# Patient Record
Sex: Female | Born: 1960 | Race: White | Hispanic: No | Marital: Married | Smoking: Current some day smoker
Health system: Southern US, Community
[De-identification: ages and names within clinical notes are randomized; demographics above are authoritative.]

## PROBLEM LIST (undated history)

## (undated) DIAGNOSIS — I214 Non-ST elevation (NSTEMI) myocardial infarction: Secondary | ICD-10-CM

## (undated) DIAGNOSIS — I480 Paroxysmal atrial fibrillation: Secondary | ICD-10-CM

## (undated) DIAGNOSIS — T7840XA Allergy, unspecified, initial encounter: Secondary | ICD-10-CM

## (undated) DIAGNOSIS — I2510 Atherosclerotic heart disease of native coronary artery without angina pectoris: Secondary | ICD-10-CM

## (undated) DIAGNOSIS — R1013 Epigastric pain: Secondary | ICD-10-CM

## (undated) DIAGNOSIS — T783XXA Angioneurotic edema, initial encounter: Secondary | ICD-10-CM

## (undated) DIAGNOSIS — J301 Allergic rhinitis due to pollen: Secondary | ICD-10-CM

## (undated) DIAGNOSIS — L509 Urticaria, unspecified: Secondary | ICD-10-CM

## (undated) DIAGNOSIS — M898X5 Other specified disorders of bone, thigh: Secondary | ICD-10-CM

## (undated) DIAGNOSIS — E785 Hyperlipidemia, unspecified: Secondary | ICD-10-CM

## (undated) DIAGNOSIS — I255 Ischemic cardiomyopathy: Secondary | ICD-10-CM

## (undated) DIAGNOSIS — J189 Pneumonia, unspecified organism: Secondary | ICD-10-CM

## (undated) DIAGNOSIS — I6529 Occlusion and stenosis of unspecified carotid artery: Secondary | ICD-10-CM

## (undated) DIAGNOSIS — K859 Acute pancreatitis without necrosis or infection, unspecified: Secondary | ICD-10-CM

## (undated) DIAGNOSIS — I714 Abdominal aortic aneurysm, without rupture, unspecified: Secondary | ICD-10-CM

## (undated) DIAGNOSIS — Z87442 Personal history of urinary calculi: Secondary | ICD-10-CM

## (undated) DIAGNOSIS — M1990 Unspecified osteoarthritis, unspecified site: Secondary | ICD-10-CM

## (undated) DIAGNOSIS — M899 Disorder of bone, unspecified: Secondary | ICD-10-CM

## (undated) DIAGNOSIS — I639 Cerebral infarction, unspecified: Secondary | ICD-10-CM

## (undated) HISTORY — DX: Allergic rhinitis due to pollen: J30.1

## (undated) HISTORY — DX: Disorder of bone, unspecified: M89.9

## (undated) HISTORY — PX: TUBAL LIGATION: SHX77

## (undated) HISTORY — DX: Ischemic cardiomyopathy: I25.5

## (undated) HISTORY — DX: Angioneurotic edema, initial encounter: T78.3XXA

## (undated) HISTORY — DX: Allergy, unspecified, initial encounter: T78.40XA

## (undated) HISTORY — DX: Atherosclerotic heart disease of native coronary artery without angina pectoris: I25.10

## (undated) HISTORY — DX: Urticaria, unspecified: L50.9

## (undated) HISTORY — DX: Hyperlipidemia, unspecified: E78.5

## (undated) HISTORY — DX: Cerebral infarction, unspecified: I63.9

## (undated) HISTORY — DX: Other specified disorders of bone, thigh: M89.8X5

## (undated) HISTORY — DX: Non-ST elevation (NSTEMI) myocardial infarction: I21.4

## (undated) HISTORY — DX: Paroxysmal atrial fibrillation: I48.0

## (undated) HISTORY — PX: CRYOABLATION: SHX1415

---
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
## 1999-05-19 ENCOUNTER — Other Ambulatory Visit: Admission: RE | Admit: 1999-05-19 | Discharge: 1999-05-19 | Payer: Self-pay | Admitting: *Deleted

## 1999-06-03 ENCOUNTER — Ambulatory Visit (HOSPITAL_COMMUNITY): Admission: RE | Admit: 1999-06-03 | Discharge: 1999-06-03 | Payer: Self-pay | Admitting: *Deleted

## 1999-06-03 ENCOUNTER — Encounter: Payer: Self-pay | Admitting: *Deleted

## 2012-04-17 ENCOUNTER — Encounter: Payer: Self-pay | Admitting: Oncology

## 2012-04-17 NOTE — Progress Notes (Signed)
[facility] is unable to fill nexvar prescription.  They faxed it to [facility] @ [phone] phone # [phone].

## 2017-08-21 ENCOUNTER — Emergency Department (HOSPITAL_COMMUNITY): Payer: Self-pay | Admitting: Anesthesiology

## 2017-08-21 ENCOUNTER — Encounter (HOSPITAL_COMMUNITY): Payer: Self-pay | Admitting: Anesthesiology

## 2017-08-21 ENCOUNTER — Inpatient Hospital Stay (HOSPITAL_COMMUNITY): Payer: Self-pay

## 2017-08-21 ENCOUNTER — Other Ambulatory Visit: Payer: Self-pay

## 2017-08-21 ENCOUNTER — Emergency Department (HOSPITAL_COMMUNITY): Payer: Self-pay

## 2017-08-21 ENCOUNTER — Encounter (HOSPITAL_COMMUNITY)
Admission: EM | Disposition: A | Discharge status: Rehabilitation Facility | Payer: Self-pay | Source: Home / Self Care | Attending: Neurology

## 2017-08-21 ENCOUNTER — Inpatient Hospital Stay (HOSPITAL_COMMUNITY)
Admission: EM | Admit: 2017-08-21 | Discharge: 2017-08-28 | DRG: 023 | Disposition: A | Discharge status: Rehabilitation Facility | Payer: Self-pay | Attending: Neurology | Admitting: Neurology

## 2017-08-21 DIAGNOSIS — R1310 Dysphagia, unspecified: Secondary | ICD-10-CM | POA: Diagnosis present

## 2017-08-21 DIAGNOSIS — D6859 Other primary thrombophilia: Secondary | ICD-10-CM | POA: Diagnosis present

## 2017-08-21 DIAGNOSIS — R29718 NIHSS score 18: Secondary | ICD-10-CM | POA: Diagnosis present

## 2017-08-21 DIAGNOSIS — G8191 Hemiplegia, unspecified affecting right dominant side: Secondary | ICD-10-CM

## 2017-08-21 DIAGNOSIS — G8101 Flaccid hemiplegia affecting right dominant side: Secondary | ICD-10-CM | POA: Diagnosis present

## 2017-08-21 DIAGNOSIS — J9600 Acute respiratory failure, unspecified whether with hypoxia or hypercapnia: Secondary | ICD-10-CM | POA: Diagnosis not present

## 2017-08-21 DIAGNOSIS — R0689 Other abnormalities of breathing: Secondary | ICD-10-CM

## 2017-08-21 DIAGNOSIS — I161 Hypertensive emergency: Secondary | ICD-10-CM | POA: Diagnosis present

## 2017-08-21 DIAGNOSIS — I10 Essential (primary) hypertension: Secondary | ICD-10-CM | POA: Diagnosis present

## 2017-08-21 DIAGNOSIS — F17210 Nicotine dependence, cigarettes, uncomplicated: Secondary | ICD-10-CM | POA: Diagnosis present

## 2017-08-21 DIAGNOSIS — D72829 Elevated white blood cell count, unspecified: Secondary | ICD-10-CM | POA: Diagnosis not present

## 2017-08-21 DIAGNOSIS — I69391 Dysphagia following cerebral infarction: Secondary | ICD-10-CM

## 2017-08-21 DIAGNOSIS — R29810 Facial weakness: Secondary | ICD-10-CM | POA: Diagnosis present

## 2017-08-21 DIAGNOSIS — I639 Cerebral infarction, unspecified: Secondary | ICD-10-CM

## 2017-08-21 DIAGNOSIS — J189 Pneumonia, unspecified organism: Secondary | ICD-10-CM

## 2017-08-21 DIAGNOSIS — I63412 Cerebral infarction due to embolism of left middle cerebral artery: Principal | ICD-10-CM | POA: Diagnosis present

## 2017-08-21 DIAGNOSIS — E871 Hypo-osmolality and hyponatremia: Secondary | ICD-10-CM | POA: Diagnosis present

## 2017-08-21 DIAGNOSIS — Z978 Presence of other specified devices: Secondary | ICD-10-CM

## 2017-08-21 DIAGNOSIS — I63512 Cerebral infarction due to unspecified occlusion or stenosis of left middle cerebral artery: Secondary | ICD-10-CM

## 2017-08-21 DIAGNOSIS — E785 Hyperlipidemia, unspecified: Secondary | ICD-10-CM | POA: Diagnosis present

## 2017-08-21 DIAGNOSIS — I63232 Cerebral infarction due to unspecified occlusion or stenosis of left carotid arteries: Secondary | ICD-10-CM | POA: Diagnosis present

## 2017-08-21 DIAGNOSIS — R4701 Aphasia: Secondary | ICD-10-CM | POA: Diagnosis present

## 2017-08-21 DIAGNOSIS — Z720 Tobacco use: Secondary | ICD-10-CM

## 2017-08-21 HISTORY — PX: IR ANGIO VERTEBRAL SEL SUBCLAVIAN INNOMINATE UNI R MOD SED: IMG5365

## 2017-08-21 HISTORY — PX: RADIOLOGY WITH ANESTHESIA: SHX6223

## 2017-08-21 HISTORY — PX: IR ANGIO INTRA EXTRACRAN SEL COM CAROTID INNOMINATE UNI R MOD SED: IMG5359

## 2017-08-21 HISTORY — PX: IR PERCUTANEOUS ART THROMBECTOMY/INFUSION INTRACRANIAL INC DIAG ANGIO: IMG6087

## 2017-08-21 LAB — COMPREHENSIVE METABOLIC PANEL
ALK PHOS: 73 U/L (ref 38–126)
ALT: 27 U/L (ref 14–54)
ANION GAP: 6 (ref 5–15)
AST: 32 U/L (ref 15–41)
Albumin: 4.5 g/dL (ref 3.5–5.0)
BILIRUBIN TOTAL: 0.4 mg/dL (ref 0.3–1.2)
BUN: 17 mg/dL (ref 6–20)
CHLORIDE: 101 mmol/L (ref 101–111)
CO2: 26 mmol/L (ref 22–32)
Calcium: 9.6 mg/dL (ref 8.9–10.3)
Creatinine, Ser: 0.69 mg/dL (ref 0.44–1.00)
GFR calc Af Amer: >60 mL/min (ref >60)
Glucose, Bld: 96 mg/dL (ref 65–99)
POTASSIUM: 3.6 mmol/L (ref 3.5–5.1)
Sodium: 133 mmol/L — ABNORMAL LOW (ref 135–145)
Total Protein: 8.3 g/dL — ABNORMAL HIGH (ref 6.5–8.1)

## 2017-08-21 LAB — I-STAT CHEM 8, ED
BUN: 18 mg/dL (ref 6–20)
CALCIUM ION: 1.21 mmol/L (ref 1.15–1.40)
CHLORIDE: 102 mmol/L (ref 101–111)
CREATININE: 0.7 mg/dL (ref 0.44–1.00)
GLUCOSE: 94 mg/dL (ref 65–99)
HCT: 52 % — ABNORMAL HIGH (ref 36.0–46.0)
Hemoglobin: 17.7 g/dL — ABNORMAL HIGH (ref 12.0–15.0)
POTASSIUM: 3.7 mmol/L (ref 3.5–5.1)
Sodium: 140 mmol/L (ref 135–145)
TCO2: 26 mmol/L (ref 22–32)

## 2017-08-21 LAB — DIFFERENTIAL
BASOS ABS: 0.1 10*3/uL (ref 0.0–0.1)
BASOS PCT: 0 %
Eosinophils Absolute: 0.1 10*3/uL (ref 0.0–0.7)
Eosinophils Relative: 1 %
LYMPHS ABS: 2.1 10*3/uL (ref 0.7–4.0)
LYMPHS PCT: 16 %
MONOS PCT: 6 %
Monocytes Absolute: 0.8 10*3/uL (ref 0.1–1.0)
NEUTROS ABS: 10.1 10*3/uL — AB (ref 1.7–7.7)
Neutrophils Relative %: 77 %

## 2017-08-21 LAB — CBC
HEMATOCRIT: 49.9 % — AB (ref 36.0–46.0)
HEMOGLOBIN: 16.2 g/dL — AB (ref 12.0–15.0)
MCH: 32 pg (ref 26.0–34.0)
MCHC: 32.5 g/dL (ref 30.0–36.0)
MCV: 98.4 fL (ref 78.0–100.0)
Platelets: 398 10*3/uL (ref 150–400)
RBC: 5.07 MIL/uL (ref 3.87–5.11)
RDW: 13.5 % (ref 11.5–15.5)
WBC: 13.2 10*3/uL — AB (ref 4.0–10.5)

## 2017-08-21 LAB — I-STAT CG4 LACTIC ACID, ED: LACTIC ACID, VENOUS: 0.65 mmol/L (ref 0.5–1.9)

## 2017-08-21 LAB — I-STAT TROPONIN, ED: TROPONIN I, POC: 0.03 ng/mL (ref 0.00–0.08)

## 2017-08-21 LAB — PROTIME-INR
INR: 0.85
Prothrombin Time: 11.5 seconds (ref 11.4–15.2)

## 2017-08-21 LAB — APTT: APTT: 41 s — AB (ref 24–36)

## 2017-08-21 LAB — ETHANOL: Alcohol, Ethyl (B): <10 mg/dL (ref <10)

## 2017-08-21 LAB — I-STAT BETA HCG BLOOD, ED (MC, WL, AP ONLY)

## 2017-08-21 SURGERY — RADIOLOGY WITH ANESTHESIA
Anesthesia: General

## 2017-08-21 MED ORDER — ROCURONIUM BROMIDE 100 MG/10ML IV SOLN
INTRAVENOUS | Status: DC | PRN
  Administered 2017-08-21 (×2): 50 mg via INTRAVENOUS

## 2017-08-21 MED ORDER — CHLORHEXIDINE GLUCONATE 0.12% ORAL RINSE (MEDLINE KIT)
15.0000 mL | Freq: Two times a day (BID) | OROMUCOSAL | Status: DC
  Administered 2017-08-22 – 2017-08-27 (×10): 15 mL via OROMUCOSAL
  Filled 2017-08-21: qty 15

## 2017-08-21 MED ORDER — ACETAMINOPHEN 325 MG PO TABS: 650.0000 mg | ORAL_TABLET | ORAL | Status: DC | PRN

## 2017-08-21 MED ORDER — ALTEPLASE (STROKE) FULL DOSE INFUSION
0.9000 mg/kg | Freq: Once | INTRAVENOUS | Status: AC
  Administered 2017-08-21: 55 mg via INTRAVENOUS

## 2017-08-21 MED ORDER — SUCCINYLCHOLINE CHLORIDE 20 MG/ML IJ SOLN
INTRAMUSCULAR | Status: DC | PRN
  Administered 2017-08-21: 120 mg via INTRAVENOUS

## 2017-08-21 MED ORDER — NICARDIPINE HCL IN NACL 20-0.86 MG/200ML-% IV SOLN
INTRAVENOUS | Status: AC
  Filled 2017-08-21: qty 200

## 2017-08-21 MED ORDER — SODIUM CHLORIDE 0.9 % IV SOLN
INTRAVENOUS | Status: DC | PRN
  Administered 2017-08-21: 20:00:00 via INTRAVENOUS

## 2017-08-21 MED ORDER — SODIUM CHLORIDE 0.9 % IV SOLN: 50.0000 mL | Freq: Once | INTRAVENOUS | Status: DC

## 2017-08-21 MED ORDER — ACETAMINOPHEN 160 MG/5ML PO SOLN: 650.0000 mg | ORAL | Status: DC | PRN

## 2017-08-21 MED ORDER — CLEVIDIPINE BUTYRATE 0.5 MG/ML IV EMUL: 0.0000 mg/h | INTRAVENOUS | Status: DC | PRN

## 2017-08-21 MED ORDER — IOPAMIDOL (ISOVUE-370) INJECTION 76%
80.0000 mL | Freq: Once | INTRAVENOUS | Status: AC | PRN
  Administered 2017-08-21: 80 mL via INTRAVENOUS

## 2017-08-21 MED ORDER — PROPOFOL 500 MG/50ML IV EMUL
INTRAVENOUS | Status: DC | PRN
  Administered 2017-08-21: 25 ug/kg/min via INTRAVENOUS

## 2017-08-21 MED ORDER — PROPOFOL 1000 MG/100ML IV EMUL
0.0000 ug/kg/min | INTRAVENOUS | Status: DC
  Administered 2017-08-21 – 2017-08-22 (×2): 50 ug/kg/min via INTRAVENOUS
  Administered 2017-08-22: 40 ug/kg/min via INTRAVENOUS
  Administered 2017-08-22: 50 ug/kg/min via INTRAVENOUS
  Administered 2017-08-23: 40 ug/kg/min via INTRAVENOUS
  Filled 2017-08-21 (×3): qty 100
  Filled 2017-08-21: qty 200
  Filled 2017-08-21: qty 100

## 2017-08-21 MED ORDER — PANTOPRAZOLE SODIUM 40 MG IV SOLR
40.0000 mg | Freq: Every day | INTRAVENOUS | Status: DC
  Administered 2017-08-22 – 2017-08-24 (×4): 40 mg via INTRAVENOUS
  Filled 2017-08-21 (×4): qty 40

## 2017-08-21 MED ORDER — STROKE: EARLY STAGES OF RECOVERY BOOK
Freq: Once | Status: AC
  Administered 2017-08-22: 1
  Filled 2017-08-21: qty 1

## 2017-08-21 MED ORDER — DOCUSATE SODIUM 50 MG/5ML PO LIQD: 100.0000 mg | Freq: Two times a day (BID) | ORAL | Status: DC | PRN

## 2017-08-21 MED ORDER — IPRATROPIUM-ALBUTEROL 0.5-2.5 (3) MG/3ML IN SOLN: 3.0000 mL | Freq: Four times a day (QID) | RESPIRATORY_TRACT | Status: DC | PRN

## 2017-08-21 MED ORDER — PROPOFOL 1000 MG/100ML IV EMUL
INTRAVENOUS | Status: AC
  Filled 2017-08-21: qty 100

## 2017-08-21 MED ORDER — ABCIXIMAB 2 MG/ML IV SOLN
10.0000 mg | INTRAVENOUS | Status: DC
  Filled 2017-08-21: qty 5

## 2017-08-21 MED ORDER — TICAGRELOR 90 MG PO TABS
180.0000 mg | ORAL_TABLET | Freq: Once | ORAL | Status: DC
  Filled 2017-08-21: qty 2

## 2017-08-21 MED ORDER — LIDOCAINE HCL (CARDIAC) 20 MG/ML IV SOLN
INTRAVENOUS | Status: DC | PRN
  Administered 2017-08-21: 100 mg via INTRATRACHEAL

## 2017-08-21 MED ORDER — ALTEPLASE 100 MG IV SOLR
INTRAVENOUS | Status: AC
  Filled 2017-08-21: qty 100

## 2017-08-21 MED ORDER — ACETAMINOPHEN 650 MG RE SUPP
650.0000 mg | RECTAL | Status: DC | PRN
  Administered 2017-08-23 (×2): 650 mg via RECTAL
  Filled 2017-08-21 (×3): qty 1

## 2017-08-21 MED ORDER — CEFAZOLIN SODIUM-DEXTROSE 2-3 GM-%(50ML) IV SOLR
INTRAVENOUS | Status: DC | PRN
  Administered 2017-08-21: 2 g via INTRAVENOUS

## 2017-08-21 MED ORDER — ORAL CARE MOUTH RINSE
15.0000 mL | Freq: Four times a day (QID) | OROMUCOSAL | Status: DC
  Administered 2017-08-22 – 2017-08-28 (×18): 15 mL via OROMUCOSAL

## 2017-08-21 MED ORDER — SODIUM CHLORIDE 0.9 % IV SOLN
INTRAVENOUS | Status: DC
  Administered 2017-08-21 – 2017-08-28 (×8): via INTRAVENOUS

## 2017-08-21 MED ORDER — BISACODYL 10 MG RE SUPP: 10.0000 mg | Freq: Every day | RECTAL | Status: DC | PRN

## 2017-08-21 MED ORDER — ONDANSETRON HCL 4 MG/2ML IJ SOLN: 4.0000 mg | Freq: Four times a day (QID) | INTRAMUSCULAR | Status: DC | PRN

## 2017-08-21 MED ORDER — ACETAMINOPHEN 650 MG RE SUPP: 650.0000 mg | RECTAL | Status: DC | PRN

## 2017-08-21 MED ORDER — NICARDIPINE HCL IN NACL 20-0.86 MG/200ML-% IV SOLN
0.0000 mg/h | INTRAVENOUS | Status: DC
  Administered 2017-08-21: 5 mg/h via INTRAVENOUS
  Administered 2017-08-22: 12.5 mg/h via INTRAVENOUS
  Administered 2017-08-22: 8 mg/h via INTRAVENOUS
  Administered 2017-08-22: 7.5 mg/h via INTRAVENOUS
  Administered 2017-08-22 (×2): 9 mg/h via INTRAVENOUS
  Administered 2017-08-22: 6 mg/h via INTRAVENOUS
  Administered 2017-08-23: 13 mg/h via INTRAVENOUS
  Administered 2017-08-23: 12.5 mg/h via INTRAVENOUS
  Administered 2017-08-23: 5 mg/h via INTRAVENOUS
  Administered 2017-08-23: 13 mg/h via INTRAVENOUS
  Filled 2017-08-21 (×12): qty 200

## 2017-08-21 MED ORDER — FENTANYL CITRATE (PF) 100 MCG/2ML IJ SOLN: 100.0000 ug | INTRAMUSCULAR | Status: DC | PRN

## 2017-08-21 MED ORDER — NITROGLYCERIN 1 MG/10 ML FOR IR/CATH LAB
INTRA_ARTERIAL | Status: AC | PRN
  Administered 2017-08-21 (×4): 25 ug via INTRA_ARTERIAL

## 2017-08-21 MED ORDER — PROPOFOL 10 MG/ML IV BOLUS
INTRAVENOUS | Status: DC | PRN
  Administered 2017-08-21: 200 mg via INTRAVENOUS
  Administered 2017-08-21: 40 mg via INTRAVENOUS

## 2017-08-21 MED ORDER — PHENYLEPHRINE HCL 10 MG/ML IJ SOLN
INTRAMUSCULAR | Status: DC | PRN
  Administered 2017-08-21: 25 ug/min via INTRAVENOUS

## 2017-08-21 MED ORDER — SODIUM CHLORIDE 0.9 % IV SOLN: INTRAVENOUS | Status: DC

## 2017-08-21 MED ORDER — FENTANYL CITRATE (PF) 250 MCG/5ML IJ SOLN
INTRAMUSCULAR | Status: DC | PRN
  Administered 2017-08-21 (×2): 100 ug via INTRAVENOUS

## 2017-08-21 NOTE — Sedation Documentation (Signed)
Reopro delivered to the IR suite.

## 2017-08-21 NOTE — Sedation Documentation (Signed)
Dr. Estanislado Pandy requesting Reopro 10mg /10ml. Called Pharmacy and spoke with Clarene Critchley, PhD. She states that there in none available on this campus, but there is one vital on the [place]. She states that she will go to [facility] and get it and that it will take a minimum of 30 mins to get it to the IR suite.

## 2017-08-21 NOTE — ED Notes (Signed)
No initial vitals .  Pt straight to ct before room

## 2017-08-21 NOTE — ED Notes (Signed)
Pt arrival via EMS.  Dr. Alvino Chapel met pt in hallway.  Pt not moving right side.  Unable to gaze to the right side.  Speech garbled.  Pt straight to CT before going to room.  CBG 105.  Per EMS pt has no history and no meds.  LKW was 1530 today.  Pt found in the floor at 1715.  PT did c/o headache this morning.

## 2017-08-21 NOTE — H&P (Signed)
Neurology H&P   CC: Right-sided weakness  History is obtained from: Husband  HPI: [name] is a 56 y.o. female with no significant past medical history (has not been to a physician in 15 years) who presents with right-sided weakness that started sometime between 3:15 and 5:15 PM.   Her husband left her around 3:15 PM and then her brother found her on the floor at 5:15 PM.  She was taken to any pain hospital where she was administered IV TPA.  CTA was performed which demonstrated  LKW: 3:15 PM tpa given?:  Yes Modified Rankin Score: 0  ROS: A 14 point ROS was performed and is negative except as noted in the HPI.   Family history: Unable to obtain due to altered mental status  Social History: Husband states that she smokes, does not drink  Exam: Current vital signs: BP (!) 144/71   Pulse 86   Temp 98.4 F (36.9 C)   Resp 15   Ht 5\' 2"  (1.575 m)   Wt 60.8 kg (134 lb)   LMP  (LMP Unknown)   SpO2 94%   BMI 24.51 kg/m  Vital signs in last 24 hours: Temp:  [98.4 F (36.9 C)] 98.4 F (36.9 C) (11/26 1929) Pulse Rate:  [86-99] 86 (11/26 1929) Resp:  [13-22] 15 (11/26 1929) BP: (129-157)/(71-83) 144/71 (11/26 1929) SpO2:  [94 %-98 %] 94 % (11/26 1929) Weight:  [60.8 kg (134 lb)] 60.8 kg (134 lb) (11/26 1901)  Physical Exam  Constitutional: Appears well-developed and well-nourished.  Psych: Affect appropriate to situation Eyes: No scleral injection HENT: No OP obstrucion Head: Normocephalic.  Cardiovascular: Normal rate and regular rhythm.  Respiratory: Effort normal and breath sounds normal to anterior ascultation GI: Soft.  No distension. There is no tenderness.  Skin: WDI  Neuro: Mental Status: Patient is awake, alert, she is able to tell me her name, but not answer any other questions other than yes/no questions.  This the answers that she gets to yes/no questions are of dubious reliability as well.   Cranial Nerves: II: Does not blink to threat from the  right.  Pupils are equal, round, and reactive to light.   III,IV, VI: Left gaze preference V: Facial sensation is symmetric (dubious reliability) VII: Facial movement is weak on the right Motor: She has a flaccid right hemiparesis, with no movement in the right arm in flexion to noxious stimuli in the right leg.  She moves her left side voluntarily with full strength. Sensory: She does flinch to noxious stimuli, but this is reduced on the right side. 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  She received IV TPA at [facility].  She is within the 6-hour window for IR intervention, and she is being taken directly to the Cath Lab.  Recommendations: 1.  Attempt for mechanical thrombectomy 2. MRI  of the brain without contrast 3. Frequent neuro checks 4. Echocardiogram 5. HgbA1c, fasting lipid panel 6. Prophylactic therapy-none for 24 hours unless stent is used 7. Risk factor modification 8. Telemetry monitoring 9. PT consult, OT consult, Speech consult 10. please page stroke NP  Or  PA  Or MD  from 8am -4 pm as this patient will be followed by the  stroke team at this point.   You can look them up on [email or web address]      This patient is critically ill and at significant risk of neurological worsening, death and care requires constant monitoring of vital signs, hemodynamics,respiratory and cardiac monitoring, neurological assessment, discussion with family, other specialists and medical decision making of high complexity. I spent 50 minutes of neurocritical care time  in the care of  this patient.  Roland Rack, MD [facility] [phone]  If 7pm- 7am, please  page neurology on call as listed in [place]. 08/21/2017  9:25 PM

## 2017-08-21 NOTE — Sedation Documentation (Signed)
Pt taken to Room 4N22 on stretcher with monitor, RN, CRNA and RT. Groin and pulses assessed at bedside with receiving RN, Rich. See flowsheet.

## 2017-08-21 NOTE — Sedation Documentation (Signed)
Ct tech unable to find order for non contrast heat CT. Sherita in patient placement called to help with encounter. She could not find a problem. She could see all of the orders. I spoke with the registation clerk in the ED, Draius, to help with resolving  the problem and he was unable to help. Pt had head CT without and order by the direction of Dr. Estanislado Pandy and he said he would assume all responsibility for doing so.

## 2017-08-21 NOTE — Transfer of Care (Signed)
Immediate Anesthesia Transfer of Care Note  Patient: [name]  Procedure(s) Performed: RADIOLOGY WITH ANESTHESIA (N/A )  Patient Location: PACU  Anesthesia Type:General  Level of Consciousness: sedated  Airway & Oxygen Therapy: Patient remains intubated per anesthesia plan and Patient placed on Ventilator (see vital sign flow sheet for setting)  Post-op Assessment: Report given to RN and Post -op Vital signs reviewed and stable  Post vital signs: Reviewed and stable  Last Vitals:  Vitals:   08/21/17 1945 08/21/17 2000  BP: 131/72 (!) 145/85  Pulse: (!) 104 97  Resp: 17 16  Temp: (!) 36 C (!) 36.1 C  SpO2: 96% 97%    Last Pain:  Vitals:   08/21/17 2000  TempSrc: Oral         Complications: No apparent anesthesia complications

## 2017-08-21 NOTE — Progress Notes (Signed)
Patient transported on vent from CT to 0K-34 without complication.

## 2017-08-21 NOTE — Progress Notes (Signed)
CALL FROM RN Purcell EXAM FINISHED 1829 IMAGES SENT TO Effort 1830 EXAM COMPLETED IN Epic CALLED [place] [phone]

## 2017-08-21 NOTE — Anesthesia Procedure Notes (Signed)
Procedure Name: Intubation Date/Time: 08/21/2017 8:24 PM Performed by: Clovis Cao, CRNA Pre-anesthesia Checklist: Patient identified, Emergency Drugs available, Suction available, Patient being monitored and Timeout performed Patient Re-evaluated:Patient Re-evaluated prior to induction Oxygen Delivery Method: Circle system utilized Preoxygenation: Pre-oxygenation with 100% oxygen Induction Type: IV induction, Rapid sequence and Cricoid Pressure applied Laryngoscope Size: McGraph and 3 (Elective Mc Grath) Grade View: Grade I Tube type: Subglottic suction tube Tube size: 7.5 mm Number of attempts: 1 Airway Equipment and Method: Stylet Placement Confirmation: ETT inserted through vocal cords under direct vision,  positive ETCO2 and breath sounds checked- equal and bilateral Secured at: 22 cm Tube secured with: Tape Dental Injury: Teeth and Oropharynx as per pre-operative assessment

## 2017-08-21 NOTE — Consult Note (Signed)
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 The risks/benefits/alternatives of IV tpa, including a 6 percent risk of brain bleed and 3 percent risk of death, was reviewed with the patient's husband, and he consented to IV tpa. Patient will be transferred to [facility] for potential endovascular treatment - discussed with Dr. Leonel Ramsay.  Differential Diagnosis:  1. Cardioembolic stroke  2. Small vessel disease/lacune  3. Thromboembolic, artery-to-artery mechanism  4. Hypercoagulable state-related infarct  5. Transient ischemic attack  6. Thrombotic mechanism, large artery disease   Comments:   Door time:  1830 [facility] contacted: [phone] [facility] at bedside: 1856 NIHSS assessment time: 1903 Verbal tpa order: 1905 Needle time: 1918  Verbal Consent to tPA:  I have explained to the patient/family/guardian the nature of the patient's condition, the use of tPA fibrinolytic agent, and the benefits to be reasonably expected compared with alternative approaches. I have discussed the likelihood of major risks or complications of this procedure including (if applicable) but not limited to loss of limb function, brain damage, paralysis, hemorrhage, infection, complications from transfusion of blood components, drug reactions, blood clots and loss of life. I have also indicated that with any procedure there is always the possibility of an unexpected complication. I have explained the risks which include:    1. Death, Stroke or permanent neurologic injury (paralysis, coma, etc)   2. Worsening of stroke symptoms from swelling or bleeding in the brain   3. Bleeding in other parts of the body   4. Need for blood transfusions to replace blood or clotting factors   5. Allergic reaction to medications   6. Other unexpected complications    All questions were answered and the patient/family/guardian express understanding  of the treatment plan and consent to the procedure.  Our recommendations are outlined below.   We will be seeing the patient back in follow up as noted.    Recommendations:   IV tPA - dose = 5.5 mg bolus; 49.3 mg infusion Routine post tPA monitoring including neuro checks and blood pressure control during/after treatment Monitor blood pressure Check blood pressure and NIHSS every 15 min for 2 h, then every 30 min for 6 h, and finally every hour for 16 h  Systolic greater than 836 OR diastolic greater than 629: Option 1: Labetalol 10 mg IV for 1 - 2 min May repeat or double labetalol every 10 min to maximum dose of 300 mg, or give initial labetalol dose, then start labetalol drip at 2 - 8 mg/min. Option 2: Nicardipine 5 mg/h IV infusion as initial dose and titrate to desired effect by increasing 2.5 mg/h every 5 min to maximum of 15 mg/h;  If blood pressure is not controlled by labetolol or nicardipine, consider sodium nitroprusside.  Admission to ICU CT brain 24 hours post tPA NPO until swallowing screen performed and passed No antiplatelet agents or anticoagulants (including heparin for DVT prophylaxis) in first 24 hours No Foley catheter, nasogastric tube, arterial catheter or central venous catheter for 24 hr, unless absolutely necessary Telemetry  Inpatient Neurology Consultation Stroke evaluation as per inpatient neurology recommendations Discussed with ED MD  -----------------------------------------------------------------------------------------  CC right weakness and difficulty speaking.  History of Present Illness   Patient is a 56 yo woman with acute onset right weakness and difficulty speaking. She was LSN at approx 1530 and she was found at approx 1730. No LOC/convulsion.   Diagnostic: CT head neg for hemorrhage.  Exam:  RESULT SUMMARY: 18 points NIH Stroke Scale   INPUTS: 1A: Level of consciousness -> 0 = Alert; keenly responsive 1B: Ask month and age -> 2  = 0 questions right  1C: 'Blink eyes' & 'squeeze hands' -> 1 = Performs 1 task 2: Horizontal extraocular movements -> 0 = Normal 3: Visual fields -> 0 = No visual loss 4: Facial palsy -> 2 = Partial paralysis (lower face) 5A: Left arm motor drift -> 0 = No drift for 10 seconds 5B: Right arm motor drift -> 4 = No movement 6A: Left leg motor drift -> 0 = No drift for 5 seconds 6B: Right leg motor drift -> 4 = No movement 7: Limb Ataxia -> 0 = No ataxia 8: Sensation -> 0 = Normal; no sensory loss 9: Language/aphasia -> 3 = Mute/global aphasia: no usable speech/auditory comprehension 10: Dysarthria -> 2 = Mute/anarthric 11: Extinction/inattention -> 0 = No abnormality    Medical Decision Making:  - Extensive number of diagnosis or management options are considered above.   - Extensive amount of complex data reviewed.   - High risk of complication and/or morbidity or mortality are associated with differential diagnostic considerations above.  - There may be Uncertain outcome and increased probability of prolonged functional impairment or high probability of severe prolonged functional impairment associated with some of these differential diagnosis.  Medical Data Reviewed:  1.Data reviewed include clinical labs, radiology,  Medical Tests;   2.Tests results discussed w/performing or interpreting physician;   3.Obtaining/reviewing old medical records;  4.Obtaining case history from another source;  5.Independent review of image, tracing or specimen.    Patient was informed the Neurology Consult would happen via telehealth (remote video) and consented to receiving care in this manner.

## 2017-08-21 NOTE — Sedation Documentation (Signed)
Procedure end

## 2017-08-21 NOTE — ED Notes (Signed)
Pt arrived in IR at [facility]. Care transferred at this time.

## 2017-08-21 NOTE — ED Triage Notes (Signed)
Pt found in the floor with right side flaccid and unable to talk Last seen normal at 1530

## 2017-08-21 NOTE — ED Provider Notes (Signed)
[facility] EMERGENCY DEPARTMENT Provider Note   CSN: [id number] Arrival date & time: 08/21/17  1830     History   Chief Complaint Chief Complaint  Patient presents with  . Code Stroke    HPI [name] is a 56 y.o. female.  HPI Level 5 caveat due to nonverbal status.  This patient was brought in a code stroke.  Last seen normal by her husband at 46 when he went out hunting.  Came back and found her on the floor.  Not moving her right side and not speaking.  He came home around 515.  Around 545 EMS was called.  I met her upon arrival and she is minimally verbal and will questionably follow any commands.  Neglect of the right side and flaccid on the right.  Reportedly no significant medical history.  CBG was read around 100. No past medical history on file.  There are no active problems to display for this patient.   Patient is nonverbal and unable to get history from her.  OB History    No data available       Home Medications    Prior to Admission medications   Not on File    Family History No family history on file.  Social History Social History   Tobacco Use  . Smoking status: Not on file  Substance Use Topics  . Alcohol use: Not on file  . Drug use: Not on file     Allergies   Patient has no known allergies.   Review of Systems Review of Systems  Unable to perform ROS: Patient nonverbal     Physical Exam Updated Vital Signs BP (!) 141/76   Pulse 95   Temp 98.4 F (36.9 C) (Oral)   Resp 18   Ht 5' 2"  (1.575 m)   Wt 60.8 kg (134 lb)   LMP  (LMP Unknown)   SpO2 98%   BMI 24.51 kg/m   Physical Exam  Constitutional: She appears well-developed.  HENT:  Head: Normocephalic.  Neck: Neck supple.  Cardiovascular: Normal rate.  Pulmonary/Chest: Effort normal. She has no wheezes.  Abdominal: There is no tenderness.  Musculoskeletal: She exhibits no edema.  Neurological:  Patient is awake and breathing spontaneously.  Moving  left side spontaneously but flaccid on right.  Neglect of right side and will not look past the midline.  Will say a few words but will only question will follow commands.  Would not touch her face to command but did raise her arm to command.  Did move toes on left side.  Right-sided facial droop.  Skin: Skin is warm. Capillary refill takes less than 2 seconds.     ED Treatments / Results  Labs (all labs ordered are listed, but only abnormal results are displayed) Labs Reviewed  APTT - Abnormal; Notable for the following components:      Result Value   aPTT 41 (*)    All other components within normal limits  CBC - Abnormal; Notable for the following components:   WBC 13.2 (*)    Hemoglobin 16.2 (*)    HCT 49.9 (*)    All other components within normal limits  DIFFERENTIAL - Abnormal; Notable for the following components:   Neutro Abs 10.1 (*)    All other components within normal limits  COMPREHENSIVE METABOLIC PANEL - Abnormal; Notable for the following components:   Sodium 133 (*)    Total Protein 8.3 (*)    All other  components within normal limits  I-STAT CHEM 8, ED - Abnormal; Notable for the following components:   Hemoglobin 17.7 (*)    HCT 52.0 (*)    All other components within normal limits  ETHANOL  PROTIME-INR  RAPID URINE DRUG SCREEN, HOSP PERFORMED  URINALYSIS, ROUTINE W REFLEX MICROSCOPIC  I-STAT TROPONIN, ED  I-STAT BETA HCG BLOOD, ED ([facility], [facility], [facility] ONLY)  I-STAT CG4 LACTIC ACID, ED    EKG  EKG Interpretation  Date/Time:  Monday August 21 2017 18:50:19 EST Ventricular Rate:  99 PR Interval:    QRS Duration: 86 QT Interval:  373 QTC Calculation: 479 R Axis:   84 Text Interpretation:  Sinus rhythm Consider right atrial enlargement Borderline repolarization abnormality Baseline wander in lead(s) V6 Confirmed by Davonna Belling [phone]) on 08/21/2017 7:28:17 PM       Radiology Ct Angio Head W Or Wo Contrast  Result Date: 08/21/2017 CLINICAL DATA:   Left MCA syndrome.  Abnormal head CT. EXAM: CT ANGIOGRAPHY HEAD AND NECK TECHNIQUE: Multidetector CT imaging of the head and neck was performed using the standard protocol during bolus administration of intravenous contrast. Multiplanar CT image reconstructions and MIPs were obtained to evaluate the vascular anatomy. Carotid stenosis measurements (when applicable) are obtained utilizing NASCET criteria, using the distal internal carotid diameter as the denominator. CONTRAST:  71m ISOVUE-370 IOPAMIDOL (ISOVUE-370) INJECTION 76% COMPARISON:  None. FINDINGS: CTA NECK FINDINGS Aortic arch: Extensive atheromatous thickening and calcification of the arch, especially for age. Low-density plaque or luminal thrombus seen at the isthmus. There is degradation by motion artifact. Right carotid system: Atheromatous plaque on the ventral distal common carotid. There is mildly irregular plaque at the ICA bulb with 40% proximal ICA stenosis. Negative for dissection. Left carotid system: Motion affects visualization of the proximal common carotid. There is atheromatous wall thickening of the ventral distal common carotid wall. Plaque at the common carotid bifurcation and ICA bulb with severe stenosis at the ICA bulb, with downstream underfilling. This is a likely source of embolism. No superimposed dissection. Vertebral arteries: Motion affects visualization of the proximal subclavian artery on the left. There is likely mild ostial stenosis. No visible left vertebral flow until the V2 segment. There is strong dominance of the right vertebral artery without superimposed flow limiting stenosis. There is scattered atherosclerotic plaque on the right vertebral artery. Skeleton: No acute or aggressive finding. Other neck: No incidental mass or adenopathy. Upper chest: No acute finding Review of the MIP images confirms the above findings CTA HEAD FINDINGS Anterior circulation: Faint flow seen in the left proximal intracranial ICA, with  none by the level of the anterior genu. Occluded left MCA with distal opacification beginning at the M2/3 level. There is a robust anterior communicating artery and good bilateral A2 and distal flow. No right-sided branch occlusion is seen. Posterior circulation: Diminutive left vertebral artery, especially before the PICA. The distal left V4 segment may be flow in retrograde. Negative for branch occlusion or flow limiting stenosis. Venous sinuses: As permitted by contrast timing, patent. Anatomic variants: None significant Delayed phase: Not performed in the emergent setting. See head CT for record of communication. Review of the MIP images confirms the above findings IMPRESSION: 1. Positive for emergent large vessel occlusion. No flow seen in the left M1 and distal intracranial LICA. There is flow seen in the left M2/3 branches suggesting favorable collaterals. Aspects scored on prior noncontrast head CT. 2. Severe atheromatous stenosis at the left ICA bulb which may be an embolic  source. There is comparatively poor flow between the stenosis and intracranial occlusion. 3. ~ 40% right ICA bulb atheromatous narrowing. 4. 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 There is good flow in the distal left V4 segment and left PICA, which may be retrograde. 5. Advanced atherosclerosis of the aortic arch with irregular plaque/thrombus at the isthmus. Motion affects visualization of the great vessel ostia. Electronically Signed   By: Monte Fantasia M.D.   On: 08/21/2017 19:03   Ct Angio Neck W Or Wo Contrast  Result Date: 08/21/2017 CLINICAL DATA:  Left MCA syndrome.  Abnormal head CT. EXAM: CT ANGIOGRAPHY HEAD AND NECK TECHNIQUE: Multidetector CT imaging of the head and neck was performed using the standard protocol during bolus administration of intravenous contrast. Multiplanar CT image reconstructions and MIPs were obtained to evaluate the vascular anatomy. Carotid stenosis  measurements (when applicable) are obtained utilizing NASCET criteria, using the distal internal carotid diameter as the denominator. CONTRAST:  63m ISOVUE-370 IOPAMIDOL (ISOVUE-370) INJECTION 76% COMPARISON:  None. FINDINGS: CTA NECK FINDINGS Aortic arch: Extensive atheromatous thickening and calcification of the arch, especially for age. Low-density plaque or luminal thrombus seen at the isthmus. There is degradation by motion artifact. Right carotid system: Atheromatous plaque on the ventral distal common carotid. There is mildly irregular plaque at the ICA bulb with 40% proximal ICA stenosis. Negative for dissection. Left carotid system: Motion affects visualization of the proximal common carotid. There is atheromatous wall thickening of the ventral distal common carotid wall. Plaque at the common carotid bifurcation and ICA bulb with severe stenosis at the ICA bulb, with downstream underfilling. This is a likely source of embolism. No superimposed dissection. Vertebral arteries: Motion affects visualization of the proximal subclavian artery on the left. There is likely mild ostial stenosis. No visible left vertebral flow until the V2 segment. There is strong dominance of the right vertebral artery without superimposed flow limiting stenosis. There is scattered atherosclerotic plaque on the right vertebral artery. Skeleton: No acute or aggressive finding. Other neck: No incidental mass or adenopathy. Upper chest: No acute finding Review of the MIP images confirms the above findings CTA HEAD FINDINGS Anterior circulation: Faint flow seen in the left proximal intracranial ICA, with none by the level of the anterior genu. Occluded left MCA with distal opacification beginning at the M2/3 level. There is a robust anterior communicating artery and good bilateral A2 and distal flow. No right-sided branch occlusion is seen. Posterior circulation: Diminutive left vertebral artery, especially before the PICA. The distal  left V4 segment may be flow in retrograde. Negative for branch occlusion or flow limiting stenosis. Venous sinuses: As permitted by contrast timing, patent. Anatomic variants: None significant Delayed phase: Not performed in the emergent setting. See head CT for record of communication. Review of the MIP images confirms the above findings IMPRESSION: 1. Positive for emergent large vessel occlusion. No flow seen in the left M1 and distal intracranial LICA. There is flow seen in the left M2/3 branches suggesting favorable collaterals. Aspects scored on prior noncontrast head CT. 2. Severe atheromatous stenosis at the left ICA bulb which may be an embolic source. There is comparatively poor flow between the stenosis and intracranial occlusion. 3. ~ 40% right ICA bulb atheromatous narrowing. 4. 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 There is good flow in the distal left V4 segment and left PICA, which may be retrograde. 5. Advanced atherosclerosis of the aortic arch with irregular plaque/thrombus at  the isthmus. Motion affects visualization of the great vessel ostia. Electronically Signed   By: Monte Fantasia M.D.   On: 08/21/2017 19:03   Ct Head Code Stroke Wo Contrast  Result Date: 08/21/2017 CLINICAL DATA:  Code stroke.  Code stroke.  Right-sided weakness. EXAM: CT HEAD WITHOUT CONTRAST TECHNIQUE: Contiguous axial images were obtained from the base of the skull through the vertex without intravenous contrast. COMPARISON:  None. FINDINGS: Brain: There is low-density in the posterior left putamen and posterior left insula. Small area of left parietal cortex blurring. Negative for hemorrhage. No masslike finding or hydrocephalus. No prior injury is noted. Vascular: Hyperdense left distal ICA and MCA. Atherosclerotic calcification. Skull: No acute finding. Sinuses/Orbits: Gaze to the left. Other: Critical Value/emergent results were called by telephone at the time of  interpretation on 08/21/2017 at 6:43 pm to Dr. Davonna Belling , who verbally acknowledged these results. ASPECTS [facility] Stroke Program Early CT Score) - Ganglionic level infarction (caudate, lentiform nuclei, internal capsule, insula, M1-M3 cortex): 5 - Supraganglionic infarction (M4-M6 cortex): 2 Total score (0-10 with 10 being normal): 7 IMPRESSION: Hyperdense distal left ICA and M1 segment consistent with thrombus in this setting. Small acute infarcts seen in the left insula, left putamen, and possibly left parietal cortex ASPECTS is 7 to 8. Electronically Signed   By: Monte Fantasia M.D.   On: 08/21/2017 18:46    Procedures Procedures (including critical care time)  Medications Ordered in ED Medications  alteplase (ACTIVASE) 1 mg/mL injection (not administered)  alteplase (ACTIVASE) 1 mg/mL infusion 55 mg (55 mg Intravenous New Bag/Given 08/21/17 1918)    Followed by  0.9 %  sodium chloride infusion (not administered)  iopamidol (ISOVUE-370) 76 % injection 80 mL (80 mLs Intravenous Contrast Given 08/21/17 1839)     Initial Impression / Assessment and Plan / ED Course  I have reviewed the triage vital signs and the nursing notes.  Pertinent labs & imaging results that were available during my care of the patient were reviewed by me and considered in my medical decision making (see chart for details).     Patient presented as a code stroke.  Worrisome for large vessel occlusion with the deficits.  CT and CTA done emergently.  Shows large vessel occlusion.  Seen by specialist on-call neurologist.  IV TPA given.  Complete NIH scoring done by neurology.  Will transfer down to [facility] for possible vascular intervention.  I discussed with Dr. Jeneen Rinks and Dr. Leonel Ramsay.   CRITICAL CARE Performed by: Davonna Belling Total critical care time: 36mnutes Critical care time was exclusive of separately billable procedures and treating other patients. Critical care was necessary  to treat or prevent imminent or life-threatening deterioration. Critical care was time spent personally by me on the following activities: development of treatment plan with patient and/or surrogate as well as nursing, discussions with consultants, evaluation of patient's response to treatment, examination of patient, obtaining history from patient or surrogate, ordering and performing treatments and interventions, ordering and review of laboratory studies, ordering and review of radiographic studies, pulse oximetry and re-evaluation of patient's condition.   Final Clinical Impressions(s) / ED Diagnoses   Final diagnoses:  Cerebrovascular accident (CVA) due to occlusion of left middle cerebral artery ([facility]    ED Discharge Orders    None       PDavonna Belling MD 08/21/17 1929

## 2017-08-21 NOTE — Procedures (Signed)
S/P bilateral common carotid and RT vert arteriograms,followed by complete revascularization of occluded Lt ICA terminus ,Lt MCA M! And M2 segs with x1 pass with solitaire 33mm x 40 mm FR  retrieval device achieving TICI 3 repetfusion.

## 2017-08-21 NOTE — Sedation Documentation (Signed)
Anesthesia case 

## 2017-08-21 NOTE — Anesthesia Procedure Notes (Signed)
Arterial Line Insertion Start/End11/26/2018 8:15 PM, 08/21/2017 8:22 PM Performed by: Clovis Cao, CRNA, CRNA  Preanesthetic checklist: patient identified, IV checked, site marked, risks and benefits discussed, surgical consent, monitors and equipment checked, pre-op evaluation and timeout performed Left, radial was placed Catheter size: 20 G Hand hygiene performed  and maximum sterile barriers used  Allen's test indicative of satisfactory collateral circulation Attempts: 1 Procedure performed without using ultrasound guided technique. Ultrasound Notes:anatomy identified, needle tip was noted to be adjacent to the nerve/plexus identified and no ultrasound evidence of intravascular and/or intraneural injection Following insertion, dressing applied. Patient tolerated the procedure well with no immediate complications.

## 2017-08-21 NOTE — Consult Note (Signed)
PULMONARY / CRITICAL CARE MEDICINE   Name: [name] MRN: [id number] DOB: 1960/11/22    ADMISSION DATE:  08/21/2017 CONSULTATION DATE:  08/21/2017  REFERRING MD:  Dr. Leonel Ramsay   CHIEF COMPLAINT:  CVA  HISTORY OF PRESENT ILLNESS:   56 year old right handed female with no significant PMH/ has not seen physican in 15 years who presented to [facility] with acute onset of aphasia, right sided weakness and left gaze 11/26.  LSW at 1515.  CT head negative for ICH however CTA showed left MCA and left ICA occlusion.  TPA given at [place]. Transferred to [facility] for endovascular revascularization.  Intubated for procedure.  CCM consulted for ventilator management.   PAST MEDICAL HISTORY :  She  has no past medical history on file.  PAST SURGICAL HISTORY: She  has no past surgical history on file.  No Known Allergies  No current facility-administered medications on file prior to encounter.    No current outpatient medications on file prior to encounter.    FAMILY HISTORY:  Her has no family status information on file.    SOCIAL HISTORY: Unknown  REVIEW OF SYSTEMS:   Unable to complete as patient is intubated and sedated on mechanical ventilation.  SUBJECTIVE:  On propofol at 150 mcg/kg/min  VITAL SIGNS: BP (!) 148/91   Pulse 100   Temp (!) 97 F (36.1 C) (Oral)   Resp 18   Ht 5\' 2"  (1.575 m)   Wt 134 lb (60.8 kg)   LMP  (LMP Unknown)   SpO2 100%   BMI 24.51 kg/m   HEMODYNAMICS:    VENTILATOR SETTINGS: Vent Mode: PRVC FiO2 (%):  [100 %] 100 % Set Rate:  [16 bmp] 16 bmp Vt Set:  [400 mL] 400 mL PEEP:  [5 cmH20] 5 cmH20 Plateau Pressure:  [16 cmH20] 16 cmH20  PRVC TV 400, RR 16, peep 5, FiO2 80% PIP 19  INTAKE / OUTPUT: No intake/output data recorded.  PHYSICAL EXAMINATION: General:  Well nourished adult female, sedated on MV HEENT: MM pink/moist, ETT/ OGT, pupils 3/ sluggish Neuro: Sedated, no response CV:  rrr, no m/r/g, faint DP pulses, left groin  site level 0 PULM: even/non-labored on MV, lungs bilaterally clear GI: sofft, bs active  Extremities: cool/dry, no edema  Skin: no rashes, small bruising to right knee  LABS:  BMET Recent Labs  Lab 08/21/17 1829 08/21/17 1843  NA 133* 140  K 3.6 3.7  CL 101 102  CO2 26  --   BUN 17 18  CREATININE 0.69 0.70  GLUCOSE 96 94    Electrolytes Recent Labs  Lab 08/21/17 1829  CALCIUM 9.6    CBC Recent Labs  Lab 08/21/17 1829 08/21/17 1843  WBC 13.2*  --   HGB 16.2* 17.7*  HCT 49.9* 52.0*  PLT 398  --     Coag's Recent Labs  Lab 08/21/17 1829  APTT 41*  INR 0.85    Sepsis Markers Recent Labs  Lab 08/21/17 1839  LATICACIDVEN 0.65    ABG No results for input(s): PHART, PCO2ART, PO2ART in the last 168 hours.  Liver Enzymes Recent Labs  Lab 08/21/17 1829  AST 32  ALT 27  ALKPHOS 73  BILITOT 0.4  ALBUMIN 4.5    Cardiac Enzymes No results for input(s): TROPONINI, PROBNP in the last 168 hours.  Glucose No results for input(s): GLUCAP in the last 168 hours.  Imaging Ct Angio Head W Or Wo Contrast  Result Date: 08/21/2017 CLINICAL DATA:  Left MCA syndrome.  Abnormal head CT. EXAM: CT ANGIOGRAPHY HEAD AND NECK TECHNIQUE: Multidetector CT imaging of the head and neck was performed using the standard protocol during bolus administration of intravenous contrast. Multiplanar CT image reconstructions and MIPs were obtained to evaluate the vascular anatomy. Carotid stenosis measurements (when applicable) are obtained utilizing NASCET criteria, using the distal internal carotid diameter as the denominator. CONTRAST:  70mL ISOVUE-370 IOPAMIDOL (ISOVUE-370) INJECTION 76% COMPARISON:  None. FINDINGS: CTA NECK FINDINGS Aortic arch: Extensive atheromatous thickening and calcification of the arch, especially for age. Low-density plaque or luminal thrombus seen at the isthmus. There is degradation by motion artifact. Right carotid system: Atheromatous plaque on the  ventral distal common carotid. There is mildly irregular plaque at the ICA bulb with 40% proximal ICA stenosis. Negative for dissection. Left carotid system: Motion affects visualization of the proximal common carotid. There is atheromatous wall thickening of the ventral distal common carotid wall. Plaque at the common carotid bifurcation and ICA bulb with severe stenosis at the ICA bulb, with downstream underfilling. This is a likely source of embolism. No superimposed dissection. Vertebral arteries: Motion affects visualization of the proximal subclavian artery on the left. There is likely mild ostial stenosis. No visible left vertebral flow until the V2 segment. There is strong dominance of the right vertebral artery without superimposed flow limiting stenosis. There is scattered atherosclerotic plaque on the right vertebral artery. Skeleton: No acute or aggressive finding. Other neck: No incidental mass or adenopathy. Upper chest: No acute finding Review of the MIP images confirms the above findings CTA HEAD FINDINGS Anterior circulation: Faint flow seen in the left proximal intracranial ICA, with none by the level of the anterior genu. Occluded left MCA with distal opacification beginning at the M2/3 level. There is a robust anterior communicating artery and good bilateral A2 and distal flow. No right-sided branch occlusion is seen. Posterior circulation: Diminutive left vertebral artery, especially before the PICA. The distal left V4 segment may be flow in retrograde. Negative for branch occlusion or flow limiting stenosis. Venous sinuses: As permitted by contrast timing, patent. Anatomic variants: None significant Delayed phase: Not performed in the emergent setting. See head CT for record of communication. Review of the MIP images confirms the above findings IMPRESSION: 1. Positive for emergent large vessel occlusion. No flow seen in the left M1 and distal intracranial LICA. There is flow seen in the left  M2/3 branches suggesting favorable collaterals. Aspects scored on prior noncontrast head CT. 2. Severe atheromatous stenosis at the left ICA bulb which may be an embolic source. There is comparatively poor flow between the stenosis and intracranial occlusion. 3. ~ 40% right ICA bulb atheromatous narrowing. 4. 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 There is good flow in the distal left V4 segment and left PICA, which may be retrograde. 5. Advanced atherosclerosis of the aortic arch with irregular plaque/thrombus at the isthmus. Motion affects visualization of the great vessel ostia. Electronically Signed   By: Monte Fantasia M.D.   On: 08/21/2017 19:03   Ct Angio Neck W Or Wo Contrast  Result Date: 08/21/2017 CLINICAL DATA:  Left MCA syndrome.  Abnormal head CT. EXAM: CT ANGIOGRAPHY HEAD AND NECK TECHNIQUE: Multidetector CT imaging of the head and neck was performed using the standard protocol during bolus administration of intravenous contrast. Multiplanar CT image reconstructions and MIPs were obtained to evaluate the vascular anatomy. Carotid stenosis measurements (when applicable) are obtained utilizing NASCET criteria, using the distal  internal carotid diameter as the denominator. CONTRAST:  58mL ISOVUE-370 IOPAMIDOL (ISOVUE-370) INJECTION 76% COMPARISON:  None. FINDINGS: CTA NECK FINDINGS Aortic arch: Extensive atheromatous thickening and calcification of the arch, especially for age. Low-density plaque or luminal thrombus seen at the isthmus. There is degradation by motion artifact. Right carotid system: Atheromatous plaque on the ventral distal common carotid. There is mildly irregular plaque at the ICA bulb with 40% proximal ICA stenosis. Negative for dissection. Left carotid system: Motion affects visualization of the proximal common carotid. There is atheromatous wall thickening of the ventral distal common carotid wall. Plaque at the common carotid bifurcation  and ICA bulb with severe stenosis at the ICA bulb, with downstream underfilling. This is a likely source of embolism. No superimposed dissection. Vertebral arteries: Motion affects visualization of the proximal subclavian artery on the left. There is likely mild ostial stenosis. No visible left vertebral flow until the V2 segment. There is strong dominance of the right vertebral artery without superimposed flow limiting stenosis. There is scattered atherosclerotic plaque on the right vertebral artery. Skeleton: No acute or aggressive finding. Other neck: No incidental mass or adenopathy. Upper chest: No acute finding Review of the MIP images confirms the above findings CTA HEAD FINDINGS Anterior circulation: Faint flow seen in the left proximal intracranial ICA, with none by the level of the anterior genu. Occluded left MCA with distal opacification beginning at the M2/3 level. There is a robust anterior communicating artery and good bilateral A2 and distal flow. No right-sided branch occlusion is seen. Posterior circulation: Diminutive left vertebral artery, especially before the PICA. The distal left V4 segment may be flow in retrograde. Negative for branch occlusion or flow limiting stenosis. Venous sinuses: As permitted by contrast timing, patent. Anatomic variants: None significant Delayed phase: Not performed in the emergent setting. See head CT for record of communication. Review of the MIP images confirms the above findings IMPRESSION: 1. Positive for emergent large vessel occlusion. No flow seen in the left M1 and distal intracranial LICA. There is flow seen in the left M2/3 branches suggesting favorable collaterals. Aspects scored on prior noncontrast head CT. 2. Severe atheromatous stenosis at the left ICA bulb which may be an embolic source. There is comparatively poor flow between the stenosis and intracranial occlusion. 3. ~ 40% right ICA bulb atheromatous narrowing. 4. No flow seen in the congenitally  diminutive proximal left vertebral artery. There is good flow in the distal left V4 segment and left PICA, which may be retrograde. 5. Advanced atherosclerosis of the aortic arch with irregular plaque/thrombus at the isthmus. Motion affects visualization of the great vessel ostia. Electronically Signed   By: Monte Fantasia M.D.   On: 08/21/2017 19:03   Ct Head Code Stroke Wo Contrast  Result Date: 08/21/2017 CLINICAL DATA:  Code stroke.  Code stroke.  Right-sided weakness. EXAM: CT HEAD WITHOUT CONTRAST TECHNIQUE: Contiguous axial images were obtained from the base of the skull through the vertex without intravenous contrast. COMPARISON:  None. FINDINGS: Brain: There is low-density in the posterior left putamen and posterior left insula. Small area of left parietal cortex blurring. Negative for hemorrhage. No masslike finding or hydrocephalus. No prior injury is noted. Vascular: Hyperdense left distal ICA and MCA. Atherosclerotic calcification. Skull: No acute finding. Sinuses/Orbits: Gaze to the left. Other: Critical Value/emergent results were called by telephone at the time of interpretation on 08/21/2017 at 6:43 pm to Dr. Davonna Belling , who verbally acknowledged these results. ASPECTS [facility] Stroke Program Early CT Score) -  Ganglionic level infarction (caudate, lentiform nuclei, internal capsule, insula, M1-M3 cortex): 5 - Supraganglionic infarction (M4-M6 cortex): 2 Total score (0-10 with 10 being normal): 7 IMPRESSION: Hyperdense distal left ICA and M1 segment consistent with thrombus in this setting. Small acute infarcts seen in the left insula, left putamen, and possibly left parietal cortex ASPECTS is 7 to 8. Electronically Signed   By: Monte Fantasia M.D.   On: 08/21/2017 18:46   STUDIES:  CT head 11/26 >>  Hyperdense distal left ICA and M1 segment consistent with thrombus in this setting. Small acute infarcts seen in the left insula, left putamen, and possibly left parietal cortex  ASPECTS is 7 to 8.  CTA head/neck 11/26 >>  1. Positive for emergent large vessel occlusion. No flow seen in the left M1 and distal intracranial LICA. There is flow seen in the left M2/3 branches suggesting favorable collaterals. Aspects scored on prior noncontrast head CT. 2. Severe atheromatous stenosis at the left ICA bulb which may be an embolic source. There is comparatively poor flow between the stenosis and intracranial occlusion. 3. ~ 40% right ICA bulb atheromatous narrowing. 4. 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 There is good flow in the distal left V4 segment and left PICA, which may be retrograde. 5. Advanced atherosclerosis of the aortic arch with irregular plaque/thrombus at the isthmus. Motion affects visualization of the great vessel ostia  CT head (post EVR) 11/26 >>  CULTURES: MRSA PCR 11/26 >>  ANTIBIOTICS: none  SIGNIFICANT EVENTS: 11/26  Admit  LINES/TUBES: PIV x 2 ETT 11/26 >> OGT 11/26 >> Left fem sheath 11/26 >> Left radial Aline 11/26 >>  DISCUSSION: 53 yoF with no known PMH (not seen by MD > 15 yrs) presents with aphasia, right sided weakness, and left gaze found to have acute L MCA and L ICA occulusion.  Treated with TPA and taken for EVR with mechanical thrombectomy with complete revascularization.  ASSESSMENT / PLAN:  PULMONARY A: Acute respiratory insufficency- in the setting of EVR/ CVA P:   Full MV support, 8 cc/kg CXR and ABG now VAP protocol SBT if meets criteria in am, hold extubation till after sheath removed  SLP after extubation  Duoneb prn   CARDIOVASCULAR A:  CVA - neg troponin, EKG nonacute - lactic acid 0.65 P:  Tele monitoring Cardene for goal SBP 120-140 Continue Aline Sheath per Neuro Trend troponin/ EKG in am Assess TTE Lipid panel per stroke workup   RENAL A:   No acute issues P:   Assess mag/ phos Trend BMP / urinary output/ daily weights Replace  electrolytes as indicated Avoid nephrotoxic agents, ensure adequate renal perfusion  GASTROINTESTINAL A:   NPO - normal LFTs P:   OGT PPI for SUP Bowel regimen for PAD protocol  HEMATOLOGIC A:   Leukocytosis- mild, likely reactive Elevated H/H P:  Trend CBC No anticoagulation x 24 hr s/p TPA HIV ordered   INFECTIOUS A:   Leukocytosis-  P:   Assess CXR and UA Trend WBC / fever control Monitor clinically for now  ENDOCRINE A:   No acute issues P:   CBG q 4hr Assess TSH and HgbA1c  NEUROLOGIC A:   Acute L MCA and L ICA occulusion s/p TPA and EVR with complete revascularization s/p mechanical thrombectomy  - presented with aphasia, right sided weakness and left gaze P:   RASS goal: -1/-2 PAD protocol with propofol and prn fentanyl Assess triglycerides Stroke team primary service  Further  imaging per Stroke team - MRI scheduled for am Pending CT head s/p procedure Strict SBP management as above  UDS pending SLP/ PT/ OT consults when appropriate    FAMILY  - Updates: No family at bedside 11/26.    - Inter-disciplinary family meet or Palliative Care meeting due by: 08/28/2017  CCT 45 mins   Kennieth Rad, AGACNP- [facility] Pulmonary and [place] Pager: [phone]  08/21/2017, 11:34 PM

## 2017-08-21 NOTE — Anesthesia Preprocedure Evaluation (Addendum)
Anesthesia Evaluation  Patient identified by MRN, date of birth, ID band Patient unresponsive    Reviewed: Unable to perform ROS - Chart review onlyPreop documentation limited or incomplete due to emergent nature of procedure.  Airway Mallampati: II  TM Distance: >3 FB Neck ROM: Full    Dental  (+) Teeth Intact   Pulmonary    Pulmonary exam normal        Cardiovascular Normal cardiovascular exam     Neuro/Psych CVA    GI/Hepatic   Endo/Other    Renal/GU      Musculoskeletal   Abdominal   Peds  Hematology   Anesthesia Other Findings   Reproductive/Obstetrics                            Anesthesia Physical Anesthesia Plan  ASA: IV and emergent  Anesthesia Plan: General   Post-op Pain Management:    Induction: Intravenous, Rapid sequence and Cricoid pressure planned  PONV Risk Score and Plan: 3 and Ondansetron and Treatment may vary due to age or medical condition  Airway Management Planned: Oral ETT  Additional Equipment: Arterial line  Intra-op Plan:   Post-operative Plan: Post-operative intubation/ventilation  Informed Consent: I have reviewed the patients History and Physical, chart, labs and discussed the procedure including the risks, benefits and alternatives for the proposed anesthesia with the patient or authorized representative who has indicated his/her understanding and acceptance.   History available from chart only and Only emergency history available  Plan Discussed with: CRNA and Surgeon  Anesthesia Plan Comments:        Anesthesia Quick Evaluation

## 2017-08-21 NOTE — Sedation Documentation (Signed)
Patient placement called for bed assignment. Spoke with [employer]. Pt will be going to room 4N22.  Bed teletracked to come to IR2.

## 2017-08-21 NOTE — Progress Notes (Signed)
Patient ID: [name], female   DOB: 03-07-1961, 56 y.o.   MRN: [id number] INR . 56 yr old rt handed female with acute onset of aphasia ,rt sided weakness and lt gaze deviation .LSN at 330pm. CT brain No ICH.INH 18.Premorbid  Modified rankin 0 ASPECTS 7.  CTA LT MCA and Lt ICA occlusion. IV TPA bolus dose 55 mg initiated. Option of endovascular revascularization to prevent further neurological injury discussed with patients husband. The procedure,risks ,benefits alternatives all reviewed. Risks of ICH of 10 to 15 %,worsening neurologic function ,vent dependency,death and inability to revascularize all discussed in detail. Questions answered. Informed witnessed consent for endovascular revascularization  under [place] obtained.[name]  S.Laylaa Guevarra MD

## 2017-08-22 ENCOUNTER — Inpatient Hospital Stay (HOSPITAL_COMMUNITY): Payer: Self-pay

## 2017-08-22 ENCOUNTER — Encounter (HOSPITAL_COMMUNITY): Payer: Self-pay | Admitting: Interventional Radiology

## 2017-08-22 ENCOUNTER — Other Ambulatory Visit: Payer: Self-pay | Admitting: Radiology

## 2017-08-22 DIAGNOSIS — I63512 Cerebral infarction due to unspecified occlusion or stenosis of left middle cerebral artery: Secondary | ICD-10-CM

## 2017-08-22 DIAGNOSIS — I639 Cerebral infarction, unspecified: Secondary | ICD-10-CM

## 2017-08-22 DIAGNOSIS — I340 Nonrheumatic mitral (valve) insufficiency: Secondary | ICD-10-CM

## 2017-08-22 DIAGNOSIS — Z978 Presence of other specified devices: Secondary | ICD-10-CM

## 2017-08-22 DIAGNOSIS — R0689 Other abnormalities of breathing: Secondary | ICD-10-CM

## 2017-08-22 LAB — TSH: TSH: 1.086 u[IU]/mL (ref 0.350–4.500)

## 2017-08-22 LAB — BLOOD GAS, ARTERIAL
Acid-base deficit: 2.5 mmol/L — ABNORMAL HIGH (ref 0.0–2.0)
Acid-base deficit: 3.2 mmol/L — ABNORMAL HIGH (ref 0.0–2.0)
Bicarbonate: 22.4 mmol/L (ref 20.0–28.0)
Bicarbonate: 22.5 mmol/L (ref 20.0–28.0)
DRAWN BY: 418751
Drawn by: 398991
FIO2: 40
FIO2: 80
MECHVT: 400 mL
MECHVT: 400 mL
O2 Saturation: 95.8 %
O2 Saturation: 96 %
PEEP: 5 cmH2O
PEEP: 5 cmH2O
PH ART: 7.283 — AB (ref 7.350–7.450)
Patient temperature: 97.1
Patient temperature: 98.6
RATE: 16 resp/min
RATE: 16 resp/min
pCO2 arterial: 42 mmHg (ref 32.0–48.0)
pCO2 arterial: 49 mmHg — ABNORMAL HIGH (ref 32.0–48.0)
pH, Arterial: 7.344 — ABNORMAL LOW (ref 7.350–7.450)
pO2, Arterial: 82.6 mmHg — ABNORMAL LOW (ref 83.0–108.0)
pO2, Arterial: 97.9 mmHg (ref 83.0–108.0)

## 2017-08-22 LAB — ECHOCARDIOGRAM COMPLETE
Height: 62 in
Weight: 2144 oz

## 2017-08-22 LAB — GLUCOSE, CAPILLARY
GLUCOSE-CAPILLARY: 107 mg/dL — AB (ref 65–99)
GLUCOSE-CAPILLARY: 141 mg/dL — AB (ref 65–99)
GLUCOSE-CAPILLARY: 148 mg/dL — AB (ref 65–99)
Glucose-Capillary: 140 mg/dL — ABNORMAL HIGH (ref 65–99)
Glucose-Capillary: 124 mg/dL — ABNORMAL HIGH (ref 65–99)
Glucose-Capillary: 132 mg/dL — ABNORMAL HIGH (ref 65–99)
Glucose-Capillary: 113 mg/dL — ABNORMAL HIGH (ref 65–99)

## 2017-08-22 LAB — SEDIMENTATION RATE: SED RATE: 10 mm/h (ref 0–22)

## 2017-08-22 LAB — BASIC METABOLIC PANEL
Anion gap: 7 (ref 5–15)
BUN: 13 mg/dL (ref 6–20)
CHLORIDE: 107 mmol/L (ref 101–111)
CO2: 23 mmol/L (ref 22–32)
CREATININE: 0.58 mg/dL (ref 0.44–1.00)
Calcium: 8.3 mg/dL — ABNORMAL LOW (ref 8.9–10.3)
GFR calc non Af Amer: >60 mL/min (ref >60)
Glucose, Bld: 148 mg/dL — ABNORMAL HIGH (ref 65–99)
POTASSIUM: 3.8 mmol/L (ref 3.5–5.1)
Sodium: 137 mmol/L (ref 135–145)

## 2017-08-22 LAB — CBC WITH DIFFERENTIAL/PLATELET
BASOS PCT: 0 %
Basophils Absolute: 0 10*3/uL (ref 0.0–0.1)
Eosinophils Absolute: 0 10*3/uL (ref 0.0–0.7)
Eosinophils Relative: 0 %
HEMATOCRIT: 42.9 % (ref 36.0–46.0)
HEMOGLOBIN: 14.3 g/dL (ref 12.0–15.0)
LYMPHS ABS: 1.3 10*3/uL (ref 0.7–4.0)
Lymphocytes Relative: 8 %
MCH: 31.8 pg (ref 26.0–34.0)
MCHC: 33.3 g/dL (ref 30.0–36.0)
MCV: 95.5 fL (ref 78.0–100.0)
MONOS PCT: 7 %
Monocytes Absolute: 1.1 10*3/uL — ABNORMAL HIGH (ref 0.1–1.0)
NEUTROS ABS: 14.7 10*3/uL — AB (ref 1.7–7.7)
NEUTROS PCT: 85 %
Platelets: 368 10*3/uL (ref 150–400)
RBC: 4.49 MIL/uL (ref 3.87–5.11)
RDW: 13.8 % (ref 11.5–15.5)
WBC: 17.1 10*3/uL — AB (ref 4.0–10.5)

## 2017-08-22 LAB — URINALYSIS, ROUTINE W REFLEX MICROSCOPIC
BILIRUBIN URINE: NEGATIVE
Glucose, UA: NEGATIVE mg/dL
Hgb urine dipstick: NEGATIVE
KETONES UR: NEGATIVE mg/dL
Leukocytes, UA: NEGATIVE
NITRITE: NEGATIVE
PROTEIN: NEGATIVE mg/dL
Specific Gravity, Urine: 1.014 (ref 1.005–1.030)
pH: 5 (ref 5.0–8.0)

## 2017-08-22 LAB — TROPONIN I: Troponin I: 0.03 ng/mL (ref <0.03)

## 2017-08-22 LAB — HEMOGLOBIN A1C
Hgb A1c MFr Bld: 5.5 % (ref 4.8–5.6)
Mean Plasma Glucose: 111.15 mg/dL

## 2017-08-22 LAB — LIPID PANEL
Cholesterol: 237 mg/dL — ABNORMAL HIGH (ref 0–200)
HDL: 35 mg/dL — AB (ref >40)
LDL CALC: 143 mg/dL — AB (ref 0–99)
TRIGLYCERIDES: 295 mg/dL — AB (ref <150)
Total CHOL/HDL Ratio: 6.8 RATIO
VLDL: 59 mg/dL — ABNORMAL HIGH (ref 0–40)

## 2017-08-22 LAB — MAGNESIUM: Magnesium: 1.9 mg/dL (ref 1.7–2.4)

## 2017-08-22 LAB — RAPID URINE DRUG SCREEN, HOSP PERFORMED
Amphetamines: NOT DETECTED
BARBITURATES: NOT DETECTED
Benzodiazepines: POSITIVE — AB
COCAINE: NOT DETECTED
OPIATES: NOT DETECTED
Tetrahydrocannabinol: NOT DETECTED

## 2017-08-22 LAB — PHOSPHORUS: Phosphorus: 3.7 mg/dL (ref 2.5–4.6)

## 2017-08-22 LAB — HIV ANTIBODY (ROUTINE TESTING W REFLEX): HIV Screen 4th Generation wRfx: NONREACTIVE

## 2017-08-22 LAB — MRSA PCR SCREENING: MRSA by PCR: NEGATIVE

## 2017-08-22 LAB — TRIGLYCERIDES: TRIGLYCERIDES: 301 mg/dL — AB (ref <150)

## 2017-08-22 MED ORDER — MIDAZOLAM HCL 2 MG/2ML IJ SOLN
1.0000 mg | INTRAMUSCULAR | Status: DC | PRN
  Administered 2017-08-22 – 2017-08-23 (×3): 2 mg via INTRAVENOUS
  Filled 2017-08-22 (×3): qty 2

## 2017-08-22 MED ORDER — FENTANYL CITRATE (PF) 100 MCG/2ML IJ SOLN
25.0000 ug | INTRAMUSCULAR | Status: DC | PRN
  Administered 2017-08-22 (×3): 100 ug via INTRAVENOUS
  Administered 2017-08-22 (×2): 50 ug via INTRAVENOUS
  Administered 2017-08-23: 100 ug via INTRAVENOUS
  Filled 2017-08-22 (×6): qty 2

## 2017-08-22 MED ORDER — NICOTINE 14 MG/24HR TD PT24
14.0000 mg | MEDICATED_PATCH | Freq: Every day | TRANSDERMAL | Status: DC | PRN
  Filled 2017-08-22: qty 1

## 2017-08-22 MED ORDER — SODIUM CHLORIDE 0.9 % IV SOLN
0.0000 ug/min | INTRAVENOUS | Status: DC
  Filled 2017-08-22: qty 1

## 2017-08-22 MED ORDER — ATORVASTATIN CALCIUM 80 MG PO TABS
80.0000 mg | ORAL_TABLET | Freq: Every day | ORAL | Status: DC
  Administered 2017-08-22 – 2017-08-27 (×4): 80 mg via ORAL
  Filled 2017-08-22 (×4): qty 1

## 2017-08-22 NOTE — Progress Notes (Signed)
OT Cancellation Note  Patient Details Name: [name] MRN: [id number] DOB: [birth year]   Cancelled Treatment:    Reason Eval/Treat Not Completed: Patient not medically ready. Pt with strict bedrest orders and intubated/sedated. Will await increase in activity orders prior to initiation of evaluation.   Norman Herrlich, MS OTR/L  Pager: [phone]   Norman Herrlich 08/22/2017, 6:51 AM

## 2017-08-22 NOTE — Progress Notes (Signed)
  Echocardiogram 2D Echocardiogram has been performed.  Merrie Roof F 08/22/2017, 4:00 PM

## 2017-08-22 NOTE — Progress Notes (Signed)
36fr sheath pulled from left groin using manual pressure and v-pad at 1240 hrs.  Hemostasis achieved at 1300 hrs. Left groin site reviewed with Margart Sickles.  Distal pulses palpable.  No complications.  [name] Rt-R CV [employer] Rt-R

## 2017-08-22 NOTE — Progress Notes (Signed)
PULMONARY / CRITICAL CARE MEDICINE   Name: [name] MRN: [id number] DOB: 24-Dec-1960    ADMISSION DATE:  08/21/2017   REFERRING MD:  Dr. Leonel Ramsay  CHIEF COMPLAINT:  stroke  HISTORY OF PRESENT ILLNESS:   56 year old right handed female with no significant PMH/ has not seen physican in 15 years who presented to [facility] with acute onset of aphasia, right sided weakness and left gaze 11/26.  LSW at 1515.  CT head negative for ICH however CTA showed left MCA and left ICA occlusion.  TPA given at [place]. Transferred to [facility] for endovascular revascularization.  Intubated for procedure.  CCM consulted for ventilator management.     PAST MEDICAL HISTORY :  She  has no past medical history on file.  PAST SURGICAL HISTORY: She  has no past surgical history on file.  No Known Allergies  No current facility-administered medications on file prior to encounter.    No current outpatient medications on file prior to encounter.    SUBJECTIVE:  Patient intubated, sedated. RASS of -2. On 50ug/kg/min of propofol.  VITAL SIGNS: BP (!) 142/58   Pulse 93   Temp (!) 97.1 F (36.2 C) (Axillary)   Resp 16   Ht 5\' 2"  (1.575 m)   Wt 60.8 kg (134 lb)   LMP  (LMP Unknown)   SpO2 100%   BMI 24.51 kg/m   HEMODYNAMICS:    VENTILATOR SETTINGS: Vent Mode: PRVC FiO2 (%):  [40 %-100 %] 40 % Set Rate:  [16 bmp] 16 bmp Vt Set:  [400 mL] 400 mL PEEP:  [5 cmH20] 5 cmH20 Plateau Pressure:  [13 cmH20-16 cmH20] 13 cmH20  INTAKE / OUTPUT: I/O last 3 completed shifts: In: 2101 [I.V.:1601; Blood:500] Out: 1875 [NWGNF:6213; Blood:200]  PHYSICAL EXAMINATION: General:  Sedated. Neuro:  RASS of -2. Can not adequately assess motor function. HEENT:  PERL. ETT, OGT. No jvd or icterus. Cardiovascular:  Normal s1s2, regular rhythm. No murmur or gallop. Lungs:  Bilateral breath sounds. Clear to auscultation. No wheezes, crackles or rhonchi. Abdomen:  Flat. No muscle guarding. No  organomegaly. Musculoskeletal:  +2 pulses, right radial, bilateral DP's. Left femoral arterial site bandaged. Left radial artery catheter. Skin:  No ecchymoses. No edema.  LABS:  BMET Recent Labs  Lab 08/21/17 1829 08/21/17 1843  NA 133* 140  K 3.6 3.7  CL 101 102  CO2 26  --   BUN 17 18  CREATININE 0.69 0.70  GLUCOSE 96 94    Electrolytes Recent Labs  Lab 08/21/17 1829  CALCIUM 9.6    CBC Recent Labs  Lab 08/21/17 1829 08/21/17 1843 08/22/17 0811  WBC 13.2*  --  17.1*  HGB 16.2* 17.7* 14.3  HCT 49.9* 52.0* 42.9  PLT 398  --  368    Coag's Recent Labs  Lab 08/21/17 1829  APTT 41*  INR 0.85    Sepsis Markers Recent Labs  Lab 08/21/17 1839  LATICACIDVEN 0.65    ABG Recent Labs  Lab 08/21/17 0008  PHART 7.283*  PCO2ART 49.0*  PO2ART 97.9    Liver Enzymes Recent Labs  Lab 08/21/17 1829  AST 32  ALT 27  ALKPHOS 73  BILITOT 0.4  ALBUMIN 4.5    Cardiac Enzymes No results for input(s): TROPONINI, PROBNP in the last 168 hours.  Glucose Recent Labs  Lab 08/22/17 0020 08/22/17 0403 08/22/17 0801  GLUCAP 107* 140* 148*    Imaging Ct Angio Head W Or Wo Contrast  Result Date: 08/21/2017  CLINICAL DATA:  Left MCA syndrome.  Abnormal head CT. EXAM: CT ANGIOGRAPHY HEAD AND NECK TECHNIQUE: Multidetector CT imaging of the head and neck was performed using the standard protocol during bolus administration of intravenous contrast. Multiplanar CT image reconstructions and MIPs were obtained to evaluate the vascular anatomy. Carotid stenosis measurements (when applicable) are obtained utilizing NASCET criteria, using the distal internal carotid diameter as the denominator. CONTRAST:  47mL ISOVUE-370 IOPAMIDOL (ISOVUE-370) INJECTION 76% COMPARISON:  None. FINDINGS: CTA NECK FINDINGS Aortic arch: Extensive atheromatous thickening and calcification of the arch, especially for age. Low-density plaque or luminal thrombus seen at the isthmus. There is  degradation by motion artifact. Right carotid system: Atheromatous plaque on the ventral distal common carotid. There is mildly irregular plaque at the ICA bulb with 40% proximal ICA stenosis. Negative for dissection. Left carotid system: Motion affects visualization of the proximal common carotid. There is atheromatous wall thickening of the ventral distal common carotid wall. Plaque at the common carotid bifurcation and ICA bulb with severe stenosis at the ICA bulb, with downstream underfilling. This is a likely source of embolism. No superimposed dissection. Vertebral arteries: Motion affects visualization of the proximal subclavian artery on the left. There is likely mild ostial stenosis. No visible left vertebral flow until the V2 segment. There is strong dominance of the right vertebral artery without superimposed flow limiting stenosis. There is scattered atherosclerotic plaque on the right vertebral artery. Skeleton: No acute or aggressive finding. Other neck: No incidental mass or adenopathy. Upper chest: No acute finding Review of the MIP images confirms the above findings CTA HEAD FINDINGS Anterior circulation: Faint flow seen in the left proximal intracranial ICA, with none by the level of the anterior genu. Occluded left MCA with distal opacification beginning at the M2/3 level. There is a robust anterior communicating artery and good bilateral A2 and distal flow. No right-sided branch occlusion is seen. Posterior circulation: Diminutive left vertebral artery, especially before the PICA. The distal left V4 segment may be flow in retrograde. Negative for branch occlusion or flow limiting stenosis. Venous sinuses: As permitted by contrast timing, patent. Anatomic variants: None significant Delayed phase: Not performed in the emergent setting. See head CT for record of communication. Review of the MIP images confirms the above findings IMPRESSION: 1. Positive for emergent large vessel occlusion. No flow  seen in the left M1 and distal intracranial LICA. There is flow seen in the left M2/3 branches suggesting favorable collaterals. Aspects scored on prior noncontrast head CT. 2. Severe atheromatous stenosis at the left ICA bulb which may be an embolic source. There is comparatively poor flow between the stenosis and intracranial occlusion. 3. ~ 40% right ICA bulb atheromatous narrowing. 4. 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 There is good flow in the distal left V4 segment and left PICA, which may be retrograde. 5. Advanced atherosclerosis of the aortic arch with irregular plaque/thrombus at the isthmus. Motion affects visualization of the great vessel ostia. Electronically Signed   By: Monte Fantasia M.D.   On: 08/21/2017 19:03   Ct Head Wo Contrast  Result Date: 08/21/2017 CLINICAL DATA:  Status post stroke intervention EXAM: CT HEAD WITHOUT CONTRAST TECHNIQUE: Contiguous axial images were obtained from the base of the skull through the vertex without intravenous contrast. COMPARISON:  Head CT and CTA 08/21/2017 FINDINGS: Brain: No mass lesion, intraparenchymal hemorrhage or extra-axial collection. No evidence of acute cortical infarct. Brain parenchyma and CSF-containing spaces are normal for age. Vascular:  No hyperdense vessel or unexpected calcification. Intravenous contrast persist within the vessels. Skull: Normal visualized skull base, calvarium and extracranial soft tissues. Sinuses/Orbits: No sinus fluid levels or advanced mucosal thickening. No mastoid effusion. Normal orbits. IMPRESSION: Normal head CT. No intraparenchymal hemorrhage, contrast staining or cytotoxic edema. Electronically Signed   By: Ulyses Jarred M.D.   On: 08/21/2017 23:35   Ct Angio Neck W Or Wo Contrast  Result Date: 08/21/2017 CLINICAL DATA:  Left MCA syndrome.  Abnormal head CT. EXAM: CT ANGIOGRAPHY HEAD AND NECK TECHNIQUE: Multidetector CT imaging of the head and neck was performed  using the standard protocol during bolus administration of intravenous contrast. Multiplanar CT image reconstructions and MIPs were obtained to evaluate the vascular anatomy. Carotid stenosis measurements (when applicable) are obtained utilizing NASCET criteria, using the distal internal carotid diameter as the denominator. CONTRAST:  78mL ISOVUE-370 IOPAMIDOL (ISOVUE-370) INJECTION 76% COMPARISON:  None. FINDINGS: CTA NECK FINDINGS Aortic arch: Extensive atheromatous thickening and calcification of the arch, especially for age. Low-density plaque or luminal thrombus seen at the isthmus. There is degradation by motion artifact. Right carotid system: Atheromatous plaque on the ventral distal common carotid. There is mildly irregular plaque at the ICA bulb with 40% proximal ICA stenosis. Negative for dissection. Left carotid system: Motion affects visualization of the proximal common carotid. There is atheromatous wall thickening of the ventral distal common carotid wall. Plaque at the common carotid bifurcation and ICA bulb with severe stenosis at the ICA bulb, with downstream underfilling. This is a likely source of embolism. No superimposed dissection. Vertebral arteries: Motion affects visualization of the proximal subclavian artery on the left. There is likely mild ostial stenosis. No visible left vertebral flow until the V2 segment. There is strong dominance of the right vertebral artery without superimposed flow limiting stenosis. There is scattered atherosclerotic plaque on the right vertebral artery. Skeleton: No acute or aggressive finding. Other neck: No incidental mass or adenopathy. Upper chest: No acute finding Review of the MIP images confirms the above findings CTA HEAD FINDINGS Anterior circulation: Faint flow seen in the left proximal intracranial ICA, with none by the level of the anterior genu. Occluded left MCA with distal opacification beginning at the M2/3 level. There is a robust anterior  communicating artery and good bilateral A2 and distal flow. No right-sided branch occlusion is seen. Posterior circulation: Diminutive left vertebral artery, especially before the PICA. The distal left V4 segment may be flow in retrograde. Negative for branch occlusion or flow limiting stenosis. Venous sinuses: As permitted by contrast timing, patent. Anatomic variants: None significant Delayed phase: Not performed in the emergent setting. See head CT for record of communication. Review of the MIP images confirms the above findings IMPRESSION: 1. Positive for emergent large vessel occlusion. No flow seen in the left M1 and distal intracranial LICA. There is flow seen in the left M2/3 branches suggesting favorable collaterals. Aspects scored on prior noncontrast head CT. 2. Severe atheromatous stenosis at the left ICA bulb which may be an embolic source. There is comparatively poor flow between the stenosis and intracranial occlusion. 3. ~ 40% right ICA bulb atheromatous narrowing. 4. 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 There is good flow in the distal left V4 segment and left PICA, which may be retrograde. 5. Advanced atherosclerosis of the aortic arch with irregular plaque/thrombus at the isthmus. Motion affects visualization of the great vessel ostia. Electronically Signed   By: Monte Fantasia M.D.   On:  08/21/2017 19:03   Portable Chest Xray  Result Date: 08/22/2017 CLINICAL DATA:  56 year old female status post emergent large vessel occlusion - left ICA and left MCA - with endovascular revascularization. EXAM: PORTABLE CHEST 1 VIEW COMPARISON:  08/21/2017 and earlier. FINDINGS: Portable AP semi upright view at 0601 hours. Endotracheal tube tip in good position at the level the clavicles. Enteric tube courses to the abdomen. Stable lung volumes. Stable cardiac size and mediastinal contours. Mild left lung base patchy opacity is stable. Elsewhere the lungs are clear  when allowing for portable technique. Paucity bowel gas in the upper abdomen. IMPRESSION: 1.  Stable lines and tubes. 2. Stable ventilation with mild left lung base atelectasis. Electronically Signed   By: Genevie Ann M.D.   On: 08/22/2017 07:44   Portable Chest Xray  Result Date: 08/21/2017 CLINICAL DATA:  ETT placement EXAM: PORTABLE CHEST 1 VIEW COMPARISON:  None FINDINGS: Endotracheal tube tip is about 2.9 cm superior to the carina. Esophageal tube tip is below the diaphragm but non included. Opacity at the left base may reflect atelectasis versus minimal infiltrate. No large effusion. Borderline heart size. Aortic atherosclerosis. No pneumothorax. IMPRESSION: Endotracheal tube tip about 2.9 cm superior to carina. Patchy atelectasis versus minimal infiltrate at the left base. Electronically Signed   By: Donavan Foil M.D.   On: 08/21/2017 23:50   Ct Head Code Stroke Wo Contrast  Result Date: 08/21/2017 CLINICAL DATA:  Code stroke.  Code stroke.  Right-sided weakness. EXAM: CT HEAD WITHOUT CONTRAST TECHNIQUE: Contiguous axial images were obtained from the base of the skull through the vertex without intravenous contrast. COMPARISON:  None. FINDINGS: Brain: There is low-density in the posterior left putamen and posterior left insula. Small area of left parietal cortex blurring. Negative for hemorrhage. No masslike finding or hydrocephalus. No prior injury is noted. Vascular: Hyperdense left distal ICA and MCA. Atherosclerotic calcification. Skull: No acute finding. Sinuses/Orbits: Gaze to the left. Other: Critical Value/emergent results were called by telephone at the time of interpretation on 08/21/2017 at 6:43 pm to Dr. Davonna Belling , who verbally acknowledged these results. ASPECTS [facility] Stroke Program Early CT Score) - Ganglionic level infarction (caudate, lentiform nuclei, internal capsule, insula, M1-M3 cortex): 5 - Supraganglionic infarction (M4-M6 cortex): 2 Total score (0-10 with 10 being  normal): 7 IMPRESSION: Hyperdense distal left ICA and M1 segment consistent with thrombus in this setting. Small acute infarcts seen in the left insula, left putamen, and possibly left parietal cortex ASPECTS is 7 to 8. Electronically Signed   By: Monte Fantasia M.D.   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 And M2 segs with x1 pass with solitaire 60mm x 40 mm FR  retrieval device achieving TICI 3 repetfusion.             CULTURES: None  ANTIBIOTICS: None  SIGNIFICANT EVENTS: S/p medical embolectomy.  LINES/TUBES: Left femoral sheath 11/26 Left radial artery 11/26  DISCUSSION: 56 y.o. Female with presentation of L MCA stroke, s/p tPA  and medical embolectomy.  ASSESSMENT / PLAN:  PULMONARY A: Acute respiratory failure in the setting of EVR/ CVA P:   Patient to undergo sheath removal and MRI brain. Neurology to then decide on possible extubation. VAP protocol SLP after extubation  Duoneb prn   CARDIOVASCULAR A:  CVA - neg troponin, EKG nonacute - lactic acid 0.65 P:  Tele monitoring Continue cardene for goal SBP 120-140 Continue arterial linev Sheath per Neuro Follow up troponin and ecg today. Assess TTE Lipid panel per stroke workup   RENAL A:   No acute issues P:   Assess mag/ phos; pending. Replace electrolytes as indicated Avoid nephrotoxic agents, ensure adequate renal perfusion  GASTROINTESTINAL A:   NPO - normal LFTs P:   OGT PPI for SUP Bowel regimen for PAD  protocol  HEMATOLOGIC A:   Leukocytosis- mild, likely reactive Elevated H/H P:  Trend CBC No anticoagulation x 24 hr s/p TPA HIV ordered   INFECTIOUS A:   Leukocytosis- likely due to stroke P:   Chest xray with possible LLL atelectasis. U/A pending. Trend WBC / fever control   ENDOCRINE A:   No acute issues P:   CBG q 4hr Assess TSH and HgbA1c  NEUROLOGIC A:   Acute L MCA and L ICA occulusion s/p TPA and EVR with complete revascularization s/p mechanical thrombectomy  - presented with aphasia, right sided weakness and left gaze P:   RASS goal: -1/-2. Await decision regarding extubation. PAD protocol with propofol and prn fentanyl Assess triglycerides MRI scheduled today Strict SBP management as above  UDS pending SLP/ PT/ OT consults when appropriate    FAMILY  - Updates: Husband at the bedside. Informed of plan for MRI, possible extubation.  - Inter-disciplinary family meet or Palliative Care meeting due by: 08/28/2017  Critical care time spent independently equals 40 minutes.     Pulmonary and [place] Pager: [phone]  08/22/2017, 8:59 AM

## 2017-08-22 NOTE — Progress Notes (Signed)
[place] Progress Note Patient Name: [name] DOB: [birth year] MRN: [id number]   Date of Service  08/22/2017  HPI/Events of Note  Notified by bedside nurse. Patient below blood pressure goal parameters currently on high-dose propofol infusion to maintain sedation. Peripheral IV access.   eICU Interventions  1. Ordering Versed IV when necessary for sedation 2. Adjusting bolus fentanyl for wider range to help control pain & prevent hypotension 3. Wean propofol infusion 4. Low-dose peripherally infused Neo-Synephrine to help maintain cerebral perfusion pressure 5. Continuous monitoring of blood pressure with arterial line      Intervention Category Major Interventions: Other:  Tera Partridge 08/22/2017, 1:37 AM

## 2017-08-22 NOTE — Progress Notes (Signed)
PT Cancellation Note  Patient Details Name: [name] MRN: [id number] DOB: 06-May-1961   Cancelled Treatment:    Reason Eval/Treat Not Completed: Patient not medically ready. Pt currently on bedrest. Will await increased activity orders prior to initiating PT eval.   Thelma Comp 08/22/2017, 7:18 AM   Rolinda Roan, PT, DPT Acute Rehabilitation Services Pager: [phone]

## 2017-08-22 NOTE — Progress Notes (Signed)
STROKE TEAM PROGRESS NOTE   SUBJECTIVE (INTERVAL HISTORY)  No family is at the bedside. Patient is found laying in bed in NAD, remains sedated and intubated No new events reported overnight per nursing.  OBJECTIVE Lab Results: CBC:  Recent Labs  Lab 08/21/17 1829 08/21/17 1843 08/22/17 0811  WBC 13.2*  --  17.1*  HGB 16.2* 17.7* 14.3  HCT 49.9* 52.0* 42.9  MCV 98.4  --  95.5  PLT 398  --  368   BMP: Recent Labs  Lab 08/21/17 1829 08/21/17 1843 08/22/17 0811 08/22/17 1016  NA 133* 140 137  --   K 3.6 3.7 3.8  --   CL 101 102 107  --   CO2 26  --  23  --   GLUCOSE 96 94 148*  --   BUN 17 18 13   --   CREATININE 0.69 0.70 0.58  --   CALCIUM 9.6  --  8.3*  --   MG  --   --   --  1.9  PHOS  --   --   --  3.7   Liver Function Tests:  Recent Labs  Lab 08/21/17 1829  AST 32  ALT 27  ALKPHOS 73  BILITOT 0.4  PROT 8.3*  ALBUMIN 4.5   Cardiac Enzymes:  Recent Labs  Lab 08/22/17 1016  TROPONINI 0.03*   Coagulation Studies:  Recent Labs    08/21/17 1829  APTT 41*  INR 0.85   Alcohol Level:  Recent Labs  Lab 08/21/17 [place] <10    PHYSICAL EXAM Temp:  [96.3 F (35.7 C)-98.4 F (36.9 C)] 97.6 F (36.4 C) (11/27 1200) Pulse Rate:  [73-109] 104 (11/27 1400) Resp:  [13-29] 27 (11/27 1400) BP: (76-157)/(50-91) 121/64 (11/27 1400) SpO2:  [94 %-100 %] 99 % (11/27 1400) Arterial Line BP: (97-153)/(43-84) 133/53 (11/27 1230) FiO2 (%):  [40 %-100 %] 40 % (11/27 1114) Weight:  [60.8 kg (134 lb)] 60.8 kg (134 lb) (11/26 1901) General - obese middle-age Caucasian lady who is intubated, in no apparent distress Respiratory - Lungs clear bilaterally. No wheezing. Cardiovascular - Regular rate and rhythm   Neuro: Mental Status: Patient is sedated and intubated.   Cranial Nerves: II: Does not blink to threat from the right.  Pupils are equal, round, and reactive to light.   III,IV, VI: Left gaze preference V: Facial sensation appears symmetric  VII:  Facial movement is weak on the right Motor: [name] has a flaccid right hemiparesis, with no movement in the right arm in flexion to noxious stimuli in the right leg.  [name] moves her left side voluntarily with full strength. Sensory: [name] does flinch to noxious stimuli, but this is reduced on the right side. Cerebellar: [name] does not perform  IMAGING: I have personally reviewed the radiological images below and agree with the radiology interpretations.  Ct Angio Head & Neck W Or Wo Contrast Result Date: 08/21/2017 IMPRESSION: 1. Positive for emergent large vessel occlusion. No flow seen in the left M1 and distal intracranial LICA. There is flow seen in the left M2/3 branches suggesting favorable collaterals. Aspects scored on prior noncontrast head CT. 2. Severe atheromatous stenosis at the left ICA bulb which may be an embolic source. There is comparatively poor flow between the stenosis and intracranial occlusion. 3. ~ 40% right ICA bulb atheromatous narrowing. 4. 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 There is good flow in the distal left V4 segment and left PICA,  which may be retrograde. 5. Advanced atherosclerosis of the aortic arch with irregular plaque/thrombus at the isthmus. Motion affects visualization of the great vessel ostia.   Ct Head Wo Contrast Result Date: 08/21/2017 IMPRESSION: Normal head CT. No intraparenchymal hemorrhage, contrast staining or cytotoxic edema.   Portable Chest Xray Result Date: 08/22/2017 IMPRESSION: 1.  Stable lines and tubes. 2. Stable ventilation with mild left lung base atelectasis.   Portable Chest Xray Result Date: 08/21/2017  IMPRESSION: Endotracheal tube tip about 2.9 cm superior to carina. Patchy atelectasis versus minimal infiltrate at the left base.  Ct Head Code Stroke Wo Contrast Result Date: 08/21/2017 IMPRESSION: Hyperdense distal left ICA and M1 segment consistent with thrombus in this setting. Small acute  infarcts seen in the left insula, left putamen, and possibly left parietal cortex ASPECTS is 7 to 8.   Echocardiogram: not done                                    PENDING B/L LE Duplex:                                                       PENDING MRI Brain:                                                               PENDING CT Head:                                                                PENDING _____________________________________________________________________ ASSESSMENT: [name] is a 56 y.o. female with PMH of no significant past medical history (has not been to a physician in 15 years) who presents with right-sided weakness that started sometime between 3:15 and 5:15 PM. Her husband left her around 3:15 PM and then her brother found her on the floor at 5:15 PM.  [name] was taken to [facility] where [name] was administered IV TPA.  CTA was performed which demonstrated left distal ICA/MCA occlusion.  [name] was within the 6-hour window for IR intervention, and was taken directly to the Cath Lab.S/P bilateral common carotid and RT vert arteriograms,followed by complete revascularization of occluded Lt ICA terminus ,Lt MCA M! 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  Suspected Etiology: Likely cardioembolic source  Resultant Symptoms: aphasia, right sided weakness and left gaze preference Stroke Risk Factors: hyperlipidemia, hypertension and no medical care for greater than 15 years Other Stroke Risk Factors: Advanced age, Cigarette smoker,   Outstanding Stroke Work-up Studies: Echocardiogram: not done  PENDING B/L LE Duplex:                                                       PENDING MRI Brain:                                                                PENDING CT Head:                                                                PENDING  PLAN  08/22/2017: HOLD ASA until 24 hour post tPA neuroimaging is stable & without evidence of bleeding Started Atorvastatin 80 mg UDS pending Will check TSH, ANA, ESR, Antiphospholipid and LE dopplers Monitor imaging IR sheath removed today SBT today per CCM Initiate PT/OT/SLP once extubated Ongoing aggressive stroke risk factor management  Leukocytosis Remains afebrile U/A pending CXR stable with mild left lung base atelectasis.  Repeat labs in AM Management per CCM - Appreciate management  R/O AFIB: May need Outpatient TEE and Loop Recorder Placement prior to discharge Will continue to monitor closely   DYSPHAGIA: NPO until passes SLP swallow evaluation, once extubated  HYPERTENSION EMERGENCY: Stable, remains sedated/intubated on mechanical ventiation S/P Thrombectomy - SBP Parameters per IR Nicardipine drip and Labetolol PRN in progress Long term BP goal normotensive. Home Meds: NONE  HYPERLIPIDEMIA:    Component Value Date/Time   CHOL 237 (H) 08/22/2017 0811   TRIG 295 (H) 08/22/2017 0811   TRIG 301 (H) 08/22/2017 0811   HDL 35 (L) 08/22/2017 0811   CHOLHDL 6.8 08/22/2017 0811   VLDL 59 (H) 08/22/2017 0811   LDLCALC 143 (H) 08/22/2017 0811  Home Meds:  NONE LDL  goal < 70 Started on Lipitor to 80 mg daily Continue statin at discharge  R/O DIABETES: Lab Results  Component Value Date   HGBA1C 5.5 08/22/2017  HgbA1c goal < 7.0   TOBACCO ABUSE Current smoker Smoking cessation counseling will be provided, once extubated Nicotine patch provided PRN  Other Active Problems: Active Problems:   Acute embolic stroke (HCC)   Endotracheally intubated   Acute respiratory insufficiency - requiring mechanical ventilation, Management per [facility] day # 1  VTE prophylaxis: Lovenox / SCD's / Heparin Alveda Reasons Diet : Diet NPO time specified   Prior Home  Stroke Medications: No antithrombotic Prior to Admission  Hospital Current Stroke Medications: NONE, at this time Stroke New Meds Plan: Now on No antithrombotic  Discharge Stroke Meds: Please discharge patient on To be determined   Disposition: Final discharge disposition not confirmed Therapy Recs:  PENDING Follow Recs:  Patient, No Pcp Per - Case Management consulted today Follow up with [facility] in 6 weeks  FAMILY UPDATES: No family at bedside  TEAM UPDATES: [place], [place] Stroke Neurology Team 08/22/2017 2:24 PM I have personally examined this patient, reviewed notes,  independently viewed imaging studies, participated in medical decision making and plan of care.ROS completed by me personally and pertinent positives fully documented  I have made any additions or clarifications directly to the above note. Agree with note above. . [name] presented with terminal left ICA and middle cerebral artery occlusion and underwent emergent mechanical thrombectomy with complete revascularization. Continue strict blood pressure control and close neurological monitoring. Discontinue groin sheath and then extubate later today if tolerated. No family available at the bedside for discussion. Discussed with Dr. Sabra Heck and Dr. Estanislado Pandy. This patient is critically ill and at significant risk of neurological worsening, death and care requires constant monitoring of vital signs, hemodynamics,respiratory and cardiac monitoring, extensive review of multiple databases, frequent neurological assessment, discussion with family, other specialists and medical decision making of high complexity.I have made any additions or clarifications directly to the above note.This critical care time does not reflect procedure time, or teaching time or supervisory time of PA/NP/Med Resident etc but could involve care discussion time.  I spent 40 minutes of neurocritical care time  in the care  of  this patient.     Antony Contras, MD Medical Director [facility] Pager: [phone] 08/22/2017 3:18 PM  To contact Stroke Continuity provider, please refer to [email or web address] After hours, contact General Neurology

## 2017-08-22 NOTE — Care Management Note (Signed)
Case Management Note  Patient Details  Name: [name] MRN: [id number] Date of Birth: Jul 01, 1961  Subjective/Objective:   Pt admitted on 08/21/17 s/p stroke with TPA given and endovascular revascularization.  PTA, pt independent, lives with spouse.                   Action/Plan: Pt remains intubated currently.  Will follow for discharge needs as pt progresses.    Expected Discharge Date:                  Expected Discharge Plan:     In-House Referral:     Discharge planning Services  CM Consult  Post Acute Care Choice:    Choice offered to:     DME Arranged:    DME Agency:     HH Arranged:    HH Agency:     Status of Service:  In process, will continue to follow  If discussed at Long Length of Stay Meetings, dates discussed:    Additional Comments:  Reinaldo Raddle, RN, BSN  Trauma/Neuro ICU Case Manager [phone]

## 2017-08-22 NOTE — Progress Notes (Signed)
RT note- Patient taken to MRI and now back in room, on current charted settings.

## 2017-08-22 NOTE — Progress Notes (Signed)
SLP Cancellation Note  Patient Details Name: HODAYA CURTO MRN: [id number] DOB: 1961-08-04   Cancelled treatment:       Reason Eval/Treat Not Completed: Medical issues which prohibited therapy. Pt intubated, will follow for readiness.    Jeris Easterly, Katherene Ponto 08/22/2017, 7:50 AM

## 2017-08-23 ENCOUNTER — Inpatient Hospital Stay (HOSPITAL_COMMUNITY): Payer: Self-pay

## 2017-08-23 ENCOUNTER — Encounter (HOSPITAL_COMMUNITY): Payer: Self-pay | Admitting: Interventional Radiology

## 2017-08-23 DIAGNOSIS — I639 Cerebral infarction, unspecified: Secondary | ICD-10-CM

## 2017-08-23 HISTORY — DX: Cerebral infarction, unspecified: I63.9

## 2017-08-23 LAB — GLUCOSE, CAPILLARY
GLUCOSE-CAPILLARY: 102 mg/dL — AB (ref 65–99)
GLUCOSE-CAPILLARY: 105 mg/dL — AB (ref 65–99)
GLUCOSE-CAPILLARY: 85 mg/dL (ref 65–99)
Glucose-Capillary: 121 mg/dL — ABNORMAL HIGH (ref 65–99)
Glucose-Capillary: 129 mg/dL — ABNORMAL HIGH (ref 65–99)
Glucose-Capillary: 98 mg/dL (ref 65–99)

## 2017-08-23 LAB — BASIC METABOLIC PANEL
Anion gap: 5 (ref 5–15)
BUN: 12 mg/dL (ref 6–20)
CO2: 23 mmol/L (ref 22–32)
Calcium: 8.2 mg/dL — ABNORMAL LOW (ref 8.9–10.3)
Chloride: 113 mmol/L — ABNORMAL HIGH (ref 101–111)
Creatinine, Ser: 0.48 mg/dL (ref 0.44–1.00)
GFR calc Af Amer: >60 mL/min (ref >60)
GFR calc non Af Amer: >60 mL/min (ref >60)
Glucose, Bld: 119 mg/dL — ABNORMAL HIGH (ref 65–99)
Potassium: 3.5 mmol/L (ref 3.5–5.1)
Sodium: 141 mmol/L (ref 135–145)

## 2017-08-23 LAB — ANTINUCLEAR ANTIBODIES, IFA: ANTINUCLEAR ANTIBODIES, IFA: NEGATIVE

## 2017-08-23 LAB — CBC
HCT: 39.6 % (ref 36.0–46.0)
Hemoglobin: 12.8 g/dL (ref 12.0–15.0)
MCH: 31.3 pg (ref 26.0–34.0)
MCHC: 32.3 g/dL (ref 30.0–36.0)
MCV: 96.8 fL (ref 78.0–100.0)
Platelets: 361 10*3/uL (ref 150–400)
RBC: 4.09 MIL/uL (ref 3.87–5.11)
RDW: 14.2 % (ref 11.5–15.5)
WBC: 15.7 10*3/uL — ABNORMAL HIGH (ref 4.0–10.5)

## 2017-08-23 MED ORDER — ASPIRIN 300 MG RE SUPP
300.0000 mg | Freq: Every day | RECTAL | Status: DC
  Filled 2017-08-23: qty 1

## 2017-08-23 MED ORDER — ASPIRIN 300 MG RE SUPP
300.0000 mg | Freq: Every day | RECTAL | Status: DC
  Administered 2017-08-23 – 2017-08-24 (×2): 300 mg via RECTAL
  Filled 2017-08-23: qty 1

## 2017-08-23 MED ORDER — CHLORHEXIDINE GLUCONATE 0.12 % MT SOLN
OROMUCOSAL | Status: AC
  Filled 2017-08-23: qty 15

## 2017-08-23 NOTE — Progress Notes (Signed)
Bilateral lower extremity venous duplex has been completed. Negative for DVT.  08/23/17 10:46 AM Carlos Levering RVT

## 2017-08-23 NOTE — Progress Notes (Signed)
   08/23/17 0400  Clinical Encounter Type  Visited With Family  Visit Type Spiritual support  Consult/Referral To Chaplain  Spiritual Encounters  Spiritual Needs Emotional;Prayer;Grief support  Stress Factors  Family Stress Factors Major life changes;Family relationships  Chaplain was sought out by husband of PT.  We went into consult room to talk.  He is dealing with stress associated with PT Dx and Dx of his mother, discussed loss of his father, faith life and his ability to press prayers into action.  Husband of PT cries at wife's bedside, she is his rock.

## 2017-08-23 NOTE — Progress Notes (Signed)
STROKE TEAM PROGRESS NOTE   SUBJECTIVE (INTERVAL HISTORY)   Her husband is at the bedside. Patient is found laying in bed in NAD, extubated but remains aphasic with dense right hemiplegia No new events reported overnight per nursing.  OBJECTIVE Lab Results: CBC:  Recent Labs  Lab 08/21/17 1829 08/21/17 1843 08/22/17 0811 08/23/17 0445  WBC 13.2*  --  17.1* 15.7*  HGB 16.2* 17.7* 14.3 12.8  HCT 49.9* 52.0* 42.9 39.6  MCV 98.4  --  95.5 96.8  PLT 398  --  368 361   BMP: Recent Labs  Lab 08/21/17 1829 08/21/17 1843 08/22/17 0811 08/22/17 1016 08/23/17 0445  NA 133* 140 137  --  141  K 3.6 3.7 3.8  --  3.5  CL 101 102 107  --  113*  CO2 26  --  23  --  23  GLUCOSE 96 94 148*  --  119*  BUN 17 18 13   --  12  CREATININE 0.69 0.70 0.58  --  0.48  CALCIUM 9.6  --  8.3*  --  8.2*  MG  --   --   --  1.9  --   PHOS  --   --   --  3.7  --    Liver Function Tests:  Recent Labs  Lab 08/21/17 1829  AST 32  ALT 27  ALKPHOS 73  BILITOT 0.4  PROT 8.3*  ALBUMIN 4.5   Cardiac Enzymes:  Recent Labs  Lab 08/22/17 1016  TROPONINI 0.03*   Coagulation Studies:  Recent Labs    08/21/17 1829  APTT 41*  INR 0.85   Alcohol Level:  Recent Labs  Lab 08/21/17 [place] <10    PHYSICAL EXAM Temp:  [97.3 F (36.3 C)-99.7 F (37.6 C)] 99.4 F (37.4 C) (11/28 1600) Pulse Rate:  [102-128] 126 (11/28 1400) Resp:  [14-25] 16 (11/28 1400) BP: (100-139)/(50-87) 109/62 (11/28 1400) SpO2:  [86 %-100 %] 86 % (11/28 1400) Arterial Line BP: (58-161)/(35-99) 69/58 (11/28 1000) FiO2 (%):  [40 %] 40 % (11/28 0828) General - obese middle-age Caucasian lady who is   in no apparent distress but has resting tachycardia Respiratory - Lungs clear bilaterally. No wheezing. Cardiovascular - Regular rate and rhythm   Neuro: Mental Status: Patient is awake aphasic, mute  and not following commands Cranial Nerves: II: Does not blink to threat from the right.  Pupils are equal, round,  and reactive to light.   III,IV, VI: Left gaze preference V: Facial sensation appears symmetric  VII: Facial movement is weak on the right Motor: She has a flaccid right hemiparesis, with no movement in the right arm in flexion to noxious stimuli in the right leg.  She moves her left side voluntarily with full strength. Sensory: She does flinch to noxious stimuli, but this is reduced on the right side. Cerebellar: She does not perform  IMAGING: I have personally reviewed the radiological images below and agree with the radiology interpretations.  Ct Angio Head & Neck W Or Wo Contrast Result Date: 08/21/2017 IMPRESSION: 1. Positive for emergent large vessel occlusion. No flow seen in the left M1 and distal intracranial LICA. There is flow seen in the left M2/3 branches suggesting favorable collaterals. Aspects scored on prior noncontrast head CT. 2. Severe atheromatous stenosis at the left ICA bulb which may be an embolic source. There is comparatively poor flow between the stenosis and intracranial occlusion. 3. ~ 40% right ICA bulb atheromatous narrowing. 4. No  flow seen in the congenitally diminutive proximal left vertebral artery. There is good flow in the distal left V4 segment and left PICA, which may be retrograde. 5. Advanced atherosclerosis of the aortic arch with irregular plaque/thrombus at the isthmus. Motion affects visualization of the great vessel ostia.   Ct Head Wo Contrast Result Date: 08/21/2017 IMPRESSION: Normal head CT. No intraparenchymal hemorrhage, contrast staining or cytotoxic edema.   Portable Chest Xray Result Date: 08/22/2017 IMPRESSION: 1.  Stable lines and tubes. 2. Stable ventilation with mild left lung base atelectasis.   Portable Chest Xray Result Date: 08/21/2017  IMPRESSION: Endotracheal tube tip about 2.9 cm superior to carina. Patchy atelectasis versus minimal infiltrate at the left base.  Ct Head Code Stroke Wo Contrast Result Date:  08/21/2017 IMPRESSION: Hyperdense distal left ICA and M1 segment consistent with thrombus in this setting. Small acute infarcts seen in the left insula, left putamen, and possibly left parietal cortex ASPECTS is 7 to 8.   Echocardiogram: not done                                    PENDING B/L LE Duplex:                                                       Negative for DVT   MRI Brain:                                                               Acute infarction involving left temporal operculum, left posterior insula, left posterior lentiform nucleus, and extending into corona radiata and caudate body, approximately 13 cc. Additional small foci of acute infarction are present in left posterior temporal, left frontal, left parietal, and left occipital lobes. CT Head:                                                                PENDING _____________________________________________________________________ ASSESSMENT: [name] is a 56 y.o. female with PMH of no significant past medical history (has not been to a physician in 15 years) who presents with right-sided weakness that started sometime between 3:15 and 5:15 PM. Her husband left her around 3:15 PM and then her brother found her on the floor at 5:15 PM.  She was taken to [facility] where she was administered IV TPA.  CTA was performed which demonstrated left distal ICA/MCA occlusion.  She was within the 6-hour window for IR intervention, and was taken directly to the Cath Lab.S/P bilateral common carotid and RT vert arteriograms,followed by complete revascularization of occluded Lt ICA terminus ,Lt MCA M! 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  Suspected Etiology: Likely cardioembolic source  Resultant Symptoms: aphasia,  right sided weakness and left gaze preference Stroke Risk Factors: hyperlipidemia, hypertension and no medical care for greater than 15 years Other Stroke Risk Factors: Advanced age, Cigarette smoker,   Outstanding Stroke Work-up Studies: Echocardiogram:                                    Left ventricle: The cavity size was normal. Wall thickness was   increased in a pattern of mild LVH. Systolic function was normal.   The estimated ejection fraction was in the range of 55% to 60%.   Wall motion was normal; there were no regional wall motion   abnormalities. The study is not technically sufficient to allow   evaluation of LV diastolic function B/L LE Duplex:     No DVT                                                      PLAN  08/23/2017:  Start ASA  Started Atorvastatin 80 mg UDS pending Will check TSH, ANA, ESR, Antiphospholipid and LE dopplers Monitor imaging IR sheath removed today SBT today per CCM Initiate PT/OT/SLP once extubated Ongoing aggressive stroke risk factor management  Leukocytosis Remains afebrile U/A pending CXR stable with mild left lung base atelectasis.  Repeat labs in AM Management per CCM - Appreciate management  R/O AFIB: May need Outpatient TEE and Loop Recorder Placement prior to discharge Will continue to monitor closely   DYSPHAGIA: NPO until passes SLP swallow evaluation, once extubated  HYPERTENSION EMERGENCY: Stable, remains sedated/intubated on mechanical ventiation S/P Thrombectomy - SBP Parameters per IR Nicardipine drip and Labetolol PRN in progress Long term BP goal normotensive. Home Meds: NONE  HYPERLIPIDEMIA:    Component Value Date/Time   CHOL 237 (H) 08/22/2017 0811   TRIG 295 (H) 08/22/2017 0811   TRIG 301 (H) 08/22/2017 0811   HDL 35 (L) 08/22/2017 0811   CHOLHDL 6.8 08/22/2017 0811   VLDL 59 (H) 08/22/2017 0811   LDLCALC 143 (H) 08/22/2017 0811  Home Meds:  NONE LDL  goal < 70 Started on Lipitor to 80 mg  daily Continue statin at discharge  R/O DIABETES: Lab Results  Component Value Date   HGBA1C 5.5 08/22/2017  HgbA1c goal < 7.0   TOBACCO ABUSE Current smoker Smoking cessation counseling will be provided, once extubated Nicotine patch provided PRN  Other Active Problems: Active Problems:   Acute embolic stroke (HCC)   Endotracheally intubated   Acute respiratory insufficiency - requiring mechanical ventilation, Management per [facility] day # 2  VTE prophylaxis: Lovenox / SCD's / Heparin /Xarelto Diet : Diet NPO time specified   Prior Home Stroke Medications: No antithrombotic Prior to Admission  Hospital Current Stroke Medications: NONE, at this time Stroke New Meds Plan: Now on aspirinc  Discharge Stroke Meds: Please discharge patient on Aspirin  Disposition: Final discharge disposition not confirmed Therapy Recs:  PENDING Follow Recs:  Follow-up Information    Garvin Fila, MD. Schedule an appointment as soon as possible for a visit in 6 week(s).   Specialties:  Neurology, Radiology Contact information: [address] [place] [address] [phone]  Patient, No Pcp Per - Case Management consulted today Follow up with [facility] in 6 weeks  FAMILY UPDATES: Husband and son at bedside  TEAM UPDATES: [place] Team    I have personally examined this patient, reviewed notes, independently viewed imaging studies, participated in medical decision making and plan of care.ROS completed by me personally and pertinent positives fully documented  I have made any additions or clarifications directly to the above note. Agree with note above. . She presented with terminal left ICA and middle cerebral artery occlusion and underwent emergent mechanical thrombectomy with complete revascularization. Continue strict blood pressure control and close neurological monitoring. Check swallow eval today. May need panda tube placement  for tube feeding and medications. Discussed with Dr. Sabra Heck and Dr. Estanislado Pandy. and patient's husband. Etiology of resting tachycardia unclear. Plan to check CBC, BMP and chest x-ray tomorrow morning. Have a low threshold for antibiotics if she spikes temperature This patient is critically ill and at significant risk of neurological worsening, death and care requires constant monitoring of vital signs, hemodynamics,respiratory and cardiac monitoring, extensive review of multiple databases, frequent neurological assessment, discussion with family, other specialists and medical decision making of high complexity.I have made any additions or clarifications directly to the above note.This critical care time does not reflect procedure time, or teaching time or supervisory time of PA/NP/Med Resident etc but could involve care discussion time.  I spent 70mnutes of neurocritical care time  in the care of  this patient.     PAntony Contras MD Medical Director [facility] Pager: [phone]/28/2018 5:00 PM  To contact Stroke Continuity provider, please refer to [email or web address] After hours, contact General Neurology

## 2017-08-23 NOTE — Progress Notes (Signed)
PT Cancellation Note  Patient Details Name: [name] MRN: [id number] DOB: 1961-04-15   Cancelled Treatment:    Reason Eval/Treat Not Completed: Patient not medically ready, remains on bedrest   Duncan Dull 08/23/2017, 8:22 AM

## 2017-08-23 NOTE — Anesthesia Postprocedure Evaluation (Signed)
Anesthesia Post Note  Patient: [name]  Procedure(s) Performed: RADIOLOGY WITH ANESTHESIA (N/A )     Patient location during evaluation: SICU Anesthesia Type: General Level of consciousness: sedated Pain management: pain level controlled Vital Signs Assessment: post-procedure vital signs reviewed and stable Respiratory status: patient remains intubated per anesthesia plan Cardiovascular status: stable Postop Assessment: no apparent nausea or vomiting Anesthetic complications: no    Last Vitals:  Vitals:   08/23/17 1015 08/23/17 1030  BP: (!) 115/57 (!) 110/58  Pulse: (!) 123 (!) 123  Resp: (!) 24 19  Temp:    SpO2: 99% 98%    Last Pain:  Vitals:   08/23/17 0800  TempSrc: Axillary  PainSc:                  [name] DAVID

## 2017-08-23 NOTE — Procedures (Signed)
Extubation Procedure Note  Patient Details:   Name: [name] DOB: [birth year] MRN: [id number]   Airway Documentation:     Evaluation  O2 sats: stable throughout Complications: No apparent complications Patient did tolerate procedure well. Bilateral Breath Sounds: (slight coarse)   Unable to assess vocalization due to mental status.   Pt extubated per MD order. Able to breathe around deflated cuff. No stridor noted. Unable to perform IS due to Mental Status. Will attempt at a later time. VS stable at this time. Placed patient on 6L [place] and tolerating well.   Irineo Axon [facility] 08/23/2017, 10:06 AM

## 2017-08-23 NOTE — Progress Notes (Signed)
PULMONARY / CRITICAL CARE MEDICINE   Name: [name] MRN: [id number] DOB: 1960/10/13    ADMISSION DATE:  08/21/2017   REFERRING MD:  Dr. Leonel Ramsay  CHIEF COMPLAINT:  stroke  HISTORY OF PRESENT ILLNESS:   56 year old right handed female with no significant PMH/ has not seen physican in 15 years who presented to [facility] with acute onset of aphasia, right sided weakness and left gaze 11/26.  LSW at 1515.  CT head negative for ICH however CTA showed left MCA and left ICA occlusion.  TPA given at [place]. Transferred to [facility] for endovascular revascularization.  Intubated for procedure.  CCM consulted for ventilator management.     PAST MEDICAL HISTORY :  She  has no past medical history on file.  PAST SURGICAL HISTORY: She  has a past surgical history that includes Radiology with anesthesia (N/A, 08/21/2017).  No Known Allergies  No current facility-administered medications on file prior to encounter.    Current Outpatient Medications on File Prior to Encounter  Medication Sig  . loratadine (CLARITIN) 10 MG tablet Take 10 mg by mouth daily.    SUBJECTIVE:  Patient intubated, sedated. RASS of -2. On 30ug/kg/min of propofol.  VITAL SIGNS: BP (!) 139/55   Pulse (!) 116   Temp 98.6 F (37 C) (Oral)   Resp 16   Ht 5\' 2"  (1.575 m)   Wt 60.8 kg (134 lb)   LMP  (LMP Unknown)   SpO2 99%   BMI 24.51 kg/m   HEMODYNAMICS:    VENTILATOR SETTINGS: Vent Mode: CPAP;PSV FiO2 (%):  [40 %] 40 % Set Rate:  [16 bmp] 16 bmp Vt Set:  [400 mL] 400 mL PEEP:  [5 cmH20] 5 cmH20 Pressure Support:  [5 cmH20] 5 cmH20 Plateau Pressure:  [12 cmH20-14 cmH20] 13 cmH20  INTAKE / OUTPUT: I/O last 3 completed shifts: In: 6185.9 [I.V.:5685.9; Blood:500] Out: 8563 [Urine:3840; Blood:200]  PHYSICAL EXAMINATION: General:  Sedated. Propofol at 20ug/kg/min Neuro:  RASS of -2. Can not adequately assess motor function. HEENT:  PERL. ETT, OGT. No jvd or icterus. Dried blood in the  subglottic catheter. No active bleeding in the ETT. Cardiovascular:  Normal s1s2, regular rhythm. No murmur or gallop. Lungs:  Bilateral breath sounds. Clear to auscultation. No wheezes, crackles or rhonchi. Abdomen:  Flat. No muscle guarding. No organomegaly. Musculoskeletal:  +2 pulses, right radial, bilateral DP's. Left femoral arterial site bandaged. No active bleeding. Left radial artery catheter. Skin: Warm without mottling.  Bruise noted near the left femoral catheter sheath site. LABS:  BMET Recent Labs  Lab 08/21/17 1829 08/21/17 1843 08/22/17 0811 08/23/17 0445  NA 133* 140 137 141  K 3.6 3.7 3.8 3.5  CL 101 102 107 113*  CO2 26  --  23 23  BUN 17 18 13 12   CREATININE 0.69 0.70 0.58 0.48  GLUCOSE 96 94 148* 119*    Electrolytes Recent Labs  Lab 08/21/17 1829 08/22/17 0811 08/22/17 1016 08/23/17 0445  CALCIUM 9.6 8.3*  --  8.2*  MG  --   --  1.9  --   PHOS  --   --  3.7  --     CBC Recent Labs  Lab 08/21/17 1829 08/21/17 1843 08/22/17 0811 08/23/17 0445  WBC 13.2*  --  17.1* 15.7*  HGB 16.2* 17.7* 14.3 12.8  HCT 49.9* 52.0* 42.9 39.6  PLT 398  --  368 361    Coag's Recent Labs  Lab 08/21/17 1829  APTT 41*  INR 0.85    Sepsis Markers Recent Labs  Lab 08/21/17 1839  LATICACIDVEN 0.65    ABG Recent Labs  Lab 08/21/17 0008 08/22/17 0850  PHART 7.283* 7.344*  PCO2ART 49.0* 42.0  PO2ART 97.9 82.6*    Liver Enzymes Recent Labs  Lab 08/21/17 1829  AST 32  ALT 27  ALKPHOS 73  BILITOT 0.4  ALBUMIN 4.5    Cardiac Enzymes Recent Labs  Lab 08/22/17 1016  TROPONINI 0.03*    Glucose Recent Labs  Lab 08/22/17 1154 08/22/17 1526 08/22/17 1956 08/22/17 2325 08/23/17 0330 08/23/17 0806  GLUCAP 124* 141* 132* 113* 121* 129*    Imaging Mr Brain Wo Contrast  Result Date: 08/22/2017 CLINICAL DATA:  56 y/o  F; post tPA stroke follow-up. EXAM: MRI HEAD WITHOUT CONTRAST TECHNIQUE: Multiplanar, multiecho pulse sequences of the  brain and surrounding structures were obtained without intravenous contrast. COMPARISON:  08/21/2017 CT of head and CT angiogram of head. 08/21/2017 cerebral angiogram. FINDINGS: Brain: Primary region of diffusion involving left temporal operculum, left posterior insula, left posterior lentiform nucleus, extending into corona radiata and caudate body compatible with acute infarction. The infarct spans approximately 2.7 x 2.0 x 4.5 cm (volume = 13 cm^3). Small cortical foci of reduced diffusion are present in left parietal, left posterior temporal, and left frontal lobes. Additionally there are subcentimeter foci of reduced diffusion in left frontal centrum semiovale and the left occipital lobe. Background of mild chronic microvascular ischemic changes and parenchymal volume loss of the brain. Punctate foci of susceptibility are present within the bilateral parietal white matter compatible hemosiderin deposition of microhemorrhage. No mass effect, hydrocephalus, or extra-axial collection. Vascular: Normal central flow voids including left proximal MCA. Skull and upper cervical spine: Normal marrow signal. Sinuses/Orbits: Mild diffuse paranasal sinus mucosal thickening. Otherwise negative. Other: None. IMPRESSION: 1. Acute infarction involving left temporal operculum, left posterior insula, left posterior lentiform nucleus, and extending into corona radiata and caudate body, approximately 13 cc. Additional small foci of acute infarction are present in left posterior temporal, left frontal, left parietal, and left occipital lobes. 2. Biparietal white matter microhemorrhage, probably chronic. 3. Mild chronic microvascular ischemic changes and mild parenchymal volume loss of the brain. 4. Mild paranasal sinus disease. Electronically Signed   By: Kristine Garbe M.D.   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 And M2 segs with x1 pass with solitaire 37mm x 40 mm FR  retrieval device achieving TICI 3 repetfusion.             CULTURES: None  ANTIBIOTICS: None  SIGNIFICANT EVENTS: S/p medical embolectomy.  LINES/TUBES: Left femoral sheath 11/26 Left radial artery 11/26  DISCUSSION: 56 y.o. Female with presentation of L MCA stroke, s/p tPA and medical embolectomy.  ASSESSMENT / PLAN:  PULMONARY A: Acute respiratory failure in the setting of EVR/ CVA P:   Patient to undergo sheath removal and MRI brain. Neurology to then decide on possible extubation.  Will discuss today. VAP protocol SLP after  extubation  Duoneb prn   CARDIOVASCULAR A:  CVA - neg troponin, EKG nonacute - lactic acid 0.65 P:  Tele monitoring Continue cardene for goal SBP 120-140.  We will likely switch to p.o. medication after extubation. Discontinue arterial line after extubation. Assess TTE Lipid panel per stroke workup   RENAL A:   No acute issues P:   Assess mag/ phos; pending. Replace electrolytes as indicated Avoid nephrotoxic agents, ensure adequate renal perfusion  GASTROINTESTINAL A:   NPO - normal LFTs P:   OGT PPI for SUP Bowel regimen for PAD protocol  HEMATOLOGIC A:   Leukocytosis- mild, likely reactive Elevated H/H P:  Trend CBC No anticoagulation x 24 hr s/p TPA HIV ordered   INFECTIOUS A:   Leukocytosis- likely due to stroke P:   Chest xray with possible LLL atelectasis. U/A is normal Leukocytosis improving.   ENDOCRINE A:   No acute issues P:   CBG q 4hr TSH and HgbA1c  normal  NEUROLOGIC A:   Acute L MCA and L ICA occulusion s/p TPA and EVR with complete revascularization s/p mechanical thrombectomy  - presented with aphasia, right sided weakness and left gaze P:   RASS goal: -1/-2. Await decision regarding extubation. PAD protocol with propofol and prn fentanyl Assess triglycerides Strict SBP management as above  UDS pending SLP/ PT/ OT consults when appropriate    FAMILY  - Updates: Husband at the bedside. Informed of plan for MRI, possible extubation today.  - Inter-disciplinary family meet or Palliative Care meeting due by: 08/28/2017  CRITICAL CARE Performed by: Jesus Genera   Total critical care time: 40 minutes  Critical care time was exclusive of separately billable procedures and treating other patients.  Critical care was necessary to treat or prevent imminent or life-threatening deterioration.  Critical care was time spent personally by me on the following activities: development of treatment plan with patient and/or surrogate as well as nursing, discussions with consultants, evaluation of patient's response to treatment, examination of patient, obtaining history from patient or surrogate, ordering and performing treatments and interventions, ordering and review of laboratory studies, ordering and review of radiographic studies, pulse oximetry and re-evaluation of patient's condition. .     Pulmonary and [place] Pager: [phone]  08/23/2017, 8:59 AM

## 2017-08-23 NOTE — Progress Notes (Signed)
Chief Complaint: Patient was seen today for follow up CVA s/p intervention   Supervising Physician: Luanne Bras  Patient Status: [facility] - In-pt  Subjective: Significant (L)sided CVA s/p bilateral common carotid and RT vert arteriograms,followed by complete revascularization of occluded Lt ICA terminus ,Lt MCA M! And M2 segs with x1 pass with solitaire 37m x 40 mm FR retrieval device achieving TICI 3 repetfusion Pt now extubated, still sleepy from sedation (L)groin sheath removed yesterday without issue   Objective: Physical Exam: BP (!) 110/58   Pulse (!) 123   Temp (!) 97.3 F (36.3 C) (Axillary)   Resp 19   Ht _0  (1.575 m)   Wt 134 lb (60.8 kg)   LMP  (LMP Unknown)   SpO2 98%   BMI 24.51 kg/m  Extubated but somnolent. Difficult to arouse, not following commands (L)groin soft, NT, no hematoma. Feet warm, palpable pulses   Current Facility-Administered Medications:  .  [COMPLETED] alteplase (ACTIVASE) 1 mg/mL infusion 55 mg, 0.9 mg/kg, Intravenous, Once, Stopped at 08/21/17 2015 **FOLLOWED BY** 0.9 %  sodium chloride infusion, 50 mL, Intravenous, Once, Absalom, Nicholas, MD .  0.9 %  sodium chloride infusion, , Intravenous, Continuous, Deveshwar, Sanjeev, MD, Last Rate: 75 mL/hr at 08/23/17 1000 .  acetaminophen (TYLENOL) tablet 650 mg, 650 mg, Oral, Q4H PRN **OR** acetaminophen (TYLENOL) solution 650 mg, 650 mg, Per Tube, Q4H PRN **OR** acetaminophen (TYLENOL) suppository 650 mg, 650 mg, Rectal, Q4H PRN, KGreta Doom MD .  atorvastatin (LIPITOR) tablet 80 mg, 80 mg, Oral, q1800, Costello, Mary A, NP, 80 mg at 08/22/17 1815 .  bisacodyl (DULCOLAX) suppository 10 mg, 10 mg, Rectal, Daily PRN, SJennelle HumanB, NP .  chlorhexidine gluconate (MEDLINE KIT) (PERIDEX) 0.12 % solution 15 mL, 15 mL, Mouth Rinse, BID, Simpson, Paula B, NP, 15 mL at 08/23/17 0849 .  docusate (COLACE) 50 MG/5ML liquid 100 mg, 100 mg, Per Tube, BID PRN, SJennelle HumanB, NP .   fentaNYL (SUBLIMAZE) injection 25-100 mcg, 25-100 mcg, Intravenous, Q1H PRN, NJavier Glazier MD, 100 mcg at 08/23/17 0130 .  ipratropium-albuterol (DUONEB) 0.5-2.5 (3) MG/3ML nebulizer solution 3 mL, 3 mL, Nebulization, Q6H PRN, SJennelle HumanB, NP .  MEDLINE mouth rinse, 15 mL, Mouth Rinse, QID, Simpson, Paula B, NP, 15 mL at 08/23/17 0330 .  midazolam (VERSED) injection 1-4 mg, 1-4 mg, Intravenous, Q1H PRN, NJavier Glazier MD, 2 mg at 08/23/17 0646 .  nicardipine (CARDENE) 27min 0.86% saline 20015mV infusion (0.1 mg/ml), 0-15 mg/hr, Intravenous, Continuous, Deveshwar, Sanjeev, MD, Stopped at 08/23/17 1025 .  nicotine (NICODERM CQ - dosed in mg/24 hours) patch 14 mg, 14 mg, Transdermal, Daily PRN, Costello, Mary A, NP .  pantoprazole (PROTONIX) injection 40 mg, 40 mg, Intravenous, QHS, KirGreta DoomD, 40 mg at 08/22/17 2108 .  phenylephrine (NEO-SYNEPHRINE) 10 mg in sodium chloride 0.9 % 250 mL (0.04 mg/mL) infusion, 0-100 mcg/min, Intravenous, Titrated, Nestor, JenSonia BallerD, Stopped at 08/22/17 0156 .  propofol (DIPRIVAN) 1000 MG/100ML infusion, 0-50 mcg/kg/min, Intravenous, Continuous, SimJennelle Human NP, Stopped at 08/23/17 0915 .  ticagrelor (BRILINTA) tablet 180 mg, 180 mg, Oral, Once, DevLuanne BrasD  Labs: CBC Recent Labs    08/22/17 0811 08/23/17 0445  WBC 17.1* 15.7*  HGB 14.3 12.8  HCT 42.9 39.6  PLT 368 361   BMET Recent Labs    08/22/17 0811 08/23/17 0445  NA 137 141  K 3.8 3.5  CL 107 113*  CO2 23  23  GLUCOSE 148* 119*  BUN 13 12  CREATININE 0.58 0.48  CALCIUM 8.3* 8.2*   LFT Recent Labs    08/21/17 1829  PROT 8.3*  ALBUMIN 4.5  AST 32  ALT 27  ALKPHOS 73  BILITOT 0.4   PT/INR Recent Labs    08/21/17 1829  LABPROT 11.5  INR 0.85     Studies/Results: Ct Angio Head W Or Wo Contrast  Result Date: 08/21/2017 CLINICAL DATA:  Left MCA syndrome.  Abnormal head CT. EXAM: CT ANGIOGRAPHY HEAD AND NECK TECHNIQUE:  Multidetector CT imaging of the head and neck was performed using the standard protocol during bolus administration of intravenous contrast. Multiplanar CT image reconstructions and MIPs were obtained to evaluate the vascular anatomy. Carotid stenosis measurements (when applicable) are obtained utilizing NASCET criteria, using the distal internal carotid diameter as the denominator. CONTRAST:  36m ISOVUE-370 IOPAMIDOL (ISOVUE-370) INJECTION 76% COMPARISON:  None. FINDINGS: CTA NECK FINDINGS Aortic arch: Extensive atheromatous thickening and calcification of the arch, especially for age. Low-density plaque or luminal thrombus seen at the isthmus. There is degradation by motion artifact. Right carotid system: Atheromatous plaque on the ventral distal common carotid. There is mildly irregular plaque at the ICA bulb with 40% proximal ICA stenosis. Negative for dissection. Left carotid system: Motion affects visualization of the proximal common carotid. There is atheromatous wall thickening of the ventral distal common carotid wall. Plaque at the common carotid bifurcation and ICA bulb with severe stenosis at the ICA bulb, with downstream underfilling. This is a likely source of embolism. No superimposed dissection. Vertebral arteries: Motion affects visualization of the proximal subclavian artery on the left. There is likely mild ostial stenosis. No visible left vertebral flow until the V2 segment. There is strong dominance of the right vertebral artery without superimposed flow limiting stenosis. There is scattered atherosclerotic plaque on the right vertebral artery. Skeleton: No acute or aggressive finding. Other neck: No incidental mass or adenopathy. Upper chest: No acute finding Review of the MIP images confirms the above findings CTA HEAD FINDINGS Anterior circulation: Faint flow seen in the left proximal intracranial ICA, with none by the level of the anterior genu. Occluded left MCA with distal opacification  beginning at the M2/3 level. There is a robust anterior communicating artery and good bilateral A2 and distal flow. No right-sided branch occlusion is seen. Posterior circulation: Diminutive left vertebral artery, especially before the PICA. The distal left V4 segment may be flow in retrograde. Negative for branch occlusion or flow limiting stenosis. Venous sinuses: As permitted by contrast timing, patent. Anatomic variants: None significant Delayed phase: Not performed in the emergent setting. See head CT for record of communication. Review of the MIP images confirms the above findings IMPRESSION: 1. Positive for emergent large vessel occlusion. No flow seen in the left M1 and distal intracranial LICA. There is flow seen in the left M2/3 branches suggesting favorable collaterals. Aspects scored on prior noncontrast head CT. 2. Severe atheromatous stenosis at the left ICA bulb which may be an embolic source. There is comparatively poor flow between the stenosis and intracranial occlusion. 3. ~ 40% right ICA bulb atheromatous narrowing. 4. 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 There is good flow in the distal left V4 segment and left PICA, which may be retrograde. 5. Advanced atherosclerosis of the aortic arch with irregular plaque/thrombus at the isthmus. Motion affects visualization of the great vessel ostia. Electronically Signed   By: JNeva SeatD.  On: 08/21/2017 19:03   Ct Head Wo Contrast  Result Date: 08/21/2017 CLINICAL DATA:  Status post stroke intervention EXAM: CT HEAD WITHOUT CONTRAST TECHNIQUE: Contiguous axial images were obtained from the base of the skull through the vertex without intravenous contrast. COMPARISON:  Head CT and CTA 08/21/2017 FINDINGS: Brain: No mass lesion, intraparenchymal hemorrhage or extra-axial collection. No evidence of acute cortical infarct. Brain parenchyma and CSF-containing spaces are normal for age. Vascular: No  hyperdense vessel or unexpected calcification. Intravenous contrast persist within the vessels. Skull: Normal visualized skull base, calvarium and extracranial soft tissues. Sinuses/Orbits: No sinus fluid levels or advanced mucosal thickening. No mastoid effusion. Normal orbits. IMPRESSION: Normal head CT. No intraparenchymal hemorrhage, contrast staining or cytotoxic edema. Electronically Signed   By: Ulyses Jarred M.D.   On: 08/21/2017 23:35   Ct Angio Neck W Or Wo Contrast  Result Date: 08/21/2017 CLINICAL DATA:  Left MCA syndrome.  Abnormal head CT. EXAM: CT ANGIOGRAPHY HEAD AND NECK TECHNIQUE: Multidetector CT imaging of the head and neck was performed using the standard protocol during bolus administration of intravenous contrast. Multiplanar CT image reconstructions and MIPs were obtained to evaluate the vascular anatomy. Carotid stenosis measurements (when applicable) are obtained utilizing NASCET criteria, using the distal internal carotid diameter as the denominator. CONTRAST:  4m ISOVUE-370 IOPAMIDOL (ISOVUE-370) INJECTION 76% COMPARISON:  None. FINDINGS: CTA NECK FINDINGS Aortic arch: Extensive atheromatous thickening and calcification of the arch, especially for age. Low-density plaque or luminal thrombus seen at the isthmus. There is degradation by motion artifact. Right carotid system: Atheromatous plaque on the ventral distal common carotid. There is mildly irregular plaque at the ICA bulb with 40% proximal ICA stenosis. Negative for dissection. Left carotid system: Motion affects visualization of the proximal common carotid. There is atheromatous wall thickening of the ventral distal common carotid wall. Plaque at the common carotid bifurcation and ICA bulb with severe stenosis at the ICA bulb, with downstream underfilling. This is a likely source of embolism. No superimposed dissection. Vertebral arteries: Motion affects visualization of the proximal subclavian artery on the left. There is  likely mild ostial stenosis. No visible left vertebral flow until the V2 segment. There is strong dominance of the right vertebral artery without superimposed flow limiting stenosis. There is scattered atherosclerotic plaque on the right vertebral artery. Skeleton: No acute or aggressive finding. Other neck: No incidental mass or adenopathy. Upper chest: No acute finding Review of the MIP images confirms the above findings CTA HEAD FINDINGS Anterior circulation: Faint flow seen in the left proximal intracranial ICA, with none by the level of the anterior genu. Occluded left MCA with distal opacification beginning at the M2/3 level. There is a robust anterior communicating artery and good bilateral A2 and distal flow. No right-sided branch occlusion is seen. Posterior circulation: Diminutive left vertebral artery, especially before the PICA. The distal left V4 segment may be flow in retrograde. Negative for branch occlusion or flow limiting stenosis. Venous sinuses: As permitted by contrast timing, patent. Anatomic variants: None significant Delayed phase: Not performed in the emergent setting. See head CT for record of communication. Review of the MIP images confirms the above findings IMPRESSION: 1. Positive for emergent large vessel occlusion. No flow seen in the left M1 and distal intracranial LICA. There is flow seen in the left M2/3 branches suggesting favorable collaterals. Aspects scored on prior noncontrast head CT. 2. Severe atheromatous stenosis at the left ICA bulb which may be an embolic source. There is comparatively poor flow  between the stenosis and intracranial occlusion. 3. ~ 40% right ICA bulb atheromatous narrowing. 4. 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 There is good flow in the distal left V4 segment and left PICA, which may be retrograde. 5. Advanced atherosclerosis of the aortic arch with irregular plaque/thrombus at the isthmus. Motion affects  visualization of the great vessel ostia. Electronically Signed   By: Monte Fantasia M.D.   On: 08/21/2017 19:03   Mr Brain Wo Contrast  Result Date: 08/22/2017 CLINICAL DATA:  56 y/o  F; post tPA stroke follow-up. EXAM: MRI HEAD WITHOUT CONTRAST TECHNIQUE: Multiplanar, multiecho pulse sequences of the brain and surrounding structures were obtained without intravenous contrast. COMPARISON:  08/21/2017 CT of head and CT angiogram of head. 08/21/2017 cerebral angiogram. FINDINGS: Brain: Primary region of diffusion involving left temporal operculum, left posterior insula, left posterior lentiform nucleus, extending into corona radiata and caudate body compatible with acute infarction. The infarct spans approximately 2.7 x 2.0 x 4.5 cm (volume = 13 cm^3). Small cortical foci of reduced diffusion are present in left parietal, left posterior temporal, and left frontal lobes. Additionally there are subcentimeter foci of reduced diffusion in left frontal centrum semiovale and the left occipital lobe. Background of mild chronic microvascular ischemic changes and parenchymal volume loss of the brain. Punctate foci of susceptibility are present within the bilateral parietal white matter compatible hemosiderin deposition of microhemorrhage. No mass effect, hydrocephalus, or extra-axial collection. Vascular: Normal central flow voids including left proximal MCA. Skull and upper cervical spine: Normal marrow signal. Sinuses/Orbits: Mild diffuse paranasal sinus mucosal thickening. Otherwise negative. Other: None. IMPRESSION: 1. Acute infarction involving left temporal operculum, left posterior insula, left posterior lentiform nucleus, and extending into corona radiata and caudate body, approximately 13 cc. Additional small foci of acute infarction are present in left posterior temporal, left frontal, left parietal, and left occipital lobes. 2. Biparietal white matter microhemorrhage, probably chronic. 3. Mild chronic  microvascular ischemic changes and mild parenchymal volume loss of the brain. 4. Mild paranasal sinus disease. Electronically Signed   By: Kristine Garbe M.D.   On: 08/22/2017 17:59   Portable Chest Xray  Result Date: 08/22/2017 CLINICAL DATA:  56 year old female status post emergent large vessel occlusion - left ICA and left MCA - with endovascular revascularization. EXAM: PORTABLE CHEST 1 VIEW COMPARISON:  08/21/2017 and earlier. FINDINGS: Portable AP semi upright view at 0601 hours. Endotracheal tube tip in good position at the level the clavicles. Enteric tube courses to the abdomen. Stable lung volumes. Stable cardiac size and mediastinal contours. Mild left lung base patchy opacity is stable. Elsewhere the lungs are clear when allowing for portable technique. Paucity bowel gas in the upper abdomen. IMPRESSION: 1.  Stable lines and tubes. 2. Stable ventilation with mild left lung base atelectasis. Electronically Signed   By: Genevie Ann M.D.   On: 08/22/2017 07:44   Portable Chest Xray  Result Date: 08/21/2017 CLINICAL DATA:  ETT placement EXAM: PORTABLE CHEST 1 VIEW COMPARISON:  None FINDINGS: Endotracheal tube tip is about 2.9 cm superior to the carina. Esophageal tube tip is below the diaphragm but non included. Opacity at the left base may reflect atelectasis versus minimal infiltrate. No large effusion. Borderline heart size. Aortic atherosclerosis. No pneumothorax. IMPRESSION: Endotracheal tube tip about 2.9 cm superior to carina. Patchy atelectasis versus minimal infiltrate at the left base. Electronically Signed   By: Donavan Foil M.D.   On: 08/21/2017 23:50   Ct Head Code Stroke  Wo Contrast  Result Date: 08/21/2017 CLINICAL DATA:  Code stroke.  Code stroke.  Right-sided weakness. EXAM: CT HEAD WITHOUT CONTRAST TECHNIQUE: Contiguous axial images were obtained from the base of the skull through the vertex without intravenous contrast. COMPARISON:  None. FINDINGS: Brain: There is  low-density in the posterior left putamen and posterior left insula. Small area of left parietal cortex blurring. Negative for hemorrhage. No masslike finding or hydrocephalus. No prior injury is noted. Vascular: Hyperdense left distal ICA and MCA. Atherosclerotic calcification. Skull: No acute finding. Sinuses/Orbits: Gaze to the left. Other: Critical Value/emergent results were called by telephone at the time of interpretation on 08/21/2017 at 6:43 pm to Dr. Davonna Belling , who verbally acknowledged these results. ASPECTS [facility] Stroke Program Early CT Score) - Ganglionic level infarction (caudate, lentiform nuclei, internal capsule, insula, M1-M3 cortex): 5 - Supraganglionic infarction (M4-M6 cortex): 2 Total score (0-10 with 10 being normal): 7 IMPRESSION: Hyperdense distal left ICA and M1 segment consistent with thrombus in this setting. Small acute infarcts seen in the left insula, left putamen, and possibly left parietal cortex ASPECTS is 7 to 8. Electronically Signed   By: Monte Fantasia M.D.   On: 08/21/2017 18:46    Assessment/Plan: (L)sided CVA S/P bilateral common carotid and RT vert arteriograms,followed by complete revascularization of occluded Lt ICA terminus ,Lt MCA M! And M2 segs with x1 pass with solitaire 60m x 40 mm FR  retrieval device achieving TICI 3 repetfusion Extubated Needs to wake up a bit more for better neuro assessment.     LOS: 2 days   I spent a total of 15 minutes in face to face in clinical consultation, greater than 50% of which was counseling/coordinating care for CVA  BAscencion DikePA-C 08/23/2017 10:40 AM

## 2017-08-23 NOTE — Progress Notes (Signed)
OT Cancellation    08/23/17 0900  OT Visit Information  Last OT Received On 08/23/17  Reason Eval/Treat Not Completed Patient not medically ready. Pt on bedrest. Will return with increased in activity orders. Thank you.   [place], OTR/L Acute Rehab Pager: [phone] Office: [phone]

## 2017-08-24 ENCOUNTER — Inpatient Hospital Stay (HOSPITAL_COMMUNITY): Payer: Self-pay

## 2017-08-24 ENCOUNTER — Encounter (HOSPITAL_COMMUNITY): Payer: Self-pay | Admitting: *Deleted

## 2017-08-24 ENCOUNTER — Other Ambulatory Visit: Payer: Self-pay

## 2017-08-24 DIAGNOSIS — G8191 Hemiplegia, unspecified affecting right dominant side: Secondary | ICD-10-CM

## 2017-08-24 DIAGNOSIS — I69391 Dysphagia following cerebral infarction: Secondary | ICD-10-CM

## 2017-08-24 LAB — BASIC METABOLIC PANEL
Anion gap: 5 (ref 5–15)
BUN: 9 mg/dL (ref 6–20)
CHLORIDE: 112 mmol/L — AB (ref 101–111)
CO2: 25 mmol/L (ref 22–32)
CREATININE: 0.44 mg/dL (ref 0.44–1.00)
Calcium: 8.4 mg/dL — ABNORMAL LOW (ref 8.9–10.3)
GFR calc Af Amer: >60 mL/min (ref >60)
GFR calc non Af Amer: >60 mL/min (ref >60)
GLUCOSE: 86 mg/dL (ref 65–99)
POTASSIUM: 3.1 mmol/L — AB (ref 3.5–5.1)
Sodium: 142 mmol/L (ref 135–145)

## 2017-08-24 LAB — GLUCOSE, CAPILLARY
GLUCOSE-CAPILLARY: 86 mg/dL (ref 65–99)
Glucose-Capillary: 71 mg/dL (ref 65–99)
Glucose-Capillary: 73 mg/dL (ref 65–99)

## 2017-08-24 LAB — CBC WITH DIFFERENTIAL/PLATELET
BASOS ABS: 0 10*3/uL (ref 0.0–0.1)
BASOS PCT: 0 %
EOS ABS: 0.2 10*3/uL (ref 0.0–0.7)
EOS PCT: 1 %
HCT: 38.1 % (ref 36.0–46.0)
Hemoglobin: 12.1 g/dL (ref 12.0–15.0)
LYMPHS ABS: 1.9 10*3/uL (ref 0.7–4.0)
Lymphocytes Relative: 13 %
MCH: 31 pg (ref 26.0–34.0)
MCHC: 31.8 g/dL (ref 30.0–36.0)
MCV: 97.7 fL (ref 78.0–100.0)
Monocytes Absolute: 1.5 10*3/uL — ABNORMAL HIGH (ref 0.1–1.0)
Monocytes Relative: 10 %
Neutro Abs: 11.1 10*3/uL — ABNORMAL HIGH (ref 1.7–7.7)
Neutrophils Relative %: 76 %
PLATELETS: 304 10*3/uL (ref 150–400)
RBC: 3.9 MIL/uL (ref 3.87–5.11)
RDW: 14.2 % (ref 11.5–15.5)
WBC: 14.8 10*3/uL — AB (ref 4.0–10.5)

## 2017-08-24 LAB — ANTIPHOSPHOLIPID SYNDROME EVAL, BLD
ANTICARDIOLIPIN IGM: 11 [MPL'U]/mL (ref 0–12)
Anticardiolipin IgA: 10 APL U/mL (ref 0–11)
DRVVT: 43.2 s (ref 0.0–47.0)
PHOSPHATYDALSERINE, IGM: 15 {MPS'U} (ref 0–25)
PTT LA: 38 s (ref 0.0–51.9)
Phosphatydalserine, IgA: 22 APS IgA — ABNORMAL HIGH (ref 0–20)
Phosphatydalserine, IgG: 2 GPS IgG (ref 0–11)

## 2017-08-24 MED ORDER — POTASSIUM CHLORIDE 10 MEQ/100ML IV SOLN: 10.0000 meq | INTRAVENOUS | Status: DC | PRN

## 2017-08-24 MED ORDER — POTASSIUM CHLORIDE 10 MEQ/100ML IV SOLN
10.0000 meq | INTRAVENOUS | Status: AC
  Administered 2017-08-24 (×4): 10 meq via INTRAVENOUS
  Filled 2017-08-24 (×5): qty 100

## 2017-08-24 MED ORDER — POTASSIUM CHLORIDE CRYS ER 20 MEQ PO TBCR: 20.0000 meq | EXTENDED_RELEASE_TABLET | ORAL | Status: DC | PRN

## 2017-08-24 NOTE — Evaluation (Signed)
Speech Language Pathology Evaluation Patient Details Name: [name] MRN: [id number] DOB: [birth year] Today's Date: 08/24/2017 Time: 0932-3557 SLP Time Calculation (min) (ACUTE ONLY): 15 min  Problem List:  Patient Active Problem List   Diagnosis Date Noted  . Endotracheally intubated   . Acute respiratory insufficiency   . Acute embolic stroke (HCC) 08/21/2017   Past Medical History: History reviewed. No pertinent past medical history. Past Surgical History:  Past Surgical History:  Procedure Laterality Date  . IR ANGIO INTRA EXTRACRAN SEL COM CAROTID INNOMINATE UNI R MOD SED  08/21/2017  . IR ANGIO VERTEBRAL SEL SUBCLAVIAN INNOMINATE UNI R MOD SED  08/21/2017  . IR PERCUTANEOUS ART THROMBECTOMY/INFUSION INTRACRANIAL INC DIAG ANGIO  08/21/2017  . RADIOLOGY WITH ANESTHESIA N/A 08/21/2017   Procedure: RADIOLOGY WITH ANESTHESIA;  Surgeon: Julieanne Cotton, MD;  Location: [facility] OR;  Service: Radiology;  Laterality: N/A;   HPI:  56 year old right handed female with no significant PMH/ has not seen physican in 15 years who presented to [facility] with acute onset of aphasia, right sided weakness and left gaze 11/26. LSW at 1515. CT head negative for ICH however CTA showed left MCA and left ICA occlusion. TPA given at 1918. Transferred to [facility] for endovascular revascularization 1/26, Intubated for procedure. Extubated 11/28.    Assessment / Plan / Recommendation Clinical Impression  Patient presents with global aphasia impacting both expressive and receptive language (receptive better than expressive). Both improved with clinician cueing. Patient will benefit from SLP f/u to maximize communication. CIR consult recommended.     SLP Assessment  SLP Recommendation/Assessment: Patient needs continued Speech Lanaguage Pathology Services SLP Visit Diagnosis: Aphasia (R47.01)    Follow Up Recommendations  Inpatient Rehab    Frequency and Duration min 2x/week  2 weeks      SLP  Evaluation Cognition  Overall Cognitive Status: Impaired/Different from baseline Arousal/Alertness: Awake/alert Orientation Level: Oriented to person;Oriented to place(aphasia limiting assessment) Attention: Sustained Sustained Attention: Impaired Sustained Attention Impairment: Verbal basic Problem Solving: Appears intact(for basic self care task) Comments: to be assessed further, aphasia impacting assessment       Comprehension  Auditory Comprehension Overall Auditory Comprehension: Impaired Yes/No Questions: Impaired Basic Biographical Questions: 26-50% accurate Commands: Impaired One Step Basic Commands: 50-74% accurate Conversation: Simple Interfering Components: Attention EffectiveTechniques: Visual/Gestural cues Visual Recognition/Discrimination Discrimination: Exceptions to [facility] Common Objects: (90% accuracy, impacted by visual deficits) Reading Comprehension Reading Status: Impaired Sentence Level: (to be further assessed)    Expression Expression Primary Mode of Expression: Verbal Verbal Expression Overall Verbal Expression: Impaired Initiation: No impairment Automatic Speech: Name;Social Response Level of Generative/Spontaneous Verbalization: Phrase Repetition: Impaired Level of Impairment: Word level Naming: Impairment Responsive: Not tested Confrontation: Impaired Common Objects: (90% accuracy, impacted by visual deficits) Convergent: 25-49% accurate Divergent: Not tested Verbal Errors: Phonemic paraphasias;Neologisms;Perseveration Pragmatics: No impairment Written Expression Dominant Hand: Right   Oral / Motor  Oral Motor/Sensory Function Overall Oral Motor/Sensory Function: Moderate impairment(possible oral apraxia) Facial ROM: Reduced right;Suspected CN VII (facial) dysfunction Facial Symmetry: Abnormal symmetry right;Suspected CN VII (facial) dysfunction Facial Strength: Reduced right;Suspected CN VII (facial) dysfunction Lingual ROM: Reduced  right;Suspected CN XII (hypoglossal) dysfunction Lingual Strength: Reduced;Suspected CN XII (hypoglossal) dysfunction Velum: Within Functional Limits Mandible: Within Functional Limits Motor Speech Overall Motor Speech: Appears within functional limits for tasks assessed(although ongoing assessment will be required)   GO            Ferdinand Lango MA, CCC-SLP [phone]  [name] 08/24/2017, 2:49 PM

## 2017-08-24 NOTE — Progress Notes (Signed)
PULMONARY / CRITICAL CARE MEDICINE   Name: [name] MRN: [id number] DOB: 02-07-1961    ADMISSION DATE:  08/21/2017   REFERRING MD:  Dr. Leonel Ramsay  CHIEF COMPLAINT:  stroke  HISTORY OF PRESENT ILLNESS:   56 year old right handed female with no significant PMH/ has not seen physican in 15 years who presented to [facility] with acute onset of aphasia, right sided weakness and left gaze 11/26.  LSW at 1515.  CT head negative for ICH however CTA showed left MCA and left ICA occlusion.  TPA given at [place]. Transferred to [facility] for endovascular revascularization.  Intubated for procedure.  CCM consulted for ventilator management.     PAST MEDICAL HISTORY :  She  has no past medical history on file.  PAST SURGICAL HISTORY: She  has a past surgical history that includes Radiology with anesthesia (N/A, 08/21/2017); IR PERCUTANEOUS ART THROMBECTOMY/INFUSION INTRACRANIAL INC DIAG ANGIO (08/21/2017); IR ANGIO INTRA EXTRACRAN SEL COM CAROTID INNOMINATE UNI R MOD SED (08/21/2017); and IR ANGIO VERTEBRAL SEL SUBCLAVIAN INNOMINATE UNI R MOD SED (08/21/2017).  No Known Allergies  No current facility-administered medications on file prior to encounter.    Current Outpatient Medications on File Prior to Encounter  Medication Sig  . loratadine (CLARITIN) 10 MG tablet Take 10 mg by mouth daily.    SUBJECTIVE:  Patient intubated, sedated. RASS of -2. On 30ug/kg/min of propofol.  VITAL SIGNS: BP (!) 145/81 (BP Location: Left Arm)   Pulse (!) 110   Temp 98.2 F (36.8 C) (Oral)   Resp 13   Ht 5\' 2"  (1.575 m)   Wt 60.8 kg (134 lb)   LMP  (LMP Unknown)   SpO2 99%   BMI 24.51 kg/m   HEMODYNAMICS:    VENTILATOR SETTINGS:    INTAKE / OUTPUT: I/O last 3 completed shifts: In: 4269.2 [I.V.:4239.2; Other:30] Out: 9233 [AQTMA:2633]  PHYSICAL EXAMINATION: General: Patient is awake.  If he is unable to speak but will follow some commands.   Neuro: Right facial droop with grimace.   Right upper extremity without motor function.  Right lower extremity withdrawal to tactile stimulation.  Left upper and lower extremity motor function approximately 4/5.   HEENT:  PERL.  No stridor. No jvd or icterus. Cardiovascular:  Normal s1s2, regular rhythm. No murmur or gallop.  Heart rate has slowed down from approximately 120-30 bpm yesterday to 100 bpm today. Lungs:  Bilateral breath sounds. Clear to auscultation. No wheezes, crackles or rhonchi. Abdomen:  Flat. No muscle guarding. No organomegaly. Musculoskeletal:  +2 pulses, right radial, bilateral DP's. Left femoral arterial site bandaged. No active bleeding.  Skin: Warm without mottling.  Bruise noted near the left femoral catheter sheath site is unchanged  lABS:  BMET Recent Labs  Lab 08/22/17 0811 08/23/17 0445 08/24/17 0556  NA 137 141 142  K 3.8 3.5 3.1*  CL 107 113* 112*  CO2 23 23 25   BUN 13 12 9   CREATININE 0.58 0.48 0.44  GLUCOSE 148* 119* 86    Electrolytes Recent Labs  Lab 08/22/17 0811 08/22/17 1016 08/23/17 0445 08/24/17 0556  CALCIUM 8.3*  --  8.2* 8.4*  MG  --  1.9  --   --   PHOS  --  3.7  --   --     CBC Recent Labs  Lab 08/22/17 0811 08/23/17 0445 08/24/17 0556  WBC 17.1* 15.7* 14.8*  HGB 14.3 12.8 12.1  HCT 42.9 39.6 38.1  PLT 368 361 304  Coag's Recent Labs  Lab 08/21/17 1829  APTT 41*  INR 0.85    Sepsis Markers Recent Labs  Lab 08/21/17 1839  LATICACIDVEN 0.65    ABG Recent Labs  Lab 08/21/17 0008 08/22/17 0850  PHART 7.283* 7.344*  PCO2ART 49.0* 42.0  PO2ART 97.9 82.6*    Liver Enzymes Recent Labs  Lab 08/21/17 1829  AST 32  ALT 27  ALKPHOS 73  BILITOT 0.4  ALBUMIN 4.5    Cardiac Enzymes Recent Labs  Lab 08/22/17 1016  TROPONINI 0.03*    Glucose Recent Labs  Lab 08/23/17 0806 08/23/17 1146 08/23/17 1623 08/23/17 1945 08/23/17 2335 08/24/17 0322  GLUCAP 129* 102* 105* 98 85 86    Imaging Dg Chest Port 1 View  Result Date:  08/24/2017 CLINICAL DATA:  Pneumonia EXAM: PORTABLE CHEST 1 VIEW COMPARISON:  08/22/2017 FINDINGS: Endotracheal and NG tubes removed. Subsegmental atelectasis at the left base increased. Dense opacity projecting over the right hilum extending into the lower lobes has increased. No pneumothorax. Cardiomegaly. IMPRESSION: Extubated Increased left basilar atelectasis. Right hilar prominence and increasing opacity towards the right lung base have developed. An inflammatory process is suspected. Electronically Signed   By: Marybelle Killings M.D.   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 And M2 segs with x1 pass with solitaire 75mm x 40 mm FR  retrieval device achieving TICI 3 repetfusion.             CULTURES: None  ANTIBIOTICS: None  SIGNIFICANT EVENTS: S/p medical embolectomy.  LINES/TUBES: Left femoral sheath 11/26, removed 11/26 or 27 Left radial artery 11/26, removed 11/28  DISCUSSION: 56 y.o. Female with presentation of L MCA stroke, s/p tPA and medical embolectomy.  ASSESSMENT / PLAN:  PULMONARY A: Acute respiratory failure in the setting of EVR/ CVA; resolved P:   SLP after extubation  Duoneb prn   CARDIOVASCULAR A:  CVA - neg troponin, EKG nonacute - lactic acid 0.65 P:  Tele monitoring Assess TTE Lipid panel per stroke workup  Currently does not require antihypertensive therapy  RENAL A:   No acute issues P:   Magnesium and phosphorus are within normal limits. Replace electrolytes as  indicated Avoid nephrotoxic agents, ensure adequate renal perfusion  GASTROINTESTINAL A:   NPO - normal LFTs P:   Stop PPI after enteral feeding has begun Bowel regimen for PAD protocol  HEMATOLOGIC A:   Leukocytosis- mild, likely reactive Elevated H/H P:  Trend CBC No anticoagulation x 24 hr s/p TPA HIV ordered   INFECTIOUS A:   Leukocytosis- likely due to stroke P:   Chest xray with possible LLL atelectasis. U/A is normal Leukocytosis improving.   ENDOCRINE A:   No acute issues P:    d/cCBG q 4hr   NEUROLOGIC A:   Acute L MCA and L ICA occulusion s/p TPA and EVR with complete revascularization s/p mechanical thrombectomy  - presented with aphasia, right sided weakness and left gaze P:   Speech therapy evaluation for swallowing today  UDS pending SLP/ PT/ OT consults when appropriate  Daily aspirin Statin at discharge.  Patient started on Lipitor 80 mg daily   FAMILY  - Updates: Husband at the bedside.  - Inter-disciplinary family meet or Palliative Care meeting due by: 08/28/2017   Pulmonary and [place] Pager: [phone]  08/24/2017, 9:08 AM

## 2017-08-24 NOTE — Progress Notes (Signed)
Chief Complaint: Patient was seen today for follow up CVA s/p intervention  Supervising Physician: Luanne Bras  Patient Status: [facility] - In-pt  Subjective: Significant (L)sided CVA s/p bilateral common carotid and RT vert arteriograms,followed by complete revascularization of occluded Lt ICA terminus ,Lt MCA M! And M2 segs with x1 pass with solitaire 64m x 40 mm FR retrieval device achieving TICI 3 repetfusion Pt now extubated, more awake and active today No movement of (R)side thus far. (L)groin sheath removed without issue   Objective: Physical Exam: BP 126/73   Pulse (!) 106   Temp 98.2 F (36.8 C) (Oral)   Resp 14   Ht 5' 2"  (1.575 m)   Wt 134 lb (60.8 kg)   LMP  (LMP Unknown)   SpO2 95%   BMI 24.51 kg/m  Awakens to voice, moving left UE and LE None on right (L)groin soft, NT, no hematoma. Feet warm, palpable pulses   Current Facility-Administered Medications:  .  [COMPLETED] alteplase (ACTIVASE) 1 mg/mL infusion 55 mg, 0.9 mg/kg, Intravenous, Once, Stopped at 08/21/17 2015 **FOLLOWED BY** 0.9 %  sodium chloride infusion, 50 mL, Intravenous, Once, Absalom, Nicholas, MD .  0.9 %  sodium chloride infusion, , Intravenous, Continuous, Deveshwar, Sanjeev, MD, Last Rate: 75 mL/hr at 08/24/17 1000 .  acetaminophen (TYLENOL) tablet 650 mg, 650 mg, Oral, Q4H PRN **OR** acetaminophen (TYLENOL) solution 650 mg, 650 mg, Per Tube, Q4H PRN **OR** acetaminophen (TYLENOL) suppository 650 mg, 650 mg, Rectal, Q4H PRN, KGreta Doom MD, 650 mg at 08/23/17 1759 .  aspirin suppository 300 mg, 300 mg, Rectal, Daily, SGarvin Fila MD, 300 mg at 08/24/17 1006 .  atorvastatin (LIPITOR) tablet 80 mg, 80 mg, Oral, q1800, Costello, Mary A, NP, 80 mg at 08/22/17 1815 .  bisacodyl (DULCOLAX) suppository 10 mg, 10 mg, Rectal, Daily PRN, SJennelle HumanB, NP .  chlorhexidine gluconate (MEDLINE KIT) (PERIDEX) 0.12 % solution 15 mL, 15 mL, Mouth Rinse, BID, Simpson, Paula B, NP, 15  mL at 08/24/17 0835 .  docusate (COLACE) 50 MG/5ML liquid 100 mg, 100 mg, Per Tube, BID PRN, SJennelle HumanB, NP .  fentaNYL (SUBLIMAZE) injection 25-100 mcg, 25-100 mcg, Intravenous, Q1H PRN, NJavier Glazier MD, 100 mcg at 08/23/17 0130 .  ipratropium-albuterol (DUONEB) 0.5-2.5 (3) MG/3ML nebulizer solution 3 mL, 3 mL, Nebulization, Q6H PRN, SJennelle HumanB, NP .  MEDLINE mouth rinse, 15 mL, Mouth Rinse, QID, Simpson, Paula B, NP, 15 mL at 08/24/17 0359 .  midazolam (VERSED) injection 1-4 mg, 1-4 mg, Intravenous, Q1H PRN, NJavier Glazier MD, 2 mg at 08/23/17 0646 .  nicardipine (CARDENE) 28min 0.86% saline 20065mV infusion (0.1 mg/ml), 0-15 mg/hr, Intravenous, Continuous, Deveshwar, Sanjeev, MD, Stopped at 08/23/17 1025 .  nicotine (NICODERM CQ - dosed in mg/24 hours) patch 14 mg, 14 mg, Transdermal, Daily PRN, Costello, Mary A, NP .  pantoprazole (PROTONIX) injection 40 mg, 40 mg, Intravenous, QHS, KirGreta DoomD, 40 mg at 08/23/17 2203 .  phenylephrine (NEO-SYNEPHRINE) 10 mg in sodium chloride 0.9 % 250 mL (0.04 mg/mL) infusion, 0-100 mcg/min, Intravenous, Titrated, Nestor, JenSonia BallerD, Stopped at 08/22/17 0156 .  potassium chloride 10 mEq in 100 mL IVPB, 10 mEq, Intravenous, Q1 Hr x 4, MilJesus GeneraD .  ticagrelor (BRVa [facility] Healthcare Systemablet 180 mg, 180 mg, Oral, Once, DevLuanne BrasD  Labs: CBC Recent Labs    08/23/17 0445 08/24/17 0556  WBC 15.7* 14.8*  HGB 12.8 12.1  HCT 39.6 38.1  PLT 361 304   BMET Recent Labs    08/23/17 0445 08/24/17 0556  NA 141 142  K 3.5 3.1*  CL 113* 112*  CO2 23 25  GLUCOSE 119* 86  BUN 12 9  CREATININE 0.48 0.44  CALCIUM 8.2* 8.4*   LFT Recent Labs    08/21/17 1829  PROT 8.3*  ALBUMIN 4.5  AST 32  ALT 27  ALKPHOS 73  BILITOT 0.4   PT/INR Recent Labs    08/21/17 1829  LABPROT 11.5  INR 0.85     Studies/Results: Mr Brain Wo Contrast  Result Date: 08/22/2017 CLINICAL DATA:  56 y/o  F; post tPA  stroke follow-up. EXAM: MRI HEAD WITHOUT CONTRAST TECHNIQUE: Multiplanar, multiecho pulse sequences of the brain and surrounding structures were obtained without intravenous contrast. COMPARISON:  08/21/2017 CT of head and CT angiogram of head. 08/21/2017 cerebral angiogram. FINDINGS: Brain: Primary region of diffusion involving left temporal operculum, left posterior insula, left posterior lentiform nucleus, extending into corona radiata and caudate body compatible with acute infarction. The infarct spans approximately 2.7 x 2.0 x 4.5 cm (volume = 13 cm^3). Small cortical foci of reduced diffusion are present in left parietal, left posterior temporal, and left frontal lobes. Additionally there are subcentimeter foci of reduced diffusion in left frontal centrum semiovale and the left occipital lobe. Background of mild chronic microvascular ischemic changes and parenchymal volume loss of the brain. Punctate foci of susceptibility are present within the bilateral parietal white matter compatible hemosiderin deposition of microhemorrhage. No mass effect, hydrocephalus, or extra-axial collection. Vascular: Normal central flow voids including left proximal MCA. Skull and upper cervical spine: Normal marrow signal. Sinuses/Orbits: Mild diffuse paranasal sinus mucosal thickening. Otherwise negative. Other: None. IMPRESSION: 1. Acute infarction involving left temporal operculum, left posterior insula, left posterior lentiform nucleus, and extending into corona radiata and caudate body, approximately 13 cc. Additional small foci of acute infarction are present in left posterior temporal, left frontal, left parietal, and left occipital lobes. 2. Biparietal white matter microhemorrhage, probably chronic. 3. Mild chronic microvascular ischemic changes and mild parenchymal volume loss of the brain. 4. Mild paranasal sinus disease. Electronically Signed   By: Kristine Garbe M.D.   On: 08/22/2017 17:59   Dg Chest Port  1 View  Result Date: 08/24/2017 CLINICAL DATA:  Pneumonia EXAM: PORTABLE CHEST 1 VIEW COMPARISON:  08/22/2017 FINDINGS: Endotracheal and NG tubes removed. Subsegmental atelectasis at the left base increased. Dense opacity projecting over the right hilum extending into the lower lobes has increased. No pneumothorax. Cardiomegaly. IMPRESSION: Extubated Increased left basilar atelectasis. Right hilar prominence and increasing opacity towards the right lung base have developed. An inflammatory process is suspected. Electronically Signed   By: Marybelle Killings M.D.   On: 08/24/2017 08:18    Assessment/Plan: (L)sided CVA S/P bilateral common carotid and RT vert arteriograms,followed by complete revascularization of occluded Lt ICA terminus ,Lt MCA M! And M2 segs with x1 pass with solitaire 56m x 40 mm FR  retrieval device achieving TICI 3 repetfusion No further recs from NIR     LOS: 3 days   I spent a total of 15 minutes in face to face in clinical consultation, greater than 50% of which was counseling/coordinating care for CVA  BAscencion DikePA-C 08/24/2017 11:00 AM

## 2017-08-24 NOTE — Progress Notes (Signed)
OT Cancellation Note  Patient Details Name: [name] MRN: [id number] DOB: 1961/03/20   Cancelled Treatment:    Reason Eval/Treat Not Completed: Patient not medically ready(strict bedrest orders)  Binnie Kand M.S., OTR/L Pager: [phone]  08/24/2017, 6:56 AM

## 2017-08-24 NOTE — Evaluation (Signed)
Clinical/Bedside Swallow Evaluation Patient Details  Name: DAMAYA KEIM MRN: [id number] Date of Birth: 1961-05-13  Today's Date: 08/24/2017 Time: SLP Start Time (ACUTE ONLY): 1325 SLP Stop Time (ACUTE ONLY): 1340 SLP Time Calculation (min) (ACUTE ONLY): 15 min  Past Medical History: History reviewed. No pertinent past medical history. Past Surgical History:  Past Surgical History:  Procedure Laterality Date  . IR ANGIO INTRA EXTRACRAN SEL COM CAROTID INNOMINATE UNI R MOD SED  08/21/2017  . IR ANGIO VERTEBRAL SEL SUBCLAVIAN INNOMINATE UNI R MOD SED  08/21/2017  . IR PERCUTANEOUS ART THROMBECTOMY/INFUSION INTRACRANIAL INC DIAG ANGIO  08/21/2017  . RADIOLOGY WITH ANESTHESIA N/A 08/21/2017   Procedure: RADIOLOGY WITH ANESTHESIA;  Surgeon: Julieanne Cotton, MD;  Location: [facility] OR;  Service: Radiology;  Laterality: N/A;   HPI:  56 year old right handed female with no significant PMH/ has not seen physican in 15 years who presented to [facility] with acute onset of aphasia, right sided weakness and left gaze 11/26. LSW at 1515. CT head negative for ICH however CTA showed left MCA and left ICA occlusion. TPA given at 1918. Transferred to [facility] for endovascular revascularization 1/26, Intubated for procedure. Extubated 11/28.    Assessment / Plan / Recommendation Clinical Impression  Patient presents with evidence of a neurologically based  dysphagia characterized by CN VII and XII dysfunction resulting in decreased oral control of bolus with intermittent anterior labial spillage. Pharyngeally, there is evidence of decreased airway protection with thin liquids (ice chips), improved with trials of pureed solids. Cannot r/o impact of 2 day intubation of swallowing function however disorders appear primarily neuro in nature. In light of severity of deficits, recommend proceeding with instrumental testing ot determine least restrictive diet. Will plan for MBS 11/30 am.  SLP Visit Diagnosis:  Dysphagia, oropharyngeal phase (R13.12)    Aspiration Risk       Diet Recommendation NPO   Medication Administration: Via alternative means    Other  Recommendations Oral Care Recommendations: Oral care QID   Follow up Recommendations Inpatient Rehab        Swallow Study   General HPI: 56 year old right handed female with no significant PMH/ has not seen physican in 15 years who presented to [facility] with acute onset of aphasia, right sided weakness and left gaze 11/26. LSW at 1515. CT head negative for ICH however CTA showed left MCA and left ICA occlusion. TPA given at 1918. Transferred to [facility] for endovascular revascularization 1/26, Intubated for procedure. Extubated 11/28.  Type of Study: Bedside Swallow Evaluation Previous Swallow Assessment: none Diet Prior to this Study: NPO Temperature Spikes Noted: No Respiratory Status: Nasal cannula History of Recent Intubation: Yes Length of Intubations (days): 2 days Date extubated: 08/23/17 Behavior/Cognition: Alert;Cooperative;Pleasant mood Oral Cavity Assessment: Within Functional Limits Oral Care Completed by SLP: Yes Oral Cavity - Dentition: Adequate natural dentition Vision: Functional for self-feeding Self-Feeding Abilities: Able to feed self Patient Positioning: Upright in chair Baseline Vocal Quality: Hoarse Volitional Cough: Weak Volitional Swallow: Able to elicit    Oral/Motor/Sensory Function Overall Oral Motor/Sensory Function: Moderate impairment Facial ROM: Reduced right;Suspected CN VII (facial) dysfunction Facial Symmetry: Abnormal symmetry right;Suspected CN VII (facial) dysfunction Facial Strength: Reduced right;Suspected CN VII (facial) dysfunction Lingual ROM: Reduced right;Suspected CN XII (hypoglossal) dysfunction Lingual Strength: Reduced;Suspected CN XII (hypoglossal) dysfunction Velum: Within Functional Limits Mandible: Within Functional Limits   Ice Chips Ice chips: Impaired Presentation:  Spoon Oral Phase Impairments: Reduced labial seal Pharyngeal Phase Impairments: Cough - Immediate  Thin Liquid Thin Liquid: Not tested    Nectar Thick Nectar Thick Liquid: Not tested   Honey Thick Honey Thick Liquid: Not tested   Puree Puree: Impaired Presentation: Spoon Oral Phase Functional Implications: Prolonged oral transit   Solid   GO   Mariah Harn MA, CCC-SLP [phone]  Solid: Not tested        Keanthony Poole Meryl 08/24/2017,2:01 PM

## 2017-08-24 NOTE — Consult Note (Signed)
Physical Medicine and Rehabilitation Consult Reason for Consult: Decreased functional mobility Referring Physician: Dr. Leonie Man   HPI: [name] is a 56 y.o. right handed female with unremarkable past medical history except tobacco abuse on no prescription medications. Per chart review patient lives with spouse. Independent prior to admission. Presented 08/21/2017 to [facility] hospital with right-sided weakness and slurred speech. CT of the head showed small acute infarct left insula, left putamen and possibly left parietal cortex. Hyperdense distal left ICA and M1 segment consistent with thrombus. CT angiogram head and neck showed positive emergent large vessel occlusion. Severe atheromatous stenosis of the left ICA bulb felt to be embolic source. Patient did receive TPA. Patient transferred to [facility] underwent revascularization per interventional radiology. Latest MRI shows acute infarct left temporal operculum, left posterior insula, left posterior lentiform nuclear medicine extending into the corona radiata and caudate body. Biparietal white matter microhemorrhage felt to be chronic. Echocardiogram with ejection fraction of 60% no wall motion abnormalities. Lower extremity Dopplers negative for DVT. Neurology follow-up and await plan for possible need for TEE and loop recorder. Presently on aspirin for CVA prophylaxis. Physical therapy evaluation completed 08/24/2017 with recommendations of physical medicine rehabilitation consult.  Patient has hypophonia but is able to speak in short phrases  Review of Systems  Constitutional: Negative for chills and fever.  HENT: Negative for hearing loss.   Eyes: Negative for blurred vision and double vision.  Respiratory: Positive for cough. Negative for shortness of breath.   Cardiovascular: Negative for chest pain, palpitations and leg swelling.  Gastrointestinal: Positive for constipation. Negative for nausea and vomiting.    Genitourinary: Negative for dysuria, flank pain and hematuria.  Musculoskeletal: Positive for myalgias.  Skin: Negative for rash.  Neurological: Positive for speech change and focal weakness.  All other systems reviewed and are negative.  History reviewed. No pertinent past medical history. Past Surgical History:  Procedure Laterality Date  . IR ANGIO INTRA EXTRACRAN SEL COM CAROTID INNOMINATE UNI R MOD SED  08/21/2017  . IR ANGIO VERTEBRAL SEL SUBCLAVIAN INNOMINATE UNI R MOD SED  08/21/2017  . IR PERCUTANEOUS ART THROMBECTOMY/INFUSION INTRACRANIAL INC DIAG ANGIO  08/21/2017  . RADIOLOGY WITH ANESTHESIA N/A 08/21/2017   Procedure: RADIOLOGY WITH ANESTHESIA;  Surgeon: Luanne Bras, MD;  Location: [place];  Service: Radiology;  Laterality: N/A;   History reviewed. No pertinent family history. Social History:  reports that she has been smoking.  she has never used smokeless tobacco. She reports that she does not use drugs. Her alcohol history is not on file. Allergies: No Known Allergies Medications Prior to Admission  Medication Sig Dispense Refill  . loratadine (CLARITIN) 10 MG tablet Take 10 mg by mouth daily.      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; 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 Stood x 2 and pivoted to left bed to chair. Mod assist to scoot to EOB and back in chair with reciprocal cueing Ambulation/Gait General Gait Details: unable    ADL: ADL Overall ADL's : Needs assistance/impaired Eating/Feeding: NPO Grooming: Moderate assistance, Sitting Upper Body Bathing: Moderate assistance, Sitting Lower Body Bathing: Maximal assistance, Sit to/from stand Upper Body Dressing : Moderate assistance, Sitting Lower Body Dressing: Maximal assistance, Sit to/from stand Lower Body Dressing Details (indicate cue type and reason): Pt able to assist with donning L sock and reaching behind to pull up panties Toileting- Clothing Manipulation and Hygiene: Maximal assistance Functional mobility during ADLs: +2 for physical assistance, Maximal assistance(able to step with LLE; total A to move RLE; knee buckling)  Cognition: Cognition Overall Cognitive Status: Impaired/Different from baseline Arousal/Alertness: Awake/alert Orientation Level: Oriented to person, Oriented to place(aphasia limiting assessment) Attention: Sustained Sustained Attention: Impaired Sustained Attention Impairment: Verbal basic Problem Solving: Appears intact(for basic self care task) Comments: to be assessed further, aphasia impacting assessment Cognition Arousal/Alertness: Awake/alert Behavior During Therapy: Flat affect, Impulsive Overall Cognitive Status: Impaired/Different from baseline Area of Impairment: Attention, Memory, Following  commands, Safety/judgement, Awareness, Problem solving, Orientation Orientation Level: Disoriented to, Place, Time, Situation Current Attention Level: Sustained Following Commands: Follows one step commands with increased time Safety/Judgement: Decreased awareness of safety, Decreased awareness of deficits Awareness: Emergent Problem Solving: Slow processing, Difficulty sequencing, Requires verbal cues, Requires tactile cues General Comments: Rt inattention unable to use comb/toothbrush  Blood pressure 138/89, pulse (!) 104, temperature 97.9 F (36.6 C), temperature source Oral, resp. rate 14, height 5\' 2"  (1.575 m), weight 60.8 kg (134 lb), SpO2 (!) 88 %. Physical Exam  Vitals reviewed. Constitutional: She appears well-developed.  HENT:  Right facial droop  Eyes:  Pupils reactive to light  Neck: Normal range of motion. Neck supple. No thyromegaly present.  Cardiovascular: Normal rate and regular rhythm.  Respiratory:  Limited inspiratory effort but clear to auscultation  GI: Soft. Bowel sounds are normal. She exhibits no distension.  Neurological:  Lethargic but arousable. Appears to have a bit of the field cut. Provides her name and age. Followed simple commands  Skin: Skin is warm and dry.  Right upper extremity 0/5 in the deltoid bicep tricep grip  Right lower extremity 0/5 in hip flexor knee extensor ankle dorsiflexor plantar flexor Left upper extremity 5/5 in the deltoid, bicep, tricep, grip Left lower extremity 5/5 in the hip flexor knee extensor ankle dorsiflexor Sensation reports intact to light touch, able to identify fingers and toes touch bilaterally. Visual fields are intact to confrontation testing Results for orders placed or performed during the hospital encounter of 08/21/17 (from the past 24 hour(s))  Glucose, capillary     Status: Abnormal   Collection Time: 08/23/17  4:23 PM  Result Value Ref Range   Glucose-Capillary 105 (H) 65 - 99 mg/dL   Comment 1 Notify  RN    Comment 2 Document in Chart   Glucose, capillary     Status: None   Collection Time: 08/23/17  7:45 PM  Result Value Ref Range   Glucose-Capillary 98 65 - 99 mg/dL  Glucose, capillary     Status: None   Collection Time:  08/23/17 11:35 PM  Result Value Ref Range   Glucose-Capillary 85 65 - 99 mg/dL  Glucose, capillary     Status: None   Collection Time: 08/24/17  3:22 AM  Result Value Ref Range   Glucose-Capillary 86 65 - 99 mg/dL  CBC with Differential/Platelet     Status: Abnormal   Collection Time: 08/24/17  5:56 AM  Result Value Ref Range   WBC 14.8 (H) 4.0 - 10.5 K/uL   RBC 3.90 3.87 - 5.11 MIL/uL   Hemoglobin 12.1 12.0 - 15.0 g/dL   HCT 38.1 36.0 - 46.0 %   MCV 97.7 78.0 - 100.0 fL   MCH 31.0 26.0 - 34.0 pg   MCHC 31.8 30.0 - 36.0 g/dL   RDW 14.2 11.5 - 15.5 %   Platelets 304 150 - 400 K/uL   Neutrophils Relative % 76 %   Neutro Abs 11.1 (H) 1.7 - 7.7 K/uL   Lymphocytes Relative 13 %   Lymphs Abs 1.9 0.7 - 4.0 K/uL   Monocytes Relative 10 %   Monocytes Absolute 1.5 (H) 0.1 - 1.0 K/uL   Eosinophils Relative 1 %   Eosinophils Absolute 0.2 0.0 - 0.7 K/uL   Basophils Relative 0 %   Basophils Absolute 0.0 0.0 - 0.1 K/uL  Basic metabolic panel     Status: Abnormal   Collection Time: 08/24/17  5:56 AM  Result Value Ref Range   Sodium 142 135 - 145 mmol/L   Potassium 3.1 (L) 3.5 - 5.1 mmol/L   Chloride 112 (H) 101 - 111 mmol/L   CO2 25 22 - 32 mmol/L   Glucose, Bld 86 65 - 99 mg/dL   BUN 9 6 - 20 mg/dL   Creatinine, Ser 0.44 0.44 - 1.00 mg/dL   Calcium 8.4 (L) 8.9 - 10.3 mg/dL   GFR calc non Af Amer >60 >60 mL/min   GFR calc Af Amer >60 >60 mL/min   Anion gap 5 5 - 15   Mr Brain Wo Contrast  Result Date: 08/22/2017 CLINICAL DATA:  56 y/o  F; post tPA stroke follow-up. EXAM: MRI HEAD WITHOUT CONTRAST TECHNIQUE: Multiplanar, multiecho pulse sequences of the brain and surrounding structures were obtained without intravenous contrast. COMPARISON:  08/21/2017 CT of  head and CT angiogram of head. 08/21/2017 cerebral angiogram. FINDINGS: Brain: Primary region of diffusion involving left temporal operculum, left posterior insula, left posterior lentiform nucleus, extending into corona radiata and caudate body compatible with acute infarction. The infarct spans approximately 2.7 x 2.0 x 4.5 cm (volume = 13 cm^3). Small cortical foci of reduced diffusion are present in left parietal, left posterior temporal, and left frontal lobes. Additionally there are subcentimeter foci of reduced diffusion in left frontal centrum semiovale and the left occipital lobe. Background of mild chronic microvascular ischemic changes and parenchymal volume loss of the brain. Punctate foci of susceptibility are present within the bilateral parietal white matter compatible hemosiderin deposition of microhemorrhage. No mass effect, hydrocephalus, or extra-axial collection. Vascular: Normal central flow voids including left proximal MCA. Skull and upper cervical spine: Normal marrow signal. Sinuses/Orbits: Mild diffuse paranasal sinus mucosal thickening. Otherwise negative. Other: None. IMPRESSION: 1. Acute infarction involving left temporal operculum, left posterior insula, left posterior lentiform nucleus, and extending into corona radiata and caudate body, approximately 13 cc. Additional small foci of acute infarction are present in left posterior temporal, left frontal, left parietal, and left occipital lobes. 2. Biparietal white matter microhemorrhage, probably chronic. 3. Mild chronic microvascular ischemic changes and  mild parenchymal volume loss of the brain. 4. Mild paranasal sinus disease. Electronically Signed   By: Kristine Garbe M.D.   On: 08/22/2017 17:59   Dg Chest Port 1 View  Result Date: 08/24/2017 CLINICAL DATA:  Pneumonia EXAM: PORTABLE CHEST 1 VIEW COMPARISON:  08/22/2017 FINDINGS: Endotracheal and NG tubes removed. Subsegmental atelectasis at the left base increased.  Dense opacity projecting over the right hilum extending into the lower lobes has increased. No pneumothorax. Cardiomegaly. IMPRESSION: Extubated Increased left basilar atelectasis. Right hilar prominence and increasing opacity towards the right lung base have developed. An inflammatory process is suspected. Electronically Signed   By: Marybelle Killings M.D.   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 Does the need for close, 24 hr/day medical supervision in concert with the patient's rehab needs make it unreasonable for this patient to be served in a less intensive setting? Yes 2. Co-Morbidities requiring supervision/potential complications: Dysphagia, n.p.o., aspiration risk, tachycardia 3. Due to bladder management, bowel management, safety, skin/wound care, disease management, medication administration, pain management and patient education, does the patient require 24 hr/day rehab nursing? Yes 4. Does the patient require coordinated care of a physician, rehab nurse, PT (1-2 hrs/day, 5 days/week), OT (1-2 hrs/day, 5 days/week) and SLP (.5-1 hrs/day, 5 days/week) to address physical and functional deficits in the context of the above medical diagnosis(es)? Yes Addressing deficits in the following areas: balance, endurance, locomotion, strength, transferring, bowel/bladder control, bathing, dressing, feeding, grooming, toileting, cognition, speech, language, swallowing and psychosocial support 5. Can the patient actively participate in an intensive therapy program of at least 3 hrs of therapy per day at least 5 days per week? Yes 6. The potential for patient to make measurable gains while on inpatient rehab is excellent 7. Anticipated functional outcomes upon discharge from inpatient rehab are supervision  with PT, supervision and min assist with OT, supervision and min assist  with SLP. 8. Estimated rehab length of stay to reach the above functional goals is: 17-22d 9. Anticipated D/C setting: Home 10. Anticipated post D/C treatments: [place] therapy 11. Overall Rehab/Functional Prognosis: excellent and good  RECOMMENDATIONS: This patient's condition is appropriate for continued rehabilitative care in the following setting: CIR Patient has agreed to participate in recommended program. Yes Note that insurance prior authorization may be required for reimbursement for recommended care.  Comment: Needs blood pressure and heart rate managed without IV medications prior to rehab  Charlett Blake M.D. [facility] FAAPM&R (Sports Med, Neuromuscular Med) Diplomate Am Board of Electrodiagnostic Med  Elizabeth Sauer 08/24/2017

## 2017-08-24 NOTE — Evaluation (Signed)
Physical Therapy Evaluation Patient Details Name: [name] MRN: [id number] DOB: [birth year] Today's Date: 08/24/2017   History of Present Illness  56 y.o. female with no significant past medical history who presented to [facility] where she was administered IV TPA.  CTA was performed which demonstrated left distal ICA/MCA occlusion. and underwent revascularization of occluded Lt ICA terminus ,Lt MCA. MRI + acute infarct involving L temporal, L posterior insula, L posterior lentiform ncleus and extending into corona radiata. Additional small intarcts in L frontal, parietal and occipital lobes.   Clinical Impression  Pt pleasant and willing to mobilize with decreased attention and awareness of deficits. Presents with right hemiplegia, right inattention, decreased cognition, decreased transfers, balance and function who will benefit from acute therapy to maximize mobility, function strength and safety to decrease burden of care.      Follow Up Recommendations CIR;Supervision/Assistance - 24 hour    Equipment Recommendations  Other (comment)(TBD)    Recommendations for Other Services       Precautions / Restrictions Precautions Precautions: Fall Restrictions Weight Bearing Restrictions: No      Mobility  Bed Mobility Overal bed mobility: Needs Assistance Bed Mobility: Supine to Sit     Supine to sit: Mod assist     General bed mobility comments: assist to move right side of her body but able to initiate transfer  Transfers Overall transfer level: Needs assistance   Transfers: Sit to/from Stand;Stand Pivot Transfers Sit to Stand: Max assist;+2 physical assistance Stand pivot transfers: Max assist;+2 physical assistance       General transfer comment: pt with right knee blocked throughout with physical assist for weight shifting, stepping and preventing RLE buckling. Stood x 2 and pivoted to left bed to chair. Mod assist to scoot to EOB and back in chair with  reciprocal cueing  Ambulation/Gait             General Gait Details: unable  Stairs            Wheelchair Mobility    Modified Rankin (Stroke Patients Only) Modified Rankin (Stroke Patients Only) Pre-Morbid Rankin Score: No symptoms Modified Rankin: Severe disability     Balance Overall balance assessment: Needs assistance   Sitting balance-Leahy Scale: Poor Sitting balance - Comments: pt with right posterior lean with inattention and unawareness of loss of balance even with cues     Standing balance-Leahy Scale: Zero Standing balance comment: (P) R knee buckling                             Pertinent Vitals/Pain Pain Assessment: No/denies pain Faces Pain Scale: No hurt    Home Living Family/patient expects to be discharged to:: Private residence Living Arrangements: Spouse/significant other Available Help at Discharge: Family;Available 24 hours/day Type of Home: House Home Access: Stairs to enter Entrance Stairs-Rails: None Entrance Stairs-Number of Steps: 3 Home Layout: One level Home Equipment: Walker - 2 wheels;Shower seat;Crutches      Prior Function Level of Independence: Independent         Comments: drove; 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; 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     Hand Dominance   Dominant Hand: Right    Extremity/Trunk Assessment   Upper Extremity Assessment Upper Extremity Assessment: Defer to OT evaluation RUE Deficits / Details: Flaccid; no movement or synergy pattern observed RUE Sensation: decreased light touch;decreased proprioception RUE Coordination: decreased fine motor;decreased gross motor(not using functionally at this  time)    Lower Extremity Assessment Lower Extremity Assessment: RLE deficits/detail RLE Deficits / Details: no ArOM, no withdrawal to pain, pt reports unable to sense pressure to extremity    Cervical / Trunk Assessment Cervical / Trunk Assessment: Other exceptions Cervical /  Trunk Exceptions: posterior and R bias; poor midline orientaiton - attention affects postural control  Communication   Communication: Expressive difficulties  Cognition Arousal/Alertness: Awake/alert Behavior During Therapy: Flat affect;Impulsive Overall Cognitive Status: Impaired/Different from baseline Area of Impairment: Attention;Memory;Following commands;Safety/judgement;Awareness;Problem solving;Orientation                 Orientation Level: Disoriented to;Place;Time;Situation Current Attention Level: Sustained   Following Commands: Follows one step commands with increased time Safety/Judgement: Decreased awareness of safety;Decreased awareness of deficits Awareness: Emergent Problem Solving: Slow processing;Difficulty sequencing;Requires verbal cues;Requires tactile cues General Comments: Rt inattention unable to use comb/toothbrush      General Comments      Exercises     Assessment/Plan    PT Assessment Patient needs continued PT services  PT Problem List Decreased strength;Decreased mobility;Decreased safety awareness;Decreased coordination;Decreased activity tolerance;Decreased cognition;Decreased balance;Decreased knowledge of use of DME;Impaired sensation       PT Treatment Interventions Gait training;Therapeutic exercise;Patient/family education;Balance training;Functional mobility training;Neuromuscular re-education;DME instruction;Therapeutic activities;Cognitive remediation    PT Goals (Current goals can be found in the Care Plan section)  Acute Rehab PT Goals Patient Stated Goal: return home PT Goal Formulation: With patient Time For Goal Achievement: 09/07/17 Potential to Achieve Goals: Good    Frequency Min 4X/week   Barriers to discharge Decreased caregiver support      Co-evaluation PT/OT/SLP Co-Evaluation/Treatment: Yes Reason for Co-Treatment: Complexity of the patient's impairments (multi-system involvement);For patient/therapist  safety         AM-PAC PT "6 Clicks" Daily Activity  Outcome Measure Difficulty turning over in bed (including adjusting bedclothes, sheets and blankets)?: Unable Difficulty moving from lying on back to sitting on the side of the bed? : Unable Difficulty sitting down on and standing up from a chair with arms (e.g., wheelchair, bedside commode, etc,.)?: Unable Help needed moving to and from a bed to chair (including a wheelchair)?: A Lot Help needed walking in hospital room?: Total Help needed climbing 3-5 steps with a railing? : Total 6 Click Score: 7    End of Session Equipment Utilized During Treatment: Gait belt Activity Tolerance: Patient tolerated treatment well Patient left: in chair;with call bell/phone within reach;with chair alarm set Nurse Communication: Mobility status;Precautions;Other (comment)(sequence for transfer) PT Visit Diagnosis: Other abnormalities of gait and mobility (R26.89);Hemiplegia and hemiparesis Hemiplegia - Right/Left: Right Hemiplegia - dominant/non-dominant: Dominant Hemiplegia - caused by: Cerebral infarction    Time: 1210-1226 PT Time Calculation (min) (ACUTE ONLY): 16 min   Charges:   PT Evaluation $PT Eval Moderate Complexity: 1 Mod     PT G Codes:        Delaney Meigs, PT [phone]   Lowana Hable B Areatha Kalata 08/24/2017, 2:51 PM
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
## 2017-08-24 NOTE — Progress Notes (Signed)
Inpatient Rehabilitation  Per PT and SLP request, patient was screened by Jari Dipasquale for appropriateness for an Inpatient Acute Rehab consult.  At this time we are recommending an Inpatient Rehab consult.  Please order if you are agreeable.    Machele Deihl, M.A., CCC/SLP Admission Coordinator  [facility]  Cell [phone]  

## 2017-08-24 NOTE — Progress Notes (Signed)
PT Cancellation Note  Patient Details Name: [name] MRN: [id number] DOB: 07/29/1961   Cancelled Treatment:    Reason Eval/Treat Not Completed: Medical issues which prohibited therapy(pt remains on strict bedrest and await increased activity order)   Dav[place] Fanara B Mahin Guardia 08/24/2017, 7:14 AM  Elwyn Reach, [place]
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
## 2017-08-25 ENCOUNTER — Inpatient Hospital Stay (HOSPITAL_COMMUNITY): Payer: Self-pay

## 2017-08-25 ENCOUNTER — Encounter (HOSPITAL_COMMUNITY): Payer: Self-pay | Admitting: Cardiology

## 2017-08-25 LAB — BASIC METABOLIC PANEL
Anion gap: 7 (ref 5–15)
BUN: 11 mg/dL (ref 6–20)
CO2: 26 mmol/L (ref 22–32)
Calcium: 9.1 mg/dL (ref 8.9–10.3)
Chloride: 108 mmol/L (ref 101–111)
Creatinine, Ser: 0.4 mg/dL — ABNORMAL LOW (ref 0.44–1.00)
GFR calc Af Amer: >60 mL/min (ref >60)
GFR calc non Af Amer: >60 mL/min (ref >60)
Glucose, Bld: 103 mg/dL — ABNORMAL HIGH (ref 65–99)
Potassium: 3.7 mmol/L (ref 3.5–5.1)
Sodium: 141 mmol/L (ref 135–145)

## 2017-08-25 LAB — GLUCOSE, CAPILLARY
GLUCOSE-CAPILLARY: 75 mg/dL (ref 65–99)
GLUCOSE-CAPILLARY: 78 mg/dL (ref 65–99)
GLUCOSE-CAPILLARY: 92 mg/dL (ref 65–99)
Glucose-Capillary: 99 mg/dL (ref 65–99)

## 2017-08-25 MED ORDER — ASPIRIN 325 MG PO TABS
325.0000 mg | ORAL_TABLET | Freq: Every day | ORAL | Status: DC
  Administered 2017-08-25 – 2017-08-28 (×4): 325 mg via ORAL
  Filled 2017-08-25 (×4): qty 1

## 2017-08-25 MED ORDER — LORATADINE 10 MG PO TABS
10.0000 mg | ORAL_TABLET | Freq: Every day | ORAL | Status: DC
  Administered 2017-08-25 – 2017-08-28 (×4): 10 mg via ORAL
  Filled 2017-08-25 (×4): qty 1

## 2017-08-25 MED ORDER — SODIUM CHLORIDE 0.9 % IV SOLN: INTRAVENOUS | Status: DC

## 2017-08-25 MED ORDER — METOPROLOL TARTRATE 12.5 MG HALF TABLET
12.5000 mg | ORAL_TABLET | Freq: Two times a day (BID) | ORAL | Status: DC
  Administered 2017-08-25 – 2017-08-28 (×7): 12.5 mg via ORAL
  Filled 2017-08-25 (×8): qty 1

## 2017-08-25 NOTE — Progress Notes (Signed)
PULMONARY / CRITICAL CARE MEDICINE   Name: [name] MRN: [id number] DOB: 01/18/1961    ADMISSION DATE:  08/21/2017   REFERRING MD:  Dr. Leonel Ramsay    HISTORY OF PRESENT ILLNESS:   56 year old right handed female with no significant PMH/ has not seen physican in 15 years who presented to [facility] with acute onset of aphasia, right sided weakness and left gaze 11/26.  LSW at 1515.  CT head negative for ICH however CTA showed left MCA and left ICA occlusion.  TPA given at [place]. Transferred to [facility] for endovascular revascularization.  Intubated for procedure.  CCM consulted for ventilator management.     PAST MEDICAL HISTORY :  She  has no past medical history on file.  PAST SURGICAL HISTORY: She  has a past surgical history that includes Radiology with anesthesia (N/A, 08/21/2017); IR PERCUTANEOUS ART THROMBECTOMY/INFUSION INTRACRANIAL INC DIAG ANGIO (08/21/2017); IR ANGIO INTRA EXTRACRAN SEL COM CAROTID INNOMINATE UNI R MOD SED (08/21/2017); and IR ANGIO VERTEBRAL SEL SUBCLAVIAN INNOMINATE UNI R MOD SED (08/21/2017).  No Known Allergies  No current facility-administered medications on file prior to encounter.    Current Outpatient Medications on File Prior to Encounter  Medication Sig  . loratadine (CLARITIN) 10 MG tablet Take 10 mg by mouth daily.    SUBJECTIVE:  Patient successfully extubated yesterday.  She is sitting upright in the chair.  She is awake alert and following commands and attempting to speak.  VITAL SIGNS: BP (!) 150/76   Pulse (!) 101   Temp 98.7 F (37.1 C) (Oral)   Resp (!) 26   Ht 5\' 2"  (1.575 m)   Wt 60.8 kg (134 lb)   LMP  (LMP Unknown)   SpO2 93%   BMI 24.51 kg/m   HEMODYNAMICS:    VENTILATOR SETTINGS:    INTAKE / OUTPUT: I/O last 3 completed shifts: In: 3150 [I.V.:2700; Other:50; IV Piggyback:400] Out: 5050 [Urine:5050]  PHYSICAL EXAMINATION: General: Patient is awake.  No distress observed. Neuro: Right facial droop with  grimace.  Right upper extremity without motor function.  Right lower extremity withdrawal to tactile stimulation.  Left upper and lower extremity motor function approximately 4/5.   HEENT:  PERL.  No stridor. No jvd or icterus. Cardiovascular:  Normal s1s2, regular rhythm. No murmur or gallop.  Lungs:  Bilateral breath sounds. Clear to auscultation. No wheezes, crackles or rhonchi. Abdomen:  Flat. No muscle guarding. No organomegaly. Musculoskeletal:  +2 pulses, right radial, bilateral DP's. Left femoral arterial site bandaged. No active bleeding.  Skin: Warm without mottling.  Bruise noted near the left femoral catheter sheath site is unchanged  lABS:  BMET Recent Labs  Lab 08/22/17 0811 08/23/17 0445 08/24/17 0556  NA 137 141 142  K 3.8 3.5 3.1*  CL 107 113* 112*  CO2 23 23 25   BUN 13 12 9   CREATININE 0.58 0.48 0.44  GLUCOSE 148* 119* 86    Electrolytes Recent Labs  Lab 08/22/17 0811 08/22/17 1016 08/23/17 0445 08/24/17 0556  CALCIUM 8.3*  --  8.2* 8.4*  MG  --  1.9  --   --   PHOS  --  3.7  --   --     CBC Recent Labs  Lab 08/22/17 0811 08/23/17 0445 08/24/17 0556  WBC 17.1* 15.7* 14.8*  HGB 14.3 12.8 12.1  HCT 42.9 39.6 38.1  PLT 368 361 304    Coag's Recent Labs  Lab 08/21/17 1829  APTT 41*  INR 0.85  Sepsis Markers Recent Labs  Lab 08/21/17 1839  LATICACIDVEN 0.65    ABG Recent Labs  Lab 08/21/17 0008 08/22/17 0850  PHART 7.283* 7.344*  PCO2ART 49.0* 42.0  PO2ART 97.9 82.6*    Liver Enzymes Recent Labs  Lab 08/21/17 1829  AST 32  ALT 27  ALKPHOS 73  BILITOT 0.4  ALBUMIN 4.5    Cardiac Enzymes Recent Labs  Lab 08/22/17 1016  TROPONINI 0.03*    Glucose Recent Labs  Lab 08/23/17 2335 08/24/17 0322 08/24/17 1930 08/24/17 2341 08/25/17 0315 08/25/17 0821  GLUCAP 85 86 71 73 75 78    Imaging Dg Swallowing Func-speech Pathology  Result Date: 08/25/2017 Objective Swallowing Evaluation: Type of Study:  MBS-Modified Barium Swallow Study  Patient Details Name: [name] MRN: [id number] Date of Birth: Sep 24, 1961 Today's Date: 08/25/2017 Time: SLP Start Time (ACUTE ONLY): 0845 -SLP Stop Time (ACUTE ONLY): 0908 SLP Time Calculation (min) (ACUTE ONLY): 23 min Past Medical History: No past medical history on file. Past Surgical History: Past Surgical History: Procedure Laterality Date . IR ANGIO INTRA EXTRACRAN SEL COM CAROTID INNOMINATE UNI R MOD SED  08/21/2017 . IR ANGIO VERTEBRAL SEL SUBCLAVIAN INNOMINATE UNI R MOD SED  08/21/2017 . IR PERCUTANEOUS ART THROMBECTOMY/INFUSION INTRACRANIAL INC DIAG ANGIO  08/21/2017 . RADIOLOGY WITH ANESTHESIA N/A 08/21/2017  Procedure: RADIOLOGY WITH ANESTHESIA;  Surgeon: Luanne Bras, MD;  Location: [facility];  Service: Radiology;  Laterality: N/A; HPI: 56 year old right handed female with no significant PMH/ has not seen physican in 15 years who presented to [facility] with acute onset of aphasia, right sided weakness and left gaze 11/26. LSW at 1515. CT head negative for ICH however CTA showed left MCA and left ICA occlusion. TPA given at [place]. Transferred to [facility] for endovascular revascularization 1/26, Intubated for procedure. Extubated 11/28.  Subjective: pt awake in chair Assessment / Plan / Recommendation CHL IP CLINICAL IMPRESSIONS 08/25/2017 Clinical Impression Pt presents with moderate oral and mild pharyngeal dysphagia mostly characterized by decreased oral transiting coordination resulting premature spillage of boluses into pharynx.  Pharyngeal swallow is strong but timing of laryngeal closure slightly impaired allowing trace aspiration of thin via tsp *post trach* and penetration of thin via straw.  Chin tuck posture prevents penetration.  Pt does take large boluses and will require full supervision to maximize airway protection.  Educated pt live to results and provided written strategies/precautions to mitigate aspiration risk.  Will follow for language and  dysphagia treatment.  SLP Visit Diagnosis Dysphagia, oropharyngeal phase (R13.12) Attention and concentration deficit following -- Frontal lobe and executive function deficit following -- Impact on safety and function Mild aspiration risk   CHL IP TREATMENT RECOMMENDATION 08/25/2017 Treatment Recommendations Therapy as outlined in treatment plan below   Prognosis 08/25/2017 Prognosis for Safe Diet Advancement Good Barriers to Reach Goals -- Barriers/Prognosis Comment -- CHL IP DIET RECOMMENDATION 08/25/2017 SLP Diet Recommendations Dysphagia 3 (Mech soft) solids;Thin liquid Liquid Administration via Straw Medication Administration Whole meds with puree Compensations Minimize environmental distractions;Slow rate;Small sips/bites;Chin tuck;Other (Comment);Lingual sweep for clearance of pocketing Postural Changes Remain semi-upright after after feeds/meals (Comment);Seated upright at 90 degrees   CHL IP OTHER RECOMMENDATIONS 08/25/2017 Recommended Consults -- Oral Care Recommendations Oral care before and after PO Other Recommendations --   CHL IP FOLLOW UP RECOMMENDATIONS 08/24/2017 Follow up Recommendations Inpatient Rehab   CHL IP FREQUENCY AND DURATION 08/25/2017 Speech Therapy Frequency (ACUTE ONLY) min 2x/week Treatment Duration 2 weeks      CHL IP  ORAL PHASE 08/25/2017 Oral Phase Impaired Oral - Pudding Teaspoon -- Oral - Pudding Cup -- Oral - Honey Teaspoon -- Oral - Honey Cup -- Oral - Nectar Teaspoon -- Oral - Nectar Cup Decreased bolus cohesion;Premature spillage;Weak lingual manipulation Oral - Nectar Straw Premature spillage;Decreased bolus cohesion;Weak lingual manipulation Oral - Thin Teaspoon Decreased bolus cohesion;Premature spillage;Weak lingual manipulation Oral - Thin Cup Premature spillage;Decreased bolus cohesion;Weak lingual manipulation Oral - Thin Straw Decreased bolus cohesion;Premature spillage;Weak lingual manipulation Oral - Puree Decreased bolus cohesion;Premature spillage;Weak lingual  manipulation Oral - Mech Soft Decreased bolus cohesion;Premature spillage;Impaired mastication;Weak lingual manipulation Oral - Regular -- Oral - Multi-Consistency -- Oral - Pill Decreased bolus cohesion;Weak lingual manipulation;Premature spillage Oral Phase - Comment --  CHL IP PHARYNGEAL PHASE 08/25/2017 Pharyngeal Phase Impaired Pharyngeal- Pudding Teaspoon -- Pharyngeal -- Pharyngeal- Pudding Cup -- Pharyngeal -- Pharyngeal- Honey Teaspoon -- Pharyngeal -- Pharyngeal- Honey Cup -- Pharyngeal -- Pharyngeal- Nectar Teaspoon WFL Pharyngeal -- Pharyngeal- Nectar Cup WFL Pharyngeal -- Pharyngeal- Nectar Straw WFL Pharyngeal -- Pharyngeal- Thin Teaspoon Penetration/Aspiration during swallow;Other (Comment) Pharyngeal Material enters airway, passes BELOW cords without attempt by patient to eject out (silent aspiration) Pharyngeal- Thin Cup -- Pharyngeal -- Pharyngeal- Thin Straw Penetration/Aspiration during swallow;Other (Comment) Pharyngeal Material enters airway, remains ABOVE vocal cords and not ejected out Pharyngeal- Puree WFL;Delayed swallow initiation-vallecula Pharyngeal -- Pharyngeal- Mechanical Soft WFL;Delayed swallow initiation-vallecula Pharyngeal -- Pharyngeal- Regular -- Pharyngeal -- Pharyngeal- Multi-consistency -- Pharyngeal -- Pharyngeal- Pill WFL;Delayed swallow initiation-vallecula Pharyngeal -- Pharyngeal Comment --  CHL IP CERVICAL ESOPHAGEAL PHASE 08/25/2017 Cervical Esophageal Phase WFL Pudding Teaspoon -- Pudding Cup -- Honey Teaspoon -- Honey Cup -- Nectar Teaspoon -- Nectar Cup -- Nectar Straw -- Thin Teaspoon -- Thin Cup -- Thin Straw -- Puree -- Mechanical Soft -- Regular -- Multi-consistency -- Pill -- Cervical Esophageal Comment -- No flowsheet data found. 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 And M2 segs with x1 pass with solitaire 11mm x 40 mm FR  retrieval device achieving TICI 3 repetfusion.             CULTURES: None  ANTIBIOTICS: None  SIGNIFICANT EVENTS: S/p medical embolectomy.  LINES/TUBES: Left femoral sheath 11/26, removed 11/26 or 27 Left radial artery 11/26, removed 11/28  DISCUSSION: 56 y.o. Female with presentation of L MCA stroke, s/p tPA and medical embolectomy.  ASSESSMENT / PLAN:  PULMONARY A: Acute respiratory failure in the setting of EVR/ CVA; resolved P:   Supportive care.  CARDIOVASCULAR A:  CVA - neg troponin, EKG nonacute - lactic acid 0.65 P:  Tele monitoring Assess TTE Lipid panel per stroke workup  Currently does not require antihypertensive therapy  RENAL A:   No acute issues P:   Magnesium and phosphorus are within normal limits. Replace electrolytes as indicated. F/u bmp today.   GASTROINTESTINAL A:   NPO - normal LFTs P:   Able to  eat po. D/c ppi.  HEMATOLOGIC A:   Leukocytosis- mild, likely reactive Elevated H/H P:  Trend CBC HIV negative.  INFECTIOUS A:   Leukocytosis- likely due to stroke P:   Chest xray with possible LLL atelectasis. U/A is normal Leukocytosis improving. No new findings.   ENDOCRINE A:   No acute issues P:      NEUROLOGIC A:   Acute L MCA and L ICA occulusion s/p TPA and EVR with complete revascularization s/p mechanical thrombectomy  - presented with aphasia, right sided weakness and left gaze P:   Modified diet based on MBS results. UDS pending SLP/ PT/ OT consults when appropriate  Daily aspirin Statin at discharge.   Patient started on Lipitor 80 mg daily   FAMILY  - Updates: Husband at the bedside.  - Inter-disciplinary family meet or Palliative Care meeting due by: 08/28/2017  Will see intermittently if requested. Thank you.   Pulmonary and [place] Pager: [phone]  08/25/2017, 10:51 AM

## 2017-08-25 NOTE — Progress Notes (Signed)
Rehab admissions - I met with patient and her husband.  They would like inpatient rehab admission.  Noted patient needs TEE and Loop recorder placed.  I will try to admit to inpatient rehab after TEE is completed, hopefully on Monday.  Call me for questions.  #[phone]

## 2017-08-25 NOTE — Progress Notes (Signed)
    [facility] has been requested to perform a transesophageal echocardiogram on [employer] for stroke.  After careful review of history and examination, the risks and benefits of transesophageal echocardiogram have been explained including risks of esophageal damage, perforation (1:10,000 risk), bleeding, pharyngeal hematoma as well as other potential complications associated with conscious sedation including aspiration, arrhythmia, respiratory failure and death. Alternatives to treatment were discussed, questions were answered. Patient is willing to proceed. Son and Mother were in room as well.  Cecilie Kicks, NP  08/25/2017 2:53 PM

## 2017-08-25 NOTE — Progress Notes (Signed)
PT Cancellation Note  Patient Details Name: [name] MRN: [id number] DOB: 10/07/1960   Cancelled Treatment:    Reason Eval/Treat Not Completed: Patient at procedure or test/unavailable(MBSS)   Reyce Lubeck B Ollen Rao 08/25/2017, 8:45 AM Elwyn Reach, Page

## 2017-08-25 NOTE — Progress Notes (Signed)
Pt admitted to 3W12.  Alert and oriented x 2-3.  Rt side flaccid with neglect on the right.  Pt able to tell me her name, where she is, and her birth month.  She is able to tell me the names of family in the room.  Telemetry placed on patient and CCMD called and verified. Bed alarm set and call bell within reach.

## 2017-08-25 NOTE — Progress Notes (Signed)
Modified Barium Swallow Progress Note  Patient Details  Name: TANYLA STEGE MRN: [id number] Date of Birth: 1960-11-13  Today's Date: 08/25/2017  Modified Barium Swallow completed.  Full report located under Chart Review in the Imaging Section.  Brief recommendations include the following:  Clinical Impression  Pt presents with moderate oral and mild pharyngeal dysphagia mostly characterized by decreased oral transiting coordination resulting premature spillage of boluses into pharynx.  Pharyngeal swallow is strong but timing of laryngeal closure slightly impaired allowing trace aspiration of thin via tsp *post trach* and penetration of thin via straw.  Chin tuck posture prevents penetration.  Pt does take large boluses and will require full supervision to maximize airway protection.  Educated pt live to results and provided written strategies/precautions to mitigate aspiration risk.  Will follow for language and dysphagia treatment.    Swallow Evaluation Recommendations       SLP Diet Recommendations: Dysphagia 3 (Mech soft) solids;Thin liquid   Liquid Administration via: Straw(must chin tuck)   Medication Administration: Whole meds with puree   Supervision: Full assist for feeding;Patient able to self feed   Compensations: Minimize environmental distractions;Slow rate;Small sips/bites;Chin tuck;Other (Comment);Lingual sweep for clearance of pocketing   Postural Changes: Remain semi-upright after after feeds/meals (Comment);Seated upright at 90 degrees   Oral Care Recommendations: Oral care before and after PO(due to oral deficits)        Claudie Fisherman, [facility] SLP [phone]

## 2017-08-25 NOTE — Progress Notes (Signed)
STROKE TEAM PROGRESS NOTE   SUBJECTIVE (INTERVAL HISTORY)   Her RN and therapist  are at the bedside. Patient is sitting up in bed and  remains aphasic with dense right hemiplegia. She speaks a few words, short sentences and follow midline commands No new events reported overnight per nursing.  OBJECTIVE Lab Results: CBC:  Recent Labs  Lab 08/22/17 0811 08/23/17 0445 08/24/17 0556  WBC 17.1* 15.7* 14.8*  HGB 14.3 12.8 12.1  HCT 42.9 39.6 38.1  MCV 95.5 96.8 97.7  PLT 368 361 304   BMP: Recent Labs  Lab 08/21/17 1829 08/21/17 1843 08/22/17 0811 08/22/17 1016 08/23/17 0445 08/24/17 0556 08/25/17 1058  NA 133* 140 137  --  141 142 141  K 3.6 3.7 3.8  --  3.5 3.1* 3.7  CL 101 102 107  --  113* 112* 108  CO2 26  --  23  --  23 25 26   GLUCOSE 96 94 148*  --  119* 86 103*  BUN 17 18 13   --  12 9 11   CREATININE 0.69 0.70 0.58  --  0.48 0.44 0.40*  CALCIUM 9.6  --  8.3*  --  8.2* 8.4* 9.1  MG  --   --   --  1.9  --   --   --   PHOS  --   --   --  3.7  --   --   --    Liver Function Tests:  Recent Labs  Lab 08/21/17 1829  AST 32  ALT 27  ALKPHOS 73  BILITOT 0.4  PROT 8.3*  ALBUMIN 4.5   Cardiac Enzymes:  Recent Labs  Lab 08/22/17 1016  TROPONINI 0.03*   Coagulation Studies:  No results for input(s): APTT, INR in the last 72 hours. Alcohol Level:  Recent Labs  Lab 08/21/17 [place] <10    PHYSICAL EXAM Temp:  [98 F (36.7 C)-99.3 F (37.4 C)] 98.7 F (37.1 C) (11/30 1200) Pulse Rate:  [86-114] 86 (11/30 1400) Resp:  [12-27] 14 (11/30 1400) BP: (136-154)/(58-111) 143/78 (11/30 1400) SpO2:  [86 %-97 %] 95 % (11/30 1400) General - obese middle-age Caucasian lady who is   in no apparent distress but has resting tachycardia Respiratory - Lungs clear bilaterally. No wheezing. Cardiovascular - Regular rate and rhythm   Neuro: Mental Status: Patient is awake aphasic,speaks a few words, short sentences only and will follow simple midline  commands Cranial Nerves: II: Does not blink to threat from the right.  Pupils are equal, round, and reactive to light.   III,IV, VI: Left gaze preference V: Facial sensation appears symmetric  VII: Facial movement is weak on the right Motor: She has a flaccid right hemiparesis, with no movement in the right arm in flexion to noxious stimuli in the right leg.  She moves her left side voluntarily with full strength. Sensory: She does flinch to noxious stimuli, but this is reduced on the right side. Cerebellar: She does not perform Gait deferred  IMAGING: I have personally reviewed the radiological images below and agree with the radiology interpretations.  Ct Angio Head & Neck W Or Wo Contrast Result Date: 08/21/2017 IMPRESSION: 1. Positive for emergent large vessel occlusion. No flow seen in the left M1 and distal intracranial LICA. There is flow seen in the left M2/3 branches suggesting favorable collaterals. Aspects scored on prior noncontrast head CT. 2. Severe atheromatous stenosis at the left ICA bulb which may be an embolic source. There is  comparatively poor flow between the stenosis and intracranial occlusion. 3. ~ 40% right ICA bulb atheromatous narrowing. 4. 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 There is good flow in the distal left V4 segment and left PICA, which may be retrograde. 5. Advanced atherosclerosis of the aortic arch with irregular plaque/thrombus at the isthmus. Motion affects visualization of the great vessel ostia.   Ct Head Wo Contrast Result Date: 08/21/2017 IMPRESSION: Normal head CT. No intraparenchymal hemorrhage, contrast staining or cytotoxic edema.   Portable Chest Xray Result Date: 08/22/2017 IMPRESSION: 1.  Stable lines and tubes. 2. Stable ventilation with mild left lung base atelectasis.   Portable Chest Xray Result Date: 08/21/2017  IMPRESSION: Endotracheal tube tip about 2.9 cm superior to carina. Patchy  atelectasis versus minimal infiltrate at the left base.  Ct Head Code Stroke Wo Contrast Result Date: 08/21/2017 IMPRESSION: Hyperdense distal left ICA and M1 segment consistent with thrombus in this setting. Small acute infarcts seen in the left insula, left putamen, and possibly left parietal cortex ASPECTS is 7 to 8.   Echocardiogram: not done                                    PENDING B/L LE Duplex:                                                       Negative for DVT   MRI Brain:                                                               Acute infarction involving left temporal operculum, left posterior insula, left posterior lentiform nucleus, and extending into corona radiata and caudate body, approximately 13 cc. Additional small foci of acute infarction are present in left posterior temporal, left frontal, left parietal, and left occipital lobes.   _____________________________________________________________________ ASSESSMENT: Ms. [name] is a 56 y.o. female with PMH of no significant past medical history (has not been to a physician in 15 years) who presents with right-sided weakness that started sometime between 3:15 and 5:15 PM. Her husband left her around 3:15 PM and then her brother found her on the floor at 5:15 PM.  She was taken to [facility] where she was administered IV TPA.  CTA was performed which demonstrated left distal ICA/MCA occlusion.  She was within the 6-hour window for IR intervention, and was taken directly to the Cath Lab.S/P bilateral common carotid and RT vert arteriograms,followed by complete revascularization of occluded Lt ICA terminus ,Lt MCA M! And M2 segs with x1 pass with solitaire 71mm x 40 mm FR retrieval device achieving TICI 3 reperfusion by Dr Estanislado Pandy.  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  Suspected Etiology: Likely cardioembolic source   Resultant Symptoms: aphasia, right sided weakness and left gaze preference Stroke Risk Factors: hyperlipidemia, hypertension and no medical care for greater than 15 years Other Stroke Risk Factors: Advanced age, Cigarette smoker,  Outstanding Stroke Work-up Studies: Echocardiogram:                                    Left ventricle: The cavity size was normal. Wall thickness was   increased in a pattern of mild LVH. Systolic function was normal.   The estimated ejection fraction was in the range of 55% to 60%.   Wall motion was normal; there were no regional wall motion   abnormalities. The study is not technically sufficient to allow   evaluation of LV diastolic function B/L LE Duplex:     No DVT                                                      PLAN  08/25/2017:  Start ASA  Started Atorvastatin 80 mg  Started oral diet and will start metoprolol 12.5 twice daily for resting tachycardia  Continue PT/OT/SLP   Transfer to neurology floor bed today.  TEE and loop recorder on Monday.  Transfer to inpatient rehabilitation after TEE/loop  Leukocytosis Remains afebrile U/A pending CXR stable with mild left lung base atelectasis.  Repeat labs in AM Management per CCM - Appreciate management  R/O AFIB: May need Outpatient TEE and Loop Recorder Placement prior to discharge Will continue to monitor closely   DYSPHAGIA: NPO until passes SLP swallow evaluation, once extubated  HYPERTENSION EMERGENCY: Stable, remains sedated/intubated on mechanical ventiation S/P Thrombectomy - SBP Parameters per IR Nicardipine drip and Labetolol PRN in progress Long term BP goal normotensive. Home Meds: NONE  HYPERLIPIDEMIA:    Component Value Date/Time   CHOL 237 (H) 08/22/2017 0811   TRIG 295 (H) 08/22/2017 0811   TRIG 301 (H) 08/22/2017 0811   HDL 35 (L) 08/22/2017 0811   CHOLHDL 6.8 08/22/2017 0811   VLDL 59 (H) 08/22/2017 0811   LDLCALC 143 (H) 08/22/2017 0811  Home Meds:  NONE LDL   goal < 70 Started on Lipitor to 80 mg daily Continue statin at discharge  R/O DIABETES: Lab Results  Component Value Date   HGBA1C 5.5 08/22/2017  HgbA1c goal < 7.0   TOBACCO ABUSE Current smoker Smoking cessation counseling will be provided, once extubated Nicotine patch provided PRN  Other Active Problems: Active Problems:   Acute embolic stroke (HCC)   Endotracheally intubated   Acute respiratory insufficiency - requiring mechanical ventilation, Management per [facility] day # 4  VTE prophylaxis: Lovenox / SCD's / Heparin Alveda Reasons Diet : DIET DYS 3 Room service appropriate? Yes with Assist; Fluid consistency: Thin   Prior Home Stroke Medications: No antithrombotic Prior to Admission  Hospital Current Stroke Medications: NONE, at this time Stroke New Meds Plan: Now on aspirinc  Discharge Stroke Meds: Please discharge patient on Aspirin  Disposition: Final discharge disposition not confirmed Therapy Recs:  PENDING Follow Recs:  Follow-up Information    Garvin Fila, MD. Schedule an appointment as soon as possible for a visit in 6 week(s).   Specialties:  Neurology,  Contact information: [address] [place] [address] [phone]          Patient, No Pcp Per - Case Management consulted today Follow up with [facility] in 6 weeks  FAMILY UPDATES: Husband and son  at bedside  TEAM UPDATES: [place] Team    I have personally examined this patient, reviewed notes, independently viewed imaging studies, participated in medical decision making and plan of care.ROS completed by me personally and pertinent positives fully documented  I have made any additions or clarifications directly to the above note. Agree with note above. . She presented with terminal left ICA and middle cerebral artery occlusion and underwent emergent mechanical thrombectomy with complete revascularization. Continue strict blood pressure control and  start low-dose beta blockers for resting tachycardia.. Transfer to neurology floor bed today. Plan TEE and loop recorder on Monday. Transfer to inpatient rehabilitation after TEE and loop Discussed with Dr. Sabra Heck and   patient's husband. Etiology of resting tachycardia unclear.   CBC, BMP stable and chest x-ray shows no definite evidence of pneumonia  plan panda tube for feeding if she fails swallow eval today. This patient is critically ill and at significant risk of neurological worsening, death and care requires constant monitoring of vital signs, hemodynamics,respiratory and cardiac monitoring, extensive review of multiple databases, frequent neurological assessment, discussion with family, other specialists and medical decision making of high complexity.I have made any additions or clarifications directly to the above note.This critical care time does not reflect procedure time, or teaching time or supervisory time of PA/NP/Med Resident etc but could involve care discussion time.  I spent 30 minutes of neurocritical care time  in the care of  this patient.     Antony Contras, MD Medical Director [facility] Stroke Center Pager: [phone] 08/25/2017 2:33 PM  To contact Stroke Continuity provider, please refer to [email or web address] After hours, contact General Neurology

## 2017-08-25 NOTE — Progress Notes (Signed)
Physical Therapy Treatment Patient Details Name: [name] MRN: [id number] DOB: 07-22-1961 Today's Date: 08/25/2017    History of Present Illness 56 y.o. female with no significant past medical history who presented to [facility] where she was administered IV TPA.  CTA was performed which demonstrated left distal ICA/MCA occlusion. and underwent revascularization of occluded Lt ICA terminus ,Lt MCA. MRI + acute infarct involving L temporal, L posterior insula, L posterior lentiform ncleus and extending into corona radiata. Additional small intarcts in L frontal, parietal and occipital lobes.     PT Comments    Pt pleasant and able to state that she has 2 kids one at home and one in [place]. Oriented to place, situation and self but stating "May" for time. When handed comb she was able to use with left hand appropriately. Pt demonstrates improved cognition, sitting balance and function today. Pt able to perform sitting reaching tasks and use LUE and LLE to move RUE and RLE respectively. Great CIR potential   Follow Up Recommendations  CIR;Supervision/Assistance - 24 hour     Equipment Recommendations       Recommendations for Other Services Rehab consult     Precautions / Restrictions Precautions Precautions: Fall    Mobility  Bed Mobility Overal bed mobility: Needs Assistance Bed Mobility: Rolling;Sidelying to Sit Rolling: Min assist Sidelying to sit: Mod assist       General bed mobility comments: min assist to roll right with use of hand rail and cues for sequence, mod assist to fully elevate trunk from sidelying with assist to move RLE off of bed  Transfers Overall transfer level: Needs assistance   Transfers: Sit to/from Stand Sit to Stand: Max assist;+2 physical assistance Stand pivot transfers: Max assist;+2 physical assistance       General transfer comment: pt with right knee blocked throughout with physical assist for weight shifting,  stepping and preventing RLE buckling. stepped grossly 3' from bed to chair. mod assist for reciprocal scooting in chair  Ambulation/Gait                 Stairs            Wheelchair Mobility    Modified Rankin (Stroke Patients Only) Modified Rankin (Stroke Patients Only) Pre-Morbid Rankin Score: No symptoms Modified Rankin: Severe disability     Balance Overall balance assessment: Needs assistance   Sitting balance-Leahy Scale: Poor Sitting balance - Comments: pt with midline posture and able to maintain without physical assist. with guarding able to reach in all directions and correct LOB with LUE x 5 min EOB     Standing balance-Leahy Scale: Zero Standing balance comment: R knee buckling with support for weight shift, balance                            Cognition Arousal/Alertness: Awake/alert Behavior During Therapy: Flat affect Overall Cognitive Status: Impaired/Different from baseline Area of Impairment: Attention;Problem solving;Safety/judgement;Awareness                 Orientation Level: Time;Disoriented to Current Attention Level: Sustained   Following Commands: Follows one step commands consistently Safety/Judgement: Decreased awareness of safety;Decreased awareness of deficits Awareness: Emergent Problem Solving: Slow processing;Difficulty sequencing;Requires verbal cues;Requires tactile cues General Comments: pt able to use comb unassisted today, looking to right and able to follow commands to use LUE to move RUE      Exercises      General Comments  Pertinent Vitals/Pain Pain Assessment: No/denies pain    Home Living                      Prior Function            PT Goals (current goals can now be found in the care plan section) Progress towards PT goals: Progressing toward goals    Frequency    Min 4X/week      PT Plan Current plan remains appropriate    Co-evaluation               AM-PAC PT "6 Clicks" Daily Activity  Outcome Measure  Difficulty turning Stephenson in bed (including adjusting bedclothes, sheets and blankets)?: Unable Difficulty moving from lying on back to sitting on the side of the bed? : Unable Difficulty sitting down on and standing up from a chair with arms (e.g., wheelchair, bedside commode, etc,.)?: Unable Help needed moving to and from a bed to chair (including a wheelchair)?: A Lot Help needed walking in hospital room?: Total Help needed climbing 3-5 steps with a railing? : Total 6 Click Score: 7    End of Session Equipment Utilized During Treatment: Gait belt Activity Tolerance: Patient tolerated treatment well Patient left: in chair;with call bell/phone within reach;with chair alarm set;with family/visitor present Nurse Communication: Mobility status;Precautions PT Visit Diagnosis: Other abnormalities of gait and mobility (R26.89);Hemiplegia and hemiparesis Hemiplegia - Right/Left: Right Hemiplegia - dominant/non-dominant: Dominant Hemiplegia - caused by: Cerebral infarction     Time: 0934-1000 PT Time Calculation (min) (ACUTE ONLY): 26 min  Charges:  $Therapeutic Activity: 8-22 mins $Neuromuscular Re-education: 8-22 mins                    G Codes:       [name], PT [phone]    [name] [name] 08/25/2017, 1:12 PM

## 2017-08-26 LAB — BASIC METABOLIC PANEL
ANION GAP: 8 (ref 5–15)
BUN: 16 mg/dL (ref 6–20)
CO2: 25 mmol/L (ref 22–32)
Calcium: 8.8 mg/dL — ABNORMAL LOW (ref 8.9–10.3)
Chloride: 106 mmol/L (ref 101–111)
Creatinine, Ser: 0.53 mg/dL (ref 0.44–1.00)
GFR calc Af Amer: >60 mL/min (ref >60)
Glucose, Bld: 92 mg/dL (ref 65–99)
POTASSIUM: 3.3 mmol/L — AB (ref 3.5–5.1)
Sodium: 139 mmol/L (ref 135–145)

## 2017-08-26 LAB — CBC
HEMATOCRIT: 40 % (ref 36.0–46.0)
HEMOGLOBIN: 13.5 g/dL (ref 12.0–15.0)
MCH: 31.8 pg (ref 26.0–34.0)
MCHC: 33.8 g/dL (ref 30.0–36.0)
MCV: 94.3 fL (ref 78.0–100.0)
Platelets: 345 10*3/uL (ref 150–400)
RBC: 4.24 MIL/uL (ref 3.87–5.11)
RDW: 13.3 % (ref 11.5–15.5)
WBC: 11.1 10*3/uL — ABNORMAL HIGH (ref 4.0–10.5)

## 2017-08-26 LAB — C-REACTIVE PROTEIN: CRP: 7.4 mg/dL — ABNORMAL HIGH (ref <1.0)

## 2017-08-26 MED ORDER — POTASSIUM CHLORIDE 20 MEQ PO PACK
20.0000 meq | PACK | Freq: Two times a day (BID) | ORAL | Status: AC
  Administered 2017-08-26 – 2017-08-27 (×4): 20 meq via ORAL
  Filled 2017-08-26 (×5): qty 1

## 2017-08-26 NOTE — Progress Notes (Addendum)
  Speech Language Pathology Treatment: Dysphagia  Patient Details Name: [name] MRN: [id number] DOB: 1961-01-07 Today's Date: 08/26/2017 Time: 1445-1510 SLP Time Calculation (min) (ACUTE ONLY): 25 min  Assessment / Plan / Recommendation Clinical Impression  Pt seen to assess po tolerance of diet and reinforce effective compensation strategies.  Spouse present and reports pt has a h/o coughing on thin liquids prior to admission.  SLP provided pt with intake of thin water via straw - she required total cues to tuck chin adequately - waning to moderate cues prior to session completion.  Despite this, she continued to have delayed mild coughing- Pt and spouse report this is normal for her even prior to admission.  Informed pt and spouse to increased aspiration pneumonia risk with decreased mobility from CVA. Advised pt and spouse to have pt work on strengthening cough/hocking ability for maximal airway protection.  Showed pt/spouse video of normal MBS and pt's MBS from 08/25/17 reinforcing reasoning for chin tuck.  Of note, pt does also acknowledge issues with reflux symptoms.  Pt with h/o smoking per her and spouse statement.  As pt is afebrile, WBC improving and  lungs are clear recommend to continue diet with strict precautions.  Question if pt has baseline refluxing that may be contributing to cough with intake.   SLP will follow up for language/speech evaluation and further dysphagia treatment.  Thanks!      HPI HPI: 56 year old right handed female with no significant PMH/ has not seen physican in 15 years who presented to [facility] with acute onset of aphasia, right sided weakness and left gaze 11/26. LSW at 1515. CT head negative for ICH however CTA showed left MCA and left ICA occlusion. TPA given at [place]. Transferred to [facility] for endovascular revascularization 1/26, Intubated for procedure. Extubated 11/28.       SLP Plan  Continue with current plan of care        Recommendations  Diet recommendations: Dysphagia 3 (mechanical soft);Thin liquid Liquids provided via: Straw Medication Administration: Crushed with puree Supervision: Full supervision/cueing for compensatory strategies(family supervision is adequate) Compensations: Slow rate;Small sips/bites Postural Changes and/or Swallow Maneuvers: Chin tuck(intermittent cough/hock)                Oral Care Recommendations: Oral care BID(pt has oral suction set up) Follow up Recommendations: Inpatient Rehab SLP Visit Diagnosis: Aphasia (R47.01) Plan: Continue with current plan of care       GO               Luanna Salk, [facility] SLP [phone]  Macario Golds 08/26/2017, 3:33 PM

## 2017-08-26 NOTE — Progress Notes (Signed)
STROKE TEAM PROGRESS NOTE   SUBJECTIVE (INTERVAL HISTORY)   Her mother and husband are   at the bedside. Patient is sitting up in bed and  Is now able to speak some but remains  with dense right hemiplegia. She speaks a few words, short sentences and follow midline commands No new events reported overnight per nursing.  OBJECTIVE Lab Results: CBC:  Recent Labs  Lab 08/23/17 0445 08/24/17 0556 08/26/17 0724  WBC 15.7* 14.8* 11.1*  HGB 12.8 12.1 13.5  HCT 39.6 38.1 40.0  MCV 96.8 97.7 94.3  PLT 361 304 345   BMP: Recent Labs  Lab 08/22/17 0811 08/22/17 1016 08/23/17 0445 08/24/17 0556 08/25/17 1058 08/26/17 0724  NA 137  --  141 142 141 139  K 3.8  --  3.5 3.1* 3.7 3.3*  CL 107  --  113* 112* 108 106  CO2 23  --  23 25 26 25   GLUCOSE 148*  --  119* 86 103* 92  BUN 13  --  12 9 11 16   CREATININE 0.58  --  0.48 0.44 0.40* 0.53  CALCIUM 8.3*  --  8.2* 8.4* 9.1 8.8*  MG  --  1.9  --   --   --   --   PHOS  --  3.7  --   --   --   --    Liver Function Tests:  Recent Labs  Lab 08/21/17 1829  AST 32  ALT 27  ALKPHOS 73  BILITOT 0.4  PROT 8.3*  ALBUMIN 4.5   Cardiac Enzymes:  Recent Labs  Lab 08/22/17 1016  TROPONINI 0.03*   Coagulation Studies:  No results for input(s): APTT, INR in the last 72 hours. Alcohol Level:  Recent Labs  Lab 08/21/17 [place] <10    PHYSICAL EXAM Temp:  [97.5 F (36.4 C)-98.2 F (36.8 C)] 98.2 F (36.8 C) (12/01 0934) Pulse Rate:  [86-99] 92 (12/01 0934) Resp:  [14-19] 16 (12/01 0934) BP: (135-152)/(67-95) 139/67 (12/01 0934) SpO2:  [93 %-99 %] 93 % (12/01 0934) General - obese middle-age Caucasian lady who is   in no apparent distress but has resting tachycardia Respiratory - Lungs clear bilaterally. No wheezing. Cardiovascular - Regular rate and rhythm   Neuro: Mental Status: Patient is awake aphasic,speaks  short sentences only and will follow simple midline commands Cranial Nerves: II: Does not blink to threat  from the right.  Pupils are equal, round, and reactive to light.   III,IV, VI: Left gaze preference V: Facial sensation appears symmetric  VII: Facial movement is weak on the right Motor: She has a flaccid right hemiparesis, with no movement in the right arm in flexion to noxious stimuli in the right leg.  She moves her left side voluntarily with full strength. Sensory: She does flinch to noxious stimuli, but this is reduced on the right side. Cerebellar: She does not perform Gait deferred  IMAGING: I have personally reviewed the radiological images below and agree with the radiology interpretations.  Ct Angio Head & Neck W Or Wo Contrast Result Date: 08/21/2017 IMPRESSION: 1. Positive for emergent large vessel occlusion. No flow seen in the left M1 and distal intracranial LICA. There is flow seen in the left M2/3 branches suggesting favorable collaterals. Aspects scored on prior noncontrast head CT. 2. Severe atheromatous stenosis at the left ICA bulb which may be an embolic source. There is comparatively poor flow between the stenosis and intracranial occlusion. 3. ~ 40% right ICA  bulb atheromatous narrowing. 4. 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 There is good flow in the distal left V4 segment and left PICA, which may be retrograde. 5. Advanced atherosclerosis of the aortic arch with irregular plaque/thrombus at the isthmus. Motion affects visualization of the great vessel ostia.   Ct Head Wo Contrast Result Date: 08/21/2017 IMPRESSION: Normal head CT. No intraparenchymal hemorrhage, contrast staining or cytotoxic edema.   Portable Chest Xray Result Date: 08/22/2017 IMPRESSION: 1.  Stable lines and tubes. 2. Stable ventilation with mild left lung base atelectasis.   Portable Chest Xray Result Date: 08/21/2017  IMPRESSION: Endotracheal tube tip about 2.9 cm superior to carina. Patchy atelectasis versus minimal infiltrate at the left base.  Ct  Head Code Stroke Wo Contrast Result Date: 08/21/2017 IMPRESSION: Hyperdense distal left ICA and M1 segment consistent with thrombus in this setting. Small acute infarcts seen in the left insula, left putamen, and possibly left parietal cortex ASPECTS is 7 to 8.   Echocardiogram: not done                                    PENDING B/L LE Duplex:                                                       Negative for DVT   MRI Brain:                                                               Acute infarction involving left temporal operculum, left posterior insula, left posterior lentiform nucleus, and extending into corona radiata and caudate body, approximately 13 cc. Additional small foci of acute infarction are present in left posterior temporal, left frontal, left parietal, and left occipital lobes.   _____________________________________________________________________ ASSESSMENT: Ms. BARBARANN KELLY is a 56 y.o. female with PMH of no significant past medical history (has not been to a physician in 15 years) who presents with right-sided weakness that started sometime between 3:15 and 5:15 PM. Her husband left her around 3:15 PM and then her brother found her on the floor at 5:15 PM.  She was taken to [facility] where she was administered IV TPA.  CTA was performed which demonstrated left distal ICA/MCA occlusion.  She was within the 6-hour window for IR intervention, and was taken directly to the Cath Lab.S/P bilateral common carotid and RT vert arteriograms,followed by complete revascularization of occluded Lt ICA terminus ,Lt MCA M! And M2 segs with x1 pass with solitaire 53mm x 40 mm FR retrieval device achieving TICI 3 reperfusion by Dr Estanislado Pandy.  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  Suspected Etiology: Likely cardioembolic source  Resultant Symptoms: aphasia, right sided weakness and left gaze  preference Stroke Risk Factors: hyperlipidemia, hypertension and no medical care for greater than 15 years Other Stroke Risk Factors: Advanced age, Cigarette smoker,   Outstanding Stroke Work-up Studies: Echocardiogram:  Left ventricle: The cavity size was normal. Wall thickness was   increased in a pattern of mild LVH. Systolic function was normal.   The estimated ejection fraction was in the range of 55% to 60%.   Wall motion was normal; there were no regional wall motion   abnormalities. The study is not technically sufficient to allow   evaluation of LV diastolic function B/L LE Duplex:     No DVT                                                      PLAN  08/26/2017:  Start ASA  Started Atorvastatin 80 mg  Started oral diet and will start metoprolol 12.5 twice daily for resting tachycardia  Continue PT/OT/SLP   Transfer to neurology floor bed today.  TEE and loop recorder on Monday.  Transfer to inpatient rehabilitation after TEE/loop  Leukocytosis Remains afebrile U/A negative CXR stable with mild left lung base atelectasis.  BMP labs ok Management per CCM - Appreciate management  R/O AFIB: May need Outpatient TEE and Loop Recorder Placement prior to discharge Will continue to monitor closely   DYSPHAGIA: NPO until passes SLP swallow evaluation, once extubated  HYPERTENSION EMERGENCY: Stable, remains sedated/intubated on mechanical ventiation S/P Thrombectomy - SBP Parameters per IR Nicardipine drip and Labetolol PRN in progress Long term BP goal normotensive. Home Meds: NONE  HYPERLIPIDEMIA:    Component Value Date/Time   CHOL 237 (H) 08/22/2017 0811   TRIG 295 (H) 08/22/2017 0811   TRIG 301 (H) 08/22/2017 0811   HDL 35 (L) 08/22/2017 0811   CHOLHDL 6.8 08/22/2017 0811   VLDL 59 (H) 08/22/2017 0811   LDLCALC 143 (H) 08/22/2017 0811  Home Meds:  NONE LDL  goal < 70 Started on Lipitor to 80 mg daily Continue statin at  discharge  R/O DIABETES: Lab Results  Component Value Date   HGBA1C 5.5 08/22/2017  HgbA1c goal < 7.0   TOBACCO ABUSE Current smoker Smoking cessation counseling will be provided, once extubated Nicotine patch provided PRN  Other Active Problems: Active Problems:   Acute embolic stroke (HCC)   Endotracheally intubated   Acute respiratory insufficiency - requiring mechanical ventilation, Management per [facility] day # 5  VTE prophylaxis: Lovenox / SCD's / Heparin Alveda Reasons Diet : DIET DYS 3 Room service appropriate? Yes with Assist; Fluid consistency: Thin Diet NPO time specified   Prior Home Stroke Medications: No antithrombotic Prior to Admission  Hospital Current Stroke Medications: NONE, at this time Stroke New Meds Plan: Now on aspirinc  Discharge Stroke Meds: Please discharge patient on Aspirin  Disposition: Final discharge disposition not confirmed Therapy Recs:  PENDING Follow Recs:  Follow-up Information    Garvin Fila, MD. Schedule an appointment as soon as possible for a visit in 6 week(s).   Specialties:  Neurology,  Contact information: [address] [place] [address] [phone]          Patient, No Pcp Per - Case Management consulted today Follow up with [facility] in 6 weeks  FAMILY UPDATES: Husband and son at bedside  TEAM UPDATES: [place] replace potassium. Continue ongoing therapies. Await TEE and loop recorder on Monday followed by transfer to inpatient rehabilitation after that. Long discussion with husband and mother  and answered questions. Greater than 50% time during this 25 minute visit was spent on counseling and coordination of care about her cryptogenic stroke, aphasia, hemiplegia and answering questions  Antony Contras, MD Medical Director [place] Pager: [phone] 08/26/2017 12:44 PM  To contact Stroke Continuity provider, please refer to  [email or web address] After hours, contact General Neurology

## 2017-08-27 LAB — CBC
HCT: 39.1 % (ref 36.0–46.0)
Hemoglobin: 12.9 g/dL (ref 12.0–15.0)
MCH: 31.2 pg (ref 26.0–34.0)
MCHC: 33 g/dL (ref 30.0–36.0)
MCV: 94.4 fL (ref 78.0–100.0)
PLATELETS: 360 10*3/uL (ref 150–400)
RBC: 4.14 MIL/uL (ref 3.87–5.11)
RDW: 13.1 % (ref 11.5–15.5)
WBC: 9.4 10*3/uL (ref 4.0–10.5)

## 2017-08-27 LAB — BASIC METABOLIC PANEL
Anion gap: 5 (ref 5–15)
BUN: 13 mg/dL (ref 6–20)
CALCIUM: 8.5 mg/dL — AB (ref 8.9–10.3)
CO2: 24 mmol/L (ref 22–32)
Chloride: 109 mmol/L (ref 101–111)
Creatinine, Ser: 0.47 mg/dL (ref 0.44–1.00)
GFR calc Af Amer: >60 mL/min (ref >60)
GLUCOSE: 101 mg/dL — AB (ref 65–99)
Potassium: 3.7 mmol/L (ref 3.5–5.1)
Sodium: 138 mmol/L (ref 135–145)

## 2017-08-27 NOTE — H&P (View-Only) (Signed)
STROKE TEAM PROGRESS NOTE   SUBJECTIVE (INTERVAL HISTORY)   Her family is not   at the bedside. Patient is sitting up in bed and  Is now able to speak some but remains  with dense right hemiplegia. She speaks   short sentences and follow midline commands No new events reported overnight per nursing.  OBJECTIVE Lab Results: CBC:  Recent Labs  Lab 08/24/17 0556 08/26/17 0724 08/27/17 0342  WBC 14.8* 11.1* 9.4  HGB 12.1 13.5 12.9  HCT 38.1 40.0 39.1  MCV 97.7 94.3 94.4  PLT 304 345 360   BMP: Recent Labs  Lab 08/22/17 1016 08/23/17 0445 08/24/17 0556 08/25/17 1058 08/26/17 0724 08/27/17 0342  NA  --  141 142 141 139 138  K  --  3.5 3.1* 3.7 3.3* 3.7  CL  --  113* 112* 108 106 109  CO2  --  23 25 26 25 24   GLUCOSE  --  119* 86 103* 92 101*  BUN  --  12 9 11 16 13   CREATININE  --  0.48 0.44 0.40* 0.53 0.47  CALCIUM  --  8.2* 8.4* 9.1 8.8* 8.5*  MG 1.9  --   --   --   --   --   PHOS 3.7  --   --   --   --   --    Liver Function Tests:  Recent Labs  Lab 08/21/17 1829  AST 32  ALT 27  ALKPHOS 73  BILITOT 0.4  PROT 8.3*  ALBUMIN 4.5   Cardiac Enzymes:  Recent Labs  Lab 08/22/17 1016  TROPONINI 0.03*   Coagulation Studies:  No results for input(s): APTT, INR in the last 72 hours. Alcohol Level:  Recent Labs  Lab 08/21/17 [place] <10    PHYSICAL EXAM Temp:  [97.7 F (36.5 C)-98.6 F (37 C)] 98 F (36.7 C) (12/02 0800) Pulse Rate:  [78-101] 92 (12/02 0549) Resp:  [18-20] 18 (12/02 0800) BP: (117-140)/(66-82) 117/82 (12/02 0800) SpO2:  [92 %-95 %] 94 % (12/02 0800) General - obese middle-age Caucasian lady who is   in no apparent distress but has resting tachycardia Respiratory - Lungs clear bilaterally. No wheezing. Cardiovascular - Regular rate and rhythm   Neuro: Mental Status: Patient is awake aphasic,speaks  short sentences only and will follow simple midline commands Cranial Nerves: II: Does not blink to threat from the right.  Pupils are  equal, round, and reactive to light.   III,IV, VI: Left gaze preference V: Facial sensation appears symmetric  VII: Facial movement is weak on the right Motor: She has a flaccid right hemiparesis, with no movement in the right arm in flexion to noxious stimuli in the right leg.  She moves her left side voluntarily with full strength. Sensory: She does flinch to noxious stimuli, but this is reduced on the right side. Cerebellar: She does not perform Gait deferred  IMAGING: I have personally reviewed the radiological images below and agree with the radiology interpretations.  Ct Angio Head & Neck W Or Wo Contrast Result Date: 08/21/2017 IMPRESSION: 1. Positive for emergent large vessel occlusion. No flow seen in the left M1 and distal intracranial LICA. There is flow seen in the left M2/3 branches suggesting favorable collaterals. Aspects scored on prior noncontrast head CT. 2. Severe atheromatous stenosis at the left ICA bulb which may be an embolic source. There is comparatively poor flow between the stenosis and intracranial occlusion. 3. ~ 40% right ICA bulb atheromatous  narrowing. 4. 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 There is good flow in the distal left V4 segment and left PICA, which may be retrograde. 5. Advanced atherosclerosis of the aortic arch with irregular plaque/thrombus at the isthmus. Motion affects visualization of the great vessel ostia.   Ct Head Wo Contrast Result Date: 08/21/2017 IMPRESSION: Normal head CT. No intraparenchymal hemorrhage, contrast staining or cytotoxic edema.   Portable Chest Xray Result Date: 08/22/2017 IMPRESSION: 1.  Stable lines and tubes. 2. Stable ventilation with mild left lung base atelectasis.   Portable Chest Xray Result Date: 08/21/2017  IMPRESSION: Endotracheal tube tip about 2.9 cm superior to carina. Patchy atelectasis versus minimal infiltrate at the left base.  Ct Head Code Stroke Wo  Contrast Result Date: 08/21/2017 IMPRESSION: Hyperdense distal left ICA and M1 segment consistent with thrombus in this setting. Small acute infarcts seen in the left insula, left putamen, and possibly left parietal cortex ASPECTS is 7 to 8.   Echocardiogram: not done                                    PENDING B/L LE Duplex:                                                       Negative for DVT   MRI Brain:                                                               Acute infarction involving left temporal operculum, left posterior insula, left posterior lentiform nucleus, and extending into corona radiata and caudate body, approximately 13 cc. Additional small foci of acute infarction are present in left posterior temporal, left frontal, left parietal, and left occipital lobes.   _____________________________________________________________________ ASSESSMENT: Ms. GWYNNETH FABIO is a 56 y.o. female with PMH of no significant past medical history (has not been to a physician in 15 years) who presents with right-sided weakness that started sometime between 3:15 and 5:15 PM. Her husband left her around 3:15 PM and then her brother found her on the floor at 5:15 PM.  She was taken to [facility] where she was administered IV TPA.  CTA was performed which demonstrated left distal ICA/MCA occlusion.  She was within the 6-hour window for IR intervention, and was taken directly to the Cath Lab.S/P bilateral common carotid and RT vert arteriograms,followed by complete revascularization of occluded Lt ICA terminus ,Lt MCA M! And M2 segs with x1 pass with solitaire 50mm x 40 mm FR retrieval device achieving TICI 3 reperfusion by Dr Estanislado Pandy.  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  Suspected Etiology: Likely cardioembolic source  Resultant Symptoms: aphasia, right sided weakness and left gaze preference Stroke Risk  Factors: hyperlipidemia, hypertension and no medical care for greater than 15 years Other Stroke Risk Factors: Advanced age, Cigarette smoker,   Outstanding Stroke Work-up Studies: Echocardiogram:  Left ventricle: The cavity size was normal. Wall thickness was   increased in a pattern of mild LVH. Systolic function was normal.   The estimated ejection fraction was in the range of 55% to 60%.   Wall motion was normal; there were no regional wall motion   abnormalities. The study is not technically sufficient to allow   evaluation of LV diastolic function B/L LE Duplex:     No DVT                                                      PLAN  08/27/2017:  Start ASA  Started Atorvastatin 80 mg  Started oral diet and will start metoprolol 12.5 twice daily for resting tachycardia  Continue PT/OT/SLP   Transfer to neurology floor bed today.  TEE and loop recorder on Monday.  Transfer to inpatient rehabilitation after TEE/loop  Leukocytosis Remains afebrile U/A negative CXR stable with mild left lung base atelectasis.  BMP labs ok Management per CCM - Appreciate management  R/O AFIB: plan TEE and Loop Recorder Placement  08/28/17 prior to discharge to rehab Will continue to monitor closely   DYSPHAGIA: Dysphagia 3 diet. ST following closely HYPERTENSION EMERGENCY: Stable, remains sedated/intubated on mechanical ventiation S/P Thrombectomy - SBP Parameters per IR Nicardipine drip and Labetolol PRN in progress6Long term BP goal normotensive. Home Meds: NONE  HYPERLIPIDEMIA:    Component Value Date/Time   CHOL 237 (H) 08/22/2017 0811   TRIG 295 (H) 08/22/2017 0811   TRIG 301 (H) 08/22/2017 0811   HDL 35 (L) 08/22/2017 0811   CHOLHDL 6.8 08/22/2017 0811   VLDL 59 (H) 08/22/2017 0811   LDLCALC 143 (H) 08/22/2017 0811  Home Meds:  NONE LDL  goal < 70 Started on Lipitor to 80 mg daily Continue statin at discharge  R/O DIABETES: Lab Results   Component Value Date   HGBA1C 5.5 08/22/2017  HgbA1c goal < 7.0   TOBACCO ABUSE Current smoker Smoking cessation counseling will be provided, once extubated Nicotine patch provided PRN  Other Active Problems: Active Problems:   Acute embolic stroke (HCC)   Endotracheally intubated   Acute respiratory insufficiency - requiring mechanical ventilation, Management per [facility] day # 6  VTE prophylaxis: Lovenox / SCD's / Heparin Alveda Reasons Diet : DIET DYS 3 Room service appropriate? Yes with Assist; Fluid consistency: Thin Diet NPO time specified   Prior Home Stroke Medications: No antithrombotic Prior to Admission  Hospital Current Stroke Medications: NONE, at this time Stroke New Meds Plan: Now on aspirinc  Discharge Stroke Meds: Please discharge patient on Aspirin  Disposition: Final discharge disposition not confirmed Therapy Recs:  PENDING Follow Recs:  Follow-up Information    Garvin Fila, MD. Schedule an appointment as soon as possible for a visit in 6 week(s).   Specialties:  Neurology,  Contact information: [address] [place] [address] [phone]          Patient, No Pcp Per - Case Management consulted today Follow up with [facility] in 6 weeks  FAMILY UPDATES: Husband and son at bedside  TEAM UPDATES: [place]  . Continue ongoing therapies. Await TEE and loop recorder on Monday followed by transfer to inpatient rehabilitation after that.    Antony Contras, MD Medical Director  [facility] Pager: [phone] 08/27/2017 11:11 AM  To contact Stroke Continuity provider, please refer to [email or web address] After hours, contact General Neurology

## 2017-08-27 NOTE — Progress Notes (Signed)
STROKE TEAM PROGRESS NOTE   SUBJECTIVE (INTERVAL HISTORY)   Her family is not   at the bedside. Patient is sitting up in bed and  Is now able to speak some but remains  with dense right hemiplegia. She speaks   short sentences and follow midline commands No new events reported overnight per nursing.  OBJECTIVE Lab Results: CBC:  Recent Labs  Lab 08/24/17 0556 08/26/17 0724 08/27/17 0342  WBC 14.8* 11.1* 9.4  HGB 12.1 13.5 12.9  HCT 38.1 40.0 39.1  MCV 97.7 94.3 94.4  PLT 304 345 360   BMP: Recent Labs  Lab 08/22/17 1016 08/23/17 0445 08/24/17 0556 08/25/17 1058 08/26/17 0724 08/27/17 0342  NA  --  141 142 141 139 138  K  --  3.5 3.1* 3.7 3.3* 3.7  CL  --  113* 112* 108 106 109  CO2  --  23 25 26 25 24   GLUCOSE  --  119* 86 103* 92 101*  BUN  --  12 9 11 16 13   CREATININE  --  0.48 0.44 0.40* 0.53 0.47  CALCIUM  --  8.2* 8.4* 9.1 8.8* 8.5*  MG 1.9  --   --   --   --   --   PHOS 3.7  --   --   --   --   --    Liver Function Tests:  Recent Labs  Lab 08/21/17 1829  AST 32  ALT 27  ALKPHOS 73  BILITOT 0.4  PROT 8.3*  ALBUMIN 4.5   Cardiac Enzymes:  Recent Labs  Lab 08/22/17 1016  TROPONINI 0.03*   Coagulation Studies:  No results for input(s): APTT, INR in the last 72 hours. Alcohol Level:  Recent Labs  Lab 08/21/17 [place] <10    PHYSICAL EXAM Temp:  [97.7 F (36.5 C)-98.6 F (37 C)] 98 F (36.7 C) (12/02 0800) Pulse Rate:  [78-101] 92 (12/02 0549) Resp:  [18-20] 18 (12/02 0800) BP: (117-140)/(66-82) 117/82 (12/02 0800) SpO2:  [92 %-95 %] 94 % (12/02 0800) General - obese middle-age Caucasian lady who is   in no apparent distress but has resting tachycardia Respiratory - Lungs clear bilaterally. No wheezing. Cardiovascular - Regular rate and rhythm   Neuro: Mental Status: Patient is awake aphasic,speaks  short sentences only and will follow simple midline commands Cranial Nerves: II: Does not blink to threat from the right.  Pupils are  equal, round, and reactive to light.   III,IV, VI: Left gaze preference V: Facial sensation appears symmetric  VII: Facial movement is weak on the right Motor: She has a flaccid right hemiparesis, with no movement in the right arm in flexion to noxious stimuli in the right leg.  She moves her left side voluntarily with full strength. Sensory: She does flinch to noxious stimuli, but this is reduced on the right side. Cerebellar: She does not perform Gait deferred  IMAGING: I have personally reviewed the radiological images below and agree with the radiology interpretations.  Ct Angio Head & Neck W Or Wo Contrast Result Date: 08/21/2017 IMPRESSION: 1. Positive for emergent large vessel occlusion. No flow seen in the left M1 and distal intracranial LICA. There is flow seen in the left M2/3 branches suggesting favorable collaterals. Aspects scored on prior noncontrast head CT. 2. Severe atheromatous stenosis at the left ICA bulb which may be an embolic source. There is comparatively poor flow between the stenosis and intracranial occlusion. 3. ~ 40% right ICA bulb atheromatous  narrowing. 4. 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 There is good flow in the distal left V4 segment and left PICA, which may be retrograde. 5. Advanced atherosclerosis of the aortic arch with irregular plaque/thrombus at the isthmus. Motion affects visualization of the great vessel ostia.   Ct Head Wo Contrast Result Date: 08/21/2017 IMPRESSION: Normal head CT. No intraparenchymal hemorrhage, contrast staining or cytotoxic edema.   Portable Chest Xray Result Date: 08/22/2017 IMPRESSION: 1.  Stable lines and tubes. 2. Stable ventilation with mild left lung base atelectasis.   Portable Chest Xray Result Date: 08/21/2017  IMPRESSION: Endotracheal tube tip about 2.9 cm superior to carina. Patchy atelectasis versus minimal infiltrate at the left base.  Ct Head Code Stroke Wo  Contrast Result Date: 08/21/2017 IMPRESSION: Hyperdense distal left ICA and M1 segment consistent with thrombus in this setting. Small acute infarcts seen in the left insula, left putamen, and possibly left parietal cortex ASPECTS is 7 to 8.   Echocardiogram: not done                                    PENDING B/L LE Duplex:                                                       Negative for DVT   MRI Brain:                                                               Acute infarction involving left temporal operculum, left posterior insula, left posterior lentiform nucleus, and extending into corona radiata and caudate body, approximately 13 cc. Additional small foci of acute infarction are present in left posterior temporal, left frontal, left parietal, and left occipital lobes.   _____________________________________________________________________ ASSESSMENT: Ms. KALEIA LONGHI is a 56 y.o. female with PMH of no significant past medical history (has not been to a physician in 15 years) who presents with right-sided weakness that started sometime between 3:15 and 5:15 PM. Her husband left her around 3:15 PM and then her brother found her on the floor at 5:15 PM.  She was taken to [facility] where she was administered IV TPA.  CTA was performed which demonstrated left distal ICA/MCA occlusion.  She was within the 6-hour window for IR intervention, and was taken directly to the Cath Lab.S/P bilateral common carotid and RT vert arteriograms,followed by complete revascularization of occluded Lt ICA terminus ,Lt MCA M! And M2 segs with x1 pass with solitaire 9mm x 40 mm FR retrieval device achieving TICI 3 reperfusion by Dr Estanislado Pandy.  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  Suspected Etiology: Likely cardioembolic source  Resultant Symptoms: aphasia, right sided weakness and left gaze preference Stroke Risk  Factors: hyperlipidemia, hypertension and no medical care for greater than 15 years Other Stroke Risk Factors: Advanced age, Cigarette smoker,   Outstanding Stroke Work-up Studies: Echocardiogram:  Left ventricle: The cavity size was normal. Wall thickness was   increased in a pattern of mild LVH. Systolic function was normal.   The estimated ejection fraction was in the range of 55% to 60%.   Wall motion was normal; there were no regional wall motion   abnormalities. The study is not technically sufficient to allow   evaluation of LV diastolic function B/L LE Duplex:     No DVT                                                      PLAN  08/27/2017:  Start ASA  Started Atorvastatin 80 mg  Started oral diet and will start metoprolol 12.5 twice daily for resting tachycardia  Continue PT/OT/SLP   Transfer to neurology floor bed today.  TEE and loop recorder on Monday.  Transfer to inpatient rehabilitation after TEE/loop  Leukocytosis Remains afebrile U/A negative CXR stable with mild left lung base atelectasis.  BMP labs ok Management per CCM - Appreciate management  R/O AFIB: plan TEE and Loop Recorder Placement  08/28/17 prior to discharge to rehab Will continue to monitor closely   DYSPHAGIA: Dysphagia 3 diet. ST following closely HYPERTENSION EMERGENCY: Stable, remains sedated/intubated on mechanical ventiation S/P Thrombectomy - SBP Parameters per IR Nicardipine drip and Labetolol PRN in progress6Long term BP goal normotensive. Home Meds: NONE  HYPERLIPIDEMIA:    Component Value Date/Time   CHOL 237 (H) 08/22/2017 0811   TRIG 295 (H) 08/22/2017 0811   TRIG 301 (H) 08/22/2017 0811   HDL 35 (L) 08/22/2017 0811   CHOLHDL 6.8 08/22/2017 0811   VLDL 59 (H) 08/22/2017 0811   LDLCALC 143 (H) 08/22/2017 0811  Home Meds:  NONE LDL  goal < 70 Started on Lipitor to 80 mg daily Continue statin at discharge  R/O DIABETES: Lab Results   Component Value Date   HGBA1C 5.5 08/22/2017  HgbA1c goal < 7.0   TOBACCO ABUSE Current smoker Smoking cessation counseling will be provided, once extubated Nicotine patch provided PRN  Other Active Problems: Active Problems:   Acute embolic stroke (HCC)   Endotracheally intubated   Acute respiratory insufficiency - requiring mechanical ventilation, Management per [facility] day # 6  VTE prophylaxis: Lovenox / SCD's / Heparin Alveda Reasons Diet : DIET DYS 3 Room service appropriate? Yes with Assist; Fluid consistency: Thin Diet NPO time specified   Prior Home Stroke Medications: No antithrombotic Prior to Admission  Hospital Current Stroke Medications: NONE, at this time Stroke New Meds Plan: Now on aspirinc  Discharge Stroke Meds: Please discharge patient on Aspirin  Disposition: Final discharge disposition not confirmed Therapy Recs:  PENDING Follow Recs:  Follow-up Information    Garvin Fila, MD. Schedule an appointment as soon as possible for a visit in 6 week(s).   Specialties:  Neurology,  Contact information: [address] [place] [address] [phone]          Patient, No Pcp Per - Case Management consulted today Follow up with [facility] in 6 weeks  FAMILY UPDATES: Husband and son at bedside  TEAM UPDATES: [employer]  . Continue ongoing therapies. Await TEE and loop recorder on Monday followed by transfer to inpatient rehabilitation after that.    Antony Contras, MD Medical Director  [facility] Pager: [phone] 08/27/2017 11:11 AM  To contact Stroke Continuity provider, please refer to [email or web address] After hours, contact General Neurology

## 2017-08-28 ENCOUNTER — Encounter (HOSPITAL_COMMUNITY)
Admission: EM | Disposition: A | Discharge status: Rehabilitation Facility | Payer: Self-pay | Source: Home / Self Care | Attending: Neurology

## 2017-08-28 ENCOUNTER — Inpatient Hospital Stay (HOSPITAL_COMMUNITY): Payer: Self-pay

## 2017-08-28 ENCOUNTER — Inpatient Hospital Stay (HOSPITAL_COMMUNITY)
Admission: RE | Admit: 2017-08-28 | Discharge: 2017-09-14 | DRG: 057 | Disposition: A | Discharge status: Home Health | Payer: Self-pay | Source: Intra-hospital | Attending: Physical Medicine & Rehabilitation | Admitting: Physical Medicine & Rehabilitation

## 2017-08-28 ENCOUNTER — Inpatient Hospital Stay (HOSPITAL_COMMUNITY): Payer: Self-pay | Admitting: Anesthesiology

## 2017-08-28 ENCOUNTER — Encounter (HOSPITAL_COMMUNITY): Payer: Self-pay

## 2017-08-28 DIAGNOSIS — T50995A Adverse effect of other drugs, medicaments and biological substances, initial encounter: Secondary | ICD-10-CM | POA: Diagnosis not present

## 2017-08-28 DIAGNOSIS — Z818 Family history of other mental and behavioral disorders: Secondary | ICD-10-CM

## 2017-08-28 DIAGNOSIS — I69391 Dysphagia following cerebral infarction: Secondary | ICD-10-CM

## 2017-08-28 DIAGNOSIS — I63512 Cerebral infarction due to unspecified occlusion or stenosis of left middle cerebral artery: Secondary | ICD-10-CM

## 2017-08-28 DIAGNOSIS — E876 Hypokalemia: Secondary | ICD-10-CM | POA: Diagnosis not present

## 2017-08-28 DIAGNOSIS — I69392 Facial weakness following cerebral infarction: Secondary | ICD-10-CM

## 2017-08-28 DIAGNOSIS — E8809 Other disorders of plasma-protein metabolism, not elsewhere classified: Secondary | ICD-10-CM | POA: Diagnosis present

## 2017-08-28 DIAGNOSIS — I6389 Other cerebral infarction: Secondary | ICD-10-CM

## 2017-08-28 DIAGNOSIS — G8191 Hemiplegia, unspecified affecting right dominant side: Secondary | ICD-10-CM

## 2017-08-28 DIAGNOSIS — Z716 Tobacco abuse counseling: Secondary | ICD-10-CM

## 2017-08-28 DIAGNOSIS — E785 Hyperlipidemia, unspecified: Secondary | ICD-10-CM

## 2017-08-28 DIAGNOSIS — E46 Unspecified protein-calorie malnutrition: Secondary | ICD-10-CM

## 2017-08-28 DIAGNOSIS — R1310 Dysphagia, unspecified: Secondary | ICD-10-CM | POA: Diagnosis present

## 2017-08-28 DIAGNOSIS — I10 Essential (primary) hypertension: Secondary | ICD-10-CM | POA: Diagnosis present

## 2017-08-28 DIAGNOSIS — I69351 Hemiplegia and hemiparesis following cerebral infarction affecting right dominant side: Principal | ICD-10-CM

## 2017-08-28 DIAGNOSIS — F17210 Nicotine dependence, cigarettes, uncomplicated: Secondary | ICD-10-CM | POA: Diagnosis present

## 2017-08-28 DIAGNOSIS — Z8673 Personal history of transient ischemic attack (TIA), and cerebral infarction without residual deficits: Secondary | ICD-10-CM | POA: Diagnosis present

## 2017-08-28 DIAGNOSIS — D72829 Elevated white blood cell count, unspecified: Secondary | ICD-10-CM | POA: Diagnosis not present

## 2017-08-28 DIAGNOSIS — L231 Allergic contact dermatitis due to adhesives: Secondary | ICD-10-CM

## 2017-08-28 DIAGNOSIS — T783XXA Angioneurotic edema, initial encounter: Secondary | ICD-10-CM

## 2017-08-28 DIAGNOSIS — L259 Unspecified contact dermatitis, unspecified cause: Secondary | ICD-10-CM | POA: Diagnosis not present

## 2017-08-28 DIAGNOSIS — Z8249 Family history of ischemic heart disease and other diseases of the circulatory system: Secondary | ICD-10-CM

## 2017-08-28 DIAGNOSIS — Z889 Allergy status to unspecified drugs, medicaments and biological substances status: Secondary | ICD-10-CM

## 2017-08-28 DIAGNOSIS — R740 Nonspecific elevation of levels of transaminase and lactic acid dehydrogenase [LDH]: Secondary | ICD-10-CM | POA: Diagnosis present

## 2017-08-28 DIAGNOSIS — F329 Major depressive disorder, single episode, unspecified: Secondary | ICD-10-CM | POA: Diagnosis present

## 2017-08-28 DIAGNOSIS — F419 Anxiety disorder, unspecified: Secondary | ICD-10-CM | POA: Diagnosis not present

## 2017-08-28 DIAGNOSIS — Z720 Tobacco use: Secondary | ICD-10-CM

## 2017-08-28 DIAGNOSIS — R7401 Elevation of levels of liver transaminase levels: Secondary | ICD-10-CM

## 2017-08-28 HISTORY — PX: LOOP RECORDER INSERTION: EP1214

## 2017-08-28 HISTORY — PX: TEE WITHOUT CARDIOVERSION: SHX5443

## 2017-08-28 LAB — CBC
HCT: 40.4 % (ref 36.0–46.0)
HEMOGLOBIN: 13.5 g/dL (ref 12.0–15.0)
MCH: 31.8 pg (ref 26.0–34.0)
MCHC: 33.4 g/dL (ref 30.0–36.0)
MCV: 95.1 fL (ref 78.0–100.0)
Platelets: 397 10*3/uL (ref 150–400)
RBC: 4.25 MIL/uL (ref 3.87–5.11)
RDW: 13.4 % (ref 11.5–15.5)
WBC: 10.8 10*3/uL — ABNORMAL HIGH (ref 4.0–10.5)

## 2017-08-28 LAB — BASIC METABOLIC PANEL
Anion gap: 8 (ref 5–15)
BUN: 10 mg/dL (ref 6–20)
CALCIUM: 8.7 mg/dL — AB (ref 8.9–10.3)
CHLORIDE: 105 mmol/L (ref 101–111)
CO2: 23 mmol/L (ref 22–32)
CREATININE: 0.44 mg/dL (ref 0.44–1.00)
Glucose, Bld: 98 mg/dL (ref 65–99)
Potassium: 3.7 mmol/L (ref 3.5–5.1)
SODIUM: 136 mmol/L (ref 135–145)

## 2017-08-28 LAB — PROTIME-INR
INR: 0.99
Prothrombin Time: 13 seconds (ref 11.4–15.2)

## 2017-08-28 LAB — HOMOCYSTEINE: HOMOCYSTEINE-NORM: 9.4 umol/L (ref 0.0–15.0)

## 2017-08-28 LAB — ANTI-DNA ANTIBODY, DOUBLE-STRANDED: DS DNA AB: 2 [IU]/mL (ref 0–9)

## 2017-08-28 SURGERY — LOOP RECORDER INSERTION

## 2017-08-28 SURGERY — ECHOCARDIOGRAM, TRANSESOPHAGEAL
Anesthesia: Monitor Anesthesia Care

## 2017-08-28 MED ORDER — LACTATED RINGERS IV SOLN
INTRAVENOUS | Status: DC | PRN
  Administered 2017-08-28: 12:00:00 via INTRAVENOUS

## 2017-08-28 MED ORDER — PROPOFOL 10 MG/ML IV BOLUS
INTRAVENOUS | Status: DC | PRN
  Administered 2017-08-28: 20 mg via INTRAVENOUS
  Administered 2017-08-28 (×2): 10 mg via INTRAVENOUS

## 2017-08-28 MED ORDER — ACETAMINOPHEN 650 MG RE SUPP: 650.0000 mg | RECTAL | Status: DC | PRN

## 2017-08-28 MED ORDER — ONDANSETRON HCL 4 MG/2ML IJ SOLN: 4.0000 mg | Freq: Four times a day (QID) | INTRAMUSCULAR | Status: DC | PRN

## 2017-08-28 MED ORDER — LIDOCAINE-EPINEPHRINE 1 %-1:100000 IJ SOLN
INTRAMUSCULAR | Status: DC | PRN
  Administered 2017-08-28: 30 mL

## 2017-08-28 MED ORDER — ACETAMINOPHEN 325 MG PO TABS
650.0000 mg | ORAL_TABLET | ORAL | Status: DC | PRN
  Administered 2017-08-28 – 2017-09-06 (×3): 650 mg via ORAL
  Filled 2017-08-28 (×3): qty 2

## 2017-08-28 MED ORDER — METOPROLOL TARTRATE 12.5 MG HALF TABLET
12.5000 mg | ORAL_TABLET | Freq: Two times a day (BID) | ORAL | Status: DC
  Administered 2017-08-28 – 2017-08-31 (×7): 12.5 mg via ORAL
  Filled 2017-08-28 (×7): qty 1

## 2017-08-28 MED ORDER — ATORVASTATIN CALCIUM 80 MG PO TABS
80.0000 mg | ORAL_TABLET | Freq: Every day | ORAL | Status: DC
  Administered 2017-08-29 – 2017-09-01 (×4): 80 mg via ORAL
  Filled 2017-08-28 (×4): qty 1

## 2017-08-28 MED ORDER — PROPOFOL 500 MG/50ML IV EMUL
INTRAVENOUS | Status: DC | PRN
  Administered 2017-08-28: 100 ug/kg/min via INTRAVENOUS

## 2017-08-28 MED ORDER — LORATADINE 10 MG PO TABS
10.0000 mg | ORAL_TABLET | Freq: Every day | ORAL | Status: DC
  Administered 2017-08-29 – 2017-09-02 (×5): 10 mg via ORAL
  Filled 2017-08-28 (×5): qty 1

## 2017-08-28 MED ORDER — ASPIRIN 325 MG PO TABS
325.0000 mg | ORAL_TABLET | Freq: Every day | ORAL | Status: DC
  Administered 2017-08-29 – 2017-09-01 (×4): 325 mg via ORAL
  Filled 2017-08-28 (×4): qty 1

## 2017-08-28 MED ORDER — ONDANSETRON HCL 4 MG PO TABS: 4.0000 mg | ORAL_TABLET | Freq: Four times a day (QID) | ORAL | Status: DC | PRN

## 2017-08-28 MED ORDER — LIDOCAINE-EPINEPHRINE 1 %-1:100000 IJ SOLN
INTRAMUSCULAR | Status: AC
  Filled 2017-08-28: qty 1

## 2017-08-28 MED ORDER — SORBITOL 70 % SOLN
30.0000 mL | Freq: Every day | Status: DC | PRN
  Administered 2017-09-08: 30 mL via ORAL
  Filled 2017-08-28: qty 30

## 2017-08-28 MED ORDER — BUTAMBEN-TETRACAINE-BENZOCAINE 2-2-14 % EX AERO
INHALATION_SPRAY | CUTANEOUS | Status: DC | PRN
  Administered 2017-08-28: 2 via TOPICAL

## 2017-08-28 MED ORDER — ONDANSETRON HCL 4 MG/2ML IJ SOLN
INTRAMUSCULAR | Status: DC | PRN
  Administered 2017-08-28: 4 mg via INTRAVENOUS

## 2017-08-28 MED ORDER — ACETAMINOPHEN 160 MG/5ML PO SOLN: 650.0000 mg | ORAL | Status: DC | PRN

## 2017-08-28 MED ORDER — NICOTINE 14 MG/24HR TD PT24: 14.0000 mg | MEDICATED_PATCH | Freq: Every day | TRANSDERMAL | Status: DC | PRN

## 2017-08-28 MED ORDER — BISACODYL 10 MG RE SUPP: 10.0000 mg | Freq: Every day | RECTAL | Status: DC | PRN

## 2017-08-28 SURGICAL SUPPLY — 2 items
LOOP REVEAL LINQSYS (Prosthesis & Implant Heart) ×2 IMPLANT
PACK LOOP INSERTION (CUSTOM PROCEDURE TRAY) ×2 IMPLANT

## 2017-08-28 NOTE — Interval H&P Note (Signed)
History and Physical Interval Note:  08/28/2017 12:15 PM  [name]  has presented today for surgery, with the diagnosis of STROKE  The various methods of treatment have been discussed with the patient and family. After consideration of risks, benefits and other options for treatment, the patient has consented to  Procedure(s): TRANSESOPHAGEAL ECHOCARDIOGRAM (TEE) (N/A) as a surgical intervention .  The patient's history has been reviewed, patient examined, no change in status, stable for surgery.  I have reviewed the patient's chart and labs.  Questions were answered to the patient's satisfaction.     [name]

## 2017-08-28 NOTE — Consult Note (Signed)
Physical Medicine and Rehabilitation Consult Reason for Consult: Decreased functional mobility Referring Physician: Dr. Leonie Man   HPI: [name] is a 56 y.o. right handed female with unremarkable past medical history except tobacco abuse on no prescription medications. Per chart review patient lives with spouse. Independent prior to admission. Presented 08/21/2017 to [facility] hospital with right-sided weakness and slurred speech. CT of the head showed small acute infarct left insula, left putamen and possibly left parietal cortex. Hyperdense distal left ICA and M1 segment consistent with thrombus. CT angiogram head and neck showed positive emergent large vessel occlusion. Severe atheromatous stenosis of the left ICA bulb felt to be embolic source. Patient did receive TPA. Patient transferred to [facility] underwent revascularization per interventional radiology. Latest MRI shows acute infarct left temporal operculum, left posterior insula, left posterior lentiform nuclear medicine extending into the corona radiata and caudate body. Biparietal white matter microhemorrhage felt to be chronic. Echocardiogram with ejection fraction of 60% no wall motion abnormalities. Lower extremity Dopplers negative for DVT. Neurology follow-up and await plan for possible need for TEE and loop recorder. Presently on aspirin for CVA prophylaxis. Physical therapy evaluation completed 08/24/2017 with recommendations of physical medicine rehabilitation consult.  Patient has hypophonia but is able to speak in short phrases  Review of Systems  Constitutional: Negative for chills and fever.  HENT: Negative for hearing loss.   Eyes: Negative for blurred vision and double vision.  Respiratory: Positive for cough. Negative for shortness of breath.   Cardiovascular: Negative for chest pain, palpitations and leg swelling.  Gastrointestinal: Positive for constipation. Negative for nausea and vomiting.    Genitourinary: Negative for dysuria, flank pain and hematuria.  Musculoskeletal: Positive for myalgias.  Skin: Negative for rash.  Neurological: Positive for speech change and focal weakness.  All other systems reviewed and are negative.  No past medical history on file. Past Surgical History:  Procedure Laterality Date  . IR ANGIO INTRA EXTRACRAN SEL COM CAROTID INNOMINATE UNI R MOD SED  08/21/2017  . IR ANGIO VERTEBRAL SEL SUBCLAVIAN INNOMINATE UNI R MOD SED  08/21/2017  . IR PERCUTANEOUS ART THROMBECTOMY/INFUSION INTRACRANIAL INC DIAG ANGIO  08/21/2017  . LOOP RECORDER INSERTION N/A 08/28/2017   Procedure: LOOP RECORDER INSERTION;  Surgeon: Constance Haw, MD;  Location: [place] [facility];  Service: Cardiovascular;  Laterality: N/A;  . RADIOLOGY WITH ANESTHESIA N/A 08/21/2017   Procedure: RADIOLOGY WITH ANESTHESIA;  Surgeon: Luanne Bras, MD;  Location: [place];  Service: Radiology;  Laterality: N/A;   Family History  Problem Relation Age of Onset  . Heart attack Father    Social History:  reports that she has been smoking.  she has never used smokeless tobacco. She reports that she does not use drugs. Her alcohol history is not on file. Allergies: No Known Allergies Medications Prior to Admission  Medication Sig Dispense Refill  . loratadine (CLARITIN) 10 MG tablet Take 10 mg by mouth daily.      Home:    Functional History:   Functional Status:  Mobility:          ADL:    Cognition:      There were no vitals taken for this visit. Physical Exam  Vitals reviewed. Constitutional: She appears well-developed.  HENT:  Right facial droop  Eyes:  Pupils reactive to light  Neck: Normal range of motion. Neck supple. No thyromegaly present.  Cardiovascular: Normal rate and regular rhythm.  Respiratory:  Limited inspiratory effort  but clear to auscultation  GI: Soft. Bowel sounds are normal. She exhibits no distension.  Neurological:  Lethargic but  arousable. Appears to have a bit of the field cut. Provides her name and age. Followed simple commands  Skin: Skin is warm and dry.  Right upper extremity 0/5 in the deltoid bicep tricep grip  Right lower extremity 0/5 in hip flexor knee extensor ankle dorsiflexor plantar flexor Left upper extremity 5/5 in the deltoid, bicep, tricep, grip Left lower extremity 5/5 in the hip flexor knee extensor ankle dorsiflexor Sensation reports intact to light touch, able to identify fingers and toes touch bilaterally. Visual fields are intact to confrontation testing Results for orders placed or performed during the hospital encounter of 08/21/17 (from the past 24 hour(s))  CBC     Status: Abnormal   Collection Time: 08/28/17  4:29 AM  Result Value Ref Range   WBC 10.8 (H) 4.0 - 10.5 K/uL   RBC 4.25 3.87 - 5.11 MIL/uL   Hemoglobin 13.5 12.0 - 15.0 g/dL   HCT 40.4 36.0 - 46.0 %   MCV 95.1 78.0 - 100.0 fL   MCH 31.8 26.0 - 34.0 pg   MCHC 33.4 30.0 - 36.0 g/dL   RDW 13.4 11.5 - 15.5 %   Platelets 397 150 - 400 K/uL  Basic metabolic panel     Status: Abnormal   Collection Time: 08/28/17  4:29 AM  Result Value Ref Range   Sodium 136 135 - 145 mmol/L   Potassium 3.7 3.5 - 5.1 mmol/L   Chloride 105 101 - 111 mmol/L   CO2 23 22 - 32 mmol/L   Glucose, Bld 98 65 - 99 mg/dL   BUN 10 6 - 20 mg/dL   Creatinine, Ser 0.44 0.44 - 1.00 mg/dL   Calcium 8.7 (L) 8.9 - 10.3 mg/dL   GFR calc non Af Amer >60 >60 mL/min   GFR calc Af Amer >60 >60 mL/min   Anion gap 8 5 - 15  Protime-INR     Status: None   Collection Time: 08/28/17  2:06 PM  Result Value Ref Range   Prothrombin Time 13.0 11.4 - 15.2 seconds   INR 0.99    No results found.  Assessment/Plan: Diagnosis: Left MCA distribution infarct from artery to artery embolism status post clot retrieval with residual right hemiplegia 1. Does the need for close, 24 hr/day medical supervision in concert with the patient's rehab needs make it unreasonable for  this patient to be served in a less intensive setting? Yes 2. Co-Morbidities requiring supervision/potential complications: Dysphagia, n.p.o., aspiration risk, tachycardia 3. Due to bladder management, bowel management, safety, skin/wound care, disease management, medication administration, pain management and patient education, does the patient require 24 hr/day rehab nursing? Yes 4. Does the patient require coordinated care of a physician, rehab nurse, PT (1-2 hrs/day, 5 days/week), OT (1-2 hrs/day, 5 days/week) and SLP (.5-1 hrs/day, 5 days/week) to address physical and functional deficits in the context of the above medical diagnosis(es)? Yes Addressing deficits in the following areas: balance, endurance, locomotion, strength, transferring, bowel/bladder control, bathing, dressing, feeding, grooming, toileting, cognition, speech, language, swallowing and psychosocial support 5. Can the patient actively participate in an intensive therapy program of at least 3 hrs of therapy per day at least 5 days per week? Yes 6. The potential for patient to make measurable gains while on inpatient rehab is excellent 7. Anticipated functional outcomes upon discharge from inpatient rehab are supervision  with PT, supervision and min  assist with OT, supervision and min assist with SLP. 8. Estimated rehab length of stay to reach the above functional goals is: 17-22d 9. Anticipated D/C setting: Home 10. Anticipated post D/C treatments: [facility] [place] therapy 11. Overall Rehab/Functional Prognosis: excellent and good  RECOMMENDATIONS: This patient's condition is appropriate for continued rehabilitative care in the following setting: CIR Patient has agreed to participate in recommended program. Yes Note that insurance prior authorization may be required for reimbursement for recommended care.  Comment: Needs blood pressure and heart rate managed without IV medications prior to rehab  Charlett Blake M.D. [facility] FAAPM&R (Sports Med, Neuromuscular Med) Diplomate Am Board of Electrodiagnostic Med  Retta Diones, RN 08/28/2017

## 2017-08-28 NOTE — CV Procedure (Signed)
TEE  LA, LAA without masses   No PFO nby color doppler or with injection of agitated sline  TV normal  Tr TR AV mildly thickened  Triv AI PV normal MV normal  At least moderate MR LVEF appears depressed  Mod to large amount of fixed atherosclerotic plaquing in aorta  Procedure without complcation Full report to follow

## 2017-08-28 NOTE — Anesthesia Preprocedure Evaluation (Signed)
Anesthesia Evaluation  Patient identified by MRN, date of birth, ID band Patient unresponsive    Reviewed: Unable to perform ROS - Chart review onlyPreop documentation limited or incomplete due to emergent nature of procedure.  Airway Mallampati: II  TM Distance: >3 FB Neck ROM: Full    Dental  (+) Teeth Intact   Pulmonary Current Smoker,    Pulmonary exam normal        Cardiovascular Normal cardiovascular exam Rhythm:Regular     Neuro/Psych CVA, Residual Symptoms    GI/Hepatic   Endo/Other    Renal/GU      Musculoskeletal   Abdominal   Peds  Hematology   Anesthesia Other Findings   Reproductive/Obstetrics                             Anesthesia Physical  Anesthesia Plan  ASA: III  Anesthesia Plan: MAC   Post-op Pain Management:    Induction:   PONV Risk Score and Plan: 3 and Ondansetron and Treatment may vary due to age or medical condition  Airway Management Planned:   Additional Equipment:   Intra-op Plan:   Post-operative Plan:   Informed Consent: I have reviewed the patients History and Physical, chart, labs and discussed the procedure including the risks, benefits and alternatives for the proposed anesthesia with the patient or authorized representative who has indicated his/her understanding and acceptance.   History available from chart only  Plan Discussed with: CRNA  Anesthesia Plan Comments:         Anesthesia Quick Evaluation

## 2017-08-28 NOTE — Anesthesia Procedure Notes (Signed)
Procedure Name: MAC Date/Time: 08/28/2017 12:17 PM Performed by: Wilburn Cornelia, CRNA Pre-anesthesia Checklist: Patient identified, Emergency Drugs available, Suction available, Patient being monitored and Timeout performed Patient Re-evaluated:Patient Re-evaluated prior to induction Oxygen Delivery Method: Nasal cannula Placement Confirmation: CO2 detector,  positive ETCO2 and breath sounds checked- equal and bilateral Dental Injury: Teeth and Oropharynx as per pre-operative assessment

## 2017-08-28 NOTE — Progress Notes (Signed)
OT Cancellation    08/28/17 1300  OT Visit Information  Last OT Received On 08/28/17  Reason Eval/Treat Not Completed Patient at procedure or test/ unavailable (cath lab)  [facility], OT/L  [phone] 08/28/2017

## 2017-08-28 NOTE — Discharge Summary (Signed)
Stroke Discharge Summary  Patient ID: [name]   MRN: [id number]      DOB: 09-05-1961  Date of Admission: 08/21/2017 Date of Discharge: 08/28/2017  Attending Physician:  Rosalin Hawking, MD, Stroke MD Consultant(s):  Treatment Team:  Stroke, Md, MD Lbcardiology, Rounding, MD pulmonary/intensive care, rehabilitation medicine and Interventional Radiology Patient's PCP:  Patient, No Pcp Per  Discharge Diagnoses:  Acute embolic stroke [facility]) Left ICA thrombosis s/p mechanical thrombectomy achieving TICI 3 reperfusion  Active Problems: Hypertension Hyperlipidemia Tobacco Abuse  History reviewed. No pertinent past medical history. Past Surgical History:  Procedure Laterality Date  . IR ANGIO INTRA EXTRACRAN SEL COM CAROTID INNOMINATE UNI R MOD SED  08/21/2017  . IR ANGIO VERTEBRAL SEL SUBCLAVIAN INNOMINATE UNI R MOD SED  08/21/2017  . IR PERCUTANEOUS ART THROMBECTOMY/INFUSION INTRACRANIAL INC DIAG ANGIO  08/21/2017  . LOOP RECORDER INSERTION N/A 08/28/2017   Procedure: LOOP RECORDER INSERTION;  Surgeon: Constance Haw, MD;  Location: [place] [facility];  Service: Cardiovascular;  Laterality: N/A;  . RADIOLOGY WITH ANESTHESIA N/A 08/21/2017   Procedure: RADIOLOGY WITH ANESTHESIA;  Surgeon: Luanne Bras, MD;  Location: [place];  Service: Radiology;  Laterality: N/A;    Medications to be continued on Rehab . aspirin  325 mg Oral Daily  . atorvastatin  80 mg Oral q1800  . chlorhexidine gluconate (MEDLINE KIT)  15 mL Mouth Rinse BID  . loratadine  10 mg Oral Daily  . mouth rinse  15 mL Mouth Rinse QID  . metoprolol tartrate  12.5 mg Oral BID    LABORATORY STUDIES CBC    Component Value Date/Time   WBC 10.8 (H) 08/28/2017 0429   RBC 4.25 08/28/2017 0429   HGB 13.5 08/28/2017 0429   HCT 40.4 08/28/2017 0429   PLT 397 08/28/2017 0429   MCV 95.1 08/28/2017 0429   MCH 31.8 08/28/2017 0429   MCHC 33.4 08/28/2017 0429   RDW 13.4 08/28/2017 0429   LYMPHSABS 1.9  08/24/2017 0556   MONOABS 1.5 (H) 08/24/2017 0556   EOSABS 0.2 08/24/2017 0556   BASOSABS 0.0 08/24/2017 0556   CMP    Component Value Date/Time   NA 136 08/28/2017 0429   K 3.7 08/28/2017 0429   CL 105 08/28/2017 0429   CO2 23 08/28/2017 0429   GLUCOSE 98 08/28/2017 0429   BUN 10 08/28/2017 0429   CREATININE 0.44 08/28/2017 0429   CALCIUM 8.7 (L) 08/28/2017 0429   PROT 8.3 (H) 08/21/2017 1829   ALBUMIN 4.5 08/21/2017 1829   AST 32 08/21/2017 1829   ALT 27 08/21/2017 1829   ALKPHOS 73 08/21/2017 1829   BILITOT 0.4 08/21/2017 1829   GFRNONAA >60 08/28/2017 0429   GFRAA >60 08/28/2017 0429   COAGS Lab Results  Component Value Date   INR 0.85 08/21/2017   Lipid Panel    Component Value Date/Time   CHOL 237 (H) 08/22/2017 0811   TRIG 295 (H) 08/22/2017 0811   TRIG 301 (H) 08/22/2017 0811   HDL 35 (L) 08/22/2017 0811   CHOLHDL 6.8 08/22/2017 0811   VLDL 59 (H) 08/22/2017 0811   LDLCALC 143 (H) 08/22/2017 0811   HgbA1C  Lab Results  Component Value Date   HGBA1C 5.5 08/22/2017   Urinalysis    Component Value Date/Time   COLORURINE YELLOW 08/22/2017 [place] 08/22/2017 1526   LABSPEC 1.014 08/22/2017 1526   PHURINE 5.0 08/22/2017 1526   GLUCOSEU NEGATIVE 08/22/2017 1526   HGBUR  NEGATIVE 08/22/2017 1526   BILIRUBINUR NEGATIVE 08/22/2017 1526   KETONESUR NEGATIVE 08/22/2017 1526   PROTEINUR NEGATIVE 08/22/2017 1526   NITRITE NEGATIVE 08/22/2017 1526   LEUKOCYTESUR NEGATIVE 08/22/2017 1526   Urine Drug Screen     Component Value Date/Time   LABOPIA NONE DETECTED 08/22/2017 1516   COCAINSCRNUR NONE DETECTED 08/22/2017 1516   LABBENZ POSITIVE (A) 08/22/2017 1516   AMPHETMU NONE DETECTED 08/22/2017 1516   THCU NONE DETECTED 08/22/2017 1516   LABBARB NONE DETECTED 08/22/2017 1516    Alcohol Level    Component Value Date/Time   ETH <10 08/21/2017 1829   SIGNIFICANT DIAGNOSTIC STUDIES I have personally reviewed the radiological images  below and agree with the radiology interpretations.  Ct Angio Head & Neck W Or Wo Contrast Result Date: 08/21/2017 IMPRESSION: 1. Positive for emergent large vessel occlusion. No flow seen in the left M1 and distal intracranial LICA. There is flow seen in the left M2/3 branches suggesting favorable collaterals. Aspects scored on prior noncontrast head CT. 2. Severe atheromatous stenosis at the left ICA bulb which may be an embolic source. There is comparatively poor flow between the stenosis and intracranial occlusion. 3. ~ 40% right ICA bulb atheromatous narrowing. 4. 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 There is good flow in the distal left V4 segment and left PICA, which may be retrograde. 5. Advanced atherosclerosis of the aortic arch with irregular plaque/thrombus at the isthmus. Motion affects visualization of the great vessel ostia.   Ct Head Wo Contrast Result Date: 08/21/2017 IMPRESSION: Normal head CT. No intraparenchymal hemorrhage, contrast staining or cytotoxic edema.   Ct Head Code Stroke Wo Contrast Result Date: 08/21/2017 IMPRESSION: Hyperdense distal left ICA and M1 segment consistent with thrombus in this setting. Small acute infarcts seen in the left insula, left putamen, and possibly left parietal cortex ASPECTS is 7 to 8.   Echocardiogram: 08/22/17 - Left ventricle: The cavity size was normal. Wall thickness was   increased in a pattern of mild LVH. Systolic function was normal.   The estimated ejection fraction was in the range of 55% to 60%.   Wall motion was normal; there were no regional wall motion   abnormalities. The study is not technically sufficient to allow   evaluation of LV diastolic function. - Mitral valve: Mildly thickened leaflets . There was mild   regurgitation. - Left atrium: Moderately dilated. - Right atrium: Moderately dilated. - Inferior vena cava: The vessel was dilated. The respirophasic   diameter  changes were blunted (< 50%), consistent with elevated   central venous pressure. On vent. Impressions: - LVEF 55-60%, mild LVH, normal wall motion, mild MR, moderate   biatrial enlargment, dilated IVC on vent.                                B/L LE Duplex:  Negative for DVT  MRI Brain:                                                                Acute infarction involving left temporal operculum, left posterior insula, left posterior lentiform nucleus, and extending into corona radiata and caudate body, approximately 13 cc. Additional small foci  of acute infarction are present in left posterior temporal, left frontal, left parietal, and left occipital Lobes.  TEE LA, LAA without masses   No PFO nby color doppler or with injection of agitated sline  TV normal  Tr TR AV mildly thickened  Triv AI PV normal MV normal  At least moderate MR LVEF appears depressed  Mod to large amount of fixed atherosclerotic plaquing in aorta     HISTORY [place] COU[facility]SE Ms. JERLYN PAIN is a 57 y.o. female with PMH of no significant past medical history (has not been to a physician in 15 years) who presents with right-sided weakness that started sometime between 3:15 and 5:15 PM.Her husband left her around 3:15 PM and then her brother found her on the floor at 5:15 PM. [facility] was taken to [facility] where she was administered IV TPA. CTA was performed which demonstrated left distal ICA/MCA occlusion. She was within the 6-hour window for IR intervention, and was taken directly to the Cath Lab.S/P bilateral common carotid and RT vert arteriograms,followed by complete revascularization of occluded Lt ICA terminus ,Lt MCA M! 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  Suspected  Etiology: Likely cardioembolic source  Resultant Symptoms: aphasia, right sided weakness and left gaze preference Stroke Risk Factors: hyperlipidemia, hypertension and no medical care for greater than 15 years Other Stroke Risk Factors: Advanced age, Cigarette smoker,   Outstanding Stroke Work-up Studies:     Work-up complete       PLAN  08/28/2017: Continue ASA  325 mg and Atorvastatin 80 mg Continue metoprolol 12.5 twice daily for resting tachycardia  Continue PT/OT/SLP   Transfer to inpatient rehabilitation after TEE/loop   Leukocytosis- Resolved Remains afebrile U/A negative CXR stable with mild left lung base atelectasis.   R/O AFIB: TEE and Loop Recorder Placement  08/28/17  F/U with Cardiology as necessary    DYSPHAGIA: Dysphagia 3 diet. ST following closely  HYPERTENSION EMERGENCY: Stable Metoprolol 12.5 mg added Home Meds: NONE  HYPERLIPIDEMIA: Labs(Brief)          Component Value Date/Time   CHOL 237 (H) 08/22/2017 0811   TRIG 295 (H) 08/22/2017 0811   TRIG 301 (H) 08/22/2017 0811   HDL 35 (L) 08/22/2017 0811   CHOLHDL 6.8 08/22/2017 0811   VLDL 59 (H) 08/22/2017 0811   LDLCALC 143 (H) 08/22/2017 0811    Home Meds:  NONE LDL  goal < 70 Started on Lipitor to 80 mg daily Continue statin at discharge  R/O DIABETES: RecentLabs       Lab Results  Component Value Date   HGBA1C 5.5 08/22/2017    HgbA1c goal < 7.0   TOBACCO ABUSE Current smoker Smoking cessation counseling will be provided, once extubated Nicotine patch provided PRN  Other Active Problems: Active Problems:   Acute embolic stroke (HCC)   Endotracheally intubated   Acute respiratory insufficiency - requiring mechanical ventilation, Management per CCM  DISCHARGE EXAM Blood pressure 134/62, pulse 79, temperature 98.8 F (37.1 C), temperature source Oral, resp. rate 18, height 5' 2" (1.575 m), weight 60.8 kg (134 lb), SpO2 95 %. General - obese middle-age Caucasian  lady who is   in no apparent distress but has resting tachycardia Respiratory - Lungs clear bilaterally. No wheezing. Cardiovascular -  Regular rate and rhythm   Neuro: Mental Status: Patient is awake aphasic,speaks  short sentences only and will follow all commands Cranial Nerves: XI:PJAS not blink to threat from the right.Pupils are equal, round, and reactive to light.  III,IV, NK:NLZJ gaze preference improved V: Facial sensation appears symmetric  VII: Facial movementis weak on the right Motor: She has a flaccid right hemiparesis, with no movement in the right arm and minimal movement of leg.She moves her left side voluntarily with full strength. Sensory: She does flinch to noxious stimuli, but this is reduced on the right side. Cerebellar: not tested Gait deferred  VTE prophylaxis: SCD's  Diet : DIET DYS 3 Room service appropriate? Yes with Assist; Fluid consistency: Thin    DISCHARGE PLAN Prior Home Stroke Medications: No antithrombotic Prior to Admission Hospital Current Stroke Medications: NONE, at this time Stroke New Meds Plan: Now on aspirin Discharge Stroke Meds: Please discharge patient on Aspirin and Lipitor 80 mg  Disposition: CIR Therapy Recs: CIR   Disposition:  Transfer to [place] for ongoing PT, OT and ST  aspirin 325 mg daily and Lipitor 80 mg for secondary stroke prevention.  Recommend ongoing risk factor control by Primary Care Physician at time of discharge from inpatient rehabilitation.  Follow-up Patient, No Pcp Per in 2 weeks following discharge from rehab.- Case Management aware of need for PCP .  Follow-up Information    Garvin Fila, MD. Schedule an appointment as soon as possible for a visit in 6 week(s).   Specialties:  Neurology,  Contact information: [address] [place] [address] [place], [facility] Neurology Stroke Team  Attending note: I  reviewed above note and agree with the assessment and plan. I have made any additions or clarifications directly to the above note. Pt was seen and examined.   56 year old female with history of tobacco abuse admitted for right sided weakness status post TPA.  CT head and neck showed left ICA/MCA occlusion, left ICA bulb thrombosis.  Underwent mechanical thrombectomy achieving TICI3 reperfusion.  MRI brain showed left MCA scattered infarcts.  Negative DVT, EF 55-60%.  LDL 143, A1c 5.5.  Hypercoagulable and autoimmune workup so far negative, some are still pending.  TEE unremarkable, loop recorder placed.  Patient still has right-sided hemiplegia.  On aspirin and Lipitor.  Educated on smoking cessation.  PT OT recommended CIR, transfer to CIR today for rehabilitation.  Patient will follow up with Dr. Leonie Man at [facility] in about 6 weeks.  Greater than 30 minutes were spent preparing discharge.  Rosalin Hawking, MD PhD Stroke Neurology 08/28/2017 4:41 PM

## 2017-08-28 NOTE — H&P (Signed)
Physical Medicine and Rehabilitation Admission H&P    Chief Complaint  Patient presents with  . Code Stroke  : HPI: [name] is a 56 y.o. right handed female with unremarkable past medical history except tobacco abuse on no prescription medications. Per chart review and family, patient lives with spouse. Independent prior to admission. Presented 08/21/2017 to [facility] hospital with right-sided weakness and slurred speech. CT of the head reviewed, showing left CVA.  Per report, small acute infarct left insula, left putamen and possibly left parietal cortex. Hyperdense distal left ICA and M1 segment consistent with thrombus. CT angiogram head and neck showed positive emergent large vessel occlusion. Severe atheromatous stenosis of the left ICA bulb felt to be embolic source. Patient did receive TPA. Patient transferred to [facility] underwent revascularization per interventional radiology. Latest MRI shows acute infarct left temporal operculum, left posterior insula, left posterior lentiform nuclear medicine extending into the corona radiata and caudate body. Biparietal white matter microhemorrhage felt to be chronic. Echocardiogram with ejection fraction of 60% no wall motion abnormalities. Lower extremity Dopplers negative for DVT. Neurology follow-up with TEE that showed LAA without masses no PFO and loop recorder placed 08/28/2017. Presently on aspirin for CVA prophylaxis. Dysphagia #3 thin liquid diet. Physical and occupational therapy evaluations completed 08/24/2017 with recommendations of physical medicine rehabilitation consult. Patient was admitted for a comprehensive rehabilitation program  Review of Systems  Constitutional: Negative for chills and fever.  HENT: Negative for hearing loss.   Eyes: Negative for blurred vision and double vision.  Respiratory: Positive for cough. Negative for shortness of breath.   Cardiovascular: Negative for chest pain and palpitations.    Gastrointestinal: Positive for constipation. Negative for nausea.  Genitourinary: Negative for dysuria, flank pain and hematuria.  Musculoskeletal: Positive for myalgias.  Skin: Negative for rash.  Neurological: Positive for sensory change, speech change and focal weakness. Negative for seizures.  All other systems reviewed and are negative.  History reviewed. No pertinent past medical history. Past Surgical History:  Procedure Laterality Date  . IR ANGIO INTRA EXTRACRAN SEL COM CAROTID INNOMINATE UNI R MOD SED  08/21/2017  . IR ANGIO VERTEBRAL SEL SUBCLAVIAN INNOMINATE UNI R MOD SED  08/21/2017  . IR PERCUTANEOUS ART THROMBECTOMY/INFUSION INTRACRANIAL INC DIAG ANGIO  08/21/2017  . RADIOLOGY WITH ANESTHESIA N/A 08/21/2017   Procedure: RADIOLOGY WITH ANESTHESIA;  Surgeon: Luanne Bras, MD;  Location: [place];  Service: Radiology;  Laterality: N/A;   History reviewed. No pertinent family history. Social History:  reports that she has been smoking.  she has never used smokeless tobacco. She reports that she does not use drugs. Her alcohol history is not on file. Allergies: No Known Allergies Medications Prior to Admission  Medication Sig Dispense Refill  . loratadine (CLARITIN) 10 MG tablet Take 10 mg by mouth daily.      Drug Regimen Review Drug regimen was reviewed and remains appropriate with no significant issues identified  Home: Home Living Family/patient expects to be discharged to:: Private residence Living Arrangements: Spouse/significant other Available Help at Discharge: Family, Available 24 hours/day Type of Home: House Home Access: Stairs to enter [employer] of Steps: 3 Entrance Stairs-Rails: None Home Layout: One level Bathroom Shower/Tub: Tub/shower unit, Multimedia programmer: Standard Bathroom Accessibility: Yes Home Equipment: Environmental consultant - 2 wheels, Shower seat, Crutches   Functional History: Prior Function Level of Independence:  Independent Comments: drove; 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; 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  Functional  Status:  Mobility: Bed Mobility Overal bed mobility: Needs Assistance Bed Mobility: Supine to Sit Supine to sit: Mod assist General bed mobility comments: assist to move right side of her body but able to initiate transfer Transfers Overall transfer level: Needs assistance Transfers: Sit to/from Stand, Stand Pivot Transfers Sit to Stand: Max assist, +2 physical assistance Stand pivot transfers: Max assist, +2 physical assistance General transfer comment: pt with right knee blocked throughout with physical assist for weight shifting, stepping and preventing RLE buckling. Stood x 2 and pivoted to left bed to chair. Mod assist to scoot to EOB and back in chair with reciprocal cueing Ambulation/Gait General Gait Details: unable    ADL: ADL Overall ADL's : Needs assistance/impaired Eating/Feeding: NPO Grooming: Moderate assistance, Sitting Upper Body Bathing: Moderate assistance, Sitting Lower Body Bathing: Maximal assistance, Sit to/from stand Upper Body Dressing : Moderate assistance, Sitting Lower Body Dressing: Maximal assistance, Sit to/from stand Lower Body Dressing Details (indicate cue type and reason): Pt able to assist with donning L sock and reaching behind to pull up panties Toileting- Clothing Manipulation and Hygiene: Maximal assistance Functional mobility during ADLs: +2 for physical assistance, Maximal assistance(able to step with LLE; total A to move RLE; knee buckling)  Cognition: Cognition Overall Cognitive Status: Impaired/Different from baseline Arousal/Alertness: Awake/alert Orientation Level: Oriented to person, Oriented to place Attention: Sustained Sustained Attention: Impaired Sustained Attention Impairment: Verbal basic Problem Solving: Appears intact(for basic self care task) Comments: to be assessed further, aphasia impacting  assessment Cognition Arousal/Alertness: Awake/alert Behavior During Therapy: Flat affect, Impulsive Overall Cognitive Status: Impaired/Different from baseline Area of Impairment: Attention, Memory, Following commands, Safety/judgement, Awareness, Problem solving, Orientation Orientation Level: Disoriented to, Place, Time, Situation Current Attention Level: Sustained Following Commands: Follows one step commands with increased time Safety/Judgement: Decreased awareness of safety, Decreased awareness of deficits Awareness: Emergent Problem Solving: Slow processing, Difficulty sequencing, Requires verbal cues, Requires tactile cues General Comments: Rt inattention unable to use comb/toothbrush  Physical Exam: Blood pressure (!) 151/71, pulse (!) 105, temperature 98.3 F (36.8 C), temperature source Oral, resp. rate (!) 22, height 5' 2"  (1.575 m), weight 60.8 kg (134 lb), SpO2 (!) 87 %. Physical Exam  Vitals reviewed. Constitutional: She appears well-developed and well-nourished.  HENT:  Head: Normocephalic and atraumatic.  Eyes: EOM are normal. Right eye exhibits no discharge. Left eye exhibits no discharge.  Neck: Normal range of motion. Neck supple.  Cardiovascular: Normal rate and regular rhythm.  Respiratory: Effort normal and breath sounds normal.  GI: Soft. Bowel sounds are normal.  Musculoskeletal: She exhibits no edema or tenderness.  Neurological: She is alert.  Right facial droop  A&Ox2 with confusion Word finding difficulty Motor: Right upper extremity 0/5 proximal to distal Right lower extremity 0/5 proximal to distal Left upper extremity 5/5 in the deltoid, bicep, tricep, grip Left lower extremity 5/5 in the hip flexor knee extensor ankle dorsiflexor Sensation diminished to light touch RLE  Skin: Skin is warm and dry.  Psychiatric: Her affect is blunt. Her speech is delayed. She is slowed. Cognition and memory are impaired.    Results for orders placed or performed  during the hospital encounter of 08/21/17 (from the past 48 hour(s))  Glucose, capillary     Status: Abnormal   Collection Time: 08/23/17  8:06 AM  Result Value Ref Range   Glucose-Capillary 129 (H) 65 - 99 mg/dL   Comment 1 Notify RN    Comment 2 Document in Chart   Glucose, capillary  Status: Abnormal   Collection Time: 08/23/17 11:46 AM  Result Value Ref Range   Glucose-Capillary 102 (H) 65 - 99 mg/dL   Comment 1 Notify RN    Comment 2 Document in Chart   Glucose, capillary     Status: Abnormal   Collection Time: 08/23/17  4:23 PM  Result Value Ref Range   Glucose-Capillary 105 (H) 65 - 99 mg/dL   Comment 1 Notify RN    Comment 2 Document in Chart   Glucose, capillary     Status: None   Collection Time: 08/23/17  7:45 PM  Result Value Ref Range   Glucose-Capillary 98 65 - 99 mg/dL  Glucose, capillary     Status: None   Collection Time: 08/23/17 11:35 PM  Result Value Ref Range   Glucose-Capillary 85 65 - 99 mg/dL  Glucose, capillary     Status: None   Collection Time: 08/24/17  3:22 AM  Result Value Ref Range   Glucose-Capillary 86 65 - 99 mg/dL  CBC with Differential/Platelet     Status: Abnormal   Collection Time: 08/24/17  5:56 AM  Result Value Ref Range   WBC 14.8 (H) 4.0 - 10.5 K/uL   RBC 3.90 3.87 - 5.11 MIL/uL   Hemoglobin 12.1 12.0 - 15.0 g/dL   HCT 38.1 36.0 - 46.0 %   MCV 97.7 78.0 - 100.0 fL   MCH 31.0 26.0 - 34.0 pg   MCHC 31.8 30.0 - 36.0 g/dL   RDW 14.2 11.5 - 15.5 %   Platelets 304 150 - 400 K/uL   Neutrophils Relative % 76 %   Neutro Abs 11.1 (H) 1.7 - 7.7 K/uL   Lymphocytes Relative 13 %   Lymphs Abs 1.9 0.7 - 4.0 K/uL   Monocytes Relative 10 %   Monocytes Absolute 1.5 (H) 0.1 - 1.0 K/uL   Eosinophils Relative 1 %   Eosinophils Absolute 0.2 0.0 - 0.7 K/uL   Basophils Relative 0 %   Basophils Absolute 0.0 0.0 - 0.1 K/uL  Basic metabolic panel     Status: Abnormal   Collection Time: 08/24/17  5:56 AM  Result Value Ref Range   Sodium 142 135  - 145 mmol/L   Potassium 3.1 (L) 3.5 - 5.1 mmol/L   Chloride 112 (H) 101 - 111 mmol/L   CO2 25 22 - 32 mmol/L   Glucose, Bld 86 65 - 99 mg/dL   BUN 9 6 - 20 mg/dL   Creatinine, Ser 0.44 0.44 - 1.00 mg/dL   Calcium 8.4 (L) 8.9 - 10.3 mg/dL   GFR calc non Af Amer >60 >60 mL/min   GFR calc Af Amer >60 >60 mL/min    Comment: (NOTE) The eGFR has been calculated using the CKD EPI equation. This calculation has not been validated in all clinical situations. eGFR's persistently <60 mL/min signify possible Chronic Kidney Disease.    Anion gap 5 5 - 15  Glucose, capillary     Status: None   Collection Time: 08/24/17  7:30 PM  Result Value Ref Range   Glucose-Capillary 71 65 - 99 mg/dL  Glucose, capillary     Status: None   Collection Time: 08/24/17 11:41 PM  Result Value Ref Range   Glucose-Capillary 73 65 - 99 mg/dL  Glucose, capillary     Status: None   Collection Time: 08/25/17  3:15 AM  Result Value Ref Range   Glucose-Capillary 75 65 - 99 mg/dL   Dg Chest [facility] 1 View  Result  Date: 08/24/2017 CLINICAL DATA:  Pneumonia EXAM: PORTABLE CHEST 1 VIEW COMPARISON:  08/22/2017 FINDINGS: Endotracheal and NG tubes removed. Subsegmental atelectasis at the left base increased. Dense opacity projecting over the right hilum extending into the lower lobes has increased. No pneumothorax. Cardiomegaly. IMPRESSION: Extubated Increased left basilar atelectasis. Right hilar prominence and increasing opacity towards the right lung base have developed. An inflammatory process is suspected. Electronically Signed   By: Marybelle Killings M.D.   On: 08/24/2017 08:18       Medical Problem List and Plan: 1.  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 Status post loop recorder 2.  DVT Prophylaxis/Anticoagulation: SCDs. Bilateral lower extremity Dopplers negative 3. Pain Management: Tylenol as needed 4. Mood: Provide emotional support 5.  Neuropsych: This patient is capable of making decisions on her own behalf. 6. Skin/Wound Care: Routine skin checks 7. Fluids/Electrolytes/Nutrition: Routine I&O's with follow-up chemistries 8. Tobacco abuse. Counseling/NicoDerm patch 9. Dysphagia. Follow-up speech therapy. 10. Hyperlipidemia. Lipitor 11. Hypertension. Lopressor 12.5 mg twice a day. Monitor with increased mobility   Post Admission Physician Evaluation: 1. Preadmission assessment reviewed and changes made below. 2. Functional deficits secondary  to acute left MCA infarct status post TPA and thrombectomy. 3. Patient is admitted to receive collaborative, interdisciplinary care between the physiatrist, rehab nursing staff, and therapy team. 4. Patient's level of medical complexity and substantial therapy needs in context of that medical necessity cannot be provided at a lesser intensity of care such as a SNF. 5. Patient has experienced substantial functional loss from his/her baseline which was documented above under the "Functional History" and "Functional Status" headings.  Judging by the patient's diagnosis, physical exam, and functional history, the patient has potential for functional progress which will result in measurable gains while on inpatient rehab.  These gains will be of substantial and practical use upon discharge  in facilitating mobility and self-care at the household level. 47. Physiatrist will provide 24 hour management of medical needs as well as oversight of the therapy plan/treatment and provide guidance as appropriate regarding the interaction of the two. 7. 24 hour rehab nursing will assist with bladder management, safety, disease management, medication administration and patient education  and help integrate therapy concepts, techniques,education, etc. 8. PT will assess and treat for/with: Lower extremity strength, range of motion, stamina, balance, functional mobility, safety, adaptive techniques and equipment,  coping skills, pain control, stroke education.   Goals are: Min A. 9. OT will assess and treat for/with: ADL's, functional mobility, safety, upper extremity strength, adaptive techniques and equipment, ego support, and community reintegration.   Goals are: Min A. Therapy may proceed with showering this patient. 10. SLP will assess and treat for/with: speech, swallowing, cognition.  Goals are: Supervision. 11. Case Management and Social Worker will assess and treat for psychological issues and discharge planning. 12. Team conference will be held weekly to assess progress toward goals and to determine barriers to discharge. 13. Patient will receive at least 3 hours of therapy per day at least 5 days per week. 14. ELOS: 18-22 days.       15. Prognosis:  good  Delice Lesch, MD, ABPMR Lavon Paganini Angiulli, PA-C 08/25/2017

## 2017-08-28 NOTE — Progress Notes (Signed)
PT Cancellation Note  Patient Details Name: KEYERRA LAMERE MRN: [id number] DOB: 1960-10-16   Cancelled Treatment:    Reason Eval/Treat Not Completed: Patient at procedure or test/unavailable. At cath lab. PT to return as able.   Brookley Spitler M Allexis Bordenave 08/28/2017, 1:50 PM   Kittie Plater, PT, DPT Pager #: [phone] Office #: [phone]

## 2017-08-28 NOTE — Discharge Instructions (Signed)
LOOP IMPLANT SITE CARE Keep incision clean and dry for 3 days. You can remove outer dressing tomorrow. Leave steri-strips (little pieces of tape) on until seen in the office for wound check appointment. Call the office [phone]) for redness, drainage, swelling, or fever.

## 2017-08-28 NOTE — Care Management Note (Signed)
Case Management Note  Patient Details  Name: [name] MRN: [id number] Date of Birth: 02/09/1961  Subjective/Objective:                    Action/Plan: Pt discharging to CIR. No further needs per CM.  Expected Discharge Date:  08/28/17               Expected Discharge Plan:  [place]  In-House Referral:     Discharge planning Services  CM Consult  Post Acute Care Choice:    Choice offered to:     DME Arranged:    DME Agency:     HH Arranged:    HH Agency:     Status of Service:  Completed, signed off  If discussed at [employer], dates discussed:    Additional Comments:  Pollie Friar, RN 08/28/2017, 3:21 PM

## 2017-08-28 NOTE — Progress Notes (Signed)
SLP Cancellation Note  Patient Details Name: [name] MRN: [id number] DOB: 1961-04-12   Cancelled treatment:       Reason Eval/Treat Not Completed: Patient at procedure or test/unavailable(TEE. ) [name], [place] [phone]   [name] 08/28/2017, 12:17 PM

## 2017-08-28 NOTE — Interval H&P Note (Signed)
History and Physical Interval Note:  08/28/2017 10:41 AM  [name]  has presented today for surgery, with the diagnosis of STROKE  The various methods of treatment have been discussed with the patient and family. After consideration of risks, benefits and other options for treatment, the patient has consented to  Procedure(s): TRANSESOPHAGEAL ECHOCARDIOGRAM (TEE) (N/A) as a surgical intervention .  The patient's history has been reviewed, patient examined, no change in status, stable for surgery.  I have reviewed the patient's chart and labs.  Questions were answered to the patient's satisfaction.     [name]

## 2017-08-28 NOTE — IPOC Note (Signed)
Overall Plan of Care [facility]) Patient Details Name: TORRE PIKUS MRN: [id number] DOB: 05-06-1961  Admitting Diagnosis: Left MCA infact  Hospital Problems: Active Problems:   Left middle cerebral artery stroke ([place])   Transaminitis     Functional Problem List: Nursing Behavior, Bladder, Bowel, Endurance, Medication Management, Nutrition, Pain, Safety, Perception, Skin Integrity, Sensory, Edema, Motor  PT Behavior, Balance, Endurance, Motor, Perception, Safety, Sensory  OT Balance, Cognition, Motor, Safety  SLP Linguistic, Nutrition, Cognition, Safety  TR         Basic ADL's: OT Eating, Grooming, Toileting     Advanced  ADL's: OT Simple Meal Preparation     Transfers: PT Bed Mobility, Bed to Chair, Car, Furniture, Floor  OT Tub/Shower, Agricultural engineer: PT Stairs, Emergency planning/management officer, Ambulation     Additional Impairments: OT Fuctional Use of Upper Extremity  SLP Swallowing, Communication, Social Cognition expression Awareness  TR      Anticipated Outcomes Item Anticipated Outcome  Self Feeding modified independence  Swallowing  mod I with least restrictive diet    Basic self-care  min assist to supervision  Toileting  min assist to supervision   Bathroom Transfers min assist to supervision  Bowel/Bladder  min assist  Transfers  Supervision  Locomotion  Min A gait   Communication  mod I   Cognition  supervision   Pain  less<2  Safety/Judgment  min assist    Therapy Plan: PT Intensity: Minimum of 1-2 x/day ,45 to 90 minutes PT Frequency: 5 out of 7 days PT Duration Estimated Length of Stay: 14-18 days  OT Intensity: Minimum of 1-2 x/day, 45 to 90 minutes OT Frequency: 5 out of 7 days OT Duration/Estimated Length of Stay: 18-21 days SLP Intensity: Minumum of 1-2 x/day, 30 to 90 minutes SLP Frequency: 3 to 5 out of 7 days SLP Duration/Estimated Length of Stay: 21 days     Team Interventions: Nursing Interventions Patient/Family  Education, Pain Management, Dysphagia/Aspiration Precaution Training, Medication Management, Bladder Management, Discharge Planning, Bowel Management, Skin Care/Wound Management, Psychosocial Support, Disease Management/Prevention, Cognitive Remediation/Compensation  PT interventions Ambulation/gait training, Disease management/prevention, Pain management, Stair training, Visual/perceptual remediation/compensation, Wheelchair propulsion/positioning, Therapeutic Activities, Patient/family education, DME/adaptive equipment instruction, Training and development officer, Cognitive remediation/compensation, Psychosocial support, Therapeutic Exercise, UE/LE Strength taining/ROM, Skin care/wound management, Functional electrical stimulation, Functional mobility training, Community reintegration, Discharge planning, Neuromuscular re-education, Splinting/orthotics, UE/LE Coordination activities  OT Interventions Training and development officer, Cognitive remediation/compensation, Academic librarian, Engineer, drilling, Discharge planning, Functional mobility training, Patient/family education, UE/LE Strength taining/ROM, Neuromuscular re-education, Therapeutic Exercise, Therapeutic Activities, Self Care/advanced ADL retraining, Functional electrical stimulation, UE/LE Coordination activities, Visual/perceptual remediation/compensation, Splinting/orthotics  SLP Interventions Cueing hierarchy, Functional tasks, Patient/family education, Environmental controls, Dysphagia/aspiration precaution training, Internal/external aids  TR Interventions    SW/CM Interventions Discharge Planning, Patient/Family Education, Psychosocial Support   Barriers to Discharge MD  Medical stability  Nursing      PT Inaccessible home environment, Home environment access/layout, Behavior family very supportive and able to provide 24/7 assist, 3 steps to enter home w/ no rails, mild impusivity noted  OT      SLP      SW        Team Discharge Planning: Destination: PT-Home ,OT- Home , SLP-Home Projected Follow-up: PT-Outpatient PT, OT-  Home health OT, SLP-24 hour supervision/assistance, Home Health SLP, Outpatient SLP Projected Equipment Needs: PT-To be determined, OT- To be determined, SLP-None recommended by SLP Equipment Details: PT- , OT-  Patient/family involved in discharge planning: PT- Patient, Family member/caregiver,  OT-Patient, SLP-Patient  MD ELOS: 18-21 days. Medical Rehab Prognosis:  Good Assessment: 56 y.o.right handed femalewith unremarkable past medical history except tobacco abuse on no prescription medications. Presented 08/21/2017 to [facility] hospital with right-sided weakness and slurred speech. CT of the head reviewed, showing left CVA.  Per report, small acute infarct left insula, left putamen and possibly left parietal cortex. Hyperdense distal left ICA and M1 segment consistent with thrombus. CT angiogram head and neck showed positive emergent large vessel occlusion. Severe atheromatous stenosis of the left ICA bulb felt to be embolic source. Patient did receive TPA. Patient transferred to [facility] underwent revascularization per interventional radiology. Latest MRI shows acute infarct left temporal operculum, left posterior insula, left posterior lentiform nuclear medicine extending into the corona radiata and caudate body. Biparietal white matter microhemorrhage felt to be chronic. Echocardiogram with ejection fraction of 60% no wall motion abnormalities. Lower extremity Dopplers negative for DVT. Neurology follow-up with TEE that showed LAA without masses no PFO and loop recorder placed 08/28/2017. Presently on aspirin for CVA prophylaxis. Dysphagia #3 thin liquid diet. Patient with resulting functional deficits with mobility, transfers, speech, swallowing, cognition, ability to complete ADLs.  Will set goals for Min A/Supervision with PT/OTSLP.  See Team Conference Notes for  weekly updates to the plan of care

## 2017-08-28 NOTE — Progress Notes (Signed)
  Echocardiogram Echocardiogram Transesophageal has been performed.  Canda Podgorski T Guss Farruggia 08/28/2017, 12:53 PM

## 2017-08-28 NOTE — Progress Notes (Signed)
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 Per chart review patient lives with spouse. Independent prior to admission. Presented 08/21/2017 to [facility] hospital with right-sided weakness and slurred speech. CT of the head showed small acute infarct left insula, left putamen and possibly left parietal cortex. Hyperdense distal left ICA and M1 segment consistent with thrombus. CT angiogram head and neck showed positive emergent large vessel occlusion. Severe atheromatous stenosis of the left ICA bulb felt to be embolic source. Patient did receive TPA. Patient transferred to [facility] underwent revascularization per interventional radiology. Latest MRI shows acute infarct left temporal operculum, left posterior insula, left posterior lentiform nuclear medicine extending into the corona radiata and caudate body. Biparietal white matter microhemorrhage felt to be chronic. Echocardiogram with ejection fraction of 60% no wall motion abnormalities. Lower extremity Dopplers negative for DVT. Neurology follow-up with TEE that showed LAA without masses no PFO and loop recorder placed 08/28/2017.. Presently on aspirin for CVA prophylaxis. Dysphagia #3 thin liquid diet. Physical and  occupational therapy evaluations completed 08/24/2017 with recommendations of physical medicine rehabilitation consult. Patient was admitted for a comprehensive rehabilitation program   =NIH  Past Medical History  No past medical history on file.  Family History  family history includes Heart attack in her father.  Prior Rehab/Hospitalizations: No previous rehab admissions.  Has the patient had major surgery during 100 days prior to admission? No  Current Medications  No current facility-administered medications for this encounter.  No current outpatient medications on file.  Facility-Administered Medications Ordered in Other Encounters:  .  0.9 %  sodium chloride infusion, , Intravenous, Continuous, Deveshwar, Sanjeev, MD, Last Rate: 75 mL/hr at 08/28/17 0103 .  acetaminophen (TYLENOL) tablet 650 mg, 650 mg, Oral, Q4H PRN **OR** acetaminophen (TYLENOL) solution 650 mg, 650 mg, Per Tube, Q4H PRN **OR** acetaminophen (TYLENOL) suppository 650 mg, 650 mg, Rectal, Q4H PRN, Greta Doom, MD, 650 mg at 08/23/17 1759 .  aspirin tablet 325 mg, 325 mg, Oral, Daily, Costello, Mary A, NP, 325 mg at 08/28/17 [phone] .  atorvastatin (LIPITOR) tablet 80 mg, 80 mg, Oral, q1800, Costello, Mary A, NP, 80 mg at 08/27/17 1837 .  bisacodyl (DULCOLAX) suppository 10 mg, 10 mg, Rectal, Daily PRN, Jennelle Human B, NP .  chlorhexidine gluconate (MEDLINE KIT) (PERIDEX) 0.12 % solution 15 mL, 15 mL, Mouth Rinse, BID, Simpson, Paula B, NP, 15 mL at 08/27/17 2019 .  docusate (COLACE) 50 MG/5ML liquid 100 mg, 100 mg, Per Tube, BID PRN, Jennelle Human B, NP .  loratadine (CLARITIN) tablet 10 mg, 10 mg, Oral, Daily, Garvin Fila, MD, 10 mg at 08/28/17 [phone] .  MEDLINE mouth rinse, 15 mL, Mouth Rinse,  QID, Jennelle Human B, NP, 15 mL at 08/28/17 0347 .  metoprolol tartrate (LOPRESSOR) tablet 12.5 mg, 12.5 mg, Oral, BID, Garvin Fila, MD, 12.5 mg at 08/28/17 0839 .  nicotine (NICODERM CQ - dosed in mg/24  hours) patch 14 mg, 14 mg, Transdermal, Daily PRN, Costello, Mary A, NP  Patients Current Diet: No diet orders on file  Precautions / Restrictions     Has the patient had 2 or more falls or a fall with injury in the past year?No  Prior Activity Level    Home Assistive Devices / Equipment    Prior Device Use: Indicate devices/aids used by the patient prior to current illness, exacerbation or injury? None.  Patient reports having a bad right knee.  Prior Functional Level    Self Care: Did the patient need help bathing, dressing, using the toilet or eating?  Independent  Indoor Mobility: Did the patient need assistance with walking from room to room (with or without device)? Independent  Stairs: Did the patient need assistance with internal or external stairs (with or without device)? Independent  Functional Cognition: Did the patient need help planning regular tasks such as shopping or remembering to take medications? Independent  Current Functional Level Cognition       Extremity Assessment (includes Sensation/Coordination)          ADLs       Mobility       Transfers       Ambulation / Gait / Stairs / Wheelchair Mobility       Posture / Balance      Special needs/care consideration BiPAP/CPAP No CPM No Continuous Drip IV 0.9% NS 75 mL/hr Dialysis No        Life Vest No Oxygen No Special Bed No Trach Size No Wound Vac (area) No       Skin Bruising right side of back.                             Bowel mgmt: Last BM 08/27/17 with incontinence Bladder mgmt: [place] external catheter in place Diabetic mgmt No    Previous Home Environment    Discharge Living Setting    Social/Family/Support Systems    Goals/Additional Needs    Decrease burden of Care through IP rehab admission: N/A  Possible need for SNF placement upon discharge: Not anticipated  Patient Condition: This patient's medical and functional status has changed since the consult  dated: 08/24/17 in which the Rehabilitation Physician determined and documented that the patient's condition is appropriate for intensive rehabilitative care in an inpatient rehabilitation facility. See "History of Present Illness" (above) for medical update. Functional changes are: Currently requiring max assist +2 for transfers. Patient's medical and functional status update has been discussed with the Rehabilitation physician and patient remains appropriate for inpatient rehabilitation. Will admit to inpatient rehab today.  Preadmission Screen Completed By:  Retta Diones, 08/28/2017 4:28 PM ______________________________________________________________________   Discussed status with Dr. Posey Pronto on 08/28/17 at 1515 and received telephone approval for admission today.  Admission Coordinator:  Retta Diones, time 1515/Date 08/28/17

## 2017-08-28 NOTE — Transfer of Care (Signed)
Immediate Anesthesia Transfer of Care Note  Patient: [name]  Procedure(s) Performed: TRANSESOPHAGEAL ECHOCARDIOGRAM (TEE) (N/A )  Patient Location: Endoscopy Unit  Anesthesia Type:MAC  Level of Consciousness: awake and alert   Airway & Oxygen Therapy: Patient Spontanous Breathing and Patient connected to nasal cannula oxygen  Post-op Assessment: Report given to RN and Post -op Vital signs reviewed and stable  Post vital signs: Reviewed and stable  Last Vitals:  Vitals:   08/28/17 0550 08/28/17 1129  BP: 134/68 122/71  Pulse: 86 85  Resp: 20 17  Temp: 36.6 C 36.8 C  SpO2: 98% 93%    Last Pain:  Vitals:   08/28/17 1129  TempSrc: Oral  PainSc:          Complications: No apparent anesthesia complications

## 2017-08-28 NOTE — Plan of Care (Signed)
Mobility improving throughout day.

## 2017-08-28 NOTE — PMR Pre-admission (Signed)
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 Per chart review patient lives with spouse. Independent prior to admission. Presented 08/21/2017 to [facility] hospital with right-sided weakness and slurred speech. CT of the head showed small acute infarct left insula, left putamen and possibly left parietal cortex. Hyperdense distal left ICA and M1 segment consistent with thrombus. CT angiogram head and neck showed positive emergent large vessel occlusion. Severe atheromatous stenosis of the left ICA bulb felt to be embolic source. Patient did receive TPA. Patient transferred to [facility] underwent revascularization per interventional radiology. Latest MRI shows acute infarct left temporal operculum, left posterior insula, left posterior lentiform nuclear medicine extending into the corona radiata and caudate body. Biparietal white matter microhemorrhage felt to be chronic. Echocardiogram with ejection fraction of 60% no wall motion abnormalities. Lower extremity Dopplers negative for DVT. Neurology follow-up with TEE that showed LAA without masses no PFO and loop recorder placed 08/28/2017.. Presently on aspirin for CVA prophylaxis. Dysphagia #3  thin liquid diet. Physical and occupational therapy evaluations completed 08/24/2017 with recommendations of physical medicine rehabilitation consult. Patient was admitted for a comprehensive rehabilitation program  Total: 14=NIH  Past Medical History  History reviewed. No pertinent past medical history.  Family History  family history includes Heart attack in her father.  Prior Rehab/Hospitalizations: No previous rehab admissions.  Has the patient had major surgery during 100 days prior to admission? No  Current Medications   Current Facility-Administered Medications:  .  0.9 %  sodium chloride infusion, , Intravenous, Continuous, Deveshwar, Sanjeev, MD, Last Rate: 75 mL/hr at 08/28/17 0103 .  acetaminophen (TYLENOL) tablet 650 mg, 650 mg, Oral, Q4H PRN **OR** acetaminophen (TYLENOL) solution 650 mg, 650 mg, Per Tube, Q4H PRN **OR** acetaminophen (TYLENOL) suppository 650 mg, 650 mg, Rectal, Q4H PRN, Greta Doom, MD, 650 mg at 08/23/17 1759 .  aspirin tablet 325 mg, 325 mg, Oral, Daily, Costello, Mary A, NP, 325 mg at 08/28/17 [phone] .  atorvastatin (LIPITOR) tablet 80 mg, 80 mg, Oral, q1800, Costello, Mary A, NP, 80 mg at 08/27/17 1837 .  bisacodyl (DULCOLAX) suppository 10 mg, 10 mg, Rectal, Daily PRN, Jennelle Human B, NP .  chlorhexidine gluconate (MEDLINE KIT) (PERIDEX) 0.12 % solution 15 mL, 15 mL, Mouth Rinse, BID, Simpson, Paula B, NP, 15 mL at 08/27/17 2019 .  docusate (COLACE) 50 MG/5ML liquid 100 mg, 100 mg, Per Tube, BID PRN, Jennelle Human B, NP .  loratadine (CLARITIN) tablet 10 mg, 10 mg, Oral, Daily, Garvin Fila, MD, 10 mg at 08/28/17 [phone] .  MEDLINE mouth rinse, 15 mL, Mouth Rinse, QID, Simpson, Paula B, NP, 15 mL at 08/28/17 0347 .  metoprolol tartrate (LOPRESSOR) tablet 12.5 mg, 12.5 mg, Oral, BID, Garvin Fila, MD, 12.5 mg at 08/28/17 0839 .  nicotine (NICODERM CQ - dosed in mg/24 hours) patch 14 mg, 14 mg, Transdermal, Daily PRN, Costello, Mary A,  NP  Patients Current Diet: No diet orders on file  Precautions / Restrictions Precautions Precautions: Fall Restrictions Weight Bearing Restrictions: No   Has the patient had 2 or more falls or a fall with injury in the past year?No  Prior Activity Level Limited Community (1-2x/wk): Went out at least 3 X a week, was driving.  Home Assistive Devices / Equipment Home Assistive Devices/Equipment: None Home Equipment: Walker - 2 wheels, Shower seat, Crutches  Prior Device Use: Indicate devices/aids used by the patient prior to current illness, exacerbation or injury? None.  Patient reports having a bad right knee.  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  Independent  Indoor Mobility: Did the patient need assistance with walking from room to room (with or without device)? Independent  Stairs: Did the patient need assistance with internal or external stairs (with or without device)? Independent  Functional Cognition: Did the patient need help planning regular tasks such as shopping or remembering to take medications? Independent  Current Functional Level Cognition  Arousal/Alertness: Awake/alert Overall Cognitive Status: Impaired/Different from baseline Current Attention Level: Sustained Orientation Level: Oriented X4 Following Commands: Follows one step commands consistently Safety/Judgement: Decreased awareness of safety, Decreased awareness of deficits General Comments: pt able to use comb unassisted today, looking to right and able to follow commands to use LUE to move RUE Attention: Sustained Sustained Attention: Impaired Sustained Attention Impairment: Verbal basic Problem Solving: Appears intact(for basic self care task) Comments: to be assessed further, aphasia  impacting assessment    Extremity Assessment (includes Sensation/Coordination)  Upper Extremity Assessment: Defer to OT evaluation RUE Deficits / Details: Flaccid; no movement or synergy pattern observed RUE Sensation: decreased light touch, decreased proprioception RUE Coordination: decreased fine motor, decreased gross motor(not using functionally at this time)  Lower Extremity Assessment: RLE deficits/detail RLE Deficits / Details: no ArOM, no withdrawal to pain, pt reports unable to sense pressure to extremity    ADLs  Overall ADL's : Needs assistance/impaired Eating/Feeding: NPO Grooming: Moderate assistance, Sitting Upper Body Bathing: Moderate assistance, Sitting Lower Body Bathing: Maximal assistance, Sit to/from stand Upper Body Dressing : Moderate assistance, Sitting Lower Body Dressing: Maximal assistance, Sit to/from stand Lower Body Dressing Details (indicate cue type and reason): Pt able to assist with donning L sock and reaching behind to pull up panties Toileting- Clothing Manipulation and Hygiene: Maximal assistance Functional mobility during ADLs: +2 for physical assistance, Maximal assistance(able to step with LLE; total A to move RLE; knee buckling)    Mobility  Overal bed mobility: Needs Assistance Bed Mobility: Rolling, Sidelying to Sit Rolling: Min assist Sidelying to sit: Mod assist Supine to sit: Mod assist General bed mobility comments: min assist to roll right with use of hand rail and cues for sequence, mod assist to fully elevate trunk from sidelying with assist to move RLE off of bed    Transfers  Overall transfer level: Needs assistance Transfers: Sit to/from Stand Sit to Stand: Max assist, +2 physical assistance Stand pivot transfers: Max assist, +2 physical assistance General transfer comment: pt with right knee blocked throughout with physical  assist for weight shifting, stepping and preventing RLE buckling. stepped grossly 3' from bed to chair.  mod assist for reciprocal scooting in chair    Ambulation / Gait / Stairs / Wheelchair Mobility  Ambulation/Gait General Gait Details: unable    Posture / Balance Dynamic Sitting Balance Sitting balance - Comments: pt with midline posture and able to maintain without physical assist. with guarding able to reach in all directions and correct LOB with LUE x 5 min EOB Balance Overall balance assessment: Needs assistance Sitting balance-Leahy Scale: Poor Sitting balance - Comments: pt with midline posture and able to maintain without physical assist. with guarding able to reach in all directions and correct LOB with LUE x 5 min EOB Standing balance-Leahy Scale: Zero Standing balance comment: R knee buckling with support for weight shift, balance    Special needs/care consideration BiPAP/CPAP No CPM No Continuous Drip IV 0.9% NS 75 mL/hr Dialysis No        Life Vest No Oxygen No Special Bed No Trach Size No Wound Vac (area) No       Skin Bruising right side of back.                             Bowel mgmt: Last BM 08/27/17 with incontinence Bladder mgmt: [place] external catheter in place Diabetic mgmt No    Previous Home Environment Living Arrangements: Spouse/significant other Available Help at Discharge: Family, Available 24 hours/day Type of Home: House Home Layout: One level Home Access: Stairs to enter Entrance Stairs-Rails: None Entrance Stairs-Number of Steps: 3 Bathroom Shower/Tub: Public librarian, Multimedia programmer: Programmer, systems: Yes How Accessible: Accessible via wheelchair [place]: No  Discharge Living Setting Plans for Discharge Living Setting: Patient's home, House, Lives with (comment)(Lives with husband.) Type of Home at Discharge: [place]: One level Discharge Home Access: Stairs to enter [place] of Steps: 1 step entry Does the patient have any problems obtaining your medications?:  No  Social/Family/Support Systems Patient Roles: Spouse, Other (Comment)(Has a husband and a brother.) Contact Information: Shelly Spenser - spouse Anticipated Caregiver: Husband and brother Anticipated Caregiver's Contact Information: Herbie [place] - spouse - [phone] and has patient cell [phone] Ability/Limitations of Caregiver: Husband was laid off, using pension now, works side jobs, can be Buyer, retail Availability: 24/7 Discharge Plan Discussed with Primary Caregiver: Yes Is Caregiver In Agreement with Plan?: Yes Does Caregiver/Family have Issues with Lodging/Transportation while Pt is in Rehab?: No  Goals/Additional Needs Patient/Family Goal for Rehab: PT supervision, OT/SLP supervision to min assist goals Expected length of stay: 17-22 days Cultural Considerations: None Dietary Needs: Dys 3, thin liquids Equipment Needs: TBD Pt/Family Agrees to Admission and willing to participate: Yes Program Orientation Provided & Reviewed with Pt/Caregiver Including Roles  & Responsibilities: Yes  Decrease burden of Care through IP rehab admission: N/A  Possible need for SNF placement upon discharge: Not anticipated  Patient Condition: This patient's medical and functional status has changed since the consult dated: 08/24/17 in which the Rehabilitation Physician determined and documented that the patient's condition is appropriate for intensive rehabilitative care in an inpatient rehabilitation facility. See "History of Present Illness" (above) for medical update. Functional changes are: Currently requiring max assist +2 for transfers. Patient's medical and functional status update has been discussed with the Rehabilitation physician and patient remains appropriate for inpatient rehabilitation. Will admit to inpatient rehab today.  Preadmission Screen Completed By:  Jodell Cipro  M, 08/28/2017 3:15 PM ______________________________________________________________________   Discussed  status with Dr. Posey Pronto on 08/28/17 at 1515 and received telephone approval for admission today.  Admission Coordinator:  Retta Diones, time 1515/Date 08/28/17

## 2017-08-28 NOTE — Consult Note (Signed)
ELECTROPHYSIOLOGY CONSULT NOTE  Patient ID: DECEMBER HEDTKE MRN: [id number], DOB/AGE: 03-Jan-1961   Admit date: 08/21/2017 Date of Consult: 08/28/2017  Primary Physician: Patient, No Pcp Per Primary Cardiologist: none Reason for Consultation: Cryptogenic stroke ; recommendations regarding Implantable Loop Recorder requested by Dr. Leonie Man  History of Present Illness Ervin RAELLE CHAMBERS was admitted on 08/21/2017 with R sided weakness/hemiparesis and speech difficulty.  They first developed symptoms while at home, unwitnessed. She was found with Acute Left MCA Stroke, neurology notes,  with Left ICA occlusion and is s/p TPA and thrombectomy: 40% right ICA bulb atheromatous narrowing  she has undergone workup for stroke including echocardiogram and carotid angio.  The patient has been monitored on telemetry which has demonstrated sinus rhythm with no arrhythmias.  Inpatient stroke work-up is to be completed with a TEE.   Echocardiogram this admission demonstrated Study Conclusions - Left ventricle: The cavity size was normal. Wall thickness was   increased in a pattern of mild LVH. Systolic function was normal.   The estimated ejection fraction was in the range of 55% to 60%.   Wall motion was normal; there were no regional wall motion   abnormalities. The study is not technically sufficient to allow   evaluation of LV diastolic function. - Mitral valve: Mildly thickened leaflets . There was mild   regurgitation. - Left atrium: Moderately dilated. - Right atrium: Moderately dilated. - Inferior vena cava: The vessel was dilated. The respirophasic   diameter changes were blunted (< 50%), consistent with elevated   central venous pressure. On vent. Impressions: - LVEF 55-60%, mild LVH, normal wall motion, mild MR, moderate   biatrial enlargment, dilated IVC on vent.    Lab work is reviewed.  Prior to admission, the patient denies chest pain, shortness of breath, dizziness,  palpitations, or syncope.  Her speech is improved, though remains with R hemiparesis.  She is emotional about this but denies any active symptoms otherwise, there are plans to CIR at discharge, looks to be planned for today.    History reviewed. No pertinent past medical history.  The patient denies any known past medical history, she has not been to or seena healthcare provider for many years  + smoker  Surgical History:  Past Surgical History:  Procedure Laterality Date  . IR ANGIO INTRA EXTRACRAN SEL COM CAROTID INNOMINATE UNI R MOD SED  08/21/2017  . IR ANGIO VERTEBRAL SEL SUBCLAVIAN INNOMINATE UNI R MOD SED  08/21/2017  . IR PERCUTANEOUS ART THROMBECTOMY/INFUSION INTRACRANIAL INC DIAG ANGIO  08/21/2017  . RADIOLOGY WITH ANESTHESIA N/A 08/21/2017   Procedure: RADIOLOGY WITH ANESTHESIA;  Surgeon: Luanne Bras, MD;  Location: [facility];  Service: Radiology;  Laterality: N/A;     Medications Prior to Admission  Medication Sig Dispense Refill Last Dose  . loratadine (CLARITIN) 10 MG tablet Take 10 mg by mouth daily.       Inpatient Medications:  . aspirin  325 mg Oral Daily  . atorvastatin  80 mg Oral q1800  . chlorhexidine gluconate (MEDLINE KIT)  15 mL Mouth Rinse BID  . loratadine  10 mg Oral Daily  . mouth rinse  15 mL Mouth Rinse QID  . metoprolol tartrate  12.5 mg Oral BID    Allergies: No Known Allergies  Social History   Socioeconomic History  . Marital status: Single    Spouse name: Not on file  . Number of children: Not on file  . Years of education: Not on file  .  Highest education level: Not on file  Social Needs  . Financial resource strain: Not on file  . Food insecurity - worry: Not on file  . Food insecurity - inability: Not on file  . Transportation needs - medical: Not on file  . Transportation needs - non-medical: Not on file  Occupational History  . Not on file  Tobacco Use  . Smoking status: Current Every Day Smoker  . Smokeless tobacco:  Never Used  Substance and Sexual Activity  . Alcohol use: Not on file  . Drug use: No  . Sexual activity: Not on file  Other Topics Concern  . Not on file  Social History Narrative  . Not on file     Family History  Problem Relation Age of Onset  . Heart attack Father        Review of Systems: All other systems reviewed and are otherwise negative except as noted above.  Physical Exam: Vitals:   08/27/17 1849 08/27/17 2200 08/28/17 0200 08/28/17 0550  BP: (!) 141/73 136/69 140/69 134/68  Pulse: 95 87 87 86  Resp: 20 20 20 20   Temp: 98.2 F (36.8 C) 97.6 F (36.4 C) 97.9 F (36.6 C) 97.8 F (36.6 C)  TempSrc: Oral Oral Oral Oral  SpO2: 96% 95% 95% 98%  Weight:      Height:        GEN- The patient is well appearing, alert and oriented x 3 today.   Head- normocephalic, atraumatic Eyes-  Sclera clear, conjunctiva pink Ears- hearing intact Oropharynx- clear Neck- supple Lungs- CTA b/l, normal work of breathing Heart- RRR, no murmurs, rubs or gallops  GI- soft, NT, ND Extremities- no clubbing, cyanosis, or edema MS- no significant deformity or atrophy Skin- no rash or lesion Psych- euthymic mood, full affect   Labs:   Lab Results  Component Value Date   WBC 10.8 (H) 08/28/2017   HGB 13.5 08/28/2017   HCT 40.4 08/28/2017   MCV 95.1 08/28/2017   PLT 397 08/28/2017    Recent Labs  Lab 08/21/17 1829  08/28/17 0429  NA 133*   < > 136  K 3.6   < > 3.7  CL 101   < > 105  CO2 26   < > 23  BUN 17   < > 10  CREATININE 0.69   < > 0.44  CALCIUM 9.6   < > 8.7*  PROT 8.3*  --   --   BILITOT 0.4  --   --   ALKPHOS 73  --   --   ALT 27  --   --   AST 32  --   --   GLUCOSE 96   < > 98   < > = values in this interval not displayed.   Lab Results  Component Value Date   TROPONINI 0.03 (HH) 08/22/2017   Lab Results  Component Value Date   CHOL 237 (H) 08/22/2017   Lab Results  Component Value Date   HDL 35 (L) 08/22/2017   Lab Results  Component  Value Date   LDLCALC 143 (H) 08/22/2017   Lab Results  Component Value Date   TRIG 295 (H) 08/22/2017   TRIG 301 (H) 08/22/2017   Lab Results  Component Value Date   CHOLHDL 6.8 08/22/2017   No results found for: LDLDIRECT  No results found for: DDIMER   Radiology/Studies:  Ct Angio Head W Or Wo Contrast Result Date: 08/21/2017 CLINICAL DATA:  Left MCA  syndrome.  Abnormal head CT. EXAM: CT ANGIOGRAPHY HEAD AND NECK TECHNIQUE: Multidetector CT imaging of the head and neck was performed using the standard protocol during bolus administration of intravenous contrast. Multiplanar CT image reconstructions and MIPs were obtained to evaluate the vascular anatomy. Carotid stenosis measurements (when applicable) are obtained utilizing NASCET criteria, using the distal internal carotid diameter as the denominator. CONTRAST:  78m ISOVUE-370 IOPAMIDOL (ISOVUE-370) INJECTION 76% COMPARISON:  None. FINDINGS: CTA NECK FINDINGS Aortic arch: Extensive atheromatous thickening and calcification of the arch, especially for age. Low-density plaque or luminal thrombus seen at the isthmus. There is degradation by motion artifact. Right carotid system: Atheromatous plaque on the ventral distal common carotid. There is mildly irregular plaque at the ICA bulb with 40% proximal ICA stenosis. Negative for dissection. Left carotid system: Motion affects visualization of the proximal common carotid. There is atheromatous wall thickening of the ventral distal common carotid wall. Plaque at the common carotid bifurcation and ICA bulb with severe stenosis at the ICA bulb, with downstream underfilling. This is a likely source of embolism. No superimposed dissection. Vertebral arteries: Motion affects visualization of the proximal subclavian artery on the left. There is likely mild ostial stenosis. No visible left vertebral flow until the V2 segment. There is strong dominance of the right vertebral artery without superimposed flow  limiting stenosis. There is scattered atherosclerotic plaque on the right vertebral artery. Skeleton: No acute or aggressive finding. Other neck: No incidental mass or adenopathy. Upper chest: No acute finding Review of the MIP images confirms the above findings CTA HEAD FINDINGS Anterior circulation: Faint flow seen in the left proximal intracranial ICA, with none by the level of the anterior genu. Occluded left MCA with distal opacification beginning at the M2/3 level. There is a robust anterior communicating artery and good bilateral A2 and distal flow. No right-sided branch occlusion is seen. Posterior circulation: Diminutive left vertebral artery, especially before the PICA. The distal left V4 segment may be flow in retrograde. Negative for branch occlusion or flow limiting stenosis. Venous sinuses: As permitted by contrast timing, patent. Anatomic variants: None significant Delayed phase: Not performed in the emergent setting. See head CT for record of communication. Review of the MIP images confirms the above findings IMPRESSION: 1. Positive for emergent large vessel occlusion. No flow seen in the left M1 and distal intracranial LICA. There is flow seen in the left M2/3 branches suggesting favorable collaterals. Aspects scored on prior noncontrast head CT. 2. Severe atheromatous stenosis at the left ICA bulb which may be an embolic source. There is comparatively poor flow between the stenosis and intracranial occlusion. 3. ~ 40% right ICA bulb atheromatous narrowing. 4. 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 There is good flow in the distal left V4 segment and left PICA, which may be retrograde. 5. Advanced atherosclerosis of the aortic arch with irregular plaque/thrombus at the isthmus. Motion affects visualization of the great vessel ostia. Electronically Signed   By: JMonte FantasiaM.D.   On: 08/21/2017 19:03    Ct Head Wo Contrast Result Date: 08/21/2017 CLINICAL  DATA:  Status post stroke intervention EXAM: CT HEAD WITHOUT CONTRAST TECHNIQUE: Contiguous axial images were obtained from the base of the skull through the vertex without intravenous contrast. COMPARISON:  Head CT and CTA 08/21/2017 FINDINGS: Brain: No mass lesion, intraparenchymal hemorrhage or extra-axial collection. No evidence of acute cortical infarct. Brain parenchyma and CSF-containing spaces are normal for age. Vascular: No hyperdense vessel or unexpected  calcification. Intravenous contrast persist within the vessels. Skull: Normal visualized skull base, calvarium and extracranial soft tissues. Sinuses/Orbits: No sinus fluid levels or advanced mucosal thickening. No mastoid effusion. Normal orbits. IMPRESSION: Normal head CT. No intraparenchymal hemorrhage, contrast staining or cytotoxic edema. Electronically Signed   By: Ulyses Jarred M.D.   On: 08/21/2017 23:35    Mr Brain Wo Contrast Result Date: 08/22/2017 CLINICAL DATA:  56 y/o  F; post tPA stroke follow-up. EXAM: MRI HEAD WITHOUT CONTRAST TECHNIQUE: Multiplanar, multiecho pulse sequences of the brain and surrounding structures were obtained without intravenous contrast. COMPARISON:  08/21/2017 CT of head and CT angiogram of head. 08/21/2017 cerebral angiogram. FINDINGS: Brain: Primary region of diffusion involving left temporal operculum, left posterior insula, left posterior lentiform nucleus, extending into corona radiata and caudate body compatible with acute infarction. The infarct spans approximately 2.7 x 2.0 x 4.5 cm (volume = 13 cm^3). Small cortical foci of reduced diffusion are present in left parietal, left posterior temporal, and left frontal lobes. Additionally there are subcentimeter foci of reduced diffusion in left frontal centrum semiovale and the left occipital lobe. Background of mild chronic microvascular ischemic changes and parenchymal volume loss of the brain. Punctate foci of susceptibility are present within the  bilateral parietal white matter compatible hemosiderin deposition of microhemorrhage. No mass effect, hydrocephalus, or extra-axial collection. Vascular: Normal central flow voids including left proximal MCA. Skull and upper cervical spine: Normal marrow signal. Sinuses/Orbits: Mild diffuse paranasal sinus mucosal thickening. Otherwise negative. Other: None. IMPRESSION: 1. Acute infarction involving left temporal operculum, left posterior insula, left posterior lentiform nucleus, and extending into corona radiata and caudate body, approximately 13 cc. Additional small foci of acute infarction are present in left posterior temporal, left frontal, left parietal, and left occipital lobes. 2. Biparietal white matter microhemorrhage, probably chronic. 3. Mild chronic microvascular ischemic changes and mild parenchymal volume loss of the brain. 4. Mild paranasal sinus disease. Electronically Signed   By: Kristine Garbe M.D.   On: 08/22/2017 17:59    Dg Chest Port 1 View Result Date: 08/24/2017 CLINICAL DATA:  Pneumonia EXAM: PORTABLE CHEST 1 VIEW COMPARISON:  08/22/2017 FINDINGS: Endotracheal and NG tubes removed. Subsegmental atelectasis at the left base increased. Dense opacity projecting over the right hilum extending into the lower lobes has increased. No pneumothorax. Cardiomegaly. IMPRESSION: Extubated Increased left basilar atelectasis. Right hilar prominence and increasing opacity towards the right lung base have developed. An inflammatory process is suspected. Electronically Signed   By: Marybelle Killings M.D.   On: 08/24/2017 08:18     Ir Percutaneous Art Thrombectomy/infusion Intracranial Inc Diag Angio Result Date: 08/23/2017 INDICATION: Acute onset of right-sided hemiplegia, global aphasia and left side gaze deviation. Occluded left internal carotid artery terminus, left middle cerebral artery M1 segment, and left anterior cerebral artery A1 segment. EXAM: 1. EMERGENT LARGE VESSEL OCCLUSION  THROMBOLYSIS (anterior CIRCULATION) COMPARISON:  CT angiogram of the head and neck of 08/21/2017. MEDICATIONS: Ancef 2 g IV was administered within 1 hour of the procedure. ANESTHESIA/SEDATION: General anesthesia. CONTRAST:  Isovue 300 approximately 70 mL. FLUOROSCOPY TIME:  Fluoroscopy Time: 26 minutes 0 seconds (1133 mGy). COMPLICATIONS: None immediate. TECHNIQUE: Following a full explanation of the procedure along with the potential associated complications, an informed witnessed consent was obtained. The risks of intracranial hemorrhage of 10%, worsening neurological deficit, ventilator dependency, death and inability to revascularize were all reviewed in detail with the patient's husband. The patient was then put under general anesthesia by the Department of Anesthesiology at  [facility]. The left groin was prepped and draped in the usual sterile fashion. Thereafter using modified Seldinger technique, transfemoral access into the left common femoral artery was obtained without difficulty. Over a 0.035 inch guidewire a 5 French Pinnacle sheath was inserted. Through this, and also over a 0.035 inch guidewire a 5 [place] JB 1 catheter was advanced to the aortic arch region and selectively positioned in the right subclavian artery and the right common carotid artery and the left common carotid artery. FINDINGS: The right subclavian arteriogram demonstrated the origin of the right vertebral artery to be widely patent. The vessel is seen to opacify normally to the cranial skull base. Wide patency is seen of the right vertebrobasilar junction at the uppermost visualized level of the right posterior-inferior cerebellar artery. The right common carotid arteriogram demonstrates the right external carotid artery to be mildly narrowed at its origin. Its branches remain widely patent. The right internal carotid artery at the bulb demonstrates approximately 30% stenosis by the NASCET criteria secondary to a  circumferential smooth atherosclerotic plaque. No evidence of intraluminal filling defects are seen. The vessel is, otherwise, seen to opacify normally to the cranial skull base. Wide patency is seen of the petrous, cavernous and supraclinoid segments. 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 The right middle cerebral artery and the right anterior cerebral artery opacify into the capillary and venous phases. There is mild caliber irregularity of the subtalar division of the right middle cerebral artery. Prompt opacification of the anterior communicating artery of the right anterior cerebral A2 segment and distally is seen. There is faint opacification of the distal right anterior cerebral A1 segment. The left common carotid arteriogram demonstrates mild narrowing of the proximal left external carotid artery. Its branches opacify normally, otherwise. The left internal carotid artery at the bulb demonstrates moderate narrowing secondary to circumferential plaque. Distal to this, the vessel is seen to opacify to the cranial skull base. There slow ascent of contrast to the petrous segment. Distal to this, the petrous and cavernous opacify in a delayed fashion. Filling defects are seen in the left internal carotid artery distal cavernous segment. With complete occlusion of the supraclinoid segment, there is nonvisualization of the left middle cerebral artery or of the left anterior cerebral artery proximally. PROCEDURE: ENDOVASCULAR COMPLETE REVASCULARIZATION OF OCCLUDED LEFT INTERNAL CAROTID ARTERY TERMINUS, THE LEFT MIDDLE CEREBRAL ARTERY AND OF THE LEFT ANTERIOR CEREBRAL ARTERY WITH 1 PASS WITH THE SOLITAIRE 6 MM X 40 MM RETRIEVAL DEVICE, AND 4.5 MG OF SUPER SELECTIVE INTRACRANIAL INTRA-ARTERIAL INTEGRILIN ACHIEVING A TICI 3 REPERFUSION. The 5 [place] JB 1 catheter in the left common carotid artery was exchanged over a 0.035  inch 300 cm Rosen exchange guidewire for an 8 French 55 cm Brite tip neurovascular sheath using biplane roadmap technique and constant fluoroscopic guidance. Good aspiration was obtained from the hub of the neurovascular sheath. This was then connected to continuous heparinized saline infusion. Over the [employer] guidewire, an 8 [place] 85 cm FlowGate balloon guide catheter which been prepped with 50% heparinized saline infusion and 50% contrast was then advanced and positioned just proximal to the left common carotid bifurcation. The guidewire was removed. Good aspiration obtained from the hub of the [facility] guide catheter. A gentle contrast injection demonstrated no evidence of spasms, dissections or of intraluminal filling defects. Over a 0.035 inch Roadrunner guidewire, using biplane roadmap technique and constant fluoroscopic guidance, a 5 [place] 115  cm Catalyst guide catheter was then advanced to the distal left internal carotid artery at the cervical petrous junction. The guidewire was removed. Good aspiration obtained from the hub of the 5 [place] Catalyst guide catheter. A control arteriogram performed through the 5 [place] guide catheter demonstrated no change in the intracranial circulation. Over a 0.014 inch Softip Synchro micro guidewire, a Via 27 microcatheter was then advanced to the distal end of the 5 [place] Catalyst guide catheter and the petrous portion of the left internal carotid artery. Thereafter the combination of the micro guidewire, the Via 027 microcatheter and the 115 cm 5 [place] Catalyst guide catheter combination was then advanced to the cavernous segment of the left internal carotid artery. Using a torque device, the micro guidewire was then gently advanced without difficulty into the M2 M3 region of the inferior division of the left middle cerebral artery followed by the microcatheter. The guidewire was removed. Good aspiration obtained from the hub of the Via microcatheter. A  gentle contrast injection demonstrated slow antegrade flow of contrast. A 6 mm x 40 mm Solitaire FR retrieval device was then advanced in a coaxial manner and with constant heparinized saline infusion using biplane roadmap technique and constant fluoroscopic guidance to the distal end of the microcatheter. The patient was given 4.5 mg of intra-arterial Integrilin through the 5 [place] Catalyst guide catheter in the left internal carotid artery supraclinoid segment. The O ring on the delivery microcatheter was then loosened. The entire system was then straightened. With slight forward gentle traction with the right hand on the delivery micro guidewire, with the left hand the delivery microcatheter was then retrieved unsheathing the retrieval device distally and proximally with optimal coverage. At this time, the proximal portion of the retrieval device was captured into the microcatheter. The 5 [place] Catalyst guide catheter was advanced to the supraclinoid left ICA. With constant aspiration being applied with a 60 mL syringe at the hub of the 8 [place] Flowgate guide catheter, and with a 25 mL syringe at the hub of the Catalyst guide catheter, the combination of the retrieval device, Via microcatheter and the 5 [place] guide catheter was then retrieved and removed as aspiration was continued. Proximal flow arrest in the distal left internal carotid artery was established prior to the retrieval of this combination. Aspiration was continued with a 60 mL syringe as the balloon was deflated in the left internal carotid artery. A significant amount of a clot was noted within the interstices of the retrieval device. Also noted was clot within the hub of the [place], and also within the aspirate. Brisk back bleed at the hub of the 8 [place] FlowGate guide catheter was noted following the retrieval of the combination. A control arteriogram performed through the 8 [place] FlowGate guide catheter demonstrated complete  angiographic revascularization of the occluded left middle cerebral artery with flash filling of the left anterior cerebral artery. Significant spasm was noted in the left M1 segment, and also the trifurcation branches. The spasm responded to 3 aliquots of 25 mics of nitroglycerin via the 8 [place] FlowGate guide catheter in the left internal carotid artery. A final control arteriogram through the 8 [place] FlowGate guide catheter in the left internal carotid artery demonstrated complete angiographic revascularization of the previously occluded left internal carotid artery, the left middle cerebral artery and the left anterior cerebral A1 segment. There was noted an approximately 50% stenosis of the left middle cerebral artery M1 segment probably related to intracranial arteriosclerosis. Also demonstrated  were mild focal areas of caliber irregularity involving the inferior division and probably also evident of intracranial arteriosclerosis. A TICI 3 reperfusion had been established. Throughout the procedure, the patient's blood pressure and neurological status remained stable. No angiographic evidence of extravasation of contrast, or of mass-effect on the major vessel was seen. The 8 [place] FlowGate guide catheter, and the 8 [place] neurovascular sheath were then retrieved into the abdominal aorta and exchanged over a J-tip guidewire for a 9 [place] Pinnacle sheath. However, this was replaced with an 8 [place] Pinnacle sheath given the occlusion of the superficial femoral artery with the 9 French sheath. A control arteriogram performed through the 8 French sheath demonstrated brisk antegrade flow into the SFA and the profunda on the left side. The patient's left groin appeared soft without evidence of a hematoma. The distal pulses remained Dopplerable in both feet in the dorsalis pedis, and the posterior tibial regions. The patient was then transported to the CT scanner for post procedure CT scan of the brain.  IMPRESSION: Status post endovascular complete revascularization of occluded left internal carotid artery terminus extending into the left middle cerebral artery and the left anterior cerebral artery with 1 pass with a 6 mm x 40 mm Solitaire FR retrieval device, and 4.5 mg of super selective intra-arterial intracranial Integrelin achieving a TICI 3 reperfusion. PLAN: Postprocedure CT scan of brain. Electronically Signed   By: Luanne Bras M.D.   On: 08/22/2017 20:34      12-lead ECG SR All prior EKG's in EPIC reviewed with no documented atrial fibrillation  Telemetry SR, occ PAC's, blocked PACs, occ PVCs, rare couplets, one NSVT 6 beats  Assessment and Plan:  1. Cryptogenic stroke The patient presents with cryptogenic stroke.  The patient has a TEE planned for this AM.  I spoke at length with the patient about monitoring for afib with either a 30 day event monitor or an implantable loop recorder.  Risks, benefits, and alteratives to implantable loop recorder were discussed with the patient today.   At this time, the patient is very clear in her decision to proceed with implantable loop recorder.   Wound care was reviewed with the patient (keep incision clean and dry for 3 days).  Wound check has been scheduled for the patient  Please call with questions.   Baldwin Jamaica, PA-C 08/28/2017  I have seen and examined this patient with Tommye Standard.  Agree with above, note added to reflect my findings.  On exam, RRR, no murmurs, lungs clear. Patient with cryptogenic stroke and right sided hemiplegia. Plan for TEE later today. If no obvious source of stroke, will plan for [facility] monitor. Risks and benefits discussed. Risks include but not limited to bleeding, infection. She understands the risks and has agreed to the procedure.    Will M. Camnitz MD 08/28/2017 10:32 AM

## 2017-08-28 NOTE — Progress Notes (Signed)
Patient and family ere informed about rehab process including the patient safety plan ,and rehab booklet.

## 2017-08-28 NOTE — Progress Notes (Signed)
Speech Language Pathology Treatment: Dysphagia  Patient Details Name: [name] MRN: [id number] DOB: 06/05/1961 Today's Date: 08/28/2017 Time: 4098-1191 SLP Time Calculation (min) (ACUTE ONLY): 12 min  Assessment / Plan / Recommendation Clinical Impression  F/u diet tolerance assessment complete with focus on use of compensatory strategies to maximize airway protection. Patient able to independently recall and initiate utilization of chin tuck strategy with thin liquids, required min verbal, visual,and tactile cueing for accurate carryout of strategy. No overt s/s of aspiration with intake of dysphagia 3 solids, thin liquid. Note that verbal output has significantly improved from initial evaluation. Plan to discharge to inpatient rehab today. Recommend full re-evaluation of communication and cognition to establish plan of care.    HPI HPI: 56 year old right handed female with no significant PMH/ has not seen physican in 15 years who presented to [facility] with acute onset of aphasia, right sided weakness and left gaze 11/26. LSW at 1515. CT head negative for ICH however CTA showed left MCA and left ICA occlusion. TPA given at 1918. Transferred to [facility] for endovascular revascularization 1/26, Intubated for procedure. Extubated 11/28.       SLP Plan  Continue with current plan of care       Recommendations  Diet recommendations: Dysphagia 3 (mechanical soft);Thin liquid Liquids provided via: Cup;Straw Medication Administration: Crushed with puree Supervision: Full supervision/cueing for compensatory strategies Compensations: Slow rate;Small sips/bites Postural Changes and/or Swallow Maneuvers: Chin tuck                Oral Care Recommendations: Oral care BID Follow up Recommendations: Inpatient Rehab SLP Visit Diagnosis: Dysphagia, oropharyngeal phase (R13.12) Plan: Continue with current plan of care       GO             Kaitlyn Lango MA,  CCC-SLP [phone]    [name] 08/28/2017, 3:06 PM

## 2017-08-29 ENCOUNTER — Inpatient Hospital Stay (HOSPITAL_COMMUNITY): Payer: Self-pay | Admitting: Speech Pathology

## 2017-08-29 ENCOUNTER — Encounter (HOSPITAL_COMMUNITY): Payer: Self-pay | Admitting: Internal Medicine

## 2017-08-29 ENCOUNTER — Inpatient Hospital Stay (HOSPITAL_COMMUNITY): Payer: Self-pay | Admitting: Physical Therapy

## 2017-08-29 ENCOUNTER — Inpatient Hospital Stay (HOSPITAL_COMMUNITY): Payer: Self-pay | Admitting: Occupational Therapy

## 2017-08-29 DIAGNOSIS — R740 Nonspecific elevation of levels of transaminase and lactic acid dehydrogenase [LDH]: Secondary | ICD-10-CM

## 2017-08-29 DIAGNOSIS — I63512 Cerebral infarction due to unspecified occlusion or stenosis of left middle cerebral artery: Secondary | ICD-10-CM

## 2017-08-29 DIAGNOSIS — R7401 Elevation of levels of liver transaminase levels: Secondary | ICD-10-CM

## 2017-08-29 DIAGNOSIS — I69391 Dysphagia following cerebral infarction: Secondary | ICD-10-CM

## 2017-08-29 DIAGNOSIS — G8191 Hemiplegia, unspecified affecting right dominant side: Secondary | ICD-10-CM

## 2017-08-29 DIAGNOSIS — I10 Essential (primary) hypertension: Secondary | ICD-10-CM

## 2017-08-29 LAB — CBC WITH DIFFERENTIAL/PLATELET
BASOS ABS: 0 10*3/uL (ref 0.0–0.1)
Basophils Relative: 0 %
Eosinophils Absolute: 0.4 10*3/uL (ref 0.0–0.7)
Eosinophils Relative: 4 %
HEMATOCRIT: 41.7 % (ref 36.0–46.0)
Hemoglobin: 14 g/dL (ref 12.0–15.0)
LYMPHS ABS: 2.1 10*3/uL (ref 0.7–4.0)
LYMPHS PCT: 22 %
MCH: 31.6 pg (ref 26.0–34.0)
MCHC: 33.6 g/dL (ref 30.0–36.0)
MCV: 94.1 fL (ref 78.0–100.0)
MONO ABS: 0.9 10*3/uL (ref 0.1–1.0)
MONOS PCT: 10 %
NEUTROS ABS: 6.2 10*3/uL (ref 1.7–7.7)
Neutrophils Relative %: 64 %
Platelets: 403 10*3/uL — ABNORMAL HIGH (ref 150–400)
RBC: 4.43 MIL/uL (ref 3.87–5.11)
RDW: 13.1 % (ref 11.5–15.5)
WBC: 9.7 10*3/uL (ref 4.0–10.5)

## 2017-08-29 LAB — COMPREHENSIVE METABOLIC PANEL
ALT: 28 U/L (ref 14–54)
AST: 45 U/L — AB (ref 15–41)
Albumin: 2.9 g/dL — ABNORMAL LOW (ref 3.5–5.0)
Alkaline Phosphatase: 69 U/L (ref 38–126)
Anion gap: 10 (ref 5–15)
BILIRUBIN TOTAL: 0.9 mg/dL (ref 0.3–1.2)
BUN: 14 mg/dL (ref 6–20)
CO2: 26 mmol/L (ref 22–32)
CREATININE: 0.56 mg/dL (ref 0.44–1.00)
Calcium: 8.9 mg/dL (ref 8.9–10.3)
Chloride: 100 mmol/L — ABNORMAL LOW (ref 101–111)
GFR calc Af Amer: >60 mL/min (ref >60)
Glucose, Bld: 88 mg/dL (ref 65–99)
POTASSIUM: 3.9 mmol/L (ref 3.5–5.1)
Sodium: 136 mmol/L (ref 135–145)
TOTAL PROTEIN: 6.5 g/dL (ref 6.5–8.1)

## 2017-08-29 MED ORDER — HYDROCORTISONE 1 % EX CREA
TOPICAL_CREAM | Freq: Three times a day (TID) | CUTANEOUS | Status: DC | PRN
  Administered 2017-08-29 – 2017-08-30 (×2): via TOPICAL
  Filled 2017-08-29: qty 28

## 2017-08-29 NOTE — Care Management Note (Signed)
[facility] Individual Statement of Services  Patient Name:  [name]  Date:  08/29/2017  Welcome to the [facility].  Our goal is to provide you with an individualized program based on your diagnosis and situation, designed to meet your specific needs.  With this comprehensive rehabilitation program, you will be expected to participate in at least 3 hours of rehabilitation therapies Monday-Friday, with modified therapy programming on the weekends.  Your rehabilitation program will include the following services:  Physical Therapy (PT), Occupational Therapy (OT), Speech Therapy (ST), 24 hour per day rehabilitation nursing, Therapeutic Recreaction (TR), Neuropsychology, Case Management (Social Worker), Rehabilitation Medicine, Nutrition Services and Pharmacy Services  Weekly team conferences will be held on Wednesday to discuss your progress.  Your Social Worker will talk with you frequently to get your input and to update you on team discussions.  Team conferences with you and your family in attendance may also be held.  Expected length of stay: 18-21 days Overall anticipated outcome: supervision-min assist level  Depending on your progress and recovery, your program may change. Your Social Worker will coordinate services and will keep you informed of any changes. Your Social Worker's name and contact numbers are listed  below.  The following services may also be recommended but are not provided by the [facility] will be made to provide these services after discharge if needed.  Arrangements include referral to agencies that provide these services.  Your insurance has been verified to be:  Self Pay-applying for Medicaid Your primary doctor is:  None  Pertinent information will be  shared with your doctor and your insurance company.  Social Worker:  Ovidio Kin, [place] or ([phone]  Information discussed with and copy given to patient by: Elease Hashimoto, 08/29/2017, 1:40 PM

## 2017-08-29 NOTE — Progress Notes (Signed)
   08/29/17 1100  Clinical Encounter Type  Visited With Patient and family together  Visit Type Follow-up  Referral From Chaplain  Spiritual Encounters  Spiritual Needs Prayer  Stress Factors  Patient Stress Factors Health changes  Family Stress Factors Health changes;Major life changes  Chaplain visited with the PT as a follow up during her move to rehab unit.  Met with PT and Husband.  There is a lot of joy in the room at the progress the PT is making.  PT and husband are both very tearful

## 2017-08-29 NOTE — Evaluation (Signed)
Occupational Therapy Assessment and Plan  Patient Details  Name: [name] MRN: [id number] Date of Birth: Apr 18, 1961  OT Diagnosis: cognitive deficits, flaccid hemiplegia and hemiparesis, hemiplegia affecting dominant side and muscle weakness (generalized) Rehab Potential: Rehab Potential (ACUTE ONLY): Excellent ELOS: 18-21 days   Today's Date: 08/29/2017 OT Individual Time: 1004-1101 OT Individual Time Calculation (min): 57 min     Problem List:  Patient Active Problem List   Diagnosis Date Noted  . Transaminitis   . 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 Cerebrovascular accident (CVA) due to occlusion of left middle cerebral artery (HCC)   . Right hemiplegia (HCC)   . Dysphagia, post-stroke   . Tobacco abuse   . Hyperlipidemia   . Benign essential HTN   . Endotracheally intubated   . Acute respiratory insufficiency   . Acute embolic stroke (HCC) 08/21/2017    Past Medical History: No past medical history on file. Past Surgical History:  Past Surgical History:  Procedure Laterality Date  . IR ANGIO INTRA EXTRACRAN SEL COM CAROTID INNOMINATE UNI R MOD SED  08/21/2017  . IR ANGIO VERTEBRAL SEL SUBCLAVIAN INNOMINATE UNI R MOD SED  08/21/2017  . IR PERCUTANEOUS ART THROMBECTOMY/INFUSION INTRACRANIAL INC DIAG ANGIO  08/21/2017  . LOOP RECORDER INSERTION N/A 08/28/2017   Procedure: LOOP RECORDER INSERTION;  Surgeon: Regan Lemming, MD;  Location: [facility] INVASIVE CV LAB;  Service: Cardiovascular;  Laterality: N/A;  . RADIOLOGY WITH ANESTHESIA N/A 08/21/2017   Procedure: RADIOLOGY WITH ANESTHESIA;  Surgeon: Julieanne Cotton, MD;  Location: [facility] OR;  Service: Radiology;  Laterality: N/A;  . TEE WITHOUT CARDIOVERSION N/A 08/28/2017   Procedure: TRANSESOPHAGEAL ECHOCARDIOGRAM (TEE);  Surgeon: Pricilla Riffle, MD;  Location: [facility] ENDOSCOPY;  Service: Cardiovascular;  Laterality: N/A;    Assessment & Plan Clinical Impression: Patient is a 56 y.o. year old female  with recent admission to the hospital on 08/21/2017 to [facility] with right-sided weakness and slurred speech. CT of the head reviewed, showing left CVA.  Per report, small acute infarct left insula, left putamen and possibly left parietal cortex. Hyperdense distal left ICA and M1 segment consistent with thrombus. CT angiogram head and neck showed positive emergent large vessel occlusion. Severe atheromatous stenosis of the left ICA bulb felt to be embolic source. Patient did receive TPA. Patient transferred to [facility] underwent revascularization per interventional radiology. Latest MRI shows acute infarct left temporal operculum, left posterior insula, left posterior lentiform nuclear medicine extending into the corona radiata and caudate body. Biparietal white matter microhemorrhage felt to be chronic. Echocardiogram with ejection fraction of 60% no wall motion abnormalities.   .  Patient transferred to CIR on 08/28/2017 .    Patient currently requires max with basic self-care skills secondary to muscle weakness, impaired timing and sequencing, unbalanced muscle activation, motor apraxia and decreased coordination, decreased motor planning, decreased awareness, decreased problem solving, decreased safety awareness and decreased memory and decreased sitting balance, decreased standing balance, decreased postural control, hemiplegia and decreased balance strategies.  Prior to hospitalization, patient could complete ADLs with independent .  Patient will benefit from skilled intervention to decrease level of assist with basic self-care skills and increase independence with basic self-care skills prior to discharge home with care partner.  Anticipate patient will require minimal physical assistance and follow up home health.  OT - End of Session Activity Tolerance: Tolerates 30+ min activity with multiple rests Endurance Deficit: Yes Endurance Deficit Description: decreased OT  Assessment Rehab Potential (  ACUTE ONLY): Excellent OT Patient demonstrates impairments in the following area(s): Balance;Cognition;Motor;Safety OT Basic ADL's Functional Problem(s): Eating;Grooming;Toileting OT Advanced ADL's Functional Problem(s): Simple Meal Preparation OT Transfers Functional Problem(s): Tub/Shower;Toilet OT Additional Impairment(s): Fuctional Use of Upper Extremity OT Plan OT Intensity: Minimum of 1-2 x/day, 45 to 90 minutes OT Frequency: 5 out of 7 days OT Duration/Estimated Length of Stay: 18-21 days OT Treatment/Interventions: Balance/vestibular training;Cognitive remediation/compensation;Community reintegration;DME/adaptive equipment instruction;Discharge planning;Functional mobility training;Patient/family education;UE/LE Strength taining/ROM;Neuromuscular re-education;Therapeutic Exercise;Therapeutic Activities;Self Care/advanced ADL retraining;Functional electrical stimulation;UE/LE Coordination activities;Visual/perceptual remediation/compensation;Splinting/orthotics OT Self Feeding Anticipated Outcome(s): modified independence OT Basic Self-Care Anticipated Outcome(s): min assist to supervision OT Toileting Anticipated Outcome(s): min assist to supervision OT Bathroom Transfers Anticipated Outcome(s): min assist to supervision OT Recommendation Patient destination: Home Follow Up Recommendations: Home health OT Equipment Recommended: To be determined   Skilled Therapeutic Intervention Pt began working on selfcare retraining sit to stand on the EOB during session.  Mod assist for sit to stand, with max assist for peri care and for pulling items over hips this session secondary to right knee buckling with weightbearing.  Max hand over hand for use of the RUE as a stabilizer when opening deodorant or as an active assist for washing the RUE.  She needed mod to max assist for all dressing tasks secondary to not being able to dress the right side.  Slight impulsivity  noted with sit to stand and pt not waiting until therapist was in position to support the right side.  Max assist to complete stand pivot transfer to the wheelchair from the EOB.  Pt left in wheelchair with call button and phone in reach with safety belt in place.  Educated pt on need for assist with all transfers.  Half lap tray in place as well to support the RUE.   OT Evaluation Precautions/Restrictions  Precautions Precautions: Fall Precaution Comments: right hemiparesis Restrictions Weight Bearing Restrictions: No  Pain Pain Assessment Pain Assessment: No/denies pain Home Living/Prior Functioning Home Living Living Arrangements: Spouse/significant other Available Help at Discharge: Family, Available 24 hours/day Type of Home: House Home Access: Stairs to enter [employer] of Steps: 3 Entrance Stairs-Rails: None Home Layout: One level Bathroom Shower/Tub: Tub/shower unit, Health visitor: Pharmacist, community: Yes  Lives With: Spouse IADL History Homemaking Responsibilities: Yes Meal Prep Responsibility: Primary Laundry Responsibility: Primary Cleaning Responsibility: Primary Current License: Yes Mode of Transportation: Car Occupation: Full time employment 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 Prior Function Level of Independence: Independent with basic ADLs  Able to Take Stairs?: Yes Driving: Yes Vocation: Other (comment)(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 ) 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, takes care of mother  ADL  See Function Section of chart for details  Vision Baseline Vision/History: Wears glasses Wears Glasses: Reading only Patient Visual Report: Blurring of vision Vision Assessment?: Yes Eye Alignment: Impaired (comment) Alignment/Gaze Preference: Within Defined Limits Tracking/Visual Pursuits: (Decreased superior tracking in the right upper quadrant bilaterally) Convergence: Impaired  (comment)(Pt unable to converge eyes) Visual Fields: No apparent deficits Perception  Perception: Impaired Praxis Praxis: Impaired Praxis Impairment Details: Initiation;Motor planning Praxis-Other Comments: Decreased problem solving ability initially  when attmepting to squeeze out washcloth with the intact LUE.   Cognition Overall Cognitive Status: Impaired/Different from baseline Arousal/Alertness: Awake/alert Orientation Level: Person;Place;Situation Person: Oriented Place: Oriented Situation: Oriented Year: 2018 Month: December Day of Week: Correct Memory: Impaired Memory Impairment: Storage deficit;Decreased recall of new information Immediate Memory Recall: Sock;Blue;Bed Memory Recall: [facility] Memory Recall  Blue: With Cue Attention: Sustained;Selective Sustained Attention: Appears intact Selective Attention: Impaired Selective Attention Impairment: Functional basic Awareness: Impaired Awareness Impairment: Anticipatory impairment Problem Solving: Impaired Behaviors: Impulsive Safety/Judgment: Impaired Comments: Mildly impulsive behaviors observed Sensation Sensation Light Touch: Appears Intact Stereognosis: Not tested Hot/Cold: Not tested Proprioception: Appears Intact Additional Comments: Pt able to detect light touch appropriately as well as propriception in the RUE during testing. Coordination Gross Motor Movements are Fluid and Coordinated: No Fine Motor Movements are Fluid and Coordinated: No Coordination and Movement Description: Pt currently Brunnstrum stage II in the right arm and stage I in the hand.  Needs max hand over hand assistance to use as a stabilizer.  Motor  Motor Motor: Hemiplegia Motor - Skilled Clinical Observations: R hemi Mobility  Bed Mobility Bed Mobility: Rolling Right;Rolling Left;Supine to Sit;Sit to Supine Rolling Right: 5: Supervision Rolling Right Details: Verbal cues for technique Rolling Left: 4: Min assist Rolling Left  Details: Manual facilitation for weight shifting Supine to Sit: 4: Min assist Supine to Sit Details: Manual facilitation for weight shifting;Verbal cues for technique;Verbal cues for precautions/safety Sit to Supine: 4: Min assist Sit to Supine - Details: Manual facilitation for weight shifting;Verbal cues for precautions/safety;Verbal cues for technique Transfers Transfers: Sit to Stand Sit to Stand: With upper extremity assist;From bed;3: Mod assist Sit to Stand Details: Manual facilitation for weight shifting;Verbal cues for technique;Manual facilitation for placement;Manual facilitation for weight bearing Stand to Sit: With upper extremity assist;3: Mod assist Stand to Sit Details (indicate cue type and reason): Verbal cues for technique;Manual facilitation for weight shifting;Manual facilitation for placement;Manual facilitation for weight bearing  Trunk/Postural Assessment  Cervical Assessment Cervical Assessment: Within Functional Limits Thoracic Assessment Thoracic Assessment: Exceptions to WFL(rounded shoulders with increased thoracic elongation on the right and increased shortening on the left in sitting. ) Lumbar Assessment Lumbar Assessment: Exceptions to WFL(posterior pelvic titl at rest.) Postural Control Postural Control: Deficits on evaluation(decreased righting reactions R side)  Balance Balance Balance Assessed: Yes Static Sitting Balance Static Sitting - Balance Support: Left upper extremity supported;Feet supported Static Sitting - Level of Assistance: 5: Stand by assistance Dynamic Sitting Balance Dynamic Sitting - Balance Support: During functional activity Dynamic Sitting - Level of Assistance: 4: Min assist Static Standing Balance Static Standing - Balance Support: Left upper extremity supported;During functional activity Static Standing - Level of Assistance: 3: Mod assist Dynamic Standing Balance Dynamic Standing - Balance Support: During functional  activity Dynamic Standing - Level of Assistance: 2: Max assist Dynamic Standing - Comments: Pt with right knee bucklling and falling into flexion at the trunk during standing with LB selfcare tasks.   Extremity/Trunk Assessment RUE Assessment RUE Assessment: Exceptions to [facility] WFLS for all joints,  severe brusing noted in the right anterior and posterior shoulder, with noted swelling on the anterior part.  Brunnstrum stage I in the arm with trace shoulder internal rotation noted.  Stage I in the hand noted. ) LUE Assessment LUE Assessment: Within Functional Limits   See Function Navigator for Current Functional Status.   Refer to Care Plan for Long Term Goals  Recommendations for other services: Neuropsych   Discharge Criteria: Patient will be discharged from OT if patient refuses treatment 3 consecutive times without medical reason, if treatment goals not met, if there is a change in medical status, if patient makes no progress towards goals or if patient is discharged from hospital.  The above assessment, treatment plan, treatment alternatives and goals were discussed and mutually agreed upon: by patient  Arohi Salvatierra OTR/L 08/29/2017, 5:02 PM

## 2017-08-29 NOTE — Progress Notes (Signed)
Orthopedic Tech Progress Note Patient Details:  [name] [birth year] [id number]  Patient ID: [name], female   DOB: [birth year], 56 y.o.   MRN: [id number]   [name] 08/29/2017, 9:21 AM Called in hanger brace order; spoke with Caryl Pina

## 2017-08-29 NOTE — Anesthesia Postprocedure Evaluation (Signed)
Anesthesia Post Note  Patient: [name]  Procedure(s) Performed: TRANSESOPHAGEAL ECHOCARDIOGRAM (TEE) (N/A )     Patient location during evaluation: PACU Anesthesia Type: MAC Level of consciousness: awake and alert Pain management: pain level controlled Vital Signs Assessment: post-procedure vital signs reviewed and stable Respiratory status: spontaneous breathing Cardiovascular status: stable Anesthetic complications: no    Last Vitals:  Vitals:   08/28/17 1255 08/28/17 1413  BP: (!) 110/57 134/62  Pulse: 82 79  Resp: 17 18  Temp:  37.1 C  SpO2: 93% 95%    Last Pain:  Vitals:   08/28/17 1413  TempSrc: Oral  PainSc:                  Nolon Nations

## 2017-08-29 NOTE — Progress Notes (Signed)
[facility] PHYSICAL MEDICINE & REHABILITATION     PROGRESS NOTE  Subjective/Complaints:  Pt seen laying in bed this AM.  She slept well overnight.  She is unsure what she needs to do before therapies begin, educated patient.   ROS: Denies CP, SOB, N/V/D.  Objective: Vital Signs: Blood pressure 112/64, pulse 82, temperature 97.6 F (36.4 C), temperature source Oral, resp. rate 16, height 5\' 2"  (1.575 m), weight 58.9 kg (129 lb 13.6 oz), SpO2 93 %. No results found. Recent Labs    08/28/17 0429 08/29/17 0454  WBC 10.8* 9.7  HGB 13.5 14.0  HCT 40.4 41.7  PLT 397 403*   Recent Labs    08/28/17 0429 08/29/17 0454  NA 136 136  K 3.7 3.9  CL 105 100*  GLUCOSE 98 88  BUN 10 14  CREATININE 0.44 0.56  CALCIUM 8.7* 8.9   CBG (last 3)  No results for input(s): GLUCAP in the last 72 hours.  Wt Readings from Last 3 Encounters:  08/28/17 58.9 kg (129 lb 13.6 oz)  08/21/17 60.8 kg (134 lb)    Physical Exam:  BP 112/64 (BP Location: Left Arm)   Pulse 82   Temp 97.6 F (36.4 C) (Oral)   Resp 16   Ht 5\' 2"  (1.575 m)   Wt 58.9 kg (129 lb 13.6 oz)   LMP  (LMP Unknown)   SpO2 93%   BMI 23.75 kg/m  Constitutional: She appears well-developed and well-nourished.  HENT: Normocephalic and atraumatic.  Eyes: EOM are normal. No discharge.  Cardiovascular: Normal rate and regular rhythm. No JVD. Respiratory: Effort normal and breath sounds normal.  GI: Bowel sounds are normal. Non-distended Musculoskeletal: She exhibits no edema or tenderness.  Neurological: She is alert.  Right facial droop  A&Ox3 with increased time Word finding difficulty Motor: Right upper extremity 0/5 proximal to distal Right lower extremity 0/5 proximal to distal Sensation diminished to light touch RLE  Skin: Skin is warm and dry.  Psychiatric: Her affect is blunt. Her speech is delayed. She is slowed. Cognition and memory are impaired.   Assessment/Plan: 1. Functional deficits secondary to left MCA  infarct status post TPA and thrombectomy which require 3+ hours per day of interdisciplinary therapy in a comprehensive inpatient rehab setting. Physiatrist is providing close team supervision and 24 hour management of active medical problems listed below. Physiatrist and rehab team continue to assess barriers to discharge/monitor patient progress toward functional and medical goals.  Function:  Bathing Bathing position      Bathing parts      Bathing assist        Upper Body Dressing/Undressing Upper body dressing                    Upper body assist        Lower Body Dressing/Undressing Lower body dressing                                  Lower body assist        Toileting Toileting          Toileting assist     Transfers Chair/bed transfer             Locomotion Ambulation           Wheelchair          Cognition Comprehension Comprehension assist level: Understands basic 75 - 89% of the  time/ requires cueing 10 - 24% of the time  Expression Expression assist level: Expresses basic 75 - 89% of the time/requires cueing 10 - 24% of the time. Needs helper to occlude trach/needs to repeat words.  Social Interaction Social Interaction assist level: Interacts appropriately 75 - 89% of the time - Needs redirection for appropriate language or to initiate interaction.  Problem Solving    Memory Memory assist level: Recognizes or recalls 75 - 89% of the time/requires cueing 10 - 24% of the time    Medical Problem List and Plan: 1.  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 Status post loop recorder   Begin CIR   PRAFO/WHO ordered 2.  DVT Prophylaxis/Anticoagulation: SCDs. Bilateral lower extremity Dopplers negative 3. Pain Management: Tylenol as needed 4. Mood: Provide emotional support 5. Neuropsych: This patient is not fully capable of making decisions on her own  behalf. 6. Skin/Wound Care: Routine skin checks 7. Fluids/Electrolytes/Nutrition: Routine I&O's   BMP within acceptable range on 12/4 8. Tobacco abuse. Counseling/NicoDerm patch 9. Dysphagia. Follow-up speech therapy.   D3 thin, advance diet as tolerated 10. Hyperlipidemia. Lipitor 11. Hypertension. Lopressor 12.5 mg twice a day.    Monitor with increased mobility 12. Transaminitis   Mild with AST 45 on 12/4   Cont to monitor  LOS (Days) 1 A FACE TO FACE EVALUATION WAS PERFORMED  Skyrah Krupp Lorie Phenix 08/29/2017 8:18 AM

## 2017-08-29 NOTE — Progress Notes (Signed)
Physical Therapy Assessment and Plan  Patient Details  Name: [name] MRN: [id number] Date of Birth: [birth year]  PT Diagnosis: Abnormality of gait, Difficulty walking, Hemiplegia dominant, Impaired sensation and Muscle weakness Rehab Potential: Good ELOS: 14-18 days    Today's Date: 08/29/2017 PT Individual Time: 1300-1400 PT Individual Time Calculation (min): 60 min    Problem List:  Patient Active Problem List   Diagnosis Date Noted  . Transaminitis   . 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 Cerebrovascular accident (CVA) due to occlusion of left middle cerebral artery ([place])   . Right hemiplegia ([place])   . Dysphagia, post-stroke   . Tobacco abuse   . Hyperlipidemia   . Benign essential HTN   . Endotracheally intubated   . Acute respiratory insufficiency   . Acute embolic stroke ([place]) 38/32/9191    Past Medical History: No past medical history on file. Past Surgical History:  Past Surgical History:  Procedure Laterality Date  . IR ANGIO INTRA EXTRACRAN SEL COM CAROTID INNOMINATE UNI R MOD SED  08/21/2017  . IR ANGIO VERTEBRAL SEL SUBCLAVIAN INNOMINATE UNI R MOD SED  08/21/2017  . IR PERCUTANEOUS ART THROMBECTOMY/INFUSION INTRACRANIAL INC DIAG ANGIO  08/21/2017  . LOOP RECORDER INSERTION N/A 08/28/2017   Procedure: LOOP RECORDER INSERTION;  Surgeon: Constance Haw, MD;  Location: [place] [facility];  Service: Cardiovascular;  Laterality: N/A;  . RADIOLOGY WITH ANESTHESIA N/A 08/21/2017   Procedure: RADIOLOGY WITH ANESTHESIA;  Surgeon: Luanne Bras, MD;  Location: [place];  Service: Radiology;  Laterality: N/A;  . TEE WITHOUT CARDIOVERSION N/A 08/28/2017   Procedure: TRANSESOPHAGEAL ECHOCARDIOGRAM (TEE);  Surgeon: Fay Records, MD;  Location: [facility] ENDOSCOPY;  Service: Cardiovascular;  Laterality: N/A;    Assessment & Plan Clinical Impression: Patient is a 56 y.o.right handed femalewith unremarkable past medical history except tobacco abuse on  no prescription medications. Per chart review and family, patient lives with spouse. Independent prior to admission. Presented 08/21/2017 to [facility] hospital with right-sided weakness and slurred speech. CT of the head reviewed, showing left CVA.  Per report, small acute infarct left insula, left putamen and possibly left parietal cortex. Hyperdense distal left ICA and M1 segment consistent with thrombus. CT angiogram head and neck showed positive emergent large vessel occlusion. Severe atheromatous stenosis of the left ICA bulb felt to be embolic source. Patient did receive TPA. Patient transferred to [facility] underwent revascularization per interventional radiology. Latest MRI shows acute infarct left temporal operculum, left posterior insula, left posterior lentiform nuclear medicine extending into the corona radiata and caudate body. Biparietal white matter microhemorrhage felt to be chronic. Echocardiogram with ejection fraction of 60% no wall motion abnormalities. Lower extremity Dopplers negative for DVT. Neurology follow-up with TEE that showed LAA without masses no PFO and loop recorder placed 08/28/2017. Presently on aspirin for CVA prophylaxis. Dysphagia #3 thin liquid diet. Patient transferred to CIR on 08/28/2017 .   Patient currently requires max with mobility secondary to muscle weakness, impaired timing and sequencing, unbalanced muscle activation and decreased motor planning and decreased sitting balance, decreased standing balance, decreased postural control, hemiplegia and decreased balance strategies.  Prior to hospitalization, patient was independent  with mobility and lived with Spouse in a House home.  Home access is 3Stairs to enter.  Patient will benefit from skilled PT intervention to maximize safe functional mobility, minimize fall risk and decrease caregiver burden for planned discharge home with 24 hour assist.  Anticipate patient will benefit from  Physical Therapy Assessment and Plan  Patient Details  Name: [name] MRN: [id number] Date of Birth: [birth year]  PT Diagnosis: Abnormality of gait, Difficulty walking, Hemiplegia dominant, Impaired sensation and Muscle weakness Rehab Potential: Good ELOS: 14-18 days    Today's Date: 08/29/2017 PT Individual Time: 1300-1400 PT Individual Time Calculation (min): 60 min    Problem List:  Patient Active Problem List   Diagnosis Date Noted  . Transaminitis   . 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 Cerebrovascular accident (CVA) due to occlusion of left middle cerebral artery ([place])   . Right hemiplegia ([place])   . Dysphagia, post-stroke   . Tobacco abuse   . Hyperlipidemia   . Benign essential HTN   . Endotracheally intubated   . Acute respiratory insufficiency   . Acute embolic stroke ([place]) 38/32/9191    Past Medical History: No past medical history on file. Past Surgical History:  Past Surgical History:  Procedure Laterality Date  . IR ANGIO INTRA EXTRACRAN SEL COM CAROTID INNOMINATE UNI R MOD SED  08/21/2017  . IR ANGIO VERTEBRAL SEL SUBCLAVIAN INNOMINATE UNI R MOD SED  08/21/2017  . IR PERCUTANEOUS ART THROMBECTOMY/INFUSION INTRACRANIAL INC DIAG ANGIO  08/21/2017  . LOOP RECORDER INSERTION N/A 08/28/2017   Procedure: LOOP RECORDER INSERTION;  Surgeon: Constance Haw, MD;  Location: [place] [facility];  Service: Cardiovascular;  Laterality: N/A;  . RADIOLOGY WITH ANESTHESIA N/A 08/21/2017   Procedure: RADIOLOGY WITH ANESTHESIA;  Surgeon: Luanne Bras, MD;  Location: [place];  Service: Radiology;  Laterality: N/A;  . TEE WITHOUT CARDIOVERSION N/A 08/28/2017   Procedure: TRANSESOPHAGEAL ECHOCARDIOGRAM (TEE);  Surgeon: Fay Records, MD;  Location: [facility] ENDOSCOPY;  Service: Cardiovascular;  Laterality: N/A;    Assessment & Plan Clinical Impression: Patient is a 56 y.o.right handed femalewith unremarkable past medical history except tobacco abuse on  no prescription medications. Per chart review and family, patient lives with spouse. Independent prior to admission. Presented 08/21/2017 to [facility] hospital with right-sided weakness and slurred speech. CT of the head reviewed, showing left CVA.  Per report, small acute infarct left insula, left putamen and possibly left parietal cortex. Hyperdense distal left ICA and M1 segment consistent with thrombus. CT angiogram head and neck showed positive emergent large vessel occlusion. Severe atheromatous stenosis of the left ICA bulb felt to be embolic source. Patient did receive TPA. Patient transferred to [facility] underwent revascularization per interventional radiology. Latest MRI shows acute infarct left temporal operculum, left posterior insula, left posterior lentiform nuclear medicine extending into the corona radiata and caudate body. Biparietal white matter microhemorrhage felt to be chronic. Echocardiogram with ejection fraction of 60% no wall motion abnormalities. Lower extremity Dopplers negative for DVT. Neurology follow-up with TEE that showed LAA without masses no PFO and loop recorder placed 08/28/2017. Presently on aspirin for CVA prophylaxis. Dysphagia #3 thin liquid diet. Patient transferred to CIR on 08/28/2017 .   Patient currently requires max with mobility secondary to muscle weakness, impaired timing and sequencing, unbalanced muscle activation and decreased motor planning and decreased sitting balance, decreased standing balance, decreased postural control, hemiplegia and decreased balance strategies.  Prior to hospitalization, patient was independent  with mobility and lived with Spouse in a House home.  Home access is 3Stairs to enter.  Patient will benefit from skilled PT intervention to maximize safe functional mobility, minimize fall risk and decrease caregiver burden for planned discharge home with 24 hour assist.  Anticipate patient will benefit from  Physical Therapy Assessment and Plan  Patient Details  Name: [name] MRN: [id number] Date of Birth: [birth year]  PT Diagnosis: Abnormality of gait, Difficulty walking, Hemiplegia dominant, Impaired sensation and Muscle weakness Rehab Potential: Good ELOS: 14-18 days    Today's Date: 08/29/2017 PT Individual Time: 1300-1400 PT Individual Time Calculation (min): 60 min    Problem List:  Patient Active Problem List   Diagnosis Date Noted  . Transaminitis   . 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 Cerebrovascular accident (CVA) due to occlusion of left middle cerebral artery ([place])   . Right hemiplegia ([place])   . Dysphagia, post-stroke   . Tobacco abuse   . Hyperlipidemia   . Benign essential HTN   . Endotracheally intubated   . Acute respiratory insufficiency   . Acute embolic stroke ([place]) 38/32/9191    Past Medical History: No past medical history on file. Past Surgical History:  Past Surgical History:  Procedure Laterality Date  . IR ANGIO INTRA EXTRACRAN SEL COM CAROTID INNOMINATE UNI R MOD SED  08/21/2017  . IR ANGIO VERTEBRAL SEL SUBCLAVIAN INNOMINATE UNI R MOD SED  08/21/2017  . IR PERCUTANEOUS ART THROMBECTOMY/INFUSION INTRACRANIAL INC DIAG ANGIO  08/21/2017  . LOOP RECORDER INSERTION N/A 08/28/2017   Procedure: LOOP RECORDER INSERTION;  Surgeon: Constance Haw, MD;  Location: [place] [facility];  Service: Cardiovascular;  Laterality: N/A;  . RADIOLOGY WITH ANESTHESIA N/A 08/21/2017   Procedure: RADIOLOGY WITH ANESTHESIA;  Surgeon: Luanne Bras, MD;  Location: [place];  Service: Radiology;  Laterality: N/A;  . TEE WITHOUT CARDIOVERSION N/A 08/28/2017   Procedure: TRANSESOPHAGEAL ECHOCARDIOGRAM (TEE);  Surgeon: Fay Records, MD;  Location: [facility] ENDOSCOPY;  Service: Cardiovascular;  Laterality: N/A;    Assessment & Plan Clinical Impression: Patient is a 56 y.o.right handed femalewith unremarkable past medical history except tobacco abuse on  no prescription medications. Per chart review and family, patient lives with spouse. Independent prior to admission. Presented 08/21/2017 to [facility] hospital with right-sided weakness and slurred speech. CT of the head reviewed, showing left CVA.  Per report, small acute infarct left insula, left putamen and possibly left parietal cortex. Hyperdense distal left ICA and M1 segment consistent with thrombus. CT angiogram head and neck showed positive emergent large vessel occlusion. Severe atheromatous stenosis of the left ICA bulb felt to be embolic source. Patient did receive TPA. Patient transferred to [facility] underwent revascularization per interventional radiology. Latest MRI shows acute infarct left temporal operculum, left posterior insula, left posterior lentiform nuclear medicine extending into the corona radiata and caudate body. Biparietal white matter microhemorrhage felt to be chronic. Echocardiogram with ejection fraction of 60% no wall motion abnormalities. Lower extremity Dopplers negative for DVT. Neurology follow-up with TEE that showed LAA without masses no PFO and loop recorder placed 08/28/2017. Presently on aspirin for CVA prophylaxis. Dysphagia #3 thin liquid diet. Patient transferred to CIR on 08/28/2017 .   Patient currently requires max with mobility secondary to muscle weakness, impaired timing and sequencing, unbalanced muscle activation and decreased motor planning and decreased sitting balance, decreased standing balance, decreased postural control, hemiplegia and decreased balance strategies.  Prior to hospitalization, patient was independent  with mobility and lived with Spouse in a House home.  Home access is 3Stairs to enter.  Patient will benefit from skilled PT intervention to maximize safe functional mobility, minimize fall risk and decrease caregiver burden for planned discharge home with 24 hour assist.  Anticipate patient will benefit from

## 2017-08-29 NOTE — Progress Notes (Signed)
Social Work Assessment and Plan Social Work Assessment and Plan  Patient Details  Name: [name] MRN: [id number] Date of Birth: [birth year]  Today's Date: 08/29/2017  Problem List:  Patient Active Problem List   Diagnosis Date Noted  . Transaminitis   . 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 Cerebrovascular accident (CVA) due to occlusion of left middle cerebral artery ([place])   . Right hemiplegia ([place])   . Dysphagia, post-stroke   . Tobacco abuse   . Hyperlipidemia   . Benign essential HTN   . Endotracheally intubated   . Acute respiratory insufficiency   . Acute embolic stroke ([place]) 69/67/8938   Past Medical History: No past medical history on file. Past Surgical History:  Past Surgical History:  Procedure Laterality Date  . IR ANGIO INTRA EXTRACRAN SEL COM CAROTID INNOMINATE UNI R MOD SED  08/21/2017  . IR ANGIO VERTEBRAL SEL SUBCLAVIAN INNOMINATE UNI R MOD SED  08/21/2017  . IR PERCUTANEOUS ART THROMBECTOMY/INFUSION INTRACRANIAL INC DIAG ANGIO  08/21/2017  . LOOP RECORDER INSERTION N/A 08/28/2017   Procedure: LOOP RECORDER INSERTION;  Surgeon: Constance Haw, MD;  Location: [place] [facility];  Service: Cardiovascular;  Laterality: N/A;  . RADIOLOGY WITH ANESTHESIA N/A 08/21/2017   Procedure: RADIOLOGY WITH ANESTHESIA;  Surgeon: Luanne Bras, MD;  Location: [place];  Service: Radiology;  Laterality: N/A;  . TEE WITHOUT CARDIOVERSION N/A 08/28/2017   Procedure: TRANSESOPHAGEAL ECHOCARDIOGRAM (TEE);  Surgeon: Fay Records, MD;  Location: [facility] ENDOSCOPY;  Service: Cardiovascular;  Laterality: N/A;   Social History:  reports that she has been smoking.  she has never used smokeless tobacco. She reports that she does not use drugs. Her alcohol history is not on file.  Family / Support Systems Marital Status: Married Patient Roles: Spouse, Parent, Caregiver Spouse/Significant Other: Herbie [place] [phone] Children: Two son's 75 & 90   Other Supports: Mom and brother-[place] farm with them Anticipated Caregiver: Husband and brother Ability/Limitations of Caregiver: Husband and brother are retired and can provide assist-do help on the farm Caregiver Availability: 24/7 Family Dynamics: Close knit family all live on the same plot of land. 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 Pt has always been the one who has directed everyone and tokk charge. They seem to be floundering now and need her, but will need to figure it out.  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   Abuse/Neglect Abuse/Neglect Assessment Can Be Completed: Yes Physical Abuse: Denies Verbal Abuse: Denies Sexual Abuse: Denies Exploitation of patient/patient's resources: Denies Self-Neglect: Denies  Emotional Status Pt's affect, behavior adn adjustment status: Pt is motivated to do well and recover from this stroke. She has always been independent and cared for others and ran the farm. Her husband and brother are figuring it out and realizing how much she was doing. She has never had health issues and this is all new to her. Recent Psychosocial Issues: healthy prior to admission but also did not go to the MD prior to admission Pyschiatric History: No history deferred depression screening due to new to unit and verbalizing her concerns and plans. This worker does feel she  would benefit from seeing neruo-psych while here for coping. Husband may benefit from seeing with pt since having trouble coping with wife being her also. Substance Abuse History: Tobacco aware  needs to quit and the health risks it can cause. She is aware of the community resources available to her to quit  Patient / Family Perceptions, Expectations & Goals Pt/Family understanding of illness & functional limitations: Pt and husband can explain her stroke and deficits. Both talk with the MDS's and feel they have a good understanding of her treatment plan. Husband plans to be here and see her progress and provide support to her in this journey. Premorbid pt/family roles/activities: Wife, mother, farmer, caregiver, church member, sister, etc Anticipated changes in roles/activities/participation: resume Pt/family expectations/goals: Pt states: " I want to get back to the way I was and take care of things."  Husband states: " I will do whatever she needs me to do for her, she would for me."  [employer]: None Premorbid Home Care/DME Agencies: None Transportation available at discharge: [employer] referrals recommended: Neuropsychology, Support group (specify)  Discharge Planning Living Arrangements: Spouse/significant other Support Systems: Spouse/significant other, Children, Parent, Other relatives, Friends/neighbors, Social worker community Type of Residence: Private residence Insurance Resources: Self-pay(plans to apply for disability and Medicaid) Museum/gallery curator Resources: Employment, Secondary school teacher Screen Referred: Yes Living Expenses: Own Money Management: Patient, Spouse Does the patient have any problems obtaining your medications?: No Home Management: Patient Patient/Family Preliminary Plans: Return home with husband and other family members to assist with her care. All plan to be here daily and provide emotional support and see her progress while here. Pt hopes to do well here and recover, she realizes she has a lot of work to do. Social Work Anticipated Follow Up Needs: HH/OP, Support Group  Clinical Impression Pleasant motivated female  who has always done for others and is not used to this role. Family is very involved and will assist pt with her care if needed. Husband plans to be here daily and support her in her recovery. He is applying for disability and medicaid for her and has started the process. Had made neuro-psych referral for support and coping. Await therapy evaluations.  Elease Hashimoto 08/29/2017, 1:36 PM

## 2017-08-29 NOTE — Progress Notes (Signed)
Patient information reviewed and entered into eRehab system by Fahed Morten, RN, CRRN, PPS Coordinator.  Information including medical coding and functional independence measure will be reviewed and updated through discharge.    

## 2017-08-29 NOTE — Evaluation (Signed)
Speech Language Pathology Assessment and Plan  Patient Details  Name: [name] MRN: [id number] Date of Birth: 1961-06-15  SLP Diagnosis: Aphasia;Dysarthria;Dysphagia  Rehab Potential: Good ELOS: 21 days     Today's Date: 08/29/2017 SLP Individual Time: 0300-9233 SLP Individual Time Calculation (min): 58 min   Problem List:  Patient Active Problem List   Diagnosis Date Noted  . Transaminitis   . 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 Cerebrovascular accident (CVA) due to occlusion of left middle cerebral artery ([place])   . Right hemiplegia ([place])   . Dysphagia, post-stroke   . Tobacco abuse   . Hyperlipidemia   . Benign essential HTN   . Endotracheally intubated   . Acute respiratory insufficiency   . Acute embolic stroke ([place]) 00/76/2263   Past Medical History: No past medical history on file. Past Surgical History:  Past Surgical History:  Procedure Laterality Date  . IR ANGIO INTRA EXTRACRAN SEL COM CAROTID INNOMINATE UNI R MOD SED  08/21/2017  . IR ANGIO VERTEBRAL SEL SUBCLAVIAN INNOMINATE UNI R MOD SED  08/21/2017  . IR PERCUTANEOUS ART THROMBECTOMY/INFUSION INTRACRANIAL INC DIAG ANGIO  08/21/2017  . LOOP RECORDER INSERTION N/A 08/28/2017   Procedure: LOOP RECORDER INSERTION;  Surgeon: Constance Haw, MD;  Location: [place] [facility];  Service: Cardiovascular;  Laterality: N/A;  . RADIOLOGY WITH ANESTHESIA N/A 08/21/2017   Procedure: RADIOLOGY WITH ANESTHESIA;  Surgeon: Luanne Bras, MD;  Location: [place];  Service: Radiology;  Laterality: N/A;  . TEE WITHOUT CARDIOVERSION N/A 08/28/2017   Procedure: TRANSESOPHAGEAL ECHOCARDIOGRAM (TEE);  Surgeon: Fay Records, MD;  Location: [place];  Service: Cardiovascular;  Laterality: N/A;    Assessment / Plan / Recommendation Clinical Impression   Kaitlyn G Griffinis a 56 y.o.right handed femalewith unremarkable past medical history except tobacco abuse on no prescription medications. Per  chart review and family, patient lives with spouse. Independent prior to admission. Presented 08/21/2017 to [facility] hospital with right-sided weakness and slurred speech. CT of the head reviewed, showing left CVA.  Per report, small acute infarct left insula, left putamen and possibly left parietal cortex. Hyperdense distal left ICA and M1 segment consistent with thrombus. CT angiogram head and neck showed positive emergent large vessel occlusion. Severe atheromatous stenosis of the left ICA bulb felt to be embolic source. Patient did receive TPA. Patient transferred to [facility] underwent revascularization per interventional radiology. Latest MRI shows acute infarct left temporal operculum, left posterior insula, left posterior lentiform nuclear medicine extending into the corona radiata and caudate body. Biparietal white matter microhemorrhage felt to be chronic. Echocardiogram with ejection fraction of 60% no wall motion abnormalities. Lower extremity Dopplers negative for DVT. Neurology follow-up with TEE that showed LAA without masses no PFO and loop recorder placed 08/28/2017. Presently on aspirin for CVA prophylaxis. Dysphagia #3 thin liquid diet. Physical and occupational therapy evaluations completed 08/24/2017 with recommendations of physical medicine rehabilitation consult. Patient was admitted for a comprehensive rehabilitation program.  SLP evaluation completed on 08/29/2017 with the following results:   Pt presents with mild right sided oral motor weakness which impacts mastication and transit of boluses.  Pt verbally recalls the need to utilize chin tuck when consuming thin liquids but needs min-supervision cues for consistent use due to decreased selective attention in the presence of environmental distractions.  When pt does not utilize chin tuck she has immediate coughing which she can correctly attribute to forgetting her precautions.  Pt has trace residuals of  solids in the oral  cavity post swallow which clears with supervision cues for use of a liquid wash.  As a result, recommend that pt remain on dys 3, thin liquids with intermittent supervision for use of swallowing precautions.  Recommend that pt be advanced to meds whole in puree.  Pt also has a mild expressive aphasia characterized by semantic and phonemic paraphasic errors and difficulty with higher level word finding.  Pt needs min assist for mildly complex word finding at this time.  Furthermore, pt also has a mild dysarthria due to oral motor deficits mentioned above which impacts articulatory precision and intelligibility at the conversational level.  Pt currently needs supervision for slow rate and overarticulation to achieve intelligibility in conversations.  Cognition was only informally assessed to allow focus on linguistic function; however, pt presents with at least mild impairment in anticipatory awareness and selective attention to tasks.  Visual attention/scanning did not limit evaluation or appear obvious on any evaluation measures.   Given the deficits listed above, pt would benefit from skilled ST while inpatient in order to address cognitive-linguistic as well as swallowing function.  Anticipate that pt will need 24/7 supervision at discharge in addition to [name] follow up at next level of care.       Skilled Therapeutic Interventions          Cognitive-linguistic and bedside swallow evaluation completed with results and recommendations reviewed with patient .     SLP Assessment  Patient will need skilled Speech Lanaguage Pathology Services during CIR admission    Recommendations  SLP Diet Recommendations: Dysphagia 3 (Mech soft);Thin Medication Administration: Whole meds with puree Supervision: Intermittent supervision to cue for compensatory strategies Compensations: Slow rate;Small sips/bites Postural Changes and/or Swallow Maneuvers: Chin tuck Oral Care Recommendations: Oral care  BID Recommendations for Other Services: Neuropsych consult Patient destination: Home Follow up Recommendations: 24 hour supervision/assistance;Home Health SLP;Outpatient SLP Equipment Recommended: None recommended by SLP    SLP Frequency 3 to 5 out of 7 days   SLP Duration  SLP Intensity  SLP Treatment/Interventions 21 days   Minumum of 1-2 x/day, 30 to 90 minutes  Cueing hierarchy;Functional tasks;Patient/family education;Environmental controls;Dysphagia/aspiration precaution training;Internal/external aids    Pain Pain Assessment Pain Assessment: No/denies pain  Prior Functioning Cognitive/Linguistic Baseline: Within functional limits Type of Home: House  Lives With: Spouse Available Help at Discharge: Family;Available 24 hours/day 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 Needs helper to occlude trach/needs to repeat words.  Social Interaction Social Interaction assist level: Interacts appropriately 90% of the time - Needs monitoring or encouragement for participation or interaction.  Problem Solving Problem solving assist level: Solves basic 75 - 89% of the time/requires cueing 10 - 24% of the time  Memory Memory assist level: Recognizes or recalls 75 - 89% of the time/requires cueing 10 - 24% of the time   Short Term Goals: Week 1: SLP Short Term Goal 1 (Week 1): Pt will use compensatory strategies to facilitate mildly complex word finding at the conversational level with supervision verbal cues.  SLP Short  Term Goal 2 (Week 1): Pt will consume  therapeutic trials of regular textures with mod I use of swallowing precautions to clear residue from the oral cavity.   SLP Short Term Goal 3 (Week 1): Pt will utilize chin tuck when consuming thin liquids with mod I in >75% of opportunities  SLP Short Term Goal 4 (Week 1): Pt will slow rate and overarticulate to achieve intelligibility at the conversational level with mod I  SLP Short Term Goal 5 (Week 1): Pt will identify at least 2 potential barriers to discharge and generate solutions with min question cues.    Refer to Care Plan for Long Term Goals  Recommendations for other services: Neuropsych  Discharge Criteria: Patient will be discharged from SLP if patient refuses treatment 3 consecutive times without medical reason, if treatment goals not met, if there is a change in medical status, if patient makes no progress towards goals or if patient is discharged from hospital.  The above assessment, treatment plan, treatment alternatives and goals were discussed and mutually agreed upon: by patient  Emilio Math 08/29/2017, 9:38 AM

## 2017-08-30 ENCOUNTER — Inpatient Hospital Stay (HOSPITAL_COMMUNITY): Payer: Self-pay | Admitting: Occupational Therapy

## 2017-08-30 ENCOUNTER — Inpatient Hospital Stay (HOSPITAL_COMMUNITY): Payer: Self-pay | Admitting: Speech Pathology

## 2017-08-30 ENCOUNTER — Encounter (HOSPITAL_COMMUNITY): Payer: Self-pay

## 2017-08-30 ENCOUNTER — Other Ambulatory Visit: Payer: Self-pay

## 2017-08-30 ENCOUNTER — Inpatient Hospital Stay (HOSPITAL_COMMUNITY): Payer: Self-pay

## 2017-08-30 DIAGNOSIS — L231 Allergic contact dermatitis due to adhesives: Secondary | ICD-10-CM

## 2017-08-30 LAB — BETA-2-GLYCOPROTEIN I ABS, IGG/M/A
Beta-2 Glyco I IgG: <9 GPI IgG units (ref 0–20)
Beta-2-Glycoprotein I IgA: <9 GPI IgA units (ref 0–25)
Beta-2-Glycoprotein I IgM: <9 GPI IgM units (ref 0–32)

## 2017-08-30 MED ORDER — HYDROXYZINE HCL 25 MG PO TABS
25.0000 mg | ORAL_TABLET | Freq: Three times a day (TID) | ORAL | Status: DC | PRN
  Administered 2017-08-30 – 2017-09-02 (×6): 25 mg via ORAL
  Filled 2017-08-30 (×9): qty 1

## 2017-08-30 MED ORDER — DIPHENHYDRAMINE-ZINC ACETATE 2-0.1 % EX CREA
TOPICAL_CREAM | Freq: Three times a day (TID) | CUTANEOUS | Status: DC
  Administered 2017-08-30 (×2): via TOPICAL
  Administered 2017-09-01: 1 via TOPICAL
  Filled 2017-08-30: qty 28

## 2017-08-30 MED ORDER — DIPHENHYDRAMINE HCL 25 MG PO CAPS
25.0000 mg | ORAL_CAPSULE | Freq: Once | ORAL | Status: AC
  Administered 2017-08-30: 25 mg via ORAL
  Filled 2017-08-30: qty 1

## 2017-08-30 NOTE — Plan of Care (Signed)
Pt has raised weal type rash on buttocks, thighs, back, groin, and abdomen Denies any pain at present

## 2017-08-30 NOTE — Progress Notes (Signed)
Occupational Therapy Session Note  Patient Details  Name: [name] MRN: [id number] Date of Birth: 03-Apr-1961  Today's Date: 08/30/2017 OT Individual Time: 1300-1345 OT Individual Time Calculation (min): 45 min    Short Term Goals: Week 1:  OT Short Term Goal 1 (Week 1): Pt will complete UB dressing with min assist to donn pullover shirt.  OT Short Term Goal 2 (Week 1): Pt will complete LB bathing sit to stand with min assist.  OT Short Term Goal 3 (Week 1): Pt will complete LB dressing sit to stand with min assist following hemi technique.  OT Short Term Goal 4 (Week 1): Pt will complete toilet transfers with min assist stand pivot.  OT Short Term Goal 5 (Week 1): Pt will use the RUE as a stabilizer for selfcare tasks with mod assist and no more than min instructional cueing to initiate.    Skilled Therapeutic Interventions/Progress Updates:    1:1 Neuro muscular reeducation with right UE focus on activation proximally. Started in supine for gravity eliminated plane. Pt able to illicit movement with shoulder horizontal movement, elevation and flexion and extension. Trace in bicep and tricep. Also performed in sidelying and sitting with UE Ranger- mod a for support with activation of mm. Left in w/c with quick release and chair alarm.  Therapy Documentation Precautions:  Precautions Precautions: Fall Precaution Comments: right hemiparesis Restrictions Weight Bearing Restrictions: No    Pain: No c/o pain other than itching bilateral hips   Other Treatments:    See Function Navigator for Current Functional Status.   Therapy/Group: Individual Therapy  Willeen Cass [facility] 08/30/2017, 1:58 PM

## 2017-08-30 NOTE — Significant Event (Signed)
Pt awakens with c/o hives/itching, additional hives noted to folds to abdomen/buttock crease and pt c/o ear lobes swelling, denies taking new medication, denies sob or throat swelling or itching.  Cleansed skin with warm water and hypoallergenic linens applied to pt and bed.  Hydrocortisone applied to hives and earlobes.

## 2017-08-30 NOTE — Progress Notes (Signed)
[name] PHYSICAL MEDICINE & REHABILITATION     PROGRESS NOTE  Subjective/Complaints:  Patient seen lying in bed this morning. She slept well overnight. She was noted to have a rash along her diaper area yesterday and started on medication.  ROS: + Rash. Denies CP, SOB, N/V/D.  Objective: Vital Signs: Blood pressure 120/68, pulse 79, temperature 98.2 F (36.8 C), temperature source Oral, resp. rate 16, height 5\' 2"  (1.575 m), weight 58.9 kg (129 lb 13.6 oz), SpO2 95 %. No results found. Recent Labs    08/28/17 0429 08/29/17 0454  WBC 10.8* 9.7  HGB 13.5 14.0  HCT 40.4 41.7  PLT 397 403*   Recent Labs    08/28/17 0429 08/29/17 0454  NA 136 136  K 3.7 3.9  CL 105 100*  GLUCOSE 98 88  BUN 10 14  CREATININE 0.44 0.56  CALCIUM 8.7* 8.9   CBG (last 3)  No results for input(s): GLUCAP in the last 72 hours.  Wt Readings from Last 3 Encounters:  08/28/17 58.9 kg (129 lb 13.6 oz)  08/21/17 60.8 kg (134 lb)    Physical Exam:  BP 120/68 (BP Location: Left Arm)   Pulse 79   Temp 98.2 F (36.8 C) (Oral)   Resp 16   Ht 5\' 2"  (1.575 m)   Wt 58.9 kg (129 lb 13.6 oz)   LMP  (LMP Unknown)   SpO2 95%   BMI 23.75 kg/m  Constitutional: She appears well-developed and well-nourished.  HENT: Normocephalic and atraumatic.  Eyes: EOM are normal. No discharge.  Cardiovascular: RRR. No JVD. Respiratory: Effort normal and breath sounds normal.  GI: Bowel sounds are normal. Non-distended Musculoskeletal: She exhibits no edema or tenderness.  Neurological: She is alert.  Right facial droop  A&Ox3 with increased time Word finding difficulty Motor: Right upper extremity 0/5 proximal to distal (unchanged) Right lower extremity 0/5 proximal to distal Sensation diminished to light touch RLE  Skin: Skin is warm and dry.  Psychiatric: Her affect is blunt. Her speech is delayed. She is slowed. Cognition and memory are impaired.   Assessment/Plan: 1. Functional deficits secondary  to left MCA infarct status post TPA and thrombectomy which require 3+ hours per day of interdisciplinary therapy in a comprehensive inpatient rehab setting. Physiatrist is providing close team supervision and 24 hour management of active medical problems listed below. Physiatrist and rehab team continue to assess barriers to discharge/monitor patient progress toward functional and medical goals.  Function:  Bathing Bathing position   Position: Sitting EOB  Bathing parts Body parts bathed by patient: Left arm, Chest, Abdomen, Right upper leg, Left lower leg Body parts bathed by helper: Right lower leg, Back, Front perineal area, Buttocks  Bathing assist        Upper Body Dressing/Undressing Upper body dressing   What is the patient wearing?: Pull over shirt/dress     Pull over shirt/dress - Perfomed by patient: Thread/unthread left sleeve, Put head through opening Pull over shirt/dress - Perfomed by helper: Pull shirt over trunk, Thread/unthread right sleeve        Upper body assist        Lower Body Dressing/Undressing Lower body dressing   What is the patient wearing?: Pants, Non-skid slipper socks     Pants- Performed by patient: Thread/unthread left pants leg Pants- Performed by helper: Thread/unthread right pants leg, Pull pants up/down Non-skid slipper socks- Performed by patient: Don/doff left sock Non-skid slipper socks- Performed by helper: Don/doff right sock  Lower body assist        Toileting Toileting   Toileting steps completed by patient: Adjust clothing prior to toileting, Performs perineal hygiene, Adjust clothing after toileting Toileting steps completed by helper: Adjust clothing prior to toileting, Performs perineal hygiene, Adjust clothing after toileting Toileting Assistive Devices: Grab bar or rail  Toileting assist Assist level: Touching or steadying assistance (Pt.75%)   Transfers Chair/bed transfer   Chair/bed transfer  method: Stand pivot Chair/bed transfer assist level: Maximal assist (Pt 25 - 49%/lift and lower) Chair/bed transfer assistive device: Armrests     Locomotion Ambulation     Max distance: 30' Assist level: Maximal assist (Pt 25 - 49%)   Wheelchair   Type: Manual Max wheelchair distance: 150' Assist Level: Supervision or verbal cues  Cognition Comprehension Comprehension assist level: Understands basic 90% of the time/cues < 10% of the time  Expression Expression assist level: Expresses basic 75 - 89% of the time/requires cueing 10 - 24% of the time. Needs helper to occlude trach/needs to repeat words.  Social Interaction Social Interaction assist level: Interacts appropriately 75 - 89% of the time - Needs redirection for appropriate language or to initiate interaction.  Problem Solving Problem solving assist level: Solves basic 50 - 74% of the time/requires cueing 25 - 49% of the time  Memory Memory assist level: Recognizes or recalls 25 - 49% of the time/requires cueing 50 - 75% of the time    Medical Problem List and Plan: 1.  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 Status post loop recorder   Cont CIR   PRAFO/WHO  2.  DVT Prophylaxis/Anticoagulation: SCDs. Bilateral lower extremity Dopplers negative 3. Pain Management: Tylenol as needed 4. Mood: Provide emotional support 5. Neuropsych: This patient is not fully capable of making decisions on her own behalf. 6. Skin/Wound Care: Routine skin checks 7. Fluids/Electrolytes/Nutrition: Routine I&O's   BMP within acceptable range on 12/4 8. Tobacco abuse. Counseling/NicoDerm patch 9. Dysphagia. Follow-up speech therapy.   D3 thin, advance diet as tolerated 10. Hyperlipidemia. Lipitor 11. Hypertension. Lopressor 12.5 mg twice a day.    Controlled on 12/5  12. Transaminitis   Mild with AST 45 on 12/4   Cont to monitor 13. Contact dermatitis   Benadryl cream  ordered   LOS (Days) 2 A FACE TO FACE EVALUATION WAS PERFORMED  [name] 08/30/2017 8:24 AM

## 2017-08-30 NOTE — Patient Care Conference (Signed)
Inpatient RehabilitationTeam Conference and Plan of Care Update Date: 08/30/2017   Time: 2:15 PM    Patient Name: [name]      Medical Record Number: [id number]  Date of Birth: [birth year] Sex: Female         Room/Bed: 4M02C/4M02C-01 Payor Info: Payor: MEDICAID POTENTIAL / Plan: MEDICAID POTENTIAL / Product Type: *No Product type* /    Admitting Diagnosis: L CVA  Admit Date/Time:  08/28/2017  5:51 PM Admission Comments: No comment available   Primary Diagnosis:  <principal problem not specified> Principal Problem: <principal problem not specified>  Patient Active Problem List   Diagnosis Date Noted  . Allergic contact dermatitis due to adhesives   . Transaminitis   . 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 Cerebrovascular accident (CVA) due to occlusion of left middle cerebral artery ([facility])   . Right hemiplegia ([facility])   . Dysphagia, post-stroke   . Tobacco abuse   . Hyperlipidemia   . Benign essential HTN   . Endotracheally intubated   . Acute respiratory insufficiency   . Acute embolic stroke ([facility]) 08/21/2017    Expected Discharge Date: Expected Discharge Date: 09/14/17  Team Members Present: Physician leading conference: Dr. Maryla Morrow Social Worker Present: Dossie Der, LCSW Nurse Present: Chrissie Noa, RN PT Present: Woodfin Ganja, PT OT Present: Perrin Maltese, OT SLP Present: Feliberto Gottron, SLP PPS Coordinator present : Tora Duck, RN, CRRN     Current Status/Progress Goal Weekly Team Focus  Medical   Right hemiplegia, dysphagia, hypophonia secondary to acute left MCA infarct status post TPA and thrombectomy.   Improve mobility, safety, dysphagia, dermatitis  See above   Bowel/Bladder   continent of bowel and bladder can be incontinent at times LBM 12-3  Continent of bowel and bladder, maintain regular bowel pattern  Assist with tolieting needs    Swallow/Nutrition/ Hydration   Dys 3, thin liquids; intermittent supervision   mod I    trials of regular textures   ADL's   Pt currently needs mod assist for UB selfcare with mod to max for LB selfcare.  Max hand over hand for RUE movement with only trace movement noted in the shoulder.  Max assist for stand pivot transfers with mod assist for squat pivot.   supervision overall   selfcare retraining, balance retraining, transfer training, neuromuscular re-education, pt/family education   Mobility   Mod A transfers, Max A gait 30' rail in hallway w/ w/c follow, supervision w/c  Min A gait, supervision transfers  transfers, gait, safety awareness   Communication   Min assist, dysarthric and higher level word finding deficits   mod I   education and carryover of compensatory strategies    Safety/Cognition/ Behavioral Observations  min assist  supervision  safety awareness, anticipatory awareness    Pain   no c/o pain   no c/o pain   Assess pain q shift and prn   Skin   rash to groin, bottom, left breast, back hydrocortisone in effective  improvemnent in rash no new skin issues  Assess skin q shift and prn, apply rash treatment as ordered      *See Care Plan and progress notes for long and short-term goals.     Barriers to Discharge  Current Status/Progress Possible Resolutions Date Resolved   Physician    Medical stability     See above  Therapies, meds for dermatitis, follow labs      Nursing  PT  Inaccessible home environment;Home environment access/layout;Behavior  family very supportive and able to provide 24/7 assist, 3 steps to enter home w/ no rails, mild impusivity noted              OT                  SLP                SW                Discharge Planning/Teaching Needs:  Home with husband and brother to assist with her care. Were here yesterday to observe in therapies.       Team Discussion:  Goals supervision-min assist level. Impulsive and safety awareness issues. Dys 3 thin doing well with chin tuck. Some hip flexion coming back.  MD treating contact dermitis. Family very supportive and involved.  Revisions to Treatment Plan:  DC 12/20    Continued Need for Acute Rehabilitation Level of Care: The patient requires daily medical management by a physician with specialized training in physical medicine and rehabilitation for the following conditions: Daily direction of a multidisciplinary physical rehabilitation program to ensure safe treatment while eliciting the highest outcome that is of practical value to the patient.: Yes Daily medical management of patient stability for increased activity during participation in an intensive rehabilitation regime.: Yes Daily analysis of laboratory values and/or radiology reports with any subsequent need for medication adjustment of medical intervention for : Neurological problems;Other  Aina Rossbach, Lemar Livings 08/30/2017, 3:55 PM

## 2017-08-30 NOTE — Progress Notes (Signed)
Occupational Therapy Session Note  Patient Details  Name: [name] MRN: [id number] Date of Birth: 11-08-1960  Today's Date: 08/30/2017 OT Individual Time: 0901-1000 OT Individual Time Calculation (min): 59 min    Short Term Goals: Week 1:  OT Short Term Goal 1 (Week 1): Pt will complete UB dressing with min assist to donn pullover shirt.  OT Short Term Goal 2 (Week 1): Pt will complete LB bathing sit to stand with min assist.  OT Short Term Goal 3 (Week 1): Pt will complete LB dressing sit to stand with min assist following hemi technique.  OT Short Term Goal 4 (Week 1): Pt will complete toilet transfers with min assist stand pivot.  OT Short Term Goal 5 (Week 1): Pt will use the RUE as a stabilizer for selfcare tasks with mod assist and no more than min instructional cueing to initiate.    Skilled Therapeutic Interventions/Progress Updates:    Pt completed shower and dressing during session.  Max hand over hand assistance needed for washing the LUE using the RUE.  Mod assist for sit to stand when washing peri area as well as mod assist for crossing the RLE over the left knee and maintaining during bathing and dressing of the RLE.  Max assist for stand pivot transfers to the tub bench and from the tub bench.  Supervision with increased time for donning pullover shirt following hemi techniques.  Educated pt on self AAROM exercises for shoulder and elbow flexion as well as wrist and digit flexion/extension.  Pt left up in wheelchair with belt and chair alarm in place and half lap tray supporting the RUE.    Therapy Documentation Precautions:  Precautions Precautions: Fall Precaution Comments: right hemiparesis Restrictions Weight Bearing Restrictions: No   Pain: Pain Assessment Pain Assessment: No/denies pain ADL: See Function Navigator for Current Functional Status.   Therapy/Group: Individual Therapy  Gretna Bergin OTR/L 08/30/2017, 12:33 PM

## 2017-08-30 NOTE — Progress Notes (Signed)
Speech Language Pathology Daily Session Note  Patient Details  Name: [name] MRN: [id number] Date of Birth: 1960/12/08  Today's Date: 08/30/2017 SLP Individual Time: 1110-1205 SLP Individual Time Calculation (min): 55 min  Short Term Goals: Week 1: SLP Short Term Goal 1 (Week 1): Pt will use compensatory strategies to facilitate mildly complex word finding at the conversational level with supervision verbal cues.  SLP Short Term Goal 2 (Week 1): Pt will consume therapeutic trials of regular textures with mod I use of swallowing precautions to clear residue from the oral cavity.   SLP Short Term Goal 3 (Week 1): Pt will utilize chin tuck when consuming thin liquids with mod I in >75% of opportunities  SLP Short Term Goal 4 (Week 1): Pt will slow rate and overarticulate to achieve intelligibility at the conversational level with mod I  SLP Short Term Goal 5 (Week 1): Pt will identify at least 2 potential barriers to discharge and generate solutions with min question cues.    Skilled Therapeutic Interventions:  Pt was seen for skilled ST targeting goals for dysphagia and communication.  Pt was transferred to wheelchair from bed with min-mod assist multimodal cues for safety due to impulsivity with mobility.  Pt reports that she "got in trouble" yesterday for getting out of bed unassisted.  Therapist facilitated the session with a functional snack of regular textures to continue working towards diet progression.  Pt consumed advanced textures with supervision for use of swallowing precautions to clear minimal residuals from oral cavity.  During functional conversations with therapist pt needed min assist verbal cues for awareness of verbal errors and word finding.  Therapist provided skilled education regarding compensatory word finding strategies.  Pt was returned to room and left in wheelchair with husband at bedside.  Continue per current plan of care.    Function:  Eating Eating    Modified Consistency Diet: No(trials of regular textures with SLP ) Eating Assist Level: Set up assist for;Supervision or verbal cues   Eating Set Up Assist For: Opening containers       Cognition Comprehension Comprehension assist level: Follows basic conversation/direction with extra time/assistive device  Expression   Expression assist level: Expresses basic 75 - 89% of the time/requires cueing 10 - 24% of the time. Needs helper to occlude trach/needs to repeat words.  Social Interaction Social Interaction assist level: Interacts appropriately 75 - 89% of the time - Needs redirection for appropriate language or to initiate interaction.  Problem Solving Problem solving assist level: Solves basic 75 - 89% of the time/requires cueing 10 - 24% of the time  Memory Memory assist level: Recognizes or recalls 50 - 74% of the time/requires cueing 25 - 49% of the time    Pain Pain Assessment Pain Assessment: No/denies pain  Therapy/Group: Individual Therapy  Jezebelle Ledwell, Selinda Orion 08/30/2017, 2:31 PM

## 2017-08-30 NOTE — Progress Notes (Signed)
Physical Therapy Session Note  Patient Details  Name: [name] MRN: [id number] Date of Birth: 1961/07/21  Today's Date: 08/30/2017 PT Individual Time: 0802-0900 PT Individual Time Calculation (min): 58 min   Short Term Goals: Week 1:  PT Short Term Goal 1 (Week 1): Pt will ambulate 27' w/ LRAD w/ Mod A PT Short Term Goal 2 (Week 1): Pt will perform bed<>chair transfers w/ Min A  PT Short Term Goal 3 (Week 1): Pt will maintain dynamic standing balance w/ Min A  PT Short Term Goal 4 (Week 1): Pt will perform bed mobility w/ supervision   Skilled Therapeutic Interventions/Progress Updates:    Pt supine in bed upon PT arrival, agreeable to therapy tx and denies pina. PT performed upper and lower body dressing while supine in bed, min assist to help loop R LE and R UE through clothing. Pt transferred from supine>sitting EOB with min assist, verbal cues for techniques. Pt transferred from bed>w/c with min assist, squat pivot transfer, verbal cues for safety and techniques. Pt propelled w/c from room<>gym x 150 ft each direction using L LE. Pt practiced squat pivot transfers x 3 from w/c<>mat with emphasis on proper set up, w/c parts management and techniques. Pt performed sit<>stands with mod assist and L UE support for balance, working on symmetric weightbearing and hip extension, poor quad activation requiring R knee block. Pt transferred sitting<>supine with min assist and verbal cues for R UE awareness. In sidelying pt worked on gravity eliminated R  LE strengthening/neuromuscular re-ed, using the powder board pt performed 2 x 10 active assisted hip flexion and hip extension with focus on isolated muscle activation. In supine with R LE bent pt performed 1 x 10 hip abduction against manual resistance for glute medius activation and NMR. Pt performed x 4 sit<>stand from mat without UE support in standing, emphasis on equal weightbearing and static standing balance. Pt propelled back to room and  left seated in w/c with needs in reach and chair alarm/QRB in place.   Therapy Documentation Precautions:  Precautions Precautions: Fall Precaution Comments: right hemiparesis Restrictions Weight Bearing Restrictions: No   See Function Navigator for Current Functional Status.   Therapy/Group: Individual Therapy  Netta Corrigan, PT, DPT 08/30/2017, 9:01 AM

## 2017-08-30 NOTE — Progress Notes (Signed)
Pt calls with c/o worsening of itching, hives have gotten larger and more pronounced, tylenol given, call to [place], [place], left message on vm to call back

## 2017-08-31 ENCOUNTER — Other Ambulatory Visit: Payer: Self-pay

## 2017-08-31 ENCOUNTER — Inpatient Hospital Stay (HOSPITAL_COMMUNITY): Payer: Self-pay | Admitting: Speech Pathology

## 2017-08-31 ENCOUNTER — Inpatient Hospital Stay (HOSPITAL_COMMUNITY): Payer: Self-pay | Admitting: Physical Therapy

## 2017-08-31 ENCOUNTER — Inpatient Hospital Stay (HOSPITAL_COMMUNITY): Payer: Self-pay

## 2017-08-31 ENCOUNTER — Inpatient Hospital Stay (HOSPITAL_COMMUNITY): Payer: Self-pay | Admitting: Occupational Therapy

## 2017-08-31 DIAGNOSIS — R1310 Dysphagia, unspecified: Secondary | ICD-10-CM

## 2017-08-31 MED ORDER — PREDNISONE 20 MG PO TABS
40.0000 mg | ORAL_TABLET | Freq: Every day | ORAL | Status: DC
  Administered 2017-08-31 – 2017-09-01 (×2): 40 mg via ORAL
  Filled 2017-08-31 (×2): qty 2

## 2017-08-31 NOTE — Progress Notes (Signed)
Occupational Therapy Session Note  Patient Details  Name: [name] MRN: [id number] Date of Birth: 23-Mar-1961  Today's Date: 08/31/2017 OT Individual Time: 1502-1530 OT Individual Time Calculation (min): 28 min    Short Term Goals: Week 1:  OT Short Term Goal 1 (Week 1): Pt will complete UB dressing with min assist to donn pullover shirt.  OT Short Term Goal 2 (Week 1): Pt will complete LB bathing sit to stand with min assist.  OT Short Term Goal 3 (Week 1): Pt will complete LB dressing sit to stand with min assist following hemi technique.  OT Short Term Goal 4 (Week 1): Pt will complete toilet transfers with min assist stand pivot.  OT Short Term Goal 5 (Week 1): Pt will use the RUE as a stabilizer for selfcare tasks with mod assist and no more than min instructional cueing to initiate.    Skilled Therapeutic Interventions/Progress Updates:    Pt received supine in bed, reporting nausea. With encouragement Pt agreeable to OT tx session from bed level, providing cool washcloth and made comfortable at start of session. Pt engaged in PROM across all planes to RUE with trace movement noted in biceps. Educated Pt on self-ROM with Pt return demonstrating with MinA/supervision for completion. Pt completed AAROM to RLE of knee flexion/extension. Pt completes bed mobility to change bed pad with supervision for rolling to L and R. Pt left supine in bed, bed alarm set, call bell and needs within reach. Pt reporting nausea symptoms subsiding throughout session completion.   Therapy Documentation Precautions:  Precautions Precautions: Fall Precaution Comments: right hemiparesis Restrictions Weight Bearing Restrictions: No    See Function Navigator for Current Functional Status.   Therapy/Group: Individual Therapy  Raymondo Band 08/31/2017, 5:04 PM

## 2017-08-31 NOTE — Progress Notes (Signed)
Physical Therapy Session Note  Patient Details  Name: [name] MRN: [id number] Date of Birth: [birth year]  Today's Date: 08/31/2017 PT Individual Time: 1300-1355 PT Individual Time Calculation (min): 55 min   Short Term Goals: Week 1:  PT Short Term Goal 1 (Week 1): Pt will ambulate 42' w/ LRAD w/ Mod A PT Short Term Goal 2 (Week 1): Pt will perform bed<>chair transfers w/ Min A  PT Short Term Goal 3 (Week 1): Pt will maintain dynamic standing balance w/ Min A  PT Short Term Goal 4 (Week 1): Pt will perform bed mobility w/ supervision   Skilled Therapeutic Interventions/Progress Updates:   Pt in w/c upon arrival and agreeable to therapy, no c/o pain. Pt self-propelled w/c to/from gym w/ supervision using L hemi technique to work on independence w/ functional mobility, occasional cues for technique. Worked on [employer] in gym in seated and standing. Performed sit<>stands from mat w/ min assist for blocking R knee and facilitation for weight shifting to RLE in stance. Performed terminal knee extension 3x10 w/ RLE, L forward steps 3x10, and lateral weight shifts w/ manual facilitation of R quad activation for all movements. LUE support on hemiwalker during all activity and min guard for static standing balance. Instructed pt on using hemiwalker for stand pivot transfer w/ min assist overall and returned to room. Ended session in w/c and in care of family, all needs met.   Therapy Documentation Precautions:  Precautions Precautions: Fall Precaution Comments: right hemiparesis Restrictions Weight Bearing Restrictions: No  See Function Navigator for Current Functional Status.   Therapy/Group: Individual Therapy  [name] 08/31/2017, 2:17 PM

## 2017-08-31 NOTE — Progress Notes (Signed)
Occupational Therapy Session Note  Patient Details  Name: [name] MRN: [id number] Date of Birth: [birth year]  Today's Date: 08/31/2017 OT Individual Time: 1100-1155 OT Individual Time Calculation (min): 55 min    Short Term Goals: Week 1:  OT Short Term Goal 1 (Week 1): Pt will complete UB dressing with min assist to donn pullover shirt.  OT Short Term Goal 2 (Week 1): Pt will complete LB bathing sit to stand with min assist.  OT Short Term Goal 3 (Week 1): Pt will complete LB dressing sit to stand with min assist following hemi technique.  OT Short Term Goal 4 (Week 1): Pt will complete toilet transfers with min assist stand pivot.  OT Short Term Goal 5 (Week 1): Pt will use the RUE as a stabilizer for selfcare tasks with mod assist and no more than min instructional cueing to initiate.    Skilled Therapeutic Interventions/Progress Updates:    Pt resting in bed upon arrival and agreeable to therapy.  Pt required min A and max verbal cues for sequencing for supine (HOB elevated)>sit EOB in preparation for transfer to w/c (mod A for stand pivot tranfser). Pt transferred to tub transfer bench with mod A.  Pt required max verbal cues for attention to RUE and safety awareness with sitting balance. Pt required min a for sit<>stand in shower and mod A for standing balance to initiate bathing buttocks.  Pt required max verbal cues to recall hemi bathing strategies and HOH A to complete task.  Pt required max verbal cues to recall hemi dressing techniques and assistance with threading RUE and RLE, in addition to donning R shoe.  Pt performed sit<>stand from w/c with min A and mod A for standing balance to pull up pants.  Focus on BADL retraining, attention to RUE/R envirionment, functional transfers, sit<>stand, sitting/standing balance, and safety awareness to increase independence with BADLs.  Pt remained in w/c with chair alarm activated, QRB in place, half lap tray in place, and all needs within  reach.   Therapy Documentation Precautions:  Precautions Precautions: Fall Precaution Comments: right hemiparesis Restrictions Weight Bearing Restrictions: No Pain: Pain Assessment Pain Assessment: No/denies pain Pain Score: 0-No pain  See Function Navigator for Current Functional Status.   Therapy/Group: Individual Therapy  Leroy Libman 08/31/2017, 12:04 PM

## 2017-08-31 NOTE — Progress Notes (Signed)
[facility] [place] PHYSICAL MEDICINE & REHABILITATION     PROGRESS NOTE  Subjective/Complaints:  Patient seen lying in bed this morning. She states she slept well overnight. She complains about itching.  ROS: +Rash. Denies CP, SOB, N/V/D.  Objective: Vital Signs: Blood pressure (!) 94/48, pulse 97, temperature 98.3 F (36.8 C), temperature source Oral, resp. rate 18, height 5\' 2"  (1.575 m), weight 58.9 kg (129 lb 13.6 oz), SpO2 98 %. No results found. Recent Labs    08/29/17 0454  WBC 9.7  HGB 14.0  HCT 41.7  PLT 403*   Recent Labs    08/29/17 0454  NA 136  K 3.9  CL 100*  GLUCOSE 88  BUN 14  CREATININE 0.56  CALCIUM 8.9   CBG (last 3)  No results for input(s): GLUCAP in the last 72 hours.  Wt Readings from Last 3 Encounters:  08/28/17 58.9 kg (129 lb 13.6 oz)  08/21/17 60.8 kg (134 lb)    Physical Exam:  BP (!) 94/48 (BP Location: Left Arm)   Pulse 97   Temp 98.3 F (36.8 C) (Oral)   Resp 18   Ht 5\' 2"  (1.575 m)   Wt 58.9 kg (129 lb 13.6 oz)   LMP  (LMP Unknown)   SpO2 98%   BMI 23.75 kg/m  Constitutional: She appears well-developed and well-nourished.  HENT: Normocephalic and atraumatic.  Eyes: EOM are normal. No discharge.  Cardiovascular: RRR. No JVD. Respiratory: Effort normal and breath sounds normal.  GI: Bowel sounds are normal. Non-distended Musculoskeletal: She exhibits no edema or tenderness.  Neurological: She is alert.  Right facial droop  A&Ox3 with increased time Word finding difficulty Motor: Right upper extremity 0/5 proximal to distal (stable) Right lower extremity 0/5 proximal to distal Sensation diminished to light touch RLE  Skin: Rash spreading  Psychiatric: Her affect is blunt. Her speech is delayed. She is slowed. Cognition and memory are impaired.   Assessment/Plan: 1. 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 Physiatrist is providing close team supervision and 24 hour management of active medical problems listed below. Physiatrist and rehab team continue to assess barriers to discharge/monitor patient progress toward functional and medical goals.  Function:  Bathing Bathing position   Position: Shower  Bathing parts Body parts bathed by patient: Right arm, Chest, Abdomen, Front perineal area, Right upper leg, Left upper leg, Left lower leg, Back Body parts bathed by helper: Left arm, Buttocks, Right lower leg  Bathing assist        Upper Body Dressing/Undressing Upper body dressing   What is the patient wearing?: Pull over shirt/dress     Pull over shirt/dress - Perfomed by patient: Thread/unthread left sleeve, Put head through opening, Thread/unthread right sleeve Pull over shirt/dress - Perfomed by helper: Pull shirt over trunk, Thread/unthread right sleeve        Upper body assist Assist Level: Supervision or verbal cues      Lower Body Dressing/Undressing Lower body dressing   What is the patient wearing?: Pants, Non-skid slipper socks     Pants- Performed by patient: Thread/unthread left pants leg Pants- Performed by helper: Thread/unthread right pants leg, Pull pants up/down Non-skid slipper socks- Performed by patient: Don/doff right sock Non-skid slipper socks- Performed by helper: Don/doff left sock                  Lower body  assist        Toileting Toileting   Toileting steps completed by patient: Adjust clothing prior to toileting, Performs perineal hygiene, Adjust clothing after toileting Toileting steps completed by helper: Adjust clothing prior to toileting, Performs perineal hygiene, Adjust clothing after toileting Toileting Assistive Devices: Grab bar or rail  Toileting assist Assist level: Touching or steadying assistance (Pt.75%)   Transfers Chair/bed transfer   Chair/bed transfer method: Squat pivot Chair/bed transfer  assist level: Touching or steadying assistance (Pt > 75%) Chair/bed transfer assistive device: Armrests     Locomotion Ambulation     Max distance: 30' Assist level: Maximal assist (Pt 25 - 49%)   Wheelchair   Type: Manual Max wheelchair distance: 150' Assist Level: Supervision or verbal cues  Cognition Comprehension Comprehension assist level: Follows basic conversation/direction with extra time/assistive device  Expression Expression assist level: Expresses basic 75 - 89% of the time/requires cueing 10 - 24% of the time. Needs helper to occlude trach/needs to repeat words.  Social Interaction Social Interaction assist level: Interacts appropriately 75 - 89% of the time - Needs redirection for appropriate language or to initiate interaction.  Problem Solving Problem solving assist level: Solves basic 75 - 89% of the time/requires cueing 10 - 24% of the time  Memory Memory assist level: Recognizes or recalls 50 - 74% of the time/requires cueing 25 - 49% of the time    Medical Problem List and Plan: 1.  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 Status post loop recorder   Cont CIR   PRAFO/WHO  2.  DVT Prophylaxis/Anticoagulation: SCDs. Bilateral lower extremity Dopplers negative 3. Pain Management: Tylenol as needed 4. Mood: Provide emotional support 5. Neuropsych: This patient is not fully capable of making decisions on her own behalf. 6. Skin/Wound Care: Routine skin checks 7. Fluids/Electrolytes/Nutrition: Routine I&O's   BMP within acceptable range on 12/4 8. Tobacco abuse. Counseling/NicoDerm patch 9. Dysphagia. Follow-up speech therapy.   D3 thin, advance diet as tolerated 10. Hyperlipidemia. Lipitor 11. Hypertension. Lopressor 12.5 mg twice a day.    Overall controlled on 12/6  12. Transaminitis   Mild with AST 45 on 12/4   Labs ordered for tomorrow   Cont to monitor 13. Contact dermatitis    Spreading   Tried benadryl cream, steroid cream, benadryl PO   Discussed with pharmacy, potential allergy to ASA > Metoprolol   Will order oral prednisone  LOS (Days) 3 A FACE TO FACE EVALUATION WAS PERFORMED  Ivon Oelkers Lorie Phenix 08/31/2017 8:49 AM

## 2017-08-31 NOTE — Progress Notes (Signed)
Speech Language Pathology Daily Session Note  Patient Details  Name: [name] MRN: [id number] Date of Birth: 01-10-1961  Today's Date: 08/31/2017 SLP Individual Time: 4888-9169 SLP Individual Time Calculation (min): 40 min  Short Term Goals: Week 1: SLP Short Term Goal 1 (Week 1): Pt will use compensatory strategies to facilitate mildly complex word finding at the conversational level with supervision verbal cues.  SLP Short Term Goal 2 (Week 1): Pt will consume therapeutic trials of regular textures with mod I use of swallowing precautions to clear residue from the oral cavity.   SLP Short Term Goal 3 (Week 1): Pt will utilize chin tuck when consuming thin liquids with mod I in >75% of opportunities  SLP Short Term Goal 4 (Week 1): Pt will slow rate and overarticulate to achieve intelligibility at the conversational level with mod I  SLP Short Term Goal 5 (Week 1): Pt will identify at least 2 potential barriers to discharge and generate solutions with min question cues.    Skilled Therapeutic Interventions:  Pt was seen for skilled ST targeting communication goals. Pt needed min assist verbal cues to recall word finding compensatory strategies from yesterday's therapy session.  She was able to utilize strategies during a novel verbal description task with min assist verbal cues.  Pt was returned to room and left in wheelchair with husband at bedside.  Continue per current plan of care.    Function:  Eating Eating                 Cognition Comprehension Comprehension assist level: Follows basic conversation/direction with extra time/assistive device  Expression   Expression assist level: Expresses basic 75 - 89% of the time/requires cueing 10 - 24% of the time. Needs helper to occlude trach/needs to repeat words.  Social Interaction Social Interaction assist level: Interacts appropriately 90% of the time - Needs monitoring or encouragement for participation or interaction.   Problem Solving Problem solving assist level: Solves basic 75 - 89% of the time/requires cueing 10 - 24% of the time  Memory      Pain Pain Assessment Pain Assessment: No/denies pain  Therapy/Group: Individual Therapy  Calirose Mccance, Selinda Orion 08/31/2017, 8:53 PM

## 2017-08-31 NOTE — Progress Notes (Signed)
Social Work Patient ID: [name], female   DOB: 08-19-1961, 56 y.o.   MRN: [id number]  Met with pt and husband-Bobby who was here to discuss team conference goals supervision-min assist and target discharge date 12/20. She is hoping once her skin issues are resolved. She is trying but becomes tearful due to all that has happened and feels she needs to be home with her Mom and mother in-law who is ill with cancer. Discussed pt focusing on her recovery and this will better able her to assist them and the rest of the family are assisting them. Will have neuro-psych see for support and provide support also. Work on discharge needs. Husband meeting with [facility] today to start disability application.

## 2017-09-01 ENCOUNTER — Inpatient Hospital Stay (HOSPITAL_COMMUNITY): Payer: Self-pay | Admitting: Occupational Therapy

## 2017-09-01 ENCOUNTER — Inpatient Hospital Stay (HOSPITAL_COMMUNITY): Payer: Self-pay | Admitting: Physical Therapy

## 2017-09-01 ENCOUNTER — Inpatient Hospital Stay (HOSPITAL_COMMUNITY): Payer: Self-pay | Admitting: Speech Pathology

## 2017-09-01 DIAGNOSIS — E46 Unspecified protein-calorie malnutrition: Secondary | ICD-10-CM

## 2017-09-01 DIAGNOSIS — T783XXA Angioneurotic edema, initial encounter: Secondary | ICD-10-CM

## 2017-09-01 DIAGNOSIS — T783XXD Angioneurotic edema, subsequent encounter: Secondary | ICD-10-CM

## 2017-09-01 DIAGNOSIS — I10 Essential (primary) hypertension: Secondary | ICD-10-CM

## 2017-09-01 DIAGNOSIS — E8809 Other disorders of plasma-protein metabolism, not elsewhere classified: Secondary | ICD-10-CM

## 2017-09-01 DIAGNOSIS — D72828 Other elevated white blood cell count: Secondary | ICD-10-CM

## 2017-09-01 DIAGNOSIS — D72829 Elevated white blood cell count, unspecified: Secondary | ICD-10-CM

## 2017-09-01 LAB — CBC WITH DIFFERENTIAL/PLATELET
BASOS ABS: 0 10*3/uL (ref 0.0–0.1)
Basophils Relative: 0 %
EOS ABS: 0.1 10*3/uL (ref 0.0–0.7)
EOS PCT: 1 %
HCT: 41.3 % (ref 36.0–46.0)
Hemoglobin: 14.3 g/dL (ref 12.0–15.0)
Lymphocytes Relative: 26 %
Lymphs Abs: 3.2 10*3/uL (ref 0.7–4.0)
MCH: 32.5 pg (ref 26.0–34.0)
MCHC: 34.6 g/dL (ref 30.0–36.0)
MCV: 93.9 fL (ref 78.0–100.0)
Monocytes Absolute: 1.2 10*3/uL — ABNORMAL HIGH (ref 0.1–1.0)
Monocytes Relative: 9 %
Neutro Abs: 7.8 10*3/uL — ABNORMAL HIGH (ref 1.7–7.7)
Neutrophils Relative %: 64 %
PLATELETS: 463 10*3/uL — AB (ref 150–400)
RBC: 4.4 MIL/uL (ref 3.87–5.11)
RDW: 13.1 % (ref 11.5–15.5)
WBC: 12.3 10*3/uL — AB (ref 4.0–10.5)

## 2017-09-01 LAB — COMPREHENSIVE METABOLIC PANEL
ALK PHOS: 82 U/L (ref 38–126)
ALT: 27 U/L (ref 14–54)
AST: 35 U/L (ref 15–41)
Albumin: 3.1 g/dL — ABNORMAL LOW (ref 3.5–5.0)
Anion gap: 8 (ref 5–15)
BILIRUBIN TOTAL: 0.6 mg/dL (ref 0.3–1.2)
BUN: 14 mg/dL (ref 6–20)
CALCIUM: 9.2 mg/dL (ref 8.9–10.3)
CO2: 28 mmol/L (ref 22–32)
CREATININE: 0.59 mg/dL (ref 0.44–1.00)
Chloride: 101 mmol/L (ref 101–111)
GFR calc Af Amer: >60 mL/min (ref >60)
Glucose, Bld: 92 mg/dL (ref 65–99)
Potassium: 3.7 mmol/L (ref 3.5–5.1)
Sodium: 137 mmol/L (ref 135–145)
TOTAL PROTEIN: 6.2 g/dL — AB (ref 6.5–8.1)

## 2017-09-01 MED ORDER — WHITE PETROLATUM EX OINT
TOPICAL_OINTMENT | CUTANEOUS | Status: AC
  Administered 2017-09-01: 02:00:00
  Filled 2017-09-01: qty 28.35

## 2017-09-01 MED ORDER — PRO-STAT SUGAR FREE PO LIQD
30.0000 mL | Freq: Two times a day (BID) | ORAL | Status: DC
  Administered 2017-09-01: 30 mL via ORAL
  Filled 2017-09-01: qty 30

## 2017-09-01 MED ORDER — DIPHENHYDRAMINE HCL 25 MG PO CAPS
25.0000 mg | ORAL_CAPSULE | ORAL | Status: AC
  Administered 2017-09-01: 25 mg via ORAL
  Filled 2017-09-01: qty 1

## 2017-09-01 MED ORDER — PREDNISONE 20 MG PO TABS
40.0000 mg | ORAL_TABLET | Freq: Two times a day (BID) | ORAL | Status: DC
  Administered 2017-09-01 – 2017-09-05 (×8): 40 mg via ORAL
  Filled 2017-09-01 (×2): qty 2
  Filled 2017-09-01: qty 4
  Filled 2017-09-01 (×5): qty 2

## 2017-09-01 NOTE — Progress Notes (Signed)
Speech Language Pathology Daily Session Note  Patient Details  Name: [name] MRN: [id number] Date of Birth: Sep 23, 1961  Today's Date: 09/01/2017 SLP Individual Time: 1003-1100 SLP Individual Time Calculation (min): 57 min  Short Term Goals: Week 1: SLP Short Term Goal 1 (Week 1): Pt will use compensatory strategies to facilitate mildly complex word finding at the conversational level with supervision verbal cues.  SLP Short Term Goal 2 (Week 1): Pt will consume therapeutic trials of regular textures with mod I use of swallowing precautions to clear residue from the oral cavity.   SLP Short Term Goal 3 (Week 1): Pt will utilize chin tuck when consuming thin liquids with mod I in >75% of opportunities  SLP Short Term Goal 4 (Week 1): Pt will slow rate and overarticulate to achieve intelligibility at the conversational level with mod I  SLP Short Term Goal 5 (Week 1): Pt will identify at least 2 potential barriers to discharge and generate solutions with min question cues.    Skilled Therapeutic Interventions:  Pt was seen for skilled ST targeting cognitive goals.  Pt reports having a difficulty night and morning due to ongoing issues regarding an allergic reaction to medications.  Pt reported that she was willing to participate in speech therapy and that she "felt bad" for missing her AM PT session.  Therapist facilitated the session with medication management tasks to address ongoing diagnostic treatment for recall of new information and problem solving.  Pt was able to recall function of medications when named in 2 out of 3 opportunities with min question cues.  Pt then organized pills into a twice daily pill box for 100% accuracy with supervision assist  To correct errors.  Pt was left in wheelchair with husband at bedside.  Continue per current plan of care.      Function:  Eating Eating                 Cognition Comprehension Comprehension assist level: Follows basic  conversation/direction with no assist  Expression   Expression assist level: Expresses basic 75 - 89% of the time/requires cueing 10 - 24% of the time. Needs helper to occlude trach/needs to repeat words.  Social Interaction Social Interaction assist level: Interacts appropriately 90% of the time - Needs monitoring or encouragement for participation or interaction.  Problem Solving Problem solving assist level: Solves basic 90% of the time/requires cueing < 10% of the time  Memory Memory assist level: Recognizes or recalls 75 - 89% of the time/requires cueing 10 - 24% of the time    Pain Pain Assessment Pain Assessment: No/denies pain   Therapy/Group: Individual Therapy  Gesselle Fitzsimons, Selinda Orion 09/01/2017, 11:24 AM

## 2017-09-01 NOTE — Progress Notes (Signed)
Occupational Therapy Session Note  Patient Details  Name: [name] MRN: [id number] Date of Birth: 12-Sep-1961  Today's Date: 09/01/2017 OT Individual Time: 1100-1200 OT Individual Time Calculation (min): 60 min    Short Term Goals: Week 1:  OT Short Term Goal 1 (Week 1): Pt will complete UB dressing with min assist to donn pullover shirt.  OT Short Term Goal 2 (Week 1): Pt will complete LB bathing sit to stand with min assist.  OT Short Term Goal 3 (Week 1): Pt will complete LB dressing sit to stand with min assist following hemi technique.  OT Short Term Goal 4 (Week 1): Pt will complete toilet transfers with min assist stand pivot.  OT Short Term Goal 5 (Week 1): Pt will use the RUE as a stabilizer for selfcare tasks with mod assist and no more than min instructional cueing to initiate.    Skilled Therapeutic Interventions/Progress Updates:    Pt completed shower transfer by ambulating from the wheelchair at bedside to the shower bench with max assist.  Max facilitation needed to stabilize the right knee and hip for mobility.  Once on the shower seat she was able to remove clothing with min assist sit to stand.  She completed bathing with min assist for UB and and LB sit to stand.  Had her transfer to the wheelchair and position herself in front of the sink for dressing tasks.  Mod demonstrational cueing with supervision for UB dressing.  Mod assist for LB dressing following hemi techniques.  Worked on standing at the sink with RUE in weightbearing to finish session.  Mod facilitation to keep right knee and hip extended while reaching across her body with the LUE or while attempting toe taps or stepping forward/backwards with the LLE.  Pt left in room with visitor and spouse at end of session.  No belt in place as pt has supervision.  Told family if they leave to notify nursing to belt and alarm can be placed.  Pt, visitor, and spouse voiced understanding.    Therapy  Documentation Precautions:  Precautions Precautions: Fall Precaution Comments: right hemiparesis Restrictions Weight Bearing Restrictions: No  Pain: Pain Assessment Pain Assessment: No/denies pain ADL: See Function Navigator for Current Functional Status.   Therapy/Group: Individual Therapy  Peytan Andringa OTR/L 09/01/2017, 12:49 PM

## 2017-09-01 NOTE — Plan of Care (Addendum)
Pt's contact dermatitis is slowly improving Vistaril given for itching Denies any pain Pt sad, beginning to cope with life changes

## 2017-09-01 NOTE — Progress Notes (Signed)
Occupational Therapy Session Note  Patient Details  Name: [name] MRN: [id number] Date of Birth: 03/06/1961  Today's Date: 09/01/2017 OT Individual Time: 2633-3545 OT Individual Time Calculation (min): 50 min    Short Term Goals: Week 1:  OT Short Term Goal 1 (Week 1): Pt will complete UB dressing with min assist to donn pullover shirt.  OT Short Term Goal 2 (Week 1): Pt will complete LB bathing sit to stand with min assist.  OT Short Term Goal 3 (Week 1): Pt will complete LB dressing sit to stand with min assist following hemi technique.  OT Short Term Goal 4 (Week 1): Pt will complete toilet transfers with min assist stand pivot.  OT Short Term Goal 5 (Week 1): Pt will use the RUE as a stabilizer for selfcare tasks with mod assist and no more than min instructional cueing to initiate.    Skilled Therapeutic Interventions/Progress Updates:    Pt worked on Brewing technologist for the RUE and trunk during session.  Mod assist needed for stand pivot transfers from the wheelchair to mat and back.  She was able to work on weightbearing thru the RUE on the mat from sitting position, while reaching across her body with the LUE.  Min assist needed to return to midline after reaching task.  Minimal activity noted in the RUE when attempting to use it to assist with return back to midline.  Used tilted stool for pt to work on active shoulder and elbow AROM.  She was able to push the stool to target with supervision, but needed min assist from therapist to avoid trunk compensations.  Returned to bed at end of session with min assist for transition from sitting to supine.    Therapy Documentation Precautions:  Precautions Precautions: Fall Precaution Comments: right hemiparesis Restrictions Weight Bearing Restrictions: No   Pain: Pain Assessment Pain Assessment: No/denies pain ADL: See Function Navigator for Current Functional Status.   Therapy/Group: Individual  Therapy  Siaosi Alter OTR/L 09/01/2017, 4:09 PM

## 2017-09-01 NOTE — Progress Notes (Signed)
Physical Therapy Session Note  Patient Details  Name: [name] MRN: [id number] Date of Birth: 1961/08/31  Today's Date: 09/01/2017 PT Individual Time: 1330(make up time)-1345 PT Individual Time Calculation (min): 15 min   Short Term Goals: Week 1:  PT Short Term Goal 1 (Week 1): Pt will ambulate 48' w/ LRAD w/ Mod A PT Short Term Goal 2 (Week 1): Pt will perform bed<>chair transfers w/ Min A  PT Short Term Goal 3 (Week 1): Pt will maintain dynamic standing balance w/ Min A  PT Short Term Goal 4 (Week 1): Pt will perform bed mobility w/ supervision   Skilled Therapeutic Interventions/Progress Updates:   Pt supine upon arrival and agreeable to therapy, husband and pt requesting assistance w/ toileting and for husband to be checked off w/ transfer. Pt transferred to EOB w/ supervision and performed bed<>chair transfer w/ min assist from PT via squad pivot. Instructed husband through transfer and husband performed transfer w/c to/from commode in bathroom safely via squat pivot safely. Pt able to remove LE garments independently in seated on commode. Husband additionally practiced another transfer going w/c<>bed w/o cues from therapist for safe transfer. Husband practiced squat pivot transfers in each direction, hemi and non-hemi side. Both appreciative of education. Ended session sitting upright in w/c and in care of family, all needs met.   Therapy Documentation Precautions:  Precautions Precautions: Fall Precaution Comments: right hemiparesis Restrictions Weight Bearing Restrictions: No Pain: Pain Assessment Pain Assessment: No/denies pain  See Function Navigator for Current Functional Status.   Therapy/Group: Individual Therapy  Varun Jourdan K Arnette 09/01/2017, 4:28 PM

## 2017-09-01 NOTE — Progress Notes (Signed)
End of shift summary,  At 0530, VSS, call to PA to see pt as hive to left eye has now swollen eye shut, worsening of hive to right foot and states both feet feel swollen as her lips and tongue feel swollen, too. Elwin Mocha, comes over around [phone] and verbal to give prednisone 40 mg now instead of at 0900.

## 2017-09-01 NOTE — Progress Notes (Signed)
RN at bedside this am with pt. Pt crying and verbalizes intense itching. Pt has hives on abdomen, legs, and feet. Pt's left eye still moderately swollen although she is able to open it when speaking with RN. Pt given prn medications to help ease itching. Will continue to monitor.

## 2017-09-01 NOTE — Progress Notes (Signed)
At start of shift, pt without c/o itching, hives were lessening and pink.  An hour and 20 minutes after metoprolol was given, pt having new hives occur and c/o of severe itching to left thigh area, several hives noted, 1-2 cm in size.  Vistaril was given as ordered.  At 0145 pt c/o no relief from vistaril and lips feeling swollen and hive noted to left eyelid, increased redness to hives to left leg and c/o top of feet being itchy and one large hive noted to right top of foot.  Benadryl cream to hive areas on left leg and right foot. Ice pack to feet.  Call to Linna Hoff A., PA on call, order for benadryl 25 mg po.  Call to pharmacy, spoke with pharmacist,Gregory Abbott, states potential for allergy to metoprolol or lipitor and states that RN can request MD to change these to alternatives, will relay information in report, pt states she doesn't want to take all these meds as she is afraid they are causing her problems, encouraged pt to discuss with Dr Posey Pronto to devise a plan that she would feel comfortable with.

## 2017-09-01 NOTE — Progress Notes (Signed)
[place] PHYSICAL MEDICINE & REHABILITATION     PROGRESS NOTE  Subjective/Complaints:  Pt seen laying in bed this AM. She did not sleep well last night due to pain and itching. Her rash is spreading, discussed with nursing.  ROS: +Rash. Denies CP, SOB, N/V/D.  Objective: Vital Signs: Blood pressure (!) 117/93, pulse 84, temperature 98 F (36.7 C), temperature source Oral, resp. rate 20, height 5\' 2"  (1.575 m), weight 58.9 kg (129 lb 13.6 oz), SpO2 95 %. No results found. Recent Labs    09/01/17 0525  WBC 12.3*  HGB 14.3  HCT 41.3  PLT 463*   Recent Labs    09/01/17 0525  NA 137  K 3.7  CL 101  GLUCOSE 92  BUN 14  CREATININE 0.59  CALCIUM 9.2   CBG (last 3)  No results for input(s): GLUCAP in the last 72 hours.  Wt Readings from Last 3 Encounters:  08/28/17 58.9 kg (129 lb 13.6 oz)  08/21/17 60.8 kg (134 lb)    Physical Exam:  BP (!) 117/93 (BP Location: Left Arm)   Pulse 84   Temp 98 F (36.7 C) (Oral)   Resp 20   Ht 5\' 2"  (1.575 m)   Wt 58.9 kg (129 lb 13.6 oz)   LMP  (LMP Unknown)   SpO2 95%   BMI 23.75 kg/m  Constitutional: She appears well-developed and well-nourished.  HENT: Atraumatic. Facial edema Eyes: EOM are normal. No discharge.  Cardiovascular: RRR. No JVD. Respiratory: Effort normal and breath sounds normal.  GI: Bowel sounds are normal. Non-distended Musculoskeletal: She exhibits no edema or tenderness.  Neurological: She is alert.  Right facial droop  A&Ox3 with increased time Word finding difficulty Motor: Right upper extremity 0/5 proximal to distal (stable) Right lower extremity 0/5 proximal to distal Sensation diminished to light touch RLE  Skin: Rash continues to spread  Psychiatric: Her affect is blunt. Her speech is delayed. She is slowed. Cognition and memory are impaired.   Assessment/Plan: 1. Functional deficits secondary to left MCA infarct status post TPA and thrombectomy which require 3+ hours per day of  interdisciplinary therapy in a comprehensive inpatient rehab setting. Physiatrist is providing close team supervision and 24 hour management of active medical problems listed below. Physiatrist and rehab team continue to assess barriers to discharge/monitor patient progress toward functional and medical goals.  Function:  Bathing Bathing position   Position: Shower  Bathing parts Body parts bathed by patient: Right arm, Chest, Abdomen, Front perineal area, Right upper leg, Left upper leg, Left lower leg, Back Body parts bathed by helper: Left arm, Buttocks, Right lower leg  Bathing assist        Upper Body Dressing/Undressing Upper body dressing   What is the patient wearing?: Pull over shirt/dress     Pull over shirt/dress - Perfomed by patient: Thread/unthread left sleeve, Put head through opening, Pull shirt over trunk Pull over shirt/dress - Perfomed by helper: Pull shirt over trunk, Thread/unthread right sleeve        Upper body assist Assist Level: Touching or steadying assistance(Pt > 75%)      Lower Body Dressing/Undressing Lower body dressing   What is the patient wearing?: Pants, Shoes     Pants- Performed by patient: Thread/unthread left pants leg Pants- Performed by helper: Thread/unthread right pants leg, Pull pants up/down Non-skid slipper socks- Performed by patient: Don/doff right sock Non-skid slipper socks- Performed by helper: Don/doff left sock     Shoes - Performed by  patient: Don/doff left shoe Shoes - Performed by helper: Don/doff right shoe          Lower body assist Assist for lower body dressing: (2/5, 40%, max assist)      Toileting Toileting     Toileting steps completed by helper: Adjust clothing prior to toileting, Performs perineal hygiene, Adjust clothing after toileting(per Candice Applewhite, NT) Toileting Assistive Devices: Grab bar or rail  Toileting assist Assist level: Touching or steadying assistance (Pt.75%)    Transfers Chair/bed transfer   Chair/bed transfer method: Stand pivot Chair/bed transfer assist level: Touching or steadying assistance (Pt > 75%) Chair/bed transfer assistive device: Armrests     Locomotion Ambulation     Max distance: 30' Assist level: Maximal assist (Pt 25 - 49%)   Wheelchair   Type: Manual Max wheelchair distance: 150' Assist Level: Supervision or verbal cues  Cognition Comprehension Comprehension assist level: Follows basic conversation/direction with extra time/assistive device  Expression Expression assist level: Expresses basic 75 - 89% of the time/requires cueing 10 - 24% of the time. Needs helper to occlude trach/needs to repeat words.  Social Interaction Social Interaction assist level: Interacts appropriately 90% of the time - Needs monitoring or encouragement for participation or interaction.  Problem Solving Problem solving assist level: Solves basic 75 - 89% of the time/requires cueing 10 - 24% of the time  Memory Memory assist level: Recognizes or recalls 50 - 74% of the time/requires cueing 25 - 49% of the time    Medical Problem List and Plan: 1.  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 Status post loop recorder   Cont CIR   PRAFO/WHO  2.  DVT Prophylaxis/Anticoagulation: SCDs. Bilateral lower extremity Dopplers negative 3. Pain Management: Tylenol as needed 4. Mood: Provide emotional support 5. Neuropsych: This patient is not fully capable of making decisions on her own behalf. 6. Skin/Wound Care: Routine skin checks 7. Fluids/Electrolytes/Nutrition: Routine I&O's   BMP within acceptable range on 12/7 8. Tobacco abuse. Counseling/NicoDerm patch 9. Dysphagia. Follow-up speech therapy.   D3 thin, advance diet as tolerated 10. Hyperlipidemia. Lipitor 11. Hypertension.    Lopressor 12.5 mg twice a day, on hold due to potential allergy.    Overall controlled on 12/7  12.  Transaminitis: Resolved   Labs WNL on 12/7   Cont to monitor 13. Contact dermatitis   Continues to spread, now involving face   Tried benadryl cream, steroid cream, benadryl PO   Discussed with pharmacy, potential allergy to ASA > Metoprolol   Oral prednisone increased to twice a day.   After discussion with PA and nursing, appears to be a temporal relationship with metoprolol. Due to involvement of face, metoprolol and ASA DC'd, however patient or any received dose ASA this morning. We'll continue to monitor 14. Hypoalbuminemia   Supplement initiated on 12/7 15. Leukocytosis   WBCs 12.3 on 12/7   Afebrile, likely steroid effect  LOS (Days) 4 A FACE TO FACE EVALUATION WAS PERFORMED  Teodor Prater Lorie Phenix 09/01/2017 8:39 AM

## 2017-09-01 NOTE — Progress Notes (Signed)
Physical Therapy Session Note  Patient Details  Name: [name] MRN: [id number] Date of Birth: 05/20/1961  Today's Date: 09/01/2017 PT Missed Time: 18 Minutes Missed Time Reason: Patient unwilling to participate;Other (Comment)(having allergic reaction to medication)  Short Term Goals: Week 1:  PT Short Term Goal 1 (Week 1): Pt will ambulate 49' w/ LRAD w/ Mod A PT Short Term Goal 2 (Week 1): Pt will perform bed<>chair transfers w/ Min A  PT Short Term Goal 3 (Week 1): Pt will maintain dynamic standing balance w/ Min A  PT Short Term Goal 4 (Week 1): Pt will perform bed mobility w/ supervision   Skilled Therapeutic Interventions/Progress Updates:   Pt having suspected allergic reaction to medications per chart review and conversation w/ RN, declining PT this morning due to skin swelling and intense itchiness in face, arms, and legs.   Therapy Documentation Precautions:  Precautions Precautions: Fall Precaution Comments: right hemiparesis Restrictions Weight Bearing Restrictions: No Vital Signs: Therapy Vitals Temp: 98 F (36.7 C) Temp Source: Oral Pulse Rate: 84 Resp: 20 BP: (!) 117/93 Patient Position (if appropriate): Lying Oxygen Therapy SpO2: 95 % O2 Device: Not Delivered Pain: Pain Assessment Pain Assessment: 0-10 Pain Score: 6  Pain Type: Acute pain Pain Location: Generalized Pain Descriptors / Indicators: Discomfort;Nagging Pain Frequency: Constant Pain Onset: On-going Patients Stated Pain Goal: 2 Pain Intervention(s): Medication (See eMAR);Cold applied  See Function Navigator for Current Functional Status.   Therapy/Group: Individual Therapy  [name] Arnette 09/01/2017, 8:34 AM

## 2017-09-02 ENCOUNTER — Inpatient Hospital Stay (HOSPITAL_COMMUNITY): Payer: Self-pay

## 2017-09-02 ENCOUNTER — Inpatient Hospital Stay (HOSPITAL_COMMUNITY): Payer: Self-pay | Admitting: Physical Therapy

## 2017-09-02 ENCOUNTER — Inpatient Hospital Stay (HOSPITAL_COMMUNITY): Payer: Self-pay | Admitting: Speech Pathology

## 2017-09-02 DIAGNOSIS — R1314 Dysphagia, pharyngoesophageal phase: Secondary | ICD-10-CM

## 2017-09-02 DIAGNOSIS — T783XXS Angioneurotic edema, sequela: Secondary | ICD-10-CM

## 2017-09-02 MED ORDER — FAMOTIDINE IN NACL 20-0.9 MG/50ML-% IV SOLN
20.0000 mg | Freq: Two times a day (BID) | INTRAVENOUS | Status: DC
  Administered 2017-09-02 – 2017-09-03 (×2): 20 mg via INTRAVENOUS
  Filled 2017-09-02 (×2): qty 50

## 2017-09-02 MED ORDER — SODIUM CHLORIDE 0.9 % IV SOLN
40.0000 mg | Freq: Once | INTRAVENOUS | Status: AC
  Administered 2017-09-02: 40 mg via INTRAVENOUS
  Filled 2017-09-02: qty 4

## 2017-09-02 MED ORDER — SODIUM CHLORIDE 0.9 % IV SOLN: 40.0000 mg | Freq: Once | INTRAVENOUS | Status: DC

## 2017-09-02 MED ORDER — DIPHENHYDRAMINE HCL 50 MG/ML IJ SOLN
25.0000 mg | Freq: Three times a day (TID) | INTRAMUSCULAR | Status: DC | PRN
Start: 2017-09-02 — End: 2017-09-03
  Administered 2017-09-02: 25 mg via INTRAVENOUS
  Filled 2017-09-02: qty 1

## 2017-09-02 MED ORDER — DIPHENHYDRAMINE HCL 50 MG/ML IJ SOLN: 25.0000 mg | Freq: Once | INTRAMUSCULAR | Status: DC

## 2017-09-02 MED ORDER — DIPHENHYDRAMINE HCL 50 MG/ML IJ SOLN
25.0000 mg | Freq: Once | INTRAMUSCULAR | Status: AC
  Administered 2017-09-02: 25 mg via INTRAVENOUS
  Filled 2017-09-02: qty 1

## 2017-09-02 NOTE — Progress Notes (Signed)
Some redness/swelling noted to LUE abdomen and Lt leg. Per patient redness/swelling is improving. Continue to [employer]

## 2017-09-02 NOTE — Progress Notes (Signed)
[employer] PHYSICAL MEDICINE & REHABILITATION     PROGRESS NOTE  Subjective/Complaints:  Itching on abd, left arm, left leg increased again this am called by Rapid response. Started on IV benadryl, IV pepcid  ROS: +Rash. Denies CP, SOB, N/V/D.  Objective: Vital Signs: Blood pressure 129/61, pulse 92, temperature 97.8 F (36.6 C), temperature source Oral, resp. rate 18, height 5\' 2"  (1.575 m), weight 58.9 kg (129 lb 13.6 oz), SpO2 96 %. No results found. Recent Labs    09/01/17 0525  WBC 12.3*  HGB 14.3  HCT 41.3  PLT 463*   Recent Labs    09/01/17 0525  NA 137  K 3.7  CL 101  GLUCOSE 92  BUN 14  CREATININE 0.59  CALCIUM 9.2   CBG (last 3)  No results for input(s): GLUCAP in the last 72 hours.  Wt Readings from Last 3 Encounters:  08/28/17 58.9 kg (129 lb 13.6 oz)  08/21/17 60.8 kg (134 lb)    Physical Exam:  BP 129/61 (BP Location: Left Arm)   Pulse 92   Temp 97.8 F (36.6 C) (Oral)   Resp 18   Ht 5\' 2"  (1.575 m)   Wt 58.9 kg (129 lb 13.6 oz)   LMP  (LMP Unknown)   SpO2 96%   BMI 23.75 kg/m  Constitutional: She appears well-developed and well-nourished.  HENT: Atraumatic. Facial edema Eyes: EOM are normal. No discharge.  Cardiovascular: RRR. No JVD. Respiratory: Effort normal and breath sounds normal.  GI: Bowel sounds are normal. Non-distended Musculoskeletal: She exhibits no edema or tenderness.  Neurological: She is alert.  Right facial droop  A&Ox3 with increased time Word finding difficulty Motor: Right upper extremity 0/5 proximal to distal (stable) Right lower extremity 0/5 proximal to distal Sensation diminished to light touch RLE  Skin: macular rash mainly circular, Left arm, Left thigh, periumbilical, none on back Psychiatric: Her affect is blunt. Her speech is delayed. She is slowed. Cognition and memory are impaired.   Assessment/Plan: 1. Functional deficits secondary to left MCA infarct status post TPA and thrombectomy which require  3+ hours per day of interdisciplinary therapy in a comprehensive inpatient rehab setting. Physiatrist is providing close team supervision and 24 hour management of active medical problems listed below. Physiatrist and rehab team continue to assess barriers to discharge/monitor patient progress toward functional and medical goals.  Function:  Bathing Bathing position   Position: Shower  Bathing parts Body parts bathed by patient: Right arm, Chest, Abdomen, Front perineal area, Right upper leg, Left upper leg, Left lower leg Body parts bathed by helper: Left arm, Buttocks, Right lower leg, Back  Bathing assist        Upper Body Dressing/Undressing Upper body dressing   What is the patient wearing?: Pull over shirt/dress     Pull over shirt/dress - Perfomed by patient: Thread/unthread left sleeve, Put head through opening, Pull shirt over trunk, Thread/unthread right sleeve Pull over shirt/dress - Perfomed by helper: Pull shirt over trunk, Thread/unthread right sleeve        Upper body assist Assist Level: Supervision or verbal cues      Lower Body Dressing/Undressing Lower body dressing   What is the patient wearing?: Pants, Shoes     Pants- Performed by patient: Thread/unthread left pants leg Pants- Performed by helper: Pull pants up/down, Thread/unthread right pants leg Non-skid slipper socks- Performed by patient: Don/doff right sock Non-skid slipper socks- Performed by helper: Don/doff left sock     Shoes - Performed  by patient: Don/doff left shoe Shoes - Performed by helper: Don/doff right shoe          Lower body assist Assist for lower body dressing: (2/5, 40%, max assist)      Toileting Toileting     Toileting steps completed by helper: Adjust clothing prior to toileting, Performs perineal hygiene, Adjust clothing after toileting Toileting Assistive Devices: Grab bar or rail  Toileting assist Assist level: Touching or steadying assistance (Pt.75%)    Transfers Chair/bed transfer   Chair/bed transfer method: Squat pivot Chair/bed transfer assist level: Touching or steadying assistance (Pt > 75%) Chair/bed transfer assistive device: Armrests     Locomotion Ambulation     Max distance: 30' Assist level: Maximal assist (Pt 25 - 49%)   Wheelchair   Type: Manual Max wheelchair distance: 150' Assist Level: Supervision or verbal cues  Cognition Comprehension Comprehension assist level: Understands basic 90% of the time/cues < 10% of the time  Expression Expression assist level: Expresses basic 90% of the time/requires cueing < 10% of the time.  Social Interaction Social Interaction assist level: Interacts appropriately 90% of the time - Needs monitoring or encouragement for participation or interaction.  Problem Solving Problem solving assist level: Solves basic 90% of the time/requires cueing < 10% of the time  Memory Memory assist level: Recognizes or recalls 90% of the time/requires cueing < 10% of the time    Medical Problem List and Plan: 1.  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 Status post loop recorder   Cont CIR PT, OT   PRAFO/WHO  2.  DVT Prophylaxis/Anticoagulation: SCDs. Bilateral lower extremity Dopplers negative 3. Pain Management: Tylenol as needed 4. Mood: Provide emotional support 5. Neuropsych: This patient is not fully capable of making decisions on her own behalf. 6. Skin/Wound Care: Routine skin checks 7. Fluids/Electrolytes/Nutrition: Routine I&O's   BMP within acceptable range on 12/7 8. Tobacco abuse. Counseling/NicoDerm patch 9. Dysphagia. Follow-up speech therapy.   D3 thin, advance diet as tolerated 10. Hyperlipidemia. Lipitor 11. Hypertension.    Lopressor 12.5 mg twice a day, on hold due to potential allergy.    Overall controlled on 12/7  12. Transaminitis: Resolved   Labs WNL on 12/7   Cont to monitor 13. Probable drug  allergy, most likely ASA but pt also on lipitor, pro stat, will d/c     relationship with metoprolol. Due to involvement of face, metoprolol and ASA DC'd, hlast  dose ASA 12/7. We'll continue to monitor 14. Hypoalbuminemia   Supplement initiated on 12/7 15. Leukocytosis   WBCs 12.3 on 12/7   Afebrile, likely steroid effect  LOS (Days) 5 A FACE TO FACE EVALUATION WAS PERFORMED  Charlett Blake 09/02/2017 6:47 AM

## 2017-09-02 NOTE — Progress Notes (Signed)
Occupational Therapy Session Note  Patient Details  Name: [name] MRN: [id number] Date of Birth: October 31, 1960  Today's Date: 09/02/2017 OT Individual Time: 5638-9373 OT Individual Time Calculation (min): 30 min    Short Term Goals: Week 1:  OT Short Term Goal 1 (Week 1): Pt will complete UB dressing with min assist to donn pullover shirt.  OT Short Term Goal 2 (Week 1): Pt will complete LB bathing sit to stand with min assist.  OT Short Term Goal 3 (Week 1): Pt will complete LB dressing sit to stand with min assist following hemi technique.  OT Short Term Goal 4 (Week 1): Pt will complete toilet transfers with min assist stand pivot.  OT Short Term Goal 5 (Week 1): Pt will use the RUE as a stabilizer for selfcare tasks with mod assist and no more than min instructional cueing to initiate.    Skilled Therapeutic Interventions/Progress Updates:    1;1. Pt reporting pain in R knee however not severe, "just from working hard." Pt propels w/c to/from all tx destinations with hemi technique. Pt transfers throughout session with squat pivot with min A and VC for locking brakes. Pt completes 3 min intervals of shoulder flex/ext, horizontal ab/adduction, elbow flex/ext and circles with LUE on UE ranger with intermittent trace activation in deltoid. Pt required tactile/verbal cues to decrease trunk compensatory movements. Pt completes walk in shower transfer practicing stepping over 4" ledge with MOD A for balance, R knee blocking, and VC for weight shifting. Exited session with pt seated in w/c with call light inreach and QRB donned.   Therapy Documentation Precautions:  Precautions Precautions: Fall Precaution Comments: right hemiparesis Restrictions Weight Bearing Restrictions: No  See Function Navigator for Current Functional Status.   Therapy/Group: Individual Therapy  Tonny Branch 09/02/2017, 4:56 PM

## 2017-09-02 NOTE — Progress Notes (Addendum)
At around 5:35 am, patient broke out with hives, itching and tongue swelling. Repaid paged, MD notified, new order placed and order carried out per protocol. We continue to monitor.

## 2017-09-02 NOTE — Progress Notes (Signed)
Patient continue to complain of itching, PRN given for itching but continue to itch. We continue to monitor.

## 2017-09-02 NOTE — Progress Notes (Signed)
Patient complained of itching and rash noted on abdomen and left arm and upper legs at 3:40 vistaril given 3:41. Patient up in chair brushing teeth, reaccessed patient continues to complain of itching to abdomen, left arm and upper legs  patient assisted to bed hydrocortisone applied to hives.   5:35Patient notified tech that her throat was swelling tech notifed RN's vitals check and stable, facial swollen and tongue swelling noted rapid response called, MD notified orders given for pepcid, benadryl, given ordered dose of prednisone and claritin early,patient noted some relief, MD came to assess patient/ RR stayed with patient until MD assessed patient resting comfortably

## 2017-09-02 NOTE — Progress Notes (Signed)
Speech Language Pathology Daily Session Note  Patient Details  Name: [name] MRN: [id number] Date of Birth: Jul 29, 1961  Today's Date: 09/02/2017 SLP Individual Time: 0730-0810 SLP Individual Time Calculation (min): 40 min  Short Term Goals: Week 1: SLP Short Term Goal 1 (Week 1): Pt will use compensatory strategies to facilitate mildly complex word finding at the conversational level with supervision verbal cues.  SLP Short Term Goal 2 (Week 1): Pt will consume therapeutic trials of regular textures with mod I use of swallowing precautions to clear residue from the oral cavity.   SLP Short Term Goal 3 (Week 1): Pt will utilize chin tuck when consuming thin liquids with mod I in >75% of opportunities  SLP Short Term Goal 4 (Week 1): Pt will slow rate and overarticulate to achieve intelligibility at the conversational level with mod I  SLP Short Term Goal 5 (Week 1): Pt will identify at least 2 potential barriers to discharge and generate solutions with min question cues.    Skilled Therapeutic Interventions: Skilled treatment session focused on dysphagia and speech goals. Upon arrival, patient appeared anxious and reported she had a rough night due to an allergic reaction in which rapid response had to be called. She reported she feels somewhat better but that her tongue and oral cavity are still swollen in places. Patient consumed breakfast meal of Dys. 3 textures with thin liquids with intermittent overt s/s of aspiration due to not utilizing the chin tuck when distracted. Patient also required liquid washes to clear pharyngeal residue per patient report. Patient was also administered trials of regular textures and reported increased difficulty with pharyngeal clearing and need for liquid washes. Recommend continued trials, suspect upgrade possible as swelling continues to improve. Patient was 100% intelligible at the conversation level but required supervision verbal cues to decrease speech  rate. Patient left upright in bed with all needs within reach. Continue with current plan of care.      Function:  Eating Eating   Modified Consistency Diet: Yes Eating Assist Level: Set up assist for;Supervision or verbal cues   Eating Set Up Assist For: Opening containers       Cognition Comprehension Comprehension assist level: Understands basic 90% of the time/cues < 10% of the time  Expression   Expression assist level: Expresses basic 90% of the time/requires cueing < 10% of the time.  Social Interaction Social Interaction assist level: Interacts appropriately 75 - 89% of the time - Needs redirection for appropriate language or to initiate interaction.  Problem Solving Problem solving assist level: Solves basic 90% of the time/requires cueing < 10% of the time  Memory Memory assist level: Recognizes or recalls 90% of the time/requires cueing < 10% of the time    Pain No/Denies Pain   Therapy/Group: Individual Therapy  Ravi Tuccillo 09/02/2017, 8:09 AM

## 2017-09-02 NOTE — Progress Notes (Signed)
Physical Therapy Session Note  Patient Details  Name: [name] MRN: [id number] Date of Birth: May 17, 1961  Today's Date: 09/02/2017 PT Individual Time: 1500-1600 PT Individual Time Calculation (min): 60 min   Short Term Goals: Week 1:  PT Short Term Goal 1 (Week 1): Pt will ambulate 67' w/ LRAD w/ Mod A PT Short Term Goal 2 (Week 1): Pt will perform bed<>chair transfers w/ Min A  PT Short Term Goal 3 (Week 1): Pt will maintain dynamic standing balance w/ Min A  PT Short Term Goal 4 (Week 1): Pt will perform bed mobility w/ supervision   Skilled Therapeutic Interventions/Progress Updates:   Pt supine upon arrival and agreeable to therapy, no c/o pain. Transferred to EOB and to w/c via stand pivot w/ min assist using hemiwalker for support in front of w/c. Pt self-propelled w/c to/from gym w/ supervision using L hemi technique to continue practicing independence w/ mobility. Focused on gait training and pre-gait tasks this session. Ambulated in 10-20' bouts using hemiwalker, 3-4x. Mod assist overall for gait w/ verbal and visual cues for gait pattern using hemiwalker, verbal cues for activating R quad ms in single leg stance. Pt w/ increasing R quad activation this session, requiring no manual assist to maintain knee extension, however increase knee flexion in stance w/ fatigue towards end of session. Multiple LOB to R side w/ hemiwalker, requiring mod assist to correct. Performed pre-gait tasks w/ RLE including 2" step up 3x10 and practicing sit<>stands w/ hemiwalker support. Pt w/ multiple bouts of tearfulness regarding her diagnosis, however encouraged when reminded of her progress over past few days. Returned to room and ended session sitting up in w/c, chair alarm on and quick release belt engaged, call bell within reach and all needs met.   Therapy Documentation Precautions:  Precautions Precautions: Fall Precaution Comments: right hemiparesis Restrictions Weight Bearing  Restrictions: No Vital Signs: Therapy Vitals Temp: 98.4 F (36.9 C) Temp Source: Oral Pulse Rate: 78 Resp: 20 BP: (!) 154/80 Patient Position (if appropriate): Sitting Oxygen Therapy SpO2: 100 % O2 Device: Not Delivered  See Function Navigator for Current Functional Status.   Therapy/Group: Individual Therapy  [name] 09/02/2017, 7:25 PM

## 2017-09-02 NOTE — Significant Event (Signed)
Rapid Response Event Note  Overview: Time Called: 4975 Arrival Time: 0542 Event Type: Other (Comment)  Initial Focused Assessment: Called by RNs for patient having acute allergic reaction.  Per RNs, this is not the first episode but this episode started around 5am this morning, patient broke out into hives/welts and itching over abdomen, left arm, bilateral legs and tongue swelling. Patient is alert, s/p stroke, RUE/RLE weakness from stroke.  + facial swelling and redness around left eyes.  Lung sounds were clear and currently airway is maintained. VS: BP 129/78 (81), HR 66, 97% RA, RR 16.  Patient was emotional which is understandable and was reassured.  Ice and hydrocortisone cream were applied prior to my arrival.   Interventions: -- PIV inserted -- PO Prednisone 40mg  and Claritin (both of which patient is on) were given -- Benadryl 25mg  IV and Pepcid 40mg  IV given as well.   Plan of Care (if not transferred): -- Patient improved and Dr. Ella Bodo assessed patient at bedside as well  Event Summary: Name of Physician Notified: Dr. Ella Bodo  at [phone]    at    Outcome: Stayed in room and stabalized  Event End Time: [place], Delice Lesch

## 2017-09-02 NOTE — Progress Notes (Signed)
Occupational Therapy Session Note  Patient Details  Name: [name] MRN: [id number] Date of Birth: 05/02/1961  Today's Date: 09/02/2017 OT Individual Time: 1100-1200 OT Individual Time Calculation (min): 60 min    Short Term Goals: Week 1:  OT Short Term Goal 1 (Week 1): Pt will complete UB dressing with min assist to donn pullover shirt.  OT Short Term Goal 2 (Week 1): Pt will complete LB bathing sit to stand with min assist.  OT Short Term Goal 3 (Week 1): Pt will complete LB dressing sit to stand with min assist following hemi technique.  OT Short Term Goal 4 (Week 1): Pt will complete toilet transfers with min assist stand pivot.  OT Short Term Goal 5 (Week 1): Pt will use the RUE as a stabilizer for selfcare tasks with mod assist and no more than min instructional cueing to initiate.    Skilled Therapeutic Interventions/Progress Updates:    1:1. No pain reported however c/o inability to sleep d/t itchiness/allergic to medications. Condition is improving per pt report. Pt agreeable to participate in tx. Pt supine>sitting with MIN A for trunk facilitation and VC to tuck LLE under RLE to facilitate to EOB. Pt doffs/dons pull over shirt with VC for hemi techniques and threading RUE into sleeve. Pt stand pivot transfer with min A and VC for safety awareness/sequencing EOB>w/c<>BSC<>EOM. Pt toilets with A to advance pants past hips after toileting and MIN A for standing balance for clothing management. Pt stands at sink with L knee block and supervision to complete oral and hair care. Pt propels w/c to/from all tx destinations with hemi technique and supervision. Pt weight bears through R forearm to cross midline/obtain connect four pieces to play connect four game. Pt sit to stand with touching A using hemi walker swaying laterally to practice weight shifting and "turnin on"" R quad. Exited session with pt seated in w/c with husband and friend present in room.  Therapy  Documentation Precautions:  Precautions Precautions: Fall Precaution Comments: right hemiparesis Restrictions Weight Bearing Restrictions: No  See Function Navigator for Current Functional Status.   Therapy/Group: Individual Therapy  Tonny Branch 09/02/2017, 12:02 PM

## 2017-09-03 MED ORDER — DIPHENHYDRAMINE HCL 25 MG PO CAPS
25.0000 mg | ORAL_CAPSULE | Freq: Four times a day (QID) | ORAL | Status: DC | PRN
  Administered 2017-09-06: 25 mg via ORAL
  Filled 2017-09-03: qty 1

## 2017-09-03 MED ORDER — FAMOTIDINE 20 MG PO TABS
20.0000 mg | ORAL_TABLET | Freq: Two times a day (BID) | ORAL | Status: DC
  Administered 2017-09-03 – 2017-09-14 (×22): 20 mg via ORAL
  Filled 2017-09-03 (×23): qty 1

## 2017-09-03 NOTE — Progress Notes (Signed)
Received pt in chair with family at bedside, no itching noted at the time, at around at around 8:30 pm she started complaining of itching and her face started swelling, on call notified at 8:47 pm, he ordered Benadryl 25 mg IV and Pepcid 40 mg IV. Med given as ordered and was effective. Patient was able to sleep for the rest of the night. We continue to monitor.

## 2017-09-03 NOTE — Progress Notes (Signed)
Patient alert and sitting in chair. So swelling noted in face. Slight red welts still noted to Lt arm and abdomen. Pt reports itching better. Medicated this a.m

## 2017-09-03 NOTE — Progress Notes (Addendum)
[facility]     PROGRESS NOTE  Subjective/Complaints:  Itching but rash is gone, no tongue swelling  ROS: Pruritis scalp L arm. Denies CP, SOB, N/V/D.  Objective: Vital Signs: Blood pressure (!) 115/54, pulse 88, temperature 97.8 F (36.6 C), temperature source Oral, resp. rate 18, height 5\' 2"  (1.575 m), weight 58.9 kg (129 lb 13.6 oz), SpO2 96 %. No results found. Recent Labs    09/01/17 0525  WBC 12.3*  HGB 14.3  HCT 41.3  PLT 463*   Recent Labs    09/01/17 0525  NA 137  K 3.7  CL 101  GLUCOSE 92  BUN 14  CREATININE 0.59  CALCIUM 9.2   CBG (last 3)  No results for input(s): GLUCAP in the last 72 hours.  Wt Readings from Last 3 Encounters:  08/28/17 58.9 kg (129 lb 13.6 oz)  08/21/17 60.8 kg (134 lb)    Physical Exam:  BP (!) 115/54 (BP Location: Left Arm)   Pulse 88   Temp 97.8 F (36.6 C) (Oral)   Resp 18   Ht 5\' 2"  (1.575 m)   Wt 58.9 kg (129 lb 13.6 oz)   LMP  (LMP Unknown)   SpO2 96%   BMI 23.75 kg/m  Constitutional: She appears well-developed and well-nourished.  HENT: Atraumatic. Tongue is normal Eyes: EOM are normal. No discharge.  Cardiovascular: RRR. No JVD. Respiratory: Effort normal and breath sounds normal.  GI: Bowel sounds are normal. Non-distended Musculoskeletal: She exhibits no edema or tenderness.  Neurological: She is alert.  Right facial droop  A&Ox3 with increased time Word finding difficulty Motor: Right upper extremity 0/5 proximal to distal (stable) Right lower extremity 0/5 proximal to distal Sensation diminished to light touch RLE  Skin: no rash Psychiatric: Her affect is blunt. Her speech is delayed. She is slowed. Cognition and memory are impaired.   Assessment/Plan: 1. Functional deficits secondary to left MCA infarct status post TPA and thrombectomy which require 3+ hours per day of interdisciplinary therapy in a comprehensive inpatient rehab setting. Physiatrist is providing  close team supervision and 24 hour management of active medical problems listed below. Physiatrist and rehab team continue to assess barriers to discharge/monitor patient progress toward functional and medical goals.  Function:  Bathing Bathing position   Position: Shower  Bathing parts Body parts bathed by patient: Right arm, Chest, Abdomen, Front perineal area, Right upper leg, Left upper leg, Left lower leg Body parts bathed by helper: Left arm, Buttocks, Right lower leg, Back  Bathing assist        Upper Body Dressing/Undressing Upper body dressing   What is the patient wearing?: Pull over shirt/dress     Pull over shirt/dress - Perfomed by patient: Thread/unthread left sleeve, Put head through opening, Pull shirt over trunk, Thread/unthread right sleeve Pull over shirt/dress - Perfomed by helper: Pull shirt over trunk, Thread/unthread right sleeve        Upper body assist Assist Level: Supervision or verbal cues      Lower Body Dressing/Undressing Lower body dressing   What is the patient wearing?: Pants, Shoes     Pants- Performed by patient: Thread/unthread left pants leg Pants- Performed by helper: Pull pants up/down, Thread/unthread right pants leg Non-skid slipper socks- Performed by patient: Don/doff right sock Non-skid slipper socks- Performed by helper: Don/doff left sock     Shoes - Performed by patient: Don/doff left shoe Shoes - Performed by helper: Don/doff right shoe  Lower body assist Assist for lower body dressing: (2/5, 40%, max assist)      Toileting Toileting     Toileting steps completed by helper: Adjust clothing prior to toileting, Performs perineal hygiene, Adjust clothing after toileting Toileting Assistive Devices: Grab bar or rail  Toileting assist Assist level: Touching or steadying assistance (Pt.75%)   Transfers Chair/bed transfer   Chair/bed transfer method: Stand pivot Chair/bed transfer assist level: Touching or  steadying assistance (Pt > 75%) Chair/bed transfer assistive device: Armrests, Walker(hemiwalker)     Locomotion Ambulation     Max distance: 20' Assist level: Moderate assist (Pt 50 - 74%)   Wheelchair   Type: Manual Max wheelchair distance: 150' Assist Level: Supervision or verbal cues  Cognition Comprehension Comprehension assist level: Understands basic 90% of the time/cues < 10% of the time  Expression Expression assist level: Expresses basic 90% of the time/requires cueing < 10% of the time.  Social Interaction Social Interaction assist level: Interacts appropriately 90% of the time - Needs monitoring or encouragement for participation or interaction.  Problem Solving Problem solving assist level: Solves basic 90% of the time/requires cueing < 10% of the time  Memory Memory assist level: Recognizes or recalls 90% of the time/requires cueing < 10% of the time    Medical Problem List and Plan: 1.  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 Status post loop recorder   Cont CIR PT, OT   PRAFO/WHO  2.  DVT Prophylaxis/Anticoagulation: SCDs. Bilateral lower extremity Dopplers negative 3. Pain Management: Tylenol as needed 4. Mood: Provide emotional support 5. Neuropsych: This patient is not fully capable of making decisions on her own behalf. 6. Skin/Wound Care: Routine skin checks 7. Fluids/Electrolytes/Nutrition: Routine I&O's   BMP within acceptable range on 12/7 8. Tobacco abuse. Counseling/NicoDerm patch 9. Dysphagia. Follow-up speech therapy.   D3 thin, advance diet as tolerated 10. Hyperlipidemia. Lipitor 11. Hypertension.    Lopressor 12.5 mg twice a day, on hold due to potential allergy.    Overall controlled on 12/9 no tachy Vitals:   09/02/17 2124 09/03/17 0501  BP: 129/62 (!) 115/54  Pulse: 72 88  Resp: 18 18  Temp: 98.1 F (36.7 C) 97.8 F (36.6 C)  SpO2: 95% 96%   12. Transaminitis:  Resolved   Labs WNL on 12/7   Cont to monitor 13. Probable drug allergy, most likely ASA but pt also on lipitor, pro stat, will d/c     ?metoprolol, also held . We'll continue to monitor Change Benadryl and pepcid to po, may start weaning prednisone in am 14. Hypoalbuminemia   Supplement initiated on 12/7 15. Leukocytosis   WBCs 12.3 on 12/7   Afebrile, likely steroid effect  LOS (Days) 6 A FACE TO FACE EVALUATION WAS PERFORMED  Charlett Blake 09/03/2017 9:30 AM

## 2017-09-04 ENCOUNTER — Inpatient Hospital Stay (HOSPITAL_COMMUNITY): Payer: Self-pay | Admitting: Occupational Therapy

## 2017-09-04 ENCOUNTER — Inpatient Hospital Stay (HOSPITAL_COMMUNITY): Payer: Self-pay

## 2017-09-04 MED ORDER — ASPIRIN EC 325 MG PO TBEC
325.0000 mg | DELAYED_RELEASE_TABLET | Freq: Every day | ORAL | Status: DC
  Administered 2017-09-04 – 2017-09-14 (×11): 325 mg via ORAL
  Filled 2017-09-04 (×11): qty 1

## 2017-09-04 NOTE — Progress Notes (Signed)
Speech Language Pathology Daily Session Note  Patient Details  Name: [name] MRN: [id number] Date of Birth: 13-Jul-1961  Today's Date: 09/04/2017 SLP Individual Time: 0177-9390 SLP Individual Time Calculation (min): 45 min  Short Term Goals: Week 1: SLP Short Term Goal 1 (Week 1): Pt will use compensatory strategies to facilitate mildly complex word finding at the conversational level with supervision verbal cues.  SLP Short Term Goal 2 (Week 1): Pt will consume therapeutic trials of regular textures with mod I use of swallowing precautions to clear residue from the oral cavity.   SLP Short Term Goal 3 (Week 1): Pt will utilize chin tuck when consuming thin liquids with mod I in >75% of opportunities  SLP Short Term Goal 4 (Week 1): Pt will slow rate and overarticulate to achieve intelligibility at the conversational level with mod I  SLP Short Term Goal 5 (Week 1): Pt will identify at least 2 potential barriers to discharge and generate solutions with min question cues.    Skilled Therapeutic Interventions: Skilled ST services focused on swallow and speech skills. SLP facilitated trial of regular textured foods with no overt s/s aspiration, however required supervision A verbal cues to utilize chin tuck with each liquid swallow. SLP upgraded diet to regular and educated pt on diet upgrade, pt agreed. Pt required min-supervision A verbal cues for word finding in semi-complex conversation about recovery progress and piror level of independence, pt became emotional crying at times stating " my emotions have change and I cry at the drop pf a hat." SLP educated pt about the physical and emotional recovery process, pt agreed to see psychologist. Pt was left in room with call bell within reach. Recommend to cotinue skilled ST services.     Function:  Eating Eating   Modified Consistency Diet: No Eating Assist Level: Set up assist for   Eating Set Up Assist For: Opening containers        Cognition Comprehension Comprehension assist level: Understands basic 90% of the time/cues < 10% of the time  Expression   Expression assist level: Expresses basic 90% of the time/requires cueing < 10% of the time.  Social Interaction Social Interaction assist level: Interacts appropriately 90% of the time - Needs monitoring or encouragement for participation or interaction.  Problem Solving Problem solving assist level: Solves basic 90% of the time/requires cueing < 10% of the time  Memory Memory assist level: Recognizes or recalls 90% of the time/requires cueing < 10% of the time    Pain Pain Assessment Pain Assessment: No/denies pain  Therapy/Group: Individual Therapy  Floy Angert  [facility] 09/04/2017, 12:48 PM

## 2017-09-04 NOTE — Progress Notes (Signed)
Occupational Therapy Session Note  Patient Details  Name: [name] MRN: [id number] Date of Birth: 11-20-1960  Today's Date: 09/04/2017 OT Individual Time: 1115-1200 OT Individual Time Calculation (min): 45 min    Short Term Goals: Week 1:  OT Short Term Goal 1 (Week 1): Pt will complete UB dressing with min assist to donn pullover shirt.  OT Short Term Goal 2 (Week 1): Pt will complete LB bathing sit to stand with min assist.  OT Short Term Goal 3 (Week 1): Pt will complete LB dressing sit to stand with min assist following hemi technique.  OT Short Term Goal 4 (Week 1): Pt will complete toilet transfers with min assist stand pivot.  OT Short Term Goal 5 (Week 1): Pt will use the RUE as a stabilizer for selfcare tasks with mod assist and no more than min instructional cueing to initiate.    Skilled Therapeutic Interventions/Progress Updates:    Pt presents supine in bed, agreeable to OT tx session. Pt completes stand pivot transfer EOB to w/c with MinA; completes grooming ADLs with setup/supervision assist and intermittent HOH assist to incorporate use of RUE into task completion. Pt propels w/c into bathroom, minA for correctly aligning w/c prior to transfer and MinA for stand pivot to [facility] over toilet using grab bars. Pt completes clothing management utilizing lateral leans, completes peri-care in sitting. MinA provided for clothing management in standing after toileting with MinA for standing balance and transfer to w/c. Pt self propels w/c to therapy gym, min verbal cues for aligning w/c to mat table prior to transfer. Pt transfers w/c<>mat table with MinA. While seated on mat table Pt engages in dynamic sitting card sorting activity with therapist facilitating wt bearing through [place]. Pt crossing midline and reaching outside BOS to place cards - intermittently with mild LOB though is able to correct with light minA. Pt returns to room in manner described above, correctly aligns w/c for  transfer to EOB without assist. Pt left supine in bed, bed alarm set, call bell and needs within reach.   Therapy Documentation Precautions:  Precautions Precautions: Fall Precaution Comments: right hemiparesis Restrictions Weight Bearing Restrictions: No   Pain: Pain Assessment Pain Assessment: No/denies pain  See Function Navigator for Current Functional Status.   Therapy/Group: Individual Therapy  Raymondo Band 09/04/2017, 4:19 PM

## 2017-09-04 NOTE — Progress Notes (Signed)
PHYSICAL MEDICINE & REHABILITATION     PROGRESS NOTE  Subjective/Complaints:  Pt seen working with OT this AM.  She states she slept well overnight. She notes improvement in her rash and pruritus.  ROS: Denies CP, SOB, N/V/D.  Objective: Vital Signs: Blood pressure 113/60, pulse 64, temperature 98.2 F (36.8 C), temperature source Oral, resp. rate 16, height 5\' 2"  (1.575 m), weight 58.9 kg (129 lb 13.6 oz), SpO2 96 %. No results found. No results for input(s): WBC, HGB, HCT, PLT in the last 72 hours. No results for input(s): NA, K, CL, GLUCOSE, BUN, CREATININE, CALCIUM in the last 72 hours.  Invalid input(s): CO CBG (last 3)  No results for input(s): GLUCAP in the last 72 hours.  Wt Readings from Last 3 Encounters:  08/28/17 58.9 kg (129 lb 13.6 oz)  08/21/17 60.8 kg (134 lb)    Physical Exam:  BP 113/60 (BP Location: Left Arm)   Pulse 64   Temp 98.2 F (36.8 C) (Oral)   Resp 16   Ht 5\' 2"  (1.575 m)   Wt 58.9 kg (129 lb 13.6 oz)   LMP  (LMP Unknown)   SpO2 96%   BMI 23.75 kg/m  Constitutional: She appears well-developed and well-nourished.  HENT: Atraumatic. Normocephalic. Eyes: EOMI. No discharge.  Cardiovascular: RRR. No JVD. Respiratory: Effort normal and breath sounds normal.  GI: Bowel sounds are normal. Non-distended Musculoskeletal: She exhibits no edema or tenderness.  Neurological: She is alert.  Right facial droop  A&Ox3 with increased time Word finding difficulty Motor: Right upper extremity 0/5 proximal to distal (unchanged) Right lower extremity 0/5 proximal to distal Sensation diminished to light touch RLE  Skin: Improving rash  Psychiatric: Her affect is blunt. Her speech is delayed. She is slowed. Cognition and memory are impaired.   Assessment/Plan: 1. 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 Physiatrist is providing close team supervision and 24 hour management of active medical problems listed below. Physiatrist and rehab team continue to assess barriers to discharge/monitor patient progress toward functional and medical goals.  Function:  Bathing Bathing position   Position: Shower  Bathing parts Body parts bathed by patient: Right arm, Chest, Abdomen, Front perineal area, Right upper leg, Left upper leg, Left lower leg Body parts bathed by helper: Left arm, Buttocks, Right lower leg, Back  Bathing assist        Upper Body Dressing/Undressing Upper body dressing   What is the patient wearing?: Pull over shirt/dress     Pull over shirt/dress - Perfomed by patient: Thread/unthread left sleeve, Put head through opening, Pull shirt over trunk, Thread/unthread right sleeve Pull over shirt/dress - Perfomed by helper: Pull shirt over trunk, Thread/unthread right sleeve        Upper body assist Assist Level: Supervision or verbal cues      Lower Body Dressing/Undressing Lower body dressing   What is the patient wearing?: Pants, Shoes     Pants- Performed by patient: Thread/unthread left pants leg Pants- Performed by helper: Pull pants up/down, Thread/unthread right pants leg Non-skid slipper socks- Performed by patient: Don/doff right sock Non-skid slipper socks- Performed by helper: Don/doff left sock     Shoes - Performed by patient: Don/doff left shoe Shoes - Performed by helper: Don/doff right shoe          Lower body assist Assist for lower body dressing: (  2/5, 40%, max assist)      Toileting Toileting   Toileting steps completed by patient: Performs perineal hygiene Toileting steps completed by helper: Adjust clothing prior to toileting, Performs perineal hygiene, Adjust clothing after toileting Toileting Assistive Devices: Grab bar or rail  Toileting assist Assist level: Touching or steadying assistance (Pt.75%)   Transfers Chair/bed  transfer   Chair/bed transfer method: Stand pivot Chair/bed transfer assist level: Touching or steadying assistance (Pt > 75%) Chair/bed transfer assistive device: Armrests, Walker(hemiwalker)     Locomotion Ambulation     Max distance: 20' Assist level: Moderate assist (Pt 50 - 74%)   Wheelchair   Type: Manual Max wheelchair distance: 150' Assist Level: Supervision or verbal cues  Cognition Comprehension Comprehension assist level: Understands basic 90% of the time/cues < 10% of the time  Expression Expression assist level: Expresses basic 90% of the time/requires cueing < 10% of the time.  Social Interaction Social Interaction assist level: Interacts appropriately 90% of the time - Needs monitoring or encouragement for participation or interaction.  Problem Solving Problem solving assist level: Solves basic 90% of the time/requires cueing < 10% of the time  Memory Memory assist level: Recognizes or recalls 90% of the time/requires cueing < 10% of the time    Medical Problem List and Plan: 1.  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 Status post loop recorder   Cont CIR    PRAFO/WHO  2.  DVT Prophylaxis/Anticoagulation: SCDs. Bilateral lower extremity Dopplers negative 3. Pain Management: Tylenol as needed 4. Mood: Provide emotional support 5. Neuropsych: This patient is not fully capable of making decisions on her own behalf. 6. Skin/Wound Care: Routine skin checks 7. Fluids/Electrolytes/Nutrition: Routine I&O's   BMP within acceptable range on 12/7 8. Tobacco abuse. Counseling/NicoDerm patch 9. Dysphagia. Follow-up speech therapy.   D3 thin, advance diet as tolerated 10. Hyperlipidemia. Lipitor 11. Hypertension.    Lopressor 12.5 mg twice a day, on hold due to potential allergy.    Overall controlled on 12/10 Vitals:   09/03/17 1529 09/04/17 0534  BP: (!) 131/56 113/60  Pulse: 80 64  Resp: 16 16   Temp: 98.2 F (36.8 C) 98.2 F (36.8 C)  SpO2: 93% 96%   12. Transaminitis: Resolved   Labs WNL on 12/7   Cont to monitor 13. Drug allergy   Most likely metoprolol, but also on ASA.   Metoprolol d/ced   ASA restarted on 12/10, monitor closesly   We'll continue to monitor   Change Benadryl and pepcid to po   May wean steroids tomorrow if stable today 14. Hypoalbuminemia   Supplement initiated on 12/7 15. Leukocytosis   WBCs 12.3 on 12/7   Afebrile, likely steroid effect  LOS (Days) 7 A FACE TO FACE EVALUATION WAS PERFORMED  Marli Diego Lorie Phenix 09/04/2017 8:35 AM

## 2017-09-04 NOTE — Progress Notes (Signed)
Occupational Therapy Note  Patient Details  Name: SUNDAI PROBERT MRN: [id number] Date of Birth: 1960/09/29  Today's Date: 09/04/2017 OT Individual Time: 1415-1505 OT Individual Time Calculation (min): 50 min   No c/o pain  Pt seen this session to facilitate RUE active movement. Pt received  In w/c and taken to gym. Pt completed stand pivot to mat with min A.  Pt worked in unsupported sitting for 45 min on RUE NMR activities: -towel slides on tray table with sh flex/ext, circular movement, and elbow flexion. Pt was surprised to see she had active elbow flexion. -practiced pushing cones off table by sliding hand with elbow flexion movements -hand over hand guided movements of grasping and releasing cones -hand over hand guided movements of simulated face and arm washing with washcloth in R hand -B hands on small ball with R hand support moving ball out and in and towards her chin Repeated elb flexion movements in gravity eliminated position. Educated pt on exercises she can do in her room herself.  Pt taken back to room with all needs met.      [place] 09/04/2017, 3:16 PM

## 2017-09-04 NOTE — Progress Notes (Signed)
Physical Therapy Session Note  Patient Details  Name: [name] MRN: [id number] Date of Birth: March 09, 1961  Today's Date: 09/04/2017 PT Individual Time:  0800-0858 PT total time: 58 minutes  Short Term Goals: Week 1:  PT Short Term Goal 1 (Week 1): Pt will ambulate 108' w/ LRAD w/ Mod A PT Short Term Goal 2 (Week 1): Pt will perform bed<>chair transfers w/ Min A  PT Short Term Goal 3 (Week 1): Pt will maintain dynamic standing balance w/ Min A  PT Short Term Goal 4 (Week 1): Pt will perform bed mobility w/ supervision   Skilled Therapeutic Interventions/Progress Updates:    Pt seated in w/c upon PT arrival, agreeable to therapy tx and denies pain. Pt performed upper/lower body dressing from w/c with supervision, sit<>stand with min assist to pull pants over hips. Pt propelled w/c from room<>gym x 150 ft each direction with supervision. Pt ambulated x 30 ft with mod assist using hemi walker, DF wrap for foot clearance, manual facilitation for weightshift over R LE during stance phase and manual facilitation for quad activation. Session focused on R LE neuro re-ed. Pt performed 3 x 10 sit<>stands with dynadisc under L LE for emphasis on R LE weightbearing, no UE support and min assist. Pt performed 2 x 10 R LE lateral step ups on 2inch step with focus on R knee/hip extension. Pt ambulated 30 ft with hemi walker and mod assist. Pt propelled w/c back to room and transferred to bed min assist. Left supine in bed with needs in reach.   Therapy Documentation Precautions:  Precautions Precautions: Fall Precaution Comments: right hemiparesis Restrictions Weight Bearing Restrictions: No   See Function Navigator for Current Functional Status.   Therapy/Group: Individual Therapy  Netta Corrigan, PT, DPT 09/04/2017, 7:48 AM

## 2017-09-05 ENCOUNTER — Inpatient Hospital Stay (HOSPITAL_COMMUNITY): Payer: Self-pay | Admitting: Occupational Therapy

## 2017-09-05 ENCOUNTER — Inpatient Hospital Stay (HOSPITAL_COMMUNITY): Payer: Self-pay | Admitting: Speech Pathology

## 2017-09-05 ENCOUNTER — Encounter (HOSPITAL_COMMUNITY): Payer: Self-pay

## 2017-09-05 ENCOUNTER — Inpatient Hospital Stay (HOSPITAL_COMMUNITY): Payer: Self-pay

## 2017-09-05 DIAGNOSIS — D72829 Elevated white blood cell count, unspecified: Secondary | ICD-10-CM

## 2017-09-05 DIAGNOSIS — Z889 Allergy status to unspecified drugs, medicaments and biological substances status: Secondary | ICD-10-CM

## 2017-09-05 MED ORDER — PREDNISONE 20 MG PO TABS
40.0000 mg | ORAL_TABLET | Freq: Every day | ORAL | Status: DC
  Administered 2017-09-06: 40 mg via ORAL
  Filled 2017-09-05 (×2): qty 2

## 2017-09-05 NOTE — Progress Notes (Signed)
[place] PHYSICAL MEDICINE & REHABILITATION     PROGRESS NOTE  Subjective/Complaints:  Pt seen laying in bed this AM.  She slept well overnight.  She feels her rash is improving.    ROS: Denies CP, SOB, N/V/D.  Objective: Vital Signs: Blood pressure 132/61, pulse 68, temperature (!) 97.4 F (36.3 C), temperature source Oral, resp. rate 16, height 5\' 2"  (1.575 m), weight 58.9 kg (129 lb 13.6 oz), SpO2 95 %. No results found. No results for input(s): WBC, HGB, HCT, PLT in the last 72 hours. No results for input(s): NA, K, CL, GLUCOSE, BUN, CREATININE, CALCIUM in the last 72 hours.  Invalid input(s): CO CBG (last 3)  No results for input(s): GLUCAP in the last 72 hours.  Wt Readings from Last 3 Encounters:  08/28/17 58.9 kg (129 lb 13.6 oz)  08/21/17 60.8 kg (134 lb)    Physical Exam:  BP 132/61 (BP Location: Left Arm)   Pulse 68   Temp (!) 97.4 F (36.3 C) (Oral)   Resp 16   Ht 5\' 2"  (1.575 m)   Wt 58.9 kg (129 lb 13.6 oz)   LMP  (LMP Unknown)   SpO2 95%   BMI 23.75 kg/m  Constitutional: She appears well-developed and well-nourished.  HENT: Atraumatic. Normocephalic. Eyes: EOMI. No discharge.  Cardiovascular: RRR. No JVD. Respiratory: Effort normal and breath sounds normal.  GI: Bowel sounds are normal. Non-distended Musculoskeletal: She exhibits no edema or tenderness.  Neurological: She is alert.  Right facial droop  A&Ox3 with increased time Word finding difficulty Motor: Right upper extremity 0/5 proximal to distal  Right lower extremity: 1/5 HF, 0/5 distally Sensation diminished to light touch RLE  Skin: Improving rash  Psychiatric: Her affect is blunt. Her speech is delayed. She is slowed. Cognition and memory are impaired.   Assessment/Plan: 1. Functional deficits secondary to left MCA infarct status post TPA and thrombectomy which require 3+ hours per day of interdisciplinary therapy in a comprehensive inpatient rehab setting. Physiatrist is providing  close team supervision and 24 hour management of active medical problems listed below. Physiatrist and rehab team continue to assess barriers to discharge/monitor patient progress toward functional and medical goals.  Function:  Bathing Bathing position   Position: Shower  Bathing parts Body parts bathed by patient: Right arm, Chest, Abdomen, Front perineal area, Right upper leg, Left upper leg, Left lower leg Body parts bathed by helper: Left arm, Buttocks, Right lower leg, Back  Bathing assist        Upper Body Dressing/Undressing Upper body dressing   What is the patient wearing?: Pull over shirt/dress     Pull over shirt/dress - Perfomed by patient: Thread/unthread left sleeve, Put head through opening, Pull shirt over trunk, Thread/unthread right sleeve Pull over shirt/dress - Perfomed by helper: Pull shirt over trunk, Thread/unthread right sleeve        Upper body assist Assist Level: Supervision or verbal cues      Lower Body Dressing/Undressing Lower body dressing   What is the patient wearing?: Pants, Shoes     Pants- Performed by patient: Thread/unthread left pants leg Pants- Performed by helper: Pull pants up/down, Thread/unthread right pants leg Non-skid slipper socks- Performed by patient: Don/doff right sock Non-skid slipper socks- Performed by helper: Don/doff left sock     Shoes - Performed by patient: Don/doff left shoe Shoes - Performed by helper: Don/doff right shoe          Lower body assist Assist for lower body  dressing: (2/5, 40%, max assist)      Toileting Toileting   Toileting steps completed by patient: Performs perineal hygiene, Adjust clothing prior to toileting Toileting steps completed by helper: Adjust clothing prior to toileting, Performs perineal hygiene, Adjust clothing after toileting Toileting Assistive Devices: Grab bar or rail  Toileting assist Assist level: Touching or steadying assistance (Pt.75%)   Transfers Chair/bed  transfer   Chair/bed transfer method: Stand pivot Chair/bed transfer assist level: Touching or steadying assistance (Pt > 75%) Chair/bed transfer assistive device: Armrests     Locomotion Ambulation     Max distance: 30 ft Assist level: Moderate assist (Pt 50 - 74%)   Wheelchair   Type: Manual Max wheelchair distance: 150' Assist Level: Supervision or verbal cues  Cognition Comprehension Comprehension assist level: Understands basic 90% of the time/cues < 10% of the time  Expression Expression assist level: Expresses basic 90% of the time/requires cueing < 10% of the time.  Social Interaction Social Interaction assist level: Interacts appropriately 90% of the time - Needs monitoring or encouragement for participation or interaction.  Problem Solving Problem solving assist level: Solves basic 90% of the time/requires cueing < 10% of the time  Memory Memory assist level: Recognizes or recalls 90% of the time/requires cueing < 10% of the time    Medical Problem List and Plan: 1.  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 Status post loop recorder   Cont CIR    PRAFO/WHO  2.  DVT Prophylaxis/Anticoagulation: SCDs. Bilateral lower extremity Dopplers negative 3. Pain Management: Tylenol as needed 4. Mood: Provide emotional support 5. Neuropsych: This patient is not fully capable of making decisions on her own behalf. 6. Skin/Wound Care: Routine skin checks 7. Fluids/Electrolytes/Nutrition: Routine I&O's   BMP within acceptable range on 12/7 8. Tobacco abuse. Counseling/NicoDerm patch 9. Dysphagia. Follow-up speech therapy.   Advanced to regular diet with supervision 10. Hyperlipidemia. Lipitor 11. Hypertension.    Lopressor 12.5 d/ced due to allergy.    Overall controlled on 12/11 Vitals:   09/04/17 1400 09/05/17 0504  BP: (!) 143/76 132/61  Pulse: 91 68  Resp: 14 16  Temp: 98.5 F (36.9 C) (!) 97.4 F (36.3  C)  SpO2: 96% 95%   12. Transaminitis: Resolved   Labs WNL on 12/7   Cont to monitor 13. Drug allergy   Most likely metoprolol, but was also on ASA.   Metoprolol d/ced   ASA restarted on 12/10, monitor closesly   Change Benadryl and pepcid to po   Improving, wean steroids to daily today 14. Hypoalbuminemia   Supplement initiated on 12/7 15. Leukocytosis   WBCs 12.3 on 12/7   Afebrile, likely steroid effect   Labs ordered for tomorrow  LOS (Days) 8 A FACE TO FACE EVALUATION WAS PERFORMED  Ankit Lorie Phenix 09/05/2017 7:58 AM

## 2017-09-05 NOTE — Progress Notes (Signed)
Physical Therapy Weekly Progress Note  Patient Details  Name: [name] MRN: [id number] Date of Birth: 1961-01-06  Beginning of progress report period: August 29, 2017 End of progress report period: September 05, 2017   Patient has met 4 of 4 short term goals.  This patient demonstrates improved R LE muscle activation, and dynamic balance however continues to demonstrates impairments in safety, awareness, postural control and strength, limiting her ability to perform functional transfers and ambulate without assist. This patient has met all of her goals and demonstrates good progress over the past week. She demonstrates emotional changes with crying outbursts frequently during sessions, at times limiting her ability to participate.   Patient continues to demonstrate the following deficits muscle weakness, impaired timing and sequencing and decreased coordination and decreased standing balance, decreased postural control and decreased balance strategies and therefore will continue to benefit from skilled PT intervention to increase functional independence with mobility.  Patient progressing toward long term goals..  Continue plan of care.  PT Short Term Goals Week 1:  PT Short Term Goal 1 (Week 1): Pt will ambulate 22' w/ LRAD w/ Mod A PT Short Term Goal 1 - Progress (Week 1): Met PT Short Term Goal 2 (Week 1): Pt will perform bed<>chair transfers w/ Min A  PT Short Term Goal 2 - Progress (Week 1): Met PT Short Term Goal 3 (Week 1): Pt will maintain dynamic standing balance w/ Min A  PT Short Term Goal 3 - Progress (Week 1): Met PT Short Term Goal 4 (Week 1): Pt will perform bed mobility w/ supervision  PT Short Term Goal 4 - Progress (Week 1): Met Week 2:  PT Short Term Goal 1 (Week 2): STG=LTG  Skilled Therapeutic Interventions/Progress Updates:  Ambulation/gait training;Disease management/prevention;Pain management;Stair training;Visual/perceptual  remediation/compensation;Wheelchair propulsion/positioning;Therapeutic Activities;Patient/family education;DME/adaptive equipment instruction;Balance/vestibular training;Cognitive remediation/compensation;Psychosocial support;Therapeutic Exercise;UE/LE Strength taining/ROM;Skin care/wound management;Functional electrical stimulation;Functional mobility training;Community reintegration;Discharge planning;Neuromuscular re-education;Splinting/orthotics;UE/LE Coordination activities     See Function Navigator for Current Functional Status.  Therapy/Group: Individual Therapy  Netta Corrigan, PT, DPT 09/05/2017, 11:52 AM

## 2017-09-05 NOTE — Progress Notes (Signed)
Occupational Therapy Weekly Progress Note  Patient Details  Name: [name] MRN: [id number] Date of Birth: 07/29/1961  Beginning of progress report period: August 29, 2017 End of progress report period: September 05, 2017  Today's Date: 09/05/2017 OT Individual Time: 1300-1415 OT Individual Time Calculation (min): 75 min    Patient has met 3 of 5 short term goals.  Mrs. Demetrius continues to make steady progress with OT.  Currently, she demonstrates Brunnstrum stage II movement in her right shoulder with stage I in the hand.  She needs max hand over hand assistance for integration of the RUE into function during selfcare tasks, as a stabilizer.  She is able to complete UB and LB selfcare tasks at a min assist level currently.  She continues to demonstrate some problem solving deficits as well as deficits with sequencing and memory, especially with recall from day to day.   Transfers are mod assist stand pivot as well, approaching min assist.  Feel she is on target to meet established goals with continued CIR level therapy.  Will continue with current OT treatment plan.    Patient continues to demonstrate the following deficits: muscle weakness, impaired timing and sequencing, unbalanced muscle activation and decreased coordination, decreased awareness, decreased problem solving and decreased memory and decreased standing balance, decreased postural control, hemiplegia and decreased balance strategies and therefore will continue to benefit from skilled OT intervention to enhance overall performance with BADL.  Patient progressing toward long term goals..  Continue plan of care.  OT Short Term Goals Week 2:  OT Short Term Goal 1 (Week 2): Pt will complete toilet transfers with min assist stand pivot.  OT Short Term Goal 2 (Week 2): Pt will use the RUE as a stabilizer for selfcare tasks with mod assist and no more than min instructional cueing to initiate.   OT Short Term Goal 3 (Week 2): Pt  will complete shower transfer with min assist stand pivot. OT Short Term Goal 4 (Week 2): Pt will completed LB dressing sit to stand with supervision.   Skilled Therapeutic Interventions/Progress Updates:   Pt completed bathing and dressing shower level to start session.  She needed mod facilitation for ambulation to the walk-in shower from wheelchair at bedside.  She was able to remove current dirty clothing with min assist sit to stand before showering.  Min assist for bathing with education on hemi bathing techniques to wash the LUE as well as hand over hand assist with use of the RUE to wash the left arm.  Min assist for sit to stand from tub bench for washing buttocks.  Mod assist to transfer stand pivot back to the wheelchair once she was dried off.  She completed dressing sit to stand as well at min assist.  Mod demonstrational cueing for following hemidressing techniques to donn a pullover shirt.  Grooming tasks completed with supervision at the sink from wheelchair as well before having pt roll herself down to the therapy gym with supervision.  Worked on neuromuscular re-education for the RUE in sitting with use of tilted stool, weightbearing tasks, and use of therapy ball on top of a board.  Pt able to activate internal and external rotation as well as some shoulder flexion with use of stool and with ball.  Transferred back to the wheelchair with mod assist stand pivot and then therapist assisted with rolling back to the room where pt was left with spouse up in wheelchair.  Call button and phone in reach.       Occupational Therapy Weekly Progress Note  Patient Details  Name: [name] MRN: [id number] Date of Birth: 1961/03/05  Beginning of progress report period: August 29, 2017 End of progress report period: September 05, 2017  Today's Date: 09/05/2017 OT Individual Time: 1300-1415 OT Individual Time Calculation (min): 75 min    Patient has met 3 of 5 short term goals.  Mrs. Smart continues to make steady progress with OT.  Currently, she demonstrates Brunnstrum stage II movement in her right shoulder with stage I in the hand.  She needs max hand over hand assistance for integration of the RUE into function during selfcare tasks, as a stabilizer.  She is able to complete UB and LB selfcare tasks at a min assist level currently.  She continues to demonstrate some problem solving deficits as well as deficits with sequencing and memory, especially with recall from day to day.   Transfers are mod assist stand pivot as well, approaching min assist.  Feel she is on target to meet established goals with continued CIR level therapy.  Will continue with current OT treatment plan.    Patient continues to demonstrate the following deficits: muscle weakness, impaired timing and sequencing, unbalanced muscle activation and decreased coordination, decreased awareness, decreased problem solving and decreased memory and decreased standing balance, decreased postural control, hemiplegia and decreased balance strategies and therefore will continue to benefit from skilled OT intervention to enhance overall performance with BADL.  Patient progressing toward long term goals..  Continue plan of care.  OT Short Term Goals Week 2:  OT Short Term Goal 1 (Week 2): Pt will complete toilet transfers with min assist stand pivot.  OT Short Term Goal 2 (Week 2): Pt will use the RUE as a stabilizer for selfcare tasks with mod assist and no more than min instructional cueing to initiate.   OT Short Term Goal 3 (Week 2): Pt  will complete shower transfer with min assist stand pivot. OT Short Term Goal 4 (Week 2): Pt will completed LB dressing sit to stand with supervision.   Skilled Therapeutic Interventions/Progress Updates:   Pt completed bathing and dressing shower level to start session.  She needed mod facilitation for ambulation to the walk-in shower from wheelchair at bedside.  She was able to remove current dirty clothing with min assist sit to stand before showering.  Min assist for bathing with education on hemi bathing techniques to wash the LUE as well as hand over hand assist with use of the RUE to wash the left arm.  Min assist for sit to stand from tub bench for washing buttocks.  Mod assist to transfer stand pivot back to the wheelchair once she was dried off.  She completed dressing sit to stand as well at min assist.  Mod demonstrational cueing for following hemidressing techniques to donn a pullover shirt.  Grooming tasks completed with supervision at the sink from wheelchair as well before having pt roll herself down to the therapy gym with supervision.  Worked on neuromuscular re-education for the RUE in sitting with use of tilted stool, weightbearing tasks, and use of therapy ball on top of a board.  Pt able to activate internal and external rotation as well as some shoulder flexion with use of stool and with ball.  Transferred back to the wheelchair with mod assist stand pivot and then therapist assisted with rolling back to the room where pt was left with spouse up in wheelchair.  Call button and phone in reach.

## 2017-09-05 NOTE — Plan of Care (Signed)
Pt education continues with pt increasing in understanding and treatment.

## 2017-09-05 NOTE — Progress Notes (Signed)
Physical Therapy Session Note  Patient Details  Name: [name] MRN: [id number] Date of Birth: March 28, 1961  Today's Date: 09/05/2017 PT Individual Time: 0800-0857 PT Individual Time Calculation (min): 57 min   Short Term Goals: Week 1:  PT Short Term Goal 1 (Week 1): Pt will ambulate 52' w/ LRAD w/ Mod A PT Short Term Goal 2 (Week 1): Pt will perform bed<>chair transfers w/ Min A  PT Short Term Goal 3 (Week 1): Pt will maintain dynamic standing balance w/ Min A  PT Short Term Goal 4 (Week 1): Pt will perform bed mobility w/ supervision   Skilled Therapeutic Interventions/Progress Updates:    Pt using the bathroom with NT upon PT arrival, agreeable to therapy tx and denies pain. Pt transferred from toilet>w/c min assist, stand pivot. Pt propelled w/c to sink to wash hands with supervision. Pt propelled w/c from room>gym with supervision using L hemi technique. Pt ambulated x 30 ft using hemi walker and mod assist, pt continues to demonstrate R lateral lean during ambulation and narrow BOS, verbal cues for R shortened step length and widened BOS. Pt worked on foot placement of R LE while stepping in place forward/backwards to a target, using hemiwalker for UE support. Pt becoming tearful during this session, therapist provided emotional support. Pt transferred from w/c>mat min assist stand pivot. Pt transferred sitting<>supine with supervision. In supine pt worked on R LE neuro re-ed with emphasis on isolated muscle contraction: 2 x 10 heel slides, 2 x 10 SAQ, 2 x 10 assisted single leg bridges, and x 10 isometric contraction of tibialis anterior. Pt worked on static standing balance without UE support with emphasis on symmetric LE weightbearing, correcting lateral lean and R hip/knee extension. Pt propelled w/c back to room and left seated in w/c with needs in reach, chair alarm set and QRB in place.    Therapy Documentation Precautions:  Precautions Precautions: Fall Precaution Comments:  right hemiparesis Restrictions Weight Bearing Restrictions: No   See Function Navigator for Current Functional Status.   Therapy/Group: Individual Therapy  Netta Corrigan, PT, DPT 09/05/2017, 7:49 AM

## 2017-09-05 NOTE — Progress Notes (Signed)
Speech Language Pathology Weekly Progress and Session Note  Patient Details  Name: [name] MRN: [id number] Date of Birth: 02-14-1961  Beginning of progress report period:  August 29, 2017  End of progress report period:  September 05, 2017   Today's Date: 09/05/2017 SLP Individual Time: 0900-1000 SLP Individual Time Calculation (min): 60 min  Short Term Goals: Week 1: SLP Short Term Goal 1 (Week 1): Pt will use compensatory strategies to facilitate mildly complex word finding at the conversational level with supervision verbal cues.  SLP Short Term Goal 1 - Progress (Week 1): Met SLP Short Term Goal 2 (Week 1): Pt will consume therapeutic trials of regular textures with mod I use of swallowing precautions to clear residue from the oral cavity.   SLP Short Term Goal 2 - Progress (Week 1): Met SLP Short Term Goal 3 (Week 1): Pt will utilize chin tuck when consuming thin liquids with mod I in >75% of opportunities  SLP Short Term Goal 3 - Progress (Week 1): Met SLP Short Term Goal 4 (Week 1): Pt will slow rate and overarticulate to achieve intelligibility at the conversational level with mod I  SLP Short Term Goal 4 - Progress (Week 1): Met SLP Short Term Goal 5 (Week 1): Pt will identify at least 2 potential barriers to discharge and generate solutions with min question cues.   SLP Short Term Goal 5 - Progress (Week 1): Met    New Short Term Goals: Week 2: SLP Short Term Goal 1 (Week 2): Pt will use compensatory strategies to facilitate moderately complex word finding at the conversational level with supervision verbal cues.  SLP Short Term Goal 2 (Week 2): Pt will identify at least 2 potential barriers to discharge and generate solutions with supervision question cues.   SLP Short Term Goal 3 (Week 2): Pt will complete semi-complex tasks with supervision cues for functional problem solving  SLP Short Term Goal 4 (Week 2): Pt will return demonstration of at least 2 safety  precautions with supervision.   SLP Short Term Goal 5 (Week 2): Pt will consume regular textures and thin liquids with mod I use of swallowing precautions over 2 targeted sessions  Weekly Progress Updates:  Pt has made functional gains this reporting period and has met 5 out of 5 short term goals.  Pt's diet has been advanced to regular textures and thin liquids which pt is consuming with supervision-mod I use of swallowing precautions.  Pt has demonstrated improved word finding for mildly complex objects and concepts with supervision but needs up to min assist for more complex information.  She still has decreased safety awareness and is impulsive in her mobility but she is demonstrating improved awareness of her limitations.  As a result, pt would continue to benefit from skilled ST while inpatient in order to maximize functional independence and reduce burden of care prior to discharge.  Anticipate that pt will need 24/7 supervision at discharge in addition to ST follow up at next level of care.  Pt and family education is ongoing.     Intensity: Minumum of 1-2 x/day, 30 to 90 minutes Frequency: 3 to 5 out of 7 days Duration/Length of Stay: 21 days  Treatment/Interventions: Cueing hierarchy;Functional tasks;Patient/family education;Environmental controls;Dysphagia/aspiration precaution training;Internal/external aids   Daily Session  Skilled Therapeutic Interventions: Pt was seen for skilled ST targeting goals for communication.  SLP facilitated the session with a verbal reasoning task to address higher level intelligibility and word finding.  Pt needed  min assist verbal cues for moderately complex word finding and topic maintenance due circumlocution.  Pt was intelligible at the conversational level with mod I.  Pt was returned to room and left in bed with bed alarm set and call bell within reach.  Goals updated on this date to reflect current progress and plan of care.       Function:    Eating Eating            Cognition Comprehension Comprehension assist level: Understands complex 90% of the time/cues 10% of the time  Expression   Expression assist level: Expresses basic 90% of the time/requires cueing < 10% of the time.  Social Interaction Social Interaction assist level: Interacts appropriately 90% of the time - Needs monitoring or encouragement for participation or interaction.  Problem Solving Problem solving assist level: Solves basic 90% of the time/requires cueing < 10% of the time  Memory Memory assist level: Recognizes or recalls 90% of the time/requires cueing < 10% of the time   General    Pain Pain Assessment Pain Assessment: No/denies pain  Therapy/Group: Individual Therapy  Pinkey Mcjunkin, Melanee Spry 09/05/2017, 12:24 PM

## 2017-09-06 ENCOUNTER — Inpatient Hospital Stay (HOSPITAL_COMMUNITY): Payer: Self-pay | Admitting: Occupational Therapy

## 2017-09-06 ENCOUNTER — Encounter (HOSPITAL_COMMUNITY): Payer: Self-pay | Admitting: Psychology

## 2017-09-06 ENCOUNTER — Inpatient Hospital Stay (HOSPITAL_COMMUNITY): Payer: Self-pay | Admitting: Speech Pathology

## 2017-09-06 ENCOUNTER — Inpatient Hospital Stay (HOSPITAL_COMMUNITY): Payer: Self-pay

## 2017-09-06 LAB — CBC WITH DIFFERENTIAL/PLATELET
BASOS PCT: 0 %
Basophils Absolute: 0 10*3/uL (ref 0.0–0.1)
EOS PCT: 1 %
Eosinophils Absolute: 0.2 10*3/uL (ref 0.0–0.7)
HEMATOCRIT: 43.9 % (ref 36.0–46.0)
Hemoglobin: 14.8 g/dL (ref 12.0–15.0)
LYMPHS PCT: 36 %
Lymphs Abs: 6.1 10*3/uL — ABNORMAL HIGH (ref 0.7–4.0)
MCH: 31.9 pg (ref 26.0–34.0)
MCHC: 33.7 g/dL (ref 30.0–36.0)
MCV: 94.6 fL (ref 78.0–100.0)
MONO ABS: 1.4 10*3/uL — AB (ref 0.1–1.0)
MONOS PCT: 8 %
NEUTROS PCT: 55 %
Neutro Abs: 9.3 10*3/uL — ABNORMAL HIGH (ref 1.7–7.7)
Platelets: 557 10*3/uL — ABNORMAL HIGH (ref 150–400)
RBC: 4.64 MIL/uL (ref 3.87–5.11)
RDW: 13.6 % (ref 11.5–15.5)
WBC: 17 10*3/uL — AB (ref 4.0–10.5)

## 2017-09-06 LAB — GLUCOSE, CAPILLARY: Glucose-Capillary: 168 mg/dL — ABNORMAL HIGH (ref 65–99)

## 2017-09-06 MED ORDER — FLUOXETINE HCL 10 MG PO CAPS
10.0000 mg | ORAL_CAPSULE | Freq: Every day | ORAL | Status: DC
  Administered 2017-09-07 – 2017-09-13 (×7): 10 mg via ORAL
  Filled 2017-09-06 (×7): qty 1

## 2017-09-06 NOTE — Progress Notes (Signed)
[facility]     PROGRESS NOTE  Subjective/Complaints:  Patient seen lying in bed this morning. She states she slept well overnight. She denies any recurrence of rash.  ROS: Denies CP, SOB, N/V/D.  Objective: Vital Signs: Blood pressure 138/80, pulse 65, temperature (!) 97.1 F (36.2 C), temperature source Oral, resp. rate 16, height 5\' 2"  (1.575 m), weight 58.9 kg (129 lb 13.6 oz), SpO2 95 %. No results found. Recent Labs    09/06/17 0525  WBC 17.0*  HGB 14.8  HCT 43.9  PLT 557*   No results for input(s): NA, K, CL, GLUCOSE, BUN, CREATININE, CALCIUM in the last 72 hours.  Invalid input(s): CO CBG (last 3)  No results for input(s): GLUCAP in the last 72 hours.  Wt Readings from Last 3 Encounters:  08/28/17 58.9 kg (129 lb 13.6 oz)  08/21/17 60.8 kg (134 lb)    Physical Exam:  BP 138/80 (BP Location: Left Arm)   Pulse 65   Temp (!) 97.1 F (36.2 C) (Oral)   Resp 16   Ht 5\' 2"  (1.575 m)   Wt 58.9 kg (129 lb 13.6 oz)   LMP  (LMP Unknown)   SpO2 95%   BMI 23.75 kg/m  Constitutional: She appears well-developed and well-nourished.  HENT: Atraumatic. Normocephalic. Eyes: EOMI. No discharge.  Cardiovascular: RRR. No JVD. Respiratory: Effort normal and breath sounds normal.  GI: Bowel sounds are normal. Non-distended Musculoskeletal: She exhibits no edema or tenderness.  Neurological: She is alert.  Right facial droop  A&Ox3 with increased time Word finding difficulty Motor: Right upper extremity 0/5 proximal to distal  (unchanged) Right lower extremity: 1/5 HF, 0/5 distally No increase in tone Skin: Improving rash  Psychiatric: Her affect is blunt. Her speech is delayed. She is slowed. Cognition and memory are impaired.   Assessment/Plan: 1. 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 Physiatrist is providing close team supervision and 24 hour management of active medical problems listed below. Physiatrist and rehab team continue to assess barriers to discharge/monitor patient progress toward functional and medical goals.  Function:  Bathing Bathing position Bathing activity did not occur: N/A Position: Shower  Bathing parts Body parts bathed by patient: Right arm, Chest, Abdomen, Front perineal area, Right upper leg, Left upper leg, Left lower leg, Right lower leg, Buttocks Body parts bathed by helper: Left arm  Bathing assist        Upper Body Dressing/Undressing Upper body dressing   What is the patient wearing?: Pull over shirt/dress     Pull over shirt/dress - Perfomed by patient: Thread/unthread left sleeve, Put head through opening, Pull shirt over trunk, Thread/unthread right sleeve Pull over shirt/dress - Perfomed by helper: Pull shirt over trunk, Thread/unthread right sleeve        Upper body assist Assist Level: Supervision or verbal cues      Lower Body Dressing/Undressing Lower body dressing   What is the patient wearing?: Pants, Shoes     Pants- Performed by patient: Thread/unthread left pants leg, Thread/unthread right pants leg, Pull pants up/down Pants- Performed by helper: Pull pants up/down, Thread/unthread right pants leg Non-skid slipper socks- Performed by patient: Don/doff right sock Non-skid slipper socks- Performed by helper: Don/doff left sock     Shoes - Performed by patient: Don/doff right shoe, Don/doff left shoe Shoes - Performed by helper: Don/doff right  shoe          Lower body assist Assist for lower body dressing: (2/5, 40%, max assist)      Toileting Toileting   Toileting steps completed by patient: Performs perineal hygiene Toileting steps completed by helper: Adjust clothing prior to toileting, Adjust clothing after toileting Toileting Assistive Devices: Grab bar or rail  Toileting assist Assist  level: Touching or steadying assistance (Pt.75%)   Transfers Chair/bed transfer   Chair/bed transfer method: Stand pivot Chair/bed transfer assist level: Touching or steadying assistance (Pt > 75%) Chair/bed transfer assistive device: Armrests, Bedrails     Locomotion Ambulation     Max distance: 30 ft Assist level: Moderate assist (Pt 50 - 74%)   Wheelchair   Type: Manual Max wheelchair distance: 150' Assist Level: Supervision or verbal cues  Cognition Comprehension Comprehension assist level: Understands complex 90% of the time/cues 10% of the time  Expression Expression assist level: Expresses basic 90% of the time/requires cueing < 10% of the time.  Social Interaction Social Interaction assist level: Interacts appropriately 90% of the time - Needs monitoring or encouragement for participation or interaction.  Problem Solving Problem solving assist level: Solves basic 90% of the time/requires cueing < 10% of the time  Memory Memory assist level: Recognizes or recalls 90% of the time/requires cueing < 10% of the time    Medical Problem List and Plan: 1.  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 Status post loop recorder   Cont CIR    PRAFO/WHO  2.  DVT Prophylaxis/Anticoagulation: SCDs. Bilateral lower extremity Dopplers negative 3. Pain Management: Tylenol as needed 4. Mood: Provide emotional support 5. Neuropsych: This patient is not fully capable of making decisions on her own behalf. 6. Skin/Wound Care: Routine skin checks 7. Fluids/Electrolytes/Nutrition: Routine I&O's   BMP within acceptable range on 12/7 8. Tobacco abuse. Counseling/NicoDerm patch 9. Dysphagia. Follow-up speech therapy.   Advanced to regular diet with supervision 10. Hyperlipidemia. Lipitor 11. Hypertension.    Lopressor 12.5 d/ced due to allergy.    Overall controlled on 12/12 Vitals:   09/05/17 1435 09/06/17 0138  BP: 134/71  138/80  Pulse: 97 65  Resp: 18 16  Temp: 97.8 F (36.6 C) (!) 97.1 F (36.2 C)  SpO2: 94% 95%   12. Transaminitis: Resolved   Labs WNL on 12/7   Cont to monitor 13. Drug allergy   Most likely metoprolol, but was also on ASA.   Metoprolol d/ced   ASA restarted on 12/10, monitor closesly   Change Benadryl and pepcid to po   Will wean steroid further tomorrow 14. Hypoalbuminemia   Supplement initiated on 12/7 15. Leukocytosis   WBCs 17.0 on 12/12   Afebrile, likely steroid effect    Cont to monitor  LOS (Days) 9 A FACE TO FACE EVALUATION WAS PERFORMED  Virat Prather Lorie Phenix 09/06/2017 8:32 AM

## 2017-09-06 NOTE — Progress Notes (Signed)
Occupational Therapy Session Note  Patient Details  Name: [name] MRN: [id number] Date of Birth: 01/02/1961  Today's Date: 09/06/2017 OT Individual Time: 1300-1400 OT Individual Time Calculation (min): 60 min    Short Term Goals: Week 2:  OT Short Term Goal 1 (Week 2): Pt will complete toilet transfers with min assist stand pivot.  OT Short Term Goal 2 (Week 2): Pt will use the RUE as a stabilizer for selfcare tasks with mod assist and no more than min instructional cueing to initiate.   OT Short Term Goal 3 (Week 2): Pt will complete shower transfer with min assist stand pivot. OT Short Term Goal 4 (Week 2): Pt will completed LB dressing sit to stand with supervision.   Skilled Therapeutic Interventions/Progress Updates:    Pt completed bathing at shower level to start session.  Mod assist for ambulation from the wheelchair at bedside to the tub bench.  She was able to complete bathing sit to stand with min assist.  Hemi techniques utilized for washing the LUE.  She completed dressing sit to stand at the sink with min assist and was able to recall hemi techniques with supervision from previous days session.  Took pt in wheelchair to the therapy gym via wheelchair for next part of session.  Therapist applied NMES to right triceps for facilitation of elbow extension.  She was able to tolerate 10 mins at level 24 intensity with PPS set at 35, pulse width at 300.  No adverse reactions to therapy this session.  Pt returned to room at end of session with spouse present and call button/phone in reach.    Therapy Documentation Precautions:  Precautions Precautions: Fall Precaution Comments: right hemiparesis Restrictions Weight Bearing Restrictions: No  Pain: Pain Assessment Pain Assessment: No/denies pain ADL: See Function Navigator for Current Functional Status.   Therapy/Group: Individual Therapy  Leonna Schlee OTR/L 09/06/2017, 2:11 PM

## 2017-09-06 NOTE — Progress Notes (Signed)
Speech Language Pathology Daily Session Note  Patient Details  Name: [name] MRN: [id number] Date of Birth: 12-29-1960  Today's Date: 09/06/2017 SLP Individual Time: 0905-1000 SLP Individual Time Calculation (min): 55 min  Short Term Goals: Week 2: SLP Short Term Goal 1 (Week 2): Pt will use compensatory strategies to facilitate moderately complex word finding at the conversational level with supervision verbal cues.  SLP Short Term Goal 2 (Week 2): Pt will identify at least 2 potential barriers to discharge and generate solutions with supervision question cues.   SLP Short Term Goal 3 (Week 2): Pt will complete semi-complex tasks with supervision cues for functional problem solving  SLP Short Term Goal 4 (Week 2): Pt will return demonstration of at least 2 safety precautions with supervision.   SLP Short Term Goal 5 (Week 2): Pt will consume regular textures and thin liquids with mod I use of swallowing precautions over 2 targeted sessions  Skilled Therapeutic Interventions:  Pt was seen for skilled ST targeting cognitive goals.  SLP facilitated the session with a semi-complex scheduling task to address problem solving strategy use: specifically making lists, highlighting information, and scanning for information.  Pt needed min assist verbal cues for organization and working memory of task components to complete task for 100% accuracy.  Pt presented with questions regarding prognosis; therefore, SLP provided skilled education regarding realistic prognostic expectations for recovery given progress made thusfar and sequelae of stroke recovery.  SLP also discussed measures for stroke prevention, including maintaining a healthy diet, exercising, smoking cessation, and medication compliance with regular medical follow up.  Pt verbalized understanding.  She is still labile at times which is frustrating to her.  Continue to recommend neuropsych.  Pt was able to return demonstration of 1 safety  precaution when transferring back to bed.  Pt was left in bed with bed alarm set and call bell within reach.  Continue per current plan of care.    Function:  Eating Eating   Modified Consistency Diet: No Eating Assist Level: Set up assist for   Eating Set Up Assist For: Opening containers       Cognition Comprehension Comprehension assist level: Understands complex 90% of the time/cues 10% of the time  Expression   Expression assist level: Expresses basic 90% of the time/requires cueing < 10% of the time.  Social Interaction Social Interaction assist level: Interacts appropriately 90% of the time - Needs monitoring or encouragement for participation or interaction.  Problem Solving Problem solving assist level: Solves basic 75 - 89% of the time/requires cueing 10 - 24% of the time  Memory Memory assist level: Recognizes or recalls 75 - 89% of the time/requires cueing 10 - 24% of the time    Pain Pain Assessment Pain Assessment: No/denies pain Pain Score: 0-No pain  Therapy/Group: Individual Therapy  Taneika Choi, Selinda Orion 09/06/2017, 12:14 PM

## 2017-09-06 NOTE — Progress Notes (Signed)
Occupational Therapy Session Note  Patient Details  Name: [name] MRN: [id number] Date of Birth: 11-Jun-1961  Today's Date: 09/06/2017 OT Individual Time: 1501-1530 OT Individual Time Calculation (min): 29 min    Short Term Goals: Week 2:  OT Short Term Goal 1 (Week 2): Pt will complete toilet transfers with min assist stand pivot.  OT Short Term Goal 2 (Week 2): Pt will use the RUE as a stabilizer for selfcare tasks with mod assist and no more than min instructional cueing to initiate.   OT Short Term Goal 3 (Week 2): Pt will complete shower transfer with min assist stand pivot. OT Short Term Goal 4 (Week 2): Pt will completed LB dressing sit to stand with supervision.   Skilled Therapeutic Interventions/Progress Updates:    Pt completed transfers from bed to wheelchair and from wheelchair to and from mat with min assist stand pivot during this session.  Mod instructional cueing for sequencing and for right hand and LE placement prior to transfer.  In the therapy gym had pt in supine to start with work on [facility] of the right UE.  She was able to activate some elbow flexion and extension, but only trace movements.  No digit movements noted however.  Pt completed transition to sitting for work on RUE weightbearing to finish session.  Therapist had her reaching across her body with the LUE in order to increase weightbearing through the RUE, when reaching for bean bags on the table.  Progressed to maintaining squat position to reach and place bags as well with mod facilitation needed for balance.  Returned to room at end of session with call button and phone in reach.  Bed alarm and call button in reach.    Therapy Documentation Precautions:  Precautions Precautions: Fall Precaution Comments: right hemiparesis Restrictions Weight Bearing Restrictions: No  Pain: Pain Assessment Pain Assessment: No/denies pain ADL: See Function Navigator for Current Functional  Status.   Therapy/Group: Individual Therapy  [name] OTR/L 09/06/2017, 3:39 PM

## 2017-09-06 NOTE — Patient Care Conference (Signed)
Inpatient RehabilitationTeam Conference and Plan of Care Update Date: 09/06/2017   Time: 2:20 PM    Patient Name: [name]      Medical Record Number: [id number]  Date of Birth: 1960/10/03 Sex: Female         Room/Bed: 4M02C/4M02C-01 Payor Info: Payor: MEDICAID POTENTIAL / Plan: MEDICAID POTENTIAL / Product Type: *No Product type* /    Admitting Diagnosis: L CVA  Admit Date/Time:  08/28/2017  5:51 PM Admission Comments: No comment available   Primary Diagnosis:  <principal problem not specified> Principal Problem: <principal problem not specified>  Patient Active Problem List   Diagnosis Date Noted  . Drug allergy   . Hypoalbuminemia due to protein-calorie malnutrition ([place])   . Leucocytosis   . Essential hypertension   . Angio-edema   . Dysphagia   . Allergic contact dermatitis due to adhesives   . Transaminitis   . 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 Cerebrovascular accident (CVA) due to occlusion of left middle cerebral artery ([place])   . Right hemiplegia ([place])   . Dysphagia, post-stroke   . Tobacco abuse   . Hyperlipidemia   . Benign essential HTN   . Endotracheally intubated   . Acute respiratory insufficiency   . Acute embolic stroke ([place]) 23/53/6144    Expected Discharge Date: Expected Discharge Date: 09/14/17  Team Members Present: Physician leading conference: Dr. Delice Lesch Social Worker Present: Ovidio Kin, LCSW Nurse Present: Leonette Nutting, RN PT Present: Michaelene Song, PT OT Present: Clyda Greener, OT SLP Present: Windell Moulding, SLP PPS Coordinator present : Daiva Nakayama, RN, CRRN     Current Status/Progress Goal Weekly Team Focus  Medical   Right hemiplegia, dysphagia, hypophonia secondary to acute left MCA infarct status post TPA and thrombectomy.   Improve mobility, safety, transfers, drug allergy, leukocytosis  See above   Bowel/Bladder   Continent of bowel and bladder LBM 09/03/17  remain continent of bowel and bladder  maintain regular bowel pattern  Assist with toileting needs assess need for laxatives   Swallow/Nutrition/ Hydration   regular textures, thin liquids; intermittent supervision   mod I   monitor toleration of regular textures and d/c dysphagia goals    ADL's   Min assist for bathing and dressing sit to stand.  Mod assist for stand pivot transfers to the toilet and walk-in shower.  Can complete squat pivots with min assist.  Brunnstrum stage II in the right arm with stage I in the hand.  Max assist to use the RUE in functional activities.    supervision overall   selfcare retraining, balance retraining, transfer training, neuromuscular re-education, pt/family education   Mobility   min assist transfers, mod assist gait 30 ft with hemiwalker, supervision bed mobility and w/c propulsion  Min A gait, supervision transfers  transfers, gait, safety awareness, stairs   Communication   min assist-supervision   mod I   continue to address education and carryover of compensatory strategies   Safety/Cognition/ Behavioral Observations  min assist-supervision, improving safety awareness, attention, and awareness of deficits    mod I supervision   higher level cognitive tasks, needs family education prior to discharge    Pain   denies any pain at present  pain free or less than 2  Assess q shift and prn   Skin   rash to back   improvement to rash no new areas of concern back rash is clearing up  Assess skin q shift and prn, treatments as  ordered      *See Care Plan and progress notes for long and short-term goals.     Barriers to Discharge  Current Status/Progress Possible Resolutions Date Resolved   Physician    Medical stability     See above  Therapies, drug rash improving, weaning steroids, follow labs      Nursing                  PT  Inaccessible home environment;Home environment access/layout;Behavior                 OT                  SLP                SW                 Discharge Planning/Teaching Needs:  Husband has been here and attended therapies wiht pt, pt is making progress and pleased with this. Neuro-psych seeing for support      Team Discussion:  Progressing toward her goals of min assist level. Rash is better-MD to wean steroids starting tomorrow. Getting shoulder movement back. Labile from CVA to see neuro-psych for coping. Word finding issues-safety Ques if need AFO. Will need husband to do family education prior to discharge.  Revisions to Treatment Plan:  DC 12/20    Continued Need for Acute Rehabilitation Level of Care: The patient requires daily medical management by a physician with specialized training in physical medicine and rehabilitation for the following conditions: Daily direction of a multidisciplinary physical rehabilitation program to ensure safe treatment while eliciting the highest outcome that is of practical value to the patient.: Yes Daily medical management of patient stability for increased activity during participation in an intensive rehabilitation regime.: Yes Daily analysis of laboratory values and/or radiology reports with any subsequent need for medication adjustment of medical intervention for : Neurological problems;Other  Elease Hashimoto 09/06/2017, 2:31 PM

## 2017-09-06 NOTE — Progress Notes (Signed)
Physical Therapy Session Note  Patient Details  Name: [name] MRN: [id number] Date of Birth: 09-11-1961  Today's Date: 09/06/2017 PT Individual Time: 0800-0858 PT Individual Time Calculation (min): 58 min   Short Term Goals: Week 2:  PT Short Term Goal 1 (Week 2): STG=LTG  Skilled Therapeutic Interventions/Progress Updates:    Pt supine in bed upon PT arrival, agreeable to therapy tx and denies pain. Pt transferred supine>sitting and stand pivot transfer to w/c with min assist. Pt propelled w/c to the gym. Pt transferred from w/c to tall kneeling on the mat with mod assist. Pt in tall kneeling with focus on hip extension and postural control, equal LE weightbearing and lateral weightshifting with minimal UE support. Pt worked on balance in tall kneeling while reaching outside BOS with L UE. Pt in modified quadruped on elbows using bench with emphasis on weightbearing through R elbow, performed x 10 hip extension kicks with L LE and x 3 hip extension kicks with R LE. Pt ambulated 2 x 35 ft with min assist using hemi walker, x 1 trial without ace wrap and x 1 trial with DF ace wrap for toe clearance, focus on lateral weightshifting over R LE during stance, maintaining R hip/knee extension during stance, all with manual facilitation. Pt worked on standing dynamic balance and pre gait without AD stepping forward/backwards in place with each LE, requiring mod assist and facilitation for R knee/hip extension. Pt performed x 5 sit<>stands without UE support and min assist. Pt left seated in w/c at end of session with needs in reach.    Therapy Documentation Precautions:  Precautions Precautions: Fall Precaution Comments: right hemiparesis Restrictions Weight Bearing Restrictions: No   See Function Navigator for Current Functional Status.   Therapy/Group: Individual Therapy  Netta Corrigan, PT, DPT 09/06/2017, 7:48 AM

## 2017-09-07 ENCOUNTER — Inpatient Hospital Stay (HOSPITAL_COMMUNITY): Payer: Self-pay | Admitting: Physical Therapy

## 2017-09-07 ENCOUNTER — Inpatient Hospital Stay (HOSPITAL_COMMUNITY): Payer: Self-pay | Admitting: Occupational Therapy

## 2017-09-07 ENCOUNTER — Inpatient Hospital Stay (HOSPITAL_COMMUNITY): Payer: Self-pay | Admitting: Speech Pathology

## 2017-09-07 ENCOUNTER — Ambulatory Visit: Payer: Self-pay

## 2017-09-07 LAB — PATHOLOGIST SMEAR REVIEW

## 2017-09-07 MED ORDER — PREDNISONE 20 MG PO TABS
20.0000 mg | ORAL_TABLET | Freq: Every day | ORAL | Status: DC
  Administered 2017-09-07 – 2017-09-11 (×5): 20 mg via ORAL
  Filled 2017-09-07 (×4): qty 1

## 2017-09-07 NOTE — Progress Notes (Signed)
Social Work Patient ID: [name], female   DOB: 04/15/1961, 56 y.o.   MRN: [id number] Met with pt to discuss team conference progression toward her goals of supervision level. She can see her progress but it is not fast enough for her. Discussed thinking of where she came from her first day. She wants to be home and be assisting her mom and mother in-law. She realizes she needs to focus on her and her recovery but it is still hard. Neuro-psych to see on Monday to assist with her coping. Continue to work on discharge needs.

## 2017-09-07 NOTE — Progress Notes (Signed)
Physical Therapy Session Note  Patient Details  Name: [name] MRN: [id number] Date of Birth: 1961/05/18  Today's Date: 09/07/2017 PT Individual Time: 0800-0910 PT Individual Time Calculation (min): 70 min   Short Term Goals: Week 2:  PT Short Term Goal 1 (Week 2): STG=LTG  Skilled Therapeutic Interventions/Progress Updates:   Pt in w/c upon arrival and agreeable to therapy, no c/o pain. Pt self-propelled w/c to/from gym w/ supervision using L hemi technique, 150' each way. Focused on RLE NMR this session.   Performed 5x sit<>stand w/o UE stabilization support in standing and 5x sit<>stand using hand-over-hand technique w/ min guard. Emphasis on equal weight distribution during sit>stand transfer and stabilizing independently and safely in stance. Verbal and tactile cues for technique.   Worked on dynamic standing balance while performing unilateral UE cognitive task at high/low table w/ min guard, min verbal cues for completion of cognitive task and tactile cues for R quad activation in stance. Emphasis on weight shifting to RLE while reaching across to R side. Pt tolerated standing 1-2 min at a time before needing seated rest 2/2 fatigue. During rest breaks educated pt on exercises she can be doing in between therapy sessions to work on RLE muscle activation including ankle pumps, LAQs and heel slides. Pt able to perform multiple sets of 5 of each exercise within shortened range of motion. Cautioned on performing w/ someone in room w/ her for safety, she verbalized understanding.   Returned to room in w/c and transferred to EOB w/ min assist via stand pivot and ended session in supine, call bell within reach and all needs met.   Therapy Documentation Precautions:  Precautions Precautions: Fall Precaution Comments: right hemiparesis Restrictions Weight Bearing Restrictions: No Vital Signs: Therapy Vitals Temp: 98.5 F (36.9 C) Temp Source: Oral Pulse Rate: 84 Resp: 18 BP:  135/71 Patient Position (if appropriate): Lying Oxygen Therapy SpO2: 97 % O2 Device: Not Delivered  See Function Navigator for Current Functional Status.   Therapy/Group: Individual Therapy  Alli Jasmer K Arnette 09/07/2017, 9:14 AM

## 2017-09-07 NOTE — Progress Notes (Signed)
Occupational Therapy Session Note  Patient Details  Name: [name] MRN: [id number] Date of Birth: [birth year]  Today's Date: 09/07/2017 OT Individual Time: 1300-1409 OT Individual Time Calculation (min): 69 min    Short Term Goals: Week 2:  OT Short Term Goal 1 (Week 2): Pt will complete toilet transfers with min assist stand pivot.  OT Short Term Goal 2 (Week 2): Pt will use the RUE as a stabilizer for selfcare tasks with mod assist and no more than min instructional cueing to initiate.   OT Short Term Goal 3 (Week 2): Pt will complete shower transfer with min assist stand pivot. OT Short Term Goal 4 (Week 2): Pt will completed LB dressing sit to stand with supervision.   Skilled Therapeutic Interventions/Progress Updates:    Pt worked on sit to stand and standing balance at bedside table while engaged in cognitive task of making words out of given letters.  Mod questioning cueing needed to problem solving words and write them correctly.  Emphasis on maintaining right knee and hip extension in standing, with RUE in weightbearing. Had pt work on wheelchair mobility with isolated use of the LEs to propel wheelchair around the day room.  From wheelchair had pt work on making a Christmas card [place] as a stabilizer.  Had pt gather food and snack foods as well from the wheelchair with max hand over hand use to help hold the paper place with the RUE while adding food items.  Max hand over hand with the RUE as well for self feeding 10% of her plate.  Pt's spouse present for session and supportive.  Pt at times demonstrates tearfulness based on current situation and mother-in-law being terminally ill.  Returned to room at end of session via wheelchair with pt left sitting up in wheelchair with call button and phone in reach.    Therapy Documentation Precautions:  Precautions Precautions: Fall Precaution Comments: right hemiparesis Restrictions Weight Bearing Restrictions:  No  Pain: Pain Assessment Pain Assessment: No/denies pain ADL: See Function Navigator for Current Functional Status.   Therapy/Group: Individual Therapy  [name] OTR/L 09/07/2017, 3:46 PM

## 2017-09-07 NOTE — Progress Notes (Signed)
Speech Language Pathology Daily Session Note  Patient Details  Name: [name] MRN: [id number] Date of Birth: May 01, 1961  Today's Date: 09/07/2017 SLP Individual Time: 1000-1100 SLP Individual Time Calculation (min): 60 min  Short Term Goals: Week 2: SLP Short Term Goal 1 (Week 2): Pt will use compensatory strategies to facilitate moderately complex word finding at the conversational level with supervision verbal cues.  SLP Short Term Goal 2 (Week 2): Pt will identify at least 2 potential barriers to discharge and generate solutions with supervision question cues.   SLP Short Term Goal 3 (Week 2): Pt will complete semi-complex tasks with supervision cues for functional problem solving  SLP Short Term Goal 4 (Week 2): Pt will return demonstration of at least 2 safety precautions with supervision.   SLP Short Term Goal 5 (Week 2): Pt will consume regular textures and thin liquids with mod I use of swallowing precautions over 2 targeted sessions  Skilled Therapeutic Interventions:  Pt was seen for skilled ST targeting cognitive goals.  Pt has had ongoing questions regarding changes to her medications and reports "I just can't remember anything."  SLP reviewed current list of scheduled medications and provided pt with an updated handout to facilitate recall in between therapy sessions.  SLP also facilitated the session with a novel card game targeting use of memory compensatory strategies, specifically word-picture associations.  Pt generated word-picture associations with min assist and could recall associations with min assist question cues.  Pt could name specific category members during generative naming portion of task and could then recall 100% of previously named items with supervision.  Pt was returned to room and left in bed with bed alarm set.  Continue per current plan of care.    Function:  Eating Eating   Modified Consistency Diet: No Eating Assist Level: Set up assist for    Eating Set Up Assist For: Opening containers       Cognition Comprehension Comprehension assist level: Follows complex conversation/direction with extra time/assistive device  Expression   Expression assist level: Expresses basic 90% of the time/requires cueing < 10% of the time.  Social Interaction Social Interaction assist level: Interacts appropriately with others - No medications needed.  Problem Solving Problem solving assist level: Solves basic 90% of the time/requires cueing < 10% of the time  Memory Memory assist level: Recognizes or recalls 75 - 89% of the time/requires cueing 10 - 24% of the time    Pain Pain Assessment Pain Assessment: No/denies pain  Therapy/Group: Individual Therapy  Donavyn Fecher, Selinda Orion 09/07/2017, 3:34 PM

## 2017-09-07 NOTE — Progress Notes (Signed)
[facility]IC[facility] RE[facility]ABILITATION     PROGRESS NOTE  Subjective/Complaints:  Patient seen lying in bed this morning. She states she slept well overnight. She denies rash. She notes improvement in strength.  ROS: Denies CP, SOB, N/V/D.  Objective: Vital Signs: Blood pressure 135/71, pulse 84, temperature 98.5 F (36.9 C), temperature source Oral, resp. rate 18, height 5\' 2"  (1.575 m), weight 58.9 kg (129 lb 13.6 oz), SpO2 97 %. No results found. Recent Labs    09/06/17 0525  WBC 17.0*  HGB 14.8  HCT 43.9  PLT 557*   No results for input(s): NA, K, CL, GLUCOSE, BUN, CREATININE, CALCIUM in the last 72 hours.  Invalid input(s): CO CBG (last 3)  Recent Labs    09/06/17 2003  GLUCAP 168*    Wt Readings from Last 3 Encounters:  08/28/17 58.9 kg (129 lb 13.6 oz)  08/21/17 60.8 kg (134 lb)    Physical Exam:  BP 135/71 (BP Location: Left Arm)   Pulse 84   Temp 98.5 F (36.9 C) (Oral)   Resp 18   Ht 5\' 2"  (1.575 m)   Wt 58.9 kg (129 lb 13.6 oz)   LMP  (LMP Unknown)   SpO2 97%   BMI 23.75 kg/m  Constitutional: She appears well-developed and well-nourished.  HENT: Atraumatic. Normocephalic. Eyes: EOMI. No discharge.  Cardiovascular: RRR. No JVD. Respiratory: Effort normal and breath sounds normal.  GI: Bowel sounds are normal. Non-distended Musculoskeletal: She exhibits no edema or tenderness.  Neurological: She is alert.  Right facial droop  A&Ox3 with increased time Word finding difficulty Motor: Right upper extremity: 1+/5 shoulder abduction, distally 0/5  Right lower extremity: Hip flexion 4/5, knee extension 3/5, ADF/PF 2/5  No increase in tone Skin: Improving rash  Psychiatric: Her affect is blunt. Her speech is delayed. She is slowed. Cognition and memory are impaired.   Assessment/Plan: 1. Functional deficits secondary to left MCA infarct status post TPA and thrombectomy which require 3+ hours per day of interdisciplinary therapy in a  comprehensive inpatient rehab setting. Physiatrist is providing close team supervision and 24 hour management of active medical problems listed below. Physiatrist and rehab team continue to assess barriers to discharge/monitor patient progress toward functional and medical goals.  Function:  Bathing Bathing position Bathing activity did not occur: N/A Position: Shower  Bathing parts Body parts bathed by patient: Right arm, Chest, Abdomen, Front perineal area, Right upper leg, Left upper leg, Left lower leg, Right lower leg, Buttocks, Left arm Body parts bathed by helper: Left arm  Bathing assist        Upper Body Dressing/Undressing Upper body dressing   What is the patient wearing?: Pull over shirt/dress     Pull over shirt/dress - Perfomed by patient: Thread/unthread left sleeve, Put head through opening, Pull shirt over trunk, Thread/unthread right sleeve Pull over shirt/dress - Perfomed by helper: Pull shirt over trunk, Thread/unthread right sleeve        Upper body assist Assist Level: Supervision or verbal cues      Lower Body Dressing/Undressing Lower body dressing   What is the patient wearing?: Pants, Shoes     Pants- Performed by patient: Thread/unthread left pants leg, Thread/unthread right pants leg, Pull pants up/down Pants- Performed by helper: Pull pants up/down, Thread/unthread right pants leg Non-skid slipper socks- Performed by patient: Don/doff right sock Non-skid slipper socks- Performed by helper: Don/doff left sock     Shoes - Performed by patient: Don/doff right shoe, Don/doff  left shoe Shoes - Performed by helper: Don/doff right shoe          Lower body assist Assist for lower body dressing: (2/5, 40%, max assist)      Toileting Toileting   Toileting steps completed by patient: Adjust clothing prior to toileting, Adjust clothing after toileting, Performs perineal hygiene Toileting steps completed by helper: Adjust clothing prior to toileting,  Adjust clothing after toileting Toileting Assistive Devices: Grab bar or rail  Toileting assist Assist level: Touching or steadying assistance (Pt.75%)   Transfers Chair/bed transfer   Chair/bed transfer method: Stand pivot Chair/bed transfer assist level: Moderate assist (Pt 50 - 74%/lift or lower) Chair/bed transfer assistive device: Armrests, Bedrails     Locomotion Ambulation     Max distance: 35 ft Assist level: Touching or steadying assistance (Pt > 75%)   Wheelchair   Type: Manual Max wheelchair distance: 150' Assist Level: Supervision or verbal cues  Cognition Comprehension Comprehension assist level: Understands complex 90% of the time/cues 10% of the time  Expression Expression assist level: Expresses basic 90% of the time/requires cueing < 10% of the time.  Social Interaction Social Interaction assist level: Interacts appropriately 90% of the time - Needs monitoring or encouragement for participation or interaction.  Problem Solving Problem solving assist level: Solves basic 75 - 89% of the time/requires cueing 10 - 24% of the time  Memory Memory assist level: Recognizes or recalls 75 - 89% of the time/requires cueing 10 - 24% of the time    Medical Problem List and Plan: 1.  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 Status post loop recorder   Cont CIR    PRAFO/WHO    Fluoxetine started on 12/12 2.  DVT Prophylaxis/Anticoagulation: SCDs. Bilateral lower extremity Dopplers negative 3. Pain Management: Tylenol as needed 4. Mood: Provide emotional support 5. Neuropsych: This patient is not fully capable of making decisions on her own behalf. 6. Skin/Wound Care: Routine skin checks 7. Fluids/Electrolytes/Nutrition: Routine I&O's   BMP within acceptable range on 12/7 8. Tobacco abuse. Counseling/NicoDerm patch 9. Dysphagia. Follow-up speech therapy.   Advanced to regular diet with supervision 10.  Hyperlipidemia. Lipitor 11. Hypertension.    Lopressor 12.5 d/ced due to allergy.    Overall controlled on 12/13 Vitals:   09/06/17 1447 09/07/17 0536  BP: 119/68 135/71  Pulse: 88 84  Resp: 16 18  Temp: 97.7 F (36.5 C) 98.5 F (36.9 C)  SpO2: 95% 97%   12. Transaminitis: Resolved   Labs WNL on 12/7   Cont to monitor 13. Drug allergy   To metoprolol, but was also on ASA.   Metoprolol d/ced   ASA restarted on 12/10, monitor closesly   Change Benadryl and pepcid to po   Weaning steroids on 12/14 14. Hypoalbuminemia   Supplement initiated on 12/7 15. Leukocytosis   WBCs 17.0 on 12/12   Labs ordered for tomorrow   Afebrile, likely steroid effect    Cont to monitor  LOS (Days) 10 A FACE TO FACE EVALUATION WAS PERFORMED  [name] Lorie Phenix 09/07/2017 8:31 AM

## 2017-09-08 ENCOUNTER — Inpatient Hospital Stay (HOSPITAL_COMMUNITY): Payer: Self-pay

## 2017-09-08 ENCOUNTER — Inpatient Hospital Stay (HOSPITAL_COMMUNITY): Payer: Self-pay | Admitting: Speech Pathology

## 2017-09-08 ENCOUNTER — Inpatient Hospital Stay (HOSPITAL_COMMUNITY): Payer: Self-pay | Admitting: Physical Therapy

## 2017-09-08 DIAGNOSIS — G8191 Hemiplegia, unspecified affecting right dominant side: Secondary | ICD-10-CM

## 2017-09-08 LAB — CBC WITH DIFFERENTIAL/PLATELET
BASOS PCT: 0 %
Basophils Absolute: 0 10*3/uL (ref 0.0–0.1)
EOS ABS: 0.4 10*3/uL (ref 0.0–0.7)
EOS PCT: 3 %
HCT: 44 % (ref 36.0–46.0)
Hemoglobin: 14.7 g/dL (ref 12.0–15.0)
Lymphocytes Relative: 36 %
Lymphs Abs: 4.5 10*3/uL — ABNORMAL HIGH (ref 0.7–4.0)
MCH: 31.8 pg (ref 26.0–34.0)
MCHC: 33.4 g/dL (ref 30.0–36.0)
MCV: 95.2 fL (ref 78.0–100.0)
MONO ABS: 1 10*3/uL (ref 0.1–1.0)
MONOS PCT: 8 %
Neutro Abs: 6.8 10*3/uL (ref 1.7–7.7)
Neutrophils Relative %: 53 %
Platelets: 533 10*3/uL — ABNORMAL HIGH (ref 150–400)
RBC: 4.62 MIL/uL (ref 3.87–5.11)
RDW: 13.6 % (ref 11.5–15.5)
WBC: 12.6 10*3/uL — ABNORMAL HIGH (ref 4.0–10.5)

## 2017-09-08 LAB — BASIC METABOLIC PANEL
Anion gap: 6 (ref 5–15)
BUN: 22 mg/dL — AB (ref 6–20)
CALCIUM: 8.9 mg/dL (ref 8.9–10.3)
CO2: 28 mmol/L (ref 22–32)
CREATININE: 0.65 mg/dL (ref 0.44–1.00)
Chloride: 103 mmol/L (ref 101–111)
GFR calc non Af Amer: >60 mL/min (ref >60)
Glucose, Bld: 87 mg/dL (ref 65–99)
Potassium: 3.6 mmol/L (ref 3.5–5.1)
SODIUM: 137 mmol/L (ref 135–145)

## 2017-09-08 NOTE — Progress Notes (Signed)
Physical Therapy Session Note  Patient Details  Name: [name] MRN: [id number] Date of Birth: 1961-08-22  Today's Date: 09/08/2017 PT Individual Time: 0915-1025 PT Individual Time Calculation (min): 70 min   Short Term Goals: Week 2:  PT Short Term Goal 1 (Week 2): STG=LTG  Skilled Therapeutic Interventions/Progress Updates:   Pt sitting up in w/c upon arrival and agreeable to therapy, no c/o pain. Pt self-propelled w/c to therapy gym w/ supervision using L hemi technique. Worked on gait training in gym, performed 30' x2 w/ min assist for gait pattern using hemiwalker, balance, and verbal cues for RLE progression. Pt w/ no episodes of LOB w/ gait this session and demonstrated increased R DF during swing limb advancement, continues to have decreased knee and hip flexion in swing phase. Worked on RLE NMR while performing step-ups to 3" step w/ LUE support on parallel bars. Min guard for safety and verbal cues for increased knee control during eccentric unloading. Practiced negotiating stairs in step-to pattern w/ LUE support on rail. Min assist overall to facilitate weight shifting and verbal cues for technique. Emphasis on RLE control during movement. Focused on UE and postural NMR remainder of session. Performed NuStep 5 min @ L1 to facilitate reciprocal movement pattern of all extremities, total assist for RUE management w/ harness. Performed lateral and anterior/posterior weight shifts in seated at edge of mat w/ manual facilitation of RUE movement in both planes. Utilized UE ranger to facilitate weight bearing in same planes. Tactile cues for posture and postural control during RUE movements. Returned to room total assist in w/c and transferred to EOB and to supine w/ min guard 2/2 pt fatigue. Ended session in supine, call bell within reach and all needs met.   Therapy Documentation Precautions:  Precautions Precautions: Fall Precaution Comments: right hemiparesis Restrictions Weight  Bearing Restrictions: No Pain: Pain Assessment Pain Assessment: No/denies pain Pain Score: 0-No pain  See Function Navigator for Current Functional Status.   Therapy/Group: Individual Therapy  Kamile Fassler K Arnette 09/08/2017, 12:44 PM

## 2017-09-08 NOTE — Progress Notes (Signed)
Physical Therapy Session Note  Patient Details  Name: [name] MRN: [id number] Date of Birth: December 02, 1960  Today's Date: 09/08/2017 PT Individual Time: 1400-1500 PT Individual Time Calculation (min): 60 min   Short Term Goals: Week 2:  PT Short Term Goal 1 (Week 2): STG=LTG  Skilled Therapeutic Interventions/Progress Updates:    Pt supine in bed upon PT arrival, agreeable to therapy tx and denies pain. Pt's husband present for entire session. Pt propelled w/c from room<>gym x 150 ft each way with supervision. Pt ambulated x 40 ft with hemi walker, x 40 ft with quad cane, min assist and emphasis on swing through pattern with AD, tactile and verbal cues for R knee extension, verbal cues for increased step length on L and decreased on R. Pt ambulated x 20 ft with quad cane and husband providing assist, therapist educating pt's husband on proper cues to use and guarding techniques. Pt ascended/descended 2 steps 3 inch with min assist, verbal cues for technique. Pt worked on pre gait with quad cane, emphasis on stance phase with R LE while stepping through with L LE. Pt performed side stepping in place with R LE to work on hip strengthening to minimize scissoring during gait. Therapist discussed pts progress and expectations with both the pt and husband, also answering all questions/concerns. This PT will be reccomending outpatient therapy after d/c Pt left in w/c at end of session with needs in reach and husband present.   Therapy Documentation Precautions:  Precautions Precautions: Fall Precaution Comments: right hemiparesis Restrictions Weight Bearing Restrictions: No   See Function Navigator for Current Functional Status.   Therapy/Group: Individual Therapy  Netta Corrigan, PT, DPT 09/08/2017, 8:00 AM

## 2017-09-08 NOTE — Progress Notes (Signed)
Speech Language Pathology Daily Session Note  Patient Details  Name: [name] MRN: [id number] Date of Birth: January 12, 1961  Today's Date: 09/08/2017 SLP Individual Time: 0806-0901 SLP Individual Time Calculation (min): 55 min  Short Term Goals: Week 2: SLP Short Term Goal 1 (Week 2): Pt will use compensatory strategies to facilitate moderately complex word finding at the conversational level with supervision verbal cues.  SLP Short Term Goal 2 (Week 2): Pt will identify at least 2 potential barriers to discharge and generate solutions with supervision question cues.   SLP Short Term Goal 3 (Week 2): Pt will complete semi-complex tasks with supervision cues for functional problem solving  SLP Short Term Goal 4 (Week 2): Pt will return demonstration of at least 2 safety precautions with supervision.   SLP Short Term Goal 5 (Week 2): Pt will consume regular textures and thin liquids with mod I use of swallowing precautions over 2 targeted sessions  Skilled Therapeutic Interventions:  Pt was seen for skilled ST targeting cognitive goals.  SLP facilitated the session with structured tabletop tasks to address recognition and correction of errors.  Pt was able to identify and correct at least 75% of errors independently which improved to 100% accuracy with supervision verbal cues for attention to detail.  SLP also facilitated the session with an additional scheduling task to further address organization and functional problem solving.  Pt completed task for ~50% accuracy independently and was tasked with fixing remaining errors for homework over the weekend.  Will follow up at next scheduled appointment.  Pt was left in wheelchair with call bell within reach, quick release belt donned, and call bell within reach.  Continue per current plan of care.    Function:  Eating Eating                 Cognition Comprehension Comprehension assist level: Follows basic conversation/direction with  extra time/assistive device  Expression   Expression assist level: Expresses basic 90% of the time/requires cueing < 10% of the time.  Social Interaction Social Interaction assist level: Interacts appropriately with others with medication or extra time (anti-anxiety, antidepressant).  Problem Solving Problem solving assist level: Solves basic 75 - 89% of the time/requires cueing 10 - 24% of the time  Memory Memory assist level: Recognizes or recalls 75 - 89% of the time/requires cueing 10 - 24% of the time    Pain Pain Assessment Pain Assessment: No/denies pain  Therapy/Group: Individual Therapy  Deyci Gesell, Selinda Orion 09/08/2017, 9:02 AM

## 2017-09-08 NOTE — Progress Notes (Signed)
[place] PHYSICAL MEDICINE & REHABILITATION     PROGRESS NOTE  Subjective/Complaints:  Patient seen lying in bed this morning. She states she slept well overnight. She states the itching is improving.  ROS: Denies CP, SOB, N/V/D.  Objective: Vital Signs: Blood pressure (!) 108/59, pulse 61, temperature 97.9 F (36.6 C), temperature source Oral, resp. rate 16, height 5\' 2"  (1.575 m), weight 58.9 kg (129 lb 13.6 oz), SpO2 99 %. No results found. Recent Labs    09/06/17 0525 09/08/17 0530  WBC 17.0* 12.6*  HGB 14.8 14.7  HCT 43.9 44.0  PLT 557* 533*   Recent Labs    09/08/17 0530  NA 137  K 3.6  CL 103  GLUCOSE 87  BUN 22*  CREATININE 0.65  CALCIUM 8.9   CBG (last 3)  Recent Labs    09/06/17 2003  GLUCAP 168*    Wt Readings from Last 3 Encounters:  08/28/17 58.9 kg (129 lb 13.6 oz)  08/21/17 60.8 kg (134 lb)    Physical Exam:  BP (!) 108/59 (BP Location: Left Arm)   Pulse 61   Temp 97.9 F (36.6 C) (Oral)   Resp 16   Ht 5\' 2"  (1.575 m)   Wt 58.9 kg (129 lb 13.6 oz)   LMP  (LMP Unknown)   SpO2 99%   BMI 23.75 kg/m  Constitutional: She appears well-developed and well-nourished.  HENT: Atraumatic. Normocephalic. Eyes: EOMI. No discharge.  Cardiovascular: RRR. No JVD. Respiratory: Effort normal and breath sounds normal.  GI: Bowel sounds are normal. Non-distended Musculoskeletal: She exhibits no edema or tenderness.  Neurological: She is alert.  Right facial droop  A&Ox3 with increased time Word finding difficulty Motor: Right upper extremity: 1+/5 shoulder abduction, distally 0/5 (stable) Right lower extremity: Hip flexion 4/5, knee extension 3/5, ADF/PF 2/5  No increase in tone Skin: Improving rash  Psychiatric: Her affect is blunt. Her speech is delayed. She is slowed. Cognition and memory are impaired.   Assessment/Plan: 1. Functional deficits secondary to left MCA infarct status post TPA and thrombectomy which require 3+ hours per day of  interdisciplinary therapy in a comprehensive inpatient rehab setting. Physiatrist is providing close team supervision and 24 hour management of active medical problems listed below. Physiatrist and rehab team continue to assess barriers to discharge/monitor patient progress toward functional and medical goals.  Function:  Bathing Bathing position Bathing activity did not occur: N/A Position: Shower  Bathing parts Body parts bathed by patient: Right arm, Chest, Abdomen, Front perineal area, Right upper leg, Left upper leg, Left lower leg, Right lower leg, Buttocks, Left arm Body parts bathed by helper: Left arm  Bathing assist        Upper Body Dressing/Undressing Upper body dressing   What is the patient wearing?: Pull over shirt/dress     Pull over shirt/dress - Perfomed by patient: Thread/unthread left sleeve, Put head through opening, Pull shirt over trunk, Thread/unthread right sleeve Pull over shirt/dress - Perfomed by helper: Pull shirt over trunk, Thread/unthread right sleeve        Upper body assist Assist Level: Supervision or verbal cues      Lower Body Dressing/Undressing Lower body dressing   What is the patient wearing?: Pants, Shoes     Pants- Performed by patient: Thread/unthread left pants leg, Thread/unthread right pants leg, Pull pants up/down Pants- Performed by helper: Pull pants up/down, Thread/unthread right pants leg Non-skid slipper socks- Performed by patient: Don/doff right sock Non-skid slipper socks- Performed by helper:  Don/doff left sock     Shoes - Performed by patient: Don/doff right shoe, Don/doff left shoe Shoes - Performed by helper: Don/doff right shoe          Lower body assist Assist for lower body dressing: (2/5, 40%, max assist)      Toileting Toileting   Toileting steps completed by patient: Adjust clothing prior to toileting Toileting steps completed by helper: Adjust clothing prior to toileting, Performs perineal hygiene,  Adjust clothing after toileting Toileting Assistive Devices: Grab bar or rail  Toileting assist Assist level: Touching or steadying assistance (Pt.75%)   Transfers Chair/bed transfer   Chair/bed transfer method: Stand pivot Chair/bed transfer assist level: Touching or steadying assistance (Pt > 75%) Chair/bed transfer assistive device: Armrests, Bedrails     Locomotion Ambulation     Max distance: 35 ft Assist level: Touching or steadying assistance (Pt > 75%)   Wheelchair   Type: Manual Max wheelchair distance: 150' Assist Level: Supervision or verbal cues  Cognition Comprehension Comprehension assist level: Follows complex conversation/direction with extra time/assistive device  Expression Expression assist level: Expresses basic 90% of the time/requires cueing < 10% of the time.  Social Interaction Social Interaction assist level: Interacts appropriately with others - No medications needed.  Problem Solving Problem solving assist level: Solves basic 90% of the time/requires cueing < 10% of the time  Memory Memory assist level: Recognizes or recalls 75 - 89% of the time/requires cueing 10 - 24% of the time    Medical Problem List and Plan: 1.  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 Status post loop recorder   Cont CIR    PRAFO/WHO    Fluoxetine started on 12/12 2.  DVT Prophylaxis/Anticoagulation: SCDs. Bilateral lower extremity Dopplers negative 3. Pain Management: Tylenol as needed 4. Mood: Provide emotional support 5. Neuropsych: This patient is not fully capable of making decisions on her own behalf. 6. Skin/Wound Care: Routine skin checks 7. Fluids/Electrolytes/Nutrition: Routine I&O's   BMP within acceptable range on 12/7 8. Tobacco abuse. Counseling/NicoDerm patch 9. Dysphagia. Follow-up speech therapy.   Advanced to regular diet with supervision 10. Hyperlipidemia. Lipitor 11. Hypertension.     Lopressor 12.5 d/ced due to allergy.    Overall controlled on 12/14 Vitals:   09/08/17 0540 09/08/17 0620  BP:  (!) 108/59  Pulse: 61   Resp: 16   Temp: 97.9 F (36.6 C)   SpO2: 99%    12. Transaminitis: Resolved   Labs WNL on 12/7   Cont to monitor 13. Drug allergy   To metoprolol, but was also on ASA.   Metoprolol d/ced   ASA restarted on 12/10, monitor closesly   Change Benadryl and pepcid to po   Weaning steroids on 12/14 14. Hypoalbuminemia   Supplement initiated on 12/7 15. Leukocytosis   WBCs 12.6 on 12/14   Afebrile, steroid effect    Cont to monitor  LOS (Days) 11 A FACE TO FACE EVALUATION WAS PERFORMED  Ankit Lorie Phenix 09/08/2017 8:52 AM

## 2017-09-09 ENCOUNTER — Inpatient Hospital Stay (HOSPITAL_COMMUNITY): Payer: Self-pay | Admitting: Occupational Therapy

## 2017-09-09 NOTE — Progress Notes (Signed)
[facility] PHYSICAL MEDICINE & REHABILITATION     PROGRESS NOTE  Subjective/Complaints:  Patient lying in bed without new complaints.  She is frustrated somewhat that her right arm is still not "working".  ROS: pt denies nausea, vomiting, diarrhea, cough, shortness of breath or chest pain   Objective: Vital Signs: Blood pressure 109/67, pulse 81, temperature 97.8 F (36.6 C), temperature source Oral, resp. rate 18, height 5\' 2"  (1.575 m), weight 58.9 kg (129 lb 13.6 oz), SpO2 95 %. No results found. Recent Labs    09/08/17 0530  WBC 12.6*  HGB 14.7  HCT 44.0  PLT 533*   Recent Labs    09/08/17 0530  NA 137  K 3.6  CL 103  GLUCOSE 87  BUN 22*  CREATININE 0.65  CALCIUM 8.9   CBG (last 3)  Recent Labs    09/06/17 2003  GLUCAP 168*    Wt Readings from Last 3 Encounters:  08/28/17 58.9 kg (129 lb 13.6 oz)  08/21/17 60.8 kg (134 lb)    Physical Exam:  BP 109/67 (BP Location: Left Arm)   Pulse 81   Temp 97.8 F (36.6 C) (Oral)   Resp 18   Ht 5\' 2"  (1.575 m)   Wt 58.9 kg (129 lb 13.6 oz)   LMP  (LMP Unknown)   SpO2 95%   BMI 23.75 kg/m  Constitutional: She appears well-developed and well-nourished.  HENT: Atraumatic. Normocephalic. Eyes: EOMI. No discharge.  Cardiovascular:RRR without murmur. No JVD  Respiratory: Effort normal and breath sounds normal.  GI: Bowel sounds are normal. Non-distended Musculoskeletal: She exhibits no edema or tenderness.  Neurological: She is alert.  Right facial droop  A&Ox3 with increased time Word finding difficulty Motor: Right upper extremity: 1- 1+/5 shoulder abduction, distally 0/5 (stable) Right lower extremity: Hip flexion 4/5, knee extension 3/5, ADF/PF 2/5  No increase in tone Skin: Improving rash  Psychiatric: Affect is pleasant and appropriate   Assessment/Plan: 1. 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 Physiatrist is providing close team supervision and 24 hour management of active medical problems listed below. Physiatrist and rehab team continue to assess barriers to discharge/monitor patient progress toward functional and medical goals.  Function:  Bathing Bathing position Bathing activity did not occur: N/A Position: Shower  Bathing parts Body parts bathed by patient: Right arm, Chest, Abdomen, Front perineal area, Right upper leg, Left upper leg, Left lower leg, Right lower leg, Buttocks, Left arm Body parts bathed by helper: Left arm  Bathing assist        Upper Body Dressing/Undressing Upper body dressing   What is the patient wearing?: Pull over shirt/dress     Pull over shirt/dress - Perfomed by patient: Thread/unthread left sleeve, Put head through opening, Pull shirt over trunk, Thread/unthread right sleeve Pull over shirt/dress - Perfomed by helper: Pull shirt over trunk, Thread/unthread right sleeve        Upper body assist Assist Level: Supervision or verbal cues      Lower Body Dressing/Undressing Lower body dressing   What is the patient wearing?: Pants, Shoes     Pants- Performed by patient: Thread/unthread left pants leg, Thread/unthread right pants leg, Pull pants up/down Pants- Performed by helper: Pull pants up/down, Thread/unthread right pants leg Non-skid slipper socks- Performed by patient: Don/doff right sock Non-skid slipper socks- Performed by helper: Don/doff left sock  Shoes - Performed by patient: Don/doff right shoe, Don/doff left shoe Shoes - Performed by helper: Don/doff right shoe          Lower body assist Assist for lower body dressing: (2/5, 40%, max assist)      Toileting Toileting   Toileting steps completed by patient: Adjust clothing prior to toileting Toileting steps completed by helper: Adjust clothing prior to toileting, Performs perineal hygiene, Adjust clothing after  toileting Toileting Assistive Devices: Grab bar or rail  Toileting assist Assist level: Touching or steadying assistance (Pt.75%)   Transfers Chair/bed transfer   Chair/bed transfer method: Stand pivot Chair/bed transfer assist level: Supervision or verbal cues Chair/bed transfer assistive device: Armrests, Other(hemiwalker)     Locomotion Ambulation     Max distance: 40 ft Assist level: Touching or steadying assistance (Pt > 75%)   Wheelchair   Type: Manual Max wheelchair distance: 150' Assist Level: Supervision or verbal cues  Cognition Comprehension Comprehension assist level: Follows complex conversation/direction with extra time/assistive device  Expression Expression assist level: Expresses basic 90% of the time/requires cueing < 10% of the time.  Social Interaction Social Interaction assist level: Interacts appropriately with others - No medications needed.  Problem Solving Problem solving assist level: Solves basic 90% of the time/requires cueing < 10% of the time  Memory Memory assist level: Recognizes or recalls 75 - 89% of the time/requires cueing 10 - 24% of the time    Medical Problem List and Plan: 1.  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 Status post loop recorder   Cont CIR    PRAFO/WHO      2.  DVT Prophylaxis/Anticoagulation: SCDs. Bilateral lower extremity Dopplers negative 3. Pain Management: Tylenol as needed 4. Mood: Provide emotional support   -Fluoxetine started 12/12 5. Neuropsych: This patient is not fully capable of making decisions on her own behalf. 6. Skin/Wound Care: Routine skin checks 7. Fluids/Electrolytes/Nutrition: Routine I&O's   BMP within acceptable range on 12/7 8. Tobacco abuse. Counseling/NicoDerm patch 9. Dysphagia. Follow-up speech therapy.   Advanced to regular diet with supervision 10. Hyperlipidemia. Lipitor 11. Hypertension.    Lopressor 12.5 d/ced due  to allergy.    Overall controlled on 12/15 Vitals:   09/08/17 1353 09/09/17 0459  BP: 135/61 109/67  Pulse: 68 81  Resp: 18 18  Temp: 97.7 F (36.5 C) 97.8 F (36.6 C)  SpO2: 93% 95%   12. Transaminitis: Resolved   Labs WNL on 12/7   Cont to monitor 13. Drug allergy   To metoprolol, but was also on ASA.   Metoprolol d/ced   ASA restarted on 12/10, monitor closesly   Change Benadryl and pepcid to po   Weaning steroids on 12/15 14. Hypoalbuminemia   Supplement initiated on 12/7 15. Leukocytosis   WBCs 12.6 on 12/14   Afebrile, steroid effect    Cont to monitor  LOS (Days) 12 A FACE TO FACE EVALUATION WAS PERFORMED  Debbe Crumble T 09/09/2017 8:19 AM

## 2017-09-09 NOTE — Progress Notes (Addendum)
Occupational Therapy Session Note  Patient Details  Name: [name] MRN: [id number] Date of Birth: 1960/11/07  Today's Date: 09/09/2017 OT Individual Time: 3825-0539 OT Individual Time Calculation (min): 44 min   Short Term Goals: Week 2:  OT Short Term Goal 1 (Week 2): Pt will complete toilet transfers with min assist stand pivot.  OT Short Term Goal 2 (Week 2): Pt will use the RUE as a stabilizer for selfcare tasks with mod assist and no more than min instructional cueing to initiate.   OT Short Term Goal 3 (Week 2): Pt will complete shower transfer with min assist stand pivot. OT Short Term Goal 4 (Week 2): Pt will completed LB dressing sit to stand with supervision.   Skilled Therapeutic Interventions/Progress Updates:    Tx focus on Rt NMR, balance, and sit<stands during meaningful IADL engagement.   Pt greeted in w/c with spouse present, agreeable to tx. She was escorted to family room. Pt filling small watering can while standing at sink, then proceeded to water plants around room while standing with support from windowsill and cane as appropriate. During all sit<stand transitions pt required mod vcs on technique and Mod A to support Rt side. Max cues for eccentric control while descending to prevent collapse on Rt. Throughout session, facilitated R UE weightbearing on sink or windowsill. Educated pt/spouse to continue gardening/leisure tasks at home to maintain meaningful life roles. When she was escorted back to room, worked on [employer] in [employer] assisted positions with lotion application. At end of tx pt was repositioned to protect hemiplegic side and left with spouse.    Therapy Documentation Precautions:  Precautions Precautions: Fall Precaution Comments: right hemiparesis Restrictions Weight Bearing Restrictions: No Pain: Min c/o back pain with pt refusing to notify RN for medication. Also refused use of thermotherapy Pain Assessment Pain Assessment: No/denies  pain ADL:      See Function Navigator for Current Functional Status.   Therapy/Group: Individual Therapy  [name] A [name] 09/09/2017, 3:42 PM

## 2017-09-10 NOTE — Progress Notes (Signed)
Occupational Therapy Session Note  Patient Details  Name: [name] MRN: [id number] Date of Birth: 02/04/1961  Today's Date: 09/10/2017 OT Individual Time: 1500-1530 OT Individual Time Calculation (min): 30 min    Short Term Goals: Week 2:  OT Short Term Goal 1 (Week 2): Pt will complete toilet transfers with min assist stand pivot.  OT Short Term Goal 2 (Week 2): Pt will use the RUE as a stabilizer for selfcare tasks with mod assist and no more than min instructional cueing to initiate.   OT Short Term Goal 3 (Week 2): Pt will complete shower transfer with min assist stand pivot. OT Short Term Goal 4 (Week 2): Pt will completed LB dressing sit to stand with supervision.   Skilled Therapeutic Interventions/Progress Updates:    1:1. Pt received in dayroom with family. Pt wants to work on RUE and reports tapping and towel glides worked with some success to activate bicep. Pt completes 2x15 circles, protraction/retraction, horizontal ab/adduction, elbow flexion/extension with min facilitation at end ranged in arm skate for NMR with VC/tactile cues to decrease trunk compensatory  movements. Pt becomes tearful when mentioning mother visiting and role reversal. OT provided support and encouragement to pt. Pt returned to room seated in w/c with call light in reach and all needs met.   Therapy Documentation Precautions:  Precautions Precautions: Fall Precaution Comments: right hemiparesis Restrictions Weight Bearing Restrictions: No  See Function Navigator for Current Functional Status.   Therapy/Group: Individual Therapy  Tonny Branch 09/10/2017, 3:28 PM

## 2017-09-10 NOTE — Progress Notes (Signed)
[facility] PHYSICAL MEDICINE & REHABILITATION     PROGRESS NOTE  Subjective/Complaints:  Patient lying in bed without complaints.  Just being "lazy"  ROS: pt denies nausea, vomiting, diarrhea, cough, shortness of breath or chest pain   Objective: Vital Signs: Blood pressure 107/62, pulse 83, temperature 98 F (36.7 C), temperature source Oral, resp. rate 18, height 5\' 2"  (1.575 m), weight 58.9 kg (129 lb 13.6 oz), SpO2 97 %. No results found. Recent Labs    09/08/17 0530  WBC 12.6*  HGB 14.7  HCT 44.0  PLT 533*   Recent Labs    09/08/17 0530  NA 137  K 3.6  CL 103  GLUCOSE 87  BUN 22*  CREATININE 0.65  CALCIUM 8.9   CBG (last 3)  No results for input(s): GLUCAP in the last 72 hours.  Wt Readings from Last 3 Encounters:  08/28/17 58.9 kg (129 lb 13.6 oz)  08/21/17 60.8 kg (134 lb)    Physical Exam:  BP 107/62 (BP Location: Left Arm)   Pulse 83   Temp 98 F (36.7 C) (Oral)   Resp 18   Ht 5\' 2"  (1.575 m)   Wt 58.9 kg (129 lb 13.6 oz)   LMP  (LMP Unknown)   SpO2 97%   BMI 23.75 kg/m  Constitutional: She appears well-developed and well-nourished.  HENT: Atraumatic. Normocephalic. Eyes: EOMI. No discharge.  Cardiovascular: RRR without murmur. No JVD   Respiratory: normal effort.  GI: Bowel sounds are normal. Non-distended Musculoskeletal: She exhibits no edema or tenderness.  Neurological: She is alert.  Right facial droop  A&Ox3 with increased time Word finding difficulty Motor: Right upper extremity: 1- 1+/5 shoulder abduction, distally 0/5 (no change) Right lower extremity: Hip flexion 4/5, knee extension 3/5, ADF/PF 2/5  No increase in tone Skin: Improving rash  Psychiatric: Affect is pleasant and appropriate   Assessment/Plan: 1. Functional deficits secondary to left MCA infarct status post TPA and thrombectomy which require 3+ hours per day of interdisciplinary therapy in a comprehensive inpatient rehab setting. Physiatrist is providing close  team supervision and 24 hour management of active medical problems listed below. Physiatrist and rehab team continue to assess barriers to discharge/monitor patient progress toward functional and medical goals.  Function:  Bathing Bathing position Bathing activity did not occur: N/A Position: Shower  Bathing parts Body parts bathed by patient: Right arm, Chest, Abdomen, Front perineal area, Right upper leg, Left upper leg, Left lower leg, Right lower leg, Buttocks, Left arm Body parts bathed by helper: Left arm  Bathing assist        Upper Body Dressing/Undressing Upper body dressing   What is the patient wearing?: Pull over shirt/dress     Pull over shirt/dress - Perfomed by patient: Thread/unthread left sleeve, Put head through opening, Pull shirt over trunk, Thread/unthread right sleeve Pull over shirt/dress - Perfomed by helper: Pull shirt over trunk, Thread/unthread right sleeve        Upper body assist Assist Level: Supervision or verbal cues      Lower Body Dressing/Undressing Lower body dressing   What is the patient wearing?: Pants, Shoes     Pants- Performed by patient: Thread/unthread left pants leg, Thread/unthread right pants leg, Pull pants up/down Pants- Performed by helper: Pull pants up/down, Thread/unthread right pants leg Non-skid slipper socks- Performed by patient: Don/doff right sock Non-skid slipper socks- Performed by helper: Don/doff left sock     Shoes - Performed by patient: Don/doff right shoe, Don/doff left shoe Shoes -  Performed by helper: Don/doff right shoe          Lower body assist Assist for lower body dressing: (2/5, 40%, max assist)      Toileting Toileting   Toileting steps completed by patient: Adjust clothing prior to toileting Toileting steps completed by helper: Adjust clothing prior to toileting, Performs perineal hygiene, Adjust clothing after toileting Toileting Assistive Devices: Grab bar or rail  Toileting assist Assist  level: Touching or steadying assistance (Pt.75%)   Transfers Chair/bed transfer   Chair/bed transfer method: Stand pivot Chair/bed transfer assist level: Supervision or verbal cues Chair/bed transfer assistive device: Armrests, Other(hemiwalker)     Locomotion Ambulation     Max distance: 40 ft Assist level: Touching or steadying assistance (Pt > 75%)   Wheelchair   Type: Manual Max wheelchair distance: 150' Assist Level: Supervision or verbal cues  Cognition Comprehension Comprehension assist level: Follows complex conversation/direction with extra time/assistive device  Expression Expression assist level: Expresses basic 90% of the time/requires cueing < 10% of the time.  Social Interaction Social Interaction assist level: Interacts appropriately with others - No medications needed.  Problem Solving Problem solving assist level: Solves basic 90% of the time/requires cueing < 10% of the time  Memory Memory assist level: Recognizes or recalls 75 - 89% of the time/requires cueing 10 - 24% of the time    Medical Problem List and Plan: 1.  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 Status post loop recorder   Cont CIR    PRAFO/WHO      2.  DVT Prophylaxis/Anticoagulation: SCDs. Bilateral lower extremity Dopplers negative 3. Pain Management: Tylenol as needed 4. Mood: Provide emotional support   -Fluoxetine started 12/12.  Affect has been appropriate during my visits 5. Neuropsych: This patient is not fully capable of making decisions on her own behalf. 6. Skin/Wound Care: Routine skin checks 7. Fluids/Electrolytes/Nutrition: Routine I&O's   BMP within acceptable range on 12/7 8. Tobacco abuse. Counseling/NicoDerm patch 9. Dysphagia. Follow-up speech therapy.   Advanced to regular diet with supervision 10. Hyperlipidemia. Lipitor 11. Hypertension.    Lopressor 12.5 d/ced due to allergy.    Overall controlled  on 12/16 Vitals:   09/09/17 1620 09/10/17 0609  BP: (!) 133/96 107/62  Pulse: 78 83  Resp: 18 18  Temp: 97.7 F (36.5 C) 98 F (36.7 C)  SpO2: 96% 97%   12. Transaminitis: Resolved   Labs WNL on 12/7   Cont to monitor 13. Drug allergy   To metoprolol, but was also on ASA.   Metoprolol d/ced   ASA restarted on 12/10, monitor closesly   Change Benadryl and pepcid to po   Weaning steroids on 12/15 14. Hypoalbuminemia   Supplement initiated on 12/7 15. Leukocytosis   WBCs 12.6 on 12/14   Afebrile, steroid effect    Cont to monitor  LOS (Days) 13 A FACE TO FACE EVALUATION WAS PERFORMED  Jameah Rouser T 09/10/2017 8:05 AM

## 2017-09-11 ENCOUNTER — Encounter (HOSPITAL_COMMUNITY): Payer: Self-pay | Admitting: Psychology

## 2017-09-11 ENCOUNTER — Inpatient Hospital Stay (HOSPITAL_COMMUNITY): Payer: Self-pay | Admitting: Occupational Therapy

## 2017-09-11 ENCOUNTER — Inpatient Hospital Stay (HOSPITAL_COMMUNITY): Payer: Self-pay | Admitting: Speech Pathology

## 2017-09-11 ENCOUNTER — Inpatient Hospital Stay (HOSPITAL_COMMUNITY): Payer: Self-pay

## 2017-09-11 DIAGNOSIS — F329 Major depressive disorder, single episode, unspecified: Secondary | ICD-10-CM

## 2017-09-11 MED ORDER — PREDNISONE 10 MG PO TABS
10.0000 mg | ORAL_TABLET | Freq: Every day | ORAL | Status: DC
  Administered 2017-09-12: 10 mg via ORAL
  Filled 2017-09-11 (×2): qty 1

## 2017-09-11 NOTE — Consult Note (Signed)
Neuropsychological Consultation   Patient:   [name]   DOB:   09/14/1961  MR Number:  [id number]  Location:  [place] [address] [facility] [address] [id number] [place] [address] Dept: [place]: [phone]           Date of Service:   09/11/2017  Start Time:   8 AM End Time:   9 AM  Provider/Observer:  Ilean Skill, Psy.D.       Clinical Neuropsychologist       Billing Code/Service: [phone] 4 Units  Chief Complaint:    [name] is a 56 year old female with generally good health history other than tobacco abuse.  Presented 08/21/2017 with right sided weakness and changes in speech.  CT showed left CVA.  TPA administered due to left ICA and M1 thrombolytic stroke.  Patient has shown return of expressive language and some mild improvement in right leg functioning but little return of right arm motor functioning.  Patent has had reactive depression symptoms with sudden emotional response (crying) when talking about loss of function as well as other family situations she is concerned she will not be able to help with including medical issues with mother in law and aging mother.    Reason for Service:  [name] was referred for a neuropsychological evaluation due to coping issues and reactive depressive symptoms.  Below is the HPI for the current admission.   HPI: [name] a 30 y.o.right handed femalewith unremarkable past medical history except tobacco abuse on no prescription medications. Per chart review and family, patient lives with spouse. Independent prior to admission. Presented 08/21/2017 to [facility] hospital with right-sided weakness and slurred speech. CT of the head reviewed, showing left CVA.  Per report, small acute infarct left insula, left putamen and possibly left parietal cortex. Hyperdense distal left ICA and M1 segment consistent with thrombus. CT angiogram  head and neck showed positive emergent large vessel occlusion. Severe atheromatous stenosis of the left ICA bulb felt to be embolic source. Patient did receive TPA. Patient transferred to [facility] underwent revascularization per interventional radiology. Latest MRI shows acute infarct left temporal operculum, left posterior insula, left posterior lentiform nuclear medicine extending into the corona radiata and caudate body. Biparietal white matter microhemorrhage felt to be chronic. Echocardiogram with ejection fraction of 60% no wall motion abnormalities. Lower extremity Dopplers negative for DVT. Neurology follow-up with TEE that showed LAA without masses no PFO and loop recorder placed 08/28/2017. Presently on aspirin for CVA prophylaxis. Dysphagia #3 thin liquid diet. Physical and occupational therapy evaluations completed 08/24/2017 with recommendations of physical medicine rehabilitation consult. Patient was admitted for a comprehensive rehabilitation program  Current Status:  The patient is having difficulty coping with loss of motor function for right side (hemiparisis) .  She is having sudden crying spells that were not normal for her prior.  May have neurologic component but during session today each response was directly related to current discussion topic.    Behavioral Observation: [name]  presents as a 40 y.o.-year-old Right Caucasian Female who appeared her stated age. her dress was Appropriate and she was Well Groomed and her manners were Appropriate to the situation.  her participation was indicative of Appropriate and Attentive behaviors.  There were physical disabilities noted.  she displayed an appropriate level of cooperation and motivation.     Interactions:    Active Appropriate and  Attentive  Attention:   within normal limits and attention span and concentration were age appropriate  Memory:   abnormal; Patient is having some issues with memory for situation  details, but auditory language is good.  Episodic memory issues (mild).    Visuo-spatial:  not examined  Speech (Volume):  normal  Speech:   normal; normal  Thought Process:  Coherent and Relevant  Though Content:  WNL; not suicidal  Orientation:   person, place, time/date and situation  Judgment:   Good  Planning:   Good  Affect:    Anxious and Depressed  Mood:    Anxious and Depressed  Insight:   Good  Intelligence:   normal  Marital Status/Living: Patient is married with supportive husband and adult children, one of who [place] with patient and husband.  Medical History:  No past medical history on file.          Psychiatric History:  No prior psychiatric history.  Family Med/Psych History:  Family History  Problem Relation Age of Onset  . Heart attack Father     Risk of Suicide/Violence: virtually non-existent   Impression/DX:  [name] is a 56 year old female with generally good health history other than tobacco abuse.  Presented 08/21/2017 with right sided weakness and changes in speech.  CT showed left CVA.  TPA administered due to left ICA and M1 thrombolytic stroke.  Patient has shown return of expressive language and some mild improvement in right leg functioning but little return of right arm motor functioning.  Patent has had reactive depression symptoms with sudden emotional response (crying) when talking about loss of function as well as other family situations she is concerned she will not be able to help with including medical issues with mother in law and aging mother.    The patient is having difficulty coping with loss of motor function for right side (hemiparisis) .  She is having sudden crying spells that were not normal for her prior.  May have neurologic component but during session today each response was directly related to current discussion topic.    Disposition/Plan:  Patient requested that I see her again before discharge is  possible.  Diagnosis:    Left middle cerebral artery stroke [facility]) - Plan: Ambulatory referral to Physical Medicine Rehab         Electronically Signed   _______________________ Ilean Skill, Psy.D.

## 2017-09-11 NOTE — Progress Notes (Signed)
[place] PHYSICAL MEDICINE & REHABILITATION     PROGRESS NOTE  Subjective/Complaints:  Pt seen sitting up in her chair this Am.  She slept well overnight.  She had a good weekend.    ROS: Denies nausea, vomiting, diarrhea, shortness of breath or chest pain   Objective: Vital Signs: Blood pressure (!) 124/55, pulse 74, temperature 98.1 F (36.7 C), temperature source Oral, resp. rate 18, height 5\' 2"  (1.575 m), weight 58.9 kg (129 lb 13.6 oz), SpO2 97 %. No results found. No results for input(s): WBC, HGB, HCT, PLT in the last 72 hours. No results for input(s): NA, K, CL, GLUCOSE, BUN, CREATININE, CALCIUM in the last 72 hours.  Invalid input(s): CO CBG (last 3)  No results for input(s): GLUCAP in the last 72 hours.  Wt Readings from Last 3 Encounters:  08/28/17 58.9 kg (129 lb 13.6 oz)  08/21/17 60.8 kg (134 lb)    Physical Exam:  BP (!) 124/55 (BP Location: Left Arm)   Pulse 74   Temp 98.1 F (36.7 C) (Oral)   Resp 18   Ht 5\' 2"  (1.575 m)   Wt 58.9 kg (129 lb 13.6 oz)   LMP  (LMP Unknown)   SpO2 97%   BMI 23.75 kg/m  Constitutional: She appears well-developed and well-nourished.  HENT: Atraumatic. Normocephalic. Eyes: EOMI. No discharge.  Cardiovascular: RRR. No JVD   Respiratory: Normal effort. Clear. GI: Bowel sounds are normal. Non-distended Musculoskeletal: She exhibits no edema or tenderness.  Neurological: She is alert.  Right facial droop  A&Ox3 with increased time Word finding difficulty Motor: Right upper extremity: 2-/5 shoulder abduction, distally 0/5  Right lower extremity: Hip flexion 4-/5, knee extension 3+/5, ADF/PF 2+/5  No increase in tone Skin: Rash resolved Psychiatric: Affect is pleasant and appropriate   Assessment/Plan: 1. Functional deficits secondary to left MCA infarct status post TPA and thrombectomy which require 3+ hours per day of interdisciplinary therapy in a comprehensive inpatient rehab setting. Physiatrist is providing  close team supervision and 24 hour management of active medical problems listed below. Physiatrist and rehab team continue to assess barriers to discharge/monitor patient progress toward functional and medical goals.  Function:  Bathing Bathing position Bathing activity did not occur: N/A Position: Shower  Bathing parts Body parts bathed by patient: Right arm, Chest, Abdomen, Front perineal area, Right upper leg, Left upper leg, Left lower leg, Right lower leg, Buttocks, Left arm Body parts bathed by helper: Left arm  Bathing assist        Upper Body Dressing/Undressing Upper body dressing   What is the patient wearing?: Pull over shirt/dress     Pull over shirt/dress - Perfomed by patient: Thread/unthread left sleeve, Put head through opening, Pull shirt over trunk, Thread/unthread right sleeve Pull over shirt/dress - Perfomed by helper: Pull shirt over trunk, Thread/unthread right sleeve        Upper body assist Assist Level: Supervision or verbal cues      Lower Body Dressing/Undressing Lower body dressing   What is the patient wearing?: Pants, Shoes     Pants- Performed by patient: Thread/unthread left pants leg, Thread/unthread right pants leg, Pull pants up/down Pants- Performed by helper: Pull pants up/down, Thread/unthread right pants leg Non-skid slipper socks- Performed by patient: Don/doff right sock Non-skid slipper socks- Performed by helper: Don/doff left sock     Shoes - Performed by patient: Don/doff right shoe, Don/doff left shoe Shoes - Performed by helper: Don/doff right shoe  Lower body assist Assist for lower body dressing: (2/5, 40%, max assist)      Toileting Toileting   Toileting steps completed by patient: Adjust clothing prior to toileting Toileting steps completed by helper: Adjust clothing prior to toileting, Performs perineal hygiene, Adjust clothing after toileting Toileting Assistive Devices: Grab bar or rail  Toileting assist  Assist level: Touching or steadying assistance (Pt.75%)   Transfers Chair/bed transfer   Chair/bed transfer method: Stand pivot Chair/bed transfer assist level: Supervision or verbal cues Chair/bed transfer assistive device: Armrests, Bedrails     Locomotion Ambulation     Max distance: 40 ft Assist level: Touching or steadying assistance (Pt > 75%)   Wheelchair   Type: Manual Max wheelchair distance: 150' Assist Level: Supervision or verbal cues  Cognition Comprehension Comprehension assist level: Follows complex conversation/direction with extra time/assistive device  Expression Expression assist level: Expresses basic 90% of the time/requires cueing < 10% of the time.  Social Interaction Social Interaction assist level: Interacts appropriately with others - No medications needed.  Problem Solving Problem solving assist level: Solves basic 90% of the time/requires cueing < 10% of the time  Memory Memory assist level: Recognizes or recalls 75 - 89% of the time/requires cueing 10 - 24% of the time    Medical Problem List and Plan: 1.  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 Status post loop recorder   Cont CIR    PRAFO/WHO     Fluoxetine started 12/12.   2.  DVT Prophylaxis/Anticoagulation: SCDs. Bilateral lower extremity Dopplers negative 3. Pain Management: Tylenol as needed 4. Mood: Provide emotional support 5. Neuropsych: This patient is not fully capable of making decisions on her own behalf. 6. Skin/Wound Care: Routine skin checks 7. Fluids/Electrolytes/Nutrition: Routine I&O's   BMP within acceptable range on 12/14 8. Tobacco abuse. Counseling/NicoDerm patch 9. Dysphagia. Follow-up speech therapy.   Advanced to regular diet with supervision 10. Hyperlipidemia. Lipitor 11. Hypertension.    Lopressor 12.5 d/ced due to allergy.    Overall controlled on 12/17 Vitals:   09/10/17 0609 09/11/17 0546   BP: 107/62 (!) 124/55  Pulse: 83 74  Resp: 18 18  Temp: 98 F (36.7 C) 98.1 F (36.7 C)  SpO2: 97% 97%   12. Transaminitis: Resolved   Labs WNL on 12/7   Cont to monitor 13. Drug allergy   To metoprolol, but was also on ASA.   Metoprolol d/ced   ASA restarted on 12/10, monitor closesly   Change Benadryl and pepcid to po   Weaning steroids on 12/18 again 14. Hypoalbuminemia   Supplement initiated on 12/7 15. Leukocytosis   WBCs 12.6 on 12/14   Afebrile, steroid effect    Cont to monitor  LOS (Days) 14 A FACE TO FACE EVALUATION WAS PERFORMED  Ernesta Trabert Lorie Phenix 09/11/2017 8:45 AM

## 2017-09-11 NOTE — Progress Notes (Signed)
Physical Therapy Session Note  Patient Details  Name: [name] MRN: [id number] Date of Birth: 11/22/1960  Today's Date: 09/11/2017 PT Individual Time: 1415-1525 PT Individual Time Calculation (min): 70 min   Short Term Goals: Week 2:  PT Short Term Goal 1 (Week 2): STG=LTG  Skilled Therapeutic Interventions/Progress Updates:    Pt seated in w/c upon PT arrival, agreeable to therapy tx and denies pain. Pt's husband present for entire session and completed family training. Pt propelled w/c from room<>gym x 150 ft with supervision. Pt ambulated using quad cane: x 60 ft , x 60 ft with DF ace wrap, x 30 ft with R AFO trial, x 65 ft with assist from husband. Pt ascended/descended x 2 steps without handrails with therapist requiring mod assist. Therapist recommended to do stair bumping at home for safety since pt does not have handrails. Therapist demonstrated stair bumping x 2 steps and pt's husband performed stair bumping x 2 steps. Pt practiced getting on/off regular size bed in rehab apartment with her husband providing assist. Pt performed car transfer x 1 with therapist and x 1 with husband. Pt left seated in w/c at end of session with needs in reach.   Therapy Documentation Precautions:  Precautions Precautions: Fall Precaution Comments: right hemiparesis Restrictions Weight Bearing Restrictions: No   See Function Navigator for Current Functional Status.   Therapy/Group: Individual Therapy  Netta Corrigan, PT, DPT 09/11/2017, 12:57 PM

## 2017-09-11 NOTE — Progress Notes (Signed)
Occupational Therapy Session Note  Patient Details  Name: [name] MRN: [id number] Date of Birth: September 26, 1961  Today's Date: 09/11/2017 OT Individual Time: 1100-1215 OT Individual Time Calculation (min): 75 min    Short Term Goals: Week 2:  OT Short Term Goal 1 (Week 2): Pt will complete toilet transfers with min assist stand pivot.  OT Short Term Goal 2 (Week 2): Pt will use the RUE as a stabilizer for selfcare tasks with mod assist and no more than min instructional cueing to initiate.   OT Short Term Goal 3 (Week 2): Pt will complete shower transfer with min assist stand pivot. OT Short Term Goal 4 (Week 2): Pt will completed LB dressing sit to stand with supervision.   Skilled Therapeutic Interventions/Progress Updates: Patient approached for skilled OT.   Husband and patient right away began to share information regarding patient that they feel affect her functional status > such as they stated prednisone causes patient to 'stay up' most of the time; would like to decrease Pepcid as patient stated she wonders if it is keeping her awake at night;  Husband not being able to catch doc during the day as husband states he can not come into [place] at the 'way early hours that the docs are here.'       This clinician suggested husband and wife speak to nurse regarding medication and lack of sleep concerns and inform the nurses they would like to speak to the doc in person or via phone call.  Otherwise, patient participated in sit to stand, standing balance, R UE AAROM,  trunnk stability and practiced donning right shoe (min A this session but may need more help after her right ankle AFO arrives.)  Patient was left in the supportive presence of her husband at the end of the session.     Therapy Documentation Precautions:  Precautions Precautions: Fall Precaution Comments: right hemiparesis Restrictions Weight Bearing Restrictions: No   Pain:denied   See Function Navigator  for Current Functional Status.   Therapy/Group: Individual Therapy  Alfredia Ferguson [facility] 09/11/2017, 2:36 PM

## 2017-09-11 NOTE — Progress Notes (Signed)
Speech Language Pathology Daily Session Note  Patient Details  Name: [name] MRN: [id number] Date of Birth: June 21, 1961  Today's Date: 09/11/2017 SLP Individual Time: 2703-5009 SLP Individual Time Calculation (min): 60 min  Short Term Goals: Week 2: SLP Short Term Goal 1 (Week 2): Pt will use compensatory strategies to facilitate moderately complex word finding at the conversational level with supervision verbal cues.  SLP Short Term Goal 2 (Week 2): Pt will identify at least 2 potential barriers to discharge and generate solutions with supervision question cues.   SLP Short Term Goal 3 (Week 2): Pt will complete semi-complex tasks with supervision cues for functional problem solving  SLP Short Term Goal 4 (Week 2): Pt will return demonstration of at least 2 safety precautions with supervision.   SLP Short Term Goal 5 (Week 2): Pt will consume regular textures and thin liquids with mod I use of swallowing precautions over 2 targeted sessions  Skilled Therapeutic Interventions: Skilled treatment session focused on cognition goals. SLP facilitated session by providing Min A for completion of semi-complex deductive reasoning tasks. SLP provided encouragement and emotion support when discussing potentia barriers d/t frequent crying during conversation. Pt states that she recently saw Neuropsychologist this morning and looks forward to working through issues related to CVA. Of note during general conversation, pt with Min A to Mod A difficulty retrieving words to make descriptions of life sensible. Pt was returned to room,left upright in wheelchair with seat alarm on and all needs within reach. Continue per current plan of care.      Function:    Cognition Comprehension Comprehension assist level: Understands complex 90% of the time/cues 10% of the time  Expression   Expression assist level: Expresses basic 90% of the time/requires cueing < 10% of the time.  Social Interaction Social  Interaction assist level: Interacts appropriately with others - No medications needed.  Problem Solving Problem solving assist level: Solves basic problems with no assist  Memory Memory assist level: Recognizes or recalls 75 - 89% of the time/requires cueing 10 - 24% of the time    Pain    Therapy/Group: Individual Therapy  Basem Yannuzzi 09/11/2017, 12:12 PM

## 2017-09-11 NOTE — Progress Notes (Signed)
Patient restless, "I just can't get comfortable!" Requested and assisted to [place]. After assisted back to bed, Reports having a "sneezing fit" , with complaint of stuffy nose. Requested SCD's, PRAFO and WHO removed. + anxiety. Declined any PRN meds. [name] A

## 2017-09-12 ENCOUNTER — Inpatient Hospital Stay (HOSPITAL_COMMUNITY): Payer: Self-pay | Admitting: Occupational Therapy

## 2017-09-12 ENCOUNTER — Inpatient Hospital Stay (HOSPITAL_COMMUNITY): Payer: Self-pay | Admitting: Speech Pathology

## 2017-09-12 ENCOUNTER — Inpatient Hospital Stay (HOSPITAL_COMMUNITY): Payer: Self-pay

## 2017-09-12 DIAGNOSIS — F329 Major depressive disorder, single episode, unspecified: Secondary | ICD-10-CM

## 2017-09-12 LAB — ALPHA GALACTOSIDASE: ALPHA GALACTOSIDASE, SERUM: 29.1 nmol/h/mg{prot} (ref 28.0–80.0)

## 2017-09-12 NOTE — Progress Notes (Signed)
Occupational Therapy Session Note  Patient Details  Name: [name] MRN: [id number] Date of Birth: August 26, 1961  Today's Date: 09/12/2017 OT Individual Time: 1019-1100 OT Individual Time Calculation (min): 41 min    Short Term Goals: Week 2:  OT Short Term Goal 1 (Week 2): Pt will complete toilet transfers with min assist stand pivot.  OT Short Term Goal 2 (Week 2): Pt will use the RUE as a stabilizer for selfcare tasks with mod assist and no more than min instructional cueing to initiate.   OT Short Term Goal 3 (Week 2): Pt will complete shower transfer with min assist stand pivot. OT Short Term Goal 4 (Week 2): Pt will completed LB dressing sit to stand with supervision.   Skilled Therapeutic Interventions/Progress Updates:    Pt completed toilet and tub/shower transfers during session.  She was able to pivot to both the tub bench and to the elevated toilet with min assist.  She was able to complete sit to stand with min assist as well for simulated toileting.  Pt was then able to maneuver her wheelchair around in the kitchen with supervision for simulated meal prep.  Discussed arranging items so it is easier for her to reach them if her husband is not there.  Also discussed using closeable containers for liquids so she can carry them in her wheelchair.  Educated pt on the need to use the counter top for transferring items from the microwave, or something that is not able to safely fit in her lap.  Returned to room at end of session with pt's spouse in.  Discussed attending ADL session tomorrow for education in preparation for discharge home.    Therapy Documentation Precautions:  Precautions Precautions: Fall Precaution Comments: right hemiparesis Restrictions Weight Bearing Restrictions: No   Pain: Pain Assessment Pain Assessment: No/denies pain Pain Score: 0-No pain Patients Stated Pain Goal: 2 ADL: See Function Navigator for Current Functional Status.   Therapy/Group:  Individual Therapy  Lennon Richins OTR/L 09/12/2017, 12:20 PM

## 2017-09-12 NOTE — Progress Notes (Signed)
Physical Therapy Session Note  Patient Details  Name: [name] MRN: [id number] Date of Birth: 1960/10/15  Today's Date: 09/12/2017 PT Individual Time: 1301-1413, 1515-1600  PT Individual Time Calculation (min): 72 min , 45 min   Short Term Goals: Week 2:  PT Short Term Goal 1 (Week 2): STG=LTG  Skilled Therapeutic Interventions/Progress Updates:     Session 1: Pt seated in w/c upon PT arrival, agreeable to therapy tx and denies pain. Pt propelled w/c with supervision to the gym. Pt ambulated 2 x 60 ft with quad cane and min assist, Chris from [employer] present for orthotics consult. Discussed orthotics benefits and options with the pt, pt hopeful that this will help with her gait and provide some extra support. Pt participated in BWSTT this session at speed of 0.6, 7 minutes total, 3 bouts. Therapist providing manual facilitation for R knee extension during stance and facilitation for hamstring activation during swing, verbal cues for increased step length with L LE. Pt participated in overground training without AD x50 requiring mod assist. Pt transported back to room in w/c and transferred to bed with supervision. Pt left supine in bed with needs in reach.   Session 2:  Pt seated in w/c upon PT arrival, agreeable to therapy tx and denies pain. Pt transported to gym in w/c. Pt transferred w/c<>mat squat pivot with supervision. Pt transferred siting<>supine with supervision. Pt in supine perform LE exercises for R neuro re-ed: x 10 bridges, x 10 single leg bridges with R LE, SAQ qith 1# ankle weight 2 x 10,supine hip abduction 2 x 10, hooklying hip abduction with manual resistance 2 x 10 and sitting hip abduction with TB 2 x 10, hamstring curls with TB x 10. Pt transferred back to w/c with supervision, left in w/c in room with needs in reach.    Therapy Documentation Precautions:  Precautions Precautions: Fall Precaution Comments: right hemiparesis Restrictions Weight Bearing  Restrictions: No   See Function Navigator for Current Functional Status.   Therapy/Group: Individual Therapy  Netta Corrigan, PT, DPT 09/12/2017, 7:51 AM

## 2017-09-12 NOTE — Progress Notes (Signed)
[place] PHYSICAL MEDICINE & REHABILITATION     PROGRESS NOTE  Subjective/Complaints:  Patient seen sitting up at the edge of the bed this morning. She states she slept well overnight. She has questions about discharge.  ROS: Denies nausea, vomiting, diarrhea, shortness of breath or chest pain   Objective: Vital Signs: Blood pressure 129/88, pulse 93, temperature 97.9 F (36.6 C), temperature source Oral, resp. rate 16, height 5\' 2"  (1.575 m), weight 58.9 kg (129 lb 13.6 oz), SpO2 95 %. No results found. No results for input(s): WBC, HGB, HCT, PLT in the last 72 hours. No results for input(s): NA, K, CL, GLUCOSE, BUN, CREATININE, CALCIUM in the last 72 hours.  Invalid input(s): CO CBG (last 3)  No results for input(s): GLUCAP in the last 72 hours.  Wt Readings from Last 3 Encounters:  08/28/17 58.9 kg (129 lb 13.6 oz)  08/21/17 60.8 kg (134 lb)    Physical Exam:  BP 129/88 (BP Location: Left Arm)   Pulse 93   Temp 97.9 F (36.6 C) (Oral)   Resp 16   Ht 5\' 2"  (1.575 m)   Wt 58.9 kg (129 lb 13.6 oz)   LMP  (LMP Unknown)   SpO2 95%   BMI 23.75 kg/m  Constitutional: She appears well-developed and well-nourished.  HENT: Atraumatic. Normocephalic. Eyes: EOMI. No discharge.  Cardiovascular: RRR. No JVD   Respiratory: Normal effort. Clear. GI: Bowel sounds are normal. Non-distended Musculoskeletal: She exhibits no edema or tenderness.  Neurological: She is alert.  Right facial droop  A&Ox3 with increased time Word finding difficulty Motor: Right upper extremity: 2-/5 shoulder abduction, distally 0/5  Right lower extremity: Hip flexion 4-/5, knee extension 3+/5, ADF/PF 2+/5 (stable) No increase in tone Skin: Rash resolved Psychiatric: Affect is pleasant and appropriate   Assessment/Plan: 1. 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 Physiatrist is providing close team supervision and 24 hour management of active medical problems listed below. Physiatrist and rehab team continue to assess barriers to discharge/monitor patient progress toward functional and medical goals.  Function:  Bathing Bathing position Bathing activity did not occur: N/A Position: Shower  Bathing parts Body parts bathed by patient: Right arm, Chest, Abdomen, Front perineal area, Right upper leg, Left upper leg, Left lower leg, Right lower leg, Buttocks, Left arm Body parts bathed by helper: Left arm  Bathing assist        Upper Body Dressing/Undressing Upper body dressing   What is the patient wearing?: Pull over shirt/dress     Pull over shirt/dress - Perfomed by patient: Thread/unthread left sleeve, Put head through opening, Pull shirt over trunk, Thread/unthread right sleeve Pull over shirt/dress - Perfomed by helper: Pull shirt over trunk, Thread/unthread right sleeve        Upper body assist Assist Level: Supervision or verbal cues      Lower Body Dressing/Undressing Lower body dressing   What is the patient wearing?: Pants, Shoes     Pants- Performed by patient: Thread/unthread left pants leg, Thread/unthread right pants leg, Pull pants up/down Pants- Performed by helper: Pull pants up/down, Thread/unthread right pants leg Non-skid slipper socks- Performed by patient: Don/doff right sock Non-skid slipper socks- Performed by helper: Don/doff left sock     Shoes - Performed by patient: Don/doff right shoe, Don/doff left shoe Shoes - Performed by helper: Don/doff right shoe  Lower body assist Assist for lower body dressing: (2/5, 40%, max assist)      Toileting Toileting   Toileting steps completed by patient: Adjust clothing prior to toileting Toileting steps completed by helper: Adjust clothing prior to toileting, Performs perineal hygiene, Adjust clothing after toileting Toileting Assistive Devices:  Grab bar or rail  Toileting assist Assist level: Touching or steadying assistance (Pt.75%)   Transfers Chair/bed transfer   Chair/bed transfer method: Stand pivot Chair/bed transfer assist level: Supervision or verbal cues Chair/bed transfer assistive device: Armrests, Bedrails     Locomotion Ambulation     Max distance: 60 ft Assist level: Touching or steadying assistance (Pt > 75%)   Wheelchair   Type: Manual Max wheelchair distance: 150' Assist Level: Supervision or verbal cues  Cognition Comprehension Comprehension assist level: Understands complex 90% of the time/cues 10% of the time  Expression Expression assist level: Expresses basic 90% of the time/requires cueing < 10% of the time.  Social Interaction Social Interaction assist level: Interacts appropriately with others - No medications needed.  Problem Solving Problem solving assist level: Solves basic problems with no assist  Memory Memory assist level: Recognizes or recalls 75 - 89% of the time/requires cueing 10 - 24% of the time    Medical Problem List and Plan: 1.  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 Status post loop recorder   Cont CIR    PRAFO/WHO     Fluoxetine started 12/12.   2.  DVT Prophylaxis/Anticoagulation: SCDs. Bilateral lower extremity Dopplers negative 3. Pain Management: Tylenol as needed 4. Mood: Provide emotional support 5. Neuropsych: This patient is not fully capable of making decisions on her own behalf. 6. Skin/Wound Care: Routine skin checks 7. Fluids/Electrolytes/Nutrition: Routine I&O's   BMP within acceptable range on 12/14   Labs ordered for tomorrow  8. Tobacco abuse. Counseling/NicoDerm patch 9. Dysphagia. Follow-up speech therapy.   Advanced to regular diet with supervision 10. Hyperlipidemia. Lipitor 11. Hypertension.    Lopressor 12.5 d/ced due to allergy.    Overall controlled on 12/18 Vitals:    09/11/17 1519 09/12/17 0326  BP: 140/77 129/88  Pulse: 92 93  Resp: 18 16  Temp: 98.2 F (36.8 C) 97.9 F (36.6 C)  SpO2: 93% 95%   12. Transaminitis: Resolved   Labs WNL on 12/7   Cont to monitor 13. Drug allergy   To metoprolol, but was also on ASA.   Metoprolol d/ced   ASA restarted on 12/10, monitor closesly   Change Benadryl and pepcid to po   Weaning steroids on 12/18 again 14. Hypoalbuminemia   Supplement initiated on 12/7 15. Leukocytosis   WBCs 12.6 on 12/14   Labs ordered for tomorrow    Afebrile, steroid effect    Cont to monitor  LOS (Days) 15 A FACE TO FACE EVALUATION WAS PERFORMED  [name] Lorie Phenix 09/12/2017 8:22 AM

## 2017-09-12 NOTE — Progress Notes (Signed)
Orthopedic Tech Progress Note Patient Details:  [name] 1960-11-01 [id number]  Patient ID: [name], female   DOB: Aug 29, 1961, 56 y.o.   MRN: [id number]   [name] 09/12/2017, 1:49 PM Called in [employer] brace order; spoke with [facility]

## 2017-09-12 NOTE — Progress Notes (Signed)
Speech Language Pathology Daily Session Note  Patient Details  Name: [name] MRN: [id number] Date of Birth: 28-Jul-1961  Today's Date: 09/12/2017 SLP Individual Time: 0175-1025 SLP Individual Time Calculation (min): 25 min  Short Term Goals: Week 2: SLP Short Term Goal 1 (Week 2): Pt will use compensatory strategies to facilitate moderately complex word finding at the conversational level with supervision verbal cues.  SLP Short Term Goal 2 (Week 2): Pt will identify at least 2 potential barriers to discharge and generate solutions with supervision question cues.   SLP Short Term Goal 3 (Week 2): Pt will complete semi-complex tasks with supervision cues for functional problem solving  SLP Short Term Goal 4 (Week 2): Pt will return demonstration of at least 2 safety precautions with supervision.   SLP Short Term Goal 5 (Week 2): Pt will consume regular textures and thin liquids with mod I use of swallowing precautions over 2 targeted sessions  Skilled Therapeutic Interventions:  Pt was seen for dysphagia and communication goals.  Pt consumed a functional snack of regular textures and thin liquids via straw with mod I use of swallowing precautions and no overt s/s of aspiration.  Pt was also able to complete a semi-complex generative naming task for ~80% accuracy independently which improved to 100% accuracy with supervision verbal cues.  Pt left in wheelchair with call bell within reach.  Continue per current plan of care.    Function:  Eating Eating   Modified Consistency Diet: No Eating Assist Level: Set up assist for;More than reasonable amount of time   Eating Set Up Assist For: Opening containers       Cognition Comprehension Comprehension assist level: Understands complex 90% of the time/cues 10% of the time  Expression   Expression assist level: Expresses basic 90% of the time/requires cueing < 10% of the time.  Social Interaction Social Interaction assist level:  Interacts appropriately with others with medication or extra time (anti-anxiety, antidepressant).  Problem Solving Problem solving assist level: Solves basic 90% of the time/requires cueing < 10% of the time  Memory Memory assist level: Recognizes or recalls 75 - 89% of the time/requires cueing 10 - 24% of the time    Pain Pain Assessment Pain Assessment: No/denies pain Pain Score: 0-No pain Patients Stated Pain Goal: 2  Therapy/Group: Individual Therapy  Gwendolyn Nishi, Selinda Orion 09/12/2017, 12:18 PM

## 2017-09-12 NOTE — Discharge Instructions (Signed)
Inpatient Rehab Discharge Instructions  [facility] Discharge date and time: No discharge date for patient encounter.   Activities/Precautions/ Functional Status: Activity: activity as tolerated Diet: regular diet Wound Care: none needed Functional status:  ___ No restrictions     ___ Walk up steps independently ___ 24/7 supervision/assistance   ___ Walk up steps with assistance ___ Intermittent supervision/assistance  ___ Bathe/dress independently ___ Walk with walker     _x__ Bathe/dress with assistance ___ Walk Independently    ___ Shower independently ___ Walk with assistance    ___ Shower with assistance ___ No alcohol     ___ Return to work/school ________  Special Instructions: No driving or smoking  COMMUNITY REFERRALS UPON DISCHARGE:    Home Health:   PT, OT, [place]   Date of last service:09/14/2017  Medical Equipment/Items Ordered:SBQC, WHEELCHAIR, 3IN1 & TUB BENCH  Agency/Supplier:[facility]  [phone] [facility] PROGRAM FOR MEDICATION ASSISTNACE  GENERAL COMMUNITY RESOURCES FOR PATIENT/FAMILY: Support Groups:[facility] SUPPORT GROUP EVERY SECOND Thursday @ 3:00-4:00 PM ON THE REHAB UNIT QUESTIONS CONTACT [place] [phone]  STROKE/TIA DISCHARGE INSTRUCTIONS SMOKING Cigarette smoking nearly doubles your risk of having a stroke & is the single most alterable risk factor  If you smoke or have smoked in the last 12 months, you are advised to quit smoking for your health.  Most of the excess cardiovascular risk related to smoking disappears within a year of stopping.  Ask you doctor about anti-smoking medications  [place] Quit Line: 1-800-QUIT NOW  Free Smoking Cessation Classes [phone]  CHOLESTEROL Know your levels; limit fat & cholesterol in your diet  Lipid Panel     Component Value Date/Time   CHOL 237 (H) 08/22/2017 0811   TRIG 295 (H) 08/22/2017 0811   TRIG 301 (H) 08/22/2017 0811   HDL 35 (L)  08/22/2017 0811   CHOLHDL 6.8 08/22/2017 0811   VLDL 59 (H) 08/22/2017 0811   LDLCALC 143 (H) 08/22/2017 9371      Many patients benefit from treatment even if their cholesterol is at goal.  Goal: Total Cholesterol (CHOL) less than 160  Goal:  Triglycerides (TRIG) less than 150  Goal:  HDL greater than 40  Goal:  LDL (LDLCALC) less than 100   BLOOD PRESSURE American Stroke Association blood pressure target is less that 120/80 mm/Hg  Your discharge blood pressure is:  BP: 138/80  Monitor your blood pressure  Limit your salt and alcohol intake  Many individuals will require more than one medication for high blood pressure  DIABETES (A1c is a blood sugar average for last 3 months) Goal HGBA1c is under 7% (HBGA1c is blood sugar average for last 3 months)  Diabetes: No known diagnosis of diabetes    Lab Results  Component Value Date   HGBA1C 5.5 08/22/2017     Your HGBA1c can be lowered with medications, healthy diet, and exercise.  Check your blood sugar as directed by your physician  Call your physician if you experience unexplained or low blood sugars.  PHYSICAL ACTIVITY/REHABILITATION Goal is 30 minutes at least 4 days per week  Activity: Increase activity slowly, Therapies: Physical Therapy: Home Health Return to work:   Activity decreases your risk of heart attack and stroke and makes your heart stronger.  It helps control your weight and blood pressure; helps you relax and can improve your mood.  Participate in a regular exercise program.  Talk with your doctor about the best form of exercise for you (dancing,  walking, swimming, cycling).  DIET/WEIGHT Goal is to maintain a healthy weight  Your discharge diet is: Diet regular Room service appropriate? Yes; Fluid consistency: Thin  liquids Your height is:  Height: 5\' 2"  (157.5 cm) Your current weight is: Weight: 58.9 kg (129 lb 13.6 oz) Your Body Mass Index (BMI) is:  BMI (Calculated): 23.74  Following the type of  diet specifically designed for you will help prevent another stroke.  Your goal weight range is:    Your goal Body Mass Index (BMI) is 19-24.  Healthy food habits can help reduce 3 risk factors for stroke:  High cholesterol, hypertension, and excess weight.  RESOURCES Stroke/Support Group:  Call [phone]   STROKE EDUCATION PROVIDED/REVIEWED AND GIVEN TO PATIENT Stroke warning signs and symptoms How to activate emergency medical system (call 911). Medications prescribed at discharge. Need for follow-up after discharge. Personal risk factors for stroke. Pneumonia vaccine given:  Flu vaccine given:  My questions have been answered, the writing is legible, and I understand these instructions.  I will adhere to these goals & educational materials that have been provided to me after my discharge from the hospital.      My questions have been answered and I understand these instructions. I will adhere to these goals and the provided educational materials after my discharge from the hospital.  Patient/Caregiver Signature _______________________________ Date __________  Clinician Signature _______________________________________ Date __________  Please bring this form and your medication list with you to all your follow-up doctor's appointments.

## 2017-09-13 ENCOUNTER — Inpatient Hospital Stay (HOSPITAL_COMMUNITY): Payer: Self-pay | Admitting: Occupational Therapy

## 2017-09-13 ENCOUNTER — Inpatient Hospital Stay (HOSPITAL_COMMUNITY): Payer: Self-pay

## 2017-09-13 ENCOUNTER — Inpatient Hospital Stay (HOSPITAL_COMMUNITY): Payer: Self-pay | Admitting: Speech Pathology

## 2017-09-13 ENCOUNTER — Encounter (HOSPITAL_COMMUNITY): Payer: Self-pay | Admitting: Psychology

## 2017-09-13 DIAGNOSIS — E876 Hypokalemia: Secondary | ICD-10-CM

## 2017-09-13 LAB — CBC WITH DIFFERENTIAL/PLATELET
BASOS ABS: 0 10*3/uL (ref 0.0–0.1)
Basophils Relative: 0 %
EOS ABS: 0.3 10*3/uL (ref 0.0–0.7)
EOS PCT: 3 %
HCT: 42.3 % (ref 36.0–46.0)
Hemoglobin: 13.8 g/dL (ref 12.0–15.0)
LYMPHS PCT: 14 %
Lymphs Abs: 1.5 10*3/uL (ref 0.7–4.0)
MCH: 31.1 pg (ref 26.0–34.0)
MCHC: 32.6 g/dL (ref 30.0–36.0)
MCV: 95.3 fL (ref 78.0–100.0)
Monocytes Absolute: 1.1 10*3/uL — ABNORMAL HIGH (ref 0.1–1.0)
Monocytes Relative: 11 %
NEUTROS PCT: 72 %
Neutro Abs: 7.7 10*3/uL (ref 1.7–7.7)
PLATELETS: 377 10*3/uL (ref 150–400)
RBC: 4.44 MIL/uL (ref 3.87–5.11)
RDW: 13.8 % (ref 11.5–15.5)
WBC: 10.6 10*3/uL — AB (ref 4.0–10.5)

## 2017-09-13 LAB — BASIC METABOLIC PANEL
Anion gap: 9 (ref 5–15)
BUN: 15 mg/dL (ref 6–20)
CO2: 27 mmol/L (ref 22–32)
Calcium: 8.8 mg/dL — ABNORMAL LOW (ref 8.9–10.3)
Chloride: 100 mmol/L — ABNORMAL LOW (ref 101–111)
Creatinine, Ser: 0.54 mg/dL (ref 0.44–1.00)
Glucose, Bld: 82 mg/dL (ref 65–99)
POTASSIUM: 3.3 mmol/L — AB (ref 3.5–5.1)
SODIUM: 136 mmol/L (ref 135–145)

## 2017-09-13 MED ORDER — PREDNISONE 10 MG PO TABS
5.0000 mg | ORAL_TABLET | Freq: Every day | ORAL | Status: DC
  Administered 2017-09-13 – 2017-09-14 (×2): 5 mg via ORAL
  Filled 2017-09-13: qty 1

## 2017-09-13 MED ORDER — FLUOXETINE HCL 20 MG PO CAPS
20.0000 mg | ORAL_CAPSULE | Freq: Every day | ORAL | Status: DC
  Administered 2017-09-13 – 2017-09-14 (×2): 20 mg via ORAL
  Filled 2017-09-13 (×2): qty 1

## 2017-09-13 MED ORDER — POTASSIUM CHLORIDE CRYS ER 20 MEQ PO TBCR
30.0000 meq | EXTENDED_RELEASE_TABLET | Freq: Two times a day (BID) | ORAL | Status: AC
  Administered 2017-09-13 (×2): 30 meq via ORAL
  Filled 2017-09-13 (×2): qty 1

## 2017-09-13 NOTE — Consult Note (Signed)
Neuropsychological Consultation   Patient:   [name]   DOB:   03-May-1961  MR Number:  [id number]  Location:  [place] [address] [facility] [id number] [place] [address] Dept: [place]: [phone]           Date of Service:   09/13/2017  Start Time:   8 AM End Time:   9 AM  Provider/Observer:  Ilean Skill, Psy.D.       Clinical Neuropsychologist       Billing Code/Service: [phone] 4 Units  Chief Complaint:    [name] is a 56 year old female with generally good health history other than tobacco abuse.  Presented 08/21/2017 with right sided weakness and changes in speech.  CT showed left CVA.  TPA administered due to left ICA and M1 thrombolytic stroke.  Patient has shown return of expressive language and some mild improvement in right leg functioning but little return of right arm motor functioning.  Patent has had reactive depression symptoms with sudden emotional response (crying) when talking about loss of function as well as other family situations she is concerned she will not be able to help with including medical issues with mother in law and aging mother.    Reason for Service:  [name] was referred for a neuropsychological evaluation due to coping issues and reactive depressive symptoms.  The patient has also had more issues with worry and anxiety as discharge is approaching.  Below is the HPI for the current admission.   HPI: [name] a 41 y.o.right handed femalewith unremarkable past medical history except tobacco abuse on no prescription medications. Per chart review and family, patient lives with spouse. Independent prior to admission. Presented 08/21/2017 to [facility] hospital with right-sided weakness and slurred speech. CT of the head reviewed, showing left CVA.  Per report, small acute infarct left insula, left putamen and possibly left parietal  cortex. Hyperdense distal left ICA and M1 segment consistent with thrombus. CT angiogram head and neck showed positive emergent large vessel occlusion. Severe atheromatous stenosis of the left ICA bulb felt to be embolic source. Patient did receive TPA. Patient transferred to [facility] underwent revascularization per interventional radiology. Latest MRI shows acute infarct left temporal operculum, left posterior insula, left posterior lentiform nuclear medicine extending into the corona radiata and caudate body. Biparietal white matter microhemorrhage felt to be chronic. Echocardiogram with ejection fraction of 60% no wall motion abnormalities. Lower extremity Dopplers negative for DVT. Neurology follow-up with TEE that showed LAA without masses no PFO and loop recorder placed 08/28/2017. Presently on aspirin for CVA prophylaxis. Dysphagia #3 thin liquid diet. Physical and occupational therapy evaluations completed 08/24/2017 with recommendations of physical medicine rehabilitation consult. Patient was admitted for a comprehensive rehabilitation program  Current Status:  The patient is having difficulty coping with loss of motor function for right side (hemiparisis) .  She is having sudden crying spells (that are reducing in frequency and intensity) that were not normal for her prior.  May have neurologic component but during session today each response was directly related to current discussion topic.  This pattern of connection to content is continuing.  The patient is reporting reducing depressive responses but is having more anxiety associated return to home after discharge.   Behavioral Observation: [name]  presents as a 30 y.o.-year-old Right Caucasian Female who appeared her stated age. her dress was Appropriate  and she was Well Groomed and her manners were Appropriate to the situation.  her participation was indicative of Appropriate and Attentive behaviors.  There were physical  disabilities noted.  she displayed an appropriate level of cooperation and motivation.     Interactions:    Active Appropriate and Attentive  Attention:   within normal limits and attention span and concentration were age appropriate  Memory:   abnormal; Patient is having some issues with memory for situation details, but auditory language is good.  Episodic memory issues (mild).    Visuo-spatial:  not examined  Speech (Volume):  normal  Speech:   normal; normal  Thought Process:  Coherent and Relevant  Though Content:  WNL; not suicidal  Orientation:   person, place, time/date and situation  Judgment:   Good  Planning:   Good  Affect:    Anxious and Depressed  Mood:    Anxious and Depressed  Insight:   Good  Intelligence:   normal  Marital Status/Living: Patient is married with supportive husband and adult children, [place] and husband.  Medical History:  No past medical history on file.          Psychiatric History:  No prior psychiatric history.  Family Med/Psych History:  Family History  Problem Relation Age of Onset  . Heart attack Father     Risk of Suicide/Violence: virtually non-existent   Impression/DX:  [name] is a 56 year old female with generally good health history other than tobacco abuse.  Presented 08/21/2017 with right sided weakness and changes in speech.  CT showed left CVA.  TPA administered due to left ICA and M1 thrombolytic stroke.  Patient has shown return of expressive language and some mild improvement in right leg functioning but little return of right arm motor functioning.  Patent has had reactive depression symptoms with sudden emotional response (crying) when talking about loss of function as well as other family situations she is concerned she will not be able to help with including medical issues with mother in law and aging mother.    The patient is having difficulty coping with loss of  motor function for right side (hemiparisis) .  She is having sudden crying spells (that are reducing in frequency and intensity) that were not normal for her prior.  May have neurologic component but during session today each response was directly related to current discussion topic.  This pattern of connection to content is continuing.  The patient is reporting reducing depressive responses but is having more anxiety associated return to home after discharge.   Disposition/Plan:  Patient working on setting up outpatient follow-up with PT/OT.  Diagnosis:    Left middle cerebral artery stroke [facility]) - Plan: Ambulatory referral to Physical Medicine Rehab         Electronically Signed   _______________________ Ilean Skill, Psy.D.

## 2017-09-13 NOTE — Patient Care Conference (Signed)
Inpatient RehabilitationTeam Conference and Plan of Care Update Date: 09/13/2017   Time: 10:00 AM    Patient Name: [name]      Medical Record Number: [id number]  Date of Birth: 03-14-1961 Sex: Female         Room/Bed: 4M02C/4M02C-01 Payor Info: Payor: MEDICAID POTENTIAL / Plan: MEDICAID POTENTIAL / Product Type: *No Product type* /    Admitting Diagnosis: L CVA  Admit Date/Time:  08/28/2017  5:51 PM Admission Comments: No comment available   Primary Diagnosis:  <principal problem not specified> Principal Problem: <principal problem not specified>  Patient Active Problem List   Diagnosis Date Noted  . Hypokalemia   . Reactive depression   . Right hemiparesis (HCC)   . Drug allergy   . Hypoalbuminemia due to protein-calorie malnutrition (HCC)   . Leucocytosis   . Essential hypertension   . Angio-edema   . Dysphagia   . Allergic contact dermatitis due to adhesives   . Transaminitis   . 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 Cerebrovascular accident (CVA) due to occlusion of left middle cerebral artery (HCC)   . Right hemiplegia (HCC)   . Dysphagia, post-stroke   . Tobacco abuse   . Hyperlipidemia   . Benign essential HTN   . Endotracheally intubated   . Acute respiratory insufficiency   . Acute embolic stroke (HCC) 08/21/2017    Expected Discharge Date: Expected Discharge Date: 09/14/17  Team Members Present: Physician leading conference: Dr. Maryla Morrow Social Worker Present: Dossie Der, LCSW Nurse Present: Allayne Stack, RN OT Present: Perrin Maltese, OT SLP Present: Colin Benton, SLP PPS Coordinator present : Tora Duck, RN, CRRN     Current Status/Progress Goal Weekly Team Focus  Medical   Right hemiplegia, dysphagia, hypophonia secondary to acute left MCA infarct   Improve mobility, transfers, hypokalemia  See above   Bowel/Bladder   continent of bowel and bladder LBM 09-11-17  remain continent of bowel and bladder maintain  regular bowel pattern   Assist  with toileting needs prn    Swallow/Nutrition/ Hydration   mod I, dysphagia goals met          ADL's   supervision for bathing UB with min assist for bathing and dressing LB.  Min assist for stand pivot transfers to the toilet and tub shower.  Still with Brunnstrum stage II in the left arm and hand.    supervision overall  selfcare retraining, balance retraining, transfer retraining, pt/family education, therapeutic exercise.     Mobility   supervision squat pivot transfer, min assist gait with quad cane 60 ft, supervision bed mobility and w/c  Min A gait, supervision transfers  d/c planning, gait, stairs, awareness   Communication   supervision overall but can be mod I for basic   mod I   continue to address education and carryover of compensatory strategies    Safety/Cognition/ Behavioral Observations  supervision   mod I-supervision   completion of family education with pt's husband    Pain   no c/o pain   0  Asssess pain q shift and prn    Skin   rash to back resolving   no new skin issues  Assess skin q shift and prn      *See Care Plan and progress notes for long and short-term goals.     Barriers to Discharge  Current Status/Progress Possible Resolutions Date Resolved   Physician    Medical stability     See above  Therapies, drug rash resolved, weaning steroids, supplement K+      Nursing                  PT                    OT                  SLP                SW                Discharge Planning/Teaching Needs:  Husband has been here and learning pt's care in preparation for discharge tomorrow.      Team Discussion:  Meeting goals of supervision to min assist level. MD adjusting meds and feels allergies resolved. MD feels stable for discharge tomorrow. Husband and son here for family education completion.   Revisions to Treatment Plan:  DC 12/20    Continued Need for Acute Rehabilitation Level of Care: The patient  requires daily medical management by a physician with specialized training in physical medicine and rehabilitation for the following conditions: Daily direction of a multidisciplinary physical rehabilitation program to ensure safe treatment while eliciting the highest outcome that is of practical value to the patient.: Yes Daily medical management of patient stability for increased activity during participation in an intensive rehabilitation regime.: Yes Daily analysis of laboratory values and/or radiology reports with any subsequent need for medication adjustment of medical intervention for : Neurological problems;Other  Tennyson Kallen, Lemar Livings 09/13/2017, 1:22 PM

## 2017-09-13 NOTE — Progress Notes (Signed)
Occupational Therapy Discharge Summary  Patient Details  Name: [name] MRN: [id number] Date of Birth: August 09, 1961  Today's Date: 09/13/2017 OT Individual Time: 6644-0347 OT Individual Time Calculation (min): 61 min    Session Note:  Pt completed bathing and dressing with spouse present and assisting during family education.  She was able to transfer in and out of the walk-in shower with min assist stand pivot.  Min assist needed for bathing sit to stand with supervision for all dressing following hemi techniques.  Therapist provided shoe buttons and educated pt and spouse on use as well as wash mitten.  Next pt completed grooming tasks from the wheelchair at modified independent level.  She then completed tub/shower transfers to the tub shower in the ADL apartment with min assist and spouse assisting.  Finished session with education on AAROM exercises for the RUE with handout provided.  Pt's spouse able to return demonstrate appropriate performance of them.   Patient has met 11 of 14 long term goals due to improved balance, postural control, ability to compensate for deficits, functional use of  RIGHT upper and RIGHT lower extremity and improved awareness.  Patient to discharge at [facility] Assist level.  Patient's care partner is independent to provide the necessary physical and cognitive assistance at discharge.    Reasons goals not met: Pt needs min assist for toilet and shower transfers.  She also needs max assist for RUE use as a gross assist. Recommendation:  Patient will benefit from ongoing skilled OT services in home health setting to continue to advance functional skills in the area of BADL, iADL and Reduce care partner burden.  Pt still needs min assist for completion of transfers stand pivot secondary to decreased dynamic balance, as well as decreased RLE strength.  She continues to also demonstrate limited AROM and functional use of the RUE.  Feel continued OT is necessary to  address these deficits in order to reach modified independent level.    Equipment: tub bench, 3:1  Reasons for discharge: treatment goals met and discharge from hospital  Patient/family agrees with progress made and goals achieved: Yes  OT Discharge Precautions/Restrictions  falls  Pain  No report of pain  ADL  See Function section in chart for details  Vision Baseline Vision/History: Wears glasses Wears Glasses: Reading only Patient Visual Report: Blurring of vision Eye Alignment: Impaired (comment) Ocular Range of Motion: Within Functional Limits Alignment/Gaze Preference: Within Defined Limits Tracking/Visual Pursuits: (still with decreased superior tracking to the right) Visual Fields: No apparent deficits Perception  Perception: Impaired Comments: She still needs cueing for positioning of the RUE in her lap after and before transitional movements. Praxis Praxis: Intact Cognition Overall Cognitive Status: Impaired/Different from baseline Arousal/Alertness: Awake/alert Orientation Level: Oriented X4 Attention: Selective Sustained Attention: Appears intact Selective Attention: Impaired Selective Attention Impairment: Functional complex Memory: Impaired Memory Impairment: Decreased recall of new information Awareness: Impaired Awareness Impairment: Anticipatory impairment Problem Solving: Impaired Problem Solving Impairment: Functional complex Executive Function: Reasoning;Self Monitoring;Self Correcting Reasoning: Impaired Reasoning Impairment: Functional complex Self Monitoring: Impaired Self Monitoring Impairment: Verbal complex Self Correcting: Impaired Self Correcting Impairment: Verbal complex Behaviors: Impulsive Safety/Judgment: Impaired Comments: She continues to need cueing for right foot and hand positioning as well as for locking the brakes prior to transfers.   Sensation Sensation Light Touch: Appears Intact Stereognosis: Not tested Hot/Cold:  Not tested Proprioception: Appears Intact Additional Comments: RUE sensation intact Coordination Gross Motor Movements are Fluid and Coordinated: No Fine Motor Movements are Fluid  and Coordinated: No Coordination and Movement Description: RUE is currently at a Brunnstrum stage II movement in the shoulder and elbow.  Stage I is noted at the hand and digits.  max hand over hand to use as a stabilizer.   Motor  Motor Motor: Hemiplegia Motor - Skilled Clinical Observations: R hemi Mobility  Transfers Transfers: Sit to Stand Sit to Stand: With upper extremity assist;From bed;5: Supervision Stand to Sit: 5: Supervision;With upper extremity assist;To chair/3-in-1;With armrests  Trunk/Postural Assessment  Cervical Assessment Cervical Assessment: Within Functional Limits Thoracic Assessment Thoracic Assessment: Within Functional Limits Lumbar Assessment Lumbar Assessment: Within Functional Limits Postural Control Postural Control: (She maintains posterior pelvic tilt in sitting)  Balance Balance Balance Assessed: Yes Static Sitting Balance Static Sitting - Balance Support: No upper extremity supported Static Sitting - Level of Assistance: 5: Stand by assistance Dynamic Sitting Balance Dynamic Sitting - Balance Support: During functional activity Dynamic Sitting - Level of Assistance: 5: Stand by assistance Static Standing Balance Static Standing - Balance Support: Left upper extremity supported;During functional activity Static Standing - Level of Assistance: 5: Stand by assistance Dynamic Standing Balance Dynamic Standing - Balance Support: During functional activity Dynamic Standing - Level of Assistance: 4: Min assist Extremity/Trunk Assessment RUE Assessment RUE Assessment: Exceptions to [facility] WFLs for all joints.  Pt currently exhibits Brunnstrum stage II movement in the arm and stage I in the hand.  She needs max hand over hand to initiate and complete use during all  selfcare tasks. ) LUE Assessment LUE Assessment: Within Functional Limits   See Function Navigator for Current Functional Status.  Winefred Hillesheim OTR/L 09/13/2017, 5:36 PM

## 2017-09-13 NOTE — Progress Notes (Signed)
Speech Language Pathology Discharge Summary  Patient Details  Name: [name] MRN: [id number] Date of Birth: November 16, 1960  Today's Date: 09/13/2017 SLP Individual Time: 1400-1430 SLP Individual Time Calculation (min): 30 min   Skilled Therapeutic Interventions: Skilled ST services focused on cognitive skills and provided education. SLP reviewed education on word finding strategies, recall strategies, safety awareness and swallow strategies with pt and husband. Pt demonstrated ability to recall swallow strategies at Mod I level, recall strategies utilizing visual aid and recall of today's events with supervision A verbal cues. Pt demonstrated recall of safety precautions with supervision A verbal cues for step by step details and identified two barriers upon returning home with supervision A question cues. SLP educated pt and husband about the continued progression of the recovery process. SLP answered all questions to pt and husband's satisfaction.Pt was left with husband in room.      Patient has met 5 of 7 long term goals.  Patient to discharge at overall Supervision level.  Reasons goals not met: supervision needed for semi-complex word finding and problem solving    Clinical Impression/Discharge Summary:  Pt has made functional gains while inpatient and is discharging having met 5 out of 7 long term goals.  Pt's diet has been advanced to regular textures and thin liquids which she is consuming with mod I use of swallowing precautions.  Pt no longer needs to consistently use chin tuck as evidenced by no overt s/s of aspiration when consuming thin liquids in head neutral posture; however, pt may continue to periodically use chin tuck out of habit.  Pt is currently overall supervision for cognitive-linguistic function due to mild higher level word finding impairment and cognitive deficits characterized by decreased anticipatory awareness and decreased functional problem solving.  Pt and family  education is complete at this time.  Pt is discharging home with 24/7 supervision and recommendations for ST follow up at next level of care.    Care Partner:  Caregiver Able to Provide Assistance: Yes  Type of Caregiver Assistance: Physical;Cognitive  Recommendation:  24 hour supervision/assistance;Home Health SLP;Outpatient SLP  Rationale for SLP Follow Up: Maximize functional communication;Maximize cognitive function and independence;Reduce caregiver burden   Equipment: none recommended by SLP    Reasons for discharge: Discharged from hospital   Patient/Family Agrees with Progress Made and Goals Achieved: Yes   Function:  Eating Eating                 Cognition Comprehension Comprehension assist level: Understands complex 90% of the time/cues 10% of the time  Expression   Expression assist level: Expresses complex 90% of the time/cues < 10% of the time  Social Interaction Social Interaction assist level: Interacts appropriately with others with medication or extra time (anti-anxiety, antidepressant).  Problem Solving Problem solving assist level: Solves basic problems with no assist  Memory Memory assist level: Recognizes or recalls 75 - 89% of the time/requires cueing 10 - 24% of the time   Emilio Math 09/13/2017, 4:14 PM

## 2017-09-13 NOTE — Progress Notes (Signed)
Social Work Patient ID: [name], female   DOB: [birth year], 56 y.o.   MRN: [id number]  Met with pt and husband who is here to learn pt's care and son-larry also here. Aware of the team conference and goals she has achieved while here. Discussed equipment and follow up. Pt glad to be going home and feels ready, husband is very supportive  of pt and ready for discharge.

## 2017-09-13 NOTE — Discharge Summary (Signed)
[name], [name]              ACCOUNT NO.:  [id number]  MEDICAL RECORD NO.:  [id number]  LOCATION:  4M02C                        FACILITY:  [facility]  PHYSICIAN:  Delice Lesch, MD        DATE OF BIRTH:  Feb 15, 1961  DATE OF ADMISSION:  08/28/2017 DATE OF DISCHARGE:  09/14/2017                              DISCHARGE SUMMARY   DISCHARGE DIAGNOSES: 1. Left middle cerebral artery infarction, status post tPA with     thrombectomy and loop recorder. 2. Sequential compression devices for deep vein thrombosis     prophylaxis. 3. Pain management. 4. Tobacco abuse. 5. Dysphagia. 6. Hyperlipidemia. 7. Hypertension. 8. Drug allergy RASH.  This is a 56 year old right-handed female with unremarkable past history except tobacco abuse, on no prescription medication, who lives with her spouse, independent prior to admission.  Presented on August 21, 2017, to the [facility] with right-sided weakness and slurred speech. CT of the head showed left CVA, small acute infarct of left insula, left putamen, and possibly left parietal cortex.  Hyperdense of distal left ICA and M1 segment, consistent with thrombus.  CT angiogram of head and neck showed positive emergent large vessel occlusion.  Severe atheromatous stenosis of the left ICA bulb, felt to be embolic source. The patient did receive tPA.  Transferred to [facility], underwent revascularization per Interventional Radiology.  Latest MRI showed acute infarction of left temporal operculum, left posterior insula, left posterior lentiform nucleus.  Echocardiogram with ejection fraction 60%.  No wall motion abnormalities.  Lower extremity Dopplers negative.  TEE showed LAA without mass, no PFO.  Loop recorder was placed.  Maintained on aspirin for CVA prophylaxis.  Mechanical soft diet.  Physical and occupational therapy ongoing.  The patient was admitted for comprehensive rehab program.  PAST MEDICAL HISTORY:  See discharge  diagnoses.  SOCIAL HISTORY:  Lives with spouse, independent prior to admission.  FUNCTIONAL STATUS UPON ADMISSION TO REHAB SERVICES:  Max assist stand pivot transfers, +2 physical assist sit to stand, mod to max assist activities of daily living.  PHYSICAL EXAMINATION:  VITAL SIGNS:  Blood pressure 151/71, pulse 105, temperature 98, and respirations 22. GENERAL:  Alert female, in no acute distress.  Right facial droop.  She had some word-finding difficulties. HEENT:  EOMs intact. NECK:  Supple, nontender.  No JVD. CARDIAC:  Rate controlled. ABDOMEN:  Soft, nontender.  Good bowel sounds. LUNGS:  Clear to auscultation without wheeze. EXTREMITIES:  Right and left upper extremity 0/5, proximal to distal.  REHABILITATION HOSPITAL COURSE:  The patient was admitted to Inpatient Rehab Services with therapies initiated on a 3-hour daily basis consisting of physical therapy, occupational therapy, speech therapy, and rehabilitation nursing.  The following issues were addressed during the patient's rehabilitation stay.  Pertaining to Ms. Siebert's left MCA infarction, remained stable.  She had undergone thrombectomy and loop recorder placement.  Maintained on aspirin therapy.  She would follow up with Neurology Services.  SCDs for DVT prophylaxis.  Venous Doppler studies negative.  Blood pressures controlled.  Noted history of tobacco abuse.  She received full counseling in regard to cessation of nicotine products.  Her diet was advanced to a regular consistency.  Noted the patient did develop a diffuse rash, felt to be a contact dermatitis.  On review of medications and assistance by pharmacy, felt to be reaction due to her metoprolol which was discontinued.  Rash did subside.  She did receive a prednisone taper.  Lopressor was placed on her allergy list.  She remained afebrile.  The patient received weekly collaborative interdisciplinary team conferences to discuss estimated length of  stay, family teaching, any barriers to discharge.  She was ambulating 60 feet with a quad cane, minimal assistance.  She was fitted with an AFO brace. Transferred wheelchair to mat squat pivot with supervision.  Transfers sitting to supine with supervision, activities of daily living, and homemaking.  She was able to pivot to both the tub bench and to the elevated toilet with minimal assistance.  She was able to complete sit to stand with minimal assist for simulated toileting.  She could communicate her needs.  Her diet was advanced to regular.  Full family teaching was completed and plan discharge to home.  DISCHARGE MEDICATIONS: 1. Aspirin 325 mg p.o. daily. 2. Pepcid 20 mg p.o. b.i.d. 3. Prozac 10 mg p.o. daily. 4. Tylenol as needed. 5. Prednisone taper off 12/20 6. NicoDerm patch 14mg  as needed  DIET:  Her diet was regular.  She would follow up with Dr. Delice Lesch at the Outpatient Rehab Service office as advised, Dr. Erlinda Hong, Neurology Services; Dr. Estanislado Pandy, Interventional Radiology Service, call for appointment; Dr. Curt Bears, Cardiology Services; and [facility].  SPECIAL INSTRUCTIONS:  No driving.  No smoking.     Lauraine Rinne, P.A.   ______________________________ Delice Lesch, MD    DA/MEDQ  D:  09/13/2017  T:  09/13/2017  Job:  [id number]  cc:   [facility] K. Estanislado Pandy, M.D. Delice Lesch, MD Dr. Curt Bears Dr. Erlinda Hong

## 2017-09-13 NOTE — Progress Notes (Signed)
Physical Therapy Discharge Summary  Patient Details  Name: [name] MRN: [id number] Date of Birth: 01-10-1961  Today's Date: 09/13/2017 PT Individual Time: [phone], 1300-1400 PT Individual Time Calculation (min): 53 min , 60 min   Patient has met 8 of 8 long term goals due to improved activity tolerance, improved balance, improved postural control, increased strength, ability to compensate for deficits and improved awareness.  Patient to discharge at an ambulatory level [place].   Patient's care partner is independent to provide the necessary physical assistance at discharge.Pt's husband has been present for multiple therapy sessions and has completed training on transfers, gait and stairs. Pt husband has practiced bumping the patient up/down 3 steps in the w/c.   All Goals Met.   Recommendation:  Patient will benefit from ongoing skilled PT services in home health setting to continue to advance safe functional mobility, address ongoing impairments in balance, strength, gait, and minimize fall risk.  Equipment: quad cane and w/c  Reasons for discharge: treatment goals met  Patient/family agrees with progress made and goals achieved: Yes   PT Treatment Interventions: Session 1: Pt seated in w/c upon PT arrival, agreeable to therapy tx and denies pain. Pt propelled w/c within unit >150 ft this session Mod I. Pt performed car transfer, squat pivot with supervision. Pt performed furniture transfers from w/c<>recliner and w/c<>couch squat pivot with supervision. Pt performed w/c<>bed transfer, stand pivot with supervision and verbal cues for technique. Discharge summary completed this session as detailed below. Pt provided with HEP hand out, reviewed exercises with patient and her husband. Pt propelled w/c back to room and left seated in w/c with needs in reach.   Session 2: Pt propelled w/c from room>gym mod I. Reviewed exercises from HEP including: x 10 sit<>stands, x 10 mini  squats with UE support, x 10 LAQ and x 10 heel slides. Pt received R AFO this session to assist with gait. Pt ambulated x 150 ft with quad cane and min assist using AFO, verbal cues for increased L step length and decreased R step length, verbal cues for gait speed, verbal cues for foot placement. Pt ascended/descended 4 steps using single hand rail and min assist. Pt and family educated that ambulation at home is primarily to be done during continued therapy. Pt can ambulate short distances within the home with husband providing min assist, but only as necessary to get to bedroom or bathroom. Pt left seated in w/c at end of session with needs in reach.    PT Discharge Precautions/Restrictions Precautions Precautions: Fall Precaution Comments: right hemiparesis Restrictions Weight Bearing Restrictions: No Pain Pain Assessment Pain Assessment: No/denies pain Cognition Overall Cognitive Status: Impaired/Different from baseline Arousal/Alertness: Awake/alert Orientation Level: Oriented X4 Sustained Attention: Appears intact Memory: Impaired Awareness: Impaired Problem Solving: Impaired Safety/Judgment: Impaired Sensation Sensation Light Touch: Appears Intact Proprioception: Appears Intact Additional Comments: B LE sensation appears intact Coordination Gross Motor Movements are Fluid and Coordinated: No Fine Motor Movements are Fluid and Coordinated: No Coordination and Movement Description: R hemiparesis UE>LE Heel Shin Test: impaired R LE Motor  Motor Motor: Hemiplegia Motor - Skilled Clinical Observations: R hemi  Trunk/Postural Assessment  Cervical Assessment Cervical Assessment: Within Functional Limits Thoracic Assessment Thoracic Assessment: Exceptions to WFL(rounded shoulders) Lumbar Assessment Lumbar Assessment: Within Functional Limits  Balance Balance Balance Assessed: Yes Static Sitting Balance Static Sitting - Level of Assistance: 5: Stand by  assistance Dynamic Sitting Balance Dynamic Sitting - Level of Assistance: 5: Stand by assistance Static Standing Balance  Static Standing - Level of Assistance: 5: Stand by assistance Dynamic Standing Balance Dynamic Standing - Level of Assistance: 4: Min assist Extremity Assessment  RLE Assessment RLE Assessment: Exceptions to [facility] RLE Strength Right Hip Flexion: 3-/5 Right Hip Extension: 3/5 Right Hip ABduction: 2+/5 Right Knee Flexion: 3-/5 Right Knee Extension: 3/5 Right Ankle Dorsiflexion: 2+/5 Right Ankle Plantar Flexion: 2+/5 LLE Assessment LLE Assessment: Within Functional Limits   See Function Navigator for Current Functional Status.  Netta Corrigan, PT, DPT 09/13/2017, 9:21 AM

## 2017-09-13 NOTE — Progress Notes (Signed)
[place] PHYSICAL MEDICINE & REHABILITATION     PROGRESS NOTE  Subjective/Complaints:  Patient seen sitting up in the bed this morning. She states she slept well overnight. She is looking forward to discharge tomorrow.  ROS: Denies nausea, vomiting, diarrhea, shortness of breath or chest pain   Objective: Vital Signs: Blood pressure 140/88, pulse 75, temperature 97.8 F (36.6 C), temperature source Oral, resp. rate 16, height 5\' 2"  (1.575 m), weight 59 kg (130 lb 1.1 oz), SpO2 94 %. No results found. Recent Labs    09/13/17 0541  WBC 10.6*  HGB 13.8  HCT 42.3  PLT 377   Recent Labs    09/13/17 0541  NA 136  K 3.3*  CL 100*  GLUCOSE 82  BUN 15  CREATININE 0.54  CALCIUM 8.8*   CBG (last 3)  No results for input(s): GLUCAP in the last 72 hours.  Wt Readings from Last 3 Encounters:  09/13/17 59 kg (130 lb 1.1 oz)  08/21/17 60.8 kg (134 lb)    Physical Exam:  BP 140/88 (BP Location: Left Arm)   Pulse 75   Temp 97.8 F (36.6 C) (Oral)   Resp 16   Ht 5\' 2"  (1.575 m)   Wt 59 kg (130 lb 1.1 oz)   LMP  (LMP Unknown)   SpO2 94%   BMI 23.79 kg/m  Constitutional: She appears well-developed and well-nourished.  HENT: Atraumatic. Normocephalic. Eyes: EOMI. No discharge.  Cardiovascular: RRR. No JVD   Respiratory: Normal effort. Clear. GI: Bowel sounds are normal. Non-distended Musculoskeletal: She exhibits no edema or tenderness.  Neurological: She is alert.  Right facial droop  A&Ox3 Word finding difficulty, improving Motor: Right upper extremity: 2-/5 shoulder abduction, distally 0/5  Right lower extremity: Hip flexion 4-/5, knee extension 3+/5, ADF/PF 2+/5 (unchanged) No increase in tone Skin: Rash resolved Psychiatric: Affect is pleasant and appropriate   Assessment/Plan: 1. 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 Physiatrist is providing close team supervision and 24 hour management of active medical problems listed below. Physiatrist and rehab team continue to assess barriers to discharge/monitor patient progress toward functional and medical goals.  Function:  Bathing Bathing position Bathing activity did not occur: N/A Position: Shower  Bathing parts Body parts bathed by patient: Right arm, Chest, Abdomen, Front perineal area, Right upper leg, Left upper leg, Left lower leg, Right lower leg, Buttocks, Left arm Body parts bathed by helper: Left arm  Bathing assist        Upper Body Dressing/Undressing Upper body dressing   What is the patient wearing?: Pull over shirt/dress     Pull over shirt/dress - Perfomed by patient: Thread/unthread left sleeve, Put head through opening, Pull shirt over trunk, Thread/unthread right sleeve Pull over shirt/dress - Perfomed by helper: Pull shirt over trunk, Thread/unthread right sleeve        Upper body assist Assist Level: Supervision or verbal cues      Lower Body Dressing/Undressing Lower body dressing   What is the patient wearing?: Pants, Shoes     Pants- Performed by patient: Thread/unthread left pants leg, Thread/unthread right pants leg, Pull pants up/down Pants- Performed by helper: Pull pants up/down, Thread/unthread right pants leg Non-skid slipper socks- Performed by patient: Don/doff right sock Non-skid slipper socks- Performed by helper: Don/doff left sock     Shoes - Performed by patient: Don/doff right shoe, Don/doff  left shoe Shoes - Performed by helper: Don/doff right shoe          Lower body assist Assist for lower body dressing: (2/5, 40%, max assist)      Toileting Toileting   Toileting steps completed by patient: Adjust clothing prior to toileting Toileting steps completed by helper: Adjust clothing prior to toileting, Performs perineal hygiene, Adjust clothing after toileting Toileting Assistive Devices:  Grab bar or rail  Toileting assist Assist level: Touching or steadying assistance (Pt.75%)   Transfers Chair/bed transfer   Chair/bed transfer method: Squat pivot Chair/bed transfer assist level: Supervision or verbal cues Chair/bed transfer assistive device: Armrests, Bedrails     Locomotion Ambulation     Max distance: 60 ft Assist level: Touching or steadying assistance (Pt > 75%)   Wheelchair   Type: Manual Max wheelchair distance: 150' Assist Level: Supervision or verbal cues  Cognition Comprehension Comprehension assist level: Follows basic conversation/direction with no assist(Simultaneous filing. User may not have seen previous data.)  Expression Expression assist level: Expresses basic 90% of the time/requires cueing < 10% of the time.(Simultaneous filing. User may not have seen previous data.)  Social Interaction Social Interaction assist level: Interacts appropriately 90% of the time - Needs monitoring or encouragement for participation or interaction.(Simultaneous filing. User may not have seen previous data.)  Problem Solving Problem solving assist level: Solves basic 90% of the time/requires cueing < 10% of the time(Simultaneous filing. User may not have seen previous data.)  Memory Memory assist level: Recognizes or recalls 75 - 89% of the time/requires cueing 10 - 24% of the time(Simultaneous filing. User may not have seen previous data.)    Medical Problem List and Plan: 1.  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 Status post loop recorder   Cont CIR    PRAFO/WHO     Fluoxetine started 12/12, increased on 12/19.   2.  DVT Prophylaxis/Anticoagulation: SCDs. Bilateral lower extremity Dopplers negative 3. Pain Management: Tylenol as needed 4. Mood: Provide emotional support 5. Neuropsych: This patient is not fully capable of making decisions on her own behalf. 6. Skin/Wound Care: Routine skin  checks 7. Fluids/Electrolytes/Nutrition: Routine I&O's 8. Tobacco abuse. Counseling/NicoDerm patch 9. Dysphagia. Follow-up speech therapy.   Advanced to regular diet with supervision 10. Hyperlipidemia. Lipitor 11. Hypertension.    Lopressor 12.5 d/ced due to allergy.    Overall controlled on 12/19 Vitals:   09/12/17 1424 09/13/17 0123  BP: 118/62 140/88  Pulse: 87 75  Resp: 16 16  Temp: 98.6 F (37 C) 97.8 F (36.6 C)  SpO2: 93% 94%   12. Transaminitis: Resolved   Labs WNL on 12/7   Cont to monitor 13. Drug allergy   To metoprolol, but was also on ASA.   Metoprolol d/ced   ASA restarted on 12/10, monitor closesly   Change Benadryl and pepcid to po   Cont steroid wean  14. Hypoalbuminemia   Supplement initiated on 12/7 15. Leukocytosis   WBCs 10.6 on 12/19   Afebrile, steroid effect    Cont to monitor 16. Hypokalemia   K+ 3.3 on 12/19   Supplemented 1 day   Continue to monitor  LOS (Days) 16 A FACE TO FACE EVALUATION WAS PERFORMED  Ankit Lorie Phenix 09/13/2017 8:08 AM

## 2017-09-13 NOTE — Discharge Summary (Signed)
Discharge summary job [phone]

## 2017-09-14 MED ORDER — FLUOXETINE HCL 20 MG PO CAPS: 20.0000 mg | ORAL_CAPSULE | Freq: Every day | ORAL | 3 refills | Status: DC

## 2017-09-14 MED ORDER — PREDNISONE 5 MG PO TABS: 5.0000 mg | ORAL_TABLET | Freq: Every day | ORAL | 0 refills | Status: DC

## 2017-09-14 MED ORDER — ASPIRIN 325 MG PO TBEC: 325.0000 mg | DELAYED_RELEASE_TABLET | Freq: Every day | ORAL | 0 refills | Status: DC

## 2017-09-14 MED ORDER — NICOTINE 14 MG/24HR TD PT24: 14.0000 mg | MEDICATED_PATCH | Freq: Every day | TRANSDERMAL | 0 refills | Status: DC | PRN

## 2017-09-14 MED ORDER — FAMOTIDINE 20 MG PO TABS: 20.0000 mg | ORAL_TABLET | Freq: Two times a day (BID) | ORAL | 0 refills | Status: DC

## 2017-09-14 MED ORDER — ATORVASTATIN CALCIUM 80 MG PO TABS: 80.0000 mg | ORAL_TABLET | Freq: Every day | ORAL | 11 refills | Status: DC

## 2017-09-14 NOTE — Progress Notes (Signed)
[place] PHYSICAL MEDICINE & REHABILITATION     PROGRESS NOTE  Subjective/Complaints:  Patient seen lying in bed this morning. She states she slept well overnight. She states she is anxious to go home finally.  ROS: Denies nausea, vomiting, diarrhea, shortness of breath or chest pain   Objective: Vital Signs: Blood pressure 110/75, pulse (!) 109, temperature 98.1 F (36.7 C), temperature source Oral, resp. rate 16, height 5\' 2"  (1.575 m), weight 59 kg (130 lb 1.1 oz), SpO2 94 %. No results found. Recent Labs    09/13/17 0541  WBC 10.6*  HGB 13.8  HCT 42.3  PLT 377   Recent Labs    09/13/17 0541  NA 136  K 3.3*  CL 100*  GLUCOSE 82  BUN 15  CREATININE 0.54  CALCIUM 8.8*   CBG (last 3)  No results for input(s): GLUCAP in the last 72 hours.  Wt Readings from Last 3 Encounters:  09/13/17 59 kg (130 lb 1.1 oz)  08/21/17 60.8 kg (134 lb)    Physical Exam:  BP 110/75 (BP Location: Right Leg)   Pulse (!) 109   Temp 98.1 F (36.7 C) (Oral)   Resp 16   Ht 5\' 2"  (1.575 m)   Wt 59 kg (130 lb 1.1 oz)   LMP  (LMP Unknown)   SpO2 94%   BMI 23.79 kg/m  Constitutional: She appears well-developed and well-nourished.  HENT: Atraumatic. Normocephalic. Eyes: EOMI. No discharge.  Cardiovascular: RRR. No JVD   Respiratory: Normal effort. Clear. GI: Bowel sounds are normal. Non-distended Musculoskeletal: She exhibits no edema or tenderness.  Neurological: She is alert.  Right facial droop  A&Ox3 Word finding difficulty, improving Motor: Right upper extremity: 2-/5 shoulder abduction, distally 0/5  Right lower extremity: Hip flexion 4-/5, knee extension 3+/5, ADF/PF 2+/5 (stable) No increase in tone Skin: Rash resolved Psychiatric: Affect is pleasant and appropriate   Assessment/Plan: 1. 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 Physiatrist is providing close team supervision and 24 hour management of active medical problems listed below. Physiatrist and rehab team continue to assess barriers to discharge/monitor patient progress toward functional and medical goals.  Function:  Bathing Bathing position Bathing activity did not occur: N/A Position: Shower  Bathing parts Body parts bathed by patient: Chest, Abdomen, Front perineal area, Right upper leg, Left upper leg, Left lower leg, Right lower leg, Buttocks, Right arm Body parts bathed by helper: Left arm  Bathing assist        Upper Body Dressing/Undressing Upper body dressing   What is the patient wearing?: Pull over shirt/dress     Pull over shirt/dress - Perfomed by patient: Thread/unthread left sleeve, Put head through opening, Pull shirt over trunk, Thread/unthread right sleeve Pull over shirt/dress - Perfomed by helper: Pull shirt over trunk, Thread/unthread right sleeve        Upper body assist Assist Level: Supervision or verbal cues      Lower Body Dressing/Undressing Lower body dressing   What is the patient wearing?: Pants, Shoes     Pants- Performed by patient: Thread/unthread left pants leg, Thread/unthread right pants leg, Pull pants up/down Pants- Performed by helper: Pull pants up/down, Thread/unthread right pants leg Non-skid slipper socks- Performed by patient: Don/doff right sock Non-skid slipper socks- Performed by helper: Don/doff left sock     Shoes - Performed by patient: Don/doff right shoe, Don/doff  left shoe, Fasten right, Fasten left Shoes - Performed by helper: Don/doff right shoe          Lower body assist Assist for lower body dressing: Supervision or verbal cues      Toileting Toileting   Toileting steps completed by patient: Adjust clothing prior to toileting Toileting steps completed by helper: Adjust clothing prior to toileting, Performs perineal hygiene, Adjust clothing after toileting Toileting  Assistive Devices: Grab bar or rail  Toileting assist Assist level: Supervision or verbal cues   Transfers Chair/bed transfer   Chair/bed transfer method: Squat pivot Chair/bed transfer assist level: Supervision or verbal cues Chair/bed transfer assistive device: Armrests, Bedrails     Locomotion Ambulation     Max distance: 150 ft Assist level: Touching or steadying assistance (Pt > 75%)   Wheelchair   Type: Manual Max wheelchair distance: 150' Assist Level: Supervision or verbal cues  Cognition Comprehension Comprehension assist level: Understands complex 90% of the time/cues 10% of the time  Expression Expression assist level: Expresses basic needs/ideas: With no assist  Social Interaction Social Interaction assist level: Interacts appropriately with others with medication or extra time (anti-anxiety, antidepressant).  Problem Solving Problem solving assist level: Solves basic 90% of the time/requires cueing < 10% of the time  Memory Memory assist level: Recognizes or recalls 75 - 89% of the time/requires cueing 10 - 24% of the time    Medical Problem List and Plan: 1.  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 Status post loop recorder   D/c today   Will see patient for transitional care management in 1-2 weeks   PRAFO/WHO     Fluoxetine started 12/12, increased on 12/19.   2.  DVT Prophylaxis/Anticoagulation: SCDs. Bilateral lower extremity Dopplers negative 3. Pain Management: Tylenol as needed 4. Mood: Provide emotional support 5. Neuropsych: This patient is not fully capable of making decisions on her own behalf. 6. Skin/Wound Care: Routine skin checks 7. Fluids/Electrolytes/Nutrition: Routine I&O's 8. Tobacco abuse. Counseling/NicoDerm patch 9. Dysphagia. Follow-up speech therapy.   Advanced to regular diet with intermittent supervision 10. Hyperlipidemia. Lipitor 11. Hypertension.    Lopressor  12.5 d/ced due to allergy.    Overall controlled on 12/20 Vitals:   09/13/17 1405 09/14/17 0500  BP: 125/71 110/75  Pulse: 100 (!) 109  Resp: 18 16  Temp: 98.2 F (36.8 C) 98.1 F (36.7 C)  SpO2: 95% 94%   12. Transaminitis: Resolved   Labs WNL on 12/7   Cont to monitor 13. Drug allergy   To metoprolol, but was also on ASA.   Metoprolol d/ced   ASA restarted on 12/10, monitor closesly   Changed Benadryl and pepcid to po   Prednisone DC'd on 12/21  14. Hypoalbuminemia   Supplement initiated on 12/7 15. Leukocytosis   WBCs 10.6 on 12/19   Afebrile, steroid effect    Cont to monitor 16. Hypokalemia   K+ 3.3 on 12/19   Supplemented 1 day   Follow-up as outpatient   Continue to monitor  LOS (Days) 17 A FACE TO FACE EVALUATION WAS PERFORMED  Ankit Lorie Phenix 09/14/2017 8:28 AM

## 2017-09-14 NOTE — Progress Notes (Signed)
Social Work  Discharge Note  The overall goal for the admission was met for:   Discharge location: Yes-HOME WITH HUSBAND WHO CAN PROVIDE 24 HR CARE  Length of Stay: Yes-17 DAYS  Discharge activity level: Yes-SUPERVISION-MIN ASSIST LEVEL  Home/community participation: Yes  Services provided included: MD, RD, PT, OT, SLP, RN, CM, TR, Pharmacy, Neuropsych and SW  Financial Services: Other: PENDING MEDICAID  Follow-up services arranged: Home Health: [facility]-PT,OT,SP, DME: [facility]-WHEELCHAIR, [facility], [facility] and Patient/Family has no preference for HH/DME agencies  Comments (or additional information):HUSBAND HAS BEEN HERE DAILY AND LEARNED HER CARE, BOTH FEEL COMFORTABLE WITH IT. HAVE SIGNED UP FOR HEALTH INSURANCE AS OF 1/1/209. SHE WILL GET HER OWN PCP, THE ONE SHE SAW BEFORE HEALTH INSURANCE LAPSED. SSD AND MEDICAID APPLICATIONS PENDING-HUSBAND WILL FOLLOW UP WITH THEM.  Patient/Family verbalized understanding of follow-up arrangements: Yes  Individual responsible for coordination of the follow-up plan: SELF & BOBBY-HUSBAND  Confirmed correct DME delivered: Elease Hashimoto 09/14/2017    Elease Hashimoto

## 2017-09-22 ENCOUNTER — Telehealth: Payer: Self-pay | Admitting: *Deleted

## 2017-09-22 NOTE — Telephone Encounter (Signed)
Magda Paganini, PT, [facility] left a message stating that there is some confusion with the patient, the lack of a PCP, and who is going to sign home health orders for the patient.  She is asking if Dr. Posey Pronto is willing to sign the home health orders for the patient?

## 2017-09-22 NOTE — Telephone Encounter (Signed)
Leslie notified.

## 2017-09-22 NOTE — Telephone Encounter (Signed)
I can sign the [place] orders, but patient needs to establish with a PCP.  Thanks

## 2017-09-26 DIAGNOSIS — I639 Cerebral infarction, unspecified: Secondary | ICD-10-CM | POA: Diagnosis not present

## 2017-09-27 ENCOUNTER — Ambulatory Visit (INDEPENDENT_AMBULATORY_CARE_PROVIDER_SITE_OTHER): Payer: Self-pay | Admitting: *Deleted

## 2017-09-27 DIAGNOSIS — R1310 Dysphagia, unspecified: Secondary | ICD-10-CM | POA: Diagnosis not present

## 2017-09-27 DIAGNOSIS — I10 Essential (primary) hypertension: Secondary | ICD-10-CM | POA: Diagnosis not present

## 2017-09-27 DIAGNOSIS — Z7982 Long term (current) use of aspirin: Secondary | ICD-10-CM | POA: Diagnosis not present

## 2017-09-27 DIAGNOSIS — F17210 Nicotine dependence, cigarettes, uncomplicated: Secondary | ICD-10-CM | POA: Diagnosis not present

## 2017-09-27 DIAGNOSIS — I63512 Cerebral infarction due to unspecified occlusion or stenosis of left middle cerebral artery: Secondary | ICD-10-CM

## 2017-09-27 DIAGNOSIS — I82409 Acute embolism and thrombosis of unspecified deep veins of unspecified lower extremity: Secondary | ICD-10-CM | POA: Diagnosis not present

## 2017-09-27 DIAGNOSIS — E785 Hyperlipidemia, unspecified: Secondary | ICD-10-CM | POA: Diagnosis not present

## 2017-09-27 DIAGNOSIS — I69391 Dysphagia following cerebral infarction: Secondary | ICD-10-CM | POA: Diagnosis not present

## 2017-09-27 DIAGNOSIS — G8929 Other chronic pain: Secondary | ICD-10-CM | POA: Diagnosis not present

## 2017-09-27 DIAGNOSIS — I69351 Hemiplegia and hemiparesis following cerebral infarction affecting right dominant side: Secondary | ICD-10-CM | POA: Diagnosis not present

## 2017-09-28 DIAGNOSIS — I82409 Acute embolism and thrombosis of unspecified deep veins of unspecified lower extremity: Secondary | ICD-10-CM | POA: Diagnosis not present

## 2017-09-28 DIAGNOSIS — I69351 Hemiplegia and hemiparesis following cerebral infarction affecting right dominant side: Secondary | ICD-10-CM | POA: Diagnosis not present

## 2017-09-28 DIAGNOSIS — F17210 Nicotine dependence, cigarettes, uncomplicated: Secondary | ICD-10-CM | POA: Diagnosis not present

## 2017-09-28 DIAGNOSIS — I69391 Dysphagia following cerebral infarction: Secondary | ICD-10-CM | POA: Diagnosis not present

## 2017-09-28 DIAGNOSIS — R1310 Dysphagia, unspecified: Secondary | ICD-10-CM | POA: Diagnosis not present

## 2017-09-28 DIAGNOSIS — Z7982 Long term (current) use of aspirin: Secondary | ICD-10-CM | POA: Diagnosis not present

## 2017-09-28 DIAGNOSIS — I10 Essential (primary) hypertension: Secondary | ICD-10-CM | POA: Diagnosis not present

## 2017-09-28 DIAGNOSIS — G8929 Other chronic pain: Secondary | ICD-10-CM | POA: Diagnosis not present

## 2017-09-28 DIAGNOSIS — E785 Hyperlipidemia, unspecified: Secondary | ICD-10-CM | POA: Diagnosis not present

## 2017-09-28 NOTE — Progress Notes (Signed)
[facility] Summary Report / Loop Recorder 

## 2017-09-29 ENCOUNTER — Telehealth: Payer: Self-pay | Admitting: Neurology

## 2017-09-29 NOTE — Telephone Encounter (Signed)
Bruce/[facility] [phone] called request OT 2 x 3. Please call to advise

## 2017-09-29 NOTE — Telephone Encounter (Signed)
Rn call Bruce from [facility] that per Dr. Erlinda Hong therapy for OT can be two times a week for 3 weeks. The order was given.Bruce verbalized understanding.

## 2017-10-01 NOTE — Telephone Encounter (Addendum)
I agree. Thank you, Katrina.   Rosalin Hawking, MD PhD Stroke Neurology 10/01/2017 10:46 PM

## 2017-10-03 DIAGNOSIS — F17210 Nicotine dependence, cigarettes, uncomplicated: Secondary | ICD-10-CM | POA: Diagnosis not present

## 2017-10-03 DIAGNOSIS — E785 Hyperlipidemia, unspecified: Secondary | ICD-10-CM | POA: Diagnosis not present

## 2017-10-03 DIAGNOSIS — I69391 Dysphagia following cerebral infarction: Secondary | ICD-10-CM | POA: Diagnosis not present

## 2017-10-03 DIAGNOSIS — I10 Essential (primary) hypertension: Secondary | ICD-10-CM | POA: Diagnosis not present

## 2017-10-03 DIAGNOSIS — Z7982 Long term (current) use of aspirin: Secondary | ICD-10-CM | POA: Diagnosis not present

## 2017-10-03 DIAGNOSIS — R1310 Dysphagia, unspecified: Secondary | ICD-10-CM | POA: Diagnosis not present

## 2017-10-03 DIAGNOSIS — I82409 Acute embolism and thrombosis of unspecified deep veins of unspecified lower extremity: Secondary | ICD-10-CM | POA: Diagnosis not present

## 2017-10-03 DIAGNOSIS — G8929 Other chronic pain: Secondary | ICD-10-CM | POA: Diagnosis not present

## 2017-10-03 DIAGNOSIS — I69351 Hemiplegia and hemiparesis following cerebral infarction affecting right dominant side: Secondary | ICD-10-CM | POA: Diagnosis not present

## 2017-10-04 DIAGNOSIS — Z7982 Long term (current) use of aspirin: Secondary | ICD-10-CM | POA: Diagnosis not present

## 2017-10-04 DIAGNOSIS — I10 Essential (primary) hypertension: Secondary | ICD-10-CM | POA: Diagnosis not present

## 2017-10-04 DIAGNOSIS — F17210 Nicotine dependence, cigarettes, uncomplicated: Secondary | ICD-10-CM | POA: Diagnosis not present

## 2017-10-04 DIAGNOSIS — E785 Hyperlipidemia, unspecified: Secondary | ICD-10-CM | POA: Diagnosis not present

## 2017-10-04 DIAGNOSIS — I69351 Hemiplegia and hemiparesis following cerebral infarction affecting right dominant side: Secondary | ICD-10-CM | POA: Diagnosis not present

## 2017-10-04 DIAGNOSIS — G8929 Other chronic pain: Secondary | ICD-10-CM | POA: Diagnosis not present

## 2017-10-04 DIAGNOSIS — I82409 Acute embolism and thrombosis of unspecified deep veins of unspecified lower extremity: Secondary | ICD-10-CM | POA: Diagnosis not present

## 2017-10-04 DIAGNOSIS — I69391 Dysphagia following cerebral infarction: Secondary | ICD-10-CM | POA: Diagnosis not present

## 2017-10-04 DIAGNOSIS — R1310 Dysphagia, unspecified: Secondary | ICD-10-CM | POA: Diagnosis not present

## 2017-10-05 ENCOUNTER — Other Ambulatory Visit: Payer: Self-pay

## 2017-10-05 ENCOUNTER — Other Ambulatory Visit (HOSPITAL_COMMUNITY): Payer: Self-pay | Admitting: Interventional Radiology

## 2017-10-05 ENCOUNTER — Encounter: Payer: Self-pay | Admitting: Physical Medicine & Rehabilitation

## 2017-10-05 ENCOUNTER — Encounter
Payer: BLUE CROSS/BLUE SHIELD | Attending: Physical Medicine & Rehabilitation | Admitting: Physical Medicine & Rehabilitation

## 2017-10-05 VITALS — BP 156/92 | HR 110

## 2017-10-05 DIAGNOSIS — I69391 Dysphagia following cerebral infarction: Secondary | ICD-10-CM

## 2017-10-05 DIAGNOSIS — T50905A Adverse effect of unspecified drugs, medicaments and biological substances, initial encounter: Secondary | ICD-10-CM | POA: Insufficient documentation

## 2017-10-05 DIAGNOSIS — I10 Essential (primary) hypertension: Secondary | ICD-10-CM | POA: Diagnosis not present

## 2017-10-05 DIAGNOSIS — G8191 Hemiplegia, unspecified affecting right dominant side: Secondary | ICD-10-CM

## 2017-10-05 DIAGNOSIS — F17210 Nicotine dependence, cigarettes, uncomplicated: Secondary | ICD-10-CM | POA: Diagnosis not present

## 2017-10-05 DIAGNOSIS — E785 Hyperlipidemia, unspecified: Secondary | ICD-10-CM

## 2017-10-05 DIAGNOSIS — Z889 Allergy status to unspecified drugs, medicaments and biological substances status: Secondary | ICD-10-CM

## 2017-10-05 DIAGNOSIS — R1310 Dysphagia, unspecified: Secondary | ICD-10-CM | POA: Diagnosis not present

## 2017-10-05 DIAGNOSIS — I63512 Cerebral infarction due to unspecified occlusion or stenosis of left middle cerebral artery: Secondary | ICD-10-CM | POA: Diagnosis not present

## 2017-10-05 DIAGNOSIS — I639 Cerebral infarction, unspecified: Secondary | ICD-10-CM | POA: Diagnosis not present

## 2017-10-05 DIAGNOSIS — R21 Rash and other nonspecific skin eruption: Secondary | ICD-10-CM | POA: Insufficient documentation

## 2017-10-05 NOTE — Progress Notes (Signed)
Subjective:    Patient ID: [name], female    DOB: May 26, 1961, 57 y.o.   MRN: [id number]  HPI 57 year old right-handed female with unremarkable past history except tobacco abuse, on no prescription medication, presents for hospital follow up after receiving CIR for acute left MCA infarct status post TPA and thrombectomy.  DATE OF ADMISSION:  08/28/2017 DATE OF DISCHARGE:  09/14/2017  History supplemented by husband. At discharge she was instructed to follow up with Neurology, with whom she has an appointment next month. She has an appointment with VIR this month.  She does not have an appointment with Cards.  She has no scheduled with a PCP. She has not been smoking since discharge. She has not been wearing her PRAFO/WHO. She is not having difficulty swallowing. Her BP remains elevated. She continues to have a rash.  She has stopped taking Prozaac and Lipitor since her rash.   Mobility: Cane and wheelchair in house and wheelchair in community. Therapies: 2/week. DME: Shower chair, bedside commode  Pain Inventory Average Pain 5 Pain Right Now 5 My pain is no pain  In the last 24 hours, has pain interfered with the following? General activity 0 Relation with others 0 Enjoyment of life 0 What TIME of day is your pain at its worst? no pain Sleep (in general) Good  Pain is worse with: no pain Pain improves with: no pain Relief from Meds: 3  Mobility walk with assistance use a cane how many minutes can you walk? 5 ability to climb steps?  no do you drive?  no use a wheelchair needs help with transfers Do you have any goals in this area?  yes  Function not employed: date last employed . I need assistance with the following:  meal prep, household duties and shopping Do you have any goals in this area?  yes  Neuro/Psych No problems in this area  Prior Studies Any changes since last visit?  no  Physicians involved in your care Any changes since last visit?   no   Family History  Problem Relation Age of Onset  . Heart attack Father    Social History   Socioeconomic History  . Marital status: Single    Spouse name: None  . Number of children: None  . Years of education: None  . Highest education level: None  Social Needs  . Financial resource strain: None  . Food insecurity - worry: None  . Food insecurity - inability: None  . Transportation needs - medical: None  . Transportation needs - non-medical: None  Occupational History  . None  Tobacco Use  . Smoking status: Current Every Day Smoker    Types: Cigarettes  . Smokeless tobacco: Never Used  Substance and Sexual Activity  . Alcohol use: No    Frequency: Never  . Drug use: No  . Sexual activity: Yes    Partners: Male  Other Topics Concern  . None  Social History Narrative  . None   Past Surgical History:  Procedure Laterality Date  . IR ANGIO INTRA EXTRACRAN SEL COM CAROTID INNOMINATE UNI R MOD SED  08/21/2017  . IR ANGIO VERTEBRAL SEL SUBCLAVIAN INNOMINATE UNI R MOD SED  08/21/2017  . IR PERCUTANEOUS ART THROMBECTOMY/INFUSION INTRACRANIAL INC DIAG ANGIO  08/21/2017  . LOOP RECORDER INSERTION N/A 08/28/2017   Procedure: LOOP RECORDER INSERTION;  Surgeon: Constance Haw, MD;  Location: [place] [facility];  Service: Cardiovascular;  Laterality: N/A;  . RADIOLOGY WITH ANESTHESIA N/A  08/21/2017   Procedure: RADIOLOGY WITH ANESTHESIA;  Surgeon: Luanne Bras, MD;  Location: [place];  Service: Radiology;  Laterality: N/A;  . TEE WITHOUT CARDIOVERSION N/A 08/28/2017   Procedure: TRANSESOPHAGEAL ECHOCARDIOGRAM (TEE);  Surgeon: Fay Records, MD;  Location: [facility] ENDOS[facility];  Service: Cardiovascular;  Laterality: N/A;   No past medical history on file. BP (!) 156/92   Pulse (!) 110   LMP  (LMP Unknown)   SpO2 96%   Opioid Risk Score:   Fall Risk Score:  `1  Depression screen PHQ 2/9  Depression screen [facility] 2/9 10/05/2017 10/05/2017  Decreased Interest 1 0  Down,  Depressed, Hopeless 0 0  PHQ - 2 Score 1 0  Altered sleeping 0 -  Tired, decreased energy 2 -  Change in appetite 0 -  Feeling bad or failure about yourself  0 -  Trouble concentrating 1 -  Moving slowly or fidgety/restless 1 -  Suicidal thoughts 0 -  PHQ-9 Score 5 -  Difficult doing work/chores Not difficult at all -    Review of Systems  Constitutional: Negative.   HENT: Negative.   Eyes: Negative.   Respiratory: Negative.   Cardiovascular: Negative.   Gastrointestinal: Negative.   Endocrine: Negative.   Genitourinary: Negative.   Musculoskeletal: Negative.   Skin: Positive for rash.  Allergic/Immunologic: Negative.   Neurological: Negative.   Hematological: Negative.   Psychiatric/Behavioral: Negative.        Objective:   Physical Exam Constitutional: She appears well-developed and well-nourished.  HENT: Atraumatic. Normocephalic. Eyes: EOMI. No discharge.  Cardiovascular: RRR. No JVD   Respiratory: Normal effort. Clear. GI: Bowel sounds are normal. Non-distended Musculoskeletal: She exhibits no edema or tenderness.  Neurological: She is alert.  Right facial droop  A&Ox3 Word finding difficulty, improving Motor: Right upper extremity: 1+/5 shoulder abduction, distally 0/5  Right lower extremity: Hip flexion 4-/5, knee extension 4-/5, ADF/PF 3-/5  No increase in tone Skin: Scattered rash Psychiatric: Affect is pleasant and appropriate    Assessment & Plan:  57 year old right-handed female with unremarkable past history except tobacco abuse, on no prescription medication, presents for hospital follow up after receiving CIR for acute left MCA infarct status post TPA and thrombectomy.  1. 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 Status post loop recorder  Cont therapies  Encouraged compliance with PRAFO/WHO   Cont Fluoxetine    Needs appointment with PCP  2. Dysphagia.    Improving   Cont  therapies  3. Hyperlipidemia   Cont Lipitor  4. Hypertension.    Needs follow up with PCP  5. Drug allergy   Thought to be secondary to metoprolol, possibly also ASA.  Trial no ASA for 1 week to evaluate benefit with rash.  Also encouraged hygience  Benadryl/Cortisone cream PRN    Meds reviewed Referrals reviewed, needs appointment with PCP and Cards All questions answered

## 2017-10-06 DIAGNOSIS — I69351 Hemiplegia and hemiparesis following cerebral infarction affecting right dominant side: Secondary | ICD-10-CM | POA: Diagnosis not present

## 2017-10-06 DIAGNOSIS — G8929 Other chronic pain: Secondary | ICD-10-CM | POA: Diagnosis not present

## 2017-10-06 DIAGNOSIS — I10 Essential (primary) hypertension: Secondary | ICD-10-CM | POA: Diagnosis not present

## 2017-10-06 DIAGNOSIS — I82409 Acute embolism and thrombosis of unspecified deep veins of unspecified lower extremity: Secondary | ICD-10-CM | POA: Diagnosis not present

## 2017-10-06 DIAGNOSIS — Z7982 Long term (current) use of aspirin: Secondary | ICD-10-CM | POA: Diagnosis not present

## 2017-10-06 DIAGNOSIS — I69391 Dysphagia following cerebral infarction: Secondary | ICD-10-CM | POA: Diagnosis not present

## 2017-10-06 DIAGNOSIS — E785 Hyperlipidemia, unspecified: Secondary | ICD-10-CM | POA: Diagnosis not present

## 2017-10-06 DIAGNOSIS — F17210 Nicotine dependence, cigarettes, uncomplicated: Secondary | ICD-10-CM | POA: Diagnosis not present

## 2017-10-06 DIAGNOSIS — R1310 Dysphagia, unspecified: Secondary | ICD-10-CM | POA: Diagnosis not present

## 2017-10-11 LAB — CUP PACEART REMOTE DEVICE CHECK
Date Time Interrogation Session: 20190102183847
MDC IDC PG IMPLANT DT: 20181203

## 2017-10-12 ENCOUNTER — Ambulatory Visit: Payer: BLUE CROSS/BLUE SHIELD | Admitting: Family Medicine

## 2017-10-12 DIAGNOSIS — F17210 Nicotine dependence, cigarettes, uncomplicated: Secondary | ICD-10-CM | POA: Diagnosis not present

## 2017-10-12 DIAGNOSIS — I82409 Acute embolism and thrombosis of unspecified deep veins of unspecified lower extremity: Secondary | ICD-10-CM | POA: Diagnosis not present

## 2017-10-12 DIAGNOSIS — I69351 Hemiplegia and hemiparesis following cerebral infarction affecting right dominant side: Secondary | ICD-10-CM | POA: Diagnosis not present

## 2017-10-12 DIAGNOSIS — Z7982 Long term (current) use of aspirin: Secondary | ICD-10-CM | POA: Diagnosis not present

## 2017-10-12 DIAGNOSIS — G8929 Other chronic pain: Secondary | ICD-10-CM | POA: Diagnosis not present

## 2017-10-12 DIAGNOSIS — E785 Hyperlipidemia, unspecified: Secondary | ICD-10-CM | POA: Diagnosis not present

## 2017-10-12 DIAGNOSIS — I69391 Dysphagia following cerebral infarction: Secondary | ICD-10-CM | POA: Diagnosis not present

## 2017-10-12 DIAGNOSIS — R1310 Dysphagia, unspecified: Secondary | ICD-10-CM | POA: Diagnosis not present

## 2017-10-12 DIAGNOSIS — I10 Essential (primary) hypertension: Secondary | ICD-10-CM | POA: Diagnosis not present

## 2017-10-13 ENCOUNTER — Ambulatory Visit: Payer: BLUE CROSS/BLUE SHIELD | Admitting: Family Medicine

## 2017-10-13 ENCOUNTER — Encounter: Payer: Self-pay | Admitting: Family Medicine

## 2017-10-13 VITALS — BP 131/70 | HR 105 | Temp 97.7°F | Ht 61.0 in | Wt 125.1 lb

## 2017-10-13 DIAGNOSIS — L509 Urticaria, unspecified: Secondary | ICD-10-CM

## 2017-10-13 DIAGNOSIS — G8191 Hemiplegia, unspecified affecting right dominant side: Secondary | ICD-10-CM | POA: Diagnosis not present

## 2017-10-13 DIAGNOSIS — E785 Hyperlipidemia, unspecified: Secondary | ICD-10-CM

## 2017-10-13 DIAGNOSIS — Z7689 Persons encountering health services in other specified circumstances: Secondary | ICD-10-CM

## 2017-10-13 DIAGNOSIS — I63512 Cerebral infarction due to unspecified occlusion or stenosis of left middle cerebral artery: Secondary | ICD-10-CM

## 2017-10-13 DIAGNOSIS — Z23 Encounter for immunization: Secondary | ICD-10-CM | POA: Diagnosis not present

## 2017-10-13 DIAGNOSIS — E876 Hypokalemia: Secondary | ICD-10-CM | POA: Diagnosis not present

## 2017-10-13 DIAGNOSIS — Z87891 Personal history of nicotine dependence: Secondary | ICD-10-CM | POA: Insufficient documentation

## 2017-10-13 DIAGNOSIS — F4329 Adjustment disorder with other symptoms: Secondary | ICD-10-CM

## 2017-10-13 DIAGNOSIS — L500 Allergic urticaria: Secondary | ICD-10-CM | POA: Insufficient documentation

## 2017-10-13 LAB — BASIC METABOLIC PANEL
BUN: 11 mg/dL (ref 6–23)
CALCIUM: 9.5 mg/dL (ref 8.4–10.5)
CO2: 32 meq/L (ref 19–32)
Chloride: 101 mEq/L (ref 96–112)
Creatinine, Ser: 0.55 mg/dL (ref 0.40–1.20)
GFR: 120.88 mL/min (ref >60.00)
GLUCOSE: 108 mg/dL — AB (ref 70–99)
Potassium: 3.9 mEq/L (ref 3.5–5.1)
Sodium: 141 mEq/L (ref 135–145)

## 2017-10-13 MED ORDER — HYDROXYZINE HCL 10 MG PO TABS: 10.0000 mg | ORAL_TABLET | Freq: Every day | ORAL | 0 refills | Status: DC

## 2017-10-13 MED ORDER — ESCITALOPRAM OXALATE 10 MG PO TABS: 10.0000 mg | ORAL_TABLET | Freq: Every day | ORAL | 0 refills | Status: DC

## 2017-10-13 MED ORDER — ROSUVASTATIN CALCIUM 20 MG PO TABS: 20.0000 mg | ORAL_TABLET | Freq: Every day | ORAL | 3 refills | Status: DC

## 2017-10-13 MED ORDER — FAMOTIDINE 20 MG PO TABS: 20.0000 mg | ORAL_TABLET | Freq: Two times a day (BID) | ORAL | 3 refills | Status: DC

## 2017-10-13 MED ORDER — ASPIRIN 325 MG PO TBEC: 325.0000 mg | DELAYED_RELEASE_TABLET | Freq: Every day | ORAL | 3 refills | Status: DC

## 2017-10-13 NOTE — Patient Instructions (Addendum)
It was a pleasure to meet you today. Please schedule your physical. Makr sure it is a at least 1 year from prior physical.  Restart aspirin.  Start lexapro instead of prozac.  I have referred you to Physical therapy outpatient.  I will call in a new cholesterol medication and you will start it in 1 week.  Hives:  - start over the counter Allegra (one a day) - Vistaril at night only. Start at 1 pill and if needed and does not cause you to be too tired, you can increase to 2 pills before bed.   We will call you with your lab results once available.  Follow up in 4 weeks.   Please help [place] help you:  We are honored you have chosen [facility] for your Primary Care home. Below you will find basic instructions that you may need to access in the future. Please help [place] help you by reading the instructions, which cover many of the frequent questions we experience.   Prescription refills and request:  -In order to allow more efficient response time, please call your pharmacy for all refills. They will forward the request electronically to [place]. This allows for the quickest possible response. Request left on a nurse line can take longer to refill, since these are checked as time allows between office patients and other phone calls.  - refill request can take up to 3-5 working days to complete.  - If request is sent electronically and request is appropiate, it is usually completed in 1-2 business days.  - all patients will need to be seen routinely for all chronic medical conditions requiring prescription medications (see follow-up below). If you are overdue for follow up on your condition, you will be asked to make an appointment and we will call in enough medication to cover you until your appointment (up to 30 days).  - all controlled substances will require a face to face visit to request/refill.  - if you desire your prescriptions to go through a new pharmacy, and have an active script at  original pharmacy, you will need to call your pharmacy and have scripts transferred to new pharmacy. This is completed between the pharmacy locations and not by your provider.    Results: If any images or labs were ordered, it can take up to 1 week to get results depending on the test ordered and the lab/facility running and resulting the test. - Normal or stable results, which do not need further discussion, may be released to your mychart immediately with attached note to you. A call may not be generated for normal results. Please make certain to sign up for mychart. If you have questions on how to activate your mychart you can call the front office.  - If your results need further discussion, our office will attempt to contact you via phone, and if unable to reach you after 2 attempts, we will release your abnormal result to your mychart with instructions.  - All results will be automatically released in mychart after 1 week.  - Your provider will provide you with explanation and instruction on all relevant material in your results. Please keep in mind, results and labs may appear confusing or abnormal to the untrained eye, but it does not mean they are actually abnormal for you personally. If you have any questions about your results that are not covered, or you desire more detailed explanation than what was provided, you should make an appointment with your  provider to do so.   Our office handles many outgoing and incoming calls daily. If we have not contacted you within 1 week about your results, please check your mychart to see if there is a message first and if not, then contact our office.  In helping with this matter, you help decrease call volume, and therefore allow [place] to be able to respond to patients needs more efficiently.   Acute office visits (sick visit):  An acute visit is intended for a new problem and are scheduled in shorter time slots to allow schedule openings for patients with  new problems. This is the appropriate visit to discuss a new problem. In order to provide you with excellent quality medical care with proper time for you to explain your problem, have an exam and receive treatment with instructions, these appointments should be limited to one new problem per visit. If you experience a new problem, in which you desire to be addressed, please make an acute office visit, we save openings on the schedule to accommodate you. Please do not save your new problem for any other type of visit, let us take care of it properly and quickly for you.   Follow up visits:  Depending on your condition(s) your provider will need to see you routinely in order to provide you with quality care and prescribe medication(s). Most chronic conditions (Example: hypertension, Diabetes, depression/anxiety... etc), require visits a couple times a year. Your provider will instruct you on proper follow up for your personal medical conditions and history. Please make certain to make follow up appointments for your condition as instructed. Failing to do so could result in lapse in your medication treatment/refills. If you request a refill, and are overdue to be seen on a condition, we will always provide you with a 30 day script (once) to allow you time to schedule.    Medicare wellness (well visit): - we have a wonderful Nurse Maudie Mercury), that will meet with you and provide you will yearly medicare wellness visits. These visits should occur yearly (can not be scheduled less than 1 calendar year apart) and cover preventive health, immunizations, advance directives and screenings you are entitled to yearly through your medicare benefits. Do not miss out on your entitled benefits, this is when medicare will pay for these benefits to be ordered for you.  These are strongly encouraged by your provider and is the appropriate type of visit to make certain you are up to date with all preventive health benefits. If you  have not had your medicare wellness exam in the last 12 months, please make certain to schedule one by calling the office and schedule your medicare wellness with Maudie Mercury as soon as possible.   Yearly physical (well visit):  - Adults are recommended to be seen yearly for physicals. Check with your insurance and date of your last physical, most insurances require one calendar year between physicals. Physicals include all preventive health topics, screenings, medical exam and labs that are appropriate for gender/age and history. You may have fasting labs needed at this visit. This is a well visit (not a sick visit), new problems should not be covered during this visit (see acute visit).  - Pediatric patients are seen more frequently when they are younger. Your provider will advise you on well child visit timing that is appropriate for your their age. - This is not a medicare wellness visit. Medicare wellness exams do not have an exam portion to the visit. Some  medicare companies allow for a physical, some do not allow a yearly physical. If your medicare allows a yearly physical you can schedule the medicare wellness with our nurse Maudie Mercury and have your physical with your provider after, on the same day. Please check with insurance for your full benefits.   Late Policy/No Shows:  - all new patients should arrive 15-30 minutes earlier than appointment to allow [place] time  to  obtain all personal demographics,  insurance information and for you to complete office paperwork. - All established patients should arrive 10-15 minutes earlier than appointment time to update all information and be checked in .  - In our best efforts to run on time, if you are late for your appointment you will be asked to either reschedule or if able, we will work you back into the schedule. There will be a wait time to work you back in the schedule,  depending on availability.  - If you are unable to make it to your appointment as scheduled,  please call 24 hours ahead of time to allow [place] to fill the time slot with someone else who needs to be seen. If you do not cancel your appointment ahead of time, you may be charged a no show fee.

## 2017-10-13 NOTE — Progress Notes (Signed)
Patient ID: [name], female  DOB: [birth year], 57 y.o.   MRN: [id number] Patient Care Team    Relationship Specialty Notifications Start End  [name], DO PCP - General Family Medicine  10/13/17   Constance Haw, MD Consulting Physician Cardiology  10/13/17   Rosalin Hawking, MD Consulting Physician Neurology  10/13/17   Jamse Arn, MD Consulting Physician Physical Medicine and Rehabilitation  10/13/17   Luanne Bras, MD Consulting Physician Interventional Radiology  10/13/17     Chief Complaint  Patient presents with  . Establish Care    for routine chronic conditions    Subjective:  [name] is a 57 y.o.  female present for new patient establishment. All past medical history, surgical history, allergies, family history, immunizations, medications and social history were obtained and entered in the electronic medical record today. All recent labs, ED visits and hospitalizations within the last year were reviewed.   Urticaria/Stress: Patient reports she has had chronic urticaria since the hospitalization for her stroke in November. She has tried discontinuing her medications and there seems to be no offending agents. Her urticaria persisted despite her not taking her medications. Patient states she doesn't feel particularly anxious but she understands her body is going through a great deal of stress. She denies any changes in personal hygiene products.  Hypokalemia Mildly low on DC.   Cerebrovascular accident (CVA) due to occlusion of left middle cerebral artery (HCC)/ Right hemiplegia (HCC)/HLD/former smoker Patient suffered from a left middle cerebral artery CVA 08/21/2017. She has completed inpatient and home health physical therapy. Right-sided upper and lower extremity deficits remain. She reports very minimal use of her right arm. She reports weakness with her right lower extremity but is able to walk with assistance for short periods if  needed. She has been prescribed aspirin 325 mg a day. She also was prescribed Lipitor, however this is giving her diarrhea since she has stopped using it. She is established with neurology and cardiology and has a loop recorder. She reports she needs a referral to outpatient neuro rehabilitation. She reports she has quit smoking since having a stroke.  Depression screen [facility] 2/9 10/13/2017 10/05/2017 10/05/2017  Decreased Interest 0 1 0  Down, Depressed, Hopeless 0 0 0  PHQ - 2 Score 0 1 0  Altered sleeping - 0 -  Tired, decreased energy - 2 -  Change in appetite - 0 -  Feeling bad or failure about yourself  - 0 -  Trouble concentrating - 1 -  Moving slowly or fidgety/restless - 1 -  Suicidal thoughts - 0 -  PHQ-9 Score - 5 -  Difficult doing work/chores - Not difficult at all -   No flowsheet data found.        Fall Risk  10/05/2017  Falls in the past year? No   Immunization History  Administered Date(s) Administered  . Influenza,inj,Quad PF,6+ Mos 10/13/2017    No exam data present  Past Medical History:  Diagnosis Date  . Hay fever   . Stroke ([place]) 08/23/2017   right sided deficits.    Allergies  Allergen Reactions  . Lopressor [Metoprolol Tartrate]     Angioedema  . Adhesive [Tape]   . Lipitor [Atorvastatin] Diarrhea   Past Surgical History:  Procedure Laterality Date  . IR ANGIO INTRA EXTRACRAN SEL COM CAROTID INNOMINATE UNI R MOD SED  08/21/2017  . IR ANGIO VERTEBRAL SEL SUBCLAVIAN INNOMINATE UNI R MOD SED  08/21/2017  .  IR PERCUTANEOUS ART THROMBECTOMY/INFUSION INTRACRANIAL INC DIAG ANGIO  08/21/2017  . LOOP RECORDER INSERTION N/A 08/28/2017   Procedure: LOOP RECORDER INSERTION;  Surgeon: Constance Haw, MD;  Location: [facility];  Service: Cardiovascular;  Laterality: N/A;  . RADIOLOGY WITH ANESTHESIA N/A 08/21/2017   Procedure: RADIOLOGY WITH ANESTHESIA;  Surgeon: Luanne Bras, MD;  Location: [facility];  Service: Radiology;  Laterality: N/A;    . TEE WITHOUT CARDIOVERSION N/A 08/28/2017   Procedure: TRANSESOPHAGEAL ECHOCARDIOGRAM (TEE);  Surgeon: Fay Records, MD;  Location: [facility] ENDOSCOPY;  Service: Cardiovascular;  Laterality: N/A;   Family History  Problem Relation Age of Onset  . Heart attack Father   . Diabetes Mother   . Hypertension Mother   . Hyperlipidemia Mother   . Diabetes type II Mother   . Hypertension Brother    Social History   Socioeconomic History  . Marital status: Married    Spouse name: Not on file  . Number of children: 2  . Years of education: Not on file  . Highest education level: Not on file  Social Needs  . Financial resource strain: Not on file  . Food insecurity - worry: Not on file  . Food insecurity - inability: Not on file  . Transportation needs - medical: Not on file  . Transportation needs - non-medical: Not on file  Occupational History  . Not on file  Tobacco Use  . Smoking status: Former Smoker    Types: Cigarettes  . Smokeless tobacco: Never Used  Substance and Sexual Activity  . Alcohol use: No    Frequency: Never  . Drug use: No  . Sexual activity: Yes    Partners: Male  Other Topics Concern  . Not on file  Social History Narrative   Married. 2 children.    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    Walks with cane or wheelchair.    Former smoker.    Drinks caffeine.    Smoke alarm in the home.   Firearms in the home.    Wears her seatbelt.    Feels safe In her relationships.    Allergies as of 10/13/2017      Reactions   Lopressor [metoprolol Tartrate]    Angioedema   Adhesive [tape]    Lipitor [atorvastatin] Diarrhea      Medication List        Accurate as of 10/13/17  3:34 PM. Always use your most recent med list.          aspirin 325 MG EC tablet Take 1 tablet (325 mg total) by mouth daily.   escitalopram 10 MG tablet Commonly known as:  LEXAPRO Take 1 tablet (10 mg total) by mouth daily.   famotidine 20 MG tablet Commonly known as:  PEPCID Take  1 tablet (20 mg total) by mouth 2 (two) times daily.   hydrOXYzine 10 MG tablet Commonly known as:  ATARAX/VISTARIL Take 1-2 tablets (10-20 mg total) by mouth at bedtime.       All past medical history, surgical history, allergies, family history, immunizations andmedications were updated in the EMR today and reviewed under the history and medication portions of their EMR.      No results found.   ROS: 14 pt review of systems performed and negative (unless mentioned in an HPI)  Objective: BP 131/70 (BP Location: Left Arm, Patient Position: Sitting, Cuff Size: Normal)   Pulse (!) 105   Temp 97.7 F (36.5 C) (Oral)   Ht 5\' 1"  (  1.549 m)   Wt 125 lb 1.9 oz (56.8 kg)   LMP  (LMP Unknown)   SpO2 93%   BMI 23.64 kg/m  Gen: Afebrile. No acute distress. Nontoxic in appearance, well-developed, well-nourished,  in a wheelchair, pleasant Caucasian female HENT: AT. [place].  MMM Eyes:Pupils Equal Round Reactive to light, Extraocular movements intact,  Conjunctiva without redness, discharge or icterus. Neck/lymp/endocrine: Supple,no lymphadenopathy CV: RRR 1/6 SM, no edema, +2/4 P posterior tibialis pulses. no carotid bruits. No JVD. Chest: CTAB, no wheeze, rhonchi or crackles.  Abd: Soft.NTND. BS present. Skin: Warm and well-perfused. Skin intact. Neuro/Msk:  Normal gait. PERLA. EOMi. Alert. Oriented x3.  Psych: Normal affect, dress and demeanor. Normal speech. Normal thought content and judgment.  Assessment/plan: [name] is a 57 y.o. female present for establishment of care. Urticaria/Anxiety - Possible etiology secondary to pathological or emotional distress after stroke. She tried to discontinue her medications and nothing caused resolution of her urticaria.  - Continue Pepcid 20 mg twice a day, this was refilled for her today. - Start daily Allegra. Prescribed very low dose of Vistaril to be used daily at bedtime. Sedation precautions given. - Start Lexapro low-dose 10 mg  daily. DC Prozac. - hydrOXYzine (ATARAX/VISTARIL) 10 MG tablet; Take 1-2 tablets (10-20 mg total) by mouth at bedtime.  Dispense: 30 tablet; Refill: 0 - escitalopram (LEXAPRO) 10 MG tablet; Take 1 tablet (10 mg total) by mouth daily.  Dispense: 30 tablet; Refill: 0 - Follow-up 4 weeks  Hypokalemia - mildly low on DC, rpt panel today - Basic Metabolic Panel (BMET)  Influenza vaccine administered - Flu Vaccine QUAD 6+ mos PF IM (Fluarix Quad PF)  Cerebrovascular accident (CVA) due to occlusion of left middle cerebral artery (HCC) Right hemiplegia (HCC)/Hyperlipidemia, unspecified hyperlipidemia type/former smoker - Congratulated her on smoking cessation. She hasn't had a cigarette since her stroke. - Restart aspirin 325 daily. - Lipitor caused diarrhea. Trial of rosuvastatin 20 mg daily (lower GI symptoms side effect profile) . Discussed the need of statin medication and its futility in stroke prevention. Otherwise patient to start this medication in one week, there for it's not started at the same time another new medications in the event side effects occur. - Patient is established with neurology and cardiology. Follow-up routinely as advised with her specialist. - Referral to neurological rehabilitation outpatient completed today--> Ambulatory referral to Physical Therapy/Ambulatory referral to Occupational Therapy  Return in about 4 weeks (around 11/10/2017) for hives/stroke.   Greater than 45 minutes was spent with patient, greater than 50% of that time was spent face-to-face with patient counseling, coordinating care and extensive electronic medical record search for pertinent history.  Note is dictated utilizing voice recognition software. Although note has been proof read prior to signing, occasional typographical errors still can be missed. If any questions arise, please do not hesitate to call for verification.  Electronically signed by: Howard Pouch, DO [facility]

## 2017-10-16 ENCOUNTER — Telehealth: Payer: Self-pay | Admitting: *Deleted

## 2017-10-16 NOTE — Telephone Encounter (Signed)
Spoke with patient's husband he states patient 's muscle cramps have resolved she is only taking the lexapro she will continue that and start crestor on Friday as directed. He will let us know if muscle cramps reoccur.

## 2017-10-16 NOTE — Telephone Encounter (Signed)
Did she start the rosuvastatin? She was asked to wait one week before starting (so multiple new meds not started at same time).  - Are the muscle cramps on stroke side only? Cramps are common in stroke pts and she may need medication to help with this problem.  - Other medications prescribed do not typically cause muscle cramping. - If she started the rosuvastatin, have her stop. - If she did not start the rosuvastatin, have her stop the Lexapro and see if there is any improvement.  - If she stops the Lexapro, and finds that she needs replacement, she can restart the Prozac if she desires. - Make certain maintaining hydration.

## 2017-10-16 NOTE — Telephone Encounter (Signed)
Patients husband states patient is having muscle cramping since starting the medications prescribed on last Friday. Please advise.

## 2017-10-23 ENCOUNTER — Ambulatory Visit (HOSPITAL_COMMUNITY): Admission: RE | Admit: 2017-10-23 | Payer: BLUE CROSS/BLUE SHIELD | Source: Ambulatory Visit

## 2017-10-25 ENCOUNTER — Ambulatory Visit: Payer: BLUE CROSS/BLUE SHIELD | Attending: Family Medicine | Admitting: Physical Therapy

## 2017-10-25 ENCOUNTER — Encounter: Payer: Self-pay | Admitting: Physical Therapy

## 2017-10-25 ENCOUNTER — Other Ambulatory Visit: Payer: Self-pay

## 2017-10-25 ENCOUNTER — Ambulatory Visit: Payer: BLUE CROSS/BLUE SHIELD | Admitting: Occupational Therapy

## 2017-10-25 DIAGNOSIS — R2689 Other abnormalities of gait and mobility: Secondary | ICD-10-CM | POA: Diagnosis not present

## 2017-10-25 DIAGNOSIS — I69351 Hemiplegia and hemiparesis following cerebral infarction affecting right dominant side: Secondary | ICD-10-CM | POA: Diagnosis not present

## 2017-10-25 DIAGNOSIS — M6281 Muscle weakness (generalized): Secondary | ICD-10-CM | POA: Diagnosis not present

## 2017-10-25 DIAGNOSIS — R2681 Unsteadiness on feet: Secondary | ICD-10-CM

## 2017-10-25 DIAGNOSIS — R293 Abnormal posture: Secondary | ICD-10-CM | POA: Diagnosis present

## 2017-10-25 DIAGNOSIS — R278 Other lack of coordination: Secondary | ICD-10-CM

## 2017-10-25 DIAGNOSIS — R600 Localized edema: Secondary | ICD-10-CM | POA: Diagnosis present

## 2017-10-25 NOTE — Therapy (Signed)
[facility] [address] [place], [place], [address] Phone: [phone]   Fax:  [phone]  Occupational Therapy Evaluation  Patient Details  Name: [name] MRN: [id number] Date of Birth: [birth year] Referring Provider: Dr. Howard Pouch   Encounter Date: 10/25/2056  OT End of Session - 10/25/17 1236    Visit Number  1    Number of Visits  25    Date for OT Re-Evaluation  01/17/57    Authorization Type  BC/BS    Authorization - Visit Number  1    Authorization - Number of Visits  24    OT Start Time  1140    OT Stop Time  1230    OT Time Calculation (min)  50 min    Activity Tolerance  Patient tolerated treatment well    Behavior During Therapy  [facility] for tasks assessed/performed       Past Medical History:  Diagnosis Date  . Hay fever   . Stroke ([place]) 08/23/2017   right sided deficits.     Past Surgical History:  Procedure Laterality Date  . IR ANGIO INTRA EXTRACRAN SEL COM CAROTID INNOMINATE UNI R MOD SED  08/21/2017  . IR ANGIO VERTEBRAL SEL SUBCLAVIAN INNOMINATE UNI R MOD SED  08/21/2017  . IR PERCUTANEOUS ART THROMBECTOMY/INFUSION INTRACRANIAL INC DIAG ANGIO  08/21/2017  . LOOP RECORDER INSERTION N/A 08/28/2017   Procedure: LOOP RECORDER INSERTION;  Surgeon: Constance Haw, MD;  Location: [facility];  Service: Cardiovascular;  Laterality: N/A;  . RADIOLOGY WITH ANESTHESIA N/A 08/21/2017   Procedure: RADIOLOGY WITH ANESTHESIA;  Surgeon: Luanne Bras, MD;  Location: [facility];  Service: Radiology;  Laterality: N/A;  . TEE WITHOUT CARDIOVERSION N/A 08/28/2017   Procedure: TRANSESOPHAGEAL ECHOCARDIOGRAM (TEE);  Surgeon: Fay Records, MD;  Location: [facility];  Service: Cardiovascular;  Laterality: N/A;    There were no vitals filed for this visit.  Subjective Assessment - 10/25/17 1143    Limitations  *loop recorder, fall risk, no driving    Patient Stated Goals  Get my arm  working    Currently in Pain?  No/denies        [facility] OT Assessment - 10/25/17 0001      Assessment   Medical Diagnosis  Lt MCA CVA    Referring Provider  Dr. Howard Pouch    Onset Date/Surgical Date  08/21/17    Hand Dominance  Right    Next MD Visit  -- next Monday     Prior Therapy  CIR 08/28/17 - 09/14/17, HHOT/PT      Precautions   Precautions  Fall    Precaution Comments  FALL RISK, no driving    Required Braces or Orthoses  -- AFO      Restrictions   Weight Bearing Restrictions  No      Balance Screen   Has the patient fallen in the past 6 months  -- See P.T. 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 bra)    Lower Body Dressing  Modified independent using shoe buttons and elastic pants    Toilet Transfer  Modified independent    Toileting -  Hygiene  Modified Independent    Tub/Shower Transfer  Supervision/safety    ADL comments  Assist to dry during bathing      IADL   Shopping  Completely unable to shop hasn't attempted yet    Light Housekeeping  -- only picks up as able, unload dishwasher, put away groceries    Meal Prep  Able to complete simple cold meal and snack prep;Able to complete simple warm meal prep but needs help transporting items    Medication Management  Is  responsible for taking medication in correct dosages at correct time    Financial Management  -- needs assist from husband      Mobility   Mobility Status  Needs assist    Mobility Status Comments  walks w/ quad cane for shorter distances, w/c for longer distances/community      Written Expression   Dominant Hand  Right    Handwriting  -- unable w/ Rt hand, writing name w/ Lt hand      Vision - History   Baseline Vision  Wears glasses only for reading    Additional Comments  Denies changes. Pt reports she had triple vision initially from meds but resolved      Cognition   Overall Cognitive Status  Within Functional Limits for tasks assessed    Cognition Comments  Pt denies change - O.T. did not formally assess      Observation/Other Assessments   Observations  Rt dominant side hemiplegia, only 1/2 finger width subluxation RT shoulder, walks w/ quad cane, edema Rt hand.       Sensation   Light Touch  Appears Intact      Coordination   Gross Motor Movements are Fluid and Coordinated  No    Fine Motor Movements are Fluid and Coordinated  No    Coordination  no functional use Rt hand      Edema   Edema  moderate Rt hand      Tone   Assessment Location  Right Upper Extremity      ROM / Strength   AROM / PROM / Strength  AROM;PROM      AROM   Overall AROM Comments  Scapula elevation and retraction approx 50-75%. No active ROM against gravity RUE except approx. 15% finger flexion. Pt does have some activation at shoulder and elbow with closed chain activities and AA/ROM       PROM   Overall PROM Comments  Rt sh. flexion to approx. 120*, abd to approx. 130* before pain. Full P/ROM distally with tightness in wrist extension      RUE Tone   RUE Tone  Mild;Hypertonic;Hypotonic      RUE Tone   Hypertonic Details  mild w/ supination, wrist ext, finger extension    Hypotonic Details  at shoulder  OT Education - 10/25/17 1237    Education  provided  Yes    Education Details  OT POC, rationale behind symptoms    Person(s) Educated  Patient;Spouse    Methods  Explanation    Comprehension  Verbalized understanding       OT Short Term Goals - 10/25/17 1242      OT SHORT TERM GOAL #1   Title  Independent with initial HEP     Time  6    Period  Weeks    Status  New    Target Date  12/06/17      OT SHORT TERM GOAL #2   Title  Pt to demo 25 degrees shoulder flexion in prep for low level reaching    Time  6    Period  Weeks    Status  New      OT SHORT TERM GOAL #3   Title  Pt to demo 50% finger flexion and 25% finger extension in prep for functional grasp/release    Time  6    Period  Weeks    Status  New      OT SHORT TERM GOAL #4   Title  Pt/family to verbalize understanding with edema management strategies Rt hand     Time  6    Period  Weeks    Status  New      OT SHORT TERM GOAL #5   Title  Pt to verbalize understanding with A/E needs to increased independence with ADLS/IADLS    Time  6    Period  Weeks    Status  New        OT Long Term Goals - 10/25/17 1245      OT LONG TERM GOAL #1   Title  Independent with updated HEP    Time  12    Period  Weeks    Status  New    Target Date  01/17/18      OT LONG TERM GOAL #2   Title  Pt to demo functional use of Rt hand as evidenced by performing 8 blocks on Box & Blocks test    Time  12    Period  Weeks    Status  New      OT LONG TERM GOAL #3   Title  Pt to demo 40 degrees shoulder flexion RUE    Time  12    Period  Weeks    Status  New      OT LONG TERM GOAL #4   Title  Pt to demo 75% finger flexion and 50% finger extension Rt hand    Time  12    Period  Weeks    Status  New      OT LONG TERM GOAL #5   Title  Pt to cook meal at mod I level using A/E PRN    Time  12    Period  Weeks    Status  New      Long Term Additional Goals   Additional Long Term Goals  Yes      OT LONG TERM GOAL #6   Title  Pt to perform light housework  (laundry, cleaning) at mod I level    Time  12    Period  Weeks    Status  New            Plan - 10/25/17 1237    Clinical Impression Statement  Pt is  a 57 y.o. female who presents to outpatient rehab s/p Lt MCA CVA with Rt dominant side hemiplegia. Pt with no functional use of RUE at this time, but does demo some RUE activation in closed chain and AA/ROM tasks. Pt also has loop recorder     Occupational Profile and client history currently impacting functional performance  No significant PMH, but current deficits from stroke are preventing patient to perform IADLS, caring for elderly mother, and social participation    Occupational performance deficits (Please refer to evaluation for details):  ADL's;IADL's;Social Participation;Other taking care of mother who is legally blind    Rehab Potential  Good    OT Frequency  2x / week    OT Duration  12 weeks    OT Treatment/Interventions  Self-care/ADL training;Moist Heat;Fluidtherapy;DME and/or AE instruction;Splinting;Aquatic Therapy;Therapeutic activities;Compression bandaging;Other (comment);Therapeutic exercise;Neuromuscular education;Functional Mobility Training;Passive range of motion;Visual/perceptual remediation/compensation;Manual Therapy;Electrical Stimulation;Energy conservation;Patient/family education edema management    Plan  NMR, Initiate HEP     Clinical Decision Making  Several treatment options, min-mod task modification necessary    Consulted and Agree with Plan of Care  Patient;Family member/caregiver    Family Member Consulted  husband       Patient will benefit from skilled therapeutic intervention in order to improve the following deficits and impairments:  Decreased coordination, Decreased range of motion, Difficulty walking, Decreased endurance, Impaired tone, Decreased activity tolerance, Decreased knowledge of precautions, Decreased balance, Decreased knowledge of use of DME, Impaired UE functional use, Pain,  Decreased mobility, Decreased strength  Visit Diagnosis: Hemiplegia and hemiparesis following cerebral infarction affecting right dominant side ([place]) - Plan: Ot plan of care cert/re-cert  Muscle weakness (generalized) - Plan: Ot plan of care cert/re-cert  Other lack of coordination - Plan: Ot plan of care cert/re-cert  Abnormal posture - Plan: Ot plan of care cert/re-cert  Localized edema - Plan: Ot plan of care cert/re-cert  Unsteadiness on feet - Plan: Ot plan of care cert/re-cert    Problem List Patient Active Problem List   Diagnosis Date Noted  . Stress and adjustment reaction 10/13/2017  . Urticaria 10/13/2017  . Former smoker 10/13/2017  . Hypokalemia   . Hypoalbuminemia due to protein-calorie malnutrition ([place])   . Leucocytosis   . Allergic contact dermatitis due to adhesives   . 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 Right hemiplegia ([place])   . Dysphagia, post-stroke   . Hyperlipidemia     Carey Bullocks, OTR/L 10/25/2017, 1:05 PM  [facility] [address] [place], [place], [address] Phone: [phone]   Fax:  [phone]  Name: TOMMI CREPEAU MRN: [id number] Date of Birth: 02-05-1961

## 2017-10-25 NOTE — Therapy (Signed)
[facility] [address] [place], [place], [address] Phone: [phone]   Fax:  [phone]  Physical Therapy Evaluation  Patient Details  Name: [name] MRN: [id number] Date of Birth: 09-09-1961 Referring Provider: Dr. Howard Pouch   Encounter Date: 10/25/2017  PT End of Session - 10/25/17 1314    Visit Number  1    Number of Visits  16    Date for PT Re-Evaluation  12/20/17    Authorization Type  BCBS    PT Start Time  1232    PT Stop Time  1310    PT Time Calculation (min)  38 min    Equipment Utilized During Treatment  Gait belt    Activity Tolerance  Patient tolerated treatment well    Behavior During Therapy  WFL for tasks assessed/performed       Past Medical History:  Diagnosis Date  . Hay fever   . Stroke ([place]) 08/23/2017   right sided deficits.     Past Surgical History:  Procedure Laterality Date  . IR ANGIO INTRA EXTRACRAN SEL COM CAROTID INNOMINATE UNI R MOD SED  08/21/2017  . IR ANGIO VERTEBRAL SEL SUBCLAVIAN INNOMINATE UNI R MOD SED  08/21/2017  . IR PERCUTANEOUS ART THROMBECTOMY/INFUSION INTRACRANIAL INC DIAG ANGIO  08/21/2017  . LOOP RECORDER INSERTION N/A 08/28/2017   Procedure: LOOP RECORDER INSERTION;  Surgeon: Constance Haw, MD;  Location: [facility];  Service: Cardiovascular;  Laterality: N/A;  . RADIOLOGY WITH ANESTHESIA N/A 08/21/2017   Procedure: RADIOLOGY WITH ANESTHESIA;  Surgeon: Luanne Bras, MD;  Location: [facility];  Service: Radiology;  Laterality: N/A;  . TEE WITHOUT CARDIOVERSION N/A 08/28/2017   Procedure: TRANSESOPHAGEAL ECHOCARDIOGRAM (TEE);  Surgeon: Fay Records, MD;  Location: [facility];  Service: Cardiovascular;  Laterality: N/A;    There were no vitals filed for this visit.   Subjective Assessment - 10/25/17 1234    Subjective  Pt is a 57 y/o female who presents to [facility] s/p Lt CVA resulting in Rt hemiparesis on 08/21/17.  Pt hospitalized  including CIR from 08/21/17-09/14/17.  Pt today reports amb with SBQC and AFO.    Limitations  Walking    Patient Stated Goals  "make my legs look more straight," improve mobility and balance, work on stairs    Currently in Pain?  No/denies         [facility] PT Assessment - 10/25/17 1238      Assessment   Medical Diagnosis  Lt MCA CVA    Referring Provider  Dr. Howard Pouch    Onset Date/Surgical Date  08/21/17    Hand Dominance  Right    Next MD Visit  10/30/17    Prior Therapy  CIR 08/28/17 - 09/14/17, HHOT/PT      Precautions   Precautions  Fall    Precaution Comments  FALL RISK, no driving      Restrictions   Weight Bearing Restrictions  No      Balance Screen   Has the patient fallen in the past 6 months  No    Has the patient had a decrease in activity level because of a fear of falling?   Yes    Is the patient reluctant to leave their home because of a fear of falling?   No      Home Environment   Living Environment  Private residence    Living Arrangements  Spouse/significant other    Available Help at  Discharge  Family;Available 24 hours/day    Type of [place] to enter    Entrance Stairs-Number of Steps  2    Entrance Stairs-Rails  None    Home Layout  One level    Additional Comments  lives on a farm      Prior Function   Level of [place]  Works at home    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), and working on the farm (tobacco and cattle)    Leisure  play with the Economist; Psychologist, occupational at [employer]; sell antiques      Cognition   Overall Cognitive Status  Within Functional Limits for tasks assessed      ROM / Strength   AROM / PROM / Strength  Strength      Strength   Strength Assessment Site  Hip;Knee;Ankle    Right/Left Hip  Right;Left    Right Hip Flexion  3/5    Left Hip Flexion  4/5    Right/Left Knee  Right;Left    Right Knee Flexion  3+/5    Right Knee Extension   3/5    Left Knee Flexion  5/5    Left Knee Extension  5/5    Right/Left Ankle  Right;Left    Right Ankle Dorsiflexion  3-/5    Left Ankle Dorsiflexion  5/5      Ambulation/Gait   Ambulation/Gait  Yes    Ambulation/Gait Assistance  5: Supervision    Ambulation Distance (Feet)  150 Feet    Assistive device  Small based quad cane    Gait Pattern  Step-to pattern;Decreased stance time - right;Decreased step length - left;Decreased dorsiflexion - right bil genu varus    Ambulation Surface  Level;Indoor    Gait velocity  0.76 ft/sec 34m: 43 sec      Standardized Balance Assessment   Standardized Balance Assessment  Berg Balance Test;Timed Up and Go Test      Berg Balance Test   Sit to Stand  Able to stand without using hands and stabilize independently    Standing Unsupported  Able to stand safely 2 minutes    Sitting with Back Unsupported but Feet Supported on Floor or Stool  Able to sit safely and securely 2 minutes    Stand to Sit  Sits safely with minimal use of hands    Transfers  Able to transfer safely, minor use of hands    Standing Unsupported with Eyes Closed  Able to stand 10 seconds with supervision    Standing Ubsupported with Feet Together  Needs help to attain position but able to stand for 30 seconds with feet together    From Standing, Reach Forward with Outstretched Arm  Can reach forward >12 cm safely (5")    From Standing Position, Pick up Object from Floor  Able to pick up shoe, needs supervision    From Standing Position, Turn to Look Behind Over each Shoulder  Needs supervision when turning    Turn 360 Degrees  Needs close supervision or verbal cueing    Standing Unsupported, Alternately Place Feet on Step/Stool  Able to complete >2 steps/needs minimal assist    Standing Unsupported, One Foot in Front  Able to take small step independently and hold 30 seconds    Standing on One Leg  Able to lift leg independently and hold 5-10 seconds LLE only; unable RLE  Total  Score  38      Timed Up and Go Test   Normal TUG (seconds)  33.19    TUG Comments  with SBQC without AFO 36.47 sec with SBQC and AFO             Objective measurements completed on examination: See above findings.                PT Short Term Goals - 10/25/17 1417      PT SHORT TERM GOAL #1   Title  verbalize understanding of CVA risk factors/warning signs    Status  New    Target Date  11/22/17      PT SHORT TERM GOAL #2   Title  amb > 350' with LRAD on indoor/paved outdoor surfaces modified independent for improved function    Status  New    Target Date  11/22/17      PT SHORT TERM GOAL #3   Title  improve timed up and go to < 25 sec with LRAD for improved mobility    Status  New    Target Date  11/22/17      PT SHORT TERM GOAL #4   Title  improve gait velocity to at least 1.0 ft/sec for improved mobility    Status  New    Target Date  11/22/17        PT Long Term Goals - 10/25/17 1429      PT LONG TERM GOAL #1   Title  independent with HEP    Status  New    Target Date  12/20/17      PT LONG TERM GOAL #2   Title  improve BERG balance score to >/= 46/56 for improved balance and decreased fall risk    Status  New    Target Date  12/20/17      PT LONG TERM GOAL #3   Title  improve timed up and go to < 20 sec for improved mobility    Status  New    Target Date  12/20/17      PT LONG TERM GOAL #4   Title  improve gait velocity to > 1.8 ft/sec for decreased fall risk    Status  New    Target Date  12/20/17      PT LONG TERM GOAL #5   Title  amb > 500' on various indoor/outdoor surfaces modified independent with LRAD for improved ability to help on family farm    Status  New    Target Date  12/20/17             Plan - 10/25/17 1408    Clinical Impression Statement  Pt is a 57 y/o female who presents to [facility] s/p Lt CVA with Rt hemiparesis.  Pt demonstrates decreased strength, balance and gait abnormalities affecting safe, functional  mobility.  Pt also with bil knee OA affecting mobility and standing tolerance.  Pt will benefit from PT to address deficits listed.    History and Personal Factors relevant to plan of care:  HLD, HTN, OA bil knees    Clinical Presentation  Evolving    Clinical Presentation due to:  fall risk, at risk for CVA    Clinical Decision Making  Moderate    Rehab Potential  Good    PT Frequency  2x / week    PT Duration  8 weeks    PT Treatment/Interventions  ADLs/Self Care Home Management;Cryotherapy;Electrical Stimulation;Moist Heat;Therapeutic  exercise;Therapeutic activities;Functional mobility training;Stair training;Gait training;DME Instruction;Neuromuscular re-education;Balance training;Patient/family education;Orthotic Fit/Training;Manual techniques;Vestibular;Taping    PT Next Visit Plan  establish HEP for RLE strengthening and balance; amb with/without AFO; try gait with SPC    Consulted and Agree with Plan of Care  Patient;Family member/caregiver    Family Member Consulted  husband       Patient will benefit from skilled therapeutic intervention in order to improve the following deficits and impairments:  Abnormal gait, Decreased strength, Decreased balance, Difficulty walking, Decreased mobility, Impaired perceived functional ability  Visit Diagnosis: Hemiplegia and hemiparesis following cerebral infarction affecting right dominant side ([facility]) - Plan: PT plan of care cert/re-cert  Other abnormalities of gait and mobility - Plan: PT plan of care cert/re-cert  Unsteadiness on feet - Plan: PT plan of care cert/re-cert  Muscle weakness (generalized) - Plan: PT plan of care cert/re-cert     Problem List Patient Active Problem List   Diagnosis Date Noted  . Stress and adjustment reaction 10/13/2017  . Urticaria 10/13/2017  . Former smoker 10/13/2017  . Hypokalemia   . Hypoalbuminemia due to protein-calorie malnutrition ([place])   . Leucocytosis   . Allergic contact dermatitis due to  adhesives   . Cerebrovascular accident (CVA) due to occlusion of left middle cerebral artery ([place])   . Right hemiplegia ([place])   . Dysphagia, post-stroke   . Hyperlipidemia       Laureen Abrahams, PT, DPT 10/25/17 2:36 PM    [facility] [address] [place], [place], [address] Phone: [phone]   Fax:  [phone]  Name: [name] MRN: [id number] Date of Birth: Mar 29, 1961

## 2017-10-27 ENCOUNTER — Ambulatory Visit (INDEPENDENT_AMBULATORY_CARE_PROVIDER_SITE_OTHER): Payer: BLUE CROSS/BLUE SHIELD | Admitting: *Deleted

## 2017-10-27 DIAGNOSIS — I63512 Cerebral infarction due to unspecified occlusion or stenosis of left middle cerebral artery: Secondary | ICD-10-CM

## 2017-10-27 DIAGNOSIS — I639 Cerebral infarction, unspecified: Secondary | ICD-10-CM | POA: Diagnosis not present

## 2017-10-27 NOTE — Progress Notes (Signed)
[facility] Summary Report / Loop Recorder 

## 2017-10-30 ENCOUNTER — Ambulatory Visit (HOSPITAL_COMMUNITY)
Admission: RE | Admit: 2017-10-30 | Discharge: 2017-10-30 | Disposition: A | Discharge status: Home / Self Care | Payer: BLUE CROSS/BLUE SHIELD | Source: Ambulatory Visit | Attending: Interventional Radiology | Admitting: Interventional Radiology

## 2017-10-30 ENCOUNTER — Ambulatory Visit (INDEPENDENT_AMBULATORY_CARE_PROVIDER_SITE_OTHER): Payer: Self-pay | Admitting: *Deleted

## 2017-10-30 DIAGNOSIS — I639 Cerebral infarction, unspecified: Secondary | ICD-10-CM

## 2017-10-30 DIAGNOSIS — I63512 Cerebral infarction due to unspecified occlusion or stenosis of left middle cerebral artery: Secondary | ICD-10-CM

## 2017-10-30 DIAGNOSIS — I6522 Occlusion and stenosis of left carotid artery: Secondary | ICD-10-CM | POA: Diagnosis not present

## 2017-10-30 HISTORY — PX: IR RADIOLOGIST EVAL & MGMT: IMG5224

## 2017-10-30 LAB — CUP PACEART INCLINIC DEVICE CHECK
Implantable Pulse Generator Implant Date: 20181203
MDC IDC SESS DTM: 20190204154531

## 2017-10-30 NOTE — Progress Notes (Signed)
Loop check in clinic for cryptogenic stroke. Wound well healed (DOI: 08/28/17). Battery status: good. R-waves 0.23mV. No episodes. Brady and pause detection remain on d/t history of syncope per patient. Monthly summary reports and ROV with WC as needed.

## 2017-10-31 LAB — CUP PACEART REMOTE DEVICE CHECK
Implantable Pulse Generator Implant Date: 20181203
MDC IDC SESS DTM: 20190201190843

## 2017-11-01 ENCOUNTER — Ambulatory Visit: Payer: BLUE CROSS/BLUE SHIELD | Attending: Family Medicine | Admitting: Occupational Therapy

## 2017-11-01 ENCOUNTER — Encounter: Payer: Self-pay | Admitting: Physical Therapy

## 2017-11-01 ENCOUNTER — Encounter: Payer: Self-pay | Admitting: Occupational Therapy

## 2017-11-01 ENCOUNTER — Other Ambulatory Visit (HOSPITAL_COMMUNITY): Payer: Self-pay | Admitting: Interventional Radiology

## 2017-11-01 ENCOUNTER — Ambulatory Visit: Payer: BLUE CROSS/BLUE SHIELD | Admitting: Physical Therapy

## 2017-11-01 DIAGNOSIS — M6281 Muscle weakness (generalized): Secondary | ICD-10-CM

## 2017-11-01 DIAGNOSIS — I69351 Hemiplegia and hemiparesis following cerebral infarction affecting right dominant side: Secondary | ICD-10-CM

## 2017-11-01 DIAGNOSIS — R600 Localized edema: Secondary | ICD-10-CM | POA: Insufficient documentation

## 2017-11-01 DIAGNOSIS — R293 Abnormal posture: Secondary | ICD-10-CM

## 2017-11-01 DIAGNOSIS — R2681 Unsteadiness on feet: Secondary | ICD-10-CM | POA: Insufficient documentation

## 2017-11-01 DIAGNOSIS — R2689 Other abnormalities of gait and mobility: Secondary | ICD-10-CM

## 2017-11-01 DIAGNOSIS — R278 Other lack of coordination: Secondary | ICD-10-CM | POA: Insufficient documentation

## 2017-11-01 NOTE — Patient Instructions (Addendum)
Lateral Weight Shift: Upper Trunk Leading   Sit with feet flat on floor. Place right hand beside you on he sofa, press gently  through right hand.Repeat _5-10___ times per session. Do __2__ sessions per day.  Laying on your back, clasp hands together raise arms to shoulder height, 10 reps 2 x day  Seated clasp hands together and reach towards your right ankle, 10 reps 2 x day  Seated -Place your hand on the crook of your cane push cane forwards and backwards, 10 reps 2 x dayPROM: Finger MP Joints   Use your left hand to help right hand perform the following : turn your right forearm up/ down 10 x                                                                                               Bend wrist back into extension 10 x                                                                                                Open and close hand 10x

## 2017-11-01 NOTE — Therapy (Signed)
[facility] [address] [place], [place], [address] Phone: [phone]   Fax:  [phone]  Occupational Therapy Treatment  Patient Details  Name: [name] MRN: [id number] Date of Birth: 26-Jul-1961 Referring Provider: Dr. Howard Pouch   Encounter Date: 11/01/2017  OT End of Session - 11/01/17 1730    Visit Number  2    Number of Visits  25    Date for OT Re-Evaluation  01/17/18    Authorization Type  BC/BS    Authorization - Visit Number  2    Authorization - Number of Visits  24    OT Start Time  0806    OT Stop Time  0845    OT Time Calculation (min)  39 min    Activity Tolerance  Patient tolerated treatment well    Behavior During Therapy  [facility] for tasks assessed/performed       Past Medical History:  Diagnosis Date  . Hay fever   . Stroke ([place]) 08/23/2017   right sided deficits.     Past Surgical History:  Procedure Laterality Date  . IR ANGIO INTRA EXTRACRAN SEL COM CAROTID INNOMINATE UNI R MOD SED  08/21/2017  . IR ANGIO VERTEBRAL SEL SUBCLAVIAN INNOMINATE UNI R MOD SED  08/21/2017  . IR PERCUTANEOUS ART THROMBECTOMY/INFUSION INTRACRANIAL INC DIAG ANGIO  08/21/2017  . IR RADIOLOGIST EVAL & MGMT  10/30/2017  . LOOP RECORDER INSERTION N/A 08/28/2017   Procedure: LOOP RECORDER INSERTION;  Surgeon: Constance Haw, MD;  Location: [facility];  Service: Cardiovascular;  Laterality: N/A;  . RADIOLOGY WITH ANESTHESIA N/A 08/21/2017   Procedure: RADIOLOGY WITH ANESTHESIA;  Surgeon: Luanne Bras, MD;  Location: [facility];  Service: Radiology;  Laterality: N/A;  . TEE WITHOUT CARDIOVERSION N/A 08/28/2017   Procedure: TRANSESOPHAGEAL ECHOCARDIOGRAM (TEE);  Surgeon: Fay Records, MD;  Location: [facility];  Service: Cardiovascular;  Laterality: N/A;    There were no vitals filed for this visit.  Subjective Assessment - 11/01/17 0807    Subjective   pt reports knee pain    Patient Stated  Goals  Get my arm working    Currently in Pain?  Yes    Pain Score  5     Pain Location  Knee    Pain Orientation  Right    Pain Descriptors / Indicators  Aching    Pain Type  Chronic pain    Pain Onset  More than a month ago    Pain Frequency  Intermittent    Aggravating Factors   walking    Pain Relieving Factors  resting    Multiple Pain Sites  No                           OT Education - 11/01/17 1729    Education provided  Yes    Education Details  HEP    Person(s) Educated  Patient    Methods  Explanation;Demonstration;Handout    Comprehension  Verbalized understanding;Returned demonstration;Verbal cues required       OT Short Term Goals - 10/25/17 1242      OT SHORT TERM GOAL #1   Title  Independent with initial HEP     Time  6    Period  Weeks    Status  New    Target Date  12/06/17      OT SHORT TERM GOAL #2   Title  Pt to  Difficulty walking, Decreased endurance, Impaired tone, Decreased activity tolerance, Decreased knowledge of precautions, Decreased balance, Decreased knowledge of use of DME, Impaired UE functional use, Pain, Decreased mobility, Decreased strength  Visit Diagnosis: Hemiplegia and hemiparesis following cerebral infarction affecting right dominant side (HCC)  Muscle weakness (generalized)  Other lack of  coordination    Problem List Patient Active Problem List   Diagnosis Date Noted  . Stress and adjustment reaction 10/13/2017  . Urticaria 10/13/2017  . Former smoker 10/13/2017  . Hypokalemia   . Hypoalbuminemia due to protein-calorie malnutrition ([place])   . Leucocytosis   . Allergic contact dermatitis due to adhesives   . Cerebrovascular accident (CVA) due to occlusion of left middle cerebral artery ([place])   . Right hemiplegia ([place])   . Dysphagia, post-stroke   . Hyperlipidemia     Sharnetta Gielow 11/01/2017, 5:32 PM  [facility] [address] [place], [place], [address] Phone: [phone]   Fax:  [phone]  Name: ATHALIE NEWHARD MRN: [id number] Date of Birth: Mar 28, 1961  [facility] [address] [place], [place], [address] Phone: [phone]   Fax:  [phone]  Occupational Therapy Treatment  Patient Details  Name: [name] MRN: [id number] Date of Birth: 26-Jul-1961 Referring Provider: Dr. Howard Pouch   Encounter Date: 11/01/2017  OT End of Session - 11/01/17 1730    Visit Number  2    Number of Visits  25    Date for OT Re-Evaluation  01/17/18    Authorization Type  BC/BS    Authorization - Visit Number  2    Authorization - Number of Visits  24    OT Start Time  0806    OT Stop Time  0845    OT Time Calculation (min)  39 min    Activity Tolerance  Patient tolerated treatment well    Behavior During Therapy  [facility] for tasks assessed/performed       Past Medical History:  Diagnosis Date  . Hay fever   . Stroke ([place]) 08/23/2017   right sided deficits.     Past Surgical History:  Procedure Laterality Date  . IR ANGIO INTRA EXTRACRAN SEL COM CAROTID INNOMINATE UNI R MOD SED  08/21/2017  . IR ANGIO VERTEBRAL SEL SUBCLAVIAN INNOMINATE UNI R MOD SED  08/21/2017  . IR PERCUTANEOUS ART THROMBECTOMY/INFUSION INTRACRANIAL INC DIAG ANGIO  08/21/2017  . IR RADIOLOGIST EVAL & MGMT  10/30/2017  . LOOP RECORDER INSERTION N/A 08/28/2017   Procedure: LOOP RECORDER INSERTION;  Surgeon: Constance Haw, MD;  Location: [facility];  Service: Cardiovascular;  Laterality: N/A;  . RADIOLOGY WITH ANESTHESIA N/A 08/21/2017   Procedure: RADIOLOGY WITH ANESTHESIA;  Surgeon: Luanne Bras, MD;  Location: [facility];  Service: Radiology;  Laterality: N/A;  . TEE WITHOUT CARDIOVERSION N/A 08/28/2017   Procedure: TRANSESOPHAGEAL ECHOCARDIOGRAM (TEE);  Surgeon: Fay Records, MD;  Location: [facility];  Service: Cardiovascular;  Laterality: N/A;    There were no vitals filed for this visit.  Subjective Assessment - 11/01/17 0807    Subjective   pt reports knee pain    Patient Stated  Goals  Get my arm working    Currently in Pain?  Yes    Pain Score  5     Pain Location  Knee    Pain Orientation  Right    Pain Descriptors / Indicators  Aching    Pain Type  Chronic pain    Pain Onset  More than a month ago    Pain Frequency  Intermittent    Aggravating Factors   walking    Pain Relieving Factors  resting    Multiple Pain Sites  No                           OT Education - 11/01/17 1729    Education provided  Yes    Education Details  HEP    Person(s) Educated  Patient    Methods  Explanation;Demonstration;Handout    Comprehension  Verbalized understanding;Returned demonstration;Verbal cues required       OT Short Term Goals - 10/25/17 1242      OT SHORT TERM GOAL #1   Title  Independent with initial HEP     Time  6    Period  Weeks    Status  New    Target Date  12/06/17      OT SHORT TERM GOAL #2   Title  Pt to

## 2017-11-01 NOTE — Patient Instructions (Signed)
Bridging    Slowly raise buttocks from floor/bed, and hold for 5 seconds. Repeat _15___ times per set. Do _1___ sets per session. Do __2-3__ sessions per day.   Straight Leg Raise    Slowly raise locked right leg __6-8__ inches from floor. Repeat __15__ times per set. Do __1__ sets per session. Do __2-3__ sessions per day.   Abduction: Clam (Eccentric) - Side-Lying    Lie on left side with knees bent. Lift top knee, keeping feet together. Keep trunk steady. Slowly lower. _15_ reps per set, _2-3__ sets per day, _7__ days per week.   ANKLE: Dorsiflexion (Band)    Sit at edge of surface. Place band around top of foot. Keeping heel on floor, raise toes of banded foot. Hold _1-2__ seconds. Use ___red_____ band. _15__ reps per set, _2-3_ sets per day, _7__ days per week   ANKLE: Eversion, Unilateral (Band)    Place band around left foot. Keeping heel in place, raise toes of banded foot up and away from body. Do not move hip. Hold _1-2__ seconds. Use ___red_____ band. _15__ reps per set, _2-3__ sets per day, _7__ days per week   Functional Quadriceps: Sit to Stand    Sit on edge of chair, feet flat on floor. Stand upright, extending knees fully. Repeat __15__ times per set. Do __1__ sets per session. Do _2-3___ sessions per day.

## 2017-11-01 NOTE — Therapy (Signed)
[facility] [address] [place], [place], [address] Phone: [phone]   Fax:  [phone]  Physical Therapy Treatment  Patient Details  Name: [name] MRN: [id number] Date of Birth: 09-13-1961 Referring Provider: Dr. Howard Pouch   Encounter Date: 11/01/2017  PT End of Session - 11/01/17 0927    Visit Number  2    Number of Visits  16    Date for PT Re-Evaluation  12/20/17    Authorization Type  BCBS    PT Start Time  0847    PT Stop Time  0926    PT Time Calculation (min)  39 min    Equipment Utilized During Treatment  Gait belt    Activity Tolerance  Patient tolerated treatment well    Behavior During Therapy  [facility] for tasks assessed/performed       Past Medical History:  Diagnosis Date  . Hay fever   . Stroke ([place]) 08/23/2017   right sided deficits.     Past Surgical History:  Procedure Laterality Date  . IR ANGIO INTRA EXTRACRAN SEL COM CAROTID INNOMINATE UNI R MOD SED  08/21/2017  . IR ANGIO VERTEBRAL SEL SUBCLAVIAN INNOMINATE UNI R MOD SED  08/21/2017  . IR PERCUTANEOUS ART THROMBECTOMY/INFUSION INTRACRANIAL INC DIAG ANGIO  08/21/2017  . IR RADIOLOGIST EVAL & MGMT  10/30/2017  . LOOP RECORDER INSERTION N/A 08/28/2017   Procedure: LOOP RECORDER INSERTION;  Surgeon: Constance Haw, MD;  Location: [facility];  Service: Cardiovascular;  Laterality: N/A;  . RADIOLOGY WITH ANESTHESIA N/A 08/21/2017   Procedure: RADIOLOGY WITH ANESTHESIA;  Surgeon: Luanne Bras, MD;  Location: [facility];  Service: Radiology;  Laterality: N/A;  . TEE WITHOUT CARDIOVERSION N/A 08/28/2017   Procedure: TRANSESOPHAGEAL ECHOCARDIOGRAM (TEE);  Surgeon: Fay Records, MD;  Location: [facility];  Service: Cardiovascular;  Laterality: N/A;    There were no vitals filed for this visit.  Subjective Assessment - 11/01/17 0849    Subjective  went to IR yesterday; concerned about possible blood clot in RLE.  Foot  has decreased circulation.    Patient is accompained by:  Family member    Patient Stated Goals  "make my legs look more straight," improve mobility and balance, work on stairs    Currently in Pain?  No/denies                      [facility] Adult PT Treatment/Exercise - 11/01/17 0853      Ambulation/Gait   Ambulation/Gait  Yes    Ambulation/Gait Assistance  5: Supervision;4: Min assist    Ambulation Distance (Feet)  200 Feet    Assistive device  Small based quad cane    Gait Pattern  Step-to pattern;Decreased stance time - right;Decreased step length - left;Decreased dorsiflexion - right    Gait Comments  min A for glute med activation on Rt; increased Rt hip ER noted today      Exercises   Exercises  Knee/Hip      Knee/Hip Exercises: Seated   Other Seated Knee/Hip Exercises  Rt ankle dorsiflexion and eversion with red theraband x 15 reps each    Sit to Sand  15 reps;without UE support      Knee/Hip Exercises: Supine   Bridges  Both;15 reps    Straight Leg Raises  Right;15 reps      Knee/Hip Exercises: Sidelying   Clams  Rt; 15 reps  PT Education - 11/01/17 [phone]    Education provided  Yes    Education Details  HEP    Person(s) Educated  Patient;Spouse    Methods  Explanation;Demonstration;Handout    Comprehension  Verbalized understanding;Returned demonstration;Need further instruction       PT Short Term Goals - 10/25/17 1417      PT SHORT TERM GOAL #1   Title  verbalize understanding of CVA risk factors/warning signs    Status  New    Target Date  11/22/17      PT SHORT TERM GOAL #2   Title  amb > 350' with LRAD on indoor/paved outdoor surfaces modified independent for improved function    Status  New    Target Date  11/22/17      PT SHORT TERM GOAL #3   Title  improve timed up and go to < 25 sec with LRAD for improved mobility    Status  New    Target Date  11/22/17      PT SHORT TERM GOAL #4   Title  improve gait velocity to  at least 1.0 ft/sec for improved mobility    Status  New    Target Date  11/22/17        PT Long Term Goals - 10/25/17 1429      PT LONG TERM GOAL #1   Title  independent with HEP    Status  New    Target Date  12/20/17      PT LONG TERM GOAL #2   Title  improve BERG balance score to >/= 46/56 for improved balance and decreased fall risk    Status  New    Target Date  12/20/17      PT LONG TERM GOAL #3   Title  improve timed up and go to < 20 sec for improved mobility    Status  New    Target Date  12/20/17      PT LONG TERM GOAL #4   Title  improve gait velocity to > 1.8 ft/sec for decreased fall risk    Status  New    Target Date  12/20/17      PT LONG TERM GOAL #5   Title  amb > 500' on various indoor/outdoor surfaces modified independent with LRAD for 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  Will plan to address next session.  Initiated HEP for RLE strengthening.    PT Treatment/Interventions  ADLs/Self Care Home Management;Cryotherapy;Electrical Stimulation;Moist Heat;Therapeutic exercise;Therapeutic activities;Functional mobility training;Stair training;Gait training;DME Instruction;Neuromuscular re-education;Balance training;Patient/family education;Orthotic Fit/Training;Manual techniques;Vestibular;Taping    PT Next Visit Plan  review HEP for RLE strengthening and balance; amb with/without AFO; try gait with SPC; look at hip ir/er strength and flexibility (likley tight piriformis and weak internal rotators)    Consulted and Agree with Plan of Care  Patient;Family member/caregiver    Family Member Consulted  husband       Patient will benefit from skilled therapeutic intervention in order to improve the following deficits and impairments:  Abnormal  gait, Decreased strength, Decreased balance, Difficulty walking, Decreased mobility, Impaired perceived functional ability  Visit Diagnosis: Hemiplegia and hemiparesis following cerebral infarction  affecting right dominant side (HCC)  Muscle weakness (generalized)  Abnormal posture  Unsteadiness on feet  Other abnormalities of gait and mobility     Problem List Patient Active Problem List   Diagnosis Date Noted  . Stress and adjustment reaction 10/13/2017  . Urticaria 10/13/2017  . Former smoker 10/13/2017  . Hypokalemia   . Hypoalbuminemia due to protein-calorie malnutrition ([place])   . Leucocytosis   . Allergic contact dermatitis due to adhesives   . Cerebrovascular accident (CVA) due to occlusion of left middle cerebral artery ([place])   . Right hemiplegia ([place])   . Dysphagia, post-stroke   . Hyperlipidemia       Laureen Abrahams, PT, DPT 11/01/17 9:31 AM    [facility] [address] [place], [place], [address] Phone: [phone]   Fax:  [phone]  Name: ALOMA BOCH MRN: [id number] Date of Birth: 09-Mar-1961

## 2017-11-02 ENCOUNTER — Encounter
Payer: BLUE CROSS/BLUE SHIELD | Attending: Physical Medicine & Rehabilitation | Admitting: Physical Medicine & Rehabilitation

## 2017-11-02 ENCOUNTER — Ambulatory Visit: Payer: BLUE CROSS/BLUE SHIELD | Admitting: Occupational Therapy

## 2017-11-02 ENCOUNTER — Encounter: Payer: Self-pay | Admitting: Physical Medicine & Rehabilitation

## 2017-11-02 ENCOUNTER — Ambulatory Visit: Payer: BLUE CROSS/BLUE SHIELD | Admitting: Physical Therapy

## 2017-11-02 ENCOUNTER — Other Ambulatory Visit (HOSPITAL_COMMUNITY): Payer: Self-pay | Admitting: Interventional Radiology

## 2017-11-02 VITALS — BP 136/73 | HR 93

## 2017-11-02 DIAGNOSIS — R278 Other lack of coordination: Secondary | ICD-10-CM | POA: Diagnosis not present

## 2017-11-02 DIAGNOSIS — G8110 Spastic hemiplegia affecting unspecified side: Secondary | ICD-10-CM

## 2017-11-02 DIAGNOSIS — Z888 Allergy status to other drugs, medicaments and biological substances status: Secondary | ICD-10-CM | POA: Insufficient documentation

## 2017-11-02 DIAGNOSIS — Z833 Family history of diabetes mellitus: Secondary | ICD-10-CM | POA: Diagnosis not present

## 2017-11-02 DIAGNOSIS — Z8249 Family history of ischemic heart disease and other diseases of the circulatory system: Secondary | ICD-10-CM | POA: Insufficient documentation

## 2017-11-02 DIAGNOSIS — I69391 Dysphagia following cerebral infarction: Secondary | ICD-10-CM | POA: Diagnosis not present

## 2017-11-02 DIAGNOSIS — G8191 Hemiplegia, unspecified affecting right dominant side: Secondary | ICD-10-CM | POA: Diagnosis not present

## 2017-11-02 DIAGNOSIS — R2689 Other abnormalities of gait and mobility: Secondary | ICD-10-CM | POA: Diagnosis not present

## 2017-11-02 DIAGNOSIS — Z889 Allergy status to unspecified drugs, medicaments and biological substances status: Secondary | ICD-10-CM

## 2017-11-02 DIAGNOSIS — K5900 Constipation, unspecified: Secondary | ICD-10-CM | POA: Insufficient documentation

## 2017-11-02 DIAGNOSIS — R2681 Unsteadiness on feet: Secondary | ICD-10-CM | POA: Diagnosis not present

## 2017-11-02 DIAGNOSIS — R600 Localized edema: Secondary | ICD-10-CM | POA: Diagnosis not present

## 2017-11-02 DIAGNOSIS — I10 Essential (primary) hypertension: Secondary | ICD-10-CM | POA: Diagnosis not present

## 2017-11-02 DIAGNOSIS — I69351 Hemiplegia and hemiparesis following cerebral infarction affecting right dominant side: Secondary | ICD-10-CM

## 2017-11-02 DIAGNOSIS — Z87891 Personal history of nicotine dependence: Secondary | ICD-10-CM | POA: Insufficient documentation

## 2017-11-02 DIAGNOSIS — I63512 Cerebral infarction due to unspecified occlusion or stenosis of left middle cerebral artery: Secondary | ICD-10-CM | POA: Diagnosis not present

## 2017-11-02 DIAGNOSIS — R1310 Dysphagia, unspecified: Secondary | ICD-10-CM | POA: Diagnosis not present

## 2017-11-02 DIAGNOSIS — I639 Cerebral infarction, unspecified: Secondary | ICD-10-CM | POA: Diagnosis not present

## 2017-11-02 DIAGNOSIS — R293 Abnormal posture: Secondary | ICD-10-CM

## 2017-11-02 DIAGNOSIS — M6281 Muscle weakness (generalized): Secondary | ICD-10-CM

## 2017-11-02 MED ORDER — POLYETHYLENE GLYCOL 3350 17 GM/SCOOP PO POWD: 1.0000 | Freq: Once | ORAL | 1 refills | Status: AC

## 2017-11-02 MED ORDER — BACLOFEN 10 MG PO TABS: 5.0000 mg | ORAL_TABLET | Freq: Three times a day (TID) | ORAL | 1 refills | Status: DC

## 2017-11-02 NOTE — Therapy (Signed)
[facility] [address] [place], [place], [address] Phone: [phone]   Fax:  [phone]  Occupational Therapy Treatment  Patient Details  Name: [name] MRN: [id number] Date of Birth: September 22, 1961 Referring Provider: Dr. Howard Pouch   Encounter Date: 11/02/2017  OT End of Session - 11/02/17 1630    Visit Number  3    Number of Visits  25    Date for OT Re-Evaluation  01/17/18    Authorization Type  BC/BS    Authorization - Visit Number  3    Authorization - Number of Visits  24    OT Start Time  6962    OT Stop Time  1615    OT Time Calculation (min)  40 min    Activity Tolerance  Patient tolerated treatment well    Behavior During Therapy  [facility] for tasks assessed/performed       Past Medical History:  Diagnosis Date  . Hay fever   . Stroke ([place]) 08/23/2017   right sided deficits.     Past Surgical History:  Procedure Laterality Date  . IR ANGIO INTRA EXTRACRAN SEL COM CAROTID INNOMINATE UNI R MOD SED  08/21/2017  . IR ANGIO VERTEBRAL SEL SUBCLAVIAN INNOMINATE UNI R MOD SED  08/21/2017  . IR PERCUTANEOUS ART THROMBECTOMY/INFUSION INTRACRANIAL INC DIAG ANGIO  08/21/2017  . IR RADIOLOGIST EVAL & MGMT  10/30/2017  . LOOP RECORDER INSERTION N/A 08/28/2017   Procedure: LOOP RECORDER INSERTION;  Surgeon: Constance Haw, MD;  Location: [facility];  Service: Cardiovascular;  Laterality: N/A;  . RADIOLOGY WITH ANESTHESIA N/A 08/21/2017   Procedure: RADIOLOGY WITH ANESTHESIA;  Surgeon: Luanne Bras, MD;  Location: [facility];  Service: Radiology;  Laterality: N/A;  . TEE WITHOUT CARDIOVERSION N/A 08/28/2017   Procedure: TRANSESOPHAGEAL ECHOCARDIOGRAM (TEE);  Surgeon: Fay Records, MD;  Location: [facility];  Service: Cardiovascular;  Laterality: N/A;    There were no vitals filed for this visit.  Subjective Assessment - 11/02/17 1537    Limitations  *loop recorder, fall risk, no driving    Patient Stated Goals  Get my arm working    Currently in Pain?  Yes    Pain Score  5     Pain Location  Knee    Pain Orientation  Right    Pain Descriptors / Indicators  Aching    Pain Type  Chronic pain    Pain Onset  More than a month ago    Pain Frequency  Intermittent    Aggravating Factors   walking     Pain Relieving Factors  rest    Multiple Pain Sites  No              Treatment: P/rOM to digitis followed by AA/ROM finger flexion and passive extension. Weightbearing through edge of mat with increased control. Supine AA/ROM using UE ranger for closed chain chest press and shoulder flexion mod/ max facilitation for RUE. Unilateral AA/ROM elbow flexion/ extension in supine. Seated AA/ROM using UE ranger and pt's quad cane, min-mod facilitation.               OT Short Term Goals - 10/25/17 1242      OT SHORT TERM GOAL #1   Title  Independent with initial HEP     Time  6    Period  Weeks    Status  New    Target Date  12/06/17      OT  SHORT TERM GOAL #2   Title  Pt to demo 25 degrees shoulder flexion in prep for low level reaching    Time  6    Period  Weeks    Status  New      OT SHORT TERM GOAL #3   Title  Pt to demo 50% finger flexion and 25% finger extension in prep for functional grasp/release    Time  6    Period  Weeks    Status  New      OT SHORT TERM GOAL #4   Title  Pt/family to verbalize understanding with edema management strategies Rt hand     Time  6    Period  Weeks    Status  New      OT SHORT TERM GOAL #5   Title  Pt to verbalize understanding with A/E needs to increased independence with ADLS/IADLS    Time  6    Period  Weeks    Status  New        OT Long Term Goals - 10/25/17 1245      OT LONG TERM GOAL #1   Title  Independent with updated HEP    Time  12    Period  Weeks    Status  New    Target Date  01/17/18      OT LONG TERM GOAL #2   Title  Pt to demo functional use of Rt hand as evidenced by performing 8  blocks on [employer] test    Time  12    Period  Weeks    Status  New      OT LONG TERM GOAL #3   Title  Pt to demo 40 degrees shoulder flexion RUE    Time  12    Period  Weeks    Status  New      OT LONG TERM GOAL #4   Title  Pt to demo 75% finger flexion and 50% finger extension Rt hand    Time  12    Period  Weeks    Status  New      OT LONG TERM GOAL #5   Title  Pt to cook meal at mod I level using A/E PRN    Time  12    Period  Weeks    Status  New      Long Term Additional Goals   Additional Long Term Goals  Yes      OT LONG TERM GOAL #6   Title  Pt to perform light housework (laundry, cleaning) at mod I level    Time  12    Period  Weeks    Status  New            Plan - 11/02/17 1632    Clinical Impression Statement  Pt is progressing towards goals. Pt demonstrates beginning trace movement in her RUE.    Rehab Potential  Good    OT Frequency  2x / week    OT Duration  12 weeks    OT Treatment/Interventions  Self-care/ADL training;Moist Heat;Fluidtherapy;DME and/or AE instruction;Splinting;Aquatic Therapy;Therapeutic activities;Compression bandaging;Other (comment);Therapeutic exercise;Neuromuscular education;Functional Mobility Training;Passive range of motion;Visual/perceptual remediation/compensation;Manual Therapy;Electrical Stimulation;Energy conservation;Patient/family education    Plan  NMR    Consulted and Agree with Plan of Care  Patient;Family member/caregiver    Family Member Consulted  husband       Patient will benefit from skilled therapeutic intervention in order to improve  the following deficits and impairments:  Decreased coordination, Decreased range of motion, Difficulty walking, Decreased endurance, Impaired tone, Decreased activity tolerance, Decreased knowledge of precautions, Decreased balance, Decreased knowledge of use of DME, Impaired UE functional use, Pain, Decreased mobility, Decreased strength  Visit Diagnosis: Hemiplegia and  hemiparesis following cerebral infarction affecting right dominant side (HCC)  Abnormal posture  Muscle weakness (generalized)  Unsteadiness on feet  Other lack of coordination    Problem List Patient Active Problem List   Diagnosis Date Noted  . Stress and adjustment reaction 10/13/2017  . Urticaria 10/13/2017  . Former smoker 10/13/2017  . Hypokalemia   . Hypoalbuminemia due to protein-calorie malnutrition ([place])   . Leucocytosis   . Allergic contact dermatitis due to adhesives   . Cerebrovascular accident (CVA) due to occlusion of left middle cerebral artery ([place])   . Right hemiplegia ([place])   . Dysphagia, post-stroke   . Hyperlipidemia     RINE,KATHRYN 11/02/2017, 4:32 PM  [facility] [address] [place], [place], [address] Phone: [phone]   Fax:  [phone]  Name: [name] MRN: [id number] Date of Birth: 1960-11-09

## 2017-11-02 NOTE — Therapy (Signed)
[facility] [address] [place], [place], [address] Phone: [phone]   Fax:  [phone]  Physical Therapy Treatment  Patient Details  Name: [name] MRN: [id number] Date of Birth: 06/22/1961 Referring Provider: Dr. Howard Pouch   Encounter Date: 11/02/2017  PT End of Session - 11/02/17 1651    Visit Number  3    Number of Visits  16    Date for PT Re-Evaluation  12/20/17    Authorization Type  BCBS    PT Start Time  1450    PT Stop Time  1530    PT Time Calculation (min)  40 min    Equipment Utilized During Treatment  Gait belt    Activity Tolerance  Patient tolerated treatment well    Behavior During Therapy  [facility] for tasks assessed/performed       Past Medical History:  Diagnosis Date  . Hay fever   . Stroke ([place]) 08/23/2017   right sided deficits.     Past Surgical History:  Procedure Laterality Date  . IR ANGIO INTRA EXTRACRAN SEL COM CAROTID INNOMINATE UNI R MOD SED  08/21/2017  . IR ANGIO VERTEBRAL SEL SUBCLAVIAN INNOMINATE UNI R MOD SED  08/21/2017  . IR PERCUTANEOUS ART THROMBECTOMY/INFUSION INTRACRANIAL INC DIAG ANGIO  08/21/2017  . IR RADIOLOGIST EVAL & MGMT  10/30/2017  . LOOP RECORDER INSERTION N/A 08/28/2017   Procedure: LOOP RECORDER INSERTION;  Surgeon: Constance Haw, MD;  Location: [facility];  Service: Cardiovascular;  Laterality: N/A;  . RADIOLOGY WITH ANESTHESIA N/A 08/21/2017   Procedure: RADIOLOGY WITH ANESTHESIA;  Surgeon: Luanne Bras, MD;  Location: [facility];  Service: Radiology;  Laterality: N/A;  . TEE WITHOUT CARDIOVERSION N/A 08/28/2017   Procedure: TRANSESOPHAGEAL ECHOCARDIOGRAM (TEE);  Surgeon: Fay Records, MD;  Location: [facility];  Service: Cardiovascular;  Laterality: N/A;    There were no vitals filed for this visit.  Subjective Assessment - 11/02/17 1501    Subjective  Just visited physiatrist; added baclofen for tone in RUE.  Has not heard  back from dopplers; MD note states pt can continue with therapy and rigorous activity.    Patient is accompained by:  Family member    Patient Stated Goals  "make my legs look more straight," improve mobility and balance, work on stairs    Currently in Pain?  No/denies                      [facility] Adult PT Treatment/Exercise - 11/02/17 1641      Ambulation/Gait   Ambulation/Gait  Yes    Ambulation/Gait Assistance  4: Min guard    Ambulation Distance (Feet)  100 Feet    Assistive device  Small based quad cane    Gait Pattern  Step-through pattern;Decreased step length - right;Decreased stance time - right;Decreased stride length;Decreased hip/knee flexion - right;Decreased dorsiflexion - right;Lateral trunk lean to left;Trunk flexed;Poor foot clearance - right    Gait Comments  one episode of R toe catching on floor without use of AFO      Posture/Postural Control   Posture Comments  In standing assessed R hip position.  Pt noted to have L pelvic elevated, R pelvis depressed with R hip flexion and external rotation.  Pt also presented with increased tension along R TFL and IT BAND.        Self-Care   Self-Care  Other Self-Care Comments    Other Self-Care Comments  Discussed plan to assess other AFO options due to pt not wearing AFO or lace up shoes today.  Pt to wear lace up shoes next visit and will start assessment with Foot up brace to allow pt active ankle DF during gait.  Current AFO increases supination at R ankle.  Pt and husband also asking if pt can stop using the PRAFO at night.  Discussed purpose of PRAFO to prevent contracture of ankle PF and keep hip in neutral rotation.  Discussed discontinuing PRAFO but may need to add ankle gastroc stretch.        Neuro Re-ed    Neuro Re-ed Details   Performed NMR seated on balance disc with focus on trunk and postural control during weight shifting and anterior/posterior, lateral and diagonal PNF pelvic tilts with feet supported.   Therapist provided visual cues, verbal and manual facilitation for core activation, to maintain head in midline and to decreased use of neck extension and shoulder elevation to initiate weight shift.  Focused on controlled weight shifting and then returning to midline and maintaining while lifting LLE in sitting and then in standing with mod A to maintain pelvic/trunk position and weight shift.      Knee/Hip Exercises: Stretches   ITB Stretch  Right;2 reps;30 seconds in L sidelying             PT Education - 11/02/17 1651    Education provided  Yes    Education Details  see self care for discussion regarding AFO, PRAFO; IT band stretch    Person(s) Educated  Patient;Spouse    Methods  Explanation;Demonstration;Handout    Comprehension  Verbalized understanding;Returned demonstration       PT Short Term Goals - 10/25/17 1417      PT SHORT TERM GOAL #1   Title  verbalize understanding of CVA risk factors/warning signs    Status  New    Target Date  11/22/17      PT SHORT TERM GOAL #2   Title  amb > 350' with LRAD on indoor/paved outdoor surfaces modified independent for improved function    Status  New    Target Date  11/22/17      PT SHORT TERM GOAL #3   Title  improve timed up and go to < 25 sec with LRAD for improved mobility    Status  New    Target Date  11/22/17      PT SHORT TERM GOAL #4   Title  improve gait velocity to at least 1.0 ft/sec for improved mobility    Status  New    Target Date  11/22/17        PT Long Term Goals - 10/25/17 1429      PT LONG TERM GOAL #1   Title  independent with HEP    Status  New    Target Date  12/20/17      PT LONG TERM GOAL #2   Title  improve BERG balance score to >/= 46/56 for improved balance and decreased fall risk    Status  New    Target Date  12/20/17      PT LONG TERM GOAL #3   Title  improve timed up and go to < 20 sec for improved mobility    Status  New    Target Date  12/20/17      PT LONG TERM GOAL  #4   Title  improve gait velocity to > 1.8 ft/sec for decreased fall  risk    Status  New    Target Date  12/20/17      PT LONG TERM GOAL #5   Title  amb > 500' on various indoor/outdoor surfaces modified independent with LRAD for 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  Also focused on weight shifting and stance control RLE.  Added sidelying IT band and hip flexor stretch to HEP.  During gait without AFO pt demonstrated active ankle DF with only one mild episode of toe drag; pt may benefit from use of FOOT UP brace.  Current AFO encourages increased ankle/foot supination.  Will continue to assess and progress towards LTG.    PT Treatment/Interventions  ADLs/Self Care Home Management;Cryotherapy;Electrical Stimulation;Moist Heat;Therapeutic exercise;Therapeutic activities;Functional mobility training;Stair training;Gait training;DME Instruction;Neuromuscular re-education;Balance training;Patient/family education;Orthotic Fit/Training;Manual techniques;Vestibular;Taping    PT Next Visit Plan  assess for different brace R foot with lace up shoes - FOOT UP brace?  They have stopped using the PRAFO at night - may need to add gastroc stretch (have IT band stretch last session).  Strengthen R hip IR, glutes, hamstring.  SLS and weight shifting.  RLE NMR    Consulted and Agree with Plan of Care  Patient;Family member/caregiver    Family Member Consulted  husband       Patient will benefit from skilled therapeutic intervention in order to improve the following deficits and impairments:  Abnormal gait, Decreased strength, Decreased balance, Difficulty walking, Decreased mobility, Impaired perceived functional ability  Visit  Diagnosis: Hemiplegia and hemiparesis following cerebral infarction affecting right dominant side (HCC)  Abnormal posture  Muscle weakness (generalized)  Unsteadiness on feet  Other abnormalities of gait and mobility     Problem List Patient Active Problem List   Diagnosis Date Noted  . Stress and adjustment reaction 10/13/2017  . Urticaria 10/13/2017  . Former smoker 10/13/2017  . Hypokalemia   . Hypoalbuminemia due to protein-calorie malnutrition ([place])   . Leucocytosis   . Allergic contact dermatitis due to adhesives   . Cerebrovascular accident (CVA) due to occlusion of left middle cerebral artery ([place])   . Right hemiplegia ([place])   . Dysphagia, post-stroke   . Hyperlipidemia     Rico Junker, PT, DPT 11/02/17    4:57 PM    [facility] [address] [place], [place], [address] Phone: [phone]   Fax:  [phone]  Name: [name] MRN: [id number] Date of Birth: January 20, 1961

## 2017-11-02 NOTE — Patient Instructions (Signed)
IT Band: Leg Hang (Side-Lying)    Lie on left side with right leg on top. Keep hip and knee straight. Move top leg behind and hang over edge. Roll hips forwards.  Hold _30__ seconds. Relax. Repeat _2-3__ times. Do _2__ times a day. Repeat on other side.    Copyright  VHI. All rights reserved.

## 2017-11-02 NOTE — Progress Notes (Signed)
Subjective:    Patient ID: [name], female    DOB: March 12, 1961, 57 y.o.   MRN: [id number]  HPI 57 year old right-handed female with unremarkable past history except tobacco abuse, on no prescription medication, presents for follow up for left MCA infarct status post TPA and thrombectomy.  Last clinic visit 10/05/17.  History supplemented by husband. Since that time, pt states she sees Neurology.  She saw VIR, who is going to schedule pt for procedure. She saw Cards. She is following up with PCP. She continues to refrain from smoking.  She is not wearing because she was told by therapist. Her BP is controlled. Rash is resolved.  She complains of abdominal cramping.    Pain Inventory Average Pain 0 Pain Right Now 0 My pain is no pain  In the last 24 hours, has pain interfered with the following? General activity 0 Relation with others 0 Enjoyment of life 0 What TIME of day is your pain at its worst? no pain Sleep (in general) Poor  Pain is worse with: no pain Pain improves with: no pain Relief from Meds: 3  Mobility walk with assistance use a cane how many minutes can you walk? 5 ability to climb steps?  no do you drive?  no use a wheelchair needs help with transfers Do you have any goals in this area?  yes  Function not employed: date last employed . I need assistance with the following:  meal prep, household duties and shopping Do you have any goals in this area?  yes  Neuro/Psych No problems in this area  Prior Studies Any changes since last visit?  no  Physicians involved in your care Any changes since last visit?  no   Family History  Problem Relation Age of Onset  . Heart attack Father   . Hypertension Mother   . Hyperlipidemia Mother   . Diabetes type II Mother   . Hypertension Brother    Social History   Socioeconomic History  . Marital status: Married    Spouse name: None  . Number of children: 2  . Years of education: None  . Highest  education level: None  Social Needs  . Financial resource strain: None  . Food insecurity - worry: None  . Food insecurity - inability: None  . Transportation needs - medical: None  . Transportation needs - non-medical: None  Occupational History  . None  Tobacco Use  . Smoking status: Former Smoker    Types: Cigarettes  . Smokeless tobacco: Never Used  Substance and Sexual Activity  . Alcohol use: No    Frequency: Never  . Drug use: No  . Sexual activity: Yes    Partners: Male  Other Topics Concern  . None  Social History Narrative   Married. 2 children.    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    Walks with cane or wheelchair.    Former smoker.    Drinks caffeine.    Smoke alarm in the home.   Firearms in the home.    Wears her seatbelt.    Feels safe In her relationships.    Past Surgical History:  Procedure Laterality Date  . IR ANGIO INTRA EXTRACRAN SEL COM CAROTID INNOMINATE UNI R MOD SED  08/21/2017  . IR ANGIO VERTEBRAL SEL SUBCLAVIAN INNOMINATE UNI R MOD SED  08/21/2017  . IR PERCUTANEOUS ART THROMBECTOMY/INFUSION INTRACRANIAL INC DIAG ANGIO  08/21/2017  . IR RADIOLOGIST EVAL & MGMT  10/30/2017  . LOOP RECORDER INSERTION  N/A 08/28/2017   Procedure: LOOP RECORDER INSERTION;  Surgeon: Constance Haw, MD;  Location: [facility];  Service: Cardiovascular;  Laterality: N/A;  . RADIOLOGY WITH ANESTHESIA N/A 08/21/2017   Procedure: RADIOLOGY WITH ANESTHESIA;  Surgeon: Luanne Bras, MD;  Location: [facility];  Service: Radiology;  Laterality: N/A;  . TEE WITHOUT CARDIOVERSION N/A 08/28/2017   Procedure: TRANSESOPHAGEAL ECHOCARDIOGRAM (TEE);  Surgeon: Fay Records, MD;  Location: [facility];  Service: Cardiovascular;  Laterality: N/A;   Past Medical History:  Diagnosis Date  . Hay fever   . Stroke ([place]) 08/23/2017   right sided deficits.    BP 136/73   Pulse 93   LMP  (LMP Unknown)   SpO2 93%   Opioid Risk Score:   Fall Risk Score:  `1  Depression  screen PHQ 2/9  Depression screen [facility] 2/9 10/13/2017 10/05/2017 10/05/2017  Decreased Interest 0 1 0  Down, Depressed, Hopeless 0 0 0  PHQ - 2 Score 0 1 0  Altered sleeping - 0 -  Tired, decreased energy - 2 -  Change in appetite - 0 -  Feeling bad or failure about yourself  - 0 -  Trouble concentrating - 1 -  Moving slowly or fidgety/restless - 1 -  Suicidal thoughts - 0 -  PHQ-9 Score - 5 -  Difficult doing work/chores - Not difficult at all -    Review of Systems  HENT: Negative.   Eyes: Negative.   Respiratory: Negative.   Cardiovascular: Negative.   Gastrointestinal: Negative.   Endocrine: Negative.   Genitourinary: Negative.   Musculoskeletal: Positive for gait problem.  Skin: Positive for rash.  Allergic/Immunologic: Negative.   Neurological: Positive for weakness and numbness.  Hematological: Negative.   Psychiatric/Behavioral: Negative.   All other systems reviewed and are negative.      Objective:   Physical Exam Constitutional: She appears well-developed and well-nourished.  HENT: Atraumatic. Normocephalic. Eyes: EOMI. No discharge.  Cardiovascular: RRR. No JVD   Respiratory: Normal effort. Clear. GI: Bowel sounds are normal. Non-distended Musculoskeletal: She exhibits no edema or tenderness.  Neurological: She is alert.  Right facial droop  A&Ox3 Word finding difficulty, improved Motor: Right upper extremity: 1+/5 shoulder abduction, elbow flex/ext, distally 0/5  Right lower extremity: Hip flexion 4-/5, knee extension 4-/5, ADF/PF 3/5  MAS: elbow flexors 1+/4, wrist ext 1+/4, finger flexors 1+/4, ankle plantor flexors 1+/4 Skin: Scattered rash Psychiatric: Affect is pleasant and appropriate    Assessment & Plan:  57 year old right-handed female with unremarkable past history except tobacco abuse, on no prescription medication, presents for follow up for left MCA infarct status post TPA and thrombectomy.  1. Right hemiplegia, dysphagia, hypophonia  secondary to acute left MCA infarct status post TPA and thrombectomy. Status post loop recorder  Cont therapies  Encouraged compliance with PRAFO/WHO, again   Cont Fluoxetine   Follow up with Vascular - plans for arterial studies, notes reviewed  2. Drug allergy   Secondary to metoprolol  Encouraged hygiene  Cont Benadryl/Cortisone cream PRN  3. Constipation  Miralax ordered  4. Spastic hemiplegia  Will order Baclofen 5 TID

## 2017-11-07 ENCOUNTER — Ambulatory Visit: Payer: BLUE CROSS/BLUE SHIELD | Admitting: Physical Therapy

## 2017-11-07 ENCOUNTER — Ambulatory Visit: Payer: BLUE CROSS/BLUE SHIELD | Admitting: Occupational Therapy

## 2017-11-07 ENCOUNTER — Encounter: Payer: Self-pay | Admitting: Physical Therapy

## 2017-11-07 ENCOUNTER — Other Ambulatory Visit (HOSPITAL_COMMUNITY): Payer: Self-pay | Admitting: Interventional Radiology

## 2017-11-07 DIAGNOSIS — R2681 Unsteadiness on feet: Secondary | ICD-10-CM | POA: Diagnosis not present

## 2017-11-07 DIAGNOSIS — I70209 Unspecified atherosclerosis of native arteries of extremities, unspecified extremity: Secondary | ICD-10-CM

## 2017-11-07 DIAGNOSIS — R2689 Other abnormalities of gait and mobility: Secondary | ICD-10-CM

## 2017-11-07 DIAGNOSIS — R278 Other lack of coordination: Secondary | ICD-10-CM | POA: Diagnosis not present

## 2017-11-07 DIAGNOSIS — I69351 Hemiplegia and hemiparesis following cerebral infarction affecting right dominant side: Secondary | ICD-10-CM

## 2017-11-07 DIAGNOSIS — R209 Unspecified disturbances of skin sensation: Secondary | ICD-10-CM

## 2017-11-07 DIAGNOSIS — R293 Abnormal posture: Secondary | ICD-10-CM

## 2017-11-07 DIAGNOSIS — M6281 Muscle weakness (generalized): Secondary | ICD-10-CM

## 2017-11-07 DIAGNOSIS — R600 Localized edema: Secondary | ICD-10-CM | POA: Diagnosis not present

## 2017-11-07 NOTE — Therapy (Signed)
[facility] [address] [place], [place], [address] Phone: [phone]   Fax:  [phone]  Occupational Therapy Treatment  Patient Details  Name: [name] MRN: [id number] Date of Birth: Jun 05, 1961 Referring Provider: Dr. Howard Pouch   Encounter Date: 11/07/2017  OT End of Session - 11/07/17 1755    Visit Number  4    Number of Visits  25    Date for OT Re-Evaluation  01/17/18    Authorization Type  BC/BS    Authorization - Visit Number  4    Authorization - Number of Visits  24    OT Start Time  8563    OT Stop Time  1615    OT Time Calculation (min)  38 min    Activity Tolerance  Patient tolerated treatment well    Behavior During Therapy  [facility] for tasks assessed/performed       Past Medical History:  Diagnosis Date  . Hay fever   . Stroke ([place]) 08/23/2017   right sided deficits.     Past Surgical History:  Procedure Laterality Date  . IR ANGIO INTRA EXTRACRAN SEL COM CAROTID INNOMINATE UNI R MOD SED  08/21/2017  . IR ANGIO VERTEBRAL SEL SUBCLAVIAN INNOMINATE UNI R MOD SED  08/21/2017  . IR PERCUTANEOUS ART THROMBECTOMY/INFUSION INTRACRANIAL INC DIAG ANGIO  08/21/2017  . IR RADIOLOGIST EVAL & MGMT  10/30/2017  . LOOP RECORDER INSERTION N/A 08/28/2017   Procedure: LOOP RECORDER INSERTION;  Surgeon: Constance Haw, MD;  Location: [facility];  Service: Cardiovascular;  Laterality: N/A;  . RADIOLOGY WITH ANESTHESIA N/A 08/21/2017   Procedure: RADIOLOGY WITH ANESTHESIA;  Surgeon: Luanne Bras, MD;  Location: [facility];  Service: Radiology;  Laterality: N/A;  . TEE WITHOUT CARDIOVERSION N/A 08/28/2017   Procedure: TRANSESOPHAGEAL ECHOCARDIOGRAM (TEE);  Surgeon: Fay Records, MD;  Location: [facility];  Service: Cardiovascular;  Laterality: N/A;    There were no vitals filed for this visit.  Subjective Assessment - 11/07/17 1755    Limitations  *loop recorder, fall risk, no driving    Patient Stated Goals  Get my arm working    Currently in Pain?  No/denies             treatment: NMR with weightbearing through mat and tilted stool, mod facilitation. Therapist nitiated fabrication of a resting hand splint for improved RUE positioning, yet therapist did not complete due to time constraints. Plan to complete next visit.                OT Short Term Goals - 10/25/17 1242      OT SHORT TERM GOAL #1   Title  Independent with initial HEP     Time  6    Period  Weeks    Status  New    Target Date  12/06/17      OT SHORT TERM GOAL #2   Title  Pt to demo 25 degrees shoulder flexion in prep for low level reaching    Time  6    Period  Weeks    Status  New      OT SHORT TERM GOAL #3   Title  Pt to demo 50% finger flexion and 25% finger extension in prep for functional grasp/release    Time  6    Period  Weeks    Status  New      OT SHORT TERM GOAL #4   Title  Pt/family  to verbalize understanding with edema management strategies Rt hand     Time  6    Period  Weeks    Status  New      OT SHORT TERM GOAL #5   Title  Pt to verbalize understanding with A/E needs to increased independence with ADLS/IADLS    Time  6    Period  Weeks    Status  New        OT Long Term Goals - 10/25/17 1245      OT LONG TERM GOAL #1   Title  Independent with updated HEP    Time  12    Period  Weeks    Status  New    Target Date  01/17/18      OT LONG TERM GOAL #2   Title  Pt to demo functional use of Rt hand as evidenced by performing 8 blocks on Box & Blocks test    Time  12    Period  Weeks    Status  New      OT LONG TERM GOAL #3   Title  Pt to demo 40 degrees shoulder flexion RUE    Time  12    Period  Weeks    Status  New      OT LONG TERM GOAL #4   Title  Pt to demo 75% finger flexion and 50% finger extension Rt hand    Time  12    Period  Weeks    Status  New      OT LONG TERM GOAL #5   Title  Pt to cook meal at mod I level using A/E  PRN    Time  12    Period  Weeks    Status  New      Long Term Additional Goals   Additional Long Term Goals  Yes      OT LONG TERM GOAL #6   Title  Pt to perform light housework (laundry, cleaning) at mod I level    Time  12    Period  Weeks    Status  New            Plan - 11/07/17 1756    Clinical Impression Statement  Pt is progressing towards goals. Pt is highly motivated to improve    Rehab Potential  Good    OT Frequency  2x / week    OT Duration  12 weeks    OT Treatment/Interventions  Self-care/ADL training;Moist Heat;Fluidtherapy;DME and/or AE instruction;Splinting;Aquatic Therapy;Therapeutic activities;Compression bandaging;Other (comment);Therapeutic exercise;Neuromuscular education;Functional Mobility Training;Passive range of motion;Visual/perceptual remediation/compensation;Manual Therapy;Electrical Stimulation;Energy conservation;Patient/family education    Plan  NMR    Consulted and Agree with Plan of Care  Patient       Patient will benefit from skilled therapeutic intervention in order to improve the following deficits and impairments:  Decreased coordination, Decreased range of motion, Difficulty walking, Decreased endurance, Impaired tone, Decreased activity tolerance, Decreased knowledge of precautions, Decreased balance, Decreased knowledge of use of DME, Impaired UE functional use, Pain, Decreased mobility, Decreased strength  Visit Diagnosis: Hemiplegia and hemiparesis following cerebral infarction affecting right dominant side (HCC)  Abnormal posture  Muscle weakness (generalized)    Problem List Patient Active Problem List   Diagnosis Date Noted  . Stress and adjustment reaction 10/13/2017  . Urticaria 10/13/2017  . Former smoker 10/13/2017  . Hypokalemia   . Hypoalbuminemia due to protein-calorie malnutrition ([place])   . Leucocytosis   .  Allergic contact dermatitis due to adhesives   . Cerebrovascular accident (CVA) due to occlusion of  left middle cerebral artery ([place])   . Right hemiplegia ([place])   . Dysphagia, post-stroke   . Hyperlipidemia     RINE,KATHRYN 11/07/2017, 5:56 PM  [facility] [address] [place], [place], [address] Phone: [phone]   Fax:  [phone]  Name: SHAWNDA MAUNEY MRN: [id number] Date of Birth: Jan 19, 1961

## 2017-11-08 ENCOUNTER — Ambulatory Visit: Payer: BLUE CROSS/BLUE SHIELD | Admitting: Physical Therapy

## 2017-11-08 ENCOUNTER — Encounter: Payer: Self-pay | Admitting: Physical Therapy

## 2017-11-08 ENCOUNTER — Ambulatory Visit: Payer: BLUE CROSS/BLUE SHIELD | Admitting: Occupational Therapy

## 2017-11-08 DIAGNOSIS — R2689 Other abnormalities of gait and mobility: Secondary | ICD-10-CM | POA: Diagnosis not present

## 2017-11-08 DIAGNOSIS — I69351 Hemiplegia and hemiparesis following cerebral infarction affecting right dominant side: Secondary | ICD-10-CM

## 2017-11-08 DIAGNOSIS — M6281 Muscle weakness (generalized): Secondary | ICD-10-CM

## 2017-11-08 DIAGNOSIS — R2681 Unsteadiness on feet: Secondary | ICD-10-CM

## 2017-11-08 DIAGNOSIS — R600 Localized edema: Secondary | ICD-10-CM | POA: Diagnosis not present

## 2017-11-08 DIAGNOSIS — R278 Other lack of coordination: Secondary | ICD-10-CM | POA: Diagnosis not present

## 2017-11-08 DIAGNOSIS — R293 Abnormal posture: Secondary | ICD-10-CM | POA: Diagnosis not present

## 2017-11-08 NOTE — Patient Instructions (Signed)
Your Splint This splint should initially be fitted by a healthcare practitioner.  The healthcare practitioner is responsible for providing wearing instructions and precautions to the patient, other healthcare practitioners and care provider involved in the patient's care.  This splint was custom made for you. Please read the following instructions to learn about wearing and caring for your splint.  Precautions Should your splint cause any of the following problems, remove the splint immediately and contact your therapist/physician.  Swelling  Severe Pain  Pressure Areas  Stiffness  Numbness  Do not wear your splint while operating machinery unless it has been fabricated for that purpose.  When To Wear Your Splint Where your splint according to your therapist/physician instructions. Daytime for 1-2 hours initially while at rest, if no problems progress to wearing for 3-4 hrs while awake, if after approximately 1 week if no problems wear your splint at night.  Care and Cleaning of Your Splint 1. Keep your splint away from open flames. 2. Your splint will lose its shape in temperatures over 135 degrees Farenheit, ( in car windows, near radiators, ovens or in hot water).  Never make any adjustments to your splint, if the splint needs adjusting remove it and make an appointment to see your therapist. 3. Your splint, including the cushion liner may be cleaned with soap and lukewarm water ro clean while alcohol on a cotton ball.  Do not immerse in hot water over 135 degrees Farenheit. 4. Straps may be washed with soap and water, but do not moisten the self-adhesive portion.

## 2017-11-08 NOTE — Therapy (Signed)
[facility] [address] [place], [place], [address] Phone: [phone]   Fax:  [phone]  Physical Therapy Treatment  Patient Details  Name: [name] MRN: [id number] Date of Birth: [birth year] Referring Provider: Dr. Howard Pouch   Encounter Date: 11/07/2017  PT End of Session - 11/07/17 1458    Visit Number  4    Number of Visits  16    Date for PT Re-Evaluation  12/20/17    Authorization Type  BCBS    PT Start Time  1450    PT Stop Time  1530    PT Time Calculation (min)  40 min    Equipment Utilized During Treatment  Gait belt    Activity Tolerance  Patient tolerated treatment well    Behavior During Therapy  [facility] for tasks assessed/performed       Past Medical History:  Diagnosis Date  . Hay fever   . Stroke ([place]) 08/23/2017   right sided deficits.     Past Surgical History:  Procedure Laterality Date  . IR ANGIO INTRA EXTRACRAN SEL COM CAROTID INNOMINATE UNI R MOD SED  08/21/2017  . IR ANGIO VERTEBRAL SEL SUBCLAVIAN INNOMINATE UNI R MOD SED  08/21/2017  . IR PERCUTANEOUS ART THROMBECTOMY/INFUSION INTRACRANIAL INC DIAG ANGIO  08/21/2017  . IR RADIOLOGIST EVAL & MGMT  10/30/2017  . LOOP RECORDER INSERTION N/A 08/28/2017   Procedure: LOOP RECORDER INSERTION;  Surgeon: Constance Haw, MD;  Location: [facility];  Service: Cardiovascular;  Laterality: N/A;  . RADIOLOGY WITH ANESTHESIA N/A 08/21/2017   Procedure: RADIOLOGY WITH ANESTHESIA;  Surgeon: Luanne Bras, MD;  Location: [facility];  Service: Radiology;  Laterality: N/A;  . TEE WITHOUT CARDIOVERSION N/A 08/28/2017   Procedure: TRANSESOPHAGEAL ECHOCARDIOGRAM (TEE);  Surgeon: Fay Records, MD;  Location: [facility];  Service: Cardiovascular;  Laterality: N/A;    There were no vitals filed for this visit.  Subjective Assessment - 11/07/17 1457    Subjective  No new falls. Has the dopplar set for next Dopplar. No pain.     Patient  is accompained by:  Family member    Limitations  Walking    Patient Stated Goals  "make my legs look more straight," improve mobility and balance, work on stairs    Currently in Pain?  No/denies    Pain Score  0-No pain           [facility] Adult PT Treatment/Exercise - 11/08/17 0836      Ambulation/Gait   Ambulation/Gait  Yes    Ambulation/Gait Assistance  4: Min guard;4: Min assist    Ambulation Distance (Feet)  100 Feet    Assistive device  Small based quad cane    Gait Pattern  Step-through pattern;Decreased step length - right;Decreased stance time - right;Decreased stride length;Decreased hip/knee flexion - right;Decreased dorsiflexion - right;Lateral trunk lean to left;Trunk flexed;Poor foot clearance - right      Neuro Re-ed    Neuro Re-ed Details   in tall kneeling on mat table with bil UE's on red ball: mini squats x 10 reps with cues/facilitation for equal LE weight bearing and return to full  upright posture; moving knee out and back in x 10 reps with each leg, cues/facilitation to weight shift and for posture.       Exercises   Other Exercises   in hooklying/supine- bridge with bil LE's x3 reps, progressing to right single leg bridge with 5 sec hold x 10  reps; in left sidelying: right clamshell with 5 sec hold x 10 reps; prone: right glut kick backs with assist for LE positioning, 3 sec holds x 10 reps.            PT Short Term Goals - 10/25/17 1417      PT SHORT TERM GOAL #1   Title  verbalize understanding of CVA risk factors/warning signs    Status  New    Target Date  11/22/17      PT SHORT TERM GOAL #2   Title  amb > 350' with LRAD on indoor/paved outdoor surfaces modified independent for improved function    Status  New    Target Date  11/22/17      PT SHORT TERM GOAL #3   Title  improve timed up and go to < 25 sec with LRAD for improved mobility    Status  New    Target Date  11/22/17      PT SHORT TERM GOAL #4   Title  improve gait velocity to at  least 1.0 ft/sec for improved mobility    Status  New    Target Date  11/22/17        PT Long Term Goals - 10/25/17 1429      PT LONG TERM GOAL #1   Title  independent with HEP    Status  New    Target Date  12/20/17      PT LONG TERM GOAL #2   Title  improve BERG balance score to >/= 46/56 for improved balance and decreased fall risk    Status  New    Target Date  12/20/17      PT LONG TERM GOAL #3   Title  improve timed up and go to < 20 sec for improved mobility    Status  New    Target Date  12/20/17      PT LONG TERM GOAL #4   Title  improve gait velocity to > 1.8 ft/sec for decreased fall risk    Status  New    Target Date  12/20/17      PT LONG TERM GOAL #5   Title  amb > 500' on various indoor/outdoor surfaces modified independent with LRAD for 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 Will need to continue to assess braces with gait. Pt is progresssing toward goals and should benefit from continued PT to progress toward unmet goals.     Rehab Potential  Good    PT Frequency  2x / week    PT Duration  8 weeks    PT Treatment/Interventions  ADLs/Self Care Home Management;Cryotherapy;Electrical Stimulation;Moist Heat;Therapeutic exercise;Therapeutic activities;Functional mobility training;Stair training;Gait training;DME Instruction;Neuromuscular re-education;Balance training;Patient/family education;Orthotic Fit/Training;Manual techniques;Vestibular;Taping    PT Next Visit Plan  add gastroc stretch to HEP; continue with strengthen R hip IR, glutes, hamstring.  SLS and weight shifting.  RLE NMR, continue with use of foot up brace, ? need for additional knee brace    Consulted and Agree with Plan of Care  Patient;Family member/caregiver    Family Member Consulted   husband       Patient will benefit from skilled therapeutic intervention in order to improve  the following deficits and impairments:  Abnormal gait, Decreased strength, Decreased balance, Difficulty walking, Decreased mobility, Impaired perceived functional ability  Visit Diagnosis: Hemiplegia and hemiparesis following cerebral infarction affecting right dominant side (HCC)  Muscle weakness (generalized)  Other abnormalities of gait and mobility  Unsteadiness on feet     Problem List Patient Active Problem List   Diagnosis Date Noted  . Stress and adjustment reaction 10/13/2017  . Urticaria 10/13/2017  . Former smoker 10/13/2017  . Hypokalemia   . Hypoalbuminemia due to protein-calorie malnutrition ([place])   . Leucocytosis   . Allergic contact dermatitis due to adhesives   . Cerebrovascular accident (CVA) due to occlusion of left middle cerebral artery ([place])   . Right hemiplegia ([place])   . Dysphagia, post-stroke   . Hyperlipidemia     Willow Ora, PTA, [facility] [address] [place], [place] [address] [phone] 11/08/17, 8:40 AM   Name: [name] MRN: [id number] Date of Birth: Feb 11, 1961

## 2017-11-08 NOTE — Therapy (Signed)
[facility] [address] [place], [place], [address] Phone: [phone]   Fax:  [phone]  Occupational Therapy Treatment  Patient Details  Name: [name] MRN: [id number] Date of Birth: May 27, 1961 Referring Provider: Dr. Howard Pouch   Encounter Date: 11/08/2017  OT End of Session - 11/08/17 1820    Visit Number  5    Number of Visits  25    Date for OT Re-Evaluation  01/17/18    Authorization Type  BC/BS    Authorization - Visit Number  5    Authorization - Number of Visits  24 counts as 1 visit if PT/OT seen on same day    OT Start Time  1620    OT Stop Time  1700    OT Time Calculation (min)  40 min    Activity Tolerance  Patient tolerated treatment well    Behavior During Therapy  [facility] for tasks assessed/performed       Past Medical History:  Diagnosis Date  . Hay fever   . Stroke ([place]) 08/23/2017   right sided deficits.     Past Surgical History:  Procedure Laterality Date  . IR ANGIO INTRA EXTRACRAN SEL COM CAROTID INNOMINATE UNI R MOD SED  08/21/2017  . IR ANGIO VERTEBRAL SEL SUBCLAVIAN INNOMINATE UNI R MOD SED  08/21/2017  . IR PERCUTANEOUS ART THROMBECTOMY/INFUSION INTRACRANIAL INC DIAG ANGIO  08/21/2017  . IR RADIOLOGIST EVAL & MGMT  10/30/2017  . LOOP RECORDER INSERTION N/A 08/28/2017   Procedure: LOOP RECORDER INSERTION;  Surgeon: Constance Haw, MD;  Location: [facility];  Service: Cardiovascular;  Laterality: N/A;  . RADIOLOGY WITH ANESTHESIA N/A 08/21/2017   Procedure: RADIOLOGY WITH ANESTHESIA;  Surgeon: Luanne Bras, MD;  Location: [place];  Service: Radiology;  Laterality: N/A;  . TEE WITHOUT CARDIOVERSION N/A 08/28/2017   Procedure: TRANSESOPHAGEAL ECHOCARDIOGRAM (TEE);  Surgeon: Fay Records, MD;  Location: [facility];  Service: Cardiovascular;  Laterality: N/A;    There were no vitals filed for this visit.  Subjective Assessment - 11/08/17 1624    Subjective    Denies pain    Limitations  *loop recorder, fall risk, no driving    Currently in Pain?  Yes    Pain Score  2     Pain Location  Hand    Pain Orientation  Right;Left    Pain Type  Acute pain    Pain Onset  More than a month ago    Pain Frequency  Intermittent    Aggravating Factors   unsure    Pain Relieving Factors  unknown    Multiple Pain Sites  No                  Treatment: therapsit completed custom resting hand splint and issued to pt.          OT Education - 11/08/17 1822    Education provided  Yes    Education Details  splint wear care and precautions. Pt was issued a custom resting hand splint. See pt instuctions.    Person(s) Educated  Patient;Spouse    Methods  Explanation;Demonstration;Verbal cues;Handout    Comprehension  Verbalized understanding;Returned demonstration;Verbal cues required       OT Short Term Goals - 10/25/17 1242      OT SHORT TERM GOAL #1   Title  Independent with initial HEP     Time  6    Period  Weeks  Status  New    Target Date  12/06/17      OT SHORT TERM GOAL #2   Title  Pt to demo 25 degrees shoulder flexion in prep for low level reaching    Time  6    Period  Weeks    Status  New      OT SHORT TERM GOAL #3   Title  Pt to demo 50% finger flexion and 25% finger extension in prep for functional grasp/release    Time  6    Period  Weeks    Status  New      OT SHORT TERM GOAL #4   Title  Pt/family to verbalize understanding with edema management strategies Rt hand     Time  6    Period  Weeks    Status  New      OT SHORT TERM GOAL #5   Title  Pt to verbalize understanding with A/E needs to increased independence with ADLS/IADLS    Time  6    Period  Weeks    Status  New        OT Long Term Goals - 10/25/17 1245      OT LONG TERM GOAL #1   Title  Independent with updated HEP    Time  12    Period  Weeks    Status  New    Target Date  01/17/18      OT LONG TERM GOAL #2   Title  Pt to demo  functional use of Rt hand as evidenced by performing 8 blocks on Box & Blocks test    Time  12    Period  Weeks    Status  New      OT LONG TERM GOAL #3   Title  Pt to demo 40 degrees shoulder flexion RUE    Time  12    Period  Weeks    Status  New      OT LONG TERM GOAL #4   Title  Pt to demo 75% finger flexion and 50% finger extension Rt hand    Time  12    Period  Weeks    Status  New      OT LONG TERM GOAL #5   Title  Pt to cook meal at mod I level using A/E PRN    Time  12    Period  Weeks    Status  New      Long Term Additional Goals   Additional Long Term Goals  Yes      OT LONG TERM GOAL #6   Title  Pt to perform light housework (laundry, cleaning) at mod I level    Time  12    Period  Weeks    Status  New            Plan - 11/08/17 1821    Clinical Impression Statement  Pt is progressing towards goals. Pt is highly motivated to improve. Pt was issued resting hand splint for improved positioning.    Rehab Potential  Good    OT Frequency  2x / week    OT Duration  12 weeks    OT Treatment/Interventions  Self-care/ADL training;Moist Heat;Fluidtherapy;DME and/or AE instruction;Splinting;Aquatic Therapy;Therapeutic activities;Compression bandaging;Other (comment);Therapeutic exercise;Neuromuscular education;Functional Mobility Training;Passive range of motion;Visual/perceptual remediation/compensation;Manual Therapy;Electrical Stimulation;Energy conservation;Patient/family education    Plan  splint check NMR    Consulted and Agree with Plan of Care  Patient  Family Member Consulted  husband       Patient will benefit from skilled therapeutic intervention in order to improve the following deficits and impairments:  Decreased coordination, Decreased range of motion, Difficulty walking, Decreased endurance, Impaired tone, Decreased activity tolerance, Decreased knowledge of precautions, Decreased balance, Decreased knowledge of use of DME, Impaired UE functional  use, Pain, Decreased mobility, Decreased strength  Visit Diagnosis: Hemiplegia and hemiparesis following cerebral infarction affecting right dominant side (HCC)  Muscle weakness (generalized)  Other abnormalities of gait and mobility    Problem List Patient Active Problem List   Diagnosis Date Noted  . Stress and adjustment reaction 10/13/2017  . Urticaria 10/13/2017  . Former smoker 10/13/2017  . Hypokalemia   . Hypoalbuminemia due to protein-calorie malnutrition ([place])   . Leucocytosis   . Allergic contact dermatitis due to adhesives   . Cerebrovascular accident (CVA) due to occlusion of left middle cerebral artery ([place])   . Right hemiplegia ([place])   . Dysphagia, post-stroke   . Hyperlipidemia     Frances Ambrosino 11/08/2017, 6:24 PM Theone Murdoch, OTR/L [phone] Phone: [phone] 6:24 PM 11/08/17 [facility] [address] [place], [place], [address] Phone: [phone]   Fax:  [phone]  Name: [name] MRN: [id number] Date of Birth: November 26, 1960

## 2017-11-09 NOTE — Therapy (Signed)
[facility] [address] [place], [place], [address] Phone: [phone]   Fax:  [phone]  Physical Therapy Treatment  Patient Details  Name: [name] MRN: [id number] Date of Birth: Jun 24, 1961 Referring Provider: Dr. Howard Pouch   Encounter Date: 11/08/2017  PT End of Session - 11/08/17 1539    Visit Number  5    Number of Visits  16    Date for PT Re-Evaluation  12/20/17    Authorization Type  BCBS    Authorization Time Period  24 visits combined:     PT Start Time  1533    PT Stop Time  1615    PT Time Calculation (min)  42 min    Equipment Utilized During Treatment  Gait belt    Activity Tolerance  Patient tolerated treatment well    Behavior During Therapy  [facility] for tasks assessed/performed       Past Medical History:  Diagnosis Date  . Hay fever   . Stroke ([place]) 08/23/2017   right sided deficits.     Past Surgical History:  Procedure Laterality Date  . IR ANGIO INTRA EXTRACRAN SEL COM CAROTID INNOMINATE UNI R MOD SED  08/21/2017  . IR ANGIO VERTEBRAL SEL SUBCLAVIAN INNOMINATE UNI R MOD SED  08/21/2017  . IR PERCUTANEOUS ART THROMBECTOMY/INFUSION INTRACRANIAL INC DIAG ANGIO  08/21/2017  . IR RADIOLOGIST EVAL & MGMT  10/30/2017  . LOOP RECORDER INSERTION N/A 08/28/2017   Procedure: LOOP RECORDER INSERTION;  Surgeon: Constance Haw, MD;  Location: [facility];  Service: Cardiovascular;  Laterality: N/A;  . RADIOLOGY WITH ANESTHESIA N/A 08/21/2017   Procedure: RADIOLOGY WITH ANESTHESIA;  Surgeon: Luanne Bras, MD;  Location: [facility];  Service: Radiology;  Laterality: N/A;  . TEE WITHOUT CARDIOVERSION N/A 08/28/2017   Procedure: TRANSESOPHAGEAL ECHOCARDIOGRAM (TEE);  Surgeon: Fay Records, MD;  Location: [facility];  Service: Cardiovascular;  Laterality: N/A;    There were no vitals filed for this visit.  Subjective Assessment - 11/08/17 1536    Subjective  No new complaints.  Sore on the back of her legs from yesterday and has a bruise on her knee, not sure if it's from being on knees yesterday or if it was already there. No falls.     Patient is accompained by:  Family member    Limitations  Walking    Currently in Pain?  Yes    Pain Score  2     Pain Location  Hand    Pain Orientation  Right;Left    Pain Descriptors / Indicators  Tingling    Pain Type  Acute pain    Pain Onset  More than a month ago    Pain Frequency  Intermittent    Aggravating Factors   unsure    Pain Relieving Factors  nothing so far           [facility] Adult PT Treatment/Exercise - 11/08/17 1545      Neuro Re-ed    Neuro Re-ed Details   for mm strengthening/balance: single leg stance activities: right stance with left foot taps to 6 inch box, right stance with left fwd/bwd stepping over half foam bolster, min to mod assist with cues on weight shifting, posture, assist to stabilize right LE; sit/stands with left foot on foam bubble to reinforce increased use of right LE with min assist for balance.  Exercises   Other Exercises   in hooklying/supine: bil bridges 5 sec hold x 10 reps with arms across chest; right single leg bridge x 10 reps with 3 sec holds; with green band resistance hip fall out on right 2 sets of 10 reps; prone: hamstring curls with yellow band x 7, then no resistance for remaining 3 reps to 10, glut kick backs with manual assist x 10 reps                                 PT Short Term Goals - 10/25/17 1417      PT SHORT TERM GOAL #1   Title  verbalize understanding of CVA risk factors/warning signs    Status  New    Target Date  11/22/17      PT SHORT TERM GOAL #2   Title  amb > 350' with LRAD on indoor/paved outdoor surfaces modified independent for improved function    Status  New    Target Date  11/22/17      PT SHORT TERM GOAL #3   Title  improve timed up and go to < 25 sec with LRAD for improved mobility    Status  New    Target  Date  11/22/17      PT SHORT TERM GOAL #4   Title  improve gait velocity to at least 1.0 ft/sec for improved mobility    Status  New    Target Date  11/22/17        PT Long Term Goals - 10/25/17 1429      PT LONG TERM GOAL #1   Title  independent with HEP    Status  New    Target Date  12/20/17      PT LONG TERM GOAL #2   Title  improve BERG balance score to >/= 46/56 for improved balance and decreased fall risk    Status  New    Target Date  12/20/17      PT LONG TERM GOAL #3   Title  improve timed up and go to < 20 sec for improved mobility    Status  New    Target Date  12/20/17      PT LONG TERM GOAL #4   Title  improve gait velocity to > 1.8 ft/sec for decreased fall risk    Status  New    Target Date  12/20/17      PT LONG TERM GOAL #5   Title  amb > 500' on various indoor/outdoor surfaces modified independent with LRAD for improved 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 Pt continues to have decreased right LE stability and should benefit from continued PT to address strength/balance and progress toward unmet goals.     Rehab Potential  Good    PT Frequency  2x / week    PT Duration  8 weeks    PT Treatment/Interventions  ADLs/Self Care Home Management;Cryotherapy;Electrical Stimulation;Moist Heat;Therapeutic exercise;Therapeutic activities;Functional mobility training;Stair training;Gait training;DME Instruction;Neuromuscular re-education;Balance training;Patient/family education;Orthotic Fit/Training;Manual techniques;Vestibular;Taping    PT Next Visit Plan  add gastroc stretch to HEP if needed;  continue with strengthen R hip IR, glutes, hamstring.  SLS and weight shifting.  RLE NMR, continue with use of foot up brace, ? need for  additional knee brace    Consulted and Agree with Plan of Care  Patient;Family member/caregiver    Family Member Consulted  husband       Patient will benefit from skilled therapeutic intervention in order to improve the following deficits and impairments:  Abnormal gait, Decreased strength, Decreased balance, Difficulty walking, Decreased mobility, Impaired perceived functional ability  Visit Diagnosis: Hemiplegia and hemiparesis following cerebral infarction affecting right dominant side (HCC)  Muscle weakness (generalized)  Other abnormalities of gait and mobility  Unsteadiness on feet     Problem List Patient Active Problem List   Diagnosis Date Noted  . Stress and adjustment reaction 10/13/2017  . Urticaria 10/13/2017  . Former smoker 10/13/2017  . Hypokalemia   . Hypoalbuminemia due to protein-calorie malnutrition ([place])   . Leucocytosis   . Allergic contact dermatitis due to adhesives   . Cerebrovascular accident (CVA) due to occlusion of left middle cerebral artery ([place])   . Right hemiplegia ([place])   . Dysphagia, post-stroke   . Hyperlipidemia     Willow Ora, PTA, [facility] [address], [place], [place] [address] [phone] 11/09/17, 10:05 PM   Name: [name] MRN: [id number] Date of Birth: [birth year]

## 2017-11-10 ENCOUNTER — Encounter: Payer: Self-pay | Admitting: Family Medicine

## 2017-11-10 ENCOUNTER — Ambulatory Visit: Payer: BLUE CROSS/BLUE SHIELD | Admitting: Family Medicine

## 2017-11-10 VITALS — BP 132/77 | HR 98 | Temp 97.6°F | Ht 61.0 in | Wt 129.0 lb

## 2017-11-10 DIAGNOSIS — F4329 Adjustment disorder with other symptoms: Secondary | ICD-10-CM

## 2017-11-10 DIAGNOSIS — L509 Urticaria, unspecified: Secondary | ICD-10-CM | POA: Diagnosis not present

## 2017-11-10 DIAGNOSIS — G8191 Hemiplegia, unspecified affecting right dominant side: Secondary | ICD-10-CM

## 2017-11-10 DIAGNOSIS — K5900 Constipation, unspecified: Secondary | ICD-10-CM

## 2017-11-10 DIAGNOSIS — I63512 Cerebral infarction due to unspecified occlusion or stenosis of left middle cerebral artery: Secondary | ICD-10-CM | POA: Diagnosis not present

## 2017-11-10 DIAGNOSIS — E785 Hyperlipidemia, unspecified: Secondary | ICD-10-CM | POA: Diagnosis not present

## 2017-11-10 HISTORY — DX: Constipation, unspecified: K59.00

## 2017-11-10 MED ORDER — HYDROXYZINE HCL 25 MG PO TABS: 25.0000 mg | ORAL_TABLET | Freq: Every day | ORAL | 1 refills | Status: DC

## 2017-11-10 MED ORDER — ESCITALOPRAM OXALATE 10 MG PO TABS: 10.0000 mg | ORAL_TABLET | Freq: Every day | ORAL | 1 refills | Status: DC

## 2017-11-10 NOTE — Progress Notes (Signed)
Patient ID: [name], female  DOB: [birth year], 57 y.o.   MRN: [id number] Patient Care Team    Relationship Specialty Notifications Start End  [name], DO PCP - General Family Medicine  10/13/17   Constance Haw, MD Consulting Physician Cardiology  10/13/17   Rosalin Hawking, MD Consulting Physician Neurology  10/13/17   Jamse Arn, MD Consulting Physician Physical Medicine and Rehabilitation  10/13/17   Luanne Bras, MD Consulting Physician Interventional Radiology  10/13/17     Chief Complaint  Patient presents with  . Follow-up    Urticaria/Anxiety    Subjective:  [name] is a 57 y.o.  female present for  Urticaria/Stress: She is responding well to lexapro 10 mg QD (in place of prozac), allegra daily and vistaril at night. Urticaria is almost completely resolved on this regimen.  Prior note:  Patient reports she has had chronic urticaria since the hospitalization for her stroke in November. She has tried discontinuing her medications and there seems to be no offending agents. Her urticaria persisted despite her not taking her medications. Patient states she doesn't feel particularly anxious but she understands her body is going through a great deal of stress. She denies any changes in personal hygiene products.   Cerebrovascular accident (CVA) due to occlusion of left middle cerebral artery (HCC)/ Right hemiplegia (HCC)/HLD/former smoker She is doing well with neuro rehab. She I having vascular studies completed this week for LE coldness. She reports compliance and is tolerating Pepcid (only using QD), Crestor and ASA 325 mg QD. She is also taking Baclofen 5 mg TID. She is having constipation. Patient suffered from a left middle cerebral artery CVA 08/21/2017. She has completed inpatient and home health physical therapy. Right-sided upper and lower extremity deficits remain. She reports very minimal use of her right arm. She reports weakness with  her right lower extremity but is able to walk with assistance for short periods if needed. She has been prescribed aspirin 325 mg a day. She also was prescribed Lipitor, but it gave her diarrhea. She reports she has quit smoking since having a stroke.  Depression screen [facility] 2/9 11/10/2017 11/10/2017 10/13/2017 10/05/2017 10/05/2017  Decreased Interest 0 0 0 1 0  Down, Depressed, Hopeless 0 0 0 0 0  PHQ - 2 Score 0 0 0 1 0  Altered sleeping 0 - - 0 -  Tired, decreased energy 0 - - 2 -  Change in appetite 0 - - 0 -  Feeling bad or failure about yourself  0 - - 0 -  Trouble concentrating 0 - - 1 -  Moving slowly or fidgety/restless 0 - - 1 -  Suicidal thoughts 0 - - 0 -  PHQ-9 Score 0 - - 5 -  Difficult doing work/chores Not difficult at all - - Not difficult at all -   GAD 7 : Generalized Anxiety Score 11/10/2017  Nervous, Anxious, on Edge 0  Control/stop worrying 0  Worry too much - different things 0  Trouble relaxing 0  Restless 0  Easily annoyed or irritable 0  Afraid - awful might happen 0  Total GAD 7 Score 0  Anxiety Difficulty Not difficult at all          Fall Risk  11/02/2017 10/05/2017  Falls in the past year? No No   Immunization History  Administered Date(s) Administered  . Influenza,inj,Quad PF,6+ Mos 10/13/2017    No exam data present  Past Medical History:  Diagnosis Date  . Hay fever   . Stroke ([place]) 08/23/2017   right sided deficits.    Allergies  Allergen Reactions  . Lopressor [Metoprolol Tartrate]     Angioedema  . Adhesive [Tape]   . Lipitor [Atorvastatin] Diarrhea   Past Surgical History:  Procedure Laterality Date  . IR ANGIO INTRA EXTRACRAN SEL COM CAROTID INNOMINATE UNI R MOD SED  08/21/2017  . IR ANGIO VERTEBRAL SEL SUBCLAVIAN INNOMINATE UNI R MOD SED  08/21/2017  . IR PERCUTANEOUS ART THROMBECTOMY/INFUSION INTRACRANIAL INC DIAG ANGIO  08/21/2017  . IR RADIOLOGIST EVAL & MGMT  10/30/2017  . LOOP RECORDER INSERTION N/A 08/28/2017   Procedure:  LOOP RECORDER INSERTION;  Surgeon: Constance Haw, MD;  Location: [facility];  Service: Cardiovascular;  Laterality: N/A;  . RADIOLOGY WITH ANESTHESIA N/A 08/21/2017   Procedure: RADIOLOGY WITH ANESTHESIA;  Surgeon: Luanne Bras, MD;  Location: [facility];  Service: Radiology;  Laterality: N/A;  . TEE WITHOUT CARDIOVERSION N/A 08/28/2017   Procedure: TRANSESOPHAGEAL ECHOCARDIOGRAM (TEE);  Surgeon: Fay Records, MD;  Location: [facility] ENDOSCOPY;  Service: Cardiovascular;  Laterality: N/A;   Family History  Problem Relation Age of Onset  . Heart attack Father   . Hypertension Mother   . Hyperlipidemia Mother   . Diabetes type II Mother   . Hypertension Brother    Social History   Socioeconomic History  . Marital status: Married    Spouse name: Not on file  . Number of children: 2  . Years of education: Not on file  . Highest education level: Not on file  Social Needs  . Financial resource strain: Not on file  . Food insecurity - worry: Not on file  . Food insecurity - inability: Not on file  . Transportation needs - medical: Not on file  . Transportation needs - non-medical: Not on file  Occupational History  . Not on file  Tobacco Use  . Smoking status: Former Smoker    Types: Cigarettes  . Smokeless tobacco: Never Used  Substance and Sexual Activity  . Alcohol use: No    Frequency: Never  . Drug use: No  . Sexual activity: Yes    Partners: Male  Other Topics Concern  . Not on file  Social History Narrative   Married. 2 children.    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    Walks with cane or wheelchair.    Former smoker.    Drinks caffeine.    Smoke alarm in the home.   Firearms in the home.    Wears her seatbelt.    Feels safe In her relationships.    Allergies as of 11/10/2017      Reactions   Lopressor [metoprolol Tartrate]    Angioedema   Adhesive [tape]    Lipitor [atorvastatin] Diarrhea      Medication List        Accurate as of 11/10/17 10:43  AM. Always use your most recent med list.          aspirin 325 MG EC tablet Take 1 tablet (325 mg total) by mouth daily.   baclofen 10 MG tablet Commonly known as:  LIORESAL Take 0.5 tablets (5 mg total) by mouth 3 (three) times daily.   escitalopram 10 MG tablet Commonly known as:  LEXAPRO Take 1 tablet (10 mg total) by mouth daily.   famotidine 20 MG tablet Commonly known as:  PEPCID Take 1 tablet (20 mg total) by mouth 2 (two) times daily.  hydrOXYzine 25 MG tablet Commonly known as:  ATARAX/VISTARIL Take 1 tablet (25 mg total) by mouth at bedtime.   rosuvastatin 20 MG tablet Commonly known as:  CRESTOR Take 1 tablet (20 mg total) by mouth daily.       All past medical history, surgical history, allergies, family history, immunizations andmedications were updated in the EMR today and reviewed under the history and medication portions of their EMR.      No results found.   ROS: 14 pt review of systems performed and negative (unless mentioned in an HPI)  Objective: BP 132/77 (BP Location: Left Arm, Patient Position: Sitting, Cuff Size: Normal)   Pulse 98   Temp 97.6 F (36.4 C) (Oral)   Ht 5\' 1"  (1.549 m)   Wt 129 lb (58.5 kg)   LMP  (LMP Unknown)   SpO2 94%   BMI 24.37 kg/m   Gen: Afebrile. No acute distress. Well nourished. Pleasant caucasian female.  HENT: AT. [place].  MMM.  Eyes:Pupils Equal Round Reactive to light, Extraocular movements intact,  Conjunctiva without redness, discharge or icterus. CV: RRR 1/6 SM, no edema Chest: CTAB, no wheeze or crackles Neuro/msk: walking well with 4 prong cane. PERLA. EOMi. Alert. Oriented x3 Right UE hemiplegia.  Psych: Normal affect, dress and demeanor. Normal speech. Normal thought content and judgment..   Assessment/plan: [name] is a 57 y.o. female present for establishment of care. Urticaria/Anxiety - Much improved.   - Continue Pepcid 20 mg - Start daily Allegra (DC allegra-D).  - continue  Lexapro. - continue vistaril at increase dose of 25 mg QHS. - refills provided.  - f/u 6 months as long as doing well.   Cerebrovascular accident (CVA) due to occlusion of left middle cerebral artery (HCC) Right hemiplegia (HCC)/Hyperlipidemia, unspecified hyperlipidemia type/former smoker - doing well.  - Congratulated her on smoking cessation. She hasn't had a cigarette since her stroke. - Restart aspirin 325 daily. - BP borderline--> however she has been taking pseudoephedrine daily. She will stop using and BP will likely respond.  - tolerating rosuvastatin 20 mg daily, continue.  - Patient is established with neurology and cardiology. Follow-up routinely as advised with her specialist. - Continue neurological rehabilitation outpatient   Constipation:  - possibly SE to baclofen use. Which she was encouraged to continue.  - pt was instructed to start Miralax (OTC) 1/2 cap daily. Taper dose by 1/4 cap up/down depending on BM. Goal soft but formed daily BM.   Return in about 6 months (around 05/10/2018).   Note is dictated utilizing voice recognition software. Although note has been proof read prior to signing, occasional typographical errors still can be missed. If any questions arise, please do not hesitate to call for verification.  Electronically signed by: Howard Pouch, DO [place]

## 2017-11-10 NOTE — Patient Instructions (Addendum)
miralax 1/2 cap daily with water. (over the counter)--> In a full glass of water.  Vistaril (hydroxine) ONE pill only--> dose is 25 mg now.  Avoid using and "decongestant" with ephedrine or pseudoephedrine.  Follow up in 6 months as long as doing well.     Please help [place] help you:  We are honored you have chosen [facility] for your Primary Care home. Below you will find basic instructions that you may need to access in the future. Please help [place] help you by reading the instructions, which cover many of the frequent questions we experience.   Prescription refills and request:  -In order to allow more efficient response time, please call your pharmacy for all refills. They will forward the request electronically to [place]. This allows for the quickest possible response. Request left on a nurse line can take longer to refill, since these are checked as time allows between office patients and other phone calls.  - refill request can take up to 3-5 working days to complete.  - If request is sent electronically and request is appropiate, it is usually completed in 1-2 business days.  - all patients will need to be seen routinely for all chronic medical conditions requiring prescription medications (see follow-up below). If you are overdue for follow up on your condition, you will be asked to make an appointment and we will call in enough medication to cover you until your appointment (up to 30 days).  - all controlled substances will require a face to face visit to request/refill.  - if you desire your prescriptions to go through a new pharmacy, and have an active script at original pharmacy, you will need to call your pharmacy and have scripts transferred to new pharmacy. This is completed between the pharmacy locations and not by your provider.    Results: If any images or labs were ordered, it can take up to 1 week to get results depending on the test ordered and the lab/facility running and  resulting the test. - Normal or stable results, which do not need further discussion, may be released to your mychart immediately with attached note to you. A call may not be generated for normal results. Please make certain to sign up for mychart. If you have questions on how to activate your mychart you can call the front office.  - If your results need further discussion, our office will attempt to contact you via phone, and if unable to reach you after 2 attempts, we will release your abnormal result to your mychart with instructions.  - All results will be automatically released in mychart after 1 week.  - Your provider will provide you with explanation and instruction on all relevant material in your results. Please keep in mind, results and labs may appear confusing or abnormal to the untrained eye, but it does not mean they are actually abnormal for you personally. If you have any questions about your results that are not covered, or you desire more detailed explanation than what was provided, you should make an appointment with your provider to do so.   Our office handles many outgoing and incoming calls daily. If we have not contacted you within 1 week about your results, please check your mychart to see if there is a message first and if not, then contact our office.  In helping with this matter, you help decrease call volume, and therefore allow [place] to be able to respond to patients needs more  efficiently.   Acute office visits (sick visit):  An acute visit is intended for a new problem and are scheduled in shorter time slots to allow schedule openings for patients with new problems. This is the appropriate visit to discuss a new problem. In order to provide you with excellent quality medical care with proper time for you to explain your problem, have an exam and receive treatment with instructions, these appointments should be limited to one new problem per visit. If you experience a new  problem, in which you desire to be addressed, please make an acute office visit, we save openings on the schedule to accommodate you. Please do not save your new problem for any other type of visit, let us take care of it properly and quickly for you.   Follow up visits:  Depending on your condition(s) your provider will need to see you routinely in order to provide you with quality care and prescribe medication(s). Most chronic conditions (Example: hypertension, Diabetes, depression/anxiety... etc), require visits a couple times a year. Your provider will instruct you on proper follow up for your personal medical conditions and history. Please make certain to make follow up appointments for your condition as instructed. Failing to do so could result in lapse in your medication treatment/refills. If you request a refill, and are overdue to be seen on a condition, we will always provide you with a 30 day script (once) to allow you time to schedule.    Medicare wellness (well visit): - we have a wonderful Nurse Maudie Mercury), that will meet with you and provide you will yearly medicare wellness visits. These visits should occur yearly (can not be scheduled less than 1 calendar year apart) and cover preventive health, immunizations, advance directives and screenings you are entitled to yearly through your medicare benefits. Do not miss out on your entitled benefits, this is when medicare will pay for these benefits to be ordered for you.  These are strongly encouraged by your provider and is the appropriate type of visit to make certain you are up to date with all preventive health benefits. If you have not had your medicare wellness exam in the last 12 months, please make certain to schedule one by calling the office and schedule your medicare wellness with Maudie Mercury as soon as possible.   Yearly physical (well visit):  - Adults are recommended to be seen yearly for physicals. Check with your insurance and date of your  last physical, most insurances require one calendar year between physicals. Physicals include all preventive health topics, screenings, medical exam and labs that are appropriate for gender/age and history. You may have fasting labs needed at this visit. This is a well visit (not a sick visit), new problems should not be covered during this visit (see acute visit).  - Pediatric patients are seen more frequently when they are younger. Your provider will advise you on well child visit timing that is appropriate for your their age. - This is not a medicare wellness visit. Medicare wellness exams do not have an exam portion to the visit. Some medicare companies allow for a physical, some do not allow a yearly physical. If your medicare allows a yearly physical you can schedule the medicare wellness with our nurse Maudie Mercury and have your physical with your provider after, on the same day. Please check with insurance for your full benefits.   Late Policy/No Shows:  - all new patients should arrive 15-30 minutes earlier than appointment to allow [place]  time  to  obtain all personal demographics,  insurance information and for you to complete office paperwork. - All established patients should arrive 10-15 minutes earlier than appointment time to update all information and be checked in .  - In our best efforts to run on time, if you are late for your appointment you will be asked to either reschedule or if able, we will work you back into the schedule. There will be a wait time to work you back in the schedule,  depending on availability.  - If you are unable to make it to your appointment as scheduled, please call 24 hours ahead of time to allow [place] to fill the time slot with someone else who needs to be seen. If you do not cancel your appointment ahead of time, you may be charged a no show fee.

## 2017-11-13 ENCOUNTER — Ambulatory Visit: Payer: BLUE CROSS/BLUE SHIELD | Admitting: Physical Therapy

## 2017-11-13 ENCOUNTER — Ambulatory Visit: Payer: BLUE CROSS/BLUE SHIELD | Admitting: Occupational Therapy

## 2017-11-13 ENCOUNTER — Encounter: Payer: Self-pay | Admitting: Physical Therapy

## 2017-11-13 DIAGNOSIS — R293 Abnormal posture: Secondary | ICD-10-CM | POA: Diagnosis not present

## 2017-11-13 DIAGNOSIS — R2681 Unsteadiness on feet: Secondary | ICD-10-CM

## 2017-11-13 DIAGNOSIS — I69351 Hemiplegia and hemiparesis following cerebral infarction affecting right dominant side: Secondary | ICD-10-CM

## 2017-11-13 DIAGNOSIS — M6281 Muscle weakness (generalized): Secondary | ICD-10-CM

## 2017-11-13 DIAGNOSIS — R600 Localized edema: Secondary | ICD-10-CM | POA: Diagnosis not present

## 2017-11-13 DIAGNOSIS — R2689 Other abnormalities of gait and mobility: Secondary | ICD-10-CM

## 2017-11-13 DIAGNOSIS — R278 Other lack of coordination: Secondary | ICD-10-CM | POA: Diagnosis not present

## 2017-11-13 NOTE — Therapy (Signed)
[facility] [address] [place], [place], [address] Phone: [phone]   Fax:  [phone]  Physical Therapy Treatment  Patient Details  Name: [name] MRN: [id number] Date of Birth: 29-Jun-1961 Referring Provider: Dr. Howard Pouch   Encounter Date: 11/13/2017  PT End of Session - 11/13/17 1058    Visit Number  6    Number of Visits  16    Date for PT Re-Evaluation  12/20/17    Authorization Type  BCBS    Authorization Time Period  24 visits combined:     PT Start Time  1015    PT Stop Time  1057    PT Time Calculation (min)  42 min    Activity Tolerance  Patient tolerated treatment well    Behavior During Therapy  [facility] for tasks assessed/performed       Past Medical History:  Diagnosis Date  . Hay fever   . Stroke ([place]) 08/23/2017   right sided deficits.     Past Surgical History:  Procedure Laterality Date  . IR ANGIO INTRA EXTRACRAN SEL COM CAROTID INNOMINATE UNI R MOD SED  08/21/2017  . IR ANGIO VERTEBRAL SEL SUBCLAVIAN INNOMINATE UNI R MOD SED  08/21/2017  . IR PERCUTANEOUS ART THROMBECTOMY/INFUSION INTRACRANIAL INC DIAG ANGIO  08/21/2017  . IR RADIOLOGIST EVAL & MGMT  10/30/2017  . LOOP RECORDER INSERTION N/A 08/28/2017   Procedure: LOOP RECORDER INSERTION;  Surgeon: Constance Haw, MD;  Location: [facility];  Service: Cardiovascular;  Laterality: N/A;  . RADIOLOGY WITH ANESTHESIA N/A 08/21/2017   Procedure: RADIOLOGY WITH ANESTHESIA;  Surgeon: Luanne Bras, MD;  Location: [facility];  Service: Radiology;  Laterality: N/A;  . TEE WITHOUT CARDIOVERSION N/A 08/28/2017   Procedure: TRANSESOPHAGEAL ECHOCARDIOGRAM (TEE);  Surgeon: Fay Records, MD;  Location: [facility];  Service: Cardiovascular;  Laterality: N/A;    There were no vitals filed for this visit.  Subjective Assessment - 11/13/17 1019    Subjective  scratched her head this morning, and had a hard time stopping her scalp  from bleeding.    Patient Stated Goals  "make my legs look more straight," improve mobility and balance, work on stairs    Currently in Pain?  No/denies                      [facility] Adult PT Treatment/Exercise - 11/13/17 1022      Knee/Hip Exercises: Stretches   Gastroc Stretch  Both;2 reps;30 seconds min cues for technique      Knee/Hip Exercises: Seated   Other Seated Knee/Hip Exercises  Rt hip IR x 10 with yellow theraband and mod cues for technique      Knee/Hip Exercises: Supine   Bridges with [employer]  Both;2 sets;10 reps    Other Supine Knee/Hip Exercises  hooklying adduction 2x10 yellow theraband      Knee/Hip Exercises: Sidelying   Other Sidelying Knee/Hip Exercises  reverse clam with PT assisted lifting foot 2x10 reps      Knee/Hip Exercises: Prone   Hamstring Curl  2 sets;10 reps cues to decrease substitution             PT Education - 11/13/17 1058    Education provided  Yes    Education Details  seated gastroc stretch    Person(s) Educated  Patient;Spouse    Methods  Explanation;Demonstration;Handout    Comprehension  Verbalized understanding;Returned demonstration;Need further instruction  PT Short Term Goals - 10/25/17 1417      PT SHORT TERM GOAL #1   Title  verbalize understanding of CVA risk factors/warning signs    Status  New    Target Date  11/22/17      PT SHORT TERM GOAL #2   Title  amb > 350' with LRAD on indoor/paved outdoor surfaces modified independent for improved function    Status  New    Target Date  11/22/17      PT SHORT TERM GOAL #3   Title  improve timed up and go to < 25 sec with LRAD for improved mobility    Status  New    Target Date  11/22/17      PT SHORT TERM GOAL #4   Title  improve gait velocity to at least 1.0 ft/sec for improved mobility    Status  New    Target Date  11/22/17        PT Long Term Goals - 10/25/17 1429      PT LONG TERM GOAL #1   Title  independent with HEP    Status   New    Target Date  12/20/17      PT LONG TERM GOAL #2   Title  improve BERG balance score to >/= 46/56 for improved balance and decreased fall risk    Status  New    Target Date  12/20/17      PT LONG TERM GOAL #3   Title  improve timed up and go to < 20 sec for improved mobility    Status  New    Target Date  12/20/17      PT LONG TERM GOAL #4   Title  improve gait velocity to > 1.8 ft/sec for decreased fall risk    Status  New    Target Date  12/20/17      PT LONG TERM GOAL #5   Title  amb > 500' on various indoor/outdoor surfaces modified independent with LRAD for 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  Tolerated session well and will continue to benefit from PT to maximize function.  Has doppler Thursday to assess for RLE blood flow.    PT Treatment/Interventions  ADLs/Self Care Home Management;Cryotherapy;Electrical Stimulation;Moist Heat;Therapeutic exercise;Therapeutic activities;Functional mobility training;Stair training;Gait training;DME Instruction;Neuromuscular re-education;Balance training;Patient/family education;Orthotic Fit/Training;Manual techniques;Vestibular;Taping    PT Next Visit Plan  review gastroc stretch; continue with strengthen R hip IR, glutes, hamstring.  SLS and weight shifting.  RLE NMR, continue with use of foot up brace, ? need for additional knee brace    Consulted and Agree with Plan of Care  Patient;Family member/caregiver    Family Member Consulted  husband       Patient will benefit from skilled therapeutic intervention in order to improve the following deficits and impairments:  Abnormal gait, Decreased strength, Decreased balance, Difficulty walking, Decreased mobility, Impaired perceived functional ability  Visit  Diagnosis: Hemiplegia and hemiparesis following cerebral infarction affecting right dominant side (HCC)  Muscle weakness (generalized)  Other abnormalities of gait and mobility  Unsteadiness on feet  Abnormal posture     Problem List Patient Active Problem List   Diagnosis  Date Noted  . Constipation 11/10/2017  . Stress and adjustment reaction 10/13/2017  . Urticaria 10/13/2017  . Former smoker 10/13/2017  . Cerebrovascular accident (CVA) due to occlusion of left middle cerebral artery ([place])   . Right hemiplegia ([place])   . Dysphagia, post-stroke   . Hyperlipidemia       Laureen Abrahams, PT, DPT 11/13/17 11:29 AM    [facility] [address] [place], [place], [address] Phone: [phone]   Fax:  [phone]  Name: [name] MRN: [id number] Date of Birth: 1961/09/13

## 2017-11-13 NOTE — Therapy (Signed)
[facility] [address] [place], [place], [address] Phone: [phone]   Fax:  [phone]  Occupational Therapy Treatment  Patient Details  Name: [name] MRN: [id number] Date of Birth: Oct 27, 1960 Referring Provider: Dr. Howard Pouch   Encounter Date: 11/13/2017  OT End of Session - 11/13/17 1203    Visit Number  6    Number of Visits  25    Date for OT Re-Evaluation  01/17/18    Authorization Type  BC/BS    Authorization - Visit Number  6    Authorization - Number of Visits  24    OT Start Time  1100    OT Stop Time  1145    OT Time Calculation (min)  45 min    Activity Tolerance  Patient tolerated treatment well       Past Medical History:  Diagnosis Date  . Hay fever   . Stroke ([facility]) 08/23/2017   right sided deficits.     Past Surgical History:  Procedure Laterality Date  . IR ANGIO INTRA EXTRACRAN SEL COM CAROTID INNOMINATE UNI R MOD SED  08/21/2017  . IR ANGIO VERTEBRAL SEL SUBCLAVIAN INNOMINATE UNI R MOD SED  08/21/2017  . IR PERCUTANEOUS ART THROMBECTOMY/INFUSION INTRACRANIAL INC DIAG ANGIO  08/21/2017  . IR RADIOLOGIST EVAL & MGMT  10/30/2017  . LOOP RECORDER INSERTION N/A 08/28/2017   Procedure: LOOP RECORDER INSERTION;  Surgeon: Constance Haw, MD;  Location: [facility];  Service: Cardiovascular;  Laterality: N/A;  . RADIOLOGY WITH ANESTHESIA N/A 08/21/2017   Procedure: RADIOLOGY WITH ANESTHESIA;  Surgeon: Luanne Bras, MD;  Location: [facility];  Service: Radiology;  Laterality: N/A;  . TEE WITHOUT CARDIOVERSION N/A 08/28/2017   Procedure: TRANSESOPHAGEAL ECHOCARDIOGRAM (TEE);  Surgeon: Fay Records, MD;  Location: [facility];  Service: Cardiovascular;  Laterality: N/A;    There were no vitals filed for this visit.  Subjective Assessment - 11/13/17 1102    Subjective   The splint is working well (re: resting hand splint)    Limitations  *loop recorder, fall risk, no  driving    Patient Stated Goals  Get my arm working    Currently in Pain?  No/denies                   OT Treatments/Exercises (OP) - 11/13/17 0001      ADLs   ADL Comments  Pt/husband shown rocker knife and pt return demo. Pt/husband instructed in where to purchase. Also discussed one handed cutting board and told how to purchase       Neurological Re-education Exercises   Other Exercises 1  AA/ROM with tilted stool for low range closed chain activation with min to mod facilitation    Other Weight-Bearing Exercises 1  Seated: wt bearing over Rt elbow with min support provided to give proprioceptive feedback      Resting hand splint appears to be fitting well, with no problems         OT Short Term Goals - 11/13/17 1204      OT SHORT TERM GOAL #1   Title  Independent with initial HEP     Time  6    Period  Weeks    Status  On-going      OT SHORT TERM GOAL #2   Title  Pt to demo 25 degrees shoulder flexion in prep for low level reaching    Time  6    Period  Weeks    Status  On-going      OT SHORT TERM GOAL #3   Title  Pt to demo 50% finger flexion and 25% finger extension in prep for functional grasp/release    Time  6    Period  Weeks    Status  New      OT SHORT TERM GOAL #4   Title  Pt/family to verbalize understanding with edema management strategies Rt hand     Time  6    Period  Weeks    Status  New      OT SHORT TERM GOAL #5   Title  Pt to verbalize understanding with A/E needs to increased independence with ADLS/IADLS    Time  6    Period  Weeks    Status  On-going        OT Long Term Goals - 10/25/17 1245      OT LONG TERM GOAL #1   Title  Independent with updated HEP    Time  12    Period  Weeks    Status  New    Target Date  01/17/18      OT LONG TERM GOAL #2   Title  Pt to demo functional use of Rt hand as evidenced by performing 8 blocks on Box & Blocks test    Time  12    Period  Weeks    Status  New      OT LONG  TERM GOAL #3   Title  Pt to demo 40 degrees shoulder flexion RUE    Time  12    Period  Weeks    Status  New      OT LONG TERM GOAL #4   Title  Pt to demo 75% finger flexion and 50% finger extension Rt hand    Time  12    Period  Weeks    Status  New      OT LONG TERM GOAL #5   Title  Pt to cook meal at mod I level using A/E PRN    Time  12    Period  Weeks    Status  New      Long Term Additional Goals   Additional Long Term Goals  Yes      OT LONG TERM GOAL #6   Title  Pt to perform light housework (laundry, cleaning) at mod I level    Time  12    Period  Weeks    Status  New            Plan - 11/13/17 1205    Clinical Impression Statement  Pt progressing towards goals. Pt w/ decreased proprioception RUE and ? apraxia.     Rehab Potential  Good    OT Frequency  2x / week    OT Duration  12 weeks    OT Treatment/Interventions  Self-care/ADL training;Moist Heat;Fluidtherapy;DME and/or AE instruction;Splinting;Aquatic Therapy;Therapeutic activities;Compression bandaging;Other (comment);Therapeutic exercise;Neuromuscular education;Functional Mobility Training;Passive range of motion;Visual/perceptual remediation/compensation;Manual Therapy;Electrical Stimulation;Energy conservation;Patient/family education    Plan  continue NMR, progress towards remaining STG's.    Consulted and Agree with Plan of Care  Patient    Family Member Consulted  husband       Patient will benefit from skilled therapeutic intervention in order to improve the following deficits and impairments:  Decreased coordination, Decreased range of motion, Difficulty walking, Decreased endurance, Impaired tone, Decreased activity tolerance, Decreased knowledge of precautions, Decreased balance, Decreased  knowledge of use of DME, Impaired UE functional use, Pain, Decreased mobility, Decreased strength  Visit Diagnosis: Hemiplegia and hemiparesis following cerebral infarction affecting right dominant side  (HCC)  Muscle weakness (generalized)    Problem List Patient Active Problem List   Diagnosis Date Noted  . Constipation 11/10/2017  . Stress and adjustment reaction 10/13/2017  . Urticaria 10/13/2017  . Former smoker 10/13/2017  . Cerebrovascular accident (CVA) due to occlusion of left middle cerebral artery ([facility])   . Right hemiplegia ([facility])   . Dysphagia, post-stroke   . Hyperlipidemia     Carey Bullocks, OTR/L 11/13/2017, 12:07 PM  [facility] [address] [place], [place], [address] Phone: [phone]   Fax:  [phone]  Name: [name] MRN: [id number] Date of Birth: Jul 18, 1961

## 2017-11-13 NOTE — Patient Instructions (Signed)
Gastroc / Heel Cord Stretch - Seated With Towel    Sit in chair with heel resting on floor, towel or strap around ball of foot. Gently pull foot in toward body, stretching heel cord and calf. Hold for _30__ seconds. Repeat on other leg. Repeat _2-3_ times. Do _2__ times per day.  Copyright  VHI. All rights reserved.

## 2017-11-15 ENCOUNTER — Ambulatory Visit: Payer: BLUE CROSS/BLUE SHIELD | Admitting: Occupational Therapy

## 2017-11-15 ENCOUNTER — Encounter: Payer: Self-pay | Admitting: Occupational Therapy

## 2017-11-15 ENCOUNTER — Ambulatory Visit: Payer: BLUE CROSS/BLUE SHIELD | Admitting: Physical Therapy

## 2017-11-15 ENCOUNTER — Encounter: Payer: Self-pay | Admitting: Physical Therapy

## 2017-11-15 DIAGNOSIS — R293 Abnormal posture: Secondary | ICD-10-CM

## 2017-11-15 DIAGNOSIS — I69351 Hemiplegia and hemiparesis following cerebral infarction affecting right dominant side: Secondary | ICD-10-CM

## 2017-11-15 DIAGNOSIS — R2681 Unsteadiness on feet: Secondary | ICD-10-CM

## 2017-11-15 DIAGNOSIS — R600 Localized edema: Secondary | ICD-10-CM

## 2017-11-15 DIAGNOSIS — R278 Other lack of coordination: Secondary | ICD-10-CM

## 2017-11-15 DIAGNOSIS — M6281 Muscle weakness (generalized): Secondary | ICD-10-CM

## 2017-11-15 DIAGNOSIS — R2689 Other abnormalities of gait and mobility: Secondary | ICD-10-CM | POA: Diagnosis not present

## 2017-11-15 NOTE — Therapy (Signed)
[facility] [address] [place], [place], [address] Phone: [phone]   Fax:  [phone]  Occupational Therapy Treatment  Patient Details  Name: [name] MRN: [id number] Date of Birth: 11/17/1960 Referring Provider: Dr. Howard Pouch   Encounter Date: 11/15/2017  OT End of Session - 11/15/17 1149    Visit Number  7    Number of Visits  25    Date for OT Re-Evaluation  01/17/18    Authorization Type  BC/BS    Authorization - Visit Number  7    Authorization - Number of Visits  24    OT Start Time  9476    OT Stop Time  1146    OT Time Calculation (min)  43 min    Activity Tolerance  Patient tolerated treatment well    Behavior During Therapy  [facility] for tasks assessed/performed       Past Medical History:  Diagnosis Date  . Hay fever   . Stroke ([place]) 08/23/2017   right sided deficits.     Past Surgical History:  Procedure Laterality Date  . IR ANGIO INTRA EXTRACRAN SEL COM CAROTID INNOMINATE UNI R MOD SED  08/21/2017  . IR ANGIO VERTEBRAL SEL SUBCLAVIAN INNOMINATE UNI R MOD SED  08/21/2017  . IR PERCUTANEOUS ART THROMBECTOMY/INFUSION INTRACRANIAL INC DIAG ANGIO  08/21/2017  . IR RADIOLOGIST EVAL & MGMT  10/30/2017  . LOOP RECORDER INSERTION N/A 08/28/2017   Procedure: LOOP RECORDER INSERTION;  Surgeon: Constance Haw, MD;  Location: [facility];  Service: Cardiovascular;  Laterality: N/A;  . RADIOLOGY WITH ANESTHESIA N/A 08/21/2017   Procedure: RADIOLOGY WITH ANESTHESIA;  Surgeon: Luanne Bras, MD;  Location: [facility];  Service: Radiology;  Laterality: N/A;  . TEE WITHOUT CARDIOVERSION N/A 08/28/2017   Procedure: TRANSESOPHAGEAL ECHOCARDIOGRAM (TEE);  Surgeon: Fay Records, MD;  Location: [facility];  Service: Cardiovascular;  Laterality: N/A;    There were no vitals filed for this visit.  Subjective Assessment - 11/15/17 1110    Subjective   My husband is padding the wheelchair so I  don't keep bruising my arm.      Limitations  *loop recorder, fall risk, no driving    Patient Stated Goals  Get my arm working    Currently in Pain?  No/denies    Pain Score  0-No pain                   OT Treatments/Exercises (OP) - 11/15/17 0001      Neurological Re-education Exercises   Other Exercises 1  Neuromuscular reeducation to address  improved active motion in shoulder , elbow, forearm, and wrist in supine.  Patient needs facilitation to reduce extraneous trunk, head and neck motion.      Other Exercises 2  Patient with nice carryover of edema management techniques - wearing splint, massage for digits and positioning.               OT Education - 11/15/17 1148    Education provided  Yes    Education Details  edema management techniques    Person(s) Educated  Patient    Methods  Explanation;Demonstration    Comprehension  Verbalized understanding;Need further instruction       OT Short Term Goals - 11/13/17 1204      OT SHORT TERM GOAL #1   Title  Independent with initial HEP     Time  6    Period  Weeks    Status  On-going      OT SHORT TERM GOAL #2   Title  Pt to demo 25 degrees shoulder flexion in prep for low level reaching    Time  6    Period  Weeks    Status  On-going      OT SHORT TERM GOAL #3   Title  Pt to demo 50% finger flexion and 25% finger extension in prep for functional grasp/release    Time  6    Period  Weeks    Status  New      OT SHORT TERM GOAL #4   Title  Pt/family to verbalize understanding with edema management strategies Rt hand     Time  6    Period  Weeks    Status  New      OT SHORT TERM GOAL #5   Title  Pt to verbalize understanding with A/E needs to increased independence with ADLS/IADLS    Time  6    Period  Weeks    Status  On-going        OT Long Term Goals - 10/25/17 1245      OT LONG TERM GOAL #1   Title  Independent with updated HEP    Time  12    Period  Weeks    Status  New    Target  Date  01/17/18      OT LONG TERM GOAL #2   Title  Pt to demo functional use of Rt hand as evidenced by performing 8 blocks on Box & Blocks test    Time  12    Period  Weeks    Status  New      OT LONG TERM GOAL #3   Title  Pt to demo 40 degrees shoulder flexion RUE    Time  12    Period  Weeks    Status  New      OT LONG TERM GOAL #4   Title  Pt to demo 75% finger flexion and 50% finger extension Rt hand    Time  12    Period  Weeks    Status  New      OT LONG TERM GOAL #5   Title  Pt to cook meal at mod I level using A/E PRN    Time  12    Period  Weeks    Status  New      Long Term Additional Goals   Additional Long Term Goals  Yes      OT LONG TERM GOAL #6   Title  Pt to perform light housework (laundry, cleaning) at mod I level    Time  12    Period  Weeks    Status  New            Plan - 11/15/17 1149    Clinical Impression Statement  Pt progressing towards goals. Pt w/ decreased proprioception RUE and ? apraxia.     Occupational Profile and client history currently impacting functional performance  No significant PMH, but current deficits from stroke are preventing patient to perform IADLS, caring for elderly mother, and social participation    Occupational performance deficits (Please refer to evaluation for details):  ADL's;IADL's;Social Participation;Other    Rehab Potential  Good    OT Frequency  2x / week    OT Duration  12 weeks    OT Treatment/Interventions  Self-care/ADL training;Moist Heat;Fluidtherapy;DME and/or  AE instruction;Splinting;Aquatic Therapy;Therapeutic activities;Compression bandaging;Other (comment);Therapeutic exercise;Neuromuscular education;Functional Mobility Training;Passive range of motion;Visual/perceptual remediation/compensation;Manual Therapy;Electrical Stimulation;Energy conservation;Patient/family education    Plan  continue NMR, progress towards remaining STG's.    Clinical Decision Making  Several treatment options, min-mod  task modification necessary    Consulted and Agree with Plan of Care  Patient       Patient will benefit from skilled therapeutic intervention in order to improve the following deficits and impairments:  Decreased coordination, Decreased range of motion, Difficulty walking, Decreased endurance, Impaired tone, Decreased activity tolerance, Decreased knowledge of precautions, Decreased balance, Decreased knowledge of use of DME, Impaired UE functional use, Pain, Decreased mobility, Decreased strength  Visit Diagnosis: Hemiplegia and hemiparesis following cerebral infarction affecting right dominant side (HCC)  Abnormal posture  Other lack of coordination  Muscle weakness (generalized)  Unsteadiness on feet  Localized edema    Problem List Patient Active Problem List   Diagnosis Date Noted  . Constipation 11/10/2017  . Stress and adjustment reaction 10/13/2017  . Urticaria 10/13/2017  . Former smoker 10/13/2017  . Cerebrovascular accident (CVA) due to occlusion of left middle cerebral artery ([facility])   . Right hemiplegia ([facility])   . Dysphagia, post-stroke   . Hyperlipidemia     Mariah Milling, OTR/L 11/15/2017, 11:50 AM  [facility] [address] [place], [place], [address] Phone: [phone]   Fax:  [phone]  Name: SHEKELA GOODRIDGE MRN: [id number] Date of Birth: 1961-09-23

## 2017-11-15 NOTE — Therapy (Addendum)
[facility] [address] [place], [place], [address] Phone: [phone]   Fax:  [phone]  Physical Therapy Treatment/Discharge  Patient Details  Name: [name] MRN: [id number] Date of Birth: 1961-04-10 Referring Provider: Dr. Howard Pouch   Encounter Date: 11/15/2017  PT End of Session - 11/15/17 1154    Visit Number  7    Number of Visits  16    Date for PT Re-Evaluation  12/20/17    Authorization Type  BCBS    Authorization Time Period  24 visits combined:     PT Start Time  1148    PT Stop Time  1230    PT Time Calculation (min)  42 min    Activity Tolerance  Patient tolerated treatment well    Behavior During Therapy  [facility] for tasks assessed/performed       Past Medical History:  Diagnosis Date  . Hay fever   . Stroke ([facility]) 08/23/2017   right sided deficits.     Past Surgical History:  Procedure Laterality Date  . IR ANGIO INTRA EXTRACRAN SEL COM CAROTID INNOMINATE UNI R MOD SED  08/21/2017  . IR ANGIO VERTEBRAL SEL SUBCLAVIAN INNOMINATE UNI R MOD SED  08/21/2017  . IR PERCUTANEOUS ART THROMBECTOMY/INFUSION INTRACRANIAL INC DIAG ANGIO  08/21/2017  . IR RADIOLOGIST EVAL & MGMT  10/30/2017  . LOOP RECORDER INSERTION N/A 08/28/2017   Procedure: LOOP RECORDER INSERTION;  Surgeon: Constance Haw, MD;  Location: [facility];  Service: Cardiovascular;  Laterality: N/A;  . RADIOLOGY WITH ANESTHESIA N/A 08/21/2017   Procedure: RADIOLOGY WITH ANESTHESIA;  Surgeon: Luanne Bras, MD;  Location: [facility];  Service: Radiology;  Laterality: N/A;  . TEE WITHOUT CARDIOVERSION N/A 08/28/2017   Procedure: TRANSESOPHAGEAL ECHOCARDIOGRAM (TEE);  Surgeon: Fay Records, MD;  Location: [facility];  Service: Cardiovascular;  Laterality: N/A;    There were no vitals filed for this visit.  Subjective Assessment - 11/15/17 1152    Subjective  No new complaints. No falls. Was sore after last session.      Patient is accompained by:  Family member    Limitations  Walking    Patient Stated Goals  "make my legs look more straight," improve mobility and balance, work on stairs    Currently in Pain?  No/denies    Pain Score  0-No pain          [facility] Adult PT Treatment/Exercise - 11/15/17 1154      Knee/Hip Exercises: Stretches   Gastroc Stretch  Both;2 reps;30 seconds    Gastroc Stretch Limitations  seated at edge of mat with cues on form, hold time and technique      Knee/Hip Exercises: Supine   Bridges  AROM;Strengthening;Both;1 set;10 reps;Limitations    Bridges Limitations  arms across chest for all: bil LE bridge for 5 sec hold x 10 reps, progressing to bridging with ball squeeze for 5 sec holds x 10 reps, then bridging concurrent with holding band taunt x 10 reps. all needed cues on ex form/technique. right single leg bridge with cues for level hip lifting. cues needed with all bridging ex's for correct form and technique.                             Bridges with Diona Foley Squeeze  AROM;Strengthening;Both;10 reps;2 sets    Bridges with Clamshell  AROM;Right;Both;2 sets;10 reps;Limitations holding band taunt    Single  Leg Bridge  AROM;Strengthening;Right;2 sets;10 reps;Limitations    Other Supine Knee/Hip Exercises  hooklying position: right hip fall out with red band resistance 2 sets of 10 reps. manual assistance with cues to decrease compensatory movements       Knee/Hip Exercises: Prone   Hamstring Curl  10 reps;Limitations;2 sets    Hamstring Curl Limitations  with yellow band resistance and assist to control the lowering down toward mat.     Hip Extension  AROM;Strengthening;Right;AAROM;1 set;10 reps;Limitations    Hip Extension Limitations  with knee flexion for glut kick backs, assistance needed for form and to decrease compensatory     Straight Leg Raises  AROM;AAROM;Strengthening;Left;2 sets;10 reps;Limitations    Straight Leg Raises Limitations  with pillow under abodmen for lumbar  support, active with lifitng leg, needed assist to stabilize pelvis/decrease compensatory movements           PT Short Term Goals - 10/25/17 1417      PT SHORT TERM GOAL #1   Title  verbalize understanding of CVA risk factors/warning signs    Status  New    Target Date  11/22/17      PT SHORT TERM GOAL #2   Title  amb > 350' with LRAD on indoor/paved outdoor surfaces modified independent for improved function    Status  New    Target Date  11/22/17      PT SHORT TERM GOAL #3   Title  improve timed up and go to < 25 sec with LRAD for improved mobility    Status  New    Target Date  11/22/17      PT SHORT TERM GOAL #4   Title  improve gait velocity to at least 1.0 ft/sec for improved mobility    Status  New    Target Date  11/22/17        PT Long Term Goals - 10/25/17 1429      PT LONG TERM GOAL #1   Title  independent with HEP    Status  New    Target Date  12/20/17      PT LONG TERM GOAL #2   Title  improve BERG balance score to >/= 46/56 for improved balance and decreased fall risk    Status  New    Target Date  12/20/17      PT LONG TERM GOAL #3   Title  improve timed up and go to < 20 sec for improved mobility    Status  New    Target Date  12/20/17      PT LONG TERM GOAL #4   Title  improve gait velocity to > 1.8 ft/sec for decreased fall risk    Status  New    Target Date  12/20/17      PT LONG TERM GOAL #5   Title  amb > 500' on various indoor/outdoor surfaces modified independent with LRAD for 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 Manual assistance needed for stability and to decrease compensatory mm movements. Pt is progressing toward goals and should benefit from continued PT to progress toward unmet goals.     Rehab Potential  Good     PT Frequency  2x / week    PT Duration  8 weeks    PT Treatment/Interventions  ADLs/Self Care Home Management;Cryotherapy;Electrical Stimulation;Moist Heat;Therapeutic exercise;Therapeutic activities;Functional mobility training;Stair training;Gait training;DME Instruction;Neuromuscular re-education;Balance training;Patient/family education;Orthotic Fit/Training;Manual techniques;Vestibular;Taping    PT Next Visit Plan  continue with strengthen R hip IR, glutes, hamstring.  SLS and weight shifting.  RLE NMR, continue with use of foot up brace, ? need for additional knee brace    Consulted and Agree with Plan of Care  Patient;Family member/caregiver    Family Member Consulted  husband       Patient will benefit from skilled therapeutic intervention in order to improve the following deficits and impairments:  Abnormal gait, Decreased strength, Decreased balance, Difficulty walking, Decreased mobility, Impaired perceived functional ability  Visit Diagnosis: Hemiplegia and hemiparesis following cerebral infarction affecting right dominant side (HCC)  Muscle weakness (generalized)  Unsteadiness on feet     Problem List Patient Active Problem List   Diagnosis Date Noted  . Constipation 11/10/2017  . Stress and adjustment reaction 10/13/2017  . Urticaria 10/13/2017  . Former smoker 10/13/2017  . Cerebrovascular accident (CVA) due to occlusion of left middle cerebral artery ([facility] [facility])   . Right hemiplegia ([facility])   . Dysphagia, post-stroke   . Hyperlipidemia     [place], [place], [place] [address], [place],  [address] [phone] 11/15/17, 10:32 PM   Name: [name] MRN: [id number] Date of Birth: [birth year]      PHYSICAL THERAPY DISCHARGE SUMMARY  Visits from Start of Care: 7  Current functional level related to goals / functional outcomes: See above   Remaining deficits: Unknown; pt admitted with PNA and subsequent CABG;  pending HHPT at d/c   Education / Equipment: HEP  Plan: Patient agrees to discharge.  Patient goals were not met. Patient is being discharged due to a change in medical status.  ?????     Laureen Abrahams, PT, DPT 11/27/17 12:35 PM  [facility] [address] [place],  [address]  [phone] (office) [phone] (fax)

## 2017-11-16 ENCOUNTER — Ambulatory Visit (HOSPITAL_COMMUNITY): Payer: BLUE CROSS/BLUE SHIELD

## 2017-11-16 ENCOUNTER — Inpatient Hospital Stay (HOSPITAL_COMMUNITY)
Admission: EM | Admit: 2017-11-16 | Discharge: 2017-11-29 | DRG: 233 | Disposition: A | Discharge status: Rehabilitation Facility | Payer: BLUE CROSS/BLUE SHIELD | Attending: Surgery | Admitting: Surgery

## 2017-11-16 ENCOUNTER — Other Ambulatory Visit: Payer: Self-pay

## 2017-11-16 ENCOUNTER — Encounter (HOSPITAL_COMMUNITY): Payer: Self-pay

## 2017-11-16 ENCOUNTER — Emergency Department (HOSPITAL_COMMUNITY): Payer: BLUE CROSS/BLUE SHIELD

## 2017-11-16 ENCOUNTER — Inpatient Hospital Stay (HOSPITAL_COMMUNITY): Payer: BLUE CROSS/BLUE SHIELD

## 2017-11-16 DIAGNOSIS — I5023 Acute on chronic systolic (congestive) heart failure: Secondary | ICD-10-CM

## 2017-11-16 DIAGNOSIS — G8929 Other chronic pain: Secondary | ICD-10-CM | POA: Diagnosis present

## 2017-11-16 DIAGNOSIS — M62838 Other muscle spasm: Secondary | ICD-10-CM | POA: Diagnosis not present

## 2017-11-16 DIAGNOSIS — I700 Atherosclerosis of aorta: Secondary | ICD-10-CM | POA: Diagnosis present

## 2017-11-16 DIAGNOSIS — Z9981 Dependence on supplemental oxygen: Secondary | ICD-10-CM

## 2017-11-16 DIAGNOSIS — Z720 Tobacco use: Secondary | ICD-10-CM | POA: Diagnosis not present

## 2017-11-16 DIAGNOSIS — E785 Hyperlipidemia, unspecified: Secondary | ICD-10-CM | POA: Diagnosis not present

## 2017-11-16 DIAGNOSIS — R0902 Hypoxemia: Secondary | ICD-10-CM

## 2017-11-16 DIAGNOSIS — I5043 Acute on chronic combined systolic (congestive) and diastolic (congestive) heart failure: Secondary | ICD-10-CM | POA: Diagnosis not present

## 2017-11-16 DIAGNOSIS — E871 Hypo-osmolality and hyponatremia: Secondary | ICD-10-CM | POA: Diagnosis not present

## 2017-11-16 DIAGNOSIS — I69351 Hemiplegia and hemiparesis following cerebral infarction affecting right dominant side: Secondary | ICD-10-CM

## 2017-11-16 DIAGNOSIS — R0682 Tachypnea, not elsewhere classified: Secondary | ICD-10-CM | POA: Diagnosis not present

## 2017-11-16 DIAGNOSIS — R1310 Dysphagia, unspecified: Secondary | ICD-10-CM | POA: Diagnosis present

## 2017-11-16 DIAGNOSIS — J690 Pneumonitis due to inhalation of food and vomit: Secondary | ICD-10-CM | POA: Diagnosis not present

## 2017-11-16 DIAGNOSIS — E876 Hypokalemia: Secondary | ICD-10-CM | POA: Diagnosis present

## 2017-11-16 DIAGNOSIS — Z8249 Family history of ischemic heart disease and other diseases of the circulatory system: Secondary | ICD-10-CM | POA: Diagnosis not present

## 2017-11-16 DIAGNOSIS — Z951 Presence of aortocoronary bypass graft: Secondary | ICD-10-CM

## 2017-11-16 DIAGNOSIS — Z888 Allergy status to other drugs, medicaments and biological substances status: Secondary | ICD-10-CM

## 2017-11-16 DIAGNOSIS — F17200 Nicotine dependence, unspecified, uncomplicated: Secondary | ICD-10-CM | POA: Diagnosis not present

## 2017-11-16 DIAGNOSIS — Z87891 Personal history of nicotine dependence: Secondary | ICD-10-CM

## 2017-11-16 DIAGNOSIS — E782 Mixed hyperlipidemia: Secondary | ICD-10-CM | POA: Diagnosis not present

## 2017-11-16 DIAGNOSIS — Z8673 Personal history of transient ischemic attack (TIA), and cerebral infarction without residual deficits: Secondary | ICD-10-CM

## 2017-11-16 DIAGNOSIS — I5021 Acute systolic (congestive) heart failure: Secondary | ICD-10-CM | POA: Diagnosis not present

## 2017-11-16 DIAGNOSIS — I69391 Dysphagia following cerebral infarction: Secondary | ICD-10-CM

## 2017-11-16 DIAGNOSIS — I6523 Occlusion and stenosis of bilateral carotid arteries: Secondary | ICD-10-CM | POA: Diagnosis present

## 2017-11-16 DIAGNOSIS — J9811 Atelectasis: Secondary | ICD-10-CM | POA: Diagnosis not present

## 2017-11-16 DIAGNOSIS — I10 Essential (primary) hypertension: Secondary | ICD-10-CM

## 2017-11-16 DIAGNOSIS — I214 Non-ST elevation (NSTEMI) myocardial infarction: Secondary | ICD-10-CM | POA: Diagnosis not present

## 2017-11-16 DIAGNOSIS — R918 Other nonspecific abnormal finding of lung field: Secondary | ICD-10-CM | POA: Diagnosis not present

## 2017-11-16 DIAGNOSIS — I255 Ischemic cardiomyopathy: Secondary | ICD-10-CM | POA: Diagnosis not present

## 2017-11-16 DIAGNOSIS — Z79899 Other long term (current) drug therapy: Secondary | ICD-10-CM | POA: Diagnosis not present

## 2017-11-16 DIAGNOSIS — R0602 Shortness of breath: Secondary | ICD-10-CM | POA: Diagnosis not present

## 2017-11-16 DIAGNOSIS — J189 Pneumonia, unspecified organism: Secondary | ICD-10-CM | POA: Diagnosis not present

## 2017-11-16 DIAGNOSIS — D62 Acute posthemorrhagic anemia: Secondary | ICD-10-CM

## 2017-11-16 DIAGNOSIS — Z8349 Family history of other endocrine, nutritional and metabolic diseases: Secondary | ICD-10-CM

## 2017-11-16 DIAGNOSIS — R739 Hyperglycemia, unspecified: Secondary | ICD-10-CM | POA: Diagnosis not present

## 2017-11-16 DIAGNOSIS — J9601 Acute respiratory failure with hypoxia: Secondary | ICD-10-CM | POA: Diagnosis not present

## 2017-11-16 DIAGNOSIS — I63512 Cerebral infarction due to unspecified occlusion or stenosis of left middle cerebral artery: Secondary | ICD-10-CM | POA: Diagnosis not present

## 2017-11-16 DIAGNOSIS — I340 Nonrheumatic mitral (valve) insufficiency: Secondary | ICD-10-CM | POA: Diagnosis present

## 2017-11-16 DIAGNOSIS — J181 Lobar pneumonia, unspecified organism: Secondary | ICD-10-CM | POA: Diagnosis not present

## 2017-11-16 DIAGNOSIS — R000 Tachycardia, unspecified: Secondary | ICD-10-CM

## 2017-11-16 DIAGNOSIS — I25810 Atherosclerosis of coronary artery bypass graft(s) without angina pectoris: Secondary | ICD-10-CM | POA: Diagnosis present

## 2017-11-16 DIAGNOSIS — I25110 Atherosclerotic heart disease of native coronary artery with unstable angina pectoris: Secondary | ICD-10-CM | POA: Diagnosis present

## 2017-11-16 DIAGNOSIS — I5189 Other ill-defined heart diseases: Secondary | ICD-10-CM

## 2017-11-16 DIAGNOSIS — R778 Other specified abnormalities of plasma proteins: Secondary | ICD-10-CM | POA: Diagnosis present

## 2017-11-16 DIAGNOSIS — F411 Generalized anxiety disorder: Secondary | ICD-10-CM

## 2017-11-16 DIAGNOSIS — I252 Old myocardial infarction: Secondary | ICD-10-CM | POA: Diagnosis present

## 2017-11-16 DIAGNOSIS — Z7982 Long term (current) use of aspirin: Secondary | ICD-10-CM | POA: Diagnosis not present

## 2017-11-16 DIAGNOSIS — I952 Hypotension due to drugs: Secondary | ICD-10-CM | POA: Diagnosis not present

## 2017-11-16 DIAGNOSIS — G8918 Other acute postprocedural pain: Secondary | ICD-10-CM | POA: Diagnosis not present

## 2017-11-16 DIAGNOSIS — J811 Chronic pulmonary edema: Secondary | ICD-10-CM | POA: Diagnosis not present

## 2017-11-16 DIAGNOSIS — M170 Bilateral primary osteoarthritis of knee: Secondary | ICD-10-CM | POA: Diagnosis not present

## 2017-11-16 DIAGNOSIS — R748 Abnormal levels of other serum enzymes: Secondary | ICD-10-CM | POA: Diagnosis not present

## 2017-11-16 DIAGNOSIS — R7989 Other specified abnormal findings of blood chemistry: Secondary | ICD-10-CM | POA: Diagnosis present

## 2017-11-16 DIAGNOSIS — I6930 Unspecified sequelae of cerebral infarction: Secondary | ICD-10-CM | POA: Diagnosis not present

## 2017-11-16 DIAGNOSIS — Z91048 Other nonmedicinal substance allergy status: Secondary | ICD-10-CM | POA: Diagnosis not present

## 2017-11-16 DIAGNOSIS — G8191 Hemiplegia, unspecified affecting right dominant side: Secondary | ICD-10-CM

## 2017-11-16 DIAGNOSIS — I2510 Atherosclerotic heart disease of native coronary artery without angina pectoris: Secondary | ICD-10-CM | POA: Diagnosis not present

## 2017-11-16 DIAGNOSIS — K5900 Constipation, unspecified: Secondary | ICD-10-CM | POA: Diagnosis not present

## 2017-11-16 DIAGNOSIS — R5381 Other malaise: Secondary | ICD-10-CM | POA: Diagnosis not present

## 2017-11-16 DIAGNOSIS — Z01810 Encounter for preprocedural cardiovascular examination: Secondary | ICD-10-CM | POA: Diagnosis not present

## 2017-11-16 DIAGNOSIS — I639 Cerebral infarction, unspecified: Secondary | ICD-10-CM | POA: Diagnosis not present

## 2017-11-16 DIAGNOSIS — I519 Heart disease, unspecified: Secondary | ICD-10-CM | POA: Diagnosis not present

## 2017-11-16 DIAGNOSIS — M21371 Foot drop, right foot: Secondary | ICD-10-CM | POA: Diagnosis not present

## 2017-11-16 DIAGNOSIS — D72829 Elevated white blood cell count, unspecified: Secondary | ICD-10-CM | POA: Diagnosis not present

## 2017-11-16 DIAGNOSIS — R0989 Other specified symptoms and signs involving the circulatory and respiratory systems: Secondary | ICD-10-CM | POA: Diagnosis not present

## 2017-11-16 DIAGNOSIS — Z48812 Encounter for surgical aftercare following surgery on the circulatory system: Secondary | ICD-10-CM | POA: Diagnosis not present

## 2017-11-16 HISTORY — DX: Unspecified osteoarthritis, unspecified site: M19.90

## 2017-11-16 HISTORY — DX: Pneumonia, unspecified organism: J18.9

## 2017-11-16 LAB — ECHOCARDIOGRAM COMPLETE
AVLVOTPG: 4 mmHg
EERAT: 23.08
EWDT: 141 ms
FS: 21 % — AB (ref 28–44)
Height: 62 in
IV/PV OW: 1.03
LA ID, A-P, ES: 39 mm
LA vol A4C: 46.6 ml
LA vol index: 35.7 mL/m2
LA vol: 56.4 mL
LADIAMINDEX: 2.47 cm/m2
LEFT ATRIUM END SYS DIAM: 39 mm
LV E/e' medial: 23.08
LV PW d: 11.4 mm — AB (ref 0.6–1.1)
LV SIMPSON'S DISK: 34
LV TDI E'LATERAL: 5.33
LV TDI E'MEDIAL: 5.77
LV dias vol: 147 mL — AB (ref 46–106)
LV e' LATERAL: 5.33 cm/s
LV sys vol index: 61 mL/m2
LVDIAVOLIN: 93 mL/m2
LVEEAVG: 23.08
LVOT SV: 50 mL
LVOT VTI: 15.9 cm
LVOT area: 3.14 cm2
LVOT diameter: 20 mm
LVOT peak vel: 104 cm/s
LVSYSVOL: 97 mL — AB (ref 14–42)
MV Dec: 141
MV Peak grad: 6 mmHg
MVPKAVEL: 81 m/s
MVPKEVEL: 123 m/s
PISA EROA: 0.07 cm2
RV LATERAL S' VELOCITY: 9.57 cm/s
Stroke v: 50 ml
TAPSE: 18.9 mm
VTI: 145 cm
Weight: 2000 oz

## 2017-11-16 LAB — BASIC METABOLIC PANEL
Anion gap: 10 (ref 5–15)
BUN: 22 mg/dL — AB (ref 6–20)
CO2: 23 mmol/L (ref 22–32)
CREATININE: 0.65 mg/dL (ref 0.44–1.00)
Calcium: 8.9 mg/dL (ref 8.9–10.3)
Chloride: 106 mmol/L (ref 101–111)
GFR calc Af Amer: >60 mL/min (ref >60)
GFR calc non Af Amer: >60 mL/min (ref >60)
GLUCOSE: 142 mg/dL — AB (ref 65–99)
POTASSIUM: 3.2 mmol/L — AB (ref 3.5–5.1)
Sodium: 139 mmol/L (ref 135–145)

## 2017-11-16 LAB — BLOOD GAS, ARTERIAL
ACID-BASE DEFICIT: 1.2 mmol/L (ref 0.0–2.0)
Bicarbonate: 23.1 mmol/L (ref 20.0–28.0)
DELIVERY SYSTEMS: POSITIVE
Drawn by: 234301
Expiratory PAP: 5
FIO2: 0.6
Inspiratory PAP: 15
O2 SAT: 98 %
PATIENT TEMPERATURE: 37
PCO2 ART: 45.3 mmHg (ref 32.0–48.0)
PH ART: 7.34 — AB (ref 7.350–7.450)
PO2 ART: 126 mmHg — AB (ref 83.0–108.0)

## 2017-11-16 LAB — CBC WITH DIFFERENTIAL/PLATELET
Basophils Absolute: 0 10*3/uL (ref 0.0–0.1)
Basophils Relative: 0 %
EOS ABS: 0.2 10*3/uL (ref 0.0–0.7)
Eosinophils Relative: 2 %
HCT: 40.8 % (ref 36.0–46.0)
Hemoglobin: 12.8 g/dL (ref 12.0–15.0)
LYMPHS ABS: 1.7 10*3/uL (ref 0.7–4.0)
LYMPHS PCT: 16 %
MCH: 29.6 pg (ref 26.0–34.0)
MCHC: 31.4 g/dL (ref 30.0–36.0)
MCV: 94.2 fL (ref 78.0–100.0)
MONOS PCT: 5 %
Monocytes Absolute: 0.6 10*3/uL (ref 0.1–1.0)
NEUTROS PCT: 77 %
Neutro Abs: 8.2 10*3/uL — ABNORMAL HIGH (ref 1.7–7.7)
Platelets: 367 10*3/uL (ref 150–400)
RBC: 4.33 MIL/uL (ref 3.87–5.11)
RDW: 13.9 % (ref 11.5–15.5)
WBC: 10.7 10*3/uL — AB (ref 4.0–10.5)

## 2017-11-16 LAB — BRAIN NATRIURETIC PEPTIDE: B Natriuretic Peptide: 675 pg/mL — ABNORMAL HIGH (ref 0.0–100.0)

## 2017-11-16 LAB — TROPONIN I
Troponin I: 0.41 ng/mL (ref <0.03)
Troponin I: 3.02 ng/mL (ref <0.03)
Troponin I: 10.5 ng/mL (ref <0.03)
Troponin I: 6.71 ng/mL (ref <0.03)

## 2017-11-16 LAB — D-DIMER, QUANTITATIVE: D-Dimer, Quant: 1.08 ug/mL-FEU — ABNORMAL HIGH (ref 0.00–0.50)

## 2017-11-16 LAB — PROTIME-INR
INR: 0.87
Prothrombin Time: 11.8 seconds (ref 11.4–15.2)

## 2017-11-16 LAB — I-STAT CG4 LACTIC ACID, ED: Lactic Acid, Venous: 1.1 mmol/L (ref 0.5–1.9)

## 2017-11-16 LAB — APTT: aPTT: 40 seconds — ABNORMAL HIGH (ref 24–36)

## 2017-11-16 LAB — MAGNESIUM: Magnesium: 1.9 mg/dL (ref 1.7–2.4)

## 2017-11-16 MED ORDER — GUAIFENESIN ER 600 MG PO TB12
1200.0000 mg | ORAL_TABLET | Freq: Two times a day (BID) | ORAL | Status: DC
  Administered 2017-11-16 – 2017-11-29 (×23): 1200 mg via ORAL
  Filled 2017-11-16 (×26): qty 2

## 2017-11-16 MED ORDER — DILTIAZEM HCL 60 MG PO TABS
30.0000 mg | ORAL_TABLET | Freq: Three times a day (TID) | ORAL | Status: DC
  Administered 2017-11-16 – 2017-11-17 (×2): 30 mg via ORAL
  Filled 2017-11-16 (×2): qty 1

## 2017-11-16 MED ORDER — HEPARIN BOLUS VIA INFUSION: 3000.0000 [IU] | Freq: Once | INTRAVENOUS | Status: DC

## 2017-11-16 MED ORDER — ESCITALOPRAM OXALATE 10 MG PO TABS
10.0000 mg | ORAL_TABLET | Freq: Every day | ORAL | Status: DC
  Administered 2017-11-16 – 2017-11-29 (×12): 10 mg via ORAL
  Filled 2017-11-16 (×13): qty 1

## 2017-11-16 MED ORDER — HEPARIN (PORCINE) IN NACL 100-0.45 UNIT/ML-% IJ SOLN
1250.0000 [IU]/h | INTRAMUSCULAR | Status: DC
  Administered 2017-11-16 – 2017-11-17 (×2): 700 [IU]/h via INTRAVENOUS
  Administered 2017-11-19: 1100 [IU]/h via INTRAVENOUS
  Administered 2017-11-20: 1250 [IU]/h via INTRAVENOUS
  Filled 2017-11-16 (×4): qty 250

## 2017-11-16 MED ORDER — ASPIRIN 81 MG PO CHEW
324.0000 mg | CHEWABLE_TABLET | Freq: Once | ORAL | Status: AC
Start: 2017-11-16 — End: 2017-11-16
  Administered 2017-11-16: 324 mg via ORAL
  Filled 2017-11-16: qty 4

## 2017-11-16 MED ORDER — ROSUVASTATIN CALCIUM 10 MG PO TABS
20.0000 mg | ORAL_TABLET | Freq: Every day | ORAL | Status: DC
  Administered 2017-11-16 – 2017-11-29 (×12): 20 mg via ORAL
  Filled 2017-11-16: qty 1
  Filled 2017-11-16: qty 2
  Filled 2017-11-16 (×9): qty 1
  Filled 2017-11-16: qty 2
  Filled 2017-11-16: qty 1

## 2017-11-16 MED ORDER — HEPARIN (PORCINE) IN NACL 100-0.45 UNIT/ML-% IJ SOLN
680.0000 [IU]/h | INTRAMUSCULAR | Status: DC
Start: 2017-11-16 — End: 2017-11-16

## 2017-11-16 MED ORDER — IOPAMIDOL (ISOVUE-370) INJECTION 76%
100.0000 mL | Freq: Once | INTRAVENOUS | Status: AC | PRN
  Administered 2017-11-16: 100 mL via INTRAVENOUS

## 2017-11-16 MED ORDER — SODIUM CHLORIDE 0.9 % IV BOLUS (SEPSIS)
1000.0000 mL | Freq: Once | INTRAVENOUS | Status: AC
  Administered 2017-11-16: 1000 mL via INTRAVENOUS

## 2017-11-16 MED ORDER — HEPARIN BOLUS VIA INFUSION
2000.0000 [IU] | Freq: Once | INTRAVENOUS | Status: AC
  Administered 2017-11-16: 2000 [IU] via INTRAVENOUS

## 2017-11-16 MED ORDER — SODIUM CHLORIDE 0.9% FLUSH
3.0000 mL | Freq: Two times a day (BID) | INTRAVENOUS | Status: DC
  Administered 2017-11-16: 3 mL via INTRAVENOUS

## 2017-11-16 MED ORDER — IPRATROPIUM-ALBUTEROL 0.5-2.5 (3) MG/3ML IN SOLN
3.0000 mL | Freq: Once | RESPIRATORY_TRACT | Status: AC
  Administered 2017-11-16: 3 mL via RESPIRATORY_TRACT
  Filled 2017-11-16: qty 3

## 2017-11-16 MED ORDER — HYDROXYZINE HCL 25 MG PO TABS
25.0000 mg | ORAL_TABLET | Freq: Every day | ORAL | Status: DC
  Administered 2017-11-16 – 2017-11-28 (×11): 25 mg via ORAL
  Filled 2017-11-16 (×11): qty 1

## 2017-11-16 MED ORDER — SODIUM CHLORIDE 0.9% FLUSH: 3.0000 mL | INTRAVENOUS | Status: DC | PRN

## 2017-11-16 MED ORDER — ALBUTEROL SULFATE (2.5 MG/3ML) 0.083% IN NEBU: 2.5000 mg | INHALATION_SOLUTION | RESPIRATORY_TRACT | Status: DC | PRN

## 2017-11-16 MED ORDER — SODIUM CHLORIDE 0.9 % IV SOLN: 250.0000 mL | INTRAVENOUS | Status: DC | PRN

## 2017-11-16 MED ORDER — ENOXAPARIN SODIUM 40 MG/0.4ML ~~LOC~~ SOLN
40.0000 mg | SUBCUTANEOUS | Status: DC
  Administered 2017-11-16: 40 mg via SUBCUTANEOUS
  Filled 2017-11-16: qty 0.4

## 2017-11-16 MED ORDER — FUROSEMIDE 10 MG/ML IJ SOLN
60.0000 mg | Freq: Once | INTRAMUSCULAR | Status: AC
  Administered 2017-11-16: 60 mg via INTRAVENOUS
  Filled 2017-11-16: qty 6

## 2017-11-16 MED ORDER — SODIUM CHLORIDE 0.9 % IV SOLN
1.0000 g | Freq: Once | INTRAVENOUS | Status: AC
  Administered 2017-11-16: 1 g via INTRAVENOUS
  Filled 2017-11-16: qty 10

## 2017-11-16 MED ORDER — BACLOFEN 10 MG PO TABS
5.0000 mg | ORAL_TABLET | Freq: Three times a day (TID) | ORAL | Status: DC
  Administered 2017-11-16 – 2017-11-22 (×16): 5 mg via ORAL
  Filled 2017-11-16 (×16): qty 1

## 2017-11-16 MED ORDER — PNEUMOCOCCAL VAC POLYVALENT 25 MCG/0.5ML IJ INJ: 0.5000 mL | INJECTION | INTRAMUSCULAR | Status: DC | PRN

## 2017-11-16 MED ORDER — ASPIRIN EC 325 MG PO TBEC
325.0000 mg | DELAYED_RELEASE_TABLET | Freq: Every day | ORAL | Status: DC
  Administered 2017-11-16: 325 mg via ORAL
  Filled 2017-11-16: qty 1

## 2017-11-16 MED ORDER — FAMOTIDINE 20 MG PO TABS
20.0000 mg | ORAL_TABLET | Freq: Two times a day (BID) | ORAL | Status: DC
  Administered 2017-11-16 – 2017-11-22 (×12): 20 mg via ORAL
  Filled 2017-11-16 (×12): qty 1

## 2017-11-16 MED ORDER — IPRATROPIUM-ALBUTEROL 0.5-2.5 (3) MG/3ML IN SOLN
3.0000 mL | Freq: Four times a day (QID) | RESPIRATORY_TRACT | Status: DC
Start: 2017-11-16 — End: 2017-11-18
  Administered 2017-11-16 – 2017-11-17 (×6): 3 mL via RESPIRATORY_TRACT
  Filled 2017-11-16 (×6): qty 3

## 2017-11-16 MED ORDER — SODIUM CHLORIDE 0.9 % IV SOLN
500.0000 mg | Freq: Once | INTRAVENOUS | Status: AC
  Administered 2017-11-16: 500 mg via INTRAVENOUS
  Filled 2017-11-16: qty 500

## 2017-11-16 MED ORDER — SODIUM CHLORIDE 0.9 % IV SOLN
3.0000 g | Freq: Four times a day (QID) | INTRAVENOUS | Status: DC
  Administered 2017-11-16 – 2017-11-20 (×16): 3 g via INTRAVENOUS
  Filled 2017-11-16 (×22): qty 3

## 2017-11-16 MED ORDER — POTASSIUM CHLORIDE 10 MEQ/100ML IV SOLN
10.0000 meq | INTRAVENOUS | Status: AC
  Administered 2017-11-16 (×4): 10 meq via INTRAVENOUS
  Filled 2017-11-16 (×5): qty 100

## 2017-11-16 NOTE — ED Notes (Signed)
[facility] left with pt at this time.

## 2017-11-16 NOTE — Progress Notes (Signed)
ANTICOAGULATION CONSULT NOTE - Preliminary  Pharmacy Consult for Heparin Indication: ACS/STEMI  Allergies  Allergen Reactions  . Lopressor [Metoprolol Tartrate]     Angioedema  . Adhesive [Tape]   . Lipitor [Atorvastatin] Diarrhea    Patient Measurements: Height: 5\' 2"  (157.5 cm) Weight: 125 lb (56.7 kg) IBW/kg (Calculated) : 50.1 HEPARIN DW (KG): 56.7   Vital Signs: Temp: 99.1 F (37.3 C) (02/21 0458) Temp Source: Oral (02/21 0458) BP: 138/87 (02/21 0631) Pulse Rate: 108 (02/21 0631)  Labs: Recent Labs    11/16/17 0528  HGB 12.8  HCT 40.8  PLT 367  CREATININE 0.65  TROPONINI 0.41*   Estimated Creatinine Clearance: 61.4 mL/min (by C-G formula based on SCr of 0.65 mg/dL).  Medical History: Past Medical History:  Diagnosis Date  . Hay fever   . Stroke ([facility]) 08/23/2017   right sided deficits.     Medications:   Assessment: 57 yo female presented to the ED with SOB. She has no prior respiratory history. Troponin is elevated. Pharmacy has been consulted for IV heparin dosing.  Goal of Therapy:  Heparin level goal 0.3-0.7 unit/ml Monitor platelets by anticoagulation protocol: Yes   Plan:  Heparin  Bolus 3000 units IV Heparin infusion at 680 unit/hr Heparin level in 6-8 hours  Preliminary review of pertinent patient information completed.  Forestine Na clinical pharmacist will complete review during morning rounds to assess the patient and finalize treatment regimen.  Norberto Sorenson, [facility] 11/16/2017,7:09 AM

## 2017-11-16 NOTE — Progress Notes (Signed)
Pt triad to make them aware pt has arrived. Carroll Kinds RN

## 2017-11-16 NOTE — Consult Note (Signed)
Cardiology Consultation:   Patient ID: [name]; [id number]; 1961/01/23   Admit date: 11/16/2017 Date of Consult: 11/16/2017  Primary Care Provider: [name], DO Primary Cardiologist: New Primary Electrophysiologist:  Dr. Allegra Lai   Patient Profile:   [name] is a 57 y.o. 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 Roderic Palau.  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  She continues to follow with neurology and rehabilitation services, has made some progress, able to ambulate with a cane.  She has had improving right leg weakness but still significant right arm weakness.  Speech also improving.  She presents to the ER reporting at least a 2-3-day history of increasing shortness of breath, intermittent cough, no fevers or chills, and no chest pain.  She has an implantable loop recorder in place with no recently documented atrial fibrillation, and is not aware of any palpitations.  Patient presents with hypoxia, chest x-ray suggesting right middle lobe infiltrate, however follow-up chest CT showing dependent infiltrates in the lower lobes suggestive of possible aspiration as well as small pleural effusions.  Also noted to be in sinus tachycardia with ECG showing lateral ischemic changes that are new in comparison to her prior tracing with troponin I increasing from 0.41 up to 3.02.  Past Medical History:  Diagnosis Date  . Hay fever   . History of stroke 08/23/2017   Left MCA infarct status post TPA and thrombectomy    Past Surgical History:  Procedure Laterality Date  . IR ANGIO INTRA EXTRACRAN SEL COM CAROTID INNOMINATE UNI R MOD SED  08/21/2017  . IR ANGIO VERTEBRAL SEL SUBCLAVIAN INNOMINATE UNI R MOD SED   08/21/2017  . IR PERCUTANEOUS ART THROMBECTOMY/INFUSION INTRACRANIAL INC DIAG ANGIO  08/21/2017  . IR RADIOLOGIST EVAL & MGMT  10/30/2017  . LOOP RECORDER INSERTION N/A 08/28/2017   Procedure: LOOP RECORDER INSERTION;  Surgeon: Constance Haw, MD;  Location: [facility];  Service: Cardiovascular;  Laterality: N/A;  . RADIOLOGY WITH ANESTHESIA N/A 08/21/2017   Procedure: RADIOLOGY WITH ANESTHESIA;  Surgeon: Luanne Bras, MD;  Location: [facility];  Service: Radiology;  Laterality: N/A;  . TEE WITHOUT CARDIOVERSION N/A 08/28/2017   Procedure: TRANSESOPHAGEAL ECHOCARDIOGRAM (TEE);  Surgeon: Fay Records, MD;  Location: [facility];  Service: Cardiovascular;  Laterality: N/A;     Inpatient Medications: Scheduled Meds: . aspirin  325 mg Oral Daily  . baclofen  5 mg Oral TID  . diltiazem  30 mg Oral Q8H  . escitalopram  10 mg Oral Daily  . famotidine  20 mg Oral BID  . guaiFENesin  1,200 mg Oral BID  . heparin  2,000 Units Intravenous Once  . hydrOXYzine  25 mg Oral QHS  . ipratropium-albuterol  3 mL Nebulization Q6H  . rosuvastatin  20 mg Oral Daily  . sodium chloride flush  3 mL Intravenous Q12H   Continuous Infusions: . sodium chloride    . ampicillin-sulbactam (UNASYN) IV 3 g (11/16/17 1036)  . heparin    . potassium chloride 10 mEq (11/16/17 1036)   PRN Meds: sodium chloride, albuterol, sodium chloride flush  Allergies:    Allergies  Allergen Reactions  . Lopressor [Metoprolol Tartrate]     Angioedema  .  Lipitor [Atorvastatin] Diarrhea    Social History:   Social History   Socioeconomic History  . Marital status: Married    Spouse name: Not on file  . Number of children: 2  . Years of education: Not on file  . Highest education level: Not on file  Social Needs  . Financial resource strain: Not on file  . Food insecurity - worry: Not on file  . Food insecurity - inability: Not on file  . Transportation needs - medical: Not on file  . Transportation  needs - non-medical: Not on file  Occupational History  . Not on file  Tobacco Use  . Smoking status: Former Smoker    Types: Cigarettes  . Smokeless tobacco: Never Used  Substance and Sexual Activity  . Alcohol use: No    Frequency: Never  . Drug use: No  . Sexual activity: Yes    Partners: Male  Other Topics Concern  . Not on file  Social History Narrative   Married. 2 children.    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    Walks with cane or wheelchair.    Former smoker.    Drinks caffeine.    Smoke alarm in the home.   Firearms in the home.    Wears her seatbelt.    Feels safe In her relationships.     Family History:   The patient's family history includes Diabetes type II in her mother; Heart attack in her father; Hyperlipidemia in her mother; Hypertension in her brother and mother.  ROS:  Please see the history of present illness.  Generally improving right leg weakness, improving dysarthria.  Using a cane to ambulate.  All other ROS reviewed and negative.     Physical Exam/Data:   Vitals:   11/16/17 0930 11/16/17 0945 11/16/17 1000 11/16/17 1007  BP: 119/77  119/77 119/77  Pulse:  (!) 109 100 97  Resp:    18  Temp:      TempSrc:      SpO2:  100% 100% 100%  Weight:      Height:        Intake/Output Summary (Last 24 hours) at 11/16/2017 1129 Last data filed at 11/16/2017 0730 Gross per 24 hour  Intake 1350 ml  Output -  Net 1350 ml   Filed Weights   11/16/17 0449  Weight: 125 lb (56.7 kg)   Body mass index is 22.86 kg/m.   Gen: Patient on BiPAP. HEENT: Conjunctiva and lids normal. Neck: Supple, elevated JVP, no carotid bruits, no thyromegaly. Lungs: Mainly lower lung zone crackles, no wheezing. Cardiac: RRR, no gallop, no pericardial rub. Abdomen: Soft, nontender, bowel sounds present, no guarding or rebound. Extremities: No pitting edema, distal pulses 2+. Skin: Warm and dry. Musculoskeletal: No kyphosis. Neuropsychiatric: Alert and oriented x3,  affect grossly appropriate.  Right leg weakness, right arm paresis, dysarthria.  EKG:  I personally reviewed the tracing from 11/16/2017 which shows sinus tachycardia with left atrial enlargement and diffuse ST segment abnormalities, most prominent in the lateral leads and consistent with ischemia.  Telemetry:  I personally reviewed telemetry which shows sinus rhythm.  Relevant CV Studies:  Echocardiogram 08/22/2017: Study Conclusions  - Left ventricle: The cavity size was normal. Wall thickness was   increased in a pattern of mild LVH. Systolic function was normal.   The estimated ejection fraction was in the range of 55% to 60%.   Wall motion was normal; there were no regional wall motion   abnormalities. The study  is not technically sufficient to allow   evaluation of LV diastolic function. - Mitral valve: Mildly thickened leaflets . There was mild   regurgitation. - Left atrium: Moderately dilated. - Right atrium: Moderately dilated. - Inferior vena cava: The vessel was dilated. The respirophasic   diameter changes were blunted (< 50%), consistent with elevated   central venous pressure. On vent.  Impressions:  - LVEF 55-60%, mild LVH, normal wall motion, mild MR, moderate   biatrial enlargment, dilated IVC on vent.  TEE 08/28/2017: Left ventricle:  LVEF appears depressed.  ------------------------------------------------------------------- Aortic valve:  AV is mildly thickened. Triv AI.  ------------------------------------------------------------------- Aorta:  Moderate to large amount of fixed atheroscleroitc plquing in thoracic aorta.  ------------------------------------------------------------------- Left atrium:   No evidence of thrombus in the atrial cavity or appendage.  ------------------------------------------------------------------- Atrial septum:  No PFO by color doppler or with injection of agitated  saline.  ------------------------------------------------------------------- Right ventricle:  RVEF is normal  ------------------------------------------------------------------- Pulmonic valve:   PV is normal.  ------------------------------------------------------------------- Tricuspid valve:  TV is normal Trivial TR  Laboratory Data:  Chemistry Recent Labs  Lab 11/16/17 0528  NA 139  K 3.2*  CL 106  CO2 23  GLUCOSE 142*  BUN 22*  CREATININE 0.65  CALCIUM 8.9  GFRNONAA >60  GFRAA >60  ANIONGAP 10    No results for input(s): PROT, ALBUMIN, AST, ALT, ALKPHOS, BILITOT in the last 168 hours. Hematology Recent Labs  Lab 11/16/17 0528  WBC 10.7*  RBC 4.33  HGB 12.8  HCT 40.8  MCV 94.2  MCH 29.6  MCHC 31.4  RDW 13.9  PLT 367   Cardiac Enzymes Recent Labs  Lab 11/16/17 0528 11/16/17 0911  TROPONINI 0.41* 3.02*   No results for input(s): TROPIPOC in the last 168 hours.  BNP Recent Labs  Lab 11/16/17 0528  BNP 675.0*    DDimer  Recent Labs  Lab 11/16/17 0528  DDIMER 1.08*    Radiology/Studies:  Ct Angio Chest Pe W And/or Wo Contrast  Result Date: 11/16/2017 CLINICAL DATA:  Suspect pulmonary embolism. Positive D-dimer. Short of breath. EXAM: CT ANGIOGRAPHY CHEST WITH CONTRAST TECHNIQUE: Multidetector CT imaging of the chest was performed using the standard protocol during bolus administration of intravenous contrast. Multiplanar CT image reconstructions and MIPs were obtained to evaluate the vascular anatomy. CONTRAST:  141mL ISOVUE-370 IOPAMIDOL (ISOVUE-370) INJECTION 76% COMPARISON:  Chest x-ray 11/16/2017 FINDINGS: Cardiovascular: Negative for pulmonary embolism. Pulmonary arteries normal in caliber Atherosclerotic thoracic aorta without aneurysm. Aorta not significantly opacified. Atherosclerotic calcification in the coronary arteries. Left ventricular dilatation. No pericardial effusion. Mediastinum/Nodes: Negative for mass or adenopathy  Lungs/Pleura: Dependent infiltrates in the lower lobes bilaterally suggestive of pneumonia. Small pleural effusions bilaterally. Upper Abdomen: Negative Musculoskeletal: Negative Review of the MIP images confirms the above findings. IMPRESSION: Negative for pulmonary embolism Dependent infiltrates in the lower lobes bilaterally, probable aspiration pneumonia. Small pleural effusions bilaterally. Left ventricular dilatation.  Coronary artery calcification. Aortic Atherosclerosis (ICD10-I70.0). Electronically Signed   By: Franchot Gallo M.D.   On: 11/16/2017 07:33   Dg Chest Portable 1 View  Result Date: 11/16/2017 CLINICAL DATA:  Acute onset shortness of breath. EXAM: PORTABLE CHEST 1 VIEW COMPARISON:  Chest radiograph August 24, 2017 FINDINGS: Cardiac silhouette is mildly enlarged and unchanged. Diffusely coarsened pulmonary interstitium with new patchy RIGHT middle lobe opacification. Increased lung volumes. No pleural effusion. Loop monitor projects over the heart. No pneumothorax. Soft tissue planes and included osseous structures are non suspicious. IMPRESSION: RIGHT middle lobe consolidation concerning  for pneumonia. Followup PA and lateral chest X-ray is recommended in 3-4 weeks following trial of antibiotic therapy to ensure resolution and exclude underlying malignancy. Probable COPD. Stable cardiomegaly. Aortic Atherosclerosis (ICD10-I70.0). Electronically Signed   By: Elon Alas M.D.   On: 11/16/2017 05:23    Assessment and Plan:   1. NSTEMI in the setting of hypoxic respiratory failure, question aspiration pneumonia by chest CT but no pulmonary embolus.  ECG does show ischemic changes.  No active chest pain.  2.  Left MCA distribution stroke treated with TPA and thrombectomy in November 2018.  She still has right-sided deficits although has been improving with therapy.  3.  Tobacco abuse in remission.  4.  Mixed hyperlipidemia, LDL 143 in November 2018.  Currently on Crestor.  5.   Loop recorder in place following stroke, no documented atrial arrhythmias as yet.  Reviewed records, discussed with patient and family member as well as Dr. Roderic Palau.  Patient is being admitted on antibiotics and with supportive respiratory measures which now include BiPAP.  Would treat with aspirin, continue statin, and continue heparin infusion.  She has a reported severe allergy to Lopressor and therefore would start short acting Cardizem for general heart rate control in the setting of ischemia.  Echocardiogram to be obtained to follow-up on LVEF.  She will likely need a cardiac catheterization for further evaluation, plans being made to get her a bed at [facility]   Signed, [name], MD  11/16/2017 11:29 AM

## 2017-11-16 NOTE — ED Notes (Signed)
CRITICAL VALUE ALERT  Critical Value:  Trop 3.02   Date & Time Notied:  11/16/17, 1026  Provider Notified: Dr. Roderic Palau  Orders Received/Actions taken: No new orders

## 2017-11-16 NOTE — ED Provider Notes (Signed)
[facility] EMERGENCY DEPARTMENT Provider Note   CSN: [id number] Arrival date & time: 11/16/17  0441     History   Chief Complaint Chief Complaint  Patient presents with  . Shortness of Breath    HPI [name] is a 57 y.o. female.  Level 5 caveat for respiratory distress.  Patient with history of previous stroke with right-sided weakness presenting with acute onset of shortness of breath that woke her from sleep.  States she just could not breathe.  She has chronic pain related to her stroke that she says normally wakes her up.  She denies any chest pain.  She denies any significant cough or fever.  No history of asthma or COPD though she is a former smoker.  Never had any breathing issues in the past.  No history of CHF.  Her husband states she is due to have an ultrasound of her right leg today due to a chronic "cold feeling" that she has.  EMS reports she was wheezing and received multiple nebulizers prior to arrival.   The history is provided by the patient and the EMS personnel. The history is limited by the condition of the patient.  Shortness of Breath  Associated symptoms include cough and leg swelling. Pertinent negatives include no fever, no headaches, no chest pain, no vomiting, no abdominal pain and no rash.    Past Medical History:  Diagnosis Date  . Hay fever   . Stroke ([place]) 08/23/2017   right sided deficits.     Patient Active Problem List   Diagnosis Date Noted  . Constipation 11/10/2017  . Stress and adjustment reaction 10/13/2017  . Urticaria 10/13/2017  . Former smoker 10/13/2017  . Cerebrovascular accident (CVA) due to occlusion of left middle cerebral artery ([place])   . Right hemiplegia ([place])   . Dysphagia, post-stroke   . Hyperlipidemia     Past Surgical History:  Procedure Laterality Date  . IR ANGIO INTRA EXTRACRAN SEL COM CAROTID INNOMINATE UNI R MOD SED  08/21/2017  . IR ANGIO VERTEBRAL SEL SUBCLAVIAN INNOMINATE UNI R MOD SED   08/21/2017  . IR PERCUTANEOUS ART THROMBECTOMY/INFUSION INTRACRANIAL INC DIAG ANGIO  08/21/2017  . IR RADIOLOGIST EVAL & MGMT  10/30/2017  . LOOP RECORDER INSERTION N/A 08/28/2017   Procedure: LOOP RECORDER INSERTION;  Surgeon: Constance Haw, MD;  Location: [place] CV [facility];  Service: Cardiovascular;  Laterality: N/A;  . RADIOLOGY WITH ANESTHESIA N/A 08/21/2017   Procedure: RADIOLOGY WITH ANESTHESIA;  Surgeon: Luanne Bras, MD;  Location: [place];  Service: Radiology;  Laterality: N/A;  . TEE WITHOUT CARDIOVERSION N/A 08/28/2017   Procedure: TRANSESOPHAGEAL ECHOCARDIOGRAM (TEE);  Surgeon: Fay Records, MD;  Location: [facility] ENDOSCOPY;  Service: Cardiovascular;  Laterality: N/A;    OB History    Gravida Para Term Preterm AB Living   2 2       2    SAB TAB Ectopic Multiple Live Births                   Home Medications    Prior to Admission medications   Medication Sig Start Date End Date Taking? Authorizing Provider  aspirin 325 MG EC tablet Take 1 tablet (325 mg total) by mouth daily. 10/13/17   [name], [name], DO  baclofen (LIORESAL) 10 MG tablet Take 0.5 tablets (5 mg total) by mouth 3 (three) times daily. 11/02/17 12/02/17  Jamse Arn, MD  escitalopram (LEXAPRO) 10 MG tablet Take 1 tablet (10 mg  total) by mouth daily. 11/10/17   [name], [name], DO  famotidine (PEPCID) 20 MG tablet Take 1 tablet (20 mg total) by mouth 2 (two) times daily. 10/13/17   [name], [name], DO  hydrOXYzine (ATARAX/VISTARIL) 25 MG tablet Take 1 tablet (25 mg total) by mouth at bedtime. 11/10/17   [name], [name], DO  rosuvastatin (CRESTOR) 20 MG tablet Take 1 tablet (20 mg total) by mouth daily. 10/13/17   [name], DO    Family History Family History  Problem Relation Age of Onset  . Heart attack Father   . Hypertension Mother   . Hyperlipidemia Mother   . Diabetes type II Mother   . Hypertension Brother     Social History Social History   Tobacco Use  . Smoking status: Former  Smoker    Types: Cigarettes  . Smokeless tobacco: Never Used  Substance Use Topics  . Alcohol use: No    Frequency: Never  . Drug use: No     Allergies   Lopressor [metoprolol tartrate]; Adhesive [tape]; and Lipitor [atorvastatin]   Review of Systems Review of Systems  Constitutional: Positive for appetite change. Negative for activity change and fever.  HENT: Negative for congestion.   Respiratory: Positive for cough and shortness of breath. Negative for chest tightness.   Cardiovascular: Positive for leg swelling. Negative for chest pain.  Gastrointestinal: Negative for abdominal pain, nausea and vomiting.  Genitourinary: Negative for dysuria, hematuria, vaginal bleeding and vaginal discharge.  Musculoskeletal: Negative for arthralgias and myalgias.  Skin: Negative for rash.  Neurological: Negative for dizziness, weakness and headaches.    all other systems are negative except as noted in the HPI and PMH.    Physical Exam Updated Vital Signs BP (!) 148/85   Pulse (!) 114   Temp 99.1 F (37.3 C) (Oral)   Resp (!) 24   Ht 5\' 2"  (1.575 m)   Wt 56.7 kg (125 lb)   LMP  (LMP Unknown)   SpO2 91%   BMI 22.86 kg/m   Physical Exam  Constitutional: She is oriented to person, place, and time. She appears well-developed and well-nourished. She appears distressed.  Mildly increased work of breathing  HENT:  Head: Normocephalic and atraumatic.  Mouth/Throat: Oropharynx is clear and moist. No oropharyngeal exudate.  Eyes: Conjunctivae and EOM are normal. Pupils are equal, round, and reactive to light.  Neck: Normal range of motion. Neck supple.  No meningismus.  Cardiovascular: Normal rate, regular rhythm, normal heart sounds and intact distal pulses.  No murmur heard. Pulmonary/Chest: Breath sounds normal. She is in respiratory distress. She has no wheezes.  Lungs sound relatively clear without wheezing.  Patient with increased work of breathing and is requiring 6 L of  oxygen to maintain her saturation greater than 90%  Abdominal: Soft. There is no tenderness. There is no rebound and no guarding.  Musculoskeletal: Normal range of motion. She exhibits no edema or tenderness.  Neurological: She is alert and oriented to person, place, and time. No cranial nerve deficit. She exhibits normal muscle tone. Coordination normal.  R sided weakness at baseline.  Skin: Skin is warm.  Psychiatric: She has a normal mood and affect. Her behavior is normal.  Nursing note and vitals reviewed.    ED Treatments / Results  Labs (all labs ordered are listed, but only abnormal results are displayed) Labs Reviewed  CBC WITH DIFFERENTIAL/PLATELET - Abnormal; Notable for the following components:      Result Value  WBC 10.7 (*)    Neutro Abs 8.2 (*)    All other components within normal limits  BASIC METABOLIC PANEL - Abnormal; Notable for the following components:   Potassium 3.2 (*)    Glucose, Bld 142 (*)    BUN 22 (*)    All other components within normal limits  TROPONIN I - Abnormal; Notable for the following components:   Troponin I 0.41 (*)    All other components within normal limits  BRAIN NATRIURETIC PEPTIDE - Abnormal; Notable for the following components:   B Natriuretic Peptide 675.0 (*)    All other components within normal limits  D-DIMER, QUANTITATIVE (NOT AT [facility]) - Abnormal; Notable for the following components:   D-Dimer, Quant 1.08 (*)    All other components within normal limits  APTT - Abnormal; Notable for the following components:   aPTT 40 (*)    All other components within normal limits  CULTURE, BLOOD (ROUTINE X 2)  CULTURE, BLOOD (ROUTINE X 2)  PROTIME-INR  HEPARIN LEVEL (UNFRACTIONATED)  BLOOD GAS, ARTERIAL  TROPONIN I  I-STAT CG4 LACTIC ACID, ED  I-STAT CG4 LACTIC ACID, ED    EKG  EKG Interpretation  Date/Time:  Thursday November 16 2017 04:48:23 EST Ventricular Rate:  130 PR Interval:    QRS Duration: 90 QT  Interval:  286 QTC Calculation: 421 R Axis:   88 Text Interpretation:  Sinus tachycardia Probable left atrial enlargement Repol abnrm suggests ischemia, diffuse leads Rate faster inferior and lateral ST depressions Confirmed by Ezequiel Essex [phone]) on 11/16/2017 5:09:16 AM       Radiology Dg Chest Portable 1 View  Result Date: 11/16/2017 CLINICAL DATA:  Acute onset shortness of breath. EXAM: PORTABLE CHEST 1 VIEW COMPARISON:  Chest radiograph August 24, 2017 FINDINGS: Cardiac silhouette is mildly enlarged and unchanged. Diffusely coarsened pulmonary interstitium with new patchy RIGHT middle lobe opacification. Increased lung volumes. No pleural effusion. Loop monitor projects over the heart. No pneumothorax. Soft tissue planes and included osseous structures are non suspicious. IMPRESSION: RIGHT middle lobe consolidation concerning for pneumonia. Followup PA and lateral chest X-ray is recommended in 3-4 weeks following trial of antibiotic therapy to ensure resolution and exclude underlying malignancy. Probable COPD. Stable cardiomegaly. Aortic Atherosclerosis (ICD10-I70.0). Electronically Signed   By: Elon Alas M.D.   On: 11/16/2017 05:23    Procedures Procedures (including critical care time)  Medications Ordered in ED Medications  ipratropium-albuterol (DUONEB) 0.5-2.5 (3) MG/3ML nebulizer solution 3 mL (3 mLs Nebulization Given 11/16/17 0513)     Initial Impression / Assessment and Plan / ED Course  I have reviewed the triage vital signs and the nursing notes.  Pertinent labs & imaging results that were available during my care of the patient were reviewed by me and considered in my medical decision making (see chart for details).    Acute onset of shortness of breath with hypoxia.  No chest pain, cough or fever.  EKG with inferior lateral ST depressions.  No wheezing on exam.  Patient will need to be evaluated for pulmonary embolism  Patient treated for pneumonia  based on x-ray findings.  She has persistent tachypnea and hypoxia requiring 6 L of oxygen to maintain her saturations.  She had minimal wheezing on exam after receiving nebulizers and steroids.  Upon returning from CT scan, patient had increased work of breathing with dyspnea and rales on lung exam.  She did receive 1 L of fluid only with a normal ejection fraction in November.  She was given Lasix, fluids were stopped and she was placed on BiPAP  No obvious pulmonary embolism seen on wet read of CT scan. Denies chest pain.  Repeat EKG shows improvement in inferior lateral ST depressions.  Patient continues to deny chest pain.  She is continued on IV heparin for apparent type II MI.  CT is negative for pulmonary embolism but does show bibasilar pneumonia concerning for aspiration.  Patient has improved on BiPAP with decreased tachypnea and work of breathing. \Repeat EKG shows improvement in ST depressions.  Repeat troponin pending.  Admission discussed with Dr. Roderic Palau.  CRITICAL CARE Performed by: Ezequiel Essex Total critical care time: 60 minutes Critical care time was exclusive of separately billable procedures and treating other patients. Critical care was necessary to treat or prevent imminent or life-threatening deterioration. Critical care was time spent personally by me on the following activities: development of treatment plan with patient and/or surrogate as well as nursing, discussions with consultants, evaluation of patient's response to treatment, examination of patient, obtaining history from patient or surrogate, ordering and performing treatments and interventions, ordering and review of laboratory studies, ordering and review of radiographic studies, pulse oximetry and re-evaluation of patient's condition.  Final Clinical Impressions(s) / ED Diagnoses   Final diagnoses:  Acute respiratory failure with hypoxia ([place])  Community acquired pneumonia of right lower lobe of lung  ([place])  Elevated troponin    ED Discharge Orders    None       Kaynen Minner, Annie Main, MD 11/16/17 [id number]

## 2017-11-16 NOTE — Progress Notes (Signed)
Paged triad to see if MD could come see pt and husband to explain plan. Cont to monitor. Carroll Kinds RN

## 2017-11-16 NOTE — ED Notes (Signed)
Pt placed on nonrebreather. O2 at 93% now.  Resp called for BIPAP

## 2017-11-16 NOTE — Progress Notes (Signed)
*  PRELIMINARY RESULTS* Echocardiogram 2D Echocardiogram has been performed.  [name] 11/16/2017, 2:34 PM

## 2017-11-16 NOTE — ED Notes (Signed)
Date and time results received: 11/16/17 6:44 AM Test: troponin Critical Value: 0.41 Name of Provider Notified: rancour Orders Received? Or Actions Taken?: md notified

## 2017-11-16 NOTE — ED Triage Notes (Signed)
Sob onset tonight, states she "just woke up and couldn't breathe"   No resp history.   Albuterol neb given per ems for wheezing.  Pt also received second duoneb and solumedrol.

## 2017-11-16 NOTE — ED Notes (Signed)
CRITICAL VALUE ALERT  Critical Value:  Troponin 10.50  Date & Time Notied:  11/16/17 1600  Provider Notified: Roderic Palau MD   Orders Received/Actions taken: n/a

## 2017-11-16 NOTE — ED Notes (Addendum)
CRITICAL VALUE ALERT  Critical Value:  Troponin 0.41  Date & Time Notied:  11/16/17 at [place]  Provider Notified: Rancour EDP  Orders Received/Actions taken: no orders at this time

## 2017-11-16 NOTE — Progress Notes (Signed)
ANTICOAGULATION CONSULT NOTE - initial  Pharmacy Consult for Heparin Indication: ACS/STEMI  Allergies  Allergen Reactions  . Lopressor [Metoprolol Tartrate]     Angioedema  . Lipitor [Atorvastatin] Diarrhea   Patient Measurements: Height: 5\' 2"  (157.5 cm) Weight: 125 lb (56.7 kg) IBW/kg (Calculated) : 50.1 HEPARIN DW (KG): 56.7  Vital Signs: Temp: 99.1 F (37.3 C) (02/21 0458) Temp Source: Oral (02/21 0458) BP: 106/69 (02/21 1530) Pulse Rate: 94 (02/21 1530)  Labs: Recent Labs    11/16/17 0528 11/16/17 0911 11/16/17 1509  HGB 12.8  --   --   HCT 40.8  --   --   PLT 367  --   --   APTT 40*  --   --   LABPROT 11.8  --   --   INR 0.87  --   --   CREATININE 0.65  --   --   TROPONINI 0.41* 3.02* 10.50*   Estimated Creatinine Clearance: 61.4 mL/min (by C-G formula based on SCr of 0.65 mg/dL).  Medical History: Past Medical History:  Diagnosis Date  . Hay fever   . 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 She has no prior respiratory history. Troponin is elevated. Pharmacy has been consulted for IV heparin dosing.  Pt received a dose of Lovenox 40mg  at 10:35.  Goal of Therapy:  Heparin level goal 0.3-0.7 unit/ml Monitor platelets by anticoagulation protocol: Yes   Plan:  Heparin  Bolus 2000 units IV Heparin infusion at 700 unit/hr Heparin level in 6-8 hours then daily  Ena Dawley, [facility] 11/16/2017,4:22 PM

## 2017-11-16 NOTE — Progress Notes (Signed)
Paged K Schorr NP to see if she could come see pt. Pt and family upset that they have not seen MD since arrival to [facility]. Pt wanting to eat and wants to know what procedure they may be having. Cont to monitor. Carroll Kinds RN

## 2017-11-16 NOTE — H&P (Addendum)
History and Physical    [name] JHE:[id number] DOB: 20-May-1961 DOA: 11/16/2017  PCP: [name], DO  Patient coming from: Home  I have personally briefly reviewed patient's old medical records in [place]  Chief Complaint: Shortness of breath  HPI: [name] is a 57 y.o. female with medical history significant of significant stroke in 07/2017.  She was in inpatient rehab until December 20, after which she returned home.  Patient reports that she is normally sore after therapy session and various muscle/joints.  This is been going on for several weeks after therapy.  Over the past week, she has had a cough, but attributes this to change in her antihistamine.  She has not had any fever.  She has not had any chest pain.  She has not had any vomiting.  She does occasionally have diarrhea.  Last night, she suddenly became short of breath.  This was improved with sitting up.  She has had some cough and wheezing.  She was brought to the ER for evaluation where she was noted to be significantly hypoxic.  She was placed on high flow nasal cannula but subsequently required BiPAP therapy.  Imaging indicates bilateral lower lobe pneumonia, suspicious for aspiration.  She did have a mild elevation of troponin at 0.4.  EKG did show some ST depressions in the anterolateral leads, but this improved after BiPAP was applied and respiratory status improved.  She is feeling better now since arriving to the emergency room.  She received antibiotics, nebulizer treatments, 1 dose of steroids.  She also received a dose of IV Lasix and had a mildly elevated BNP   Review of Systems: As per HPI otherwise 10 point review of systems negative.    Past Medical History:  Diagnosis Date  . Hay fever   . Stroke ([place]) 08/23/2017   right sided deficits.     Past Surgical History:  Procedure Laterality Date  . IR ANGIO INTRA EXTRACRAN SEL COM CAROTID INNOMINATE UNI R MOD SED  08/21/2017  . IR  ANGIO VERTEBRAL SEL SUBCLAVIAN INNOMINATE UNI R MOD SED  08/21/2017  . IR PERCUTANEOUS ART THROMBECTOMY/INFUSION INTRACRANIAL INC DIAG ANGIO  08/21/2017  . IR RADIOLOGIST EVAL & MGMT  10/30/2017  . LOOP RECORDER INSERTION N/A 08/28/2017   Procedure: LOOP RECORDER INSERTION;  Surgeon: Constance Haw, MD;  Location: [facility];  Service: Cardiovascular;  Laterality: N/A;  . RADIOLOGY WITH ANESTHESIA N/A 08/21/2017   Procedure: RADIOLOGY WITH ANESTHESIA;  Surgeon: Luanne Bras, MD;  Location: [facility];  Service: Radiology;  Laterality: N/A;  . TEE WITHOUT CARDIOVERSION N/A 08/28/2017   Procedure: TRANSESOPHAGEAL ECHOCARDIOGRAM (TEE);  Surgeon: Fay Records, MD;  Location: [facility] ENDOS[facility];  Service: Cardiovascular;  Laterality: N/A;     reports that she has quit smoking. Her smoking use included cigarettes. she has never used smokeless tobacco. She reports that she does not drink alcohol or use drugs.  Allergies  Allergen Reactions  . Lopressor [Metoprolol Tartrate]     Angioedema  . Adhesive [Tape]   . Lipitor [Atorvastatin] Diarrhea    Family History  Problem Relation Age of Onset  . Heart attack Father   . Hypertension Mother   . Hyperlipidemia Mother   . Diabetes type II Mother   . Hypertension Brother     Prior to Admission medications   Medication Sig Start Date End Date Taking? Authorizing Provider  aspirin 325 MG EC tablet Take 1 tablet (325 mg total)  by mouth daily. 10/13/17   [name], [name], DO  baclofen (LIORESAL) 10 MG tablet Take 0.5 tablets (5 mg total) by mouth 3 (three) times daily. 11/02/17 12/02/17  Jamse Arn, MD  escitalopram (LEXAPRO) 10 MG tablet Take 1 tablet (10 mg total) by mouth daily. 11/10/17   [name], [name], DO  famotidine (PEPCID) 20 MG tablet Take 1 tablet (20 mg total) by mouth 2 (two) times daily. 10/13/17   [name], [name], DO  hydrOXYzine (ATARAX/VISTARIL) 25 MG tablet Take 1 tablet (25 mg total) by mouth at bedtime. 11/10/17   [name],  [name], DO  rosuvastatin (CRESTOR) 20 MG tablet Take 1 tablet (20 mg total) by mouth daily. 10/13/17   [name], DO    Physical Exam: Vitals:   11/16/17 0729 11/16/17 0729 11/16/17 0742 11/16/17 0901  BP:      Pulse: (!) 133  (!) 111   Resp: (!) 34  18   Temp:      TempSrc:      SpO2: 92% 93% 97% 100%  Weight:      Height:        Constitutional: NAD, calm, comfortable Vitals:   11/16/17 0729 11/16/17 0729 11/16/17 0742 11/16/17 0901  BP:      Pulse: (!) 133  (!) 111   Resp: (!) 34  18   Temp:      TempSrc:      SpO2: 92% 93% 97% 100%  Weight:      Height:       Eyes: PERRL, lids and conjunctivae normal ENMT: Mucous membranes are moist. Posterior pharynx clear of any exudate or lesions.Normal dentition.  Neck: normal, supple, no masses, no thyromegaly Respiratory: Bilateral coarse breath sounds. Normal respiratory effort. No accessory muscle use.  Cardiovascular: Regular rate and rhythm, no murmurs / rubs / gallops. No extremity edema. 2+ pedal pulses. No carotid bruits.  Abdomen: no tenderness, no masses palpated. No hepatosplenomegaly. Bowel sounds positive.  Musculoskeletal: no clubbing / cyanosis. No joint deformity upper and lower extremities. Good ROM, no contractures. Normal muscle tone.  Skin: no rashes, lesions, ulcers. No induration Neurologic: Limited exam since patient is on BiPAP.  0-1 out of 5 strength in right upper extremity.  4-5 out of 5 in remaining extremities Psychiatric: Normal judgment and insight. Alert and oriented x 3. Normal mood.   Labs on Admission: I have personally reviewed following labs and imaging studies  CBC: Recent Labs  Lab 11/16/17 0528  WBC 10.7*  NEUTROABS 8.2*  HGB 12.8  HCT 40.8  MCV 94.2  PLT 657   Basic Metabolic Panel: Recent Labs  Lab 11/16/17 0528  NA 139  K 3.2*  CL 106  CO2 23  GLUCOSE 142*  BUN 22*  CREATININE 0.65  CALCIUM 8.9   GFR: Estimated Creatinine Clearance: 61.4 mL/min (by C-G formula  based on SCr of 0.65 mg/dL). Liver Function Tests: No results for input(s): AST, ALT, ALKPHOS, BILITOT, PROT, ALBUMIN in the last 168 hours. No results for input(s): LIPASE, AMYLASE in the last 168 hours. No results for input(s): AMMONIA in the last 168 hours. Coagulation Profile: Recent Labs  Lab 11/16/17 0528  INR 0.87   Cardiac Enzymes: Recent Labs  Lab 11/16/17 0528  TROPONINI 0.41*   BNP (last 3 results) No results for input(s): PROBNP in the last 8760 hours. HbA1C: No results for input(s): HGBA1C in the last 72 hours. CBG: No results for input(s): GLUCAP in the last 168 hours. Lipid  Profile: No results for input(s): CHOL, HDL, LDLCALC, TRIG, CHOLHDL, LDLDIRECT in the last 72 hours. Thyroid Function Tests: No results for input(s): TSH, T4TOTAL, FREET4, T3FREE, THYROIDAB in the last 72 hours. Anemia Panel: No results for input(s): VITAMINB12, FOLATE, FERRITIN, TIBC, IRON, RETICCTPCT in the last 72 hours. Urine analysis:    Component Value Date/Time   COLORURINE YELLOW 08/22/2017 [place] 08/22/2017 1526   LABSPEC 1.014 08/22/2017 1526   PHURINE 5.0 08/22/2017 1526   GLUCOSEU NEGATIVE 08/22/2017 1526   HGBUR NEGATIVE 08/22/2017 [place] 08/22/2017 [place] 08/22/2017 [place] 08/22/2017 1526   NITRITE NEGATIVE 08/22/2017 [place] 08/22/2017 1526    Radiological Exams on Admission: Ct Angio Chest Pe W And/or Wo Contrast  Result Date: 11/16/2017 CLINICAL DATA:  Suspect pulmonary embolism. Positive D-dimer. Short of breath. EXAM: CT ANGIOGRAPHY CHEST WITH CONTRAST TECHNIQUE: Multidetector CT imaging of the chest was performed using the standard protocol during bolus administration of intravenous contrast. Multiplanar CT image reconstructions and MIPs were obtained to evaluate the vascular anatomy. CONTRAST:  164mL ISOVUE-370 IOPAMIDOL (ISOVUE-370) INJECTION 76% COMPARISON:  Chest  x-ray 11/16/2017 FINDINGS: Cardiovascular: Negative for pulmonary embolism. Pulmonary arteries normal in caliber Atherosclerotic thoracic aorta without aneurysm. Aorta not significantly opacified. Atherosclerotic calcification in the coronary arteries. Left ventricular dilatation. No pericardial effusion. Mediastinum/Nodes: Negative for mass or adenopathy Lungs/Pleura: Dependent infiltrates in the lower lobes bilaterally suggestive of pneumonia. Small pleural effusions bilaterally. Upper Abdomen: Negative Musculoskeletal: Negative Review of the MIP images confirms the above findings. IMPRESSION: Negative for pulmonary embolism Dependent infiltrates in the lower lobes bilaterally, probable aspiration pneumonia. Small pleural effusions bilaterally. Left ventricular dilatation.  Coronary artery calcification. Aortic Atherosclerosis (ICD10-I70.0). Electronically Signed   By: Franchot Gallo M.D.   On: 11/16/2017 07:33   Dg Chest Portable 1 View  Result Date: 11/16/2017 CLINICAL DATA:  Acute onset shortness of breath. EXAM: PORTABLE CHEST 1 VIEW COMPARISON:  Chest radiograph August 24, 2017 FINDINGS: Cardiac silhouette is mildly enlarged and unchanged. Diffusely coarsened pulmonary interstitium with new patchy RIGHT middle lobe opacification. Increased lung volumes. No pleural effusion. Loop monitor projects over the heart. No pneumothorax. Soft tissue planes and included osseous structures are non suspicious. IMPRESSION: RIGHT middle lobe consolidation concerning for pneumonia. Followup PA and lateral chest X-ray is recommended in 3-4 weeks following trial of antibiotic therapy to ensure resolution and exclude underlying malignancy. Probable COPD. Stable cardiomegaly. Aortic Atherosclerosis (ICD10-I70.0). Electronically Signed   By: Elon Alas M.D.   On: 11/16/2017 05:23    EKG: Independently reviewed.  Initial EKG showed ST depressions in the anterolateral leads, and V3 through V6.  Follow-up EKG showed  improvement of ST depressions.  Assessment/Plan Active Problems:   Right hemiplegia (HCC)   Hypokalemia   Former smoker   Acute respiratory failure with hypoxia (HCC)   Aspiration pneumonia (HCC)   Elevated troponin    1. Acute respiratory failure with hypoxia.  Currently on BiPAP therapy.  Precipitated by underlying pneumonia.  CT chest negative for pulmonary embolus.  We will continue to wean off oxygen as tolerated. 2. Aspiration pneumonia.  May have some degree of dysphasia from previous stroke.  Will ask speech therapy to reevaluate.  Continue on antibiotic coverage with Unasyn.  Check sputum culture. 3. Elevated troponin.  Suspect this is demand ischemia in the setting of respiratory distress.  She did have some EKG changes, which improved after respiratory status began to  settle.  Continue to cycle troponins.  Check echocardiogram.  Continue on aspirin for now. 4. History of stroke.  She has residual right upper extremity hemiplegia.  Continue on aspirin and statin. 5. Hypokalemia.  Replace.  Check magnesium.  DVT prophylaxis: lovenox Code Status: full code Family Communication: discussed with brother and mother Disposition Plan: discharge home once improved Consults called:  Admission status: inpatient, stepdown   Kathie Dike MD [facility] Pager [phone]  If 7PM-7AM, please contact night-coverage [email or web address] Password [email or web address]  11/16/2017, 9:10 AM   Addendum 10:30:  Follow-up troponin elevated at 3.  She is also not having any chest pain.  Continues on BiPAP.  Will continue on aspirin, start on heparin infusion.  Consult cardiology.  Echocardiogram is ordered.  Kathie Dike   Addendum 11:30:  Case discussed with Dr. Domenic Polite, cardiology.  Recommendations are to transfer the patient to [facility] since she will likely need cardiac catheterization.  Continue on intravenous heparin, aspirin, statin for now.  She has a reported allergy to  beta-blockers, so she will be started on calcium channel blockers for heart rate control.  Echocardiogram is in process.  Stepdown bed requested at [facility].  Discussed with Dr. Lonny Prude at [facility].  [employer]

## 2017-11-17 ENCOUNTER — Inpatient Hospital Stay (HOSPITAL_COMMUNITY): Payer: BLUE CROSS/BLUE SHIELD

## 2017-11-17 DIAGNOSIS — E876 Hypokalemia: Secondary | ICD-10-CM

## 2017-11-17 DIAGNOSIS — I5021 Acute systolic (congestive) heart failure: Secondary | ICD-10-CM

## 2017-11-17 LAB — HIV ANTIBODY (ROUTINE TESTING W REFLEX): HIV SCREEN 4TH GENERATION: NONREACTIVE

## 2017-11-17 LAB — CBC
HCT: 36.5 % (ref 36.0–46.0)
Hemoglobin: 11.4 g/dL — ABNORMAL LOW (ref 12.0–15.0)
MCH: 29.2 pg (ref 26.0–34.0)
MCHC: 31.2 g/dL (ref 30.0–36.0)
MCV: 93.4 fL (ref 78.0–100.0)
Platelets: 335 10*3/uL (ref 150–400)
RBC: 3.91 MIL/uL (ref 3.87–5.11)
RDW: 14.3 % (ref 11.5–15.5)
WBC: 11.8 10*3/uL — ABNORMAL HIGH (ref 4.0–10.5)

## 2017-11-17 LAB — COMPREHENSIVE METABOLIC PANEL
ALK PHOS: 63 U/L (ref 38–126)
ALT: 26 U/L (ref 14–54)
AST: 64 U/L — AB (ref 15–41)
Albumin: 3.3 g/dL — ABNORMAL LOW (ref 3.5–5.0)
Anion gap: 10 (ref 5–15)
BILIRUBIN TOTAL: 0.5 mg/dL (ref 0.3–1.2)
BUN: 20 mg/dL (ref 6–20)
CALCIUM: 9 mg/dL (ref 8.9–10.3)
CO2: 24 mmol/L (ref 22–32)
Chloride: 108 mmol/L (ref 101–111)
Creatinine, Ser: 0.63 mg/dL (ref 0.44–1.00)
GFR calc Af Amer: >60 mL/min (ref >60)
GFR calc non Af Amer: >60 mL/min (ref >60)
GLUCOSE: 113 mg/dL — AB (ref 65–99)
Potassium: 4 mmol/L (ref 3.5–5.1)
SODIUM: 142 mmol/L (ref 135–145)
TOTAL PROTEIN: 6.2 g/dL — AB (ref 6.5–8.1)

## 2017-11-17 LAB — HEPARIN LEVEL (UNFRACTIONATED): HEPARIN UNFRACTIONATED: 0.51 [IU]/mL (ref 0.30–0.70)

## 2017-11-17 LAB — STREP PNEUMONIAE URINARY ANTIGEN: Strep Pneumo Urinary Antigen: NEGATIVE

## 2017-11-17 MED ORDER — ASPIRIN 300 MG RE SUPP
300.0000 mg | Freq: Every day | RECTAL | Status: DC
  Administered 2017-11-17 – 2017-11-18 (×2): 300 mg via RECTAL
  Filled 2017-11-17 (×2): qty 1

## 2017-11-17 MED ORDER — FUROSEMIDE 10 MG/ML IJ SOLN
40.0000 mg | Freq: Two times a day (BID) | INTRAMUSCULAR | Status: AC
  Administered 2017-11-17 – 2017-11-19 (×5): 40 mg via INTRAVENOUS
  Filled 2017-11-17 (×5): qty 4

## 2017-11-17 MED ORDER — FUROSEMIDE 10 MG/ML IJ SOLN
INTRAMUSCULAR | Status: AC
  Filled 2017-11-17: qty 4

## 2017-11-17 MED ORDER — ACETAMINOPHEN 325 MG PO TABS: 650.0000 mg | ORAL_TABLET | Freq: Four times a day (QID) | ORAL | Status: DC | PRN

## 2017-11-17 MED ORDER — LISINOPRIL 5 MG PO TABS
5.0000 mg | ORAL_TABLET | Freq: Every day | ORAL | Status: DC
  Administered 2017-11-18 – 2017-11-22 (×5): 5 mg via ORAL
  Filled 2017-11-17 (×5): qty 1

## 2017-11-17 MED ORDER — FUROSEMIDE 10 MG/ML IJ SOLN
40.0000 mg | Freq: Once | INTRAMUSCULAR | Status: AC
  Administered 2017-11-17: 40 mg via INTRAVENOUS

## 2017-11-17 NOTE — Progress Notes (Signed)
Respiratory therapy came and transitioned pt to Veni mask. 02 sats at 94%. Will cont to monitor pt.

## 2017-11-17 NOTE — Progress Notes (Signed)
SLP Cancellation Note  Patient Details Name: [name] MRN: [id number] DOB: November 01, 1960   Cancelled treatment:       Reason Eval/Treat Not Completed: Medical issues which prohibited therapy  The patient is on Bipap but per nurse is weaning.  Will try to check back later as schedule allows.  Shelly Flatten, MA, [facility] SLP [phone]  Lamar Sprinkles 11/17/2017, 9:50 AM

## 2017-11-17 NOTE — Progress Notes (Signed)
Pt's O2 sats dropped, work of breathing, hr, and respirations increased. Pt placed on BiPap

## 2017-11-17 NOTE — Progress Notes (Signed)
[place] for Heparin Indication: ACS/STEMI  Allergies  Allergen Reactions  . Lopressor [Metoprolol Tartrate]     Angioedema  . Lipitor [Atorvastatin] Diarrhea   Patient Measurements: Height: 5\' 2"  (157.5 cm) Weight: 136 lb 11 oz (62 kg) IBW/kg (Calculated) : 50.1 HEPARIN DW (KG): 56.7  Vital Signs: Temp: 98.2 F (36.8 C) (02/22 0753) Temp Source: Axillary (02/22 0753) BP: 144/87 (02/22 0753) Pulse Rate: 94 (02/22 0900)  Labs: Recent Labs    11/16/17 0528 11/16/17 0911 11/16/17 1509 11/16/17 2004 11/17/17 0508  HGB 12.8  --   --   --  11.4*  HCT 40.8  --   --   --  36.5  PLT 367  --   --   --  335  APTT 40*  --   --   --   --   LABPROT 11.8  --   --   --   --   INR 0.87  --   --   --   --   HEPARINUNFRC  --   --   --   --  0.51  CREATININE 0.65  --   --   --  0.63  TROPONINI 0.41* 3.02* 10.50* 6.71*  --    Estimated Creatinine Clearance: 67.2 mL/min (by C-G formula based on SCr of 0.63 mg/dL).   Assessment: 57 yo female continues on IV heparin for chest pain Heparin level therapeutic this AM CBC stable  Goal of Therapy:  Heparin level goal 0.3-0.7 unit/ml Monitor platelets by anticoagulation protocol: Yes   Plan:  Continue heparin at 700 units / hr Daily heparin level, CBC Follow up plans for cath  Thank you Anette Guarneri, PharmD [phone] 11/17/2017,10:13 AM

## 2017-11-17 NOTE — Progress Notes (Signed)
Pt stated she felt SOB. O2 sats 88%, Pt placed back on BIPAP. 02 sats at 91% on Bipap. Respiratory made aware. Will cont to monitor pt.

## 2017-11-17 NOTE — Progress Notes (Addendum)
Pt still on bipap. Spoke with Dr. Algis Liming and cardiology NP. Made them aware that pt was still on Bipap and waiting on a speech eval and that she has not received her PO medications today. New order received for an asprin suppository Will cont to monitor pt.

## 2017-11-17 NOTE — Progress Notes (Addendum)
PROGRESS NOTE   [name]  QVZ:[id number]    DOB: [birth year]    DOA: 11/16/2017  PCP: [name], DO   I have briefly reviewed patients previous medical records in [facility].  Brief Narrative:  57 year old female with PMH of stroke with residual right hemiparesis and 07/2017, completed rehab in December and returned home, presented initially to [facility] with few days history of cough without chest pain and then became suddenly short of breath on night prior to admission.  In ED, significantly hypoxic, required BiPAP, CTA chest negative for PE but showed bibasilar pneumonia suspicious for aspiration.  Course complicated by elevated troponin, echo with low EF, Cardiology evaluated for NSTEMI and recommended transfer to [facility] for eventual cardiac catheterization.  Intermittently requiring BiPAP.  Cardiology following.   Assessment & Plan:   Active Problems:   Right hemiplegia ([place])   Hypokalemia   Former smoker   Acute respiratory failure with hypoxia (HCC)   Aspiration pneumonia (HCC)   Elevated troponin   NSTEMI (non-ST elevated myocardial infarction) ([place])   1. Aspiration pneumonia: Likely from dysphagia due to recent stroke.  CTA chest 2/21: No PE but showed dependent infiltrates in both lower lobes.  Continue empirically started IV Unasyn.  Speech therapy for swallow evaluation. 2. Dysphagia: Suspected due to recent acute stroke.  Speech therapy for swallow evaluation.  Keep NPO until able to come off of BiPAP and do swallow eval due to concern for aspiration.  3. NSTEMI: No chest pain reported.  Troponin peaked to 10.5.  Continue IV heparin drip.  Discussed with Dr. Percival Spanish, eventual cath pending medical stabilization.  Continue aspirin, statin.  Allergic to beta blockers.  Cardizem discontinued due to low EF. 4. Cardiomyopathy: Cardiology following.  Starting ACEI. 5. Acute systolic CHF: TTE 12/22/49: LVEF 35-40%, diffuse hypokinesis and grade 2 diastolic  dysfunction.  IV Lasix increased to 40 mg twice daily.  ACEI started. 6. Acute respiratory failure with hypoxia: Due to aspiration pneumonia and acute CHF.  No PE by CT.  PRN BiPAP, oxygen.  Wean as tolerated for oxygen saturations >92%. 7. Normocytic anemia: No bleeding reported.  Follow CBCs. 8. Hypokalemia: Replaced.  Magnesium 1.9. 9. History of stroke with right hemiparesis: Continue aspirin and statins.   DVT prophylaxis: Currently on IV heparin drip. Code Status: Full. Family Communication: I discussed in detail with the spouse, updated care and answered questions. Disposition: To be determined pending clinical improvement and further hospital evaluation.   Consultants:  Cardiology  Procedures:  BiPAP  Antimicrobials:  Unasyn   Subjective: Overnight events noted.  Discussed with night RN, progressively hypoxic overnight and then dyspneic early this morning requiring BiPAP.  Also received a dose of IV Lasix for pulmonary edema noted on chest x-ray.  By the time I saw the patient at approximately 8 AM, feeling better, dyspnea improved.  No chest pain.  Cough with mild creamy sputum.  Right upper extremity weaker than lower extremity from recent stroke.  ROS: As above.  Objective:  Vitals:   11/17/17 1153 11/17/17 1215 11/17/17 1305 11/17/17 1317  BP: (!) 142/82     Pulse: (!) 109 (!) 115  (!) 111  Resp: (!) 25 (!) 28  15  Temp: 97.6 F (36.4 C)     TempSrc: Axillary     SpO2: 90% (!) 88% 94% 94%  Weight:      Height:        Examination:  General exam: Pleasant middle-aged female, moderately  built and nourished lying comfortably propped up in bed.  Had BiPAP on.  Did not appear in distress. Respiratory system: Diminished breath sounds in the bases with few bibasilar crackles.  Rest of lung fields clear to auscultation. Respiratory effort normal. Cardiovascular system: S1 & S2 heard, RRR. No JVD, murmurs, rubs, gallops or clicks. No pedal edema.  Telemetry personally  reviewed: Sinus rhythm. Gastrointestinal system: Abdomen is nondistended, soft and nontender. No organomegaly or masses felt. Normal bowel sounds heard. Central nervous system: Alert and oriented. No focal neurological deficits. Extremities: Left limbs grade 5 x 5 power.  Right upper extremity get 0 x 5 power.  Right lower extremity grade 4 x 5 power. Skin: No rashes, lesions or ulcers Psychiatry: Judgement and insight appear normal. Mood & affect appropriate.     Data Reviewed: I have personally reviewed following labs and imaging studies  CBC: Recent Labs  Lab 11/16/17 0528 11/17/17 0508  WBC 10.7* 11.8*  NEUTROABS 8.2*  --   HGB 12.8 11.4*  HCT 40.8 36.5  MCV 94.2 93.4  PLT 367 638   Basic Metabolic Panel: Recent Labs  Lab 11/16/17 0528 11/16/17 0911 11/17/17 0508  NA 139  --  142  K 3.2*  --  4.0  CL 106  --  108  CO2 23  --  24  GLUCOSE 142*  --  113*  BUN 22*  --  20  CREATININE 0.65  --  0.63  CALCIUM 8.9  --  9.0  MG  --  1.9  --    Liver Function Tests: Recent Labs  Lab 11/17/17 0508  AST 64*  ALT 26  ALKPHOS 63  BILITOT 0.5  PROT 6.2*  ALBUMIN 3.3*   Coagulation Profile: Recent Labs  Lab 11/16/17 0528  INR 0.87   Cardiac Enzymes: Recent Labs  Lab 11/16/17 0528 11/16/17 0911 11/16/17 1509 11/16/17 2004  TROPONINI 0.41* 3.02* 10.50* 6.71*     Recent Results (from the past 240 hour(s))  Blood culture (routine x 2)     Status: None (Preliminary result)   Collection Time: 11/16/17  5:32 AM  Result Value Ref Range Status   Specimen Description BLOOD LEFT ARM  Final   Special Requests   Final    BOTTLES DRAWN AEROBIC AND ANAEROBIC Blood Culture adequate volume   Culture   Final    NO GROWTH 1 DAY Performed at [facility], [address] [place], [place] [address]    Report Status PENDING  Incomplete  Blood culture (routine x 2)     Status: None (Preliminary result)   Collection Time: 11/16/17  5:37 AM  Result Value Ref Range Status     Specimen Description BLOOD LEFT ARM  Final   Special Requests   Final    Blood Culture results may not be optimal due to an inadequate volume of blood received in culture bottles BOTTLES DRAWN AEROBIC ONLY   Culture   Final    NO GROWTH 1 DAY Performed at [facility], [address], [place],  [address]    Report Status PENDING  Incomplete         Radiology Studies: Ct Angio Chest Pe W And/or Wo Contrast  Result Date: 11/16/2017 CLINICAL DATA:  Suspect pulmonary embolism. Positive D-dimer. Short of breath. EXAM: CT ANGIOGRAPHY CHEST WITH CONTRAST TECHNIQUE: Multidetector CT imaging of the chest was performed using the standard protocol during bolus administration of intravenous contrast. Multiplanar CT image reconstructions and MIPs were obtained to evaluate  the vascular anatomy. CONTRAST:  153mL ISOVUE-370 IOPAMIDOL (ISOVUE-370) INJECTION 76% COMPARISON:  Chest x-ray 11/16/2017 FINDINGS: Cardiovascular: Negative for pulmonary embolism. Pulmonary arteries normal in caliber Atherosclerotic thoracic aorta without aneurysm. Aorta not significantly opacified. Atherosclerotic calcification in the coronary arteries. Left ventricular dilatation. No pericardial effusion. Mediastinum/Nodes: Negative for mass or adenopathy Lungs/Pleura: Dependent infiltrates in the lower lobes bilaterally suggestive of pneumonia. Small pleural effusions bilaterally. Upper Abdomen: Negative Musculoskeletal: Negative Review of the MIP images confirms the above findings. IMPRESSION: Negative for pulmonary embolism Dependent infiltrates in the lower lobes bilaterally, probable aspiration pneumonia. Small pleural effusions bilaterally. Left ventricular dilatation.  Coronary artery calcification. Aortic Atherosclerosis (ICD10-I70.0). Electronically Signed   By: Franchot Gallo M.D.   On: 11/16/2017 07:33   Dg Chest Port 1 View  Result Date: 11/17/2017 CLINICAL DATA:  Shortness of Breath EXAM: PORTABLE CHEST 1 VIEW  COMPARISON:  11/16/2017 FINDINGS: Cardiomegaly with vascular congestion and diffuse interstitial prominence, likely interstitial edema. Continued right basilar focal airspace opacity which could reflect pneumonia. No visible effusions or acute bony abnormality. IMPRESSION: Cardiomegaly.  Suspect mild interstitial edema. Stable focal right basilar infiltrate concerning for pneumonia. Electronically Signed   By: Rolm Baptise M.D.   On: 11/17/2017 07:17   Dg Chest Portable 1 View  Result Date: 11/16/2017 CLINICAL DATA:  Acute onset shortness of breath. EXAM: PORTABLE CHEST 1 VIEW COMPARISON:  Chest radiograph August 24, 2017 FINDINGS: Cardiac silhouette is mildly enlarged and unchanged. Diffusely coarsened pulmonary interstitium with new patchy RIGHT middle lobe opacification. Increased lung volumes. No pleural effusion. Loop monitor projects over the heart. No pneumothorax. Soft tissue planes and included osseous structures are non suspicious. IMPRESSION: RIGHT middle lobe consolidation concerning for pneumonia. Followup PA and lateral chest X-ray is recommended in 3-4 weeks following trial of antibiotic therapy to ensure resolution and exclude underlying malignancy. Probable COPD. Stable cardiomegaly. Aortic Atherosclerosis (ICD10-I70.0). Electronically Signed   By: Elon Alas M.D.   On: 11/16/2017 05:23        Scheduled Meds: . aspirin  325 mg Oral Daily  . baclofen  5 mg Oral TID  . escitalopram  10 mg Oral Daily  . famotidine  20 mg Oral BID  . furosemide  40 mg Intravenous BID  . guaiFENesin  1,200 mg Oral BID  . hydrOXYzine  25 mg Oral QHS  . ipratropium-albuterol  3 mL Nebulization Q6H  . lisinopril  5 mg Oral Daily  . rosuvastatin  20 mg Oral Daily  . sodium chloride flush  3 mL Intravenous Q12H   Continuous Infusions: . sodium chloride    . ampicillin-sulbactam (UNASYN) IV Stopped (11/17/17 1200)  . heparin 700 Units/hr (11/16/17 1225)     LOS: 1 day     Vernell Leep, MD, FACP, [facility]. [facility] Pager [phone]  If 7PM-7AM, please contact night-coverage [email or web address] Password [facility] 11/17/2017, 1:42 PM

## 2017-11-17 NOTE — Progress Notes (Signed)
RT has been weaning patient's FIO2 from 100%. The patient was weaned to 60% but did not tolerate weaning to 50% as the patient's o2 saturation dropped to 88%. Will continue to monitor for opportunities to wean patient's settings.

## 2017-11-17 NOTE — Progress Notes (Signed)
Placed patient on BIPAP due to increased work of breathing and a decreased in SP02 level. Rapid response at bedside and x-ray pending.

## 2017-11-17 NOTE — Progress Notes (Addendum)
Progress Note  Patient Name: [name] Date of Encounter: 11/17/2017  Primary Cardiologist: No primary care provider on file.   Subjective   Pt resting in bed on BiPap. She had shortness of breath over night and was placed back on BiPap. She denies chest discomfort throughout this whole illness. On Wednesday she had had a vigorous session with PT and was sore. She rested for the rest of the day. She awoke in the early morning hours the next day with difficulty breathing.   Inpatient Medications    Scheduled Meds: . aspirin  325 mg Oral Daily  . baclofen  5 mg Oral TID  . diltiazem  30 mg Oral Q8H  . escitalopram  10 mg Oral Daily  . famotidine  20 mg Oral BID  . guaiFENesin  1,200 mg Oral BID  . hydrOXYzine  25 mg Oral QHS  . ipratropium-albuterol  3 mL Nebulization Q6H  . rosuvastatin  20 mg Oral Daily  . sodium chloride flush  3 mL Intravenous Q12H   Continuous Infusions: . sodium chloride    . ampicillin-sulbactam (UNASYN) IV Stopped (11/17/17 0644)  . heparin 700 Units/hr (11/16/17 1225)   PRN Meds: sodium chloride, albuterol, pneumococcal 23 valent vaccine, sodium chloride flush   Vital Signs    Vitals:   11/17/17 0753 11/17/17 0835 11/17/17 0853 11/17/17 0900  BP: (!) 144/87     Pulse: (!) 111 (!) 127 (!) 103 94  Resp: (!) 24 20 (!) 23 (!) 25  Temp: 98.2 F (36.8 C)     TempSrc: Axillary     SpO2: 99% 100% 99% 98%  Weight:      Height:        Intake/Output Summary (Last 24 hours) at 11/17/2017 0907 Last data filed at 11/17/2017 0524 Gross per 24 hour  Intake 709.08 ml  Output 300 ml  Net 409.08 ml   Filed Weights   11/16/17 0449 11/17/17 0531  Weight: 125 lb (56.7 kg) 136 lb 11 oz (62 kg)    Telemetry    SR/ST in the 90's-120's.  - Personally Reviewed  ECG    Poor tracing, ST at 127 bpm, possibly inferolateral non-specific ST changes - Personally Reviewed  Physical Exam    GEN: On BiPap without acute distress.   Neck: No  JVD Cardiac: tachycardic, no murmurs, rubs, or gallops.  Respiratory: Lung sounds decreased with few scattered crackles and end expiratory wheezes in RLL GI: Soft, nontender, non-distended  MS: No edema; No deformity. Neuro:  Right sided weakness Psych: Normal affect   Labs    Chemistry Recent Labs  Lab 11/16/17 0528 11/17/17 0508  NA 139 142  K 3.2* 4.0  CL 106 108  CO2 23 24  GLUCOSE 142* 113*  BUN 22* 20  CREATININE 0.65 0.63  CALCIUM 8.9 9.0  PROT  --  6.2*  ALBUMIN  --  3.3*  AST  --  64*  ALT  --  26  ALKPHOS  --  63  BILITOT  --  0.5  GFRNONAA >60 >60  GFRAA >60 >60  ANIONGAP 10 10     Hematology Recent Labs  Lab 11/16/17 0528 11/17/17 0508  WBC 10.7* 11.8*  RBC 4.33 3.91  HGB 12.8 11.4*  HCT 40.8 36.5  MCV 94.2 93.4  MCH 29.6 29.2  MCHC 31.4 31.2  RDW 13.9 14.3  PLT 367 335    Cardiac Enzymes Recent Labs  Lab 11/16/17 0528 11/16/17 0911 11/16/17 1509 11/16/17 2004  TROPONINI 0.41* 3.02* 10.50* 6.71*   No results for input(s): TROPIPOC in the last 168 hours.   BNP Recent Labs  Lab 11/16/17 0528  BNP 675.0*     DDimer  Recent Labs  Lab 11/16/17 0528  DDIMER 1.08*     Radiology    Ct Angio Chest Pe W And/or Wo Contrast  Result Date: 11/16/2017 CLINICAL DATA:  Suspect pulmonary embolism. Positive D-dimer. Short of breath. EXAM: CT ANGIOGRAPHY CHEST WITH CONTRAST TECHNIQUE: Multidetector CT imaging of the chest was performed using the standard protocol during bolus administration of intravenous contrast. Multiplanar CT image reconstructions and MIPs were obtained to evaluate the vascular anatomy. CONTRAST:  123mL ISOVUE-370 IOPAMIDOL (ISOVUE-370) INJECTION 76% COMPARISON:  Chest x-ray 11/16/2017 FINDINGS: Cardiovascular: Negative for pulmonary embolism. Pulmonary arteries normal in caliber Atherosclerotic thoracic aorta without aneurysm. Aorta not significantly opacified. Atherosclerotic calcification in the coronary arteries. Left  ventricular dilatation. No pericardial effusion. Mediastinum/Nodes: Negative for mass or adenopathy Lungs/Pleura: Dependent infiltrates in the lower lobes bilaterally suggestive of pneumonia. Small pleural effusions bilaterally. Upper Abdomen: Negative Musculoskeletal: Negative Review of the MIP images confirms the above findings. IMPRESSION: Negative for pulmonary embolism Dependent infiltrates in the lower lobes bilaterally, probable aspiration pneumonia. Small pleural effusions bilaterally. Left ventricular dilatation.  Coronary artery calcification. Aortic Atherosclerosis (ICD10-I70.0). Electronically Signed   By: Franchot Gallo M.D.   On: 11/16/2017 07:33   Dg Chest Port 1 View  Result Date: 11/17/2017 CLINICAL DATA:  Shortness of Breath EXAM: PORTABLE CHEST 1 VIEW COMPARISON:  11/16/2017 FINDINGS: Cardiomegaly with vascular congestion and diffuse interstitial prominence, likely interstitial edema. Continued right basilar focal airspace opacity which could reflect pneumonia. No visible effusions or acute bony abnormality. IMPRESSION: Cardiomegaly.  Suspect mild interstitial edema. Stable focal right basilar infiltrate concerning for pneumonia. Electronically Signed   By: Rolm Baptise M.D.   On: 11/17/2017 07:17   Dg Chest Portable 1 View  Result Date: 11/16/2017 CLINICAL DATA:  Acute onset shortness of breath. EXAM: PORTABLE CHEST 1 VIEW COMPARISON:  Chest radiograph August 24, 2017 FINDINGS: Cardiac silhouette is mildly enlarged and unchanged. Diffusely coarsened pulmonary interstitium with new patchy RIGHT middle lobe opacification. Increased lung volumes. No pleural effusion. Loop monitor projects over the heart. No pneumothorax. Soft tissue planes and included osseous structures are non suspicious. IMPRESSION: RIGHT middle lobe consolidation concerning for pneumonia. Followup PA and lateral chest X-ray is recommended in 3-4 weeks following trial of antibiotic therapy to ensure resolution and  exclude underlying malignancy. Probable COPD. Stable cardiomegaly. Aortic Atherosclerosis (ICD10-I70.0). Electronically Signed   By: Elon Alas M.D.   On: 11/16/2017 05:23    Cardiac Studies   Echocardiogram 11/16/17 Study Conclusions - Left ventricle: The cavity size was mildly dilated. Wall   thickness was increased in a pattern of mild LVH. Systolic   function was moderately reduced. The estimated ejection fraction   was in the range of 35% to 40%. Diffuse hypokinesis. There is   akinesis of the basal-midinferolateral, inferior, and   inferoseptal myocardium. Features are consistent with a   pseudonormal left ventricular filling pattern, with concomitant   abnormal relaxation and increased filling pressure (grade 2   diastolic dysfunction). - Mitral valve: Mildly thickened leaflets . There was mild to   moderate regurgitation. - Left atrium: The atrium was mildly dilated. - Right atrium: Central venous pressure (est): 8 mm Hg. - Atrial septum: No defect or patent foramen ovale was identified. - Tricuspid valve: There was trivial regurgitation. - Pulmonary arteries: Systolic pressure  could not be accurately   estimated. - Pericardium, extracardiac: There was no pericardial effusion.  Echo 08/22/17:  EF 55-60%, normal wall motion, mild MR, mod biatrial enlargement, dilated IVC  TEE 08/28/2017: Left ventricle: LVEF appears depressed. Aortic valve: AV is mildly thickened. Triv AI. Aorta: Moderate to large amount of fixed atheroscleroitc plquing in thoracic aorta. Left atrium: No evidence of thrombus in the atrial cavity or appendage. Atrial septum: No PFO by color doppler or with injection of agitated saline. Right ventricle: RVEF is normal Pulmonic valve: PV is normal. Tricuspid valve: TV is normal Trivial TR  Patient Profile     57 y.o. female with a history of left MCA stroke in November 2018 and tobacco use who is being seen today for the evaluation of  shortness of breath and abnormal troponin I levels presented to [facility] on 2/21 and found to have NSTEMI and possible aspiration pneumonia. She was transferred to [facility] as will likely need cardiac catheterization.   Assessment & Plan    NSTEMI:  -No previous cardiac hx. Has loop recorder in place after stroke in 07/2017. -In setting of pneumonia -EKG with new inferolateral ST depression -Troponin initially 0.41 up to 10.5 and trending down, 6.71. BNP was 675.0 -Pt denies any chest pain. Awoke with sudden shortness of breath early yesterday morning. Felt well the day before except body aches after vigorous physical therapy session.  -Still having shortness of breath and hypoxia, continues on Bipap.  -Echo shows decrease in EF from 55-60% in 07/2017 to 35% to 40% with Diffuse hypokinesis and akinesis of the basal-midinferolateral, inferior, and inferoseptal myocardium. -The patient will need cardiac cath to evaluate coronary arteries as soon as breathing improves and able to tolerate.  -Normal renal function.  -She is anticoagulated with IV heparin. On aspirin 325 mg after her stroke. On statin. Hx of severe allergy to lopressor per notes. She was started on short acting cardizem for general heart rate control in setting of ischemia. Will discuss discontinuation of this with reduced EF with Dr. Percival Spanish.   Cardiomyopathy -Possible ischemic related to current NSTEMI -Echo shows decrease in EF from 55-60% in 07/2017 to 35% to 40% with Diffuse hypokinesis and akinesis of the basal-midinferolateral, inferior, and inferoseptal myocardium. -BNP 675 on admission. -Does not appear clinically significantly volume overloaded except for her breathing difficulties.   Acute respiratory failure/pneumonia -presented with shortness of breath and hypoxia. CXR suggested RML infiltrate. CT chest showed dependent infiltrates in the lower lobes suggestive of possible aspiration as well as small pleural effusions.    -Negative for PE on CT chest -Management per IM on IV antibiotics.  -Pt weaned from BiPap yesterday but was short of breath overnight with mild hypoxia, BiPap resumed.  -Repeat CXR this am showed mild interstitial edema and stable focal basilar infiltrate concerning for pneumonia.  Hx stroke -Left MCA stroke treated with TPA and thrombectomy in 07/2017.  -Has residual right sided weakness. Doing outpatient physical therapy.  -On Aspirin 325 mg and statin.  For questions or updates, please contact [place] Please consult [email or web address] for contact info under Cardiology/STEMI.      Signed, Daune Perch, NP  11/17/2017, 9:07 AM     History and all data above reviewed.  Patient examined.  I agree with the findings as above.  She has respiratory distress improved with BiPAP and Lasix.  No chest pain.   The patient exam reveals COR:RRR  ,  Lungs: Decreased breath sounds with bilateral basilar crackles  ,  Abd: Positive bowel sounds, no rebound no guarding, Ext No edema  .  All available labs, radiology testing, previous records reviewed. Agree with documented assessment and plan. NSTEMI:  Reduced EF.  Eventual cath but for this weekend treat probable aspiration pneumonia per the primary team.  IV Lasix.  Start ACE inhibitor.  I will stop the Cardizem which was started for rate control in the face of beta blocker allergy before her EF was known.  Continue ASA and heparin.    Jeneen Rinks Meda Dudzinski  1:24 PM  11/17/2017

## 2017-11-17 NOTE — Progress Notes (Signed)
Called to bedside for a pt with increased WOB and worsening hypoxia this AM.  On my arrival, RT was placing pt on Bipap.  Pt was in respiratory distress with tachypnea, tachycardia, and accessory muscle use.  Mental status intact, remains alert and oriented during encounter, following commands.  Schorr, NP contacted by primary RN, order received for PCXR, EKG and, Lasix 40 mg.    Pt was placed on our radar, we will continue to monitor.

## 2017-11-18 DIAGNOSIS — J690 Pneumonitis due to inhalation of food and vomit: Secondary | ICD-10-CM

## 2017-11-18 LAB — BASIC METABOLIC PANEL
Anion gap: 12 (ref 5–15)
BUN: 20 mg/dL (ref 6–20)
CALCIUM: 9 mg/dL (ref 8.9–10.3)
CO2: 28 mmol/L (ref 22–32)
Chloride: 101 mmol/L (ref 101–111)
Creatinine, Ser: 0.64 mg/dL (ref 0.44–1.00)
GFR calc non Af Amer: >60 mL/min (ref >60)
Glucose, Bld: 81 mg/dL (ref 65–99)
Potassium: 3.2 mmol/L — ABNORMAL LOW (ref 3.5–5.1)
Sodium: 141 mmol/L (ref 135–145)

## 2017-11-18 LAB — HEPARIN LEVEL (UNFRACTIONATED)
HEPARIN UNFRACTIONATED: 0.15 [IU]/mL — AB (ref 0.30–0.70)
Heparin Unfractionated: 0.4 IU/mL (ref 0.30–0.70)

## 2017-11-18 LAB — BRAIN NATRIURETIC PEPTIDE: B Natriuretic Peptide: 1276.7 pg/mL — ABNORMAL HIGH (ref 0.0–100.0)

## 2017-11-18 LAB — LEGIONELLA PNEUMOPHILA SEROGP 1 UR AG: L. pneumophila Serogp 1 Ur Ag: NEGATIVE

## 2017-11-18 MED ORDER — IPRATROPIUM-ALBUTEROL 0.5-2.5 (3) MG/3ML IN SOLN
3.0000 mL | Freq: Three times a day (TID) | RESPIRATORY_TRACT | Status: DC
  Administered 2017-11-18: 3 mL via RESPIRATORY_TRACT
  Filled 2017-11-18: qty 3

## 2017-11-18 MED ORDER — RESOURCE THICKENUP CLEAR PO POWD
ORAL | Status: DC | PRN
  Filled 2017-11-18 (×2): qty 125

## 2017-11-18 MED ORDER — IPRATROPIUM-ALBUTEROL 0.5-2.5 (3) MG/3ML IN SOLN
3.0000 mL | Freq: Two times a day (BID) | RESPIRATORY_TRACT | Status: DC
  Administered 2017-11-18 – 2017-11-24 (×11): 3 mL via RESPIRATORY_TRACT
  Filled 2017-11-18 (×12): qty 3

## 2017-11-18 MED ORDER — ASPIRIN EC 325 MG PO TBEC
325.0000 mg | DELAYED_RELEASE_TABLET | Freq: Every day | ORAL | Status: DC
  Administered 2017-11-19 – 2017-11-22 (×4): 325 mg via ORAL
  Filled 2017-11-18 (×4): qty 1

## 2017-11-18 MED ORDER — HEPARIN BOLUS VIA INFUSION
2000.0000 [IU] | Freq: Once | INTRAVENOUS | Status: AC
  Administered 2017-11-18: 2000 [IU] via INTRAVENOUS
  Filled 2017-11-18: qty 2000

## 2017-11-18 MED ORDER — STARCH (THICKENING) PO POWD
ORAL | Status: DC | PRN
  Filled 2017-11-18: qty 227

## 2017-11-18 MED ORDER — POTASSIUM CHLORIDE CRYS ER 20 MEQ PO TBCR
40.0000 meq | EXTENDED_RELEASE_TABLET | Freq: Two times a day (BID) | ORAL | Status: AC
  Administered 2017-11-18 – 2017-11-19 (×4): 40 meq via ORAL
  Filled 2017-11-18 (×4): qty 2

## 2017-11-18 NOTE — Progress Notes (Signed)
ANTICOAGULATION CONSULT NOTE - Follow Up Consult  Pharmacy Consult for heparin Indication: NSTEMI  Labs: Recent Labs    11/16/17 0528 11/16/17 0911 11/16/17 1509 11/16/17 2004 11/17/17 0508 11/18/17 0350  HGB 12.8  --   --   --  11.4*  --   HCT 40.8  --   --   --  36.5  --   PLT 367  --   --   --  335  --   APTT 40*  --   --   --   --   --   LABPROT 11.8  --   --   --   --   --   INR 0.87  --   --   --   --   --   HEPARINUNFRC  --   --   --   --  0.51 <0.10*  CREATININE 0.65  --   --   --  0.63 0.64  TROPONINI 0.41* 3.02* 10.50* 6.71*  --   --     Assessment: 56yo female undetectable on heparin after one level at goal; no gtt issues per RN.  Goal of Therapy:  Heparin level 0.3-0.7 units/ml   Plan:  Will increase heparin gtt by 3 units/kg/hr to 900 units/hr and check level in 6 hours.    Wynona Neat, PharmD, BCPS  11/18/2017,6:13 AM

## 2017-11-18 NOTE — Progress Notes (Signed)
Progress Note  Patient Name: [name] Date of Encounter: 11/18/2017  Consulting Cardiologist: Dr. Satira Sark  Subjective   Breathing more easily, not on BiPAP since yesterday.  No chest pain at present.  No palpitations.  Inpatient Medications    Scheduled Meds: . aspirin  300 mg Rectal Daily  . baclofen  5 mg Oral TID  . escitalopram  10 mg Oral Daily  . famotidine  20 mg Oral BID  . furosemide  40 mg Intravenous BID  . guaiFENesin  1,200 mg Oral BID  . hydrOXYzine  25 mg Oral QHS  . ipratropium-albuterol  3 mL Nebulization BID  . lisinopril  5 mg Oral Daily  . potassium chloride  40 mEq Oral BID  . rosuvastatin  20 mg Oral Daily  . sodium chloride flush  3 mL Intravenous Q12H   Continuous Infusions: . sodium chloride    . ampicillin-sulbactam (UNASYN) IV Stopped (11/18/17 0940)  . heparin 900 Units/hr (11/18/17 0626)   PRN Meds: sodium chloride, albuterol, pneumococcal 23 valent vaccine, RESOURCE THICKENUP CLEAR, sodium chloride flush   Vital Signs    Vitals:   11/18/17 0427 11/18/17 0820 11/18/17 0916 11/18/17 1156  BP: (!) 142/61 (!) 144/76  103/72  Pulse: 97 94  94  Resp: 18 (!) 24    Temp: 97.7 F (36.5 C) 97.6 F (36.4 C)  98.8 F (37.1 C)  TempSrc: Axillary Axillary  Oral  SpO2: 99% 100% 98% 96%  Weight: 143 lb 4.8 oz (65 kg)     Height:        Intake/Output Summary (Last 24 hours) at 11/18/2017 1344 Last data filed at 11/18/2017 1100 Gross per 24 hour  Intake 0 ml  Output 2850 ml  Net -2850 ml   Filed Weights   11/16/17 0449 11/17/17 0531 11/18/17 0427  Weight: 125 lb (56.7 kg) 136 lb 11 oz (62 kg) 143 lb 4.8 oz (65 kg)    Telemetry    Sinus rhythm.  Personally reviewed.  ECG    Tracing from 11/17/2017 showed sinus tachycardia with diffuse ST-T wave abnormalities.  Personally reviewed.  Physical Exam   GEN:  Chronically ill-appearing woman.  No acute distress.   Neck: No JVD. Cardiac: RRR, no gallop.  Respiratory:   Scattered rhonchi, no wheezing. GI: Soft, nontender, bowel sounds present. MS: No edema; No deformity. Neuro:  Right leg weakness and right arm paresis. Psych: Alert and oriented x 3. Normal affect.  Labs    Chemistry Recent Labs  Lab 11/16/17 0528 11/17/17 0508 11/18/17 0350  NA 139 142 141  K 3.2* 4.0 3.2*  CL 106 108 101  CO2 23 24 28   GLUCOSE 142* 113* 81  BUN 22* 20 20  CREATININE 0.65 0.63 0.64  CALCIUM 8.9 9.0 9.0  PROT  --  6.2*  --   ALBUMIN  --  3.3*  --   AST  --  64*  --   ALT  --  26  --   ALKPHOS  --  63  --   BILITOT  --  0.5  --   GFRNONAA >60 >60 >60  GFRAA >60 >60 >60  ANIONGAP 10 10 12      Hematology Recent Labs  Lab 11/16/17 0528 11/17/17 0508  WBC 10.7* 11.8*  RBC 4.33 3.91  HGB 12.8 11.4*  HCT 40.8 36.5  MCV 94.2 93.4  MCH 29.6 29.2  MCHC 31.4 31.2  RDW 13.9 14.3  PLT 367 335  Cardiac Enzymes Recent Labs  Lab 11/16/17 0528 11/16/17 0911 11/16/17 1509 11/16/17 2004  TROPONINI 0.41* 3.02* 10.50* 6.71*   No results for input(s): TROPIPOC in the last 168 hours.   BNP Recent Labs  Lab 11/16/17 0528 11/18/17 0350  BNP 675.0* 1,276.7*     DDimer  Recent Labs  Lab 11/16/17 0528  DDIMER 1.08*     Radiology    Dg Chest Port 1 View  Result Date: 11/17/2017 CLINICAL DATA:  Shortness of Breath EXAM: PORTABLE CHEST 1 VIEW COMPARISON:  11/16/2017 FINDINGS: Cardiomegaly with vascular congestion and diffuse interstitial prominence, likely interstitial edema. Continued right basilar focal airspace opacity which could reflect pneumonia. No visible effusions or acute bony abnormality. IMPRESSION: Cardiomegaly.  Suspect mild interstitial edema. Stable focal right basilar infiltrate concerning for pneumonia. Electronically Signed   By: Rolm Baptise M.D.   On: 11/17/2017 07:17    Cardiac Studies   Echocardiogram 11/16/2017: Study Conclusions  - Left ventricle: The cavity size was mildly dilated. Wall   thickness was increased in  a pattern of mild LVH. Systolic   function was moderately reduced. The estimated ejection fraction   was in the range of 35% to 40%. Diffuse hypokinesis. There is   akinesis of the basal-midinferolateral, inferior, and   inferoseptal myocardium. Features are consistent with a   pseudonormal left ventricular filling pattern, with concomitant   abnormal relaxation and increased filling pressure (grade 2   diastolic dysfunction). - Mitral valve: Mildly thickened leaflets . There was mild to   moderate regurgitation. - Left atrium: The atrium was mildly dilated. - Right atrium: Central venous pressure (est): 8 mm Hg. - Atrial septum: No defect or patent foramen ovale was identified. - Tricuspid valve: There was trivial regurgitation. - Pulmonary arteries: Systolic pressure could not be accurately   estimated. - Pericardium, extracardiac: There was no pericardial effusion.  Patient Profile     57 y.o. 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  Assessment & Plan    1.  NSTEMI, peak troponin I 10.5 and trending down.  No active chest pain.  2.  Probable ischemic cardiomyopathy, LVEF 35-40% range with wall motion abnormalities as outlined above.  3.  History of left MCA stroke in November 2018 status post treatment with TPA and thrombectomy.  She has residual right-sided weakness, although has made some improvements with rehabilitation.  4.  Possible aspiration pneumonia, being managed by primary team and on broad-spectrum antibiotics.  Not requiring BiPAP at this point.  Plan diagnostic cardiac catheterization early next week presuming respiratory status continues to improve.  Continue aspirin, lisinopril, and Crestor.  She is not on beta-blocker due to allergy and was taken off calcium channel blocker in light of  cardiomyopathy.  Continue IV Lasix with potassium supplements.  Signed, [name], MD  11/18/2017, 1:44 PM

## 2017-11-18 NOTE — Evaluation (Addendum)
Clinical/Bedside Swallow Evaluation Patient Details  Name: [name] MRN: [id number] Date of Birth: 03-17-1961  Today's Date: 11/18/2017 Time: SLP Start Time (ACUTE ONLY): 1015 SLP Stop Time (ACUTE ONLY): 1050 SLP Time Calculation (min) (ACUTE ONLY): 35 min  Past Medical History:  Past Medical History:  Diagnosis Date  . Arthritis    "knees" (11/16/2017)  . Hay fever   . Pneumonia 11/16/2017  . Stroke [facility]) 08/23/2017   Left MCA infarct status post TPA and thrombectomy   Past Surgical History:  Past Surgical History:  Procedure Laterality Date  . CRYOABLATION     cervical  . IR ANGIO INTRA EXTRACRAN SEL COM CAROTID INNOMINATE UNI R MOD SED  08/21/2017  . IR ANGIO VERTEBRAL SEL SUBCLAVIAN INNOMINATE UNI R MOD SED  08/21/2017  . IR PERCUTANEOUS ART THROMBECTOMY/INFUSION INTRACRANIAL INC DIAG ANGIO  08/21/2017  . IR RADIOLOGIST EVAL & MGMT  10/30/2017  . LOOP RECORDER INSERTION N/A 08/28/2017   Procedure: LOOP RECORDER INSERTION;  Surgeon: Constance Haw, MD;  Location: [place] [facility];  Service: Cardiovascular;  Laterality: N/A;  . RADIOLOGY WITH ANESTHESIA N/A 08/21/2017   Procedure: RADIOLOGY WITH ANESTHESIA;  Surgeon: Luanne Bras, MD;  Location: [place];  Service: Radiology;  Laterality: N/A;  . TEE WITHOUT CARDIOVERSION N/A 08/28/2017   Procedure: TRANSESOPHAGEAL ECHOCARDIOGRAM (TEE);  Surgeon: Fay Records, MD;  Location: [facility] ENDOSCOPY;  Service: Cardiovascular;  Laterality: N/A;  . TUBAL LIGATION     HPI:  [name] a 57 y.o.femalewith medical history significant ofsignificant stroke in 07/2017. She was in inpatient rehab until December 20, after which she returned home. She presented to ED with SOB where she was noted to be significantly hypoxic. She was placed on high flow nasal cannula but subsequently required BiPAP therapy. Imaging indicates bilateral lower lobe pneumonia, suspicious for aspiration.Course complicated by elevated troponin,  echo with low EF, Cardiology evaluated for NSTEMI and recommended transfer to [facility] for eventual cardiac catheterization.  Intermittently requiring BiPAP. Pt had MBS 08/25/18 showing moderate oral and mild pharyngeal dysphagia mostly characterized by decreased oral transiting coordination resulting premature spillage of boluses into pharynx; dys 3 with thin liquids and chin tuck was recommended. Progressed to regular textures, thin liquids in CIR and was drinking with head neutral position with no overt s/sx of aspiration.    Assessment / Plan / Recommendation Clinical Impression   Patient presents with clinical signs of aspiration with larger sips of thin liquids, even with use of chin tuck. Subtle immediate throat clearing, multiple swallows are suggestive of reduced airway protection. Pt denies coughing with PO intake at home, however review of pt's MBS following CVA revealed aspiration of thin liquids was silent. Per MBS, pharyngeal phase with nectar thick liquids was [facility], and there are no overt signs of aspiration with nectar trials at bedside. Pt remains impulsive with liquids, though she does verbalize recognition that "I probably took too big a sip." Extensive education provided to pt and husband re: risks for aspiration increased given her current condition, and encouraged husband to remove environmental distractions and assist with cuing pt to take small bites and sips. Recommend dys 3, nectar thick liquids, meds whole in puree pending MBS to further evaluate swallowing function, given pt's respiratory status and RLL infiltrate noted on CXR.    SLP Visit Diagnosis: Dysphagia, oropharyngeal phase (R13.12)    Aspiration Risk  Moderate aspiration risk    Diet Recommendation Dysphagia 3 (Mech soft);Nectar-thick liquid   Liquid Administration via: Cup  Medication Administration: Whole meds with puree Supervision: Patient able to self feed;Intermittent supervision to cue for compensatory  strategies Compensations: Slow rate;Small sips/bites;Minimize environmental distractions    Other  Recommendations Oral Care Recommendations: Oral care BID Other Recommendations: Order thickener from pharmacy;Prohibited food (jello, ice cream, thin soups);Remove water pitcher   Follow up Recommendations Other (comment)(tbd)      Frequency and Duration min 2x/week  2 weeks       Prognosis Prognosis for Safe Diet Advancement: Good Barriers to Reach Goals: Cognitive deficits      Swallow Study   General Date of Onset: 11/16/17 HPI: [name] a 57 y.o.femalewith medical history significant ofsignificant stroke in 07/2017. She was in inpatient rehab until December 20, after which she returned home. She presented to ED with SOB where she was noted to be significantly hypoxic. She was placed on high flow nasal cannula but subsequently required BiPAP therapy. Imaging indicates bilateral lower lobe pneumonia, suspicious for aspiration. Pt had MBS 08/25/18 showing moderate oral and mild pharyngeal dysphagia mostly characterized by decreased oral transiting coordination resulting premature spillage of boluses into pharynx; dys 3 with thin liquids and chin tuck was recommended. Progressed to regular textures, thin liquids in CIR and was drinking with head neutral position with no overt s/sx of aspiration.  Type of Study: Bedside Swallow Evaluation Previous Swallow Assessment: see HPI Diet Prior to this Study: NPO Temperature Spikes Noted: No Respiratory Status: Nasal cannula History of Recent Intubation: No Behavior/Cognition: Alert;Cooperative Oral Cavity Assessment: Within Functional Limits Oral Care Completed by SLP: Yes Oral Cavity - Dentition: Adequate natural dentition Vision: Functional for self-feeding Self-Feeding Abilities: Able to feed self Patient Positioning: Upright in bed Baseline Vocal Quality: Normal Volitional Cough: Strong Volitional Swallow: Able to elicit     Oral/Motor/Sensory Function Overall Oral Motor/Sensory Function: Mild impairment Facial ROM: Reduced right;Suspected CN VII (facial) dysfunction Facial Symmetry: Abnormal symmetry right;Suspected CN VII (facial) dysfunction Facial Strength: Reduced right;Suspected CN VII (facial) dysfunction Facial Sensation: Within Functional Limits Lingual ROM: Reduced right;Suspected CN XII (hypoglossal) dysfunction Lingual Symmetry: Abnormal symmetry right;Suspected CN XII (hypoglossal) dysfunction   Ice Chips Ice chips: Within functional limits   Thin Liquid Thin Liquid: Impaired Presentation: Cup;Straw Pharyngeal  Phase Impairments: Suspected delayed Swallow;Multiple swallows    Nectar Thick Nectar Thick Liquid: Within functional limits Presentation: Cup   Honey Thick Honey Thick Liquid: Not tested   Puree Puree: Within functional limits Presentation: Self Fed;Spoon   Solid   GO   Deneise Lever, [place], CCC-SLP Speech-Language Pathologist [phone] Solid: Impaired(mildly prolonged but functional mastication) Presentation: Self Ethelle Lyon 11/18/2017,11:01 AM

## 2017-11-18 NOTE — Progress Notes (Signed)
[place] for Heparin Indication: ACS/STEMI  Allergies  Allergen Reactions  . Lopressor [Metoprolol Tartrate]     Angioedema  . Lipitor [Atorvastatin] Diarrhea   Patient Measurements: Height: 5\' 2"  (157.5 cm) Weight: 143 lb 4.8 oz (65 kg) IBW/kg (Calculated) : 50.1 HEPARIN DW (KG): 56.7  Vital Signs: Temp: 98.8 F (37.1 C) (02/23 1156) Temp Source: Oral (02/23 1156) BP: 103/72 (02/23 1156) Pulse Rate: 94 (02/23 1156)  Labs: Recent Labs    11/16/17 0528 11/16/17 0911 11/16/17 1509 11/16/17 2004 11/17/17 0508 11/18/17 0350 11/18/17 1105  HGB 12.8  --   --   --  11.4*  --   --   HCT 40.8  --   --   --  36.5  --   --   PLT 367  --   --   --  335  --   --   APTT 40*  --   --   --   --   --   --   LABPROT 11.8  --   --   --   --   --   --   INR 0.87  --   --   --   --   --   --   HEPARINUNFRC  --   --   --   --  0.51 <0.10* 0.15*  CREATININE 0.65  --   --   --  0.63 0.64  --   TROPONINI 0.41* 3.02* 10.50* 6.71*  --   --   --    Estimated Creatinine Clearance: 68.7 mL/min (by C-G formula based on SCr of 0.64 mg/dL).  Assessment: 57 yo female continues on IV heparin for NSTEMI. No chest pain currently reported. Troponin peak to 10.5. Eventual catheterization pending medical stabilization.  Heparin level subtherapeutic this AM: 0.15 CBC stable, no bleeding noted  Goal of Therapy:  Heparin level goal 0.3-0.7 unit/ml Monitor platelets by anticoagulation protocol: Yes   Plan:  Heparin bolus 2,000 units x1 Heparin increase to 1100 units / hr Heparin level in 6 hours Planning cath when medically stable - follow-up plans   Diana L. Kyung Rudd, PharmD, [place] PGY1 Pharmacy Resident Pager: [phone]

## 2017-11-18 NOTE — Progress Notes (Signed)
PROGRESS NOTE   JADAH BOBAK  YQM:[id number]    DOB: 1961-02-04    DOA: 11/16/2017  PCP: [name], DO   I have briefly reviewed patients previous medical records in [facility].  Brief Narrative:  57 year old female with PMH of stroke with residual right hemiparesis and 07/2017, completed rehab in December and returned home, presented initially to [facility] with few days history of cough without chest pain and then became suddenly short of breath on night prior to admission.  In ED, significantly hypoxic, required BiPAP, CTA chest negative for PE but showed bibasilar pneumonia suspicious for aspiration.  Course complicated by elevated troponin, echo with low EF, Cardiology evaluated for NSTEMI and recommended transfer to [facility] for eventual cardiac catheterization.  Intermittently requiring BiPAP.  Cardiology following.   Assessment & Plan:   Active Problems:   Right hemiplegia ([place])   Hypokalemia   Former smoker   Acute respiratory failure with hypoxia (HCC)   Aspiration pneumonia (HCC)   Elevated troponin   NSTEMI (non-ST elevated myocardial infarction) ([place])   1. Aspiration pneumonia: Likely from dysphagia due to recent stroke.  CTA chest 2/21: No PE but showed dependent infiltrates in both lower lobes.  Continue empirically started IV Unasyn.  Blood cultures x2: Negative to date.  Improving. 2. Dysphagia: Suspected due to recent acute stroke.  Off of BiPAP/Ventimask this morning.  Speech therapy evaluated and recommend dysphagia 3 diet and nectar thickened liquids. 3. NSTEMI: No chest pain reported.  Troponin peaked to 10.5.  Continue IV heparin drip.  Continue aspirin, statin.  Allergic to beta blockers.  Cardizem discontinued due to low EF.  Cardiology following and plan diagnostic cardiac catheterization early next week pending improvement in clinical status.  No chest pain. 4. Cardiomyopathy: Cardiology following.  Started lisinopril. 5. Acute systolic CHF:  TTE 02/21/40: LVEF 35-40%, diffuse hypokinesis and grade 2 diastolic dysfunction.  IV Lasix increased to 40 mg twice daily.  ACEI started.  Improving.  -1.9 L since admission.  Continue current diuretics.  BNP 1277 on 2/23. 6. Acute respiratory failure with hypoxia: Due to aspiration pneumonia and acute CHF.  No PE by CT.  PRN BiPAP, oxygen.  Was able to wean off BiPAP to Ventimask overnight and then to nasal cannula oxygen this morning.  Monitor closely. 7. Normocytic anemia: No bleeding reported.  Follow CBCs. 8. Hypokalemia: Replace and follow as needed.  Magnesium 1.9. 9. History of stroke with right hemiparesis: Continue aspirin and statins.   DVT prophylaxis: Currently on IV heparin drip. Code Status: Full. Family Communication: None at bedside today. Disposition: To be determined pending clinical improvement and further hospital evaluation.   Consultants:  Cardiology  Procedures:  BiPAP  Antimicrobials:  Unasyn   Subjective: Feels much better.  Dyspnea improved.  Minimal dry cough.  No chest pain.  As per nursing, able to transition to Ventimask overnight.  ROS: As above.  Objective:  Vitals:   11/18/17 0427 11/18/17 0820 11/18/17 0916 11/18/17 1156  BP: (!) 142/61 (!) 144/76  103/72  Pulse: 97 94  94  Resp: 18 (!) 24    Temp: 97.7 F (36.5 C) 97.6 F (36.4 C)  98.8 F (37.1 C)  TempSrc: Axillary Axillary  Oral  SpO2: 99% 100% 98% 96%  Weight: 65 kg (143 lb 4.8 oz)     Height:        Examination:  General exam: Pleasant middle-aged female, moderately built and nourished lying comfortably propped up in bed.  Still had Ventimask on this morning.  Being assessed by RT.  Did not appear in any distress. Respiratory system: Improved breath sounds.  Clear to auscultation anteriorly.  Occasional basal crackles. Respiratory effort normal. Cardiovascular system: S1 & S2 heard, RRR. No JVD, murmurs, rubs, gallops or clicks. No pedal edema.  Telemetry personally reviewed:  Sinus rhythm. Gastrointestinal system: Abdomen is nondistended, soft and nontender. No organomegaly or masses felt. Normal bowel sounds heard.  Stable without change. Central nervous system: Alert and oriented. No focal neurological deficits.  Stable without change. Extremities: Left limbs grade 5 x 5 power.  Right upper extremity get 0 x 5 power.  Right lower extremity grade 4 x 5 power. Skin: No rashes, lesions or ulcers Psychiatry: Judgement and insight appear normal. Mood & affect appropriate.     Data Reviewed: I have personally reviewed following labs and imaging studies  CBC: Recent Labs  Lab 11/16/17 0528 11/17/17 0508  WBC 10.7* 11.8*  NEUTROABS 8.2*  --   HGB 12.8 11.4*  HCT 40.8 36.5  MCV 94.2 93.4  PLT 367 160   Basic Metabolic Panel: Recent Labs  Lab 11/16/17 0528 11/16/17 0911 11/17/17 0508 11/18/17 0350  NA 139  --  142 141  K 3.2*  --  4.0 3.2*  CL 106  --  108 101  CO2 23  --  24 28  GLUCOSE 142*  --  113* 81  BUN 22*  --  20 20  CREATININE 0.65  --  0.63 0.64  CALCIUM 8.9  --  9.0 9.0  MG  --  1.9  --   --    Liver Function Tests: Recent Labs  Lab 11/17/17 0508  AST 64*  ALT 26  ALKPHOS 63  BILITOT 0.5  PROT 6.2*  ALBUMIN 3.3*   Coagulation Profile: Recent Labs  Lab 11/16/17 0528  INR 0.87   Cardiac Enzymes: Recent Labs  Lab 11/16/17 0528 11/16/17 0911 11/16/17 1509 11/16/17 2004  TROPONINI 0.41* 3.02* 10.50* 6.71*     Recent Results (from the past 240 hour(s))  Blood culture (routine x 2)     Status: None (Preliminary result)   Collection Time: 11/16/17  5:32 AM  Result Value Ref Range Status   Specimen Description BLOOD LEFT ARM  Final   Special Requests   Final    BOTTLES DRAWN AEROBIC AND ANAEROBIC Blood Culture adequate volume   Culture   Final    NO GROWTH 2 DAYS Performed at [facility], [address], [place], [place] [address]    Report Status PENDING  Incomplete  Blood culture (routine x 2)     Status: None  (Preliminary result)   Collection Time: 11/16/17  5:37 AM  Result Value Ref Range Status   Specimen Description BLOOD LEFT ARM  Final   Special Requests   Final    Blood Culture results may not be optimal due to an inadequate volume of blood received in culture bottles BOTTLES DRAWN AEROBIC ONLY   Culture   Final    NO GROWTH 2 DAYS Performed at [facility], [address], [place], [place] [address]    Report Status PENDING  Incomplete         Radiology Studies: Dg Chest Port 1 View  Result Date: 11/17/2017 CLINICAL DATA:  Shortness of Breath EXAM: PORTABLE CHEST 1 VIEW COMPARISON:  11/16/2017 FINDINGS: Cardiomegaly with vascular congestion and diffuse interstitial prominence, likely interstitial edema. Continued right basilar focal airspace opacity which could reflect pneumonia.  No visible effusions or acute bony abnormality. IMPRESSION: Cardiomegaly.  Suspect mild interstitial edema. Stable focal right basilar infiltrate concerning for pneumonia. Electronically Signed   By: Rolm Baptise M.D.   On: 11/17/2017 07:17        Scheduled Meds: . aspirin  300 mg Rectal Daily  . baclofen  5 mg Oral TID  . escitalopram  10 mg Oral Daily  . famotidine  20 mg Oral BID  . furosemide  40 mg Intravenous BID  . guaiFENesin  1,200 mg Oral BID  . hydrOXYzine  25 mg Oral QHS  . ipratropium-albuterol  3 mL Nebulization BID  . lisinopril  5 mg Oral Daily  . potassium chloride  40 mEq Oral BID  . rosuvastatin  20 mg Oral Daily  . sodium chloride flush  3 mL Intravenous Q12H   Continuous Infusions: . sodium chloride    . ampicillin-sulbactam (UNASYN) IV Stopped (11/18/17 0940)  . heparin 1,100 Units/hr (11/18/17 1412)     LOS: 2 days     Vernell Leep, MD, FACP, [facility]. [facility] Pager [phone]  If 7PM-7AM, please contact night-coverage [email or web address] Password [facility] 11/18/2017, 2:34 PM

## 2017-11-18 NOTE — Progress Notes (Signed)
[name] for Heparin Indication: ACS/STEMI  Allergies  Allergen Reactions  . Lopressor [Metoprolol Tartrate]     Angioedema  . Lipitor [Atorvastatin] Diarrhea   Patient Measurements: Height: 5\' 2"  (157.5 cm) Weight: 143 lb 4.8 oz (65 kg) IBW/kg (Calculated) : 50.1 HEPARIN DW (KG): 56.7  Vital Signs: Temp: 97.9 F (36.6 C) (02/23 1937) Temp Source: Oral (02/23 1937) BP: 125/70 (02/23 1937) Pulse Rate: 87 (02/23 2000)  Labs: Recent Labs    11/16/17 0528 11/16/17 0911 11/16/17 1509 11/16/17 2004  11/17/17 0508 11/18/17 0350 11/18/17 1105 11/18/17 1958  HGB 12.8  --   --   --   --  11.4*  --   --   --   HCT 40.8  --   --   --   --  36.5  --   --   --   PLT 367  --   --   --   --  335  --   --   --   APTT 40*  --   --   --   --   --   --   --   --   LABPROT 11.8  --   --   --   --   --   --   --   --   INR 0.87  --   --   --   --   --   --   --   --   HEPARINUNFRC  --   --   --   --    < > 0.51 <0.10* 0.15* 0.40  CREATININE 0.65  --   --   --   --  0.63 0.64  --   --   TROPONINI 0.41* 3.02* 10.50* 6.71*  --   --   --   --   --    < > = values in this interval not displayed.   Estimated Creatinine Clearance: 68.7 mL/min (by C-G formula based on SCr of 0.64 mg/dL).  Assessment: 57 yo female continues on IV heparin for NSTEMI. No chest pain currently reported. Troponin peak to 10.5. Catheterization pending medical stabilization. -heparin level at goal after increase to 1100units/hr   Goal of Therapy:  Heparin level goal 0.3-0.7 unit/ml Monitor platelets by anticoagulation protocol: Yes   Plan:  4 -No heparin changes needed -Recheck labs in am  Hildred Laser, Pharm D 11/18/2017 9:01 PM   e

## 2017-11-19 ENCOUNTER — Inpatient Hospital Stay (HOSPITAL_COMMUNITY): Payer: BLUE CROSS/BLUE SHIELD

## 2017-11-19 DIAGNOSIS — I255 Ischemic cardiomyopathy: Secondary | ICD-10-CM

## 2017-11-19 LAB — COMPREHENSIVE METABOLIC PANEL
ALBUMIN: 3.4 g/dL — AB (ref 3.5–5.0)
ALT: 19 U/L (ref 14–54)
ANION GAP: 11 (ref 5–15)
AST: 31 U/L (ref 15–41)
Alkaline Phosphatase: 65 U/L (ref 38–126)
BILIRUBIN TOTAL: 0.8 mg/dL (ref 0.3–1.2)
BUN: 24 mg/dL — ABNORMAL HIGH (ref 6–20)
CO2: 27 mmol/L (ref 22–32)
Calcium: 9.3 mg/dL (ref 8.9–10.3)
Chloride: 101 mmol/L (ref 101–111)
Creatinine, Ser: 0.73 mg/dL (ref 0.44–1.00)
GLUCOSE: 80 mg/dL (ref 65–99)
POTASSIUM: 4.1 mmol/L (ref 3.5–5.1)
Sodium: 139 mmol/L (ref 135–145)
TOTAL PROTEIN: 7.2 g/dL (ref 6.5–8.1)

## 2017-11-19 LAB — HEPARIN LEVEL (UNFRACTIONATED)
HEPARIN UNFRACTIONATED: 0.29 [IU]/mL — AB (ref 0.30–0.70)
HEPARIN UNFRACTIONATED: 0.54 [IU]/mL (ref 0.30–0.70)

## 2017-11-19 LAB — CBC
HEMATOCRIT: 43.6 % (ref 36.0–46.0)
Hemoglobin: 13.6 g/dL (ref 12.0–15.0)
MCH: 29.4 pg (ref 26.0–34.0)
MCHC: 31.2 g/dL (ref 30.0–36.0)
MCV: 94.2 fL (ref 78.0–100.0)
Platelets: 373 10*3/uL (ref 150–400)
RBC: 4.63 MIL/uL (ref 3.87–5.11)
RDW: 14.1 % (ref 11.5–15.5)
WBC: 9.2 10*3/uL (ref 4.0–10.5)

## 2017-11-19 MED ORDER — SODIUM CHLORIDE 0.9 % IV SOLN
INTRAVENOUS | Status: DC
  Administered 2017-11-20: 06:00:00 via INTRAVENOUS

## 2017-11-19 MED ORDER — ASPIRIN 81 MG PO CHEW
81.0000 mg | CHEWABLE_TABLET | ORAL | Status: AC
  Administered 2017-11-20: 81 mg via ORAL
  Filled 2017-11-19: qty 1

## 2017-11-19 MED ORDER — SODIUM CHLORIDE 0.9 % IV SOLN: 250.0000 mL | INTRAVENOUS | Status: DC | PRN

## 2017-11-19 MED ORDER — SODIUM CHLORIDE 0.9% FLUSH: 3.0000 mL | Freq: Two times a day (BID) | INTRAVENOUS | Status: DC

## 2017-11-19 MED ORDER — LOPERAMIDE HCL 2 MG PO CAPS
2.0000 mg | ORAL_CAPSULE | Freq: Two times a day (BID) | ORAL | Status: DC | PRN
  Administered 2017-11-19: 2 mg via ORAL
  Filled 2017-11-19: qty 1

## 2017-11-19 MED ORDER — SODIUM CHLORIDE 0.9% FLUSH: 3.0000 mL | INTRAVENOUS | Status: DC | PRN

## 2017-11-19 NOTE — Progress Notes (Signed)
Modified Barium Swallow Progress Note  Patient Details  Name: [name] MRN: [id number] Date of Birth: [birth year]  Today's Date: 11/19/2017  Modified Barium Swallow completed.  Full report located under Chart Review in the Imaging Section.  Brief recommendations include the following:  Clinical Impression  Patient presents with mild oropharyngeal dysphagia; no aspiration observed on this examination. Oral stage notable for premature spillage of larger boluses of thin liquids, as well as delayed oral transit with mixed consistency (barium tablet and thin liquid). Pt took a large sip of liquid with barium tablet and tilted into neck extension in an apparent effort to aid transit. Swallow initiation was delayed with thin liquids to the pyriform sinuses. Trace, reversible penetration resulted due to delayed closure of the laryngeal vestibule. Pharyngeal stage characterized by adequate tongue base retraction, hyolargyneal excursion and pharyngeal constriction. An esophageal sweep revealed slow clearance of contrast and barium tablet through the distal esophagus, tablet was not observed to pass through GE junction despite subsequent swallows of puree, thin liquids. Recommend regular diet with thin liquids, chin tuck is not needed for airway protection. Single, smaller sips do improve oral control and minimize risk for airway compromise. Pt told SLP she typically takes "4 or 5" pills at a time and "throws" her head back. SLP educated re: taking pills one a time with small sips of liquid or whole in puree to aid transit, follow with sips of water to aid clearance; consider breaking larger pills into smaller pieces. Will follow for tolerance and education, briefly.   Swallow Evaluation Recommendations       SLP Diet Recommendations: Regular solids;Thin liquid   Liquid Administration via: Cup;Straw   Medication Administration: Whole meds with liquid(one at at time, drink plenty of water  afterwards)   Supervision: Patient able to self feed   Compensations: Slow rate;Small sips/bites;Minimize environmental distractions             Deneise Lever, [place], [place] Speech-Language Pathologist [phone]   Aliene Altes 11/19/2017,11:41 AM

## 2017-11-19 NOTE — Progress Notes (Signed)
ANTICOAGULATION CONSULT NOTE - [facility] for Heparin Indication: ACS/STEMI  Allergies  Allergen Reactions  . Lopressor [Metoprolol Tartrate]     Angioedema  . Lipitor [Atorvastatin] Diarrhea   Patient Measurements: Height: 5\' 2"  (157.5 cm) Weight: 136 lb 11 oz (62 kg) IBW/kg (Calculated) : 50.1 HEPARIN DW (KG): 56.7  Vital Signs: Temp: 97.5 F (36.4 C) (02/24 1223) Temp Source: Oral (02/24 1223) BP: 132/87 (02/24 1223) Pulse Rate: 102 (02/24 1223)  Labs: Recent Labs    11/16/17 2004  11/17/17 4650 11/18/17 0350  11/18/17 1958 11/19/17 0427 11/19/17 1638  HGB  --   --  11.4*  --   --   --  13.6  --   HCT  --   --  36.5  --   --   --  43.6  --   PLT  --   --  335  --   --   --  373  --   HEPARINUNFRC  --    < > 0.51 <0.10*   < > 0.40 0.29* 0.54  CREATININE  --   --  0.63 0.64  --   --  0.73  --   TROPONINI 6.71*  --   --   --   --   --   --   --    < > = values in this interval not displayed.   Estimated Creatinine Clearance: 67.2 mL/min (by C-G formula based on SCr of 0.73 mg/dL).  Assessment: 57 yo female continues on IV heparin for NSTEMI. No chest pain currently reported. Troponin peak to 10.5. Eventual catheterization pending medical stabilization.  Heparin level now therapeutic at 0.54 on 1250 units/hr   Goal of Therapy:  Heparin level goal 0.3-0.7 unit/ml Monitor platelets by anticoagulation protocol: Yes   Plan:  Continue heparin at 1250 units / hr Confirmatory heparin level in 6 hours Planning cath when medically stable - follow-up plans  Albertina Parr, PharmD., BCPS Clinical Pharmacist Pager [phone]

## 2017-11-19 NOTE — Progress Notes (Signed)
Progress Note  Patient Name: [name] Date of Encounter: 11/19/2017  Consulting Cardiologist: Dr. Satira Sark  Subjective   No active chest pain, breathing comfortably on nasal cannula O2.  No palpitations.  Inpatient Medications    Scheduled Meds: . aspirin EC  325 mg Oral Daily  . baclofen  5 mg Oral TID  . escitalopram  10 mg Oral Daily  . famotidine  20 mg Oral BID  . furosemide  40 mg Intravenous BID  . guaiFENesin  1,200 mg Oral BID  . hydrOXYzine  25 mg Oral QHS  . ipratropium-albuterol  3 mL Nebulization BID  . lisinopril  5 mg Oral Daily  . potassium chloride  40 mEq Oral BID  . rosuvastatin  20 mg Oral Daily  . sodium chloride flush  3 mL Intravenous Q12H   Continuous Infusions: . sodium chloride    . ampicillin-sulbactam (UNASYN) IV Stopped (11/19/17 0950)  . heparin 1,250 Units/hr (11/19/17 1120)   PRN Meds: sodium chloride, albuterol, food thickener, pneumococcal 23 valent vaccine, RESOURCE THICKENUP CLEAR, sodium chloride flush   Vital Signs    Vitals:   11/19/17 0845 11/19/17 0920 11/19/17 0934 11/19/17 1223  BP:  134/66  132/87  Pulse: 90   (!) 102  Resp: 12   17  Temp:    (!) 97.5 F (36.4 C)  TempSrc:    Oral  SpO2: 98%  94% 94%  Weight:      Height:        Intake/Output Summary (Last 24 hours) at 11/19/2017 1236 Last data filed at 11/19/2017 1120 Gross per 24 hour  Intake 952.63 ml  Output 2650 ml  Net -1697.37 ml   Filed Weights   11/17/17 0531 11/18/17 0427 11/19/17 0415  Weight: 136 lb 11 oz (62 kg) 143 lb 4.8 oz (65 kg) 136 lb 11 oz (62 kg)    Telemetry    Sinus rhythm.  Personally reviewed.  Physical Exam   GEN:  Chronically ill-appearing woman.  No acute distress.   Neck: No JVD. Cardiac: RRR, no gallop.  Respiratory: Nonlabored.  Scattered rhonchi. GI: Soft, nontender, bowel sounds present. MS: No edema; No deformity. Neuro:  Right leg weakness and right arm paresis.[name] Psych: Alert and oriented x 3.  Normal affect.  Labs    Chemistry Recent Labs  Lab 11/17/17 0508 11/18/17 0350 11/19/17 0427  NA 142 141 139  K 4.0 3.2* 4.1  CL 108 101 101  CO2 24 28 27   GLUCOSE 113* 81 80  BUN 20 20 24*  CREATININE 0.63 0.64 0.73  CALCIUM 9.0 9.0 9.3  PROT 6.2*  --  7.2  ALBUMIN 3.3*  --  3.4*  AST 64*  --  31  ALT 26  --  19  ALKPHOS 63  --  65  BILITOT 0.5  --  0.8  GFRNONAA >60 >60 >60  GFRAA >60 >60 >60  ANIONGAP 10 12 11      Hematology Recent Labs  Lab 11/16/17 0528 11/17/17 0508 11/19/17 0427  WBC 10.7* 11.8* 9.2  RBC 4.33 3.91 4.63  HGB 12.8 11.4* 13.6  HCT 40.8 36.5 43.6  MCV 94.2 93.4 94.2  MCH 29.6 29.2 29.4  MCHC 31.4 31.2 31.2  RDW 13.9 14.3 14.1  PLT 367 335 373    Cardiac Enzymes Recent Labs  Lab 11/16/17 0528 11/16/17 0911 11/16/17 1509 11/16/17 2004  TROPONINI 0.41* 3.02* 10.50* 6.71*   No results for input(s): TROPIPOC in the last 168  hours.   BNP Recent Labs  Lab 11/16/17 0528 11/18/17 0350  BNP 675.0* 1,276.7*     DDimer  Recent Labs  Lab 11/16/17 0528  DDIMER 1.08*     Radiology    Dg Swallowing Func-speech Pathology  Result Date: 11/19/2017 Objective Swallowing Evaluation: Type of Study: MBS-Modified Barium Swallow Study  Patient Details Name: [name] MRN: [id number] Date of Birth: 1961/04/18 Today's Date: 11/19/2017 Time: SLP Start Time (ACUTE ONLY): 1050 -SLP Stop Time (ACUTE ONLY): 1117 SLP Time Calculation (min) (ACUTE ONLY): 27 min Past Medical History: Past Medical History: Diagnosis Date . Arthritis   "knees" (11/16/2017) . Hay fever  . Pneumonia 11/16/2017 . Stroke [facility]) 08/23/2017  Left MCA infarct status post TPA and thrombectomy Past Surgical History: Past Surgical History: Procedure Laterality Date . CRYOABLATION    cervical . IR ANGIO INTRA EXTRACRAN SEL COM CAROTID INNOMINATE UNI R MOD SED  08/21/2017 . IR ANGIO VERTEBRAL SEL SUBCLAVIAN INNOMINATE UNI R MOD SED  08/21/2017 . IR PERCUTANEOUS ART THROMBECTOMY/INFUSION  INTRACRANIAL INC DIAG ANGIO  08/21/2017 . IR RADIOLOGIST EVAL & MGMT  10/30/2017 . LOOP RECORDER INSERTION N/A 08/28/2017  Procedure: LOOP RECORDER INSERTION;  Surgeon: Constance Haw, MD;  Location: [facility];  Service: Cardiovascular;  Laterality: N/A; . RADIOLOGY WITH ANESTHESIA N/A 08/21/2017  Procedure: RADIOLOGY WITH ANESTHESIA;  Surgeon: Luanne Bras, MD;  Location: [facility];  Service: Radiology;  Laterality: N/A; . TEE WITHOUT CARDIOVERSION N/A 08/28/2017  Procedure: TRANSESOPHAGEAL ECHOCARDIOGRAM (TEE);  Surgeon: Fay Records, MD;  Location: [facility] ENDOSCOPY;  Service: Cardiovascular;  Laterality: N/A; . TUBAL LIGATION   HPI: [name] a 87 y.o.femalewith medical history significant ofsignificant stroke in 07/2017. She was in inpatient rehab until December 20, after which she returned home. She presented to ED with SOB where she was noted to be significantly hypoxic. She was placed on high flow nasal cannula but subsequently required BiPAP therapy. Imaging indicates bilateral lower lobe pneumonia, suspicious for aspiration. Pt had MBS 08/25/18 showing moderate oral and mild pharyngeal dysphagia mostly characterized by decreased oral transiting coordination resulting premature spillage of boluses into pharynx; dys 3 with thin liquids and chin tuck was recommended. Progressed to regular textures, thin liquids in CIR and was drinking with head neutral position with no overt s/sx of aspiration.  Subjective: Pt reports she is thirsty Assessment / Plan / Recommendation CHL IP CLINICAL IMPRESSIONS 11/19/2017 Clinical Impression Patient presents with mild oropharyngeal dysphagia; no aspiration observed on this examination. Oral stage notable for premature spillage of larger boluses of thin liquids, as well as delayed oral transit with mixed consistency (barium tablet and thin liquid). Pt took a large sip of liquid with barium tablet and tilted into neck extension in an apparent effort to  aid transit. Swallow initiation was delayed with thin liquids to the pyriform sinuses. Trace, reversible penetration resulted due to delayed closure of the laryngeal vestibule. Pharyngeal stage characterized by adequate tongue base retraction, hyolargyneal excursion and pharyngeal constriction. An esophageal sweep revealed slow clearance of contrast and barium tablet through the distal esophagus, tablet was not observed to pass through GE junction despite subsequent swallows of puree, thin liquids. Recommend regular diet with thin liquids, chin tuck is not needed for airway protection. Single, smaller sips do improve oral control and minimize risk for airway compromise. Pt told SLP she typically takes "4 or 5" pills at a time and "throws" her head back. SLP educated re: taking pills one a time with small sips  of liquid or whole in puree to aid transit, follow with sips of water to aid clearance; consider breaking larger pills into smaller pieces. Will follow for tolerance and education, briefly. SLP Visit Diagnosis Dysphagia, oropharyngeal phase (R13.12) Attention and concentration deficit following -- Frontal lobe and executive function deficit following -- Impact on safety and function Mild aspiration risk   CHL IP TREATMENT RECOMMENDATION 11/19/2017 Treatment Recommendations Therapy as outlined in treatment plan below   Prognosis 11/19/2017 Prognosis for Safe Diet Advancement Good Barriers to Reach Goals Cognitive deficits Barriers/Prognosis Comment -- CHL IP DIET RECOMMENDATION 11/19/2017 SLP Diet Recommendations Regular solids;Thin liquid Liquid Administration via Cup;Straw Medication Administration Whole meds with liquid Compensations Slow rate;Small sips/bites;Minimize environmental distractions Postural Changes --   CHL IP OTHER RECOMMENDATIONS 11/18/2017 Recommended Consults -- Oral Care Recommendations -- Other Recommendations Order thickener from pharmacy;Prohibited food (jello, ice cream, thin soups);Remove  water pitcher   CHL IP FOLLOW UP RECOMMENDATIONS 11/19/2017 Follow up Recommendations Other (comment)   CHL IP FREQUENCY AND DURATION 11/19/2017 Speech Therapy Frequency (ACUTE ONLY) min 1 x/week Treatment Duration 1 week      CHL IP ORAL PHASE 11/19/2017 Oral Phase Impaired Oral - Pudding Teaspoon -- Oral - Pudding Cup -- Oral - Honey Teaspoon -- Oral - Honey Cup -- Oral - Nectar Teaspoon -- Oral - Nectar Cup -- Oral - Nectar Straw -- Oral - Thin Teaspoon -- Oral - Thin Cup Premature spillage Oral - Thin Straw Premature spillage Oral - Puree -- Oral - Mech Soft -- Oral - Regular -- Oral - Multi-Consistency Premature spillage;Delayed oral transit Oral - Pill -- Oral Phase - Comment --  CHL IP PHARYNGEAL PHASE 11/19/2017 Pharyngeal Phase Impaired Pharyngeal- Pudding Teaspoon -- Pharyngeal -- Pharyngeal- Pudding Cup -- Pharyngeal -- Pharyngeal- Honey Teaspoon -- Pharyngeal -- Pharyngeal- Honey Cup -- Pharyngeal -- Pharyngeal- Nectar Teaspoon -- Pharyngeal -- Pharyngeal- Nectar Cup -- Pharyngeal -- Pharyngeal- Nectar Straw -- Pharyngeal -- Pharyngeal- Thin Teaspoon -- Pharyngeal -- Pharyngeal- Thin Cup Delayed swallow initiation-pyriform sinuses;Penetration/Aspiration before swallow Pharyngeal Material enters airway, remains ABOVE vocal cords then ejected out;Material does not enter airway Pharyngeal- Thin Straw Delayed swallow initiation-pyriform sinuses;Penetration/Aspiration before swallow Pharyngeal Material does not enter airway;Material enters airway, remains ABOVE vocal cords then ejected out Pharyngeal- Puree WFL Pharyngeal -- Pharyngeal- Mechanical Soft -- Pharyngeal -- Pharyngeal- Regular WFL Pharyngeal -- Pharyngeal- Multi-consistency -- Pharyngeal -- Pharyngeal- Pill -- Pharyngeal -- Pharyngeal Comment --  CHL IP CERVICAL ESOPHAGEAL PHASE 11/19/2017 Cervical Esophageal Phase WFL Pudding Teaspoon -- Pudding Cup -- Honey Teaspoon -- Honey Cup -- Nectar Teaspoon -- Nectar Cup -- Nectar Straw -- Thin Teaspoon --  Thin Cup -- Thin Straw -- Puree -- Mechanical Soft -- Regular -- Multi-consistency -- Pill -- Cervical Esophageal Comment -- No flowsheet data found. Aliene Altes 11/19/2017, 11:42 AM Deneise Lever, MS, CCC-SLP Speech-Language Pathologist [phone]              Cardiac Studies   Echocardiogram 11/16/2017: Study Conclusions  - Left ventricle: The cavity size was mildly dilated. Wall thickness was increased in a pattern of mild LVH. Systolic function was moderately reduced. The estimated ejection fraction was in the range of 35% to 40%. Diffuse hypokinesis. There is akinesis of the basal-midinferolateral, inferior, and inferoseptal myocardium. Features are consistent with a pseudonormal left ventricular filling pattern, with concomitant abnormal relaxation and increased filling pressure (grade 2 diastolic dysfunction). - Mitral valve: Mildly thickened leaflets . There was mild to moderate regurgitation. - Left atrium: The atrium  was mildly dilated. - Right atrium: Central venous pressure (est): 8 mm Hg. - Atrial septum: No defect or patent foramen ovale was identified. - Tricuspid valve: There was trivial regurgitation. - Pulmonary arteries: Systolic pressure could not be accurately estimated. - Pericardium, extracardiac: There was no pericardial effusion.  Patient Profile     57 y.o. 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  Assessment & Plan    1.  NSTEMI with peak troponin I of 10.5 and trending down.  No active chest pain on medical therapy at this time.  2.  Suspected ischemic cardiomyopathy with LVEF 35-40% and wall motion abnormalities.  3.  History of left MCA stroke in November 2018 treated with TPA and thrombectomy.  She has residual right-sided weakness that has improved since  that time to some degree.  She has a loop recorder in place that has not documented any atrial fibrillation.  4.  Possible aspiration pneumonia, management per primary team on broad-spectrum antibiotics.  No longer requiring BiPAP.  She is afebrile.  Plan to schedule right and left heart catheterization for tomorrow for further assessment.  Continue aspirin, lisinopril, and Crestor.  She has a beta-blocker allergy.  Plan to stop Lasix after second dose today.  Signed, [name], MD  11/19/2017, 12:36 PM

## 2017-11-19 NOTE — Progress Notes (Signed)
PROGRESS NOTE   [name]  XNA:[id number]    DOB: 11-29-1960    DOA: 11/16/2017  PCP: [name], DO   I have briefly reviewed patients previous medical records in [facility].  Brief Narrative:  57 year old female with PMH of stroke with residual right hemiparesis and 07/2017, completed rehab in December and returned home, presented initially to [facility] with few days history of cough without chest pain and then became suddenly short of breath on night prior to admission.  In ED, significantly hypoxic, required BiPAP, CTA chest negative for PE but showed bibasilar pneumonia suspicious for aspiration.  Course complicated by elevated troponin, echo with low EF, Cardiology evaluated for NSTEMI and recommended transfer to [facility] for eventual cardiac catheterization.  Intermittently requiring BiPAP.  Cardiology following.  Improving.  Diet advanced to regular and thin liquids.  Cardiology plans right and left heart cath on 2/25.   Assessment & Plan:   Active Problems:   Right hemiplegia ([place])   Hypokalemia   Former smoker   Acute respiratory failure with hypoxia (HCC)   Aspiration pneumonia (HCC)   Elevated troponin   NSTEMI (non-ST elevated myocardial infarction) ([place])   1. Aspiration pneumonia: Likely from dysphagia due to recent stroke.  CTA chest 2/21: No PE but showed dependent infiltrates in both lower lobes.  Continue empirically started IV Unasyn.  Blood cultures x2: Negative to date.  Improving.  Consider transitioning to oral Augmentin 2/25. 2. Dysphagia: Suspected due to recent acute stroke.  Off of BiPAP/Ventimask this morning.  Speech therapy has advance diet from dysphagia 3 diet and nectar thickened liquids on 2/23 to regular diet and thin liquids on 2/24. 3. NSTEMI: No chest pain reported.  Troponin peaked to 10.5.  Continue IV heparin drip.  Continue aspirin, statin.  Allergic to beta blockers.  Cardizem discontinued due to low EF.  Cardiology follow-up  appreciated and plan for left and right heart cath 2/25. 4. Cardiomyopathy: Cardiology following.  Started lisinopril. 5. Acute systolic CHF: TTE 5/42/70: LVEF 35-40%, diffuse hypokinesis and grade 2 diastolic dysfunction.  IV Lasix increased to 40 mg twice daily.  ACEI started.  Improving.  -3.4 L since admission.  Continue current diuretics and stop after today's doses.  BNP 1277 on 2/23.  Improved. 6. Acute respiratory failure with hypoxia: Due to aspiration pneumonia and acute CHF.  No PE by CT.  PRN BiPAP, oxygen.  Able to wean down to 3 L [place] oxygen.  Continue to wean to room air as tolerated for saturation >92%. 7. Normocytic anemia: Likely lab error.  Hemoglobin 13.6 on 2/24. 8. Hypokalemia: Replaced.  Magnesium 1.9.  Discontinue potassium supplements once diuretics are stopped. 9. History of stroke with right hemiparesis: Continue aspirin and statins.   DVT prophylaxis: Currently on IV heparin drip. Code Status: Full. Family Communication: None at bedside today. Disposition: To be determined pending clinical improvement and further hospital evaluation.   Consultants:  Cardiology  Procedures:  BiPAP  Antimicrobials:  Unasyn   Subjective: Continues to feel better.  Dyspnea resolved.  Mild nonproductive cough/chest feels slightly congested.  No chest pain.  ROS: As above.  Objective:  Vitals:   11/19/17 0845 11/19/17 0920 11/19/17 0934 11/19/17 1223  BP:  134/66  132/87  Pulse: 90   (!) 102  Resp: 12   17  Temp:    (!) 97.5 F (36.4 C)  TempSrc:    Oral  SpO2: 98%  94% 94%  Weight:  Height:        Examination:  General exam: Pleasant middle-aged female, moderately built and nourished lying comfortably propped up in bed.  Does not appear in any distress Respiratory system: Occasional basal crackles but otherwise clear to auscultation.  No increased work of breathing. Cardiovascular system: S1 & S2 heard, RRR. No JVD, murmurs, rubs, gallops or clicks. No pedal  edema.  Telemetry personally reviewed: Sinus rhythm Gastrointestinal system: Abdomen is nondistended, soft and nontender. No organomegaly or masses felt. Normal bowel sounds heard.  Stable without change. Central nervous system: Alert and oriented. No focal neurological deficits.  Stable without change. Extremities: Left limbs grade 5 x 5 power.  Right upper extremity get 0 x 5 power.  Right lower extremity grade 4 x 5 power. Skin: No rashes, lesions or ulcers Psychiatry: Judgement and insight appear normal. Mood & affect appropriate.     Data Reviewed: I have personally reviewed following labs and imaging studies  CBC: Recent Labs  Lab 11/16/17 0528 11/17/17 0508 11/19/17 0427  WBC 10.7* 11.8* 9.2  NEUTROABS 8.2*  --   --   HGB 12.8 11.4* 13.6  HCT 40.8 36.5 43.6  MCV 94.2 93.4 94.2  PLT 367 335 017   Basic Metabolic Panel: Recent Labs  Lab 11/16/17 0528 11/16/17 0911 11/17/17 0508 11/18/17 0350 11/19/17 0427  NA 139  --  142 141 139  K 3.2*  --  4.0 3.2* 4.1  CL 106  --  108 101 101  CO2 23  --  24 28 27   GLUCOSE 142*  --  113* 81 80  BUN 22*  --  20 20 24*  CREATININE 0.65  --  0.63 0.64 0.73  CALCIUM 8.9  --  9.0 9.0 9.3  MG  --  1.9  --   --   --    Liver Function Tests: Recent Labs  Lab 11/17/17 0508 11/19/17 0427  AST 64* 31  ALT 26 19  ALKPHOS 63 65  BILITOT 0.5 0.8  PROT 6.2* 7.2  ALBUMIN 3.3* 3.4*   Coagulation Profile: Recent Labs  Lab 11/16/17 0528  INR 0.87   Cardiac Enzymes: Recent Labs  Lab 11/16/17 0528 11/16/17 0911 11/16/17 1509 11/16/17 2004  TROPONINI 0.41* 3.02* 10.50* 6.71*     Recent Results (from the past 240 hour(s))  Blood culture (routine x 2)     Status: None (Preliminary result)   Collection Time: 11/16/17  5:32 AM  Result Value Ref Range Status   Specimen Description BLOOD LEFT ARM  Final   Special Requests   Final    BOTTLES DRAWN AEROBIC AND ANAEROBIC Blood Culture adequate volume   Culture   Final    NO  GROWTH 3 DAYS Performed at [facility], [address] [place], [place] [address]    Report Status PENDING  Incomplete  Blood culture (routine x 2)     Status: None (Preliminary result)   Collection Time: 11/16/17  5:37 AM  Result Value Ref Range Status   Specimen Description BLOOD LEFT ARM  Final   Special Requests   Final    Blood Culture results may not be optimal due to an inadequate volume of blood received in culture bottles BOTTLES DRAWN AEROBIC ONLY   Culture   Final    NO GROWTH 3 DAYS Performed at [facility], [address], [place], [place] [address]    Report Status PENDING  Incomplete         Radiology Studies: Dg  Swallowing Func-speech Pathology  Result Date: 11/19/2017 Objective Swallowing Evaluation: Type of Study: MBS-Modified Barium Swallow Study  Patient Details Name: [name] MRN: [id number] Date of Birth: [birth year] Today's Date: 11/19/2017 Time: SLP Start Time (ACUTE ONLY): 1050 -SLP Stop Time (ACUTE ONLY): 1117 SLP Time Calculation (min) (ACUTE ONLY): 27 min Past Medical History: Past Medical History: Diagnosis Date . Arthritis   "knees" (11/16/2017) . Hay fever  . Pneumonia 11/16/2017 . Stroke [facility]) 08/23/2017  Left MCA infarct status post TPA and thrombectomy Past Surgical History: Past Surgical History: Procedure Laterality Date . CRYOABLATION    cervical . IR ANGIO INTRA EXTRACRAN SEL COM CAROTID INNOMINATE UNI R MOD SED  08/21/2017 . IR ANGIO VERTEBRAL SEL SUBCLAVIAN INNOMINATE UNI R MOD SED  08/21/2017 . IR PERCUTANEOUS ART THROMBECTOMY/INFUSION INTRACRANIAL INC DIAG ANGIO  08/21/2017 . IR RADIOLOGIST EVAL & MGMT  10/30/2017 . LOOP RECORDER INSERTION N/A 08/28/2017  Procedure: LOOP RECORDER INSERTION;  Surgeon: Constance Haw, MD;  Location: [place] CV LAB;  Service: Cardiovascular;  Laterality: N/A; . RADIOLOGY WITH ANESTHESIA N/A 08/21/2017  Procedure: RADIOLOGY WITH ANESTHESIA;  Surgeon: Luanne Bras, MD;  Location: [place];  Service:  Radiology;  Laterality: N/A; . TEE WITHOUT CARDIOVERSION N/A 08/28/2017  Procedure: TRANSESOPHAGEAL ECHOCARDIOGRAM (TEE);  Surgeon: Fay Records, MD;  Location: [facility] ENDOSCOPY;  Service: Cardiovascular;  Laterality: N/A; . TUBAL LIGATION   HPI: [name] a 74 y.o.femalewith medical history significant ofsignificant stroke in 07/2017. She was in inpatient rehab until December 20, after which she returned home. She presented to ED with SOB where she was noted to be significantly hypoxic. She was placed on high flow nasal cannula but subsequently required BiPAP therapy. Imaging indicates bilateral lower lobe pneumonia, suspicious for aspiration. Pt had MBS 08/25/18 showing moderate oral and mild pharyngeal dysphagia mostly characterized by decreased oral transiting coordination resulting premature spillage of boluses into pharynx; dys 3 with thin liquids and chin tuck was recommended. Progressed to regular textures, thin liquids in CIR and was drinking with head neutral position with no overt s/sx of aspiration.  Subjective: Pt reports she is thirsty Assessment / Plan / Recommendation CHL IP CLINICAL IMPRESSIONS 11/19/2017 Clinical Impression Patient presents with mild oropharyngeal dysphagia; no aspiration observed on this examination. Oral stage notable for premature spillage of larger boluses of thin liquids, as well as delayed oral transit with mixed consistency (barium tablet and thin liquid). Pt took a large sip of liquid with barium tablet and tilted into neck extension in an apparent effort to aid transit. Swallow initiation was delayed with thin liquids to the pyriform sinuses. Trace, reversible penetration resulted due to delayed closure of the laryngeal vestibule. Pharyngeal stage characterized by adequate tongue base retraction, hyolargyneal excursion and pharyngeal constriction. An esophageal sweep revealed slow clearance of contrast and barium tablet through the distal esophagus, tablet was  not observed to pass through GE junction despite subsequent swallows of puree, thin liquids. Recommend regular diet with thin liquids, chin tuck is not needed for airway protection. Single, smaller sips do improve oral control and minimize risk for airway compromise. Pt told SLP she typically takes "4 or 5" pills at a time and "throws" her head back. SLP educated re: taking pills one a time with small sips of liquid or whole in puree to aid transit, follow with sips of water to aid clearance; consider breaking larger pills into smaller pieces. Will follow for tolerance and education, briefly. SLP Visit Diagnosis Dysphagia, oropharyngeal phase (R13.12) Attention and  concentration deficit following -- Frontal lobe and executive function deficit following -- Impact on safety and function Mild aspiration risk   CHL IP TREATMENT RECOMMENDATION 11/19/2017 Treatment Recommendations Therapy as outlined in treatment plan below   Prognosis 11/19/2017 Prognosis for Safe Diet Advancement Good Barriers to Reach Goals Cognitive deficits Barriers/Prognosis Comment -- CHL IP DIET RECOMMENDATION 11/19/2017 SLP Diet Recommendations Regular solids;Thin liquid Liquid Administration via Cup;Straw Medication Administration Whole meds with liquid Compensations Slow rate;Small sips/bites;Minimize environmental distractions Postural Changes --   CHL IP OTHER RECOMMENDATIONS 11/18/2017 Recommended Consults -- Oral Care Recommendations -- Other Recommendations Order thickener from pharmacy;Prohibited food (jello, ice cream, thin soups);Remove water pitcher   CHL IP FOLLOW UP RECOMMENDATIONS 11/19/2017 Follow up Recommendations Other (comment)   CHL IP FREQUENCY AND DURATION 11/19/2017 Speech Therapy Frequency (ACUTE ONLY) min 1 x/week Treatment Duration 1 week      CHL IP ORAL PHASE 11/19/2017 Oral Phase Impaired Oral - Pudding Teaspoon -- Oral - Pudding Cup -- Oral - Honey Teaspoon -- Oral - Honey Cup -- Oral - Nectar Teaspoon -- Oral - Nectar Cup  -- Oral - Nectar Straw -- Oral - Thin Teaspoon -- Oral - Thin Cup Premature spillage Oral - Thin Straw Premature spillage Oral - Puree -- Oral - Mech Soft -- Oral - Regular -- Oral - Multi-Consistency Premature spillage;Delayed oral transit Oral - Pill -- Oral Phase - Comment --  CHL IP PHARYNGEAL PHASE 11/19/2017 Pharyngeal Phase Impaired Pharyngeal- Pudding Teaspoon -- Pharyngeal -- Pharyngeal- Pudding Cup -- Pharyngeal -- Pharyngeal- Honey Teaspoon -- Pharyngeal -- Pharyngeal- Honey Cup -- Pharyngeal -- Pharyngeal- Nectar Teaspoon -- Pharyngeal -- Pharyngeal- Nectar Cup -- Pharyngeal -- Pharyngeal- Nectar Straw -- Pharyngeal -- Pharyngeal- Thin Teaspoon -- Pharyngeal -- Pharyngeal- Thin Cup Delayed swallow initiation-pyriform sinuses;Penetration/Aspiration before swallow Pharyngeal Material enters airway, remains ABOVE vocal cords then ejected out;Material does not enter airway Pharyngeal- Thin Straw Delayed swallow initiation-pyriform sinuses;Penetration/Aspiration before swallow Pharyngeal Material does not enter airway;Material enters airway, remains ABOVE vocal cords then ejected out Pharyngeal- Puree WFL Pharyngeal -- Pharyngeal- Mechanical Soft -- Pharyngeal -- Pharyngeal- Regular WFL Pharyngeal -- Pharyngeal- Multi-consistency -- Pharyngeal -- Pharyngeal- Pill -- Pharyngeal -- Pharyngeal Comment --  CHL IP CERVICAL ESOPHAGEAL PHASE 11/19/2017 Cervical Esophageal Phase WFL Pudding Teaspoon -- Pudding Cup -- Honey Teaspoon -- Honey Cup -- Nectar Teaspoon -- Nectar Cup -- Nectar Straw -- Thin Teaspoon -- Thin Cup -- Thin Straw -- Puree -- Mechanical Soft -- Regular -- Multi-consistency -- Pill -- Cervical Esophageal Comment -- No flowsheet data found. Aliene Altes 11/19/2017, 11:42 AM Deneise Lever, MS, CCC-SLP Speech-Language Pathologist [phone]                  Scheduled Meds: . aspirin EC  325 mg Oral Daily  . baclofen  5 mg Oral TID  . escitalopram  10 mg Oral Daily  . famotidine  20 mg Oral  BID  . furosemide  40 mg Intravenous BID  . guaiFENesin  1,200 mg Oral BID  . hydrOXYzine  25 mg Oral QHS  . ipratropium-albuterol  3 mL Nebulization BID  . lisinopril  5 mg Oral Daily  . potassium chloride  40 mEq Oral BID  . rosuvastatin  20 mg Oral Daily  . sodium chloride flush  3 mL Intravenous Q12H   Continuous Infusions: . sodium chloride    . ampicillin-sulbactam (UNASYN) IV Stopped (11/19/17 0950)  . heparin 1,250 Units/hr (11/19/17 1120)     LOS: 3 days  Vernell Leep, MD, FACP, [facility]. [facility] Pager [phone]  If 7PM-7AM, please contact night-coverage [email or web address] Password [email or web address] 11/19/2017, 1:20 PM

## 2017-11-19 NOTE — Progress Notes (Signed)
ANTICOAGULATION CONSULT NOTE - [place] for Heparin Indication: ACS/STEMI  Allergies  Allergen Reactions  . Lopressor [Metoprolol Tartrate]     Angioedema  . Lipitor [Atorvastatin] Diarrhea   Patient Measurements: Height: 5\' 2"  (157.5 cm) Weight: 136 lb 11 oz (62 kg) IBW/kg (Calculated) : 50.1 HEPARIN DW (KG): 56.7  Vital Signs: Temp: (P) 98.2 F (36.8 C) (02/24 0741) Temp Source: (P) Oral (02/24 0741) BP: 134/66 (02/24 0920) Pulse Rate: (P) 90 (02/24 0741)  Labs: Recent Labs    11/16/17 1509 11/16/17 2004  11/17/17 0508 11/18/17 0350 11/18/17 1105 11/18/17 1958 11/19/17 0427  HGB  --   --   --  11.4*  --   --   --  13.6  HCT  --   --   --  36.5  --   --   --  43.6  PLT  --   --   --  335  --   --   --  373  HEPARINUNFRC  --   --    < > 0.51 <0.10* 0.15* 0.40 0.29*  CREATININE  --   --   --  0.63 0.64  --   --  0.73  TROPONINI 10.50* 6.71*  --   --   --   --   --   --    < > = values in this interval not displayed.   Estimated Creatinine Clearance: 67.2 mL/min (by C-G formula based on SCr of 0.73 mg/dL).  Assessment: 57 yo female continues on IV heparin for NSTEMI. No chest pain currently reported. Troponin peak to 10.5. Eventual catheterization pending medical stabilization.  Heparin level subtherapeutic this AM: 0.29 CBC stable, no bleeding noted  Goal of Therapy:  Heparin level goal 0.3-0.7 unit/ml Monitor platelets by anticoagulation protocol: Yes   Plan:  Heparin increase to 1250 units / hr Heparin level in 6 hours Planning cath when medically stable - follow-up plans   Bruna Dills L. Kyung Rudd, PharmD, [place] PGY1 Pharmacy Resident Pager: [phone]

## 2017-11-20 ENCOUNTER — Ambulatory Visit: Payer: BLUE CROSS/BLUE SHIELD | Admitting: Physical Therapy

## 2017-11-20 ENCOUNTER — Encounter: Payer: BLUE CROSS/BLUE SHIELD | Admitting: Occupational Therapy

## 2017-11-20 ENCOUNTER — Inpatient Hospital Stay (HOSPITAL_COMMUNITY)
Admission: EM | Disposition: A | Discharge status: Rehabilitation Facility | Payer: Self-pay | Source: Home / Self Care | Attending: Internal Medicine

## 2017-11-20 ENCOUNTER — Other Ambulatory Visit: Payer: Self-pay | Admitting: *Deleted

## 2017-11-20 DIAGNOSIS — R748 Abnormal levels of other serum enzymes: Secondary | ICD-10-CM

## 2017-11-20 DIAGNOSIS — I2510 Atherosclerotic heart disease of native coronary artery without angina pectoris: Secondary | ICD-10-CM

## 2017-11-20 HISTORY — PX: RIGHT/LEFT HEART CATH AND CORONARY ANGIOGRAPHY: CATH118266

## 2017-11-20 LAB — CBC
HEMATOCRIT: 43.3 % (ref 36.0–46.0)
HEMOGLOBIN: 13.8 g/dL (ref 12.0–15.0)
MCH: 29.7 pg (ref 26.0–34.0)
MCHC: 31.9 g/dL (ref 30.0–36.0)
MCV: 93.1 fL (ref 78.0–100.0)
Platelets: 364 10*3/uL (ref 150–400)
RBC: 4.65 MIL/uL (ref 3.87–5.11)
RDW: 14 % (ref 11.5–15.5)
WBC: 9.8 10*3/uL (ref 4.0–10.5)

## 2017-11-20 LAB — POCT I-STAT 3, ART BLOOD GAS (G3+)
Acid-base deficit: 1 mmol/L (ref 0.0–2.0)
BICARBONATE: 25.6 mmol/L (ref 20.0–28.0)
O2 Saturation: 93 %
PCO2 ART: 46.8 mmHg (ref 32.0–48.0)
PH ART: 7.346 — AB (ref 7.350–7.450)
PO2 ART: 72 mmHg — AB (ref 83.0–108.0)
TCO2: 27 mmol/L (ref 22–32)

## 2017-11-20 LAB — BASIC METABOLIC PANEL
Anion gap: 12 (ref 5–15)
BUN: 23 mg/dL — ABNORMAL HIGH (ref 6–20)
CALCIUM: 9.3 mg/dL (ref 8.9–10.3)
CHLORIDE: 101 mmol/L (ref 101–111)
CO2: 25 mmol/L (ref 22–32)
Creatinine, Ser: 0.8 mg/dL (ref 0.44–1.00)
GFR calc Af Amer: >60 mL/min (ref >60)
GFR calc non Af Amer: >60 mL/min (ref >60)
GLUCOSE: 95 mg/dL (ref 65–99)
Potassium: 4.3 mmol/L (ref 3.5–5.1)
Sodium: 138 mmol/L (ref 135–145)

## 2017-11-20 LAB — POCT I-STAT 3, VENOUS BLOOD GAS (G3P V)
BICARBONATE: 26.9 mmol/L (ref 20.0–28.0)
O2 SAT: 62 %
PCO2 VEN: 49.4 mmHg (ref 44.0–60.0)
TCO2: 28 mmol/L (ref 22–32)
pH, Ven: 7.344 (ref 7.250–7.430)
pO2, Ven: 34 mmHg (ref 32.0–45.0)

## 2017-11-20 LAB — HEPARIN LEVEL (UNFRACTIONATED): Heparin Unfractionated: 0.52 IU/mL (ref 0.30–0.70)

## 2017-11-20 LAB — POCT ACTIVATED CLOTTING TIME: ACTIVATED CLOTTING TIME: 131 s

## 2017-11-20 SURGERY — RIGHT/LEFT HEART CATH AND CORONARY ANGIOGRAPHY
Anesthesia: LOCAL

## 2017-11-20 MED ORDER — AMOXICILLIN-POT CLAVULANATE 875-125 MG PO TABS
1.0000 | ORAL_TABLET | Freq: Two times a day (BID) | ORAL | Status: DC
  Administered 2017-11-20 – 2017-11-22 (×5): 1 via ORAL
  Filled 2017-11-20 (×6): qty 1

## 2017-11-20 MED ORDER — ACETAMINOPHEN 325 MG PO TABS
650.0000 mg | ORAL_TABLET | ORAL | Status: DC | PRN
  Administered 2017-11-20: 650 mg via ORAL
  Filled 2017-11-20: qty 2

## 2017-11-20 MED ORDER — HEPARIN (PORCINE) IN NACL 100-0.45 UNIT/ML-% IJ SOLN
1100.0000 [IU]/h | INTRAMUSCULAR | Status: DC
  Administered 2017-11-20: 1250 [IU]/h via INTRAVENOUS
  Administered 2017-11-22: 1100 [IU]/h via INTRAVENOUS
  Filled 2017-11-20 (×3): qty 250

## 2017-11-20 MED ORDER — MIDAZOLAM HCL 2 MG/2ML IJ SOLN
INTRAMUSCULAR | Status: DC | PRN
  Administered 2017-11-20: 1 mg via INTRAVENOUS

## 2017-11-20 MED ORDER — MIDAZOLAM HCL 2 MG/2ML IJ SOLN
INTRAMUSCULAR | Status: AC
  Filled 2017-11-20: qty 2

## 2017-11-20 MED ORDER — DIPHENHYDRAMINE HCL 25 MG PO CAPS
25.0000 mg | ORAL_CAPSULE | ORAL | Status: DC | PRN
  Administered 2017-11-20 (×2): 25 mg via ORAL
  Filled 2017-11-20 (×2): qty 1

## 2017-11-20 MED ORDER — HEPARIN (PORCINE) IN NACL 2-0.9 UNIT/ML-% IJ SOLN
INTRAMUSCULAR | Status: AC | PRN
  Administered 2017-11-20 (×2): 500 mL via INTRA_ARTERIAL

## 2017-11-20 MED ORDER — HEPARIN (PORCINE) IN NACL 2-0.9 UNIT/ML-% IJ SOLN
INTRAMUSCULAR | Status: AC
  Filled 2017-11-20: qty 500

## 2017-11-20 MED ORDER — LIDOCAINE HCL (PF) 1 % IJ SOLN
INTRAMUSCULAR | Status: DC | PRN
  Administered 2017-11-20: 30 mL

## 2017-11-20 MED ORDER — IOPAMIDOL (ISOVUE-370) INJECTION 76%
INTRAVENOUS | Status: AC
  Filled 2017-11-20: qty 100

## 2017-11-20 MED ORDER — FENTANYL CITRATE (PF) 100 MCG/2ML IJ SOLN
INTRAMUSCULAR | Status: AC
  Filled 2017-11-20: qty 2

## 2017-11-20 MED ORDER — SODIUM CHLORIDE 0.9 % IV SOLN: 250.0000 mL | INTRAVENOUS | Status: DC | PRN

## 2017-11-20 MED ORDER — IOPAMIDOL (ISOVUE-370) INJECTION 76%
INTRAVENOUS | Status: DC | PRN
  Administered 2017-11-20: 40 mL via INTRA_ARTERIAL

## 2017-11-20 MED ORDER — SODIUM CHLORIDE 0.9% FLUSH: 3.0000 mL | INTRAVENOUS | Status: DC | PRN

## 2017-11-20 MED ORDER — SODIUM CHLORIDE 0.9% FLUSH
3.0000 mL | Freq: Two times a day (BID) | INTRAVENOUS | Status: DC
  Administered 2017-11-20: 3 mL via INTRAVENOUS

## 2017-11-20 MED ORDER — HYDROCORTISONE 1 % EX CREA
TOPICAL_CREAM | Freq: Three times a day (TID) | CUTANEOUS | Status: DC | PRN
  Administered 2017-11-20 – 2017-11-21 (×3): via TOPICAL
  Filled 2017-11-20 (×2): qty 28

## 2017-11-20 MED ORDER — ONDANSETRON HCL 4 MG/2ML IJ SOLN: 4.0000 mg | Freq: Four times a day (QID) | INTRAMUSCULAR | Status: DC | PRN

## 2017-11-20 MED ORDER — SODIUM CHLORIDE 0.9 % IV SOLN: INTRAVENOUS | Status: AC

## 2017-11-20 MED ORDER — LIDOCAINE HCL (PF) 1 % IJ SOLN
INTRAMUSCULAR | Status: AC
  Filled 2017-11-20: qty 30

## 2017-11-20 MED ORDER — FENTANYL CITRATE (PF) 100 MCG/2ML IJ SOLN
INTRAMUSCULAR | Status: DC | PRN
  Administered 2017-11-20: 25 ug via INTRAVENOUS

## 2017-11-20 SURGICAL SUPPLY — 11 items
CATH INFINITI 5FR MULTPACK ANG (CATHETERS) ×2 IMPLANT
CATH SWAN GANZ 7F STRAIGHT (CATHETERS) ×2 IMPLANT
COVER PRB 48X5XTLSCP FOLD TPE (BAG) ×1 IMPLANT
COVER PROBE 5X48 (BAG) ×1
KIT HEART LEFT (KITS) ×2 IMPLANT
PACK CARDIAC CATHETERIZATION (CUSTOM PROCEDURE TRAY) ×2 IMPLANT
SHEATH PINNACLE 5F 10CM (SHEATH) ×2 IMPLANT
SHEATH PINNACLE 7F 10CM (SHEATH) ×2 IMPLANT
TRANSDUCER W/STOPCOCK (MISCELLANEOUS) ×2 IMPLANT
WIRE EMERALD 3MM-J .035X150CM (WIRE) ×2 IMPLANT
WIRE HI TORQ VERSACORE-J 145CM (WIRE) ×2 IMPLANT

## 2017-11-20 NOTE — Progress Notes (Addendum)
ANTICOAGULATION CONSULT NOTE - [place] for Heparin Indication: ACS/STEMI  Allergies  Allergen Reactions  . Lopressor [Metoprolol Tartrate]     Angioedema  . Lipitor [Atorvastatin] Diarrhea   Patient Measurements: Height: 5\' 2"  (157.5 cm) Weight: 120 lb 12.8 oz (54.8 kg) IBW/kg (Calculated) : 50.1 HEPARIN DW (KG): 56.7  Vital Signs: Temp: 98.9 F (37.2 C) (02/25 1154) Temp Source: Axillary (02/25 1154) BP: 118/69 (02/25 1500) Pulse Rate: 87 (02/25 1500)  Labs: Recent Labs    11/18/17 0350  11/19/17 0427 11/19/17 1638 11/20/17 0421  HGB  --   --  13.6  --  13.8  HCT  --   --  43.6  --  43.3  PLT  --   --  373  --  364  HEPARINUNFRC <0.10*   < > 0.29* 0.54 0.52  CREATININE 0.64  --  0.73  --  0.80   < > = values in this interval not displayed.   Estimated Creatinine Clearance: 61.4 mL/min (by C-G formula based on SCr of 0.8 mg/dL).  Assessment: 57 yo female with NSTEMI. S/p cath, pharmacy is consulted to start heparin 7 hrs after sheath removal (~1415), pending CVTS consult.    Heparin level was therapeutic at 0.52 this morning on 1250 units/hr   Goal of Therapy:  Heparin level goal 0.3-0.7 unit/ml Monitor platelets by anticoagulation protocol: Yes   Plan:  Restart heparin at 1250 units / hr with no bolus at 2100 F/u heparin level at 0300 F/u CVTS plans  Maryanna Shape, PharmD, BCPS  Clinical Pharmacist  Pager: [phone]  Addendum: Pharmacy is also consulted to dose augmentin to complete a 7 day total treatment for aspiration pneumonia. Pt was on unasyn 2/21 >> 2/25. crcl ~ 60 ml/min. Last dose of unasyn was at 0930 this morning.  Plan: Augmentin 875-125mg  PO BID x 6 more doses.  Maryanna Shape, PharmD, BCPS  Clinical Pharmacist  Pager: [phone]

## 2017-11-20 NOTE — Progress Notes (Signed)
PROGRESS NOTE   [name]  QQV:[id number]    DOB: June 22, 1961    DOA: 11/16/2017  PCP: [name], DO   I have briefly reviewed patients previous medical records in [facility].  Brief Narrative:  57 year old female with PMH of stroke with residual right hemiparesis and 07/2017, completed rehab in December and returned home, presented initially to [facility] with few days history of cough without chest pain and then became suddenly short of breath on night prior to admission.  In ED, significantly hypoxic, required BiPAP, CTA chest negative for PE but showed bibasilar pneumonia suspicious for aspiration.  Course complicated by elevated troponin, echo with low EF, Cardiology evaluated for NSTEMI and recommended transfer to [facility] for eventual cardiac catheterization.  Intermittently requiring BiPAP.  Cardiology following.  Improving.  Diet advanced to regular and thin liquids.  S/p right and left heart cath on 2/25 > Severe LAD disease and low EF. TCTS consulted by Cardiology..   Assessment & Plan:   Active Problems:   Right hemiplegia ([place])   Hypokalemia   Former smoker   Acute respiratory failure with hypoxia (HCC)   Aspiration pneumonia (HCC)   Elevated troponin   NSTEMI (non-ST elevated myocardial infarction) ([place])   1. Aspiration pneumonia: Likely from dysphagia due to recent stroke.  CTA chest 2/21: No PE but showed dependent infiltrates in both lower lobes.  Continue empirically started IV Unasyn.  Blood cultures x2: Negative to date.  Improved. Transitioned to oral Augmentin 2/25. 2. Dysphagia: Suspected due to recent acute stroke.  Off of BiPAP/Ventimask this morning.  Speech therapy has advance diet from dysphagia 3 diet and nectar thickened liquids on 2/23 to regular diet and thin liquids on 2/24. 3. NSTEMI: No chest pain reported.  Troponin peaked to 10.5.  Continue IV heparin drip.  Continue aspirin, statin.  Allergic to beta blockers.  Cardizem discontinued  due to low EF.  S/P left and right heart cath 2/25 showed severe LAD disease with low EF 25-35%. Cards consulted TCTS for bypass evaluation. 4. Cardiomyopathy: Cardiology following.  Started lisinopril. 5. Acute systolic CHF: TTE 3/32/95: LVEF 35-40%, diffuse hypokinesis and grade 2 diastolic dysfunction.  IV Lasix increased to 40 mg twice daily.  ACEI started.  Improving.  -2.4 L since admission. BNP 1277 on 2/23.  Improved. IV Lasix stopped precath. Improved. 6. Acute respiratory failure with hypoxia: Due to aspiration pneumonia and acute CHF.  No PE by CT.  PRN BiPAP, oxygen.  Able to wean down to 3 L [place] oxygen.  Continue to wean to room air as tolerated for saturation >92%. 7. Normocytic anemia: Likely lab error.  Hemoglobin 13.6 on 2/24. 8. Hypokalemia: Replaced.  Magnesium 1.9.  9. History of stroke with right hemiparesis: Continue aspirin and statins.   DVT prophylaxis: Currently on IV heparin drip. Code Status: Full. Family Communication: None at bedside today. Disposition: To be determined pending clinical improvement and further hospital evaluation.   Consultants:  Cardiology TCTS-pending.  Procedures:  BiPAP  Antimicrobials:  Unasyn- DC'ed PO Augmentin   Subjective: Seen prior to procedure this am. Denies dyspnea or cough. Itchy rash on both buttocks> states had in past and had to use hypoallergenic linen and stopped some meds> cannot recall names.  She was interviewed and examined with a female RN in room.  ROS: As above.  Objective:  Vitals:   11/20/17 1440 11/20/17 1445 11/20/17 1500 11/20/17 1515  BP: 115/78 132/64 118/69 107/73  Pulse: 85 91 87  87  Resp: (!) 21 (!) 23 10 (!) 22  Temp:      TempSrc:      SpO2: 91% 93% 93% 96%  Weight:      Height:        Examination:  General exam: Pleasant middle-aged female, moderately built and nourished lying comfortably propped up in bed.  Does not appear in any distress. Stable Respiratory system: Clear to  auscultation.  No increased work of breathing. Cardiovascular system: S1 & S2 heard, RRR. No JVD, murmurs, rubs, gallops or clicks. No pedal edema.  Telemetry personally reviewed: Sinus rhythm. No change Gastrointestinal system: Abdomen is nondistended, soft and nontender. No organomegaly or masses felt. Normal bowel sounds heard.  Stable without change. Central nervous system: Alert and oriented. No focal neurological deficits.  Stable without change. Extremities: Left limbs grade 5 x 5 power.  Right upper extremity get 0 x 5 power.  Right lower extremity grade 4 x 5 power. Skin: Patchy areas of pruritic hives on both hips and upper thighs. Psychiatry: Judgement and insight appear normal. Mood & affect appropriate.     Data Reviewed: I have personally reviewed following labs and imaging studies  CBC: Recent Labs  Lab 11/16/17 0528 11/17/17 0508 11/19/17 0427 11/20/17 0421  WBC 10.7* 11.8* 9.2 9.8  NEUTROABS 8.2*  --   --   --   HGB 12.8 11.4* 13.6 13.8  HCT 40.8 36.5 43.6 43.3  MCV 94.2 93.4 94.2 93.1  PLT 367 335 373 505   Basic Metabolic Panel: Recent Labs  Lab 11/16/17 0528 11/16/17 0911 11/17/17 0508 11/18/17 0350 11/19/17 0427 11/20/17 0421  NA 139  --  142 141 139 138  K 3.2*  --  4.0 3.2* 4.1 4.3  CL 106  --  108 101 101 101  CO2 23  --  24 28 27 25   GLUCOSE 142*  --  113* 81 80 95  BUN 22*  --  20 20 24* 23*  CREATININE 0.65  --  0.63 0.64 0.73 0.80  CALCIUM 8.9  --  9.0 9.0 9.3 9.3  MG  --  1.9  --   --   --   --    Liver Function Tests: Recent Labs  Lab 11/17/17 0508 11/19/17 0427  AST 64* 31  ALT 26 19  ALKPHOS 63 65  BILITOT 0.5 0.8  PROT 6.2* 7.2  ALBUMIN 3.3* 3.4*   Coagulation Profile: Recent Labs  Lab 11/16/17 0528  INR 0.87   Cardiac Enzymes: Recent Labs  Lab 11/16/17 0528 11/16/17 0911 11/16/17 1509 11/16/17 2004  TROPONINI 0.41* 3.02* 10.50* 6.71*     Recent Results (from the past 240 hour(s))  Blood culture (routine x 2)      Status: None (Preliminary result)   Collection Time: 11/16/17  5:32 AM  Result Value Ref Range Status   Specimen Description BLOOD LEFT ARM  Final   Special Requests   Final    BOTTLES DRAWN AEROBIC AND ANAEROBIC Blood Culture adequate volume   Culture   Final    NO GROWTH 4 DAYS Performed at [facility], [address] [place],  [address]    Report Status PENDING  Incomplete  Blood culture (routine x 2)     Status: None (Preliminary result)   Collection Time: 11/16/17  5:37 AM  Result Value Ref Range Status   Specimen Description BLOOD LEFT ARM  Final   Special Requests   Final    Blood Culture results  may not be optimal due to an inadequate volume of blood received in culture bottles BOTTLES DRAWN AEROBIC ONLY   Culture   Final    NO GROWTH 4 DAYS Performed at [facility], [address], [place], [place] [address]    Report Status PENDING  Incomplete         Radiology Studies: Dg Swallowing Func-speech Pathology  Result Date: 11/19/2017 Objective Swallowing Evaluation: Type of Study: MBS-Modified Barium Swallow Study  Patient Details Name: [name] MRN: [id number] Date of Birth: 04-30-1961 Today's Date: 11/19/2017 Time: SLP Start Time (ACUTE ONLY): 1050 -SLP Stop Time (ACUTE ONLY): 1117 SLP Time Calculation (min) (ACUTE ONLY): 27 min Past Medical History: Past Medical History: Diagnosis Date . Arthritis   "knees" (11/16/2017) . Hay fever  . Pneumonia 11/16/2017 . Stroke [facility]) 08/23/2017  Left MCA infarct status post TPA and thrombectomy Past Surgical History: Past Surgical History: Procedure Laterality Date . CRYOABLATION    cervical . IR ANGIO INTRA EXTRACRAN SEL COM CAROTID INNOMINATE UNI R MOD SED  08/21/2017 . IR ANGIO VERTEBRAL SEL SUBCLAVIAN INNOMINATE UNI R MOD SED  08/21/2017 . IR PERCUTANEOUS ART THROMBECTOMY/INFUSION INTRACRANIAL INC DIAG ANGIO  08/21/2017 . IR RADIOLOGIST EVAL & MGMT  10/30/2017 . LOOP RECORDER INSERTION N/A 08/28/2017  Procedure: LOOP  RECORDER INSERTION;  Surgeon: Constance Haw, MD;  Location: [facility] [facility];  Service: Cardiovascular;  Laterality: N/A; . RADIOLOGY WITH ANESTHESIA N/A 08/21/2017  Procedure: RADIOLOGY WITH ANESTHESIA;  Surgeon: Luanne Bras, MD;  Location: [place];  Service: Radiology;  Laterality: N/A; . TEE WITHOUT CARDIOVERSION N/A 08/28/2017  Procedure: TRANSESOPHAGEAL ECHOCARDIOGRAM (TEE);  Surgeon: Fay Records, MD;  Location: [facility] ENDOSCOPY;  Service: Cardiovascular;  Laterality: N/A; . TUBAL LIGATION   HPI: Kaitlyn G Griffinis a 57 y.o.femalewith medical history significant ofsignificant stroke in 07/2017. She was in inpatient rehab until December 20, after which she returned home. She presented to ED with SOB where she was noted to be significantly hypoxic. She was placed on high flow nasal cannula but subsequently required BiPAP therapy. Imaging indicates bilateral lower lobe pneumonia, suspicious for aspiration. Pt had MBS 08/25/18 showing moderate oral and mild pharyngeal dysphagia mostly characterized by decreased oral transiting coordination resulting premature spillage of boluses into pharynx; dys 3 with thin liquids and chin tuck was recommended. Progressed to regular textures, thin liquids in CIR and was drinking with head neutral position with no overt s/sx of aspiration.  Subjective: Pt reports she is thirsty Assessment / Plan / Recommendation CHL IP CLINICAL IMPRESSIONS 11/19/2017 Clinical Impression Patient presents with mild oropharyngeal dysphagia; no aspiration observed on this examination. Oral stage notable for premature spillage of larger boluses of thin liquids, as well as delayed oral transit with mixed consistency (barium tablet and thin liquid). Pt took a large sip of liquid with barium tablet and tilted into neck extension in an apparent effort to aid transit. Swallow initiation was delayed with thin liquids to the pyriform sinuses. Trace, reversible penetration resulted due to  delayed closure of the laryngeal vestibule. Pharyngeal stage characterized by adequate tongue base retraction, hyolargyneal excursion and pharyngeal constriction. An esophageal sweep revealed slow clearance of contrast and barium tablet through the distal esophagus, tablet was not observed to pass through GE junction despite subsequent swallows of puree, thin liquids. Recommend regular diet with thin liquids, chin tuck is not needed for airway protection. Single, smaller sips do improve oral control and minimize risk for airway compromise. Pt told SLP she typically takes "4  or 5" pills at a time and "throws" her head back. SLP educated re: taking pills one a time with small sips of liquid or whole in puree to aid transit, follow with sips of water to aid clearance; consider breaking larger pills into smaller pieces. Will follow for tolerance and education, briefly. SLP Visit Diagnosis Dysphagia, oropharyngeal phase (R13.12) Attention and concentration deficit following -- Frontal lobe and executive function deficit following -- Impact on safety and function Mild aspiration risk   CHL IP TREATMENT RECOMMENDATION 11/19/2017 Treatment Recommendations Therapy as outlined in treatment plan below   Prognosis 11/19/2017 Prognosis for Safe Diet Advancement Good Barriers to Reach Goals Cognitive deficits Barriers/Prognosis Comment -- CHL IP DIET RECOMMENDATION 11/19/2017 SLP Diet Recommendations Regular solids;Thin liquid Liquid Administration via Cup;Straw Medication Administration Whole meds with liquid Compensations Slow rate;Small sips/bites;Minimize environmental distractions Postural Changes --   CHL IP OTHER RECOMMENDATIONS 11/18/2017 Recommended Consults -- Oral Care Recommendations -- Other Recommendations Order thickener from pharmacy;Prohibited food (jello, ice cream, thin soups);Remove water pitcher   CHL IP FOLLOW UP RECOMMENDATIONS 11/19/2017 Follow up Recommendations Other (comment)   CHL IP FREQUENCY AND DURATION  11/19/2017 Speech Therapy Frequency (ACUTE ONLY) min 1 x/week Treatment Duration 1 week      CHL IP ORAL PHASE 11/19/2017 Oral Phase Impaired Oral - Pudding Teaspoon -- Oral - Pudding Cup -- Oral - Honey Teaspoon -- Oral - Honey Cup -- Oral - Nectar Teaspoon -- Oral - Nectar Cup -- Oral - Nectar Straw -- Oral - Thin Teaspoon -- Oral - Thin Cup Premature spillage Oral - Thin Straw Premature spillage Oral - Puree -- Oral - Mech Soft -- Oral - Regular -- Oral - Multi-Consistency Premature spillage;Delayed oral transit Oral - Pill -- Oral Phase - Comment --  CHL IP PHARYNGEAL PHASE 11/19/2017 Pharyngeal Phase Impaired Pharyngeal- Pudding Teaspoon -- Pharyngeal -- Pharyngeal- Pudding Cup -- Pharyngeal -- Pharyngeal- Honey Teaspoon -- Pharyngeal -- Pharyngeal- Honey Cup -- Pharyngeal -- Pharyngeal- Nectar Teaspoon -- Pharyngeal -- Pharyngeal- Nectar Cup -- Pharyngeal -- Pharyngeal- Nectar Straw -- Pharyngeal -- Pharyngeal- Thin Teaspoon -- Pharyngeal -- Pharyngeal- Thin Cup Delayed swallow initiation-pyriform sinuses;Penetration/Aspiration before swallow Pharyngeal Material enters airway, remains ABOVE vocal cords then ejected out;Material does not enter airway Pharyngeal- Thin Straw Delayed swallow initiation-pyriform sinuses;Penetration/Aspiration before swallow Pharyngeal Material does not enter airway;Material enters airway, remains ABOVE vocal cords then ejected out Pharyngeal- Puree WFL Pharyngeal -- Pharyngeal- Mechanical Soft -- Pharyngeal -- Pharyngeal- Regular WFL Pharyngeal -- Pharyngeal- Multi-consistency -- Pharyngeal -- Pharyngeal- Pill -- Pharyngeal -- Pharyngeal Comment --  CHL IP CERVICAL ESOPHAGEAL PHASE 11/19/2017 Cervical Esophageal Phase WFL Pudding Teaspoon -- Pudding Cup -- Honey Teaspoon -- Honey Cup -- Nectar Teaspoon -- Nectar Cup -- Nectar Straw -- Thin Teaspoon -- Thin Cup -- Thin Straw -- Puree -- Mechanical Soft -- Regular -- Multi-consistency -- Pill -- Cervical Esophageal Comment -- No  flowsheet data found. Aliene Altes 11/19/2017, 11:42 AM Deneise Lever, MS, CCC-SLP Speech-Language Pathologist [phone]                  Scheduled Meds: . amoxicillin-clavulanate  1 tablet Oral Q12H  . aspirin EC  325 mg Oral Daily  . baclofen  5 mg Oral TID  . escitalopram  10 mg Oral Daily  . famotidine  20 mg Oral BID  . guaiFENesin  1,200 mg Oral BID  . hydrOXYzine  25 mg Oral QHS  . ipratropium-albuterol  3 mL Nebulization BID  . lisinopril  5  mg Oral Daily  . rosuvastatin  20 mg Oral Daily  . sodium chloride flush  3 mL Intravenous Q12H  . sodium chloride flush  3 mL Intravenous Q12H   Continuous Infusions: . sodium chloride    . sodium chloride 75 mL/hr at 11/20/17 1419  . sodium chloride    . heparin       LOS: 4 days     Vernell Leep, MD, FACP, [facility]. [facility] Pager [phone]  If 7PM-7AM, please contact night-coverage [email or web address] Password [facility] 11/20/2017, 3:35 PM

## 2017-11-20 NOTE — Progress Notes (Signed)
TCTS consult called per Dr. Hassell Done request. Melina Copa PA-C

## 2017-11-20 NOTE — Progress Notes (Signed)
Progress Note  Patient Name: [name] Date of Encounter: 11/20/2017  Primary Cardiologist: Johnny Bridge  Subjective   Feels better.  Denies dyspnea.  Still has congestion.  No chest pain.  Inpatient Medications    Scheduled Meds: . aspirin EC  325 mg Oral Daily  . baclofen  5 mg Oral TID  . escitalopram  10 mg Oral Daily  . famotidine  20 mg Oral BID  . guaiFENesin  1,200 mg Oral BID  . hydrOXYzine  25 mg Oral QHS  . ipratropium-albuterol  3 mL Nebulization BID  . lisinopril  5 mg Oral Daily  . rosuvastatin  20 mg Oral Daily  . sodium chloride flush  3 mL Intravenous Q12H  . sodium chloride flush  3 mL Intravenous Q12H   Continuous Infusions: . sodium chloride    . sodium chloride    . sodium chloride 10 mL/hr at 11/20/17 0553  . ampicillin-sulbactam (UNASYN) IV Stopped (11/20/17 0540)  . heparin 1,250 Units/hr (11/20/17 0550)   PRN Meds: sodium chloride, sodium chloride, albuterol, diphenhydrAMINE, food thickener, hydrocortisone cream, loperamide, pneumococcal 23 valent vaccine, RESOURCE THICKENUP CLEAR, sodium chloride flush, sodium chloride flush   Vital Signs    Vitals:   11/20/17 0206 11/20/17 0307 11/20/17 0811 11/20/17 0821  BP:  126/78 117/72   Pulse:    97  Resp:  15    Temp:  98.6 F (37 C) 97.6 F (36.4 C)   TempSrc:  Oral Oral   SpO2:  99%  96%  Weight: 120 lb 12.8 oz (54.8 kg)     Height:        Intake/Output Summary (Last 24 hours) at 11/20/2017 0855 Last data filed at 11/20/2017 0800 Gross per 24 hour  Intake 1427.17 ml  Output 2000 ml  Net -572.83 ml   Filed Weights   11/18/17 0427 11/19/17 0415 11/20/17 0206  Weight: 143 lb 4.8 oz (65 kg) 136 lb 11 oz (62 kg) 120 lb 12.8 oz (54.8 kg)    Telemetry    NSR - Personally Reviewed  ECG    Admitting EKG demonstrated diffuse ST segment depression on 11/16/2017.  A repeat tracing on 11/17/2017 reveals sinus tachycardia with diffuse ST T abnormalities although not as severe on  11/16/17.- Personally Reviewed  Physical Exam  Lying flat, no distress, GEN: No acute distress.   Neck: No JVD Cardiac: RRR, no murmurs, rubs, or gallops.  Respiratory: Clear to auscultation bilaterally. GI: Soft, nontender, non-distended  MS: No edema; No deformity. Neuro:  Flaccid right arm.  Weak right lower extremity.  Speech is near normal. Psych: Normal affect   Labs    Chemistry Recent Labs  Lab 11/17/17 0508 11/18/17 0350 11/19/17 0427 11/20/17 0421  NA 142 141 139 138  K 4.0 3.2* 4.1 4.3  CL 108 101 101 101  CO2 24 28 27 25   GLUCOSE 113* 81 80 95  BUN 20 20 24* 23*  CREATININE 0.63 0.64 0.73 0.80  CALCIUM 9.0 9.0 9.3 9.3  PROT 6.2*  --  7.2  --   ALBUMIN 3.3*  --  3.4*  --   AST 64*  --  31  --   ALT 26  --  19  --   ALKPHOS 63  --  65  --   BILITOT 0.5  --  0.8  --   GFRNONAA >60 >60 >60 >60  GFRAA >60 >60 >60 >60  ANIONGAP 10 12 11 12      Hematology  Recent Labs  Lab 11/17/17 0508 11/19/17 0427 11/20/17 0421  WBC 11.8* 9.2 9.8  RBC 3.91 4.63 4.65  HGB 11.4* 13.6 13.8  HCT 36.5 43.6 43.3  MCV 93.4 94.2 93.1  MCH 29.2 29.4 29.7  MCHC 31.2 31.2 31.9  RDW 14.3 14.1 14.0  PLT 335 373 364    Cardiac Enzymes Recent Labs  Lab 11/16/17 0528 11/16/17 0911 11/16/17 1509 11/16/17 2004  TROPONINI 0.41* 3.02* 10.50* 6.71*   No results for input(s): TROPIPOC in the last 168 hours.   BNP Recent Labs  Lab 11/16/17 0528 11/18/17 0350  BNP 675.0* 1,276.7*     DDimer  Recent Labs  Lab 11/16/17 0528  DDIMER 1.08*     Radiology    Dg Swallowing Func-speech Pathology  Result Date: 11/19/2017 Objective Swallowing Evaluation: Type of Study: MBS-Modified Barium Swallow Study  Patient Details Name: [name] MRN: [id number] Date of Birth: 1960-12-03 Today's Date: 11/19/2017 Time: SLP Start Time (ACUTE ONLY): 1050 -SLP Stop Time (ACUTE ONLY): 1117 SLP Time Calculation (min) (ACUTE ONLY): 27 min Past Medical History: Past Medical History:  Diagnosis Date . Arthritis   "knees" (11/16/2017) . Hay fever  . Pneumonia 11/16/2017 . Stroke [facility]) 08/23/2017  Left MCA infarct status post TPA and thrombectomy Past Surgical History: Past Surgical History: Procedure Laterality Date . CRYOABLATION    cervical . IR ANGIO INTRA EXTRACRAN SEL COM CAROTID INNOMINATE UNI R MOD SED  08/21/2017 . IR ANGIO VERTEBRAL SEL SUBCLAVIAN INNOMINATE UNI R MOD SED  08/21/2017 . IR PERCUTANEOUS ART THROMBECTOMY/INFUSION INTRACRANIAL INC DIAG ANGIO  08/21/2017 . IR RADIOLOGIST EVAL & MGMT  10/30/2017 . LOOP RECORDER INSERTION N/A 08/28/2017  Procedure: LOOP RECORDER INSERTION;  Surgeon: Constance Haw, MD;  Location: [facility];  Service: Cardiovascular;  Laterality: N/A; . RADIOLOGY WITH ANESTHESIA N/A 08/21/2017  Procedure: RADIOLOGY WITH ANESTHESIA;  Surgeon: Luanne Bras, MD;  Location: [facility];  Service: Radiology;  Laterality: N/A; . TEE WITHOUT CARDIOVERSION N/A 08/28/2017  Procedure: TRANSESOPHAGEAL ECHOCARDIOGRAM (TEE);  Surgeon: Fay Records, MD;  Location: [facility];  Service: Cardiovascular;  Laterality: N/A; . TUBAL LIGATION   HPI: Meko G Griffinis a 57 y.o.femalewith medical history significant ofsignificant stroke in 07/2017. She was in inpatient rehab until December 20, after which she returned home. She presented to ED with SOB where she was noted to be significantly hypoxic. She was placed on high flow nasal cannula but subsequently required BiPAP therapy. Imaging indicates bilateral lower lobe pneumonia, suspicious for aspiration. Pt had MBS 08/25/18 showing moderate oral and mild pharyngeal dysphagia mostly characterized by decreased oral transiting coordination resulting premature spillage of boluses into pharynx; dys 3 with thin liquids and chin tuck was recommended. Progressed to regular textures, thin liquids in CIR and was drinking with head neutral position with no overt s/sx of aspiration.  Subjective: Pt reports she is thirsty  Assessment / Plan / Recommendation CHL IP CLINICAL IMPRESSIONS 11/19/2017 Clinical Impression Patient presents with mild oropharyngeal dysphagia; no aspiration observed on this examination. Oral stage notable for premature spillage of larger boluses of thin liquids, as well as delayed oral transit with mixed consistency (barium tablet and thin liquid). Pt took a large sip of liquid with barium tablet and tilted into neck extension in an apparent effort to aid transit. Swallow initiation was delayed with thin liquids to the pyriform sinuses. Trace, reversible penetration resulted due to delayed closure of the laryngeal vestibule. Pharyngeal stage characterized by adequate tongue base retraction, hyolargyneal excursion and  pharyngeal constriction. An esophageal sweep revealed slow clearance of contrast and barium tablet through the distal esophagus, tablet was not observed to pass through GE junction despite subsequent swallows of puree, thin liquids. Recommend regular diet with thin liquids, chin tuck is not needed for airway protection. Single, smaller sips do improve oral control and minimize risk for airway compromise. Pt told SLP she typically takes "4 or 5" pills at a time and "throws" her head back. SLP educated re: taking pills one a time with small sips of liquid or whole in puree to aid transit, follow with sips of water to aid clearance; consider breaking larger pills into smaller pieces. Will follow for tolerance and education, briefly. SLP Visit Diagnosis Dysphagia, oropharyngeal phase (R13.12) Attention and concentration deficit following -- Frontal lobe and executive function deficit following -- Impact on safety and function Mild aspiration risk   CHL IP TREATMENT RECOMMENDATION 11/19/2017 Treatment Recommendations Therapy as outlined in treatment plan below   Prognosis 11/19/2017 Prognosis for Safe Diet Advancement Good Barriers to Reach Goals Cognitive deficits Barriers/Prognosis Comment -- CHL IP DIET  RECOMMENDATION 11/19/2017 SLP Diet Recommendations Regular solids;Thin liquid Liquid Administration via Cup;Straw Medication Administration Whole meds with liquid Compensations Slow rate;Small sips/bites;Minimize environmental distractions Postural Changes --   CHL IP OTHER RECOMMENDATIONS 11/18/2017 Recommended Consults -- Oral Care Recommendations -- Other Recommendations Order thickener from pharmacy;Prohibited food (jello, ice cream, thin soups);Remove water pitcher   CHL IP FOLLOW UP RECOMMENDATIONS 11/19/2017 Follow up Recommendations Other (comment)   CHL IP FREQUENCY AND DURATION 11/19/2017 Speech Therapy Frequency (ACUTE ONLY) min 1 x/week Treatment Duration 1 week      CHL IP ORAL PHASE 11/19/2017 Oral Phase Impaired Oral - Pudding Teaspoon -- Oral - Pudding Cup -- Oral - Honey Teaspoon -- Oral - Honey Cup -- Oral - Nectar Teaspoon -- Oral - Nectar Cup -- Oral - Nectar Straw -- Oral - Thin Teaspoon -- Oral - Thin Cup Premature spillage Oral - Thin Straw Premature spillage Oral - Puree -- Oral - Mech Soft -- Oral - Regular -- Oral - Multi-Consistency Premature spillage;Delayed oral transit Oral - Pill -- Oral Phase - Comment --  CHL IP PHARYNGEAL PHASE 11/19/2017 Pharyngeal Phase Impaired Pharyngeal- Pudding Teaspoon -- Pharyngeal -- Pharyngeal- Pudding Cup -- Pharyngeal -- Pharyngeal- Honey Teaspoon -- Pharyngeal -- Pharyngeal- Honey Cup -- Pharyngeal -- Pharyngeal- Nectar Teaspoon -- Pharyngeal -- Pharyngeal- Nectar Cup -- Pharyngeal -- Pharyngeal- Nectar Straw -- Pharyngeal -- Pharyngeal- Thin Teaspoon -- Pharyngeal -- Pharyngeal- Thin Cup Delayed swallow initiation-pyriform sinuses;Penetration/Aspiration before swallow Pharyngeal Material enters airway, remains ABOVE vocal cords then ejected out;Material does not enter airway Pharyngeal- Thin Straw Delayed swallow initiation-pyriform sinuses;Penetration/Aspiration before swallow Pharyngeal Material does not enter airway;Material enters airway, remains ABOVE  vocal cords then ejected out Pharyngeal- Puree WFL Pharyngeal -- Pharyngeal- Mechanical Soft -- Pharyngeal -- Pharyngeal- Regular WFL Pharyngeal -- Pharyngeal- Multi-consistency -- Pharyngeal -- Pharyngeal- Pill -- Pharyngeal -- Pharyngeal Comment --  CHL IP CERVICAL ESOPHAGEAL PHASE 11/19/2017 Cervical Esophageal Phase WFL Pudding Teaspoon -- Pudding Cup -- Honey Teaspoon -- Honey Cup -- Nectar Teaspoon -- Nectar Cup -- Nectar Straw -- Thin Teaspoon -- Thin Cup -- Thin Straw -- Puree -- Mechanical Soft -- Regular -- Multi-consistency -- Pill -- Cervical Esophageal Comment -- No flowsheet data found. Aliene Altes 11/19/2017, 11:42 AM Deneise Lever, MS, CCC-SLP Speech-Language Pathologist [phone]              Cardiac Studies   2D Doppler echocardiogram 11/16/17:  Study Conclusions   - Left ventricle: The cavity size was mildly dilated. Wall   thickness was increased in a pattern of mild LVH. Systolic   function was moderately reduced. The estimated ejection fraction   was in the range of 35% to 40%. Diffuse hypokinesis. There is   akinesis of the basal-midinferolateral, inferior, and   inferoseptal myocardium. Features are consistent with a   pseudonormal left ventricular filling pattern, with concomitant   abnormal relaxation and increased filling pressure (grade 2   diastolic dysfunction). - Mitral valve: Mildly thickened leaflets . There was mild to   moderate regurgitation. - Left atrium: The atrium was mildly dilated. - Right atrium: Central venous pressure (est): 8 mm Hg. - Atrial septum: No defect or patent foramen ovale was identified. - Tricuspid valve: There was trivial regurgitation. - Pulmonary arteries: Systolic pressure could not be accurately   estimated. - Pericardium, extracardiac: There was no pericardial effusion.   Patient Profile     57 y.o. female with a history of left MCA stroke in November 2018, tobacco abuse, presenting with shortness of breath and possible  aspiration pneumonia as well as NSTEMI and possible ischemic cardiomyopathy, LVEF 35-40%.    Assessment & Plan    1.  Non-ST elevation myocardial infarction, acute on admission.  Etiology is uncertain.  Rule out CAD versus stress cardiomyopathy. 2.  Heart failure with reduced ejection fraction likely ischemic in origin.  Further evaluation for coronary disease is mandated. 3.  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 4.  Probable bilateral aspiration pneumonia, currently on therapy.  Scheduled to undergo left and right heart catheterization with coronary angiography to help further characterize etiology of decreased EF and elevated cardiac markers.  The patient was counseled to undergo left heart catheterization, coronary angiography, and possible percutaneous coronary intervention with stent implantation. The procedural risks and benefits were discussed in detail. The risks discussed included death, stroke, myocardial infarction, life-threatening bleeding, limb ischemia, kidney injury, allergy, and possible emergency cardiac surgery. The risk of these significant complications were estimated to occur less than 1% of the time. After discussion, the patient has agreed to proceed.   For questions or updates, please contact [place] Please consult [email or web address] for contact info under Cardiology/STEMI.      Signed, Sinclair Grooms, MD  11/20/2017, 8:55 AM

## 2017-11-20 NOTE — Interval H&P Note (Signed)
Cath Lab Visit (complete for each Cath Lab visit)  Clinical Evaluation Leading to the Procedure:   ACS: Yes.    Non-ACS:    Anginal Classification: CCS IV  Anti-ischemic medical therapy: Minimal Therapy (1 class of medications)  Non-Invasive Test Results: No non-invasive testing performed  Prior CABG: No previous CABG   Low EF   History and Physical Interval Note:  11/20/2017 1:12 PM  [name]  has presented today for surgery, with the diagnosis of NSTEMI  The various methods of treatment have been discussed with the patient and family. After consideration of risks, benefits and other options for treatment, the patient has consented to  Procedure(s): RIGHT/LEFT HEART CATH AND CORONARY ANGIOGRAPHY (N/A) as a surgical intervention .  The patient's history has been reviewed, patient examined, no change in status, stable for surgery.  I have reviewed the patient's chart and labs.  Questions were answered to the patient's satisfaction.     Larae Grooms

## 2017-11-20 NOTE — Progress Notes (Signed)
Site area: rt groin fa and fv sheaths Site Prior to Removal:  Level 0 Pressure Applied For:  20 minutes Manual:   yes Patient Status During Pull:  stable Post Pull Site:  Level  0 Post Pull Instructions Given:  yes Post Pull Pulses Present: palpable DP Dressing Applied:  Gauze and tegaderm Bedrest begins @ [id number] Comments:

## 2017-11-20 NOTE — Progress Notes (Signed)
At apporx 8315 pt complained of itching to bilateral hips/buttocks. Pt had visible raised hives to stated areas. Hydrocortisone cream ordered per previous General PRN order/applied to areas. Pt continues to complain of itching-areas appear less red but still visible. Pt states this happens to her from time to time and is requesting Benadryl. Provider on call text paged. Will continue to monitor. Jessie Foot, RN

## 2017-11-20 NOTE — H&P (View-Only) (Signed)
Progress Note  Patient Name: [name] Date of Encounter: 11/20/2017  Primary Cardiologist: Johnny Bridge  Subjective   Feels better.  Denies dyspnea.  Still has congestion.  No chest pain.  Inpatient Medications    Scheduled Meds: . aspirin EC  325 mg Oral Daily  . baclofen  5 mg Oral TID  . escitalopram  10 mg Oral Daily  . famotidine  20 mg Oral BID  . guaiFENesin  1,200 mg Oral BID  . hydrOXYzine  25 mg Oral QHS  . ipratropium-albuterol  3 mL Nebulization BID  . lisinopril  5 mg Oral Daily  . rosuvastatin  20 mg Oral Daily  . sodium chloride flush  3 mL Intravenous Q12H  . sodium chloride flush  3 mL Intravenous Q12H   Continuous Infusions: . sodium chloride    . sodium chloride    . sodium chloride 10 mL/hr at 11/20/17 0553  . ampicillin-sulbactam (UNASYN) IV Stopped (11/20/17 0540)  . heparin 1,250 Units/hr (11/20/17 0550)   PRN Meds: sodium chloride, sodium chloride, albuterol, diphenhydrAMINE, food thickener, hydrocortisone cream, loperamide, pneumococcal 23 valent vaccine, RESOURCE THICKENUP CLEAR, sodium chloride flush, sodium chloride flush   Vital Signs    Vitals:   11/20/17 0206 11/20/17 0307 11/20/17 0811 11/20/17 0821  BP:  126/78 117/72   Pulse:    97  Resp:  15    Temp:  98.6 F (37 C) 97.6 F (36.4 C)   TempSrc:  Oral Oral   SpO2:  99%  96%  Weight: 120 lb 12.8 oz (54.8 kg)     Height:        Intake/Output Summary (Last 24 hours) at 11/20/2017 0855 Last data filed at 11/20/2017 0800 Gross per 24 hour  Intake 1427.17 ml  Output 2000 ml  Net -572.83 ml   Filed Weights   11/18/17 0427 11/19/17 0415 11/20/17 0206  Weight: 143 lb 4.8 oz (65 kg) 136 lb 11 oz (62 kg) 120 lb 12.8 oz (54.8 kg)    Telemetry    NSR - Personally Reviewed  ECG    Admitting EKG demonstrated diffuse ST segment depression on 11/16/2017.  A repeat tracing on 11/17/2017 reveals sinus tachycardia with diffuse ST T abnormalities although not as severe on  11/16/17.- Personally Reviewed  Physical Exam  Lying flat, no distress, GEN: No acute distress.   Neck: No JVD Cardiac: RRR, no murmurs, rubs, or gallops.  Respiratory: Clear to auscultation bilaterally. GI: Soft, nontender, non-distended  MS: No edema; No deformity. Neuro:  Flaccid right arm.  Weak right lower extremity.  Speech is near normal. Psych: Normal affect   Labs    Chemistry Recent Labs  Lab 11/17/17 0508 11/18/17 0350 11/19/17 0427 11/20/17 0421  NA 142 141 139 138  K 4.0 3.2* 4.1 4.3  CL 108 101 101 101  CO2 24 28 27 25   GLUCOSE 113* 81 80 95  BUN 20 20 24* 23*  CREATININE 0.63 0.64 0.73 0.80  CALCIUM 9.0 9.0 9.3 9.3  PROT 6.2*  --  7.2  --   ALBUMIN 3.3*  --  3.4*  --   AST 64*  --  31  --   ALT 26  --  19  --   ALKPHOS 63  --  65  --   BILITOT 0.5  --  0.8  --   GFRNONAA >60 >60 >60 >60  GFRAA >60 >60 >60 >60  ANIONGAP 10 12 11 12      Hematology  Recent Labs  Lab 11/17/17 0508 11/19/17 0427 11/20/17 0421  WBC 11.8* 9.2 9.8  RBC 3.91 4.63 4.65  HGB 11.4* 13.6 13.8  HCT 36.5 43.6 43.3  MCV 93.4 94.2 93.1  MCH 29.2 29.4 29.7  MCHC 31.2 31.2 31.9  RDW 14.3 14.1 14.0  PLT 335 373 364    Cardiac Enzymes Recent Labs  Lab 11/16/17 0528 11/16/17 0911 11/16/17 1509 11/16/17 2004  TROPONINI 0.41* 3.02* 10.50* 6.71*   No results for input(s): TROPIPOC in the last 168 hours.   BNP Recent Labs  Lab 11/16/17 0528 11/18/17 0350  BNP 675.0* 1,276.7*     DDimer  Recent Labs  Lab 11/16/17 0528  DDIMER 1.08*     Radiology    Dg Swallowing Func-speech Pathology  Result Date: 11/19/2017 Objective Swallowing Evaluation: Type of Study: MBS-Modified Barium Swallow Study  Patient Details Name: [name] MRN: [id number] Date of Birth: 1961/01/29 Today's Date: 11/19/2017 Time: SLP Start Time (ACUTE ONLY): 1050 -SLP Stop Time (ACUTE ONLY): 1117 SLP Time Calculation (min) (ACUTE ONLY): 27 min Past Medical History: Past Medical History:  Diagnosis Date . Arthritis   "knees" (11/16/2017) . Hay fever  . Pneumonia 11/16/2017 . Stroke [facility]) 08/23/2017  Left MCA infarct status post TPA and thrombectomy Past Surgical History: Past Surgical History: Procedure Laterality Date . CRYOABLATION    cervical . IR ANGIO INTRA EXTRACRAN SEL COM CAROTID INNOMINATE UNI R MOD SED  08/21/2017 . IR ANGIO VERTEBRAL SEL SUBCLAVIAN INNOMINATE UNI R MOD SED  08/21/2017 . IR PERCUTANEOUS ART THROMBECTOMY/INFUSION INTRACRANIAL INC DIAG ANGIO  08/21/2017 . IR RADIOLOGIST EVAL & MGMT  10/30/2017 . LOOP RECORDER INSERTION N/A 08/28/2017  Procedure: LOOP RECORDER INSERTION;  Surgeon: Constance Haw, MD;  Location: [place] [facility];  Service: Cardiovascular;  Laterality: N/A; . RADIOLOGY WITH ANESTHESIA N/A 08/21/2017  Procedure: RADIOLOGY WITH ANESTHESIA;  Surgeon: Luanne Bras, MD;  Location: [place];  Service: Radiology;  Laterality: N/A; . TEE WITHOUT CARDIOVERSION N/A 08/28/2017  Procedure: TRANSESOPHAGEAL ECHOCARDIOGRAM (TEE);  Surgeon: Fay Records, MD;  [facility] ENDOSCOPY;  Service: Cardiovascular;  Laterality: N/A; . TUBAL LIGATION   HPI: Kaitlyn G Griffinis a 57 y.o.femalewith medical history significant ofsignificant stroke in 07/2017. She was in inpatient rehab until December 20, after which she returned home. She presented to ED with SOB where she was noted to be significantly hypoxic. She was placed on high flow nasal cannula but subsequently required BiPAP therapy. Imaging indicates bilateral lower lobe pneumonia, suspicious for aspiration. Pt had MBS 08/25/18 showing moderate oral and mild pharyngeal dysphagia mostly characterized by decreased oral transiting coordination resulting premature spillage of boluses into pharynx; dys 3 with thin liquids and chin tuck was recommended. Progressed to regular textures, thin liquids in CIR and was drinking with head neutral position with no overt s/sx of aspiration.  Subjective: Pt reports she is thirsty  Assessment / Plan / Recommendation CHL IP CLINICAL IMPRESSIONS 11/19/2017 Clinical Impression Patient presents with mild oropharyngeal dysphagia; no aspiration observed on this examination. Oral stage notable for premature spillage of larger boluses of thin liquids, as well as delayed oral transit with mixed consistency (barium tablet and thin liquid). Pt took a large sip of liquid with barium tablet and tilted into neck extension in an apparent effort to aid transit. Swallow initiation was delayed with thin liquids to the pyriform sinuses. Trace, reversible penetration resulted due to delayed closure of the laryngeal vestibule. Pharyngeal stage characterized by adequate tongue base retraction, hyolargyneal excursion and  pharyngeal constriction. An esophageal sweep revealed slow clearance of contrast and barium tablet through the distal esophagus, tablet was not observed to pass through GE junction despite subsequent swallows of puree, thin liquids. Recommend regular diet with thin liquids, chin tuck is not needed for airway protection. Single, smaller sips do improve oral control and minimize risk for airway compromise. Pt told SLP she typically takes "4 or 5" pills at a time and "throws" her head back. SLP educated re: taking pills one a time with small sips of liquid or whole in puree to aid transit, follow with sips of water to aid clearance; consider breaking larger pills into smaller pieces. Will follow for tolerance and education, briefly. SLP Visit Diagnosis Dysphagia, oropharyngeal phase (R13.12) Attention and concentration deficit following -- Frontal lobe and executive function deficit following -- Impact on safety and function Mild aspiration risk   CHL IP TREATMENT RECOMMENDATION 11/19/2017 Treatment Recommendations Therapy as outlined in treatment plan below   Prognosis 11/19/2017 Prognosis for Safe Diet Advancement Good Barriers to Reach Goals Cognitive deficits Barriers/Prognosis Comment -- CHL IP DIET  RECOMMENDATION 11/19/2017 SLP Diet Recommendations Regular solids;Thin liquid Liquid Administration via Cup;Straw Medication Administration Whole meds with liquid Compensations Slow rate;Small sips/bites;Minimize environmental distractions Postural Changes --   CHL IP OTHER RECOMMENDATIONS 11/18/2017 Recommended Consults -- Oral Care Recommendations -- Other Recommendations Order thickener from pharmacy;Prohibited food (jello, ice cream, thin soups);Remove water pitcher   CHL IP FOLLOW UP RECOMMENDATIONS 11/19/2017 Follow up Recommendations Other (comment)   CHL IP FREQUENCY AND DURATION 11/19/2017 Speech Therapy Frequency (ACUTE ONLY) min 1 x/week Treatment Duration 1 week      CHL IP ORAL PHASE 11/19/2017 Oral Phase Impaired Oral - Pudding Teaspoon -- Oral - Pudding Cup -- Oral - Honey Teaspoon -- Oral - Honey Cup -- Oral - Nectar Teaspoon -- Oral - Nectar Cup -- Oral - Nectar Straw -- Oral - Thin Teaspoon -- Oral - Thin Cup Premature spillage Oral - Thin Straw Premature spillage Oral - Puree -- Oral - Mech Soft -- Oral - Regular -- Oral - Multi-Consistency Premature spillage;Delayed oral transit Oral - Pill -- Oral Phase - Comment --  CHL IP PHARYNGEAL PHASE 11/19/2017 Pharyngeal Phase Impaired Pharyngeal- Pudding Teaspoon -- Pharyngeal -- Pharyngeal- Pudding Cup -- Pharyngeal -- Pharyngeal- Honey Teaspoon -- Pharyngeal -- Pharyngeal- Honey Cup -- Pharyngeal -- Pharyngeal- Nectar Teaspoon -- Pharyngeal -- Pharyngeal- Nectar Cup -- Pharyngeal -- Pharyngeal- Nectar Straw -- Pharyngeal -- Pharyngeal- Thin Teaspoon -- Pharyngeal -- Pharyngeal- Thin Cup Delayed swallow initiation-pyriform sinuses;Penetration/Aspiration before swallow Pharyngeal Material enters airway, remains ABOVE vocal cords then ejected out;Material does not enter airway Pharyngeal- Thin Straw Delayed swallow initiation-pyriform sinuses;Penetration/Aspiration before swallow Pharyngeal Material does not enter airway;Material enters airway, remains ABOVE  vocal cords then ejected out Pharyngeal- Puree WFL Pharyngeal -- Pharyngeal- Mechanical Soft -- Pharyngeal -- Pharyngeal- Regular WFL Pharyngeal -- Pharyngeal- Multi-consistency -- Pharyngeal -- Pharyngeal- Pill -- Pharyngeal -- Pharyngeal Comment --  CHL IP CERVICAL ESOPHAGEAL PHASE 11/19/2017 Cervical Esophageal Phase WFL Pudding Teaspoon -- Pudding Cup -- Honey Teaspoon -- Honey Cup -- Nectar Teaspoon -- Nectar Cup -- Nectar Straw -- Thin Teaspoon -- Thin Cup -- Thin Straw -- Puree -- Mechanical Soft -- Regular -- Multi-consistency -- Pill -- Cervical Esophageal Comment -- No flowsheet data found. Aliene Altes 11/19/2017, 11:42 AM Deneise Lever, MS, CCC-SLP Speech-Language Pathologist [phone]              Cardiac Studies   2D Doppler echocardiogram 11/16/17:  Study Conclusions   - Left ventricle: The cavity size was mildly dilated. Wall   thickness was increased in a pattern of mild LVH. Systolic   function was moderately reduced. The estimated ejection fraction   was in the range of 35% to 40%. Diffuse hypokinesis. There is   akinesis of the basal-midinferolateral, inferior, and   inferoseptal myocardium. Features are consistent with a   pseudonormal left ventricular filling pattern, with concomitant   abnormal relaxation and increased filling pressure (grade 2   diastolic dysfunction). - Mitral valve: Mildly thickened leaflets . There was mild to   moderate regurgitation. - Left atrium: The atrium was mildly dilated. - Right atrium: Central venous pressure (est): 8 mm Hg. - Atrial septum: No defect or patent foramen ovale was identified. - Tricuspid valve: There was trivial regurgitation. - Pulmonary arteries: Systolic pressure could not be accurately   estimated. - Pericardium, extracardiac: There was no pericardial effusion.   Patient Profile     57 y.o. female with a history of left MCA stroke in November 2018, tobacco abuse, presenting with shortness of breath and possible  aspiration pneumonia as well as NSTEMI and possible ischemic cardiomyopathy, LVEF 35-40%.    Assessment & Plan    1.  Non-ST elevation myocardial infarction, acute on admission.  Etiology is uncertain.  Rule out CAD versus stress cardiomyopathy. 2.  Heart failure with reduced ejection fraction likely ischemic in origin.  Further evaluation for coronary disease is mandated. 3.  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 4.  Probable bilateral aspiration pneumonia, currently on therapy.  Scheduled to undergo left and right heart catheterization with coronary angiography to help further characterize etiology of decreased EF and elevated cardiac markers.  The patient was counseled to undergo left heart catheterization, coronary angiography, and possible percutaneous coronary intervention with stent implantation. The procedural risks and benefits were discussed in detail. The risks discussed included death, stroke, myocardial infarction, life-threatening bleeding, limb ischemia, kidney injury, allergy, and possible emergency cardiac surgery. The risk of these significant complications were estimated to occur less than 1% of the time. After discussion, the patient has agreed to proceed.   For questions or updates, please contact [place] Please consult [email or web address] for contact info under Cardiology/STEMI.      Signed, Sinclair Grooms, MD  11/20/2017, 8:55 AM

## 2017-11-20 NOTE — Progress Notes (Signed)
SLP Cancellation Note  Patient Details Name: [name] MRN: [id number] DOB: 1961-01-13   Cancelled treatment:       Reason Eval/Treat Not Completed: Other (comment)  NPO for procedure.  Will f/u next date.   Juan Quam Laurice 11/20/2017, 12:10 PM

## 2017-11-21 ENCOUNTER — Inpatient Hospital Stay (HOSPITAL_COMMUNITY): Payer: BLUE CROSS/BLUE SHIELD

## 2017-11-21 ENCOUNTER — Telehealth: Payer: Self-pay | Admitting: Family Medicine

## 2017-11-21 ENCOUNTER — Encounter (HOSPITAL_COMMUNITY): Payer: Self-pay | Admitting: Interventional Cardiology

## 2017-11-21 DIAGNOSIS — I5023 Acute on chronic systolic (congestive) heart failure: Secondary | ICD-10-CM

## 2017-11-21 DIAGNOSIS — Z8673 Personal history of transient ischemic attack (TIA), and cerebral infarction without residual deficits: Secondary | ICD-10-CM

## 2017-11-21 DIAGNOSIS — I25110 Atherosclerotic heart disease of native coronary artery with unstable angina pectoris: Secondary | ICD-10-CM

## 2017-11-21 DIAGNOSIS — I214 Non-ST elevation (NSTEMI) myocardial infarction: Secondary | ICD-10-CM

## 2017-11-21 LAB — CBC
HCT: 39 % (ref 36.0–46.0)
HEMOGLOBIN: 12.3 g/dL (ref 12.0–15.0)
MCH: 29.2 pg (ref 26.0–34.0)
MCHC: 31.5 g/dL (ref 30.0–36.0)
MCV: 92.6 fL (ref 78.0–100.0)
Platelets: 344 10*3/uL (ref 150–400)
RBC: 4.21 MIL/uL (ref 3.87–5.11)
RDW: 14 % (ref 11.5–15.5)
WBC: 7.7 10*3/uL (ref 4.0–10.5)

## 2017-11-21 LAB — PULMONARY FUNCTION TEST
FEF 25-75 Pre: 1.05 L/sec
FEF2575-%PRED-PRE: 45 %
FEV1-%Pred-Pre: 48 %
FEV1-Pre: 1.17 L
FEV1FVC-%Pred-Pre: 96 %
FEV6-%Pred-Pre: 50 %
FEV6-PRE: 1.52 L
FEV6FVC-%PRED-PRE: 104 %
FVC-%PRED-PRE: 48 %
FVC-PRE: 1.52 L
PRE FEV6/FVC RATIO: 100 %
Pre FEV1/FVC ratio: 77 %

## 2017-11-21 LAB — CULTURE, BLOOD (ROUTINE X 2)
CULTURE: NO GROWTH
Culture: NO GROWTH
SPECIAL REQUESTS: ADEQUATE

## 2017-11-21 LAB — HEPARIN LEVEL (UNFRACTIONATED)
Heparin Unfractionated: 0.54 IU/mL (ref 0.30–0.70)
Heparin Unfractionated: 0.58 IU/mL (ref 0.30–0.70)

## 2017-11-21 MED FILL — Heparin Sodium (Porcine) 2 Unit/ML in Sodium Chloride 0.9%: INTRAMUSCULAR | Qty: 1000 | Status: AC

## 2017-11-21 NOTE — Progress Notes (Signed)
PROGRESS NOTE   [name]  GXQ:[id number]    DOB: 1961-08-29    DOA: 11/16/2017  PCP: [name], DO   I have briefly reviewed patients previous medical [facility] in [facility].  Brief Narrative:  57 year old female with PMH of stroke with residual right hemiparesis and 07/2017, completed rehab in December and returned home, presented initially to [facility] with few days history of cough without chest pain and then became suddenly short of breath on night prior to admission.  In ED, significantly hypoxic, required BiPAP, CTA chest negative for PE but showed bibasilar pneumonia suspicious for aspiration.  Course complicated by elevated troponin, echo with low EF, Cardiology evaluated for NSTEMI and recommended transfer to [facility] for eventual cardiac catheterization.  Intermittently requiring BiPAP.  Cardiology following.  Improving.  Diet advanced to regular and thin liquids.  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 TCTS consulted by Cardiology and their input pending.   Assessment & Plan:   Active Problems:   Right hemiplegia ([place])   Hypokalemia   Former smoker   Acute respiratory failure with hypoxia (HCC)   Aspiration pneumonia (HCC)   Elevated troponin   NSTEMI (non-ST elevated myocardial infarction) ([place])   Acute on chronic systolic heart failure ([place])   1. Aspiration pneumonia: Likely from dysphagia due to recent stroke.  CTA chest 2/21: No PE but showed dependent infiltrates in both lower lobes.  Treated empirically with IV Unasyn.  Blood cultures x2: Negative, final report.  Improved. Transitioned to oral Augmentin 2/25 to complete total 7 days treatment.  Follow-up chest x-ray in 4 weeks to ensure resolution of pneumonia findings. 2. Dysphagia: Suspected due to recent acute stroke.  Speech therapy evaluated multiple times with final recommendations of regular diet and thin liquids. 3. NSTEMI/severe multivessel  CAD: No chest pain reported.  Troponin peaked to 10.5.  Continue IV heparin drip.  Continue aspirin, statin.  Allergic to beta blockers.  Cardizem discontinued due to low EF.  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 Cards consulted TCTS for bypass evaluation.  As per cardiology follow-up, patient has significant comorbidities and will be at high risk for definitive revascularization. 4. Cardiomyopathy: Cardiology following.  Started lisinopril. 5. Acute systolic CHF: TTE 1/44/81: LVEF 35-40%, diffuse hypokinesis and grade 2 diastolic dysfunction.  IV Lasix increased to 40 mg twice daily.  ACEI started.  Improving.  - 1.9 L since admission. BNP 1277 on 2/23.  Improved and euvolemic.  Last dose of IV Lasix was on 11/19/17. 6. Acute respiratory failure with hypoxia: Due to aspiration pneumonia and acute CHF.  No PE by CT.  PRN BiPAP, oxygen.  Able to wean down to 2 L [place] oxygen.  Continue to wean to room air as tolerated for saturation >92%. 7. Normocytic anemia: Likely lab error.  Hemoglobin 13.6 on 2/24. 8. Hypokalemia: Replaced.  Magnesium 1.9.  9. History of stroke with right hemiparesis: Continue aspirin and statins. 10. Diarrhea: Possibly related to antibiotics.  Low index of suspicion for C. difficile.  Improved after Imodium.  Monitor.   DVT prophylaxis: Currently on IV heparin drip. Code Status: Full. Family Communication: None at bedside today. Disposition: To be determined pending clinical improvement and further hospital evaluation.   Consultants:  Cardiology TCTS-pending.  Procedures:  BiPAP  Antimicrobials:  Unasyn- DC'ed PO Augmentin 2/25>   Subjective: Denies complaints.  No chest pain, dyspnea or cough reported.  Stated that she was off oxygen yesterday but was replaced again overnight.  Itchy skin rash on hip/buttocks has improved.  Diarrhea also is better.  She was interviewed and examined with her  female RN's in room.  ROS: As above.  Objective:  Vitals:   11/21/17 0534 11/21/17 0800 11/21/17 0835 11/21/17 0858  BP: 115/73   129/77  Pulse: (!) 47 93  (!) 101  Resp: 13 14  20   Temp: (!) 97.5 F (36.4 C)     TempSrc: Oral     SpO2: 97% 92% 96% 92%  Weight: 56.8 kg (125 lb 3.5 oz)     Height:        Examination:  General exam: Pleasant middle-aged female, moderately built and nourished lying comfortably propped up in bed.  Does not appear in any distress. Respiratory system: Clear to auscultation.  No increased work of breathing.  Stable without change. Cardiovascular system: S1 & S2 heard, RRR. No JVD, murmurs, rubs, gallops or clicks. No pedal edema.  Telemetry personally reviewed: Sinus rhythm. Gastrointestinal system: Abdomen is nondistended, soft and nontender. No organomegaly or masses felt. Normal bowel sounds heard.  Stable without change. Central nervous system: Alert and oriented. No focal neurological deficits.  Stable without change. Extremities: Left limbs grade 5 x 5 power.  Right upper extremity get 0 x 5 power.  Right lower extremity grade 4 x 5 power. Skin: Patchy areas of pruritic hives on both hips and upper thighs (on 2/25) Psychiatry: Judgement and insight appear normal. Mood & affect appropriate.     Data Reviewed: I have personally reviewed following labs and imaging studies  CBC: Recent Labs  Lab 11/16/17 0528 11/17/17 0508 11/19/17 0427 11/20/17 0421 11/21/17 0337  WBC 10.7* 11.8* 9.2 9.8 7.7  NEUTROABS 8.2*  --   --   --   --   HGB 12.8 11.4* 13.6 13.8 12.3  HCT 40.8 36.5 43.6 43.3 39.0  MCV 94.2 93.4 94.2 93.1 92.6  PLT 367 335 373 364 161   Basic Metabolic Panel: Recent Labs  Lab 11/16/17 0528 11/16/17 0911 11/17/17 0508 11/18/17 0350 11/19/17 0427 11/20/17 0421  NA 139  --  142 141 139 138  K 3.2*  --  4.0 3.2* 4.1 4.3  CL 106  --  108 101 101 101  CO2 23  --  24 28 27 25   GLUCOSE 142*  --  113* 81 80 95  BUN 22*  --  20  20 24* 23*  CREATININE 0.65  --  0.63 0.64 0.73 0.80  CALCIUM 8.9  --  9.0 9.0 9.3 9.3  MG  --  1.9  --   --   --   --    Liver Function Tests: Recent Labs  Lab 11/17/17 0508 11/19/17 0427  AST 64* 31  ALT 26 19  ALKPHOS 63 65  BILITOT 0.5 0.8  PROT 6.2* 7.2  ALBUMIN 3.3* 3.4*   Coagulation Profile: Recent Labs  Lab 11/16/17 0528  INR 0.87   Cardiac Enzymes: Recent Labs  Lab 11/16/17 0528 11/16/17 0911 11/16/17 1509 11/16/17 2004  TROPONINI 0.41* 3.02* 10.50* 6.71*     Recent Results (from the past 240 hour(s))  Blood culture (routine x 2)     Status: None   Collection Time: 11/16/17  5:32 AM  Result Value Ref Range Status   Specimen Description BLOOD LEFT ARM  Final   Special Requests   Final  BOTTLES DRAWN AEROBIC AND ANAEROBIC Blood Culture adequate volume   Culture   Final    NO GROWTH 5 DAYS Performed at [facility], [address], [place], [place] [address]    Report Status 11/21/2017 FINAL  Final  Blood culture (routine x 2)     Status: None   Collection Time: 11/16/17  5:37 AM  Result Value Ref Range Status   Specimen Description BLOOD LEFT ARM  Final   Special Requests   Final    Blood Culture results may not be optimal due to an inadequate volume of blood received in culture bottles BOTTLES DRAWN AEROBIC ONLY   Culture   Final    NO GROWTH 5 DAYS Performed at [facility], [address] [place], [place] [address]    Report Status 11/21/2017 FINAL  Final         Radiology Studies: No results found.      Scheduled Meds: . amoxicillin-clavulanate  1 tablet Oral Q12H  . aspirin EC  325 mg Oral Daily  . baclofen  5 mg Oral TID  . escitalopram  10 mg Oral Daily  . famotidine  20 mg Oral BID  . guaiFENesin  1,200 mg Oral BID  . hydrOXYzine  25 mg Oral QHS  . ipratropium-albuterol  3 mL Nebulization BID  . lisinopril  5 mg Oral Daily  . rosuvastatin  20 mg Oral Daily  . sodium chloride flush  3 mL Intravenous Q12H  . sodium  chloride flush  3 mL Intravenous Q12H   Continuous Infusions: . sodium chloride    . sodium chloride    . heparin 1,250 Units/hr (11/20/17 2146)     LOS: 5 days     Vernell Leep, MD, FACP, [facility]. [facility] Pager [phone]  If 7PM-7AM, please contact night-coverage [email or web address] Password [facility] 11/21/2017, 11:35 AM

## 2017-11-21 NOTE — Progress Notes (Signed)
Progress Note  Patient Name: [name] Date of Encounter: 11/21/2017   Primary Cardiologist: [name]  Subjective   No femoral access site pain.  She has a vague understanding of findings from cath which was performed yesterday.  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  Recovery of leg function and speech.  Has difficulty with moving right arm.  Inpatient Medications    Scheduled Meds: . amoxicillin-clavulanate  1 tablet Oral Q12H  . aspirin EC  325 mg Oral Daily  . baclofen  5 mg Oral TID  . escitalopram  10 mg Oral Daily  . famotidine  20 mg Oral BID  . guaiFENesin  1,200 mg Oral BID  . hydrOXYzine  25 mg Oral QHS  . ipratropium-albuterol  3 mL Nebulization BID  . lisinopril  5 mg Oral Daily  . rosuvastatin  20 mg Oral Daily  . sodium chloride flush  3 mL Intravenous Q12H  . sodium chloride flush  3 mL Intravenous Q12H   Continuous Infusions: . sodium chloride    . sodium chloride    . heparin 1,250 Units/hr (11/20/17 2146)   PRN Meds: sodium chloride, sodium chloride, acetaminophen, albuterol, diphenhydrAMINE, food thickener, hydrocortisone cream, loperamide, ondansetron (ZOFRAN) IV, pneumococcal 23 valent vaccine, RESOURCE THICKENUP CLEAR, sodium chloride flush, sodium chloride flush   Vital Signs    Vitals:   11/21/17 0534 11/21/17 0800 11/21/17 0835 11/21/17 0858  BP: 115/73   129/77  Pulse: (!) 47 93  (!) 101  Resp: 13 14  20   Temp: (!) 97.5 F (36.4 C)     TempSrc: Oral     SpO2: 97% 92% 96% 92%  Weight: 125 lb 3.5 oz (56.8 kg)     Height:        Intake/Output Summary (Last 24 hours) at 11/21/2017 1006 Last data filed at 11/21/2017 0900 Gross per 24 hour  Intake 620.42 ml  Output 351 ml  Net 269.42 ml   Filed Weights   11/19/17 0415 11/20/17 0206 11/21/17 0534  Weight: 136 lb 11 oz (62 kg) 120 lb 12.8 oz (54.8 kg) 125 lb 3.5 oz (56.8 kg)    Telemetry    Normal sinus rhythm- Personally  Reviewed  ECG    Most recently performed on 11/17/17 demonstrating sinus tachycardia and diffuse ST depression.- Personally Reviewed  Physical Exam  Awake, alert. GEN: No acute distress.   Neck: No JVD Cardiac: RRR, no murmurs, rubs, or gallops.  No hematoma.  Mild ecchymosis and right femoral access site Respiratory: Clear to auscultation bilaterally. GI: Soft, nontender, non-distended  MS: No edema; No deformity. Neuro:  Nonfocal  Psych: Normal affect   Labs    Chemistry Recent Labs  Lab 11/17/17 0508 11/18/17 0350 11/19/17 0427 11/20/17 0421  NA 142 141 139 138  K 4.0 3.2* 4.1 4.3  CL 108 101 101 101  CO2 24 28 27 25   GLUCOSE 113* 81 80 95  BUN 20 20 24* 23*  CREATININE 0.63 0.64 0.73 0.80  CALCIUM 9.0 9.0 9.3 9.3  PROT 6.2*  --  7.2  --   ALBUMIN 3.3*  --  3.4*  --   AST 64*  --  31  --   ALT 26  --  19  --   ALKPHOS 63  --  65  --   BILITOT 0.5  --  0.8  --   GFRNONAA >60 >60 >60 >60  GFRAA >60 >60 >60 >60  ANIONGAP 10 12 11 12      Hematology Recent Labs  Lab 11/19/17 0427 11/20/17 0421 11/21/17 0337  WBC 9.2 9.8 7.7  RBC 4.63 4.65 4.21  HGB 13.6 13.8 12.3  HCT 43.6 43.3 39.0  MCV 94.2 93.1 92.6  MCH 29.4 29.7 29.2  MCHC 31.2 31.9 31.5  RDW 14.1 14.0 14.0  PLT 373 364 344    Cardiac Enzymes Recent Labs  Lab 11/16/17 0528 11/16/17 0911 11/16/17 1509 11/16/17 2004  TROPONINI 0.41* 3.02* 10.50* 6.71*   No results for input(s): TROPIPOC in the last 168 hours.   BNP Recent Labs  Lab 11/16/17 0528 11/18/17 0350  BNP 675.0* 1,276.7*     DDimer  Recent Labs  Lab 11/16/17 0528  DDIMER 1.08*     Radiology    Dg Swallowing Func-speech Pathology  Result Date: 11/19/2017 Objective Swallowing Evaluation: Type of Study: MBS-Modified Barium Swallow Study  Patient Details Name: [name] MRN: [id number] Date of Birth: Jan 25, 1961 Today's Date: 11/19/2017 Time: SLP Start Time (ACUTE ONLY): 1050 -SLP Stop Time (ACUTE ONLY): 1117 SLP Time  Calculation (min) (ACUTE ONLY): 27 min Past Medical History: Past Medical History: Diagnosis Date . Arthritis   "knees" (11/16/2017) . Hay fever  . Pneumonia 11/16/2017 . Stroke [facility]) 08/23/2017  Left MCA infarct status post TPA and thrombectomy Past Surgical History: Past Surgical History: Procedure Laterality Date . CRYOABLATION    cervical . IR ANGIO INTRA EXTRACRAN SEL COM CAROTID INNOMINATE UNI R MOD SED  08/21/2017 . IR ANGIO VERTEBRAL SEL SUBCLAVIAN INNOMINATE UNI R MOD SED  08/21/2017 . IR PERCUTANEOUS ART THROMBECTOMY/INFUSION INTRACRANIAL INC DIAG ANGIO  08/21/2017 . IR RADIOLOGIST EVAL & MGMT  10/30/2017 . LOOP RECORDER INSERTION N/A 08/28/2017  Procedure: LOOP RECORDER INSERTION;  Surgeon: Constance Haw, MD;  Location: [place] CV LAB;  Service: Cardiovascular;  Laterality: N/A; . RADIOLOGY WITH ANESTHESIA N/A 08/21/2017  Procedure: RADIOLOGY WITH ANESTHESIA;  Surgeon: Luanne Bras, MD;  Location: [place];  Service: Radiology;  Laterality: N/A; . TEE WITHOUT CARDIOVERSION N/A 08/28/2017  Procedure: TRANSESOPHAGEAL ECHOCARDIOGRAM (TEE);  Surgeon: Fay Records, MD;  Location: [facility] ENDOSCOPY;  Service: Cardiovascular;  Laterality: N/A; . TUBAL LIGATION   HPI: Marcene G Griffinis a 57 y.o.femalewith medical history significant ofsignificant stroke in 07/2017. She was in inpatient rehab until December 20, after which she returned home. She presented to ED with SOB where she was noted to be significantly hypoxic. She was placed on high flow nasal cannula but subsequently required BiPAP therapy. Imaging indicates bilateral lower lobe pneumonia, suspicious for aspiration. Pt had MBS 08/25/18 showing moderate oral and mild pharyngeal dysphagia mostly characterized by decreased oral transiting coordination resulting premature spillage of boluses into pharynx; dys 3 with thin liquids and chin tuck was recommended. Progressed to regular textures, thin liquids in CIR and was drinking with head neutral  position with no overt s/sx of aspiration.  Subjective: Pt reports she is thirsty Assessment / Plan / Recommendation CHL IP CLINICAL IMPRESSIONS 11/19/2017 Clinical Impression Patient presents with mild oropharyngeal dysphagia; no aspiration observed on this examination. Oral stage notable for premature spillage of larger boluses of thin liquids, as well as delayed oral transit with mixed consistency (barium tablet and thin liquid). Pt took a large sip of liquid with barium tablet and tilted into neck extension in an apparent effort to aid transit. Swallow initiation was delayed with thin liquids to the pyriform sinuses. Trace, reversible penetration resulted due to delayed closure of the laryngeal vestibule. Pharyngeal  stage characterized by adequate tongue base retraction, hyolargyneal excursion and pharyngeal constriction. An esophageal sweep revealed slow clearance of contrast and barium tablet through the distal esophagus, tablet was not observed to pass through GE junction despite subsequent swallows of puree, thin liquids. Recommend regular diet with thin liquids, chin tuck is not needed for airway protection. Single, smaller sips do improve oral control and minimize risk for airway compromise. Pt told SLP she typically takes "4 or 5" pills at a time and "throws" her head back. SLP educated re: taking pills one a time with small sips of liquid or whole in puree to aid transit, follow with sips of water to aid clearance; consider breaking larger pills into smaller pieces. Will follow for tolerance and education, briefly. SLP Visit Diagnosis Dysphagia, oropharyngeal phase (R13.12) Attention and concentration deficit following -- Frontal lobe and executive function deficit following -- Impact on safety and function Mild aspiration risk   CHL IP TREATMENT RECOMMENDATION 11/19/2017 Treatment Recommendations Therapy as outlined in treatment plan below   Prognosis 11/19/2017 Prognosis for Safe Diet Advancement Good  Barriers to Reach Goals Cognitive deficits Barriers/Prognosis Comment -- CHL IP DIET RECOMMENDATION 11/19/2017 SLP Diet Recommendations Regular solids;Thin liquid Liquid Administration via Cup;Straw Medication Administration Whole meds with liquid Compensations Slow rate;Small sips/bites;Minimize environmental distractions Postural Changes --   CHL IP OTHER RECOMMENDATIONS 11/18/2017 Recommended Consults -- Oral Care Recommendations -- Other Recommendations Order thickener from pharmacy;Prohibited food (jello, ice cream, thin soups);Remove water pitcher   CHL IP FOLLOW UP RECOMMENDATIONS 11/19/2017 Follow up Recommendations Other (comment)   CHL IP FREQUENCY AND DURATION 11/19/2017 Speech Therapy Frequency (ACUTE ONLY) min 1 x/week Treatment Duration 1 week      CHL IP ORAL PHASE 11/19/2017 Oral Phase Impaired Oral - Pudding Teaspoon -- Oral - Pudding Cup -- Oral - Honey Teaspoon -- Oral - Honey Cup -- Oral - Nectar Teaspoon -- Oral - Nectar Cup -- Oral - Nectar Straw -- Oral - Thin Teaspoon -- Oral - Thin Cup Premature spillage Oral - Thin Straw Premature spillage Oral - Puree -- Oral - Mech Soft -- Oral - Regular -- Oral - Multi-Consistency Premature spillage;Delayed oral transit Oral - Pill -- Oral Phase - Comment --  CHL IP PHARYNGEAL PHASE 11/19/2017 Pharyngeal Phase Impaired Pharyngeal- Pudding Teaspoon -- Pharyngeal -- Pharyngeal- Pudding Cup -- Pharyngeal -- Pharyngeal- Honey Teaspoon -- Pharyngeal -- Pharyngeal- Honey Cup -- Pharyngeal -- Pharyngeal- Nectar Teaspoon -- Pharyngeal -- Pharyngeal- Nectar Cup -- Pharyngeal -- Pharyngeal- Nectar Straw -- Pharyngeal -- Pharyngeal- Thin Teaspoon -- Pharyngeal -- Pharyngeal- Thin Cup Delayed swallow initiation-pyriform sinuses;Penetration/Aspiration before swallow Pharyngeal Material enters airway, remains ABOVE vocal cords then ejected out;Material does not enter airway Pharyngeal- Thin Straw Delayed swallow initiation-pyriform sinuses;Penetration/Aspiration before  swallow Pharyngeal Material does not enter airway;Material enters airway, remains ABOVE vocal cords then ejected out Pharyngeal- Puree WFL Pharyngeal -- Pharyngeal- Mechanical Soft -- Pharyngeal -- Pharyngeal- Regular WFL Pharyngeal -- Pharyngeal- Multi-consistency -- Pharyngeal -- Pharyngeal- Pill -- Pharyngeal -- Pharyngeal Comment --  CHL IP CERVICAL ESOPHAGEAL PHASE 11/19/2017 Cervical Esophageal Phase WFL Pudding Teaspoon -- Pudding Cup -- Honey Teaspoon -- Honey Cup -- Nectar Teaspoon -- Nectar Cup -- Nectar Straw -- Thin Teaspoon -- Thin Cup -- Thin Straw -- Puree -- Mechanical Soft -- Regular -- Multi-consistency -- Pill -- Cervical Esophageal Comment -- No flowsheet data found. Aliene Altes 11/19/2017, 11:42 AM Deneise Lever, Ginger Blue, [place] Speech-Language Pathologist [phone]  Cardiac Studies   Echocardiogram 11/16/2017: Study Conclusions   - Left ventricle: The cavity size was mildly dilated. Wall   thickness was increased in a pattern of mild LVH. Systolic   function was moderately reduced. The estimated ejection fraction   was in the range of 35% to 40%. Diffuse hypokinesis. There is   akinesis of the basal-midinferolateral, inferior, and   inferoseptal myocardium. Features are consistent with a   pseudonormal left ventricular filling pattern, with concomitant   abnormal relaxation and increased filling pressure (grade 2   diastolic dysfunction). - Mitral valve: Mildly thickened leaflets . There was mild to   moderate regurgitation. - Left atrium: The atrium was mildly dilated. - Right atrium: Central venous pressure (est): 8 mm Hg. - Atrial septum: No defect or patent foramen ovale was identified. - Tricuspid valve: There was trivial regurgitation. - Pulmonary arteries: Systolic pressure could not be accurately   estimated. - Pericardium, extracardiac: There was no pericardial effusion.  Cardiac catheterization 11/20/2017: Diagnostic Diagram      Severe left  main disease.    Severely decreased LV function.  Normal LVEDP 8 mmHg and mean wedge pressure is 3 mmHg.    Patient Profile     57 y.o. 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  Coronary angiography demonstrated nondominant RCA with severe distal left main and moderate to moderately severe proximal circumflex and mid LAD disease.  Total occlusion of mid to distal circumflex.   Assessment & Plan    1.  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 TCTS consult for surgical revascularization opinion has been rendered. 2.  Acute on chronic systolic heart failure with suspected component of hibernating myocardium given normal LV hemodynamics, normal anterior and lateral wall thickness on echo, and preservation of R wave forces (absence of Q waves) on ECG anterior leads. 3.  Left brain CVA treated with TPA and thrombectomy November 2018 with residual right arm paralysis. 4.  Prior smoker.  Patient has significant comorbidities and will be at high risk for definitive revascularization.  Await surgical opinion.  Doubt the viability imaging will be helpful.    For questions or updates, please contact [place] Please consult [email or web address] for contact info under Cardiology/STEMI.      Signed, Sinclair Grooms, MD  11/21/2017, 10:06 AM

## 2017-11-21 NOTE — Progress Notes (Signed)
ANTICOAGULATION CONSULT NOTE - Follow Up Consult  Pharmacy Consult for Heparin Indication: ACS/NSTEMI, multi-vessel CAD  Allergies  Allergen Reactions  . Lopressor [Metoprolol Tartrate]     Angioedema  . Lipitor [Atorvastatin] Diarrhea    Patient Measurements: Height: 5\' 2"  (157.5 cm) Weight: 125 lb 3.5 oz (56.8 kg) IBW/kg (Calculated) : 50.1 Heparin Dosing Weight: 57 kg  Vital Signs: Temp: 97.5 F (36.4 C) (02/26 0534) Temp Source: Oral (02/26 0534) BP: 129/77 (02/26 0858) Pulse Rate: 101 (02/26 0858)  Labs: Recent Labs    11/19/17 0427  11/20/17 0421 11/21/17 0337 11/21/17 0834  HGB 13.6  --  13.8 12.3  --   HCT 43.6  --  43.3 39.0  --   PLT 373  --  364 344  --   HEPARINUNFRC 0.29*   < > 0.52 0.54 0.58  CREATININE 0.73  --  0.80  --   --    < > = values in this interval not displayed.    Estimated Creatinine Clearance: 61.4 mL/min (by C-G formula based on SCr of 0.8 mg/dL).  Assessment:   57 yr old female continues on IV heparin for ACS/NSTEMI. S/p cardiac cath 2/25 which showed multi-vessel CAD.  TCTS consulted.    Heparin level remains therapeutic (0.58) on 1250 units/hr.  Goal of Therapy:  Heparin level 0.3-0.7 units/ml Monitor platelets by anticoagulation protocol: Yes   Plan:   Continue heparin drip at 1250 units/hr.  Daily heparin level and CBC while on heparin.  Follow up TCTS consult  Arty Baumgartner, [place] Pager: [phone] or [phone] 11/21/2017,11:36 AM

## 2017-11-21 NOTE — Progress Notes (Signed)
[place] for Heparin Indication: ACS/STEMI  Allergies  Allergen Reactions  . Lopressor [Metoprolol Tartrate]     Angioedema  . Lipitor [Atorvastatin] Diarrhea   Patient Measurements: Height: 5\' 2"  (157.5 cm) Weight: 120 lb 12.8 oz (54.8 kg) IBW/kg (Calculated) : 50.1 HEPARIN DW (KG): 56.7  Vital Signs: Temp: 97.4 F (36.3 C) (02/26 0009) Temp Source: Oral (02/26 0009) BP: 105/61 (02/26 0008) Pulse Rate: 92 (02/26 0008)  Labs: Recent Labs    11/19/17 0427 11/19/17 1638 11/20/17 0421 11/21/17 0337  HGB 13.6  --  13.8 12.3  HCT 43.6  --  43.3 39.0  PLT 373  --  364 344  HEPARINUNFRC 0.29* 0.54 0.52 0.54  CREATININE 0.73  --  0.80  --    Estimated Creatinine Clearance: 61.4 mL/min (by C-G formula based on SCr of 0.8 mg/dL).  Assessment: 57 yo female with NSTEMI. S/p cath, heparin was restarted after sheath removal, pending CVTS consult.    Heparin level is therapeutic. CBC wnl.  Goal of Therapy:  Heparin level goal 0.3-0.7 unit/ml Monitor platelets by anticoagulation protocol: Yes   Plan:  Continue heparin at 1250 units / hr Daily HL, CBC F/u CVTS plans  Harvel Quale 11/21/2017 4:35 AM

## 2017-11-21 NOTE — Consult Note (Signed)
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 Referring Physician: Dr. Casandra Doffing  HALEEMAH BUCKALEW is an 57 y.o. female.  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  She was treated with TPA and thrombectomy and went to inpatient rehab until September 14, 2017.  She made some improvement with return of some function in her right lower extremity but very little function in the right upper extremity.  She went back home with her husband after inpatient rehab.  She had a loop recorder placed and has not had any documented atrial fibrillation.  She was admitted on 11/16/2017 with new onset shortness of breath which she says was associated with some pains across her lower abdomen.  She came to the emergency room and was noted to be hypoxemic and was started on oxygen therapy but required BiPAP.  She was diagnosed with bilateral lower lobe pneumonia suspicious for aspiration.  Her troponin was mildly elevated initially at 0.4.  Echocardiogram showed some ST depressions in the anterolateral leads.  Her troponin rose to a peak of 10.5.  She had an echocardiogram showing moderate left ventricular systolic dysfunction with ejection fraction of 35-40%.  There is diffuse hypokinesis with akinesis in the basal-mid inferior lateral, inferior, and inferoseptal myocardium.  There is grade 2 diastolic dysfunction.  There was mild to moderate mitral regurgitation.  Cardiac catheterization yesterday showed 75% mid to distal left main stenosis with significant calcification.  There was 50% ostial to proximal left circumflex stenosis.  There is 40% ostial LAD to proximal LAD stenosis.  The mid left circumflex was occluded  after the first marginal branch.  There was faint collaterals to the distal vessel which appeared fairly small.  The right coronary artery was a small nondominant vessel with 90% proximal stenosis.  Left ventricular ejection fraction was estimated visually to be 25-35%.  Right heart catheterization showed a cardiac index of 2.29 with a PA sat of 62%.  Right heart pressures were normal.  LVEDP was normal.   Past Medical History:  Diagnosis Date  . Arthritis    "knees" (11/16/2017)  . Hay fever   . Pneumonia 11/16/2017  . Stroke [facility]) 08/23/2017   Left MCA infarct status post TPA and thrombectomy    Past Surgical History:  Procedure Laterality Date  . CRYOABLATION     cervical  . IR ANGIO INTRA EXTRACRAN SEL COM CAROTID INNOMINATE UNI R MOD SED  08/21/2017  . IR ANGIO VERTEBRAL SEL SUBCLAVIAN INNOMINATE UNI R MOD SED  08/21/2017  . IR PERCUTANEOUS ART THROMBECTOMY/INFUSION INTRACRANIAL INC DIAG ANGIO  08/21/2017  . IR RADIOLOGIST EVAL & MGMT  10/30/2017  . LOOP RECORDER INSERTION N/A 08/28/2017   Procedure: LOOP RECORDER INSERTION;  Surgeon: Constance Haw, MD;  Location: [facility];  Service: Cardiovascular;  Laterality: N/A;  . RADIOLOGY WITH ANESTHESIA N/A 08/21/2017   Procedure: RADIOLOGY WITH ANESTHESIA;  Surgeon: Luanne Bras, MD;  Location: [facility];  Service: Radiology;  Laterality: N/A;  . RIGHT/LEFT HEART CATH AND CORONARY ANGIOGRAPHY N/A 11/20/2017   Procedure: RIGHT/LEFT HEART CATH AND CORONARY ANGIOGRAPHY;  Surgeon: Larae Grooms  S, MD;  Location: [facility];  Service: Cardiovascular;  Laterality: N/A;  . TEE WITHOUT CARDIOVERSION N/A 08/28/2017   Procedure: TRANSESOPHAGEAL ECHOCARDIOGRAM (TEE);  Surgeon: Fay Records, MD;  Location: [facility] ENDOS[facility];  Service: Cardiovascular;  Laterality: N/A;  . TUBAL LIGATION      Family History  Problem Relation Age of Onset  . Heart attack Father   . Hypertension Mother   . Hyperlipidemia Mother   . Diabetes  type II Mother   . Hypertension Brother     Social History:  reports that she quit smoking about 3 months ago. Her smoking use included cigarettes. She has a 4.44 pack-year smoking history. she has never used smokeless tobacco. She reports that she does not drink alcohol or use drugs.  Allergies:  Allergies  Allergen Reactions  . Lopressor [Metoprolol Tartrate]     Angioedema  . Lipitor [Atorvastatin] Diarrhea    Medications:  I have reviewed the patient's current medications. Prior to Admission:  Medications Prior to Admission  Medication Sig Dispense Refill Last Dose  . aspirin 325 MG EC tablet Take 1 tablet (325 mg total) by mouth daily. 90 tablet 3 11/15/2017 at Unknown time  . baclofen (LIORESAL) 10 MG tablet Take 0.5 tablets (5 mg total) by mouth 3 (three) times daily. 45 tablet 1 11/15/2017 at Unknown time  . cetirizine (ZYRTEC) 10 MG tablet Take 10 mg by mouth daily.   11/15/2017 at Unknown time  . escitalopram (LEXAPRO) 10 MG tablet Take 1 tablet (10 mg total) by mouth daily. 90 tablet 1 11/15/2017 at Unknown time  . famotidine (PEPCID) 20 MG tablet Take 1 tablet (20 mg total) by mouth 2 (two) times daily. 180 tablet 3 11/15/2017 at Unknown time  . hydrOXYzine (ATARAX/VISTARIL) 25 MG tablet Take 1 tablet (25 mg total) by mouth at bedtime. 90 tablet 1 11/15/2017 at Unknown time  . rosuvastatin (CRESTOR) 20 MG tablet Take 1 tablet (20 mg total) by mouth daily. 90 tablet 3 11/15/2017 at Unknown time   Scheduled: . amoxicillin-clavulanate  1 tablet Oral Q12H  . aspirin EC  325 mg Oral Daily  . baclofen  5 mg Oral TID  . escitalopram  10 mg Oral Daily  . famotidine  20 mg Oral BID  . guaiFENesin  1,200 mg Oral BID  . hydrOXYzine  25 mg Oral QHS  . ipratropium-albuterol  3 mL Nebulization BID  . lisinopril  5 mg Oral Daily  . rosuvastatin  20 mg Oral Daily  . sodium chloride flush  3 mL Intravenous Q12H  . sodium chloride flush  3 mL Intravenous Q12H   Continuous: . sodium  chloride    . sodium chloride    . heparin 1,250 Units/hr (11/20/17 2146)   ZDG:UYQIHK chloride, sodium chloride, acetaminophen, albuterol, diphenhydrAMINE, food thickener, hydrocortisone cream, loperamide, ondansetron (ZOFRAN) IV, pneumococcal 23 valent vaccine, RESOURCE THICKENUP CLEAR, sodium chloride flush, sodium chloride flush Anti-infectives (From admission, onward)   Start     Dose/Rate Route Frequency Ordered Stop   11/20/17 1600  amoxicillin-clavulanate (AUGMENTIN) 875-125 MG per tablet 1 tablet     1 tablet Oral Every 12 hours 11/20/17 1531 11/23/17 2159   11/16/17 1030  Ampicillin-Sulbactam (UNASYN) 3 g in sodium chloride 0.9 % 100 mL IVPB  Status:  Discontinued     3 g 200 mL/hr over 30 Minutes Intravenous Every 6 hours 11/16/17 0908 11/20/17 1526   11/16/17 0545  cefTRIAXone (ROCEPHIN) 1 g in sodium chloride 0.9 % 100 mL  IVPB     1 g 200 mL/hr over 30 Minutes Intravenous  Once 11/16/17 0534 11/16/17 0623   11/16/17 0545  azithromycin (ZITHROMAX) 500 mg in sodium chloride 0.9 % 250 mL IVPB     500 mg 250 mL/hr over 60 Minutes Intravenous  Once 11/16/17 0534 11/16/17 0730      Results for orders placed or performed during the hospital encounter of 11/16/17 (from the past 48 hour(s))  Heparin level (unfractionated)     Status: None   Collection Time: 11/20/17  4:21 AM  Result Value Ref Range   Heparin Unfractionated 0.52 0.30 - 0.70 IU/mL    Comment:        IF HEPARIN RESULTS ARE BELOW EXPECTED VALUES, AND PATIENT DOSAGE HAS BEEN CONFIRMED, SUGGEST FOLLOW UP TESTING OF ANTITHROMBIN III LEVELS. Performed at [facility], [place] [address] [place] [address]   CBC     Status: None   Collection Time: 11/20/17  4:21 AM  Result Value Ref Range   WBC 9.8 4.0 - 10.5 K/uL   RBC 4.65 3.87 - 5.11 MIL/uL   Hemoglobin 13.8 12.0 - 15.0 g/dL   HCT 43.3 36.0 - 46.0 %   MCV 93.1 78.0 - 100.0 fL   MCH 29.7 26.0 - 34.0 pg   MCHC 31.9 30.0 - 36.0 g/dL   RDW 14.0 11.5 -  15.5 %   Platelets 364 150 - 400 K/uL    Comment: Performed at [facility] [address] [place], [place] [address]  Basic metabolic panel     Status: Abnormal   Collection Time: 11/20/17  4:21 AM  Result Value Ref Range   Sodium 138 135 - 145 mmol/L   Potassium 4.3 3.5 - 5.1 mmol/L   Chloride 101 101 - 111 mmol/L   CO2 25 22 - 32 mmol/L   Glucose, Bld 95 65 - 99 mg/dL   BUN 23 (H) 6 - 20 mg/dL   Creatinine, Ser 0.80 0.44 - 1.00 mg/dL   Calcium 9.3 8.9 - 10.3 mg/dL   GFR calc non Af Amer >60 >60 mL/min   GFR calc Af Amer >60 >60 mL/min    Comment: (NOTE) The eGFR has been calculated using the CKD EPI equation. This calculation has not been validated in all clinical situations. eGFR's persistently <60 mL/min signify possible Chronic Kidney Disease.    Anion gap 12 5 - 15    Comment: Performed at [facility] [address], [place], [place] [address]  I-STAT 3, arterial blood gas (G3+)     Status: Abnormal   Collection Time: 11/20/17  1:37 PM  Result Value Ref Range   pH, Arterial 7.346 (L) 7.350 - 7.450   pCO2 arterial 46.8 32.0 - 48.0 mmHg   pO2, Arterial 72.0 (L) 83.0 - 108.0 mmHg   Bicarbonate 25.6 20.0 - 28.0 mmol/L   TCO2 27 22 - 32 mmol/L   O2 Saturation 93.0 %   Acid-base deficit 1.0 0.0 - 2.0 mmol/L   Patient temperature HIDE    Sample type ARTERIAL   I-STAT 3, venous blood gas (G3P V)     Status: None   Collection Time: 11/20/17  1:46 PM  Result Value Ref Range   pH, Ven 7.344 7.250 - 7.430   pCO2, Ven 49.4 44.0 - 60.0 mmHg   pO2, Ven 34.0 32.0 - 45.0 mmHg   Bicarbonate 26.9 20.0 - 28.0 mmol/L   TCO2 28 22 - 32 mmol/L  O2 Saturation 62.0 %   Patient temperature HIDE    Sample type VENOUS    Comment NOTIFIED PHYSICIAN   POCT Activated clotting time     Status: None   Collection Time: 11/20/17  1:55 PM  Result Value Ref Range   Activated Clotting Time 131 seconds  Heparin level (unfractionated)     Status: None   Collection Time: 11/21/17   3:37 AM  Result Value Ref Range   Heparin Unfractionated 0.54 0.30 - 0.70 IU/mL    Comment:        IF HEPARIN RESULTS ARE BELOW EXPECTED VALUES, AND PATIENT DOSAGE HAS BEEN CONFIRMED, SUGGEST FOLLOW UP TESTING OF ANTITHROMBIN III LEVELS. Performed at [facility], [place] [address], [place] [address]   CBC     Status: None   Collection Time: 11/21/17  3:37 AM  Result Value Ref Range   WBC 7.7 4.0 - 10.5 K/uL   RBC 4.21 3.87 - 5.11 MIL/uL   Hemoglobin 12.3 12.0 - 15.0 g/dL   HCT 39.0 36.0 - 46.0 %   MCV 92.6 78.0 - 100.0 fL   MCH 29.2 26.0 - 34.0 pg   MCHC 31.5 30.0 - 36.0 g/dL   RDW 14.0 11.5 - 15.5 %   Platelets 344 150 - 400 K/uL    Comment: Performed at [facility], [place] [address] [place], [place] [address]  Heparin level (unfractionated)     Status: None   Collection Time: 11/21/17  8:34 AM  Result Value Ref Range   Heparin Unfractionated 0.58 0.30 - 0.70 IU/mL    Comment:        IF HEPARIN RESULTS ARE BELOW EXPECTED VALUES, AND PATIENT DOSAGE HAS BEEN CONFIRMED, SUGGEST FOLLOW UP TESTING OF ANTITHROMBIN III LEVELS. Performed at [facility], [place] [address], [place],  [address]     No results found.  Review of Systems  Constitutional: Positive for malaise/fatigue. Negative for chills, diaphoresis and fever.  HENT: Negative.   Eyes: Negative.   Respiratory: Positive for shortness of breath and wheezing.   Cardiovascular: Positive for orthopnea and leg swelling. Negative for chest pain, palpitations and PND.  Gastrointestinal: Positive for abdominal pain.  Genitourinary: Negative.   Musculoskeletal: Negative.   Skin: Negative.   Neurological: Positive for focal weakness and weakness.  Endo/Heme/Allergies: Negative.   Psychiatric/Behavioral: Negative.    Blood pressure (!) 117/96, pulse (!) 108, temperature 98.2 F (36.8 C), temperature source Oral, resp. rate 18, height 5' 2"  (1.575 m), weight 56.8 kg (125 lb 3.5 oz), SpO2 94  %. Physical Exam  Constitutional: She is oriented to person, place, and time. She appears well-developed and well-nourished. No distress.  HENT:  Head: Normocephalic and atraumatic.  Mouth/Throat: Oropharynx is clear and moist.  Eyes: EOM are normal. Pupils are equal, round, and reactive to light.  Neck: Normal range of motion. Neck supple. No JVD present. No thyromegaly present.  Cardiovascular: Normal rate, regular rhythm and normal heart sounds.  No murmur heard. Normal left femoral pulse. Diminished right femoral pulse.   Pedal pulses palpable on left, not on right.  Respiratory: Effort normal and breath sounds normal. No respiratory distress. She has no wheezes. She has no rales.  GI: Soft. Bowel sounds are normal. She exhibits no distension and no mass. There is no tenderness.  Musculoskeletal: Normal range of motion. She exhibits no edema.  Lymphadenopathy:    She has no cervical adenopathy.  Neurological: She is alert and oriented  to person, place, and time. No sensory deficit.  Minimal motor function in RUE.  3/5 strength in right leg.    [place], [place] [address]                            [phone]  ------------------------------------------------------------------- Transthoracic Echocardiography  Patient:    Brizza, Nathanson MR #:       [id number] Study Date: 11/16/2017 Gender:     F Age:        15 Height:     157.5 cm Weight:     56.7 kg BSA:        1.58 m^2 Pt. Status: Room:       APA05   Wendie Chess [id number]  [place], [place], [place] [id number]  PERFORMING   [facility], Forestine Na  SONOGRAPHER  Alvino Chapel, RCS  cc:  ------------------------------------------------------------------- LV EF: 35% -   40%  ------------------------------------------------------------------- Indications:      Elevated  Troponin.  ------------------------------------------------------------------- History:   PMH:  Acute respiratory failure with hypoxia NSTEMI (non-ST elevated myocardial infarction) Acquired from the patient and from the patient&'s chart.  PMH:  Cerebrovascular accident (CVA) due to occlusion of left middle cerebral artery  Risk factors: Former smoker. Dyslipidemia.  ------------------------------------------------------------------- Study Conclusions  - Left ventricle: The cavity size was mildly dilated. Wall   thickness was increased in a pattern of mild LVH. Systolic   function was moderately reduced. The estimated ejection fraction   was in the range of 35% to 40%. Diffuse hypokinesis. There is   akinesis of the basal-midinferolateral, inferior, and   inferoseptal myocardium. Features are consistent with a   pseudonormal left ventricular filling pattern, with concomitant   abnormal relaxation and increased filling pressure (grade 2   diastolic dysfunction). - Mitral valve: Mildly thickened leaflets . There was mild to   moderate regurgitation. - Left atrium: The atrium was mildly dilated. - Right atrium: Central venous pressure (est): 8 mm Hg. - Atrial septum: No defect or patent foramen ovale was identified. - Tricuspid valve: There was trivial regurgitation. - Pulmonary arteries: Systolic pressure could not be accurately   estimated. - Pericardium, extracardiac: There was no pericardial effusion.  ------------------------------------------------------------------- Study data:  Comparison was made to the study of 09/18/2017.  Study status:  Routine.  Procedure:  The patient reported no pain pre or post test. Transthoracic echocardiography. Image quality was adequate.  Study completion:  There were no complications. Transthoracic echocardiography.  M-mode, complete 2D, spectral Doppler, and color Doppler.  Birthdate:  Patient birthdate: 27-May-1961.  Age:  Patient is 57  yr old.  Sex:  Gender: female. BMI: 22.9 kg/m^2.  Blood pressure:     107/68  Patient status: Inpatient.  Study date:  Study date: 11/16/2017. Study time: 01:43 PM.  Location:  [facility].  -------------------------------------------------------------------  ------------------------------------------------------------------- Left ventricle:  The cavity size was mildly dilated. Wall thickness was increased in a pattern of mild LVH. Systolic function was moderately reduced. The estimated ejection fraction was in the range of 35% to 40%. Diffuse hypokinesis.  Regional wall motion abnormalities:   There is akinesis of the basal-midinferolateral, inferior, and inferoseptal myocardium. Features are consistent with a pseudonormal left ventricular filling pattern, with concomitant abnormal relaxation and increased filling pressure (grade 2 diastolic dysfunction).  ------------------------------------------------------------------- Aortic valve:   Trileaflet.  Doppler:  There was no significant regurgitation.  ------------------------------------------------------------------- Aorta:  Aortic root: The aortic root was normal in size.  ------------------------------------------------------------------- Mitral valve:   Mildly thickened leaflets .  Doppler:  There was mild to moderate regurgitation.    Peak gradient (D): 6 mm Hg.  ------------------------------------------------------------------- Left atrium:  The atrium was mildly dilated.  ------------------------------------------------------------------- Atrial septum:  No defect or patent foramen ovale was identified.   ------------------------------------------------------------------- Right ventricle:  The cavity size was normal. Systolic function was normal.  ------------------------------------------------------------------- Pulmonic valve:    The valve appears to be grossly normal. Doppler:  There was no significant  regurgitation.  ------------------------------------------------------------------- Tricuspid valve:   The valve appears to be grossly normal. Doppler:  There was trivial regurgitation.  ------------------------------------------------------------------- Pulmonary artery:    Systolic pressure could not be accurately estimated.  ------------------------------------------------------------------- Right atrium:  The atrium was normal in size.  ------------------------------------------------------------------- Pericardium:  There was no pericardial effusion.  ------------------------------------------------------------------- Systemic veins: Inferior vena cava: The vessel was mildly dilated. The respirophasic diameter changes were in the normal range (>= 50%).  ------------------------------------------------------------------- Measurements   Left ventricle                             Value        Reference  LV ID, ED, PLAX chordal            (H)     59.6  mm     43 - 52  LV ID, ES, PLAX chordal            (H)     47    mm     23 - 38  LV fx shortening, PLAX chordal     (L)     21    %      >=29  LV PW thickness, ED                        11.4  mm     ----------  IVS/LV PW ratio, ED                        1.03         <=1.3  Stroke volume, 2D                          50    ml     ----------  Stroke volume/bsa, 2D                      32    ml/m^2 ----------  LV ejection fraction, 1-p A4C              36    %      ----------  LV end-diastolic volume, 2-p               147   ml     ----------  LV end-systolic volume, 2-p                97    ml     ----------  LV  ejection fraction, 2-p                  34    %      ----------  Stroke volume, 2-p                         50    ml     ----------  LV end-diastolic volume/bsa, 2-p           93    ml/m^2 ----------  LV end-systolic volume/bsa, 2-p            61    ml/m^2 ----------  Stroke volume/bsa, 2-p                     31.7   ml/m^2 ----------  LV e&', lateral                             5.33  cm/s   ----------  LV E/e&', lateral                           23.08        ----------  LV e&', medial                              5.77  cm/s   ----------  LV E/e&', medial                            21.32        ----------  LV e&', average                             5.55  cm/s   ----------  LV E/e&', average                           22.16        ----------    Ventricular septum                         Value        Reference  IVS thickness, ED                          11.7  mm     ----------    LVOT                                       Value        Reference  LVOT ID, S                                 20    mm     ----------  LVOT area                                  3.14  cm^2   ----------  LVOT peak velocity, S  104   cm/s   ----------  LVOT mean velocity, S                      62.4  cm/s   ----------  LVOT VTI, S                                15.9  cm     ----------  LVOT peak gradient, S                      4     mm Hg  ----------    Aorta                                      Value        Reference  Aortic root ID, ED                         30    mm     ----------    Left atrium                                Value        Reference  LA ID, A-P, ES                             39    mm     ----------  LA ID/bsa, A-P                     (H)     2.47  cm/m^2 <=2.2  LA volume, S                               56.4  ml     ----------  LA volume/bsa, S                           35.7  ml/m^2 ----------  LA volume, ES, 1-p A4C                     46.6  ml     ----------  LA volume/bsa, ES, 1-p A4C                 29.5  ml/m^2 ----------  LA volume, ES, 1-p A2C                     67.6  ml     ----------  LA volume/bsa, ES, 1-p A2C                 42.7  ml/m^2 ----------    Mitral valve                               Value        Reference  Mitral E-wave peak velocity                123   cm/s    ----------  Mitral A-wave peak velocity  81    cm/s   ----------  Mitral deceleration time           (L)     141   ms     150 - 230  Mitral peak gradient, D                    6     mm Hg  ----------  Mitral E/A ratio, peak                     1.5          ----------  Mitral maximal regurg velocity,            433   cm/s   ----------  PISA  Mitral regurg VTI, PISA                    145   cm     ----------  Mitral ERO, PISA                           0.07  cm^2   ----------  Mitral regurg volume, PISA                 10    ml     ----------    Right atrium                               Value        Reference  RA ID, S-I, ES, A4C                        44.3  mm     34 - 49  RA area, ES, A4C                           10.7  cm^2   8.3 - 19.5  RA volume, ES, A/L                         21    ml     ----------  RA volume/bsa, ES, A/L                     13.3  ml/m^2 ----------    Systemic veins                             Value        Reference  Estimated CVP                              8     mm Hg  ----------    Right ventricle                            Value        Reference  RV ID, ED, PLAX                            22.9  mm     19 - 38  TAPSE  18.9  mm     ----------  RV s&', lateral, S                          9.57  cm/s   ----------  Legend: (L)  and  (H)  mark values outside specified reference range.  ------------------------------------------------------------------- Prepared and Electronically Authenticated by  [name], M.D. 2019-02-21T14:56:56   Physicians   Panel Physicians Referring Physician Case Authorizing Physician  Jettie Booze, MD (Primary)    Procedures   RIGHT/LEFT HEART CATH AND CORONARY ANGIOGRAPHY  Conclusion     Prox Cx to Mid Cx lesion is 100% stenosed.  Mid LM to Dist LM lesion is 75% stenosed.  Ost Cx to Prox Cx lesion is 50% stenosed.  Ost LAD to Prox LAD lesion is 40%  stenosed.  Mid LAD lesion is 40% stenosed.  There is severe left ventricular systolic dysfunction.  The left ventricular ejection fraction is 25-35% by visual estimate.  There is no aortic valve stenosis.  LV end diastolic pressure is normal.  Prox RCA lesion is 90% stenosed.  Ao sat 93%; PA sat 62%; CO 3.5 L/min; CI 2.29; mean PCWP 2 mm Hg. Normal right heart pressures.   Severe left main disease.   Severely decreased LV function.  Normal LVEDP.    Resume heparin 6 hours post sheath pull.     Indications   Non-ST elevation (NSTEMI) myocardial infarction ([place]) [I21.4 (ICD-10-CM)]  Procedural Details/Technique   Technical Details The risks, benefits, and details of the procedure were explained to the patient. The patient verbalized understanding and wanted to proceed. Informed written consent was obtained.  PROCEDURE TECHNIQUE: After Xylocaine anesthesia, a 7 French sheath was placed in the right common femoral vein using ultrasound guidance. An image was captured and stored. A 7 French balloontipped Swan-Ganz catheter was advanced to the pulmonary artery under fluoroscopic guidance. Hemodynamic pressures were obtained. Oxygen saturations were obtained. After Xylocaine anesthesia, a 24F sheath was placed in the right femoral artery with a single anterior needle wall stick, using ultrasound guidance. An image was captured and stored. Left coronary angiography was done using a Judkins L4 guide catheter. Left heart cath was done using a JR4 catheter. Right coronary angiography was done using a Judkins R4 guide catheter.     Contrast: 40 cc    Estimated blood loss <50 mL.  During this procedure the patient was administered the following to achieve and maintain moderate conscious sedation: Versed 1 mg, Fentanyl 25 mcg, while the patient's heart rate, blood pressure, and oxygen saturation were continuously monitored. The period of conscious sedation was 42 minutes, of which I was  present face-to-face 100% of this time.  Complications   Complications documented before study signed (11/20/2017 2:24 PM EST)    No complications were associated with this study.  Documented by Jettie Booze, MD - 11/20/2017 2:08 PM EST    Coronary Findings   Diagnostic  Dominance: Left  Left Main  Mid LM to Dist LM lesion 75% stenosed  Mid LM to Dist LM lesion is 75% stenosed. The lesion is eccentric and ulcerative.  Left Anterior Descending  Ost LAD to Prox LAD lesion 40% stenosed  Ost LAD to Prox LAD lesion is 40% stenosed. The lesion is eccentric.  Mid LAD lesion 40% stenosed  Mid LAD lesion is 40% stenosed.  Left Circumflex  Ost Cx to Prox Cx lesion 50% stenosed  Ost Cx to Prox Cx lesion  is 50% stenosed.  Prox Cx to Mid Cx lesion 100% stenosed  Prox Cx to Mid Cx lesion is 100% stenosed.  Right Coronary Artery  Vessel is small.  Prox RCA lesion 90% stenosed  Prox RCA lesion is 90% stenosed.  Intervention   No interventions have been documented.  Right Heart   Right Heart Pressures LV EDP is normal. Ao sat 93%; PA sat 62%; CO 3.5 L/min; CI 2.29  Wall Motion   Resting       All segments of the heart are hypokinetic.          Left Heart   Left Ventricle The left ventricular size is normal. There is severe left ventricular systolic dysfunction. The left ventricular ejection fraction is 25-35% by visual estimate. There are LV function abnormalities due to global hypokinesis.  Aortic Valve There is no aortic valve stenosis.  Coronary Diagrams   Diagnostic Diagram       Implants     No implant documentation for this case.  MERGE Images   Show images for CARDIAC CATHETERIZATION   Link to Procedure Log   Procedure Log    Hemo Data    Most Recent Value  Fick Cardiac Output 3.52 L/min  Fick Cardiac Output Index 2.29 (L/min)/BSA  RA A Wave 12 mmHg  RA V Wave 10 mmHg  RA Mean 11 mmHg  RV Systolic Pressure 14 mmHg  RV Diastolic Pressure 0 mmHg    RV EDP 1 mmHg  PA Systolic Pressure 20 mmHg  PA Diastolic Pressure 11 mmHg  PA Mean 14 mmHg  PW A Wave 3 mmHg  PW V Wave 2 mmHg  PW Mean 2 mmHg  AO Systolic Pressure 086 mmHg  AO Diastolic Pressure 60 mmHg  AO Mean 81 mmHg  LV Systolic Pressure 761 mmHg  LV Diastolic Pressure 1 mmHg  LV EDP 8 mmHg  Left Ventricular Apex Extended Systolic Pressure 950 mmHg  Left Ventricular Apex Extended Diastolic Pressure 0 mmHg  Left Ventricular Apex Extended EDP Pressure 8 mmHg  QP/QS 1  TPVR Index 6.13 HRUI  TSVR Index 35.44 HRUI  PVR SVR Ratio 0.17  TPVR/TSVR Ratio 0.17    Assessment/Plan:  This 57 year old woman has 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  I agree that coronary bypass graft surgery is really the only good option for this patient with calcified distal left main stenosis.  She had a CT scan of the chest done on admission to rule out pulmonary embolism breath and this was negative for PE but did show extensive calcification of the ascending aorta and aortic arch.  This would definitely complicate coronary bypass graft surgery and will likely prevent cannulation and crossclamping of the ascending aorta.  I suspect that she will probably require the cannulation of her axillary artery for cardiopulmonary bypass and performance of her coronary bypasses on pump with a beating heart.  With her poor ejection fraction I do not think she will tolerate off-pump coronary bypass graft surgery.  Her operative risk is high due to the combination of her low ejection fraction, calcified aorta, recent stroke with significant residual disability and deconditioning, and she will likely require additional inpatient rehabilitation for recovery  postoperatively. I discussed the operative procedure with the patient and  her husband including alternatives, benefits and risks; including but not limited to bleeding, blood transfusion, infection, stroke, myocardial infarction, graft failure, heart block requiring a permanent pacemaker, organ dysfunction, and death.  Koren Kellogg understands and agrees to proceed.  We will schedule surgery for Thursday this week.  I spent 60 minutes performing this consultation and > 50% of this time was spent face to face counseling and coordinating the care of this patient's high-grade left main and severe multivessel coronary artery disease.  Gaye Pollack 11/21/2017

## 2017-11-21 NOTE — Telephone Encounter (Signed)
Contacted patient's husband to clarify we have not drawn any labs pertaining to heart because she is established with a cardiologist.  Hsuband voiced understanding.  He was just curious.   Copied from [place]. Topic: Inquiry >> Nov 21, 2017  8:35 AM Aurelio Brash B wrote: Reason for CRM: Pts husband called with questions about the pts lab work from Jan 17.  He wants to know if the lab work could have revealed the pt would have a heart attack.  He is requesting a call at [phone]

## 2017-11-21 NOTE — Progress Notes (Signed)
  Speech Language Pathology Treatment: Dysphagia  Patient Details Name: [name] MRN: [id number] DOB: 20-Sep-1961 Today's Date: 11/21/2017 Time: 0347-4259 SLP Time Calculation (min) (ACUTE ONLY): 10 min  Assessment / Plan / Recommendation Clinical Impression  Reinforced MBS findings and precautions. Pts demonstrates improved awareness of need for pills whole in puree and small single sips of liquid with upright posture. Education complete, will sign off.   HPI HPI: Kaitlyn Meloy Griffinis a 57 y.o.femalewith medical history significant ofsignificant stroke in 07/2017. She was in inpatient rehab until December 20, after which she returned home. She presented to ED with SOB where she was noted to be significantly hypoxic. She was placed on high flow nasal cannula but subsequently required BiPAP therapy. Imaging indicates bilateral lower lobe pneumonia, suspicious for aspiration. Pt had MBS 08/25/18 showing moderate oral and mild pharyngeal dysphagia mostly characterized by decreased oral transiting coordination resulting premature spillage of boluses into pharynx; dys 3 with thin liquids and chin tuck was recommended. Progressed to regular textures, thin liquids in CIR and was drinking with head neutral position with no overt s/sx of aspiration.       SLP Plan  All goals met       Recommendations  Diet recommendations: Regular;Thin liquid Liquids provided via: Cup;Straw Medication Administration: Whole meds with puree Supervision: Patient able to self feed Compensations: Slow rate;Small sips/bites;Minimize environmental distractions Postural Changes and/or Swallow Maneuvers: Seated upright 90 degrees                Plan: All goals met       GO               Kaitlyn [place], MA CCC-SLP [phone]  [name] 11/21/2017, 10:52 AM

## 2017-11-22 ENCOUNTER — Ambulatory Visit: Payer: BLUE CROSS/BLUE SHIELD | Admitting: Physical Therapy

## 2017-11-22 ENCOUNTER — Encounter: Payer: BLUE CROSS/BLUE SHIELD | Admitting: Occupational Therapy

## 2017-11-22 ENCOUNTER — Ambulatory Visit: Payer: Self-pay | Admitting: Neurology

## 2017-11-22 ENCOUNTER — Inpatient Hospital Stay (HOSPITAL_COMMUNITY): Payer: BLUE CROSS/BLUE SHIELD

## 2017-11-22 DIAGNOSIS — I25110 Atherosclerotic heart disease of native coronary artery with unstable angina pectoris: Secondary | ICD-10-CM

## 2017-11-22 DIAGNOSIS — Z01810 Encounter for preprocedural cardiovascular examination: Secondary | ICD-10-CM

## 2017-11-22 LAB — CBC
HCT: 38.8 % (ref 36.0–46.0)
Hemoglobin: 12.3 g/dL (ref 12.0–15.0)
MCH: 29.6 pg (ref 26.0–34.0)
MCHC: 31.7 g/dL (ref 30.0–36.0)
MCV: 93.3 fL (ref 78.0–100.0)
PLATELETS: 367 10*3/uL (ref 150–400)
RBC: 4.16 MIL/uL (ref 3.87–5.11)
RDW: 14.3 % (ref 11.5–15.5)
WBC: 8.8 10*3/uL (ref 4.0–10.5)

## 2017-11-22 LAB — URINALYSIS, ROUTINE W REFLEX MICROSCOPIC
BILIRUBIN URINE: NEGATIVE
Glucose, UA: NEGATIVE mg/dL
Hgb urine dipstick: NEGATIVE
KETONES UR: NEGATIVE mg/dL
Leukocytes, UA: NEGATIVE
NITRITE: NEGATIVE
PH: 6 (ref 5.0–8.0)
PROTEIN: NEGATIVE mg/dL
Specific Gravity, Urine: 1.009 (ref 1.005–1.030)

## 2017-11-22 LAB — BLOOD GAS, ARTERIAL
Acid-Base Excess: 1.7 mmol/L (ref 0.0–2.0)
Bicarbonate: 26 mmol/L (ref 20.0–28.0)
DRAWN BY: 24487
O2 Saturation: 91.1 %
Patient temperature: 98.6
pCO2 arterial: 42.6 mmHg (ref 32.0–48.0)
pH, Arterial: 7.402 (ref 7.350–7.450)
pO2, Arterial: 63.5 mmHg — ABNORMAL LOW (ref 83.0–108.0)

## 2017-11-22 LAB — HEMOGLOBIN A1C
HEMOGLOBIN A1C: 5.6 % (ref 4.8–5.6)
MEAN PLASMA GLUCOSE: 114.02 mg/dL

## 2017-11-22 LAB — HEPARIN LEVEL (UNFRACTIONATED)
Heparin Unfractionated: 0.89 IU/mL — ABNORMAL HIGH (ref 0.30–0.70)
Heparin Unfractionated: 0.61 IU/mL (ref 0.30–0.70)

## 2017-11-22 LAB — ABO/RH: ABO/RH(D): B POS

## 2017-11-22 MED ORDER — TRANEXAMIC ACID 1000 MG/10ML IV SOLN
1.5000 mg/kg/h | INTRAVENOUS | Status: AC
  Administered 2017-11-23: 1.5 mg/kg/h via INTRAVENOUS
  Filled 2017-11-22: qty 25

## 2017-11-22 MED ORDER — DEXMEDETOMIDINE HCL IN NACL 400 MCG/100ML IV SOLN
0.1000 ug/kg/h | INTRAVENOUS | Status: AC
  Administered 2017-11-23: .5 ug/kg/h via INTRAVENOUS
  Filled 2017-11-22: qty 100

## 2017-11-22 MED ORDER — DIAZEPAM 5 MG PO TABS
5.0000 mg | ORAL_TABLET | Freq: Once | ORAL | Status: AC
  Administered 2017-11-23: 5 mg via ORAL
  Filled 2017-11-22: qty 1

## 2017-11-22 MED ORDER — SODIUM CHLORIDE 0.9 % IV SOLN
INTRAVENOUS | Status: AC
  Administered 2017-11-23: .8 [IU]/h via INTRAVENOUS
  Filled 2017-11-22: qty 1

## 2017-11-22 MED ORDER — EPINEPHRINE PF 1 MG/ML IJ SOLN
0.0000 ug/min | INTRAVENOUS | Status: DC
  Filled 2017-11-22: qty 4

## 2017-11-22 MED ORDER — SODIUM CHLORIDE 0.9 % IV SOLN
30.0000 ug/min | INTRAVENOUS | Status: AC
  Administered 2017-11-23: 20 ug/min via INTRAVENOUS
  Filled 2017-11-22: qty 2

## 2017-11-22 MED ORDER — TRANEXAMIC ACID (OHS) PUMP PRIME SOLUTION
2.0000 mg/kg | INTRAVENOUS | Status: DC
  Filled 2017-11-22: qty 1.11

## 2017-11-22 MED ORDER — CHLORHEXIDINE GLUCONATE CLOTH 2 % EX PADS
6.0000 | MEDICATED_PAD | Freq: Once | CUTANEOUS | Status: AC
  Administered 2017-11-23: 6 via TOPICAL

## 2017-11-22 MED ORDER — POTASSIUM CHLORIDE 2 MEQ/ML IV SOLN
80.0000 meq | INTRAVENOUS | Status: DC
  Filled 2017-11-22: qty 40

## 2017-11-22 MED ORDER — PLASMA-LYTE 148 IV SOLN
INTRAVENOUS | Status: AC
  Administered 2017-11-23: 09:00:00
  Filled 2017-11-22: qty 2.5

## 2017-11-22 MED ORDER — HEPARIN SODIUM (PORCINE) 1000 UNIT/ML IJ SOLN
INTRAMUSCULAR | Status: DC
  Filled 2017-11-22: qty 30

## 2017-11-22 MED ORDER — MILRINONE LACTATE IN DEXTROSE 20-5 MG/100ML-% IV SOLN
0.1250 ug/kg/min | INTRAVENOUS | Status: AC
  Administered 2017-11-23: 0.375 ug/kg/min via INTRAVENOUS
  Filled 2017-11-22: qty 100

## 2017-11-22 MED ORDER — DOPAMINE-DEXTROSE 3.2-5 MG/ML-% IV SOLN
0.0000 ug/kg/min | INTRAVENOUS | Status: AC
  Administered 2017-11-23: 5 ug/kg/min via INTRAVENOUS
  Filled 2017-11-22: qty 250

## 2017-11-22 MED ORDER — TRANEXAMIC ACID (OHS) BOLUS VIA INFUSION
15.0000 mg/kg | INTRAVENOUS | Status: AC
  Administered 2017-11-23: 834 mg via INTRAVENOUS
  Filled 2017-11-22: qty 834

## 2017-11-22 MED ORDER — CEFUROXIME SODIUM 1.5 G IV SOLR
1.5000 g | INTRAVENOUS | Status: AC
  Administered 2017-11-23 (×2): 1.5 g via INTRAVENOUS
  Filled 2017-11-22: qty 1.5

## 2017-11-22 MED ORDER — TEMAZEPAM 15 MG PO CAPS
15.0000 mg | ORAL_CAPSULE | Freq: Once | ORAL | Status: AC | PRN
  Administered 2017-11-22: 15 mg via ORAL
  Filled 2017-11-22: qty 1

## 2017-11-22 MED ORDER — NITROGLYCERIN IN D5W 200-5 MCG/ML-% IV SOLN
2.0000 ug/min | INTRAVENOUS | Status: DC
  Filled 2017-11-22: qty 250

## 2017-11-22 MED ORDER — CHLORHEXIDINE GLUCONATE CLOTH 2 % EX PADS
6.0000 | MEDICATED_PAD | Freq: Once | CUTANEOUS | Status: AC
  Administered 2017-11-22: 6 via TOPICAL

## 2017-11-22 MED ORDER — VANCOMYCIN HCL 10 G IV SOLR
1250.0000 mg | INTRAVENOUS | Status: AC
  Administered 2017-11-23: 1250 mg via INTRAVENOUS
  Filled 2017-11-22: qty 1250

## 2017-11-22 MED ORDER — BISACODYL 5 MG PO TBEC: 5.0000 mg | DELAYED_RELEASE_TABLET | Freq: Once | ORAL | Status: DC

## 2017-11-22 MED ORDER — SODIUM CHLORIDE 0.9 % IV SOLN
750.0000 mg | INTRAVENOUS | Status: DC
  Filled 2017-11-22: qty 750

## 2017-11-22 MED ORDER — MAGNESIUM SULFATE 50 % IJ SOLN
40.0000 meq | INTRAMUSCULAR | Status: DC
  Filled 2017-11-22: qty 9.85

## 2017-11-22 MED ORDER — CHLORHEXIDINE GLUCONATE 0.12 % MT SOLN
15.0000 mL | Freq: Once | OROMUCOSAL | Status: AC
  Administered 2017-11-23: 15 mL via OROMUCOSAL
  Filled 2017-11-22: qty 15

## 2017-11-22 NOTE — Progress Notes (Signed)
ANTICOAGULATION CONSULT NOTE - Follow Up Consult  Pharmacy Consult for Heparin Indication: ACS/NSTEMI, multi-vessel CAD  Allergies  Allergen Reactions  . Lopressor [Metoprolol Tartrate] Swelling    Angioedema  . Lipitor [Atorvastatin] Diarrhea    Patient Measurements: Height: 5\' 2"  (157.5 cm) Weight: 122 lb 9.6 oz (55.6 kg) IBW/kg (Calculated) : 50.1 Heparin Dosing Weight: 56 kg  Vital Signs: Temp: 98.9 F (37.2 C) (02/27 1241) Temp Source: Oral (02/27 1241) BP: 102/58 (02/27 1241) Pulse Rate: 98 (02/27 0539)  Labs: Recent Labs    11/20/17 0421 11/21/17 0337 11/21/17 0834 11/22/17 0342 11/22/17 1236  HGB 13.8 12.3  --  12.3  --   HCT 43.3 39.0  --  38.8  --   PLT 364 344  --  367  --   HEPARINUNFRC 0.52 0.54 0.58 0.89* 0.61  CREATININE 0.80  --   --   --   --     Estimated Creatinine Clearance: 61.4 mL/min (by C-G formula based on SCr of 0.8 mg/dL).  Assessment:   57 yr old female continues on IV heparin for ACS/NSTEMI. S/p cardiac cath 2/25 which showed multi-vessel CAD.  TCTS consulted, for CABG on 2/28.    Heparin level is now therapeutic (0.61) on 1100 units/hr, after rate decrease this am when level was supratherapeutic (0.89) on 1250 units/hr.  CBC stable.  Goal of Therapy:  Heparin level 0.3-0.7 units/ml Monitor platelets by anticoagulation protocol: Yes   Plan:   Continue heparin drip at 1100 units/hr.  Daily heparin level and CBC while on heparin.  For CABG in am.  Arty Baumgartner, [place] Pager: [phone] or [phone] 11/22/2017,2:37 PM

## 2017-11-22 NOTE — Progress Notes (Addendum)
Progress Note  Patient Name: [name] Date of Encounter: 11/22/2017  Primary Cardiologist: [name]  Subjective   Denies dyspnea and chest pain.  Somewhat emotional when discussing upcoming surgery.  Denies access site discomfort or swelling.  Seems to have understanding of the magnitude of vascular issues and importance of smoking cessation.  Inpatient Medications    Scheduled Meds: . amoxicillin-clavulanate  1 tablet Oral Q12H  . aspirin EC  325 mg Oral Daily  . baclofen  5 mg Oral TID  . escitalopram  10 mg Oral Daily  . famotidine  20 mg Oral BID  . guaiFENesin  1,200 mg Oral BID  . [START ON 11/23/2017] heparin-papaverine-plasmalyte irrigation   Irrigation To OR  . hydrOXYzine  25 mg Oral QHS  . ipratropium-albuterol  3 mL Nebulization BID  . lisinopril  5 mg Oral Daily  . [START ON 11/23/2017] magnesium sulfate  40 mEq Other To OR  . [START ON 11/23/2017] potassium chloride  80 mEq Other To OR  . rosuvastatin  20 mg Oral Daily  . sodium chloride flush  3 mL Intravenous Q12H  . sodium chloride flush  3 mL Intravenous Q12H  . [START ON 11/23/2017] tranexamic acid  15 mg/kg Intravenous To OR  . [START ON 11/23/2017] tranexamic acid  2 mg/kg Intracatheter To OR   Continuous Infusions: . sodium chloride    . sodium chloride    . [START ON 11/23/2017] cefUROXime (ZINACEF)  IV    . [START ON 11/23/2017] cefUROXime (ZINACEF)  IV    . [START ON 11/23/2017] dexmedetomidine    . [START ON 11/23/2017] DOPamine    . [START ON 11/23/2017] epinephrine    . [START ON 11/23/2017] heparin 30,000 units/NS 1000 mL solution for CELLSAVER    . heparin 1,100 Units/hr (11/22/17 0508)  . [START ON 11/23/2017] insulin (NOVOLIN-R) infusion    . [START ON 11/23/2017] milrinone    . [START ON 11/23/2017] nitroGLYCERIN    . [START ON 11/23/2017] phenylephrine 20mg /258mL NS (0.08mg /ml) infusion    . [START ON 11/23/2017] tranexamic acid (CYKLOKAPRON) infusion ([facility])    . [START ON 11/23/2017]  vancomycin     PRN Meds: sodium chloride, sodium chloride, acetaminophen, albuterol, diphenhydrAMINE, food thickener, hydrocortisone cream, loperamide, ondansetron (ZOFRAN) IV, pneumococcal 23 valent vaccine, RESOURCE THICKENUP CLEAR, sodium chloride flush, sodium chloride flush   Vital Signs    Vitals:   11/22/17 0121 11/22/17 0539 11/22/17 0748 11/22/17 0830  BP: (!) 113/99 118/85  102/60  Pulse: (!) 103 98    Resp: (!) 21 18    Temp:  98 F (36.7 C)  98.5 F (36.9 C)  TempSrc:  Oral  Oral  SpO2: 91% 93% 100% 98%  Weight: 122 lb 9.6 oz (55.6 kg)     Height:        Intake/Output Summary (Last 24 hours) at 11/22/2017 0859 Last data filed at 11/22/2017 0600 Gross per 24 hour  Intake 1018.7 ml  Output -  Net 1018.7 ml   Filed Weights   11/20/17 0206 11/21/17 0534 11/22/17 0121  Weight: 120 lb 12.8 oz (54.8 kg) 125 lb 3.5 oz (56.8 kg) 122 lb 9.6 oz (55.6 kg)    Telemetry    Normal sinus rhythm- Personally Reviewed  ECG    No new tracings.- Personally Reviewed  Physical Exam  Alert  GEN: No acute distress.   Neck: No JVD Cardiac: RRR, no murmurs, rubs, or gallops.  Respiratory: Clear to auscultation bilaterally. GI: Soft, nontender,  non-distended  MS: No edema; No deformity. Neuro:  Paralysis right arm Psych: Normal affect   Labs    Chemistry Recent Labs  Lab 11/17/17 0508 11/18/17 0350 11/19/17 0427 11/20/17 0421  NA 142 141 139 138  K 4.0 3.2* 4.1 4.3  CL 108 101 101 101  CO2 24 28 27 25   GLUCOSE 113* 81 80 95  BUN 20 20 24* 23*  CREATININE 0.63 0.64 0.73 0.80  CALCIUM 9.0 9.0 9.3 9.3  PROT 6.2*  --  7.2  --   ALBUMIN 3.3*  --  3.4*  --   AST 64*  --  31  --   ALT 26  --  19  --   ALKPHOS 63  --  65  --   BILITOT 0.5  --  0.8  --   GFRNONAA >60 >60 >60 >60  GFRAA >60 >60 >60 >60  ANIONGAP 10 12 11 12      Hematology Recent Labs  Lab 11/20/17 0421 11/21/17 0337 11/22/17 0342  WBC 9.8 7.7 8.8  RBC 4.65 4.21 4.16  HGB 13.8 12.3 12.3    HCT 43.3 39.0 38.8  MCV 93.1 92.6 93.3  MCH 29.7 29.2 29.6  MCHC 31.9 31.5 31.7  RDW 14.0 14.0 14.3  PLT 364 344 367    Cardiac Enzymes Recent Labs  Lab 11/16/17 0528 11/16/17 0911 11/16/17 1509 11/16/17 2004  TROPONINI 0.41* 3.02* 10.50* 6.71*   No results for input(s): TROPIPOC in the last 168 hours.   BNP Recent Labs  Lab 11/16/17 0528 11/18/17 0350  BNP 675.0* 1,276.7*     DDimer  Recent Labs  Lab 11/16/17 0528  DDIMER 1.08*     Radiology    No results found.  Cardiac Studies   Cardiac catheterization 11/20/2017: Diagnostic Diagram        Severe left main disease.    Severely decreased LV function.  Normal LVEDP 8 mmHg and mean wedge pressure is 3 mmHg.   Patient Profile     57 y.o. 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  Coronary angiography demonstrated nondominant RCA with severe distal left main and moderate to moderately severe proximal circumflex and mid LAD disease.  Total occlusion of mid to distal circumflex.    Assessment & Plan    1. Severe multivessel coronary disease including left main.  Seen by Dr. Cyndia Bent and plan for CABG on Thursday.  Concerned about aortic calcification may lead to decannulation from axillary artery.  Patient understands heightened risk of surgery in her particular situation.  Given age and extent of disease, I agree that definitive surgical revascularization is the treatment of choice. 2. Tobacco use discussed and patient understands the importance of abstinence. 3. Ischemic cardiomyopathy and assumed significant region of hibernating myocardium.  Hopefully with time LV function will improve after revascularization.  Will need guideline based therapy for HFrEF.  Not yet on beta-blocker therapy (?  Metoprolol caused swelling).  We will institute  postop when clinically appropriate.  If unable to use beta-blocker therapy, consider Corlanor (ivabradine). 4. CV risk: Start high intensity statin therapy.  Atorvastatin listed as an agent that caused diarrhea.  Will try rosuvastatin.    For questions or updates, please contact [place] Please consult [email or web address] for contact info under Cardiology/STEMI.      Signed, Sinclair Grooms, MD  11/22/2017, 8:59 AM

## 2017-11-22 NOTE — Progress Notes (Signed)
PROGRESS NOTE   [name]  NAT:[id number]    DOB: 10-Nov-1960    DOA: 11/16/2017  PCP: [name], DO   I have briefly reviewed patients previous medical records in [facility].  Brief Narrative:  57 year old female with PMH of stroke with residual right hemiparesis and 07/2017, completed rehab in December and returned home, presented initially to [facility] with few days history of cough without chest pain and then became suddenly short of breath on night prior to admission.  In ED, significantly hypoxic, required BiPAP, CTA chest negative for PE but showed bibasilar pneumonia suspicious for aspiration.  Course complicated by elevated troponin, echo with low EF, Cardiology evaluated for NSTEMI and recommended transfer to [facility] for eventual cardiac catheterization.  Intermittently requiring BiPAP.  Cardiology following.  Improved.  Diet advanced to regular and thin liquids.  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 TCTS consulted and plan CABG on 2/28.   Assessment & Plan:   Active Problems:   Right hemiplegia ([place])   Hypokalemia   Former smoker   Acute respiratory failure with hypoxia (HCC)   Aspiration pneumonia (HCC)   Elevated troponin   NSTEMI (non-ST elevated myocardial infarction) ([place])   Acute on chronic systolic heart failure ([place])   1. Aspiration pneumonia: Likely from dysphagia due to recent stroke.  CTA chest 2/21: No PE but showed dependent infiltrates in both lower lobes.  Treated empirically with IV Unasyn.  Blood cultures x2: Negative, final report. Transitioned to oral Augmentin 2/25 to complete total 7 days treatment.  Chest x-ray 2/27: No active cardiopulmonary disease. 2. Dysphagia: Suspected due to recent acute stroke.  Speech therapy evaluated multiple times with final recommendations of regular diet and thin liquids. 3. NSTEMI/severe multivessel CAD: No chest pain reported.  Troponin peaked to  10.5.  Continue IV heparin drip.  Continue aspirin, statin.  Allergic to beta blockers.  Cardizem discontinued due to low EF.  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 As per cardiology follow-up, patient has significant comorbidities and will be at high risk for definitive revascularization. TCTS input appreciated and plan CABG on 2/28.  Rosuvastatin initiated. 4. Cardiomyopathy: Cardiology following.  Started lisinopril. 5. Acute systolic CHF: TTE 01/20/05: LVEF 35-40%, diffuse hypokinesis and grade 2 diastolic dysfunction.  IV Lasix increased to 40 mg twice daily.  ACEI started.  Improving.  - 1.9 L since admission. BNP 1277 on 2/23.  Improved and euvolemic.  Last dose of IV Lasix was on 11/19/17. 6. Acute respiratory failure with hypoxia: Due to aspiration pneumonia and acute CHF.  No PE by CT.  PRN BiPAP, oxygen.  Able to wean down to 2 L [place] oxygen.  Continue to wean to room air as tolerated for saturation >92%.  Currently saturating at 94% on room air. 7. Normocytic anemia: Likely lab error.  Hemoglobin 13.6 on 2/24. 8. Hypokalemia: Replaced.  Magnesium 1.9.  9. History of stroke with right hemiparesis: Continue aspirin and statins. 10. Diarrhea: Possibly related to antibiotics.  Low index of suspicion for C. difficile.  Improved after Imodium.  Monitor.   DVT prophylaxis: Currently on IV heparin drip. Code Status: Full. Family Communication: None at bedside today. Disposition: To be determined pending clinical improvement and further hospital evaluation.   Consultants:  Cardiology TCTS  Procedures:  BiPAP  Antimicrobials:  Unasyn- DC'ed PO Augmentin 2/25>  Subjective: Continues to feel better.  No chest pain, dyspnea or cough.  Skin rash and itching on buttocks significantly improved. No diarrhea reported.  She was interviewed and examined with her female RN in room.  ROS: As above.  Objective:  Vitals:     11/22/17 0539 11/22/17 0748 11/22/17 0830 11/22/17 1241  BP: 118/85  102/60 (!) 102/58  Pulse: 98     Resp: 18     Temp: 98 F (36.7 C)  98.5 F (36.9 C) 98.9 F (37.2 C)  TempSrc: Oral  Oral Oral  SpO2: 93% 100% 98% 94%  Weight:      Height:        Examination:  General exam: Pleasant middle-aged female, moderately built and nourished lying comfortably propped up in bed.  Does not appear in any distress. Respiratory system: Clear to auscultation.  No increased work of breathing.  Stable without change. Cardiovascular system: S1 & S2 heard, RRR. No JVD, murmurs, rubs, gallops or clicks. No pedal edema.  Telemetry personally reviewed: Sinus rhythm. Gastrointestinal system: Abdomen is nondistended, soft and nontender. No organomegaly or masses felt. Normal bowel sounds heard.  Stable without change. Central nervous system: Alert and oriented. No focal neurological deficits.  Stable without change. Extremities: Left limbs grade 5 x 5 power.  Right upper extremity get 0 x 5 power.  Right lower extremity grade 4 x 5 power. Skin: Significantly improved skin rash, none seen today over the hips. Psychiatry: Judgement and insight appear normal. Mood & affect appropriate.     Data Reviewed: I have personally reviewed following labs and imaging studies  CBC: Recent Labs  Lab 11/16/17 0528 11/17/17 0508 11/19/17 0427 11/20/17 0421 11/21/17 0337 11/22/17 0342  WBC 10.7* 11.8* 9.2 9.8 7.7 8.8  NEUTROABS 8.2*  --   --   --   --   --   HGB 12.8 11.4* 13.6 13.8 12.3 12.3  HCT 40.8 36.5 43.6 43.3 39.0 38.8  MCV 94.2 93.4 94.2 93.1 92.6 93.3  PLT 367 335 373 364 344 948   Basic Metabolic Panel: Recent Labs  Lab 11/16/17 0528 11/16/17 0911 11/17/17 0508 11/18/17 0350 11/19/17 0427 11/20/17 0421  NA 139  --  142 141 139 138  K 3.2*  --  4.0 3.2* 4.1 4.3  CL 106  --  108 101 101 101  CO2 23  --  24 28 27 25   GLUCOSE 142*  --  113* 81 80 95  BUN 22*  --  20 20 24* 23*   CREATININE 0.65  --  0.63 0.64 0.73 0.80  CALCIUM 8.9  --  9.0 9.0 9.3 9.3  MG  --  1.9  --   --   --   --    Liver Function Tests: Recent Labs  Lab 11/17/17 0508 11/19/17 0427  AST 64* 31  ALT 26 19  ALKPHOS 63 65  BILITOT 0.5 0.8  PROT 6.2* 7.2  ALBUMIN 3.3* 3.4*   Coagulation Profile: Recent Labs  Lab 11/16/17 0528  INR 0.87   Cardiac Enzymes: Recent Labs  Lab 11/16/17 0528 11/16/17 0911 11/16/17 1509 11/16/17 2004  TROPONINI 0.41* 3.02* 10.50* 6.71*     Recent Results (from the past 240 hour(s))  Blood culture (routine x 2)     Status: None   Collection Time: 11/16/17  5:32 AM  Result Value Ref Range Status   Specimen Description BLOOD LEFT ARM  Final   Special Requests   Final  BOTTLES DRAWN AEROBIC AND ANAEROBIC Blood Culture adequate volume   Culture   Final    NO GROWTH 5 DAYS Performed at [facility], [address] [place], [place] [address]    Report Status 11/21/2017 FINAL  Final  Blood culture (routine x 2)     Status: None   Collection Time: 11/16/17  5:37 AM  Result Value Ref Range Status   Specimen Description BLOOD LEFT ARM  Final   Special Requests   Final    Blood Culture results may not be optimal due to an inadequate volume of blood received in culture bottles BOTTLES DRAWN AEROBIC ONLY   Culture   Final    NO GROWTH 5 DAYS Performed at [facility], [address], [place], [place] [address]    Report Status 11/21/2017 FINAL  Final         Radiology Studies: Dg Chest 2 View  Result Date: 11/22/2017 CLINICAL DATA:  Pneumonia. EXAM: CHEST  2 VIEW COMPARISON:  Radiograph of November 17, 2017. FINDINGS: Stable cardiomegaly. No pneumothorax or pleural effusion is noted. Right basilar opacity noted on prior exam appears to have resolved. No acute pulmonary disease is noted. Bony thorax is unremarkable. IMPRESSION: No active cardiopulmonary disease. Electronically Signed   By: Marijo Conception, M.D.   On: 11/22/2017 11:47         Scheduled Meds: . amoxicillin-clavulanate  1 tablet Oral Q12H  . aspirin EC  325 mg Oral Daily  . baclofen  5 mg Oral TID  . bisacodyl  5 mg Oral Once  . [START ON 11/23/2017] chlorhexidine  15 mL Mouth/Throat Once  . Chlorhexidine Gluconate Cloth  6 each Topical Once   And  . [START ON 11/23/2017] Chlorhexidine Gluconate Cloth  6 each Topical Once  . [START ON 11/23/2017] diazepam  5 mg Oral Once  . escitalopram  10 mg Oral Daily  . famotidine  20 mg Oral BID  . guaiFENesin  1,200 mg Oral BID  . [START ON 11/23/2017] heparin-papaverine-plasmalyte irrigation   Irrigation To OR  . hydrOXYzine  25 mg Oral QHS  . ipratropium-albuterol  3 mL Nebulization BID  . lisinopril  5 mg Oral Daily  . [START ON 11/23/2017] magnesium sulfate  40 mEq Other To OR  . [START ON 11/23/2017] potassium chloride  80 mEq Other To OR  . rosuvastatin  20 mg Oral Daily  . sodium chloride flush  3 mL Intravenous Q12H  . sodium chloride flush  3 mL Intravenous Q12H  . [START ON 11/23/2017] tranexamic acid  15 mg/kg Intravenous To OR  . [START ON 11/23/2017] tranexamic acid  2 mg/kg Intracatheter To OR   Continuous Infusions: . sodium chloride    . sodium chloride    . [START ON 11/23/2017] cefUROXime (ZINACEF)  IV    . [START ON 11/23/2017] cefUROXime (ZINACEF)  IV    . [START ON 11/23/2017] dexmedetomidine    . [START ON 11/23/2017] DOPamine    . [START ON 11/23/2017] epinephrine    . [START ON 11/23/2017] heparin 30,000 units/NS 1000 mL solution for CELLSAVER    . heparin 1,100 Units/hr (11/22/17 0508)  . [START ON 11/23/2017] insulin (NOVOLIN-R) infusion    . [START ON 11/23/2017] milrinone    . [START ON 11/23/2017] nitroGLYCERIN    . [START ON 11/23/2017] phenylephrine 20mg /286mL NS (0.08mg /ml) infusion    . [START ON 11/23/2017] tranexamic acid (CYKLOKAPRON) infusion ([facility])    . [START ON 11/23/2017] vancomycin  LOS: 6 days     Vernell Leep, MD, FACP, [address] [facility]. [facility] Pager [phone]  [phone]  If 7PM-7AM, please contact night-coverage [email or web address] Password [facility] 11/22/2017, 2:18 PM

## 2017-11-22 NOTE — Care Management Note (Signed)
Case Management Note  Patient Details  Name: [name] MRN: [id number] Date of Birth: March 28, 1961  Subjective/Objective: Pt presented for SOB- Aspiration Pneumonia and NStemi. Prior hx of stoke with right sided weakness. 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 TCTS consulted and plan for CABG 11-23-17. PTA from home with support of husband. [place] and has support support of children. Pt has DME Cane, RW, 3n1, shower chair and wheelchair.                    Action/Plan: CM will continue to monitor for disposition needs post CABG.   Expected Discharge Date:                  Expected Discharge Plan:  [place]  In-House Referral:  NA  Discharge planning Services  CM Consult  Post Acute Care Choice:    Choice offered to:     DME Arranged:    DME Agency:     HH Arranged:    HH Agency:     Status of Service:  In process, will continue to follow  If discussed at Long Length of Stay Meetings, dates discussed:  11-21-17  Additional Comments:  Bethena Roys, RN 11/22/2017, 3:05 PM

## 2017-11-22 NOTE — Progress Notes (Signed)
Pre-op Cardiac Surgery  Carotid Findings:  RIght ICA 1-39%, upper end of scale. Left ICA 60-79%, lower end of scale.   Upper Extremity Right Left  Brachial Pressures 129/Triphasic 131/Triphasic  Radial Waveforms Triphasic triphasic  Ulnar Waveforms triphasic triphasic  Palmar Arch (Allen's Test) Abnormal Abnormal   Findings:  Right ulnar decreased greater than 50% with compression. Left ulnar obliterated with compression.      Lower  Extremity Right Left  Dorsalis Pedis 83/ biphasic 115/biphasic  Anterior Tibial    Posterior Tibial 92/biphasic 124/triphasic  Ankle/Brachial Indices 0.70 0.95    Findings: Right leg, moderate arterial disease at rest. Left leg, mild arterial disease at rest.  Oda Cogan, BS, RDMS, RVT

## 2017-11-22 NOTE — Progress Notes (Signed)
2 Days Post-Op Procedure(s) (LRB): RIGHT/LEFT HEART CATH AND CORONARY ANGIOGRAPHY (N/A) Subjective: No complaints  Objective: Vital signs in last 24 hours: Temp:  [98 F (36.7 C)-98.9 F (37.2 C)] 98.9 F (37.2 C) (02/27 1645) Pulse Rate:  [98-103] 98 (02/27 0539) Cardiac Rhythm: Normal sinus rhythm (02/27 0819) Resp:  [18-21] 18 (02/27 0539) BP: (102-118)/(57-99) 107/57 (02/27 1645) SpO2:  [90 %-100 %] 90 % (02/27 1645) Weight:  [55.6 kg (122 lb 9.6 oz)] 55.6 kg (122 lb 9.6 oz) (02/27 0121)  Hemodynamic parameters for last 24 hours:    Intake/Output from previous day: 02/26 0701 - 02/27 0700 In: 1018.7 [P.O.:720; I.V.:298.7] Out: -  Intake/Output this shift: No intake/output data recorded.  General appearance: alert and cooperative Heart: regular rate and rhythm Lungs: clear to auscultation bilaterally  Lab Results: Recent Labs    11/21/17 0337 11/22/17 0342  WBC 7.7 8.8  HGB 12.3 12.3  HCT 39.0 38.8  PLT 344 367   BMET:  Recent Labs    11/20/17 0421  NA 138  K 4.3  CL 101  CO2 25  GLUCOSE 95  BUN 23*  CREATININE 0.80  CALCIUM 9.3    PT/INR: No results for input(s): LABPROT, INR in the last 72 hours. ABG    Component Value Date/Time   PHART 7.346 (L) 11/20/2017 1337   HCO3 26.9 11/20/2017 1346   TCO2 28 11/20/2017 1346   ACIDBASEDEF 1.0 11/20/2017 1337   O2SAT 62.0 11/20/2017 1346   CBG (last 3)  No results for input(s): GLUCAP in the last 72 hours.  Assessment/Plan:  Severe left main and multi-vessel coronary artery disease.  Carotid dopplers show 60-79% left ICA stenosis. 1-39% right ICA stenosis. No flow detected in left vertebral artery. Subclavian flow appears normal on both sides.  PFT's with severe obstructive disease with FEV1 of 1.17 ( 48% predicted)  Plan CABG in am. Patient and husband have no further questions.  LOS: 6 days    Gaye Pollack 11/22/2017

## 2017-11-22 NOTE — Progress Notes (Signed)
ANTICOAGULATION CONSULT NOTE - Follow Up Consult  Pharmacy Consult for heparin Indication: CAD awaiting CABG  Labs: Recent Labs    11/20/17 0421 11/21/17 0337 11/21/17 0834 11/22/17 0342  HGB 13.8 12.3  --  12.3  HCT 43.3 39.0  --  38.8  PLT 364 344  --  367  HEPARINUNFRC 0.52 0.54 0.58 0.89*  CREATININE 0.80  --   --   --     Assessment: 56yo female supratherapeutic on heparin after several levels at goal; drawn appropriately and no gtt issues or signs of bleeding per RN.  Goal of Therapy:  Heparin level 0.3-0.7 units/ml   Plan:  Will decrease heparin gtt by ~10% to 1100 units/hr and check level in 6 hours.    Wynona Neat, PharmD, BCPS  11/22/2017,5:09 AM

## 2017-11-22 NOTE — Progress Notes (Signed)
[id number] Gave pt [facility] booklet, care guide and in the tube handout. Brief ed done as XRAY came for pt. Discussed keeping arms close to body. Stressed importance of mobility and IS after surgery. Left IS in room for RN to show pt. Pt stated she walks with cane in left hand due to stroke. Wrote down how to view pre op video if she so desires. Will follow up after surgery. Graylon Good RN BSN 11/22/2017 10:47 AM

## 2017-11-22 NOTE — Anesthesia Preprocedure Evaluation (Addendum)
Anesthesia Evaluation  Patient identified by MRN, date of birth, ID band Patient awake    Reviewed: Allergy & Precautions, NPO status , Patient's Chart, lab work & pertinent test results  History of Anesthesia Complications Negative for: history of anesthetic complications  Airway Mallampati: II  TM Distance: >3 FB Neck ROM: Full    Dental  (+) Teeth Intact   Pulmonary Current Smoker, former smoker,    Pulmonary exam normal        Cardiovascular + CAD and + Past MI  Normal cardiovascular exam+ Valvular Problems/Murmurs MR  Rhythm:Regular  Left ventricle: The cavity size was mildly dilated. Wall   thickness was increased in a pattern of mild LVH. Systolic   function was moderately reduced. The estimated ejection fraction   was in the range of 35% to 40%. Diffuse hypokinesis. There is   akinesis of the basal-midinferolateral, inferior, and   inferoseptal myocardium.    Neuro/Psych CVA, Residual Symptoms    GI/Hepatic negative GI ROS, Neg liver ROS,   Endo/Other  negative endocrine ROS  Renal/GU negative Renal ROS     Musculoskeletal   Abdominal   Peds  Hematology   Anesthesia Other Findings   Reproductive/Obstetrics                           Anesthesia Physical  Anesthesia Plan  ASA: IV  Anesthesia Plan: General   Post-op Pain Management:    Induction: Intravenous  PONV Risk Score and Plan: 3 and Ondansetron, Treatment may vary due to age or medical condition, Dexamethasone and Diphenhydramine  Airway Management Planned: Oral ETT  Additional Equipment: Arterial line, PA Cath, 3D TEE and Ultrasound Guidance Line Placement  Intra-op Plan:   Post-operative Plan: Post-operative intubation/ventilation  Informed Consent: I have reviewed the patients History and Physical, chart, labs and discussed the procedure including the risks, benefits and alternatives for the proposed anesthesia  with the patient or authorized representative who has indicated his/her understanding and acceptance.   Dental advisory given  Plan Discussed with: CRNA and Anesthesiologist  Anesthesia Plan Comments:        Anesthesia Quick Evaluation

## 2017-11-23 ENCOUNTER — Encounter (HOSPITAL_COMMUNITY): Payer: Self-pay | Admitting: Anesthesiology

## 2017-11-23 ENCOUNTER — Inpatient Hospital Stay (HOSPITAL_COMMUNITY): Payer: BLUE CROSS/BLUE SHIELD

## 2017-11-23 ENCOUNTER — Inpatient Hospital Stay (HOSPITAL_COMMUNITY): Payer: BLUE CROSS/BLUE SHIELD | Admitting: Anesthesiology

## 2017-11-23 ENCOUNTER — Encounter (HOSPITAL_COMMUNITY)
Admission: EM | Disposition: A | Discharge status: Rehabilitation Facility | Payer: Self-pay | Source: Home / Self Care | Attending: Internal Medicine

## 2017-11-23 DIAGNOSIS — I2510 Atherosclerotic heart disease of native coronary artery without angina pectoris: Secondary | ICD-10-CM | POA: Diagnosis present

## 2017-11-23 DIAGNOSIS — Z951 Presence of aortocoronary bypass graft: Secondary | ICD-10-CM

## 2017-11-23 DIAGNOSIS — I25810 Atherosclerosis of coronary artery bypass graft(s) without angina pectoris: Secondary | ICD-10-CM | POA: Diagnosis present

## 2017-11-23 HISTORY — PX: CORONARY ARTERY BYPASS GRAFT: SHX141

## 2017-11-23 HISTORY — PX: TEE WITHOUT CARDIOVERSION: SHX5443

## 2017-11-23 LAB — POCT I-STAT, CHEM 8
BUN: 16 mg/dL (ref 6–20)
BUN: 14 mg/dL (ref 6–20)
BUN: 11 mg/dL (ref 6–20)
BUN: 12 mg/dL (ref 6–20)
BUN: 12 mg/dL (ref 6–20)
BUN: 10 mg/dL (ref 6–20)
CALCIUM ION: 1.18 mmol/L (ref 1.15–1.40)
CALCIUM ION: 0.87 mmol/L — AB (ref 1.15–1.40)
CHLORIDE: 106 mmol/L (ref 101–111)
CHLORIDE: 103 mmol/L (ref 101–111)
CHLORIDE: 101 mmol/L (ref 101–111)
CHLORIDE: 105 mmol/L (ref 101–111)
CREATININE: 0.7 mg/dL (ref 0.44–1.00)
CREATININE: 0.6 mg/dL (ref 0.44–1.00)
CREATININE: 0.5 mg/dL (ref 0.44–1.00)
Calcium, Ion: 1.19 mmol/L (ref 1.15–1.40)
Calcium, Ion: 1.04 mmol/L — ABNORMAL LOW (ref 1.15–1.40)
Calcium, Ion: 0.99 mmol/L — ABNORMAL LOW (ref 1.15–1.40)
Calcium, Ion: 1.11 mmol/L — ABNORMAL LOW (ref 1.15–1.40)
Chloride: 103 mmol/L (ref 101–111)
Chloride: 99 mmol/L — ABNORMAL LOW (ref 101–111)
Creatinine, Ser: 0.4 mg/dL — ABNORMAL LOW (ref 0.44–1.00)
Creatinine, Ser: 0.5 mg/dL (ref 0.44–1.00)
Creatinine, Ser: 0.5 mg/dL (ref 0.44–1.00)
GLUCOSE: 101 mg/dL — AB (ref 65–99)
GLUCOSE: 90 mg/dL (ref 65–99)
Glucose, Bld: 77 mg/dL (ref 65–99)
Glucose, Bld: 97 mg/dL (ref 65–99)
Glucose, Bld: 111 mg/dL — ABNORMAL HIGH (ref 65–99)
Glucose, Bld: 117 mg/dL — ABNORMAL HIGH (ref 65–99)
HCT: 35 % — ABNORMAL LOW (ref 36.0–46.0)
HCT: 30 % — ABNORMAL LOW (ref 36.0–46.0)
HCT: 22 % — ABNORMAL LOW (ref 36.0–46.0)
HCT: 21 % — ABNORMAL LOW (ref 36.0–46.0)
HEMATOCRIT: 26 % — AB (ref 36.0–46.0)
HEMATOCRIT: 21 % — AB (ref 36.0–46.0)
HEMOGLOBIN: 11.9 g/dL — AB (ref 12.0–15.0)
Hemoglobin: 10.2 g/dL — ABNORMAL LOW (ref 12.0–15.0)
Hemoglobin: 7.5 g/dL — ABNORMAL LOW (ref 12.0–15.0)
Hemoglobin: 8.8 g/dL — ABNORMAL LOW (ref 12.0–15.0)
Hemoglobin: 7.1 g/dL — ABNORMAL LOW (ref 12.0–15.0)
Hemoglobin: 7.1 g/dL — ABNORMAL LOW (ref 12.0–15.0)
POTASSIUM: 4.4 mmol/L (ref 3.5–5.1)
POTASSIUM: 4.4 mmol/L (ref 3.5–5.1)
POTASSIUM: 4.1 mmol/L (ref 3.5–5.1)
Potassium: 4.4 mmol/L (ref 3.5–5.1)
Potassium: 3.8 mmol/L (ref 3.5–5.1)
Potassium: 4 mmol/L (ref 3.5–5.1)
SODIUM: 138 mmol/L (ref 135–145)
SODIUM: 139 mmol/L (ref 135–145)
SODIUM: 136 mmol/L (ref 135–145)
Sodium: 137 mmol/L (ref 135–145)
Sodium: 137 mmol/L (ref 135–145)
Sodium: 139 mmol/L (ref 135–145)
TCO2: 27 mmol/L (ref 22–32)
TCO2: 24 mmol/L (ref 22–32)
TCO2: 28 mmol/L (ref 22–32)
TCO2: 29 mmol/L (ref 22–32)
TCO2: 26 mmol/L (ref 22–32)
TCO2: 23 mmol/L (ref 22–32)

## 2017-11-23 LAB — CREATININE, SERUM
CREATININE: 0.66 mg/dL (ref 0.44–1.00)
GFR calc Af Amer: >60 mL/min (ref >60)

## 2017-11-23 LAB — GLUCOSE, CAPILLARY
GLUCOSE-CAPILLARY: 127 mg/dL — AB (ref 65–99)
GLUCOSE-CAPILLARY: 105 mg/dL — AB (ref 65–99)
GLUCOSE-CAPILLARY: 138 mg/dL — AB (ref 65–99)
Glucose-Capillary: 132 mg/dL — ABNORMAL HIGH (ref 65–99)
Glucose-Capillary: 116 mg/dL — ABNORMAL HIGH (ref 65–99)
Glucose-Capillary: 111 mg/dL — ABNORMAL HIGH (ref 65–99)

## 2017-11-23 LAB — POCT I-STAT 3, ART BLOOD GAS (G3+)
ACID-BASE DEFICIT: 4 mmol/L — AB (ref 0.0–2.0)
Acid-Base Excess: 5 mmol/L — ABNORMAL HIGH (ref 0.0–2.0)
Acid-Base Excess: 2 mmol/L (ref 0.0–2.0)
Acid-base deficit: 4 mmol/L — ABNORMAL HIGH (ref 0.0–2.0)
Acid-base deficit: 2 mmol/L (ref 0.0–2.0)
BICARBONATE: 21.9 mmol/L (ref 20.0–28.0)
Bicarbonate: 23.7 mmol/L (ref 20.0–28.0)
Bicarbonate: 26.2 mmol/L (ref 20.0–28.0)
Bicarbonate: 25.8 mmol/L (ref 20.0–28.0)
Bicarbonate: 20.6 mmol/L (ref 20.0–28.0)
Bicarbonate: 23.7 mmol/L (ref 20.0–28.0)
O2 SAT: 100 %
O2 Saturation: 100 %
O2 Saturation: 100 %
O2 Saturation: 95 %
O2 Saturation: 95 %
O2 Saturation: 97 %
PCO2 ART: 24.6 mmHg — AB (ref 32.0–48.0)
PCO2 ART: 45.6 mmHg (ref 32.0–48.0)
PCO2 ART: 44 mmHg (ref 32.0–48.0)
PH ART: 7.431 (ref 7.350–7.450)
PH ART: 7.636 — AB (ref 7.350–7.450)
PO2 ART: 310 mmHg — AB (ref 83.0–108.0)
PO2 ART: 89 mmHg (ref 83.0–108.0)
PO2 ART: 97 mmHg (ref 83.0–108.0)
TCO2: 25 mmol/L (ref 22–32)
TCO2: 27 mmol/L (ref 22–32)
TCO2: 27 mmol/L (ref 22–32)
TCO2: 22 mmol/L (ref 22–32)
TCO2: 23 mmol/L (ref 22–32)
TCO2: 25 mmol/L (ref 22–32)
pCO2 arterial: 35.6 mmHg (ref 32.0–48.0)
pCO2 arterial: 36.5 mmHg (ref 32.0–48.0)
pCO2 arterial: 33.3 mmHg (ref 32.0–48.0)
pH, Arterial: 7.457 — ABNORMAL HIGH (ref 7.350–7.450)
pH, Arterial: 7.392 (ref 7.350–7.450)
pH, Arterial: 7.291 — ABNORMAL LOW (ref 7.350–7.450)
pH, Arterial: 7.341 — ABNORMAL LOW (ref 7.350–7.450)
pO2, Arterial: 423 mmHg — ABNORMAL HIGH (ref 83.0–108.0)
pO2, Arterial: 413 mmHg — ABNORMAL HIGH (ref 83.0–108.0)
pO2, Arterial: 68 mmHg — ABNORMAL LOW (ref 83.0–108.0)

## 2017-11-23 LAB — CBC
HCT: 40 % (ref 36.0–46.0)
HCT: 22.9 % — ABNORMAL LOW (ref 36.0–46.0)
HEMATOCRIT: 27.6 % — AB (ref 36.0–46.0)
HEMOGLOBIN: 8.8 g/dL — AB (ref 12.0–15.0)
Hemoglobin: 12.6 g/dL (ref 12.0–15.0)
Hemoglobin: 7.4 g/dL — ABNORMAL LOW (ref 12.0–15.0)
MCH: 29.2 pg (ref 26.0–34.0)
MCH: 28.9 pg (ref 26.0–34.0)
MCH: 29.4 pg (ref 26.0–34.0)
MCHC: 31.5 g/dL (ref 30.0–36.0)
MCHC: 31.9 g/dL (ref 30.0–36.0)
MCHC: 32.3 g/dL (ref 30.0–36.0)
MCV: 92.6 fL (ref 78.0–100.0)
MCV: 90.5 fL (ref 78.0–100.0)
MCV: 90.9 fL (ref 78.0–100.0)
PLATELETS: 330 10*3/uL (ref 150–400)
PLATELETS: 224 10*3/uL (ref 150–400)
Platelets: 241 10*3/uL (ref 150–400)
RBC: 4.32 MIL/uL (ref 3.87–5.11)
RBC: 3.05 MIL/uL — ABNORMAL LOW (ref 3.87–5.11)
RBC: 2.52 MIL/uL — ABNORMAL LOW (ref 3.87–5.11)
RDW: 14 % (ref 11.5–15.5)
RDW: 13.9 % (ref 11.5–15.5)
RDW: 14.1 % (ref 11.5–15.5)
WBC: 8.6 10*3/uL (ref 4.0–10.5)
WBC: 15.3 10*3/uL — ABNORMAL HIGH (ref 4.0–10.5)
WBC: 10.2 10*3/uL (ref 4.0–10.5)

## 2017-11-23 LAB — BASIC METABOLIC PANEL
Anion gap: 9 (ref 5–15)
BUN: 18 mg/dL (ref 6–20)
CALCIUM: 9.1 mg/dL (ref 8.9–10.3)
CO2: 24 mmol/L (ref 22–32)
CREATININE: 0.78 mg/dL (ref 0.44–1.00)
Chloride: 103 mmol/L (ref 101–111)
GFR calc Af Amer: >60 mL/min (ref >60)
GLUCOSE: 100 mg/dL — AB (ref 65–99)
Potassium: 4 mmol/L (ref 3.5–5.1)
Sodium: 136 mmol/L (ref 135–145)

## 2017-11-23 LAB — HEMOGLOBIN AND HEMATOCRIT, BLOOD
HEMATOCRIT: 27 % — AB (ref 36.0–46.0)
Hemoglobin: 8.6 g/dL — ABNORMAL LOW (ref 12.0–15.0)

## 2017-11-23 LAB — PROTIME-INR
INR: 0.95
INR: 1.27
PROTHROMBIN TIME: 12.6 s (ref 11.4–15.2)
Prothrombin Time: 15.8 seconds — ABNORMAL HIGH (ref 11.4–15.2)

## 2017-11-23 LAB — APTT
APTT: 38 s — AB (ref 24–36)
aPTT: >200 seconds (ref 24–36)

## 2017-11-23 LAB — POCT I-STAT 4, (NA,K, GLUC, HGB,HCT)
GLUCOSE: 122 mg/dL — AB (ref 65–99)
HCT: 24 % — ABNORMAL LOW (ref 36.0–46.0)
HEMOGLOBIN: 8.2 g/dL — AB (ref 12.0–15.0)
POTASSIUM: 3.8 mmol/L (ref 3.5–5.1)
Sodium: 139 mmol/L (ref 135–145)

## 2017-11-23 LAB — PLATELET COUNT: PLATELETS: 270 10*3/uL (ref 150–400)

## 2017-11-23 LAB — MAGNESIUM: Magnesium: 3.2 mg/dL — ABNORMAL HIGH (ref 1.7–2.4)

## 2017-11-23 LAB — SURGICAL PCR SCREEN
MRSA, PCR: NEGATIVE
Staphylococcus aureus: NEGATIVE

## 2017-11-23 LAB — HEPARIN LEVEL (UNFRACTIONATED): Heparin Unfractionated: 0.76 IU/mL — ABNORMAL HIGH (ref 0.30–0.70)

## 2017-11-23 SURGERY — CORONARY ARTERY BYPASS GRAFTING (CABG)
Anesthesia: General | Site: Chest

## 2017-11-23 MED ORDER — PHENYLEPHRINE 40 MCG/ML (10ML) SYRINGE FOR IV PUSH (FOR BLOOD PRESSURE SUPPORT)
PREFILLED_SYRINGE | INTRAVENOUS | Status: AC
  Filled 2017-11-23: qty 20

## 2017-11-23 MED ORDER — ROCURONIUM BROMIDE 50 MG/5ML IV SOLN
INTRAVENOUS | Status: AC
  Filled 2017-11-23: qty 1

## 2017-11-23 MED ORDER — ACETAMINOPHEN 650 MG RE SUPP
650.0000 mg | Freq: Once | RECTAL | Status: AC
  Administered 2017-11-23: 650 mg via RECTAL

## 2017-11-23 MED ORDER — SUCCINYLCHOLINE CHLORIDE 200 MG/10ML IV SOSY
PREFILLED_SYRINGE | INTRAVENOUS | Status: DC | PRN
  Administered 2017-11-23: 20 mg via INTRAVENOUS

## 2017-11-23 MED ORDER — PHENYLEPHRINE HCL 10 MG/ML IJ SOLN
0.0000 ug/min | INTRAMUSCULAR | Status: DC
  Administered 2017-11-23: 10 ug/min via INTRAVENOUS
  Administered 2017-11-23: 20 ug/min via INTRAVENOUS
  Filled 2017-11-23: qty 2

## 2017-11-23 MED ORDER — ORAL CARE MOUTH RINSE
15.0000 mL | Freq: Four times a day (QID) | OROMUCOSAL | Status: DC
  Administered 2017-11-23 – 2017-11-27 (×15): 15 mL via OROMUCOSAL

## 2017-11-23 MED ORDER — FENTANYL CITRATE (PF) 250 MCG/5ML IJ SOLN
INTRAMUSCULAR | Status: DC | PRN
  Administered 2017-11-23: 50 ug via INTRAVENOUS
  Administered 2017-11-23 (×2): 250 ug via INTRAVENOUS
  Administered 2017-11-23 (×2): 100 ug via INTRAVENOUS
  Administered 2017-11-23 (×2): 250 ug via INTRAVENOUS

## 2017-11-23 MED ORDER — PROTAMINE SULFATE 10 MG/ML IV SOLN
INTRAVENOUS | Status: DC | PRN
  Administered 2017-11-23: 10 mg via INTRAVENOUS
  Administered 2017-11-23: 30 mg via INTRAVENOUS
  Administered 2017-11-23 (×2): 40 mg via INTRAVENOUS
  Administered 2017-11-23 (×2): 30 mg via INTRAVENOUS

## 2017-11-23 MED ORDER — ESMOLOL HCL 100 MG/10ML IV SOLN
INTRAVENOUS | Status: DC | PRN
  Administered 2017-11-23: 10 mg via INTRAVENOUS
  Administered 2017-11-23: 30 mg via INTRAVENOUS

## 2017-11-23 MED ORDER — LACTATED RINGERS IV SOLN
INTRAVENOUS | Status: DC
  Administered 2017-11-23: 14:00:00 via INTRAVENOUS
  Administered 2017-11-25: 20 mL/h via INTRAVENOUS

## 2017-11-23 MED ORDER — ETOMIDATE 2 MG/ML IV SOLN
INTRAVENOUS | Status: AC
  Filled 2017-11-23: qty 10

## 2017-11-23 MED ORDER — HEPARIN SODIUM (PORCINE) 1000 UNIT/ML IJ SOLN
INTRAMUSCULAR | Status: AC
  Filled 2017-11-23: qty 1

## 2017-11-23 MED ORDER — VANCOMYCIN HCL IN DEXTROSE 1-5 GM/200ML-% IV SOLN
1000.0000 mg | Freq: Once | INTRAVENOUS | Status: AC
  Administered 2017-11-23: 1000 mg via INTRAVENOUS
  Filled 2017-11-23: qty 200

## 2017-11-23 MED ORDER — PHENYLEPHRINE 40 MCG/ML (10ML) SYRINGE FOR IV PUSH (FOR BLOOD PRESSURE SUPPORT)
PREFILLED_SYRINGE | INTRAVENOUS | Status: DC | PRN
  Administered 2017-11-23 (×5): 80 ug via INTRAVENOUS

## 2017-11-23 MED ORDER — DOCUSATE SODIUM 100 MG PO CAPS
200.0000 mg | ORAL_CAPSULE | Freq: Every day | ORAL | Status: DC
  Administered 2017-11-24 – 2017-11-26 (×3): 200 mg via ORAL
  Filled 2017-11-23 (×3): qty 2

## 2017-11-23 MED ORDER — TRAMADOL HCL 50 MG PO TABS: 50.0000 mg | ORAL_TABLET | ORAL | Status: DC | PRN

## 2017-11-23 MED ORDER — MIDAZOLAM HCL 10 MG/2ML IJ SOLN
INTRAMUSCULAR | Status: AC
  Filled 2017-11-23: qty 2

## 2017-11-23 MED ORDER — BISACODYL 5 MG PO TBEC
10.0000 mg | DELAYED_RELEASE_TABLET | Freq: Every day | ORAL | Status: DC
  Administered 2017-11-24 – 2017-11-25 (×2): 10 mg via ORAL
  Filled 2017-11-23 (×2): qty 2

## 2017-11-23 MED ORDER — MIDAZOLAM HCL 2 MG/2ML IJ SOLN: 2.0000 mg | INTRAMUSCULAR | Status: DC | PRN

## 2017-11-23 MED ORDER — LEVALBUTEROL HCL 0.63 MG/3ML IN NEBU: 0.6300 mg | INHALATION_SOLUTION | Freq: Three times a day (TID) | RESPIRATORY_TRACT | Status: DC | PRN

## 2017-11-23 MED ORDER — VECURONIUM BROMIDE 10 MG IV SOLR
INTRAVENOUS | Status: AC
  Filled 2017-11-23: qty 10

## 2017-11-23 MED ORDER — SODIUM CHLORIDE 0.9 % IV SOLN
0.0000 ug/kg/h | INTRAVENOUS | Status: DC
  Administered 2017-11-23: 0.5 ug/kg/h via INTRAVENOUS
  Filled 2017-11-23: qty 2

## 2017-11-23 MED ORDER — INSULIN ASPART 100 UNIT/ML ~~LOC~~ SOLN
0.0000 [IU] | SUBCUTANEOUS | Status: DC
  Administered 2017-11-24 (×2): 2 [IU] via SUBCUTANEOUS

## 2017-11-23 MED ORDER — HEMOSTATIC AGENTS (NO CHARGE) OPTIME
TOPICAL | Status: DC | PRN
  Administered 2017-11-23: 1 via TOPICAL

## 2017-11-23 MED ORDER — ROCURONIUM BROMIDE 10 MG/ML (PF) SYRINGE
PREFILLED_SYRINGE | INTRAVENOUS | Status: DC | PRN
  Administered 2017-11-23: 50 mg via INTRAVENOUS
  Administered 2017-11-23: 100 mg via INTRAVENOUS

## 2017-11-23 MED ORDER — ACETAMINOPHEN 160 MG/5ML PO SOLN: 1000.0000 mg | Freq: Four times a day (QID) | ORAL | Status: DC

## 2017-11-23 MED ORDER — CHLORHEXIDINE GLUCONATE 0.12 % MT SOLN
15.0000 mL | OROMUCOSAL | Status: AC
  Administered 2017-11-23: 15 mL via OROMUCOSAL

## 2017-11-23 MED ORDER — 0.9 % SODIUM CHLORIDE (POUR BTL) OPTIME
TOPICAL | Status: DC | PRN
  Administered 2017-11-23: 5000 mL

## 2017-11-23 MED ORDER — LIDOCAINE 2% (20 MG/ML) 5 ML SYRINGE
INTRAMUSCULAR | Status: DC | PRN
  Administered 2017-11-23: 100 mg via INTRAVENOUS

## 2017-11-23 MED ORDER — CARVEDILOL 3.125 MG PO TABS
3.1250 mg | ORAL_TABLET | Freq: Two times a day (BID) | ORAL | Status: DC
  Administered 2017-11-24 – 2017-11-28 (×9): 3.125 mg via ORAL
  Filled 2017-11-23 (×9): qty 1

## 2017-11-23 MED ORDER — SODIUM CHLORIDE 0.9% FLUSH
3.0000 mL | Freq: Two times a day (BID) | INTRAVENOUS | Status: DC
  Administered 2017-11-24 – 2017-11-26 (×6): 3 mL via INTRAVENOUS

## 2017-11-23 MED ORDER — LACTATED RINGERS IV SOLN
INTRAVENOUS | Status: DC | PRN
  Administered 2017-11-23 (×2): via INTRAVENOUS

## 2017-11-23 MED ORDER — SODIUM CHLORIDE 0.9 % IJ SOLN
OROMUCOSAL | Status: DC | PRN
  Administered 2017-11-23 (×3): via TOPICAL

## 2017-11-23 MED ORDER — PHENYLEPHRINE HCL 10 MG/ML IJ SOLN
INTRAVENOUS | Status: DC | PRN
  Administered 2017-11-23: 25 ug/min via INTRAVENOUS

## 2017-11-23 MED ORDER — SODIUM CHLORIDE 0.9 % IV SOLN
1.5000 g | Freq: Two times a day (BID) | INTRAVENOUS | Status: DC
  Administered 2017-11-24 (×2): 1.5 g via INTRAVENOUS
  Filled 2017-11-23 (×3): qty 1.5

## 2017-11-23 MED ORDER — LACTATED RINGERS IV SOLN: INTRAVENOUS | Status: DC

## 2017-11-23 MED ORDER — ALBUMIN HUMAN 5 % IV SOLN
INTRAVENOUS | Status: DC | PRN
  Administered 2017-11-23: 12:00:00 via INTRAVENOUS

## 2017-11-23 MED ORDER — THROMBIN 20000 UNITS EX SOLR
CUTANEOUS | Status: DC | PRN
  Administered 2017-11-23: 20000 [IU] via TOPICAL

## 2017-11-23 MED ORDER — LACTATED RINGERS IV SOLN
500.0000 mL | Freq: Once | INTRAVENOUS | Status: AC | PRN
  Administered 2017-11-23: 500 mL via INTRAVENOUS

## 2017-11-23 MED ORDER — SODIUM CHLORIDE 0.45 % IV SOLN
INTRAVENOUS | Status: DC | PRN
  Administered 2017-11-23: 14:00:00 via INTRAVENOUS

## 2017-11-23 MED ORDER — MORPHINE SULFATE (PF) 4 MG/ML IV SOLN: 1.0000 mg | INTRAVENOUS | Status: AC | PRN

## 2017-11-23 MED ORDER — PROTAMINE SULFATE 10 MG/ML IV SOLN
INTRAVENOUS | Status: AC
  Filled 2017-11-23: qty 25

## 2017-11-23 MED ORDER — OXYCODONE HCL 5 MG PO TABS
5.0000 mg | ORAL_TABLET | ORAL | Status: DC | PRN
  Administered 2017-11-24: 5 mg via ORAL
  Administered 2017-11-24 – 2017-11-27 (×8): 10 mg via ORAL
  Filled 2017-11-23 (×2): qty 2
  Filled 2017-11-23: qty 1
  Filled 2017-11-23 (×7): qty 2

## 2017-11-23 MED ORDER — MORPHINE SULFATE (PF) 4 MG/ML IV SOLN
2.0000 mg | INTRAVENOUS | Status: DC | PRN
  Administered 2017-11-23 – 2017-11-24 (×3): 2 mg via INTRAVENOUS
  Filled 2017-11-23 (×5): qty 1

## 2017-11-23 MED ORDER — SODIUM CHLORIDE 0.9 % IV SOLN
INTRAVENOUS | Status: DC
  Administered 2017-11-23: 14:00:00 via INTRAVENOUS

## 2017-11-23 MED ORDER — POTASSIUM CHLORIDE 10 MEQ/50ML IV SOLN
10.0000 meq | INTRAVENOUS | Status: AC
  Administered 2017-11-23 (×3): 10 meq via INTRAVENOUS

## 2017-11-23 MED ORDER — VECURONIUM BROMIDE 10 MG IV SOLR
INTRAVENOUS | Status: DC | PRN
  Administered 2017-11-23: 4 mg via INTRAVENOUS
  Administered 2017-11-23: 6 mg via INTRAVENOUS

## 2017-11-23 MED ORDER — ALBUMIN HUMAN 5 % IV SOLN
250.0000 mL | INTRAVENOUS | Status: AC | PRN
  Administered 2017-11-23 (×4): 250 mL via INTRAVENOUS
  Filled 2017-11-23 (×2): qty 250

## 2017-11-23 MED ORDER — ASPIRIN 81 MG PO CHEW: 324.0000 mg | CHEWABLE_TABLET | Freq: Every day | ORAL | Status: DC

## 2017-11-23 MED ORDER — CHLORHEXIDINE GLUCONATE 0.12% ORAL RINSE (MEDLINE KIT)
15.0000 mL | Freq: Two times a day (BID) | OROMUCOSAL | Status: DC
  Administered 2017-11-23 – 2017-11-27 (×6): 15 mL via OROMUCOSAL

## 2017-11-23 MED ORDER — HEPARIN SODIUM (PORCINE) 1000 UNIT/ML IJ SOLN
INTRAMUSCULAR | Status: DC | PRN
  Administered 2017-11-23: 18000 [IU] via INTRAVENOUS

## 2017-11-23 MED ORDER — SODIUM CHLORIDE 0.9 % IV SOLN
INTRAVENOUS | Status: DC | PRN
  Administered 2017-11-23: 08:00:00 via INTRAVENOUS

## 2017-11-23 MED ORDER — SODIUM BICARBONATE 8.4 % IV SOLN
50.0000 meq | Freq: Once | INTRAVENOUS | Status: AC
  Administered 2017-11-23: 50 meq via INTRAVENOUS

## 2017-11-23 MED ORDER — THROMBIN 20000 UNITS EX SOLR
CUTANEOUS | Status: AC
  Filled 2017-11-23: qty 20000

## 2017-11-23 MED ORDER — MORPHINE SULFATE (PF) 2 MG/ML IV SOLN: 2.0000 mg | INTRAVENOUS | Status: DC | PRN

## 2017-11-23 MED ORDER — ESMOLOL HCL 100 MG/10ML IV SOLN
INTRAVENOUS | Status: AC
  Filled 2017-11-23: qty 20

## 2017-11-23 MED ORDER — NITROGLYCERIN IN D5W 200-5 MCG/ML-% IV SOLN
0.0000 ug/min | INTRAVENOUS | Status: DC
  Administered 2017-11-23: 10 ug/min via INTRAVENOUS

## 2017-11-23 MED ORDER — MILRINONE LACTATE IN DEXTROSE 20-5 MG/100ML-% IV SOLN
0.3000 ug/kg/min | INTRAVENOUS | Status: DC
  Administered 2017-11-23 (×2): 0.3 ug/kg/min via INTRAVENOUS
  Filled 2017-11-23: qty 100

## 2017-11-23 MED ORDER — ONDANSETRON HCL 4 MG/2ML IJ SOLN
4.0000 mg | Freq: Four times a day (QID) | INTRAMUSCULAR | Status: DC | PRN
  Administered 2017-11-26: 4 mg via INTRAVENOUS
  Filled 2017-11-23: qty 2

## 2017-11-23 MED ORDER — EPHEDRINE 5 MG/ML INJ
INTRAVENOUS | Status: AC
  Filled 2017-11-23: qty 10

## 2017-11-23 MED ORDER — PROPOFOL 10 MG/ML IV BOLUS
INTRAVENOUS | Status: AC
  Filled 2017-11-23: qty 20

## 2017-11-23 MED ORDER — PANTOPRAZOLE SODIUM 40 MG PO TBEC
40.0000 mg | DELAYED_RELEASE_TABLET | Freq: Every day | ORAL | Status: DC
  Administered 2017-11-24 – 2017-11-26 (×3): 40 mg via ORAL
  Filled 2017-11-23 (×3): qty 1

## 2017-11-23 MED ORDER — DOPAMINE-DEXTROSE 3.2-5 MG/ML-% IV SOLN
0.0000 ug/kg/min | INTRAVENOUS | Status: DC
  Administered 2017-11-23: 3 ug/kg/min via INTRAVENOUS

## 2017-11-23 MED ORDER — LIDOCAINE 2% (20 MG/ML) 5 ML SYRINGE
INTRAMUSCULAR | Status: AC
  Filled 2017-11-23: qty 5

## 2017-11-23 MED ORDER — LACTATED RINGERS IV SOLN
INTRAVENOUS | Status: DC | PRN
  Administered 2017-11-23: 07:00:00 via INTRAVENOUS

## 2017-11-23 MED ORDER — MAGNESIUM SULFATE 4 GM/100ML IV SOLN
4.0000 g | Freq: Once | INTRAVENOUS | Status: AC
  Administered 2017-11-23: 4 g via INTRAVENOUS
  Filled 2017-11-23: qty 100

## 2017-11-23 MED ORDER — SODIUM CHLORIDE 0.9% FLUSH: 3.0000 mL | INTRAVENOUS | Status: DC | PRN

## 2017-11-23 MED ORDER — PHENYLEPHRINE 40 MCG/ML (10ML) SYRINGE FOR IV PUSH (FOR BLOOD PRESSURE SUPPORT)
PREFILLED_SYRINGE | INTRAVENOUS | Status: AC
  Filled 2017-11-23: qty 10

## 2017-11-23 MED ORDER — SODIUM CHLORIDE 0.9 % IV SOLN: 250.0000 mL | INTRAVENOUS | Status: DC

## 2017-11-23 MED ORDER — ASPIRIN EC 325 MG PO TBEC
325.0000 mg | DELAYED_RELEASE_TABLET | Freq: Every day | ORAL | Status: DC
  Administered 2017-11-24 – 2017-11-26 (×3): 325 mg via ORAL
  Filled 2017-11-23 (×3): qty 1

## 2017-11-23 MED ORDER — FENTANYL CITRATE (PF) 250 MCG/5ML IJ SOLN
INTRAMUSCULAR | Status: AC
  Filled 2017-11-23: qty 25

## 2017-11-23 MED ORDER — INSULIN REGULAR HUMAN 100 UNIT/ML IJ SOLN
INTRAMUSCULAR | Status: DC
  Administered 2017-11-23: 0.6 [IU]/h via INTRAVENOUS
  Filled 2017-11-23: qty 1

## 2017-11-23 MED ORDER — ACETAMINOPHEN 500 MG PO TABS
1000.0000 mg | ORAL_TABLET | Freq: Four times a day (QID) | ORAL | Status: DC
  Administered 2017-11-24 – 2017-11-27 (×11): 1000 mg via ORAL
  Filled 2017-11-23 (×11): qty 2

## 2017-11-23 MED ORDER — MIDAZOLAM HCL 5 MG/5ML IJ SOLN
INTRAMUSCULAR | Status: DC | PRN
  Administered 2017-11-23: 3 mg via INTRAVENOUS
  Administered 2017-11-23: 2 mg via INTRAVENOUS
  Administered 2017-11-23: 3 mg via INTRAVENOUS
  Administered 2017-11-23: 2 mg via INTRAVENOUS

## 2017-11-23 MED ORDER — MORPHINE SULFATE (PF) 2 MG/ML IV SOLN: 1.0000 mg | INTRAVENOUS | Status: DC | PRN

## 2017-11-23 MED ORDER — BISACODYL 10 MG RE SUPP: 10.0000 mg | Freq: Every day | RECTAL | Status: DC

## 2017-11-23 MED ORDER — INSULIN REGULAR BOLUS VIA INFUSION
0.0000 [IU] | Freq: Three times a day (TID) | INTRAVENOUS | Status: DC
  Filled 2017-11-23: qty 10

## 2017-11-23 MED ORDER — ACETAMINOPHEN 160 MG/5ML PO SOLN: 650.0000 mg | Freq: Once | ORAL | Status: AC

## 2017-11-23 MED ORDER — BACLOFEN 5 MG HALF TABLET
5.0000 mg | ORAL_TABLET | Freq: Three times a day (TID) | ORAL | Status: DC
  Administered 2017-11-24 – 2017-11-29 (×17): 5 mg via ORAL
  Filled 2017-11-23 (×17): qty 1

## 2017-11-23 SURGICAL SUPPLY — 109 items
APPLICATOR TIP COSEAL (VASCULAR PRODUCTS) ×9 IMPLANT
BAG DECANTER FOR FLEXI CONT (MISCELLANEOUS) ×3 IMPLANT
BANDAGE ACE 4X5 VEL STRL LF (GAUZE/BANDAGES/DRESSINGS) ×3 IMPLANT
BANDAGE ELASTIC 4 VELCRO ST LF (GAUZE/BANDAGES/DRESSINGS) ×3 IMPLANT
BANDAGE ELASTIC 6 VELCRO ST LF (GAUZE/BANDAGES/DRESSINGS) ×3 IMPLANT
BASKET HEART (ORDER IN 25'S) (MISCELLANEOUS) ×1
BASKET HEART (ORDER IN 25S) (MISCELLANEOUS) ×2 IMPLANT
BLADE STERNUM SYSTEM 6 (BLADE) ×3 IMPLANT
BLADE SURG 11 STRL SS (BLADE) ×3 IMPLANT
BNDG GAUZE ELAST 4 BULKY (GAUZE/BANDAGES/DRESSINGS) ×3 IMPLANT
CANISTER SUCT 3000ML PPV (MISCELLANEOUS) ×3 IMPLANT
CATH HEART VENT LEFT (CATHETERS) ×2 IMPLANT
CATH ROBINSON RED A/P 18FR (CATHETERS) ×6 IMPLANT
CATH THORACIC 28FR (CATHETERS) ×3 IMPLANT
CATH THORACIC 36FR (CATHETERS) ×3 IMPLANT
CATH THORACIC 36FR RT ANG (CATHETERS) ×3 IMPLANT
CLIP VESOCCLUDE MED 24/CT (CLIP) IMPLANT
CLIP VESOCCLUDE SM WIDE 24/CT (CLIP) ×3 IMPLANT
CONN ST 1/4X3/8  BEN (MISCELLANEOUS) ×1
CONN ST 1/4X3/8 BEN (MISCELLANEOUS) ×2 IMPLANT
CRADLE DONUT ADULT HEAD (MISCELLANEOUS) ×3 IMPLANT
DRAPE CARDIOVASCULAR INCISE (DRAPES) ×2
DRAPE SLUSH/WARMER DISC (DRAPES) ×3 IMPLANT
DRAPE SRG 135X102X78XABS (DRAPES) ×2 IMPLANT
DRSG AQUACEL AG ADV 3.5X14 (GAUZE/BANDAGES/DRESSINGS) ×3 IMPLANT
DRSG COVADERM 4X14 (GAUZE/BANDAGES/DRESSINGS) ×3 IMPLANT
ELECT CAUTERY BLADE 6.4 (BLADE) ×3 IMPLANT
ELECT REM PT RETURN 9FT ADLT (ELECTROSURGICAL) ×6
ELECTRODE REM PT RTRN 9FT ADLT (ELECTROSURGICAL) ×4 IMPLANT
FELT TEFLON 1X6 (MISCELLANEOUS) ×6 IMPLANT
GAUZE SPONGE 4X4 12PLY STRL (GAUZE/BANDAGES/DRESSINGS) ×6 IMPLANT
GLOVE BIO SURGEON STRL SZ 6 (GLOVE) ×6 IMPLANT
GLOVE BIO SURGEON STRL SZ 6.5 (GLOVE) ×12 IMPLANT
GLOVE BIO SURGEON STRL SZ7 (GLOVE) IMPLANT
GLOVE BIO SURGEON STRL SZ7.5 (GLOVE) IMPLANT
GLOVE BIOGEL PI IND STRL 6 (GLOVE) ×2 IMPLANT
GLOVE BIOGEL PI IND STRL 6.5 (GLOVE) ×2 IMPLANT
GLOVE BIOGEL PI IND STRL 7.0 (GLOVE) ×2 IMPLANT
GLOVE BIOGEL PI INDICATOR 6 (GLOVE) ×1
GLOVE BIOGEL PI INDICATOR 6.5 (GLOVE) ×1
GLOVE BIOGEL PI INDICATOR 7.0 (GLOVE) ×1
GLOVE EUDERMIC 7 POWDERFREE (GLOVE) ×6 IMPLANT
GLOVE ORTHO TXT STRL SZ7.5 (GLOVE) IMPLANT
GOWN STRL REUS W/ TWL LRG LVL3 (GOWN DISPOSABLE) ×8 IMPLANT
GOWN STRL REUS W/ TWL XL LVL3 (GOWN DISPOSABLE) ×2 IMPLANT
GOWN STRL REUS W/TWL LRG LVL3 (GOWN DISPOSABLE) ×4
GOWN STRL REUS W/TWL XL LVL3 (GOWN DISPOSABLE) ×2
GRAFT CV 30X8WVN NDL (Graft) ×2 IMPLANT
GRAFT HEMASHIELD 8MM (Graft) ×1 IMPLANT
HEMOSTAT POWDER SURGIFOAM 1G (HEMOSTASIS) ×9 IMPLANT
HEMOSTAT SURGICEL 2X14 (HEMOSTASIS) ×3 IMPLANT
INSERT FOGARTY 61MM (MISCELLANEOUS) IMPLANT
INSERT FOGARTY XLG (MISCELLANEOUS) IMPLANT
KIT BASIN OR (CUSTOM PROCEDURE TRAY) ×3 IMPLANT
KIT CATH CPB BARTLE (MISCELLANEOUS) ×3 IMPLANT
KIT ROOM TURNOVER OR (KITS) ×3 IMPLANT
KIT SUCTION CATH 14FR (SUCTIONS) ×3 IMPLANT
KIT VASOVIEW HEMOPRO VH 3000 (KITS) ×3 IMPLANT
LINE VENT (MISCELLANEOUS) ×3 IMPLANT
LOOP VESSEL MAXI BLUE (MISCELLANEOUS) ×3 IMPLANT
NS IRRIG 1000ML POUR BTL (IV SOLUTION) ×15 IMPLANT
PACK E OPEN HEART (SUTURE) ×3 IMPLANT
PACK OPEN HEART (CUSTOM PROCEDURE TRAY) ×3 IMPLANT
PAD ARMBOARD 7.5X6 YLW CONV (MISCELLANEOUS) ×6 IMPLANT
PAD ELECT DEFIB RADIOL ZOLL (MISCELLANEOUS) ×3 IMPLANT
PENCIL BUTTON HOLSTER BLD 10FT (ELECTRODE) ×3 IMPLANT
PUNCH AORTIC ROTATE 4.0MM (MISCELLANEOUS) IMPLANT
PUNCH AORTIC ROTATE 4.5MM 8IN (MISCELLANEOUS) ×3 IMPLANT
PUNCH AORTIC ROTATE 5MM 8IN (MISCELLANEOUS) IMPLANT
SEALANT SURG COSEAL 8ML (VASCULAR PRODUCTS) ×3 IMPLANT
SET CARDIOPLEGIA MPS 5001102 (MISCELLANEOUS) ×3 IMPLANT
SPONGE INTESTINAL PEANUT (DISPOSABLE) IMPLANT
SPONGE LAP 18X18 X RAY DECT (DISPOSABLE) IMPLANT
SUT BONE WAX W31G (SUTURE) ×3 IMPLANT
SUT MNCRL AB 4-0 PS2 18 (SUTURE) ×3 IMPLANT
SUT PROLENE 3 0 SH DA (SUTURE) ×3 IMPLANT
SUT PROLENE 3 0 SH1 36 (SUTURE) ×3 IMPLANT
SUT PROLENE 4 0 RB 1 (SUTURE)
SUT PROLENE 4 0 SH DA (SUTURE) IMPLANT
SUT PROLENE 4-0 RB1 .5 CRCL 36 (SUTURE) IMPLANT
SUT PROLENE 5 0 C 1 36 (SUTURE) ×3 IMPLANT
SUT PROLENE 6 0 C 1 30 (SUTURE) ×3 IMPLANT
SUT PROLENE 7 0 BV 1 (SUTURE) IMPLANT
SUT PROLENE 7 0 BV1 MDA (SUTURE) ×6 IMPLANT
SUT PROLENE 8 0 BV175 6 (SUTURE) ×3 IMPLANT
SUT SILK  1 MH (SUTURE)
SUT SILK 1 MH (SUTURE) IMPLANT
SUT STEEL STERNAL CCS#1 18IN (SUTURE) IMPLANT
SUT STEEL SZ 6 DBL 3X14 BALL (SUTURE) IMPLANT
SUT VIC AB 1 CTX 36 (SUTURE) ×2
SUT VIC AB 1 CTX36XBRD ANBCTR (SUTURE) ×4 IMPLANT
SUT VIC AB 2-0 CT1 27 (SUTURE)
SUT VIC AB 2-0 CT1 TAPERPNT 27 (SUTURE) IMPLANT
SUT VIC AB 2-0 CTX 27 (SUTURE) ×3 IMPLANT
SUT VIC AB 3-0 SH 27 (SUTURE)
SUT VIC AB 3-0 SH 27X BRD (SUTURE) IMPLANT
SUT VIC AB 3-0 X1 27 (SUTURE) IMPLANT
SUT VICRYL 4-0 PS2 18IN ABS (SUTURE) IMPLANT
SYSTEM SAHARA CHEST DRAIN ATS (WOUND CARE) ×3 IMPLANT
TAPE CLOTH SURG 4X10 WHT LF (GAUZE/BANDAGES/DRESSINGS) ×6 IMPLANT
TAPE PAPER 2X10 WHT MICROPORE (GAUZE/BANDAGES/DRESSINGS) ×3 IMPLANT
TOWEL GREEN STERILE (TOWEL DISPOSABLE) ×3 IMPLANT
TRAY FOLEY SILVER 16FR TEMP (SET/KITS/TRAYS/PACK) ×3 IMPLANT
TUBE CONNECTING 12X1/4 (SUCTIONS) ×3 IMPLANT
TUBING INSUFFLATION (TUBING) ×3 IMPLANT
UNDERPAD 30X30 (UNDERPADS AND DIAPERS) ×3 IMPLANT
VENT LEFT HEART 12002 (CATHETERS) ×3
WATER STERILE IRR 1000ML POUR (IV SOLUTION) ×6 IMPLANT
YANKAUER SUCT BULB TIP NO VENT (SUCTIONS) ×3 IMPLANT

## 2017-11-23 NOTE — Progress Notes (Signed)
Spoke with Dr. Therisa Doyne w/ cardiology regarding pt not having a beta blocker ordered pre CABG. Pt is 1st case 2/28. Pt has a high alert allergy to metoprolol which is charted to cause angioedema. Per last note from cards, pt could be started on Corlanor post CABG. No new orders given at this time. No BB will be be administered prior to CABG d/t allergy. MD aware.  Jacqlyn Larsen, RN

## 2017-11-23 NOTE — Plan of Care (Signed)
  Progressing Skin Integrity: Risk for impaired skin integrity will decrease 11/23/2017 2125 - Progressing by Netta Corrigan, RN Note Sacral foam clean dry and intact.

## 2017-11-23 NOTE — Progress Notes (Signed)
  Echocardiogram Echocardiogram Transesophageal has been performed.  [name] 11/23/2017, 9:48 AM

## 2017-11-23 NOTE — Anesthesia Procedure Notes (Signed)
Procedure Name: Intubation Date/Time: 11/23/2017 7:35 AM Performed by: Renato Shin, CRNA Pre-anesthesia Checklist: Patient identified, Emergency Drugs available, Suction available and Patient being monitored Patient Re-evaluated:Patient Re-evaluated prior to induction Oxygen Delivery Method: Circle system utilized Preoxygenation: Pre-oxygenation with 100% oxygen Induction Type: IV induction Ventilation: Mask ventilation without difficulty Laryngoscope Size: Miller and 3 Grade View: Grade II Tube type: Subglottic suction tube Tube size: 8.0 mm Number of attempts: 1 Airway Equipment and Method: Stylet Placement Confirmation: ETT inserted through vocal cords under direct vision,  positive ETCO2,  CO2 detector and breath sounds checked- equal and bilateral Secured at: 20 cm Tube secured with: Tape Dental Injury: Teeth and Oropharynx as per pre-operative assessment

## 2017-11-23 NOTE — Anesthesia Procedure Notes (Signed)
Arterial Line Insertion Start/End2/28/2019 6:40 AM, 11/23/2017 6:50 AM Performed by: Renato Shin, CRNA, CRNA  Patient location: Pre-op. Preanesthetic checklist: patient identified, IV checked, site marked, risks and benefits discussed, surgical consent, monitors and equipment checked, pre-op evaluation, timeout performed and anesthesia consent Lidocaine 1% used for infiltration Left, radial was placed Catheter size: 20 G Hand hygiene performed , maximum sterile barriers used  and Seldinger technique used Allen's test indicative of satisfactory collateral circulation Attempts: 2 Procedure performed without using ultrasound guided technique. Following insertion, dressing applied and Biopatch. Post procedure assessment: normal  Post procedure complications: local hematoma. Patient tolerated the procedure well with no immediate complications.

## 2017-11-23 NOTE — Progress Notes (Signed)
RT NOTE:  Cardiac Wean initiated RN turned Precidex back on to help with pt anxiety. Pt tolerating wean better. RR increases to mid 30s-40s,  then back to 12-14 once patient opens eyes. Pt preformed parameters well: NIF -40, VC 659mls, Cuff leak +. ABG below acceptable range. RN waiting for Dr. Cyndia Bent to round before proceeding with extubation. Pt back on PRVC/SIMV support.

## 2017-11-23 NOTE — Anesthesia Postprocedure Evaluation (Signed)
Anesthesia Post Note  Patient: [name]  Procedure(s) Performed: CORONARY ARTERY BYPASS GRAFTING (CABG) x 2, ON PUMP, USING LEFT INTERNAL MAMMARY ARTERY AND RIGHT GREATER SAPHENOUS VEIN HARVESTED ENDOSCOPICALLY (N/A Chest) TRANSESOPHAGEAL ECHOCARDIOGRAM (TEE) (N/A )     Patient location during evaluation: SICU Anesthesia Type: General Level of consciousness: sedated Pain management: pain level controlled Vital Signs Assessment: post-procedure vital signs reviewed and stable Respiratory status: patient remains intubated per anesthesia plan Cardiovascular status: stable Postop Assessment: no apparent nausea or vomiting Anesthetic complications: no    Last Vitals:  Vitals:   11/23/17 0530 11/23/17 1305  BP: 118/72 (!) 94/59  Pulse:  (!) 103  Resp: 14 12  Temp: 36.7 C   SpO2: 95% 100%    Last Pain:  Vitals:   11/23/17 0530  TempSrc: Oral  PainSc: 0-No pain                 Kurstin Dimarzo DANIEL

## 2017-11-23 NOTE — Progress Notes (Signed)
RT NOTE:  Cardiac Rapid wean initiated. Pt did not tolerate wean. RR 38-44, very anxious, biting ETT. Pt follows all commands. Switched back to [employer]. Will attempt again @ later time.

## 2017-11-23 NOTE — Anesthesia Procedure Notes (Signed)
Central Venous Catheter Insertion Performed by: Albertha Ghee, MD, anesthesiologist Start/End2/28/2019 6:30 AM, 11/23/2017 6:42 AM Patient location: Pre-op. Preanesthetic checklist: patient identified, IV checked, site marked, risks and benefits discussed, surgical consent, monitors and equipment checked, pre-op evaluation, timeout performed and anesthesia consent Position: Trendelenburg Lidocaine 1% used for infiltration and patient sedated Hand hygiene performed , maximum sterile barriers used  and Seldinger technique used Catheter size: 8.5 Fr Central line was placed.Sheath introducer Swan type:thermodilation Procedure performed using ultrasound guided technique. Ultrasound Notes:anatomy identified, needle tip was noted to be adjacent to the nerve/plexus identified, no ultrasound evidence of intravascular and/or intraneural injection and image(s) printed for medical record Attempts: 1 Following insertion, line sutured, dressing applied and Biopatch. Post procedure assessment: blood return through all ports, free fluid flow and no air  Patient tolerated the procedure well with no immediate complications.

## 2017-11-23 NOTE — Brief Op Note (Signed)
11/16/2017 - 11/23/2017  9:15 AM  PATIENT:  [name]  57 y.o. female  PRE-OPERATIVE DIAGNOSIS:  1. S/p NSTEMI 2.CAD  POST-OPERATIVE DIAGNOSIS: 1. S/p NSTEMI 2.CAD    PROCEDURE:  TRANSESOPHAGEAL ECHOCARDIOGRAM (TEE), MEDIAN STERNOTOMY for CORONARY ARTERY BYPASS GRAFTING (CABG) x 2 (LIMA to LAD, SVG to OM)  USING LEFT INTERNAL MAMMARY ARTERY AND RIGHT GREATER SAPHENOUS VEIN HARVESTED ENDOSCOPICALLY    SURGEON:  Surgeon(s) and Role:    Gaye Pollack, MD - Primary  PHYSICIAN ASSISTANT: Lars Pinks PA-C  ASSISTANTS: Ara Kussmaul RNFA  ANESTHESIA:   general  EBL:  400 mL   DRAINS: Chest tubes placed in the mediastinal and pleural spaces   COUNTS CORRECT:  YES  DICTATION: .Dragon Dictation  PLAN OF CARE: Admit to inpatient   PATIENT DISPOSITION:  ICU - intubated and hemodynamically stable.   Delay start of Pharmacological VTE agent (>24hrs) due to surgical blood loss or risk of bleeding: yes  BASELINE WEIGHT: 54.9 kg

## 2017-11-23 NOTE — Transfer of Care (Signed)
Immediate Anesthesia Transfer of Care Note  Patient: [name]  Procedure(s) Performed: CORONARY ARTERY BYPASS GRAFTING (CABG) x 2, ON PUMP, USING LEFT INTERNAL MAMMARY ARTERY AND RIGHT GREATER SAPHENOUS VEIN HARVESTED ENDOSCOPICALLY (N/A Chest) TRANSESOPHAGEAL ECHOCARDIOGRAM (TEE) (N/A )  Patient Location: SICU  Anesthesia Type:General  Level of Consciousness: sedated, unresponsive and Patient remains intubated per anesthesia plan  Airway & Oxygen Therapy: Patient remains intubated per anesthesia plan and Patient placed on Ventilator (see vital sign flow sheet for setting)  Post-op Assessment: Report given to RN and Post -op Vital signs reviewed and stable  Post vital signs: Reviewed and stable  Last Vitals:  Vitals:   11/22/17 2355 11/23/17 0530  BP:  118/72  Pulse:    Resp: (!) 24 14  Temp:  36.7 C  SpO2:  95%    Last Pain:  Vitals:   11/23/17 0530  TempSrc: Oral  PainSc: 0-No pain      Patients Stated Pain Goal: 0 (38/32/91 9166)  Complications: No apparent anesthesia complications

## 2017-11-23 NOTE — Progress Notes (Signed)
TRH Signoff Note  Patient was already in the OR for CABG when I came in this morning. I discussed a TCTS team member and was advised that post op, patient's care will be taken over by TCTS. TRH will sign off at this time. Please consult TRH again for any further assistance.  Vernell Leep, MD, FACP, [facility]. [facility] Pager [phone]  If 7PM-7AM, please contact night-coverage [email or web address] Password [facility] 11/23/2017, 11:14 AM

## 2017-11-23 NOTE — Procedures (Signed)
Extubation Procedure Note Pt extubated following Cardiac Rapid Wean.  NIF -40, VC 680mls, Cuff Leak +  Pt extubated to 4L [place]. No Stridor.   Patient Details:   Name: [name] DOB: August 15, 1961 MRN: [id number]   Airway Documentation:     Evaluation  O2 sats: stable throughout Complications: No apparent complications Patient did tolerate procedure well. Bilateral Breath Sounds: Clear   Yes  Marissa Nestle 11/23/2017, 10:19 PM

## 2017-11-24 ENCOUNTER — Inpatient Hospital Stay (HOSPITAL_COMMUNITY): Payer: BLUE CROSS/BLUE SHIELD

## 2017-11-24 ENCOUNTER — Encounter (HOSPITAL_COMMUNITY): Payer: Self-pay | Admitting: Surgery

## 2017-11-24 DIAGNOSIS — I2510 Atherosclerotic heart disease of native coronary artery without angina pectoris: Secondary | ICD-10-CM

## 2017-11-24 LAB — CBC
HCT: 26.9 % — ABNORMAL LOW (ref 36.0–46.0)
HEMATOCRIT: 22.6 % — AB (ref 36.0–46.0)
HEMOGLOBIN: 7.4 g/dL — AB (ref 12.0–15.0)
HEMOGLOBIN: 8.7 g/dL — AB (ref 12.0–15.0)
MCH: 30.1 pg (ref 26.0–34.0)
MCH: 29.4 pg (ref 26.0–34.0)
MCHC: 32.7 g/dL (ref 30.0–36.0)
MCHC: 32.3 g/dL (ref 30.0–36.0)
MCV: 91.9 fL (ref 78.0–100.0)
MCV: 90.9 fL (ref 78.0–100.0)
PLATELETS: 225 10*3/uL (ref 150–400)
Platelets: 235 10*3/uL (ref 150–400)
RBC: 2.46 MIL/uL — ABNORMAL LOW (ref 3.87–5.11)
RBC: 2.96 MIL/uL — AB (ref 3.87–5.11)
RDW: 14.7 % (ref 11.5–15.5)
RDW: 14.4 % (ref 11.5–15.5)
WBC: 7.1 10*3/uL (ref 4.0–10.5)
WBC: 8.9 10*3/uL (ref 4.0–10.5)

## 2017-11-24 LAB — BASIC METABOLIC PANEL
Anion gap: 8 (ref 5–15)
BUN: 10 mg/dL (ref 6–20)
CHLORIDE: 107 mmol/L (ref 101–111)
CO2: 22 mmol/L (ref 22–32)
CREATININE: 0.67 mg/dL (ref 0.44–1.00)
Calcium: 8.2 mg/dL — ABNORMAL LOW (ref 8.9–10.3)
GFR calc non Af Amer: >60 mL/min (ref >60)
Glucose, Bld: 123 mg/dL — ABNORMAL HIGH (ref 65–99)
Potassium: 4.7 mmol/L (ref 3.5–5.1)
Sodium: 137 mmol/L (ref 135–145)

## 2017-11-24 LAB — GLUCOSE, CAPILLARY
GLUCOSE-CAPILLARY: 108 mg/dL — AB (ref 65–99)
Glucose-Capillary: 120 mg/dL — ABNORMAL HIGH (ref 65–99)
Glucose-Capillary: 116 mg/dL — ABNORMAL HIGH (ref 65–99)
Glucose-Capillary: 125 mg/dL — ABNORMAL HIGH (ref 65–99)
Glucose-Capillary: 82 mg/dL (ref 65–99)

## 2017-11-24 LAB — POCT I-STAT, CHEM 8
BUN: 12 mg/dL (ref 6–20)
CHLORIDE: 98 mmol/L — AB (ref 101–111)
CREATININE: 0.7 mg/dL (ref 0.44–1.00)
Calcium, Ion: 1.18 mmol/L (ref 1.15–1.40)
GLUCOSE: 119 mg/dL — AB (ref 65–99)
HEMATOCRIT: 26 % — AB (ref 36.0–46.0)
Hemoglobin: 8.8 g/dL — ABNORMAL LOW (ref 12.0–15.0)
POTASSIUM: 3.6 mmol/L (ref 3.5–5.1)
Sodium: 138 mmol/L (ref 135–145)
TCO2: 26 mmol/L (ref 22–32)

## 2017-11-24 LAB — MAGNESIUM
Magnesium: 2.6 mg/dL — ABNORMAL HIGH (ref 1.7–2.4)
Magnesium: 2.3 mg/dL (ref 1.7–2.4)

## 2017-11-24 LAB — PREPARE RBC (CROSSMATCH)

## 2017-11-24 LAB — CREATININE, SERUM
Creatinine, Ser: 0.71 mg/dL (ref 0.44–1.00)
GFR calc non Af Amer: >60 mL/min (ref >60)

## 2017-11-24 MED ORDER — ALBUMIN HUMAN 5 % IV SOLN
INTRAVENOUS | Status: AC
  Administered 2017-11-24: 12.5 g via INTRAVENOUS
  Filled 2017-11-24: qty 250

## 2017-11-24 MED ORDER — MILRINONE LACTATE IN DEXTROSE 20-5 MG/100ML-% IV SOLN
0.1000 ug/kg/min | INTRAVENOUS | Status: DC
  Administered 2017-11-25: 0.1 ug/kg/min via INTRAVENOUS
  Filled 2017-11-24 (×2): qty 100

## 2017-11-24 MED ORDER — ENOXAPARIN SODIUM 40 MG/0.4ML ~~LOC~~ SOLN
40.0000 mg | Freq: Every day | SUBCUTANEOUS | Status: DC
  Administered 2017-11-24 – 2017-11-28 (×5): 40 mg via SUBCUTANEOUS
  Filled 2017-11-24 (×5): qty 0.4

## 2017-11-24 MED ORDER — POTASSIUM CHLORIDE 10 MEQ/50ML IV SOLN
10.0000 meq | INTRAVENOUS | Status: AC | PRN
  Administered 2017-11-24 – 2017-11-25 (×3): 10 meq via INTRAVENOUS
  Filled 2017-11-24 (×4): qty 50

## 2017-11-24 MED ORDER — FUROSEMIDE 10 MG/ML IJ SOLN
40.0000 mg | Freq: Once | INTRAMUSCULAR | Status: AC
  Administered 2017-11-24: 40 mg via INTRAVENOUS
  Filled 2017-11-24: qty 4

## 2017-11-24 MED ORDER — TRAMADOL HCL 50 MG PO TABS
50.0000 mg | ORAL_TABLET | Freq: Four times a day (QID) | ORAL | Status: DC | PRN
  Administered 2017-11-24 (×2): 50 mg via ORAL
  Filled 2017-11-24 (×2): qty 1

## 2017-11-24 MED ORDER — INSULIN ASPART 100 UNIT/ML ~~LOC~~ SOLN
0.0000 [IU] | SUBCUTANEOUS | Status: DC
  Administered 2017-11-24: 2 [IU] via SUBCUTANEOUS

## 2017-11-24 MED ORDER — SODIUM CHLORIDE 0.9 % IV SOLN
Freq: Once | INTRAVENOUS | Status: AC
  Administered 2017-11-24: 10 mL via INTRAVENOUS

## 2017-11-24 MED ORDER — CEFAZOLIN SODIUM-DEXTROSE 2-4 GM/100ML-% IV SOLN
2.0000 g | Freq: Three times a day (TID) | INTRAVENOUS | Status: AC
  Administered 2017-11-24 – 2017-11-26 (×6): 2 g via INTRAVENOUS
  Filled 2017-11-24 (×6): qty 100

## 2017-11-24 MED ORDER — ALBUMIN HUMAN 5 % IV SOLN
12.5000 g | Freq: Once | INTRAVENOUS | Status: AC
  Administered 2017-11-24: 12.5 g via INTRAVENOUS

## 2017-11-24 NOTE — Progress Notes (Signed)
Spoke with MD Cyndia Bent about pt neuro status, states that he expects some neuro issues due to previous MCA stroke and surgery, ok with pt HR being in 110's, she was tachy during surgery.

## 2017-11-24 NOTE — Progress Notes (Signed)
      [address]       [place] [address]             [phone]     POD # 1 CABG  Resting comfortably  BP 115/69   Pulse (!) 102   Temp 98.8 F (37.1 C) (Oral)   Resp (!) 9   Ht 5\' 2"  (1.575 m)   Wt 133 lb 13.1 oz (60.7 kg)   LMP  (LMP Unknown)   SpO2 99%   BMI 24.48 kg/m   Intake/Output Summary (Last 24 hours) at 11/24/2017 1806 Last data filed at 11/24/2017 1400 Gross per 24 hour  Intake 2488.22 ml  Output 2125 ml  Net 363.22 ml    Hct 26 post transfusion,  K= 3.6- Kcl being given  Remo Lipps C. Roxan Hockey, MD [facility] [phone]

## 2017-11-24 NOTE — Evaluation (Signed)
Physical Therapy Evaluation Patient Details Name: [name] MRN: [id number] DOB: 08-Oct-1960 Today's Date: 11/24/2017   History of Present Illness  Pt is a 57 y.o. female admitted 11/16/17 with NSTEMI; now s/p CABG on 11/23/17. 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 PMH includes pneumonia, arthritis.     Clinical Impression  Pt presents with an overall decrease in functional mobility secondary to above. PTA, pt has been mod indep with transfers to w/c, ambulating with quad cane, and ADLs; lives with husband available for 24/7 support. Has been working with OP neuro PT/OT secondary to residual R-side weakness and expressive aphasia since CVA in 07/2017. Educ on sternal precautions, positioning, and importance of mobility. Today, pt declining OOB mobility beyond general strength assessment secondary to having just returned to bed from recliner and is "afraid of overdoing it" as she feels the MI occurred secondary to working "too hard" with therapies. Significant time spent discussing and educating pt on current condition and importance of mobility, especially if pt hopes to return home. A this point, unsure if pt would benefit from more intensive therapies at d/c or will be able to return home; will follow closely to assess follow-up needs.     Follow Up Recommendations Home health PT(unsure at this point; pending pt progression)    Equipment Recommendations  (TBD)    Recommendations for Other Services OT consult     Precautions / Restrictions Precautions Precautions: Fall;Sternal;Other (comment) Precaution Comments: Verbally reviewed sternal precautions; residual R-side weakness Restrictions Other Position/Activity Restrictions: Sternal      Mobility  Bed Mobility               General bed mobility comments: Pt declining OOB mobility having just gotten back  into bed with RN from sitting in recliner  Transfers                    Ambulation/Gait                Stairs            Wheelchair Mobility    Modified Rankin (Stroke Patients Only)       Balance                                             Pertinent Vitals/Pain Pain Assessment: Faces Faces Pain Scale: Hurts a little bit Pain Location: Sternal incision Pain Descriptors / Indicators: Sore Pain Intervention(s): Monitored during session;Limited activity within patient's tolerance    Home Living Family/patient expects to be discharged to:: Private residence Living Arrangements: Spouse/significant other Available Help at Discharge: Family;Available 24 hours/day Type of Home: House Home Access: Stairs to enter Entrance Stairs-Rails: None Entrance Stairs-Number of Steps: 3 Home Layout: One level Home Equipment: Walker - 2 wheels;Shower seat;Crutches;Wheelchair - [employer] - quad Additional Comments: Husband and other family available 24/7    Prior Function Level of Independence: Needs assistance   Gait / Transfers Assistance Needed: Pt able to ambulate short distances at home and in community with quad cane; continues to have R foot drop. Mod indep with standing transfers into/out of wheelchair  ADL's / Homemaking Assistance Needed: Mod indep with ADLs since d/c from [facility]  Comments: Has splint for R finger/wrist  extension due to hemiparesis     Hand Dominance   Dominant Hand: Right    Extremity/Trunk Assessment   Upper Extremity Assessment Upper Extremity Assessment: RUE deficits/detail RUE Deficits / Details: Shoulder grossly 1/5, elbow flex 1/5, elbow ext 2/5, finger flex/ext 2/5; pt with claw hand requiring assist to extend fingers (husband plans to bring splint from [facility])    Lower Extremity Assessment Lower Extremity Assessment: RLE deficits/detail RLE Deficits / Details: Grossly 3/5 throughout       Communication    Communication: Expressive difficulties  Cognition Arousal/Alertness: Awake/alert Behavior During Therapy: Anxious Overall Cognitive Status: Impaired/Different from baseline Area of Impairment: Problem solving                             Problem Solving: Slow processing;Difficulty sequencing;Requires verbal cues;Requires tactile cues General Comments: Pt anxious regarding current condition. Residual expressive difficulties, specifically with word finding (pt reports this has improved since initial CVA)      General Comments      Exercises     Assessment/Plan    PT Assessment Patient needs continued PT services  PT Problem List Decreased strength;Decreased range of motion;Decreased activity tolerance;Decreased balance;Decreased mobility;Decreased knowledge of use of DME;Cardiopulmonary status limiting activity       PT Treatment Interventions DME instruction;Gait training;Functional mobility training;Therapeutic activities;Therapeutic exercise;Balance training;Stair training;Patient/family education;Wheelchair mobility training;Neuromuscular re-education    PT Goals (Current goals can be found in the Care Plan section)  Acute Rehab PT Goals Patient Stated Goal: "Take my time with therapy and not overdo it" PT Goal Formulation: With patient Time For Goal Achievement: 12/08/17 Potential to Achieve Goals: Good    Frequency Min 3X/week   Barriers to discharge        Co-evaluation               AM-PAC PT "6 Clicks" Daily Activity  Outcome Measure Difficulty turning over in bed (including adjusting bedclothes, sheets and blankets)?: Unable Difficulty moving from lying on back to sitting on the side of the bed? : Unable Difficulty sitting down on and standing up from a chair with arms (e.g., wheelchair, bedside commode, etc,.)?: Unable Help needed moving to and from a bed to chair (including a wheelchair)?: A Little Help needed walking in hospital room?: A  Lot Help needed climbing 3-5 steps with a railing? : Total 6 Click Score: 9    End of Session   Activity Tolerance: Other (comment);Patient limited by fatigue(Limited by anxiety) Patient left: in bed;with call bell/phone within reach;with family/visitor present Nurse Communication: Mobility status PT Visit Diagnosis: Other abnormalities of gait and mobility (R26.89);Other symptoms and signs involving the nervous system (R29.898)    Time: 6073-7106 PT Time Calculation (min) (ACUTE ONLY): 28 min   Charges:   PT Evaluation $PT Eval Moderate Complexity: 1 Mod PT Treatments $Self Care/Home Management: 8-22   PT G Codes:       Mabeline Caras, PT, DPT Acute Rehab Services  Pager: [place] 11/24/2017, 4:16 PM

## 2017-11-24 NOTE — Op Note (Signed)
CARDIOVASCULAR SURGERY OPERATIVE NOTE  11/23/2017  Surgeon:  Gaye Pollack, MD  First Assistant: Lars Pinks, [facility]   Preoperative Diagnosis:  High grade left main and severe multi-vessel coronary artery disease   Postoperative Diagnosis:  Same   Procedure:  1. Median Sternotomy 2. Extracorporeal circulation 3. Right axillary artery cannulation via 8 mm Hemashield graft for cardiopulmonary bypass. 4.   Coronary artery bypass grafting x 2   Left internal mammary graft to the LAD  SVG to OM   4.   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  She was treated with TPA and thrombectomy and went to inpatient rehab until September 14, 2017.  She made some improvement with return of some function in her right lower extremity but very little function in the right upper extremity.  She went back home with her husband after inpatient rehab.  She had a loop recorder placed and has not had any documented atrial fibrillation.  She was admitted on 11/16/2017 with new onset shortness of breath which she says was associated with some pains across her lower abdomen.  She came to the emergency room and was noted to be hypoxemic and was started on oxygen therapy but required BiPAP.  She was diagnosed with bilateral lower lobe pneumonia suspicious for aspiration.  Her troponin was mildly elevated initially at 0.4.  Echocardiogram showed some ST depressions in the anterolateral leads.  Her troponin rose to a peak of 10.5.  She had an echocardiogram showing moderate left ventricular systolic dysfunction with ejection fraction of 35-40%.  There is diffuse  hypokinesis with akinesis in the basal-mid inferior lateral, inferior, and inferoseptal myocardium.  There is grade 2 diastolic dysfunction.  There was mild to moderate mitral regurgitation.  Cardiac catheterization yesterday showed 75% mid to distal left main stenosis with significant calcification.  There was 50% ostial to proximal left circumflex stenosis.  There is 40% ostial LAD to proximal LAD stenosis.  The mid left circumflex was occluded after the first marginal branch.  There was faint collaterals to the distal vessel which appeared fairly small.  The right coronary artery was a small nondominant vessel with 90% proximal stenosis.  Left ventricular ejection fraction was estimated visually to be 25-35%.  Right heart catheterization showed a cardiac index of 2.29 with a PA sat of 62%.  Right heart pressures were normal.  LVEDP was normal.  She has 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  I agree that coronary bypass graft surgery is really the only good option for this patient with calcified distal left main stenosis.  She had a CT scan of the chest done on admission to rule out pulmonary embolism breath and this was negative for PE but did show extensive calcification of the ascending aorta and aortic arch.  This would definitely complicate coronary bypass graft surgery and will likely prevent cannulation and crossclamping of the ascending aorta.  I suspect that she will probably require the cannulation of her axillary artery for cardiopulmonary bypass and performance of her  coronary bypasses on pump with a beating heart.  With her poor ejection fraction I do not think she will tolerate off-pump coronary bypass graft surgery.  Her operative  risk is high due to the combination of her low ejection fraction, calcified aorta, recent stroke with significant residual disability and deconditioning, and she will likely require additional inpatient rehabilitation for recovery postoperatively. I discussed the operative procedure with the patient and her husband including alternatives, benefits and risks; including but not limited to bleeding, blood transfusion, infection, stroke, myocardial infarction, graft failure, heart block requiring a permanent pacemaker, organ dysfunction, and death.  Maebelle Kellogg understands and agrees to proceed.     Preparation:  The patient was seen in the preoperative holding area and the correct patient, correct operation were confirmed with the patient after reviewing the medical record and catheterization. The consent was signed by me. Preoperative antibiotics were given. A pulmonary arterial line and radial arterial line were placed by the anesthesia team. The patient was taken back to the operating room and positioned supine on the operating room table. After being placed under general endotracheal anesthesia by the anesthesia team a foley catheter was placed. The neck, chest, abdomen, and both legs were prepped with betadine soap and solution and draped in the usual sterile manner. A surgical time-out was taken and the correct patient and operative procedure were confirmed with the nursing and anesthesia staff.  TEE: performed by Dr. Tamela Gammon  Echo Findings   Left Ventricle Normal wall thickness, left ventricular diastolic function and left atrial pressure. Cavity is moderately dilated. LV systolic function is moderately to severely reduced with an EF of 30-35%. Wall motion is abnormal. Overall hypokinesis, more pronounced in the basal inferior/septal region No thrombus present. No mass present.  Septum Normal atrial and ventricular septum with no septal defect and normal septal motion. Small Patent Foramen  [place] present with left to right shunt.  Left Atrium Left atrium normal in structure and function.  Aortic Valve Structurally normal trileaflet aortic valve with no regurgitation or stenosis. Moderate valve thickening present. Moderate valve calcification present.  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 comp.) and protruding.  Mitral Valve Normal valve structure. Moderate leaflet thickening is present. Moderate leaflet calcification is present. Normal transmitral gradient. No stenosis. Normal leaflet mobility. Mild to moderate regurgitation. The jet direction is centrally-directed. No prolapse present.  Right Ventricle Normal cavity size, wall thickness and ejection fraction.  Right Atrium Normal right atrial size.  Tricuspid Valve Normal tricuspid valve structure. No stenosis. Trace regurgitation. The tricuspid valve regurgitation jet is central.  Pulmonic Valve Normal pulmonic valve structure. No stenosis. Trace regurgitation.  Pericardium Small pericardial effusion.  Post-Op TEE AORTA Aorta unchanged from pre-bypass.  LEFT VENTRICLE Left ventricle unchanged from pre-bypass.  RIGHT VENTRICLE Right ventricle unchanged from pre-bypass.  AORTIC VALVE Aortic valve unchanged from pre-bypass MITRAL VALVE Mitral valve unchanged from pre-bypass.  TRICUSPID VALVE Tricuspid valve unchanged from pre-bypass.    Cardiopulmonary Bypass:  A median sternotomy was performed. The pericardium was opened in the midline. Right ventricular function appeared normal. The ascending aorta was of normal size and had calcified palpable plaque in the mid to distal ascending aorta and arch. I felt that cannulation of the ascending aorta was contraindicated.  The proximal ascending aorta was soft enough to cross-clamp and still have enough space proximal to the clamp for the proximal anastomosis of the vein graft.   Right axillary  artery exposure and  cannulation:  A transverse incision was made below the right clavicle. The pectoralis major muscle was split along its fibers and the pectoralis minor muscle was retracted laterally. The brachial plexus was identified and gently retracted laterally to expose the axillary artery. The artery was controlled proximally and distally with vessel loops. The patient was fully heparinized and ACT maintained greater than 400. The axillary artery was clamped proximally and distally with peripheral Debakey clamps. It was opened longitudinally. There was no sign of dissection here. An 8 mm Hemashield dacron graft was anastomosed in an end to side manner using continuous 5-0 prolene suture. CoSeal was applied for hemostasis and the clamp removed. The graft was connected to the arterial end of the bypass circuit.   Venous cannulation was performed via the right atrial appendage using a two-staged venous cannula. An antegrade cardioplegia/vent cannula was inserted into the mid-ascending aorta. Aortic occlusion was performed with a single cross-clamp. Systemic cooling to 32 degrees Centigrade and topical cooling of the heart with iced saline were used. Hyperkalemic antegrade cold blood cardioplegia was used to induce diastolic arrest and was then given at about 20 minute intervals throughout the period of arrest to maintain myocardial temperature at or below 10 degrees centigrade. A temperature probe was inserted into the interventricular septum and an insulating pad was placed in the pericardium.   Left internal mammary harvest:  The left side of the sternum was retracted using the Rultract retractor. The left internal mammary artery was harvested as a pedicle graft. All side branches were clipped. It was a medium-sized vessel of good quality with excellent blood flow. It was ligated distally and divided. It was sprayed with topical papaverine solution to prevent vasospasm.   Endoscopic vein harvest:  The right  greater saphenous vein was harvested endoscopically through a 2 cm incision medial to the right knee. It was harvested from the upper thigh to below the knee. It was a medium-sized vein of good quality. The side branches were all ligated with 4-0 silk ties.    Coronary arteries:  The coronary arteries were examined.   LAD: large vessel that was diffusely diseased with plaque but graft-able.  LCX:  Moderate sized OM that was intramyocardial but visible just below the surface of the muscle in the midportion where it was graftable.  RCA: Small nondominant vessel that was not large enough to graft.   Grafts:  1. LIMA to the LAD: 2.0 mm. It was sewn end to side using 8-0 prolene continuous suture. 2. SVG to OM:  1.75 mm. It was sewn end to side using 7-0 prolene continuous suture.   The proximal vein graft anastomosis was performed to the mid-ascending aorta using continuous 6-0 prolene suture. A graft marker was placed around the proximal anastomosis.   Completion:  The patient was rewarmed to 37 degrees Centigrade. The clamp was removed from the LIMA pedicle and there was rapid warming of the septum and return of ventricular fibrillation. The crossclamp was removed with a time of 59 minutes. There was spontaneous return of sinus rhythm. The distal and proximal anastomoses were checked for hemostasis. The position of the grafts was satisfactory. Two temporary epicardial pacing wires were placed on the right atrium and two on the right ventricle. The patient was weaned from CPB without difficulty on Milrinone 0.375 mcg and dopamine 5 mcg. CPB time was 80 minutes. Cardiac output was 5 LPM. Heparin was fully reversed with protamine and the venous cannula removed.  The right axillary artery graft was ligated close to the anastomosis using a heavy silk tie and then divided.  The stump was suture-ligated with a 3-0 Prolene pledgeted horizontal mattress suture.  Hemostasis was achieved. Mediastinal  and left pleural drainage tubes were placed. The sternum was closed with  #6 stainless steel wires. The fascia was closed with continuous # 1 vicryl suture. The subcutaneous tissue was closed with 2-0 vicryl continuous suture. The skin was closed with 3-0 vicryl subcuticular suture.  The right axillary incision was closed in a similar manner.  All sponge, needle, and instrument counts were reported correct at the end of the case. Dry sterile dressings were placed over the incisions and around the chest tubes which were connected to pleurevac suction. The patient was then transported to the surgical intensive care unit in stable condition.

## 2017-11-24 NOTE — Progress Notes (Signed)
 Progress Note  Patient Name: [name] Date of Encounter: 11/24/2017  Primary Cardiologist: No primary care provider on file.   Subjective   Awake and following commands.  Chest soreness noted.  Mild dyspnea due to chest pain with deep breathing.  Inpatient Medications    Scheduled Meds: . acetaminophen  1,000 mg Oral Q6H   Or  . acetaminophen (TYLENOL) oral liquid 160 mg/5 mL  1,000 mg Per Tube Q6H  . aspirin EC  325 mg Oral Daily   Or  . aspirin  324 mg Per Tube Daily  . baclofen  5 mg Oral TID  . bisacodyl  10 mg Oral Daily   Or  . bisacodyl  10 mg Rectal Daily  . carvedilol  3.125 mg Oral BID WC  . chlorhexidine gluconate (MEDLINE KIT)  15 mL Mouth Rinse BID  . docusate sodium  200 mg Oral Daily  . enoxaparin (LOVENOX) injection  40 mg Subcutaneous QHS  . escitalopram  10 mg Oral Daily  . famotidine  20 mg Oral BID  . furosemide  40 mg Intravenous Once  . guaiFENesin  1,200 mg Oral BID  . hydrOXYzine  25 mg Oral QHS  . insulin aspart  0-24 Units Subcutaneous Q4H  . insulin aspart  0-24 Units Subcutaneous Q4H  . insulin regular  0-10 Units Intravenous TID WC  . ipratropium-albuterol  3 mL Nebulization BID  . mouth rinse  15 mL Mouth Rinse QID  . [START ON 11/25/2017] pantoprazole  40 mg Oral Daily  . rosuvastatin  20 mg Oral Daily  . sodium chloride flush  3 mL Intravenous Q12H   Continuous Infusions: . sodium chloride 20 mL/hr at 11/24/17 0600  . sodium chloride    . sodium chloride 20 mL/hr at 11/24/17 0600  . cefUROXime (ZINACEF)  IV Stopped (11/24/17 0301)  . insulin (NOVOLIN-R) infusion 0.7 Units/hr (11/23/17 1400)  . lactated ringers 20 mL/hr at 11/24/17 0600  . lactated ringers    . milrinone 0.2 mcg/kg/min (11/24/17 0739)  . nitroGLYCERIN Stopped (11/24/17 0001)  . phenylephrine (NEO-SYNEPHRINE) Adult infusion Stopped (11/24/17 0430)   PRN Meds: sodium chloride, food thickener, hydrocortisone cream, levalbuterol, morphine injection, ondansetron  (ZOFRAN) IV, oxyCODONE, RESOURCE THICKENUP CLEAR, sodium chloride flush, traMADol   Vital Signs    Vitals:   11/24/17 0630 11/24/17 0645 11/24/17 0700 11/24/17 0736  BP: (!) 98/59  100/66 (!) 134/56  Pulse: (!) 105 (!) 107 (!) 111 (!) 109  Resp: (!) 9 15 14   Temp: 99 F (37.2 C) 99 F (37.2 C) 99 F (37.2 C)   TempSrc:      SpO2: 99% 97% 98%   Weight:      Height:        Intake/Output Summary (Last 24 hours) at 11/24/2017 0806 Last data filed at 11/24/2017 0602 Gross per 24 hour  Intake 5912.27 ml  Output 3690 ml  Net 2222.27 ml   Filed Weights   11/22/17 0121 11/23/17 0530 11/24/17 0500  Weight: 122 lb 9.6 oz (55.6 kg) 121 lb 1.6 oz (54.9 kg) 133 lb 13.1 oz (60.7 kg)    Telemetry    Normal sinus rhythm/sinus tachycardia- Personally Reviewed  ECG    Normal sinus rhythm, prominent voltage, new Q waves in inferior (old inferior Q waves are deeper), diffuse T wave inversion.- Personally Reviewed  Physical Exam   GEN: No acute distress.   Neck: No JVD Cardiac: RRR, no murmurs, or gallops.  Soft pericardial rub.    Respiratory: Clear to auscultation bilaterally. GI: Soft, nontender, non-distended  MS: No edema; No deformity. Neuro:   Decreased speech/ dysarthria, unchanged. Psych: Normal affect   Labs    Chemistry Recent Labs  Lab 11/19/17 0427 11/20/17 0421 11/23/17 0416  11/23/17 1204 11/23/17 1315 11/23/17 1859 11/23/17 1900 11/24/17 0401  NA 139 138 136   < > 136 139 139  --  137  K 4.1 4.3 4.0   < > 4.4 3.8 4.1  --  4.7  CL 101 101 103   < > 101  --  105  --  107  CO2 27 25 24  --   --   --   --   --  22  GLUCOSE 80 95 100*   < > 111* 122* 117*  --  123*  BUN 24* 23* 18   < > 12  --  10  --  10  CREATININE 0.73 0.80 0.78   < > 0.50  --  0.50 0.66 0.67  CALCIUM 9.3 9.3 9.1  --   --   --   --   --  8.2*  PROT 7.2  --   --   --   --   --   --   --   --   ALBUMIN 3.4*  --   --   --   --   --   --   --   --   AST 31  --   --   --   --   --   --   --   --    ALT 19  --   --   --   --   --   --   --   --   ALKPHOS 65  --   --   --   --   --   --   --   --   BILITOT 0.8  --   --   --   --   --   --   --   --   GFRNONAA >60 >60 >60  --   --   --   --  >60 >60  GFRAA >60 >60 >60  --   --   --   --  >60 >60  ANIONGAP 11 12 9  --   --   --   --   --  8   < > = values in this interval not displayed.     Hematology Recent Labs  Lab 11/23/17 1307  11/23/17 1859 11/23/17 1900 11/24/17 0401  WBC 15.3*  --   --  10.2 7.1  RBC 3.05*  --   --  2.52* 2.46*  HGB 8.8*   < > 7.1* 7.4* 7.4*  HCT 27.6*   < > 21.0* 22.9* 22.6*  MCV 90.5  --   --  90.9 91.9  MCH 28.9  --   --  29.4 30.1  MCHC 31.9  --   --  32.3 32.7  RDW 13.9  --   --  14.1 14.7  PLT 241  --   --  224 235   < > = values in this interval not displayed.    Cardiac EnzymesNo results for input(s): TROPONINI in the last 168 hours. No results for input(s): TROPIPOC in the last 168 hours.   BNP Recent Labs  Lab 11/18/17 0350  BNP 1,276.7*     DDimer No results for input(s): DDIMER   in the last 168 hours.   Radiology    Dg Chest 2 View  Result Date: 11/22/2017 CLINICAL DATA:  Pneumonia. EXAM: CHEST  2 VIEW COMPARISON:  Radiograph of November 17, 2017. FINDINGS: Stable cardiomegaly. No pneumothorax or pleural effusion is noted. Right basilar opacity noted on prior exam appears to have resolved. No acute pulmonary disease is noted. Bony thorax is unremarkable. IMPRESSION: No active cardiopulmonary disease. Electronically Signed   By: Marijo Conception, M.D.   On: 11/22/2017 11:47   Dg Chest Port 1 View  Result Date: 11/24/2017 CLINICAL DATA:  Status post CABG EXAM: PORTABLE CHEST 1 VIEW COMPARISON:  Yesterday FINDINGS: Increased cardiopericardial enlargement and vascular pedicle widening. Swan-Ganz catheter from right IJ approach with tip at the main pulmonary artery. Thoracic drains in place. Left-sided drain has a more horizontal appearance and the epigastric drain is shorter. Small  pleural effusions and atelectasis. IMPRESSION: 1. Lower volumes after extubation. Increased cardiopericardial size which may be related. 2. New pulmonary edema. 3. Shorter thoracic drains. 4. No visible pneumothorax. Electronically Signed   By: Monte Fantasia M.D.   On: 11/24/2017 07:49   Dg Chest Port 1 View  Result Date: 11/23/2017 CLINICAL DATA:  Status post CABG. EXAM: PORTABLE CHEST 1 VIEW COMPARISON:  11/22/2017. FINDINGS: 1256 hours. Endotracheal tube tip is 3.3 cm above the base of the carina. The NG tube passes into the stomach although the distal tip position is not included on the film. To midline mediastinal/pericardial drains evident. Left chest tube noted without left-sided pneumothorax. Retrocardiac atelectasis evident. No evidence for pulmonary edema or substantial pleural effusion. Right IJ pulmonary artery catheter tip is in the main pulmonary artery. Loop recorder overlies the left heart. Telemetry leads overlie the chest. IMPRESSION: Status post CABG with left base atelectasis. Support apparatus as above. Electronically Signed   By: Misty Stanley M.D.   On: 11/23/2017 13:35    Cardiac Studies   No new data  Patient Profile     57 y.o. 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  Coronary angiography demonstrated nondominant RCA with severe distal left main and moderate to moderately severe proximal circumflex and mid LAD disease.  Total occlusion of mid to distal circumflex.   Received LIMA to LAD and SVG to circumflex for left main disease.   Assessment & Plan    1. CAD presenting with unstable angina/non-ST elevation MI treated with coronary artery bypass grafting with LIMA to LAD and SVG to circumflex. 2. Acute on chronic systolic heart failure, ischemically mediated.  Guideline directed therapy for LV  systolic dysfunction as tolerated by vital signs and kidney function.  Would not start renin/angiotensin blockade until volume status is optimal and kidney function stable.  Overall good outcome so far.  We will follow peripherally.  For questions or updates, please contact [facility] Please consult [email or web address] for contact info under Cardiology/STEMI.      Signed, Sinclair Grooms, MD  11/24/2017, 8:06 AM

## 2017-11-24 NOTE — Progress Notes (Signed)
Report received from [facility] at bedside. Eleonore Chiquito RN

## 2017-11-24 NOTE — Progress Notes (Signed)
1 Day Post-Op Procedure(s) (LRB): CORONARY ARTERY BYPASS GRAFTING (CABG) x 2, ON PUMP, USING LEFT INTERNAL MAMMARY ARTERY AND RIGHT GREATER SAPHENOUS VEIN HARVESTED ENDOSCOPICALLY (N/A) TRANSESOPHAGEAL ECHOCARDIOGRAM (TEE) (N/A) Subjective: Says she is mixed up  Objective: Vital signs in last 24 hours: Temp:  [95.7 F (35.4 C)-99.3 F (37.4 C)] 99 F (37.2 C) (03/01 0700) Pulse Rate:  [60-123] 109 (03/01 0736) Cardiac Rhythm: Sinus tachycardia;Normal sinus rhythm (03/01 0400) Resp:  [9-32] 14 (03/01 0700) BP: (68-134)/(47-83) 134/56 (03/01 0736) SpO2:  [95 %-100 %] 98 % (03/01 0700) Arterial Line BP: (83-159)/(43-70) 119/54 (03/01 0700) FiO2 (%):  [40 %-50 %] 40 % (02/28 2047) Weight:  [60.7 kg (133 lb 13.1 oz)] 60.7 kg (133 lb 13.1 oz) (03/01 0500)  Hemodynamic parameters for last 24 hours: PAP: (11-35)/(5-19) 27/15 CO:  [3 L/min-4.7 L/min] 4.3 L/min CI:  [1.9 L/min/m2-3.1 L/min/m2] 2.8 L/min/m2  Intake/Output from previous day: 02/28 0701 - 03/01 0700 In: 5912.3 [I.V.:4012.3; Blood:200; IV Piggyback:1700] Out: 3690 [Urine:2775; Blood:400; Chest Tube:515] Intake/Output this shift: No intake/output data recorded.  General appearance: alert and cooperative Neurologic: right sided weakness Heart: regular rate and rhythm, S1, S2 normal, no murmur, click, rub or gallop Lungs: diminished breath sounds bibasilar Extremities: edema mild Wound: dressing dry  Lab Results: Recent Labs    11/23/17 1900 11/24/17 0401  WBC 10.2 7.1  HGB 7.4* 7.4*  HCT 22.9* 22.6*  PLT 224 235   BMET:  Recent Labs    11/23/17 0416  11/23/17 1859 11/23/17 1900 11/24/17 0401  NA 136   < > 139  --  137  K 4.0   < > 4.1  --  4.7  CL 103   < > 105  --  107  CO2 24  --   --   --  22  GLUCOSE 100*   < > 117*  --  123*  BUN 18   < > 10  --  10  CREATININE 0.78   < > 0.50 0.66 0.67  CALCIUM 9.1  --   --   --  8.2*   < > = values in this interval not displayed.    PT/INR:  Recent Labs   11/23/17 1307  LABPROT 15.8*  INR 1.27   ABG    Component Value Date/Time   PHART 7.341 (L) 11/23/2017 2320   HCO3 23.7 11/23/2017 2320   TCO2 25 11/23/2017 2320   ACIDBASEDEF 2.0 11/23/2017 2320   O2SAT 97.0 11/23/2017 2320   CBG (last 3)  Recent Labs    11/23/17 1647 11/23/17 1753 11/23/17 2318  GLUCAP 105* 111* 138*   CXR: bibasilar atelectasis.  Assessment/Plan: S/P Procedure(s) (LRB): CORONARY ARTERY BYPASS GRAFTING (CABG) x 2, ON PUMP, USING LEFT INTERNAL MAMMARY ARTERY AND RIGHT GREATER SAPHENOUS VEIN HARVESTED ENDOSCOPICALLY (N/A) TRANSESOPHAGEAL ECHOCARDIOGRAM (TEE) (N/A)  POD 1 She is hemodynamically stable on Milrinone 0.3 with CI 2.8. Will decrease to 0.2. Preop EF 31% in OR TEE. No beta blocker due to allergy. Expected acute postop blood loss anemia: Will transfuse a unit of PRBC's today. Diuresis Diabetes control: glucose under good control: preop Hgb A1c 5.6. Continue SSI for now. d/c tubes later today. DC swan. Continue foley due to diuresing patient, patient in ICU and urinary output monitoring PT consult. She has significant chronic disability from her stroke in November and has been in bed for the past week. See progression orders   LOS: 8 days    Gaye Pollack 11/24/2017

## 2017-11-25 ENCOUNTER — Inpatient Hospital Stay (HOSPITAL_COMMUNITY): Payer: BLUE CROSS/BLUE SHIELD

## 2017-11-25 LAB — BPAM RBC
BLOOD PRODUCT EXPIRATION DATE: 201903222359
ISSUE DATE / TIME: 201903010748
UNIT TYPE AND RH: 7300

## 2017-11-25 LAB — GLUCOSE, CAPILLARY
GLUCOSE-CAPILLARY: 89 mg/dL (ref 65–99)
GLUCOSE-CAPILLARY: 98 mg/dL (ref 65–99)
Glucose-Capillary: 135 mg/dL — ABNORMAL HIGH (ref 65–99)
Glucose-Capillary: 94 mg/dL (ref 65–99)

## 2017-11-25 LAB — BASIC METABOLIC PANEL
ANION GAP: 8 (ref 5–15)
BUN: 10 mg/dL (ref 6–20)
CALCIUM: 8.4 mg/dL — AB (ref 8.9–10.3)
CO2: 27 mmol/L (ref 22–32)
Chloride: 98 mmol/L — ABNORMAL LOW (ref 101–111)
Creatinine, Ser: 0.62 mg/dL (ref 0.44–1.00)
GFR calc Af Amer: >60 mL/min (ref >60)
GFR calc non Af Amer: >60 mL/min (ref >60)
Glucose, Bld: 135 mg/dL — ABNORMAL HIGH (ref 65–99)
Potassium: 3.6 mmol/L (ref 3.5–5.1)
Sodium: 133 mmol/L — ABNORMAL LOW (ref 135–145)

## 2017-11-25 LAB — TYPE AND SCREEN
ABO/RH(D): B POS
Antibody Screen: NEGATIVE
UNIT DIVISION: 0

## 2017-11-25 LAB — CBC
HEMATOCRIT: 26.3 % — AB (ref 36.0–46.0)
HEMOGLOBIN: 8.4 g/dL — AB (ref 12.0–15.0)
MCH: 29.5 pg (ref 26.0–34.0)
MCHC: 31.9 g/dL (ref 30.0–36.0)
MCV: 92.3 fL (ref 78.0–100.0)
Platelets: 208 10*3/uL (ref 150–400)
RBC: 2.85 MIL/uL — ABNORMAL LOW (ref 3.87–5.11)
RDW: 14.6 % (ref 11.5–15.5)
WBC: 9.5 10*3/uL (ref 4.0–10.5)

## 2017-11-25 LAB — COOXEMETRY PANEL
Carboxyhemoglobin: 1.1 % (ref 0.5–1.5)
Methemoglobin: 1.3 % (ref 0.0–1.5)
O2 Saturation: 68.8 %
TOTAL HEMOGLOBIN: 8.6 g/dL — AB (ref 12.0–16.0)

## 2017-11-25 MED ORDER — POTASSIUM CHLORIDE ER 10 MEQ PO TBCR
20.0000 meq | EXTENDED_RELEASE_TABLET | Freq: Every day | ORAL | Status: DC
  Administered 2017-11-25 – 2017-11-26 (×2): 20 meq via ORAL
  Filled 2017-11-25 (×5): qty 2

## 2017-11-25 MED ORDER — POTASSIUM CHLORIDE 10 MEQ/50ML IV SOLN
10.0000 meq | INTRAVENOUS | Status: AC
  Administered 2017-11-25 (×2): 10 meq via INTRAVENOUS
  Filled 2017-11-25 (×2): qty 50

## 2017-11-25 MED ORDER — FUROSEMIDE 40 MG PO TABS
40.0000 mg | ORAL_TABLET | Freq: Every day | ORAL | Status: DC
  Administered 2017-11-25 – 2017-11-29 (×5): 40 mg via ORAL
  Filled 2017-11-25 (×5): qty 1

## 2017-11-25 MED ORDER — INSULIN ASPART 100 UNIT/ML ~~LOC~~ SOLN
0.0000 [IU] | Freq: Three times a day (TID) | SUBCUTANEOUS | Status: DC
  Administered 2017-11-25: 2 [IU] via SUBCUTANEOUS

## 2017-11-25 NOTE — Progress Notes (Signed)
2 Days Post-Op Procedure(s) (LRB): CORONARY ARTERY BYPASS GRAFTING (CABG) x 2, ON PUMP, USING LEFT INTERNAL MAMMARY ARTERY AND RIGHT GREATER SAPHENOUS VEIN HARVESTED ENDOSCOPICALLY (N/A) TRANSESOPHAGEAL ECHOCARDIOGRAM (TEE) (N/A) Subjective: C/o soreness in chest  Objective: Vital signs in last 24 hours: Temp:  [97.9 F (36.6 C)-99.3 F (37.4 C)] (P) 98.4 F (36.9 C) (03/02 0837) Pulse Rate:  [94-117] 104 (03/02 0837) Cardiac Rhythm: Normal sinus rhythm (03/02 0400) Resp:  [8-24] 17 (03/02 0700) BP: (103-141)/(59-88) 119/80 (03/02 0837) SpO2:  [94 %-99 %] 94 % (03/02 0700) Arterial Line BP: (140-167)/(62-76) 167/66 (03/01 1500) Weight:  [134 lb 11.2 oz (61.1 kg)] 134 lb 11.2 oz (61.1 kg) (03/02 0600)  Hemodynamic parameters for last 24 hours: PAP: (31-36)/(22-23) 31/22  Intake/Output from previous day: 03/01 0701 - 03/02 0700 In: 2802.3 [P.O.:1080; I.V.:687.3; Blood:335; IV MVHQIONGE:952] Out: 8413 [Urine:2420; Chest Tube:120] Intake/Output this shift: Total I/O In: -  Out: 75 [Urine:75]  General appearance: alert, cooperative and no distress Neurologic: right sided weakness Heart: regular rate and rhythm Lungs: diminished breath sounds bibasilar and L>R Abdomen: normal findings: soft, non-tender  Lab Results: Recent Labs    11/24/17 1617 11/24/17 1619 11/25/17 0424  WBC 8.9  --  9.5  HGB 8.7* 8.8* 8.4*  HCT 26.9* 26.0* 26.3*  PLT 225  --  208   BMET:  Recent Labs    11/24/17 0401  11/24/17 1619 11/25/17 0424  NA 137  --  138 133*  K 4.7  --  3.6 3.6  CL 107  --  98* 98*  CO2 22  --   --  27  GLUCOSE 123*  --  119* 135*  BUN 10  --  12 10  CREATININE 0.67   < > 0.70 0.62  CALCIUM 8.2*  --   --  8.4*   < > = values in this interval not displayed.    PT/INR:  Recent Labs    11/23/17 1307  LABPROT 15.8*  INR 1.27   ABG    Component Value Date/Time   PHART 7.341 (L) 11/23/2017 2320   HCO3 23.7 11/23/2017 2320   TCO2 26 11/24/2017 1619   ACIDBASEDEF 2.0 11/23/2017 2320   O2SAT 68.8 11/25/2017 0430   CBG (last 3)  Recent Labs    11/24/17 2011 11/25/17 0011 11/25/17 0840  GLUCAP 82 98 89    Assessment/Plan: S/P Procedure(s) (LRB): CORONARY ARTERY BYPASS GRAFTING (CABG) x 2, ON PUMP, USING LEFT INTERNAL MAMMARY ARTERY AND RIGHT GREATER SAPHENOUS VEIN HARVESTED ENDOSCOPICALLY (N/A) TRANSESOPHAGEAL ECHOCARDIOGRAM (TEE) (N/A) - CV- in SR. Co-ox 68- will decrease milrinone to 0.1  RESP- IS for atelectasis  RENAL- lytes and creatinine Ok  ENDO- CBG well controlled, change to AC/HS  Advance diet  Mobilize- PT consulted   LOS: 9 days    [name] 11/25/2017

## 2017-11-25 NOTE — Progress Notes (Signed)
      [address]       [place], [address]             [phone]      Stable day  BP 108/73   Pulse (!) 101   Temp 97.8 F (36.6 C) (Oral)   Resp (!) 21   Ht 5\' 2"  (1.575 m)   Wt 134 lb 11.2 oz (61.1 kg)   LMP  (LMP Unknown)   SpO2 91%   BMI 24.64 kg/m   Intake/Output Summary (Last 24 hours) at 11/25/2017 1804 Last data filed at 11/25/2017 1745 Gross per 24 hour  Intake 1019.2 ml  Output 2255 ml  Net -1235.8 ml   CBG OK  No PM labs  Remo Lipps C. Roxan Hockey, MD [facility] [phone]

## 2017-11-26 ENCOUNTER — Inpatient Hospital Stay (HOSPITAL_COMMUNITY): Payer: BLUE CROSS/BLUE SHIELD

## 2017-11-26 LAB — CBC
HCT: 27.4 % — ABNORMAL LOW (ref 36.0–46.0)
Hemoglobin: 8.9 g/dL — ABNORMAL LOW (ref 12.0–15.0)
MCH: 30.3 pg (ref 26.0–34.0)
MCHC: 32.5 g/dL (ref 30.0–36.0)
MCV: 93.2 fL (ref 78.0–100.0)
PLATELETS: 232 10*3/uL (ref 150–400)
RBC: 2.94 MIL/uL — ABNORMAL LOW (ref 3.87–5.11)
RDW: 14.6 % (ref 11.5–15.5)
WBC: 9.2 10*3/uL (ref 4.0–10.5)

## 2017-11-26 LAB — COMPREHENSIVE METABOLIC PANEL
ALT: 14 U/L (ref 14–54)
ANION GAP: 9 (ref 5–15)
AST: 23 U/L (ref 15–41)
Albumin: 3.2 g/dL — ABNORMAL LOW (ref 3.5–5.0)
Alkaline Phosphatase: 45 U/L (ref 38–126)
BUN: 8 mg/dL (ref 6–20)
CHLORIDE: 96 mmol/L — AB (ref 101–111)
CO2: 26 mmol/L (ref 22–32)
Calcium: 8.1 mg/dL — ABNORMAL LOW (ref 8.9–10.3)
Creatinine, Ser: 0.72 mg/dL (ref 0.44–1.00)
GFR calc Af Amer: >60 mL/min (ref >60)
Glucose, Bld: 141 mg/dL — ABNORMAL HIGH (ref 65–99)
Potassium: 3.6 mmol/L (ref 3.5–5.1)
SODIUM: 131 mmol/L — AB (ref 135–145)
Total Bilirubin: 0.5 mg/dL (ref 0.3–1.2)
Total Protein: 5.7 g/dL — ABNORMAL LOW (ref 6.5–8.1)

## 2017-11-26 LAB — COOXEMETRY PANEL
CARBOXYHEMOGLOBIN: 1.3 % (ref 0.5–1.5)
Methemoglobin: 1.3 % (ref 0.0–1.5)
O2 SAT: 57.2 %
TOTAL HEMOGLOBIN: 9.6 g/dL — AB (ref 12.0–16.0)

## 2017-11-26 LAB — GLUCOSE, CAPILLARY
GLUCOSE-CAPILLARY: 82 mg/dL (ref 65–99)
GLUCOSE-CAPILLARY: 90 mg/dL (ref 65–99)
GLUCOSE-CAPILLARY: 115 mg/dL — AB (ref 65–99)
Glucose-Capillary: 180 mg/dL — ABNORMAL HIGH (ref 65–99)

## 2017-11-26 MED ORDER — POTASSIUM CHLORIDE 10 MEQ/50ML IV SOLN
10.0000 meq | INTRAVENOUS | Status: AC
  Administered 2017-11-26 (×3): 10 meq via INTRAVENOUS
  Filled 2017-11-26 (×3): qty 50

## 2017-11-26 NOTE — Progress Notes (Signed)
3 Days Post-Op Procedure(s) (LRB): CORONARY ARTERY BYPASS GRAFTING (CABG) x 2, ON PUMP, USING LEFT INTERNAL MAMMARY ARTERY AND RIGHT GREATER SAPHENOUS VEIN HARVESTED ENDOSCOPICALLY (N/A) TRANSESOPHAGEAL ECHOCARDIOGRAM (TEE) (N/A) Subjective: Feeling a little better this AM Pain is easing off ambulated  Objective: Vital signs in last 24 hours: Temp:  [97.8 F (36.6 C)-98.7 F (37.1 C)] 98.7 F (37.1 C) (03/03 0800) Pulse Rate:  [92-103] 103 (03/03 0900) Cardiac Rhythm: Sinus tachycardia (03/03 0800) Resp:  [8-25] 16 (03/03 0900) BP: (94-133)/(51-98) 111/98 (03/03 0900) SpO2:  [87 %-99 %] 97 % (03/03 0900) Weight:  [134 lb 11.2 oz (61.1 kg)] 134 lb 11.2 oz (61.1 kg) (03/03 0300)  Hemodynamic parameters for last 24 hours:    Intake/Output from previous day: 03/02 0701 - 03/03 0700 In: 5916 [P.O.:720; I.V.:543; IV Piggyback:350] Out: 3846 [Urine:1705; Stool:1] Intake/Output this shift: Total I/O In: 93.6 [I.V.:43.6; IV Piggyback:50] Out: 500 [Urine:500]  General appearance: alert, cooperative and no distress Neurologic: right sided weakness Heart: regular rate and rhythm Lungs: diminished breath sounds bibasilar Abdomen: normal findings: soft, non-tender  Lab Results: Recent Labs    11/25/17 0424 11/26/17 0340  WBC 9.5 9.2  HGB 8.4* 8.9*  HCT 26.3* 27.4*  PLT 208 232   BMET:  Recent Labs    11/25/17 0424 11/26/17 0340  NA 133* 131*  K 3.6 3.6  CL 98* 96*  CO2 27 26  GLUCOSE 135* 141*  BUN 10 8  CREATININE 0.62 0.72  CALCIUM 8.4* 8.1*    PT/INR:  Recent Labs    11/23/17 1307  LABPROT 15.8*  INR 1.27   ABG    Component Value Date/Time   PHART 7.341 (L) 11/23/2017 2320   HCO3 23.7 11/23/2017 2320   TCO2 26 11/24/2017 1619   ACIDBASEDEF 2.0 11/23/2017 2320   O2SAT 57.2 11/26/2017 0350   CBG (last 3)  Recent Labs    11/25/17 2101 11/26/17 0416 11/26/17 0752  GLUCAP 94 82 90    Assessment/Plan: S/P Procedure(s) (LRB): CORONARY ARTERY  BYPASS GRAFTING (CABG) x 2, ON PUMP, USING LEFT INTERNAL MAMMARY ARTERY AND RIGHT GREATER SAPHENOUS VEIN HARVESTED ENDOSCOPICALLY (N/A) TRANSESOPHAGEAL ECHOCARDIOGRAM (TEE) (N/A) -CV- co-ox is 57 on 0.1 of milrinone- will dc and follow  In SR on low dose coreg  RESP- continue IS, diuresis  RENAL- creatinine and lytes OK- continue Po lasix  K= 3.6- PO + IV supplementation  ENDO- CBG normal- dc coverage  Deconditioning- ambulating with assistance  Enoxaparin + SCD for DVT prophylaxis   LOS: 10 days    Melrose Nakayama 11/26/2017

## 2017-11-26 NOTE — Progress Notes (Signed)
      [address]       [place] [address]             [phone]      Stable day  Has ambulated once, getting ready to go again  BP 123/83   Pulse (!) 108   Temp 97.8 F (36.6 C) (Oral)   Resp 18   Ht 5\' 2"  (1.575 m)   Wt 134 lb 11.2 oz (61.1 kg)   LMP  (LMP Unknown)   SpO2 98%   BMI 24.64 kg/m   Intake/Output Summary (Last 24 hours) at 11/26/2017 1645 Last data filed at 11/26/2017 1600 Gross per 24 hour  Intake 1624.2 ml  Output 3451 ml  Net -1826.8 ml   Repeat co-ox in [employer] C. Roxan Hockey, MD [facility] [phone]

## 2017-11-27 LAB — BASIC METABOLIC PANEL
ANION GAP: 9 (ref 5–15)
BUN: 9 mg/dL (ref 6–20)
CALCIUM: 8.1 mg/dL — AB (ref 8.9–10.3)
CO2: 29 mmol/L (ref 22–32)
CREATININE: 0.68 mg/dL (ref 0.44–1.00)
Chloride: 99 mmol/L — ABNORMAL LOW (ref 101–111)
GFR calc Af Amer: >60 mL/min (ref >60)
GFR calc non Af Amer: >60 mL/min (ref >60)
GLUCOSE: 92 mg/dL (ref 65–99)
Potassium: 3.7 mmol/L (ref 3.5–5.1)
Sodium: 137 mmol/L (ref 135–145)

## 2017-11-27 LAB — CBC
HCT: 26.3 % — ABNORMAL LOW (ref 36.0–46.0)
Hemoglobin: 8.4 g/dL — ABNORMAL LOW (ref 12.0–15.0)
MCH: 30.1 pg (ref 26.0–34.0)
MCHC: 31.9 g/dL (ref 30.0–36.0)
MCV: 94.3 fL (ref 78.0–100.0)
PLATELETS: 251 10*3/uL (ref 150–400)
RBC: 2.79 MIL/uL — ABNORMAL LOW (ref 3.87–5.11)
RDW: 14.6 % (ref 11.5–15.5)
WBC: 7.8 10*3/uL (ref 4.0–10.5)

## 2017-11-27 LAB — GLUCOSE, CAPILLARY
GLUCOSE-CAPILLARY: 100 mg/dL — AB (ref 65–99)
GLUCOSE-CAPILLARY: 85 mg/dL (ref 65–99)
Glucose-Capillary: 192 mg/dL — ABNORMAL HIGH (ref 65–99)
Glucose-Capillary: 121 mg/dL — ABNORMAL HIGH (ref 65–99)
Glucose-Capillary: 117 mg/dL — ABNORMAL HIGH (ref 65–99)

## 2017-11-27 LAB — COOXEMETRY PANEL
CARBOXYHEMOGLOBIN: 1.6 % — AB (ref 0.5–1.5)
METHEMOGLOBIN: 0.7 % (ref 0.0–1.5)
O2 SAT: 70 %
TOTAL HEMOGLOBIN: 8.1 g/dL — AB (ref 12.0–16.0)

## 2017-11-27 MED ORDER — OXYCODONE HCL 5 MG PO TABS
5.0000 mg | ORAL_TABLET | ORAL | Status: DC | PRN
  Administered 2017-11-27 – 2017-11-29 (×7): 10 mg via ORAL
  Filled 2017-11-27 (×7): qty 2

## 2017-11-27 MED ORDER — SODIUM CHLORIDE 0.9% FLUSH: 3.0000 mL | INTRAVENOUS | Status: DC | PRN

## 2017-11-27 MED ORDER — SODIUM CHLORIDE 0.9% FLUSH
3.0000 mL | Freq: Two times a day (BID) | INTRAVENOUS | Status: DC
  Administered 2017-11-27: 10 mL via INTRAVENOUS
  Administered 2017-11-27 – 2017-11-29 (×4): 3 mL via INTRAVENOUS

## 2017-11-27 MED ORDER — ASPIRIN EC 325 MG PO TBEC
325.0000 mg | DELAYED_RELEASE_TABLET | Freq: Every day | ORAL | Status: DC
  Administered 2017-11-27 – 2017-11-29 (×3): 325 mg via ORAL
  Filled 2017-11-27 (×3): qty 1

## 2017-11-27 MED ORDER — ONDANSETRON HCL 4 MG PO TABS: 4.0000 mg | ORAL_TABLET | Freq: Four times a day (QID) | ORAL | Status: DC | PRN

## 2017-11-27 MED ORDER — DOCUSATE SODIUM 100 MG PO CAPS
200.0000 mg | ORAL_CAPSULE | Freq: Every day | ORAL | Status: DC
  Administered 2017-11-28 – 2017-11-29 (×2): 200 mg via ORAL
  Filled 2017-11-27 (×3): qty 2

## 2017-11-27 MED ORDER — PANTOPRAZOLE SODIUM 40 MG PO TBEC
40.0000 mg | DELAYED_RELEASE_TABLET | Freq: Every day | ORAL | Status: DC
  Administered 2017-11-27 – 2017-11-29 (×3): 40 mg via ORAL
  Filled 2017-11-27 (×3): qty 1

## 2017-11-27 MED ORDER — SODIUM CHLORIDE 0.9 % IV SOLN: 250.0000 mL | INTRAVENOUS | Status: DC | PRN

## 2017-11-27 MED ORDER — POTASSIUM CHLORIDE ER 10 MEQ PO TBCR
20.0000 meq | EXTENDED_RELEASE_TABLET | Freq: Two times a day (BID) | ORAL | Status: DC
  Administered 2017-11-27 – 2017-11-29 (×5): 20 meq via ORAL
  Filled 2017-11-27 (×9): qty 2

## 2017-11-27 MED ORDER — MOVING RIGHT ALONG BOOK
Freq: Once | Status: AC
  Administered 2017-11-27: 12:00:00
  Filled 2017-11-27: qty 1

## 2017-11-27 MED ORDER — ONDANSETRON HCL 4 MG/2ML IJ SOLN: 4.0000 mg | Freq: Four times a day (QID) | INTRAMUSCULAR | Status: DC | PRN

## 2017-11-27 MED ORDER — TRAMADOL HCL 50 MG PO TABS: 50.0000 mg | ORAL_TABLET | Freq: Four times a day (QID) | ORAL | Status: DC | PRN

## 2017-11-27 MED ORDER — BISACODYL 10 MG RE SUPP: 10.0000 mg | Freq: Every day | RECTAL | Status: DC | PRN

## 2017-11-27 MED ORDER — ORAL CARE MOUTH RINSE
15.0000 mL | Freq: Two times a day (BID) | OROMUCOSAL | Status: DC
  Administered 2017-11-28 – 2017-11-29 (×3): 15 mL via OROMUCOSAL

## 2017-11-27 MED ORDER — BISACODYL 5 MG PO TBEC: 10.0000 mg | DELAYED_RELEASE_TABLET | Freq: Every day | ORAL | Status: DC | PRN

## 2017-11-27 MED ORDER — POTASSIUM CHLORIDE 10 MEQ/50ML IV SOLN
10.0000 meq | INTRAVENOUS | Status: AC
  Administered 2017-11-27 (×3): 10 meq via INTRAVENOUS
  Filled 2017-11-27 (×3): qty 50

## 2017-11-27 MED FILL — Heparin Sodium (Porcine) Inj 1000 Unit/ML: INTRAMUSCULAR | Qty: 30 | Status: AC

## 2017-11-27 MED FILL — Potassium Chloride Inj 2 mEq/ML: INTRAVENOUS | Qty: 40 | Status: AC

## 2017-11-27 MED FILL — Magnesium Sulfate Inj 50%: INTRAMUSCULAR | Qty: 10 | Status: AC

## 2017-11-27 NOTE — Progress Notes (Signed)
OT Evaluation  PTA, pt lived at home with her husband and was modified independent with basic ADL tasks @ wc or cane level and was going to rehab at the neuro outpt center. Given her recent CABG, pt demonstrates a decline in her level of function and requires min A with mobility and mod A with ADL. Feel pt is an excellent CIR candidate to facilitate return to PLOF and DC home with A of supportive family. Will follow acutely to address established goals.   Need to get clarification from Dr. Cyndia Bent on pt's ability to use her [facility] for mobility - nsg aware.   11/27/17 1614  OT Visit Information  Last OT Received On 11/27/17  Assistance Needed +2 (for safety with equipment manangement; longer distance )  History of Present Illness Pt is a 57 y.o. female admitted 11/16/17 with NSTEMI; now s/p CABG on 11/23/17. 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 PMH includes pneumonia, arthritis.   Precautions  Precautions Fall;Sternal;Other (comment)  Precaution Comments Verbally reviewed sternal precautions; residual R-side weakness  Restrictions  Weight Bearing Restrictions Yes (sternal precautions)  Other Position/Activity Restrictions Sternal  Home Living  Family/patient expects to be discharged to: Private residence  Living Arrangements Spouse/significant other  Available Help at Discharge Family;Available 24 hours/day  Type of Home House  Home Access Stairs to enter  Entrance Stairs-Number of Steps 3  Entrance Stairs-Rails None  Home Layout One level  Bathroom Shower/Tub Tub/shower unit;Walk-in Cytogeneticist Yes  Home Equipment Walker - 2 wheels;Crutches;Wheelchair - manual;Cane - quad;Tub bench;BSC  Additional Comments Husband and other family available 24/7  Prior Function  Level of Independence Needs assistance  Gait / Transfers  Assistance Needed Pt able to ambulate short distances at home and in community with quad cane; continues to have R foot drop. Mod indep with standing transfers into/out of wheelchair  ADL's / [place] Needed Mod indep with ADLs since d/c from CIR @ wc or cane level  Comments Has R resting hand splint   Communication  Communication Expressive difficulties  Pain Assessment  Pain Assessment Faces  Faces Pain Scale 2  Pain Location Sternal incision  Pain Descriptors / Indicators Sore  Pain Intervention(s) Limited activity within patient's tolerance  Cognition  Arousal/Alertness Awake/alert  Behavior During Therapy Anxious;Impulsive  Overall Cognitive Status Impaired/Different from baseline  Area of Impairment Problem solving;Safety/judgement;Attention;Memory  Current Attention Level Sustained  Memory Decreased recall of precautions  Safety/Judgement Decreased awareness of safety;Decreased awareness of deficits  Problem Solving Slow processing;Difficulty sequencing;Requires verbal cues;Requires tactile cues  General Comments Pt with tangential conversation throughout.  Easily distracted. Forgetful of R arm.  Education throughout to maintain sternal precautions.  Upper Extremity Assessment  Upper Extremity Assessment RUE deficits/detail  RUE Deficits / Details beginning to move out of synergy pattern into extension however not using functionally; mod flexor tone present  - greater distally  RUE Sensation decreased light touch;decreased proprioception  RUE Coordination decreased fine motor;decreased gross motor  Lower Extremity Assessment  Lower Extremity Assessment Defer to PT evaluation  Cervical / Trunk Assessment  Cervical / Trunk Assessment Other exceptions  Cervical / Trunk Exceptions s/p sternotomy  ADL  Overall ADL's  Needs assistance/impaired  Grooming Minimal assistance;Sitting  Upper Body Bathing Moderate assistance;Sitting  Lower Body Bathing Minimal  assistance;Sit to/from stand  Upper Body Dressing  Moderate assistance  Lower Body Dressing Moderate assistance;Sit to/from Retail buyer Minimal assistance;Stand-pivot  Toileting- Water quality scientist and Hygiene Sit to/from stand;Moderate assistance  Functional mobility during ADLs Minimal assistance;+2 for physical assistance  General ADL Comments Pt requies continued cues for sternal precautions during ADL; vc to edirect attention  Bed Mobility  General bed mobility comments OOB in chair  Transfers  Overall transfer level Needs assistance  Equipment used 1 person hand held assist (EVA walker)  Transfers Sit to/from [employer] Transfers  Sit to Stand Mod assist  General transfer comment States she feels moe "off balance" than normal  Balance  Overall balance assessment Needs assistance  Sitting balance-Leahy Scale Fair  Standing balance-Leahy Scale Poor  OT - End of Session  Equipment Utilized During Treatment Oxygen (2L)  Activity Tolerance Patient tolerated treatment well  Patient left in chair;with call bell/phone within reach;with family/visitor present  Nurse Communication Mobility status  OT Assessment  OT Recommendation/Assessment Patient needs continued OT Services  OT Visit Diagnosis Unsteadiness on feet (R26.81);Other abnormalities of gait and mobility (R26.89);Muscle weakness (generalized) (M62.81);Other symptoms and signs involving cognitive function;Pain;Hemiplegia and hemiparesis  Hemiplegia - Right/Left Right  Hemiplegia - dominant/non-dominant Dominant  Hemiplegia - caused by Cerebral infarction  Pain - part of body (chest/incision)  OT Problem List Decreased strength;Decreased range of motion;Decreased activity tolerance;Impaired balance (sitting and/or standing);Decreased coordination;Decreased cognition;Decreased safety awareness;Decreased knowledge of use of DME or AE;Decreased knowledge of precautions;Cardiopulmonary status limiting  activity;Impaired sensation;Impaired tone;Impaired UE functional use;Pain  OT Plan  OT Frequency (ACUTE ONLY) Min 2X/week  OT Treatment/Interventions (ACUTE ONLY) Self-care/ADL training;Therapeutic exercise;Neuromuscular education;DME and/or AE instruction;Therapeutic activities;Cognitive remediation/compensation;Visual/perceptual remediation/compensation;Patient/family education;Balance training  AM-PAC OT "6 Clicks" Daily Activity Outcome Measure  Help from another person eating meals? 3  Help from another person taking care of personal grooming? 3  Help from another person toileting, which includes using toliet, bedpan, or urinal? 2  Help from another person bathing (including washing, rinsing, drying)? 2  Help from another person to put on and taking off regular upper body clothing? 2  Help from another person to put on and taking off regular lower body clothing? 2  6 Click Score 14  ADL G Code Conversion CK  OT Recommendation  Recommendations for Other Services Rehab consult  Follow Up Recommendations CIR;Supervision/Assistance - 24 hour  OT Equipment None recommended by OT  Individuals Consulted  Consulted and Agree with Results and Recommendations Patient;Family member/caregiver  Family Member Consulted husband  Acute Rehab OT Goals  Patient Stated Goal To go to rehab for a week and then return home with support from spouse.    OT Goal Formulation With patient  Time For Goal Achievement 12/11/17  Potential to Achieve Goals Good  OT Time Calculation  OT Start Time (ACUTE ONLY) 1636  OT Stop Time (ACUTE ONLY) 1655  OT Time Calculation (min) 19 min  OT General Charges  $OT Visit 1 Visit  OT Evaluation  $OT Eval Moderate Complexity 1 Mod  Written Expression  Dominant Hand Right  [facility], OT/L  [phone] 11/27/2017

## 2017-11-27 NOTE — Progress Notes (Signed)
Patient ID: [name], female   DOB: 07-20-1961, 57 y.o.   MRN: [id number] TCTS Evening Rounds: Hemodynamically stable Walked well today. PT has recommended CIR.  Awaiting bed on 4E.

## 2017-11-27 NOTE — Progress Notes (Signed)
Physical Therapy Treatment Patient Details Name: [name] MRN: [id number] DOB: 1961-08-09 Today's Date: 11/27/2017    History of Present Illness Pt is a 57 y.o. female admitted 11/16/17 with NSTEMI; now s/p CABG on 11/23/17. 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 PMH includes pneumonia, arthritis.     PT Comments    Pt performed gait and functional mobility with moderated assistance. PTA patient walking with cane and this is not feasible due to her sternal precautions.  Pt appears to present with exacerbated symptoms on CVA post surgery and would benefit from CIR therapies to return to her home in a more independent state.  Will inform supervising PT of change in recommendations at this time.    Follow Up Recommendations  CIR;Supervision/Assistance - 24 hour     Equipment Recommendations       Recommendations for Other Services OT consult     Precautions / Restrictions Precautions Precautions: Fall;Sternal;Other (comment) Precaution Comments: Verbally reviewed sternal precautions; residual R-side weakness Restrictions Weight Bearing Restrictions: Yes(sternal precautions) Other Position/Activity Restrictions: Sternal    Mobility  Bed Mobility Overal bed mobility: Needs Assistance Bed Mobility: Supine to Sit     Supine to sit: Mod assist;+2 for safety/equipment     General bed mobility comments: Pt with good core strength, assistance to advance LEs to edge of bed.    Transfers Overall transfer level: Needs assistance Equipment used: 4-wheeled walker(EVA walker) Transfers: Sit to/from Stand Sit to Stand: Mod assist;+2 safety/equipment         General transfer comment: Pt required assistance to elevate RUE to platform on EVA walker and min assistance to boost into standing.  Pt required cues to push from her L knee to maintain sternal  precautions.  Cues to move her RUE on and off of platform due to hemparesis.    Ambulation/Gait Ambulation/Gait assistance: Mod assist Ambulation Distance (Feet): 80 Feet(x3 trials, required rest breaks inbetween trials as HR elevated to 126 bpm.  ) Assistive device: 4-wheeled walker(EVA walker) Gait Pattern/deviations: Step-through pattern;Decreased stride length;Decreased dorsiflexion - right;Decreased stance time - right;Trunk flexed;Narrow base of support;Scissoring;Ataxic   Gait velocity interpretation: Below normal speed for age/gender General Gait Details: Pt required cues for R foot clearance and R weight shifting in R stance phase.  Pt at times begins to drag R LE when fatigue sets in.     Stairs            Wheelchair Mobility    Modified Rankin (Stroke Patients Only)       Balance Overall balance assessment: Needs assistance   Sitting balance-Leahy Scale: Fair       Standing balance-Leahy Scale: Poor                              Cognition Arousal/Alertness: Awake/alert Behavior During Therapy: Anxious;Impulsive Overall Cognitive Status: Impaired/Different from baseline Area of Impairment: Problem solving                             Problem Solving: Slow processing;Difficulty sequencing;Requires verbal cues;Requires tactile cues General Comments: Pt with tangential conversation throughout.  Forgetful of R arm.  Education throughout to maintain sternal precautions.      Exercises      General Comments  Pertinent Vitals/Pain Pain Assessment: Faces Faces Pain Scale: Hurts a little bit Pain Location: Sternal incision Pain Descriptors / Indicators: Sore Pain Intervention(s): Monitored during session;Repositioned;Limited activity within patient's tolerance    Home Living                      Prior Function            PT Goals (current goals can now be found in the care plan section) Acute Rehab PT  Goals Patient Stated Goal: To go to rehab for a week and then return home with support from spouse.   Potential to Achieve Goals: Good Progress towards PT goals: Progressing toward goals    Frequency    Min 3X/week      PT Plan Discharge plan needs to be updated    Co-evaluation              AM-PAC PT "6 Clicks" Daily Activity  Outcome Measure                   End of Session Equipment Utilized During Treatment: Gait belt Activity Tolerance: Patient limited by fatigue Patient left: with call bell/phone within reach;with family/visitor present;in chair Nurse Communication: Mobility status PT Visit Diagnosis: Other abnormalities of gait and mobility (R26.89);Other symptoms and signs involving the nervous system (R29.898)     Time: 0388-8280 PT Time Calculation (min) (ACUTE ONLY): 28 min  Charges:  $Gait Training: 8-22 mins $Therapeutic Activity: 8-22 mins                    G Codes:       Governor Rooks, PTA pager [phone]    Cristela Blue 11/27/2017, 4:16 PM

## 2017-11-27 NOTE — Progress Notes (Signed)
4 Days Post-Op Procedure(s) (LRB): CORONARY ARTERY BYPASS GRAFTING (CABG) x 2, ON PUMP, USING LEFT INTERNAL MAMMARY ARTERY AND RIGHT GREATER SAPHENOUS VEIN HARVESTED ENDOSCOPICALLY (N/A) TRANSESOPHAGEAL ECHOCARDIOGRAM (TEE) (N/A) Subjective: No specific complaints  Ambulated around the ICU with rolling walker.  Objective: Vital signs in last 24 hours: Temp:  [97.8 F (36.6 C)-98.7 F (37.1 C)] 98.6 F (37 C) (03/03 2000) Pulse Rate:  [83-108] 89 (03/04 0700) Cardiac Rhythm: Sinus tachycardia (03/03 2000) Resp:  [8-22] 10 (03/04 0700) BP: (76-130)/(56-98) 114/76 (03/04 0700) SpO2:  [96 %-100 %] 100 % (03/04 0700) Weight:  [59.3 kg (130 lb 11.7 oz)] 59.3 kg (130 lb 11.7 oz) (03/03 2000)  Hemodynamic parameters for last 24 hours:    Intake/Output from previous day: 03/03 0701 - 03/04 0700 In: 713.6 [P.O.:480; I.V.:83.6; IV Piggyback:150] Out: 3150 [Urine:3150] Intake/Output this shift: Total I/O In: 50 [IV Piggyback:50] Out: -   General appearance: alert and cooperative Neurologic: right sided weakness unchanged from preop Heart: regular rate and rhythm, S1, S2 normal, no murmur, click, rub or gallop Lungs: clear to auscultation bilaterally Extremities: edema mild Wound: incisions ok  Lab Results: Recent Labs    11/26/17 0340 11/27/17 0326  WBC 9.2 7.8  HGB 8.9* 8.4*  HCT 27.4* 26.3*  PLT 232 251   BMET:  Recent Labs    11/26/17 0340 11/27/17 0326  NA 131* 137  K 3.6 3.7  CL 96* 99*  CO2 26 29  GLUCOSE 141* 92  BUN 8 9  CREATININE 0.72 0.68  CALCIUM 8.1* 8.1*    PT/INR: No results for input(s): LABPROT, INR in the last 72 hours. ABG    Component Value Date/Time   PHART 7.341 (L) 11/23/2017 2320   HCO3 23.7 11/23/2017 2320   TCO2 26 11/24/2017 1619   ACIDBASEDEF 2.0 11/23/2017 2320   O2SAT 70.0 11/27/2017 0340   CBG (last 3)  Recent Labs    11/26/17 0752 11/26/17 1200 11/26/17 1604  GLUCAP 90 180* 115*    Assessment/Plan: S/P  Procedure(s) (LRB): CORONARY ARTERY BYPASS GRAFTING (CABG) x 2, ON PUMP, USING LEFT INTERNAL MAMMARY ARTERY AND RIGHT GREATER SAPHENOUS VEIN HARVESTED ENDOSCOPICALLY (N/A) TRANSESOPHAGEAL ECHOCARDIOGRAM (TEE) (N/A)  Hemodynamically stable POD 4. Milrinone weaned off yesterday and Co-ox 70% this am although it was 57% yesterday am on low dose milrinone. Preop EF 30%.   Diuresed well yesterday but still about 9-10 lbs over preop. Continue diuretic and KCL  Previous stroke with right hemiparesis. Continue PT/OT. Sounds like she is ambulating ok with walker so could likely go home with PT.  DC central line and transfer to 4E.   LOS: 11 days    Gaye Pollack 11/27/2017

## 2017-11-28 ENCOUNTER — Ambulatory Visit: Payer: BLUE CROSS/BLUE SHIELD | Admitting: Physical Therapy

## 2017-11-28 ENCOUNTER — Encounter: Payer: BLUE CROSS/BLUE SHIELD | Admitting: Occupational Therapy

## 2017-11-28 DIAGNOSIS — J181 Lobar pneumonia, unspecified organism: Secondary | ICD-10-CM

## 2017-11-28 DIAGNOSIS — Z951 Presence of aortocoronary bypass graft: Secondary | ICD-10-CM

## 2017-11-28 DIAGNOSIS — I519 Heart disease, unspecified: Secondary | ICD-10-CM

## 2017-11-28 DIAGNOSIS — Z9981 Dependence on supplemental oxygen: Secondary | ICD-10-CM

## 2017-11-28 DIAGNOSIS — I6930 Unspecified sequelae of cerebral infarction: Secondary | ICD-10-CM

## 2017-11-28 DIAGNOSIS — D62 Acute posthemorrhagic anemia: Secondary | ICD-10-CM

## 2017-11-28 DIAGNOSIS — G8918 Other acute postprocedural pain: Secondary | ICD-10-CM

## 2017-11-28 DIAGNOSIS — R000 Tachycardia, unspecified: Secondary | ICD-10-CM

## 2017-11-28 DIAGNOSIS — I5189 Other ill-defined heart diseases: Secondary | ICD-10-CM

## 2017-11-28 DIAGNOSIS — J189 Pneumonia, unspecified organism: Secondary | ICD-10-CM

## 2017-11-28 DIAGNOSIS — Z720 Tobacco use: Secondary | ICD-10-CM

## 2017-11-28 DIAGNOSIS — R739 Hyperglycemia, unspecified: Secondary | ICD-10-CM

## 2017-11-28 MED ORDER — CARVEDILOL 6.25 MG PO TABS
6.2500 mg | ORAL_TABLET | Freq: Two times a day (BID) | ORAL | Status: DC
  Administered 2017-11-28 – 2017-11-29 (×2): 6.25 mg via ORAL
  Filled 2017-11-28 (×2): qty 1

## 2017-11-28 MED FILL — Sodium Chloride IV Soln 0.9%: INTRAVENOUS | Qty: 2000 | Status: AC

## 2017-11-28 MED FILL — Heparin Sodium (Porcine) Inj 1000 Unit/ML: INTRAMUSCULAR | Qty: 30 | Status: AC

## 2017-11-28 MED FILL — Lidocaine HCl IV Inj 20 MG/ML: INTRAVENOUS | Qty: 5 | Status: AC

## 2017-11-28 MED FILL — Heparin Sodium (Porcine) Inj 1000 Unit/ML: INTRAMUSCULAR | Qty: 10 | Status: AC

## 2017-11-28 MED FILL — Electrolyte-R (PH 7.4) Solution: INTRAVENOUS | Qty: 3000 | Status: AC

## 2017-11-28 MED FILL — Mannitol IV Soln 20%: INTRAVENOUS | Qty: 500 | Status: AC

## 2017-11-28 NOTE — Progress Notes (Signed)
Nurse called, patient suffered 6 beat run of VTAC with ambulation.  Recovered with rest.  BP is in the 120s.  Will titrate BB starting with this evenings dose.   Ellwood Handler, PA-C

## 2017-11-28 NOTE — Progress Notes (Signed)
Physical Therapy Treatment Patient Details Name: [name] MRN: [id number] DOB: October 03, 1960 Today's Date: 11/28/2017    History of Present Illness Pt is a 57 y.o. female admitted 11/16/17 with NSTEMI; now s/p CABG on 11/23/17. 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 PMH includes pneumonia, arthritis.     PT Comments    Pt slow and guarded with increased neurological deficits.  Encouraged husband to bring her shoe with AFO to encourage neutral alignment of her R foot during gait training.  Pt has sternal precautions which hinders her abbility to use cane for support.  Plan next session to focus on foot placement with gait training and particular attention to increasing BOS.  CIR remains the most viable option for recovery post acute.     Follow Up Recommendations  CIR;Supervision/Assistance - 24 hour     Equipment Recommendations  (TBD at next venue)    Recommendations for Other Services       Precautions / Restrictions Precautions Precautions: Fall;Sternal;Other (comment) Precaution Comments: Verbally reviewed sternal precautions; residual R-side weakness Restrictions Weight Bearing Restrictions: Yes Other Position/Activity Restrictions: sternal precautions.      Mobility  Bed Mobility               General bed mobility comments: Pt standing with RN leaving commode on arrival.    Transfers Overall transfer level: Needs assistance Equipment used: 4-wheeled walker(EVA walker) Transfers: Sit to/from Stand(Pt only performed stand to sit as she was standing with RN on arrival.  )           General transfer comment: Cues for hand placement on her lap, and cues to address and maintain RUE so it does not wander during transfer.    Ambulation/Gait Ambulation/Gait assistance: Mod assist Ambulation Distance (Feet): 100 Feet(x2 trials ) Assistive  device: 4-wheeled walker(EVA walker) Gait Pattern/deviations: Step-through pattern;Decreased stride length;Decreased dorsiflexion - right;Decreased stance time - right;Trunk flexed;Narrow base of support;Scissoring;Ataxic   Gait velocity interpretation: Below normal speed for age/gender General Gait Details: Pt required cues for R foot clearance and R weight shifting in R stance phase.  Pt at times begins to drag R LE when fatigue sets in.  Pt with supinated R foot and ataxic scissoring.  VCs are not enough to brake down patient gait deviations.     Stairs            Wheelchair Mobility    Modified Rankin (Stroke Patients Only)       Balance Overall balance assessment: Needs assistance   Sitting balance-Leahy Scale: Fair       Standing balance-Leahy Scale: Poor Standing balance comment: reliant on UE support.                            Cognition Arousal/Alertness: Awake/alert Behavior During Therapy: Anxious;Impulsive Overall Cognitive Status: Impaired/Different from baseline Area of Impairment: Problem solving;Safety/judgement;Attention;Memory                   Current Attention Level: Sustained Memory: Decreased recall of precautions   Safety/Judgement: Decreased awareness of safety;Decreased awareness of deficits   Problem Solving: Slow processing;Difficulty sequencing;Requires verbal cues;Requires tactile cues General Comments: Pt with tangential conversation throughout.  Easily distracted. Forgetful of R arm.  Education throughout to maintain sternal precautions.      Exercises  General Comments        Pertinent Vitals/Pain Pain Assessment: No/denies pain    Home Living                      Prior Function            PT Goals (current goals can now be found in the care plan section) Acute Rehab PT Goals Patient Stated Goal: To go to rehab for a week and then return home with support from spouse.   Potential to  Achieve Goals: Good Progress towards PT goals: Progressing toward goals    Frequency    Min 3X/week      PT Plan      Co-evaluation              AM-PAC PT "6 Clicks" Daily Activity  Outcome Measure  Difficulty turning over in bed (including adjusting bedclothes, sheets and blankets)?: Unable Difficulty moving from lying on back to sitting on the side of the bed? : Unable Difficulty sitting down on and standing up from a chair with arms (e.g., wheelchair, bedside commode, etc,.)?: Unable Help needed moving to and from a bed to chair (including a wheelchair)?: A Little Help needed walking in hospital room?: A Lot Help needed climbing 3-5 steps with a railing? : Total 6 Click Score: 1    End of Session Equipment Utilized During Treatment: Gait belt Activity Tolerance: Patient limited by fatigue Patient left: with call bell/phone within reach;with family/visitor present;in chair Nurse Communication: Mobility status PT Visit Diagnosis: Other abnormalities of gait and mobility (R26.89);Other symptoms and signs involving the nervous system (D31.438)     Time: 8875-7972 PT Time Calculation (min) (ACUTE ONLY): 21 min  Charges:  $Gait Training: 8-22 mins                    G Codes:       Governor Rooks, PTA pager [phone]    Cristela Blue 11/28/2017, 3:49 PM

## 2017-11-28 NOTE — Progress Notes (Addendum)
Inpatient Rehabilitation  Per OT/PTA request, patient was screened by Gunnar Fusi for appropriateness for an Inpatient Acute Rehab consult.  At this time we are recommending an Inpatient Rehab consult.  Please order if you are agreeable.    Carmelia Roller., CCC/SLP Admission Coordinator  [facility]  Cell [phone]

## 2017-11-28 NOTE — Progress Notes (Signed)
Patient in bed watching T.V. And had 7 bts. WCT then back to S.R. In the 90's . Patient voice no complaints. Patient has done this earlier today . Cont. To monitor patient and rhythm.

## 2017-11-28 NOTE — Progress Notes (Addendum)
      [address]       [place] [address]             [phone]      5 Days Post-Op Procedure(s) (LRB): CORONARY ARTERY BYPASS GRAFTING (CABG) x 2, ON PUMP, USING LEFT INTERNAL MAMMARY ARTERY AND RIGHT GREATER SAPHENOUS VEIN HARVESTED ENDOSCOPICALLY (N/A) TRANSESOPHAGEAL ECHOCARDIOGRAM (TEE) (N/A) Subjective: Feels good this morning. Does have some abdominal pain in the LL quadrant into the epigastric area.  Objective: Vital signs in last 24 hours: Temp:  [97.9 F (36.6 C)-98.7 F (37.1 C)] 98.6 F (37 C) (03/05 0329) Pulse Rate:  [88-111] 91 (03/05 0824) Cardiac Rhythm: Normal sinus rhythm (03/05 0700) Resp:  [9-23] 22 (03/05 0824) BP: (97-132)/(59-86) 124/77 (03/05 0329) SpO2:  [84 %-100 %] 96 % (03/05 0824) Weight:  [129 lb 6.1 oz (58.7 kg)-130 lb 6.4 oz (59.1 kg)] 129 lb 6.1 oz (58.7 kg) (03/05 0329)     Intake/Output from previous day: 03/04 0701 - 03/05 0700 In: 1010 [P.O.:960; IV Piggyback:50] Out: 1950 [Urine:1950] Intake/Output this shift: No intake/output data recorded.  General appearance: alert, cooperative and no distress Heart: regular rate and rhythm, S1, S2 normal, no murmur, click, rub or gallop Lungs: clear to auscultation bilaterally Abdomen: soft, non-tender Extremities: extremities normal, atraumatic, no cyanosis or edema Wound: clean and dry  Lab Results: Recent Labs    11/26/17 0340 11/27/17 0326  WBC 9.2 7.8  HGB 8.9* 8.4*  HCT 27.4* 26.3*  PLT 232 251   BMET:  Recent Labs    11/26/17 0340 11/27/17 0326  NA 131* 137  K 3.6 3.7  CL 96* 99*  CO2 26 29  GLUCOSE 141* 92  BUN 8 9  CREATININE 0.72 0.68  CALCIUM 8.1* 8.1*    PT/INR: No results for input(s): LABPROT, INR in the last 72 hours. ABG    Component Value Date/Time   PHART 7.341 (L) 11/23/2017 2320   HCO3 23.7 11/23/2017 2320   TCO2 26 11/24/2017 1619   ACIDBASEDEF 2.0 11/23/2017 2320   O2SAT 70.0 11/27/2017 0340   CBG (last 3)  Recent Labs    11/27/17 0730 11/27/17 1214 11/27/17 2239  GLUCAP 85 121* 117*    Assessment/Plan: S/P Procedure(s) (LRB): CORONARY ARTERY BYPASS GRAFTING (CABG) x 2, ON PUMP, USING LEFT INTERNAL MAMMARY ARTERY AND RIGHT GREATER SAPHENOUS VEIN HARVESTED ENDOSCOPICALLY (N/A) TRANSESOPHAGEAL ECHOCARDIOGRAM (TEE) (N/A)  1. CV-NSR in the 90s. BP well controlled. Tolerating Coreg, ASA, and Crestor.  2. Pulm-98% oxygen saturation on 2L. Continue incentive spirometer.  3. Renal-creatinine is 0.68, electrolytes okay. Remains fluid overloaded. Continue diuretic.  4. H and H stable, platelets okay 5. Endo-well controlled blood glucose level. 6. Previous stroke- continue PT/OT. PT recommending CIR.   Plan: Work on ambulation today. Wean oxygen as tolerated. Discontinue epicardial pacing wires. Work on increasing oral intake.    LOS: 12 days    Elgie Collard 11/28/2017   Chart reviewed, patient examined, agree with above.  Weight is continuing to drop with diuresis. I think she can go to CIR when bed available if she is a candidate for that.

## 2017-11-28 NOTE — Plan of Care (Signed)
  Progressing Education: Knowledge of General Education information will improve 11/28/2017 0001 - Progressing by Drenda Freeze, RN Education: Knowledge of General Education information will improve 11/28/2017 0001 - Progressing by Drenda Freeze, RN Health Behavior/Discharge Planning: Ability to manage health-related needs will improve 11/28/2017 0001 - Progressing by Drenda Freeze, RN Clinical Measurements: Ability to maintain clinical measurements within normal limits will improve 11/28/2017 0001 - Progressing by Drenda Freeze, RN

## 2017-11-28 NOTE — Progress Notes (Signed)
CARDIAC REHAB PHASE I   Discussed sternal precautions, IS, diet, smoking cessation and CRPII with pt and husband. Voiced understanding. Pt seems tangitental but overall good reception. She has quit smoking. Gave her resources to review. Will refer to [facility] for "down the road " after her CIR and rehab.  [phone]  [place], ACSM 11/28/2017 11:35 AM

## 2017-11-28 NOTE — Consult Note (Addendum)
Physical Medicine and Rehabilitation Consult Reason for Consult: Decreased functional mobility Referring Physician: Dr. Cyndia Bent   HPI: [name] is a 57 y.o. 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 Per chart review and patient, patient lives with spouse. One level home with 3 steps to entry. Ambulated short distances in the home and community with a quad cane. Patient did have a right foot drop. Modified independent with standing transfers into and out of wheelchair. Modified independence with ADLs. Presented 11/16/2017 with increasing shortness of breath. Chest x-ray suspicious for aspiration pneumonia. Troponin 0.4. Echocardiogram showed ST depressions in the anterior lateral leads. Echocardiogram with left ventricular systolic dysfunction with ejection fraction of 40%. Diffuse hypokinesis. Grade 2 diastolic dysfunction. Cardiac catheterization showed 75% mid to distal left main stenosis with significant calcification. There was 50% proximal left circumflex stenosis. Underwent CABG 2. 11/24/2017 per Dr. Cyndia Bent. Hospital course pain management. Sternal precautions as directed. Acute blood loss anemia 8.4 and monitored. Subcutaneous Lovenox for DVT prophylaxis. Tolerating a regular diet. Physical therapy evaluation completed with recommendations of physical medicine rehabilitation consult.   Review of Systems  Constitutional: Negative for chills and fever.  HENT: Negative for hearing loss.   Eyes: Negative for blurred vision.  Respiratory: Positive for cough and shortness of breath.   Cardiovascular: Negative for chest pain and palpitations.  Gastrointestinal: Positive for constipation. Negative for nausea and vomiting.  Genitourinary: Negative for dysuria and  hematuria.  Musculoskeletal: Positive for joint pain and myalgias.  Skin: Negative for rash.  Neurological: Positive for sensory change, speech change, focal weakness and weakness. Negative for seizures.  All other systems reviewed and are negative.  Past Medical History:  Diagnosis Date  . Arthritis    "knees" (11/16/2017)  . Hay fever   . Pneumonia 11/16/2017  . Stroke [facility]) 08/23/2017   Left MCA infarct status post TPA and thrombectomy   Past Surgical History:  Procedure Laterality Date  . CORONARY ARTERY BYPASS GRAFT N/A 11/23/2017   Procedure: CORONARY ARTERY BYPASS GRAFTING (CABG) x 2, ON PUMP, USING LEFT INTERNAL MAMMARY ARTERY AND RIGHT GREATER SAPHENOUS VEIN HARVESTED ENDOSCOPICALLY;  Surgeon: Gaye Pollack, MD;  Location: [facility];  Service: Open Heart Surgery;  Laterality: N/A;  . CRYOABLATION     cervical  . IR ANGIO INTRA EXTRACRAN SEL COM CAROTID INNOMINATE UNI R MOD SED  08/21/2017  . IR ANGIO VERTEBRAL SEL SUBCLAVIAN INNOMINATE UNI R MOD SED  08/21/2017  . IR PERCUTANEOUS ART THROMBECTOMY/INFUSION INTRACRANIAL INC DIAG ANGIO  08/21/2017  . IR RADIOLOGIST EVAL & MGMT  10/30/2017  . LOOP RECORDER INSERTION N/A 08/28/2017   Procedure: LOOP RECORDER INSERTION;  Surgeon: Constance Haw, MD;  Location: [facility] CV [facility];  Service: Cardiovascular;  Laterality: N/A;  . RADIOLOGY WITH ANESTHESIA N/A 08/21/2017   Procedure: RADIOLOGY WITH ANESTHESIA;  Surgeon: Luanne Bras, MD;  Location: [facility];  Service: Radiology;  Laterality: N/A;  . RIGHT/LEFT HEART CATH AND CORONARY ANGIOGRAPHY N/A 11/20/2017   Procedure: RIGHT/LEFT HEART CATH AND CORONARY ANGIOGRAPHY;  Surgeon: Jettie Booze, MD;  Location: [place] [facility];  Service: Cardiovascular;  Laterality: N/A;  . TEE WITHOUT CARDIOVERSION N/A 08/28/2017   Procedure: TRANSESOPHAGEAL ECHOCARDIOGRAM (TEE);  Surgeon: Fay Records, MD;  Location: [facility];  Service: Cardiovascular;  Laterality: N/A;  .  TEE WITHOUT  CARDIOVERSION N/A 11/23/2017   Procedure: TRANSESOPHAGEAL ECHOCARDIOGRAM (TEE);  Surgeon: Gaye Pollack, MD;  Location: [facility];  Service: Open Heart Surgery;  Laterality: N/A;  . TUBAL LIGATION     Family History  Problem Relation Age of Onset  . Heart attack Father   . Hypertension Mother   . Hyperlipidemia Mother   . Diabetes type II Mother   . Hypertension Brother    Social History:  reports that she quit smoking about 3 months ago. Her smoking use included cigarettes. She has a 4.44 pack-year smoking history. she has never used smokeless tobacco. She reports that she does not drink alcohol or use drugs. Allergies:  Allergies  Allergen Reactions  . Lopressor [Metoprolol Tartrate] Swelling    Angioedema  . Lipitor [Atorvastatin] Diarrhea   Medications Prior to Admission  Medication Sig Dispense Refill  . aspirin 325 MG EC tablet Take 1 tablet (325 mg total) by mouth daily. 90 tablet 3  . baclofen (LIORESAL) 10 MG tablet Take 0.5 tablets (5 mg total) by mouth 3 (three) times daily. 45 tablet 1  . cetirizine (ZYRTEC) 10 MG tablet Take 10 mg by mouth daily.    [name] escitalopram (LEXAPRO) 10 MG tablet Take 1 tablet (10 mg total) by mouth daily. 90 tablet 1  . famotidine (PEPCID) 20 MG tablet Take 1 tablet (20 mg total) by mouth 2 (two) times daily. 180 tablet 3  . hydrOXYzine (ATARAX/VISTARIL) 25 MG tablet Take 1 tablet (25 mg total) by mouth at bedtime. 90 tablet 1  . rosuvastatin (CRESTOR) 20 MG tablet Take 1 tablet (20 mg total) by mouth daily. 90 tablet 3    Home: Home Living Family/patient expects to be discharged to:: Private residence Living Arrangements: Spouse/significant other Available Help at Discharge: Family, Available 24 hours/day Type of Home: House Home Access: Stairs to enter [employer] of Steps: 3 Entrance Stairs-Rails: None Home Layout: One level Bathroom Shower/Tub: Public librarian, Multimedia programmer: Standard Bathroom Accessibility:  Yes Home Equipment: Environmental consultant - 2 wheels, Crutches, Wheelchair - manual, [employer] - quad, Tub bench, Bedside commode Additional Comments: Husband and other family available 24/7  Functional History: Prior Function Level of Independence: Needs assistance Gait / Transfers Assistance Needed: Pt able to ambulate short distances at home and in community with quad cane; continues to have R foot drop. Mod indep with standing transfers into/out of wheelchair ADL's / Homemaking Assistance Needed: Mod indep with ADLs since d/c from CIR @ [facility] or cane level Comments: Has R resting hand splint  Functional Status:  Mobility: Bed Mobility Overal bed mobility: Needs Assistance Bed Mobility: Supine to Sit Supine to sit: Mod assist, +2 for safety/equipment General bed mobility comments: OOB in chair Transfers Overall transfer level: Needs assistance Equipment used: 1 person hand held assist(EVA walker) Transfers: Sit to/from Stand, Stand Pivot Transfers Sit to Stand: Mod assist General transfer comment: States she feels moe "off balance" than normal Ambulation/Gait Ambulation/Gait assistance: Mod assist Ambulation Distance (Feet): 80 Feet(x3 trials, required rest breaks inbetween trials as HR elevated to 126 bpm.  ) Assistive device: 4-wheeled walker(EVA walker) Gait Pattern/deviations: Step-through pattern, Decreased stride length, Decreased dorsiflexion - right, Decreased stance time - right, Trunk flexed, Narrow base of support, Scissoring, Ataxic General Gait Details: Pt required cues for R foot clearance and R weight shifting in R stance phase.  Pt at times begins to drag R LE when fatigue sets in.   Gait velocity interpretation: Below normal speed for age/gender  ADL: ADL Overall ADL's : Needs assistance/impaired Grooming: Minimal assistance, Sitting Upper Body Bathing: Moderate assistance, Sitting Lower Body Bathing: Minimal assistance, Sit to/from stand Upper Body Dressing : Moderate  assistance Lower Body Dressing: Moderate assistance, Sit to/from stand Toilet Transfer: Minimal assistance, Stand-pivot Toileting- Clothing Manipulation and Hygiene: Sit to/from stand, Moderate assistance Functional mobility during ADLs: Minimal assistance, +2 for physical assistance General ADL Comments: Pt requies continued cues for sternal precautions during ADL; vc to edirect attention  Cognition: Cognition Overall Cognitive Status: Impaired/Different from baseline Orientation Level: Oriented X4 Cognition Arousal/Alertness: Awake/alert Behavior During Therapy: Anxious, Impulsive Overall Cognitive Status: Impaired/Different from baseline Area of Impairment: Problem solving, Safety/judgement, Attention, Memory Current Attention Level: Sustained Memory: Decreased recall of precautions Safety/Judgement: Decreased awareness of safety, Decreased awareness of deficits Problem Solving: Slow processing, Difficulty sequencing, Requires verbal cues, Requires tactile cues General Comments: Pt with tangential conversation throughout.  Easily distracted. Forgetful of R arm.  Education throughout to maintain sternal precautions.  Blood pressure (!) 120/93, pulse 98, temperature 97.6 F (36.4 C), temperature source Oral, resp. rate (!) 26, height 5\' 2"  (1.575 m), weight 58.7 kg (129 lb 6.1 oz), SpO2 96 %. Physical Exam  Vitals reviewed. Constitutional: She is oriented to person, place, and time. She appears well-developed and well-nourished.  HENT:  Head: Normocephalic and atraumatic.  Eyes: EOM are normal. Right eye exhibits no discharge. Left eye exhibits no discharge.  Neck: Normal range of motion. Neck supple. No thyromegaly present.  Cardiovascular: Normal rate and regular rhythm.  Respiratory: Effort normal and breath sounds normal. No respiratory distress.  +[place]  GI: Soft. Bowel sounds are normal. She exhibits no distension.  Musculoskeletal:  No edema or tenderness in extremities   Neurological: She is alert and oriented to person, place, and time.  Follows commands. Motor: RUE: 2-/5 proximal to distal with apraxia RLE: 4-/5 HF, KE 3+/5 ADF  Skin:  Midline chest incision healing  Psychiatric: She has a normal mood and affect. Her behavior is normal.    Results for orders placed or performed during the hospital encounter of 11/16/17 (from the past 24 hour(s))  Glucose, capillary     Status: Abnormal   Collection Time: 11/27/17 12:14 PM  Result Value Ref Range   Glucose-Capillary 121 (H) 65 - 99 mg/dL   Comment 1 Capillary Specimen   Glucose, capillary     Status: Abnormal   Collection Time: 11/27/17 10:39 PM  Result Value Ref Range   Glucose-Capillary 117 (H) 65 - 99 mg/dL   Comment 1 Capillary Specimen    No results found.  Assessment/Plan: Diagnosis: Debility Labs independently reviewed.  Records reviewed and summated above.  1. Does the need for close, 24 hr/day medical supervision in concert with the patient's rehab needs make it unreasonable for this patient to be served in a less intensive setting? Yes  2. Co-Morbidities requiring supervision/potential complications: Acute blood loss anemia (transfuse if necessary to ensure appropriate perfusion for increased activity tolerance), Tachycardia (monitor in accordance with pain and increasing activity) 3. , post-op pain management (Biofeedback training with therapies to help reduce reliance on opiate pain medications, monitor pain control during therapies, and sedation at rest and titrate to maximum efficacy to ensure participation and gains in therapies), diastolic dysfunction (monitor for signs/symptoms of fluid overload), tobacco abuse (counsel), history of left middle cerebral artery CVA with residual deficits, hyperglycemia (Monitor in accordance with exercise and adjust meds as necessary), wean supplement oxygen as tolerated 4. Due to safety, skin/wound care, disease  management, pain management and  patient education, does the patient require 24 hr/day rehab nursing? Yes 5. Does the patient require coordinated care of a physician, rehab nurse, PT (1-2 hrs/day, 5 days/week) and OT (1-2 hrs/day, 5 days/week) to address physical and functional deficits in the context of the above medical diagnosis(es)? Yes Addressing deficits in the following areas: balance, endurance, locomotion, strength, transferring, bathing, dressing, toileting and psychosocial support 6. Can the patient actively participate in an intensive therapy program of at least 3 hrs of therapy per day at least 5 days per week? Yes 7. The potential for patient to make measurable gains while on inpatient rehab is excellent 8. Anticipated functional outcomes upon discharge from inpatient rehab are supervision and min assist  with PT, supervision and min assist with OT, n/a with SLP. 9. Estimated rehab length of stay to reach the above functional goals is: 13-17 days. 10. Anticipated D/C setting: Home 11. Anticipated post D/C treatments: HH therapy and Home excercise program 12. Overall Rehab/Functional Prognosis: good  RECOMMENDATIONS: This patient's condition is appropriate for continued rehabilitative care in the following setting: CIR Patient has agreed to participate in recommended program. Yes Note that insurance prior authorization may be required for reimbursement for recommended care.  Comment: Rehab Admissions Coordinator to follow up.  Delice Lesch, MD, ABPMR Lavon Paganini Angiulli, PA-C 11/28/2017

## 2017-11-28 NOTE — Care Management (Signed)
Pt placed with [facility] on [facility] status 11/28/17 during LOS meeting. Per unit covering CM-  Current discharge plan will be to pursue CIR however if pt discharges home - [place].

## 2017-11-28 NOTE — Progress Notes (Signed)
I met with pt, her spouse, son and pastor at bedside. They wish for me to pursue an inpt rehab admit for her rehab dispo. I will contact [facility] today and await their decision about a possible inpt rehab admit. [phone]

## 2017-11-28 NOTE — Progress Notes (Signed)
Patient had 6 beat run of v tach, notified Erin Barrett, vascular PA.  Dose of coreg adjusted.

## 2017-11-28 NOTE — Care Management Note (Addendum)
Case Management Note  Patient Details  Name: [name] MRN: [id number] Date of Birth: 02-Jun-1961  Subjective/Objective:     NSTEMI, S/P CABG               Action/Plan: Chart reviewed. Plan is for IP rehab. CIR RN Coordinator follow for IP rehab. Pt will b followed for [facility], PT by [facility] ([facility]) if she does not qualify for IP rehab. Will continue to follow for dc needs.   Expected Discharge Date:                  Expected Discharge Plan:  [place]  In-House Referral:  NA  Discharge planning Services  CM Consult  Post Acute Care Choice:  NA Choice offered to:  NA  DME Arranged:  N/A DME Agency:  NA  HH Arranged:  NA HH Agency:  NA  Status of Service:  In process, will continue to follow  If discussed at Long Length of Stay Meetings, dates discussed:  11/28/2017  Additional Comments:   Erenest Rasher, RN 11/28/2017, 2:46 PM

## 2017-11-29 ENCOUNTER — Ambulatory Visit (INDEPENDENT_AMBULATORY_CARE_PROVIDER_SITE_OTHER): Payer: BLUE CROSS/BLUE SHIELD | Admitting: *Deleted

## 2017-11-29 ENCOUNTER — Other Ambulatory Visit: Payer: Self-pay

## 2017-11-29 ENCOUNTER — Telehealth: Payer: Self-pay | Admitting: Cardiology

## 2017-11-29 ENCOUNTER — Encounter (HOSPITAL_COMMUNITY): Payer: Self-pay | Admitting: *Deleted

## 2017-11-29 ENCOUNTER — Inpatient Hospital Stay (HOSPITAL_COMMUNITY)
Admission: RE | Admit: 2017-11-29 | Discharge: 2017-12-08 | DRG: 945 | Disposition: A | Discharge status: Home / Self Care | Payer: BLUE CROSS/BLUE SHIELD | Source: Intra-hospital | Attending: Physical Medicine & Rehabilitation | Admitting: Physical Medicine & Rehabilitation

## 2017-11-29 DIAGNOSIS — R0989 Other specified symptoms and signs involving the circulatory and respiratory systems: Secondary | ICD-10-CM | POA: Diagnosis not present

## 2017-11-29 DIAGNOSIS — I952 Hypotension due to drugs: Secondary | ICD-10-CM | POA: Diagnosis not present

## 2017-11-29 DIAGNOSIS — M21371 Foot drop, right foot: Secondary | ICD-10-CM | POA: Diagnosis not present

## 2017-11-29 DIAGNOSIS — E871 Hypo-osmolality and hyponatremia: Secondary | ICD-10-CM | POA: Diagnosis not present

## 2017-11-29 DIAGNOSIS — I63512 Cerebral infarction due to unspecified occlusion or stenosis of left middle cerebral artery: Secondary | ICD-10-CM

## 2017-11-29 DIAGNOSIS — R5381 Other malaise: Secondary | ICD-10-CM | POA: Diagnosis not present

## 2017-11-29 DIAGNOSIS — D72829 Elevated white blood cell count, unspecified: Secondary | ICD-10-CM | POA: Diagnosis not present

## 2017-11-29 DIAGNOSIS — Z79899 Other long term (current) drug therapy: Secondary | ICD-10-CM | POA: Diagnosis not present

## 2017-11-29 DIAGNOSIS — E785 Hyperlipidemia, unspecified: Secondary | ICD-10-CM | POA: Diagnosis not present

## 2017-11-29 DIAGNOSIS — K5900 Constipation, unspecified: Secondary | ICD-10-CM

## 2017-11-29 DIAGNOSIS — I69351 Hemiplegia and hemiparesis following cerebral infarction affecting right dominant side: Secondary | ICD-10-CM | POA: Diagnosis not present

## 2017-11-29 DIAGNOSIS — D62 Acute posthemorrhagic anemia: Secondary | ICD-10-CM | POA: Diagnosis present

## 2017-11-29 DIAGNOSIS — Z87891 Personal history of nicotine dependence: Secondary | ICD-10-CM | POA: Diagnosis not present

## 2017-11-29 DIAGNOSIS — Z7982 Long term (current) use of aspirin: Secondary | ICD-10-CM

## 2017-11-29 DIAGNOSIS — M170 Bilateral primary osteoarthritis of knee: Secondary | ICD-10-CM | POA: Diagnosis not present

## 2017-11-29 DIAGNOSIS — Z888 Allergy status to other drugs, medicaments and biological substances status: Secondary | ICD-10-CM | POA: Diagnosis not present

## 2017-11-29 DIAGNOSIS — Z8249 Family history of ischemic heart disease and other diseases of the circulatory system: Secondary | ICD-10-CM

## 2017-11-29 DIAGNOSIS — R739 Hyperglycemia, unspecified: Secondary | ICD-10-CM | POA: Diagnosis present

## 2017-11-29 DIAGNOSIS — Z48812 Encounter for surgical aftercare following surgery on the circulatory system: Secondary | ICD-10-CM | POA: Diagnosis not present

## 2017-11-29 DIAGNOSIS — L509 Urticaria, unspecified: Secondary | ICD-10-CM

## 2017-11-29 DIAGNOSIS — I10 Essential (primary) hypertension: Secondary | ICD-10-CM

## 2017-11-29 DIAGNOSIS — R0602 Shortness of breath: Secondary | ICD-10-CM | POA: Diagnosis not present

## 2017-11-29 DIAGNOSIS — M62838 Other muscle spasm: Secondary | ICD-10-CM

## 2017-11-29 DIAGNOSIS — Z951 Presence of aortocoronary bypass graft: Secondary | ICD-10-CM | POA: Diagnosis not present

## 2017-11-29 DIAGNOSIS — F4329 Adjustment disorder with other symptoms: Secondary | ICD-10-CM

## 2017-11-29 DIAGNOSIS — F411 Generalized anxiety disorder: Secondary | ICD-10-CM

## 2017-11-29 MED ORDER — FUROSEMIDE 40 MG PO TABS: 40.0000 mg | ORAL_TABLET | Freq: Every day | ORAL | 0 refills | Status: DC

## 2017-11-29 MED ORDER — ROSUVASTATIN CALCIUM 20 MG PO TABS
20.0000 mg | ORAL_TABLET | Freq: Every day | ORAL | Status: DC
  Administered 2017-11-30 – 2017-12-08 (×9): 20 mg via ORAL
  Filled 2017-11-29 (×9): qty 1

## 2017-11-29 MED ORDER — FUROSEMIDE 40 MG PO TABS
40.0000 mg | ORAL_TABLET | Freq: Every day | ORAL | Status: DC
  Administered 2017-11-30 – 2017-12-05 (×6): 40 mg via ORAL
  Filled 2017-11-29 (×7): qty 1

## 2017-11-29 MED ORDER — STARCH (THICKENING) PO POWD: 1.0000 g | ORAL | 0 refills | Status: DC | PRN

## 2017-11-29 MED ORDER — HYDROCORTISONE 1 % EX CREA: TOPICAL_CREAM | Freq: Three times a day (TID) | CUTANEOUS | 0 refills | Status: DC | PRN

## 2017-11-29 MED ORDER — CARVEDILOL 6.25 MG PO TABS: 6.2500 mg | ORAL_TABLET | Freq: Two times a day (BID) | ORAL | 1 refills | Status: DC

## 2017-11-29 MED ORDER — BISACODYL 5 MG PO TBEC: 10.0000 mg | DELAYED_RELEASE_TABLET | Freq: Every day | ORAL | 0 refills | Status: DC | PRN

## 2017-11-29 MED ORDER — ONDANSETRON HCL 4 MG PO TABS: 4.0000 mg | ORAL_TABLET | Freq: Four times a day (QID) | ORAL | Status: DC | PRN

## 2017-11-29 MED ORDER — LEVALBUTEROL HCL 0.63 MG/3ML IN NEBU: 0.6300 mg | INHALATION_SOLUTION | Freq: Three times a day (TID) | RESPIRATORY_TRACT | 12 refills | Status: DC | PRN

## 2017-11-29 MED ORDER — DOCUSATE SODIUM 100 MG PO CAPS
200.0000 mg | ORAL_CAPSULE | Freq: Every day | ORAL | Status: DC
  Administered 2017-11-30 – 2017-12-07 (×8): 200 mg via ORAL
  Filled 2017-11-29 (×8): qty 2

## 2017-11-29 MED ORDER — ONDANSETRON HCL 4 MG/2ML IJ SOLN: 4.0000 mg | Freq: Four times a day (QID) | INTRAMUSCULAR | Status: DC | PRN

## 2017-11-29 MED ORDER — BISACODYL 10 MG RE SUPP: 10.0000 mg | Freq: Every day | RECTAL | Status: DC | PRN

## 2017-11-29 MED ORDER — SORBITOL 70 % SOLN
30.0000 mL | Freq: Every day | Status: DC | PRN
  Filled 2017-11-29: qty 30

## 2017-11-29 MED ORDER — LEVALBUTEROL HCL 0.63 MG/3ML IN NEBU: 0.6300 mg | INHALATION_SOLUTION | Freq: Three times a day (TID) | RESPIRATORY_TRACT | Status: DC | PRN

## 2017-11-29 MED ORDER — GUAIFENESIN ER 600 MG PO TB12: 1200.0000 mg | ORAL_TABLET | Freq: Two times a day (BID) | ORAL | 0 refills | Status: DC

## 2017-11-29 MED ORDER — HYDROCORTISONE 1 % EX CREA
TOPICAL_CREAM | Freq: Three times a day (TID) | CUTANEOUS | Status: DC | PRN
  Filled 2017-11-29: qty 28

## 2017-11-29 MED ORDER — ENOXAPARIN SODIUM 40 MG/0.4ML ~~LOC~~ SOLN
40.0000 mg | Freq: Every day | SUBCUTANEOUS | Status: DC
  Administered 2017-11-29 – 2017-12-07 (×9): 40 mg via SUBCUTANEOUS
  Filled 2017-11-29 (×9): qty 0.4

## 2017-11-29 MED ORDER — BACLOFEN 5 MG HALF TABLET
5.0000 mg | ORAL_TABLET | Freq: Three times a day (TID) | ORAL | Status: DC
  Administered 2017-11-29 – 2017-12-08 (×27): 5 mg via ORAL
  Filled 2017-11-29 (×27): qty 1

## 2017-11-29 MED ORDER — POTASSIUM CHLORIDE ER 20 MEQ PO TBCR: 20.0000 meq | EXTENDED_RELEASE_TABLET | Freq: Two times a day (BID) | ORAL | 0 refills | Status: DC

## 2017-11-29 MED ORDER — BISACODYL 5 MG PO TBEC: 10.0000 mg | DELAYED_RELEASE_TABLET | Freq: Every day | ORAL | Status: DC | PRN

## 2017-11-29 MED ORDER — OXYCODONE HCL 5 MG PO TABS
5.0000 mg | ORAL_TABLET | ORAL | Status: DC | PRN
  Administered 2017-11-29 – 2017-11-30 (×4): 10 mg via ORAL
  Administered 2017-12-01 – 2017-12-05 (×4): 5 mg via ORAL
  Filled 2017-11-29 (×2): qty 1
  Filled 2017-11-29 (×4): qty 2
  Filled 2017-11-29 (×2): qty 1
  Filled 2017-11-29: qty 2

## 2017-11-29 MED ORDER — GUAIFENESIN ER 600 MG PO TB12
1200.0000 mg | ORAL_TABLET | Freq: Two times a day (BID) | ORAL | Status: DC
  Administered 2017-11-29 – 2017-12-08 (×18): 1200 mg via ORAL
  Filled 2017-11-29 (×20): qty 2

## 2017-11-29 MED ORDER — DOCUSATE SODIUM 100 MG PO CAPS: 200.0000 mg | ORAL_CAPSULE | Freq: Every day | ORAL | 0 refills | Status: DC

## 2017-11-29 MED ORDER — METOLAZONE 5 MG PO TABS
5.0000 mg | ORAL_TABLET | Freq: Once | ORAL | Status: AC
  Administered 2017-11-29: 5 mg via ORAL
  Filled 2017-11-29: qty 1

## 2017-11-29 MED ORDER — RESOURCE THICKENUP CLEAR PO POWD
ORAL | Status: DC | PRN
  Filled 2017-11-29: qty 125

## 2017-11-29 MED ORDER — OXYCODONE HCL 5 MG PO TABS: 5.0000 mg | ORAL_TABLET | ORAL | 0 refills | Status: DC | PRN

## 2017-11-29 MED ORDER — POTASSIUM CHLORIDE ER 10 MEQ PO TBCR
20.0000 meq | EXTENDED_RELEASE_TABLET | Freq: Two times a day (BID) | ORAL | Status: DC
  Administered 2017-11-29 – 2017-12-08 (×18): 20 meq via ORAL
  Filled 2017-11-29 (×34): qty 2

## 2017-11-29 MED ORDER — HYDROXYZINE HCL 25 MG PO TABS
25.0000 mg | ORAL_TABLET | Freq: Every day | ORAL | Status: DC
  Administered 2017-11-29 – 2017-12-07 (×9): 25 mg via ORAL
  Filled 2017-11-29 (×9): qty 1

## 2017-11-29 MED ORDER — CARVEDILOL 6.25 MG PO TABS
6.2500 mg | ORAL_TABLET | Freq: Two times a day (BID) | ORAL | Status: DC
  Administered 2017-11-29 – 2017-12-08 (×16): 6.25 mg via ORAL
  Filled 2017-11-29 (×18): qty 1

## 2017-11-29 MED ORDER — TRAMADOL HCL 50 MG PO TABS
50.0000 mg | ORAL_TABLET | Freq: Four times a day (QID) | ORAL | Status: DC | PRN
  Administered 2017-11-30 – 2017-12-08 (×21): 50 mg via ORAL
  Filled 2017-11-29 (×22): qty 1

## 2017-11-29 MED ORDER — ASPIRIN EC 325 MG PO TBEC
325.0000 mg | DELAYED_RELEASE_TABLET | Freq: Every day | ORAL | Status: DC
  Administered 2017-11-30 – 2017-12-08 (×9): 325 mg via ORAL
  Filled 2017-11-29 (×9): qty 1

## 2017-11-29 MED ORDER — ESCITALOPRAM OXALATE 10 MG PO TABS
10.0000 mg | ORAL_TABLET | Freq: Every day | ORAL | Status: DC
  Administered 2017-11-30 – 2017-12-08 (×9): 10 mg via ORAL
  Filled 2017-11-29 (×9): qty 1

## 2017-11-29 MED ORDER — PANTOPRAZOLE SODIUM 40 MG PO TBEC
40.0000 mg | DELAYED_RELEASE_TABLET | Freq: Every day | ORAL | Status: DC
  Administered 2017-11-30 – 2017-12-08 (×9): 40 mg via ORAL
  Filled 2017-11-29 (×9): qty 1

## 2017-11-29 NOTE — Progress Notes (Addendum)
I have insurance approval to admit pt to inpt rehab today. I met with pt at bedside and then contacted her spouse by phone. He is in agreement to admit. RN, Lattie Haw, to contact [place], [place] for d/c. RN CM and SW are aware. I will make the arrangements to admit today. [phone]

## 2017-11-29 NOTE — H&P (Signed)
Physical Medicine and Rehabilitation Admission H&P    Chief Complaint  Patient presents with  . Shortness of Breath  : HPI: [name] is a 57 y.o. 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 Per chart review and patient, patient lives with spouse. One level home with 3 steps to entry. Ambulated short distances in the home and community with a quad cane. Patient did have a right foot drop. Modified independent with standing transfers into and out of wheelchair. Modified independence with ADLs. Presented 11/16/2017 with increasing shortness of breath. Chest x-ray suspicious for aspiration pneumonia. Troponin 0.4. Echocardiogram showed ST depressions in the anterior lateral leads. Echocardiogram with left ventricular systolic dysfunction with ejection fraction of 40%. Diffuse hypokinesis. Grade 2 diastolic dysfunction. Cardiac catheterization showed 75% mid to distal left main stenosis with significant calcification. There was 50% proximal left circumflex stenosis. Underwent CABG 2. 11/24/2017 per Dr. Cyndia Bent. Hospital course pain management. Sternal precautions as directed. Acute blood loss anemia monitored. Subcutaneous Lovenox for DVT prophylaxis. Tolerating a regular diet. Physical and occupational therapy evaluations completed with recommendations of physical medicine rehabilitation consult.Patient was admitted for a comprehensive rehabilitation program  Review of Systems  Respiratory: Positive for cough and shortness of breath.   Gastrointestinal: Positive for constipation. Negative for nausea and vomiting.  Genitourinary: Negative for flank pain and hematuria.  Musculoskeletal: Positive for joint pain and myalgias.  Neurological: Positive for sensory change, speech change  and focal weakness.  All other systems reviewed and are negative.  Past Medical History:  Diagnosis Date  . Arthritis    "knees" (11/16/2017)  . Hay fever   . Pneumonia 11/16/2017  . Stroke [facility]) 08/23/2017   Left MCA infarct status post TPA and thrombectomy   Past Surgical History:  Procedure Laterality Date  . CORONARY ARTERY BYPASS GRAFT N/A 11/23/2017   Procedure: CORONARY ARTERY BYPASS GRAFTING (CABG) x 2, ON PUMP, USING LEFT INTERNAL MAMMARY ARTERY AND RIGHT GREATER SAPHENOUS VEIN HARVESTED ENDOSCOPICALLY;  Surgeon: Gaye Pollack, MD;  Location: [place];  Service: Open Heart Surgery;  Laterality: N/A;  . CRYOABLATION     cervical  . IR ANGIO INTRA EXTRACRAN SEL COM CAROTID INNOMINATE UNI R MOD SED  08/21/2017  . IR ANGIO VERTEBRAL SEL SUBCLAVIAN INNOMINATE UNI R MOD SED  08/21/2017  . IR PERCUTANEOUS ART THROMBECTOMY/INFUSION INTRACRANIAL INC DIAG ANGIO  08/21/2017  . IR RADIOLOGIST EVAL & MGMT  10/30/2017  . LOOP RECORDER INSERTION N/A 08/28/2017   Procedure: LOOP RECORDER INSERTION;  Surgeon: Constance Haw, MD;  Location: [place] [facility];  Service: Cardiovascular;  Laterality: N/A;  . RADIOLOGY WITH ANESTHESIA N/A 08/21/2017   Procedure: RADIOLOGY WITH ANESTHESIA;  Surgeon: Luanne Bras, MD;  Location: [place];  Service: Radiology;  Laterality: N/A;  . RIGHT/LEFT HEART CATH AND CORONARY ANGIOGRAPHY N/A 11/20/2017   Procedure: RIGHT/LEFT HEART CATH AND CORONARY ANGIOGRAPHY;  Surgeon: Jettie Booze, MD;  Location: [place] [facility];  Service: Cardiovascular;  Laterality: N/A;  . TEE WITHOUT CARDIOVERSION N/A 08/28/2017   Procedure: TRANSESOPHAGEAL ECHOCARDIOGRAM (TEE);  Surgeon: Fay Records, MD;  Location: [place];  Service: Cardiovascular;  Laterality: N/A;  . TEE WITHOUT CARDIOVERSION N/A 11/23/2017   Procedure: TRANSESOPHAGEAL ECHOCARDIOGRAM (TEE);  Surgeon: Gaye Pollack, MD;  Location: [facility];  Service: Open Heart Surgery;  Laterality: N/A;  . TUBAL  LIGATION     Family History  Problem Relation Age of Onset  . Heart attack Father   . Hypertension Mother   . Hyperlipidemia Mother   . Diabetes type II Mother   . Hypertension Brother    Social History:  reports that she quit smoking about 3 months ago. Her smoking use included cigarettes. She has a 4.44 pack-year smoking history. she has never used smokeless tobacco. She reports that she does not drink alcohol or use drugs. Allergies:  Allergies  Allergen Reactions  . Lopressor [Metoprolol Tartrate] Swelling    Angioedema  . Lipitor [Atorvastatin] Diarrhea   Medications Prior to Admission  Medication Sig Dispense Refill  . aspirin 325 MG EC tablet Take 1 tablet (325 mg total) by mouth daily. 90 tablet 3  . baclofen (LIORESAL) 10 MG tablet Take 0.5 tablets (5 mg total) by mouth 3 (three) times daily. 45 tablet 1  . cetirizine (ZYRTEC) 10 MG tablet Take 10 mg by mouth daily.    [name] escitalopram (LEXAPRO) 10 MG tablet Take 1 tablet (10 mg total) by mouth daily. 90 tablet 1  . famotidine (PEPCID) 20 MG tablet Take 1 tablet (20 mg total) by mouth 2 (two) times daily. 180 tablet 3  . hydrOXYzine (ATARAX/VISTARIL) 25 MG tablet Take 1 tablet (25 mg total) by mouth at bedtime. 90 tablet 1  . rosuvastatin (CRESTOR) 20 MG tablet Take 1 tablet (20 mg total) by mouth daily. 90 tablet 3    Drug Regimen Review Drug regimen was reviewed and remains appropriate with no significant issues identified  Home: Home Living Family/patient expects to be discharged to:: Private residence Living Arrangements: Spouse/significant other Available Help at Discharge: Family, Available 24 hours/day Type of Home: House Home Access: Stairs to enter [employer] of Steps: 3 Entrance Stairs-Rails: None Home Layout: One level Bathroom Shower/Tub: Public librarian, Multimedia programmer: Standard Bathroom Accessibility: Yes Home Equipment: Environmental consultant - 2 wheels, Crutches, Wheelchair - manual,  [employer] - quad, Tub bench, Bedside commode Additional Comments: Husband and other family available 24/7   Functional History: Prior Function Level of Independence: Needs assistance Gait / Transfers Assistance Needed: Pt able to ambulate short distances at home and in community with quad cane; continues to have R foot drop. Mod indep with standing transfers into/out of wheelchair ADL's / Homemaking Assistance Needed: Mod indep with ADLs since d/c from CIR @ wc or cane level Comments: Has R resting hand splint   Functional Status:  Mobility: Bed Mobility Overal bed mobility: Needs Assistance Bed Mobility: Supine to Sit Supine to sit: Mod assist, +2 for safety/equipment General bed mobility comments: OOB in chair Transfers Overall transfer level: Needs assistance Equipment used: 1 person hand held assist(EVA walker) Transfers: Sit to/from Stand, Stand Pivot Transfers Sit to Stand: Mod assist General transfer comment: States she feels moe "off balance" than normal Ambulation/Gait Ambulation/Gait assistance: Mod assist Ambulation Distance (Feet): 80 Feet(x3 trials, required rest breaks inbetween trials as HR elevated to 126 bpm.  ) Assistive device: 4-wheeled walker(EVA walker) Gait Pattern/deviations: Step-through pattern, Decreased stride length, Decreased dorsiflexion - right, Decreased stance time - right, Trunk flexed, Narrow base of support, Scissoring, Ataxic General Gait Details: Pt required cues for R foot clearance and R weight shifting in R stance phase.  Pt at times begins to drag R LE when fatigue sets in.   Gait velocity interpretation: Below normal speed for age/gender    ADL: ADL Overall ADL's : Needs  assistance/impaired Grooming: Minimal assistance, Sitting Upper Body Bathing: Moderate assistance, Sitting Lower Body Bathing: Minimal assistance, Sit to/from stand Upper Body Dressing : Moderate assistance Lower Body Dressing: Moderate assistance, Sit to/from  stand Toilet Transfer: Minimal assistance, Stand-pivot Toileting- Clothing Manipulation and Hygiene: Sit to/from stand, Moderate assistance Functional mobility during ADLs: Minimal assistance, +2 for physical assistance General ADL Comments: Pt requies continued cues for sternal precautions during ADL; vc to edirect attention  Cognition: Cognition Overall Cognitive Status: Impaired/Different from baseline Orientation Level: Oriented X4 Cognition Arousal/Alertness: Awake/alert Behavior During Therapy: Anxious, Impulsive Overall Cognitive Status: Impaired/Different from baseline Area of Impairment: Problem solving, Safety/judgement, Attention, Memory Current Attention Level: Sustained Memory: Decreased recall of precautions Safety/Judgement: Decreased awareness of safety, Decreased awareness of deficits Problem Solving: Slow processing, Difficulty sequencing, Requires verbal cues, Requires tactile cues General Comments: Pt with tangential conversation throughout.  Easily distracted. Forgetful of R arm.  Education throughout to maintain sternal precautions.  Physical Exam: Blood pressure (!) 120/93, pulse 98, temperature 97.6 F (36.4 C), temperature source Oral, resp. rate (!) 28, height 5' 2" (1.575 m), weight 58.7 kg (129 lb 6.1 oz), SpO2 96 %. Physical Exam  Vitals reviewed. Constitutional: She is oriented to person, place, and time. She appears well-developed and well-nourished.  HENT:  Head: Normocephalic and atraumatic.  Eyes: EOM are normal. Right eye exhibits no discharge. Left eye exhibits no discharge.  Neck: Normal range of motion. Neck supple.  Cardiovascular: Normal rate and regular rhythm.  Respiratory: Effort normal and breath sounds normal.  GI: Soft. Bowel sounds are normal.  Musculoskeletal:  No edema or tenderness in extremities  Neurological: She is alert and oriented to person, place, and time.  Follows commands. Motor: RUE: 2-/5 proximal to distal with  apraxia RLE: 4-/5 HF, KE 3+/5 ADF   Skin: Skin is warm and dry.  Psychiatric:  Tangential at times    Results for orders placed or performed during the hospital encounter of 11/16/17 (from the past 48 hour(s))  Glucose, capillary     Status: Abnormal   Collection Time: 11/26/17  4:04 PM  Result Value Ref Range   Glucose-Capillary 115 (H) 65 - 99 mg/dL   Comment 1 Capillary Specimen   Glucose, capillary     Status: Abnormal   Collection Time: 11/26/17  8:58 PM  Result Value Ref Range   Glucose-Capillary 192 (H) 65 - 99 mg/dL  Basic metabolic panel     Status: Abnormal   Collection Time: 11/27/17  3:26 AM  Result Value Ref Range   Sodium 137 135 - 145 mmol/L   Potassium 3.7 3.5 - 5.1 mmol/L   Chloride 99 (L) 101 - 111 mmol/L   CO2 29 22 - 32 mmol/L   Glucose, Bld 92 65 - 99 mg/dL   BUN 9 6 - 20 mg/dL   Creatinine, Ser 0.68 0.44 - 1.00 mg/dL   Calcium 8.1 (L) 8.9 - 10.3 mg/dL   GFR calc non Af Amer >60 >60 mL/min   GFR calc Af Amer >60 >60 mL/min    Comment: (NOTE) The eGFR has been calculated using the CKD EPI equation. This calculation has not been validated in all clinical situations. eGFR's persistently <60 mL/min signify possible Chronic Kidney Disease.    Anion gap 9 5 - 15    Comment: Performed at [facility], [address] [place], [place] [address]  CBC     Status: Abnormal   Collection Time: 11/27/17  3:26 AM  Result Value Ref   Range   WBC 7.8 4.0 - 10.5 K/uL   RBC 2.79 (L) 3.87 - 5.11 MIL/uL   Hemoglobin 8.4 (L) 12.0 - 15.0 g/dL   HCT 26.3 (L) 36.0 - 46.0 %   MCV 94.3 78.0 - 100.0 fL   MCH 30.1 26.0 - 34.0 pg   MCHC 31.9 30.0 - 36.0 g/dL   RDW 14.6 11.5 - 15.5 %   Platelets 251 150 - 400 K/uL    Comment: Performed at [facility] [address] [place], [place] [address]  .Cooxemetry Panel (carboxy, met, total hgb, O2 sat)     Status: Abnormal   Collection Time: 11/27/17  3:40 AM  Result Value Ref Range   Total hemoglobin 8.1 (L) 12.0 -  16.0 g/dL   O2 Saturation 70.0 %   Carboxyhemoglobin 1.6 (H) 0.5 - 1.5 %   Methemoglobin 0.7 0.0 - 1.5 %  Glucose, capillary     Status: None   Collection Time: 11/27/17  7:30 AM  Result Value Ref Range   Glucose-Capillary 85 65 - 99 mg/dL   Comment 1 Capillary Specimen    Comment 2 Notify RN   Glucose, capillary     Status: Abnormal   Collection Time: 11/27/17 12:14 PM  Result Value Ref Range   Glucose-Capillary 121 (H) 65 - 99 mg/dL   Comment 1 Capillary Specimen   Glucose, capillary     Status: Abnormal   Collection Time: 11/27/17 10:39 PM  Result Value Ref Range   Glucose-Capillary 117 (H) 65 - 99 mg/dL   Comment 1 Capillary Specimen    No results found.  Medical Problem List and Plan: 1.  Debility secondary to CAD S/P CABG x2 11/24/2017.Sternal precautions as well as history of left middle cerebral artery CVA November 2018 with residual right hemiparesis 2.  DVT Prophylaxis/Anticoagulation: Subcutaneous Lovenox 3. Pain Management: Baclofen 5 mg TID,Oxycodone/Ultram as needed 4. Mood:Lexapro 10 mg daily  5. Neuropsych: This patient is capable of making decisions on her own behalf. 6. Skin/Wound Care: Routine skin checks 7. Fluids/Electrolytes/Nutrition: Routine I&O with follow up chemestries 8.Hypertension.Coreg 3.125 mg BID,Lasix 40 mg daily 9. History of tobacco abuse. Provide counseling 10. Hyperlipidemia. Crestor 11. Constipation. Laxative assistance   Post Admission Physician Evaluation: 1. Preadmission assessment reviewed and changes made below. 2. Functional deficits secondary  to debility. 3. Patient is admitted to receive collaborative, interdisciplinary care between the physiatrist, rehab nursing staff, and therapy team. 4. Patient's level of medical complexity and substantial therapy needs in context of that medical necessity cannot be provided at a lesser intensity of care such as a SNF. 5. Patient has experienced substantial functional loss from his/her  baseline which was documented above under the "Functional History" and "Functional Status" headings.  Judging by the patient's diagnosis, physical exam, and functional history, the patient has potential for functional progress which will result in measurable gains while on inpatient rehab.  These gains will be of substantial and practical use upon discharge  in facilitating mobility and self-care at the household level. 53. Physiatrist will provide 24 hour management of medical needs as well as oversight of the therapy plan/treatment and provide guidance as appropriate regarding the interaction of the two. 7. 24 hour rehab nursing will assist with safety, disease management and patient education  and help integrate therapy concepts, techniques,education, etc. 8. PT will assess and treat for/with: Lower extremity strength, range of motion, stamina, balance, functional mobility, safety, adaptive techniques and equipment, coping skills, pain control, education.  Goals are: Supervision. 9. OT will assess and treat for/with: ADL's, functional mobility, safety, upper extremity strength, adaptive techniques and equipment, ego support, and community reintegration.   Goals are: Supervision/Min A. Therapy may not proceed with showering this patient. 10. Case Management and Social Worker will assess and treat for psychological issues and discharge planning. 11. Team conference will be held weekly to assess progress toward goals and to determine barriers to discharge. 12. Patient will receive at least 3 hours of therapy per day at least 5 days per week. 13. ELOS: 11-16 days. 14. Prognosis:  good  Ankit Patel, MD, ABPMR Daniel J Angiulli, PA-C 11/28/2017  

## 2017-11-29 NOTE — Discharge Summary (Addendum)
[address]       [place] [address]             [phone]      Physician Discharge Summary  Patient ID: [name] MRN: [id number] DOB/AGE: 10-18-1960 57 y.o.  Admit date: 11/16/2017 Discharge date: 11/29/2017  Admission Diagnoses: Patient Active Problem List   Diagnosis Date Noted  . Community acquired pneumonia of right lower lobe of lung ([place])   . Acute blood loss anemia   . Tachycardia   . Post-operative pain   . Diastolic dysfunction   . Tobacco abuse   . History of CVA with residual deficit   . Hyperglycemia   . Supplemental oxygen dependent   . Coronary artery disease 11/23/2017  . Acute on chronic systolic heart failure ([place]) 11/21/2017  . Acute respiratory failure with hypoxia ([place]) 11/16/2017  . Aspiration pneumonia ([place]) 11/16/2017  . Elevated troponin 11/16/2017  . NSTEMI (non-ST elevated myocardial infarction) ([place]) 11/16/2017  . Constipation 11/10/2017  . Urticaria 10/13/2017  . Hypokalemia   . Cerebrovascular accident (CVA) due to occlusion of left middle cerebral artery ([place])   . Right hemiplegia ([place])   . Dysphagia, post-stroke   . Hyperlipidemia     Discharge Diagnoses:  Active Problems:   Right hemiplegia ([place])   Hypokalemia   Former smoker   Acute respiratory failure with hypoxia ([facility])   Aspiration pneumonia ([facility])   Elevated troponin   NSTEMI (non-ST elevated myocardial infarction) ([facility])   Acute on chronic systolic heart failure ([facility])   S/P CABG x 2   Coronary artery disease   Community acquired pneumonia of right lower lobe of lung ([place])   Hx of CABG   Acute blood loss anemia   Tachycardia   Post-operative pain   Diastolic dysfunction   Tobacco abuse   History of CVA with residual deficit   Hyperglycemia   Supplemental oxygen dependent   Discharged Condition: good  HPI:   The patient is a 57 year old woman with a family history of premature coronary artery disease and smoking who suffered a left  MCA stroke in 07/2017 with right hemiparesis.  She was treated with TPA and thrombectomy and went to inpatient rehab until September 14, 2017.  She made some improvement with return of some function in her right lower extremity but very little function in the right upper extremity.  She went back home with her husband after inpatient rehab.  She had a loop recorder placed and has not had any documented atrial fibrillation.  She was admitted on 11/16/2017 with new onset shortness of breath which she says was associated with some pains across her lower abdomen.  She came to the emergency room and was noted to be hypoxemic and was started on oxygen therapy but required BiPAP.  She was diagnosed with bilateral lower lobe pneumonia suspicious for aspiration.  Her troponin was mildly elevated initially at 0.4.  Echocardiogram showed some ST depressions in the anterolateral leads.  Her troponin rose to a peak of 10.5.  She had an echocardiogram showing moderate left ventricular systolic dysfunction with ejection fraction of 35-40%.  There is diffuse hypokinesis with akinesis in the basal-mid inferior lateral, inferior, and inferoseptal myocardium.  There is grade 2 diastolic dysfunction.  There was mild to moderate mitral regurgitation.  Cardiac catheterization yesterday showed 75% mid to distal left main stenosis with significant calcification.  There was 50% ostial to proximal left circumflex stenosis.  There is 40%  ostial LAD to proximal LAD stenosis.  The mid left circumflex was occluded after the first marginal branch.  There was faint collaterals to the distal vessel which appeared fairly small.  The right coronary artery was a small nondominant vessel with 90% proximal stenosis.  Left ventricular ejection fraction was estimated visually to be 25-35%.  Right heart catheterization showed a cardiac index of 2.29 with a PA sat of 62%.  Right heart pressures were normal.  LVEDP was normal.   Hospital Course:   Ms.  Rudnick is a 57 year old female patient who underwent a CABG x 2 and right axillary cannulation for cardiopulmonary bypass with Dr. Cyndia Bent on 11/24/2017. She tolerated the procedure well and was transferred to the ICU for continued care. She was extubated in a timely manner.  POD 1 she remained hemodynamically stable on inotropic support.  We did not start a beta-blocker due to an allergy.  She did have some expected acute blood loss anemia and we transfused a unit of packed red blood cells.  We initiated a diuretic regimen for fluid overload.  We discontinued her Deberah Pelton catheter.  We consulted physical therapy due to significant chronic disability from her previous stroke back in November.  We continue to wean her milrinone and followed her co-ox closely.  We began to advance her diet.  We encourage incentive spirometry for expected postoperative atelectasis.  Postop day 3 she continued to progress.  We discontinued her milrinone.  She remained hemodynamically stable and a normal sinus rhythm on a low-dose Coreg.  We continued her on Lovenox and SCDs for DVT prophylaxis.  Postop day 4 she was diuresing well but still remained fluid overloaded.  We continued Lasix and supplemented her potassium.  She was ambulating okay with a walker around the unit.  She was stable to transfer to 4 E. for continued care.  Postop day 5 she remained in normal sinus rhythm.  She still was utilizing 2 L for oxygen support.  Her creatinine remained stable on her diuretic regimen.  Physical therapy and occupational therapy continue to work with the patient aggressively.  We placed a consult for inpatient rehab which was recommended by physical therapy.  We discontinued her epicardial pacing wires since her rhythm have been stable.  Postop day 6 she remained in normal sinus rhythm.  She remained on 2 L nasal cannula for oxygen support.  She continued to become more mobile and independent.  She did have some continued incisional  discomfort.  She continues to ambulate 3 times a day with her assist device.  Today, she is tolerating room air, her incisions are healing well, and she is ambulating with her assist device and she is deemed ready for discharge.   Consults: rehabilitation medicine  Significant Diagnostic Studies:    CLINICAL DATA:  Postop CABG surgery.  EXAM: PORTABLE CHEST 1 VIEW  COMPARISON:  11/25/2017  FINDINGS: There has been a mild increase in lung base opacity on the right now mostly silhouetting the right hemidiaphragm, consistent with a combination of pleural fluid and atelectasis. Left lung base and mid lung zone opacity is stable also consistent with pleural fluid and atelectasis.  There are irregularly thickened interstitial markings similar to the previous day's study.  Cardiac silhouette is mildly enlarged. There is no mediastinal widening.  No pneumothorax.  Right internal jugular introducer sheath is stable.  IMPRESSION: 1. Mild increase in right lung base opacity consistent with an increase in atelectasis and pleural fluid. 2. No  other change. Persistent left lung base atelectasis and pleural fluid. No overt pulmonary edema. No pneumothorax.   Electronically Signed   By: Lajean Manes M.D.   On: 11/26/2017 06:59  Treatments:    CARDIOVASCULAR SURGERY OPERATIVE NOTE  11/23/2017  Surgeon:  Gaye Pollack, MD  First Assistant: Lars Pinks, [facility]   Preoperative Diagnosis:  High grade left main and severe multi-vessel coronary artery disease   Postoperative Diagnosis:  Same   Procedure:  1. Median Sternotomy 2. Extracorporeal circulation 3. Right axillary artery cannulation via 8 mm Hemashield graft for cardiopulmonary bypass. 4.   Coronary artery bypass grafting x 2   Left internal mammary graft to the LAD  SVG to OM   4.   Endoscopic vein harvest from the right leg   Anesthesia:  General  Endotracheal      Discharge Exam: Blood pressure 130/75, pulse 92, temperature 98 F (36.7 C), temperature source Oral, resp. rate 13, height 5\' 2"  (1.575 m), weight 133 lb 6.1 oz (60.5 kg), SpO2 97 %.   General appearance: alert, cooperative and no distress Heart: RRR, soft systolic murmur Lungs: clear to auscultation bilaterally Abdomen: soft, non-tender; bowel sounds normal; no masses,  no organomegaly Extremities: extremities normal, atraumatic, no cyanosis or edema Wound: clean and dry    Disposition: 06-Home-Health Care Svc  Discharge Instructions    Amb Referral to Cardiac Rehabilitation   Complete by:  As directed    Diagnosis:   CABG NSTEMI     CABG X ___:  2     Allergies as of 11/29/2017      Reactions   Lopressor [metoprolol Tartrate] Swelling   Angioedema   Lipitor [atorvastatin] Diarrhea      Medication List    TAKE these medications   aspirin 325 MG EC tablet Take 1 tablet (325 mg total) by mouth daily.   baclofen 10 MG tablet Commonly known as:  LIORESAL Take 0.5 tablets (5 mg total) by mouth 3 (three) times daily.   bisacodyl 5 MG EC tablet Commonly known as:  DULCOLAX Take 2 tablets (10 mg total) by mouth daily as needed for moderate constipation (Give daily if no BM).   carvedilol 6.25 MG tablet Commonly known as:  COREG Take 1 tablet (6.25 mg total) by mouth 2 (two) times daily with a meal.   cetirizine 10 MG tablet Commonly known as:  ZYRTEC Take 10 mg by mouth daily.   docusate sodium 100 MG capsule Commonly known as:  COLACE Take 2 capsules (200 mg total) by mouth daily.   escitalopram 10 MG tablet Commonly known as:  LEXAPRO Take 1 tablet (10 mg total) by mouth daily.   famotidine 20 MG tablet Commonly known as:  PEPCID Take 1 tablet (20 mg total) by mouth 2 (two) times daily.   food thickener Powd Commonly known as:  THICK IT Take 1 g by mouth as needed ([place] per order). Following instructions on the can for  thickening.   furosemide 40 MG tablet Commonly known as:  LASIX Take 1 tablet (40 mg total) by mouth daily. Start taking on:  11/30/2017   guaiFENesin 600 MG 12 hr tablet Commonly known as:  MUCINEX Take 2 tablets (1,200 mg total) by mouth 2 (two) times daily.   hydrocortisone cream 1 % Apply topically 3 (three) times daily as needed for itching.   hydrOXYzine 25 MG tablet Commonly known as:  ATARAX/VISTARIL Take 1 tablet (25 mg total) by mouth at bedtime.   levalbuterol  0.63 MG/3ML nebulizer solution Commonly known as:  XOPENEX Take 3 mLs (0.63 mg total) by nebulization every 8 (eight) hours as needed for wheezing or shortness of breath.   oxyCODONE 5 MG immediate release tablet Commonly known as:  Oxy IR/ROXICODONE Take 1 tablet (5 mg total) by mouth every 4 (four) hours as needed for severe pain.   Potassium Chloride ER 20 MEQ Tbcr Take 20 mEq by mouth 2 (two) times daily.   rosuvastatin 20 MG tablet Commonly known as:  CRESTOR Take 1 tablet (20 mg total) by mouth daily.      Follow-up Information    [name], [name], DO. Call in 1 day(s).   Specialty:  Family Medicine Contact information: [place] [address] [phone]        Gaye Pollack, MD Follow up.   Specialty:  Cardiothoracic Surgery Why:  Your appointment is on April 3rd at 9:30am. Please arrive at 9:00am for a chest xray located at [facility] which is on the first floor of our building.  Contact information: [address] [place] [address] [place], [place], PA-C Follow up.   Specialties:  Physician Assistant, Cardiology Why:  Bernerd Pho, PA-C 3/22 @3pm  [facility])  Contact information: [address] [place] [address] [phone]        nursing Follow up.   Why:  Your suture removal appointment is on 12/06/2017 at 10:00am Contact information: Dr. Vivi Martens office          The patient has been discharged  on:   1.Beta Blocker:  Yes [ x  ]                              No   [   ]                              If No, reason:  2.Ace Inhibitor/ARB: Yes [   ]                                     No  [  x  ]                                     If No, reason: titration of BB  3.Statin:   Yes [ x  ]                  No  [   ]                  If No, reason:  4.Ecasa:  Yes  [ x  ]                  No   [   ]                  If No, reason:    1. Please obtain vital signs at least one time daily 2. Please weigh the patient daily. If he or she continues to gain weight or develops lower extremity edema, contact the office at [phone]. 3. Ambulate patient at least three times daily  and please use sternal precautions. 4. Wash incision with soap and water, pat dry with a clean towel.    Signed: Elgie Collard 11/29/2017, 11:01 AM

## 2017-11-29 NOTE — Telephone Encounter (Signed)
Spoke w/ pt and requested that she send a manual transmission b/c her home monitor has not updated in at least 14 days.   

## 2017-11-29 NOTE — Progress Notes (Signed)
Patient arrived to unit @ 1400 via wheelchair and oxygen.  Patient oriented to the unit. Reviewed medications, plan of care and Rehab routine. States and understand information and reviewed.

## 2017-11-29 NOTE — Progress Notes (Signed)
      [address]       [place], [address]             [phone]      6 Days Post-Op Procedure(s) (LRB): CORONARY ARTERY BYPASS GRAFTING (CABG) x 2, ON PUMP, USING LEFT INTERNAL MAMMARY ARTERY AND RIGHT GREATER SAPHENOUS VEIN HARVESTED ENDOSCOPICALLY (N/A) TRANSESOPHAGEAL ECHOCARDIOGRAM (TEE) (N/A) Subjective: She feels okay this morning. She has not been able to sleep and still has some chest pressure in the center of her chest.   Objective: Vital signs in last 24 hours: Temp:  [97.6 F (36.4 C)-98.9 F (37.2 C)] 98.9 F (37.2 C) (03/06 0407) Pulse Rate:  [85-98] 88 (03/05 2105) Cardiac Rhythm: Normal sinus rhythm (03/06 0700) Resp:  [12-28] 14 (03/05 2105) BP: (112-125)/(65-93) 115/82 (03/06 0407) SpO2:  [95 %-99 %] 99 % (03/05 2105) Weight:  [133 lb 6.1 oz (60.5 kg)] 133 lb 6.1 oz (60.5 kg) (03/06 0407)     Intake/Output from previous day: 03/05 0701 - 03/06 0700 In: 1660 [P.O.:1660] Out: 1801 [Urine:1800; Stool:1] Intake/Output this shift: No intake/output data recorded.  General appearance: alert, cooperative and no distress Heart: RRR, soft systolic murmur Lungs: clear to auscultation bilaterally Abdomen: soft, non-tender; bowel sounds normal; no masses,  no organomegaly Extremities: extremities normal, atraumatic, no cyanosis or edema Wound: clean and dry  Lab Results: Recent Labs    11/27/17 0326  WBC 7.8  HGB 8.4*  HCT 26.3*  PLT 251   BMET:  Recent Labs    11/27/17 0326  NA 137  K 3.7  CL 99*  CO2 29  GLUCOSE 92  BUN 9  CREATININE 0.68  CALCIUM 8.1*    PT/INR: No results for input(s): LABPROT, INR in the last 72 hours. ABG    Component Value Date/Time   PHART 7.341 (L) 11/23/2017 2320   HCO3 23.7 11/23/2017 2320   TCO2 26 11/24/2017 1619   ACIDBASEDEF 2.0 11/23/2017 2320   O2SAT 70.0 11/27/2017 0340   CBG (last 3)  Recent Labs    11/27/17 0730 11/27/17 1214 11/27/17 2239  GLUCAP 85 121* 117*     Assessment/Plan: S/P Procedure(s) (LRB): CORONARY ARTERY BYPASS GRAFTING (CABG) x 2, ON PUMP, USING LEFT INTERNAL MAMMARY ARTERY AND RIGHT GREATER SAPHENOUS VEIN HARVESTED ENDOSCOPICALLY (N/A) TRANSESOPHAGEAL ECHOCARDIOGRAM (TEE) (N/A)  1. CV-NSR in the 11s. BP well controlled. Tolerating Coreg, ASA, and Crestor. Arrhythmia noted from yesterday. EPW removed, increased Coreg  2. Pulm-98% oxygen saturation on 2L. Continue incentive spirometer.  3. Renal-creatinine is 0.68, electrolytes okay. Remains fluid overloaded. Continue diuretic.  4. H and H stable, platelets okay 5. Endo-well controlled blood glucose level. 6. Previous stroke- continue PT/OT. PT recommending CIR.   Plan: Work on ambulation today. Wean oxygen as tolerated. Work on increasing oral intake. CIR consult placed and awaiting evaluation.      LOS: 13 days    Elgie Collard 11/29/2017

## 2017-11-29 NOTE — Progress Notes (Signed)
Cristina Gong, RN  Rehab Admission Coordinator  Physical Medicine and Rehabilitation  PMR Pre-admission  Signed  Date of Service:  11/29/2017 11:05 AM       Related encounter: ED to Hosp-Admission (Current) from 11/16/2017 in [facility] 4E CV SURGICAL PROGRESSIVE CARE      Signed           [] Hide copied text  [] Hover for details   PMR Admission Coordinator Pre-Admission Assessment  Patient: [name] is an 57 y.o., female MRN: [id number] DOB: May 15, 1961 Height: 5\' 2"  (157.5 cm) Weight: 60.5 kg (133 lb 6.1 oz)                                                                                                                                                  Insurance Information HMO:     PPO:      PCP:      IPA:      80/20:      OTHER: ACA policy PRIMARY: [employer] [place]      Policy#: [id number]      Subscriber: pt CM Name: Elspeth Cho      Phone#: [phone]     Fax#: [phone] Pre-Cert#: [id number] approved 3/6 until 3/20 when updates are due 12/12/17      Employer: none Benefits:  Phone #: [phone]     Name: 11/28/17 Eff. Date: 09/26/2017     Deduct: $200      Out of Pocket Max: $600      Life Max: none CIR: 70%      SNF: 70% 60 days Outpatient: $20 co pay pay per visit     Co-Pay: 30 visits each PT, OT, and [place]: 70%      Co-Pay: visits per medical neccesity DME: 70%     Co-Pay: 30% Providers: in network  SECONDARY: none        Medicaid Application Date:       Case Manager:  Disability Application Date:       Case Worker:   Emergency Contact Information        Contact Information    Name Relation Home Work Mobile   Reine,ROBERT L Spouse   Continental, Amberg Brother   [phone]     Current Medical History  Patient Admitting Diagnosis: debility  History of Present Illness:  OYD:XAJOIN G Griffinis a 57 y.o.right handed femalewith history of tobacco abuse, 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  Presented 11/16/2017 with increasing shortness of breath. Chest x-ray suspicious for aspiration pneumonia. Troponin 0.4. Echocardiogram showed ST depressions in the anterior lateral leads. Echocardiogram with left ventricular systolic dysfunction with ejection fraction of 40%. Diffuse hypokinesis. Grade 2 diastolic dysfunction.  Cardiac catheterization showed 75% mid to distal left main stenosis with significant calcification. There was 50% proximal left circumflex stenosis. Underwent CABG 2. 11/24/2017 per Dr. Cyndia Bent. Hospital course pain management. Sternal precautions as directed. Acute blood loss anemia 8.4 and monitored. Subcutaneous Lovenox for DVT prophylaxis. Tolerating a regular diet.    Past Medical History      Past Medical History:  Diagnosis Date  . Arthritis    "knees" (11/16/2017)  . Hay fever   . Pneumonia 11/16/2017  . Stroke [facility]) 08/23/2017   Left MCA infarct status post TPA and thrombectomy    Family History  family history includes Diabetes type II in her mother; Heart attack in her father; Hyperlipidemia in her mother; Hypertension in her brother and mother.  Prior Rehab/Hospitalizations:  Has the patient had major surgery during 100 days prior to admission? No  Current Medications   Current Facility-Administered Medications:  .  0.9 %  sodium chloride infusion, 250 mL, Intravenous, PRN, Gaye Pollack, MD .  aspirin EC tablet 325 mg, 325 mg, Oral, Daily, Gaye Pollack, MD, 325 mg at 11/29/17 1051 .  baclofen (LIORESAL) tablet 5 mg, 5 mg, Oral, TID, Lars Pinks M, PA-C, 5 mg at 11/29/17 1052 .  bisacodyl (DULCOLAX) EC tablet 10 mg, 10 mg, Oral, Daily PRN **OR** bisacodyl (DULCOLAX) suppository 10 mg, 10 mg, Rectal, Daily PRN, Gaye Pollack, MD .  carvedilol (COREG) tablet 6.25 mg, 6.25 mg,  Oral, BID WC, Barrett, Erin R, PA-C, 6.25 mg at 11/29/17 0836 .  docusate sodium (COLACE) capsule 200 mg, 200 mg, Oral, Daily, Gaye Pollack, MD, 200 mg at 11/29/17 1051 .  enoxaparin (LOVENOX) injection 40 mg, 40 mg, Subcutaneous, QHS, Gaye Pollack, MD, 40 mg at 11/28/17 2155 .  escitalopram (LEXAPRO) tablet 10 mg, 10 mg, Oral, Daily, Memon, Jehanzeb, MD, 10 mg at 11/29/17 1051 .  food thickener (THICK IT) powder, , Oral, PRN, Hongalgi, Anand D, MD .  furosemide (LASIX) tablet 40 mg, 40 mg, Oral, Daily, Melrose Nakayama, MD, 40 mg at 11/29/17 1050 .  guaiFENesin (MUCINEX) 12 hr tablet 1,200 mg, 1,200 mg, Oral, BID, Memon, Jehanzeb, MD, 1,200 mg at 11/29/17 1049 .  hydrocortisone cream 1 %, , Topical, TID PRN, Hongalgi, Anand D, MD .  hydrOXYzine (ATARAX/VISTARIL) tablet 25 mg, 25 mg, Oral, QHS, Memon, Jolaine Artist, MD, 25 mg at 11/28/17 2155 .  levalbuterol (XOPENEX) nebulizer solution 0.63 mg, 0.63 mg, Nebulization, Q8H PRN, Lars Pinks M, PA-C .  MEDLINE mouth rinse, 15 mL, Mouth Rinse, BID, Bartle, Fernande Boyden, MD, 15 mL at 11/29/17 1052 .  ondansetron (ZOFRAN) tablet 4 mg, 4 mg, Oral, Q6H PRN **OR** ondansetron (ZOFRAN) injection 4 mg, 4 mg, Intravenous, Q6H PRN, Gaye Pollack, MD .  oxyCODONE (Oxy IR/ROXICODONE) immediate release tablet 5-10 mg, 5-10 mg, Oral, Q3H PRN, Gaye Pollack, MD, 10 mg at 11/29/17 0505 .  pantoprazole (PROTONIX) EC tablet 40 mg, 40 mg, Oral, QAC breakfast, Gaye Pollack, MD, 40 mg at 11/29/17 0608 .  potassium chloride (K-DUR) CR tablet 20 mEq, 20 mEq, Oral, BID, Gaye Pollack, MD, 20 mEq at 11/29/17 1049 .  RESOURCE THICKENUP CLEAR, , Oral, PRN, Kathie Dike, MD .  rosuvastatin (CRESTOR) tablet 20 mg, 20 mg, Oral, Daily, Memon, Jehanzeb, MD, 20 mg at 11/29/17 1050 .  sodium chloride flush (NS) 0.9 % injection 3 mL, 3 mL, Intravenous, Q12H, Bartle, Fernande Boyden, MD, 3 mL at 11/29/17 1053 .  sodium chloride flush (  NS) 0.9 % injection 3 mL, 3 mL,  Intravenous, PRN, Gaye Pollack, MD .  traMADol (ULTRAM) tablet 50 mg, 50 mg, Oral, Q6H PRN, Gaye Pollack, MD  Patients Current Diet: Diet heart healthy/carb modified Room service appropriate? Yes; Fluid consistency: Thin  Precautions / Restrictions Precautions Precautions: Fall, Sternal, Other (comment) Precaution Comments: Verbally reviewed sternal precautions; residual R-side weakness Restrictions Weight Bearing Restrictions: Yes RUE Weight Bearing: Non weight bearing(sternal precautions) LUE Weight Bearing: (sternal precautions) Other Position/Activity Restrictions: sternal precautions.     Has the patient had 2 or more falls or a fall with injury in the past year?No  Prior Activity Level Community (5-7x/wk): was receiving outpt therapy recently. not driving  Development worker, international aid / Equipment Home Assistive Devices/Equipment: Wheelchair, Radio producer (specify quad or straight), Built-in shower seat, Grab bars in shower, Grab bars around toilet, Eyeglasses Home Equipment: Environmental consultant - 2 wheels, Crutches, Wheelchair - manual, Cane - quad, Tub bench, Bedside commode  Prior Device Use: Indicate devices/aids used by the patient prior to current illness, exacerbation or injury? cane and wheelchair  Prior Functional Level Prior Function Level of Independence: Needs assistance Gait / Transfers Assistance Needed: Pt able to ambulate short distances at home and in community with quad cane; continues to have R foot drop. Mod indep with standing transfers into/out of wheelchair ADL's / Homemaking Assistance Needed: Mod indep with ADLs since d/c from CIR @ wc or cane level Comments: Has R resting hand splint   Self Care: Did the patient need help bathing, dressing, using the toilet or eating?  Needed some help  Indoor Mobility: Did the patient need assistance with walking from room to room (with or without device)? Needed some help  Stairs: Did the patient need assistance with  internal or external stairs (with or without device)? Needed some help  Functional Cognition: Did the patient need help planning regular tasks such as shopping or remembering to take medications? Needed some help  Current Functional Level Cognition  Overall Cognitive Status: Impaired/Different from baseline Current Attention Level: Sustained Orientation Level: Oriented X4 Safety/Judgement: Decreased awareness of safety, Decreased awareness of deficits General Comments: Pt with tangential conversation throughout.  Easily distracted. Forgetful of R arm.  Education throughout to maintain sternal precautions.    Extremity Assessment (includes Sensation/Coordination)  Upper Extremity Assessment: RUE deficits/detail RUE Deficits / Details: beginning to move out of synergy pattern into extension however not using functionally; mod flexor tone present  - greater distally RUE Sensation: decreased light touch, decreased proprioception RUE Coordination: decreased fine motor, decreased gross motor  Lower Extremity Assessment: Defer to PT evaluation RLE Deficits / Details: Grossly 3/5 throughout    ADLs  Overall ADL's : Needs assistance/impaired Grooming: Minimal assistance, Sitting Upper Body Bathing: Moderate assistance, Sitting Lower Body Bathing: Minimal assistance, Sit to/from stand Upper Body Dressing : Moderate assistance Lower Body Dressing: Moderate assistance, Sit to/from stand Toilet Transfer: Minimal assistance, Stand-pivot Toileting- Clothing Manipulation and Hygiene: Sit to/from stand, Moderate assistance Functional mobility during ADLs: Minimal assistance, +2 for physical assistance General ADL Comments: Pt requies continued cues for sternal precautions during ADL; vc to edirect attention    Mobility  Overal bed mobility: Needs Assistance Bed Mobility: Supine to Sit Supine to sit: Mod assist, +2 for safety/equipment General bed mobility comments: Pt standing with RN  leaving commode on arrival.      Transfers  Overall transfer level: Needs assistance Equipment used: 4-wheeled walker(EVA walker) Transfers: Sit to/from Stand(Pt only performed stand to sit as  she was standing with RN on arrival.  ) Sit to Stand: Mod assist General transfer comment: Cues for hand placement on her lap, and cues to address and maintain RUE so it does not wander during transfer.      Ambulation / Gait / Stairs / Wheelchair Mobility  Ambulation/Gait Ambulation/Gait assistance: Mod assist Ambulation Distance (Feet): 100 Feet(x2 trials ) Assistive device: 4-wheeled walker(EVA walker) Gait Pattern/deviations: Step-through pattern, Decreased stride length, Decreased dorsiflexion - right, Decreased stance time - right, Trunk flexed, Narrow base of support, Scissoring, Ataxic General Gait Details: Pt required cues for R foot clearance and R weight shifting in R stance phase.  Pt at times begins to drag R LE when fatigue sets in.  Pt with supinated R foot and ataxic scissoring.  VCs are not enough to brake down patient gait deviations.   Gait velocity interpretation: Below normal speed for age/gender    Posture / Balance Balance Overall balance assessment: Needs assistance Sitting balance-Leahy Scale: Fair Standing balance-Leahy Scale: Poor Standing balance comment: reliant on UE support.    Special needs/care consideration BiPAP/CPAP  N/a CPM  N/a Continuous Drip IV  N/a Dialysis  N/a Life Vest  N/a Oxygen 2 liters nasal cannula Special Bed  N/a Trach Size  N/a Wound Vac   N/a Skin surgical incisions; ecchymosis to BLE; rash to buttocks Bowel mgmt: continent LBM 3/4 Bladder mgmt: continent Diabetic mgmt  N/a   Previous Home Environment Living Arrangements: Spouse/significant other  Lives With: Spouse Available Help at Discharge: Family, Available 24 hours/day Type of Home: House Home Layout: One level Home Access: Stairs to enter Entrance Stairs-Rails:  None Entrance Stairs-Number of Steps: 3 Bathroom Shower/Tub: Public librarian, Multimedia programmer: Associate Professor Accessibility: Yes Home Care Services: (was recieivng oupt therapy. Had [place] initially) Additional Comments: Husband and other family available 24/7  Discharge Living Setting Plans for Discharge Living Setting: Patient's home, Lives with (comment)(spouse) Type of Home at Discharge: House Discharge Home Layout: One level Discharge Home Access: Stairs to enter Entrance Stairs-Rails: None Entrance Stairs-Number of Steps: 3 Discharge Bathroom Shower/Tub: Tub/shower unit, Walk-in shower Discharge Bathroom Toilet: Standard Discharge Bathroom Accessibility: Yes How Accessible: Accessible via walker Does the patient have any problems obtaining your medications?: (new with insurance 09/2017)  Social/Family/Support Systems Patient Roles: Spouse, Parent Contact Information: [name], spouse Anticipated Caregiver: spouse Anticipated Caregiver's Contact Information: see above Ability/Limitations of Caregiver: [name] is retired; no limitations Careers adviser: 24/7 Discharge Plan Discussed with Primary Caregiver: Yes Is Caregiver In Agreement with Plan?: Yes Does Caregiver/Family have Issues with Lodging/Transportation while Pt is in Rehab?: No  Goals/Additional Needs Patient/Family Goal for Rehab: superivison toi min assist with PT, OT, and SLP Expected length of stay: ELOS 13 to 17 days Pt/Family Agrees to Admission and willing to participate: Yes Program Orientation Provided & Reviewed with Pt/Caregiver Including Roles  & Responsibilities: Yes  Decrease burden of Care through IP rehab admission: n/a  Possible need for SNF placement upon discharge:not anticipated  Patient Condition: This patient's condition remains as documented in the consult dated 11/28/2017, in which the Rehabilitation Physician determined and documented that the  patient's condition is appropriate for intensive rehabilitative care in an inpatient rehabilitation facility. Will admit to inpatient rehab today.  Preadmission Screen Completed By:  Cleatrice Burke, 11/29/2017 11:05 AM ______________________________________________________________________   Discussed status with Dr. Posey Pronto on 11/29/2017 at  1113 and received telephone approval for admission today.  Admission Coordinator:  Cleatrice Burke, time 2355 Date  11/29/2017             Cosigned by: Jamse Arn, MD at 11/29/2017 11:50 AM  Revision History

## 2017-11-29 NOTE — IPOC Note (Signed)
Overall Plan of Care [facility]) Patient Details Name: [name] MRN: [id number] DOB: 30-Mar-1961  Admitting Diagnosis: Debility  Hospital Problems: Active Problems:   Debility   Shortness of breath   Essential hypertension   Leukocytosis   Hyponatremia     Functional Problem List: Nursing Pain, Endurance, Skin Integrity, Medication Management  PT Balance, Endurance, Motor, Pain, Safety  OT Balance, Endurance, Motor, Safety  SLP Cognition, Nutrition  TR         Basic ADL's: OT Bathing, Dressing, Toileting     Advanced  ADL's: OT       Transfers: PT Bed Mobility, Bed to Chair, Car, Manufacturing systems engineer, Metallurgist: PT Ambulation, Emergency planning/management officer, Stairs     Additional Impairments: OT Fuctional Use of Upper Extremity  SLP Swallowing, Communication, Social Cognition expression Problem Solving, Memory, Attention  TR      Anticipated Outcomes Item Anticipated Outcome  Self Feeding no goal  Swallowing  mod I with least restrictive diet    Basic self-care  supervision focusing on transfers  Toileting  supervision   Bathroom Transfers supervision  Bowel/Bladder  NA  Transfers  S  Locomotion  S  Communication  Supervision   Cognition  Supervision  Pain  at or below level 4  Safety/Judgment      Therapy Plan: PT Intensity: Minimum of 1-2 x/day ,45 to 90 minutes PT Frequency: 5 out of 7 days PT Duration Estimated Length of Stay: 7-10 days OT Intensity: Minimum of 1-2 x/day, 45 to 90 minutes OT Frequency: 5 out of 7 days OT Duration/Estimated Length of Stay: 7-10 days SLP Intensity: Minumum of 1-2 x/day, 30 to 90 minutes SLP Frequency: 3 to 5 out of 7 days SLP Duration/Estimated Length of Stay: 10-12 days     Team Interventions: Nursing Interventions Skin Care/Wound Management, Pain Management, Medication Management, Discharge Planning, Patient/Family Education, Disease Management/Prevention  PT interventions Ambulation/gait  training, Functional mobility training, Therapeutic Activities, Wheelchair propulsion/positioning, Therapeutic Exercise, Training and development officer, DME/adaptive equipment instruction, Pain management, Stair training, Patient/family education, Functional electrical stimulation  OT Interventions Balance/vestibular training, Cognitive remediation/compensation, Discharge planning, DME/adaptive equipment instruction, Functional mobility training, Neuromuscular re-education, Self Care/advanced ADL retraining, Patient/family education, Therapeutic Exercise, Therapeutic Activities, UE/LE Strength taining/ROM, UE/LE Coordination activities  SLP Interventions Cognitive remediation/compensation, Cueing hierarchy, Internal/external aids, Environmental controls, Dysphagia/aspiration precaution training, Patient/family education, Speech/Language facilitation, Functional tasks  TR Interventions    SW/CM Interventions Discharge Planning, Psychosocial Support, Patient/Family Education   Barriers to Discharge MD  Medical stability  Nursing      PT      OT      SLP      SW       Team Discharge Planning: Destination: PT-Home ,OT- Home , SLP-Home Projected Follow-up: PT-Home health PT, 24 hour supervision/assistance, OT-  Home health OT, SLP-Outpatient SLP, 24 hour supervision/assistance Projected Equipment Needs: PT-None recommended by PT, OT- None recommended by OT, SLP-None recommended by SLP Equipment Details: PT- , OT-  Patient/family involved in discharge planning: PT- Patient,  OT-Patient, SLP-Patient  MD ELOS: 7-10 days. Medical Rehab Prognosis:  Good Assessment: 57 y.o.right handed femalewith history of tobacco abuse, 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 Presented 11/16/2017 with increasing shortness of breath. Chest x-ray  suspicious for aspiration pneumonia. Troponin 0.4. Echocardiogram showed ST depressions in the anterior lateral leads. Echocardiogram with left  ventricular systolic dysfunction with ejection fraction of 40%. Diffuse hypokinesis. Grade 2 diastolic dysfunction. Cardiac catheterization showed 75% mid to distal left main stenosis with significant calcification. There was 50% proximal left circumflex stenosis. Underwent CABG 2. 11/24/2017 per Dr. Cyndia Bent. Hospital course pain management. Sternal precautions as directed. Acute blood loss anemia monitored. Patient with resulting functional deficits with mobility, safety, self-care, endurance.  Will set goals for Supervision with PT/OT.  See Team Conference Notes for weekly updates to the plan of care

## 2017-11-29 NOTE — Progress Notes (Signed)
Jamse Arn, MD  Physician  Physical Medicine and Rehabilitation  Consult Note  Addendum  Date of Service:  11/28/2017 9:12 AM       Related encounter: ED to Hosp-Admission (Current) from 11/16/2017 in [facility] 4E CV SURGICAL PROGRESSIVE CARE      Expand All Collapse All      [] Hide copied text  [] Hover for details        Physical Medicine and Rehabilitation Consult Reason for Consult: Decreased functional mobility Referring Physician: Dr. Cyndia Bent   HPI: [name] is a 57 y.o. 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 Per chart review and patient, patient lives with spouse. One level home with 3 steps to entry. Ambulated short distances in the home and community with a quad cane. Patient did have a right foot drop. Modified independent with standing transfers into and out of wheelchair. Modified independence with ADLs. Presented 11/16/2017 with increasing shortness of breath. Chest x-ray suspicious for aspiration pneumonia. Troponin 0.4. Echocardiogram showed ST depressions in the anterior lateral leads. Echocardiogram with left ventricular systolic dysfunction with ejection fraction of 40%. Diffuse hypokinesis. Grade 2 diastolic dysfunction. Cardiac catheterization showed 75% mid to distal left main stenosis with significant calcification. There was 50% proximal left circumflex stenosis. Underwent CABG 2. 11/24/2017 per Dr. Cyndia Bent. Hospital course pain management. Sternal precautions as directed. Acute blood loss anemia 8.4 and monitored. Subcutaneous Lovenox for DVT prophylaxis. Tolerating a regular diet. Physical therapy evaluation completed with recommendations of physical medicine rehabilitation consult.   Review of Systems  Constitutional:  Negative for chills and fever.  HENT: Negative for hearing loss.   Eyes: Negative for blurred vision.  Respiratory: Positive for cough and shortness of breath.   Cardiovascular: Negative for chest pain and palpitations.  Gastrointestinal: Positive for constipation. Negative for nausea and vomiting.  Genitourinary: Negative for dysuria and hematuria.  Musculoskeletal: Positive for joint pain and myalgias.  Skin: Negative for rash.  Neurological: Positive for sensory change, speech change, focal weakness and weakness. Negative for seizures.  All other systems reviewed and are negative.      Past Medical History:  Diagnosis Date  . Arthritis    "knees" (11/16/2017)  . Hay fever   . Pneumonia 11/16/2017  . Stroke [facility]) 08/23/2017   Left MCA infarct status post TPA and thrombectomy        Past Surgical History:  Procedure Laterality Date  . CORONARY ARTERY BYPASS GRAFT N/A 11/23/2017   Procedure: CORONARY ARTERY BYPASS GRAFTING (CABG) x 2, ON PUMP, USING LEFT INTERNAL MAMMARY ARTERY AND RIGHT GREATER SAPHENOUS VEIN HARVESTED ENDOSCOPICALLY;  Surgeon: Gaye Pollack, MD;  Location: [place];  Service: Open Heart Surgery;  Laterality: N/A;  . CRYOABLATION     cervical  . IR ANGIO INTRA EXTRACRAN SEL COM CAROTID INNOMINATE UNI R MOD SED  08/21/2017  . IR ANGIO VERTEBRAL SEL SUBCLAVIAN INNOMINATE UNI R MOD SED  08/21/2017  . IR PERCUTANEOUS ART THROMBECTOMY/INFUSION INTRACRANIAL INC DIAG ANGIO  08/21/2017  . IR RADIOLOGIST EVAL & MGMT  10/30/2017  . LOOP RECORDER INSERTION N/A 08/28/2017   Procedure: LOOP RECORDER INSERTION;  Surgeon: Constance Haw, MD;  Location: [place] CV LAB;  Service: Cardiovascular;  Laterality: N/A;  . RADIOLOGY WITH ANESTHESIA N/A 08/21/2017   Procedure: RADIOLOGY WITH ANESTHESIA;  Surgeon: Luanne Bras, MD;  Location: [place];  Service: Radiology;  Laterality: N/A;  . RIGHT/LEFT HEART CATH AND CORONARY ANGIOGRAPHY N/A 11/20/2017    Procedure: RIGHT/LEFT HEART CATH AND CORONARY ANGIOGRAPHY;  Surgeon: Jettie Booze, MD;  Location: [place] CV LAB;  Service: Cardiovascular;  Laterality: N/A;  . TEE WITHOUT CARDIOVERSION N/A 08/28/2017   Procedure: TRANSESOPHAGEAL ECHOCARDIOGRAM (TEE);  Surgeon: Fay Records, MD;  Location: [place];  Service: Cardiovascular;  Laterality: N/A;  . TEE WITHOUT CARDIOVERSION N/A 11/23/2017   Procedure: TRANSESOPHAGEAL ECHOCARDIOGRAM (TEE);  Surgeon: Gaye Pollack, MD;  Location: [place];  Service: Open Heart Surgery;  Laterality: N/A;  . TUBAL LIGATION          Family History  Problem Relation Age of Onset  . Heart attack Father   . Hypertension Mother   . Hyperlipidemia Mother   . Diabetes type II Mother   . Hypertension Brother    Social History:  reports that she quit smoking about 3 months ago. Her smoking use included cigarettes. She has a 4.44 pack-year smoking history. she has never used smokeless tobacco. She reports that she does not drink alcohol or use drugs. Allergies:       Allergies  Allergen Reactions  . Lopressor [Metoprolol Tartrate] Swelling    Angioedema  . Lipitor [Atorvastatin] Diarrhea         Medications Prior to Admission  Medication Sig Dispense Refill  . aspirin 325 MG EC tablet Take 1 tablet (325 mg total) by mouth daily. 90 tablet 3  . baclofen (LIORESAL) 10 MG tablet Take 0.5 tablets (5 mg total) by mouth 3 (three) times daily. 45 tablet 1  . cetirizine (ZYRTEC) 10 MG tablet Take 10 mg by mouth daily.    [name] escitalopram (LEXAPRO) 10 MG tablet Take 1 tablet (10 mg total) by mouth daily. 90 tablet 1  . famotidine (PEPCID) 20 MG tablet Take 1 tablet (20 mg total) by mouth 2 (two) times daily. 180 tablet 3  . hydrOXYzine (ATARAX/VISTARIL) 25 MG tablet Take 1 tablet (25 mg total) by mouth at bedtime. 90 tablet 1  . rosuvastatin (CRESTOR) 20 MG tablet Take 1 tablet (20 mg total) by mouth daily. 90 tablet 3    Home: Home  Living Family/patient expects to be discharged to:: Private residence Living Arrangements: Spouse/significant other Available Help at Discharge: Family, Available 24 hours/day Type of Home: House Home Access: Stairs to enter [employer] of Steps: 3 Entrance Stairs-Rails: None Home Layout: One level Bathroom Shower/Tub: Public librarian, Multimedia programmer: Standard Bathroom Accessibility: Yes Home Equipment: Environmental consultant - 2 wheels, Crutches, Wheelchair - manual, [employer] - quad, Tub bench, Bedside commode Additional Comments: Husband and other family available 24/7  Functional History: Prior Function Level of Independence: Needs assistance Gait / Transfers Assistance Needed: Pt able to ambulate short distances at home and in community with quad cane; continues to have R foot drop. Mod indep with standing transfers into/out of wheelchair ADL's / Homemaking Assistance Needed: Mod indep with ADLs since d/c from CIR @ wc or cane level Comments: Has R resting hand splint  Functional Status:  Mobility: Bed Mobility Overal bed mobility: Needs Assistance Bed Mobility: Supine to Sit Supine to sit: Mod assist, +2 for safety/equipment General bed mobility comments: OOB in chair Transfers Overall transfer level: Needs assistance Equipment used: 1 person hand held assist(EVA walker) Transfers: Sit to/from Stand, Stand Pivot Transfers Sit to Stand: Mod assist General transfer comment: States she feels moe "off balance" than normal Ambulation/Gait  Ambulation/Gait assistance: Mod assist Ambulation Distance (Feet): 80 Feet(x3 trials, required rest breaks inbetween trials as HR elevated to 126 bpm.  ) Assistive device: 4-wheeled walker(EVA walker) Gait Pattern/deviations: Step-through pattern, Decreased stride length, Decreased dorsiflexion - right, Decreased stance time - right, Trunk flexed, Narrow base of support, Scissoring, Ataxic General Gait Details: Pt required cues for R  foot clearance and R weight shifting in R stance phase.  Pt at times begins to drag R LE when fatigue sets in.   Gait velocity interpretation: Below normal speed for age/gender  ADL: ADL Overall ADL's : Needs assistance/impaired Grooming: Minimal assistance, Sitting Upper Body Bathing: Moderate assistance, Sitting Lower Body Bathing: Minimal assistance, Sit to/from stand Upper Body Dressing : Moderate assistance Lower Body Dressing: Moderate assistance, Sit to/from stand Toilet Transfer: Minimal assistance, Stand-pivot Toileting- Clothing Manipulation and Hygiene: Sit to/from stand, Moderate assistance Functional mobility during ADLs: Minimal assistance, +2 for physical assistance General ADL Comments: Pt requies continued cues for sternal precautions during ADL; vc to edirect attention  Cognition: Cognition Overall Cognitive Status: Impaired/Different from baseline Orientation Level: Oriented X4 Cognition Arousal/Alertness: Awake/alert Behavior During Therapy: Anxious, Impulsive Overall Cognitive Status: Impaired/Different from baseline Area of Impairment: Problem solving, Safety/judgement, Attention, Memory Current Attention Level: Sustained Memory: Decreased recall of precautions Safety/Judgement: Decreased awareness of safety, Decreased awareness of deficits Problem Solving: Slow processing, Difficulty sequencing, Requires verbal cues, Requires tactile cues General Comments: Pt with tangential conversation throughout.  Easily distracted. Forgetful of R arm.  Education throughout to maintain sternal precautions.  Blood pressure (!) 120/93, pulse 98, temperature 97.6 F (36.4 C), temperature source Oral, resp. rate (!) 26, height 5\' 2"  (1.575 m), weight 58.7 kg (129 lb 6.1 oz), SpO2 96 %. Physical Exam  Vitals reviewed. Constitutional: She is oriented to person, place, and time. She appears well-developed and well-nourished.  HENT:  Head: Normocephalic and atraumatic.   Eyes: EOM are normal. Right eye exhibits no discharge. Left eye exhibits no discharge.  Neck: Normal range of motion. Neck supple. No thyromegaly present.  Cardiovascular: Normal rate and regular rhythm.  Respiratory: Effort normal and breath sounds normal. No respiratory distress.  +[place]  GI: Soft. Bowel sounds are normal. She exhibits no distension.  Musculoskeletal:  No edema or tenderness in extremities  Neurological: She is alert and oriented to person, place, and time.  Follows commands. Motor: RUE: 2-/5 proximal to distal with apraxia RLE: 4-/5 HF, KE 3+/5 ADF  Skin:  Midline chest incision healing  Psychiatric: She has a normal mood and affect. Her behavior is normal.    LabResultsLast24Hours       Results for orders placed or performed during the hospital encounter of 11/16/17 (from the past 24 hour(s))  Glucose, capillary     Status: Abnormal   Collection Time: 11/27/17 12:14 PM  Result Value Ref Range   Glucose-Capillary 121 (H) 65 - 99 mg/dL   Comment 1 Capillary Specimen   Glucose, capillary     Status: Abnormal   Collection Time: 11/27/17 10:39 PM  Result Value Ref Range   Glucose-Capillary 117 (H) 65 - 99 mg/dL   Comment 1 Capillary Specimen      ImagingResults(Last48hours)  No results found.    Assessment/Plan: Diagnosis: Debility Labs independently reviewed.  Records reviewed and summated above.  1. Does the need for close, 24 hr/day medical supervision in concert with the patient's rehab needs make it unreasonable for this patient to be served in a less intensive setting? Yes  2. Co-Morbidities requiring supervision/potential  complications: Acute blood loss anemia (transfuse if necessary to ensure appropriate perfusion for increased activity tolerance), Tachycardia (monitor in accordance with pain and increasing activity) 3. , post-op pain management (Biofeedback training with therapies to help reduce reliance on opiate pain  medications, monitor pain control during therapies, and sedation at rest and titrate to maximum efficacy to ensure participation and gains in therapies), diastolic dysfunction (monitor for signs/symptoms of fluid overload), tobacco abuse (counsel), history of left middle cerebral artery CVA with residual deficits, hyperglycemia (Monitor in accordance with exercise and adjust meds as necessary), wean supplement oxygen as tolerated 4. Due to safety, skin/wound care, disease management, pain management and patient education, does the patient require 24 hr/day rehab nursing? Yes 5. Does the patient require coordinated care of a physician, rehab nurse, PT (1-2 hrs/day, 5 days/week) and OT (1-2 hrs/day, 5 days/week) to address physical and functional deficits in the context of the above medical diagnosis(es)? Yes Addressing deficits in the following areas: balance, endurance, locomotion, strength, transferring, bathing, dressing, toileting and psychosocial support 6. Can the patient actively participate in an intensive therapy program of at least 3 hrs of therapy per day at least 5 days per week? Yes 7. The potential for patient to make measurable gains while on inpatient rehab is excellent 8. Anticipated functional outcomes upon discharge from inpatient rehab are supervision and min assist  with PT, supervision and min assist with OT, n/a with SLP. 9. Estimated rehab length of stay to reach the above functional goals is: 13-17 days. 10. Anticipated D/C setting: Home 11. Anticipated post D/C treatments: HH therapy and Home excercise program 12. Overall Rehab/Functional Prognosis: good  RECOMMENDATIONS: This patient's condition is appropriate for continued rehabilitative care in the following setting: CIR Patient has agreed to participate in recommended program. Yes Note that insurance prior authorization may be required for reimbursement for recommended care.  Comment: Rehab Admissions Coordinator to  follow up.  Delice Lesch, MD, ABPMR Lavon Paganini Angiulli, PA-C 11/28/2017      Revision History                             Routing History

## 2017-11-29 NOTE — Discharge Instructions (Signed)
Femoral Site Care  Refer to this sheet in the next few weeks. These instructions provide you with information about caring for yourself after your procedure. Your health care provider may also give you more specific instructions. Your treatment has been planned according to current medical practices, but problems sometimes occur. Call your health care provider if you have any problems or questions after your procedure. What can I expect after the procedure? After your procedure, it is typical to have the following:  Bruising at the site that usually fades within 1-2 weeks.  Blood collecting in the tissue (hematoma) that may be painful to the touch. It should usually decrease in size and tenderness within 1-2 weeks.  Follow these instructions at home:  Take medicines only as directed by your health care provider.  You may shower 24-48 hours after the procedure or as directed by your health care provider. Remove the bandage (dressing) and gently wash the site with plain soap and water. Pat the area dry with a clean towel. Do not rub the site, because this may cause bleeding.  Do not take baths, swim, or use a hot tub until your health care provider approves.  Check your insertion site every day for redness, swelling, or drainage.  Do not apply powder or lotion to the site.  Limit use of stairs to twice a day for the first 2-3 days or as directed by your health care provider.  Do not squat for the first 2-3 days or as directed by your health care provider.  Do not lift over 10 lb (4.5 kg) for 5 days after your procedure or as directed by your health care provider.  Ask your health care provider when it is okay to: ? Return to work or school. ? Resume usual physical activities or sports. ? Resume sexual activity.  Do not drive home if you are discharged the same day as the procedure. Have someone else drive you.  You may drive 24 hours after the procedure unless otherwise instructed by  your health care provider.  Do not operate machinery or power tools for 24 hours after the procedure or as directed by your health care provider.  If your procedure was done as an outpatient procedure, which means that you went home the same day as your procedure, a responsible adult should be with you for the first 24 hours after you arrive home.  Keep all follow-up visits as directed by your health care provider. This is important. Contact a health care provider if:  You have a fever.  You have chills.  You have increased bleeding from the site. Hold pressure on the site. Get help right away if:  You have unusual pain at the site.  You have redness, warmth, or swelling at the site.  You have drainage (other than a small amount of blood on the dressing) from the site.  The site is bleeding, and the bleeding does not stop after 30 minutes of holding steady pressure on the site.  Your leg or foot becomes pale, cool, tingly, or numb. This information is not intended to replace advice given to you by your health care provider. Make sure you discuss any questions you have with your health care provider. Document Released: 05/16/2014 Document Revised: 02/18/2016 Document Reviewed: 04/01/2014 Elsevier Interactive Patient Education  2018 [employer].    Coronary Artery Bypass Grafting, Care After  This sheet gives you information about how to care for yourself after your procedure. Your health care  provider may also give you more specific instructions. If you have problems or questions, contact your health care provider. What can I expect after the procedure? After the procedure, it is common to have:  Nausea and a lack of appetite.  Constipation.  Weakness and fatigue.  Depression or irritability.  Pain or discomfort in your incision areas.  Follow these instructions at home: Medicines  Take over-the-counter and prescription medicines only as told by your health care  provider. Do not stop taking medicines or start any new medicines without approval from your health care provider.  If you were prescribed an antibiotic medicine, take it as told by your health care provider. Do not stop taking the antibiotic even if you start to feel better.  Do not drive or use heavy machinery while taking prescription pain medicine. Incision care  Follow instructions from your health care provider about how to take care of your incisions. Make sure you: ? Wash your hands with soap and water before you change your bandage (dressing). If soap and water are not available, use hand sanitizer. ? Change your dressing as told by your health care provider. ? Leave stitches (sutures), skin glue, or adhesive strips in place. These skin closures may need to stay in place for 2 weeks or longer. If adhesive strip edges start to loosen and curl up, you may trim the loose edges. Do not remove adhesive strips completely unless your health care provider tells you to do that.  Keep incision areas clean, dry, and protected.  Check your incision areas every day for signs of infection. Check for: ? More redness, swelling, or pain. ? More fluid or blood. ? Warmth. ? Pus or a bad smell.  If incisions were made in your legs: ? Avoid crossing your legs. ? Avoid sitting for long periods of time. Change positions every 30 minutes. ? Raise (elevate) your legs when you are sitting. Bathing  Do not take baths, swim, or use a hot tub until your health care provider approves.  Only take sponge baths. Pat the incisions dry. Do not rub incisions with a washcloth or towel.  Ask your health care provider when you can shower. Eating and drinking  Eat foods that are high in fiber, such as raw fruits and vegetables, whole grains, beans, and nuts. Meats should be lean cut. Avoid canned, processed, and fried foods. This can help prevent constipation and is a recommended part of a heart-healthy  diet.  Drink enough fluid to keep your urine clear or pale yellow.  Limit alcohol intake to no more than 1 drink a day for nonpregnant women and 2 drinks a day for men. One drink equals 12 oz of beer, 5 oz of wine, or 1 oz of hard liquor. Activity  Rest and limit your activity as told by your health care provider. You may be instructed to: ? Stop any activity right away if you have chest pain, shortness of breath, irregular heartbeats, or dizziness. Get help right away if you have any of these symptoms. ? Move around frequently for short periods or take short walks as directed by your health care provider. Gradually increase your activities. You may need physical therapy or cardiac rehabilitation to help strengthen your muscles and build your endurance. ? Avoid lifting, pushing, or pulling anything that is heavier than 10 lb (4.5 kg) for at least 6 weeks or as told by your health care provider.  Do not drive until your health care provider approves.  Ask your health care provider when you may return to work.  Ask your health care provider when you may resume sexual activity. General instructions  Do not use any products that contain nicotine or tobacco, such as cigarettes and e-cigarettes. If you need help quitting, ask your health care provider.  Take 2-3 deep breaths every few hours during the day, while you recover. This helps expand your lungs and prevent complications like pneumonia after surgery.  If you were given a device called an incentive spirometer, use it several times a day to practice deep breathing. Support your chest with a pillow or your arms when you take deep breaths or cough.  Wear compression stockings as told by your health care provider. These stockings help to prevent blood clots and reduce swelling in your legs.  Weigh yourself every day. This helps identify if your body is holding (retaining) fluid that may make your heart and lungs work harder.  Keep all  follow-up visits as told by your health care provider. This is important. Contact a health care provider if:  You have more redness, swelling, or pain around any incision.  You have more fluid or blood coming from any incision.  Any incision feels warm to the touch.  You have pus or a bad smell coming from any incision  You have a fever.  You have swelling in your ankles or legs.  You have pain in your legs.  You gain 2 lb (0.9 kg) or more a day.  You are nauseous or you vomit.  You have diarrhea. Get help right away if:  You have chest pain that spreads to your jaw or arms.  You are short of breath.  You have a fast or irregular heartbeat.  You notice a "clicking" in your breastbone (sternum) when you move.  You have numbness or weakness in your arms or legs.  You feel dizzy or light-headed. Summary  After the procedure, it is common to have pain or discomfort in the incision areas.  Do not take baths, swim, or use a hot tub until your health care provider approves.  Gradually increase your activities. You may need physical therapy or cardiac rehabilitation to help strengthen your muscles and build your endurance.  Weigh yourself every day. This helps identify if your body is holding (retaining) fluid that may make your heart and lungs work harder. This information is not intended to replace advice given to you by your health care provider. Make sure you discuss any questions you have with your health care provider. Document Released: 04/01/2005 Document Revised: 08/01/2016 Document Reviewed: 08/01/2016 Elsevier Interactive Patient Education  [employer].

## 2017-11-29 NOTE — Progress Notes (Signed)
Pt transported to CIR (4W07) at [facility], provided handoff to receiving nurse Barbara.  Vital signs stable, patient needs met with pain medication (oxicodone 10 mg) given prior to transfer.  Pt was ambulated 420 ft with AV Assist in hall prior to transfer. 

## 2017-11-29 NOTE — PMR Pre-admission (Signed)
PMR Admission Coordinator Pre-Admission Assessment  Patient: [name] is an 57 y.o., female MRN: [id number] DOB: 1960-12-15 Height: 5\' 2"  (157.5 cm) Weight: 60.5 kg (133 lb 6.1 oz)              Insurance Information HMO:     PPO:      PCP:      IPA:      80/20:      OTHER: ACA policy PRIMARY: [employer] [place]      Policy#: [id number]      Subscriber: pt CM Name: Elspeth Cho      Phone#: [phone]     Fax#: [phone] Pre-Cert#: [id number] approved 3/6 until 3/20 when updates are due 12/12/17      Employer: none Benefits:  Phone #: [phone]     Name: 11/28/17 Eff. Date: 09/26/2017     Deduct: $200      Out of Pocket Max: $600      Life Max: none CIR: 70%      SNF: 70% 60 days Outpatient: $20 co pay pay per visit     Co-Pay: 30 visits each PT, OT, and [place]: 70%      Co-Pay: visits per medical neccesity DME: 70%     Co-Pay: 30% Providers: in network  SECONDARY: none        Medicaid Application Date:       Case Manager:  Disability Application Date:       Case Worker:   Emergency Contact Information Contact Information    Name Relation Home Work Mobile   Miera,ROBERT L Spouse   [place], [place] Brother   [phone]     Current Medical History  Patient Admitting Diagnosis: debility  History of Present Illness:  HPI: [name] a 32 y.o.right handed femalewith history of tobacco abuse, 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  Presented 11/16/2017 with increasing shortness of breath. Chest x-ray suspicious for aspiration pneumonia. Troponin 0.4. Echocardiogram showed ST depressions in the anterior lateral leads. Echocardiogram with left ventricular systolic dysfunction with ejection fraction of 40%. Diffuse hypokinesis. Grade 2 diastolic dysfunction. Cardiac catheterization  showed 75% mid to distal left main stenosis with significant calcification. There was 50% proximal left circumflex stenosis. Underwent CABG 2. 11/24/2017 per Dr. Cyndia Bent. Hospital course pain management. Sternal precautions as directed. Acute blood loss anemia 8.4 and monitored. Subcutaneous Lovenox for DVT prophylaxis. Tolerating a regular diet.    Past Medical History  Past Medical History:  Diagnosis Date  . Arthritis    "knees" (11/16/2017)  . Hay fever   . Pneumonia 11/16/2017  . Stroke [facility]) 08/23/2017   Left MCA infarct status post TPA and thrombectomy    Family History  family history includes Diabetes type II in her mother; Heart attack in her father; Hyperlipidemia in her mother; Hypertension in her brother and mother.  Prior Rehab/Hospitalizations:  Has the patient had major surgery during 100 days prior to admission? No  Current Medications   Current Facility-Administered Medications:  .  0.9 %  sodium chloride infusion, 250 mL, Intravenous, PRN, Gaye Pollack, MD .  aspirin EC tablet 325 mg, 325 mg, Oral, Daily, Gaye Pollack, MD, 325 mg at 11/29/17 1051 .  baclofen (LIORESAL) tablet 5 mg, 5 mg, Oral, TID, Lars Pinks M, PA-C, 5 mg at 11/29/17 1052 .  bisacodyl (DULCOLAX) EC tablet 10 mg, 10 mg, Oral, Daily PRN **OR** bisacodyl (DULCOLAX) suppository 10 mg, 10 mg, Rectal, Daily PRN, Gaye Pollack, MD .  carvedilol (COREG) tablet 6.25 mg, 6.25 mg, Oral, BID WC, Barrett, Erin R, PA-C, 6.25 mg at 11/29/17 0836 .  docusate sodium (COLACE) capsule 200 mg, 200 mg, Oral, Daily, Gaye Pollack, MD, 200 mg at 11/29/17 1051 .  enoxaparin (LOVENOX) injection 40 mg, 40 mg, Subcutaneous, QHS, Gaye Pollack, MD, 40 mg at 11/28/17 2155 .  escitalopram (LEXAPRO) tablet 10 mg, 10 mg, Oral, Daily, Memon, Jehanzeb, MD, 10 mg at 11/29/17 1051 .  food thickener (THICK IT) powder, , Oral, PRN, Hongalgi, Anand D, MD .  furosemide (LASIX) tablet 40 mg, 40 mg, Oral, Daily,  Melrose Nakayama, MD, 40 mg at 11/29/17 1050 .  guaiFENesin (MUCINEX) 12 hr tablet 1,200 mg, 1,200 mg, Oral, BID, Memon, Jehanzeb, MD, 1,200 mg at 11/29/17 1049 .  hydrocortisone cream 1 %, , Topical, TID PRN, Hongalgi, Anand D, MD .  hydrOXYzine (ATARAX/VISTARIL) tablet 25 mg, 25 mg, Oral, QHS, Memon, Jolaine Artist, MD, 25 mg at 11/28/17 2155 .  levalbuterol (XOPENEX) nebulizer solution 0.63 mg, 0.63 mg, Nebulization, Q8H PRN, Lars Pinks M, PA-C .  MEDLINE mouth rinse, 15 mL, Mouth Rinse, BID, Bartle, Fernande Boyden, MD, 15 mL at 11/29/17 1052 .  ondansetron (ZOFRAN) tablet 4 mg, 4 mg, Oral, Q6H PRN **OR** ondansetron (ZOFRAN) injection 4 mg, 4 mg, Intravenous, Q6H PRN, Gaye Pollack, MD .  oxyCODONE (Oxy IR/ROXICODONE) immediate release tablet 5-10 mg, 5-10 mg, Oral, Q3H PRN, Gaye Pollack, MD, 10 mg at 11/29/17 0505 .  pantoprazole (PROTONIX) EC tablet 40 mg, 40 mg, Oral, QAC breakfast, Gaye Pollack, MD, 40 mg at 11/29/17 0608 .  potassium chloride (K-DUR) CR tablet 20 mEq, 20 mEq, Oral, BID, Gaye Pollack, MD, 20 mEq at 11/29/17 1049 .  RESOURCE THICKENUP CLEAR, , Oral, PRN, Kathie Dike, MD .  rosuvastatin (CRESTOR) tablet 20 mg, 20 mg, Oral, Daily, Memon, Jehanzeb, MD, 20 mg at 11/29/17 1050 .  sodium chloride flush (NS) 0.9 % injection 3 mL, 3 mL, Intravenous, Q12H, Bartle, Fernande Boyden, MD, 3 mL at 11/29/17 1053 .  sodium chloride flush (NS) 0.9 % injection 3 mL, 3 mL, Intravenous, PRN, Gaye Pollack, MD .  traMADol (ULTRAM) tablet 50 mg, 50 mg, Oral, Q6H PRN, Gaye Pollack, MD  Patients Current Diet: Diet heart healthy/carb modified Room service appropriate? Yes; Fluid consistency: Thin  Precautions / Restrictions Precautions Precautions: Fall, Sternal, Other (comment) Precaution Comments: Verbally reviewed sternal precautions; residual R-side weakness Restrictions Weight Bearing Restrictions: Yes RUE Weight Bearing: Non weight bearing(sternal precautions) LUE Weight  Bearing: (sternal precautions) Other Position/Activity Restrictions: sternal precautions.     Has the patient had 2 or more falls or a fall with injury in the past year?No  Prior Activity Level Community (5-7x/wk): was receiving outpt therapy recently. not driving  Development worker, international aid / Equipment Home Assistive Devices/Equipment: Wheelchair, Radio producer (specify quad or straight), Built-in shower seat, Grab bars in shower, Grab bars around toilet, Eyeglasses Home Equipment: Environmental consultant - 2 wheels, Crutches, Wheelchair - manual, Cane - quad, Tub bench, Bedside commode  Prior Device Use: Indicate devices/aids used by the patient prior to current illness, exacerbation or injury? cane and wheelchair  Prior Functional Level Prior Function Level of Independence: Needs assistance Gait / Transfers Assistance Needed: Pt able to ambulate short distances at home and in community with quad  cane; continues to have R foot drop. Mod indep with standing transfers into/out of wheelchair ADL's / Homemaking Assistance Needed: Mod indep with ADLs since d/c from CIR @ wc or cane level Comments: Has R resting hand splint   Self Care: Did the patient need help bathing, dressing, using the toilet or eating?  Needed some help  Indoor Mobility: Did the patient need assistance with walking from room to room (with or without device)? Needed some help  Stairs: Did the patient need assistance with internal or external stairs (with or without device)? Needed some help  Functional Cognition: Did the patient need help planning regular tasks such as shopping or remembering to take medications? Needed some help  Current Functional Level Cognition  Overall Cognitive Status: Impaired/Different from baseline Current Attention Level: Sustained Orientation Level: Oriented X4 Safety/Judgement: Decreased awareness of safety, Decreased awareness of deficits General Comments: Pt with tangential conversation throughout.  Easily  distracted. Forgetful of R arm.  Education throughout to maintain sternal precautions.    Extremity Assessment (includes Sensation/Coordination)  Upper Extremity Assessment: RUE deficits/detail RUE Deficits / Details: beginning to move out of synergy pattern into extension however not using functionally; mod flexor tone present  - greater distally RUE Sensation: decreased light touch, decreased proprioception RUE Coordination: decreased fine motor, decreased gross motor  Lower Extremity Assessment: Defer to PT evaluation RLE Deficits / Details: Grossly 3/5 throughout    ADLs  Overall ADL's : Needs assistance/impaired Grooming: Minimal assistance, Sitting Upper Body Bathing: Moderate assistance, Sitting Lower Body Bathing: Minimal assistance, Sit to/from stand Upper Body Dressing : Moderate assistance Lower Body Dressing: Moderate assistance, Sit to/from stand Toilet Transfer: Minimal assistance, Stand-pivot Toileting- Clothing Manipulation and Hygiene: Sit to/from stand, Moderate assistance Functional mobility during ADLs: Minimal assistance, +2 for physical assistance General ADL Comments: Pt requies continued cues for sternal precautions during ADL; vc to edirect attention    Mobility  Overal bed mobility: Needs Assistance Bed Mobility: Supine to Sit Supine to sit: Mod assist, +2 for safety/equipment General bed mobility comments: Pt standing with RN leaving commode on arrival.      Transfers  Overall transfer level: Needs assistance Equipment used: 4-wheeled walker(EVA walker) Transfers: Sit to/from Stand(Pt only performed stand to sit as she was standing with RN on arrival.  ) Sit to Stand: Mod assist General transfer comment: Cues for hand placement on her lap, and cues to address and maintain RUE so it does not wander during transfer.      Ambulation / Gait / Stairs / Wheelchair Mobility  Ambulation/Gait Ambulation/Gait assistance: Mod assist Ambulation Distance (Feet):  100 Feet(x2 trials ) Assistive device: 4-wheeled walker(EVA walker) Gait Pattern/deviations: Step-through pattern, Decreased stride length, Decreased dorsiflexion - right, Decreased stance time - right, Trunk flexed, Narrow base of support, Scissoring, Ataxic General Gait Details: Pt required cues for R foot clearance and R weight shifting in R stance phase.  Pt at times begins to drag R LE when fatigue sets in.  Pt with supinated R foot and ataxic scissoring.  VCs are not enough to brake down patient gait deviations.   Gait velocity interpretation: Below normal speed for age/gender    Posture / Balance Balance Overall balance assessment: Needs assistance Sitting balance-Leahy Scale: Fair Standing balance-Leahy Scale: Poor Standing balance comment: reliant on UE support.    Special needs/care consideration BiPAP/CPAP  N/a CPM  N/a Continuous Drip IV  N/a Dialysis  N/a Life Vest  N/a Oxygen 2 liters nasal cannula Special Bed  N/a  Trach Size  N/a Wound Vac   N/a Skin surgical incisions; ecchymosis to BLE; rash to buttocks Bowel mgmt: continent LBM 3/4 Bladder mgmt: continent Diabetic mgmt  N/a   Previous Home Environment Living Arrangements: Spouse/significant other  Lives With: Spouse Available Help at Discharge: Family, Available 24 hours/day Type of Home: House Home Layout: One level Home Access: Stairs to enter Entrance Stairs-Rails: None Entrance Stairs-Number of Steps: 3 Bathroom Shower/Tub: Public librarian, Multimedia programmer: Associate Professor Accessibility: Yes Home Care Services: (was recieivng oupt therapy. Had [place] initially) Additional Comments: Husband and other family available 24/7  Discharge Living Setting Plans for Discharge Living Setting: Patient's home, Lives with (comment)(spouse) Type of Home at Discharge: House Discharge Home Layout: One level Discharge Home Access: Stairs to enter Entrance Stairs-Rails: None Entrance  Stairs-Number of Steps: 3 Discharge Bathroom Shower/Tub: Tub/shower unit, Walk-in shower Discharge Bathroom Toilet: Standard Discharge Bathroom Accessibility: Yes How Accessible: Accessible via walker Does the patient have any problems obtaining your medications?: (new with insurance 09/2017)  Social/Family/Support Systems Patient Roles: Spouse, Parent Contact Information: [name], spouse Anticipated Caregiver: spouse Anticipated Caregiver's Contact Information: see above Ability/Limitations of Caregiver: [name] is retired; no limitations Careers adviser: 24/7 Discharge Plan Discussed with Primary Caregiver: Yes Is Caregiver In Agreement with Plan?: Yes Does Caregiver/Family have Issues with Lodging/Transportation while Pt is in Rehab?: No  Goals/Additional Needs Patient/Family Goal for Rehab: superivison toi min assist with PT, OT, and SLP Expected length of stay: ELOS 13 to 17 days Pt/Family Agrees to Admission and willing to participate: Yes Program Orientation Provided & Reviewed with Pt/Caregiver Including Roles  & Responsibilities: Yes  Decrease burden of Care through IP rehab admission: n/a  Possible need for SNF placement upon discharge:not anticipated  Patient Condition: This patient's condition remains as documented in the consult dated 11/28/2017, in which the Rehabilitation Physician determined and documented that the patient's condition is appropriate for intensive rehabilitative care in an inpatient rehabilitation facility. Will admit to inpatient rehab today.  Preadmission Screen Completed By:  Cleatrice Burke, 11/29/2017 11:05 AM ______________________________________________________________________   Discussed status with Dr. Posey Pronto on 11/29/2017 at  1113 and received telephone approval for admission today.  Admission Coordinator:  Cleatrice Burke, time 2751 Date 11/29/2017

## 2017-11-30 ENCOUNTER — Telehealth (HOSPITAL_COMMUNITY): Payer: Self-pay

## 2017-11-30 ENCOUNTER — Inpatient Hospital Stay (HOSPITAL_COMMUNITY): Payer: BLUE CROSS/BLUE SHIELD

## 2017-11-30 ENCOUNTER — Inpatient Hospital Stay (HOSPITAL_COMMUNITY): Payer: BLUE CROSS/BLUE SHIELD | Admitting: Speech Pathology

## 2017-11-30 ENCOUNTER — Inpatient Hospital Stay (HOSPITAL_COMMUNITY): Payer: BLUE CROSS/BLUE SHIELD | Admitting: Occupational Therapy

## 2017-11-30 DIAGNOSIS — R0602 Shortness of breath: Secondary | ICD-10-CM

## 2017-11-30 DIAGNOSIS — D62 Acute posthemorrhagic anemia: Secondary | ICD-10-CM

## 2017-11-30 DIAGNOSIS — D72829 Elevated white blood cell count, unspecified: Secondary | ICD-10-CM

## 2017-11-30 DIAGNOSIS — I6930 Unspecified sequelae of cerebral infarction: Secondary | ICD-10-CM

## 2017-11-30 DIAGNOSIS — R5381 Other malaise: Principal | ICD-10-CM

## 2017-11-30 DIAGNOSIS — E871 Hypo-osmolality and hyponatremia: Secondary | ICD-10-CM

## 2017-11-30 DIAGNOSIS — I10 Essential (primary) hypertension: Secondary | ICD-10-CM

## 2017-11-30 LAB — CBC WITH DIFFERENTIAL/PLATELET
BASOS ABS: 0 10*3/uL (ref 0.0–0.1)
BASOS PCT: 0 %
Eosinophils Absolute: 0.5 10*3/uL (ref 0.0–0.7)
Eosinophils Relative: 5 %
HCT: 30 % — ABNORMAL LOW (ref 36.0–46.0)
HEMOGLOBIN: 9.5 g/dL — AB (ref 12.0–15.0)
LYMPHS PCT: 24 %
Lymphs Abs: 2.6 10*3/uL (ref 0.7–4.0)
MCH: 30.2 pg (ref 26.0–34.0)
MCHC: 31.7 g/dL (ref 30.0–36.0)
MCV: 95.2 fL (ref 78.0–100.0)
MONO ABS: 0.9 10*3/uL (ref 0.1–1.0)
MONOS PCT: 8 %
NEUTROS PCT: 63 %
Neutro Abs: 6.9 10*3/uL (ref 1.7–7.7)
Platelets: 444 10*3/uL — ABNORMAL HIGH (ref 150–400)
RBC: 3.15 MIL/uL — ABNORMAL LOW (ref 3.87–5.11)
RDW: 15.2 % (ref 11.5–15.5)
WBC: 10.8 10*3/uL — ABNORMAL HIGH (ref 4.0–10.5)

## 2017-11-30 LAB — COMPREHENSIVE METABOLIC PANEL
ALBUMIN: 3.3 g/dL — AB (ref 3.5–5.0)
ALT: 10 U/L — ABNORMAL LOW (ref 14–54)
AST: 20 U/L (ref 15–41)
Alkaline Phosphatase: 54 U/L (ref 38–126)
Anion gap: 12 (ref 5–15)
BILIRUBIN TOTAL: 0.5 mg/dL (ref 0.3–1.2)
BUN: 15 mg/dL (ref 6–20)
CALCIUM: 9 mg/dL (ref 8.9–10.3)
CO2: 33 mmol/L — ABNORMAL HIGH (ref 22–32)
CREATININE: 0.81 mg/dL (ref 0.44–1.00)
Chloride: 88 mmol/L — ABNORMAL LOW (ref 101–111)
GFR calc Af Amer: >60 mL/min (ref >60)
GLUCOSE: 102 mg/dL — AB (ref 65–99)
POTASSIUM: 3.8 mmol/L (ref 3.5–5.1)
Sodium: 133 mmol/L — ABNORMAL LOW (ref 135–145)
TOTAL PROTEIN: 6.5 g/dL (ref 6.5–8.1)

## 2017-11-30 NOTE — Evaluation (Signed)
Physical Therapy Assessment and Plan  Patient Details  Name: [name] MRN: [id number] Date of Birth: [birth year]  PT Diagnosis: Abnormality of gait and Muscle weakness Rehab Potential: Good ELOS: 7-10 days   Today's Date: 11/30/2017 PT Individual Time: 1045-1200 PT Individual Time Calculation (min): 75 min    Problem List:  Patient Active Problem List   Diagnosis Date Noted  . Shortness of breath   . Essential hypertension   . Leukocytosis   . Hyponatremia   . Debility 11/29/2017  . Benign essential HTN   . Anxiety state   . Muscle spasm   . Community acquired pneumonia of right lower lobe of lung ([place])   . Hx of CABG   . Acute blood loss anemia   . Tachycardia   . Post-operative pain   . Diastolic dysfunction   . Tobacco abuse   . History of CVA with residual deficit   . Hyperglycemia   . Supplemental oxygen dependent   . 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 Coronary artery disease 11/23/2017  . Acute on chronic systolic heart failure ([place]) 11/21/2017  . Acute respiratory failure with hypoxia ([place]) 11/16/2017  . Aspiration pneumonia ([place]) 11/16/2017  . Elevated troponin 11/16/2017  . NSTEMI (non-ST elevated myocardial infarction) ([place]) 11/16/2017  . Constipation 11/10/2017  . Stress and adjustment reaction 10/13/2017  . Urticaria 10/13/2017  . Former smoker 10/13/2017  . Hypokalemia   . Cerebrovascular accident (CVA) due to occlusion of left middle cerebral artery ([place])   . Right hemiplegia ([place])   . Dysphagia, post-stroke   . Hyperlipidemia     Past Medical History:  Past Medical History:  Diagnosis Date  . Arthritis    "knees" (11/16/2017)  . Hay fever   . Pneumonia 11/16/2017  . Stroke [facility]) 08/23/2017   Left MCA infarct status post TPA and thrombectomy   Past Surgical History:  Past Surgical History:  Procedure Laterality Date  . CORONARY ARTERY BYPASS GRAFT N/A 11/23/2017   Procedure: CORONARY ARTERY BYPASS GRAFTING (CABG) x 2, ON PUMP, USING LEFT  INTERNAL MAMMARY ARTERY AND RIGHT GREATER SAPHENOUS VEIN HARVESTED ENDOSCOPICALLY;  Surgeon: Gaye Pollack, MD;  Location: [facility];  Service: Open Heart Surgery;  Laterality: N/A;  . CRYOABLATION     cervical  . IR ANGIO INTRA EXTRACRAN SEL COM CAROTID INNOMINATE UNI R MOD SED  08/21/2017  . IR ANGIO VERTEBRAL SEL SUBCLAVIAN INNOMINATE UNI R MOD SED  08/21/2017  . IR PERCUTANEOUS ART THROMBECTOMY/INFUSION INTRACRANIAL INC DIAG ANGIO  08/21/2017  . IR RADIOLOGIST EVAL & MGMT  10/30/2017  . LOOP RECORDER INSERTION N/A 08/28/2017   Procedure: LOOP RECORDER INSERTION;  Surgeon: Constance Haw, MD;  Location: [place] CV [facility];  Service: Cardiovascular;  Laterality: N/A;  . RADIOLOGY WITH ANESTHESIA N/A 08/21/2017   Procedure: RADIOLOGY WITH ANESTHESIA;  Surgeon: Luanne Bras, MD;  Location: [place];  Service: Radiology;  Laterality: N/A;  . RIGHT/LEFT HEART CATH AND CORONARY ANGIOGRAPHY N/A 11/20/2017   Procedure: RIGHT/LEFT HEART CATH AND CORONARY ANGIOGRAPHY;  Surgeon: Jettie Booze, MD;  Location: [place] [facility];  Service: Cardiovascular;  Laterality: N/A;  . TEE WITHOUT CARDIOVERSION N/A 08/28/2017   Procedure: TRANSESOPHAGEAL ECHOCARDIOGRAM (TEE);  Surgeon: Fay Records, MD;  Location: [place];  Service: Cardiovascular;  Laterality: N/A;  . TEE WITHOUT CARDIOVERSION N/A 11/23/2017   Procedure: TRANSESOPHAGEAL ECHOCARDIOGRAM (TEE);  Surgeon: Gaye Pollack, MD;  Location: [place];  Service: Open Heart Surgery;  Laterality: N/A;  .  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 Per chart reviewand patient, patientlives with spouse. One level home with 3 steps to entry.  Ambulated short distances in the home and community with a quad cane. Patient did have a right foot drop. Modified independent with standing transfers into and out of wheelchair. Modified independence with ADLs. Presented 11/16/2017 with increasing shortness of breath. Chest x-ray suspicious for aspiration pneumonia. Troponin 0.4. Echocardiogram showed ST depressions in the anterior lateral leads. Echocardiogram with left ventricular systolic dysfunction with ejection fraction of 40%. Diffuse hypokinesis. Grade 2 diastolic dysfunction. Cardiac catheterization showed 75% mid to distal left main stenosis with significant calcification. There was 50% proximal left circumflex stenosis. Underwent CABG 2. 11/24/2017 per Dr. Cyndia Bent. Hospital course pain management.  Patient transferred to CIR on 11/29/2017 .   Patient currently requires min with mobility secondary to muscle weakness, decreased cardiorespiratoy endurance and abnormal tone.  Prior to hospitalization, patient was supervision with mobility and lived with Spouse in a House home.  Home access is 2Stairs to enter.  Patient will benefit from skilled PT intervention to maximize safe functional mobility and minimize fall risk for planned discharge home with 24 hour supervision.  Anticipate patient will benefit from follow up [place] at discharge.  PT - End of Session Activity Tolerance: Tolerates 30+ min activity with multiple rests Endurance Deficit: Yes Endurance Deficit Description: requires oxygen for mobility due to SpO2 drops lower than 90% with mobility PT Assessment Rehab Potential (ACUTE/IP ONLY): Good PT Patient demonstrates impairments in the following area(s): Balance;Endurance;Motor;Pain;Safety PT Transfers Functional Problem(s): Bed Mobility;Bed to Chair;Car;Furniture PT Locomotion Functional Problem(s): Ambulation;Wheelchair Mobility;Stairs PT Plan PT Intensity: Minimum of 1-2 x/day ,45 to 90 minutes PT Frequency: 5 out of 7 days PT  Duration Estimated Length of Stay: 7-10 days PT Treatment/Interventions: Ambulation/gait training;Functional mobility training;Therapeutic Activities;Wheelchair propulsion/positioning;Therapeutic Exercise;Balance/vestibular training;DME/adaptive equipment instruction;Pain management;Stair training;Patient/family education;Functional electrical stimulation PT Transfers Anticipated Outcome(s): S PT Locomotion Anticipated Outcome(s): S PT Recommendation Follow Up Recommendations: Home health PT;24 hour supervision/assistance Patient destination: Home Equipment Recommended: None recommended by PT  Skilled Therapeutic Intervention Patient in supine assist to EOB S cues for sternal precautions.  C/o lethargy and easily falls asleep during session, but easily aroused.  Patient transferred to w/c min A stand pivot with cues for precautions. Propelled w/c with mod cues and S LE's only due to sternal precautions.  Patient in therapy gym for transfer to mat and sit<>supine and rolling practice maintaining sternal precautions.  Patient ambulated with min A and SBQC x 35' min A increased time and general imbalance.  Vital signs stable as noted below.  Patient negotiated 4 steps with one rail min a and cues for precautions, technique.  In w/c propelled to ortho gym S after replaced w/c for better fit and cushion.  Patient performed car transfer min A stand pivot and able to get legs in/out with S.  Patient propelled to her room with S and left with needs in reach and chair alarm active .   PT Evaluation Precautions/Restrictions Precautions Precautions: Fall;Sternal;Other (comment) Precaution Comments: Verbally reviewed sternal precautions; residual R-side weakness Restrictions Weight Bearing Restrictions: No Other Position/Activity Restrictions: sternal precautions.   General   Vital SignsTherapy Vitals Pulse Rate: 90 BP: 95/69  Patient Position (if appropriate): Sitting Oxygen Therapy SpO2: 100 % O2  Device: Nasal Cannula O2 Flow Rate (L/min): 2 L/min Pulse Oximetry Type: Intermittent Pain Pain Assessment Pain Assessment: No/denies pain Pain Score: 5  Pain Location: Chest Pain Descriptors / Indicators: Sore;Aching Pain Onset: On-going Pain Intervention(s): Ambulation/increased activity;Repositioned Home Living/Prior Functioning Home Living Living Arrangements: Spouse/significant other;Other relatives Available Help at Discharge: Family;Available 24 hours/day Type of Home: House Home Access: Stairs to enter [employer] of Steps: 2 Entrance Stairs-Rails: None(post, uses other items to hold onto) Home Layout: One level Bathroom Shower/Tub: Tub/shower unit Additional Comments: Husband and other family available 24/7  Lives With: Spouse Prior Function Level of Independence: Needs assistance with ADLs(Supervision from spouse)  Able to Take Stairs?: Yes Vocation: Unemployed Comments: Has R resting hand splint  Vision/Perception  Perception Perception: Within Functional Limits(slight mild R inattention noted during UB dressing) Praxis Praxis: Intact  Cognition Overall Cognitive Status: History of cognitive impairments - at baseline Arousal/Alertness: Awake/alert Orientation Level: Oriented X4 Attention: Selective Selective Attention: Impaired Selective Attention Impairment: Functional complex Memory: Impaired Memory Impairment: Retrieval deficit Awareness: Appears intact Problem Solving: Appears intact Executive Function: Self Monitoring;Self Correcting Self Monitoring: Impaired Self Monitoring Impairment: Functional complex;Verbal complex Self Correcting: Impaired Self Correcting Impairment: Functional complex;Verbal complex Behaviors: Perseveration Safety/Judgment: Impaired Sensation Sensation Light Touch: Appears Intact Stereognosis: Impaired by gross assessment Hot/Cold: Not tested Additional Comments: impaired in RUE Coordination Gross Motor  Movements are Fluid and Coordinated: No Fine Motor Movements are Fluid and Coordinated: No Coordination and Movement Description: R hemiparesis from CVA in December 2018 Motor  Motor Motor: Hemiplegia Motor - Skilled Clinical Observations: generalized weakness   Mobility Bed Mobility Bed Mobility: Rolling Right;Rolling Left;Supine to Sit;Sit to Supine Rolling Right: 5: Supervision Rolling Left: 5: Supervision Supine to Sit: 5: Supervision Sit to Supine: 5: Supervision Transfers Transfers: Yes Sit to Stand: 4: Min assist Stand to Sit: 4: Min assist Stand Pivot Transfers: 4: Min Psychologist, occupational Details (indicate cue type and reason): assist for safety cues for moving R foot back to sit Locomotion  Ambulation Ambulation: Yes Ambulation/Gait Assistance: 4: Min assist Ambulation Distance (Feet): 35 Feet Assistive device: Small based quad cane Ambulation/Gait Assistance Details: assist for balance, cues for R foot clearance Stairs / Additional Locomotion Stairs: Yes Stairs Assistance: 4: Min assist Stairs Assistance Details (indicate cue type and reason): cues for L UE use to follow sternal precautions Stair Management Technique: One rail Left Number of Stairs: 4 Height of Stairs: 6  Trunk/Postural Assessment  Cervical Assessment Cervical Assessment: Within Functional Limits Thoracic Assessment Thoracic Assessment: Within Functional Limits Lumbar Assessment Lumbar Assessment: Within Functional Limits Postural Control Postural Control: Deficits on evaluation(R lateral lean)  Balance Static Sitting Balance Static Sitting - Level of Assistance: 7: Independent Dynamic Sitting Balance Dynamic Sitting - Level of Assistance: 5: Stand by assistance Static Standing Balance Static Standing - Level of Assistance: 5: Stand by assistance Dynamic Standing Balance Dynamic Standing - Level of Assistance: 4: Min assist Extremity Assessment  RUE Assessment RUE Assessment:  Exceptions to WFL(hemiplegia from CVA in December, trace movement in bicep and 1/5 in shoulder flex) RUE Tone Hypertonic Details: increased tone in finger flexors, pt does have full PROM of hand Hypotonic Details: slight subluxation LUE Assessment LUE Assessment: Within Functional Limits RLE Assessment RLE Assessment: Exceptions to [facility] RLE Strength RLE Overall Strength Comments: AROM WFL, strength grossly 3+/5 throughout except ankle DF 2+/5 LLE Assessment LLE Assessment: Within Functional Limits   See Function Navigator  for Current Functional Status.   Refer to Care Plan for Long Term Goals  Recommendations for other services: None   Discharge Criteria: Patient will be discharged from PT if patient refuses treatment 3 consecutive times without medical reason, if treatment goals not met, if there is a change in medical status, if patient makes no progress towards goals or if patient is discharged from hospital.  The above assessment, treatment plan, treatment alternatives and goals were discussed and mutually agreed upon: by patient  Reginia Naas 11/30/2017, 1:37 PM

## 2017-11-30 NOTE — Progress Notes (Signed)
[facility] Summary Report / Loop Recorder 

## 2017-11-30 NOTE — Progress Notes (Signed)
[name] PHYSICAL MEDICINE & REHABILITATION     PROGRESS NOTE  Subjective/Complaints:  Patient seen sitting up in bed this morning. She states she did not sleep well overnight because of shortness of breath. She still complains of shortness of breath and drowsiness.  ROS: + SOB. Denies CP, nausea, vomiting, diarrhea.  Objective: Vital Signs: Blood pressure 128/68, pulse 82, temperature 97.9 F (36.6 C), temperature source Oral, resp. rate 18, height 5\' 1"  (1.549 m), weight 57.9 kg (127 lb 10.3 oz), SpO2 96 %. No results found. Recent Labs    11/30/17 0642  WBC 10.8*  HGB 9.5*  HCT 30.0*  PLT 444*   Recent Labs    11/30/17 0642  NA 133*  K 3.8  CL 88*  GLUCOSE 102*  BUN 15  CREATININE 0.81  CALCIUM 9.0   CBG (last 3)  Recent Labs    11/27/17 1214 11/27/17 2239  GLUCAP 121* 117*    Wt Readings from Last 3 Encounters:  11/30/17 57.9 kg (127 lb 10.3 oz)  11/29/17 60.5 kg (133 lb 6.1 oz)  11/10/17 58.5 kg (129 lb)    Physical Exam:  BP 128/68   Pulse 82   Temp 97.9 F (36.6 C) (Oral)   Resp 18   Ht 5\' 1"  (1.549 m)   Wt 57.9 kg (127 lb 10.3 oz)   LMP  (LMP Unknown)   SpO2 96%   BMI 24.12 kg/m  Constitutional: She appears well-developed and well-nourished.  HENT: Normocephalic and atraumatic.  Eyes: EOM are normal. No discharge.  Cardiovascular: Normal rate and regular rhythm. No JVD. Respiratory: Increased WOB. Upper airway sounds. + [place] GI: Bowel sounds are normal. Nondistended.  Musculoskeletal: No edema or tenderness in extremities  Neurological:  Lethargic.  Follows commands. Motor: RUE: 2-/5 proximal to distal with apraxia RLE: 4-/5 HF, KE 3+/5 ADF   Skin: Skin is warm and dry.  Psychiatric: Tangential at times    Assessment/Plan: 1. Functional deficits secondary to debility which require 3+ hours per day of interdisciplinary therapy in a comprehensive inpatient rehab setting. Physiatrist is providing close team supervision and 24 hour  management of active medical problems listed below. Physiatrist and rehab team continue to assess barriers to discharge/monitor patient progress toward functional and medical goals.  Function:  Bathing Bathing position      Bathing parts      Bathing assist        Upper Body Dressing/Undressing Upper body dressing                    Upper body assist        Lower Body Dressing/Undressing Lower body dressing                                  Lower body assist        Toileting Toileting   Toileting steps completed by patient: Adjust clothing prior to toileting, Performs perineal hygiene, Adjust clothing after toileting      Toileting assist     Transfers Chair/bed transfer             Locomotion Ambulation           Wheelchair          Cognition Comprehension Comprehension assist level: Follows basic conversation/direction with no assist  Expression Expression assist level: Expresses basic needs/ideas: With no assist  Social Interaction Social Interaction assist level: Interacts appropriately  with others - No medications needed.  Problem Solving Problem solving assist level: Solves basic problems with no assist  Memory Memory assist level: Complete Independence: No helper    Medical Problem List and Plan: 1.  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  DVT Prophylaxis/Anticoagulation: Subcutaneous Lovenox 3. Pain Management: Baclofen 5 mg TID,Oxycodone/Ultram as needed 4. Mood:Lexapro 10 mg daily  5. Neuropsych: This patient is capable of making decisions on her own behalf. 6. Skin/Wound Care: Routine skin checks 7. Fluids/Electrolytes/Nutrition: Routine I&Os 8.Hypertension.Coreg 3.125 mg BID,Lasix 40 mg daily   Monitor with increased mobility 9. History of tobacco abuse. Provide counseling 10.  Hyperlipidemia. Crestor 11. Constipation. Laxative assistance 12. SOB   Last CXR on 3/3 reviewed showing atelectasis. CXR ordered    CO2 elevated on 3/7   ECG ordered   Pulse ox ordered   Wean supplemental oxygen as tolerated   13. Leukocytosis   WBCs 10.8 on 3/7   Afebrile   Labs ordered for tomorrow 14. Acute blood loss anemia   Hemoglobin 9.5 on 3/7   Continue to monitor 15. Hyponatremia   Sodium 133 on 3/7   Continue to monitor  LOS (Days) 1 A FACE TO FACE EVALUATION WAS PERFORMED  Ankit Lorie Phenix 11/30/2017 8:38 AM

## 2017-11-30 NOTE — Evaluation (Signed)
Occupational Therapy Assessment and Plan  Patient Details  Name: [name] MRN: [id number] Date of Birth: 1960/12/22  OT Diagnosis: cognitive deficits, hemiplegia affecting dominant side and muscle weakness (generalized) Rehab Potential: Rehab Potential (ACUTE ONLY): Good ELOS: 7-10 days   Today's Date: 11/30/2017 OT Individual Time: 1219-7588 OT Individual Time Calculation (min): 60 min     Problem List:  Patient Active Problem List   Diagnosis Date Noted  . Shortness of breath   . Essential hypertension   . Leukocytosis   . Hyponatremia   . Debility 11/29/2017  . Benign essential HTN   . Anxiety state   . Muscle spasm   . Community acquired pneumonia of right lower lobe of lung ([place])   . Hx of CABG   . Acute blood loss anemia   . Tachycardia   . Post-operative pain   . Diastolic dysfunction   . Tobacco abuse   . History of CVA with residual deficit   . Hyperglycemia   . Supplemental oxygen dependent   . 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 Coronary artery disease 11/23/2017  . Acute on chronic systolic heart failure ([place]) 11/21/2017  . Acute respiratory failure with hypoxia ([place]) 11/16/2017  . Aspiration pneumonia ([place]) 11/16/2017  . Elevated troponin 11/16/2017  . NSTEMI (non-ST elevated myocardial infarction) ([place]) 11/16/2017  . Constipation 11/10/2017  . Stress and adjustment reaction 10/13/2017  . Urticaria 10/13/2017  . Former smoker 10/13/2017  . Hypokalemia   . Cerebrovascular accident (CVA) due to occlusion of left middle cerebral artery ([place])   . Right hemiplegia ([place])   . Dysphagia, post-stroke   . Hyperlipidemia     Past Medical History:  Past Medical History:  Diagnosis Date  . Arthritis    "knees" (11/16/2017)  . Hay fever   . Pneumonia 11/16/2017  . Stroke [facility]) 08/23/2017   Left MCA infarct status post TPA and thrombectomy   Past Surgical History:  Past Surgical History:  Procedure Laterality Date  . CORONARY ARTERY BYPASS GRAFT N/A  11/23/2017   Procedure: CORONARY ARTERY BYPASS GRAFTING (CABG) x 2, ON PUMP, USING LEFT INTERNAL MAMMARY ARTERY AND RIGHT GREATER SAPHENOUS VEIN HARVESTED ENDOSCOPICALLY;  Surgeon: Gaye Pollack, MD;  Location: [place];  Service: Open Heart Surgery;  Laterality: N/A;  . CRYOABLATION     cervical  . IR ANGIO INTRA EXTRACRAN SEL COM CAROTID INNOMINATE UNI R MOD SED  08/21/2017  . IR ANGIO VERTEBRAL SEL SUBCLAVIAN INNOMINATE UNI R MOD SED  08/21/2017  . IR PERCUTANEOUS ART THROMBECTOMY/INFUSION INTRACRANIAL INC DIAG ANGIO  08/21/2017  . IR RADIOLOGIST EVAL & MGMT  10/30/2017  . LOOP RECORDER INSERTION N/A 08/28/2017   Procedure: LOOP RECORDER INSERTION;  Surgeon: Constance Haw, MD;  Location: [place] CV LAB;  Service: Cardiovascular;  Laterality: N/A;  . RADIOLOGY WITH ANESTHESIA N/A 08/21/2017   Procedure: RADIOLOGY WITH ANESTHESIA;  Surgeon: Luanne Bras, MD;  Location: [place];  Service: Radiology;  Laterality: N/A;  . RIGHT/LEFT HEART CATH AND CORONARY ANGIOGRAPHY N/A 11/20/2017   Procedure: RIGHT/LEFT HEART CATH AND CORONARY ANGIOGRAPHY;  Surgeon: Jettie Booze, MD;  Location: [place] CV LAB;  Service: Cardiovascular;  Laterality: N/A;  . TEE WITHOUT CARDIOVERSION N/A 08/28/2017   Procedure: TRANSESOPHAGEAL ECHOCARDIOGRAM (TEE);  Surgeon: Fay Records, MD;  Location: [place];  Service: Cardiovascular;  Laterality: N/A;  . TEE WITHOUT CARDIOVERSION N/A 11/23/2017   Procedure: TRANSESOPHAGEAL ECHOCARDIOGRAM (TEE);  Surgeon: Gaye Pollack, MD;  Location: [place];  Service: Open Heart Surgery;  Laterality: N/A;  . TUBAL LIGATION      Assessment & Plan Clinical Impression:  [name] is a 57 y.o. 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 Per chart  review and patient, patient lives with spouse. One level home with 3 steps to entry. Ambulated short distances in the home and community with a quad cane. Patient did have a right foot drop. Modified independent with standing transfers into and out of wheelchair. Modified independence with ADLs. Presented 11/16/2017 with increasing shortness of breath. Chest x-ray suspicious for aspiration pneumonia. Troponin 0.4. Echocardiogram showed ST depressions in the anterior lateral leads. Echocardiogram with left ventricular systolic dysfunction with ejection fraction of 40%. Diffuse hypokinesis. Grade 2 diastolic dysfunction. Cardiac catheterization showed 75% mid to distal left main stenosis with significant calcification. There was 50% proximal left circumflex stenosis. Underwent CABG 2. 11/24/2017 per Dr. Cyndia Bent. Hospital course pain management. Sternal precautions as directed. Acute blood loss anemia monitored. Subcutaneous Lovenox for DVT prophylaxis. Tolerating a regular diet. Physical and occupational therapy evaluations completed with recommendations of physical medicine rehabilitation consult.Patient was admitted for a comprehensive rehabilitation program   Patient transferred to CIR on 11/29/2017 .    Patient currently requires min with basic self-care skills secondary to muscle weakness, decreased cardiorespiratoy endurance and decreased oxygen support, abnormal tone, decreased problem solving and decreased memory and decreased sitting balance, decreased standing balance, decreased postural control, hemiplegia and decreased balance strategies.  Prior to hospitalization, patient could complete self care with supervision.  Patient will benefit from skilled intervention to increase independence with basic self-care skills prior to discharge home with care partner.  Anticipate patient will require intermittent supervision and follow up home health.  OT - End of Session Activity Tolerance: Tolerates 10 - 20  min activity with multiple rests Endurance Deficit: Yes Endurance Deficit Description: needs 1.5 L of O2, on room air O2 sats drop below 90 OT Assessment Rehab Potential (ACUTE ONLY): Good OT Patient demonstrates impairments in the following area(s): Balance;Endurance;Motor;Safety OT Basic ADL's Functional Problem(s): Bathing;Dressing;Toileting OT Transfers Functional Problem(s): Toilet;Tub/Shower OT Additional Impairment(s): Fuctional Use of Upper Extremity OT Plan OT Intensity: Minimum of 1-2 x/day, 45 to 90 minutes OT Frequency: 5 out of 7 days OT Duration/Estimated Length of Stay: 7-10 days OT Treatment/Interventions: Balance/vestibular training;Cognitive remediation/compensation;Discharge planning;DME/adaptive equipment instruction;Functional mobility training;Neuromuscular re-education;Self Care/advanced ADL retraining;Patient/family education;Therapeutic Exercise;Therapeutic Activities;UE/LE Strength taining/ROM;UE/LE Coordination activities OT Self Feeding Anticipated Outcome(s): no goal OT Basic Self-Care Anticipated Outcome(s): supervision focusing on transfers OT Toileting Anticipated Outcome(s): supervision OT Bathroom Transfers Anticipated Outcome(s): supervision OT Recommendation Patient destination: Home Follow Up Recommendations: Home health OT Equipment Recommended: None recommended by OT   Skilled Therapeutic Intervention Pt seen for initial evaluation and ADL training.  Attempted to have pt on room air for a few minutes but her O2 sats dropped to 87%.  Pt has adapted well to her limitations from her CVA in December.  She continues to have deficits that she would benefit from further NMR while she is receiving rehab for her admitting diagnosis of deconditioning from her CABG.  Pt participated in self care training with steadying A from EOB and standing at sink with steadying A.  Reviewed OT POC and goals.    OT Evaluation Precautions/Restrictions   Precautions Precautions: Fall;Sternal;Other (comment) Precaution Comments: Verbally  reviewed sternal precautions; residual R-side weakness Restrictions Weight Bearing Restrictions: No Other Position/Activity Restrictions: sternal precautions.     Vital Signs Therapy Vitals Pulse Rate: 90 BP: 95/69 Patient Position (if appropriate): Sitting Oxygen Therapy SpO2: 100 % O2 Device: Nasal Cannula O2 Flow Rate (L/min): 2 L/min Pulse Oximetry Type: Intermittent Pain Pain Assessment Pain Assessment: No/denies pain Pain Score: 5  Pain Location: Chest Pain Descriptors / Indicators: Sore;Aching Pain Onset: On-going Pain Intervention(s): Ambulation/increased activity;Repositioned Home Living/Prior Functioning Home Living Available Help at Discharge: Family, Available 24 hours/day Type of Home: House Home Access: Stairs to enter Technical brewer of Steps: 2 Entrance Stairs-Rails: None(post, uses other items to hold onto) Home Layout: One level Bathroom Shower/Tub: Tub/shower unit Additional Comments: Husband and other family available 24/7  Lives With: Spouse Prior Function Level of Independence: Needs assistance with ADLs(Supervision from spouse)  Able to Take Stairs?: Yes Vocation: Unemployed Comments: Has R resting hand splint  ADL ADL ADL Comments: Refer to functional navigator Vision Baseline Vision/History: Wears glasses Wears Glasses: Reading only Patient Visual Report: No change from baseline Vision Assessment?: No apparent visual deficits Perception  Perception: Within Functional Limits(slight mild R inattention noted during UB dressing) Praxis Praxis: Intact Cognition Overall Cognitive Status: History of cognitive impairments - at baseline Arousal/Alertness: Awake/alert Orientation Level: Person;Place;Situation Person: Oriented Place: Oriented Situation: Oriented Year: 2019 Month: March Day of Week: Correct Memory: Impaired Memory Impairment:  Retrieval deficit Immediate Memory Recall: Sock;Blue;Bed Memory Recall: Sock;Blue(unable to recall "bed") Memory Recall Sock: With Cue Memory Recall Blue: With Cue Attention: Selective Selective Attention: Impaired Selective Attention Impairment: Functional complex Awareness: Appears intact Problem Solving: Appears intact Executive Function: Self Monitoring;Self Correcting Self Monitoring: Impaired Self Monitoring Impairment: Functional complex;Verbal complex Self Correcting: Impaired Self Correcting Impairment: Functional complex;Verbal complex Behaviors: Perseveration Safety/Judgment: Impaired Sensation Sensation Light Touch: Appears Intact Stereognosis: Impaired by gross assessment Hot/Cold: Not tested Additional Comments: impaired in RUE Coordination Gross Motor Movements are Fluid and Coordinated: No Fine Motor Movements are Fluid and Coordinated: No Coordination and Movement Description: R hemiparesis from CVA in December 2018 Motor  Motor Motor: Hemiplegia Motor - Skilled Clinical Observations: generalized weakness  Mobility  Bed Mobility Bed Mobility: Rolling Right;Rolling Left;Supine to Sit;Sit to Supine Rolling Right: 5: Supervision Rolling Left: 5: Supervision Supine to Sit: 5: Supervision Sit to Supine: 5: Supervision Transfers Sit to Stand: 4: Min assist Stand to Sit: 4: Min assist  Trunk/Postural Assessment  Cervical Assessment Cervical Assessment: Within Functional Limits Thoracic Assessment Thoracic Assessment: Within Functional Limits Lumbar Assessment Lumbar Assessment: Within Functional Limits Postural Control Postural Control: Deficits on evaluation(R lateral lean)  Balance Static Sitting Balance Static Sitting - Level of Assistance: 7: Independent Dynamic Sitting Balance Dynamic Sitting - Level of Assistance: 5: Stand by assistance Static Standing Balance Static Standing - Level of Assistance: 5: Stand by assistance Dynamic Standing  Balance Dynamic Standing - Level of Assistance: 4: Min assist Extremity/Trunk Assessment RUE Assessment RUE Assessment: Exceptions to WFL(hemiplegia from CVA in December, trace movement in bicep and 1/5 in shoulder flex) RUE Tone Hypertonic Details: increased tone in finger flexors, pt does have full PROM of hand Hypotonic Details: slight subluxation LUE Assessment LUE Assessment: Within Functional Limits   See Function Navigator for Current Functional Status.   Refer to Care Plan for Long Term Goals  Recommendations for other services: None    Discharge Criteria: Patient will be discharged from OT if patient refuses treatment 3 consecutive times without medical reason, if treatment goals not met, if there  is a change in medical status, if patient makes no progress towards goals or if patient is discharged from hospital.  The above assessment, treatment plan, treatment alternatives and goals were discussed and mutually agreed upon: by patient  [place] 11/30/2017, 1:18 PM

## 2017-11-30 NOTE — Progress Notes (Signed)
Social Work  Social Work Assessment and Plan  Patient Details  Name: [name] MRN: [id number] Date of Birth: Oct 29, 1960  Today's Date: 11/30/2017  Problem List:  Patient Active Problem List   Diagnosis Date Noted  . Shortness of breath   . Essential hypertension   . Leukocytosis   . Hyponatremia   . Debility 11/29/2017  . Benign essential HTN   . Anxiety state   . Muscle spasm   . Community acquired pneumonia of right lower lobe of lung ([place])   . Hx of CABG   . Acute blood loss anemia   . Tachycardia   . Post-operative pain   . Diastolic dysfunction   . Tobacco abuse   . History of CVA with residual deficit   . Hyperglycemia   . Supplemental oxygen dependent   . 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 Coronary artery disease 11/23/2017  . Acute on chronic systolic heart failure ([place]) 11/21/2017  . Acute respiratory failure with hypoxia ([place]) 11/16/2017  . Aspiration pneumonia ([place]) 11/16/2017  . Elevated troponin 11/16/2017  . NSTEMI (non-ST elevated myocardial infarction) ([place]) 11/16/2017  . Constipation 11/10/2017  . Stress and adjustment reaction 10/13/2017  . Urticaria 10/13/2017  . Former smoker 10/13/2017  . Hypokalemia   . Cerebrovascular accident (CVA) due to occlusion of left middle cerebral artery ([place])   . Right hemiplegia ([place])   . Dysphagia, post-stroke   . Hyperlipidemia    Past Medical History:  Past Medical History:  Diagnosis Date  . Arthritis    "knees" (11/16/2017)  . Hay fever   . Pneumonia 11/16/2017  . Stroke [facility]) 08/23/2017   Left MCA infarct status post TPA and thrombectomy   Past Surgical History:  Past Surgical History:  Procedure Laterality Date  . CORONARY ARTERY BYPASS GRAFT N/A 11/23/2017   Procedure: CORONARY ARTERY BYPASS GRAFTING (CABG) x 2, ON PUMP, USING LEFT INTERNAL MAMMARY ARTERY AND RIGHT GREATER SAPHENOUS VEIN HARVESTED ENDOSCOPICALLY;  Surgeon: Gaye Pollack, MD;  Location: [place];  Service: Open Heart Surgery;  Laterality:  N/A;  . CRYOABLATION     cervical  . IR ANGIO INTRA EXTRACRAN SEL COM CAROTID INNOMINATE UNI R MOD SED  08/21/2017  . IR ANGIO VERTEBRAL SEL SUBCLAVIAN INNOMINATE UNI R MOD SED  08/21/2017  . IR PERCUTANEOUS ART THROMBECTOMY/INFUSION INTRACRANIAL INC DIAG ANGIO  08/21/2017  . IR RADIOLOGIST EVAL & MGMT  10/30/2017  . LOOP RECORDER INSERTION N/A 08/28/2017   Procedure: LOOP RECORDER INSERTION;  Surgeon: Constance Haw, MD;  Location: [place] CV LAB;  Service: Cardiovascular;  Laterality: N/A;  . RADIOLOGY WITH ANESTHESIA N/A 08/21/2017   Procedure: RADIOLOGY WITH ANESTHESIA;  Surgeon: Luanne Bras, MD;  Location: [place];  Service: Radiology;  Laterality: N/A;  . RIGHT/LEFT HEART CATH AND CORONARY ANGIOGRAPHY N/A 11/20/2017   Procedure: RIGHT/LEFT HEART CATH AND CORONARY ANGIOGRAPHY;  Surgeon: Jettie Booze, MD;  Location: [place] CV LAB;  Service: Cardiovascular;  Laterality: N/A;  . TEE WITHOUT CARDIOVERSION N/A 08/28/2017   Procedure: TRANSESOPHAGEAL ECHOCARDIOGRAM (TEE);  Surgeon: Fay Records, MD;  Location: [place];  Service: Cardiovascular;  Laterality: N/A;  . TEE WITHOUT CARDIOVERSION N/A 11/23/2017   Procedure: TRANSESOPHAGEAL ECHOCARDIOGRAM (TEE);  Surgeon: Gaye Pollack, MD;  Location: [place];  Service: Open Heart Surgery;  Laterality: N/A;  . TUBAL LIGATION     Social History:  reports that she quit smoking about 3 months ago. Her smoking use included cigarettes. She has a 4.44  pack-year smoking history. she has never used smokeless tobacco. She reports that she does not drink alcohol or use drugs.  Family / Support Systems Patient Roles: Spouse, Parent Spouse/Significant Other: [id number] [phone] Children: Two son's 57 & 11 Other Supports: Mom and brother in-law live with them on the farm Anticipated Caregiver: Husband Ability/Limitations of Caregiver: Husband is retired and was assisting prior to admission Caregiver  Availability: 24/7 Family Dynamics: Close knit family who all live on the same plot of land. All are very involved with each other and will do for one another. 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 Husband is here daily and will assist also   Abuse/Neglect Abuse/Neglect Assessment Can Be Completed: Yes Physical Abuse: Denies Verbal Abuse: Denies Sexual Abuse: Denies Exploitation of patient/patient's resources: Denies Self-Neglect: Denies  Emotional Status Pt's affect, behavior adn adjustment status: Pt is extremely tired today and falling asleep in therpaies and when trying to have a conversation. She voiced she didn't sleep last night and needs a nap. She is one who fought back from her stroke and was doing well and using a SBQC to ambulate. She needs to get stronger this time. Recent Psychosocial Issues: here in 07/2017 after her CVA, did well and was mod/i with her [facility] before this Pyschiatric History: No history deferred depression screen at this time due to adjusting to the unit and can't stay awake. Will re-assess and see if would benefit from seeing neuro-psych while here. She was seen on last admit and found it helpful. Will monitor while here and make referral when appropriate Substance Abuse History: Quit tobacco when had CVA 07/2017  Patient / Family Perceptions, Expectations & Goals Pt/Family understanding of illness & functional  limitations: Pt and husband can explain her surgery and precautions now. Can't believe all this has happened in such a short time with the stroke and now her heart issues. The do talk with the MD's and feel they have a good understanding of her treatment plan going forward. Premorbid pt/family roles/activities: Wife, Mother, daughter, sibling, farmer, etc Anticipated changes in roles/activities/participation: resume Pt/family expectations/goals: Pt states: " I want to get back to where I was before this happened, I know I can it will take time."  Husband states: " I hope she does well like last time, but feel bad about it happening to her. "  [employer]: Other (Comment)(was going to OP prior to admission) Premorbid Home Care/DME Agencies: Other (Comment)(had [facility] in the past) Transportation available at discharge: Husband and family Resource referrals recommended: Support group (specify)  Discharge Planning Living Arrangements: Spouse/significant other, Other relatives Support Systems: Spouse/significant other, Parent, Other relatives, Children, Friends/neighbors Type of Residence: Private residence Insurance Resources: Multimedia programmer (specify)(BCBS) Financial Resources: Family Support, Employment Financial Screen Referred: No Living Expenses: Own Money Management: Spouse, Family Does the patient have any problems obtaining your medications?: No Home Management: Pt directs or does if she is able Patient/Family Preliminary Plans: Return home with husband and family providing care, like they  did in Nov after stroke. Husband plans to be here daily and provide emotional support while here. Pt reassures husband it will be ok. She has always been the strong one and pulled the others forward. Social Work Anticipated Follow Up Needs: HH/OP, Support Group  Clinical Impression Pleasant female who is one who will do whatever is needed to achieve her goals. She did very  well last time here and should do well again as long as she can sleep and get on track. It will take a few days to adjust and get used to the program again. Very supportive and involved family who will assist if needed. Will await therapy team evaluations and work on discharge needs.  Elease Hashimoto 11/30/2017, 1:36 PM

## 2017-11-30 NOTE — Telephone Encounter (Signed)
Patients insurance is active and benefits verified through [place] - No co-pay, deductible amount of $200.00/$200.00 has been met, out of pocket amount of $600.00/$600.00 has been met, 30% co-insurance, and no pre-authorization is required. Passport/reference [phone]  Will contact patient to see if interested in CR. If interested, patient will need to complete follow up appt. Once completed, patient will be contacted for scheduling upon review by the RN Navigator.

## 2017-11-30 NOTE — Evaluation (Signed)
Speech Language Pathology Assessment and Plan  Patient Details  Name: [name] MRN: [id number] Date of Birth: 1960/12/19  SLP Diagnosis: Speech and Language deficits;Dysphagia  Rehab Potential: Good ELOS: 10-12 days     Today's Date: 11/30/2017 SLP Individual Time: 0800-0900 SLP Individual Time Calculation (min): 60 min   Problem List:  Patient Active Problem List   Diagnosis Date Noted  . Shortness of breath   . Essential hypertension   . Leukocytosis   . Hyponatremia   . Debility 11/29/2017  . Benign essential HTN   . Anxiety state   . Muscle spasm   . Community acquired pneumonia of right lower lobe of lung ([place])   . Hx of CABG   . Acute blood loss anemia   . Tachycardia   . Post-operative pain   . Diastolic dysfunction   . Tobacco abuse   . History of CVA with residual deficit   . Hyperglycemia   . Supplemental oxygen dependent   . 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 Coronary artery disease 11/23/2017  . Acute on chronic systolic heart failure ([place]) 11/21/2017  . Acute respiratory failure with hypoxia ([place]) 11/16/2017  . Aspiration pneumonia ([place]) 11/16/2017  . Elevated troponin 11/16/2017  . NSTEMI (non-ST elevated myocardial infarction) ([place]) 11/16/2017  . Constipation 11/10/2017  . Stress and adjustment reaction 10/13/2017  . Urticaria 10/13/2017  . Former smoker 10/13/2017  . Hypokalemia   . Cerebrovascular accident (CVA) due to occlusion of left middle cerebral artery ([place])   . Right hemiplegia ([place])   . Dysphagia, post-stroke   . Hyperlipidemia    Past Medical History:  Past Medical History:  Diagnosis Date  . Arthritis    "knees" (11/16/2017)  . Hay fever   . Pneumonia 11/16/2017  . Stroke [facility]) 08/23/2017   Left MCA infarct status post TPA and thrombectomy   Past Surgical History:  Past Surgical History:  Procedure Laterality Date  . CORONARY ARTERY BYPASS GRAFT N/A 11/23/2017   Procedure: CORONARY ARTERY BYPASS GRAFTING (CABG) x 2, ON PUMP,  USING LEFT INTERNAL MAMMARY ARTERY AND RIGHT GREATER SAPHENOUS VEIN HARVESTED ENDOSCOPICALLY;  Surgeon: Gaye Pollack, MD;  Location: [place];  Service: Open Heart Surgery;  Laterality: N/A;  . CRYOABLATION     cervical  . IR ANGIO INTRA EXTRACRAN SEL COM CAROTID INNOMINATE UNI R MOD SED  08/21/2017  . IR ANGIO VERTEBRAL SEL SUBCLAVIAN INNOMINATE UNI R MOD SED  08/21/2017  . IR PERCUTANEOUS ART THROMBECTOMY/INFUSION INTRACRANIAL INC DIAG ANGIO  08/21/2017  . IR RADIOLOGIST EVAL & MGMT  10/30/2017  . LOOP RECORDER INSERTION N/A 08/28/2017   Procedure: LOOP RECORDER INSERTION;  Surgeon: Constance Haw, MD;  Location: [place] CV LAB;  Service: Cardiovascular;  Laterality: N/A;  . RADIOLOGY WITH ANESTHESIA N/A 08/21/2017   Procedure: RADIOLOGY WITH ANESTHESIA;  Surgeon: Luanne Bras, MD;  Location: [place];  Service: Radiology;  Laterality: N/A;  . RIGHT/LEFT HEART CATH AND CORONARY ANGIOGRAPHY N/A 11/20/2017   Procedure: RIGHT/LEFT HEART CATH AND CORONARY ANGIOGRAPHY;  Surgeon: Jettie Booze, MD;  Location: [place] CV LAB;  Service: Cardiovascular;  Laterality: N/A;  . TEE WITHOUT CARDIOVERSION N/A 08/28/2017   Procedure: TRANSESOPHAGEAL ECHOCARDIOGRAM (TEE);  Surgeon: Fay Records, MD;  Location: [place];  Service: Cardiovascular;  Laterality: N/A;  . TEE WITHOUT CARDIOVERSION N/A 11/23/2017   Procedure: TRANSESOPHAGEAL ECHOCARDIOGRAM (TEE);  Surgeon: Gaye Pollack, MD;  Location: [place];  Service: Open Heart Surgery;  Laterality: N/A;  .  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 Per chart reviewand patient, patientlives with spouse. One level home  with 3 steps to entry. Ambulated short distances in the home and community with a quad cane. Patient did have a right foot drop. Modified independent with standing transfers into and out of wheelchair. Modified independence with ADLs. Presented 11/16/2017 with increasing shortness of breath. Chest x-ray suspicious for aspiration pneumonia. Troponin 0.4. Echocardiogram showed ST depressions in the anterior lateral leads. Echocardiogram with left ventricular systolic dysfunction with ejection fraction of 40%. Diffuse hypokinesis. Grade 2 diastolic dysfunction. Cardiac catheterization showed 75% mid to distal left main stenosis with significant calcification. There was 50% proximal left circumflex stenosis. Underwent CABG 2. 11/24/2017 per Dr. Cyndia Bent. Hospital course pain management. Sternal precautions as directed. Acute blood loss anemia monitored. Subcutaneous Lovenox for DVT prophylaxis. Tolerating a regular diet. Physical and occupational therapy evaluations completed with recommendations of physical medicine rehabilitation consult.Patient was admitted for a comprehensive rehabilitation program.  SLP evaluation completed on 11/30/2017 with the following results:  Pt presents with exacerbation of baseline cognitive-linguistic deficits in the setting of acute illness and subsequent debility.  Upon discharging from CIR, pt was overall supervision level assist for speech, language, and cognition.  Today pt needed min assist for tasks due to decreased selective attention, impaired executive functioning, decreased topic maintenance due to tangentiality and circumlocution as pt searches for words, and decreased storage and retrieval of information.  Suspect fatigue and decreased endurance to be causing these deficits and SLP is hopeful that pt will regain her previous level of independence as these underlying factors are treated. Pt presents with grossly intact oropharyngeal swallowing function when consuming regular  textures and thin liquids during breakfast meal and utilized swallowing precautions with mod I.  Pt had no overt s/s of aspiration with solids or liquids; however, given pt's decreased respiratory status and debility, would recommend follow up for at least 1 additional therapy session to ensure toleration of current diet.    As a result, pt would benefit from skilled ST while inpatient in order to maximize functional independence and reduce burden of care prior to discharge.  Pt may benefit from outpatient ST follow up post discharge but will update recommendations pending progress made while inpatient.       Skilled Therapeutic Interventions          Cognitive-linguistic evaluation completed with results and recommendations reviewed with patient.     SLP Assessment  Patient will need skilled Speech Lanaguage Pathology Services during CIR admission    Recommendations  SLP Diet Recommendations: Age appropriate regular solids;Thin Liquid Administration via: Cup;Straw Medication Administration: Whole meds with puree Supervision: Patient able to self feed Compensations: Slow rate;Small sips/bites;Minimize environmental distractions Postural Changes and/or Swallow Maneuvers: Seated upright 90 degrees Oral Care Recommendations: Oral care BID Recommendations for Other Services: Neuropsych consult Patient destination: Home Follow up Recommendations: Outpatient SLP;24 hour supervision/assistance Equipment Recommended: None recommended by SLP    SLP Frequency 3 to 5 out of 7 days   SLP Duration  SLP Intensity  SLP Treatment/Interventions 10-12 days   Minumum of 1-2 x/day, 30 to 90 minutes  Cognitive remediation/compensation;Cueing hierarchy;Internal/external aids;Environmental controls;Dysphagia/aspiration precaution training;Patient/family education;Speech/Language facilitation;Functional tasks  Pain Pain Assessment Pain Assessment: No/denies pain   Prior  Functioning Cognitive/Linguistic Baseline: Baseline deficits Baseline deficit details: prior CVA, word finding deficits, mild cognitive deficits; left CIR at min assist-supervision Type of Home: House  Lives With: Spouse Available Help at Discharge: Family;Available 24 hours/day Vocation: Unemployed  Function:  Eating Eating                 Cognition Comprehension Comprehension assist level: Follows basic conversation/direction with no assist  Expression   Expression assist level: Expresses basic 75 - 89% of the time/requires cueing 10 - 24% of the time. Needs helper to occlude trach/needs to repeat words.  Social Interaction Social Interaction assist level: Interacts appropriately with others with medication or extra time (anti-anxiety, antidepressant).  Problem Solving Problem solving assist level: Solves basic 75 - 89% of the time/requires cueing 10 - 24% of the time  Memory Memory assist level: Recognizes or recalls 75 - 89% of the time/requires cueing 10 - 24% of the time   Short Term Goals: Week 1: SLP Short Term Goal 1 (Week 1): Pt will use external aids to recall daily information with supervision verbal cues.  SLP Short Term Goal 2 (Week 1): Pt will selectively attend to tasks in a moderately distracting environment for 30 minutes with supervision verbal cues for redirection to task.  SLP Short Term Goal 3 (Week 1): Pt will self monitor and correct topic maintenance during conversations with therapist with min verbal cues.   SLP Short Term Goal 4 (Week 1): Pt will consume regular textures and thin liquids with mod I use of swallowing precautions for 1 additional therapy session to ensure toleration prior to discharge.   Refer to Care Plan for Long Term Goals  Recommendations for other services: Neuropsych  Discharge Criteria: Patient will be discharged from SLP if patient refuses treatment 3 consecutive times without medical reason, if treatment goals not met, if there  is a change in medical status, if patient makes no progress towards goals or if patient is discharged from hospital.  The above assessment, treatment plan, treatment alternatives and goals were discussed and mutually agreed upon: by patient  Emilio Math 11/30/2017, 11:38 AM

## 2017-11-30 NOTE — Progress Notes (Signed)
EKG done and Dan PA was notified.

## 2017-11-30 NOTE — Plan of Care (Signed)
LTG's initiated.

## 2017-11-30 NOTE — Care Management Note (Signed)
Inpatient [facility] Individual Statement of Services  Patient Name:  [name]  Date:  11/30/2017  Welcome to the [facility].  Our goal is to provide you with an individualized program based on your diagnosis and situation, designed to meet your specific needs.  With this comprehensive rehabilitation program, you will be expected to participate in at least 3 hours of rehabilitation therapies Monday-Friday, with modified therapy programming on the weekends.  Your rehabilitation program will include the following services:  Physical Therapy (PT), Occupational Therapy (OT), 24 hour per day rehabilitation nursing, Therapeutic Recreaction (TR), Case Management (Social Worker), Rehabilitation Medicine, Nutrition Services and Pharmacy Services  Weekly team conferences will be held on Wednesday to discuss your progress.  Your Social Worker will talk with you frequently to get your input and to update you on team discussions.  Team conferences with you and your family in attendance may also be held.  Expected length of stay: 7-10 days  Overall anticipated outcome: supervision set up  Depending on your progress and recovery, your program may change. Your Social Worker will coordinate services and will keep you informed of any changes. Your Social Worker's name and contact numbers are listed  below.  The following services may also be recommended but are not provided by the [facility]:    [place] will be made to provide these services after discharge if needed.  Arrangements include referral to agencies that provide these services.  Your insurance has been verified to be:  [place] Your primary doctor is:  Howard Pouch  Pertinent information will be shared with your doctor and your insurance company.  Social Worker:  Ovidio Kin, [place] or ([phone]  Information discussed with and copy given to patient by: Elease Hashimoto, 11/30/2017, 11:32 AM

## 2017-12-01 ENCOUNTER — Ambulatory Visit: Payer: BLUE CROSS/BLUE SHIELD | Admitting: Physical Therapy

## 2017-12-01 ENCOUNTER — Inpatient Hospital Stay (HOSPITAL_COMMUNITY): Payer: BLUE CROSS/BLUE SHIELD | Admitting: Occupational Therapy

## 2017-12-01 ENCOUNTER — Ambulatory Visit: Payer: BLUE CROSS/BLUE SHIELD | Admitting: Physical Medicine & Rehabilitation

## 2017-12-01 ENCOUNTER — Inpatient Hospital Stay (HOSPITAL_COMMUNITY): Payer: BLUE CROSS/BLUE SHIELD | Admitting: Physical Therapy

## 2017-12-01 ENCOUNTER — Encounter: Payer: BLUE CROSS/BLUE SHIELD | Admitting: Occupational Therapy

## 2017-12-01 DIAGNOSIS — R0989 Other specified symptoms and signs involving the circulatory and respiratory systems: Secondary | ICD-10-CM

## 2017-12-01 LAB — BASIC METABOLIC PANEL
ANION GAP: 13 (ref 5–15)
BUN: 18 mg/dL (ref 6–20)
CO2: 31 mmol/L (ref 22–32)
CREATININE: 0.79 mg/dL (ref 0.44–1.00)
Calcium: 8.7 mg/dL — ABNORMAL LOW (ref 8.9–10.3)
Chloride: 88 mmol/L — ABNORMAL LOW (ref 101–111)
GFR calc non Af Amer: >60 mL/min (ref >60)
Glucose, Bld: 141 mg/dL — ABNORMAL HIGH (ref 65–99)
Potassium: 3 mmol/L — ABNORMAL LOW (ref 3.5–5.1)
SODIUM: 132 mmol/L — AB (ref 135–145)

## 2017-12-01 NOTE — Progress Notes (Addendum)
Physical Therapy Session Note  Patient Details  Name: [name] MRN: [id number] Date of Birth: 1961/07/03  Today's Date: 12/01/2017 PT Individual Time: 1300-1400 PT Individual Time Calculation (min): 60 min   Short Term Goals: Week 1:  PT Short Term Goal 1 (Week 1): STG=LTG  Skilled Therapeutic Interventions/Progress Updates:    Pt seated in w/c in room, agreeable to participate in therapy session. Pt reports sternal pain this PM, pain is not rated. Pt also reports pain in RUE throughout therapy session and perseverates on tingling sensations felt in RUE, needs frequent redirection to focus on therapy tasks this PM. Ambulation x 100 ft with quad cane and CGA for balance while on room air, SpO2 92% and above. Pt exhibits decreased stance time on RLE as well as shortened step length. Standing RLE TKE x 10 reps, standing weight shift L/R with one UE support and focusing on decreasing RLE compensations when shifting weight onto the L. Nustep level 1 x 8 min with one rest break for endurance, LE only. Pt left supine in bed with needs in reach, bed alarm in place, family present.  Therapy Documentation Precautions:  Precautions Precautions: Fall, Sternal, Other (comment) Precaution Comments: Verbally reviewed sternal precautions; residual R-side weakness Restrictions Weight Bearing Restrictions: No RUE Weight Bearing: Non weight bearing LUE Weight Bearing: (sternal precautions) Other Position/Activity Restrictions: sternal precautions.    See Function Navigator for Current Functional Status.   Therapy/Group: Individual Therapy  Excell Seltzer, PT, DPT  12/01/2017, 3:46 PM

## 2017-12-01 NOTE — Progress Notes (Signed)
[facility] PHYSICAL MEDICINE & REHABILITATION     PROGRESS NOTE  Subjective/Complaints:  Pt seen sitting up at EOB this AM, eating breakfast.  She slept well overnight.  She states she feels better than yesterday.    ROS: Denies CP, nausea, vomiting, diarrhea.  Objective: Vital Signs: Blood pressure (!) 147/90, pulse 95, temperature 98 F (36.7 C), temperature source Oral, resp. rate 18, height 5\' 1"  (1.549 m), weight 56.5 kg (124 lb 8 oz), SpO2 100 %. Dg Chest 2 View  Result Date: 11/30/2017 CLINICAL DATA:  Shortness of breath, drowsiness. History of coronary artery disease and CABG, peripheral vascular disease, CVA, former smoker. EXAM: CHEST - 2 VIEW COMPARISON:  Portable chest x-ray of November 26, 2017 FINDINGS: The lungs are adequately inflated. The interstitial edema has markedly improved. The pulmonary vascularity is less engorged and more distinct. The cardiac silhouette remains enlarged. The sternal wires are intact. The left hemidiaphragm is better demonstrated. There are coarse lung markings at the left lung base still. IMPRESSION: Improving CHF with interval resolution of interstitial edema. Persistent left lower lobe atelectasis or pneumonia. Electronically Signed   By: David  Martinique M.D.   On: 11/30/2017 12:54   Recent Labs    11/30/17 0642  WBC 10.8*  HGB 9.5*  HCT 30.0*  PLT 444*   Recent Labs    11/30/17 0642  NA 133*  K 3.8  CL 88*  GLUCOSE 102*  BUN 15  CREATININE 0.81  CALCIUM 9.0   CBG (last 3)  No results for input(s): GLUCAP in the last 72 hours.  Wt Readings from Last 3 Encounters:  12/01/17 56.5 kg (124 lb 8 oz)  11/29/17 60.5 kg (133 lb 6.1 oz)  11/10/17 58.5 kg (129 lb)    Physical Exam:  BP (!) 147/90 (BP Location: Left Arm)   Pulse 95   Temp 98 F (36.7 C) (Oral)   Resp 18   Ht 5\' 1"  (1.549 m)   Wt 56.5 kg (124 lb 8 oz)   LMP  (LMP Unknown)   SpO2 100%   BMI 23.52 kg/m  Constitutional: She appears well-developed and well-nourished.   HENT: Normocephalic and atraumatic.  Eyes: EOM are normal. No discharge.  Cardiovascular: RRR. No JVD. Respiratory: Unlabored. Clear. +  GI: Bowel sounds are normal. Nondistended.  Musculoskeletal: No edema or tenderness in extremities  Neurological:  Lethargic.  Follows commands. Motor: RUE: 2-/5 proximal to distal with apraxia RLE: 4-/5 HF, KE 3+/5 ADF   Skin: Skin is warm and dry.  Psychiatric: Tangential at times.    Assessment/Plan: 1. Functional deficits secondary to debility which require 3+ hours per day of interdisciplinary therapy in a comprehensive inpatient rehab setting. Physiatrist is providing close team supervision and 24 hour management of active medical problems listed below. Physiatrist and rehab team continue to assess barriers to discharge/monitor patient progress toward functional and medical goals.  Function:  Bathing Bathing position   Position: Sitting EOB  Bathing parts Body parts bathed by patient: Right arm, Chest, Abdomen, Front perineal area, Buttocks, Right upper leg, Left upper leg, Right lower leg, Left lower leg Body parts bathed by helper: Left arm, Back  Bathing assist Assist Level: Touching or steadying assistance(Pt > 75%)      Upper Body Dressing/Undressing Upper body dressing   What is the patient wearing?: Pull over shirt/dress     Pull over shirt/dress - Perfomed by patient: Thread/unthread right sleeve, Thread/unthread left sleeve, Put head through opening, Pull shirt over trunk  Upper body assist Assist Level: Supervision or verbal cues      Lower Body Dressing/Undressing Lower body dressing   What is the patient wearing?: Pants, Ted Hose, Shoes     Pants- Performed by patient: Thread/unthread right pants leg, Thread/unthread left pants leg, Pull pants up/down           Shoes - Performed by patient: Don/doff right shoe, Don/doff left shoe         TED Hose - Performed by helper: Don/doff right TED hose,  Don/doff left TED hose  Lower body assist Assist for lower body dressing: Touching or steadying assistance (Pt > 75%)      Toileting Toileting   Toileting steps completed by patient: Adjust clothing prior to toileting, Performs perineal hygiene, Adjust clothing after toileting      Toileting assist     Transfers Chair/bed transfer   Chair/bed transfer method: Stand pivot Chair/bed transfer assist level: Touching or steadying assistance (Pt > 75%) Chair/bed transfer assistive device: Librarian, academic     Max distance: 35 Assist level: Touching or steadying assistance (Pt > 75%)   Wheelchair   Type: Manual Max wheelchair distance: 150 Assist Level: Supervision or verbal cues  Cognition Comprehension Comprehension assist level: Follows basic conversation/direction with extra time/assistive device  Expression Expression assist level: Expresses basic needs/ideas: With extra time/assistive device  Social Interaction Social Interaction assist level: Interacts appropriately with others with medication or extra time (anti-anxiety, antidepressant).  Problem Solving Problem solving assist level: Solves basic 75 - 89% of the time/requires cueing 10 - 24% of the time  Memory Memory assist level: Recognizes or recalls 75 - 89% of the time/requires cueing 10 - 24% of the time    Medical Problem List and Plan: 1.  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  DVT Prophylaxis/Anticoagulation: Subcutaneous Lovenox 3. Pain Management: Baclofen 5 mg TID,Oxycodone/Ultram as needed 4. Mood:Lexapro 10 mg daily  5. Neuropsych: This patient is capable of making decisions on her own behalf. 6. Skin/Wound Care: Routine skin checks 7. Fluids/Electrolytes/Nutrition: Routine I&Os 8.Hypertension.Coreg 3.125 mg BID,Lasix 40 mg daily   Extremely labile  with hypotension on 3/8 9. History of tobacco abuse. Provide counseling 10. Hyperlipidemia. Crestor 11. Constipation. Laxative assistance 12. SOB   Last CXR on 3/3 reviewed showing atelectasis. Repeat CXR reviewed, showing improvement    CO2 elevated on 3/7   ECG reviewed, relatively stable, read pending   Will DC continuous pulse ox   Wean supplemental oxygen as tolerated   13. Leukocytosis   WBCs 10.8 on 3/7   Afebrile   Labs pending 14. Acute blood loss anemia   Hemoglobin 9.5 on 3/7   Labs pending   Continue to monitor 15. Hyponatremia   Sodium 133 on 3/7   Continue to monitor  LOS (Days) 2 A FACE TO FACE EVALUATION WAS PERFORMED  Hien Perreira Lorie Phenix 12/01/2017 8:48 AM

## 2017-12-01 NOTE — Progress Notes (Signed)
Occupational Therapy Session Note  Patient Details  Name: [name] MRN: [id number] Date of Birth: Sep 29, 1960  Today's Date: 12/01/2017 OT Individual Time: 0835-1000 OT Individual Time Calculation (min): 85 min    Short Term Goals: Week 1:  OT Short Term Goal 1 (Week 1): STGs = LTGs due to ELOS  Skilled Therapeutic Interventions/Progress Updates:  Pt seen for skilled OT intervention. Pt completed bed mobility with vc and education provided via demonstration for adherence to sternal precautions during supine to sitting EOB. Demonstration provided re segmental trunk rotation in prep for functional mobility. Pt completed sit to stand transfer with supervision and cane, as well as CGA for 42ft functional mobility to toilet. Pt completed seated level peri-hygiene with supervision. Pt doffed UB and LB clothing from seated level on toilet with stand by assist. Pt transferred to shower chair with HHA. Pt provided with long handled sponge and given demonstration re washing under L arm. Pt performed UB/LB bathing from seated level with supervision.  She ambulated to EOB to continue dressing with steadying A in standing.    Pt did well on 1L of 02 so removed oxygen and pt maintained O2 sats above 92%.  Pt then was taken to gym via w/c and transferred to mat. Worked on bed mobility following sternal precautions.  In supine,   pt practiced bent knee sways for trunk rotation to decrease UE tone and for increased torso flexibility. Pt stated that it felt good to her low back.  Scapular mobilization with active protraction. Placing and holding activity of holding arm in extended position. Pt was able to maintain position for 5+ seconds.  Pt also had active tricep extension.   Pt's PT arrived for her next session.      Therapy Documentation Precautions:  Precautions Precautions: Fall, Sternal, Other (comment) Precaution Comments: Verbally reviewed sternal precautions; residual R-side  weakness Restrictions Weight Bearing Restrictions: No RUE Weight Bearing: Non weight bearing LUE Weight Bearing: (sternal precautions) Other Position/Activity Restrictions: sternal precautions.     Pain:  Pt reporting no pain this session.    See Function Navigator for Current Functional Status.   Therapy/Group: Individual Therapy  [place] 12/01/2017, 12:23 PM

## 2017-12-01 NOTE — Progress Notes (Signed)
      [address]       [place] [address]             [phone]         Subjective: Feels good today. Thought that yesterday she had a little too much pain medication and she felt sleepy.   Objective: Vital signs in last 24 hours: Temp:  [97.9 F (36.6 C)-98 F (36.7 C)] 98 F (36.7 C) (03/08 0453) Pulse Rate:  [78-95] 95 (03/08 0453) Resp:  [16-18] 18 (03/08 0453) BP: (90-147)/(56-90) 147/90 (03/08 0453) SpO2:  [98 %-100 %] 100 % (03/08 0453) Weight:  [124 lb 8 oz (56.5 kg)] 124 lb 8 oz (56.5 kg) (03/08 0453)     Intake/Output from previous day: 03/07 0701 - 03/08 0700 In: 420 [P.O.:420] Out: 250 [Urine:250] Intake/Output this shift: Total I/O In: 200 [P.O.:200] Out: -    Lab Results: Recent Labs    11/30/17 0642  WBC 10.8*  HGB 9.5*  HCT 30.0*  PLT 444*   BMET:  Recent Labs    11/30/17 0642 12/01/17 0750  NA 133* 132*  K 3.8 3.0*  CL 88* 88*  CO2 33* 31  GLUCOSE 102* 141*  BUN 15 18  CREATININE 0.81 0.79  CALCIUM 9.0 8.7*    PT/INR: No results for input(s): LABPROT, INR in the last 72 hours. ABG    Component Value Date/Time   PHART 7.341 (L) 11/23/2017 2320   HCO3 23.7 11/23/2017 2320   TCO2 26 11/24/2017 1619   ACIDBASEDEF 2.0 11/23/2017 2320   O2SAT 70.0 11/27/2017 0340   CBG (last 3)  No results for input(s): GLUCAP in the last 72 hours.  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 Inicisons are clean and healing well. Chest tube sutures remain in place. WBC 10.8 2. Weight continues to trend down and she is 124 lbs today. She remains on Lasix 40mg  daily with k-dur replacement.  3. HR and BP have been okay. Coreg decreased due to an episode of hypotension. ASA and Crestor.  4. Pain medication to be adjusted on patient's need however, discussed just Tylenol during the day and saving the Oxycodone for at night. The patient seemed agreeable.   Plan: Care per inpatient rehab team. Appears to be doing very well.     LOS: 2 days    Elgie Collard 12/01/2017

## 2017-12-01 NOTE — Progress Notes (Signed)
Physical Therapy Session Note  Patient Details  Name: [name] MRN: [id number] Date of Birth: 03-25-1961  Today's Date: 12/01/2017 PT Individual Time: 1000-1045 PT Individual Time Calculation (min): 45 min   Short Term Goals: Week 1:  PT Short Term Goal 1 (Week 1): STG=LTG  Skilled Therapeutic Interventions/Progress Updates:    Pt received from OT seated EOM in therapy gym. Pt reports some soreness at her chest incision, pain is not rated and pt is agreeable to participate in therapy session. Stand pivot transfer mat to w/c with quad cane and SBA/CGA. SpO2 92% on room air at rest, increased to 1L O2 for activity. Ambulation 2 x 47 ft with quad cane and CGA for balance, v/c for keeping head up during gait. SpO2 92% following gait on 1L O2. Sit to/from stand from mat to quad cane with min assist and 1" block under LLE to encourage weight shift to RLE. Pt requires manual facilitation to encourage weight shift onto RLE. Pt left seated in w/c in room at end of session with needs in reach and chair alarm in place, back on room air and SpO2 at 95%.  Therapy Documentation Precautions:  Precautions Precautions: Fall, Sternal, Other (comment) Precaution Comments: Verbally reviewed sternal precautions; residual R-side weakness Restrictions Weight Bearing Restrictions: No RUE Weight Bearing: Non weight bearing LUE Weight Bearing: (sternal precautions) Other Position/Activity Restrictions: sternal precautions.    See Function Navigator for Current Functional Status.   Therapy/Group: Individual Therapy  Excell Seltzer, PT, DPT  12/01/2017, 12:21 PM

## 2017-12-01 NOTE — Progress Notes (Signed)
Speech Language Pathology Daily Session Note  Patient Details  Name: [name] MRN: [id number] Date of Birth: Jul 16, 1961  Today's Date: 12/01/2017 SLP Individual Time: 1422-1445 SLP Individual Time Calculation (min): 23 min  Short Term Goals: Week 1: SLP Short Term Goal 1 (Week 1): Pt will use external aids to recall daily information with supervision verbal cues.  SLP Short Term Goal 2 (Week 1): Pt will selectively attend to tasks in a moderately distracting environment for 30 minutes with supervision verbal cues for redirection to task.  SLP Short Term Goal 3 (Week 1): Pt will self monitor and correct topic maintenance during conversations with therapist with min verbal cues.   SLP Short Term Goal 4 (Week 1): Pt will consume regular textures and thin liquids with mod I use of swallowing precautions for 1 additional therapy session to ensure toleration prior to discharge.   Skilled Therapeutic Interventions:   Pt was seen for skilled ST targeting cognitive-linguistic goals.  Pt was sitting up at edge of bed with RN present upon therapist's arrival.  Pt was much more alert and clear today in comparison to yesterday's evaluation.  Pt reports having too much pain medication yesterday.  Pt was able to maintain an appropriate topic of conversation for ~15 minutes with no cues needed for redirection and minimal, subtle instances of word finding difficulty.  Overall, today pt appears near her baseline from when she left CIR for her CVA.  Will follow up for 1-2 additional sessions to address swallowing and recall goals prior to discharge from [facility].    Function:  Eating Eating                 Cognition Comprehension Comprehension assist level: Follows basic conversation/direction with extra time/assistive device  Expression   Expression assist level: Expresses basic needs/ideas: With extra time/assistive device  Social Interaction Social Interaction assist level: Interacts  appropriately with others with medication or extra time (anti-anxiety, antidepressant).  Problem Solving Problem solving assist level: Solves basic 75 - 89% of the time/requires cueing 10 - 24% of the time  Memory Memory assist level: Recognizes or recalls 90% of the time/requires cueing < 10% of the time    Pain Pain Assessment Pain Assessment: No/denies pain  Therapy/Group: Individual Therapy  Lacey Dotson, Selinda Orion 12/01/2017, 3:15 PM

## 2017-12-02 ENCOUNTER — Inpatient Hospital Stay (HOSPITAL_COMMUNITY): Payer: BLUE CROSS/BLUE SHIELD | Admitting: Occupational Therapy

## 2017-12-02 ENCOUNTER — Inpatient Hospital Stay (HOSPITAL_COMMUNITY): Payer: BLUE CROSS/BLUE SHIELD

## 2017-12-02 ENCOUNTER — Inpatient Hospital Stay (HOSPITAL_COMMUNITY): Payer: BLUE CROSS/BLUE SHIELD | Admitting: Physical Therapy

## 2017-12-02 DIAGNOSIS — R739 Hyperglycemia, unspecified: Secondary | ICD-10-CM

## 2017-12-02 NOTE — Progress Notes (Signed)
Occupational Therapy Session Note  Patient Details  Name: [name] MRN: [id number] Date of Birth: 1961-04-01  Today's Date: 12/02/2017 OT Individual Time: 1100-1200 OT Individual Time Calculation (min): 60 min    Short Term Goals: Week 1:  OT Short Term Goal 1 (Week 1): STGs = LTGs due to ELOS  Skilled Therapeutic Interventions/Progress Updates:    Session 1:Treatment session focused on ADLs/self care training, transfer training, balance training, endurance training, and pt/caregiver education. Upon entering, pt up in wheelchair with husband present. She reported to have already completed ADLs this AM but wanted to put on her make up. Therapist simulated at home make up routine in pts room. Pt completed there activities to practice functional transfers. Pt required verbal reminders for Sternal precautions throughout transfer practice. Therapist provided instruction and verbal prompts for safety awareness and suggestions for more energy efficient transfers. Pt/husband educated on techniques for positioning during sleep for max comfort. Pt completed functional mobility with quad cane down hallway to practice safe mobility in environment with CGA and endurance for ADLs. Pt returned to w/c with all needs met and repositioned self in room with husband and company present to visit.   Session 2: Treatment session focused on ADLs/self care training and there activity for balance and transfer training. Pt completed toileting task 2x with CGA and v/c for safety throughout session d/t urgencies. Pt participated in there activities for standing tolerance/endruacne and functional reaching at mid line level during table top activities. Tolerated standing for up to 8 minutes with good base of support and endurance. No c/o pain during session. Pt returned to room via w/c with  Husband to rest with needs met.   Therapy Documentation Precautions:  Precautions Precautions: Fall, Sternal, Other  (comment) Precaution Comments: Verbally reviewed sternal precautions; residual R-side weakness Restrictions Weight Bearing Restrictions: Yes RUE Weight Bearing: Non weight bearing LUE Weight Bearing: (sternal precautions) Other Position/Activity Restrictions: sternal precautions.     Pain: Pain Assessment Pain Assessment: Faces Pain Score: 5  Faces Pain Scale: Hurts a little bit Pain Type: Surgical pain Pain Location: Chest Pain Orientation: Medial Pain Descriptors / Indicators: Aching Pain Frequency: Constant Pain Onset: On-going Patients Stated Pain Goal: 0 Pain Intervention(s): Repositioned ADL: ADL ADL Comments: Refer to functional navigator  See Function Navigator for Current Functional Status.   Therapy/Group: Individual Therapy  Delon Sacramento 12/02/2017, 12:39 PM

## 2017-12-02 NOTE — Progress Notes (Signed)
Physical Therapy Session Note  Patient Details  Name: [name] MRN: [id number] Date of Birth: 12-29-1960  Today's Date: 12/02/2017 PT Individual Time: 0800-0915 PT Individual Time Calculation (min): 75 min   Short Term Goals: Week 1:  PT Short Term Goal 1 (Week 1): STG=LTG  Skilled Therapeutic Interventions/Progress Updates:    Pt seated in w/c in room, agreeable to participate in therapy session. Pt reports soreness at chest incision, pain is not rated. Pt changes her shirt while seated in w/c with setup assist. Sit to stand with CGA, CGA for balance while changing pants. Dependently assisted pt with donning TED hose and shoes to save time. Pt brushes her teeth with setup assist. Ambulation x 190 ft with quad cane and CGA. SpO2 drops to 80% with ambulation while on room air, increased to 1L O2 and SpO2 remains at 92% throughout rest of session with activity while on 1L. Ascend/desend 1" step with quad cane and min assist. Attempt to ascend/descend 4" step with quad cane and mod assist, pt loses her balance and requires mod assist to regain standing balance. Ascend/descend 4" step in // bars with min assist. Pt exhibits RLE hip hike and circumduction when bringing RLE up onto step. Pt reports feeling fatigued at end of therapy session. Assisted pt back to bed with SBA. Pt left supine in bed with needs in reach and bed alarm in place.  Therapy Documentation Precautions:  Precautions Precautions: Fall, Sternal, Other (comment) Precaution Comments: Verbally reviewed sternal precautions; residual R-side weakness Restrictions Weight Bearing Restrictions: No RUE Weight Bearing: Non weight bearing LUE Weight Bearing: (sternal precautions) Other Position/Activity Restrictions: sternal precautions.    See Function Navigator for Current Functional Status.   Therapy/Group: Individual Therapy   Excell Seltzer, PT, DPT  12/02/2017, 9:42 AM

## 2017-12-02 NOTE — Plan of Care (Signed)
  RH PAIN MANAGEMENT RH STG PAIN MANAGED AT OR BELOW PT'S PAIN GOAL Description At or below level 4  12/02/2017 0410 - Progressing by Lysbeth Penner D, RN  Continue to administered pain regimen  as needed.

## 2017-12-02 NOTE — Progress Notes (Signed)
Speech Language Pathology Daily Session Note  Patient Details  Name: [name] MRN: [id number] Date of Birth: 1961/01/25  Today's Date: 12/02/2017 SLP Individual Time: 5916-3846 SLP Individual Time Calculation (min): 28 min  Short Term Goals: Week 1: SLP Short Term Goal 1 (Week 1): Pt will use external aids to recall daily information with supervision verbal cues.  SLP Short Term Goal 2 (Week 1): Pt will selectively attend to tasks in a moderately distracting environment for 30 minutes with supervision verbal cues for redirection to task.  SLP Short Term Goal 3 (Week 1): Pt will self monitor and correct topic maintenance during conversations with therapist with min verbal cues.   SLP Short Term Goal 4 (Week 1): Pt will consume regular textures and thin liquids with mod I use of swallowing precautions for 1 additional therapy session to ensure toleration prior to discharge.   Skilled Therapeutic Interventions: Pt seen this date to address dysphagia and cognitive goals. SLP facilitated this session by providing Supervision questioning cues for recall of list of 4 novel words given pt unable to immediately recall all 4 words despite multiple rehearsals; when pt wrote words down, she recalled 3 out of 4 vs 2 out of 4 when words were repeated. Pt continues to present with poor topic maintenance, circumlocutions, and disorganization during conversational tasks. Pt does endorses changes in recall since this admission. May consider completing sequencing and additional recall tasks next session.   Re: deglutition, pt given thin liquid via straw and regular diet textures with no evidence of oral or suspected pharyngeal dysphagia. Pt with adequate bolus formation + manipulation, A-P transit, and timely oral clearance with no evidence of pharyngeal dysphagia - no cough, throat clear, or change in vocal quality appreciated with seemingly timely swallow initiation and hyolaryngeal elevation to palpation.  Continue to recommend regular diet textures with thin liquids given adherence to general aspiration precautions. Pt left upright in wc with all needs within reach and family present. SLP to continue current POC.   Function:  Eating Eating     Eating Assist Level: Set up assist for           Cognition Comprehension Comprehension assist level: Follows basic conversation/direction with no assist  Expression   Expression assist level: Expresses basic 90% of the time/requires cueing < 10% of the time.  Social Interaction Social Interaction assist level: Interacts appropriately with others with medication or extra time (anti-anxiety, antidepressant).  Problem Solving Problem solving assist level: Solves basic 90% of the time/requires cueing < 10% of the time  Memory Memory assist level: Recognizes or recalls 90% of the time/requires cueing < 10% of the time    Pain Pain Assessment Pain Assessment: No/denies pain  Therapy/Group: Individual Therapy  Kenyon Eshleman A Lakoda Mcanany 12/02/2017, 4:02 PM

## 2017-12-02 NOTE — Progress Notes (Signed)
[facility] PHYSICAL MEDICINE & REHABILITATION     PROGRESS NOTE  Subjective/Complaints:  Pt seen sitting up in bed this AM working on her IS.  She states she slept well overnight.  She notes some SOB this AM, but is comfortable. Encourages slow deep breathing.  ROS: +SOB. Denies CP, nausea, vomiting, diarrhea.  Objective: Vital Signs: Blood pressure (!) 115/56, pulse 89, temperature 98.2 F (36.8 C), temperature source Oral, resp. rate 15, height 5\' 1"  (1.549 m), weight 56.3 kg (124 lb 1.6 oz), SpO2 93 %. No results found. Recent Labs    11/30/17 0642  WBC 10.8*  HGB 9.5*  HCT 30.0*  PLT 444*   Recent Labs    11/30/17 0642 12/01/17 0750  NA 133* 132*  K 3.8 3.0*  CL 88* 88*  GLUCOSE 102* 141*  BUN 15 18  CREATININE 0.81 0.79  CALCIUM 9.0 8.7*   CBG (last 3)  No results for input(s): GLUCAP in the last 72 hours.  Wt Readings from Last 3 Encounters:  12/02/17 56.3 kg (124 lb 1.6 oz)  11/29/17 60.5 kg (133 lb 6.1 oz)  11/10/17 58.5 kg (129 lb)    Physical Exam:  BP (!) 115/56 (BP Location: Left Arm)   Pulse 89   Temp 98.2 F (36.8 C) (Oral)   Resp 15   Ht 5\' 1"  (1.549 m)   Wt 56.3 kg (124 lb 1.6 oz)   LMP  (LMP Unknown)   SpO2 93%   BMI 23.45 kg/m  Constitutional: She appears well-developed and well-nourished.  HENT: Normocephalic and atraumatic.  Eyes: EOM are normal. No discharge.  Cardiovascular: RRR. No JVD. Respiratory: Slightly increased WOB. Clear.  GI: Bowel sounds are normal. Nondistended.  Musculoskeletal: No edema or tenderness in extremities  Neurological:  Lethargic.  Follows commands. Motor: RUE: 2-/5 proximal to distal with apraxia (stable) RLE: 4-/5 HF, KE 3+/5 ADF   Skin: Skin is warm and dry.  Psychiatric: Tangential at times.    Assessment/Plan: 1. Functional deficits secondary to debility which require 3+ hours per day of interdisciplinary therapy in a comprehensive inpatient rehab setting. Physiatrist is providing close team  supervision and 24 hour management of active medical problems listed below. Physiatrist and rehab team continue to assess barriers to discharge/monitor patient progress toward functional and medical goals.  Function:  Bathing Bathing position   Position: Shower  Bathing parts Body parts bathed by patient: Right arm, Left arm, Chest, Abdomen, Front perineal area, Buttocks, Right upper leg, Left upper leg, Right lower leg, Left lower leg, Back Body parts bathed by helper: Left arm, Back  Bathing assist Assist Level: Touching or steadying assistance(Pt > 75%)      Upper Body Dressing/Undressing Upper body dressing   What is the patient wearing?: Pull over shirt/dress     Pull over shirt/dress - Perfomed by patient: Thread/unthread right sleeve, Thread/unthread left sleeve, Put head through opening, Pull shirt over trunk          Upper body assist Assist Level: Supervision or verbal cues      Lower Body Dressing/Undressing Lower body dressing   What is the patient wearing?: Pants, Non-skid slipper socks     Pants- Performed by patient: Thread/unthread right pants leg, Thread/unthread left pants leg, Pull pants up/down   Non-skid slipper socks- Performed by patient: Don/doff right sock, Don/doff left sock       Shoes - Performed by patient: Don/doff right shoe, Don/doff left shoe  TED Hose - Performed by helper: Don/doff right TED hose, Don/doff left TED hose  Lower body assist Assist for lower body dressing: Touching or steadying assistance (Pt > 75%)      Toileting Toileting   Toileting steps completed by patient: Adjust clothing prior to toileting, Performs perineal hygiene, Adjust clothing after toileting      Toileting assist Assist level: Supervision or verbal cues   Transfers Chair/bed transfer   Chair/bed transfer method: Ambulatory Chair/bed transfer assist level: Touching or steadying assistance (Pt > 75%) Chair/bed transfer assistive device:  Librarian, academic     Max distance: 190' Assist level: Touching or steadying assistance (Pt > 75%)   Wheelchair   Type: Manual Max wheelchair distance: 150 Assist Level: Supervision or verbal cues  Cognition Comprehension Comprehension assist level: Follows basic conversation/direction with no assist  Expression Expression assist level: Expresses basic 90% of the time/requires cueing < 10% of the time.  Social Interaction Social Interaction assist level: Interacts appropriately with others with medication or extra time (anti-anxiety, antidepressant).  Problem Solving Problem solving assist level: Solves basic 90% of the time/requires cueing < 10% of the time  Memory Memory assist level: Recognizes or recalls 90% of the time/requires cueing < 10% of the time    Medical Problem List and Plan: 1.  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  DVT Prophylaxis/Anticoagulation: Subcutaneous Lovenox 3. Pain Management: Baclofen 5 mg TID,Oxycodone/Ultram as needed 4. Mood:Lexapro 10 mg daily  5. Neuropsych: This patient is capable of making decisions on her own behalf. 6. Skin/Wound Care: Routine skin checks 7. Fluids/Electrolytes/Nutrition: Routine I&Os 8.Hypertension.Coreg 3.125 mg BID,Lasix 40 mg daily   Labile on 3/9 9. History of tobacco abuse. Provide counseling 10. Hyperlipidemia. Crestor 11. Constipation. Laxative assistance 12. SOB   Last CXR on 3/3 reviewed showing atelectasis. Repeat CXR reviewed, showing improvement    ECG reviewed, relatively stable, read pending   DCed continuous pulse ox   Wean supplemental oxygen as tolerated   13. Leukocytosis   WBCs 10.8 on 3/7   Afebrile   Labs ordered for Monday 14. Acute blood loss anemia   Hemoglobin 9.5 on 3/7   Labs ordered for Monday   Continue to monitor 15.  Hyponatremia   Sodium 132 on 3/8  Labs ordered for Monday, will consider d/cing Lasix if continues to trend down   Continue to monitor 16. Hyperglycemia   Elevated on 3/8   Cont to monitor  LOS (Days) 3 A FACE TO FACE EVALUATION WAS PERFORMED  [name] Lorie Phenix 12/02/2017 1:06 PM

## 2017-12-03 ENCOUNTER — Inpatient Hospital Stay (HOSPITAL_COMMUNITY): Payer: BLUE CROSS/BLUE SHIELD

## 2017-12-03 NOTE — Progress Notes (Signed)
[facility] PHYSICAL MEDICINE & REHABILITATION     PROGRESS NOTE  Subjective/Complaints:  Patient seen sitting up in bed this morning.  She states she did not sleep well overnight because they were cleaning the room across, and it was very loud.  She states she only has one therapy session today but wants to be active and plans to work out with her husband.  ROS: Denies CP, nausea, vomiting, diarrhea.  Objective: Vital Signs: Blood pressure (!) 100/53, pulse 89, temperature 97.8 F (36.6 C), temperature source Oral, resp. rate 16, height 5\' 1"  (1.549 m), weight 56.3 kg (124 lb 1.9 oz), SpO2 96 %. No results found. No results for input(s): WBC, HGB, HCT, PLT in the last 72 hours. Recent Labs    12/01/17 0750  NA 132*  K 3.0*  CL 88*  GLUCOSE 141*  BUN 18  CREATININE 0.79  CALCIUM 8.7*   CBG (last 3)  No results for input(s): GLUCAP in the last 72 hours.  Wt Readings from Last 3 Encounters:  12/03/17 56.3 kg (124 lb 1.9 oz)  11/29/17 60.5 kg (133 lb 6.1 oz)  11/10/17 58.5 kg (129 lb)    Physical Exam:  BP (!) 100/53   Pulse 89   Temp 97.8 F (36.6 C) (Oral)   Resp 16   Ht 5\' 1"  (1.549 m)   Wt 56.3 kg (124 lb 1.9 oz)   LMP  (LMP Unknown)   SpO2 96%   BMI 23.45 kg/m  Constitutional: She appears well-developed and well-nourished.  HENT: Normocephalic and atraumatic.  Eyes: EOM are normal. No discharge.  Cardiovascular: RRR. No JVD. Respiratory: Slightly increased WOB.  Clear.  GI: Bowel sounds are normal. Nondistended.  Musculoskeletal: No edema or tenderness in extremities  Neurological:  Lethargic.  Follows commands. Motor: RUE: 2-/5 proximal to distal with apraxia (unchanged) RLE: 4-/5 HF, KE 3+/5 ADF   Skin: Skin is warm and dry.  Psychiatric: Tangential at times.    Assessment/Plan: 1. Functional deficits secondary to debility which require 3+ hours per day of interdisciplinary therapy in a comprehensive inpatient rehab setting. Physiatrist is providing  close team supervision and 24 hour management of active medical problems listed below. Physiatrist and rehab team continue to assess barriers to discharge/monitor patient progress toward functional and medical goals.  Function:  Bathing Bathing position   Position: Shower  Bathing parts Body parts bathed by patient: Right arm, Left arm, Chest, Abdomen, Front perineal area, Buttocks, Right upper leg, Left upper leg, Right lower leg, Left lower leg, Back Body parts bathed by helper: Left arm, Back  Bathing assist Assist Level: Touching or steadying assistance(Pt > 75%)      Upper Body Dressing/Undressing Upper body dressing   What is the patient wearing?: Pull over shirt/dress     Pull over shirt/dress - Perfomed by patient: Thread/unthread right sleeve, Thread/unthread left sleeve, Put head through opening, Pull shirt over trunk          Upper body assist Assist Level: Supervision or verbal cues      Lower Body Dressing/Undressing Lower body dressing   What is the patient wearing?: Pants, Non-skid slipper socks     Pants- Performed by patient: Thread/unthread right pants leg, Thread/unthread left pants leg, Pull pants up/down   Non-skid slipper socks- Performed by patient: Don/doff right sock, Don/doff left sock       Shoes - Performed by patient: Don/doff right shoe, Don/doff left shoe         TED Hose -  Performed by helper: Don/doff right TED hose, Don/doff left TED hose  Lower body assist Assist for lower body dressing: Touching or steadying assistance (Pt > 75%)      Toileting Toileting   Toileting steps completed by patient: Adjust clothing prior to toileting, Performs perineal hygiene, Adjust clothing after toileting      Toileting assist Assist level: Supervision or verbal cues   Transfers Chair/bed transfer   Chair/bed transfer method: Ambulatory Chair/bed transfer assist level: Touching or steadying assistance (Pt > 75%) Chair/bed transfer assistive  device: Librarian, academic     Max distance: 120 ft Assist level: Touching or steadying assistance (Pt > 75%)   Wheelchair   Type: Manual Max wheelchair distance: 150 Assist Level: Supervision or verbal cues  Cognition Comprehension Comprehension assist level: Follows basic conversation/direction with no assist  Expression Expression assist level: Expresses basic 90% of the time/requires cueing < 10% of the time.  Social Interaction Social Interaction assist level: Interacts appropriately with others with medication or extra time (anti-anxiety, antidepressant).  Problem Solving Problem solving assist level: Solves basic 90% of the time/requires cueing < 10% of the time  Memory Memory assist level: Recognizes or recalls 90% of the time/requires cueing < 10% of the time    Medical Problem List and Plan: 1.  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  DVT Prophylaxis/Anticoagulation: Subcutaneous Lovenox 3. Pain Management: Baclofen 5 mg TID,Oxycodone/Ultram as needed 4. Mood:Lexapro 10 mg daily  5. Neuropsych: This patient is capable of making decisions on her own behalf. 6. Skin/Wound Care: Routine skin checks 7. Fluids/Electrolytes/Nutrition: Routine I&Os 8.Hypertension.Coreg 3.125 mg BID,Lasix 40 mg daily   Labile on 3/10 9. History of tobacco abuse. Provide counseling 10. Hyperlipidemia. Crestor 11. Constipation. Laxative assistance 12. SOB   Last CXR on 3/3 reviewed showing atelectasis. Repeat CXR reviewed, showing improvement    ECG reviewed, relatively stable, read pending   DCed continuous pulse ox   Wean supplemental oxygen as tolerated     Improving 13. Leukocytosis   WBCs 10.8 on 3/7   Afebrile   Labs ordered for tomorrow 14. Acute blood loss anemia   Hemoglobin 9.5 on 3/7   Labs ordered for tomorrow    Continue to monitor 15. Hyponatremia   Sodium 132 on 3/8  Labs ordered for tomorrow, will consider d/cing Lasix if continues to trend down   Continue to monitor 16. Hyperglycemia   Elevated on 3/8   Cont to monitor  LOS (Days) 4 A FACE TO FACE EVALUATION WAS PERFORMED  Ritaj Dullea Lorie Phenix 12/03/2017 2:08 PM

## 2017-12-03 NOTE — Progress Notes (Signed)
Physical Therapy Session Note  Patient Details  Name: [name] MRN: [id number] Date of Birth: [birth year]  Today's Date: 12/03/2017 PT Individual Time: 1116-1200, 1341-1406 PT Individual Time Calculation (min): 44 min , 25 min   Short Term Goals: Week 1:  PT Short Term Goal 1 (Week 1): STG=LTG  Skilled Therapeutic Interventions/Progress Updates:    Session 1: Pt seated in w/c upon PT arrival, agreeable to therapy tx and denies pain. Pt transported to dayroom in w/c, total assist. Pt ambulated 2 x 80 ft with QC and min assist, verbal cues for increased L step length, working on activity tolerance and endurance with SpO2 94% on RA. Pt used nustep x 8 minutes on workload 5 using LEs only for strengthening and endurance x 6 minutes, SpO2 97% on RA. Therapist performed manual hamstring stretches for flexibility and ROM, 2 x 1 min bilaterally. Pt ambulated from gym>room x 120 ft with QC and min assist, verbal cues for techniques. Pt's husband [name]) checked off and approved for transfers within the room to bed and to bathroom. Pt left seated in w/c in care of husband, needs in reach.   Session 2: Pt supine in bed upon PT arrival, agreeable to therapy tx and rates pain 4/10 in R arm and L LE. Pt propelled w/c from room>gym using L LE, with supervision. Pt ambulated x 80 ft and x 110 ft with QC and min assist working on endurance and gait. Pt worked on dynamic standing balance to perform toe taps on 4 inch step, min assist x 2 trials. Pt standing on wedge working on static standing balance and heel cord stretch bilaterally 2 x 1 min. Pt transported back to room and left seated with needs in reach, husband present.    Therapy Documentation Precautions:  Precautions Precautions: Fall, Sternal, Other (comment) Precaution Comments: Verbally reviewed sternal precautions; residual R-side weakness Restrictions Weight Bearing Restrictions: No RUE Weight Bearing: (sternal precaution) LUE Weight  Bearing: (sternal precaution) Other Position/Activity Restrictions: sternal precautions.     See Function Navigator for Current Functional Status.   Therapy/Group: Individual Therapy  Netta Corrigan, PT, DPT 12/03/2017, 8:04 AM

## 2017-12-04 ENCOUNTER — Inpatient Hospital Stay (HOSPITAL_COMMUNITY): Payer: BLUE CROSS/BLUE SHIELD

## 2017-12-04 ENCOUNTER — Inpatient Hospital Stay (HOSPITAL_COMMUNITY): Payer: BLUE CROSS/BLUE SHIELD | Admitting: Occupational Therapy

## 2017-12-04 ENCOUNTER — Ambulatory Visit: Payer: BLUE CROSS/BLUE SHIELD | Admitting: Physical Therapy

## 2017-12-04 ENCOUNTER — Encounter: Payer: BLUE CROSS/BLUE SHIELD | Admitting: Occupational Therapy

## 2017-12-04 ENCOUNTER — Telehealth: Payer: Self-pay

## 2017-12-04 LAB — BASIC METABOLIC PANEL
ANION GAP: 9 (ref 5–15)
BUN: 20 mg/dL (ref 6–20)
CALCIUM: 8.9 mg/dL (ref 8.9–10.3)
CHLORIDE: 99 mmol/L — AB (ref 101–111)
CO2: 27 mmol/L (ref 22–32)
CREATININE: 0.7 mg/dL (ref 0.44–1.00)
GFR calc non Af Amer: >60 mL/min (ref >60)
Glucose, Bld: 107 mg/dL — ABNORMAL HIGH (ref 65–99)
Potassium: 4.6 mmol/L (ref 3.5–5.1)
SODIUM: 135 mmol/L (ref 135–145)

## 2017-12-04 LAB — CBC WITH DIFFERENTIAL/PLATELET
BASOS ABS: 0 10*3/uL (ref 0.0–0.1)
Basophils Relative: 0 %
EOS PCT: 5 %
Eosinophils Absolute: 0.6 10*3/uL (ref 0.0–0.7)
HCT: 30.1 % — ABNORMAL LOW (ref 36.0–46.0)
Hemoglobin: 9.3 g/dL — ABNORMAL LOW (ref 12.0–15.0)
LYMPHS ABS: 2.4 10*3/uL (ref 0.7–4.0)
Lymphocytes Relative: 20 %
MCH: 28.9 pg (ref 26.0–34.0)
MCHC: 30.9 g/dL (ref 30.0–36.0)
MCV: 93.5 fL (ref 78.0–100.0)
MONO ABS: 1.5 10*3/uL — AB (ref 0.1–1.0)
MONOS PCT: 13 %
NEUTROS ABS: 7.4 10*3/uL (ref 1.7–7.7)
Neutrophils Relative %: 62 %
PLATELETS: 542 10*3/uL — AB (ref 150–400)
RBC: 3.22 MIL/uL — ABNORMAL LOW (ref 3.87–5.11)
RDW: 15.2 % (ref 11.5–15.5)
WBC: 11.9 10*3/uL — AB (ref 4.0–10.5)

## 2017-12-04 NOTE — Progress Notes (Signed)
Occupational Therapy Session Note  Patient Details  Name: [name] MRN: [id number] Date of Birth: Jul 31, 1961  Today's Date: 12/04/2017 OT Individual Time: 9935-7017 OT Individual Time Calculation (min): 28 min    Short Term Goals: Week 1:  OT Short Term Goal 1 (Week 1): STGs = LTGs due to ELOS  Skilled Therapeutic Interventions/Progress Updates:    Upon entering the room, pt seated in wheelchair awaiting therapist arrival. Pt with no c/o pain and agreeable to OT intervention. Pt engaged in R UE NMR this session with focus on pursed lip breathing during exercise. Pt performed AAROM elbow flexion/ext 3 sets of 10. PROM for wrist and digits in all planes. OT discussed and educated pt on PNF movement patterns once laying in supine. Husband present as well during this time. Pt donned resting hand splint with min A from OT. Visitors arriving as therapist exited the room. Call bell and all needed items within reach.   Therapy Documentation Precautions:  Precautions Precautions: Fall, Sternal, Other (comment) Precaution Comments: Verbally reviewed sternal precautions; residual R-side weakness Restrictions Weight Bearing Restrictions: No RUE Weight Bearing: (sternal precaution) LUE Weight Bearing: (sternal precaution) Other Position/Activity Restrictions: sternal precautions.   General:   Vital Signs: Therapy Vitals Temp: 98.4 F (36.9 C) Temp Source: Oral Pulse Rate: 71 Resp: 16 BP: (!) 103/50 Patient Position (if appropriate): Lying Oxygen Therapy SpO2: 93 % O2 Device: Room Air Pain: Pain Assessment Pain Assessment: No/denies pain Pain Score: 4  Faces Pain Scale: No hurt Pain Type: Surgical pain Pain Location: Chest Pain Intervention(s): Medication (See eMAR) ADL: ADL ADL Comments: Refer to functional navigator   See Function Navigator for Current Functional Status.   Therapy/Group: Individual Therapy  [name] 12/04/2017, 4:22 PM

## 2017-12-04 NOTE — Progress Notes (Signed)
Physical Therapy Session Note  Patient Details  Name: [name] MRN: [id number] Date of Birth: [birth year]  Today's Date: 12/04/2017 PT Individual Time: 0901-0959, 1331-1400 PT Individual Time Calculation (min): 58 min , 29 min  Short Term Goals: Week 1:  PT Short Term Goal 1 (Week 1): STG=LTG  Skilled Therapeutic Interventions/Progress Updates:   Session 1:  Pt supine in bed upon PT arrival, agreeable to therapy tx and denies pain. Pt transferred supine>sitting EOB with supervision and donned pants, socks and shoes with set up assist. Pt transferred from bed>w/c with min assist, stand pivot, transported to the gym. Pt ambulated x 80 ft with QC and stand by assist, working on endurance, verbal cues for step length. Pt ascended/descended 4 inch step x 2 using QC and min assist, verbal cues for techniques. Pt worked on dynamic standing balance to perform card matching activity while standing on foam, min assist. Pt ambulated x 80 ft and x 98 ft with QC and stand by assist, limited by L knee pain. Pt left seated in w/c at end of session with needs in reach.   Session 2: Pt seated in w/c upon PT arrival, agreeable to therapy tx and denies pain. Pt transported to gym in w/c. Pt ambulatedx 110 ft with min assist and use of quad cane, verbal cues for techniques, working on endurance. Pt performed 2 x 10 sit<>stands with emphasis on R LE use for strengthening and LE NMR, manual facilitation for weightbearing. Pt performed stepping in place with L LE x 10 for weightbearing and loading R LE, no AD and min assist. Pt transported back to room and left seated with needs in reach.   Therapy Documentation Precautions:  Precautions Precautions: Fall, Sternal, Other (comment) Precaution Comments: Verbally reviewed sternal precautions; residual R-side weakness Restrictions Weight Bearing Restrictions: No RUE Weight Bearing: (sternal precaution) LUE Weight Bearing: (sternal precaution) Other  Position/Activity Restrictions: sternal precautions.     See Function Navigator for Current Functional Status.   Therapy/Group: Individual Therapy  Netta Corrigan, PT, DPT 12/04/2017, 7:40 AM

## 2017-12-04 NOTE — Progress Notes (Signed)
[place] PHYSICAL MEDICINE & REHABILITATION     PROGRESS NOTE  Subjective/Complaints:  Patient seen lying bed this morning. She states she slept well overnight. She notes slight improvement in her incisional pain.  ROS: Denies CP, nausea, vomiting, diarrhea.  Objective: Vital Signs: Blood pressure (!) 110/57, pulse 77, temperature 98.2 F (36.8 C), temperature source Oral, resp. rate 17, height 5\' 1"  (1.549 m), weight 57 kg (125 lb 10.6 oz), SpO2 94 %. No results found. No results for input(s): WBC, HGB, HCT, PLT in the last 72 hours. Recent Labs    12/01/17 0750  NA 132*  K 3.0*  CL 88*  GLUCOSE 141*  BUN 18  CREATININE 0.79  CALCIUM 8.7*   CBG (last 3)  No results for input(s): GLUCAP in the last 72 hours.  Wt Readings from Last 3 Encounters:  12/04/17 57 kg (125 lb 10.6 oz)  11/29/17 60.5 kg (133 lb 6.1 oz)  11/10/17 58.5 kg (129 lb)    Physical Exam:  BP (!) 110/57 (BP Location: Left Arm)   Pulse 77   Temp 98.2 F (36.8 C) (Oral)   Resp 17   Ht 5\' 1"  (1.549 m)   Wt 57 kg (125 lb 10.6 oz)   LMP  (LMP Unknown)   SpO2 94%   BMI 23.74 kg/m  Constitutional: She appears well-developed and well-nourished.  HENT: Normocephalic and atraumatic.  Eyes: EOM are normal. No discharge.  Cardiovascular: RRR. No JVD. Respiratory: Slightly increased WOB.  Clear.  GI: Bowel sounds are normal. Nondistended.  Musculoskeletal: No edema or tenderness in extremities  Neurological:  Lethargic.  Follows commands. Motor: RUE: 2-/5 proximal to distal with apraxia (stable) RLE: 4--4/5 HF, KE, ADF   Skin: Skin is warm and dry.  Psychiatric: Normal mood. Normal behavior.    Assessment/Plan: 1. Functional deficits secondary to debility which require 3+ hours per day of interdisciplinary therapy in a comprehensive inpatient rehab setting. Physiatrist is providing close team supervision and 24 hour management of active medical problems listed below. Physiatrist and rehab team  continue to assess barriers to discharge/monitor patient progress toward functional and medical goals.  Function:  Bathing Bathing position   Position: Shower  Bathing parts Body parts bathed by patient: Right arm, Left arm, Chest, Abdomen, Front perineal area, Buttocks, Right upper leg, Left upper leg, Right lower leg, Left lower leg, Back Body parts bathed by helper: Left arm, Back  Bathing assist Assist Level: Touching or steadying assistance(Pt > 75%)      Upper Body Dressing/Undressing Upper body dressing   What is the patient wearing?: Pull over shirt/dress     Pull over shirt/dress - Perfomed by patient: Thread/unthread right sleeve, Thread/unthread left sleeve, Put head through opening, Pull shirt over trunk          Upper body assist Assist Level: Supervision or verbal cues      Lower Body Dressing/Undressing Lower body dressing   What is the patient wearing?: Pants, Non-skid slipper socks     Pants- Performed by patient: Thread/unthread right pants leg, Thread/unthread left pants leg, Pull pants up/down   Non-skid slipper socks- Performed by patient: Don/doff right sock, Don/doff left sock       Shoes - Performed by patient: Don/doff right shoe, Don/doff left shoe         TED Hose - Performed by helper: Don/doff right TED hose, Don/doff left TED hose  Lower body assist Assist for lower body dressing: Touching or steadying assistance (Pt > 75%)  Toileting Toileting   Toileting steps completed by patient: Adjust clothing prior to toileting, Performs perineal hygiene, Adjust clothing after toileting Toileting steps completed by helper: Adjust clothing after toileting, Adjust clothing prior to toileting    Toileting assist Assist level: Touching or steadying assistance (Pt.75%)   Transfers Chair/bed transfer   Chair/bed transfer method: Ambulatory Chair/bed transfer assist level: Touching or steadying assistance (Pt > 75%) Chair/bed transfer assistive  device: Librarian, academic     Max distance: 120 ft Assist level: Touching or steadying assistance (Pt > 75%)   Wheelchair   Type: Manual Max wheelchair distance: 150 Assist Level: Supervision or verbal cues  Cognition Comprehension Comprehension assist level: Follows complex conversation/direction with no assist  Expression Expression assist level: Expresses basic 90% of the time/requires cueing < 10% of the time.  Social Interaction Social Interaction assist level: Interacts appropriately with others with medication or extra time (anti-anxiety, antidepressant).  Problem Solving Problem solving assist level: Solves basic 90% of the time/requires cueing < 10% of the time  Memory Memory assist level: Recognizes or recalls 90% of the time/requires cueing < 10% of the time    Medical Problem List and Plan: 1.  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  DVT Prophylaxis/Anticoagulation: Subcutaneous Lovenox 3. Pain Management: Baclofen 5 mg TID,Oxycodone/Ultram as needed 4. Mood:Lexapro 10 mg daily  5. Neuropsych: This patient is capable of making decisions on her own behalf. 6. Skin/Wound Care: Routine skin checks 7. Fluids/Electrolytes/Nutrition: Routine I&Os 8.Hypertension.Coreg 3.125 mg BID,Lasix 40 mg daily   Relatively controlled on 3/11 9. History of tobacco abuse. Provide counseling 10. Hyperlipidemia. Crestor 11. Constipation. Laxative assistance 12. SOB; Resolved   Last CXR on 3/3 reviewed showing atelectasis. Repeat CXR reviewed, showing improvement    ECG reviewed, relatively stable, read pending   DCed continuous pulse ox   Wean supplemental oxygen as tolerated     Improving 13. Leukocytosis   WBCs 10.8 on 3/7   Afebrile   Labs pending 14. Acute blood loss anemia   Hemoglobin 9.5 on 3/7   Labs pending    Continue to monitor 15. Hyponatremia   Sodium 132 on 3/8  Labs pending, will consider d/cing Lasix if continues to trend down   Continue to monitor 16. Hyperglycemia   Elevated on 3/8   Cont to monitor  LOS (Days) 5 A FACE TO FACE EVALUATION WAS PERFORMED  Ray Glacken Lorie Phenix 12/04/2017 8:27 AM

## 2017-12-04 NOTE — Progress Notes (Signed)
Speech Language Pathology Discharge Summary  Patient Details  Name: [name] MRN: [id number] Date of Birth: [birth year]  Today's Date: 12/04/2017 SLP Individual Time: 0830-0900 SLP Individual Time Calculation (min): 30 min   Skilled Therapeutic Interventions:  Skilled ST services focused on swallow, cogntive and education skills. SLP facilitated PO consumption of regular textured foods and thin liquids demonstrating no overt s/s aspiration and utilizing liquid wash when needed at Mod I. SLP facilitated recall of daily events utilizing therapy schedule, pt required supervision A question cues to use compensatory strategies. SLP facilitated selective attention during 30 minute treatment session and topic maintain with supervision A verbal/question cues. SLP instructed pt in recall, attention and safe swallowing strategies, pt returned verbal recall of strategies and stated understanding. Pt states she is the same cognitively piror to recent health episode and SLP agrees based on piror knowledge of patient from previous admission. Pt stated all question were answered and agree to discharge from further skilled St services at this time. Pt was left in room with call bell within reach.      Patient has met 6 of 6 long term goals.  Patient to discharge at overall Supervision level.  Reasons goals not met:     Clinical Impression/Discharge Summary:   Pt demonstrated great progress meeting 6 out 6 goals and discharging from skilled ST services at supervision/mod I level which is consistent with piror baseline skills. Pt will continue to require supervision assistance in in recall strategies, topic maintainance and selective attention, however is Mod I with swallow ability. Pt would no longer continue to benefit from skilled ST services in order to maximize functional independence and reduce burden of care due to piror level of supervision/mod I.   Care Partner:  Caregiver Able to Provide  Assistance: Yes  Type of Caregiver Assistance: Cognitive  Recommendation:  24 hour supervision/assistance      Equipment:     Reasons for discharge: Treatment goals met   Patient/Family Agrees with Progress Made and Goals Achieved: Yes   Function:  Eating Eating   Modified Consistency Diet: No Eating Assist Level: Set up assist for   Eating Set Up Assist For: Opening containers       Cognition Comprehension Comprehension assist level: Follows complex conversation/direction with no assist  Expression   Expression assist level: Expresses basic 90% of the time/requires cueing < 10% of the time.  Social Interaction Social Interaction assist level: Interacts appropriately with others with medication or extra time (anti-anxiety, antidepressant).  Problem Solving Problem solving assist level: Solves basic 90% of the time/requires cueing < 10% of the time  Memory Memory assist level: Recognizes or recalls 90% of the time/requires cueing < 10% of the time   [name]  [facility] 12/04/2017, 2:37 PM

## 2017-12-04 NOTE — Progress Notes (Signed)
Occupational Therapy Session Note  Patient Details  Name: [name] MRN: [id number] Date of Birth: 12-23-1960  Today's Date: 12/04/2017 OT Individual Time: 5189-8421 OT Individual Time Calculation (min): 60 min    Short Term Goals: Week 1:  OT Short Term Goal 1 (Week 1): STGs = LTGs due to ELOS  Skilled Therapeutic Interventions/Progress Updates:    Pt seen for skilled OT intervention of ADL training with a focus on RUE NMR and dynamic balance. Pt sitting in w/c upon entry to room. Pt ambulated to bathroom with cane and (S) with vc provided for safety navigating in small spaces/around obstacles. Pt completed toileting with standard commode with good sitting balance and performing seated level peri-hygiene with (S). Min A provided for doffing ted hose distally d/t pt restricted by sternal precautions. Pt transferred to shower bench with HHA. Pt completed UB and LB bathing while seated, with intermittent vc provided for hemi-bathing techniques and paretic arm management. Pt transferred from shower to EOB with cane and HHA d/t slippery floor. Discussion with pt re d/c planning, use of A/E at home and during ADL routine. Pt sat EOB and demonstrated HEP with R UE. Education and discussion with pt re NMR and functional use of R UE during everyday activities. Pt completed standing level oral hygiene at sink with education provided re safety awareness at home and use of cane/w/c for rest breaks and activity pacing. Pt then taken to therapy gym and transferred to mat with (S), with vc required for w/c management/safety. Pt completed functional reaching task from seated level with grading to promote core engagement and weight shifting, as well as weight bearing through paretic R UE. Manual cues provided at distal R UE to increase weight through shoulder, as well as to provide tactile input. Pt completed R UE graded exercises to promote NMR, with tactile input provided at bicep and tricep insertion site to  increase muscle firing. Pt demonstrated good carryover of therapy concepts, including sternal precautions and HEP.  Pt taken back to room with chair alarm on and all needs met.   Therapy Documentation Precautions:  Precautions Precautions: Fall, Sternal, Other (comment) Precaution Comments: Verbally reviewed sternal precautions; residual R-side weakness Restrictions Weight Bearing Restrictions: No RUE Weight Bearing: (sternal precaution) LUE Weight Bearing: (sternal precaution) Other Position/Activity Restrictions: sternal precautions.   Pain: Pain Assessment Pain Assessment: No/denies pain Pain Score: 0-No pain ADL: ADL ADL Comments: Refer to functional navigator  See Function Navigator for Current Functional Status.   Therapy/Group: Individual Therapy  [place] 12/04/2017, 12:33 PM

## 2017-12-04 NOTE — Telephone Encounter (Signed)
Spoke with patients husband per DPR. Walked him through how to send a remote transmission and requested that he send one today. He verbalized understanding and will call back if he has issues sending a remote transmission.

## 2017-12-04 NOTE — Plan of Care (Signed)
  Progressing Consults RH GENERAL PATIENT EDUCATION Description See Patient Education module for education specifics. 12/04/2017 1436 - Progressing by Glean Salen, RN RH SKIN INTEGRITY RH STG SKIN FREE OF INFECTION/BREAKDOWN Description With min assist  12/04/2017 1436 - Progressing by Glean Salen, RN RH STG MAINTAIN SKIN INTEGRITY WITH ASSISTANCE Description STG Maintain Skin Integrity With min Assistance.  12/04/2017 1436 - Progressing by Glean Salen, RN Flowsheets Taken 12/04/2017 1436  STG: Maintain skin integrity with assistance 5-Supervision/cueing RH PAIN MANAGEMENT RH STG PAIN MANAGED AT OR BELOW PT'S PAIN GOAL Description At or below level 4  12/04/2017 1436 - Progressing by Glean Salen, RN RH KNOWLEDGE DEFICIT GENERAL RH STG INCREASE KNOWLEDGE OF SELF CARE AFTER HOSPITALIZATION Description Pt will be able to direct care at discharge using cues/reminders/resources provided independently to direct caregivers to assist  12/04/2017 1436 - Progressing by Glean Salen, RN

## 2017-12-05 ENCOUNTER — Inpatient Hospital Stay (HOSPITAL_COMMUNITY): Payer: BLUE CROSS/BLUE SHIELD | Admitting: Occupational Therapy

## 2017-12-05 ENCOUNTER — Inpatient Hospital Stay (HOSPITAL_COMMUNITY): Payer: BLUE CROSS/BLUE SHIELD

## 2017-12-05 ENCOUNTER — Other Ambulatory Visit: Payer: Self-pay

## 2017-12-05 NOTE — Progress Notes (Signed)
Physical Therapy Session Note  Patient Details  Name: [name] MRN: [id number] Date of Birth: July 26, 1961  Today's Date: 12/05/2017 PT Individual Time: 5320-2334, 3568-6168 PT Individual Time Calculation (min): 59 min and 42 min   Short Term Goals: Week 1:  PT Short Term Goal 1 (Week 1): STG=LTG  Skilled Therapeutic Interventions/Progress Updates:    Pt seated in w/c upon PT arrival, agreeable to therapy tx and denies pain. Pt propelled w/c from room>gym x 150 ft with R LE and supervision. Pt ambulated x 100 ft with QC and min assist, working on endurance. Pt ambulated to rehab apartment and worked on ambulating around furniture and sitting on recliner. Pt ambulated back to gym with min assist and QC. Pt standing on blue wedge 2 x 1 min for heel cord stretch, tactile cues for knee extension for increased hamstring stretch. Pt ambulated to mat and transferred to supine. Therapist performed R hip flexor and quad stretch off the edge of the mat, 2 x 1 min. Pt in supine performed gravity eliminated elbow flexion/extension AROM x8 for neuro re-ed. Pt performed 2 x 10 bridges in supine for hip extensor strengthening and neuro re-ed. Pt transferred back to sitting with min assist and ambulated back to room with QC and min assist x150 ft. Pt left seated in w/c with needs in reach, chair alarm set.   Session 2: Pt seated in w/c upon PT arrival, agreeable to therapy tx and denies pain. Pt transported to gym in w/c. Pt ambulated x 80 ft with QC and min assist working on endurance and sequencing with QC for 2 point gait pattern. Pt transferred to tall kneeling on mat for neuro re-ed, in tall kneeling pt performed 2 x 10 hip/knee flexion<>extension for glute strengthening. Pt worked on dynamic balance in tall kneeling while reaching. Pt ambulated from gym>BI gym x 100 ft with QC and min assist. Pt worked on dynamic balance while standing on foam using dynavision while reaching, x 2 trials. Pt transported  back to room and left seated with needs in reach, in care of husband.   Therapy Documentation Precautions:  Precautions Precautions: Fall, Sternal, Other (comment) Precaution Comments: Verbally reviewed sternal precautions; residual R-side weakness Restrictions Weight Bearing Restrictions: No RUE Weight Bearing: (sternal precautions) LUE Weight Bearing: (sternal precaution) Other Position/Activity Restrictions: sternal precautions.     See Function Navigator for Current Functional Status.   Therapy/Group: Individual Therapy  Drema Dallas Schagen,PT, DPT 12/05/2017, 7:23 AM

## 2017-12-05 NOTE — Progress Notes (Signed)
Occupational Therapy Note  Patient Details  Name: [name] MRN: [id number] Date of Birth: 1960/12/05  Today's Date: 12/05/2017 OT Individual Time: 1000-1030 OT Individual Time Calculation (min): 30 min   No c/o pain today. Pt received in w/c already dressed and ready for the day.  Pt taken to gym for RUE NMR and general activity tolerance exercises.  Pt sat on mat and worked on activities to facilitate wrist extension. Pt has slight (5 degrees) of wrist extension against gravity. Worked with guided movements with pt holding cup of elb flex/ ext with bringing cup to mouth.  Pt also talked about her splint and that she noticed color change in her finger tips.  Will speak to her next therapist to assess during his OT session.  Pt returned to her room with all needs met.     [place] 12/05/2017, 12:19 PM

## 2017-12-05 NOTE — Progress Notes (Signed)
Occupational Therapy Session Note  Patient Details  Name: [name] MRN: [id number] Date of Birth: 28-May-1961  Today's Date: 12/05/2017 OT Individual Time: 0569-7948 OT Individual Time Calculation (min): 61 min    Short Term Goals: Week 1:  OT Short Term Goal 1 (Week 1): STGs = LTGs due to ELOS  Skilled Therapeutic Interventions/Progress Updates:    Pt coming out of the bathroom to start session with spouse supervising.  Once to the wheelchair had pt donn resting hand splint on the RUE to check fit.  Issued new stockinet for use under the splint and educated pt to continue wearing it as it fits well.  Therapist encouraged more night time wear and less daytime wear as pt does exhibit some trace digit flexion and would benefit from more integrated use during the day.  Next had pt ambulate with min guard assist down to the therapy gym.  Applied NMES to right dorsal forearm for activation of digit extensors.  Intensity set on level 30 for 10 mins.  Pulse width 300 and duration at 50 pps.  Also had pt work on sides sitting transitions to sitting with use of the RUE and min facilitation.  Stressed the importance of not leading with the head when attempting to return to upright sitting, and to focus on pushing with the RUE.  Finished session with ambulation back to the room with use of the quad can and min guard assist.  Pt left in wheelchair with call button and phone in reach.     Therapy Documentation Precautions:  Precautions Precautions: Fall, Sternal, Other (comment) Precaution Comments: Verbally reviewed sternal precautions; residual R-side weakness Restrictions Weight Bearing Restrictions: No RUE Weight Bearing: (sternal precautions) LUE Weight Bearing: (sternal precaution) Other Position/Activity Restrictions: sternal precautions.   Pain: Pain Assessment Pain Assessment: No/denies pain ADL: See Function Navigator for Current Functional Status.   Therapy/Group: Individual  Therapy  Teairra Millar OTR/L 12/05/2017, 3:55 PM

## 2017-12-05 NOTE — Progress Notes (Signed)
[facility]     PROGRESS NOTE  Subjective/Complaints:  Pt seen sitting up in her chair this AM.  She states she slept well overnight and feels better now that she is putting on make up.  She states she was able to elbow flex yesterday and is happy about that.   ROS: Denies CP, nausea, vomiting, diarrhea.  Objective: Vital Signs: Blood pressure (!) 107/55, pulse 71, temperature 98.4 F (36.9 C), temperature source Oral, resp. rate 16, height 5\' 1"  (1.549 m), weight 57.2 kg (126 lb 1.7 oz), SpO2 93 %. No results found. Recent Labs    12/04/17 1209  WBC 11.9*  HGB 9.3*  HCT 30.1*  PLT 542*   Recent Labs    12/04/17 1209  NA 135  K 4.6  CL 99*  GLUCOSE 107*  BUN 20  CREATININE 0.70  CALCIUM 8.9   CBG (last 3)  No results for input(s): GLUCAP in the last 72 hours.  Wt Readings from Last 3 Encounters:  12/05/17 57.2 kg (126 lb 1.7 oz)  11/29/17 60.5 kg (133 lb 6.1 oz)  11/10/17 58.5 kg (129 lb)    Physical Exam:  BP (!) 107/55 (BP Location: Left Arm)   Pulse 71   Temp 98.4 F (36.9 C) (Oral)   Resp 16   Ht 5\' 1"  (1.549 m)   Wt 57.2 kg (126 lb 1.7 oz)   LMP  (LMP Unknown)   SpO2 93%   BMI 23.83 kg/m  Constitutional: She appears well-developed and well-nourished.  HENT: Normocephalic and atraumatic.  Eyes: EOM are normal. No discharge.  Cardiovascular: RRR. No JVD. Respiratory: Slightly increased WOB.  Clear.  GI: Bowel sounds are normal. Nondistended.  Musculoskeletal: No edema or tenderness in extremities  Neurological:  Lethargic.  Follows commands. Motor: RUE: 2-/5 proximal to distal with apraxia (unchanged) RLE: 4--4/5 HF, KE, ADF   Skin: Skin is warm and dry.  Psychiatric: Normal mood. Normal behavior.    Assessment/Plan: 1. Functional deficits secondary to debility which require 3+ hours per day of interdisciplinary therapy in a comprehensive inpatient rehab setting. Physiatrist is providing close team supervision  and 24 hour management of active medical problems listed below. Physiatrist and rehab team continue to assess barriers to discharge/monitor patient progress toward functional and medical goals.  Function:  Bathing Bathing position   Position: Shower  Bathing parts Body parts bathed by patient: Right arm, Left arm, Chest, Abdomen, Front perineal area, Buttocks, Right upper leg, Left upper leg, Right lower leg, Left lower leg, Back Body parts bathed by helper: Left arm, Back  Bathing assist Assist Level: Supervision or verbal cues      Upper Body Dressing/Undressing Upper body dressing   What is the patient wearing?: Pull over shirt/dress     Pull over shirt/dress - Perfomed by patient: Thread/unthread right sleeve, Thread/unthread left sleeve, Put head through opening, Pull shirt over trunk          Upper body assist Assist Level: Supervision or verbal cues      Lower Body Dressing/Undressing Lower body dressing   What is the patient wearing?: Pants, Non-skid slipper socks     Pants- Performed by patient: Thread/unthread right pants leg, Thread/unthread left pants leg, Pull pants up/down   Non-skid slipper socks- Performed by patient: Don/doff right sock, Don/doff left sock       Shoes - Performed by patient: Don/doff right shoe, Don/doff left shoe         TED  Hose - Performed by helper: Don/doff right TED hose, Don/doff left TED hose  Lower body assist Assist for lower body dressing: Touching or steadying assistance (Pt > 75%)      Toileting Toileting   Toileting steps completed by patient: Adjust clothing prior to toileting, Performs perineal hygiene, Adjust clothing after toileting Toileting steps completed by helper: Adjust clothing after toileting, Adjust clothing prior to toileting Toileting Assistive Devices: Other (comment)(quad cane)  Toileting assist Assist level: Supervision or verbal cues   Transfers Chair/bed transfer   Chair/bed transfer method:  Ambulatory Chair/bed transfer assist level: Touching or steadying assistance (Pt > 75%) Chair/bed transfer assistive device: Librarian, academic     Max distance: 98 ft Assist level: Touching or steadying assistance (Pt > 75%)   Wheelchair   Type: Manual Max wheelchair distance: 150 Assist Level: Supervision or verbal cues  Cognition Comprehension Comprehension assist level: Follows complex conversation/direction with no assist  Expression Expression assist level: Expresses basic 90% of the time/requires cueing < 10% of the time.  Social Interaction Social Interaction assist level: Interacts appropriately with others with medication or extra time (anti-anxiety, antidepressant).  Problem Solving Problem solving assist level: Solves basic 90% of the time/requires cueing < 10% of the time  Memory Memory assist level: Recognizes or recalls 90% of the time/requires cueing < 10% of the time    Medical Problem List and Plan: 1.  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  DVT Prophylaxis/Anticoagulation: Subcutaneous Lovenox 3. Pain Management: Baclofen 5 mg TID,Oxycodone/Ultram as needed 4. Mood:Lexapro 10 mg daily  5. Neuropsych: This patient is capable of making decisions on her own behalf. 6. Skin/Wound Care: Routine skin checks 7. Fluids/Electrolytes/Nutrition: Routine I&Os 8.Hypertension.Coreg 3.125 mg BID,Lasix 40 mg daily   Relatively controlled on 3/12 9. History of tobacco abuse. Provide counseling 10. Hyperlipidemia. Crestor 11. Constipation. Laxative assistance 12. SOB: Resolved   Last CXR on 3/3 reviewed showing atelectasis. Repeat CXR reviewed, showing improvement    ECG reviewed, relatively stable, read pending   DCed continuous pulse ox   Wean supplemental oxygen as tolerated     Improving 13. Leukocytosis   WBCs  11.9 on 3/11   Afebrile   UA ordered 14. Acute blood loss anemia   Hemoglobin 9.3 on 3/11   Continue to monitor 15. Hyponatremia: Resolved   Sodium 135 on 3/11  Continue to monitor 16. Hyperglycemia   Mildly elevated on 3/11   Cont to monitor  LOS (Days) 6 A FACE TO FACE EVALUATION WAS PERFORMED  Ankit Lorie Phenix 12/05/2017 9:25 AM

## 2017-12-06 ENCOUNTER — Inpatient Hospital Stay (HOSPITAL_COMMUNITY): Payer: BLUE CROSS/BLUE SHIELD | Admitting: Physical Therapy

## 2017-12-06 ENCOUNTER — Inpatient Hospital Stay (HOSPITAL_COMMUNITY): Payer: BLUE CROSS/BLUE SHIELD

## 2017-12-06 ENCOUNTER — Encounter: Payer: BLUE CROSS/BLUE SHIELD | Admitting: Occupational Therapy

## 2017-12-06 ENCOUNTER — Inpatient Hospital Stay (HOSPITAL_COMMUNITY): Payer: BLUE CROSS/BLUE SHIELD | Admitting: *Deleted

## 2017-12-06 ENCOUNTER — Inpatient Hospital Stay (HOSPITAL_COMMUNITY): Payer: BLUE CROSS/BLUE SHIELD | Admitting: Occupational Therapy

## 2017-12-06 ENCOUNTER — Ambulatory Visit: Payer: BLUE CROSS/BLUE SHIELD | Admitting: Physical Therapy

## 2017-12-06 DIAGNOSIS — I952 Hypotension due to drugs: Secondary | ICD-10-CM

## 2017-12-06 LAB — URINALYSIS, COMPLETE (UACMP) WITH MICROSCOPIC
BILIRUBIN URINE: NEGATIVE
Glucose, UA: NEGATIVE mg/dL
KETONES UR: NEGATIVE mg/dL
LEUKOCYTES UA: NEGATIVE
NITRITE: NEGATIVE
Protein, ur: NEGATIVE mg/dL
SPECIFIC GRAVITY, URINE: 1.015 (ref 1.005–1.030)
pH: 5 (ref 5.0–8.0)

## 2017-12-06 LAB — CREATININE, SERUM: Creatinine, Ser: 0.76 mg/dL (ref 0.44–1.00)

## 2017-12-06 MED ORDER — FUROSEMIDE 20 MG PO TABS
20.0000 mg | ORAL_TABLET | Freq: Every day | ORAL | Status: DC
  Administered 2017-12-06 – 2017-12-08 (×3): 20 mg via ORAL
  Filled 2017-12-06 (×2): qty 1

## 2017-12-06 NOTE — Therapy (Signed)
[facility] [address] [place], [place], [address] Phone: [phone]   Fax:  [phone]  Patient Details  Name: LATAYA VARNELL MRN: [id number] Date of Birth: July 10, 1961 Referring Provider:  Dr. Howard Pouch Encounter Date: 12/06/2017  OCCUPATIONAL THERAPY DISCHARGE SUMMARY  Visits from Start of Care: 7  Current functional level related to goals / functional outcomes: Pt did not meet any STG's/LTG's secondary to not returning after 7th visit on 11/15/17 due to hospitalization for pneumonia   Remaining deficits: Same as initial evaluation   Education / Equipment: Initial HEP  Plan: Patient agrees to discharge.  Patient goals were not met. Patient is being discharged due to a change in medical status.  ?????       Carey Bullocks, OTR/L 12/06/2017, 8:41 AM  [facility] [address] [place], [place], [address] Phone: [phone]   Fax:  [phone]

## 2017-12-06 NOTE — Progress Notes (Signed)
Physical Therapy Session Note  Patient Details  Name: [name] MRN: [id number] Date of Birth: [birth year]  Today's Date: 12/06/2017 PT Individual Time: 0905-1000 PT Individual Time Calculation (min): 55 min   Short Term Goals: Week 1:  PT Short Term Goal 1 (Week 1): STG=LTG  Skilled Therapeutic Interventions/Progress Updates:   Pt in w/c and agreeable to therapy, no c/o pain. Pt self-propelled w/c to gym w/ supervision using RLE. Worked on endurance w/ functional mobility this session. Ambulated 75' x4, 150' x2 w/ supervision using quad cane. Negotiated 4 steps w/ quad cane only w/ min guard and verbal cues for technique. Emphasis on breathing strategies and discussed energy conservation strategies after a cardiothoracic surgery while ambulating and during rest breaks. Pt appreciative of education and had no signs or symptoms of desat while on room air throughout session. She did require frequent brief seated rest breaks. Returned to room and ended session in w/c, call bell within reach and all needs met.   Therapy Documentation Precautions:  Precautions Precautions: Fall, Sternal, Other (comment) Precaution Comments: Verbally reviewed sternal precautions; residual R-side weakness Restrictions Weight Bearing Restrictions: No RUE Weight Bearing: (sternal precautions) LUE Weight Bearing: (sternal precaution) Other Position/Activity Restrictions: sternal precautions.   Vital Signs: Therapy Vitals Temp: 97.7 F (36.5 C) Temp Source: Oral Pulse Rate: 68 BP: (!) 107/51 Patient Position (if appropriate): Sitting Oxygen Therapy SpO2: 94 % O2 Device: Room Air Pain: Pain Assessment Pain Assessment: 0-10 Pain Score: 3  Pain Type: Acute pain Pain Location: Chest Pain Orientation: Right;Left;Mid Pain Descriptors / Indicators: Tender Pain Frequency: Constant Pain Onset: On-going Patients Stated Pain Goal: 1 Pain Intervention(s): Medication (See eMAR)  See Function Navigator  for Current Functional Status.   Therapy/Group: Individual Therapy  [name] K Arnette 12/06/2017, 5:04 PM

## 2017-12-06 NOTE — Progress Notes (Signed)
Social Work Patient ID: [name], female   DOB: [birth year], 57 y.o.   MRN: [id number]  Met with pt and husband to discuss team conference goals supervision and target discharge 3/15. Both pleased with her progress and feel ready to go home. Pt wants to go back to Neuro OP will make referral. Has all equipment from last time.

## 2017-12-06 NOTE — Progress Notes (Signed)
Occupational Therapy Session Note  Patient Details  Name: [name] MRN: [id number] Date of Birth: 01/11/1961  Today's Date: 12/06/2017 OT Individual Time: 8110-3159 OT Individual Time Calculation (min): 56 min    Short Term Goals: Week 1:  OT Short Term Goal 1 (Week 1): STGs = LTGs due to ELOS  Skilled Therapeutic Interventions/Progress Updates:    Pt completed toileting, bathing, and dressing sit to stand during session.  He was able to complete toilet transfer with supervision and then she transitioned to the tub shower bench with supervision as well.  She completed bathing with min assist sitting on the seat, needing assist with washing her back and her LUE.  Dressing completed from wheelchair level sit to stand with supervision once shower was finished.  Pt left in the wheelchair with call button and phone in reach.    Therapy Documentation Precautions:  Precautions Precautions: Fall, Sternal Precaution Comments: Verbally reviewed sternal precautions; residual R-side weakness Restrictions Weight Bearing Restrictions: No RUE Weight Bearing: (sternal precautions) LUE Weight Bearing: (sternal precaution) Other Position/Activity Restrictions: sternal precautions.    Pain: Pain Assessment Pain Assessment: 0-10 Pain Score: 3  Faces Pain Scale: Hurts a little bit Pain Type: Acute pain Pain Location: Chest Pain Orientation: Right;Left;Mid Pain Descriptors / Indicators: Tender Pain Frequency: Constant Pain Onset: On-going Patients Stated Pain Goal: 1 Pain Intervention(s): Medication (See eMAR) ADL: See Function Navigator for Current Functional Status.   Therapy/Group: Individual Therapy  [name] OTR/L 12/06/2017, 5:14 PM

## 2017-12-06 NOTE — Progress Notes (Signed)
Recreational Therapy Session Note  Patient Details  Name: [name] MRN: [id number] Date of Birth: 02/06/1961 Today's Date: 12/06/2017  Pain: no c/o Skilled Therapeutic Interventions/Progress Updates: Full eval deferred as pt is set to discharge home on 3/15.  Reviewed importance of staying active and introduced activity analysis with potential adaptations.  Pt stated understanding.   [place] 12/06/2017, 4:37 PM

## 2017-12-06 NOTE — Progress Notes (Signed)
[facility] PHYSICAL MEDICINE & REHABILITATION     PROGRESS NOTE  Subjective/Complaints:  Patient seen lying in bed this morning. She states she did not sleep well overnight because she received her medications late.  ROS: Denies CP, nausea, vomiting, diarrhea.  Objective: Vital Signs: Blood pressure (!) 106/57, pulse 67, temperature 97.7 F (36.5 C), temperature source Oral, resp. rate 18, height 5\' 1"  (1.549 m), weight 57.1 kg (125 lb 14.4 oz), SpO2 98 %. No results found. Recent Labs    12/04/17 1209  WBC 11.9*  HGB 9.3*  HCT 30.1*  PLT 542*   Recent Labs    12/04/17 1209 12/06/17 0545  NA 135  --   K 4.6  --   CL 99*  --   GLUCOSE 107*  --   BUN 20  --   CREATININE 0.70 0.76  CALCIUM 8.9  --    CBG (last 3)  No results for input(s): GLUCAP in the last 72 hours.  Wt Readings from Last 3 Encounters:  12/06/17 57.1 kg (125 lb 14.4 oz)  11/29/17 60.5 kg (133 lb 6.1 oz)  11/10/17 58.5 kg (129 lb)    Physical Exam:  BP (!) 106/57 (BP Location: Left Arm)   Pulse 67   Temp 97.7 F (36.5 C) (Oral)   Resp 18   Ht 5\' 1"  (1.549 m)   Wt 57.1 kg (125 lb 14.4 oz)   LMP  (LMP Unknown)   SpO2 98%   BMI 23.79 kg/m  Constitutional: She appears well-developed and well-nourished.  HENT: Normocephalic and atraumatic.  Eyes: EOM are normal. No discharge.  Cardiovascular: RRR. No JVD. Respiratory: Unlabored.  Clear.  GI: Bowel sounds are normal. Nondistended.  Musculoskeletal: No edema or tenderness in extremities  Neurological:  Lethargic.  Follows commands. Motor: RUE: 2-/5 proximal to distal with apraxia (stable) RLE: 4--4/5 HF, KE, ADF   Skin: Skin is warm and dry.  Psychiatric: Normal mood. Normal behavior.    Assessment/Plan: 1. Functional deficits secondary to debility which require 3+ hours per day of interdisciplinary therapy in a comprehensive inpatient rehab setting. Physiatrist is providing close team supervision and 24 hour management of active medical  problems listed below. Physiatrist and rehab team continue to assess barriers to discharge/monitor patient progress toward functional and medical goals.  Function:  Bathing Bathing position   Position: Shower  Bathing parts Body parts bathed by patient: Right arm, Left arm, Chest, Abdomen, Front perineal area, Buttocks, Right upper leg, Left upper leg, Right lower leg, Left lower leg, Back Body parts bathed by helper: Left arm, Back  Bathing assist Assist Level: Supervision or verbal cues      Upper Body Dressing/Undressing Upper body dressing   What is the patient wearing?: Pull over shirt/dress     Pull over shirt/dress - Perfomed by patient: Thread/unthread right sleeve, Thread/unthread left sleeve, Put head through opening, Pull shirt over trunk          Upper body assist Assist Level: Supervision or verbal cues      Lower Body Dressing/Undressing Lower body dressing   What is the patient wearing?: Pants, Non-skid slipper socks     Pants- Performed by patient: Thread/unthread right pants leg, Thread/unthread left pants leg, Pull pants up/down   Non-skid slipper socks- Performed by patient: Don/doff right sock, Don/doff left sock       Shoes - Performed by patient: Don/doff right shoe, Don/doff left shoe         TED Hose - Performed  by helper: Don/doff right TED hose, Don/doff left TED hose  Lower body assist Assist for lower body dressing: Touching or steadying assistance (Pt > 75%)      Toileting Toileting   Toileting steps completed by patient: Adjust clothing prior to toileting, Performs perineal hygiene, Adjust clothing after toileting Toileting steps completed by helper: Adjust clothing prior to toileting, Performs perineal hygiene, Adjust clothing after toileting Toileting Assistive Devices: Grab bar or rail  Toileting assist Assist level: Touching or steadying assistance (Pt.75%)   Transfers Chair/bed transfer   Chair/bed transfer method:  Ambulatory Chair/bed transfer assist level: Touching or steadying assistance (Pt > 75%) Chair/bed transfer assistive device: Librarian, academic     Max distance: 150 ft Assist level: Touching or steadying assistance (Pt > 75%)   Wheelchair   Type: Manual Max wheelchair distance: 150 Assist Level: Supervision or verbal cues  Cognition Comprehension Comprehension assist level: Follows complex conversation/direction with no assist  Expression Expression assist level: Expresses basic 90% of the time/requires cueing < 10% of the time.  Social Interaction Social Interaction assist level: Interacts appropriately with others with medication or extra time (anti-anxiety, antidepressant).  Problem Solving Problem solving assist level: Solves basic 90% of the time/requires cueing < 10% of the time  Memory Memory assist level: Recognizes or recalls 90% of the time/requires cueing < 10% of the time    Medical Problem List and Plan: 1.  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  DVT Prophylaxis/Anticoagulation: Subcutaneous Lovenox 3. Pain Management: Baclofen 5 mg TID,Oxycodone/Ultram as needed 4. Mood:Lexapro 10 mg daily  5. Neuropsych: This patient is capable of making decisions on her own behalf. 6. Skin/Wound Care: Routine skin checks 7. Fluids/Electrolytes/Nutrition: Routine I&Os   Cr. WNL on 3/13 8.Hypertension.Coreg 3.125 mg BID   Lasix 40 mg daily, decreased to 20mg  on 3/13    Relatively controlled on 3/13 9. History of tobacco abuse. Provide counseling 10. Hyperlipidemia. Crestor 11. Constipation. Laxative assistance 12. SOB: Resolved   Last CXR on 3/3 reviewed showing atelectasis. Repeat CXR reviewed, showing improvement    ECG reviewed, relatively stable, read pending   DCed continuous pulse ox   Wean supplemental oxygen as  tolerated     Improving 13. Leukocytosis   WBCs 11.9 on 3/11   Afebrile   UA ordered 14. Acute blood loss anemia   Hemoglobin 9.3 on 3/11   Continue to monitor 15. Hyponatremia: Resolved   Sodium 135 on 3/11  Continue to monitor 16. Hyperglycemia   Mildly elevated on 3/11   Cont to monitor  LOS (Days) 7 A FACE TO FACE EVALUATION WAS PERFORMED  Ivry Pigue Lorie Phenix 12/06/2017 8:31 AM

## 2017-12-06 NOTE — Progress Notes (Signed)
Physical Therapy Note  Patient Details  Name: [name] MRN: [id number] Date of Birth: February 28, 1961 Today's Date: 12/06/2017  1050-1205, 75 min individual tx Pain: none per pt  Stand pivot w/c> mat to R without AD, iwht min guard assist, x 2.    neuromuscular re-education via demo, multimodal cues for seated pelvic dissociation via reciprocal scooting, (pt with limited r pelvic protraction/retraction), bil shoulder adduction, R heel taps onto floor target to isolate R hip flexors/ext and knee ext/flex. Pt noted to hold her breath during effortful movements.  PT instructed pt in diaphragmatic breathing, with improvement with practice, and counting aloud during exs  Therapeutic activity in biased -to- R standing during bil hand activity on table in front , using R hand grip gross assist to manipulate Rx bottles lids off/on.   PT donned gloves on pt, and she used bil hands to clean Rx bottles after activity, in sitting.  Gait training iwht grocery cart x 100' iwht min guard assist, to increase fluidity of gait.  Pt was able to hold R hand on bar of cart wihtout assistance.  Pt left resting in w/c with alarm set and all needs within reach..  See function navigator for current status.  [name] 12/06/2017, 8:56 AM

## 2017-12-06 NOTE — Patient Care Conference (Signed)
Inpatient RehabilitationTeam Conference and Plan of Care Update Date: 12/06/2017   Time: 2:00 PM    Patient Name: [name]      Medical Record Number: [id number]  Date of Birth: 02/07/1961 Sex: Female         Room/Bed: 4M06C/4M06C-01 Payor Info: Payor: [place] / Plan: BCBS OTHER / Product Type: *No Product type* /    Admitting Diagnosis: Debility  Admit Date/Time:  11/29/2017  2:10 PM Admission Comments: No comment available   Primary Diagnosis:  <principal problem not specified> Principal Problem: <principal problem not specified>  Patient Active Problem List   Diagnosis Date Noted  . Hypotension due to drugs   . Labile blood pressure   . Shortness of breath   . Essential hypertension   . Leukocytosis   . Hyponatremia   . Debility 11/29/2017  . Benign essential HTN   . Anxiety state   . Muscle spasm   . Community acquired pneumonia of right lower lobe of lung ([place])   . Hx of CABG   . Acute blood loss anemia   . Tachycardia   . Post-operative pain   . Diastolic dysfunction   . Tobacco abuse   . History of CVA with residual deficit   . Hyperglycemia   . Supplemental oxygen dependent   . 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 Coronary artery disease 11/23/2017  . Acute on chronic systolic heart failure ([place]) 11/21/2017  . Acute respiratory failure with hypoxia ([place]) 11/16/2017  . Aspiration pneumonia ([place]) 11/16/2017  . Elevated troponin 11/16/2017  . NSTEMI (non-ST elevated myocardial infarction) ([place]) 11/16/2017  . Constipation 11/10/2017  . Stress and adjustment reaction 10/13/2017  . Urticaria 10/13/2017  . Former smoker 10/13/2017  . Hypokalemia   . Cerebrovascular accident (CVA) due to occlusion of left middle cerebral artery ([place])   . Right hemiplegia ([place])   . Dysphagia, post-stroke   . Hyperlipidemia     Expected Discharge Date: Expected Discharge Date: 12/08/17  Team Members Present: Physician leading conference: Dr. 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  Improve WBCs, HTN, ABLA, mobility  See above   Bowel/Bladder   continent of b/b LBM 12/06/17  continent of b/b with min assist normal b/b function  assess b/b q shift treat constipation as needed   Swallow/Nutrition/ Hydration             ADL's   steadying A with self care, pt should be ready for discharge by the end of the week  Supervision overall  balance, activity tolerance, RUE NMR   Mobility   supervision bed mobility, min assist sit<>stand, min assist gait up to 180 ft with QC, stairs min assist with QC without rails  supervision for all mobility  d/c planning, family ed, LE strength, endurance, balance   Communication             Safety/Cognition/ Behavioral Observations            Pain   pt is taking Ultram 50mg  Q6H prn for pain pt will only take oxycontin 5mg  prn at hs feels too "sleepy" when  takes duriing day   pt will have pain that is <=3  assess pt for pain q shift and prn assess for effectiveness   Skin   skin intact without issues  skin intact free of breakdown  assess skin q shift and prn      *See Care Plan and progress notes for long and short-term goals.     Barriers to Discharge  Current Status/Progress Possible Resolutions Date Resolved   Physician    Medical stability     See above  Therapies, follow labs, optimize BP meds      Nursing                  PT                    OT                  SLP                SW                Discharge Planning/Teaching Needs:  HOme with husband and other family members to assist with her care, provided  care prior to admission after CVA in 07/2017      Team Discussion:  Pt doing well in therapies and functioning at supervision level. Pain issues with surgicial pain MD managing with meds. Endurance main issue needs rest breaks. Husband here daily and attends therapies with pt.  Revisions to Treatment Plan:  DC 3/15    Continued Need for Acute Rehabilitation Level of Care: The patient requires daily medical management by a physician with specialized training in physical medicine and rehabilitation for the following conditions: Daily direction of a multidisciplinary physical rehabilitation program to ensure safe treatment while eliciting the highest outcome that is of practical value to the patient.: Yes Daily medical management of patient stability for increased activity during participation in an intensive rehabilitation regime.: Yes Daily analysis of laboratory values and/or radiology reports with any subsequent need for medication adjustment of medical intervention for : Cardiac problems;Post surgical problems;Neurological problems;Blood pressure problems  Elease Hashimoto 12/06/2017, 2:37 PM

## 2017-12-07 ENCOUNTER — Inpatient Hospital Stay (HOSPITAL_COMMUNITY): Payer: BLUE CROSS/BLUE SHIELD

## 2017-12-07 ENCOUNTER — Inpatient Hospital Stay (HOSPITAL_COMMUNITY): Payer: BLUE CROSS/BLUE SHIELD | Admitting: Physical Therapy

## 2017-12-07 ENCOUNTER — Inpatient Hospital Stay (HOSPITAL_COMMUNITY): Payer: BLUE CROSS/BLUE SHIELD | Admitting: Occupational Therapy

## 2017-12-07 ENCOUNTER — Other Ambulatory Visit: Payer: Self-pay

## 2017-12-07 NOTE — Progress Notes (Signed)
[facility] PHYSICAL MEDICINE & REHABILITATION     PROGRESS NOTE  Subjective/Complaints:  Patient seen sitting up at the edge of her bed this morning eating breakfast. She states she slept well overnight. She states she is looking forward to discharge tomorrow but aware that she needs to continue to exercise to make progress.  ROS: Denies CP, nausea, vomiting, diarrhea.  Objective: Vital Signs: Blood pressure 114/66, pulse 76, temperature 98.3 F (36.8 C), temperature source Oral, resp. rate 20, height 5\' 1"  (1.549 m), weight 57.2 kg (126 lb 1.7 oz), SpO2 96 %. No results found. Recent Labs    12/04/17 1209  WBC 11.9*  HGB 9.3*  HCT 30.1*  PLT 542*   Recent Labs    12/04/17 1209 12/06/17 0545  NA 135  --   K 4.6  --   CL 99*  --   GLUCOSE 107*  --   BUN 20  --   CREATININE 0.70 0.76  CALCIUM 8.9  --    CBG (last 3)  No results for input(s): GLUCAP in the last 72 hours.  Wt Readings from Last 3 Encounters:  12/07/17 57.2 kg (126 lb 1.7 oz)  11/29/17 60.5 kg (133 lb 6.1 oz)  11/10/17 58.5 kg (129 lb)    Physical Exam:  BP 114/66 (BP Location: Left Arm)   Pulse 76   Temp 98.3 F (36.8 C) (Oral)   Resp 20   Ht 5\' 1"  (1.549 m)   Wt 57.2 kg (126 lb 1.7 oz)   LMP  (LMP Unknown)   SpO2 96%   BMI 23.83 kg/m  Constitutional: She appears well-developed and well-nourished.  HENT: Normocephalic and atraumatic.  Eyes: EOM are normal. No discharge.  Cardiovascular: RRR. No JVD. Respiratory: Unlabored.  clear.  GI: Bowel sounds are normal. Nondistended.  Musculoskeletal: No edema or tenderness in extremities  Neurological:  Alert. Follows commands. Motor: RUE: 2-/5 proximal to distal with apraxia (unchanged) RLE: 4--4/5 HF, KE, ADF   Skin: Skin is warm and dry.  Psychiatric: Normal mood. Normal behavior.    Assessment/Plan: 1. Functional deficits secondary to debility which require 3+ hours per day of interdisciplinary therapy in a comprehensive inpatient rehab  setting. Physiatrist is providing close team supervision and 24 hour management of active medical problems listed below. Physiatrist and rehab team continue to assess barriers to discharge/monitor patient progress toward functional and medical goals.  Function:  Bathing Bathing position   Position: Shower  Bathing parts Body parts bathed by patient: Right arm, Chest, Abdomen, Front perineal area, Buttocks, Right upper leg, Left upper leg, Right lower leg, Left lower leg Body parts bathed by helper: Left arm, Back  Bathing assist Assist Level: Supervision or verbal cues      Upper Body Dressing/Undressing Upper body dressing   What is the patient wearing?: Pull over shirt/dress     Pull over shirt/dress - Perfomed by patient: Thread/unthread right sleeve, Thread/unthread left sleeve, Put head through opening, Pull shirt over trunk          Upper body assist Assist Level: Supervision or verbal cues      Lower Body Dressing/Undressing Lower body dressing   What is the patient wearing?: Pants, Shoes     Pants- Performed by patient: Thread/unthread right pants leg, Thread/unthread left pants leg, Pull pants up/down   Non-skid slipper socks- Performed by patient: Don/doff right sock, Don/doff left sock       Shoes - Performed by patient: Don/doff right shoe, Don/doff left shoe  TED Hose - Performed by helper: Don/doff right TED hose, Don/doff left TED hose  Lower body assist Assist for lower body dressing: Supervision or verbal cues      Toileting Toileting   Toileting steps completed by patient: Adjust clothing prior to toileting, Performs perineal hygiene, Adjust clothing after toileting Toileting steps completed by helper: Adjust clothing prior to toileting, Performs perineal hygiene, Adjust clothing after toileting Toileting Assistive Devices: Grab bar or rail  Toileting assist Assist level: Supervision or verbal cues   Transfers Chair/bed transfer    Chair/bed transfer method: Ambulatory Chair/bed transfer assist level: Supervision or verbal cues Chair/bed transfer assistive device: Armrests, Librarian, academic     Max distance: 150' Assist level: Supervision or verbal cues   Wheelchair   Type: Manual Max wheelchair distance: 150 Assist Level: Supervision or verbal cues  Cognition Comprehension Comprehension assist level: Follows complex conversation/direction with extra time/assistive device  Expression Expression assist level: Expresses basic needs/ideas: With extra time/assistive device  Social Interaction Social Interaction assist level: Interacts appropriately with others with medication or extra time (anti-anxiety, antidepressant).  Problem Solving Problem solving assist level: Solves basic 90% of the time/requires cueing < 10% of the time  Memory Memory assist level: Recognizes or recalls 90% of the time/requires cueing < 10% of the time    Medical Problem List and Plan: 1.  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  DVT Prophylaxis/Anticoagulation: Subcutaneous Lovenox 3. Pain Management: Baclofen 5 mg TID,Oxycodone/Ultram as needed 4. Mood:Lexapro 10 mg daily  5. Neuropsych: This patient is capable of making decisions on her own behalf. 6. Skin/Wound Care: Routine skin checks 7. Fluids/Electrolytes/Nutrition: Routine I&Os   Cr. WNL on 3/13 8.Hypertension.Coreg 3.125 mg BID   Lasix 40 mg daily, decreased to 20mg  on 3/13    Relatively controlled on 3/14 9. History of tobacco abuse. Provide counseling 10. Hyperlipidemia. Crestor 11. Constipation. Laxative assistance 12. SOB: Resolved   Last CXR on 3/3 reviewed showing atelectasis. Repeat CXR reviewed, showing improvement    ECG reviewed, relatively stable, read pending   DCed continuous  pulse ox   Wean supplemental oxygen as tolerated     Improving 13. Leukocytosis   WBCs 11.9 on 3/11   Afebrile   UA relatively unremarkable 14. Acute blood loss anemia   Hemoglobin 9.3 on 3/11   Continue to monitor 15. Hyponatremia: Resolved   Sodium 135 on 3/11  Continue to monitor 16. Hyperglycemia   Mildly elevated on 3/11   Cont to monitor  LOS (Days) 8 A FACE TO FACE EVALUATION WAS PERFORMED  Ankit Lorie Phenix 12/07/2017 8:25 AM

## 2017-12-07 NOTE — Discharge Summary (Signed)
Discharge summary job [phone]

## 2017-12-07 NOTE — Progress Notes (Signed)
Occupational Therapy Session Note  Patient Details  Name: [name] MRN: [id number] Date of Birth: 1960-11-22  Today's Date: 12/07/2017 OT Individual Time: 0845-1000 OT Individual Time Calculation (min): 75 min    Short Term Goals: Week 1:  OT Short Term Goal 1 (Week 1): STGs = LTGs due to ELOS  Skilled Therapeutic Interventions/Progress Updates:    Pt seen for OT ADL bathing/dressing session and e-stim use. Pt asleep in supine upon arrival, easily awoken and agreeable to tx session. She denied pain this session. She ambulated throughout session using QC with supervision. Gathered clothing items from drawers as well as washclothes/towels from overhead sheld in prep for showering task with guarding assist. She bathed seated on tub bench with distant supervision, using modifed techniques mod I. She dressed seated on BSC, standing with QC and distant supervision to pull pants up. Grooming tasks completed from seated position at sink for energy conservation.  She ambulated to therapy gym with close supervision using QC at decreased speed, however, functional level. Seated EOM, NMES.  1:1 NMES applied to dorsal aspect of R UE to help approximate shoulder joint to reduce sublux and reduce pain.   Ratio 1:3 Rate 35 pps Waveform- Asymmetric Ramp 1.0 Pulse 300 Intensity- 29 Duration -   10 minutes  Observed wrist extension with pulses  No adverse reactions after treatment and is skin intact.   Pt left seated EOM at end of session with e-stim still applied, hand off to PT.    Therapy Documentation Precautions:  Precautions Precautions: Fall, Sternal Precaution Comments: Verbally reviewed sternal precautions; residual R-side weakness Restrictions Weight Bearing Restrictions: No RUE Weight Bearing: (sternal precautions) LUE Weight Bearing: (sternal precaution) Other Position/Activity Restrictions: sternal precautions.   Pain:   No/denies pain ADL: ADL ADL Comments: Refer  to functional navigator  See Function Navigator for Current Functional Status.   Therapy/Group: Individual Therapy  Cornelia Walraven L 12/07/2017, 8:22 AM

## 2017-12-07 NOTE — Discharge Instructions (Signed)
Inpatient Rehab Discharge Instructions  [name] Discharge date and time: No discharge date for patient encounter.   Activities/Precautions/ Functional Status: Activity: Sternal precautions Diet: regular diet Wound Care: keep wound clean and dry Functional status:  ___ No restrictions     ___ Walk up steps independently ___ 24/7 supervision/assistance   ___ Walk up steps with assistance ___ Intermittent supervision/assistance  ___ Bathe/dress independently ___ Walk with walker     __x_ Bathe/dress with assistance ___ Walk Independently    ___ Shower independently ___ Walk with assistance    ___ Shower with assistance ___ No alcohol     ___ Return to work/school ________  Special Instructions: No driving smoking or alcohol   COMMUNITY REFERRALS UPON DISCHARGE:    Outpatient: PT & OT  Agency:[facility] Phone:[phone]   Date of Last Service:12/08/2017  Appointment Date/Time:MARCH 27  10:00-11:45 AM  Medical Equipment/Items Ordered:NO NEEDS HAD FROM PREVIOUS ADMIT    GENERAL COMMUNITY RESOURCES FOR PATIENT/FAMILY: Support Groups:CVA SUPPORT GROUP  EVERY SECOND Thursday @ 3:00-4:00 PM ON THE REHAB UNIT QUESTIONS CONTACT [facility] [phone]  My questions have been answered and I understand these instructions. I will adhere to these goals and the provided educational materials after my discharge from the hospital.  Patient/Caregiver Signature _______________________________ Date __________  Clinician Signature _______________________________________ Date __________  Please bring this form and your medication list with you to all your follow-up doctor's appointments.

## 2017-12-07 NOTE — Progress Notes (Signed)
Social Work  Discharge Note  The overall goal for the admission was met for:   Discharge location: Yes-HOME WITH HUSBAND WHO CAN PROVIDE 24 HR SUPERVISION  Length of Stay: Yes-9 DAYS  Discharge activity level: Yes-SUPERVISION LEVEL  Home/community participation: Yes  Services provided included: MD, RD, PT, OT, RN, CM, Pharmacy and SW  Financial Services: Private Insurance: [place]  Follow-up services arranged: Outpatient: [facility] 3/27 10:00-11:45  Comments (or additional information):HUSBAND HERE DAILY AND WAS ASSISTING WITH HER CARE PRIOR TO ADMISSION AFTER CVA-07/2017. PREFER OP THERAPIES WAS THERE PRIOR TO ADMISSION TO [place]  Patient/Family verbalized understanding of follow-up arrangements: Yes  Individual responsible for coordination of the follow-up plan: BOBBY-HUSBAND  Confirmed correct DME delivered: Elease Hashimoto 12/07/2017    Elease Hashimoto

## 2017-12-07 NOTE — Progress Notes (Signed)
Occupational Therapy Discharge Summary  Patient Details  Name: [name] MRN: [id number] Date of Birth: Jan 11, 1961  Patient has met 9 of 9 long term goals due to improved activity tolerance, improved balance and postural control.  Patient to discharge at overall Supervision level.  Patient's care partner is independent to provide the necessary physical assistance at discharge.  Pt is reporting she has returned to baseline level where her husband was providing supervision-min A. He will cont to provide this assist at d/c.   Recommendation:  Patient will benefit from ongoing skilled OT services in outpatient setting to continue to advance functional skills in the area of BADL and Reduce care partner burden.  Equipment: Pt has all needed DME from previous admission  Reasons for discharge: treatment goals met and discharge from hospital  Patient/family agrees with progress made and goals achieved: Yes  OT Discharge Precautions/Restrictions  Precautions Precautions: Fall;Sternal Restrictions Weight Bearing Restrictions: No ADL ADL ADL Comments: Refer to functional navigator Vision Baseline Vision/History: Wears glasses Wears Glasses: Reading only Patient Visual Report: No change from baseline Vision Assessment?: No apparent visual deficits Perception  Perception: Within Functional Limits Praxis Praxis: Intact Cognition Overall Cognitive Status: History of cognitive impairments - at baseline Arousal/Alertness: Awake/alert Orientation Level: Oriented X4 Attention: Selective Selective Attention: Impaired Selective Attention Impairment: Functional complex Memory: Impaired Memory Impairment: Retrieval deficit Awareness: Appears intact Problem Solving: Appears intact Executive Function: Self Monitoring;Self Correcting Self Monitoring Impairment: Functional complex;Verbal complex Self Correcting: Appears intact Self Correcting Impairment: Functional complex;Verbal  complex Safety/Judgment: Appears intact Sensation Sensation Light Touch: Appears Intact Coordination Gross Motor Movements are Fluid and Coordinated: No Fine Motor Movements are Fluid and Coordinated: No Coordination and Movement Description: R hemiparesis from CVA in December 2018 Motor  Motor Motor: Hemiplegia Motor - Discharge Observations: Generalized weakness Trunk/Postural Assessment  Cervical Assessment Cervical Assessment: Within Functional Limits Thoracic Assessment Thoracic Assessment: Within Functional Limits Postural Control Postural Control: Deficits on evaluation(R lateral lean 2/2 hemiplegia)  Balance Balance Balance Assessed: Yes Static Sitting Balance Static Sitting - Level of Assistance: 7: Independent Dynamic Sitting Balance Dynamic Sitting - Level of Assistance: 6: Modified independent (Device/Increase time) Static Standing Balance Static Standing - Level of Assistance: 5: Stand by assistance Dynamic Standing Balance Dynamic Standing - Balance Support: During functional activity Dynamic Standing - Level of Assistance: 5: Stand by assistance Extremity/Trunk Assessment RUE Assessment RUE Assessment: Exceptions to WFL(hemiplegia from CVA Dec. 2018; trace movements in shoulder) LUE Assessment LUE Assessment: Within Functional Limits   See Function Navigator for Current Functional Status.  Marjorie Deprey L 12/07/2017, 10:43 AM

## 2017-12-07 NOTE — Discharge Summary (Signed)
[name], [name]              ACCOUNT NO.:  [id number]  MEDICAL RECORD NO.:  [id number]  LOCATION:                                 FACILITY:  PHYSICIAN:  Delice Lesch, MD        DATE OF BIRTH:  [birth year]  DATE OF ADMISSION:  11/29/2017 DATE OF DISCHARGE:  12/08/2017                              DISCHARGE SUMMARY   DISCHARGE DIAGNOSES: 1. Coronary artery disease with coronary artery bypass graft on November 24, 2017. 2. Subcutaneous Lovenox for deep vein thrombosis prophylaxis. 3. Pain management. 4. Hypertension. 5. 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 6. Hypertension. 7. Hyperlipidemia. 8. History of tobacco abuse. 9. Constipation. 10.Acute blood loss anemia. 11.Hyponatremia, resolved. 12.Mild hyperglycemia.  HISTORY OF PRESENT ILLNESS:  This is a 57 year old right-handed female with history of tobacco abuse, left MCA infarction in 2018 with right- sided weakness, treated with tPA, thrombectomy, loop recorder, and received inpatient rehab services.  Lives with her spouse, ambulated with an assistive device prior to admission.  Presented on November 16, 2017, with increasing shortness of breath.  Chest x-ray, suspicious for aspiration pneumonia, troponin 0.4.  Echocardiogram showed ST depressions in the anterolateral leads, echocardiogram with left ventricular systolic dysfunction with ejection fraction of 40%, diffuse hypokinesis, grade 2 diastolic dysfunction.  Cardiac catheterization showed 75% mid-to-distal left main stenosis with significant calcification.  The patient underwent CABG x2 on November 24, 2017, per Dr. Cyndia Bent.  Hospital course, pain management, sternal precautions as directed.  Acute blood loss anemia and monitored.  Subcutaneous Lovenox for DVT prophylaxis.  Physical and occupational therapy ongoing.  The patient was admitted for a comprehensive rehab program.  PAST  MEDICAL HISTORY:  See discharge diagnoses.  SOCIAL HISTORY:  Lives with spouse, one-level home, three steps to entry.  Ambulating short distances with a quad cane.  Functional status upon admission to [facility] was moderate assist, 80 feet with a 4- wheeled walker, moderate assist sit-to-stand, min-to-mod assist with activities of daily living.  PHYSICAL EXAMINATION:  VITAL SIGNS:  Blood pressure 120/93, pulse 98, temperature 97, and respirations 18. GENERAL:  Alert female.  Oriented x3. HEENT:  EOMs intact. NECK:  Supple, nontender.  No JVD. CARDIAC:  Rate controlled. ABDOMEN:  Soft, nontender.  Good bowel sounds. LUNGS:  Clear to auscultation without wheeze. MUSCULOSKELETAL:  Right upper extremity 2-/5 proximal to distal with apraxia, right lower extremity 4-/5 hip flexors, 3+/5 knee extension.  REHABILITATION HOSPITAL COURSE:  The patient was admitted to Inpatient Rehab Services.  Therapies initiated on a 3-hour daily basis, consisting of physical therapy, occupational therapy, and rehabilitation nursing as well as speech therapy.  The following issues were addressed during the patient's rehabilitation stay.  Pertaining to Ms. Branch's CAD with CABG on November 24, 2017, surgical site well healed.  Sternal precautions. She would follow up with Cardiothoracic Surgery.  No chest pain or shortness of breath.  Subcutaneous Lovenox for DVT prophylaxis.  No bleeding episodes.  Pain management with the use of baclofen 5 mg 3 times daily as well as oxycodone and Ultram as needed.  Blood pressures  controlled on Coreg as well as Lasix, was adjusted to 20 mg.  No orthostatic changes.  She had a history of tobacco abuse, receiving full counseling in regard to cessation of nicotine products.  It was questionable if she would be compliant with these requests.  Acute blood loss anemia, 9.3 and monitored.  Noted history of depression.  She was maintained on Lexapro.  Emotional support  provided.  She was attending full therapies.  Mild hyponatremia, resolved to 135.  Mild hyperglycemia, continued to greatly improve.  She was counseled on a diabetic diet if needed.  The patient received weekly collaborative interdisciplinary team conferences to discuss estimated length of stay, family teaching, any barriers to discharge.  She could propel her wheelchair from the room to the gymnasium a 150 feet with right lower extremity in supervision, ambulate a 100 feet with quad cane, minimal assist, working on endurance; ambulates to the rehab apartment, working on ambulation around furniture and sitting on a recliner; ambulates back to her room with minimal assistance.  Gather belongings for activities of daily living and homemaking.  Needed some assistance for donning a resting hand splint with minimal assistance.  She is tolerating a regular consistency diet.  Full family teaching was completed and plan discharge to home.  DISCHARGE MEDICATIONS: 1. Aspirin 325 mg p.o. daily. 2. Baclofen 5 mg p.o. t.i.d. 3. Coreg 6.25 mg p.o. b.i.d. 4. Colace 200 mg p.o. daily. 5. Lexapro 10 mg p.o. daily. 6. Lasix 20 mg p.o. daily. 7. Mucinex 1200 mg p.o. b.i.d. 8. Atarax 25 mg p.o. at bedtime. 9. Protonix 40 mg p.o. daily. 10.Potassium chloride 20 mEq p.o. daily. 11.Crestor 20 mg p.o. daily. 12.Ultram 50 mg p.o. every 6 hours as needed for pain.  DIET:  Her diet was a regular consistency.  FOLLOWUP: 1. She would follow up with Dr. Posey Pronto at the Outpatient Rehab Service     office as directed. 2. Dr. Cyndia Bent, Cardiothoracic Surgery, call for appointment. 3. Dr. [name], Cardiology Services, call for appointment. 4. Dr. Howard Pouch, Medical Management.  SPECIAL INSTRUCTIONS:  No driving.  No smoking.  No alcohol.  Continue sternal precautions.     [name], P.A.   ______________________________ Delice Lesch, MD    DA/MEDQ  D:  12/07/2017  T:  12/07/2017  Job:   [id number]  cc:   Howard Pouch, MD Delice Lesch, MD Satira Sark, MD Gilford Raid, M.D.

## 2017-12-07 NOTE — Progress Notes (Signed)
Physical Therapy Session Note  Patient Details  Name: [name] MRN: [id number] Date of Birth: [birth year]  Today's Date: 12/07/2017 PT Individual Time: 1435-1530 PT Individual Time Calculation (min): 55 min   Short Term Goals: Week 1:  PT Short Term Goal 1 (Week 1): STG=LTG    Skilled Therapeutic Interventions/Progress Updates:   Pt received sitting in WC and agreeable to PT  Pt transported to main entrance of hospital in [facility].   Gait training on unlevel surface of cement sidewalk at Main entrance 11f x2 [facility] assist from PT. Min cues for AD management and step length on the RLE to improve safety. Pt returned to rehab gym in CIR unit.   Patient demonstrates increased fall risk as noted by score of   44/56 on Berg Balance Scale.  (<36= high risk for falls, close to 100%; 37-45 significant >80%; 46-51 moderate >50%; 52-55 lower >25%).     PT instructed pt in Grad day assessment to measure progress toward goals. See below for details.  [facility] to [facility] and [facility] reach and all needs met.       Therapy Documentation Precautions:  Precautions Precautions: Fall, Sternal Precaution Comments: Verbally reviewed sternal precautions; residual R-side weakness Restrictions Weight Bearing Restrictions: No RUE Weight Bearing: (sternal precautions) LUE Weight Bearing: (sternal precaution) Other Position/Activity Restrictions: sternal precautions.     Vital Signs: Therapy Vitals Temp: 98 F (36.7 C) Temp Source: Oral Pulse Rate: 76 BP: 92/72 Patient Position (if appropriate): Sitting Oxygen Therapy SpO2: 96 % O2 Device: Room Air Pain:   dnies  Mobility: Bed Mobility Bed Mobility: Rolling Right;Rolling Left;Sit to Supine Rolling Right: 6: Modified independent (Device/Increase time) Rolling Left: 6: Modified independent (Device/Increase time) Supine to Sit: 6: Modified independent (Device/Increase time) Sit to  Supine: 6: Modified independent (Device/Increase time) Transfers Sit to Stand: 6: Modified independent (Device/Increase time) Stand to Sit: 6: Modified independent (Device/Increase time) Stand Pivot Transfers: 5: Supervision Stand Pivot Transfer Details (indicate cue type and reason): with Rw Locomotion : Ambulation Ambulation: Yes Ambulation/Gait Assistance: 5: Supervision Ambulation Distance (Feet): 150 Feet Assistive device: Small based quad cane Gait Gait: Yes Gait Pattern: Step-through pattern;Right flexed knee in stance;Narrow base of support Stairs / Additional Locomotion Stairs: Yes Stairs Assistance: 5: Supervision Stair Management Technique: One rail Left Number of Stairs: 8 Height of Stairs: 6 Ramp: 5: Supervision Wheelchair Mobility Wheelchair Mobility: No   Balance: Standardized Balance Assessment Standardized Balance Assessment: BFinancial controllerTest Sit to Stand: Able to stand without using hands and stabilize independently Standing Unsupported: Able to stand safely 2 minutes Sitting with Back Unsupported but Feet Supported on Floor or Stool: Able to sit safely and securely 2 minutes Stand to Sit: Sits safely with minimal use of hands Transfers: Able to transfer safely, minor use of hands Standing Unsupported with Eyes Closed: Able to stand 10 seconds safely Standing Ubsupported with Feet Together: Able to place feet together independently and stand for 1 minute with supervision From Standing, Reach Forward with Outstretched Arm: Can reach confidently >25 cm (10") From Standing Position, Pick up Object from Floor: Able to pick up shoe, needs supervision From Standing Position, Turn to Look Behind Over each Shoulder: Looks behind one side only/other side shows less weight shift Turn 360 Degrees: Needs close supervision or verbal cueing Standing Unsupported, Alternately Place Feet on Step/Stool: Able to complete 4 steps without aid or  supervision Standing Unsupported, One Foot  in Front: Able to plae foot ahead of the other independently and hold 30 seconds Standing on One Leg: Tries to lift leg/unable to hold 3 seconds but remains standing independently Total Score: 44   See Function Navigator for Current Functional Status.   Therapy/Group: Individual Therapy  Lorie Phenix 12/07/2017, 4:08 PM

## 2017-12-07 NOTE — Progress Notes (Signed)
Physical Therapy Discharge Summary  Patient Details  Name: [name] MRN: [id number] Date of Birth: 1961-07-06  Today's Date: 12/07/2017   Patient has met 10 of 10 long term goals due to improved activity tolerance, improved balance, increased strength and decreased pain.  Patient to discharge at an ambulatory level Supervision.   Patient's care partner is independent to provide the necessary physical assistance at discharge. Pt is at baseline, husband [name]) was providing hands-on care for pt prior to most recent admission to rehab.   Reasons goals not met: all PT goals met   Recommendation:  Patient will benefit from ongoing skilled PT services in outpatient setting to continue to advance safe functional mobility, address ongoing impairments in cardiovascular endurance, functional mobility, balance, and quality of gait, and minimize fall risk.  Equipment: No equipment provided. Pt has equipment from previous rehab admission.   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 December 2018 Motor  Motor Motor: Hemiplegia Motor - Discharge Observations: hx of R hemiplegia   Mobility Bed Mobility Bed Mobility: Rolling Right;Rolling Left;Sit to Supine Rolling Right: 6: Modified independent (Device/Increase time) Rolling Left: 6: Modified independent (Device/Increase time) Supine to Sit: 6: Modified independent (Device/Increase time) Sit to Supine:  6: Modified independent (Device/Increase time) Transfers Sit to Stand: 6: Modified independent (Device/Increase time) Stand to Sit: 6: Modified independent (Device/Increase time) Stand Pivot Transfers: 5: Supervision Stand Pivot Transfer Details (indicate cue type and reason): with Rw Locomotion  Ambulation Ambulation: Yes Ambulation/Gait Assistance: 5: Supervision Ambulation Distance (Feet): 150 Feet Assistive device: Small based quad cane Gait Gait: Yes Gait Pattern: Step-through pattern;Right flexed knee in stance;Narrow base of support Stairs / Additional Locomotion Stairs: Yes Stairs Assistance: 5: Supervision Stair Management Technique: One rail Left Number of Stairs: 8 Height of Stairs: 6 Ramp: 5: Supervision Wheelchair Mobility Wheelchair Mobility: No  Trunk/Postural Assessment     Balance Standardized Balance Assessment Standardized Balance Assessment: Financial controller Test Sit to Stand: Able to stand without using hands and stabilize independently Standing Unsupported: Able to stand safely 2 minutes Sitting with Back Unsupported but Feet Supported on Floor or Stool: Able to sit safely and securely 2 minutes Stand to Sit: Sits safely with minimal use of hands Transfers: Able to transfer safely, minor use of hands Standing Unsupported with Eyes Closed: Able to stand 10 seconds safely Standing Ubsupported with Feet Together: Able to place feet together independently and stand for 1 minute with supervision From Standing, Reach Forward with Outstretched Arm: Can reach confidently >25 cm (10") From Standing Position, Pick up Object from Floor: Able to pick up shoe, needs supervision From Standing Position, Turn to Look Behind Over each Shoulder: Looks behind one side only/other side shows less weight shift Turn 360 Degrees: Needs close supervision or verbal cueing Standing Unsupported, Alternately Place Feet on Step/Stool: Able to complete 4 steps without  aid or supervision Standing Unsupported, One Foot in Front: Able to plae foot ahead of the other independently and hold 30 seconds Standing on One  Leg: Tries to lift leg/unable to hold 3 seconds but remains standing independently Total Score: 44 Extremity Assessment      RLE Assessment RLE Assessment: Exceptions to [facility] RLE Strength Right Hip Flexion: 4/5 Right Knee Flexion: 4/5 Right Knee Extension: 4+/5 Right Ankle Dorsiflexion: 4-/5 LLE Assessment LLE Assessment: Within Functional Limits   See Function Navigator for Current Functional Status.  Lorie Phenix 12/07/2017, 3:28 PM

## 2017-12-07 NOTE — Progress Notes (Signed)
Physical Therapy Session Note  Patient Details  Name: CARMIE LANPHER MRN: [id number] Date of Birth: 29-Jun-1961  Today's Date: 12/07/2017 PT Individual Time: 1000-1045 PT Individual Time Calculation (min): 45 min   Short Term Goals: Week 1:  PT Short Term Goal 1 (Week 1): STG=LTG  Skilled Therapeutic Interventions/Progress Updates:    no c/o pain, but reporting some stiffness in anterior chest/pecs.  Session focus on d/c assessment and pt education.  Pt ambulates throughout unit with SBQC and slow gait speed, supervision for safety.  Car transfer with supervision and min cues for maintaining sternal precautions.  Stair negotiation x12 steps with SBQC and close supervision/min guard on last 4 2/2 fatigue. Pt returned to room at end of session and positioned in w/c with chair alarm activated, call bell in reach and needs met.   Therapy Documentation Precautions:  Precautions Precautions: Fall, Sternal Precaution Comments: Verbally reviewed sternal precautions; residual R-side weakness Restrictions Weight Bearing Restrictions: No RUE Weight Bearing: (sternal precautions) LUE Weight Bearing: (sternal precaution) Other Position/Activity Restrictions: sternal precautions.     See Function Navigator for Current Functional Status.   Therapy/Group: Individual Therapy  Michel Santee 12/07/2017, 12:05 PM

## 2017-12-07 NOTE — Progress Notes (Signed)
Physical Therapy Session Note  Patient Details  Name: [name] MRN: [id number] Date of Birth: 1961/02/28  Today's Date: 12/07/2017 PT Individual Time: 1330-1400 PT Individual Time Calculation (min): 30 min   Short Term Goals: Week 1:  PT Short Term Goal 1 (Week 1): STG=LTG  Skilled Therapeutic Interventions/Progress Updates:    Session focused on family education with pt and pt's husband in preparation for d/c tomorrow. Focused on real bed transfers and bed mobility in ADL apartment with discussion about utilizing an aerobic step (due to high bed height) and wedge pillow for improved tolerance to supine. Steadying assist for step up/down with QC when practicing transfer. Pt able to perform bed mobility at mod I level including adherence to sternal precautions and management of RUE (hemi). Education provided on home set-up, continued activity and pt/husband discussing dietary changes due to h/o CVA and now cardiac issues. Pt self propelled w/c to therapy session with LLE mod I. Denies further concerns regards to d/c.   Therapy Documentation Precautions:  Precautions Precautions: Fall, Sternal Precaution Comments: Verbally reviewed sternal precautions; residual R-side weakness Restrictions Weight Bearing Restrictions: No RUE Weight Bearing: (sternal precautions) LUE Weight Bearing: (sternal precaution) Other Position/Activity Restrictions: sternal precautions.    Pain:  Denies pain   See Function Navigator for Current Functional Status.   Therapy/Group: Individual Therapy  Canary Brim Ivory Broad, PT, DPT  12/07/2017, 3:07 PM

## 2017-12-08 ENCOUNTER — Telehealth: Payer: Self-pay | Admitting: *Deleted

## 2017-12-08 ENCOUNTER — Other Ambulatory Visit: Payer: Self-pay

## 2017-12-08 DIAGNOSIS — I480 Paroxysmal atrial fibrillation: Secondary | ICD-10-CM

## 2017-12-08 MED ORDER — GUAIFENESIN ER 600 MG PO TB12: 1200.0000 mg | ORAL_TABLET | Freq: Two times a day (BID) | ORAL | 0 refills | Status: DC

## 2017-12-08 MED ORDER — FAMOTIDINE 20 MG PO TABS: 20.0000 mg | ORAL_TABLET | Freq: Two times a day (BID) | ORAL | 3 refills | Status: DC

## 2017-12-08 MED ORDER — CARVEDILOL 6.25 MG PO TABS: 6.2500 mg | ORAL_TABLET | Freq: Two times a day (BID) | ORAL | 1 refills | Status: DC

## 2017-12-08 MED ORDER — TRAMADOL HCL 50 MG PO TABS: 50.0000 mg | ORAL_TABLET | Freq: Four times a day (QID) | ORAL | 0 refills | Status: DC | PRN

## 2017-12-08 MED ORDER — BACLOFEN 10 MG PO TABS: 5.0000 mg | ORAL_TABLET | Freq: Three times a day (TID) | ORAL | 1 refills | Status: AC

## 2017-12-08 MED ORDER — HYDROXYZINE HCL 25 MG PO TABS: 25.0000 mg | ORAL_TABLET | Freq: Every day | ORAL | 1 refills | Status: DC

## 2017-12-08 MED ORDER — FUROSEMIDE 20 MG PO TABS: 20.0000 mg | ORAL_TABLET | Freq: Every day | ORAL | 0 refills | Status: DC

## 2017-12-08 MED ORDER — POTASSIUM CHLORIDE ER 10 MEQ PO TBCR: 20.0000 meq | EXTENDED_RELEASE_TABLET | Freq: Every day | ORAL | 0 refills | Status: DC

## 2017-12-08 MED ORDER — ESCITALOPRAM OXALATE 10 MG PO TABS: 10.0000 mg | ORAL_TABLET | Freq: Every day | ORAL | 1 refills | Status: DC

## 2017-12-08 NOTE — Patient Outreach (Signed)
Telephone outreach to patient to obtain mRS was successfully completed. mRS = 3 

## 2017-12-08 NOTE — Telephone Encounter (Signed)
LMOVM (DPR) requesting manual Carelink transmission for review.  Gave instructions and direct number for questions/concerns.  Received alert for 14 "pause" episodes, available ECG false and occurred during patient's CABG on 11/23/17.  Will review additional ECGs when transmission received.

## 2017-12-08 NOTE — Progress Notes (Signed)
[employer] PHYSICAL MEDICINE & REHABILITATION     PROGRESS NOTE  Subjective/Complaints:  In bed. Excited to be going home. Husband entered room while I was present  ROS: Patient denies fever, rash, sore throat, blurred vision, nausea, vomiting, diarrhea, cough, shortness of breath or chest pain, joint or back pain, headache, or mood change.    Objective: Vital Signs: Blood pressure (!) 113/57, pulse 81, temperature 98.1 F (36.7 C), temperature source Oral, resp. rate 18, height _0  (1.549 m), weight 56.6 kg (124 lb 12.5 oz), SpO2 96 %. No results found. No results for input(s): WBC, HGB, HCT, PLT in the last 72 hours. Recent Labs    12/06/17 0545  CREATININE 0.76   CBG (last 3)  No results for input(s): GLUCAP in the last 72 hours.  Wt Readings from Last 3 Encounters:  12/08/17 56.6 kg (124 lb 12.5 oz)  11/29/17 60.5 kg (133 lb 6.1 oz)  11/10/17 58.5 kg (129 lb)    Physical Exam:  BP (!) 113/57 (BP Location: Left Arm)   Pulse 81   Temp 98.1 F (36.7 C) (Oral)   Resp 18   Ht _1  (1.549 m)   Wt 56.6 kg (124 lb 12.5 oz)   LMP  (LMP Unknown)   SpO2 96%   BMI 23.58 kg/m  Constitutional: No distress . Vital signs reviewed. HEENT: EOMI, oral membranes moist Cardiovascular: RRR without murmur. No JVD    Respiratory: CTA Bilaterally without wheezes or rales. Normal effort    GI: BS +, non-tender, non-distended  Musculoskeletal: No edema or tenderness in extremities  Neurological:  Alert. Follows commands. Motor: RUE: 2-/5 proximal to distal with apraxia (stable) RLE: 4--4/5 HF, KE, ADF   Skin: Skin is warm and dry.  Psychiatric: Normal mood. Normal behavior.    Assessment/Plan: 1. Functional deficits secondary to debility which require 3+ hours per day of interdisciplinary therapy in a comprehensive inpatient rehab setting. Physiatrist is providing close team supervision and 24 hour management of active medical problems listed below. Physiatrist and rehab team  continue to assess barriers to discharge/monitor patient progress toward functional and medical goals.  Function:  Bathing Bathing position   Position: Shower  Bathing parts Body parts bathed by patient: Right arm, Chest, Abdomen, Front perineal area, Buttocks, Right upper leg, Left upper leg, Right lower leg, Left lower leg, Left arm, Back Body parts bathed by helper: Left arm, Back  Bathing assist Assist Level: Supervision or verbal cues      Upper Body Dressing/Undressing Upper body dressing   What is the patient wearing?: Pull over shirt/dress     Pull over shirt/dress - Perfomed by patient: Thread/unthread right sleeve, Thread/unthread left sleeve, Put head through opening, Pull shirt over trunk          Upper body assist Assist Level: More than reasonable time      Lower Body Dressing/Undressing Lower body dressing   What is the patient wearing?: Pants, Shoes     Pants- Performed by patient: Thread/unthread right pants leg, Thread/unthread left pants leg, Pull pants up/down   Non-skid slipper socks- Performed by patient: Don/doff right sock, Don/doff left sock       Shoes - Performed by patient: Don/doff right shoe, Don/doff left shoe         TED Hose - Performed by helper: Don/doff right TED hose, Don/doff left TED hose  Lower body assist Assist for lower body dressing: Supervision or verbal cues      Toileting Toileting  Toileting steps completed by patient: Adjust clothing prior to toileting, Performs perineal hygiene, Adjust clothing after toileting Toileting steps completed by helper: Adjust clothing prior to toileting, Performs perineal hygiene, Adjust clothing after toileting Toileting Assistive Devices: Grab bar or rail  Toileting assist Assist level: Supervision or verbal cues   Transfers Chair/bed transfer   Chair/bed transfer method: Stand pivot, Ambulatory Chair/bed transfer assist level: Supervision or verbal cues Chair/bed transfer  assistive device: Librarian, academic     Max distance: 150' Assist level: Supervision or verbal cues   Wheelchair   Type: Manual Max wheelchair distance: 150 Assist Level: Supervision or verbal cues  Cognition Comprehension Comprehension assist level: Follows complex conversation/direction with no assist  Expression Expression assist level: Expresses basic 90% of the time/requires cueing < 10% of the time.  Social Interaction Social Interaction assist level: Interacts appropriately with others with medication or extra time (anti-anxiety, antidepressant).  Problem Solving Problem solving assist level: Solves complex 90% of the time/cues < 10% of the time  Memory Memory assist level: Recognizes or recalls 90% of the time/requires cueing < 10% of the time    Medical Problem List and Plan: 1.  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  DVT Prophylaxis/Anticoagulation: Subcutaneous Lovenox 3. Pain Management: Baclofen 5 mg TID,Oxycodone/Ultram as needed 4. Mood:Lexapro 10 mg daily  5. Neuropsych: This patient is capable of making decisions on her own behalf. 6. Skin/Wound Care: Routine skin checks 7. Fluids/Electrolytes/Nutrition: Routine I&Os   Cr. WNL on 3/13 8.Hypertension.Coreg 3.125 mg BID   Lasix 40 mg daily, decreased to 86m on 3/13    Relatively controlled on 3/15 9. History of tobacco abuse. Provide counseling 10. Hyperlipidemia. Crestor 11. Constipation. Laxative assistance 12. SOB: Resolved     13. Leukocytosis   WBCs 11.9 on 3/11   Afebrile   UA relatively unremarkable 14. Acute blood loss anemia   Hemoglobin 9.3 on 3/11   Continue to monitor 15. Hyponatremia: Resolved   Sodium 135 on 3/11  Continue to monitor 16. Hyperglycemia   Mildly elevated on 3/11   Cont to  monitor  LOS (Days) 9 A FACE TO FACE EVALUATION WAS PERFORMED  ZMeredith Staggers3/15/2019 10:24 AM

## 2017-12-08 NOTE — Progress Notes (Signed)
Pt A&O x 4. Discharging facility with pt belongings. PA - Dan provided pt with dc instructions and pt understands. Had no further complaints or concerns. Pt escorted to ground floor with husband.

## 2017-12-11 ENCOUNTER — Encounter: Payer: BLUE CROSS/BLUE SHIELD | Admitting: Occupational Therapy

## 2017-12-11 ENCOUNTER — Ambulatory Visit: Payer: BLUE CROSS/BLUE SHIELD | Admitting: Physical Therapy

## 2017-12-13 ENCOUNTER — Ambulatory Visit: Payer: BLUE CROSS/BLUE SHIELD | Admitting: Physical Therapy

## 2017-12-13 ENCOUNTER — Encounter: Payer: BLUE CROSS/BLUE SHIELD | Admitting: Occupational Therapy

## 2017-12-13 NOTE — Telephone Encounter (Signed)
Spoke with patient's husband to give manual [facility] instructions.  He will attempt to send a transmission tonight as we still have not received one.

## 2017-12-14 NOTE — Progress Notes (Signed)
Cardiology Office Note    Date:  12/16/2017   ID:  [name], DOB [birth year], MRN [id number]  PCP:  Kaitlyn Hillock, DO  Cardiologist: [name], MD    Chief Complaint  Patient presents with  . Hospitalization Follow-up    s/p CABG    History of Present Illness:    [name] is a 57 y.o. female with past medical history of prior CVA (occurring in 07/2017 with residual right arm paralysis) and prior tobacco use who presents to the office today for hospital follow-up.  She was recently admitted to [facility] on 11/16/2017 for evaluation of worsening dyspnea on exertion over the past several days, admitted with acute respiratory failure in the setting of PNA. She was found to have an NSTEMI with troponin peak of 10.5 and was transferred to [facility] for anticipation of a cardiac catheterization once respiratory status improved. Echo showed a reduced EF of 35 to 40% with diffuse hypokinesis and akinesis of the basal mid inferior lateral, inferior, and inferior septal myocardium. A cardiac catheterization was performed on 11/20/2017 and showed severe left main disease with 75% stenosis along with 100% proximal LCx stenosis, 40% ostial LAD stenosis, and 90% proximal RCA stenosis (small-nondominant vessel). CT surgery was consulted and she underwent CABG on 11/23/2017 with LIMA-LAD and SVG-OM. She initially required milrinone and IV diuresis but volume status improved throughout admission. She maintained normal sinus rhythm throughout. Was discharged to CIR on 11/29/2017. She remained in inpatient rehab until 12/08/2017 and progressed well with PT and OT. Was discharged on ASA 325mg  daily, Coreg 6.25mg  BID, Lasix 20mg  daily, and Crestor 20mg  daily.   In talking with the patient today, she reports overall progressing well since her recent hospitalization. She still has sternal chest pain but says this has continued to improve. Denies any recent dyspnea on exertion, orthopnea, or PND.  Has noticed swelling along her right lower extremity since CABG.    She reports good compliance with her medication regimen and denies missing any recent doses.She has not checked blood pressure since hospital discharge but it is at 148/84 during today's visit.  Remains on ASA 325mg  daily and denies any evidence of active bleeding.    Past Medical History:  Diagnosis Date  . Arthritis    "knees" (11/16/2017)  . CAD (coronary artery disease)    a. s/p NSTEMI in 10/2017 with cath showing LM disease --> s/p CABG on 11/23/2017 with LIMA-LAD and SVG-OM.   [name] Hay fever   . Pneumonia 11/16/2017  . Stroke [facility]) 08/23/2017   Left MCA infarct status post TPA and thrombectomy    Past Surgical History:  Procedure Laterality Date  . CORONARY ARTERY BYPASS GRAFT N/A 11/23/2017   Procedure: CORONARY ARTERY BYPASS GRAFTING (CABG) x 2, ON PUMP, USING LEFT INTERNAL MAMMARY ARTERY AND RIGHT GREATER SAPHENOUS VEIN HARVESTED ENDOSCOPICALLY;  Surgeon: Gaye Pollack, MD;  Location: [place];  Service: Open Heart Surgery;  Laterality: N/A;  . CRYOABLATION     cervical  . IR ANGIO INTRA EXTRACRAN SEL COM CAROTID INNOMINATE UNI R MOD SED  08/21/2017  . IR ANGIO VERTEBRAL SEL SUBCLAVIAN INNOMINATE UNI R MOD SED  08/21/2017  . IR PERCUTANEOUS ART THROMBECTOMY/INFUSION INTRACRANIAL INC DIAG ANGIO  08/21/2017  . IR RADIOLOGIST EVAL & MGMT  10/30/2017  . LOOP RECORDER INSERTION N/A 08/28/2017   Procedure: LOOP RECORDER INSERTION;  Surgeon: Constance Haw, MD;  Location: [place] [facility];  Service: Cardiovascular;  Laterality: N/A;  .  RADIOLOGY WITH ANESTHESIA N/A 08/21/2017   Procedure: RADIOLOGY WITH ANESTHESIA;  Surgeon: Luanne Bras, MD;  Location: [place];  Service: Radiology;  Laterality: N/A;  . RIGHT/LEFT HEART CATH AND CORONARY ANGIOGRAPHY N/A 11/20/2017   Procedure: RIGHT/LEFT HEART CATH AND CORONARY ANGIOGRAPHY;  Surgeon: Jettie Booze, MD;  Location: [place] [facility];  Service:  Cardiovascular;  Laterality: N/A;  . TEE WITHOUT CARDIOVERSION N/A 08/28/2017   Procedure: TRANSESOPHAGEAL ECHOCARDIOGRAM (TEE);  Surgeon: Fay Records, MD;  Location: [place];  Service: Cardiovascular;  Laterality: N/A;  . TEE WITHOUT CARDIOVERSION N/A 11/23/2017   Procedure: TRANSESOPHAGEAL ECHOCARDIOGRAM (TEE);  Surgeon: Gaye Pollack, MD;  Location: [place];  Service: Open Heart Surgery;  Laterality: N/A;  . TUBAL LIGATION      Current Medications: Outpatient Medications Prior to Visit  Medication Sig Dispense Refill  . aspirin 325 MG EC tablet Take 1 tablet (325 mg total) by mouth daily. 90 tablet 3  . baclofen (LIORESAL) 10 MG tablet Take 0.5 tablets (5 mg total) by mouth 3 (three) times daily. 45 tablet 1  . bisacodyl (DULCOLAX) 5 MG EC tablet Take 2 tablets (10 mg total) by mouth daily as needed for moderate constipation (Give daily if no BM). 30 tablet 0  . carvedilol (COREG) 6.25 MG tablet Take 1 tablet (6.25 mg total) by mouth 2 (two) times daily with a meal. 60 tablet 1  . cetirizine (ZYRTEC) 10 MG tablet Take 10 mg by mouth daily.    [name] docusate sodium (COLACE) 100 MG capsule Take 2 capsules (200 mg total) by mouth daily. 10 capsule 0  . escitalopram (LEXAPRO) 10 MG tablet Take 1 tablet (10 mg total) by mouth daily. 90 tablet 1  . famotidine (PEPCID) 20 MG tablet Take 1 tablet (20 mg total) by mouth 2 (two) times daily. 180 tablet 3  . furosemide (LASIX) 20 MG tablet Take 1 tablet (20 mg total) by mouth daily. 30 tablet 0  . guaiFENesin (MUCINEX) 600 MG 12 hr tablet Take 2 tablets (1,200 mg total) by mouth 2 (two) times daily. 60 tablet 0  . hydrOXYzine (ATARAX/VISTARIL) 25 MG tablet Take 1 tablet (25 mg total) by mouth at bedtime. 90 tablet 1  . levalbuterol (XOPENEX) 0.63 MG/3ML nebulizer solution Take 3 mLs (0.63 mg total) by nebulization every 8 (eight) hours as needed for wheezing or shortness of breath. 3 mL 12  . potassium chloride (K-DUR) 10 MEQ tablet Take 2 tablets  (20 mEq total) by mouth daily. 30 tablet 0  . rosuvastatin (CRESTOR) 20 MG tablet Take 1 tablet (20 mg total) by mouth daily. 90 tablet 3  . traMADol (ULTRAM) 50 MG tablet Take 1 tablet (50 mg total) by mouth every 6 (six) hours as needed for moderate pain. 30 tablet 0   No facility-administered medications prior to visit.      Allergies:   Lopressor [metoprolol tartrate] and Lipitor [atorvastatin]   Social History   Socioeconomic History  . Marital status: Married    Spouse name: Not on file  . Number of children: 2  . Years of education: Not on file  . Highest education level: Not on file  Occupational History  . Not on file  Social Needs  . Financial resource strain: Not on file  . Food insecurity:    Worry: Not on file    Inability: Not on file  . Transportation needs:    Medical: Not on file    Non-medical: Not on file  Tobacco  Use  . Smoking status: Former Smoker    Packs/day: 0.12    Years: 37.00    Pack years: 4.44    Types: Cigarettes    Last attempt to quit: 08/21/2017    Years since quitting: 0.3  . Smokeless tobacco: Never Used  Substance and Sexual Activity  . Alcohol use: No    Frequency: Never  . Drug use: No  . Sexual activity: Not on file  Lifestyle  . Physical activity:    Days per week: Not on file    Minutes per session: Not on file  . Stress: Not on file  Relationships  . Social connections:    Talks on phone: Patient refused    Gets together: Patient refused    Attends religious service: Patient refused    Active member of club or organization: Patient refused    Attends meetings of clubs or organizations: Patient refused    Relationship status: Patient refused  Other Topics Concern  . Not on file  Social History Narrative   Married. 2 children.    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    Walks with cane or wheelchair.    Former smoker.    Drinks caffeine.    Smoke alarm in the home.   Firearms in the home.    Wears her seatbelt.     Feels safe In her relationships.      Family History:  The patient's family history includes Diabetes type II in her mother; Heart attack in her father; Hyperlipidemia in her mother; Hypertension in her brother and mother.   Review of Systems:   Please see the history of present illness.     General:  No chills, fever, night sweats or weight changes.  Cardiovascular:  No dyspnea on exertion, edema, orthopnea, palpitations, paroxysmal nocturnal dyspnea. Positive for sternal chest pain.  Dermatological: No rash, lesions/masses Respiratory: No cough, dyspnea Urologic: No hematuria, dysuria Abdominal:   No nausea, vomiting, diarrhea, bright red blood per rectum, melena, or hematemesis Neurologic:  No visual changes, changes in mental status. Positive for weakness.  All other systems reviewed and are otherwise negative except as noted above.   Physical Exam:    VS:  BP (!) 148/84   Pulse (!) 120   Ht 5\' 1"  (1.549 m)   Wt 122 lb 6.4 oz (55.5 kg)   LMP  (LMP Unknown)   SpO2 93% Comment: on room air  BMI 23.13 kg/m    General: Well developed, well nourished Caucasian female appearing in no acute distress. Head: Normocephalic, atraumatic, sclera non-icteric, no xanthomas, nares are without discharge.  Neck: No carotid bruits. JVD not elevated.  Lungs: Respirations regular and unlabored, without wheezes or rales.   Heart: Regular rhythm, tachycardiac rate. No S3 or S4.  No murmur, no rubs, or gallops appreciated. Sternal incision appears well-healing with no erythema or drainage noted.  Abdomen: Soft, non-tender, non-distended with normoactive bowel sounds. No hepatomegaly. No rebound/guarding. No obvious abdominal masses. Msk:  Strength and tone appear normal for age. No joint deformities or effusions. Extremities: No clubbing or cyanosis. Trace lower extremity edema.  Distal pedal pulses are 2+ bilaterally. Neuro: Alert and oriented X 3. Unable to move right arm. Psych:  Responds to  questions appropriately with a normal affect. Skin: No rashes or lesions noted  Wt Readings from Last 3 Encounters:  12/15/17 122 lb 6.4 oz (55.5 kg)  12/08/17 124 lb 12.5 oz (56.6 kg)  11/29/17 133 lb 6.1 oz (60.5 kg)  Studies/Labs Reviewed:   EKG:  EKG is ordered today.  The ekg ordered today demonstrates sinus tachycardia, HR 120, with PVC's.  Recent Labs: 08/22/2017: TSH 1.086 11/18/2017: B Natriuretic Peptide 1,276.7 11/24/2017: Magnesium 2.3 11/30/2017: ALT 10 12/15/2017: BUN 19; Creatinine, Ser 0.68; Hemoglobin 10.0; Platelets 606; Potassium 3.7; Sodium 141   Lipid Panel    Component Value Date/Time   CHOL 237 (H) 08/22/2017 0811   TRIG 295 (H) 08/22/2017 0811   TRIG 301 (H) 08/22/2017 0811   HDL 35 (L) 08/22/2017 0811   CHOLHDL 6.8 08/22/2017 0811   VLDL 59 (H) 08/22/2017 0811   LDLCALC 143 (H) 08/22/2017 0811    Additional studies/ records that were reviewed today include:   Echocardiogram: 11/16/2017 Study Conclusions  - Left ventricle: The cavity size was mildly dilated. Wall   thickness was increased in a pattern of mild LVH. Systolic   function was moderately reduced. The estimated ejection fraction   was in the range of 35% to 40%. Diffuse hypokinesis. There is   akinesis of the basal-midinferolateral, inferior, and   inferoseptal myocardium. Features are consistent with a   pseudonormal left ventricular filling pattern, with concomitant   abnormal relaxation and increased filling pressure (grade 2   diastolic dysfunction). - Mitral valve: Mildly thickened leaflets . There was mild to   moderate regurgitation. - Left atrium: The atrium was mildly dilated. - Right atrium: Central venous pressure (est): 8 mm Hg. - Atrial septum: No defect or patent foramen ovale was identified. - Tricuspid valve: There was trivial regurgitation. - Pulmonary arteries: Systolic pressure could not be accurately   estimated. - Pericardium, extracardiac: There was no  pericardial effusion.  Cardiac Catheterization: 11/20/2017  Prox Cx to Mid Cx lesion is 100% stenosed.  Mid LM to Dist LM lesion is 75% stenosed.  Ost Cx to Prox Cx lesion is 50% stenosed.  Ost LAD to Prox LAD lesion is 40% stenosed.  Mid LAD lesion is 40% stenosed.  There is severe left ventricular systolic dysfunction.  The left ventricular ejection fraction is 25-35% by visual estimate.  There is no aortic valve stenosis.  LV end diastolic pressure is normal.  Prox RCA lesion is 90% stenosed.  Ao sat 93%; PA sat 62%; CO 3.5 L/min; CI 2.29; mean PCWP 2 mm Hg. Normal right heart pressures.   Severe left main disease.   Severely decreased LV function.  Normal LVEDP.    Resume heparin 6 hours post sheath pull.    Assessment:    1. Coronary artery disease involving native coronary artery of native heart without angina pectoris   2. S/P CABG x 2   3. Chronic combined systolic and diastolic CHF (congestive heart failure) ([place])   4. Ischemic cardiomyopathy   5. Medication management   6. Anemia, unspecified type   7. History of CVA (cerebrovascular accident)      Plan:   In order of problems listed above:  1. CAD/ History of CABG - recently admitted for worsening dyspnea on exertion and was found to have an NSTEMI and PNA. Troponin peaked at 10.5 and her catheterization showed severe left main disease with 75% stenosis along with 100% proximal LCx stenosis, 40% ostial LAD stenosis, and 90% proximal RCA stenosis (small-nondominant vessel). Underwent CABG on 11/24/2017 with LIMA-LAD and SVG-OM. Was discharged to CIR but has since returned home.  - Overall, she appears to be progressing well. Says breathing has significantly improved since recent admission and she denies any exertional chest pain. Incisional  pain continues to improve on a daily basis. - currently on ASA 325mg  daily. Will send a staff message to Dr. Cyndia Bent to gather his input on switching to DAPT in the  setting of her presenting ACS event. Will recheck CBC today to reassess Hgb. Continue Coreg (will further titrate to 12.5mg  BID) and statin therapy. Will need repeat FLP and LFT's at the time of her next office visit.   2. Chronic Combined Systolic and Diastolic CHF/ Ischemic Cardiomyopathy - Echo during recent hospitalization showed a reduced EF of 35-40%. She denies any recent changes in her respiratory status and says weight has continued to decline. She has trace edema on examination today but lungs are clear. - We will further titrate Coreg from 6.25mg  BID to 12.5mg  BID in the setting of her cardiomyopathy and tachycardia. Consider the addition of ACE-I/ARB at the time of her next visit if BP allows. Will need a repeat echo in 3 months following medical therapy and recent revascularization to reassess EF.  - Will recheck BMET today to assess K+ and kidney function. Continue Lasix at 20mg  daily.   3. Anemia - Hgb 9.3 at the time of recent hospital discharge. She denies any evidence of active bleeding.  - will recheck CBC today and consider initiation of DAPT pending results as outlined above.   4. History of CVA - Noted to have residual right arm paralysis. - continues to work with PT and OT.    Medication Adjustments/Labs and Tests Ordered: Current medicines are reviewed at length with the patient today.  Concerns regarding medicines are outlined above.  Medication changes, Labs and Tests ordered today are listed in the Patient Instructions below. Patient Instructions  Medication Instructions:  Your physician has recommended you make the following change in your medication:  Start Coreg 12.5 mg Two Times Daily    Labwork: Your physician recommends that you return for lab work in: Today    Testing/Procedures: NONE   Follow-Up: Your physician recommends that you schedule a follow-up appointment in: 5-6 Weeks with Dr. Domenic Polite  Any Other Special Instructions Will Be Listed Below  (If Applicable).  If you need a refill on your cardiac medications before your next appointment, please call your pharmacy. Thank you for choosing [facility]!     Signed, Erma Heritage, PA-C  12/16/2017 9:17 AM    [facility] [facility]. [address] [place], [place] [address] Phone: [phone]

## 2017-12-15 ENCOUNTER — Other Ambulatory Visit (HOSPITAL_COMMUNITY)
Admission: RE | Admit: 2017-12-15 | Discharge: 2017-12-15 | Disposition: A | Discharge status: Home / Self Care | Payer: BLUE CROSS/BLUE SHIELD | Source: Other Acute Inpatient Hospital | Attending: Student | Admitting: Student

## 2017-12-15 ENCOUNTER — Ambulatory Visit: Payer: BLUE CROSS/BLUE SHIELD | Admitting: Student

## 2017-12-15 ENCOUNTER — Encounter: Payer: Self-pay | Admitting: Student

## 2017-12-15 VITALS — BP 148/84 | HR 120 | Ht 61.0 in | Wt 122.4 lb

## 2017-12-15 DIAGNOSIS — Z79899 Other long term (current) drug therapy: Secondary | ICD-10-CM | POA: Insufficient documentation

## 2017-12-15 DIAGNOSIS — I5042 Chronic combined systolic (congestive) and diastolic (congestive) heart failure: Secondary | ICD-10-CM | POA: Diagnosis not present

## 2017-12-15 DIAGNOSIS — I2510 Atherosclerotic heart disease of native coronary artery without angina pectoris: Secondary | ICD-10-CM | POA: Diagnosis not present

## 2017-12-15 DIAGNOSIS — Z951 Presence of aortocoronary bypass graft: Secondary | ICD-10-CM

## 2017-12-15 DIAGNOSIS — I255 Ischemic cardiomyopathy: Secondary | ICD-10-CM | POA: Diagnosis not present

## 2017-12-15 DIAGNOSIS — Z8673 Personal history of transient ischemic attack (TIA), and cerebral infarction without residual deficits: Secondary | ICD-10-CM | POA: Diagnosis not present

## 2017-12-15 DIAGNOSIS — D649 Anemia, unspecified: Secondary | ICD-10-CM | POA: Diagnosis not present

## 2017-12-15 LAB — CBC WITH DIFFERENTIAL/PLATELET
BASOS PCT: 1 %
Basophils Absolute: 0 10*3/uL (ref 0.0–0.1)
EOS ABS: 0.5 10*3/uL (ref 0.0–0.7)
EOS PCT: 6 %
HEMATOCRIT: 32.5 % — AB (ref 36.0–46.0)
Hemoglobin: 10 g/dL — ABNORMAL LOW (ref 12.0–15.0)
Lymphocytes Relative: 31 %
Lymphs Abs: 2.7 10*3/uL (ref 0.7–4.0)
MCH: 27.9 pg (ref 26.0–34.0)
MCHC: 30.8 g/dL (ref 30.0–36.0)
MCV: 90.8 fL (ref 78.0–100.0)
MONO ABS: 0.9 10*3/uL (ref 0.1–1.0)
MONOS PCT: 11 %
Neutro Abs: 4.5 10*3/uL (ref 1.7–7.7)
Neutrophils Relative %: 51 %
PLATELETS: 606 10*3/uL — AB (ref 150–400)
RBC: 3.58 MIL/uL — ABNORMAL LOW (ref 3.87–5.11)
RDW: 15 % (ref 11.5–15.5)
WBC: 8.7 10*3/uL (ref 4.0–10.5)

## 2017-12-15 LAB — BASIC METABOLIC PANEL
Anion gap: 12 (ref 5–15)
BUN: 19 mg/dL (ref 6–20)
CALCIUM: 9.2 mg/dL (ref 8.9–10.3)
CO2: 24 mmol/L (ref 22–32)
CREATININE: 0.68 mg/dL (ref 0.44–1.00)
Chloride: 105 mmol/L (ref 101–111)
GFR calc Af Amer: >60 mL/min (ref >60)
GFR calc non Af Amer: >60 mL/min (ref >60)
GLUCOSE: 92 mg/dL (ref 65–99)
Potassium: 3.7 mmol/L (ref 3.5–5.1)
Sodium: 141 mmol/L (ref 135–145)

## 2017-12-15 NOTE — Patient Instructions (Signed)
Medication Instructions:  Your physician has recommended you make the following change in your medication:  Start Coreg 12.5 mg Two Times Daily    Labwork: Your physician recommends that you return for lab work in: Today    Testing/Procedures: NONE   Follow-Up: Your physician recommends that you schedule a follow-up appointment in: 5-6 Weeks with Dr. Domenic Polite   Any Other Special Instructions Will Be Listed Below (If Applicable).     If you need a refill on your cardiac medications before your next appointment, please call your pharmacy. Thank you for choosing [facility]!

## 2017-12-16 ENCOUNTER — Encounter: Payer: Self-pay | Admitting: Student

## 2017-12-16 DIAGNOSIS — I5042 Chronic combined systolic (congestive) and diastolic (congestive) heart failure: Secondary | ICD-10-CM | POA: Insufficient documentation

## 2017-12-16 DIAGNOSIS — I5032 Chronic diastolic (congestive) heart failure: Secondary | ICD-10-CM | POA: Insufficient documentation

## 2017-12-17 ENCOUNTER — Encounter: Payer: Self-pay | Admitting: Physical Therapy

## 2017-12-17 NOTE — Therapy (Deleted)
[facility] [address] [place], [place], [address] Phone: [phone]   Fax:  [phone]  Patient Details  Name: [name] MRN: [id number] Date of Birth: [birth year] Referring Provider:  No ref. provider found  Encounter Date: 12/17/2017  PHYSICAL THERAPY DISCHARGE SUMMARY  Visits from Start of Care: 7  Current functional level related to goals / functional outcomes: Unable to assess as did not return after 7th visit due to hospital admission with CABG   Remaining deficits: See last progress note 11/15/17   Education / Equipment: Unable to assess  Plan: Patient agrees to discharge.  Patient goals were not met. Patient is being discharged due to a change in medical status.  ?????       Rexanne Mano, PT 12/17/2017, 9:30 AM  [facility] [address] [place], [place], [address] Phone: [phone]   Fax:  [phone]

## 2017-12-18 ENCOUNTER — Ambulatory Visit: Payer: BLUE CROSS/BLUE SHIELD | Admitting: Family Medicine

## 2017-12-18 ENCOUNTER — Encounter: Payer: Self-pay | Admitting: Family Medicine

## 2017-12-18 VITALS — BP 121/74 | HR 100 | Temp 97.7°F | Resp 20 | Ht 61.0 in | Wt 122.0 lb

## 2017-12-18 DIAGNOSIS — I63512 Cerebral infarction due to unspecified occlusion or stenosis of left middle cerebral artery: Secondary | ICD-10-CM

## 2017-12-18 DIAGNOSIS — I214 Non-ST elevation (NSTEMI) myocardial infarction: Secondary | ICD-10-CM | POA: Diagnosis not present

## 2017-12-18 DIAGNOSIS — D62 Acute posthemorrhagic anemia: Secondary | ICD-10-CM

## 2017-12-18 DIAGNOSIS — I5042 Chronic combined systolic (congestive) and diastolic (congestive) heart failure: Secondary | ICD-10-CM | POA: Diagnosis not present

## 2017-12-18 DIAGNOSIS — I69391 Dysphagia following cerebral infarction: Secondary | ICD-10-CM

## 2017-12-18 DIAGNOSIS — I10 Essential (primary) hypertension: Secondary | ICD-10-CM

## 2017-12-18 DIAGNOSIS — J9601 Acute respiratory failure with hypoxia: Secondary | ICD-10-CM | POA: Diagnosis not present

## 2017-12-18 DIAGNOSIS — G8191 Hemiplegia, unspecified affecting right dominant side: Secondary | ICD-10-CM | POA: Diagnosis not present

## 2017-12-18 DIAGNOSIS — Z951 Presence of aortocoronary bypass graft: Secondary | ICD-10-CM | POA: Diagnosis not present

## 2017-12-18 DIAGNOSIS — I5023 Acute on chronic systolic (congestive) heart failure: Secondary | ICD-10-CM

## 2017-12-18 DIAGNOSIS — J690 Pneumonitis due to inhalation of food and vomit: Secondary | ICD-10-CM | POA: Diagnosis not present

## 2017-12-18 DIAGNOSIS — I2510 Atherosclerotic heart disease of native coronary artery without angina pectoris: Secondary | ICD-10-CM | POA: Diagnosis not present

## 2017-12-18 DIAGNOSIS — R000 Tachycardia, unspecified: Secondary | ICD-10-CM

## 2017-12-18 MED ORDER — CARVEDILOL 6.25 MG PO TABS: 6.2500 mg | ORAL_TABLET | Freq: Two times a day (BID) | ORAL | 0 refills | Status: DC

## 2017-12-18 MED ORDER — FUROSEMIDE 20 MG PO TABS
20.0000 mg | ORAL_TABLET | Freq: Every day | ORAL | 1 refills | Status: DC
Start: 2017-12-18 — End: 2017-12-18

## 2017-12-18 MED ORDER — FUROSEMIDE 20 MG PO TABS: 20.0000 mg | ORAL_TABLET | Freq: Every day | ORAL | 1 refills | Status: DC

## 2017-12-18 NOTE — Patient Instructions (Addendum)
1. Ask cardiologist about need for blood thinner. Until then, continue aspirin 325 mg daily.  2. ONLY use coreg 6.25 mg every 12 hours, I have called this in for you. You had been on it in the hospital. [facility] [facility] cardiologist you had NOT been taking this medicine (because you did not get the printed script) after your hospital discharge when she saw you. That is why BP/HR was so high.   3. You should be taking lasix 1 tab (20 mg daily) by cardiology notes. I have refilled this medication for you as well.    4. Tramadol- for pain should be refilled by rehab/Dr.Patel if still needing.   Follow up here in 3-4 months.    Please help [place] help you:  We are honored you have chosen [facility] for your Primary Care home. Below you will find basic instructions that you may need to access in the future. Please help [place] help you by reading the instructions, which cover many of the frequent questions we experience.   Prescription refills and request:  -In order to allow more efficient response time, please call your pharmacy for all refills. They will forward the request electronically to [place]. This allows for the quickest possible response. Request left on a nurse line can take longer to refill, since these are checked as time allows between office patients and other phone calls.  - refill request can take up to 3-5 working days to complete.  - If request is sent electronically and request is appropiate, it is usually completed in 1-2 business days.  - all patients will need to be seen routinely for all chronic medical conditions requiring prescription medications (see follow-up below). If you are overdue for follow up on your condition, you will be asked to make an appointment and we will call in enough medication to cover you until your appointment (up to 30 days).  - all controlled substances will require a face to face visit to request/refill.  - if you desire your prescriptions to go through a new  pharmacy, and have an active script at original pharmacy, you will need to call your pharmacy and have scripts transferred to new pharmacy. This is completed between the pharmacy locations and not by your provider.    Results: If any images or labs were ordered, it can take up to 1 week to get results depending on the test ordered and the lab/facility running and resulting the test. - Normal or stable results, which do not need further discussion, may be released to your mychart immediately with attached note to you. A call may not be generated for normal results. Please make certain to sign up for mychart. If you have questions on how to activate your mychart you can call the front office.  - If your results need further discussion, our office will attempt to contact you via phone, and if unable to reach you after 2 attempts, we will release your abnormal result to your mychart with instructions.  - All results will be automatically released in mychart after 1 week.  - Your provider will provide you with explanation and instruction on all relevant material in your results. Please keep in mind, results and labs may appear confusing or abnormal to the untrained eye, but it does not mean they are actually abnormal for you personally. If you have any questions about your results that are not covered, or you desire more detailed explanation than what was provided, you should make  an appointment with your provider to do so.   Our office handles many outgoing and incoming calls daily. If we have not contacted you within 1 week about your results, please check your mychart to see if there is a message first and if not, then contact our office.  In helping with this matter, you help decrease call volume, and therefore allow [place] to be able to respond to patients needs more efficiently.   Acute office visits (sick visit):  An acute visit is intended for a new problem and are scheduled in shorter time slots to allow  schedule openings for patients with new problems. This is the appropriate visit to discuss a new problem. In order to provide you with excellent quality medical care with proper time for you to explain your problem, have an exam and receive treatment with instructions, these appointments should be limited to one new problem per visit. If you experience a new problem, in which you desire to be addressed, please make an acute office visit, we save openings on the schedule to accommodate you. Please do not save your new problem for any other type of visit, let us take care of it properly and quickly for you.   Follow up visits:  Depending on your condition(s) your provider will need to see you routinely in order to provide you with quality care and prescribe medication(s). Most chronic conditions (Example: hypertension, Diabetes, depression/anxiety... etc), require visits a couple times a year. Your provider will instruct you on proper follow up for your personal medical conditions and history. Please make certain to make follow up appointments for your condition as instructed. Failing to do so could result in lapse in your medication treatment/refills. If you request a refill, and are overdue to be seen on a condition, we will always provide you with a 30 day script (once) to allow you time to schedule.    Medicare wellness (well visit): - we have a wonderful Nurse Maudie Mercury), that will meet with you and provide you will yearly medicare wellness visits. These visits should occur yearly (can not be scheduled less than 1 calendar year apart) and cover preventive health, immunizations, advance directives and screenings you are entitled to yearly through your medicare benefits. Do not miss out on your entitled benefits, this is when medicare will pay for these benefits to be ordered for you.  These are strongly encouraged by your provider and is the appropriate type of visit to make certain you are up to date with all  preventive health benefits. If you have not had your medicare wellness exam in the last 12 months, please make certain to schedule one by calling the office and schedule your medicare wellness with Maudie Mercury as soon as possible.   Yearly physical (well visit):  - Adults are recommended to be seen yearly for physicals. Check with your insurance and date of your last physical, most insurances require one calendar year between physicals. Physicals include all preventive health topics, screenings, medical exam and labs that are appropriate for gender/age and history. You may have fasting labs needed at this visit. This is a well visit (not a sick visit), new problems should not be covered during this visit (see acute visit).  - Pediatric patients are seen more frequently when they are younger. Your provider will advise you on well child visit timing that is appropriate for your their age. - This is not a medicare wellness visit. Medicare wellness exams do not have an exam portion  to the visit. Some medicare companies allow for a physical, some do not allow a yearly physical. If your medicare allows a yearly physical you can schedule the medicare wellness with our nurse Maudie Mercury and have your physical with your provider after, on the same day. Please check with insurance for your full benefits.   Late Policy/No Shows:  - all new patients should arrive 15-30 minutes earlier than appointment to allow [place] time  to  obtain all personal demographics,  insurance information and for you to complete office paperwork. - All established patients should arrive 10-15 minutes earlier than appointment time to update all information and be checked in .  - In our best efforts to run on time, if you are late for your appointment you will be asked to either reschedule or if able, we will work you back into the schedule. There will be a wait time to work you back in the schedule,  depending on availability.  - If you are unable to make it  to your appointment as scheduled, please call 24 hours ahead of time to allow [place] to fill the time slot with someone else who needs to be seen. If you do not cancel your appointment ahead of time, you may be charged a no show fee.

## 2017-12-18 NOTE — Progress Notes (Signed)
[name] , Jul 02, 1961, 57 y.o., female MRN: [id number] Patient Care Team    Relationship Specialty Notifications Start End  [name], DO PCP - General Family Medicine  10/13/17   Satira Sark, MD PCP - Cardiology Cardiology Admissions 12/15/17   Constance Haw, MD Consulting Physician Cardiology  10/13/17   Rosalin Hawking, MD Consulting Physician Neurology  10/13/17   Jamse Arn, MD Consulting Physician Physical Medicine and Rehabilitation  10/13/17   Luanne Bras, MD Consulting Physician Interventional Radiology  10/13/17     Chief Complaint  Patient presents with  . Hospitalization Follow-up     Subjective:  [name]  is a 57 y.o. female presents for hospital follow up after recent admission on November 19, 2017 for primary diagnosis operation pneumonia and  NSTEMI. Patient was discharged to rehabilitation and discharge from rehabilitation December 08 2017. Patients discharge summary has been reviewed, as well as all labs/image studies obtained during hospitalization.   Patients hospital course: Patient was admitted to [facility] for aspiration pneumonia and found to have a troponin peak of 10.5.  She was transferred to [facility] for NSTEMI and cardiac catheterization.  Echocardiogram reduced ejection fraction of 35-40% with diffuse hypokinesis and akinesis of the basal mid inferior lateral, inferior and inferior septal myocardium.  She did undergo a cardiac catheterization on November 20, 2017 and showed severe left main disease with 75% stenosis along with 100% proximal Lcx stenosis, 40% ostial LAD stenosis and 90% proximal RCA stenosis.  Audio thoracic surgery was consulted and she underwent CABG on November 23, 2017 with LIMA-LAD and SVG-OM.  Continue to heal well, normal sinus rhythm throughout and was discharged to [facility] on November 29, 2017 with medications as followed ASA 325 mg, Coreg 6.25 mg twice daily, Lasix 20 mg daily and Crestor 20 mg  daily. Since hospital discharge patient reports she is still tired.  She is having some pain midline chest but is improving daily.  Has followed with her cardiology office.  He has questions concerning her medications today.  She states she is not been taking Lasix and does not have a prescription for this medication.  She also has not been taking Coreg 6.25 grams twice daily because she did not have a prescription for this medication.  She is concerned because she knows during her cardiology appointment they told her to increase the Coreg dose.  She still has some mild shortness of breath, but this is also improving mostly with exertion.  Denies orthopnea.  She continues to have some mild swelling along her right lower extremity which has been consistent since her CVA (last admission).    Dg Chest 2 View  Result Date: 11/30/2017 CLINICAL DATA:  Shortness of breath, drowsiness. History of coronary artery disease and CABG, peripheral vascular disease, CVA, former smoker. EXAM: CHEST - 2 VIEW COMPARISON:  Portable chest x-ray of November 26, 2017 FINDINGS: The lungs are adequately inflated. The interstitial edema has markedly improved. The pulmonary vascularity is less engorged and more distinct. The cardiac silhouette remains enlarged. The sternal wires are intact. The left hemidiaphragm is better demonstrated. There are coarse lung markings at the left lung base still. IMPRESSION: Improving CHF with interval resolution of interstitial edema. Persistent left lower lobe atelectasis or pneumonia. Electronically Signed   By: David  Martinique M.D.   On: 11/30/2017 12:54   Dg Chest 2 View  Result Date: 11/22/2017 CLINICAL DATA:  Pneumonia. EXAM: CHEST  2  VIEW COMPARISON:  Radiograph of November 17, 2017. FINDINGS: Stable cardiomegaly. No pneumothorax or pleural effusion is noted. Right basilar opacity noted on prior exam appears to have resolved. No acute pulmonary disease is noted. Bony thorax is unremarkable.  IMPRESSION: No active cardiopulmonary disease. Electronically Signed   By: Marijo Conception, M.D.   On: 11/22/2017 11:47     Recent Labs  Lab 12/15/17 1630  HGB 10.0*  HCT 32.5*  WBC 8.7  PLT 606*   CMP Latest Ref Rng & Units 12/15/2017 12/06/2017 12/04/2017  Glucose 65 - 99 mg/dL 92 - 107(H)  BUN 6 - 20 mg/dL 19 - 20  Creatinine 0.44 - 1.00 mg/dL 0.68 0.76 0.70  Sodium 135 - 145 mmol/L 141 - 135  Potassium 3.5 - 5.1 mmol/L 3.7 - 4.6  Chloride 101 - 111 mmol/L 105 - 99(L)  CO2 22 - 32 mmol/L 24 - 27  Calcium 8.9 - 10.3 mg/dL 9.2 - 8.9  Total Protein 6.5 - 8.1 g/dL - - -  Total Bilirubin 0.3 - 1.2 mg/dL - - -  Alkaline Phos 38 - 126 U/L - - -  AST 15 - 41 U/L - - -  ALT 14 - 54 U/L - - -     Depression screen [facility] 2/9 11/10/2017 11/10/2017 10/13/2017 10/05/2017 10/05/2017  Decreased Interest 0 0 0 1 0  Down, Depressed, Hopeless 0 0 0 0 0  PHQ - 2 Score 0 0 0 1 0  Altered sleeping 0 - - 0 -  Tired, decreased energy 0 - - 2 -  Change in appetite 0 - - 0 -  Feeling bad or failure about yourself  0 - - 0 -  Trouble concentrating 0 - - 1 -  Moving slowly or fidgety/restless 0 - - 1 -  Suicidal thoughts 0 - - 0 -  PHQ-9 Score 0 - - 5 -  Difficult doing work/chores Not difficult at all - - Not difficult at all -    Allergies  Allergen Reactions  . Lopressor [Metoprolol Tartrate] Swelling    Angioedema  . Lipitor [Atorvastatin] Diarrhea   Social History   Tobacco Use  . Smoking status: Former Smoker    Packs/day: 0.12    Years: 37.00    Pack years: 4.44    Types: Cigarettes    Last attempt to quit: 08/21/2017    Years since quitting: 0.3  . Smokeless tobacco: Never Used  Substance Use Topics  . Alcohol use: No    Frequency: Never   Past Medical History:  Diagnosis Date  . Arthritis    "knees" (11/16/2017)  . CAD (coronary artery disease)    a. s/p NSTEMI in 10/2017 with cath showing LM disease --> s/p CABG on 11/23/2017 with LIMA-LAD and SVG-OM.   [name] Hay fever   .  Pneumonia 11/16/2017  . Stroke [facility]) 08/23/2017   Left MCA infarct status post TPA and thrombectomy   Past Surgical History:  Procedure Laterality Date  . CORONARY ARTERY BYPASS GRAFT N/A 11/23/2017   Procedure: CORONARY ARTERY BYPASS GRAFTING (CABG) x 2, ON PUMP, USING LEFT INTERNAL MAMMARY ARTERY AND RIGHT GREATER SAPHENOUS VEIN HARVESTED ENDOSCOPICALLY;  Surgeon: Gaye Pollack, MD;  Location: [facility];  Service: Open Heart Surgery;  Laterality: N/A;  . CRYOABLATION     cervical  . IR ANGIO INTRA EXTRACRAN SEL COM CAROTID INNOMINATE UNI R MOD SED  08/21/2017  . IR ANGIO VERTEBRAL SEL SUBCLAVIAN INNOMINATE UNI R MOD SED  08/21/2017  .  IR PERCUTANEOUS ART THROMBECTOMY/INFUSION INTRACRANIAL INC DIAG ANGIO  08/21/2017  . IR RADIOLOGIST EVAL & MGMT  10/30/2017  . LOOP RECORDER INSERTION N/A 08/28/2017   Procedure: LOOP RECORDER INSERTION;  Surgeon: Constance Haw, MD;  Location: [place] CV LAB;  Service: Cardiovascular;  Laterality: N/A;  . RADIOLOGY WITH ANESTHESIA N/A 08/21/2017   Procedure: RADIOLOGY WITH ANESTHESIA;  Surgeon: Luanne Bras, MD;  Location: [facility];  Service: Radiology;  Laterality: N/A;  . RIGHT/LEFT HEART CATH AND CORONARY ANGIOGRAPHY N/A 11/20/2017   Procedure: RIGHT/LEFT HEART CATH AND CORONARY ANGIOGRAPHY;  Surgeon: Jettie Booze, MD;  Location: [facility] CV LAB;  Service: Cardiovascular;  Laterality: N/A;  . TEE WITHOUT CARDIOVERSION N/A 08/28/2017   Procedure: TRANSESOPHAGEAL ECHOCARDIOGRAM (TEE);  Surgeon: Fay Records, MD;  Location: [facility];  Service: Cardiovascular;  Laterality: N/A;  . TEE WITHOUT CARDIOVERSION N/A 11/23/2017   Procedure: TRANSESOPHAGEAL ECHOCARDIOGRAM (TEE);  Surgeon: Gaye Pollack, MD;  Location: [facility];  Service: Open Heart Surgery;  Laterality: N/A;  . TUBAL LIGATION     Family History  Problem Relation Age of Onset  . Heart attack Father   . Hypertension Mother   . Hyperlipidemia Mother   . Diabetes type II Mother     . Hypertension Brother    Allergies as of 12/18/2017      Reactions   Lopressor [metoprolol Tartrate] Swelling   Angioedema   Lipitor [atorvastatin] Diarrhea      Medication List        Accurate as of 12/18/17  2:52 PM. Always use your most recent med list.          aspirin 325 MG EC tablet Take 1 tablet (325 mg total) by mouth daily.   baclofen 10 MG tablet Commonly known as:  LIORESAL Take 0.5 tablets (5 mg total) by mouth 3 (three) times daily.   bisacodyl 5 MG EC tablet Commonly known as:  DULCOLAX Take 2 tablets (10 mg total) by mouth daily as needed for moderate constipation (Give daily if no BM).   carvedilol 6.25 MG tablet Commonly known as:  COREG Take 1 tablet (6.25 mg total) by mouth 2 (two) times daily with a meal.   cetirizine 10 MG tablet Commonly known as:  ZYRTEC Take 10 mg by mouth daily.   docusate sodium 100 MG capsule Commonly known as:  COLACE Take 2 capsules (200 mg total) by mouth daily.   escitalopram 10 MG tablet Commonly known as:  LEXAPRO Take 1 tablet (10 mg total) by mouth daily.   famotidine 20 MG tablet Commonly known as:  PEPCID Take 1 tablet (20 mg total) by mouth 2 (two) times daily.   furosemide 20 MG tablet Commonly known as:  LASIX Take 1 tablet (20 mg total) by mouth daily.   hydrOXYzine 25 MG tablet Commonly known as:  ATARAX/VISTARIL Take 1 tablet (25 mg total) by mouth at bedtime.   levalbuterol 0.63 MG/3ML nebulizer solution Commonly known as:  XOPENEX Take 3 mLs (0.63 mg total) by nebulization every 8 (eight) hours as needed for wheezing or shortness of breath.   potassium chloride 10 MEQ tablet Commonly known as:  K-DUR Take 2 tablets (20 mEq total) by mouth daily.   rosuvastatin 20 MG tablet Commonly known as:  CRESTOR Take 1 tablet (20 mg total) by mouth daily.   traMADol 50 MG tablet Commonly known as:  ULTRAM Take 1 tablet (50 mg total) by mouth every 6 (six) hours as needed for moderate  pain.        All past medical history, surgical history, allergies, family history, immunizations and medications were updated in the EMR today and reviewed under the history and medication portions of their EMR.      ROS: Negative, with the exception of above mentioned in HPI   Objective:  BP 121/74 (BP Location: Left Arm, Patient Position: Sitting, Cuff Size: Normal)   Pulse 100   Temp 97.7 F (36.5 C)   Resp 20   Ht 5\' 1"  (1.549 m)   Wt 122 lb (55.3 kg)   LMP  (LMP Unknown)   SpO2 98%   BMI 23.05 kg/m  Body mass index is 23.05 kg/m. Gen: Afebrile. No acute distress. Nontoxic in appearance, well developed, well nourished.  HENT: AT. [place]. MMM, no oral lesions. Eyes:Pupils Equal Round Reactive to light, Extraocular movements intact,  Conjunctiva without redness, discharge or icterus. Neck/lymp/endocrine: Supple,no lymphadenopathy CV: Regular rhythm, mild tachycardia.  No S3 or S4.  No murmur.  No rubs.  No gallops appreciated.  Sternal incision well healing, no erythema or drainage.  +1 edema right lower extremity. Chest: CTAB, no wheeze or crackles. Good air movement, normal resp effort.  Abd: Soft. NTND. BS present MSK: Right upper extremity/lower extremity deficits from prior CVA. Neuro: Wheelchair. PERLA. EOMi. Alert. Oriented x3  Psych: Normal affect, dress and demeanor. Normal speech. Normal thought content and judgment.    Assessment/Plan: [name] is a 57 y.o. female present for OV for Hospital discharge follow up Acute on chronic systolic heart failure (HCC) Coronary artery disease involving native coronary artery of native heart without angina pectoris Chronic combined systolic and diastolic CHF (congestive heart failure) (HCC)/history of recent CABG/tachycardia/ NSTEMI (non-ST elevated myocardial infarction) ([place]) Benign essential HTN -BP stable today.  Mild tachycardia.  Reviewed med list with patient from CIR discharge instructions and her most recent cardiology  appointment instructions.  Patient brought all of her bottles with her today and they were compared.  Patient has not been taking Lasix or Coreg since her CIR discharge. -Discussed this with her today that this is recommended to take both, and she reports she must not have had a prescription for them. -Coreg: Will fill her Coreg 6.25 mg twice daily for her to restart.  Not encouraged her to take the increased dose as cardiology had asked her to do, because they were under the assumption she was already on the 6.25 mg twice daily and she was not taking that medication at the time of their visit.  Will restart Coreg 6.25 mg twice daily as it was prescribed on her hospital discharge from CIR. -Restart Lasix 20 mg daily, patient also had not been taking this medication secondary to lack of prescription. -Advised her to communicate with her cardiologist office that she was accidentally not taking those medications so that they are aware. -Use Tylenol for pain.  If needing additional pain medication (tramadol) that should come from her cardiologist or patient rehabilitation services. -Continue follow-up with cardiologist and outpatient rehabilitation  Aspiration pneumonia of both lower lobes, unspecified aspiration pneumonia type (HCC)/Dysphagia, post-stroke/ Right hemiplegia (HCC)/Acute respiratory failure with hypoxia (HCC)/Cerebrovascular accident (CVA) due to occlusion of left middle cerebral artery ([place]) Discussed the need for great caution with swallowing.  Swallow study was basically normal during hospitalization.  Aspiration treatment during hospital stay. -Continue follow-up with neurology and outpatient rehabilitation -She needs to continue to work with PT/OT on her right hemiplegia.  Acute blood loss anemia - slowly  improving.     Reviewed expectations re: course of current medical issues.  Discussed self-management of symptoms.  Outlined signs and symptoms indicating need for more acute  intervention.  Patient verbalized understanding and all questions were answered.  Patient received an After-Visit Summary.  Any changes in medications were reviewed and patient was provided with updated med list with their AVS.     > 25 minutes spent with patient, >50% of time spent face to face counseling and coordinating care.    No orders of the defined types were placed in this encounter.    Note is dictated utilizing voice recognition software. Although note has been proof read prior to signing, occasional typographical errors still can be missed. If any questions arise, please do not hesitate to call for verification.   electronically signed by:  Howard Pouch, DO  [place]

## 2017-12-19 ENCOUNTER — Ambulatory Visit: Payer: BLUE CROSS/BLUE SHIELD | Admitting: Physical Therapy

## 2017-12-19 ENCOUNTER — Encounter: Payer: BLUE CROSS/BLUE SHIELD | Admitting: Occupational Therapy

## 2017-12-19 ENCOUNTER — Telehealth: Payer: Self-pay | Admitting: Student

## 2017-12-19 ENCOUNTER — Other Ambulatory Visit: Payer: Self-pay | Admitting: Student

## 2017-12-19 DIAGNOSIS — I480 Paroxysmal atrial fibrillation: Secondary | ICD-10-CM | POA: Insufficient documentation

## 2017-12-19 DIAGNOSIS — I482 Chronic atrial fibrillation, unspecified: Secondary | ICD-10-CM | POA: Insufficient documentation

## 2017-12-19 MED ORDER — ASPIRIN 81 MG PO TBEC: 81.0000 mg | DELAYED_RELEASE_TABLET | Freq: Every day | ORAL | Status: DC

## 2017-12-19 MED ORDER — TICAGRELOR 90 MG PO TABS: 90.0000 mg | ORAL_TABLET | Freq: Two times a day (BID) | ORAL | 3 refills | Status: DC

## 2017-12-19 NOTE — Telephone Encounter (Signed)
Reviewed AF episodes with Dr. Curt Bears, who agrees that ECGs show true AF. Episodes were 63min and 16min in duration and did not transmit due to disconnected monitor.  Spoke with patient regarding findings.  She requests that I speak with her husband.  He is out right now, so she will have him call back.  Will schedule patient for next available f/u appointment with Dr. Curt Bears to discuss [facility].  Per Dr. Curt Bears, will also have patient hold off on starting Brilinta until her visit with him.

## 2017-12-19 NOTE — Telephone Encounter (Signed)
   As outlined in her recent office note, I touched base with Dr. Cyndia Bent and he was in agreement with her being on DAPT with ASA and Brilinta. Will therefore be on Brilinta 90mg  BID and reduce ASA to 81mg  daily upon starting Brilinta. I called the patient to update her and LVM. Was able to reach her husband and updated him on the medication changes and that we will be mailing an updated medication list along with a co-pay card for Brilinta to their home. They are to call with any questions or concerns regarding the medication changes. He voiced understanding of this and was appreciative of the update.   Signed, Erma Heritage, PA-C 12/19/2017, 10:15 AM Pager: [phone]

## 2017-12-19 NOTE — Telephone Encounter (Signed)
Spoke with patient's husband.  Able to assist him with sending a transmission as we still have not received a full report.  Received error code, but able to troubleshoot monitor.  Transmission successful.  14 pause episodes are false--signal dropout during CABG on 11/23/17.  "AF" episodes appear appropriate, occurred on 11/13/17.  ECGs printed and will review with Dr. Curt Bears for recommendations.

## 2017-12-20 ENCOUNTER — Ambulatory Visit: Payer: BLUE CROSS/BLUE SHIELD | Attending: Family Medicine | Admitting: Occupational Therapy

## 2017-12-20 ENCOUNTER — Encounter: Payer: Self-pay | Admitting: Physical Therapy

## 2017-12-20 ENCOUNTER — Ambulatory Visit: Payer: BLUE CROSS/BLUE SHIELD | Admitting: Physical Therapy

## 2017-12-20 ENCOUNTER — Other Ambulatory Visit: Payer: Self-pay

## 2017-12-20 DIAGNOSIS — R600 Localized edema: Secondary | ICD-10-CM

## 2017-12-20 DIAGNOSIS — R2689 Other abnormalities of gait and mobility: Secondary | ICD-10-CM | POA: Insufficient documentation

## 2017-12-20 DIAGNOSIS — R293 Abnormal posture: Secondary | ICD-10-CM

## 2017-12-20 DIAGNOSIS — M6281 Muscle weakness (generalized): Secondary | ICD-10-CM | POA: Diagnosis not present

## 2017-12-20 DIAGNOSIS — R278 Other lack of coordination: Secondary | ICD-10-CM | POA: Insufficient documentation

## 2017-12-20 DIAGNOSIS — I69351 Hemiplegia and hemiparesis following cerebral infarction affecting right dominant side: Secondary | ICD-10-CM | POA: Insufficient documentation

## 2017-12-20 DIAGNOSIS — R2681 Unsteadiness on feet: Secondary | ICD-10-CM | POA: Diagnosis not present

## 2017-12-20 NOTE — Telephone Encounter (Signed)
Spoke with patient's husband.  He is aware of AF findings and is agreeable to scheduling f/u appointment with Dr. Curt Bears to discuss [facility].  Patient is already coming to [place] on 3/28 to see Dr. Posey Pronto and would like to coordinate appointments if possible.  I called Dr. Serita Grit office on behalf of the patient and was able to reschedule her appointment to 3/29 at 1:20pm.  Patient is now scheduled with Dr. Curt Bears on 3/29 at 12:15pm.    Patient has not started Brilinta as she hasn't received the co-pay card in the mail yet (see phone note from [place], [place] on 3/26).  Advised patient's husband to wait on filling this medication until after her appointment with Dr. Curt Bears.  He verbalizes understanding.  Patient's husband is very appreciative of assistance and coordination of care efforts and denies additional questions at this time.

## 2017-12-20 NOTE — Therapy (Signed)
[facility] [address] [place], [place], [address] Phone: [phone]   Fax:  [phone]  Physical Therapy Evaluation  Patient Details  Name: [name] MRN: [id number] Date of Birth: [birth year] Referring Provider: Delice Lesch MD   Encounter Date: 12/20/2017  PT End of Session - 12/20/17 2015    Visit Number  1    Number of Visits  16    Authorization Type  [employer]    Authorization Time Period  Unclear pt's current visit count for 2019 (or visit limit--?24 or 30). 3/27 left message for insurance specialist to assist with clarification    Authorization - Visit Number  1 7 prior OPPT visits in 2019    Authorization - Number of Visits  -- 12/20/17 unclear (see above)    PT Start Time  1103    PT Stop Time  1153    PT Time Calculation (min)  50 min    Activity Tolerance  Patient tolerated treatment well    Behavior During Therapy  WFL for tasks assessed/performed       Past Medical History:  Diagnosis Date  . Arthritis    "knees" (11/16/2017)  . CAD (coronary artery disease)    a. s/p NSTEMI in 10/2017 with cath showing LM disease --> s/p CABG on 11/23/2017 with LIMA-LAD and SVG-OM.   [name] Hay fever   . Pneumonia 11/16/2017  . 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 CORONARY ARTERY BYPASS GRAFT N/A 11/23/2017   Procedure: CORONARY ARTERY BYPASS GRAFTING (CABG) x 2, ON PUMP, USING LEFT INTERNAL MAMMARY ARTERY AND RIGHT GREATER SAPHENOUS VEIN HARVESTED ENDOSCOPICALLY;  Surgeon: Gaye Pollack, MD;  Location: [facility];  Service: Open Heart Surgery;  Laterality: N/A;  . CRYOABLATION     cervical  . IR ANGIO INTRA EXTRACRAN SEL COM CAROTID INNOMINATE UNI R MOD SED  08/21/2017  . IR ANGIO VERTEBRAL SEL SUBCLAVIAN INNOMINATE UNI R MOD SED  08/21/2017  . IR PERCUTANEOUS ART THROMBECTOMY/INFUSION INTRACRANIAL INC DIAG ANGIO   08/21/2017  . IR RADIOLOGIST EVAL & MGMT  10/30/2017  . LOOP RECORDER INSERTION N/A 08/28/2017   Procedure: LOOP RECORDER INSERTION;  Surgeon: Constance Haw, MD;  Location: [facility];  Service: Cardiovascular;  Laterality: N/A;  . RADIOLOGY WITH ANESTHESIA N/A 08/21/2017   Procedure: RADIOLOGY WITH ANESTHESIA;  Surgeon: Luanne Bras, MD;  Location: [facility];  Service: Radiology;  Laterality: N/A;  . RIGHT/LEFT HEART CATH AND CORONARY ANGIOGRAPHY N/A 11/20/2017   Procedure: RIGHT/LEFT HEART CATH AND CORONARY ANGIOGRAPHY;  Surgeon: Jettie Booze, MD;  Location: [facility];  Service: Cardiovascular;  Laterality: N/A;  . TEE WITHOUT CARDIOVERSION N/A 08/28/2017   Procedure: TRANSESOPHAGEAL ECHOCARDIOGRAM (TEE);  Surgeon: Fay Records, MD;  Location: [facility];  Service: Cardiovascular;  Laterality: N/A;  . TEE WITHOUT CARDIOVERSION N/A 11/23/2017   Procedure: TRANSESOPHAGEAL ECHOCARDIOGRAM (TEE);  Surgeon: Gaye Pollack, MD;  Location: [facility];  Service: Open Heart Surgery;  Laterality: N/A;  . TUBAL LIGATION      There were no vitals filed for this visit.   Subjective Assessment - 12/20/17 1932    Subjective  I feel like I've lost so much ground since my surgery. I know my walking has slowed.     Patient is accompained by:  Family member husband, [name]    Pertinent History  ischemic cardiomyopathy  with CHF and EF 35-40%;  lt CVA 11/18; aspiration pneumonia with hospitalization 10/2017; CABG 11/23/17; arthritis (esp knees)    Limitations  Lifting;Walking    Patient Stated Goals  walk with more coordination and without an assistive device    Currently in Pain?  Yes    Pain Score  3     Pain Location  Sternum described pain area of incision; not angina-like    Pain Orientation  Medial    Pain Descriptors / Indicators  Aching;Sore    Pain Type  Surgical pain    Pain Onset  1 to 4 weeks ago    Pain Frequency  Intermittent    Aggravating Factors   worst in the  morning    Pain Relieving Factors  medication; repositioning    Effect of Pain on Daily Activities  more effected by sternal precautions         [facility] PT Assessment - 12/20/17 1113      Assessment   Medical Diagnosis  s/p CABG x2, aspiration pneumonia Lt MCA CVA 08/21/2017    Referring Provider  Delice Lesch MD    Onset Date/Surgical Date  11/23/17    Hand Dominance  Right    Next MD Visit  12/21/17    Prior Therapy  CIR 3/6/ - 12/08/17      Precautions   Precautions  Sternal;Fall    Precaution Comments   LOOP recorder      Restrictions   Weight Bearing Restrictions  No      Balance Screen   Has the patient fallen in the past 6 months  No    Has the patient had a decrease in activity level because of a fear of falling?   Yes    Is the patient reluctant to leave their home because of a fear of falling?   No      Home Environment   Living Environment  Private residence    Living Arrangements  Spouse/significant other    Available Help at Discharge  Family;Available 24 hours/day    Type of [place] to enter    Entrance Stairs-Number of Steps  2    Entrance Stairs-Rails  None    Home Layout  One level    [place] - quad;Walker - 2 wheels      Prior Function   Level of Independence  Independent prior to CVA Nov 2018; was still in [place] for CVA when CABG    Vocation  Works at home    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), and working on the farm (tobacco and cattle)    Leisure  play with the Economist; Psychologist, occupational at [employer]; sell antiques      Cognition   Overall Cognitive Status  Within Functional Limits for tasks assessed    Behaviors  Lability tearful as discussing visit limit and deciding between PT/OT      Observation/Other Assessments   Observations  well-groomed; ambulating with [facility] and husband close by      Observation/Other Assessments-Edema    Edema  Circumferential  *Pt with increased edema  RLE (primarily at ankle level); Well's criteria for DVT=3 (high risk for DVT; noted MD was aware prior to CABG and ordered vascular studies (unable to find results; per pt/husband MD now feels edema related to harvested veins RLE)     Circumferential Edema   Circumferential - Right  10 cm below  tibia 32.5 cm; ankle visibly larger than left; pitting edema lower shin/ankle    Circumferential - Left   10 cm below tibial tuberosity 32.5 cm; no edema at ankle      Sensation   Light Touch  Appears Intact    Additional Comments  pt reports small area of numbness at medial rt knee around incision from vein harvest      Posture/Postural Control   Posture/Postural Control  Postural limitations    Postural Limitations  -- rt shoulder lower, more rounded    Posture Comments  due to weakness of RUE      ROM / Strength   AROM / PROM / Strength  AROM;Strength      AROM   Overall AROM   Within functional limits for tasks performed    Overall AROM Comments  bil LEs      PROM   Overall PROM Comments  --      Strength   Overall Strength  Deficits    Right Hip Flexion  4/5    Left Hip Flexion  4/5    Right Knee Flexion  4/5    Right Knee Extension  4+/5    Left Knee Flexion  5/5    Left Knee Extension  4+/5    Right Ankle Dorsiflexion  4/5    Left Ankle Dorsiflexion  5/5      Transfers   Transfers  Sit to Stand;Stand to Sit;Stand Pivot Transfers    Sit to Stand  6: Modified independent (Device/Increase time);Without upper extremity assist to [facility]    Stand to Sit  5: Supervision;Without upper extremity assist;Uncontrolled descent    Stand Pivot Transfers  6: Modified independent (Device/Increase time)      Ambulation/Gait   Ambulation/Gait  Yes    Ambulation/Gait Assistance  4: Min guard;5: Supervision    Ambulation/Gait Assistance Details  closeguarding initially for safety, however pt with very deliberate controlled movements with no imbalance noted with use of SBQC    Ambulation Distance  (Feet)  50 Feet 75    Assistive device  Small based quad cane    Gait Pattern  Step-through pattern;Decreased stride length;Decreased hip/knee flexion - right;Lateral trunk lean to left;Poor foot clearance - right;Right foot flat;Left foot flat    Ambulation Surface  Indoor    Gait velocity  0.71 ft/sec      Berg Balance Test   Sit to Stand  Able to stand without using hands and stabilize independently    Standing Unsupported  Able to stand safely 2 minutes    Sitting with Back Unsupported but Feet Supported on Floor or Stool  Able to sit safely and securely 2 minutes    Stand to Sit  Sits safely with minimal use of hands    Transfers  Able to transfer with verbal cueing and /or supervision    Standing Unsupported with Eyes Closed  Able to stand 10 seconds safely    Standing Ubsupported with Feet Together  Able to place feet together independently and stand 1 minute safely    From Standing, Reach Forward with Outstretched Arm  Can reach confidently >25 cm (10")    From Standing Position, Pick up Object from Floor  Able to pick up shoe safely and easily    From Standing Position, Turn to Look Behind Over each Shoulder  Looks behind from both sides and weight shifts well    Turn 360 Degrees  Needs close supervision or verbal cueing  Standing Unsupported, Alternately Place Feet on Step/Stool  Needs assistance to keep from falling or unable to try    Standing Unsupported, One Foot in Front  Able to plae foot ahead of the other independently and hold 30 seconds    Standing on One Leg  Able to lift leg independently and hold equal to or more than 3 seconds    Total Score  44      Timed Up and Go Test   Normal TUG (seconds)  28.16      RUE Tone   RUE Tone  --      RUE Tone   Hypertonic Details  --    Hypotonic Details  --                Objective measurements completed on examination: See above findings.              PT Education - 12/20/17 1956    Education  Details  increased risk of DVT; signs/symptoms of PE and emergency action needed; assessment results compared to last measures 09/2017 OPPT evaluation; PT POC and may need to change based on visit limit by insurance    Person(s) Educated  Patient;Spouse    Methods  Explanation    Comprehension  Verbalized understanding       PT Short Term Goals - 12/20/17 2118      PT SHORT TERM GOAL #1   Title  verbalize understanding of sternal precautions and CVA risk factors/warning signs (Target all STGs 01/19/18)    Time  4    Period  Weeks    Status  New    Target Date  01/19/18      PT SHORT TERM GOAL #2   Title  amb >= 350' with LRAD on indoor/paved outdoor surfaces modified independent for improved function    Time  4    Period  Weeks    Status  New      PT SHORT TERM GOAL #3   Title  improve timed up and go to < 25 sec with LRAD for improved mobility    Time  4    Period  Weeks    Status  New      PT SHORT TERM GOAL #4   Title  improve gait velocity to at least 1.0 ft/sec for improved mobility    Time  4    Period  Weeks    Status  New        PT Long Term Goals - 12/20/17 2122      PT LONG TERM GOAL #1   Title  independent with HEP (Target all LTGs 02/18/2018)    Time  8    Period  Weeks    Status  New    Target Date  02/18/18      PT LONG TERM GOAL #2   Title  improve BERG balance score to >/= 49/56 for improved balance and decreased fall risk    Time  8    Period  Weeks    Status  New      PT LONG TERM GOAL #3   Title  improve timed up and go to < 20 sec for improved mobility    Time  8    Period  Weeks    Status  New      PT LONG TERM GOAL #4   Title  improve gait velocity to > 1.8 ft/sec for decreased fall risk  Time  8    Period  Weeks    Status  New      PT LONG TERM GOAL #5   Title  amb > 500' on various indoor/outdoor surfaces modified independent with LRAD for 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 Patient was receiving therapy s/p Lt MCA CVA 07/2017 when hospitalized with apiration pneumonia and subsequent CABG. SHe continues to have decreased safety and increased risk of falls due to the deficits listed below. She can benefit from PT via the interventions listed below to increase her independence and safety.     History and Personal Factors relevant to plan of care:  PMH-ischemic cardiomyopathy with CHF and EF 35-40%; lt CVA 11/18; aspiration pneumonia with hospitalization 10/2017, arthritis (esp knees); CABG 11/23/17; Personal factors-emotional responses (lability & depression)    Clinical Presentation  Evolving    Clinical Presentation due to:  fall risk, sternal precautions, CVA <5 months ago    Clinical Decision Making  Moderate    Rehab Potential  Good    PT Frequency  2x / week may need to adjust based on available remaining visits    PT Duration  8 weeks may need to adjust based on available remaining visits    PT Treatment/Interventions  ADLs/Self Care Home Management;Electrical Stimulation;Therapeutic exercise;Therapeutic activities;Functional mobility training;Stair training;Gait training;DME Instruction;Neuromuscular re-education;Balance training;Patient/family education;Orthotic Fit/Training;Manual techniques;Vestibular;Taping;Aquatic Therapy;Passive range of motion    PT Next Visit Plan CHECK appt notes for clarification re: visit limit and adjust plan/frequency as needed (and coordinate with OT) *continue sternal precautions for total 3 months per pt/husband; continue with strengthen R hip IR, glutes, hamstring.  SLS and weight shifting.  RLE NMR, ?continue with use of foot up brace, ? need for additional knee brace    Consulted and Agree with Plan of Care  Patient;Family member/caregiver    Family Member Consulted  husband       Patient will  benefit from skilled therapeutic intervention in order to improve the following deficits and impairments:  Abnormal gait, Decreased strength, Decreased balance, Difficulty walking, Decreased mobility, Impaired perceived functional ability, Decreased activity tolerance, Decreased endurance, Decreased knowledge of use of DME, Increased edema, Impaired UE functional use  Visit Diagnosis: Muscle weakness (generalized) - Plan: PT plan of care cert/re-cert  Abnormal posture - Plan: PT plan of care cert/re-cert  Unsteadiness on feet - Plan: PT plan of care cert/re-cert  Other abnormalities of gait and mobility - Plan: PT plan of care cert/re-cert     Problem List Patient Active Problem List   Diagnosis Date Noted  . Paroxysmal atrial fibrillation ([place]) 12/19/2017  . Chronic combined systolic and diastolic CHF (congestive heart failure) ([place]) 12/16/2017  . Debility 11/29/2017  . Benign essential HTN   . Acute blood loss anemia   . Tachycardia   . Hyperglycemia   . Supplemental oxygen dependent   . 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 Coronary artery disease 11/23/2017  . Acute on chronic systolic heart failure ([place]) 11/21/2017  . Acute respiratory failure with hypoxia ([place]) 11/16/2017  . Aspiration pneumonia ([place]) 11/16/2017  . NSTEMI (non-ST elevated myocardial infarction) ([place]) 11/16/2017  . Stress and adjustment reaction 10/13/2017  . Urticaria 10/13/2017  .  Former smoker 10/13/2017  . Cerebrovascular accident (CVA) due to occlusion of left middle cerebral artery ([place])   . Right hemiplegia ([place])   . Dysphagia, post-stroke   . Hyperlipidemia     Rexanne Mano, PT 12/20/2017, 9:30 PM  [facility] [address] [place], [place], [address] Phone: [phone]   Fax:  [phone]  Name: [name] MRN: [id number] Date of Birth: Feb 22, 1961

## 2017-12-21 ENCOUNTER — Ambulatory Visit: Payer: BLUE CROSS/BLUE SHIELD | Admitting: Physical Therapy

## 2017-12-21 ENCOUNTER — Encounter: Payer: BLUE CROSS/BLUE SHIELD | Admitting: Occupational Therapy

## 2017-12-21 ENCOUNTER — Encounter: Payer: BLUE CROSS/BLUE SHIELD | Admitting: Physical Medicine & Rehabilitation

## 2017-12-21 NOTE — Therapy (Signed)
[facility] [address] [place], [place], [address] Phone: [phone]   Fax:  [phone]  Occupational Therapy Evaluation  Patient Details  Name: [name] MRN: [id number] Date of Birth: May 23, 1961 Referring Provider: Delice Lesch MD   Encounter Date: 12/20/2017  OT End of Session - 12/20/17 1847    Visit Number  1    Number of Visits  17    Date for OT Re-Evaluation  02/19/18    Authorization Type  BC/BS    Authorization - Visit Number  8    Authorization - Number of Visits  16    OT Start Time  5188    OT Stop Time  1100    OT Time Calculation (min)  45 min    Activity Tolerance  Patient tolerated treatment well       Past Medical History:  Diagnosis Date  . Arthritis    "knees" (11/16/2017)  . CAD (coronary artery disease)    a. s/p NSTEMI in 10/2017 with cath showing LM disease --> s/p CABG on 11/23/2017 with LIMA-LAD and SVG-OM.   [name] Hay fever   . Pneumonia 11/16/2017  . 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 CORONARY ARTERY BYPASS GRAFT N/A 11/23/2017   Procedure: CORONARY ARTERY BYPASS GRAFTING (CABG) x 2, ON PUMP, USING LEFT INTERNAL MAMMARY ARTERY AND RIGHT GREATER SAPHENOUS VEIN HARVESTED ENDOSCOPICALLY;  Surgeon: Gaye Pollack, MD;  Location: [facility];  Service: Open Heart Surgery;  Laterality: N/A;  . CRYOABLATION     cervical  . IR ANGIO INTRA EXTRACRAN SEL COM CAROTID INNOMINATE UNI R MOD SED  08/21/2017  . IR ANGIO VERTEBRAL SEL SUBCLAVIAN INNOMINATE UNI R MOD SED  08/21/2017  . IR PERCUTANEOUS ART THROMBECTOMY/INFUSION INTRACRANIAL INC DIAG ANGIO  08/21/2017  . IR RADIOLOGIST EVAL & MGMT  10/30/2017  . LOOP RECORDER INSERTION N/A 08/28/2017   Procedure: LOOP RECORDER INSERTION;  Surgeon: Constance Haw, MD;  Location: [facility];  Service: Cardiovascular;  Laterality: N/A;  .  RADIOLOGY WITH ANESTHESIA N/A 08/21/2017   Procedure: RADIOLOGY WITH ANESTHESIA;  Surgeon: Luanne Bras, MD;  Location: [facility];  Service: Radiology;  Laterality: N/A;  . RIGHT/LEFT HEART CATH AND CORONARY ANGIOGRAPHY N/A 11/20/2017   Procedure: RIGHT/LEFT HEART CATH AND CORONARY ANGIOGRAPHY;  Surgeon: Jettie Booze, MD;  Location: [facility];  Service: Cardiovascular;  Laterality: N/A;  . TEE WITHOUT CARDIOVERSION N/A 08/28/2017   Procedure: TRANSESOPHAGEAL ECHOCARDIOGRAM (TEE);  Surgeon: Fay Records, MD;  Location: [facility];  Service: Cardiovascular;  Laterality: N/A;  . TEE WITHOUT CARDIOVERSION N/A 11/23/2017   Procedure: TRANSESOPHAGEAL ECHOCARDIOGRAM (TEE);  Surgeon: Gaye Pollack, MD;  Location: [facility];  Service: Open Heart Surgery;  Laterality: N/A;  . TUBAL LIGATION      There were no vitals filed for this visit.  Subjective Assessment - 12/20/17 1029    Pertinent History  recent CABG x 2 11/23/17, Lt CVA 07/2017. PMH: CAD, HTN, MI    Limitations  sternal precautions including no lifting > 5 lbs, *loop recorder, fall risk, no driving    Patient Stated Goals  Get my arm working    Currently in Pain?  Yes    Pain Score  3     Pain Location  Sternum    Pain Orientation  Anterior    Pain Descriptors / Indicators  Aching    Pain Type  Surgical pain    Pain Onset  1 to 4 weeks ago    Pain Frequency  Intermittent    Aggravating Factors   In the am    Pain Relieving Factors  pain meds        [facility] OT Assessment - 12/21/17 0001      Assessment   Medical Diagnosis  s/p CABG x2, pneumonia Lt MCA CVA 08/21/2017    Onset Date/Surgical Date  11/23/17    Hand Dominance  Right    Next MD Visit  12/21/17    Prior Therapy  CIR 3/6/ - 12/08/17      Precautions   Precautions  Sternal;Fall    Precaution Comments  FALL RISK, no driving, LOOP recorder      Home  Environment   Bathroom Shower/Tub  Tub/Shower unit;Curtain tub bench    Home Equipment  Cane  -quad;Bedside commode;Tub bench;Wheelchair - manual    Additional Comments  Pt lives with husband in 1 story home, with 2 steps to enter    Lives With  Spouse      Prior Function   Level of Independence  Independent prior to stroke in Nov 2018    Vocation  Works at home    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), and working on the farm (tobacco and cattle)    Leisure  play with the Economist; Psychologist, occupational at [employer]; sell antiques      ADL   Eating/Feeding  Needs assist with cutting food using Lt non dominant hand    Grooming  Modified independent using Lt non dominant hand    Upper Body Bathing  Supervision/safety w/ [place] Sponge    Lower Body Bathing  Supervision/safety with [place] sponge    Upper Body Dressing  Increased time assist only with jacket    Lower Body Dressing  Modified independent using slip on shoes, elastic pants    Toilet Transfer  Modified independent    [place] Transfer  Supervision/safety      IADL   Shopping  Completely unable to shop    Light Housekeeping  Does not participate in any housekeeping tasks    Meal Prep  Needs to have meals prepared and served    [employer] on family or friends for transportation    Medication Management  Is responsible for taking medication in correct dosages at correct time    Financial Management  Requires assistance      Mobility   Mobility Status Comments  walks w/ quad cane      Written Expression   Handwriting  -- unable with Rt hand, signing name with Lt hand if needed      Vision - History   Baseline Vision  Wears glasses only for reading      Vision Assessment   Comment  Reports increased bluriness from meds since CABG      Activity Tolerance   Activity Tolerance  -- decreased activity tolerance since CABG      Observation/Other Assessments   Observations  Rt dominant side  hemiplegia, only 1/2 finger width subluxation RT shoulder, walks w/ quad cane, edema Rt hand.       Sensation   Light Touch  Appears Intact      Coordination   Gross Motor Movements are Fluid and Coordinated  No    Fine Motor Movements are Fluid and Coordinated  No    9 Hole Peg Test  -- unable to perform Rt hand    Box and Blocks  unable to perform Rt hand      Edema   Edema  Mild to moderate Rt hand      AROM   Overall AROM Comments  Scapula elevation and retraction approx 50-75%. No active ROM against gravity RUE except approx. 40-50% finger flexion. Pt does have some activation at shoulder and elbow with closed chain activities and AA/ROM       PROM   Overall PROM Comments  Rt sh. flexion to approx. 120*, with pain, but could control pain if done slowly. Could not tolerate passive Rt abduction even with LUE crossed over chest to adhere to precautions (pain in shoulder, not in chest). Full P/ROM distally with tightness in wrist extension      RUE Tone   RUE Tone  Mild;Hypertonic;Hypotonic      RUE Tone   Hypertonic Details  increased tone in finger flexors, pt does have full PROM of hand mild tone with supination, moderate wrist ext, finger ext    Hypotonic Details  slight subluxation                        OT Short Term Goals - 12/20/17 1857      OT SHORT TERM GOAL #1   Title  Independent with revised HEP     Time  4    Period  Weeks    Status  Revised    Target Date  01/20/18      OT SHORT TERM GOAL #2   Title  Pt to demo 25 degrees shoulder flexion in prep for low level reaching    Time  4    Period  Weeks    Status  On-going      OT SHORT TERM GOAL #3   Title  Pt to demo 75% finger flexion and 10% finger extension in prep for functional grasp/release    Time  4    Period  Weeks    Status  Revised      OT SHORT TERM GOAL #4   Title  Pt/family to verbalize understanding with edema management strategies Rt hand and positioning for Rt shoulder to  minimize pain    Time  4    Period  Weeks    Status  Revised      OT SHORT TERM GOAL #5   Title  Pt to verbalize understanding with A/E needs and task modifications to increased independence with ADLS/IADLS    Time  4    Period  Weeks    Status  Revised        OT Long Term Goals - 12/20/17 1901      OT LONG TERM GOAL #1   Title  Independent with updated HEP    Time  8    Period  Weeks    Status  New    Target Date  02/19/18      OT LONG TERM GOAL #2   Title  Pt to demo functional use of Rt hand as evidenced by performing 6 blocks on Box & Blocks test    Time  8    Period  Weeks    Status  New      OT LONG TERM GOAL #3   Title  Pt to demo 40  degrees shoulder flexion RUE    Time  8    Period  Weeks    Status  New      OT LONG TERM GOAL #4   Title  Pt to demo 90% finger flexion and 25% finger extension Rt hand    Time  8    Period  Weeks    Status  New      OT LONG TERM GOAL #5   Title  Pt to make sandwich/snack/microwaveable meal consistently at mod I level using A/E PRN    Time  8    Period  Weeks    Status  New      OT LONG TERM GOAL #6   Title  Pt to perform light housework (laundry, dishes) at mod I level    Time  8    Period  Weeks    Status  New            Plan - 12/20/17 1850    Clinical Impression Statement  Pt is a 57 y.o. female who presents today for second admission to outpatient rehab s/p recent CABG x 2 on 11/23/17 and hospitalization on 11/17/07 for acute respiratory failure and pneumonia. While patient was in hospital, MI was found. However, prior to this, pt was being seen in outpatient therapy following Lt MCA CVA with Rt dominant hemiplegia (CVA on 08/21/17). Pt now has sternal precautions from CABG and loop recorder.     Occupational Profile and client history currently impacting functional performance  Recent CABG x 2 11/23/17, CVA 07/2017, MI, CAD    Occupational performance deficits (Please refer to evaluation for details):   ADL's;IADL's;Social Participation;Other    Rehab Potential  Good    OT Frequency  2x / week    OT Duration  8 weeks However, may need to adjust depending on remaining visits and insurance limitations    OT Treatment/Interventions  Self-care/ADL training;Moist Heat;Fluidtherapy;DME and/or AE instruction;Splinting;Aquatic Therapy;Therapeutic activities;Compression bandaging;Other (comment);Therapeutic exercise;Neuromuscular education;Functional Mobility Training;Passive range of motion;Visual/perceptual remediation/compensation;Manual Therapy;Electrical Stimulation;Energy conservation;Patient/family education    Plan  revise/udpate HEP, review sternal precautions, issue more hooks/straps for resting hand splints    Clinical Decision Making  Several treatment options, min-mod task modification necessary    Consulted and Agree with Plan of Care  Patient;Family member/caregiver    Family Member Consulted  husband       Patient will benefit from skilled therapeutic intervention in order to improve the following deficits and impairments:  Decreased coordination, Decreased range of motion, Difficulty walking, Decreased endurance, Impaired tone, Decreased activity tolerance, Decreased knowledge of precautions, Decreased balance, Decreased knowledge of use of DME, Impaired UE functional use, Pain, Decreased mobility, Decreased strength  Visit Diagnosis: Hemiplegia and hemiparesis following cerebral infarction affecting right dominant side ([place]) - Plan: Ot plan of care cert/re-cert  Muscle weakness (generalized) - Plan: Ot plan of care cert/re-cert  Other lack of coordination - Plan: Ot plan of care cert/re-cert  Abnormal posture - Plan: Ot plan of care cert/re-cert  Localized edema - Plan: Ot plan of care cert/re-cert  Unsteadiness on feet - Plan: Ot plan of care cert/re-cert    Problem List Patient Active Problem List   Diagnosis Date Noted  . Paroxysmal atrial fibrillation ([facility]) 12/19/2017   . Chronic combined systolic and diastolic CHF (congestive heart failure) ([facility]) 12/16/2017  . Debility 11/29/2017  . Benign essential HTN   . Acute blood loss anemia   . Tachycardia   . Hyperglycemia   . Supplemental  oxygen dependent   . 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 Coronary artery disease 11/23/2017  . Acute on chronic systolic heart failure ([facility]) 11/21/2017  . Acute respiratory failure with hypoxia ([facility]) 11/16/2017  . Aspiration pneumonia ([facility]) 11/16/2017  . NSTEMI (non-ST elevated myocardial infarction) ([facility]) 11/16/2017  . Stress and adjustment reaction 10/13/2017  . Urticaria 10/13/2017  . Former smoker 10/13/2017  . Cerebrovascular accident (CVA) due to occlusion of left middle cerebral artery ([place])   . Right hemiplegia ([place])   . Dysphagia, post-stroke   . Hyperlipidemia     Carey Bullocks, OTR/L 12/21/2017, 12:33 PM  [facility] [address] [place], [place], [address] Phone: [phone]   Fax:  [phone]  Name: [name] MRN: [id number] Date of Birth: 22-Jun-1961

## 2017-12-22 ENCOUNTER — Ambulatory Visit: Payer: BLUE CROSS/BLUE SHIELD | Admitting: Cardiology

## 2017-12-22 ENCOUNTER — Encounter
Payer: BLUE CROSS/BLUE SHIELD | Attending: Physical Medicine & Rehabilitation | Admitting: Physical Medicine & Rehabilitation

## 2017-12-22 ENCOUNTER — Encounter: Payer: Self-pay | Admitting: Physical Medicine & Rehabilitation

## 2017-12-22 ENCOUNTER — Encounter: Payer: Self-pay | Admitting: Cardiology

## 2017-12-22 VITALS — BP 124/73 | HR 72 | Resp 14

## 2017-12-22 VITALS — BP 122/68 | HR 88 | Ht 61.0 in | Wt 123.4 lb

## 2017-12-22 DIAGNOSIS — Z833 Family history of diabetes mellitus: Secondary | ICD-10-CM | POA: Diagnosis not present

## 2017-12-22 DIAGNOSIS — I10 Essential (primary) hypertension: Secondary | ICD-10-CM | POA: Diagnosis not present

## 2017-12-22 DIAGNOSIS — G8191 Hemiplegia, unspecified affecting right dominant side: Secondary | ICD-10-CM | POA: Diagnosis not present

## 2017-12-22 DIAGNOSIS — Z87891 Personal history of nicotine dependence: Secondary | ICD-10-CM | POA: Insufficient documentation

## 2017-12-22 DIAGNOSIS — Z8249 Family history of ischemic heart disease and other diseases of the circulatory system: Secondary | ICD-10-CM | POA: Insufficient documentation

## 2017-12-22 DIAGNOSIS — I480 Paroxysmal atrial fibrillation: Secondary | ICD-10-CM | POA: Diagnosis not present

## 2017-12-22 DIAGNOSIS — I25810 Atherosclerosis of coronary artery bypass graft(s) without angina pectoris: Secondary | ICD-10-CM | POA: Diagnosis not present

## 2017-12-22 DIAGNOSIS — I639 Cerebral infarction, unspecified: Secondary | ICD-10-CM

## 2017-12-22 DIAGNOSIS — I255 Ischemic cardiomyopathy: Secondary | ICD-10-CM

## 2017-12-22 DIAGNOSIS — R1310 Dysphagia, unspecified: Secondary | ICD-10-CM | POA: Diagnosis not present

## 2017-12-22 DIAGNOSIS — G8110 Spastic hemiplegia affecting unspecified side: Secondary | ICD-10-CM | POA: Diagnosis not present

## 2017-12-22 DIAGNOSIS — I69351 Hemiplegia and hemiparesis following cerebral infarction affecting right dominant side: Secondary | ICD-10-CM | POA: Insufficient documentation

## 2017-12-22 DIAGNOSIS — I63512 Cerebral infarction due to unspecified occlusion or stenosis of left middle cerebral artery: Secondary | ICD-10-CM

## 2017-12-22 DIAGNOSIS — Z888 Allergy status to other drugs, medicaments and biological substances status: Secondary | ICD-10-CM | POA: Insufficient documentation

## 2017-12-22 DIAGNOSIS — I69391 Dysphagia following cerebral infarction: Secondary | ICD-10-CM | POA: Insufficient documentation

## 2017-12-22 DIAGNOSIS — K5900 Constipation, unspecified: Secondary | ICD-10-CM | POA: Insufficient documentation

## 2017-12-22 LAB — CUP PACEART INCLINIC DEVICE CHECK
Implantable Pulse Generator Implant Date: 20181203
MDC IDC SESS DTM: 20190329145415

## 2017-12-22 MED ORDER — CLOPIDOGREL BISULFATE 75 MG PO TABS: 75.0000 mg | ORAL_TABLET | Freq: Every day | ORAL | 2 refills | Status: DC

## 2017-12-22 MED ORDER — RIVAROXABAN 20 MG PO TABS: 20.0000 mg | ORAL_TABLET | Freq: Every day | ORAL | 6 refills | Status: DC

## 2017-12-22 MED ORDER — RIVAROXABAN 20 MG PO TABS: 20.0000 mg | ORAL_TABLET | Freq: Every day | ORAL | 0 refills | Status: DC

## 2017-12-22 NOTE — Therapy (Deleted)
[facility] [address] [place], [place], [address] Phone: [phone]   Fax:  [phone]  Patient Details  Name: [name] MRN: [id number] Date of Birth: [birth year] Referring Provider:  [name], DO  Encounter Date: 12/19/2017   Attempted to speak with patient and husband by phone re: clarification on the number of therapy (PT and OT) visits she has remaining which will be covered by her insurance. Left message on their voicemail as she is currently scheduled to come for her next PT visit Monday 12/25/17.  She currently has used 14 of 30 visits she is allowed for current year. (Has used 7 PT and 7 OT visits with only 16 visits remaining to be used by PT or OT).   Encouraged them to discuss how they would want to utilize these remaining visits and can discuss in person (or by phone) next business day.    Rexanne Mano, PT 12/22/2017, 5:34 PM  [facility] [address] [place], [place], [address] Phone: [phone]   Fax:  [phone]

## 2017-12-22 NOTE — Progress Notes (Signed)
Electrophysiology Office Note   Date:  12/22/2017   ID:  [name], DOB 1961/03/25, MRN [id number]  PCP:  Natalia Leatherwood, DO  Cardiologist:  Diona Browner Primary Electrophysiologist:  Lela Gell Jorja Loa, MD    Chief Complaint  Patient presents with  . Pacemaker Check    Discuss PAF on Linq     History of Present Illness: [name] is a 57 y.o. female who is being seen today for the evaluation of atrial fibrillation at the request of Nona Dell. Presenting today for electrophysiology evaluation.  She has a past history of CVA and tobacco abuse.  She was admitted [facility] 11/16/17 for worsening dyspnea on exertion was found to have a non-STEMI with a peak troponin of 10.5 and was transferred to [facility].  Echo showed an EF of 35-40% with diffuse hypokinesis and akinesis of the basal mid inferior lateral, inferior inferoseptal wall.  Cath showed severe left main disease, circumflex stenosis, ostial LAD and proximal RCA stenosis.  She underwent CABG 11/23/17 with a LIMA to the LAD and vein to the OM.    Today, she denies symptoms of palpitations, chest pain,  orthopnea, PND, lower extremity edema, claudication, dizziness, presyncope, syncope, bleeding, or neurologic sequela. The patient is tolerating medications without difficulties.  She continues to be short of breath since her operation.  She is going to cardiac rehab which has been improving her symptoms.   Past Medical History:  Diagnosis Date  . Arthritis    "knees" (11/16/2017)  . CAD (coronary artery disease)    a. s/p NSTEMI in 10/2017 with cath showing LM disease --> s/p CABG on 11/23/2017 with LIMA-LAD and SVG-OM.   [name] Hay fever   . Pneumonia 11/16/2017  . Stroke [facility]) 08/23/2017   Left MCA infarct status post TPA and thrombectomy   Past Surgical History:  Procedure Laterality Date  . CORONARY ARTERY BYPASS GRAFT N/A 11/23/2017   Procedure: CORONARY ARTERY BYPASS GRAFTING (CABG) x 2, ON PUMP, USING LEFT  INTERNAL MAMMARY ARTERY AND RIGHT GREATER SAPHENOUS VEIN HARVESTED ENDOSCOPICALLY;  Surgeon: Alleen Borne, MD;  Location[facility];  Service: Open Heart Surgery;  Laterality: N/A;  . CRYOABLATION     cervical  . IR ANGIO INTRA EXTRACRAN SEL COM CAROTID INNOMINATE UNI R MOD SED  08/21/2017  . IR ANGIO VERTEBRAL SEL SUBCLAVIAN INNOMINATE UNI R MOD SED  08/21/2017  . IR PERCUTANEOUS ART THROMBECTOMY/INFUSION INTRACRANIAL INC DIAG ANGIO  08/21/2017  . IR RADIOLOGIST EVAL & MGMT  10/30/2017  . LOOP RECORDER INSERTION N/A 08/28/2017   Procedure: LOOP RECORDER INSERTION;  Surgeon: Regan Lemming, MD;  [facility];  Service: Cardiovascular;  Laterality: N/A;  . RADIOLOGY WITH ANESTHESIA N/A 08/21/2017   Procedure: RADIOLOGY WITH ANESTHESIA;  Surgeon: Julieanne Cotton, MD;  Location[facility];  Service: Radiology;  Laterality: N/A;  . RIGHT/LEFT HEART CATH AND CORONARY ANGIOGRAPHY N/A 11/20/2017   Procedure: RIGHT/LEFT HEART CATH AND CORONARY ANGIOGRAPHY;  Surgeon: Corky Crafts, MD;  Location: [facility] INVASIVE [facility] LAB;  Service: Cardiovascular;  Laterality: N/A;  . TEE WITHOUT CARDIOVERSION N/A 08/28/2017   Procedure: TRANSESOPHAGEAL ECHOCARDIOGRAM (TEE);  Surgeon: Pricilla Riffle, MD;  Location: [facility];  Service: Cardiovascular;  Laterality: N/A;  . TEE WITHOUT CARDIOVERSION N/A 11/23/2017   Procedure: TRANSESOPHAGEAL ECHOCARDIOGRAM (TEE);  Surgeon: Alleen Borne, MD;  Location: [facility] OR;  Service: Open Heart Surgery;  Laterality: N/A;  . TUBAL LIGATION       Current  Outpatient Medications  Medication Sig Dispense Refill  . aspirin 81 MG EC tablet Take 1 tablet (81 mg total) by mouth daily.    . baclofen (LIORESAL) 10 MG tablet Take 0.5 tablets (5 mg total) by mouth 3 (three) times daily. 45 tablet 1  . bisacodyl (DULCOLAX) 5 MG EC tablet Take 2 tablets (10 mg total) by mouth daily as needed for moderate constipation (Give daily if no BM). 30 tablet 0  . carvedilol (COREG) 6.25  MG tablet Take 1 tablet (6.25 mg total) by mouth 2 (two) times daily with a meal. 60 tablet 0  . cetirizine (ZYRTEC) 10 MG tablet Take 10 mg by mouth daily.    [name] escitalopram (LEXAPRO) 10 MG tablet Take 1 tablet (10 mg total) by mouth daily. 90 tablet 1  . famotidine (PEPCID) 20 MG tablet Take 1 tablet (20 mg total) by mouth 2 (two) times daily. 180 tablet 3  . furosemide (LASIX) 20 MG tablet Take 1 tablet (20 mg total) by mouth daily. 90 tablet 1  . hydrOXYzine (ATARAX/VISTARIL) 25 MG tablet Take 1 tablet (25 mg total) by mouth at bedtime. 90 tablet 1  . potassium chloride (K-DUR) 10 MEQ tablet Take 2 tablets (20 mEq total) by mouth daily. 30 tablet 0  . rosuvastatin (CRESTOR) 20 MG tablet Take 1 tablet (20 mg total) by mouth daily. 90 tablet 3  . ticagrelor (BRILINTA) 90 MG TABS tablet Take 1 tablet (90 mg total) by mouth 2 (two) times daily. 180 tablet 3  . traMADol (ULTRAM) 50 MG tablet Take 1 tablet (50 mg total) by mouth every 6 (six) hours as needed for moderate pain. 30 tablet 0   No current facility-administered medications for this visit.     Allergies:   Lopressor [metoprolol tartrate] and Lipitor [atorvastatin]   Social History:  The patient  reports that she quit smoking about 4 months ago. Her smoking use included cigarettes. She has a 4.44 pack-year smoking history. She has never used smokeless tobacco. She reports that she does not drink alcohol or use drugs.   Family History:  The patient's family history includes Diabetes type II in her mother; Heart attack in her father; Hyperlipidemia in her mother; Hypertension in her brother and mother.    ROS:  Please see the history of present illness.   Otherwise, review of systems is positive for pain, leg swelling, shortness of breath, balance problems.   All other systems are reviewed and negative.    PHYSICAL EXAM: VS:  BP 122/68   Pulse 88   Ht 5\' 1"  (1.549 m)   Wt 123 lb 6.4 oz (56 kg)   LMP  (LMP Unknown)   SpO2 96%    BMI 23.32 kg/m  , BMI Body mass index is 23.32 kg/m. GEN: Well nourished, well developed, in no acute distress  HEENT: normal  Neck: no JVD, carotid bruits, or masses Cardiac: RRR; no murmurs, rubs, or gallops,no edema  Respiratory:  clear to auscultation bilaterally, normal work of breathing GI: soft, nontender, nondistended, + BS MS: no deformity or atrophy  Skin: warm and dry Neuro:  Strength and sensation are intact Psych: euthymic mood, full affect  EKG:  EKG is not ordered today. Personal review of the ekg ordered 12/15/17 shows SR, rate 120  Recent Labs: 08/22/2017: TSH 1.086 11/18/2017: B Natriuretic Peptide 1,276.7 11/24/2017: Magnesium 2.3 11/30/2017: ALT 10 12/15/2017: BUN 19; Creatinine, Ser 0.68; Hemoglobin 10.0; Platelets 606; Potassium 3.7; Sodium 141    Lipid Panel  Component Value Date/Time   CHOL 237 (H) 08/22/2017 0811   TRIG 295 (H) 08/22/2017 0811   TRIG 301 (H) 08/22/2017 0811   HDL 35 (L) 08/22/2017 0811   CHOLHDL 6.8 08/22/2017 0811   VLDL 59 (H) 08/22/2017 0811   LDLCALC 143 (H) 08/22/2017 0811     Wt Readings from Last 3 Encounters:  12/22/17 123 lb 6.4 oz (56 kg)  12/18/17 122 lb (55.3 kg)  12/15/17 122 lb 6.4 oz (55.5 kg)      Other studies Reviewed: Additional studies/ records that were reviewed today include: TTE 11/16/17  Review of the above records today demonstrates:  - Left ventricle: The cavity size was mildly dilated. Wall   thickness was increased in a pattern of mild LVH. Systolic   function was moderately reduced. The estimated ejection fraction   was in the range of 35% to 40%. Diffuse hypokinesis. There is   akinesis of the basal-midinferolateral, inferior, and   inferoseptal myocardium. Features are consistent with a   pseudonormal left ventricular filling pattern, with concomitant   abnormal relaxation and increased filling pressure (grade 2   diastolic dysfunction). - Mitral valve: Mildly thickened leaflets . There was  mild to   moderate regurgitation. - Left atrium: The atrium was mildly dilated. - Right atrium: Central venous pressure (est): 8 mm Hg. - Atrial septum: No defect or patent foramen ovale was identified. - Tricuspid valve: There was trivial regurgitation. - Pulmonary arteries: Systolic pressure could not be accurately   estimated. - Pericardium, extracardiac: There was no pericardial effusion.   ASSESSMENT AND PLAN:  1.  Coronary artery disease status post two-vessel bypass: No current chest pain.  She is not had any discomfort since her operation.  2.  Chronic systolic and diastolic heart failure due to ischemic cardiomyopathy: Likely the cause of her shortness of breath.  I have encouraged her to continue with cardiac rehab.  She is on optimal medical therapy for heart failure.  3.  Paroxysmal atrial fibrillation: Likely the cause of her CVA.  She does require anticoagulation.  We Madalin Hughart start Xarelto today.  This patients CHA2DS2-VASc Score and unadjusted Ischemic Stroke Rate (% per year) is equal to 7.2 % stroke rate/year from a score of 5  Above score calculated as 1 point each if present [CHF, HTN, DM, Vascular=MI/PAD/Aortic Plaque, Age if 65-74, or Female] Above score calculated as 2 points each if present [Age > 75, or Stroke/TIA/TE]      Current medicines are reviewed at length with the patient today.   The patient does not have concerns regarding her medicines.  The following changes were made today:  Xarelto  Labs/ tests ordered today include:  No orders of the defined types were placed in this encounter.    Disposition:   FU with Tassie Pollett 6 months  Signed, Zonnique Norkus Jorja Loa, MD  12/22/2017 12:23 PM     [facility] [address] [place] [address] [phone] (office) [phone] (fax)

## 2017-12-22 NOTE — Progress Notes (Signed)
Subjective:    Patient ID: [name], female    DOB: 1960/10/18, 57 y.o.   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    DATE OF ADMISSION:  11/29/2017 DATE OF DISCHARGE:  12/08/2017  Husband present, provides majority of history. At discharge, she was instructed to follow up with CTS, which is next week. She saw Cards and was started on Xarelto. She saw PCP. She is maintaining sternal precautions.  She continues to take Baclofen.  BP is controlled.  She has only been on lasix for 3 days because she did not receive a prescription at discharge.  Denies falls.  Therapies: Outpt 1-2/week DME: Previously possessed Mobility: Quad cane  Pain Inventory Average Pain 5 Pain Right Now 3 My pain is aching  In the last 24 hours, has pain interfered with the following? General activity 0 Relation with others 0 Enjoyment of life 0 What TIME of day is your pain at its worst? varies Sleep (in general) Poor  Pain is worse with: unsure Pain improves with: medication Relief from Meds: 4  Mobility walk with assistance use a cane ability to climb steps?  no do you drive?  no Do you have any goals in this area?  yes  Function retired  Neuro/Psych trouble walking  Prior Studies Any changes since last visit?  no  Physicians involved in your care Any changes since last visit?  no   Family History  Problem Relation Age of Onset  . Heart attack Father   . Hypertension Mother   . Hyperlipidemia Mother   . Diabetes type II Mother   . Hypertension Brother    Social History   Socioeconomic History  . Marital status: Married    Spouse name: Not on file  . Number of children: 2  . Years of education: Not on file  . Highest education level: Not on file   Occupational History  . Not on file  Social Needs  . Financial resource strain: Not on file  . Food insecurity:    Worry: Not on file    Inability: Not on file  . Transportation needs:    Medical: Not on file    Non-medical: Not on file  Tobacco Use  . Smoking status: Former Smoker    Packs/day: 0.12    Years: 37.00    Pack years: 4.44    Types: Cigarettes    Last attempt to quit: 08/21/2017    Years since quitting: 0.3  . Smokeless tobacco: Never Used  Substance and Sexual Activity  . Alcohol use: No    Frequency: Never  . Drug use: No  . Sexual activity: Not on file  Lifestyle  . Physical activity:    Days per week: Not on file    Minutes per session: Not on file  . Stress: Not on file  Relationships  . Social connections:    Talks on phone: Patient refused    Gets together: Patient refused    Attends religious service: Patient refused    Active member of club or organization: Patient refused    Attends meetings of clubs or organizations: Patient refused    Relationship status: Patient refused  Other Topics Concern  . Not on file  Social History Narrative   Married. 2 children.  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    Walks with cane or wheelchair.    Former smoker.    Drinks caffeine.    Smoke alarm in the home.   Firearms in the home.    Wears her seatbelt.    Feels safe In her relationships.    Past Surgical History:  Procedure Laterality Date  . CORONARY ARTERY BYPASS GRAFT N/A 11/23/2017   Procedure: CORONARY ARTERY BYPASS GRAFTING (CABG) x 2, ON PUMP, USING LEFT INTERNAL MAMMARY ARTERY AND RIGHT GREATER SAPHENOUS VEIN HARVESTED ENDOSCOPICALLY;  Surgeon: Gaye Pollack, MD;  Location: [place];  Service: Open Heart Surgery;  Laterality: N/A;  . CRYOABLATION     cervical  . IR ANGIO INTRA EXTRACRAN SEL COM CAROTID INNOMINATE UNI R MOD SED  08/21/2017  . IR ANGIO VERTEBRAL SEL SUBCLAVIAN INNOMINATE UNI R MOD SED  08/21/2017  . IR PERCUTANEOUS ART  THROMBECTOMY/INFUSION INTRACRANIAL INC DIAG ANGIO  08/21/2017  . IR RADIOLOGIST EVAL & MGMT  10/30/2017  . LOOP RECORDER INSERTION N/A 08/28/2017   Procedure: LOOP RECORDER INSERTION;  Surgeon: Constance Haw, MD;  Location: [facility];  Service: Cardiovascular;  Laterality: N/A;  . RADIOLOGY WITH ANESTHESIA N/A 08/21/2017   Procedure: RADIOLOGY WITH ANESTHESIA;  Surgeon: Luanne Bras, MD;  Location: [place];  Service: Radiology;  Laterality: N/A;  . RIGHT/LEFT HEART CATH AND CORONARY ANGIOGRAPHY N/A 11/20/2017   Procedure: RIGHT/LEFT HEART CATH AND CORONARY ANGIOGRAPHY;  Surgeon: Jettie Booze, MD;  Location: [facility];  Service: Cardiovascular;  Laterality: N/A;  . TEE WITHOUT CARDIOVERSION N/A 08/28/2017   Procedure: TRANSESOPHAGEAL ECHOCARDIOGRAM (TEE);  Surgeon: Fay Records, MD;  Location: [place];  Service: Cardiovascular;  Laterality: N/A;  . TEE WITHOUT CARDIOVERSION N/A 11/23/2017   Procedure: TRANSESOPHAGEAL ECHOCARDIOGRAM (TEE);  Surgeon: Gaye Pollack, MD;  Location: [place];  Service: Open Heart Surgery;  Laterality: N/A;  . TUBAL LIGATION     Past Medical History:  Diagnosis Date  . Arthritis    "knees" (11/16/2017)  . CAD (coronary artery disease)    a. s/p NSTEMI in 10/2017 with cath showing LM disease --> s/p CABG on 11/23/2017 with LIMA-LAD and SVG-OM.   [name] Hay fever   . Pneumonia 11/16/2017  . Stroke ([place]) 08/23/2017   Left MCA infarct status post TPA and thrombectomy   BP 124/73 (BP Location: Left Arm, Patient Position: Sitting, Cuff Size: Normal)   Pulse 72   Resp 14   LMP  (LMP Unknown)   SpO2 95%   Opioid Risk Score:   Fall Risk Score:  `1  Depression screen PHQ 2/9  Depression screen [facility] 2/9 11/10/2017 11/10/2017 10/13/2017 10/05/2017 10/05/2017  Decreased Interest 0 0 0 1 0  Down, Depressed, Hopeless 0 0 0 0 0  PHQ - 2 Score 0 0 0 1 0  Altered sleeping 0 - - 0 -  Tired, decreased energy 0 - - 2 -  Change in appetite 0 - - 0  -  Feeling bad or failure about yourself  0 - - 0 -  Trouble concentrating 0 - - 1 -  Moving slowly or fidgety/restless 0 - - 1 -  Suicidal thoughts 0 - - 0 -  PHQ-9 Score 0 - - 5 -  Difficult doing work/chores Not difficult at all - - Not difficult at all -    Review of Systems  HENT: Negative.   Eyes: Negative.   Respiratory: Negative.   Cardiovascular: Positive for leg swelling.  Gastrointestinal: Negative.   Endocrine: Negative.   Genitourinary: Negative.   Musculoskeletal: Positive for gait problem.  Skin: Negative.   Allergic/Immunologic: Negative.   Neurological: Positive for speech difficulty, weakness and numbness.  Hematological: Negative.   Psychiatric/Behavioral: Negative.   All other systems reviewed and are negative.     Objective:   Physical Exam Constitutional: She appears well-developed and well-nourished.  HENT: Atraumatic. Normocephalic. Eyes: EOMI. No discharge.  Cardiovascular: RRR. No JVD   Respiratory: Normal effort. Clear. GI: Bowel sounds are normal. Non-distended Musculoskeletal: Left foot edema. Neurological: She is alert.  Right facial droop  A&Ox3 Word finding difficulty, improved Motor: Right upper extremity: 2/5 shoulder abduction, 1/5 elbow flex/ext, 1/5 hand grip  Right lower extremity: Hip flexion 4-/5, knee extension 4-/5, ADF/PF 3/5  MAS: elbow flexors 1/4, wrist ext 1/4, finger flexors 1/4, ankle plantor flexors 1+/4 Skin: Warm and dry.  Intact.  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  1. 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 Status post loop recorder  Cont therapies  Encouraged compliance with PRAFO/WHO   Cont Fluoxetine   Pt to  follow up with Vascular in a few months for arterial study that was not performed  2. Spastic hemiplegia  Cont Baclofen 5 TID  3. CAD S/P CABG x2 11/24/2017.  Follow up with Cards  Resume lasix  Cont meds  Started on Xarelto by Cards  Encouraged leg elevation  4. Neurologic gait abnormality  Cont therapies  5. Pain   From Sternal incision  Wean Tramadol  Encouraged Tylenol  Encouraged OTC Lidoderm patch  Meds reviewed Referrals reviewed All questions answered

## 2017-12-22 NOTE — Patient Instructions (Addendum)
Medication Instructions:  Your physician has recommended you make the following change in your medication:  1. STOP Aspirin 2. START Xarelto 20 mg once daily at supper  Labwork: None ordered  Testing/Procedures: None ordered  Follow-Up: Your physician wants you to follow-up in: 6 months with Dr. Curt Bears.  You will receive a reminder letter in the mail two months in advance. If you don't receive a letter, please call our office to schedule the follow-up appointment.  * If you need a refill on your cardiac medications before your next appointment, please call your pharmacy.   *Please note that any paperwork needing to be filled out by the provider will need to be addressed at the front desk prior to seeing the provider. Please note that any FMLA, disability or other documents regarding health condition is subject to a $25.00 charge that must be received prior to completion of paperwork in the form of a money order or check.  Thank you for choosing [facility]!!   Trinidad Curet, RN [phone]  Any Other Special Instructions Will Be Listed Below (If Applicable).  Rivaroxaban oral tablets What is this medicine? RIVAROXABAN (ri va ROX a ban) is an anticoagulant (blood thinner). It is used to treat blood clots in the lungs or in the veins. It is also used after knee or hip surgeries to prevent blood clots. It is also used to lower the chance of stroke in people with a medical condition called atrial fibrillation. This medicine may be used for other purposes; ask your health care provider or pharmacist if you have questions. COMMON BRAND NAME(S): Xarelto, Xarelto Starter Pack What should I tell my health care provider before I take this medicine? They need to know if you have any of these conditions: -bleeding disorders -bleeding in the brain -blood in your stools (black or tarry stools) or if you have blood in your vomit -history of stomach bleeding -kidney disease -liver  disease -low blood counts, like low white cell, platelet, or red cell counts -recent or planned spinal or epidural procedure -take medicines that treat or prevent blood clots -an unusual or allergic reaction to rivaroxaban, other medicines, foods, dyes, or preservatives -pregnant or trying to get pregnant -breast-feeding How should I use this medicine? Take this medicine by mouth with a glass of water. Follow the directions on the prescription label. Take your medicine at regular intervals. Do not take it more often than directed. Do not stop taking except on your doctor's advice. Stopping this medicine may increase your risk of a blood clot. Be sure to refill your prescription before you run out of medicine. If you are taking this medicine after hip or knee replacement surgery, take it with or without food. If you are taking this medicine for atrial fibrillation, take it with your evening meal. If you are taking this medicine to treat blood clots, take it with food at the same time each day. If you are unable to swallow your tablet, you may crush the tablet and mix it in applesauce. Then, immediately eat the applesauce. You should eat more food right after you eat the applesauce containing the crushed tablet. Talk to your pediatrician regarding the use of this medicine in children. Special care may be needed. Overdosage: If you think you have taken too much of this medicine contact a poison control center or emergency room at once. NOTE: This medicine is only for you. Do not share this medicine with others. What if I miss a dose?  If you take your medicine once a day and miss a dose, take the missed dose as soon as you remember. If you take your medicine twice a day and miss a dose, take the missed dose immediately. In this instance, 2 tablets may be taken at the same time. The next day you should take 1 tablet twice a day as directed. What may interact with this medicine? Do not take this medicine  with any of the following medications: -defibrotide This medicine may also interact with the following medications: -aspirin and aspirin-like medicines -certain antibiotics like erythromycin, azithromycin, and clarithromycin -certain medicines for fungal infections like ketoconazole and itraconazole -certain medicines for irregular heart beat like amiodarone, quinidine, dronedarone -certain medicines for seizures like carbamazepine, phenytoin -certain medicines that treat or prevent blood clots like warfarin, enoxaparin, and dalteparin -conivaptan -diltiazem -felodipine -indinavir -lopinavir; ritonavir -NSAIDS, medicines for pain and inflammation, like ibuprofen or naproxen -ranolazine -rifampin -ritonavir -SNRIs, medicines for depression, like desvenlafaxine, duloxetine, levomilnacipran, venlafaxine -SSRIs, medicines for depression, like citalopram, escitalopram, fluoxetine, fluvoxamine, paroxetine, sertraline -St. John's wort -verapamil This list may not describe all possible interactions. Give your health care provider a list of all the medicines, herbs, non-prescription drugs, or dietary supplements you use. Also tell them if you smoke, drink alcohol, or use illegal drugs. Some items may interact with your medicine. What should I watch for while using this medicine? Visit your doctor or health care professional for regular checks on your progress. Notify your doctor or health care professional and seek emergency treatment if you develop breathing problems; changes in vision; chest pain; severe, sudden headache; pain, swelling, warmth in the leg; trouble speaking; sudden numbness or weakness of the face, arm or leg. These can be signs that your condition has gotten worse. If you are going to have surgery or other procedure, tell your doctor that you are taking this medicine. What side effects may I notice from receiving this medicine? Side effects that you should report to your doctor  or health care professional as soon as possible: -allergic reactions like skin rash, itching or hives, swelling of the face, lips, or tongue -back pain -redness, blistering, peeling or loosening of the skin, including inside the mouth -signs and symptoms of bleeding such as bloody or black, tarry stools; red or dark-brown urine; spitting up blood or brown material that looks like coffee grounds; red spots on the skin; unusual bruising or bleeding from the eye, gums, or nose Side effects that usually do not require medical attention (report to your doctor or health care professional if they continue or are bothersome): -dizziness -muscle pain This list may not describe all possible side effects. Call your doctor for medical advice about side effects. You may report side effects to FDA at 1-800-FDA-1088. Where should I keep my medicine? Keep out of the reach of children. Store at room temperature between 15 and 30 degrees C (59 and 86 degrees F). Throw away any unused medicine after the expiration date. NOTE: This sheet is a summary. It may not cover all possible information. If you have questions about this medicine, talk to your doctor, pharmacist, or health care provider.  2018 Elsevier/Gold Standard (2016-06-01 16:29:33)

## 2017-12-25 ENCOUNTER — Encounter: Payer: Self-pay | Admitting: Physical Therapy

## 2017-12-25 ENCOUNTER — Ambulatory Visit: Payer: BLUE CROSS/BLUE SHIELD | Admitting: Occupational Therapy

## 2017-12-25 ENCOUNTER — Ambulatory Visit: Payer: BLUE CROSS/BLUE SHIELD | Attending: Family Medicine | Admitting: Physical Therapy

## 2017-12-25 DIAGNOSIS — R2681 Unsteadiness on feet: Secondary | ICD-10-CM | POA: Diagnosis not present

## 2017-12-25 DIAGNOSIS — I69351 Hemiplegia and hemiparesis following cerebral infarction affecting right dominant side: Secondary | ICD-10-CM

## 2017-12-25 DIAGNOSIS — R278 Other lack of coordination: Secondary | ICD-10-CM | POA: Insufficient documentation

## 2017-12-25 DIAGNOSIS — R600 Localized edema: Secondary | ICD-10-CM | POA: Insufficient documentation

## 2017-12-25 DIAGNOSIS — R293 Abnormal posture: Secondary | ICD-10-CM | POA: Diagnosis not present

## 2017-12-25 DIAGNOSIS — M6281 Muscle weakness (generalized): Secondary | ICD-10-CM | POA: Diagnosis not present

## 2017-12-25 DIAGNOSIS — R2689 Other abnormalities of gait and mobility: Secondary | ICD-10-CM | POA: Diagnosis not present

## 2017-12-25 DIAGNOSIS — I639 Cerebral infarction, unspecified: Secondary | ICD-10-CM | POA: Diagnosis not present

## 2017-12-25 NOTE — Therapy (Signed)
[facility] [address] [place], [place], [address] Phone: [phone]   Fax:  [phone]  Physical Therapy Treatment  Patient Details  Name: [name] MRN: [id number] Date of Birth: 03-Jul-1961 Referring Provider: Delice Lesch MD   Encounter Date: 12/25/2017  PT End of Session - 12/25/17 1948    Visit Number  2    Number of Visits  8    Authorization Type  BCBS    Authorization Time Period  Unclear pt's current visit count for 2019 (or visit limit--?24 or 30). 3/27 left message for insurance specialist to assist with clarification; VL =30, used 14 this year with 16 remaining (to share between PT and OT)    Authorization - Visit Number  4 2 PT, 2 OT    Authorization - Number of Visits  16 total for PT and OT combined    PT Start Time  1020    PT Stop Time  1100    PT Time Calculation (min)  40 min    Activity Tolerance  Patient tolerated treatment well    Behavior During Therapy  WFL for tasks assessed/performed       Past Medical History:  Diagnosis Date  . Arthritis    "knees" (11/16/2017)  . CAD (coronary artery disease)    a. s/p NSTEMI in 10/2017 with cath showing LM disease --> s/p CABG on 11/23/2017 with LIMA-LAD and SVG-OM.   [name] Hay fever   . Pneumonia 11/16/2017  . Stroke [facility]) 08/23/2017   Left MCA infarct status post TPA and thrombectomy    Past Surgical History:  Procedure Laterality Date  . CORONARY ARTERY BYPASS GRAFT N/A 11/23/2017   Procedure: CORONARY ARTERY BYPASS GRAFTING (CABG) x 2, ON PUMP, USING LEFT INTERNAL MAMMARY ARTERY AND RIGHT GREATER SAPHENOUS VEIN HARVESTED ENDOSCOPICALLY;  Surgeon: Gaye Pollack, MD;  Location: [facility];  Service: Open Heart Surgery;  Laterality: N/A;  . CRYOABLATION     cervical  . IR ANGIO INTRA EXTRACRAN SEL COM CAROTID INNOMINATE UNI R MOD SED  08/21/2017  . IR ANGIO VERTEBRAL SEL SUBCLAVIAN INNOMINATE UNI R MOD SED  08/21/2017  . IR PERCUTANEOUS ART  THROMBECTOMY/INFUSION INTRACRANIAL INC DIAG ANGIO  08/21/2017  . IR RADIOLOGIST EVAL & MGMT  10/30/2017  . LOOP RECORDER INSERTION N/A 08/28/2017   Procedure: LOOP RECORDER INSERTION;  Surgeon: Constance Haw, MD;  Location: [facility];  Service: Cardiovascular;  Laterality: N/A;  . RADIOLOGY WITH ANESTHESIA N/A 08/21/2017   Procedure: RADIOLOGY WITH ANESTHESIA;  Surgeon: Luanne Bras, MD;  Location: [facility];  Service: Radiology;  Laterality: N/A;  . RIGHT/LEFT HEART CATH AND CORONARY ANGIOGRAPHY N/A 11/20/2017   Procedure: RIGHT/LEFT HEART CATH AND CORONARY ANGIOGRAPHY;  Surgeon: Jettie Booze, MD;  Location: [facility] CV [facility];  Service: Cardiovascular;  Laterality: N/A;  . TEE WITHOUT CARDIOVERSION N/A 08/28/2017   Procedure: TRANSESOPHAGEAL ECHOCARDIOGRAM (TEE);  Surgeon: Fay Records, MD;  Location: [facility];  Service: Cardiovascular;  Laterality: N/A;  . TEE WITHOUT CARDIOVERSION N/A 11/23/2017   Procedure: TRANSESOPHAGEAL ECHOCARDIOGRAM (TEE);  Surgeon: Gaye Pollack, MD;  Location: [facility];  Service: Open Heart Surgery;  Laterality: N/A;  . TUBAL LIGATION      There were no vitals filed for this visit.  Subjective Assessment - 12/25/17 1028    Subjective  Together with her husband's help she has decided to continue with both PT and OT with plan to use all 16 remaining visits.  Patient is accompained by:  Family member husband, [name]    Pertinent History  ischemic cardiomyopathy with CHF and EF 35-40%;  lt CVA 11/18; aspiration pneumonia with hospitalization 10/2017; CABG 11/23/17; arthritis (esp knees)    Limitations  Lifting;Walking    Patient Stated Goals  walk with more coordination and without an assistive device    Currently in Pain?  Yes    Pain Score  3     Pain Location  Chest    Pain Descriptors / Indicators  Sore    Pain Type  Surgical pain    Pain Onset  1 to 4 weeks ago    Pain Frequency  Intermittent         Treatment- ADL/Self  care-education on limited insurance covered visits and how she wants to divide/use these visitis between PT and OT.   There-ex-See pt instructions with 5-10 reps completed of each exercise.                       PT Education - 12/25/17 1948    Education Details  HEP    Person(s) Educated  Patient;Spouse    Methods  Explanation;Demonstration;Verbal cues;Handout    Comprehension  Verbalized understanding;Returned demonstration;Need further instruction        PT Short Term Goals - 12/20/17 2118      PT SHORT TERM GOAL #1   Title  verbalize understanding of sternal precautions and CVA risk factors/warning signs (Target all STGs 01/19/18)    Time  4    Period  Weeks    Status  New    Target Date  01/19/18      PT SHORT TERM GOAL #2   Title  amb >= 350' with LRAD on indoor/paved outdoor surfaces modified independent for improved function    Time  4    Period  Weeks    Status  New      PT SHORT TERM GOAL #3   Title  improve timed up and go to < 25 sec with LRAD for improved mobility    Time  4    Period  Weeks    Status  New      PT SHORT TERM GOAL #4   Title  improve gait velocity to at least 1.0 ft/sec for improved mobility    Time  4    Period  Weeks    Status  New        PT Long Term Goals - 12/20/17 2122      PT LONG TERM GOAL #1   Title  independent with HEP (Target all LTGs 02/18/2018)    Time  8    Period  Weeks    Status  New    Target Date  02/18/18      PT LONG TERM GOAL #2   Title  improve BERG balance score to >/= 49/56 for improved balance and decreased fall risk    Time  8    Period  Weeks    Status  New      PT LONG TERM GOAL #3   Title  improve timed up and go to < 20 sec for improved mobility    Time  8    Period  Weeks    Status  New      PT LONG TERM GOAL #4   Title  improve gait velocity to > 1.8 ft/sec for decreased fall risk    Time  8  Period  Weeks    Status  New      PT LONG TERM GOAL #5   Title  amb > 500'  on various indoor/outdoor surfaces modified independent with LRAD for 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 Pt/husband agreed to drop to 1x/week for each discipline to "spread out" her visits. Pt reported she had stopped doing her previous HEP because she was not sure it was OK for her to do after CABG. HEP updated with sternal precautions in mind. Patient can continue to benefit from PT to improve her safety and efficiency/independence.     Rehab Potential  Good    PT Frequency  1x / week decrease from 2x/wk to 1x/wk    PT Duration  8 weeks may need to adjust based on available remaining visits    PT Treatment/Interventions  ADLs/Self Care Home Management;Electrical Stimulation;Therapeutic exercise;Therapeutic activities;Functional mobility training;Stair training;Gait training;DME Instruction;Neuromuscular re-education;Balance training;Patient/family education;Orthotic Fit/Training;Manual techniques;Vestibular;Taping;Aquatic Therapy;Passive range of motion    PT Next Visit Plan  continue sternal precautions; review s/s of CVA and actions to take; add balance exercises to HEP; gait training without quad cane (? with RW with walker splint for rt hand to work on continuity of gait.     Consulted and Agree with Plan of Care  Patient;Family member/caregiver    Family Member Consulted  husband       Patient will benefit from skilled therapeutic intervention in order to improve the following deficits and impairments:  Abnormal gait, Decreased strength, Decreased balance, Difficulty walking, Decreased mobility, Impaired perceived functional ability, Decreased activity tolerance, Decreased endurance, Decreased knowledge of use of DME, Increased edema, Impaired UE functional use  Visit  Diagnosis: Muscle weakness (generalized)  Other abnormalities of gait and mobility     Problem List Patient Active Problem List   Diagnosis Date Noted  . Paroxysmal atrial fibrillation ([place]) 12/19/2017  . Chronic combined systolic and diastolic CHF (congestive heart failure) ([facility]) 12/16/2017  . Debility 11/29/2017  . Benign essential HTN   . Acute blood loss anemia   . Tachycardia   . Hyperglycemia   . Supplemental oxygen dependent   . 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 Coronary artery disease 11/23/2017  . Acute on chronic systolic heart failure ([place]) 11/21/2017  . Acute respiratory failure with hypoxia ([facility]) 11/16/2017  . Aspiration pneumonia ([facility]) 11/16/2017  . NSTEMI (non-ST elevated myocardial infarction) ([place] [facility]) 11/16/2017  . Stress and adjustment reaction 10/13/2017  . Urticaria 10/13/2017  . Former smoker 10/13/2017  . Cerebrovascular accident (CVA) due to occlusion of left middle cerebral artery ([facility])   . Right hemiplegia ([facility])   . Dysphagia, post-stroke   . Hyperlipidemia     Rexanne Mano, PT 12/25/2017, 8:23 PM  [facility] [address] [place], [place], [address] Phone: [phone]   Fax:  [phone]  Name: [name] MRN: [id number] Date of Birth: 01/17/1961

## 2017-12-25 NOTE — Therapy (Addendum)
[facility] [address] [place], [place], [address] Phone: [phone]   Fax:  [phone]  Occupational Therapy Treatment  Patient Details  Name: [name] MRN: [id number] Date of Birth: Jan 08, 1961 Referring Provider: Delice Lesch MD   Encounter Date: 12/25/2017  OT End of Session - 12/25/17 1216    Visit Number  1   Number of Visits  8    Date for OT Re-Evaluation  01/20/18    Authorization Type  BC/BS    Authorization - Visit Number  9    Authorization - Number of Visits  16    OT Start Time  1100    OT Stop Time  1145    OT Time Calculation (min)  45 min    Activity Tolerance  Patient tolerated treatment well       Past Medical History:  Diagnosis Date  . Arthritis    "knees" (11/16/2017)  . CAD (coronary artery disease)    a. s/p NSTEMI in 10/2017 with cath showing LM disease --> s/p CABG on 11/23/2017 with LIMA-LAD and SVG-OM.   [name] Hay fever   . Pneumonia 11/16/2017  . Stroke [facility]) 08/23/2017   Left MCA infarct status post TPA and thrombectomy    Past Surgical History:  Procedure Laterality Date  . CORONARY ARTERY BYPASS GRAFT N/A 11/23/2017   Procedure: CORONARY ARTERY BYPASS GRAFTING (CABG) x 2, ON PUMP, USING LEFT INTERNAL MAMMARY ARTERY AND RIGHT GREATER SAPHENOUS VEIN HARVESTED ENDOSCOPICALLY;  Surgeon: Gaye Pollack, MD;  Location: [facility];  Service: Open Heart Surgery;  Laterality: N/A;  . CRYOABLATION     cervical  . IR ANGIO INTRA EXTRACRAN SEL COM CAROTID INNOMINATE UNI R MOD SED  08/21/2017  . IR ANGIO VERTEBRAL SEL SUBCLAVIAN INNOMINATE UNI R MOD SED  08/21/2017  . IR PERCUTANEOUS ART THROMBECTOMY/INFUSION INTRACRANIAL INC DIAG ANGIO  08/21/2017  . IR RADIOLOGIST EVAL & MGMT  10/30/2017  . LOOP RECORDER INSERTION N/A 08/28/2017   Procedure: LOOP RECORDER INSERTION;  Surgeon: Constance Haw, MD;  Location: [facility] CV [facility];  Service: Cardiovascular;  Laterality: N/A;  . RADIOLOGY  WITH ANESTHESIA N/A 08/21/2017   Procedure: RADIOLOGY WITH ANESTHESIA;  Surgeon: Luanne Bras, MD;  Location: [facility];  Service: Radiology;  Laterality: N/A;  . RIGHT/LEFT HEART CATH AND CORONARY ANGIOGRAPHY N/A 11/20/2017   Procedure: RIGHT/LEFT HEART CATH AND CORONARY ANGIOGRAPHY;  Surgeon: Jettie Booze, MD;  Location: [facility];  Service: Cardiovascular;  Laterality: N/A;  . TEE WITHOUT CARDIOVERSION N/A 08/28/2017   Procedure: TRANSESOPHAGEAL ECHOCARDIOGRAM (TEE);  Surgeon: Fay Records, MD;  Location: [facility];  Service: Cardiovascular;  Laterality: N/A;  . TEE WITHOUT CARDIOVERSION N/A 11/23/2017   Procedure: TRANSESOPHAGEAL ECHOCARDIOGRAM (TEE);  Surgeon: Gaye Pollack, MD;  Location: [facility];  Service: Open Heart Surgery;  Laterality: N/A;  . TUBAL LIGATION      There were no vitals filed for this visit.  Subjective Assessment - 12/25/17 1105    Pertinent History  recent CABG x 2 11/23/17, Lt CVA 07/2017. PMH: CAD, HTN, MI    Limitations  sternal precautions including no lifting > 5 lbs, *loop recorder, fall risk, no driving    Patient Stated Goals  Get my arm working    Currently in Pain?  Yes    Pain Score  3     Pain Location  Arm    Pain Orientation  Right    Pain Descriptors / Indicators  Sore    Pain Frequency  Intermittent    Aggravating Factors   when shoulder is passively ranged in abduction (other arm placed across chest to adhere to sternal precautions)     Pain Relieving Factors  after gentle stretching/repositioning                   OT Treatments/Exercises (OP) - 12/25/17 0001      ADLs   ADL Comments  Revised LTG's and POC due to limited visits/insurance limitations. Reviewed sternal precautions with pt/family      Neurological Re-education Exercises   Other Exercises 1  Refer to HEP - see pt instructions for details             OT Education - 12/25/17 1148    Education provided  Yes    Education Details  HEP      Person(s) Educated  Patient;Spouse    Methods  Explanation;Demonstration;Handout    Comprehension  Verbalized understanding;Returned demonstration          OT Long Term Goals - 12/25/17 1223      OT LONG TERM GOAL #1   Title  Independent with updated HEP    Time  4    Period  Weeks    Status  New    Target Date  01/20/18      OT LONG TERM GOAL #2   Title  Pt to verbalize understanding with edema management strategies Rt hand and positioning for Rt shoulder to minimize pain    Time  4    Period  Weeks    Status  New      OT LONG TERM GOAL #3   Title  Pt to verbalize understanding with A/E needs and task modifications to increase independence with ADLS/IADLS    Time  4    Period  Weeks    Status  New      OT LONG TERM GOAL #4   Title  Pt to make sandwich/snack/microwaveable meals consistently at mod I level using A/E prn    Time  4    Period  Weeks    Status  New      OT LONG TERM GOAL #5   Title  Pt to perform light housework (laundry, dishes) at modified independence level    Time  4    Period  Weeks    Status  New            Plan - 12/25/17 1220    Clinical Impression Statement  Modified plan of care and frequency and duration due to clarification of insurance/visit limitation.     Occupational Profile and client history currently impacting functional performance  Recent CABG x 2 11/23/17, CVA 07/2017, MI, CAD    Occupational performance deficits (Please refer to evaluation for details):  ADL's;IADL's;Social Participation;Other    Rehab Potential  Good    OT Frequency  2x / week    OT Duration  4 weeks OR 1x/wk for 8 weeks   OT Treatment/Interventions  Self-care/ADL training;Moist Heat;Fluidtherapy;DME and/or AE instruction;Splinting;Aquatic Therapy;Therapeutic activities;Compression bandaging;Other (comment);Therapeutic exercise;Neuromuscular education;Functional Mobility Training;Passive range of motion;Visual/perceptual remediation/compensation;Manual  Therapy;Electrical Stimulation;Energy conservation;Patient/family education    Plan  continue NMR while adhering to sternal precautions, show one handed cutting board and pot stabalizer, begin to address LTG #2    Consulted and Agree with Plan of Care  Patient;Family member/caregiver    Family Member Consulted  husband       Patient will benefit from  skilled therapeutic intervention in order to improve the following deficits and impairments:  Decreased coordination, Decreased range of motion, Difficulty walking, Decreased endurance, Impaired tone, Decreased activity tolerance, Decreased knowledge of precautions, Decreased balance, Decreased knowledge of use of DME, Impaired UE functional use, Pain, Decreased mobility, Decreased strength  Visit Diagnosis: Hemiplegia and hemiparesis following cerebral infarction affecting right dominant side (HCC)  Localized edema  Abnormal posture  Muscle weakness (generalized)  Other lack of coordination  Unsteadiness on feet    Problem List Patient Active Problem List   Diagnosis Date Noted  . Paroxysmal atrial fibrillation ([place]) 12/19/2017  . Chronic combined systolic and diastolic CHF (congestive heart failure) ([place]) 12/16/2017  . Debility 11/29/2017  . Benign essential HTN   . Acute blood loss anemia   . Tachycardia   . Hyperglycemia   . Supplemental oxygen dependent   . 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 Coronary artery disease 11/23/2017  . Acute on chronic systolic heart failure ([facility]) 11/21/2017  . Acute respiratory failure with hypoxia ([facility]) 11/16/2017  . Aspiration pneumonia ([facility]) 11/16/2017  . NSTEMI (non-ST elevated myocardial infarction) ([place]) 11/16/2017  . Stress and adjustment reaction 10/13/2017  . Urticaria 10/13/2017  . Former smoker 10/13/2017  . Cerebrovascular accident (CVA) due to occlusion of left middle cerebral artery ([facility] [facility])   . Right hemiplegia ([place])   . Dysphagia, post-stroke   . Hyperlipidemia     Carey Bullocks, OTR/L 12/25/2017, 12:31 PM  [facility] [address] [place], [place], [address] Phone: [phone]   Fax:  [phone]  Name: [name] MRN: [id number] Date of Birth: [birth year]

## 2017-12-25 NOTE — Patient Instructions (Addendum)
               Bridging    Slowly raise buttocks from floor/bed step once with each foot. Then lower slowly.  Repeat _10___ times per set. Do _1___ sets per session.    Straight Leg Raise    Slowly raise locked right leg __6-8__ inches from floor. Repeat __15__ times per set. Do __1__ sets per session.    Abduction: Clam (Eccentric) - Side-Lying    Place red band around your legs above your knees. Lie on left side with knees bent. Lift top knee, keeping feet together. Keep trunk steady. Slowly lower. _10_ reps per set, _2__ sets per day, _7__ days per week. Turn on other side and do the other leg.    ANKLE: Dorsiflexion (Band)    Sit at edge of surface. Place band around top of foot. Keeping heel on floor, raise toes of banded foot. Hold _1-2__ seconds. Use ___red_____ band. _15__ reps per set, _2_ sets    ANKLE: Eversion, Unilateral (Band)    Place band around left foot. Keeping heel in place, raise toes of banded foot up and away from body. Do not move hip. Hold _1-2__ seconds. Use ___red_____ band. _15__ reps per set, _2-3__ sets per day, _7__ days per week   Functional Quadriceps: Sit to Stand    Sit on edge of chair, feet flat on floor. Stand upright, extending knees fully. Repeat __15__ times per set. Do __2__ sets per session.

## 2017-12-25 NOTE — Patient Instructions (Signed)
1. Flexion (Assistive)    Laying down in recliner. Hold Rt wrist with Lt hand, thumb side up and raise arms above head, keeping elbows as straight as possible. Repeat __10__ times. Do _2-3___ sessions per day.  2. SHOULDER: Flexion On Table    Place hands on table, Lt hand guiding RT arm, elbows straight.  Press hands down into table. Slide arms forward along table on towel, then back. _10__ reps per set, _2-3__ sets per day. Then do big circles clockwise 10 reps.   3. Sitting with pool noodle in lap, hold with both hands shoulder width apart, slide pool noodle away from you on lap, then raise off lap, then back onto lap, then slide close to you. REpeat 10 times, 2-3 times per day.   4. Supination (Passive)    Keep elbow bent at right angle and held firmly at side. Use other hand to turn forearm until palm faces upward. Hold __10__ seconds. Repeat __5_ times. Do _2-3___ sessions per day.  5. Palm down, gently stretch wrist and fingers back towards you. Keep wrist in alignment with forearm and do not hyperextend big knuckles (Keep a little bend in the big knuckles). Hold at least 10 seconds. Repeat 5 times. Do 2-3 sessions per day.

## 2017-12-26 ENCOUNTER — Other Ambulatory Visit: Payer: Self-pay | Admitting: Surgery

## 2017-12-26 DIAGNOSIS — Z951 Presence of aortocoronary bypass graft: Secondary | ICD-10-CM

## 2017-12-27 ENCOUNTER — Ambulatory Visit (INDEPENDENT_AMBULATORY_CARE_PROVIDER_SITE_OTHER): Payer: Self-pay | Admitting: Surgery

## 2017-12-27 ENCOUNTER — Encounter: Payer: Self-pay | Admitting: Surgery

## 2017-12-27 ENCOUNTER — Other Ambulatory Visit: Payer: Self-pay

## 2017-12-27 ENCOUNTER — Other Ambulatory Visit: Payer: Self-pay | Admitting: Cardiology

## 2017-12-27 ENCOUNTER — Ambulatory Visit
Admission: RE | Admit: 2017-12-27 | Discharge: 2017-12-27 | Disposition: A | Discharge status: Home / Self Care | Payer: BLUE CROSS/BLUE SHIELD | Source: Ambulatory Visit | Attending: Surgery | Admitting: Surgery

## 2017-12-27 VITALS — BP 116/74 | HR 81 | Resp 16 | Ht 61.0 in | Wt 116.6 lb

## 2017-12-27 DIAGNOSIS — Z951 Presence of aortocoronary bypass graft: Secondary | ICD-10-CM

## 2017-12-27 DIAGNOSIS — I25810 Atherosclerosis of coronary artery bypass graft(s) without angina pectoris: Secondary | ICD-10-CM | POA: Diagnosis not present

## 2017-12-27 DIAGNOSIS — I2510 Atherosclerotic heart disease of native coronary artery without angina pectoris: Secondary | ICD-10-CM

## 2017-12-27 NOTE — Progress Notes (Signed)
HPI: Patient returns for routine postoperative follow-up having undergone coronary bypass graft surgery x2 on 11/24/2017.  She had right axillary artery cannulation for cardiopulmonary bypass to 2 severe calcification of her ascending aorta. The patient's early postoperative recovery while in the hospital was notable for a slow but uncomplicated postoperative course due to a recent left MCA stroke in 07/2017 with right hemiparesis.  She was discharged to inpatient rehab. Since hospital discharge the patient reports that she has been feeling well overall.  She still has some shortness of breath with exertion but that is improving.  She had did have some swelling in her legs but that is improved after she got on Lasix.  She went back to see Dr. Curt Bears for follow-up of her atrial fibrillation and was started back on Xarelto.  Her appetite is still not back to normal but she is trying to make herself eat as much as possible.  She is still sleeping in a recliner due to chest wall discomfort.  He is here with her husband today who feels like she is making good recovery.     Current Outpatient Medications  Medication Sig Dispense Refill  . baclofen (LIORESAL) 10 MG tablet Take 0.5 tablets (5 mg total) by mouth 3 (three) times daily. 45 tablet 1  . bisacodyl (DULCOLAX) 5 MG EC tablet Take 2 tablets (10 mg total) by mouth daily as needed for moderate constipation (Give daily if no BM). 30 tablet 0  . carvedilol (COREG) 6.25 MG tablet Take 1 tablet (6.25 mg total) by mouth 2 (two) times daily with a meal. 60 tablet 0  . cetirizine (ZYRTEC) 10 MG tablet Take 10 mg by mouth daily.    [name] escitalopram (LEXAPRO) 10 MG tablet Take 1 tablet (10 mg total) by mouth daily. 90 tablet 1  . famotidine (PEPCID) 20 MG tablet Take 1 tablet (20 mg total) by mouth 2 (two) times daily. 180 tablet 3  . furosemide (LASIX) 20 MG tablet Take 1 tablet (20 mg total) by mouth daily. 90 tablet 1  . hydrOXYzine (ATARAX/VISTARIL)  25 MG tablet Take 1 tablet (25 mg total) by mouth at bedtime. 90 tablet 1  . rivaroxaban (XARELTO) 20 MG TABS tablet Take 1 tablet (20 mg total) by mouth daily with supper. 30 tablet 6  . rosuvastatin (CRESTOR) 20 MG tablet Take 1 tablet (20 mg total) by mouth daily. 90 tablet 3  . traMADol (ULTRAM) 50 MG tablet Take 1 tablet (50 mg total) by mouth every 6 (six) hours as needed for moderate pain. 30 tablet 0  . potassium chloride (K-DUR) 10 MEQ tablet Take 2 tablets (20 mEq total) by mouth daily. (Patient not taking: Reported on 12/27/2017) 30 tablet 0  . rivaroxaban (XARELTO) 20 MG TABS tablet Take 1 tablet (20 mg total) by mouth daily with supper. 30 tablet 0   No current facility-administered medications for this visit.     Physical Exam: BP 116/74 (BP Location: Left Arm, Patient Position: Sitting, Cuff Size: Normal)   Pulse 81   Resp 16   Ht 5\' 1"  (1.549 m)   Wt 116 lb 9.6 oz (52.9 kg)   LMP  (LMP Unknown)   SpO2 96% Comment: ON RA  BMI 22.03 kg/m  She looks well. Cardiac exam shows a regular rate and rhythm with normal heart sounds. Chest incision is healing well and the sternum is stable. Lung exam is clear. Her leg incision is healing well and there is no peripheral  edema.  Diagnostic Tests:  CLINICAL DATA:  Recent coronary artery bypass grafting  EXAM: CHEST - 2 VIEW  COMPARISON:  November 30, 2017  FINDINGS: There is no edema or consolidation. Heart is mildly enlarged with pulmonary vascularity within normal limits. There is a loop recorder on the left anteriorly. Patient is status post coronary artery bypass grafting. There are surgical clips in the right upper hemithorax region. No pneumothorax. No evident adenopathy. No bone lesions. There is aortic atherosclerosis.  IMPRESSION: No edema or consolidation. Stable cardiac prominence. Aortic atherosclerosis. Patient is status post coronary artery bypass grafting. Loop recorder on the left.  Aortic  Atherosclerosis (ICD10-I70.0).   Electronically Signed   By: Lowella Grip III M.D.   On: 12/27/2017 09:40  Impression:  Overall I think she is making good progress following her surgery especially considering her recent stroke.  She is going to continue outpatient physical therapy.  I would expect her appetite and sleeping patterns to gradually improved.  I asked her not to lift anything heavier than 10 pounds for 3 months postoperatively.  We discussed sternal precautions with activity.  Plan:  She is going to continue follow-up with cardiology and will return to see me if she has any problems with her incisions.   Gaye Pollack, MD [facility] [phone]

## 2017-12-29 ENCOUNTER — Ambulatory Visit: Payer: BLUE CROSS/BLUE SHIELD | Admitting: Physical Therapy

## 2018-01-01 ENCOUNTER — Ambulatory Visit (INDEPENDENT_AMBULATORY_CARE_PROVIDER_SITE_OTHER): Payer: BLUE CROSS/BLUE SHIELD | Admitting: *Deleted

## 2018-01-01 ENCOUNTER — Ambulatory Visit: Payer: BLUE CROSS/BLUE SHIELD | Admitting: Physical Therapy

## 2018-01-01 DIAGNOSIS — I63512 Cerebral infarction due to unspecified occlusion or stenosis of left middle cerebral artery: Secondary | ICD-10-CM | POA: Diagnosis not present

## 2018-01-02 ENCOUNTER — Telehealth: Payer: Self-pay | Admitting: Family Medicine

## 2018-01-02 MED ORDER — POTASSIUM CHLORIDE ER 20 MEQ PO TBCR: 20.0000 meq | EXTENDED_RELEASE_TABLET | Freq: Every day | ORAL | 1 refills | Status: DC

## 2018-01-02 NOTE — Telephone Encounter (Signed)
Copied from [place] [id number]. Topic: Quick Communication - Rx Refill/Question >> Jan 02, 2018  9:30 AM Margot Ables wrote: Medication: potassium chloride (K-DUR) 10 MEQ tablet  -  pt requesting refill - she called Dr. Curt Bears w/cardiology and was advised to call Dr. Raoul Pitch - it was prescribed when pt d/c from the hospital - she was taking 2 daily but has not taken in 2 weeks because no one would refill - please advise. Has the patient contacted their pharmacy? Yes - hospital doctor will not fill Preferred Pharmacy (with phone number or street name): [facility] [address], [place] - [place] [address] [phone] (Phone) [phone] (Fax)

## 2018-01-02 NOTE — Progress Notes (Signed)
[facility] Summary Report / Loop Recorder 

## 2018-01-02 NOTE — Telephone Encounter (Signed)
I reviewed her last labs and not for her rehab, and they suggested to continue her current regimen, which consisted of her potassium supplement and her Potassium was 3.7 at that time (low normal).  - By review, she should have potassium supplement, especially if continuing LASIX. --> I have reordered this for her. We will recheck her levels at her next appointment in a few months.

## 2018-01-02 NOTE — Telephone Encounter (Signed)
Spoke with patient's husband reviewed information. Patients husband verbalized understanding.

## 2018-01-03 ENCOUNTER — Encounter: Payer: Self-pay | Admitting: Occupational Therapy

## 2018-01-03 ENCOUNTER — Telehealth: Payer: Self-pay | Admitting: Cardiology

## 2018-01-03 ENCOUNTER — Ambulatory Visit: Payer: BLUE CROSS/BLUE SHIELD | Admitting: Physical Therapy

## 2018-01-03 ENCOUNTER — Ambulatory Visit: Payer: BLUE CROSS/BLUE SHIELD | Admitting: Occupational Therapy

## 2018-01-03 DIAGNOSIS — R2681 Unsteadiness on feet: Secondary | ICD-10-CM

## 2018-01-03 DIAGNOSIS — R278 Other lack of coordination: Secondary | ICD-10-CM

## 2018-01-03 DIAGNOSIS — R293 Abnormal posture: Secondary | ICD-10-CM | POA: Diagnosis not present

## 2018-01-03 DIAGNOSIS — I69351 Hemiplegia and hemiparesis following cerebral infarction affecting right dominant side: Secondary | ICD-10-CM

## 2018-01-03 DIAGNOSIS — M6281 Muscle weakness (generalized): Secondary | ICD-10-CM | POA: Diagnosis not present

## 2018-01-03 DIAGNOSIS — R2689 Other abnormalities of gait and mobility: Secondary | ICD-10-CM

## 2018-01-03 DIAGNOSIS — R600 Localized edema: Secondary | ICD-10-CM

## 2018-01-03 NOTE — Telephone Encounter (Signed)
LMOVM requesting that pt send manual transmission b/c home monitor has not updated in at least 14 days.    

## 2018-01-03 NOTE — Therapy (Signed)
[facility] [address] [place], [place], [address] Phone: [phone]   Fax:  [phone]  Physical Therapy Treatment  Patient Details  Name: [name] MRN: [id number] Date of Birth: Jun 29, 1961 Referring Provider: Delice Lesch MD   Encounter Date: 01/03/2018  PT End of Session - 01/03/18 1240    Visit Number  3    Number of Visits  8    Date for PT Re-Evaluation  12/20/17    Authorization Type  BCBS    Authorization Time Period  Unclear pt's current visit count for 2019 (or visit limit--?24 or 30). 3/27 left message for insurance specialist to assist with clarification; VL =30, used 14 this year with 16 remaining (to share between PT and OT)    Authorization - Visit Number  4    Authorization - Number of Visits  16    PT Start Time  1018    PT Stop Time  1106    PT Time Calculation (min)  48 min    Equipment Utilized During Treatment  Gait belt    Activity Tolerance  Patient limited by pain    Behavior During Therapy  WFL for tasks assessed/performed       Past Medical History:  Diagnosis Date  . Arthritis    "knees" (11/16/2017)  . CAD (coronary artery disease)    a. s/p NSTEMI in 10/2017 with cath showing LM disease --> s/p CABG on 11/23/2017 with LIMA-LAD and SVG-OM.   [name] Hay fever   . Pneumonia 11/16/2017  . Stroke [facility]) 08/23/2017   Left MCA infarct status post TPA and thrombectomy    Past Surgical History:  Procedure Laterality Date  . CORONARY ARTERY BYPASS GRAFT N/A 11/23/2017   Procedure: CORONARY ARTERY BYPASS GRAFTING (CABG) x 2, ON PUMP, USING LEFT INTERNAL MAMMARY ARTERY AND RIGHT GREATER SAPHENOUS VEIN HARVESTED ENDOSCOPICALLY;  Surgeon: Gaye Pollack, MD;  Location: [facility];  Service: Open Heart Surgery;  Laterality: N/A;  . CRYOABLATION     cervical  . IR ANGIO INTRA EXTRACRAN SEL COM CAROTID INNOMINATE UNI R MOD SED  08/21/2017  . IR ANGIO VERTEBRAL SEL SUBCLAVIAN INNOMINATE UNI R MOD  SED  08/21/2017  . IR PERCUTANEOUS ART THROMBECTOMY/INFUSION INTRACRANIAL INC DIAG ANGIO  08/21/2017  . IR RADIOLOGIST EVAL & MGMT  10/30/2017  . LOOP RECORDER INSERTION N/A 08/28/2017   Procedure: LOOP RECORDER INSERTION;  Surgeon: Constance Haw, MD;  Location: [place] CV LAB;  Service: Cardiovascular;  Laterality: N/A;  . RADIOLOGY WITH ANESTHESIA N/A 08/21/2017   Procedure: RADIOLOGY WITH ANESTHESIA;  Surgeon: Luanne Bras, MD;  Location: [facility];  Service: Radiology;  Laterality: N/A;  . RIGHT/LEFT HEART CATH AND CORONARY ANGIOGRAPHY N/A 11/20/2017   Procedure: RIGHT/LEFT HEART CATH AND CORONARY ANGIOGRAPHY;  Surgeon: Jettie Booze, MD;  Location: [place] CV LAB;  Service: Cardiovascular;  Laterality: N/A;  . TEE WITHOUT CARDIOVERSION N/A 08/28/2017   Procedure: TRANSESOPHAGEAL ECHOCARDIOGRAM (TEE);  Surgeon: Fay Records, MD;  Location: [facility];  Service: Cardiovascular;  Laterality: N/A;  . TEE WITHOUT CARDIOVERSION N/A 11/23/2017   Procedure: TRANSESOPHAGEAL ECHOCARDIOGRAM (TEE);  Surgeon: Gaye Pollack, MD;  Location: [facility];  Service: Open Heart Surgery;  Laterality: N/A;  . TUBAL LIGATION      There were no vitals filed for this visit.  Subjective Assessment - 01/03/18 1024    Subjective  Pt is on sternal precautions for 3 months post surgery (11/23/17).    Patient  is accompained by:  Family member    Pertinent History  ischemic cardiomyopathy with CHF and EF 35-40%;  lt CVA 11/18; aspiration pneumonia with hospitalization 10/2017; CABG 11/23/17; arthritis (esp knees)    Limitations  Lifting;Walking    Patient Stated Goals  walk with more coordination and without an assistive device    Currently in Pain?  Yes    Pain Score  2     Pain Location  Knee    Pain Orientation  Right;Left L>R    Pain Descriptors / Indicators  Aching    Pain Onset  More than a month ago    Pain Frequency  Intermittent    Aggravating Factors   walking                        [facility] Adult PT Treatment/Exercise - 01/03/18 0001      Ambulation/Gait   Ambulation/Gait  Yes    Ambulation/Gait Assistance  4: Min guard;5: Supervision    Ambulation/Gait Assistance Details  Min guard with RW and R hand grip and close supervision with quad cane.                                        Ambulation Distance (Feet)  120 Feet x2    Assistive device  Rolling walker;Small based quad cane RW with hand grip    Gait Pattern  Step-through pattern;Decreased stride length;Decreased hip/knee flexion - right;Lateral trunk lean to left;Poor foot clearance - right;Right foot flat;Left foot flat;Decreased hip/knee flexion - left;Scissoring scissoring and L knee and hip flexed with RW    Ambulation Surface  Level;Indoor    Ramp  5: Supervision;4: Min assist supervision with SBQC and minA with RW and R handgrip    Ramp Details (indicate cue type and reason)  greater cues for technique and A needed for RW    Curb  5: Supervision;4: Min assist supervision with SBQC and Min A with RW    Curb Details (indicate cue type and reason)  greater A needed with RW and R handgrip          Balance Exercises - 01/03/18 1232      Balance Exercises: Standing   Standing Eyes Opened  Narrow base of support (BOS);Head turns    Tandem Stance  Eyes open;Intermittent upper extremity support;2 reps;10 secs        PT Education - 01/03/18 1232    Education provided  Yes    Education Details  Reasons for trialing RW with R handgrip vs. SBQC.  Reasons for trialing various standing balance activities to assess  good starting point for balance HEP.  Limitations on balance exercises depending on tolerance to acitvity with left knee pain.  Plan for giving balance HEP next visit.                                                                 Person(s) Educated  Patient;Spouse    Methods  Explanation;Demonstration    Comprehension  Verbalized understanding;Returned demonstration        PT Short Term Goals - 12/20/17 2118      PT SHORT TERM GOAL #  1   Title  verbalize understanding of sternal precautions and CVA risk factors/warning signs (Target all STGs 01/19/18)    Time  4    Period  Weeks    Status  New    Target Date  01/19/18      PT SHORT TERM GOAL #2   Title  amb >= 350' with LRAD on indoor/paved outdoor surfaces modified independent for improved function    Time  4    Period  Weeks    Status  New      PT SHORT TERM GOAL #3   Title  improve timed up and go to < 25 sec with LRAD for improved mobility    Time  4    Period  Weeks    Status  New      PT SHORT TERM GOAL #4   Title  improve gait velocity to at least 1.0 ft/sec for improved mobility    Time  4    Period  Weeks    Status  New        PT Long Term Goals - 12/20/17 2122      PT LONG TERM GOAL #1   Title  independent with HEP (Target all LTGs 02/18/2018)    Time  8    Period  Weeks    Status  New    Target Date  02/18/18      PT LONG TERM GOAL #2   Title  improve BERG balance score to >/= 49/56 for improved balance and decreased fall risk    Time  8    Period  Weeks    Status  New      PT LONG TERM GOAL #3   Title  improve timed up and go to < 20 sec for improved mobility    Time  8    Period  Weeks    Status  New      PT LONG TERM GOAL #4   Title  improve gait velocity to > 1.8 ft/sec for decreased fall risk    Time  8    Period  Weeks    Status  New      PT LONG TERM GOAL #5   Title  amb > 500' on various indoor/outdoor surfaces modified independent with LRAD for 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 Pt walked faster but demonstrated scissoring gait pattern, less control and greater pain especially in L knee. Pt required greater A with RW then [facility].   Balance training:  Pt demonstrated good stability with feet together  on pillow with head turns and eyes closed. Poor tolerance to tandem stance on solid surface reporting L knee pain; greater instability noted.  Discussed plan to give pt balance HEP at counter next visit.                                                                             Rehab  Potential  Good    PT Frequency  1x / week decrease from 2x/wk to 1x/wk    PT Duration  8 weeks may need to adjust based on available remaining visits    PT Treatment/Interventions  ADLs/Self Care Home Management;Electrical Stimulation;Therapeutic exercise;Therapeutic activities;Functional mobility training;Stair training;Gait training;DME Instruction;Neuromuscular re-education;Balance training;Patient/family education;Orthotic Fit/Training;Manual techniques;Vestibular;Taping;Aquatic Therapy;Passive range of motion    PT Next Visit Plan  continue sternal precautions; review s/s of CVA and actions to take; add balance exercises to HEP at counter with light inermittent support ; gait training without quad cane, with quad cane on community type surfaces.    Consulted and Agree with Plan of Care  Patient;Family member/caregiver    Family Member Consulted  husband       Patient will benefit from skilled therapeutic intervention in order to improve the following deficits and impairments:  Abnormal gait, Decreased strength, Decreased balance, Difficulty walking, Decreased mobility, Impaired perceived functional ability, Decreased activity tolerance, Decreased endurance, Decreased knowledge of use of DME, Increased edema, Impaired UE functional use  Visit Diagnosis: Muscle weakness (generalized)  Other abnormalities of gait and mobility     Problem List Patient Active Problem List   Diagnosis Date Noted  . Paroxysmal atrial fibrillation ([place]) 12/19/2017  . Chronic combined systolic and diastolic CHF (congestive heart failure) ([place]) 12/16/2017  . Debility 11/29/2017  . Benign essential HTN   . Acute blood loss anemia    . Tachycardia   . Hyperglycemia   . Supplemental oxygen dependent   . 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 Coronary artery disease 11/23/2017  . Acute on chronic systolic heart failure ([place]) 11/21/2017  . Acute respiratory failure with hypoxia ([place]) 11/16/2017  . Aspiration pneumonia ([place]) 11/16/2017  . NSTEMI (non-ST elevated myocardial infarction) ([place]) 11/16/2017  . Stress and adjustment reaction 10/13/2017  . Urticaria 10/13/2017  . Former smoker 10/13/2017  . Cerebrovascular accident (CVA) due to occlusion of left middle cerebral artery ([place])   . Right hemiplegia ([place])   . Dysphagia, post-stroke   . Hyperlipidemia     Bjorn Loser, PTA  01/03/18, 12:50 PM [facility] [address] [place], [place], [address] Phone: [phone]   Fax:  [phone]  Name: [name] MRN: [id number] Date of Birth: [birth year]

## 2018-01-03 NOTE — Therapy (Signed)
[facility] [address] [place], [place], [address] Phone: [phone]   Fax:  [phone]  Occupational Therapy Treatment  Patient Details  Name: [name] MRN: [id number] Date of Birth: 06-Dec-1960 Referring Provider: Delice Lesch MD   Encounter Date: 01/03/2018  OT End of Session - 01/03/18 1200    Visit Number  3    Number of Visits  8    Date for OT Re-Evaluation  01/20/18    Authorization Type  BC/BS    Authorization - Visit Number  10    Authorization - Number of Visits  16    OT Start Time  [phone]    OT Stop Time  1015    OT Time Calculation (min)  39 min    Activity Tolerance  Patient tolerated treatment well    Behavior During Therapy  [facility] for tasks assessed/performed       Past Medical History:  Diagnosis Date  . Arthritis    "knees" (11/16/2017)  . CAD (coronary artery disease)    a. s/p NSTEMI in 10/2017 with cath showing LM disease --> s/p CABG on 11/23/2017 with LIMA-LAD and SVG-OM.   [name] Hay fever   . Pneumonia 11/16/2017  . Stroke [facility]) 08/23/2017   Left MCA infarct status post TPA and thrombectomy    Past Surgical History:  Procedure Laterality Date  . CORONARY ARTERY BYPASS GRAFT N/A 11/23/2017   Procedure: CORONARY ARTERY BYPASS GRAFTING (CABG) x 2, ON PUMP, USING LEFT INTERNAL MAMMARY ARTERY AND RIGHT GREATER SAPHENOUS VEIN HARVESTED ENDOSCOPICALLY;  Surgeon: Gaye Pollack, MD;  Location: [facility];  Service: Open Heart Surgery;  Laterality: N/A;  . CRYOABLATION     cervical  . IR ANGIO INTRA EXTRACRAN SEL COM CAROTID INNOMINATE UNI R MOD SED  08/21/2017  . IR ANGIO VERTEBRAL SEL SUBCLAVIAN INNOMINATE UNI R MOD SED  08/21/2017  . IR PERCUTANEOUS ART THROMBECTOMY/INFUSION INTRACRANIAL INC DIAG ANGIO  08/21/2017  . IR RADIOLOGIST EVAL & MGMT  10/30/2017  . LOOP RECORDER INSERTION N/A 08/28/2017   Procedure: LOOP RECORDER INSERTION;  Surgeon: Constance Haw, MD;  Location: [place]  CV LAB;  Service: Cardiovascular;  Laterality: N/A;  . RADIOLOGY WITH ANESTHESIA N/A 08/21/2017   Procedure: RADIOLOGY WITH ANESTHESIA;  Surgeon: Luanne Bras, MD;  Location: [place];  Service: Radiology;  Laterality: N/A;  . RIGHT/LEFT HEART CATH AND CORONARY ANGIOGRAPHY N/A 11/20/2017   Procedure: RIGHT/LEFT HEART CATH AND CORONARY ANGIOGRAPHY;  Surgeon: Jettie Booze, MD;  Location: [place] CV LAB;  Service: Cardiovascular;  Laterality: N/A;  . TEE WITHOUT CARDIOVERSION N/A 08/28/2017   Procedure: TRANSESOPHAGEAL ECHOCARDIOGRAM (TEE);  Surgeon: Fay Records, MD;  Location: [facility];  Service: Cardiovascular;  Laterality: N/A;  . TEE WITHOUT CARDIOVERSION N/A 11/23/2017   Procedure: TRANSESOPHAGEAL ECHOCARDIOGRAM (TEE);  Surgeon: Gaye Pollack, MD;  Location: [place];  Service: Open Heart Surgery;  Laterality: N/A;  . TUBAL LIGATION      There were no vitals filed for this visit.  Subjective Assessment - 01/03/18 0941    Subjective   Some of this medicine makes me feel so weak.      Patient is accompained by:  Family member Husband     Pertinent History  recent CABG x 2 11/23/17, Lt CVA 07/2017. PMH: CAD, HTN, MI    Limitations  sternal precautions including no lifting > 5 lbs, *loop recorder, fall risk, no driving    Patient Stated Goals  Get  my arm working    Currently in Pain?  Yes    Pain Score  4     Pain Location  Sternum    Pain Orientation  Medial    Pain Descriptors / Indicators  Aching    Pain Type  Surgical pain    Pain Onset  More than a month ago    Pain Frequency  Constant                   OT Treatments/Exercises (OP) - 01/03/18 0001      ADLs   Eating  Reviewed one handed cutting, and cutting board.  Patient and husband both indicated that they were familiar and would pursue as needed.      Functional Mobility  Patient is being considered for cardiac rehab possibly in June.  PT anticiaptes she may need walking splint on walker for  particiaption in cardica rehab.  Set up riolling walker with hand splint prior to PT appointment today.  Discussed difference between physical rehab with PT/OT and cardiac rehab - with patient and husband.        Neurological Re-education Exercises   Other Exercises 1  Reviewed HEP and provided feedback to patient and husband.  Patient able to demonstrate safe performance of exercises in the session with intermittent cueing.  Husband attentive.      Other Exercises 2  With visual attention, following passive stretch,. patient able to demonstrate active forearm supination after several attempts.  Patient needs cueing to slow down, and allow her body tine to respond.        Manual Therapy   Manual Therapy  Edema management    Edema Management  Demonstrated positioning  to help reduce swelling in right hand.  Demonstrated edema massage, patient able to demo back with minimal cueing.  Husband present as well.               OT Education - 01/03/18 1159    Education provided  Yes    Education Details  Reviewed HEP and provided feedback for performance    Person(s) Educated  Patient;Spouse    Methods  Explanation;Demonstration    Comprehension  Verbalized understanding;Returned demonstration;Need further instruction       OT Short Term Goals - 12/20/17 1857      OT SHORT TERM GOAL #1   Title  Independent with revised HEP     Time  4    Period  Weeks    Status  Revised    Target Date  01/20/18      OT SHORT TERM GOAL #2   Title  Pt to demo 25 degrees shoulder flexion in prep for low level reaching    Time  4    Period  Weeks    Status  On-going      OT SHORT TERM GOAL #3   Title  Pt to demo 75% finger flexion and 10% finger extension in prep for functional grasp/release    Time  4    Period  Weeks    Status  Revised      OT SHORT TERM GOAL #4   Title  Pt/family to verbalize understanding with edema management strategies Rt hand and positioning for Rt shoulder to minimize pain     Time  4    Period  Weeks    Status  Revised      OT SHORT TERM GOAL #5   Title  Pt to verbalize understanding with A/E  needs and task modifications to increased independence with ADLS/IADLS    Time  4    Period  Weeks    Status  Revised        OT Long Term Goals - 12/25/17 1223      OT LONG TERM GOAL #1   Title  Independent with updated HEP    Time  4    Period  Weeks    Status  New    Target Date  01/20/18      OT LONG TERM GOAL #2   Title  Pt to verbalize understanding with edema management strategies Rt hand and positioning for Rt shoulder to minimize pain    Time  4    Period  Weeks    Status  New      OT LONG TERM GOAL #3   Title  Pt to verbalize understanding with A/E needs and task modifications to increase independence with ADLS/IADLS    Time  4    Period  Weeks    Status  New      OT LONG TERM GOAL #4   Title  Pt to make sandwich/snack/microwaveable meals consistently at mod I level using A/E prn    Time  4    Period  Weeks    Status  New      OT LONG TERM GOAL #5   Title  Pt to perform light housework (laundry, dishes) at modified independence level    Time  4    Period  Weeks    Status  New            Plan - 01/03/18 1200    Clinical Impression Statement  Patient somewhat limited by sternal precautions, sternal pain, fatigue, and shoulder pain.  Patient progressing with functional mobility.     Occupational Profile and client history currently impacting functional performance  Recent CABG x 2 11/23/17, CVA 07/2017, MI, CAD    Occupational performance deficits (Please refer to evaluation for details):  ADL's;IADL's;Social Participation;Other    Rehab Potential  Good    OT Frequency  2x / week    OT Duration  4 weeks    OT Treatment/Interventions  Self-care/ADL training;Moist Heat;Fluidtherapy;DME and/or AE instruction;Splinting;Aquatic Therapy;Therapeutic activities;Compression bandaging;Other (comment);Therapeutic exercise;Neuromuscular  education;Functional Mobility Training;Passive range of motion;Visual/perceptual remediation/compensation;Manual Therapy;Electrical Stimulation;Energy conservation;Patient/family education    Plan  continue NMR while adhering to sternal precautions, show one handed cutting board and pot stabalizer, begin to address LTG #2    Clinical Decision Making  Several treatment options, min-mod task modification necessary    Consulted and Agree with Plan of Care  Patient;Family member/caregiver    Family Member Consulted  husband       Patient will benefit from skilled therapeutic intervention in order to improve the following deficits and impairments:  Decreased coordination, Decreased range of motion, Difficulty walking, Decreased endurance, Impaired tone, Decreased activity tolerance, Decreased knowledge of precautions, Decreased balance, Decreased knowledge of use of DME, Impaired UE functional use, Pain, Decreased mobility, Decreased strength  Visit Diagnosis: Hemiplegia and hemiparesis following cerebral infarction affecting right dominant side (HCC)  Localized edema  Abnormal posture  Muscle weakness (generalized)  Other lack of coordination  Unsteadiness on feet    Problem List Patient Active Problem List   Diagnosis Date Noted  . Paroxysmal atrial fibrillation ([place]) 12/19/2017  . Chronic combined systolic and diastolic CHF (congestive heart failure) ([place]) 12/16/2017  . Debility 11/29/2017  . Benign essential HTN   . Acute blood  loss anemia   . Tachycardia   . Hyperglycemia   . Supplemental oxygen dependent   . 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 Coronary artery disease 11/23/2017  . Acute on chronic systolic heart failure ([place]) 11/21/2017  . Acute respiratory failure with hypoxia ([place]) 11/16/2017  . Aspiration pneumonia ([place]) 11/16/2017  . NSTEMI (non-ST elevated myocardial infarction) ([place]) 11/16/2017  . Stress and adjustment reaction 10/13/2017  . Urticaria 10/13/2017  . Former  smoker 10/13/2017  . Cerebrovascular accident (CVA) due to occlusion of left middle cerebral artery ([place])   . Right hemiplegia ([place])   . Dysphagia, post-stroke   . Hyperlipidemia     Mariah Milling, OTR/L 01/03/2018, 12:03 PM  [facility] [address] [place], [place], [address] Phone: [phone]   Fax:  [phone]  Name: [name] MRN: [id number] Date of Birth: 05/03/1961

## 2018-01-08 ENCOUNTER — Ambulatory Visit: Payer: BLUE CROSS/BLUE SHIELD | Admitting: Family Medicine

## 2018-01-08 ENCOUNTER — Encounter: Payer: Self-pay | Admitting: Family Medicine

## 2018-01-08 VITALS — BP 114/73 | HR 90 | Temp 97.5°F | Resp 18 | Ht 61.0 in | Wt 115.0 lb

## 2018-01-08 DIAGNOSIS — E876 Hypokalemia: Secondary | ICD-10-CM

## 2018-01-08 DIAGNOSIS — M25562 Pain in left knee: Secondary | ICD-10-CM

## 2018-01-08 DIAGNOSIS — M25561 Pain in right knee: Secondary | ICD-10-CM | POA: Diagnosis not present

## 2018-01-08 DIAGNOSIS — M1990 Unspecified osteoarthritis, unspecified site: Secondary | ICD-10-CM | POA: Diagnosis not present

## 2018-01-08 MED ORDER — TRAMADOL HCL 50 MG PO TABS: 50.0000 mg | ORAL_TABLET | Freq: Two times a day (BID) | ORAL | 2 refills | Status: DC | PRN

## 2018-01-08 NOTE — Progress Notes (Signed)
[name] , 08-04-1961, 57 y.o., female MRN: [id number] Patient Care Team    Relationship Specialty Notifications Start End  [name], DO PCP - General Family Medicine  10/13/17   Satira Sark, MD PCP - Cardiology Cardiology Admissions 12/15/17   Constance Haw, MD Consulting Physician Cardiology  10/13/17   Rosalin Hawking, MD Consulting Physician Neurology  10/13/17   Jamse Arn, MD Consulting Physician Physical Medicine and Rehabilitation  10/13/17   Luanne Bras, MD Consulting Physician Interventional Radiology  10/13/17     Chief Complaint  Patient presents with  . Medication Reaction    possible lasix patient c/o SHOB,wheezing cough and weight loss  . Knee Pain    bilateral     Subjective: Pt presents for an OV with multiple complaints.   Bilateral knee pain: Pt reports she has bilateral knee pain, left > right. The pain is worse since PT for her stroke. She feels she injured the left knee with aggressive PT that had her using a walker and walking without assistance. She was very nervous not to have her cane. She feels her left knee is swollen and tender. She feels both knees are unstable and want to give out. She denies any falls. She is on a blood thinner for her stroke.   * concern about potassium causing itching, concern about lasix, Pepcid tramadol, colace, zyrtec  and vistaril. She is under the care of cardiology for her heart and stroke. She is no longer having difficulties with constipation, she reports she never had problems with her stomach, she reports she does have pain in her knees. She uses the vistaril occasionally for itching, which she has, but does not want vistaril on her list.   Depression screen [facility] 2/9 11/10/2017 11/10/2017 10/13/2017 10/05/2017 10/05/2017  Decreased Interest 0 0 0 1 0  Down, Depressed, Hopeless 0 0 0 0 0  PHQ - 2 Score 0 0 0 1 0  Altered sleeping 0 - - 0 -  Tired, decreased energy 0 - - 2 -  Change in appetite 0  - - 0 -  Feeling bad or failure about yourself  0 - - 0 -  Trouble concentrating 0 - - 1 -  Moving slowly or fidgety/restless 0 - - 1 -  Suicidal thoughts 0 - - 0 -  PHQ-9 Score 0 - - 5 -  Difficult doing work/chores Not difficult at all - - Not difficult at all -    Allergies  Allergen Reactions  . Lopressor [Metoprolol Tartrate] Swelling    Angioedema  . Lipitor [Atorvastatin] Diarrhea   Social History   Tobacco Use  . Smoking status: Former Smoker    Packs/day: 0.12    Years: 37.00    Pack years: 4.44    Types: Cigarettes    Last attempt to quit: 08/21/2017    Years since quitting: 0.3  . Smokeless tobacco: Never Used  Substance Use Topics  . Alcohol use: No    Frequency: Never   Past Medical History:  Diagnosis Date  . Arthritis    "knees" (11/16/2017)  . CAD (coronary artery disease)    a. s/p NSTEMI in 10/2017 with cath showing LM disease --> s/p CABG on 11/23/2017 with LIMA-LAD and SVG-OM.   [name] Hay fever   . Pneumonia 11/16/2017  . 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 CORONARY  ARTERY BYPASS GRAFT N/A 11/23/2017   Procedure: CORONARY ARTERY BYPASS GRAFTING (CABG) x 2, ON PUMP, USING LEFT INTERNAL MAMMARY ARTERY AND RIGHT GREATER SAPHENOUS VEIN HARVESTED ENDOSCOPICALLY;  Surgeon: Gaye Pollack, MD;  Location: [place];  Service: Open Heart Surgery;  Laterality: N/A;  . CRYOABLATION     cervical  . IR ANGIO INTRA EXTRACRAN SEL COM CAROTID INNOMINATE UNI R MOD SED  08/21/2017  . IR ANGIO VERTEBRAL SEL SUBCLAVIAN INNOMINATE UNI R MOD SED  08/21/2017  . IR PERCUTANEOUS ART THROMBECTOMY/INFUSION INTRACRANIAL INC DIAG ANGIO  08/21/2017  . IR RADIOLOGIST EVAL & MGMT  10/30/2017  . LOOP RECORDER INSERTION N/A 08/28/2017   Procedure: LOOP RECORDER INSERTION;  Surgeon: Constance Haw, MD;  Location: [place] CV [facility];  Service: Cardiovascular;  Laterality: N/A;  . RADIOLOGY  WITH ANESTHESIA N/A 08/21/2017   Procedure: RADIOLOGY WITH ANESTHESIA;  Surgeon: Luanne Bras, MD;  Location: [place];  Service: Radiology;  Laterality: N/A;  . RIGHT/LEFT HEART CATH AND CORONARY ANGIOGRAPHY N/A 11/20/2017   Procedure: RIGHT/LEFT HEART CATH AND CORONARY ANGIOGRAPHY;  Surgeon: Jettie Booze, MD;  Location: [place] CV [facility];  Service: Cardiovascular;  Laterality: N/A;  . TEE WITHOUT CARDIOVERSION N/A 08/28/2017   Procedure: TRANSESOPHAGEAL ECHOCARDIOGRAM (TEE);  Surgeon: Fay Records, MD;  Location: [place];  Service: Cardiovascular;  Laterality: N/A;  . TEE WITHOUT CARDIOVERSION N/A 11/23/2017   Procedure: TRANSESOPHAGEAL ECHOCARDIOGRAM (TEE);  Surgeon: Gaye Pollack, MD;  Location: [place];  Service: Open Heart Surgery;  Laterality: N/A;  . TUBAL LIGATION     Family History  Problem Relation Age of Onset  . Heart attack Father   . Hypertension Mother   . Hyperlipidemia Mother   . Diabetes type II Mother   . Hypertension Brother    Allergies as of 01/08/2018      Reactions   Lopressor [metoprolol Tartrate] Swelling   Angioedema   Lipitor [atorvastatin] Diarrhea      Medication List        Accurate as of 01/08/18  4:07 PM. Always use your most recent med list.          acetaminophen 325 MG tablet Commonly known as:  TYLENOL Take 650 mg by mouth every 6 (six) hours as needed.   carvedilol 6.25 MG tablet Commonly known as:  COREG Take 1 tablet (6.25 mg total) by mouth 2 (two) times daily with a meal.   escitalopram 10 MG tablet Commonly known as:  LEXAPRO Take 1 tablet (10 mg total) by mouth daily.   famotidine 20 MG tablet Commonly known as:  PEPCID Take 1 tablet (20 mg total) by mouth 2 (two) times daily.   furosemide 20 MG tablet Commonly known as:  LASIX Take 1 tablet (20 mg total) by mouth daily.   Potassium Chloride ER 20 MEQ Tbcr Take 20 mEq by mouth daily.   rivaroxaban 20 MG Tabs tablet Commonly known as:  XARELTO Take 1  tablet (20 mg total) by mouth daily with supper.   rosuvastatin 20 MG tablet Commonly known as:  CRESTOR Take 1 tablet (20 mg total) by mouth daily.   traMADol 50 MG tablet Commonly known as:  ULTRAM Take 1 tablet (50 mg total) by mouth every 12 (twelve) hours as needed for moderate pain.       All past medical history, surgical history, allergies, family history, immunizations andmedications were updated in the EMR today and reviewed under the history and medication portions of their EMR.  ROS: Negative, with the exception of above mentioned in HPI   Objective:  BP 114/73 (BP Location: Left Arm, Patient Position: Sitting, Cuff Size: Normal)   Pulse 90   Temp (!) 97.5 F (36.4 C)   Resp 18   Ht 5\' 1"  (1.549 m)   Wt 115 lb (52.2 kg)   LMP  (LMP Unknown)   SpO2 97%   BMI 21.73 kg/m  Body mass index is 21.73 kg/m. Gen: Afebrile. No acute distress. Nontoxic in appearance, well developed, well nourished.  HENT: AT. [place]. MMM Eyes:Pupils Equal Round Reactive to light, Extraocular movements intact,  Conjunctiva without redness, discharge or icterus. MSK (bilateral knees): no erythema, no effusion, soft tissue swelling left anterior medial knee, crepitus bilateral knee on extension R>L. Full ROM. No ligament laxity.  Neuro: Unchanged  Gait with cane after stroke. PERLA. EOMi. Alert. Oriented x3  No exam data present No results found. No results found for this or any previous visit (from the past 24 hour(s)).  Assessment/Plan: PENNI PENADO is a 57 y.o. female present for OV for  Acute hypokalemia Collect BMP, will address potassium depending upon results.  - Continue lasix as directed. Discuss with cardiologist if she does not want to use any longer. Provided her with her prior echo results and heart function and advised her she will need at least the majority of her meds for life, for her protection.   Osteoarthritis, unspecified osteoarthritis type, unspecified  site Definitely has arthritis in bilateral knees by exam. Left knee currently mildly swollen. Concern for meniscal injury in right knee.  Really not a great candidate for oral steroids and NSAIDS contraindicated with blood thinner.  - tramadol prescribed for pain. Contract signed today. [place] reviewed. - Orthopedic referral placed to discuss further options.   * pt encounter today seemed to be focused on her not wanting to take medications or at least remove them from her list. She was given a list of acceptable medications to discontinue if she wanted, the remainder medications are strongly discouraged to discontinue .   Reviewed expectations re: course of current medical issues.  Discussed self-management of symptoms.  Outlined signs and symptoms indicating need for more acute intervention.  Patient verbalized understanding and all questions were answered.  Patient received an After-Visit Summary.    Orders Placed This Encounter  Procedures  . Basic Metabolic Panel (BMET)     Note is dictated utilizing voice recognition software. Although note has been proof read prior to signing, occasional typographical errors still can be missed. If any questions arise, please do not hesitate to call for verification.   electronically signed by:  Howard Pouch, DO  [place]

## 2018-01-08 NOTE — Patient Instructions (Signed)
Continue the medications as prescribed for your heart and stroke.  You can make up your mind if you want to continue the medications with a star next to them on your list today.  You should understand you will always require some medicine daily for your heart and stroke condition. I understand it is not what you really want to do, but it is going to help keep you from repeat events (strokes etc). No medication is forced on you, but they are recommended by healthcare professional to help protect you.  If you have further questions surround ing your lasix dose this should go to your cardiologist for review.   I will refer you to orthopedics to discuss your knee pain. They may be able to offer you injections. Until then you can take tramadol for your knee pain and pain associated with PT.   We will call you with results of your lab and guide you on potassium dosing.

## 2018-01-09 ENCOUNTER — Ambulatory Visit: Payer: BLUE CROSS/BLUE SHIELD | Admitting: Physical Therapy

## 2018-01-09 ENCOUNTER — Encounter: Payer: BLUE CROSS/BLUE SHIELD | Admitting: Family Medicine

## 2018-01-09 ENCOUNTER — Ambulatory Visit: Payer: BLUE CROSS/BLUE SHIELD | Admitting: Occupational Therapy

## 2018-01-09 ENCOUNTER — Encounter: Payer: Self-pay | Admitting: Physical Therapy

## 2018-01-09 DIAGNOSIS — R2681 Unsteadiness on feet: Secondary | ICD-10-CM

## 2018-01-09 DIAGNOSIS — M6281 Muscle weakness (generalized): Secondary | ICD-10-CM

## 2018-01-09 DIAGNOSIS — R2689 Other abnormalities of gait and mobility: Secondary | ICD-10-CM | POA: Diagnosis not present

## 2018-01-09 DIAGNOSIS — R293 Abnormal posture: Secondary | ICD-10-CM | POA: Diagnosis not present

## 2018-01-09 DIAGNOSIS — I69351 Hemiplegia and hemiparesis following cerebral infarction affecting right dominant side: Secondary | ICD-10-CM

## 2018-01-09 DIAGNOSIS — R278 Other lack of coordination: Secondary | ICD-10-CM | POA: Diagnosis not present

## 2018-01-09 DIAGNOSIS — R600 Localized edema: Secondary | ICD-10-CM | POA: Diagnosis not present

## 2018-01-09 LAB — CUP PACEART REMOTE DEVICE CHECK
Date Time Interrogation Session: 20190306193951
MDC IDC PG IMPLANT DT: 20181203

## 2018-01-09 LAB — BASIC METABOLIC PANEL
BUN: 17 mg/dL (ref 7–25)
CALCIUM: 10.1 mg/dL (ref 8.6–10.4)
CHLORIDE: 104 mmol/L (ref 98–110)
CO2: 31 mmol/L (ref 20–32)
Creat: 0.79 mg/dL (ref 0.50–1.05)
GLUCOSE: 99 mg/dL (ref 65–99)
Potassium: 4.5 mmol/L (ref 3.5–5.3)
SODIUM: 143 mmol/L (ref 135–146)

## 2018-01-09 NOTE — Patient Instructions (Signed)
KNEE: Extension, Long Arc Quads - Sitting    Prior to getting up to walk, raise leg until knee is almost straight. Then lower and lift the other leg. Repeat at least 5 on each leg to "get the joint juice flowing."

## 2018-01-10 ENCOUNTER — Telehealth: Payer: Self-pay | Admitting: Family Medicine

## 2018-01-10 DIAGNOSIS — M25562 Pain in left knee: Principal | ICD-10-CM

## 2018-01-10 DIAGNOSIS — M25561 Pain in right knee: Secondary | ICD-10-CM

## 2018-01-10 NOTE — Telephone Encounter (Signed)
Left detailed message with information on patient voice mail per DPR. 

## 2018-01-10 NOTE — Telephone Encounter (Signed)
Copied from [place] [id number]. Topic: Referral - Request >> Jan 10, 2018  2:13 PM Robina Ade, Helene Kelp D wrote: Reason for CRM: Patient called to have a referral to [place] for knee pain. Please call patient back, thanks.

## 2018-01-10 NOTE — Telephone Encounter (Signed)
Placed.

## 2018-01-10 NOTE — Therapy (Signed)
[facility] [address] [place], [place], [address] Phone: [phone]   Fax:  [phone]  Physical Therapy Treatment  Patient Details  Name: NAKSHATRA KLOSE MRN: [id number] Date of Birth: 03/23/1961 Referring Provider: Delice Lesch MD   Encounter Date: 01/09/2018  PT End of Session - 01/09/18 1706    Visit Number  4    Number of Visits  8    Date for PT Re-Evaluation  12/20/17    Authorization Type  [employer]    Authorization Time Period  Unclear pt's current visit count for 2019 (or visit limit--?24 or 30). 3/27 left message for insurance specialist to assist with clarification; VL =30, used 14 this year with 16 remaining (to share between PT and OT)    Authorization - Visit Number  6 PT & OT combined    Authorization - Number of Visits  16 PT & OT combined    PT Start Time  1448    PT Stop Time  1530    PT Time Calculation (min)  42 min    Equipment Utilized During Treatment  Gait belt    Activity Tolerance  Patient limited by pain    Behavior During Therapy  WFL for tasks assessed/performed       Past Medical History:  Diagnosis Date  . Arthritis    "knees" (11/16/2017)  . CAD (coronary artery disease)    a. s/p NSTEMI in 10/2017 with cath showing LM disease --> s/p CABG on 11/23/2017 with LIMA-LAD and SVG-OM.   [name] Hay fever   . Pneumonia 11/16/2017  . Stroke [facility]) 08/23/2017   Left MCA infarct status post TPA and thrombectomy    Past Surgical History:  Procedure Laterality Date  . CORONARY ARTERY BYPASS GRAFT N/A 11/23/2017   Procedure: CORONARY ARTERY BYPASS GRAFTING (CABG) x 2, ON PUMP, USING LEFT INTERNAL MAMMARY ARTERY AND RIGHT GREATER SAPHENOUS VEIN HARVESTED ENDOSCOPICALLY;  Surgeon: Gaye Pollack, MD;  Location: [place];  Service: Open Heart Surgery;  Laterality: N/A;  . CRYOABLATION     cervical  . IR ANGIO INTRA EXTRACRAN SEL COM CAROTID INNOMINATE UNI R MOD SED  08/21/2017  . IR ANGIO VERTEBRAL  SEL SUBCLAVIAN INNOMINATE UNI R MOD SED  08/21/2017  . IR PERCUTANEOUS ART THROMBECTOMY/INFUSION INTRACRANIAL INC DIAG ANGIO  08/21/2017  . IR RADIOLOGIST EVAL & MGMT  10/30/2017  . LOOP RECORDER INSERTION N/A 08/28/2017   Procedure: LOOP RECORDER INSERTION;  Surgeon: Constance Haw, MD;  Location: [place] CV LAB;  Service: Cardiovascular;  Laterality: N/A;  . RADIOLOGY WITH ANESTHESIA N/A 08/21/2017   Procedure: RADIOLOGY WITH ANESTHESIA;  Surgeon: Luanne Bras, MD;  Location: [facility];  Service: Radiology;  Laterality: N/A;  . RIGHT/LEFT HEART CATH AND CORONARY ANGIOGRAPHY N/A 11/20/2017   Procedure: RIGHT/LEFT HEART CATH AND CORONARY ANGIOGRAPHY;  Surgeon: Jettie Booze, MD;  Location: [place] [facility];  Service: Cardiovascular;  Laterality: N/A;  . TEE WITHOUT CARDIOVERSION N/A 08/28/2017   Procedure: TRANSESOPHAGEAL ECHOCARDIOGRAM (TEE);  Surgeon: Fay Records, MD;  Location: [place];  Service: Cardiovascular;  Laterality: N/A;  . TEE WITHOUT CARDIOVERSION N/A 11/23/2017   Procedure: TRANSESOPHAGEAL ECHOCARDIOGRAM (TEE);  Surgeon: Gaye Pollack, MD;  Location: [facility];  Service: Open Heart Surgery;  Laterality: N/A;  . TUBAL LIGATION      There were no vitals filed for this visit.  Subjective Assessment - 01/09/18 1459    Subjective  Pt is on sternal precautions for 3  months post surgery (11/23/17).    Patient is accompained by:  Family member    Pertinent History  ischemic cardiomyopathy with CHF and EF 35-40%;  lt CVA 11/18; aspiration pneumonia with hospitalization 10/2017; CABG 11/23/17; arthritis (esp knees)    Limitations  Lifting;Walking    Patient Stated Goals  walk with more coordination and without an assistive device    Currently in Pain?  Yes    Pain Score  8     Pain Location  Knee    Pain Orientation  Left    Pain Descriptors / Indicators  Aching    Pain Type  Acute pain acute exacerbation of chronic pain    Pain Onset  In the past 7 days    Pain  Frequency  Intermittent    Aggravating Factors   standing, walking    Pain Relieving Factors  pain medicine, seated with rt knee crossed over left knee                       [facility] Adult PT Treatment/Exercise - 01/09/18 1654      Transfers   Transfers  Sit to Stand;Stand to Sit    Sit to Stand  6: Modified independent (Device/Increase time);Without upper extremity assist    Stand to Sit  6: Modified independent (Device/Increase time);Without upper extremity assist    Comments  various surfaces during session      Ambulation/Gait   Ambulation/Gait Assistance  5: Supervision;6: Modified independent (Device/Increase time)    Ambulation/Gait Assistance Details  pt refused to try to use RW again reporting she hurt her knee when she used it last week because she walked too fast and crossed her feet up    Ambulation Distance (Feet)  120 Feet x2 ; seated breaks between sets   Assistive device  Small based quad cane    Gait Pattern  Step-to pattern;Step-through pattern;Decreased stride length;Decreased weight shift to right;Right foot flat;Lateral trunk lean to left;Poor foot clearance - right    Ambulation Surface  Indoor      Knee/Hip Exercises: Aerobic   Nustep  L1 x 6 minutes with LUE and bil LEs; vc for keeping RLE adducted/internally rotated (otherwise rt knee drifts out to the side)      Instructed in gentle AROM bil knees prior to standing/walking to "prime the system"/warm-up to help reduce knee pain (especially after extended time sitting)        PT Education - 01/09/18 1700    Education Details  Increased time discussing  potential for Phase II Cardiac Rehab participation (RN liason contacted PT to see if pt would be mobile enough to use their equipment). Explained their preference for her to push a RW or wheelchair (for safety) when walking on their track and this was part of the reason PTA attempted RW last visit (also because RW would allow more symmetrical,  consecutive stepping with improved gait velocity/efficiency). Explained Cardiac Rehab would begin after she completes her OP Neurorehab and beneifts of the program (exercise and educational sessions).     Person(s) Educated  Patient;Spouse    Methods  Explanation    Comprehension  Verbalized understanding       PT Short Term Goals - 01/10/18 1715      PT SHORT TERM GOAL #1   Title  verbalize understanding of sternal precautions and CVA risk factors/warning signs (Target all STGs 01/19/18)    Time  4    Period  Weeks  Status  New      PT SHORT TERM GOAL #2   Title  amb >= 350' with LRAD on indoor/paved outdoor surfaces modified independent for improved function    Time  4    Period  Weeks    Status  New      PT SHORT TERM GOAL #3   Title  improve timed up and go to < 25 sec with LRAD for improved mobility    Time  4    Period  Weeks    Status  New      PT SHORT TERM GOAL #4   Title  improve gait velocity to at least 1.0 ft/sec for improved mobility    Time  4    Period  Weeks    Status  New        PT Long Term Goals - 12/20/17 2122      PT LONG TERM GOAL #1   Title  independent with HEP (Target all LTGs 02/18/2018)    Time  8    Period  Weeks    Status  New    Target Date  02/18/18      PT LONG TERM GOAL #2   Title  improve BERG balance score to >/= 49/56 for improved balance and decreased fall risk    Time  8    Period  Weeks    Status  New      PT LONG TERM GOAL #3   Title  improve timed up and go to < 20 sec for improved mobility    Time  8    Period  Weeks    Status  New      PT LONG TERM GOAL #4   Title  improve gait velocity to > 1.8 ft/sec for decreased fall risk    Time  8    Period  Weeks    Status  New      PT LONG TERM GOAL #5   Title  amb > 500' on various indoor/outdoor surfaces modified independent with LRAD for 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 Explained rationale for RW (including possible need to use with Phase II Cardiac Rehab) and pt understanding. Increased time educating on potential post-discharge plan for Phase II Cardiac Rehab and general information about the sessions. Pateint expressed interest in classes and states she knows she could use a recumbent bicycle. Remainder of session focused on importance of warm-up of LEs/knees prior to exercise and gait training (with consideration for incr knee pain). Next session will focus on balance ex's she can do at counter.     Rehab Potential  Good    PT Frequency  1x / week decrease from 2x/wk to 1x/wk    PT Duration  8 weeks may need to adjust based on available remaining visits    PT Treatment/Interventions  ADLs/Self Care Home Management;Electrical Stimulation;Therapeutic exercise;Therapeutic activities;Functional mobility training;Stair training;Gait training;DME Instruction;Neuromuscular re-education;Balance training;Patient/family education;Orthotic Fit/Training;Manual techniques;Vestibular;Taping;Aquatic Therapy;Passive range of motion    PT Next Visit Plan  continue sternal precautions; CHECK STGs next visit (not done 4th visit due to severe knee pain and incr time spent discussing  potential cardiac rehab);  review s/s of CVA and actions to take; add balance exercises to HEP at counter with light inermittent support ; gait training with quad cane on community type surfaces.(ramp, curb, sidewalk)    Consulted and Agree with Plan of Care  Patient;Family member/caregiver    Family Member Consulted  husband       Patient will benefit from skilled therapeutic intervention in order to improve the following deficits and impairments:  Abnormal gait, Decreased strength, Decreased balance, Difficulty walking, Decreased mobility, Impaired perceived functional ability,  Decreased activity tolerance, Decreased endurance, Decreased knowledge of use of DME, Increased edema, Impaired UE functional use  Visit Diagnosis: Hemiplegia and hemiparesis following cerebral infarction affecting right dominant side (HCC)  Muscle weakness (generalized)  Unsteadiness on feet     Problem List Patient Active Problem List   Diagnosis Date Noted  . Paroxysmal atrial fibrillation ([place]) 12/19/2017  . Chronic combined systolic and diastolic CHF (congestive heart failure) ([place]) 12/16/2017  . Debility 11/29/2017  . Benign essential HTN   . Acute blood loss anemia   . Tachycardia   . Hyperglycemia   . Supplemental oxygen dependent   . 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 Coronary artery disease 11/23/2017  . Acute on chronic systolic heart failure ([place]) 11/21/2017  . Acute respiratory failure with hypoxia ([place]) 11/16/2017  . Aspiration pneumonia ([place]) 11/16/2017  . NSTEMI (non-ST elevated myocardial infarction) ([place]) 11/16/2017  . Stress and adjustment reaction 10/13/2017  . Urticaria 10/13/2017  . Former smoker 10/13/2017  . Cerebrovascular accident (CVA) due to occlusion of left middle cerebral artery ([place])   . Right hemiplegia ([place])   . Dysphagia, post-stroke   . Hyperlipidemia     Rexanne Mano, PT 01/10/2018, 5:24 PM  [facility] [address] [place], [place], [address] Phone: [phone]   Fax:  [phone]  Name: KEYONDRA LAGRAND MRN: [id number] Date of Birth: 01-07-1961

## 2018-01-10 NOTE — Therapy (Signed)
[facility] [address] [place], [place], [address] Phone: [phone]   Fax:  [phone]  Occupational Therapy Treatment  Patient Details  Name: [name] MRN: [id number] Date of Birth: 1961-04-07 Referring Provider: Delice Lesch MD   Encounter Date: 01/09/2018  OT End of Session - 01/10/18 1603    Visit Number  4    Number of Visits  8    Date for OT Re-Evaluation  01/20/18    Authorization Type  BC/BS    Authorization - Visit Number  11    Authorization - Number of Visits  16    OT Start Time  2774    OT Stop Time  1620    OT Time Calculation (min)  40 min       Past Medical History:  Diagnosis Date  . Arthritis    "knees" (11/16/2017)  . CAD (coronary artery disease)    a. s/p NSTEMI in 10/2017 with cath showing LM disease --> s/p CABG on 11/23/2017 with LIMA-LAD and SVG-OM.   [name] Hay fever   . Pneumonia 11/16/2017  . Stroke [facility]) 08/23/2017   Left MCA infarct status post TPA and thrombectomy    Past Surgical History:  Procedure Laterality Date  . CORONARY ARTERY BYPASS GRAFT N/A 11/23/2017   Procedure: CORONARY ARTERY BYPASS GRAFTING (CABG) x 2, ON PUMP, USING LEFT INTERNAL MAMMARY ARTERY AND RIGHT GREATER SAPHENOUS VEIN HARVESTED ENDOSCOPICALLY;  Surgeon: Gaye Pollack, MD;  Location: [place] [facility];  Service: Open Heart Surgery;  Laterality: N/A;  . CRYOABLATION     cervical  . IR ANGIO INTRA EXTRACRAN SEL COM CAROTID INNOMINATE UNI R MOD SED  08/21/2017  . IR ANGIO VERTEBRAL SEL SUBCLAVIAN INNOMINATE UNI R MOD SED  08/21/2017  . IR PERCUTANEOUS ART THROMBECTOMY/INFUSION INTRACRANIAL INC DIAG ANGIO  08/21/2017  . IR RADIOLOGIST EVAL & MGMT  10/30/2017  . LOOP RECORDER INSERTION N/A 08/28/2017   Procedure: LOOP RECORDER INSERTION;  Surgeon: Constance Haw, MD;  Location: [place] [facility];  Service: Cardiovascular;  Laterality: N/A;  . RADIOLOGY WITH ANESTHESIA N/A 08/21/2017   Procedure: RADIOLOGY  WITH ANESTHESIA;  Surgeon: Luanne Bras, MD;  Location: [place];  Service: Radiology;  Laterality: N/A;  . RIGHT/LEFT HEART CATH AND CORONARY ANGIOGRAPHY N/A 11/20/2017   Procedure: RIGHT/LEFT HEART CATH AND CORONARY ANGIOGRAPHY;  Surgeon: Jettie Booze, MD;  Location: [place] [facility];  Service: Cardiovascular;  Laterality: N/A;  . TEE WITHOUT CARDIOVERSION N/A 08/28/2017   Procedure: TRANSESOPHAGEAL ECHOCARDIOGRAM (TEE);  Surgeon: Fay Records, MD;  Location: [place];  Service: Cardiovascular;  Laterality: N/A;  . TEE WITHOUT CARDIOVERSION N/A 11/23/2017   Procedure: TRANSESOPHAGEAL ECHOCARDIOGRAM (TEE);  Surgeon: Gaye Pollack, MD;  Location: [place];  Service: Open Heart Surgery;  Laterality: N/A;  . TUBAL LIGATION      There were no vitals filed for this visit.  Subjective Assessment - 01/09/18 1539    Pertinent History  recent CABG x 2 11/23/17, Lt CVA 07/2017. PMH: CAD, HTN, MI    Limitations  sternal precautions including no lifting > 5 lbs, *loop recorder, fall risk, no driving    Patient Stated Goals  Get my arm working    Currently in Pain?  Yes    Pain Score  8     Pain Location  Knee    Pain Orientation  Right;Left    Pain Descriptors / Indicators  Aching    Pain Type  Acute pain  OT LONG TERM GOAL #5   Title  Pt to perform light housework (laundry, dishes) at modified independence  level    Time  4    Period  Weeks    Status  New            Plan - 01/10/18 1603    Clinical Impression Statement  Pt is progressing slowly towards goals.    Rehab Potential  Good    OT Frequency  2x / week    OT Duration  4 weeks    OT Treatment/Interventions  Self-care/ADL training;Moist Heat;Fluidtherapy;DME and/or AE instruction;Splinting;Aquatic Therapy;Therapeutic activities;Compression bandaging;Other (comment);Therapeutic exercise;Neuromuscular education;Functional Mobility Training;Passive range of motion;Visual/perceptual remediation/compensation;Manual Therapy;Electrical Stimulation;Energy conservation;Patient/family education    Plan   splint check, continue NMR while adhering to sternal precautions, show one handed cutting board and pot stabalizer, begin to address LTG #2    Consulted and Agree with Plan of Care  Patient;Family member/caregiver    Family Member Consulted  husband       Patient will benefit from skilled therapeutic intervention in order to improve the following deficits and impairments:  Decreased coordination, Decreased range of motion, Difficulty walking, Decreased endurance, Impaired tone, Decreased activity tolerance, Decreased knowledge of precautions, Decreased balance, Decreased knowledge of use of DME, Impaired UE functional use, Pain, Decreased mobility, Decreased strength  Visit Diagnosis: Hemiplegia and hemiparesis following cerebral infarction affecting right dominant side (HCC)  Muscle weakness (generalized)  Other lack of coordination    Problem List Patient Active Problem List   Diagnosis Date Noted  . Paroxysmal atrial fibrillation ([place]) 12/19/2017  . Chronic combined systolic and diastolic CHF (congestive heart failure) ([place]) 12/16/2017  . Debility 11/29/2017  . Benign essential HTN   . Acute blood loss anemia   . Tachycardia   . Hyperglycemia   . Supplemental oxygen dependent   . 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 Coronary artery  disease 11/23/2017  . Acute on chronic systolic heart failure ([place]) 11/21/2017  . Acute respiratory failure with hypoxia ([place]) 11/16/2017  . Aspiration pneumonia ([place]) 11/16/2017  . NSTEMI (non-ST elevated myocardial infarction) ([place]) 11/16/2017  . Stress and adjustment reaction 10/13/2017  . Urticaria 10/13/2017  . Former smoker 10/13/2017  . Cerebrovascular accident (CVA) due to occlusion of left middle cerebral artery ([place])   . Right hemiplegia ([place])   . Dysphagia, post-stroke   . Hyperlipidemia     Arth Nicastro 01/10/2018, 4:06 PM Theone Murdoch, OTR/L [phone] Phone: [phone] 4:09 PM 01/10/18 [facility] [address] [place], [place], [address] Phone: [phone]   Fax:  [phone]  Name: DECIE VERNE MRN: [id number] Date of Birth: 1961-09-26  OT LONG TERM GOAL #5   Title  Pt to perform light housework (laundry, dishes) at modified independence  level    Time  4    Period  Weeks    Status  New            Plan - 01/10/18 1603    Clinical Impression Statement  Pt is progressing slowly towards goals.    Rehab Potential  Good    OT Frequency  2x / week    OT Duration  4 weeks    OT Treatment/Interventions  Self-care/ADL training;Moist Heat;Fluidtherapy;DME and/or AE instruction;Splinting;Aquatic Therapy;Therapeutic activities;Compression bandaging;Other (comment);Therapeutic exercise;Neuromuscular education;Functional Mobility Training;Passive range of motion;Visual/perceptual remediation/compensation;Manual Therapy;Electrical Stimulation;Energy conservation;Patient/family education    Plan   splint check, continue NMR while adhering to sternal precautions, show one handed cutting board and pot stabalizer, begin to address LTG #2    Consulted and Agree with Plan of Care  Patient;Family member/caregiver    Family Member Consulted  husband       Patient will benefit from skilled therapeutic intervention in order to improve the following deficits and impairments:  Decreased coordination, Decreased range of motion, Difficulty walking, Decreased endurance, Impaired tone, Decreased activity tolerance, Decreased knowledge of precautions, Decreased balance, Decreased knowledge of use of DME, Impaired UE functional use, Pain, Decreased mobility, Decreased strength  Visit Diagnosis: Hemiplegia and hemiparesis following cerebral infarction affecting right dominant side (HCC)  Muscle weakness (generalized)  Other lack of coordination    Problem List Patient Active Problem List   Diagnosis Date Noted  . Paroxysmal atrial fibrillation ([place]) 12/19/2017  . Chronic combined systolic and diastolic CHF (congestive heart failure) ([place]) 12/16/2017  . Debility 11/29/2017  . Benign essential HTN   . Acute blood loss anemia   . Tachycardia   . Hyperglycemia   . Supplemental oxygen dependent   . 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 Coronary artery  disease 11/23/2017  . Acute on chronic systolic heart failure ([place]) 11/21/2017  . Acute respiratory failure with hypoxia ([place]) 11/16/2017  . Aspiration pneumonia ([place]) 11/16/2017  . NSTEMI (non-ST elevated myocardial infarction) ([place]) 11/16/2017  . Stress and adjustment reaction 10/13/2017  . Urticaria 10/13/2017  . Former smoker 10/13/2017  . Cerebrovascular accident (CVA) due to occlusion of left middle cerebral artery ([place])   . Right hemiplegia ([place])   . Dysphagia, post-stroke   . Hyperlipidemia     Arth Nicastro 01/10/2018, 4:06 PM Theone Murdoch, OTR/L [phone] Phone: [phone] 4:09 PM 01/10/18 [facility] [address] [place], [place], [address] Phone: [phone]   Fax:  [phone]  Name: DECIE VERNE MRN: [id number] Date of Birth: 1961-09-26

## 2018-01-16 ENCOUNTER — Telehealth: Payer: Self-pay | Admitting: Family Medicine

## 2018-01-16 DIAGNOSIS — M25562 Pain in left knee: Secondary | ICD-10-CM | POA: Diagnosis not present

## 2018-01-16 DIAGNOSIS — I6930 Unspecified sequelae of cerebral infarction: Secondary | ICD-10-CM | POA: Insufficient documentation

## 2018-01-16 DIAGNOSIS — M170 Bilateral primary osteoarthritis of knee: Secondary | ICD-10-CM | POA: Diagnosis not present

## 2018-01-16 DIAGNOSIS — M1711 Unilateral primary osteoarthritis, right knee: Secondary | ICD-10-CM | POA: Insufficient documentation

## 2018-01-16 DIAGNOSIS — M1712 Unilateral primary osteoarthritis, left knee: Secondary | ICD-10-CM | POA: Insufficient documentation

## 2018-01-16 DIAGNOSIS — M25561 Pain in right knee: Secondary | ICD-10-CM | POA: Diagnosis not present

## 2018-01-16 NOTE — Telephone Encounter (Signed)
Copied from [facility] [id number]. Topic: Quick Communication - Rx Refill/Question >> Jan 16, 2018  2:38 PM Carolyn Stare wrote: Medication Refills were sent in by D Raoul Pitch  in March and pt said she was not aware that they were sent in    carvedilol (COREG) 6.25 MG tablet    Furosemide (LASIX) 20 MG tablet    Preferred Pharmacy [facility]  [address]   Agent: Please be advised that RX refills may take up to 3 business days. We ask that you follow-up with your pharmacy.

## 2018-01-17 ENCOUNTER — Encounter: Payer: Self-pay | Admitting: Physical Therapy

## 2018-01-17 ENCOUNTER — Ambulatory Visit: Payer: BLUE CROSS/BLUE SHIELD | Admitting: Occupational Therapy

## 2018-01-17 ENCOUNTER — Ambulatory Visit: Payer: BLUE CROSS/BLUE SHIELD | Admitting: Physical Therapy

## 2018-01-17 ENCOUNTER — Telehealth: Payer: Self-pay | Admitting: Cardiology

## 2018-01-17 VITALS — BP 150/73

## 2018-01-17 DIAGNOSIS — R2689 Other abnormalities of gait and mobility: Secondary | ICD-10-CM | POA: Diagnosis not present

## 2018-01-17 DIAGNOSIS — R278 Other lack of coordination: Secondary | ICD-10-CM

## 2018-01-17 DIAGNOSIS — M6281 Muscle weakness (generalized): Secondary | ICD-10-CM

## 2018-01-17 DIAGNOSIS — R2681 Unsteadiness on feet: Secondary | ICD-10-CM

## 2018-01-17 DIAGNOSIS — I69351 Hemiplegia and hemiparesis following cerebral infarction affecting right dominant side: Secondary | ICD-10-CM

## 2018-01-17 DIAGNOSIS — R293 Abnormal posture: Secondary | ICD-10-CM | POA: Diagnosis not present

## 2018-01-17 DIAGNOSIS — R600 Localized edema: Secondary | ICD-10-CM | POA: Diagnosis not present

## 2018-01-17 NOTE — Therapy (Signed)
[facility] [address] [place], [place], [address] Phone: [phone]   Fax:  [phone]  Physical Therapy Treatment  Patient Details  Name: [name] MRN: [id number] Date of Birth: [birth year] Referring Provider: Delice Lesch MD   Encounter Date: 01/17/2018  PT End of Session - 01/17/18 1222    Visit Number  5    Number of Visits  8    Date for PT Re-Evaluation  12/20/17    Authorization Type  BCBS    Authorization Time Period  Unclear pt's current visit count for 2019 (or visit limit--?24 or 30). 3/27 left message for insurance specialist to assist with clarification; VL =30, used 14 this year with 16 remaining (to share between PT and OT)    Authorization - Visit Number  7 PT & OT combined    Authorization - Number of Visits  16 PT & OT combined    PT Start Time  1020    PT Stop Time  1100    PT Time Calculation (min)  40 min    Activity Tolerance  Patient limited by pain    Behavior During Therapy  [facility] for tasks assessed/performed       Past Medical History:  Diagnosis Date  . Arthritis    "knees" (11/16/2017)  . CAD (coronary artery disease)    a. s/p NSTEMI in 10/2017 with cath showing LM disease --> s/p CABG on 11/23/2017 with LIMA-LAD and SVG-OM.   [name] Hay fever   . Pneumonia 11/16/2017  . Stroke [facility]) 08/23/2017   Left MCA infarct status post TPA and thrombectomy    Past Surgical History:  Procedure Laterality Date  . CORONARY ARTERY BYPASS GRAFT N/A 11/23/2017   Procedure: CORONARY ARTERY BYPASS GRAFTING (CABG) x 2, ON PUMP, USING LEFT INTERNAL MAMMARY ARTERY AND RIGHT GREATER SAPHENOUS VEIN HARVESTED ENDOSCOPICALLY;  Surgeon: Gaye Pollack, MD;  Location: [place];  Service: Open Heart Surgery;  Laterality: N/A;  . CRYOABLATION     cervical  . IR ANGIO INTRA EXTRACRAN SEL COM CAROTID INNOMINATE UNI R MOD SED  08/21/2017  . IR ANGIO VERTEBRAL SEL SUBCLAVIAN INNOMINATE UNI R MOD SED  08/21/2017   . IR PERCUTANEOUS ART THROMBECTOMY/INFUSION INTRACRANIAL INC DIAG ANGIO  08/21/2017  . IR RADIOLOGIST EVAL & MGMT  10/30/2017  . LOOP RECORDER INSERTION N/A 08/28/2017   Procedure: LOOP RECORDER INSERTION;  Surgeon: Constance Haw, MD;  Location: [place] [facility];  Service: Cardiovascular;  Laterality: N/A;  . RADIOLOGY WITH ANESTHESIA N/A 08/21/2017   Procedure: RADIOLOGY WITH ANESTHESIA;  Surgeon: Luanne Bras, MD;  Location: [place];  Service: Radiology;  Laterality: N/A;  . RIGHT/LEFT HEART CATH AND CORONARY ANGIOGRAPHY N/A 11/20/2017   Procedure: RIGHT/LEFT HEART CATH AND CORONARY ANGIOGRAPHY;  Surgeon: Jettie Booze, MD;  Location: [place] [facility];  Service: Cardiovascular;  Laterality: N/A;  . TEE WITHOUT CARDIOVERSION N/A 08/28/2017   Procedure: TRANSESOPHAGEAL ECHOCARDIOGRAM (TEE);  Surgeon: Fay Records, MD;  Location: [facility];  Service: Cardiovascular;  Laterality: N/A;  . TEE WITHOUT CARDIOVERSION N/A 11/23/2017   Procedure: TRANSESOPHAGEAL ECHOCARDIOGRAM (TEE);  Surgeon: Gaye Pollack, MD;  Location: [place];  Service: Open Heart Surgery;  Laterality: N/A;  . TUBAL LIGATION      There were no vitals filed for this visit.      [facility] PT Assessment - 01/17/18 1030      Assessment   Medical Diagnosis  s/p CABG x2, pneumonia  Timed Up and Go Test   TUG  Normal TUG    Normal TUG (seconds)  1.11    TUG Comments  with quad cane; significant pain in R knee after injection; will wait until Monday to re-assess                   [facility] Adult PT Treatment/Exercise - 01/17/18 1054      Knee/Hip Exercises: Standing   Hip Flexion  Stengthening;AROM;Right;1 set;10 reps;Knee bent LUE support    Hip Abduction  AROM;Stengthening;Right;1 set;10 reps;Knee straight LUE support          Balance Exercises - 01/17/18 1051      Balance Exercises: Seated   Dynamic Sitting  Eyes opened;No upper extremity support;No lower extremity  support;Anterior/posterior weight shift;Lateral weight shift;Reaching outside base of support    Other Seated Exercises  Seated on balance disc performing anterior/posterior pelvic tilt, lateral pelvic tilts with and without LE support, no UE support; also performed reaching out of BOS to L for lateral weight shift to L with L trunk elongation, R trunk shortening.  Trunk and postural control training on disc while alternating R and LLE flexion to tap cones        PT Education - 01/17/18 1221    Education provided  Yes    Education Details  signs and symptoms of infection after cortisone injection, will wait until Monday to assess STG due to knee pain after injection; ice for knee pain and edema    Person(s) Educated  Patient    Methods  Explanation    Comprehension  Verbalized understanding       PT Short Term Goals - 01/10/18 1715      PT SHORT TERM GOAL #1   Title  verbalize understanding of sternal precautions and CVA risk factors/warning signs (Target all STGs 01/19/18)    Time  4    Period  Weeks    Status  New      PT SHORT TERM GOAL #2   Title  amb >= 350' with LRAD on indoor/paved outdoor surfaces modified independent for improved function    Time  4    Period  Weeks    Status  New      PT SHORT TERM GOAL #3   Title  improve timed up and go to < 25 sec with LRAD for improved mobility    Time  4    Period  Weeks    Status  New      PT SHORT TERM GOAL #4   Title  improve gait velocity to at least 1.0 ft/sec for improved mobility    Time  4    Period  Weeks    Status  New        PT Long Term Goals - 12/20/17 2122      PT LONG TERM GOAL #1   Title  independent with HEP (Target all LTGs 02/18/2018)    Time  8    Period  Weeks    Status  New    Target Date  02/18/18      PT LONG TERM GOAL #2   Title  improve BERG balance score to >/= 49/56 for improved balance and decreased fall risk    Time  8    Period  Weeks    Status  New      PT LONG TERM GOAL #3    Title  improve timed up and go to < 20  sec for improved mobility    Time  8    Period  Weeks    Status  New      PT LONG TERM GOAL #4   Title  improve gait velocity to > 1.8 ft/sec for decreased fall risk    Time  8    Period  Weeks    Status  New      PT LONG TERM GOAL #5   Title  amb > 500' on various indoor/outdoor surfaces modified independent with LRAD for 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 Unable to assess STG today due to pain; will assess on Monday if pain has resolved.  Pt educated on signs and symptoms of infection after injection.  Pt was able to participate in sitting balance, trunk and postural control training on unstable sitting surface and brief standing to strengthen RLE in open chain.  Will continue to progress towards LTG as pt is able to tolerate.    Rehab Potential  Good    PT Frequency  1x / week decrease from 2x/wk to 1x/wk    PT Duration  8 weeks may need to adjust based on available remaining visits    PT Treatment/Interventions  ADLs/Self Care Home Management;Electrical Stimulation;Therapeutic exercise;Therapeutic activities;Functional mobility training;Stair training;Gait training;DME Instruction;Neuromuscular re-education;Balance training;Patient/family education;Orthotic Fit/Training;Manual techniques;Vestibular;Taping;Aquatic Therapy;Passive range of motion    PT Next Visit Plan  continue sternal precautions; CHECK STGs on MONDAY - not able to perform on WED due to receiving cortisone injections in knees; review s/s of CVA and actions to take; add balance exercises to HEP at counter with light inermittent support ; gait training with quad cane on community type surfaces.(ramp, curb, sidewalk)    Consulted and  Agree with Plan of Care  Patient    Family Member Consulted  --       Patient will benefit from skilled therapeutic intervention in order to improve the following deficits and impairments:  Abnormal gait, Decreased strength, Decreased balance, Difficulty walking, Decreased mobility, Impaired perceived functional ability, Decreased activity tolerance, Decreased endurance, Decreased knowledge of use of DME, Increased edema, Impaired UE functional use  Visit Diagnosis: Muscle weakness (generalized)  Unsteadiness on feet  Abnormal posture  Other abnormalities of gait and mobility     Problem List Patient Active Problem List   Diagnosis Date Noted  . Paroxysmal atrial fibrillation ([place]) 12/19/2017  . Chronic combined systolic and diastolic CHF (congestive heart failure) ([place]) 12/16/2017  . Debility 11/29/2017  . Benign essential HTN   . Acute blood loss anemia   . Tachycardia   . Hyperglycemia   . Supplemental oxygen dependent   . 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 Coronary artery disease 11/23/2017  . Acute on chronic systolic heart failure ([place]) 11/21/2017  . Acute respiratory failure with hypoxia ([place]) 11/16/2017  . Aspiration pneumonia ([place]) 11/16/2017  . NSTEMI (non-ST elevated myocardial infarction) ([place]) 11/16/2017  . Stress and adjustment reaction 10/13/2017  . Urticaria 10/13/2017  . Former smoker 10/13/2017  . Cerebrovascular accident (CVA) due to occlusion of left middle cerebral artery ([place])   . Right hemiplegia ([place])   .  Dysphagia, post-stroke   . Hyperlipidemia     Rico Junker, PT, DPT 01/17/18    12:28 PM    [facility] [address] [place], [place], [address] Phone: [phone]   Fax:  [phone]  Name: [name] MRN: [id number] Date of Birth: Jan 19, 1961

## 2018-01-17 NOTE — Telephone Encounter (Signed)
Spoke w/ pt husband and requested that she send a manual transmission b/c her home monitor has not updated in at least 14 days.   

## 2018-01-17 NOTE — Therapy (Signed)
[facility] [address] [place], [place], [address] Phone: [phone]   Fax:  [phone]  Occupational Therapy Treatment  Patient Details  Name: [name] MRN: [id number] Date of Birth: 11-Apr-1961 Referring Provider: Maryla Morrow MD   Encounter Date: 01/17/2018  OT End of Session - 01/17/18 1012    Visit Number  5    Number of Visits  8    Date for OT Re-Evaluation  01/20/18 will likely extend end date as pt is only being seen 1x week    Authorization Type  BC/BS    Authorization - Visit Number  12    Authorization - Number of Visits  16    OT Start Time  [id number]    OT Stop Time  1015    OT Time Calculation (min)  50 min    Activity Tolerance  Patient tolerated treatment well       Past Medical History:  Diagnosis Date  . Arthritis    "knees" (11/16/2017)  . CAD (coronary artery disease)    a. s/p NSTEMI in 10/2017 with cath showing LM disease --> s/p CABG on 11/23/2017 with LIMA-LAD and SVG-OM.   [name] Hay fever   . Pneumonia 11/16/2017  . Stroke [facility]) 08/23/2017   Left MCA infarct status post TPA and thrombectomy    Past Surgical History:  Procedure Laterality Date  . CORONARY ARTERY BYPASS GRAFT N/A 11/23/2017   Procedure: CORONARY ARTERY BYPASS GRAFTING (CABG) x 2, ON PUMP, USING LEFT INTERNAL MAMMARY ARTERY AND RIGHT GREATER SAPHENOUS VEIN HARVESTED ENDOSCOPICALLY;  Surgeon: Alleen Borne, MD;  Location: [facility] OR;  Service: Open Heart Surgery;  Laterality: N/A;  . CRYOABLATION     cervical  . IR ANGIO INTRA EXTRACRAN SEL COM CAROTID INNOMINATE UNI R MOD SED  08/21/2017  . IR ANGIO VERTEBRAL SEL SUBCLAVIAN INNOMINATE UNI R MOD SED  08/21/2017  . IR PERCUTANEOUS ART THROMBECTOMY/INFUSION INTRACRANIAL INC DIAG ANGIO  08/21/2017  . IR RADIOLOGIST EVAL & MGMT  10/30/2017  . LOOP RECORDER INSERTION N/A 08/28/2017   Procedure: LOOP RECORDER INSERTION;  Surgeon: Regan Lemming, MD;  [facility]: [facility] INVASIVE CV  LAB;  Service: Cardiovascular;  Laterality: N/A;  . RADIOLOGY WITH ANESTHESIA N/A 08/21/2017   Procedure: RADIOLOGY WITH ANESTHESIA;  Surgeon: Julieanne Cotton, MD;  Location: [facility] OR;  Service: Radiology;  Laterality: N/A;  . RIGHT/LEFT HEART CATH AND CORONARY ANGIOGRAPHY N/A 11/20/2017   Procedure: RIGHT/LEFT HEART CATH AND CORONARY ANGIOGRAPHY;  Surgeon: Corky Crafts, MD;  Location: [facility] INVASIVE CV LAB;  Service: Cardiovascular;  Laterality: N/A;  . TEE WITHOUT CARDIOVERSION N/A 08/28/2017   Procedure: TRANSESOPHAGEAL ECHOCARDIOGRAM (TEE);  Surgeon: Pricilla Riffle, MD;  Location[facility] ENDOSCOPY;  Service: Cardiovascular;  Laterality: N/A;  . TEE WITHOUT CARDIOVERSION N/A 11/23/2017   Procedure: TRANSESOPHAGEAL ECHOCARDIOGRAM (TEE);  Surgeon: Alleen Borne, MD;  Location: [facility] OR;  Service: Open Heart Surgery;  Laterality: N/A;  . TUBAL LIGATION      Vitals:   01/17/18 0932  BP: (!) 150/73    Subjective Assessment - 01/17/18 0932    Subjective   reports cortizone injections in knees yesterday    Pertinent History  recent CABG x 2 11/23/17, Lt CVA 07/2017. PMH: CAD, HTN, MI    Limitations  sternal precautions including no lifting > 5 lbs, *loop recorder, fall risk, no driving    Currently in Pain?  Yes    Pain Score  8  8/10 for right, 5/10  for left    Pain Location  Knee    Pain Orientation  Right;Left    Pain Descriptors / Indicators  Aching    Pain Type  Acute pain    Pain Onset  Yesterday    Pain Frequency  Constant    Aggravating Factors   walking    Pain Relieving Factors  ice    Effect of Pain on Daily Activities  limits walking          Treatment:Pt arrived reporting significant knee pain and MD wants her to limit standing. Pt reports resting hand splint is fitting well following modifications and she slept in it all night. Pt was issued a d-ring wrist splint to wear during the daytime as she is unable to fully control wrist movement and is at risk for injury. Pt  verbalizes understanding of wear. Supine NMR while ice packs applied to bilateral knees x 15-20 mins no adverse reactions. Pt reports decreased pain following application. AA/ROM shoulder flexion, elbow flexion extension with mod facilitation for shoulder positioning. UE ranger for low range shoulder flexion, mod facilitation. Seated AA/ROM at table supination/ pronation and trace beginning finger/ wrist extension with mod facilitation.                 OT Education - 01/17/18 1048    Education provided  Yes    Education Details  d-ring wrist brace wear/ care application to protect wrist    Person(s) Educated  Patient    Methods  Explanation;Demonstration;Verbal cues;Handout    Comprehension  Verbalized understanding;Returned demonstration       OT Short Term Goals - 01/17/18 1030      OT SHORT TERM GOAL #1   Title  Independent with revised HEP     Time  4    Period  Weeks    Status  Revised      OT SHORT TERM GOAL #2   Title  Pt to demo 25 degrees shoulder flexion in prep for low level reaching    Time  4    Period  Weeks    Status  On-going      OT SHORT TERM GOAL #3   Title  Pt to demo 75% finger flexion and 10% finger extension in prep for functional grasp/release    Time  4    Period  Weeks    Status  Revised      OT SHORT TERM GOAL #4   Title  Pt/family to verbalize understanding with edema management strategies Rt hand and positioning for Rt shoulder to minimize pain    Time  4    Period  Weeks    Status  On-going      OT SHORT TERM GOAL #5   Title  Pt to verbalize understanding with A/E needs and task modifications to increased independence with ADLS/IADLS    Time  4    Period  Weeks    Status  On-going        OT Long Term Goals - 12/25/17 1223      OT LONG TERM GOAL #1   Title  Independent with updated HEP    Time  4    Period  Weeks    Status  New    Target Date  01/20/18      OT LONG TERM GOAL #2   Title  Pt to verbalize  understanding with edema management strategies Rt hand and positioning for Rt shoulder to minimize pain  Time  4    Period  Weeks    Status  New      OT LONG TERM GOAL #3   Title  Pt to verbalize understanding with A/E needs and task modifications to increase independence with ADLS/IADLS    Time  4    Period  Weeks    Status  New      OT LONG TERM GOAL #4   Title  Pt to make sandwich/snack/microwaveable meals consistently at mod I level using A/E prn    Time  4    Period  Weeks    Status  New      OT LONG TERM GOAL #5   Title  Pt to perform light housework (laundry, dishes) at modified independence level    Time  4    Period  Weeks    Status  New            Plan - 01/17/18 1031    Clinical Impression Statement  Pt is progressing slowly towards goals. today she presents with significant pain in her knees s/p cortisone injections in bilateral knees. Pt demonstrates trace wrist extension and finger extension.    Occupational Profile and client history currently impacting functional performance  Recent CABG x 2 11/23/17, CVA 07/2017, MI, CAD    Occupational performance deficits (Please refer to evaluation for details):  ADL's;IADL's;Social Participation;Other    Rehab Potential  Good    OT Frequency  -- currently seeing 1x week due to insurance    OT Duration  4 weeks    OT Treatment/Interventions  Self-care/ADL training;Moist Heat;Fluidtherapy;DME and/or AE instruction;Splinting;Aquatic Therapy;Therapeutic activities;Compression bandaging;Other (comment);Therapeutic exercise;Neuromuscular education;Functional Mobility Training;Passive range of motion;Visual/perceptual remediation/compensation;Manual Therapy;Electrical Stimulation;Energy conservation;Patient/family education    Plan  show one handed cutting board and pot stabilizer, check short term goals,     Consulted and Agree with Plan of Care  Patient;Family member/caregiver       Patient will benefit from skilled  therapeutic intervention in order to improve the following deficits and impairments:  Decreased coordination, Decreased range of motion, Difficulty walking, Decreased endurance, Impaired tone, Decreased activity tolerance, Decreased knowledge of precautions, Decreased balance, Decreased knowledge of use of DME, Impaired UE functional use, Pain, Decreased mobility, Decreased strength  Visit Diagnosis: Hemiplegia and hemiparesis following cerebral infarction affecting right dominant side (HCC)  Muscle weakness (generalized)  Other lack of coordination    Problem List Patient Active Problem List   Diagnosis Date Noted  . Paroxysmal atrial fibrillation (HCC) 12/19/2017  . Chronic combined systolic and diastolic CHF (congestive heart failure) (HCC) 12/16/2017  . Debility 11/29/2017  . Benign essential HTN   . Acute blood loss anemia   . Tachycardia   . Hyperglycemia   . Supplemental oxygen dependent   . 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 Coronary artery disease 11/23/2017  . Acute on chronic systolic heart failure (HCC) 11/21/2017  . Acute respiratory failure with hypoxia (HCC) 11/16/2017  . Aspiration pneumonia (HCC) 11/16/2017  . NSTEMI (non-ST elevated myocardial infarction) (HCC) 11/16/2017  . Stress and adjustment reaction 10/13/2017  . Urticaria 10/13/2017  . Former smoker 10/13/2017  . Cerebrovascular accident (CVA) due to occlusion of left middle cerebral artery (HCC)   . Right hemiplegia (HCC)   . Dysphagia, post-stroke   . Hyperlipidemia     Malene Blaydes 01/17/2018, 10:49 AM Keene Breath, OTR/L [phone] Phone: [phone] 10:49 AM 01/17/18 [facility] [address] [place], [place], [address] Phone:  [phone]   Fax:  [phone]  Name: [name] MRN: [id number] Date of Birth: 1961/05/01

## 2018-01-17 NOTE — Patient Instructions (Signed)
Wear your soft wrist brace when you are up moving around to protect your wrist. If any increased pain or problems remove brace.

## 2018-01-17 NOTE — Telephone Encounter (Signed)
[facility] pharmacy called and spoke to [employer] who says Carvedilol was last filled on 01/16/18 and she has no refills and Furosemide was filled today and she has no refills left.

## 2018-01-22 ENCOUNTER — Encounter: Payer: Self-pay | Admitting: Physical Therapy

## 2018-01-22 ENCOUNTER — Ambulatory Visit: Payer: BLUE CROSS/BLUE SHIELD | Admitting: Occupational Therapy

## 2018-01-22 ENCOUNTER — Ambulatory Visit: Payer: BLUE CROSS/BLUE SHIELD | Admitting: Physical Therapy

## 2018-01-22 DIAGNOSIS — I69351 Hemiplegia and hemiparesis following cerebral infarction affecting right dominant side: Secondary | ICD-10-CM | POA: Diagnosis not present

## 2018-01-22 DIAGNOSIS — R293 Abnormal posture: Secondary | ICD-10-CM

## 2018-01-22 DIAGNOSIS — R2689 Other abnormalities of gait and mobility: Secondary | ICD-10-CM | POA: Diagnosis not present

## 2018-01-22 DIAGNOSIS — R600 Localized edema: Secondary | ICD-10-CM

## 2018-01-22 DIAGNOSIS — R278 Other lack of coordination: Secondary | ICD-10-CM

## 2018-01-22 DIAGNOSIS — M6281 Muscle weakness (generalized): Secondary | ICD-10-CM

## 2018-01-22 DIAGNOSIS — R2681 Unsteadiness on feet: Secondary | ICD-10-CM

## 2018-01-22 NOTE — Addendum Note (Signed)
Addended by: Hans Eden on: 01/22/2018 11:52 AM   Modules accepted: Orders

## 2018-01-22 NOTE — Therapy (Signed)
[facility] [address] [place], [place], [address] Phone: [phone]   Fax:  [phone]  Physical Therapy Treatment  Patient Details  Name: [name] MRN: [id number] Date of Birth: September 21, 1961 Referring Provider: Delice Lesch MD   Encounter Date: 01/22/2018  PT End of Session - 01/22/18 1106    Visit Number  6    Number of Visits  8    Date for PT Re-Evaluation  02/18/18    Authorization Type  BCBS    Authorization Time Period  Unclear pt's current visit count for 2019 (or visit limit--?24 or 30). 3/27 left message for insurance specialist to assist with clarification; VL =30, used 14 this year with 16 remaining (to share between PT and OT)    Authorization - Visit Number  8 PT & OT combined    Authorization - Number of Visits  16 PT & OT combined    PT Start Time  1018    PT Stop Time  1105    PT Time Calculation (min)  47 min    Activity Tolerance  Patient tolerated treatment well    Behavior During Therapy  WFL for tasks assessed/performed       Past Medical History:  Diagnosis Date  . Arthritis    "knees" (11/16/2017)  . CAD (coronary artery disease)    a. s/p NSTEMI in 10/2017 with cath showing LM disease --> s/p CABG on 11/23/2017 with LIMA-LAD and SVG-OM.   [name] Hay fever   . Pneumonia 11/16/2017  . Stroke [facility]) 08/23/2017   Left MCA infarct status post TPA and thrombectomy    Past Surgical History:  Procedure Laterality Date  . CORONARY ARTERY BYPASS GRAFT N/A 11/23/2017   Procedure: CORONARY ARTERY BYPASS GRAFTING (CABG) x 2, ON PUMP, USING LEFT INTERNAL MAMMARY ARTERY AND RIGHT GREATER SAPHENOUS VEIN HARVESTED ENDOSCOPICALLY;  Surgeon: Gaye Pollack, MD;  Location: [facility];  Service: Open Heart Surgery;  Laterality: N/A;  . CRYOABLATION     cervical  . IR ANGIO INTRA EXTRACRAN SEL COM CAROTID INNOMINATE UNI R MOD SED  08/21/2017  . IR ANGIO VERTEBRAL SEL SUBCLAVIAN INNOMINATE UNI R MOD SED   08/21/2017  . IR PERCUTANEOUS ART THROMBECTOMY/INFUSION INTRACRANIAL INC DIAG ANGIO  08/21/2017  . IR RADIOLOGIST EVAL & MGMT  10/30/2017  . LOOP RECORDER INSERTION N/A 08/28/2017   Procedure: LOOP RECORDER INSERTION;  Surgeon: Constance Haw, MD;  Location: [facility] CV LAB;  Service: Cardiovascular;  Laterality: N/A;  . RADIOLOGY WITH ANESTHESIA N/A 08/21/2017   Procedure: RADIOLOGY WITH ANESTHESIA;  Surgeon: Luanne Bras, MD;  Location: [facility];  Service: Radiology;  Laterality: N/A;  . RIGHT/LEFT HEART CATH AND CORONARY ANGIOGRAPHY N/A 11/20/2017   Procedure: RIGHT/LEFT HEART CATH AND CORONARY ANGIOGRAPHY;  Surgeon: Jettie Booze, MD;  Location: [place] CV LAB;  Service: Cardiovascular;  Laterality: N/A;  . TEE WITHOUT CARDIOVERSION N/A 08/28/2017   Procedure: TRANSESOPHAGEAL ECHOCARDIOGRAM (TEE);  Surgeon: Fay Records, MD;  Location: [facility];  Service: Cardiovascular;  Laterality: N/A;  . TEE WITHOUT CARDIOVERSION N/A 11/23/2017   Procedure: TRANSESOPHAGEAL ECHOCARDIOGRAM (TEE);  Surgeon: Gaye Pollack, MD;  Location: [facility];  Service: Open Heart Surgery;  Laterality: N/A;  . TUBAL LIGATION      There were no vitals filed for this visit.  Subjective Assessment - 01/22/18 1020    Subjective  Knees are feeling a little better today; not using the w/c today.  Has not taken medication  today.    Patient is accompained by:  Family member    Pertinent History  ischemic cardiomyopathy with CHF and EF 35-40%;  lt CVA 11/18; aspiration pneumonia with hospitalization 10/2017; CABG 11/23/17; arthritis (esp knees)    Limitations  Lifting;Walking    Patient Stated Goals  walk with more coordination and without an assistive device    Currently in Pain?  Yes    Pain Score  5     Pain Location  Knee    Pain Orientation  Left    Pain Descriptors / Indicators  Aching    Pain Type  Chronic pain         [facility] PT Assessment - 01/22/18 1038      Ambulation/Gait   Stairs  Yes     Stairs Assistance  4: Min guard    Stair Management Technique  One rail Left;Alternating pattern;Backwards;Forwards    Number of Stairs  8    Height of Stairs  6    Ramp  5: Supervision    Ramp Details (indicate cue type and reason)  with quad cane x 3 reps    Curb  5: Supervision;4: Min assist    Curb Details (indicate cue type and reason)  with quad cane, mild pain, x 4 reps      Standardized Balance Assessment   Standardized Balance Assessment  10 meter walk test    10 Meter Walk  27.8 seconds or 1.17 ft/sec      Timed Up and Go Test   TUG  Normal TUG    Normal TUG (seconds)  24.3    TUG Comments  with quad cane; decreased knee pain                   [facility] Adult PT Treatment/Exercise - 01/22/18 1025      Therapeutic Activites    Therapeutic Activities  Other Therapeutic Activities    Other Therapeutic Activities  reviewed sternal precautions and risk for CVA and signs of recurrent CVA, safe suture removal, gentle scar massage             PT Education - 01/22/18 1106    Education provided  Yes    Education Details  progress towards goals, see TA     Person(s) Educated  Patient;Spouse    Methods  Explanation    Comprehension  Verbalized understanding       PT Short Term Goals - 01/22/18 1045      PT SHORT TERM GOAL #1   Title  verbalize understanding of sternal precautions and CVA risk factors/warning signs (Target all STGs 01/19/18)    Time  4    Period  Weeks    Status  Not Met      PT SHORT TERM GOAL #2   Title  amb >= 350' with LRAD on indoor/paved outdoor surfaces modified independent for improved function    Baseline  MOD I indoors, supervision with outdoor challenges    Time  4    Period  Weeks    Status  Partially Met      PT SHORT TERM GOAL #3   Title  improve timed up and go to < 25 sec with LRAD for improved mobility    Baseline  24 seconds with quad cane    Time  4    Period  Weeks    Status  Achieved      PT SHORT TERM GOAL  #4   Title  improve  gait velocity to at least 1.0 ft/sec for improved mobility    Baseline  1.17 ft/sec with quad cane     Time  4    Period  Weeks    Status  Achieved        PT Long Term Goals - 01/22/18 1108      PT LONG TERM GOAL #1   Title  independent with HEP (Target all LTGs 02/18/2018)    Time  8    Period  Weeks    Status  New    Target Date  02/18/18      PT LONG TERM GOAL #2   Title  improve BERG balance score to >/= 49/56 for improved balance and decreased fall risk    Time  8    Period  Weeks    Status  New    Target Date  02/18/18      PT LONG TERM GOAL #3   Title  improve timed up and go to < 20 sec for improved mobility    Baseline  24 at 4 weeks    Time  8    Period  Weeks    Status  On-going    Target Date  02/18/18      PT LONG TERM GOAL #4   Title  improve gait velocity to > 1.8 ft/sec for decreased fall risk    Baseline  1.17 with quad cane    Time  8    Period  Weeks    Status  On-going    Target Date  02/18/18      PT LONG TERM GOAL #5   Title  amb > 500' on various indoor/outdoor surfaces modified independent with LRAD for improved 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  Pt has met gait velocity and TUG goals.  Pt partially met ambulation goal for indoor/outdoor gait.  Pt is mod I for indoor gait with quad cane but requires supervision for various outdoor tasks with quad cane.  Pt did not meet STG #1 and required ongoing education regarding risk factors and signs of recurrent CVA; sternal precautions.  Will continue to progress towards LTG but may need to decrease frequency to 1x/week due to limited visits.    Rehab Potential  Good    PT Frequency  1x / week decrease from 2x/wk to 1x/wk     PT Duration  8 weeks may need to adjust based on available remaining visits    PT Treatment/Interventions  ADLs/Self Care Home Management;Electrical Stimulation;Therapeutic exercise;Therapeutic activities;Functional mobility training;Stair training;Gait training;DME Instruction;Neuromuscular re-education;Balance training;Patient/family education;Orthotic Fit/Training;Manual techniques;Vestibular;Taping;Aquatic Therapy;Passive range of motion    PT Next Visit Plan  add balance exercises to HEP at counter with light inermittent support ; gait training with quad cane on community type surfaces.(ramp, curb, sidewalk)    Consulted and Agree with Plan of Care  Patient;Family member/caregiver    Family Member Consulted  husband       Patient will benefit from skilled therapeutic intervention in order to improve the following deficits and impairments:  Abnormal gait, Decreased strength, Decreased balance,  Difficulty walking, Decreased mobility, Impaired perceived functional ability, Decreased activity tolerance, Decreased endurance, Decreased knowledge of use of DME, Increased edema, Impaired UE functional use  Visit Diagnosis: Muscle weakness (generalized)  Abnormal posture  Unsteadiness on feet  Other abnormalities of gait and mobility     Problem List Patient Active Problem List   Diagnosis Date Noted  . Paroxysmal atrial fibrillation ([place]) 12/19/2017  . Chronic combined systolic and diastolic CHF (congestive heart failure) ([place]) 12/16/2017  . Debility 11/29/2017  . Benign essential HTN   . Acute blood loss anemia   . Tachycardia   . Hyperglycemia   . Supplemental oxygen dependent   . 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 Coronary artery disease 11/23/2017  . Acute on chronic systolic heart failure ([place]) 11/21/2017  . Acute respiratory failure with hypoxia ([place]) 11/16/2017  . Aspiration pneumonia ([place]) 11/16/2017  . NSTEMI (non-ST elevated myocardial infarction) ([place]) 11/16/2017  . Stress  and adjustment reaction 10/13/2017  . Urticaria 10/13/2017  . Former smoker 10/13/2017  . Cerebrovascular accident (CVA) due to occlusion of left middle cerebral artery ([place])   . Right hemiplegia ([place])   . Dysphagia, post-stroke   . Hyperlipidemia    Rico Junker, PT, DPT 01/22/18    11:14 AM    [facility] [address] [place], [place], [address] Phone: [phone]   Fax:  [phone]  Name: SONNY POTH MRN: [id number] Date of Birth: 09/21/1961

## 2018-01-22 NOTE — Patient Instructions (Addendum)
Stroke Prevention Some health problems and behaviors may make it more likely for you to have a stroke. Below are ways to lessen your risk of having a stroke.  Be active for at least 30 minutes on most or all days.  Do not smoke. Try not to be around others who smoke.  Do not drink too much alcohol. ? Do not have more than 2 drinks a day if you are a man. ? Do not have more than 1 drink a day if you are a woman and are not pregnant.  Eat healthy foods, such as fruits and vegetables. If you were put on a specific diet, follow the diet as told.  Keep your cholesterol levels under control through diet and medicines. Look for foods that are low in saturated fat, trans fat, cholesterol, and are high in fiber.  If you have diabetes, follow all diet plans and take your medicine as told.  Ask your doctor if you need treatment to lower your blood pressure. If you have high blood pressure (hypertension), follow all diet plans and take your medicine as told by your doctor.  If you are 18-39 years old, have your blood pressure checked every 3-5 years. If you are age 40 or older, have your blood pressure checked every year.  Keep a healthy weight. Eat foods that are low in calories, salt, saturated fat, trans fat, and cholesterol.  Do not take drugs.  Avoid birth control pills, if this applies. Talk to your doctor about the risks of taking birth control pills.  Talk to your doctor if you have sleep problems (sleep apnea).  Take all medicine as told by your doctor. ? You may be told to take aspirin or blood thinner medicine. Take this medicine as told by your doctor. ? Understand your medicine instructions.  Make sure any other conditions you have are being taken care of.  Get help right away if:  You suddenly lose feeling (you feel numb) or have weakness in your face, arm, or leg.  Your face or eyelid hangs down to one side.  You suddenly feel confused.  You have trouble talking  (aphasia) or understanding what people are saying.  You suddenly have trouble seeing in one or both eyes.  You suddenly have trouble walking.  You are dizzy.  You lose your balance or your movements are clumsy (uncoordinated).  You suddenly have a very bad headache and you do not know the cause.  You have new chest pain.  Your heart feels like it is fluttering or skipping a beat (irregular heartbeat). Do not wait to see if the symptoms above go away. Get help right away. Call your local emergency services (911 in [place] Do not drive yourself to the hospital. This information is not intended to replace advice given to you by your health care provider. Make sure you discuss any questions you have with your health care provider. Document Released: 03/13/2012 Document Revised: 02/18/2016 Document Reviewed: 03/15/2013 Elsevier Interactive Patient Education  2018 Else[employer] Inc[employer]  Warning Signs of a Stroke A stroke is a medical emergency and should be treated right away-every second counts. A stroke is caused by a decrease or block in blood flow to the brain. When this occurs, certain areas of the brain do not get enough oxygen, and brain cells begin to die. A stroke can lead to brain damage and can sometimes be life-threatening. However, if someone having a stroke gets medical treatment right away, he or she has   better chances of surviving and recovering from the stroke. Being able to recognize the symptoms of a stroke is very important. Types of strokes There are two main types of strokes:  Ischemic strokes. This is the most common type of stroke. These strokes happen when a blood vessel that supplies blood to the brain is being blocked.  Hemorrhagic strokes. These strokes result from bleeding in the brain due to a blood vessel leaking or bursting (rupturing).  A transient ischemic attack (TIA) is a "warning stroke" that causes stroke-like symptoms that go away quickly. Unlike a stroke, a TIA  does not cause permanent damage to the brain. However, the symptoms of a TIA are the same as a stroke, and they also require medical treatment right away. Having a TIA is a sign that you are at higher risk for a permanent stroke. Warning signs of a stroke The symptoms of stroke may vary and will reflect the part of the brain that is involved. Symptoms usually happen suddenly. "BE FAST" is an easy way to remember the main warning signs of a stroke. B - Balance Signs are dizziness, sudden trouble walking, or loss of balance. E - Eyes Signs are trouble seeing or a sudden change in vision. F - Face Signs are sudden weakness or numbness of the face, or the face or eyelid drooping on one side. A - Arms Signs are weakness or numbness in an arm. This happens suddenly and usually on one side of the body. S - Speech Signs are sudden trouble speaking, slurred speech, or trouble understanding what people say. T - Time Time to call emergency services. Write down what time symptoms started. Other signs of a stroke Some less common signs of a stroke include:  A sudden, severe headache with no known cause.  Nausea or vomiting.  Seizure.  A stroke may be happening even if only one "BE FAST" symptoms is present. These symptoms may represent a serious problem that is an emergency. Do not wait to see if the symptoms will go away. Get medical help right away. Call your local emergency services (911 in the [place] Do not drive yourself to the hospital. Summary  A stroke is a medical emergency and should be treated right away-every second counts.  "BE FAST" is an easy way to remember the main warning signs of a stroke.  Call local emergency services right away if you or someone else has any stroke symptoms, even if the symptoms go away.  Make note of what time the first symptoms appeared. Emergency responders or emergency room staff will need to know this information.  Do not wait to see if symptoms will  go away. Call 911 even if only one of the "BE FAST" symptoms appears. This information is not intended to replace advice given to you by your health care provider. Make sure you discuss any questions you have with your health care provider. Document Released: 12/30/2016 Document Revised: 12/30/2016 Document Reviewed: 12/30/2016 Elsevier Interactive Patient Education  2018 Elsevier Inc.  

## 2018-01-22 NOTE — Therapy (Signed)
[facility] [address] [place], [place], [address] Phone: [phone]   Fax:  [phone]  Occupational Therapy Treatment  Patient Details  Name: [name] MRN: [id number] Date of Birth: 1961/04/20 Referring Provider: Delice Lesch MD   Encounter Date: 01/22/2018  OT End of Session - 01/22/18 1206    Visit Number  5    Number of Visits  8    Date for OT Re-Evaluation  02/13/18    Authorization Type  BC/BS    Authorization - Visit Number  13    Authorization - Number of Visits  16    OT Start Time  0932    OT Stop Time  1020    OT Time Calculation (min)  48 min    Activity Tolerance  Patient tolerated treatment well       Past Medical History:  Diagnosis Date  . Arthritis    "knees" (11/16/2017)  . CAD (coronary artery disease)    a. s/p NSTEMI in 10/2017 with cath showing LM disease --> s/p CABG on 11/23/2017 with LIMA-LAD and SVG-OM.   [name] Hay fever   . Pneumonia 11/16/2017  . Stroke [facility]) 08/23/2017   Left MCA infarct status post TPA and thrombectomy    Past Surgical History:  Procedure Laterality Date  . CORONARY ARTERY BYPASS GRAFT N/A 11/23/2017   Procedure: CORONARY ARTERY BYPASS GRAFTING (CABG) x 2, ON PUMP, USING LEFT INTERNAL MAMMARY ARTERY AND RIGHT GREATER SAPHENOUS VEIN HARVESTED ENDOSCOPICALLY;  Surgeon: Gaye Pollack, MD;  Location: [facility];  Service: Open Heart Surgery;  Laterality: N/A;  . CRYOABLATION     cervical  . IR ANGIO INTRA EXTRACRAN SEL COM CAROTID INNOMINATE UNI R MOD SED  08/21/2017  . IR ANGIO VERTEBRAL SEL SUBCLAVIAN INNOMINATE UNI R MOD SED  08/21/2017  . IR PERCUTANEOUS ART THROMBECTOMY/INFUSION INTRACRANIAL INC DIAG ANGIO  08/21/2017  . IR RADIOLOGIST EVAL & MGMT  10/30/2017  . LOOP RECORDER INSERTION N/A 08/28/2017   Procedure: LOOP RECORDER INSERTION;  Surgeon: Constance Haw, MD;  Location: [facility];  Service: Cardiovascular;  Laterality: N/A;  .  RADIOLOGY WITH ANESTHESIA N/A 08/21/2017   Procedure: RADIOLOGY WITH ANESTHESIA;  Surgeon: Luanne Bras, MD;  Location: [facility];  Service: Radiology;  Laterality: N/A;  . RIGHT/LEFT HEART CATH AND CORONARY ANGIOGRAPHY N/A 11/20/2017   Procedure: RIGHT/LEFT HEART CATH AND CORONARY ANGIOGRAPHY;  Surgeon: Jettie Booze, MD;  Location: [facility];  Service: Cardiovascular;  Laterality: N/A;  . TEE WITHOUT CARDIOVERSION N/A 08/28/2017   Procedure: TRANSESOPHAGEAL ECHOCARDIOGRAM (TEE);  Surgeon: Fay Records, MD;  Location: [facility];  Service: Cardiovascular;  Laterality: N/A;  . TEE WITHOUT CARDIOVERSION N/A 11/23/2017   Procedure: TRANSESOPHAGEAL ECHOCARDIOGRAM (TEE);  Surgeon: Gaye Pollack, MD;  Location: [facility];  Service: Open Heart Surgery;  Laterality: N/A;  . TUBAL LIGATION      There were no vitals filed for this visit.  Subjective Assessment - 01/22/18 1033    Subjective   I want to be able to write more than anything. My arm feels like it's getting weaker    Pertinent History  recent CABG x 2 11/23/17, Lt CVA 07/2017. PMH: CAD, HTN, MI    Limitations  sternal precautions including no lifting > 5 lbs, *loop recorder, fall risk, no driving    Patient Stated Goals  Get my arm working    Currently in Pain?  No/denies  OT Treatments/Exercises (OP) - 01/22/18 0001      ADLs   Writing  Pt expressed interest in writing therefore pt was shown adaptive devices that would provide wrist and hand support for RUE however pt would still need some shoulder, elbow, and forearm motion to utilize and/or some assist from LT hand. (Pt was shown Bird and adaptive pen holder with wrist and palmer support)    ADL Comments  Pt was shown one handed A/E including one handed cutting board, rocker knife, pot stabalizer, jar opener and provided handouts and told how/where to purchase if interested. Therapist also discussed other options for vegetables including hand  chopper, food processor, or buying veggies already chopped, steamable veggies, etc. Discussed frequency/duration at 1x/wk for 3 more visits               OT Short Term Goals - 01/22/18 1212      OT [place] #1   Title  No STG's at this time d/t limited visits - focus on LTG's (refer to adjusted renewal on 12/25/17)        OT Long Term Goals - 01/22/18 1209      OT LONG TERM GOAL #1   Title  Independent with updated HEP    Time  4    Period  Weeks    Status  On-going      OT LONG TERM GOAL #2   Title  Pt to verbalize understanding with edema management strategies Rt hand and positioning for Rt shoulder to minimize pain    Time  4    Period  Weeks    Status  New      OT LONG TERM GOAL #3   Title  Pt to verbalize understanding with A/E needs and task modifications to increase independence with ADLS/IADLS    Time  4    Period  Weeks    Status  Achieved      OT LONG TERM GOAL #4   Title  Pt to make sandwich/snack/microwaveable meals consistently at mod I level using A/E prn    Time  4    Period  Weeks    Status  On-going      OT LONG TERM GOAL #5   Title  Pt to perform light housework (laundry, dishes) at modified independence level    Time  4    Period  Weeks    Status  On-going            Plan - 01/22/18 1213    Clinical Impression Statement  Pt approximating LTG's.     Occupational Profile and client history currently impacting functional performance  Recent CABG x 2 11/23/17, CVA 07/2017, MI, CAD    Occupational performance deficits (Please refer to evaluation for details):  ADL's;IADL's;Social Participation;Other    Rehab Potential  Good    OT Frequency  1x / week    OT Duration  8 weeks    OT Treatment/Interventions  Self-care/ADL training;Moist Heat;Fluidtherapy;DME and/or AE instruction;Splinting;Aquatic Therapy;Therapeutic activities;Compression bandaging;Other (comment);Therapeutic exercise;Neuromuscular education;Functional Mobility  Training;Passive range of motion;Visual/perceptual remediation/compensation;Manual Therapy;Electrical Stimulation;Energy conservation;Patient/family education    Plan  continue progress towards LTG's    Consulted and Agree with Plan of Care  Patient;Family member/caregiver    Family Member Consulted  husband       Patient will benefit from skilled therapeutic intervention in order to improve the following deficits and impairments:  Decreased coordination, Decreased range of motion, Difficulty walking, Decreased endurance, Impaired tone, Decreased  activity tolerance, Decreased knowledge of precautions, Decreased balance, Decreased knowledge of use of DME, Impaired UE functional use, Pain, Decreased mobility, Decreased strength  Visit Diagnosis: Hemiplegia and hemiparesis following cerebral infarction affecting right dominant side (HCC)  Unsteadiness on feet  Muscle weakness (generalized)  Other lack of coordination  Localized edema  Abnormal posture    Problem List Patient Active Problem List   Diagnosis Date Noted  . Paroxysmal atrial fibrillation ([facility]) 12/19/2017  . Chronic combined systolic and diastolic CHF (congestive heart failure) ([facility]) 12/16/2017  . Debility 11/29/2017  . Benign essential HTN   . Acute blood loss anemia   . Tachycardia   . Hyperglycemia   . Supplemental oxygen dependent   . 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 Coronary artery disease 11/23/2017  . Acute on chronic systolic heart failure ([facility] [facility]) 11/21/2017  . Acute respiratory failure with hypoxia ([facility]) 11/16/2017  . Aspiration pneumonia ([facility]) 11/16/2017  . NSTEMI (non-ST elevated myocardial infarction) ([facility]) 11/16/2017  . Stress and adjustment reaction 10/13/2017  . Urticaria 10/13/2017  . Former smoker 10/13/2017  . Cerebrovascular accident (CVA) due to occlusion of left middle cerebral artery ([place])   . Right hemiplegia ([facility])   . Dysphagia, post-stroke   . Hyperlipidemia     Carey Bullocks,  OTR/L 01/22/2018, 12:14 PM  [facility] [address] [place], [place], [address] Phone: [phone]   Fax:  [phone]  Name: HEILEY SHAIKH MRN: [id number] Date of Birth: 20-Jun-1961

## 2018-01-24 DIAGNOSIS — I639 Cerebral infarction, unspecified: Secondary | ICD-10-CM | POA: Diagnosis not present

## 2018-01-25 ENCOUNTER — Ambulatory Visit: Payer: BLUE CROSS/BLUE SHIELD | Admitting: Physical Therapy

## 2018-01-25 ENCOUNTER — Encounter: Payer: BLUE CROSS/BLUE SHIELD | Admitting: Occupational Therapy

## 2018-01-25 ENCOUNTER — Ambulatory Visit: Payer: BLUE CROSS/BLUE SHIELD | Admitting: Occupational Therapy

## 2018-01-30 ENCOUNTER — Ambulatory Visit: Payer: BLUE CROSS/BLUE SHIELD | Admitting: Occupational Therapy

## 2018-01-30 ENCOUNTER — Encounter: Payer: Self-pay | Admitting: Physical Therapy

## 2018-01-30 ENCOUNTER — Ambulatory Visit: Payer: BLUE CROSS/BLUE SHIELD | Attending: Family Medicine | Admitting: Physical Therapy

## 2018-01-30 ENCOUNTER — Telehealth: Payer: Self-pay | Admitting: Physical Therapy

## 2018-01-30 DIAGNOSIS — M6281 Muscle weakness (generalized): Secondary | ICD-10-CM

## 2018-01-30 DIAGNOSIS — R293 Abnormal posture: Secondary | ICD-10-CM | POA: Diagnosis not present

## 2018-01-30 DIAGNOSIS — R600 Localized edema: Secondary | ICD-10-CM | POA: Diagnosis not present

## 2018-01-30 DIAGNOSIS — R2689 Other abnormalities of gait and mobility: Secondary | ICD-10-CM | POA: Insufficient documentation

## 2018-01-30 DIAGNOSIS — R2681 Unsteadiness on feet: Secondary | ICD-10-CM | POA: Diagnosis not present

## 2018-01-30 DIAGNOSIS — I69351 Hemiplegia and hemiparesis following cerebral infarction affecting right dominant side: Secondary | ICD-10-CM | POA: Diagnosis not present

## 2018-01-30 DIAGNOSIS — R278 Other lack of coordination: Secondary | ICD-10-CM | POA: Diagnosis not present

## 2018-01-30 NOTE — Telephone Encounter (Signed)
Dr. Raoul Pitch,  I have been working off and on with Ms. Guyett for Outpatient Physical Therapy since 12/20/17. Today she reported she has been losing weight (now down to 110 lbs per pt/husband) despite trying to gain weight. She reports her level of fatigue from basic activities has continued to worsen.   She indeed did look worse to me today (thinner, gaunt, more easily fatigued) and so I encouraged her to discuss with you.   Please feel free to contact me if you have questions or need further information.   Thank you,   Barry Brunner, PT Outpatient Neurorehabilitation [address] [place], [place] [address] [phone]

## 2018-01-30 NOTE — Therapy (Signed)
[facility] [address] [place], [place], [address] Phone: [phone]   Fax:  [phone]  Physical Therapy Treatment  Patient Details  Name: [name] MRN: [id number] Date of Birth: [birth year] Referring Provider: Delice Lesch MD   Encounter Date: 01/30/2018  PT End of Session - 01/30/18 1644    Visit Number  7    Number of Visits  8    Date for PT Re-Evaluation  02/18/18    Authorization Type  BCBS    Authorization Time Period  12/20/17 left message for insurance specialist to assist with clarification; VL =30, used 14 this year with 16 remaining (to share between PT and OT)    Authorization - Visit Number  14 PT & OT combined    Authorization - Number of Visits  16 PT & OT combined    PT Start Time  1102    PT Stop Time  1145    PT Time Calculation (min)  43 min    Activity Tolerance  Patient tolerated treatment well    Behavior During Therapy  [facility] for tasks assessed/performed       Past Medical History:  Diagnosis Date  . Arthritis    "knees" (11/16/2017)  . CAD (coronary artery disease)    a. s/p NSTEMI in 10/2017 with cath showing LM disease --> s/p CABG on 11/23/2017 with LIMA-LAD and SVG-OM.   [name] Hay fever   . Pneumonia 11/16/2017  . Stroke [facility]) 08/23/2017   Left MCA infarct status post TPA and thrombectomy    Past Surgical History:  Procedure Laterality Date  . CORONARY ARTERY BYPASS GRAFT N/A 11/23/2017   Procedure: CORONARY ARTERY BYPASS GRAFTING (CABG) x 2, ON PUMP, USING LEFT INTERNAL MAMMARY ARTERY AND RIGHT GREATER SAPHENOUS VEIN HARVESTED ENDOSCOPICALLY;  Surgeon: Gaye Pollack, MD;  Location: [facility];  Service: Open Heart Surgery;  Laterality: N/A;  . CRYOABLATION     cervical  . IR ANGIO INTRA EXTRACRAN SEL COM CAROTID INNOMINATE UNI R MOD SED  08/21/2017  . IR ANGIO VERTEBRAL SEL SUBCLAVIAN INNOMINATE UNI R MOD SED  08/21/2017  . IR PERCUTANEOUS ART THROMBECTOMY/INFUSION INTRACRANIAL  INC DIAG ANGIO  08/21/2017  . IR RADIOLOGIST EVAL & MGMT  10/30/2017  . LOOP RECORDER INSERTION N/A 08/28/2017   Procedure: LOOP RECORDER INSERTION;  Surgeon: Constance Haw, MD;  Location: [place] CV [facility];  Service: Cardiovascular;  Laterality: N/A;  . RADIOLOGY WITH ANESTHESIA N/A 08/21/2017   Procedure: RADIOLOGY WITH ANESTHESIA;  Surgeon: Luanne Bras, MD;  Location: [place] [facility];  Service: Radiology;  Laterality: N/A;  . RIGHT/LEFT HEART CATH AND CORONARY ANGIOGRAPHY N/A 11/20/2017   Procedure: RIGHT/LEFT HEART CATH AND CORONARY ANGIOGRAPHY;  Surgeon: Jettie Booze, MD;  Location: [place] CV [facility];  Service: Cardiovascular;  Laterality: N/A;  . TEE WITHOUT CARDIOVERSION N/A 08/28/2017   Procedure: TRANSESOPHAGEAL ECHOCARDIOGRAM (TEE);  Surgeon: Fay Records, MD;  Location: [facility];  Service: Cardiovascular;  Laterality: N/A;  . TEE WITHOUT CARDIOVERSION N/A 11/23/2017   Procedure: TRANSESOPHAGEAL ECHOCARDIOGRAM (TEE);  Surgeon: Gaye Pollack, MD;  Location: [facility];  Service: Open Heart Surgery;  Laterality: N/A;  . TUBAL LIGATION      There were no vitals filed for this visit.  Subjective Assessment - 01/30/18 1107    Subjective  Knees are feeling bad. Lt is worse. Frustrated and flat affect due to continues to lose weight while she and husband report she has been eating to GAIN weight.  They report she is eating well and yet continues to lose weight. Feels she has lost ground since initiating OPPT after CABG. Reports down to 110 lbs (per MD office note 3/25 she weighed 122 lbs). Concerned re: constant fatigue and tolerates less activity now than when she began OPPT.     Patient is accompained by:  Family member    Pertinent History  ischemic cardiomyopathy with CHF and EF 35-40%;  lt CVA 11/18; aspiration pneumonia with hospitalization 10/2017; CABG 11/23/17; arthritis (esp knees)    Limitations  Lifting;Walking    Patient Stated Goals  walk with more coordination and  without an assistive device    Currently in Pain?  Yes    Pain Score  8     Pain Location  Knee    Pain Orientation  Left    Pain Descriptors / Indicators  Aching    Pain Type  Chronic pain;Acute pain acute on chronic (since used RW as trial at PT)    Pain Onset  More than a month ago    Pain Frequency  Constant    Aggravating Factors   walking    Pain Relieving Factors  rest, ice    Effect of Pain on Daily Activities  limits her activity                       [facility] Adult PT Treatment/Exercise - 01/30/18 1605      Transfers   Transfers  Sit to Stand;Stand to Sit    Sit to Stand  6: Modified independent (Device/Increase time);Without upper extremity assist    Stand to Sit  6: Modified independent (Device/Increase time);Without upper extremity assist      Ambulation/Gait   Ambulation/Gait  Yes    Ambulation/Gait Assistance  4: Min assist;4: Min guard    Ambulation/Gait Assistance Details  pt with strong lean onto LUE on cane; assisted with weight shift over RLE to allow less weight on LUE and improved left step length; pt reports when fatigued her rt foot starts to supinate as she tries to advance it (not observed during session)   Ambulation Distance (Feet)  120 Feet 20, 60    Assistive device  Small based quad cane    Gait Pattern  Step-to pattern;Step-through pattern;Decreased stride length;Decreased weight shift to right;Right foot flat;Lateral trunk lean to left;Poor foot clearance - right    Ambulation Surface  Indoor      Self-Care   Other Self-Care Comments   Husband and pt very concerned re: her decline in weight despite her attempts to gain weight. She reports she has been more and more fatigued and is tolerating less activity at home than she was when she began OPPT. Educated patient that she may have lost strength and have decr energy related to episode of incr knee pain requiring knee injections, however this would not explain her weight loss and needs to  contact her PCP. Patient also reported poor sleep due to continues to sleep in the recliner post-CABG because it hurts too much to lie on her side.              PT Education - 01/30/18 1643    Education Details  see pt instructions    Person(s) Educated  Patient;Spouse    Methods  Explanation;Handout    Comprehension  Verbalized understanding       PT Short Term Goals - 01/22/18 1045      PT SHORT TERM GOAL #1  Title  verbalize understanding of sternal precautions and CVA risk factors/warning signs (Target all STGs 01/19/18)    Time  4    Period  Weeks    Status  Not Met      PT SHORT TERM GOAL #2   Title  amb >= 350' with LRAD on indoor/paved outdoor surfaces modified independent for improved function    Baseline  MOD I indoors, supervision with outdoor challenges    Time  4    Period  Weeks    Status  Partially Met      PT SHORT TERM GOAL #3   Title  improve timed up and go to < 25 sec with LRAD for improved mobility    Baseline  24 seconds with quad cane    Time  4    Period  Weeks    Status  Achieved      PT SHORT TERM GOAL #4   Title  improve gait velocity to at least 1.0 ft/sec for improved mobility    Baseline  1.17 ft/sec with quad cane     Time  4    Period  Weeks    Status  Achieved        PT Long Term Goals - 01/22/18 1108      PT LONG TERM GOAL #1   Title  independent with HEP (Target all LTGs 02/18/2018)    Time  8    Period  Weeks    Status  New    Target Date  02/18/18      PT LONG TERM GOAL #2   Title  improve BERG balance score to >/= 49/56 for improved balance and decreased fall risk    Time  8    Period  Weeks    Status  New    Target Date  02/18/18      PT LONG TERM GOAL #3   Title  improve timed up and go to < 20 sec for improved mobility    Baseline  24 at 4 weeks    Time  8    Period  Weeks    Status  On-going    Target Date  02/18/18      PT LONG TERM GOAL #4   Title  improve gait velocity to > 1.8 ft/sec for decreased  fall risk    Baseline  1.17 with quad cane    Time  8    Period  Weeks    Status  On-going    Target Date  02/18/18      PT LONG TERM GOAL #5   Title  amb > 500' on various indoor/outdoor surfaces modified independent with LRAD for 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 She reports she is walking much less than she could tolerate prior to increased knee pain requiring injections. This is primarily due to increased level of fatigue ( now that pain is better managed). She and husband report she is down to 110 lbs and continues to lose weight despite trying to gain weight. Encouraged to contact her PCP re: this issue. Instructed she only  has one more visit with PT (that will be covered by insurance), yet has 2 more appts scheduled. Educated to see how she feels next week, and if cannot participate to call and cancel 5/14 appt. (Also updated OT re: pt's visit count).     Rehab Potential  Good    PT Frequency  1x / week decrease from 2x/wk to 1x/wk    PT Duration  8 weeks may need to adjust based on available remaining visits    PT Treatment/Interventions  ADLs/Self Care Home Management;Electrical Stimulation;Therapeutic exercise;Therapeutic activities;Functional mobility training;Stair training;Gait training;DME Instruction;Neuromuscular re-education;Balance training;Patient/family education;Orthotic Fit/Training;Manual techniques;Vestibular;Taping;Aquatic Therapy;Passive range of motion    PT Next Visit Plan  assess LTGs and discharge; discuss if seems able to participate in cardiac rehab phase II (as off 5/7 would not recommend due to weight loss and fatigue); ? add balance exercises to HEP at counter with light inermittent support     Consulted and Agree with Plan of Care  Patient;Family member/caregiver    Family Member Consulted  husband       Patient will benefit from skilled therapeutic intervention in order to improve the following deficits and impairments:  Abnormal gait, Decreased strength, Decreased balance, Difficulty walking, Decreased mobility, Impaired perceived functional ability, Decreased activity tolerance, Decreased endurance, Decreased knowledge of use of DME, Increased edema, Impaired UE functional use  Visit Diagnosis: Unsteadiness on feet  Muscle weakness (generalized)     Problem List Patient Active Problem List   Diagnosis Date Noted  . Paroxysmal atrial fibrillation ([place]) 12/19/2017  . Chronic combined systolic and diastolic CHF (congestive heart failure) ([place]) 12/16/2017  . Debility 11/29/2017  . Benign essential HTN   . Acute blood loss anemia   . Tachycardia   . Hyperglycemia   . Supplemental oxygen dependent   . 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 Coronary artery disease 11/23/2017  . Acute on chronic systolic heart failure ([place]) 11/21/2017  . Acute respiratory failure with hypoxia ([place]) 11/16/2017  . Aspiration pneumonia ([place]) 11/16/2017  . NSTEMI (non-ST elevated myocardial infarction) ([place]) 11/16/2017  . Stress and adjustment reaction 10/13/2017  . Urticaria 10/13/2017  . Former smoker 10/13/2017  . Cerebrovascular accident (CVA) due to occlusion of left middle cerebral artery ([place])   . Right hemiplegia ([place])   . Dysphagia, post-stroke   . Hyperlipidemia     Rexanne Mano, PT 01/30/2018, 5:02 PM  [facility] [address] [place], [place], [address] Phone: [phone]   Fax:  [phone]  Name: LONNETTA KNISKERN MRN: [id number] Date of Birth: March 31, 1961

## 2018-01-30 NOTE — Therapy (Signed)
[facility] [address] [place], [place], [address] Phone: [phone]   Fax:  [phone]  Occupational Therapy Treatment  Patient Details  Name: [name] MRN: [id number] Date of Birth: 07/30/1961 Referring Provider: Delice Lesch MD   Encounter Date: 01/30/2018  OT End of Session - 01/30/18 1440    Visit Number  6    Number of Visits  8    Date for OT Re-Evaluation  02/13/18    Authorization Type  BC/BS    Authorization - Visit Number  14    Authorization - Number of Visits  16    OT Start Time  1020    OT Stop Time  1102    OT Time Calculation (min)  42 min       Past Medical History:  Diagnosis Date  . Arthritis    "knees" (11/16/2017)  . CAD (coronary artery disease)    a. s/p NSTEMI in 10/2017 with cath showing LM disease --> s/p CABG on 11/23/2017 with LIMA-LAD and SVG-OM.   [name] Hay fever   . Pneumonia 11/16/2017  . Stroke [facility]) 08/23/2017   Left MCA infarct status post TPA and thrombectomy    Past Surgical History:  Procedure Laterality Date  . CORONARY ARTERY BYPASS GRAFT N/A 11/23/2017   Procedure: CORONARY ARTERY BYPASS GRAFTING (CABG) x 2, ON PUMP, USING LEFT INTERNAL MAMMARY ARTERY AND RIGHT GREATER SAPHENOUS VEIN HARVESTED ENDOSCOPICALLY;  Surgeon: Gaye Pollack, MD;  Location: [facility];  Service: Open Heart Surgery;  Laterality: N/A;  . CRYOABLATION     cervical  . IR ANGIO INTRA EXTRACRAN SEL COM CAROTID INNOMINATE UNI R MOD SED  08/21/2017  . IR ANGIO VERTEBRAL SEL SUBCLAVIAN INNOMINATE UNI R MOD SED  08/21/2017  . IR PERCUTANEOUS ART THROMBECTOMY/INFUSION INTRACRANIAL INC DIAG ANGIO  08/21/2017  . IR RADIOLOGIST EVAL & MGMT  10/30/2017  . LOOP RECORDER INSERTION N/A 08/28/2017   Procedure: LOOP RECORDER INSERTION;  Surgeon: Constance Haw, MD;  Location: [place] [facility];  Service: Cardiovascular;  Laterality: N/A;  . RADIOLOGY WITH ANESTHESIA N/A 08/21/2017   Procedure: RADIOLOGY  WITH ANESTHESIA;  Surgeon: Luanne Bras, MD;  Location: [facility];  Service: Radiology;  Laterality: N/A;  . RIGHT/LEFT HEART CATH AND CORONARY ANGIOGRAPHY N/A 11/20/2017   Procedure: RIGHT/LEFT HEART CATH AND CORONARY ANGIOGRAPHY;  Surgeon: Jettie Booze, MD;  Location: [place] [facility];  Service: Cardiovascular;  Laterality: N/A;  . TEE WITHOUT CARDIOVERSION N/A 08/28/2017   Procedure: TRANSESOPHAGEAL ECHOCARDIOGRAM (TEE);  Surgeon: Fay Records, MD;  Location: [facility];  Service: Cardiovascular;  Laterality: N/A;  . TEE WITHOUT CARDIOVERSION N/A 11/23/2017   Procedure: TRANSESOPHAGEAL ECHOCARDIOGRAM (TEE);  Surgeon: Gaye Pollack, MD;  Location: [facility];  Service: Open Heart Surgery;  Laterality: N/A;  . TUBAL LIGATION      There were no vitals filed for this visit.  Subjective Assessment - 01/30/18 1024    Subjective   I want to be able to write more than anything. My arm feels like it's getting weaker    Pertinent History  recent CABG x 2 11/23/17, Lt CVA 07/2017. PMH: CAD, HTN, MI    Limitations  sternal precautions including no lifting > 5 lbs, *loop recorder, fall risk, no driving    Patient Stated Goals  Get my arm working    Currently in Pain?  Yes    Pain Score  8     Pain Location  Knee L  knee 5/10    Pain Orientation  Left;Right    Pain Descriptors / Indicators  Aching    Pain Type  Chronic pain    Pain Onset  Yesterday    Pain Frequency  Constant    Aggravating Factors   walking    Pain Relieving Factors  ice                Treatment: Seated edge of mat, weightbearing through RUE, then UE ranger for low range shoulder flexion, mod facilitation. Pt performed self ROM AA/ROM shoulder flexion with her quad cane, min v.c Seated at table A/ROM/AA/ROM elbow flexion, wrist flexion / extension, supination pronation and gentle finger flexion/ extension, min-mod facilitation/ v.c Pt fatigues quickly.                 OT Long Term Goals -  01/22/18 1209      OT LONG TERM GOAL #1   Title  Independent with updated HEP    Time  4    Period  Weeks    Status  On-going      OT LONG TERM GOAL #2   Title  Pt to verbalize understanding with edema management strategies Rt hand and positioning for Rt shoulder to minimize pain    Time  4    Period  Weeks    Status  New      OT LONG TERM GOAL #3   Title  Pt to verbalize understanding with A/E needs and task modifications to increase independence with ADLS/IADLS    Time  4    Period  Weeks    Status  Achieved      OT LONG TERM GOAL #4   Title  Pt to make sandwich/snack/microwaveable meals consistently at mod I level using A/E prn    Time  4    Period  Weeks    Status  On-going      OT LONG TERM GOAL #5   Title  Pt to perform light housework (laundry, dishes) at modified independence level    Time  4    Period  Weeks    Status  On-going            Plan - 01/30/18 1440    Clinical Impression Statement  Pt demonstrates progress towards goals. Unfortunately pt reports continued undesired weight loss, today her weight was 110 lbs.    Occupational Profile and client history currently impacting functional performance  Recent CABG x 2 11/23/17, CVA 07/2017, MI, CAD    Occupational performance deficits (Please refer to evaluation for details):  ADL's;IADL's;Social Participation;Other    Rehab Potential  Good    OT Frequency  1x / week    OT Duration  8 weeks    OT Treatment/Interventions  Self-care/ADL training;Moist Heat;Fluidtherapy;DME and/or AE instruction;Splinting;Aquatic Therapy;Therapeutic activities;Compression bandaging;Other (comment);Therapeutic exercise;Neuromuscular education;Functional Mobility Training;Passive range of motion;Visual/perceptual remediation/compensation;Manual Therapy;Electrical Stimulation;Energy conservation;Patient/family education    Plan  continue progress towards LTG's, reinforce HEP    Consulted and Agree with Plan of Care  Patient;Family  member/caregiver       Patient will benefit from skilled therapeutic intervention in order to improve the following deficits and impairments:  Decreased coordination, Decreased range of motion, Difficulty walking, Decreased endurance, Impaired tone, Decreased activity tolerance, Decreased knowledge of precautions, Decreased balance, Decreased knowledge of use of DME, Impaired UE functional use, Pain, Decreased mobility, Decreased strength  Visit Diagnosis: Hemiplegia and hemiparesis following cerebral infarction affecting right dominant side (HCC)  Muscle weakness (  generalized)  Other lack of coordination    Problem List Patient Active Problem List   Diagnosis Date Noted  . Paroxysmal atrial fibrillation ([place]) 12/19/2017  . Chronic combined systolic and diastolic CHF (congestive heart failure) ([place]) 12/16/2017  . Debility 11/29/2017  . Benign essential HTN   . Acute blood loss anemia   . Tachycardia   . Hyperglycemia   . Supplemental oxygen dependent   . 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 Coronary artery disease 11/23/2017  . Acute on chronic systolic heart failure ([place]) 11/21/2017  . Acute respiratory failure with hypoxia ([place]) 11/16/2017  . Aspiration pneumonia ([place]) 11/16/2017  . NSTEMI (non-ST elevated myocardial infarction) ([place]) 11/16/2017  . Stress and adjustment reaction 10/13/2017  . Urticaria 10/13/2017  . Former smoker 10/13/2017  . Cerebrovascular accident (CVA) due to occlusion of left middle cerebral artery ([place])   . Right hemiplegia ([place])   . Dysphagia, post-stroke   . Hyperlipidemia     RINE,KATHRYN 01/30/2018, 2:42 PM  [facility] [address] [place], [place], [address] Phone: [phone]   Fax:  [phone]  Name: BLAZE SANDIN MRN: [id number] Date of Birth: [birth year]

## 2018-01-30 NOTE — Patient Instructions (Signed)
  1. Work on walking as much as you can every day!  Your knee pain should gradually improve as your legs get stronger.   2. Every day lie down on your bed and practice lying on your side. Try to work through the pain up to a 5 out of 10. Don't push beyond a 5.   3. Make an appointment with your primary care doctor regarding your weight loss despite eating to GAIN weight.

## 2018-01-31 NOTE — Telephone Encounter (Signed)
Thank you for the communication. I appreciate it.  Dr. Raoul Pitch

## 2018-02-01 ENCOUNTER — Ambulatory Visit: Payer: BLUE CROSS/BLUE SHIELD | Admitting: Physical Therapy

## 2018-02-01 ENCOUNTER — Encounter: Payer: BLUE CROSS/BLUE SHIELD | Admitting: Occupational Therapy

## 2018-02-01 ENCOUNTER — Telehealth: Payer: Self-pay | Admitting: Cardiology

## 2018-02-01 NOTE — Telephone Encounter (Signed)
Spoke w/ pt hsuband and requested that sheh send a manual transmission b/c her home monitor has not updated in at least 14 days.

## 2018-02-02 ENCOUNTER — Encounter: Payer: Self-pay | Admitting: Family Medicine

## 2018-02-02 ENCOUNTER — Ambulatory Visit: Payer: BLUE CROSS/BLUE SHIELD | Admitting: Family Medicine

## 2018-02-02 VITALS — BP 131/71 | HR 83 | Temp 97.8°F | Resp 20 | Ht 61.0 in | Wt 109.5 lb

## 2018-02-02 DIAGNOSIS — Z01810 Encounter for preprocedural cardiovascular examination: Secondary | ICD-10-CM | POA: Diagnosis not present

## 2018-02-02 DIAGNOSIS — I219 Acute myocardial infarction, unspecified: Secondary | ICD-10-CM

## 2018-02-02 DIAGNOSIS — I700 Atherosclerosis of aorta: Secondary | ICD-10-CM | POA: Diagnosis not present

## 2018-02-02 DIAGNOSIS — Z1211 Encounter for screening for malignant neoplasm of colon: Secondary | ICD-10-CM

## 2018-02-02 DIAGNOSIS — Z1159 Encounter for screening for other viral diseases: Secondary | ICD-10-CM

## 2018-02-02 DIAGNOSIS — R5383 Other fatigue: Secondary | ICD-10-CM | POA: Diagnosis not present

## 2018-02-02 DIAGNOSIS — Z1239 Encounter for other screening for malignant neoplasm of breast: Secondary | ICD-10-CM

## 2018-02-02 DIAGNOSIS — R634 Abnormal weight loss: Secondary | ICD-10-CM | POA: Diagnosis not present

## 2018-02-02 DIAGNOSIS — D649 Anemia, unspecified: Secondary | ICD-10-CM

## 2018-02-02 DIAGNOSIS — Z1231 Encounter for screening mammogram for malignant neoplasm of breast: Secondary | ICD-10-CM | POA: Diagnosis not present

## 2018-02-02 DIAGNOSIS — Z9289 Personal history of other medical treatment: Secondary | ICD-10-CM | POA: Diagnosis not present

## 2018-02-02 DIAGNOSIS — E2839 Other primary ovarian failure: Secondary | ICD-10-CM | POA: Diagnosis not present

## 2018-02-02 DIAGNOSIS — R0602 Shortness of breath: Secondary | ICD-10-CM

## 2018-02-02 LAB — CBC WITH DIFFERENTIAL/PLATELET
BASOS PCT: 0.7 % (ref 0.0–3.0)
Basophils Absolute: 0.1 10*3/uL (ref 0.0–0.1)
Eosinophils Absolute: 0.2 10*3/uL (ref 0.0–0.7)
Eosinophils Relative: 1.9 % (ref 0.0–5.0)
HCT: 42.1 % (ref 36.0–46.0)
HEMOGLOBIN: 13.3 g/dL (ref 12.0–15.0)
LYMPHS ABS: 2 10*3/uL (ref 0.7–4.0)
Lymphocytes Relative: 20.3 % (ref 12.0–46.0)
MCHC: 31.7 g/dL (ref 30.0–36.0)
MCV: 82 fl (ref 78.0–100.0)
MONOS PCT: 7.3 % (ref 3.0–12.0)
Monocytes Absolute: 0.7 10*3/uL (ref 0.1–1.0)
Neutro Abs: 6.8 10*3/uL (ref 1.4–7.7)
Neutrophils Relative %: 69.8 % (ref 43.0–77.0)
Platelets: 397 10*3/uL (ref 150.0–400.0)
RBC: 5.13 Mil/uL — ABNORMAL HIGH (ref 3.87–5.11)
RDW: 18.8 % — AB (ref 11.5–15.5)
WBC: 9.8 10*3/uL (ref 4.0–10.5)

## 2018-02-02 LAB — TSH: TSH: 1.98 u[IU]/mL (ref 0.35–4.50)

## 2018-02-02 LAB — T4, FREE: Free T4: 0.78 ng/dL (ref 0.60–1.60)

## 2018-02-02 LAB — C-REACTIVE PROTEIN: CRP: 0.8 mg/dL (ref 0.5–20.0)

## 2018-02-02 LAB — T3, FREE: T3 FREE: 3.2 pg/mL (ref 2.3–4.2)

## 2018-02-02 NOTE — Patient Instructions (Signed)
We will complete some labs today to start work up.  Continue to eat healthy meals three times a day.  This could be normal weight loss with healthy eating, since BMI is still ok. However we should make sure it is not another condition presenting.  We will try to get the cologuard approve, but we may have to send to GI.   If all labs normal would refer to pulmonology for COPD evaluation.

## 2018-02-02 NOTE — Progress Notes (Signed)
[name] , 1961-05-06, 57 y.o., female MRN: [id number] Patient Care Team    Relationship Specialty Notifications Start End  [name], DO PCP - General Family Medicine  10/13/17   Satira Sark, MD PCP - Cardiology Cardiology Admissions 12/15/17   Constance Haw, MD Consulting Physician Cardiology  10/13/17   Rosalin Hawking, MD Consulting Physician Neurology  10/13/17   Jamse Arn, MD Consulting Physician Physical Medicine and Rehabilitation  10/13/17   Luanne Bras, MD Consulting Physician Interventional Radiology  10/13/17     Chief Complaint  Patient presents with  . unintentional weight loss    generalized weakness     Subjective: Pt presents for an OV with complaints of weakness and weight loss of a few months duration.  Associated symptoms include fatigue, mild shortness of breath with exertion, unintentional weight loss. Within the last 6 months she has had a stroke, causing permanent deficits right side, aspiraton pneumonia, NSTEMI s/p CABG x2. She was a former smoker, quit after her stroke. She has had anemia since her MI, however counts have continued to slowly recover last hgb/hct  10.0/32.5 Platelets have continued to rise, last count > 600. CRP was elevated, however she was dx with aspiration PNA at that time.  She and her husband, which is with her and provides information, reports she is eating well. She is exercising and working with PT. She denies blood loss vaginally or through stool. She has never had a colonoscopy. She has not had a mammogram in many years. She has not had a pelvic exam in many years.   cxr repeated 12/27/2017: Stable.  CLINICAL DATA:  Recent coronary artery bypass grafting  EXAM: CHEST - 2 VIEW  COMPARISON:  November 30, 2017  FINDINGS: There is no edema or consolidation. Heart is mildly enlarged with pulmonary vascularity within normal limits. There is a loop recorder on the left anteriorly. Patient is status post  coronary artery bypass grafting. There are surgical clips in the right upper hemithorax region. No pneumothorax. No evident adenopathy. No bone lesions. There is aortic atherosclerosis.  IMPRESSION: No edema or consolidation. Stable cardiac prominence. Aortic atherosclerosis. Patient is status post coronary artery bypass grafting. Loop recorder on the left.  Aortic Atherosclerosis (ICD10-I70.0).  Depression screen [facility] 2/9 11/10/2017 11/10/2017 10/13/2017 10/05/2017 10/05/2017  Decreased Interest 0 0 0 1 0  Down, Depressed, Hopeless 0 0 0 0 0  PHQ - 2 Score 0 0 0 1 0  Altered sleeping 0 - - 0 -  Tired, decreased energy 0 - - 2 -  Change in appetite 0 - - 0 -  Feeling bad or failure about yourself  0 - - 0 -  Trouble concentrating 0 - - 1 -  Moving slowly or fidgety/restless 0 - - 1 -  Suicidal thoughts 0 - - 0 -  PHQ-9 Score 0 - - 5 -  Difficult doing work/chores Not difficult at all - - Not difficult at all -    Allergies  Allergen Reactions  . Lopressor [Metoprolol Tartrate] Swelling    Angioedema  . Lipitor [Atorvastatin] Diarrhea   Social History   Tobacco Use  . Smoking status: Former Smoker    Packs/day: 0.12    Years: 37.00    Pack years: 4.44    Types: Cigarettes    Last attempt to quit: 08/21/2017    Years since quitting: 0.4  . Smokeless tobacco: Never Used  Substance Use Topics  . Alcohol use:  No    Frequency: Never   Past Medical History:  Diagnosis Date  . Arthritis    "knees" (11/16/2017)  . CAD (coronary artery disease)    a. s/p NSTEMI in 10/2017 with cath showing LM disease --> s/p CABG on 11/23/2017 with LIMA-LAD and SVG-OM.   [name] Hay fever   . Pneumonia 11/16/2017  . 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 CORONARY ARTERY BYPASS GRAFT N/A 11/23/2017   Procedure: CORONARY ARTERY BYPASS GRAFTING (CABG) x 2, ON PUMP, USING LEFT INTERNAL MAMMARY ARTERY AND  RIGHT GREATER SAPHENOUS VEIN HARVESTED ENDOSCOPICALLY;  Surgeon: Gaye Pollack, MD;  Location: [place];  Service: Open Heart Surgery;  Laterality: N/A;  . CRYOABLATION     cervical  . IR ANGIO INTRA EXTRACRAN SEL COM CAROTID INNOMINATE UNI R MOD SED  08/21/2017  . IR ANGIO VERTEBRAL SEL SUBCLAVIAN INNOMINATE UNI R MOD SED  08/21/2017  . IR PERCUTANEOUS ART THROMBECTOMY/INFUSION INTRACRANIAL INC DIAG ANGIO  08/21/2017  . IR RADIOLOGIST EVAL & MGMT  10/30/2017  . LOOP RECORDER INSERTION N/A 08/28/2017   Procedure: LOOP RECORDER INSERTION;  Surgeon: Constance Haw, MD;  Location: [place] [facility];  Service: Cardiovascular;  Laterality: N/A;  . RADIOLOGY WITH ANESTHESIA N/A 08/21/2017   Procedure: RADIOLOGY WITH ANESTHESIA;  Surgeon: Luanne Bras, MD;  Location: [place];  Service: Radiology;  Laterality: N/A;  . RIGHT/LEFT HEART CATH AND CORONARY ANGIOGRAPHY N/A 11/20/2017   Procedure: RIGHT/LEFT HEART CATH AND CORONARY ANGIOGRAPHY;  Surgeon: Jettie Booze, MD;  Location: [place] [facility];  Service: Cardiovascular;  Laterality: N/A;  . TEE WITHOUT CARDIOVERSION N/A 08/28/2017   Procedure: TRANSESOPHAGEAL ECHOCARDIOGRAM (TEE);  Surgeon: Fay Records, MD;  Location: [place];  Service: Cardiovascular;  Laterality: N/A;  . TEE WITHOUT CARDIOVERSION N/A 11/23/2017   Procedure: TRANSESOPHAGEAL ECHOCARDIOGRAM (TEE);  Surgeon: Gaye Pollack, MD;  Location: [place];  Service: Open Heart Surgery;  Laterality: N/A;  . TUBAL LIGATION     Family History  Problem Relation Age of Onset  . Heart attack Father   . Hypertension Mother   . Hyperlipidemia Mother   . Diabetes type II Mother   . Hypertension Brother    Allergies as of 02/02/2018      Reactions   Lopressor [metoprolol Tartrate] Swelling   Angioedema   Lipitor [atorvastatin] Diarrhea      Medication List        Accurate as of 02/02/18 10:48 AM. Always use your most recent med list.          baclofen 10 MG  tablet Commonly known as:  LIORESAL baclofen 10 mg tablet   carvedilol 6.25 MG tablet Commonly known as:  COREG Take 1 tablet (6.25 mg total) by mouth 2 (two) times daily with a meal.   escitalopram 10 MG tablet Commonly known as:  LEXAPRO Take 1 tablet (10 mg total) by mouth daily.   famotidine 20 MG tablet Commonly known as:  PEPCID famotidine 20 mg tablet   furosemide 20 MG tablet Commonly known as:  LASIX Take 1 tablet (20 mg total) by mouth daily.   Potassium Chloride ER 20 MEQ Tbcr Take 20 mEq by mouth daily.   rivaroxaban 20 MG Tabs tablet Commonly known as:  XARELTO Take 1 tablet (20 mg total) by mouth daily with supper.   rosuvastatin 20 MG tablet Commonly known as:  CRESTOR Take  1 tablet (20 mg total) by mouth daily.   traMADol 50 MG tablet Commonly known as:  ULTRAM Take 1 tablet (50 mg total) by mouth every 12 (twelve) hours as needed for moderate pain.       All past medical history, surgical history, allergies, family history, immunizations andmedications were updated in the EMR today and reviewed under the history and medication portions of their EMR.     ROS: Negative, with the exception of above mentioned in HPI   Objective:  BP 131/71 (BP Location: Left Arm, Patient Position: Sitting, Cuff Size: Normal)   Pulse 83   Temp 97.8 F (36.6 C)   Resp 20   Ht 5\' 1"  (1.549 m)   Wt 109 lb 8 oz (49.7 kg)   LMP  (LMP Unknown)   SpO2 96%   BMI 20.69 kg/m  Body mass index is 20.69 kg/m. Gen: Afebrile. No acute distress. Nontoxic in appearance, well developed, well nourished.  HENT: AT. [place]. MMM, Eyes:Pupils Equal Round Reactive to light, Extraocular movements intact,  Conjunctiva without redness, discharge or icterus. Neck/lymp/endocrine: Supple,no lymphadenopathy CV: RRR no murmur, no edema Chest: CTAB, no wheeze or crackles. Good air movement, normal resp effort.  Abd: Soft.  NTND. BS present. no Masses palpated. No rebound or guarding. * Neuro:  unchanged (walks with cane 2/2 CVA). PERLA. EOMi. Alert. Oriented x3  Psych: Normal affect, dress and demeanor. Normal speech. Normal thought content and judgment.  No exam data present No results found. No results found for this or any previous visit (from the past 24 hour(s)).  Assessment/Plan: LYNDAL ALAMILLO is a 57 y.o. female present for OV for fatigue/unintetnional weight loss Long discussion with patient and her husband today surrounding Venna's fatigue and weight loss. There are many potential causes of her unintentional weight loss including simply eating more healthy and exercise vs endocrine vs cardiac vs pulm vs onc. She is overdue on many screenings, former smoker, recent MI and Stroke, recent illness etc.She has lost 16 lbs in 3 months.  By review she sounds to be taking in good daily caloric intake.  recommend start on updating some of her screenings: mammogram, DEXA, cologuard (if can get approved), FOBT, thyroid and iron panels. Continue b12 and Vit d supplements.  Breast cancer screening - MM 3D SCREEN BREAST BILATERAL; Future Estrogen deficiency - DG Bone Density; Future Anemia, unspecified type/Shortness of breath on exertion/fatigue/muscle weakness - iron panel.  - FOBT - cologuard- would be high risk for anes with recent MI cabg. - consider pulm referral for PFT/former smoker/aspiration PNA. Recent CT chest w/out nodules (if labs are w/out cause).  - consider PTH/CA/Vitd and cortisol levels if other labs normal.  Unintentional weight loss/fatigue - TSH - T4, free - T3, free - Iron, TIBC and Ferritin Panel - C-reactive protein - Cologuard Colon cancer screening - Cologuard Need for hepatitis C screening test - Hepatitis C Antibody Atherosclerosis of aorta (HCC) - on statin and blood thinner.    Reviewed expectations re: course of current medical issues.  Discussed self-management of symptoms.  Outlined signs and symptoms indicating need for more acute  intervention.  Patient verbalized understanding and all questions were answered.  Patient received an After-Visit Summary.    No orders of the defined types were placed in this encounter.    Note is dictated utilizing voice recognition software. Although note has been proof read prior to signing, occasional typographical errors still can be missed. If any questions arise, please do not  hesitate to call for verification.   electronically signed by:  Howard Pouch, DO  [place]

## 2018-02-04 LAB — IRON,TIBC AND FERRITIN PANEL
%SAT: 10 % (calc) — ABNORMAL LOW (ref 11–50)
FERRITIN: 17 ng/mL (ref 10–232)
Iron: 31 ug/dL — ABNORMAL LOW (ref 45–160)
TIBC: 326 mcg/dL (calc) (ref 250–450)

## 2018-02-04 LAB — HEPATITIS C ANTIBODY
Hepatitis C Ab: NONREACTIVE
SIGNAL TO CUT-OFF: 0.02 (ref <1.00)

## 2018-02-04 NOTE — Progress Notes (Signed)
Cardiology Office Note    Date:  02/05/2018   ID:  [name], DOB 09/16/1961, MRN [id number]  PCP:  Kaitlyn Hillock, DO  Cardiologist: Kaitlyn Lesches, MD    Chief Complaint  Patient presents with  . Follow-up    6-week visit    History of Present Illness:    [name] is a 57 y.o. female with past medical history of CAD (s/p NSTEMI in 10/2017 with 3-vessel CAD by cath and CABG on 11/23/2017 with LIMA-LAD and SVG-OM), ischemic cardiomyopathy (EF 35-40% by echo in 10/2017), HTN, HLD, and prior CVA who presents to the office today for 6-week follow-up.   She was last examined by myself on 12/15/2017 at a hospital follow-up visit and at that time she reported overall progressing well. Sternal discomfort continued to improve and she denied any exertional pain or dyspnea. She was tachycardiac with HR in the 120's and EKG confirmed sinus tachycardia, with Coreg being further titrated to 12.5mg  BID to assist with her tachycardia and in the setting of her cardiomyopathy. She was only on ASA 325mg  daily and it was confirmed with Dr. Cyndia Stephenson to initiate DAPT in the setting of her ACS event, therefore she was started on Brilinta 90mg  BID and ASA was reduced to 81mg  daily.   In the interim, her ILR was interrogated and she was found to have episodes of paroxysmal atrial fibrillation. Therefore, she was evaluated by Dr. Curt Stephenson on 12/22/2017 and started on Xarelto 20mg  daily. Appears both ASA and Brilinta were discontinued at that time. She was continued on her current medication regimen at the time of her visit with Dr. Cyndia Stephenson on 12/27/2017.  In talking with the patient today, she reports overall doing well from a cardiac perspective since her last office visit. She denies any recent chest pain or dyspnea on exertion. No recent orthopnea, PND, or lower extremity edema. Her main complaint is that she continues to experience significant fatigue. This is being further worked up by her PCP and she  was recently started on iron supplementation. Does not follow BP at home but it is soft at 100/64 during today's visit.   She remains on Xarelto and denies any recent melena, hematochezia, or hematuria. Does experience easy bruising and developed an ecchymosis along her right axilla a few days ago.   Past Medical History:  Diagnosis Date  . Acute and chronic respiratory failure with hypoxia ([place]) 10/2017   w/nstemi  . Arthritis    "knees" (11/16/2017)  . Aspiration pneumonia ([place]) 10/2017  . CAD (coronary artery disease)    a. s/p NSTEMI in 10/2017 with cath showing LM disease --> s/p CABG on 11/23/2017 with LIMA-LAD and SVG-OM.   [name] Hay fever   . Ischemic cardiomyopathy    a. EF 35-40% by echo in 10/2017  . NSTEMI (non-ST elevated myocardial infarction) ([place])   . Pneumonia 11/16/2017  . Stroke [facility]) 08/23/2017   Left MCA infarct status post TPA and thrombectomy    Past Surgical History:  Procedure Laterality Date  . CORONARY ARTERY BYPASS GRAFT N/A 11/23/2017   Procedure: CORONARY ARTERY BYPASS GRAFTING (CABG) x 2, ON PUMP, USING LEFT INTERNAL MAMMARY ARTERY AND RIGHT GREATER SAPHENOUS VEIN HARVESTED ENDOSCOPICALLY;  Surgeon: Kaitlyn Pollack, MD;  Location: [place];  Service: Open Heart Surgery;  Laterality: N/A;  . CRYOABLATION     cervical  . IR ANGIO INTRA EXTRACRAN SEL COM CAROTID INNOMINATE UNI R MOD SED  08/21/2017  . IR ANGIO VERTEBRAL  SEL SUBCLAVIAN INNOMINATE UNI R MOD SED  08/21/2017  . IR PERCUTANEOUS ART THROMBECTOMY/INFUSION INTRACRANIAL INC DIAG ANGIO  08/21/2017  . IR RADIOLOGIST EVAL & MGMT  10/30/2017  . LOOP RECORDER INSERTION N/A 08/28/2017   Procedure: LOOP RECORDER INSERTION;  Surgeon: Kaitlyn Haw, MD;  Location: [place] [facility];  Service: Cardiovascular;  Laterality: N/A;  . RADIOLOGY WITH ANESTHESIA N/A 08/21/2017   Procedure: RADIOLOGY WITH ANESTHESIA;  Surgeon: Kaitlyn Bras, MD;  Location: [place];  Service: Radiology;  Laterality: N/A;  .  RIGHT/LEFT HEART CATH AND CORONARY ANGIOGRAPHY N/A 11/20/2017   Procedure: RIGHT/LEFT HEART CATH AND CORONARY ANGIOGRAPHY;  Surgeon: Kaitlyn Booze, MD;  Location: [place] [facility];  Service: Cardiovascular;  Laterality: N/A;  . TEE WITHOUT CARDIOVERSION N/A 08/28/2017   Procedure: TRANSESOPHAGEAL ECHOCARDIOGRAM (TEE);  Surgeon: Kaitlyn Records, MD;  Location: [place];  Service: Cardiovascular;  Laterality: N/A;  . TEE WITHOUT CARDIOVERSION N/A 11/23/2017   Procedure: TRANSESOPHAGEAL ECHOCARDIOGRAM (TEE);  Surgeon: Kaitlyn Pollack, MD;  Location: [place];  Service: Open Heart Surgery;  Laterality: N/A;  . TUBAL LIGATION      Current Medications: Outpatient Medications Prior to Visit  Medication Sig Dispense Refill  . baclofen (LIORESAL) 10 MG tablet baclofen 10 mg tablet    . escitalopram (LEXAPRO) 10 MG tablet Take 1 tablet (10 mg total) by mouth daily. 90 tablet 1  . famotidine (PEPCID) 20 MG tablet Take 20 mg by mouth 2 (two) times daily.    . Ferrous Sulfate 220 (44 Fe) MG/5ML LIQD Take 5 mLs by mouth 3 (three) times daily as needed. 473 mL 2  . furosemide (LASIX) 20 MG tablet Take 1 tablet (20 mg total) by mouth daily. 90 tablet 1  . potassium chloride 20 MEQ TBCR Take 20 mEq by mouth daily. 90 tablet 1  . rivaroxaban (XARELTO) 20 MG TABS tablet Take 1 tablet (20 mg total) by mouth daily with supper. 30 tablet 6  . rosuvastatin (CRESTOR) 20 MG tablet Take 1 tablet (20 mg total) by mouth daily. 90 tablet 3  . traMADol (ULTRAM) 50 MG tablet Take 1 tablet (50 mg total) by mouth every 12 (twelve) hours as needed for moderate pain. 60 tablet 2  . carvedilol (COREG) 6.25 MG tablet Take 1 tablet (6.25 mg total) by mouth 2 (two) times daily with a meal. 60 tablet 0  . famotidine (PEPCID) 20 MG tablet famotidine 20 mg tablet     No facility-administered medications prior to visit.      Allergies:   Lopressor [metoprolol tartrate] and Lipitor [atorvastatin]   Social History    Socioeconomic History  . Marital status: Married    Spouse name: Not on file  . Number of children: 2  . Years of education: Not on file  . Highest education level: Not on file  Occupational History  . Not on file  Social Needs  . Financial resource strain: Not on file  . Food insecurity:    Worry: Not on file    Inability: Not on file  . Transportation needs:    Medical: Not on file    Non-medical: Not on file  Tobacco Use  . Smoking status: Former Smoker    Packs/day: 0.12    Years: 37.00    Pack years: 4.44    Types: Cigarettes    Last attempt to quit: 08/21/2017    Years since quitting: 0.4  . Smokeless tobacco: Never Used  Substance and Sexual Activity  .  Alcohol use: No    Frequency: Never  . Drug use: No  . Sexual activity: Not on file  Lifestyle  . Physical activity:    Days per week: Not on file    Minutes per session: Not on file  . Stress: Not on file  Relationships  . Social connections:    Talks on phone: Patient refused    Gets together: Patient refused    Attends religious service: Patient refused    Active member of club or organization: Patient refused    Attends meetings of clubs or organizations: Patient refused    Relationship status: Patient refused  Other Topics Concern  . Not on file  Social History Narrative   Married. 2 children.    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    Walks with cane or wheelchair.    Former smoker.    Drinks caffeine.    Smoke alarm in the home.   Firearms in the home.    Wears her seatbelt.    Feels safe In her relationships.      Family History:  The patient's family history includes Diabetes type II in her mother; Heart attack in her father; Hyperlipidemia in her mother; Hypertension in her brother and mother.   Review of Systems:   Please see the history of present illness.     General:  No chills, fever, night sweats or weight changes. Positive for fatigue and generalized weakness.  Cardiovascular:  No  chest pain, dyspnea on exertion, edema, orthopnea, palpitations, paroxysmal nocturnal dyspnea. Dermatological: No rash, lesions/masses Respiratory: No cough, dyspnea Urologic: No hematuria, dysuria Abdominal:   No nausea, vomiting, diarrhea, bright red blood per rectum, melena, or hematemesis Neurologic:  No visual changes, changes in mental status.  All other systems reviewed and are otherwise negative except as noted above.   Physical Exam:    VS:  BP 100/64   Pulse 86   Ht 5\' 1"  (1.549 m)   Wt 110 lb 9.6 oz (50.2 kg)   LMP  (LMP Unknown)   SpO2 96% Comment: on room air  BMI 20.90 kg/m    General: Well developed, thin-Caucasian female appearing in no acute distress. Head: Normocephalic, atraumatic, sclera non-icteric, no xanthomas, nares are without discharge.  Neck: No carotid bruits. JVD not elevated.  Lungs: Respirations regular and unlabored, without wheezes or rales.  Heart: Regular rate and rhythm. No S3 or S4.  No murmur, no rubs, or gallops appreciated. Abdomen: Soft, non-tender, non-distended with normoactive bowel sounds. No hepatomegaly. No rebound/guarding. No obvious abdominal masses. Msk:  Strength and tone appear normal for age. No joint deformities or effusions. Extremities: No clubbing or cyanosis. No lower extremity edema.  Distal pedal pulses are 2+ bilaterally. Right forearm in brace.  Neuro: Alert and oriented X 3. Moves all extremities spontaneously. No focal deficits noted. Psych:  Responds to questions appropriately with a normal affect. Skin: No rashes or lesions noted. Does have an ecchymosis along her left axilla with no rash appreciated at this time.   Wt Readings from Last 3 Encounters:  02/05/18 110 lb 9.6 oz (50.2 kg)  02/02/18 109 lb 8 oz (49.7 kg)  01/08/18 115 lb (52.2 kg)     Studies/Labs Reviewed:   EKG:  EKG is not ordered today.    Recent Labs: 11/18/2017: B Natriuretic Peptide 1,276.7 11/24/2017: Magnesium 2.3 11/30/2017: ALT  10 01/08/2018: BUN 17; Creat 0.79; Potassium 4.5; Sodium 143 02/02/2018: Hemoglobin 13.3; Platelets 397.0; TSH 1.98   Lipid Panel  Component Value Date/Time   CHOL 237 (H) 08/22/2017 0811   TRIG 295 (H) 08/22/2017 0811   TRIG 301 (H) 08/22/2017 0811   HDL 35 (L) 08/22/2017 0811   CHOLHDL 6.8 08/22/2017 0811   VLDL 59 (H) 08/22/2017 0811   LDLCALC 143 (H) 08/22/2017 0811    Additional studies/ Stephenson that were reviewed today include:   Echocardiogram: 11/16/2017 Study Conclusions  - Left ventricle: The cavity size was mildly dilated. Wall   thickness was increased in a pattern of mild LVH. Systolic   function was moderately reduced. The estimated ejection fraction   was in the range of 35% to 40%. Diffuse hypokinesis. There is   akinesis of the basal-midinferolateral, inferior, and   inferoseptal myocardium. Features are consistent with a   pseudonormal left ventricular filling pattern, with concomitant   abnormal relaxation and increased filling pressure (grade 2   diastolic dysfunction). - Mitral valve: Mildly thickened leaflets . There was mild to   moderate regurgitation. - Left atrium: The atrium was mildly dilated. - Right atrium: Central venous pressure (est): 8 mm Hg. - Atrial septum: No defect or patent foramen ovale was identified. - Tricuspid valve: There was trivial regurgitation. - Pulmonary arteries: Systolic pressure could not be accurately   estimated. - Pericardium, extracardiac: There was no pericardial effusion.  Cardiac Catheterization: 11/20/2017  Prox Cx to Mid Cx lesion is 100% stenosed.  Mid LM to Dist LM lesion is 75% stenosed.  Ost Cx to Prox Cx lesion is 50% stenosed.  Ost LAD to Prox LAD lesion is 40% stenosed.  Mid LAD lesion is 40% stenosed.  There is severe left ventricular systolic dysfunction.  The left ventricular ejection fraction is 25-35% by visual estimate.  There is no aortic valve stenosis.  LV end diastolic pressure is  normal.  Prox RCA lesion is 90% stenosed.  Ao sat 93%; PA sat 62%; CO 3.5 L/min; CI 2.29; mean PCWP 2 mm Hg. Normal right heart pressures.   Severe left main disease.   Severely decreased LV function.  Normal LVEDP.    Resume heparin 6 hours post sheath pull.     Assessment:    1. Coronary artery disease involving coronary bypass graft of native heart without angina pectoris   2. Chronic combined systolic and diastolic CHF (congestive heart failure) (HCC)   3. Cardiomyopathy, ischemic   4. Paroxysmal atrial fibrillation (HCC)   5. Hyperlipidemia LDL goal <70   6. History of CVA (cerebrovascular accident)     Plan:   In order of problems listed above:  1. CAD - s/p NSTEMI in 10/2017 with 3-vessel CAD by cath and CABG on 11/23/2017 with LIMA-LAD and SVG-OM. - she denies any recent chest pain or dyspnea on exertion. Does continue to experience fatigue which is likely multifactorial in the setting of her iron deficiency, hypotension, and deconditioning. - She is no longer on ASA or Brilinta given the need for anticoagulation due to recently diagnosed atrial fibrillation. Continue beta-blocker (with dose adjustment as outlined below) and statin therapy.  2. Chronic Combined Systolic and Diastolic CHF/ Ischemic Cardiomyopathy - denies any recent orthopnea, PND, or lower extremity edema. Appears euvolemic by examination today. EF was reduced to 35 to 40% by echo in 10/2017. Will plan for a repeat echocardiogram in the coming weeks as she will be 3 months out from revascularization.  - she is currently on Coreg 6.25mg  BID (never titrated to 12.5mg  BID). She reports significant fatigue and BP is soft at 100/64  during today's visit. She has been started on iron supplementation by her PCP due to an Iron level of 31 but Hgb had actually improved to 13.3 at that time. Will reduce Coreg to 3.125mg  BID to see if this improves her fatigue. Continue Lasix and K+ supplementation at current dosing.  If EF has improved by repeat echo, can likely decrease to PRN dosing. Not on ACE-I/ARB/ARNI given soft BP.   3. Paroxysmal Atrial Fibrillation - noted on recent ILR interrogation. Patient was asymptomatic with the arrhythmia.  - she has been started on Xarelto 20mg  daily for anticoagulation. Does experience easy bruising but denies any recent melena, hematochezia, or hematuria.   4. HLD - due for repeat FLP and LFT's. Not fasting today.  - remains on Rosuvastatin 20mg  daily. Goal LDL is < 70 with known CAD.   5. Prior CVA - with residual right arm weakness. Works with PT and OT multiple times per week.    Medication Adjustments/Labs and Tests Ordered: Current medicines are reviewed at length with the patient today.  Concerns regarding medicines are outlined above.  Medication changes, Labs and Tests ordered today are listed in the Patient Instructions below. Patient Instructions  Your physician recommends that you schedule a follow-up appointment in: 2-3 months with Dr.McDowell  DECREASE  Coreg to 3.125 mg twice a day  If you need a refill on your cardiac medications before your next appointment, please call your pharmacy.  Your physician has requested that you have an echocardiogram ( IN June). Echocardiography is a painless test that uses sound waves to create images of your heart. It provides your doctor with information about the size and shape of your heart and how well your heart's chambers and valves are working. This procedure takes approximately one hour. There are no restrictions for this procedure.  No lab work ordered today.  Thank you for choosing [facility] !       Signed, Erma Heritage, PA-C  02/05/2018 12:58 PM    [facility] [address] [place], [place] [address] Phone: [phone]

## 2018-02-05 ENCOUNTER — Encounter: Payer: Self-pay | Admitting: Student

## 2018-02-05 ENCOUNTER — Ambulatory Visit (INDEPENDENT_AMBULATORY_CARE_PROVIDER_SITE_OTHER): Payer: BLUE CROSS/BLUE SHIELD | Admitting: *Deleted

## 2018-02-05 ENCOUNTER — Telehealth: Payer: Self-pay | Admitting: Family Medicine

## 2018-02-05 ENCOUNTER — Ambulatory Visit: Payer: BLUE CROSS/BLUE SHIELD | Admitting: Student

## 2018-02-05 VITALS — BP 100/64 | HR 86 | Ht 61.0 in | Wt 110.6 lb

## 2018-02-05 DIAGNOSIS — I480 Paroxysmal atrial fibrillation: Secondary | ICD-10-CM | POA: Diagnosis not present

## 2018-02-05 DIAGNOSIS — I5042 Chronic combined systolic (congestive) and diastolic (congestive) heart failure: Secondary | ICD-10-CM | POA: Diagnosis not present

## 2018-02-05 DIAGNOSIS — I25810 Atherosclerosis of coronary artery bypass graft(s) without angina pectoris: Secondary | ICD-10-CM | POA: Diagnosis not present

## 2018-02-05 DIAGNOSIS — E785 Hyperlipidemia, unspecified: Secondary | ICD-10-CM

## 2018-02-05 DIAGNOSIS — I255 Ischemic cardiomyopathy: Secondary | ICD-10-CM | POA: Diagnosis not present

## 2018-02-05 DIAGNOSIS — Z8673 Personal history of transient ischemic attack (TIA), and cerebral infarction without residual deficits: Secondary | ICD-10-CM | POA: Diagnosis not present

## 2018-02-05 LAB — CUP PACEART REMOTE DEVICE CHECK
Date Time Interrogation Session: 20190408194048
Implantable Pulse Generator Implant Date: 20181203

## 2018-02-05 MED ORDER — CARVEDILOL 3.125 MG PO TABS: 3.1250 mg | ORAL_TABLET | Freq: Two times a day (BID) | ORAL | 3 refills | Status: DC

## 2018-02-05 MED ORDER — FERROUS SULFATE 220 (44 FE) MG/5ML PO LIQD: 5.0000 mL | Freq: Three times a day (TID) | ORAL | 2 refills | Status: DC | PRN

## 2018-02-05 NOTE — Patient Instructions (Signed)
Your physician recommends that you schedule a follow-up appointment in: 2-3 months with Dr.McDowell     DECREASE  Coreg to 3.125 mg twice a day    If you need a refill on your cardiac medications before your next appointment, please call your pharmacy.   Your physician has requested that you have an echocardiogram ( IN June). Echocardiography is a painless test that uses sound waves to create images of your heart. It provides your doctor with information about the size and shape of your heart and how well your heart's chambers and valves are working. This procedure takes approximately one hour. There are no restrictions for this procedure.    No lab work ordered today.       Thank you for choosing [facility] !

## 2018-02-05 NOTE — Telephone Encounter (Signed)
Spoke with patient's husband reviewed results and instructions patient's husband verbalized understanding of all instructions. He will call back to schedule 3 month follow up after he checks their schedule.

## 2018-02-05 NOTE — Progress Notes (Signed)
[facility] Summary Report / Loop Recorder 

## 2018-02-05 NOTE — Telephone Encounter (Signed)
Please inform patient the following information: [name]'s iron is low. This can certainly cause the symptoms she is having.  I have called in an iron supplement, that appears to be on her formulary. Take TID, with stool softener. Very important to take daily stool softener since iron usually causes constipation.  I also strongly recommend she start a vit d 1000u supplement daily. Continue B12. F/U 3 months for retesting iron levels. She should still have other tests (scerenings) ordered.    She should also make an appt with gyn to have PAP updated.

## 2018-02-06 ENCOUNTER — Ambulatory Visit: Payer: BLUE CROSS/BLUE SHIELD | Admitting: Physical Therapy

## 2018-02-06 ENCOUNTER — Encounter: Payer: Self-pay | Admitting: Physical Therapy

## 2018-02-06 ENCOUNTER — Ambulatory Visit: Payer: BLUE CROSS/BLUE SHIELD | Admitting: Occupational Therapy

## 2018-02-06 DIAGNOSIS — I69351 Hemiplegia and hemiparesis following cerebral infarction affecting right dominant side: Secondary | ICD-10-CM

## 2018-02-06 DIAGNOSIS — M6281 Muscle weakness (generalized): Secondary | ICD-10-CM

## 2018-02-06 DIAGNOSIS — R293 Abnormal posture: Secondary | ICD-10-CM | POA: Diagnosis not present

## 2018-02-06 DIAGNOSIS — R2689 Other abnormalities of gait and mobility: Secondary | ICD-10-CM

## 2018-02-06 DIAGNOSIS — R600 Localized edema: Secondary | ICD-10-CM

## 2018-02-06 DIAGNOSIS — R2681 Unsteadiness on feet: Secondary | ICD-10-CM | POA: Diagnosis not present

## 2018-02-06 DIAGNOSIS — R278 Other lack of coordination: Secondary | ICD-10-CM | POA: Diagnosis not present

## 2018-02-06 NOTE — Therapy (Signed)
[facility] [address] [place], [place], [address] Phone: [phone]   Fax:  [phone]  Occupational Therapy Treatment  Patient Details  Name: [name] MRN: [id number] Date of Birth: [birth year] Referring Provider: Delice Lesch MD   Encounter Date: 02/06/2018  OT End of Session - 02/06/18 1238    Visit Number  7    Number of Visits  8    Date for OT Re-Evaluation  02/13/18    Authorization Type  BC/BS    Authorization - Visit Number  15    Authorization - Number of Visits  16    OT Start Time  6962    OT Stop Time  1230    OT Time Calculation (min)  42 min    Activity Tolerance  Patient tolerated treatment well    Behavior During Therapy  [facility] for tasks assessed/performed       Past Medical History:  Diagnosis Date  . Acute and chronic respiratory failure with hypoxia ([place]) 10/2017   w/nstemi  . Arthritis    "knees" (11/16/2017)  . Aspiration pneumonia ([place]) 10/2017  . CAD (coronary artery disease)    a. s/p NSTEMI in 10/2017 with cath showing LM disease --> s/p CABG on 11/23/2017 with LIMA-LAD and SVG-OM.   [name] Hay fever   . Ischemic cardiomyopathy    a. EF 35-40% by echo in 10/2017  . NSTEMI (non-ST elevated myocardial infarction) ([place])   . Pneumonia 11/16/2017  . Stroke [facility]) 08/23/2017   Left MCA infarct status post TPA and thrombectomy    Past Surgical History:  Procedure Laterality Date  . CORONARY ARTERY BYPASS GRAFT N/A 11/23/2017   Procedure: CORONARY ARTERY BYPASS GRAFTING (CABG) x 2, ON PUMP, USING LEFT INTERNAL MAMMARY ARTERY AND RIGHT GREATER SAPHENOUS VEIN HARVESTED ENDOSCOPICALLY;  Surgeon: Gaye Pollack, MD;  Location: [facility];  Service: Open Heart Surgery;  Laterality: N/A;  . CRYOABLATION     cervical  . IR ANGIO INTRA EXTRACRAN SEL COM CAROTID INNOMINATE UNI R MOD SED  08/21/2017  . IR ANGIO VERTEBRAL SEL SUBCLAVIAN INNOMINATE UNI R MOD SED  08/21/2017  . IR PERCUTANEOUS ART  THROMBECTOMY/INFUSION INTRACRANIAL INC DIAG ANGIO  08/21/2017  . IR RADIOLOGIST EVAL & MGMT  10/30/2017  . LOOP RECORDER INSERTION N/A 08/28/2017   Procedure: LOOP RECORDER INSERTION;  Surgeon: Constance Haw, MD;  Location: [place] CV LAB;  Service: Cardiovascular;  Laterality: N/A;  . RADIOLOGY WITH ANESTHESIA N/A 08/21/2017   Procedure: RADIOLOGY WITH ANESTHESIA;  Surgeon: Luanne Bras, MD;  Location: [facility];  Service: Radiology;  Laterality: N/A;  . RIGHT/LEFT HEART CATH AND CORONARY ANGIOGRAPHY N/A 11/20/2017   Procedure: RIGHT/LEFT HEART CATH AND CORONARY ANGIOGRAPHY;  Surgeon: Jettie Booze, MD;  Location: [place] CV LAB;  Service: Cardiovascular;  Laterality: N/A;  . TEE WITHOUT CARDIOVERSION N/A 08/28/2017   Procedure: TRANSESOPHAGEAL ECHOCARDIOGRAM (TEE);  Surgeon: Fay Records, MD;  Location: [facility];  Service: Cardiovascular;  Laterality: N/A;  . TEE WITHOUT CARDIOVERSION N/A 11/23/2017   Procedure: TRANSESOPHAGEAL ECHOCARDIOGRAM (TEE);  Surgeon: Gaye Pollack, MD;  Location: [facility];  Service: Open Heart Surgery;  Laterality: N/A;  . TUBAL LIGATION      There were no vitals filed for this visit.  Subjective Assessment - 02/06/18 1148    Subjective   I don't have any further questions    Pertinent History  recent CABG x 2 11/23/17, Lt CVA 07/2017. PMH: CAD, HTN, MI  Limitations  sternal precautions including no lifting > 5 lbs, *loop recorder, fall risk, no driving    Patient Stated Goals  Get my arm working    Currently in Pain?  Yes    Pain Score  3     Pain Location  Knee    Pain Orientation  Left    Pain Descriptors / Indicators  Aching    Pain Type  Chronic pain    Pain Onset  More than a month ago    Pain Frequency  Constant    Aggravating Factors   walking    Pain Relieving Factors  rest, ice                   OT Treatments/Exercises (OP) - 02/06/18 0001      ADLs   Functional Mobility  Discussed safety in kitchen with  functional mobility and dynamic standing tasks. Recommended leaning into counter if retrieving things out of lower cabinets (pt practiced retrieving things out of high and low cabinets) and using w/c to sit and transport laundry, plate of food, etc.     ADL Comments  Reviewed goals and progress to date. Pt shown A/E (Cup holder for cane)      Neurological Re-education Exercises   Other Exercises 1  Reviewed HEP previously issued for neuro re-education RUE. Also reviewed low range AA/ROM  RUE using quad cane for home ex               OT Short Term Goals - 01/22/18 1212      OT SHORT TERM GOAL #1   Title  No STG's at this time d/t limited visits - focus on LTG's (refer to adjusted renewal on 12/25/17)        OT Long Term Goals - 02/06/18 1239      OT LONG TERM GOAL #1   Title  Independent with updated HEP    Time  4    Period  Weeks    Status  Achieved      OT LONG TERM GOAL #2   Title  Pt to verbalize understanding with edema management strategies Rt hand and positioning for Rt shoulder to minimize pain    Time  4    Period  Weeks    Status  Achieved      OT LONG TERM GOAL #3   Title  Pt to verbalize understanding with A/E needs and task modifications to increase independence with ADLS/IADLS    Time  4    Period  Weeks    Status  Achieved      OT LONG TERM GOAL #4   Title  Pt to make sandwich/snack/microwaveable meals consistently at mod I level using A/E prn    Time  4    Period  Weeks    Status  Achieved      OT LONG TERM GOAL #5   Title  Pt to perform light housework (laundry, dishes) at modified independence level    Time  4    Period  Weeks    Status  Partially Met pt is folding laundry, but unable to do other tasks d/t the way it is set up at home and fall risk             Plan - 02/06/18 1240    Clinical Impression Statement  Pt has met all LTG's or partially met LTG's from revised POC.     Occupational Profile and client history currently  impacting functional performance  Recent CABG x 2 11/23/17, CVA 07/2017, MI, CAD    Occupational performance deficits (Please refer to evaluation for details):  ADL's;IADL's;Social Participation;Other    Rehab Potential  Good    OT Frequency  1x / week    OT Duration  8 weeks    OT Treatment/Interventions  Self-care/ADL training;Moist Heat;Fluidtherapy;DME and/or AE instruction;Splinting;Aquatic Therapy;Therapeutic activities;Compression bandaging;Other (comment);Therapeutic exercise;Neuromuscular education;Functional Mobility Training;Passive range of motion;Visual/perceptual remediation/compensation;Manual Therapy;Electrical Stimulation;Energy conservation;Patient/family education    Plan  D/C O.T. d/t visit limit    Consulted and Agree with Plan of Care  Patient;Family member/caregiver    Family Member Consulted  husband       Patient will benefit from skilled therapeutic intervention in order to improve the following deficits and impairments:  Decreased coordination, Decreased range of motion, Difficulty walking, Decreased endurance, Impaired tone, Decreased activity tolerance, Decreased knowledge of precautions, Decreased balance, Decreased knowledge of use of DME, Impaired UE functional use, Pain, Decreased mobility, Decreased strength  Visit Diagnosis: Hemiplegia and hemiparesis following cerebral infarction affecting right dominant side (HCC)  Muscle weakness (generalized)  Unsteadiness on feet  Localized edema  Abnormal posture    Problem List Patient Active Problem List   Diagnosis Date Noted  . Atherosclerosis of aorta ([place]) 02/02/2018  . Paroxysmal atrial fibrillation ([place]) 12/19/2017  . Chronic combined systolic and diastolic CHF (congestive heart failure) ([place]) 12/16/2017  . Benign essential HTN   . Supplemental oxygen dependent   . 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 Coronary artery disease 11/23/2017  . NSTEMI (non-ST elevated myocardial infarction) ([place]) 11/16/2017  .  Former smoker 10/13/2017  . Cerebrovascular accident (CVA) due to occlusion of left middle cerebral artery ([place])   . Right hemiplegia ([place])   . Dysphagia, post-stroke   . Hyperlipidemia    OCCUPATIONAL THERAPY DISCHARGE SUMMARY  Visits from Start of Care: 15 (+ evaluation)   Current functional level related to goals / functional outcomes: See above   Remaining deficits: Hemiplegia RUE   Education / Equipment: HEP, A/E recommendations, safety recommendations, splint wear and care, positioning and edema management for RUE  Plan: Patient agrees to discharge.  Patient goals were met. Patient is being discharged due to financial reasons.  (Limited insurance visits)?????        Carey Bullocks, OTR/L 02/06/2018, 12:41 PM  [facility] [address] [place], [place], [address] Phone: [phone]   Fax:  [phone]  Name: SHELIE LANSING MRN: [id number] Date of Birth: 1961/04/11

## 2018-02-06 NOTE — Patient Instructions (Signed)
Hip Abduction (Standing)    Stand facing counter with light support of your hands. Lift right leg out to side, keeping toe forward. Hold for 5 seconds. Return and then lift left leg out to side.   Repeat __20_ times. Do _1__ times a day.    Copyright  VHI. All rights reserved.

## 2018-02-06 NOTE — Therapy (Signed)
[facility] [address] [place], [place], [address] Phone: [phone]   Fax:  [phone]  Physical Therapy Treatment and Discharge Summary  Patient Details  Name: [name] MRN: [id number] Date of Birth: 01/26/1961 Referring Provider: Delice Lesch MD   Encounter Date: 02/06/2018  PT End of Session - 02/06/18 1949    Visit Number  8    Number of Visits  8    Date for PT Re-Evaluation  02/18/18    Authorization Type  BCBS    Authorization Time Period  12/20/17 left message for insurance specialist to assist with clarification; VL =30, used 14 this year with 16 remaining (to share between PT and OT)    Authorization - Visit Number  16 PT & OT combined    Authorization - Number of Visits  16 PT & OT combined    PT Start Time  1058    PT Stop Time  1145    PT Time Calculation (min)  47 min    Activity Tolerance  Patient tolerated treatment well    Behavior During Therapy  [facility] for tasks assessed/performed       Past Medical History:  Diagnosis Date  . Acute and chronic respiratory failure with hypoxia ([place]) 10/2017   w/nstemi  . Arthritis    "knees" (11/16/2017)  . Aspiration pneumonia ([place]) 10/2017  . CAD (coronary artery disease)    a. s/p NSTEMI in 10/2017 with cath showing LM disease --> s/p CABG on 11/23/2017 with LIMA-LAD and SVG-OM.   [name] Hay fever   . Ischemic cardiomyopathy    a. EF 35-40% by echo in 10/2017  . NSTEMI (non-ST elevated myocardial infarction) ([place])   . Pneumonia 11/16/2017  . Stroke [facility]) 08/23/2017   Left MCA infarct status post TPA and thrombectomy    Past Surgical History:  Procedure Laterality Date  . CORONARY ARTERY BYPASS GRAFT N/A 11/23/2017   Procedure: CORONARY ARTERY BYPASS GRAFTING (CABG) x 2, ON PUMP, USING LEFT INTERNAL MAMMARY ARTERY AND RIGHT GREATER SAPHENOUS VEIN HARVESTED ENDOSCOPICALLY;  Surgeon: Gaye Pollack, MD;  Location: [facility];  Service: Open Heart Surgery;   Laterality: N/A;  . CRYOABLATION     cervical  . IR ANGIO INTRA EXTRACRAN SEL COM CAROTID INNOMINATE UNI R MOD SED  08/21/2017  . IR ANGIO VERTEBRAL SEL SUBCLAVIAN INNOMINATE UNI R MOD SED  08/21/2017  . IR PERCUTANEOUS ART THROMBECTOMY/INFUSION INTRACRANIAL INC DIAG ANGIO  08/21/2017  . IR RADIOLOGIST EVAL & MGMT  10/30/2017  . LOOP RECORDER INSERTION N/A 08/28/2017   Procedure: LOOP RECORDER INSERTION;  Surgeon: Constance Haw, MD;  Location: [place] CV LAB;  Service: Cardiovascular;  Laterality: N/A;  . RADIOLOGY WITH ANESTHESIA N/A 08/21/2017   Procedure: RADIOLOGY WITH ANESTHESIA;  Surgeon: Luanne Bras, MD;  Location: [facility];  Service: Radiology;  Laterality: N/A;  . RIGHT/LEFT HEART CATH AND CORONARY ANGIOGRAPHY N/A 11/20/2017   Procedure: RIGHT/LEFT HEART CATH AND CORONARY ANGIOGRAPHY;  Surgeon: Jettie Booze, MD;  Location: [facility] CV LAB;  Service: Cardiovascular;  Laterality: N/A;  . TEE WITHOUT CARDIOVERSION N/A 08/28/2017   Procedure: TRANSESOPHAGEAL ECHOCARDIOGRAM (TEE);  Surgeon: Fay Records, MD;  Location: [facility];  Service: Cardiovascular;  Laterality: N/A;  . TEE WITHOUT CARDIOVERSION N/A 11/23/2017   Procedure: TRANSESOPHAGEAL ECHOCARDIOGRAM (TEE);  Surgeon: Gaye Pollack, MD;  Location: [facility];  Service: Open Heart Surgery;  Laterality: N/A;  . TUBAL LIGATION      There were  no vitals filed for this visit.  Subjective Assessment - 02/06/18 1057    Subjective  Saw the MD and they added iron, Vitamin D, and cut one of the heart medicines. Saw her cardiologist and they encouraged her to participate in Cardiac Rehab and assured her there are appropriate options for exercise equipment for her.     Patient is accompained by:  Family member    Pertinent History  ischemic cardiomyopathy with CHF and EF 35-40%;  lt CVA 11/18; aspiration pneumonia with hospitalization 10/2017; CABG 11/23/17; arthritis (esp knees)    Limitations  Lifting;Walking     Patient Stated Goals  walk with more coordination and without an assistive device    Currently in Pain?  Yes    Pain Score  3     Pain Location  Knee    Pain Orientation  Right;Left    Pain Descriptors / Indicators  Aching    Pain Type  Chronic pain    Pain Onset  More than a month ago    Pain Frequency  Constant         [facility] PT Assessment - 02/06/18 1115      Ambulation/Gait   Gait velocity  32.8/29.19=1.12      Berg Balance Test   Sit to Stand  Able to stand without using hands and stabilize independently    Standing Unsupported  Able to stand safely 2 minutes    Sitting with Back Unsupported but Feet Supported on Floor or Stool  Able to sit safely and securely 2 minutes    Stand to Sit  Sits safely with minimal use of hands    Transfers  Able to transfer safely, minor use of hands    Standing Unsupported with Eyes Closed  Able to stand 10 seconds safely    Standing Ubsupported with Feet Together  Able to place feet together independently and stand 1 minute safely    From Standing, Reach Forward with Outstretched Arm  Can reach confidently >25 cm (10")    From Standing Position, Pick up Object from Floor  Able to pick up shoe safely and easily    From Standing Position, Turn to Look Behind Over each Shoulder  Looks behind from both sides and weight shifts well    Turn 360 Degrees  Able to turn 360 degrees safely but slowly    Standing Unsupported, Alternately Place Feet on Step/Stool  Able to complete 4 steps without aid or supervision    Standing Unsupported, One Foot in Front  Able to plae foot ahead of the other independently and hold 30 seconds    Standing on One Leg  Able to lift leg independently and hold 5-10 seconds    Total Score  50                   [facility] Adult PT Treatment/Exercise - 02/06/18 1115      Ambulation/Gait   Ambulation/Gait Assistance  6: Modified independent (Device/Increase time)    Ambulation/Gait Assistance Details  with [facility]; knee  pain improving    Ambulation Distance (Feet)  80 Feet 30, 75,     Assistive device  Small based quad cane    Gait Pattern  Step-to pattern;Step-through pattern;Decreased stride length;Decreased weight shift to right;Lateral trunk lean to left;Poor foot clearance - right    Ambulation Surface  Indoor      Knee/Hip Exercises: Standing   Hip Abduction  Stengthening;Both;1 set;20 reps;Knee straight bil hands on counter; alternating legs  PT Education - 02/06/18 1948    Education Details  results of LTG assessment; addition to HEP for balance and strengthening her hips (she reports was doing this exercise prior to CABG)    Person(s) Educated  Patient;Spouse    Methods  Explanation    Comprehension  Verbalized understanding       PT Short Term Goals - 01/22/18 1045      PT SHORT TERM GOAL #1   Title  verbalize understanding of sternal precautions and CVA risk factors/warning signs (Target all STGs 01/19/18)    Time  4    Period  Weeks    Status  Not Met      PT SHORT TERM GOAL #2   Title  amb >= 350' with LRAD on indoor/paved outdoor surfaces modified independent for improved function    Baseline  MOD I indoors, supervision with outdoor challenges    Time  4    Period  Weeks    Status  Partially Met      PT SHORT TERM GOAL #3   Title  improve timed up and go to < 25 sec with LRAD for improved mobility    Baseline  24 seconds with quad cane    Time  4    Period  Weeks    Status  Achieved      PT SHORT TERM GOAL #4   Title  improve gait velocity to at least 1.0 ft/sec for improved mobility    Baseline  1.17 ft/sec with quad cane     Time  4    Period  Weeks    Status  Achieved        PT Long Term Goals - 02/06/18 1951      PT LONG TERM GOAL #1   Title  independent with HEP (Target all LTGs 02/18/2018)    Time  8    Period  Weeks    Status  Achieved      PT LONG TERM GOAL #2   Title  improve BERG balance score to >/= 49/56 for improved balance and  decreased fall risk    Baseline  02/06/18  50/56    Time  8    Period  Weeks    Status  Achieved      PT LONG TERM GOAL #3   Title  improve timed up and go to < 20 sec for improved mobility    Baseline  24 at 4 weeks; 02/06/18 23.8 seconds    Time  8    Period  Weeks    Status  Not Met      PT LONG TERM GOAL #4   Title  improve gait velocity to > 1.8 ft/sec for decreased fall risk    Baseline  02/06/18  1.17 ft/sec with quad cane    Time  8    Period  Weeks    Status  Partially Met      PT LONG TERM GOAL #5   Title  amb > 500' on various indoor/outdoor surfaces modified independent with LRAD for 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 LTGs assessed with pt meeting 2 of 5 goals, partially meeting 2 of 5 goals, and did not meet 1 goal. Considering pt had a major setback due to severe knee pain after attempting use of a rolling walker (she went back to using a wheelchair for one week and ultimately had knee injections by orthopedist), she made good progress. She is encouraged by her recent improvements and intends on attending phase II Cardiac Rehab beginning in early June. All questions answered for pt and spouse. No further PT desired by pt as she has used all the visits her insurance has approved and cannot afford to pay out-of-pocket (per husband). Patient is discharged from [facility].     Rehab Potential  Good    PT Frequency  1x / week decrease from 2x/wk to 1x/wk    PT Duration  8 weeks may need to adjust based on available remaining visits    PT Treatment/Interventions  ADLs/Self  Care Home Management;Electrical Stimulation;Therapeutic exercise;Therapeutic activities;Functional mobility training;Stair training;Gait training;DME Instruction;Neuromuscular re-education;Balance training;Patient/family education;Orthotic Fit/Training;Manual techniques;Vestibular;Taping;Aquatic Therapy;Passive range of motion    Consulted and Agree with Plan of Care  Patient;Family member/caregiver    Family Member Consulted  husband       Patient will benefit from skilled therapeutic intervention in order to improve the following deficits and impairments:  Abnormal gait, Decreased strength, Decreased balance, Difficulty walking, Decreased mobility, Impaired perceived functional ability, Decreased activity tolerance, Decreased endurance, Decreased knowledge of use of DME, Increased edema, Impaired UE functional use  Visit Diagnosis: Muscle weakness (generalized)  Unsteadiness on feet  Other abnormalities of gait and mobility     Problem List Patient Active Problem List   Diagnosis Date Noted  . Atherosclerosis of aorta ([place]) 02/02/2018  . Paroxysmal atrial fibrillation ([place]) 12/19/2017  . Chronic combined systolic and diastolic CHF (congestive heart failure) ([place]) 12/16/2017  . Benign essential HTN   . Supplemental oxygen dependent   . 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 Coronary artery disease 11/23/2017  . NSTEMI (non-ST elevated myocardial infarction) ([place]) 11/16/2017  . Former smoker 10/13/2017  . Cerebrovascular accident (CVA) due to occlusion of left middle cerebral artery ([place])   . Right hemiplegia ([place])   . Dysphagia, post-stroke   . Hyperlipidemia    PHYSICAL THERAPY DISCHARGE SUMMARY  Visits from Start of Care: 8  Current functional level related to goals / functional outcomes: See goals above   Remaining deficits: Rt hemiparesis from recent CVA impacting her balance and gait   Education / Equipment: HEP  Plan: Patient agrees to discharge.  Patient goals were  partially met. Patient is being discharged due to financial reasons.  ?????        Rexanne Mano, PT 02/06/2018, 8:02 PM  [facility] [address] [place], [place], [address] Phone: [phone]   Fax:  [phone]  Name: [name] MRN: [id number] Date of Birth: 1960-11-23

## 2018-02-08 ENCOUNTER — Ambulatory Visit: Payer: BLUE CROSS/BLUE SHIELD | Admitting: Physical Therapy

## 2018-02-08 ENCOUNTER — Encounter: Payer: BLUE CROSS/BLUE SHIELD | Admitting: Occupational Therapy

## 2018-02-13 ENCOUNTER — Ambulatory Visit: Payer: BLUE CROSS/BLUE SHIELD | Admitting: Physical Therapy

## 2018-02-13 ENCOUNTER — Encounter: Payer: BLUE CROSS/BLUE SHIELD | Admitting: Occupational Therapy

## 2018-02-15 ENCOUNTER — Encounter: Payer: BLUE CROSS/BLUE SHIELD | Admitting: Occupational Therapy

## 2018-02-15 ENCOUNTER — Ambulatory Visit: Payer: BLUE CROSS/BLUE SHIELD | Admitting: Physical Therapy

## 2018-02-15 ENCOUNTER — Other Ambulatory Visit: Payer: Self-pay | Admitting: Family Medicine

## 2018-02-15 NOTE — Telephone Encounter (Signed)
Copied from [place] [id number]. Topic: Inquiry >> Feb 15, 2018 11:14 AM Pricilla Handler wrote: Reason for CRM: Patient called requesting the following medications for refills: Carvedilol (COREG) 3.125 MG tablet and Rivaroxaban (XARELTO) 20 MG TABS tablet. Patient's preferred pharmacy is [facility] [address] [place] [address] [phone] (Phone)    [phone] (Fax).         Thank You!!!

## 2018-02-15 NOTE — Telephone Encounter (Signed)
Spoke with patients husband explained she would need an appointment for evaluation of symptoms in order to treat appropriately. He states he would check with patient to see if she wanted to come in and they will call back to schedule if she decided to come in. Requested refills are Rx'd by cardiology and it looks like she still has refills at her pharmacy. Advised patient husband to contact pharmacy for refills.

## 2018-02-15 NOTE — Telephone Encounter (Signed)
Copied from [place] [id number]. Topic: Inquiry >> Feb 15, 2018 11:20 AM Pricilla Handler wrote: Reason for CRM: Patient called requesting a Z-Pack from Dr. Raoul Pitch. Patient states that she is having congestion.        Thank You!!!

## 2018-02-20 ENCOUNTER — Other Ambulatory Visit: Payer: Self-pay | Admitting: Student

## 2018-02-20 NOTE — Telephone Encounter (Signed)
lvm needing a Rx for rivaroxaban (XARELTO) 20 MG TABS tablet [[id number]]

## 2018-02-20 NOTE — Telephone Encounter (Signed)
Pt has 6 refills left, just needs to call [facility] in [place]

## 2018-02-21 ENCOUNTER — Encounter: Payer: Self-pay | Admitting: Physical Medicine & Rehabilitation

## 2018-02-21 ENCOUNTER — Encounter
Payer: BLUE CROSS/BLUE SHIELD | Attending: Physical Medicine & Rehabilitation | Admitting: Physical Medicine & Rehabilitation

## 2018-02-21 ENCOUNTER — Other Ambulatory Visit: Payer: Self-pay | Admitting: Physical Medicine & Rehabilitation

## 2018-02-21 VITALS — BP 127/73 | HR 84 | Ht 61.0 in | Wt 115.0 lb

## 2018-02-21 DIAGNOSIS — Z951 Presence of aortocoronary bypass graft: Secondary | ICD-10-CM | POA: Diagnosis not present

## 2018-02-21 DIAGNOSIS — Z833 Family history of diabetes mellitus: Secondary | ICD-10-CM | POA: Insufficient documentation

## 2018-02-21 DIAGNOSIS — R269 Unspecified abnormalities of gait and mobility: Secondary | ICD-10-CM

## 2018-02-21 DIAGNOSIS — Z888 Allergy status to other drugs, medicaments and biological substances status: Secondary | ICD-10-CM | POA: Insufficient documentation

## 2018-02-21 DIAGNOSIS — Z8249 Family history of ischemic heart disease and other diseases of the circulatory system: Secondary | ICD-10-CM | POA: Diagnosis not present

## 2018-02-21 DIAGNOSIS — R1310 Dysphagia, unspecified: Secondary | ICD-10-CM | POA: Insufficient documentation

## 2018-02-21 DIAGNOSIS — Z87891 Personal history of nicotine dependence: Secondary | ICD-10-CM | POA: Insufficient documentation

## 2018-02-21 DIAGNOSIS — I69391 Dysphagia following cerebral infarction: Secondary | ICD-10-CM | POA: Diagnosis not present

## 2018-02-21 DIAGNOSIS — I63512 Cerebral infarction due to unspecified occlusion or stenosis of left middle cerebral artery: Secondary | ICD-10-CM

## 2018-02-21 DIAGNOSIS — I69351 Hemiplegia and hemiparesis following cerebral infarction affecting right dominant side: Secondary | ICD-10-CM | POA: Insufficient documentation

## 2018-02-21 DIAGNOSIS — M170 Bilateral primary osteoarthritis of knee: Secondary | ICD-10-CM

## 2018-02-21 DIAGNOSIS — G8110 Spastic hemiplegia affecting unspecified side: Secondary | ICD-10-CM

## 2018-02-21 DIAGNOSIS — K5900 Constipation, unspecified: Secondary | ICD-10-CM | POA: Diagnosis not present

## 2018-02-21 NOTE — Progress Notes (Addendum)
Subjective:    Patient ID: [name], female    DOB: 11/02/1960, 57 y.o.   MRN: [id number]  HPI 57 year old right-handed female with unremarkable past history except tobacco abuse, on no prescription medication, left MCA infarct status post TPA and thrombectomy presents for follow up for cardiac debility s/p CABG and stroke.    Last clinic visit 12/22/17.  Husband present, provides majority of history. Since that time, she has completed therapies for stroke, but starts cardiac rehab next week. She has not followed up for vascular study. Denies falls. She did not have energy for some time, but she believes she is improving.  She had steroid injections in bilateral knees by Ortho.  Pain Inventory Average Pain 5 Pain Right Now 5 My pain is aching  In the last 24 hours, has pain interfered with the following? General activity 6 Relation with others 6 Enjoyment of life 6 What TIME of day is your pain at its worst? varies Sleep (in general) Poor  Pain is worse with: unsure Pain improves with: medication Relief from Meds: 6  Mobility walk with assistance use a cane ability to climb steps?  no do you drive?  no Do you have any goals in this area?  yes  Function retired  Neuro/Psych trouble walking  Prior Studies Any changes since last visit?  no  Physicians involved in your care Any changes since last visit?  no   Family History  Problem Relation Age of Onset  . Heart attack Father   . Hypertension Mother   . Hyperlipidemia Mother   . Diabetes type II Mother   . Hypertension Brother    Social History   Socioeconomic History  . Marital status: Married    Spouse name: Not on file  . Number of children: 2  . Years of education: Not on file  . Highest education level: Not on file  Occupational History  . Not on file  Social Needs  . Financial resource strain: Not on file  . Food insecurity:    Worry: Not on file    Inability: Not on file  .  Transportation needs:    Medical: Not on file    Non-medical: Not on file  Tobacco Use  . Smoking status: Former Smoker    Packs/day: 0.12    Years: 37.00    Pack years: 4.44    Types: Cigarettes    Last attempt to quit: 08/21/2017    Years since quitting: 0.5  . Smokeless tobacco: Never Used  Substance and Sexual Activity  . Alcohol use: No    Frequency: Never  . Drug use: No  . Sexual activity: Not on file  Lifestyle  . Physical activity:    Days per week: Not on file    Minutes per session: Not on file  . Stress: Not on file  Relationships  . Social connections:    Talks on phone: Patient refused    Gets together: Patient refused    Attends religious service: Patient refused    Active member of club or organization: Patient refused    Attends meetings of clubs or organizations: Patient refused    Relationship status: Patient refused  Other Topics Concern  . Not on file  Social History Narrative   Married. 2 children.    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    Walks with cane or wheelchair.    Former smoker.    Drinks caffeine.    Smoke alarm in the home.  Firearms in the home.    Wears her seatbelt.    Feels safe In her relationships.    Past Surgical History:  Procedure Laterality Date  . CORONARY ARTERY BYPASS GRAFT N/A 11/23/2017   Procedure: CORONARY ARTERY BYPASS GRAFTING (CABG) x 2, ON PUMP, USING LEFT INTERNAL MAMMARY ARTERY AND RIGHT GREATER SAPHENOUS VEIN HARVESTED ENDOSCOPICALLY;  Surgeon: Gaye Pollack, MD;  Location: [place];  Service: Open Heart Surgery;  Laterality: N/A;  . CRYOABLATION     cervical  . IR ANGIO INTRA EXTRACRAN SEL COM CAROTID INNOMINATE UNI R MOD SED  08/21/2017  . IR ANGIO VERTEBRAL SEL SUBCLAVIAN INNOMINATE UNI R MOD SED  08/21/2017  . IR PERCUTANEOUS ART THROMBECTOMY/INFUSION INTRACRANIAL INC DIAG ANGIO  08/21/2017  . IR RADIOLOGIST EVAL & MGMT  10/30/2017  . LOOP RECORDER INSERTION N/A 08/28/2017   Procedure: LOOP RECORDER  INSERTION;  Surgeon: Constance Haw, MD;  Location: [place] [facility];  Service: Cardiovascular;  Laterality: N/A;  . RADIOLOGY WITH ANESTHESIA N/A 08/21/2017   Procedure: RADIOLOGY WITH ANESTHESIA;  Surgeon: Luanne Bras, MD;  Location: [place];  Service: Radiology;  Laterality: N/A;  . RIGHT/LEFT HEART CATH AND CORONARY ANGIOGRAPHY N/A 11/20/2017   Procedure: RIGHT/LEFT HEART CATH AND CORONARY ANGIOGRAPHY;  Surgeon: Jettie Booze, MD;  Location: [place] [facility];  Service: Cardiovascular;  Laterality: N/A;  . TEE WITHOUT CARDIOVERSION N/A 08/28/2017   Procedure: TRANSESOPHAGEAL ECHOCARDIOGRAM (TEE);  Surgeon: Fay Records, MD;  Location: [place];  Service: Cardiovascular;  Laterality: N/A;  . TEE WITHOUT CARDIOVERSION N/A 11/23/2017   Procedure: TRANSESOPHAGEAL ECHOCARDIOGRAM (TEE);  Surgeon: Gaye Pollack, MD;  Location: [place];  Service: Open Heart Surgery;  Laterality: N/A;  . TUBAL LIGATION     Past Medical History:  Diagnosis Date  . Acute and chronic respiratory failure with hypoxia ([place]) 10/2017   w/nstemi  . Arthritis    "knees" (11/16/2017)  . Aspiration pneumonia ([place]) 10/2017  . CAD (coronary artery disease)    a. s/p NSTEMI in 10/2017 with cath showing LM disease --> s/p CABG on 11/23/2017 with LIMA-LAD and SVG-OM.   [name] Hay fever   . Ischemic cardiomyopathy    a. EF 35-40% by echo in 10/2017  . NSTEMI (non-ST elevated myocardial infarction) ([place])   . Pneumonia 11/16/2017  . Stroke ([place]) 08/23/2017   Left MCA infarct status post TPA and thrombectomy   BP 127/73   Pulse 84   Ht 5\' 1"  (1.549 m)   Wt 115 lb (52.2 kg)   LMP  (LMP Unknown)   SpO2 93%   BMI 21.73 kg/m   Opioid Risk Score:   Fall Risk Score:  `1  Depression screen PHQ 2/9  Depression screen [facility] 2/9 11/10/2017 11/10/2017 10/13/2017 10/05/2017 10/05/2017  Decreased Interest 0 0 0 1 0  Down, Depressed, Hopeless 0 0 0 0 0  PHQ - 2 Score 0 0 0 1 0  Altered sleeping 0 - - 0 -  Tired,  decreased energy 0 - - 2 -  Change in appetite 0 - - 0 -  Feeling bad or failure about yourself  0 - - 0 -  Trouble concentrating 0 - - 1 -  Moving slowly or fidgety/restless 0 - - 1 -  Suicidal thoughts 0 - - 0 -  PHQ-9 Score 0 - - 5 -  Difficult doing work/chores Not difficult at all - - Not difficult at all -    Review of Systems  HENT:  Negative.   Eyes: Negative.   Respiratory: Negative.   Cardiovascular: Positive for leg swelling.  Gastrointestinal: Negative.   Endocrine: Negative.   Genitourinary: Negative.   Musculoskeletal: Positive for arthralgias, gait problem and myalgias.  Skin: Negative.   Allergic/Immunologic: Negative.   Neurological: Positive for weakness and numbness.  Hematological: Negative.   Psychiatric/Behavioral: Negative.   All other systems reviewed and are negative.     Objective:   Physical Exam Constitutional: She appears well-developed and well-nourished.  HENT: Atraumatic. Normocephalic. Eyes: EOMI. No discharge.  Cardiovascular: RRR. No JVD   Respiratory: Normal effort. Clear. GI: Bowel sounds are normal. Non-distended Musculoskeletal: No edema or tenderness in extremities. Neurological: She is alert and oriented.  Motor: Right upper extremity: 2/5 shoulder abduction, 2+/5 elbow flex/ext, 2+/5 hand grip  Right lower extremity: Hip flexion 4-/5, knee extension 4-/5, ADF/PF 4-/5  MAS: elbow flexors 1/4, wrist ext 1+/4, finger flexors 2/4, ankle plantor flexors 1/4 Skin: Warm and dry.  Intact.  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  1. 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 Status post loop recorder  Cont therapies  Encouraged  compliance with PRAFO/WHO again  Cont Fluoxetine   Pt to follow up with Vascular in a few months for arterial study that was not performed, reminded to follow up  2. Spastic hemiplegia  Will increase Baclofen to 10 TID  Would like hold off on Botox at this time  3. CAD S/P CABG x2 11/24/2017.  Follow up with Cards  Cont Xarelto by Cards   4. Neurologic gait abnormality  Cont therapies  Cont quad cane  5. Pain   From Sternal incision  Improving  Encouraged OTC Lidoderm patch, reminded  6. Bilateral knee OA  Pt would like to follow up here for Zilretta injections, will schedule in 1 month  >25 minutes spent with patient, with >20 minutes in counseling regarding stroke sequela

## 2018-02-22 ENCOUNTER — Other Ambulatory Visit: Payer: Self-pay | Admitting: Physical Medicine & Rehabilitation

## 2018-02-24 DIAGNOSIS — I639 Cerebral infarction, unspecified: Secondary | ICD-10-CM | POA: Diagnosis not present

## 2018-02-27 ENCOUNTER — Ambulatory Visit (HOSPITAL_COMMUNITY): Payer: BLUE CROSS/BLUE SHIELD | Attending: Student

## 2018-02-27 ENCOUNTER — Telehealth (HOSPITAL_COMMUNITY): Payer: Self-pay

## 2018-02-27 NOTE — Telephone Encounter (Signed)
Cardiac Rehab Medication Review by a Pharmacist  Does the patient  feel that his/her medications are working for him/her?  yes  Has the patient been experiencing any side effects to the medications prescribed?  Yes, has a rash on right arm. However, she also wears a cuff on this arm for rehab and thinks it could also be related to that. As it resolves two-three days after not wearing the cuff, likely not drug related.  Does the patient measure his/her own blood pressure or blood glucose at home?  Yes, SBP runs in 120's and DBP in the 70's  Does the patient have any problems obtaining medications due to transportation or finances?   Yes, Xarelto is posing some cost issues that she is concerned about. She does state that she is working with her doctor on where to get her medication.  Understanding of regimen: good Understanding of indications: good Potential of compliance: good    Pharmacist comments: [name] is a 57 y/o F who I spoke with regarding her upcoming cardiac rehab appointment. Her medications have been adjusted to match what she is currently taking and how she is actually taking them. Denies any concerns with the medications other than what is detailed above.    Patterson Hammersmith PharmD PGY1 Pharmacy Practice Resident 02/27/2018 4:58 PM Phone: [phone]

## 2018-02-28 LAB — CUP PACEART REMOTE DEVICE CHECK
Date Time Interrogation Session: 20190511200801
MDC IDC PG IMPLANT DT: 20181203

## 2018-03-01 ENCOUNTER — Encounter (HOSPITAL_COMMUNITY)
Admission: RE | Admit: 2018-03-01 | Discharge: 2018-03-01 | Disposition: A | Discharge status: Home / Self Care | Payer: BLUE CROSS/BLUE SHIELD | Source: Ambulatory Visit | Attending: Cardiology | Admitting: Cardiology

## 2018-03-01 ENCOUNTER — Ambulatory Visit (HOSPITAL_COMMUNITY): Payer: BLUE CROSS/BLUE SHIELD

## 2018-03-01 NOTE — Progress Notes (Signed)
[name] is here for orientation for phase 2 cardiac rehab.  Completed nustep test with 410 steps in 6 minutes using no arms. Patient had right sided arm brace on and was able to use her quad cane. Due to patient current state of mobility and right sided weakness from her previous CVA, group exercise will not be beneficial for [name] at this time. Vital  signs were stable. Telemetry rhythm Sinus with an occasional PVC. We tried to use the recumbent bike but [name] was not able to keep her feet in the straps. Will get input for outpatient rehab to see if there are any additional resources for future therapy for [name]. Patient and husband state understanding. Dr McDowell's office was called and notified.Barnet Pall, RN,BSN 03/01/2018 4:12 PM

## 2018-03-07 ENCOUNTER — Inpatient Hospital Stay (HOSPITAL_COMMUNITY): Admission: RE | Admit: 2018-03-07 | Payer: BLUE CROSS/BLUE SHIELD | Source: Ambulatory Visit

## 2018-03-08 ENCOUNTER — Ambulatory Visit (INDEPENDENT_AMBULATORY_CARE_PROVIDER_SITE_OTHER): Payer: BLUE CROSS/BLUE SHIELD | Admitting: *Deleted

## 2018-03-08 DIAGNOSIS — Z8673 Personal history of transient ischemic attack (TIA), and cerebral infarction without residual deficits: Secondary | ICD-10-CM | POA: Diagnosis not present

## 2018-03-09 ENCOUNTER — Ambulatory Visit (HOSPITAL_COMMUNITY): Payer: BLUE CROSS/BLUE SHIELD

## 2018-03-09 NOTE — Progress Notes (Signed)
[facility] Summary Report / Loop Recorder 

## 2018-03-12 ENCOUNTER — Ambulatory Visit (HOSPITAL_COMMUNITY): Payer: BLUE CROSS/BLUE SHIELD

## 2018-03-13 ENCOUNTER — Encounter: Payer: Self-pay | Admitting: Family Medicine

## 2018-03-13 ENCOUNTER — Ambulatory Visit: Payer: BLUE CROSS/BLUE SHIELD | Admitting: Family Medicine

## 2018-03-13 VITALS — BP 112/68 | Temp 98.0°F | Resp 20 | Ht 60.0 in | Wt 114.0 lb

## 2018-03-13 DIAGNOSIS — R21 Rash and other nonspecific skin eruption: Secondary | ICD-10-CM | POA: Diagnosis not present

## 2018-03-13 DIAGNOSIS — L508 Other urticaria: Secondary | ICD-10-CM

## 2018-03-13 MED ORDER — RANITIDINE HCL 150 MG PO TABS: 150.0000 mg | ORAL_TABLET | Freq: Two times a day (BID) | ORAL | 1 refills | Status: DC

## 2018-03-13 MED ORDER — LEVOCETIRIZINE DIHYDROCHLORIDE 5 MG PO TABS: 5.0000 mg | ORAL_TABLET | Freq: Every evening | ORAL | 1 refills | Status: DC

## 2018-03-13 MED ORDER — TRIAMCINOLONE ACETONIDE 0.1 % EX CREA: 1.0000 "application " | TOPICAL_CREAM | Freq: Two times a day (BID) | CUTANEOUS | 0 refills | Status: DC

## 2018-03-13 NOTE — Progress Notes (Signed)
[name] , 1960/11/06, 57 y.o., female MRN: [id number] Patient Care Team    Relationship Specialty Notifications Start End  [name], DO PCP - General Family Medicine  10/13/17   Satira Sark, MD PCP - Cardiology Cardiology Admissions 12/15/17   Constance Haw, MD Consulting Physician Cardiology  10/13/17   Rosalin Hawking, MD Consulting Physician Neurology  10/13/17   Jamse Arn, MD Consulting Physician Physical Medicine and Rehabilitation  10/13/17   Luanne Bras, MD Consulting Physician Interventional Radiology  10/13/17     Chief Complaint  Patient presents with  . Rash    urticaria     Subjective: Pt presents for an OV with complaints of rash/hives. They are two separate issues.  Hives: She has had hives a few times this year. She thought they were secondary to 2 different meds, both of which had been discontinued without resolution of rash. It was felt this is more of potential anxiety cause of her chronic hives. She is taking the vistaril, but she feels this just makes her tired. She is on lexpro low dose as well. Her hives continue to come daily and are large welts across her body that are severely  Pruritic. She has aslo used OTC creams for itching.   Rash: she presents today with a 4 day h/o of new rash right forearm. It is itchy red and scaly and has been present (not waxing/waning). It is at the location of her arm brace only. She uses cotton stocking under her arm band. She does not get her arm band wet.   Depression screen [facility] 2/9 11/10/2017 11/10/2017 10/13/2017 10/05/2017 10/05/2017  Decreased Interest 0 0 0 1 0  Down, Depressed, Hopeless 0 0 0 0 0  PHQ - 2 Score 0 0 0 1 0  Altered sleeping 0 - - 0 -  Tired, decreased energy 0 - - 2 -  Change in appetite 0 - - 0 -  Feeling bad or failure about yourself  0 - - 0 -  Trouble concentrating 0 - - 1 -  Moving slowly or fidgety/restless 0 - - 1 -  Suicidal thoughts 0 - - 0 -  PHQ-9 Score 0 - - 5  -  Difficult doing work/chores Not difficult at all - - Not difficult at all -    Allergies  Allergen Reactions  . Lopressor [Metoprolol Tartrate] Swelling    Angioedema  . Lipitor [Atorvastatin] Diarrhea   Social History   Tobacco Use  . Smoking status: Former Smoker    Packs/day: 0.12    Years: 37.00    Pack years: 4.44    Types: Cigarettes    Last attempt to quit: 08/21/2017    Years since quitting: 0.5  . Smokeless tobacco: Never Used  Substance Use Topics  . Alcohol use: No    Frequency: Never   Past Medical History:  Diagnosis Date  . Acute and chronic respiratory failure with hypoxia ([place]) 10/2017   w/nstemi  . Arthritis    "knees" (11/16/2017)  . Aspiration pneumonia ([place]) 10/2017  . CAD (coronary artery disease)    a. s/p NSTEMI in 10/2017 with cath showing LM disease --> s/p CABG on 11/23/2017 with LIMA-LAD and SVG-OM.   [name] Hay fever   . Ischemic cardiomyopathy    a. EF 35-40% by echo in 10/2017  . NSTEMI (non-ST elevated myocardial infarction) ([place])   . Pneumonia 11/16/2017  . Stroke [facility]) 08/23/2017   Left MCA infarct  status post TPA and thrombectomy   Past Surgical History:  Procedure Laterality Date  . CORONARY ARTERY BYPASS GRAFT N/A 11/23/2017   Procedure: CORONARY ARTERY BYPASS GRAFTING (CABG) x 2, ON PUMP, USING LEFT INTERNAL MAMMARY ARTERY AND RIGHT GREATER SAPHENOUS VEIN HARVESTED ENDOSCOPICALLY;  Surgeon: Gaye Pollack, MD;  Location: [place];  Service: Open Heart Surgery;  Laterality: N/A;  . CRYOABLATION     cervical  . IR ANGIO INTRA EXTRACRAN SEL COM CAROTID INNOMINATE UNI R MOD SED  08/21/2017  . IR ANGIO VERTEBRAL SEL SUBCLAVIAN INNOMINATE UNI R MOD SED  08/21/2017  . IR PERCUTANEOUS ART THROMBECTOMY/INFUSION INTRACRANIAL INC DIAG ANGIO  08/21/2017  . IR RADIOLOGIST EVAL & MGMT  10/30/2017  . LOOP RECORDER INSERTION N/A 08/28/2017   Procedure: LOOP RECORDER INSERTION;  Surgeon: Constance Haw, MD;  Location: [place] [facility];  Service:  Cardiovascular;  Laterality: N/A;  . RADIOLOGY WITH ANESTHESIA N/A 08/21/2017   Procedure: RADIOLOGY WITH ANESTHESIA;  Surgeon: Luanne Bras, MD;  Location: [place];  Service: Radiology;  Laterality: N/A;  . RIGHT/LEFT HEART CATH AND CORONARY ANGIOGRAPHY N/A 11/20/2017   Procedure: RIGHT/LEFT HEART CATH AND CORONARY ANGIOGRAPHY;  Surgeon: Jettie Booze, MD;  Location: [place] [facility];  Service: Cardiovascular;  Laterality: N/A;  . TEE WITHOUT CARDIOVERSION N/A 08/28/2017   Procedure: TRANSESOPHAGEAL ECHOCARDIOGRAM (TEE);  Surgeon: Fay Records, MD;  Location: [place];  Service: Cardiovascular;  Laterality: N/A;  . TEE WITHOUT CARDIOVERSION N/A 11/23/2017   Procedure: TRANSESOPHAGEAL ECHOCARDIOGRAM (TEE);  Surgeon: Gaye Pollack, MD;  Location: [place];  Service: Open Heart Surgery;  Laterality: N/A;  . TUBAL LIGATION     Family History  Problem Relation Age of Onset  . Heart attack Father   . Hypertension Mother   . Hyperlipidemia Mother   . Diabetes type II Mother   . Hypertension Brother    Allergies as of 03/13/2018      Reactions   Lopressor [metoprolol Tartrate] Swelling   Angioedema   Lipitor [atorvastatin] Diarrhea      Medication List        Accurate as of 03/13/18  3:13 PM. Always use your most recent med list.          baclofen 10 MG tablet Commonly known as:  LIORESAL baclofen 10 mg tablet   carvedilol 3.125 MG tablet Commonly known as:  COREG Take 1 tablet (3.125 mg total) by mouth 2 (two) times daily with a meal.   cholecalciferol 1000 units tablet Commonly known as:  VITAMIN D Take 1,000 Units by mouth daily.   escitalopram 10 MG tablet Commonly known as:  LEXAPRO Take 1 tablet (10 mg total) by mouth daily.   famotidine 20 MG tablet Commonly known as:  PEPCID Take 20 mg by mouth 2 (two) times daily.   ferrous sulfate 325 (65 FE) MG tablet Take 325 mg by mouth daily with breakfast.   furosemide 20 MG tablet Commonly known as:   LASIX Take 1 tablet (20 mg total) by mouth daily.   hydrOXYzine 25 MG tablet Commonly known as:  ATARAX/VISTARIL Take 25 mg by mouth 3 (three) times daily as needed for itching.   Potassium Chloride ER 20 MEQ Tbcr Take 20 mEq by mouth daily.   rivaroxaban 20 MG Tabs tablet Commonly known as:  XARELTO Take 1 tablet (20 mg total) by mouth daily with supper.   rosuvastatin 20 MG tablet Commonly known as:  CRESTOR Take 1 tablet (20 mg total)  by mouth daily.   traMADol 50 MG tablet Commonly known as:  ULTRAM Take 1 tablet (50 mg total) by mouth every 12 (twelve) hours as needed for moderate pain.   vitamin B-12 100 MCG tablet Commonly known as:  CYANOCOBALAMIN Take 100 mcg by mouth daily.       All past medical history, surgical history, allergies, family history, immunizations andmedications were updated in the EMR today and reviewed under the history and medication portions of their EMR.     ROS: Negative, with the exception of above mentioned in HPI   Objective:  BP 112/68 (BP Location: Left Arm, Patient Position: Sitting, Cuff Size: Normal)   Temp 98 F (36.7 C)   Resp 20   Ht 5' (1.524 m)   Wt 114 lb (51.7 kg)   LMP  (LMP Unknown)   SpO2 98%   BMI 22.26 kg/m  Body mass index is 22.26 kg/m. Gen: Afebrile. No acute distress. Nontoxic in appearance, well developed, well nourished.  HENT: AT. [place].  MMM Eyes:Pupils Equal Round Reactive to light, Extraocular movements intact,  Conjunctiva without redness, discharge or icterus. Skin: no purpura or petechiae. Large raised red hives over truck, breast and buttocks. Red flat confluent scaly rash right forearm ~ 2 in distal to antecubital fossa to 1-2 in on back of hand.  Neuro: PERLA. EOMi. Alert. Oriented x3  Psych: Normal affect, dress and demeanor. Normal speech. Normal thought content and judgment.  No exam data present No results found. No results found for this or any previous visit (from the past 24  hour(s)).  Assessment/Plan: KAZUE CERRO is a 57 y.o. female present for OV for  Rash Kenalog cream. Switch out cotton stocking for another product (maybe the actual arm band also). This appears to be a dermatitis from an allergy.  Chronic urticaria Her hives are now chronic and present since hospitalization intermittently. She has had hives int he past as well.  - Continue lexapro 10 mg QD. - start xyzal QHS and zantac BID. Could consider adding doxepin.  - can consider derm or allergist if continues without relief. - F/U PRN  Reviewed expectations re: course of current medical issues.  Discussed self-management of symptoms.  Outlined signs and symptoms indicating need for more acute intervention.  Patient verbalized understanding and all questions were answered.  Patient received an After-Visit Summary.    No orders of the defined types were placed in this encounter.    Note is dictated utilizing voice recognition software. Although note has been proof read prior to signing, occasional typographical errors still can be missed. If any questions arise, please do not hesitate to call for verification.   electronically signed by:  Howard Pouch, DO  [place]

## 2018-03-13 NOTE — Patient Instructions (Addendum)
Hives: start xyzal 1 tab at night and zantac (ranitidine) every 12 hours.   Arm: called in a cream. Use twice a day.   These are two different types of rashes.    Hives Hives (urticaria) are itchy, red, swollen areas on your skin. Hives can show up on any part of your body, and they can vary in size. They can be as small as the tip of a pen or much larger. Hives often fade within 24 hours (acute hives). In other cases, new hives show up after old ones fade. This can continue for many days or weeks (chronic hives). Hives are caused by your body's reaction to an irritant or to something that you are allergic to (trigger). You can get hives right after being around a trigger or hours later. Hives do not spread from person to person (are not contagious). Hives may get worse if you scratch them, if you exercise, or if you have worries (emotional stress). Follow these instructions at home: Medicines  Take or apply over-the-counter and prescription medicines only as told by your doctor.  If you were prescribed an antibiotic medicine, use it as told by your doctor. Do not stop taking the antibiotic even if you start to feel better. Skin Care  Apply cool, wet cloths (cool compresses) to the itchy, red, swollen areas.  Do not scratch your skin. Do not rub your skin. General instructions  Do not take hot showers or baths. This can make itching worse.  Do not wear tight clothes.  Use sunscreen and wear clothing that covers your skin when you are outside.  Avoid any triggers that cause your hives. Keep a journal to help you keep track of what causes your hives. Write down: ? What medicines you take. ? What you eat and drink. ? What products you use on your skin.  Keep all follow-up visits as told by your doctor. This is important. Contact a doctor if:  Your symptoms are not better with medicine.  Your joints are painful or swollen. Get help right away if:  You have a fever.  You have  belly pain.  Your tongue or lips are swollen.  Your eyelids are swollen.  Your chest or throat feels tight.  You have trouble breathing or swallowing. These symptoms may be an emergency. Do not wait to see if the symptoms will go away. Get medical help right away. Call your local emergency services (911 in the [place] Do not drive yourself to the hospital. This information is not intended to replace advice given to you by your health care provider. Make sure you discuss any questions you have with your health care provider. Document Released: 06/21/2008 Document Revised: 02/18/2016 Document Reviewed: 07/01/2015 Elsevier Interactive Patient Education  2018 [employer].

## 2018-03-14 ENCOUNTER — Telehealth: Payer: Self-pay | Admitting: Cardiology

## 2018-03-14 ENCOUNTER — Encounter

## 2018-03-14 ENCOUNTER — Ambulatory Visit (HOSPITAL_COMMUNITY): Payer: BLUE CROSS/BLUE SHIELD

## 2018-03-14 NOTE — Telephone Encounter (Signed)
NEW MESSAGE ° ° ° °Patient calling the office for samples of medication: ° ° °1.  What medication and dosage are you requesting samples for? rivaroxaban (XARELTO) 20 MG TABS tablet ° °2.  Are you currently out of this medication? YES ° ° °

## 2018-03-14 NOTE — Telephone Encounter (Signed)
Called pt to inform her that the samples were for patients that were just starting the medication, but I would leave 2 weeks supply of samples of Xarelto 20 mg tablets and patient assistant forms for pt at the front desk, because pt is having problems getting medication because of the cost. I informed pt to fill out forms and bring back to [employer], LPN, to see if pt can get pt assistant for Xarelto. I advised pt that if she has any other problems, questions or concerns, to call the office. Pt verbalized understanding.

## 2018-03-15 ENCOUNTER — Emergency Department (HOSPITAL_COMMUNITY): Payer: BLUE CROSS/BLUE SHIELD

## 2018-03-15 ENCOUNTER — Telehealth: Payer: Self-pay | Admitting: Family Medicine

## 2018-03-15 ENCOUNTER — Other Ambulatory Visit: Payer: Self-pay

## 2018-03-15 ENCOUNTER — Ambulatory Visit
Admission: RE | Admit: 2018-03-15 | Discharge: 2018-03-15 | Disposition: A | Discharge status: Home / Self Care | Payer: BLUE CROSS/BLUE SHIELD | Source: Ambulatory Visit | Attending: Family Medicine | Admitting: Family Medicine

## 2018-03-15 ENCOUNTER — Emergency Department (HOSPITAL_COMMUNITY)
Admission: EM | Admit: 2018-03-15 | Discharge: 2018-03-15 | Disposition: A | Discharge status: Home / Self Care | Payer: BLUE CROSS/BLUE SHIELD | Attending: Emergency Medicine | Admitting: Emergency Medicine

## 2018-03-15 DIAGNOSIS — Z951 Presence of aortocoronary bypass graft: Secondary | ICD-10-CM | POA: Diagnosis not present

## 2018-03-15 DIAGNOSIS — Z7901 Long term (current) use of anticoagulants: Secondary | ICD-10-CM | POA: Insufficient documentation

## 2018-03-15 DIAGNOSIS — Z1231 Encounter for screening mammogram for malignant neoplasm of breast: Secondary | ICD-10-CM | POA: Diagnosis not present

## 2018-03-15 DIAGNOSIS — Z79899 Other long term (current) drug therapy: Secondary | ICD-10-CM | POA: Diagnosis not present

## 2018-03-15 DIAGNOSIS — J069 Acute upper respiratory infection, unspecified: Secondary | ICD-10-CM | POA: Diagnosis not present

## 2018-03-15 DIAGNOSIS — M8589 Other specified disorders of bone density and structure, multiple sites: Secondary | ICD-10-CM | POA: Diagnosis not present

## 2018-03-15 DIAGNOSIS — I2510 Atherosclerotic heart disease of native coronary artery without angina pectoris: Secondary | ICD-10-CM | POA: Diagnosis not present

## 2018-03-15 DIAGNOSIS — R05 Cough: Secondary | ICD-10-CM | POA: Diagnosis not present

## 2018-03-15 DIAGNOSIS — I252 Old myocardial infarction: Secondary | ICD-10-CM | POA: Diagnosis not present

## 2018-03-15 DIAGNOSIS — R000 Tachycardia, unspecified: Secondary | ICD-10-CM | POA: Diagnosis not present

## 2018-03-15 DIAGNOSIS — I110 Hypertensive heart disease with heart failure: Secondary | ICD-10-CM | POA: Diagnosis not present

## 2018-03-15 DIAGNOSIS — L509 Urticaria, unspecified: Secondary | ICD-10-CM | POA: Insufficient documentation

## 2018-03-15 DIAGNOSIS — Z87891 Personal history of nicotine dependence: Secondary | ICD-10-CM | POA: Insufficient documentation

## 2018-03-15 DIAGNOSIS — Z8673 Personal history of transient ischemic attack (TIA), and cerebral infarction without residual deficits: Secondary | ICD-10-CM | POA: Insufficient documentation

## 2018-03-15 DIAGNOSIS — N390 Urinary tract infection, site not specified: Secondary | ICD-10-CM | POA: Insufficient documentation

## 2018-03-15 DIAGNOSIS — R0602 Shortness of breath: Secondary | ICD-10-CM | POA: Diagnosis not present

## 2018-03-15 DIAGNOSIS — I5042 Chronic combined systolic (congestive) and diastolic (congestive) heart failure: Secondary | ICD-10-CM | POA: Diagnosis not present

## 2018-03-15 DIAGNOSIS — E2839 Other primary ovarian failure: Secondary | ICD-10-CM

## 2018-03-15 DIAGNOSIS — Z780 Asymptomatic menopausal state: Secondary | ICD-10-CM | POA: Diagnosis not present

## 2018-03-15 DIAGNOSIS — Z1239 Encounter for other screening for malignant neoplasm of breast: Secondary | ICD-10-CM

## 2018-03-15 LAB — CBC
HEMATOCRIT: 47.5 % — AB (ref 36.0–46.0)
Hemoglobin: 14.5 g/dL (ref 12.0–15.0)
MCH: 27.6 pg (ref 26.0–34.0)
MCHC: 30.5 g/dL (ref 30.0–36.0)
MCV: 90.5 fL (ref 78.0–100.0)
Platelets: 364 10*3/uL (ref 150–400)
RBC: 5.25 MIL/uL — AB (ref 3.87–5.11)
RDW: 22.5 % — ABNORMAL HIGH (ref 11.5–15.5)
WBC: 8.7 10*3/uL (ref 4.0–10.5)

## 2018-03-15 LAB — BASIC METABOLIC PANEL
Anion gap: 9 (ref 5–15)
BUN: 13 mg/dL (ref 6–20)
CALCIUM: 9 mg/dL (ref 8.9–10.3)
CO2: 27 mmol/L (ref 22–32)
Chloride: 104 mmol/L (ref 101–111)
Creatinine, Ser: 0.77 mg/dL (ref 0.44–1.00)
GLUCOSE: 84 mg/dL (ref 65–99)
POTASSIUM: 3.8 mmol/L (ref 3.5–5.1)
Sodium: 140 mmol/L (ref 135–145)

## 2018-03-15 LAB — I-STAT TROPONIN, ED: TROPONIN I, POC: 0.02 ng/mL (ref 0.00–0.08)

## 2018-03-15 MED ORDER — GUAIFENESIN ER 600 MG PO TB12: 600.0000 mg | ORAL_TABLET | Freq: Two times a day (BID) | ORAL | 0 refills | Status: DC

## 2018-03-15 MED ORDER — PREDNISONE 20 MG PO TABS
50.0000 mg | ORAL_TABLET | Freq: Once | ORAL | Status: AC
  Administered 2018-03-15: 50 mg via ORAL
  Filled 2018-03-15: qty 3

## 2018-03-15 MED ORDER — BENZONATATE 100 MG PO CAPS: 100.0000 mg | ORAL_CAPSULE | Freq: Three times a day (TID) | ORAL | 0 refills | Status: DC

## 2018-03-15 MED ORDER — PREDNISONE 50 MG PO TABS: 50.0000 mg | ORAL_TABLET | Freq: Every day | ORAL | 0 refills | Status: DC

## 2018-03-15 NOTE — ED Triage Notes (Signed)
Patient c/o SOB, congestion, head/face pain, and generalized rash. Patient is concerned that she may have PNA.

## 2018-03-15 NOTE — Discharge Instructions (Addendum)
Take the medications as prescribed, return as needed for worsening symptoms

## 2018-03-15 NOTE — ED Provider Notes (Signed)
Patient placed in Quick Look pathway, seen and evaluated   Chief Complaint: cough+ sob, rash  HPI:   Complicated PMH, concerned for CAP, no fevers, coughstarted today, Malaise sinsu pressure, nasal drainage  ROS: Cougjh, itching (one)  Physical Exam:   Gen: No distress  Neuro: Awake and Alert  Skin: Warm    Focused Exam:  Cough, no wheezing   Initiation of care has begun. The patient has been counseled on the process, plan, and necessity for staying for the completion/evaluation, and the remainder of the medical screening examination    Ned Grace 03/15/18 2004    Dorie Rank, MD 03/18/18 [phone]

## 2018-03-15 NOTE — ED Provider Notes (Signed)
[facility] EMERGENCY DEPARTMENT Provider Note   CSN: [id number] Arrival date & time: 03/15/18  1929     History   Chief Complaint Chief Complaint  Patient presents with  . Shortness of Breath    HPI [name] is a 57 y.o. female.  HPI Pt started having cough and congestion a few days ago.  She saw her doctor but did not mention it the other day.  The cough has been increasing and she was concerned about developing pna.  SHe has nasal congestion.  SOme mild sore throat.  No fever.   No vomiting or diarrha. Past Medical History:  Diagnosis Date  . Acute and chronic respiratory failure with hypoxia ([place]) 10/2017   w/nstemi  . Arthritis    "knees" (11/16/2017)  . Aspiration pneumonia ([place]) 10/2017  . CAD (coronary artery disease)    a. s/p NSTEMI in 10/2017 with cath showing LM disease --> s/p CABG on 11/23/2017 with LIMA-LAD and SVG-OM.   [name] Hay fever   . Ischemic cardiomyopathy    a. EF 35-40% by echo in 10/2017  . NSTEMI (non-ST elevated myocardial infarction) ([place])   . Pneumonia 11/16/2017  . Stroke [facility]) 08/23/2017   Left MCA infarct status post TPA and thrombectomy    Patient Active Problem List   Diagnosis Date Noted  . Atherosclerosis of aorta ([place]) 02/02/2018  . Paroxysmal atrial fibrillation ([place]) 12/19/2017  . Chronic combined systolic and diastolic CHF (congestive heart failure) ([place]) 12/16/2017  . Benign essential HTN   . Supplemental oxygen dependent   . 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 Coronary artery disease 11/23/2017  . NSTEMI (non-ST elevated myocardial infarction) ([place]) 11/16/2017  . Former smoker 10/13/2017  . 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 Cerebrovascular accident (CVA) due to occlusion of left middle cerebral artery ([place])   . Right hemiplegia ([place])   . Dysphagia, post-stroke   . Hyperlipidemia     Past Surgical History:  Procedure Laterality Date  . CORONARY ARTERY BYPASS GRAFT N/A 11/23/2017   Procedure: CORONARY ARTERY BYPASS GRAFTING (CABG) x 2, ON PUMP, USING LEFT INTERNAL MAMMARY ARTERY AND RIGHT GREATER SAPHENOUS VEIN HARVESTED ENDOSCOPICALLY;  Surgeon: Gaye Pollack, MD;  Location: [facility];  Service: Open Heart Surgery;  Laterality: N/A;  . CRYOABLATION     cervical  . IR ANGIO INTRA EXTRACRAN SEL COM CAROTID INNOMINATE UNI R MOD SED  08/21/2017  . IR ANGIO VERTEBRAL SEL SUBCLAVIAN INNOMINATE UNI R MOD SED  08/21/2017  . IR PERCUTANEOUS ART THROMBECTOMY/INFUSION INTRACRANIAL INC DIAG ANGIO  08/21/2017  . IR RADIOLOGIST EVAL & MGMT  10/30/2017  . LOOP RECORDER INSERTION N/A 08/28/2017   Procedure: LOOP RECORDER INSERTION;  Surgeon: Constance Haw, MD;  Location: [facility] CV LAB;  Service: Cardiovascular;  Laterality: N/A;  . RADIOLOGY WITH ANESTHESIA N/A 08/21/2017   Procedure: RADIOLOGY WITH ANESTHESIA;  Surgeon: Luanne Bras, MD;  Location: [facility];  Service: Radiology;  Laterality: N/A;  . RIGHT/LEFT HEART CATH AND CORONARY ANGIOGRAPHY N/A 11/20/2017   Procedure: RIGHT/LEFT HEART CATH AND CORONARY ANGIOGRAPHY;  Surgeon: Jettie Booze, MD;  Location: [facility];  Service: Cardiovascular;  Laterality: N/A;  . TEE WITHOUT CARDIOVERSION N/A 08/28/2017   Procedure: TRANSESOPHAGEAL ECHOCARDIOGRAM (TEE);  Surgeon: Fay Records, MD;  Location: [facility];  Service: Cardiovascular;  Laterality: N/A;  . TEE WITHOUT CARDIOVERSION N/A 11/23/2017   Procedure: TRANSESOPHAGEAL ECHOCARDIOGRAM (TEE);  Surgeon: Gaye Pollack, MD;  Location:  [facility] OR;  Service: Open Heart Surgery;  Laterality: N/A;  . TUBAL LIGATION       OB History    Gravida  2   Para  2   Term      Preterm      AB      Living  2     SAB      TAB      Ectopic      Multiple      Live Births               Home Medications    Prior to Admission medications   Medication Sig Start Date End Date Taking? Authorizing Provider  baclofen (LIORESAL) 10 MG tablet baclofen 10 mg  tablet    [provider]  carvedilol (COREG) 3.125 MG tablet Take 1 tablet (3.125 mg total) by mouth 2 (two) times daily with a meal. Patient taking differently: Take 3.125 mg by mouth 2 (two) times daily with a meal.  02/05/18   Strader, [place] M, PA-C  cholecalciferol (VITAMIN D) 1000 units tablet Take 1,000 Units by mouth daily.    [provider]  escitalopram (LEXAPRO) 10 MG tablet Take 1 tablet (10 mg total) by mouth daily. 12/08/17   Angiulli, Lavon Paganini, PA-C  famotidine (PEPCID) 20 MG tablet Take 20 mg by mouth 2 (two) times daily.    [provider]  ferrous sulfate 325 (65 FE) MG tablet Take 325 mg by mouth daily with breakfast.    [provider]  furosemide (LASIX) 20 MG tablet Take 1 tablet (20 mg total) by mouth daily. 12/18/17   [name], [name], DO  levocetirizine (XYZAL) 5 MG tablet Take 1 tablet (5 mg total) by mouth every evening. 03/13/18   [name], [name], DO  potassium chloride 20 MEQ TBCR Take 20 mEq by mouth daily. 01/02/18   [name], [name], DO  ranitidine (ZANTAC) 150 MG tablet Take 1 tablet (150 mg total) by mouth 2 (two) times daily. 03/13/18   [name], [name], DO  rivaroxaban (XARELTO) 20 MG TABS tablet Take 1 tablet (20 mg total) by mouth daily with supper. 12/22/17   Camnitz, Ocie Doyne, MD  rosuvastatin (CRESTOR) 20 MG tablet Take 1 tablet (20 mg total) by mouth daily. 10/13/17   [name], [name], DO  traMADol (ULTRAM) 50 MG tablet Take 1 tablet (50 mg total) by mouth every 12 (twelve) hours as needed for moderate pain. 01/08/18   [name], [name], DO  triamcinolone cream (KENALOG) 0.1 % Apply 1 application topically 2 (two) times daily. 03/13/18   [name], [name], DO  vitamin B-12 (CYANOCOBALAMIN) 100 MCG tablet Take 100 mcg by mouth daily.    [provider]    Family History Family History  Problem Relation Age of Onset  . Heart attack Father   . Hypertension Mother   . Hyperlipidemia Mother   . Diabetes type II Mother   .  Hypertension Brother   . Breast cancer Cousin 70    Social History Social History   Tobacco Use  . Smoking status: Former Smoker    Packs/day: 0.12    Years: 37.00    Pack years: 4.44    Types: Cigarettes    Last attempt to quit: 08/21/2017    Years since quitting: 0.5  . Smokeless tobacco: Never Used  Substance Use Topics  . Alcohol use: No    Frequency: Never  . Drug use: No  Allergies   Lopressor [metoprolol tartrate] and Lipitor [atorvastatin]   Review of Systems Review of Systems  All other systems reviewed and are negative.    Physical Exam Updated Vital Signs BP (!) 124/103 (BP Location: Left Arm)   Pulse (!) 114   Temp 99.8 F (37.7 C) (Oral)   Resp (!) 22   Ht 1.524 m (5')   Wt 51.7 kg (114 lb)   LMP  (LMP Unknown)   SpO2 93%   BMI 22.26 kg/m   Physical Exam  Constitutional: She appears well-developed and well-nourished. No distress.  HENT:  Head: Normocephalic and atraumatic.  Right Ear: External ear normal.  Left Ear: External ear normal.  Eyes: Conjunctivae are normal. Right eye exhibits no discharge. Left eye exhibits no discharge. No scleral icterus.  Neck: Neck supple. No tracheal deviation present.  Cardiovascular: Normal rate, regular rhythm and intact distal pulses.  Pulmonary/Chest: Effort normal and breath sounds normal. No stridor. No respiratory distress. She has no wheezes. She has no rales.  Abdominal: Soft. Bowel sounds are normal. She exhibits no distension. There is no tenderness. There is no rebound and no guarding.  Musculoskeletal: She exhibits no edema or tenderness.  Neurological: She is alert. She has normal strength. No cranial nerve deficit (no facial droop, extraocular movements intact, no slurred speech) or sensory deficit. She exhibits normal muscle tone. She displays no seizure activity. Coordination normal.  Skin: Skin is warm and dry. No rash noted.  Psychiatric: She has a normal mood and affect.  Nursing note  and vitals reviewed.    ED Treatments / Results  Labs (all labs ordered are listed, but only abnormal results are displayed) Labs Reviewed  CBC - Abnormal; Notable for the following components:      Result Value   RBC 5.25 (*)    HCT 47.5 (*)    RDW 22.5 (*)    All other components within normal limits  BASIC METABOLIC PANEL  I-STAT TROPONIN, ED    EKG EKG Interpretation  Date/Time:  Thursday March 15 2018 20:16:01 EDT Ventricular Rate:  109 PR Interval:  148 QRS Duration: 84 QT Interval:  336 QTC Calculation: 452 R Axis:   68 Text Interpretation:  Sinus tachycardia Possible Left atrial enlargement Cannot rule out Anterior infarct , age undetermined Abnormal ECG Confirmed by Thayer Jew [phone]) on 03/16/2018 2:16:19 PM   Radiology Dg Chest 2 View  Result Date: 03/15/2018 CLINICAL DATA:  Initial evaluation for acute shortness of breath. EXAM: CHEST - 2 VIEW COMPARISON:  Prior radiograph from 12/27/2017 FINDINGS: Median sternotomy wires underlying CABG markers. Loop recorder in place. Mild cardiomegaly, stable. Mediastinal silhouette normal. Lungs normally inflated. No focal infiltrates. No pulmonary edema or pleural effusion. No pneumothorax. No acute osseus abnormality. Surgical clips overlie the upper right chest. IMPRESSION: 1. No active cardiopulmonary disease. 2. Stable cardiomegaly without pulmonary edema. Electronically Signed   By: Jeannine Boga M.D.   On: 03/15/2018 20:55   Dg Bone Density  Result Date: 03/15/2018 EXAM: DUAL X-RAY ABSORPTIOMETRY (DXA) FOR BONE MINERAL DENSITY IMPRESSION: Referring Physician:  [name] [name] PATIENT: Name: [name], [name] Patient ID: [id number] Birth Date: [birth year] Height: 60.0 in. Sex: Female Measured: 03/15/2018 Weight: 114.0 lbs. Indications: Caucasian, Estrogen Deficient, Low Calcium Intake (269.3), Postmenopausal Fractures: None Treatments: Vitamin D (E933.5) ASSESSMENT: The BMD measured at Femur Total Right is 0.786  g/cm2 with a T-score of -1.8. This patient is considered OSTEOPENIC according to [facility]) criteria. The scan quality is  good. L-3 was excluded due to degenerative changes. Site Region Measured Date Measured Age YA T-score BMD Significant CHANGE DualFemur Total Right 03/15/2018    57.8         -1.8    0.786 g/cm2 AP Spine  L1-L4 (L3)  2/15/2018    57.7         -1.1    1.042 g/cm2 World Health Organization [facility]) criteria for post-menopausal, Caucasian Women: Normal       T-score at or above -1 SD Osteopenia   T-score between -1 and -2.5 SD Osteoporosis T-score at or below -2.5 SD RECOMMENDATION: 1. All patients should optimize calcium and vitamin D intake. 2. Consider FDA approved medical therapies in postmenopausal women and men aged 54 years and older, based on the following: a. A hip or vertebral (clinical or morphometric) fracture b. T- score < or = -2.5 at the femoral neck or spine after appropriate evaluation to exclude secondary causes c. Low bone mass (T-score between -1.0 and -2.5 at the femoral neck or spine) and a 10 year probability of a hip fracture > or = 3% or a 10 year probability of a major osteoporosis-related fracture > or = 20% based on the [place]-adapted WHO algorithm d. Clinician judgment and/or patient preferences may indicate treatment for people with 10-year fracture probabilities above or below these levels FOLLOW-UP: People with diagnosed cases of osteoporosis or at high risk for fracture should have regular bone mineral density tests. For patients eligible for Medicare, routine testing is allowed once every 2 years. The testing frequency can be increased to one year for patients who have rapidly progressing disease, those who are receiving or discontinuing medical therapy to restore bone mass, or have additional risk factors. I have reviewed this report and agree with the above findings. Mark A. Thornton Papas, M.D. [facility] FRAX* 10-year Probability of Fracture Based  on femoral neck BMD: DualFemur (Right) Major Osteoporotic Fracture: 7.6% Hip Fracture:                0.8% Population:                  [place] (Caucasian) Risk Factors:                None *FRAX is a Materials engineer of the [employer] for Metabolic Bone Disease, a World Pharmacologist (WHO) [facility]. ASSESSMENT: The probability of a major osteoporotic fracture is 7.6 % within the next ten years. The probability of hip fracture is  0.8  % within the next 10 years. I have reviewed this report and agree with the above findings. Mark A. Thornton Papas, M.D. [facility] Electronically Signed   By: Lavonia Dana M.D.   On: 03/15/2018 11:33   Mm 3d Screen Breast Bilateral  Result Date: 03/15/2018 CLINICAL DATA:  Screening. EXAM: DIGITAL SCREENING BILATERAL MAMMOGRAM WITH TOMO AND CAD COMPARISON:  None. ACR Breast Density Category b: There are scattered areas of fibroglandular density. FINDINGS: There are no findings suspicious for malignancy. Images were processed with CAD. IMPRESSION: No mammographic evidence of malignancy. A result letter of this screening mammogram will be mailed directly to the patient. RECOMMENDATION: Screening mammogram in one year. (Code:SM-B-01Y) BI-RADS CATEGORY  1: Negative. Electronically Signed   By: Ammie Ferrier M.D.   On: 03/15/2018 12:23    Procedures Procedures (including critical care time)  Medications Ordered in ED Medications - No data to display   Initial Impression / Assessment and Plan /  ED Course  I have reviewed the triage vital signs and the nursing notes.  Pertinent labs & imaging results that were available during my care of the patient were reviewed by me and considered in my medical decision making (see chart for details).   SX suggestive of URI, infectious etiology.  CXR without pna.  Labs reassuring.  No sign of pna.  Uriticaria noted, started prior to her uri sx.  No wheezing or signs of anaphylaxis.   SYmptomatic treatment.  Follow up with PCP   Final Clinical Impressions(s) / ED Diagnoses   Final diagnoses:  Upper respiratory tract infection, unspecified type  Urticaria    ED Discharge Orders        Ordered    predniSONE (DELTASONE) 50 MG tablet  Daily     03/15/18 2300    benzonatate (TESSALON) 100 MG capsule  Every 8 hours     03/15/18 2300    guaiFENesin (MUCINEX) 600 MG 12 hr tablet  2 times daily     03/15/18 2300       Dorie Rank, MD 03/18/18 1806

## 2018-03-15 NOTE — Telephone Encounter (Signed)
Please inform patient the following information: Mammogram was normal. Her bone density showed some mild bone softening also called osteopenia.  She should be encouraged to optimize calcium and vitamin D intake calcium 1200 mg a day/vitamin D 1000 units a day. We will repeat her bone density in about 3 - 5 years.

## 2018-03-16 ENCOUNTER — Ambulatory Visit (HOSPITAL_COMMUNITY): Payer: BLUE CROSS/BLUE SHIELD

## 2018-03-16 NOTE — Telephone Encounter (Signed)
Left detailed message with results and instructions on patient voice mail per DPR also had message that patient was requesting antibiotic for congestion she is having left her a message letting her know Dr Raoul Pitch not available today if she needs to be seen she can call and try to get scheduled with another provider in [facility] system.

## 2018-03-19 ENCOUNTER — Ambulatory Visit (HOSPITAL_COMMUNITY): Payer: BLUE CROSS/BLUE SHIELD

## 2018-03-20 ENCOUNTER — Encounter: Payer: Self-pay | Admitting: Family Medicine

## 2018-03-20 DIAGNOSIS — L508 Other urticaria: Secondary | ICD-10-CM | POA: Insufficient documentation

## 2018-03-21 ENCOUNTER — Ambulatory Visit (HOSPITAL_COMMUNITY): Payer: BLUE CROSS/BLUE SHIELD

## 2018-03-23 ENCOUNTER — Telehealth: Payer: Self-pay | Admitting: Family Medicine

## 2018-03-23 ENCOUNTER — Ambulatory Visit (HOSPITAL_COMMUNITY): Payer: BLUE CROSS/BLUE SHIELD

## 2018-03-23 ENCOUNTER — Encounter: Payer: Self-pay | Admitting: Physical Medicine & Rehabilitation

## 2018-03-23 ENCOUNTER — Encounter
Payer: BLUE CROSS/BLUE SHIELD | Attending: Physical Medicine & Rehabilitation | Admitting: Physical Medicine & Rehabilitation

## 2018-03-23 VITALS — BP 145/93 | HR 93 | Ht 61.0 in | Wt 111.8 lb

## 2018-03-23 DIAGNOSIS — Z888 Allergy status to other drugs, medicaments and biological substances status: Secondary | ICD-10-CM | POA: Diagnosis not present

## 2018-03-23 DIAGNOSIS — I69391 Dysphagia following cerebral infarction: Secondary | ICD-10-CM | POA: Insufficient documentation

## 2018-03-23 DIAGNOSIS — Z87891 Personal history of nicotine dependence: Secondary | ICD-10-CM | POA: Diagnosis not present

## 2018-03-23 DIAGNOSIS — K5900 Constipation, unspecified: Secondary | ICD-10-CM | POA: Insufficient documentation

## 2018-03-23 DIAGNOSIS — Z8249 Family history of ischemic heart disease and other diseases of the circulatory system: Secondary | ICD-10-CM | POA: Insufficient documentation

## 2018-03-23 DIAGNOSIS — R1310 Dysphagia, unspecified: Secondary | ICD-10-CM | POA: Diagnosis not present

## 2018-03-23 DIAGNOSIS — M170 Bilateral primary osteoarthritis of knee: Secondary | ICD-10-CM

## 2018-03-23 DIAGNOSIS — Z833 Family history of diabetes mellitus: Secondary | ICD-10-CM | POA: Insufficient documentation

## 2018-03-23 DIAGNOSIS — I69351 Hemiplegia and hemiparesis following cerebral infarction affecting right dominant side: Secondary | ICD-10-CM | POA: Diagnosis not present

## 2018-03-23 MED ORDER — KETOROLAC TROMETHAMINE 60 MG/2ML IM SOLN: 30.0000 mg | Freq: Once | INTRAMUSCULAR | Status: DC

## 2018-03-23 MED ORDER — KETOROLAC TROMETHAMINE 30 MG/ML IJ SOLN: 30.0000 mg | Freq: Once | INTRAMUSCULAR | Status: DC

## 2018-03-23 MED ORDER — KETOROLAC TROMETHAMINE 30 MG/ML IJ SOLN
30.0000 mg | Freq: Once | INTRAMUSCULAR | Status: AC
  Administered 2018-03-23: 30 mg via INTRAMUSCULAR

## 2018-03-23 NOTE — Addendum Note (Signed)
Addended by: Marland Mcalpine B on: 03/23/2018 11:21 AM   Modules accepted: Orders

## 2018-03-23 NOTE — Progress Notes (Addendum)
Knee injection Bilateral  Indication: Knee pain not relieved by medication management and other conservative care.  Informed consent was obtained after describing risks and benefits of the procedure with the patient, this includes bleeding, bruising, infection and medication side effects. The patient wishes to proceed and has given written consent. Patient was placed in a seated position. Bilateral knees was marked and prepped with betadine in the anteromedial area. Vapocoolant spray was applied. A 21-gauge 2 inch needle was inserted into the joint space. After negative draw back for blood, a solution of Zilretta, was injected. Patient had difficulty with remaining still through procedure (LLE>RLE). A band aid was applied. Patient had some vasovagal symptoms post-procedure, place in flat position and patient hydrated. Patient ultimately required toradol injection.  Post procedure instructions were given.

## 2018-03-23 NOTE — Telephone Encounter (Signed)
Copied from [place] [id number]. Topic: Quick Communication - Rx Refill/Question >> Mar 23, 2018 12:50 PM Boyd Kerbs wrote: Medication: triamcinolone cream (KENALOG) 0.1 %  Has the patient contacted their pharmacy? No. (Agent: If no, request that the patient contact the pharmacy for the refill.) (Agent: If yes, when and what did the pharmacy advise?)  Preferred Pharmacy (with phone number or street name):  [facility] [address], [place] - [address] [place] [address] Phone: [phone] Fax: [phone]    Agent: Please be advised that RX refills may take up to 3 business days. We ask that you follow-up with your pharmacy.

## 2018-03-23 NOTE — Addendum Note (Signed)
Addended by: Marland Mcalpine B on: 03/23/2018 11:29 AM   Modules accepted: Orders

## 2018-03-24 MED ORDER — TRIAMCINOLONE ACETONIDE 0.1 % EX CREA: 1.0000 "application " | TOPICAL_CREAM | Freq: Two times a day (BID) | CUTANEOUS | 0 refills | Status: DC

## 2018-03-24 NOTE — Telephone Encounter (Signed)
[place] and spoke with Joellen Jersey who states that pt did pick up prescription sent on 03/13/18, which was a 15 day supply. Refill sent to requested pharmacy.

## 2018-03-26 ENCOUNTER — Ambulatory Visit (HOSPITAL_COMMUNITY): Payer: BLUE CROSS/BLUE SHIELD

## 2018-03-28 ENCOUNTER — Ambulatory Visit (HOSPITAL_COMMUNITY): Payer: BLUE CROSS/BLUE SHIELD

## 2018-03-30 ENCOUNTER — Ambulatory Visit (HOSPITAL_COMMUNITY): Payer: BLUE CROSS/BLUE SHIELD

## 2018-04-02 ENCOUNTER — Ambulatory Visit (HOSPITAL_COMMUNITY): Payer: BLUE CROSS/BLUE SHIELD

## 2018-04-04 ENCOUNTER — Ambulatory Visit (HOSPITAL_COMMUNITY): Payer: BLUE CROSS/BLUE SHIELD

## 2018-04-06 ENCOUNTER — Ambulatory Visit (HOSPITAL_COMMUNITY): Payer: BLUE CROSS/BLUE SHIELD

## 2018-04-09 ENCOUNTER — Ambulatory Visit (HOSPITAL_COMMUNITY): Payer: BLUE CROSS/BLUE SHIELD

## 2018-04-10 ENCOUNTER — Ambulatory Visit (INDEPENDENT_AMBULATORY_CARE_PROVIDER_SITE_OTHER): Payer: BLUE CROSS/BLUE SHIELD | Admitting: *Deleted

## 2018-04-10 DIAGNOSIS — I63512 Cerebral infarction due to unspecified occlusion or stenosis of left middle cerebral artery: Secondary | ICD-10-CM

## 2018-04-11 ENCOUNTER — Ambulatory Visit (HOSPITAL_COMMUNITY): Payer: BLUE CROSS/BLUE SHIELD

## 2018-04-11 ENCOUNTER — Other Ambulatory Visit: Payer: Self-pay

## 2018-04-11 MED ORDER — BACLOFEN 10 MG PO TABS: 10.0000 mg | ORAL_TABLET | Freq: Three times a day (TID) | ORAL | 1 refills | Status: DC

## 2018-04-11 NOTE — Progress Notes (Signed)
[facility] Summary Report / Loop Recorder 

## 2018-04-13 ENCOUNTER — Ambulatory Visit (HOSPITAL_COMMUNITY): Payer: BLUE CROSS/BLUE SHIELD

## 2018-04-13 LAB — CUP PACEART REMOTE DEVICE CHECK
Implantable Pulse Generator Implant Date: 20181203
MDC IDC SESS DTM: 20190613203642

## 2018-04-16 ENCOUNTER — Ambulatory Visit (HOSPITAL_COMMUNITY): Payer: BLUE CROSS/BLUE SHIELD

## 2018-04-16 ENCOUNTER — Telehealth: Payer: Self-pay

## 2018-04-16 MED ORDER — BACLOFEN 10 MG PO TABS: 10.0000 mg | ORAL_TABLET | Freq: Three times a day (TID) | ORAL | 1 refills | Status: DC

## 2018-04-16 NOTE — Telephone Encounter (Signed)
Refill sent into the pharmacy for Baclofen, pt called requesting

## 2018-04-18 ENCOUNTER — Encounter: Payer: Self-pay | Admitting: Family Medicine

## 2018-04-18 ENCOUNTER — Ambulatory Visit: Payer: BLUE CROSS/BLUE SHIELD | Admitting: Family Medicine

## 2018-04-18 ENCOUNTER — Ambulatory Visit (HOSPITAL_COMMUNITY): Payer: BLUE CROSS/BLUE SHIELD

## 2018-04-18 VITALS — BP 136/82 | HR 110 | Temp 97.7°F | Resp 20 | Ht 61.0 in | Wt 110.0 lb

## 2018-04-18 DIAGNOSIS — I480 Paroxysmal atrial fibrillation: Secondary | ICD-10-CM

## 2018-04-18 DIAGNOSIS — I10 Essential (primary) hypertension: Secondary | ICD-10-CM

## 2018-04-18 DIAGNOSIS — E785 Hyperlipidemia, unspecified: Secondary | ICD-10-CM

## 2018-04-18 DIAGNOSIS — I63512 Cerebral infarction due to unspecified occlusion or stenosis of left middle cerebral artery: Secondary | ICD-10-CM | POA: Diagnosis not present

## 2018-04-18 DIAGNOSIS — E611 Iron deficiency: Secondary | ICD-10-CM

## 2018-04-18 DIAGNOSIS — I5042 Chronic combined systolic (congestive) and diastolic (congestive) heart failure: Secondary | ICD-10-CM

## 2018-04-18 DIAGNOSIS — G8191 Hemiplegia, unspecified affecting right dominant side: Secondary | ICD-10-CM

## 2018-04-18 DIAGNOSIS — I214 Non-ST elevation (NSTEMI) myocardial infarction: Secondary | ICD-10-CM | POA: Diagnosis not present

## 2018-04-18 DIAGNOSIS — L508 Other urticaria: Secondary | ICD-10-CM

## 2018-04-18 DIAGNOSIS — I700 Atherosclerosis of aorta: Secondary | ICD-10-CM

## 2018-04-18 MED ORDER — TRIAMCINOLONE ACETONIDE 0.1 % EX CREA: 1.0000 "application " | TOPICAL_CREAM | Freq: Two times a day (BID) | CUTANEOUS | 2 refills | Status: DC

## 2018-04-18 MED ORDER — FUROSEMIDE 20 MG PO TABS: 20.0000 mg | ORAL_TABLET | Freq: Every day | ORAL | 1 refills | Status: DC

## 2018-04-18 NOTE — Progress Notes (Signed)
Patient ID: [name], female  DOB: January 02, 1961, 57 y.o.   MRN: [id number] Patient Care Team    Relationship Specialty Notifications Start End  [name], DO PCP - General Family Medicine  10/13/17   Satira Sark, MD PCP - Cardiology Cardiology Admissions 12/15/17   Constance Haw, MD Consulting Physician Cardiology  10/13/17   Rosalin Hawking, MD Consulting Physician Neurology  10/13/17   Jamse Arn, MD Consulting Physician Physical Medicine and Rehabilitation  10/13/17   Luanne Bras, MD Consulting Physician Interventional Radiology  10/13/17     Chief Complaint  Patient presents with  . Follow-up    chronic conditions    Subjective:  [name] is a 57 y.o.  female present for follow up on chronic conditions.   Urticaria/Stress: Patient reports she has had chronic urticaria since the hospitalization for her stroke in November. She has tried discontinuing her medications and there seems to be no offending agents. Her urticaria persisted despite her not taking her medications. Patient states she doesn't feel particularly anxious but she understands her body is going through a great deal of stress. She denies any changes in personal hygiene products. She continues to have hives.    Cerebrovascular accident (CVA) due to occlusion of left middle cerebral artery (HCC)/ Right hemiplegia (HCC)/HLD/NSTEMI/ chronic CHF/ A fib/former smoker/Iron deficiency Pt reports compliance with coreg 3.125 (1/2 tab) BID, lasix 20 mg QD, xarelto 20 mg a day. She is taking her iron supplement once a day. Blood pressures ranges at home WNL. Patient denies chest pain, shortness of breath, dizziness or lower extremity edema. She is on a statin.   Prior note: Patient suffered from a left middle cerebral artery CVA 08/21/2017. She has completed inpatient and home health physical therapy. Right-sided upper and lower extremity deficits remain. She reports very minimal use of her  right arm. She reports weakness with her right lower extremity but is able to walk with assistance for short periods if needed. She has been prescribed aspirin 325 mg a day. She also was prescribed Lipitor, however this is giving her diarrhea since she has stopped using it. She is established with neurology and cardiology and has a loop recorder. She reports she needs a referral to outpatient neuro rehabilitation. She reports she has quit smoking since having a stroke.  Depression screen [facility] 2/9 11/10/2017 11/10/2017 10/13/2017 10/05/2017 10/05/2017  Decreased Interest 0 0 0 1 0  Down, Depressed, Hopeless 0 0 0 0 0  PHQ - 2 Score 0 0 0 1 0  Altered sleeping 0 - - 0 -  Tired, decreased energy 0 - - 2 -  Change in appetite 0 - - 0 -  Feeling bad or failure about yourself  0 - - 0 -  Trouble concentrating 0 - - 1 -  Moving slowly or fidgety/restless 0 - - 1 -  Suicidal thoughts 0 - - 0 -  PHQ-9 Score 0 - - 5 -  Difficult doing work/chores Not difficult at all - - Not difficult at all -   GAD 7 : Generalized Anxiety Score 11/10/2017  Nervous, Anxious, on Edge 0  Control/stop worrying 0  Worry too much - different things 0  Trouble relaxing 0  Restless 0  Easily annoyed or irritable 0  Afraid - awful might happen 0  Total GAD 7 Score 0  Anxiety Difficulty Not difficult at all          Fall Risk  03/23/2018 02/21/2018 12/22/2017 11/02/2017 10/05/2017  Falls in the past year? No No No No No   Immunization History  Administered Date(s) Administered  . Influenza,inj,Quad PF,6+ Mos 10/13/2017    No exam data present  Past Medical History:  Diagnosis Date  . Acute and chronic respiratory failure with hypoxia ([place]) 10/2017   w/nstemi  . Arthritis    "knees" (11/16/2017)  . Aspiration pneumonia ([place]) 10/2017  . CAD (coronary artery disease)    a. s/p NSTEMI in 10/2017 with cath showing LM disease --> s/p CABG on 11/23/2017 with LIMA-LAD and SVG-OM.   [name] Hay fever   . Ischemic cardiomyopathy     a. EF 35-40% by echo in 10/2017  . NSTEMI (non-ST elevated myocardial infarction) ([place])   . Pneumonia 11/16/2017  . Stroke [facility]) 08/23/2017   Left MCA infarct status post TPA and thrombectomy   Allergies  Allergen Reactions  . Lopressor [Metoprolol Tartrate] Swelling    Angioedema  . Lipitor [Atorvastatin] Diarrhea   Past Surgical History:  Procedure Laterality Date  . CORONARY ARTERY BYPASS GRAFT N/A 11/23/2017   Procedure: CORONARY ARTERY BYPASS GRAFTING (CABG) x 2, ON PUMP, USING LEFT INTERNAL MAMMARY ARTERY AND RIGHT GREATER SAPHENOUS VEIN HARVESTED ENDOSCOPICALLY;  Surgeon: Gaye Pollack, MD;  Location: [place];  Service: Open Heart Surgery;  Laterality: N/A;  . CRYOABLATION     cervical  . IR ANGIO INTRA EXTRACRAN SEL COM CAROTID INNOMINATE UNI R MOD SED  08/21/2017  . IR ANGIO VERTEBRAL SEL SUBCLAVIAN INNOMINATE UNI R MOD SED  08/21/2017  . IR PERCUTANEOUS ART THROMBECTOMY/INFUSION INTRACRANIAL INC DIAG ANGIO  08/21/2017  . IR RADIOLOGIST EVAL & MGMT  10/30/2017  . LOOP RECORDER INSERTION N/A 08/28/2017   Procedure: LOOP RECORDER INSERTION;  Surgeon: Constance Haw, MD;  Location: [place] CV LAB;  Service: Cardiovascular;  Laterality: N/A;  . RADIOLOGY WITH ANESTHESIA N/A 08/21/2017   Procedure: RADIOLOGY WITH ANESTHESIA;  Surgeon: Luanne Bras, MD;  Location: [place];  Service: Radiology;  Laterality: N/A;  . RIGHT/LEFT HEART CATH AND CORONARY ANGIOGRAPHY N/A 11/20/2017   Procedure: RIGHT/LEFT HEART CATH AND CORONARY ANGIOGRAPHY;  Surgeon: Jettie Booze, MD;  Location: [place] [facility];  Service: Cardiovascular;  Laterality: N/A;  . TEE WITHOUT CARDIOVERSION N/A 08/28/2017   Procedure: TRANSESOPHAGEAL ECHOCARDIOGRAM (TEE);  Surgeon: Fay Records, MD;  Location: [place];  Service: Cardiovascular;  Laterality: N/A;  . TEE WITHOUT CARDIOVERSION N/A 11/23/2017   Procedure: TRANSESOPHAGEAL ECHOCARDIOGRAM (TEE);  Surgeon: Gaye Pollack, MD;  Location: [place];  Service: Open Heart Surgery;  Laterality: N/A;  . TUBAL LIGATION     Family History  Problem Relation Age of Onset  . Heart attack Father   . Hypertension Mother   . Hyperlipidemia Mother   . Diabetes type II Mother   . Hypertension Brother   . Breast cancer Cousin 33   Social History   Socioeconomic History  . Marital status: Married    Spouse name: Not on file  . Number of children: 2  . Years of education: Not on file  . Highest education level: Not on file  Occupational History  . Not on file  Social Needs  . Financial resource strain: Not on file  . Food insecurity:    Worry: Not on file    Inability: Not on file  . Transportation needs:    Medical: Not on file    Non-medical: Not on file  Tobacco Use  . Smoking status:  Former Smoker    Packs/day: 0.12    Years: 37.00    Pack years: 4.44    Types: Cigarettes    Last attempt to quit: 08/21/2017    Years since quitting: 0.6  . Smokeless tobacco: Never Used  Substance and Sexual Activity  . Alcohol use: No    Frequency: Never  . Drug use: No  . Sexual activity: Not on file  Lifestyle  . Physical activity:    Days per week: Not on file    Minutes per session: Not on file  . Stress: Not on file  Relationships  . Social connections:    Talks on phone: Patient refused    Gets together: Patient refused    Attends religious service: Patient refused    Active member of club or organization: Patient refused    Attends meetings of clubs or organizations: Patient refused    Relationship status: Patient refused  . Intimate partner violence:    Fear of current or ex partner: Patient refused    Emotionally abused: Patient refused    Physically abused: Patient refused    Forced sexual activity: Patient refused  Other Topics Concern  . Not on file  Social History Narrative   Married. 2 children.    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    Walks with cane or wheelchair.    Former smoker.    Drinks caffeine.     Smoke alarm in the home.   Firearms in the home.    Wears her seatbelt.    Feels safe In her relationships.    Allergies as of 04/18/2018      Reactions   Lopressor [metoprolol Tartrate] Swelling   Angioedema   Lipitor [atorvastatin] Diarrhea      Medication List        Accurate as of 04/18/18  2:12 PM. Always use your most recent med list.          baclofen 10 MG tablet Commonly known as:  LIORESAL Take 1 tablet (10 mg total) by mouth 3 (three) times daily.   calcium carbonate 500 MG chewable tablet Commonly known as:  TUMS - dosed in mg elemental calcium Chew 1 tablet by mouth daily.   carvedilol 3.125 MG tablet Commonly known as:  COREG Take 1 tablet (3.125 mg total) by mouth 2 (two) times daily with a meal.   cholecalciferol 1000 units tablet Commonly known as:  VITAMIN D Take 1,000 Units by mouth daily.   escitalopram 10 MG tablet Commonly known as:  LEXAPRO Take 1 tablet (10 mg total) by mouth daily.   famotidine 20 MG tablet Commonly known as:  PEPCID Take 20 mg by mouth 2 (two) times daily.   ferrous sulfate 325 (65 FE) MG tablet Take 325 mg by mouth daily with breakfast.   furosemide 20 MG tablet Commonly known as:  LASIX Take 1 tablet (20 mg total) by mouth daily.   levocetirizine 5 MG tablet Commonly known as:  XYZAL Take 1 tablet (5 mg total) by mouth every evening.   Potassium Chloride ER 20 MEQ Tbcr Take 20 mEq by mouth daily.   ranitidine 150 MG tablet Commonly known as:  ZANTAC Take 1 tablet (150 mg total) by mouth 2 (two) times daily.   rivaroxaban 20 MG Tabs tablet Commonly known as:  XARELTO Take 1 tablet (20 mg total) by mouth daily with supper.   rosuvastatin 20 MG tablet Commonly known as:  CRESTOR Take 1 tablet (20 mg total) by mouth  daily.   traMADol 50 MG tablet Commonly known as:  ULTRAM Take 1 tablet (50 mg total) by mouth every 12 (twelve) hours as needed for moderate pain.   triamcinolone cream 0.1 % Commonly known  as:  KENALOG Apply 1 application topically 2 (two) times daily.   vitamin B-12 100 MCG tablet Commonly known as:  CYANOCOBALAMIN Take 100 mcg by mouth daily.       All past medical history, surgical history, allergies, family history, immunizations andmedications were updated in the EMR today and reviewed under the history and medication portions of their EMR.      No results found.   ROS: 14 pt review of systems performed and negative (unless mentioned in an HPI)  Objective: BP 136/82 (BP Location: Left Arm, Patient Position: Sitting, Cuff Size: Normal)   Pulse (!) 110   Temp 97.7 F (36.5 C)   Resp 20   Ht 5\' 1"  (1.549 m)   Wt 110 lb (49.9 kg)   LMP  (LMP Unknown)   SpO2 96%   BMI 20.78 kg/m   Gen: Afebrile. No acute distress. Nontoxic. Very pleasant caucasian female.  HENT: AT. [place].  MMM.  Eyes:Pupils Equal Round Reactive to light, Extraocular movements intact,  Conjunctiva without redness, discharge or icterus. CV: RRR 1/6 SM, no edema, +2/4 P posterior tibialis pulses Chest: CTAB, no wheeze or crackles Abd: Soft. NTND. BS present. no Masses palpated.  Skin: no rashes, purpura or petechiae. Dry skin. Neuro:PERLA. EOMi. Alert. Oriented.  Psych: Normal affect, dress and demeanor. Normal speech. Normal thought content and judgment.  Assessment/plan: [name] is a 57 y.o. female present for establishment of care. Urticaria/Anxiety - continue zantac, xyzal, lexapro 10mg .  - f/u 6 months if needing refills  Cerebrovascular accident (CVA) due to occlusion of left middle cerebral artery (HCC)/ Right hemiplegia (HCC)/HLD/NSTEMI/ chronic CHF/ A fib/former smoker/Iron deficiency - Congratulated her on smoking cessation. She hasn't had a cigarette since her stroke. - continue iron with recheck today.  - Continue coreg, xarelto, lasix (can try to use 10 mg QD if desired- with instructions on when to increase back to 20 mg if needed).  - Lipitor caused diarrhea.  ContinueTrosuvastatin 20 mg daily. Discussed the need of statin medication and its futility in stroke prevention.  - continue baclofen - continue potassium while on diuretic - Patient is established with neurology and cardiology. Follow-up routinely as advised with her specialist.  - still working with outpt PT  No follow-ups on file.  Orders Placed This Encounter  Procedures  . Iron, TIBC and Ferritin Panel    .  Note is dictated utilizing voice recognition software. Although note has been proof read prior to signing, occasional typographical errors still can be missed. If any questions arise, please do not hesitate to call for verification.  Electronically signed by: Howard Pouch, DO [place]

## 2018-04-18 NOTE — Patient Instructions (Signed)
Medication refills today on meds we provide.   We will check your iron levels today.   If you change your mind about an allergist, just let us know and I will place it for you.    Follow up every 6 months unless need [place] sooner  Please help [place] help you:  We are honored you have chosen [facility] for your Primary Care home. Below you will find basic instructions that you may need to access in the future. Please help [place] help you by reading the instructions, which cover many of the frequent questions we experience.   Prescription refills and request:  -In order to allow more efficient response time, please call your pharmacy for all refills. They will forward the request electronically to [place]. This allows for the quickest possible response. Request left on a nurse line can take longer to refill, since these are checked as time allows between office patients and other phone calls.  - refill request can take up to 3-5 working days to complete.  - If request is sent electronically and request is appropiate, it is usually completed in 1-2 business days.  - all patients will need to be seen routinely for all chronic medical conditions requiring prescription medications (see follow-up below). If you are overdue for follow up on your condition, you will be asked to make an appointment and we will call in enough medication to cover you until your appointment (up to 30 days).  - all controlled substances will require a face to face visit to request/refill.  - if you desire your prescriptions to go through a new pharmacy, and have an active script at original pharmacy, you will need to call your pharmacy and have scripts transferred to new pharmacy. This is completed between the pharmacy locations and not by your provider.    Results: If any images or labs were ordered, it can take up to 1 week to get results depending on the test ordered and the lab/facility running and resulting the test. - Normal or  stable results, which do not need further discussion, may be released to your mychart immediately with attached note to you. A call may not be generated for normal results. Please make certain to sign up for mychart. If you have questions on how to activate your mychart you can call the front office.  - If your results need further discussion, our office will attempt to contact you via phone, and if unable to reach you after 2 attempts, we will release your abnormal result to your mychart with instructions.  - All results will be automatically released in mychart after 1 week.  - Your provider will provide you with explanation and instruction on all relevant material in your results. Please keep in mind, results and labs may appear confusing or abnormal to the untrained eye, but it does not mean they are actually abnormal for you personally. If you have any questions about your results that are not covered, or you desire more detailed explanation than what was provided, you should make an appointment with your provider to do so.   Our office handles many outgoing and incoming calls daily. If we have not contacted you within 1 week about your results, please check your mychart to see if there is a message first and if not, then contact our office.  In helping with this matter, you help decrease call volume, and therefore allow [place] to be able to respond to patients needs more  efficiently.   Acute office visits (sick visit):  An acute visit is intended for a new problem and are scheduled in shorter time slots to allow schedule openings for patients with new problems. This is the appropriate visit to discuss a new problem. Problems will not be addressed by phone call or Echart message. Appointment is needed if requesting treatment. In order to provide you with excellent quality medical care with proper time for you to explain your problem, have an exam and receive treatment with instructions, these appointments  should be limited to one new problem per visit. If you experience a new problem, in which you desire to be addressed, please make an acute office visit, we save openings on the schedule to accommodate you. Please do not save your new problem for any other type of visit, let us take care of it properly and quickly for you.   Follow up visits:  Depending on your condition(s) your provider will need to see you routinely in order to provide you with quality care and prescribe medication(s). Most chronic conditions (Example: hypertension, Diabetes, depression/anxiety... etc), require visits a couple times a year. Your provider will instruct you on proper follow up for your personal medical conditions and history. Please make certain to make follow up appointments for your condition as instructed. Failing to do so could result in lapse in your medication treatment/refills. If you request a refill, and are overdue to be seen on a condition, we will always provide you with a 30 day script (once) to allow you time to schedule.    Medicare wellness (well visit): - we have a wonderful Nurse Maudie Mercury), that will meet with you and provide you will yearly medicare wellness visits. These visits should occur yearly (can not be scheduled less than 1 calendar year apart) and cover preventive health, immunizations, advance directives and screenings you are entitled to yearly through your medicare benefits. Do not miss out on your entitled benefits, this is when medicare will pay for these benefits to be ordered for you.  These are strongly encouraged by your provider and is the appropriate type of visit to make certain you are up to date with all preventive health benefits. If you have not had your medicare wellness exam in the last 12 months, please make certain to schedule one by calling the office and schedule your medicare wellness with Maudie Mercury as soon as possible.   Yearly physical (well visit):  - Adults are recommended to be  seen yearly for physicals. Check with your insurance and date of your last physical, most insurances require one calendar year between physicals. Physicals include all preventive health topics, screenings, medical exam and labs that are appropriate for gender/age and history. You may have fasting labs needed at this visit. This is a well visit (not a sick visit), new problems should not be covered during this visit (see acute visit).  - Pediatric patients are seen more frequently when they are younger. Your provider will advise you on well child visit timing that is appropriate for your their age. - This is not a medicare wellness visit. Medicare wellness exams do not have an exam portion to the visit. Some medicare companies allow for a physical, some do not allow a yearly physical. If your medicare allows a yearly physical you can schedule the medicare wellness with our nurse Maudie Mercury and have your physical with your provider after, on the same day. Please check with insurance for your full benefits.   Late  Policy/No Shows:  - all new patients should arrive 15-30 minutes earlier than appointment to allow [place] time  to  obtain all personal demographics,  insurance information and for you to complete office paperwork. - All established patients should arrive 10-15 minutes earlier than appointment time to update all information and be checked in .  - In our best efforts to run on time, if you are late for your appointment you will be asked to either reschedule or if able, we will work you back into the schedule. There will be a wait time to work you back in the schedule,  depending on availability.  - If you are unable to make it to your appointment as scheduled, please call 24 hours ahead of time to allow [place] to fill the time slot with someone else who needs to be seen. If you do not cancel your appointment ahead of time, you may be charged a no show fee.

## 2018-04-19 LAB — IRON,TIBC AND FERRITIN PANEL
%SAT: 89 % (calc) — ABNORMAL HIGH (ref 16–45)
Ferritin: 63 ng/mL (ref 16–232)
Iron: 269 ug/dL — ABNORMAL HIGH (ref 45–160)
TIBC: 301 ug/dL (ref 250–450)

## 2018-04-20 ENCOUNTER — Ambulatory Visit (HOSPITAL_COMMUNITY): Payer: BLUE CROSS/BLUE SHIELD

## 2018-04-23 ENCOUNTER — Encounter: Payer: Self-pay | Admitting: Family Medicine

## 2018-04-23 ENCOUNTER — Ambulatory Visit (HOSPITAL_COMMUNITY): Payer: BLUE CROSS/BLUE SHIELD

## 2018-04-23 DIAGNOSIS — E611 Iron deficiency: Secondary | ICD-10-CM | POA: Insufficient documentation

## 2018-04-23 MED ORDER — POTASSIUM CHLORIDE ER 20 MEQ PO TBCR: 20.0000 meq | EXTENDED_RELEASE_TABLET | Freq: Every day | ORAL | 1 refills | Status: DC

## 2018-04-25 ENCOUNTER — Encounter: Payer: BLUE CROSS/BLUE SHIELD | Admitting: Physical Medicine & Rehabilitation

## 2018-04-25 ENCOUNTER — Ambulatory Visit (HOSPITAL_COMMUNITY): Payer: BLUE CROSS/BLUE SHIELD

## 2018-04-27 ENCOUNTER — Ambulatory Visit (HOSPITAL_COMMUNITY): Payer: BLUE CROSS/BLUE SHIELD

## 2018-04-30 ENCOUNTER — Ambulatory Visit (HOSPITAL_COMMUNITY): Payer: BLUE CROSS/BLUE SHIELD

## 2018-05-02 ENCOUNTER — Encounter
Payer: BLUE CROSS/BLUE SHIELD | Attending: Physical Medicine & Rehabilitation | Admitting: Physical Medicine & Rehabilitation

## 2018-05-02 ENCOUNTER — Encounter: Payer: Self-pay | Admitting: Physical Medicine & Rehabilitation

## 2018-05-02 ENCOUNTER — Ambulatory Visit (HOSPITAL_COMMUNITY): Payer: BLUE CROSS/BLUE SHIELD

## 2018-05-02 VITALS — BP 147/87 | HR 82 | Ht 61.0 in | Wt 113.0 lb

## 2018-05-02 DIAGNOSIS — G8110 Spastic hemiplegia affecting unspecified side: Secondary | ICD-10-CM

## 2018-05-02 DIAGNOSIS — I69391 Dysphagia following cerebral infarction: Secondary | ICD-10-CM | POA: Insufficient documentation

## 2018-05-02 DIAGNOSIS — Z8249 Family history of ischemic heart disease and other diseases of the circulatory system: Secondary | ICD-10-CM | POA: Diagnosis not present

## 2018-05-02 DIAGNOSIS — I69351 Hemiplegia and hemiparesis following cerebral infarction affecting right dominant side: Secondary | ICD-10-CM | POA: Insufficient documentation

## 2018-05-02 DIAGNOSIS — M170 Bilateral primary osteoarthritis of knee: Secondary | ICD-10-CM

## 2018-05-02 DIAGNOSIS — Z888 Allergy status to other drugs, medicaments and biological substances status: Secondary | ICD-10-CM | POA: Insufficient documentation

## 2018-05-02 DIAGNOSIS — I10 Essential (primary) hypertension: Secondary | ICD-10-CM

## 2018-05-02 DIAGNOSIS — Z951 Presence of aortocoronary bypass graft: Secondary | ICD-10-CM

## 2018-05-02 DIAGNOSIS — Z833 Family history of diabetes mellitus: Secondary | ICD-10-CM | POA: Diagnosis not present

## 2018-05-02 DIAGNOSIS — Z87891 Personal history of nicotine dependence: Secondary | ICD-10-CM | POA: Diagnosis not present

## 2018-05-02 DIAGNOSIS — K5900 Constipation, unspecified: Secondary | ICD-10-CM | POA: Diagnosis not present

## 2018-05-02 DIAGNOSIS — R269 Unspecified abnormalities of gait and mobility: Secondary | ICD-10-CM

## 2018-05-02 DIAGNOSIS — I63512 Cerebral infarction due to unspecified occlusion or stenosis of left middle cerebral artery: Secondary | ICD-10-CM

## 2018-05-02 DIAGNOSIS — R1310 Dysphagia, unspecified: Secondary | ICD-10-CM | POA: Diagnosis not present

## 2018-05-02 NOTE — Progress Notes (Signed)
Subjective:    Patient ID: [name], female    DOB: March 22, 1961, 57 y.o.   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    Last clinic visit 03/23/18.  She had b/l knee injections on last visit, but notes improvement in pain.  She notes some dragging of her RLE. She has completed therapies. She has not followed up regarding Vascular study. She notices improvement in Baclofen, but does cause sedation. Denies falls, not using cane in home. She has not tried OTC Lidoderm patches still.   Pain Inventory Average Pain 3 Pain Right Now 3 My pain is aching  In the last 24 hours, has pain interfered with the following? General activity 4 Relation with others 4 Enjoyment of life 4 What TIME of day is your pain at its worst? varies Sleep (in general) Poor  Pain is worse with: unsure Pain improves with: medication Relief from Meds: 6  Mobility walk with assistance use a cane ability to climb steps?  no do you drive?  no Do you have any goals in this area?  yes  Function retired  Neuro/Psych trouble walking  Prior Studies Any changes since last visit?  no  Physicians involved in your care Any changes since last visit?  no   Family History  Problem Relation Age of Onset  . Heart attack Father   . Hypertension Mother   . Hyperlipidemia Mother   . Diabetes type II Mother   . Hypertension Brother   . Breast cancer Cousin 9   Social History   Socioeconomic History  . Marital status: Married    Spouse name: Not on file  . Number of children: 2  . Years of education: Not on file  . Highest education level: Not on file  Occupational History  . Not on file  Social Needs  . Financial resource strain: Not on file  . Food insecurity:    Worry: Not on file   Inability: Not on file  . Transportation needs:    Medical: Not on file    Non-medical: Not on file  Tobacco Use  . Smoking status: Former Smoker    Packs/day: 0.12    Years: 37.00    Pack years: 4.44    Types: Cigarettes    Last attempt to quit: 08/21/2017    Years since quitting: 0.6  . Smokeless tobacco: Never Used  Substance and Sexual Activity  . Alcohol use: No    Frequency: Never  . Drug use: No  . Sexual activity: Not on file  Lifestyle  . Physical activity:    Days per week: Not on file    Minutes per session: Not on file  . Stress: Not on file  Relationships  . Social connections:    Talks on phone: Patient refused    Gets together: Patient refused    Attends religious service: Patient refused    Active member of club or organization: Patient refused    Attends meetings of clubs or organizations: Patient refused    Relationship status: Patient refused  Other Topics Concern  . Not on file  Social History Narrative   Married. 2 children.    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    Walks with cane or wheelchair.    Former smoker.    Drinks caffeine.  Smoke alarm in the home.   Firearms in the home.    Wears her seatbelt.    Feels safe In her relationships.    Past Surgical History:  Procedure Laterality Date  . CORONARY ARTERY BYPASS GRAFT N/A 11/23/2017   Procedure: CORONARY ARTERY BYPASS GRAFTING (CABG) x 2, ON PUMP, USING LEFT INTERNAL MAMMARY ARTERY AND RIGHT GREATER SAPHENOUS VEIN HARVESTED ENDOSCOPICALLY;  Surgeon: Gaye Pollack, MD;  Location: [place];  Service: Open Heart Surgery;  Laterality: N/A;  . CRYOABLATION     cervical  . IR ANGIO INTRA EXTRACRAN SEL COM CAROTID INNOMINATE UNI R MOD SED  08/21/2017  . IR ANGIO VERTEBRAL SEL SUBCLAVIAN INNOMINATE UNI R MOD SED  08/21/2017  . IR PERCUTANEOUS ART THROMBECTOMY/INFUSION INTRACRANIAL INC DIAG ANGIO  08/21/2017  . IR RADIOLOGIST EVAL & MGMT  10/30/2017  . LOOP RECORDER INSERTION N/A 08/28/2017    Procedure: LOOP RECORDER INSERTION;  Surgeon: Constance Haw, MD;  Location: [place] [facility];  Service: Cardiovascular;  Laterality: N/A;  . RADIOLOGY WITH ANESTHESIA N/A 08/21/2017   Procedure: RADIOLOGY WITH ANESTHESIA;  Surgeon: Luanne Bras, MD;  Location: [place];  Service: Radiology;  Laterality: N/A;  . RIGHT/LEFT HEART CATH AND CORONARY ANGIOGRAPHY N/A 11/20/2017   Procedure: RIGHT/LEFT HEART CATH AND CORONARY ANGIOGRAPHY;  Surgeon: Jettie Booze, MD;  Location: [place] [facility];  Service: Cardiovascular;  Laterality: N/A;  . TEE WITHOUT CARDIOVERSION N/A 08/28/2017   Procedure: TRANSESOPHAGEAL ECHOCARDIOGRAM (TEE);  Surgeon: Fay Records, MD;  Location: [place];  Service: Cardiovascular;  Laterality: N/A;  . TEE WITHOUT CARDIOVERSION N/A 11/23/2017   Procedure: TRANSESOPHAGEAL ECHOCARDIOGRAM (TEE);  Surgeon: Gaye Pollack, MD;  Location: [place];  Service: Open Heart Surgery;  Laterality: N/A;  . TUBAL LIGATION     Past Medical History:  Diagnosis Date  . Acute and chronic respiratory failure with hypoxia ([place]) 10/2017   w/nstemi  . Arthritis    "knees" (11/16/2017)  . Aspiration pneumonia ([place]) 10/2017  . CAD (coronary artery disease)    a. s/p NSTEMI in 10/2017 with cath showing LM disease --> s/p CABG on 11/23/2017 with LIMA-LAD and SVG-OM.   [name] Hay fever   . Ischemic cardiomyopathy    a. EF 35-40% by echo in 10/2017  . NSTEMI (non-ST elevated myocardial infarction) ([place])   . Pneumonia 11/16/2017  . Stroke ([place]) 08/23/2017   Left MCA infarct status post TPA and thrombectomy   BP (!) 147/87   Pulse 82   Ht 5\' 1"  (1.549 m)   Wt 113 lb (51.3 kg)   LMP  (LMP Unknown)   SpO2 93%   BMI 21.35 kg/m   Opioid Risk Score:   Fall Risk Score:  `1  Depression screen PHQ 2/9  Depression screen [facility] 2/9 11/10/2017 11/10/2017 10/13/2017 10/05/2017 10/05/2017  Decreased Interest 0 0 0 1 0  Down, Depressed, Hopeless 0 0 0 0 0  PHQ - 2 Score 0 0 0 1 0  Altered  sleeping 0 - - 0 -  Tired, decreased energy 0 - - 2 -  Change in appetite 0 - - 0 -  Feeling bad or failure about yourself  0 - - 0 -  Trouble concentrating 0 - - 1 -  Moving slowly or fidgety/restless 0 - - 1 -  Suicidal thoughts 0 - - 0 -  PHQ-9 Score 0 - - 5 -  Difficult doing work/chores Not difficult at all - - Not difficult at all -  Review of Systems  HENT: Negative.   Eyes: Negative.   Respiratory: Negative.   Cardiovascular: Positive for leg swelling.  Gastrointestinal: Negative.   Endocrine: Negative.   Genitourinary: Negative.   Musculoskeletal: Positive for arthralgias, gait problem, joint swelling and myalgias.  Skin: Negative.   Allergic/Immunologic: Negative.   Neurological: Positive for weakness and numbness.  Hematological: Negative.   Psychiatric/Behavioral: Negative.   All other systems reviewed and are negative.     Objective:   Physical Exam Constitutional: She appears well-developed and well-nourished.  HENT: Atraumatic. Normocephalic. Eyes: EOMI. No discharge.  Cardiovascular: RRR. No JVD   Respiratory: Normal effort. Clear. GI: Bowel sounds are normal. Non-distended Musculoskeletal: No edema or tenderness in extremities. Neurological: She is alert and oriented.  Motor: Right upper extremity: 2+/5 shoulder abduction, 2+/5 elbow flex/ext, 3+/5 hand grip  Right lower extremity: Hip flexion 4/5, knee extension 4/5, ADF/PF 4/5  MAS: elbow flexors 0/4, wrist ext 1/4, finger flexors 1/4, ankle plantor flexors 1/4 Skin: Warm and dry.  Intact.  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  1. 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 Status post  loop recorder  Cont HEP  Encouraged compliance with PRAFO/WHO/AFO again  Cont Fluoxetine   Pt to follow up with Vascular in a few months for arterial study that was not performed, reminded to follow up again  2. Spastic hemiplegia  Cont Baclofen to 10 TID (typically takes BID)  Will hold off on Botox at this time  3. CAD S/P CABG x2 11/24/2017.  Follow up with Cards  Cont Xarelto by Cards   4. Neurologic gait abnormality  With foot drag  Cont therapies  Cont quad cane  Encouraged use of AFO  5. Pain   From Sternal incision - resolved  Improving  6. Bilateral knee OA  B/l Zilretta injections performed on 03/23/18  Good benefit  Encouraged trial Lidoderm patch  7. Essential HTN  States she had 2 cups of coffee and [facility] before appointment

## 2018-05-04 ENCOUNTER — Ambulatory Visit (HOSPITAL_COMMUNITY): Payer: BLUE CROSS/BLUE SHIELD

## 2018-05-07 ENCOUNTER — Ambulatory Visit (HOSPITAL_COMMUNITY): Payer: BLUE CROSS/BLUE SHIELD

## 2018-05-07 ENCOUNTER — Encounter: Payer: Self-pay | Admitting: Cardiology

## 2018-05-07 NOTE — Progress Notes (Signed)
Cardiology Office Note  Date: 05/08/2018   ID: [name], DOB 1960-10-09, MRN [id number]  PCP: [name], DO  Primary Cardiologist: [name], MD   Chief Complaint  Patient presents with  . Coronary Artery Disease  . PAF    History of Present Illness: [name] is a 57 y.o. female last seen by [name] PA-C in May, I met her in February of this year during inpatient consultation.  I reviewed extensive interval records and updated the chart.  She is here today with her husband for a follow-up visit.  From a cardiac perspective, she reports no palpitations or angina symptoms.  Patient has ILR in place, followed by Dr. Curt Bears.  Last interrogation in June indicated no new atrial fibrillation episodes.  She is on Xarelto for stroke prophylaxis, reports no bleeding problems.  I reviewed her most recent lab work.  Patient does report feeling like her right lower leg is cooler than the left, no definite pain however.  He has some discoloration and swelling in her right arm which could be RSD following her stroke.  She is also having some recurring hives and pruritus with plan to see an allergy specialist.  Cardiac regimen includes Coreg, Lasix, potassium supplements, Crestor, and Xarelto.  She is no longer on antiplatelet therapy.  Past Medical History:  Diagnosis Date  . Arthritis   . CAD (coronary artery disease)    a. s/p NSTEMI in 10/2017 with cath showing LM disease --> s/p CABG on 11/23/2017 with LIMA-LAD and SVG-OM.   [name] Hay fever   . Ischemic cardiomyopathy    a. EF 35-40% by echo in 10/2017  . NSTEMI (non-ST elevated myocardial infarction) ([place])   . PAF (paroxysmal atrial fibrillation) ([place])    Diagnosed by ILR  . Pneumonia 11/16/2017  . Stroke [facility]) 08/23/2017   Left MCA infarct status post TPA and thrombectomy    Past Surgical History:  Procedure Laterality Date  . CORONARY ARTERY BYPASS GRAFT N/A 11/23/2017   Procedure: CORONARY ARTERY  BYPASS GRAFTING (CABG) x 2, ON PUMP, USING LEFT INTERNAL MAMMARY ARTERY AND RIGHT GREATER SAPHENOUS VEIN HARVESTED ENDOSCOPICALLY;  Surgeon: Gaye Pollack, MD;  Location: [place];  Service: Open Heart Surgery;  Laterality: N/A;  . CRYOABLATION     cervical  . IR ANGIO INTRA EXTRACRAN SEL COM CAROTID INNOMINATE UNI R MOD SED  08/21/2017  . IR ANGIO VERTEBRAL SEL SUBCLAVIAN INNOMINATE UNI R MOD SED  08/21/2017  . IR PERCUTANEOUS ART THROMBECTOMY/INFUSION INTRACRANIAL INC DIAG ANGIO  08/21/2017  . IR RADIOLOGIST EVAL & MGMT  10/30/2017  . LOOP RECORDER INSERTION N/A 08/28/2017   Procedure: LOOP RECORDER INSERTION;  Surgeon: Constance Haw, MD;  Location: [place] [facility];  Service: Cardiovascular;  Laterality: N/A;  . RADIOLOGY WITH ANESTHESIA N/A 08/21/2017   Procedure: RADIOLOGY WITH ANESTHESIA;  Surgeon: Luanne Bras, MD;  Location: [place];  Service: Radiology;  Laterality: N/A;  . RIGHT/LEFT HEART CATH AND CORONARY ANGIOGRAPHY N/A 11/20/2017   Procedure: RIGHT/LEFT HEART CATH AND CORONARY ANGIOGRAPHY;  Surgeon: Jettie Booze, MD;  Location: [place] [facility];  Service: Cardiovascular;  Laterality: N/A;  . TEE WITHOUT CARDIOVERSION N/A 08/28/2017   Procedure: TRANSESOPHAGEAL ECHOCARDIOGRAM (TEE);  Surgeon: Fay Records, MD;  Location: [place];  Service: Cardiovascular;  Laterality: N/A;  . TEE WITHOUT CARDIOVERSION N/A 11/23/2017   Procedure: TRANSESOPHAGEAL ECHOCARDIOGRAM (TEE);  Surgeon: Gaye Pollack, MD;  Location: [place];  Service: Open Heart  Surgery;  Laterality: N/A;  . TUBAL LIGATION      Current Outpatient Medications  Medication Sig Dispense Refill  . baclofen (LIORESAL) 10 MG tablet Take 1 tablet (10 mg total) by mouth 3 (three) times daily. 90 each 1  . calcium carbonate (TUMS - DOSED IN MG ELEMENTAL CALCIUM) 500 MG chewable tablet Chew 1 tablet by mouth daily.    . carvedilol (COREG) 6.25 MG tablet Take 3.125 mg by mouth 2 (two) times daily with a meal.      . cholecalciferol (VITAMIN D) 1000 units tablet Take 1,000 Units by mouth daily.    [name] escitalopram (LEXAPRO) 10 MG tablet Take 1 tablet (10 mg total) by mouth daily. 90 tablet 1  . famotidine (PEPCID) 20 MG tablet Take 20 mg by mouth 2 (two) times daily.    . ferrous sulfate 325 (65 FE) MG tablet Take 325 mg by mouth daily with breakfast.    . furosemide (LASIX) 20 MG tablet Take 1 tablet (20 mg total) by mouth daily. 90 tablet 1  . levocetirizine (XYZAL) 5 MG tablet Take 1 tablet (5 mg total) by mouth every evening. 90 tablet 1  . Potassium Chloride ER 20 MEQ TBCR Take 20 mEq by mouth daily. 90 tablet 1  . ranitidine (ZANTAC) 150 MG tablet Take 1 tablet (150 mg total) by mouth 2 (two) times daily. 180 tablet 1  . rivaroxaban (XARELTO) 20 MG TABS tablet Take 1 tablet (20 mg total) by mouth daily with supper. 30 tablet 6  . rosuvastatin (CRESTOR) 20 MG tablet Take 1 tablet (20 mg total) by mouth daily. 90 tablet 3  . triamcinolone cream (KENALOG) 0.1 % Apply 1 application topically 2 (two) times daily. 30 g 2  . vitamin B-12 (CYANOCOBALAMIN) 100 MCG tablet Take 100 mcg by mouth daily.     No current facility-administered medications for this visit.    Allergies:  Lopressor [metoprolol tartrate] and Lipitor [atorvastatin]   Social History: The patient  reports that she quit smoking about 8 months ago. Her smoking use included cigarettes. She has a 4.44 pack-year smoking history. She has never used smokeless tobacco. She reports that she does not drink alcohol or use drugs.   ROS:  Please see the history of present illness. Otherwise, complete review of systems is positive for right-sided weakness following stroke.  All other systems are reviewed and negative.   Physical Exam: VS:  BP (!) 146/90   Pulse 88   Ht _0  (1.549 m)   Wt 114 lb (51.7 kg)   LMP  (LMP Unknown)   SpO2 94% Comment: on room air  BMI 21.54 kg/m , BMI Body mass index is 21.54 kg/m.  Wt Readings from Last 3  Encounters:  05/08/18 114 lb (51.7 kg)  05/02/18 113 lb (51.3 kg)  04/18/18 110 lb (49.9 kg)    General: Chronically ill-appearing woman, appears comfortable at rest. HEENT: Conjunctiva and lids normal, oropharynx clear. Neck: Supple, no elevated JVP, left carotid bruit, no thyromegaly. Lungs: Decreased breath sounds without wheezing, nonlabored breathing at rest. Cardiac: Regular rate and rhythm, no S3, soft systolic murmur, no pericardial rub. Abdomen: Soft, nontender, bowel sounds present. Extremities: Mild edema right arm with associated erythema, distal pulses 1+. Skin: Warm and dry. Musculoskeletal: No kyphosis.  Brace on right lower leg. Neuropsychiatric: Alert and oriented x3, affect grossly appropriate.  ECG: I personally reviewed the tracing from 03/15/2018 which showed sinus tachycardia with left atrial enlargement.  Recent Labwork: 11/18/2017: B  Natriuretic Peptide 1,276.7 11/24/2017: Magnesium 2.3 11/30/2017: ALT 10; AST 20 02/02/2018: TSH 1.98 03/15/2018: BUN 13; Creatinine, Ser 0.77; Hemoglobin 14.5; Platelets 364; Potassium 3.8; Sodium 140     Component Value Date/Time   CHOL 237 (H) 08/22/2017 0811   TRIG 295 (H) 08/22/2017 0811   TRIG 301 (H) 08/22/2017 0811   HDL 35 (L) 08/22/2017 0811   CHOLHDL 6.8 08/22/2017 0811   VLDL 59 (H) 08/22/2017 0811   LDLCALC 143 (H) 08/22/2017 0811    Other Studies Reviewed Today:  Echocardiogram 11/16/2017: Study Conclusions  - Left ventricle: The cavity size was mildly dilated. Wall thickness was increased in a pattern of mild LVH. Systolic function was moderately reduced. The estimated ejection fraction was in the range of 35% to 40%. Diffuse hypokinesis. There is akinesis of the basal-midinferolateral, inferior, and inferoseptal myocardium. Features are consistent with a pseudonormal left ventricular filling pattern, with concomitant abnormal relaxation and increased filling pressure (grade 2 diastolic  dysfunction). - Mitral valve: Mildly thickened leaflets . There was mild to moderate regurgitation. - Left atrium: The atrium was mildly dilated. - Right atrium: Central venous pressure (est): 8 mm Hg. - Atrial septum: No defect or patent foramen ovale was identified. - Tricuspid valve: There was trivial regurgitation. - Pulmonary arteries: Systolic pressure could not be accurately estimated. - Pericardium, extracardiac: There was no pericardial effusion.  Cardiac Catheterization 11/20/2017:  Prox Cx to Mid Cx lesion is 100% stenosed.  Mid LM to Dist LM lesion is 75% stenosed.  Ost Cx to Prox Cx lesion is 50% stenosed.  Ost LAD to Prox LAD lesion is 40% stenosed.  Mid LAD lesion is 40% stenosed.  There is severe left ventricular systolic dysfunction.  The left ventricular ejection fraction is 25-35% by visual estimate.  There is no aortic valve stenosis.  LV end diastolic pressure is normal.  Prox RCA lesion is 90% stenosed.  Ao sat 93%; PA sat 62%; CO 3.5 L/min; CI 2.29; mean PCWP 2 mm Hg. Normal right heart pressures.  Severe left main disease.   Severely decreased LV function. Normal LVEDP.   Resume heparin 6 hours post sheath pull.   Vascular arterial studies 11/22/2017: Final Interpretation: Right Carotid: Velocities in the right ICA are consistent with a 1-39% stenosis.  Left Carotid: Velocities in the left ICA are consistent with a 60-79% stenosis. Vertebrals: Right vertebral artery was patent with antegrade flow. No evidence       of flow detected in the left vertebral artery. Subclavians:  Right ABI: Resting right ankle-brachial index indicates moderate right lower extremity arterial disease. Left ABI: Resting left ankle-brachial index indicates mild left lower extremity arterial disease. Right Upper Extremity: Abnormal PPG waveforms with ulnar compression suggest an incomplete palmar arch. right ulnar decreased greater than 50% with  compression. Left Upper Extremity: Abnormal PPG waveforms with ulnar compression suggest an incomplete palmar arch. Left ulnar obliterated with compression.  Assessment and Plan:  1.  Multivessel CAD status post CABG in February.  No active angina at this time.  She is no longer on antiplatelet regimen given use of Xarelto for stroke prophylaxis with PAF.  2.  Ischemic cardiomyopathy with LVEF 35 to 40% by assessment in February.  She reports no leg swelling, orthopnea or PND.  Continues on low-dose Lasix and Coreg.  Blood pressure is up today, but has generally run on the low side.  If trend continues would add low-dose Entresto if possible, presuming LVEF remains reduced.  Follow-up echocardiogram will be obtained.  3.  Carotid artery and peripheral arterial disease based on Dopplers from February of this year.  We will arrange follow-up with [facility].  4.  Mixed hyperlipidemia, continues on Crestor.  Would aim for follow-up lipid panel around the time of her next visit.  5.  Paroxysmal atrial fibrillation documented by ILR interpretation.  She is asymptomatic at this time and is on Xarelto for stroke prophylaxis.  Recent lab work reviewed.  Current medicines were reviewed with the patient today.   Orders Placed This Encounter  Procedures  . Ambulatory referral to Vascular Surgery  . ECHOCARDIOGRAM COMPLETE    Disposition: Follow-up in 3 months.  Signed, Satira Sark, MD, [facility] 05/08/2018 12:08 PM    [facility] [address] [place], [place] [address] Phone: [phone]; Fax: [phone]

## 2018-05-08 ENCOUNTER — Ambulatory Visit: Payer: BLUE CROSS/BLUE SHIELD | Admitting: Cardiology

## 2018-05-08 ENCOUNTER — Encounter: Payer: Self-pay | Admitting: Cardiology

## 2018-05-08 VITALS — BP 146/90 | HR 88 | Ht 61.0 in | Wt 114.0 lb

## 2018-05-08 DIAGNOSIS — I480 Paroxysmal atrial fibrillation: Secondary | ICD-10-CM | POA: Diagnosis not present

## 2018-05-08 DIAGNOSIS — E782 Mixed hyperlipidemia: Secondary | ICD-10-CM

## 2018-05-08 DIAGNOSIS — I6523 Occlusion and stenosis of bilateral carotid arteries: Secondary | ICD-10-CM | POA: Diagnosis not present

## 2018-05-08 DIAGNOSIS — I255 Ischemic cardiomyopathy: Secondary | ICD-10-CM

## 2018-05-08 DIAGNOSIS — I739 Peripheral vascular disease, unspecified: Secondary | ICD-10-CM

## 2018-05-08 NOTE — Patient Instructions (Addendum)
Your physician wants you to follow-up in:2-3 months with Bernerd Pho PA-C   Your physician recommends that you continue on your current medications as directed. Please refer to the Current Medication list given to you today.    If you need a refill on your cardiac medications before your next appointment, please call your pharmacy.    You have been referred to  Vascular surgery, they will call you with apt  [phone]     Your physician has requested that you have an echocardiogram. Echocardiography is a painless test that uses sound waves to create images of your heart. It provides your doctor with information about the size and shape of your heart and how well your heart's chambers and valves are working. This procedure takes approximately one hour. There are no restrictions for this procedure.      Thank you for choosing [facility] !

## 2018-05-09 ENCOUNTER — Ambulatory Visit (HOSPITAL_COMMUNITY): Payer: BLUE CROSS/BLUE SHIELD

## 2018-05-09 ENCOUNTER — Other Ambulatory Visit: Payer: Self-pay

## 2018-05-09 DIAGNOSIS — I739 Peripheral vascular disease, unspecified: Secondary | ICD-10-CM

## 2018-05-09 DIAGNOSIS — I6523 Occlusion and stenosis of bilateral carotid arteries: Secondary | ICD-10-CM

## 2018-05-11 ENCOUNTER — Ambulatory Visit (HOSPITAL_COMMUNITY): Payer: BLUE CROSS/BLUE SHIELD

## 2018-05-14 ENCOUNTER — Ambulatory Visit (INDEPENDENT_AMBULATORY_CARE_PROVIDER_SITE_OTHER): Payer: BLUE CROSS/BLUE SHIELD | Admitting: *Deleted

## 2018-05-14 ENCOUNTER — Ambulatory Visit (HOSPITAL_COMMUNITY): Payer: BLUE CROSS/BLUE SHIELD

## 2018-05-14 DIAGNOSIS — I63512 Cerebral infarction due to unspecified occlusion or stenosis of left middle cerebral artery: Secondary | ICD-10-CM | POA: Diagnosis not present

## 2018-05-15 ENCOUNTER — Ambulatory Visit (HOSPITAL_COMMUNITY)
Admission: RE | Admit: 2018-05-15 | Discharge: 2018-05-15 | Disposition: A | Discharge status: Home / Self Care | Payer: BLUE CROSS/BLUE SHIELD | Source: Ambulatory Visit | Attending: Cardiology | Admitting: Cardiology

## 2018-05-15 DIAGNOSIS — I110 Hypertensive heart disease with heart failure: Secondary | ICD-10-CM | POA: Diagnosis not present

## 2018-05-15 DIAGNOSIS — Z8673 Personal history of transient ischemic attack (TIA), and cerebral infarction without residual deficits: Secondary | ICD-10-CM | POA: Insufficient documentation

## 2018-05-15 DIAGNOSIS — E785 Hyperlipidemia, unspecified: Secondary | ICD-10-CM | POA: Insufficient documentation

## 2018-05-15 DIAGNOSIS — I6602 Occlusion and stenosis of left middle cerebral artery: Secondary | ICD-10-CM | POA: Diagnosis not present

## 2018-05-15 DIAGNOSIS — I340 Nonrheumatic mitral (valve) insufficiency: Secondary | ICD-10-CM | POA: Diagnosis not present

## 2018-05-15 DIAGNOSIS — Z951 Presence of aortocoronary bypass graft: Secondary | ICD-10-CM | POA: Insufficient documentation

## 2018-05-15 DIAGNOSIS — I480 Paroxysmal atrial fibrillation: Secondary | ICD-10-CM | POA: Insufficient documentation

## 2018-05-15 DIAGNOSIS — I255 Ischemic cardiomyopathy: Secondary | ICD-10-CM | POA: Diagnosis not present

## 2018-05-15 DIAGNOSIS — I252 Old myocardial infarction: Secondary | ICD-10-CM | POA: Insufficient documentation

## 2018-05-15 DIAGNOSIS — I5030 Unspecified diastolic (congestive) heart failure: Secondary | ICD-10-CM | POA: Diagnosis not present

## 2018-05-15 NOTE — Progress Notes (Signed)
*  PRELIMINARY RESULTS* Echocardiogram 2D Echocardiogram has been performed.  [name] 05/15/2018, 11:52 AM

## 2018-05-15 NOTE — Progress Notes (Signed)
[facility] Summary Report / Loop Recorder 

## 2018-05-16 ENCOUNTER — Ambulatory Visit (HOSPITAL_COMMUNITY): Payer: BLUE CROSS/BLUE SHIELD

## 2018-05-17 ENCOUNTER — Telehealth: Payer: Self-pay

## 2018-05-17 DIAGNOSIS — Z79899 Other long term (current) drug therapy: Secondary | ICD-10-CM

## 2018-05-17 MED ORDER — SACUBITRIL-VALSARTAN 24-26 MG PO TABS: 1.0000 | ORAL_TABLET | Freq: Two times a day (BID) | ORAL | 11 refills | Status: DC

## 2018-05-17 NOTE — Telephone Encounter (Signed)
-----   Message from Satira Sark, MD sent at 05/15/2018 12:46 PM EDT ----- Results reviewed.  LVEF remains reduced at 35 to 40%.  Let's try and add Entresto 24/26 mg twice daily if blood pressure allows.  She would need a BMET in 2 weeks after starting. A copy of this test should be forwarded to [facility], [name], DO.

## 2018-05-17 NOTE — Telephone Encounter (Signed)
Pt will start Entresto 24/26 mg BID, I faxed Free trial 30 day card info with ID to pharmacy, pt also will get repeat bmet in 2 weeks, I mailed lab slips

## 2018-05-18 ENCOUNTER — Ambulatory Visit (HOSPITAL_COMMUNITY): Payer: BLUE CROSS/BLUE SHIELD

## 2018-05-21 ENCOUNTER — Ambulatory Visit (HOSPITAL_COMMUNITY)
Admission: RE | Admit: 2018-05-21 | Discharge: 2018-05-21 | Disposition: A | Discharge status: Home / Self Care | Payer: BLUE CROSS/BLUE SHIELD | Source: Ambulatory Visit | Attending: Vascular Surgery | Admitting: Vascular Surgery

## 2018-05-21 ENCOUNTER — Ambulatory Visit (HOSPITAL_COMMUNITY): Payer: BLUE CROSS/BLUE SHIELD

## 2018-05-21 ENCOUNTER — Ambulatory Visit (INDEPENDENT_AMBULATORY_CARE_PROVIDER_SITE_OTHER): Payer: BLUE CROSS/BLUE SHIELD | Admitting: Vascular Surgery

## 2018-05-21 ENCOUNTER — Encounter: Payer: Self-pay | Admitting: Vascular Surgery

## 2018-05-21 VITALS — BP 132/84 | HR 84 | Temp 97.8°F | Resp 18 | Ht 61.0 in | Wt 115.0 lb

## 2018-05-21 DIAGNOSIS — I6523 Occlusion and stenosis of bilateral carotid arteries: Secondary | ICD-10-CM | POA: Insufficient documentation

## 2018-05-21 DIAGNOSIS — M25562 Pain in left knee: Secondary | ICD-10-CM | POA: Diagnosis not present

## 2018-05-21 DIAGNOSIS — I739 Peripheral vascular disease, unspecified: Secondary | ICD-10-CM | POA: Insufficient documentation

## 2018-05-21 DIAGNOSIS — M25561 Pain in right knee: Secondary | ICD-10-CM | POA: Diagnosis not present

## 2018-05-21 NOTE — Progress Notes (Signed)
[facility] [place]  Patient name: [name] MRN: [id number] DOB: May 03, 1961 Sex: female  REASON FOR CONSULT: Evaluation of carotid stenosis and possible peripheral vascular occlusive disease  Seen in our [place] office  HPI: [name] is a 57 y.o. female, who is here today for evaluation.  She is here today with her husband.  Has had a very difficult year.  In November 2018 she had a major stroke.  In reviewing her data she was intubated with major right-sided weakness.  She underwent emergent neuro interventional radiology treatment of a MCA occlusion.  CT angiogram suggested moderate to severe bilateral carotid stenosis.  I reviewed her actual images from her carotid arteriogram showing a smooth moderate to severe stenosis in her left internal carotid artery.  She had successful treatment of her MCA occlusion.  She subsequently was admitted to rehab.  She continues to have difficulty with near flaccid right arm.  She did have speech difficulty which is markedly improved.  She does walk with a 4 prong cane due to some foot drop on the right.  She then was admitted in [place] 2019 with unstable cardiac disease and underwent coronary artery bypass grafting.  Has had ischemic cardiomyopathy.  She does report coolness and sometimes discoloration in her right leg.  No tissue loss in her lower extremities.  Past Medical History:  Diagnosis Date  . Arthritis   . CAD (coronary artery disease)    a. s/p NSTEMI in 10/2017 with cath showing LM disease --> s/p CABG on 11/23/2017 with LIMA-LAD and SVG-OM.   [name] Hay fever   . Ischemic cardiomyopathy    a. EF 35-40% by echo in 10/2017  . NSTEMI (non-ST elevated myocardial infarction) (HCC)   . PAF (paroxysmal atrial fibrillation) (HCC)    Diagnosed by ILR  . Pneumonia 11/16/2017  . Stroke [facility]) 08/23/2017   Left MCA infarct status post TPA and thrombectomy    Family History    Problem Relation Age of Onset  . Heart attack Father   . Hypertension Mother   . Hyperlipidemia Mother   . Diabetes type II Mother   . Hypertension Brother   . Breast cancer Cousin 89    SOCIAL HISTORY: Social History   Socioeconomic History  . Marital status: Married    Spouse name: Not on file  . Number of children: 2  . Years of education: Not on file  . Highest education level: Not on file  Occupational History  . Not on file  Social Needs  . Financial resource strain: Not on file  . Food insecurity:    Worry: Not on file    Inability: Not on file  . Transportation needs:    Medical: Not on file    Non-medical: Not on file  Tobacco Use  . Smoking status: Former Smoker    Packs/day: 0.12    Years: 37.00    Pack years: 4.44    Types: Cigarettes    Last attempt to quit: 08/21/2017    Years since quitting: 0.7  . Smokeless tobacco: Never Used  Substance and Sexual Activity  . Alcohol use: No    Frequency: Never  . Drug use: No  . Sexual activity: Not on file  Lifestyle  . Physical activity:    Days per week: Not on file    Minutes per session: Not on file  . Stress: Not on file  Relationships  . Social connections:    Talks  on phone: Patient refused    Gets together: Patient refused    Attends religious service: Patient refused    Active member of club or organization: Patient refused    Attends meetings of clubs or organizations: Patient refused    Relationship status: Patient refused  . Intimate partner violence:    Fear of current or ex partner: Patient refused    Emotionally abused: Patient refused    Physically abused: Patient refused    Forced sexual activity: Patient refused  Other Topics Concern  . Not on file  Social History Narrative   Married. 2 children.    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    Walks with cane or wheelchair.    Former smoker.    Drinks caffeine.    Smoke alarm in the home.   Firearms in the home.    Wears her seatbelt.     Feels safe In her relationships.     Allergies  Allergen Reactions  . Lopressor [Metoprolol Tartrate] Swelling    Angioedema  . Lipitor [Atorvastatin] Diarrhea    Current Outpatient Medications  Medication Sig Dispense Refill  . baclofen (LIORESAL) 10 MG tablet Take 1 tablet (10 mg total) by mouth 3 (three) times daily. 90 each 1  . calcium carbonate (TUMS - DOSED IN MG ELEMENTAL CALCIUM) 500 MG chewable tablet Chew 1 tablet by mouth daily.    . carvedilol (COREG) 6.25 MG tablet Take 3.125 mg by mouth 2 (two) times daily with a meal.    . cholecalciferol (VITAMIN D) 1000 units tablet Take 1,000 Units by mouth daily.    [name] escitalopram (LEXAPRO) 10 MG tablet Take 1 tablet (10 mg total) by mouth daily. 90 tablet 1  . famotidine (PEPCID) 20 MG tablet Take 20 mg by mouth 2 (two) times daily.    . ferrous sulfate 325 (65 FE) MG tablet Take 325 mg by mouth daily with breakfast.    . furosemide (LASIX) 20 MG tablet Take 1 tablet (20 mg total) by mouth daily. 90 tablet 1  . levocetirizine (XYZAL) 5 MG tablet Take 1 tablet (5 mg total) by mouth every evening. 90 tablet 1  . lisinopril (PRINIVIL,ZESTRIL) 2.5 MG tablet Take 2.5 mg by mouth daily.    . Potassium Chloride ER 20 MEQ TBCR Take 20 mEq by mouth daily. 90 tablet 1  . ranitidine (ZANTAC) 150 MG tablet Take 1 tablet (150 mg total) by mouth 2 (two) times daily. 180 tablet 1  . rivaroxaban (XARELTO) 20 MG TABS tablet Take 1 tablet (20 mg total) by mouth daily with supper. 30 tablet 6  . rosuvastatin (CRESTOR) 20 MG tablet Take 1 tablet (20 mg total) by mouth daily. 90 tablet 3  . sacubitril-valsartan (ENTRESTO) 24-26 MG Take 1 tablet by mouth 2 (two) times daily. 60 tablet 11  . triamcinolone cream (KENALOG) 0.1 % Apply 1 application topically 2 (two) times daily. 30 g 2  . vitamin B-12 (CYANOCOBALAMIN) 100 MCG tablet Take 100 mcg by mouth daily.     No current facility-administered medications for this visit.     REVIEW OF SYSTEMS:   [X]  denotes positive finding, [ ]  denotes negative finding Cardiac  Comments:  Chest pain or chest pressure:    Shortness of breath upon exertion: x   Short of breath when lying flat:    Irregular heart rhythm:        Vascular    Pain in calf, thigh, or hip brought on by ambulation:    Pain  in feet at night that wakes you up from your sleep:     Blood clot in your veins:    Leg swelling:         Pulmonary    Oxygen at home:    Productive cough:     Wheezing:         Neurologic    Sudden weakness in arms or legs:  x   Sudden numbness in arms or legs:  x   Sudden onset of difficulty speaking or slurred speech: x   Temporary loss of vision in one eye:     Problems with dizziness:         Gastrointestinal    Blood in stool:     Vomited blood:         Genitourinary    Burning when urinating:     Blood in urine:        Psychiatric    Major depression:         Hematologic    Bleeding problems:    Problems with blood clotting too easily:        Skin    Rashes or ulcers: x       Constitutional    Fever or chills:      PHYSICAL EXAM: Vitals:   05/21/18 1313  BP: 132/84  Pulse: 84  Resp: 18  Temp: 97.8 F (36.6 C)  TempSrc: Temporal  Weight: 115 lb (52.2 kg)  Height: 5\' 1"  (1.549 m)    GENERAL: The patient is a well-nourished female, in no acute distress. The vital signs are documented above. CARDIOVASCULAR: Carotid arteries without bruits bilaterally.  2+ radial 2+ femoral and 2+ dorsalis pedis pulses bilaterally PULMONARY: There is good air exchange  ABDOMEN: Soft and non-tender  MUSCULOSKELETAL: There are no major deformities or cyanosis. NEUROLOGIC: Near flaccid right arm.  Is able to lift her right shoulder.  Walks with a 4 pronged cane due to some foot drop on the right SKIN: There are no ulcers or rashes noted. PSYCHIATRIC: The patient has a normal affect.  DATA:  Basis studies today at [facility] reviewed.  This demonstrates 50 to 60%  stenosis in her internal carotid arteries bilaterally.  Noninvasive lower extremity studies reveal normal ankle arm index and waveforms bilaterally  MEDICAL ISSUES: I reviewed these studies with the patient.  Her major left brain stroke was presumed to be related to embolus from atrial fibrillation.  She does have stable moderate to severe carotid stenoses bilaterally.  Recommended repeat imaging with duplex in 6 months.  She knows to report immediately should she develop any new neurologic deficits.  Regarding her lower extremity symptoms, I do feel this is related to post stroke difficulty and not related to atrial insufficiency.  Would not recommend any further evaluation with this.  Larina Earthly, MD FACS [facility] Tel [phone] Pager [phone]

## 2018-05-23 ENCOUNTER — Ambulatory Visit (HOSPITAL_COMMUNITY): Payer: BLUE CROSS/BLUE SHIELD

## 2018-05-25 ENCOUNTER — Ambulatory Visit (HOSPITAL_COMMUNITY): Payer: BLUE CROSS/BLUE SHIELD

## 2018-05-29 LAB — CUP PACEART REMOTE DEVICE CHECK
Date Time Interrogation Session: 20190716203557
Implantable Pulse Generator Implant Date: 20181203

## 2018-05-30 ENCOUNTER — Ambulatory Visit (HOSPITAL_COMMUNITY): Payer: BLUE CROSS/BLUE SHIELD

## 2018-06-01 ENCOUNTER — Other Ambulatory Visit (HOSPITAL_COMMUNITY)
Admission: RE | Admit: 2018-06-01 | Discharge: 2018-06-01 | Disposition: A | Discharge status: Home / Self Care | Payer: BLUE CROSS/BLUE SHIELD | Source: Ambulatory Visit | Attending: Cardiology | Admitting: Cardiology

## 2018-06-01 ENCOUNTER — Ambulatory Visit (HOSPITAL_COMMUNITY): Payer: BLUE CROSS/BLUE SHIELD

## 2018-06-01 DIAGNOSIS — Z79899 Other long term (current) drug therapy: Secondary | ICD-10-CM | POA: Insufficient documentation

## 2018-06-01 LAB — BASIC METABOLIC PANEL
ANION GAP: 6 (ref 5–15)
BUN: 19 mg/dL (ref 6–20)
CALCIUM: 9.5 mg/dL (ref 8.9–10.3)
CO2: 29 mmol/L (ref 22–32)
CREATININE: 0.62 mg/dL (ref 0.44–1.00)
Chloride: 103 mmol/L (ref 98–111)
Glucose, Bld: 95 mg/dL (ref 70–99)
Potassium: 4 mmol/L (ref 3.5–5.1)
SODIUM: 138 mmol/L (ref 135–145)

## 2018-06-04 ENCOUNTER — Ambulatory Visit (HOSPITAL_COMMUNITY): Payer: BLUE CROSS/BLUE SHIELD

## 2018-06-06 ENCOUNTER — Ambulatory Visit (HOSPITAL_COMMUNITY): Payer: BLUE CROSS/BLUE SHIELD

## 2018-06-08 ENCOUNTER — Ambulatory Visit (HOSPITAL_COMMUNITY): Payer: BLUE CROSS/BLUE SHIELD

## 2018-06-15 ENCOUNTER — Ambulatory Visit (INDEPENDENT_AMBULATORY_CARE_PROVIDER_SITE_OTHER): Payer: BLUE CROSS/BLUE SHIELD | Admitting: *Deleted

## 2018-06-15 DIAGNOSIS — I63512 Cerebral infarction due to unspecified occlusion or stenosis of left middle cerebral artery: Secondary | ICD-10-CM | POA: Diagnosis not present

## 2018-06-16 NOTE — Progress Notes (Signed)
[facility] Summary Report / Loop Recorder 

## 2018-06-17 LAB — CUP PACEART REMOTE DEVICE CHECK
MDC IDC PG IMPLANT DT: 20181203
MDC IDC SESS DTM: 20190920214151

## 2018-06-18 LAB — CUP PACEART REMOTE DEVICE CHECK
Implantable Pulse Generator Implant Date: 20181203
MDC IDC SESS DTM: 20190818213906

## 2018-06-20 ENCOUNTER — Encounter: Payer: Self-pay | Admitting: Cardiology

## 2018-06-20 ENCOUNTER — Ambulatory Visit: Payer: BLUE CROSS/BLUE SHIELD | Admitting: Cardiology

## 2018-06-20 VITALS — BP 124/78 | HR 82 | Ht 61.0 in | Wt 115.0 lb

## 2018-06-20 DIAGNOSIS — I5022 Chronic systolic (congestive) heart failure: Secondary | ICD-10-CM | POA: Diagnosis not present

## 2018-06-20 DIAGNOSIS — I428 Other cardiomyopathies: Secondary | ICD-10-CM

## 2018-06-20 DIAGNOSIS — I480 Paroxysmal atrial fibrillation: Secondary | ICD-10-CM | POA: Diagnosis not present

## 2018-06-20 DIAGNOSIS — I25810 Atherosclerosis of coronary artery bypass graft(s) without angina pectoris: Secondary | ICD-10-CM

## 2018-06-20 LAB — CUP PACEART INCLINIC DEVICE CHECK
Date Time Interrogation Session: 20190925132015
Implantable Pulse Generator Implant Date: 20181203

## 2018-06-20 NOTE — Patient Instructions (Signed)
Medication Instructions:  Your physician recommends that you continue on your current medications as directed. Please refer to the Current Medication list given to you today.  *If you need a refill on your cardiac medications before your next appointment, please call your pharmacy*  Labwork: None ordered  Testing/Procedures: None ordered  Follow-Up: Remote monitoring is used to monitor your Pacemaker or ICD from home. This monitoring reduces the number of office visits required to check your device to one time per year. It allows [place] to keep an eye on the functioning of your device to ensure it is working properly. You are scheduled for a device check from home on 07/18/2018. You may send your transmission at any time that day. If you have a wireless device, the transmission will be sent automatically. After your physician reviews your transmission, you will receive a postcard with your next transmission date.  Your physician wants you to follow-up in: 1 year with Dr. Curt Bears.  You will receive a reminder letter in the mail two months in advance. If you don't receive a letter, please call our office to schedule the follow-up appointment.  Thank you for choosing [facility]!!   Trinidad Curet, RN [phone]  Any Other Special Instructions Will Be Listed Below (If Applicable).

## 2018-06-20 NOTE — Progress Notes (Signed)
Electrophysiology Office Note   Date:  06/20/2018   ID:  ORIEL GAL, DOB January 21, 1961, MRN [id number]  PCP:  Natalia Leatherwood, DO  Cardiologist:  Diona Browner Primary Electrophysiologist:  Alexander Aument Jorja Loa, MD    No chief complaint on file.    History of Present Illness: [name] is a 57 y.o. female who is being seen today for the evaluation of atrial fibrillation at the request of Nona Dell. Presenting today for electrophysiology evaluation.  She has a past history of CVA and tobacco abuse.  She was admitted [facility] 11/16/17 for worsening dyspnea on exertion was found to have a non-STEMI with a peak troponin of 10.5 and was transferred to [facility].  Echo showed an EF of 35-40% with diffuse hypokinesis and akinesis of the basal mid inferior lateral, inferior inferoseptal wall.  Cath showed severe left main disease, circumflex stenosis, ostial LAD and proximal RCA stenosis.  She underwent CABG 11/23/17 with a LIMA to the LAD and vein to the OM.  Today, denies symptoms of palpitations, chest pain, shortness of breath, orthopnea, PND, lower extremity edema, claudication, dizziness, presyncope, syncope, bleeding, or neurologic sequela. The patient is tolerating medications without difficulties.  Overall she is feeling well.  She is unfortunately continued to have problems with her right arm and right foot.  She is walking with a cane.  She does feel like this is slowly improving.   Past Medical History:  Diagnosis Date  . Arthritis   . CAD (coronary artery disease)    a. s/p NSTEMI in 10/2017 with cath showing LM disease --> s/p CABG on 11/23/2017 with LIMA-LAD and SVG-OM.   [name] Hay fever   . Ischemic cardiomyopathy    a. EF 35-40% by echo in 10/2017  . NSTEMI (non-ST elevated myocardial infarction) (HCC)   . PAF (paroxysmal atrial fibrillation) (HCC)    Diagnosed by ILR  . Pneumonia 11/16/2017  . Stroke [facility]) 08/23/2017   Left MCA infarct status post TPA and  thrombectomy   Past Surgical History:  Procedure Laterality Date  . CORONARY ARTERY BYPASS GRAFT N/A 11/23/2017   Procedure: CORONARY ARTERY BYPASS GRAFTING (CABG) x 2, ON PUMP, USING LEFT INTERNAL MAMMARY ARTERY AND RIGHT GREATER SAPHENOUS VEIN HARVESTED ENDOSCOPICALLY;  Surgeon: Alleen Borne, MD;  Location: [facility] OR;  Service: Open Heart Surgery;  Laterality: N/A;  . CRYOABLATION     cervical  . IR ANGIO INTRA EXTRACRAN SEL COM CAROTID INNOMINATE UNI R MOD SED  08/21/2017  . IR ANGIO VERTEBRAL SEL SUBCLAVIAN INNOMINATE UNI R MOD SED  08/21/2017  . IR PERCUTANEOUS ART THROMBECTOMY/INFUSION INTRACRANIAL INC DIAG ANGIO  08/21/2017  . IR RADIOLOGIST EVAL & MGMT  10/30/2017  . LOOP RECORDER INSERTION N/A 08/28/2017   Procedure: LOOP RECORDER INSERTION;  Surgeon: Regan Lemming, MD;  Location: [facility] INVASIVE CV LAB;  Service: Cardiovascular;  Laterality: N/A;  . RADIOLOGY WITH ANESTHESIA N/A 08/21/2017   Procedure: RADIOLOGY WITH ANESTHESIA;  Surgeon: Julieanne Cotton, MD;  Location: [facility] OR;  Service: Radiology;  Laterality: N/A;  . RIGHT/LEFT HEART CATH AND CORONARY ANGIOGRAPHY N/A 11/20/2017   Procedure: RIGHT/LEFT HEART CATH AND CORONARY ANGIOGRAPHY;  Surgeon: Corky Crafts, MD;  Location: [facility] INVASIVE CV LAB;  Service: Cardiovascular;  Laterality: N/A;  . TEE WITHOUT CARDIOVERSION N/A 08/28/2017   Procedure: TRANSESOPHAGEAL ECHOCARDIOGRAM (TEE);  Surgeon: Pricilla Riffle, MD;  Location: [facility] ENDOSCOPY;  Service: Cardiovascular;  Laterality: N/A;  . TEE WITHOUT CARDIOVERSION N/A 11/23/2017   Procedure: TRANSESOPHAGEAL  ECHOCARDIOGRAM (TEE);  Surgeon: Alleen Borne, MD;  Location: [facility] OR;  Service: Open Heart Surgery;  Laterality: N/A;  . TUBAL LIGATION       Current Outpatient Medications  Medication Sig Dispense Refill  . baclofen (LIORESAL) 10 MG tablet Take 1 tablet (10 mg total) by mouth 3 (three) times daily. 90 each 1  . calcium carbonate (TUMS - DOSED IN MG ELEMENTAL CALCIUM) 500 MG  chewable tablet Chew 1 tablet by mouth daily.    . carvedilol (COREG) 6.25 MG tablet Take 3.125 mg by mouth 2 (two) times daily with a meal.    . cholecalciferol (VITAMIN D) 1000 units tablet Take 1,000 Units by mouth daily.    [name] escitalopram (LEXAPRO) 10 MG tablet Take 1 tablet (10 mg total) by mouth daily. 90 tablet 1  . famotidine (PEPCID) 20 MG tablet Take 20 mg by mouth 2 (two) times daily.    . ferrous sulfate 325 (65 FE) MG tablet Take 325 mg by mouth daily with breakfast.    . furosemide (LASIX) 20 MG tablet Take 1 tablet (20 mg total) by mouth daily. 90 tablet 1  . levocetirizine (XYZAL) 5 MG tablet Take 1 tablet (5 mg total) by mouth every evening. 90 tablet 1  . lisinopril (PRINIVIL,ZESTRIL) 2.5 MG tablet Take 2.5 mg by mouth daily.    . Potassium Chloride ER 20 MEQ TBCR Take 20 mEq by mouth daily. 90 tablet 1  . ranitidine (ZANTAC) 150 MG tablet Take 1 tablet (150 mg total) by mouth 2 (two) times daily. 180 tablet 1  . rivaroxaban (XARELTO) 20 MG TABS tablet Take 1 tablet (20 mg total) by mouth daily with supper. 30 tablet 6  . rosuvastatin (CRESTOR) 20 MG tablet Take 1 tablet (20 mg total) by mouth daily. 90 tablet 3  . sacubitril-valsartan (ENTRESTO) 24-26 MG Take 1 tablet by mouth 2 (two) times daily. 60 tablet 11  . triamcinolone cream (KENALOG) 0.1 % Apply 1 application topically 2 (two) times daily. 30 g 2  . vitamin B-12 (CYANOCOBALAMIN) 100 MCG tablet Take 100 mcg by mouth daily.     No current facility-administered medications for this visit.     Allergies:   Lopressor [metoprolol tartrate] and Lipitor [atorvastatin]   Social History:  The patient  reports that she quit smoking about 9 months ago. Her smoking use included cigarettes. She has a 4.44 pack-year smoking history. She has never used smokeless tobacco. She reports that she does not drink alcohol or use drugs.   Family History:  The patient's family history includes Breast cancer (age of onset: 36) in her cousin;  Diabetes type II in her mother; Heart attack in her father; Hyperlipidemia in her mother; Hypertension in her brother and mother.    ROS:  Please see the history of present illness.   Otherwise, review of systems is positive for easy bruising.   All other systems are reviewed and negative.   PHYSICAL EXAM: VS:  BP 124/78   Pulse 82   Ht 5\' 1"  (1.549 m)   Wt 115 lb (52.2 kg)   LMP  (LMP Unknown)   BMI 21.73 kg/m  , BMI Body mass index is 21.73 kg/m. GEN: Well nourished, well developed, in no acute distress  HEENT: normal  Neck: no JVD, carotid bruits, or masses Cardiac: RRR; no murmurs, rubs, or gallops,no edema  Respiratory:  clear to auscultation bilaterally, normal work of breathing GI: soft, nontender, nondistended, + BS MS: no deformity or atrophy  Skin: warm and dry Neuro: Weakness of right arm and leg Psych: euthymic mood, full affect  EKG:  EKG is not ordered today. Personal review of the ekg ordered 03/16/18 shows SR, rate 109   Recent Labs: 11/18/2017: B Natriuretic Peptide 1,276.7 11/24/2017: Magnesium 2.3 11/30/2017: ALT 10 02/02/2018: TSH 1.98 03/15/2018: Hemoglobin 14.5; Platelets 364 06/01/2018: BUN 19; Creatinine, Ser 0.62; Potassium 4.0; Sodium 138    Lipid Panel     Component Value Date/Time   CHOL 237 (H) 08/22/2017 0811   TRIG 295 (H) 08/22/2017 0811   TRIG 301 (H) 08/22/2017 0811   HDL 35 (L) 08/22/2017 0811   CHOLHDL 6.8 08/22/2017 0811   VLDL 59 (H) 08/22/2017 0811   LDLCALC 143 (H) 08/22/2017 0811     Wt Readings from Last 3 Encounters:  06/20/18 115 lb (52.2 kg)  05/21/18 115 lb (52.2 kg)  05/08/18 114 lb (51.7 kg)      Other studies Reviewed: Additional studies/ records that were reviewed today include: TTE 11/16/17  Review of the above records today demonstrates:  - Left ventricle: The cavity size was mildly dilated. Wall   thickness was increased in a pattern of mild LVH. Systolic   function was moderately reduced. The estimated ejection  fraction   was in the range of 35% to 40%. Diffuse hypokinesis. There is   akinesis of the basal-midinferolateral, inferior, and   inferoseptal myocardium. Features are consistent with a   pseudonormal left ventricular filling pattern, with concomitant   abnormal relaxation and increased filling pressure (grade 2   diastolic dysfunction). - Mitral valve: Mildly thickened leaflets . There was mild to   moderate regurgitation. - Left atrium: The atrium was mildly dilated. - Right atrium: Central venous pressure (est): 8 mm Hg. - Atrial septum: No defect or patent foramen ovale was identified. - Tricuspid valve: There was trivial regurgitation. - Pulmonary arteries: Systolic pressure could not be accurately   estimated. - Pericardium, extracardiac: There was no pericardial effusion.   ASSESSMENT AND PLAN:  1.  Coronary artery disease status post two-vessel bypass: No current chest pain.  She is not had any chest pain since her operation.  No changes.  2.  Chronic systolic and diastolic heart failure due to ischemic cardiomyopathy: No obvious signs of volume overload.  No changes.  3.  Paroxysmal atrial fibrillation: Likely the cause of her CVA.  Is currently on Xarelto.  No further symptoms.  This patients CHA2DS2-VASc Score and unadjusted Ischemic Stroke Rate (% per year) is equal to 7.2 % stroke rate/year from a score of 5  Above score calculated as 1 point each if present [CHF, HTN, DM, Vascular=MI/PAD/Aortic Plaque, Age if 65-74, or Female] Above score calculated as 2 points each if present [Age > 75, or Stroke/TIA/TE]    Current medicines are reviewed at length with the patient today.   The patient does not have concerns regarding her medicines.  The following changes were made today: None  Labs/ tests ordered today include:  No orders of the defined types were placed in this encounter.    Disposition:   FU with Simcha Farrington 12 months  Signed, Jadelynn Boylan Jorja Loa, MD    06/20/2018 10:12 AM     [facility] [address] [place] [address] [phone] (office) [phone] (fax)

## 2018-07-04 ENCOUNTER — Encounter
Payer: BLUE CROSS/BLUE SHIELD | Attending: Physical Medicine & Rehabilitation | Admitting: Physical Medicine & Rehabilitation

## 2018-07-04 ENCOUNTER — Encounter: Payer: Self-pay | Admitting: Physical Medicine & Rehabilitation

## 2018-07-04 VITALS — BP 103/61 | HR 84 | Ht 60.0 in | Wt 116.4 lb

## 2018-07-04 DIAGNOSIS — I69391 Dysphagia following cerebral infarction: Secondary | ICD-10-CM | POA: Diagnosis not present

## 2018-07-04 DIAGNOSIS — Z833 Family history of diabetes mellitus: Secondary | ICD-10-CM | POA: Diagnosis not present

## 2018-07-04 DIAGNOSIS — Z888 Allergy status to other drugs, medicaments and biological substances status: Secondary | ICD-10-CM | POA: Insufficient documentation

## 2018-07-04 DIAGNOSIS — Z87891 Personal history of nicotine dependence: Secondary | ICD-10-CM | POA: Diagnosis not present

## 2018-07-04 DIAGNOSIS — K5900 Constipation, unspecified: Secondary | ICD-10-CM | POA: Diagnosis not present

## 2018-07-04 DIAGNOSIS — I69351 Hemiplegia and hemiparesis following cerebral infarction affecting right dominant side: Secondary | ICD-10-CM | POA: Insufficient documentation

## 2018-07-04 DIAGNOSIS — G8191 Hemiplegia, unspecified affecting right dominant side: Secondary | ICD-10-CM

## 2018-07-04 DIAGNOSIS — I63512 Cerebral infarction due to unspecified occlusion or stenosis of left middle cerebral artery: Secondary | ICD-10-CM | POA: Diagnosis not present

## 2018-07-04 DIAGNOSIS — I10 Essential (primary) hypertension: Secondary | ICD-10-CM

## 2018-07-04 DIAGNOSIS — R1310 Dysphagia, unspecified: Secondary | ICD-10-CM | POA: Insufficient documentation

## 2018-07-04 DIAGNOSIS — Z8249 Family history of ischemic heart disease and other diseases of the circulatory system: Secondary | ICD-10-CM | POA: Insufficient documentation

## 2018-07-04 DIAGNOSIS — M170 Bilateral primary osteoarthritis of knee: Secondary | ICD-10-CM | POA: Insufficient documentation

## 2018-07-04 DIAGNOSIS — G8110 Spastic hemiplegia affecting unspecified side: Secondary | ICD-10-CM | POA: Diagnosis not present

## 2018-07-04 DIAGNOSIS — R269 Unspecified abnormalities of gait and mobility: Secondary | ICD-10-CM

## 2018-07-04 DIAGNOSIS — Z951 Presence of aortocoronary bypass graft: Secondary | ICD-10-CM

## 2018-07-04 DIAGNOSIS — I639 Cerebral infarction, unspecified: Secondary | ICD-10-CM

## 2018-07-04 MED ORDER — BACLOFEN 10 MG PO TABS: 10.0000 mg | ORAL_TABLET | Freq: Three times a day (TID) | ORAL | 1 refills | Status: DC

## 2018-07-04 NOTE — Progress Notes (Signed)
Subjective:    Patient ID: [name], female    DOB: [birth year], 57 y.o.   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    Last clinic visit 05/02/18.  Since that visit, pt states she has not been doing HEP, but states she needs to start again. She states her braces cause her discomfort.  Denies falls. She saw Vascular and workup was negative. She uses a quad cane.  Denies falls. She had good benefit from Zilretta injections. BP is controlled.   Pain Inventory Average Pain 5 Pain Right Now 5 My pain is tingling  In the last 24 hours, has pain interfered with the following? General activity 5 Relation with others 5 Enjoyment of life 5 What TIME of day is your pain at its worst? morning Sleep (in general) Fair  Pain is worse with: bending and standing Pain improves with: rest, medication and injections Relief from Meds: 6  Mobility walk with assistance use a cane ability to climb steps?  no do you drive?  no Do you have any goals in this area?  yes  Function retired  Neuro/Psych trouble walking  Prior Studies Any changes since last visit?  no  Physicians involved in your care Any changes since last visit?  no   Family History  Problem Relation Age of Onset  . Heart attack Father   . Hypertension Mother   . Hyperlipidemia Mother   . Diabetes type II Mother   . Hypertension Brother   . Breast cancer Cousin 54   Social History   Socioeconomic History  . Marital status: Married    Spouse name: Not on file  . Number of children: 2  . Years of education: Not on file  . Highest education level: Not on file  Occupational History  . Not on file  Social Needs  . Financial resource strain: Not on file  . Food insecurity:    Worry: Not on file   Inability: Not on file  . Transportation needs:    Medical: Not on file    Non-medical: Not on file  Tobacco Use  . Smoking status: Former Smoker    Packs/day: 0.12    Years: 37.00    Pack years: 4.44    Types: Cigarettes    Last attempt to quit: 08/21/2017    Years since quitting: 0.8  . Smokeless tobacco: Never Used  Substance and Sexual Activity  . Alcohol use: No    Frequency: Never  . Drug use: No  . Sexual activity: Not on file  Lifestyle  . Physical activity:    Days per week: Not on file    Minutes per session: Not on file  . Stress: Not on file  Relationships  . Social connections:    Talks on phone: Patient refused    Gets together: Patient refused    Attends religious service: Patient refused    Active member of club or organization: Patient refused    Attends meetings of clubs or organizations: Patient refused    Relationship status: Patient refused  Other Topics Concern  . Not on file  Social History Narrative   Married. 2 children.    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    Walks with cane or wheelchair.    Former smoker.    Drinks caffeine.  Smoke alarm in the home.   Firearms in the home.    Wears her seatbelt.    Feels safe In her relationships.    Past Surgical History:  Procedure Laterality Date  . CORONARY ARTERY BYPASS GRAFT N/A 11/23/2017   Procedure: CORONARY ARTERY BYPASS GRAFTING (CABG) x 2, ON PUMP, USING LEFT INTERNAL MAMMARY ARTERY AND RIGHT GREATER SAPHENOUS VEIN HARVESTED ENDOSCOPICALLY;  Surgeon: Gaye Pollack, MD;  Location: [place];  Service: Open Heart Surgery;  Laterality: N/A;  . CRYOABLATION     cervical  . IR ANGIO INTRA EXTRACRAN SEL COM CAROTID INNOMINATE UNI R MOD SED  08/21/2017  . IR ANGIO VERTEBRAL SEL SUBCLAVIAN INNOMINATE UNI R MOD SED  08/21/2017  . IR PERCUTANEOUS ART THROMBECTOMY/INFUSION INTRACRANIAL INC DIAG ANGIO  08/21/2017  . IR RADIOLOGIST EVAL & MGMT  10/30/2017  . LOOP RECORDER INSERTION N/A 08/28/2017    Procedure: LOOP RECORDER INSERTION;  Surgeon: Constance Haw, MD;  Location: [place];  Service: Cardiovascular;  Laterality: N/A;  . RADIOLOGY WITH ANESTHESIA N/A 08/21/2017   Procedure: RADIOLOGY WITH ANESTHESIA;  Surgeon: Luanne Bras, MD;  Location: [facility];  Service: Radiology;  Laterality: N/A;  . RIGHT/LEFT HEART CATH AND CORONARY ANGIOGRAPHY N/A 11/20/2017   Procedure: RIGHT/LEFT HEART CATH AND CORONARY ANGIOGRAPHY;  Surgeon: Jettie Booze, MD;  Location: [place];  Service: Cardiovascular;  Laterality: N/A;  . TEE WITHOUT CARDIOVERSION N/A 08/28/2017   Procedure: TRANSESOPHAGEAL ECHOCARDIOGRAM (TEE);  Surgeon: Fay Records, MD;  Location: [place];  Service: Cardiovascular;  Laterality: N/A;  . TEE WITHOUT CARDIOVERSION N/A 11/23/2017   Procedure: TRANSESOPHAGEAL ECHOCARDIOGRAM (TEE);  Surgeon: Gaye Pollack, MD;  Location: [place];  Service: Open Heart Surgery;  Laterality: N/A;  . TUBAL LIGATION     Past Medical History:  Diagnosis Date  . Arthritis   . CAD (coronary artery disease)    a. s/p NSTEMI in 10/2017 with cath showing LM disease --> s/p CABG on 11/23/2017 with LIMA-LAD and SVG-OM.   [name] Hay fever   . Ischemic cardiomyopathy    a. EF 35-40% by echo in 10/2017  . NSTEMI (non-ST elevated myocardial infarction) ([place])   . PAF (paroxysmal atrial fibrillation) ([place])    Diagnosed by ILR  . Pneumonia 11/16/2017  . Stroke ([place]) 08/23/2017   Left MCA infarct status post TPA and thrombectomy   BP 103/61   Pulse 84   Ht 5' (1.524 m)   Wt 116 lb 6.4 oz (52.8 kg)   LMP  (LMP Unknown)   SpO2 94%   BMI 22.73 kg/m   Opioid Risk Score:   Fall Risk Score:  `1  Depression screen PHQ 2/9  Depression screen [facility] 2/9 11/10/2017 11/10/2017 10/13/2017 10/05/2017 10/05/2017  Decreased Interest 0 0 0 1 0  Down, Depressed, Hopeless 0 0 0 0 0  PHQ - 2 Score 0 0 0 1 0  Altered sleeping 0 - - 0 -  Tired, decreased energy 0 - - 2 -  Change in appetite 0  - - 0 -  Feeling bad or failure about yourself  0 - - 0 -  Trouble concentrating 0 - - 1 -  Moving slowly or fidgety/restless 0 - - 1 -  Suicidal thoughts 0 - - 0 -  PHQ-9 Score 0 - - 5 -  Difficult doing work/chores Not difficult at all - - Not difficult at all -    Review of Systems  HENT: Negative.  Eyes: Negative.   Respiratory: Negative.   Gastrointestinal: Negative.   Endocrine: Negative.   Genitourinary: Negative.   Musculoskeletal: Positive for arthralgias, gait problem, joint swelling and myalgias.  Skin: Negative.   Allergic/Immunologic: Negative.   Neurological: Positive for numbness.  Hematological: Negative.   Psychiatric/Behavioral: Negative.   All other systems reviewed and are negative.     Objective:   Physical Exam Constitutional: She appears well-developed and well-nourished.  HENT: Atraumatic. Normocephalic. Eyes: EOMI. No discharge.  Cardiovascular: RRR. No JVD   Respiratory: Normal effort. Clear. GI: Bowel sounds are normal. Non-distended Musculoskeletal: No edema or tenderness in extremities. Gait: Hemiplegic gait Neurological: She is alert and oriented.  Motor: Right upper extremity: 3/5 shoulder abduction, 4-/5 elbow flex/ext, 3/5 hand grip  Right lower extremity: Hip flexion 4-4+/5, knee extension 4-4+/5, ADF/PF 4-4+/5  MAS: elbow flexors 0/4, wrist ext 1/4, finger flexors 1/4, ankle plantor flexors 1/4 Skin: Warm and dry.  Intact.  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  1. 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 Status post loop recorder  Cont HEP  Patient states more discomfort/irration PRAFO/WHO/AFO  Cont Fluoxetine   She  followed up with Vascular, no further intervention at present  2. Spastic hemiplegia  Cont Baclofen to 10 TID (typically takes BID)  Will hold off on Botox at this time  3. Neurologic gait abnormality  With foot drag, improved with cognizant ambulation  Cont therapies  Cont quad cane  Encouraged use of AFO  4. Bilateral knee OA  B/l Zilretta injections performed on 03/23/18 - with good benefit  Encouraged trial Lidoderm patch  > 25 minutes spent with patient and husband, with >20 minutes in counseling regarding stroke, prognosis, and knee pain

## 2018-07-06 ENCOUNTER — Telehealth: Payer: Self-pay

## 2018-07-06 NOTE — Telephone Encounter (Signed)
The pt left her completed Wynetta Emery and Wynetta Emery pt asst application (including her 2018 proof of income but not OOPE report). I have completed the provider part of the application and placed it in Dr [employer] mail bin awaiting his signature.

## 2018-07-18 ENCOUNTER — Ambulatory Visit (INDEPENDENT_AMBULATORY_CARE_PROVIDER_SITE_OTHER): Payer: BLUE CROSS/BLUE SHIELD | Admitting: *Deleted

## 2018-07-18 DIAGNOSIS — I63512 Cerebral infarction due to unspecified occlusion or stenosis of left middle cerebral artery: Secondary | ICD-10-CM

## 2018-07-19 NOTE — Progress Notes (Signed)
[facility] Summary Report / Loop Recorder 

## 2018-08-02 NOTE — Telephone Encounter (Signed)
Received fax from [place] and [place] regarding patient application for assistance with XARELTO. Patient was denied because she didn't meet eligibility requirements. Patient has been notified by Wynetta Emery and [employer].

## 2018-08-09 LAB — CUP PACEART REMOTE DEVICE CHECK
Date Time Interrogation Session: 20191023213622
Implantable Pulse Generator Implant Date: 20181203

## 2018-08-20 ENCOUNTER — Ambulatory Visit (INDEPENDENT_AMBULATORY_CARE_PROVIDER_SITE_OTHER): Payer: BLUE CROSS/BLUE SHIELD

## 2018-08-20 DIAGNOSIS — I63512 Cerebral infarction due to unspecified occlusion or stenosis of left middle cerebral artery: Secondary | ICD-10-CM | POA: Diagnosis not present

## 2018-08-21 ENCOUNTER — Other Ambulatory Visit: Payer: Self-pay | Admitting: Physician Assistant

## 2018-08-21 NOTE — Progress Notes (Signed)
[facility] Summary Report / Loop Recorder 

## 2018-08-24 ENCOUNTER — Other Ambulatory Visit: Payer: Self-pay | Admitting: Physician Assistant

## 2018-08-27 ENCOUNTER — Other Ambulatory Visit: Payer: Self-pay | Admitting: Physician Assistant

## 2018-08-27 ENCOUNTER — Other Ambulatory Visit: Payer: Self-pay | Admitting: Family Medicine

## 2018-08-28 ENCOUNTER — Other Ambulatory Visit: Payer: Self-pay | Admitting: Physician Assistant

## 2018-08-28 ENCOUNTER — Other Ambulatory Visit: Payer: Self-pay

## 2018-08-28 ENCOUNTER — Other Ambulatory Visit: Payer: Self-pay | Admitting: Cardiology

## 2018-08-28 MED ORDER — CARVEDILOL 3.125 MG PO TABS: 3.1250 mg | ORAL_TABLET | Freq: Two times a day (BID) | ORAL | 3 refills | Status: DC

## 2018-08-28 NOTE — Telephone Encounter (Signed)
Needing refill on carvedilol (COREG) 6.25 MG tablet [[id number]]  Sent to [facility].  Please call pt for how much she's supposed to be taking. She'll need new dosing instructions on how much to be taking.

## 2018-08-28 NOTE — Telephone Encounter (Signed)
Refill for the correct dose of coerg 3.125 mg bid sent to pharmacy.

## 2018-09-06 ENCOUNTER — Encounter: Payer: BLUE CROSS/BLUE SHIELD | Admitting: Physical Medicine & Rehabilitation

## 2018-09-10 ENCOUNTER — Other Ambulatory Visit: Payer: Self-pay | Admitting: Family Medicine

## 2018-09-12 ENCOUNTER — Other Ambulatory Visit: Payer: Self-pay | Admitting: Family Medicine

## 2018-09-20 ENCOUNTER — Encounter: Payer: Self-pay | Admitting: Physical Medicine & Rehabilitation

## 2018-09-20 ENCOUNTER — Encounter
Payer: BLUE CROSS/BLUE SHIELD | Attending: Physical Medicine & Rehabilitation | Admitting: Physical Medicine & Rehabilitation

## 2018-09-20 VITALS — BP 110/75 | HR 85 | Resp 14 | Ht 61.0 in | Wt 122.0 lb

## 2018-09-20 DIAGNOSIS — G8191 Hemiplegia, unspecified affecting right dominant side: Secondary | ICD-10-CM

## 2018-09-20 DIAGNOSIS — I10 Essential (primary) hypertension: Secondary | ICD-10-CM

## 2018-09-20 DIAGNOSIS — K5900 Constipation, unspecified: Secondary | ICD-10-CM | POA: Insufficient documentation

## 2018-09-20 DIAGNOSIS — Z951 Presence of aortocoronary bypass graft: Secondary | ICD-10-CM | POA: Diagnosis not present

## 2018-09-20 DIAGNOSIS — I69351 Hemiplegia and hemiparesis following cerebral infarction affecting right dominant side: Secondary | ICD-10-CM | POA: Insufficient documentation

## 2018-09-20 DIAGNOSIS — G8110 Spastic hemiplegia affecting unspecified side: Secondary | ICD-10-CM

## 2018-09-20 DIAGNOSIS — Z87891 Personal history of nicotine dependence: Secondary | ICD-10-CM | POA: Insufficient documentation

## 2018-09-20 DIAGNOSIS — Z888 Allergy status to other drugs, medicaments and biological substances status: Secondary | ICD-10-CM | POA: Insufficient documentation

## 2018-09-20 DIAGNOSIS — Z833 Family history of diabetes mellitus: Secondary | ICD-10-CM | POA: Insufficient documentation

## 2018-09-20 DIAGNOSIS — M170 Bilateral primary osteoarthritis of knee: Secondary | ICD-10-CM | POA: Diagnosis not present

## 2018-09-20 DIAGNOSIS — I69391 Dysphagia following cerebral infarction: Secondary | ICD-10-CM | POA: Insufficient documentation

## 2018-09-20 DIAGNOSIS — Z8249 Family history of ischemic heart disease and other diseases of the circulatory system: Secondary | ICD-10-CM | POA: Diagnosis not present

## 2018-09-20 DIAGNOSIS — R1310 Dysphagia, unspecified: Secondary | ICD-10-CM | POA: Insufficient documentation

## 2018-09-20 DIAGNOSIS — R269 Unspecified abnormalities of gait and mobility: Secondary | ICD-10-CM

## 2018-09-20 DIAGNOSIS — I63512 Cerebral infarction due to unspecified occlusion or stenosis of left middle cerebral artery: Secondary | ICD-10-CM

## 2018-09-20 NOTE — Progress Notes (Signed)
Subjective:    Patient ID: [name], female    DOB: [birth year], 57 y.o.   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    Last clinic visit 07/04/18.  Since that visit, pt states she is doing HEP. Taking Baclofen BID. 1 fall visiting family.  She notes pain in b/l knees. She also complains of numbness in left fingers.    Pain Inventory Average Pain 7 Pain Right Now 7 My pain is aching  In the last 24 hours, has pain interfered with the following? General activity 7 Relation with others 7 Enjoyment of life 7 What TIME of day is your pain at its worst? morning Sleep (in general) Fair  Pain is worse with: walking Pain improves with: injections Relief from Meds: no pain meds  Mobility walk with assistance use a cane ability to climb steps?  yes do you drive?  yes Do you have any goals in this area?  no  Function retired I need assistance with the following:  meal prep, household duties and shopping  Neuro/Psych trouble walking  Prior Studies Any changes since last visit?  no  Physicians involved in your care Any changes since last visit?  no   Family History  Problem Relation Age of Onset  . Heart attack Father   . Hypertension Mother   . Hyperlipidemia Mother   . Diabetes type II Mother   . Hypertension Brother   . Breast cancer Cousin 74   Social History   Socioeconomic History  . Marital status: Married    Spouse name: Not on file  . Number of children: 2  . Years of education: Not on file  . Highest education level: Not on file  Occupational History  . Not on file  Social Needs  . Financial resource strain: Not on file  . Food insecurity:    Worry: Not on file    Inability: Not on file  . Transportation needs:    Medical: Not on file      Non-medical: Not on file  Tobacco Use  . Smoking status: Former Smoker    Packs/day: 0.12    Years: 37.00    Pack years: 4.44    Types: Cigarettes    Last attempt to quit: 08/21/2017    Years since quitting: 1.0  . Smokeless tobacco: Never Used  Substance and Sexual Activity  . Alcohol use: No    Frequency: Never  . Drug use: No  . Sexual activity: Not on file  Lifestyle  . Physical activity:    Days per week: Not on file    Minutes per session: Not on file  . Stress: Not on file  Relationships  . Social connections:    Talks on phone: Patient refused    Gets together: Patient refused    Attends religious service: Patient refused    Active member of club or organization: Patient refused    Attends meetings of clubs or organizations: Patient refused    Relationship status: Patient refused  Other Topics Concern  . Not on file  Social History Narrative   Married. 2 children.    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    Walks with cane or wheelchair.    Former smoker.    Drinks caffeine.    Smoke alarm in the home.  Firearms in the home.    Wears her seatbelt.    Feels safe In her relationships.    Past Surgical History:  Procedure Laterality Date  . CORONARY ARTERY BYPASS GRAFT N/A 11/23/2017   Procedure: CORONARY ARTERY BYPASS GRAFTING (CABG) x 2, ON PUMP, USING LEFT INTERNAL MAMMARY ARTERY AND RIGHT GREATER SAPHENOUS VEIN HARVESTED ENDOSCOPICALLY;  Surgeon: Gaye Pollack, MD;  Location: [facility] [place];  Service: Open Heart Surgery;  Laterality: N/A;  . CRYOABLATION     cervical  . IR ANGIO INTRA EXTRACRAN SEL COM CAROTID INNOMINATE UNI R MOD SED  08/21/2017  . IR ANGIO VERTEBRAL SEL SUBCLAVIAN INNOMINATE UNI R MOD SED  08/21/2017  . IR PERCUTANEOUS ART THROMBECTOMY/INFUSION INTRACRANIAL INC DIAG ANGIO  08/21/2017  . IR RADIOLOGIST EVAL & MGMT  10/30/2017  . LOOP RECORDER INSERTION N/A 08/28/2017   Procedure: LOOP RECORDER INSERTION;  Surgeon: Constance Haw, MD;   Location: [place] [facility];  Service: Cardiovascular;  Laterality: N/A;  . RADIOLOGY WITH ANESTHESIA N/A 08/21/2017   Procedure: RADIOLOGY WITH ANESTHESIA;  Surgeon: Luanne Bras, MD;  Location: [place];  Service: Radiology;  Laterality: N/A;  . RIGHT/LEFT HEART CATH AND CORONARY ANGIOGRAPHY N/A 11/20/2017   Procedure: RIGHT/LEFT HEART CATH AND CORONARY ANGIOGRAPHY;  Surgeon: Jettie Booze, MD;  Location: [place] [facility];  Service: Cardiovascular;  Laterality: N/A;  . TEE WITHOUT CARDIOVERSION N/A 08/28/2017   Procedure: TRANSESOPHAGEAL ECHOCARDIOGRAM (TEE);  Surgeon: Fay Records, MD;  Location: [place];  Service: Cardiovascular;  Laterality: N/A;  . TEE WITHOUT CARDIOVERSION N/A 11/23/2017   Procedure: TRANSESOPHAGEAL ECHOCARDIOGRAM (TEE);  Surgeon: Gaye Pollack, MD;  Location: [place];  Service: Open Heart Surgery;  Laterality: N/A;  . TUBAL LIGATION     Past Medical History:  Diagnosis Date  . Arthritis   . CAD (coronary artery disease)    a. s/p NSTEMI in 10/2017 with cath showing LM disease --> s/p CABG on 11/23/2017 with LIMA-LAD and SVG-OM.   [name] Hay fever   . Ischemic cardiomyopathy    a. EF 35-40% by echo in 10/2017  . NSTEMI (non-ST elevated myocardial infarction) ([place])   . PAF (paroxysmal atrial fibrillation) ([place])    Diagnosed by ILR  . Pneumonia 11/16/2017  . Stroke ([place]) 08/23/2017   Left MCA infarct status post TPA and thrombectomy   BP 110/75   Pulse 85   Resp 14   Ht 5\' 1"  (1.549 m)   Wt 122 lb (55.3 kg)   LMP  (LMP Unknown)   SpO2 91%   BMI 23.05 kg/m   Opioid Risk Score:   Fall Risk Score:  `1  Depression screen PHQ 2/9  Depression screen [facility] 2/9 11/10/2017 11/10/2017 10/13/2017 10/05/2017 10/05/2017  Decreased Interest 0 0 0 1 0  Down, Depressed, Hopeless 0 0 0 0 0  PHQ - 2 Score 0 0 0 1 0  Altered sleeping 0 - - 0 -  Tired, decreased energy 0 - - 2 -  Change in appetite 0 - - 0 -  Feeling bad or failure about yourself  0 - - 0 -   Trouble concentrating 0 - - 1 -  Moving slowly or fidgety/restless 0 - - 1 -  Suicidal thoughts 0 - - 0 -  PHQ-9 Score 0 - - 5 -  Difficult doing work/chores Not difficult at all - - Not difficult at all -    Review of Systems  HENT: Negative.   Eyes: Negative.  Respiratory: Negative.   Gastrointestinal: Negative.   Endocrine: Negative.   Genitourinary: Negative.   Musculoskeletal: Positive for arthralgias, gait problem and myalgias.  Skin: Negative.   Allergic/Immunologic: Negative.   Hematological: Negative.   Psychiatric/Behavioral: Negative.   All other systems reviewed and are negative.     Objective:   Physical Exam Constitutional: She appears well-developed and well-nourished.  HENT: Atraumatic. Normocephalic. Eyes: EOMI. No discharge.  Cardiovascular: RRR. No JVD   Respiratory: Normal effort. Clear. GI: Bowel sounds are normal. Non-distended Musculoskeletal: No edema or tenderness in extremities. Gait: Hemiplegic gait Neurological: She is alert and oriented.  Motor: Right upper extremity: 3+/5 shoulder abduction, 4-/5 elbow flex/ext, 3+/5 hand grip  Right lower extremity: Hip flexion 4-4+/5, knee extension 4-4+/5, ADF/PF 4-4+/5  MAS: elbow flexors 0/4, wrist ext 1/4, finger flexors 1/4, ankle plantor flexors 1/4 Skin: Warm and dry.  Intact.  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  1. 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 Status post loop recorder  Cont HEP  Patient states more discomfort/irration PRAFO/WHO/AFO  Cont Fluoxetine   She followed up with Vascular, no further intervention at present  2. Spastic hemiplegia  Cont Baclofen to 10 TID  (typically takes BID)  Will hold off on Botox at this time  3. Neurologic gait abnormality  With foot drag, improved with cognizant ambulation  Cont HEP  Cont quad cane  Encouraged use of AFO  4. Bilateral knee OA  B/l Zilretta injections performed on 03/23/18 - with good benefit  Encouraged trial Lidoderm patch  Will order xrays  Will order Synvisc b/l  5. Left hand numbness  Pt to consider NCS/EMG in future

## 2018-09-24 ENCOUNTER — Ambulatory Visit (INDEPENDENT_AMBULATORY_CARE_PROVIDER_SITE_OTHER): Payer: BLUE CROSS/BLUE SHIELD

## 2018-09-24 DIAGNOSIS — I63512 Cerebral infarction due to unspecified occlusion or stenosis of left middle cerebral artery: Secondary | ICD-10-CM | POA: Diagnosis not present

## 2018-09-24 NOTE — Progress Notes (Signed)
[facility] Summary Report / Loop Recorder 

## 2018-09-25 LAB — CUP PACEART REMOTE DEVICE CHECK
Date Time Interrogation Session: 20191228231120
Implantable Pulse Generator Implant Date: 20181203

## 2018-09-28 ENCOUNTER — Other Ambulatory Visit: Payer: Self-pay | Admitting: Cardiology

## 2018-09-28 ENCOUNTER — Ambulatory Visit (HOSPITAL_COMMUNITY)
Admission: RE | Admit: 2018-09-28 | Discharge: 2018-09-28 | Disposition: A | Discharge status: Home / Self Care | Payer: BLUE CROSS/BLUE SHIELD | Source: Ambulatory Visit | Attending: Physical Medicine & Rehabilitation | Admitting: Physical Medicine & Rehabilitation

## 2018-09-28 DIAGNOSIS — M1712 Unilateral primary osteoarthritis, left knee: Secondary | ICD-10-CM | POA: Diagnosis not present

## 2018-09-28 DIAGNOSIS — M1711 Unilateral primary osteoarthritis, right knee: Secondary | ICD-10-CM | POA: Diagnosis not present

## 2018-09-28 DIAGNOSIS — M170 Bilateral primary osteoarthritis of knee: Secondary | ICD-10-CM | POA: Insufficient documentation

## 2018-10-03 ENCOUNTER — Ambulatory Visit: Payer: BLUE CROSS/BLUE SHIELD | Admitting: Student

## 2018-10-03 ENCOUNTER — Encounter: Payer: Self-pay | Admitting: Student

## 2018-10-03 VITALS — BP 100/64 | HR 77 | Ht 61.0 in | Wt 123.0 lb

## 2018-10-03 DIAGNOSIS — I6523 Occlusion and stenosis of bilateral carotid arteries: Secondary | ICD-10-CM

## 2018-10-03 DIAGNOSIS — I25810 Atherosclerosis of coronary artery bypass graft(s) without angina pectoris: Secondary | ICD-10-CM

## 2018-10-03 DIAGNOSIS — I255 Ischemic cardiomyopathy: Secondary | ICD-10-CM | POA: Diagnosis not present

## 2018-10-03 DIAGNOSIS — I480 Paroxysmal atrial fibrillation: Secondary | ICD-10-CM | POA: Diagnosis not present

## 2018-10-03 DIAGNOSIS — I5022 Chronic systolic (congestive) heart failure: Secondary | ICD-10-CM

## 2018-10-03 DIAGNOSIS — E785 Hyperlipidemia, unspecified: Secondary | ICD-10-CM

## 2018-10-03 DIAGNOSIS — Z8673 Personal history of transient ischemic attack (TIA), and cerebral infarction without residual deficits: Secondary | ICD-10-CM

## 2018-10-03 NOTE — Patient Instructions (Signed)
Medication Instructions:  Your physician recommends that you continue on your current medications as directed. Please refer to the Current Medication list given to you today.  If you need a refill on your cardiac medications before your next appointment, please call your pharmacy.   Lab work: NONE   If you have labs (blood work) drawn today and your tests are completely normal, you will receive your results only by: . MyChart Message (if you have MyChart) OR . A paper copy in the mail If you have any lab test that is abnormal or we need to change your treatment, we will call you to review the results.  Testing/Procedures: NONE   Follow-Up: At [facility], you and your health needs are our priority.  As part of our continuing mission to provide you with exceptional heart care, we have created designated Provider Care Teams.  These Care Teams include your primary Cardiologist (physician) and Advanced Practice Providers (APPs -  Physician Assistants and Nurse Practitioners) who all work together to provide you with the care you need, when you need it. You will need a follow up appointment in 4-5 months.  Please call our office 2 months in advance to schedule this appointment.  You may see Samuel McDowell, MD or one of the following Advanced Practice Providers on your designated Care Team:   Brittany Strader, PA-C ([place] Office) . Michele Lenze, PA-C ([place] Office)  Any Other Special Instructions Will Be Listed Below (If Applicable). Thank you for choosing  [facility]!     

## 2018-10-03 NOTE — Progress Notes (Addendum)
Cardiology Office Note    Date:  10/03/2018   ID:  [name], DOB 27-Nov-1960, MRN [id number]  PCP:  [name], DO  Cardiologist: [name], MD   EP: Dr. Curt Bears  Chief Complaint  Patient presents with  . Follow-up    3 month visit    History of Present Illness:    [name] is a 58 y.o. female with past medical history of CAD (s/p NSTEMI in 10/2017 with 3-vessel CAD by cath and CABG on 11/23/2017 with LIMA-LAD and SVG-OM), PAF (on Xarelto), ischemic cardiomyopathy (EF 35-40% by echo in 10/2017), carotid artery stenosis, HLD, and prior CVA who presents to the office today for 63-month follow-up.  She was last examined by Dr. Domenic Polite in 04/2018 and denied any recent chest pain, dyspnea, or palpitations at that time. She did report symptoms concerning for claudication and given known PAD by prior doppler studies, she was referred to [facility]. They felt that her symptoms were likely due to weakness from her prior CVA and no further evaluation was pursued. It was recommended that she have repeat carotid dopplers in 6 months. She did undergo a follow-up echocardiogram in 04/2018 which showed her EF remained reduced at 35 to 40% with diffuse hypokinesis. Was started on Entresto 24-26 mg twice daily with repeat labs in 05/2018 showing stable electrolytes and kidney function.   In talking with the patient and her husband today, she reports overall doing well from a cardiac perspective since her last office visit. She denies any recent chest pain, dyspnea on exertion, orthopnea, PND, or lower extremity edema. She reports good compliance with Xarelto and denies missing any recent doses. She does experience easy bruising with this but denies any melena, hematochezia, or hematuria.  She has not checked her blood pressure regularly at home but it is well controlled at 100/64 during today's visit. Denies any associated lightheadedness, dizziness, or presyncope.   Past Medical  History:  Diagnosis Date  . Arthritis   . CAD (coronary artery disease)    a. s/p NSTEMI in 10/2017 with cath showing LM disease --> s/p CABG on 11/23/2017 with LIMA-LAD and SVG-OM.   [name] Hay fever   . Ischemic cardiomyopathy    a. EF 35-40% by echo in 10/2017  . NSTEMI (non-ST elevated myocardial infarction) ([place])   . PAF (paroxysmal atrial fibrillation) ([place])    Diagnosed by ILR  . Pneumonia 11/16/2017  . Stroke [facility]) 08/23/2017   Left MCA infarct status post TPA and thrombectomy    Past Surgical History:  Procedure Laterality Date  . CORONARY ARTERY BYPASS GRAFT N/A 11/23/2017   Procedure: CORONARY ARTERY BYPASS GRAFTING (CABG) x 2, ON PUMP, USING LEFT INTERNAL MAMMARY ARTERY AND RIGHT GREATER SAPHENOUS VEIN HARVESTED ENDOSCOPICALLY;  Surgeon: Gaye Pollack, MD;  Location: [facility];  Service: Open Heart Surgery;  Laterality: N/A;  . CRYOABLATION     cervical  . IR ANGIO INTRA EXTRACRAN SEL COM CAROTID INNOMINATE UNI R MOD SED  08/21/2017  . IR ANGIO VERTEBRAL SEL SUBCLAVIAN INNOMINATE UNI R MOD SED  08/21/2017  . IR PERCUTANEOUS ART THROMBECTOMY/INFUSION INTRACRANIAL INC DIAG ANGIO  08/21/2017  . IR RADIOLOGIST EVAL & MGMT  10/30/2017  . LOOP RECORDER INSERTION N/A 08/28/2017   Procedure: LOOP RECORDER INSERTION;  Surgeon: Constance Haw, MD;  Location: [place] [facility];  Service: Cardiovascular;  Laterality: N/A;  . RADIOLOGY WITH ANESTHESIA N/A 08/21/2017   Procedure: RADIOLOGY WITH ANESTHESIA;  Surgeon: Luanne Bras, MD;  Location: [facility];  Service: Radiology;  Laterality: N/A;  . RIGHT/LEFT HEART CATH AND CORONARY ANGIOGRAPHY N/A 11/20/2017   Procedure: RIGHT/LEFT HEART CATH AND CORONARY ANGIOGRAPHY;  Surgeon: Jettie Booze, MD;  Location: [place] [facility];  Service: Cardiovascular;  Laterality: N/A;  . TEE WITHOUT CARDIOVERSION N/A 08/28/2017   Procedure: TRANSESOPHAGEAL ECHOCARDIOGRAM (TEE);  Surgeon: Fay Records, MD;  Location: [facility];  Service:  Cardiovascular;  Laterality: N/A;  . TEE WITHOUT CARDIOVERSION N/A 11/23/2017   Procedure: TRANSESOPHAGEAL ECHOCARDIOGRAM (TEE);  Surgeon: Gaye Pollack, MD;  Location: [facility];  Service: Open Heart Surgery;  Laterality: N/A;  . TUBAL LIGATION      Current Medications: Outpatient Medications Prior to Visit  Medication Sig Dispense Refill  . baclofen (LIORESAL) 10 MG tablet Take 1 tablet (10 mg total) by mouth 3 (three) times daily. 90 each 1  . calcium carbonate (TUMS - DOSED IN MG ELEMENTAL CALCIUM) 500 MG chewable tablet Chew 1 tablet by mouth daily.    . carvedilol (COREG) 3.125 MG tablet Take 1 tablet (3.125 mg total) by mouth 2 (two) times daily. 180 tablet 3  . cholecalciferol (VITAMIN D) 1000 units tablet Take 1,000 Units by mouth daily.    [name] escitalopram (LEXAPRO) 10 MG tablet Take 1 tablet (10 mg total) by mouth daily. 90 tablet 1  . famotidine (PEPCID) 20 MG tablet Take 20 mg by mouth 2 (two) times daily.    . ferrous sulfate 325 (65 FE) MG tablet Take 325 mg by mouth daily with breakfast.    . furosemide (LASIX) 20 MG tablet Take 1 tablet (20 mg total) by mouth daily. 90 tablet 1  . levocetirizine (XYZAL) 5 MG tablet TAKE 1 TABLET BY MOUTH ONCE DAILY IN THE EVENING 90 tablet 0  . Potassium Chloride ER 20 MEQ TBCR Take 20 mEq by mouth daily. 90 tablet 1  . ranitidine (ZANTAC) 150 MG tablet TAKE 1 TABLET BY MOUTH TWICE DAILY 180 tablet 0  . rosuvastatin (CRESTOR) 20 MG tablet Take 1 tablet (20 mg total) by mouth daily. 90 tablet 3  . sacubitril-valsartan (ENTRESTO) 24-26 MG Take 1 tablet by mouth 2 (two) times daily. 60 tablet 11  . triamcinolone cream (KENALOG) 0.1 % Apply 1 application topically 2 (two) times daily. 30 g 2  . vitamin B-12 (CYANOCOBALAMIN) 100 MCG tablet Take 100 mcg by mouth daily.    Alveda Reasons 20 MG TABS tablet TAKE 1 TABLET BY MOUTH ONCE DAILY WITH SUPPER 90 tablet 1  . lisinopril (PRINIVIL,ZESTRIL) 2.5 MG tablet Take 2.5 mg by mouth daily.     No  facility-administered medications prior to visit.      Allergies:   Lopressor [metoprolol tartrate] and Lipitor [atorvastatin]   Social History   Socioeconomic History  . Marital status: Married    Spouse name: Not on file  . Number of children: 2  . Years of education: Not on file  . Highest education level: Not on file  Occupational History  . Not on file  Social Needs  . Financial resource strain: Not on file  . Food insecurity:    Worry: Not on file    Inability: Not on file  . Transportation needs:    Medical: Not on file    Non-medical: Not on file  Tobacco Use  . Smoking status: Former Smoker    Packs/day: 0.12    Years: 37.00    Pack years: 4.44    Types: Cigarettes    Last  attempt to quit: 08/21/2017    Years since quitting: 1.1  . Smokeless tobacco: Never Used  Substance and Sexual Activity  . Alcohol use: No    Frequency: Never  . Drug use: No  . Sexual activity: Not on file  Lifestyle  . Physical activity:    Days per week: Not on file    Minutes per session: Not on file  . Stress: Not on file  Relationships  . Social connections:    Talks on phone: Patient refused    Gets together: Patient refused    Attends religious service: Patient refused    Active member of club or organization: Patient refused    Attends meetings of clubs or organizations: Patient refused    Relationship status: Patient refused  Other Topics Concern  . Not on file  Social History Narrative   Married. 2 children.    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    Walks with cane or wheelchair.    Former smoker.    Drinks caffeine.    Smoke alarm in the home.   Firearms in the home.    Wears her seatbelt.    Feels safe In her relationships.      Family History:  The patient's family history includes Breast cancer (age of onset: 29) in her cousin; Diabetes type II in her mother; Heart attack in her father; Hyperlipidemia in her mother; Hypertension in her brother and mother.    Review of Systems:   Please see the history of present illness.     General:  No chills, fever, night sweats or weight changes.  Cardiovascular:  No chest pain, dyspnea on exertion, edema, orthopnea, palpitations, paroxysmal nocturnal dyspnea. Dermatological: No rash, lesions/masses. Positive for easy bruising.  Respiratory: No cough, dyspnea Urologic: No hematuria, dysuria Abdominal:   No nausea, vomiting, diarrhea, bright red blood per rectum, melena, or hematemesis Neurologic:  No visual changes, changes in mental status. Positive for right-sided weakness (chronic since prior CVA).   All other systems reviewed and are otherwise negative except as noted above.   Physical Exam:    VS:  BP 100/64   Pulse 77   Ht 5\' 1"  (1.549 m)   Wt 123 lb (55.8 kg)   LMP  (LMP Unknown)   SpO2 97%   BMI 23.24 kg/m    General: Well developed, well nourished Caucasian female appearing in no acute distress. Head: Normocephalic, atraumatic, sclera non-icteric, no xanthomas, nares are without discharge.  Neck: No carotid bruits. JVD not elevated.  Lungs: Respirations regular and unlabored, without wheezes or rales.  Heart: Regular rate and rhythm. No S3 or S4.  No murmur, no rubs, or gallops appreciated. Abdomen: Soft, non-tender, non-distended with normoactive bowel sounds. No hepatomegaly. No rebound/guarding. No obvious abdominal masses. Msk:  No joint deformities or effusions. Weakness noted along right upper and lower extremities.  Extremities: No clubbing or cyanosis. No lower extremity edema.  Distal pedal pulses are 2+ bilaterally. Neuro: Alert and oriented X 3. Moves all extremities spontaneously. No focal deficits noted. Psych:  Responds to questions appropriately with a normal affect. Skin: No rashes or lesions noted  Wt Readings from Last 3 Encounters:  10/03/18 123 lb (55.8 kg)  09/20/18 122 lb (55.3 kg)  07/04/18 116 lb 6.4 oz (52.8 kg)     Studies/Labs Reviewed:   EKG:  EKG  is not ordered today.   Recent Labs: 11/18/2017: B Natriuretic Peptide 1,276.7 11/24/2017: Magnesium 2.3 11/30/2017: ALT 10 02/02/2018: TSH 1.98 03/15/2018: Hemoglobin  14.5; Platelets 364 06/01/2018: BUN 19; Creatinine, Ser 0.62; Potassium 4.0; Sodium 138   Lipid Panel    Component Value Date/Time   CHOL 237 (H) 08/22/2017 0811   TRIG 295 (H) 08/22/2017 0811   TRIG 301 (H) 08/22/2017 0811   HDL 35 (L) 08/22/2017 0811   CHOLHDL 6.8 08/22/2017 0811   VLDL 59 (H) 08/22/2017 0811   LDLCALC 143 (H) 08/22/2017 8546    Additional studies/ records that were reviewed today include:   Cardiac Catheterization: 10/2017 Prox Cx to Mid Cx lesion is 100% stenosed.  Mid LM to Dist LM lesion is 75% stenosed.  Ost Cx to Prox Cx lesion is 50% stenosed.  Ost LAD to Prox LAD lesion is 40% stenosed.  Mid LAD lesion is 40% stenosed.  There is severe left ventricular systolic dysfunction.  The left ventricular ejection fraction is 25-35% by visual estimate.  There is no aortic valve stenosis.  LV end diastolic pressure is normal.  Prox RCA lesion is 90% stenosed.  Ao sat 93%; PA sat 62%; CO 3.5 L/min; CI 2.29; mean PCWP 2 mm Hg. Normal right heart pressures. Severe left main disease.  Severely decreased LV function. Normal LVEDP.  Resume heparin 6 hours post sheath pull.   Echocardiogram: 04/2018 Study Conclusions  - Left ventricle: The cavity size was normal. Wall thickness was   increased in a pattern of mild LVH. Systolic function was   moderately reduced. The estimated ejection fraction was in the   range of 35% to 40%. Diffuse hypokinesis. Doppler parameters are   consistent with abnormal left ventricular relaxation (grade 1   diastolic dysfunction). - Aortic valve: Mildly calcified annulus. Mildly thickened   leaflets. Valve area (VTI): 1.69 cm^2. Valve area (Vmax): 1.71   cm^2. Valve area (Vmean): 1.62 cm^2. - Mitral valve: Mildly calcified annulus. Mildly thickened leaflets   . There was  mild regurgitation. - Right ventricle: Systolic function was mildly reduced. - Atrial septum: No defect or patent foramen ovale was identified. - Technically adequate study.  Carotid Dopplers: 04/2018 IMPRESSION: Atherosclerotic disease in bilateral carotid arteries.  Estimated degree of stenosis in bilateral internal carotid arteries is 50-69%.  Left vertebral artery is not visualized and could be occluded. Normal antegrade flow in the right vertebral artery.  Assessment:    1. Coronary artery disease involving coronary bypass graft of native heart without angina pectoris   2. Chronic systolic (congestive) heart failure ([place])   3. Ischemic cardiomyopathy   4. Paroxysmal atrial fibrillation (H[facility])   5. Bilateral carotid artery stenosis   6. Hyperlipidemia LDL goal <70   7. History of CVA (cerebrovascular accident)      Plan:   In order of problems listed above:  1. CAD - s/p NSTEMI in 10/2017 with 3-vessel CAD by cath and CABG on 11/23/2017 with LIMA-LAD and SVG-OM. She denies any recent chest pain or dyspnea on exertion. - Continue beta-blocker and statin therapy. She is not on ASA given the need for anticoagulation.  2. Chronic Systolic CHF/ Ischemic Cardiomyopathy - EF was reduced to 35-40% by echo in 10/2017 with similar readings by repeat echocardiogram in 04/2018 with Lisinopril being transitioned to Entresto 24-26 mg twice daily at that time.  - She denies any recent dyspnea on exertion, orthopnea, PND, or lower extremity edema. Appears euvolemic by examination today.   - Will continue on Coreg 3.125 mg twice daily, Entresto 24-26 mg twice daily, and Lasix (she is currently taking 10 mg daily). Soft BP does not  allow for further titration of Coreg or Entresto at this time.   3. Paroxysmal Atrial Fibrillation - She denies any recent palpitations and recent device interrogations have shown no recent recurrence. She remains on Coreg 3.125 mg twice daily for rate  control. - She denies any evidence of active bleeding. Continue Xarelto 20 mg daily for anticoagulation.  4. Carotid Artery Stenosis - Most recent study in 04/2018 showed 50 to 69% stenosis bilaterally. Being followed by Vascular Surgery.  5. HLD - Followed by PCP. She remains on Crestor 20 mg daily. Goal LDL is less than 70 in the setting of known CAD and prior CVA.  6. History of CVA - She has residual right sided weakness which has been present since her prior CVA. She did have a Linq placed at the time of her CVA in 08/2017 which has since demonstrated atrial fibrillation as outlined above with initiation of anticoagulation. She questions if the ILR should remain in place or be removed. It is not causing her any symptoms at this time. I informed her I will reach out to Dr. Curt Bears to gather his recommendations on this.  ADDENDUM: Reviewed with Dr. Curt Bears. Will plan to keep ILR in place for now if not bothering the patient to further assess for AF burden. Called and reviewed recommendations with the patient who was in agreement.    Medication Adjustments/Labs and Tests Ordered: Current medicines are reviewed at length with the patient today.  Concerns regarding medicines are outlined above.  Medication changes, Labs and Tests ordered today are listed in the Patient Instructions below. Patient Instructions  Medication Instructions:  Your physician recommends that you continue on your current medications as directed. Please refer to the Current Medication list given to you today.  If you need a refill on your cardiac medications before your next appointment, please call your pharmacy.   Lab work: NONE   If you have labs (blood work) drawn today and your tests are completely normal, you will receive your results only by: [name] MyChart Message (if you have MyChart) OR . A paper copy in the mail If you have any lab test that is abnormal or we need to change your treatment, we will call you to  review the results.  Testing/Procedures: NONE   Follow-Up: At [facility], you and your health needs are our priority.  As part of our continuing mission to provide you with exceptional heart care, we have created designated Provider Care Teams.  These Care Teams include your primary Cardiologist (physician) and Advanced Practice Providers (APPs -  Physician Assistants and Nurse Practitioners) who all work together to provide you with the care you need, when you need it. You will need a follow up appointment in 4-5  months.  Please call our office 2 months in advance to schedule this appointment.  You may see [name], MD or one of the following Advanced Practice Providers on your designated Care Team:   Bernerd Pho, PA-C [facility]) . Ermalinda Barrios, PA-C ([facility])  Any Other Special Instructions Will Be Listed Below (If Applicable). Thank you for choosing [facility]!     Signed, Erma Heritage, PA-C  10/03/2018 2:54 PM    [place] [address] [place], [place] [address] Phone: [phone]

## 2018-10-04 IMAGING — CT CT ANGIO CHEST
2 of 6 series · 19 of 46 positions shown · IV contrast (Isovue)
Comparison: Chest x-ray 11/16/2017

CLINICAL DATA: Suspect pulmonary embolism. Positive D-dimer. Short
of breath.

EXAM:
CT ANGIOGRAPHY CHEST WITH CONTRAST
TECHNIQUE: Multidetector CT imaging of the chest was performed using the
standard protocol during bolus administration of intravenous
contrast. Multiplanar CT image reconstructions and MIPs were
obtained to evaluate the vascular anatomy.
CONTRAST:  100mL FI810C-ATB IOPAMIDOL (FI810C-ATB) INJECTION 76%

[Series 5: thins · axial · 0.72mm/px · z∈[+1103,+1344]mm · 16 of 265 slices shown]
[im 12/265  lung]
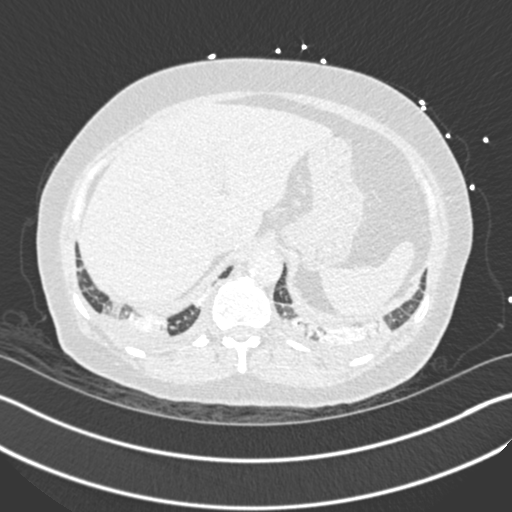
[im 35/265  soft-tissue]
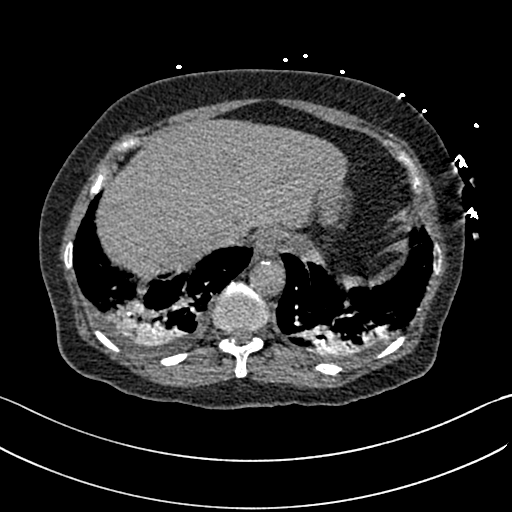
[im 46/265  lung]
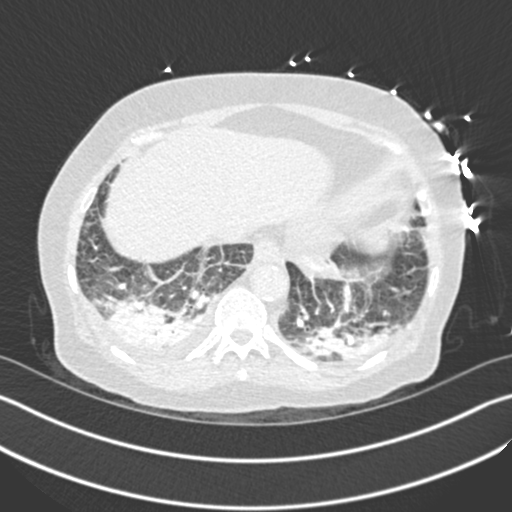
[im 58/265  soft-tissue]
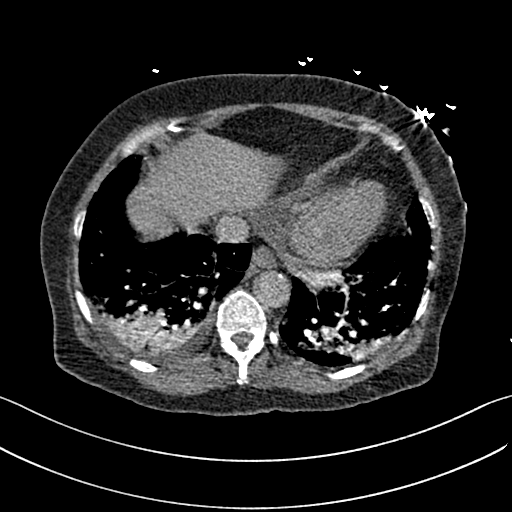
[im 81/265  lung]
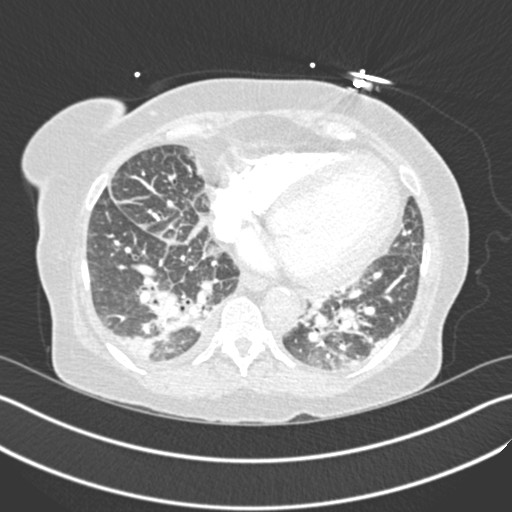
[im 92/265  soft-tissue]
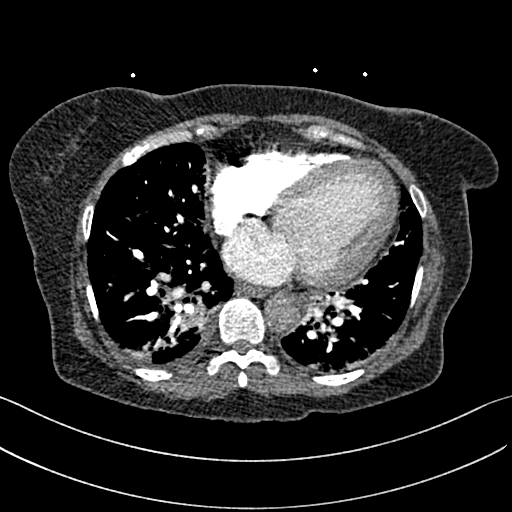
[im 104/265  lung]
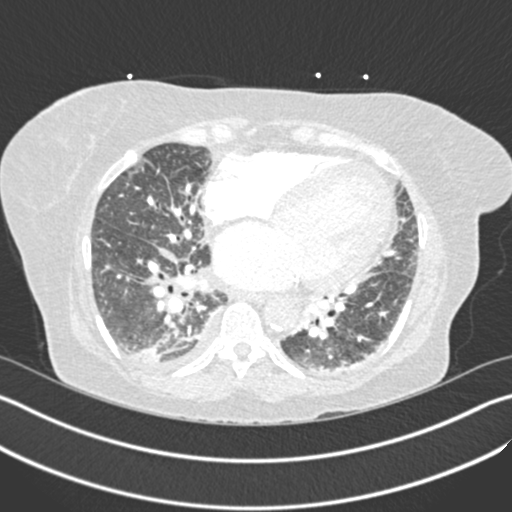
[im 127/265  soft-tissue]
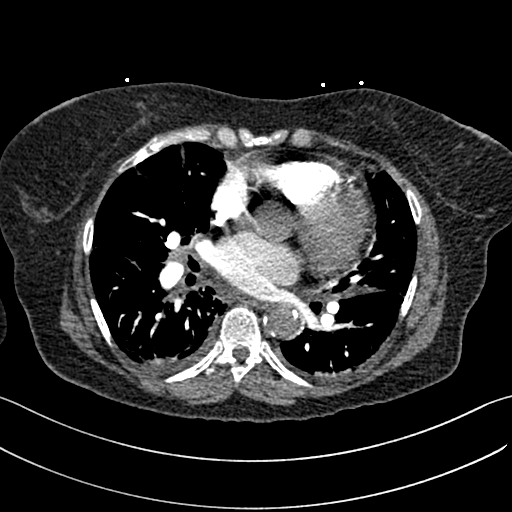
[im 138/265  lung]
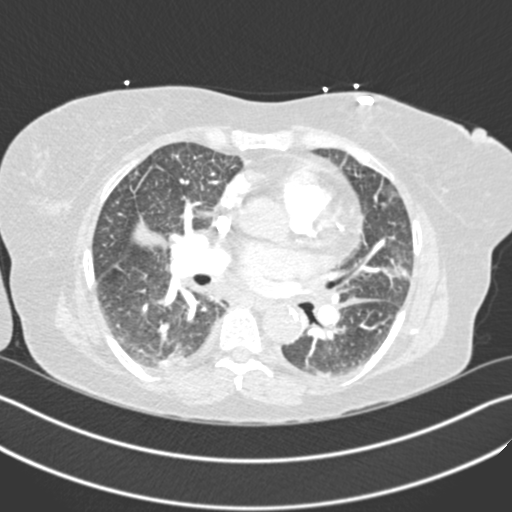
[im 161/265  soft-tissue]
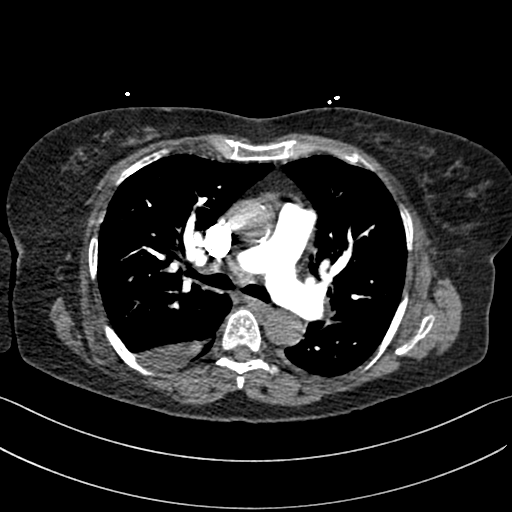
[im 173/265  lung]
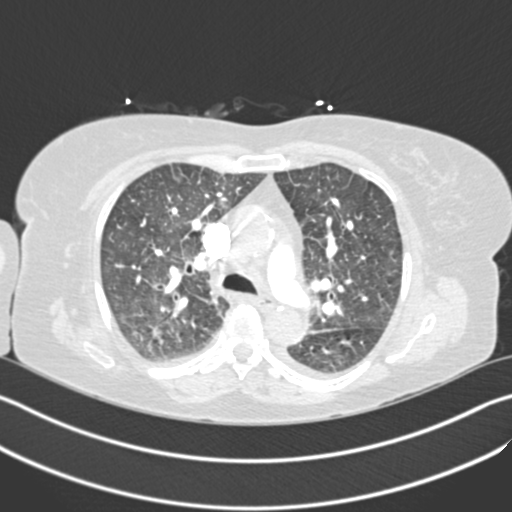
[im 184/265  soft-tissue]
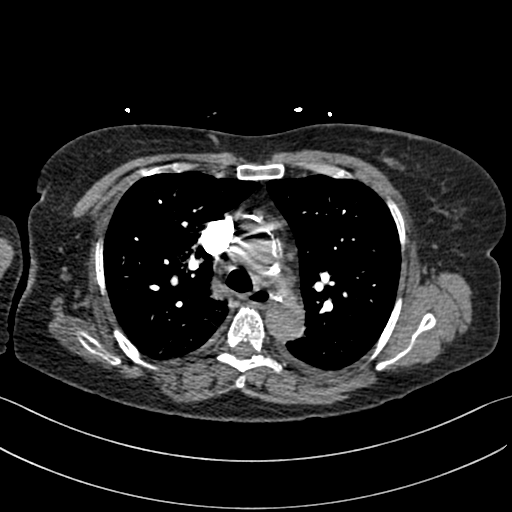
[im 207/265  lung]
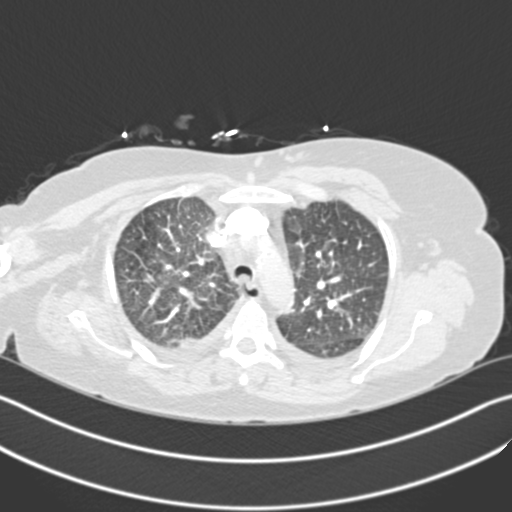
[im 219/265  soft-tissue]
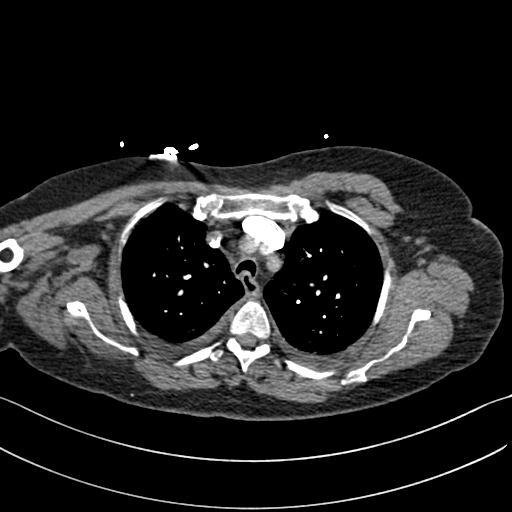
[im 230/265  lung]
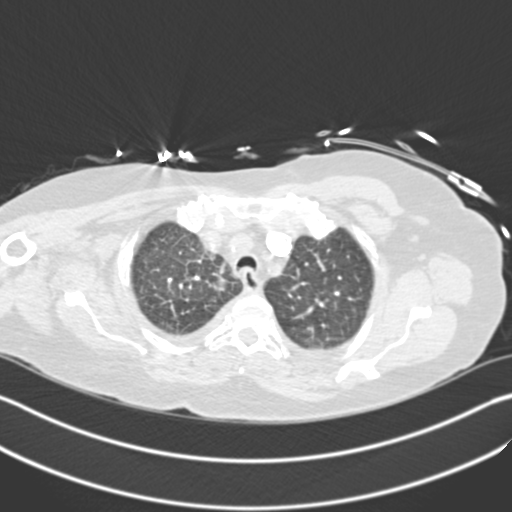
[im 253/265  soft-tissue]
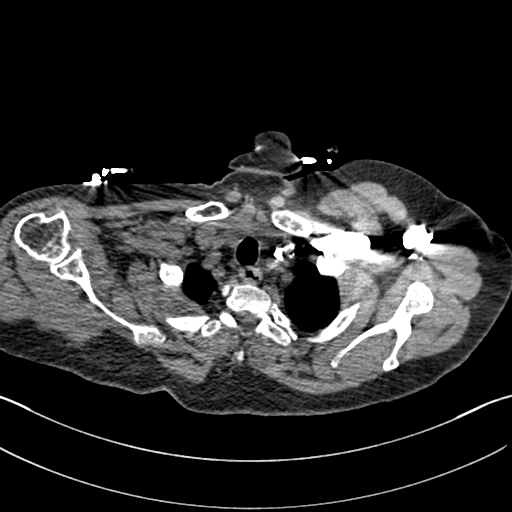

[Series 7: coronal mpr · coronal · 0.54mm/px · 3 of 131 slices shown]
[im 33/131  soft-tissue]
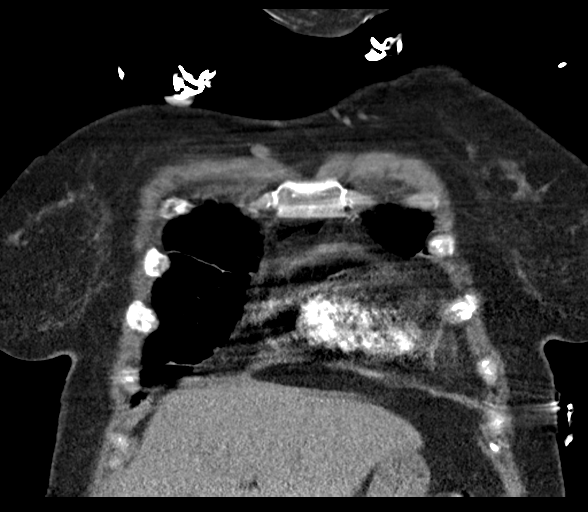
[im 66/131  soft-tissue]
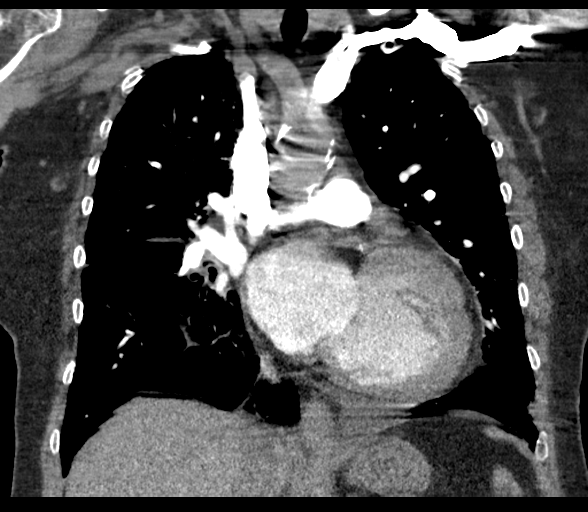
[im 98/131  soft-tissue]
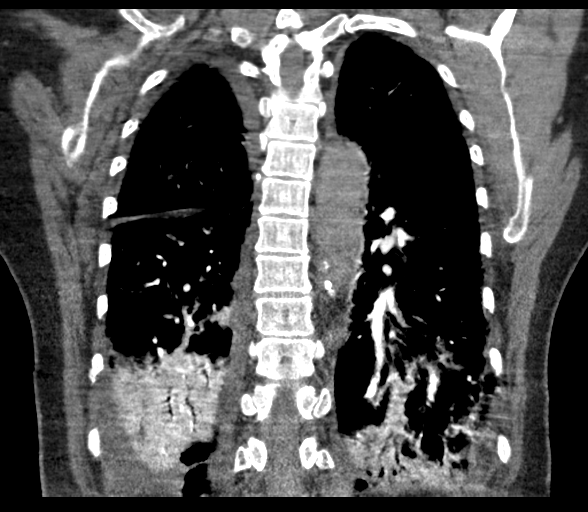

[19 of 46 positions shown; findings below may reference images not displayed]

FINDINGS: Cardiovascular: Negative for pulmonary embolism. Pulmonary arteries
normal in caliber

Atherosclerotic thoracic aorta without aneurysm. Aorta not
significantly opacified. Atherosclerotic calcification in the
coronary arteries. Left ventricular dilatation. No pericardial
effusion.

Mediastinum/Nodes: Negative for mass or adenopathy

Lungs/Pleura: Dependent infiltrates in the lower lobes bilaterally
suggestive of pneumonia. Small pleural effusions bilaterally.

Upper Abdomen: Negative

Musculoskeletal: Negative

Review of the MIP images confirms the above findings.
IMPRESSION: Negative for pulmonary embolism

Dependent infiltrates in the lower lobes bilaterally, probable
aspiration pneumonia. Small pleural effusions bilaterally.

Left ventricular dilatation.  Coronary artery calcification.

Aortic Atherosclerosis ([id number].M).

## 2018-10-05 ENCOUNTER — Encounter: Payer: Self-pay | Admitting: Physical Medicine & Rehabilitation

## 2018-10-05 ENCOUNTER — Encounter
Payer: BLUE CROSS/BLUE SHIELD | Attending: Physical Medicine & Rehabilitation | Admitting: Physical Medicine & Rehabilitation

## 2018-10-05 VITALS — BP 87/63 | HR 66 | Ht 61.0 in | Wt 123.0 lb

## 2018-10-05 DIAGNOSIS — M170 Bilateral primary osteoarthritis of knee: Secondary | ICD-10-CM

## 2018-10-05 DIAGNOSIS — K5900 Constipation, unspecified: Secondary | ICD-10-CM | POA: Insufficient documentation

## 2018-10-05 DIAGNOSIS — Z888 Allergy status to other drugs, medicaments and biological substances status: Secondary | ICD-10-CM | POA: Diagnosis not present

## 2018-10-05 DIAGNOSIS — R1310 Dysphagia, unspecified: Secondary | ICD-10-CM | POA: Insufficient documentation

## 2018-10-05 DIAGNOSIS — I69351 Hemiplegia and hemiparesis following cerebral infarction affecting right dominant side: Secondary | ICD-10-CM | POA: Diagnosis not present

## 2018-10-05 DIAGNOSIS — Z87891 Personal history of nicotine dependence: Secondary | ICD-10-CM | POA: Diagnosis not present

## 2018-10-05 DIAGNOSIS — Z8249 Family history of ischemic heart disease and other diseases of the circulatory system: Secondary | ICD-10-CM | POA: Diagnosis not present

## 2018-10-05 DIAGNOSIS — Z833 Family history of diabetes mellitus: Secondary | ICD-10-CM | POA: Diagnosis not present

## 2018-10-05 DIAGNOSIS — I69391 Dysphagia following cerebral infarction: Secondary | ICD-10-CM | POA: Insufficient documentation

## 2018-10-05 NOTE — Progress Notes (Signed)
Bilateral knee injection  Indication: Knee OA not relieved by medication management and other conservative care.  Informed consent was obtained after describing risks and benefits of the procedure with the patient, this includes bleeding, bruising, infection and medication side effects. The patient wishes to proceed and has given written consent. Patient was placed in a seated position. The bilateral knee was marked and prepped with alcohol and betadine in the anterolateral knee. Ethylene Glycol was sprayed.  A 21-gauge 2 inch needle was inserted into the anterolateral knee. The right knee was injected without difficulty.  The left knee required needle reinsertion due to discomfort and then 6cc of Synvisc One solution was injected (bilaterally). A band aid was applied. The patient tolerated the procedure well. PROM was performed to the knee. Post procedure instructions were given to refrain from excessive activity to the knee for 48 hours.

## 2018-10-07 LAB — CUP PACEART REMOTE DEVICE CHECK
Date Time Interrogation Session: 20191125224104
Implantable Pulse Generator Implant Date: 20181203

## 2018-10-10 IMAGING — CR DG CHEST 2V
2 series · 2 of 2 positions shown · non-contrast
Comparison: Radiograph November 17, 2017.

CLINICAL DATA: Pneumonia.

EXAM:
CHEST  2 VIEW

[chest lat]
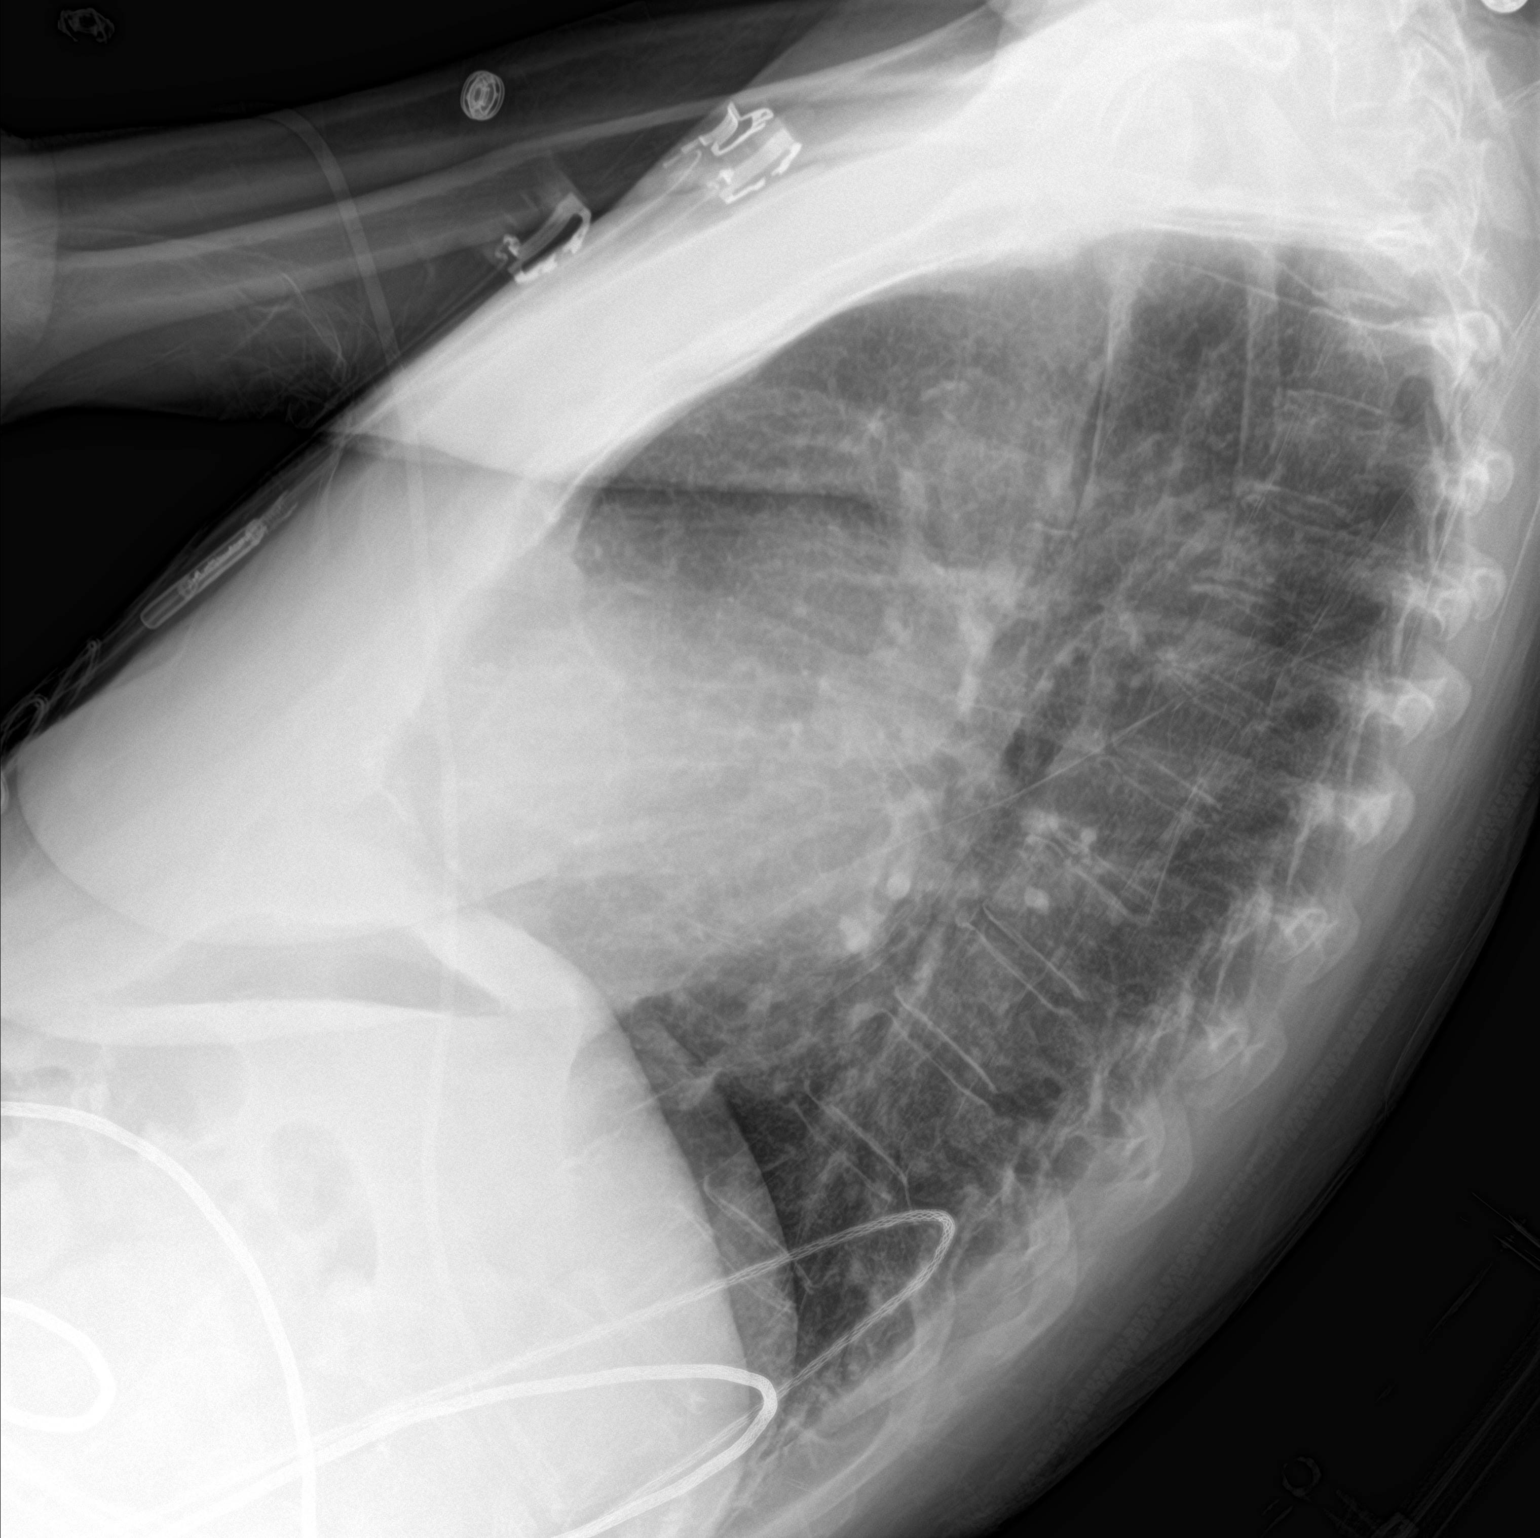

[chest ap]
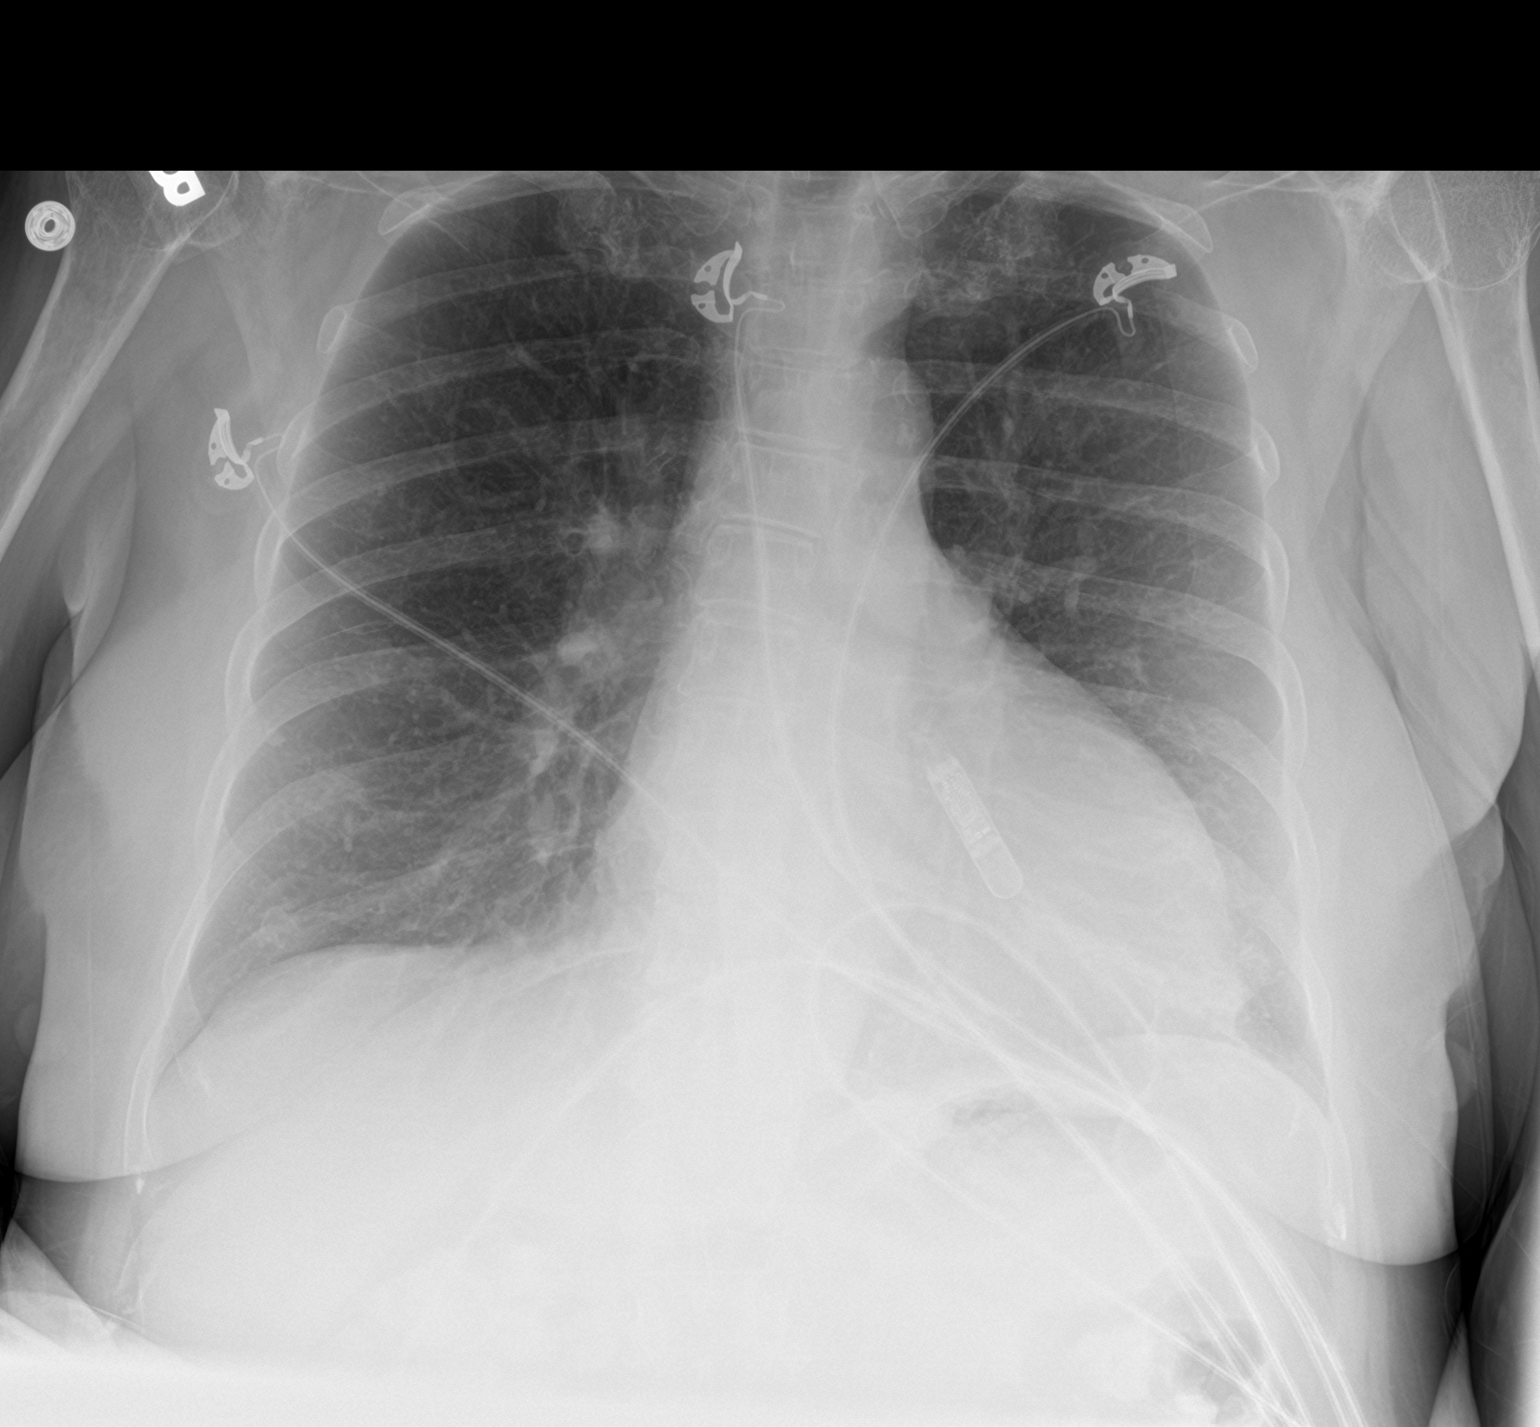

[2 of 2 positions shown; findings below may reference images not displayed]

FINDINGS: Stable cardiomegaly. No pneumothorax or pleural effusion is noted.
Right basilar opacity noted on prior exam appears to have resolved.
No acute pulmonary disease is noted. Bony thorax is unremarkable.
IMPRESSION: No active cardiopulmonary disease.

## 2018-10-18 IMAGING — DX DG CHEST 2V
2 series · 2 of 2 positions shown · non-contrast
Comparison: Portable chest x-ray November 26, 2017

CLINICAL DATA: Shortness of breath, drowsiness. History of coronary
artery disease and CABG, peripheral vascular disease, CVA, former
smoker.

EXAM:
CHEST - 2 VIEW

[chest lat]
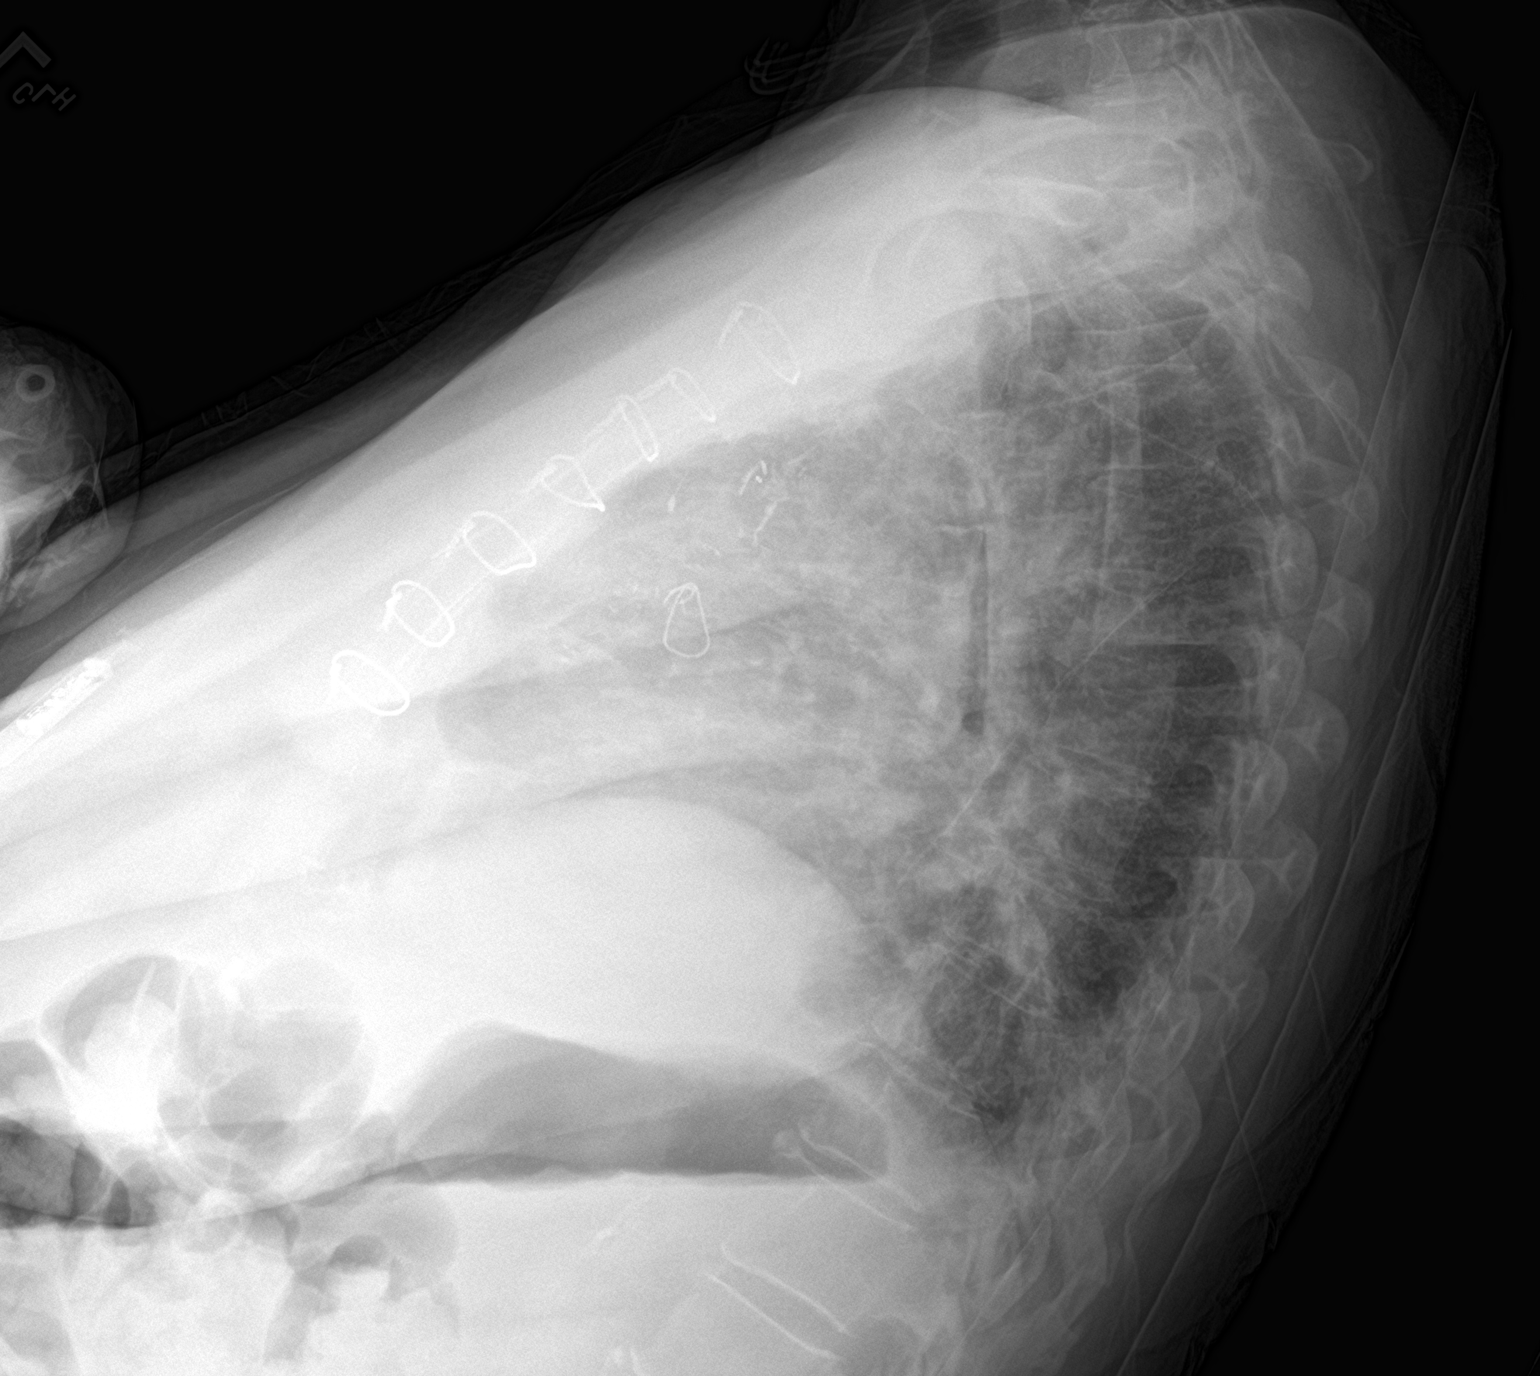

[chest ap]
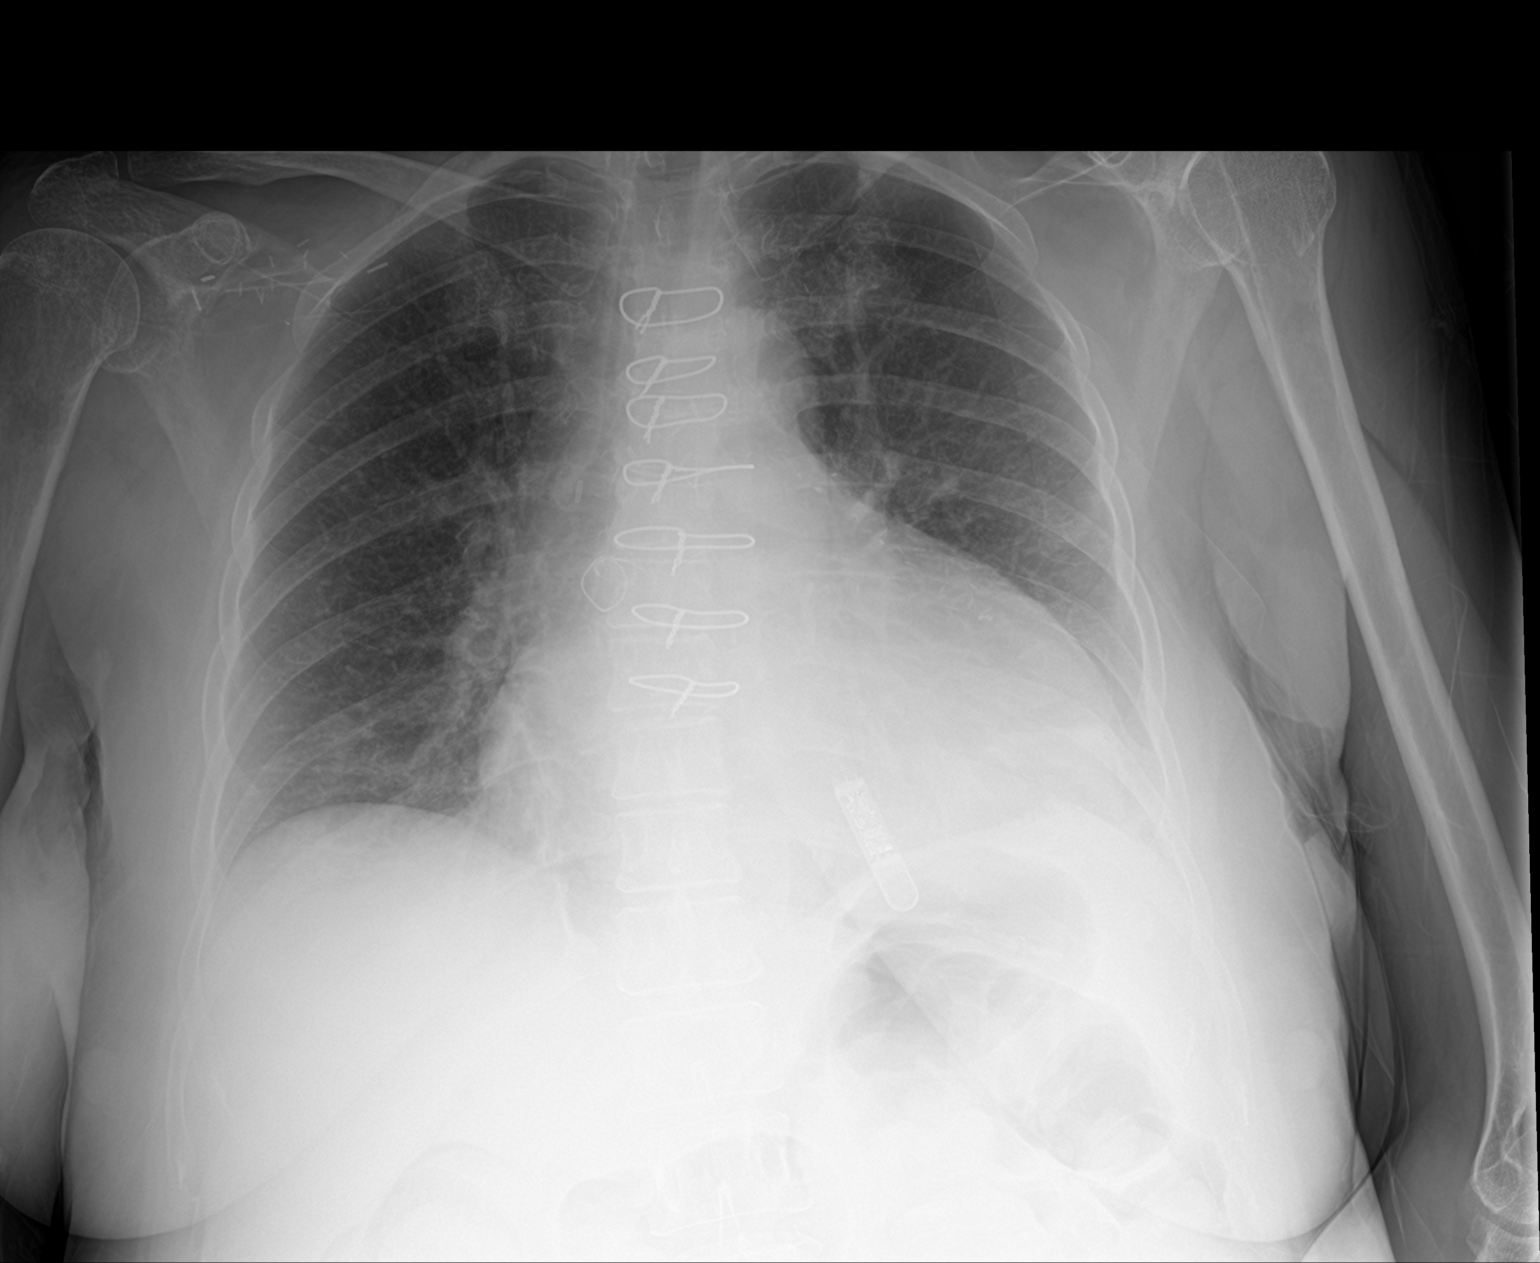

[2 of 2 positions shown; findings below may reference images not displayed]

FINDINGS: The lungs are adequately inflated. The interstitial edema has
markedly improved. The pulmonary vascularity is less engorged and
more distinct. The cardiac silhouette remains enlarged. The sternal
wires are intact. The left hemidiaphragm is better demonstrated.
There are coarse lung markings at the left lung base still.
IMPRESSION: Improving CHF with interval resolution of interstitial edema.
Persistent left lower lobe atelectasis or pneumonia.

## 2018-10-19 ENCOUNTER — Encounter: Payer: Self-pay | Admitting: Physical Medicine & Rehabilitation

## 2018-10-19 ENCOUNTER — Encounter (HOSPITAL_BASED_OUTPATIENT_CLINIC_OR_DEPARTMENT_OTHER): Payer: BLUE CROSS/BLUE SHIELD | Admitting: Physical Medicine & Rehabilitation

## 2018-10-19 ENCOUNTER — Other Ambulatory Visit: Payer: Self-pay

## 2018-10-19 VITALS — BP 99/41 | HR 94 | Ht 61.0 in | Wt 123.0 lb

## 2018-10-19 DIAGNOSIS — R269 Unspecified abnormalities of gait and mobility: Secondary | ICD-10-CM

## 2018-10-19 DIAGNOSIS — Z8249 Family history of ischemic heart disease and other diseases of the circulatory system: Secondary | ICD-10-CM | POA: Diagnosis not present

## 2018-10-19 DIAGNOSIS — Z888 Allergy status to other drugs, medicaments and biological substances status: Secondary | ICD-10-CM | POA: Diagnosis not present

## 2018-10-19 DIAGNOSIS — I69351 Hemiplegia and hemiparesis following cerebral infarction affecting right dominant side: Secondary | ICD-10-CM | POA: Diagnosis not present

## 2018-10-19 DIAGNOSIS — G8191 Hemiplegia, unspecified affecting right dominant side: Secondary | ICD-10-CM

## 2018-10-19 DIAGNOSIS — M170 Bilateral primary osteoarthritis of knee: Secondary | ICD-10-CM

## 2018-10-19 DIAGNOSIS — G8110 Spastic hemiplegia affecting unspecified side: Secondary | ICD-10-CM

## 2018-10-19 DIAGNOSIS — I63512 Cerebral infarction due to unspecified occlusion or stenosis of left middle cerebral artery: Secondary | ICD-10-CM

## 2018-10-19 DIAGNOSIS — I69391 Dysphagia following cerebral infarction: Secondary | ICD-10-CM | POA: Diagnosis not present

## 2018-10-19 DIAGNOSIS — R1310 Dysphagia, unspecified: Secondary | ICD-10-CM | POA: Diagnosis not present

## 2018-10-19 DIAGNOSIS — I10 Essential (primary) hypertension: Secondary | ICD-10-CM

## 2018-10-19 DIAGNOSIS — I639 Cerebral infarction, unspecified: Secondary | ICD-10-CM

## 2018-10-19 DIAGNOSIS — Z833 Family history of diabetes mellitus: Secondary | ICD-10-CM | POA: Diagnosis not present

## 2018-10-19 DIAGNOSIS — R200 Anesthesia of skin: Secondary | ICD-10-CM | POA: Insufficient documentation

## 2018-10-19 DIAGNOSIS — Z87891 Personal history of nicotine dependence: Secondary | ICD-10-CM | POA: Diagnosis not present

## 2018-10-19 DIAGNOSIS — K5900 Constipation, unspecified: Secondary | ICD-10-CM | POA: Diagnosis not present

## 2018-10-19 MED ORDER — DICLOFENAC SODIUM 1 % TD GEL: 2.0000 g | Freq: Four times a day (QID) | TRANSDERMAL | 1 refills | Status: DC

## 2018-10-19 NOTE — Progress Notes (Signed)
Subjective:    Patient ID: [name], female    DOB: 1961-04-30, 58 y.o.   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    Last clinic visit 10/05/2018.  She had b/l Synvisc knee injections at that time.  Since that visit, she states improvement in pain. She is taking Baclofen BID, reminded again for TID dosing. Denies falls. She did not try Lidoderm patch. Xrays of knees reviewed.   Pain Inventory Average Pain 5 Pain Right Now 5 My pain is aching  In the last 24 hours, has pain interfered with the following? General activity 2 Relation with others 0 Enjoyment of life 0 What TIME of day is your pain at its worst? evening Sleep (in general) Good  Pain is worse with: standing Pain improves with: injections Relief from Meds: no pain meds  Mobility walk with assistance use a cane ability to climb steps?  yes do you drive?  yes Do you have any goals in this area?  no  Function retired I need assistance with the following:  meal prep, household duties and shopping  Neuro/Psych trouble walking  Prior Studies Any changes since last visit?  no  Physicians involved in your care Any changes since last visit?  no   Family History  Problem Relation Age of Onset  . Heart attack Father   . Hypertension Mother   . Hyperlipidemia Mother   . Diabetes type II Mother   . Hypertension Brother   . Breast cancer Cousin 51   Social History   Socioeconomic History  . Marital status: Married    Spouse name: Not on file  . Number of children: 2  . Years of education: Not on file  . Highest education level: Not on file  Occupational History  . Not on file  Social Needs  . Financial resource strain: Not on file  . Food insecurity:    Worry: Not on file    Inability:  Not on file  . Transportation needs:    Medical: Not on file    Non-medical: Not on file  Tobacco Use  . Smoking status: Former Smoker    Packs/day: 0.12    Years: 37.00    Pack years: 4.44    Types: Cigarettes    Last attempt to quit: 08/21/2017    Years since quitting: 1.1  . Smokeless tobacco: Never Used  Substance and Sexual Activity  . Alcohol use: No    Frequency: Never  . Drug use: No  . Sexual activity: Not on file  Lifestyle  . Physical activity:    Days per week: Not on file    Minutes per session: Not on file  . Stress: Not on file  Relationships  . Social connections:    Talks on phone: Patient refused    Gets together: Patient refused    Attends religious service: Patient refused    Active member of club or organization: Patient refused    Attends meetings of clubs or organizations: Patient refused    Relationship status: Patient refused  Other Topics Concern  . Not on file  Social History Narrative   Married. 2 children.    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    Walks with cane or wheelchair.    Former smoker.    Drinks caffeine.  Smoke alarm in the home.   Firearms in the home.    Wears her seatbelt.    Feels safe In her relationships.    Past Surgical History:  Procedure Laterality Date  . CORONARY ARTERY BYPASS GRAFT N/A 11/23/2017   Procedure: CORONARY ARTERY BYPASS GRAFTING (CABG) x 2, ON PUMP, USING LEFT INTERNAL MAMMARY ARTERY AND RIGHT GREATER SAPHENOUS VEIN HARVESTED ENDOSCOPICALLY;  Surgeon: Gaye Pollack, MD;  Location: [place];  Service: Open Heart Surgery;  Laterality: N/A;  . CRYOABLATION     cervical  . IR ANGIO INTRA EXTRACRAN SEL COM CAROTID INNOMINATE UNI R MOD SED  08/21/2017  . IR ANGIO VERTEBRAL SEL SUBCLAVIAN INNOMINATE UNI R MOD SED  08/21/2017  . IR PERCUTANEOUS ART THROMBECTOMY/INFUSION INTRACRANIAL INC DIAG ANGIO  08/21/2017  . IR RADIOLOGIST EVAL & MGMT  10/30/2017  . LOOP RECORDER INSERTION N/A 08/28/2017   Procedure: LOOP  RECORDER INSERTION;  Surgeon: Constance Haw, MD;  Location: [place] [facility];  Service: Cardiovascular;  Laterality: N/A;  . RADIOLOGY WITH ANESTHESIA N/A 08/21/2017   Procedure: RADIOLOGY WITH ANESTHESIA;  Surgeon: Luanne Bras, MD;  Location: [place];  Service: Radiology;  Laterality: N/A;  . RIGHT/LEFT HEART CATH AND CORONARY ANGIOGRAPHY N/A 11/20/2017   Procedure: RIGHT/LEFT HEART CATH AND CORONARY ANGIOGRAPHY;  Surgeon: Jettie Booze, MD;  Location: [place] [facility];  Service: Cardiovascular;  Laterality: N/A;  . TEE WITHOUT CARDIOVERSION N/A 08/28/2017   Procedure: TRANSESOPHAGEAL ECHOCARDIOGRAM (TEE);  Surgeon: Fay Records, MD;  Location: [place];  Service: Cardiovascular;  Laterality: N/A;  . TEE WITHOUT CARDIOVERSION N/A 11/23/2017   Procedure: TRANSESOPHAGEAL ECHOCARDIOGRAM (TEE);  Surgeon: Gaye Pollack, MD;  Location: [place];  Service: Open Heart Surgery;  Laterality: N/A;  . TUBAL LIGATION     Past Medical History:  Diagnosis Date  . Arthritis   . CAD (coronary artery disease)    a. s/p NSTEMI in 10/2017 with cath showing LM disease --> s/p CABG on 11/23/2017 with LIMA-LAD and SVG-OM.   [name] Hay fever   . Ischemic cardiomyopathy    a. EF 35-40% by echo in 10/2017  . NSTEMI (non-ST elevated myocardial infarction) ([place])   . PAF (paroxysmal atrial fibrillation) ([place])    Diagnosed by ILR  . Pneumonia 11/16/2017  . Stroke ([employer]) 08/23/2017   Left MCA infarct status post TPA and thrombectomy   BP (!) 99/41   Pulse 94   Wt 123 lb (55.8 kg)   LMP  (LMP Unknown)   SpO2 93%   BMI 23.24 kg/m   Opioid Risk Score:   Fall Risk Score:  `1  Depression screen PHQ 2/9  Depression screen [facility] 2/9 10/19/2018 11/10/2017 11/10/2017 10/13/2017 10/05/2017 10/05/2017  Decreased Interest 0 0 0 0 1 0  Down, Depressed, Hopeless 0 0 0 0 0 0  PHQ - 2 Score 0 0 0 0 1 0  Altered sleeping - 0 - - 0 -  Tired, decreased energy - 0 - - 2 -  Change in appetite - 0 - - 0 -    Feeling bad or failure about yourself  - 0 - - 0 -  Trouble concentrating - 0 - - 1 -  Moving slowly or fidgety/restless - 0 - - 1 -  Suicidal thoughts - 0 - - 0 -  PHQ-9 Score - 0 - - 5 -  Difficult doing work/chores - Not difficult at all - - Not difficult at all -    Review  of Systems  HENT: Negative.   Eyes: Negative.   Respiratory: Negative.   Gastrointestinal: Negative.   Endocrine: Negative.   Genitourinary: Negative.   Musculoskeletal: Positive for arthralgias, gait problem and myalgias.  Skin: Negative.   Allergic/Immunologic: Negative.   Neurological: Positive for weakness and numbness.  Hematological: Negative.   Psychiatric/Behavioral: Negative.   All other systems reviewed and are negative.     Objective:   Physical Exam Constitutional: She appears well-developed and well-nourished.  HENT: Atraumatic. Normocephalic. Eyes: EOMI. No discharge.  Cardiovascular: RRR. No JVD   Respiratory: Normal effort and clear.   GI: Bowel sounds are normal. Non-distended Musculoskeletal: No edema or tenderness in extremities. Gait: Hemiplegic gait Negative and limited Phalen's, neg Durkin's, Tinnels at wrist on LUE Neurological: She is alert and oriented.  Motor: Right upper extremity: 3+/5 shoulder abduction, 4-/5 elbow flex/ext, 4-/5 hand grip  Right lower extremity: Hip flexion 4-4+/5, knee extension 4-4+/5, ADF/PF 4-4+/5  MAS: elbow flexors 1/4, wrist ext 1/4, finger flexors 1/4, ankle plantor flexors 1/4 Skin: Warm and dry.  Intact.  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  1. 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 Status post loop recorder  Cont  HEP  Patient states more discomfort/irration PRAFO/WHO/AFO  Cont Fluoxetine  She followed up with Vascular, no further intervention at present  Will refer for therapies again  2. Spastic hemiplegia  Cont Baclofen to 10 TID (typically takes BID), reminded again  Will hold off on Botox at this time  3. Neurologic gait abnormality  With foot drag, improved with cognizant ambulation  Cont HEP  Cont quad cane  Encouraged use of AFO  Referral for therapies  4. Bilateral knee OA  B/l Zilretta injections performed on 03/23/18 - with good benefit  Encouraged trial Lidoderm patch, reminded again  Xrays reviewed, medial > lateral OA b/l  Synvisc b/l on 10/05/2018 with benefit  Will order Voltaren gel  5. Left hand numbness/tingling  Will schedule for NCS/EMG  Will consider bracing   Will consider Gabapentin

## 2018-10-22 ENCOUNTER — Ambulatory Visit: Payer: BLUE CROSS/BLUE SHIELD | Admitting: Family Medicine

## 2018-10-22 ENCOUNTER — Encounter: Payer: Self-pay | Admitting: Family Medicine

## 2018-10-22 VITALS — BP 138/84 | HR 97 | Temp 97.7°F | Resp 16 | Ht 62.0 in | Wt 122.1 lb

## 2018-10-22 DIAGNOSIS — E785 Hyperlipidemia, unspecified: Secondary | ICD-10-CM | POA: Diagnosis not present

## 2018-10-22 DIAGNOSIS — G8191 Hemiplegia, unspecified affecting right dominant side: Secondary | ICD-10-CM

## 2018-10-22 DIAGNOSIS — L508 Other urticaria: Secondary | ICD-10-CM

## 2018-10-22 DIAGNOSIS — Z23 Encounter for immunization: Secondary | ICD-10-CM | POA: Diagnosis not present

## 2018-10-22 DIAGNOSIS — I480 Paroxysmal atrial fibrillation: Secondary | ICD-10-CM

## 2018-10-22 DIAGNOSIS — I25810 Atherosclerosis of coronary artery bypass graft(s) without angina pectoris: Secondary | ICD-10-CM

## 2018-10-22 DIAGNOSIS — I63512 Cerebral infarction due to unspecified occlusion or stenosis of left middle cerebral artery: Secondary | ICD-10-CM

## 2018-10-22 DIAGNOSIS — I700 Atherosclerosis of aorta: Secondary | ICD-10-CM

## 2018-10-22 DIAGNOSIS — E611 Iron deficiency: Secondary | ICD-10-CM

## 2018-10-22 DIAGNOSIS — I10 Essential (primary) hypertension: Secondary | ICD-10-CM

## 2018-10-22 DIAGNOSIS — K5900 Constipation, unspecified: Secondary | ICD-10-CM

## 2018-10-22 DIAGNOSIS — Z951 Presence of aortocoronary bypass graft: Secondary | ICD-10-CM

## 2018-10-22 DIAGNOSIS — I5042 Chronic combined systolic (congestive) and diastolic (congestive) heart failure: Secondary | ICD-10-CM

## 2018-10-22 DIAGNOSIS — F419 Anxiety disorder, unspecified: Secondary | ICD-10-CM

## 2018-10-22 LAB — LIPID PANEL
Cholesterol: 177 mg/dL (ref 0–200)
HDL: 49.4 mg/dL (ref >39.00)
LDL CALC: 94 mg/dL (ref 0–99)
NonHDL: 127.68
Total CHOL/HDL Ratio: 4
Triglycerides: 166 mg/dL — ABNORMAL HIGH (ref 0.0–149.0)
VLDL: 33.2 mg/dL (ref 0.0–40.0)

## 2018-10-22 MED ORDER — BISACODYL 10 MG RE SUPP: 10.0000 mg | RECTAL | 0 refills | Status: DC | PRN

## 2018-10-22 MED ORDER — FUROSEMIDE 20 MG PO TABS: 10.0000 mg | ORAL_TABLET | Freq: Every day | ORAL | 1 refills | Status: DC

## 2018-10-22 MED ORDER — ESCITALOPRAM OXALATE 10 MG PO TABS: 10.0000 mg | ORAL_TABLET | Freq: Every day | ORAL | 1 refills | Status: DC

## 2018-10-22 MED ORDER — POTASSIUM CHLORIDE ER 20 MEQ PO TBCR: 20.0000 meq | EXTENDED_RELEASE_TABLET | Freq: Every day | ORAL | 1 refills | Status: DC

## 2018-10-22 NOTE — Patient Instructions (Addendum)
Constipation: start miralax once cap in morning, one cap evening each in a full glass of water.  After your bowel move- cut back to 1/2 cap or 1 cap a day while using baclofen.  I have also called suppositories if you have no BM in 48 hours.   Monitor your BP after you have a Bowel movement- if still above 135/85 routinely then call your cardiologist to be seen.   I have refilled your meds we complete.   Follow up in 6 months as long as not needed sooner.    It was great to see you today.

## 2018-10-22 NOTE — Progress Notes (Signed)
Patient ID: [name], female  DOB: 08/11/1961, 58 y.o.   MRN: [id number] Patient Care Team    Relationship Specialty Notifications Start End  [name], DO PCP - General Family Medicine  10/13/17   Constance Haw, MD PCP - Electrophysiology Cardiology Admissions 08/28/17   Satira Sark, MD PCP - Cardiology Cardiology Admissions 12/15/17   Rosalin Hawking, MD Consulting Physician Neurology  10/13/17   Jamse Arn, MD Consulting Physician Physical Medicine and Rehabilitation  10/13/17   Luanne Bras, MD Consulting Physician Interventional Radiology  10/13/17     Chief Complaint  Patient presents with  . Follow-up    Not fasting. Pt has not had bowel movement x3 days, small one this AM. Abdominal pain with bloating.     Subjective:  [name] is a 58 y.o.  female present for follow up on chronic conditions.   Urticaria/Stress: Patient has continued her Lexapro without side effects.  He is not using the Xyzal as routinely.  She has not had any additional re-urticaria. Prior note:  Patient reports she has had chronic urticaria since the hospitalization for her stroke in November. She has tried discontinuing her medications and there seems to be no offending agents. Her urticaria persisted despite her not taking her medications. Patient states she doesn't feel particularly anxious but she understands her body is going through a great deal of stress. She denies any changes in personal hygiene products. She continues to have hives.    Cerebrovascular accident (CVA) due to occlusion of left middle cerebral artery (HCC)/ Right hemiplegia (HCC)/HLD/NSTEMI/ chronic CHF/ A fib/former smoker/Iron deficiency Pt reports compliance with coreg 3.125 (1/2 tab) BID, lasix 10 mg QD, xarelto 20 mg a day, Entresto 24-26 mg. She is taking her iron supplement once a day. Blood pressures ranges at home WNL. Patient denies chest pain, shortness of breath, dizziness or lower  extremity edema.  . She is on a a statin.  Prior note: Patient suffered from a left middle cerebral artery CVA 08/21/2017. She has completed inpatient and home health physical therapy. Right-sided upper and lower extremity deficits remain. She reports very minimal use of her right arm. She reports weakness with her right lower extremity but is able to walk with assistance for short periods if needed. She has been prescribed aspirin 325 mg a day. She also was prescribed Lipitor, however this is giving her diarrhea since she has stopped using it. She is established with neurology and cardiology and has a loop recorder. She reports she needs a referral to outpatient neuro rehabilitation. She reports she has quit smoking since having a stroke.  Depression screen [facility] 2/9 10/22/2018 10/22/2018 10/19/2018 11/10/2017 11/10/2017  Decreased Interest 0 0 0 0 0  Down, Depressed, Hopeless 0 0 0 0 0  PHQ - 2 Score 0 0 0 0 0  Altered sleeping 0 - - 0 -  Tired, decreased energy 1 - - 0 -  Change in appetite 0 - - 0 -  Feeling bad or failure about yourself  0 - - 0 -  Trouble concentrating 0 - - 0 -  Moving slowly or fidgety/restless 0 - - 0 -  Suicidal thoughts 0 - - 0 -  PHQ-9 Score 1 - - 0 -  Difficult doing work/chores Not difficult at all - - Not difficult at all -   GAD 7 : Generalized Anxiety Score 10/22/2018 11/10/2017  Nervous, Anxious, on Edge 0 0  Control/stop worrying  0 0  Worry too much - different things 0 0  Trouble relaxing 0 0  Restless 0 0  Easily annoyed or irritable 0 0  Afraid - awful might happen 0 0  Total GAD 7 Score 0 0  Anxiety Difficulty Not difficult at all Not difficult at all          Fall Risk  10/19/2018 10/05/2018 09/20/2018 07/04/2018 03/23/2018  Falls in the past year? 0 0 1 No No  Number falls in past yr: - - 0 - -  Injury with Fall? - - 0 - -   Immunization History  Administered Date(s) Administered  . Influenza,inj,Quad PF,6+ Mos 10/13/2017, 10/22/2018  .  Influenza,inj,quad, With Preservative 08/24/2017    No exam data present  Past Medical History:  Diagnosis Date  . Arthritis   . CAD (coronary artery disease)    a. s/p NSTEMI in 10/2017 with cath showing LM disease --> s/p CABG on 11/23/2017 with LIMA-LAD and SVG-OM.   [name] Hay fever   . Ischemic cardiomyopathy    a. EF 35-40% by echo in 10/2017  . NSTEMI (non-ST elevated myocardial infarction) ([place])   . PAF (paroxysmal atrial fibrillation) ([place])    Diagnosed by ILR  . Pneumonia 11/16/2017  . Stroke [facility]) 08/23/2017   Left MCA infarct status post TPA and thrombectomy   Allergies  Allergen Reactions  . Lopressor [Metoprolol Tartrate] Swelling    Angioedema  . Nsaids     On Xarelto  . Lipitor [Atorvastatin] Diarrhea   Past Surgical History:  Procedure Laterality Date  . CORONARY ARTERY BYPASS GRAFT N/A 11/23/2017   Procedure: CORONARY ARTERY BYPASS GRAFTING (CABG) x 2, ON PUMP, USING LEFT INTERNAL MAMMARY ARTERY AND RIGHT GREATER SAPHENOUS VEIN HARVESTED ENDOSCOPICALLY;  Surgeon: Gaye Pollack, MD;  Location: [place];  Service: Open Heart Surgery;  Laterality: N/A;  . CRYOABLATION     cervical  . IR ANGIO INTRA EXTRACRAN SEL COM CAROTID INNOMINATE UNI R MOD SED  08/21/2017  . IR ANGIO VERTEBRAL SEL SUBCLAVIAN INNOMINATE UNI R MOD SED  08/21/2017  . IR PERCUTANEOUS ART THROMBECTOMY/INFUSION INTRACRANIAL INC DIAG ANGIO  08/21/2017  . IR RADIOLOGIST EVAL & MGMT  10/30/2017  . LOOP RECORDER INSERTION N/A 08/28/2017   Procedure: LOOP RECORDER INSERTION;  Surgeon: Constance Haw, MD;  Location: [place] [facility];  Service: Cardiovascular;  Laterality: N/A;  . RADIOLOGY WITH ANESTHESIA N/A 08/21/2017   Procedure: RADIOLOGY WITH ANESTHESIA;  Surgeon: Luanne Bras, MD;  Location: [place];  Service: Radiology;  Laterality: N/A;  . RIGHT/LEFT HEART CATH AND CORONARY ANGIOGRAPHY N/A 11/20/2017   Procedure: RIGHT/LEFT HEART CATH AND CORONARY ANGIOGRAPHY;  Surgeon: Jettie Booze,  MD;  Location: [place] [facility];  Service: Cardiovascular;  Laterality: N/A;  . TEE WITHOUT CARDIOVERSION N/A 08/28/2017   Procedure: TRANSESOPHAGEAL ECHOCARDIOGRAM (TEE);  Surgeon: Fay Records, MD;  Location: [place];  Service: Cardiovascular;  Laterality: N/A;  . TEE WITHOUT CARDIOVERSION N/A 11/23/2017   Procedure: TRANSESOPHAGEAL ECHOCARDIOGRAM (TEE);  Surgeon: Gaye Pollack, MD;  Location: [place];  Service: Open Heart Surgery;  Laterality: N/A;  . TUBAL LIGATION     Family History  Problem Relation Age of Onset  . Heart attack Father   . Hypertension Mother   . Hyperlipidemia Mother   . Diabetes type II Mother   . Hypertension Brother   . Breast cancer Cousin 18   Social History   Socioeconomic History  . Marital status: Married  Spouse name: Not on file  . Number of children: 2  . Years of education: Not on file  . Highest education level: Not on file  Occupational History  . Not on file  Social Needs  . Financial resource strain: Not on file  . Food insecurity:    Worry: Not on file    Inability: Not on file  . Transportation needs:    Medical: Not on file    Non-medical: Not on file  Tobacco Use  . Smoking status: Former Smoker    Packs/day: 0.12    Years: 37.00    Pack years: 4.44    Types: Cigarettes    Last attempt to quit: 08/21/2017    Years since quitting: 1.1  . Smokeless tobacco: Never Used  Substance and Sexual Activity  . Alcohol use: No    Frequency: Never  . Drug use: No  . Sexual activity: Not on file  Lifestyle  . Physical activity:    Days per week: Not on file    Minutes per session: Not on file  . Stress: Not on file  Relationships  . Social connections:    Talks on phone: Patient refused    Gets together: Patient refused    Attends religious service: Patient refused    Active member of club or organization: Patient refused    Attends meetings of clubs or organizations: Patient refused    Relationship status: Patient  refused  . Intimate partner violence:    Fear of current or ex partner: Patient refused    Emotionally abused: Patient refused    Physically abused: Patient refused    Forced sexual activity: Patient refused  Other Topics Concern  . Not on file  Social History Narrative   Married. 2 children.    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    Walks with cane or wheelchair.    Former smoker.    Drinks caffeine.    Smoke alarm in the home.   Firearms in the home.    Wears her seatbelt.    Feels safe In her relationships.    Allergies as of 10/22/2018      Reactions   Lopressor [metoprolol Tartrate] Swelling   Angioedema   Lipitor [atorvastatin] Diarrhea      Medication List       Accurate as of October 22, 2018 11:59 PM. Always use your most recent med list.        baclofen 10 MG tablet Commonly known as:  LIORESAL Take 1 tablet (10 mg total) by mouth 3 (three) times daily.   bisacodyl 10 MG suppository Commonly known as:  DULCOLAX Place 1 suppository (10 mg total) rectally as needed for moderate constipation.   calcium carbonate 500 MG chewable tablet Commonly known as:  TUMS - dosed in mg elemental calcium Chew 1 tablet by mouth daily.   carvedilol 3.125 MG tablet Commonly known as:  COREG Take 1 tablet (3.125 mg total) by mouth 2 (two) times daily.   cholecalciferol 1000 units tablet Commonly known as:  VITAMIN D Take 1,000 Units by mouth daily.   diclofenac sodium 1 % Gel Commonly known as:  VOLTAREN Apply 2 g topically 4 (four) times daily.   escitalopram 10 MG tablet Commonly known as:  LEXAPRO Take 1 tablet (10 mg total) by mouth daily.   famotidine 20 MG tablet Commonly known as:  PEPCID Take 20 mg by mouth 2 (two) times daily.   ferrous sulfate 325 (65 FE) MG tablet Take  325 mg by mouth daily with breakfast.   furosemide 20 MG tablet Commonly known as:  LASIX Take 0.5 tablets (10 mg total) by mouth daily.   levocetirizine 5 MG tablet Commonly known  as:  XYZAL TAKE 1 TABLET BY MOUTH ONCE DAILY IN THE EVENING   Potassium Chloride ER 20 MEQ Tbcr Take 20 mEq by mouth daily.   rosuvastatin 20 MG tablet Commonly known as:  CRESTOR Take 1 tablet (20 mg total) by mouth daily.   sacubitril-valsartan 24-26 MG Commonly known as:  ENTRESTO Take 1 tablet by mouth 2 (two) times daily.   triamcinolone cream 0.1 % Commonly known as:  KENALOG Apply 1 application topically 2 (two) times daily.   vitamin B-12 100 MCG tablet Commonly known as:  CYANOCOBALAMIN Take 100 mcg by mouth daily.   XARELTO 20 MG Tabs tablet Generic drug:  rivaroxaban TAKE 1 TABLET BY MOUTH ONCE DAILY WITH SUPPER       All past medical history, surgical history, allergies, family history, immunizations andmedications were updated in the EMR today and reviewed under the history and medication portions of their EMR.      No results found.   ROS: 14 pt review of systems performed and negative (unless mentioned in an HPI)  Objective: BP 138/84 (BP Location: Left Arm, Patient Position: Sitting, Cuff Size: Normal)   Pulse 97   Temp 97.7 F (36.5 C) (Oral)   Resp 16   Ht 5\' 2"  (1.575 m)   Wt 122 lb 2 oz (55.4 kg)   LMP  (LMP Unknown)   SpO2 95%   BMI 22.34 kg/m   Gen: Afebrile. No acute distress.  Nontoxic. HENT: AT. [place].  MMM.  Eyes:Pupils Equal Round Reactive to light, Extraocular movements intact,  Conjunctiva without redness, discharge or icterus. Neck/lymp/endocrine: Supple, no lymphadenopathy, no thyromegaly CV: RRR 1/6 systolic murmur, no edema Chest: CTAB, no wheeze or crackles Abd: Soft.  Flat. NTND. BS present.  No masses palpated.  Skin: No rashes, purpura or petechiae.  Neuro: Unchanged gait, uses cane. PERLA. EOMi. Alert. Oriented x3  Psych: Normal affect, dress and demeanor. Normal speech. Normal thought content and judgment..    Assessment/plan: [name] is a 58 y.o. female present for establishment of care. Urticaria/Anxiety -  continue  xyzal RN -Continue Lexapro 10mg .  Refills provided today. - f/u 6 months if needing refills  Cerebrovascular accident (CVA) due to occlusion of left middle cerebral artery (HCC)/ Right hemiplegia (HCC)/HLD/NSTEMI/ chronic CHF/ A fib/former smoker/Iron deficiency/atherosclerosis of aorta/CAD/CABG x2 - continue iron daily - Continue coreg, Ernesto, Xarelto, lasix  10 mg QD -Continue Crestor, dose will be altered if needed after lipid results collected today.  LDL less than 70 desired.   .  -Monitor blood pressure.  Currently a little above goal but she is having some abdominal discomfort secondary to constipation.  If remains above 135/85 she needs to be seen either by her cardiologist or myself to adjust medications. - continue potassium while on diuretic - Patient is established with neurology and cardiology. Follow-up routinely as advised with her specialist.  - still working with outpt PT; doing well with right-sided deficits remaining, but improving. Follow-up 6 months  Constipation: Patient reported constipation of 3 days.  Advised her to start MiraLAX per.  Could be secondary to increase in her baclofen.  Also called in suppositories if no BM within 48 hours can use.    Influenza shot provided today.  Return in about 6 months (around 04/22/2019)  for HTN, HLD.  Orders Placed This Encounter  Procedures  . Flu Vaccine QUAD 36+ mos IM  . Lipid panel  . Iron, TIBC and Ferritin Panel     Note is dictated utilizing voice recognition software. Although note has been proof read prior to signing, occasional typographical errors still can be missed. If any questions arise, please do not hesitate to call for verification.  Electronically signed by: Howard Pouch, DO [place]

## 2018-10-23 ENCOUNTER — Telehealth: Payer: Self-pay | Admitting: Family Medicine

## 2018-10-23 LAB — IRON,TIBC AND FERRITIN PANEL
%SAT: 30 % (calc) (ref 16–45)
Ferritin: 59 ng/mL (ref 16–232)
Iron: 91 ug/dL (ref 45–160)
TIBC: 307 mcg/dL (calc) (ref 250–450)

## 2018-10-23 MED ORDER — ROSUVASTATIN CALCIUM 40 MG PO TABS: 40.0000 mg | ORAL_TABLET | Freq: Every day | ORAL | 3 refills | Status: DC

## 2018-10-23 NOTE — Telephone Encounter (Signed)
Left detailed message of results, new RX, and provider recommendations.  Okay for PEC to discuss if patient has any questions.

## 2018-10-23 NOTE — Telephone Encounter (Signed)
Patient's husband calling back for lab results, NT unavailable.

## 2018-10-23 NOTE — Telephone Encounter (Signed)
Patient's husband called and given results by Dr. Raoul Pitch as noted below on 10/23/18, he verbalized understanding.

## 2018-10-23 NOTE — Telephone Encounter (Signed)
Please inform patient the following information: Her iron panel is great. Continue what she is doing.  Her cholesterol overall is good, but her LDL goal is < 70 secondary to her history and she was at 94.  - I have increased her crestor dose to next dose. She can continue taking the bottle she has by taking 2. Once she gets the new bottle- then go back to one pill- total dose is now 40 mg of crestor

## 2018-10-24 ENCOUNTER — Encounter: Payer: Self-pay | Admitting: Family Medicine

## 2018-10-24 DIAGNOSIS — F419 Anxiety disorder, unspecified: Secondary | ICD-10-CM | POA: Insufficient documentation

## 2018-10-24 HISTORY — DX: Anxiety disorder, unspecified: F41.9

## 2018-10-25 ENCOUNTER — Ambulatory Visit (INDEPENDENT_AMBULATORY_CARE_PROVIDER_SITE_OTHER): Payer: BLUE CROSS/BLUE SHIELD

## 2018-10-25 DIAGNOSIS — I63512 Cerebral infarction due to unspecified occlusion or stenosis of left middle cerebral artery: Secondary | ICD-10-CM | POA: Diagnosis not present

## 2018-10-26 LAB — CUP PACEART REMOTE DEVICE CHECK
Date Time Interrogation Session: 20200130233741
Implantable Pulse Generator Implant Date: 20181203

## 2018-11-02 NOTE — Progress Notes (Signed)
[facility] Summary Report / Loop Recorder 

## 2018-11-06 ENCOUNTER — Telehealth: Payer: Self-pay | Admitting: *Deleted

## 2018-11-06 DIAGNOSIS — I63512 Cerebral infarction due to unspecified occlusion or stenosis of left middle cerebral artery: Secondary | ICD-10-CM

## 2018-11-06 DIAGNOSIS — G8110 Spastic hemiplegia affecting unspecified side: Secondary | ICD-10-CM

## 2018-11-06 NOTE — Telephone Encounter (Signed)
Mr Timberman called for his wife [name].  He says that Dr Posey Pronto ordered her PT in [place] but she needs OT too.  She had a stroke and they had previously requested neuro rehab.

## 2018-11-07 NOTE — Addendum Note (Signed)
Addended by: Caro Hight on: 11/07/2018 04:01 PM   Modules accepted: Orders

## 2018-11-07 NOTE — Telephone Encounter (Signed)
I did not select the location.  I asked them to decide where they would like the referral.  We can order OT as well.  Thanks.

## 2018-11-07 NOTE — Telephone Encounter (Signed)
Order placed and spoke with Mr Carel.

## 2018-11-13 ENCOUNTER — Other Ambulatory Visit: Payer: Self-pay | Admitting: Family Medicine

## 2018-11-13 NOTE — Telephone Encounter (Signed)
Copied from [place] [id number]. Topic: Quick Communication - Rx Refill/Question >> Nov 13, 2018  8:57 AM Ahmed Prima L wrote: Medication: famotidine (PEPCID) 20 MG tablet  Has the patient contacted their pharmacy? Yes, they told her to call office  (Agent: If no, request that the patient contact the pharmacy for the refill.) (Agent: If yes, when and what did the pharmacy advise?)  Preferred Pharmacy (with phone number or street name): [facility] [address], [place] - [address] [place] [address] Phone: [phone] Fax: [phone]    Agent: Please be advised that RX refills may take up to 3 business days. We ask that you follow-up with your pharmacy.

## 2018-11-13 NOTE — Telephone Encounter (Signed)
Requested medication (s) are due for refill today: ?  Requested medication (s) are on the active medication list: yes  Last refill:  02/11/18  Future visit scheduled: no  Notes to clinic: historical provider    Requested Prescriptions  Pending Prescriptions Disp Refills   famotidine (PEPCID) 20 MG tablet      Sig: Take 1 tablet (20 mg total) by mouth 2 (two) times daily.     Gastroenterology:  H2 Antagonists Passed - 11/13/2018  9:00 AM      Passed - Valid encounter within last 12 months    Recent Outpatient Visits          3 weeks ago Hyperlipidemia, unspecified hyperlipidemia type   [place], [name], DO   6 months ago Cerebrovascular accident (CVA) due to occlusion of left middle cerebral artery ([place])   [place], [name], DO   8 months ago [place], [name], DO   9 months ago Unintentional weight loss   [place], [name], DO   10 months ago Acute bilateral knee pain   [place], [name], DO

## 2018-11-14 NOTE — Telephone Encounter (Signed)
Talked with patient's husband, okay per DPR.  Pt is to CB and let us know who prescribed Pepcid for her in the past and why they can't fill it currently before routing RX refill to Dr Raoul Pitch.  Okay for PEC to discuss.

## 2018-11-15 NOTE — Telephone Encounter (Signed)
Pt calling back and states that Dr. Lucita Lora name is on her rx bottle. She states it may had been a hospital dr in the past. Please advise.

## 2018-11-16 NOTE — Telephone Encounter (Signed)
Contacted pharmacy, RX is from the hospital.  Patient advised to pick up RX as Pharmacy states it was ready for pick up. Pt voiced understanding.  She states she will talk w/ PCP about the need to continue that medication at her next OV.

## 2018-11-21 ENCOUNTER — Ambulatory Visit: Payer: BLUE CROSS/BLUE SHIELD | Attending: Physical Medicine & Rehabilitation | Admitting: Occupational Therapy

## 2018-11-21 ENCOUNTER — Other Ambulatory Visit: Payer: Self-pay

## 2018-11-21 ENCOUNTER — Encounter: Payer: Self-pay | Admitting: Occupational Therapy

## 2018-11-21 DIAGNOSIS — R2681 Unsteadiness on feet: Secondary | ICD-10-CM | POA: Insufficient documentation

## 2018-11-21 DIAGNOSIS — M6281 Muscle weakness (generalized): Secondary | ICD-10-CM | POA: Insufficient documentation

## 2018-11-21 DIAGNOSIS — R278 Other lack of coordination: Secondary | ICD-10-CM | POA: Insufficient documentation

## 2018-11-21 DIAGNOSIS — I69351 Hemiplegia and hemiparesis following cerebral infarction affecting right dominant side: Secondary | ICD-10-CM | POA: Insufficient documentation

## 2018-11-21 DIAGNOSIS — R293 Abnormal posture: Secondary | ICD-10-CM | POA: Insufficient documentation

## 2018-11-21 NOTE — Therapy (Signed)
[facility] [address] [place], [place], [address] Phone: [phone]   Fax:  [phone]  Occupational Therapy Evaluation  Patient Details  Name: [name] MRN: [id number] Date of Birth: [birth year] Referring Provider (OT): Jamse Arn   Encounter Date: 11/21/2018  OT End of Session - 11/21/18 1108    Visit Number  1    Number of Visits  8    Date for OT Re-Evaluation  12/19/18    Authorization Type  [employer] - pt has 30 VL combined with PT    OT Start Time  0936    OT Stop Time  1021    OT Time Calculation (min)  45 min    Activity Tolerance  Patient tolerated treatment well    Behavior During Therapy  [facility] for tasks assessed/performed       Past Medical History:  Diagnosis Date  . Arthritis   . CAD (coronary artery disease)    a. s/p NSTEMI in 10/2017 with cath showing LM disease --> s/p CABG on 11/23/2017 with LIMA-LAD and SVG-OM.   [name] Hay fever   . Ischemic cardiomyopathy    a. EF 35-40% by echo in 10/2017  . NSTEMI (non-ST elevated myocardial infarction) ([place])   . PAF (paroxysmal atrial fibrillation) ([place])    Diagnosed by ILR  . Pneumonia 11/16/2017  . Stroke [facility]) 08/23/2017   Left MCA infarct status post TPA and thrombectomy    Past Surgical History:  Procedure Laterality Date  . CORONARY ARTERY BYPASS GRAFT N/A 11/23/2017   Procedure: CORONARY ARTERY BYPASS GRAFTING (CABG) x 2, ON PUMP, USING LEFT INTERNAL MAMMARY ARTERY AND RIGHT GREATER SAPHENOUS VEIN HARVESTED ENDOSCOPICALLY;  Surgeon: Gaye Pollack, MD;  Location: [place];  Service: Open Heart Surgery;  Laterality: N/A;  . CRYOABLATION     cervical  . IR ANGIO INTRA EXTRACRAN SEL COM CAROTID INNOMINATE UNI R MOD SED  08/21/2017  . IR ANGIO VERTEBRAL SEL SUBCLAVIAN INNOMINATE UNI R MOD SED  08/21/2017  . IR PERCUTANEOUS ART THROMBECTOMY/INFUSION INTRACRANIAL INC DIAG ANGIO  08/21/2017  . IR RADIOLOGIST EVAL & MGMT  10/30/2017  . LOOP  RECORDER INSERTION N/A 08/28/2017   Procedure: LOOP RECORDER INSERTION;  Surgeon: Constance Haw, MD;  Location: [place] [facility];  Service: Cardiovascular;  Laterality: N/A;  . RADIOLOGY WITH ANESTHESIA N/A 08/21/2017   Procedure: RADIOLOGY WITH ANESTHESIA;  Surgeon: Luanne Bras, MD;  Location: [place];  Service: Radiology;  Laterality: N/A;  . RIGHT/LEFT HEART CATH AND CORONARY ANGIOGRAPHY N/A 11/20/2017   Procedure: RIGHT/LEFT HEART CATH AND CORONARY ANGIOGRAPHY;  Surgeon: Jettie Booze, MD;  Location: [facility] [facility];  Service: Cardiovascular;  Laterality: N/A;  . TEE WITHOUT CARDIOVERSION N/A 08/28/2017   Procedure: TRANSESOPHAGEAL ECHOCARDIOGRAM (TEE);  Surgeon: Fay Records, MD;  Location: [place];  Service: Cardiovascular;  Laterality: N/A;  . TEE WITHOUT CARDIOVERSION N/A 11/23/2017   Procedure: TRANSESOPHAGEAL ECHOCARDIOGRAM (TEE);  Surgeon: Gaye Pollack, MD;  Location: [facility];  Service: Open Heart Surgery;  Laterality: N/A;  . TUBAL LIGATION      There were no vitals filed for this visit.  Subjective Assessment - 11/21/18 0942    Subjective   I want to walk be able to better and use my R arm.     Patient is accompained by:  Family member   spouse   Pertinent History  L MCA CVA in 07/2017 s/p TPA, thrombectomy; PMHx: loop recorder, CAD, CABG 10/2017,  MI, bil knee OA    Patient Stated Goals  wants to walk better without AD; wants to be able to use R arm better, to write; less pain in L UE     Currently in Pain?  Yes    Pain Score  8     Pain Location  Hand    Pain Orientation  Left;Right   L>R   Pain Descriptors / Indicators  Tingling    Pain Type  Chronic pain    Pain Onset  More than a month ago    Pain Frequency  Constant    Aggravating Factors   worse when first wake up     Pain Relieving Factors  stretching, moving     Multiple Pain Sites  No        [facility] OT Assessment - 11/21/18 0001      Assessment   Medical Diagnosis  L MCA CVA     Referring Provider (OT)  Posey Pronto, Ankit Anil    Onset Date/Surgical Date  11/04/18   date of referral   Hand Dominance  Right    Prior Therapy  PT, OT (acute)      Precautions   Precautions  Fall      Restrictions   Weight Bearing Restrictions  No      Balance Screen   Has the patient fallen in the past 6 months  Yes    How many times?  1   will discuss PT with pt   Has the patient had a decrease in activity level because of a fear of falling?   No      Home  Environment   Family/patient expects to be discharged to:  Private residence    Living Arrangements  Spouse/significant other    Available Help at Discharge  Family    Type of [place]   1   [place]  One level    Bathroom Shower/Tub  Tub/Shower unit    Trail height    Additional Comments  has shower seat, quad cane     Lives With  Spouse      Prior Function   Level of Independence  Independent;Independent with transfers;Independent with gait    Vocation  --   homemaker   Leisure  [employer], shop, flowers - gardening      ADL   Eating/Feeding  Independent    Grooming  Independent    Upper Body Bathing  Modified independent   increased time   Lower Body Bathing  Modified independent   increased time    Upper Body Dressing  Needs assist for fasteners;Increased time   modified task for buttoning, front closure bras   Lower Body Dressing  Modified independent   elastic waistbands    Toilet Transfer  Modified independent    Toileting - Clothing Manipulation  Modified independent    Tub/Shower Transfer  Modified independent    ADL comments  requires increased time to perform ADLs (reports approx 1 hour) where pt reports recently took about 30 min to do so       IADL   Shopping  --   husband does shopping   Light Housekeeping  --   washes clothes    Meal Prep  --   spouse completes    Community Mobility  Relies on family or friends for transportation     Medication Management  Is responsible for taking medication  in correct dosages at correct time    Financial Management  Manages financial matters independently (budgets, writes checks, pays rent, bills goes to bank), collects and keeps track of income      Mobility   Mobility Status  History of falls    Mobility Status Comments  use of quad cane, will discuss PT with pt      Written Expression   Dominant Hand  Right      Vision - History   Baseline Vision  Wears glasses only for reading    Additional Comments  denies any visual changes      Activity Tolerance   Activity Tolerance  Tolerates 10-20 min activity with multiple rests      Cognition   Overall Cognitive Status  Within Functional Limits for tasks assessed      Posture/Postural Control   Posture/Postural Control  Postural limitations    Posture Comments  R scapula noted depressed, abducted, downward rotation; L bias      Sensation   Light Touch  Impaired by gross assessment   potential signs/symptoms of carpal tunnel in LUE (overuse?)   Hot/Cold  Appears Intact    Proprioception  Appears Intact      Coordination   Gross Motor Movements are Fluid and Coordinated  No    Fine Motor Movements are Fluid and Coordinated  No    Other  pt does not have enough proximal movement to perform standardized testing      Tone   Assessment Location  Right Upper Extremity      ROM / Strength   AROM / PROM / Strength  AROM;PROM;Strength      AROM   Overall AROM   Deficits    Overall AROM Comments  limited in R shoulder flexion (0-45*) and abduction (0-55*), both with compensation; external rotation to be assessed       PROM   Overall PROM   Deficits    Overall PROM Comments  R shoulder flexion grossly 0-120*, limited by tightness; R shoulder abduction grossly 0-90*, limited by tightness; external rotation to be assessed       Strength   Overall Strength  Unable to assess    Overall Strength Comments  unable to assess due to  altered tone, but proximal no greater than 2/5; see grip strength for grip strnegth deficitts      Hand Function   Right Hand Gross Grasp  Impaired    Right Hand Grip (lbs)  8    Left Hand Gross Grasp  Functional    Left Hand Grip (lbs)  55      RUE Tone   RUE Tone  Mild;Modified Ashworth      RUE Tone   Modified Ashworth Scale for Grading Hypertonia RUE  Slight increase in muscle tone, manifested by a catch and release or by minimal resistance at the end of the range of motion when the affected part(s) is moved in flexion or extension   increased flexor tone with functional ambulation                           OT Long Term Goals - 11/21/18 1043      OT LONG TERM GOAL #1   Title  Pt will be independent in updated HEP for RUE - 12/19/18     Status  New      OT LONG TERM GOAL #2   Title  Pt will self-report  able to complete morning ADL routine in 45 min or less.     Baseline  currently reports takes approx 1hr to perform     Status  New      OT LONG TERM GOAL #3   Title  Pt will demonstrate ability to perform simple cold meal prep at mod independent.     Status  New      OT LONG TERM GOAL #4   Title  Pt will demonstrate ability to use RUE as gross assist 25% of the time within basic self care.     Status  New      OT LONG TERM GOAL #5   Title  Pt will demonstrate ability for bil low reach with 50* shoulder flexion during functional activities.     Status  New      Long Term Additional Goals   Additional Long Term Goals  Yes      OT LONG TERM GOAL #6   Title  Pt will demonstrate increased grip strength of 3lb or more in RUE to assist with bil reaching task.     Baseline  8lb     Status  New      OT LONG TERM GOAL #7   Title  Pt will verbalize understanding for RUE positionig in bed in preparation for returning to sleeping in bed vs recliner.     Status  New            Plan - 11/21/18 1013    Clinical Impression Statement  58 y/o female with  L MCA CVA in 07/2017 who now presents with decreased balance, abnormal postural alignment, decreased functional use of RUE and RUE deficits including mild hypertonicity, decreased A/PROM, decreased UE strength and weak grip strength all of which impact pt's ability to perform meaningful functional activities. PTA pt was completing ADL, iADL and functional ambulation independently. Pt will benefit from skilled clinical intervention to address current deficits and to maximize her overall functional independence.     Occupational Profile and client history currently impacting functional performance  wife, homemaker, enjoys flowers/gardening  PMH: L MCA CVA (07/2017) s/p TPA, thrombectomy, loop recorder, CAD, CABG, cardiac debility, MI, tobacco use, HTN, bil knee OA    Occupational performance deficits (Please refer to evaluation for details):  ADL's;Leisure;IADL's;Rest and Sleep;Social Participation    Rehab Potential  Good    Current Impairments/barriers affecting progress:  increased time since pt's CVA (CVA in 07/2017) (potential barrier)     OT Frequency  2x / week    OT Duration  4 weeks    OT Treatment/Interventions  Self-care/ADL training;Therapeutic exercise;Balance training;Fluidtherapy;Manual Therapy;Neuromuscular education;Patient/family education;Therapeutic activities;Passive range of motion;Energy conservation;DME and/or AE instruction;Functional Mobility Training;Splinting;Aquatic Therapy;Moist Heat    Plan  assess external rotation; review previous UE HEP and update/revise PRN., NMR for RUE/trunk, postural alignment     Clinical Decision Making  Multiple treatment options, significant modification of task necessary    Recommended Other Services  discussed completing PT eval with pt/pt's spouse -pt in agreeement and will schedule PT Eval    Consulted and Agree with Plan of Care  Patient;Family member/caregiver    Family Member Consulted  husband       Patient will benefit from skilled  therapeutic intervention in order to improve the following deficits and impairments:  Decreased balance, Decreased endurance, Decreased mobility, Decreased range of motion, Impaired tone, Decreased activity tolerance, Decreased coordination, Decreased strength, Impaired UE functional use, Improper spinal/pelvic alignment, Difficulty walking  Visit Diagnosis: Hemiplegia and hemiparesis following cerebral infarction affecting right dominant side (HCC)  Muscle weakness (generalized)  Unsteadiness on feet  Abnormal posture  Other lack of coordination    Problem List Patient Active Problem List   Diagnosis Date Noted  . Anxiety 10/24/2018  . Numbness of left hand 10/19/2018  . Spastic hemiparesis affecting dominant side ([place]) 09/20/2018  . Primary osteoarthritis of both knees 07/04/2018  . Iron deficiency 04/23/2018  . Chronic urticaria 03/20/2018  . Atherosclerosis of aorta ([place]) 02/02/2018  . Osteoarthritis of left knee 01/16/2018  . Osteoarthritis of right knee 01/16/2018  . Sequela of cerebrovascular accident 01/16/2018  . Paroxysmal atrial fibrillation ([place]) 12/19/2017  . Chronic combined systolic and diastolic CHF (congestive heart failure) ([place]) 12/16/2017  . Benign essential HTN   . Supplemental oxygen dependent   . 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 Coronary artery disease 11/23/2017  . NSTEMI (non-ST elevated myocardial infarction) ([place]) 11/16/2017  . Former smoker 10/13/2017  . Left middle cerebral artery stroke ([place]) 08/28/2017  . Cerebrovascular accident (CVA) due to occlusion of left middle cerebral artery ([place])   . Right hemiplegia ([place])   . Dysphagia, post-stroke   . Hyperlipidemia LDL goal <70     Raymondo Band, OTR/L  11/21/2018, 11:41 AM  [facility] [address] [place], [place], [address] Phone: [phone]   Fax:  [phone]  Name: [name] MRN: [id number] Date of Birth: 05-Jan-1961

## 2018-11-22 ENCOUNTER — Encounter
Payer: BLUE CROSS/BLUE SHIELD | Attending: Physical Medicine & Rehabilitation | Admitting: Physical Medicine & Rehabilitation

## 2018-11-22 ENCOUNTER — Other Ambulatory Visit: Payer: Self-pay

## 2018-11-22 ENCOUNTER — Encounter: Payer: Self-pay | Admitting: Physical Medicine & Rehabilitation

## 2018-11-22 VITALS — BP 114/68 | HR 93 | Ht 62.0 in | Wt 126.2 lb

## 2018-11-22 DIAGNOSIS — R1310 Dysphagia, unspecified: Secondary | ICD-10-CM | POA: Diagnosis not present

## 2018-11-22 DIAGNOSIS — Z87891 Personal history of nicotine dependence: Secondary | ICD-10-CM | POA: Diagnosis not present

## 2018-11-22 DIAGNOSIS — K5900 Constipation, unspecified: Secondary | ICD-10-CM | POA: Insufficient documentation

## 2018-11-22 DIAGNOSIS — I69391 Dysphagia following cerebral infarction: Secondary | ICD-10-CM | POA: Insufficient documentation

## 2018-11-22 DIAGNOSIS — I69351 Hemiplegia and hemiparesis following cerebral infarction affecting right dominant side: Secondary | ICD-10-CM | POA: Diagnosis not present

## 2018-11-22 DIAGNOSIS — Z833 Family history of diabetes mellitus: Secondary | ICD-10-CM | POA: Diagnosis not present

## 2018-11-22 DIAGNOSIS — Z8249 Family history of ischemic heart disease and other diseases of the circulatory system: Secondary | ICD-10-CM | POA: Insufficient documentation

## 2018-11-22 DIAGNOSIS — Z888 Allergy status to other drugs, medicaments and biological substances status: Secondary | ICD-10-CM | POA: Diagnosis not present

## 2018-11-22 DIAGNOSIS — M79642 Pain in left hand: Secondary | ICD-10-CM | POA: Diagnosis not present

## 2018-11-22 DIAGNOSIS — R200 Anesthesia of skin: Secondary | ICD-10-CM

## 2018-11-22 NOTE — Progress Notes (Signed)
Please see media tab for full results 

## 2018-11-23 ENCOUNTER — Other Ambulatory Visit: Payer: Self-pay

## 2018-11-23 DIAGNOSIS — I6523 Occlusion and stenosis of bilateral carotid arteries: Secondary | ICD-10-CM

## 2018-11-26 ENCOUNTER — Ambulatory Visit: Payer: BLUE CROSS/BLUE SHIELD | Admitting: Physical Therapy

## 2018-11-27 ENCOUNTER — Ambulatory Visit (HOSPITAL_COMMUNITY)
Admission: RE | Admit: 2018-11-27 | Discharge: 2018-11-27 | Disposition: A | Discharge status: Home / Self Care | Payer: BLUE CROSS/BLUE SHIELD | Source: Ambulatory Visit | Attending: Surgery | Admitting: Surgery

## 2018-11-27 ENCOUNTER — Ambulatory Visit (INDEPENDENT_AMBULATORY_CARE_PROVIDER_SITE_OTHER): Payer: BLUE CROSS/BLUE SHIELD | Admitting: *Deleted

## 2018-11-27 ENCOUNTER — Encounter: Payer: Self-pay | Admitting: Vascular Surgery

## 2018-11-27 ENCOUNTER — Ambulatory Visit: Payer: BLUE CROSS/BLUE SHIELD | Admitting: Vascular Surgery

## 2018-11-27 ENCOUNTER — Other Ambulatory Visit: Payer: Self-pay

## 2018-11-27 VITALS — BP 103/58 | HR 60 | Temp 97.5°F | Resp 20 | Ht 62.0 in | Wt 123.0 lb

## 2018-11-27 DIAGNOSIS — I6523 Occlusion and stenosis of bilateral carotid arteries: Secondary | ICD-10-CM | POA: Insufficient documentation

## 2018-11-27 DIAGNOSIS — I63512 Cerebral infarction due to unspecified occlusion or stenosis of left middle cerebral artery: Secondary | ICD-10-CM | POA: Diagnosis not present

## 2018-11-27 NOTE — Progress Notes (Signed)
[facility] [place]  Patient name: [name] MRN: [id number] DOB: [birth year] Sex: female  REASON FOR VISIT: Follow-up known carotid disease  HPI: [name] is a 58 y.o. female here today for follow-up.  She is here with her husband.  She has remained stable and has had no new neurologic deficit.  She has an extensive past history to include coronary artery bypass grafting in February 2019.  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  She continues to make progress with rehabilitation and has had no new neurologic deficit.  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 Arthritis   . CAD (coronary artery disease)    a. s/p NSTEMI in 10/2017 with cath showing LM disease --> s/p CABG on 11/23/2017 with LIMA-LAD and SVG-OM.   [name] Hay fever   . Ischemic cardiomyopathy    a. EF 35-40% by echo in 10/2017  . NSTEMI (non-ST elevated myocardial infarction) ([place])   . PAF (paroxysmal atrial fibrillation) ([place])    Diagnosed by ILR  . Pneumonia 11/16/2017  . Stroke [facility]) 08/23/2017   Left MCA infarct status post TPA and thrombectomy    Family History  Problem Relation Age of Onset  . Heart attack Father   . Hypertension Mother   . Hyperlipidemia Mother   . Diabetes type II Mother   . Hypertension Brother   . Breast cancer Cousin 28    SOCIAL HISTORY: Social History   Tobacco Use  . Smoking status: Former Smoker    Packs/day: 0.12    Years: 37.00    Pack years: 4.44    Types: Cigarettes    Last attempt to quit: 08/21/2017    Years since quitting: 1.2  . Smokeless tobacco: Never Used  Substance Use Topics  . Alcohol use: No    Frequency: Never    Allergies  Allergen Reactions  . Lopressor [Metoprolol Tartrate] Swelling    Angioedema  . Nsaids     On Xarelto  . Lipitor  [Atorvastatin] Diarrhea    Current Outpatient Medications  Medication Sig Dispense Refill  . baclofen (LIORESAL) 10 MG tablet Take 1 tablet (10 mg total) by mouth 3 (three) times daily. 90 each 1  . bisacodyl (DULCOLAX) 10 MG suppository Place 1 suppository (10 mg total) rectally as needed for moderate constipation. 12 suppository 0  . calcium carbonate (TUMS - DOSED IN MG ELEMENTAL CALCIUM) 500 MG chewable tablet Chew 1 tablet by mouth daily.    . cholecalciferol (VITAMIN D) 1000 units tablet Take 1,000 Units by mouth daily.    . diclofenac sodium (VOLTAREN) 1 % GEL Apply 2 g topically 4 (four) times daily. 1 Tube 1  . escitalopram (LEXAPRO) 10 MG tablet Take 1 tablet (10 mg total) by mouth daily. 90 tablet 1  . famotidine (PEPCID) 20 MG tablet Take 20 mg by mouth 2 (two) times daily.    . ferrous sulfate 325 (65 FE) MG tablet Take 325 mg by mouth daily with breakfast.    . furosemide (LASIX) 20 MG tablet Take 0.5 tablets (10 mg total) by mouth daily. 45 tablet 1  . levocetirizine (XYZAL) 5 MG tablet TAKE 1 TABLET BY MOUTH ONCE DAILY IN THE EVENING 90 tablet 0  . Potassium Chloride ER 20 MEQ TBCR Take 20 mEq by mouth daily. 90 tablet 1  .  rosuvastatin (CRESTOR) 40 MG tablet Take 1 tablet (40 mg total) by mouth daily. 90 tablet 3  . sacubitril-valsartan (ENTRESTO) 24-26 MG Take 1 tablet by mouth 2 (two) times daily. 60 tablet 11  . triamcinolone cream (KENALOG) 0.1 % Apply 1 application topically 2 (two) times daily. 30 g 2  . vitamin B-12 (CYANOCOBALAMIN) 100 MCG tablet Take 100 mcg by mouth daily.    Alveda Reasons 20 MG TABS tablet TAKE 1 TABLET BY MOUTH ONCE DAILY WITH SUPPER 90 tablet 1  . carvedilol (COREG) 3.125 MG tablet Take 1 tablet (3.125 mg total) by mouth 2 (two) times daily. 180 tablet 3   No current facility-administered medications for this visit.     REVIEW OF SYSTEMS:  [X]  denotes positive finding, [ ]  denotes negative finding Cardiac  Comments:  Chest pain or chest  pressure:    Shortness of breath upon exertion:    Short of breath when lying flat:    Irregular heart rhythm:        Vascular    Pain in calf, thigh, or hip brought on by ambulation:    Pain in feet at night that wakes you up from your sleep:     Blood clot in your veins:    Leg swelling:           PHYSICAL EXAM: Vitals:   11/27/18 1152 11/27/18 1156  BP: 107/65 (!) 103/58  Pulse: 60   Resp: 20   Temp: (!) 97.5 F (36.4 C)   SpO2: 93%   Weight: 123 lb (55.8 kg)   Height: 5\' 2"  (1.575 m)     GENERAL: The patient is a well-nourished female, in no acute distress. The vital signs are documented above. CARDIOVASCULAR: Carotid arteries are without bruits bilaterally.  2+ radial pulses bilaterally PULMONARY: There is good air exchange  MUSCULOSKELETAL: There are no major deformities or cyanosis. NEUROLOGIC: Weakness in her right and left leg.  Speech is normal SKIN: There are no ulcers or rashes noted. PSYCHIATRIC: The patient has a normal affect.  DATA:  Carotid duplex today reveals no change in her left 40 to 59% stenosis and a lesser prediction of degree in stenosis on the right carotid in the 1 to 39% range  MEDICAL ISSUES: Stable carotid disease.  Recommend repeat carotid duplex in 1 year.    Rosetta Posner, MD FACS [facility] Tel [phone] Pager [phone]

## 2018-11-28 LAB — CUP PACEART REMOTE DEVICE CHECK
Date Time Interrogation Session: 20200304003700
Implantable Pulse Generator Implant Date: 20181203

## 2018-12-05 ENCOUNTER — Ambulatory Visit: Payer: BLUE CROSS/BLUE SHIELD | Admitting: Occupational Therapy

## 2018-12-05 ENCOUNTER — Encounter: Payer: Self-pay | Admitting: Occupational Therapy

## 2018-12-05 ENCOUNTER — Ambulatory Visit: Payer: BLUE CROSS/BLUE SHIELD | Attending: Physical Medicine & Rehabilitation | Admitting: Physical Therapy

## 2018-12-05 ENCOUNTER — Encounter: Payer: Self-pay | Admitting: Physical Therapy

## 2018-12-05 ENCOUNTER — Other Ambulatory Visit: Payer: Self-pay

## 2018-12-05 DIAGNOSIS — R278 Other lack of coordination: Secondary | ICD-10-CM | POA: Diagnosis not present

## 2018-12-05 DIAGNOSIS — M25561 Pain in right knee: Secondary | ICD-10-CM | POA: Diagnosis not present

## 2018-12-05 DIAGNOSIS — M6281 Muscle weakness (generalized): Secondary | ICD-10-CM | POA: Insufficient documentation

## 2018-12-05 DIAGNOSIS — R293 Abnormal posture: Secondary | ICD-10-CM

## 2018-12-05 DIAGNOSIS — I69351 Hemiplegia and hemiparesis following cerebral infarction affecting right dominant side: Secondary | ICD-10-CM | POA: Diagnosis not present

## 2018-12-05 DIAGNOSIS — R2689 Other abnormalities of gait and mobility: Secondary | ICD-10-CM | POA: Diagnosis not present

## 2018-12-05 DIAGNOSIS — M25562 Pain in left knee: Secondary | ICD-10-CM | POA: Insufficient documentation

## 2018-12-05 DIAGNOSIS — G8929 Other chronic pain: Secondary | ICD-10-CM | POA: Diagnosis not present

## 2018-12-05 DIAGNOSIS — R2681 Unsteadiness on feet: Secondary | ICD-10-CM | POA: Insufficient documentation

## 2018-12-05 NOTE — Therapy (Signed)
[facility] [address] [place], [place], [address] Phone: [phone]   Fax:  [phone]  Occupational Therapy Treatment  Patient Details  Name: [name] MRN: [id number] Date of Birth: September 10, 1961 Referring Provider (OT): Jamse Arn   Encounter Date: 12/05/2018  OT End of Session - 12/05/18 1026    Visit Number  2    Number of Visits  8    Date for OT Re-Evaluation  12/19/18    Authorization Type  BCBS - pt has 30 VL combined with PT    OT Start Time  0935    OT Stop Time  1016    OT Time Calculation (min)  41 min    Activity Tolerance  Patient tolerated treatment well    Behavior During Therapy  [facility] for tasks assessed/performed       Past Medical History:  Diagnosis Date  . Arthritis   . CAD (coronary artery disease)    a. s/p NSTEMI in 10/2017 with cath showing LM disease --> s/p CABG on 11/23/2017 with LIMA-LAD and SVG-OM.   [name] Hay fever   . Ischemic cardiomyopathy    a. EF 35-40% by echo in 10/2017  . NSTEMI (non-ST elevated myocardial infarction) ([place])   . PAF (paroxysmal atrial fibrillation) ([place])    Diagnosed by ILR  . Pneumonia 11/16/2017  . Stroke [facility]) 08/23/2017   Left MCA infarct status post TPA and thrombectomy    Past Surgical History:  Procedure Laterality Date  . CORONARY ARTERY BYPASS GRAFT N/A 11/23/2017   Procedure: CORONARY ARTERY BYPASS GRAFTING (CABG) x 2, ON PUMP, USING LEFT INTERNAL MAMMARY ARTERY AND RIGHT GREATER SAPHENOUS VEIN HARVESTED ENDOSCOPICALLY;  Surgeon: Gaye Pollack, MD;  Location: [facility];  Service: Open Heart Surgery;  Laterality: N/A;  . CRYOABLATION     cervical  . IR ANGIO INTRA EXTRACRAN SEL COM CAROTID INNOMINATE UNI R MOD SED  08/21/2017  . IR ANGIO VERTEBRAL SEL SUBCLAVIAN INNOMINATE UNI R MOD SED  08/21/2017  . IR PERCUTANEOUS ART THROMBECTOMY/INFUSION INTRACRANIAL INC DIAG ANGIO  08/21/2017  . IR RADIOLOGIST EVAL & MGMT  10/30/2017  . LOOP  RECORDER INSERTION N/A 08/28/2017   Procedure: LOOP RECORDER INSERTION;  Surgeon: Constance Haw, MD;  Location: [facility];  Service: Cardiovascular;  Laterality: N/A;  . RADIOLOGY WITH ANESTHESIA N/A 08/21/2017   Procedure: RADIOLOGY WITH ANESTHESIA;  Surgeon: Luanne Bras, MD;  Location: [facility];  Service: Radiology;  Laterality: N/A;  . RIGHT/LEFT HEART CATH AND CORONARY ANGIOGRAPHY N/A 11/20/2017   Procedure: RIGHT/LEFT HEART CATH AND CORONARY ANGIOGRAPHY;  Surgeon: Jettie Booze, MD;  Location: [facility];  Service: Cardiovascular;  Laterality: N/A;  . TEE WITHOUT CARDIOVERSION N/A 08/28/2017   Procedure: TRANSESOPHAGEAL ECHOCARDIOGRAM (TEE);  Surgeon: Fay Records, MD;  Location: [facility];  Service: Cardiovascular;  Laterality: N/A;  . TEE WITHOUT CARDIOVERSION N/A 11/23/2017   Procedure: TRANSESOPHAGEAL ECHOCARDIOGRAM (TEE);  Surgeon: Gaye Pollack, MD;  Location: [facility];  Service: Open Heart Surgery;  Laterality: N/A;  . TUBAL LIGATION      There were no vitals filed for this visit.  Subjective Assessment - 12/05/18 0936    Subjective   Pt reports her left hand has been starting to feel numb when gripping items for a long time.     Patient is accompanied by:  Family member   husband   Pertinent History  L MCA CVA in 07/2017 s/p TPA, thrombectomy; PMHx:  loop recorder, CAD, CABG 10/2017, MI, bil knee OA    Patient Stated Goals  wants to walk better without AD; wants to be able to use R arm better, to write; less pain in L UE     Currently in Pain?  Yes    Pain Score  6    L knee   Pain Location  Knee   L>R   Pain Orientation  Left;Right    Pain Type  Chronic pain    Pain Onset  More than a month ago    Aggravating Factors   using walker     Pain Relieving Factors  rest     Multiple Pain Sites  No         Reviewed POC/goals with pt with pt in agreement; copy of goals provided.   In supine pt engaged in PROM and self-ROM for RUE shoulder  flexion and scapular retraction; use of closed chain activity using medium size ball and pcp frame for scapular activation, chest press and shoulder flexion with mod assist required to maintain RUE stability/alignment. Pt with increased difficulty maintaining shoulder/scapular depression requiring mod facilitation to perform.   Transitioned to sitting; R hand resting on small wedge to improve postural alignment, requires passive stretching of R wrist to increase ease of R hand extension prior to seated activitity. Focus on trunk NMR incorporating LUE reach within PNF and horizontal reach patterns with modA to facilitate movement patterns and truncal rotation. Seated scapular retraction with bil UEs supported on mat, pt noted able to activate scapular movement with mod cues for positioning.   Pt reporting increased numbness in L hand during session, suspect partly due to overuse. Wrapped handle of pt's cane with coban to increase cushion/support to L hand with use during functional mobility.                   OT Education - 12/05/18 1030    Education Details  reviewed POC and goals with pt, pt in agreement; provided pt with copy of treatment goals    Person(s) Educated  Patient    Methods  Explanation;Handout    Comprehension  Verbalized understanding          OT Long Term Goals - 11/21/18 1043      OT LONG TERM GOAL #1   Title  Pt will be independent in updated HEP for RUE - 12/19/18     Status  New      OT LONG TERM GOAL #2   Title  Pt will self-report able to complete morning ADL routine in 45 min or less.     Baseline  currently reports takes approx 1hr to perform     Status  New      OT LONG TERM GOAL #3   Title  Pt will demonstrate ability to perform simple cold meal prep at mod independent.     Status  New      OT LONG TERM GOAL #4   Title  Pt will demonstrate ability to use RUE as gross assist 25% of the time within basic self care.     Status  New      OT  LONG TERM GOAL #5   Title  Pt will demonstrate ability for bil low reach with 50* shoulder flexion during functional activities.     Status  New      Long Term Additional Goals   Additional Long Term Goals  Yes  OT LONG TERM GOAL #6   Title  Pt will demonstrate increased grip strength of 3lb or more in RUE to assist with bil reaching task.     Baseline  8lb     Status  New      OT LONG TERM GOAL #7   Title  Pt will verbalize understanding for RUE positionig in bed in preparation for returning to sleeping in bed vs recliner.     Status  New            Plan - 12/05/18 1026    Clinical Impression Statement  58 y/o female with L MCA CVA in 07/2017 who now presents with decreased balance, abnormal postural alignment, decreased functional use of RUE and RUE deficits including mild hypertonicity, decreased A/PROM, decreased UE strength and weak grip strength all of which impact pt's ability to perform meaningful functional activities. PTA pt was completing ADL, iADL and functional ambulation independently. Pt will benefit from skilled clinical intervention to address current deficits and to maximize her overall functional independence.     Occupational Profile and client history currently impacting functional performance  wife, homemaker, enjoys flowers/gardening  PMH: L MCA CVA (07/2017) s/p TPA, thrombectomy, loop recorder, CAD, CABG, cardiac debility, MI, tobacco use, HTN, bil knee OA    Occupational performance deficits (Please refer to evaluation for details):  ADL's;Leisure;IADL's;Rest and Sleep;Social Participation    Rehab Potential  Good    OT Frequency  2x / week    OT Duration  4 weeks    OT Treatment/Interventions  Self-care/ADL training;Therapeutic exercise;Balance training;Fluidtherapy;Manual Therapy;Neuromuscular education;Patient/family education;Therapeutic activities;Passive range of motion;Energy conservation;DME and/or AE instruction;Functional Mobility  Training;Splinting;Aquatic Therapy;Moist Heat    Plan  continue to address NMR for RUE/trunk, postural alignment as precursor for using RUE during functional tasks; provide RUE HEP if appropriate     Consulted and Agree with Plan of Care  Patient;Family member/caregiver    Family Member Consulted  husband       Patient will benefit from skilled therapeutic intervention in order to improve the following deficits and impairments:     Visit Diagnosis: Hemiplegia and hemiparesis following cerebral infarction affecting right dominant side (HCC)  Muscle weakness (generalized)  Abnormal posture  Other lack of coordination  Unsteadiness on feet    Problem List Patient Active Problem List   Diagnosis Date Noted  . Anxiety 10/24/2018  . Numbness of left hand 10/19/2018  . Spastic hemiparesis affecting dominant side ([facility]) 09/20/2018  . Primary osteoarthritis of both knees 07/04/2018  . Iron deficiency 04/23/2018  . Chronic urticaria 03/20/2018  . Atherosclerosis of aorta ([place]) 02/02/2018  . Osteoarthritis of left knee 01/16/2018  . Osteoarthritis of right knee 01/16/2018  . Sequela of cerebrovascular accident 01/16/2018  . Paroxysmal atrial fibrillation ([place]) 12/19/2017  . Chronic combined systolic and diastolic CHF (congestive heart failure) ([place]) 12/16/2017  . Benign essential HTN   . Supplemental oxygen dependent   . 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 Coronary artery disease 11/23/2017  . NSTEMI (non-ST elevated myocardial infarction) ([place]) 11/16/2017  . Former smoker 10/13/2017  . Left middle cerebral artery stroke ([place]) 08/28/2017  . Cerebrovascular accident (CVA) due to occlusion of left middle cerebral artery ([place])   . Right hemiplegia ([place])   . Dysphagia, post-stroke   . Hyperlipidemia LDL goal <70     Raymondo Band, OTR/L  12/05/2018, 10:55 AM  [facility] [address] [place], [place], [address] Phone:  [phone]  Fax:  [phone]  Name: AYDA TANCREDI MRN: [id number] Date of Birth: November 08, 1960

## 2018-12-05 NOTE — Progress Notes (Signed)
[facility] Summary Report / Loop Recorder 

## 2018-12-05 NOTE — Therapy (Signed)
[facility] [address] [place], [place], [address] Phone: [phone]   Fax:  [phone]  Physical Therapy Evaluation  Patient Details  Name: [name] MRN: [id number] Date of Birth: 12/21/1960 Referring Provider (PT): Patel, Ankit Anil, MD   Encounter Date: 12/05/2018  PT End of Session - 12/05/18 1117    Visit Number  1    Number of Visits  8    Authorization Type  BCBS (Ded & Oop have been met. Pt has no financial responsibility).    PT Start Time  1017    PT Stop Time  1102    PT Time Calculation (min)  45 min    Equipment Utilized During Treatment  Gait belt    Activity Tolerance  Patient tolerated treatment well;No increased pain    Behavior During Therapy  WFL for tasks assessed/performed       Past Medical History:  Diagnosis Date  . Arthritis   . CAD (coronary artery disease)    a. s/p NSTEMI in 10/2017 with cath showing LM disease --> s/p CABG on 11/23/2017 with LIMA-LAD and SVG-OM.   . Hay fever   . Ischemic cardiomyopathy    a. EF 35-40% by echo in 10/2017  . NSTEMI (non-ST elevated myocardial infarction) (H[facility])   . PAF (paroxysmal atrial fibrillation) (H[facility])    Diagnosed by ILR  . Pneumonia 11/16/2017  . Stroke (HCC) 08/23/2017   Left MCA infarct status post TPA and thrombectomy    Past Surgical History:  Procedure Laterality Date  . CORONARY ARTERY BYPASS GRAFT N/A 11/23/2017   Procedure: CORONARY ARTERY BYPASS GRAFTING (CABG) x 2, ON PUMP, USING LEFT INTERNAL MAMMARY ARTERY AND RIGHT GREATER SAPHENOUS VEIN HARVESTED ENDOSCOPICALLY;  Surgeon: Bartle, Bryan K, MD;  [facility];  Service: Open Heart Surgery;  Laterality: N/A;  . CRYOABLATION     cervical  . IR ANGIO INTRA EXTRACRAN SEL COM CAROTID INNOMINATE UNI R MOD SED  08/21/2017  . IR ANGIO VERTEBRAL SEL SUBCLAVIAN INNOMINATE UNI R MOD SED  08/21/2017  . IR PERCUTANEOUS ART THROMBECTOMY/INFUSION INTRACRANIAL INC DIAG ANGIO   08/21/2017  . IR RADIOLOGIST EVAL & MGMT  10/30/2017  . LOOP RECORDER INSERTION N/A 08/28/2017   Procedure: LOOP RECORDER INSERTION;  Surgeon: Camnitz, Will Martin, MD;  [facility];  Service: Cardiovascular;  Laterality: N/A;  . RADIOLOGY WITH ANESTHESIA N/A 08/21/2017   Procedure: RADIOLOGY WITH ANESTHESIA;  Surgeon: Deveshwar, Sanjeev, MD;  [facility];  Service: Radiology;  Laterality: N/A;  . RIGHT/LEFT HEART CATH AND CORONARY ANGIOGRAPHY N/A 11/20/2017   Procedure: RIGHT/LEFT HEART CATH AND CORONARY ANGIOGRAPHY;  Surgeon: Varanasi, Jayadeep S, MD;  [facility];  Service: Cardiovascular;  Laterality: N/A;  . TEE WITHOUT CARDIOVERSION N/A 08/28/2017   Procedure: TRANSESOPHAGEAL ECHOCARDIOGRAM (TEE);  Surgeon: Ross, Paula V, MD;  [facility];  Service: Cardiovascular;  Laterality: N/A;  . TEE WITHOUT CARDIOVERSION N/A 11/23/2017   Procedure: TRANSESOPHAGEAL ECHOCARDIOGRAM (TEE);  Surgeon: Bartle, Bryan K, MD;  [facility];  Service: Open Heart Surgery;  Laterality: N/A;  . TUBAL LIGATION      There were no vitals filed for this visit.   Subjective Assessment - 12/05/18 1024    Subjective  This 58 yo female was referred for PT evaluation on 11/07/2018 with Left MCA CVA, osteoarthritis, spastic hemiplegia & abnormal gait. Pt reports no shortness or breath or chest pain with activity following CABG in 2019. Pt complains of   elevated myocardial infarction) (HCC) 11/16/2017  . Former smoker 10/13/2017  . 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 Cerebrovascular accident (CVA) due to occlusion of left middle cerebral artery (HCC)   . Right hemiplegia (HCC)   . Dysphagia, post-stroke   . Hyperlipidemia LDL goal <70    Airik Goodlin, SPT 12/05/2018, 12:36 PM  Robin Waldron, PT, DPT 12/06/2018, 10:11 AM  [facility] [address] [place], [place], [address] Phone: [phone]   Fax:   [phone]  Name: [name] MRN: [id number] Date of Birth: 01/29/1961    elevated myocardial infarction) ([place]) 11/16/2017  . Former smoker 10/13/2017  . Left middle cerebral artery stroke ([facility]) 08/28/2017  . Cerebrovascular accident (CVA) due to occlusion of left middle cerebral artery ([place])   . Right hemiplegia ([place])   . Dysphagia, post-stroke   . Hyperlipidemia LDL goal <70    Joaquin Courts, SPT 12/05/2018, 12:36 PM  Jamey Reas, PT, DPT 12/06/2018, 10:11 AM  [facility] [address] [place], [place], [address] Phone: [phone]   Fax:   [phone]  Name: [name] MRN: [id number] Date of Birth: 04-10-1961  [facility] [address] [place], [place], [address] Phone: [phone]   Fax:  [phone]  Physical Therapy Evaluation  Patient Details  Name: [name] MRN: [id number] Date of Birth: 12/21/1960 Referring Provider (PT): Patel, Ankit Anil, MD   Encounter Date: 12/05/2018  PT End of Session - 12/05/18 1117    Visit Number  1    Number of Visits  8    Authorization Type  BCBS (Ded & Oop have been met. Pt has no financial responsibility).    PT Start Time  1017    PT Stop Time  1102    PT Time Calculation (min)  45 min    Equipment Utilized During Treatment  Gait belt    Activity Tolerance  Patient tolerated treatment well;No increased pain    Behavior During Therapy  WFL for tasks assessed/performed       Past Medical History:  Diagnosis Date  . Arthritis   . CAD (coronary artery disease)    a. s/p NSTEMI in 10/2017 with cath showing LM disease --> s/p CABG on 11/23/2017 with LIMA-LAD and SVG-OM.   . Hay fever   . Ischemic cardiomyopathy    a. EF 35-40% by echo in 10/2017  . NSTEMI (non-ST elevated myocardial infarction) (H[facility])   . PAF (paroxysmal atrial fibrillation) ([facility])    Diagnosed by ILR  . Pneumonia 11/16/2017  . Stroke (HCC) 08/23/2017   Left MCA infarct status post TPA and thrombectomy    Past Surgical History:  Procedure Laterality Date  . CORONARY ARTERY BYPASS GRAFT N/A 11/23/2017   Procedure: CORONARY ARTERY BYPASS GRAFTING (CABG) x 2, ON PUMP, USING LEFT INTERNAL MAMMARY ARTERY AND RIGHT GREATER SAPHENOUS VEIN HARVESTED ENDOSCOPICALLY;  Surgeon: Bartle, Bryan K, MD;  [facility];  Service: Open Heart Surgery;  Laterality: N/A;  . CRYOABLATION     cervical  . IR ANGIO INTRA EXTRACRAN SEL COM CAROTID INNOMINATE UNI R MOD SED  08/21/2017  . IR ANGIO VERTEBRAL SEL SUBCLAVIAN INNOMINATE UNI R MOD SED  08/21/2017  . IR PERCUTANEOUS ART THROMBECTOMY/INFUSION INTRACRANIAL INC DIAG ANGIO   08/21/2017  . IR RADIOLOGIST EVAL & MGMT  10/30/2017  . LOOP RECORDER INSERTION N/A 08/28/2017   Procedure: LOOP RECORDER INSERTION;  Surgeon: Camnitz, Will Martin, MD;  [facility];  Service: Cardiovascular;  Laterality: N/A;  . RADIOLOGY WITH ANESTHESIA N/A 08/21/2017   Procedure: RADIOLOGY WITH ANESTHESIA;  Surgeon: Deveshwar, Sanjeev, MD;  [facility];  Service: Radiology;  Laterality: N/A;  . RIGHT/LEFT HEART CATH AND CORONARY ANGIOGRAPHY N/A 11/20/2017   Procedure: RIGHT/LEFT HEART CATH AND CORONARY ANGIOGRAPHY;  Surgeon: Varanasi, Jayadeep S, MD;  [facility];  Service: Cardiovascular;  Laterality: N/A;  . TEE WITHOUT CARDIOVERSION N/A 08/28/2017   Procedure: TRANSESOPHAGEAL ECHOCARDIOGRAM (TEE);  Surgeon: Ross, Paula V, MD;  [facility];  Service: Cardiovascular;  Laterality: N/A;  . TEE WITHOUT CARDIOVERSION N/A 11/23/2017   Procedure: TRANSESOPHAGEAL ECHOCARDIOGRAM (TEE);  Surgeon: Bartle, Bryan K, MD;  [facility];  Service: Open Heart Surgery;  Laterality: N/A;  . TUBAL LIGATION      There were no vitals filed for this visit.   Subjective Assessment - 12/05/18 1024    Subjective  This 58 yo female was referred for PT evaluation on 11/07/2018 with Left MCA CVA, osteoarthritis, spastic hemiplegia & abnormal gait. Pt reports no shortness or breath or chest pain with activity following CABG in 2019. Pt complains of   elevated myocardial infarction) ([place]) 11/16/2017  . Former smoker 10/13/2017  . Left middle cerebral artery stroke ([place]) 08/28/2017  . Cerebrovascular accident (CVA) due to occlusion of left middle cerebral artery ([place])   . Right hemiplegia ([place])   . Dysphagia, post-stroke   . Hyperlipidemia LDL goal <70    Joaquin Courts, SPT 12/05/2018, 12:36 PM  Jamey Reas, PT, DPT 12/06/2018, 10:11 AM  [facility] [address] [place], [place], [address] Phone: [phone]   Fax:   [phone]  Name: [name] MRN: [id number] Date of Birth: 04-10-1961  elevated myocardial infarction) (HCC) 11/16/2017  . Former smoker 10/13/2017  . 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 Cerebrovascular accident (CVA) due to occlusion of left middle cerebral artery (HCC)   . Right hemiplegia (HCC)   . Dysphagia, post-stroke   . Hyperlipidemia LDL goal <70     , SPT 12/05/2018, 12:36 PM  Robin Waldron, PT, DPT 12/06/2018, 10:11 AM  [facility] [address] [place], [place], [address] Phone: [phone]   Fax:   [phone]  Name: [name] MRN: [id number] Date of Birth: 01/29/1961  

## 2018-12-07 ENCOUNTER — Ambulatory Visit: Payer: BLUE CROSS/BLUE SHIELD | Admitting: Occupational Therapy

## 2018-12-07 ENCOUNTER — Ambulatory Visit: Payer: BLUE CROSS/BLUE SHIELD | Admitting: Physical Therapy

## 2018-12-07 ENCOUNTER — Encounter: Payer: Self-pay | Admitting: Physical Therapy

## 2018-12-07 DIAGNOSIS — R278 Other lack of coordination: Secondary | ICD-10-CM

## 2018-12-07 DIAGNOSIS — M6281 Muscle weakness (generalized): Secondary | ICD-10-CM

## 2018-12-07 DIAGNOSIS — I69351 Hemiplegia and hemiparesis following cerebral infarction affecting right dominant side: Secondary | ICD-10-CM | POA: Diagnosis not present

## 2018-12-07 DIAGNOSIS — R2689 Other abnormalities of gait and mobility: Secondary | ICD-10-CM | POA: Diagnosis not present

## 2018-12-07 DIAGNOSIS — M25561 Pain in right knee: Secondary | ICD-10-CM | POA: Diagnosis not present

## 2018-12-07 DIAGNOSIS — G8929 Other chronic pain: Secondary | ICD-10-CM | POA: Diagnosis not present

## 2018-12-07 DIAGNOSIS — R2681 Unsteadiness on feet: Secondary | ICD-10-CM | POA: Diagnosis not present

## 2018-12-07 DIAGNOSIS — M25562 Pain in left knee: Secondary | ICD-10-CM | POA: Diagnosis not present

## 2018-12-07 DIAGNOSIS — R293 Abnormal posture: Secondary | ICD-10-CM | POA: Diagnosis not present

## 2018-12-07 NOTE — Patient Instructions (Signed)
Access Code: [email or web address]  URL: [email or web address]  Date: 12/07/2018  Prepared by: Willow Ora   Exercises  Seated Hamstring Stretch - 3 reps - 1 sets - 30 hold - 1x daily - 5x weekly  Sit to Stand - 10 reps - 1 sets - 1x daily - 5x weekly  Seated Hip Abduction with Resistance - 10 reps - 1 sets - 5 hold - 1x daily - 5x weekly  Sitting Knee Extension with Resistance - 10 reps - 1 sets - 1x daily - 5x weekly  Seated Hamstring Curl with Anchored Resistance - 10 reps - 1 sets - 1x daily - 5x weekly

## 2018-12-07 NOTE — Therapy (Signed)
[facility] [address] [place], [place], [address] Phone: [phone]   Fax:  [phone]  Physical Therapy Treatment  Patient Details  Name: [name] MRN: [id number] Date of Birth: 04-Apr-1961 Referring Provider (PT): Jamse Arn, MD   Encounter Date: 12/07/2018  PT End of Session - 12/07/18 1004    Visit Number  2    Number of Visits  8    Authorization Type  BCBS (Ded & Oop have been met. Pt has no financial responsibility). 30 visit limit for PT/OT combined.    PT Start Time  [phone]    PT Stop Time  1015    PT Time Calculation (min)  41 min    Equipment Utilized During Treatment  Gait belt    Activity Tolerance  Patient tolerated treatment well;No increased pain    Behavior During Therapy  WFL for tasks assessed/performed       Past Medical History:  Diagnosis Date  . Arthritis   . CAD (coronary artery disease)    a. s/p NSTEMI in 10/2017 with cath showing LM disease --> s/p CABG on 11/23/2017 with LIMA-LAD and SVG-OM.   [name] Hay fever   . Ischemic cardiomyopathy    a. EF 35-40% by echo in 10/2017  . NSTEMI (non-ST elevated myocardial infarction) ([place])   . PAF (paroxysmal atrial fibrillation) ([place])    Diagnosed by ILR  . Pneumonia 11/16/2017  . Stroke [facility]) 08/23/2017   Left MCA infarct status post TPA and thrombectomy    Past Surgical History:  Procedure Laterality Date  . CORONARY ARTERY BYPASS GRAFT N/A 11/23/2017   Procedure: CORONARY ARTERY BYPASS GRAFTING (CABG) x 2, ON PUMP, USING LEFT INTERNAL MAMMARY ARTERY AND RIGHT GREATER SAPHENOUS VEIN HARVESTED ENDOSCOPICALLY;  Surgeon: Gaye Pollack, MD;  Location: [facility];  Service: Open Heart Surgery;  Laterality: N/A;  . CRYOABLATION     cervical  . IR ANGIO INTRA EXTRACRAN SEL COM CAROTID INNOMINATE UNI R MOD SED  08/21/2017  . IR ANGIO VERTEBRAL SEL SUBCLAVIAN INNOMINATE UNI R MOD SED  08/21/2017  . IR PERCUTANEOUS ART THROMBECTOMY/INFUSION  INTRACRANIAL INC DIAG ANGIO  08/21/2017  . IR RADIOLOGIST EVAL & MGMT  10/30/2017  . LOOP RECORDER INSERTION N/A 08/28/2017   Procedure: LOOP RECORDER INSERTION;  Surgeon: Constance Haw, MD;  Location: [facility] CV [facility];  Service: Cardiovascular;  Laterality: N/A;  . RADIOLOGY WITH ANESTHESIA N/A 08/21/2017   Procedure: RADIOLOGY WITH ANESTHESIA;  Surgeon: Luanne Bras, MD;  Location: [facility];  Service: Radiology;  Laterality: N/A;  . RIGHT/LEFT HEART CATH AND CORONARY ANGIOGRAPHY N/A 11/20/2017   Procedure: RIGHT/LEFT HEART CATH AND CORONARY ANGIOGRAPHY;  Surgeon: Jettie Booze, MD;  Location: [facility];  Service: Cardiovascular;  Laterality: N/A;  . TEE WITHOUT CARDIOVERSION N/A 08/28/2017   Procedure: TRANSESOPHAGEAL ECHOCARDIOGRAM (TEE);  Surgeon: Fay Records, MD;  Location: [facility];  Service: Cardiovascular;  Laterality: N/A;  . TEE WITHOUT CARDIOVERSION N/A 11/23/2017   Procedure: TRANSESOPHAGEAL ECHOCARDIOGRAM (TEE);  Surgeon: Gaye Pollack, MD;  Location: [facility];  Service: Open Heart Surgery;  Laterality: N/A;  . TUBAL LIGATION      There were no vitals filed for this visit.  Subjective Assessment - 12/07/18 0939    Subjective  No new complaints. No falls since last session. Reports she was sore after last session.     Patient is accompained by:  Family member   spouse [name]   Limitations  Standing;Walking    Patient Stated Goals  walk without knee issues, decrease knee pain with activity, improve posture and body mechanics during gait; per husband - get back to dancing     Currently in Pain?  Yes    Pain Score  8    5= best, 8= worst last 24 hours   Pain Location  Knee    Pain Orientation  Right;Left    Pain Descriptors / Indicators  Aching    Pain Type  Chronic pain    Pain Onset  More than a month ago    Pain Frequency  Constant    Aggravating Factors   standing >10 minutes, walking, more stiff 1st thing in morning    Pain Relieving  Factors  sitting down, recliner, walking without overdoing it          [facility] Adult PT Treatment/Exercise - 12/07/18 1702      Transfers   Transfers  Sit to Stand;Stand to Sit;Independent with all Transfers    Sit to Stand  7: Independent    Stand to Sit  7: Independent      Ambulation/Gait   Ambulation/Gait  Yes    Ambulation/Gait Assistance  7: Independent;6: Modified independent (Device/Increase time)    Ambulation/Gait Assistance Details  pt noted to carry cane at times on indoor surfaces with no balance issues noted.     Ambulation Distance (Feet)  110 Feet   x2   Assistive device  Small based quad cane    Gait Pattern  Step-through pattern;Decreased arm swing - right;Decreased stride length;Decreased arm swing - left;Antalgic    Ambulation Surface  Level;Indoor        Issued the following to pt's HEP. Cues on correct ex form/technique provided. Access Code: [email or web address]  URL: [email or web address]  Date: 12/07/2018  Prepared by: Willow Ora   Exercises  Seated Hamstring Stretch - 3 reps - 1 sets - 30 hold - 1x daily - 5x weekly  Sit to Stand - 10 reps - 1 sets - 1x daily - 5x weekly  Seated Hip Abduction with Resistance - 10 reps - 1 sets - 5 hold - 1x daily - 5x weekly  Sitting Knee Extension with Resistance - 10 reps - 1 sets - 1x daily - 5x weekly  Seated Hamstring Curl with Anchored Resistance - 10 reps - 1 sets - 1x daily - 5x weekly        PT Education - 12/07/18 1009    Education Details  HEP for stretching and strengthening    Person(s) Educated  Patient;Spouse    Methods  Explanation;Demonstration;Verbal cues;Handout    Comprehension  Verbalized understanding;Returned demonstration;Verbal cues required;Need further instruction       PT Short Term Goals - 12/05/18 1144      PT SHORT TERM GOAL #1   Title  Pt will verbalize understanding of initial HEP.     Time  2    Period  Weeks    Status  New    Target Date  12/14/18      PT SHORT  TERM GOAL #2   Title  Pt is able to ambulate, carry on a conversation, and scan environment with no loss of balance     Time  4    Period  Weeks    Status  New    Target Date  12/14/18      PT SHORT TERM GOAL #3   Title  Patient verbalizes understanding for recommendations for assistive device for  safety & decrease stress for OA.     Time  2    Period  Weeks    Status  New    Target Date  12/14/18        PT Long Term Goals - 12/05/18 1148      PT LONG TERM GOAL #1   Title  Patient verbalizes & demonstrates of ongoing HEP & community fitness program.    Time  6    Period  Weeks    Status  New    Target Date  01/11/19      PT LONG TERM GOAL #2   Title  Improve difference between normal and cognitive TUG to be <10% to decrease falls risk    Baseline  24.1% on 3/11    Time  6    Period  Weeks    Status  New    Target Date  01/11/19      PT LONG TERM GOAL #3   Title  patient ambulates 1000' outdoors including grass modified Independent with LRAD and no loss of balance    Time  6    Period  Weeks    Status  New    Target Date  01/11/19      PT LONG TERM GOAL #4   Title  Improve gait velocity to >2.62 ft/sec with LRAD to improve community mobility     Baseline  2.23 ft/sec on 3/11     Time  6    Period  Weeks    Status  New    Target Date  01/11/19      PT LONG TERM GOAL #5   Title  Patient negotiates ramps, curbs & stairs with LRAD modified independent to enable community access.     Time  6    Period  Weeks    Status  New    Target Date  01/11/19      Additional Long Term Goals   Additional Long Term Goals  Yes      PT LONG TERM GOAL #6   Title  Patient verbalizes how to manage chronic OA knee pain including modifying activity level & assistive device.     Time  6    Period  Weeks    Status  New    Target Date  01/11/19            Plan - 12/07/18 1703    Clinical Impression Statement  Today's skilled session focused on establishment of an HEP to  address stretching and strengthening of LE's with no increase in pain reported.  The pt is progressing toward goals and should benefit from continued PT to progress toward unmet goals.    Personal Factors and Comorbidities  Comorbidity 3+;Time since onset of injury/illness/exacerbation    Comorbidities  CVA 08/23/2017, CAD, NSTEMI, A-Fib, 11/23/2017 CABGx2,     Examination-Activity Limitations  Squat;Stand;Locomotion Level    Examination-Participation Restrictions  Shop;Community Activity    Stability/Clinical Decision Making  Evolving/Moderate complexity    Rehab Potential  Good    PT Frequency  Other (comment)   2x/wk for 2 wks, 1/wk for 4 wks   PT Duration  6 weeks   see frequency   PT Treatment/Interventions  Gait training;Stair training;Functional mobility training;DME Instruction;Therapeutic exercise;Therapeutic activities;Balance training;Neuromuscular re-education;Patient/family education;ADLs/Self Care Home Management;Manual techniques;Moist Heat    PT Next Visit Plan  work towards [place], continue to work on strengthening and flexibilty of LE's as pt tolerates.     Consulted  and Agree with Plan of Care  Patient;Family member/caregiver    Family Member Consulted  husband - [name]       Patient will benefit from skilled therapeutic intervention in order to improve the following deficits and impairments:  Abnormal gait, Decreased range of motion, Impaired tone, Decreased balance, Decreased knowledge of use of DME, Impaired flexibility, Improper body mechanics, Decreased mobility, Postural dysfunction  Visit Diagnosis: Muscle weakness (generalized)  Hemiplegia and hemiparesis following cerebral infarction affecting right dominant side [facility])     Problem List Patient Active Problem List   Diagnosis Date Noted  . Anxiety 10/24/2018  . Numbness of left hand 10/19/2018  . Spastic hemiparesis affecting dominant side ([place]) 09/20/2018  . Primary osteoarthritis of both knees 07/04/2018  .  Iron deficiency 04/23/2018  . Chronic urticaria 03/20/2018  . Atherosclerosis of aorta ([place]) 02/02/2018  . Osteoarthritis of left knee 01/16/2018  . Osteoarthritis of right knee 01/16/2018  . Sequela of cerebrovascular accident 01/16/2018  . Paroxysmal atrial fibrillation ([place]) 12/19/2017  . Chronic combined systolic and diastolic CHF (congestive heart failure) ([place]) 12/16/2017  . Benign essential HTN   . Supplemental oxygen dependent   . 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 Coronary artery disease 11/23/2017  . NSTEMI (non-ST elevated myocardial infarction) ([place]) 11/16/2017  . Former smoker 10/13/2017  . Left middle cerebral artery stroke ([place]) 08/28/2017  . Cerebrovascular accident (CVA) due to occlusion of left middle cerebral artery ([place])   . Right hemiplegia ([place])   . Dysphagia, post-stroke   . Hyperlipidemia LDL goal <70     Willow Ora, PTA, [facility] [address] [place], [place] [address] [phone] 12/07/18, 5:06 PM   Name: MARTENA EMANUELE MRN: [id number] Date of Birth: December 25, 1960

## 2018-12-09 ENCOUNTER — Other Ambulatory Visit: Payer: Self-pay | Admitting: Family Medicine

## 2018-12-09 NOTE — Therapy (Signed)
[facility] [address] [place], [place], [address] Phone: [phone]   Fax:  [phone]  Occupational Therapy Treatment  Patient Details  Name: [name] MRN: [id number] Date of Birth: 06/21/1961 Referring Provider (OT): Marcello Fennel   Encounter Date: 12/07/2018  OT End of Session - 12/09/18 1743    Visit Number  3    Number of Visits  8    Date for OT Re-Evaluation  12/19/18    Authorization Type  BCBS - pt has 30 VL combined with PT    OT Start Time  1106    OT Stop Time  1145    OT Time Calculation (min)  39 min    Activity Tolerance  Patient tolerated treatment well    Behavior During Therapy  [facility] for tasks assessed/performed       Past Medical History:  Diagnosis Date  . Arthritis   . CAD (coronary artery disease)    a. s/p NSTEMI in 10/2017 with cath showing LM disease --> s/p CABG on 11/23/2017 with LIMA-LAD and SVG-OM.   [name] Hay fever   . Ischemic cardiomyopathy    a. EF 35-40% by echo in 10/2017  . NSTEMI (non-ST elevated myocardial infarction) ([facility])   . PAF (paroxysmal atrial fibrillation) ([facility])    Diagnosed by ILR  . Pneumonia 11/16/2017  . Stroke [facility]) 08/23/2017   Left MCA infarct status post TPA and thrombectomy    Past Surgical History:  Procedure Laterality Date  . CORONARY ARTERY BYPASS GRAFT N/A 11/23/2017   Procedure: CORONARY ARTERY BYPASS GRAFTING (CABG) x 2, ON PUMP, USING LEFT INTERNAL MAMMARY ARTERY AND RIGHT GREATER SAPHENOUS VEIN HARVESTED ENDOSCOPICALLY;  Surgeon: Alleen Borne, MD;  [facility];  Service: Open Heart Surgery;  Laterality: N/A;  . CRYOABLATION     cervical  . IR ANGIO INTRA EXTRACRAN SEL COM CAROTID INNOMINATE UNI R MOD SED  08/21/2017  . IR ANGIO VERTEBRAL SEL SUBCLAVIAN INNOMINATE UNI R MOD SED  08/21/2017  . IR PERCUTANEOUS ART THROMBECTOMY/INFUSION INTRACRANIAL INC DIAG ANGIO  08/21/2017  . IR RADIOLOGIST EVAL & MGMT  10/30/2017  . LOOP  RECORDER INSERTION N/A 08/28/2017   Procedure: LOOP RECORDER INSERTION;  Surgeon: Regan Lemming, MD;  [facility];  Service: Cardiovascular;  Laterality: N/A;  . RADIOLOGY WITH ANESTHESIA N/A 08/21/2017   Procedure: RADIOLOGY WITH ANESTHESIA;  Surgeon: Julieanne Cotton, MD;  [facility];  Service: Radiology;  Laterality: N/A;  . RIGHT/LEFT HEART CATH AND CORONARY ANGIOGRAPHY N/A 11/20/2017   Procedure: RIGHT/LEFT HEART CATH AND CORONARY ANGIOGRAPHY;  Surgeon: Corky Crafts, MD;  [facility]: [facility] INVASIVE CV LAB;  Service: Cardiovascular;  Laterality: N/A;  . TEE WITHOUT CARDIOVERSION N/A 08/28/2017   Procedure: TRANSESOPHAGEAL ECHOCARDIOGRAM (TEE);  Surgeon: Pricilla Riffle, MD;  Location: [facility] ENDOSCOPY;  Service: Cardiovascular;  Laterality: N/A;  . TEE WITHOUT CARDIOVERSION N/A 11/23/2017   Procedure: TRANSESOPHAGEAL ECHOCARDIOGRAM (TEE);  Surgeon: Alleen Borne, MD;  Location: [facility] OR;  Service: Open Heart Surgery;  Laterality: N/A;  . TUBAL LIGATION      There were no vitals filed for this visit.  Subjective Assessment - 12/09/18 1745    Currently in Pain?  Yes    Pain Score  8     Pain Location  Knee    Pain Orientation  Right;Left    Pain Descriptors / Indicators  Aching    Pain Type  Chronic pain    Pain Onset  More than a month ago    Pain Frequency  Constant    Aggravating Factors   standing    Pain Relieving Factors  sitting             Treatment: Seated edge of mat, weight bearing through RUE with body on arm movements, min-mod facilitation followed by AA/ROM with RUE supported on cane, min facilitation/ v.c Functional grasp/ release of a cup min-mod facilitation initally with RUE to drink using LUE to assist, pt was instructed to perform as home work- Pt returned demonstration following repetition/ review. Functional grasp/ release of checks on shelf liner with RUE, mod v.c/ facilation to avoid compensation using RUE  shoulder.                    OT Long Term Goals - 11/21/18 1043      OT LONG TERM GOAL #1   Title  Pt will be independent in updated HEP for RUE - 12/19/18     Status  New      OT LONG TERM GOAL #2   Title  Pt will self-report able to complete morning ADL routine in 45 min or less.     Baseline  currently reports takes approx 1hr to perform     Status  New      OT LONG TERM GOAL #3   Title  Pt will demonstrate ability to perform simple cold meal prep at mod independent.     Status  New      OT LONG TERM GOAL #4   Title  Pt will demonstrate ability to use RUE as gross assist 25% of the time within basic self care.     Status  New      OT LONG TERM GOAL #5   Title  Pt will demonstrate ability for bil low reach with 50* shoulder flexion during functional activities.     Status  New      Long Term Additional Goals   Additional Long Term Goals  Yes      OT LONG TERM GOAL #6   Title  Pt will demonstrate increased grip strength of 3lb or more in RUE to assist with bil reaching task.     Baseline  8lb     Status  New      OT LONG TERM GOAL #7   Title  Pt will verbalize understanding for RUE positionig in bed in preparation for returning to sleeping in bed vs recliner.     Status  New            Plan - 12/09/18 1755    Clinical Impression Statement  Pt is progressing towards goals. She demonstrates increasing functional use of RUE.    Occupational Profile and client history currently impacting functional performance  wife, homemaker, enjoys flowers/gardening  PMH: L MCA CVA (07/2017) s/p TPA, thrombectomy, loop recorder, CAD, CABG, cardiac debility, MI, tobacco use, HTN, bil knee OA    Occupational performance deficits (Please refer to evaluation for details):  ADL's;Leisure;IADL's;Rest and Sleep;Social Participation    Rehab Potential  Good    OT Treatment/Interventions  Self-care/ADL training;Therapeutic exercise;Balance training;Fluidtherapy;Manual  Therapy;Neuromuscular education;Patient/family education;Therapeutic activities;Passive range of motion;Energy conservation;DME and/or AE instruction;Functional Mobility Training;Splinting;Aquatic Therapy;Moist Heat    Plan  continue to address NMR for RUE/trunk, postural alignment as precursor for using RUE during functional tasks;     Consulted and Agree with Plan of Care  Patient;Family member/caregiver    Family Member Consulted  husband       Patient will benefit from skilled therapeutic intervention in order to improve the following deficits and impairments:  Body Structure / Function / Physical Skills  Visit Diagnosis: Muscle weakness (generalized)  Hemiplegia and hemiparesis following cerebral infarction affecting right dominant side (HCC)  Other lack of coordination    Problem List Patient Active Problem List   Diagnosis Date Noted  . Anxiety 10/24/2018  . Numbness of left hand 10/19/2018  . Spastic hemiparesis affecting dominant side ([facility]) 09/20/2018  . Primary osteoarthritis of both knees 07/04/2018  . Iron deficiency 04/23/2018  . Chronic urticaria 03/20/2018  . Atherosclerosis of aorta (H[facility]) 02/02/2018  . Osteoarthritis of left knee 01/16/2018  . Osteoarthritis of right knee 01/16/2018  . Sequela of cerebrovascular accident 01/16/2018  . Paroxysmal atrial fibrillation (HCC) 12/19/2017  . Chronic combined systolic and diastolic CHF (congestive heart failure) (HCC) 12/16/2017  . Benign essential HTN   . Supplemental oxygen dependent   . 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 Coronary artery disease 11/23/2017  . NSTEMI (non-ST elevated myocardial infarction) ([facility]) 11/16/2017  . Former smoker 10/13/2017  . 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 Cerebrovascular accident (CVA) due to occlusion of left middle cerebral artery ([facility])   . Right hemiplegia (H[facility])   . Dysphagia, post-stroke   . Hyperlipidemia LDL goal <70     Naz Denunzio 12/09/2018, 5:58 PM Keene Breath, OTR/L [phone] Phone: [phone] 5:58 PM 12/09/18 [facility] [address] [place], [place], [address] Phone: [phone]   Fax:  [phone]  Name: [name] MRN: [id number] Date of Birth: August 22, 1961

## 2018-12-11 ENCOUNTER — Ambulatory Visit: Payer: BLUE CROSS/BLUE SHIELD | Admitting: Physical Therapy

## 2018-12-11 ENCOUNTER — Ambulatory Visit: Payer: BLUE CROSS/BLUE SHIELD | Admitting: Occupational Therapy

## 2018-12-12 ENCOUNTER — Ambulatory Visit: Payer: BLUE CROSS/BLUE SHIELD | Admitting: Physical Therapy

## 2018-12-12 ENCOUNTER — Ambulatory Visit: Payer: BLUE CROSS/BLUE SHIELD | Admitting: Occupational Therapy

## 2018-12-14 ENCOUNTER — Encounter: Payer: BLUE CROSS/BLUE SHIELD | Admitting: Physical Medicine & Rehabilitation

## 2018-12-17 ENCOUNTER — Telehealth: Payer: Self-pay | Admitting: Physical Therapy

## 2018-12-17 NOTE — Telephone Encounter (Signed)
[name] was contacted today regarding the temporary closing of OP Rehab Services due to Covid-19.  Therapist discussed with pt her current home programs for both PT and OT. Pt stated she has no questions or issues at this time, both are going well. Pt has no other concerns. Pt was advised our front office will be contacting her to get back on our schedules when we are to reopen.   Patient [name] is interested in further information for an e-visit, virtual check in, or telehealth visit, if those services become available.    [facility] Rehabilitation Services will follow up with patients when we are able to resume care.  [name], PTA, [facility] [address] [place], [place] [address] [phone] 12/17/18, [facility] [address] [place], [place]  [address] Phone:  [phone] Fax:  [phone] \

## 2018-12-18 ENCOUNTER — Ambulatory Visit: Payer: BLUE CROSS/BLUE SHIELD | Admitting: Physical Therapy

## 2018-12-18 ENCOUNTER — Ambulatory Visit: Payer: BLUE CROSS/BLUE SHIELD | Admitting: Occupational Therapy

## 2018-12-21 ENCOUNTER — Encounter: Payer: BLUE CROSS/BLUE SHIELD | Admitting: Occupational Therapy

## 2018-12-21 ENCOUNTER — Ambulatory Visit: Payer: BLUE CROSS/BLUE SHIELD | Admitting: Rehabilitative and Restorative Service Providers"

## 2018-12-24 ENCOUNTER — Ambulatory Visit: Payer: BLUE CROSS/BLUE SHIELD | Admitting: Physical Therapy

## 2018-12-24 ENCOUNTER — Encounter: Payer: BLUE CROSS/BLUE SHIELD | Admitting: Occupational Therapy

## 2018-12-24 ENCOUNTER — Ambulatory Visit: Payer: BLUE CROSS/BLUE SHIELD | Admitting: Physical Medicine & Rehabilitation

## 2018-12-28 ENCOUNTER — Ambulatory Visit: Payer: BLUE CROSS/BLUE SHIELD | Admitting: Physical Therapy

## 2018-12-28 ENCOUNTER — Encounter: Payer: BLUE CROSS/BLUE SHIELD | Admitting: Occupational Therapy

## 2018-12-31 ENCOUNTER — Other Ambulatory Visit: Payer: Self-pay

## 2018-12-31 ENCOUNTER — Ambulatory Visit (INDEPENDENT_AMBULATORY_CARE_PROVIDER_SITE_OTHER): Payer: BLUE CROSS/BLUE SHIELD | Admitting: *Deleted

## 2018-12-31 DIAGNOSIS — I63512 Cerebral infarction due to unspecified occlusion or stenosis of left middle cerebral artery: Secondary | ICD-10-CM

## 2018-12-31 LAB — CUP PACEART REMOTE DEVICE CHECK
Date Time Interrogation Session: 20200406013617
Implantable Pulse Generator Implant Date: 20181203

## 2019-01-01 ENCOUNTER — Telehealth: Payer: Self-pay | Admitting: Physical Therapy

## 2019-01-01 NOTE — Telephone Encounter (Signed)
[name] was contacted today regarding temporary reduction of Outpatient [facility] due to concerns for community transmission of COVID-19.  Spoke with patient's husband.  Assessed if patient needed to be seen in person by clinician (recent fall or acute injury that requires hands on assessment and advice, change in diet order, post-surgical, special cases, etc.).     Patient did not have an acute/special need that requires in person visit. Proceeded with phone call.  Therapist advised the patient to continue to perform her HEP and assured she had no unanswered questions or concerns at this time.  The patient and husband were offered and declined the continuation of their plan of care by using methods such as an E-Visit, virtual check in, or Telehealth visit.  [facility] will follow up with this client when we are able to safely resume care at the [facility] in person.   Patient is aware we can be reached by telephone during limited business hours in the meantime.  Rico Junker, PT, DPT 01/01/19    1:00 PM

## 2019-01-07 ENCOUNTER — Other Ambulatory Visit: Payer: Self-pay | Admitting: Physical Medicine & Rehabilitation

## 2019-01-08 NOTE — Progress Notes (Signed)
[facility] Summary Report / Loop Recorder 

## 2019-02-01 ENCOUNTER — Ambulatory Visit (INDEPENDENT_AMBULATORY_CARE_PROVIDER_SITE_OTHER): Payer: BLUE CROSS/BLUE SHIELD | Admitting: *Deleted

## 2019-02-01 ENCOUNTER — Other Ambulatory Visit: Payer: Self-pay

## 2019-02-01 DIAGNOSIS — I63512 Cerebral infarction due to unspecified occlusion or stenosis of left middle cerebral artery: Secondary | ICD-10-CM

## 2019-02-03 LAB — CUP PACEART REMOTE DEVICE CHECK
Date Time Interrogation Session: 20200509013546
Implantable Pulse Generator Implant Date: 20181203

## 2019-02-05 NOTE — Progress Notes (Signed)
[facility] Summary Report / Loop Recorder 

## 2019-02-11 ENCOUNTER — Other Ambulatory Visit: Payer: Self-pay

## 2019-02-11 ENCOUNTER — Ambulatory Visit: Payer: BC Managed Care – PPO | Attending: Physical Medicine & Rehabilitation | Admitting: Occupational Therapy

## 2019-02-11 ENCOUNTER — Encounter: Payer: Self-pay | Admitting: Occupational Therapy

## 2019-02-11 DIAGNOSIS — R2681 Unsteadiness on feet: Secondary | ICD-10-CM | POA: Diagnosis not present

## 2019-02-11 DIAGNOSIS — M6281 Muscle weakness (generalized): Secondary | ICD-10-CM | POA: Diagnosis not present

## 2019-02-11 DIAGNOSIS — I69351 Hemiplegia and hemiparesis following cerebral infarction affecting right dominant side: Secondary | ICD-10-CM | POA: Insufficient documentation

## 2019-02-11 DIAGNOSIS — R293 Abnormal posture: Secondary | ICD-10-CM | POA: Diagnosis not present

## 2019-02-11 DIAGNOSIS — R278 Other lack of coordination: Secondary | ICD-10-CM | POA: Diagnosis not present

## 2019-02-11 NOTE — Therapy (Signed)
[facility] [address] [place], [place], [address] Phone: [phone]   Fax:  [phone]  Occupational Therapy Treatment  Patient Details  Name: [name] MRN: [id number] Date of Birth: 08-Sep-1961 Referring Provider (OT): Jamse Arn   Encounter Date: 02/11/2019  OT End of Session - 02/11/19 1629    Visit Number  4    Number of Visits  8   will reassess at end of visit 8 to determine if pt wishes to continue   Date for OT Re-Evaluation  03/14/19    Authorization Type  BCBS - pt has 30 VL combined with PT    OT Start Time  1403    OT Stop Time  1445    OT Time Calculation (min)  42 min    Activity Tolerance  Patient tolerated treatment well       Past Medical History:  Diagnosis Date  . Arthritis   . CAD (coronary artery disease)    a. s/p NSTEMI in 10/2017 with cath showing LM disease --> s/p CABG on 11/23/2017 with LIMA-LAD and SVG-OM.   [name] Hay fever   . Ischemic cardiomyopathy    a. EF 35-40% by echo in 10/2017  . NSTEMI (non-ST elevated myocardial infarction) ([place])   . PAF (paroxysmal atrial fibrillation) ([place])    Diagnosed by ILR  . Pneumonia 11/16/2017  . Stroke [facility]) 08/23/2017   Left MCA infarct status post TPA and thrombectomy    Past Surgical History:  Procedure Laterality Date  . CORONARY ARTERY BYPASS GRAFT N/A 11/23/2017   Procedure: CORONARY ARTERY BYPASS GRAFTING (CABG) x 2, ON PUMP, USING LEFT INTERNAL MAMMARY ARTERY AND RIGHT GREATER SAPHENOUS VEIN HARVESTED ENDOSCOPICALLY;  Surgeon: Gaye Pollack, MD;  Location: [facility];  Service: Open Heart Surgery;  Laterality: N/A;  . CRYOABLATION     cervical  . IR ANGIO INTRA EXTRACRAN SEL COM CAROTID INNOMINATE UNI R MOD SED  08/21/2017  . IR ANGIO VERTEBRAL SEL SUBCLAVIAN INNOMINATE UNI R MOD SED  08/21/2017  . IR PERCUTANEOUS ART THROMBECTOMY/INFUSION INTRACRANIAL INC DIAG ANGIO  08/21/2017  . IR RADIOLOGIST EVAL & MGMT  10/30/2017  .  LOOP RECORDER INSERTION N/A 08/28/2017   Procedure: LOOP RECORDER INSERTION;  Surgeon: Constance Haw, MD;  Location: [place] [facility];  Service: Cardiovascular;  Laterality: N/A;  . RADIOLOGY WITH ANESTHESIA N/A 08/21/2017   Procedure: RADIOLOGY WITH ANESTHESIA;  Surgeon: Luanne Bras, MD;  Location: [facility];  Service: Radiology;  Laterality: N/A;  . RIGHT/LEFT HEART CATH AND CORONARY ANGIOGRAPHY N/A 11/20/2017   Procedure: RIGHT/LEFT HEART CATH AND CORONARY ANGIOGRAPHY;  Surgeon: Jettie Booze, MD;  Location: [facility];  Service: Cardiovascular;  Laterality: N/A;  . TEE WITHOUT CARDIOVERSION N/A 08/28/2017   Procedure: TRANSESOPHAGEAL ECHOCARDIOGRAM (TEE);  Surgeon: Fay Records, MD;  Location: [facility];  Service: Cardiovascular;  Laterality: N/A;  . TEE WITHOUT CARDIOVERSION N/A 11/23/2017   Procedure: TRANSESOPHAGEAL ECHOCARDIOGRAM (TEE);  Surgeon: Gaye Pollack, MD;  Location: [facility];  Service: Open Heart Surgery;  Laterality: N/A;  . TUBAL LIGATION      There were no vitals filed for this visit.  Subjective Assessment - 02/11/19 1407    Subjective   I think I want to get the shots in my knees before I start PT.    Pertinent History  L MCA CVA in 07/2017 s/p TPA, thrombectomy; PMHx: loop recorder, CAD, CABG 10/2017, MI, bil knee OA  Patient Stated Goals  wants to walk better without AD; wants to be able to use R arm better, to write; less pain in L UE     Currently in Pain?  Yes    Pain Score  8     Pain Location  Knee    Pain Orientation  Right;Left    Pain Descriptors / Indicators  Aching    Pain Type  Chronic pain    Pain Onset  More than a month ago    Pain Frequency  Constant    Aggravating Factors   standing    Pain Relieving Factors  sitting    Effect of Pain on Daily Activities  limits activity standing/walking      Re-eval:  L grip strength remains at 8 pounds.  Pt prefers to defer LTG #3 for now.  Pt reports it still takes her over an hour  to complete ADL routine.  Pt has had no change since eval for shoulder flexion with R shoulder for low reach.  Pt was able to print first name with R hand today using L hand to assist and with increased time using pen with brown foam.  Pt has desire to improve in simple writing with RUE - will add goal. Will also add goal to incorporate RUE as gross assist during self feeding.  Pt in agreement.              OT Treatments/Exercises (OP) - 02/11/19 0001      ADLs   Writing  Addressed writing using brown foam with R hand. Pt able to print first name with min assist from L hand. Pt in agreement to set a goal for writing with R hand    ADL Comments  Addressed bed positioning for RUE to allow pt to hopefully sleep in bed. Pt has been sleeping in a recliner since her stroke due to shoulder pain when she attempts to lie down in bed. Educated pt on alignment of R shoulder girdle and R arm when laying supine as well as on her left side. Pt had no pain in either position.  Took picture of positioning with pt's cell phone with pt's permission for carry over at home. and pt given home work assignment of sleeping in her bed starting tonight. Also recommended that pt try to sleep with only 1 pillow to allow for improved head and neck alignment as well. Pt in agreement. Pt also educated on results of re-eval today and states she would like to do 1x/wk for both PT and OT to start. Pt wishes to obtain cortisone shots in both knees prior to starting PT.  Pt to call MD to arrange appt for shots and start PT and OT 1x/wk starting week of June 1st.               OT Education - 02/11/19 1625    Education Details  bed positioning for RUE pain    Person(s) Educated  Patient    Methods  Explanation;Demonstration;Other (comment)   picture on cell phone   Comprehension  Verbalized understanding;Returned demonstration          OT Long Term Goals - 02/11/19 1625      OT LONG TERM GOAL #1   Title  Pt will  be independent in updated HEP for RUE - 03/14/2019    Status  New      OT LONG TERM GOAL #2   Title  Pt will self-report able to complete  morning ADL routine in 45 min or less.     Baseline  currently reports takes approx 1hr to perform     Status  New      OT LONG TERM GOAL #3   Title  Pt will demonstrate ability to perform simple cold meal prep at mod independent.     Status  Deferred   per pt request     OT LONG TERM GOAL #4   Title  Pt will demonstrate ability to use RUE as gross assist 25% of the time within basic self care.     Status  New      OT LONG TERM GOAL #5   Title  Pt will demonstrate ability for bil low reach with 50* shoulder flexion during functional activities.     Status  New      OT LONG TERM GOAL #6   Title  Pt will demonstrate increased grip strength of 3lb or more in RUE to assist with bil reaching task.     Baseline  8lb     Status  New      OT LONG TERM GOAL #7   Title  Pt will verbalize understanding for RUE positionig in bed in preparation for returning to sleeping in bed vs recliner.     Status  Achieved      OT LONG TERM GOAL #8   Title  Pt will be able to write a simple sentence with RUE with min compensation and AE prn    Status  New      OT LONG TERM GOAL #9     Pt will demonstrate ability to incorporate RUE as gross assist into self feeding activities.         New      Plan - 02/11/19 1627    Clinical Impression Statement  Pt returns to the clinic today after being placed on hold due to COVID 19 - pt has low risk score of 3.  Pt very motivated to continue with therapy and to work toward more functional use of RUE.     Occupational Profile and client history currently impacting functional performance  wife, homemaker, enjoys flowers/gardening  PMH: L MCA CVA (07/2017) s/p TPA, thrombectomy, loop recorder, CAD, CABG, cardiac debility, MI, tobacco use, HTN, bil knee OA    Occupational performance deficits (Please refer to evaluation for  details):  ADL's;Leisure;IADL's;Rest and Sleep;Social Participation    Rehab Potential  Good    Clinical Decision Making  Limited treatment options, no task modification necessary    Comorbidities Affecting Occupational Performance:  May have comorbidities impacting occupational performance    OT Frequency  1x / week    OT Duration  4 weeks    OT Treatment/Interventions  Self-care/ADL training;Therapeutic exercise;Balance training;Fluidtherapy;Manual Therapy;Neuromuscular education;Patient/family education;Therapeutic activities;Passive range of motion;Energy conservation;DME and/or AE instruction;Functional Mobility Training;Splinting;Aquatic Therapy;Moist Heat    Plan  continue to address NMR for RUE/trunk, postural alignment - work toward low reach    [employer] and Agree with Plan of Care  Patient       Patient will benefit from skilled therapeutic intervention in order to improve the following deficits and impairments:  Body Structure / Function / Physical Skills  Visit Diagnosis: Muscle weakness (generalized)  Hemiplegia and hemiparesis following cerebral infarction affecting right dominant side (HCC)  Other lack of coordination  Abnormal posture  Unsteadiness on feet    Problem List Patient Active Problem List   Diagnosis Date Noted  . Anxiety 10/24/2018  .  Numbness of left hand 10/19/2018  . Spastic hemiparesis affecting dominant side ([place]) 09/20/2018  . Primary osteoarthritis of both knees 07/04/2018  . Iron deficiency 04/23/2018  . Chronic urticaria 03/20/2018  . Atherosclerosis of aorta ([place]) 02/02/2018  . Osteoarthritis of left knee 01/16/2018  . Osteoarthritis of right knee 01/16/2018  . Sequela of cerebrovascular accident 01/16/2018  . Paroxysmal atrial fibrillation ([place]) 12/19/2017  . Chronic combined systolic and diastolic CHF (congestive heart failure) ([place]) 12/16/2017  . Benign essential HTN   . Supplemental oxygen dependent   . 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  Coronary artery disease 11/23/2017  . NSTEMI (non-ST elevated myocardial infarction) ([place]) 11/16/2017  . Former smoker 10/13/2017  . 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 Cerebrovascular accident (CVA) due to occlusion of left middle cerebral artery ([place])   . Right hemiplegia ([place])   . Dysphagia, post-stroke   . Hyperlipidemia LDL goal <70     Quay Burow, OTR/L 02/11/2019, 4:36 PM  [facility] [address] [place], [place], [address] Phone: [phone]   Fax:  [phone]  Name: [name] MRN: [id number] Date of Birth: 01-Feb-1961

## 2019-02-13 ENCOUNTER — Telehealth: Payer: Self-pay | Admitting: Physical Medicine & Rehabilitation

## 2019-02-13 NOTE — Telephone Encounter (Signed)
Patient is calling to request knee injections--would like to know which one to schedule patient for.  She has had Zilretta and Synvisc injections.  Please advise.

## 2019-02-13 NOTE — Telephone Encounter (Signed)
It would have to be Zilretta.  Thanks.

## 2019-02-21 ENCOUNTER — Encounter: Payer: Self-pay | Admitting: Physical Medicine & Rehabilitation

## 2019-02-21 ENCOUNTER — Encounter
Payer: BC Managed Care – PPO | Attending: Physical Medicine & Rehabilitation | Admitting: Physical Medicine & Rehabilitation

## 2019-02-21 ENCOUNTER — Other Ambulatory Visit: Payer: Self-pay

## 2019-02-21 VITALS — BP 110/66 | HR 84 | Temp 98.2°F | Ht 60.0 in | Wt 129.0 lb

## 2019-02-21 DIAGNOSIS — M170 Bilateral primary osteoarthritis of knee: Secondary | ICD-10-CM | POA: Diagnosis not present

## 2019-02-21 MED ORDER — KETOROLAC TROMETHAMINE 30 MG/ML IJ SOLN
30.0000 mg | Freq: Once | INTRAMUSCULAR | Status: AC
  Administered 2019-02-21: 30 mg via INTRAMUSCULAR

## 2019-02-21 NOTE — Addendum Note (Signed)
Addended by: Marland Mcalpine B on: 02/21/2019 02:45 PM   Modules accepted: Orders

## 2019-02-21 NOTE — Progress Notes (Signed)
Ultrasound guided intraarticular knee injection  Indication: Knee OA not relieved by medication management and other conservative care.  Patient wish a diffuse rash, but insistent this was chronic and persistent on getting injection.  Informed consent was obtained after describing risks and benefits of the procedure with the patient, this includes bleeding, bruising, infection and medication side effects. The patient wishes to proceed and has given written consent. Patient was placed in a seated position with the ipsilateral hand placed on the contralateral shoulder. Bilateral knees were marked and prepped with betadine in the lateral supra IT band.  Using ultrasound tranducer was placed on the superior aspect of the knee, longitudinal view, visualizing the patellofemoral junction.  The transducer was then rotated for a cross sectional view.  The suprapatellar bursa was visualized.  Vapocoolant spray was applied.  A 22-gauge 2 inch needle was inserted (x2) into the supra IT band space  under with visualization until the needle was in the bursa. After negative draw back for blood, a solution containing 1 mL of 6 mg per ML betamethasone and 4 mL of 1% lidocaine was injected into bilateral knees. The patient tolerated the procedure well, but wanted to have a Toradol injection due to previous experience. Post procedure instructions were given.

## 2019-02-22 ENCOUNTER — Ambulatory Visit: Payer: BLUE CROSS/BLUE SHIELD | Admitting: Cardiology

## 2019-02-24 ENCOUNTER — Other Ambulatory Visit: Payer: Self-pay | Admitting: Physical Medicine & Rehabilitation

## 2019-02-28 ENCOUNTER — Ambulatory Visit: Payer: BC Managed Care – PPO | Attending: Physical Medicine & Rehabilitation | Admitting: Occupational Therapy

## 2019-02-28 ENCOUNTER — Ambulatory Visit: Payer: BC Managed Care – PPO | Admitting: Physical Therapy

## 2019-02-28 ENCOUNTER — Other Ambulatory Visit: Payer: Self-pay

## 2019-02-28 ENCOUNTER — Encounter: Payer: Self-pay | Admitting: Occupational Therapy

## 2019-02-28 DIAGNOSIS — I69351 Hemiplegia and hemiparesis following cerebral infarction affecting right dominant side: Secondary | ICD-10-CM

## 2019-02-28 DIAGNOSIS — R2681 Unsteadiness on feet: Secondary | ICD-10-CM | POA: Diagnosis not present

## 2019-02-28 DIAGNOSIS — R2689 Other abnormalities of gait and mobility: Secondary | ICD-10-CM

## 2019-02-28 DIAGNOSIS — R278 Other lack of coordination: Secondary | ICD-10-CM | POA: Insufficient documentation

## 2019-02-28 DIAGNOSIS — M6281 Muscle weakness (generalized): Secondary | ICD-10-CM | POA: Diagnosis not present

## 2019-02-28 DIAGNOSIS — R293 Abnormal posture: Secondary | ICD-10-CM | POA: Insufficient documentation

## 2019-02-28 NOTE — Therapy (Signed)
[facility] [address] [place], [place], [address] Phone: [phone]   Fax:  [phone]  Occupational Therapy Treatment  Patient Details  Name: [name] MRN: [id number] Date of Birth: 1960-11-17 Referring Provider (OT): Jamse Arn   Encounter Date: 02/28/2019  OT End of Session - 02/28/19 1517    Visit Number  5    Number of Visits  15    Date for OT Re-Evaluation  04/12/19    Authorization Type  BCBS - pt has 30 VL combined with PT; pt wishes to additional 10 visits for OT, 5 additional visits for PT and reserve 5 visits.     Authorization - Visit Number  5    Authorization - Number of Visits  15    OT Start Time  1402    OT Stop Time  1446    OT Time Calculation (min)  44 min    Activity Tolerance  Patient tolerated treatment well    Behavior During Therapy  WFL for tasks assessed/performed       Past Medical History:  Diagnosis Date  . Arthritis   . CAD (coronary artery disease)    a. s/p NSTEMI in 10/2017 with cath showing LM disease --> s/p CABG on 11/23/2017 with LIMA-LAD and SVG-OM.   [name] Hay fever   . Ischemic cardiomyopathy    a. EF 35-40% by echo in 10/2017  . NSTEMI (non-ST elevated myocardial infarction) ([place])   . PAF (paroxysmal atrial fibrillation) ([place])    Diagnosed by ILR  . Pneumonia 11/16/2017  . Stroke [facility]) 08/23/2017   Left MCA infarct status post TPA and thrombectomy    Past Surgical History:  Procedure Laterality Date  . CORONARY ARTERY BYPASS GRAFT N/A 11/23/2017   Procedure: CORONARY ARTERY BYPASS GRAFTING (CABG) x 2, ON PUMP, USING LEFT INTERNAL MAMMARY ARTERY AND RIGHT GREATER SAPHENOUS VEIN HARVESTED ENDOSCOPICALLY;  Surgeon: Gaye Pollack, MD;  Location: [facility];  Service: Open Heart Surgery;  Laterality: N/A;  . CRYOABLATION     cervical  . IR ANGIO INTRA EXTRACRAN SEL COM CAROTID INNOMINATE UNI R MOD SED  08/21/2017  . IR ANGIO VERTEBRAL SEL SUBCLAVIAN  INNOMINATE UNI R MOD SED  08/21/2017  . IR PERCUTANEOUS ART THROMBECTOMY/INFUSION INTRACRANIAL INC DIAG ANGIO  08/21/2017  . IR RADIOLOGIST EVAL & MGMT  10/30/2017  . LOOP RECORDER INSERTION N/A 08/28/2017   Procedure: LOOP RECORDER INSERTION;  Surgeon: Constance Haw, MD;  Location: [facility];  Service: Cardiovascular;  Laterality: N/A;  . RADIOLOGY WITH ANESTHESIA N/A 08/21/2017   Procedure: RADIOLOGY WITH ANESTHESIA;  Surgeon: Luanne Bras, MD;  Location: [facility];  Service: Radiology;  Laterality: N/A;  . RIGHT/LEFT HEART CATH AND CORONARY ANGIOGRAPHY N/A 11/20/2017   Procedure: RIGHT/LEFT HEART CATH AND CORONARY ANGIOGRAPHY;  Surgeon: Jettie Booze, MD;  Location: [facility];  Service: Cardiovascular;  Laterality: N/A;  . TEE WITHOUT CARDIOVERSION N/A 08/28/2017   Procedure: TRANSESOPHAGEAL ECHOCARDIOGRAM (TEE);  Surgeon: Fay Records, MD;  Location: [facility];  Service: Cardiovascular;  Laterality: N/A;  . TEE WITHOUT CARDIOVERSION N/A 11/23/2017   Procedure: TRANSESOPHAGEAL ECHOCARDIOGRAM (TEE);  Surgeon: Gaye Pollack, MD;  Location: [facility];  Service: Open Heart Surgery;  Laterality: N/A;  . TUBAL LIGATION      There were no vitals filed for this visit.  Subjective Assessment - 02/28/19 1359    Pertinent History  L MCA CVA in 07/2017 s/p TPA, thrombectomy; PMHx:  loop recorder, CAD, CABG 10/2017, MI, bil knee OA    Patient Stated Goals  wants to walk better without AD; wants to be able to use R arm better, to write; less pain in L UE     Currently in Pain?  Yes    Pain Score  5     Pain Location  Knee    Pain Orientation  Right;Left    Pain Descriptors / Indicators  Aching    Pain Type  Chronic pain    Pain Onset  More than a month ago    Pain Frequency  Constant    Aggravating Factors   standing    Pain Relieving Factors  non weight bearing positions relieve it                   OT Treatments/Exercises (OP) - 02/28/19 0001       ADLs   Eating  Practiced using red foam to build up handle of fork and using " assisted method" in order to use RUE for some eating functionally.  Also practiced using 2 hands for drinking from a cup. After instruction and practice pt able to return demonstrate.  Pt given goal of eating 25% of each meal with RUE and drinking at every meal using both hands. Pt verbalized understanding.     Bathing  Practiced using RUE for bathing - pt able to wash 75%.  Strongly encouraged pt to begin to use RUE functionally for bathing and eating. Discussed that functional use at this point is even more important than exercises. Pt verbalized understanding.     ADL Comments  Reviewed updated goals and POC with pt - pt in agreement. Pt wishes to use 10 more visits for OT and 5 more visits for PT (to maintain reserve of 5 visits for later this year). Discussed adding aquatic therapy and pt is interested pursuing this. Insurance has been verified.  Pt given handout out on aquatic therapy protocol and verbalized understanding.              OT Education - 02/28/19 1507    Education Details  home activities program  to incorporate RUE into functional tasks     Person(s) Educated  Patient    Methods  Explanation;Demonstration;Handout    Comprehension  Verbalized understanding;Returned demonstration       OT Short Term Goals - 02/28/19 1511      OT SHORT TERM GOAL #1   Title  Pt will be mod I with home activities program for RUE functional use - 03/14/2019    Status  New      OT SHORT TERM GOAL #2   Title  Pt will demonstrate ability to incorporate RUE into self feeding activities (LTG moved to STG)    Status  On-going      OT SHORT TERM GOAL #3   Title  Pt will demonstrate ability to use RUE as gross assist 25% of the time in basic self care (LTG moved to STG)    Status  On-going        OT Long Term Goals - 02/28/19 1508      OT LONG TERM GOAL #1   Title  Pt will be independent in updated HEP  including water based program for RUE - 04/12/2019    Status  On-going      OT LONG TERM GOAL #2   Title  Pt will self-report able to complete morning ADL routine in 45 min  or less.     Baseline  currently reports takes approx 1hr to perform     Status  On-going      OT LONG TERM GOAL #3   Title  Pt will demonstrate ability to perform simple cold meal prep at mod independent.     Status  Deferred   per pt request     OT LONG TERM GOAL #4   Title  Pt will demonstrate ability to use RUE as gross assist 50% of the time within basic self care.     Status  New      OT LONG TERM GOAL #5   Title  Pt will demonstrate ability for bil low reach with 50* shoulder flexion during functional activities.     Status  On-going      OT LONG TERM GOAL #6   Title  Pt will demonstrate increased grip strength of 3lb or more in RUE to assist with bil reaching task.     Baseline  8lb     Status  On-going      OT LONG TERM GOAL #7   Title  Pt will verbalize understanding for RUE positionig in bed in preparation for returning to sleeping in bed vs recliner.     Status  Achieved      OT LONG TERM GOAL #8   Title  Pt will be able to write a simple sentence with RUE with min compensation and AE prn    Status  On-going      OT LONG TERM GOAL  #9   TITLE  --    Status  --            Plan - 02/28/19 1515    Clinical Impression Statement  Pt progressing toward goals. Pt has decided to extend POC in order to work toward additional goals. See POC for details.     Occupational Profile and client history currently impacting functional performance  wife, homemaker, enjoys flowers/gardening  PMH: L MCA CVA (07/2017) s/p TPA, thrombectomy, loop recorder, CAD, CABG, cardiac debility, MI, tobacco use, HTN, bil knee OA    Occupational performance deficits (Please refer to evaluation for details):  ADL's;Leisure;IADL's;Rest and Sleep;Social Participation    Rehab Potential  Good    Clinical Decision Making   Limited treatment options, no task modification necessary    Comorbidities Affecting Occupational Performance:  May have comorbidities impacting occupational performance    OT Frequency  2x / week    OT Duration  Other (comment)   5 weeks   OT Treatment/Interventions  Self-care/ADL training;Therapeutic exercise;Balance training;Fluidtherapy;Manual Therapy;Neuromuscular education;Patient/family education;Therapeutic activities;Passive range of motion;Energy conservation;DME and/or AE instruction;Functional Mobility Training;Splinting;Aquatic Therapy;Moist Heat    Plan  check home activities program, NMR for RUE/trunk, postural alignment - work toward low reach; aquatic therapy    Consulted and Agree with Plan of Care  Patient       Patient will benefit from skilled therapeutic intervention in order to improve the following deficits and impairments:  Body Structure / Function / Physical Skills  Visit Diagnosis: Muscle weakness (generalized) - Plan: Ot plan of care cert/re-cert  Hemiplegia and hemiparesis following cerebral infarction affecting right dominant side ([place]) - Plan: Ot plan of care cert/re-cert  Other lack of coordination - Plan: Ot plan of care cert/re-cert  Abnormal posture - Plan: Ot plan of care cert/re-cert  Unsteadiness on feet - Plan: Ot plan of care cert/re-cert    Problem List Patient Active Problem List  Diagnosis Date Noted  . Anxiety 10/24/2018  . Numbness of left hand 10/19/2018  . Spastic hemiparesis affecting dominant side ([place]) 09/20/2018  . Primary osteoarthritis of both knees 07/04/2018  . Iron deficiency 04/23/2018  . Chronic urticaria 03/20/2018  . Atherosclerosis of aorta ([place]) 02/02/2018  . Osteoarthritis of left knee 01/16/2018  . Osteoarthritis of right knee 01/16/2018  . Sequela of cerebrovascular accident 01/16/2018  . Paroxysmal atrial fibrillation ([place]) 12/19/2017  . Chronic combined systolic and diastolic CHF (congestive heart failure)  ([place]) 12/16/2017  . Benign essential HTN   . Supplemental oxygen dependent   . 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 Coronary artery disease 11/23/2017  . NSTEMI (non-ST elevated myocardial infarction) ([place]) 11/16/2017  . Former smoker 10/13/2017  . Left middle cerebral artery stroke ([place]) 08/28/2017  . Cerebrovascular accident (CVA) due to occlusion of left middle cerebral artery ([place])   . Right hemiplegia ([place])   . Dysphagia, post-stroke   . Hyperlipidemia LDL goal <70     Quay Burow, OTR/L 02/28/2019, 3:22 PM  [facility] [address] [place], [place], [address] Phone: [phone]   Fax:  [phone]  Name: CLARYCE FRIEL MRN: [id number] Date of Birth: [birth year]

## 2019-02-28 NOTE — Patient Instructions (Signed)
HIP: Abduction / External Rotation (Band)    Place band around knees. Lie on side with hips and knees bent. Raise top knee up, squeezing glutes. Keep feet together. Hold _5__ seconds. Use _RED_______ band. _10__ reps per set, _1__ sets per day, __5_ days per week    SIDELYING LEG LIFT: ALSO DO WITH RIGHT LEG STRAIGHT - TRY TO KEEP FOOT STRAIGHT; DO 10-15 REPS     (Home) Hip: Internal Rotation - Sitting    Opposite side toward anchor, right foot in handle. Pull foot out. May hold chair to keep body still. Repeat _10__ times per set. Do _1___ sets per session. Do _1___ sessions per week.   Therapeutic - Bridging; do 1/2 BRIDGE - RIGHT LEG IS BENT, LEFT LEG IS STRAIGHT    Lift buttocks, keeping back straight and arms on floor. Hold __5__ seconds. Repeat __10__ times.     Dorsiflexion: Resisted - DO IN SEATED POSITION; HOLD BAND DOWN WITH LEFT FOOT - KEEP HEEL ON FLOOR - TURN RIGHT FOOT OUT AND LIFT UP    Facing anchor, tubing around left foot, pull toward face.  Repeat __10__ times per set. Do __1__ sets per session. Do _1___ sessions per day.  http://[email or web address].[email or web address]     Straight Leg Raise    Tighten stomach and slowly raise locked right leg __10__ inches from floor. Repeat __10__ times per set. Do _1___ sets per session. Do __1__ sessions per day.  Use RED BAND - PLACE AROUND BOTH LEGS  http://orth.exer.us/1103    Hip External Rotation With Knee Extension DO IN SEATED POSITION - ROLL LEG OUT    One knee bent, one leg straight. Roll bent knee out, then lift foot and straighten leg. Be sure pelvis does not rotate. Do _10__ times. Restabilize pelvis. Repeat with other leg. Do 1___ sets, ___1 times per day.  http://ss.exer.[email or web address]      LOOK FOR AIR CAST - [place]

## 2019-02-28 NOTE — Patient Instructions (Signed)
Its important to start using your right arm functionally as much as possible. You have some great movement in your arm so lets use it!!  At meals: 1. Use the red foam to build up the handle on your fork. Using the "assisted" method (using your left hand to take off the weight of your right hand) try and eat at least 25% of your meal with your right arm.   2. At each meal use BOTH hands to drink from a cup - every single sip!!  When bathing: 1. Use your right hand to wash as much as you can (you can always go back over the area with your left hand if you don't feel clean enough).  In the clinic you did about 75% of your body!!!

## 2019-03-01 NOTE — Therapy (Signed)
[facility] [address] [place], [place], [address] Phone: [phone]   Fax:  [phone]  Physical Therapy Treatment  Patient Details  Name: [name] MRN: [id number] Date of Birth: [birth year] Referring Provider (PT): Jamse Arn, MD   CLINIC OPERATION CHANGES: [facility] is open at lower capacity following universal masking, social distancing, and patient screening.  The patient's COVID risk of complications score is 3.   Encounter Date: 02/28/2019  PT End of Session - 03/01/19 1039    Visit Number  3    Number of Visits  8    Authorization Type  BCBS (Ded & Oop have been met. Pt has no financial responsibility). 30 visit limit for PT/OT combined.    PT Start Time  1300    PT Stop Time  1350    PT Time Calculation (min)  50 min    Activity Tolerance  Patient tolerated treatment well    Behavior During Therapy  WFL for tasks assessed/performed       Past Medical History:  Diagnosis Date  . Arthritis   . CAD (coronary artery disease)    a. s/p NSTEMI in 10/2017 with cath showing LM disease --> s/p CABG on 11/23/2017 with LIMA-LAD and SVG-OM.   [name] Hay fever   . Ischemic cardiomyopathy    a. EF 35-40% by echo in 10/2017  . NSTEMI (non-ST elevated myocardial infarction) ([place])   . PAF (paroxysmal atrial fibrillation) ([place])    Diagnosed by ILR  . Pneumonia 11/16/2017  . Stroke [facility]) 08/23/2017   Left MCA infarct status post TPA and thrombectomy    Past Surgical History:  Procedure Laterality Date  . CORONARY ARTERY BYPASS GRAFT N/A 11/23/2017   Procedure: CORONARY ARTERY BYPASS GRAFTING (CABG) x 2, ON PUMP, USING LEFT INTERNAL MAMMARY ARTERY AND RIGHT GREATER SAPHENOUS VEIN HARVESTED ENDOSCOPICALLY;  Surgeon: Gaye Pollack, MD;  Location: [facility];  Service: Open Heart Surgery;  Laterality: N/A;  . CRYOABLATION     cervical  . IR ANGIO INTRA EXTRACRAN SEL COM CAROTID INNOMINATE UNI R MOD  SED  08/21/2017  . IR ANGIO VERTEBRAL SEL SUBCLAVIAN INNOMINATE UNI R MOD SED  08/21/2017  . IR PERCUTANEOUS ART THROMBECTOMY/INFUSION INTRACRANIAL INC DIAG ANGIO  08/21/2017  . IR RADIOLOGIST EVAL & MGMT  10/30/2017  . LOOP RECORDER INSERTION N/A 08/28/2017   Procedure: LOOP RECORDER INSERTION;  Surgeon: Constance Haw, MD;  Location: [facility];  Service: Cardiovascular;  Laterality: N/A;  . RADIOLOGY WITH ANESTHESIA N/A 08/21/2017   Procedure: RADIOLOGY WITH ANESTHESIA;  Surgeon: Luanne Bras, MD;  Location: [facility];  Service: Radiology;  Laterality: N/A;  . RIGHT/LEFT HEART CATH AND CORONARY ANGIOGRAPHY N/A 11/20/2017   Procedure: RIGHT/LEFT HEART CATH AND CORONARY ANGIOGRAPHY;  Surgeon: Jettie Booze, MD;  Location: [facility];  Service: Cardiovascular;  Laterality: N/A;  . TEE WITHOUT CARDIOVERSION N/A 08/28/2017   Procedure: TRANSESOPHAGEAL ECHOCARDIOGRAM (TEE);  Surgeon: Fay Records, MD;  Location: [facility];  Service: Cardiovascular;  Laterality: N/A;  . TEE WITHOUT CARDIOVERSION N/A 11/23/2017   Procedure: TRANSESOPHAGEAL ECHOCARDIOGRAM (TEE);  Surgeon: Gaye Pollack, MD;  Location: [facility];  Service: Open Heart Surgery;  Laterality: N/A;  . TUBAL LIGATION      There were no vitals filed for this visit.  Subjective Assessment - 03/01/19 1003    Subjective  Pt reports she has had injections in bil knees approx. 2 weeks ago - helped  some; pt reports Rt ankle will turn (invert) if she is not careful, esp. with walking outside on uneven surface    Limitations  Standing;Walking    Patient Stated Goals  walk without knee issues, decrease knee pain with activity, improve posture and body mechanics during gait; per husband - get back to dancing                        [facility] Adult PT Treatment/Exercise - 03/01/19 0001      Ambulation/Gait   Ambulation/Gait  Yes    Ambulation/Gait Assistance  5: Supervision    Ambulation/Gait Assistance  Details  air cast used on RLE    Ambulation Distance (Feet)  115 Feet    Assistive device  Small based quad cane    Gait Pattern  Step-through pattern;Decreased arm swing - right;Decreased stride length;Decreased arm swing - left;Antalgic    Ambulation Surface  Level;Indoor      Exercises   Exercises  Lumbar;Knee/Hip;Ankle      Knee/Hip Exercises: Supine   Bridges  AROM;1 set;10 reps    Single Leg Bridge  AROM;Right;1 set;10 reps    Straight Leg Raises  AROM;Right;1 set;10 reps    Other Supine Knee/Hip Exercises  RLE hip external rotation 10 reps      Knee/Hip Exercises: Sidelying   Hip ABduction  AROM;Right;1 set;10 reps    Clams  10 reps RLE      Knee/Hip Exercises: Prone   Hamstring Curl  1 set;10 reps   RLE   Hip Extension  AROM;Right;1 set;10 reps      Ankle Exercises: Seated   Other Seated Ankle Exercises  seated Rt dorsiflexion with red theraband 10 reps 3 sec hold             PT Education - 03/01/19 1037    Education Details  see pt instructions    Person(s) Educated  Patient    Methods  Explanation;Demonstration;Handout    Comprehension  Verbalized understanding;Returned demonstration       PT Short Term Goals - 03/01/19 1042      PT SHORT TERM GOAL #1   Title  Pt will verbalize understanding of initial HEP.     Time  4    Period  Weeks    Status  New    Target Date  03/29/19      PT SHORT TERM GOAL #2   Title  Pt is able to ambulate, carry on a conversation, and scan environment with no loss of balance     Status  Deferred      PT SHORT TERM GOAL #3   Title  Patient verbalizes understanding for recommendations for assistive device for safety & decrease stress for OA.     Status  Deferred      PT SHORT TERM GOAL #4   Time  4        PT Long Term Goals - 03/01/19 1043      PT LONG TERM GOAL #1   Title  Patient verbalizes & demonstrates of ongoing HEP & community fitness program.    Time  8    Period  Weeks    Status  New    Target Date   04/30/19      PT LONG TERM GOAL #2   Title  Improve difference between normal and cognitive TUG to be <10% to decrease falls risk    Status  Deferred      PT  LONG TERM GOAL #3   Title  patient ambulates 1000' outdoors including grass modified Independent with LRAD and no loss of balance - REVISED to 500' with air cast on RLE with [facility]    Time  8    Period  Weeks    Status  Revised    Target Date  04/30/19      PT LONG TERM GOAL #4   Title  Improve gait velocity to >2.62 ft/sec with LRAD to improve community mobility     Baseline  2.23 ft/sec on 3/11     Time  8    Period  Weeks    Status  New    Target Date  04/30/19      PT LONG TERM GOAL #5   Title  Patient negotiates ramps, curbs & stairs with LRAD modified independent to enable community access.     Baseline  02/06/18 Pt/husband report pt is walking around the barn (~120 ft) over unlevel ground (deferred attempting 500 ft due to bil knee pain and pt anxious not to do anything to incr pain)    Time  8    Period  Weeks    Status  New    Target Date  04/30/19      PT LONG TERM GOAL #6   Title  Patient verbalizes how to manage chronic OA knee pain including modifying activity level & assistive device.     Status  Deferred            Plan - 03/01/19 1039    Clinical Impression Statement  Pt presents with increased tone in RLE due to h/o Lt CVA in 2018: pt's Rt ankle inverts with decreased safety with amb. on uneven surface/terrain; air cast trialed on RLE and was beneficial in maintaining Rt ankle in neutral position to minimize inversion which could result in Rt ankle sprain with amb. on uneven surfaces    Personal Factors and Comorbidities  Comorbidity 3+;Time since onset of injury/illness/exacerbation    Comorbidities  CVA 08/23/2017, CAD, NSTEMI, A-Fib, 11/23/2017 CABGx2,     Examination-Activity Limitations  Squat;Stand;Locomotion Level    Examination-Participation Restrictions  Shop;Community Activity    Rehab  Potential  Good    PT Frequency  Other (comment)    PT Duration  6 weeks    PT Treatment/Interventions  Gait training;Stair training;Functional mobility training;DME Instruction;Therapeutic exercise;Therapeutic activities;Balance training;Neuromuscular re-education;Patient/family education;ADLs/Self Care Home Management;Manual techniques;Moist Heat    PT Next Visit Plan  check HEP - cont RLE strengthening    Consulted and Agree with Plan of Care  Patient       Patient will benefit from skilled therapeutic intervention in order to improve the following deficits and impairments:  Abnormal gait, Decreased range of motion, Impaired tone, Decreased balance, Decreased knowledge of use of DME, Impaired flexibility, Improper body mechanics, Decreased mobility, Postural dysfunction  Visit Diagnosis: Hemiplegia and hemiparesis following cerebral infarction affecting right dominant side (HCC)  Muscle weakness (generalized)  Other abnormalities of gait and mobility     Problem List Patient Active Problem List   Diagnosis Date Noted  . Anxiety 10/24/2018  . Numbness of left hand 10/19/2018  . Spastic hemiparesis affecting dominant side ([place]) 09/20/2018  . Primary osteoarthritis of both knees 07/04/2018  . Iron deficiency 04/23/2018  . Chronic urticaria 03/20/2018  . Atherosclerosis of aorta ([place]) 02/02/2018  . Osteoarthritis of left knee 01/16/2018  . Osteoarthritis of right knee 01/16/2018  . Sequela of cerebrovascular accident 01/16/2018  . Paroxysmal  atrial fibrillation ([place]) 12/19/2017  . Chronic combined systolic and diastolic CHF (congestive heart failure) ([place]) 12/16/2017  . Benign essential HTN   . Supplemental oxygen dependent   . 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 Coronary artery disease 11/23/2017  . NSTEMI (non-ST elevated myocardial infarction) ([place]) 11/16/2017  . Former smoker 10/13/2017  . 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 Cerebrovascular accident (CVA) due  to occlusion of left middle cerebral artery ([place])   . Right hemiplegia ([place])   . Dysphagia, post-stroke   . Hyperlipidemia LDL goal <70     Alda Lea, PT 03/01/2019, 10:46 AM  [facility] [address] [place], [place], [address] Phone: [phone]   Fax:  [phone]  Name: [name] MRN: [id number] Date of Birth: [birth year]

## 2019-03-06 ENCOUNTER — Ambulatory Visit (INDEPENDENT_AMBULATORY_CARE_PROVIDER_SITE_OTHER): Payer: BC Managed Care – PPO | Admitting: *Deleted

## 2019-03-06 DIAGNOSIS — I63512 Cerebral infarction due to unspecified occlusion or stenosis of left middle cerebral artery: Secondary | ICD-10-CM | POA: Diagnosis not present

## 2019-03-07 ENCOUNTER — Ambulatory Visit: Payer: BC Managed Care – PPO | Admitting: Occupational Therapy

## 2019-03-07 ENCOUNTER — Ambulatory Visit: Payer: BC Managed Care – PPO | Admitting: Physical Therapy

## 2019-03-07 ENCOUNTER — Encounter: Payer: Self-pay | Admitting: Occupational Therapy

## 2019-03-07 ENCOUNTER — Other Ambulatory Visit: Payer: Self-pay

## 2019-03-07 DIAGNOSIS — R2689 Other abnormalities of gait and mobility: Secondary | ICD-10-CM | POA: Diagnosis not present

## 2019-03-07 DIAGNOSIS — R293 Abnormal posture: Secondary | ICD-10-CM

## 2019-03-07 DIAGNOSIS — I69351 Hemiplegia and hemiparesis following cerebral infarction affecting right dominant side: Secondary | ICD-10-CM

## 2019-03-07 DIAGNOSIS — R2681 Unsteadiness on feet: Secondary | ICD-10-CM | POA: Diagnosis not present

## 2019-03-07 DIAGNOSIS — R278 Other lack of coordination: Secondary | ICD-10-CM

## 2019-03-07 DIAGNOSIS — M6281 Muscle weakness (generalized): Secondary | ICD-10-CM

## 2019-03-07 LAB — CUP PACEART REMOTE DEVICE CHECK
Date Time Interrogation Session: 20200611020648
Implantable Pulse Generator Implant Date: 20181203

## 2019-03-07 NOTE — Patient Instructions (Addendum)
Have your husband go to [employer] to get what you need to make the PVC square.  You will need:  1. 4 pieces of 3/4 inch PVC pipe cut into 4 pieces - 22 inches long each. 2. 4 pvc corners to make a square.  We will eventually be using this for a home program but NOT YET.  Just get it made it for now.  Add this to your home program for your arm: 1. Sit on firm surface. 2. Hold a pipe in your right hand with one end on the floor.  Support the weight of the right  arm by holding under your right elbow with your left hand. 3. While holding the pipe, rotate the pipe to the right and then to the left. GO SLOWLY. You should only be moving at your wrist and holding on with your hand.  Do 10 in the morning and 10 at night - DO NOT DO MORE THAN I SAY.

## 2019-03-07 NOTE — Therapy (Signed)
[facility] [address] [place], [place], [address] Phone: [phone]   Fax:  [phone]  Occupational Therapy Treatment  Patient Details  Name: MUSETTE KISAMORE MRN: [id number] Date of Birth: [birth year] Referring Provider (OT): Jamse Arn   Encounter Date: 03/07/2019  OT End of Session - 03/07/19 1519    Visit Number  6    Number of Visits  15    Date for OT Re-Evaluation  04/12/19    Authorization Type  BCBS - pt has 30 VL combined with PT; pt wishes to additional 10 visits for OT, 5 additional visits for PT and reserve 5 visits.     Authorization - Visit Number  6    Authorization - Number of Visits  15    OT Start Time  1402    OT Stop Time  4196    OT Time Calculation (min)  45 min    Activity Tolerance  Patient tolerated treatment well       Past Medical History:  Diagnosis Date  . Arthritis   . CAD (coronary artery disease)    a. s/p NSTEMI in 10/2017 with cath showing LM disease --> s/p CABG on 11/23/2017 with LIMA-LAD and SVG-OM.   [name] Hay fever   . Ischemic cardiomyopathy    a. EF 35-40% by echo in 10/2017  . NSTEMI (non-ST elevated myocardial infarction) ([place])   . PAF (paroxysmal atrial fibrillation) ([place])    Diagnosed by ILR  . Pneumonia 11/16/2017  . Stroke [facility]) 08/23/2017   Left MCA infarct status post TPA and thrombectomy    Past Surgical History:  Procedure Laterality Date  . CORONARY ARTERY BYPASS GRAFT N/A 11/23/2017   Procedure: CORONARY ARTERY BYPASS GRAFTING (CABG) x 2, ON PUMP, USING LEFT INTERNAL MAMMARY ARTERY AND RIGHT GREATER SAPHENOUS VEIN HARVESTED ENDOSCOPICALLY;  Surgeon: Gaye Pollack, MD;  Location: [facility];  Service: Open Heart Surgery;  Laterality: N/A;  . CRYOABLATION     cervical  . IR ANGIO INTRA EXTRACRAN SEL COM CAROTID INNOMINATE UNI R MOD SED  08/21/2017  . IR ANGIO VERTEBRAL SEL SUBCLAVIAN INNOMINATE UNI R MOD SED  08/21/2017  . IR PERCUTANEOUS ART  THROMBECTOMY/INFUSION INTRACRANIAL INC DIAG ANGIO  08/21/2017  . IR RADIOLOGIST EVAL & MGMT  10/30/2017  . LOOP RECORDER INSERTION N/A 08/28/2017   Procedure: LOOP RECORDER INSERTION;  Surgeon: Constance Haw, MD;  Location: [facility];  Service: Cardiovascular;  Laterality: N/A;  . RADIOLOGY WITH ANESTHESIA N/A 08/21/2017   Procedure: RADIOLOGY WITH ANESTHESIA;  Surgeon: Luanne Bras, MD;  Location: [facility];  Service: Radiology;  Laterality: N/A;  . RIGHT/LEFT HEART CATH AND CORONARY ANGIOGRAPHY N/A 11/20/2017   Procedure: RIGHT/LEFT HEART CATH AND CORONARY ANGIOGRAPHY;  Surgeon: Jettie Booze, MD;  Location: [facility];  Service: Cardiovascular;  Laterality: N/A;  . TEE WITHOUT CARDIOVERSION N/A 08/28/2017   Procedure: TRANSESOPHAGEAL ECHOCARDIOGRAM (TEE);  Surgeon: Fay Records, MD;  Location: [facility];  Service: Cardiovascular;  Laterality: N/A;  . TEE WITHOUT CARDIOVERSION N/A 11/23/2017   Procedure: TRANSESOPHAGEAL ECHOCARDIOGRAM (TEE);  Surgeon: Gaye Pollack, MD;  Location: [facility];  Service: Open Heart Surgery;  Laterality: N/A;  . TUBAL LIGATION      There were no vitals filed for this visit.  Subjective Assessment - 03/07/19 1403    Pertinent History  L MCA CVA in 07/2017 s/p TPA, thrombectomy; PMHx: loop recorder, CAD, CABG 10/2017, MI, bil knee OA  Patient Stated Goals  wants to walk better without AD; wants to be able to use R arm better, to write; less pain in L UE     Currently in Pain?  Yes    Pain Score  5     Pain Location  Knee    Pain Orientation  Right;Left    Pain Descriptors / Indicators  Sore    Pain Type  Chronic pain    Pain Onset  More than a month ago    Pain Frequency  Constant    Aggravating Factors   standing    Pain Relieving Factors  non weight bearing positions relieve it, shots helped some                   OT Treatments/Exercises (OP) - 03/07/19 0001      Neurological Re-education Exercises   Other  Exercises 1  Neuro re ed in supine to address billateral mid and overhead reach in closed chain activity with emphasis on incorporation of grip as well as maintaining wrist extension to improve ability to hand orientation for functional use.  Also addressed isolated elbow extension in the position as well.  Transitioned into sitting and addressed unilateral mid reach with emphasis on reaching with hand (vs attempting to "place" hand with shoulder) and incorporation of grip and wrist stabilization. Also addressed isolated wrist flexion/extension using dowel. Pt given last activity for HEP - see pt instruction section for details.  Pt able to return demonstrate activity. Pt states she is also using her RUE functionally more at home.               OT Education - 03/07/19 1513    Education Details  HEP for wrist flexion/extension;  will add to program next session    Person(s) Educated  Patient    Methods  Explanation;Demonstration;Handout    Comprehension  Verbalized understanding;Returned demonstration       OT Short Term Goals - 03/07/19 1514      OT SHORT TERM GOAL #1   Title  Pt will be mod I with home activities program for RUE functional use - 03/14/2019    Status  On-going      OT SHORT TERM GOAL #2   Title  Pt will demonstrate ability to incorporate RUE into self feeding activities (LTG moved to STG)    Status  On-going      OT SHORT TERM GOAL #3   Title  Pt will demonstrate ability to use RUE as gross assist 25% of the time in basic self care (LTG moved to STG)    Status  On-going        OT Long Term Goals - 03/07/19 1514      OT LONG TERM GOAL #1   Title  Pt will be independent in updated HEP including water based program for RUE - 04/12/2019    Status  On-going      OT LONG TERM GOAL #2   Title  Pt will self-report able to complete morning ADL routine in 45 min or less.     Baseline  currently reports takes approx 1hr to perform     Status  On-going      OT LONG  TERM GOAL #3   Title  Pt will demonstrate ability to perform simple cold meal prep at mod independent.     Status  Deferred   per pt request     OT LONG TERM GOAL #4  Title  Pt will demonstrate ability to use RUE as gross assist 50% of the time within basic self care.     Status  On-going      OT LONG TERM GOAL #5   Title  Pt will demonstrate ability for bil low reach with 50* shoulder flexion during functional activities.     Status  On-going      OT LONG TERM GOAL #6   Title  Pt will demonstrate increased grip strength of 3lb or more in RUE to assist with bil reaching task.     Baseline  8lb     Status  On-going      OT LONG TERM GOAL #7   Title  Pt will verbalize understanding for RUE positionig in bed in preparation for returning to sleeping in bed vs recliner.     Status  Achieved      OT LONG TERM GOAL #8   Title  Pt will be able to write a simple sentence with RUE with min compensation and AE prn    Status  On-going      OT LONG TERM GOAL  #9   TITLE  Pt will demonstrate ability to incorporate RUE as gross assist during self feeding activities    Status  On-going            Plan - 03/07/19 1515    Clinical Impression Statement  Pt progressng toward goals. Pt reports she is using her RUE more at home for functional tasks    OT Occupational Profile and History  Problem Focused Assessment - Including review of records relating to presenting problem    Occupational Profile and client history currently impacting functional performance  wife, homemaker, enjoys flowers/gardening  PMH: L MCA CVA (07/2017) s/p TPA, thrombectomy, loop recorder, CAD, CABG, cardiac debility, MI, tobacco use, HTN, bil knee OA    Occupational performance deficits (Please refer to evaluation for details):  ADL's;Leisure;IADL's;Rest and Sleep;Social Participation    Rehab Potential  Good    Clinical Decision Making  Limited treatment options, no task modification necessary    Comorbidities  Affecting Occupational Performance:  May have comorbidities impacting occupational performance    OT Frequency  2x / week    OT Duration  Other (comment)   5 weeks   OT Treatment/Interventions  Self-care/ADL training;Therapeutic exercise;Balance training;Fluidtherapy;Manual Therapy;Neuromuscular education;Patient/family education;Therapeutic activities;Passive range of motion;Energy conservation;DME and/or AE instruction;Functional Mobility Training;Splinting;Aquatic Therapy;Moist Heat    Plan  check HEP and add as possible,  NMR for RUE/trunk, postural alignment - work toward low reach; aquatic therapy    Consulted and Agree with Plan of Care  Patient       Patient will benefit from skilled therapeutic intervention in order to improve the following deficits and impairments:  Body Structure / Function / Physical Skills  Visit Diagnosis: Hemiplegia and hemiparesis following cerebral infarction affecting right dominant side (HCC)  Muscle weakness (generalized)  Abnormal posture  Unsteadiness on feet  Other lack of coordination    Problem List Patient Active Problem List   Diagnosis Date Noted  . Anxiety 10/24/2018  . Numbness of left hand 10/19/2018  . Spastic hemiparesis affecting dominant side ([place]) 09/20/2018  . Primary osteoarthritis of both knees 07/04/2018  . Iron deficiency 04/23/2018  . Chronic urticaria 03/20/2018  . Atherosclerosis of aorta ([place]) 02/02/2018  . Osteoarthritis of left knee 01/16/2018  . Osteoarthritis of right knee 01/16/2018  . Sequela of cerebrovascular accident 01/16/2018  . Paroxysmal atrial fibrillation (HCC)  12/19/2017  . Chronic combined systolic and diastolic CHF (congestive heart failure) ([place]) 12/16/2017  . Benign essential HTN   . Supplemental oxygen dependent   . 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 Coronary artery disease 11/23/2017  . NSTEMI (non-ST elevated myocardial infarction) ([place]) 11/16/2017  . Former smoker 10/13/2017  . Left middle  cerebral artery stroke ([place]) 08/28/2017  . Cerebrovascular accident (CVA) due to occlusion of left middle cerebral artery ([place])   . Right hemiplegia ([place])   . Dysphagia, post-stroke   . Hyperlipidemia LDL goal <70     Quay Burow, OTR/L 03/07/2019, 3:22 PM  [facility] [address] [place], [place], [address] Phone: [phone]   Fax:  [phone]  Name: HAFSAH HENDLER MRN: [id number] Date of Birth: Oct 15, 1960

## 2019-03-07 NOTE — Patient Instructions (Signed)
HIP: Abduction - Side-Lying    Lie on side, legs straight and in line with trunk. Squeeze glutes. Raise top leg up and slightly back. Point toes forward. _10__ reps per set, _2_ sets per day, __5_ days per week Bend bottom leg to stabilize pelvis.   3# weight was placed just above Rt knee  Lift leg - make circles clockwise and counterclockwise - 5-10 reps each Copyright  VHI. All rights reserved.

## 2019-03-08 NOTE — Therapy (Signed)
[facility] [address] [place], [place], [address] Phone: [phone]   Fax:  [phone]  Physical Therapy Treatment  Patient Details  Name: [name] MRN: [id number] Date of Birth: 1961-07-13 Referring Provider (PT): Jamse Arn, MD  CLINIC OPERATION CHANGES: [facility] is open at lower capacity following universal masking, social distancing, and patient screening.  The patient's COVID risk of  complications score is 3.  Encounter Date: 03/07/2019  PT End of Session - 03/08/19 1634    Visit Number  4    Number of Visits  8    Date for PT Re-Evaluation  04/30/19    Authorization Type  BCBS (Ded & Oop have been met. Pt has no financial responsibility). 30 visit limit for PT/OT combined.    Authorization - Visit Number  4    Authorization - Number of Visits  30    PT Start Time  1301    PT Stop Time  8003    PT Time Calculation (min)  48 min    Activity Tolerance  Patient tolerated treatment well    Behavior During Therapy  WFL for tasks assessed/performed       Past Medical History:  Diagnosis Date  . Arthritis   . CAD (coronary artery disease)    a. s/p NSTEMI in 10/2017 with cath showing LM disease --> s/p CABG on 11/23/2017 with LIMA-LAD and SVG-OM.   [name] Hay fever   . Ischemic cardiomyopathy    a. EF 35-40% by echo in 10/2017  . NSTEMI (non-ST elevated myocardial infarction) ([place])   . PAF (paroxysmal atrial fibrillation) ([place])    Diagnosed by ILR  . Pneumonia 11/16/2017  . Stroke [facility]) 08/23/2017   Left MCA infarct status post TPA and thrombectomy    Past Surgical History:  Procedure Laterality Date  . CORONARY ARTERY BYPASS GRAFT N/A 11/23/2017   Procedure: CORONARY ARTERY BYPASS GRAFTING (CABG) x 2, ON PUMP, USING LEFT INTERNAL MAMMARY ARTERY AND RIGHT GREATER SAPHENOUS VEIN HARVESTED ENDOSCOPICALLY;  Surgeon: Gaye Pollack, MD;  Location: [facility];  Service: Open Heart Surgery;   Laterality: N/A;  . CRYOABLATION     cervical  . IR ANGIO INTRA EXTRACRAN SEL COM CAROTID INNOMINATE UNI R MOD SED  08/21/2017  . IR ANGIO VERTEBRAL SEL SUBCLAVIAN INNOMINATE UNI R MOD SED  08/21/2017  . IR PERCUTANEOUS ART THROMBECTOMY/INFUSION INTRACRANIAL INC DIAG ANGIO  08/21/2017  . IR RADIOLOGIST EVAL & MGMT  10/30/2017  . LOOP RECORDER INSERTION N/A 08/28/2017   Procedure: LOOP RECORDER INSERTION;  Surgeon: Constance Haw, MD;  Location: [facility];  Service: Cardiovascular;  Laterality: N/A;  . RADIOLOGY WITH ANESTHESIA N/A 08/21/2017   Procedure: RADIOLOGY WITH ANESTHESIA;  Surgeon: Luanne Bras, MD;  Location: [facility];  Service: Radiology;  Laterality: N/A;  . RIGHT/LEFT HEART CATH AND CORONARY ANGIOGRAPHY N/A 11/20/2017   Procedure: RIGHT/LEFT HEART CATH AND CORONARY ANGIOGRAPHY;  Surgeon: Jettie Booze, MD;  Location: [facility];  Service: Cardiovascular;  Laterality: N/A;  . TEE WITHOUT CARDIOVERSION N/A 08/28/2017   Procedure: TRANSESOPHAGEAL ECHOCARDIOGRAM (TEE);  Surgeon: Fay Records, MD;  Location: [facility];  Service: Cardiovascular;  Laterality: N/A;  . TEE WITHOUT CARDIOVERSION N/A 11/23/2017   Procedure: TRANSESOPHAGEAL ECHOCARDIOGRAM (TEE);  Surgeon: Gaye Pollack, MD;  Location: [facility];  Service: Open Heart Surgery;  Laterality: N/A;  . TUBAL LIGATION      There were no vitals filed for this  visit.  Subjective Assessment - 03/08/19 1625    Subjective  pt states she has not ordered air cast yet but plans to do so    Currently in Pain?  No/denies    Pain Onset  More than a month ago                       [facility] Adult PT Treatment/Exercise - 03/08/19 0001      Ambulation/Gait   Ambulation/Gait  Yes    Ambulation/Gait Assistance  5: Supervision    Ambulation Distance (Feet)  115 Feet    Assistive device  Small based quad cane    Gait Pattern  Step-through pattern;Decreased arm swing - right;Decreased stride  length;Decreased arm swing - left;Antalgic    Ambulation Surface  Level;Indoor      Knee/Hip Exercises: Stretches   Active Hamstring Stretch  Right;1 rep;30 seconds    Gastroc Stretch  Right;1 rep;30 seconds      Knee/Hip Exercises: Seated   Hamstring Curl  Strengthening;Right;1 set;10 reps   red band     Knee/Hip Exercises: Supine   Bridges  AROM;1 set;10 reps    Single Leg Bridge  AROM;Right;1 set;10 reps    Straight Leg Raises  Right;1 set;10 reps;Strengthening   3# weight   Other Supine Knee/Hip Exercises  Bridging with hip abduction/adduction, bridging with LLE extension 10 reps eac      Knee/Hip Exercises: Sidelying   Hip ABduction  Right;1 set;10 reps;Strengthening   3# weight used   Clams  10 reps RLE      Knee/Hip Exercises: Prone   Hamstring Curl  1 set;10 reps   RLE   Hip Extension  AROM;Right;1 set;10 reps      Ankle Exercises: Seated   Other Seated Ankle Exercises  seated Rt dorsiflexion with red theraband 10 reps 3 sec hold    Other Seated Ankle Exercises  seated Rt eversion 5 reps             PT Education - 03/08/19 1633    Education Details  added sidelying hip abduction - with circles clockwise and counter clockwise for trunk stabilization; added hamstring & heel cord stretch    Person(s) Educated  Patient    Methods  Explanation;Demonstration;Handout    Comprehension  Verbalized understanding;Returned demonstration       PT Short Term Goals - 03/08/19 1635      PT SHORT TERM GOAL #1   Title  Pt will verbalize understanding of initial HEP.     Time  4    Period  Weeks    Status  New    Target Date  03/29/19      PT SHORT TERM GOAL #2   Title  Pt is able to ambulate, carry on a conversation, and scan environment with no loss of balance     Status  Deferred      PT SHORT TERM GOAL #3   Title  Patient verbalizes understanding for recommendations for assistive device for safety & decrease stress for OA.     Status  Deferred      PT SHORT  TERM GOAL #4   Time  4        PT Long Term Goals - 03/08/19 1635      PT LONG TERM GOAL #1   Title  Patient verbalizes & demonstrates of ongoing HEP & community fitness program.    Time  8    Period  Weeks  Status  New      PT LONG TERM GOAL #2   Title  Improve difference between normal and cognitive TUG to be <10% to decrease falls risk    Status  Deferred      PT LONG TERM GOAL #3   Title  patient ambulates 1000' outdoors including grass modified Independent with LRAD and no loss of balance - REVISED to 500' with air cast on RLE with SBQC    Time  8    Period  Weeks    Status  Revised      PT LONG TERM GOAL #4   Title  Improve gait velocity to >2.62 ft/sec with LRAD to improve community mobility     Baseline  2.23 ft/sec on 3/11     Time  8    Period  Weeks    Status  New      PT LONG TERM GOAL #5   Title  Patient negotiates ramps, curbs & stairs with LRAD modified independent to enable community access.     Baseline  02/06/18 Pt/husband report pt is walking around the barn (~120 ft) over unlevel ground (deferred attempting 500 ft due to bil knee pain and pt anxious not to do anything to incr pain)    Time  8    Period  Weeks    Status  New      PT LONG TERM GOAL #6   Title  Patient verbalizes how to manage chronic OA knee pain including modifying activity level & assistive device.     Status  Deferred            Plan - 03/08/19 1636    Clinical Impression Statement  Pt is progressing well towards goals; would benefit from use of air cast for ankle stability, especially with amb. on uneven surfaces due to incr. tone with supination of Rt foot    Personal Factors and Comorbidities  Comorbidity 3+;Time since onset of injury/illness/exacerbation    Comorbidities  CVA 08/23/2017, CAD, NSTEMI, A-Fib, 11/23/2017 CABGx2,     Examination-Activity Limitations  Squat;Stand;Locomotion Level    Examination-Participation Restrictions  Shop;Community Activity    Rehab  Potential  Good    PT Frequency  Other (comment)    PT Duration  6 weeks    PT Treatment/Interventions  Gait training;Stair training;Functional mobility training;DME Instruction;Therapeutic exercise;Therapeutic activities;Balance training;Neuromuscular re-education;Patient/family education;ADLs/Self Care Home Management;Manual techniques;Moist Heat    PT Next Visit Plan  cont RLE strengthening    PT Home Exercise Plan  see pt instructions - added hip abdct. and hamstring stretches on 03-07-19    Recommended Other Services  OT    Consulted and Agree with Plan of Care  Patient       Patient will benefit from skilled therapeutic intervention in order to improve the following deficits and impairments:  Abnormal gait, Decreased range of motion, Impaired tone, Decreased balance, Decreased knowledge of use of DME, Impaired flexibility, Improper body mechanics, Decreased mobility, Postural dysfunction  Visit Diagnosis: Hemiplegia and hemiparesis following cerebral infarction affecting right dominant side (HCC)  Muscle weakness (generalized)  Other abnormalities of gait and mobility     Problem List Patient Active Problem List   Diagnosis Date Noted  . Anxiety 10/24/2018  . Numbness of left hand 10/19/2018  . Spastic hemiparesis affecting dominant side ([place]) 09/20/2018  . Primary osteoarthritis of both knees 07/04/2018  . Iron deficiency 04/23/2018  . Chronic urticaria 03/20/2018  . Atherosclerosis of aorta ([place]) 02/02/2018  .  Osteoarthritis of left knee 01/16/2018  . Osteoarthritis of right knee 01/16/2018  . Sequela of cerebrovascular accident 01/16/2018  . Paroxysmal atrial fibrillation ([employer]) 12/19/2017  . Chronic combined systolic and diastolic CHF (congestive heart failure) ([place]) 12/16/2017  . Benign essential HTN   . Supplemental oxygen dependent   . 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 Coronary artery disease 11/23/2017  . NSTEMI (non-ST elevated myocardial infarction) ([place]) 11/16/2017   . Former smoker 10/13/2017  . 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 Cerebrovascular accident (CVA) due to occlusion of left middle cerebral artery ([place])   . Right hemiplegia ([place])   . Dysphagia, post-stroke   . Hyperlipidemia LDL goal <70     Alda Lea, PT 03/08/2019, 4:39 PM  [facility] [address] [place], [place], [address] Phone: [phone]   Fax:  [phone]  Name: [name] MRN: [id number] Date of Birth: [birth year]

## 2019-03-11 ENCOUNTER — Ambulatory Visit: Payer: BC Managed Care – PPO | Admitting: Occupational Therapy

## 2019-03-11 ENCOUNTER — Other Ambulatory Visit: Payer: Self-pay

## 2019-03-11 ENCOUNTER — Encounter: Payer: Self-pay | Admitting: Occupational Therapy

## 2019-03-11 DIAGNOSIS — M6281 Muscle weakness (generalized): Secondary | ICD-10-CM

## 2019-03-11 DIAGNOSIS — R2681 Unsteadiness on feet: Secondary | ICD-10-CM

## 2019-03-11 DIAGNOSIS — I69351 Hemiplegia and hemiparesis following cerebral infarction affecting right dominant side: Secondary | ICD-10-CM | POA: Diagnosis not present

## 2019-03-11 DIAGNOSIS — R278 Other lack of coordination: Secondary | ICD-10-CM

## 2019-03-11 DIAGNOSIS — R293 Abnormal posture: Secondary | ICD-10-CM | POA: Diagnosis not present

## 2019-03-11 DIAGNOSIS — R2689 Other abnormalities of gait and mobility: Secondary | ICD-10-CM | POA: Diagnosis not present

## 2019-03-11 NOTE — Therapy (Signed)
[facility] [address] [place], [place], [address] Phone: [phone]   Fax:  [phone]  Occupational Therapy Treatment  Patient Details  Name: [name] MRN: [id number] Date of Birth: 1961-02-18 Referring Provider (OT): Jamse Arn   Encounter Date: 03/11/2019  OT End of Session - 03/11/19 1349    Visit Number  7    Number of Visits  15    Date for OT Re-Evaluation  04/12/19    Authorization Type  BCBS - pt has 30 VL combined with PT; pt wishes to additional 10 visits for OT, 5 additional visits for PT and reserve 5 visits.     Authorization - Visit Number  7    Authorization - Number of Visits  15    OT Start Time  1220    OT Stop Time  1305    OT Time Calculation (min)  45 min    Activity Tolerance  Patient tolerated treatment well    Behavior During Therapy  WFL for tasks assessed/performed       Past Medical History:  Diagnosis Date  . Arthritis   . CAD (coronary artery disease)    a. s/p NSTEMI in 10/2017 with cath showing LM disease --> s/p CABG on 11/23/2017 with LIMA-LAD and SVG-OM.   [name] Hay fever   . Ischemic cardiomyopathy    a. EF 35-40% by echo in 10/2017  . NSTEMI (non-ST elevated myocardial infarction) ([place])   . PAF (paroxysmal atrial fibrillation) ([place])    Diagnosed by ILR  . Pneumonia 11/16/2017  . Stroke [facility]) 08/23/2017   Left MCA infarct status post TPA and thrombectomy    Past Surgical History:  Procedure Laterality Date  . CORONARY ARTERY BYPASS GRAFT N/A 11/23/2017   Procedure: CORONARY ARTERY BYPASS GRAFTING (CABG) x 2, ON PUMP, USING LEFT INTERNAL MAMMARY ARTERY AND RIGHT GREATER SAPHENOUS VEIN HARVESTED ENDOSCOPICALLY;  Surgeon: Gaye Pollack, MD;  Location: [facility];  Service: Open Heart Surgery;  Laterality: N/A;  . CRYOABLATION     cervical  . IR ANGIO INTRA EXTRACRAN SEL COM CAROTID INNOMINATE UNI R MOD SED  08/21/2017  . IR ANGIO VERTEBRAL SEL SUBCLAVIAN  INNOMINATE UNI R MOD SED  08/21/2017  . IR PERCUTANEOUS ART THROMBECTOMY/INFUSION INTRACRANIAL INC DIAG ANGIO  08/21/2017  . IR RADIOLOGIST EVAL & MGMT  10/30/2017  . LOOP RECORDER INSERTION N/A 08/28/2017   Procedure: LOOP RECORDER INSERTION;  Surgeon: Constance Haw, MD;  Location: [facility];  Service: Cardiovascular;  Laterality: N/A;  . RADIOLOGY WITH ANESTHESIA N/A 08/21/2017   Procedure: RADIOLOGY WITH ANESTHESIA;  Surgeon: Luanne Bras, MD;  Location: [facility];  Service: Radiology;  Laterality: N/A;  . RIGHT/LEFT HEART CATH AND CORONARY ANGIOGRAPHY N/A 11/20/2017   Procedure: RIGHT/LEFT HEART CATH AND CORONARY ANGIOGRAPHY;  Surgeon: Jettie Booze, MD;  Location: [facility];  Service: Cardiovascular;  Laterality: N/A;  . TEE WITHOUT CARDIOVERSION N/A 08/28/2017   Procedure: TRANSESOPHAGEAL ECHOCARDIOGRAM (TEE);  Surgeon: Fay Records, MD;  Location: [facility];  Service: Cardiovascular;  Laterality: N/A;  . TEE WITHOUT CARDIOVERSION N/A 11/23/2017   Procedure: TRANSESOPHAGEAL ECHOCARDIOGRAM (TEE);  Surgeon: Gaye Pollack, MD;  Location: [facility];  Service: Open Heart Surgery;  Laterality: N/A;  . TUBAL LIGATION      There were no vitals filed for this visit.  Subjective Assessment - 03/11/19 1224    Subjective   I am trying to do the bathing (with right  hand) and feed myself 25% with that hand.  My husband made me a PVC thing and I exercise in my recliner.    Pertinent History  L MCA CVA in 07/2017 s/p TPA, thrombectomy; PMHx: loop recorder, CAD, CABG 10/2017, MI, bil knee OA    Patient Stated Goals  wants to walk better without AD; wants to be able to use R arm better, to write; less pain in L UE     Currently in Pain?  Yes    Pain Score  5     Pain Location  Knee    Pain Orientation  Left;Right    Pain Descriptors / Indicators  Aching    Pain Type  Chronic pain    Pain Onset  More than a month ago    Pain Frequency  Constant    Aggravating Factors    standing    Pain Relieving Factors  rest    Effect of Pain on Daily Activities  limits standing / walking                   OT Treatments/Exercises (OP) - 03/11/19 0001      ADLs   ADL Comments  Patient able to clearly demonstrate awareness of goals as related to ADL.  Patient is using her right hand to bathe self partially, and to feed self with utensil 25% meal.      Neurological Re-education Exercises   Other Exercises 1  Neuromuscular reeducation to address RUE functioning.  Patient with overuse of pectoralis, and upper trap with attempts to flex humerus.  In supine, patient able to reduce overactivation.  Worked on resisted shoulder extension, then easing into shoulder flexion.  Patient then able to access glenohumeral joint movement.  Worked on controlling more than on e joint at a time - elbow flex/ext with wrist extension, flrearm rotation with wrist extension, shoulder flexion with elbow extension, etc.      Other Exercises 2  Weight bearing through right wrist to increase wrist extension.  Encouraged weight through heel of hand, with extended elbow, and weight shifting to open hand.  Patient with no discomfort during weight bearing.                 OT Short Term Goals - 03/07/19 1514      OT SHORT TERM GOAL #1   Title  Pt will be mod I with home activities program for RUE functional use - 03/14/2019    Status  On-going      OT SHORT TERM GOAL #2   Title  Pt will demonstrate ability to incorporate RUE into self feeding activities (LTG moved to STG)    Status  On-going      OT SHORT TERM GOAL #3   Title  Pt will demonstrate ability to use RUE as gross assist 25% of the time in basic self care (LTG moved to STG)    Status  On-going        OT Long Term Goals - 03/07/19 1514      OT LONG TERM GOAL #1   Title  Pt will be independent in updated HEP including water based program for RUE - 04/12/2019    Status  On-going      OT LONG TERM GOAL #2   Title  Pt  will self-report able to complete morning ADL routine in 45 min or less.     Baseline  currently reports takes approx 1hr to perform  Status  On-going      OT LONG TERM GOAL #3   Title  Pt will demonstrate ability to perform simple cold meal prep at mod independent.     Status  Deferred   per pt request     OT LONG TERM GOAL #4   Title  Pt will demonstrate ability to use RUE as gross assist 50% of the time within basic self care.     Status  On-going      OT LONG TERM GOAL #5   Title  Pt will demonstrate ability for bil low reach with 50* shoulder flexion during functional activities.     Status  On-going      OT LONG TERM GOAL #6   Title  Pt will demonstrate increased grip strength of 3lb or more in RUE to assist with bil reaching task.     Baseline  8lb     Status  On-going      OT LONG TERM GOAL #7   Title  Pt will verbalize understanding for RUE positionig in bed in preparation for returning to sleeping in bed vs recliner.     Status  Achieved      OT LONG TERM GOAL #8   Title  Pt will be able to write a simple sentence with RUE with min compensation and AE prn    Status  On-going      OT LONG TERM GOAL  #9   TITLE  Pt will demonstrate ability to incorporate RUE as gross assist during self feeding activities    Status  On-going            Plan - 03/11/19 1349    Clinical Impression Statement  Patient showing steady progress and increasing her use of RUE in functional situations    OT Frequency  2x / week    OT Duration  Other (comment)    OT Treatment/Interventions  Self-care/ADL training;Therapeutic exercise;Balance training;Fluidtherapy;Manual Therapy;Neuromuscular education;Patient/family education;Therapeutic activities;Passive range of motion;Energy conservation;DME and/or AE instruction;Functional Mobility Training;Splinting;Aquatic Therapy;Moist Heat    Plan  Check HEP and add as possible.  NMR for RUE/Trunk, low reach, aquatic therapy       Patient  will benefit from skilled therapeutic intervention in order to improve the following deficits and impairments:     Visit Diagnosis: Hemiplegia and hemiparesis following cerebral infarction affecting right dominant side (HCC)  Muscle weakness (generalized)  Abnormal posture  Unsteadiness on feet  Other lack of coordination    Problem List Patient Active Problem List   Diagnosis Date Noted  . Anxiety 10/24/2018  . Numbness of left hand 10/19/2018  . Spastic hemiparesis affecting dominant side ([place]) 09/20/2018  . Primary osteoarthritis of both knees 07/04/2018  . Iron deficiency 04/23/2018  . Chronic urticaria 03/20/2018  . Atherosclerosis of aorta ([place]) 02/02/2018  . Osteoarthritis of left knee 01/16/2018  . Osteoarthritis of right knee 01/16/2018  . Sequela of cerebrovascular accident 01/16/2018  . Paroxysmal atrial fibrillation ([place]) 12/19/2017  . Chronic combined systolic and diastolic CHF (congestive heart failure) ([place]) 12/16/2017  . Benign essential HTN   . Supplemental oxygen dependent   . 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 Coronary artery disease 11/23/2017  . NSTEMI (non-ST elevated myocardial infarction) ([place]) 11/16/2017  . Former smoker 10/13/2017  . 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 Cerebrovascular accident (CVA) due to occlusion of left middle cerebral artery ([place])   . Right hemiplegia ([place])   . Dysphagia, post-stroke   .  Hyperlipidemia LDL goal <70     Mariah Milling, OTR/L 03/11/2019, 1:52 PM  [facility] [address] [place], [place], [address] Phone: [phone]   Fax:  [phone]  Name: ALYSSAMAE KLINCK MRN: [id number] Date of Birth: 10-10-1960

## 2019-03-14 ENCOUNTER — Other Ambulatory Visit: Payer: Self-pay

## 2019-03-14 ENCOUNTER — Ambulatory Visit: Payer: BC Managed Care – PPO | Admitting: Physical Therapy

## 2019-03-14 ENCOUNTER — Ambulatory Visit: Payer: BC Managed Care – PPO | Admitting: Occupational Therapy

## 2019-03-14 ENCOUNTER — Encounter: Payer: Self-pay | Admitting: Occupational Therapy

## 2019-03-14 DIAGNOSIS — I69351 Hemiplegia and hemiparesis following cerebral infarction affecting right dominant side: Secondary | ICD-10-CM

## 2019-03-14 DIAGNOSIS — R2681 Unsteadiness on feet: Secondary | ICD-10-CM | POA: Diagnosis not present

## 2019-03-14 DIAGNOSIS — R2689 Other abnormalities of gait and mobility: Secondary | ICD-10-CM

## 2019-03-14 DIAGNOSIS — R278 Other lack of coordination: Secondary | ICD-10-CM | POA: Diagnosis not present

## 2019-03-14 DIAGNOSIS — M6281 Muscle weakness (generalized): Secondary | ICD-10-CM

## 2019-03-14 DIAGNOSIS — R293 Abnormal posture: Secondary | ICD-10-CM

## 2019-03-14 NOTE — Patient Instructions (Addendum)
Wrist stretch for right hand - DO THIS BEFORE YOU DO YOUR WRIST EXERCISE!!! Only do as many as I tell you - do not over do it.  1. Sit on a firm surface and place your right hand on the edge of what you are sitting on, with your palm facing down and your fingers hanging over from your knuckles on.    2. Use your left hand to hold your right hand onto the surface.  3. Then just lean forward slowly, HOLD FOR A COUNT OF 5 and then sit back up. Do 10 in the morning and 10 at night.   4. Follow this with your wrist exercises using the dowel.   We did some ironing today!  Sit at your table and put down a bath towel.  You do not need to do a super large area just rectangle similar to what we did in the clinic.  KEEP YOUR LEFT HAND ON YOUR CHEST! Make sure you are reaching with your arm, DO NOT BEND THE ELBOW AND LOCK UP THE ARM.  Just do a few minutes a couple times a day.

## 2019-03-14 NOTE — Therapy (Signed)
[facility] [address] [place], [place], [address] Phone: [phone]   Fax:  [phone]  Occupational Therapy Treatment  Patient Details  Name: [name] MRN: [id number] Date of Birth: 1961-06-09 Referring Provider (OT): Jamse Arn   Encounter Date: 03/14/2019  OT End of Session - 03/14/19 1314    Visit Number  8    Number of Visits  15    Date for OT Re-Evaluation  04/12/19    Authorization Type  BCBS - pt has 30 VL combined with PT; pt wishes to additional 10 visits for OT, 5 additional visits for PT and reserve 5 visits.     Authorization - Visit Number  8    Authorization - Number of Visits  15    OT Start Time  1101    OT Stop Time  1145    OT Time Calculation (min)  44 min    Activity Tolerance  Patient tolerated treatment well       Past Medical History:  Diagnosis Date  . Arthritis   . CAD (coronary artery disease)    a. s/p NSTEMI in 10/2017 with cath showing LM disease --> s/p CABG on 11/23/2017 with LIMA-LAD and SVG-OM.   [name] Hay fever   . Ischemic cardiomyopathy    a. EF 35-40% by echo in 10/2017  . NSTEMI (non-ST elevated myocardial infarction) ([place])   . PAF (paroxysmal atrial fibrillation) ([place])    Diagnosed by ILR  . Pneumonia 11/16/2017  . Stroke [facility]) 08/23/2017   Left MCA infarct status post TPA and thrombectomy    Past Surgical History:  Procedure Laterality Date  . CORONARY ARTERY BYPASS GRAFT N/A 11/23/2017   Procedure: CORONARY ARTERY BYPASS GRAFTING (CABG) x 2, ON PUMP, USING LEFT INTERNAL MAMMARY ARTERY AND RIGHT GREATER SAPHENOUS VEIN HARVESTED ENDOSCOPICALLY;  Surgeon: Gaye Pollack, MD;  Location: [facility];  Service: Open Heart Surgery;  Laterality: N/A;  . CRYOABLATION     cervical  . IR ANGIO INTRA EXTRACRAN SEL COM CAROTID INNOMINATE UNI R MOD SED  08/21/2017  . IR ANGIO VERTEBRAL SEL SUBCLAVIAN INNOMINATE UNI R MOD SED  08/21/2017  . IR PERCUTANEOUS ART  THROMBECTOMY/INFUSION INTRACRANIAL INC DIAG ANGIO  08/21/2017  . IR RADIOLOGIST EVAL & MGMT  10/30/2017  . LOOP RECORDER INSERTION N/A 08/28/2017   Procedure: LOOP RECORDER INSERTION;  Surgeon: Constance Haw, MD;  Location: [facility] CV [facility];  Service: Cardiovascular;  Laterality: N/A;  . RADIOLOGY WITH ANESTHESIA N/A 08/21/2017   Procedure: RADIOLOGY WITH ANESTHESIA;  Surgeon: Luanne Bras, MD;  Location: [facility];  Service: Radiology;  Laterality: N/A;  . RIGHT/LEFT HEART CATH AND CORONARY ANGIOGRAPHY N/A 11/20/2017   Procedure: RIGHT/LEFT HEART CATH AND CORONARY ANGIOGRAPHY;  Surgeon: Jettie Booze, MD;  Location: [facility];  Service: Cardiovascular;  Laterality: N/A;  . TEE WITHOUT CARDIOVERSION N/A 08/28/2017   Procedure: TRANSESOPHAGEAL ECHOCARDIOGRAM (TEE);  Surgeon: Fay Records, MD;  Location: [facility];  Service: Cardiovascular;  Laterality: N/A;  . TEE WITHOUT CARDIOVERSION N/A 11/23/2017   Procedure: TRANSESOPHAGEAL ECHOCARDIOGRAM (TEE);  Surgeon: Gaye Pollack, MD;  Location: [facility];  Service: Open Heart Surgery;  Laterality: N/A;  . TUBAL LIGATION      There were no vitals filed for this visit.  Subjective Assessment - 03/14/19 1102    Subjective   My left knee has been "going out" on me - I don't think PT is making it worse tho  Pertinent History  L MCA CVA in 07/2017 s/p TPA, thrombectomy; PMHx: loop recorder, CAD, CABG 10/2017, MI, bil knee OA    Patient Stated Goals  wants to walk better without AD; wants to be able to use R arm better, to write; less pain in L UE     Currently in Pain?  Yes    Pain Score  6    R = 4/10, L = 6/10   Pain Location  Knee    Pain Orientation  Left;Right    Pain Descriptors / Indicators  Aching;Dull;Sore    Pain Type  Chronic pain    Pain Onset  More than a month ago    Pain Frequency  Constant    Aggravating Factors   standing or walking too much    Pain Relieving Factors  rest                    OT Treatments/Exercises (OP) - 03/14/19 0001      Neurological Re-education Exercises   Other Exercises 1  Neuro re ed in sitting with closed chain unilateral low to mid reach using UE ranger with intermittent resistance throughout the arc of movement. Pt needs max cues to isolate out elbow extension with reach patterns.  Progressd to bilateral closed chain low reach to bringing items into the body and away from the body.  Also addressed heavy work with RUE for side sitting in weight bearing with body on arm movement while forcing stabilzation of R shoulder girdle and trunk.  Transitioned back into sitting and addressed unilateral open chain reach incorporating grasp and release. Progressed to using famiilar funtional task of closed chain using RUE for ironing - with activity pt demonstrates much more active hand and wrist and ability to make small adjustments with hand and wrist based on task demand. Pt also requiring less cuing to reach with hand and isolate use of elbow extension with reach.  Home exercise program upgraded to include this task at home as well as stretch for wrist extension. Pt able to return demonstrate - see pt instruction section for details.              OT Education - 03/14/19 1306    Education Details  upgraded HEP to include functional activity and wrist extension stretch    Person(s) Educated  Patient    Methods  Explanation;Demonstration;Handout    Comprehension  Verbalized understanding;Returned demonstration       OT Short Term Goals - 03/14/19 1306      OT SHORT TERM GOAL #1   Title  Pt will be mod I with home activities program for RUE functional use - 03/14/2019    Status  Achieved      OT SHORT TERM GOAL #2   Title  Pt will demonstrate ability to incorporate RUE into self feeding activities (LTG moved to STG)    Status  Achieved      OT SHORT TERM GOAL #3   Title  Pt will demonstrate ability to use RUE as gross assist 25%  of the time in basic self care (LTG moved to STG)    Status  On-going        OT Long Term Goals - 03/14/19 1307      OT LONG TERM GOAL #1   Title  Pt will be independent in updated HEP including water based program for RUE - 04/12/2019    Status  On-going      OT  LONG TERM GOAL #2   Title  Pt will self-report able to complete morning ADL routine in 45 min or less.     Baseline  currently reports takes approx 1hr to perform     Status  On-going      OT LONG TERM GOAL #3   Title  Pt will demonstrate ability to perform simple cold meal prep at mod independent.     Status  Deferred   per pt request     OT LONG TERM GOAL #4   Title  Pt will demonstrate ability to use RUE as gross assist 50% of the time within basic self care.     Status  On-going      OT LONG TERM GOAL #5   Title  Pt will demonstrate ability for bil low reach with 50* shoulder flexion during functional activities.     Status  On-going      OT LONG TERM GOAL #6   Title  Pt will demonstrate increased grip strength of 3lb or more in RUE to assist with bil reaching task.     Baseline  8lb     Status  On-going      OT LONG TERM GOAL #7   Title  Pt will verbalize understanding for RUE positionig in bed in preparation for returning to sleeping in bed vs recliner.     Status  Achieved      OT LONG TERM GOAL #8   Title  Pt will be able to write a simple sentence with RUE with min compensation and AE prn    Status  On-going      OT LONG TERM GOAL  #9   TITLE  Pt will demonstrate ability to incorporate RUE as gross assist during self feeding activities    Status  On-going            Plan - 03/14/19 1307    Clinical Impression Statement  Pt continues to progress toward goals. Continue to encourage pt to use RUE functionally at home as much as possible.    OT Occupational Profile and History  Problem Focused Assessment - Including review of records relating to presenting problem    Occupational Profile and  client history currently impacting functional performance  wife, homemaker, enjoys flowers/gardening  PMH: L MCA CVA (07/2017) s/p TPA, thrombectomy, loop recorder, CAD, CABG, cardiac debility, MI, tobacco use, HTN, bil knee OA    Occupational performance deficits (Please refer to evaluation for details):  ADL's;Leisure;IADL's;Rest and Sleep;Social Participation    Body Structure / Function / Physical Skills  ADL;Balance;UE functional use;Strength;Mobility;Tone;IADL;GMC    Rehab Potential  Good    Clinical Decision Making  Limited treatment options, no task modification necessary    Comorbidities Affecting Occupational Performance:  May have comorbidities impacting occupational performance    OT Frequency  2x / week    OT Duration  Other (comment)   5 weeks   OT Treatment/Interventions  Self-care/ADL training;Therapeutic exercise;Balance training;Fluidtherapy;Manual Therapy;Neuromuscular education;Patient/family education;Therapeutic activities;Passive range of motion;Energy conservation;DME and/or AE instruction;Functional Mobility Training;Splinting;Aquatic Therapy;Moist Heat    Plan  Check HEP and add as possible.  NMR for RUE/Trunk, low reach, aquatic therapy    Consulted and Agree with Plan of Care  Patient       Patient will benefit from skilled therapeutic intervention in order to improve the following deficits and impairments:   Body Structure / Function / Physical Skills: ADL, Balance, UE functional use, Strength, Mobility, Tone, IADL, GMC  Visit Diagnosis: 1. 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 Muscle weakness (generalized)   3. Abnormal posture   4. Unsteadiness on feet   5. Other lack of coordination       Problem List Patient Active Problem List   Diagnosis Date Noted  . Anxiety 10/24/2018  . Numbness of left hand 10/19/2018  . Spastic hemiparesis affecting dominant side ([place]) 09/20/2018  . Primary  osteoarthritis of both knees 07/04/2018  . Iron deficiency 04/23/2018  . Chronic urticaria 03/20/2018  . Atherosclerosis of aorta ([place]) 02/02/2018  . Osteoarthritis of left knee 01/16/2018  . Osteoarthritis of right knee 01/16/2018  . Sequela of cerebrovascular accident 01/16/2018  . Paroxysmal atrial fibrillation ([place]) 12/19/2017  . Chronic combined systolic and diastolic CHF (congestive heart failure) ([place]) 12/16/2017  . Benign essential HTN   . Supplemental oxygen dependent   . 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 Coronary artery disease 11/23/2017  . NSTEMI (non-ST elevated myocardial infarction) ([place]) 11/16/2017  . Former smoker 10/13/2017  . 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 Cerebrovascular accident (CVA) due to occlusion of left middle cerebral artery ([place])   . Right hemiplegia ([place])   . Dysphagia, post-stroke   . Hyperlipidemia LDL goal <70     Quay Burow, OTR/L 03/14/2019, 1:16 PM  [facility] [address] [place], [place], [address] Phone: [phone]   Fax:  [phone]  Name: [name] MRN: [id number] Date of Birth: 1960/12/31

## 2019-03-15 ENCOUNTER — Encounter
Payer: BC Managed Care – PPO | Attending: Physical Medicine & Rehabilitation | Admitting: Physical Medicine & Rehabilitation

## 2019-03-15 ENCOUNTER — Encounter: Payer: Self-pay | Admitting: Physical Medicine & Rehabilitation

## 2019-03-15 VITALS — BP 116/76 | HR 75 | Temp 98.5°F | Ht 61.0 in | Wt 124.0 lb

## 2019-03-15 DIAGNOSIS — G8110 Spastic hemiplegia affecting unspecified side: Secondary | ICD-10-CM | POA: Diagnosis not present

## 2019-03-15 DIAGNOSIS — R269 Unspecified abnormalities of gait and mobility: Secondary | ICD-10-CM | POA: Diagnosis not present

## 2019-03-15 DIAGNOSIS — G5602 Carpal tunnel syndrome, left upper limb: Secondary | ICD-10-CM | POA: Diagnosis not present

## 2019-03-15 DIAGNOSIS — M170 Bilateral primary osteoarthritis of knee: Secondary | ICD-10-CM | POA: Insufficient documentation

## 2019-03-15 HISTORY — DX: Carpal tunnel syndrome, left upper limb: G56.02

## 2019-03-15 NOTE — Therapy (Signed)
[facility] [address] [place], [place], [address] Phone: [phone]   Fax:  [phone]  Physical Therapy Treatment  Patient Details  Name: [name] MRN: [id number] Date of Birth: 02-11-1961 Referring Provider (PT): Jamse Arn, MD  CLINIC OPERATION CHANGES: [facility]patient [facility] is open at lower capacity following universal masking, social distancing, and patient screening.  The patient's COVID risk of complications score is 3.  Encounter Date: 03/14/2019  PT End of Session - 03/15/19 1246    Visit Number  5    Number of Visits  8    Date for PT Re-Evaluation  04/30/19    Authorization Type  BCBS (D[employer] & Oop have been met. Pt has no financial responsibility). 30 visit limit for PT/OT combined.    Authorization - Visit Number  5    Authorization - Number of Visits  30    PT Start Time  1202    PT Stop Time  1250    PT Time Calculation (min)  48 min    Activity Tolerance  Patient limited by pain   c/o left knee pain in tall kneeling and quadruped position   Behavior During Therapy  WFL for tasks assessed/performed       Past Medical History:  Diagnosis Date  . Arthritis   . CAD (coronary artery disease)    a. s/p NSTEMI in 10/2017 with cath showing LM disease --> s/p CABG on 11/23/2017 with LIMA-LAD and SVG-OM.   [name] Hay fever   . Ischemic cardiomyopathy    a. EF 35-40% by echo in 10/2017  . NSTEMI (non-ST elevated myocardial infarction) ([place])   . PAF (paroxysmal atrial fibrillation) ([place])    Diagnosed by ILR  . Pneumonia 11/16/2017  . Stroke [facility]) 08/23/2017   Left MCA infarct status post TPA and thrombectomy    Past Surgical History:  Procedure Laterality Date  . CORONARY ARTERY BYPASS GRAFT N/A 11/23/2017   Procedure: CORONARY ARTERY BYPASS GRAFTING (CABG) x 2, ON PUMP, USING LEFT INTERNAL MAMMARY ARTERY AND RIGHT GREATER SAPHENOUS VEIN HARVESTED ENDOSCOPICALLY;  Surgeon: Gaye Pollack,  MD;  Location: [facility];  Service: Open Heart Surgery;  Laterality: N/A;  . CRYOABLATION     cervical  . IR ANGIO INTRA EXTRACRAN SEL COM CAROTID INNOMINATE UNI R MOD SED  08/21/2017  . IR ANGIO VERTEBRAL SEL SUBCLAVIAN INNOMINATE UNI R MOD SED  08/21/2017  . IR PERCUTANEOUS ART THROMBECTOMY/INFUSION INTRACRANIAL INC DIAG ANGIO  08/21/2017  . IR RADIOLOGIST EVAL & MGMT  10/30/2017  . LOOP RECORDER INSERTION N/A 08/28/2017   Procedure: LOOP RECORDER INSERTION;  Surgeon: Constance Haw, MD;  Location: [facility];  Service: Cardiovascular;  Laterality: N/A;  . RADIOLOGY WITH ANESTHESIA N/A 08/21/2017   Procedure: RADIOLOGY WITH ANESTHESIA;  Surgeon: Luanne Bras, MD;  Location: [facility];  Service: Radiology;  Laterality: N/A;  . RIGHT/LEFT HEART CATH AND CORONARY ANGIOGRAPHY N/A 11/20/2017   Procedure: RIGHT/LEFT HEART CATH AND CORONARY ANGIOGRAPHY;  Surgeon: Jettie Booze, MD;  Location: [facility];  Service: Cardiovascular;  Laterality: N/A;  . TEE WITHOUT CARDIOVERSION N/A 08/28/2017   Procedure: TRANSESOPHAGEAL ECHOCARDIOGRAM (TEE);  Surgeon: Fay Records, MD;  Location: [facility];  Service: Cardiovascular;  Laterality: N/A;  . TEE WITHOUT CARDIOVERSION N/A 11/23/2017   Procedure: TRANSESOPHAGEAL ECHOCARDIOGRAM (TEE);  Surgeon: Gaye Pollack, MD;  Location: [facility];  Service: Open Heart Surgery;  Laterality: N/A;  . TUBAL LIGATION  There were no vitals filed for this visit.  Subjective Assessment - 03/15/19 1237    Subjective  Pt reports no new problems - has ordered air cast for RLE    Patient Stated Goals  walk without knee issues, decrease knee pain with activity, improve posture and body mechanics during gait; per husband - get back to dancing     Currently in Pain?  Yes    Pain Score  6    Rt knee 4/10;  Lt knee 6/10   Pain Location  Knee    Pain Orientation  Left;Right    Pain Descriptors / Indicators  Aching;Dull;Sore    Pain Onset  More than a  month ago    Pain Frequency  Constant    Aggravating Factors   standing or walking too much    Pain Relieving Factors  rest    Effect of Pain on Daily Activities  limits walking & standing    Multiple Pain Sites  No                       [facility] Adult PT Treatment/Exercise - 03/15/19 0001      Transfers   Transfers  Sit to Stand    Sit to Stand  5: Supervision    Number of Reps  Other reps (comment)   5   Comments  no UE support       Ambulation/Gait   Ambulation/Gait  Yes    Ambulation/Gait Assistance  5: Supervision    Ambulation/Gait Assistance Details  cues for increased Rt knee extension in stance    Ambulation Distance (Feet)  115 Feet    Assistive device  None    Gait Pattern  Step-through pattern;Decreased arm swing - right;Decreased stride length;Decreased arm swing - left;Antalgic    Ambulation Surface  Level;Indoor    Gait Comments  Rt foot supinates due to increased tone      Knee/Hip Exercises: Stretches   Active Hamstring Stretch  Right;1 rep;30 seconds    Gastroc Stretch  Right;1 rep;30 seconds      Knee/Hip Exercises: Standing   Heel Raises  Both;1 set;10 reps    Forward Step Up  Right;1 set;10 reps;Hand Hold: 1;Step Height: 6"      NeuroRe-ed:  Pt transferred to floor with UE support on mat with CGA Tall kneeling on floor with UE support on mat table in front of her   Pt performed weight shifts laterally; moved LLE up/back for increased weight bearing on RLE Amb. On knees in tall kneeling position 2 reps forwards/backwards on mat on floor  Rt hip extension in tall kneeling position 10 reps with assistance Rt hip extended - Rt knee flexion/extension with mod assist to keep Rt leg lifted - 10 reps  Rt 1/2 kneeling position with mod to min assist for balance (pt fearful of falling in this position)  Pt in sitting position on mat - bil. Hands on green physioball in front - rolled ball forward and then lifted hips up off  Mat table for LE  strengthening and to facilitate Rt wrist extension in closed chain      PT Short Term Goals - 03/15/19 1253      PT SHORT TERM GOAL #1   Title  Pt will verbalize understanding of initial HEP.     Time  4    Period  Weeks    Status  New    Target Date  03/29/19  PT SHORT TERM GOAL #2   Title  Pt is able to ambulate, carry on a conversation, and scan environment with no loss of balance     Status  Deferred      PT SHORT TERM GOAL #3   Title  Patient verbalizes understanding for recommendations for assistive device for safety & decrease stress for OA.     Status  Deferred      PT SHORT TERM GOAL #4   Time  4        PT Long Term Goals - 03/15/19 1253      PT LONG TERM GOAL #1   Title  Patient verbalizes & demonstrates of ongoing HEP & community fitness program.    Time  8    Period  Weeks    Status  New      PT LONG TERM GOAL #2   Title  Improve difference between normal and cognitive TUG to be <10% to decrease falls risk    Status  Deferred      PT LONG TERM GOAL #3   Title  patient ambulates 1000' outdoors including grass modified Independent with LRAD and no loss of balance - REVISED to 500' with air cast on RLE with SBQC    Time  8    Period  Weeks    Status  Revised      PT LONG TERM GOAL #4   Title  Improve gait velocity to >2.62 ft/sec with LRAD to improve community mobility     Baseline  2.23 ft/sec on 3/11     Time  8    Period  Weeks    Status  New      PT LONG TERM GOAL #5   Title  Patient negotiates ramps, curbs & stairs with LRAD modified independent to enable community access.     Baseline  02/06/18 Pt/husband report pt is walking around the barn (~120 ft) over unlevel ground (deferred attempting 500 ft due to bil knee pain and pt anxious not to do anything to incr pain)    Time  8    Period  Weeks    Status  New      PT LONG TERM GOAL #6   Title  Patient verbalizes how to manage chronic OA knee pain including modifying activity level &  assistive device.     Status  Deferred            Plan - 03/15/19 1247    Clinical Impression Statement  Pt had moderate difficulty performing exercises in tall kneeling and quadruped position; pt c/o left knee pain after approx. 10" in various positions on red mat on floor - pt had much difficulty maintaining balance in Rt 1/2 kneeling and was fearful of falling, although balance was maintained with PT's assistance.  Pt's gait is safe enough on flat even surfaces that [facility] is not needed - informed pt that she did not need to bring to PT unless she thought it may be needed at end of session due to fatigue.    Personal Factors and Comorbidities  Comorbidity 3+;Time since onset of injury/illness/exacerbation    Comorbidities  CVA 08/23/2017, CAD, NSTEMI, A-Fib, 11/23/2017 CABGx2,     Examination-Activity Limitations  Squat;Stand;Locomotion Level    Examination-Participation Restrictions  Shop;Community Activity    Rehab Potential  Good    PT Frequency  Other (comment)    PT Duration  6 weeks    PT Treatment/Interventions  Gait training;Stair training;Functional  mobility training;DME Instruction;Therapeutic exercise;Therapeutic activities;Balance training;Neuromuscular re-education;Patient/family education;ADLs/Self Care Home Management;Manual techniques;Moist Heat    PT Next Visit Plan  cont RLE strengthening and balance training - air cast has been ordered per pt report; encourage Rt knee extension in stance as pt keeps knee slightly flexed    PT Home Exercise Plan  see pt instructions - added hip abdct. and hamstring stretches on 03-07-19    Consulted and Agree with Plan of Care  Patient       Patient will benefit from skilled therapeutic intervention in order to improve the following deficits and impairments:  Abnormal gait, Decreased range of motion, Impaired tone, Decreased balance, Decreased knowledge of use of DME, Impaired flexibility, Improper body mechanics, Decreased mobility,  Postural dysfunction  Visit Diagnosis: 1. Muscle weakness (generalized)   2. Other abnormalities of gait and mobility   3. Unsteadiness on feet        Problem List Patient Active Problem List   Diagnosis Date Noted  . Anxiety 10/24/2018  . Numbness of left hand 10/19/2018  . Spastic hemiparesis affecting dominant side ([place]) 09/20/2018  . Primary osteoarthritis of both knees 07/04/2018  . Iron deficiency 04/23/2018  . Chronic urticaria 03/20/2018  . Atherosclerosis of aorta ([place]) 02/02/2018  . Osteoarthritis of left knee 01/16/2018  . Osteoarthritis of right knee 01/16/2018  . Sequela of cerebrovascular accident 01/16/2018  . Paroxysmal atrial fibrillation ([place]) 12/19/2017  . Chronic combined systolic and diastolic CHF (congestive heart failure) ([place]) 12/16/2017  . Benign essential HTN   . Supplemental oxygen dependent   . 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 Coronary artery disease 11/23/2017  . NSTEMI (non-ST elevated myocardial infarction) ([place]) 11/16/2017  . Former smoker 10/13/2017  . 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 Cerebrovascular accident (CVA) due to occlusion of left middle cerebral artery ([place])   . Right hemiplegia ([place])   . Dysphagia, post-stroke   . Hyperlipidemia LDL goal <70     Alda Lea, PT 03/15/2019, 12:55 PM  [facility] [address] [place], [place], [address] Phone: [phone]   Fax:  [phone]  Name: [name] MRN: [id number] Date of Birth: 17-Jun-1961

## 2019-03-15 NOTE — Progress Notes (Signed)
[facility] Summary Report / Loop Recorder 

## 2019-03-15 NOTE — Progress Notes (Signed)
Subjective:    Patient ID: [name], female    DOB: 05-28-1961, 58 y.o.   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    Last clinic visit 02/21/2019. She had b/l knee injections at that time.  Since that time, she states she did well with the steroid injection, but not as well as Zilretta.  She was doing well until she was with therapies yesterday and "walking" on her knees and now it hurts more. She continues to have a rash. She is back in therapies. She is taking Baclofen with some benefit.  She is not using AFO at present, air cast was recommended by therapies. Voltaren gel helps with knee pain.  Denies falls.   Pain Inventory Average Pain 5 Pain Right Now 5 My pain is aching  In the last 24 hours, has pain interfered with the following? General activity 0 Relation with others 0 Enjoyment of life 0 What TIME of day is your pain at its worst? evening Sleep (in general) Good  Pain is worse with: standing Pain improves with: medication and injections Relief from Meds: no pain meds  Mobility walk with assistance use a cane ability to climb steps?  yes do you drive?  yes Do you have any goals in this area?  no  Function retired I need assistance with the following:  meal prep, household duties and shopping  Neuro/Psych trouble walking  Prior Studies Any changes since last visit?  no  Physicians involved in your care Any changes since last visit?  no   Family History  Problem Relation Age of Onset  . Heart attack Father   . Hypertension Mother   . Hyperlipidemia Mother   . Diabetes type II Mother   . Hypertension Brother   . Breast cancer Cousin 17   Social History   Socioeconomic History  . Marital status: Married    Spouse name: Not on file  . Number of  children: 2  . Years of education: Not on file  . Highest education level: Not on file  Occupational History  . Not on file  Social Needs  . Financial resource strain: Not on file  . Food insecurity    Worry: Not on file    Inability: Not on file  . Transportation needs    Medical: Not on file    Non-medical: Not on file  Tobacco Use  . Smoking status: Former Smoker    Packs/day: 0.12    Years: 37.00    Pack years: 4.44    Types: Cigarettes    Quit date: 08/21/2017    Years since quitting: 1.5  . Smokeless tobacco: Never Used  Substance and Sexual Activity  . Alcohol use: No    Frequency: Never  . Drug use: No  . Sexual activity: Not on file  Lifestyle  . Physical activity    Days per week: Not on file    Minutes per session: Not on file  . Stress: Not on file  Relationships  . Social Herbalist on phone: Patient refused    Gets together: Patient refused    Attends religious service: Patient refused    Active member of club or organization: Patient refused    Attends meetings of clubs or organizations: Patient refused    Relationship status:  Patient refused  Other Topics Concern  . Not on file  Social History Narrative   Married. 2 children.    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    Walks with cane or wheelchair.    Former smoker.    Drinks caffeine.    Smoke alarm in the home.   Firearms in the home.    Wears her seatbelt.    Feels safe In her relationships.    Past Surgical History:  Procedure Laterality Date  . CORONARY ARTERY BYPASS GRAFT N/A 11/23/2017   Procedure: CORONARY ARTERY BYPASS GRAFTING (CABG) x 2, ON PUMP, USING LEFT INTERNAL MAMMARY ARTERY AND RIGHT GREATER SAPHENOUS VEIN HARVESTED ENDOSCOPICALLY;  Surgeon: Gaye Pollack, MD;  Location: [facility];  Service: Open Heart Surgery;  Laterality: N/A;  . CRYOABLATION     cervical  . IR ANGIO INTRA EXTRACRAN SEL COM CAROTID INNOMINATE UNI R MOD SED  08/21/2017  . IR ANGIO VERTEBRAL SEL  SUBCLAVIAN INNOMINATE UNI R MOD SED  08/21/2017  . IR PERCUTANEOUS ART THROMBECTOMY/INFUSION INTRACRANIAL INC DIAG ANGIO  08/21/2017  . IR RADIOLOGIST EVAL & MGMT  10/30/2017  . LOOP RECORDER INSERTION N/A 08/28/2017   Procedure: LOOP RECORDER INSERTION;  Surgeon: Constance Haw, MD;  Location: [place] [facility];  Service: Cardiovascular;  Laterality: N/A;  . RADIOLOGY WITH ANESTHESIA N/A 08/21/2017   Procedure: RADIOLOGY WITH ANESTHESIA;  Surgeon: Luanne Bras, MD;  Location: [facility];  Service: Radiology;  Laterality: N/A;  . RIGHT/LEFT HEART CATH AND CORONARY ANGIOGRAPHY N/A 11/20/2017   Procedure: RIGHT/LEFT HEART CATH AND CORONARY ANGIOGRAPHY;  Surgeon: Jettie Booze, MD;  Location: [facility];  Service: Cardiovascular;  Laterality: N/A;  . TEE WITHOUT CARDIOVERSION N/A 08/28/2017   Procedure: TRANSESOPHAGEAL ECHOCARDIOGRAM (TEE);  Surgeon: Fay Records, MD;  Location: [facility];  Service: Cardiovascular;  Laterality: N/A;  . TEE WITHOUT CARDIOVERSION N/A 11/23/2017   Procedure: TRANSESOPHAGEAL ECHOCARDIOGRAM (TEE);  Surgeon: Gaye Pollack, MD;  Location: [facility];  Service: Open Heart Surgery;  Laterality: N/A;  . TUBAL LIGATION     Past Medical History:  Diagnosis Date  . Arthritis   . CAD (coronary artery disease)    a. s/p NSTEMI in 10/2017 with cath showing LM disease --> s/p CABG on 11/23/2017 with LIMA-LAD and SVG-OM.   [name] Hay fever   . Ischemic cardiomyopathy    a. EF 35-40% by echo in 10/2017  . NSTEMI (non-ST elevated myocardial infarction) ([place])   . PAF (paroxysmal atrial fibrillation) ([place])    Diagnosed by ILR  . Pneumonia 11/16/2017  . Stroke ([place]) 08/23/2017   Left MCA infarct status post TPA and thrombectomy   BP 116/76   Pulse 75   Temp 98.5 F (36.9 C)   Ht 5\' 1"  (1.549 m)   Wt 124 lb (56.2 kg)   LMP  (LMP Unknown)   SpO2 97%   BMI 23.43 kg/m   Opioid Risk Score:   Fall Risk Score:  `1  Depression screen PHQ 2/9  Depression  screen [facility] 2/9 11/22/2018 10/22/2018 10/22/2018 10/19/2018 11/10/2017 11/10/2017 10/13/2017  Decreased Interest 0 0 0 0 0 0 0  Down, Depressed, Hopeless 0 0 0 0 0 0 0  PHQ - 2 Score 0 0 0 0 0 0 0  Altered sleeping - 0 - - 0 - -  Tired, decreased energy - 1 - - 0 - -  Change in appetite - 0 - - 0 - -  Feeling bad or failure  about yourself  - 0 - - 0 - -  Trouble concentrating - 0 - - 0 - -  Moving slowly or fidgety/restless - 0 - - 0 - -  Suicidal thoughts - 0 - - 0 - -  PHQ-9 Score - 1 - - 0 - -  Difficult doing work/chores - Not difficult at all - - Not difficult at all - -  Some recent data might be hidden    Review of Systems  HENT: Negative.   Eyes: Negative.   Respiratory: Negative.   Gastrointestinal: Negative.   Endocrine: Negative.   Genitourinary: Negative.   Musculoskeletal: Positive for arthralgias, gait problem and myalgias.  Skin: Positive for rash.  Allergic/Immunologic: Negative.   Neurological: Positive for weakness and numbness.  Hematological: Negative.   Psychiatric/Behavioral: Negative.   All other systems reviewed and are negative.     Objective:   Physical Exam Constitutional: She appears well-developed and well-nourished.  HENT: Atraumatic. Normocephalic. Eyes: EOMI. No discharge.  Cardiovascular: No JVD   Respiratory: Normal effort.   GI: Non-distended Musculoskeletal: Mild left knee edema. Gait: Hemiplegic gait Neurological: She is alert and oriented.  Motor: Right upper extremity: 3+/5 shoulder abduction, 4-/5 elbow flex/ext, 4-/5 hand grip  Right lower extremity: Hip flexion 4+/5, knee extension 4+/5, ADF/PF 4+/5  MAS: elbow flexors 1/4, distally 0/4 Skin: Diffuse rash.  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  1. 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 Status post loop recorder  Cont HEP  Patient states more discomfort/irration PRAFO/WHO/AFO  Cont Fluoxetine  She followed up with Vascular, no further intervention at present  Cont therapies   2. Spastic hemiplegia  Cont Baclofen to 10 TID (typically takes BID), good benefit  Will hold off on Botox at this time  3. Neurologic gait abnormality  With foot drag, improved with cognizant ambulation  Cont HEP  Cont quad cane  Encouraged use of AFO  Cont therapies  4. Bilateral knee OA  B/l Zilretta injections performed on 03/23/18 - with good benefit  Encouraged trial Lidoderm patch, reminded againx3  Xrays reviewed, medial > lateral OA b/l  Synvisc b/l on 10/05/2018 with benefit  Steroid injection on 5/28 with benefit (did not use Zilretta due to rash)  Cont Voltaren gel  5. Left CTS  NCS/EMG showing L>R CTS  Will order bracing   Will consider Gabapentin  6. Rash  Encouraged follow up with PCP

## 2019-03-18 ENCOUNTER — Ambulatory Visit: Payer: BC Managed Care – PPO | Admitting: Occupational Therapy

## 2019-03-18 ENCOUNTER — Ambulatory Visit: Payer: BC Managed Care – PPO | Admitting: Physical Therapy

## 2019-03-18 ENCOUNTER — Other Ambulatory Visit: Payer: Self-pay

## 2019-03-18 ENCOUNTER — Encounter: Payer: Self-pay | Admitting: Occupational Therapy

## 2019-03-18 DIAGNOSIS — R293 Abnormal posture: Secondary | ICD-10-CM | POA: Diagnosis not present

## 2019-03-18 DIAGNOSIS — R2681 Unsteadiness on feet: Secondary | ICD-10-CM | POA: Diagnosis not present

## 2019-03-18 DIAGNOSIS — R2689 Other abnormalities of gait and mobility: Secondary | ICD-10-CM | POA: Diagnosis not present

## 2019-03-18 DIAGNOSIS — I69351 Hemiplegia and hemiparesis following cerebral infarction affecting right dominant side: Secondary | ICD-10-CM

## 2019-03-18 DIAGNOSIS — R278 Other lack of coordination: Secondary | ICD-10-CM

## 2019-03-18 DIAGNOSIS — M6281 Muscle weakness (generalized): Secondary | ICD-10-CM

## 2019-03-18 NOTE — Therapy (Signed)
[facility] [address] [place], [place], [address] Phone: [phone]   Fax:  [phone]  Occupational Therapy Treatment  Patient Details  Name: [name] MRN: [id number] Date of Birth: Mar 05, 1961 Referring Provider (OT): Jamse Arn   Encounter Date: 03/18/2019  OT End of Session - 03/18/19 1426    Visit Number  9    Number of Visits  15    Date for OT Re-Evaluation  04/12/19    Authorization Type  [employer] - pt has 30 VL combined with PT; pt wishes to additional 10 visits for OT, 5 additional visits for PT and reserve 5 visits.     Authorization - Visit Number  9    Authorization - Number of Visits  15    OT Start Time  1155    OT Stop Time  1246    OT Time Calculation (min)  51 min    Activity Tolerance  Patient tolerated treatment well       Past Medical History:  Diagnosis Date  . Arthritis   . CAD (coronary artery disease)    a. s/p NSTEMI in 10/2017 with cath showing LM disease --> s/p CABG on 11/23/2017 with LIMA-LAD and SVG-OM.   [name] Hay fever   . Ischemic cardiomyopathy    a. EF 35-40% by echo in 10/2017  . NSTEMI (non-ST elevated myocardial infarction) ([place])   . PAF (paroxysmal atrial fibrillation) ([place])    Diagnosed by ILR  . Pneumonia 11/16/2017  . Stroke [facility]) 08/23/2017   Left MCA infarct status post TPA and thrombectomy    Past Surgical History:  Procedure Laterality Date  . CORONARY ARTERY BYPASS GRAFT N/A 11/23/2017   Procedure: CORONARY ARTERY BYPASS GRAFTING (CABG) x 2, ON PUMP, USING LEFT INTERNAL MAMMARY ARTERY AND RIGHT GREATER SAPHENOUS VEIN HARVESTED ENDOSCOPICALLY;  Surgeon: Gaye Pollack, MD;  Location: [facility];  Service: Open Heart Surgery;  Laterality: N/A;  . CRYOABLATION     cervical  . IR ANGIO INTRA EXTRACRAN SEL COM CAROTID INNOMINATE UNI R MOD SED  08/21/2017  . IR ANGIO VERTEBRAL SEL SUBCLAVIAN INNOMINATE UNI R MOD SED  08/21/2017  . IR PERCUTANEOUS ART  THROMBECTOMY/INFUSION INTRACRANIAL INC DIAG ANGIO  08/21/2017  . IR RADIOLOGIST EVAL & MGMT  10/30/2017  . LOOP RECORDER INSERTION N/A 08/28/2017   Procedure: LOOP RECORDER INSERTION;  Surgeon: Constance Haw, MD;  Location: [place] [facility];  Service: Cardiovascular;  Laterality: N/A;  . RADIOLOGY WITH ANESTHESIA N/A 08/21/2017   Procedure: RADIOLOGY WITH ANESTHESIA;  Surgeon: Luanne Bras, MD;  Location: [facility];  Service: Radiology;  Laterality: N/A;  . RIGHT/LEFT HEART CATH AND CORONARY ANGIOGRAPHY N/A 11/20/2017   Procedure: RIGHT/LEFT HEART CATH AND CORONARY ANGIOGRAPHY;  Surgeon: Jettie Booze, MD;  Location: [facility] [facility];  Service: Cardiovascular;  Laterality: N/A;  . TEE WITHOUT CARDIOVERSION N/A 08/28/2017   Procedure: TRANSESOPHAGEAL ECHOCARDIOGRAM (TEE);  Surgeon: Fay Records, MD;  Location: [facility];  Service: Cardiovascular;  Laterality: N/A;  . TEE WITHOUT CARDIOVERSION N/A 11/23/2017   Procedure: TRANSESOPHAGEAL ECHOCARDIOGRAM (TEE);  Surgeon: Gaye Pollack, MD;  Location: [facility];  Service: Open Heart Surgery;  Laterality: N/A;  . TUBAL LIGATION      There were no vitals filed for this visit.  Subjective Assessment - 03/18/19 1423    Subjective   My left knee still hurts but the R knee is only about a 2/10 now (at then end of aquatic  therapy session)    Pertinent History  L MCA CVA in 07/2017 s/p TPA, thrombectomy; PMHx: loop recorder, CAD, CABG 10/2017, MI, bil knee OA    Patient Stated Goals  wants to walk better without AD; wants to be able to use R arm better, to write; less pain in L UE     Currently in Pain?  Yes    Pain Score  6    6 for L knee, 5 for right   Pain Location  Knee    Pain Orientation  Right;Left    Pain Type  Chronic pain    Pain Onset  More than a month ago    Pain Frequency  Constant    Aggravating Factors   standing or walking too much    Pain Relieving Factors  rest - "it hurts less in the water"    Multiple Pain Sites   No       Patient seen for aquatic therapy today.  Treatment took place in water 2.5-4 feet deep depending upon activity.  Pt entered the pool via steps using 2 railings (cue to use RUE on railing) and min assist/facilitaiton for postural alignment and control on steps.  This is pt's first aquatic session therefore reviewed basic principles of treatment in the water so that pt understands rationale as well as how to adjust her own home program (pt has her own pool).  Addressed postural alignment and control during functional ambulation using long dumb bell for light UE support with emphasis on full weight bearing on RLE in stance and cues for longer step length with LLE.  Pt also needs cues to decrease reliance on LUE for support to improve alignment and increase activation of RLE and trunk.  Addressed UE strengthening in standing using light dumb bells initially on top of the water (gliding) and then progressing to partial submergence for resistance for chest presses (10 reps x2), as well as horizontal ab/adduction.  Pt needs max cues to allow hand to guide reach vs using body or shoulder to "place" hand as well as cues for full elbow extension.  Also addressed elbow flexion/extension using light dumb bell with trunk supported on pool wall and working into extension first - focus on isolation of elbow extension with little movement at the trunk or shoulder (5 reps x3).  Addressed LLE strengthening for ab/adduction using side stepping with flotation device on LLE to increase resistance. Also addressed weight shifting into single leg stance onto RLE with back supported on pool and mod facilitation at UE's to assist with full weight shift.  Addressed functional ambulation in open water without UE support and min facilitation at pelvis for alignment and stability with emphasis on more gait speed. Pt exited the pool via steps using 2 hand rails to incorporate RUE into functional activity and min a for postural  alignment and control. Pt stated she has less pain in R knee by end of session.                       OT Short Term Goals - 03/18/19 1425      OT SHORT TERM GOAL #1   Title  Pt will be mod I with home activities program for RUE functional use - 03/14/2019    Status  Achieved      OT SHORT TERM GOAL #2   Title  Pt will demonstrate ability to incorporate RUE into self feeding activities (LTG moved to STG)  Status  Achieved      OT SHORT TERM GOAL #3   Title  Pt will demonstrate ability to use RUE as gross assist 25% of the time in basic self care (LTG moved to STG)    Status  Achieved        OT Long Term Goals - 03/18/19 1425      OT LONG TERM GOAL #1   Title  Pt will be independent in updated HEP including water based program for RUE - 04/12/2019    Status  On-going      OT LONG TERM GOAL #2   Title  Pt will self-report able to complete morning ADL routine in 45 min or less.     Baseline  currently reports takes approx 1hr to perform     Status  On-going      OT LONG TERM GOAL #3   Title  Pt will demonstrate ability to perform simple cold meal prep at mod independent.     Status  Deferred   per pt request     OT LONG TERM GOAL #4   Title  Pt will demonstrate ability to use RUE as gross assist 50% of the time within basic self care.     Status  On-going      OT LONG TERM GOAL #5   Title  Pt will demonstrate ability for bil low reach with 50* shoulder flexion during functional activities.     Status  On-going      OT LONG TERM GOAL #6   Title  Pt will demonstrate increased grip strength of 3lb or more in RUE to assist with bil reaching task.     Baseline  8lb     Status  On-going      OT LONG TERM GOAL #7   Title  Pt will verbalize understanding for RUE positionig in bed in preparation for returning to sleeping in bed vs recliner.     Status  Achieved      OT LONG TERM GOAL #8   Title  Pt will be able to write a simple sentence with RUE with min  compensation and AE prn    Status  On-going      OT LONG TERM GOAL  #9   TITLE  Pt will demonstrate ability to incorporate RUE as gross assist during self feeding activities    Status  On-going            Plan - 03/18/19 1425    Clinical Impression Statement  Pt progressing toward goals. Pt stated she has less pain today working "in the water" for aquatic therapy than she does on land    OT Occupational Profile and History  Problem Focused Assessment - Including review of records relating to presenting problem    Occupational Profile and client history currently impacting functional performance  wife, homemaker, enjoys flowers/gardening  PMH: L MCA CVA (07/2017) s/p TPA, thrombectomy, loop recorder, CAD, CABG, cardiac debility, MI, tobacco use, HTN, bil knee OA    Occupational performance deficits (Please refer to evaluation for details):  ADL's;Leisure;IADL's;Rest and Sleep;Social Participation    Body Structure / Function / Physical Skills  ADL;Balance;UE functional use;Strength;Mobility;Tone;IADL;GMC    Clinical Decision Making  Limited treatment options, no task modification necessary    Comorbidities Affecting Occupational Performance:  May have comorbidities impacting occupational performance    OT Frequency  2x / week    OT Duration  Other (comment)   5 weeks  OT Treatment/Interventions  Self-care/ADL training;Therapeutic exercise;Balance training;Fluidtherapy;Manual Therapy;Neuromuscular education;Patient/family education;Therapeutic activities;Passive range of motion;Energy conservation;DME and/or AE instruction;Functional Mobility Training;Splinting;Aquatic Therapy;Moist Heat    Plan  Check HEP and add as possible.  NMR for RUE/Trunk, low reach, aquatic therapy    Consulted and Agree with Plan of Care  Patient       Patient will benefit from skilled therapeutic intervention in order to improve the following deficits and impairments:   Body Structure / Function / Physical  Skills: ADL, Balance, UE functional use, Strength, Mobility, Tone, IADL, GMC       Visit Diagnosis: 1. Muscle weakness (generalized)   2. Unsteadiness on feet   3. Hemiplegia and hemiparesis following cerebral infarction affecting right dominant side ([place])   4. Abnormal posture   5. Other lack of coordination       Problem List Patient Active Problem List   Diagnosis Date Noted  . Carpal tunnel syndrome of left wrist 03/15/2019  . Anxiety 10/24/2018  . Numbness of left hand 10/19/2018  . Spastic hemiparesis affecting dominant side ([place]) 09/20/2018  . Primary osteoarthritis of both knees 07/04/2018  . Iron deficiency 04/23/2018  . Chronic urticaria 03/20/2018  . Atherosclerosis of aorta ([place]) 02/02/2018  . Osteoarthritis of left knee 01/16/2018  . Osteoarthritis of right knee 01/16/2018  . Sequela of cerebrovascular accident 01/16/2018  . Paroxysmal atrial fibrillation ([place]) 12/19/2017  . Chronic combined systolic and diastolic CHF (congestive heart failure) ([place]) 12/16/2017  . Benign essential HTN   . Supplemental oxygen dependent   . 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 Coronary artery disease 11/23/2017  . NSTEMI (non-ST elevated myocardial infarction) ([place]) 11/16/2017  . Former smoker 10/13/2017  . Left middle cerebral artery stroke ([place]) 08/28/2017  . Cerebrovascular accident (CVA) due to occlusion of left middle cerebral artery ([place])   . Right hemiplegia ([place])   . Dysphagia, post-stroke   . Hyperlipidemia LDL goal <70     Quay Burow, OTR/L 03/18/2019, 2:28 PM  [facility] [address] [place], [place], [address] Phone: [phone]   Fax:  [phone]  Name: [name] MRN: [id number] Date of Birth: [birth year]

## 2019-03-25 DIAGNOSIS — M25532 Pain in left wrist: Secondary | ICD-10-CM | POA: Diagnosis not present

## 2019-03-27 ENCOUNTER — Ambulatory Visit: Payer: BC Managed Care – PPO | Attending: Physical Medicine & Rehabilitation | Admitting: Occupational Therapy

## 2019-03-27 ENCOUNTER — Other Ambulatory Visit: Payer: Self-pay

## 2019-03-27 ENCOUNTER — Encounter: Payer: Self-pay | Admitting: Physical Therapy

## 2019-03-27 ENCOUNTER — Encounter: Payer: Self-pay | Admitting: Occupational Therapy

## 2019-03-27 ENCOUNTER — Ambulatory Visit: Payer: BC Managed Care – PPO | Admitting: Physical Therapy

## 2019-03-27 DIAGNOSIS — R278 Other lack of coordination: Secondary | ICD-10-CM | POA: Diagnosis not present

## 2019-03-27 DIAGNOSIS — R293 Abnormal posture: Secondary | ICD-10-CM

## 2019-03-27 DIAGNOSIS — R2689 Other abnormalities of gait and mobility: Secondary | ICD-10-CM | POA: Diagnosis not present

## 2019-03-27 DIAGNOSIS — M6281 Muscle weakness (generalized): Secondary | ICD-10-CM | POA: Insufficient documentation

## 2019-03-27 DIAGNOSIS — I69351 Hemiplegia and hemiparesis following cerebral infarction affecting right dominant side: Secondary | ICD-10-CM | POA: Insufficient documentation

## 2019-03-27 DIAGNOSIS — R2681 Unsteadiness on feet: Secondary | ICD-10-CM

## 2019-03-27 NOTE — Patient Instructions (Signed)
1. Grip Strengthening (Resistive Putty)  Yellow putty.  Squeeze and then use your left hand to make the putty fat again before squeezing again with your right hand. DO THIS EVERY SINGLE DAY!! Once in the morning and once at night.    Squeeze putty using thumb and all fingers. Repeat _15___ times. Do __2__ sessions per day.        Copyright  VHI. All rights reserved.

## 2019-03-27 NOTE — Therapy (Signed)
[facility] [address] [place], [place], [address] Phone: [phone]   Fax:  [phone]  Occupational Therapy Treatment  Patient Details  Name: [name] MRN: [id number] Date of Birth: Jul 20, 1961 Referring Provider (OT): Jamse Arn   Encounter Date: 03/27/2019  OT End of Session - 03/27/19 1804    Visit Number  10    Number of Visits  15    Date for OT Re-Evaluation  04/12/19    Authorization Type  BCBS - pt has 30 VL combined with PT; pt wishes to additional 10 visits for OT, 5 additional visits for PT and reserve 5 visits.     Authorization - Visit Number  10    Authorization - Number of Visits  15    OT Start Time  1701    OT Stop Time  0272    OT Time Calculation (min)  47 min    Activity Tolerance  Patient tolerated treatment well       Past Medical History:  Diagnosis Date  . Arthritis   . CAD (coronary artery disease)    a. s/p NSTEMI in 10/2017 with cath showing LM disease --> s/p CABG on 11/23/2017 with LIMA-LAD and SVG-OM.   [name] Hay fever   . Ischemic cardiomyopathy    a. EF 35-40% by echo in 10/2017  . NSTEMI (non-ST elevated myocardial infarction) ([place])   . PAF (paroxysmal atrial fibrillation) ([facility])    Diagnosed by ILR  . Pneumonia 11/16/2017  . Stroke [facility]) 08/23/2017   Left MCA infarct status post TPA and thrombectomy    Past Surgical History:  Procedure Laterality Date  . CORONARY ARTERY BYPASS GRAFT N/A 11/23/2017   Procedure: CORONARY ARTERY BYPASS GRAFTING (CABG) x 2, ON PUMP, USING LEFT INTERNAL MAMMARY ARTERY AND RIGHT GREATER SAPHENOUS VEIN HARVESTED ENDOSCOPICALLY;  Surgeon: Gaye Pollack, MD;  Location: [facility];  Service: Open Heart Surgery;  Laterality: N/A;  . CRYOABLATION     cervical  . IR ANGIO INTRA EXTRACRAN SEL COM CAROTID INNOMINATE UNI R MOD SED  08/21/2017  . IR ANGIO VERTEBRAL SEL SUBCLAVIAN INNOMINATE UNI R MOD SED  08/21/2017  . IR PERCUTANEOUS ART  THROMBECTOMY/INFUSION INTRACRANIAL INC DIAG ANGIO  08/21/2017  . IR RADIOLOGIST EVAL & MGMT  10/30/2017  . LOOP RECORDER INSERTION N/A 08/28/2017   Procedure: LOOP RECORDER INSERTION;  Surgeon: Constance Haw, MD;  Location: [facility];  Service: Cardiovascular;  Laterality: N/A;  . RADIOLOGY WITH ANESTHESIA N/A 08/21/2017   Procedure: RADIOLOGY WITH ANESTHESIA;  Surgeon: Luanne Bras, MD;  Location: [facility];  Service: Radiology;  Laterality: N/A;  . RIGHT/LEFT HEART CATH AND CORONARY ANGIOGRAPHY N/A 11/20/2017   Procedure: RIGHT/LEFT HEART CATH AND CORONARY ANGIOGRAPHY;  Surgeon: Jettie Booze, MD;  Location: [facility];  Service: Cardiovascular;  Laterality: N/A;  . TEE WITHOUT CARDIOVERSION N/A 08/28/2017   Procedure: TRANSESOPHAGEAL ECHOCARDIOGRAM (TEE);  Surgeon: Fay Records, MD;  Location: [facility];  Service: Cardiovascular;  Laterality: N/A;  . TEE WITHOUT CARDIOVERSION N/A 11/23/2017   Procedure: TRANSESOPHAGEAL ECHOCARDIOGRAM (TEE);  Surgeon: Gaye Pollack, MD;  Location: [facility];  Service: Open Heart Surgery;  Laterality: N/A;  . TUBAL LIGATION      There were no vitals filed for this visit.  Subjective Assessment - 03/27/19 1654    Subjective   My left hand is really bugging me - the dr gave me a brace for carpal tunnel  Pertinent History  L MCA CVA in 07/2017 s/p TPA, thrombectomy; PMHx: loop recorder, CAD, CABG 10/2017, MI, bil knee OA    Patient Stated Goals  wants to walk better without AD; wants to be able to use R arm better, to write; less pain in L UE     Currently in Pain?  Yes    Pain Score  8     Pain Location  Wrist    Pain Orientation  Left    Pain Descriptors / Indicators  Burning;Pins and needles    Pain Type  Chronic pain    Pain Onset  More than a month ago    Pain Frequency  Constant    Aggravating Factors   get worse when I use the hand, and worse in the morning    Pain Relieving Factors  I got a brace from the dr     Multiple Pain Sites  Yes    Pain Score  5    Pain Location  Knee    Pain Orientation  Right;Left    Pain Descriptors / Indicators  Sore    Pain Type  Chronic pain    Pain Onset  More than a month ago    Pain Frequency  Constant    Aggravating Factors   walking or standing too much    Pain Relieving Factors  rest, being in the water.                   OT Treatments/Exercises (OP) - 03/27/19 1753      ADLs   ADL Comments  Checked goals - see goals for updates.       Neurological Re-education Exercises   Other Exercises 1  Neuro re ed to address mid reach with RUE in weight bearing with moveable surface. Pt with signficant improvement in ability to isolate out elbow extension for functional reach (vs locking body and arm and moving body for forward reach).  Introduced intermittent resistance at end range with ability to hold against resistance.  Issued yellow putty to assist in development of functional grasp.  Pt able to return demonstrate after instruction and practice.        Splinting   Splinting  fabricated dorsal wrist cock up for RUE in order to allow pt more functional hand orientation (pt has functional grasp and release but has poor active wirst extension).  Will complete fabrication next session.              OT Education - 03/27/19 1759    Education Details  added yellow putty to address grip strength    Person(s) Educated  Patient    Methods  Explanation;Demonstration;Handout    Comprehension  Verbalized understanding;Returned demonstration       OT Short Term Goals - 03/27/19 1800      OT SHORT TERM GOAL #1   Title  Pt will be mod I with home activities program for RUE functional use - 03/14/2019    Status  Achieved      OT SHORT TERM GOAL #2   Title  Pt will demonstrate ability to incorporate RUE into self feeding activities (LTG moved to STG)    Status  Achieved      OT SHORT TERM GOAL #3   Title  Pt will demonstrate ability to use RUE as  gross assist 25% of the time in basic self care (LTG moved to STG)    Status  Achieved  OT Long Term Goals - 03/27/19 1800      OT LONG TERM GOAL #1   Title  Pt will be independent in updated HEP including water based program for RUE - 04/12/2019    Status  On-going      OT LONG TERM GOAL #2   Title  Pt will self-report able to complete morning ADL routine in 45 min or less.     Baseline  currently reports takes approx 1hr to perform     Status  Achieved      OT LONG TERM GOAL #3   Title  Pt will demonstrate ability to perform simple cold meal prep at mod independent.     Status  Deferred   per pt request     OT LONG TERM GOAL #4   Title  Pt will demonstrate ability to use RUE as gross assist 50% of the time within basic self care.     Status  Achieved      OT LONG TERM GOAL #5   Title  Pt will demonstrate ability for bil low reach with 50* shoulder flexion during functional activities.     Status  On-going      OT LONG TERM GOAL #6   Title  Pt will demonstrate increased grip strength of 3lb or more in RUE to assist with bil reaching task.     Baseline  8lb     Status  On-going      OT LONG TERM GOAL #7   Title  Pt will verbalize understanding for RUE positionig in bed in preparation for returning to sleeping in bed vs recliner.     Status  Achieved      OT LONG TERM GOAL #8   Title  Pt will be able to write a simple sentence with RUE with min compensation and AE prn    Status  On-going      OT LONG TERM GOAL  #9   TITLE  Pt will demonstrate ability to incorporate RUE as gross assist during self feeding activities    Status  Achieved            Plan - 03/27/19 1801    Clinical Impression Statement  Pt progressing toward goals. Pt reports she is using her RUE more at home functionally for assisted eating and in basic ADL    OT Occupational Profile and History  Problem Focused Assessment - Including review of records relating to presenting problem     Occupational Profile and client history currently impacting functional performance  wife, homemaker, enjoys flowers/gardening  PMH: L MCA CVA (07/2017) s/p TPA, thrombectomy, loop recorder, CAD, CABG, cardiac debility, MI, tobacco use, HTN, bil knee OA    Occupational performance deficits (Please refer to evaluation for details):  ADL's;Leisure;IADL's;Rest and Sleep;Social Participation    Body Structure / Function / Physical Skills  ADL;Balance;UE functional use;Strength;Mobility;Tone;IADL;GMC    Rehab Potential  Good    Clinical Decision Making  Limited treatment options, no task modification necessary    Comorbidities Affecting Occupational Performance:  May have comorbidities impacting occupational performance    OT Frequency  2x / week    OT Duration  Other (comment)   5 weeks   OT Treatment/Interventions  Self-care/ADL training;Therapeutic exercise;Balance training;Fluidtherapy;Manual Therapy;Neuromuscular education;Patient/family education;Therapeutic activities;Passive range of motion;Energy conservation;DME and/or AE instruction;Functional Mobility Training;Splinting;Aquatic Therapy;Moist Heat    Plan  complete splint, practice using splint functionally, NMR for RUE/Trunk, low reach/functional reach,  aquatic therapy  Consulted and Agree with Plan of Care  Patient       Patient will benefit from skilled therapeutic intervention in order to improve the following deficits and impairments:   Body Structure / Function / Physical Skills: ADL, Balance, UE functional use, Strength, Mobility, Tone, IADL, GMC       Visit Diagnosis: 1. Muscle weakness (generalized)   2. Unsteadiness on feet   3. Hemiplegia and hemiparesis following cerebral infarction affecting right dominant side ([facility])   4. Abnormal posture   5. Other lack of coordination       Problem List Patient Active Problem List   Diagnosis Date Noted  . Carpal tunnel syndrome of left wrist 03/15/2019  . Anxiety 10/24/2018   . Numbness of left hand 10/19/2018  . Spastic hemiparesis affecting dominant side ([facility]) 09/20/2018  . Primary osteoarthritis of both knees 07/04/2018  . Iron deficiency 04/23/2018  . Chronic urticaria 03/20/2018  . Atherosclerosis of aorta ([facility]) 02/02/2018  . Osteoarthritis of left knee 01/16/2018  . Osteoarthritis of right knee 01/16/2018  . Sequela of cerebrovascular accident 01/16/2018  . Paroxysmal atrial fibrillation ([place]) 12/19/2017  . Chronic combined systolic and diastolic CHF (congestive heart failure) ([facility]) 12/16/2017  . Benign essential HTN   . Supplemental oxygen dependent   . 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 Coronary artery disease 11/23/2017  . NSTEMI (non-ST elevated myocardial infarction) ([facility] [place]) 11/16/2017  . Former smoker 10/13/2017  . Left middle cerebral artery stroke ([place]) 08/28/2017  . Cerebrovascular accident (CVA) due to occlusion of left middle cerebral artery ([place])   . Right hemiplegia ([place])   . Dysphagia, post-stroke   . Hyperlipidemia LDL goal <70     Quay Burow, OTR/L 03/27/2019, 6:06 PM  [facility] [address] [place], [place], [address] Phone: [phone]   Fax:  [phone]  Name: [name] MRN: [id number] Date of Birth: [birth year]

## 2019-04-01 ENCOUNTER — Other Ambulatory Visit: Payer: Self-pay

## 2019-04-01 ENCOUNTER — Encounter: Payer: Self-pay | Admitting: Occupational Therapy

## 2019-04-01 ENCOUNTER — Other Ambulatory Visit: Payer: Self-pay | Admitting: Cardiology

## 2019-04-01 ENCOUNTER — Ambulatory Visit: Payer: BC Managed Care – PPO | Admitting: Occupational Therapy

## 2019-04-01 DIAGNOSIS — R293 Abnormal posture: Secondary | ICD-10-CM

## 2019-04-01 DIAGNOSIS — R278 Other lack of coordination: Secondary | ICD-10-CM | POA: Diagnosis not present

## 2019-04-01 DIAGNOSIS — I69351 Hemiplegia and hemiparesis following cerebral infarction affecting right dominant side: Secondary | ICD-10-CM

## 2019-04-01 DIAGNOSIS — M6281 Muscle weakness (generalized): Secondary | ICD-10-CM | POA: Diagnosis not present

## 2019-04-01 DIAGNOSIS — R2681 Unsteadiness on feet: Secondary | ICD-10-CM

## 2019-04-01 DIAGNOSIS — R2689 Other abnormalities of gait and mobility: Secondary | ICD-10-CM | POA: Diagnosis not present

## 2019-04-01 NOTE — Therapy (Signed)
[facility] [address] [place], [place], [address] Phone: [phone]   Fax:  [phone]  Occupational Therapy Treatment  Patient Details  Name: [name] MRN: [id number] Date of Birth: 07/23/1961 Referring Provider (OT): Jamse Arn   Encounter Date: 04/01/2019  OT End of Session - 04/01/19 1437    Visit Number  11    Number of Visits  15    Date for OT Re-Evaluation  04/12/19    Authorization Type  BCBS - pt has 30 VL combined with PT; pt wishes to additional 10 visits for OT, 5 additional visits for PT and reserve 5 visits.     Authorization - Visit Number  11    Authorization - Number of Visits  15    OT Start Time  1157    OT Stop Time  2992    OT Time Calculation (min)  47 min    Activity Tolerance  Patient tolerated treatment well       Past Medical History:  Diagnosis Date  . Arthritis   . CAD (coronary artery disease)    a. s/p NSTEMI in 10/2017 with cath showing LM disease --> s/p CABG on 11/23/2017 with LIMA-LAD and SVG-OM.   [name] Hay fever   . Ischemic cardiomyopathy    a. EF 35-40% by echo in 10/2017  . NSTEMI (non-ST elevated myocardial infarction) ([place])   . PAF (paroxysmal atrial fibrillation) ([place])    Diagnosed by ILR  . Pneumonia 11/16/2017  . 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 CORONARY ARTERY BYPASS GRAFT N/A 11/23/2017   Procedure: CORONARY ARTERY BYPASS GRAFTING (CABG) x 2, ON PUMP, USING LEFT INTERNAL MAMMARY ARTERY AND RIGHT GREATER SAPHENOUS VEIN HARVESTED ENDOSCOPICALLY;  Surgeon: Gaye Pollack, MD;  Location: [facility];  Service: Open Heart Surgery;  Laterality: N/A;  . CRYOABLATION     cervical  . IR ANGIO INTRA EXTRACRAN SEL COM CAROTID INNOMINATE UNI R MOD SED  08/21/2017  . IR ANGIO VERTEBRAL SEL SUBCLAVIAN INNOMINATE UNI R MOD SED  08/21/2017  . IR PERCUTANEOUS ART  THROMBECTOMY/INFUSION INTRACRANIAL INC DIAG ANGIO  08/21/2017  . IR RADIOLOGIST EVAL & MGMT  10/30/2017  . LOOP RECORDER INSERTION N/A 08/28/2017   Procedure: LOOP RECORDER INSERTION;  Surgeon: Constance Haw, MD;  Location: [facility];  Service: Cardiovascular;  Laterality: N/A;  . RADIOLOGY WITH ANESTHESIA N/A 08/21/2017   Procedure: RADIOLOGY WITH ANESTHESIA;  Surgeon: Luanne Bras, MD;  Location: [facility];  Service: Radiology;  Laterality: N/A;  . RIGHT/LEFT HEART CATH AND CORONARY ANGIOGRAPHY N/A 11/20/2017   Procedure: RIGHT/LEFT HEART CATH AND CORONARY ANGIOGRAPHY;  Surgeon: Jettie Booze, MD;  Location: [facility];  Service: Cardiovascular;  Laterality: N/A;  . TEE WITHOUT CARDIOVERSION N/A 08/28/2017   Procedure: TRANSESOPHAGEAL ECHOCARDIOGRAM (TEE);  Surgeon: Fay Records, MD;  Location: [facility];  Service: Cardiovascular;  Laterality: N/A;  . TEE WITHOUT CARDIOVERSION N/A 11/23/2017   Procedure: TRANSESOPHAGEAL ECHOCARDIOGRAM (TEE);  Surgeon: Gaye Pollack, MD;  Location: [facility];  Service: Open Heart Surgery;  Laterality: N/A;  . TUBAL LIGATION      There were no vitals filed for this visit.  Subjective Assessment - 04/01/19 1434    Subjective   I can tell that I am moving faster and my balance seems a little better    Pertinent History  L MCA CVA in 07/2017 s/p TPA, thrombectomy; PMHx: loop recorder, CAD, CABG 10/2017, MI, bil knee OA    Patient Stated Goals  wants to walk better without AD; wants to be able to use R arm better, to write; less pain in L UE     Currently in Pain?  Yes    Pain Score  4     Pain Location  Knee    Pain Orientation  Right;Left    Pain Descriptors / Indicators  Sore    Pain Type  Chronic pain    Pain Onset  More than a month ago    Pain Frequency  Constant    Aggravating Factors   walking or standing too much    Pain Relieving Factors  rest, exercising in the water helps         Patient seen for aquatic therapy  today.  Treatment took place in water 2.5-4 feet deep depending upon activity.  Pt entered the pool via steps using 2 railings (cues to incorporate RUE) with close supervision and min assist for RLE foot placement - pt also needs cues for postural alignment.  Addressed RLE ankle ROM using aqua stretch techniques in prep for use of air cast for ankle alignment before mobility.  Addressed functional ambulation in open water using dumb bell for light UE support with emphasis on speed of gait, length of step and increased activation of RLE.  Addressed side stepping in open water using light UE support and cues - pt able to side step to the right for full length of pool however was unable to side step to the left due to increased L knee pain.  Addressed RUE arom/strength for chest presses, horizontal ab/adduction with straight elbow, and elbow flexion/extension using light dumb bell with partial or full submersion - pt needs mod cues and intermittent min a.  Also addressed RLE knee flexion/extension as well as hip flexion extension with knee flexed with min facilitation.  Utilized backward walking with 1 UE support on pool wall with emphasis on postural alignment and step length as well as dynamic standing balance.  Addressed active sit to stand as well as stand to sit from seated on bench using single dumb bell bar to facilitate forward weight shift and cues to power up with LE's.  Begun to overtly discuss how pt can incorporate activities into HEP as pt has a pool at her home (will provide written HEP once closer to discharge however given pt's cognitive deficits will discuss each session starting today).                       OT Short Term Goals - 04/01/19 1435      OT SHORT TERM GOAL #1   Title  Pt will be mod I with home activities program for RUE functional use - 03/14/2019    Status  Achieved      OT SHORT TERM GOAL #2   Title  Pt will demonstrate ability to incorporate RUE into self  feeding activities (LTG moved to STG)    Status  Achieved      OT SHORT TERM GOAL #3   Title  Pt will demonstrate ability to use RUE as gross assist 25% of the time in basic self care (LTG moved to STG)    Status  Achieved        OT Long Term Goals - 04/01/19 1436      OT LONG TERM  GOAL #1   Title  Pt will be independent in updated HEP including water based program for RUE - 04/12/2019    Status  On-going      OT LONG TERM GOAL #2   Title  Pt will self-report able to complete morning ADL routine in 45 min or less.     Baseline  currently reports takes approx 1hr to perform     Status  Achieved      OT LONG TERM GOAL #3   Title  Pt will demonstrate ability to perform simple cold meal prep at mod independent.     Status  Deferred   per pt request     OT LONG TERM GOAL #4   Title  Pt will demonstrate ability to use RUE as gross assist 50% of the time within basic self care.     Status  Achieved      OT LONG TERM GOAL #5   Title  Pt will demonstrate ability for bil low reach with 50* shoulder flexion during functional activities.     Status  On-going      OT LONG TERM GOAL #6   Title  Pt will demonstrate increased grip strength of 3lb or more in RUE to assist with bil reaching task.     Baseline  8lb     Status  On-going      OT LONG TERM GOAL #7   Title  Pt will verbalize understanding for RUE positionig in bed in preparation for returning to sleeping in bed vs recliner.     Status  Achieved      OT LONG TERM GOAL #8   Title  Pt will be able to write a simple sentence with RUE with min compensation and AE prn    Status  On-going      OT LONG TERM GOAL  #9   TITLE  Pt will demonstrate ability to incorporate RUE as gross assist during self feeding activities    Status  Achieved            Plan - 04/01/19 1436    Clinical Impression Statement  Pt continues to progress toward goals. Pt requires encouragement and cues to use RUE fully due to R inattention and  impaired cognition    OT Occupational Profile and History  Problem Focused Assessment - Including review of records relating to presenting problem    Occupational Profile and client history currently impacting functional performance  wife, homemaker, enjoys flowers/gardening  PMH: L MCA CVA (07/2017) s/p TPA, thrombectomy, loop recorder, CAD, CABG, cardiac debility, MI, tobacco use, HTN, bil knee OA    Occupational performance deficits (Please refer to evaluation for details):  ADL's;Leisure;IADL's;Rest and Sleep;Social Participation    Body Structure / Function / Physical Skills  ADL;Balance;UE functional use;Strength;Mobility;Tone;IADL;GMC    Clinical Decision Making  Limited treatment options, no task modification necessary    Comorbidities Affecting Occupational Performance:  May have comorbidities impacting occupational performance    OT Frequency  2x / week    OT Duration  Other (comment)   5 weeks   OT Treatment/Interventions  Self-care/ADL training;Therapeutic exercise;Balance training;Fluidtherapy;Manual Therapy;Neuromuscular education;Patient/family education;Therapeutic activities;Passive range of motion;Energy conservation;DME and/or AE instruction;Functional Mobility Training;Splinting;Aquatic Therapy;Moist Heat    Plan  complete splint, practice using splint functionally, NMR for RUE/Trunk, low reach/functional reach,  aquatic therapy    Consulted and Agree with Plan of Care  Patient       Patient will benefit from skilled therapeutic intervention  in order to improve the following deficits and impairments:   Body Structure / Function / Physical Skills: ADL, Balance, UE functional use, Strength, Mobility, Tone, IADL, GMC       Visit Diagnosis: 1. Muscle weakness (generalized)   2. Unsteadiness on feet   3. Hemiplegia and hemiparesis following cerebral infarction affecting right dominant side ([place])   4. Abnormal posture   5. Other lack of coordination       Problem  List Patient Active Problem List   Diagnosis Date Noted  . Carpal tunnel syndrome of left wrist 03/15/2019  . Anxiety 10/24/2018  . Numbness of left hand 10/19/2018  . Spastic hemiparesis affecting dominant side ([place]) 09/20/2018  . Primary osteoarthritis of both knees 07/04/2018  . Iron deficiency 04/23/2018  . Chronic urticaria 03/20/2018  . Atherosclerosis of aorta ([place]) 02/02/2018  . Osteoarthritis of left knee 01/16/2018  . Osteoarthritis of right knee 01/16/2018  . Sequela of cerebrovascular accident 01/16/2018  . Paroxysmal atrial fibrillation ([place]) 12/19/2017  . Chronic combined systolic and diastolic CHF (congestive heart failure) ([place]) 12/16/2017  . Benign essential HTN   . Supplemental oxygen dependent   . 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 Coronary artery disease 11/23/2017  . NSTEMI (non-ST elevated myocardial infarction) ([place]) 11/16/2017  . Former smoker 10/13/2017  . Left middle cerebral artery stroke ([place]) 08/28/2017  . Cerebrovascular accident (CVA) due to occlusion of left middle cerebral artery ([place])   . Right hemiplegia ([place])   . Dysphagia, post-stroke   . Hyperlipidemia LDL goal <70     Quay Burow, OTR/L 04/01/2019, 2:38 PM  [facility] [address] [place], [place], [address] Phone: [phone]   Fax:  [phone]  Name: [name] MRN: [id number] Date of Birth: [birth year]

## 2019-04-01 NOTE — Telephone Encounter (Signed)
Xarelto 20mg  refill request received; pt is 58 yrs old, wt-56.2kg, Crea-0.62 on 06/01/2018, last seen by [place] on 10/03/2018, CrCl-87.31ml/min; will send in refill to requested pharamcy.

## 2019-04-02 NOTE — Therapy (Signed)
[facility] [address] [place], [place], [address] Phone: [phone]   Fax:  [phone]  Physical Therapy Treatment  Patient Details  Name: [name] MRN: [id number] Date of Birth: 06/08/1961 Referring Provider (PT): Jamse Arn, MD   Encounter Date: 03/27/2019   CLINIC OPERATION CHANGES: [facility] is open at lower capacity following universal masking, social distancing, and patient screening.      03/27/19 1602  PT Visits / Re-Eval  Visit Number 6  Number of Visits 8  Date for PT Re-Evaluation 04/30/19  Authorization  Authorization Type BCBS (Ded & Oop have been met. Pt has no financial responsibility). 30 visit limit for PT/OT combined.  Authorization - Visit Number 6  Authorization - Number of Visits 30  PT Time Calculation  PT Start Time 1600  PT Stop Time 1646  PT Time Calculation (min) 46 min  PT - End of Session  Activity Tolerance Patient limited by pain;Patient tolerated treatment well (c/o left knee pain in tall kneeling and quadruped position)  Behavior During Therapy WFL for tasks assessed/performed    Past Medical History:  Diagnosis Date  . Arthritis   . CAD (coronary artery disease)    a. s/p NSTEMI in 10/2017 with cath showing LM disease --> s/p CABG on 11/23/2017 with LIMA-LAD and SVG-OM.   [name] Hay fever   . Ischemic cardiomyopathy    a. EF 35-40% by echo in 10/2017  . NSTEMI (non-ST elevated myocardial infarction) ([place])   . PAF (paroxysmal atrial fibrillation) ([place])    Diagnosed by ILR  . Pneumonia 11/16/2017  . Stroke [facility]) 08/23/2017   Left MCA infarct status post TPA and thrombectomy    Past Surgical History:  Procedure Laterality Date  . CORONARY ARTERY BYPASS GRAFT N/A 11/23/2017   Procedure: CORONARY ARTERY BYPASS GRAFTING (CABG) x 2, ON PUMP, USING LEFT INTERNAL MAMMARY ARTERY AND RIGHT GREATER SAPHENOUS VEIN HARVESTED ENDOSCOPICALLY;  Surgeon: Gaye Pollack, MD;  Location: [facility];  Service: Open Heart Surgery;  Laterality: N/A;  . CRYOABLATION     cervical  . IR ANGIO INTRA EXTRACRAN SEL COM CAROTID INNOMINATE UNI R MOD SED  08/21/2017  . IR ANGIO VERTEBRAL SEL SUBCLAVIAN INNOMINATE UNI R MOD SED  08/21/2017  . IR PERCUTANEOUS ART THROMBECTOMY/INFUSION INTRACRANIAL INC DIAG ANGIO  08/21/2017  . IR RADIOLOGIST EVAL & MGMT  10/30/2017  . LOOP RECORDER INSERTION N/A 08/28/2017   Procedure: LOOP RECORDER INSERTION;  Surgeon: Constance Haw, MD;  Location: [facility];  Service: Cardiovascular;  Laterality: N/A;  . RADIOLOGY WITH ANESTHESIA N/A 08/21/2017   Procedure: RADIOLOGY WITH ANESTHESIA;  Surgeon: Luanne Bras, MD;  Location: [facility];  Service: Radiology;  Laterality: N/A;  . RIGHT/LEFT HEART CATH AND CORONARY ANGIOGRAPHY N/A 11/20/2017   Procedure: RIGHT/LEFT HEART CATH AND CORONARY ANGIOGRAPHY;  Surgeon: Jettie Booze, MD;  Location: [facility];  Service: Cardiovascular;  Laterality: N/A;  . TEE WITHOUT CARDIOVERSION N/A 08/28/2017   Procedure: TRANSESOPHAGEAL ECHOCARDIOGRAM (TEE);  Surgeon: Fay Records, MD;  Location: [facility];  Service: Cardiovascular;  Laterality: N/A;  . TEE WITHOUT CARDIOVERSION N/A 11/23/2017   Procedure: TRANSESOPHAGEAL ECHOCARDIOGRAM (TEE);  Surgeon: Gaye Pollack, MD;  Location: [facility];  Service: Open Heart Surgery;  Laterality: N/A;  . TUBAL LIGATION      There were no vitals filed for this visit.     03/27/19 1601  Symptoms/Limitations  Subjective Needs help ordering the air  cast, her order got messed up and now her internet is not working.  Limitations Standing;Walking  Patient Stated Goals walk without knee issues, decrease knee pain with activity, improve posture and body mechanics during gait; per husband - get back to dancing   Pain Assessment  Currently in Pain? Yes  Pain Score 6  Pain Location Knee  Pain Orientation Right;Left  Pain Descriptors / Indicators  Aching;Dull;Sore  Pain Onset More than a month ago  Pain Frequency Constant  Aggravating Factors  standing and walking too much  Pain Relieving Factors rest; aquatics      03/27/19 1606  Transfers  Transfers Sit to Stand;Stand to Sit  Sit to Stand 6: Modified independent (Device/Increase time)  Stand to Sit 6: Modified independent (Device/Increase time)  Ambulation/Gait  Ambulation/Gait Yes  Ambulation/Gait Assistance 5: Supervision;4: Min guard  Ambulation/Gait Assistance Details cues on posture, base of support and step length   Ambulation Distance (Feet) 115 Feet (x1, plus laps over mats)  Assistive device None  Gait Pattern Step-through pattern;Decreased arm swing - right;Decreased stride length;Decreased arm swing - left;Antalgic  Ambulation Surface Level;Indoor  Neuro Re-ed   Neuro Re-ed Details  for strengthening/balance: standing on airex- heel/toe raises, alternating slow marching for 10 reps each with light finertip support on chair back for balance; then with feet apart EC no head movements, progressed to feet together for EC no head movements, each for 30 sec holds for 2-3 reps each position; then standing with feet hip width apart for EC head movements left<>right, up<>down for 10 reps each. min guard to min assist for balance with cues on posture and weight shifting to assist with balance.               Knee/Hip Exercises: Standing  Forward Step Up Both;Hand Hold: 1;Step Height: 4";10 reps;Limitations  Forward Step Up Limitations cues on form and technique  Knee/Hip Exercises: Seated  Hamstring Curl AROM;Strengthening;Both;2 sets;10 reps;Limitations  Hamstring Limitations red band resistance with cues for slow, controlled movements       PT Short Term Goals - 03/15/19 1253      PT SHORT TERM GOAL #1   Title  Pt will verbalize understanding of initial HEP.     Time  4    Period  Weeks    Status  New    Target Date  03/29/19      PT SHORT TERM GOAL #2   Title   Pt is able to ambulate, carry on a conversation, and scan environment with no loss of balance     Status  Deferred      PT SHORT TERM GOAL #3   Title  Patient verbalizes understanding for recommendations for assistive device for safety & decrease stress for OA.     Status  Deferred      PT SHORT TERM GOAL #4   Time  4        PT Long Term Goals - 03/15/19 1253      PT LONG TERM GOAL #1   Title  Patient verbalizes & demonstrates of ongoing HEP & community fitness program.    Time  8    Period  Weeks    Status  New      PT LONG TERM GOAL #2   Title  Improve difference between normal and cognitive TUG to be <10% to decrease falls risk    Status  Deferred      PT LONG TERM GOAL #3   Title  patient ambulates 1000' outdoors including grass modified Independent with LRAD and no loss of balance - REVISED to 500' with air cast on RLE with SBQC    Time  8    Period  Weeks    Status  Revised      PT LONG TERM GOAL #4   Title  Improve gait velocity to >2.62 ft/sec with LRAD to improve community mobility     Baseline  2.23 ft/sec on 3/11     Time  8    Period  Weeks    Status  New      PT LONG TERM GOAL #5   Title  Patient negotiates ramps, curbs & stairs with LRAD modified independent to enable community access.     Baseline  02/06/18 Pt/husband report pt is walking around the barn (~120 ft) over unlevel ground (deferred attempting 500 ft due to bil knee pain and pt anxious not to do anything to incr pain)    Time  8    Period  Weeks    Status  New      PT LONG TERM GOAL #6   Title  Patient verbalizes how to manage chronic OA knee pain including modifying activity level & assistive device.     Status  Deferred         03/27/19 1603  Plan  Clinical Impression Statement Today's skilled session continued to focus on gait with aircast/no device, LE strengthening and balance reactions. No significant increase in pain reported, did increase by 1-2 increments that decreased with rest.  The pt is progressing toward goals and should benefit from continued PT to progress toward unmet goals.  Personal Factors and Comorbidities Comorbidity 3+;Time since onset of injury/illness/exacerbation  Comorbidities CVA 08/23/2017, CAD, NSTEMI, A-Fib, 11/23/2017 CABGx2,   Examination-Activity Limitations Squat;Stand;Locomotion Level  Examination-Participation Restrictions Shop;Community Activity  Pt will benefit from skilled therapeutic intervention in order to improve on the following deficits Abnormal gait;Decreased range of motion;Impaired tone;Decreased balance;Decreased knowledge of use of DME;Impaired flexibility;Improper body mechanics;Decreased mobility;Postural dysfunction  Rehab Potential Good  PT Frequency Other (comment)  PT Duration 6 weeks  PT Treatment/Interventions Gait training;Stair training;Functional mobility training;DME Instruction;Therapeutic exercise;Therapeutic activities;Balance training;Neuromuscular re-education;Patient/family education;ADLs/Self Care Home Management;Manual techniques;Moist Heat  PT Next Visit Plan cont RLE strengthening and balance training - air cast has been ordered per pt report; encourage Rt knee extension in stance as pt keeps knee slightly flexed  PT Home Exercise Plan see pt instructions - added hip abdct. and hamstring stretches on 03-07-19  Consulted and Agree with Plan of Care Patient          Patient will benefit from skilled therapeutic intervention in order to improve the following deficits and impairments:  Abnormal gait, Decreased range of motion, Impaired tone, Decreased balance, Decreased knowledge of use of DME, Impaired flexibility, Improper body mechanics, Decreased mobility, Postural dysfunction  Visit Diagnosis: 1. Muscle weakness (generalized)   2. Unsteadiness on feet   3. Hemiplegia and hemiparesis following cerebral infarction affecting right dominant side [facility])        Problem List Patient Active Problem List    Diagnosis Date Noted  . Carpal tunnel syndrome of left wrist 03/15/2019  . Anxiety 10/24/2018  . Numbness of left hand 10/19/2018  . Spastic hemiparesis affecting dominant side ([place]) 09/20/2018  . Primary osteoarthritis of both knees 07/04/2018  . Iron deficiency 04/23/2018  . Chronic urticaria 03/20/2018  . Atherosclerosis of aorta ([place]) 02/02/2018  . Osteoarthritis of left knee 01/16/2018  .  Osteoarthritis of right knee 01/16/2018  . Sequela of cerebrovascular accident 01/16/2018  . Paroxysmal atrial fibrillation ([place]) 12/19/2017  . Chronic combined systolic and diastolic CHF (congestive heart failure) ([place]) 12/16/2017  . Benign essential HTN   . Supplemental oxygen dependent   . 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 Coronary artery disease 11/23/2017  . NSTEMI (non-ST elevated myocardial infarction) ([place]) 11/16/2017  . Former smoker 10/13/2017  . 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 Cerebrovascular accident (CVA) due to occlusion of left middle cerebral artery ([place])   . Right hemiplegia ([place])   . Dysphagia, post-stroke   . Hyperlipidemia LDL goal <70     Willow Ora, PTA, [facility] [address] [place], [place] [address] [phone] 04/02/19, 8:46 AM   Name: [name] MRN: [id number] Date of Birth: 03-Feb-1961

## 2019-04-03 ENCOUNTER — Encounter: Payer: Self-pay | Admitting: Occupational Therapy

## 2019-04-03 ENCOUNTER — Encounter: Payer: Self-pay | Admitting: Physical Therapy

## 2019-04-03 ENCOUNTER — Other Ambulatory Visit: Payer: Self-pay

## 2019-04-03 ENCOUNTER — Ambulatory Visit: Payer: BC Managed Care – PPO | Admitting: Physical Therapy

## 2019-04-03 ENCOUNTER — Ambulatory Visit: Payer: BC Managed Care – PPO | Admitting: Occupational Therapy

## 2019-04-03 DIAGNOSIS — I69351 Hemiplegia and hemiparesis following cerebral infarction affecting right dominant side: Secondary | ICD-10-CM | POA: Diagnosis not present

## 2019-04-03 DIAGNOSIS — R293 Abnormal posture: Secondary | ICD-10-CM | POA: Diagnosis not present

## 2019-04-03 DIAGNOSIS — R2681 Unsteadiness on feet: Secondary | ICD-10-CM | POA: Diagnosis not present

## 2019-04-03 DIAGNOSIS — M6281 Muscle weakness (generalized): Secondary | ICD-10-CM

## 2019-04-03 DIAGNOSIS — R278 Other lack of coordination: Secondary | ICD-10-CM | POA: Diagnosis not present

## 2019-04-03 DIAGNOSIS — R2689 Other abnormalities of gait and mobility: Secondary | ICD-10-CM | POA: Diagnosis not present

## 2019-04-03 NOTE — Patient Instructions (Signed)
Your Splint This splint should initially be fitted by a healthcare practitioner.  The healthcare practitioner is responsible for providing wearing instructions and precautions to the patient, other healthcare practitioners and care provider involved in the patient's care.  This splint was custom made for you. Please read the following instructions to learn about wearing and caring for your splint.  Precautions Should your splint cause any of the following problems, remove the splint immediately and contact your therapist/physician.  Swelling  Severe Pain  Pressure Areas  Stiffness  Numbness   When To Wear Your Splint This splint is intended to be worn when you are using your hand for something.  So for example you wouldn't wear it if you were sitting watching tv.  The first week just try and wear it for about an hour in the morning and an hour in the afternoon.  Make a note of any red areas that don't go away within about 10-15 minutes and let your therapist know.  After the first week if there are no problems with the splint then try and do 2 hours in the morning and 2 hours in the afternoon. That's it - the goal is a total of 4 hours a day and no more. Don't do all 4 hours at one time.  While you are wearing your splint try and use the hand like we did in the gym - work on picking up small light items and putting them in a container or picking up light items using 2 hands.  You can work on your dowel exercises using the splint also.     Care and Cleaning of Your Splint 1. Keep your splint away from open flames. 2. Your splint will lose its shape in temperatures over 135 degrees Farenheit, ( in car windows, near radiators, ovens or in hot water).  Never make any adjustments to your splint, if the splint needs adjusting remove it and make an appointment to see your therapist. 3. Your splint, including the cushion liner may be cleaned with soap and lukewarm water.  Do not immerse in hot  water over 135 degrees Farenheit. 4. Straps may be washed with soap and water, but do not moisten the self-adhesive portion. 5. For ink or hard to remove spots use a scouring cleanser which contains chlorine.  Rinse the splint thoroughly after using chlorine cleanser.

## 2019-04-03 NOTE — Therapy (Signed)
[facility] [address] [place], [place], [address] Phone: [phone]   Fax:  [phone]  Occupational Therapy Treatment  Patient Details  Name: [name] MRN: [id number] Date of Birth: [birth year] Referring Provider (OT): Jamse Arn   Encounter Date: 04/03/2019  OT End of Session - 04/03/19 1756    Visit Number  12    Number of Visits  15    Date for OT Re-Evaluation  04/12/19    Authorization Type  BCBS - pt has 30 VL combined with PT; pt wishes to additional 10 visits for OT, 5 additional visits for PT and reserve 5 visits.     Authorization - Visit Number  12    Authorization - Number of Visits  15    OT Start Time  1701    OT Stop Time  8338    OT Time Calculation (min)  43 min       Past Medical History:  Diagnosis Date  . Arthritis   . CAD (coronary artery disease)    a. s/p NSTEMI in 10/2017 with cath showing LM disease --> s/p CABG on 11/23/2017 with LIMA-LAD and SVG-OM.   [name] Hay fever   . Ischemic cardiomyopathy    a. EF 35-40% by echo in 10/2017  . NSTEMI (non-ST elevated myocardial infarction) ([place])   . PAF (paroxysmal atrial fibrillation) ([place])    Diagnosed by ILR  . Pneumonia 11/16/2017  . 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 CORONARY ARTERY BYPASS GRAFT N/A 11/23/2017   Procedure: CORONARY ARTERY BYPASS GRAFTING (CABG) x 2, ON PUMP, USING LEFT INTERNAL MAMMARY ARTERY AND RIGHT GREATER SAPHENOUS VEIN HARVESTED ENDOSCOPICALLY;  Surgeon: Gaye Pollack, MD;  Location: [facility];  Service: Open Heart Surgery;  Laterality: N/A;  . CRYOABLATION     cervical  . IR ANGIO INTRA EXTRACRAN SEL COM CAROTID INNOMINATE UNI R MOD SED  08/21/2017  . IR ANGIO VERTEBRAL SEL SUBCLAVIAN INNOMINATE UNI R MOD SED  08/21/2017  . IR PERCUTANEOUS ART THROMBECTOMY/INFUSION INTRACRANIAL INC DIAG ANGIO  08/21/2017   . IR RADIOLOGIST EVAL & MGMT  10/30/2017  . LOOP RECORDER INSERTION N/A 08/28/2017   Procedure: LOOP RECORDER INSERTION;  Surgeon: Constance Haw, MD;  Location: [facility];  Service: Cardiovascular;  Laterality: N/A;  . RADIOLOGY WITH ANESTHESIA N/A 08/21/2017   Procedure: RADIOLOGY WITH ANESTHESIA;  Surgeon: Luanne Bras, MD;  Location: [facility];  Service: Radiology;  Laterality: N/A;  . RIGHT/LEFT HEART CATH AND CORONARY ANGIOGRAPHY N/A 11/20/2017   Procedure: RIGHT/LEFT HEART CATH AND CORONARY ANGIOGRAPHY;  Surgeon: Jettie Booze, MD;  Location: [facility];  Service: Cardiovascular;  Laterality: N/A;  . TEE WITHOUT CARDIOVERSION N/A 08/28/2017   Procedure: TRANSESOPHAGEAL ECHOCARDIOGRAM (TEE);  Surgeon: Fay Records, MD;  Location: [facility];  Service: Cardiovascular;  Laterality: N/A;  . TEE WITHOUT CARDIOVERSION N/A 11/23/2017   Procedure: TRANSESOPHAGEAL ECHOCARDIOGRAM (TEE);  Surgeon: Gaye Pollack, MD;  Location: [facility];  Service: Open Heart Surgery;  Laterality: N/A;  . TUBAL LIGATION      There were no vitals filed for this visit.  Subjective Assessment - 04/03/19 1657    Subjective   I really have to work at opening my fingers with this splint on    Pertinent History  L MCA CVA in 07/2017 s/p TPA, thrombectomy; PMHx: loop recorder,  CAD, CABG 10/2017, MI, bil knee OA    Patient Stated Goals  wants to walk better without AD; wants to be able to use R arm better, to write; less pain in L UE     Currently in Pain?  Yes    Pain Score  5     Pain Location  Knee    Pain Orientation  Right;Left    Pain Descriptors / Indicators  Sore    Pain Type  Chronic pain    Pain Onset  More than a month ago    Pain Frequency  Constant    Aggravating Factors   walking or standing too much    Pain Relieving Factors  rest, exercising in the water usually helps                   OT Treatments/Exercises (OP) - 04/03/19 1744      Neurological  Re-education Exercises   Other Exercises 1  Neuro re ed using wrist cock up to address functional grasp and release for small itmes as well as cylindrical items to address various hand orientation.  Incorporated functional reach into activities using assisted reach technique. Pt continues to need max review, practice and cues to use assisted reach technique effectively.        Splinting   Splinting  completed fabrication of custom wrist cock up splint to assist pt in functional use of the R hand. Pt educated on wearing schedule as well as care of splint and verbaliized understanding.              OT Education - 04/03/19 1748    Education Details  wear and care of splint    Person(s) Educated  Patient    Methods  Explanation;Demonstration;Handout    Comprehension  Verbalized understanding;Returned demonstration       OT Short Term Goals - 04/03/19 1748      OT SHORT TERM GOAL #1   Title  Pt will be mod I with home activities program for RUE functional use - 03/14/2019    Status  Achieved      OT SHORT TERM GOAL #2   Title  Pt will demonstrate ability to incorporate RUE into self feeding activities (LTG moved to STG)    Status  Achieved      OT SHORT TERM GOAL #3   Title  Pt will demonstrate ability to use RUE as gross assist 25% of the time in basic self care (LTG moved to STG)    Status  Achieved        OT Long Term Goals - 04/03/19 1750      OT LONG TERM GOAL #1   Title  Pt will be independent in updated HEP including water based program for RUE -04/26/19 (renewed as pt adjusted frequency)    Status  On-going      OT LONG TERM GOAL #2   Title  Pt will self-report able to complete morning ADL routine in 45 min or less.     Baseline  currently reports takes approx 1hr to perform     Status  Achieved      OT LONG TERM GOAL #3   Title  Pt will demonstrate ability to perform simple cold meal prep at mod independent.     Status  Deferred   per pt request     OT LONG  TERM GOAL #4   Title  Pt will demonstrate ability to use RUE as gross  assist 50% of the time within basic self care.     Status  Achieved      OT LONG TERM GOAL #5   Title  Pt will demonstrate ability for bil low reach with 50* shoulder flexion during functional activities.     Status  On-going      OT LONG TERM GOAL #6   Title  Pt will demonstrate increased grip strength of 3lb or more in RUE to assist with bil reaching task.     Baseline  8lb     Status  On-going      OT LONG TERM GOAL #7   Title  Pt will verbalize understanding for RUE positionig in bed in preparation for returning to sleeping in bed vs recliner.     Status  Achieved      OT LONG TERM GOAL #8   Title  Pt will be able to write a simple sentence with RUE with min compensation and AE prn    Status  On-going      OT LONG TERM GOAL  #9   TITLE  Pt will demonstrate ability to incorporate RUE as gross assist during self feeding activities    Status  Achieved            Plan - 04/03/19 1754    Clinical Impression Statement  Pt continues to demonstrate improved ability to use RUE functionally however needs reminders    OT Occupational Profile and History  Problem Focused Assessment - Including review of records relating to presenting problem    Occupational Profile and client history currently impacting functional performance  wife, homemaker, enjoys flowers/gardening  PMH: L MCA CVA (07/2017) s/p TPA, thrombectomy, loop recorder, CAD, CABG, cardiac debility, MI, tobacco use, HTN, bil knee OA    Occupational performance deficits (Please refer to evaluation for details):  ADL's;Leisure;IADL's;Rest and Sleep;Social Participation    Body Structure / Function / Physical Skills  ADL;Balance;UE functional use;Strength;Mobility;Tone;IADL;GMC    Rehab Potential  Good    Clinical Decision Making  Limited treatment options, no task modification necessary    Comorbidities Affecting Occupational Performance:  May have  comorbidities impacting occupational performance    OT Frequency  Other (comment)   2x/wk x1 more week then 1x/wk x3 weeks   OT Duration  Other (comment)   2x/wk x1 more week then 1x/wk x 3 weeks   OT Treatment/Interventions  Self-care/ADL training;Therapeutic exercise;Balance training;Fluidtherapy;Manual Therapy;Neuromuscular education;Patient/family education;Therapeutic activities;Passive range of motion;Energy conservation;DME and/or AE instruction;Functional Mobility Training;Splinting;Aquatic Therapy;Moist Heat    Plan  check splint,  NMR for RUE/Trunk, low reach/functional reach,  aquatic therapy    Consulted and Agree with Plan of Care  Patient       Patient will benefit from skilled therapeutic intervention in order to improve the following deficits and impairments:   Body Structure / Function / Physical Skills: ADL, Balance, UE functional use, Strength, Mobility, Tone, IADL, GMC       Visit Diagnosis: 1. Muscle weakness (generalized)   2. Unsteadiness on feet   3. Hemiplegia and hemiparesis following cerebral infarction affecting right dominant side ([place])   4. Abnormal posture   5. Other lack of coordination       Problem List Patient Active Problem List   Diagnosis Date Noted  . Carpal tunnel syndrome of left wrist 03/15/2019  . Anxiety 10/24/2018  . Numbness of left hand 10/19/2018  . Spastic hemiparesis affecting dominant side ([place]) 09/20/2018  . Primary osteoarthritis of both knees 07/04/2018  .  Iron deficiency 04/23/2018  . Chronic urticaria 03/20/2018  . Atherosclerosis of aorta ([place]) 02/02/2018  . Osteoarthritis of left knee 01/16/2018  . Osteoarthritis of right knee 01/16/2018  . Sequela of cerebrovascular accident 01/16/2018  . Paroxysmal atrial fibrillation ([place]) 12/19/2017  . Chronic combined systolic and diastolic CHF (congestive heart failure) ([place]) 12/16/2017  . Benign essential HTN   . Supplemental oxygen dependent   . 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  Coronary artery disease 11/23/2017  . NSTEMI (non-ST elevated myocardial infarction) ([place]) 11/16/2017  . Former smoker 10/13/2017  . Left middle cerebral artery stroke ([place]) 08/28/2017  . Cerebrovascular accident (CVA) due to occlusion of left middle cerebral artery ([place])   . Right hemiplegia ([place])   . Dysphagia, post-stroke   . Hyperlipidemia LDL goal <70     Quay Burow, OTR/L 04/03/2019, 5:58 PM  [facility] [address] [place], [place], [address] Phone: [phone]   Fax:  [phone]  Name: [name] MRN: [id number] Date of Birth: 05/22/1961

## 2019-04-04 ENCOUNTER — Ambulatory Visit: Payer: BC Managed Care – PPO | Admitting: Physical Therapy

## 2019-04-04 NOTE — Therapy (Signed)
[facility] [address] [place], [place], [address] Phone: [phone]   Fax:  [phone]  Physical Therapy Treatment  Patient Details  Name: [name] MRN: [id number] Date of Birth: [birth year] Referring Provider (PT): Jamse Arn, MD   Encounter Date: 04/03/2019   CLINIC OPERATION CHANGES: [facility] is open at lower capacity following universal masking, social distancing, and patient screening.   PT End of Session - 04/03/19 1606    Visit Number  7    Number of Visits  8    Date for PT Re-Evaluation  04/30/19    Authorization Type  BCBS (Ded & Oop have been met. Pt has no financial responsibility). 30 visit limit for PT/OT combined.    Authorization - Visit Number  7    Authorization - Number of Visits  30    PT Start Time  1600    PT Stop Time  1645    PT Time Calculation (min)  45 min    Equipment Utilized During Treatment  Gait belt    Activity Tolerance  Patient limited by pain;Patient tolerated treatment well   c/o left knee pain in tall kneeling and quadruped position   Behavior During Therapy  WFL for tasks assessed/performed       Past Medical History:  Diagnosis Date  . Arthritis   . CAD (coronary artery disease)    a. s/p NSTEMI in 10/2017 with cath showing LM disease --> s/p CABG on 11/23/2017 with LIMA-LAD and SVG-OM.   [name] Hay fever   . Ischemic cardiomyopathy    a. EF 35-40% by echo in 10/2017  . NSTEMI (non-ST elevated myocardial infarction) ([place])   . PAF (paroxysmal atrial fibrillation) ([place])    Diagnosed by ILR  . Pneumonia 11/16/2017  . Stroke [facility]) 08/23/2017   Left MCA infarct status post TPA and thrombectomy    Past Surgical History:  Procedure Laterality Date  . CORONARY ARTERY BYPASS GRAFT N/A 11/23/2017   Procedure: CORONARY ARTERY BYPASS GRAFTING (CABG) x 2, ON PUMP, USING LEFT INTERNAL MAMMARY ARTERY AND RIGHT GREATER SAPHENOUS VEIN HARVESTED  ENDOSCOPICALLY;  Surgeon: Gaye Pollack, MD;  Location: [facility] [facility];  Service: Open Heart Surgery;  Laterality: N/A;  . CRYOABLATION     cervical  . IR ANGIO INTRA EXTRACRAN SEL COM CAROTID INNOMINATE UNI R MOD SED  08/21/2017  . IR ANGIO VERTEBRAL SEL SUBCLAVIAN INNOMINATE UNI R MOD SED  08/21/2017  . IR PERCUTANEOUS ART THROMBECTOMY/INFUSION INTRACRANIAL INC DIAG ANGIO  08/21/2017  . IR RADIOLOGIST EVAL & MGMT  10/30/2017  . LOOP RECORDER INSERTION N/A 08/28/2017   Procedure: LOOP RECORDER INSERTION;  Surgeon: Constance Haw, MD;  Location: [place] [facility];  Service: Cardiovascular;  Laterality: N/A;  . RADIOLOGY WITH ANESTHESIA N/A 08/21/2017   Procedure: RADIOLOGY WITH ANESTHESIA;  Surgeon: Luanne Bras, MD;  Location: [facility];  Service: Radiology;  Laterality: N/A;  . RIGHT/LEFT HEART CATH AND CORONARY ANGIOGRAPHY N/A 11/20/2017   Procedure: RIGHT/LEFT HEART CATH AND CORONARY ANGIOGRAPHY;  Surgeon: Jettie Booze, MD;  Location: [facility];  Service: Cardiovascular;  Laterality: N/A;  . TEE WITHOUT CARDIOVERSION N/A 08/28/2017   Procedure: TRANSESOPHAGEAL ECHOCARDIOGRAM (TEE);  Surgeon: Fay Records, MD;  Location: [facility];  Service: Cardiovascular;  Laterality: N/A;  . TEE WITHOUT CARDIOVERSION N/A 11/23/2017   Procedure: TRANSESOPHAGEAL ECHOCARDIOGRAM (TEE);  Surgeon: Gaye Pollack, MD;  Location: [facility];  Service: Open Heart Surgery;  Laterality: N/A;  .  TUBAL LIGATION      There were no vitals filed for this visit.  Subjective Assessment - 04/03/19 1603    Subjective  Still can't find the place that was recommended for air cast. (will follow up with primary PT). No falls. Left knee was aweful in the pool on Monday, was hurting prior to going.    Limitations  Standing;Walking    Patient Stated Goals  walk without knee issues, decrease knee pain with activity, improve posture and body mechanics during gait; per husband - get back to dancing     Currently in  Pain?  Yes    Pain Score  5     Pain Location  Neck    Pain Orientation  Right;Left   left > right   Pain Descriptors / Indicators  Sore    Pain Type  Chronic pain    Pain Onset  More than a month ago    Pain Frequency  Constant    Aggravating Factors   walking or standing too much    Pain Relieving Factors  rest, exercising in the water usually helps           [facility] Adult PT Treatment/Exercise - 04/03/19 1607      Transfers   Transfers  Sit to Stand;Stand to Sit    Sit to Stand  6: Modified independent (Device/Increase time)    Stand to Sit  6: Modified independent (Device/Increase time)      Ambulation/Gait   Ambulation/Gait  Yes    Ambulation/Gait Assistance  5: Supervision;4: Min guard    Ambulation/Gait Assistance Details  cues on posture, to incr base of support and step length with gait.     Ambulation Distance (Feet)  230 Feet   x1, plus into/out of/around gym   Assistive device  None;Other (Comment)   aircast to right foot   Gait Pattern  Step-through pattern;Decreased arm swing - right;Decreased stride length;Decreased arm swing - left;Antalgic    Ambulation Surface  Level;Indoor      High Level Balance   High Level Balance Activities  Side stepping;Backward walking;Tandem walking   tandem forward only   High Level Balance Comments  on blue mat in parallel bars: 3-4 laps each with light UE support on bars with cues on posture and technique. incr left knee pain with tandem gait that decreased with rest afterwards.       Neuro Re-ed    Neuro Re-ed Details   for balance/muscle re-ed: gait around track while moving scarf from hand to hand with min guard to min assist for balance, air cast to right ankle.           Balance Exercises - 04/03/19 1614      Balance Exercises: Standing   Standing Eyes Closed  Wide (BOA);Head turns;Foam/compliant surface;Other reps (comment);30 secs;Limitations    SLS with Vectors  Solid surface;Other reps (comment);Limitations     Balance Beam  standing across red foam beam: fwd stepping to floor/back onto beam for 5-6 reps each side, stopped due to increase left knee pain. min assist for balance      Balance Exercises: Standing   Standing Eyes Closed Limitations  standing across red foam beam with feet hip width apart: EC no head movements, progressing to EC head movements left<>right, up<>down and diagonal both ways with min to mod assist, cues on posture and weight shifting to assist with balance.      SLS with Vectors Limitations  on floor surface: with foam  bubbles- alternaitng forward toe taps, progressing to alternating cross toe taps for 8-10 reps each/each side with min guard assist, cues for increased base of support and weight shifting; with tall cones- alternating forward toe taps, progressing to alternating cross toe taps for 5-6 reps each/each side with min assit for balance, cues for base of support, weight shifting and for increased hip/knee flexion.            PT Short Term Goals - 04/03/19 2048      PT SHORT TERM GOAL #1   Title  Pt will verbalize understanding of initial HEP.     Baseline  04/03/2019: met with current program    Status  Achieved    Target Date  03/29/19      PT SHORT TERM GOAL #2   Title  Pt is able to ambulate, carry on a conversation, and scan environment with no loss of balance     Status  Deferred      PT SHORT TERM GOAL #3   Title  Patient verbalizes understanding for recommendations for assistive device for safety & decrease stress for OA.     Status  Deferred      PT SHORT TERM GOAL #4   Time  --        PT Long Term Goals - 04/04/19 2049      PT LONG TERM GOAL #1   Title  Patient verbalizes & demonstrates of ongoing HEP & community fitness program. (all LTGs due 04/30/2019)    Time  8    Period  Weeks    Status  On-going      PT LONG TERM GOAL #2   Title  Improve difference between normal and cognitive TUG to be <10% to decrease falls risk    Status  Deferred       PT LONG TERM GOAL #3   Title  patient ambulates 1000' outdoors including grass modified Independent with LRAD and no loss of balance - REVISED to 500' with air cast on RLE with SBQC    Time  8    Period  Weeks    Status  On-going      PT LONG TERM GOAL #4   Title  Improve gait velocity to >2.62 ft/sec with LRAD to improve community mobility     Baseline  2.23 ft/sec on 3/11     Time  8    Period  Weeks    Status  On-going      PT LONG TERM GOAL #5   Title  Patient negotiates ramps, curbs & stairs with LRAD modified independent to enable community access.     Baseline  --    Time  8    Period  Weeks    Status  On-going      PT LONG TERM GOAL #6   Title  Patient verbalizes how to manage chronic OA knee pain including modifying activity level & assistive device.     Status  Deferred            Plan - 04/03/19 1606    Clinical Impression Statement  Today's skilled session focused on dynamic gait with no AD and balance reactions. Pt with increased pain in left knee with some activities that decreased with rest breaks. The pt is making steady progress toward goals and should benefit from continued PT to progress toward unmet goals.    Personal Factors and Comorbidities  Comorbidity 3+;Time since onset of injury/illness/exacerbation  Comorbidities  CVA 08/23/2017, CAD, NSTEMI, A-Fib, 11/23/2017 CABGx2,     Examination-Activity Limitations  Squat;Stand;Locomotion Level    Examination-Participation Restrictions  Shop;Community Activity    Rehab Potential  Good    PT Frequency  Other (comment)    PT Duration  6 weeks    PT Treatment/Interventions  Gait training;Stair training;Functional mobility training;DME Instruction;Therapeutic exercise;Therapeutic activities;Balance training;Neuromuscular re-education;Patient/family education;ADLs/Self Care Home Management;Manual techniques;Moist Heat    PT Next Visit Plan  follow up on air cast- pt unable to recall PT rec's to google with  son best option to purchase it (thinks the PT gave her a specific place, not the case), continue to work on dynamic gait with dual tasking with no device, LE strengthening/balance reactions being mindful to knee pain    PT Home Exercise Plan  see pt instructions - added hip abdct. and hamstring stretches on 03-07-19    Consulted and Agree with Plan of Care  Patient       Patient will benefit from skilled therapeutic intervention in order to improve the following deficits and impairments:  Abnormal gait, Decreased range of motion, Impaired tone, Decreased balance, Decreased knowledge of use of DME, Impaired flexibility, Improper body mechanics, Decreased mobility, Postural dysfunction  Visit Diagnosis: 1. Muscle weakness (generalized)   2. Unsteadiness on feet   3. Hemiplegia and hemiparesis following cerebral infarction affecting right dominant side ([place])   4. Abnormal posture        Problem List Patient Active Problem List   Diagnosis Date Noted  . Carpal tunnel syndrome of left wrist 03/15/2019  . Anxiety 10/24/2018  . Numbness of left hand 10/19/2018  . Spastic hemiparesis affecting dominant side ([place]) 09/20/2018  . Primary osteoarthritis of both knees 07/04/2018  . Iron deficiency 04/23/2018  . Chronic urticaria 03/20/2018  . Atherosclerosis of aorta ([place]) 02/02/2018  . Osteoarthritis of left knee 01/16/2018  . Osteoarthritis of right knee 01/16/2018  . Sequela of cerebrovascular accident 01/16/2018  . Paroxysmal atrial fibrillation ([place]) 12/19/2017  . Chronic combined systolic and diastolic CHF (congestive heart failure) ([place]) 12/16/2017  . Benign essential HTN   . Supplemental oxygen dependent   . 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 Coronary artery disease 11/23/2017  . NSTEMI (non-ST elevated myocardial infarction) ([place]) 11/16/2017  . Former smoker 10/13/2017  . 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 Cerebrovascular accident (CVA) due to occlusion of left middle  cerebral artery ([place])   . Right hemiplegia ([place])   . Dysphagia, post-stroke   . Hyperlipidemia LDL goal <70     Willow Ora, PTA, [facility] [address] [place], [place] [address] [phone] 04/04/19, 9:04 PM   Name: [name] MRN: [id number] Date of Birth: [birth year]

## 2019-04-08 ENCOUNTER — Other Ambulatory Visit: Payer: Self-pay

## 2019-04-08 ENCOUNTER — Ambulatory Visit (INDEPENDENT_AMBULATORY_CARE_PROVIDER_SITE_OTHER): Payer: BC Managed Care – PPO | Admitting: *Deleted

## 2019-04-08 ENCOUNTER — Ambulatory Visit: Payer: BC Managed Care – PPO | Admitting: Occupational Therapy

## 2019-04-08 ENCOUNTER — Encounter: Payer: Self-pay | Admitting: Occupational Therapy

## 2019-04-08 DIAGNOSIS — R278 Other lack of coordination: Secondary | ICD-10-CM

## 2019-04-08 DIAGNOSIS — R2681 Unsteadiness on feet: Secondary | ICD-10-CM

## 2019-04-08 DIAGNOSIS — R293 Abnormal posture: Secondary | ICD-10-CM

## 2019-04-08 DIAGNOSIS — I5042 Chronic combined systolic (congestive) and diastolic (congestive) heart failure: Secondary | ICD-10-CM | POA: Diagnosis not present

## 2019-04-08 DIAGNOSIS — I69351 Hemiplegia and hemiparesis following cerebral infarction affecting right dominant side: Secondary | ICD-10-CM | POA: Diagnosis not present

## 2019-04-08 DIAGNOSIS — R2689 Other abnormalities of gait and mobility: Secondary | ICD-10-CM | POA: Diagnosis not present

## 2019-04-08 DIAGNOSIS — M6281 Muscle weakness (generalized): Secondary | ICD-10-CM

## 2019-04-08 NOTE — Therapy (Signed)
[facility] [address] [place], [place], [address] Phone: [phone]   Fax:  [phone]  Occupational Therapy Treatment  Patient Details  Name: [name] MRN: [id number] Date of Birth: 05-20-1961 Referring Provider (OT): Jamse Arn   Encounter Date: 04/08/2019  OT End of Session - 04/08/19 1403    Visit Number  13    Date for OT Re-Evaluation  04/26/19    Authorization Type  BCBS - pt has 30 VL combined with PT; pt wishes to additional 10 visits for OT, 5 additional visits for PT and reserve 5 visits.     Authorization - Visit Number  13    Authorization - Number of Visits  15    OT Start Time  3016    OT Stop Time  1245    OT Time Calculation (min)  47 min    Activity Tolerance  Patient tolerated treatment well       Past Medical History:  Diagnosis Date  . Arthritis   . CAD (coronary artery disease)    a. s/p NSTEMI in 10/2017 with cath showing LM disease --> s/p CABG on 11/23/2017 with LIMA-LAD and SVG-OM.   [name] Hay fever   . Ischemic cardiomyopathy    a. EF 35-40% by echo in 10/2017  . NSTEMI (non-ST elevated myocardial infarction) ([place])   . PAF (paroxysmal atrial fibrillation) ([place])    Diagnosed by ILR  . Pneumonia 11/16/2017  . Stroke [facility]) 08/23/2017   Left MCA infarct status post TPA and thrombectomy    Past Surgical History:  Procedure Laterality Date  . CORONARY ARTERY BYPASS GRAFT N/A 11/23/2017   Procedure: CORONARY ARTERY BYPASS GRAFTING (CABG) x 2, ON PUMP, USING LEFT INTERNAL MAMMARY ARTERY AND RIGHT GREATER SAPHENOUS VEIN HARVESTED ENDOSCOPICALLY;  Surgeon: Gaye Pollack, MD;  Location: [facility];  Service: Open Heart Surgery;  Laterality: N/A;  . CRYOABLATION     cervical  . IR ANGIO INTRA EXTRACRAN SEL COM CAROTID INNOMINATE UNI R MOD SED  08/21/2017  . IR ANGIO VERTEBRAL SEL SUBCLAVIAN INNOMINATE UNI R MOD SED  08/21/2017  . IR PERCUTANEOUS ART THROMBECTOMY/INFUSION  INTRACRANIAL INC DIAG ANGIO  08/21/2017  . IR RADIOLOGIST EVAL & MGMT  10/30/2017  . LOOP RECORDER INSERTION N/A 08/28/2017   Procedure: LOOP RECORDER INSERTION;  Surgeon: Constance Haw, MD;  Location: [facility];  Service: Cardiovascular;  Laterality: N/A;  . RADIOLOGY WITH ANESTHESIA N/A 08/21/2017   Procedure: RADIOLOGY WITH ANESTHESIA;  Surgeon: Luanne Bras, MD;  Location: [facility];  Service: Radiology;  Laterality: N/A;  . RIGHT/LEFT HEART CATH AND CORONARY ANGIOGRAPHY N/A 11/20/2017   Procedure: RIGHT/LEFT HEART CATH AND CORONARY ANGIOGRAPHY;  Surgeon: Jettie Booze, MD;  Location: [facility];  Service: Cardiovascular;  Laterality: N/A;  . TEE WITHOUT CARDIOVERSION N/A 08/28/2017   Procedure: TRANSESOPHAGEAL ECHOCARDIOGRAM (TEE);  Surgeon: Fay Records, MD;  Location: [facility];  Service: Cardiovascular;  Laterality: N/A;  . TEE WITHOUT CARDIOVERSION N/A 11/23/2017   Procedure: TRANSESOPHAGEAL ECHOCARDIOGRAM (TEE);  Surgeon: Gaye Pollack, MD;  Location: [facility];  Service: Open Heart Surgery;  Laterality: N/A;  . TUBAL LIGATION      There were no vitals filed for this visit.  Subjective Assessment - 04/08/19 1359    Subjective   I used the splint you made me to pick up things around the house and to pick up some gardening pebbles.    Pertinent History  L  MCA CVA in 07/2017 s/p TPA, thrombectomy; PMHx: loop recorder, CAD, CABG 10/2017, MI, bil knee OA    Patient Stated Goals  wants to walk better without AD; wants to be able to use R arm better, to write; less pain in L UE     Currently in Pain?  Yes    Pain Score  5     Pain Location  Knee    Pain Orientation  Right;Left    Pain Descriptors / Indicators  Sore    Pain Type  Chronic pain    Pain Onset  More than a month ago    Pain Frequency  Constant    Aggravating Factors   walking or standing too much    Pain Relieving Factors  rest, exercising in the water is easier       Patient seen for  aquatic therapy today.  Treatment took place in water 2.5-4 feet deep depending upon activity.  Pt entered the pool via steps using 2 hand rails and cues for postural alignment to bring R hip over R foot when descending stairs.  Treatment addressed functional ambulation in open water initially using only light dumb bells for UE support - pt initially required cues to stand tall and bring R hip into extension during stance on RLE - water provided immediate feedback for pt that her COG remains behind her BOS during stance on RLE, resulting in impaired postural alignment affecting balance and RUE functional use.  With cues and practice pt improved and was able to improve alignment with only intermittent cueing for reminders.  Addressed RLE hip extension and hip abduction with gentle strengthening (due to premorbid bilateral knee pain) - 15 reps x2. Supported pt in water with noodle to allow for slight knee flexion for LLE as pt had significant pain in L knee with static standing activities.  Pt able to complete activities with no pain in either knee. Transitioned pt into supine using flotation devices and utilized Bad Ragazz techniques to address core ROM, relaxation of LE's as well as to address LE strengthening for hip ab/adduction for BLE's with knee extension using only water for resistance ( 15 reps x 2), as well as BUE ab/adduction to 90* with only water for resistance ( 15 reps x2).  Utilized Bad Ragazz techniques to also address core strength as well as partial assisted sit ups against pool wall with LE"s supported on pool wall ( 10 reps x2).  Transitioned back into sitting and addressed mid reach patterns at 90* using light dumb bell for RUE - pt today able to control light dumb bell with partial submergence for first time for 15 reps x2 for chest press at 90* of flexion as well as horizontal abduction with partial submergence with only min facilitation to discourage trunk rotation with horizontal abduction.  Pt exited the pool via steps using 1 hand rail and completing step over step with distant supervision. With activity modification pt is able to experience no pain with most water activities.                       OT Short Term Goals - 04/08/19 1400      OT SHORT TERM GOAL #1   Title  Pt will be mod I with home activities program for RUE functional use - 03/14/2019    Status  Achieved      OT SHORT TERM GOAL #2   Title  Pt will demonstrate ability to  incorporate RUE into self feeding activities (LTG moved to STG)    Status  Achieved      OT SHORT TERM GOAL #3   Title  Pt will demonstrate ability to use RUE as gross assist 25% of the time in basic self care (LTG moved to STG)    Status  Achieved        OT Long Term Goals - 04/08/19 1401      OT LONG TERM GOAL #1   Title  Pt will be independent in updated HEP including water based program for RUE -04/26/19 (renewed as pt adjusted frequency)    Status  On-going      OT LONG TERM GOAL #2   Title  Pt will self-report able to complete morning ADL routine in 45 min or less.     Baseline  currently reports takes approx 1hr to perform     Status  Achieved      OT LONG TERM GOAL #3   Title  Pt will demonstrate ability to perform simple cold meal prep at mod independent.     Status  Deferred   per pt request     OT LONG TERM GOAL #4   Title  Pt will demonstrate ability to use RUE as gross assist 50% of the time within basic self care.     Status  Achieved      OT LONG TERM GOAL #5   Title  Pt will demonstrate ability for bil low reach with 50* shoulder flexion during functional activities.     Status  On-going      OT LONG TERM GOAL #6   Title  Pt will demonstrate increased grip strength of 3lb or more in RUE to assist with bil reaching task.     Baseline  8lb     Status  On-going      OT LONG TERM GOAL #7   Title  Pt will verbalize understanding for RUE positionig in bed in preparation for returning to sleeping  in bed vs recliner.     Status  Achieved      OT LONG TERM GOAL #8   Title  Pt will be able to write a simple sentence with RUE with min compensation and AE prn    Status  On-going      OT LONG TERM GOAL  #9   TITLE  Pt will demonstrate ability to incorporate RUE as gross assist during self feeding activities    Status  Achieved            Plan - 04/08/19 1401    Clinical Impression Statement  Pt continues to progress toward goals. Pt reports she used her wrist cock up splint to assist in pickng things up with her right hand.    OT Occupational Profile and History  Problem Focused Assessment - Including review of records relating to presenting problem    Occupational Profile and client history currently impacting functional performance  wife, homemaker, enjoys flowers/gardening  PMH: L MCA CVA (07/2017) s/p TPA, thrombectomy, loop recorder, CAD, CABG, cardiac debility, MI, tobacco use, HTN, bil knee OA    Occupational performance deficits (Please refer to evaluation for details):  ADL's;Leisure;IADL's;Rest and Sleep;Social Participation    Body Structure / Function / Physical Skills  ADL;Balance;UE functional use;Strength;Mobility;Tone;IADL;GMC    Clinical Decision Making  Limited treatment options, no task modification necessary    Comorbidities Affecting Occupational Performance:  May have comorbidities impacting occupational performance  OT Frequency  Other (comment)   2x/wk x1 week then 1x/wk x3 weeks   OT Duration  Other (comment)   2x/wk x 1 week then 1x wk x3 weeks   OT Treatment/Interventions  Self-care/ADL training;Therapeutic exercise;Balance training;Fluidtherapy;Manual Therapy;Neuromuscular education;Patient/family education;Therapeutic activities;Passive range of motion;Energy conservation;DME and/or AE instruction;Functional Mobility Training;Splinting;Aquatic Therapy;Moist Heat    Plan  check splint,  NMR for RUE/Trunk, low reach/functional reach,  aquatic therapy     Recommended Other Services  discussed completing PT eval with pt/pt's spouse -pt in agreeement and will schedule PT Eval    Consulted and Agree with Plan of Care  Patient       Patient will benefit from skilled therapeutic intervention in order to improve the following deficits and impairments:   Body Structure / Function / Physical Skills: ADL, Balance, UE functional use, Strength, Mobility, Tone, IADL, GMC       Visit Diagnosis: 1. Muscle weakness (generalized)   2. Unsteadiness on feet   3. Hemiplegia and hemiparesis following cerebral infarction affecting right dominant side ([place])   4. Abnormal posture   5. Other lack of coordination       Problem List Patient Active Problem List   Diagnosis Date Noted  . Carpal tunnel syndrome of left wrist 03/15/2019  . Anxiety 10/24/2018  . Numbness of left hand 10/19/2018  . Spastic hemiparesis affecting dominant side ([place]) 09/20/2018  . Primary osteoarthritis of both knees 07/04/2018  . Iron deficiency 04/23/2018  . Chronic urticaria 03/20/2018  . Atherosclerosis of aorta ([place]) 02/02/2018  . Osteoarthritis of left knee 01/16/2018  . Osteoarthritis of right knee 01/16/2018  . Sequela of cerebrovascular accident 01/16/2018  . Paroxysmal atrial fibrillation ([place]) 12/19/2017  . Chronic combined systolic and diastolic CHF (congestive heart failure) ([place]) 12/16/2017  . Benign essential HTN   . Supplemental oxygen dependent   . 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 Coronary artery disease 11/23/2017  . NSTEMI (non-ST elevated myocardial infarction) ([place]) 11/16/2017  . Former smoker 10/13/2017  . Left middle cerebral artery stroke ([place]) 08/28/2017  . Cerebrovascular accident (CVA) due to occlusion of left middle cerebral artery ([place])   . Right hemiplegia ([place])   . Dysphagia, post-stroke   . Hyperlipidemia LDL goal <70     Quay Burow, OTR/L 04/08/2019, 2:05 PM  [facility] [address] [place], [place], [address] Phone: [phone]   Fax:  [phone]  Name: [name] MRN: [id number] Date of Birth: December 17, 1960

## 2019-04-09 LAB — CUP PACEART REMOTE DEVICE CHECK
Date Time Interrogation Session: 20200714024112
Implantable Pulse Generator Implant Date: 20181203

## 2019-04-10 ENCOUNTER — Ambulatory Visit: Payer: BC Managed Care – PPO | Admitting: Occupational Therapy

## 2019-04-10 ENCOUNTER — Encounter: Payer: Self-pay | Admitting: Physical Therapy

## 2019-04-10 ENCOUNTER — Ambulatory Visit: Payer: BC Managed Care – PPO | Admitting: Physical Therapy

## 2019-04-10 ENCOUNTER — Other Ambulatory Visit: Payer: Self-pay

## 2019-04-10 DIAGNOSIS — I69351 Hemiplegia and hemiparesis following cerebral infarction affecting right dominant side: Secondary | ICD-10-CM

## 2019-04-10 DIAGNOSIS — R278 Other lack of coordination: Secondary | ICD-10-CM

## 2019-04-10 DIAGNOSIS — R2681 Unsteadiness on feet: Secondary | ICD-10-CM | POA: Diagnosis not present

## 2019-04-10 DIAGNOSIS — M6281 Muscle weakness (generalized): Secondary | ICD-10-CM

## 2019-04-10 DIAGNOSIS — R293 Abnormal posture: Secondary | ICD-10-CM | POA: Diagnosis not present

## 2019-04-10 DIAGNOSIS — R2689 Other abnormalities of gait and mobility: Secondary | ICD-10-CM

## 2019-04-10 NOTE — Patient Instructions (Signed)
Access Code: [email or web address]  URL: [email or web address]  Date: 04/10/2019  Prepared by: Jamey Reas   Exercises Seated Hamstring Stretch - 3 reps - 1 sets - 30 seconds hold - 2x daily - 7x weekly Seated Gastroc Stretch with Strap - 3 reps - 1 sets - 30 seconds hold - 2x daily - 7x weekly Seated Hamstring Stretch with Strap - 3 reps - 1 sets - 30 seconds hold - 2x daily - 7x weekly

## 2019-04-11 ENCOUNTER — Ambulatory Visit: Payer: BC Managed Care – PPO | Admitting: Physical Therapy

## 2019-04-11 NOTE — Therapy (Signed)
2122    Clinical Impression Statement  Patient met or partially met LTGs. She needs AirCast for longer & uneven terrain to protect from supination injury. Patient continues to have RLE coordination & hemiparesis dificits and OA pain/weakness issues.    Personal Factors and Comorbidities  Comorbidity 3+;Time since onset of injury/illness/exacerbation    Comorbidities  CVA 08/23/2017, CAD, NSTEMI, A-Fib, 11/23/2017 CABGx2,     Examination-Activity Limitations  Squat;Stand;Locomotion Level    Examination-Participation Restrictions  Shop;Community Activity    Rehab Potential  Good    PT Frequency  Other (comment)    PT Duration  6  weeks    PT Treatment/Interventions  Gait training;Stair training;Functional mobility training;DME Instruction;Therapeutic exercise;Therapeutic activities;Balance training;Neuromuscular re-education;Patient/family education;ADLs/Self Care Home Management;Manual techniques;Moist Heat    PT Next Visit Plan  discharge PT    PT Home Exercise Plan  see pt instructions - added hip abdct. and hamstring stretches on 03-07-19 and added hamstring/gastroc stretches    Consulted and Agree with Plan of Care  Patient       Patient will benefit from skilled therapeutic intervention in order to improve the following deficits and impairments:  Abnormal gait, Decreased range of motion, Impaired tone, Decreased balance, Decreased knowledge of use of DME, Impaired flexibility, Improper body mechanics, Decreased mobility, Postural dysfunction  Visit Diagnosis: 1. Muscle weakness (generalized)   2. Unsteadiness on feet   3. Hemiplegia and hemiparesis following cerebral infarction affecting right dominant side ([place])   4. Abnormal posture   5. Other lack of coordination   6. Other abnormalities of gait and mobility        Problem List Patient Active Problem List   Diagnosis Date Noted  . Carpal tunnel syndrome of left wrist 03/15/2019  . Anxiety 10/24/2018  . Numbness of left hand 10/19/2018  . Spastic hemiparesis affecting dominant side ([place]) 09/20/2018  . Primary osteoarthritis of both knees 07/04/2018  . Iron deficiency 04/23/2018  . Chronic urticaria 03/20/2018  . Atherosclerosis of aorta ([place]) 02/02/2018  . Osteoarthritis of left knee 01/16/2018  . Osteoarthritis of right knee 01/16/2018  . Sequela of cerebrovascular accident 01/16/2018  . Paroxysmal atrial fibrillation ([place]) 12/19/2017  . Chronic combined systolic and diastolic CHF (congestive heart failure) ([place]) 12/16/2017  . Benign essential HTN   . Supplemental oxygen dependent   . 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 Coronary artery disease  11/23/2017  . NSTEMI (non-ST elevated myocardial infarction) ([place]) 11/16/2017  . Former smoker 10/13/2017  . 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 Cerebrovascular accident (CVA) due to occlusion of left middle cerebral artery ([place])   . Right hemiplegia ([place])   . Dysphagia, post-stroke   . Hyperlipidemia LDL goal <70     [name] PT, DPT 04/11/2019, 11:26 AM  [facility] [address] [place], [place], [address] Phone: [phone]   Fax:  [phone]  Name: [name] MRN: [id number] Date of Birth: 1961/03/24  [facility] [address] [place], [place], [address] Phone: [phone]   Fax:  [phone]  Physical Therapy Treatment & Discharge Summary  Patient Details  Name: [name] MRN: [id number] Date of Birth: [birth year] Referring Provider (PT): Delice Lesch, MD   Encounter Date: 04/10/2019   CLINIC OPERATION CHANGES: Outpatient [facility] is open at lower capacity following universal masking, social distancing, and patient screening.  The patient's COVID risk of complications score is 3.  PHYSICAL THERAPY DISCHARGE SUMMARY  Visits from Start of Care: 8  Current functional level related to goals / functional outcomes: See below   Remaining deficits: See below   Education / Equipment: HEP & AirCast Plan: Patient agrees to discharge.  Patient goals were partially met. Patient is being discharged due to meeting the stated rehab goals.  ?????       PT End of Session - 04/10/19 2104    Visit Number  8    Number of Visits  8    Date for PT Re-Evaluation  04/30/19    Authorization Type  BCBS (Ded & Oop have been met. Pt has no financial responsibility). 30 visit limit for PT/OT combined.    Authorization - Visit Number  21   OT & PT combined   Authorization - Number of Visits  30    PT Start Time  1600    PT Stop Time  5427    PT Time Calculation (min)  45 min    Equipment Utilized During Treatment  Gait belt    Activity Tolerance  Patient limited by pain;Patient tolerated treatment well   c/o left knee pain in tall kneeling and quadruped position   Behavior During Therapy  WFL for tasks assessed/performed       Past Medical History:  Diagnosis Date  . Arthritis   . CAD (coronary artery disease)    a. s/p NSTEMI in 10/2017 with cath showing LM disease --> s/p CABG on 11/23/2017 with LIMA-LAD and SVG-OM.   [name] Hay fever   . Ischemic cardiomyopathy    a. EF 35-40% by echo in 10/2017  . NSTEMI (non-ST  elevated myocardial infarction) ([place])   . PAF (paroxysmal atrial fibrillation) ([place])    Diagnosed by ILR  . Pneumonia 11/16/2017  . Stroke [facility]) 08/23/2017   Left MCA infarct status post TPA and thrombectomy    Past Surgical History:  Procedure Laterality Date  . CORONARY ARTERY BYPASS GRAFT N/A 11/23/2017   Procedure: CORONARY ARTERY BYPASS GRAFTING (CABG) x 2, ON PUMP, USING LEFT INTERNAL MAMMARY ARTERY AND RIGHT GREATER SAPHENOUS VEIN HARVESTED ENDOSCOPICALLY;  Surgeon: Gaye Pollack, MD;  Location: [facility];  Service: Open Heart Surgery;  Laterality: N/A;  . CRYOABLATION     cervical  . IR ANGIO INTRA EXTRACRAN SEL COM CAROTID INNOMINATE UNI R MOD SED  08/21/2017  . IR ANGIO VERTEBRAL SEL SUBCLAVIAN INNOMINATE UNI R MOD SED  08/21/2017  . IR PERCUTANEOUS ART THROMBECTOMY/INFUSION INTRACRANIAL INC DIAG ANGIO  08/21/2017  . IR RADIOLOGIST EVAL & MGMT  10/30/2017  . LOOP RECORDER INSERTION N/A 08/28/2017   Procedure: LOOP RECORDER INSERTION;  Surgeon: Constance Haw, MD;  Location: [facility];  Service: Cardiovascular;  Laterality: N/A;  . RADIOLOGY WITH ANESTHESIA N/A 08/21/2017   Procedure: RADIOLOGY WITH ANESTHESIA;  Surgeon: Luanne Bras, MD;  Location: [facility];  Service: Radiology;  Laterality: N/A;  . RIGHT/LEFT HEART CATH AND CORONARY ANGIOGRAPHY N/A 11/20/2017   Procedure: RIGHT/LEFT  2122    Clinical Impression Statement  Patient met or partially met LTGs. She needs AirCast for longer & uneven terrain to protect from supination injury. Patient continues to have RLE coordination & hemiparesis dificits and OA pain/weakness issues.    Personal Factors and Comorbidities  Comorbidity 3+;Time since onset of injury/illness/exacerbation    Comorbidities  CVA 08/23/2017, CAD, NSTEMI, A-Fib, 11/23/2017 CABGx2,     Examination-Activity Limitations  Squat;Stand;Locomotion Level    Examination-Participation Restrictions  Shop;Community Activity    Rehab Potential  Good    PT Frequency  Other (comment)    PT Duration  6  weeks    PT Treatment/Interventions  Gait training;Stair training;Functional mobility training;DME Instruction;Therapeutic exercise;Therapeutic activities;Balance training;Neuromuscular re-education;Patient/family education;ADLs/Self Care Home Management;Manual techniques;Moist Heat    PT Next Visit Plan  discharge PT    PT Home Exercise Plan  see pt instructions - added hip abdct. and hamstring stretches on 03-07-19 and added hamstring/gastroc stretches    Consulted and Agree with Plan of Care  Patient       Patient will benefit from skilled therapeutic intervention in order to improve the following deficits and impairments:  Abnormal gait, Decreased range of motion, Impaired tone, Decreased balance, Decreased knowledge of use of DME, Impaired flexibility, Improper body mechanics, Decreased mobility, Postural dysfunction  Visit Diagnosis: 1. Muscle weakness (generalized)   2. Unsteadiness on feet   3. Hemiplegia and hemiparesis following cerebral infarction affecting right dominant side ([place])   4. Abnormal posture   5. Other lack of coordination   6. Other abnormalities of gait and mobility        Problem List Patient Active Problem List   Diagnosis Date Noted  . Carpal tunnel syndrome of left wrist 03/15/2019  . Anxiety 10/24/2018  . Numbness of left hand 10/19/2018  . Spastic hemiparesis affecting dominant side ([place]) 09/20/2018  . Primary osteoarthritis of both knees 07/04/2018  . Iron deficiency 04/23/2018  . Chronic urticaria 03/20/2018  . Atherosclerosis of aorta ([place]) 02/02/2018  . Osteoarthritis of left knee 01/16/2018  . Osteoarthritis of right knee 01/16/2018  . Sequela of cerebrovascular accident 01/16/2018  . Paroxysmal atrial fibrillation ([place]) 12/19/2017  . Chronic combined systolic and diastolic CHF (congestive heart failure) ([place]) 12/16/2017  . Benign essential HTN   . Supplemental oxygen dependent   . 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 Coronary artery disease  11/23/2017  . NSTEMI (non-ST elevated myocardial infarction) ([place]) 11/16/2017  . Former smoker 10/13/2017  . 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 Cerebrovascular accident (CVA) due to occlusion of left middle cerebral artery ([place])   . Right hemiplegia ([place])   . Dysphagia, post-stroke   . Hyperlipidemia LDL goal <70     [name] PT, DPT 04/11/2019, 11:26 AM  [facility] [address] [place], [place], [address] Phone: [phone]   Fax:  [phone]  Name: [name] MRN: [id number] Date of Birth: 1961/03/24  [facility] [address] [place], [place], [address] Phone: [phone]   Fax:  [phone]  Physical Therapy Treatment & Discharge Summary  Patient Details  Name: [name] MRN: [id number] Date of Birth: [birth year] Referring Provider (PT): Delice Lesch, MD   Encounter Date: 04/10/2019   CLINIC OPERATION CHANGES: Outpatient [facility] is open at lower capacity following universal masking, social distancing, and patient screening.  The patient's COVID risk of complications score is 3.  PHYSICAL THERAPY DISCHARGE SUMMARY  Visits from Start of Care: 8  Current functional level related to goals / functional outcomes: See below   Remaining deficits: See below   Education / Equipment: HEP & AirCast Plan: Patient agrees to discharge.  Patient goals were partially met. Patient is being discharged due to meeting the stated rehab goals.  ?????       PT End of Session - 04/10/19 2104    Visit Number  8    Number of Visits  8    Date for PT Re-Evaluation  04/30/19    Authorization Type  BCBS (Ded & Oop have been met. Pt has no financial responsibility). 30 visit limit for PT/OT combined.    Authorization - Visit Number  21   OT & PT combined   Authorization - Number of Visits  30    PT Start Time  1600    PT Stop Time  5427    PT Time Calculation (min)  45 min    Equipment Utilized During Treatment  Gait belt    Activity Tolerance  Patient limited by pain;Patient tolerated treatment well   c/o left knee pain in tall kneeling and quadruped position   Behavior During Therapy  WFL for tasks assessed/performed       Past Medical History:  Diagnosis Date  . Arthritis   . CAD (coronary artery disease)    a. s/p NSTEMI in 10/2017 with cath showing LM disease --> s/p CABG on 11/23/2017 with LIMA-LAD and SVG-OM.   [name] Hay fever   . Ischemic cardiomyopathy    a. EF 35-40% by echo in 10/2017  . NSTEMI (non-ST  elevated myocardial infarction) ([place])   . PAF (paroxysmal atrial fibrillation) ([place])    Diagnosed by ILR  . Pneumonia 11/16/2017  . Stroke [facility]) 08/23/2017   Left MCA infarct status post TPA and thrombectomy    Past Surgical History:  Procedure Laterality Date  . CORONARY ARTERY BYPASS GRAFT N/A 11/23/2017   Procedure: CORONARY ARTERY BYPASS GRAFTING (CABG) x 2, ON PUMP, USING LEFT INTERNAL MAMMARY ARTERY AND RIGHT GREATER SAPHENOUS VEIN HARVESTED ENDOSCOPICALLY;  Surgeon: Gaye Pollack, MD;  Location: [facility];  Service: Open Heart Surgery;  Laterality: N/A;  . CRYOABLATION     cervical  . IR ANGIO INTRA EXTRACRAN SEL COM CAROTID INNOMINATE UNI R MOD SED  08/21/2017  . IR ANGIO VERTEBRAL SEL SUBCLAVIAN INNOMINATE UNI R MOD SED  08/21/2017  . IR PERCUTANEOUS ART THROMBECTOMY/INFUSION INTRACRANIAL INC DIAG ANGIO  08/21/2017  . IR RADIOLOGIST EVAL & MGMT  10/30/2017  . LOOP RECORDER INSERTION N/A 08/28/2017   Procedure: LOOP RECORDER INSERTION;  Surgeon: Constance Haw, MD;  Location: [facility];  Service: Cardiovascular;  Laterality: N/A;  . RADIOLOGY WITH ANESTHESIA N/A 08/21/2017   Procedure: RADIOLOGY WITH ANESTHESIA;  Surgeon: Luanne Bras, MD;  Location: [facility];  Service: Radiology;  Laterality: N/A;  . RIGHT/LEFT HEART CATH AND CORONARY ANGIOGRAPHY N/A 11/20/2017   Procedure: RIGHT/LEFT

## 2019-04-15 ENCOUNTER — Encounter: Payer: Self-pay | Admitting: Occupational Therapy

## 2019-04-15 ENCOUNTER — Ambulatory Visit: Payer: BC Managed Care – PPO | Admitting: Occupational Therapy

## 2019-04-15 ENCOUNTER — Other Ambulatory Visit: Payer: Self-pay

## 2019-04-15 DIAGNOSIS — M6281 Muscle weakness (generalized): Secondary | ICD-10-CM | POA: Diagnosis not present

## 2019-04-15 DIAGNOSIS — R278 Other lack of coordination: Secondary | ICD-10-CM

## 2019-04-15 DIAGNOSIS — R2681 Unsteadiness on feet: Secondary | ICD-10-CM

## 2019-04-15 DIAGNOSIS — R293 Abnormal posture: Secondary | ICD-10-CM

## 2019-04-15 DIAGNOSIS — I69351 Hemiplegia and hemiparesis following cerebral infarction affecting right dominant side: Secondary | ICD-10-CM | POA: Diagnosis not present

## 2019-04-15 DIAGNOSIS — R2689 Other abnormalities of gait and mobility: Secondary | ICD-10-CM | POA: Diagnosis not present

## 2019-04-15 NOTE — Therapy (Signed)
[facility] [address] [place], [place], [address] Phone: [phone]   Fax:  [phone]  Occupational Therapy Treatment  Patient Details  Name: [name] MRN: [id number] Date of Birth: 1960-10-14 Referring Provider (OT): Jamse Arn   Encounter Date: 04/15/2019  OT End of Session - 04/15/19 2025    Visit Number  14    Number of Visits  15    Date for OT Re-Evaluation  04/26/19    Authorization Type  [employer] - pt has 30 VL combined with PT; pt wishes to additional 10 visits for OT, 5 additional visits for PT and reserve 5 visits.     Authorization - Visit Number  14    Authorization - Number of Visits  15    OT Start Time  1202    OT Stop Time  1245    OT Time Calculation (min)  43 min    Activity Tolerance  Patient tolerated treatment well       Past Medical History:  Diagnosis Date  . Arthritis   . CAD (coronary artery disease)    a. s/p NSTEMI in 10/2017 with cath showing LM disease --> s/p CABG on 11/23/2017 with LIMA-LAD and SVG-OM.   [name] Hay fever   . Ischemic cardiomyopathy    a. EF 35-40% by echo in 10/2017  . NSTEMI (non-ST elevated myocardial infarction) ([place])   . PAF (paroxysmal atrial fibrillation) ([place])    Diagnosed by ILR  . Pneumonia 11/16/2017  . Stroke [facility]) 08/23/2017   Left MCA infarct status post TPA and thrombectomy    Past Surgical History:  Procedure Laterality Date  . CORONARY ARTERY BYPASS GRAFT N/A 11/23/2017   Procedure: CORONARY ARTERY BYPASS GRAFTING (CABG) x 2, ON PUMP, USING LEFT INTERNAL MAMMARY ARTERY AND RIGHT GREATER SAPHENOUS VEIN HARVESTED ENDOSCOPICALLY;  Surgeon: Gaye Pollack, MD;  Location: [facility];  Service: Open Heart Surgery;  Laterality: N/A;  . CRYOABLATION     cervical  . IR ANGIO INTRA EXTRACRAN SEL COM CAROTID INNOMINATE UNI R MOD SED  08/21/2017  . IR ANGIO VERTEBRAL SEL SUBCLAVIAN INNOMINATE UNI R MOD SED  08/21/2017  . IR PERCUTANEOUS ART  THROMBECTOMY/INFUSION INTRACRANIAL INC DIAG ANGIO  08/21/2017  . IR RADIOLOGIST EVAL & MGMT  10/30/2017  . LOOP RECORDER INSERTION N/A 08/28/2017   Procedure: LOOP RECORDER INSERTION;  Surgeon: Constance Haw, MD;  Location: [facility];  Service: Cardiovascular;  Laterality: N/A;  . RADIOLOGY WITH ANESTHESIA N/A 08/21/2017   Procedure: RADIOLOGY WITH ANESTHESIA;  Surgeon: Luanne Bras, MD;  Location: [facility];  Service: Radiology;  Laterality: N/A;  . RIGHT/LEFT HEART CATH AND CORONARY ANGIOGRAPHY N/A 11/20/2017   Procedure: RIGHT/LEFT HEART CATH AND CORONARY ANGIOGRAPHY;  Surgeon: Jettie Booze, MD;  Location: [facility];  Service: Cardiovascular;  Laterality: N/A;  . TEE WITHOUT CARDIOVERSION N/A 08/28/2017   Procedure: TRANSESOPHAGEAL ECHOCARDIOGRAM (TEE);  Surgeon: Fay Records, MD;  Location: [facility];  Service: Cardiovascular;  Laterality: N/A;  . TEE WITHOUT CARDIOVERSION N/A 11/23/2017   Procedure: TRANSESOPHAGEAL ECHOCARDIOGRAM (TEE);  Surgeon: Gaye Pollack, MD;  Location: [facility];  Service: Open Heart Surgery;  Laterality: N/A;  . TUBAL LIGATION      There were no vitals filed for this visit.  Subjective Assessment - 04/15/19 2020    Subjective   I like these water exercises - I can tell I am stronger    Pertinent History  L MCA  CVA in 07/2017 s/p TPA, thrombectomy; PMHx: loop recorder, CAD, CABG 10/2017, MI, bil knee OA    Patient Stated Goals  wants to walk better without AD; wants to be able to use R arm better, to write; less pain in L UE     Pain Score  4     Pain Location  Knee    Pain Orientation  Right;Left    Pain Descriptors / Indicators  Sore;Aching    Pain Type  Chronic pain    Pain Onset  More than a month ago    Pain Frequency  Constant    Aggravating Factors   arthritis, standing still, walking too much    Pain Relieving Factors  resting, shots help, it is easier to walk and exerise in the water       Patient seen for aquatic  therapy today.  Treatment took place in water 2.5-4 feet deep depending upon activity.  Pt entered the pool via steps using 2 hand rails and intermittent min a for RLE foot placement when descending stairs, cues for postural alignment.  Pt has two remaining visits left for OT and due to cognitive impairment will need repetition and increased time/education for aquatic HEP.  Utilized this session to introduce aquatic activities that pt can incorporate into HEP with supervision/occassional min a. Pt states her brother or husband are always in the pool with her (pt has pool in her back yard).  HEP included:  Functional ambulation in open water with light UE support in order to activate RUE during ambulation, increase activation of entire trunk, increase weight shift onto RLE and focus on more normalized gait pattern and speed to decrease risk of falls.  Also included: UE exercises (seated) for chest presses, horizontal ab/adduction 15 reps x2; Standing with back against pool wall for support to address elbow flexion/extension 10 reps x2.  All UE exercises will have partial to full submersion of small piece of small noodle for added resistance. Addressed core strength with back against pool wall for support and rotating upper trunk on fixed lower trunk for 15 reps with BUEs extended and using small noodle for light resistance. Transitioned into supine (pt states she has waist belt at home and would like to purchase head support).  In supine addressed ab/adduction of BUE's (15 reps x2), as well as ab/adduction of BLE"s (10 reps x2). Pt able to complete LE's without pain.  Addressed core strength using [place] techniques for core stabilization as well as relaxation.  Transitioned back into standing and pt exited the pool via steps using 2 hand rails and distant supervision.  Will review HEP next session as well for reinforcement and provide HEP in writing which pt plans to laminate to use pool side. Also provided very  basic education on water principles so that pt can adjust HEP depending upon her pain that day as well as what she wishes to focus on - pt will need reinforcement of this as well.                       OT Short Term Goals - 04/15/19 2021      OT SHORT TERM GOAL #1   Title  Pt will be mod I with home activities program for RUE functional use - 03/14/2019    Status  Achieved      OT SHORT TERM GOAL #2   Title  Pt will demonstrate ability to incorporate RUE into self feeding  activities (LTG moved to STG)    Status  Achieved      OT SHORT TERM GOAL #3   Title  Pt will demonstrate ability to use RUE as gross assist 25% of the time in basic self care (LTG moved to STG)    Status  Achieved        OT Long Term Goals - 04/15/19 2022      OT LONG TERM GOAL #1   Title  Pt will be independent in updated HEP including water based program for RUE -04/26/19 (renewed as pt adjusted frequency)    Status  On-going      OT LONG TERM GOAL #2   Title  Pt will self-report able to complete morning ADL routine in 45 min or less.     Baseline  currently reports takes approx 1hr to perform     Status  Achieved      OT LONG TERM GOAL #3   Title  Pt will demonstrate ability to perform simple cold meal prep at mod independent.     Status  Deferred   per pt request     OT LONG TERM GOAL #4   Title  Pt will demonstrate ability to use RUE as gross assist 50% of the time within basic self care.     Status  Achieved      OT LONG TERM GOAL #5   Title  Pt will demonstrate ability for bil low reach with 50* shoulder flexion during functional activities.     Status  Achieved      OT LONG TERM GOAL #6   Title  Pt will demonstrate increased grip strength of 3lb or more in RUE to assist with bil reaching task.     Baseline  8lb     Status  Not Met   per dynamometer however pt can use L hand functionally     OT LONG TERM GOAL #7   Title  Pt will verbalize understanding for RUE positionig  in bed in preparation for returning to sleeping in bed vs recliner.     Status  Achieved      OT LONG TERM GOAL #8   Title  Pt will be able to write a simple sentence with RUE with min compensation and AE prn    Status  Achieved      OT LONG TERM GOAL  #9   TITLE  Pt will demonstrate ability to incorporate RUE as gross assist during self feeding activities    Status  Achieved            Plan - 04/15/19 2023    Clinical Impression Statement  Pt progressing toward goals. WIth cues, pt is able to use RUE as gross assist for numerous bilateral activities during therapy session    OT Occupational Profile and History  Problem Focused Assessment - Including review of records relating to presenting problem    Occupational Profile and client history currently impacting functional performance  wife, homemaker, enjoys flowers/gardening  PMH: L MCA CVA (07/2017) s/p TPA, thrombectomy, loop recorder, CAD, CABG, cardiac debility, MI, tobacco use, HTN, bil knee OA    Occupational performance deficits (Please refer to evaluation for details):  ADL's;Leisure;IADL's;Rest and Sleep;Social Participation    Body Structure / Function / Physical Skills  ADL;Balance;UE functional use;Strength;Mobility;Tone;IADL;GMC    Rehab Potential  Good    Clinical Decision Making  Limited treatment options, no task modification necessary    Comorbidities Affecting Occupational Performance:  May have comorbidities impacting occupational performance    OT Frequency  Other (comment)   2x/wk x1 week then 1x/wk x3 weeks   OT Duration  Other (comment)   2x/wk x 1 week then 1x/wk x3 weeks   OT Treatment/Interventions  Self-care/ADL training;Therapeutic exercise;Balance training;Fluidtherapy;Manual Therapy;Neuromuscular education;Patient/family education;Therapeutic activities;Passive range of motion;Energy conservation;DME and/or AE instruction;Functional Mobility Training;Splinting;Aquatic Therapy;Moist Heat    Plan  complete  aquatic HEP education and then d/c from OT    Consulted and Agree with Plan of Care  Patient       Patient will benefit from skilled therapeutic intervention in order to improve the following deficits and impairments:   Body Structure / Function / Physical Skills: ADL, Balance, UE functional use, Strength, Mobility, Tone, IADL, GMC       Visit Diagnosis: 1. Muscle weakness (generalized)   2. Unsteadiness on feet   3. Hemiplegia and hemiparesis following cerebral infarction affecting right dominant side ([place])   4. Abnormal posture   5. Other lack of coordination       Problem List Patient Active Problem List   Diagnosis Date Noted  . Carpal tunnel syndrome of left wrist 03/15/2019  . Anxiety 10/24/2018  . Numbness of left hand 10/19/2018  . Spastic hemiparesis affecting dominant side ([place]) 09/20/2018  . Primary osteoarthritis of both knees 07/04/2018  . Iron deficiency 04/23/2018  . Chronic urticaria 03/20/2018  . Atherosclerosis of aorta ([place]) 02/02/2018  . Osteoarthritis of left knee 01/16/2018  . Osteoarthritis of right knee 01/16/2018  . Sequela of cerebrovascular accident 01/16/2018  . Paroxysmal atrial fibrillation ([place]) 12/19/2017  . Chronic combined systolic and diastolic CHF (congestive heart failure) ([place]) 12/16/2017  . Benign essential HTN   . Supplemental oxygen dependent   . 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 Coronary artery disease 11/23/2017  . NSTEMI (non-ST elevated myocardial infarction) ([place]) 11/16/2017  . Former smoker 10/13/2017  . Left middle cerebral artery stroke ([place]) 08/28/2017  . Cerebrovascular accident (CVA) due to occlusion of left middle cerebral artery ([place])   . Right hemiplegia ([place])   . Dysphagia, post-stroke   . Hyperlipidemia LDL goal <70     Quay Burow, OTR/L 04/15/2019, 8:26 PM  [facility] [address] [place], [place], [address] Phone: [phone]   Fax:   [phone]  Name: [name] MRN: [id number] Date of Birth: [birth year]

## 2019-04-17 ENCOUNTER — Ambulatory Visit: Payer: BC Managed Care – PPO | Admitting: Physical Therapy

## 2019-04-17 ENCOUNTER — Encounter: Payer: BC Managed Care – PPO | Admitting: Occupational Therapy

## 2019-04-18 ENCOUNTER — Ambulatory Visit: Payer: BC Managed Care – PPO | Admitting: Physical Therapy

## 2019-04-18 NOTE — Progress Notes (Signed)
[facility] Summary Report / Loop Recorder 

## 2019-04-22 ENCOUNTER — Other Ambulatory Visit: Payer: Self-pay

## 2019-04-22 ENCOUNTER — Encounter: Payer: Self-pay | Admitting: Occupational Therapy

## 2019-04-22 ENCOUNTER — Ambulatory Visit: Payer: BC Managed Care – PPO | Admitting: Occupational Therapy

## 2019-04-22 DIAGNOSIS — R278 Other lack of coordination: Secondary | ICD-10-CM | POA: Diagnosis not present

## 2019-04-22 DIAGNOSIS — R293 Abnormal posture: Secondary | ICD-10-CM | POA: Diagnosis not present

## 2019-04-22 DIAGNOSIS — R2681 Unsteadiness on feet: Secondary | ICD-10-CM | POA: Diagnosis not present

## 2019-04-22 DIAGNOSIS — R2689 Other abnormalities of gait and mobility: Secondary | ICD-10-CM | POA: Diagnosis not present

## 2019-04-22 DIAGNOSIS — M6281 Muscle weakness (generalized): Secondary | ICD-10-CM

## 2019-04-22 DIAGNOSIS — I69351 Hemiplegia and hemiparesis following cerebral infarction affecting right dominant side: Secondary | ICD-10-CM

## 2019-04-22 NOTE — Patient Instructions (Signed)
[facility] exercise pool program   04/22/2019 You should always have someone in the water with you for safety. Some of these activities may be easier if you have someone to help you.  You will need a small noodle cut into 2 one foot sections for each hand. 1. While holding the small noodle sections in each hand, walk in the shallow section (3 ft) back and forth, taking breaks as needed for knee pain and for resting.  Focus on actively holding the noodle sections out in front of you. Also focus on keeping your right leg active - if it is "floating" it isn't active. Step on it like you mean and make sure you get all your weight on it before stepping with your left leg.  Take normal size steps. If you want a little more resistance you can either walk in slightly deeper water or walk a little faster.  2. Sit on the lowest step if possible to do these arm exercises.  Make sure your feet are on the pool floor and you tighten your tummy so you don't "float" when you do them: - Hold the noodle sections and submerge them just under the water. Then push the noodles sections out until your elbows are straight and then pull them back toward your chest. Do 10, rest then do 10 more.   - Hold the noodle sections and submerge them just under the water. Push the noodles out until your elbows are straight. Then, KEEPING YOUR ELBOWS STRAIGHT, reach out to your sides and then bring the noodle sections back in front of you (straight arms the whole time). Do 10, rest then do 10 more.  3. Stand with your back against the pool and put your feet where they feel comfortable. Keep your back against the pool wall. Hold your noodle sections in both hands.  Straighten your arms. Let the noodles rest on top of the water. KEEPING YOUR HIPS pointing straight forward, rotate from side to side with your noodle sections. You will be twisting your upper body from side to side while holding your lower body still. Do 10 to each  side. 4. Stand with your back against the pool, feet where they feel comfortable. Hold the noodle sections in each hand with your palms facing each other and your elbows bent.  Try and push the noodle to touch the wall by straightening your elbow then bend your elbow slowly to bring the noodle back up - don't let the noodle pop up - try and control it. You may need a little help holding the noodle for this one. 5. Using flotations devices lay on your back. Start with your arms by your side next to your body. Then bring them out to the side and then back in. You will need someone to steady you to keep you from moving in the water. Do 10, rest then do 10 more.  6. While still laying on your back, start with your feet together and legs relaxed. SLOWLY bring your legs out to the side and then back together again. Do 10. IF YOU HAVE NO KNEE PAIN after you rest, you can do 10 more. You will need someone to steady you so you won't move in the water.  7. Take 5 minutes to float on your back. This will help your muscles relax and your arms and legs from being too stiff.  Pay attention to your posture when you come in and  out of the pool. Use BOTH hands on the railing to keep the right arm active.  You may need to adjust which leg you come up and down with first based on your knee pain. Go slow and be safe.   Have fun with water activities - anything is ok as long as it does not cause pain and is safe!  USE YOUR RIGHT HAND AS MUCH AS YOU CAN in and out of the water! Good luck - it has been a pleasure to work with you!!!

## 2019-04-22 NOTE — Therapy (Addendum)
[facility] [address] [place], [place], [address] Phone: [phone]   Fax:  [phone]  Occupational Therapy Treatment  Patient Details  Name: [name] MRN: [id number] Date of Birth: July 27, 1961 Referring Provider (OT): Jamse Arn   Encounter Date: 04/22/2019  OT End of Session - 04/22/19 1434    Visit Number  15    Number of Visits  15    Date for OT Re-Evaluation  04/26/19    Authorization Type  [employer] - pt has 30 VL combined with PT; pt wishes to additional 10 visits for OT, 5 additional visits for PT and reserve 5 visits.     Authorization - Visit Number  15    Authorization - Number of Visits  15    OT Start Time  1959    OT Stop Time  1344    OT Time Calculation (min)  48 min    Activity Tolerance  Patient tolerated treatment well       Past Medical History:  Diagnosis Date  . Arthritis   . CAD (coronary artery disease)    a. s/p NSTEMI in 10/2017 with cath showing LM disease --> s/p CABG on 11/23/2017 with LIMA-LAD and SVG-OM.   [name] Hay fever   . Ischemic cardiomyopathy    a. EF 35-40% by echo in 10/2017  . NSTEMI (non-ST elevated myocardial infarction) ([place])   . PAF (paroxysmal atrial fibrillation) ([place])    Diagnosed by ILR  . Pneumonia 11/16/2017  . 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 CORONARY ARTERY BYPASS GRAFT N/A 11/23/2017   Procedure: CORONARY ARTERY BYPASS GRAFTING (CABG) x 2, ON PUMP, USING LEFT INTERNAL MAMMARY ARTERY AND RIGHT GREATER SAPHENOUS VEIN HARVESTED ENDOSCOPICALLY;  Surgeon: Gaye Pollack, MD;  Location: [facility];  Service: Open Heart Surgery;  Laterality: N/A;  . CRYOABLATION     cervical  . IR ANGIO INTRA EXTRACRAN SEL COM CAROTID INNOMINATE UNI R MOD SED  08/21/2017  . IR ANGIO VERTEBRAL SEL SUBCLAVIAN INNOMINATE UNI R MOD SED  08/21/2017  . IR PERCUTANEOUS ART  THROMBECTOMY/INFUSION INTRACRANIAL INC DIAG ANGIO  08/21/2017  . IR RADIOLOGIST EVAL & MGMT  10/30/2017  . LOOP RECORDER INSERTION N/A 08/28/2017   Procedure: LOOP RECORDER INSERTION;  Surgeon: Constance Haw, MD;  Location: [place] [facility];  Service: Cardiovascular;  Laterality: N/A;  . RADIOLOGY WITH ANESTHESIA N/A 08/21/2017   Procedure: RADIOLOGY WITH ANESTHESIA;  Surgeon: Luanne Bras, MD;  Location: [facility];  Service: Radiology;  Laterality: N/A;  . RIGHT/LEFT HEART CATH AND CORONARY ANGIOGRAPHY N/A 11/20/2017   Procedure: RIGHT/LEFT HEART CATH AND CORONARY ANGIOGRAPHY;  Surgeon: Jettie Booze, MD;  Location: [facility];  Service: Cardiovascular;  Laterality: N/A;  . TEE WITHOUT CARDIOVERSION N/A 08/28/2017   Procedure: TRANSESOPHAGEAL ECHOCARDIOGRAM (TEE);  Surgeon: Fay Records, MD;  Location: [facility];  Service: Cardiovascular;  Laterality: N/A;  . TEE WITHOUT CARDIOVERSION N/A 11/23/2017   Procedure: TRANSESOPHAGEAL ECHOCARDIOGRAM (TEE);  Surgeon: Gaye Pollack, MD;  Location: [facility];  Service: Open Heart Surgery;  Laterality: N/A;  . TUBAL LIGATION      There were no vitals filed for this visit.  Subjective Assessment - 04/22/19 1430    Subjective   I am sad this is our last day I feel like I get alot out of OT  Pertinent History  L MCA CVA in 07/2017 s/p TPA, thrombectomy; PMHx: loop recorder, CAD, CABG 10/2017, MI, bil knee OA    Patient Stated Goals  wants to walk better without AD; wants to be able to use R arm better, to write; less pain in L UE     Currently in Pain?  Yes    Pain Score  3     Pain Location  Knee    Pain Orientation  Right    Pain Descriptors / Indicators  Sore    Pain Type  Chronic pain    Pain Onset  More than a month ago    Pain Frequency  Constant    Aggravating Factors   arthritis, standing still, walking too much    Pain Relieving Factors  rest, shots, it's easier to walk  and exercise in the water         Patient  seen for aquatic therapy today.  Treatment took place in water 2.5-4 feet deep depending upon activity.  Pt entered the pool via steps using 2 hand rails and distant supervision.  Treatment focused on further education regarding pool HEP.  Pt benefits from repetition due to cognitive deficits from stroke. Reviewed all activities and answered all questions. Pt able to return demonstrate all activities after instruction and practice. Strongly recommended that pt has supervision in the pool and pt states "I am never in there unless a family member is with me."  Will mail pt's aquatic HEP to pt's home and recommended that pt laminate this so she can use poolside. Pt verbalized understanding. Pt exited the pool via steps using 2 hand rails and distant supervision.                     OT Education - 04/22/19 1739    Education Details  aquatic HEP    Person(s) Educated  Patient    Methods  Explanation;Demonstration;Handout    Comprehension  Verbalized understanding;Returned demonstration       OT Short Term Goals - 04/22/19 1432      OT SHORT TERM GOAL #1   Title  Pt will be mod I with home activities program for RUE functional use - 03/14/2019    Status  Achieved      OT SHORT TERM GOAL #2   Title  Pt will demonstrate ability to incorporate RUE into self feeding activities (LTG moved to STG)    Status  Achieved      OT SHORT TERM GOAL #3   Title  Pt will demonstrate ability to use RUE as gross assist 25% of the time in basic self care (LTG moved to STG)    Status  Achieved        OT Long Term Goals - 04/22/19 1432      OT LONG TERM GOAL #1   Title  Pt will be independent in updated HEP including water based program for RUE -04/26/19 (renewed as pt adjusted frequency)    Status  Achieved      OT LONG TERM GOAL #2   Title  Pt will self-report able to complete morning ADL routine in 45 min or less.     Baseline  currently reports takes approx 1hr to perform     Status   Achieved      OT LONG TERM GOAL #3   Title  Pt will demonstrate ability to perform simple cold meal prep at mod independent.     Status  Deferred   per pt request     OT LONG TERM GOAL #4   Title  Pt will demonstrate ability to use RUE as gross assist 50% of the time within basic self care.     Status  Achieved      OT LONG TERM GOAL #5   Title  Pt will demonstrate ability for bil low reach with 50* shoulder flexion during functional activities.     Status  Achieved      OT LONG TERM GOAL #6   Title  Pt will demonstrate increased grip strength of 3lb or more in RUE to assist with bil reaching task.     Baseline  8lb     Status  Not Met   per dynamometer however pt can use L hand functionally     OT LONG TERM GOAL #7   Title  Pt will verbalize understanding for RUE positionig in bed in preparation for returning to sleeping in bed vs recliner.     Status  Achieved      OT LONG TERM GOAL #8   Title  Pt will be able to write a simple sentence with RUE with min compensation and AE prn    Status  Achieved      OT LONG TERM GOAL  #9   TITLE  Pt will demonstrate ability to incorporate RUE as gross assist during self feeding activities    Status  Achieved            Plan - 04/22/19 1432    Clinical Impression Statement  Pt has progressed well and has met all but 1 LTG.  Pt has limited visits and wishes to reserve some for later in the year. Pt to be placed on hold and possibly return in November.    OT Occupational Profile and History  Problem Focused Assessment - Including review of records relating to presenting problem    Occupational Profile and client history currently impacting functional performance  wife, homemaker, enjoys flowers/gardening  PMH: L MCA CVA (07/2017) s/p TPA, thrombectomy, loop recorder, CAD, CABG, cardiac debility, MI, tobacco use, HTN, bil knee OA    Occupational performance deficits (Please refer to evaluation for details):  ADL's;Leisure;IADL's;Rest and  Sleep;Social Participation    Body Structure / Function / Physical Skills  ADL;Balance;UE functional use;Strength;Mobility;Tone;IADL;GMC    Rehab Potential  Good    Clinical Decision Making  Limited treatment options, no task modification necessary    Comorbidities Affecting Occupational Performance:  May have comorbidities impacting occupational performance    OT Frequency  Other (comment)   2x/wk x1 wk then 1x/wk x3 weeks   OT Duration  Other (comment)   2x/wk x1 week then 1x/wk x3 weeks   OT Treatment/Interventions  Self-care/ADL training;Therapeutic exercise;Balance training;Fluidtherapy;Manual Therapy;Neuromuscular education;Patient/family education;Therapeutic activities;Passive range of motion;Energy conservation;DME and/or AE instruction;Functional Mobility Training;Splinting;Aquatic Therapy;Moist Heat    Plan  place on hold as pt iwishes to reserve some therapy visits for later in the calendar year    Consulted and Agree with Plan of Care  Patient       Patient will benefit from skilled therapeutic intervention in order to improve the following deficits and impairments:   Body Structure / Function / Physical Skills: ADL, Balance, UE functional use, Strength, Mobility, Tone, IADL, GMC       Visit Diagnosis: 1. Muscle weakness (generalized)   2. Unsteadiness on feet   3. Hemiplegia and hemiparesis following cerebral infarction affecting right dominant side ([place])  4. Abnormal posture   5. Other lack of coordination       Problem List Patient Active Problem List   Diagnosis Date Noted  . Carpal tunnel syndrome of left wrist 03/15/2019  . Anxiety 10/24/2018  . Numbness of left hand 10/19/2018  . Spastic hemiparesis affecting dominant side ([place]) 09/20/2018  . Primary osteoarthritis of both knees 07/04/2018  . Iron deficiency 04/23/2018  . Chronic urticaria 03/20/2018  . Atherosclerosis of aorta ([place]) 02/02/2018  . Osteoarthritis of left knee 01/16/2018  . Osteoarthritis  of right knee 01/16/2018  . Sequela of cerebrovascular accident 01/16/2018  . Paroxysmal atrial fibrillation ([place]) 12/19/2017  . Chronic combined systolic and diastolic CHF (congestive heart failure) ([place]) 12/16/2017  . Benign essential HTN   . Supplemental oxygen dependent   . 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 Coronary artery disease 11/23/2017  . NSTEMI (non-ST elevated myocardial infarction) ([place]) 11/16/2017  . Former smoker 10/13/2017  . Left middle cerebral artery stroke ([place]) 08/28/2017  . Cerebrovascular accident (CVA) due to occlusion of left middle cerebral artery ([place])   . Right hemiplegia ([place])   . Dysphagia, post-stroke   . Hyperlipidemia LDL goal <70     Quay Burow, OTR/L 04/22/2019, 5:40 PM  [facility] [address] [place], [place], [address] Phone: [phone]   Fax:  [phone]  Name: [name] MRN: [id number] Date of Birth: 1961/09/25

## 2019-04-23 ENCOUNTER — Ambulatory Visit: Payer: BLUE CROSS/BLUE SHIELD | Admitting: Cardiology

## 2019-05-12 LAB — CUP PACEART REMOTE DEVICE CHECK
Date Time Interrogation Session: 20200816023825
Implantable Pulse Generator Implant Date: 20181203

## 2019-05-13 ENCOUNTER — Ambulatory Visit (INDEPENDENT_AMBULATORY_CARE_PROVIDER_SITE_OTHER): Payer: BC Managed Care – PPO | Admitting: *Deleted

## 2019-05-13 ENCOUNTER — Other Ambulatory Visit: Payer: Self-pay | Admitting: Cardiology

## 2019-05-13 DIAGNOSIS — I63512 Cerebral infarction due to unspecified occlusion or stenosis of left middle cerebral artery: Secondary | ICD-10-CM

## 2019-05-14 ENCOUNTER — Other Ambulatory Visit: Payer: Self-pay

## 2019-05-14 ENCOUNTER — Encounter: Payer: Self-pay | Admitting: Physical Medicine & Rehabilitation

## 2019-05-14 ENCOUNTER — Encounter
Payer: BC Managed Care – PPO | Attending: Physical Medicine & Rehabilitation | Admitting: Physical Medicine & Rehabilitation

## 2019-05-14 VITALS — BP 98/60 | HR 97 | Temp 98.5°F | Ht 61.0 in | Wt 127.0 lb

## 2019-05-14 DIAGNOSIS — R21 Rash and other nonspecific skin eruption: Secondary | ICD-10-CM

## 2019-05-14 DIAGNOSIS — R269 Unspecified abnormalities of gait and mobility: Secondary | ICD-10-CM | POA: Insufficient documentation

## 2019-05-14 DIAGNOSIS — G5602 Carpal tunnel syndrome, left upper limb: Secondary | ICD-10-CM | POA: Diagnosis not present

## 2019-05-14 DIAGNOSIS — G8110 Spastic hemiplegia affecting unspecified side: Secondary | ICD-10-CM | POA: Diagnosis not present

## 2019-05-14 DIAGNOSIS — R200 Anesthesia of skin: Secondary | ICD-10-CM

## 2019-05-14 DIAGNOSIS — M170 Bilateral primary osteoarthritis of knee: Secondary | ICD-10-CM | POA: Insufficient documentation

## 2019-05-14 MED ORDER — GABAPENTIN 100 MG PO CAPS: 100.0000 mg | ORAL_CAPSULE | Freq: Every day | ORAL | 1 refills | Status: DC

## 2019-05-14 NOTE — Progress Notes (Signed)
Subjective:    Patient ID: [name], female    DOB: 03/13/1961, 58 y.o.   MRN: [id number]  HPI Right-handed female with unremarkable past history except tobacco abuse, on no prescription medication, left MCA infarct status post TPA and thrombectomy presents for follow up for cardiac debility s/p CABG and stroke.    Last clinic visit 03/15/2019.  Since that time, patient states she continues outpatient therapies. She continues to take Baclofen. She no longer requires a cane.  Denies falls. She did not receive much benefit with last set of injection.  She continues to have rash. She has not followed up with Derm. She is using the brace on her hand.  Denies falls.  Pain Inventory Average Pain 7 Pain Right Now 7 My pain is aching  In the last 24 hours, has pain interfered with the following? General activity 7 Relation with others 9 Enjoyment of life 10 What TIME of day is your pain at its worst? morning Sleep (in general) Poor  Pain is worse with: standing Pain improves with: medication and injections Relief from Meds: no pain meds  Mobility walk with assistance use a cane ability to climb steps?  yes do you drive?  yes Do you have any goals in this area?  no  Function retired I need assistance with the following:  meal prep, household duties and shopping  Neuro/Psych trouble walking  Prior Studies Any changes since last visit?  no  Physicians involved in your care Any changes since last visit?  no   Family History  Problem Relation Age of Onset  . Heart attack Father   . Hypertension Mother   . Hyperlipidemia Mother   . Diabetes type II Mother   . Hypertension Brother   . Breast cancer Cousin 20   Social History   Socioeconomic History  . Marital status: Married    Spouse name: Not on file  . Number of children: 2  . Years of education: Not on file  . Highest education level: Not on file  Occupational History  . Not on file  Social Needs  .  Financial resource strain: Not on file  . Food insecurity    Worry: Not on file    Inability: Not on file  . Transportation needs    Medical: Not on file    Non-medical: Not on file  Tobacco Use  . Smoking status: Former Smoker    Packs/day: 0.12    Years: 37.00    Pack years: 4.44    Types: Cigarettes    Quit date: 08/21/2017    Years since quitting: 1.7  . Smokeless tobacco: Never Used  Substance and Sexual Activity  . Alcohol use: No    Frequency: Never  . Drug use: No  . Sexual activity: Not on file  Lifestyle  . Physical activity    Days per week: Not on file    Minutes per session: Not on file  . Stress: Not on file  Relationships  . Social Herbalist on phone: Patient refused    Gets together: Patient refused    Attends religious service: Patient refused    Active member of club or organization: Patient refused    Attends meetings of clubs or organizations: Patient refused    Relationship status: Patient refused  Other Topics Concern  . Not on file  Social History Narrative   Married. 2 children.    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    Walks  with cane or wheelchair.    Former smoker.    Drinks caffeine.    Smoke alarm in the home.   Firearms in the home.    Wears her seatbelt.    Feels safe In her relationships.    Past Surgical History:  Procedure Laterality Date  . CORONARY ARTERY BYPASS GRAFT N/A 11/23/2017   Procedure: CORONARY ARTERY BYPASS GRAFTING (CABG) x 2, ON PUMP, USING LEFT INTERNAL MAMMARY ARTERY AND RIGHT GREATER SAPHENOUS VEIN HARVESTED ENDOSCOPICALLY;  Surgeon: Gaye Pollack, MD;  Location: [facility];  Service: Open Heart Surgery;  Laterality: N/A;  . CRYOABLATION     cervical  . IR ANGIO INTRA EXTRACRAN SEL COM CAROTID INNOMINATE UNI R MOD SED  08/21/2017  . IR ANGIO VERTEBRAL SEL SUBCLAVIAN INNOMINATE UNI R MOD SED  08/21/2017  . IR PERCUTANEOUS ART THROMBECTOMY/INFUSION INTRACRANIAL INC DIAG ANGIO  08/21/2017  . IR RADIOLOGIST  EVAL & MGMT  10/30/2017  . LOOP RECORDER INSERTION N/A 08/28/2017   Procedure: LOOP RECORDER INSERTION;  Surgeon: Constance Haw, MD;  Location: [facility];  Service: Cardiovascular;  Laterality: N/A;  . RADIOLOGY WITH ANESTHESIA N/A 08/21/2017   Procedure: RADIOLOGY WITH ANESTHESIA;  Surgeon: Luanne Bras, MD;  Location:  [facility];  Service: Radiology;  Laterality: N/A;  . RIGHT/LEFT HEART CATH AND CORONARY ANGIOGRAPHY N/A 11/20/2017   Procedure: RIGHT/LEFT HEART CATH AND CORONARY ANGIOGRAPHY;  Surgeon: Jettie Booze, MD;  Location: [facility];  Service: Cardiovascular;  Laterality: N/A;  . TEE WITHOUT CARDIOVERSION N/A 08/28/2017   Procedure: TRANSESOPHAGEAL ECHOCARDIOGRAM (TEE);  Surgeon: Fay Records, MD;  Location: [facility];  Service: Cardiovascular;  Laterality: N/A;  . TEE WITHOUT CARDIOVERSION N/A 11/23/2017   Procedure: TRANSESOPHAGEAL ECHOCARDIOGRAM (TEE);  Surgeon: Gaye Pollack, MD;  Location: [facility];  Service: Open Heart Surgery;  Laterality: N/A;  . TUBAL LIGATION     Past Medical History:  Diagnosis Date  . Arthritis   . CAD (coronary artery disease)    a. s/p NSTEMI in 10/2017 with cath showing LM disease --> s/p CABG on 11/23/2017 with LIMA-LAD and SVG-OM.   [name] Hay fever   . Ischemic cardiomyopathy    a. EF 35-40% by echo in 10/2017  . NSTEMI (non-ST elevated myocardial infarction) ([place])   . PAF (paroxysmal atrial fibrillation) ([place])    Diagnosed by ILR  . Pneumonia 11/16/2017  . Stroke ([place]) 08/23/2017   Left MCA infarct status post TPA and thrombectomy   BP 98/60   Pulse 97   Temp 98.5 F (36.9 C)   Ht 5\' 1"  (1.549 m)   Wt 127 lb (57.6 kg)   LMP  (LMP Unknown)   SpO2 95%   BMI 24.00 kg/m   Opioid Risk Score:   Fall Risk Score:  `1  Depression screen PHQ 2/9  Depression screen [facility] 2/9 11/22/2018 10/22/2018 10/22/2018 10/19/2018 11/10/2017 11/10/2017 10/13/2017  Decreased Interest 0 0 0 0 0 0 0  Down, Depressed, Hopeless 0 0 0 0 0 0  0  PHQ - 2 Score 0 0 0 0 0 0 0  Altered sleeping - 0 - - 0 - -  Tired, decreased energy - 1 - - 0 - -  Change in appetite - 0 - - 0 - -  Feeling bad or failure about yourself  - 0 - - 0 - -  Trouble concentrating - 0 - - 0 - -  Moving slowly or fidgety/restless - 0 - - 0 - -  Suicidal thoughts - 0 - - 0 - -  PHQ-9 Score - 1 - - 0 - -  Difficult doing work/chores - Not difficult at all - - Not difficult at all - -  Some recent data might be hidden   Review of Systems  HENT: Negative.   Eyes: Negative.   Respiratory: Negative.   Gastrointestinal: Negative.   Endocrine: Negative.   Genitourinary: Negative.   Musculoskeletal: Positive for arthralgias, gait problem and myalgias.  Skin: Positive for rash.  Allergic/Immunologic: Negative.   Hematological: Negative.   Psychiatric/Behavioral: Negative.       Objective:   Physical Exam Constitutional: She appears well-developed and well-nourished.  HENT: Atraumatic. Normocephalic. Eyes: EOMI. No discharge.  Cardiovascular: No JVD   Respiratory: Normal effort.   GI: Non-distended Musculoskeletal: No edema and tenderness. Gait: Hemiplegic gait Neurological: She is alert and oriented.  Motor: Right upper extremity: 3+/5 shoulder abduction, 4-/5 elbow flex/ext, 4-/5 hand grip  Right lower extremity: Hip flexion 4+/5, knee extension 4+/5, ADF/PF 4+/5  MAS: elbow flexors 1/4, distally 0/4 Skin: Diffuse rash, persistent.  Psychiatric: Affect is pleasant and appropriate    Assessment & Plan:  Right-handed female with unremarkable past history except tobacco abuse, on no prescription medication, presents for follow up for left MCA infarct status post TPA and thrombectomy and cardiac debility.  1. 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 Status post loop recorder  Patient states more discomfort/irration PRAFO/WHO/AFO  Cont Fluoxetine  She followed up with Vascular, no  further intervention at present  Cont therapies   Cont HEP  2. Spastic hemiplegia  Cont Baclofen to 10 TID (typically takes BID), good benefit  Will hold off on Botox at this time  3. Neurologic gait abnormality  With foot drag, improved with cognizant ambulation  No longer requiring quad cane  Encouraged use of AFO  Cont therapies  4. Bilateral knee OA  B/l Zilretta injections performed on 03/23/18 - with good benefit  Encouraged trial Lidoderm patch, reminded againx3  Xrays reviewed, medial > lateral OA b/l  Synvisc b/l on 10/05/2018 with benefit  Steroid injection on 5/28 with benefit (did not use Zilretta due to rash), but did not last as long as Zilretta injection, will reinject with Zilretta after improvement in rash  Cont Voltaren gel  Will likely require referral to Ortho  5. 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 Rash  Encouraged follow up with PCP, again

## 2019-05-21 NOTE — Progress Notes (Signed)
[facility] Summary Report / Loop Recorder 

## 2019-06-13 ENCOUNTER — Ambulatory Visit (INDEPENDENT_AMBULATORY_CARE_PROVIDER_SITE_OTHER): Payer: BC Managed Care – PPO | Admitting: *Deleted

## 2019-06-13 DIAGNOSIS — I63512 Cerebral infarction due to unspecified occlusion or stenosis of left middle cerebral artery: Secondary | ICD-10-CM | POA: Diagnosis not present

## 2019-06-14 LAB — CUP PACEART REMOTE DEVICE CHECK
Date Time Interrogation Session: 20200918024143
Implantable Pulse Generator Implant Date: 20181203

## 2019-06-17 NOTE — Progress Notes (Signed)
[facility] Summary Report / Loop Recorder 

## 2019-06-24 ENCOUNTER — Telehealth: Payer: Self-pay | Admitting: *Deleted

## 2019-06-24 ENCOUNTER — Telehealth: Payer: Self-pay | Admitting: Family Medicine

## 2019-06-24 ENCOUNTER — Ambulatory Visit: Payer: BC Managed Care – PPO | Admitting: Cardiology

## 2019-06-24 ENCOUNTER — Other Ambulatory Visit: Payer: Self-pay | Admitting: Family Medicine

## 2019-06-24 NOTE — Telephone Encounter (Signed)
Patient scheduled appt on 07/01/19

## 2019-06-24 NOTE — Telephone Encounter (Signed)
Patient would like to stop taking Levocetirivine 5 MG and Escitalopram 10MG . Patient states she is getting whelps "out of the blue" and then go away. Patient aware physician out of office 3 days.  Patient would like to get pneumonia vaccine.

## 2019-06-24 NOTE — Telephone Encounter (Signed)
Patient will need an appointment, please schedule.

## 2019-06-24 NOTE — Telephone Encounter (Signed)
[name] called to let Dr Posey Pronto that she is no longer taking gabapentin. She took it for a month and it did not help.

## 2019-06-25 ENCOUNTER — Ambulatory Visit (INDEPENDENT_AMBULATORY_CARE_PROVIDER_SITE_OTHER): Payer: BC Managed Care – PPO | Admitting: Family Medicine

## 2019-06-25 ENCOUNTER — Encounter: Payer: Self-pay | Admitting: Family Medicine

## 2019-06-25 ENCOUNTER — Other Ambulatory Visit: Payer: Self-pay

## 2019-06-25 ENCOUNTER — Ambulatory Visit: Payer: BC Managed Care – PPO

## 2019-06-25 VITALS — BP 110/70 | HR 103 | Temp 97.0°F | Resp 18 | Ht 61.0 in | Wt 125.2 lb

## 2019-06-25 DIAGNOSIS — T783XXA Angioneurotic edema, initial encounter: Secondary | ICD-10-CM

## 2019-06-25 DIAGNOSIS — L509 Urticaria, unspecified: Secondary | ICD-10-CM

## 2019-06-25 MED ORDER — METHYLPREDNISOLONE ACETATE 80 MG/ML IJ SUSP
80.0000 mg | Freq: Once | INTRAMUSCULAR | Status: AC
  Administered 2019-06-25: 80 mg via INTRAMUSCULAR

## 2019-06-25 MED ORDER — LORATADINE 10 MG PO TABS: 10.0000 mg | ORAL_TABLET | Freq: Every day | ORAL | 11 refills | Status: DC

## 2019-06-25 MED ORDER — PREDNISONE 10 MG PO TABS: ORAL_TABLET | ORAL | 0 refills | Status: DC

## 2019-06-25 NOTE — Assessment & Plan Note (Signed)
Pt instructed to go to er if she has trouble breathing or swallowing  Depo medrol and pred taper Refer to allergy and  Change to claritin ---- NOT claritin d

## 2019-06-25 NOTE — Patient Instructions (Signed)
Angioedema Angioedema is the sudden swelling of tissue in the body. Angioedema can affect any part of the body, but it most often affects the deeper parts of the skin, causing red, itchy patches (hives) to appear over the affected area. It often begins during the night and is found in the morning. Depending on the cause, angioedema may happen:  Only once.  Several times. It may come back in unpredictable patterns.  Repeatedly for several years. Over time, it may gradually stop coming back. Angioedema can be life-threatening if it affects the air passages that you breathe through. What are the causes? This condition may be caused by:  Foods, such as milk, eggs, shellfish, wheat, or nuts.  Certain medicines, such as ACE inhibitors, antibiotics, nonsteroidal anti-inflammatory drugs, birth control pills, or dyes used in X-rays.  Insect stings.  Infections. Angioedema can be inherited, and episodes can be triggered by:  Mild injury.  Dental work.  Surgery.  Stress.  Sudden changes in temperature.  Exercise. In some cases, the cause of this condition is not known. What are the signs or symptoms? Symptoms of this condition depend on where the swelling happens. Symptoms may include:  Swollen skin.  Red, itchy patches of skin (hives).  Redness in the affected area.  Pain in the affected area.  Swollen lips or tongue.  Wheezing.  Breathing problems.  If your internal organs are involved, symptoms may also include:  Nausea.  Abdominal pain.  Vomiting.  Difficulty swallowing.  Difficulty passing urine. How is this diagnosed? This condition may be diagnosed based on:  An exam of the affected area.  Your medical history.  Whether anyone in your family has had this condition before.  A review of any medicines you have been taking.  Tests, including: ? Allergy skin tests to see if the condition was caused by an allergic reaction. ? Blood tests to see if the  condition was caused by a gene. ? Tests to check for underlying diseases that could cause the condition. How is this treated? Treatment for this condition depends on the cause. It may involve any of the following:  If something triggered the condition, making changes to keep it from triggering the condition again.  If the condition affects your breathing, having tubes placed in your airway to keep it open.  Taking medicines to treat symptoms or prevent future episodes. These may include: ? Antihistamines. ? Epinephrine injections. ? Steroids. If your condition is severe, you may need to be treated at the hospital. Angioedema usually gets better in 24-48 hours. Follow these instructions at home:  Take over-the-counter and prescription medicines only as told by your health care provider.  If you were given medicines for emergency allergy treatment, always carry them with you.  Wear a medical bracelet as told by your health care provider.  If something triggers your condition, avoid the trigger, if possible.  If your condition is inherited and you are thinking about having children, talk to your health care provider. It is important to discuss the risks of passing on the condition to your children. Contact a health care provider if:  You have repeated episodes of angioedema.  Episodes of angioedema start to happen more often than they used to, even after you take steps to prevent them.  You have episodes of angioedema that are more severe than they have been before, even after you take steps to prevent them.  You are thinking about having children. Get help right away if:  You   have severe swelling of your mouth, tongue, or lips.  You have trouble breathing.  You have trouble swallowing.  You faint. This information is not intended to replace advice given to you by your health care provider. Make sure you discuss any questions you have with your health care provider. Document  Released: 11/21/2001 Document Revised: 08/25/2017 Document Reviewed: 03/22/2016 Elsevier Patient Education  2020 [employer]  

## 2019-06-25 NOTE — Assessment & Plan Note (Signed)
Depo medrol and pred taper  claritin  Refer to allergist

## 2019-06-25 NOTE — Progress Notes (Signed)
Patient ID: [name], female    DOB: Dec 16, 1960  Age: 58 y.o. MRN: [id number]    Subjective:  Subjective  HPI Pt c/o hives all over her body and swelling in her face and tongue last night.  It is slgihtly better today but her face is still swollen   No trouble swallowing or breathing    No fever   No new meds, foods , lotions , detergents etc   She stopped taking her xyzal.    She said the claritin d helped her but she had a stroke.   Review of Systems  Constitutional: Negative for appetite change, diaphoresis, fatigue and unexpected weight change.  Eyes: Negative for pain, redness and visual disturbance.  Respiratory: Negative for cough, chest tightness, shortness of breath and wheezing.   Cardiovascular: Negative for chest pain, palpitations and leg swelling.  Endocrine: Negative for cold intolerance, heat intolerance, polydipsia, polyphagia and polyuria.  Genitourinary: Negative for difficulty urinating, dysuria and frequency.  Neurological: Negative for dizziness, light-headedness, numbness and headaches.    History Past Medical History:  Diagnosis Date   Arthritis    CAD (coronary artery disease)    a. s/p NSTEMI in 10/2017 with cath showing LM disease --> s/p CABG on 11/23/2017 with LIMA-LAD and SVG-OM.    Hay fever    Ischemic cardiomyopathy    a. 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    She has a past surgical history that includes Radiology with anesthesia (N/A, 08/21/2017); IR PERCUTANEOUS ART THROMBECTOMY/INFUSION INTRACRANIAL INC DIAG ANGIO (08/21/2017); IR ANGIO INTRA EXTRACRAN SEL COM CAROTID INNOMINATE UNI R MOD SED (08/21/2017); IR ANGIO VERTEBRAL SEL SUBCLAVIAN INNOMINATE UNI R MOD SED (08/21/2017); LOOP RECORDER INSERTION (N/A, 08/28/2017);  TEE without cardioversion (N/A, 08/28/2017); IR Radiologist Eval & Mgmt (10/30/2017); Cryoablation; Tubal ligation; RIGHT/LEFT HEART CATH AND CORONARY ANGIOGRAPHY (N/A, 11/20/2017); Coronary artery bypass graft (N/A, 11/23/2017); and TEE without cardioversion (N/A, 11/23/2017).   Her family history includes Breast cancer (age of onset: 29) in her cousin; Diabetes type II in her mother; Heart attack in her father; Hyperlipidemia in her mother; Hypertension in her brother and mother.She reports that she quit smoking about 22 months ago. Her smoking use included cigarettes. She has a 4.44 pack-year smoking history. She has never used smokeless tobacco. She reports that she does not drink alcohol or use drugs.  Current Outpatient Medications on File Prior to Visit  Medication Sig Dispense Refill   baclofen (LIORESAL) 10 MG tablet TAKE 1 TABLET BY MOUTH THREE TIMES DAILY 90 tablet 2   bisacodyl (DULCOLAX) 10 MG suppository Place 1 suppository (10 mg total) rectally as needed for moderate constipation. 12 suppository 0   calcium carbonate (TUMS - DOSED IN MG ELEMENTAL CALCIUM) 500 MG chewable tablet Chew 1 tablet by mouth daily.     cholecalciferol (VITAMIN D) 1000 units tablet Take 1,000 Units by mouth daily.     diclofenac sodium (VOLTAREN) 1 % GEL Apply 2 g topically 4 (four) times daily. 1 Tube 1   ENTRESTO 24-26 MG Take 1 tablet by mouth twice daily 60 tablet 3   ferrous sulfate 325 (65 FE) MG tablet Take 325 mg by mouth daily with breakfast.     furosemide (LASIX) 20 MG tablet Take 0.5 tablets (10  mg total) by mouth daily. 45 tablet 1   Potassium Chloride ER 20 MEQ TBCR Take 1 tablet by mouth once daily 30 tablet 0   rosuvastatin (CRESTOR) 40 MG tablet Take 1 tablet (40 mg total) by mouth daily. 90 tablet 3   triamcinolone cream (KENALOG) 0.1 % Apply 1 application topically 2 (two) times daily. 30 g 2   vitamin B-12 (CYANOCOBALAMIN) 100 MCG tablet Take 100 mcg by mouth daily.     XARELTO 20  MG TABS tablet TAKE 1 TABLET BY MOUTH ONCE DAILY WITH SUPPER 90 tablet 1   carvedilol (COREG) 3.125 MG tablet Take 1 tablet (3.125 mg total) by mouth 2 (two) times daily. 180 tablet 3   escitalopram (LEXAPRO) 10 MG tablet Take 1 tablet (10 mg total) by mouth daily. (Patient not taking: Reported on 06/25/2019) 90 tablet 1   No current facility-administered medications on file prior to visit.      Objective:  Objective  Physical Exam Vitals signs and nursing note reviewed.  Constitutional:      Appearance: She is well-developed.  HENT:     Head: Normocephalic and atraumatic.  Eyes:     Conjunctiva/sclera: Conjunctivae normal.  Neck:     Musculoskeletal: Normal range of motion and neck supple.     Thyroid: No thyromegaly.     Vascular: No carotid bruit or JVD.  Cardiovascular:     Rate and Rhythm: Normal rate and regular rhythm.     Heart sounds: Normal heart sounds. No murmur.  Pulmonary:     Effort: Pulmonary effort is normal. No respiratory distress.     Breath sounds: Normal breath sounds. No wheezing or rales.  Chest:     Chest wall: No tenderness.  Skin:    Findings: Rash present. Rash is urticarial.       Neurological:     Mental Status: She is alert and oriented to person, place, and time.    BP 110/70 (BP Location: Left Arm, Patient Position: Sitting, Cuff Size: Normal)    Pulse (!) 103    Temp (!) 97 F (36.1 C) (Temporal)    Resp 18    Ht 5\' 1"  (1.549 m)    Wt 125 lb 3.2 oz (56.8 kg)    LMP  (LMP Unknown)    SpO2 96%    BMI 23.66 kg/m  Wt Readings from Last 3 Encounters:  06/25/19 125 lb 3.2 oz (56.8 kg)  05/14/19 127 lb (57.6 kg)  03/15/19 124 lb (56.2 kg)     Lab Results  Component Value Date   WBC 8.7 03/15/2018   HGB 14.5 03/15/2018   HCT 47.5 (H) 03/15/2018   PLT 364 03/15/2018   GLUCOSE 95 06/01/2018   CHOL 177 10/22/2018   TRIG 166.0 (H) 10/22/2018   HDL 49.40 10/22/2018   LDLCALC 94 10/22/2018   ALT 10 (L) 11/30/2017   AST 20 11/30/2017    NA 138 06/01/2018   K 4.0 06/01/2018   CL 103 06/01/2018   CREATININE 0.62 06/01/2018   BUN 19 06/01/2018   CO2 29 06/01/2018   TSH 1.98 02/02/2018   INR 1.27 11/23/2017   HGBA1C 5.6 11/22/2017    Vas US Carotid  Result Date: 11/27/2018 Carotid Arterial Duplex Study Indications:       Carotid artery disease. Risk Factors:      Hypertension, past history of smoking, coronary artery  disease, prior CVA. Comparison Study:  05/21/2018 carotid ultrasound Performing Technologist: Kathrine Comfort RVT, RDCS  Examination Guidelines: A complete evaluation includes B-mode imaging, spectral Doppler, color Doppler, and power Doppler as needed of all accessible portions of each vessel. Bilateral testing is considered an integral part of a complete examination. Limited examinations for reoccurring indications may be performed as noted.  Right Carotid Findings: +----------+--------+--------+--------+--------------------------+--------+             PSV cm/s EDV cm/s Stenosis Describe                   Comments  +----------+--------+--------+--------+--------------------------+--------+  CCA Prox   93       30                                                     +----------+--------+--------+--------+--------------------------+--------+  CCA Mid    76       25       <50%     smooth                               +----------+--------+--------+--------+--------------------------+--------+  CCA Distal 74       22                                                     +----------+--------+--------+--------+--------------------------+--------+  ICA Prox   129      41       1-39%    irregular and heterogenous           +----------+--------+--------+--------+--------------------------+--------+  ICA Mid    103      34                                                     +----------+--------+--------+--------+--------------------------+--------+  ICA Distal 73       26                                                      +----------+--------+--------+--------+--------------------------+--------+  ECA        98       14                calcific and irregular               +----------+--------+--------+--------+--------------------------+--------+ +----------+--------+-------+----------------+-------------------+             PSV cm/s EDV cms Describe         Arm Pressure (mmHg)  +----------+--------+-------+----------------+-------------------+  Subclavian 57               Multiphasic, WNL                      +----------+--------+-------+----------------+-------------------+ +---------+--------+--+--------+---------+  Vertebral PSV cm/s 75 EDV cm/s Antegrade  +---------+--------+--+--------+---------+  Left Carotid Findings: +----------+--------+--------+--------+----------------------+--------+  PSV cm/s EDV cm/s Stenosis Describe               Comments  +----------+--------+--------+--------+----------------------+--------+  CCA Prox   89       19                                                 +----------+--------+--------+--------+----------------------+--------+  CCA Mid    72       20                                                 +----------+--------+--------+--------+----------------------+--------+  CCA Distal 50       15                                                 +----------+--------+--------+--------+----------------------+--------+  ICA Prox   133      42       40-59%   calcific and irregular           +----------+--------+--------+--------+----------------------+--------+  ICA Mid    104      35                                                 +----------+--------+--------+--------+----------------------+--------+  ICA Distal 67       21                                                 +----------+--------+--------+--------+----------------------+--------+  ECA        97       13                                                 +----------+--------+--------+--------+----------------------+--------+  +----------+--------+--------+----------------+-------------------+  Subclavian PSV cm/s EDV cm/s Describe         Arm Pressure (mmHg)  +----------+--------+--------+----------------+-------------------+             140               Multiphasic, WNL                      +----------+--------+--------+----------------+-------------------+ +---------+--------+--------+--------------+  Vertebral PSV cm/s EDV cm/s Not identified  +---------+--------+--------+--------------+  Summary: Right Carotid: Velocities in the right ICA are consistent with a 1-39% stenosis.                Non-hemodynamically significant plaque <50% noted in the CCA. The                ECA appears <50% stenosed. Decreased category of stenosis when                compared to previous exam. Left Carotid: Velocities  in the left ICA are consistent with a 40-59% stenosis.               Non-hemodynamically significant plaque noted in the CCA. The ECA               appears <50% stenosed. Essentially unchanged compared to previous               exam. Vertebrals:  Right vertebral artery demonstrates antegrade flow. Left vertebral              artery was not visualized, this is unchanged when compared to              previous exam. Subclavians: Normal flow hemodynamics were seen in bilateral subclavian              arteries. *See table(s) above for measurements and observations.  Electronically signed by Curt Jews MD on 11/27/2018 at 12:01:40 PM.    Final      Assessment & Plan:  Plan  I have discontinued Marquetta G. Fedder's levocetirizine and gabapentin. I am also having her start on loratadine and predniSONE. Additionally, I am having her maintain her vitamin B-12, cholecalciferol, ferrous sulfate, calcium carbonate, triamcinolone cream, carvedilol, diclofenac sodium, escitalopram, bisacodyl, furosemide, rosuvastatin, baclofen, Xarelto, Entresto, and Potassium Chloride ER. We administered methylPREDNISolone acetate.  Meds ordered this encounter    Medications   loratadine (CLARITIN) 10 MG tablet    Sig: Take 1 tablet (10 mg total) by mouth daily.    Dispense:  30 tablet    Refill:  11   predniSONE (DELTASONE) 10 MG tablet    Sig: TAKE 3 TABLETS PO QD FOR 3 DAYS THEN TAKE 2 TABLETS PO QD FOR 3 DAYS THEN TAKE 1 TABLET PO QD FOR 3 DAYS THEN TAKE 1/2 TAB PO QD FOR 3 DAYS    Dispense:  20 tablet    Refill:  0   methylPREDNISolone acetate (DEPO-MEDROL) injection 80 mg    Problem List Items Addressed This Visit      Unprioritized   Angio-edema    Pt instructed to go to er if she has trouble breathing or swallowing  Depo medrol and pred taper Refer to allergy and  Change to claritin ---- NOT claritin d       Relevant Medications   loratadine (CLARITIN) 10 MG tablet   predniSONE (DELTASONE) 10 MG tablet   Other Relevant Orders   Ambulatory referral to Allergy   Urticaria - Primary    Depo medrol and pred taper  claritin  Refer to allergist        Relevant Medications   loratadine (CLARITIN) 10 MG tablet   predniSONE (DELTASONE) 10 MG tablet   Other Relevant Orders   Ambulatory referral to Allergy      Follow-up: Return if symptoms worsen or fail to improve, for f/u pcp if no better.  Ann Held, DO

## 2019-06-27 ENCOUNTER — Ambulatory Visit: Payer: BC Managed Care – PPO | Admitting: Cardiology

## 2019-07-01 ENCOUNTER — Encounter: Payer: Self-pay | Admitting: Family Medicine

## 2019-07-01 ENCOUNTER — Other Ambulatory Visit: Payer: Self-pay

## 2019-07-01 ENCOUNTER — Ambulatory Visit (INDEPENDENT_AMBULATORY_CARE_PROVIDER_SITE_OTHER): Payer: BC Managed Care – PPO | Admitting: Family Medicine

## 2019-07-01 VITALS — BP 93/59 | HR 85 | Temp 98.1°F | Resp 16 | Ht 61.0 in | Wt 127.2 lb

## 2019-07-01 DIAGNOSIS — Z23 Encounter for immunization: Secondary | ICD-10-CM | POA: Diagnosis not present

## 2019-07-01 DIAGNOSIS — L509 Urticaria, unspecified: Secondary | ICD-10-CM

## 2019-07-01 MED ORDER — LEVOCETIRIZINE DIHYDROCHLORIDE 5 MG PO TABS: 2.5000 mg | ORAL_TABLET | Freq: Every evening | ORAL | 3 refills | Status: DC

## 2019-07-01 MED ORDER — HYDROXYZINE HCL 10 MG PO TABS: 10.0000 mg | ORAL_TABLET | Freq: Every day | ORAL | 1 refills | Status: DC

## 2019-07-01 NOTE — Patient Instructions (Signed)
1/2 tab xyzal before bed.  Claritin in the day  Attend allergy appt. If they can not find a cause with testing, then we may need to consider statin group as  Potential cause. You obviously will need something for CV protection- but may need to attempt injectable.

## 2019-07-01 NOTE — Telephone Encounter (Signed)
Thank you :)

## 2019-07-01 NOTE — Progress Notes (Signed)
Patient ID: [name], female  DOB: [birth year], 58 y.o.   MRN: [id number] Patient Care Team    Relationship Specialty Notifications Start End  [name], DO PCP - General Family Medicine  10/13/17   Constance Haw, MD PCP - Electrophysiology Cardiology Admissions 08/28/17   Satira Sark, MD PCP - Cardiology Cardiology Admissions 12/15/17   Rosalin Hawking, MD Consulting Physician Neurology  10/13/17   Jamse Arn, MD Consulting Physician Physical Medicine and Rehabilitation  10/13/17   Luanne Bras, MD Consulting Physician Interventional Radiology  10/13/17     Chief Complaint  Patient presents with   discuss medications    Pt would like to discuss medications to see if she can come off medications. She breaks out in rashes and thinks that from medications.     Subjective:  [name] is a 58 y.o.  female present for urticaria.  Patient has had hives since coming out of the hospital 08/18/2017.  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  At that time it was felt to be a drug rash and patient was prescribed aspirin, atorvastatin, loratadine, metoprolol tartrate. First reported date of hives was 08/30/2017. Per inpt nurse note she awoke with them and there were no medication changes recently made to her regimen. She was transferred to rehab 08/28/2017. Per notes drug allergy was felt to be from metoprolol.   Depression screen [facility] 2/9 11/22/2018 10/22/2018 10/22/2018 10/19/2018 11/10/2017  Decreased Interest 0 0 0 0 0  Down, Depressed, Hopeless 0 0 0 0 0  PHQ - 2 Score 0 0 0 0 0  Altered sleeping - 0 - - 0  Tired, decreased energy - 1 - - 0  Change in appetite - 0 - - 0  Feeling bad or failure about yourself  - 0 - - 0  Trouble concentrating - 0 - - 0  Moving slowly or fidgety/restless - 0 - - 0  Suicidal thoughts - 0 - - 0  PHQ-9 Score - 1 - - 0  Difficult doing work/chores - Not difficult at all - - Not difficult at all  Some recent data  might be hidden   GAD 7 : Generalized Anxiety Score 10/22/2018 11/10/2017  Nervous, Anxious, on Edge 0 0  Control/stop worrying 0 0  Worry too much - different things 0 0  Trouble relaxing 0 0  Restless 0 0  Easily annoyed or irritable 0 0  Afraid - awful might happen 0 0  Total GAD 7 Score 0 0  Anxiety Difficulty Not difficult at all Not difficult at all          Fall Risk  05/14/2019 03/15/2019 02/21/2019 11/22/2018 10/19/2018  Falls in the past year? 0 0 0 0 0  Number falls in past yr: - - - - -  Injury with Fall? - - - - -   Immunization History  Administered Date(s) Administered   Influenza,inj,Quad PF,6+ Mos 10/13/2017, 10/22/2018   Influenza,inj,quad, With Preservative 08/24/2017    No exam data present  Past Medical History:  Diagnosis Date   Arthritis    CAD (coronary artery disease)    a. s/p NSTEMI in 10/2017 with cath showing LM disease --> s/p CABG on 11/23/2017 with LIMA-LAD and SVG-OM.    Hay fever    Ischemic cardiomyopathy    a. 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   Allergies  Allergen Reactions   Lopressor [Metoprolol Tartrate] Swelling    Angioedema   Nsaids     On Xarelto   Gabapentin (Once-Daily) Rash   Lipitor [Atorvastatin] Diarrhea   Past Surgical History:  Procedure Laterality Date   CORONARY ARTERY BYPASS GRAFT N/A 11/23/2017   Procedure: CORONARY ARTERY BYPASS GRAFTING (CABG) x 2, ON PUMP, USING LEFT INTERNAL MAMMARY ARTERY AND RIGHT GREATER SAPHENOUS VEIN HARVESTED ENDOSCOPICALLY;  Surgeon: Gaye Pollack, MD;  Location: [facility];  Service: Open Heart Surgery;  Laterality: N/A;   CRYOABLATION     cervical   IR ANGIO INTRA EXTRACRAN SEL COM CAROTID INNOMINATE UNI R MOD SED  08/21/2017   IR ANGIO VERTEBRAL SEL  SUBCLAVIAN INNOMINATE UNI R MOD SED  08/21/2017   IR PERCUTANEOUS ART THROMBECTOMY/INFUSION INTRACRANIAL INC DIAG ANGIO  08/21/2017   IR RADIOLOGIST EVAL & MGMT  10/30/2017   LOOP RECORDER INSERTION N/A 08/28/2017   Procedure: LOOP RECORDER INSERTION;  Surgeon: Constance Haw, MD;  Location: [facility];  Service: Cardiovascular;  Laterality: N/A;   RADIOLOGY WITH ANESTHESIA N/A 08/21/2017   Procedure: RADIOLOGY WITH ANESTHESIA;  Surgeon: Luanne Bras, MD;  Location: [place];  Service: Radiology;  Laterality: N/A;   RIGHT/LEFT HEART CATH AND CORONARY ANGIOGRAPHY N/A 11/20/2017   Procedure: RIGHT/LEFT HEART CATH AND CORONARY ANGIOGRAPHY;  Surgeon: Jettie Booze, MD;  Location: [facility];  Service: Cardiovascular;  Laterality: N/A;   TEE WITHOUT CARDIOVERSION N/A 08/28/2017   Procedure: TRANSESOPHAGEAL ECHOCARDIOGRAM (TEE);  Surgeon: Fay Records, MD;  Location: [place];  Service: Cardiovascular;  Laterality: N/A;   TEE WITHOUT CARDIOVERSION N/A 11/23/2017   Procedure: TRANSESOPHAGEAL ECHOCARDIOGRAM (TEE);  Surgeon: Gaye Pollack, MD;  Location: [place];  Service: Open Heart Surgery;  Laterality: N/A;   TUBAL LIGATION     Family History  Problem Relation Age of Onset   Heart attack Father    Hypertension Mother    Hyperlipidemia Mother    Diabetes type II Mother    Hypertension Brother    Breast cancer Cousin 76   Social History   Socioeconomic History   Marital status: Married    Spouse name: Not on file   Number of children: 2   Years of education: Not on file   Highest education level: Not on file  Occupational History   Not on file  Social Needs   Financial resource strain: Not on file   Food insecurity    Worry: Not on file    Inability: Not on file   Transportation needs    Medical: Not on file    Non-medical: Not on file  Tobacco Use   Smoking status: Former Smoker    Packs/day: 0.12    Years: 37.00    Pack  years: 4.44    Types: Cigarettes    Quit date: 08/21/2017    Years since quitting: 1.8   Smokeless tobacco: Never Used  Substance and Sexual Activity   Alcohol use: No    Frequency: Never   Drug use: No   Sexual activity: Not on file  Lifestyle   Physical activity    Days per week: Not on file    Minutes per session: Not on file   Stress: Not on file  Relationships   Social connections  Talks on phone: Patient refused    Gets together: Patient refused    Attends religious service: Patient refused    Active member of club or organization: Patient refused    Attends meetings of clubs or organizations: Patient refused    Relationship status: Patient refused   Intimate partner violence    Fear of current or ex partner: Patient refused    Emotionally abused: Patient refused    Physically abused: Patient refused    Forced sexual activity: Patient refused  Other Topics Concern   Not on file  Social History Narrative   Married. 2 children.    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    Walks with cane or wheelchair.    Former smoker.    Drinks caffeine.    Smoke alarm in the home.   Firearms in the home.    Wears her seatbelt.    Feels safe In her relationships.    Allergies as of 07/01/2019      Reactions   Lopressor [metoprolol Tartrate] Swelling   Angioedema   Nsaids    On Xarelto   Gabapentin (once-daily) Rash   Lipitor [atorvastatin] Diarrhea      Medication List       Accurate as of July 01, 2019  2:22 PM. If you have any questions, ask your nurse or doctor.        STOP taking these medications   bisacodyl 10 MG suppository Commonly known as: DULCOLAX Stopped by: Howard Pouch, DO   diclofenac sodium 1 % Gel Commonly known as: Voltaren Stopped by: Howard Pouch, DO   escitalopram 10 MG tablet Commonly known as: LEXAPRO Stopped by: Howard Pouch, DO   predniSONE 10 MG tablet Commonly known as: DELTASONE Stopped by: Howard Pouch, DO     TAKE  these medications   baclofen 10 MG tablet Commonly known as: LIORESAL TAKE 1 TABLET BY MOUTH THREE TIMES DAILY   calcium carbonate 500 MG chewable tablet Commonly known as: TUMS - dosed in mg elemental calcium Chew 1 tablet by mouth daily.   carvedilol 3.125 MG tablet Commonly known as: COREG Take 1 tablet (3.125 mg total) by mouth 2 (two) times daily.   cholecalciferol 1000 units tablet Commonly known as: VITAMIN D Take 1,000 Units by mouth daily.   Entresto 24-26 MG Generic drug: sacubitril-valsartan Take 1 tablet by mouth twice daily   ferrous sulfate 325 (65 FE) MG tablet Take 325 mg by mouth daily with breakfast. Taking twice weekly   furosemide 20 MG tablet Commonly known as: LASIX Take 0.5 tablets (10 mg total) by mouth daily.   loratadine 10 MG tablet Commonly known as: CLARITIN Take 1 tablet (10 mg total) by mouth daily.   Potassium Chloride ER 20 MEQ Tbcr Take 1 tablet by mouth once daily   rosuvastatin 40 MG tablet Commonly known as: CRESTOR Take 1 tablet (40 mg total) by mouth daily.   triamcinolone cream 0.1 % Commonly known as: KENALOG Apply 1 application topically 2 (two) times daily.   vitamin B-12 100 MCG tablet Commonly known as: CYANOCOBALAMIN Take 100 mcg by mouth daily.   Xarelto 20 MG Tabs tablet Generic drug: rivaroxaban TAKE 1 TABLET BY MOUTH ONCE DAILY WITH SUPPER       All past medical history, surgical history, allergies, family history, immunizations andmedications were updated in the EMR today and reviewed under the history and medication portions of their EMR.      No results found.   ROS: 14 pt review  of systems performed and negative (unless mentioned in an HPI)  Objective: BP (!) 93/59 (BP Location: Left Arm, Patient Position: Sitting, Cuff Size: Normal)    Pulse 85    Temp 98.1 F (36.7 C) (Temporal)    Resp 16    Ht 5\' 1"  (1.549 m)    Wt 127 lb 4 oz (57.7 kg)    LMP  (LMP Unknown)    SpO2 95%    BMI 24.04 kg/m   Gen:  Afebrile. No acute distress.  Nontoxic. Gen: Afebrile. No acute distress.  HENT: AT. [place]. Bilateral TM visualized and normal in appearance. MMM. Eyes:Pupils Equal Round Reactive to light, Extraocular movements intact,  Conjunctiva without redness, discharge or icterus. CV: RRR 1/6 SM, no edema, +2/4 P posterior tibialis pulses Chest: CTAB, no wheeze or crackles Skin: mild macular papular rash on forearms, No purpura or petechiae.  Neuro: PERLA. EOMi. Alert. Oriented x3   Assessment/plan: [name] is a 58 y.o. female present for establishment of care. Urticaria -Lengthy review of changes in her medication regimen since onset of her hives show only common denominator medication during course of her hives has been in the statin group (lipitor/crestor).  Her hives also started to improve when on daily allergy medicine and Lexapro for anxiety. -She has an appointment with allergy specialist for testing. -With her history she obviously needs the coverage from a standing or statin only group for her cardiovascular conditions. - encouraged her to talk to her cardiologist about PCSk9i in place of statin.  -Xyzal half tab nightly, Claritin 10 mg in the morning.  Vistaril prescribed if needed for acute outbreak. - f/u 6 months if needing refills  Flu shot administered today.  > 25 minutes spent with patient, >50% of time spent face to face    No follow-ups on file.  Orders Placed This Encounter  Procedures   Flu Vaccine QUAD 36+ mos IM    Note is dictated utilizing voice recognition software. Although note has been proof read prior to signing, occasional typographical errors still can be missed. If any questions arise, please do not hesitate to call for verification.  Electronically signed by: Howard Pouch, DO [place]

## 2019-07-02 ENCOUNTER — Encounter: Payer: Self-pay | Admitting: Family Medicine

## 2019-07-03 ENCOUNTER — Encounter: Payer: Self-pay | Admitting: Family Medicine

## 2019-07-03 ENCOUNTER — Telehealth: Payer: Self-pay | Admitting: Family Medicine

## 2019-07-03 NOTE — Telephone Encounter (Signed)
Please inform patient the following information: I have reviewed all of her notes extensively from her hospitalizations since 2018 moving forward to try to identify common denominator and 1 of the medications that could be potentially causing her hives. -The only medication that she had been on from onset of hives until now is within the statin group.  If her hives are caused by a medication, then it would likely be her statin.  She obviously needs her cardiovascular protection with her history of stroke, and her statin provides her that.  Therefore I would suggest she call her cardiologist and discuss considering other options so that she can have a trial off of the statin group and see if her hives resolve. -I also would encourage her to move forward with her allergy appointment for testing.

## 2019-07-04 ENCOUNTER — Other Ambulatory Visit: Payer: Self-pay | Admitting: Family Medicine

## 2019-07-04 DIAGNOSIS — Z1231 Encounter for screening mammogram for malignant neoplasm of breast: Secondary | ICD-10-CM

## 2019-07-04 NOTE — Telephone Encounter (Signed)
Pt was called and given results, she verbalized understanding  

## 2019-07-09 ENCOUNTER — Ambulatory Visit: Payer: BC Managed Care – PPO | Admitting: Allergy and Immunology

## 2019-07-09 ENCOUNTER — Other Ambulatory Visit: Payer: Self-pay | Admitting: Physical Medicine & Rehabilitation

## 2019-07-09 ENCOUNTER — Encounter: Payer: Self-pay | Admitting: Allergy and Immunology

## 2019-07-09 ENCOUNTER — Other Ambulatory Visit: Payer: Self-pay

## 2019-07-09 DIAGNOSIS — T7840XA Allergy, unspecified, initial encounter: Secondary | ICD-10-CM | POA: Insufficient documentation

## 2019-07-09 DIAGNOSIS — J310 Chronic rhinitis: Secondary | ICD-10-CM | POA: Diagnosis not present

## 2019-07-09 DIAGNOSIS — T783XXD Angioneurotic edema, subsequent encounter: Secondary | ICD-10-CM | POA: Diagnosis not present

## 2019-07-09 DIAGNOSIS — L500 Allergic urticaria: Secondary | ICD-10-CM | POA: Diagnosis not present

## 2019-07-09 DIAGNOSIS — T7840XD Allergy, unspecified, subsequent encounter: Secondary | ICD-10-CM

## 2019-07-09 MED ORDER — AZELASTINE-FLUTICASONE 137-50 MCG/ACT NA SUSP: 1.0000 | Freq: Two times a day (BID) | NASAL | 5 refills | Status: DC | PRN

## 2019-07-09 MED ORDER — FAMOTIDINE 20 MG PO TABS: 20.0000 mg | ORAL_TABLET | Freq: Two times a day (BID) | ORAL | 5 refills | Status: DC | PRN

## 2019-07-09 MED ORDER — FEXOFENADINE HCL 180 MG PO TABS: 180.0000 mg | ORAL_TABLET | Freq: Every day | ORAL | 5 refills | Status: DC | PRN

## 2019-07-09 NOTE — Assessment & Plan Note (Signed)
Associated angioedema occurs in up to 50% of patients with recurrent urticaria.  Treatment/diagnostic plan as outlined above.  Should symptoms recur, a journal is to be kept recording any foods eaten, beverages consumed, and medications taken within a 6 hour time period prior to the onset of symptoms, as well as record activities being performed, and environmental conditions. For any symptoms concerning for anaphylaxis, 911 is to be called immediately.

## 2019-07-09 NOTE — Patient Instructions (Addendum)
Recurrent urticaria The patient is finishing up a 2-week course of prednisone, therefore we are unable to proceed with skin testing or lab work today.   The patient is scheduled to return in the near future for allergy skin testing after having been off of prednisone for at least 10 days and off of antihistamines for at least 3 days.    If skin test results are unrevealing of an etiology, we will evaluate further with lab work.  If possible, hold Crestor for several weeks and assess for symptom improvement.  Instructions have been discussed and provided for H1/H2 receptor blockade with titration to find lowest effective dose.  Angioedema Associated angioedema occurs in up to 50% of patients with recurrent urticaria.  Treatment/diagnostic plan as outlined above.  Should symptoms recur, a journal is to be kept recording any foods eaten, beverages consumed, and medications taken within a 6 hour time period prior to the onset of symptoms, as well as record activities being performed, and environmental conditions. For any symptoms concerning for anaphylaxis, 911 is to be called immediately.  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  A prescription has been provided for azelastine/fluticasone nasal spray, 1 spray per nostril twice daily as needed.  Nasal saline spray (i.e., Simply Saline) or nasal saline lavage (i.e., NeilMed) is recommended as needed and prior to medicated nasal sprays.  Continue antihistamines as needed.  Further recommendations will b Phillips Odor e made after skin testing.   Return in about 2 weeks (around 07/23/2019) for skin testing after having been off of prednisone for at least 10 days, antihistamines for at least 3 days, and azelastine/fluticasone nasal spray for least 3 days.  Urticaria (Hives)  . Fexofenadine (Allegra) 180 mg once a day.  If symptoms continue then increase to .  [name] Fexofenadine (Allegra) 180 mg  twice a day.  If symptoms  continue then increase to .  [name] Fexofenadine (Allegra) 180 mg  twice a day and famotidine (Pepcid) 20 mg once a day.  If symptoms continue then increase to.  [name] Fexofenadine (Allegra) 180 mg  twice a day and famotidine (Pepcid) 20 mg twice a day  May use Benadryl as needed for breakthrough symptoms       If no symptoms for 7 days, then step down dosage

## 2019-07-09 NOTE — Assessment & Plan Note (Signed)
The patient's history suggests gustatory rhinitis.  A prescription has been provided for azelastine/fluticasone nasal spray, 1 spray per nostril twice daily as needed.  Nasal saline spray (i.e., Simply Saline) or nasal saline lavage (i.e., NeilMed) is recommended as needed and prior to medicated nasal sprays.  Continue antihistamines as needed.  Further recommendations will b Phillips Odor e made after skin testing.

## 2019-07-09 NOTE — Assessment & Plan Note (Signed)
The patient is finishing up a 2-week course of prednisone, therefore we are unable to proceed with skin testing or lab work today.   The patient is scheduled to return in the near future for allergy skin testing after having been off of prednisone for at least 10 days and off of antihistamines for at least 3 days.    If skin test results are unrevealing of an etiology, we will evaluate further with lab work.  If possible, hold Crestor for several weeks and assess for symptom improvement.  Instructions have been discussed and provided for H1/H2 receptor blockade with titration to find lowest effective dose.

## 2019-07-09 NOTE — Progress Notes (Signed)
New Patient Note  RE: [name] MRN: [id number] DOB: March 03, 1961 Date of Office Visit: 07/09/2019  Referring provider: Carollee Stephenson, [name], * Primary care provider: [name], DO  Chief Complaint: Urticaria, Angioedema, and Pruritis  History of present illness: [name] is a 58 y.o. female seen today in consultation requested by Kaitlyn Schanz, DO.  She reports that 2 years ago she had myocardial infarction and cardiovascular accident and during the hospitalization developed hives.  She states that over the past 2 years the hives "come and go, comes more than goes."  The hives have appeared at different times over her chest, arms and torso.  The lesions are described as erythematous, raised, and pruritic.  Individual hives last less than 24 hours without leaving residual pigmentation or bruising. She has experienced associated angioedema of the lips and left side of her face but denies concomitant cardiopulmonary or GI symptoms.  The angioedema occurred approximately 2 weeks ago and was given corticosteroid injection and prednisone taper which will be finished in 2 days.  She has not experienced unexpected weight loss, recurrent fevers or drenching night sweats.  She was started on a statin while in the hospital 2 years ago after the MI/CVA and has been on that medication ever since.  She is curious if Crestor is the underlying cause of the hives.  Otherwise, no specific medication, food, skin care product, detergent, soap, or other environmental triggers have been identified. The symptoms do not seem to correlate with NSAIDs use or emotional stress. She did not have symptoms consistent with a respiratory tract infection at the time of symptom onset. [name] has tried to control symptoms with OTC antihistamines which have offered moderate temporary relief. Skin biopsy has not been performed. Recently, she has been experiencing hives a few times a week. She experiences episodes  of nasal congestion and occasional sneezing.  These episodes occur year around and tend to occur when she is eating meals.  Otherwise, no specific triggers have been identified.  She has taken levocetirizine or loratadine which is provided some relief with regards to the sneezing but not the nasal congestion.  Assessment and plan: Recurrent urticaria The patient is finishing up a 2-week course of prednisone, therefore we are unable to proceed with skin testing or lab work today.   The patient is scheduled to return in the near future for allergy skin testing after having been off of prednisone for at least 10 days and off of antihistamines for at least 3 days.    If skin test results are unrevealing of an etiology, we will evaluate further with lab work.  If possible, hold Crestor for several weeks and assess for symptom improvement.  Instructions have been discussed and provided for H1/H2 receptor blockade with titration to find lowest effective dose.  Angioedema Associated angioedema occurs in up to 50% of patients with recurrent urticaria.  Treatment/diagnostic plan as outlined above.  Should symptoms recur, a journal is to be kept recording any foods eaten, beverages consumed, and medications taken within a 6 hour time period prior to the onset of symptoms, as well as record activities being performed, and environmental conditions. For any symptoms concerning for anaphylaxis, 911 is to be called immediately.  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  A prescription has been provided for azelastine/fluticasone nasal spray, 1 spray per nostril twice daily as needed.  Nasal saline spray (i.e., Simply Saline) or nasal saline lavage (i.e., NeilMed) is recommended  as needed and prior to medicated nasal sprays.  Continue antihistamines as needed.  Further recommendations will b [name] e made after skin testing.   Meds ordered this encounter  Medications  .  Azelastine-Fluticasone 137-50 MCG/ACT SUSP    Sig: Place 1 spray into both nostrils 2 (two) times daily as needed.    Dispense:  23 g    Refill:  5    Generic substitution allowed.  . fexofenadine (ALLEGRA) 180 MG tablet    Sig: Take 1 tablet (180 mg total) by mouth daily as needed for allergies or rhinitis.    Dispense:  60 tablet    Refill:  5  . famotidine (PEPCID) 20 MG tablet    Sig: Take 1 tablet (20 mg total) by mouth 2 (two) times daily as needed for heartburn or indigestion.    Dispense:  60 tablet    Refill:  5    Diagnostics: Testing was deferred today due to prolonged course of prednisone.  Physical examination: Blood pressure 110/70, pulse 91, temperature 97.6 F (36.4 C), temperature source Temporal, resp. rate 14, height 5' 0.5" (1.537 m), weight 127 lb (57.6 kg), SpO2 96 %.  General: Alert, interactive, in no acute distress. HEENT: TMs pearly gray, turbinates moderately edematous without discharge, post-pharynx moderately erythematous. Neck: Supple without lymphadenopathy. Lungs: Clear to auscultation without wheezing, rhonchi or rales. CV: Normal S1, S2 without murmurs. Abdomen: Nondistended, nontender. Skin: Warm and dry, without lesions or rashes. Extremities:  No clubbing, cyanosis or edema. Neuro:   Grossly intact.  Review of systems:  Review of systems negative except as noted in HPI / PMHx or noted below: Review of Systems  Constitutional: Negative.   HENT: Negative.   Eyes: Negative.   Respiratory: Negative.   Cardiovascular: Negative.   Gastrointestinal: Negative.   Genitourinary: Negative.   Musculoskeletal: Negative.   Skin: Negative.   Neurological: Negative.   Endo/Heme/Allergies: Negative.   Psychiatric/Behavioral: Negative.     Past medical history:  Past Medical History:  Diagnosis Date  . Angio-edema   . Arthritis   . CAD (coronary artery disease)    a. s/p NSTEMI in 10/2017 with cath showing LM disease --> s/p CABG on 11/23/2017  with LIMA-LAD and SVG-OM.   [name] Hay fever   . Ischemic cardiomyopathy    a. EF 35-40% by echo in 10/2017  . NSTEMI (non-ST elevated myocardial infarction) ([place])   . PAF (paroxysmal atrial fibrillation) ([place])    Diagnosed by ILR  . Pneumonia 11/16/2017  . 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 Urticaria     Past surgical history:  Past Surgical History:  Procedure Laterality Date  . CORONARY ARTERY BYPASS GRAFT N/A 11/23/2017   Procedure: CORONARY ARTERY BYPASS GRAFTING (CABG) x 2, ON PUMP, USING LEFT INTERNAL MAMMARY ARTERY AND RIGHT GREATER SAPHENOUS VEIN HARVESTED ENDOSCOPICALLY;  Surgeon: Gaye Pollack, MD;  Location: [place];  Service: Open Heart Surgery;  Laterality: N/A;  . CRYOABLATION     cervical  . IR ANGIO INTRA EXTRACRAN SEL COM CAROTID INNOMINATE UNI R MOD SED  08/21/2017  . IR ANGIO VERTEBRAL SEL SUBCLAVIAN INNOMINATE UNI R MOD SED  08/21/2017  . IR PERCUTANEOUS ART THROMBECTOMY/INFUSION INTRACRANIAL INC DIAG ANGIO  08/21/2017  . IR RADIOLOGIST EVAL & MGMT  10/30/2017  . LOOP RECORDER INSERTION N/A 08/28/2017   Procedure: LOOP RECORDER INSERTION;  Surgeon: Constance Haw, MD;  Location: [place] CV [facility];  Service: Cardiovascular;  Laterality: N/A;  . RADIOLOGY WITH ANESTHESIA N/A 08/21/2017   Procedure: RADIOLOGY WITH ANESTHESIA;  Surgeon: Luanne Bras, MD;  Location: [place];  Service: Radiology;  Laterality: N/A;  . RIGHT/LEFT HEART CATH AND CORONARY ANGIOGRAPHY N/A 11/20/2017   Procedure: RIGHT/LEFT HEART CATH AND CORONARY ANGIOGRAPHY;  Surgeon: Jettie Booze, MD;  Location: [place] [facility];  Service: Cardiovascular;  Laterality: N/A;  . TEE WITHOUT CARDIOVERSION N/A 08/28/2017   Procedure: TRANSESOPHAGEAL ECHOCARDIOGRAM (TEE);  Surgeon: Fay Records, MD;  Location: [place];  Service: Cardiovascular;  Laterality: N/A;  . TEE WITHOUT CARDIOVERSION N/A 11/23/2017   Procedure: TRANSESOPHAGEAL ECHOCARDIOGRAM  (TEE);  Surgeon: Gaye Pollack, MD;  Location: [place];  Service: Open Heart Surgery;  Laterality: N/A;  . TUBAL LIGATION      Family history: Family History  Problem Relation Age of Onset  . Heart attack Father   . Hypertension Mother   . Hyperlipidemia Mother   . Diabetes type II Mother   . Urticaria Mother   . Hypertension Brother   . Breast cancer Cousin 68  . Immunodeficiency Neg Hx   . Eczema Neg Hx   . Atopy Neg Hx   . Asthma Neg Hx   . Angioedema Neg Hx   . Allergic rhinitis Neg Hx     Social history: Social History   Socioeconomic History  . Marital status: Married    Spouse name: Not on file  . Number of children: 2  . Years of education: Not on file  . Highest education level: Not on file  Occupational History  . Not on file  Social Needs  . Financial resource strain: Not on file  . Food insecurity    Worry: Not on file    Inability: Not on file  . Transportation needs    Medical: Not on file    Non-medical: Not on file  Tobacco Use  . Smoking status: Current Some Day Smoker    Packs/day: 0.10    Years: 37.00    Pack years: 3.70    Types: Cigarettes  . Smokeless tobacco: Never Used  Substance and Sexual Activity  . Alcohol use: No    Frequency: Never  . Drug use: No  . Sexual activity: Not on file  Lifestyle  . Physical activity    Days per week: Not on file    Minutes per session: Not on file  . Stress: Not on file  Relationships  . Social Herbalist on phone: Patient refused    Gets together: Patient refused    Attends religious service: Patient refused    Active member of club or organization: Patient refused    Attends meetings of clubs or organizations: Patient refused    Relationship status: Patient refused  . Intimate partner violence    Fear of current or ex partner: Patient refused    Emotionally abused: Patient refused    Physically abused: Patient refused    Forced sexual activity: Patient refused  Other Topics  Concern  . Not on file  Social History Narrative   Married. 2 children.    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    Walks with cane or wheelchair.    Former smoker.    Drinks caffeine.    Smoke alarm in the home.   Firearms in the home.    Wears her seatbelt.    Feels safe In her relationships.     Environmental History: The patient lives in a 74-year-old house  with carpeting the bedroom, gas heat, and central air.  There is a dog in the home which does not not have access to her bedroom.  She has been smoking cigarettes for the past 30 years and currently smokes 3 cigarettes/day.  Current Outpatient Medications  Medication Sig Dispense Refill  . baclofen (LIORESAL) 10 MG tablet TAKE 1 TABLET BY MOUTH THREE TIMES DAILY 90 tablet 1  . calcium carbonate (TUMS - DOSED IN MG ELEMENTAL CALCIUM) 500 MG chewable tablet Chew 1 tablet by mouth daily.    . cholecalciferol (VITAMIN D) 1000 units tablet Take 1,000 Units by mouth daily.    [name] ENTRESTO 24-26 MG Take 1 tablet by mouth twice daily 60 tablet 3  . ferrous sulfate 325 (65 FE) MG tablet Take 325 mg by mouth daily with breakfast. Taking twice weekly    . furosemide (LASIX) 20 MG tablet Take 0.5 tablets (10 mg total) by mouth daily. 45 tablet 1  . hydrOXYzine (ATARAX/VISTARIL) 10 MG tablet Take 1 tablet (10 mg total) by mouth at bedtime. 90 tablet 1  . levocetirizine (XYZAL) 5 MG tablet Take 0.5 tablets (2.5 mg total) by mouth every evening. 45 tablet 3  . loratadine (CLARITIN) 10 MG tablet Take 1 tablet (10 mg total) by mouth daily. 30 tablet 11  . Potassium Chloride ER 20 MEQ TBCR Take 1 tablet by mouth once daily 30 tablet 0  . rosuvastatin (CRESTOR) 40 MG tablet Take 1 tablet (40 mg total) by mouth daily. 90 tablet 3  . triamcinolone cream (KENALOG) 0.1 % Apply 1 application topically 2 (two) times daily. 30 g 2  . vitamin B-12 (CYANOCOBALAMIN) 100 MCG tablet Take 100 mcg by mouth daily.    Alveda Reasons 20 MG TABS tablet TAKE 1 TABLET BY MOUTH  ONCE DAILY WITH SUPPER 90 tablet 1  . Azelastine-Fluticasone 137-50 MCG/ACT SUSP Place 1 spray into both nostrils 2 (two) times daily as needed. 23 g 5  . carvedilol (COREG) 3.125 MG tablet Take 1 tablet (3.125 mg total) by mouth 2 (two) times daily. 180 tablet 3  . famotidine (PEPCID) 20 MG tablet Take 1 tablet (20 mg total) by mouth 2 (two) times daily as needed for heartburn or indigestion. 60 tablet 5  . fexofenadine (ALLEGRA) 180 MG tablet Take 1 tablet (180 mg total) by mouth daily as needed for allergies or rhinitis. 60 tablet 5   No current facility-administered medications for this visit.     Known medication allergies: Allergies  Allergen Reactions  . Lopressor [Metoprolol Tartrate] Swelling    Angioedema  . Nsaids     On Xarelto  . Gabapentin (Once-Daily) Rash  . Lipitor [Atorvastatin] Diarrhea    I appreciate the opportunity to take part in Stephanny's care. Please do not hesitate to contact me with questions.  Sincerely,   R. Edgar Frisk, MD

## 2019-07-12 ENCOUNTER — Encounter
Payer: BC Managed Care – PPO | Attending: Physical Medicine & Rehabilitation | Admitting: Physical Medicine & Rehabilitation

## 2019-07-12 ENCOUNTER — Telehealth: Payer: Self-pay | Admitting: Family Medicine

## 2019-07-12 ENCOUNTER — Encounter: Payer: Self-pay | Admitting: Physical Medicine & Rehabilitation

## 2019-07-12 ENCOUNTER — Other Ambulatory Visit: Payer: Self-pay

## 2019-07-12 VITALS — BP 107/72 | HR 81 | Temp 97.5°F | Ht 61.0 in | Wt 128.0 lb

## 2019-07-12 DIAGNOSIS — G5602 Carpal tunnel syndrome, left upper limb: Secondary | ICD-10-CM | POA: Diagnosis not present

## 2019-07-12 DIAGNOSIS — R200 Anesthesia of skin: Secondary | ICD-10-CM

## 2019-07-12 DIAGNOSIS — I63512 Cerebral infarction due to unspecified occlusion or stenosis of left middle cerebral artery: Secondary | ICD-10-CM

## 2019-07-12 DIAGNOSIS — M170 Bilateral primary osteoarthritis of knee: Secondary | ICD-10-CM | POA: Diagnosis not present

## 2019-07-12 DIAGNOSIS — G8110 Spastic hemiplegia affecting unspecified side: Secondary | ICD-10-CM | POA: Diagnosis not present

## 2019-07-12 DIAGNOSIS — R269 Unspecified abnormalities of gait and mobility: Secondary | ICD-10-CM

## 2019-07-12 NOTE — Telephone Encounter (Signed)
Pt was called and given information, she has appt with cardiologist 07/18/2019. She will call with updates.

## 2019-07-12 NOTE — Progress Notes (Signed)
Subjective:    Patient ID: [name], female    DOB: 10/15/1960, 58 y.o.   MRN: [id number]  HPI Right-handed female with unremarkable past history except tobacco abuse, on no prescription medication, left MCA infarct status post TPA and thrombectomy presents for follow up for cardiac debility s/p CABG and stroke.    Last clinic visit 05/14/2019.  Since that time, pt states she is not wearing her braces. She states she has been focused on the rash. She is seeing an allergist. She had increased rash with Gabapentin. She is in therapies again. Denies falls. Using Baclofen BID. She still has not tried Lidocaine patch.  Pain Inventory Average Pain 5 Pain Right Now 5 My pain is burning and tingling  In the last 24 hours, has pain interfered with the following? General activity 5 Relation with others 5 Enjoyment of life . What TIME of day is your pain at its worst? morning Sleep (in general) Fair  Pain is worse with: walking and standing Pain improves with: rest, medication and injections Relief from Meds: 5  Mobility walk without assistance how many minutes can you walk? 15 ability to climb steps?  yes do you drive?  yes Do you have any goals in this area?  yes  Function retired I need assistance with the following:  meal prep and household duties  Neuro/Psych No problems in this area  Prior Studies Any changes since last visit?  no  Physicians involved in your care Any changes since last visit?  no   Family History  Problem Relation Age of Onset  . Heart attack Father   . Hypertension Mother   . Hyperlipidemia Mother   . Diabetes type II Mother   . Urticaria Mother   . Hypertension Brother   . Breast cancer Cousin 8  . Immunodeficiency Neg Hx   . Eczema Neg Hx   . Atopy Neg Hx   . Asthma Neg Hx   . Angioedema Neg Hx   . Allergic rhinitis Neg Hx    Social History   Socioeconomic History  . Marital status: Married    Spouse name: Not on file  .  Number of children: 2  . Years of education: Not on file  . Highest education level: Not on file  Occupational History  . Not on file  Social Needs  . Financial resource strain: Not on file  . Food insecurity    Worry: Not on file    Inability: Not on file  . Transportation needs    Medical: Not on file    Non-medical: Not on file  Tobacco Use  . Smoking status: Current Some Day Smoker    Packs/day: 0.10    Years: 37.00    Pack years: 3.70    Types: Cigarettes  . Smokeless tobacco: Never Used  Substance and Sexual Activity  . Alcohol use: No    Frequency: Never  . Drug use: No  . Sexual activity: Not on file  Lifestyle  . Physical activity    Days per week: Not on file    Minutes per session: Not on file  . Stress: Not on file  Relationships  . Social Herbalist on phone: Patient refused    Gets together: Patient refused    Attends religious service: Patient refused    Active member of club or organization: Patient refused    Attends meetings of clubs or organizations: Patient refused    Relationship status: Patient  refused  Other Topics Concern  . Not on file  Social History Narrative   Married. 2 children.    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    Walks with cane or wheelchair.    Former smoker.    Drinks caffeine.    Smoke alarm in the home.   Firearms in the home.    Wears her seatbelt.    Feels safe In her relationships.    Past Surgical History:  Procedure Laterality Date  . CORONARY ARTERY BYPASS GRAFT N/A 11/23/2017   Procedure: CORONARY ARTERY BYPASS GRAFTING (CABG) x 2, ON PUMP, USING LEFT INTERNAL MAMMARY ARTERY AND RIGHT GREATER SAPHENOUS VEIN HARVESTED ENDOSCOPICALLY;  Surgeon: Gaye Pollack, MD;  Location: [facility];  Service: Open Heart Surgery;  Laterality: N/A;  . CRYOABLATION     cervical  . IR ANGIO INTRA EXTRACRAN SEL COM CAROTID INNOMINATE UNI R MOD SED  08/21/2017  . IR ANGIO VERTEBRAL SEL SUBCLAVIAN INNOMINATE UNI R MOD SED   08/21/2017  . IR PERCUTANEOUS ART THROMBECTOMY/INFUSION INTRACRANIAL INC DIAG ANGIO  08/21/2017  . IR RADIOLOGIST EVAL & MGMT  10/30/2017  . LOOP RECORDER INSERTION N/A 08/28/2017   Procedure: LOOP RECORDER INSERTION;  Surgeon: Constance Haw, MD;  Location: [place] [facility];  Service: Cardiovascular;  Laterality: N/A;  . RADIOLOGY WITH ANESTHESIA N/A 08/21/2017   Procedure: RADIOLOGY WITH ANESTHESIA;  Surgeon: Luanne Bras, MD;  Location: [place];  Service: Radiology;  Laterality: N/A;  . RIGHT/LEFT HEART CATH AND CORONARY ANGIOGRAPHY N/A 11/20/2017   Procedure: RIGHT/LEFT HEART CATH AND CORONARY ANGIOGRAPHY;  Surgeon: Jettie Booze, MD;  Location: [place] [facility];  Service: Cardiovascular;  Laterality: N/A;  . TEE WITHOUT CARDIOVERSION N/A 08/28/2017   Procedure: TRANSESOPHAGEAL ECHOCARDIOGRAM (TEE);  Surgeon: Fay Records, MD;  Location: [place];  Service: Cardiovascular;  Laterality: N/A;  . TEE WITHOUT CARDIOVERSION N/A 11/23/2017   Procedure: TRANSESOPHAGEAL ECHOCARDIOGRAM (TEE);  Surgeon: Gaye Pollack, MD;  Location: [place];  Service: Open Heart Surgery;  Laterality: N/A;  . TUBAL LIGATION     Past Medical History:  Diagnosis Date  . Angio-edema   . Arthritis   . CAD (coronary artery disease)    a. s/p NSTEMI in 10/2017 with cath showing LM disease --> s/p CABG on 11/23/2017 with LIMA-LAD and SVG-OM.   [name] Hay fever   . Ischemic cardiomyopathy    a. EF 35-40% by echo in 10/2017  . NSTEMI (non-ST elevated myocardial infarction) ([place])   . PAF (paroxysmal atrial fibrillation) ([place])    Diagnosed by ILR  . Pneumonia 11/16/2017  . Stroke [facility]) 08/23/2017   Left MCA infarct status post TPA and thrombectomy  . Urticaria    BP 107/72   Pulse 81   Temp (!) 97.5 F (36.4 C)   Ht 5\' 1"  (1.549 m)   Wt 128 lb (58.1 kg)   LMP  (LMP Unknown)   SpO2 94%   BMI 24.19 kg/m   Opioid Risk Score:   Fall Risk Score:  `1  Depression screen PHQ 2/9  Depression screen  [facility] 2/9 11/22/2018 10/22/2018 10/22/2018 10/19/2018 11/10/2017 11/10/2017 10/13/2017  Decreased Interest 0 0 0 0 0 0 0  Down, Depressed, Hopeless 0 0 0 0 0 0 0  PHQ - 2 Score 0 0 0 0 0 0 0  Altered sleeping - 0 - - 0 - -  Tired, decreased energy - 1 - - 0 - -  Change in appetite - 0 - -  0 - -  Feeling bad or failure about yourself  - 0 - - 0 - -  Trouble concentrating - 0 - - 0 - -  Moving slowly or fidgety/restless - 0 - - 0 - -  Suicidal thoughts - 0 - - 0 - -  PHQ-9 Score - 1 - - 0 - -  Difficult doing work/chores - Not difficult at all - - Not difficult at all - -  Some recent data might be hidden   Review of Systems  Constitutional: Negative.   HENT: Negative.   Eyes: Negative.   Respiratory: Negative.   Gastrointestinal: Negative.   Endocrine: Negative.   Genitourinary: Negative.   Musculoskeletal: Positive for arthralgias and gait problem.  Skin: Positive for rash.  Allergic/Immunologic: Negative.   Neurological: Positive for weakness and numbness.  Hematological: Negative.   Psychiatric/Behavioral: Negative.       Objective:   Physical Exam Constitutional: She appears well-developed and well-nourished.  HENT: Normocephalic. Atraumatic.  Eyes: EOMI. NO discharge.  Cardiovascular: No JVD   Respiratory: Normal effort.   GI: Non-distended Musculoskeletal: No edema and tenderness. Gait: Hemiplegic gait Neurological: She is alert   Motor: Right upper extremity: 3-/5 shoulder abduction, 4-/5 elbow flex/ext, 4-/5 hand grip  Right lower extremity: Hip flexion 4+/5, knee extension 4+/5, ADF/PF 4+/5  MAS: elbow flexors 1/4, distally 0/4 Skin Rash improving.  Psychiatric: Affect is pleasant and appropriate    Assessment & Plan:  Right-handed female with unremarkable past history except tobacco abuse, on no prescription medication, presents for follow up for left MCA infarct status post TPA and thrombectomy and cardiac debility.  1. Right hemiplegia, dysphagia, hypophonia  secondary to acute left MCA infarct status post TPA and thrombectomy. Status post loop recorder  Patient states more discomfort/irration PRAFO/WHO/AFO, awaiting resolution of rash  Cont Fluoxetine  She followed up with Vascular, no further intervention at present  Cont therapies   Cont HEP  2. Spastic hemiplegia  Cont Baclofen to 10 TID (typically takes BID), good benefit  Will hold off on Botox at this time  3. Neurologic gait abnormality  With foot drag, improved with cognizant ambulation  No longer requiring quad cane  Encouraged use of AFO, after resolution of rash  Cont therapies  4. Bilateral knee OA  B/l Zilretta injections performed on 03/23/18 - with good benefit  Encouraged trial Lidoderm patch, reminded againx4  Xrays reviewed, medial > lateral OA b/l  Synvisc b/l on 10/05/2018 with benefit  Steroid injection on 5/28 with benefit (did not use Zilretta due to rash), but did not last as long as Zilretta injection  Cont Voltaren gel   Will consider Synvisc after allergy appointment  Will likely require referral to Ortho  5. Left CTS  Allergic to Gabapentin  Will consider Pregabalin after Allergy appointment  NCS/EMG showing L>R CTS  Cont bracing   Pt would like trial meds before referral for steroid injection  6. Rash  Following up with Allergy

## 2019-07-12 NOTE — Telephone Encounter (Signed)
I did see the recommendations.  Per my last phone note,  I suggested that she would likely have to try to come off statin to see if her symptoms resolved.  However I did recommend she talk to her cardiologist concerning alternative medications -in order to be continue her cardiovascular protection that she will need with her history. When I reviewed all of her records the only medication that has been a consistent is within the statin family since the onset of her urticaria, going all the way back to her first hospitalization.

## 2019-07-12 NOTE — Telephone Encounter (Signed)
Patient reports she seen Dr. Verlin Fester with Allergy/Asthma at our office this week. He requested patient go off her Crestor for a few weeks. Patient was asking Dr. Raoul Pitch if she is aware of this recommendation and if she approves.  Please contact patient at [phone].

## 2019-07-12 NOTE — Telephone Encounter (Signed)
Pt was seen by [facility] allergy/asthma on 07/09/2019, OV note is in the chart. Please advise.

## 2019-07-16 ENCOUNTER — Ambulatory Visit (INDEPENDENT_AMBULATORY_CARE_PROVIDER_SITE_OTHER): Payer: BC Managed Care – PPO | Admitting: *Deleted

## 2019-07-16 ENCOUNTER — Other Ambulatory Visit: Payer: Self-pay

## 2019-07-16 ENCOUNTER — Ambulatory Visit (INDEPENDENT_AMBULATORY_CARE_PROVIDER_SITE_OTHER): Payer: BC Managed Care – PPO | Admitting: Family Medicine

## 2019-07-16 ENCOUNTER — Encounter: Payer: Self-pay | Admitting: Family Medicine

## 2019-07-16 VITALS — BP 88/57 | HR 96 | Temp 98.3°F | Resp 17 | Ht 63.0 in | Wt 123.0 lb

## 2019-07-16 DIAGNOSIS — F17200 Nicotine dependence, unspecified, uncomplicated: Secondary | ICD-10-CM

## 2019-07-16 DIAGNOSIS — I5042 Chronic combined systolic (congestive) and diastolic (congestive) heart failure: Secondary | ICD-10-CM

## 2019-07-16 DIAGNOSIS — R319 Hematuria, unspecified: Secondary | ICD-10-CM | POA: Diagnosis not present

## 2019-07-16 DIAGNOSIS — I480 Paroxysmal atrial fibrillation: Secondary | ICD-10-CM | POA: Diagnosis not present

## 2019-07-16 DIAGNOSIS — L508 Other urticaria: Secondary | ICD-10-CM | POA: Diagnosis not present

## 2019-07-16 LAB — POCT URINALYSIS DIPSTICK
Bilirubin, UA: NEGATIVE
Glucose, UA: NEGATIVE
Ketones, UA: NEGATIVE
Leukocytes, UA: NEGATIVE
Nitrite, UA: NEGATIVE
Protein, UA: NEGATIVE
Spec Grav, UA: 1.02 (ref 1.010–1.025)
Urobilinogen, UA: 0.2 E.U./dL
pH, UA: 6 (ref 5.0–8.0)

## 2019-07-16 MED ORDER — LEVOCETIRIZINE DIHYDROCHLORIDE 5 MG PO TABS: 2.5000 mg | ORAL_TABLET | Freq: Every evening | ORAL | 3 refills | Status: DC

## 2019-07-16 NOTE — Patient Instructions (Signed)
Hydrate.  We will call you with labs and discuss plan after we get the results of your labs. If you develop fever, chills, abdominal or back pain >> then please be seen immediately.    Ask Dr. Verlin Fester about the potential of orange or red dye as cause of hives.     Hematuria, Adult Hematuria is blood in the urine. Blood may be visible in the urine, or it may be identified with a test. This condition can be caused by infections of the bladder, urethra, kidney, or prostate. Other possible causes include:  Kidney stones.  Cancer of the urinary tract.  Too much calcium in the urine.  Conditions that are passed from parent to child (inherited conditions).  Exercise that requires a lot of energy. Infections can usually be treated with medicine, and a kidney stone usually will pass through your urine. If neither of these is the cause of your hematuria, more tests may be needed to identify the cause of your symptoms. It is very important to tell your health care provider about any blood in your urine, even if it is painless or the blood stops without treatment. Blood in the urine, when it happens and then stops and then happens again, can be a symptom of a very serious condition, including cancer. There is no pain in the initial stages of many urinary cancers. Follow these instructions at home: Medicines  Take over-the-counter and prescription medicines only as told by your health care provider.  If you were prescribed an antibiotic medicine, take it as told by your health care provider. Do not stop taking the antibiotic even if you start to feel better. Eating and drinking  Drink enough fluid to keep your urine clear or pale yellow. It is recommended that you drink 3-4 quarts (2.8-3.8 L) a day. If you have been diagnosed with an infection, it is recommended that you drink cranberry juice in addition to large amounts of water.  Avoid caffeine, tea, and carbonated beverages. These tend to  irritate the bladder.  Avoid alcohol because it may irritate the prostate (men). General instructions  If you have been diagnosed with a kidney stone, follow your health care provider's instructions about straining your urine to catch the stone.  Empty your bladder often. Avoid holding urine for long periods of time.  If you are female: ? After a bowel movement, wipe from front to back and use each piece of toilet paper only once. ? Empty your bladder before and after sex.  Pay attention to any changes in your symptoms. Tell your health care provider about any changes or any new symptoms.  It is your responsibility to get your test results. Ask your health care provider, or the department performing the test, when your results will be ready.  Keep all follow-up visits as told by your health care provider. This is important. Contact a health care provider if:  You develop back pain.  You have a fever.  You have nausea or vomiting.  Your symptoms do not improve after 3 days.  Your symptoms get worse. Get help right away if:  You develop severe vomiting and are unable take medicine without vomiting.  You develop severe pain in your back or abdomen even though you are taking medicine.  You pass a large amount of blood in your urine.  You pass blood clots in your urine.  You feel very weak or like you might faint.  You faint. Summary  Hematuria is blood in  the urine. It has many possible causes.  It is very important that you tell your health care provider about any blood in your urine, even if it is painless or the blood stops without treatment.  Take over-the-counter and prescription medicines only as told by your health care provider.  Drink enough fluid to keep your urine clear or pale yellow. This information is not intended to replace advice given to you by your health care provider. Make sure you discuss any questions you have with your health care provider.  Document Released: 09/12/2005 Document Revised: 08/25/2017 Document Reviewed: 10/15/2016 Elsevier Patient Education  2020 [employer].

## 2019-07-16 NOTE — Progress Notes (Signed)
[name] , Mar 08, 1961, 58 y.o., female MRN: [id number] Patient Care Team    Relationship Specialty Notifications Start End  [name], DO PCP - General Family Medicine  10/13/17   Constance Haw, MD PCP - Electrophysiology Cardiology Admissions 08/28/17   Satira Sark, MD PCP - Cardiology Cardiology Admissions 12/15/17   Rosalin Hawking, MD Consulting Physician Neurology  10/13/17   Jamse Arn, MD Consulting Physician Physical Medicine and Rehabilitation  10/13/17   Luanne Bras, MD Consulting Physician Interventional Radiology  10/13/17     Chief Complaint  Patient presents with  . Hematuria    thinks blood in urine is from medication reaction. she has stopped taking medcation for 3 days. she said that it is light in color. going to the restroom a lot. no discharge or odor     Subjective: Pt presents for an OV with complaints of hematuria since 10/13. She denies pain, fever, chills, nausea, urinary frequency, blood clots. She states she she has no symptoms and now has not even seen the blood in her urine for 2 days.    Of note it started when she started allegra and pepcid. She also had a more severe outbreak with hives around her abdomen at that time.   Depression screen [facility] 2/9 11/22/2018 10/22/2018 10/22/2018 10/19/2018 11/10/2017  Decreased Interest 0 0 0 0 0  Down, Depressed, Hopeless 0 0 0 0 0  PHQ - 2 Score 0 0 0 0 0  Altered sleeping - 0 - - 0  Tired, decreased energy - 1 - - 0  Change in appetite - 0 - - 0  Feeling bad or failure about yourself  - 0 - - 0  Trouble concentrating - 0 - - 0  Moving slowly or fidgety/restless - 0 - - 0  Suicidal thoughts - 0 - - 0  PHQ-9 Score - 1 - - 0  Difficult doing work/chores - Not difficult at all - - Not difficult at all  Some recent data might be hidden    Allergies  Allergen Reactions  . Lopressor [Metoprolol Tartrate] Swelling    Angioedema  . Allegra Allergy [Fexofenadine Hcl] Hives  . Nsaids      On Xarelto  . Gabapentin (Once-Daily) Rash  . Lipitor [Atorvastatin] Diarrhea   Social History   Social History Narrative   Married. 2 children.    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    Walks with cane or wheelchair.    Former smoker.    Drinks caffeine.    Smoke alarm in the home.   Firearms in the home.    Wears her seatbelt.    Feels safe In her relationships.    Past Medical History:  Diagnosis Date  . Angio-edema   . Arthritis   . CAD (coronary artery disease)    a. s/p NSTEMI in 10/2017 with cath showing LM disease --> s/p CABG on 11/23/2017 with LIMA-LAD and SVG-OM.   [name] [place] fever   . Ischemic cardiomyopathy    a. EF 35-40% by echo in 10/2017  . NSTEMI (non-ST elevated myocardial infarction) ([place])   . PAF (paroxysmal atrial fibrillation) ([place])    Diagnosed by ILR  . Pneumonia 11/16/2017  . Stroke [facility]) 08/23/2017   Left MCA infarct status post TPA and thrombectomy  . Urticaria    Past Surgical History:  Procedure Laterality Date  . CORONARY ARTERY BYPASS GRAFT N/A 11/23/2017   Procedure: CORONARY ARTERY BYPASS GRAFTING (CABG) x 2,  ON PUMP, USING LEFT INTERNAL MAMMARY ARTERY AND RIGHT GREATER SAPHENOUS VEIN HARVESTED ENDOSCOPICALLY;  Surgeon: Gaye Pollack, MD;  Location: [place];  Service: Open Heart Surgery;  Laterality: N/A;  . CRYOABLATION     cervical  . IR ANGIO INTRA EXTRACRAN SEL COM CAROTID INNOMINATE UNI R MOD SED  08/21/2017  . IR ANGIO VERTEBRAL SEL SUBCLAVIAN INNOMINATE UNI R MOD SED  08/21/2017  . IR PERCUTANEOUS ART THROMBECTOMY/INFUSION INTRACRANIAL INC DIAG ANGIO  08/21/2017  . IR RADIOLOGIST EVAL & MGMT  10/30/2017  . LOOP RECORDER INSERTION N/A 08/28/2017   Procedure: LOOP RECORDER INSERTION;  Surgeon: Constance Haw, MD;  Location: [place] CV LAB;  Service: Cardiovascular;  Laterality: N/A;  . RADIOLOGY WITH ANESTHESIA N/A 08/21/2017   Procedure: RADIOLOGY WITH ANESTHESIA;  Surgeon: Luanne Bras, MD;  Location: [place];  Service:  Radiology;  Laterality: N/A;  . RIGHT/LEFT HEART CATH AND CORONARY ANGIOGRAPHY N/A 11/20/2017   Procedure: RIGHT/LEFT HEART CATH AND CORONARY ANGIOGRAPHY;  Surgeon: Jettie Booze, MD;  Location: [place] CV LAB;  Service: Cardiovascular;  Laterality: N/A;  . TEE WITHOUT CARDIOVERSION N/A 08/28/2017   Procedure: TRANSESOPHAGEAL ECHOCARDIOGRAM (TEE);  Surgeon: Fay Records, MD;  Location: [place];  Service: Cardiovascular;  Laterality: N/A;  . TEE WITHOUT CARDIOVERSION N/A 11/23/2017   Procedure: TRANSESOPHAGEAL ECHOCARDIOGRAM (TEE);  Surgeon: Gaye Pollack, MD;  Location: [place];  Service: Open Heart Surgery;  Laterality: N/A;  . TUBAL LIGATION     Family History  Problem Relation Age of Onset  . Heart attack Father   . Hypertension Mother   . Hyperlipidemia Mother   . Diabetes type II Mother   . Urticaria Mother   . Hypertension Brother   . Breast cancer Cousin 60  . Immunodeficiency Neg Hx   . Eczema Neg Hx   . Atopy Neg Hx   . Asthma Neg Hx   . Angioedema Neg Hx   . Allergic rhinitis Neg Hx    Allergies as of 07/16/2019      Reactions   Lopressor [metoprolol Tartrate] Swelling   Angioedema   Allegra Allergy [fexofenadine Hcl] Hives   Nsaids    On Xarelto   Gabapentin (once-daily) Rash   Lipitor [atorvastatin] Diarrhea      Medication List       Accurate as of July 16, 2019  1:13 PM. If you have any questions, ask your nurse or doctor.        Azelastine-Fluticasone 137-50 MCG/ACT Susp Place 1 spray into both nostrils 2 (two) times daily as needed.   baclofen 10 MG tablet Commonly known as: LIORESAL TAKE 1 TABLET BY MOUTH THREE TIMES DAILY   calcium carbonate 500 MG chewable tablet Commonly known as: TUMS - dosed in mg elemental calcium Chew 1 tablet by mouth daily.   carvedilol 3.125 MG tablet Commonly known as: COREG Take 1 tablet (3.125 mg total) by mouth 2 (two) times daily.   cholecalciferol 1000 units tablet Commonly known as: VITAMIN  D Take 1,000 Units by mouth daily.   Entresto 24-26 MG Generic drug: sacubitril-valsartan Take 1 tablet by mouth twice daily   famotidine 20 MG tablet Commonly known as: Pepcid Take 1 tablet (20 mg total) by mouth 2 (two) times daily as needed for heartburn or indigestion.   ferrous sulfate 325 (65 FE) MG tablet Take 325 mg by mouth daily with breakfast. Taking twice weekly   fexofenadine 180 MG tablet Commonly known as: ALLEGRA Take 1 tablet (180  mg total) by mouth daily as needed for allergies or rhinitis.   furosemide 20 MG tablet Commonly known as: LASIX Take 0.5 tablets (10 mg total) by mouth daily.   hydrOXYzine 10 MG tablet Commonly known as: ATARAX/VISTARIL Take 1 tablet (10 mg total) by mouth at bedtime.   levocetirizine 5 MG tablet Commonly known as: Xyzal Take 0.5 tablets (2.5 mg total) by mouth every evening.   loratadine 10 MG tablet Commonly known as: CLARITIN Take 1 tablet (10 mg total) by mouth daily.   Potassium Chloride ER 20 MEQ Tbcr Take 1 tablet by mouth once daily   rosuvastatin 40 MG tablet Commonly known as: CRESTOR Take 1 tablet (40 mg total) by mouth daily.   triamcinolone cream 0.1 % Commonly known as: KENALOG Apply 1 application topically 2 (two) times daily.   vitamin B-12 100 MCG tablet Commonly known as: CYANOCOBALAMIN Take 100 mcg by mouth daily.   Xarelto 20 MG Tabs tablet Generic drug: rivaroxaban TAKE 1 TABLET BY MOUTH ONCE DAILY WITH SUPPER       All past medical history, surgical history, allergies, family history, immunizations andmedications were updated in the EMR today and reviewed under the history and medication portions of their EMR.     ROS: Negative, with the exception of above mentioned in HPI  Objective:  BP (!) 88/57 (BP Location: Left Arm, Patient Position: Sitting, Cuff Size: Normal)   Pulse 96   Temp 98.3 F (36.8 C) (Temporal)   Resp 17   Ht 5' 3" (1.6 m)   Wt 123 lb (55.8 kg)   LMP  (LMP Unknown)    SpO2 96%   BMI 21.79 kg/m  Body mass index is 21.79 kg/m. Gen: Afebrile. No acute distress. Nontoxic in appearance, well developed, well nourished.  HENT: AT. [place].  Eyes:Pupils Equal Round Reactive to light, Extraocular movements intact,  Conjunctiva without redness, discharge or icterus. Neck/lymp/endocrine: Supple,no lymphadenopathy CV: RRR, no edema Chest: CTAB, no wheeze or crackles. Good air movement, normal resp effort.  Abd: Soft. NTND. BS present. no Masses palpated. No rebound or guarding. MSK: no cva ttp bilaterally  Neuro:  Normal gait. PERLA. EOMi. Alert. Oriented x3  No exam data present No results found. Results for orders placed or performed in visit on 07/16/19 (from the past 24 hour(s))  POCT urinalysis dipstick     Status: Abnormal   Collection Time: 07/16/19  1:06 PM  Result Value Ref Range   Color, UA yellow    Clarity, UA clear    Glucose, UA Negative Negative   Bilirubin, UA neg    Ketones, UA neg    Spec Grav, UA 1.020 1.010 - 1.025   Blood, UA 3+    pH, UA 6.0 5.0 - 8.0   Protein, UA Negative Negative   Urobilinogen, UA 0.2 0.2 or 1.0 E.U./dL   Nitrite, UA neg    Leukocytes, UA Negative Negative   Appearance     Odor      Assessment/Plan: [name] is a 58 y.o. female present for OV for  Hematuria, unspecified type/smoker Significant hematuria in her urine today 3+. Sent for micro and reflex urine. BMP and CMP also collected today.  - will wait for results to decide on further management-- pt advised to report to ED if she develops pain, fever, worsening hematuria etc. Currently no associated symptoms with hematuria.  - she is a smoker. Low threshold to refer to Uro if does not resolve.  - Bp low-  pt asymptomatic and appears well. She has lower end pressures. She has an appt with cards next week- advised her to discuss. Could also consider adrenal insuff. As cause>> just finished prednisone pack last week.  - she is on eliquis.  - POCT  urinalysis dipstick - CBC w/Diff - Comp Met (CMET) - Urinalysis w microscopic + reflex cultur  Chronic urticaria - has seen allergist and has skin testing arranged. - she plans to perform a statin break after speaking with cardiology- this was the only common medicine group since onset of hives (lipitor>then crestor). Of note, she had a worsening of her hives after starting the pepcid and allegra. Depending on manufacture crestor, allegra and pepcid can all be orangish in color- possible dye allergy?   Reviewed expectations re: course of current medical issues.  Discussed self-management of symptoms.  Outlined signs and symptoms indicating need for more acute intervention.  Patient verbalized understanding and all questions were answered.  Patient received an After-Visit Summary.    Orders Placed This Encounter  Procedures  . POCT urinalysis dipstick   > 25 minutes spent with patient, >50% of time spent face to face     Note is dictated utilizing voice recognition software. Although note has been proof read prior to signing, occasional typographical errors still can be missed. If any questions arise, please do not hesitate to call for verification.   electronically signed by:  Howard Pouch, DO  [place]

## 2019-07-17 ENCOUNTER — Telehealth: Payer: Self-pay | Admitting: Family Medicine

## 2019-07-17 LAB — COMPREHENSIVE METABOLIC PANEL
ALT: 20 U/L (ref 0–35)
AST: 19 U/L (ref 0–37)
Albumin: 4.2 g/dL (ref 3.5–5.2)
Alkaline Phosphatase: 61 U/L (ref 39–117)
BUN: 15 mg/dL (ref 6–23)
CO2: 28 mEq/L (ref 19–32)
Calcium: 9.5 mg/dL (ref 8.4–10.5)
Chloride: 104 mEq/L (ref 96–112)
Creatinine, Ser: 0.68 mg/dL (ref 0.40–1.20)
GFR: 88.49 mL/min (ref >60.00)
Glucose, Bld: 80 mg/dL (ref 70–99)
Potassium: 4.1 mEq/L (ref 3.5–5.1)
Sodium: 138 mEq/L (ref 135–145)
Total Bilirubin: 0.4 mg/dL (ref 0.2–1.2)
Total Protein: 6.6 g/dL (ref 6.0–8.3)

## 2019-07-17 LAB — URINALYSIS W MICROSCOPIC + REFLEX CULTURE
Bacteria, UA: NONE SEEN /HPF
Bilirubin Urine: NEGATIVE
Glucose, UA: NEGATIVE
Hyaline Cast: NONE SEEN /LPF
Ketones, ur: NEGATIVE
Leukocyte Esterase: NEGATIVE
Nitrites, Initial: NEGATIVE
Protein, ur: NEGATIVE
Specific Gravity, Urine: 1.012 (ref 1.001–1.03)
Squamous Epithelial / HPF: NONE SEEN /HPF (ref <=5)
pH: 5.5 (ref 5.0–8.0)

## 2019-07-17 LAB — CUP PACEART REMOTE DEVICE CHECK
Date Time Interrogation Session: 20201021024133
Implantable Pulse Generator Implant Date: 20181203

## 2019-07-17 LAB — CBC WITH DIFFERENTIAL/PLATELET
Basophils Absolute: 0.1 10*3/uL (ref 0.0–0.1)
Basophils Relative: 1.1 % (ref 0.0–3.0)
Eosinophils Absolute: 0.2 10*3/uL (ref 0.0–0.7)
Eosinophils Relative: 2.6 % (ref 0.0–5.0)
HCT: 45.7 % (ref 36.0–46.0)
Hemoglobin: 15 g/dL (ref 12.0–15.0)
Lymphocytes Relative: 31 % (ref 12.0–46.0)
Lymphs Abs: 2.9 10*3/uL (ref 0.7–4.0)
MCHC: 32.8 g/dL (ref 30.0–36.0)
MCV: 98.9 fl (ref 78.0–100.0)
Monocytes Absolute: 0.8 10*3/uL (ref 0.1–1.0)
Monocytes Relative: 8.9 % (ref 3.0–12.0)
Neutro Abs: 5.3 10*3/uL (ref 1.4–7.7)
Neutrophils Relative %: 56.4 % (ref 43.0–77.0)
Platelets: 306 10*3/uL (ref 150.0–400.0)
RBC: 4.62 Mil/uL (ref 3.87–5.11)
RDW: 13.9 % (ref 11.5–15.5)
WBC: 9.5 10*3/uL (ref 4.0–10.5)

## 2019-07-17 LAB — NO CULTURE INDICATED

## 2019-07-18 ENCOUNTER — Ambulatory Visit: Payer: BC Managed Care – PPO | Admitting: Cardiology

## 2019-07-18 ENCOUNTER — Encounter: Payer: Self-pay | Admitting: Cardiology

## 2019-07-18 ENCOUNTER — Other Ambulatory Visit: Payer: Self-pay

## 2019-07-18 VITALS — BP 112/67 | HR 99 | Temp 96.6°F | Ht 61.0 in | Wt 129.0 lb

## 2019-07-18 DIAGNOSIS — I255 Ischemic cardiomyopathy: Secondary | ICD-10-CM | POA: Diagnosis not present

## 2019-07-18 DIAGNOSIS — I6523 Occlusion and stenosis of bilateral carotid arteries: Secondary | ICD-10-CM

## 2019-07-18 DIAGNOSIS — E782 Mixed hyperlipidemia: Secondary | ICD-10-CM

## 2019-07-18 DIAGNOSIS — I25119 Atherosclerotic heart disease of native coronary artery with unspecified angina pectoris: Secondary | ICD-10-CM

## 2019-07-18 MED ORDER — ROSUVASTATIN CALCIUM 40 MG PO TABS: 40.0000 mg | ORAL_TABLET | Freq: Every day | ORAL | 3 refills | Status: DC

## 2019-07-18 NOTE — Patient Instructions (Signed)
Medication Instructions:  HOLD Crestor until further notice  *If you need a refill on your cardiac medications before your next appointment, please call your pharmacy*  Lab Work: None today If you have labs (blood work) drawn today and your tests are completely normal, you will receive your results only by: [name] MyChart Message (if you have MyChart) OR . A paper copy in the mail If you have any lab test that is abnormal or we need to change your treatment, we will call you to review the results.  Testing/Procedures: Your physician has requested that you have an echocardiogram. Echocardiography is a painless test that uses sound waves to create images of your heart. It provides your doctor with information about the size and shape of your heart and how well your heart's chambers and valves are working. This procedure takes approximately one hour. There are no restrictions for this procedure.    Follow-Up: At [facility], you and your health needs are our priority.  As part of our continuing mission to provide you with exceptional heart care, we have created designated Provider Care Teams.  These Care Teams include your primary Cardiologist (physician) and Advanced Practice Providers (APPs -  Physician Assistants and Nurse Practitioners) who all work together to provide you with the care you need, when you need it.  Your next appointment:   3 months  The format for your next appointment:   In Person  Provider:   Bernerd Pho, PA-C  Other Instructions None      Thank you for choosing [facility] !       [name]

## 2019-07-18 NOTE — Progress Notes (Signed)
Cardiology Office Note  Date: 07/18/2019   ID: [name], DOB December 17, 1960, MRN [id number]  PCP:  [name], DO  Cardiologist:  [name], MD Electrophysiologist:  Constance Haw, MD   Chief Complaint  Patient presents with  . Cardiac follow-up    History of Present Illness: [name] is a 58 y.o. female last seen in January by [name] PA-C.  She presents for a routine follow-up visit.  From a cardiac perspective, she does not report any angina symptoms, no palpitations or syncope.  She has relatively low blood pressures at baseline, does not check it regularly at home.  It is not entirely clear that she is symptomatic however, no dizziness.  We discussed obtaining a follow-up echocardiogram for reevaluation of LVEF.  Echocardiogram from August of last year revealed LVEF 35 to 40% range.  She has been on low-dose Coreg and Entresto, these have not been titrated due to relatively low blood pressure.  She is also on very low-dose diuretics.  She is in the process of undergoing allergy evaluation for recurrent urticaria.  I reviewed the recent note from Dr. Verlin Fester.  It was requested that she hold her Crestor at least temporarily to see if this helps with symptoms, she has additional allergy testing scheduled.  She has an ILR in place, followed by Dr. Curt Bears with prior history of stroke, no documented atrial fibrillation.  Past Medical History:  Diagnosis Date  . Angio-edema   . Arthritis   . CAD (coronary artery disease)    a. s/p NSTEMI in 10/2017 with cath showing LM disease --> s/p CABG on 11/23/2017 with LIMA-LAD and SVG-OM.   [name] Hay fever   . Ischemic cardiomyopathy    a. EF 35-40% by echo in 10/2017  . NSTEMI (non-ST elevated myocardial infarction) ([place])   . PAF (paroxysmal atrial fibrillation) ([place])    Diagnosed by ILR  . Pneumonia 11/16/2017  . 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 Urticaria      Past Surgical History:  Procedure Laterality Date  . CORONARY ARTERY BYPASS GRAFT N/A 11/23/2017   Procedure: CORONARY ARTERY BYPASS GRAFTING (CABG) x 2, ON PUMP, USING LEFT INTERNAL MAMMARY ARTERY AND RIGHT GREATER SAPHENOUS VEIN HARVESTED ENDOSCOPICALLY;  Surgeon: Gaye Pollack, MD;  Location: [place];  Service: Open Heart Surgery;  Laterality: N/A;  . CRYOABLATION     cervical  . IR ANGIO INTRA EXTRACRAN SEL COM CAROTID INNOMINATE UNI R MOD SED  08/21/2017  . IR ANGIO VERTEBRAL SEL SUBCLAVIAN INNOMINATE UNI R MOD SED  08/21/2017  . IR PERCUTANEOUS ART THROMBECTOMY/INFUSION INTRACRANIAL INC DIAG ANGIO  08/21/2017  . IR RADIOLOGIST EVAL & MGMT  10/30/2017  . LOOP RECORDER INSERTION N/A 08/28/2017   Procedure: LOOP RECORDER INSERTION;  Surgeon: Constance Haw, MD;  Location: [place] CV LAB;  Service: Cardiovascular;  Laterality: N/A;  . RADIOLOGY WITH ANESTHESIA N/A 08/21/2017   Procedure: RADIOLOGY WITH ANESTHESIA;  Surgeon: Luanne Bras, MD;  Location: [place];  Service: Radiology;  Laterality: N/A;  . RIGHT/LEFT HEART CATH AND CORONARY ANGIOGRAPHY N/A 11/20/2017   Procedure: RIGHT/LEFT HEART CATH AND CORONARY ANGIOGRAPHY;  Surgeon: Jettie Booze, MD;  Location: [place] CV LAB;  Service: Cardiovascular;  Laterality: N/A;  . TEE WITHOUT CARDIOVERSION N/A 08/28/2017   Procedure: TRANSESOPHAGEAL ECHOCARDIOGRAM (TEE);  Surgeon: Fay Records, MD;  Location[facility] ENDOSCOPY;  Service: Cardiovascular;  Laterality: N/A;  . TEE  WITHOUT CARDIOVERSION N/A 11/23/2017   Procedure: TRANSESOPHAGEAL ECHOCARDIOGRAM (TEE);  Surgeon: Gaye Pollack, MD;  Location: [place];  Service: Open Heart Surgery;  Laterality: N/A;  . TUBAL LIGATION      Current Outpatient Medications  Medication Sig Dispense Refill  . Azelastine-Fluticasone 137-50 MCG/ACT SUSP Place 1 spray into both nostrils 2 (two) times daily as needed. 23 g 5  . baclofen (LIORESAL) 10 MG tablet TAKE 1 TABLET BY MOUTH THREE TIMES  DAILY 90 tablet 1  . calcium carbonate (TUMS - DOSED IN MG ELEMENTAL CALCIUM) 500 MG chewable tablet Chew 1 tablet by mouth daily.    . carvedilol (COREG) 3.125 MG tablet Take 1 tablet (3.125 mg total) by mouth 2 (two) times daily. 180 tablet 3  . cholecalciferol (VITAMIN D) 1000 units tablet Take 1,000 Units by mouth daily.    [name] ENTRESTO 24-26 MG Take 1 tablet by mouth twice daily 60 tablet 3  . ferrous sulfate 325 (65 FE) MG tablet Take 325 mg by mouth daily with breakfast. Taking twice weekly    . furosemide (LASIX) 20 MG tablet Take 0.5 tablets (10 mg total) by mouth daily. 45 tablet 1  . hydrOXYzine (ATARAX/VISTARIL) 10 MG tablet Take 1 tablet (10 mg total) by mouth at bedtime. 90 tablet 1  . levocetirizine (XYZAL) 5 MG tablet Take 0.5 tablets (2.5 mg total) by mouth every evening. 45 tablet 3  . loratadine (CLARITIN) 10 MG tablet Take 1 tablet (10 mg total) by mouth daily. 30 tablet 11  . Potassium Chloride ER 20 MEQ TBCR Take 1 tablet by mouth once daily 30 tablet 0  . rosuvastatin (CRESTOR) 40 MG tablet Take 1 tablet (40 mg total) by mouth daily. 07/18/2019 HOLD Crestor until further notice 90 tablet 3  . triamcinolone cream (KENALOG) 0.1 % Apply 1 application topically 2 (two) times daily. 30 g 2  . vitamin B-12 (CYANOCOBALAMIN) 100 MCG tablet Take 100 mcg by mouth daily.    Alveda Reasons 20 MG TABS tablet TAKE 1 TABLET BY MOUTH ONCE DAILY WITH SUPPER 90 tablet 1   No current facility-administered medications for this visit.    Allergies:  Lopressor [metoprolol tartrate], Allegra allergy [fexofenadine hcl], Nsaids, Gabapentin (once-daily), and Lipitor [atorvastatin]   Social History: The patient  reports that she quit smoking about a year ago. Her smoking use included cigarettes. She has a 3.70 pack-year smoking history. She has never used smokeless tobacco. She reports that she does not drink alcohol or use drugs.   ROS:  Please see the history of present illness. Otherwise, complete  review of systems is positive for none.  All other systems are reviewed and negative.   Physical Exam: VS:  BP 112/67   Pulse 99   Temp (!) 96.6 F (35.9 C)   Ht 5\' 1"  (1.549 m)   Wt 129 lb (58.5 kg)   LMP  (LMP Unknown)   SpO2 94%   BMI 24.37 kg/m , BMI Body mass index is 24.37 kg/m.  Wt Readings from Last 3 Encounters:  07/18/19 129 lb (58.5 kg)  07/16/19 123 lb (55.8 kg)  07/12/19 128 lb (58.1 kg)    General: Patient appears comfortable at rest. HEENT: Conjunctiva and lids normal, wearing a mask. Neck: Supple, no elevated JVP or carotid bruits, no thyromegaly. Lungs: Clear to auscultation, nonlabored breathing at rest. Cardiac: Regular rate and rhythm, no S3 or significant systolic murmur, no pericardial rub. Abdomen: Soft, nontender, bowel sounds present. Extremities: No pitting edema, distal  pulses 2+. Skin: Warm and dry. Musculoskeletal: No kyphosis. Neuropsychiatric: Alert and oriented x3, affect grossly appropriate.  ECG:  An ECG dated 03/15/2018 was personally reviewed today and demonstrated:  Sinus tachycardia with left atrial enlargement and decreased R wave progression.  Recent Labwork: 07/16/2019: ALT 20; AST 19; BUN 15; Creatinine, Ser 0.68; Hemoglobin 15.0; Platelets 306.0; Potassium 4.1; Sodium 138     Component Value Date/Time   CHOL 177 10/22/2018 1443   TRIG 166.0 (H) 10/22/2018 1443   HDL 49.40 10/22/2018 1443   CHOLHDL 4 10/22/2018 1443   VLDL 33.2 10/22/2018 1443   LDLCALC 94 10/22/2018 1443    Other Studies Reviewed Today:  Carotid Dopplers 11/27/2018: Summary: Right Carotid: Velocities in the right ICA are consistent with a 1-39% stenosis.                Non-hemodynamically significant plaque <50% noted in the CCA. The                ECA appears <50% stenosed. Decreased category of stenosis when                compared to previous exam.  Left Carotid: Velocities in the left ICA are consistent with a 40-59% stenosis.                Non-hemodynamically significant plaque noted in the CCA. The ECA               appears <50% stenosed. Essentially unchanged compared to previous               exam.  Vertebrals:  Right vertebral artery demonstrates antegrade flow. Left vertebral              artery was not visualized, this is unchanged when compared to              previous exam. Subclavians: Normal flow hemodynamics were seen in bilateral subclavian              arteries.  Echocardiogram 05/15/2018: Study Conclusions  - Left ventricle: The cavity size was normal. Wall thickness was   increased in a pattern of mild LVH. Systolic function was   moderately reduced. The estimated ejection fraction was in the   range of 35% to 40%. Diffuse hypokinesis. Doppler parameters are   consistent with abnormal left ventricular relaxation (grade 1   diastolic dysfunction). - Aortic valve: Mildly calcified annulus. Mildly thickened   leaflets. Valve area (VTI): 1.69 cm^2. Valve area (Vmax): 1.71   cm^2. Valve area (Vmean): 1.62 cm^2. - Mitral valve: Mildly calcified annulus. Mildly thickened leaflets   . There was mild regurgitation. - Right ventricle: Systolic function was mildly reduced. - Atrial septum: No defect or patent foramen ovale was identified. - Technically adequate study.  Assessment and Plan:  1.  Multivessel CAD status post CABG in February 2019.  She reports no active angina at this time on medical therapy.  Currently not on aspirin given concurrent use of Xarelto.  She otherwise has continued on low-dose beta-blocker and statin.  2.  Mixed hyperlipidemia, on Crestor.  She is in the process of undergoing an allergy evaluation for recurrent urticaria.  It was requested that she hold Crestor temporarily, we discussed this today and I am in agreement for now.  If there is a correlation to her recurring urticaria, we could always pick a different statin preparation or possibly PCSK9 inhibitor.  3.  Paroxysmal atrial  fibrillation.  She is asymptomatic  in terms of palpitations.  Continue Xarelto for stroke prophylaxis.  4.  Ischemic cardiomyopathy, LVEF 35 to 40% by echocardiogram in August of last year.  We will obtain an updated study.  She has a relatively low blood pressure and is on low-dose Coreg and Entresto.  May require further medication adjustments.  5.  History of stroke with residual right-sided weakness.  6.  Carotid artery disease, Dopplers from March demonstrated overall mild to moderate stenosis.  Medication Adjustments/Labs and Tests Ordered: Current medicines are reviewed at length with the patient today.  Concerns regarding medicines are outlined above.   Tests Ordered: Orders Placed This Encounter  Procedures  . ECHOCARDIOGRAM COMPLETE    Medication Changes: Meds ordered this encounter  Medications  . rosuvastatin (CRESTOR) 40 MG tablet    Sig: Take 1 tablet (40 mg total) by mouth daily. 07/18/2019 HOLD Crestor until further notice    Dispense:  90 tablet    Refill:  3    Disposition:  Follow up 2 to 3 months with [place] in the [place] office.  Signed, Satira Sark, MD, [facility] 07/18/2019 10:24 AM    [facility] [place]. [address], [place], [place] [address] Phone: [phone]; Fax: [phone]

## 2019-07-18 NOTE — Telephone Encounter (Signed)
Error see lab result tab for message

## 2019-07-25 ENCOUNTER — Other Ambulatory Visit: Payer: Self-pay | Admitting: Family Medicine

## 2019-07-31 NOTE — Progress Notes (Signed)
[facility] Summary Report / Loop Recorder 

## 2019-08-01 ENCOUNTER — Ambulatory Visit (HOSPITAL_COMMUNITY)
Admission: RE | Admit: 2019-08-01 | Discharge: 2019-08-01 | Disposition: A | Discharge status: Home / Self Care | Payer: BC Managed Care – PPO | Source: Ambulatory Visit | Attending: Cardiology | Admitting: Cardiology

## 2019-08-01 ENCOUNTER — Other Ambulatory Visit: Payer: Self-pay

## 2019-08-01 DIAGNOSIS — I255 Ischemic cardiomyopathy: Secondary | ICD-10-CM

## 2019-08-01 NOTE — Progress Notes (Signed)
*  PRELIMINARY RESULTS* Echocardiogram 2D Echocardiogram has been performed.  [name] 08/01/2019, 1:57 PM

## 2019-08-14 ENCOUNTER — Other Ambulatory Visit: Payer: Self-pay | Admitting: *Deleted

## 2019-08-14 ENCOUNTER — Telehealth: Payer: Self-pay | Admitting: Cardiology

## 2019-08-14 MED ORDER — ENTRESTO 24-26 MG PO TABS: 1.0000 | ORAL_TABLET | Freq: Two times a day (BID) | ORAL | 3 refills | Status: DC

## 2019-08-14 NOTE — Telephone Encounter (Signed)
Left message stating she's needing a Rx for her ENTRESTO 24-26 MG [id number]

## 2019-08-14 NOTE — Telephone Encounter (Signed)
Refill complete 

## 2019-08-19 ENCOUNTER — Ambulatory Visit: Payer: BC Managed Care – PPO | Admitting: Family Medicine

## 2019-08-19 ENCOUNTER — Ambulatory Visit (INDEPENDENT_AMBULATORY_CARE_PROVIDER_SITE_OTHER): Payer: BC Managed Care – PPO | Admitting: *Deleted

## 2019-08-19 DIAGNOSIS — I63512 Cerebral infarction due to unspecified occlusion or stenosis of left middle cerebral artery: Secondary | ICD-10-CM

## 2019-08-19 LAB — CUP PACEART REMOTE DEVICE CHECK
Date Time Interrogation Session: 20201122214310
Implantable Pulse Generator Implant Date: 20181203

## 2019-08-20 ENCOUNTER — Ambulatory Visit
Admission: RE | Admit: 2019-08-20 | Discharge: 2019-08-20 | Disposition: A | Discharge status: Home / Self Care | Payer: BC Managed Care – PPO | Source: Ambulatory Visit | Attending: Family Medicine | Admitting: Family Medicine

## 2019-08-20 ENCOUNTER — Other Ambulatory Visit: Payer: Self-pay

## 2019-08-20 DIAGNOSIS — Z1231 Encounter for screening mammogram for malignant neoplasm of breast: Secondary | ICD-10-CM

## 2019-08-27 ENCOUNTER — Other Ambulatory Visit: Payer: Self-pay | Admitting: Family Medicine

## 2019-08-27 ENCOUNTER — Encounter: Payer: Self-pay | Admitting: Physical Medicine & Rehabilitation

## 2019-08-27 ENCOUNTER — Other Ambulatory Visit: Payer: Self-pay

## 2019-08-27 ENCOUNTER — Encounter
Payer: BC Managed Care – PPO | Attending: Physical Medicine & Rehabilitation | Admitting: Physical Medicine & Rehabilitation

## 2019-08-27 VITALS — BP 88/61 | HR 101 | Temp 97.7°F | Ht 61.0 in | Wt 124.0 lb

## 2019-08-27 DIAGNOSIS — G5602 Carpal tunnel syndrome, left upper limb: Secondary | ICD-10-CM

## 2019-08-27 DIAGNOSIS — R269 Unspecified abnormalities of gait and mobility: Secondary | ICD-10-CM | POA: Diagnosis not present

## 2019-08-27 DIAGNOSIS — M170 Bilateral primary osteoarthritis of knee: Secondary | ICD-10-CM | POA: Insufficient documentation

## 2019-08-27 DIAGNOSIS — Z951 Presence of aortocoronary bypass graft: Secondary | ICD-10-CM

## 2019-08-27 DIAGNOSIS — G8110 Spastic hemiplegia affecting unspecified side: Secondary | ICD-10-CM | POA: Diagnosis not present

## 2019-08-27 DIAGNOSIS — I63512 Cerebral infarction due to unspecified occlusion or stenosis of left middle cerebral artery: Secondary | ICD-10-CM

## 2019-08-27 NOTE — Progress Notes (Signed)
Subjective:    Patient ID: [name], female    DOB: 02/18/1961, 58 y.o.   MRN: [id number]  HPI Right-handed female with unremarkable past history except tobacco abuse, on no prescription medication, left MCA infarct status post TPA and thrombectomy presents for follow up for cardiac debility s/p CABG and stroke.    Last clinic visit 07/12/2019.[name]  Since that time, rash has improved.  She still has not followed up with Allergist. She is taking Fluoxetine. She completed therapies. She is doing some HEP. Had a fall trying to put something in an elevated card. Knees are a same.   Pain Inventory Average Pain 5 Pain Right Now 5 My pain is burning and tingling  In the last 24 hours, has pain interfered with the following? General activity 5 Relation with others 5 Enjoyment of life 6 What TIME of day is your pain at its worst? morning Sleep (in general) Fair  Pain is worse with: walking and standing Pain improves with: rest, medication and injections Relief from Meds: 5  Mobility walk without assistance how many minutes can you walk? 15 ability to climb steps?  yes do you drive?  yes Do you have any goals in this area?  yes  Function retired I need assistance with the following:  meal prep and household duties  Neuro/Psych No problems in this area  Prior Studies Any changes since last visit?  no  Physicians involved in your care Any changes since last visit?  no   Family History  Problem Relation Age of Onset  . Heart attack Father   . Hypertension Mother   . Hyperlipidemia Mother   . Diabetes type II Mother   . Urticaria Mother   . Hypertension Brother   . Breast cancer Cousin 40  . Immunodeficiency Neg Hx   . Eczema Neg Hx   . Atopy Neg Hx   . Asthma Neg Hx   . Angioedema Neg Hx   . Allergic rhinitis Neg Hx    Social History   Socioeconomic History  . Marital status: Married    Spouse name: Not on file  . Number of children: 2  . Years of  education: Not on file  . Highest education level: Not on file  Occupational History  . Not on file  Social Needs  . Financial resource strain: Not on file  . Food insecurity    Worry: Not on file    Inability: Not on file  . Transportation needs    Medical: Not on file    Non-medical: Not on file  Tobacco Use  . Smoking status: Former Smoker    Packs/day: 0.10    Years: 37.00    Pack years: 3.70    Types: Cigarettes    Quit date: 07/17/2018    Years since quitting: 1.1  . Smokeless tobacco: Never Used  Substance and Sexual Activity  . Alcohol use: No    Frequency: Never  . Drug use: No  . Sexual activity: Yes    Partners: Male  Lifestyle  . Physical activity    Days per week: Not on file    Minutes per session: Not on file  . Stress: Not on file  Relationships  . Social Herbalist on phone: Patient refused    Gets together: Patient refused    Attends religious service: Patient refused    Active member of club or organization: Patient refused    Attends meetings of clubs or  organizations: Patient refused    Relationship status: Patient refused  Other Topics Concern  . Not on file  Social History Narrative   Married. 2 children.    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    Walks with cane or wheelchair.    Former smoker.    Drinks caffeine.    Smoke alarm in the home.   Firearms in the home.    Wears her seatbelt.    Feels safe In her relationships.    Past Surgical History:  Procedure Laterality Date  . CORONARY ARTERY BYPASS GRAFT N/A 11/23/2017   Procedure: CORONARY ARTERY BYPASS GRAFTING (CABG) x 2, ON PUMP, USING LEFT INTERNAL MAMMARY ARTERY AND RIGHT GREATER SAPHENOUS VEIN HARVESTED ENDOSCOPICALLY;  Surgeon: Gaye Pollack, MD;  Location: [facility];  Service: Open Heart Surgery;  Laterality: N/A;  . CRYOABLATION     cervical  . IR ANGIO INTRA EXTRACRAN SEL COM CAROTID INNOMINATE UNI R MOD SED  08/21/2017  . IR ANGIO VERTEBRAL SEL SUBCLAVIAN INNOMINATE  UNI R MOD SED  08/21/2017  . IR PERCUTANEOUS ART THROMBECTOMY/INFUSION INTRACRANIAL INC DIAG ANGIO  08/21/2017  . IR RADIOLOGIST EVAL & MGMT  10/30/2017  . LOOP RECORDER INSERTION N/A 08/28/2017   Procedure: LOOP RECORDER INSERTION;  Surgeon: Constance Haw, MD;  Location: [place] CV LAB;  Service: Cardiovascular;  Laterality: N/A;  . RADIOLOGY WITH ANESTHESIA N/A 08/21/2017   Procedure: RADIOLOGY WITH ANESTHESIA;  Surgeon: Luanne Bras, MD;  Location: [place];  Service: Radiology;  Laterality: N/A;  . RIGHT/LEFT HEART CATH AND CORONARY ANGIOGRAPHY N/A 11/20/2017   Procedure: RIGHT/LEFT HEART CATH AND CORONARY ANGIOGRAPHY;  Surgeon: Jettie Booze, MD;  Location: [place] CV [facility];  Service: Cardiovascular;  Laterality: N/A;  . TEE WITHOUT CARDIOVERSION N/A 08/28/2017   Procedure: TRANSESOPHAGEAL ECHOCARDIOGRAM (TEE);  Surgeon: Fay Records, MD;  Location: [place];  Service: Cardiovascular;  Laterality: N/A;  . TEE WITHOUT CARDIOVERSION N/A 11/23/2017   Procedure: TRANSESOPHAGEAL ECHOCARDIOGRAM (TEE);  Surgeon: Gaye Pollack, MD;  Location: [place];  Service: Open Heart Surgery;  Laterality: N/A;  . TUBAL LIGATION     Past Medical History:  Diagnosis Date  . Angio-edema   . Arthritis   . CAD (coronary artery disease)    a. s/p NSTEMI in 10/2017 with cath showing LM disease --> s/p CABG on 11/23/2017 with LIMA-LAD and SVG-OM.   [name] Hay fever   . Ischemic cardiomyopathy    a. EF 35-40% by echo in 10/2017  . NSTEMI (non-ST elevated myocardial infarction) ([place])   . PAF (paroxysmal atrial fibrillation) ([place])    Diagnosed by ILR  . Pneumonia 11/16/2017  . 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 Urticaria    BP (!) 88/61   Pulse (!) 101   Temp 97.7 F (36.5 C)   Ht 5\' 1"  (1.549 m)   Wt 124 lb (56.2 kg)   LMP  (LMP Unknown)   SpO2 94%   BMI 23.43 kg/m   Opioid Risk Score:   Fall Risk Score:  `1  Depression screen PHQ 2/9   Depression screen [facility] 2/9 11/22/2018 10/22/2018 10/22/2018 10/19/2018 11/10/2017 11/10/2017  Decreased Interest 0 0 0 0 0 0  Down, Depressed, Hopeless 0 0 0 0 0 0  PHQ - 2 Score 0 0 0 0 0 0  Altered sleeping - 0 - - 0 -  Tired, decreased energy - 1 - - 0 -  Change in appetite -  0 - - 0 -  Feeling bad or failure about yourself  - 0 - - 0 -  Trouble concentrating - 0 - - 0 -  Moving slowly or fidgety/restless - 0 - - 0 -  Suicidal thoughts - 0 - - 0 -  PHQ-9 Score - 1 - - 0 -  Difficult doing work/chores - Not difficult at all - - Not difficult at all -  Some recent data might be hidden   Review of Systems  Constitutional: Negative.   HENT: Negative.   Eyes: Negative.   Respiratory: Negative.   Gastrointestinal: Negative.   Endocrine: Negative.   Genitourinary: Negative.   Musculoskeletal: Positive for arthralgias and gait problem.  Skin: Negative.   Allergic/Immunologic: Negative.   Neurological: Positive for weakness and numbness.  Hematological: Negative.   Psychiatric/Behavioral: Negative.       Objective:   Physical Exam Constitutional: No distress . Vital signs reviewed. HENT: Normocephalic.  Atraumatic. Eyes: EOMI. No discharge. Cardiovascular: No JVD. Respiratory: Normal effort.  No stridor. GI: Non-distended. Skin: Warm and dry.  Intact. Psych: Normal mood.  Normal behavior. Musc: No edema in extremities.  No tenderness in extremities. Neurological: AlertMotor: Right upper extremity: 3-/5 shoulder abduction, 4/5 elbow flex/ext, 4/5 hand grip  Right lower extremity: Hip flexion 4+/5, knee extension 4+/5, ADF/PF 4+/5  MAS: elbow flexors 1/4, distally 0/4     Assessment & Plan:  Right-handed female with unremarkable past history except tobacco abuse, on no prescription medication, presents for follow up for left MCA infarct status post TPA and thrombectomy and cardiac debility.  1. Right hemiplegia, dysphagia, hypophonia secondary to acute left MCA infarct status  post TPA and thrombectomy. Status post loop recorder  Patient states more discomfort/irration PRAFO/WHO/AFO, awaiting resolution of rash  Cont Fluoxetine  She followed up with Vascular, no further intervention at present  Cont HEP  2. Spastic hemiplegia  Cont Baclofen to 10 TID (typically takes BID), good benefit  Will hold off on Botox at this time  3. Neurologic gait abnormality  With foot drag, improved with cognizant ambulation  No longer requiring quad cane  Encouraged use of AFO, after resolution of rash  Cont therapies  4. Bilateral knee OA  B/l Zilretta injections performed on 03/23/18 - with good benefit  Encouraged trial Lidoderm patch, reminded againx4  Xrays reviewed, medial > lateral OA b/l  Synvisc b/l on 10/05/2018 with benefit  Steroid injection on 5/28 with benefit (did not use Zilretta due to rash), but did not last as long as Zilretta injection  Cont Voltaren gel   Will consider Synvisc after allergy appointment  Will likely require referral to Ortho  5. Left CTS  Allergic to Gabapentin  Will consider Pregabalin after Allergy appointment  NCS/EMG showing L>R CTS  Cont bracing   Pt would like trial meds before referral for steroid injection  6. Rash  Follow up with Allergy, needs to make appointment

## 2019-09-02 ENCOUNTER — Other Ambulatory Visit: Payer: Self-pay | Admitting: Cardiology

## 2019-09-03 ENCOUNTER — Ambulatory Visit: Payer: BC Managed Care – PPO | Admitting: Allergy and Immunology

## 2019-09-10 ENCOUNTER — Inpatient Hospital Stay (HOSPITAL_COMMUNITY)
Admission: EM | Admit: 2019-09-10 | Discharge: 2019-09-13 | DRG: 871 | Disposition: A | Discharge status: Home / Self Care | Payer: BC Managed Care – PPO | Source: Ambulatory Visit | Attending: Internal Medicine | Admitting: Internal Medicine

## 2019-09-10 ENCOUNTER — Ambulatory Visit (INDEPENDENT_AMBULATORY_CARE_PROVIDER_SITE_OTHER): Payer: BC Managed Care – PPO | Admitting: Family Medicine

## 2019-09-10 ENCOUNTER — Other Ambulatory Visit: Payer: Self-pay

## 2019-09-10 ENCOUNTER — Encounter (HOSPITAL_COMMUNITY): Payer: Self-pay | Admitting: Internal Medicine

## 2019-09-10 ENCOUNTER — Encounter: Payer: Self-pay | Admitting: Family Medicine

## 2019-09-10 ENCOUNTER — Emergency Department (HOSPITAL_COMMUNITY): Payer: BC Managed Care – PPO

## 2019-09-10 ENCOUNTER — Ambulatory Visit: Payer: BC Managed Care – PPO | Admitting: Allergy and Immunology

## 2019-09-10 VITALS — BP 83/56 | HR 108 | Temp 98.1°F | Resp 18 | Ht 61.0 in | Wt 122.2 lb

## 2019-09-10 DIAGNOSIS — I2510 Atherosclerotic heart disease of native coronary artery without angina pectoris: Secondary | ICD-10-CM | POA: Diagnosis not present

## 2019-09-10 DIAGNOSIS — Z951 Presence of aortocoronary bypass graft: Secondary | ICD-10-CM | POA: Diagnosis not present

## 2019-09-10 DIAGNOSIS — I214 Non-ST elevation (NSTEMI) myocardial infarction: Secondary | ICD-10-CM | POA: Diagnosis present

## 2019-09-10 DIAGNOSIS — Z8701 Personal history of pneumonia (recurrent): Secondary | ICD-10-CM | POA: Diagnosis not present

## 2019-09-10 DIAGNOSIS — R140 Abdominal distension (gaseous): Secondary | ICD-10-CM

## 2019-09-10 DIAGNOSIS — I480 Paroxysmal atrial fibrillation: Secondary | ICD-10-CM | POA: Diagnosis present

## 2019-09-10 DIAGNOSIS — K862 Cyst of pancreas: Secondary | ICD-10-CM

## 2019-09-10 DIAGNOSIS — E871 Hypo-osmolality and hyponatremia: Secondary | ICD-10-CM | POA: Diagnosis not present

## 2019-09-10 DIAGNOSIS — K8500 Idiopathic acute pancreatitis without necrosis or infection: Secondary | ICD-10-CM | POA: Diagnosis not present

## 2019-09-10 DIAGNOSIS — I69331 Monoplegia of upper limb following cerebral infarction affecting right dominant side: Secondary | ICD-10-CM

## 2019-09-10 DIAGNOSIS — E860 Dehydration: Secondary | ICD-10-CM

## 2019-09-10 DIAGNOSIS — I252 Old myocardial infarction: Secondary | ICD-10-CM | POA: Diagnosis present

## 2019-09-10 DIAGNOSIS — Z8249 Family history of ischemic heart disease and other diseases of the circulatory system: Secondary | ICD-10-CM | POA: Diagnosis not present

## 2019-09-10 DIAGNOSIS — A419 Sepsis, unspecified organism: Principal | ICD-10-CM | POA: Diagnosis present

## 2019-09-10 DIAGNOSIS — M1990 Unspecified osteoarthritis, unspecified site: Secondary | ICD-10-CM | POA: Diagnosis not present

## 2019-09-10 DIAGNOSIS — I255 Ischemic cardiomyopathy: Secondary | ICD-10-CM | POA: Diagnosis present

## 2019-09-10 DIAGNOSIS — Z7901 Long term (current) use of anticoagulants: Secondary | ICD-10-CM

## 2019-09-10 DIAGNOSIS — Z8349 Family history of other endocrine, nutritional and metabolic diseases: Secondary | ICD-10-CM

## 2019-09-10 DIAGNOSIS — I110 Hypertensive heart disease with heart failure: Secondary | ICD-10-CM | POA: Diagnosis not present

## 2019-09-10 DIAGNOSIS — K859 Acute pancreatitis without necrosis or infection, unspecified: Secondary | ICD-10-CM | POA: Diagnosis present

## 2019-09-10 DIAGNOSIS — I482 Chronic atrial fibrillation, unspecified: Secondary | ICD-10-CM | POA: Diagnosis present

## 2019-09-10 DIAGNOSIS — Z20828 Contact with and (suspected) exposure to other viral communicable diseases: Secondary | ICD-10-CM | POA: Diagnosis present

## 2019-09-10 DIAGNOSIS — R109 Unspecified abdominal pain: Secondary | ICD-10-CM | POA: Diagnosis not present

## 2019-09-10 DIAGNOSIS — E785 Hyperlipidemia, unspecified: Secondary | ICD-10-CM | POA: Diagnosis not present

## 2019-09-10 DIAGNOSIS — K30 Functional dyspepsia: Secondary | ICD-10-CM | POA: Diagnosis present

## 2019-09-10 DIAGNOSIS — I63512 Cerebral infarction due to unspecified occlusion or stenosis of left middle cerebral artery: Secondary | ICD-10-CM | POA: Diagnosis present

## 2019-09-10 DIAGNOSIS — F17210 Nicotine dependence, cigarettes, uncomplicated: Secondary | ICD-10-CM | POA: Diagnosis not present

## 2019-09-10 DIAGNOSIS — Z79899 Other long term (current) drug therapy: Secondary | ICD-10-CM | POA: Diagnosis not present

## 2019-09-10 DIAGNOSIS — G8191 Hemiplegia, unspecified affecting right dominant side: Secondary | ICD-10-CM

## 2019-09-10 DIAGNOSIS — K5900 Constipation, unspecified: Secondary | ICD-10-CM | POA: Diagnosis present

## 2019-09-10 DIAGNOSIS — I5042 Chronic combined systolic (congestive) and diastolic (congestive) heart failure: Secondary | ICD-10-CM | POA: Diagnosis present

## 2019-09-10 DIAGNOSIS — I5032 Chronic diastolic (congestive) heart failure: Secondary | ICD-10-CM | POA: Diagnosis present

## 2019-09-10 DIAGNOSIS — R4182 Altered mental status, unspecified: Secondary | ICD-10-CM | POA: Diagnosis not present

## 2019-09-10 DIAGNOSIS — Z888 Allergy status to other drugs, medicaments and biological substances status: Secondary | ICD-10-CM

## 2019-09-10 DIAGNOSIS — R110 Nausea: Secondary | ICD-10-CM

## 2019-09-10 LAB — COMPREHENSIVE METABOLIC PANEL
ALT: 13 U/L (ref 0–44)
AST: 16 U/L (ref 15–41)
Albumin: 3.3 g/dL — ABNORMAL LOW (ref 3.5–5.0)
Alkaline Phosphatase: 54 U/L (ref 38–126)
Anion gap: 12 (ref 5–15)
BUN: 17 mg/dL (ref 6–20)
CO2: 22 mmol/L (ref 22–32)
Calcium: 9.1 mg/dL (ref 8.9–10.3)
Chloride: 98 mmol/L (ref 98–111)
Creatinine, Ser: 0.61 mg/dL (ref 0.44–1.00)
GFR calc Af Amer: >60 mL/min (ref >60)
GFR calc non Af Amer: >60 mL/min (ref >60)
Glucose, Bld: 66 mg/dL — ABNORMAL LOW (ref 70–99)
Potassium: 4.6 mmol/L (ref 3.5–5.1)
Sodium: 132 mmol/L — ABNORMAL LOW (ref 135–145)
Total Bilirubin: 0.9 mg/dL (ref 0.3–1.2)
Total Protein: 6.6 g/dL (ref 6.5–8.1)

## 2019-09-10 LAB — CBC WITH DIFFERENTIAL/PLATELET
Abs Immature Granulocytes: 0.06 10*3/uL (ref 0.00–0.07)
Basophils Absolute: 0 10*3/uL (ref 0.0–0.1)
Basophils Relative: 0 %
Eosinophils Absolute: 0.1 10*3/uL (ref 0.0–0.5)
Eosinophils Relative: 0 %
HCT: 47.2 % — ABNORMAL HIGH (ref 36.0–46.0)
Hemoglobin: 15.4 g/dL — ABNORMAL HIGH (ref 12.0–15.0)
Immature Granulocytes: 0 %
Lymphocytes Relative: 8 %
Lymphs Abs: 1.2 10*3/uL (ref 0.7–4.0)
MCH: 31.9 pg (ref 26.0–34.0)
MCHC: 32.6 g/dL (ref 30.0–36.0)
MCV: 97.7 fL (ref 80.0–100.0)
Monocytes Absolute: 1.3 10*3/uL — ABNORMAL HIGH (ref 0.1–1.0)
Monocytes Relative: 9 %
Neutro Abs: 11.5 10*3/uL — ABNORMAL HIGH (ref 1.7–7.7)
Neutrophils Relative %: 83 %
Platelets: 342 10*3/uL (ref 150–400)
RBC: 4.83 MIL/uL (ref 3.87–5.11)
RDW: 13.2 % (ref 11.5–15.5)
WBC: 14.1 10*3/uL — ABNORMAL HIGH (ref 4.0–10.5)
nRBC: 0 % (ref 0.0–0.2)

## 2019-09-10 LAB — URINALYSIS, ROUTINE W REFLEX MICROSCOPIC
Bacteria, UA: NONE SEEN
Bilirubin Urine: NEGATIVE
Glucose, UA: NEGATIVE mg/dL
Ketones, ur: 80 mg/dL — AB
Leukocytes,Ua: NEGATIVE
Nitrite: NEGATIVE
Protein, ur: NEGATIVE mg/dL
Specific Gravity, Urine: 1.03 (ref 1.005–1.030)
pH: 5 (ref 5.0–8.0)

## 2019-09-10 LAB — LACTIC ACID, PLASMA: Lactic Acid, Venous: 1 mmol/L (ref 0.5–1.9)

## 2019-09-10 LAB — SARS CORONAVIRUS 2 (TAT 6-24 HRS): SARS Coronavirus 2: NEGATIVE

## 2019-09-10 LAB — LIPASE, BLOOD: Lipase: 1653 U/L — ABNORMAL HIGH (ref 11–51)

## 2019-09-10 MED ORDER — RIVAROXABAN 20 MG PO TABS
20.0000 mg | ORAL_TABLET | Freq: Every day | ORAL | Status: DC
  Administered 2019-09-10 – 2019-09-12 (×3): 20 mg via ORAL
  Filled 2019-09-10 (×3): qty 1

## 2019-09-10 MED ORDER — DEXTROSE-NACL 5-0.45 % IV SOLN: INTRAVENOUS | Status: DC

## 2019-09-10 MED ORDER — HYDROXYZINE HCL 10 MG PO TABS
10.0000 mg | ORAL_TABLET | Freq: Every day | ORAL | Status: DC
  Administered 2019-09-10 – 2019-09-12 (×3): 10 mg via ORAL
  Filled 2019-09-10 (×4): qty 1

## 2019-09-10 MED ORDER — FENTANYL CITRATE (PF) 100 MCG/2ML IJ SOLN
50.0000 ug | Freq: Once | INTRAMUSCULAR | Status: AC
  Administered 2019-09-10: 50 ug via INTRAVENOUS
  Filled 2019-09-10: qty 2

## 2019-09-10 MED ORDER — ONDANSETRON HCL 4 MG PO TABS: 4.0000 mg | ORAL_TABLET | Freq: Four times a day (QID) | ORAL | Status: DC | PRN

## 2019-09-10 MED ORDER — SODIUM CHLORIDE 0.9% FLUSH
3.0000 mL | Freq: Two times a day (BID) | INTRAVENOUS | Status: DC
  Administered 2019-09-11: 3 mL via INTRAVENOUS

## 2019-09-10 MED ORDER — SODIUM CHLORIDE 0.9 % IV SOLN: INTRAVENOUS | Status: DC

## 2019-09-10 MED ORDER — ONDANSETRON HCL 4 MG/2ML IJ SOLN
4.0000 mg | Freq: Once | INTRAMUSCULAR | Status: AC
  Administered 2019-09-10: 4 mg via INTRAVENOUS
  Filled 2019-09-10: qty 2

## 2019-09-10 MED ORDER — ACETAMINOPHEN 325 MG PO TABS
650.0000 mg | ORAL_TABLET | Freq: Four times a day (QID) | ORAL | Status: DC | PRN
  Administered 2019-09-12: 650 mg via ORAL
  Filled 2019-09-10: qty 2

## 2019-09-10 MED ORDER — ACETAMINOPHEN 650 MG RE SUPP: 650.0000 mg | Freq: Four times a day (QID) | RECTAL | Status: DC | PRN

## 2019-09-10 MED ORDER — BISACODYL 10 MG RE SUPP
10.0000 mg | Freq: Every day | RECTAL | Status: DC | PRN
  Filled 2019-09-10: qty 1

## 2019-09-10 MED ORDER — SACUBITRIL-VALSARTAN 24-26 MG PO TABS: 1.0000 | ORAL_TABLET | Freq: Two times a day (BID) | ORAL | Status: DC

## 2019-09-10 MED ORDER — ENOXAPARIN SODIUM 40 MG/0.4ML ~~LOC~~ SOLN: 40.0000 mg | SUBCUTANEOUS | Status: DC

## 2019-09-10 MED ORDER — ALBUTEROL SULFATE (2.5 MG/3ML) 0.083% IN NEBU: 2.5000 mg | INHALATION_SOLUTION | RESPIRATORY_TRACT | Status: DC | PRN

## 2019-09-10 MED ORDER — CARVEDILOL 3.125 MG PO TABS: 3.1250 mg | ORAL_TABLET | Freq: Two times a day (BID) | ORAL | Status: DC

## 2019-09-10 MED ORDER — FENTANYL CITRATE (PF) 100 MCG/2ML IJ SOLN
50.0000 ug | Freq: Once | INTRAMUSCULAR | Status: AC
  Administered 2019-09-10: 50 ug via INTRAVENOUS

## 2019-09-10 MED ORDER — HYDROMORPHONE HCL 1 MG/ML IJ SOLN
1.0000 mg | INTRAMUSCULAR | Status: DC | PRN
  Administered 2019-09-10 – 2019-09-11 (×2): 1 mg via INTRAVENOUS
  Filled 2019-09-10 (×2): qty 1

## 2019-09-10 MED ORDER — ONDANSETRON HCL 4 MG/2ML IJ SOLN: 4.0000 mg | Freq: Four times a day (QID) | INTRAMUSCULAR | Status: DC | PRN

## 2019-09-10 MED ORDER — IOHEXOL 300 MG/ML  SOLN
100.0000 mL | Freq: Once | INTRAMUSCULAR | Status: AC | PRN
  Administered 2019-09-10: 100 mL via INTRAVENOUS

## 2019-09-10 MED ORDER — SODIUM CHLORIDE (PF) 0.9 % IJ SOLN
INTRAMUSCULAR | Status: AC
  Filled 2019-09-10: qty 50

## 2019-09-10 NOTE — ED Triage Notes (Signed)
Pt arrives [facility] from PCP office. Pt seen by PCP for abd pain and constipation for 4 days. Pts PCP concerned for ischemic bowel due to abd distension and abd discoloration. Pt has had poor PO intake over the last 4 days.

## 2019-09-10 NOTE — Patient Instructions (Addendum)
Transported to ED concern for acute abdomen on exam today. Concern for intestinal torsion, obstruction and ischemia changes.

## 2019-09-10 NOTE — Progress Notes (Signed)
This visit occurred during the SARS-CoV-2 public health emergency.  Safety protocols were in place, including screening questions prior to the visit, additional usage of staff PPE, and extensive cleaning of exam room while observing appropriate contact time as indicated for disinfecting solutions.    [name] , 1960-10-27, 58 y.o., female MRN: [id number] Patient Care Team    Relationship Specialty Notifications Start End  [name], DO PCP - General Family Medicine  10/13/17   Constance Haw, MD PCP - Electrophysiology Cardiology Admissions 08/28/17   Satira Sark, MD PCP - Cardiology Cardiology Admissions 12/15/17   Rosalin Hawking, MD Consulting Physician Neurology  10/13/17   Jamse Arn, MD Consulting Physician Physical Medicine and Rehabilitation  10/13/17   Luanne Bras, MD Consulting Physician Interventional Radiology  10/13/17     Chief Complaint  Patient presents with  . Constipation    Pt has not had BM x4 days. Pt has not ate anything in 4 days but has been drinking fluids. Pt has weakness. ABD is swollen per patient     Subjective: Pt presents for an OV with complaints of acute abdominal pain since Sunday. She states she was having normal BM up until Saturday- typically once a day. On Sunday she had a very small BM only. Since that time she has not had a BM despite miralax use and Fleets Enema use. She has worsening nausea and has not been eating or drinking in 4 days except 1-2 bottles of water. She is still taking her lasix. She reports she is urinating but not much daily. She is unable to sleep due to pain in her abdomen. She states her pain is epigastric to pubic bone- midline. She has never had colon cancer screen secondary to her desire. No fhx colon cancer or IBD. She is on a blood thinner.  She denies fever, chills or BRBPR. She did notice very minimal blood on toilet tissue after fleets.  She has denies dietary changes or med changes. She  has never had difficulty with BM in the past such as this.  Depression screen [facility] 2/9 11/22/2018 10/22/2018 10/22/2018 10/19/2018 11/10/2017  Decreased Interest 0 0 0 0 0  Down, Depressed, Hopeless 0 0 0 0 0  PHQ - 2 Score 0 0 0 0 0  Altered sleeping - 0 - - 0  Tired, decreased energy - 1 - - 0  Change in appetite - 0 - - 0  Feeling bad or failure about yourself  - 0 - - 0  Trouble concentrating - 0 - - 0  Moving slowly or fidgety/restless - 0 - - 0  Suicidal thoughts - 0 - - 0  PHQ-9 Score - 1 - - 0  Difficult doing work/chores - Not difficult at all - - Not difficult at all  Some recent data might be hidden    Allergies  Allergen Reactions  . Lopressor [Metoprolol Tartrate] Swelling    Angioedema  . Allegra Allergy [Fexofenadine Hcl] Hives  . Nsaids     On Xarelto  . Gabapentin (Once-Daily) Rash  . Lipitor [Atorvastatin] Diarrhea   Social History   Social History Narrative   Married. 2 children.    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    Walks with cane or wheelchair.    Former smoker.    Drinks caffeine.    Smoke alarm in the home.   Firearms in the home.    Wears her seatbelt.    Feels safe  In her relationships.    Past Medical History:  Diagnosis Date  . Angio-edema   . Arthritis   . CAD (coronary artery disease)    a. s/p NSTEMI in 10/2017 with cath showing LM disease --> s/p CABG on 11/23/2017 with LIMA-LAD and SVG-OM.   [name] Hay fever   . Ischemic cardiomyopathy    a. EF 35-40% by echo in 10/2017  . NSTEMI (non-ST elevated myocardial infarction) ([place])   . PAF (paroxysmal atrial fibrillation) ([place])    Diagnosed by ILR  . Pneumonia 11/16/2017  . Stroke [facility]) 08/23/2017   Left MCA infarct status post TPA and thrombectomy  . Urticaria    Past Surgical History:  Procedure Laterality Date  . CORONARY ARTERY BYPASS GRAFT N/A 11/23/2017   Procedure: CORONARY ARTERY BYPASS GRAFTING (CABG) x 2, ON PUMP, USING LEFT INTERNAL MAMMARY ARTERY AND RIGHT GREATER SAPHENOUS VEIN  HARVESTED ENDOSCOPICALLY;  Surgeon: Gaye Pollack, MD;  Location: [place];  Service: Open Heart Surgery;  Laterality: N/A;  . CRYOABLATION     cervical  . IR ANGIO INTRA EXTRACRAN SEL COM CAROTID INNOMINATE UNI R MOD SED  08/21/2017  . IR ANGIO VERTEBRAL SEL SUBCLAVIAN INNOMINATE UNI R MOD SED  08/21/2017  . IR PERCUTANEOUS ART THROMBECTOMY/INFUSION INTRACRANIAL INC DIAG ANGIO  08/21/2017  . IR RADIOLOGIST EVAL & MGMT  10/30/2017  . LOOP RECORDER INSERTION N/A 08/28/2017   Procedure: LOOP RECORDER INSERTION;  Surgeon: Constance Haw, MD;  Location: [place] CV [facility];  Service: Cardiovascular;  Laterality: N/A;  . RADIOLOGY WITH ANESTHESIA N/A 08/21/2017   Procedure: RADIOLOGY WITH ANESTHESIA;  Surgeon: Luanne Bras, MD;  Location: [place];  Service: Radiology;  Laterality: N/A;  . RIGHT/LEFT HEART CATH AND CORONARY ANGIOGRAPHY N/A 11/20/2017   Procedure: RIGHT/LEFT HEART CATH AND CORONARY ANGIOGRAPHY;  Surgeon: Jettie Booze, MD;  Location: [place] CV [facility];  Service: Cardiovascular;  Laterality: N/A;  . TEE WITHOUT CARDIOVERSION N/A 08/28/2017   Procedure: TRANSESOPHAGEAL ECHOCARDIOGRAM (TEE);  Surgeon: Fay Records, MD;  Location: [place];  Service: Cardiovascular;  Laterality: N/A;  . TEE WITHOUT CARDIOVERSION N/A 11/23/2017   Procedure: TRANSESOPHAGEAL ECHOCARDIOGRAM (TEE);  Surgeon: Gaye Pollack, MD;  Location: [place];  Service: Open Heart Surgery;  Laterality: N/A;  . TUBAL LIGATION     Family History  Problem Relation Age of Onset  . Heart attack Father   . Hypertension Mother   . Hyperlipidemia Mother   . Diabetes type II Mother   . Urticaria Mother   . Hypertension Brother   . Breast cancer Cousin 81  . Immunodeficiency Neg Hx   . Eczema Neg Hx   . Atopy Neg Hx   . Asthma Neg Hx   . Angioedema Neg Hx   . Allergic rhinitis Neg Hx    Allergies as of 09/10/2019      Reactions   Lopressor [metoprolol Tartrate] Swelling   Angioedema   Allegra Allergy  [fexofenadine Hcl] Hives   Nsaids    On Xarelto   Gabapentin (once-daily) Rash   Lipitor [atorvastatin] Diarrhea      Medication List       Accurate as of September 10, 2019 11:32 AM. If you have any questions, ask your nurse or doctor.        Azelastine-Fluticasone 137-50 MCG/ACT Susp Place 1 spray into both nostrils 2 (two) times daily as needed.   baclofen 10 MG tablet Commonly known as: LIORESAL TAKE 1 TABLET BY MOUTH THREE TIMES DAILY  calcium carbonate 500 MG chewable tablet Commonly known as: TUMS - dosed in mg elemental calcium Chew 1 tablet by mouth daily.   carvedilol 3.125 MG tablet Commonly known as: COREG Take 1 tablet by mouth twice daily   cholecalciferol 1000 units tablet Commonly known as: VITAMIN D Take 1,000 Units by mouth daily.   Entresto 24-26 MG Generic drug: sacubitril-valsartan Take 1 tablet by mouth 2 (two) times daily.   ferrous sulfate 325 (65 FE) MG tablet Take 325 mg by mouth daily with breakfast. Taking twice weekly   furosemide 20 MG tablet Commonly known as: LASIX Take 0.5 tablets (10 mg total) by mouth daily.   hydrOXYzine 10 MG tablet Commonly known as: ATARAX/VISTARIL Take 1 tablet (10 mg total) by mouth at bedtime.   levocetirizine 5 MG tablet Commonly known as: Xyzal Take 0.5 tablets (2.5 mg total) by mouth every evening.   loratadine 10 MG tablet Commonly known as: CLARITIN Take 1 tablet (10 mg total) by mouth daily.   Potassium Chloride ER 20 MEQ Tbcr Take 1 tablet by mouth once daily   rosuvastatin 40 MG tablet Commonly known as: CRESTOR Take 1 tablet (40 mg total) by mouth daily. 07/18/2019 HOLD Crestor until further notice   triamcinolone cream 0.1 % Commonly known as: KENALOG Apply 1 application topically 2 (two) times daily.   vitamin B-12 100 MCG tablet Commonly known as: CYANOCOBALAMIN Take 100 mcg by mouth daily.   Xarelto 20 MG Tabs tablet Generic drug: rivaroxaban TAKE 1 TABLET BY MOUTH ONCE  DAILY WITH SUPPER       All past medical history, surgical history, allergies, family history, immunizations andmedications were updated in the EMR today and reviewed under the history and medication portions of their EMR.     ROS: Negative, with the exception of above mentioned in HPI   Objective:  BP (!) 83/56 (BP Location: Left Arm, Patient Position: Sitting, Cuff Size: Normal)   Pulse (!) 108   Temp 98.1 F (36.7 C) (Temporal)   Resp 18   Ht 5\' 1"  (1.549 m)   Wt 122 lb 4 oz (55.5 kg)   LMP  (LMP Unknown)   SpO2 93%   BMI 23.10 kg/m  Body mass index is 23.1 kg/m. Gen: Afebrile. No acute distress. Nontoxic in appearance, well developed, well nourished. Uncomfortable.  HENT: AT. [place].  Eyes:Pupils Equal Round Reactive to light, Extraocular movements intact,  Conjunctiva without redness, discharge or icterus. CV: tachycardic.  Chest: CTAB, no wheeze or crackles. Good air movement, normal resp effort.  Abd: taut. Distended. Mild skin changes epigastric region. Severe TTP umbilicus up to sternum. Diffuse TTP and pain with position change. BS not present upper quadrants, hypo active lower quadrant.  Severe tenderness hinders exam. rebound or guarding present. Skin: mild splotchy skin changes.  Neuro:  PERLA. EOMi. Alert. Oriented x3   No exam data present No results found. No results found for this or any previous visit (from the past 24 hour(s)).  Assessment/Plan: REMEDI STANKUS is a 58 y.o. female present for OV for  Acute abdominal pain/Obstipation/Bloating/Distended abdomen/Nausea/dehydration Pt presents with acute abd today. Distention, pain and obstipation 4 d with no flatus 2 days. Severe pain. Not tolerating fluids. Skin changes concerning for ischemic changes.  VSS- mild tachycardia. Dehydrated.  Pt has continued lasix last 4 days despite not tolerating PO. EMS called for transport to ED for emergent labs, IVF, image and evaluation. Husband informed.       Reviewed expectations re: course  of current medical issues.  Discussed self-management of symptoms.  Outlined signs and symptoms indicating need for more acute intervention.  Patient verbalized understanding and all questions were answered.  Patient received an After-Visit Summary.    No orders of the defined types were placed in this encounter.  Greater than 40 minutes spent with patient, >50% of time spent face to face       Note is dictated utilizing voice recognition software. Although note has been proof read prior to signing, occasional typographical errors still can be missed. If any questions arise, please do not hesitate to call for verification.   electronically signed by:  Howard Pouch, DO  [place]

## 2019-09-10 NOTE — Progress Notes (Signed)
ED TO INPATIENT HANDOFF REPORT  ED Nurse Name and Phone #: Ansel Ferrall [phone]   Name/Age/Gender [name] 58 y.o. female Room/Bed: WA29/WA29  Code Status   Code Status: Full Code  Home/SNF/Other Home Patient oriented to: self Is this baseline? Yes   Triage Complete: Triage complete  Chief Complaint Acute pancreatitis [K85.90]  Triage Note Pt arrives [facility] from PCP office. Pt seen by PCP for abd pain and constipation for 4 days. Pts PCP concerned for ischemic bowel due to abd distension and abd discoloration. Pt has had poor PO intake over the last 4 days.     Allergies Allergies  Allergen Reactions  . Lopressor [Metoprolol Tartrate] Swelling    Angioedema  . Allegra Allergy [Fexofenadine Hcl] Hives  . Nsaids     On Xarelto  . Gabapentin (Once-Daily) Rash  . Lipitor [Atorvastatin] Diarrhea    Level of Care/Admitting Diagnosis ED Disposition    ED Disposition Condition Comment   Admit  Hospital Area: [facility] [[id number]]  Level of Care: Med-Surg [16]  Covid Evaluation: Asymptomatic Screening Protocol (No Symptoms)  Diagnosis: Acute pancreatitis [577.0.ICD-9-CM]  Admitting Physician: Alma Friendly [id number]  Attending Physician: Alma Friendly [id number]  Estimated length of stay: past midnight tomorrow  Certification:: I certify this patient will need inpatient services for at least 2 midnights       B Medical/Surgery History Past Medical History:  Diagnosis Date  . Angio-edema   . Arthritis   . CAD (coronary artery disease)    a. s/p NSTEMI in 10/2017 with cath showing LM disease --> s/p CABG on 11/23/2017 with LIMA-LAD and SVG-OM.   [name] Hay fever   . Ischemic cardiomyopathy    a. EF 35-40% by echo in 10/2017  . NSTEMI (non-ST elevated myocardial infarction) ([place])   . PAF (paroxysmal atrial fibrillation) ([place])    Diagnosed by ILR  . Pneumonia 11/16/2017  . Stroke [facility]) 08/23/2017   Left MCA infarct status post TPA  and thrombectomy  . Urticaria    Past Surgical History:  Procedure Laterality Date  . CORONARY ARTERY BYPASS GRAFT N/A 11/23/2017   Procedure: CORONARY ARTERY BYPASS GRAFTING (CABG) x 2, ON PUMP, USING LEFT INTERNAL MAMMARY ARTERY AND RIGHT GREATER SAPHENOUS VEIN HARVESTED ENDOSCOPICALLY;  Surgeon: Gaye Pollack, MD;  Location: [place];  Service: Open Heart Surgery;  Laterality: N/A;  . CRYOABLATION     cervical  . IR ANGIO INTRA EXTRACRAN SEL COM CAROTID INNOMINATE UNI R MOD SED  08/21/2017  . IR ANGIO VERTEBRAL SEL SUBCLAVIAN INNOMINATE UNI R MOD SED  08/21/2017  . IR PERCUTANEOUS ART THROMBECTOMY/INFUSION INTRACRANIAL INC DIAG ANGIO  08/21/2017  . IR RADIOLOGIST EVAL & MGMT  10/30/2017  . LOOP RECORDER INSERTION N/A 08/28/2017   Procedure: LOOP RECORDER INSERTION;  Surgeon: Constance Haw, MD;  Location: [place] CV [facility];  Service: Cardiovascular;  Laterality: N/A;  . RADIOLOGY WITH ANESTHESIA N/A 08/21/2017   Procedure: RADIOLOGY WITH ANESTHESIA;  Surgeon: Luanne Bras, MD;  Location: [place];  Service: Radiology;  Laterality: N/A;  . RIGHT/LEFT HEART CATH AND CORONARY ANGIOGRAPHY N/A 11/20/2017   Procedure: RIGHT/LEFT HEART CATH AND CORONARY ANGIOGRAPHY;  Surgeon: Jettie Booze, MD;  Location: [place] [facility];  Service: Cardiovascular;  Laterality: N/A;  . TEE WITHOUT CARDIOVERSION N/A 08/28/2017   Procedure: TRANSESOPHAGEAL ECHOCARDIOGRAM (TEE);  Surgeon: Fay Records, MD;  Location: [place];  Service: Cardiovascular;  Laterality: N/A;  . TEE WITHOUT CARDIOVERSION N/A 11/23/2017   Procedure:  TRANSESOPHAGEAL ECHOCARDIOGRAM (TEE);  Surgeon: Gaye Pollack, MD;  Location: [place];  Service: Open Heart Surgery;  Laterality: N/A;  . TUBAL LIGATION       A IV Location/Drains/Wounds Patient Lines/Drains/Airways Status   Active Line/Drains/Airways    Name:   Placement date:   Placement time:   Site:   Days:   Peripheral IV 09/10/19 Left Antecubital   09/10/19    --     Antecubital   less than 1   Incision (Closed) 11/23/17 Sternum Other (Comment)   11/23/17    0845     656   Incision (Closed) 11/23/17 Leg Right   11/23/17    0845     656   Wound / Incision (Open or Dehisced) 08/29/17 Incision - Open Groin Anterior;Left small incision site   08/29/17    0955    Groin   742          Intake/Output Last 24 hours No intake or output data in the 24 hours ending 09/10/19 2246  Labs/Imaging Results for orders placed or performed during the hospital encounter of 09/10/19 (from the past 48 hour(s))  Urinalysis, Routine w reflex microscopic     Status: Abnormal   Collection Time: 09/10/19  1:02 PM  Result Value Ref Range   Color, Urine YELLOW YELLOW   APPearance CLEAR CLEAR   Specific Gravity, Urine 1.030 1.005 - 1.030   pH 5.0 5.0 - 8.0   Glucose, UA NEGATIVE NEGATIVE mg/dL   Hgb urine dipstick SMALL (A) NEGATIVE   Bilirubin Urine NEGATIVE NEGATIVE   Ketones, ur 80 (A) NEGATIVE mg/dL   Protein, ur NEGATIVE NEGATIVE mg/dL   Nitrite NEGATIVE NEGATIVE   Leukocytes,Ua NEGATIVE NEGATIVE   RBC / HPF 0-5 0 - 5 RBC/hpf   WBC, UA 0-5 0 - 5 WBC/hpf   Bacteria, UA NONE SEEN NONE SEEN   Squamous Epithelial / LPF 0-5 0 - 5   Mucus PRESENT     Comment: Performed at [facility], [place] [address], [place], [place] [address]  Comprehensive metabolic panel     Status: Abnormal   Collection Time: 09/10/19  1:12 PM  Result Value Ref Range   Sodium 132 (L) 135 - 145 mmol/L   Potassium 4.6 3.5 - 5.1 mmol/L   Chloride 98 98 - 111 mmol/L   CO2 22 22 - 32 mmol/L   Glucose, Bld 66 (L) 70 - 99 mg/dL   BUN 17 6 - 20 mg/dL   Creatinine, Ser 0.61 0.44 - 1.00 mg/dL   Calcium 9.1 8.9 - 10.3 mg/dL   Total Protein 6.6 6.5 - 8.1 g/dL   Albumin 3.3 (L) 3.5 - 5.0 g/dL   AST 16 15 - 41 U/L   ALT 13 0 - 44 U/L   Alkaline Phosphatase 54 38 - 126 U/L   Total Bilirubin 0.9 0.3 - 1.2 mg/dL   GFR calc non Af Amer >60 >60 mL/min   GFR calc Af Amer >60 >60 mL/min    Anion gap 12 5 - 15    Comment: Performed at [facility], [place] [address] [place], [place] [address]  Lipase, blood     Status: Abnormal   Collection Time: 09/10/19  1:12 PM  Result Value Ref Range   Lipase 1,653 (H) 11 - 51 U/L    Comment: RESULTS CONFIRMED BY MANUAL DILUTION Performed at [facility], [place] [address] [place], [place] [address]   CBC WITH DIFFERENTIAL  Status: Abnormal   Collection Time: 09/10/19  1:12 PM  Result Value Ref Range   WBC 14.1 (H) 4.0 - 10.5 K/uL   RBC 4.83 3.87 - 5.11 MIL/uL   Hemoglobin 15.4 (H) 12.0 - 15.0 g/dL   HCT 47.2 (H) 36.0 - 46.0 %   MCV 97.7 80.0 - 100.0 fL   MCH 31.9 26.0 - 34.0 pg   MCHC 32.6 30.0 - 36.0 g/dL   RDW 13.2 11.5 - 15.5 %   Platelets 342 150 - 400 K/uL   nRBC 0.0 0.0 - 0.2 %   Neutrophils Relative % 83 %   Neutro Abs 11.5 (H) 1.7 - 7.7 K/uL   Lymphocytes Relative 8 %   Lymphs Abs 1.2 0.7 - 4.0 K/uL   Monocytes Relative 9 %   Monocytes Absolute 1.3 (H) 0.1 - 1.0 K/uL   Eosinophils Relative 0 %   Eosinophils Absolute 0.1 0.0 - 0.5 K/uL   Basophils Relative 0 %   Basophils Absolute 0.0 0.0 - 0.1 K/uL   Immature Granulocytes 0 %   Abs Immature Granulocytes 0.06 0.00 - 0.07 K/uL    Comment: Performed at [facility], [place] [address], [place], [place] [address]  Lactic acid, plasma     Status: None   Collection Time: 09/10/19  1:12 PM  Result Value Ref Range   Lactic Acid, Venous 1.0 0.5 - 1.9 mmol/L    Comment: Performed at [facility], [place] [address], [place], [place] [address]   CT ABDOMEN PELVIS W CONTRAST  Result Date: 09/10/2019 CLINICAL DATA:  Left abdominal pain. Concern for ischemic bowel or bowel obstruction. EXAM: CT ABDOMEN AND PELVIS WITH CONTRAST TECHNIQUE: Multidetector CT imaging of the abdomen and pelvis was performed using the standard protocol following bolus administration of intravenous contrast. CONTRAST:  166mL OMNIPAQUE  IOHEXOL 300 MG/ML  SOLN COMPARISON:  None. FINDINGS: Lower chest: No acute abnormality. Hepatobiliary: No focal hepatic abnormality. Gallbladder unremarkable. Pancreas: Inflammation surrounding the pancreatic body and tail concerning for acute pancreatitis. 2.2 cm cystic lesion within the pancreatic head. No ductal dilatation. Spleen: No focal abnormality.  Normal size. Adrenals/Urinary Tract: 4 mm proximal right ureteral stone. No hydronephrosis. No renal or adrenal mass. Small cysts within the left kidney. Urinary bladder unremarkable. Stomach/Bowel: Scattered left colonic diverticulosis. No active diverticulitis. Normal appendix. Stomach and small bowel decompressed, unremarkable. Vascular/Lymphatic: Aneurysmal dilatation of the infrarenal aorta measuring up to 3.1 cm. Extensive mural plaque within the aneurysm with areas of ulceration within the plaque. Diffuse aortoiliac atherosclerosis. No adenopathy. Reproductive: Uterus and adnexa unremarkable.  No mass. Other: No free fluid or free air. Musculoskeletal: No acute bony abnormality. IMPRESSION: Stranding/inflammation around the pancreatic body and tail compatible with acute pancreatitis. 2.2 cm cystic lesion within the pancreatic head could reflect pseudo cyst or other pancreatic cyst. This could be followed with repeat CT in 6 months after resolution of pancreatitis symptoms or further characterized with elective pancreatic MRI following resolution of pancreatitis. 3.1 cm infrarenal abdominal aortic aneurysm with ulcerative plaque. 4 mm proximal right ureteral stone.  No hydronephrosis. Electronically Signed   By: Rolm Baptise M.D.   On: 09/10/2019 16:10    Pending Labs Unresulted Labs (From admission, onward)    Start     Ordered   09/11/19 0500  Comprehensive metabolic panel  Tomorrow morning,   R     09/10/19 1945   09/11/19 0500  CBC  Tomorrow morning,   R     09/10/19 1945  09/11/19 0500  Lipase, blood  Daily,   R     09/10/19 1720    09/11/19 0500  Lipid panel  Tomorrow morning,   R     09/10/19 1721   09/10/19 1946  HIV Antibody (routine testing w rflx)  (HIV Antibody (Routine testing w reflex) panel)  Once,   STAT     09/10/19 1945   09/10/19 1616  SARS CORONAVIRUS 2 (TAT 6-24 HRS) Nasopharyngeal Nasopharyngeal Swab  (Tier 3 (TAT 6-24 hrs))  Once,   STAT    Question Answer Comment  Is this test for diagnosis or screening Screening   Symptomatic for COVID-19 as defined by CDC No   Hospitalized for COVID-19 No   Admitted to ICU for COVID-19 No   Previously tested for COVID-19 No   Resident in a congregate (group) care setting No   Employed in healthcare setting No   Pregnant No      09/10/19 1615          Vitals/Pain Today's Vitals   09/10/19 1742 09/10/19 2140 09/10/19 2158 09/10/19 2237  BP: 119/85  91/65   Pulse: (!) 107  (!) 115   Resp: 19  20   Temp:   98.5 F (36.9 C)   TempSrc:   Oral   SpO2: 95%  96%   Weight:      Height:      PainSc:  8   Asleep    Isolation Precautions No active isolations  Medications Medications  sodium chloride (PF) 0.9 % injection (has no administration in time range)  HYDROmorphone (DILAUDID) injection 1 mg (1 mg Intravenous Given 09/10/19 2140)  dextrose 5 %-0.45 % sodium chloride infusion ( Intravenous New Bag/Given 09/10/19 1841)  sodium chloride flush (NS) 0.9 % injection 3 mL (has no administration in time range)  acetaminophen (TYLENOL) tablet 650 mg (has no administration in time range)    Or  acetaminophen (TYLENOL) suppository 650 mg (has no administration in time range)  bisacodyl (DULCOLAX) suppository 10 mg (has no administration in time range)  ondansetron (ZOFRAN) tablet 4 mg (has no administration in time range)    Or  ondansetron (ZOFRAN) injection 4 mg (has no administration in time range)  albuterol (PROVENTIL) (2.5 MG/3ML) 0.083% nebulizer solution 2.5 mg (has no administration in time range)  rivaroxaban (XARELTO) tablet 20 mg (20 mg Oral  Given 09/10/19 2141)  hydrOXYzine (ATARAX/VISTARIL) tablet 10 mg (10 mg Oral Given 09/10/19 2141)  fentaNYL (SUBLIMAZE) injection 50 mcg (50 mcg Intravenous Given 09/10/19 1324)  ondansetron (ZOFRAN) injection 4 mg (4 mg Intravenous Given 09/10/19 1324)  iohexol (OMNIPAQUE) 300 MG/ML solution 100 mL (100 mLs Intravenous Contrast Given 09/10/19 1512)  fentaNYL (SUBLIMAZE) injection 50 mcg (50 mcg Intravenous Given 09/10/19 1608)    Mobility walks Low fall risk   Focused Assessments    Recommendations: See Admitting Provider Note  Report given to:   Additional Notes:

## 2019-09-10 NOTE — ED Notes (Signed)
Pt requested to eat and drink. Asked attending and she said to keep pt NPO for now.

## 2019-09-10 NOTE — H&P (Signed)
History and Physical  [name] [id number] DOB: [birth year] DOA: 09/10/2019   Patient coming from: Home & is able to ambulate  Chief Complaint: Abdominal pain  HPI: [name] is a 58 y.o. female with medical history significant for CAD, ischemic cardiomyopathy, paroxysmal A. fib, CVA presents to the ED, complaining of generalized abdominal pain, worse around the epigastric region radiating to the back, poor oral intake and abdominal distention that has been ongoing for the past 4 days.  Patient describes the pain as sharp worse in the epigastric region radiating to the back, due to the pain unable to tolerate orally, no significant BM for the past 4 days as well.  Due to the pain, patient went to her PCP clinic who subsequently referred patient to the ED for further evaluation.  Patient denies any chest pain, shortness of breath, fever/chills.  Patient denies any alcohol intake, no history of gallstones in the past.  ED Course: Vital signs stable except for some tachycardia, BP soft, labs showed elevated lipase of 1653, leukocytosis, lactic acid unremarkable, hyponatremia.  CT abdomen pelvis showed acute pancreatitis, 2.2 cm cystic lesion within the pancreatic head, could reflect pseudocysts or the pancreatic cyst.  Patient was started on IV fluids, pain management.  Patient admitted for further management  Review of Systems: Review of systems are otherwise negative   Past Medical History:  Diagnosis Date  . Angio-edema   . Arthritis   . CAD (coronary artery disease)    a. s/p NSTEMI in 10/2017 with cath showing LM disease --> s/p CABG on 11/23/2017 with LIMA-LAD and SVG-OM.   [name] Hay fever   . Ischemic cardiomyopathy    a. EF 35-40% by echo in 10/2017  . NSTEMI (non-ST elevated myocardial infarction) ([place])   . PAF (paroxysmal atrial fibrillation) ([place])    Diagnosed by ILR  . Pneumonia 11/16/2017  . Stroke [facility]) 08/23/2017   Left MCA infarct status post TPA and  thrombectomy  . Urticaria    Past Surgical History:  Procedure Laterality Date  . CORONARY ARTERY BYPASS GRAFT N/A 11/23/2017   Procedure: CORONARY ARTERY BYPASS GRAFTING (CABG) x 2, ON PUMP, USING LEFT INTERNAL MAMMARY ARTERY AND RIGHT GREATER SAPHENOUS VEIN HARVESTED ENDOSCOPICALLY;  Surgeon: Gaye Pollack, MD;  Location: [place];  Service: Open Heart Surgery;  Laterality: N/A;  . CRYOABLATION     cervical  . IR ANGIO INTRA EXTRACRAN SEL COM CAROTID INNOMINATE UNI R MOD SED  08/21/2017  . IR ANGIO VERTEBRAL SEL SUBCLAVIAN INNOMINATE UNI R MOD SED  08/21/2017  . IR PERCUTANEOUS ART THROMBECTOMY/INFUSION INTRACRANIAL INC DIAG ANGIO  08/21/2017  . IR RADIOLOGIST EVAL & MGMT  10/30/2017  . LOOP RECORDER INSERTION N/A 08/28/2017   Procedure: LOOP RECORDER INSERTION;  Surgeon: Constance Haw, MD;  Location: [place] CV [facility];  Service: Cardiovascular;  Laterality: N/A;  . RADIOLOGY WITH ANESTHESIA N/A 08/21/2017   Procedure: RADIOLOGY WITH ANESTHESIA;  Surgeon: Luanne Bras, MD;  Location: [place];  Service: Radiology;  Laterality: N/A;  . RIGHT/LEFT HEART CATH AND CORONARY ANGIOGRAPHY N/A 11/20/2017   Procedure: RIGHT/LEFT HEART CATH AND CORONARY ANGIOGRAPHY;  Surgeon: Jettie Booze, MD;  Location: [place] [facility];  Service: Cardiovascular;  Laterality: N/A;  . TEE WITHOUT CARDIOVERSION N/A 08/28/2017   Procedure: TRANSESOPHAGEAL ECHOCARDIOGRAM (TEE);  Surgeon: Fay Records, MD;  Location: [place];  Service: Cardiovascular;  Laterality: N/A;  . TEE WITHOUT CARDIOVERSION N/A 11/23/2017   Procedure: TRANSESOPHAGEAL ECHOCARDIOGRAM (TEE);  Surgeon: Cyndia Bent,  Fernande Boyden, MD;  Location: [place];  Service: Open Heart Surgery;  Laterality: N/A;  . TUBAL LIGATION      Social History:  reports that she quit smoking about 13 months ago. Her smoking use included cigarettes. She has a 3.70 pack-year smoking history. She has never used smokeless tobacco. She reports that she does not drink  alcohol or use drugs.   Allergies  Allergen Reactions  . Lopressor [Metoprolol Tartrate] Swelling    Angioedema  . Allegra Allergy [Fexofenadine Hcl] Hives  . Nsaids     On Xarelto  . Gabapentin (Once-Daily) Rash  . Lipitor [Atorvastatin] Diarrhea    Family History  Problem Relation Age of Onset  . Heart attack Father   . Hypertension Mother   . Hyperlipidemia Mother   . Diabetes type II Mother   . Urticaria Mother   . Hypertension Brother   . Breast cancer Cousin 75  . Immunodeficiency Neg Hx   . Eczema Neg Hx   . Atopy Neg Hx   . Asthma Neg Hx   . Angioedema Neg Hx   . Allergic rhinitis Neg Hx       Prior to Admission medications   Medication Sig Start Date End Date Taking? Authorizing Provider  Azelastine-Fluticasone 137-50 MCG/ACT SUSP Place 1 spray into both nostrils 2 (two) times daily as needed. 07/09/19  Yes Bobbitt, Sedalia Muta, MD  baclofen (LIORESAL) 10 MG tablet TAKE 1 TABLET BY MOUTH THREE TIMES DAILY Patient taking differently: Take 10 mg by mouth 3 (three) times daily.  07/09/19  Yes Jamse Arn, MD  calcium carbonate (TUMS - DOSED IN MG ELEMENTAL CALCIUM) 500 MG chewable tablet Chew 1 tablet by mouth 2 (two) times daily.    Yes [provider]  carvedilol (COREG) 3.125 MG tablet Take 1 tablet by mouth twice daily 09/02/19  Yes Satira Sark, MD  cholecalciferol (VITAMIN D) 1000 units tablet Take 1,000 Units by mouth daily.   Yes [provider]  ferrous sulfate 325 (65 FE) MG tablet Take 325 mg by mouth daily with breakfast. Taking twice weekly   Yes [provider]  furosemide (LASIX) 20 MG tablet Take 0.5 tablets (10 mg total) by mouth daily. 10/22/18  Yes [name], [name], DO  hydrOXYzine (ATARAX/VISTARIL) 10 MG tablet Take 1 tablet (10 mg total) by mouth at bedtime. 07/01/19  Yes [name], [name], DO  levocetirizine (XYZAL) 5 MG tablet Take 0.5 tablets (2.5 mg total) by mouth every evening. 07/16/19  Yes [name], [name],  DO  loratadine (CLARITIN) 10 MG tablet Take 1 tablet (10 mg total) by mouth daily. 06/25/19  Yes Ann Held, DO  Potassium Chloride ER 20 MEQ TBCR Take 1 tablet by mouth once daily 08/27/19  Yes [name], [name], DO  sacubitril-valsartan (ENTRESTO) 24-26 MG Take 1 tablet by mouth 2 (two) times daily. 08/14/19  Yes Satira Sark, MD  triamcinolone cream (KENALOG) 0.1 % Apply 1 application topically 2 (two) times daily. Patient taking differently: Apply 1 application topically 2 (two) times daily as needed (rash).  04/18/18  Yes [name], [name], DO  vitamin B-12 (CYANOCOBALAMIN) 100 MCG tablet Take 100 mcg by mouth daily.   Yes [provider]  XARELTO 20 MG TABS tablet TAKE 1 TABLET BY MOUTH ONCE DAILY WITH SUPPER Patient taking differently: Take 20 mg by mouth daily with supper. Crushed Xarelto can be given down a G-tube but NOT a J-Tube. 04/01/19  Yes Camnitz, Will Triplett,  MD  rosuvastatin (CRESTOR) 40 MG tablet Take 1 tablet (40 mg total) by mouth daily. 07/18/2019 HOLD Crestor until further notice Patient not taking: Reported on 09/10/2019 07/18/19   Satira Sark, MD    Physical Exam: BP 91/65 (BP Location: Left Arm) Comment (BP Location): Lt. wrist  Pulse (!) 115   Temp 98.5 F (36.9 C) (Oral)   Resp 20   Ht 5\' 1"  (1.549 m)   Wt 55.3 kg   LMP  (LMP Unknown)   SpO2 96%   BMI 23.05 kg/m   General: NAD Eyes: Normal ENT: Normal Neck: Supple Cardiovascular: S1, S2 present Respiratory: CTA B Abdomen: Soft, generalized tenderness, nondistended, bowel sounds present Skin: Normal Musculoskeletal: No pedal edema bilaterally Psychiatric: Normal mood Neurologic: No obvious focal neurologic deficit noted except for right upper extremity chronic residual weakness          Labs on Admission:  Basic Metabolic Panel: Recent Labs  Lab 09/10/19 1312  NA 132*  K 4.6  CL 98  CO2 22  GLUCOSE 66*  BUN 17  CREATININE 0.61  CALCIUM 9.1   Liver Function Tests:  Recent Labs  Lab 09/10/19 1312  AST 16  ALT 13  ALKPHOS 54  BILITOT 0.9  PROT 6.6  ALBUMIN 3.3*   Recent Labs  Lab 09/10/19 1312  LIPASE 1,653*   No results for input(s): AMMONIA in the last 168 hours. CBC: Recent Labs  Lab 09/10/19 1312  WBC 14.1*  NEUTROABS 11.5*  HGB 15.4*  HCT 47.2*  MCV 97.7  PLT 342   Cardiac Enzymes: No results for input(s): CKTOTAL, CKMB, CKMBINDEX, TROPONINI in the last 168 hours.  BNP (last 3 results) No results for input(s): BNP in the last 8760 hours.  ProBNP (last 3 results) No results for input(s): PROBNP in the last 8760 hours.  CBG: No results for input(s): GLUCAP in the last 168 hours.  Radiological Exams on Admission: CT ABDOMEN PELVIS W CONTRAST  Result Date: 09/10/2019 CLINICAL DATA:  Left abdominal pain. Concern for ischemic bowel or bowel obstruction. EXAM: CT ABDOMEN AND PELVIS WITH CONTRAST TECHNIQUE: Multidetector CT imaging of the abdomen and pelvis was performed using the standard protocol following bolus administration of intravenous contrast. CONTRAST:  161mL OMNIPAQUE IOHEXOL 300 MG/ML  SOLN COMPARISON:  None. FINDINGS: Lower chest: No acute abnormality. Hepatobiliary: No focal hepatic abnormality. Gallbladder unremarkable. Pancreas: Inflammation surrounding the pancreatic body and tail concerning for acute pancreatitis. 2.2 cm cystic lesion within the pancreatic head. No ductal dilatation. Spleen: No focal abnormality.  Normal size. Adrenals/Urinary Tract: 4 mm proximal right ureteral stone. No hydronephrosis. No renal or adrenal mass. Small cysts within the left kidney. Urinary bladder unremarkable. Stomach/Bowel: Scattered left colonic diverticulosis. No active diverticulitis. Normal appendix. Stomach and small bowel decompressed, unremarkable. Vascular/Lymphatic: Aneurysmal dilatation of the infrarenal aorta measuring up to 3.1 cm. Extensive mural plaque within the aneurysm with areas of ulceration within the plaque.  Diffuse aortoiliac atherosclerosis. No adenopathy. Reproductive: Uterus and adnexa unremarkable.  No mass. Other: No free fluid or free air. Musculoskeletal: No acute bony abnormality. IMPRESSION: Stranding/inflammation around the pancreatic body and tail compatible with acute pancreatitis. 2.2 cm cystic lesion within the pancreatic head could reflect pseudo cyst or other pancreatic cyst. This could be followed with repeat CT in 6 months after resolution of pancreatitis symptoms or further characterized with elective pancreatic MRI following resolution of pancreatitis. 3.1 cm infrarenal abdominal aortic aneurysm with ulcerative plaque. 4 mm proximal right ureteral stone.  No hydronephrosis.  Electronically Signed   By: Rolm Baptise M.D.   On: 09/10/2019 16:10    EKG: Pending  Assessment/Plan Present on Admission: . Acute pancreatitis . Cerebrovascular accident (CVA) due to occlusion of left middle cerebral artery ([place]) . NSTEMI (non-ST elevated myocardial infarction) ([place]) . Chronic combined systolic and diastolic CHF (congestive heart failure) ([place]) . Paroxysmal atrial fibrillation ([facility])  Principal Problem:   Acute pancreatitis Active Problems:   Cerebrovascular accident (CVA) due to occlusion of left middle cerebral artery (H[facility])   Right hemiplegia ([facility])   NSTEMI (non-ST elevated myocardial infarction) ([place])   S/P CABG x 2   Chronic combined systolic and diastolic CHF (congestive heart failure) (H[facility])   Paroxysmal atrial fibrillation ([facility])   Acute pancreatitis ??Likely idiopathic, unknown etiology, ??Triglyceride No gallstones, denies any alcohol intake, no recent trauma Lipase elevated, will trend Lipid panel in a.m. CT abdomen pelvis showed acute pancreatitis with possible 2.2 cm in head of pancrease IV fluids, pain management, n.p.o. status Monitor closely  Leukocytosis Likely reactive, afebrile Repeat CBC in a.m.  Chronic combined systolic and diastolic HF/ischemic  cardiomyopathy Appears dry Echo done 07/2019 showed EF of 50 to 55% with grade 1 diastolic dysfunction Hold home Lasix, Coreg, Entresto for now due to soft BP Continue IV fluids for now  CAD/history of CABG Currently chest pain-free  Paroxysmal A. Fib Heart rate controlled Continue Xarelto  History of CVA Chronic right upper extremity residual weakness Continue Xarelto        DVT prophylaxis: Xarelto   Code Status: Full   Family Communication: None at bedside  Disposition Plan: Likely home  Consults called: None  Admission status: Inpatient    Alma Friendly MD [facility]   If 7PM-7AM, please contact night-coverage [email or web address]   09/10/2019, 10:45 PM

## 2019-09-10 NOTE — ED Provider Notes (Signed)
[facility] DEPT Provider Note   CSN: [place]:[id number] Arrival date & time: 09/10/19  1254     History Chief Complaint  Patient presents with  . Abdominal Pain    Jakhiya SHELA SAYARATH is a 58 y.o. female.  HPI  Patient presents with abdominal pain. Onset was about 5 days ago. Since that time she has had worsening diffuse pain, possibly worse on the left but in general it is throughout. There is associated anorexia, nausea, but no vomiting. No bowel movements in at least 2 or 3 days. Patient has history of open heart surgery, tubal ligation, but no other abdominal procedures. Today, with worsening pain she went to her physician's office and was sent here for evaluation. She denies fever, chest pain, cough, dyspnea. She has been intolerant of medication for relief.     Past Medical History:  Diagnosis Date  . Angio-edema   . Arthritis   . CAD (coronary artery disease)    a. s/p NSTEMI in 10/2017 with cath showing LM disease --> s/p CABG on 11/23/2017 with LIMA-LAD and SVG-OM.   [name] Hay fever   . Ischemic cardiomyopathy    a. EF 35-40% by echo in 10/2017  . NSTEMI (non-ST elevated myocardial infarction) ([place])   . PAF (paroxysmal atrial fibrillation) ([place])    Diagnosed by ILR  . Pneumonia 11/16/2017  . Stroke [facility]) 08/23/2017   Left MCA infarct status post TPA and thrombectomy  . Urticaria     Patient Active Problem List   Diagnosis Date Noted  . Acute pancreatitis 09/10/2019  . Abnormality of gait 08/27/2019  . Chronic rhinitis 07/09/2019  . Allergic reaction 07/09/2019  . 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 Carpal tunnel syndrome of left wrist 03/15/2019  . Anxiety 10/24/2018  . Numbness of left hand 10/19/2018  . Spastic hemiparesis affecting dominant side ([place]) 09/20/2018  . Primary osteoarthritis of both knees 07/04/2018  . Iron deficiency 04/23/2018  . Chronic urticaria 03/20/2018  . Atherosclerosis of aorta ([place]) 02/02/2018  .  Osteoarthritis of left knee 01/16/2018  . Osteoarthritis of right knee 01/16/2018  . Sequela of cerebrovascular accident 01/16/2018  . Paroxysmal atrial fibrillation ([place]) 12/19/2017  . Chronic combined systolic and diastolic CHF (congestive heart failure) ([place]) 12/16/2017  . Benign essential HTN   . Supplemental oxygen dependent   . 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 Coronary artery disease 11/23/2017  . NSTEMI (non-ST elevated myocardial infarction) ([place]) 11/16/2017  . Recurrent urticaria 10/13/2017  . Former smoker 10/13/2017  . Angioedema   . 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 Cerebrovascular accident (CVA) due to occlusion of left middle cerebral artery ([place])   . Right hemiplegia ([place])   . Dysphagia, post-stroke   . Hyperlipidemia LDL goal <70     Past Surgical History:  Procedure Laterality Date  . CORONARY ARTERY BYPASS GRAFT N/A 11/23/2017   Procedure: CORONARY ARTERY BYPASS GRAFTING (CABG) x 2, ON PUMP, USING LEFT INTERNAL MAMMARY ARTERY AND RIGHT GREATER SAPHENOUS VEIN HARVESTED ENDOSCOPICALLY;  Surgeon: Gaye Pollack, MD;  Location: [facility];  Service: Open Heart Surgery;  Laterality: N/A;  . CRYOABLATION     cervical  . IR ANGIO INTRA EXTRACRAN SEL COM CAROTID INNOMINATE UNI R MOD SED  08/21/2017  . IR ANGIO VERTEBRAL SEL SUBCLAVIAN INNOMINATE UNI R MOD SED  08/21/2017  . IR PERCUTANEOUS ART THROMBECTOMY/INFUSION INTRACRANIAL INC DIAG ANGIO  08/21/2017  . IR RADIOLOGIST EVAL & MGMT  10/30/2017  . LOOP RECORDER  INSERTION N/A 08/28/2017   Procedure: LOOP RECORDER INSERTION;  Surgeon: Constance Haw, MD;  Location: [facility];  Service: Cardiovascular;  Laterality: N/A;  . RADIOLOGY WITH ANESTHESIA N/A 08/21/2017   Procedure: RADIOLOGY WITH ANESTHESIA;  Surgeon: Luanne Bras, MD;  Location: [facility];  Service: Radiology;  Laterality: N/A;  . RIGHT/LEFT HEART CATH AND CORONARY ANGIOGRAPHY N/A 11/20/2017   Procedure: RIGHT/LEFT HEART CATH AND CORONARY  ANGIOGRAPHY;  Surgeon: Jettie Booze, MD;  Location: [facility];  Service: Cardiovascular;  Laterality: N/A;  . TEE WITHOUT CARDIOVERSION N/A 08/28/2017   Procedure: TRANSESOPHAGEAL ECHOCARDIOGRAM (TEE);  Surgeon: Fay Records, MD;  Location: [facility];  Service: Cardiovascular;  Laterality: N/A;  . TEE WITHOUT CARDIOVERSION N/A 11/23/2017   Procedure: TRANSESOPHAGEAL ECHOCARDIOGRAM (TEE);  Surgeon: Gaye Pollack, MD;  Location: [facility];  Service: Open Heart Surgery;  Laterality: N/A;  . TUBAL LIGATION       OB History    Gravida  2   Para  2   Term      Preterm      AB      Living  2     SAB      TAB      Ectopic      Multiple      Live Births              Family History  Problem Relation Age of Onset  . Heart attack Father   . Hypertension Mother   . Hyperlipidemia Mother   . Diabetes type II Mother   . Urticaria Mother   . Hypertension Brother   . Breast cancer Cousin 57  . Immunodeficiency Neg Hx   . Eczema Neg Hx   . Atopy Neg Hx   . Asthma Neg Hx   . Angioedema Neg Hx   . Allergic rhinitis Neg Hx     Social History   Tobacco Use  . Smoking status: Former Smoker    Packs/day: 0.10    Years: 37.00    Pack years: 3.70    Types: Cigarettes    Quit date: 07/17/2018    Years since quitting: 1.1  . Smokeless tobacco: Never Used  Substance Use Topics  . Alcohol use: No  . Drug use: No    Home Medications Prior to Admission medications   Medication Sig Start Date End Date Taking? Authorizing Provider  Azelastine-Fluticasone 137-50 MCG/ACT SUSP Place 1 spray into both nostrils 2 (two) times daily as needed. 07/09/19  Yes Bobbitt, [place] Muta, MD  baclofen (LIORESAL) 10 MG tablet TAKE 1 TABLET BY MOUTH THREE TIMES DAILY Patient taking differently: Take 10 mg by mouth 3 (three) times daily.  07/09/19  Yes Jamse Arn, MD  calcium carbonate (TUMS - DOSED IN MG ELEMENTAL CALCIUM) 500 MG chewable tablet Chew 1 tablet by mouth  2 (two) times daily.    Yes [provider]  carvedilol (COREG) 3.125 MG tablet Take 1 tablet by mouth twice daily 09/02/19  Yes Satira Sark, MD  cholecalciferol (VITAMIN D) 1000 units tablet Take 1,000 Units by mouth daily.   Yes [provider]  ferrous sulfate 325 (65 FE) MG tablet Take 325 mg by mouth daily with breakfast. Taking twice weekly   Yes [provider]  furosemide (LASIX) 20 MG tablet Take 0.5 tablets (10 mg total) by mouth daily. 10/22/18  Yes [name], [name], DO  hydrOXYzine (ATARAX/VISTARIL) 10 MG tablet Take 1 tablet (10  mg total) by mouth at bedtime. 07/01/19  Yes [name], [name], DO  levocetirizine (XYZAL) 5 MG tablet Take 0.5 tablets (2.5 mg total) by mouth every evening. 07/16/19  Yes [name], [name], DO  loratadine (CLARITIN) 10 MG tablet Take 1 tablet (10 mg total) by mouth daily. 06/25/19  Yes Ann Held, DO  Potassium Chloride ER 20 MEQ TBCR Take 1 tablet by mouth once daily 08/27/19  Yes [name], [name], DO  sacubitril-valsartan (ENTRESTO) 24-26 MG Take 1 tablet by mouth 2 (two) times daily. 08/14/19  Yes Satira Sark, MD  triamcinolone cream (KENALOG) 0.1 % Apply 1 application topically 2 (two) times daily. Patient taking differently: Apply 1 application topically 2 (two) times daily as needed (rash).  04/18/18  Yes [name], [name], DO  vitamin B-12 (CYANOCOBALAMIN) 100 MCG tablet Take 100 mcg by mouth daily.   Yes [provider]  XARELTO 20 MG TABS tablet TAKE 1 TABLET BY MOUTH ONCE DAILY WITH SUPPER Patient taking differently: Take 20 mg by mouth daily with supper. Crushed Xarelto can be given down a G-tube but NOT a J-Tube. 04/01/19  Yes Camnitz, Will Hassell Done, MD  rosuvastatin (CRESTOR) 40 MG tablet Take 1 tablet (40 mg total) by mouth daily. 07/18/2019 HOLD Crestor until further notice Patient not taking: Reported on 09/10/2019 07/18/19   Satira Sark, MD    Allergies    Lopressor [metoprolol tartrate],  Allegra allergy [fexofenadine hcl], Nsaids, Gabapentin (once-daily), and Lipitor [atorvastatin]  Review of Systems   Review of Systems  Constitutional:       Per HPI, otherwise negative  HENT:       Per HPI, otherwise negative  Respiratory:       Per HPI, otherwise negative  Cardiovascular:       Per HPI, otherwise negative  Gastrointestinal: Positive for abdominal pain and nausea. Negative for vomiting.  Endocrine:       Negative aside from HPI  Genitourinary:       Neg aside from HPI   Musculoskeletal:       Per HPI, otherwise negative  Skin: Negative.   Neurological: Negative for syncope.    Physical Exam Updated Vital Signs BP 107/71   Pulse (!) 106   Temp 97.7 F (36.5 C) (Oral)   Resp 18   Ht 5\' 1"  (1.549 m)   Wt 55.3 kg   LMP  (LMP Unknown)   SpO2 94%   BMI 23.05 kg/m   Physical Exam Vitals and nursing note reviewed.  Constitutional:      General: She is not in acute distress.    Appearance: She is well-developed.  HENT:     Head: Normocephalic and atraumatic.  Eyes:     Conjunctiva/sclera: Conjunctivae normal.  Cardiovascular:     Rate and Rhythm: Normal rate and regular rhythm.  Pulmonary:     Effort: Pulmonary effort is normal. No respiratory distress.     Breath sounds: Normal breath sounds. No stridor.  Abdominal:     General: There is no distension.     Tenderness: There is generalized abdominal tenderness. There is guarding.  Skin:    General: Skin is warm and dry.  Neurological:     Mental Status: She is alert and oriented to person, place, and time.     Cranial Nerves: No cranial nerve deficit.     ED Results / Procedures / Treatments   Labs (all labs ordered are listed, but only abnormal results are displayed) Labs  Reviewed  COMPREHENSIVE METABOLIC PANEL - Abnormal; Notable for the following components:      Result Value   Sodium 132 (*)    Glucose, Bld 66 (*)    Albumin 3.3 (*)    All other components within normal limits    LIPASE, BLOOD - Abnormal; Notable for the following components:   Lipase 1,653 (*)    All other components within normal limits  CBC WITH DIFFERENTIAL/PLATELET - Abnormal; Notable for the following components:   WBC 14.1 (*)    Hemoglobin 15.4 (*)    HCT 47.2 (*)    Neutro Abs 11.5 (*)    Monocytes Absolute 1.3 (*)    All other components within normal limits  URINALYSIS, ROUTINE W REFLEX MICROSCOPIC - Abnormal; Notable for the following components:   Hgb urine dipstick SMALL (*)    Ketones, ur 80 (*)    All other components within normal limits  SARS CORONAVIRUS 2 (TAT 6-24 HRS)  LACTIC ACID, PLASMA    EKG None  Radiology CT ABDOMEN PELVIS W CONTRAST  Result Date: 09/10/2019 CLINICAL DATA:  Left abdominal pain. Concern for ischemic bowel or bowel obstruction. EXAM: CT ABDOMEN AND PELVIS WITH CONTRAST TECHNIQUE: Multidetector CT imaging of the abdomen and pelvis was performed using the standard protocol following bolus administration of intravenous contrast. CONTRAST:  135mL OMNIPAQUE IOHEXOL 300 MG/ML  SOLN COMPARISON:  None. FINDINGS: Lower chest: No acute abnormality. Hepatobiliary: No focal hepatic abnormality. Gallbladder unremarkable. Pancreas: Inflammation surrounding the pancreatic body and tail concerning for acute pancreatitis. 2.2 cm cystic lesion within the pancreatic head. No ductal dilatation. Spleen: No focal abnormality.  Normal size. Adrenals/Urinary Tract: 4 mm proximal right ureteral stone. No hydronephrosis. No renal or adrenal mass. Small cysts within the left kidney. Urinary bladder unremarkable. Stomach/Bowel: Scattered left colonic diverticulosis. No active diverticulitis. Normal appendix. Stomach and small bowel decompressed, unremarkable. Vascular/Lymphatic: Aneurysmal dilatation of the infrarenal aorta measuring up to 3.1 cm. Extensive mural plaque within the aneurysm with areas of ulceration within the plaque. Diffuse aortoiliac atherosclerosis. No adenopathy.  Reproductive: Uterus and adnexa unremarkable.  No mass. Other: No free fluid or free air. Musculoskeletal: No acute bony abnormality. IMPRESSION: Stranding/inflammation around the pancreatic body and tail compatible with acute pancreatitis. 2.2 cm cystic lesion within the pancreatic head could reflect pseudo cyst or other pancreatic cyst. This could be followed with repeat CT in 6 months after resolution of pancreatitis symptoms or further characterized with elective pancreatic MRI following resolution of pancreatitis. 3.1 cm infrarenal abdominal aortic aneurysm with ulcerative plaque. 4 mm proximal right ureteral stone.  No hydronephrosis. Electronically Signed   By: Rolm Baptise M.D.   On: 09/10/2019 16:10    Procedures Procedures (including critical care time)  Medications Ordered in ED Medications  0.9 %  sodium chloride infusion ( Intravenous Stopped 09/10/19 1653)  sodium chloride (PF) 0.9 % injection (has no administration in time range)  0.9 %  sodium chloride infusion ( Intravenous New Bag/Given (Non-Interop) 09/10/19 1653)  HYDROmorphone (DILAUDID) injection 1 mg (has no administration in time range)  fentaNYL (SUBLIMAZE) injection 50 mcg (50 mcg Intravenous Given 09/10/19 1324)  ondansetron (ZOFRAN) injection 4 mg (4 mg Intravenous Given 09/10/19 1324)  iohexol (OMNIPAQUE) 300 MG/ML solution 100 mL (100 mLs Intravenous Contrast Given 09/10/19 1512)  fentaNYL (SUBLIMAZE) injection 50 mcg (50 mcg Intravenous Given 09/10/19 1608)    ED Course  I have reviewed the triage vital signs and the nursing notes.  Pertinent labs & imaging results that  were available during my care of the patient were reviewed by me and considered in my medical decision making (see chart for details).    MDM Rules/Calculators/A&P                      5:07 PM Patient slightly more comfortable in appearance.  Line patient's labs notable for lipase greater than 1600.  CT also consistent with acute  pancreatitis, with notation of aneurysm as well. Given these findings, patient will require admission for further monitoring, management.  Final Clinical Impression(s) / ED Diagnoses Final diagnoses:  Idiopathic acute pancreatitis without infection or necrosis     Carmin Muskrat, MD 09/10/19 1708

## 2019-09-11 ENCOUNTER — Encounter: Payer: Self-pay | Admitting: Family Medicine

## 2019-09-11 DIAGNOSIS — K862 Cyst of pancreas: Secondary | ICD-10-CM

## 2019-09-11 DIAGNOSIS — I714 Abdominal aortic aneurysm, without rupture, unspecified: Secondary | ICD-10-CM | POA: Insufficient documentation

## 2019-09-11 DIAGNOSIS — K8500 Idiopathic acute pancreatitis without necrosis or infection: Secondary | ICD-10-CM

## 2019-09-11 DIAGNOSIS — I5042 Chronic combined systolic (congestive) and diastolic (congestive) heart failure: Secondary | ICD-10-CM

## 2019-09-11 LAB — COMPREHENSIVE METABOLIC PANEL
ALT: 13 U/L (ref 0–44)
AST: 17 U/L (ref 15–41)
Albumin: 2.8 g/dL — ABNORMAL LOW (ref 3.5–5.0)
Alkaline Phosphatase: 49 U/L (ref 38–126)
Anion gap: 7 (ref 5–15)
BUN: 15 mg/dL (ref 6–20)
CO2: 23 mmol/L (ref 22–32)
Calcium: 8.5 mg/dL — ABNORMAL LOW (ref 8.9–10.3)
Chloride: 103 mmol/L (ref 98–111)
Creatinine, Ser: 0.65 mg/dL (ref 0.44–1.00)
GFR calc Af Amer: >60 mL/min (ref >60)
GFR calc non Af Amer: >60 mL/min (ref >60)
Glucose, Bld: 116 mg/dL — ABNORMAL HIGH (ref 70–99)
Potassium: 4.1 mmol/L (ref 3.5–5.1)
Sodium: 133 mmol/L — ABNORMAL LOW (ref 135–145)
Total Bilirubin: 0.3 mg/dL (ref 0.3–1.2)
Total Protein: 5.8 g/dL — ABNORMAL LOW (ref 6.5–8.1)

## 2019-09-11 LAB — CBC
HCT: 41.9 % (ref 36.0–46.0)
Hemoglobin: 13.6 g/dL (ref 12.0–15.0)
MCH: 31.8 pg (ref 26.0–34.0)
MCHC: 32.5 g/dL (ref 30.0–36.0)
MCV: 97.9 fL (ref 80.0–100.0)
Platelets: 286 10*3/uL (ref 150–400)
RBC: 4.28 MIL/uL (ref 3.87–5.11)
RDW: 13.2 % (ref 11.5–15.5)
WBC: 11.1 10*3/uL — ABNORMAL HIGH (ref 4.0–10.5)
nRBC: 0 % (ref 0.0–0.2)

## 2019-09-11 LAB — HIV ANTIBODY (ROUTINE TESTING W REFLEX): HIV Screen 4th Generation wRfx: NONREACTIVE

## 2019-09-11 LAB — CORTISOL: Cortisol, Plasma: 8.6 ug/dL

## 2019-09-11 LAB — LIPASE, BLOOD: Lipase: 306 U/L — ABNORMAL HIGH (ref 11–51)

## 2019-09-11 LAB — LIPID PANEL
Cholesterol: 203 mg/dL — ABNORMAL HIGH (ref 0–200)
HDL: 28 mg/dL — ABNORMAL LOW (ref >40)
LDL Cholesterol: 158 mg/dL — ABNORMAL HIGH (ref 0–99)
Total CHOL/HDL Ratio: 7.3 RATIO
Triglycerides: 85 mg/dL (ref <150)
VLDL: 17 mg/dL (ref 0–40)

## 2019-09-11 LAB — GLUCOSE, CAPILLARY: Glucose-Capillary: 108 mg/dL — ABNORMAL HIGH (ref 70–99)

## 2019-09-11 MED ORDER — SODIUM CHLORIDE 0.9 % IV BOLUS
1000.0000 mL | Freq: Once | INTRAVENOUS | Status: AC
  Administered 2019-09-11: 1000 mL via INTRAVENOUS

## 2019-09-11 MED ORDER — LACTATED RINGERS IV SOLN: INTRAVENOUS | Status: DC

## 2019-09-11 MED ORDER — HYDROMORPHONE HCL 1 MG/ML IJ SOLN
0.5000 mg | INTRAMUSCULAR | Status: DC | PRN
  Administered 2019-09-11 – 2019-09-12 (×3): 0.5 mg via INTRAVENOUS
  Filled 2019-09-11 (×3): qty 0.5

## 2019-09-11 NOTE — Consult Note (Addendum)
Consultation  Referring Provider:  Dr. Tana Coast    Primary Care Physician:  [name], DO Primary Gastroenterologist:   Althia Forts      Reason for Consultation: Acute pancreatitis             HPI:   [name] is a 58 y.o. female with a past medical history significant for CAD, ischemic cardiomyopathy, paroxysmal A. fib on Xarelto, CVA and others, who presented to the ER on 09/10/2019 complaining of generalized abdominal pain worse in the epigastric region radiating through to her back with decreased intake and abdominal distention over the past 4 days.    Today, the patient explains that 5 to 6 days ago she started with an epigastric pain that spread around her entire abdomen, she thought this was related to the fact that she could not have a bowel movement.  Typically she has a small bowel movement every morning but she cannot have one at all, tried a fleets enema and stool softener over the following days which she did not result in anything.  She slowly had a decrease in appetite and in fact has not had anything to eat over the past 72 hours.  Patient did go to see her PCP and was referred to the ED.      Currently she has started to pass gas which is a change for her and is having a lot of eructations.  Her pain level is down from a 10/10 to a 5-6/10 this morning.  Patient denies any previous episodes of the same.  Denies NSAID use, alcohol use or family history of pancreatitis.    Also complains of some indigestion, apparently takes 2 Tums on a daily basis, that her symptoms have increased since being in the hospital.  Patient describes that she is hungry but no she can eat anything.    Denies fever, chills, weight loss or symptoms that awaken her from sleep.  ED course: Tachycardia, soft BP, labs showed elevated lipase of 1653, leukocytosis, hyponatremia.  CT abdomen pelvis showed acute pancreatitis, 2.2 cm cystic lesion within the pancreatic head, thought to reflect possible  pseudocysts or pancreatic cyst  Past Medical History:  Diagnosis Date   Angio-edema    Arthritis    CAD (coronary artery disease)    a. s/p NSTEMI in 10/2017 with cath showing LM disease --> s/p CABG on 11/23/2017 with LIMA-LAD and SVG-OM.    Hay fever    Ischemic cardiomyopathy    a. EF 35-40% by echo in 10/2017   NSTEMI (non-ST elevated myocardial infarction) ([place])    PAF (paroxysmal atrial fibrillation) ([place])    Diagnosed by ILR   Pneumonia 11/16/2017   Stroke ([place]) 08/23/2017   Left MCA infarct status post TPA and thrombectomy   Urticaria     Past Surgical History:  Procedure Laterality Date   CORONARY ARTERY BYPASS GRAFT N/A 11/23/2017   Procedure: CORONARY ARTERY BYPASS GRAFTING (CABG) x 2, ON PUMP, USING LEFT INTERNAL MAMMARY ARTERY AND RIGHT GREATER SAPHENOUS VEIN HARVESTED ENDOSCOPICALLY;  Surgeon: Gaye Pollack, MD;  Location: [facility];  Service: Open Heart Surgery;  Laterality: N/A;   CRYOABLATION     cervical   IR ANGIO INTRA EXTRACRAN SEL COM CAROTID INNOMINATE UNI R MOD SED  08/21/2017   IR ANGIO VERTEBRAL SEL SUBCLAVIAN INNOMINATE UNI R MOD SED  08/21/2017   IR PERCUTANEOUS ART THROMBECTOMY/INFUSION INTRACRANIAL INC DIAG ANGIO  08/21/2017   IR RADIOLOGIST EVAL & MGMT  10/30/2017  LOOP RECORDER INSERTION N/A 08/28/2017   Procedure: LOOP RECORDER INSERTION;  Surgeon: Constance Haw, MD;  Location: [facility];  Service: Cardiovascular;  Laterality: N/A;   RADIOLOGY WITH ANESTHESIA N/A 08/21/2017   Procedure: RADIOLOGY WITH ANESTHESIA;  Surgeon: Luanne Bras, MD;  Location: [facility];  Service: Radiology;  Laterality: N/A;   RIGHT/LEFT HEART CATH AND CORONARY ANGIOGRAPHY N/A 11/20/2017   Procedure: RIGHT/LEFT HEART CATH AND CORONARY ANGIOGRAPHY;  Surgeon: Jettie Booze, MD;  Location: [facility];  Service: Cardiovascular;  Laterality: N/A;   TEE WITHOUT CARDIOVERSION N/A 08/28/2017   Procedure: TRANSESOPHAGEAL ECHOCARDIOGRAM  (TEE);  Surgeon: Fay Records, MD;  Location: [place] [facility];  Service: Cardiovascular;  Laterality: N/A;   TEE WITHOUT CARDIOVERSION N/A 11/23/2017   Procedure: TRANSESOPHAGEAL ECHOCARDIOGRAM (TEE);  Surgeon: Gaye Pollack, MD;  Location: [facility];  Service: Open Heart Surgery;  Laterality: N/A;   TUBAL LIGATION      Family History  Problem Relation Age of Onset   Heart attack Father    Hypertension Mother    Hyperlipidemia Mother    Diabetes type II Mother    Urticaria Mother    Hypertension Brother    Breast cancer Cousin 13   Immunodeficiency Neg Hx    Eczema Neg Hx    Atopy Neg Hx    Asthma Neg Hx    Angioedema Neg Hx    Allergic rhinitis Neg Hx      Social History   Tobacco Use   Smoking status: Former Smoker    Packs/day: 0.10    Years: 37.00    Pack years: 3.70    Types: Cigarettes    Quit date: 07/17/2018    Years since quitting: 1.1   Smokeless tobacco: Never Used  Substance Use Topics   Alcohol use: No   Drug use: No    Prior to Admission medications   Medication Sig Start Date End Date Taking? Authorizing Provider  Azelastine-Fluticasone 137-50 MCG/ACT SUSP Place 1 spray into both nostrils 2 (two) times daily as needed. 07/09/19  Yes Bobbitt, [place] Muta, MD  baclofen (LIORESAL) 10 MG tablet TAKE 1 TABLET BY MOUTH THREE TIMES DAILY Patient taking differently: Take 10 mg by mouth 3 (three) times daily.  07/09/19  Yes Jamse Arn, MD  calcium carbonate (TUMS - DOSED IN MG ELEMENTAL CALCIUM) 500 MG chewable tablet Chew 1 tablet by mouth 2 (two) times daily.    Yes [provider]  carvedilol (COREG) 3.125 MG tablet Take 1 tablet by mouth twice daily 09/02/19  Yes Satira Sark, MD  cholecalciferol (VITAMIN D) 1000 units tablet Take 1,000 Units by mouth daily.   Yes [provider]  ferrous sulfate 325 (65 FE) MG tablet Take 325 mg by mouth daily with breakfast. Taking twice weekly   Yes [provider]    furosemide (LASIX) 20 MG tablet Take 0.5 tablets (10 mg total) by mouth daily. 10/22/18  Yes [name], [name], DO  hydrOXYzine (ATARAX/VISTARIL) 10 MG tablet Take 1 tablet (10 mg total) by mouth at bedtime. 07/01/19  Yes [name], [name], DO  levocetirizine (XYZAL) 5 MG tablet Take 0.5 tablets (2.5 mg total) by mouth every evening. 07/16/19  Yes [name], [name], DO  loratadine (CLARITIN) 10 MG tablet Take 1 tablet (10 mg total) by mouth daily. 06/25/19  Yes Ann Held, DO  Potassium Chloride ER 20 MEQ TBCR Take 1 tablet by mouth once daily 08/27/19  Yes [name], Renee  A, DO  sacubitril-valsartan (ENTRESTO) 24-26 MG Take 1 tablet by mouth 2 (two) times daily. 08/14/19  Yes Satira Sark, MD  triamcinolone cream (KENALOG) 0.1 % Apply 1 application topically 2 (two) times daily. Patient taking differently: Apply 1 application topically 2 (two) times daily as needed (rash).  04/18/18  Yes [name], [name], DO  vitamin B-12 (CYANOCOBALAMIN) 100 MCG tablet Take 100 mcg by mouth daily.   Yes [provider]  XARELTO 20 MG TABS tablet TAKE 1 TABLET BY MOUTH ONCE DAILY WITH SUPPER Patient taking differently: Take 20 mg by mouth daily with supper. Crushed Xarelto can be given down a G-tube but NOT a J-Tube. 04/01/19  Yes Camnitz, Will Hassell Done, MD  rosuvastatin (CRESTOR) 40 MG tablet Take 1 tablet (40 mg total) by mouth daily. 07/18/2019 HOLD Crestor until further notice Patient not taking: Reported on 09/10/2019 07/18/19   Satira Sark, MD    Current Facility-Administered Medications  Medication Dose Route Frequency Provider Last Rate Last Admin   acetaminophen (TYLENOL) tablet 650 mg  650 mg Oral Q6H PRN Alma Friendly, MD       Or   acetaminophen (TYLENOL) suppository 650 mg  650 mg Rectal Q6H PRN Alma Friendly, MD       albuterol (PROVENTIL) (2.5 MG/3ML) 0.083% nebulizer solution 2.5 mg  2.5 mg Nebulization Q2H PRN Alma Friendly, MD       bisacodyl  (DULCOLAX) suppository 10 mg  10 mg Rectal Daily PRN Alma Friendly, MD       dextrose 5 %-0.45 % sodium chloride infusion   Intravenous Continuous Alma Friendly, MD 125 mL/hr at 09/11/19 0249 New Bag at 09/11/19 0249   HYDROmorphone (DILAUDID) injection 1 mg  1 mg Intravenous Q3H PRN Carmin Muskrat, MD   1 mg at 09/10/19 2140   hydrOXYzine (ATARAX/VISTARIL) tablet 10 mg  10 mg Oral QHS Alma Friendly, MD   10 mg at 09/10/19 2141   ondansetron (ZOFRAN) tablet 4 mg  4 mg Oral Q6H PRN Alma Friendly, MD       Or   ondansetron [facility]) injection 4 mg  4 mg Intravenous Q6H PRN Alma Friendly, MD       rivaroxaban Alveda Reasons) tablet 20 mg  20 mg Oral Q supper Alma Friendly, MD   20 mg at 09/10/19 2141   sodium chloride flush (NS) 0.9 % injection 3 mL  3 mL Intravenous Q12H Alma Friendly, MD        Allergies as of 09/10/2019 - Review Complete 09/10/2019  Allergen Reaction Noted   Lopressor [metoprolol tartrate] Swelling 09/05/2017   Allegra allergy [fexofenadine hcl] Hives 07/16/2019   Nsaids  10/24/2018   Gabapentin (once-daily) Rash 07/01/2019   Lipitor [atorvastatin] Diarrhea 10/13/2017     Review of Systems:    Constitutional: No weight loss, fever or chills Skin: No rash  Cardiovascular: No chest pain Respiratory: No SOB  Gastrointestinal: See HPI and otherwise negative Genitourinary: No dysuria Neurological: No headache Musculoskeletal: No new muscle or joint pain Hematologic: No bleeding  Psychiatric: No history of depression or anxiety    Physical Exam:  Vital signs in last 24 hours: Temp:  [97.7 F (36.5 C)-98.5 F (36.9 C)] 98.2 F (36.8 C) (12/16 0504) Pulse Rate:  [90-115] 90 (12/16 0504) Resp:  [15-28] 16 (12/16 0504) BP: (83-119)/(56-85) 96/66 (12/16 0504) SpO2:  [90 %-97 %] 93 % (12/16 0504) Weight:  [55.3 kg-56.6 kg] 56.6 kg (12/16 0500)  Last BM Date: 09/04/19 General:   Pleasant Caucasian female appears  to be in NAD, Well developed, Well nourished, alert and cooperative Head:  Normocephalic and atraumatic. Eyes:   PEERL, EOMI. No icterus. Conjunctiva pink. Ears:  Normal auditory acuity. Neck:  Supple Throat: Oral cavity and pharynx without inflammation, swelling or lesion.  Lungs: Respirations even and unlabored. Lungs clear to auscultation bilaterally.   No wheezes, crackles, or rhonchi.  Heart: Normal S1, S2. No MRG. Regular rate and rhythm. No peripheral edema, cyanosis or pallor.  Abdomen:  Soft, marked epigastric ttp, mild distension in this area with involuntary guarding. Normal bowel sounds. No appreciable masses or hepatomegaly. Rectal:  Not performed.  Msk:  Symmetrical without gross deformities. Peripheral pulses intact.  Extremities:  Without edema, no deformity or joint abnormality.  Neurologic:  Alert and  oriented x4;  grossly normal neurologically.  Skin:   Dry and intact without significant lesions or rashes. Psychiatric: Demonstrates good judgement and reason without abnormal affect or behaviors.   LAB RESULTS: Recent Labs    09/10/19 1312 09/11/19 0453  WBC 14.1* 11.1*  HGB 15.4* 13.6  HCT 47.2* 41.9  PLT 342 286   BMET Recent Labs    09/10/19 1312 09/11/19 0453  NA 132* 133*  K 4.6 4.1  CL 98 103  CO2 22 23  GLUCOSE 66* 116*  BUN 17 15  CREATININE 0.61 0.65  CALCIUM 9.1 8.5*   LFT Recent Labs    09/11/19 0453  PROT 5.8*  ALBUMIN 2.8*  AST 17  ALT 13  ALKPHOS 49  BILITOT 0.3   Lipase     Component Value Date/Time   LIPASE 306 (H) 09/11/2019 0453   STUDIES: CT ABDOMEN PELVIS W CONTRAST  Result Date: 09/10/2019 CLINICAL DATA:  Left abdominal pain. Concern for ischemic bowel or bowel obstruction. EXAM: CT ABDOMEN AND PELVIS WITH CONTRAST TECHNIQUE: Multidetector CT imaging of the abdomen and pelvis was performed using the standard protocol following bolus administration of intravenous contrast. CONTRAST:  180mL OMNIPAQUE IOHEXOL 300 MG/ML   SOLN COMPARISON:  None. FINDINGS: Lower chest: No acute abnormality. Hepatobiliary: No focal hepatic abnormality. Gallbladder unremarkable. Pancreas: Inflammation surrounding the pancreatic body and tail concerning for acute pancreatitis. 2.2 cm cystic lesion within the pancreatic head. No ductal dilatation. Spleen: No focal abnormality.  Normal size. Adrenals/Urinary Tract: 4 mm proximal right ureteral stone. No hydronephrosis. No renal or adrenal mass. Small cysts within the left kidney. Urinary bladder unremarkable. Stomach/Bowel: Scattered left colonic diverticulosis. No active diverticulitis. Normal appendix. Stomach and small bowel decompressed, unremarkable. Vascular/Lymphatic: Aneurysmal dilatation of the infrarenal aorta measuring up to 3.1 cm. Extensive mural plaque within the aneurysm with areas of ulceration within the plaque. Diffuse aortoiliac atherosclerosis. No adenopathy. Reproductive: Uterus and adnexa unremarkable.  No mass. Other: No free fluid or free air. Musculoskeletal: No acute bony abnormality. IMPRESSION: Stranding/inflammation around the pancreatic body and tail compatible with acute pancreatitis. 2.2 cm cystic lesion within the pancreatic head could reflect pseudo cyst or other pancreatic cyst. This could be followed with repeat CT in 6 months after resolution of pancreatitis symptoms or further characterized with elective pancreatic MRI following resolution of pancreatitis. 3.1 cm infrarenal abdominal aortic aneurysm with ulcerative plaque. 4 mm proximal right ureteral stone.  No hydronephrosis. Electronically Signed   By: Rolm Baptise M.D.   On: 09/10/2019 16:10     Impression / Plan:   Impression: 1.  Acute pancreatitis: Lipase 1653 at admission with CT showing inflammation around the pancreatic  body and tail compatible with acute pancreatitis, lipase down to 306 this morning, triglycerides normal, CT with no gallstones, but 2.2 cm cystic lesion within the pancreatic head as  above, currently improving on fluids and pain control 2.  Leukocytosis: Likely with above 3.  Chronic combined systolic and diastolic HF/ischemic cardiomyopathy 4.  CAD/history of CABG 5.  Paroxysmal A. fib: On Xarelto 6.  History of CVA 7.  Constipation: Patient now passing gas which is an improvement; possible ileus from pancreatitis above  Plan: 1.  Continue fluids, changed to Lactated Ringers at 250 ml/hr,  and pain control 2.  Agree with n.p.o. for now, could trial clear liquids later today pending on patient's symptoms 3.  Will discuss above with Dr. Bryan Lemma.  Please await further recommendations from him later today.  Thank you for your kind consultation, we will continue to follow.  Lavone Nian [facility]  09/11/2019, 9:56 AM

## 2019-09-11 NOTE — Progress Notes (Signed)
[facility]                                                                              Patient Demographics  [name], is a 58 y.o. female, DOB - 1961-01-16, QMV:[id number]  Admit date - 09/10/2019   Admitting Physician Briant Cedar, MD  Outpatient Primary MD for the patient is Natalia Leatherwood, DO  Outpatient specialists:   LOS - 1  days   Medical records reviewed and are as summarized below:    Chief Complaint  Patient presents with  . Abdominal Pain       Brief summary    [name] is a 58 y.o. female with medical history significant for CAD, ischemic cardiomyopathy, paroxysmal A. fib, CVA presents to the ED, complaining of generalized abdominal pain, worse around the epigastric region radiating to the back, poor oral intake and abdominal distention that has been ongoing for the past 4 days.  Patient describes the pain as sharp worse in the epigastric region radiating to the back, due to the pain unable to tolerate orally, no significant BM for the past 4 days as well.  Due to the pain, patient went to her PCP clinic who subsequently referred patient to the ED for further evaluation.  Patient denies any chest pain, shortness of breath, fever/chills.  Patient denies any alcohol intake, no history of gallstones in the past. In ED, WBCs 14.1, hypertension BP 83/56, tachycardia, lipase 1653 patient was admitted for further work-up  Assessment & Plan    Principal Problem:   Acute pancreatitis with sepsis -Patient met sepsis criteria at the time of admission due to leukocytosis, tachycardia, hypotension likely due to acute pancreatitis -Unclear etiology, patient reports not drinking alcohol, triglycerides 85, no gallstones seen on the CT -CT abdomen showed acute pancreatitis with possible 2.2 cm cyst versus pseudocyst in the head of the pancreas -Continue IV fluids, pain control, n.p.o. status -GI consulted, will follow further conditions  regarding ultrasound versus MRCP or HIDA - LFTs normal -Leukocytosis improving  Active Problems:   Cerebrovascular accident (CVA) due to occlusion of left middle cerebral artery (HCC) -Chronic right upper extremity residual weakness, continue Xarelto   CAD with history of NSTEMI, CABG x2 -Currently no acute chest pain or shortness of breath.  Hold Lasix, Coreg, Entresto due to borderline BP    Chronic combined systolic and diastolic CHF (congestive heart failure) (HCC) -Currently stable, continue IV fluids -Hold home Lasix, Coreg, Entresto due to borderline BP    Paroxysmal atrial fibrillation (HCC) Heart rate currently controlled, continue Xarelto  Code Status: Full CODE STATUS DVT Prophylaxis: Xarelto Family Communication: Discussed all imaging results, lab results, explained to the patient    Disposition Plan: Awaiting GI evaluation  Time Spent in minutes   35 minutes  Procedures:  CT abdomen  Consultants:   GI  Antimicrobials:   Anti-infectives (From admission, onward)   None          Medications  Scheduled Meds: . hydrOXYzine  10 mg Oral QHS  . rivaroxaban  20 mg Oral Q supper  . sodium chloride flush  3 mL Intravenous Q12H  Continuous Infusions: . lactated ringers 250 mL/hr at 09/11/19 1335   PRN Meds:.acetaminophen **OR** acetaminophen, albuterol, bisacodyl, HYDROmorphone (DILAUDID) injection, ondansetron **OR** ondansetron (ZOFRAN) IV      Subjective:   [name] was seen and examined today.  No acute complaints except feeling bloated.  No dizziness or lightheadedness, nausea or vomiting.  No fevers, no acute events overnight.  Objective:   Vitals:   09/10/19 2307 09/11/19 0500 09/11/19 0504 09/11/19 1336  BP: 96/63  96/66 (!) 100/59  Pulse: 99  90 (!) 109  Resp: 16  16 16   Temp: 98.3 F (36.8 C)  98.2 F (36.8 C) 98 F (36.7 C)  TempSrc: Oral  Oral Oral  SpO2: 90%  93% 95%  Weight:  56.6 kg    Height:         Intake/Output Summary (Last 24 hours) at 09/11/2019 1404 Last data filed at 09/11/2019 1000 Gross per 24 hour  Intake 1885.9 ml  Output 0 ml  Net 1885.9 ml     Wt Readings from Last 3 Encounters:  09/11/19 56.6 kg  09/10/19 55.5 kg  08/27/19 56.2 kg     Exam  General: Alert and oriented x 3, NAD  Eyes:   HEENT:    Cardiovascular: S1 S2 auscultated, no murmurs, RRR  Respiratory: Clear to auscultation bilaterally, no wheezing, rales or rhonchi  Gastrointestinal: Soft, epigastric TTP with mild distention, NBS  Ext: no pedal edema bilaterally  Neuro: No new deficit  Musculoskeletal: No digital cyanosis, clubbing  Skin: No rashes  Psych: Normal affect and demeanor, alert and oriented x3    Data Reviewed:  I have personally reviewed following labs and imaging studies  Micro Results Recent Results (from the past 240 hour(s))  SARS CORONAVIRUS 2 (TAT 6-24 HRS) Nasopharyngeal Nasopharyngeal Swab     Status: None   Collection Time: 09/10/19  4:52 PM   Specimen: Nasopharyngeal Swab  Result Value Ref Range Status   SARS Coronavirus 2 NEGATIVE NEGATIVE Final    Comment: (NOTE) SARS-CoV-2 target nucleic acids are NOT DETECTED. The SARS-CoV-2 RNA is generally detectable in upper and lower respiratory specimens during the acute phase of infection. Negative results do not preclude SARS-CoV-2 infection, do not rule out co-infections with other pathogens, and should not be used as the sole basis for treatment or other patient management decisions. Negative results must be combined with clinical observations, patient history, and epidemiological information. The expected result is Negative. Fact Sheet for Patients: HairSlick.no Fact Sheet for Healthcare Providers: quierodirigir.com This test is not yet approved or cleared by the [place] FDA and  has been authorized for detection and/or diagnosis of  SARS-CoV-2 by FDA under an Emergency Use Authorization (EUA). This EUA will remain  in effect (meaning this test can be used) for the duration of the COVID-19 declaration under Section 56 4(b)(1) of the Act, 21 U.S.C. section 360bbb-3(b)(1), unless the authorization is terminated or revoked sooner. Performed at [facility], [address], [place], [place] [address]     Radiology Reports CT ABDOMEN PELVIS W CONTRAST  Result Date: 09/10/2019 CLINICAL DATA:  Left abdominal pain. Concern for ischemic bowel or bowel obstruction. EXAM: CT ABDOMEN AND PELVIS WITH CONTRAST TECHNIQUE: Multidetector CT imaging of the abdomen and pelvis was performed using the standard protocol following bolus administration of intravenous contrast. CONTRAST:  OMNIPAQUE IOHEXOL 300 MG/ML  SOLN COMPARISON:  None. FINDINGS: Lower chest: No acute abnormality. Hepatobiliary: No focal hepatic abnormality. Gallbladder unremarkable. Pancreas: Inflammation surrounding  the pancreatic body and tail concerning for acute pancreatitis. 2.2 cm cystic lesion within the pancreatic head. No ductal dilatation. Spleen: No focal abnormality.  Normal size. Adrenals/Urinary Tract: 4 mm proximal right ureteral stone. No hydronephrosis. No renal or adrenal mass. Small cysts within the left kidney. Urinary bladder unremarkable. Stomach/Bowel: Scattered left colonic diverticulosis. No active diverticulitis. Normal appendix. Stomach and small bowel decompressed, unremarkable. Vascular/Lymphatic: Aneurysmal dilatation of the infrarenal aorta measuring up to 3.1 cm. Extensive mural plaque within the aneurysm with areas of ulceration within the plaque. Diffuse aortoiliac atherosclerosis. No adenopathy. Reproductive: Uterus and adnexa unremarkable.  No mass. Other: No free fluid or free air. Musculoskeletal: No acute bony abnormality. IMPRESSION: Stranding/inflammation around the pancreatic body and tail compatible with acute pancreatitis. 2.2  cm cystic lesion within the pancreatic head could reflect pseudo cyst or other pancreatic cyst. This could be followed with repeat CT in 6 months after resolution of pancreatitis symptoms or further characterized with elective pancreatic MRI following resolution of pancreatitis. 3.1 cm infrarenal abdominal aortic aneurysm with ulcerative plaque. 4 mm proximal right ureteral stone.  No hydronephrosis. Electronically Signed   By: Charlett Nose M.D.   On: 09/10/2019 16:10   MM 3D SCREEN BREAST BILATERAL  Result Date: 08/21/2019 CLINICAL DATA:  Screening. EXAM: DIGITAL SCREENING BILATERAL MAMMOGRAM WITH TOMO AND CAD COMPARISON:  Previous exam(s). ACR Breast Density Category b: There are scattered areas of fibroglandular density. FINDINGS: There are no findings suspicious for malignancy. Images were processed with CAD. IMPRESSION: No mammographic evidence of malignancy. A result letter of this screening mammogram will be mailed directly to the patient. RECOMMENDATION: Screening mammogram in one year. (Code:SM-B-01Y) BI-RADS CATEGORY  1: Negative. Electronically Signed   By: Amie Portland M.D.   On: 08/21/2019 15:01   CUP PACEART REMOTE DEVICE CHECK  Result Date: 08/19/2019 Carelink summary report received. Battery status OK. Normal device function. No new symptom episodes, tachy episodes, brady, or pause episodes. No new AF episodes. Monthly summary reports and ROV/PRN   Lab Data:  CBC: Recent Labs  Lab 09/10/19 1312 09/11/19 0453  WBC 14.1* 11.1*  NEUTROABS 11.5*  --   HGB 15.4* 13.6  HCT 47.2* 41.9  MCV 97.7 97.9  PLT 342 286   Basic Metabolic Panel: Recent Labs  Lab 09/10/19 1312 09/11/19 0453  NA 132* 133*  K 4.6 4.1  CL 98 103  CO2 22 23  GLUCOSE 66* 116*  BUN 17 15  CREATININE 0.61 0.65  CALCIUM 9.1 8.5*   GFR: Estimated Creatinine Clearance: 57.1 mL/min (by C-G formula based on SCr of 0.65 mg/dL). Liver Function Tests: Recent Labs  Lab 09/10/19 1312 09/11/19 0453  AST  16 17  ALT 13 13  ALKPHOS 54 49  BILITOT 0.9 0.3  PROT 6.6 5.8*  ALBUMIN 3.3* 2.8*   Recent Labs  Lab 09/10/19 1312 09/11/19 0453  LIPASE 1,653* 306*   No results for input(s): AMMONIA in the last 168 hours. Coagulation Profile: No results for input(s): INR, PROTIME in the last 168 hours. Cardiac Enzymes: No results for input(s): CKTOTAL, CKMB, CKMBINDEX, TROPONINI in the last 168 hours. BNP (last 3 results) No results for input(s): PROBNP in the last 8760 hours. HbA1C: No results for input(s): HGBA1C in the last 72 hours. CBG: Recent Labs  Lab 09/11/19 0756  GLUCAP 108*   Lipid Profile: Recent Labs    09/11/19 0453  CHOL 203*  HDL 28*  LDLCALC 158*  TRIG 85  CHOLHDL 7.3   Thyroid Function  Tests: No results for input(s): TSH, T4TOTAL, FREET4, T3FREE, THYROIDAB in the last 72 hours. Anemia Panel: No results for input(s): VITAMINB12, FOLATE, FERRITIN, TIBC, IRON, RETICCTPCT in the last 72 hours. Urine analysis:    Component Value Date/Time   COLORURINE YELLOW 09/10/2019 1302   APPEARANCEUR CLEAR 09/10/2019 1302   LABSPEC 1.030 09/10/2019 1302   PHURINE 5.0 09/10/2019 1302   GLUCOSEU NEGATIVE 09/10/2019 1302   HGBUR SMALL (A) 09/10/2019 1302   BILIRUBINUR NEGATIVE 09/10/2019 1302   BILIRUBINUR neg 07/16/2019 1306   KETONESUR 80 (A) 09/10/2019 1302   PROTEINUR NEGATIVE 09/10/2019 1302   UROBILINOGEN 0.2 07/16/2019 1306   NITRITE NEGATIVE 09/10/2019 1302   LEUKOCYTESUR NEGATIVE 09/10/2019 1302     Savi Lastinger M.D. [facility] 09/11/2019, 2:04 PM   Call night coverage person covering after 7pm

## 2019-09-11 NOTE — Progress Notes (Signed)
Patient received as transfer from [facility].  Received report from [place], Therapist, sports.  Patient comfortable in bed, VSS, oriented to unit and equipment.  Placed on telemetry and confirmed with CMT.  Will continue to monitor.

## 2019-09-12 LAB — COMPREHENSIVE METABOLIC PANEL
ALT: 12 U/L (ref 0–44)
AST: 16 U/L (ref 15–41)
Albumin: 2.4 g/dL — ABNORMAL LOW (ref 3.5–5.0)
Alkaline Phosphatase: 44 U/L (ref 38–126)
Anion gap: 10 (ref 5–15)
BUN: 8 mg/dL (ref 6–20)
CO2: 23 mmol/L (ref 22–32)
Calcium: 8.2 mg/dL — ABNORMAL LOW (ref 8.9–10.3)
Chloride: 102 mmol/L (ref 98–111)
Creatinine, Ser: 0.52 mg/dL (ref 0.44–1.00)
GFR calc Af Amer: >60 mL/min (ref >60)
GFR calc non Af Amer: >60 mL/min (ref >60)
Glucose, Bld: 58 mg/dL — ABNORMAL LOW (ref 70–99)
Potassium: 3.7 mmol/L (ref 3.5–5.1)
Sodium: 135 mmol/L (ref 135–145)
Total Bilirubin: 0.6 mg/dL (ref 0.3–1.2)
Total Protein: 5.2 g/dL — ABNORMAL LOW (ref 6.5–8.1)

## 2019-09-12 LAB — CBC
HCT: 38.6 % (ref 36.0–46.0)
Hemoglobin: 12.4 g/dL (ref 12.0–15.0)
MCH: 31.6 pg (ref 26.0–34.0)
MCHC: 32.1 g/dL (ref 30.0–36.0)
MCV: 98.5 fL (ref 80.0–100.0)
Platelets: 264 10*3/uL (ref 150–400)
RBC: 3.92 MIL/uL (ref 3.87–5.11)
RDW: 13.4 % (ref 11.5–15.5)
WBC: 8.5 10*3/uL (ref 4.0–10.5)
nRBC: 0 % (ref 0.0–0.2)

## 2019-09-12 LAB — GLUCOSE, CAPILLARY: Glucose-Capillary: 53 mg/dL — ABNORMAL LOW (ref 70–99)

## 2019-09-12 LAB — LIPASE, BLOOD: Lipase: 211 U/L — ABNORMAL HIGH (ref 11–51)

## 2019-09-12 NOTE — Progress Notes (Signed)
[facility]                                                                              Patient Demographics  [name], is a 58 y.o. female, DOB - [birth year], ZHY:[id number]  Admit date - 09/10/2019   Admitting Physician Briant Cedar, MD  Outpatient Primary MD for the patient is Natalia Leatherwood, DO  Outpatient specialists:   LOS - 2  days   Medical records reviewed and are as summarized below:    Chief Complaint  Patient presents with  . Abdominal Pain       Brief summary    [name] is a 58 y.o. female with medical history significant for CAD, ischemic cardiomyopathy, paroxysmal A. fib, CVA presents to the ED, complaining of generalized abdominal pain, worse around the epigastric region radiating to the back, poor oral intake and abdominal distention that has been ongoing for the past 4 days.  Patient describes the pain as sharp worse in the epigastric region radiating to the back, due to the pain unable to tolerate orally, no significant BM for the past 4 days as well.  Due to the pain, patient went to her PCP clinic who subsequently referred patient to the ED for further evaluation.  Patient denies any chest pain, shortness of breath, fever/chills.  Patient denies any alcohol intake, no history of gallstones in the past. In ED, WBCs 14.1, hypertension BP 83/56, tachycardia, lipase 1653 patient was admitted for further work-up  Assessment & Plan    Principal Problem:   Acute pancreatitis with sepsis -Patient met sepsis criteria at the time of admission due to leukocytosis, tachycardia, hypotension likely due to acute pancreatitis -Unclear etiology, patient reports not drinking alcohol, triglycerides 85, no gallstones seen on the CT -CT abdomen showed acute pancreatitis with possible 2.2 cm cyst versus pseudocyst in the head of the pancreas -Patient was placed on IV fluids, n.p.o., pain control.  GI was consulted -Today feeling somewhat  better, placed on clears this morning, advance to full liquids later today -Per GI, plan for repeat CT as an outpatient in 6 to 8 weeks to assess pancreatic cyst.  If still present, will need EUS.  Active Problems: History of cerebrovascular accident (CVA) due to occlusion of left middle cerebral artery (HCC) with residual right upper extremity weakness -Continue Xarelto  CAD with history of NSTEMI, CABG x2 -No chest pain or shortness of breath.   -Borderline BP, continue to hold Lasix, Coreg, Entresto.      Chronic combined systolic and diastolic CHF (congestive heart failure) (HCC) -Currently euvolemic, continue gentle hydration -Hold home Lasix, Coreg, Entresto due to borderline BP    Paroxysmal atrial fibrillation (HCC) Heart rate controlled, continue Xarelto  Code Status: Full CODE STATUS DVT Prophylaxis: Xarelto Family Communication: Discussed all imaging results, lab results, explained to the patient's husband on phone    Disposition Plan: hopefully DC home in am if tolerating solid diet  Time Spent in minutes   35 minutes  Procedures:  CT abdomen  Consultants:   GI  Antimicrobials:   Anti-infectives (From admission, onward)   None  Medications  Scheduled Meds: . hydrOXYzine  10 mg Oral QHS  . rivaroxaban  20 mg Oral Q supper  . sodium chloride flush  3 mL Intravenous Q12H   Continuous Infusions: . lactated ringers 125 mL/hr at 09/12/19 1213   PRN Meds:.acetaminophen **OR** acetaminophen, albuterol, bisacodyl, HYDROmorphone (DILAUDID) injection, ondansetron **OR** ondansetron (ZOFRAN) IV      Subjective:   [name] was seen and examined today.  Feeling somewhat better today, abdominal pain improving, feeling hungry.  Bloating improving.  No dizziness, lightheadedness nausea or vomiting.  No fevers.  No acute events overnight  Objective:   Vitals:   09/11/19 1734 09/11/19 2048 09/12/19 0544 09/12/19 0943  BP:  93/77 96/65 117/62    Pulse:  (!) 59 99 86  Resp:  18 18 14   Temp:  98 F (36.7 C) 98.2 F (36.8 C) 98.2 F (36.8 C)  TempSrc:  Oral Oral   SpO2:  94% 94% 96%  Weight: 57.2 kg     Height: 5\' 1"  (1.549 m)       Intake/Output Summary (Last 24 hours) at 09/12/2019 1452 Last data filed at 09/12/2019 0659 Gross per 24 hour  Intake 3037.14 ml  Output --  Net 3037.14 ml     Wt Readings from Last 3 Encounters:  09/11/19 57.2 kg  09/10/19 55.5 kg  08/27/19 56.2 kg   Physical Exam  General: Alert and oriented x 3, NAD  Eyes:   HEENT:  Atraumatic, normocephalic  Cardiovascular: S1 S2 clear, no murmurs, RRR. No pedal edema b/l  Respiratory: CTAB, no wheezing, rales or rhonchi  Gastrointestinal: Soft, mild epigastric TTP , nondistended, NBS  Ext: no pedal edema bilaterally  Neuro: no new deficits  Musculoskeletal: No cyanosis, clubbing  Skin: No rashes  Psych: Normal affect and demeanor, alert and oriented x3       Data Reviewed:  I have personally reviewed following labs and imaging studies  Micro Results Recent Results (from the past 240 hour(s))  SARS CORONAVIRUS 2 (TAT 6-24 HRS) Nasopharyngeal Nasopharyngeal Swab     Status: None   Collection Time: 09/10/19  4:52 PM   Specimen: Nasopharyngeal Swab  Result Value Ref Range Status   SARS Coronavirus 2 NEGATIVE NEGATIVE Final    Comment: (NOTE) SARS-CoV-2 target nucleic acids are NOT DETECTED. The SARS-CoV-2 RNA is generally detectable in upper and lower respiratory specimens during the acute phase of infection. Negative results do not preclude SARS-CoV-2 infection, do not rule out co-infections with other pathogens, and should not be used as the sole basis for treatment or other patient management decisions. Negative results must be combined with clinical observations, patient history, and epidemiological information. The expected result is Negative. Fact Sheet for Patients: HairSlick.no Fact  Sheet for Healthcare Providers: quierodirigir.com This test is not yet approved or cleared by the [place] FDA and  has been authorized for detection and/or diagnosis of SARS-CoV-2 by FDA under an Emergency Use Authorization (EUA). This EUA will remain  in effect (meaning this test can be used) for the duration of the COVID-19 declaration under Section 56 4(b)(1) of the Act, 21 U.S.C. section 360bbb-3(b)(1), unless the authorization is terminated or revoked sooner. Performed at [facility], [address] [place], [place] [address]   Culture, blood (routine x 2)     Status: None (Preliminary result)   Collection Time: 09/11/19  2:53 PM   Specimen: BLOOD  Result Value Ref Range Status   Specimen Description   Final  BLOOD RIGHT ARM Performed at [facility], [address], [place], [place] [address]    Special Requests   Final    BOTTLES DRAWN AEROBIC ONLY Blood Culture adequate volume Performed at [facility], [address], [place], [place] [address]    Culture   Final    NO GROWTH < 24 HOURS Performed at [facility], [address] [place], [place] [address]    Report Status PENDING  Incomplete  Culture, blood (routine x 2)     Status: None (Preliminary result)   Collection Time: 09/11/19  2:53 PM   Specimen: BLOOD  Result Value Ref Range Status   Specimen Description   Final    BLOOD RIGHT ANTECUBITAL Performed at [facility], [address], [place], [place] [address]    Special Requests   Final    BOTTLES DRAWN AEROBIC ONLY Blood Culture adequate volume Performed at [facility], [address] [place], [place] [address]    Culture   Final    NO GROWTH < 24 HOURS Performed at [facility], [address] [place], [place] [address]    Report Status PENDING  Incomplete    Radiology Reports CT ABDOMEN PELVIS W CONTRAST  Result  Date: 09/10/2019 CLINICAL DATA:  Left abdominal pain. Concern for ischemic bowel or bowel obstruction. EXAM: CT ABDOMEN AND PELVIS WITH CONTRAST TECHNIQUE: Multidetector CT imaging of the abdomen and pelvis was performed using the standard protocol following bolus administration of intravenous contrast. CONTRAST:  OMNIPAQUE IOHEXOL 300 MG/ML  SOLN COMPARISON:  None. FINDINGS: Lower chest: No acute abnormality. Hepatobiliary: No focal hepatic abnormality. Gallbladder unremarkable. Pancreas: Inflammation surrounding the pancreatic body and tail concerning for acute pancreatitis. 2.2 cm cystic lesion within the pancreatic head. No ductal dilatation. Spleen: No focal abnormality.  Normal size. Adrenals/Urinary Tract: 4 mm proximal right ureteral stone. No hydronephrosis. No renal or adrenal mass. Small cysts within the left kidney. Urinary bladder unremarkable. Stomach/Bowel: Scattered left colonic diverticulosis. No active diverticulitis. Normal appendix. Stomach and small bowel decompressed, unremarkable. Vascular/Lymphatic: Aneurysmal dilatation of the infrarenal aorta measuring up to 3.1 cm. Extensive mural plaque within the aneurysm with areas of ulceration within the plaque. Diffuse aortoiliac atherosclerosis. No adenopathy. Reproductive: Uterus and adnexa unremarkable.  No mass. Other: No free fluid or free air. Musculoskeletal: No acute bony abnormality. IMPRESSION: Stranding/inflammation around the pancreatic body and tail compatible with acute pancreatitis. 2.2 cm cystic lesion within the pancreatic head could reflect pseudo cyst or other pancreatic cyst. This could be followed with repeat CT in 6 months after resolution of pancreatitis symptoms or further characterized with elective pancreatic MRI following resolution of pancreatitis. 3.1 cm infrarenal abdominal aortic aneurysm with ulcerative plaque. 4 mm proximal right ureteral stone.  No hydronephrosis. Electronically Signed   By: Charlett Nose M.D.    On: 09/10/2019 16:10   MM 3D SCREEN BREAST BILATERAL  Result Date: 08/21/2019 CLINICAL DATA:  Screening. EXAM: DIGITAL SCREENING BILATERAL MAMMOGRAM WITH TOMO AND CAD COMPARISON:  Previous exam(s). ACR Breast Density Category b: There are scattered areas of fibroglandular density. FINDINGS: There are no findings suspicious for malignancy. Images were processed with CAD. IMPRESSION: No mammographic evidence of malignancy. A result letter of this screening mammogram will be mailed directly to the patient. RECOMMENDATION: Screening mammogram in one year. (Code:SM-B-01Y) BI-RADS CATEGORY  1: Negative. Electronically Signed   By: Amie Portland M.D.   On: 08/21/2019 15:01   CUP  PACEART REMOTE DEVICE CHECK  Result Date: 08/19/2019 Carelink summary report received. Battery status OK. Normal device function. No new symptom episodes, tachy episodes, brady, or pause episodes. No new AF episodes. Monthly summary reports and ROV/PRN   Lab Data:  CBC: Recent Labs  Lab 09/10/19 1312 09/11/19 0453 09/12/19 0536  WBC 14.1* 11.1* 8.5  NEUTROABS 11.5*  --   --   HGB 15.4* 13.6 12.4  HCT 47.2* 41.9 38.6  MCV 97.7 97.9 98.5  PLT 342 286 264   Basic Metabolic Panel: Recent Labs  Lab 09/10/19 1312 09/11/19 0453 09/12/19 0536  NA 132* 133* 135  K 4.6 4.1 3.7  CL 98 103 102  CO2 22 23 23   GLUCOSE 66* 116* 58*  BUN 17 15 8   CREATININE 0.61 0.65 0.52  CALCIUM 9.1 8.5* 8.2*   GFR: Estimated Creatinine Clearance: 57.1 mL/min (by C-G formula based on SCr of 0.52 mg/dL). Liver Function Tests: Recent Labs  Lab 09/10/19 1312 09/11/19 0453 09/12/19 0536  AST 16 17 16   ALT 13 13 12   ALKPHOS 54 49 44  BILITOT 0.9 0.3 0.6  PROT 6.6 5.8* 5.2*  ALBUMIN 3.3* 2.8* 2.4*   Recent Labs  Lab 09/10/19 1312 09/11/19 0453 09/12/19 0536  LIPASE 1,653* 306* 211*   No results for input(s): AMMONIA in the last 168 hours. Coagulation Profile: No results for input(s): INR, PROTIME in the last 168  hours. Cardiac Enzymes: No results for input(s): CKTOTAL, CKMB, CKMBINDEX, TROPONINI in the last 168 hours. BNP (last 3 results) No results for input(s): PROBNP in the last 8760 hours. HbA1C: No results for input(s): HGBA1C in the last 72 hours. CBG: Recent Labs  Lab 09/11/19 0756 09/12/19 0939  GLUCAP 108* 53*   Lipid Profile: Recent Labs    09/11/19 0453  CHOL 203*  HDL 28*  LDLCALC 158*  TRIG 85  CHOLHDL 7.3   Thyroid Function Tests: No results for input(s): TSH, T4TOTAL, FREET4, T3FREE, THYROIDAB in the last 72 hours. Anemia Panel: No results for input(s): VITAMINB12, FOLATE, FERRITIN, TIBC, IRON, RETICCTPCT in the last 72 hours. Urine analysis:    Component Value Date/Time   COLORURINE YELLOW 09/10/2019 1302   APPEARANCEUR CLEAR 09/10/2019 1302   LABSPEC 1.030 09/10/2019 1302   PHURINE 5.0 09/10/2019 1302   GLUCOSEU NEGATIVE 09/10/2019 1302   HGBUR SMALL (A) 09/10/2019 1302   BILIRUBINUR NEGATIVE 09/10/2019 1302   BILIRUBINUR neg 07/16/2019 1306   KETONESUR 80 (A) 09/10/2019 1302   PROTEINUR NEGATIVE 09/10/2019 1302   UROBILINOGEN 0.2 07/16/2019 1306   NITRITE NEGATIVE 09/10/2019 1302   LEUKOCYTESUR NEGATIVE 09/10/2019 1302     Raji Glinski M.D. [facility] 09/12/2019, 2:52 PM   Call night coverage person covering after 7pm

## 2019-09-12 NOTE — Progress Notes (Signed)
Progress Note   Subjective  Chief Complaint: Acute pancreatitis  Patient continues to improve, tolerating a clear liquid diet this morning and abdominal pain is minimal at this point.  Denies any new complaints or concerns.    Objective   Vital signs in last 24 hours: Temp:  [97.4 F (36.3 C)-98.2 F (36.8 C)] 98.2 F (36.8 C) (12/17 0943) Pulse Rate:  [59-109] 86 (12/17 0943) Resp:  [14-18] 14 (12/17 0943) BP: (89-117)/(55-77) 117/62 (12/17 0943) SpO2:  [89 %-96 %] 96 % (12/17 0943) Weight:  [57.2 kg] 57.2 kg (12/16 1734) Last BM Date: 09/04/19 General:    white female in NAD Heart:  Regular rate and rhythm; no murmurs Lungs: Respirations even and unlabored, lungs CTA bilaterally Abdomen:  Soft, mild epigastric TTP and nondistended. Normal bowel sounds. Extremities:  Without edema. Neurologic:  Alert and oriented,  grossly normal neurologically. Psych:  Cooperative. Normal mood and affect.  Intake/Output from previous day: 12/16 0701 - 12/17 0700 In: 3540.8 [I.V.:2861.8; IV Piggyback:679] Out: -   Lab Results: Recent Labs    09/10/19 1312 09/11/19 0453 09/12/19 0536  WBC 14.1* 11.1* 8.5  HGB 15.4* 13.6 12.4  HCT 47.2* 41.9 38.6  PLT 342 286 264   BMET Recent Labs    09/10/19 1312 09/11/19 0453 09/12/19 0536  NA 132* 133* 135  K 4.6 4.1 3.7  CL 98 103 102  CO2 22 23 23   GLUCOSE 66* 116* 58*  BUN 17 15 8   CREATININE 0.61 0.65 0.52  CALCIUM 9.1 8.5* 8.2*   LFT Recent Labs    09/12/19 0536  PROT 5.2*  ALBUMIN 2.4*  AST 16  ALT 12  ALKPHOS 44  BILITOT 0.6   Studies/Results: CT ABDOMEN PELVIS W CONTRAST  Result Date: 09/10/2019 CLINICAL DATA:  Left abdominal pain. Concern for ischemic bowel or bowel obstruction. EXAM: CT ABDOMEN AND PELVIS WITH CONTRAST TECHNIQUE: Multidetector CT imaging of the abdomen and pelvis was performed using the standard protocol following bolus administration of intravenous contrast. CONTRAST:  160mL OMNIPAQUE  IOHEXOL 300 MG/ML  SOLN COMPARISON:  None. FINDINGS: Lower chest: No acute abnormality. Hepatobiliary: No focal hepatic abnormality. Gallbladder unremarkable. Pancreas: Inflammation surrounding the pancreatic body and tail concerning for acute pancreatitis. 2.2 cm cystic lesion within the pancreatic head. No ductal dilatation. Spleen: No focal abnormality.  Normal size. Adrenals/Urinary Tract: 4 mm proximal right ureteral stone. No hydronephrosis. No renal or adrenal mass. Small cysts within the left kidney. Urinary bladder unremarkable. Stomach/Bowel: Scattered left colonic diverticulosis. No active diverticulitis. Normal appendix. Stomach and small bowel decompressed, unremarkable. Vascular/Lymphatic: Aneurysmal dilatation of the infrarenal aorta measuring up to 3.1 cm. Extensive mural plaque within the aneurysm with areas of ulceration within the plaque. Diffuse aortoiliac atherosclerosis. No adenopathy. Reproductive: Uterus and adnexa unremarkable.  No mass. Other: No free fluid or free air. Musculoskeletal: No acute bony abnormality. IMPRESSION: Stranding/inflammation around the pancreatic body and tail compatible with acute pancreatitis. 2.2 cm cystic lesion within the pancreatic head could reflect pseudo cyst or other pancreatic cyst. This could be followed with repeat CT in 6 months after resolution of pancreatitis symptoms or further characterized with elective pancreatic MRI following resolution of pancreatitis. 3.1 cm infrarenal abdominal aortic aneurysm with ulcerative plaque. 4 mm proximal right ureteral stone.  No hydronephrosis. Electronically Signed   By: Rolm Baptise M.D.   On: 09/10/2019 16:10    Assessment / Plan:   Assessment: 1.  Acute pancreatitis: Lipase trending down, pain resolving, thought likely from  pancreatic cyst 2.  Leukocytosis 3.  Chronic combined systolic and diastolic HF/ischemic cardiomyopathy 4.  CAD/history of CABG 5.  Paroxysmal A. fib on Xarelto 6.  History of CVA 7.   Constipation  Plan: 1.  Continue fluids and pain control as well as antiemetics 2.  Advance diet as tolerated to possible full liquid diet later today 3.   We will plan for repeat CT as an outpatient in 6 to 8 weeks to reassess the pancreatic cyst, if still present would proceed with EUS with FNA 4.  Please await any further recommendations from Dr. Bryan Lemma later today.  Hopefully plan for discharge tomorrow.  Thank you for kind consultation, we will continue to follow.   LOS: 2 days   Levin Erp  09/12/2019, 11:33 AM

## 2019-09-13 ENCOUNTER — Telehealth: Payer: Self-pay

## 2019-09-13 ENCOUNTER — Inpatient Hospital Stay (HOSPITAL_COMMUNITY): Payer: BC Managed Care – PPO

## 2019-09-13 LAB — GLUCOSE, CAPILLARY
Glucose-Capillary: 78 mg/dL (ref 70–99)
Glucose-Capillary: 70 mg/dL (ref 70–99)

## 2019-09-13 LAB — COMPREHENSIVE METABOLIC PANEL
ALT: 13 U/L (ref 0–44)
AST: 18 U/L (ref 15–41)
Albumin: 2.9 g/dL — ABNORMAL LOW (ref 3.5–5.0)
Alkaline Phosphatase: 51 U/L (ref 38–126)
Anion gap: 9 (ref 5–15)
BUN: <5 mg/dL — ABNORMAL LOW (ref 6–20)
CO2: 26 mmol/L (ref 22–32)
Calcium: 8.6 mg/dL — ABNORMAL LOW (ref 8.9–10.3)
Chloride: 104 mmol/L (ref 98–111)
Creatinine, Ser: 0.52 mg/dL (ref 0.44–1.00)
GFR calc Af Amer: >60 mL/min (ref >60)
GFR calc non Af Amer: >60 mL/min (ref >60)
Glucose, Bld: 82 mg/dL (ref 70–99)
Potassium: 3.8 mmol/L (ref 3.5–5.1)
Sodium: 139 mmol/L (ref 135–145)
Total Bilirubin: 1.1 mg/dL (ref 0.3–1.2)
Total Protein: 6 g/dL — ABNORMAL LOW (ref 6.5–8.1)

## 2019-09-13 LAB — CBC
HCT: 40.6 % (ref 36.0–46.0)
Hemoglobin: 13.3 g/dL (ref 12.0–15.0)
MCH: 32 pg (ref 26.0–34.0)
MCHC: 32.8 g/dL (ref 30.0–36.0)
MCV: 97.6 fL (ref 80.0–100.0)
Platelets: 312 10*3/uL (ref 150–400)
RBC: 4.16 MIL/uL (ref 3.87–5.11)
RDW: 13.1 % (ref 11.5–15.5)
WBC: 9.1 10*3/uL (ref 4.0–10.5)
nRBC: 0 % (ref 0.0–0.2)

## 2019-09-13 LAB — LIPASE, BLOOD: Lipase: 204 U/L — ABNORMAL HIGH (ref 11–51)

## 2019-09-13 MED ORDER — ACETAMINOPHEN 325 MG PO TABS: 650.0000 mg | ORAL_TABLET | Freq: Four times a day (QID) | ORAL | Status: DC | PRN

## 2019-09-13 MED ORDER — ENTRESTO 24-26 MG PO TABS: 1.0000 | ORAL_TABLET | Freq: Two times a day (BID) | ORAL | 3 refills | Status: DC

## 2019-09-13 MED ORDER — FUROSEMIDE 20 MG PO TABS: 10.0000 mg | ORAL_TABLET | Freq: Every day | ORAL | 1 refills | Status: DC

## 2019-09-13 MED ORDER — POTASSIUM CHLORIDE ER 20 MEQ PO TBCR: 1.0000 | EXTENDED_RELEASE_TABLET | Freq: Every day | ORAL | 11 refills | Status: DC

## 2019-09-13 MED ORDER — ONDANSETRON 4 MG PO TBDP: 4.0000 mg | ORAL_TABLET | Freq: Three times a day (TID) | ORAL | 0 refills | Status: DC | PRN

## 2019-09-13 NOTE — Plan of Care (Signed)
Completed.

## 2019-09-13 NOTE — Progress Notes (Signed)
This RN called to pt's room. Pt upset and asked this RN why people were out in the hall accusing her of smoking. RN explained to pt that no one was outside her door or saying those things. Pt went on to tell nurse that she has been hearing gospel music in the hall, people talking to her, and seeing cameras come out of the wall. On call provider, Jeannette Corpus, notified and orders received.  Pt vitals stable. CBG at 78. No other deficits from baseline noted.

## 2019-09-13 NOTE — Telephone Encounter (Signed)
-----   Message from [place], [place] sent at 09/13/2019  8:53 AM EST ----- Regarding: follow up for acute pancreatitis Please get patient follow up with either Dr. Bryan Lemma or myself in 4 weeks. Thanks-JLL

## 2019-09-13 NOTE — Progress Notes (Signed)
Progress Note   Subjective  Chief Complaint: Acute pancreatitis  This morning, the patient tells me that she is feeling much better with only minimal epigastric pain.  She is all dressed and ready to leave as she was told she could be discharged.   Objective   Vital signs in last 24 hours: Temp:  [97.8 F (36.6 C)-98.5 F (36.9 C)] 97.8 F (36.6 C) (12/18 0618) Pulse Rate:  [78-95] 95 (12/18 0618) Resp:  [14-18] 18 (12/18 0618) BP: (111-137)/(62-77) 137/77 (12/18 0618) SpO2:  [92 %-96 %] 96 % (12/18 0618) Last BM Date: 09/12/19 General:    white female in NAD Heart:  Regular rate and rhythm; no murmurs Lungs: Respirations even and unlabored, lungs CTA bilaterally Abdomen:  Soft, mild epigastric TTP and nondistended. Normal bowel sounds. Extremities:  Without edema. Neurologic:  Alert and oriented,  grossly normal neurologically. Psych:  Cooperative. Normal mood and affect.  Intake/Output from previous day: 12/17 0701 - 12/18 0700 In: 27 [P.O.:436] Out: -   Lab Results: Recent Labs    09/11/19 0453 09/12/19 0536 09/13/19 0437  WBC 11.1* 8.5 9.1  HGB 13.6 12.4 13.3  HCT 41.9 38.6 40.6  PLT 286 264 312   BMET Recent Labs    09/11/19 0453 09/12/19 0536 09/13/19 0437  NA 133* 135 139  K 4.1 3.7 3.8  CL 103 102 104  CO2 23 23 26   GLUCOSE 116* 58* 82  BUN 15 8 <5*  CREATININE 0.65 0.52 0.52  CALCIUM 8.5* 8.2* 8.6*   LFT Recent Labs    09/13/19 0437  PROT 6.0*  ALBUMIN 2.9*  AST 18  ALT 13  ALKPHOS 51  BILITOT 1.1   Studies/Results: CT HEAD WO CONTRAST  Result Date: 09/13/2019 CLINICAL DATA:  Altered mental status (AMS), unclear cause. Additional history provided: Patient having visual and auditory hallucinations EXAM: CT HEAD WITHOUT CONTRAST TECHNIQUE: Contiguous axial images were obtained from the base of the skull through the vertex without intravenous contrast. COMPARISON:  Brain MRI 08/22/2017, head CT 08/22/2017 FINDINGS: Brain: No  evidence of acute intracranial hemorrhage. No acute demarcated cortical infarction. Redemonstrated, now chronic, infarcts within the left parietal lobe, left corona radiata/basal ganglia, left insula and left temporal lobe. Known chronic infarcts within the left frontal and occipital lobes were better appreciated on prior brain MRI. No evidence of intracranial mass. No midline shift or extra-axial fluid collection. Mild ill-defined hypoattenuation within the cerebral white matter is nonspecific, but consistent with chronic small vessel ischemic disease. Mild generalized parenchymal atrophy. Vascular: No hyperdense vessel.  Atherosclerotic calcifications. Skull: Normal. Negative for fracture or focal lesion. Sinuses/Orbits: Visualized orbits demonstrate no acute abnormality. No significant paranasal sinus disease or mastoid effusion at the imaged levels. IMPRESSION: No evidence of acute intracranial abnormality. Multiple redemonstrated chronic infarcts as described. Background of mild chronic small vessel ischemic disease. Electronically Signed   By: Kellie Simmering DO   On: 09/13/2019 07:16    Assessment / Plan:   Assessment: 1.  Acute pancreatitis: Pain now resolved after 48 hours in the hospital, patient tolerating diet 2.  Chronic combined systolic and diastolic HF/ischemic cardiomyopathy 3.  CAD/history of CABG 4.  Paroxysmal A. fib on Xarelto 5.  History of CVA  Plan: 1.  Agree with discharge home today 2.  Discussed low-fat diet 3.  Discussed with patient that she will need a repeat CT as an outpatient in 6 to 8 weeks to reassess the pancreatic cyst, if still present then will discuss  EUS with FNA 4.  We will arrange for follow-up appointment with either Dr. Bryan Lemma or myself in 3 to 4 weeks.  Thank you for your kind consultation, we will sign off.    LOS: 3 days   Levin Erp  09/13/2019, 8:48 AM

## 2019-09-13 NOTE — Telephone Encounter (Signed)
-----   Message from [place], [place] sent at 09/13/2019  8:53 AM EST ----- Regarding: follow up for acute pancreatitis Please get patient follow up with either Dr. Bryan Lemma or myself in 4 weeks. Thanks-JLL

## 2019-09-13 NOTE — Telephone Encounter (Signed)
Pt scheduled to see Dr. Tracie Harrier 10/11/19 @10 :40am.

## 2019-09-13 NOTE — Progress Notes (Signed)
RN reviewed discharge instructions with patient and family. All questions answered.   Paperwork and prescriptions given.   NT rolled patient down with all belongings to family car. 

## 2019-09-13 NOTE — Discharge Summary (Signed)
Physician Discharge Summary   Patient ID: MONTGOMERY ROTHLISBERGER MRN: [id number] DOB/AGE: [birth year] 58 y.o.  Admit date: 09/10/2019 Discharge date: 09/13/2019  Primary Care Physician:  [name], DO   Recommendations for Outpatient Follow-up:  1. Follow up with PCP in 1-2 weeks 2. GI follow-up scheduled with Dr. Bryan Lemma regarding pancreatitis, pseudocyst 3. Follow-up CT abdomen in 6 to 8 weeks  Home Health: None  Equipment/Devices:   Discharge Condition: stable  CODE STATUS: FULL  Diet recommendation: Heart healthy low-fat diet   Discharge Diagnoses:    . Sepsis secondary to acute pancreatitis, unclear etiology . Cerebrovascular accident (CVA) due to occlusion of left middle cerebral artery ([place]) . NSTEMI (non-ST elevated myocardial infarction) ([place]) . Chronic combined systolic and diastolic CHF (congestive heart failure) ([place]) . Paroxysmal atrial fibrillation (HCC)   Consults: Gastroenterology, Dr. Bryan Lemma    Allergies:   Allergies  Allergen Reactions  . Lopressor [Metoprolol Tartrate] Swelling    Angioedema  . Allegra Allergy [Fexofenadine Hcl] Hives  . Nsaids     On Xarelto  . Gabapentin (Once-Daily) Rash  . Lipitor [Atorvastatin] Diarrhea     DISCHARGE MEDICATIONS: Allergies as of 09/13/2019      Reactions   Lopressor [metoprolol Tartrate] Swelling   Angioedema   Allegra Allergy [fexofenadine Hcl] Hives   Nsaids    On Xarelto   Gabapentin (once-daily) Rash   Lipitor [atorvastatin] Diarrhea      Medication List    TAKE these medications   acetaminophen 325 MG tablet Commonly known as: TYLENOL Take 2 tablets (650 mg total) by mouth every 6 (six) hours as needed for mild pain or moderate pain (or Fever >/= 101).   Azelastine-Fluticasone 137-50 MCG/ACT Susp Place 1 spray into both nostrils 2 (two) times daily as needed.   baclofen 10 MG tablet Commonly known as: LIORESAL TAKE 1 TABLET BY MOUTH THREE TIMES DAILY   calcium carbonate  500 MG chewable tablet Commonly known as: TUMS - dosed in mg elemental calcium Chew 1 tablet by mouth 2 (two) times daily.   carvedilol 3.125 MG tablet Commonly known as: COREG Take 1 tablet by mouth twice daily   cholecalciferol 1000 units tablet Commonly known as: VITAMIN D Take 1,000 Units by mouth daily.   Entresto 24-26 MG Generic drug: sacubitril-valsartan Take 1 tablet by mouth 2 (two) times daily. Start taking on: September 15, 2019 What changed: These instructions start on September 15, 2019. If you are unsure what to do until then, ask your doctor or other care provider.   ferrous sulfate 325 (65 FE) MG tablet Take 325 mg by mouth daily with breakfast. Taking twice weekly   furosemide 20 MG tablet Commonly known as: LASIX Take 0.5 tablets (10 mg total) by mouth daily. Start taking on: September 14, 2019   hydrOXYzine 10 MG tablet Commonly known as: ATARAX/VISTARIL Take 1 tablet (10 mg total) by mouth at bedtime.   levocetirizine 5 MG tablet Commonly known as: Xyzal Take 0.5 tablets (2.5 mg total) by mouth every evening.   loratadine 10 MG tablet Commonly known as: CLARITIN Take 1 tablet (10 mg total) by mouth daily.   ondansetron 4 MG disintegrating tablet Commonly known as: Zofran ODT Take 1 tablet (4 mg total) by mouth every 8 (eight) hours as needed for nausea or vomiting.   Potassium Chloride ER 20 MEQ Tbcr Take 1 tablet by mouth daily. Start taking on: September 14, 2019   triamcinolone cream 0.1 % Commonly known as: KENALOG Apply 1  application topically 2 (two) times daily. What changed:   when to take this  reasons to take this   vitamin B-12 100 MCG tablet Commonly known as: CYANOCOBALAMIN Take 100 mcg by mouth daily.   Xarelto 20 MG Tabs tablet Generic drug: rivaroxaban TAKE 1 TABLET BY MOUTH ONCE DAILY WITH SUPPER What changed: See the new instructions.        Brief H and P: For complete details please refer to admission H and P,  but in brief [name] a 58 y.o.femalewith medical history significant forCAD, ischemic cardiomyopathy, paroxysmal A. fib, CVA presents to the ED, complaining of generalized abdominal pain, worse around the epigastric region radiating to the back, poor oral intake and abdominal distention that has been ongoing for the past 4 days. Patient describes the pain as sharp worse in the epigastric region radiating to the back, due to the pain unable to tolerate orally, no significant BM for the past 4 days as well. Due to the pain, patient went to her PCP clinic who subsequently referred patient to the ED for further evaluation. Patient denies any chest pain, shortness of breath, fever/chills.Patient denies any alcohol intake, no history of gallstones in the past. In ED, WBCs 14.1, hypertension BP 83/56, tachycardia, lipase 1653 patient was admitted for further work-up  Hospital Course:    Acute pancreatitis with sepsis -Patient met sepsis criteria at the time of admission due to leukocytosis, tachycardia, hypotension likely due to acute pancreatitis -Unclear etiology, patient reports not drinking alcohol, triglycerides 85, no gallstones seen on the CT -CT abdomen showed acute pancreatitis with possible 2.2 cm cyst versus pseudocyst in the head of the pancreas -GI was consulted.  Patient was placed on n.p.o. status with IV fluids, pain control.  Patient had significant improvement and currently tolerating solid diet without any difficulty. -Per GI, plan for repeat CT as an outpatient in 6 to 8 weeks to assess pancreatic cyst.  If still present, will need EUS.  Outpatient follow-up with Dr. Bryan Lemma scheduled.   History of cerebrovascular accident (CVA) due to occlusion of left middle cerebral artery (HCC) with residual right upper extremity weakness -Continue Xarelto, no worsening of the residual weakness  CAD with history of NSTEMI, CABG x2 -No chest pain or shortness of breath.    -BP improving, patient recommended to restart Coreg, then gradually restart Lasix and Entresto.    Chronic combined systolic and diastolic CHF (congestive heart failure) ([place]) -Doing well, euvolemic, restart Lasix outpatient    Paroxysmal atrial fibrillation (HCC) Heart rate controlled, continue Xarelto   Day of Discharge S: Feels better tolerating solid diet, no nausea or vomiting or worsening abdominal pain.  No fevers, wants to go home  BP 137/77 (BP Location: Left Arm)   Pulse 95   Temp 97.8 F (36.6 C)   Resp 18   Ht 5' 1"  (1.549 m)   Wt 57.2 kg   LMP  (LMP Unknown)   SpO2 96%   BMI 23.81 kg/m   Physical Exam: General: Alert and awake oriented x3 not in any acute distress. HEENT: anicteric sclera, pupils reactive to light and accommodation CVS: S1-S2 clear no murmur rubs or gallops Chest: clear to auscultation bilaterally, no wheezing rales or rhonchi Abdomen: soft nontender, nondistended, normal bowel sounds Extremities: no cyanosis, clubbing or edema noted bilaterally Neuro: Cranial nerves II-XII intact, no focal neurological deficits   The results of significant diagnostics from this hospitalization (including imaging, microbiology, ancillary and laboratory) are listed below for reference.  Procedures/Studies:  CT HEAD WO CONTRAST  Result Date: 09/13/2019 CLINICAL DATA:  Altered mental status (AMS), unclear cause. Additional history provided: Patient having visual and auditory hallucinations EXAM: CT HEAD WITHOUT CONTRAST TECHNIQUE: Contiguous axial images were obtained from the base of the skull through the vertex without intravenous contrast. COMPARISON:  Brain MRI 08/22/2017, head CT 08/22/2017 FINDINGS: Brain: No evidence of acute intracranial hemorrhage. No acute demarcated cortical infarction. Redemonstrated, now chronic, infarcts within the left parietal lobe, left corona radiata/basal ganglia, left insula and left temporal lobe. Known chronic  infarcts within the left frontal and occipital lobes were better appreciated on prior brain MRI. No evidence of intracranial mass. No midline shift or extra-axial fluid collection. Mild ill-defined hypoattenuation within the cerebral white matter is nonspecific, but consistent with chronic small vessel ischemic disease. Mild generalized parenchymal atrophy. Vascular: No hyperdense vessel.  Atherosclerotic calcifications. Skull: Normal. Negative for fracture or focal lesion. Sinuses/Orbits: Visualized orbits demonstrate no acute abnormality. No significant paranasal sinus disease or mastoid effusion at the imaged levels. IMPRESSION: No evidence of acute intracranial abnormality. Multiple redemonstrated chronic infarcts as described. Background of mild chronic small vessel ischemic disease. Electronically Signed   By: Kellie Simmering DO   On: 09/13/2019 07:16   CT ABDOMEN PELVIS W CONTRAST  Result Date: 09/10/2019 CLINICAL DATA:  Left abdominal pain. Concern for ischemic bowel or bowel obstruction. EXAM: CT ABDOMEN AND PELVIS WITH CONTRAST TECHNIQUE: Multidetector CT imaging of the abdomen and pelvis was performed using the standard protocol following bolus administration of intravenous contrast. CONTRAST:  125m OMNIPAQUE IOHEXOL 300 MG/ML  SOLN COMPARISON:  None. FINDINGS: Lower chest: No acute abnormality. Hepatobiliary: No focal hepatic abnormality. Gallbladder unremarkable. Pancreas: Inflammation surrounding the pancreatic body and tail concerning for acute pancreatitis. 2.2 cm cystic lesion within the pancreatic head. No ductal dilatation. Spleen: No focal abnormality.  Normal size. Adrenals/Urinary Tract: 4 mm proximal right ureteral stone. No hydronephrosis. No renal or adrenal mass. Small cysts within the left kidney. Urinary bladder unremarkable. Stomach/Bowel: Scattered left colonic diverticulosis. No active diverticulitis. Normal appendix. Stomach and small bowel decompressed, unremarkable.  Vascular/Lymphatic: Aneurysmal dilatation of the infrarenal aorta measuring up to 3.1 cm. Extensive mural plaque within the aneurysm with areas of ulceration within the plaque. Diffuse aortoiliac atherosclerosis. No adenopathy. Reproductive: Uterus and adnexa unremarkable.  No mass. Other: No free fluid or free air. Musculoskeletal: No acute bony abnormality. IMPRESSION: Stranding/inflammation around the pancreatic body and tail compatible with acute pancreatitis. 2.2 cm cystic lesion within the pancreatic head could reflect pseudo cyst or other pancreatic cyst. This could be followed with repeat CT in 6 months after resolution of pancreatitis symptoms or further characterized with elective pancreatic MRI following resolution of pancreatitis. 3.1 cm infrarenal abdominal aortic aneurysm with ulcerative plaque. 4 mm proximal right ureteral stone.  No hydronephrosis. Electronically Signed   By: KRolm BaptiseM.D.   On: 09/10/2019 16:10   MM 3D SCREEN BREAST BILATERAL  Result Date: 08/21/2019 CLINICAL DATA:  Screening. EXAM: DIGITAL SCREENING BILATERAL MAMMOGRAM WITH TOMO AND CAD COMPARISON:  Previous exam(s). ACR Breast Density Category b: There are scattered areas of fibroglandular density. FINDINGS: There are no findings suspicious for malignancy. Images were processed with CAD. IMPRESSION: No mammographic evidence of malignancy. A result letter of this screening mammogram will be mailed directly to the patient. RECOMMENDATION: Screening mammogram in one year. (Code:SM-B-01Y) BI-RADS CATEGORY  1: Negative. Electronically Signed   By: DLajean ManesM.D.   On: 08/21/2019 15:01   CUP PACEART REMOTE  DEVICE CHECK  Result Date: 08/19/2019 [facility] summary report received. Battery status OK. Normal device function. No new symptom episodes, tachy episodes, brady, or pause episodes. No new AF episodes. Monthly summary reports and ROV/PRN      LAB RESULTS: Basic Metabolic Panel: Recent Labs  Lab  09/12/19 0536 09/13/19 0437  NA 135 139  K 3.7 3.8  CL 102 104  CO2 23 26  GLUCOSE 58* 82  BUN 8 <5*  CREATININE 0.52 0.52  CALCIUM 8.2* 8.6*   Liver Function Tests: Recent Labs  Lab 09/12/19 0536 09/13/19 0437  AST 16 18  ALT 12 13  ALKPHOS 44 51  BILITOT 0.6 1.1  PROT 5.2* 6.0*  ALBUMIN 2.4* 2.9*   Recent Labs  Lab 09/12/19 0536 09/13/19 0437  LIPASE 211* 204*   No results for input(s): AMMONIA in the last 168 hours. CBC: Recent Labs  Lab 09/10/19 1312 09/12/19 0536 09/13/19 0437  WBC 14.1* 8.5 9.1  NEUTROABS 11.5*  --   --   HGB 15.4* 12.4 13.3  HCT 47.2* 38.6 40.6  MCV 97.7 98.5 97.6  PLT 342 264 312   Cardiac Enzymes: No results for input(s): CKTOTAL, CKMB, CKMBINDEX, TROPONINI in the last 168 hours. BNP: Invalid input(s): POCBNP CBG: Recent Labs  Lab 09/13/19 0154 09/13/19 0751  GLUCAP 78 70      Disposition and Follow-up: Discharge Instructions    Diet - low sodium heart healthy   Complete by: As directed    Discharge instructions   Complete by: As directed    SOFT, fat free diet for next few days   Increase activity slowly   Complete by: As directed        DISPOSITION: [place], [place], DO. Schedule an appointment as soon as possible for a visit in 2 week(s).   Specialty: Gastroenterology Why: GI Contact information: [place] [address] [phone]        Howard Pouch A, DO. Schedule an appointment as soon as possible for a visit in 2 week(s).   Specialty: Family Medicine Contact information: [id number] [place] [address] [phone]        Constance Haw, MD .   Specialty: Cardiology Contact information: [address] [place] [address] [phone]        Satira Sark, MD .   Specialty: Cardiology Contact information: [place] [address] [phone]             Time coordinating discharge:  35 minutes  Signed:   Estill Cotta M.D. [facility] 09/13/2019, 11:59 AM

## 2019-09-13 NOTE — Telephone Encounter (Signed)
Pt scheduled to see Dr. Tracie Harrier 10/11/19@10 :40am. Appt letter mailed to pt.

## 2019-09-16 ENCOUNTER — Telehealth: Payer: Self-pay

## 2019-09-16 ENCOUNTER — Encounter: Payer: Self-pay | Admitting: Family Medicine

## 2019-09-16 ENCOUNTER — Ambulatory Visit (INDEPENDENT_AMBULATORY_CARE_PROVIDER_SITE_OTHER): Payer: BC Managed Care – PPO | Admitting: Family Medicine

## 2019-09-16 ENCOUNTER — Other Ambulatory Visit: Payer: Self-pay

## 2019-09-16 VITALS — BP 89/59 | HR 112 | Temp 98.5°F | Resp 17 | Ht 61.0 in | Wt 122.2 lb

## 2019-09-16 DIAGNOSIS — L509 Urticaria, unspecified: Secondary | ICD-10-CM

## 2019-09-16 LAB — CULTURE, BLOOD (ROUTINE X 2)
Culture: NO GROWTH
Culture: NO GROWTH
Special Requests: ADEQUATE
Special Requests: ADEQUATE

## 2019-09-16 MED ORDER — PREDNISONE 20 MG PO TABS: ORAL_TABLET | ORAL | 0 refills | Status: DC

## 2019-09-16 NOTE — Progress Notes (Signed)
This visit occurred during the SARS-CoV-2 public health emergency.  Safety protocols were in place, including screening questions prior to the visit, additional usage of staff PPE, and extensive cleaning of exam room while observing appropriate contact time as indicated for disinfecting solutions.    [name] , [birth year], 58 y.o., female MRN: [id number] Patient Care Team    Relationship Specialty Notifications Start End  [name], DO PCP - General Family Medicine  10/13/17   Constance Haw, MD PCP - Electrophysiology Cardiology Admissions 08/28/17   Satira Sark, MD PCP - Cardiology Cardiology Admissions 12/15/17   Rosalin Hawking, MD Consulting Physician Neurology  10/13/17   Jamse Arn, MD Consulting Physician Physical Medicine and Rehabilitation  10/13/17   Luanne Bras, MD Consulting Physician Interventional Radiology  10/13/17     Chief Complaint  Patient presents with  . Urticaria    Pt started swelling in face and has hives that started Saturday.   . Angioedema     Subjective: Pt presents for an OV with complaints of hives that started on Saturday, December 19.  Patient has a chronic history of urticaria with angioedema.  She has been taking daily up until Saturday Xyzal and hydroxyzine.  She stopped these medications secondary to upcoming appointment with her allergist.  She has an appointment with them tomorrow for allergy testing.  She reports on Saturday her face was swollen more than it is today.  The swelling has slowly improved since Sunday.  She does endorse feeling like her tongue is full, but denies any shortness of breath or throat involvement.  Depression screen [facility] 2/9 11/22/2018 10/22/2018 10/22/2018 10/19/2018 11/10/2017  Decreased Interest 0 0 0 0 0  Down, Depressed, Hopeless 0 0 0 0 0  PHQ - 2 Score 0 0 0 0 0  Altered sleeping - 0 - - 0  Tired, decreased energy - 1 - - 0  Change in appetite - 0 - - 0  Feeling bad or failure about  yourself  - 0 - - 0  Trouble concentrating - 0 - - 0  Moving slowly or fidgety/restless - 0 - - 0  Suicidal thoughts - 0 - - 0  PHQ-9 Score - 1 - - 0  Difficult doing work/chores - Not difficult at all - - Not difficult at all  Some recent data might be hidden    Allergies  Allergen Reactions  . Lopressor [Metoprolol Tartrate] Swelling    Angioedema  . Allegra Allergy [Fexofenadine Hcl] Hives  . Nsaids     On Xarelto  . Gabapentin (Once-Daily) Rash  . Lipitor [Atorvastatin] Diarrhea   Social History   Social History Narrative   Married. 2 children.    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    Walks with cane or wheelchair.    Former smoker.    Drinks caffeine.    Smoke alarm in the home.   Firearms in the home.    Wears her seatbelt.    Feels safe In her relationships.    Past Medical History:  Diagnosis Date  . Angio-edema   . Arthritis   . CAD (coronary artery disease)    a. s/p NSTEMI in 10/2017 with cath showing LM disease --> s/p CABG on 11/23/2017 with LIMA-LAD and SVG-OM.   [name] Hay fever   . Ischemic cardiomyopathy    a. EF 35-40% by echo in 10/2017  . NSTEMI (non-ST elevated myocardial infarction) ([place])   . PAF (paroxysmal atrial  fibrillation) ([place])    Diagnosed by ILR  . Pneumonia 11/16/2017  . Stroke [facility]) 08/23/2017   Left MCA infarct status post TPA and thrombectomy  . Urticaria    Past Surgical History:  Procedure Laterality Date  . CORONARY ARTERY BYPASS GRAFT N/A 11/23/2017   Procedure: CORONARY ARTERY BYPASS GRAFTING (CABG) x 2, ON PUMP, USING LEFT INTERNAL MAMMARY ARTERY AND RIGHT GREATER SAPHENOUS VEIN HARVESTED ENDOSCOPICALLY;  Surgeon: Gaye Pollack, MD;  Location: [place];  Service: Open Heart Surgery;  Laterality: N/A;  . CRYOABLATION     cervical  . IR ANGIO INTRA EXTRACRAN SEL COM CAROTID INNOMINATE UNI R MOD SED  08/21/2017  . IR ANGIO VERTEBRAL SEL SUBCLAVIAN INNOMINATE UNI R MOD SED  08/21/2017  . IR PERCUTANEOUS ART THROMBECTOMY/INFUSION  INTRACRANIAL INC DIAG ANGIO  08/21/2017  . IR RADIOLOGIST EVAL & MGMT  10/30/2017  . LOOP RECORDER INSERTION N/A 08/28/2017   Procedure: LOOP RECORDER INSERTION;  Surgeon: Constance Haw, MD;  Location: [place] [facility];  Service: Cardiovascular;  Laterality: N/A;  . RADIOLOGY WITH ANESTHESIA N/A 08/21/2017   Procedure: RADIOLOGY WITH ANESTHESIA;  Surgeon: Luanne Bras, MD;  Location: [place];  Service: Radiology;  Laterality: N/A;  . RIGHT/LEFT HEART CATH AND CORONARY ANGIOGRAPHY N/A 11/20/2017   Procedure: RIGHT/LEFT HEART CATH AND CORONARY ANGIOGRAPHY;  Surgeon: Jettie Booze, MD;  Location: [place] [facility];  Service: Cardiovascular;  Laterality: N/A;  . TEE WITHOUT CARDIOVERSION N/A 08/28/2017   Procedure: TRANSESOPHAGEAL ECHOCARDIOGRAM (TEE);  Surgeon: Fay Records, MD;  Location: [place];  Service: Cardiovascular;  Laterality: N/A;  . TEE WITHOUT CARDIOVERSION N/A 11/23/2017   Procedure: TRANSESOPHAGEAL ECHOCARDIOGRAM (TEE);  Surgeon: Gaye Pollack, MD;  Location: [place];  Service: Open Heart Surgery;  Laterality: N/A;  . TUBAL LIGATION     Family History  Problem Relation Age of Onset  . Heart attack Father   . Hypertension Mother   . Hyperlipidemia Mother   . Diabetes type II Mother   . Urticaria Mother   . Hypertension Brother   . Breast cancer Cousin 86  . Immunodeficiency Neg Hx   . Eczema Neg Hx   . Atopy Neg Hx   . Asthma Neg Hx   . Angioedema Neg Hx   . Allergic rhinitis Neg Hx    Allergies as of 09/16/2019      Reactions   Lopressor [metoprolol Tartrate] Swelling   Angioedema   Allegra Allergy [fexofenadine Hcl] Hives   Nsaids    On Xarelto   Gabapentin (once-daily) Rash   Lipitor [atorvastatin] Diarrhea      Medication List       Accurate as of September 16, 2019 11:18 AM. If you have any questions, ask your nurse or doctor.        acetaminophen 325 MG tablet Commonly known as: TYLENOL Take 2 tablets (650 mg total) by mouth every  6 (six) hours as needed for mild pain or moderate pain (or Fever >/= 101).   Azelastine-Fluticasone 137-50 MCG/ACT Susp Place 1 spray into both nostrils 2 (two) times daily as needed.   baclofen 10 MG tablet Commonly known as: LIORESAL TAKE 1 TABLET BY MOUTH THREE TIMES DAILY   calcium carbonate 500 MG chewable tablet Commonly known as: TUMS - dosed in mg elemental calcium Chew 1 tablet by mouth 2 (two) times daily.   carvedilol 3.125 MG tablet Commonly known as: COREG Take 1 tablet by mouth twice daily   cholecalciferol 1000 units  tablet Commonly known as: VITAMIN D Take 1,000 Units by mouth daily.   Entresto 24-26 MG Generic drug: sacubitril-valsartan Take 1 tablet by mouth 2 (two) times daily.   ferrous sulfate 325 (65 FE) MG tablet Take 325 mg by mouth daily with breakfast. Taking twice weekly   furosemide 20 MG tablet Commonly known as: LASIX Take 0.5 tablets (10 mg total) by mouth daily.   hydrOXYzine 10 MG tablet Commonly known as: ATARAX/VISTARIL Take 1 tablet (10 mg total) by mouth at bedtime.   levocetirizine 5 MG tablet Commonly known as: Xyzal Take 0.5 tablets (2.5 mg total) by mouth every evening.   loratadine 10 MG tablet Commonly known as: CLARITIN Take 1 tablet (10 mg total) by mouth daily.   ondansetron 4 MG disintegrating tablet Commonly known as: Zofran ODT Take 1 tablet (4 mg total) by mouth every 8 (eight) hours as needed for nausea or vomiting.   Potassium Chloride ER 20 MEQ Tbcr Take 1 tablet by mouth daily.   triamcinolone cream 0.1 % Commonly known as: KENALOG Apply 1 application topically 2 (two) times daily. What changed:   when to take this  reasons to take this   vitamin B-12 100 MCG tablet Commonly known as: CYANOCOBALAMIN Take 100 mcg by mouth daily.   Xarelto 20 MG Tabs tablet Generic drug: rivaroxaban TAKE 1 TABLET BY MOUTH ONCE DAILY WITH SUPPER What changed: See the new instructions.       All past medical  history, surgical history, allergies, family history, immunizations andmedications were updated in the EMR today and reviewed under the history and medication portions of their EMR.     ROS: Negative, with the exception of above mentioned in HPI   Objective:  BP (!) 89/59 (BP Location: Right Arm, Patient Position: Sitting, Cuff Size: Normal)   Pulse (!) 132   Temp 98.5 F (36.9 C) (Temporal)   Resp 17   Ht 5\' 1"  (1.549 m)   Wt 122 lb 3 oz (55.4 kg)   LMP  (LMP Unknown)   SpO2 97%   BMI 23.09 kg/m  Body mass index is 23.09 kg/m. Gen: Afebrile. No acute distress. Nontoxic in appearance, well developed, well nourished.  HENT: AT. [place].  MMM, no oral lesions.  Throat without erythema or exudates.  Mild swelling of her left zygomatic arch, left mandible region, right periorbital region.  No cough or shortness of breath.  Swallowing mechanism intact. Eyes:Pupils Equal Round Reactive to light, Extraocular movements intact,  Conjunctiva without redness, discharge or icterus.  CV: Mildly tachycardic Chest: CTAB, no wheeze or crackles. Good air movement, normal resp effort.    No exam data present No results found. No results found for this or any previous visit (from the past 24 hour(s)).  Assessment/Plan: [name] is a 58 y.o. female present for OV for  Hives On exam today patient does have mild facial swelling.  Her vital signs are stable and she is breathing without difficulty.  There is no throat involvement.  She states her tongue has felt full but does not appear enlarged or swollen today. She recently stopped all of her antihistamine secondary to allergy testing coming up tomorrow, which is the likely cause of the return of her hives-which are also located all over her trunk. Since the condition has been stable since Saturday, not worsening, and mildly improving each day along with stable vitals I believe she can wait to restart her antihistamines until after her allergy  testing tomorrow.  We also discussed at length today that if she has any worsening symptoms, she is to restart her antihistamines, start the steroid pack called in today and if shortness of breath occurs she is to go to the emergency room.  Patient reports understanding.  Reviewed expectations re: course of current medical issues.  Discussed self-management of symptoms.  Outlined signs and symptoms indicating need for more acute intervention.  Patient verbalized understanding and all questions were answered.  Patient received an After-Visit Summary.    No orders of the defined types were placed in this encounter.  > 25 minutes spent with patient, >50% of time spent face to face     Note is dictated utilizing voice recognition software. Although note has been proof read prior to signing, occasional typographical errors still can be missed. If any questions arise, please do not hesitate to call for verification.   electronically signed by:  Howard Pouch, DO  [place]

## 2019-09-16 NOTE — Patient Instructions (Addendum)
Since you are currently stable and seem to be improving daily>>>Hold off if able on the antihistamines and steroid>> but if you must start tonight.  I  Did call a steroid pack for you, again try to wait until tomorrow after allergy appt if able.   If any worsening symptoms take antihistamines and or go to ED if emergent symptoms.

## 2019-09-16 NOTE — Telephone Encounter (Signed)
Pt was called and said the left side of her face is swollen but tongue is not that swollen. It is more the hives and rash that are bothering the patient. Pt has this before and states she took a steroid dose pack and it got rid of them. Placed on schedule at 1115.

## 2019-09-16 NOTE — Telephone Encounter (Signed)
Pt is having no trouble breathing, swallowing, or throat swelling. She was advised if she was having those symptoms to call 911 and seek immediate medical help. She verbalized understanding

## 2019-09-16 NOTE — Telephone Encounter (Signed)
Patient has hives, face swollen from left cheek to lips to tongue. Patient states she has angioedema. Patient would like an appointment.  Patient also mentioned she has an appointment with Dr. Verlin Fester tomorrow.

## 2019-09-16 NOTE — Telephone Encounter (Signed)
Please advise if she needs to go to ED

## 2019-09-16 NOTE — Telephone Encounter (Signed)
If pt is is having swelling/angioedema involving the mouth she requires immediate care.  If she is having any throat involvement or  breathing difficulties she needs to go to ED.  The only treatment we can offer her is to see her at 1115 if she is NOT having throat/breathing difficulties and give her a steroid shot.

## 2019-09-17 ENCOUNTER — Ambulatory Visit: Payer: BC Managed Care – PPO | Admitting: Allergy and Immunology

## 2019-09-17 ENCOUNTER — Encounter: Payer: Self-pay | Admitting: Allergy and Immunology

## 2019-09-17 VITALS — BP 100/60 | HR 101 | Temp 97.8°F | Resp 18

## 2019-09-17 DIAGNOSIS — J310 Chronic rhinitis: Secondary | ICD-10-CM

## 2019-09-17 DIAGNOSIS — K2970 Gastritis, unspecified, without bleeding: Secondary | ICD-10-CM | POA: Diagnosis not present

## 2019-09-17 DIAGNOSIS — T783XXD Angioneurotic edema, subsequent encounter: Secondary | ICD-10-CM

## 2019-09-17 DIAGNOSIS — L500 Allergic urticaria: Secondary | ICD-10-CM

## 2019-09-17 MED ORDER — LEVOCETIRIZINE DIHYDROCHLORIDE 5 MG PO TABS: 5.0000 mg | ORAL_TABLET | Freq: Every evening | ORAL | 5 refills | Status: DC

## 2019-09-17 MED ORDER — AZELASTINE HCL 0.1 % NA SOLN: 1.0000 | Freq: Two times a day (BID) | NASAL | 5 refills | Status: DC | PRN

## 2019-09-17 NOTE — Patient Instructions (Addendum)
Recurrent urticaria Unclear etiology. Skin tests to select food and environmental allergens were negative today. NSAIDs and emotional stress commonly exacerbate urticaria but are not the underlying etiology in this case. Physical urticarias are negative by history (i.e. pressure-induced, temperature, vibration, solar, etc.). There are no concomitant symptoms concerning for anaphylaxis. We will rule out other potential etiologies with labs. For symptom relief, patient is to take oral antihistamines as directed. The following labs have been ordered: FCeRI antibody, anti-thyroglobulin antibody, thyroid peroxidase antibody, tryptase, H. pylori serology, CBC, CMP, ESR, ANA, and alpha-gal panel. The patient will be called with further recommendations after lab results have returned.  Instructions have been discussed and provided for H1/H2 receptor blockade with titration to find lowest effective dose.  A prescription has been provided for levocetirizine (Xyzal), 5 mg daily as needed.  A prescription has been provided for famotidine (Pepcid), 93m twice daily as needed.  Should there be a significant increase or change in symptoms, a journal is to be kept recording any foods eaten, beverages consumed, medications taken within a 6 hour period prior to the onset of symptoms, as well as record activities being performed, and environmental conditions. For any symptoms concerning for anaphylaxis, 911 is to be called immediately.  Angioedema Associated angioedema occurs in up to 50% of patients with recurrent urticaria.  Treatment/diagnostic plan as outlined above.  Chronic rhinitis All seasonal and perennial aeroallergens were negative today despite a positive histamine control.  A prescription has been provided for azelastine nasal spray, 1-2 sprays per nostril 2 times daily as needed. Proper nasal spray technique has been discussed and demonstrated.   Nasal saline spray (i.e., Simply Saline) or nasal  saline lavage (i.e., NeilMed) is recommended as needed and prior to medicated nasal sprays.   When lab results have returned you will be called with further recommendations. With the newly implemented Cures Act, the labs may be visible to you at the same time they become visible to [place] However, the results will typically not be addressed until all of the results are back, so please be patient.  Until you have heard from [place] please continue the treatment plan as outlined on your take home sheet.  Urticaria (Hives)  . Levocetirizine (Xyzal) 5 mg twice a day and famotidine (Pepcid) 20 mg twice a day. If no symptoms for 7-14 days then decrease to. . Levocetirizine (Xyzal) 5 mg twice a day and famotidine (Pepcid) 20 mg once a day.  If no symptoms for 7-14 days then decrease to. . Levocetirizine (Xyzal) 5 mg twice a day.  If no symptoms for 7-14 days then decrease to. . Levocetirizine (Xyzal) 5 mg once a day.  May use Benadryl (diphenhydramine) as needed for breakthrough symptoms       If symptoms return, then step up dosage

## 2019-09-17 NOTE — Assessment & Plan Note (Signed)
Unclear etiology. Skin tests to select food and environmental allergens were negative today. NSAIDs and emotional stress commonly exacerbate urticaria but are not the underlying etiology in this case. Physical urticarias are negative by history (i.e. pressure-induced, temperature, vibration, solar, etc.). There are no concomitant symptoms concerning for anaphylaxis. We will rule out other potential etiologies with labs. For symptom relief, patient is to take oral antihistamines as directed. The following labs have been ordered: FCeRI antibody, anti-thyroglobulin antibody, thyroid peroxidase antibody, tryptase, H. pylori serology, CBC, CMP, ESR, ANA, and alpha-gal panel. The patient will be called with further recommendations after lab results have returned.  Instructions have been discussed and provided for H1/H2 receptor blockade with titration to find lowest effective dose.  A prescription has been provided for levocetirizine (Xyzal), 5 mg daily as needed.  A prescription has been provided for famotidine (Pepcid), 31m twice daily as needed.  Should there be a significant increase or change in symptoms, a journal is to be kept recording any foods eaten, beverages consumed, medications taken within a 6 hour period prior to the onset of symptoms, as well as record activities being performed, and environmental conditions. For any symptoms concerning for anaphylaxis, 911 is to be called immediately.

## 2019-09-17 NOTE — Assessment & Plan Note (Signed)
All seasonal and perennial aeroallergens were negative today despite a positive histamine control.  A prescription has been provided for azelastine nasal spray, 1-2 sprays per nostril 2 times daily as needed. Proper nasal spray technique has been discussed and demonstrated.   Nasal saline spray (i.e., Simply Saline) or nasal saline lavage (i.e., NeilMed) is recommended as needed and prior to medicated nasal sprays.

## 2019-09-17 NOTE — Progress Notes (Signed)
  Follow-up Note  RE: Indra G Vespa MRN: [id number] DOB: 04/30/1961 Date of Office Visit: 09/17/2019  Primary care provider: [name], [name], DO Referring provider: [name], [name], DO  History of present illness: [name] is a 58 y.o. female with recurrent urticaria, angioedema, and rhinitis presenting today for follow-up and allergy skin testing.  We were unable to perform skin testing during her initial visit because of prolonged prednisone course.  She reports that over the past 3 days while off of antihistamines she has experienced generalized urticaria, particularly over her legs, buttocks, and torso, as well as mild angioedema along the jawline bilaterally.  She has not experienced cardiopulmonary or GI symptoms.  Urticaria and angioedema had been controlled while taking levocetirizine 5 mg daily and loratadine 10 mg daily. She has no new nasal symptom complaints today.  Assessment and plan: Recurrent urticaria Unclear etiology. Skin tests to select food and environmental allergens were negative today. NSAIDs and emotional stress commonly exacerbate urticaria but are not the underlying etiology in this case. Physical urticarias are negative by history (i.e. pressure-induced, temperature, vibration, solar, etc.). There are no concomitant symptoms concerning for anaphylaxis. We will rule out other potential etiologies with labs. For symptom relief, patient is to take oral antihistamines as directed. The following labs have been ordered: FCeRI antibody, anti-thyroglobulin antibody, thyroid peroxidase antibody, tryptase, H. pylori serology, CBC, CMP, ESR, ANA, and alpha-gal panel. The patient will be called with further recommendations after lab results have returned.  Instructions have been discussed and provided for H1/H2 receptor blockade with titration to find lowest effective dose.  A prescription has been provided for levocetirizine (Xyzal), 5 mg daily as needed.  A  prescription has been provided for famotidine (Pepcid), 20mg twice daily as needed.  Should there be a significant increase or change in symptoms, a journal is to be kept recording any foods eaten, beverages consumed, medications taken within a 6 hour period prior to the onset of symptoms, as well as record activities being performed, and environmental conditions. For any symptoms concerning for anaphylaxis, 911 is to be called immediately.  Angioedema Associated angioedema occurs in up to 50% of patients with recurrent urticaria.  Treatment/diagnostic plan as outlined above.  Chronic rhinitis All seasonal and perennial aeroallergens were negative today despite a positive histamine control.  A prescription has been provided for azelastine nasal spray, 1-2 sprays per nostril 2 times daily as needed. Proper nasal spray technique has been discussed and demonstrated.   Nasal saline spray (i.e., Simply Saline) or nasal saline lavage (i.e., NeilMed) is recommended as needed and prior to medicated nasal sprays.   Meds ordered this encounter  Medications  . levocetirizine (XYZAL) 5 MG tablet    Sig: Take 1 tablet (5 mg total) by mouth every evening.    Dispense:  60 tablet    Refill:  5  . azelastine (ASTELIN) 0.1 % nasal spray    Sig: Place 1-2 sprays into both nostrils 2 (two) times daily as needed for rhinitis. Use in each nostril as directed    Dispense:  30 mL    Refill:  5    Diagnostics: Environmental skin testing: Negative despite a positive histamine control. Food allergen skin testing: Negative despite a positive histamine control.    Physical examination: Blood pressure 100/60, pulse (!) 101, temperature 97.8 F (36.6 C), temperature source Temporal, resp. rate 18, SpO2 96 %.  General: Alert, interactive, in no acute distress. HEENT: TMs pearly gray, turbinates moderately edematous without     Follow-up Note  RE: Indra G Vespa MRN: [id number] DOB: 04/30/1961 Date of Office Visit: 09/17/2019  Primary care provider: [name], [name], DO Referring provider: [name], [name], DO  History of present illness: [name] is a 58 y.o. female with recurrent urticaria, angioedema, and rhinitis presenting today for follow-up and allergy skin testing.  We were unable to perform skin testing during her initial visit because of prolonged prednisone course.  She reports that over the past 3 days while off of antihistamines she has experienced generalized urticaria, particularly over her legs, buttocks, and torso, as well as mild angioedema along the jawline bilaterally.  She has not experienced cardiopulmonary or GI symptoms.  Urticaria and angioedema had been controlled while taking levocetirizine 5 mg daily and loratadine 10 mg daily. She has no new nasal symptom complaints today.  Assessment and plan: Recurrent urticaria Unclear etiology. Skin tests to select food and environmental allergens were negative today. NSAIDs and emotional stress commonly exacerbate urticaria but are not the underlying etiology in this case. Physical urticarias are negative by history (i.e. pressure-induced, temperature, vibration, solar, etc.). There are no concomitant symptoms concerning for anaphylaxis. We will rule out other potential etiologies with labs. For symptom relief, patient is to take oral antihistamines as directed. The following labs have been ordered: FCeRI antibody, anti-thyroglobulin antibody, thyroid peroxidase antibody, tryptase, H. pylori serology, CBC, CMP, ESR, ANA, and alpha-gal panel. The patient will be called with further recommendations after lab results have returned.  Instructions have been discussed and provided for H1/H2 receptor blockade with titration to find lowest effective dose.  A prescription has been provided for levocetirizine (Xyzal), 5 mg daily as needed.  A  prescription has been provided for famotidine (Pepcid), 20mg twice daily as needed.  Should there be a significant increase or change in symptoms, a journal is to be kept recording any foods eaten, beverages consumed, medications taken within a 6 hour period prior to the onset of symptoms, as well as record activities being performed, and environmental conditions. For any symptoms concerning for anaphylaxis, 911 is to be called immediately.  Angioedema Associated angioedema occurs in up to 50% of patients with recurrent urticaria.  Treatment/diagnostic plan as outlined above.  Chronic rhinitis All seasonal and perennial aeroallergens were negative today despite a positive histamine control.  A prescription has been provided for azelastine nasal spray, 1-2 sprays per nostril 2 times daily as needed. Proper nasal spray technique has been discussed and demonstrated.   Nasal saline spray (i.e., Simply Saline) or nasal saline lavage (i.e., NeilMed) is recommended as needed and prior to medicated nasal sprays.   Meds ordered this encounter  Medications  . levocetirizine (XYZAL) 5 MG tablet    Sig: Take 1 tablet (5 mg total) by mouth every evening.    Dispense:  60 tablet    Refill:  5  . azelastine (ASTELIN) 0.1 % nasal spray    Sig: Place 1-2 sprays into both nostrils 2 (two) times daily as needed for rhinitis. Use in each nostril as directed    Dispense:  30 mL    Refill:  5    Diagnostics: Environmental skin testing: Negative despite a positive histamine control. Food allergen skin testing: Negative despite a positive histamine control.    Physical examination: Blood pressure 100/60, pulse (!) 101, temperature 97.8 F (36.6 C), temperature source Temporal, resp. rate 18, SpO2 96 %.  General: Alert, interactive, in no acute distress. HEENT: TMs pearly gray, turbinates moderately edematous without   Follow-up Note  RE: TANAIRI CYPERT MRN: [id number] DOB: 02/25/1961 Date of Office Visit: 09/17/2019  Primary care provider: [name], DO Referring provider: [name], DO  History of present illness: [name] is a 58 y.o. female with recurrent urticaria, angioedema, and rhinitis presenting today for follow-up and allergy skin testing.  We were unable to perform skin testing during her initial visit because of prolonged prednisone course.  She reports that over the past 3 days while off of antihistamines she has experienced generalized urticaria, particularly over her legs, buttocks, and torso, as well as mild angioedema along the jawline bilaterally.  She has not experienced cardiopulmonary or GI symptoms.  Urticaria and angioedema had been controlled while taking levocetirizine 5 mg daily and loratadine 10 mg daily. She has no new nasal symptom complaints today.  Assessment and plan: Recurrent urticaria Unclear etiology. Skin tests to select food and environmental allergens were negative today. NSAIDs and emotional stress commonly exacerbate urticaria but are not the underlying etiology in this case. Physical urticarias are negative by history (i.e. pressure-induced, temperature, vibration, solar, etc.). There are no concomitant symptoms concerning for anaphylaxis. We will rule out other potential etiologies with labs. For symptom relief, patient is to take oral antihistamines as directed. The following labs have been ordered: FCeRI antibody, anti-thyroglobulin antibody, thyroid peroxidase antibody, tryptase, H. pylori serology, CBC, CMP, ESR, ANA, and alpha-gal panel. The patient will be called with further recommendations after lab results have returned.  Instructions have been discussed and provided for H1/H2 receptor blockade with titration to find lowest effective dose.  A prescription has been provided for levocetirizine (Xyzal), 5 mg daily as needed.  A  prescription has been provided for famotidine (Pepcid), 55m twice daily as needed.  Should there be a significant increase or change in symptoms, a journal is to be kept recording any foods eaten, beverages consumed, medications taken within a 6 hour period prior to the onset of symptoms, as well as record activities being performed, and environmental conditions. For any symptoms concerning for anaphylaxis, 911 is to be called immediately.  Angioedema Associated angioedema occurs in up to 50% of patients with recurrent urticaria.  Treatment/diagnostic plan as outlined above.  Chronic rhinitis All seasonal and perennial aeroallergens were negative today despite a positive histamine control.  A prescription has been provided for azelastine nasal spray, 1-2 sprays per nostril 2 times daily as needed. Proper nasal spray technique has been discussed and demonstrated.   Nasal saline spray (i.e., Simply Saline) or nasal saline lavage (i.e., NeilMed) is recommended as needed and prior to medicated nasal sprays.   Meds ordered this encounter  Medications  . levocetirizine (XYZAL) 5 MG tablet    Sig: Take 1 tablet (5 mg total) by mouth every evening.    Dispense:  60 tablet    Refill:  5  . azelastine (ASTELIN) 0.1 % nasal spray    Sig: Place 1-2 sprays into both nostrils 2 (two) times daily as needed for rhinitis. Use in each nostril as directed    Dispense:  30 mL    Refill:  5    Diagnostics: Environmental skin testing: Negative despite a positive histamine control. Food allergen skin testing: Negative despite a positive histamine control.    Physical examination: Blood pressure 100/60, pulse (!) 101, temperature 97.8 F (36.6 C), temperature source Temporal, resp. rate 18, SpO2 96 %.  General: Alert, interactive, in no acute distress. HEENT: TMs pearly gray, turbinates moderately edematous without    Follow-up Note  RE: Indra G Vespa MRN: [id number] DOB: 04/30/1961 Date of Office Visit: 09/17/2019  Primary care provider: [name], [name], DO Referring provider: [name], [name], DO  History of present illness: [name] is a 58 y.o. female with recurrent urticaria, angioedema, and rhinitis presenting today for follow-up and allergy skin testing.  We were unable to perform skin testing during her initial visit because of prolonged prednisone course.  She reports that over the past 3 days while off of antihistamines she has experienced generalized urticaria, particularly over her legs, buttocks, and torso, as well as mild angioedema along the jawline bilaterally.  She has not experienced cardiopulmonary or GI symptoms.  Urticaria and angioedema had been controlled while taking levocetirizine 5 mg daily and loratadine 10 mg daily. She has no new nasal symptom complaints today.  Assessment and plan: Recurrent urticaria Unclear etiology. Skin tests to select food and environmental allergens were negative today. NSAIDs and emotional stress commonly exacerbate urticaria but are not the underlying etiology in this case. Physical urticarias are negative by history (i.e. pressure-induced, temperature, vibration, solar, etc.). There are no concomitant symptoms concerning for anaphylaxis. We will rule out other potential etiologies with labs. For symptom relief, patient is to take oral antihistamines as directed. The following labs have been ordered: FCeRI antibody, anti-thyroglobulin antibody, thyroid peroxidase antibody, tryptase, H. pylori serology, CBC, CMP, ESR, ANA, and alpha-gal panel. The patient will be called with further recommendations after lab results have returned.  Instructions have been discussed and provided for H1/H2 receptor blockade with titration to find lowest effective dose.  A prescription has been provided for levocetirizine (Xyzal), 5 mg daily as needed.  A  prescription has been provided for famotidine (Pepcid), 20mg twice daily as needed.  Should there be a significant increase or change in symptoms, a journal is to be kept recording any foods eaten, beverages consumed, medications taken within a 6 hour period prior to the onset of symptoms, as well as record activities being performed, and environmental conditions. For any symptoms concerning for anaphylaxis, 911 is to be called immediately.  Angioedema Associated angioedema occurs in up to 50% of patients with recurrent urticaria.  Treatment/diagnostic plan as outlined above.  Chronic rhinitis All seasonal and perennial aeroallergens were negative today despite a positive histamine control.  A prescription has been provided for azelastine nasal spray, 1-2 sprays per nostril 2 times daily as needed. Proper nasal spray technique has been discussed and demonstrated.   Nasal saline spray (i.e., Simply Saline) or nasal saline lavage (i.e., NeilMed) is recommended as needed and prior to medicated nasal sprays.   Meds ordered this encounter  Medications  . levocetirizine (XYZAL) 5 MG tablet    Sig: Take 1 tablet (5 mg total) by mouth every evening.    Dispense:  60 tablet    Refill:  5  . azelastine (ASTELIN) 0.1 % nasal spray    Sig: Place 1-2 sprays into both nostrils 2 (two) times daily as needed for rhinitis. Use in each nostril as directed    Dispense:  30 mL    Refill:  5    Diagnostics: Environmental skin testing: Negative despite a positive histamine control. Food allergen skin testing: Negative despite a positive histamine control.    Physical examination: Blood pressure 100/60, pulse (!) 101, temperature 97.8 F (36.6 C), temperature source Temporal, resp. rate 18, SpO2 96 %.  General: Alert, interactive, in no acute distress. HEENT: TMs pearly gray, turbinates moderately edematous without

## 2019-09-17 NOTE — Assessment & Plan Note (Signed)
Associated angioedema occurs in up to 50% of patients with recurrent urticaria.  Treatment/diagnostic plan as outlined above.

## 2019-09-18 ENCOUNTER — Ambulatory Visit (INDEPENDENT_AMBULATORY_CARE_PROVIDER_SITE_OTHER): Payer: BC Managed Care – PPO | Admitting: Family Medicine

## 2019-09-18 ENCOUNTER — Other Ambulatory Visit: Payer: Self-pay

## 2019-09-18 ENCOUNTER — Encounter: Payer: Self-pay | Admitting: Family Medicine

## 2019-09-18 VITALS — BP 105/72 | HR 97 | Temp 98.4°F | Resp 16 | Ht 61.0 in | Wt 120.6 lb

## 2019-09-18 DIAGNOSIS — N3001 Acute cystitis with hematuria: Secondary | ICD-10-CM

## 2019-09-18 DIAGNOSIS — R300 Dysuria: Secondary | ICD-10-CM

## 2019-09-18 LAB — POCT URINALYSIS DIPSTICK
Bilirubin, UA: NEGATIVE
Glucose, UA: NEGATIVE
Ketones, UA: NEGATIVE
Leukocytes, UA: NEGATIVE
Nitrite, UA: NEGATIVE
Protein, UA: NEGATIVE
Spec Grav, UA: 1.01 (ref 1.010–1.025)
Urobilinogen, UA: NEGATIVE E.U./dL — AB
pH, UA: 7 (ref 5.0–8.0)

## 2019-09-18 MED ORDER — SULFAMETHOXAZOLE-TRIMETHOPRIM 800-160 MG PO TABS: 1.0000 | ORAL_TABLET | Freq: Two times a day (BID) | ORAL | 0 refills | Status: AC

## 2019-09-18 NOTE — Progress Notes (Signed)
OFFICE VISIT  09/18/2019   CC:  Chief Complaint  Patient presents with  . Burning, frequency    started this morning, minor blood in urine   HPI:    Patient is a 58 y.o. Caucasian female who presents for urinary complaints. Onset today, some burning with urination, a little blood in urine, urinary urgency.   Not much coming out.  No fever or nausea.  Past Medical History:  Diagnosis Date  . Angio-edema   . Arthritis   . CAD (coronary artery disease)    a. s/p NSTEMI in 10/2017 with cath showing LM disease --> s/p CABG on 11/23/2017 with LIMA-LAD and SVG-OM.   [name] Hay fever   . Ischemic cardiomyopathy    a. EF 35-40% by echo in 10/2017  . NSTEMI (non-ST elevated myocardial infarction) ([place])   . PAF (paroxysmal atrial fibrillation) ([place])    Diagnosed by ILR  . Pneumonia 11/16/2017  . Stroke [facility]) 08/23/2017   Left MCA infarct status post TPA and thrombectomy  . Urticaria     Past Surgical History:  Procedure Laterality Date  . CORONARY ARTERY BYPASS GRAFT N/A 11/23/2017   Procedure: CORONARY ARTERY BYPASS GRAFTING (CABG) x 2, ON PUMP, USING LEFT INTERNAL MAMMARY ARTERY AND RIGHT GREATER SAPHENOUS VEIN HARVESTED ENDOSCOPICALLY;  Surgeon: Gaye Pollack, MD;  Location: [place];  Service: Open Heart Surgery;  Laterality: N/A;  . CRYOABLATION     cervical  . IR ANGIO INTRA EXTRACRAN SEL COM CAROTID INNOMINATE UNI R MOD SED  08/21/2017  . IR ANGIO VERTEBRAL SEL SUBCLAVIAN INNOMINATE UNI R MOD SED  08/21/2017  . IR PERCUTANEOUS ART THROMBECTOMY/INFUSION INTRACRANIAL INC DIAG ANGIO  08/21/2017  . IR RADIOLOGIST EVAL & MGMT  10/30/2017  . LOOP RECORDER INSERTION N/A 08/28/2017   Procedure: LOOP RECORDER INSERTION;  Surgeon: Constance Haw, MD;  Location: [place] CV LAB;  Service: Cardiovascular;  Laterality: N/A;  . RADIOLOGY WITH ANESTHESIA N/A 08/21/2017   Procedure: RADIOLOGY WITH ANESTHESIA;  Surgeon: Luanne Bras, MD;  Location: [place];  Service: Radiology;   Laterality: N/A;  . RIGHT/LEFT HEART CATH AND CORONARY ANGIOGRAPHY N/A 11/20/2017   Procedure: RIGHT/LEFT HEART CATH AND CORONARY ANGIOGRAPHY;  Surgeon: Jettie Booze, MD;  Location: [place] CV LAB;  Service: Cardiovascular;  Laterality: N/A;  . TEE WITHOUT CARDIOVERSION N/A 08/28/2017   Procedure: TRANSESOPHAGEAL ECHOCARDIOGRAM (TEE);  Surgeon: Fay Records, MD;  Location: [place];  Service: Cardiovascular;  Laterality: N/A;  . TEE WITHOUT CARDIOVERSION N/A 11/23/2017   Procedure: TRANSESOPHAGEAL ECHOCARDIOGRAM (TEE);  Surgeon: Gaye Pollack, MD;  Location: [place] [facility];  Service: Open Heart Surgery;  Laterality: N/A;  . TUBAL LIGATION      Outpatient Medications Prior to Visit  Medication Sig Dispense Refill  . acetaminophen (TYLENOL) 325 MG tablet Take 2 tablets (650 mg total) by mouth every 6 (six) hours as needed for mild pain or moderate pain (or Fever >/= 101).    [name] azelastine (ASTELIN) 0.1 % nasal spray Place 1-2 sprays into both nostrils 2 (two) times daily as needed for rhinitis. Use in each nostril as directed 30 mL 5  . Azelastine-Fluticasone 137-50 MCG/ACT SUSP Place 1 spray into both nostrils 2 (two) times daily as needed. 23 g 5  . baclofen (LIORESAL) 10 MG tablet TAKE 1 TABLET BY MOUTH THREE TIMES DAILY (Patient taking differently: Take 10 mg by mouth 3 (three) times daily. ) 90 tablet 1  . calcium carbonate (TUMS - DOSED IN MG ELEMENTAL CALCIUM)  500 MG chewable tablet Chew 1 tablet by mouth 2 (two) times daily.     . carvedilol (COREG) 3.125 MG tablet Take 1 tablet by mouth twice daily 180 tablet 1  . cholecalciferol (VITAMIN D) 1000 units tablet Take 1,000 Units by mouth daily.    . ferrous sulfate 325 (65 FE) MG tablet Take 325 mg by mouth daily with breakfast. Taking twice weekly    . furosemide (LASIX) 20 MG tablet Take 0.5 tablets (10 mg total) by mouth daily. 45 tablet 1  . hydrOXYzine (ATARAX/VISTARIL) 10 MG tablet Take 1 tablet (10 mg total) by mouth at bedtime.  90 tablet 1  . levocetirizine (XYZAL) 5 MG tablet Take 0.5 tablets (2.5 mg total) by mouth every evening. 45 tablet 3  . levocetirizine (XYZAL) 5 MG tablet Take 1 tablet (5 mg total) by mouth every evening. 60 tablet 5  . loratadine (CLARITIN) 10 MG tablet Take 1 tablet (10 mg total) by mouth daily. 30 tablet 11  . ondansetron (ZOFRAN ODT) 4 MG disintegrating tablet Take 1 tablet (4 mg total) by mouth every 8 (eight) hours as needed for nausea or vomiting. 30 tablet 0  . Potassium Chloride ER 20 MEQ TBCR Take 1 tablet by mouth daily. 30 tablet 11  . predniSONE (DELTASONE) 20 MG tablet 60 mg x3d, 40 mg x3d, 20 mg x2d, 10 mg x2d 21 tablet 0  . sacubitril-valsartan (ENTRESTO) 24-26 MG Take 1 tablet by mouth 2 (two) times daily. 60 tablet 3  . triamcinolone cream (KENALOG) 0.1 % Apply 1 application topically 2 (two) times daily. (Patient taking differently: Apply 1 application topically 2 (two) times daily as needed (rash). ) 30 g 2  . vitamin B-12 (CYANOCOBALAMIN) 100 MCG tablet Take 100 mcg by mouth daily.    Alveda Reasons 20 MG TABS tablet TAKE 1 TABLET BY MOUTH ONCE DAILY WITH SUPPER (Patient taking differently: Take 20 mg by mouth daily with supper. Crushed Xarelto can be given down a G-tube but NOT a J-Tube.) 90 tablet 1   No facility-administered medications prior to visit.    Allergies  Allergen Reactions  . Lopressor [Metoprolol Tartrate] Swelling    Angioedema  . Allegra Allergy [Fexofenadine Hcl] Hives  . Nsaids     On Xarelto  . Gabapentin (Once-Daily) Rash  . Lipitor [Atorvastatin] Diarrhea    ROS As per HPI  PE: Blood pressure 105/72, pulse 97, temperature 98.4 F (36.9 C), temperature source Temporal, resp. rate 16, height 5\' 1"  (1.549 m), weight 120 lb 9.6 oz (54.7 kg). Body mass index is 22.79 kg/m.  Gen: Alert, well appearing.  Patient is oriented to person, place, time, and situation. AFFECT: pleasant, lucid thought and speech. No further exam today  LABS:  POC CC  dipstick UA today showed 1+ blood, otherwise normal  IMPRESSION AND PLAN:  Acute cystitis. Bactrim DS 1 bid x 3d. Sent urine for c/s.  An After Visit Summary was printed and given to the patient.  FOLLOW UP: Return if symptoms worsen or fail to improve.  Signed:  Crissie Sickles, MD           09/18/2019

## 2019-09-19 ENCOUNTER — Ambulatory Visit (INDEPENDENT_AMBULATORY_CARE_PROVIDER_SITE_OTHER): Payer: BC Managed Care – PPO | Admitting: *Deleted

## 2019-09-19 DIAGNOSIS — I63512 Cerebral infarction due to unspecified occlusion or stenosis of left middle cerebral artery: Secondary | ICD-10-CM

## 2019-09-19 LAB — URINE CULTURE
MICRO NUMBER:: 1228368
Result:: NO GROWTH
SPECIMEN QUALITY:: ADEQUATE

## 2019-09-19 LAB — EXTRA URINE SPECIMEN

## 2019-09-19 LAB — CUP PACEART REMOTE DEVICE CHECK
Date Time Interrogation Session: 20201223230843
Implantable Pulse Generator Implant Date: 20181203

## 2019-09-22 NOTE — Progress Notes (Signed)
ILR remote 

## 2019-09-24 ENCOUNTER — Ambulatory Visit (INDEPENDENT_AMBULATORY_CARE_PROVIDER_SITE_OTHER): Payer: BC Managed Care – PPO | Admitting: Student

## 2019-09-24 ENCOUNTER — Other Ambulatory Visit: Payer: Self-pay

## 2019-09-24 ENCOUNTER — Encounter: Payer: Self-pay | Admitting: Student

## 2019-09-24 VITALS — BP 99/61 | HR 91 | Ht 61.0 in | Wt 119.0 lb

## 2019-09-24 DIAGNOSIS — I255 Ischemic cardiomyopathy: Secondary | ICD-10-CM

## 2019-09-24 DIAGNOSIS — I6523 Occlusion and stenosis of bilateral carotid arteries: Secondary | ICD-10-CM | POA: Diagnosis not present

## 2019-09-24 DIAGNOSIS — I480 Paroxysmal atrial fibrillation: Secondary | ICD-10-CM

## 2019-09-24 DIAGNOSIS — I2510 Atherosclerotic heart disease of native coronary artery without angina pectoris: Secondary | ICD-10-CM | POA: Diagnosis not present

## 2019-09-24 DIAGNOSIS — E785 Hyperlipidemia, unspecified: Secondary | ICD-10-CM

## 2019-09-24 NOTE — Progress Notes (Signed)
Cardiology Office Note    Date:  09/24/2019   ID:  IYUNNA Stephenson, DOB 09-09-1961, MRN [id number]  PCP:  Kaitlyn Hillock, DO  Cardiologist: Kaitlyn Lesches, MD    Chief Complaint  Patient presents with  . Follow-up    3 month visit    History of Present Illness:    [name] is a 58 y.o. female with past medical history of CAD (s/p NSTEMI in 10/2017 with 3-vessel CAD by cath and CABG on 11/23/2017 with LIMA-LAD and SVG-OM), PAF (on Xarelto), ischemic cardiomyopathy (EF 35-40% by echo in 10/2017), carotid artery stenosis, HLD, and prior CVA  who presents to the office today for 78-month follow-up.  She was last examined by Dr. Domenic Stephenson in 06/2019 and denied any repeat episodes of chest pain or dyspnea on exertion. She was continued on her current medication regimen and a follow-up echocardiogram was recommended to assess EF. This was performed in 07/2019 and showed that her EF was improved to 50 to 55% with Grade 1 DD and hypokinesis/akinesis of the basal inferior walls. She did have mild to moderate aortic sclerosis without stenosis. She was informed to continue with her current medication regimen.  She was hospitalized at [facility] from 09/10/2019 to 09/13/2019 for evaluation of epigastric pain and was found to have acute pancreatitis with associated sepsis. She was kept NPO at the time of admission and symptoms improved with conservative therapy. CT imaging did show a 2.2 cm cyst versus pseudocyst in the head of the pancreas and repeat imaging was recommended in 6 to 8 weeks.  In talking with the patient today, she reports that her abdominal pain has significantly improved since hospital discharge. She has been trying to follow a more bland diet and has reduced her intake of fatty foods. She denies any recent chest pain or dyspnea on exertion. No recent orthopnea, PND or lower extremity edema.  BP is soft at 99/61 during today's visit which is close to her baseline.  She denies  any associated lightheadedness or dizziness.  She does have outpatient follow-up scheduled with GI for next week and is aware of the need for repeat CT imaging.   Past Medical History:  Diagnosis Date  . Angio-edema   . Arthritis   . CAD (coronary artery disease)    a. s/p NSTEMI in 10/2017 with cath showing LM disease --> s/p CABG on 11/23/2017 with LIMA-LAD and SVG-OM.   [name] Hay fever   . Ischemic cardiomyopathy    a. EF 35-40% by echo in 10/2017  . NSTEMI (non-ST elevated myocardial infarction) ([place])   . PAF (paroxysmal atrial fibrillation) ([place])    Diagnosed by ILR  . Pneumonia 11/16/2017  . Stroke [facility]) 08/23/2017   Left MCA infarct status post TPA and thrombectomy  . Urticaria     Past Surgical History:  Procedure Laterality Date  . CORONARY ARTERY BYPASS GRAFT N/A 11/23/2017   Procedure: CORONARY ARTERY BYPASS GRAFTING (CABG) x 2, ON PUMP, USING LEFT INTERNAL MAMMARY ARTERY AND RIGHT GREATER SAPHENOUS VEIN HARVESTED ENDOSCOPICALLY;  Surgeon: Kaitlyn Pollack, MD;  Location: [place];  Service: Open Heart Surgery;  Laterality: N/A;  . CRYOABLATION     cervical  . IR ANGIO INTRA EXTRACRAN SEL COM CAROTID INNOMINATE UNI R MOD SED  08/21/2017  . IR ANGIO VERTEBRAL SEL SUBCLAVIAN INNOMINATE UNI R MOD SED  08/21/2017  . IR PERCUTANEOUS ART THROMBECTOMY/INFUSION INTRACRANIAL INC DIAG ANGIO  08/21/2017  . IR RADIOLOGIST EVAL & MGMT  10/30/2017  . LOOP RECORDER INSERTION N/A 08/28/2017   Procedure: LOOP RECORDER INSERTION;  Surgeon: Kaitlyn Haw, MD;  Location: [place] CV LAB;  Service: Cardiovascular;  Laterality: N/A;  . RADIOLOGY WITH ANESTHESIA N/A 08/21/2017   Procedure: RADIOLOGY WITH ANESTHESIA;  Surgeon: Kaitlyn Bras, MD;  Location: [place];  Service: Radiology;  Laterality: N/A;  . RIGHT/LEFT HEART CATH AND CORONARY ANGIOGRAPHY N/A 11/20/2017   Procedure: RIGHT/LEFT HEART CATH AND CORONARY ANGIOGRAPHY;  Surgeon: Kaitlyn Booze, MD;  Location: [place] [facility];   Service: Cardiovascular;  Laterality: N/A;  . TEE WITHOUT CARDIOVERSION N/A 08/28/2017   Procedure: TRANSESOPHAGEAL ECHOCARDIOGRAM (TEE);  Surgeon: Kaitlyn Records, MD;  Location: [place];  Service: Cardiovascular;  Laterality: N/A;  . TEE WITHOUT CARDIOVERSION N/A 11/23/2017   Procedure: TRANSESOPHAGEAL ECHOCARDIOGRAM (TEE);  Surgeon: Kaitlyn Pollack, MD;  Location: [place];  Service: Open Heart Surgery;  Laterality: N/A;  . TUBAL LIGATION      Current Medications: Outpatient Medications Prior to Visit  Medication Sig Dispense Refill  . acetaminophen (TYLENOL) 325 MG tablet Take 2 tablets (650 mg total) by mouth every 6 (six) hours as needed for mild pain or moderate pain (or Fever >/= 101).    [name] azelastine (ASTELIN) 0.1 % nasal spray Place 1-2 sprays into both nostrils 2 (two) times daily as needed for rhinitis. Use in each nostril as directed 30 mL 5  . Azelastine-Fluticasone 137-50 MCG/ACT SUSP Place 1 spray into both nostrils 2 (two) times daily as needed. 23 g 5  . baclofen (LIORESAL) 10 MG tablet TAKE 1 TABLET BY MOUTH THREE TIMES DAILY (Patient taking differently: Take 10 mg by mouth 3 (three) times daily. ) 90 tablet 1  . calcium carbonate (TUMS - DOSED IN MG ELEMENTAL CALCIUM) 500 MG chewable tablet Chew 1 tablet by mouth 2 (two) times daily.     . carvedilol (COREG) 3.125 MG tablet Take 1 tablet by mouth twice daily 180 tablet 1  . cholecalciferol (VITAMIN D) 1000 units tablet Take 1,000 Units by mouth daily.    . famotidine (PEPCID) 20 MG tablet Take 20 mg by mouth 2 (two) times daily.    . ferrous sulfate 325 (65 FE) MG tablet Take 325 mg by mouth daily with breakfast. Taking twice weekly    . furosemide (LASIX) 20 MG tablet Take 0.5 tablets (10 mg total) by mouth daily. 45 tablet 1  . hydrOXYzine (ATARAX/VISTARIL) 10 MG tablet Take 1 tablet (10 mg total) by mouth at bedtime. 90 tablet 1  . levocetirizine (XYZAL) 5 MG tablet Take 0.5 tablets (2.5 mg total) by mouth every evening.  45 tablet 3  . levocetirizine (XYZAL) 5 MG tablet Take 1 tablet (5 mg total) by mouth every evening. 60 tablet 5  . loratadine (CLARITIN) 10 MG tablet Take 1 tablet (10 mg total) by mouth daily. 30 tablet 11  . ondansetron (ZOFRAN ODT) 4 MG disintegrating tablet Take 1 tablet (4 mg total) by mouth every 8 (eight) hours as needed for nausea or vomiting. 30 tablet 0  . Potassium Chloride ER 20 MEQ TBCR Take 1 tablet by mouth daily. 30 tablet 11  . predniSONE (DELTASONE) 20 MG tablet 60 mg x3d, 40 mg x3d, 20 mg x2d, 10 mg x2d 21 tablet 0  . sacubitril-valsartan (ENTRESTO) 24-26 MG Take 1 tablet by mouth 2 (two) times daily. 60 tablet 3  . triamcinolone cream (KENALOG) 0.1 % Apply 1 application topically 2 (two) times daily. (Patient taking differently: Apply  1 application topically 2 (two) times daily as needed (rash). ) 30 g 2  . vitamin B-12 (CYANOCOBALAMIN) 100 MCG tablet Take 100 mcg by mouth daily.    Alveda Reasons 20 MG TABS tablet TAKE 1 TABLET BY MOUTH ONCE DAILY WITH SUPPER (Patient taking differently: Take 20 mg by mouth daily with supper. Crushed Xarelto can be given down a G-tube but NOT a J-Tube.) 90 tablet 1   No facility-administered medications prior to visit.     Allergies:   Lopressor [metoprolol tartrate], Allegra allergy [fexofenadine hcl], Dilaudid [hydromorphone], Nsaids, Gabapentin (once-daily), and Lipitor [atorvastatin]   Social History   Socioeconomic History  . Marital status: Married    Spouse name: Not on file  . Number of children: 2  . Years of education: Not on file  . Highest education level: Not on file  Occupational History  . Not on file  Tobacco Use  . Smoking status: Former Smoker    Packs/day: 0.10    Years: 37.00    Pack years: 3.70    Types: Cigarettes    Quit date: 07/17/2018    Years since quitting: 1.1  . Smokeless tobacco: Never Used  Substance and Sexual Activity  . Alcohol use: No  . Drug use: No  . Sexual activity: Yes    Partners: Male   Other Topics Concern  . Not on file  Social History Narrative   Married. 2 children.    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    Walks with cane or wheelchair.    Former smoker.    Drinks caffeine.    Smoke alarm in the home.   Firearms in the home.    Wears her seatbelt.    Feels safe In her relationships.    Social Determinants of Health   Financial Resource Strain:   . Difficulty of Paying Living Expenses: Not on file  Food Insecurity:   . Worried About Charity fundraiser in the Last Year: Not on file  . Ran Out of Food in the Last Year: Not on file  Transportation Needs:   . Lack of Transportation (Medical): Not on file  . Lack of Transportation (Non-Medical): Not on file  Physical Activity:   . Days of Exercise per Week: Not on file  . Minutes of Exercise per Session: Not on file  Stress:   . Feeling of Stress : Not on file  Social Connections:   . Frequency of Communication with Friends and Family: Not on file  . Frequency of Social Gatherings with Friends and Family: Not on file  . Attends Religious Services: Not on file  . Active Member of Clubs or Organizations: Not on file  . Attends Archivist Meetings: Not on file  . Marital Status: Not on file     Family History:  The patient's family history includes Breast cancer (age of onset: 46) in her cousin; Diabetes type II in her mother; Heart attack in her father; Hyperlipidemia in her mother; Hypertension in her brother and mother; Urticaria in her mother.   Review of Systems:   Please see the history of present illness.     General:  No chills, fever, night sweats or weight changes.  Cardiovascular:  No chest pain, dyspnea on exertion, edema, orthopnea, palpitations, paroxysmal nocturnal dyspnea. Dermatological: No rash, lesions/masses Respiratory: No cough, dyspnea Urologic: No hematuria, dysuria Abdominal:   No nausea, vomiting, diarrhea, bright red blood per rectum, melena, or hematemesis. Positive for  abdominal pain (improved).  Neurologic:  No visual changes, wkns, changes in mental status.  All other systems reviewed and are otherwise negative except as noted above.   Physical Exam:    VS:  BP 99/61   Pulse 91   Ht 5\' 1"  (1.549 m)   Wt 119 lb (54 kg)   LMP  (LMP Unknown)   SpO2 96%   BMI 22.48 kg/m    General: Well developed, well nourished,female appearing in no acute distress. Head: Normocephalic, atraumatic, sclera non-icteric, no xanthomas, nares are without discharge.  Neck: No carotid bruits. JVD not elevated.  Lungs: Respirations regular and unlabored, without wheezes or rales.  Heart: Regular rate and rhythm. No S3 or S4.  No murmur, no rubs, or gallops appreciated. Abdomen: Soft, non-tender, non-distended with normoactive bowel sounds. No hepatomegaly. No rebound/guarding. No obvious abdominal masses. Msk:  Strength and tone appear normal for age. No joint deformities or effusions. Extremities: No clubbing or cyanosis. No lower extremity edema.  Distal pedal pulses are 2+ bilaterally. Neuro: Alert and oriented X 3. Moves all extremities spontaneously. Right-sided weakness noted.  Psych:  Responds to questions appropriately with a normal affect. Skin: No rashes or lesions noted  Wt Readings from Last 3 Encounters:  09/24/19 119 lb (54 kg)  09/18/19 120 lb 9.6 oz (54.7 kg)  09/16/19 122 lb 3 oz (55.4 kg)     Studies/Labs Reviewed:   EKG:  EKG is not ordered today.   Recent Labs: 09/13/2019: ALT 13; BUN <5; Creatinine, Ser 0.52; Hemoglobin 13.3; Platelets 312; Potassium 3.8; Sodium 139   Lipid Panel    Component Value Date/Time   CHOL 203 (H) 09/11/2019 0453   TRIG 85 09/11/2019 0453   HDL 28 (L) 09/11/2019 0453   CHOLHDL 7.3 09/11/2019 0453   VLDL 17 09/11/2019 0453   LDLCALC 158 (H) 09/11/2019 0453    Additional studies/ Stephenson that were reviewed today include:   Echocardiogram: 07/2019 IMPRESSIONS    1. Left ventricular ejection fraction, by  visual estimation, is 50 to 55%. The left ventricle has low normal function. There is no left ventricular hypertrophy.  2. Left ventricular diastolic parameters are consistent with Grade I diastolic dysfunction (impaired relaxation).  3. Hypokinesis/ akinesis of the basal inferior/inferior walls.  4. Global right ventricle has normal systolic function.The right ventricular size is normal. No increase in right ventricular wall thickness.  5. Left atrial size was normal.  6. Right atrial size was normal.  7. The mitral valve is normal in structure. No evidence of mitral valve regurgitation.  8. The tricuspid valve is normal in structure. Tricuspid valve regurgitation is not demonstrated.  9. The aortic valve is tricuspid. Aortic valve regurgitation is not visualized. Mild to moderate aortic valve sclerosis/calcification without any evidence of aortic stenosis. 10. The pulmonic valve was not well visualized. Pulmonic valve regurgitation is not visualized.   Assessment:    1. Coronary artery disease involving native coronary artery of native heart without angina pectoris   2. Ischemic cardiomyopathy   3. Paroxysmal atrial fibrillation (HCC)   4. Bilateral carotid artery stenosis   5. Hyperlipidemia LDL goal <70      Plan:   In order of problems listed above:  1. CAD - s/p NSTEMI in 10/2017 with 3-vessel CAD by cath and CABG on 11/23/2017 with LIMA-LAD and SVG-OM.  - She denies any recent chest pain or dyspnea on exertion. - Continue current regimen at this time with Coreg 3.125 mg twice daily. She is not on ASA  given the need for anticoagulation. Statin therapy is currently held as it was thought by her Allergist she was possibly allergic to this. She has follow-up with them scheduled for next week and if cleared to restart at that time, would plan to resume. If not, we did discuss alternatives today such as Zetia or possible PCSK9 inhibitor therapy.  2. Ischemic Cardiomyopathy - EF was  previously reduced at 35 to 40% by echocardiogram in 10/2017, improved to 50 to 55% by most recent ultrasound.  She appears euvolemic by examination. Continue current regimen at this time with Coreg 3.125 mg twice daily, Entresto 24-26mg  BID and Lasix 10mg  daily.   3. Paroxysmal Atrial Fibrillation - She denies any recent palpitations and is in NSR by examination today. Continue Coreg 3.125 mg twice daily for rate control. She denies any evidence of active bleeding. Remains on Xarelto for anticoagulation.    4. Carotid Artery Stenosis - Carotid Dopplers in 11/2018 showed 1 to 39% RICA stenosis and 40 to [id number] LICA stenosis. Followed by Vascular Surgery.   5. HLD - Her prior Crestor is currently held given concerns for an allergic reaction. She does have follow-up with her Allergist next week. If cleared to resume, would plan to do so. If not, would consider an alternative such as Zetia or PCSK9 inhibitor therapy.   Medication Adjustments/Labs and Tests Ordered: Current medicines are reviewed at length with the patient today.  Concerns regarding medicines are outlined above.  Medication changes, Labs and Tests ordered today are listed in the Patient Instructions below. Patient Instructions  Medication Instructions:  Your physician recommends that you continue on your current medications as directed. Please refer to the Current Medication list given to you today.  *If you need a refill on your cardiac medications before your next appointment, please call your pharmacy*  Lab Work: None today If you have labs (blood work) drawn today and your tests are completely normal, you will receive your results only by: [name] MyChart Message (if you have MyChart) OR . A paper copy in the mail If you have any lab test that is abnormal or we need to change your treatment, we will call you to review the results.  Testing/Procedures: None today  Follow-Up: At [facility], you and your health needs are our  priority.  As part of our continuing mission to provide you with exceptional heart care, we have created designated Provider Care Teams.  These Care Teams include your primary Cardiologist (physician) and Advanced Practice Providers (APPs -  Physician Assistants and Nurse Practitioners) who all work together to provide you with the care you need, when you need it.  Your next appointment:   4-5  month(s)  The format for your next appointment:   In Person  Provider:   [name], MD  Other Instructions None   Thank you for choosing [facility] !         Signed, Erma Heritage, PA-C  09/24/2019 5:38 PM    [place] [address] [place], [place] [address] Phone: [phone] Fax: [phone]

## 2019-09-24 NOTE — Patient Instructions (Signed)
Medication Instructions:  Your physician recommends that you continue on your current medications as directed. Please refer to the Current Medication list given to you today.  *If you need a refill on your cardiac medications before your next appointment, please call your pharmacy*  Lab Work: None today If you have labs (blood work) drawn today and your tests are completely normal, you will receive your results only by: [name] MyChart Message (if you have MyChart) OR . A paper copy in the mail If you have any lab test that is abnormal or we need to change your treatment, we will call you to review the results.  Testing/Procedures: None today  Follow-Up: At [facility], you and your health needs are our priority.  As part of our continuing mission to provide you with exceptional heart care, we have created designated Provider Care Teams.  These Care Teams include your primary Cardiologist (physician) and Advanced Practice Providers (APPs -  Physician Assistants and Nurse Practitioners) who all work together to provide you with the care you need, when you need it.  Your next appointment:   4-5  month(s)  The format for your next appointment:   In Person  Provider:   [name], MD  Other Instructions None   Thank you for choosing [facility] !

## 2019-09-25 ENCOUNTER — Other Ambulatory Visit: Payer: Self-pay | Admitting: Cardiology

## 2019-09-25 ENCOUNTER — Other Ambulatory Visit: Payer: Self-pay | Admitting: Physical Medicine & Rehabilitation

## 2019-09-25 ENCOUNTER — Other Ambulatory Visit: Payer: Self-pay | Admitting: Family Medicine

## 2019-09-25 NOTE — Telephone Encounter (Signed)
Received refill request for Xarelto.  Reviewed labs from 09/13/2019:  SCr 0.52  Hgb 13.3  Hct 40.6  Plts 312.  CrCl 99.30 Meets criteria for Xarelto 20mg  daily.   Refill approved.

## 2019-10-02 LAB — CBC
Hematocrit: 48.4 % — ABNORMAL HIGH (ref 34.0–46.6)
Hemoglobin: 16.1 g/dL — ABNORMAL HIGH (ref 11.1–15.9)
MCH: 31.4 pg (ref 26.6–33.0)
MCHC: 33.3 g/dL (ref 31.5–35.7)
MCV: 94 fL (ref 79–97)
Platelets: 526 10*3/uL — ABNORMAL HIGH (ref 150–450)
RBC: 5.13 x10E6/uL (ref 3.77–5.28)
RDW: 12.8 % (ref 11.7–15.4)
WBC: 9.4 10*3/uL (ref 3.4–10.8)

## 2019-10-02 LAB — COMPREHENSIVE METABOLIC PANEL
ALT: 30 IU/L (ref 0–32)
AST: 31 IU/L (ref 0–40)
Albumin/Globulin Ratio: 1.4 (ref 1.2–2.2)
Albumin: 4.2 g/dL (ref 3.8–4.9)
Alkaline Phosphatase: 81 IU/L (ref 39–117)
BUN/Creatinine Ratio: 18 (ref 9–23)
BUN: 12 mg/dL (ref 6–24)
Bilirubin Total: <0.2 mg/dL (ref 0.0–1.2)
CO2: 27 mmol/L (ref 20–29)
Calcium: 10 mg/dL (ref 8.7–10.2)
Chloride: 100 mmol/L (ref 96–106)
Creatinine, Ser: 0.68 mg/dL (ref 0.57–1.00)
GFR calc Af Amer: 111 mL/min/{1.73_m2} (ref >59)
GFR calc non Af Amer: 96 mL/min/{1.73_m2} (ref >59)
Globulin, Total: 3 g/dL (ref 1.5–4.5)
Glucose: 77 mg/dL (ref 65–99)
Potassium: 4.5 mmol/L (ref 3.5–5.2)
Sodium: 139 mmol/L (ref 134–144)
Total Protein: 7.2 g/dL (ref 6.0–8.5)

## 2019-10-02 LAB — THYROID ANTIBODIES
Thyroglobulin Antibody: 52.8 IU/mL — ABNORMAL HIGH (ref 0.0–0.9)
Thyroperoxidase Ab SerPl-aCnc: 349 IU/mL — ABNORMAL HIGH (ref 0–34)

## 2019-10-02 LAB — ANA: Anti Nuclear Antibody (ANA): NEGATIVE

## 2019-10-02 LAB — ALPHA-GAL PANEL
Alpha Gal IgE*: <0.1 kU/L (ref <0.10)
Beef (Bos spp) IgE: <0.1 kU/L (ref <0.35)
Class Interpretation: 0
Class Interpretation: 0
Class Interpretation: 0
Lamb/Mutton (Ovis spp) IgE: <0.1 kU/L (ref <0.35)
Pork (Sus spp) IgE: <0.1 kU/L (ref <0.35)

## 2019-10-02 LAB — H PYLORI, IGM, IGG, IGA AB
H pylori, IgM Abs: <9 units (ref 0.0–8.9)
H. pylori, IgA Abs: <9 units (ref 0.0–8.9)
H. pylori, IgG AbS: 0.15 Index Value (ref 0.00–0.79)

## 2019-10-02 LAB — CHRONIC URTICARIA: cu index: 5.5 (ref <10)

## 2019-10-02 LAB — TRYPTASE: Tryptase: 8.6 ug/L (ref 2.2–13.2)

## 2019-10-02 LAB — C4 COMPLEMENT: Complement C4, Serum: 53 mg/dL — ABNORMAL HIGH (ref 12–38)

## 2019-10-02 LAB — SEDIMENTATION RATE: Sed Rate: 37 mm/hr (ref 0–40)

## 2019-10-09 ENCOUNTER — Other Ambulatory Visit: Payer: Self-pay | Admitting: Family Medicine

## 2019-10-11 ENCOUNTER — Telehealth: Payer: Self-pay

## 2019-10-11 ENCOUNTER — Other Ambulatory Visit (INDEPENDENT_AMBULATORY_CARE_PROVIDER_SITE_OTHER): Payer: 59

## 2019-10-11 ENCOUNTER — Other Ambulatory Visit: Payer: Self-pay

## 2019-10-11 ENCOUNTER — Encounter: Payer: Self-pay | Admitting: Gastroenterology

## 2019-10-11 ENCOUNTER — Inpatient Hospital Stay (HOSPITAL_COMMUNITY)
Admission: EM | Admit: 2019-10-11 | Discharge: 2019-10-14 | DRG: 439 | Disposition: A | Discharge status: Home / Self Care | Payer: 59 | Attending: Internal Medicine | Admitting: Internal Medicine

## 2019-10-11 ENCOUNTER — Encounter (HOSPITAL_COMMUNITY): Payer: Self-pay | Admitting: Emergency Medicine

## 2019-10-11 ENCOUNTER — Ambulatory Visit (INDEPENDENT_AMBULATORY_CARE_PROVIDER_SITE_OTHER): Payer: 59 | Admitting: Gastroenterology

## 2019-10-11 ENCOUNTER — Inpatient Hospital Stay (HOSPITAL_COMMUNITY): Payer: 59

## 2019-10-11 VITALS — BP 102/64 | HR 105 | Temp 97.7°F | Ht 61.0 in | Wt 119.4 lb

## 2019-10-11 DIAGNOSIS — K862 Cyst of pancreas: Secondary | ICD-10-CM

## 2019-10-11 DIAGNOSIS — Z79899 Other long term (current) drug therapy: Secondary | ICD-10-CM

## 2019-10-11 DIAGNOSIS — Z87891 Personal history of nicotine dependence: Secondary | ICD-10-CM

## 2019-10-11 DIAGNOSIS — E785 Hyperlipidemia, unspecified: Secondary | ICD-10-CM | POA: Diagnosis present

## 2019-10-11 DIAGNOSIS — Z7901 Long term (current) use of anticoagulants: Secondary | ICD-10-CM

## 2019-10-11 DIAGNOSIS — Z885 Allergy status to narcotic agent status: Secondary | ICD-10-CM

## 2019-10-11 DIAGNOSIS — K863 Pseudocyst of pancreas: Secondary | ICD-10-CM | POA: Diagnosis present

## 2019-10-11 DIAGNOSIS — Z803 Family history of malignant neoplasm of breast: Secondary | ICD-10-CM

## 2019-10-11 DIAGNOSIS — I719 Aortic aneurysm of unspecified site, without rupture: Secondary | ICD-10-CM | POA: Diagnosis present

## 2019-10-11 DIAGNOSIS — M170 Bilateral primary osteoarthritis of knee: Secondary | ICD-10-CM | POA: Diagnosis present

## 2019-10-11 DIAGNOSIS — I5032 Chronic diastolic (congestive) heart failure: Secondary | ICD-10-CM | POA: Diagnosis present

## 2019-10-11 DIAGNOSIS — K859 Acute pancreatitis without necrosis or infection, unspecified: Secondary | ICD-10-CM | POA: Diagnosis not present

## 2019-10-11 DIAGNOSIS — R140 Abdominal distension (gaseous): Secondary | ICD-10-CM | POA: Diagnosis not present

## 2019-10-11 DIAGNOSIS — Z8249 Family history of ischemic heart disease and other diseases of the circulatory system: Secondary | ICD-10-CM | POA: Diagnosis not present

## 2019-10-11 DIAGNOSIS — K861 Other chronic pancreatitis: Principal | ICD-10-CM | POA: Diagnosis present

## 2019-10-11 DIAGNOSIS — Z951 Presence of aortocoronary bypass graft: Secondary | ICD-10-CM | POA: Diagnosis not present

## 2019-10-11 DIAGNOSIS — R1310 Dysphagia, unspecified: Secondary | ICD-10-CM | POA: Diagnosis present

## 2019-10-11 DIAGNOSIS — I110 Hypertensive heart disease with heart failure: Secondary | ICD-10-CM | POA: Diagnosis present

## 2019-10-11 DIAGNOSIS — I69391 Dysphagia following cerebral infarction: Secondary | ICD-10-CM

## 2019-10-11 DIAGNOSIS — R1010 Upper abdominal pain, unspecified: Secondary | ICD-10-CM | POA: Diagnosis not present

## 2019-10-11 DIAGNOSIS — Z8349 Family history of other endocrine, nutritional and metabolic diseases: Secondary | ICD-10-CM | POA: Diagnosis not present

## 2019-10-11 DIAGNOSIS — Z20822 Contact with and (suspected) exposure to covid-19: Secondary | ICD-10-CM | POA: Diagnosis present

## 2019-10-11 DIAGNOSIS — Z9981 Dependence on supplemental oxygen: Secondary | ICD-10-CM | POA: Diagnosis not present

## 2019-10-11 DIAGNOSIS — Z886 Allergy status to analgesic agent status: Secondary | ICD-10-CM | POA: Diagnosis not present

## 2019-10-11 DIAGNOSIS — I63512 Cerebral infarction due to unspecified occlusion or stenosis of left middle cerebral artery: Secondary | ICD-10-CM | POA: Diagnosis not present

## 2019-10-11 DIAGNOSIS — I2510 Atherosclerotic heart disease of native coronary artery without angina pectoris: Secondary | ICD-10-CM | POA: Diagnosis present

## 2019-10-11 DIAGNOSIS — R109 Unspecified abdominal pain: Secondary | ICD-10-CM | POA: Diagnosis not present

## 2019-10-11 DIAGNOSIS — Z833 Family history of diabetes mellitus: Secondary | ICD-10-CM

## 2019-10-11 DIAGNOSIS — R110 Nausea: Secondary | ICD-10-CM

## 2019-10-11 DIAGNOSIS — R1013 Epigastric pain: Secondary | ICD-10-CM | POA: Diagnosis present

## 2019-10-11 DIAGNOSIS — I252 Old myocardial infarction: Secondary | ICD-10-CM | POA: Diagnosis not present

## 2019-10-11 DIAGNOSIS — I480 Paroxysmal atrial fibrillation: Secondary | ICD-10-CM | POA: Diagnosis present

## 2019-10-11 DIAGNOSIS — I69351 Hemiplegia and hemiparesis following cerebral infarction affecting right dominant side: Secondary | ICD-10-CM

## 2019-10-11 DIAGNOSIS — I482 Chronic atrial fibrillation, unspecified: Secondary | ICD-10-CM | POA: Diagnosis present

## 2019-10-11 DIAGNOSIS — Z888 Allergy status to other drugs, medicaments and biological substances status: Secondary | ICD-10-CM

## 2019-10-11 DIAGNOSIS — L509 Urticaria, unspecified: Secondary | ICD-10-CM | POA: Diagnosis present

## 2019-10-11 DIAGNOSIS — I255 Ischemic cardiomyopathy: Secondary | ICD-10-CM | POA: Diagnosis present

## 2019-10-11 DIAGNOSIS — I25810 Atherosclerosis of coronary artery bypass graft(s) without angina pectoris: Secondary | ICD-10-CM | POA: Diagnosis present

## 2019-10-11 DIAGNOSIS — K219 Gastro-esophageal reflux disease without esophagitis: Secondary | ICD-10-CM | POA: Diagnosis present

## 2019-10-11 LAB — COMPREHENSIVE METABOLIC PANEL
ALT: 10 U/L (ref 0–35)
AST: 16 U/L (ref 0–37)
Albumin: 4.3 g/dL (ref 3.5–5.2)
Alkaline Phosphatase: 73 U/L (ref 39–117)
BUN: 14 mg/dL (ref 6–23)
CO2: 28 mEq/L (ref 19–32)
Calcium: 10.5 mg/dL (ref 8.4–10.5)
Chloride: 100 mEq/L (ref 96–112)
Creatinine, Ser: 0.77 mg/dL (ref 0.40–1.20)
GFR: 76.61 mL/min (ref >60.00)
Glucose, Bld: 79 mg/dL (ref 70–99)
Potassium: 4.4 mEq/L (ref 3.5–5.1)
Sodium: 137 mEq/L (ref 135–145)
Total Bilirubin: 0.5 mg/dL (ref 0.2–1.2)
Total Protein: 7.7 g/dL (ref 6.0–8.3)

## 2019-10-11 LAB — CBC WITH DIFFERENTIAL/PLATELET
Basophils Absolute: 0 10*3/uL (ref 0.0–0.1)
Basophils Relative: 0.4 % (ref 0.0–3.0)
Eosinophils Absolute: 0.2 10*3/uL (ref 0.0–0.7)
Eosinophils Relative: 1.8 % (ref 0.0–5.0)
HCT: 46.2 % — ABNORMAL HIGH (ref 36.0–46.0)
Hemoglobin: 15.4 g/dL — ABNORMAL HIGH (ref 12.0–15.0)
Lymphocytes Relative: 26.4 % (ref 12.0–46.0)
Lymphs Abs: 2.7 10*3/uL (ref 0.7–4.0)
MCHC: 33.4 g/dL (ref 30.0–36.0)
MCV: 96 fl (ref 78.0–100.0)
Monocytes Absolute: 1 10*3/uL (ref 0.1–1.0)
Monocytes Relative: 10 % (ref 3.0–12.0)
Neutro Abs: 6.2 10*3/uL (ref 1.4–7.7)
Neutrophils Relative %: 61.4 % (ref 43.0–77.0)
Platelets: 430 10*3/uL — ABNORMAL HIGH (ref 150.0–400.0)
RBC: 4.81 Mil/uL (ref 3.87–5.11)
RDW: 14.5 % (ref 11.5–15.5)
WBC: 10.1 10*3/uL (ref 4.0–10.5)

## 2019-10-11 LAB — AMYLASE: Amylase: 677 U/L — ABNORMAL HIGH (ref 27–131)

## 2019-10-11 LAB — LIPASE: Lipase: 2834 U/L — ABNORMAL HIGH (ref 11.0–59.0)

## 2019-10-11 MED ORDER — IOHEXOL 300 MG/ML  SOLN
100.0000 mL | Freq: Once | INTRAMUSCULAR | Status: AC | PRN
  Administered 2019-10-11: 20:00:00 100 mL via INTRAVENOUS

## 2019-10-11 MED ORDER — SODIUM CHLORIDE 0.9 % IV SOLN: Freq: Once | INTRAVENOUS | Status: DC

## 2019-10-11 MED ORDER — FENTANYL CITRATE (PF) 100 MCG/2ML IJ SOLN
25.0000 ug | INTRAMUSCULAR | Status: DC | PRN
  Administered 2019-10-12 (×2): 50 ug via INTRAVENOUS
  Filled 2019-10-11 (×2): qty 2

## 2019-10-11 MED ORDER — FUROSEMIDE 20 MG PO TABS: 10.0000 mg | ORAL_TABLET | Freq: Every day | ORAL | 1 refills | Status: DC

## 2019-10-11 MED ORDER — SODIUM CHLORIDE 0.9 % IV SOLN: INTRAVENOUS | Status: AC

## 2019-10-11 MED ORDER — ONDANSETRON HCL 4 MG/2ML IJ SOLN: 4.0000 mg | Freq: Four times a day (QID) | INTRAMUSCULAR | Status: DC | PRN

## 2019-10-11 MED ORDER — ONDANSETRON HCL 4 MG/2ML IJ SOLN
4.0000 mg | Freq: Once | INTRAMUSCULAR | Status: AC
  Administered 2019-10-11: 20:00:00 4 mg via INTRAVENOUS
  Filled 2019-10-11: qty 2

## 2019-10-11 MED ORDER — MORPHINE SULFATE (PF) 4 MG/ML IV SOLN
4.0000 mg | Freq: Once | INTRAVENOUS | Status: AC
  Administered 2019-10-11: 20:00:00 4 mg via INTRAVENOUS
  Filled 2019-10-11: qty 1

## 2019-10-11 MED ORDER — SODIUM CHLORIDE 0.9 % IV BOLUS
1000.0000 mL | Freq: Once | INTRAVENOUS | Status: AC
  Administered 2019-10-11: 1000 mL via INTRAVENOUS

## 2019-10-11 MED ORDER — SODIUM CHLORIDE (PF) 0.9 % IJ SOLN
INTRAMUSCULAR | Status: AC
  Filled 2019-10-11: qty 50

## 2019-10-11 NOTE — ED Triage Notes (Signed)
Pt reports that she was sent by PCP for possible pancreatitis. Having abd pains with nausea since got out of hospital back in December.  Sent for Lipase 2,834.

## 2019-10-11 NOTE — Telephone Encounter (Signed)
Received refill request from [facility] saying they did not receive patients RX for Furosemide that was sent on 09/14/2019 and to please resend. RX was resent to pharmacy.

## 2019-10-11 NOTE — Progress Notes (Signed)
P  Chief Complaint:    Hospital follow-up, acute pancreatitis, abdominal pain  GI History: 59 year old female with a history of CVA, CAD/two-vessel CABG, CHF, atrial fibrillation (on Xarelto), admitted 12/15-18, 2020 with acute pancreatitis with unclear etiology.  Admission CT notable for 2.2 cm cyst versus pseudocyst in the head of pancreas.  Did well with n.p.o., IV fluids, pain control, and discharged tolerating solid diet with plan for outpatient CT in 6-8 weeks to reassess pancreatic cyst, with EUS if still present.  HPI:     Patient is a 59 y.o. female presenting to the Gastroenterology Clinic for hospital follow-up.  Admitted 08/2019 with acute pancreatitis without clear etiology as outlined above.  Since hospital discharge, has followed up in the Cardiology clinic (no medication changes), and with PCM.  Treated for acute cystitis with Bactrim on 09/18/2019. Also evaluated by Allergy/Immunology and diagnosed with Autoimmune Urticaria.   Today, she states she has had recurrence of intermittent upper abdominal pain and abdominal distension, reduced appetite. Maintains good hydration. Intermittent nausea without emesis; has used prn antiemetic (Zofran) a few times.  Sxs restarted approx 1 week after hospital d/c. Maintaining BRAT diet and water. Sxs similar in quality and location, but less intense as recent Acute Pancreatitis. +Bloating. No change in bowel habits.   Separately with intermittent hematuria. Treated for possible cystitis on 12/23 (culture negative).   Repeat CBC and CMP on 09/24/2019.  CMP normal.  CBC with normal WBC, PLT H/H 16/48 and PLT 526, all up from discharge (13/40, 312).  No new imaging for review.  Review of systems:     No chest pain, no SOB, no fevers, no urinary sx   Past Medical History:  Diagnosis Date  . Angio-edema   . Arthritis   . CAD (coronary artery disease)    a. s/p NSTEMI in 10/2017 with cath showing LM disease --> s/p CABG on 11/23/2017 with  LIMA-LAD and SVG-OM.   [name] Hay fever   . Ischemic cardiomyopathy    a. EF 35-40% by echo in 10/2017  . NSTEMI (non-ST elevated myocardial infarction) ([place])   . PAF (paroxysmal atrial fibrillation) ([place])    Diagnosed by ILR  . Pneumonia 11/16/2017  . Stroke [facility]) 08/23/2017   Left MCA infarct status post TPA and thrombectomy  . Urticaria     Patient's surgical history, family medical history, social history, medications and allergies were all reviewed in Epic    Current Outpatient Medications  Medication Sig Dispense Refill  . acetaminophen (TYLENOL) 325 MG tablet Take 2 tablets (650 mg total) by mouth every 6 (six) hours as needed for mild pain or moderate pain (or Fever >/= 101).    [name] azelastine (ASTELIN) 0.1 % nasal spray Place 1-2 sprays into both nostrils 2 (two) times daily as needed for rhinitis. Use in each nostril as directed 30 mL 5  . Azelastine-Fluticasone 137-50 MCG/ACT SUSP Place 1 spray into both nostrils 2 (two) times daily as needed. 23 g 5  . baclofen (LIORESAL) 10 MG tablet TAKE 1 TABLET BY MOUTH THREE TIMES DAILY 90 tablet 2  . calcium carbonate (TUMS - DOSED IN MG ELEMENTAL CALCIUM) 500 MG chewable tablet Chew 1 tablet by mouth 2 (two) times daily.     . carvedilol (COREG) 3.125 MG tablet Take 1 tablet by mouth twice daily 180 tablet 1  . cholecalciferol (VITAMIN D) 1000 units tablet Take 1,000 Units by mouth daily.    . famotidine (PEPCID) 20 MG tablet Take 20 mg  by mouth 2 (two) times daily.    . ferrous sulfate 325 (65 FE) MG tablet Take 325 mg by mouth daily with breakfast. Taking twice weekly    . furosemide (LASIX) 20 MG tablet Take 0.5 tablets (10 mg total) by mouth daily. 45 tablet 1  . ondansetron (ZOFRAN ODT) 4 MG disintegrating tablet Take 1 tablet (4 mg total) by mouth every 8 (eight) hours as needed for nausea or vomiting. 30 tablet 0  . Potassium Chloride ER 20 MEQ TBCR Take 1 tablet by mouth daily. 30 tablet 11  . rivaroxaban (XARELTO) 20 MG TABS tablet  Take 1 tablet daily with evening meal 90 tablet 3  . sacubitril-valsartan (ENTRESTO) 24-26 MG Take 1 tablet by mouth 2 (two) times daily. 60 tablet 3  . triamcinolone cream (KENALOG) 0.1 % Apply 1 application topically 2 (two) times daily. (Patient taking differently: Apply 1 application topically 2 (two) times daily as needed (rash). ) 30 g 2  . vitamin B-12 (CYANOCOBALAMIN) 100 MCG tablet Take 100 mcg by mouth daily.    . hydrOXYzine (ATARAX/VISTARIL) 10 MG tablet Take 1 tablet (10 mg total) by mouth at bedtime. (Patient not taking: Reported on 10/11/2019) 90 tablet 1  . levocetirizine (XYZAL) 5 MG tablet Take 1 tablet (5 mg total) by mouth every evening. (Patient not taking: Reported on 10/11/2019) 60 tablet 5  . loratadine (CLARITIN) 10 MG tablet Take 1 tablet (10 mg total) by mouth daily. (Patient not taking: Reported on 10/11/2019) 30 tablet 11  . predniSONE (DELTASONE) 20 MG tablet 60 mg x3d, 40 mg x3d, 20 mg x2d, 10 mg x2d (Patient not taking: Reported on 10/11/2019) 21 tablet 0   No current facility-administered medications for this visit.    Physical Exam:     BP 102/64   Pulse (!) 105   Temp 97.7 F (36.5 C)   Ht 5\' 1"  (1.549 m)   Wt 119 lb 6 oz (54.1 kg)   LMP  (LMP Unknown)   BMI 22.56 kg/m   GENERAL:  Pleasant female in NAD PSYCH: : Cooperative, normal affect EENT:  conjunctiva pink, mucous membranes moist, neck supple without masses CARDIAC:  RRR, no murmur heard, no peripheral edema PULM: Normal respiratory effort, lungs CTA bilaterally, no wheezing ABDOMEN:  TTP in upper abdomen. No guarding or peritoneal signs. Mildly distended from baseline per patient. Not tympanic. Soft. No obvious masses, no hepatomegaly,  normal bowel sounds SKIN:  turgor, no lesions seen Musculoskeletal:  Normal muscle tone, normal strength NEURO: Alert and oriented x 3, no focal neurologic deficits   IMPRESSION and PLAN:    1) Recent admission for Acute pancreatitis 2) Upper abdominal  pain 3) Nausea without emesis 4) Abdominal bloating 5) Hemoconcentration 6) Pancreatic cyst  Recent admission acute pancreatitis without clear etiology.  Imaging without cholelithiasis, normal TG, no EtOH, no recent medication changes.  Imaging did demonstrate 2.2 cm cyst and HOP.  She now has recurrence of abdominal pain and nausea, albeit less severe than previous.  Interestingly, was recently diagnosed with Autoimmune Urticaria.  Query Autoimmune Pancreatitis.  -CBC, CMP, amylase, lipase today. If recurrence of AP, may require readmission - Check IgG4 -Had plan on repeat CT in 2 more weeks, but certainly could require sooner pending labs/clinical  - Reviewed medication list again today. Only furosemide on list of commonly implicated medications, despite not being a new medication. Pending w/u, may need to d/c this Rx - Interestingly, was recently given steroid burst for urticaria, but never took  these  I spent 35 minutes of time, including in depth chart review, independent review of results as outlined above, communicating results with the patient directly, face-to-face time with the patient, coordinating care, and ordering studies and medications as appropriate, and documentation.  Gerrit Heck, DO, [facility]  Gastroenterology   Addendum: Labs notable for the following: -WBC 10.1, H/H 15.4/46.2, PLT 438 -Normal CMP -Amylase 677, lipase 2834  I called the patient to discuss the lab results.  She states her pain has increased through the day, worse after trialing some food after our appointment this morning.  Labs certainly indicative of recurrence of acute pancreatitis, but thankfully without renal insufficiency and improvement in previously noted hemoconcentration.  No leukocytosis.  Given symptomatology and progressing symptoms, do not think she will be able to treat this adequately as an outpatient, and I recommended returning to the ER for readmission, IV fluids, pain control,  resume n.p.o. likely repeat CT, potentially RUQ [place] to r/o cholelithiasis.  May eventually need MRI/MRCP for Acute Recurrent Pancreatitis without clear etiology.  I am suspicious for Autoimmune Pancreatitis.  IgG4 pending.  Depending on response to therapy, may consider trial of steroids for diagnostic/therapuetic intent (part of HISORt criteria).   All questions answered, and will plan on heading to [facility] ER. I called ER staff to provide information of her pending arrival.        Lavena Bullion ,DO, [facility] 10/11/2019, 11:29 AM

## 2019-10-11 NOTE — H&P (Signed)
History and Physical    JACKYE MULVEY [id number] DOB: [birth year] DOA: 10/11/2019  PCP: [name], DO   Patient coming from: Home   Chief Complaint: Upper abdominal pain, nausea   HPI: [name] is a 59 y.o. female with medical history significant for coronary artery disease, history of CVA with right-sided motor deficits, paroxysmal atrial fibrillation on Xarelto, and recent admission for acute pancreatitis, now returning to the ED for evaluation of severe recurrent abdominal pain with marked elevation in lipase on outpatient blood work.  Patient was discharged from the hospital just under a month ago after management of acute idiopathic pancreatitis.  She does not drink alcohol, triglycerides were normal, she had normal transaminases and bilirubin, and a 2.2 cm cystic lesion within the pancreatic head was noted on CT at that time.  She reports that symptoms were completely resolved when she first returned home from the hospital, but she began to develop recurrent upper abdominal pain within a few days of the discharge.  Since the recent discharge, she has also had some gross hematuria and completed 5 days of Bactrim.  She was recently suspected of having autoimmune urticaria, has a prescription for prednisone, but has not taken any.  IgG4 level is pending on outpatient labs.  ED Course: Upon arrival to the ED, patient is found to be afebrile, saturating well on room air, slightly tachycardic, and with stable blood pressure.  Chemistry panel was notable for lipase of 2834 and amylase 677.  CBC with hemoglobin 15.4 and platelets 430,000.  CT the abdomen and pelvis demonstrates slight improvement in likely pancreatic pseudocyst.  No acute hepatobiliary findings on CT.  Patient was treated with IV fluids, morphine, and Zofran in the emergency department, COVID-19 screening test is in process, and hospitalist consulted for admission.  Review of Systems:  All other systems reviewed and  apart from HPI, are negative.  Past Medical History:  Diagnosis Date  . Angio-edema   . Arthritis   . CAD (coronary artery disease)    a. s/p NSTEMI in 10/2017 with cath showing LM disease --> s/p CABG on 11/23/2017 with LIMA-LAD and SVG-OM.   [name] Hay fever   . Ischemic cardiomyopathy    a. EF 35-40% by echo in 10/2017  . NSTEMI (non-ST elevated myocardial infarction) ([place])   . PAF (paroxysmal atrial fibrillation) ([place])    Diagnosed by ILR  . Pneumonia 11/16/2017  . Stroke [facility]) 08/23/2017   Left MCA infarct status post TPA and thrombectomy  . Urticaria     Past Surgical History:  Procedure Laterality Date  . CORONARY ARTERY BYPASS GRAFT N/A 11/23/2017   Procedure: CORONARY ARTERY BYPASS GRAFTING (CABG) x 2, ON PUMP, USING LEFT INTERNAL MAMMARY ARTERY AND RIGHT GREATER SAPHENOUS VEIN HARVESTED ENDOSCOPICALLY;  Surgeon: Gaye Pollack, MD;  Location: [place];  Service: Open Heart Surgery;  Laterality: N/A;  . CRYOABLATION     cervical  . IR ANGIO INTRA EXTRACRAN SEL COM CAROTID INNOMINATE UNI R MOD SED  08/21/2017  . IR ANGIO VERTEBRAL SEL SUBCLAVIAN INNOMINATE UNI R MOD SED  08/21/2017  . IR PERCUTANEOUS ART THROMBECTOMY/INFUSION INTRACRANIAL INC DIAG ANGIO  08/21/2017  . IR RADIOLOGIST EVAL & MGMT  10/30/2017  . LOOP RECORDER INSERTION N/A 08/28/2017   Procedure: LOOP RECORDER INSERTION;  Surgeon: Constance Haw, MD;  Location: [place] CV LAB;  Service: Cardiovascular;  Laterality: N/A;  . RADIOLOGY WITH ANESTHESIA N/A 08/21/2017   Procedure: RADIOLOGY WITH ANESTHESIA;  Surgeon: Estanislado Pandy,  Willaim Rayas, MD;  Location: [place];  Service: Radiology;  Laterality: N/A;  . RIGHT/LEFT HEART CATH AND CORONARY ANGIOGRAPHY N/A 11/20/2017   Procedure: RIGHT/LEFT HEART CATH AND CORONARY ANGIOGRAPHY;  Surgeon: Jettie Booze, MD;  Location: [place] CV LAB;  Service: Cardiovascular;  Laterality: N/A;  . TEE WITHOUT CARDIOVERSION N/A 08/28/2017   Procedure: TRANSESOPHAGEAL ECHOCARDIOGRAM  (TEE);  Surgeon: Fay Records, MD;  Location: [place];  Service: Cardiovascular;  Laterality: N/A;  . TEE WITHOUT CARDIOVERSION N/A 11/23/2017   Procedure: TRANSESOPHAGEAL ECHOCARDIOGRAM (TEE);  Surgeon: Gaye Pollack, MD;  Location: [place];  Service: Open Heart Surgery;  Laterality: N/A;  . TUBAL LIGATION       reports that she quit smoking about 14 months ago. Her smoking use included cigarettes. She has a 3.70 pack-year smoking history. She has never used smokeless tobacco. She reports that she does not drink alcohol or use drugs.  Allergies  Allergen Reactions  . Lopressor [Metoprolol Tartrate] Swelling    Angioedema  . Allegra Allergy [Fexofenadine Hcl] Hives  . Dilaudid [Hydromorphone]     Hallucinations  . Nsaids     On Xarelto  . Gabapentin (Once-Daily) Rash  . Lipitor [Atorvastatin] Diarrhea    Family History  Problem Relation Age of Onset  . Heart attack Father   . Hypertension Mother   . Hyperlipidemia Mother   . Diabetes type II Mother   . Urticaria Mother   . Hypertension Brother   . Breast cancer Cousin 79  . Immunodeficiency Neg Hx   . Eczema Neg Hx   . Atopy Neg Hx   . Asthma Neg Hx   . Angioedema Neg Hx   . Allergic rhinitis Neg Hx   . Colon cancer Neg Hx   . Esophageal cancer Neg Hx      Prior to Admission medications   Medication Sig Start Date End Date Taking? Authorizing Provider  acetaminophen (TYLENOL) 325 MG tablet Take 2 tablets (650 mg total) by mouth every 6 (six) hours as needed for mild pain or moderate pain (or Fever >/= 101). 09/13/19   Rai, Ripudeep K, MD  azelastine (ASTELIN) 0.1 % nasal spray Place 1-2 sprays into both nostrils 2 (two) times daily as needed for rhinitis. Use in each nostril as directed 09/17/19   Bobbitt, Sedalia Muta, MD  Azelastine-Fluticasone 137-50 MCG/ACT SUSP Place 1 spray into both nostrils 2 (two) times daily as needed. 07/09/19   Bobbitt, Sedalia Muta, MD  baclofen (LIORESAL) 10 MG tablet TAKE 1 TABLET BY  MOUTH THREE TIMES DAILY 09/25/19   Jamse Arn, MD  calcium carbonate (TUMS - DOSED IN MG ELEMENTAL CALCIUM) 500 MG chewable tablet Chew 1 tablet by mouth 2 (two) times daily.     [provider]  carvedilol (COREG) 3.125 MG tablet Take 1 tablet by mouth twice daily 09/02/19   Satira Sark, MD  cholecalciferol (VITAMIN D) 1000 units tablet Take 1,000 Units by mouth daily.    [provider]  famotidine (PEPCID) 20 MG tablet Take 20 mg by mouth 2 (two) times daily.    [provider]  ferrous sulfate 325 (65 FE) MG tablet Take 325 mg by mouth daily with breakfast. Taking twice weekly    [provider]  furosemide (LASIX) 20 MG tablet Take 0.5 tablets (10 mg total) by mouth daily. 10/11/19   [name], [name], DO  hydrOXYzine (ATARAX/VISTARIL) 10 MG tablet Take 1 tablet (10 mg total) by mouth at bedtime. Patient not  taking: Reported on 10/11/2019 07/01/19   Howard Pouch A, DO  levocetirizine (XYZAL) 5 MG tablet Take 1 tablet (5 mg total) by mouth every evening. Patient not taking: Reported on 10/11/2019 09/17/19   Bobbitt, Sedalia Muta, MD  loratadine (CLARITIN) 10 MG tablet Take 1 tablet (10 mg total) by mouth daily. Patient not taking: Reported on 10/11/2019 06/25/19   Carollee Herter, Alferd Apa, DO  ondansetron (ZOFRAN ODT) 4 MG disintegrating tablet Take 1 tablet (4 mg total) by mouth every 8 (eight) hours as needed for nausea or vomiting. 09/13/19   Rai, Vernelle Emerald, MD  Potassium Chloride ER 20 MEQ TBCR Take 1 tablet by mouth daily. 09/14/19   Rai, Vernelle Emerald, MD  predniSONE (DELTASONE) 20 MG tablet 60 mg x3d, 40 mg x3d, 20 mg x2d, 10 mg x2d Patient not taking: Reported on 10/11/2019 09/16/19   Howard Pouch A, DO  rivaroxaban (XARELTO) 20 MG TABS tablet Take 1 tablet daily with evening meal 09/25/19   Satira Sark, MD  sacubitril-valsartan (ENTRESTO) 24-26 MG Take 1 tablet by mouth 2 (two) times daily. 09/15/19   Rai, Vernelle Emerald, MD  triamcinolone cream  (KENALOG) 0.1 % Apply 1 application topically 2 (two) times daily. Patient taking differently: Apply 1 application topically 2 (two) times daily as needed (rash).  04/18/18   [name], [name], DO  vitamin B-12 (CYANOCOBALAMIN) 100 MCG tablet Take 100 mcg by mouth daily.    [provider]    Physical Exam: Vitals:   10/11/19 1725  BP: 122/78  Pulse: (!) 111  Resp: 20  Temp: 98 F (36.7 C)  SpO2: 97%    Constitutional: NAD, calm  Eyes: PERTLA, lids and conjunctivae normal ENMT: Mucous membranes are moist. Posterior pharynx clear of any exudate or lesions.   Neck: normal, supple, no masses, no thyromegaly Respiratory:  no wheezing, no crackles. Normal respiratory effort. No accessory muscle use.  Cardiovascular: Rate ~110 and regular. No extremity edema.  Abdomen: Soft, tender across upper abdomen, no rebound pain or guarding. Bowel sounds active.  Musculoskeletal: no clubbing / cyanosis. No joint deformity upper and lower extremities.    Skin: no significant rashes, lesions, ulcers. Warm, dry, well-perfused. Neurologic: No gross facial asymmetry, no dysarthria. Sensation intact. Right UE and LE weakness.  Psychiatric: Alert, appropriate throughout interview and exam. Very pleasant, cooperative.      Labs on Admission: I have personally reviewed following labs and imaging studies  CBC: Recent Labs  Lab 10/11/19 1231  WBC 10.1  NEUTROABS 6.2  HGB 15.4*  HCT 46.2*  MCV 96.0  PLT 123456*   Basic Metabolic Panel: Recent Labs  Lab 10/11/19 1231  NA 137  K 4.4  CL 100  CO2 28  GLUCOSE 79  BUN 14  CREATININE 0.77  CALCIUM 10.5   GFR: Estimated Creatinine Clearance: 57.1 mL/min (by C-G formula based on SCr of 0.77 mg/dL). Liver Function Tests: Recent Labs  Lab 10/11/19 1231  AST 16  ALT 10  ALKPHOS 73  BILITOT 0.5  PROT 7.7  ALBUMIN 4.3   Recent Labs  Lab 10/11/19 1231  LIPASE 2,834.0*  AMYLASE 677*   No results for input(s): AMMONIA in the last  168 hours. Coagulation Profile: No results for input(s): INR, PROTIME in the last 168 hours. Cardiac Enzymes: No results for input(s): CKTOTAL, CKMB, CKMBINDEX, TROPONINI in the last 168 hours. BNP (last 3 results) No results for input(s): PROBNP in the last 8760 hours. HbA1C: No results for input(s): HGBA1C in  the last 72 hours. CBG: No results for input(s): GLUCAP in the last 168 hours. Lipid Profile: No results for input(s): CHOL, HDL, LDLCALC, TRIG, CHOLHDL, LDLDIRECT in the last 72 hours. Thyroid Function Tests: No results for input(s): TSH, T4TOTAL, FREET4, T3FREE, THYROIDAB in the last 72 hours. Anemia Panel: No results for input(s): VITAMINB12, FOLATE, FERRITIN, TIBC, IRON, RETICCTPCT in the last 72 hours. Urine analysis:    Component Value Date/Time   COLORURINE YELLOW 09/10/2019 1302   APPEARANCEUR CLEAR 09/10/2019 1302   LABSPEC 1.030 09/10/2019 1302   PHURINE 5.0 09/10/2019 1302   GLUCOSEU NEGATIVE 09/10/2019 1302   HGBUR SMALL (A) 09/10/2019 1302   BILIRUBINUR Negative 09/18/2019 1137   KETONESUR 80 (A) 09/10/2019 1302   PROTEINUR Negative 09/18/2019 1137   PROTEINUR NEGATIVE 09/10/2019 1302   UROBILINOGEN negative (A) 09/18/2019 1137   NITRITE negative 09/18/2019 1137   NITRITE NEGATIVE 09/10/2019 1302   LEUKOCYTESUR Negative 09/18/2019 1137   LEUKOCYTESUR NEGATIVE 09/10/2019 1302   Sepsis Labs: @LABRCNTIP (procalcitonin:4,lacticidven:4) )No results found for this or any previous visit (from the past 240 hour(s)).   Radiological Exams on Admission: No results found.  Assessment/Plan   1. Acute pancreatitis  - Presents with recurrent severe upper abdominal pain with lipase 2834 and amylase 677 on outpatient labs, CT in ED with slight improvement in pancreatic fluid collection from last month  - She denies EtOH, triglycerides were normal last month, transaminases and bilirubin normal with no stones or hepatobiliary abnormality on CT  - Continue bowel rest,  IV fluid-resuscitation, pain-control    2. Paroxysmal atrial fibrillation  - CHADS-VASc 5 (gender, CVA x2, CAD, CHF)  - Continue Xarelto    3. History of CVA  - Chronic right-sided motor deficits present  - Continue Xarelto   4. Chronic diastolic CHF  - Appears compensated  - EF 50-55% in November 2020  - Hold diuretic while hydrating, follow daily weights     DVT prophylaxis: Xarelto  Code Status: Full  Family Communication: Discussed with patient  Consults called: None  Admission status: Inpatient     Vianne Bulls, MD [facility] Pager [phone]  If 7PM-7AM, please contact night-coverage [email or web address] Password [facility]  10/11/2019, 8:35 PM

## 2019-10-11 NOTE — ED Provider Notes (Signed)
[facility] DEPT Provider Note   CSN: [id number] Arrival date & time: 10/11/19  1720     History Chief Complaint  Patient presents with  . Abdominal Pain  . Abnormal Lab    [name] is a 59 y.o. female.  She has a history of coronary disease paroxysmal A. fib and had a recent admission last month for pancreatitis thought to be idiopathic.  She said she was doing okay after discharge but within the next 2 or 3 days started back with intermittent abdominal pain.  She saw [facility] today and had lab work done that showed her lipase was elevated above 2000.  She is complaining of severe midepigastric abdominal pain with associated nausea.  No vomiting hematemesis or blood from below.  Drinking a lot and not urinating very much.  Afraid to eat because it causes pain.  No fever cough shortness of breath.  The history is provided by the patient.  Abdominal Pain Pain location:  Epigastric Pain quality: aching and bloating   Pain radiates to:  Back Pain severity:  Moderate Onset quality:  Gradual Timing:  Intermittent Progression:  Worsening Chronicity:  Recurrent Context: not alcohol use, not recent travel and not sick contacts   Relieved by:  Nothing Worsened by:  Eating Ineffective treatments:  OTC medications Associated symptoms: constipation and nausea   Associated symptoms: no chest pain, no cough, no dysuria, no fever, no hematemesis, no hematochezia, no hematuria, no shortness of breath, no sore throat and no vomiting   Risk factors: no alcohol abuse   Abnormal Lab      Past Medical History:  Diagnosis Date  . Angio-edema   . Arthritis   . CAD (coronary artery disease)    a. s/p NSTEMI in 10/2017 with cath showing LM disease --> s/p CABG on 11/23/2017 with LIMA-LAD and SVG-OM.   [name] Hay fever   . Ischemic cardiomyopathy    a. EF 35-40% by echo in 10/2017  . NSTEMI (non-ST elevated myocardial infarction) ([place])   . PAF (paroxysmal  atrial fibrillation) ([place])    Diagnosed by ILR  . Pneumonia 11/16/2017  . Stroke [facility]) 08/23/2017   Left MCA infarct status post TPA and thrombectomy  . Urticaria     Patient Active Problem List   Diagnosis Date Noted  . Gastritis 09/17/2019  . Pancreatic cyst 09/11/2019  . AAA (abdominal aortic aneurysm) ([place]), infrarenal - ulcerative plaque (3.1cm)- 09/10/2019 09/11/2019  . Acute pancreatitis 09/10/2019  . Abnormality of gait 08/27/2019  . Chronic rhinitis 07/09/2019  . Allergic reaction 07/09/2019  . 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 Carpal tunnel syndrome of left wrist 03/15/2019  . Anxiety 10/24/2018  . Numbness of left hand 10/19/2018  . Spastic hemiparesis affecting dominant side ([place]) 09/20/2018  . Primary osteoarthritis of both knees 07/04/2018  . Iron deficiency 04/23/2018  . Atherosclerosis of aorta ([place]) 02/02/2018  . Osteoarthritis of left knee 01/16/2018  . Osteoarthritis of right knee 01/16/2018  . Sequela of cerebrovascular accident 01/16/2018  . Paroxysmal atrial fibrillation ([place]) 12/19/2017  . Chronic combined systolic and diastolic CHF (congestive heart failure) ([place]) 12/16/2017  . Benign essential HTN   . Supplemental oxygen dependent   . 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 Coronary artery disease 11/23/2017  . NSTEMI (non-ST elevated myocardial infarction) ([place]) 11/16/2017  . Recurrent urticaria 10/13/2017  . Former smoker 10/13/2017  . Angioedema   . 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 Cerebrovascular accident (CVA)  due to occlusion of left middle cerebral artery ([place])   . Right hemiplegia ([place])   . Dysphagia, post-stroke   . Hyperlipidemia LDL goal <70     Past Surgical History:  Procedure Laterality Date  . CORONARY ARTERY BYPASS GRAFT N/A 11/23/2017   Procedure: CORONARY ARTERY BYPASS GRAFTING (CABG) x 2, ON PUMP, USING LEFT INTERNAL MAMMARY ARTERY AND RIGHT GREATER SAPHENOUS VEIN HARVESTED ENDOSCOPICALLY;  Surgeon: Gaye Pollack, MD;  Location: [facility];  Service: Open Heart Surgery;  Laterality: N/A;  . CRYOABLATION     cervical  . IR ANGIO INTRA EXTRACRAN SEL COM CAROTID INNOMINATE UNI R MOD SED  08/21/2017  . IR ANGIO VERTEBRAL SEL SUBCLAVIAN INNOMINATE UNI R MOD SED  08/21/2017  . IR PERCUTANEOUS ART THROMBECTOMY/INFUSION INTRACRANIAL INC DIAG ANGIO  08/21/2017  . IR RADIOLOGIST EVAL & MGMT  10/30/2017  . LOOP RECORDER INSERTION N/A 08/28/2017   Procedure: LOOP RECORDER INSERTION;  Surgeon: Constance Haw, MD;  Location: [facility];  Service: Cardiovascular;  Laterality: N/A;  . RADIOLOGY WITH ANESTHESIA N/A 08/21/2017   Procedure: RADIOLOGY WITH ANESTHESIA;  Surgeon: Luanne Bras, MD;  Location: [facility];  Service: Radiology;  Laterality: N/A;  . RIGHT/LEFT HEART CATH AND CORONARY ANGIOGRAPHY N/A 11/20/2017   Procedure: RIGHT/LEFT HEART CATH AND CORONARY ANGIOGRAPHY;  Surgeon: Jettie Booze, MD;  Location: [facility];  Service: Cardiovascular;  Laterality: N/A;  . TEE WITHOUT CARDIOVERSION N/A 08/28/2017   Procedure: TRANSESOPHAGEAL ECHOCARDIOGRAM (TEE);  Surgeon: Fay Records, MD;  Location: [facility];  Service: Cardiovascular;  Laterality: N/A;  . TEE WITHOUT CARDIOVERSION N/A 11/23/2017   Procedure: TRANSESOPHAGEAL ECHOCARDIOGRAM (TEE);  Surgeon: Gaye Pollack, MD;  Location: [facility];  Service: Open Heart Surgery;  Laterality: N/A;  . TUBAL LIGATION       OB History    Gravida  2   Para  2   Term      Preterm      AB      Living  2     SAB      TAB      Ectopic      Multiple      Live Births              Family History  Problem Relation Age of Onset  . Heart attack Father   . Hypertension Mother   . Hyperlipidemia Mother   . Diabetes type II Mother   . Urticaria Mother   . Hypertension Brother   . Breast cancer Cousin 33  . Immunodeficiency Neg Hx   . Eczema Neg Hx   . Atopy Neg Hx   . Asthma Neg Hx   . Angioedema Neg Hx   . Allergic rhinitis  Neg Hx   . Colon cancer Neg Hx   . Esophageal cancer Neg Hx     Social History   Tobacco Use  . Smoking status: Former Smoker    Packs/day: 0.10    Years: 37.00    Pack years: 3.70    Types: Cigarettes    Quit date: 07/17/2018    Years since quitting: 1.2  . Smokeless tobacco: Never Used  Substance Use Topics  . Alcohol use: No  . Drug use: No    Home Medications Prior to Admission medications   Medication Sig Start Date End Date Taking? Authorizing Provider  acetaminophen (TYLENOL) 325 MG tablet Take 2 tablets (650 mg total) by mouth every 6 (six) hours as  needed for mild pain or moderate pain (or Fever >/= 101). 09/13/19   Rai, Ripudeep K, MD  azelastine (ASTELIN) 0.1 % nasal spray Place 1-2 sprays into both nostrils 2 (two) times daily as needed for rhinitis. Use in each nostril as directed 09/17/19   Bobbitt, Sedalia Muta, MD  Azelastine-Fluticasone 137-50 MCG/ACT SUSP Place 1 spray into both nostrils 2 (two) times daily as needed. 07/09/19   Bobbitt, Sedalia Muta, MD  baclofen (LIORESAL) 10 MG tablet TAKE 1 TABLET BY MOUTH THREE TIMES DAILY 09/25/19   Jamse Arn, MD  calcium carbonate (TUMS - DOSED IN MG ELEMENTAL CALCIUM) 500 MG chewable tablet Chew 1 tablet by mouth 2 (two) times daily.     [provider]  carvedilol (COREG) 3.125 MG tablet Take 1 tablet by mouth twice daily 09/02/19   Satira Sark, MD  cholecalciferol (VITAMIN D) 1000 units tablet Take 1,000 Units by mouth daily.    [provider]  famotidine (PEPCID) 20 MG tablet Take 20 mg by mouth 2 (two) times daily.    [provider]  ferrous sulfate 325 (65 FE) MG tablet Take 325 mg by mouth daily with breakfast. Taking twice weekly    [provider]  furosemide (LASIX) 20 MG tablet Take 0.5 tablets (10 mg total) by mouth daily. 10/11/19   [name], [name], DO  hydrOXYzine (ATARAX/VISTARIL) 10 MG tablet Take 1 tablet (10 mg total) by mouth at bedtime. Patient not  taking: Reported on 10/11/2019 07/01/19   Howard Pouch A, DO  levocetirizine (XYZAL) 5 MG tablet Take 1 tablet (5 mg total) by mouth every evening. Patient not taking: Reported on 10/11/2019 09/17/19   Bobbitt, Sedalia Muta, MD  loratadine (CLARITIN) 10 MG tablet Take 1 tablet (10 mg total) by mouth daily. Patient not taking: Reported on 10/11/2019 06/25/19   Carollee Herter, Alferd Apa, DO  ondansetron (ZOFRAN ODT) 4 MG disintegrating tablet Take 1 tablet (4 mg total) by mouth every 8 (eight) hours as needed for nausea or vomiting. 09/13/19   Rai, Vernelle Emerald, MD  Potassium Chloride ER 20 MEQ TBCR Take 1 tablet by mouth daily. 09/14/19   Rai, Vernelle Emerald, MD  predniSONE (DELTASONE) 20 MG tablet 60 mg x3d, 40 mg x3d, 20 mg x2d, 10 mg x2d Patient not taking: Reported on 10/11/2019 09/16/19   Howard Pouch A, DO  rivaroxaban (XARELTO) 20 MG TABS tablet Take 1 tablet daily with evening meal 09/25/19   Satira Sark, MD  sacubitril-valsartan (ENTRESTO) 24-26 MG Take 1 tablet by mouth 2 (two) times daily. 09/15/19   Rai, Vernelle Emerald, MD  triamcinolone cream (KENALOG) 0.1 % Apply 1 application topically 2 (two) times daily. Patient taking differently: Apply 1 application topically 2 (two) times daily as needed (rash).  04/18/18   [name], [name], DO  vitamin B-12 (CYANOCOBALAMIN) 100 MCG tablet Take 100 mcg by mouth daily.    [provider]    Allergies    Lopressor [metoprolol tartrate], Allegra allergy [fexofenadine hcl], Dilaudid [hydromorphone], Nsaids, Gabapentin (once-daily), and Lipitor [atorvastatin]  Review of Systems   Review of Systems  Constitutional: Positive for appetite change. Negative for fever.  HENT: Negative for sore throat.   Eyes: Negative for visual disturbance.  Respiratory: Negative for cough and shortness of breath.   Cardiovascular: Negative for chest pain.  Gastrointestinal: Positive for abdominal pain, constipation and nausea. Negative for hematemesis, hematochezia  and vomiting.  Genitourinary: Negative for dysuria and hematuria.  Musculoskeletal: Negative for neck  pain.  Skin: Negative for rash.  Neurological: Negative for headaches.    Physical Exam Updated Vital Signs BP 122/78   Pulse (!) 111   Temp 98 F (36.7 C)   Resp 20   LMP  (LMP Unknown)   SpO2 97%   Physical Exam Vitals and nursing note reviewed.  Constitutional:      General: She is not in acute distress.    Appearance: She is well-developed.  HENT:     Head: Normocephalic and atraumatic.  Eyes:     Conjunctiva/sclera: Conjunctivae normal.  Cardiovascular:     Rate and Rhythm: Normal rate and regular rhythm.     Heart sounds: No murmur.  Pulmonary:     Effort: Pulmonary effort is normal. No respiratory distress.     Breath sounds: Normal breath sounds.  Abdominal:     Palpations: Abdomen is soft.     Tenderness: There is abdominal tenderness in the epigastric area. There is no guarding or rebound.  Musculoskeletal:        General: Normal range of motion.     Cervical back: Neck supple.     Right lower leg: No edema.     Left lower leg: No edema.  Skin:    General: Skin is warm and dry.     Capillary Refill: Capillary refill takes less than 2 seconds.  Neurological:     General: No focal deficit present.     Mental Status: She is alert.     ED Results / Procedures / Treatments   Labs (all labs ordered are listed, but only abnormal results are displayed) Labs Reviewed  URINALYSIS, ROUTINE W REFLEX MICROSCOPIC - Abnormal; Notable for the following components:      Result Value   Specific Gravity, Urine >1.046 (*)    Ketones, ur 20 (*)    All other components within normal limits  COMPREHENSIVE METABOLIC PANEL - Abnormal; Notable for the following components:   Calcium 8.7 (*)    Total Protein 6.4 (*)    Albumin 3.3 (*)    AST 14 (*)    All other components within normal limits  SARS CORONAVIRUS 2 (TAT 6-24 HRS)  CBC    EKG None  Radiology CT  Abdomen Pelvis W Contrast  Result Date: 10/11/2019 CLINICAL DATA:  Abdominal distension. Elevated lipase. Referred by primary care physician for possible pancreatitis. Abdominal pain and nausea. EXAM: CT ABDOMEN AND PELVIS WITH CONTRAST TECHNIQUE: Multidetector CT imaging of the abdomen and pelvis was performed using the standard protocol following bolus administration of intravenous contrast. CONTRAST:  126mL OMNIPAQUE IOHEXOL 300 MG/ML  SOLN COMPARISON:  CT 09/10/2019 FINDINGS: Lower chest: Minor hypoventilatory changes and left lower lobe scarring. No pleural effusion or acute airspace disease. Hepatobiliary: No focal liver abnormality is seen. No gallstones, gallbladder wall thickening, or biliary dilatation. Pancreas: Mild peripancreatic fat stranding is most prominent about the head and uncinate process, to a lesser extent pancreatic body. Cystic lesion within the pancreatic head measures 1.8 x 1.7 x 1.8 cm, previously 2.2 x 1.9 x 2.0 cm. No new peripancreatic fluid collection. Mild prominence of the pack small pancreatic duct at 4 mm. No pancreatic calcifications. Spleen: Normal in size without focal abnormality. The splenic vein is patent. Adrenals/Urinary Tract: Left adrenal thickening without dominant nodule. Right adrenal gland is normal. 6 mm stone in the right proximal ureter without associated hydronephrosis, stone unchanged in position from prior exam. No perinephric edema. There is symmetric excretion on delayed phase imaging. Small  low-density lesions within both kidneys are too small to characterize but likely cysts. Urinary bladder is partially distended. No bladder stone or wall thickening. Mild pelvic floor descent with small cystocele. Stomach/Bowel: Wall thickening of the pre pyloric stomach and proximal duodenum, may be in part reactive. Stomach is distended. Small bowel is otherwise decompressed. No obstruction. Normal appendix. Mild colonic diverticulosis without diverticulitis. Small  volume of colonic stool. Vascular/Lymphatic: Infrarenal aortic aneurysm at 3.1 cm with mural thrombus and ulcerated plaque. No periaortic stranding. Moderate aortic atherosclerosis. The portal and splenic veins are patent. Mesenteric vessels are grossly patent. Dilatation of the ovarian veins with contrast refluxing, left greater than right. Left ovarian vein measures 7 mm. Small peripancreatic nodes, largest measuring 10 mm, likely reactive. Reproductive: Prominent periuterine and adnexal vascularity. Uterus otherwise quiescent. No suspicious adnexal mass. Other: Mild peripancreatic edema tracks in the central mesentery. No significant ascites. No free air. Musculoskeletal: There are no acute or suspicious osseous abnormalities. Mild degenerative change in the spine. IMPRESSION: 1. Acute pancreatitis. Cystic lesion in the pancreatic head measures 1.8 x 1.7 x 1.8 cm, previously 2.2 x 1.9 x 2.0 cm on CT 1 month ago, favoring pseudocyst with slight improvement. 2. Wall thickening of the pre pyloric stomach and proximal duodenum, may be in part reactive. 3. Infrarenal aortic aneurysm measuring 3.1 cm. Mural thrombus with ulcerated plaque, unchanged from recent prior exam. 4. Dilatation of the ovarian veins with contrast refluxing, left greater than right. This can be seen with pelvic congestion syndrome in the appropriate clinical setting. 5. Right proximal ureteral calculus, unchanged in position from prior exam. This is likely nonobstructing, as there is no hydronephrosis in symmetric excretion on delayed phase imaging. 6. Colonic diverticulosis without diverticulitis. Aortic Atherosclerosis (ICD10-I70.0). Aortic aneurysm NOS (ICD10-I71.9). Electronically Signed   By: Keith Rake M.D.   On: 10/11/2019 20:37    Procedures Procedures (including critical care time)  Medications Ordered in ED Medications  ondansetron (ZOFRAN) injection 4 mg (has no administration in time range)  morphine 4 MG/ML injection 4  mg (has no administration in time range)    ED Course  I have reviewed the triage vital signs and the nursing notes.  Pertinent labs & imaging results that were available during my care of the patient were reviewed by me and considered in my medical decision making (see chart for details).  Clinical Course as of Oct 11 1022  Fri Oct 11, 2019  1934 Patient here with epigastric abdominal pain in the setting of having recent pancreatitis.  Differential includes obstructive pancreatitis, idiopathic pancreatitis, pancreatic pseudocyst.   [MB]  1934 She had labs done earlier today showing an elevated white count of 14 elevated hemoglobin and platelets.  Chemistries and LFTs were normal.  Lipase was greater than 2000.   [MB]  1935 Have ordered her Covid testing urinalysis CT abdomen pelvis that was requested by GI and ordered her some IV fluids nausea medicine and pain meds   [MB]  1935 Discussed with Dr. Myna Hidalgo from [facility] hospitalist to evaluate the patient for admission.   [MB]    Clinical Course User Index [MB] Hayden Rasmussen, MD   MDM Rules/Calculators/A&P                       Final Clinical Impression(s) / ED Diagnoses Final diagnoses:  Acute pancreatitis, unspecified complication status, unspecified pancreatitis type    Rx / DC Orders ED Discharge Orders    None  Hayden Rasmussen, MD 10/12/19 1024

## 2019-10-11 NOTE — Patient Instructions (Signed)
Your provider has requested that you go to the basement level for lab work at [facility] in [place] [address]. Press "B" on the elevator. The lab is located at the first door on the left as you exit the elevator.  It was a pleasure to see you today!  Vito Cirigliano, D.O.

## 2019-10-11 NOTE — ED Notes (Signed)
ED TO INPATIENT HANDOFF REPORT  ED Nurse Name and Phone #: Fredonia Highland [phone]  S Name/Age/Gender [name] 59 y.o. female Room/Bed: WA08/WA08  Code Status   Code Status: Prior  Home/SNF/Other Home Patient oriented to: self, place, time and situation Is this baseline? yes  Triage Complete: Triage complete  Chief Complaint Acute recurrent pancreatitis [K85.90]  Triage Note Pt reports that she was sent by PCP for possible pancreatitis. Having abd pains with nausea since got out of hospital back in December.  Sent for Lipase 2,834.     Allergies Allergies  Allergen Reactions  . Lopressor [Metoprolol Tartrate] Swelling    Angioedema  . Allegra Allergy [Fexofenadine Hcl] Hives  . Dilaudid [Hydromorphone]     Hallucinations  . Nsaids     On Xarelto  . Gabapentin (Once-Daily) Rash  . Lipitor [Atorvastatin] Diarrhea    Level of Care/Admitting Diagnosis ED Disposition    ED Disposition Condition Comment   Admit  Hospital Area: [facility] [[id number]]  Level of Care: Med-Surg [16]  Covid Evaluation: Asymptomatic Screening Protocol (No Symptoms)  Diagnosis: Acute recurrent pancreatitis [088110]  Admitting Physician: Vianne Bulls [[id number]]  Attending Physician: Vianne Bulls [[id number]]  Estimated length of stay: past midnight tomorrow  Certification:: I certify this patient will need inpatient services for at least 2 midnights       B Medical/Surgery History Past Medical History:  Diagnosis Date  . Angio-edema   . Arthritis   . CAD (coronary artery disease)    a. s/p NSTEMI in 10/2017 with cath showing LM disease --> s/p CABG on 11/23/2017 with LIMA-LAD and SVG-OM.   [name] Hay fever   . Ischemic cardiomyopathy    a. EF 35-40% by echo in 10/2017  . NSTEMI (non-ST elevated myocardial infarction) ([place])   . PAF (paroxysmal atrial fibrillation) ([place])    Diagnosed by ILR  . Pneumonia 11/16/2017  . Stroke [facility]) 08/23/2017   Left MCA infarct  status post TPA and thrombectomy  . Urticaria    Past Surgical History:  Procedure Laterality Date  . CORONARY ARTERY BYPASS GRAFT N/A 11/23/2017   Procedure: CORONARY ARTERY BYPASS GRAFTING (CABG) x 2, ON PUMP, USING LEFT INTERNAL MAMMARY ARTERY AND RIGHT GREATER SAPHENOUS VEIN HARVESTED ENDOSCOPICALLY;  Surgeon: Gaye Pollack, MD;  Location: [place];  Service: Open Heart Surgery;  Laterality: N/A;  . CRYOABLATION     cervical  . IR ANGIO INTRA EXTRACRAN SEL COM CAROTID INNOMINATE UNI R MOD SED  08/21/2017  . IR ANGIO VERTEBRAL SEL SUBCLAVIAN INNOMINATE UNI R MOD SED  08/21/2017  . IR PERCUTANEOUS ART THROMBECTOMY/INFUSION INTRACRANIAL INC DIAG ANGIO  08/21/2017  . IR RADIOLOGIST EVAL & MGMT  10/30/2017  . LOOP RECORDER INSERTION N/A 08/28/2017   Procedure: LOOP RECORDER INSERTION;  Surgeon: Constance Haw, MD;  Location: [place] CV LAB;  Service: Cardiovascular;  Laterality: N/A;  . RADIOLOGY WITH ANESTHESIA N/A 08/21/2017   Procedure: RADIOLOGY WITH ANESTHESIA;  Surgeon: Luanne Bras, MD;  Location: [place];  Service: Radiology;  Laterality: N/A;  . RIGHT/LEFT HEART CATH AND CORONARY ANGIOGRAPHY N/A 11/20/2017   Procedure: RIGHT/LEFT HEART CATH AND CORONARY ANGIOGRAPHY;  Surgeon: Jettie Booze, MD;  Location: [place] CV LAB;  Service: Cardiovascular;  Laterality: N/A;  . TEE WITHOUT CARDIOVERSION N/A 08/28/2017   Procedure: TRANSESOPHAGEAL ECHOCARDIOGRAM (TEE);  Surgeon: Fay Records, MD;  Location: [place];  Service: Cardiovascular;  Laterality: N/A;  . TEE WITHOUT CARDIOVERSION N/A 11/23/2017  Procedure: TRANSESOPHAGEAL ECHOCARDIOGRAM (TEE);  Surgeon: Gaye Pollack, MD;  Location: [place];  Service: Open Heart Surgery;  Laterality: N/A;  . TUBAL LIGATION       A IV Location/Drains/Wounds Patient Lines/Drains/Airways Status   Active Line/Drains/Airways    Name:   Placement date:   Placement time:   Site:   Days:   Peripheral IV 10/11/19 Left Antecubital    10/11/19    1955    Antecubital   less than 1   Incision (Closed) 11/23/17 Sternum Other (Comment)   11/23/17    0845     687   Incision (Closed) 11/23/17 Leg Right   11/23/17    0845     687   Wound / Incision (Open or Dehisced) 08/29/17 Incision - Open Groin Anterior;Left small incision site   08/29/17    0955    Groin   773          Intake/Output Last 24 hours No intake or output data in the 24 hours ending 10/11/19 2119  Labs/Imaging Results for orders placed or performed in visit on 10/11/19 (from the past 48 hour(s))  Lipase     Status: Abnormal   Collection Time: 10/11/19 12:31 PM  Result Value Ref Range   Lipase 2,834.0 (H) 11.0 - 59.0 U/L  Amylase     Status: Abnormal   Collection Time: 10/11/19 12:31 PM  Result Value Ref Range   Amylase 677 (H) 27 - 131 U/L  Comp Met (CMET)     Status: None   Collection Time: 10/11/19 12:31 PM  Result Value Ref Range   Sodium 137 135 - 145 mEq/L   Potassium 4.4 3.5 - 5.1 mEq/L   Chloride 100 96 - 112 mEq/L   CO2 28 19 - 32 mEq/L   Glucose, Bld 79 70 - 99 mg/dL   BUN 14 6 - 23 mg/dL   Creatinine, Ser 0.77 0.40 - 1.20 mg/dL   Total Bilirubin 0.5 0.2 - 1.2 mg/dL   Alkaline Phosphatase 73 39 - 117 U/L   AST 16 0 - 37 U/L   ALT 10 0 - 35 U/L   Total Protein 7.7 6.0 - 8.3 g/dL   Albumin 4.3 3.5 - 5.2 g/dL   GFR 76.61 >60.00 mL/min   Calcium 10.5 8.4 - 10.5 mg/dL  CBC w/Diff     Status: Abnormal   Collection Time: 10/11/19 12:31 PM  Result Value Ref Range   WBC 10.1 4.0 - 10.5 K/uL   RBC 4.81 3.87 - 5.11 Mil/uL   Hemoglobin 15.4 (H) 12.0 - 15.0 g/dL   HCT 46.2 (H) 36.0 - 46.0 %   MCV 96.0 78.0 - 100.0 fl   MCHC 33.4 30.0 - 36.0 g/dL   RDW 14.5 11.5 - 15.5 %   Platelets 430.0 (H) 150.0 - 400.0 K/uL   Neutrophils Relative % 61.4 43.0 - 77.0 %   Lymphocytes Relative 26.4 12.0 - 46.0 %   Monocytes Relative 10.0 3.0 - 12.0 %   Eosinophils Relative 1.8 0.0 - 5.0 %   Basophils Relative 0.4 0.0 - 3.0 %   Neutro Abs 6.2 1.4 - 7.7  K/uL   Lymphs Abs 2.7 0.7 - 4.0 K/uL   Monocytes Absolute 1.0 0.1 - 1.0 K/uL   Eosinophils Absolute 0.2 0.0 - 0.7 K/uL   Basophils Absolute 0.0 0.0 - 0.1 K/uL   CT Abdomen Pelvis W Contrast  Result Date: 10/11/2019 CLINICAL DATA:  Abdominal distension. Elevated lipase. Referred by primary  care physician for possible pancreatitis. Abdominal pain and nausea. EXAM: CT ABDOMEN AND PELVIS WITH CONTRAST TECHNIQUE: Multidetector CT imaging of the abdomen and pelvis was performed using the standard protocol following bolus administration of intravenous contrast. CONTRAST:  151m OMNIPAQUE IOHEXOL 300 MG/ML  SOLN COMPARISON:  CT 09/10/2019 FINDINGS: Lower chest: Minor hypoventilatory changes and left lower lobe scarring. No pleural effusion or acute airspace disease. Hepatobiliary: No focal liver abnormality is seen. No gallstones, gallbladder wall thickening, or biliary dilatation. Pancreas: Mild peripancreatic fat stranding is most prominent about the head and uncinate process, to a lesser extent pancreatic body. Cystic lesion within the pancreatic head measures 1.8 x 1.7 x 1.8 cm, previously 2.2 x 1.9 x 2.0 cm. No new peripancreatic fluid collection. Mild prominence of the pack small pancreatic duct at 4 mm. No pancreatic calcifications. Spleen: Normal in size without focal abnormality. The splenic vein is patent. Adrenals/Urinary Tract: Left adrenal thickening without dominant nodule. Right adrenal gland is normal. 6 mm stone in the right proximal ureter without associated hydronephrosis, stone unchanged in position from prior exam. No perinephric edema. There is symmetric excretion on delayed phase imaging. Small low-density lesions within both kidneys are too small to characterize but likely cysts. Urinary bladder is partially distended. No bladder stone or wall thickening. Mild pelvic floor descent with small cystocele. Stomach/Bowel: Wall thickening of the pre pyloric stomach and proximal duodenum, may be in  part reactive. Stomach is distended. Small bowel is otherwise decompressed. No obstruction. Normal appendix. Mild colonic diverticulosis without diverticulitis. Small volume of colonic stool. Vascular/Lymphatic: Infrarenal aortic aneurysm at 3.1 cm with mural thrombus and ulcerated plaque. No periaortic stranding. Moderate aortic atherosclerosis. The portal and splenic veins are patent. Mesenteric vessels are grossly patent. Dilatation of the ovarian veins with contrast refluxing, left greater than right. Left ovarian vein measures 7 mm. Small peripancreatic nodes, largest measuring 10 mm, likely reactive. Reproductive: Prominent periuterine and adnexal vascularity. Uterus otherwise quiescent. No suspicious adnexal mass. Other: Mild peripancreatic edema tracks in the central mesentery. No significant ascites. No free air. Musculoskeletal: There are no acute or suspicious osseous abnormalities. Mild degenerative change in the spine. IMPRESSION: 1. Acute pancreatitis. Cystic lesion in the pancreatic head measures 1.8 x 1.7 x 1.8 cm, previously 2.2 x 1.9 x 2.0 cm on CT 1 month ago, favoring pseudocyst with slight improvement. 2. Wall thickening of the pre pyloric stomach and proximal duodenum, may be in part reactive. 3. Infrarenal aortic aneurysm measuring 3.1 cm. Mural thrombus with ulcerated plaque, unchanged from recent prior exam. 4. Dilatation of the ovarian veins with contrast refluxing, left greater than right. This can be seen with pelvic congestion syndrome in the appropriate clinical setting. 5. Right proximal ureteral calculus, unchanged in position from prior exam. This is likely nonobstructing, as there is no hydronephrosis in symmetric excretion on delayed phase imaging. 6. Colonic diverticulosis without diverticulitis. Aortic Atherosclerosis (ICD10-I70.0). Aortic aneurysm NOS (ICD10-I71.9). Electronically Signed   By: MKeith RakeM.D.   On: 10/11/2019 20:37    Pending Labs Unresulted Labs (From  admission, onward)    Start     Ordered   10/11/19 1926  SARS CORONAVIRUS 2 (TAT 6-24 HRS) Nasopharyngeal Nasopharyngeal Swab  (Tier 3 (TAT 6-24 hrs))  Once,   STAT    Question Answer Comment  Is this test for diagnosis or screening Screening   Symptomatic for COVID-19 as defined by CDC No   Hospitalized for COVID-19 No   Admitted to ICU for COVID-19 No  Previously tested for COVID-19 Yes   Resident in a congregate (group) care setting No   Employed in healthcare setting No   Pregnant No      10/11/19 1926   10/11/19 1924  Urinalysis, Routine w reflex microscopic  (ED Abdominal Pain)  ONCE - STAT,   STAT     10/11/19 1926   Signed and Held  Comprehensive metabolic panel  Tomorrow morning,   R     Signed and Held   Signed and Held  CBC  Tomorrow morning,   R     Signed and Held          Vitals/Pain Today's Vitals   10/11/19 1725 10/11/19 2102  BP: 122/78 (!) 112/59  Pulse: (!) 111 83  Resp: 20 16  Temp: 98 F (36.7 C)   SpO2: 97% 96%  PainSc: 10-Worst pain ever     Isolation Precautions No active isolations  Medications Medications  0.9 %  sodium chloride infusion (has no administration in time range)  fentaNYL (SUBLIMAZE) injection 25-50 mcg (has no administration in time range)  ondansetron (ZOFRAN) injection 4 mg (has no administration in time range)  sodium chloride (PF) 0.9 % injection (has no administration in time range)  ondansetron (ZOFRAN) injection 4 mg (4 mg Intravenous Given 10/11/19 1953)  morphine 4 MG/ML injection 4 mg (4 mg Intravenous Given 10/11/19 1953)  sodium chloride 0.9 % bolus 1,000 mL (1,000 mLs Intravenous New Bag/Given 10/11/19 2002)  iohexol (OMNIPAQUE) 300 MG/ML solution 100 mL (100 mLs Intravenous Contrast Given 10/11/19 2008)    Mobility walks with person assist Low fall risk   Focused Assessments GI   R Recommendations: See Admitting Provider Note  Report given to:   Additional Notes:

## 2019-10-12 LAB — CBC
HCT: 41.8 % (ref 36.0–46.0)
Hemoglobin: 13.5 g/dL (ref 12.0–15.0)
MCH: 32.1 pg (ref 26.0–34.0)
MCHC: 32.3 g/dL (ref 30.0–36.0)
MCV: 99.5 fL (ref 80.0–100.0)
Platelets: 324 10*3/uL (ref 150–400)
RBC: 4.2 MIL/uL (ref 3.87–5.11)
RDW: 14.1 % (ref 11.5–15.5)
WBC: 9.3 10*3/uL (ref 4.0–10.5)
nRBC: 0 % (ref 0.0–0.2)

## 2019-10-12 LAB — COMPREHENSIVE METABOLIC PANEL
ALT: 10 U/L (ref 0–44)
AST: 14 U/L — ABNORMAL LOW (ref 15–41)
Albumin: 3.3 g/dL — ABNORMAL LOW (ref 3.5–5.0)
Alkaline Phosphatase: 57 U/L (ref 38–126)
Anion gap: 7 (ref 5–15)
BUN: 15 mg/dL (ref 6–20)
CO2: 24 mmol/L (ref 22–32)
Calcium: 8.7 mg/dL — ABNORMAL LOW (ref 8.9–10.3)
Chloride: 105 mmol/L (ref 98–111)
Creatinine, Ser: 0.55 mg/dL (ref 0.44–1.00)
GFR calc Af Amer: >60 mL/min (ref >60)
GFR calc non Af Amer: >60 mL/min (ref >60)
Glucose, Bld: 96 mg/dL (ref 70–99)
Potassium: 3.8 mmol/L (ref 3.5–5.1)
Sodium: 136 mmol/L (ref 135–145)
Total Bilirubin: 0.4 mg/dL (ref 0.3–1.2)
Total Protein: 6.4 g/dL — ABNORMAL LOW (ref 6.5–8.1)

## 2019-10-12 LAB — URINALYSIS, ROUTINE W REFLEX MICROSCOPIC
Bilirubin Urine: NEGATIVE
Glucose, UA: NEGATIVE mg/dL
Hgb urine dipstick: NEGATIVE
Ketones, ur: 20 mg/dL — AB
Leukocytes,Ua: NEGATIVE
Nitrite: NEGATIVE
Protein, ur: NEGATIVE mg/dL
Specific Gravity, Urine: >1.046 — ABNORMAL HIGH (ref 1.005–1.030)
pH: 6 (ref 5.0–8.0)

## 2019-10-12 LAB — SARS CORONAVIRUS 2 (TAT 6-24 HRS): SARS Coronavirus 2: NEGATIVE

## 2019-10-12 MED ORDER — METHYLPREDNISOLONE SODIUM SUCC 40 MG IJ SOLR
40.0000 mg | Freq: Every day | INTRAMUSCULAR | Status: DC
  Administered 2019-10-12 – 2019-10-14 (×3): 40 mg via INTRAVENOUS
  Filled 2019-10-12 (×4): qty 1

## 2019-10-12 MED ORDER — ACETAMINOPHEN 650 MG RE SUPP: 650.0000 mg | Freq: Four times a day (QID) | RECTAL | Status: DC | PRN

## 2019-10-12 MED ORDER — ROSUVASTATIN CALCIUM 20 MG PO TABS
20.0000 mg | ORAL_TABLET | Freq: Every day | ORAL | Status: DC
  Administered 2019-10-12 – 2019-10-13 (×2): 20 mg via ORAL
  Filled 2019-10-12 (×3): qty 1

## 2019-10-12 MED ORDER — RIVAROXABAN 20 MG PO TABS
20.0000 mg | ORAL_TABLET | Freq: Every day | ORAL | Status: DC
  Administered 2019-10-12 – 2019-10-14 (×3): 20 mg via ORAL
  Filled 2019-10-12 (×3): qty 1

## 2019-10-12 MED ORDER — SODIUM CHLORIDE 0.9 % IV SOLN: INTRAVENOUS | Status: AC

## 2019-10-12 MED ORDER — PREDNISONE 20 MG PO TABS: 20.0000 mg | ORAL_TABLET | Freq: Every day | ORAL | Status: DC

## 2019-10-12 MED ORDER — ZOLPIDEM TARTRATE 5 MG PO TABS
5.0000 mg | ORAL_TABLET | Freq: Once | ORAL | Status: AC
  Administered 2019-10-12: 23:00:00 5 mg via ORAL
  Filled 2019-10-12: qty 1

## 2019-10-12 MED ORDER — TRIAMCINOLONE ACETONIDE 0.1 % EX CREA
1.0000 "application " | TOPICAL_CREAM | Freq: Two times a day (BID) | CUTANEOUS | Status: DC
  Administered 2019-10-12 – 2019-10-13 (×2): 1 via TOPICAL
  Filled 2019-10-12: qty 15

## 2019-10-12 MED ORDER — ACETAMINOPHEN 325 MG PO TABS: 650.0000 mg | ORAL_TABLET | Freq: Four times a day (QID) | ORAL | Status: DC | PRN

## 2019-10-12 MED ORDER — PANTOPRAZOLE SODIUM 40 MG IV SOLR
40.0000 mg | INTRAVENOUS | Status: DC
  Administered 2019-10-12: 40 mg via INTRAVENOUS
  Filled 2019-10-12: qty 40

## 2019-10-12 MED ORDER — PROMETHAZINE HCL 25 MG PO TABS
12.5000 mg | ORAL_TABLET | Freq: Four times a day (QID) | ORAL | Status: DC | PRN
  Administered 2019-10-14: 08:00:00 12.5 mg via ORAL
  Filled 2019-10-12: qty 1

## 2019-10-12 MED ORDER — ATORVASTATIN CALCIUM 40 MG PO TABS: 40.0000 mg | ORAL_TABLET | Freq: Every day | ORAL | Status: DC

## 2019-10-12 NOTE — Progress Notes (Addendum)
PROGRESS NOTE  [name] [id number] DOB: [birth year] DOA: 10/11/2019 PCP: [name], DO  HPI/Recap of past 24 hours: [name] is a 59 y.o. female with medical history significant for coronary artery disease, history of CVA with right-sided motor deficits, paroxysmal atrial fibrillation on Xarelto, and recent admission for acute pancreatitis, now returning to the ED for evaluation of severe recurrent abdominal pain with marked elevation in lipase on outpatient blood work.  Patient was discharged from the hospital just under a month ago after management of acute idiopathic pancreatitis.  She does not drink alcohol, triglycerides were normal, she had normal transaminases and bilirubin, and a 2.2 cm cystic lesion within the pancreatic head was noted on CT at that time.  She reports that symptoms were completely resolved when she first returned home from the hospital, but she began to develop recurrent upper abdominal pain within a few days of the discharge.  Since the recent discharge, she has also had some gross hematuria and completed 5 days of Bactrim.  She was recently suspected of having autoimmune urticaria, has a prescription for prednisone, but has not taken any.  IgG4 level is pending on outpatient labs.  ED Course: Upon arrival to the ED, patient is found to be afebrile, saturating well on room air, slightly tachycardic, and with stable blood pressure.  Chemistry panel was notable for lipase of 2834 and amylase 677.  CBC with hemoglobin 15.4 and platelets 430,000.  CT the abdomen and pelvis demonstrates slight improvement in likely pancreatic pseudocyst.  No acute hepatobiliary findings on CT.  Patient was treated with IV fluids, morphine, and Zofran in the emergency department, COVID-19 screening test is in process, and hospitalist consulted for admission.  10/12/19: Seen and examined.  Reports abdominal pain 7 out of 10 improved with pain medication.  Also reports  indigestion and itchy rash underneath her breasts.  Was prescribed prednisone but never started.  Will resume.   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 Acute recurrent pancreatitis  - Presents with recurrent severe upper abdominal pain with lipase 2834 and amylase 677 on outpatient labs, CT in ED with slight improvement in pancreatic fluid collection from last month  No gallstone, no biliary dilatation - She denies EtOH, triglycerides were normal last month, transaminases and bilirubin normal with no stones or hepatobiliary abnormality on CT  - Continue bowel rest, continue IV fluid-resuscitation, continue pain-control   Daily lipase level  2. Paroxysmal atrial fibrillation  - CHADS-VASc 5 (gender, CVA x2, CAD, CHF)  In sinus rhythm - Continue Xarelto    3. History of CVA  - Chronic right-sided motor deficits present  - Continue Xarelto  for secondary CVA prevention Start statin due to elevated LDL >110  4. Chronic diastolic CHF  - Appears compensated  - EF 50-55% in November 2020  - Hold diuretic while hydrating, follow daily weights    GERD Start IV Protonix daily  Infrarenal aortic aneurysm measuring 3.1 cm with mural thrombus and ulcerated plaque Continue with Xarelto Last LDL elevated will start statin   Urticaria Home medications on home prednisone 20 mg daily with taper Start IV Solu-Medrol 40 mg daily will switch to oral when symptoms improved Restart home kenalog cream   DVT prophylaxis: Xarelto  Code Status: Full  Family Communication: Discussed with patient  Consults called: None   Disposition: Patient is currently not appropriate for discharge at this time due to persistent abdominal pain in the  setting of acute recurrent pancreatitis.  Patient requires IV fluid hydration.  She requires at least 15 hours/2 midnights for further evaluation and treatment of present condition.   Objective: Vitals:   10/11/19 2102 10/11/19 2209 10/11/19 2210 10/12/19 0508  BP: (!) 112/59 123/76 123/76 113/68  Pulse: 83 91 91 93  Resp: 16 17 17 17   Temp:  97.8 F (36.6 C) 97.8 F (36.6 C) 98 F (36.7 C)  TempSrc:  Oral Oral Oral  SpO2: 96% 91%  91%  Weight:   54.2 kg   Height:   5\' 1"  (1.549 m)     Intake/Output Summary (Last 24 hours) at 10/12/2019 1125 Last data filed at 10/12/2019 [id number] Gross per 24 hour  Intake --  Output 500 ml  Net -500 ml   Filed Weights   10/11/19 2210  Weight: 54.2 kg    Exam:  . General: 59 y.o. year-old female well developed well nourished in no acute distress.  Alert and oriented x3. . Cardiovascular: Regular rate and rhythm with no rubs or gallops.  No thyromegaly or JVD noted.   [name] Respiratory: Clear to auscultation with no wheezes or rales. Good inspiratory effort. . Abdomen: Soft diffuse tenderness with bowel sounds present.   . Musculoskeletal: No lower extremity edema. 2/4 pulses in all 4 extremities. [name] Psychiatry: Mood is appropriate for condition and setting   Data Reviewed: CBC: Recent Labs  Lab 10/11/19 1231 10/12/19 0612  WBC 10.1 9.3  NEUTROABS 6.2  --   HGB 15.4* 13.5  HCT 46.2* 41.8  MCV 96.0 99.5  PLT 430.0* 0000000   Basic Metabolic Panel: Recent Labs  Lab 10/11/19 1231 10/12/19 0612  NA 137 136  K 4.4 3.8  CL 100 105  CO2 28 24  GLUCOSE 79 96  BUN 14 15  CREATININE 0.77 0.55  CALCIUM 10.5 8.7*   GFR: Estimated Creatinine Clearance: 57.1 mL/min (by C-G formula based on SCr of 0.55 mg/dL). Liver Function Tests: Recent Labs  Lab 10/11/19 1231 10/12/19 0612  AST 16 14*  ALT 10 10  ALKPHOS 73 57  BILITOT 0.5 0.4  PROT 7.7 6.4*  ALBUMIN 4.3 3.3*   Recent Labs  Lab 10/11/19 1231  LIPASE 2,834.0*  AMYLASE 677*    No results for input(s): AMMONIA in the last 168 hours. Coagulation Profile: No results for input(s): INR, PROTIME in the last 168 hours. Cardiac Enzymes: No results for input(s): CKTOTAL, CKMB, CKMBINDEX, TROPONINI in the last 168 hours. BNP (last 3 results) No results for input(s): PROBNP in the last 8760 hours. HbA1C: No results for input(s): HGBA1C in the last 72 hours. CBG: No results for input(s): GLUCAP in the last 168 hours. Lipid Profile: No results for input(s): CHOL, HDL, LDLCALC, TRIG, CHOLHDL, LDLDIRECT in the last 72 hours. Thyroid Function Tests: No results for input(s): TSH, T4TOTAL, FREET4, T3FREE, THYROIDAB in the last 72 hours. Anemia Panel: No results for input(s): VITAMINB12, FOLATE, FERRITIN, TIBC, IRON, RETICCTPCT in the last 72 hours. Urine analysis:    Component Value Date/Time   COLORURINE YELLOW 10/11/2019 1924   APPEARANCEUR CLEAR 10/11/2019 1924   LABSPEC >1.046 (H) 10/11/2019 1924   PHURINE 6.0 10/11/2019 1924   GLUCOSEU NEGATIVE 10/11/2019 [place] NEGATIVE 10/11/2019 [place] NEGATIVE 10/11/2019 1924  BILIRUBINUR Negative 09/18/2019 1137   KETONESUR 20 (A) 10/11/2019 [place] 10/11/2019 1924   UROBILINOGEN negative (A) 09/18/2019 1137   NITRITE NEGATIVE 10/11/2019 1924   LEUKOCYTESUR NEGATIVE 10/11/2019 1924   Sepsis Labs: @LABRCNTIP (procalcitonin:4,lacticidven:4)  ) Recent Results (from the past 240 hour(s))  SARS CORONAVIRUS 2 (TAT 6-24 HRS) Nasopharyngeal Nasopharyngeal Swab     Status: None   Collection Time: 10/11/19  8:04 PM   Specimen: Nasopharyngeal Swab  Result Value Ref Range Status   SARS Coronavirus 2 NEGATIVE NEGATIVE Final    Comment: (NOTE) SARS-CoV-2 target nucleic acids are NOT DETECTED. The SARS-CoV-2 RNA is generally detectable in upper and lower respiratory specimens during the acute phase of infection. Negative results do not preclude SARS-CoV-2 infection, do not rule  out co-infections with other pathogens, and should not be used as the sole basis for treatment or other patient management decisions. Negative results must be combined with clinical observations, patient history, and epidemiological information. The expected result is Negative. Fact Sheet for Patients: SugarRoll.be Fact Sheet for Healthcare Providers: https://www.woods-mathews.com/ This test is not yet approved or cleared by the [place] FDA and  has been authorized for detection and/or diagnosis of SARS-CoV-2 by FDA under an Emergency Use Authorization (EUA). This EUA will remain  in effect (meaning this test can be used) for the duration of the COVID-19 declaration under Section 56 4(b)(1) of the Act, 21 U.S.C. section 360bbb-3(b)(1), unless the authorization is terminated or revoked sooner. Performed at [facility], [place] [address], [place], [place] [address]       Studies: CT Abdomen Pelvis W Contrast  Result Date: 10/11/2019 CLINICAL DATA:  Abdominal distension. Elevated lipase. Referred by primary care physician for possible pancreatitis. Abdominal pain and nausea. EXAM: CT ABDOMEN AND PELVIS WITH CONTRAST TECHNIQUE: Multidetector CT imaging of the abdomen and pelvis was performed using the standard protocol following bolus administration of intravenous contrast. CONTRAST:  142mL OMNIPAQUE IOHEXOL 300 MG/ML  SOLN COMPARISON:  CT 09/10/2019 FINDINGS: Lower chest: Minor hypoventilatory changes and left lower lobe scarring. No pleural effusion or acute airspace disease. Hepatobiliary: No focal liver abnormality is seen. No gallstones, gallbladder wall thickening, or biliary dilatation. Pancreas: Mild peripancreatic fat stranding is most prominent about the head and uncinate process, to a lesser extent pancreatic body. Cystic lesion within the pancreatic head measures 1.8 x 1.7 x 1.8 cm, previously 2.2 x 1.9 x 2.0 cm. No new  peripancreatic fluid collection. Mild prominence of the pack small pancreatic duct at 4 mm. No pancreatic calcifications. Spleen: Normal in size without focal abnormality. The splenic vein is patent. Adrenals/Urinary Tract: Left adrenal thickening without dominant nodule. Right adrenal gland is normal. 6 mm stone in the right proximal ureter without associated hydronephrosis, stone unchanged in position from prior exam. No perinephric edema. There is symmetric excretion on delayed phase imaging. Small low-density lesions within both kidneys are too small to characterize but likely cysts. Urinary bladder is partially distended. No bladder stone or wall thickening. Mild pelvic floor descent with small cystocele. Stomach/Bowel: Wall thickening of the pre pyloric stomach and proximal duodenum, may be in part reactive. Stomach is distended. Small bowel is otherwise decompressed. No obstruction. Normal appendix. Mild colonic diverticulosis without diverticulitis. Small volume of colonic stool. Vascular/Lymphatic: Infrarenal aortic aneurysm at 3.1 cm with mural thrombus and ulcerated plaque. No periaortic stranding. Moderate aortic atherosclerosis. The portal and splenic veins are patent. Mesenteric vessels are grossly patent. Dilatation of the ovarian veins with contrast refluxing,  left greater than right. Left ovarian vein measures 7 mm. Small peripancreatic nodes, largest measuring 10 mm, likely reactive. Reproductive: Prominent periuterine and adnexal vascularity. Uterus otherwise quiescent. No suspicious adnexal mass. Other: Mild peripancreatic edema tracks in the central mesentery. No significant ascites. No free air. Musculoskeletal: There are no acute or suspicious osseous abnormalities. Mild degenerative change in the spine. IMPRESSION: 1. Acute pancreatitis. Cystic lesion in the pancreatic head measures 1.8 x 1.7 x 1.8 cm, previously 2.2 x 1.9 x 2.0 cm on CT 1 month ago, favoring pseudocyst with slight  improvement. 2. Wall thickening of the pre pyloric stomach and proximal duodenum, may be in part reactive. 3. Infrarenal aortic aneurysm measuring 3.1 cm. Mural thrombus with ulcerated plaque, unchanged from recent prior exam. 4. Dilatation of the ovarian veins with contrast refluxing, left greater than right. This can be seen with pelvic congestion syndrome in the appropriate clinical setting. 5. Right proximal ureteral calculus, unchanged in position from prior exam. This is likely nonobstructing, as there is no hydronephrosis in symmetric excretion on delayed phase imaging. 6. Colonic diverticulosis without diverticulitis. Aortic Atherosclerosis (ICD10-I70.0). Aortic aneurysm NOS (ICD10-I71.9). Electronically Signed   By: Keith Rake M.D.   On: 10/11/2019 20:37    Scheduled Meds: . pantoprazole (PROTONIX) IV  40 mg Intravenous Q24H  . [START ON 10/13/2019] predniSONE  20 mg Oral Q breakfast  . rivaroxaban  20 mg Oral q1800  . triamcinolone cream  1 application Topical BID    Continuous Infusions: . sodium chloride 125 mL/hr at 10/12/19 0100     LOS: 1 day     Kayleen Memos, MD [facility] Pager [phone]  If 7PM-7AM, please contact night-coverage [email or web address] Password [facility] 10/12/2019, 11:25 AM

## 2019-10-12 NOTE — Progress Notes (Signed)
Updated the patient's spouse Herbie Baltimore via phone.  All questions answered.

## 2019-10-13 LAB — CBC WITH DIFFERENTIAL/PLATELET
Abs Immature Granulocytes: 0.02 10*3/uL (ref 0.00–0.07)
Basophils Absolute: 0 10*3/uL (ref 0.0–0.1)
Basophils Relative: 0 %
Eosinophils Absolute: 0 10*3/uL (ref 0.0–0.5)
Eosinophils Relative: 0 %
HCT: 41 % (ref 36.0–46.0)
Hemoglobin: 12.8 g/dL (ref 12.0–15.0)
Immature Granulocytes: 0 %
Lymphocytes Relative: 13 %
Lymphs Abs: 0.9 10*3/uL (ref 0.7–4.0)
MCH: 31.9 pg (ref 26.0–34.0)
MCHC: 31.2 g/dL (ref 30.0–36.0)
MCV: 102.2 fL — ABNORMAL HIGH (ref 80.0–100.0)
Monocytes Absolute: 0.3 10*3/uL (ref 0.1–1.0)
Monocytes Relative: 4 %
Neutro Abs: 5.6 10*3/uL (ref 1.7–7.7)
Neutrophils Relative %: 83 %
Platelets: 307 10*3/uL (ref 150–400)
RBC: 4.01 MIL/uL (ref 3.87–5.11)
RDW: 14 % (ref 11.5–15.5)
WBC: 6.8 10*3/uL (ref 4.0–10.5)
nRBC: 0 % (ref 0.0–0.2)

## 2019-10-13 LAB — COMPREHENSIVE METABOLIC PANEL
ALT: 11 U/L (ref 0–44)
AST: 14 U/L — ABNORMAL LOW (ref 15–41)
Albumin: 2.8 g/dL — ABNORMAL LOW (ref 3.5–5.0)
Alkaline Phosphatase: 45 U/L (ref 38–126)
Anion gap: 9 (ref 5–15)
BUN: 18 mg/dL (ref 6–20)
CO2: 19 mmol/L — ABNORMAL LOW (ref 22–32)
Calcium: 8.2 mg/dL — ABNORMAL LOW (ref 8.9–10.3)
Chloride: 108 mmol/L (ref 98–111)
Creatinine, Ser: 0.5 mg/dL (ref 0.44–1.00)
GFR calc Af Amer: >60 mL/min (ref >60)
GFR calc non Af Amer: >60 mL/min (ref >60)
Glucose, Bld: 83 mg/dL (ref 70–99)
Potassium: 4.1 mmol/L (ref 3.5–5.1)
Sodium: 136 mmol/L (ref 135–145)
Total Bilirubin: 0.4 mg/dL (ref 0.3–1.2)
Total Protein: 5.7 g/dL — ABNORMAL LOW (ref 6.5–8.1)

## 2019-10-13 LAB — LIPASE, BLOOD: Lipase: 120 U/L — ABNORMAL HIGH (ref 11–51)

## 2019-10-13 LAB — IGG 4: IgG, Subclass 4: 19 mg/dL (ref 2–96)

## 2019-10-13 MED ORDER — PANTOPRAZOLE SODIUM 40 MG PO TBEC
40.0000 mg | DELAYED_RELEASE_TABLET | Freq: Every day | ORAL | Status: DC
  Administered 2019-10-13 – 2019-10-14 (×2): 40 mg via ORAL
  Filled 2019-10-13 (×2): qty 1

## 2019-10-13 MED ORDER — ZOLPIDEM TARTRATE 5 MG PO TABS: 5.0000 mg | ORAL_TABLET | Freq: Every evening | ORAL | Status: DC | PRN

## 2019-10-13 NOTE — Progress Notes (Signed)
PROGRESS NOTE  GRASIELA HUSSONG [id number] DOB: 1961/06/14 DOA: 10/11/2019 PCP: [name], DO  HPI/Recap of past 24 hours: [name] is a 59 y.o. female with medical history significant for coronary artery disease, history of CVA with right-sided motor deficits, paroxysmal atrial fibrillation on Xarelto, and recent admission for acute pancreatitis, now returning to the ED for evaluation of severe recurrent abdominal pain with marked elevation in lipase on outpatient blood work.  Patient was discharged from the hospital just under a month ago after management of acute idiopathic pancreatitis.  She does not drink alcohol, triglycerides were normal, she had normal transaminases and bilirubin, and a 2.2 cm cystic lesion within the pancreatic head was noted on CT at that time.  She reports that symptoms were completely resolved when she first returned home from the hospital, but she began to develop recurrent upper abdominal pain within a few days of the discharge.  Since the recent discharge, she has also had some gross hematuria and completed 5 days of Bactrim.  She was recently suspected of having autoimmune urticaria, has a prescription for prednisone, but has not taken any.  IgG4 level is pending on outpatient labs.  ED Course: Upon arrival to the ED, patient is found to be afebrile, saturating well on room air, slightly tachycardic, and with stable blood pressure.  Chemistry panel was notable for lipase of 2834 and amylase 677.  CBC with hemoglobin 15.4 and platelets 430,000.  CT the abdomen and pelvis demonstrates slight improvement in likely pancreatic pseudocyst.  No acute hepatobiliary findings on CT.  Patient was treated with IV fluids, morphine, and Zofran in the emergency department, COVID-19 screening test is in process, and hospitalist consulted for admission.  10/12/19: Seen and examined.  Reports abdominal pain 7 out of 10 improved with pain medication.  Also reports  indigestion and itchy rash underneath her breasts.  Was prescribed prednisone but never started.  Will resume.  10/13/19: Seen and examined. States her abdominal pain is improved.  Lipase level significantly decreased after starting IV steroids yesterday.  May need to rule out autoimmune pancreatitis.  Denies nausea.  We will start clear liquid diet.  She is seen at [facility] .  GI consulted due to recurrent pseudocyst pancreatitis.  Assessment/Plan: Principal Problem:   Acute recurrent pancreatitis Active Problems:   Cerebrovascular accident (CVA) due to occlusion of left middle cerebral artery (HCC)   Coronary artery disease   Chronic diastolic CHF (congestive heart failure) (HCC)   Paroxysmal atrial fibrillation ([place])  1.   Recurrent pseudocyst pancreatitis - Presented with recurrent severe upper abdominal pain with lipase 2834 and amylase 677 on outpatient labs, CT in ED with slight improvement in pancreatic pseudocyst from last month  No gallstone, no biliary dilatation, no use of alcohol or medications that may cause pancreatitis.  Triglycerides within normal range. Significant drop in lipase level this morning 120 from 2834 (1/15) Received IV solumedrol on 1/16 for urticaria.  Rule out autoimmune pancreatitis? Symptomatology improving, started clear liquid diet. GI [place] consulted and will see in consultation.  Appreciate assistance  2. Paroxysmal atrial fibrillation  - CHADS-VASc 5 (gender, CVA x2, CAD, CHF)  In sinus rhythm - Continue Xarelto    3. History of CVA  - Chronic right-sided motor deficits present  - Continue Xarelto  for secondary CVA prevention Start statin due to elevated LDL >110  4. Chronic diastolic CHF  - Appears compensated  - EF 50-55% in November 2020  -  Hold diuretic while hydrating, follow daily weights    GERD Continue Protonix, switch to oral 40 mg daily.    Infrarenal aortic aneurysm measuring 3.1 cm with mural thrombus and ulcerated  plaque Continue with Xarelto Last LDL elevated 158 will start statin   Hyperlipidemia Management as stated above  Resolving urticaria Home medications on home prednisone 20 mg daily with taper Continue for now IV Solu-Medrol 40 mg daily will switch to oral in the next 24 to 48 hours.   Continue home kenalog cream   DVT prophylaxis: Xarelto  Code Status: Full  Family Communication:  Updated her husband on 10/12/2019. Consults called:  GI on 10/13/2019.  Disposition: We will plan to DC once GI signs off.  Objective: Vitals:   10/12/19 0508 10/12/19 1434 10/12/19 2158 10/13/19 0518  BP: 113/68 102/71 100/65 108/66  Pulse: 93 96 99 87  Resp: 17 17 18 16   Temp: 98 F (36.7 C) 97.9 F (36.6 C) (!) 97.5 F (36.4 C) (!) 97.5 F (36.4 C)  TempSrc: Oral Oral Oral Oral  SpO2: 91% 92% 90% 96%  Weight:      Height:        Intake/Output Summary (Last 24 hours) at 10/13/2019 [id number] Last data filed at 10/13/2019 0100 Gross per 24 hour  Intake 256.25 ml  Output 400 ml  Net -143.75 ml   Filed Weights   10/11/19 2210  Weight: 54.2 kg    Exam:  . General: 59 y.o. year-old female well-developed well-nourished in no acute distress.  Alert and oriented x3.   . Cardiovascular: Regular rate and rhythm no rubs or gallops.   [name] Respiratory: Clear to auscultation with no wheezes or rales.  Good inspiratory effort. . Abdomen: Soft mildly tender.  Bowel sounds present. . Musculoskeletal: No lower extremity edema bilaterally.[name] Psychiatry: Mood is appropriate for condition and setting.  Data Reviewed: CBC: Recent Labs  Lab 10/11/19 1231 10/12/19 0612 10/13/19 0532  WBC 10.1 9.3 6.8  NEUTROABS 6.2  --  5.6  HGB 15.4* 13.5 12.8  HCT 46.2* 41.8 41.0  MCV 96.0 99.5 102.2*  PLT 430.0* 324 [id number]   Basic Metabolic Panel: Recent Labs  Lab 10/11/19 1231 10/12/19 0612 10/13/19 0532  NA 137 136 136  K 4.4 3.8 4.1  CL 100 105 108  CO2 28 24 19*  GLUCOSE 79 96 83  BUN 14 15 18     CREATININE 0.77 0.55 0.50  CALCIUM 10.5 8.7* 8.2*   GFR: Estimated Creatinine Clearance: 57.1 mL/min (by C-G formula based on SCr of 0.5 mg/dL). Liver Function Tests: Recent Labs  Lab 10/11/19 1231 10/12/19 0612 10/13/19 0532  AST 16 14* 14*  ALT 10 10 11   ALKPHOS 73 57 45  BILITOT 0.5 0.4 0.4  PROT 7.7 6.4* 5.7*  ALBUMIN 4.3 3.3* 2.8*   Recent Labs  Lab 10/11/19 1231 10/13/19 0532  LIPASE 2,834.0* 120*  AMYLASE 677*  --    No results for input(s): AMMONIA in the last 168 hours. Coagulation Profile: No results for input(s): INR, PROTIME in the last 168 hours. Cardiac Enzymes: No results for input(s): CKTOTAL, CKMB, CKMBINDEX, TROPONINI in the last 168 hours. BNP (last 3 results) No results for input(s): PROBNP in the last 8760 hours. HbA1C: No results for input(s): HGBA1C in the last 72 hours. CBG: No results for input(s): GLUCAP in the last 168 hours. Lipid Profile: No results for input(s): CHOL, HDL, LDLCALC, TRIG, CHOLHDL, LDLDIRECT in the last 72 hours. Thyroid Function Tests: No results  for input(s): TSH, T4TOTAL, FREET4, T3FREE, THYROIDAB in the last 72 hours. Anemia Panel: No results for input(s): VITAMINB12, FOLATE, FERRITIN, TIBC, IRON, RETICCTPCT in the last 72 hours. Urine analysis:    Component Value Date/Time   COLORURINE YELLOW 10/11/2019 1924   APPEARANCEUR CLEAR 10/11/2019 1924   LABSPEC >1.046 (H) 10/11/2019 1924   PHURINE 6.0 10/11/2019 1924   GLUCOSEU NEGATIVE 10/11/2019 [place] NEGATIVE 10/11/2019 1924   BILIRUBINUR NEGATIVE 10/11/2019 1924   BILIRUBINUR Negative 09/18/2019 1137   KETONESUR 20 (A) 10/11/2019 [place] NEGATIVE 10/11/2019 1924   UROBILINOGEN negative (A) 09/18/2019 1137   NITRITE NEGATIVE 10/11/2019 1924   LEUKOCYTESUR NEGATIVE 10/11/2019 1924   Sepsis Labs: @LABRCNTIP (procalcitonin:4,lacticidven:4)  ) Recent Results (from the past 240 hour(s))  SARS CORONAVIRUS 2 (TAT 6-24 HRS) Nasopharyngeal  Nasopharyngeal Swab     Status: None   Collection Time: 10/11/19  8:04 PM   Specimen: Nasopharyngeal Swab  Result Value Ref Range Status   SARS Coronavirus 2 NEGATIVE NEGATIVE Final    Comment: (NOTE) SARS-CoV-2 target nucleic acids are NOT DETECTED. The SARS-CoV-2 RNA is generally detectable in upper and lower respiratory specimens during the acute phase of infection. Negative results do not preclude SARS-CoV-2 infection, do not rule out co-infections with other pathogens, and should not be used as the sole basis for treatment or other patient management decisions. Negative results must be combined with clinical observations, patient history, and epidemiological information. The expected result is Negative. Fact Sheet for Patients: SugarRoll.be Fact Sheet for Healthcare Providers: https://www.woods-mathews.com/ This test is not yet approved or cleared by the [place] FDA and  has been authorized for detection and/or diagnosis of SARS-CoV-2 by FDA under an Emergency Use Authorization (EUA). This EUA will remain  in effect (meaning this test can be used) for the duration of the COVID-19 declaration under Section 56 4(b)(1) of the Act, 21 U.S.C. section 360bbb-3(b)(1), unless the authorization is terminated or revoked sooner. Performed at [facility], [place] [address], [place], [place] [address]       Studies: No results found.  Scheduled Meds: . methylPREDNISolone (SOLU-MEDROL) injection  40 mg Intravenous Daily  . pantoprazole (PROTONIX) IV  40 mg Intravenous Q24H  . rivaroxaban  20 mg Oral q1800  . rosuvastatin  20 mg Oral q1800  . triamcinolone cream  1 application Topical BID    Continuous Infusions: . sodium chloride 125 mL/hr at 10/13/19 0100     LOS: 2 days     Kayleen Memos, MD [facility] Pager [phone]  If 7PM-7AM, please contact night-coverage [email or web address] Password [facility] 10/13/2019, 9:52 AM

## 2019-10-13 NOTE — Progress Notes (Signed)
Updated the patient's spouse Herbie Baltimore via phone.  All questions answered.

## 2019-10-14 DIAGNOSIS — K859 Acute pancreatitis without necrosis or infection, unspecified: Secondary | ICD-10-CM

## 2019-10-14 LAB — CBC WITH DIFFERENTIAL/PLATELET
Abs Immature Granulocytes: 0.05 10*3/uL (ref 0.00–0.07)
Basophils Absolute: 0 10*3/uL (ref 0.0–0.1)
Basophils Relative: 0 %
Eosinophils Absolute: 0 10*3/uL (ref 0.0–0.5)
Eosinophils Relative: 0 %
HCT: 37.4 % (ref 36.0–46.0)
Hemoglobin: 12.3 g/dL (ref 12.0–15.0)
Immature Granulocytes: 1 %
Lymphocytes Relative: 15 %
Lymphs Abs: 1.6 10*3/uL (ref 0.7–4.0)
MCH: 32.3 pg (ref 26.0–34.0)
MCHC: 32.9 g/dL (ref 30.0–36.0)
MCV: 98.2 fL (ref 80.0–100.0)
Monocytes Absolute: 1 10*3/uL (ref 0.1–1.0)
Monocytes Relative: 10 %
Neutro Abs: 7.8 10*3/uL — ABNORMAL HIGH (ref 1.7–7.7)
Neutrophils Relative %: 74 %
Platelets: 376 10*3/uL (ref 150–400)
RBC: 3.81 MIL/uL — ABNORMAL LOW (ref 3.87–5.11)
RDW: 13.9 % (ref 11.5–15.5)
WBC: 10.5 10*3/uL (ref 4.0–10.5)
nRBC: 0 % (ref 0.0–0.2)

## 2019-10-14 LAB — COMPREHENSIVE METABOLIC PANEL
ALT: 12 U/L (ref 0–44)
AST: 14 U/L — ABNORMAL LOW (ref 15–41)
Albumin: 3.3 g/dL — ABNORMAL LOW (ref 3.5–5.0)
Alkaline Phosphatase: 47 U/L (ref 38–126)
Anion gap: 8 (ref 5–15)
BUN: 9 mg/dL (ref 6–20)
CO2: 23 mmol/L (ref 22–32)
Calcium: 8.7 mg/dL — ABNORMAL LOW (ref 8.9–10.3)
Chloride: 108 mmol/L (ref 98–111)
Creatinine, Ser: 0.48 mg/dL (ref 0.44–1.00)
GFR calc Af Amer: >60 mL/min (ref >60)
GFR calc non Af Amer: >60 mL/min (ref >60)
Glucose, Bld: 92 mg/dL (ref 70–99)
Potassium: 3.7 mmol/L (ref 3.5–5.1)
Sodium: 139 mmol/L (ref 135–145)
Total Bilirubin: 0.7 mg/dL (ref 0.3–1.2)
Total Protein: 6.1 g/dL — ABNORMAL LOW (ref 6.5–8.1)

## 2019-10-14 LAB — LIPASE, BLOOD: Lipase: 116 U/L — ABNORMAL HIGH (ref 11–51)

## 2019-10-14 MED ORDER — CARVEDILOL 3.125 MG PO TABS
3.1250 mg | ORAL_TABLET | Freq: Two times a day (BID) | ORAL | Status: DC
  Administered 2019-10-14: 17:00:00 3.125 mg via ORAL
  Filled 2019-10-14: qty 1

## 2019-10-14 MED ORDER — ROSUVASTATIN CALCIUM 20 MG PO TABS: 20.0000 mg | ORAL_TABLET | Freq: Every day | ORAL | 0 refills | Status: DC

## 2019-10-14 MED ORDER — PREDNISONE 20 MG PO TABS: 20.0000 mg | ORAL_TABLET | Freq: Every day | ORAL | Status: DC

## 2019-10-14 NOTE — Consult Note (Addendum)
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 59 yo female with acute / subacute pancreatitis of unclear etiology. Got better after hospital discharge mid December but with recurrent symptoms over last several days. Etiology of pancreatitis remains elusive. There was question of autoimmune etiology ( interestingly her pain did get better with steroids given for autoimmune urticaria) however she has a normal IgG Subclass 4 level. Lasix is the only medication she takes which has been associated with pancreatitis in some people. -No further pain or significant nausea. Hydration status, renal function looks good. Lipase down to 16. She is tolerating clears, to try soft diet for lunch. Wants to go home and continue workup as outpatient. Let's see how she does with diet.    2. PAF, on Xarelto  3. Chronic diastolic CHF, appears compensated. Diuretics on hold while hydrating.   Discussed w/ [name]. Patient feels well. IgG4 NL  Can go home and see Dr. Bryan Lemma as outpatient Hold furosemide for now  Kaitlyn Mayer, MD, [facility] 10/14/2019 2:37 PM   HPI:    Chief Complaint: pancreatitis  [name] is a 59 y.o. female who we first met in the hospital mid December when consulted for evaluation and management of acute pancreatitis. It was her first episode of pancreatitis and of undetermined etiology with no cholelithiasis, normal TG level, no Etoh, no medication changes. . CT scan confirmed acute pancreatitis along with a 2.3 cm cystic lesion in head of pancreas without PD dilation. Gallbladder and CBD were normal  appearing. She improved and was discharged with plans for repeat CT scan in 6-8 weeks for reassessment of pancreatic cyst, +/- EUS depending on CT scan results.   Macil was seen in office for hospital follow up 10/11/19 and gave a one week history of recurrent upper abdominal  pain, distention and reduced appetite. Unrelated she also complained of hematuria and had been treated with Bactrim for cystitis since hospital discharge. Labs drawn, suggested recurrent pancreatitis and with that and progressive symptoms she was sent to ED. In ED her lipase was 2800, she was stable other than mild tachycardia. Hct elevated at 46., WBC normal. Renal function normal. CT scan w/ contrast showed acute pancreatitis, slight improvement in size of pseudocyst, wall thickening of pre-pyloric stomach and proximal duodenum ( possibly reactive).  Doshie feels better. No abdominal in a couple of days. Only mild intermittent nausea. Asks about going home today.   Past Medical History:  Diagnosis Date   Angio-edema    Arthritis    CAD (coronary artery disease)    a. s/p NSTEMI in 10/2017 with cath showing LM disease --> s/p CABG on 11/23/2017 with LIMA-LAD and SVG-OM.    Hay fever    Ischemic cardiomyopathy    a. EF 35-40% by echo in 10/2017   NSTEMI (non-ST elevated myocardial infarction) ([place])    PAF (paroxysmal atrial fibrillation) ([place])    Diagnosed by ILR   Pneumonia 11/16/2017   Stroke ([place])  08/23/2017   Left MCA infarct status post TPA and thrombectomy   Urticaria     Past Surgical History:  Procedure Laterality Date   CORONARY ARTERY BYPASS GRAFT N/A 11/23/2017   Procedure: CORONARY ARTERY BYPASS GRAFTING (CABG) x 2, ON PUMP, USING LEFT INTERNAL MAMMARY ARTERY AND RIGHT GREATER SAPHENOUS VEIN HARVESTED ENDOSCOPICALLY;  Surgeon: Gaye Pollack, MD;  Location: [facility];  Service: Open Heart Surgery;  Laterality: N/A;   CRYOABLATION     cervical   IR ANGIO INTRA EXTRACRAN SEL COM CAROTID  INNOMINATE UNI R MOD SED  08/21/2017   IR ANGIO VERTEBRAL SEL SUBCLAVIAN INNOMINATE UNI R MOD SED  08/21/2017   IR PERCUTANEOUS ART THROMBECTOMY/INFUSION INTRACRANIAL INC DIAG ANGIO  08/21/2017   IR RADIOLOGIST EVAL & MGMT  10/30/2017   LOOP RECORDER INSERTION N/A 08/28/2017   Procedure: LOOP RECORDER INSERTION;  Surgeon: Constance Haw, MD;  Location: [facility];  Service: Cardiovascular;  Laterality: N/A;   RADIOLOGY WITH ANESTHESIA N/A 08/21/2017   Procedure: RADIOLOGY WITH ANESTHESIA;  Surgeon: Luanne Bras, MD;  Location: [facility];  Service: Radiology;  Laterality: N/A;   RIGHT/LEFT HEART CATH AND CORONARY ANGIOGRAPHY N/A 11/20/2017   Procedure: RIGHT/LEFT HEART CATH AND CORONARY ANGIOGRAPHY;  Surgeon: Jettie Booze, MD;  Location: [place] [facility];  Service: Cardiovascular;  Laterality: N/A;   TEE WITHOUT CARDIOVERSION N/A 08/28/2017   Procedure: TRANSESOPHAGEAL ECHOCARDIOGRAM (TEE);  Surgeon: Fay Records, MD;  Location: [place];  Service: Cardiovascular;  Laterality: N/A;   TEE WITHOUT CARDIOVERSION N/A 11/23/2017   Procedure: TRANSESOPHAGEAL ECHOCARDIOGRAM (TEE);  Surgeon: Gaye Pollack, MD;  Location: [place] [facility];  Service: Open Heart Surgery;  Laterality: N/A;   TUBAL LIGATION      Prior to Admission medications   Medication Sig Start Date End Date Taking? Authorizing Provider  acetaminophen (TYLENOL) 325 MG tablet Take 2 tablets (650 mg total) by mouth every 6 (six) hours as needed for mild pain or moderate pain (or Fever >/= 101). 09/13/19  Yes Rai, Ripudeep K, MD  azelastine (ASTELIN) 0.1 % nasal spray Place 1-2 sprays into both nostrils 2 (two) times daily as needed for rhinitis. Use in each nostril as directed 09/17/19  Yes Bobbitt, Sedalia Muta, MD  baclofen (LIORESAL) 10 MG tablet TAKE 1 TABLET BY MOUTH THREE TIMES DAILY Patient taking differently: Take 10 mg by mouth 2 (two) times daily.  09/25/19  Yes Jamse Arn, MD  calcium carbonate  (TUMS - DOSED IN MG ELEMENTAL CALCIUM) 500 MG chewable tablet Chew 1 tablet by mouth 2 (two) times daily.    Yes [provider]  carvedilol (COREG) 3.125 MG tablet Take 1 tablet by mouth twice daily 09/02/19  Yes Satira Sark, MD  cholecalciferol (VITAMIN D) 1000 units tablet Take 1,000 Units by mouth daily.   Yes [provider]  famotidine (PEPCID) 20 MG tablet Take 20 mg by mouth daily.    Yes [provider]  ferrous sulfate 325 (65 FE) MG tablet Take 325 mg by mouth 2 (two) times a week. Tuesday and Thursday.   Yes [provider]  furosemide (LASIX) 20 MG tablet Take 0.5 tablets (10 mg total) by mouth daily. 10/11/19  Yes [name], [name], DO  hydrOXYzine (ATARAX/VISTARIL) 10 MG tablet Take 1 tablet (10 mg total) by mouth at bedtime. Patient taking differently: Take 10 mg by mouth at bedtime as needed for anxiety.  07/01/19  Yes [name], [name], DO  levocetirizine (XYZAL) 5  MG tablet Take 1 tablet (5 mg total) by mouth every evening. 09/17/19  Yes Bobbitt, Sedalia Muta, MD  ondansetron (ZOFRAN ODT) 4 MG disintegrating tablet Take 1 tablet (4 mg total) by mouth every 8 (eight) hours as needed for nausea or vomiting. 09/13/19  Yes Rai, Ripudeep K, MD  Potassium Chloride ER 20 MEQ TBCR Take 1 tablet by mouth daily. 09/14/19  Yes Rai, Vernelle Emerald, MD  rivaroxaban (XARELTO) 20 MG TABS tablet Take 1 tablet daily with evening meal 09/25/19  Yes Satira Sark, MD  sacubitril-valsartan (ENTRESTO) 24-26 MG Take 1 tablet by mouth 2 (two) times daily. 09/15/19  Yes Rai, Ripudeep K, MD  traMADol (ULTRAM) 50 MG tablet Take 50 mg by mouth every 6 (six) hours as needed for moderate pain.   Yes [provider]  triamcinolone cream (KENALOG) 0.1 % Apply 1 application topically 2 (two) times daily. Patient taking differently: Apply 1 application topically 2 (two) times daily as needed (rash).  04/18/18  Yes [name], [name], DO  vitamin B-12 (CYANOCOBALAMIN) 100  MCG tablet Take 100 mcg by mouth daily.   Yes [provider]  Azelastine-Fluticasone 137-50 MCG/ACT SUSP Place 1 spray into both nostrils 2 (two) times daily as needed. Patient not taking: Reported on 10/11/2019 07/09/19   Bobbitt, Sedalia Muta, MD  loratadine (CLARITIN) 10 MG tablet Take 1 tablet (10 mg total) by mouth daily. Patient not taking: Reported on 10/11/2019 06/25/19   Carollee Herter, Alferd Apa, DO  predniSONE (DELTASONE) 20 MG tablet 60 mg x3d, 40 mg x3d, 20 mg x2d, 10 mg x2d Patient not taking: Reported on 10/11/2019 09/16/19   Howard Pouch A, DO    Current Facility-Administered Medications  Medication Dose Route Frequency Provider Last Rate Last Admin   acetaminophen (TYLENOL) tablet 650 mg  650 mg Oral Q6H PRN Opyd, Ilene Qua, MD       Or   acetaminophen (TYLENOL) suppository 650 mg  650 mg Rectal Q6H PRN Opyd, Ilene Qua, MD       fentaNYL (SUBLIMAZE) injection 25-50 mcg  25-50 mcg Intravenous Q2H PRN Opyd, Ilene Qua, MD   50 mcg at 10/12/19 1412   methylPREDNISolone sodium succinate (SOLU-MEDROL) 40 mg/mL injection 40 mg  40 mg Intravenous Daily Irene Pap N, DO   40 mg at 10/13/19 1051   ondansetron (ZOFRAN) injection 4 mg  4 mg Intravenous Q6H PRN Opyd, Ilene Qua, MD       pantoprazole (PROTONIX) EC tablet 40 mg  40 mg Oral Daily Hall, Carole N, DO   40 mg at 10/13/19 1051   promethazine (PHENERGAN) tablet 12.5 mg  12.5 mg Oral Q6H PRN Opyd, Ilene Qua, MD   12.5 mg at 10/14/19 0745   rivaroxaban (XARELTO) tablet 20 mg  20 mg Oral q1800 Opyd, Ilene Qua, MD   20 mg at 10/13/19 1734   rosuvastatin (CRESTOR) tablet 20 mg  20 mg Oral q1800 Irene Pap N, DO   20 mg at 10/13/19 1734   triamcinolone cream (KENALOG) 0.1 % 1 application  1 application Topical BID Irene Pap N, DO   1 application at 34/91/79 1005   zolpidem (AMBIEN) tablet 5 mg  5 mg Oral QHS PRN Vertis Kelch, NP        Allergies as of 10/11/2019 - Review Complete 10/11/2019  Allergen  Reaction Noted   Lopressor [metoprolol tartrate] Swelling 09/05/2017   Allegra allergy [fexofenadine hcl] Hives 07/16/2019   Dilaudid [hydromorphone]  09/24/2019   Nsaids  10/24/2018   Gabapentin (once-daily) Rash 07/01/2019   Lipitor [atorvastatin] Diarrhea 10/13/2017    Family History  Problem Relation Age of Onset   Heart attack Father    Hypertension Mother    Hyperlipidemia Mother    Diabetes type II Mother    Urticaria Mother    Hypertension Brother    Breast cancer Cousin 55   Immunodeficiency Neg Hx    Eczema Neg Hx    Atopy Neg Hx    Asthma Neg Hx    Angioedema Neg Hx    Allergic rhinitis Neg Hx    Colon cancer Neg Hx    Esophageal cancer Neg Hx     Social History   Socioeconomic History   Marital status: Married    Spouse name: Not on file   Number of children: 2   Years of education: Not on file   Highest education level: Not on file  Occupational History   Not on file  Tobacco Use   Smoking status: Former Smoker    Packs/day: 0.10    Years: 37.00    Pack years: 3.70    Types: Cigarettes    Quit date: 07/17/2018    Years since quitting: 1.2   Smokeless tobacco: Never Used  Substance and Sexual Activity   Alcohol use: No   Drug use: No   Sexual activity: Yes    Partners: Male  Other Topics Concern   Not on file  Social History Narrative   Married. 2 children.    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    Walks with cane or wheelchair.    Former smoker.    Drinks caffeine.    Smoke alarm in the home.   Firearms in the home.    Wears her seatbelt.    Feels safe In her relationships.    Social Determinants of Health   Financial Resource Strain:    Difficulty of Paying Living Expenses: Not on file  Food Insecurity:    Worried About Charity fundraiser in the Last Year: Not on file   [employer] of Food in the Last Year: Not on file  Transportation Needs:    Lack of Transportation (Medical): Not on file   Lack of  Transportation (Non-Medical): Not on file  Physical Activity:    Days of Exercise per Week: Not on file   Minutes of Exercise per Session: Not on file  Stress:    Feeling of Stress : Not on file  Social Connections:    Frequency of Communication with Friends and Family: Not on file   Frequency of Social Gatherings with Friends and Family: Not on file   Attends Religious Services: Not on file   Active Member of Clubs or Organizations: Not on file   Attends Archivist Meetings: Not on file   Marital Status: Not on file  Intimate Partner Violence:    Fear of Current or Ex-Partner: Not on file   Emotionally Abused: Not on file   Physically Abused: Not on file   Sexually Abused: Not on file    Review of Systems: All systems reviewed and negative except where noted in HPI.  Physical Exam: Vital signs in last 24 hours: Temp:  [97.8 F (36.6 C)-98 F (36.7 C)] 98 F (36.7 C) (01/18 0610) Pulse Rate:  [78-100] 78 (01/18 0610) Resp:  [15-18] 17 (01/18 0610) BP: (99-134)/(60-79) 99/79 (01/18 0610) SpO2:  [94 %-96 %] 96 % (01/18 0610) Weight:  [57.5 kg] 57.5 kg (01/18  0610) Last BM Date: 10/13/19 General:   Alert, well-developed,  female in NAD Psych:  Pleasant, cooperative. Normal mood and affect. Eyes:  Pupils equal, sclera clear, no icterus.   Conjunctiva pink. Ears:  Normal auditory acuity. Nose:  No deformity, discharge,  or lesions. Neck:  Supple; no masses Lungs:  Clear throughout to auscultation.   No wheezes, crackles, or rhonchi.  Heart:  Regular rate and rhythm; no murmurs, no lower extremity edema Abdomen:  Soft, non-distended, nontender, BS active, no palp mass   Rectal:  Deferred  Msk:  Symmetrical without gross deformities. . Neurologic:  Alert and  oriented x4;  grossly normal neurologically. Skin:  Intact without significant lesions or rashes.   Intake/Output from previous day: 01/17 0701 - 01/18 0700 In: 1420 [P.O.:677; I.V.:743] Out:  -  Intake/Output this shift: No intake/output data recorded.  Lab Results: Recent Labs    10/12/19 0612 10/13/19 0532 10/14/19 0516  WBC 9.3 6.8 10.5  HGB 13.5 12.8 12.3  HCT 41.8 41.0 37.4  PLT 324 307 376   BMET Recent Labs    10/12/19 0612 10/13/19 0532 10/14/19 0516  NA 136 136 139  K 3.8 4.1 3.7  CL 105 108 108  CO2 24 19* 23  GLUCOSE 96 83 92  BUN _0 CREATININE 0.55 0.50 0.48  CALCIUM 8.7* 8.2* 8.7*   LFT Recent Labs    10/14/19 0516  PROT 6.1*  ALBUMIN 3.3*  AST 14*  ALT 12  ALKPHOS 47  BILITOT 0.7   PT/INR No results for input(s): LABPROT, INR in the last 72 hours. Hepatitis Panel No results for input(s): HEPBSAG, HCVAB, HEPAIGM, HEPBIGM in the last 72 hours.   . CBC Latest Ref Rng & Units 10/14/2019 10/13/2019 10/12/2019  WBC 4.0 - 10.5 K/uL 10.5 6.8 9.3  Hemoglobin 12.0 - 15.0 g/dL 12.3 12.8 13.5  Hematocrit 36.0 - 46.0 % 37.4 41.0 41.8  Platelets 150 - 400 K/uL 376 307 324    . CMP Latest Ref Rng & Units 10/14/2019 10/13/2019 10/12/2019  Glucose 70 - 99 mg/dL 92 83 96  BUN 6 - 20 mg/dL _1 Creatinine 0.44 - 1.00 mg/dL 0.48 0.50 0.55  Sodium 135 - 145 mmol/L 139 136 136  Potassium 3.5 - 5.1 mmol/L 3.7 4.1 3.8  Chloride 98 - 111 mmol/L 108 108 105  CO2 22 - 32 mmol/L 23 19(L) 24  Calcium 8.9 - 10.3 mg/dL 8.7(L) 8.2(L) 8.7(L)  Total Protein 6.5 - 8.1 g/dL 6.1(L) 5.7(L) 6.4(L)  Total Bilirubin 0.3 - 1.2 mg/dL 0.7 0.4 0.4  Alkaline Phos 38 - 126 U/L 47 45 57  AST 15 - 41 U/L 14(L) 14(L) 14(L)  ALT 0 - 44 U/L _2 Studies/Results: No results found.  Principal Problem:   Acute recurrent pancreatitis Active Problems:   Cerebrovascular accident (CVA) due to occlusion of left middle cerebral artery (HCC)   Coronary artery disease   Chronic diastolic CHF (congestive heart failure) (HCC)   Paroxysmal atrial fibrillation ([place])    Tye Savoy, NP-C @  10/14/2019, 9:22 AM

## 2019-10-14 NOTE — Discharge Summary (Addendum)
Discharge Summary  [name] [id number] DOB: 1961/04/20  PCP: Howard Pouch A, DO  Admit date: 10/11/2019 Discharge date: 10/14/2019  Time spent: 35 minutes   Recommendations for Outpatient Follow-up:  1. CLOSELY FOLLOW UP WITH YOUR CARDIOLOGIST WITHIN A WEEK DUE TO CHANGES IN YOUR CARDIAC MEDICATIONS. 2. Follow-up with your gastroenterologist; an appointment was made for you by the GI team follow up with Dr. Bryan Lemma at [place] office on 11/11/19 at 2pm.  3. Follow-up with your primary care provider 4. Take your medications as prescribed  Discharge Diagnoses:  Active Hospital Problems   Diagnosis Date Noted  . Acute recurrent pancreatitis 10/11/2019  . Paroxysmal atrial fibrillation ([facility]) 12/19/2017  . Chronic diastolic CHF (congestive heart failure) ([facility]) 12/16/2017  . Coronary artery disease 11/23/2017  . Cerebrovascular accident (CVA) due to occlusion of left middle cerebral artery [facility])     Resolved Hospital Problems  No resolved problems to display.    Discharge Condition: Stable.  Diet recommendation: Heart healthy diet.  Vitals:   10/14/19 0610 10/14/19 1346  BP: 99/79 139/65  Pulse: 78 (!) 108  Resp: 17 18  Temp: 98 F (36.7 C) 98.1 F (36.7 C)  SpO2: 96% 95%    History of present illness:  Kaitlyn G Griffinis a 59 y.o.femalewith medical history significant forcoronary artery disease, CVA with right-sided motor deficits, paroxysmal atrial fibrillation on Xarelto, and recent admission for acute pancreatitis, now returning to the ED for evaluation of severe recurrent abdominal pain with marked elevation in lipase on outpatient blood work. Patient was discharged from the hospital just under a month ago after management of acute idiopathic pancreatitis. She does not drink alcohol, triglycerides were normal, she had normal transaminases and bilirubin, and a 2.2 cm cystic lesion within the pancreatic head was noted on CT at that time. She reports that  symptoms were completely resolved when she first returned home from the hospital, but she began to develop recurrent upper abdominal pain within a few days of the discharge. Since the recent discharge, she has also had some gross hematuria and completed 5 days of Bactrim. She was recently suspected of having autoimmune urticaria, has a prescription for prednisone, but has not taken any. IgG4 level normal.  ED Course:Upon arrival to the ED, patient is found to be afebrile, saturating well on room air, slightly tachycardic, and with stable blood pressure. Chemistry panel was notable for lipase of 2834 and amylase 677. CBC with hemoglobin 15.4 and platelets 430,000. CT the abdomen and pelvis demonstrates slight improvement in likely pancreatic pseudocyst. No acute hepatobiliary findings on CT. Patient was treated with IV fluids, morphine, and Zofran in the emergency department, COVID-19 screening test negative, and hospitalist consulted for admission.  Was prescribed prednisone for autoimmune urticaria but never started.  Received IV solumedrol for 2 days. Received 1 dose of prednisone today, 20 mg once.  She is hesitant to continue prednisone outpatient.  Will defer to her provider to restart or continue.  Lipase level trending down from >2800 to 116.  Restarted a diet and tolerating soft diet without difficulty.  Seen by GI, she will need to follow up with her gastroenterologist Dr. Bryan Lemma post hospitalization.  During her hospitalization her blood pressures were soft therefore her cardiac medications were held.  With improvement of her blood pressure on 10/14/19 Coreg was restarted.  Patient advised to closely follow-up with her cardiologist within a week.  10/14/19: Seen and examined.  No new complaints.  Vital signs and labs reviewed and  are stable.  On the day of discharge, the patient was hemodynamically stable.  She will need to follow-up with her cardiologist within a week and GI  posthospitalization.  Patient understands and agrees to plan.   Hospital Course:  Principal Problem:   Acute recurrent pancreatitis Active Problems:   Cerebrovascular accident (CVA) due to occlusion of left middle cerebral artery ([facility])   Coronary artery disease   Chronic diastolic CHF (congestive heart failure) ([facility])   Paroxysmal atrial fibrillation ([facility])  Recurrent pseudocyst pancreatitis -Presented with recurrent severe upper abdominal pain with lipase 2834 and amylase 677 on outpatient labs, CT in ED with slight improvement in pancreatic pseudocyst from last month  No gallstone, no biliary dilatation, no use of alcohol. Triglycerides within normal range.  She is on lasix which could cause pancreatitis. Significant drop in lipase level this morning 116 on 10/14/19 from 120 from 2834 (1/15) Received IV solumedrol on 1/16 for urticaria.  Rule out autoimmune pancreatitis? But Ig4 is negative.  Defer to GI. Symptomatology resolved.  Tolerating soft diet. GI [place] following.  Appreciate assistance.  Paroxysmal atrial fibrillation -CHADS-VASc 5 (gender, CVA x2, CAD, CHF) In sinus rhythm -Continue Xareltofor secondary CVA prevention - Restart Coreg - Follow up with your cardiologist within a week.  Essential hypertension with soft blood pressures Soft blood pressures during most of her hospitalization BP medications were held.   With improvement of her blood pressure on 10/14/19 Coreg was restarted.   Patient advised to closely follow-up with her cardiologist within a week.  History of CVA -Chronic right-sided motor deficits present -Continue Xarelto for secondary CVA prevention Started statin due to elevated LDL >150, goal is less than 70.  Chronic diastolic CHF -Appears compensated, she is euvolemic. -EF 50-55% in November 2020 -Lasix on hold as can contribute to pancreatitis.  GERD Continue home medications  Infrarenal aortic aneurysm measuring 3.1 cm  with mural thrombus and ulcerated plaque Continue with Xarelto Last LDL elevated 158 (09/11/19) started statin 10/12/19.  Hyperlipidemia Management as stated above  Autoimmune urticaria Prescribed prednisone but was not taking.  She is hesitant to continue.  Defer to her provider to continue. Received 2 days of IV Solu-Medrol 40 mg daily  Received home kenalog cream    Code Status:Full  Consults called: GI [place].  Discharge Exam: BP 139/65 (BP Location: Left Arm)   Pulse (!) 108   Temp 98.1 F (36.7 C) (Oral)   Resp 18   Ht 5\' 1"  (1.549 m)   Wt 57.5 kg   LMP  (LMP Unknown)   SpO2 95%   BMI 23.95 kg/m  . General: 59 y.o. year-old female well developed well nourished in no acute distress.  Alert and oriented x3. . Cardiovascular: Regular rate and rhythm with no rubs or gallops.  No thyromegaly or JVD noted.   [name] Respiratory: Clear to auscultation with no wheezes or rales. Good inspiratory effort. . Abdomen: Soft nontender nondistended with normal bowel sounds x4 quadrants. . Musculoskeletal: No lower extremity edema. 2/4 pulses in all 4 extremities.  Some weakness in RUE. [name] Psychiatry: Mood is appropriate for condition and setting  Discharge Instructions You were cared for by a hospitalist during your hospital stay. If you have any questions about your discharge medications or the care you received while you were in the hospital after you are discharged, you can call the unit and asked to speak with the hospitalist on call if the hospitalist that took care of you is not available. Once you  are discharged, your primary care physician will handle any further medical issues. Please note that NO REFILLS for any discharge medications will be authorized once you are discharged, as it is imperative that you return to your primary care physician (or establish a relationship with a primary care physician if you do not have one) for your aftercare needs so that they can reassess  your need for medications and monitor your lab values.   Allergies as of 10/14/2019      Reactions   Lopressor [metoprolol Tartrate] Swelling   Angioedema   Allegra Allergy [fexofenadine Hcl] Hives   Dilaudid [hydromorphone]    Hallucinations   Nsaids    On Xarelto   Gabapentin (once-daily) Rash   Lipitor [atorvastatin] Diarrhea      Medication List    STOP taking these medications   Azelastine-Fluticasone 137-50 MCG/ACT Susp   Entresto 24-26 MG Generic drug: sacubitril-valsartan   furosemide 20 MG tablet Commonly known as: LASIX   loratadine 10 MG tablet Commonly known as: CLARITIN   Potassium Chloride ER 20 MEQ Tbcr   predniSONE 20 MG tablet Commonly known as: DELTASONE     TAKE these medications   acetaminophen 325 MG tablet Commonly known as: TYLENOL Take 2 tablets (650 mg total) by mouth every 6 (six) hours as needed for mild pain or moderate pain (or Fever >/= 101).   azelastine 0.1 % nasal spray Commonly known as: ASTELIN Place 1-2 sprays into both nostrils 2 (two) times daily as needed for rhinitis. Use in each nostril as directed   baclofen 10 MG tablet Commonly known as: LIORESAL TAKE 1 TABLET BY MOUTH THREE TIMES DAILY What changed: when to take this   calcium carbonate 500 MG chewable tablet Commonly known as: TUMS - dosed in mg elemental calcium Chew 1 tablet by mouth 2 (two) times daily.   carvedilol 3.125 MG tablet Commonly known as: COREG Take 1 tablet by mouth twice daily   cholecalciferol 1000 units tablet Commonly known as: VITAMIN D Take 1,000 Units by mouth daily.   famotidine 20 MG tablet Commonly known as: PEPCID Take 20 mg by mouth daily.   ferrous sulfate 325 (65 FE) MG tablet Take 325 mg by mouth 2 (two) times a week. Tuesday and Thursday.   hydrOXYzine 10 MG tablet Commonly known as: ATARAX/VISTARIL Take 1 tablet (10 mg total) by mouth at bedtime. What changed:   when to take this  reasons to take this     levocetirizine 5 MG tablet Commonly known as: XYZAL Take 1 tablet (5 mg total) by mouth every evening.   ondansetron 4 MG disintegrating tablet Commonly known as: Zofran ODT Take 1 tablet (4 mg total) by mouth every 8 (eight) hours as needed for nausea or vomiting.   rivaroxaban 20 MG Tabs tablet Commonly known as: Xarelto Take 1 tablet daily with evening meal   rosuvastatin 20 MG tablet Commonly known as: CRESTOR Take 1 tablet (20 mg total) by mouth daily at 6 PM.   traMADol 50 MG tablet Commonly known as: ULTRAM Take 50 mg by mouth every 6 (six) hours as needed for moderate pain.   triamcinolone cream 0.1 % Commonly known as: KENALOG Apply 1 application topically 2 (two) times daily. What changed:   when to take this  reasons to take this   vitamin B-12 100 MCG tablet Commonly known as: CYANOCOBALAMIN Take 100 mcg by mouth daily.      Allergies  Allergen Reactions  . Lopressor [Metoprolol Tartrate]  Swelling    Angioedema  . Allegra Allergy [Fexofenadine Hcl] Hives  . Dilaudid [Hydromorphone]     Hallucinations  . Nsaids     On Xarelto  . Gabapentin (Once-Daily) Rash  . Lipitor [Atorvastatin] Diarrhea   Follow-up Information    [name], [name], DO. Call in 1 day(s).   Specialty: Family Medicine Why: Please call for a post hospital follow up appointment Contact information: [place] [address] [phone]        Constance Haw, MD Follow up.   Specialty: Cardiology Contact information: [address] [place] [address] [phone]        Satira Sark, MD. Call in 1 day(s).   Specialty: Cardiology Why: PLEASE CALL AND MAKE AN APPOINTMENT WITHIN A WEEK DUE TO CHANGES IN YOUR CARDIAC MEDICATIONS. Contact information: [place] [address] [phone]        Cirigliano, [place], DO. Call on 11/11/2019.   Specialty: Gastroenterology Why: at 2pm Contact information: [address] [place] [address] [phone]            The results of significant diagnostics from this hospitalization (including imaging, microbiology, ancillary and laboratory) are listed below for reference.    Significant Diagnostic Studies: CT Abdomen Pelvis W Contrast  Result Date: 10/11/2019 CLINICAL DATA:  Abdominal distension. Elevated lipase. Referred by primary care physician for possible pancreatitis. Abdominal pain and nausea. EXAM: CT ABDOMEN AND PELVIS WITH CONTRAST TECHNIQUE: Multidetector CT imaging of the abdomen and pelvis was performed using the standard protocol following bolus administration of intravenous contrast. CONTRAST:  122mL OMNIPAQUE IOHEXOL 300 MG/ML  SOLN COMPARISON:  CT 09/10/2019 FINDINGS: Lower chest: Minor hypoventilatory changes and left lower lobe scarring. No pleural effusion or acute airspace disease. Hepatobiliary: No focal liver abnormality is seen. No gallstones, gallbladder wall thickening, or biliary dilatation. Pancreas: Mild peripancreatic fat stranding is most prominent about the head and uncinate process, to a lesser extent pancreatic body. Cystic lesion within the pancreatic head measures 1.8 x 1.7 x 1.8 cm, previously 2.2 x 1.9 x 2.0 cm. No new peripancreatic fluid collection. Mild prominence of the pack small pancreatic duct at 4 mm. No pancreatic calcifications. Spleen: Normal in size without focal abnormality. The splenic vein is patent. Adrenals/Urinary Tract: Left adrenal thickening without dominant nodule. Right adrenal gland is normal. 6 mm stone in the right proximal ureter without associated hydronephrosis, stone unchanged in position from prior exam. No perinephric edema. There is symmetric excretion on delayed phase imaging. Small low-density lesions within both kidneys are too small to characterize but likely cysts. Urinary bladder is partially distended. No bladder stone or wall thickening. Mild pelvic floor descent with small  cystocele. Stomach/Bowel: Wall thickening of the pre pyloric stomach and proximal duodenum, may be in part reactive. Stomach is distended. Small bowel is otherwise decompressed. No obstruction. Normal appendix. Mild colonic diverticulosis without diverticulitis. Small volume of colonic stool. Vascular/Lymphatic: Infrarenal aortic aneurysm at 3.1 cm with mural thrombus and ulcerated plaque. No periaortic stranding. Moderate aortic atherosclerosis. The portal and splenic veins are patent. Mesenteric vessels are grossly patent. Dilatation of the ovarian veins with contrast refluxing, left greater than right. Left ovarian vein measures 7 mm. Small peripancreatic nodes, largest measuring 10 mm, likely reactive. Reproductive: Prominent periuterine and adnexal vascularity. Uterus otherwise quiescent. No suspicious adnexal mass. Other: Mild peripancreatic edema tracks in the central mesentery. No significant ascites. No free air. Musculoskeletal: There  are no acute or suspicious osseous abnormalities. Mild degenerative change in the spine. IMPRESSION: 1. Acute pancreatitis. Cystic lesion in the pancreatic head measures 1.8 x 1.7 x 1.8 cm, previously 2.2 x 1.9 x 2.0 cm on CT 1 month ago, favoring pseudocyst with slight improvement. 2. Wall thickening of the pre pyloric stomach and proximal duodenum, may be in part reactive. 3. Infrarenal aortic aneurysm measuring 3.1 cm. Mural thrombus with ulcerated plaque, unchanged from recent prior exam. 4. Dilatation of the ovarian veins with contrast refluxing, left greater than right. This can be seen with pelvic congestion syndrome in the appropriate clinical setting. 5. Right proximal ureteral calculus, unchanged in position from prior exam. This is likely nonobstructing, as there is no hydronephrosis in symmetric excretion on delayed phase imaging. 6. Colonic diverticulosis without diverticulitis. Aortic Atherosclerosis (ICD10-I70.0). Aortic aneurysm NOS (ICD10-I71.9).  Electronically Signed   By: Keith Rake M.D.   On: 10/11/2019 20:37   CUP PACEART REMOTE DEVICE CHECK  Result Date: 09/19/2019 [facility] summary report received. Battery status OK. Normal device function. No new symptom episodes, tachy episodes, brady, or pause episodes. No new AF episodes. Monthly summary reports and ROV/PRN Kathy Breach, RN, CCDS, [employer]   Microbiology: Recent Results (from the past 240 hour(s))  SARS CORONAVIRUS 2 (TAT 6-24 HRS) Nasopharyngeal Nasopharyngeal Swab     Status: None   Collection Time: 10/11/19  8:04 PM   Specimen: Nasopharyngeal Swab  Result Value Ref Range Status   SARS Coronavirus 2 NEGATIVE NEGATIVE Final    Comment: (NOTE) SARS-CoV-2 target nucleic acids are NOT DETECTED. The SARS-CoV-2 RNA is generally detectable in upper and lower respiratory specimens during the acute phase of infection. Negative results do not preclude SARS-CoV-2 infection, do not rule out co-infections with other pathogens, and should not be used as the sole basis for treatment or other patient management decisions. Negative results must be combined with clinical observations, patient history, and epidemiological information. The expected result is Negative. Fact Sheet for Patients: SugarRoll.be Fact Sheet for Healthcare Providers: https://www.woods-mathews.com/ This test is not yet approved or cleared by the [place] FDA and  has been authorized for detection and/or diagnosis of SARS-CoV-2 by FDA under an Emergency Use Authorization (EUA). This EUA will remain  in effect (meaning this test can be used) for the duration of the COVID-19 declaration under Section 56 4(b)(1) of the Act, 21 U.S.C. section 360bbb-3(b)(1), unless the authorization is terminated or revoked sooner. Performed at [facility], [place] [address] [place], [place] [address]      Labs: Basic Metabolic Panel: Recent Labs  Lab  10/11/19 1231 10/12/19 0612 10/13/19 0532 10/14/19 0516  NA 137 136 136 139  K 4.4 3.8 4.1 3.7  CL 100 105 108 108  CO2 28 24 19* 23  GLUCOSE 79 96 83 92  BUN 14 15 18 9   CREATININE 0.77 0.55 0.50 0.48  CALCIUM 10.5 8.7* 8.2* 8.7*   Liver Function Tests: Recent Labs  Lab 10/11/19 1231 10/12/19 0612 10/13/19 0532 10/14/19 0516  AST 16 14* 14* 14*  ALT 10 10 11 12   ALKPHOS 73 57 45 47  BILITOT 0.5 0.4 0.4 0.7  PROT 7.7 6.4* 5.7* 6.1*  ALBUMIN 4.3 3.3* 2.8* 3.3*   Recent Labs  Lab 10/11/19 1231 10/13/19 0532 10/14/19 0516  LIPASE 2,834.0* 120* 116*  AMYLASE 677*  --   --    No results for input(s): AMMONIA in the last 168 hours. CBC: Recent Labs  Lab 10/11/19 1231 10/12/19 0612 10/13/19 0532 10/14/19 0516  WBC 10.1 9.3 6.8 10.5  NEUTROABS 6.2  --  5.6 7.8*  HGB 15.4* 13.5 12.8 12.3  HCT 46.2* 41.8 41.0 37.4  MCV 96.0 99.5 102.2* 98.2  PLT 430.0* 324 307 376   Cardiac Enzymes: No results for input(s): CKTOTAL, CKMB, CKMBINDEX, TROPONINI in the last 168 hours. BNP: BNP (last 3 results) No results for input(s): BNP in the last 8760 hours.  ProBNP (last 3 results) No results for input(s): PROBNP in the last 8760 hours.  CBG: No results for input(s): GLUCAP in the last 168 hours.     Signed:  Kayleen Memos, MD [facility] 10/14/2019, 3:28 PM

## 2019-10-14 NOTE — Progress Notes (Signed)
  Patient tolerated solids for lunch. Feel okay and wants to go home. At this point the etiology of pancreatitis is unclear. Recommend holding home lasix in case it is the culprit. I have made her a follow up with Dr. Bryan Lemma at [place] office on 11/11/19 at 2pm.

## 2019-10-14 NOTE — Discharge Instructions (Signed)
Acute Pancreatitis  Acute pancreatitis happens when the pancreas gets swollen. The pancreas is a large gland in the body that helps to control blood sugar. It also makes enzymes that help to digest food. This condition can last a few days and cause serious problems. The lungs, heart, and kidneys may stop working. What are the causes? Causes include:  Alcohol abuse.  Drug abuse.  Gallstones.  A tumor in the pancreas. Other causes include:  Some medicines.  Some chemicals.  Diabetes.  An infection.  Damage caused by an accident.  The poison (venom) from a scorpion bite.  Belly (abdominal) surgery.  The body's defense system (immune system) attacking the pancreas (autoimmune pancreatitis).  Genes that are passed from parent to child (inherited). In some cases, the cause is not known. What are the signs or symptoms?  Pain in the upper belly that may be felt in the back. The pain may be very bad.  Swelling of the belly.  Feeling sick to your stomach (nauseous) and throwing up (vomiting).  Fever. How is this treated? You will likely have to stay in the hospital. Treatment may include:  Pain medicine.  Fluid through an IV tube.  Placing a tube in the stomach to take out the stomach contents. This may help you stop throwing up.  Not eating for 3-4 days.  Antibiotic medicines, if you have an infection.  Treating any other problems that may be the cause.  Steroid medicines, if your problem is caused by your defense system attacking your body's own tissues.  Surgery. Follow these instructions at home: Eating and drinking   Follow instructions from your doctor about what to eat and drink.  Eat foods that do not have a lot of fat in them.  Eat small meals often. Do not eat big meals.  Drink enough fluid to keep your pee (urine) pale yellow.  Do not drink alcohol if it caused your condition. Medicines  Take over-the-counter and prescription medicines only  as told by your doctor.  Ask your doctor if the medicine prescribed to you: ? Requires you to avoid driving or using heavy machinery. ? Can cause trouble pooping (constipation). You may need to take steps to prevent or treat trouble pooping:  Take over-the-counter or prescription medicines.  Eat foods that are high in fiber. These include beans, whole grains, and fresh fruits and vegetables.  Limit foods that are high in fat and sugar. These include fried or sweet foods. General instructions  Do not use any products that contain nicotine or tobacco, such as cigarettes, e-cigarettes, and chewing tobacco. If you need help quitting, ask your doctor.  Get plenty of rest.  Check your blood sugar at home as told by your doctor.  Keep all follow-up visits as told by your doctor. This is important. Contact a doctor if:  You do not get better as quickly as expected.  You have new symptoms.  Your symptoms get worse.  You have pain or weakness that lasts a long time.  You keep feeling sick to your stomach.  You get better and then you have pain again.  You have a fever. Get help right away if:  You cannot eat or keep fluids down.  Your pain gets very bad.  Your skin or the white part of your eyes turns yellow.  You have sudden swelling in your belly.  You throw up.  You feel dizzy or you pass out (faint).  Your blood sugar is high (over 300  mg/dL). Summary  Acute pancreatitis happens when the pancreas gets swollen.  This condition is often caused by alcohol abuse, drug abuse, or gallstones.  You will likely have to stay in the hospital for treatment. This information is not intended to replace advice given to you by your health care provider. Make sure you discuss any questions you have with your health care provider. Document Revised: 07/02/2018 Document Reviewed: 07/02/2018 Elsevier Patient Education  2020 [employer].   Pancreatitis Eating Plan Pancreatitis is  when your pancreas becomes irritated and swollen (inflamed). The pancreas is a small organ located behind your stomach. It helps your body digest food and regulate your blood sugar. Pancreatitis can affect how your body digests food, especially foods with fat. You may also have other symptoms such as abdominal pain or nausea. When you have pancreatitis, following a low-fat eating plan may help you manage symptoms and recover more quickly. Work with your health care provider or a diet and nutrition specialist (dietitian) to create an eating plan that is right for you. What are tips for following this plan? Reading food labels Use the information on food labels to help keep track of how much fat you eat:  Check the serving size.  Look for the amount of total fat in grams (g) in one serving. ? Low-fat foods have 3 g of fat or less per serving. ? Fat-free foods have 0.5 g of fat or less per serving.  Keep track of how much fat you eat based on how many servings you eat. ? For example, if you eat two servings, the amount of fat you eat will be two times what is listed on the label. Shopping   Buy low-fat or nonfat foods, such as: ? Fresh, frozen, or canned fruits and vegetables. ? Grains, including pasta, bread, and rice. ? Lean meat, poultry, fish, and other protein foods. ? Low-fat or nonfat dairy.  Avoid buying bakery products and other sweets made with whole milk, butter, and eggs.  Avoid buying snack foods with added fat, such as anything with butter or cheese flavoring. Cooking  Remove skin from poultry, and remove extra fat from meat.  Limit the amount of fat and oil you use to 6 teaspoons or less per day.  Cook using low-fat methods, such as boiling, broiling, grilling, steaming, or baking.  Use spray oil to cook. Add fat-free chicken broth to add flavor and moisture.  Avoid adding cream to thicken soups or sauces. Use other thickeners such as corn starch or tomato paste. Meal  planning   Eat a low-fat diet as told by your dietitian. For most people, this means having no more than 55-65 grams of fat each day.  Eat small, frequent meals throughout the day. For example, you may have 5-6 small meals instead of 3 large meals.  Drink enough fluid to keep your urine pale yellow.  Do not drink alcohol. Talk to your health care provider if you need help stopping.  Limit how much caffeine you have, including black coffee, black and green tea, caffeinated soft drinks, and energy drinks. General information  Let your health care provider or dietitian know if you have unplanned weight loss on this eating plan.  You may be instructed to follow a clear liquid diet during a flare of symptoms. Talk with your health care provider about how to manage your diet during symptoms of a flare.  Take any vitamins or supplements as told by your health care provider.  Work  with a dietitian, especially if you have other conditions such as obesity or diabetes mellitus. What foods should I avoid? Fruits Fried fruits. Fruits served with butter or cream. Vegetables Fried vegetables. Vegetables cooked with butter, cheese, or cream. Grains Biscuits, waffles, donuts, pastries, and croissants. Pies and cookies. Butter-flavored popcorn. Regular crackers. Meats and other protein foods Fatty cuts of meat. Poultry with skin. Organ meats. Bacon, sausage, and cold cuts. Whole eggs. Nuts and nut butters. Dairy Whole and 2% milk. Whole milk yogurt. Whole milk ice cream. Cream and half-and-half. Cream cheese. Sour cream. Cheese. Beverages Wine, beer, and liquor. The items listed above may not be a complete list of foods and beverages to avoid. Contact a dietitian for more information. Summary  Pancreatitis can affect how your body digests food, especially foods with fat.  When you have pancreatitis, it is recommended that you follow a low-fat eating plan to help you recover more quickly and  manage symptoms. For most people, this means limiting fat to no more than 55-65 grams per day.  Do not drink alcohol. Limit the amount of caffeine you have, and drink enough fluid to keep your urine pale yellow. This information is not intended to replace advice given to you by your health care provider. Make sure you discuss any questions you have with your health care provider. Document Revised: 01/03/2019 Document Reviewed: 12/19/2017 Elsevier Patient Education  2020 [employer]  

## 2019-10-14 NOTE — Evaluation (Signed)
Physical Therapy One Time Evaluation Patient Details Name: [name] MRN: [id number] DOB: 1961/02/08 Today's Date: 10/14/2019   History of Present Illness  59 y.o. 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  Pt admitted for recurrent pseudocyst pancreatitis  Clinical Impression  Patient evaluated by Physical Therapy with no further acute PT needs identified. All education has been completed and the patient has no further questions.  Pt plans to restart OPPT when available (states she still has 6-7 visits left on her insurance after CVA).  Pt mobilizing well and hopeful to see GI MD asap (awaiting plan and wants to d/c home). See below for any follow-up Physical Therapy or equipment needs. PT is signing off. Thank you for this referral.     Follow Up Recommendations Outpatient PT(pt still has OP PT visits from previous stroke however she is awaiting aquatherapy)    Equipment Recommendations  None recommended by PT    Recommendations for Other Services       Precautions / Restrictions Precautions Precautions: None      Mobility  Bed Mobility Overal bed mobility: Modified Independent                Transfers Overall transfer level: Modified independent                  Ambulation/Gait Ambulation/Gait assistance: Modified independent (Device/Increase time) Gait Distance (Feet): 400 Feet Assistive device: None Gait Pattern/deviations: Decreased stance time - right     General Gait Details: residual deficits from previous stroke affecting gait pattern however pt steady and does not require assistive device  Stairs            Wheelchair Mobility    Modified Rankin (Stroke Patients Only)       Balance                                             Pertinent  Vitals/Pain Pain Assessment: Faces Faces Pain Scale: Hurts little more Pain Location: abdomen Pain Descriptors / Indicators: Sore Pain Intervention(s): Monitored during session    Home Living Family/patient expects to be discharged to:: Private residence Living Arrangements: Spouse/significant other Available Help at Discharge: Family;Available 24 hours/day Type of Home: House       Home Layout: One level Home Equipment: Cane - quad;Bedside commode;Wheelchair - [employer] - 2 wheels      Prior Function Level of Independence: Independent               Hand Dominance        Extremity/Trunk Assessment   Upper Extremity Assessment Upper Extremity Assessment: RUE deficits/detail RUE Deficits / Details: residual deficits from previous CVA, pt self assists R UE    Lower Extremity Assessment Lower Extremity Assessment: RLE deficits/detail RLE Deficits / Details: residual deficits from previous CVA       Communication   Communication: No difficulties  Cognition Arousal/Alertness: Awake/alert Behavior During Therapy: WFL for tasks assessed/performed Overall Cognitive Status: Within Functional Limits for tasks assessed                                        General Comments  Exercises     Assessment/Plan    PT Assessment All further PT needs can be met in the next venue of care  PT Problem List Decreased strength;Decreased range of motion;Decreased activity tolerance       PT Treatment Interventions      PT Goals (Current goals can be found in the Care Plan section)  Acute Rehab PT Goals PT Goal Formulation: All assessment and education complete, DC therapy    Frequency     Barriers to discharge        Co-evaluation               AM-PAC PT "6 Clicks" Mobility  Outcome Measure Help needed turning from your back to your side while in a flat bed without using bedrails?: None Help needed moving from lying on your back to  sitting on the side of a flat bed without using bedrails?: None Help needed moving to and from a bed to a chair (including a wheelchair)?: None Help needed standing up from a chair using your arms (Stephenson.g., wheelchair or bedside chair)?: None Help needed to walk in hospital room?: None Help needed climbing 3-5 steps with a railing? : A Little 6 Click Score: 23    End of Session   Activity Tolerance: Patient tolerated treatment well Patient left: in bed;with call bell/phone within reach   PT Visit Diagnosis: Difficulty in walking, not elsewhere classified (R26.2)    Time: 1610-9604 PT Time Calculation (min) (ACUTE ONLY): 9 min   Charges:   PT Evaluation $PT Eval Low Complexity: 1 Low        Kati PT, DPT Acute Rehabilitation Services Office: [phone]  Haddy Mullinax,[name] 10/14/2019, 11:20 AM

## 2019-10-15 ENCOUNTER — Telehealth: Payer: Self-pay

## 2019-10-15 NOTE — Telephone Encounter (Signed)
Patient admitted for acute recurrent pancreatitis. Has f/u with GI on 11/11/19, calling cardiology today to schedule f/u.  Transition Care Management Follow-up Telephone Call  Date of discharge and from where: 10/11/2019-10/14/2019. [name]  How have you been since you were released from the hospital? "I'm tired, trying to get my energy back"  Any questions or concerns? Yes . Patient has questions regarding medications. She is concerned hospitalist discontinued Entresto and Lasix. Also restarted her on Crestor which she has not taken since November (held by [employer]). Pt to call cardiologist today for appt and inquire about medication changes.   Patient also concerned about coccyx area having no fatty tissue, area sore. Denies skin breakdown. Encouraged the use of donut pillow/bed pillow when sitting, rotate sides and move as much as possible. Also using cream/ointment provided at hospital.   Items Reviewed:  Did the pt receive and understand the discharge instructions provided? Yes   Medications obtained and verified? Yes   Any new allergies since your discharge? No   Dietary orders reviewed? Yes  Do you have support at home? Yes   Functional Questionnaire: (I = Independent and D = Dependent) ADLs: I  Bathing/Dressing- I  Meal Prep- I  Eating- I  Maintaining continence- I  Transferring/Ambulation- I  Managing Meds- I  Follow up appointments reviewed:   PCP Hospital f/u appt confirmed? Yes  Scheduled to see PCP on Monday, 10/21/2019 @ 1130, in person.   [facility] f/u appt confirmed? Yes  Scheduled to see GI on 11/11/2019, calling Cardiology today for appt.   Are transportation arrangements needed? No   If their condition worsens, is the pt aware to call PCP or go to the Emergency Dept.? Yes  Was the patient provided with contact information for the PCP's office or ED? Yes  Was to pt encouraged to call back with questions or concerns? Yes

## 2019-10-17 ENCOUNTER — Telehealth (INDEPENDENT_AMBULATORY_CARE_PROVIDER_SITE_OTHER): Payer: 59 | Admitting: Student

## 2019-10-17 ENCOUNTER — Encounter: Payer: Self-pay | Admitting: Student

## 2019-10-17 ENCOUNTER — Telehealth: Payer: Self-pay | Admitting: Licensed Clinical Social Worker

## 2019-10-17 VITALS — Ht 61.0 in | Wt 118.0 lb

## 2019-10-17 DIAGNOSIS — E785 Hyperlipidemia, unspecified: Secondary | ICD-10-CM

## 2019-10-17 DIAGNOSIS — I255 Ischemic cardiomyopathy: Secondary | ICD-10-CM

## 2019-10-17 DIAGNOSIS — I2510 Atherosclerotic heart disease of native coronary artery without angina pectoris: Secondary | ICD-10-CM

## 2019-10-17 DIAGNOSIS — I480 Paroxysmal atrial fibrillation: Secondary | ICD-10-CM

## 2019-10-17 DIAGNOSIS — I6523 Occlusion and stenosis of bilateral carotid arteries: Secondary | ICD-10-CM

## 2019-10-17 MED ORDER — ENTRESTO 24-26 MG PO TABS: 1.0000 | ORAL_TABLET | Freq: Two times a day (BID) | ORAL | Status: DC

## 2019-10-17 MED ORDER — PRAVASTATIN SODIUM 20 MG PO TABS: 20.0000 mg | ORAL_TABLET | Freq: Every day | ORAL | 3 refills | Status: DC

## 2019-10-17 NOTE — Patient Instructions (Signed)
Medication Instructions:  Your physician has recommended you make the following change in your medication:  Stop Taking Crestor  Start Taking Entresto 24-26 mg Two Times Daily   *If you need a refill on your cardiac medications before your next appointment, please call your pharmacy*  Lab Work: Your physician recommends that you return for lab work in: Fasting in 2 Months  If you have labs (blood work) drawn today and your tests are completely normal, you will receive your results only by: [name] MyChart Message (if you have MyChart) OR . A paper copy in the mail If you have any lab test that is abnormal or we need to change your treatment, we will call you to review the results.  Testing/Procedures: NONE   Follow-Up: At [facility], you and your health needs are our priority.  As part of our continuing mission to provide you with exceptional heart care, we have created designated Provider Care Teams.  These Care Teams include your primary Cardiologist (physician) and Advanced Practice Providers (APPs -  Physician Assistants and Nurse Practitioners) who all work together to provide you with the care you need, when you need it.  Your next appointment:   3 month(s)  The format for your next appointment:   In Person  Provider:   [name], MD  Other Instructions Thank you for choosing [facility]!

## 2019-10-17 NOTE — Telephone Encounter (Signed)
CSW referred to assist patient with obtaining a BP cuff. CSW contacted patient to inform cuff will be delivered to home. Patient grateful for support and assistance. CSW available as needed. Jackie Lillyen Schow, LCSW, CCSW-MCS [phone]  

## 2019-10-17 NOTE — Progress Notes (Signed)
Virtual Visit via Telephone Note   This visit type was conducted due to national recommendations for restrictions regarding the COVID-19 Pandemic (e.g. social distancing) in an effort to limit this patient's exposure and mitigate transmission in our community.  Due to her co-morbid illnesses, this patient is at least at moderate risk for complications without adequate follow up.  This format is felt to be most appropriate for this patient at this time.  The patient did not have access to video technology/had technical difficulties with video requiring transitioning to audio format only (telephone).  All issues noted in this document were discussed and addressed.  No physical exam could be performed with this format.  Please refer to the patient's chart for her  consent to telehealth for [facility].   Date:  10/17/2019   ID:  [name], DOB 1961-06-06, MRN [id number]  Patient Location: Home Provider Location: Office  PCP:  [name], DO  Cardiologist:  [name], MD  Electrophysiologist:  Constance Haw, MD   Evaluation Performed:  Follow-Up Visit  Chief Complaint: Recent Hospitalization; Medication Changes  History of Present Illness:    [name] is a 59 y.o. female with past medical history of CAD (s/p NSTEMI in 10/2017 with 3-vessel CAD by cath and CABG on 11/23/2017 with LIMA-LAD and SVG-OM),PAF (on Xarelto),ischemic cardiomyopathy (EF 35-40% by echo in 10/2017, EF improved to 50-55% by repeat echo in 07/2019), carotid artery stenosis,HLD, and prior CVA who presents for a hospital follow-up telehealth visit.  She had been admitted to [facility] for acute pancreatitis in 08/2019 and symptoms resolved with conservative therapy. She did have follow-up visit with myself on 09/24/2019 and reported her abdominal pain had improved and everything was stable from a cardiac perspective. No changes were made to her medication regimen and she was continued on  Coreg 3.125 mg twice daily, Entresto 24-26 mg twice daily and Lasix 10 mg daily. She was not on ASA given the need for anticoagulation and statin therapy had been held by her allergist.  She presented back to the ED on 10/11/2019 for evaluation of worsening abdominal pain and lipase was elevated to 2834 when checked by GI as an outpatient. Her symptoms improved with administration of steroids and IV fluids. By review of the GI consult note, there was a question of autoimmune etiology and also that Lasix had been associated with pancreatitis in some patients in the past. Coreg was continued at the time of discharge but it also appears Entresto was stopped given hypotension.   She reports her abdominal pain has improved since hospital discharge and she is monitoring her food consumption very closely. She denies any recurrent nausea or vomiting. She denies any dyspnea on exertion, orthopnea, PND, chest pain or dyspnea on exertion. She does have intermittent ankle edema but denies any pitting edema.  During her hospitalization, Lasix was discontinued given the concern this was contributing to her pancreatitis. It appears [name] was stopped as outlined above. She was restarted on Crestor during admission but this had previously been discontinued by her Allergist and she says she has not taken this since returning home.  The patient does not have symptoms concerning for COVID-19 infection (fever, chills, cough, or new shortness of breath).    Past Medical History:  Diagnosis Date  . Angio-edema   . Arthritis   . CAD (coronary artery disease)    a. s/p NSTEMI in 10/2017 with cath showing LM disease --> s/p CABG on 11/23/2017  with LIMA-LAD and SVG-OM.   [name] Hay fever   . Ischemic cardiomyopathy    a. EF 35-40% by echo in 10/2017  . NSTEMI (non-ST elevated myocardial infarction) ([place])   . PAF (paroxysmal atrial fibrillation) ([place])    Diagnosed by ILR  . Pneumonia 11/16/2017  . Stroke [facility]) 08/23/2017    Left MCA infarct status post TPA and thrombectomy  . Urticaria    Past Surgical History:  Procedure Laterality Date  . CORONARY ARTERY BYPASS GRAFT N/A 11/23/2017   Procedure: CORONARY ARTERY BYPASS GRAFTING (CABG) x 2, ON PUMP, USING LEFT INTERNAL MAMMARY ARTERY AND RIGHT GREATER SAPHENOUS VEIN HARVESTED ENDOSCOPICALLY;  Surgeon: Gaye Pollack, MD;  Location: [place];  Service: Open Heart Surgery;  Laterality: N/A;  . CRYOABLATION     cervical  . IR ANGIO INTRA EXTRACRAN SEL COM CAROTID INNOMINATE UNI R MOD SED  08/21/2017  . IR ANGIO VERTEBRAL SEL SUBCLAVIAN INNOMINATE UNI R MOD SED  08/21/2017  . IR PERCUTANEOUS ART THROMBECTOMY/INFUSION INTRACRANIAL INC DIAG ANGIO  08/21/2017  . IR RADIOLOGIST EVAL & MGMT  10/30/2017  . LOOP RECORDER INSERTION N/A 08/28/2017   Procedure: LOOP RECORDER INSERTION;  Surgeon: Constance Haw, MD;  Location: [place] [facility];  Service: Cardiovascular;  Laterality: N/A;  . RADIOLOGY WITH ANESTHESIA N/A 08/21/2017   Procedure: RADIOLOGY WITH ANESTHESIA;  Surgeon: Luanne Bras, MD;  Location: [place];  Service: Radiology;  Laterality: N/A;  . RIGHT/LEFT HEART CATH AND CORONARY ANGIOGRAPHY N/A 11/20/2017   Procedure: RIGHT/LEFT HEART CATH AND CORONARY ANGIOGRAPHY;  Surgeon: Jettie Booze, MD;  Location: [place] [facility];  Service: Cardiovascular;  Laterality: N/A;  . TEE WITHOUT CARDIOVERSION N/A 08/28/2017   Procedure: TRANSESOPHAGEAL ECHOCARDIOGRAM (TEE);  Surgeon: Fay Records, MD;  Location: [place];  Service: Cardiovascular;  Laterality: N/A;  . TEE WITHOUT CARDIOVERSION N/A 11/23/2017   Procedure: TRANSESOPHAGEAL ECHOCARDIOGRAM (TEE);  Surgeon: Gaye Pollack, MD;  Location: [place];  Service: Open Heart Surgery;  Laterality: N/A;  . TUBAL LIGATION       Current Meds  Medication Sig  . acetaminophen (TYLENOL) 325 MG tablet Take 2 tablets (650 mg total) by mouth every 6 (six) hours as needed for mild pain or moderate pain (or Fever >/=  101).  [name] azelastine (ASTELIN) 0.1 % nasal spray Place 1-2 sprays into both nostrils 2 (two) times daily as needed for rhinitis. Use in each nostril as directed  . baclofen (LIORESAL) 10 MG tablet TAKE 1 TABLET BY MOUTH THREE TIMES DAILY (Patient taking differently: Take 10 mg by mouth 2 (two) times daily. )  . calcium carbonate (TUMS - DOSED IN MG ELEMENTAL CALCIUM) 500 MG chewable tablet Chew 1 tablet by mouth 2 (two) times daily.   . carvedilol (COREG) 3.125 MG tablet Take 1 tablet by mouth twice daily  . cholecalciferol (VITAMIN D) 1000 units tablet Take 1,000 Units by mouth daily.  . famotidine (PEPCID) 20 MG tablet Take 20 mg by mouth daily.   . ferrous sulfate 325 (65 FE) MG tablet Take 325 mg by mouth 2 (two) times a week. Monday  and Thursday.  . hydrOXYzine (ATARAX/VISTARIL) 10 MG tablet Take 1 tablet (10 mg total) by mouth at bedtime. (Patient taking differently: Take 10 mg by mouth at bedtime as needed for anxiety. )  . levocetirizine (XYZAL) 5 MG tablet Take 1 tablet (5 mg total) by mouth every evening.  . ondansetron (ZOFRAN ODT) 4 MG disintegrating tablet Take 1 tablet (4 mg  total) by mouth every 8 (eight) hours as needed for nausea or vomiting.  . rivaroxaban (XARELTO) 20 MG TABS tablet Take 1 tablet daily with evening meal  . traMADol (ULTRAM) 50 MG tablet Take 50 mg by mouth every 6 (six) hours as needed for moderate pain.  [name] triamcinolone cream (KENALOG) 0.1 % Apply 1 application topically 2 (two) times daily. (Patient taking differently: Apply 1 application topically 2 (two) times daily as needed (rash). )  . vitamin B-12 (CYANOCOBALAMIN) 100 MCG tablet Take 100 mcg by mouth daily.     Allergies:   Lopressor [metoprolol tartrate], Allegra allergy [fexofenadine hcl], Dilaudid [hydromorphone], Nsaids, Gabapentin (once-daily), and Lipitor [atorvastatin]   Social History   Tobacco Use  . Smoking status: Former Smoker    Packs/day: 0.10    Years: 37.00    Pack years: 3.70     Types: Cigarettes    Quit date: 07/17/2018    Years since quitting: 1.2  . Smokeless tobacco: Never Used  Substance Use Topics  . Alcohol use: No  . Drug use: No     Family Hx: The patient's family history includes Breast cancer (age of onset: 55) in her cousin; Diabetes type II in her mother; Heart attack in her father; Hyperlipidemia in her mother; Hypertension in her brother and mother; Urticaria in her mother. There is no history of Immunodeficiency, Eczema, Atopy, Asthma, Angioedema, Allergic rhinitis, Colon cancer, or Esophageal cancer.  ROS:   Please see the history of present illness.     All other systems reviewed and are negative.   Prior CV studies:   The following studies were reviewed today:  Echocardiogram: 07/2019 IMPRESSIONS    1. Left ventricular ejection fraction, by visual estimation, is 50 to 55%. The left ventricle has low normal function. There is no left ventricular hypertrophy.  2. Left ventricular diastolic parameters are consistent with Grade I diastolic dysfunction (impaired relaxation).  3. Hypokinesis/ akinesis of the basal inferior/inferior walls.  4. Global right ventricle has normal systolic function.The right ventricular size is normal. No increase in right ventricular wall thickness.  5. Left atrial size was normal.  6. Right atrial size was normal.  7. The mitral valve is normal in structure. No evidence of mitral valve regurgitation.  8. The tricuspid valve is normal in structure. Tricuspid valve regurgitation is not demonstrated.  9. The aortic valve is tricuspid. Aortic valve regurgitation is not visualized. Mild to moderate aortic valve sclerosis/calcification without any evidence of aortic stenosis. 10. The pulmonic valve was not well visualized. Pulmonic valve regurgitation is not visualized.  Labs/Other Tests and Data Reviewed:    EKG:  An ECG dated 09/10/2019 was personally reviewed today and demonstrated: Sinus tachycardia, HR 101  with inferior Q-waves but no acute ST changes.   Recent Labs: 10/14/2019: ALT 12; BUN 9; Creatinine, Ser 0.48; Hemoglobin 12.3; Platelets 376; Potassium 3.7; Sodium 139   Recent Lipid Panel Lab Results  Component Value Date/Time   CHOL 203 (H) 09/11/2019 04:53 AM   TRIG 85 09/11/2019 04:53 AM   HDL 28 (L) 09/11/2019 04:53 AM   CHOLHDL 7.3 09/11/2019 04:53 AM   LDLCALC 158 (H) 09/11/2019 04:53 AM    Wt Readings from Last 3 Encounters:  10/17/19 118 lb (53.5 kg)  10/14/19 126 lb 12.2 oz (57.5 kg)  10/11/19 119 lb 6 oz (54.1 kg)     Objective:    Vital Signs:  Ht 5\' 1"  (1.549 m)   Wt 118 lb (53.5 kg)  LMP  (LMP Unknown)   BMI 22.30 kg/m    General: Pleasant female sounding in NAD Psych: Normal affect. Neuro: Alert and oriented X 3. Lungs:  Resp regular and unlabored while talking on the phone.   ASSESSMENT & PLAN:    1. CAD - s/p NSTEMI in 10/2017 with 3-vessel CAD by cath and CABG on 11/23/2017 with LIMA-LAD and SVG-OM. She denies any recent chest pain or dyspnea on exertion. - Continue Coreg 3.125 mg twice daily and plan to restart statin therapy as outlined below.  She is not on ASA given the need for anticoagulation.  2. 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 She is no longer on Lasix as this was discontinued during her admission due to this being a possible contributor to her recurrent pancreatitis. She is on Coreg 3.125 mg twice daily. Will plan to add back Entresto 24-26mg  BID as BP had been stable prior to discharge. Will reach out to Social Work to see if a BP cuff can be mailed to the patient.   3. Paroxysmal Atrial Fibrillation - She denies any recent palpitations and was in normal sinus rhythm during her recent admission. Continue Coreg 3.125 mg twice daily for rate control.   - She denies any evidence of active bleeding. Continue Xarelto for  anticoagulation.    4. Carotid Artery Stenosis - dopplers in 03/2020showed 1-39% RICA stenosis and 123456 LICA stenosis. Continue to follow.   5. HLD - FLP in 08/2019 showed total cholesterol 203, triglycerides 85, HDL 28 and LDL 158. She had previously been on Crestor but this was stopped by her Allergist. Intolerant to Lipitor in the past. She is open to trying Pravastatin 20 mg daily. Will recheck FLP and LFT's in 8 weeks. If intolerant to Pravastatin, would refer to the Lipid Clinic given her history of CAD and prior CVA.   COVID-19 Education: The signs and symptoms of COVID-19 were discussed with the patient and how to seek care for testing (follow up with PCP or arrange E-visit).  The importance of social distancing was discussed today.  Time:   Today, I have spent 22 minutes with the patient with telehealth technology discussing the above problems.     Medication Adjustments/Labs and Tests Ordered: Current medicines are reviewed at length with the patient today.  Concerns regarding medicines are outlined above.   Tests Ordered: Orders Placed This Encounter  Procedures  . Lipid Profile  . Hepatic function panel    Medication Changes: Meds ordered this encounter  Medications  . pravastatin (PRAVACHOL) 20 MG tablet    Sig: Take 1 tablet (20 mg total) by mouth daily.    Dispense:  30 tablet    Refill:  3    Order Specific Question:   Supervising Provider    Answer:   Dorothy Spark [id number]  . sacubitril-valsartan (ENTRESTO) 24-26 MG    Sig: Take 1 tablet by mouth 2 (two) times daily.    Dispense:  60 tablet    Order Specific Question:   Supervising Provider    Answer:   Dorothy Spark [id number]    Follow Up: Keep scheduled in-office follow-up in 12/2019.  Signed, Erma Heritage, PA-C  10/17/2019 2:04 PM    [place]

## 2019-10-21 ENCOUNTER — Other Ambulatory Visit: Payer: Self-pay

## 2019-10-21 ENCOUNTER — Ambulatory Visit (INDEPENDENT_AMBULATORY_CARE_PROVIDER_SITE_OTHER): Payer: 59 | Admitting: Family Medicine

## 2019-10-21 ENCOUNTER — Encounter: Payer: Self-pay | Admitting: Family Medicine

## 2019-10-21 ENCOUNTER — Telehealth: Payer: Self-pay

## 2019-10-21 ENCOUNTER — Ambulatory Visit (INDEPENDENT_AMBULATORY_CARE_PROVIDER_SITE_OTHER): Payer: 59 | Admitting: *Deleted

## 2019-10-21 VITALS — BP 108/73 | HR 85 | Temp 98.2°F | Resp 16 | Ht 61.0 in | Wt 118.2 lb

## 2019-10-21 DIAGNOSIS — E063 Autoimmune thyroiditis: Secondary | ICD-10-CM

## 2019-10-21 DIAGNOSIS — R946 Abnormal results of thyroid function studies: Secondary | ICD-10-CM | POA: Insufficient documentation

## 2019-10-21 DIAGNOSIS — K861 Other chronic pancreatitis: Secondary | ICD-10-CM | POA: Diagnosis not present

## 2019-10-21 DIAGNOSIS — R350 Frequency of micturition: Secondary | ICD-10-CM | POA: Diagnosis not present

## 2019-10-21 DIAGNOSIS — R32 Unspecified urinary incontinence: Secondary | ICD-10-CM

## 2019-10-21 DIAGNOSIS — I63512 Cerebral infarction due to unspecified occlusion or stenosis of left middle cerebral artery: Secondary | ICD-10-CM

## 2019-10-21 DIAGNOSIS — L0591 Pilonidal cyst without abscess: Secondary | ICD-10-CM

## 2019-10-21 DIAGNOSIS — L509 Urticaria, unspecified: Secondary | ICD-10-CM

## 2019-10-21 DIAGNOSIS — H938X3 Other specified disorders of ear, bilateral: Secondary | ICD-10-CM | POA: Diagnosis not present

## 2019-10-21 HISTORY — DX: Pilonidal cyst without abscess: L05.91

## 2019-10-21 LAB — TSH: TSH: 2.56 u[IU]/mL (ref 0.35–4.50)

## 2019-10-21 LAB — T3, FREE: T3, Free: 3.4 pg/mL (ref 2.3–4.2)

## 2019-10-21 LAB — CUP PACEART REMOTE DEVICE CHECK
Date Time Interrogation Session: 20210124232157
Implantable Pulse Generator Implant Date: 20181203

## 2019-10-21 LAB — T4, FREE: Free T4: 0.82 ng/dL (ref 0.60–1.60)

## 2019-10-21 MED ORDER — MUPIROCIN CALCIUM 2 % EX CREA: 1.0000 "application " | TOPICAL_CREAM | Freq: Two times a day (BID) | CUTANEOUS | 0 refills | Status: DC

## 2019-10-21 MED ORDER — TRIAMCINOLONE ACETONIDE 0.1 % EX CREA: 1.0000 "application " | TOPICAL_CREAM | Freq: Two times a day (BID) | CUTANEOUS | 3 refills | Status: DC

## 2019-10-21 NOTE — Progress Notes (Signed)
[name] , 09-24-1961, 59 y.o., female MRN: [id number] Patient Care Team    Relationship Specialty Notifications Start End  [name], DO PCP - General Family Medicine  10/13/17   Constance Haw, MD PCP - Electrophysiology Cardiology Admissions 08/28/17   Satira Sark, MD PCP - Cardiology Cardiology Admissions 12/15/17   Rosalin Hawking, MD Consulting Physician Neurology  10/13/17   Jamse Arn, MD Consulting Physician Physical Medicine and Rehabilitation  10/13/17   Luanne Bras, MD Consulting Physician Interventional Radiology  10/13/17     Chief Complaint  Patient presents with  . Hospitalization Follow-up    Pt is here for Hospital D/C F/U. Has had some skin break down at tail bone from laying in bed at hospital and weight loss. Pt feels she has swelling in her ears .    Subjective:  [name]  is a 59 y.o. female presents for hospital follow up after recent admission on 10/11/2019 for primary diagnosis pancreatitis.  Patient was discharged on 10/14/2019 to home. Patients discharge summary has been reviewed, as well as all labs/image studies obtained during hospitalization.   Patients hospital course: Patient reports she had gone to her gastroenterology's office to follow-up on her pancreatitis and repeat labs were collected.  At that time her lipase was approximately 2800 and amylase 677. she was called and told to report to the emergency room.  Patient was having abdominal pain.  CT of her abdomen showed a slight improvement in her pancreatic pseudocyst size and no acute hepatobiliary findings.  She was treated with IV fluids, morphine and Zofran.  Admitted to the hospital in which she received IV Solu-Medrol for 2 days.  She was also provided with prednisone in which she was hesitant to start outpatient.  She had some lower and blood pressures and some of her medications were held while hospitalized.  However she has seen her cardiologist since and  medications have been restarted. Since hospital discharge patient reports she overall is doing well from her pancreatic standpoint.  She is tolerating p.o. does not report any abdominal pain today.  She is starting to get back to her normal routine.  Patient does endorse having bilateral itchy ears that are causing her a great deal of discomfort.  She states she feels she has scratched a place in her inner ear.  Patient also has been suffering from urticaria for over a year.  Thyroid panels have been normal.  She was sent to an allergist to rule out other potential causes of medications.  It was found she had elevated thyroid antibodies.  Repeat TSH/T3 and T4 have not been completed.  She is taking antihistamine for her urticaria.  She does endorse continued urticaria.  Patient reports having urinary urgency.  She states she has to quickly go to the bathroom and there are times where she does not get there in time.  She states there is also times where she just feels like she does not empty her bladder all the way and has to return.  She was treated with Bactrim within the hospital stay.  She is also overdue for Pap.  Patient also reports skin breakdown/pain on her tailbone.  She denies any fever, chills or drainage over this area.  She states when she was in the hospital they gave her a cream to put over this area. No results for input(s): HGB, HCT, WBC, PLT in the last 168 hours. CMP Latest Ref Rng & Units  10/14/2019 10/13/2019 10/12/2019  Glucose 70 - 99 mg/dL 92 83 96  BUN 6 - 20 mg/dL 9 18 15   Creatinine 0.44 - 1.00 mg/dL 0.48 0.50 0.55  Sodium 135 - 145 mmol/L 139 136 136  Potassium 3.5 - 5.1 mmol/L 3.7 4.1 3.8  Chloride 98 - 111 mmol/L 108 108 105  CO2 22 - 32 mmol/L 23 19(L) 24  Calcium 8.9 - 10.3 mg/dL 8.7(L) 8.2(L) 8.7(L)  Total Protein 6.5 - 8.1 g/dL 6.1(L) 5.7(L) 6.4(L)  Total Bilirubin 0.3 - 1.2 mg/dL 0.7 0.4 0.4  Alkaline Phos 38 - 126 U/L 47 45 57  AST 15 - 41 U/L 14(L) 14(L) 14(L)   ALT 0 - 44 U/L 12 11 10      Depression screen [facility] 2/9 11/22/2018 10/22/2018 10/22/2018 10/19/2018  Decreased Interest 0 0 0 0  Down, Depressed, Hopeless 0 0 0 0  PHQ - 2 Score 0 0 0 0  Altered sleeping - 0 - -  Tired, decreased energy - 1 - -  Change in appetite - 0 - -  Feeling bad or failure about yourself  - 0 - -  Trouble concentrating - 0 - -  Moving slowly or fidgety/restless - 0 - -  Suicidal thoughts - 0 - -  PHQ-9 Score - 1 - -  Difficult doing work/chores - Not difficult at all - -  Some recent data might be hidden    Allergies  Allergen Reactions  . Lopressor [Metoprolol Tartrate] Swelling    Angioedema  . Allegra Allergy [Fexofenadine Hcl] Hives  . Dilaudid [Hydromorphone]     Hallucinations  . Nsaids     On Xarelto  . Gabapentin (Once-Daily) Rash  . Lipitor [Atorvastatin] Diarrhea   Social History   Tobacco Use  . Smoking status: Former Smoker    Packs/day: 0.10    Years: 37.00    Pack years: 3.70    Types: Cigarettes    Quit date: 07/17/2018    Years since quitting: 1.2  . Smokeless tobacco: Never Used  Substance Use Topics  . Alcohol use: No   Past Medical History:  Diagnosis Date  . Angio-edema   . Arthritis   . CAD (coronary artery disease)    a. s/p NSTEMI in 10/2017 with cath showing LM disease --> s/p CABG on 11/23/2017 with LIMA-LAD and SVG-OM.   [name] Hay fever   . Ischemic cardiomyopathy    a. EF 35-40% by echo in 10/2017  . NSTEMI (non-ST elevated myocardial infarction) ([place])   . PAF (paroxysmal atrial fibrillation) ([place])    Diagnosed by ILR  . Pneumonia 11/16/2017  . Stroke [facility]) 08/23/2017   Left MCA infarct status post TPA and thrombectomy  . Urticaria    Past Surgical History:  Procedure Laterality Date  . CORONARY ARTERY BYPASS GRAFT N/A 11/23/2017   Procedure: CORONARY ARTERY BYPASS GRAFTING (CABG) x 2, ON PUMP, USING LEFT INTERNAL MAMMARY ARTERY AND RIGHT GREATER SAPHENOUS VEIN HARVESTED ENDOSCOPICALLY;  Surgeon: Gaye Pollack, MD;  Location: [place];  Service: Open Heart Surgery;  Laterality: N/A;  . CRYOABLATION     cervical  . IR ANGIO INTRA EXTRACRAN SEL COM CAROTID INNOMINATE UNI R MOD SED  08/21/2017  . IR ANGIO VERTEBRAL SEL SUBCLAVIAN INNOMINATE UNI R MOD SED  08/21/2017  . IR PERCUTANEOUS ART THROMBECTOMY/INFUSION INTRACRANIAL INC DIAG ANGIO  08/21/2017  . IR RADIOLOGIST EVAL & MGMT  10/30/2017  . LOOP RECORDER INSERTION N/A 08/28/2017   Procedure: LOOP RECORDER INSERTION;  Surgeon: Constance Haw, MD;  Location: [place] [facility];  Service: Cardiovascular;  Laterality: N/A;  . RADIOLOGY WITH ANESTHESIA N/A 08/21/2017   Procedure: RADIOLOGY WITH ANESTHESIA;  Surgeon: Luanne Bras, MD;  Location: [place];  Service: Radiology;  Laterality: N/A;  . RIGHT/LEFT HEART CATH AND CORONARY ANGIOGRAPHY N/A 11/20/2017   Procedure: RIGHT/LEFT HEART CATH AND CORONARY ANGIOGRAPHY;  Surgeon: Jettie Booze, MD;  Location: [place] [facility];  Service: Cardiovascular;  Laterality: N/A;  . TEE WITHOUT CARDIOVERSION N/A 08/28/2017   Procedure: TRANSESOPHAGEAL ECHOCARDIOGRAM (TEE);  Surgeon: Fay Records, MD;  Location: [place];  Service: Cardiovascular;  Laterality: N/A;  . TEE WITHOUT CARDIOVERSION N/A 11/23/2017   Procedure: TRANSESOPHAGEAL ECHOCARDIOGRAM (TEE);  Surgeon: Gaye Pollack, MD;  Location: [place];  Service: Open Heart Surgery;  Laterality: N/A;  . TUBAL LIGATION     Family History  Problem Relation Age of Onset  . Heart attack Father   . Hypertension Mother   . Hyperlipidemia Mother   . Diabetes type II Mother   . Urticaria Mother   . Hypertension Brother   . Breast cancer Cousin 47  . Immunodeficiency Neg Hx   . Eczema Neg Hx   . Atopy Neg Hx   . Asthma Neg Hx   . Angioedema Neg Hx   . Allergic rhinitis Neg Hx   . Colon cancer Neg Hx   . Esophageal cancer Neg Hx    Allergies as of 10/21/2019      Reactions   Lopressor [metoprolol Tartrate] Swelling   Angioedema   Allegra  Allergy [fexofenadine Hcl] Hives   Dilaudid [hydromorphone]    Hallucinations   Nsaids    On Xarelto   Gabapentin (once-daily) Rash   Lipitor [atorvastatin] Diarrhea      Medication List       Accurate as of October 21, 2019 11:59 PM. If you have any questions, ask your nurse or doctor.        acetaminophen 325 MG tablet Commonly known as: TYLENOL Take 2 tablets (650 mg total) by mouth every 6 (six) hours as needed for mild pain or moderate pain (or Fever >/= 101).   azelastine 0.1 % nasal spray Commonly known as: ASTELIN Place 1-2 sprays into both nostrils 2 (two) times daily as needed for rhinitis. Use in each nostril as directed   baclofen 10 MG tablet Commonly known as: LIORESAL TAKE 1 TABLET BY MOUTH THREE TIMES DAILY What changed: when to take this   calcium carbonate 500 MG chewable tablet Commonly known as: TUMS - dosed in mg elemental calcium Chew 1 tablet by mouth 2 (two) times daily.   carvedilol 3.125 MG tablet Commonly known as: COREG Take 1 tablet by mouth twice daily   cholecalciferol 1000 units tablet Commonly known as: VITAMIN D Take 1,000 Units by mouth daily.   Entresto 24-26 MG Generic drug: sacubitril-valsartan Take 1 tablet by mouth 2 (two) times daily.   famotidine 20 MG tablet Commonly known as: PEPCID Take 20 mg by mouth daily.   ferrous sulfate 325 (65 FE) MG tablet Take 325 mg by mouth 2 (two) times a week. Monday  and Thursday.   hydrOXYzine 10 MG tablet Commonly known as: ATARAX/VISTARIL Take 1 tablet (10 mg total) by mouth at bedtime. What changed:   when to take this  reasons to take this   levocetirizine 5 MG tablet Commonly known as: XYZAL Take 1 tablet (5 mg total) by mouth every evening.  mupirocin cream 2 % Commonly known as: Bactroban Apply 1 application topically 2 (two) times daily. Apply to area on buttocks. Started by: Howard Pouch, DO   ondansetron 4 MG disintegrating tablet Commonly known as: Zofran ODT  Take 1 tablet (4 mg total) by mouth every 8 (eight) hours as needed for nausea or vomiting.   pravastatin 20 MG tablet Commonly known as: Pravachol Take 1 tablet (20 mg total) by mouth daily.   rivaroxaban 20 MG Tabs tablet Commonly known as: Xarelto Take 1 tablet daily with evening meal   traMADol 50 MG tablet Commonly known as: ULTRAM Take 50 mg by mouth every 6 (six) hours as needed for moderate pain.   triamcinolone cream 0.1 % Commonly known as: KENALOG Apply 1 application topically 2 (two) times daily. What changed:   when to take this  reasons to take this   triamcinolone cream 0.1 % Commonly known as: KENALOG Apply 1 application topically 2 (two) times daily. What changed: You were already taking a medication with the same name, and this prescription was added. Make sure you understand how and when to take each. Changed by: Howard Pouch, DO   vitamin B-12 100 MCG tablet Commonly known as: CYANOCOBALAMIN Take 100 mcg by mouth daily.       All past medical history, surgical history, allergies, family history, immunizations and medications were updated in the EMR today and reviewed under the history and medication portions of their EMR.      ROS: Negative, with the exception of above mentioned in HPI   Objective:  BP 108/73 (BP Location: Left Arm, Patient Position: Sitting, Cuff Size: Normal)   Pulse 85   Temp 98.2 F (36.8 C) (Temporal)   Resp 16   Ht 5\' 1"  (1.549 m)   Wt 118 lb 4 oz (53.6 kg)   LMP  (LMP Unknown)   SpO2 97%   BMI 22.34 kg/m  Body mass index is 22.34 kg/m. Gen: Afebrile. No acute distress. Nontoxic in appearance, well developed, well nourished.  HENT: AT. [place]. Bilateral TM visualized WNL- dry flaky EAM opening. . MMM, no oral lesions.  Eyes:Pupils Equal Round Reactive to light, Extraocular movements intact,  Conjunctiva without redness, discharge or icterus. Neck/lymp/endocrine: Supple, no lymphadenopathy, no thyromegaly CV: RRR   Chest: CTAB, no wheeze or crackles. Good air movement, normal resp effort.  Abd: Soft. NTND. BS present Skin: Sacral area without erythema, small opening/tract, no drainage. No mass. No TTP.  Neuro: ambulates well with cane. PERLA. EOMi. Alert. Oriented x3  Psych: Normal affect, dress and demeanor. Normal speech. Normal thought content and judgment.  Assessment/Plan: ODELL BROCCOLI is a 59 y.o. female present for OV for Hospital discharge follow up Chronic pancreatitis, unspecified pancreatitis type [facility]) Patient reports she is doing well since her discharge from the hospital. She is slowly starting to get back to eating a routine diet. She has follow-up scheduled February 15 with her gastroenterologist to further evaluate possible cause of her repeat pancreatitis. She has had follow-up with her cardiologist.  She is having trouble affording the [facility].  She was encouraged to call her cardiologist to see if they have other options or  discount program/card for her. Tolerating statin and Coreg.  BPs are stable. -No causes found up to this point for her chronic pancreatitis.  Autoimmune pancreatitis have been ruled out.  Patient is on Lasix which could be affecting her pancreatitis.  Thyroid function study abnormality/Urticaria/Hashimoto's thyroiditis -Patient has been struggling with urticaria for  greater than a year.  Her thyroid panel was have been normal over this time.  She was sent to allergy specialist to rule out other causes and medications.  It was found that she had elevated thyroid antibodies.  We will repeat her thyroid panel today to ensure normal thyroid panel.  If able we will start levothyroxine replacement. If thyroid panel normal will refer to endocrinology. -Continue daily antihistamine - TSH - T4, free - T3, free  Urinary frequency/urinary incontinence Discussed options with her today.  Her urinary incontinence seems to be a possible mixed incontinence.  However we  will rule out abnormality of urine with microanalysis and reflex today. -She is agreeable to referral to gynecology to further evaluate.  She is also overdue for her Pap. - Urinalysis w microscopic + reflex culture  Irritation of ear, bilateral -Mild swelling and dry flakiness-likely secondary to her urticaria condition. Kenalog cream prescribed.  Patient instructed to place a small amount of cream on a Q-tip and apply over opening of EAM.  Pilonidal cyst/sinus Area of discomfort for her appears to be a small pilonidal sinus.  Currently not infected and only evidence is a small sinus opening. Patient was provided with instructions on pilonidal cyst formation, emergent care and treatment. She elected to monitor area closely.   Reviewed expectations re: course of current medical issues.  Discussed self-management of symptoms.  Outlined signs and symptoms indicating need for more acute intervention.  Patient verbalized understanding and all questions were answered.  Patient received an After-Visit Summary.  Any changes in medications were reviewed and patient was provided with updated med list with their AVS.      Orders Placed This Encounter  Procedures  . TSH  . T4, free  . T3, free  . Urinalysis w microscopic + reflex cultur  . REFLEXIVE URINE CULTURE  . Ambulatory referral to Gynecology  . Ambulatory referral to Endocrinology   Meds ordered this encounter  Medications  . triamcinolone cream (KENALOG) 0.1 %    Sig: Apply 1 application topically 2 (two) times daily.    Dispense:  45 g    Refill:  3  . mupirocin cream (BACTROBAN) 2 %    Sig: Apply 1 application topically 2 (two) times daily. Apply to area on buttocks.    Dispense:  15 g    Refill:  0    Referral Orders     Ambulatory referral to Gynecology     Ambulatory referral to Endocrinology    Note is dictated utilizing voice recognition software. Although note has been proof read prior to signing,  occasional typographical errors still can be missed. If any questions arise, please do not hesitate to call for verification.   electronically signed by:  Howard Pouch, DO  [place]

## 2019-10-21 NOTE — Telephone Encounter (Signed)
Pharmacy was called and message was left approving ointment for patient if that is what is covered under her insurance

## 2019-10-21 NOTE — Patient Instructions (Addendum)
Use the kenalog  cream on your ears.  We will collect your labs today on your thyroid and we may end up starting you on thyroid medication> which may help with the hives.   Bactroban cream/ointment for you buttock.  I have referred you to gynecology for your PAP and discuss your urinary incontinence.     Pilonidal Cyst  A pilonidal cyst is a fluid-filled sac that forms beneath the skin near the tailbone, at the top of the crease of the buttocks (pilonidal area). If the cyst is not large and not infected, it may not cause any problems. If the cyst becomes irritated or infected, it may get larger and fill with pus. An infected cyst is called an abscess. A pilonidal abscess may cause pain and swelling, and it may need to be drained or removed. What are the causes? The cause of this condition is not always known. In some cases, a hair that grows into your skin (ingrown hair) may be the cause. What increases the risk? You are more likely to get a pilonidal cyst if you:  Are female.  Have lots of hair near the crease of the buttocks.  Are overweight.  Have a dimple near the crease of the buttocks.  Wear tight clothing.  Do not bathe or shower often.  Sit for long periods of time. What are the signs or symptoms? Signs and symptoms of a pilonidal cyst may include pain, swelling, redness, and warmth in the pilonidal area. Depending on how big the cyst is, you may be able to feel a lump near your tailbone. If your cyst becomes infected, symptoms may include:  Pus or fluid drainage.  Fever.  Pain, swelling, and redness getting worse.  The lump getting bigger. How is this diagnosed? This condition may be diagnosed based on:  Your symptoms and medical history.  A physical exam.  A blood test to check for infection.  Testing a pus sample, if applicable. How is this treated? If your cyst does not cause symptoms, you may not need any treatment. If your cyst bothers you or is  infected, you may need a procedure to drain or remove the cyst. Depending on the size, location, and severity of your cyst, your health care provider may:  Make an incision in the cyst and drain it (incision and drainage).  Open and drain the cyst, and then stitch the wound so that it stays open while it heals (marsupialization). You will be given instructions about how to care for your open wound while it heals.  Remove all or part of the cyst, and then close the wound (cyst removal). You may need to take antibiotic medicines before your procedure. Follow these instructions at home: Medicines  Take over-the-counter and prescription medicines only as told by your health care provider.  If you were prescribed an antibiotic medicine, take it as told by your health care provider. Do not stop taking the antibiotic even if you start to feel better. General instructions  Keep the area around your pilonidal cyst clean and dry.  If there is fluid or pus draining from your cyst: ? Cover the area with a clean bandage (dressing) as needed. ? Wash the area gently with soap and water. Pat the area dry with a clean towel. Do not rub the area because that may cause bleeding.  Remove hair from the area around the cyst only if your health care provider tells you to do this.  Do not wear tight  pants or sit in one position for long periods at a time.  Keep all follow-up visits as told by your health care provider. This is important. Contact a health care provider if you have:  New redness, swelling, or pain.  A fever.  Severe pain. Summary  A pilonidal cyst is a fluid-filled sac that forms beneath the skin near the tailbone, at the top of the crease of the buttocks (pilonidal area).  If the cyst becomes irritated or infected, it may get larger and fill with pus. An infected cyst is called an abscess.  The cause of this condition is not always known. In some cases, a hair that grows into your  skin (ingrown hair) may be the cause.  If your cyst does not cause symptoms, you may not need any treatment. If your cyst bothers you or is infected, you may need a procedure to drain or remove the cyst. This information is not intended to replace advice given to you by your health care provider. Make sure you discuss any questions you have with your health care provider. Document Revised: 08/31/2017 Document Reviewed: 08/31/2017 Elsevier Patient Education  [place].    Urinary Incontinence  Urinary incontinence refers to a condition in which a person is unable to control where and when to pass urine. A person with this condition will urinate when he or she does not mean to (involuntarily). What are the causes? This condition may be caused by:  Medicines.  Infections.  Constipation.  Overactive bladder muscles.  Weak bladder muscles.  Weak pelvic floor muscles. These muscles provide support for the bladder, intestine, and, in women, the uterus.  Enlarged prostate in men. The prostate is a gland near the bladder. When it gets too big, it can pinch the urethra. With the urethra blocked, the bladder can weaken and lose the ability to empty properly.  Surgery.  Emotional factors, such as anxiety, stress, or post-traumatic stress disorder (PTSD).  Pelvic organ prolapse. This happens in women when organs shift out of place and into the vagina. This shift can prevent the bladder and urethra from working properly. What increases the risk? The following factors may make you more likely to develop this condition:  Older age.  Obesity and physical inactivity.  Pregnancy and childbirth.  Menopause.  Diseases that affect the nerves or spinal cord (neurological diseases).  Long-term (chronic) coughing. This can increase pressure on the bladder and pelvic floor muscles. What are the signs or symptoms? Symptoms may vary depending on the type of urinary incontinence you  have. They include:  A sudden urge to urinate, but passing urine involuntarily before you can get to a bathroom (urge incontinence).  Suddenly passing urine with any activity that forces urine to pass, such as coughing, laughing, exercise, or sneezing (stress incontinence).  Needing to urinate often, but urinating only a small amount, or constantly dribbling urine (overflow incontinence).  Urinating because you cannot get to the bathroom in time due to a physical disability, such as arthritis or injury, or communication and thinking problems, such as Alzheimer disease (functional incontinence). How is this diagnosed? This condition may be diagnosed based on:  Your medical history.  A physical exam.  Tests, such as: ? Urine tests. ? X-rays of your kidney and bladder. ? Ultrasound. ? CT scan. ? Cystoscopy. In this procedure, a health care provider inserts a tube with a light and camera (cystoscope) through the urethra and into the bladder in order to check for problems. ?  Urodynamic testing. These tests assess how well the bladder, urethra, and sphincter can store and release urine. There are different types of urodynamic tests, and they vary depending on what the test is measuring. To help diagnose your condition, your health care provider may recommend that you keep a log of when you urinate and how much you urinate. How is this treated? Treatment for this condition depends on the type of incontinence that you have and its cause. Treatment may include:  Lifestyle changes, such as: ? Quitting smoking. ? Maintaining a healthy weight. ? Staying active. Try to get 150 minutes of moderate-intensity exercise every week. Ask your health care provider which activities are safe for you. ? Eating a healthy diet.  Avoid high-fat foods, like fried foods.  Avoid refined carbohydrates like white bread and white rice.  Limit how much alcohol and caffeine you drink.  Increase your fiber  intake. Foods such as fresh fruits, vegetables, beans, and whole grains are healthy sources of fiber.  Pelvic floor muscle exercises.  Bladder training, such as lengthening the amount of time between bathroom breaks, or using the bathroom at regular intervals.  Using techniques to suppress bladder urges. This can include distraction techniques or controlled breathing exercises.  Medicines to relax the bladder muscles and prevent bladder spasms.  Medicines to help slow or prevent the growth of a man's prostate.  Botox injections. These can help relax the bladder muscles.  Using pulses of electricity to help change bladder reflexes (electrical nerve stimulation).  For women, using a medical device to prevent urine leaks. This is a small, tampon-like, disposable device that is inserted into the urethra.  Injecting collagen or carbon beads (bulking agents) into the urinary sphincter. These can help thicken tissue and close the bladder opening.  Surgery. Follow these instructions at home: Lifestyle  Limit alcohol and caffeine. These can fill your bladder quickly and irritate it.  Keep yourself clean to help prevent odors and skin damage. Ask your doctor about special skin creams and cleansers that can protect the skin from urine.  Consider wearing pads or adult diapers. Make sure to change them regularly, and always change them right after experiencing incontinence. General instructions  Take over-the-counter and prescription medicines only as told by your health care provider.  Use the bathroom about every 3-4 hours, even if you do not feel the need to urinate. Try to empty your bladder completely every time. After urinating, wait a minute. Then try to urinate again.  Make sure you are in a relaxed position while urinating.  If your incontinence is caused by nerve problems, keep a log of the medicines you take and the times you go to the bathroom.  Keep all follow-up visits as told  by your health care provider. This is important. Contact a health care provider if:  You have pain that gets worse.  Your incontinence gets worse. Get help right away if:  You have a fever or chills.  You are unable to urinate.  You have redness in your groin area or down your legs. Summary  Urinary incontinence refers to a condition in which a person is unable to control where and when to pass urine.  This condition may be caused by medicines, infection, weak bladder muscles, weak pelvic floor muscles, enlargement of the prostate (in men), or surgery.  The following factors increase your risk for developing this condition: older age, obesity, pregnancy and childbirth, menopause, neurological diseases, and chronic coughing.  There are several  types of urinary incontinence. They include urge incontinence, stress incontinence, overflow incontinence, and functional incontinence.  This condition is usually treated first with lifestyle and behavioral changes, such as quitting smoking, eating a healthier diet, and doing regular pelvic floor exercises. Other treatment options include medicines, bulking agents, medical devices, electrical nerve stimulation, or surgery. This information is not intended to replace advice given to you by your health care provider. Make sure you discuss any questions you have with your health care provider. Document Revised: 09/22/2017 Document Reviewed: 12/22/2016 Elsevier Patient Education  [place].

## 2019-10-21 NOTE — Telephone Encounter (Signed)
[facility] called and stated pts insurance does not cover the cream but will cover the ointment. Asked for a call back to approve this medication.

## 2019-10-22 ENCOUNTER — Telehealth: Payer: Self-pay | Admitting: Family Medicine

## 2019-10-22 ENCOUNTER — Ambulatory Visit (INDEPENDENT_AMBULATORY_CARE_PROVIDER_SITE_OTHER): Payer: 59 | Admitting: Cardiology

## 2019-10-22 ENCOUNTER — Encounter: Payer: Self-pay | Admitting: Cardiology

## 2019-10-22 VITALS — BP 78/50 | HR 81 | Ht 61.0 in | Wt 118.5 lb

## 2019-10-22 DIAGNOSIS — I480 Paroxysmal atrial fibrillation: Secondary | ICD-10-CM

## 2019-10-22 DIAGNOSIS — E063 Autoimmune thyroiditis: Secondary | ICD-10-CM | POA: Insufficient documentation

## 2019-10-22 HISTORY — DX: Autoimmune thyroiditis: E06.3

## 2019-10-22 LAB — URINALYSIS W MICROSCOPIC + REFLEX CULTURE
Bacteria, UA: NONE SEEN /HPF
Bilirubin Urine: NEGATIVE
Glucose, UA: NEGATIVE
Hgb urine dipstick: NEGATIVE
Hyaline Cast: NONE SEEN /LPF
Ketones, ur: NEGATIVE
Leukocyte Esterase: NEGATIVE
Nitrites, Initial: NEGATIVE
Protein, ur: NEGATIVE
RBC / HPF: NONE SEEN /HPF (ref 0–2)
Specific Gravity, Urine: 1.006 (ref 1.001–1.03)
Squamous Epithelial / HPF: NONE SEEN /HPF (ref <=5)
WBC, UA: NONE SEEN /HPF (ref 0–5)
pH: 6.5 (ref 5.0–8.0)

## 2019-10-22 LAB — NO CULTURE INDICATED

## 2019-10-22 NOTE — Patient Instructions (Signed)
Medication Instructions:  Your physician recommends that you continue on your current medications as directed. Please refer to the Current Medication list given to you today.  Labwork: None ordered.  Testing/Procedures: None ordered.  Follow-Up: Your physician wants you to follow-up in: one year with Dr Cora Daniels will receive a reminder letter in the mail two months in advance. If you don't receive a letter, please call our office to schedule the follow-up appointment.  Remote monitoring is used to monitor your  Linq from home. This monitoring reduces the number of office visits required to check your device to one time per year. It allows [place] to keep an eye on the functioning of your device to ensure it is working properly.Any Other Special Instructions Will Be Listed Below (If Applicable)  .  If you need a refill on your cardiac medications before your next appointment, please call your pharmacy.

## 2019-10-22 NOTE — Telephone Encounter (Signed)
Pt was called and given information. She verbalized understanding.

## 2019-10-22 NOTE — Telephone Encounter (Signed)
Please inform patient the following information: Her urine is normal. Her thyroid test are normal. Since her thyroid levels are in normal range, but her thyroid antibodies are high and she is having hives, I would recommend referral to an endocrinologist to see if they treatment options since her hives seem to be coming from Hashimoto's thyroiditis (which is the name of her autoimmune thyroid condition).  I have placed that referral for her.

## 2019-10-22 NOTE — Progress Notes (Signed)
Electrophysiology Office Note   Date:  10/22/2019   ID:  [name], DOB Dec 31, 1960, MRN [id number]  PCP:  Natalia Leatherwood, DO  Cardiologist:  Diona Browner Primary Electrophysiologist:  Suzan Manon Jorja Loa, MD    No chief complaint on file.    History of Present Illness: [name] is a 59 y.o. female who is being seen today for the evaluation of atrial fibrillation at the request of Nona Dell. Presenting today for electrophysiology evaluation.  She has a past history of CVA and tobacco abuse.  She was admitted [facility] 11/16/17 for worsening dyspnea on exertion was found to have a non-STEMI with a peak troponin of 10.5 and was transferred to [facility].  Echo showed an EF of 35-40% with diffuse hypokinesis and akinesis of the basal mid inferior lateral, inferior inferoseptal wall.  Cath showed severe left main disease, circumflex stenosis, ostial LAD and proximal RCA stenosis.  She underwent CABG 11/23/17 with a LIMA to the LAD and vein to the OM.  Today, denies symptoms of palpitations, chest pain, shortness of breath, orthopnea, PND, lower extremity edema, claudication, dizziness, presyncope, syncope, bleeding, or neurologic sequela. The patient is tolerating medications without difficulties. Overall she is doing well. She has no chest pain or shortness of breath. She is able to do all her daily activities. She remains on both Entresto and Eliquis, though she is now having some difficulty affording them due to a change in insurance.   Past Medical History:  Diagnosis Date  . Angio-edema   . Arthritis   . CAD (coronary artery disease)    a. s/p NSTEMI in 10/2017 with cath showing LM disease --> s/p CABG on 11/23/2017 with LIMA-LAD and SVG-OM.   [name] Hay fever   . Ischemic cardiomyopathy    a. EF 35-40% by echo in 10/2017  . NSTEMI (non-ST elevated myocardial infarction) (HCC)   . PAF (paroxysmal atrial fibrillation) (HCC)    Diagnosed by ILR  . Pneumonia 11/16/2017  .  Stroke [facility]) 08/23/2017   Left MCA infarct status post TPA and thrombectomy  . Urticaria    Past Surgical History:  Procedure Laterality Date  . CORONARY ARTERY BYPASS GRAFT N/A 11/23/2017   Procedure: CORONARY ARTERY BYPASS GRAFTING (CABG) x 2, ON PUMP, USING LEFT INTERNAL MAMMARY ARTERY AND RIGHT GREATER SAPHENOUS VEIN HARVESTED ENDOSCOPICALLY;  Surgeon: Alleen Borne, MD;  Location: [facility] OR;  Service: Open Heart Surgery;  Laterality: N/A;  . CRYOABLATION     cervical  . IR ANGIO INTRA EXTRACRAN SEL COM CAROTID INNOMINATE UNI R MOD SED  08/21/2017  . IR ANGIO VERTEBRAL SEL SUBCLAVIAN INNOMINATE UNI R MOD SED  08/21/2017  . IR PERCUTANEOUS ART THROMBECTOMY/INFUSION INTRACRANIAL INC DIAG ANGIO  08/21/2017  . IR RADIOLOGIST EVAL & MGMT  10/30/2017  . LOOP RECORDER INSERTION N/A 08/28/2017   Procedure: LOOP RECORDER INSERTION;  Surgeon: Regan Lemming, MD;  Location: [facility] INVASIVE CV LAB;  Service: Cardiovascular;  Laterality: N/A;  . RADIOLOGY WITH ANESTHESIA N/A 08/21/2017   Procedure: RADIOLOGY WITH ANESTHESIA;  Surgeon: Julieanne Cotton, MD;  Location: [facility] OR;  Service: Radiology;  Laterality: N/A;  . RIGHT/LEFT HEART CATH AND CORONARY ANGIOGRAPHY N/A 11/20/2017   Procedure: RIGHT/LEFT HEART CATH AND CORONARY ANGIOGRAPHY;  Surgeon: Corky Crafts, MD;  Location: [facility] INVASIVE CV LAB;  Service: Cardiovascular;  Laterality: N/A;  . TEE WITHOUT CARDIOVERSION N/A 08/28/2017   Procedure: TRANSESOPHAGEAL ECHOCARDIOGRAM (TEE);  Surgeon: Pricilla Riffle, MD;  Location: [facility] ENDOSCOPY;  Service: Cardiovascular;  Laterality: N/A;  . TEE WITHOUT CARDIOVERSION N/A 11/23/2017   Procedure: TRANSESOPHAGEAL ECHOCARDIOGRAM (TEE);  Surgeon: Alleen Borne, MD;  Location: [facility] OR;  Service: Open Heart Surgery;  Laterality: N/A;  . TUBAL LIGATION       Current Outpatient Medications  Medication Sig Dispense Refill  . acetaminophen (TYLENOL) 325 MG tablet Take 2 tablets (650 mg total) by mouth every 6 (six)  hours as needed for mild pain or moderate pain (or Fever >/= 101).    [name] azelastine (ASTELIN) 0.1 % nasal spray Place 1-2 sprays into both nostrils 2 (two) times daily as needed for rhinitis. Use in each nostril as directed 30 mL 5  . baclofen (LIORESAL) 10 mg/mL SUSP Take 10 mg by mouth 2 (two) times daily.    . calcium carbonate (TUMS - DOSED IN MG ELEMENTAL CALCIUM) 500 MG chewable tablet Chew 1 tablet by mouth 2 (two) times daily.     . carvedilol (COREG) 3.125 MG tablet Take 1 tablet by mouth twice daily 180 tablet 1  . cholecalciferol (VITAMIN D) 1000 units tablet Take 1,000 Units by mouth daily.    . famotidine (PEPCID) 20 MG tablet Take 20 mg by mouth daily.     . ferrous sulfate 325 (65 FE) MG tablet Take 325 mg by mouth 2 (two) times a week. Monday  and Thursday.    . hydrOXYzine (ATARAX/VISTARIL) 10 MG tablet Take 1 tablet (10 mg total) by mouth at bedtime. 90 tablet 1  . levocetirizine (XYZAL) 5 MG tablet Take 1 tablet (5 mg total) by mouth every evening. 60 tablet 5  . mupirocin cream (BACTROBAN) 2 % Apply 1 application topically 2 (two) times daily. Apply to area on buttocks. 15 g 0  . ondansetron (ZOFRAN ODT) 4 MG disintegrating tablet Take 1 tablet (4 mg total) by mouth every 8 (eight) hours as needed for nausea or vomiting. 30 tablet 0  . pravastatin (PRAVACHOL) 20 MG tablet Take 1 tablet (20 mg total) by mouth daily. 30 tablet 3  . rivaroxaban (XARELTO) 20 MG TABS tablet Take 1 tablet daily with evening meal 90 tablet 3  . sacubitril-valsartan (ENTRESTO) 24-26 MG Take 1 tablet by mouth 2 (two) times daily. 60 tablet   . traMADol (ULTRAM) 50 MG tablet Take 50 mg by mouth every 6 (six) hours as needed for moderate pain.    [name] triamcinolone cream (KENALOG) 0.1 % Apply 1 application topically 2 (two) times daily. 45 g 3  . vitamin B-12 (CYANOCOBALAMIN) 100 MCG tablet Take 100 mcg by mouth daily.     No current facility-administered medications for this visit.    Allergies:    Lopressor [metoprolol tartrate], Allegra allergy [fexofenadine hcl], Dilaudid [hydromorphone], Nsaids, Gabapentin (once-daily), and Lipitor [atorvastatin]   Social History:  The patient  reports that she quit smoking about 15 months ago. Her smoking use included cigarettes. She has a 3.70 pack-year smoking history. She has never used smokeless tobacco. She reports that she does not drink alcohol or use drugs.   Family History:  The patient's family history includes Breast cancer (age of onset: 10) in her cousin; Diabetes type II in her mother; Heart attack in her father; Hyperlipidemia in her mother; Hypertension in her brother and mother; Urticaria in her mother.    ROS:  Please see the history of present illness.   Otherwise, review of systems is positive for none.   All other systems are reviewed and negative.   PHYSICAL EXAM: VS:  BP (!) 78/50   Pulse 81   Ht 5\' 1"  (1.549 m)   Wt 118 lb 7.2 oz (53.7 kg)   LMP  (LMP Unknown)   SpO2 95%   BMI 22.38 kg/m  , BMI Body mass index is 22.38 kg/m. GEN: Well nourished, well developed, in no acute distress  HEENT: normal  Neck: no JVD, carotid bruits, or masses Cardiac: RRR; no murmurs, rubs, or gallops,no edema  Respiratory:  clear to auscultation bilaterally, normal work of breathing GI: soft, nontender, nondistended, + BS MS: no deformity or atrophy  Skin: warm and dry Neuro:  Strength and sensation are intact Psych: euthymic mood, full affect  EKG:  EKG is not ordered today. Personal review of the ekg ordered 09/10/19 shows SR, rate 101   Recent Labs: 10/14/2019: ALT 12; BUN 9; Creatinine, Ser 0.48; Hemoglobin 12.3; Platelets 376; Potassium 3.7; Sodium 139 10/21/2019: TSH 2.56    Lipid Panel     Component Value Date/Time   CHOL 203 (H) 09/11/2019 0453   TRIG 85 09/11/2019 0453   HDL 28 (L) 09/11/2019 0453   CHOLHDL 7.3 09/11/2019 0453   VLDL 17 09/11/2019 0453   LDLCALC 158 (H) 09/11/2019 0453     Wt Readings from Last 3  Encounters:  10/22/19 118 lb 7.2 oz (53.7 kg)  10/21/19 118 lb 4 oz (53.6 kg)  10/17/19 118 lb (53.5 kg)      Other studies Reviewed: Additional studies/ records that were reviewed today include: TTE 08/01/2019 Review of the above records today demonstrates:   1. Left ventricular ejection fraction, by visual estimation, is 50 to 55%. The left ventricle has low normal function. There is no left ventricular hypertrophy.  2. Left ventricular diastolic parameters are consistent with Grade I diastolic dysfunction (impaired relaxation).  3. Hypokinesis/ akinesis of the basal inferior/inferior walls.  4. Global right ventricle has normal systolic function.The right ventricular size is normal. No increase in right ventricular wall thickness.  5. Left atrial size was normal.  6. Right atrial size was normal.  7. The mitral valve is normal in structure. No evidence of mitral valve regurgitation.  8. The tricuspid valve is normal in structure. Tricuspid valve regurgitation is not demonstrated.  9. The aortic valve is tricuspid. Aortic valve regurgitation is not visualized. Mild to moderate aortic valve sclerosis/calcification without any evidence of aortic stenosis. 10. The pulmonic valve was not well visualized. Pulmonic valve regurgitation is not visualized.    ASSESSMENT AND PLAN:  1.  Coronary artery disease status post two-vessel bypass: No current chest pain.  No changes.  2.  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  No obvious volume overload.  No changes.  3.  Paroxysmal atrial fibrillation: Likely the cause of her CVA.  Currently on Xarelto.  CHA2DS2-VASc of 5. She is tolerating these medicines well. She is having some trouble affording them. We Kaisei Gilbo see if we get them more affordable and in the interim we Tonatiuh Mallon give her some samples.    Current medicines are reviewed at length with the patient today.   The patient does  not have concerns regarding her medicines.  The following changes were made today: None  Labs/ tests ordered today include:  No orders of the defined types were placed in this encounter.    Disposition:   FU with Tieara Flitton 12 months  Signed, Nyah Shepherd Jorja Loa, MD  10/22/2019 1:47 PM     [facility] [address] [place] [address] [phone] (office) [phone] (fax)

## 2019-10-28 ENCOUNTER — Ambulatory Visit: Payer: BC Managed Care – PPO | Admitting: Registered Nurse

## 2019-10-28 ENCOUNTER — Telehealth: Payer: Self-pay | Admitting: Cardiology

## 2019-10-28 NOTE — Telephone Encounter (Signed)
Please give pt a call -- she is needing assistance for her  rivaroxaban (XARELTO) 20 MG TABS tablet [id number] and   sacubitril-valsartan (ENTRESTO) 24-26 MG [id number]

## 2019-10-28 NOTE — Telephone Encounter (Signed)
Spoke with patient who states she will stop by office on tomorrow to complete patient assist forms.

## 2019-10-30 ENCOUNTER — Encounter: Payer: 59 | Attending: Physical Medicine & Rehabilitation | Admitting: Registered Nurse

## 2019-10-30 ENCOUNTER — Other Ambulatory Visit: Payer: Self-pay

## 2019-10-30 ENCOUNTER — Encounter: Payer: Self-pay | Admitting: Registered Nurse

## 2019-10-30 VITALS — BP 103/70 | HR 79 | Temp 97.9°F | Ht 61.0 in | Wt 116.0 lb

## 2019-10-30 DIAGNOSIS — I63512 Cerebral infarction due to unspecified occlusion or stenosis of left middle cerebral artery: Secondary | ICD-10-CM

## 2019-10-30 DIAGNOSIS — R269 Unspecified abnormalities of gait and mobility: Secondary | ICD-10-CM

## 2019-10-30 DIAGNOSIS — M170 Bilateral primary osteoarthritis of knee: Secondary | ICD-10-CM | POA: Insufficient documentation

## 2019-10-30 DIAGNOSIS — G8110 Spastic hemiplegia affecting unspecified side: Secondary | ICD-10-CM

## 2019-10-30 NOTE — Progress Notes (Addendum)
Subjective:    Patient ID: [name], female    DOB: 05/30/1961, 59 y.o.   MRN: [id number]  HPI : Mrs. Warshawsky presents for bilateral knee injections today. Last injections were in May and provided 3 months of relief. She did note have adverse reaction to the medication.   Pain Inventory Average Pain 4 Pain Right Now 4 My pain is constant, tingling and aching  In the last 24 hours, has pain interfered with the following? General activity 4 Relation with others 4 Enjoyment of life 4 What TIME of day is your pain at its worst? morning Sleep (in general) Fair  Pain is worse with: walking Pain improves with: rest, medication and injections Relief from Meds: 3  Mobility walk without assistance ability to climb steps?  yes do you drive?  yes  Function not employed: date last employed . I need assistance with the following:  meal prep and household duties  Neuro/Psych trouble walking  Prior Studies Any changes since last visit?  no  Physicians involved in your care Any changes since last visit?  no   Family History  Problem Relation Age of Onset  . Heart attack Father   . Hypertension Mother   . Hyperlipidemia Mother   . Diabetes type II Mother   . Urticaria Mother   . Hypertension Brother   . Breast cancer Cousin 49  . Immunodeficiency Neg Hx   . Eczema Neg Hx   . Atopy Neg Hx   . Asthma Neg Hx   . Angioedema Neg Hx   . Allergic rhinitis Neg Hx   . Colon cancer Neg Hx   . Esophageal cancer Neg Hx    Social History   Socioeconomic History  . Marital status: Married    Spouse name: Not on file  . Number of children: 2  . Years of education: Not on file  . Highest education level: Not on file  Occupational History  . Not on file  Tobacco Use  . Smoking status: Former Smoker    Packs/day: 0.10    Years: 37.00    Pack years: 3.70    Types: Cigarettes    Quit date: 07/17/2018    Years since quitting: 1.2  . Smokeless tobacco: Never Used    Substance and Sexual Activity  . Alcohol use: No  . Drug use: No  . Sexual activity: Yes    Partners: Male  Other Topics Concern  . Not on file  Social History Narrative   Married. 2 children.    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    Walks with cane or wheelchair.    Former smoker.    Drinks caffeine.    Smoke alarm in the home.   Firearms in the home.    Wears her seatbelt.    Feels safe In her relationships.    Social Determinants of Health   Financial Resource Strain:   . Difficulty of Paying Living Expenses: Not on file  Food Insecurity:   . Worried About Charity fundraiser in the Last Year: Not on file  . Ran Out of Food in the Last Year: Not on file  Transportation Needs:   . Lack of Transportation (Medical): Not on file  . Lack of Transportation (Non-Medical): Not on file  Physical Activity:   . Days of Exercise per Week: Not on file  . Minutes of Exercise per Session: Not on file  Stress:   . Feeling of Stress : Not on file  Social Connections:   . Frequency of Communication with Friends and Family: Not on file  . Frequency of Social Gatherings with Friends and Family: Not on file  . Attends Religious Services: Not on file  . Active Member of Clubs or Organizations: Not on file  . Attends Archivist Meetings: Not on file  . Marital Status: Not on file   Past Surgical History:  Procedure Laterality Date  . CORONARY ARTERY BYPASS GRAFT N/A 11/23/2017   Procedure: CORONARY ARTERY BYPASS GRAFTING (CABG) x 2, ON PUMP, USING LEFT INTERNAL MAMMARY ARTERY AND RIGHT GREATER SAPHENOUS VEIN HARVESTED ENDOSCOPICALLY;  Surgeon: Gaye Pollack, MD;  Location: [facility];  Service: Open Heart Surgery;  Laterality: N/A;  . CRYOABLATION     cervical  . IR ANGIO INTRA EXTRACRAN SEL COM CAROTID INNOMINATE UNI R MOD SED  08/21/2017  . IR ANGIO VERTEBRAL SEL SUBCLAVIAN INNOMINATE UNI R MOD SED  08/21/2017  . IR PERCUTANEOUS ART THROMBECTOMY/INFUSION INTRACRANIAL INC DIAG  ANGIO  08/21/2017  . IR RADIOLOGIST EVAL & MGMT  10/30/2017  . LOOP RECORDER INSERTION N/A 08/28/2017   Procedure: LOOP RECORDER INSERTION;  Surgeon: Constance Haw, MD;  Location: [facility];  Service: Cardiovascular;  Laterality: N/A;  . RADIOLOGY WITH ANESTHESIA N/A 08/21/2017   Procedure: RADIOLOGY WITH ANESTHESIA;  Surgeon: Luanne Bras, MD;  Location: [facility];  Service: Radiology;  Laterality: N/A;  . RIGHT/LEFT HEART CATH AND CORONARY ANGIOGRAPHY N/A 11/20/2017   Procedure: RIGHT/LEFT HEART CATH AND CORONARY ANGIOGRAPHY;  Surgeon: Jettie Booze, MD;  Location: [facility];  Service: Cardiovascular;  Laterality: N/A;  . TEE WITHOUT CARDIOVERSION N/A 08/28/2017   Procedure: TRANSESOPHAGEAL ECHOCARDIOGRAM (TEE);  Surgeon: Fay Records, MD;  Location: [facility];  Service: Cardiovascular;  Laterality: N/A;  . TEE WITHOUT CARDIOVERSION N/A 11/23/2017   Procedure: TRANSESOPHAGEAL ECHOCARDIOGRAM (TEE);  Surgeon: Gaye Pollack, MD;  Location: [facility];  Service: Open Heart Surgery;  Laterality: N/A;  . TUBAL LIGATION     Past Medical History:  Diagnosis Date  . Angio-edema   . Arthritis   . CAD (coronary artery disease)    a. s/p NSTEMI in 10/2017 with cath showing LM disease --> s/p CABG on 11/23/2017 with LIMA-LAD and SVG-OM.   [name] Hay fever   . Ischemic cardiomyopathy    a. EF 35-40% by echo in 10/2017  . NSTEMI (non-ST elevated myocardial infarction) ([place])   . PAF (paroxysmal atrial fibrillation) ([place])    Diagnosed by ILR  . Pneumonia 11/16/2017  . Stroke [facility]) 08/23/2017   Left MCA infarct status post TPA and thrombectomy  . Urticaria    BP 103/70   Pulse 79   Temp 97.9 F (36.6 C)   Ht 5\' 1"  (1.549 m)   Wt 116 lb (52.6 kg)   LMP  (LMP Unknown)   SpO2 93%   BMI 21.92 kg/m   Opioid Risk Score:   Fall Risk Score:  `1  Depression screen PHQ 2/9  Depression screen [facility] 2/9 11/22/2018 10/22/2018 10/22/2018 10/19/2018  Decreased Interest 0 0 0 0    Down, Depressed, Hopeless 0 0 0 0  PHQ - 2 Score 0 0 0 0  Altered sleeping - 0 - -  Tired, decreased energy - 1 - -  Change in appetite - 0 - -  Feeling bad or failure about yourself  - 0 - -  Trouble concentrating - 0 - -  Moving slowly or fidgety/restless - 0 - -  Suicidal thoughts - 0 - -  PHQ-9 Score - 1 - -  Difficult doing work/chores - Not difficult at all - -  Some recent data might be hidden    Review of Systems  Constitutional: Negative.   HENT: Negative.   Eyes: Negative.   Respiratory: Negative.   Gastrointestinal: Negative.   Endocrine: Negative.   Genitourinary: Negative.   Musculoskeletal: Positive for arthralgias and gait problem.  Skin: Negative.   Allergic/Immunologic: Negative.   Hematological: Negative.   Psychiatric/Behavioral: Negative.   All other systems reviewed and are negative.      Objective:  Gen: no distress, normal appearing HEENT: oral mucosa pink and moist, NCAT Cardio: Reg rate Chest: normal effort, normal rate of breathing Abd: soft, non-distended Ext: no edema Skin: intact Neuro: AOx3.  Musculoskeletal: Pain on medial aspect of bilateral knees.  Psych: pleasant, normal affect        Assessment & Plan:  After consent was obtained, using sterile technique, bilateral knees were prepped and knee joint was entered through the medial infrapatellar approach and 1 mL of  Kenalog and 3 ml plain Lidocaine was then injected and the needle withdrawn.  The procedure was well tolerated.  The patient is asked to continue to rest the knee for a few more days before resuming regular activities.  It may be more painful for the first 1-2 days.  Watch for fever, or increased swelling or persistent pain in knee. Call or return to clinic prn if such symptoms occur or the knee fails to improve as anticipated.

## 2019-11-04 ENCOUNTER — Telehealth: Payer: Self-pay | Admitting: Cardiology

## 2019-11-04 NOTE — Telephone Encounter (Signed)
ENTRESTO

## 2019-11-05 NOTE — Telephone Encounter (Signed)
Pt requesting samples of entresto 24-26 mg

## 2019-11-06 ENCOUNTER — Other Ambulatory Visit: Payer: Self-pay

## 2019-11-06 ENCOUNTER — Emergency Department (HOSPITAL_COMMUNITY): Payer: 59

## 2019-11-06 ENCOUNTER — Encounter (HOSPITAL_COMMUNITY): Payer: Self-pay | Admitting: Emergency Medicine

## 2019-11-06 ENCOUNTER — Inpatient Hospital Stay (HOSPITAL_COMMUNITY)
Admission: EM | Admit: 2019-11-06 | Discharge: 2019-11-09 | DRG: 439 | Disposition: A | Discharge status: Home / Self Care | Payer: 59 | Attending: Internal Medicine | Admitting: Internal Medicine

## 2019-11-06 DIAGNOSIS — Z888 Allergy status to other drugs, medicaments and biological substances status: Secondary | ICD-10-CM

## 2019-11-06 DIAGNOSIS — K8050 Calculus of bile duct without cholangitis or cholecystitis without obstruction: Secondary | ICD-10-CM | POA: Diagnosis present

## 2019-11-06 DIAGNOSIS — K862 Cyst of pancreas: Secondary | ICD-10-CM | POA: Diagnosis present

## 2019-11-06 DIAGNOSIS — Z951 Presence of aortocoronary bypass graft: Secondary | ICD-10-CM

## 2019-11-06 DIAGNOSIS — R1084 Generalized abdominal pain: Secondary | ICD-10-CM | POA: Diagnosis not present

## 2019-11-06 DIAGNOSIS — I110 Hypertensive heart disease with heart failure: Secondary | ICD-10-CM | POA: Diagnosis present

## 2019-11-06 DIAGNOSIS — E785 Hyperlipidemia, unspecified: Secondary | ICD-10-CM | POA: Diagnosis present

## 2019-11-06 DIAGNOSIS — Z8249 Family history of ischemic heart disease and other diseases of the circulatory system: Secondary | ICD-10-CM

## 2019-11-06 DIAGNOSIS — I252 Old myocardial infarction: Secondary | ICD-10-CM

## 2019-11-06 DIAGNOSIS — Z886 Allergy status to analgesic agent status: Secondary | ICD-10-CM

## 2019-11-06 DIAGNOSIS — K861 Other chronic pancreatitis: Secondary | ICD-10-CM | POA: Diagnosis present

## 2019-11-06 DIAGNOSIS — I2510 Atherosclerotic heart disease of native coronary artery without angina pectoris: Secondary | ICD-10-CM | POA: Diagnosis present

## 2019-11-06 DIAGNOSIS — K859 Acute pancreatitis without necrosis or infection, unspecified: Principal | ICD-10-CM | POA: Diagnosis present

## 2019-11-06 DIAGNOSIS — I513 Intracardiac thrombosis, not elsewhere classified: Secondary | ICD-10-CM | POA: Diagnosis present

## 2019-11-06 DIAGNOSIS — Z8673 Personal history of transient ischemic attack (TIA), and cerebral infarction without residual deficits: Secondary | ICD-10-CM

## 2019-11-06 DIAGNOSIS — I25810 Atherosclerosis of coronary artery bypass graft(s) without angina pectoris: Secondary | ICD-10-CM | POA: Diagnosis present

## 2019-11-06 DIAGNOSIS — I5032 Chronic diastolic (congestive) heart failure: Secondary | ICD-10-CM | POA: Diagnosis present

## 2019-11-06 DIAGNOSIS — I255 Ischemic cardiomyopathy: Secondary | ICD-10-CM | POA: Diagnosis present

## 2019-11-06 DIAGNOSIS — I714 Abdominal aortic aneurysm, without rupture: Secondary | ICD-10-CM | POA: Diagnosis present

## 2019-11-06 DIAGNOSIS — Z79899 Other long term (current) drug therapy: Secondary | ICD-10-CM

## 2019-11-06 DIAGNOSIS — Z8701 Personal history of pneumonia (recurrent): Secondary | ICD-10-CM

## 2019-11-06 DIAGNOSIS — Z885 Allergy status to narcotic agent status: Secondary | ICD-10-CM

## 2019-11-06 DIAGNOSIS — Z8349 Family history of other endocrine, nutritional and metabolic diseases: Secondary | ICD-10-CM

## 2019-11-06 DIAGNOSIS — Z20822 Contact with and (suspected) exposure to covid-19: Secondary | ICD-10-CM | POA: Diagnosis present

## 2019-11-06 DIAGNOSIS — I480 Paroxysmal atrial fibrillation: Secondary | ICD-10-CM | POA: Diagnosis present

## 2019-11-06 DIAGNOSIS — M1990 Unspecified osteoarthritis, unspecified site: Secondary | ICD-10-CM | POA: Diagnosis present

## 2019-11-06 DIAGNOSIS — K863 Pseudocyst of pancreas: Secondary | ICD-10-CM | POA: Diagnosis present

## 2019-11-06 DIAGNOSIS — F17210 Nicotine dependence, cigarettes, uncomplicated: Secondary | ICD-10-CM | POA: Diagnosis present

## 2019-11-06 DIAGNOSIS — I63512 Cerebral infarction due to unspecified occlusion or stenosis of left middle cerebral artery: Secondary | ICD-10-CM | POA: Diagnosis present

## 2019-11-06 DIAGNOSIS — Z7901 Long term (current) use of anticoagulants: Secondary | ICD-10-CM

## 2019-11-06 DIAGNOSIS — I10 Essential (primary) hypertension: Secondary | ICD-10-CM | POA: Diagnosis present

## 2019-11-06 LAB — COMPREHENSIVE METABOLIC PANEL
ALT: 20 U/L (ref 0–44)
AST: 23 U/L (ref 15–41)
Albumin: 4.5 g/dL (ref 3.5–5.0)
Alkaline Phosphatase: 65 U/L (ref 38–126)
Anion gap: 10 (ref 5–15)
BUN: 16 mg/dL (ref 6–20)
CO2: 31 mmol/L (ref 22–32)
Calcium: 9.7 mg/dL (ref 8.9–10.3)
Chloride: 100 mmol/L (ref 98–111)
Creatinine, Ser: 0.9 mg/dL (ref 0.44–1.00)
GFR calc Af Amer: >60 mL/min (ref >60)
GFR calc non Af Amer: >60 mL/min (ref >60)
Glucose, Bld: 146 mg/dL — ABNORMAL HIGH (ref 70–99)
Potassium: 3.5 mmol/L (ref 3.5–5.1)
Sodium: 141 mmol/L (ref 135–145)
Total Bilirubin: 0.7 mg/dL (ref 0.3–1.2)
Total Protein: 8.3 g/dL — ABNORMAL HIGH (ref 6.5–8.1)

## 2019-11-06 LAB — CBC
HCT: 53.9 % — ABNORMAL HIGH (ref 36.0–46.0)
Hemoglobin: 17.5 g/dL — ABNORMAL HIGH (ref 12.0–15.0)
MCH: 32.6 pg (ref 26.0–34.0)
MCHC: 32.5 g/dL (ref 30.0–36.0)
MCV: 100.6 fL — ABNORMAL HIGH (ref 80.0–100.0)
Platelets: 455 10*3/uL — ABNORMAL HIGH (ref 150–400)
RBC: 5.36 MIL/uL — ABNORMAL HIGH (ref 3.87–5.11)
RDW: 14.3 % (ref 11.5–15.5)
WBC: 17.5 10*3/uL — ABNORMAL HIGH (ref 4.0–10.5)
nRBC: 0 % (ref 0.0–0.2)

## 2019-11-06 MED ORDER — SODIUM CHLORIDE (PF) 0.9 % IJ SOLN
INTRAMUSCULAR | Status: AC
  Filled 2019-11-06: qty 50

## 2019-11-06 MED ORDER — MORPHINE SULFATE (PF) 4 MG/ML IV SOLN
6.0000 mg | Freq: Once | INTRAVENOUS | Status: AC
  Administered 2019-11-06: 6 mg via INTRAVENOUS
  Filled 2019-11-06: qty 2

## 2019-11-06 MED ORDER — ONDANSETRON 4 MG PO TBDP: 4.0000 mg | ORAL_TABLET | Freq: Once | ORAL | Status: DC | PRN

## 2019-11-06 MED ORDER — SODIUM CHLORIDE 0.9% FLUSH
3.0000 mL | Freq: Once | INTRAVENOUS | Status: AC
  Administered 2019-11-06: 3 mL via INTRAVENOUS

## 2019-11-06 MED ORDER — SODIUM CHLORIDE 0.9 % IV BOLUS
1000.0000 mL | Freq: Once | INTRAVENOUS | Status: AC
  Administered 2019-11-06: 1000 mL via INTRAVENOUS

## 2019-11-06 MED ORDER — IOHEXOL 300 MG/ML  SOLN
100.0000 mL | Freq: Once | INTRAMUSCULAR | Status: AC | PRN
  Administered 2019-11-07: 80 mL via INTRAVENOUS

## 2019-11-06 MED ORDER — ONDANSETRON HCL 4 MG/2ML IJ SOLN
4.0000 mg | Freq: Once | INTRAMUSCULAR | Status: AC
  Administered 2019-11-06: 4 mg via INTRAVENOUS
  Filled 2019-11-06: qty 2

## 2019-11-06 MED ORDER — HYDROMORPHONE HCL 1 MG/ML IJ SOLN: 1.0000 mg | Freq: Once | INTRAMUSCULAR | Status: DC

## 2019-11-06 NOTE — ED Triage Notes (Signed)
Patient states she is having abdominal pain that started around 5pm. Patient has a hx of pancreatitis. Patient also is having nausea and vomiting. Had to get rectal temp due to not being able to get an oral temp. Patients rectal temp 96.5.

## 2019-11-06 NOTE — ED Provider Notes (Signed)
[facility] DEPT Provider Note   CSN: [id number] Arrival date & time: 11/06/19  2112     History Chief Complaint  Patient presents with  . Abdominal Pain    [name] is a 59 y.o. female.  Patient complains of abdominal pain and vomiting.  Patient has history of pancreatitis with pseudocyst  The history is provided by the patient. No language interpreter was used.  Abdominal Pain Pain location:  Generalized Pain quality: aching   Pain radiates to:  Periumbilical region Pain severity:  Moderate Onset quality:  Sudden Timing:  Constant Progression:  Worsening Chronicity:  New Context: not alcohol use   Relieved by:  Nothing Associated symptoms: no chest pain, no cough, no diarrhea, no fatigue and no hematuria        Past Medical History:  Diagnosis Date  . Angio-edema   . Arthritis   . CAD (coronary artery disease)    a. s/p NSTEMI in 10/2017 with cath showing LM disease --> s/p CABG on 11/23/2017 with LIMA-LAD and SVG-OM.   [name] Hay fever   . Ischemic cardiomyopathy    a. EF 35-40% by echo in 10/2017  . NSTEMI (non-ST elevated myocardial infarction) ([place])   . PAF (paroxysmal atrial fibrillation) ([place])    Diagnosed by ILR  . Pneumonia 11/16/2017  . Stroke [facility]) 08/23/2017   Left MCA infarct status post TPA and thrombectomy  . Urticaria     Patient Active Problem List   Diagnosis Date Noted  . Hashimoto's thyroiditis 10/22/2019  . Chronic pancreatitis ([place]) 10/21/2019  . Thyroid function study abnormality 10/21/2019  . Pilonidal cyst 10/21/2019  . Irritation of ear, bilateral 10/21/2019  . Urinary incontinence 10/21/2019  . Acute recurrent pancreatitis 10/11/2019  . Gastritis 09/17/2019  . Pancreatic cyst 09/11/2019  . AAA (abdominal aortic aneurysm) ([place]), infrarenal - ulcerative plaque (3.1cm)- 09/10/2019 09/11/2019  . Acute pancreatitis 09/10/2019  . Abnormality of gait 08/27/2019  . Chronic rhinitis 07/09/2019  .  Allergic reaction 07/09/2019  . 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 Carpal tunnel syndrome of left wrist 03/15/2019  . Anxiety 10/24/2018  . Numbness of left hand 10/19/2018  . Spastic hemiparesis affecting dominant side ([place]) 09/20/2018  . Primary osteoarthritis of both knees 07/04/2018  . Iron deficiency 04/23/2018  . Atherosclerosis of aorta ([place]) 02/02/2018  . Osteoarthritis of left knee 01/16/2018  . Osteoarthritis of right knee 01/16/2018  . Sequela of cerebrovascular accident 01/16/2018  . Paroxysmal atrial fibrillation ([place]) 12/19/2017  . Chronic diastolic CHF (congestive heart failure) ([place]) 12/16/2017  . Benign essential HTN   . Supplemental oxygen dependent   . 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 Coronary artery disease 11/23/2017  . NSTEMI (non-ST elevated myocardial infarction) ([place]) 11/16/2017  . Urticaria 10/13/2017  . Former smoker 10/13/2017  . Angioedema   . 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 Cerebrovascular accident (CVA) due to occlusion of left middle cerebral artery ([place])   . Right hemiplegia ([place])   . Dysphagia, post-stroke   . Hyperlipidemia LDL goal <70     Past Surgical History:  Procedure Laterality Date  . CORONARY ARTERY BYPASS GRAFT N/A 11/23/2017   Procedure: CORONARY ARTERY BYPASS GRAFTING (CABG) x 2, ON PUMP, USING LEFT INTERNAL MAMMARY ARTERY AND RIGHT GREATER SAPHENOUS VEIN HARVESTED ENDOSCOPICALLY;  Surgeon: Gaye Pollack, MD;  Location: [facility];  Service: Open Heart Surgery;  Laterality: N/A;  . CRYOABLATION     cervical  . IR ANGIO INTRA EXTRACRAN  SEL COM CAROTID INNOMINATE UNI R MOD SED  08/21/2017  . IR ANGIO VERTEBRAL SEL SUBCLAVIAN INNOMINATE UNI R MOD SED  08/21/2017  . IR PERCUTANEOUS ART THROMBECTOMY/INFUSION INTRACRANIAL INC DIAG ANGIO  08/21/2017  . IR RADIOLOGIST EVAL & MGMT  10/30/2017  . LOOP RECORDER INSERTION N/A 08/28/2017   Procedure: LOOP RECORDER INSERTION;  Surgeon: Constance Haw, MD;  Location: [facility];  Service: Cardiovascular;  Laterality: N/A;  . RADIOLOGY WITH ANESTHESIA N/A 08/21/2017   Procedure: RADIOLOGY WITH ANESTHESIA;  Surgeon: Luanne Bras, MD;  Location: [facility];  Service: Radiology;  Laterality: N/A;  . RIGHT/LEFT HEART CATH AND CORONARY ANGIOGRAPHY N/A 11/20/2017   Procedure: RIGHT/LEFT HEART CATH AND CORONARY ANGIOGRAPHY;  Surgeon: Jettie Booze, MD;  Location: [facility];  Service: Cardiovascular;  Laterality: N/A;  . TEE WITHOUT CARDIOVERSION N/A 08/28/2017   Procedure: TRANSESOPHAGEAL ECHOCARDIOGRAM (TEE);  Surgeon: Fay Records, MD;  Location: [facility];  Service: Cardiovascular;  Laterality: N/A;  . TEE WITHOUT CARDIOVERSION N/A 11/23/2017   Procedure: TRANSESOPHAGEAL ECHOCARDIOGRAM (TEE);  Surgeon: Gaye Pollack, MD;  Location: [facility];  Service: Open Heart Surgery;  Laterality: N/A;  . TUBAL LIGATION       OB History    Gravida  2   Para  2   Term      Preterm      AB      Living  2     SAB      TAB      Ectopic      Multiple      Live Births              Family History  Problem Relation Age of Onset  . Heart attack Father   . Hypertension Mother   . Hyperlipidemia Mother   . Diabetes type II Mother   . Urticaria Mother   . Hypertension Brother   . Breast cancer Cousin 43  . Immunodeficiency Neg Hx   . Eczema Neg Hx   . Atopy Neg Hx   . Asthma Neg Hx   . Angioedema Neg Hx   . Allergic rhinitis Neg Hx   . Colon cancer Neg Hx   . Esophageal cancer Neg Hx     Social History   Tobacco Use  . Smoking status: Former Smoker    Packs/day: 0.10    Years: 37.00    Pack years: 3.70    Types: Cigarettes    Quit date: 07/17/2018    Years since quitting: 1.3  . Smokeless tobacco: Never Used  Substance Use Topics  . Alcohol use: No  . Drug use: No    Home Medications Prior to Admission medications   Medication Sig Start Date End Date Taking? Authorizing Provider  acetaminophen (TYLENOL) 325 MG  tablet Take 2 tablets (650 mg total) by mouth every 6 (six) hours as needed for mild pain or moderate pain (or Fever >/= 101). 09/13/19  Yes Rai, Ripudeep K, MD  azelastine (ASTELIN) 0.1 % nasal spray Place 1-2 sprays into both nostrils 2 (two) times daily as needed for rhinitis. Use in each nostril as directed 09/17/19  Yes Bobbitt, [place] Muta, MD  BACLOFEN PO Take 10 mg by mouth 2 (two) times daily.   Yes [provider]  calcium carbonate (TUMS - DOSED IN MG ELEMENTAL CALCIUM) 500 MG chewable tablet Chew 1 tablet by mouth 2 (two) times daily.    Yes [provider]  carvedilol (  COREG) 3.125 MG tablet Take 1 tablet by mouth twice daily 09/02/19  Yes Satira Sark, MD  cholecalciferol (VITAMIN D) 1000 units tablet Take 1,000 Units by mouth daily.   Yes [provider]  famotidine (PEPCID) 20 MG tablet Take 20 mg by mouth daily.    Yes [provider]  ferrous sulfate 325 (65 FE) MG tablet Take 325 mg by mouth 2 (two) times a week. Monday  and Thursday.   Yes [provider]  hydrOXYzine (ATARAX/VISTARIL) 10 MG tablet Take 1 tablet (10 mg total) by mouth at bedtime. 07/01/19  Yes [name], [name], DO  levocetirizine (XYZAL) 5 MG tablet Take 1 tablet (5 mg total) by mouth every evening. 09/17/19  Yes Bobbitt, [place] Muta, MD  mupirocin cream (BACTROBAN) 2 % Apply 1 application topically 2 (two) times daily. Apply to area on buttocks. Patient taking differently: Apply 1 application topically 2 (two) times daily as needed (rash). Apply to area on buttocks. 10/21/19  Yes [name], [name], DO  ondansetron (ZOFRAN ODT) 4 MG disintegrating tablet Take 1 tablet (4 mg total) by mouth every 8 (eight) hours as needed for nausea or vomiting. 09/13/19  Yes Rai, Ripudeep K, MD  pravastatin (PRAVACHOL) 20 MG tablet Take 1 tablet (20 mg total) by mouth daily. 10/17/19  Yes Strader, [place], PA-C  rivaroxaban (XARELTO) 20 MG TABS tablet Take 1 tablet daily with evening  meal 09/25/19  Yes Satira Sark, MD  sacubitril-valsartan (ENTRESTO) 24-26 MG Take 1 tablet by mouth 2 (two) times daily. 10/17/19  Yes Strader, [place] M, PA-C  traMADol (ULTRAM) 50 MG tablet Take 50 mg by mouth every 6 (six) hours as needed for moderate pain.   Yes [provider]  triamcinolone cream (KENALOG) 0.1 % Apply 1 application topically 2 (two) times daily. 10/21/19  Yes [name], [name], DO  vitamin B-12 (CYANOCOBALAMIN) 100 MCG tablet Take 100 mcg by mouth daily.   Yes [provider]    Allergies    Lopressor [metoprolol tartrate], Allegra allergy [fexofenadine hcl], Dilaudid [hydromorphone], Nsaids, Gabapentin (once-daily), and Lipitor [atorvastatin]  Review of Systems   Review of Systems  Constitutional: Negative for appetite change and fatigue.  HENT: Negative for congestion, ear discharge and sinus pressure.   Eyes: Negative for discharge.  Respiratory: Negative for cough.   Cardiovascular: Negative for chest pain.  Gastrointestinal: Positive for abdominal pain. Negative for diarrhea.  Genitourinary: Negative for frequency and hematuria.  Musculoskeletal: Negative for back pain.  Skin: Negative for rash.  Neurological: Negative for seizures and headaches.  Psychiatric/Behavioral: Negative for hallucinations.    Physical Exam Updated Vital Signs BP 138/84 (BP Location: Left Arm)   Pulse 86   Temp (!) 96.5 F (35.8 C) (Oral)   Resp 15   Ht 5\' 1"  (1.549 m)   Wt 54 kg   LMP  (LMP Unknown)   SpO2 100%   BMI 22.48 kg/m   Physical Exam Vitals and nursing note reviewed.  Constitutional:      Appearance: She is well-developed.  HENT:     Head: Normocephalic.     Nose: Nose normal.  Eyes:     General: No scleral icterus.    Conjunctiva/sclera: Conjunctivae normal.  Neck:     Thyroid: No thyromegaly.  Cardiovascular:     Rate and Rhythm: Normal rate and regular rhythm.     Heart sounds: No murmur. No friction rub. No gallop.     Pulmonary:     Breath  sounds: No stridor. No wheezing or rales.  Chest:     Chest wall: No tenderness.  Abdominal:     General: There is no distension.     Tenderness: There is abdominal tenderness. There is no rebound.  Musculoskeletal:        General: Normal range of motion.     Cervical back: Neck supple.  Lymphadenopathy:     Cervical: No cervical adenopathy.  Skin:    Findings: No erythema or rash.  Neurological:     Mental Status: She is oriented to person, place, and time.     Motor: No abnormal muscle tone.     Coordination: Coordination normal.  Psychiatric:        Behavior: Behavior normal.     ED Results / Procedures / Treatments   Labs (all labs ordered are listed, but only abnormal results are displayed) Labs Reviewed  CBC - Abnormal; Notable for the following components:      Result Value   WBC 17.5 (*)    RBC 5.36 (*)    Hemoglobin 17.5 (*)    HCT 53.9 (*)    MCV 100.6 (*)    Platelets 455 (*)    All other components within normal limits  LIPASE, BLOOD  COMPREHENSIVE METABOLIC PANEL  URINALYSIS, ROUTINE W REFLEX MICROSCOPIC    EKG None  Radiology No results found.  Procedures Procedures (including critical care time)  Medications Ordered in ED Medications  ondansetron (ZOFRAN-ODT) disintegrating tablet 4 mg (has no administration in time range)  sodium chloride flush (NS) 0.9 % injection 3 mL (3 mLs Intravenous Given 11/06/19 2200)  sodium chloride 0.9 % bolus 1,000 mL (1,000 mLs Intravenous New Bag/Given 11/06/19 2201)  ondansetron (ZOFRAN) injection 4 mg (4 mg Intravenous Given 11/06/19 2159)  morphine 4 MG/ML injection 6 mg (6 mg Intravenous Given 11/06/19 2200)    ED Course  I have reviewed the triage vital signs and the nursing notes.  Pertinent labs & imaging results that were available during my care of the patient were reviewed by me and considered in my medical decision making (see chart for details).    MDM  Rules/Calculators/A&P                      Patient with abdominal pain.    Patient admitted for pancreatitis Final Clinical Impression(s) / ED Diagnoses Final diagnoses:  None    Rx / DC Orders ED Discharge Orders    None       Milton Ferguson, MD 11/08/19 1517

## 2019-11-06 NOTE — ED Notes (Signed)
Patients SPO2 was 90% with good pleth. Pt placed on 2L nasal cannula. SPO2 currently 100%.

## 2019-11-06 NOTE — Telephone Encounter (Signed)
Pt assistance forms completed for entresto and Xarelto.

## 2019-11-07 ENCOUNTER — Observation Stay (HOSPITAL_COMMUNITY): Payer: 59

## 2019-11-07 DIAGNOSIS — I2510 Atherosclerotic heart disease of native coronary artery without angina pectoris: Secondary | ICD-10-CM | POA: Diagnosis present

## 2019-11-07 DIAGNOSIS — K859 Acute pancreatitis without necrosis or infection, unspecified: Secondary | ICD-10-CM | POA: Diagnosis present

## 2019-11-07 DIAGNOSIS — K862 Cyst of pancreas: Secondary | ICD-10-CM

## 2019-11-07 DIAGNOSIS — I63512 Cerebral infarction due to unspecified occlusion or stenosis of left middle cerebral artery: Secondary | ICD-10-CM | POA: Diagnosis not present

## 2019-11-07 DIAGNOSIS — K8050 Calculus of bile duct without cholangitis or cholecystitis without obstruction: Secondary | ICD-10-CM | POA: Diagnosis present

## 2019-11-07 DIAGNOSIS — R1084 Generalized abdominal pain: Secondary | ICD-10-CM | POA: Diagnosis present

## 2019-11-07 DIAGNOSIS — I10 Essential (primary) hypertension: Secondary | ICD-10-CM

## 2019-11-07 DIAGNOSIS — Z8249 Family history of ischemic heart disease and other diseases of the circulatory system: Secondary | ICD-10-CM | POA: Diagnosis not present

## 2019-11-07 DIAGNOSIS — IMO0002 Reserved for concepts with insufficient information to code with codable children: Secondary | ICD-10-CM

## 2019-11-07 DIAGNOSIS — E785 Hyperlipidemia, unspecified: Secondary | ICD-10-CM | POA: Diagnosis present

## 2019-11-07 DIAGNOSIS — Z8673 Personal history of transient ischemic attack (TIA), and cerebral infarction without residual deficits: Secondary | ICD-10-CM | POA: Diagnosis not present

## 2019-11-07 DIAGNOSIS — Z8349 Family history of other endocrine, nutritional and metabolic diseases: Secondary | ICD-10-CM | POA: Diagnosis not present

## 2019-11-07 DIAGNOSIS — I252 Old myocardial infarction: Secondary | ICD-10-CM | POA: Diagnosis not present

## 2019-11-07 DIAGNOSIS — Z8701 Personal history of pneumonia (recurrent): Secondary | ICD-10-CM | POA: Diagnosis not present

## 2019-11-07 DIAGNOSIS — K863 Pseudocyst of pancreas: Secondary | ICD-10-CM | POA: Diagnosis present

## 2019-11-07 DIAGNOSIS — Z885 Allergy status to narcotic agent status: Secondary | ICD-10-CM | POA: Diagnosis not present

## 2019-11-07 DIAGNOSIS — R109 Unspecified abdominal pain: Secondary | ICD-10-CM | POA: Insufficient documentation

## 2019-11-07 DIAGNOSIS — M1990 Unspecified osteoarthritis, unspecified site: Secondary | ICD-10-CM | POA: Diagnosis present

## 2019-11-07 DIAGNOSIS — R1010 Upper abdominal pain, unspecified: Secondary | ICD-10-CM | POA: Diagnosis not present

## 2019-11-07 DIAGNOSIS — I513 Intracardiac thrombosis, not elsewhere classified: Secondary | ICD-10-CM | POA: Diagnosis present

## 2019-11-07 DIAGNOSIS — K861 Other chronic pancreatitis: Secondary | ICD-10-CM | POA: Diagnosis present

## 2019-11-07 DIAGNOSIS — I255 Ischemic cardiomyopathy: Secondary | ICD-10-CM | POA: Diagnosis present

## 2019-11-07 DIAGNOSIS — I110 Hypertensive heart disease with heart failure: Secondary | ICD-10-CM | POA: Diagnosis present

## 2019-11-07 DIAGNOSIS — I5032 Chronic diastolic (congestive) heart failure: Secondary | ICD-10-CM

## 2019-11-07 DIAGNOSIS — I714 Abdominal aortic aneurysm, without rupture: Secondary | ICD-10-CM | POA: Diagnosis present

## 2019-11-07 DIAGNOSIS — I480 Paroxysmal atrial fibrillation: Secondary | ICD-10-CM | POA: Diagnosis present

## 2019-11-07 DIAGNOSIS — K8500 Idiopathic acute pancreatitis without necrosis or infection: Secondary | ICD-10-CM | POA: Diagnosis not present

## 2019-11-07 DIAGNOSIS — Z951 Presence of aortocoronary bypass graft: Secondary | ICD-10-CM | POA: Diagnosis not present

## 2019-11-07 DIAGNOSIS — Z886 Allergy status to analgesic agent status: Secondary | ICD-10-CM | POA: Diagnosis not present

## 2019-11-07 DIAGNOSIS — F17210 Nicotine dependence, cigarettes, uncomplicated: Secondary | ICD-10-CM | POA: Diagnosis present

## 2019-11-07 DIAGNOSIS — Z20822 Contact with and (suspected) exposure to covid-19: Secondary | ICD-10-CM | POA: Diagnosis present

## 2019-11-07 HISTORY — DX: Reserved for concepts with insufficient information to code with codable children: IMO0002

## 2019-11-07 HISTORY — DX: Acute pancreatitis without necrosis or infection, unspecified: K85.90

## 2019-11-07 LAB — LIPASE, BLOOD
Lipase: >10000 U/L — ABNORMAL HIGH (ref 11–51)
Lipase: 837 U/L — ABNORMAL HIGH (ref 11–51)

## 2019-11-07 LAB — SARS CORONAVIRUS 2 (TAT 6-24 HRS): SARS Coronavirus 2: NEGATIVE

## 2019-11-07 LAB — URINALYSIS, ROUTINE W REFLEX MICROSCOPIC
Bilirubin Urine: NEGATIVE
Glucose, UA: NEGATIVE mg/dL
Ketones, ur: 20 mg/dL — AB
Nitrite: NEGATIVE
Protein, ur: 100 mg/dL — AB
Specific Gravity, Urine: 1.019 (ref 1.005–1.030)
pH: 6 (ref 5.0–8.0)

## 2019-11-07 LAB — COMPREHENSIVE METABOLIC PANEL
ALT: 14 U/L (ref 0–44)
AST: 17 U/L (ref 15–41)
Albumin: 3.4 g/dL — ABNORMAL LOW (ref 3.5–5.0)
Alkaline Phosphatase: 48 U/L (ref 38–126)
Anion gap: 7 (ref 5–15)
BUN: 16 mg/dL (ref 6–20)
CO2: 24 mmol/L (ref 22–32)
Calcium: 8.4 mg/dL — ABNORMAL LOW (ref 8.9–10.3)
Chloride: 107 mmol/L (ref 98–111)
Creatinine, Ser: 0.64 mg/dL (ref 0.44–1.00)
GFR calc Af Amer: >60 mL/min (ref >60)
GFR calc non Af Amer: >60 mL/min (ref >60)
Glucose, Bld: 122 mg/dL — ABNORMAL HIGH (ref 70–99)
Potassium: 4 mmol/L (ref 3.5–5.1)
Sodium: 138 mmol/L (ref 135–145)
Total Bilirubin: 0.5 mg/dL (ref 0.3–1.2)
Total Protein: 5.8 g/dL — ABNORMAL LOW (ref 6.5–8.1)

## 2019-11-07 LAB — CBC
HCT: 47.6 % — ABNORMAL HIGH (ref 36.0–46.0)
Hemoglobin: 15.3 g/dL — ABNORMAL HIGH (ref 12.0–15.0)
MCH: 32.1 pg (ref 26.0–34.0)
MCHC: 32.1 g/dL (ref 30.0–36.0)
MCV: 100 fL (ref 80.0–100.0)
Platelets: 348 10*3/uL (ref 150–400)
RBC: 4.76 MIL/uL (ref 3.87–5.11)
RDW: 14.3 % (ref 11.5–15.5)
WBC: 14 10*3/uL — ABNORMAL HIGH (ref 4.0–10.5)
nRBC: 0 % (ref 0.0–0.2)

## 2019-11-07 LAB — GLUCOSE, CAPILLARY: Glucose-Capillary: 111 mg/dL — ABNORMAL HIGH (ref 70–99)

## 2019-11-07 MED ORDER — ONDANSETRON HCL 4 MG/2ML IJ SOLN: 4.0000 mg | Freq: Four times a day (QID) | INTRAMUSCULAR | Status: DC | PRN

## 2019-11-07 MED ORDER — PIPERACILLIN-TAZOBACTAM 3.375 G IVPB 30 MIN
3.3750 g | Freq: Four times a day (QID) | INTRAVENOUS | Status: DC
  Administered 2019-11-07: 3.375 g via INTRAVENOUS
  Filled 2019-11-07: qty 50

## 2019-11-07 MED ORDER — PIPERACILLIN-TAZOBACTAM 3.375 G IVPB
3.3750 g | Freq: Three times a day (TID) | INTRAVENOUS | Status: DC
  Administered 2019-11-07 – 2019-11-09 (×7): 3.375 g via INTRAVENOUS
  Filled 2019-11-07 (×8): qty 50

## 2019-11-07 MED ORDER — ACETAMINOPHEN 650 MG RE SUPP: 650.0000 mg | Freq: Four times a day (QID) | RECTAL | Status: DC | PRN

## 2019-11-07 MED ORDER — GADOBUTROL 1 MMOL/ML IV SOLN
6.0000 mL | Freq: Once | INTRAVENOUS | Status: AC | PRN
  Administered 2019-11-07: 6 mL via INTRAVENOUS

## 2019-11-07 MED ORDER — SODIUM CHLORIDE 0.9 % IV SOLN: INTRAVENOUS | Status: DC

## 2019-11-07 MED ORDER — ONDANSETRON HCL 4 MG PO TABS: 4.0000 mg | ORAL_TABLET | Freq: Four times a day (QID) | ORAL | Status: DC | PRN

## 2019-11-07 MED ORDER — MORPHINE SULFATE (PF) 2 MG/ML IV SOLN
2.0000 mg | INTRAVENOUS | Status: DC | PRN
  Administered 2019-11-07 – 2019-11-08 (×5): 2 mg via INTRAVENOUS
  Filled 2019-11-07 (×5): qty 1

## 2019-11-07 MED ORDER — CARVEDILOL 3.125 MG PO TABS
3.1250 mg | ORAL_TABLET | Freq: Two times a day (BID) | ORAL | Status: DC
  Administered 2019-11-07 – 2019-11-08 (×3): 3.125 mg via ORAL
  Filled 2019-11-07 (×3): qty 1

## 2019-11-07 MED ORDER — ENOXAPARIN SODIUM 40 MG/0.4ML ~~LOC~~ SOLN: 40.0000 mg | SUBCUTANEOUS | Status: DC

## 2019-11-07 MED ORDER — RIVAROXABAN 20 MG PO TABS
20.0000 mg | ORAL_TABLET | Freq: Every day | ORAL | Status: DC
  Administered 2019-11-07: 20 mg via ORAL
  Filled 2019-11-07: qty 1

## 2019-11-07 MED ORDER — TRAMADOL HCL 50 MG PO TABS
100.0000 mg | ORAL_TABLET | Freq: Four times a day (QID) | ORAL | Status: DC | PRN
  Administered 2019-11-07 – 2019-11-08 (×3): 100 mg via ORAL
  Filled 2019-11-07 (×3): qty 2

## 2019-11-07 MED ORDER — ACETAMINOPHEN 325 MG PO TABS: 650.0000 mg | ORAL_TABLET | Freq: Four times a day (QID) | ORAL | Status: DC | PRN

## 2019-11-07 NOTE — H&P (Signed)
History and Physical    [name] [id number] DOB: 1961/06/16 DOA: 11/06/2019  PCP: [name], DO (Confirm with patient/family/NH records and if not entered, this has to be entered at [facility] point of entry) Patient coming from: Home  I have personally briefly reviewed patient's old medical records in [place]  Chief Complaint: Intractable abdominal pain with nausea and vomiting  HPI: [name] is a 59 y.o. female with medical history significant of recurrent episodes of acute pancreatitis, atrial fibrillation, stroke and chronic pancreatitis presented to ED with complaints of intractable abdominal pain, nausea and vomiting.  Patient states that the problem started soon after she had a bowl of soup and grilled cheese.  Pain started in epigastric region initially and then become generalized, colicky in nature, 8 out of 10 on the pain scale, did not get better or worse with anything and associated with continuous nausea and 2 episodes of vomiting.  Patient states that he had similar episodes of pain multiple times in the past.  ED Course: On arrival to the hospital patient had temperature of 96.5, heart rate 106, blood pressure 106/77.  Blood work showed WBC count of 17.5 and lipase level greater than 10,000.  CT abdomen showed peripancreatic phlegmon or edema but there was no evidence of pancreatic necrosis.  CT also showed persistent choledocholithiasis with calculi in proximal cystic duct.  CT also showed chronic pancreatic pseudocyst that is not changed from the previous CT.  LFTs within normal limits.  Patient was managed with IV fluids, morphine, IV Zofran and IV Zosyn in the ED.    Review of Systems: As per HPI otherwise 10 point review of systems negative.    Past Medical History:  Diagnosis Date  . Angio-edema   . Arthritis   . CAD (coronary artery disease)    a. s/p NSTEMI in 10/2017 with cath showing LM disease --> s/p CABG on 11/23/2017 with LIMA-LAD and  SVG-OM.   [name] Hay fever   . Ischemic cardiomyopathy    a. EF 35-40% by echo in 10/2017  . NSTEMI (non-ST elevated myocardial infarction) ([place])   . PAF (paroxysmal atrial fibrillation) ([place])    Diagnosed by ILR  . Pneumonia 11/16/2017  . Stroke [facility]) 08/23/2017   Left MCA infarct status post TPA and thrombectomy  . Urticaria     Past Surgical History:  Procedure Laterality Date  . CORONARY ARTERY BYPASS GRAFT N/A 11/23/2017   Procedure: CORONARY ARTERY BYPASS GRAFTING (CABG) x 2, ON PUMP, USING LEFT INTERNAL MAMMARY ARTERY AND RIGHT GREATER SAPHENOUS VEIN HARVESTED ENDOSCOPICALLY;  Surgeon: Gaye Pollack, MD;  Location: [place];  Service: Open Heart Surgery;  Laterality: N/A;  . CRYOABLATION     cervical  . IR ANGIO INTRA EXTRACRAN SEL COM CAROTID INNOMINATE UNI R MOD SED  08/21/2017  . IR ANGIO VERTEBRAL SEL SUBCLAVIAN INNOMINATE UNI R MOD SED  08/21/2017  . IR PERCUTANEOUS ART THROMBECTOMY/INFUSION INTRACRANIAL INC DIAG ANGIO  08/21/2017  . IR RADIOLOGIST EVAL & MGMT  10/30/2017  . LOOP RECORDER INSERTION N/A 08/28/2017   Procedure: LOOP RECORDER INSERTION;  Surgeon: Constance Haw, MD;  Location: [place] CV LAB;  Service: Cardiovascular;  Laterality: N/A;  . RADIOLOGY WITH ANESTHESIA N/A 08/21/2017   Procedure: RADIOLOGY WITH ANESTHESIA;  Surgeon: Luanne Bras, MD;  Location: [place];  Service: Radiology;  Laterality: N/A;  . RIGHT/LEFT HEART CATH AND CORONARY ANGIOGRAPHY N/A 11/20/2017   Procedure: RIGHT/LEFT HEART CATH AND CORONARY ANGIOGRAPHY;  Surgeon:  Jettie Booze, MD;  Location: [place] CV LAB;  Service: Cardiovascular;  Laterality: N/A;  . TEE WITHOUT CARDIOVERSION N/A 08/28/2017   Procedure: TRANSESOPHAGEAL ECHOCARDIOGRAM (TEE);  Surgeon: Fay Records, MD;  Location: [place];  Service: Cardiovascular;  Laterality: N/A;  . TEE WITHOUT CARDIOVERSION N/A 11/23/2017   Procedure: TRANSESOPHAGEAL ECHOCARDIOGRAM (TEE);  Surgeon: Gaye Pollack, MD;   Location: [place];  Service: Open Heart Surgery;  Laterality: N/A;  . TUBAL LIGATION       reports that she quit smoking about 15 months ago. Her smoking use included cigarettes. She has a 3.70 pack-year smoking history. She has never used smokeless tobacco. She reports that she does not drink alcohol or use drugs.  Allergies  Allergen Reactions  . Lopressor [Metoprolol Tartrate] Swelling    Angioedema  . Allegra Allergy [Fexofenadine Hcl] Hives  . Dilaudid [Hydromorphone]     Hallucinations  . Nsaids     On Xarelto  . Gabapentin (Once-Daily) Rash  . Lipitor [Atorvastatin] Diarrhea    Family History  Problem Relation Age of Onset  . Heart attack Father   . Hypertension Mother   . Hyperlipidemia Mother   . Diabetes type II Mother   . Urticaria Mother   . Hypertension Brother   . Breast cancer Cousin 45  . Immunodeficiency Neg Hx   . Eczema Neg Hx   . Atopy Neg Hx   . Asthma Neg Hx   . Angioedema Neg Hx   . Allergic rhinitis Neg Hx   . Colon cancer Neg Hx   . Esophageal cancer Neg Hx      Prior to Admission medications   Medication Sig Start Date End Date Taking? Authorizing Provider  acetaminophen (TYLENOL) 325 MG tablet Take 2 tablets (650 mg total) by mouth every 6 (six) hours as needed for mild pain or moderate pain (or Fever >/= 101). 09/13/19  Yes Rai, Ripudeep K, MD  azelastine (ASTELIN) 0.1 % nasal spray Place 1-2 sprays into both nostrils 2 (two) times daily as needed for rhinitis. Use in each nostril as directed 09/17/19  Yes Bobbitt, [place] Muta, MD  BACLOFEN PO Take 10 mg by mouth 2 (two) times daily.   Yes [provider]  calcium carbonate (TUMS - DOSED IN MG ELEMENTAL CALCIUM) 500 MG chewable tablet Chew 1 tablet by mouth 2 (two) times daily.    Yes [provider]  carvedilol (COREG) 3.125 MG tablet Take 1 tablet by mouth twice daily 09/02/19  Yes Satira Sark, MD  cholecalciferol (VITAMIN D) 1000 units tablet Take 1,000 Units by mouth  daily.   Yes [provider]  famotidine (PEPCID) 20 MG tablet Take 20 mg by mouth daily.    Yes [provider]  ferrous sulfate 325 (65 FE) MG tablet Take 325 mg by mouth 2 (two) times a week. Monday  and Thursday.   Yes [provider]  hydrOXYzine (ATARAX/VISTARIL) 10 MG tablet Take 1 tablet (10 mg total) by mouth at bedtime. 07/01/19  Yes [name], [name], DO  levocetirizine (XYZAL) 5 MG tablet Take 1 tablet (5 mg total) by mouth every evening. 09/17/19  Yes Bobbitt, Sed[place] Muta, MD  mupirocin cream (BACTROBAN) 2 % Apply 1 application topically 2 (two) times daily. Apply to area on buttocks. Patient taking differently: Apply 1 application topically 2 (two) times daily as needed (rash). Apply to area on buttocks. 10/21/19  Yes [name], [name], DO  ondansetron (ZOFRAN ODT) 4 MG disintegrating tablet  Take 1 tablet (4 mg total) by mouth every 8 (eight) hours as needed for nausea or vomiting. 09/13/19  Yes Rai, Ripudeep K, MD  pravastatin (PRAVACHOL) 20 MG tablet Take 1 tablet (20 mg total) by mouth daily. 10/17/19  Yes Strader, Aguilar, PA-C  rivaroxaban (XARELTO) 20 MG TABS tablet Take 1 tablet daily with evening meal 09/25/19  Yes Satira Sark, MD  sacubitril-valsartan (ENTRESTO) 24-26 MG Take 1 tablet by mouth 2 (two) times daily. 10/17/19  Yes Strader, [place] M, PA-C  traMADol (ULTRAM) 50 MG tablet Take 50 mg by mouth every 6 (six) hours as needed for moderate pain.   Yes [provider]  triamcinolone cream (KENALOG) 0.1 % Apply 1 application topically 2 (two) times daily. 10/21/19  Yes [name], [name], DO  vitamin B-12 (CYANOCOBALAMIN) 100 MCG tablet Take 100 mcg by mouth daily.   Yes [provider]    Physical Exam: Vitals:   11/07/19 0130 11/07/19 0200 11/07/19 0230 11/07/19 0306  BP: 104/74 119/81 138/78 (!) 144/91  Pulse: 81 88 81 88  Resp: 15 17 15    Temp:    (!) 97.5 F (36.4 C)  TempSrc:    Oral  SpO2: 99% 99% 100% 100%    Weight:      Height:        Constitutional: NAD, calm, comfortable Vitals:   11/07/19 0130 11/07/19 0200 11/07/19 0230 11/07/19 0306  BP: 104/74 119/81 138/78 (!) 144/91  Pulse: 81 88 81 88  Resp: 15 17 15    Temp:    (!) 97.5 F (36.4 C)  TempSrc:    Oral  SpO2: 99% 99% 100% 100%  Weight:      Height:       Eyes: PERRL, lids and conjunctivae normal ENMT: Mucous membranes are moist. Posterior pharynx clear of any exudate or lesions.Normal dentition.  Neck: normal, supple, no masses, no thyromegaly Respiratory: clear to auscultation bilaterally, no wheezing, no crackles. Normal respiratory effort. No accessory muscle use.  Cardiovascular: Regular rate and rhythm, no murmurs / rubs / gallops. No extremity edema. 2+ pedal pulses. No carotid bruits.  Abdomen: Mild generalized abdominal tenderness on palpation, no masses palpated. No hepatosplenomegaly. Bowel sounds positive.  Musculoskeletal: no clubbing / cyanosis. No joint deformity upper and lower extremities. Good ROM, no contractures. Normal muscle tone.  Skin: no rashes, lesions, ulcers. No induration Neurologic: CN 2-12 grossly intact. Sensation intact, DTR normal. Strength 5/5 in all 4.  Psychiatric: Normal judgment and insight. Alert and oriented x 3. Normal mood.    Labs on Admission: I have personally reviewed following labs and imaging studies  CBC: Recent Labs  Lab 11/06/19 2151  WBC 17.5*  HGB 17.5*  HCT 53.9*  MCV 100.6*  PLT [id number]*   Basic Metabolic Panel: Recent Labs  Lab 11/06/19 2151  NA 141  K 3.5  CL 100  CO2 31  GLUCOSE 146*  BUN 16  CREATININE 0.90  CALCIUM 9.7   GFR: Estimated Creatinine Clearance: 50.8 mL/min (by C-G formula based on SCr of 0.9 mg/dL). Liver Function Tests: Recent Labs  Lab 11/06/19 2151  AST 23  ALT 20  ALKPHOS 65  BILITOT 0.7  PROT 8.3*  ALBUMIN 4.5   Recent Labs  Lab 11/06/19 2151  LIPASE >10,000*   No results for input(s): AMMONIA in the last 168  hours. Coagulation Profile: No results for input(s): INR, PROTIME in the last 168 hours. Cardiac Enzymes: No results for input(s): CKTOTAL, CKMB, CKMBINDEX, TROPONINI in  the last 168 hours. BNP (last 3 results) No results for input(s): PROBNP in the last 8760 hours. HbA1C: No results for input(s): HGBA1C in the last 72 hours. CBG: No results for input(s): GLUCAP in the last 168 hours. Lipid Profile: No results for input(s): CHOL, HDL, LDLCALC, TRIG, CHOLHDL, LDLDIRECT in the last 72 hours. Thyroid Function Tests: No results for input(s): TSH, T4TOTAL, FREET4, T3FREE, THYROIDAB in the last 72 hours. Anemia Panel: No results for input(s): VITAMINB12, FOLATE, FERRITIN, TIBC, IRON, RETICCTPCT in the last 72 hours. Urine analysis:    Component Value Date/Time   COLORURINE YELLOW 11/06/2019 2151   APPEARANCEUR HAZY (A) 11/06/2019 2151   LABSPEC 1.019 11/06/2019 2151   PHURINE 6.0 11/06/2019 2151   GLUCOSEU NEGATIVE 11/06/2019 2151   HGBUR SMALL (A) 11/06/2019 2151   BILIRUBINUR NEGATIVE 11/06/2019 2151   BILIRUBINUR Negative 09/18/2019 1137   KETONESUR 20 (A) 11/06/2019 2151   PROTEINUR 100 (A) 11/06/2019 2151   UROBILINOGEN negative (A) 09/18/2019 1137   NITRITE NEGATIVE 11/06/2019 2151   LEUKOCYTESUR SMALL (A) 11/06/2019 2151    Radiological Exams on Admission: CT ABDOMEN PELVIS W CONTRAST  Addendum Date: 11/07/2019   ADDENDUM REPORT: 11/07/2019 00:39 ADDENDUM: Correction: Results were discussed with Dr. Betsey Holiday, not Dr. Roderic Palau Electronically Signed   By: Lovena Le M.D.   On: 11/07/2019 00:39   Result Date: 11/07/2019 CLINICAL DATA:  Acute generalized abdominal pain with neutropenia, history of pancreatitis EXAM: CT ABDOMEN AND PELVIS WITH CONTRAST TECHNIQUE: Multidetector CT imaging of the abdomen and pelvis was performed using the standard protocol following bolus administration of intravenous contrast. CONTRAST:  59mL OMNIPAQUE IOHEXOL 300 MG/ML  SOLN COMPARISON:  CT  10/11/2019 FINDINGS: Lower chest: Atelectatic changes present in the lung bases. No consolidation or effusion. Normal heart size. No pericardial effusion. Atherosclerotic calcification of the coronary arteries. Hepatobiliary: No focal liver abnormality is seen. Mild dilatation and thickening of the proximal cystic duct to the level 2 small calculi proximal to the junction with the common bile duct (2/23). No other gross biliary ductal dilatation is seen. Pancreas: There is diffuse peripancreatic fat stranding most prominent about the head and uncinate process, to a lesser extent the pancreatic body which is overall increased from comparison study. Redemonstration of a hypoattenuating cystic structure in the pancreatic head which measures 1.7 by 1.8 cm, not significantly changed from comparison study. Slight prominence of the pancreatic duct seen on comparison exam is diminished on today's study. No gross ductal dilatation is seen. Remaining portions of the pancreatic parenchyma enhances normally without evidence of pancreatic necrosis. Inflammation appears predominantly phlegmonous without organized peripancreatic fluid collection. Spleen: Normal in size without focal abnormality. Adrenals/Urinary Tract: Nodular thickening of the left adrenal gland is similar to prior. Right adrenal gland is normal. Scattered subcentimeter hypoattenuating foci throughout both kidneys too small to fully characterize on CT imaging but statistically likely benign. 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 Bilateral nonobstructive nephrolithiasis. No ureteral calculi are seen. Urinary bladder is largely decompressed at the time of exam and therefore poorly evaluated by CT imaging. Small cystocele. Stomach/Bowel: Persistent pre-pyloric hyperenhancement and mural thickening is likely secondary to the pancreatic inflammation but may result in delayed passage of  ingested material given the degree of stomach distention. No small bowel dilatation or wall thickening. A normal appendix is visualized. No colonic dilatation or wall thickening. Scattered colonic diverticula without focal pericolonic inflammation to suggest diverticulitis. Vascular/Lymphatic: Extensive  atherosclerotic calcification of the abdominal aorta and branch vessels. There is redemonstration of a infrarenal abdominal aortic aneurysm measuring up to 3.4 cm in size with irregular mural thrombus and ulcerated plaque (2/38 and 4/63). There is some adjacent stranding in the retroperitoneum arising from the pancreas and about the duodenum but without focal periaortic stranding identified. Reactive adenopathy again seen in the upper abdomen. Reproductive: Venous engorgement the parametrial and gonadal veins. Retroverted uterus. No concerning adnexal lesions. Other: Peripancreatic phlegmon and edema, increased from prior. No free intraperitoneal fluid or air. No organized collections or abscess. Pelvic floor laxity, similar to prior with associated cyst is seal. No bowel containing hernias. Musculoskeletal: Multilevel degenerative changes are present in the imaged portions of the spine. No acute osseous abnormality or suspicious osseous lesion. IMPRESSION: 1. Interval worsening of peripancreatic phlegmon and edema. No organized peripancreatic fluid collection or evidence of pancreatic necrosis. Stable cystic lesion in the head of the pancreas, favoring pseudocyst. 2. Persistent choledocholithiasis with calculi in the proximal cystic duct. Increasing cystic ductal dilatation mural enhancement and inflammation worrisome for developing cholangitis. 3. Persistent pre-pyloric hyperenhancement and mural thickening of the stomach is likely secondary to the pancreatic inflammation but may result in delayed passage of ingested material given the degree of stomach distention. 4. Bilateral nonobstructive nephrolithiasis. 5.  Infrarenal abdominal aortic aneurysm measuring up to 3.4 cm in size with irregular mural thrombus and ulcerated plaque. Recommend followup by ultrasound in 3 years. This recommendation follows ACR consensus guidelines: White Paper of the ACR Incidental Findings Committee II on Vascular Findings. J Am Coll Radiol 2013; 10:789-794. Aortic aneurysm NOS (ICD10-I71.9) 6. Aortic Atherosclerosis (ICD10-I70.0). These results were called by telephone at the time of interpretation on 11/07/2019 at 12:32 am to provider [facility] ZAMMIT , who verbally acknowledged these results. Electronically Signed: By: Lovena Le M.D. On: 11/07/2019 00:33    EKG: Independently reviewed.   Assessment/Plan Principal Problem:    Acute recurrent pancreatitis Patient is admitted to general medical floor. Patient was given 1 L bolus of normal saline in the ED.  Continue IV fluids with normal saline at the rate of 75 mL/h Continue IV Zosyn because of leukocytosis. Continue pain medications as needed according to the pain scale. Continue Zofran as needed for nausea and vomiting N.p.o. at this time.  Patient will be started on liquid diet once she is able to tolerate p.o. diet.  And the diet will be advanced accordingly. GI consultation for further evaluation and recommendations Continue to monitor the lipase level  Active Problems:    Abdominal pain Pain medications as needed according to the pain scale ordered.  Nausea/vomiting Zofran as needed for nausea and vomiting   DVT prophylaxis: Continue home Xarelto 20 mg daily   Code Status: Full code  Disposition Plan: Patient will be discharged to home once her symptoms resolved.   Consults called: Gastroenterology  Admission status: Duration/MedSurg   Edmonia Lynch MD [facility] Pager   If 7PM-7AM, please contact night-coverage [email or web address] Password  11/07/2019, 3:51 AM

## 2019-11-07 NOTE — Progress Notes (Signed)
Pharmacy Antibiotic Note  [name] is a 59 y.o. female admitted on 11/06/2019 with intra- abdominal pain with leukocytosis.  Pharmacy has been consulted for zosyn dosing  Plan: Zosyn 3.375g IV Q8H infused over 4hrs.  Height: 5\' 1"  (154.9 cm) Weight: 119 lb (54 kg) IBW/kg (Calculated) : 47.8  Temp (24hrs), Avg:96.5 F (35.8 C), Min:96.5 F (35.8 C), Max:96.5 F (35.8 C)  Recent Labs  Lab 11/06/19 2151  WBC 17.5*  CREATININE 0.90    Estimated Creatinine Clearance: 50.8 mL/min (by C-G formula based on SCr of 0.9 mg/dL).    Allergies  Allergen Reactions  . Lopressor [Metoprolol Tartrate] Swelling    Angioedema  . Allegra Allergy [Fexofenadine Hcl] Hives  . Dilaudid [Hydromorphone]     Hallucinations  . Nsaids     On Xarelto  . Gabapentin (Once-Daily) Rash  . Lipitor [Atorvastatin] Diarrhea    Antimicrobials this admission: Zosyn 11/06/2019 >>   Dose adjustments this admission: -  Microbiology results: -  Thank you for allowing pharmacy to be a part of this patient's care.  Nani Skillern Crowford 11/07/2019 2:14 AM

## 2019-11-07 NOTE — Consult Note (Signed)
Referring Provider:  Dr. Tana Coast, [facility] Primary Care Physician:  [name], DO Primary Gastroenterologist:  Dr. Bryan Lemma  Reason for Consultation:  Pancreatitis  HPI: [name] is a 59 y.o. female with past medical history listed below who was first known to our service from a hospital stay in mid December where we consulted for evaluation and management of acute pancreatitis.  It was her first episode of pancreatitis of undetermined etiology with no cholelithiasis, normal triglyceride level, no alcohol, no medication changes.  She was then hospitalized again in January again for pancreatitis.  Fortunately on both of those  occasions she turned around quickly.  She was on Lasix and that was discontinued at her January hospitalization as that has been associated with pancreatitis in some people.  She returned to the hospital overnight again with complaints of abdominal pain, vomiting, etc.  Lipase found to be greater than 10,000, but improved this morning to 837.  LFTs normal.  White blood cell count elevated to 17 point 5K down to 14 K this morning.  She is on Zosyn.  She again had a CT scan of the abdomen pelvis with contrast that showed the following  IMPRESSION: 1. Interval worsening of peripancreatic phlegmon and edema. No organized peripancreatic fluid collection or evidence of pancreatic necrosis. Stable cystic lesion in the head of the pancreas, favoring pseudocyst. 2. Persistent choledocholithiasis with calculi in the proximal cystic duct. Increasing cystic ductal dilatation mural enhancement and inflammation worrisome for developing cholangitis. 3. Persistent pre-pyloric hyperenhancement and mural thickening of the stomach is likely secondary to the pancreatic inflammation but may result in delayed passage of ingested material given the degree of stomach distention. 4. Bilateral nonobstructive nephrolithiasis. 5. Infrarenal abdominal aortic aneurysm measuring up to 3.4 cm  in size with irregular mural thrombus and ulcerated plaque. Recommend followup by ultrasound in 3 years. This recommendation follows ACR consensus guidelines: White Paper of the ACR Incidental Findings Committee II on Vascular Findings. J Am Coll Radiol 2013; 10:789-794. Aortic aneurysm NOS (ICD10-I71.9) 6. Aortic Atherosclerosis (ICD10-I70.0).  Subsequent ultrasound then showed the following:  IMPRESSION: 1. No gallstones or bile duct dilation. No sonographic liver abnormality. 2. Hypoechoic pancreatic head lesion consistent with the cystic mass noted on the previous day's CT.  Surgery has been consulted to see the patient as well.  She tells me that she just started feeling poorly again last night.  Has been eating, although very small amounts.  Currently only on 100 mL/h of IV fluids.  She does have history of CHF, but is well compensated and did tolerate rate of 250 mL/h in the past.  Past Medical History:  Diagnosis Date   Angio-edema    Arthritis    CAD (coronary artery disease)    a. s/p NSTEMI in 10/2017 with cath showing LM disease --> s/p CABG on 11/23/2017 with LIMA-LAD and SVG-OM.    Hay fever    Ischemic cardiomyopathy    a. EF 35-40% by echo in 10/2017   NSTEMI (non-ST elevated myocardial infarction) ([place])    PAF (paroxysmal atrial fibrillation) ([place])    Diagnosed by ILR   Pneumonia 11/16/2017   Stroke ([place]) 08/23/2017   Left MCA infarct status post TPA and thrombectomy   Urticaria     Past Surgical History:  Procedure Laterality Date   CORONARY ARTERY BYPASS GRAFT N/A 11/23/2017   Procedure: CORONARY ARTERY BYPASS GRAFTING (CABG) x 2, ON PUMP, USING LEFT INTERNAL MAMMARY ARTERY AND RIGHT GREATER SAPHENOUS VEIN HARVESTED ENDOSCOPICALLY;  Surgeon: Gaye Pollack, MD;  Location: [place];  Service: Open Heart Surgery;  Laterality: N/A;   CRYOABLATION     cervical   IR ANGIO INTRA EXTRACRAN SEL COM CAROTID INNOMINATE UNI R MOD SED  08/21/2017   IR  ANGIO VERTEBRAL SEL SUBCLAVIAN INNOMINATE UNI R MOD SED  08/21/2017   IR PERCUTANEOUS ART THROMBECTOMY/INFUSION INTRACRANIAL INC DIAG ANGIO  08/21/2017   IR RADIOLOGIST EVAL & MGMT  10/30/2017   LOOP RECORDER INSERTION N/A 08/28/2017   Procedure: LOOP RECORDER INSERTION;  Surgeon: Constance Haw, MD;  Location: [place] [facility];  Service: Cardiovascular;  Laterality: N/A;   RADIOLOGY WITH ANESTHESIA N/A 08/21/2017   Procedure: RADIOLOGY WITH ANESTHESIA;  Surgeon: Luanne Bras, MD;  Location: [place];  Service: Radiology;  Laterality: N/A;   RIGHT/LEFT HEART CATH AND CORONARY ANGIOGRAPHY N/A 11/20/2017   Procedure: RIGHT/LEFT HEART CATH AND CORONARY ANGIOGRAPHY;  Surgeon: Jettie Booze, MD;  Location: [place] [facility];  Service: Cardiovascular;  Laterality: N/A;   TEE WITHOUT CARDIOVERSION N/A 08/28/2017   Procedure: TRANSESOPHAGEAL ECHOCARDIOGRAM (TEE);  Surgeon: Fay Records, MD;  Location: [place];  Service: Cardiovascular;  Laterality: N/A;   TEE WITHOUT CARDIOVERSION N/A 11/23/2017   Procedure: TRANSESOPHAGEAL ECHOCARDIOGRAM (TEE);  Surgeon: Gaye Pollack, MD;  Location: [place];  Service: Open Heart Surgery;  Laterality: N/A;   TUBAL LIGATION      Prior to Admission medications   Medication Sig Start Date End Date Taking? Authorizing Provider  acetaminophen (TYLENOL) 325 MG tablet Take 2 tablets (650 mg total) by mouth every 6 (six) hours as needed for mild pain or moderate pain (or Fever >/= 101). 09/13/19  Yes Rai, Ripudeep K, MD  azelastine (ASTELIN) 0.1 % nasal spray Place 1-2 sprays into both nostrils 2 (two) times daily as needed for rhinitis. Use in each nostril as directed 09/17/19  Yes Bobbitt, [place] Muta, MD  BACLOFEN PO Take 10 mg by mouth 2 (two) times daily.   Yes [provider]  calcium carbonate (TUMS - DOSED IN MG ELEMENTAL CALCIUM) 500 MG chewable tablet Chew 1 tablet by mouth 2 (two) times daily.    Yes [provider]    carvedilol (COREG) 3.125 MG tablet Take 1 tablet by mouth twice daily 09/02/19  Yes Satira Sark, MD  cholecalciferol (VITAMIN D) 1000 units tablet Take 1,000 Units by mouth daily.   Yes [provider]  famotidine (PEPCID) 20 MG tablet Take 20 mg by mouth daily.    Yes [provider]  ferrous sulfate 325 (65 FE) MG tablet Take 325 mg by mouth 2 (two) times a week. Monday  and Thursday.   Yes [provider]  hydrOXYzine (ATARAX/VISTARIL) 10 MG tablet Take 1 tablet (10 mg total) by mouth at bedtime. 07/01/19  Yes [name], [name], DO  levocetirizine (XYZAL) 5 MG tablet Take 1 tablet (5 mg total) by mouth every evening. 09/17/19  Yes Bobbitt, [place] Muta, MD  mupirocin cream (BACTROBAN) 2 % Apply 1 application topically 2 (two) times daily. Apply to area on buttocks. Patient taking differently: Apply 1 application topically 2 (two) times daily as needed (rash). Apply to area on buttocks. 10/21/19  Yes [name], [name], DO  ondansetron (ZOFRAN ODT) 4 MG disintegrating tablet Take 1 tablet (4 mg total) by mouth every 8 (eight) hours as needed for nausea or vomiting. 09/13/19  Yes Rai, Ripudeep K, MD  pravastatin (PRAVACHOL) 20 MG tablet Take 1 tablet (20 mg total)  by mouth daily. 10/17/19  Yes Strader, Spencer, PA-C  rivaroxaban (XARELTO) 20 MG TABS tablet Take 1 tablet daily with evening meal 09/25/19  Yes Satira Sark, MD  sacubitril-valsartan (ENTRESTO) 24-26 MG Take 1 tablet by mouth 2 (two) times daily. 10/17/19  Yes Strader, [place] M, PA-C  traMADol (ULTRAM) 50 MG tablet Take 50 mg by mouth every 6 (six) hours as needed for moderate pain.   Yes [provider]  triamcinolone cream (KENALOG) 0.1 % Apply 1 application topically 2 (two) times daily. 10/21/19  Yes [name], [name], DO  vitamin B-12 (CYANOCOBALAMIN) 100 MCG tablet Take 100 mcg by mouth daily.   Yes [provider]    Current Facility-Administered Medications  Medication Dose  Route Frequency Provider Last Rate Last Admin   0.9 %  sodium chloride infusion   Intravenous Continuous Rai, Ripudeep K, MD 100 mL/hr at 11/07/19 0608 Rate Change at 11/07/19 [id number]   acetaminophen (TYLENOL) tablet 650 mg  650 mg Oral Q6H PRN Bunnie Pion Z, DO       Or   acetaminophen (TYLENOL) suppository 650 mg  650 mg Rectal Q6H PRN Bunnie Pion Z, DO       carvedilol (COREG) tablet 3.125 mg  3.125 mg Oral BID Rai, Ripudeep K, MD       morphine 2 MG/ML injection 2 mg  2 mg Intravenous Q4H PRN Bunnie Pion Z, DO   2 mg at 11/07/19 0748   ondansetron (ZOFRAN) tablet 4 mg  4 mg Oral Q6H PRN Bunnie Pion Z, DO       Or   ondansetron [facility]) injection 4 mg  4 mg Intravenous Q6H PRN Bunnie Pion Z, DO       ondansetron (ZOFRAN-ODT) disintegrating tablet 4 mg  4 mg Oral Once PRN Milton Ferguson, MD       piperacillin-tazobactam (ZOSYN) IVPB 3.375 g  3.375 g Intravenous Q8H Bunnie Pion Z, DO 12.5 mL/hr at 11/07/19 0609 3.375 g at 11/07/19 [id number]   rivaroxaban (XARELTO) tablet 20 mg  20 mg Oral Daily Bunnie Pion Z, DO   20 mg at 11/07/19 0945   traMADol (ULTRAM) tablet 100 mg  100 mg Oral Q6H PRN Bunnie Pion Z, DO   100 mg at 11/07/19 [id number]    Allergies as of 11/06/2019 - Review Complete 11/06/2019  Allergen Reaction Noted   Lopressor [metoprolol tartrate] Swelling 09/05/2017   Allegra allergy [fexofenadine hcl] Hives 07/16/2019   Dilaudid [hydromorphone]  09/24/2019   Nsaids  10/24/2018   Gabapentin (once-daily) Rash 07/01/2019   Lipitor [atorvastatin] Diarrhea 10/13/2017    Family History  Problem Relation Age of Onset   Heart attack Father    Hypertension Mother    Hyperlipidemia Mother    Diabetes type II Mother    Urticaria Mother    Hypertension Brother    Breast cancer Cousin 64   Immunodeficiency Neg Hx    Eczema Neg Hx    Atopy Neg Hx    Asthma Neg Hx    Angioedema Neg Hx    Allergic rhinitis Neg Hx    Colon cancer Neg Hx     Esophageal cancer Neg Hx     Social History   Socioeconomic History   Marital status: Married    Spouse name: Not on file   Number of children: 2   Years of education: Not on file   Highest education level: Not on file  Occupational History   Not on file  Tobacco Use  Smoking status: Former Smoker    Packs/day: 0.10    Years: 37.00    Pack years: 3.70    Types: Cigarettes    Quit date: 07/17/2018    Years since quitting: 1.3   Smokeless tobacco: Never Used  Substance and Sexual Activity   Alcohol use: No   Drug use: No   Sexual activity: Yes    Partners: Male  Other Topics Concern   Not on file  Social History Narrative   Married. 2 children.    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    Walks with cane or wheelchair.    Former smoker.    Drinks caffeine.    Smoke alarm in the home.   Firearms in the home.    Wears her seatbelt.    Feels safe In her relationships.    Social Determinants of Health   Financial Resource Strain:    Difficulty of Paying Living Expenses: Not on file  Food Insecurity:    Worried About Charity fundraiser in the Last Year: Not on file   [employer] of Food in the Last Year: Not on file  Transportation Needs:    Lack of Transportation (Medical): Not on file   Lack of Transportation (Non-Medical): Not on file  Physical Activity:    Days of Exercise per Week: Not on file   Minutes of Exercise per Session: Not on file  Stress:    Feeling of Stress : Not on file  Social Connections:    Frequency of Communication with Friends and Family: Not on file   Frequency of Social Gatherings with Friends and Family: Not on file   Attends Religious Services: Not on file   Active Member of Clubs or Organizations: Not on file   Attends Archivist Meetings: Not on file   Marital Status: Not on file  Intimate Partner Violence:    Fear of Current or Ex-Partner: Not on file   Emotionally Abused: Not on file   Physically  Abused: Not on file   Sexually Abused: Not on file    Review of Systems: ROS is O/W negative except as mentioned in HPI.  Physical Exam: Vital signs in last 24 hours: Temp:  [96.5 F (35.8 C)-97.5 F (36.4 C)] 97.5 F (36.4 C) (02/11 0306) Pulse Rate:  [78-106] 88 (02/11 0306) Resp:  [13-20] 15 (02/11 0230) BP: (104-154)/(74-91) 144/91 (02/11 0306) SpO2:  [98 %-100 %] 100 % (02/11 0306) Weight:  [54 kg] 54 kg (02/10 2120)   General:  Alert, well-developed, well-nourished, pleasant and cooperative in NAD Head:  Normocephalic and atraumatic. Eyes:  Sclera clear, no icterus.  Conjunctiva pink. Ears:  Normal auditory acuity. Mouth:  No deformity or lesions.   Neck:  Supple; no masses or thyromegaly. Lungs:  Clear throughout to auscultation.  No wheezes, crackles, or rhonchi.  Heart:  Regular rate and rhythm; no murmurs, clicks, rubs, or gallops. Abdomen:  Soft, non-distended.  BS present.  Epigastric TTP.  Rectal:  Deferred  Msk:  Symmetrical without gross deformities. Pulses:  Normal pulses noted. Extremities:  Without clubbing or edema. Neurologic:  Alert and oriented x 4;  grossly normal neurologically. Skin:  Intact without significant lesions or rashes. Psych:  Alert and cooperative. Normal mood and affect.  Intake/Output from previous day: 02/10 0701 - 02/11 0700 In: 1116.3 [I.V.:66.3; IV Piggyback:1050] Out: -   Lab Results: Recent Labs    11/06/19 2151 11/07/19 0437  WBC 17.5* 14.0*  HGB 17.5* 15.3*  HCT  53.9* 47.6*  PLT 455* 348   BMET Recent Labs    11/06/19 2151 11/07/19 0437  NA 141 138  K 3.5 4.0  CL 100 107  CO2 31 24  GLUCOSE 146* 122*  BUN 16 16  CREATININE 0.90 0.64  CALCIUM 9.7 8.4*   LFT Recent Labs    11/07/19 0437  PROT 5.8*  ALBUMIN 3.4*  AST 17  ALT 14  ALKPHOS 48  BILITOT 0.5   Studies/Results: CT ABDOMEN PELVIS W CONTRAST  Addendum Date: 11/07/2019   ADDENDUM REPORT: 11/07/2019 00:39 ADDENDUM: Correction: Results  were discussed with Dr. Betsey Holiday, not Dr. Roderic Palau Electronically Signed   By: Lovena Le M.D.   On: 11/07/2019 00:39   Result Date: 11/07/2019 CLINICAL DATA:  Acute generalized abdominal pain with neutropenia, history of pancreatitis EXAM: CT ABDOMEN AND PELVIS WITH CONTRAST TECHNIQUE: Multidetector CT imaging of the abdomen and pelvis was performed using the standard protocol following bolus administration of intravenous contrast. CONTRAST:  75mL OMNIPAQUE IOHEXOL 300 MG/ML  SOLN COMPARISON:  CT 10/11/2019 FINDINGS: Lower chest: Atelectatic changes present in the lung bases. No consolidation or effusion. Normal heart size. No pericardial effusion. Atherosclerotic calcification of the coronary arteries. Hepatobiliary: No focal liver abnormality is seen. Mild dilatation and thickening of the proximal cystic duct to the level 2 small calculi proximal to the junction with the common bile duct (2/23). No other gross biliary ductal dilatation is seen. Pancreas: There is diffuse peripancreatic fat stranding most prominent about the head and uncinate process, to a lesser extent the pancreatic body which is overall increased from comparison study. Redemonstration of a hypoattenuating cystic structure in the pancreatic head which measures 1.7 by 1.8 cm, not significantly changed from comparison study. Slight prominence of the pancreatic duct seen on comparison exam is diminished on today's study. No gross ductal dilatation is seen. Remaining portions of the pancreatic parenchyma enhances normally without evidence of pancreatic necrosis. Inflammation appears predominantly phlegmonous without organized peripancreatic fluid collection. Spleen: Normal in size without focal abnormality. Adrenals/Urinary Tract: Nodular thickening of the left adrenal gland is similar to prior. Right adrenal gland is normal. Scattered subcentimeter hypoattenuating foci throughout both kidneys too small to fully characterize on CT imaging but  statistically likely benign. 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 Bilateral nonobstructive nephrolithiasis. No ureteral calculi are seen. Urinary bladder is largely decompressed at the time of exam and therefore poorly evaluated by CT imaging. Small cystocele. Stomach/Bowel: Persistent pre-pyloric hyperenhancement and mural thickening is likely secondary to the pancreatic inflammation but may result in delayed passage of ingested material given the degree of stomach distention. No small bowel dilatation or wall thickening. A normal appendix is visualized. No colonic dilatation or wall thickening. Scattered colonic diverticula without focal pericolonic inflammation to suggest diverticulitis. Vascular/Lymphatic: Extensive atherosclerotic calcification of the abdominal aorta and branch vessels. There is redemonstration of a infrarenal abdominal aortic aneurysm measuring up to 3.4 cm in size with irregular mural thrombus and ulcerated plaque (2/38 and 4/63). There is some adjacent stranding in the retroperitoneum arising from the pancreas and about the duodenum but without focal periaortic stranding identified. Reactive adenopathy again seen in the upper abdomen. Reproductive: Venous engorgement the parametrial and gonadal veins. Retroverted uterus. No concerning adnexal lesions. Other: Peripancreatic phlegmon and edema, increased from prior. No free intraperitoneal fluid or air. No organized collections or abscess. Pelvic floor laxity, similar to prior with associated cyst is seal.  No bowel containing hernias. Musculoskeletal: Multilevel degenerative changes are present in the imaged portions of the spine. No acute osseous abnormality or suspicious osseous lesion. IMPRESSION: 1. Interval worsening of peripancreatic phlegmon and edema. No organized peripancreatic fluid collection or evidence of pancreatic necrosis. Stable cystic lesion in  the head of the pancreas, favoring pseudocyst. 2. Persistent choledocholithiasis with calculi in the proximal cystic duct. Increasing cystic ductal dilatation mural enhancement and inflammation worrisome for developing cholangitis. 3. Persistent pre-pyloric hyperenhancement and mural thickening of the stomach is likely secondary to the pancreatic inflammation but may result in delayed passage of ingested material given the degree of stomach distention. 4. Bilateral nonobstructive nephrolithiasis. 5. Infrarenal abdominal aortic aneurysm measuring up to 3.4 cm in size with irregular mural thrombus and ulcerated plaque. Recommend followup by ultrasound in 3 years. This recommendation follows ACR consensus guidelines: White Paper of the ACR Incidental Findings Committee II on Vascular Findings. J Am Coll Radiol 2013; 10:789-794. Aortic aneurysm NOS (ICD10-I71.9) 6. Aortic Atherosclerosis (ICD10-I70.0). These results were called by telephone at the time of interpretation on 11/07/2019 at 12:32 am to provider [facility] ZAMMIT , who verbally acknowledged these results. Electronically Signed: By: Lovena Le M.D. On: 11/07/2019 00:33   US Abdomen Limited RUQ  Result Date: 11/07/2019 CLINICAL DATA:  Pancreatitis.  Cystic pancreatic mass on CT. EXAM: ULTRASOUND ABDOMEN LIMITED RIGHT UPPER QUADRANT COMPARISON:  CT, 11/06/2019 FINDINGS: Gallbladder: No gallstones or wall thickening visualized. No sonographic Murphy sign noted by sonographer. Common bile duct: Diameter: 5 mm Liver: Normal echogenicity. Normal size. No mass or focal lesion. Portal vein is patent on color Doppler imaging with normal direction of blood flow towards the liver. Other: In the pancreatic head, there is a hypoechoic area measuring 2.9 x 2.6 x 3.0 cm, which corresponds to the cystic appearing mass noted on the previous day's CT. IMPRESSION: 1. No gallstones or bile duct dilation. No sonographic liver abnormality. 2. Hypoechoic pancreatic head lesion  consistent with the cystic mass noted on the previous day's CT. Electronically Signed   By: Lajean Manes M.D.   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  76. 59 yo female with acute / subacute pancreatitis of unclear etiology.  First episode in December then hospitalized again in January and then again now with lipase >10K on admission.  Etiology of pancreatitis remains elusive. There was question of autoimmune etiology (interestingly her pain did get better with steroids given for autoimmune urticaria), however, she has a normal IgG Subclass 4 level. Lasix was the only medication that she was taking at home which has been associated with pancreatitis in some people, but this has been on hold since January hospitalization.  On this occasion CT scan suggested cystic duct stone, but LFT's are normal and subsequent ultrasound did not suggest this, showed normal CBD, etc.  2. PAF, on Xarelto:  Last dose today, 2/11 at 10 AM.  3. Chronic diastolic CHF, appears compensated.   PLAN: -MRCP today. -I have increased her fluids to 250 mL/hr for one liter then will decrease back again to 100 mL/hr. -Pain management, antiemetics, etc.   Laban Emperor. Michio Thier  11/07/2019, 12:31 PM

## 2019-11-07 NOTE — Consult Note (Signed)
[facility] Surgery Consult Note  [name] 19-Aug-1961  [id number].    Requesting MD: Tyler Pita Chief Complaint:  Intractable abdominal pain with nausea and vomiting  Reason for Consult: recurrent pancreatitis - possible gallstone pancreatitis  HPI:  Patient is a 59 year old female with a history of recurrent acute pancreatitis.  Her last admission was 1/15-1/18/2021 for acute recurrent pancreatitis.  Her lipase on admission was 2834, amylase 677.  Her symptoms resolved she was seen by GI and was scheduled for follow-up with GI on 11/11/2019.  Patient returned to ED last evening with recurrent abdominal pain that started around 5 PM.  She was also having nausea and vomiting.  She was afebrile.    CT scan from 09/10/2019 showed inflammation surround the pancreatic body and tail concerning for acute pancreatitis.  There was a 2.2 cm cystic lesion within the pancreatic head with no ductal dilatation.  Hepatobiliary exam at that time was unremarkable.  Repeat CT on readmission 10/11/2019, again showed no gallstones or gallbladder wall thickening or biliary dilatation.  There was mild peripancreatic fat stranding most prominent about the head and the use in a process, to a lesser extent the pancreatic body.  There is a cystic lesion in the pancreatic head measuring 1.8 x 1.7 x 1.8 cm.  This was actually smaller than the previous exam where it was 2.2 x 1.9 x 2.0 cm.  There is also some possibly reactive prepyloric stomach and proximal duodenum.  Work-up on return to the ED last evening shows she is afebrile vital signs are stable.  Blood pressure is mildly elevated.  Lipase is greater than 10,000.  Leukosis 146, remaining LFTs are normal.  Total protein 8.3, WBC 17.5, hemoglobin 17.5, hematocrit 53.9, platelets 455,000.  Covid is still pending urinalysis showed 21-50 RBC/HPF, 11-20 WBC/hpf.    CT of the abdomen completed early this a.m. shows diffuse pancreatic fat stranding most prominent about  the head and use in a process to a lesser extent the pancreatic body which is overall increased from comparison study.  Redemonstration of hypoattenuating cystic structure in the pancreatic head measuring 1.7 x 1.8 cm.  Not significantly changed from prior study.  Significant progress of the pancreatic duct seen on comparison exam is diminished on today's study.  No gross ductal dilatation is seen.  Remaining portion of the pancreatic parenchymal enhances normally without evidence of pancreatic necrosis.  Inflammation appears to be predominantly phlegmonous without organized peripancreatic fluid collection.  There was no focal liver abnormality seen there was mild dilatation and  thickening of the proximal cystic duct, 2 small calculi proximal to the junction of the common bile duct no gross biliary dilatation was seen.    Abdominal ultrasound obtained this morning showed no gallstones or wall thickening visualized no sonographic Murphy sign.  Common bile duct was 5 mm.  Liver was normal.  Pancreatic head is hypoechoic and measures 2.9 x 2.6 x 3 cm corresponds to the cystic appearing mass noted on previous CT.    ROS: Review of Systems  Constitutional: Positive for fever and weight loss.  HENT: Negative.   Eyes: Negative.   Respiratory: Negative.   Cardiovascular: Positive for leg swelling.  Gastrointestinal: Positive for abdominal pain, constipation, heartburn, nausea and vomiting.  Genitourinary: Negative.   Musculoskeletal: Positive for back pain.  Skin: Negative.   Neurological: Positive for weakness (residual right upper and lower extremity weakness ).  Endo/Heme/Allergies: Bruises/bleeds easily.  Psychiatric/Behavioral: Negative.     Family History  Problem Relation  Age of Onset  . Heart attack Father   . Hypertension Mother   . Hyperlipidemia Mother   . Diabetes type II Mother   . Urticaria Mother   . Hypertension Brother   . Breast cancer Cousin 66  . Immunodeficiency Neg Hx    . Eczema Neg Hx   . Atopy Neg Hx   . Asthma Neg Hx   . Angioedema Neg Hx   . Allergic rhinitis Neg Hx   . Colon cancer Neg Hx   . Esophageal cancer Neg Hx     Past Medical History:  Diagnosis Date  . Angio-edema   . Arthritis   . CAD (coronary artery disease)    a. s/p NSTEMI in 10/2017 with cath showing LM disease --> s/p CABG on 11/23/2017 with LIMA-LAD and SVG-OM.   [name] Hay fever   . Ischemic cardiomyopathy    a. EF 35-40% by echo in 10/2017  . NSTEMI (non-ST elevated myocardial infarction) ([place])   . PAF (paroxysmal atrial fibrillation) ([place])    Diagnosed by ILR  . Pneumonia 11/16/2017  . Stroke [facility]) 08/23/2017   Left MCA infarct status post TPA and thrombectomy  . Urticaria     Past Surgical History:  Procedure Laterality Date  . CORONARY ARTERY BYPASS GRAFT N/A 11/23/2017   Procedure: CORONARY ARTERY BYPASS GRAFTING (CABG) x 2, ON PUMP, USING LEFT INTERNAL MAMMARY ARTERY AND RIGHT GREATER SAPHENOUS VEIN HARVESTED ENDOSCOPICALLY;  Surgeon: Gaye Pollack, MD;  Location: [place];  Service: Open Heart Surgery;  Laterality: N/A;  . CRYOABLATION     cervical  . IR ANGIO INTRA EXTRACRAN SEL COM CAROTID INNOMINATE UNI R MOD SED  08/21/2017  . IR ANGIO VERTEBRAL SEL SUBCLAVIAN INNOMINATE UNI R MOD SED  08/21/2017  . IR PERCUTANEOUS ART THROMBECTOMY/INFUSION INTRACRANIAL INC DIAG ANGIO  08/21/2017  . IR RADIOLOGIST EVAL & MGMT  10/30/2017  . LOOP RECORDER INSERTION N/A 08/28/2017   Procedure: LOOP RECORDER INSERTION;  Surgeon: Constance Haw, MD;  Location: [place] [facility];  Service: Cardiovascular;  Laterality: N/A;  . RADIOLOGY WITH ANESTHESIA N/A 08/21/2017   Procedure: RADIOLOGY WITH ANESTHESIA;  Surgeon: Luanne Bras, MD;  Location: [place];  Service: Radiology;  Laterality: N/A;  . RIGHT/LEFT HEART CATH AND CORONARY ANGIOGRAPHY N/A 11/20/2017   Procedure: RIGHT/LEFT HEART CATH AND CORONARY ANGIOGRAPHY;  Surgeon: Jettie Booze, MD;  Location: [place] [facility]  LAB;  Service: Cardiovascular;  Laterality: N/A;  . TEE WITHOUT CARDIOVERSION N/A 08/28/2017   Procedure: TRANSESOPHAGEAL ECHOCARDIOGRAM (TEE);  Surgeon: Fay Records, MD;  Location: [place];  Service: Cardiovascular;  Laterality: N/A;  . TEE WITHOUT CARDIOVERSION N/A 11/23/2017   Procedure: TRANSESOPHAGEAL ECHOCARDIOGRAM (TEE);  Surgeon: Gaye Pollack, MD;  Location: [place];  Service: Open Heart Surgery;  Laterality: N/A;  . TUBAL LIGATION      Social History:  reports that she quit smoking about 15 months ago. Her smoking use included cigarettes. She has a 3.70 pack-year smoking history. She has never used smokeless tobacco. She reports that she does not drink alcohol or use drugs.  Tobacco:  Started in her 3's; Still smoking some now <1/2 PPD ETOH:  None Drugs:  None  Lives with her husband  Allergies:  Allergies  Allergen Reactions  . Lopressor [Metoprolol Tartrate] Swelling    Angioedema  . Allegra Allergy [Fexofenadine Hcl] Hives  . Dilaudid [Hydromorphone]     Hallucinations  . Nsaids     On Xarelto  . Gabapentin (Once-Daily)  Rash  . Lipitor [Atorvastatin] Diarrhea    Medications Prior to Admission  Medication Sig Dispense Refill  . acetaminophen (TYLENOL) 325 MG tablet Take 2 tablets (650 mg total) by mouth every 6 (six) hours as needed for mild pain or moderate pain (or Fever >/= 101).    [name] azelastine (ASTELIN) 0.1 % nasal spray Place 1-2 sprays into both nostrils 2 (two) times daily as needed for rhinitis. Use in each nostril as directed 30 mL 5  . BACLOFEN PO Take 10 mg by mouth 2 (two) times daily.    . calcium carbonate (TUMS - DOSED IN MG ELEMENTAL CALCIUM) 500 MG chewable tablet Chew 1 tablet by mouth 2 (two) times daily.     . carvedilol (COREG) 3.125 MG tablet Take 1 tablet by mouth twice daily 180 tablet 1  . cholecalciferol (VITAMIN D) 1000 units tablet Take 1,000 Units by mouth daily.    . famotidine (PEPCID) 20 MG tablet Take 20 mg by mouth daily.      . ferrous sulfate 325 (65 FE) MG tablet Take 325 mg by mouth 2 (two) times a week. Monday  and Thursday.    . hydrOXYzine (ATARAX/VISTARIL) 10 MG tablet Take 1 tablet (10 mg total) by mouth at bedtime. 90 tablet 1  . levocetirizine (XYZAL) 5 MG tablet Take 1 tablet (5 mg total) by mouth every evening. 60 tablet 5  . mupirocin cream (BACTROBAN) 2 % Apply 1 application topically 2 (two) times daily. Apply to area on buttocks. (Patient taking differently: Apply 1 application topically 2 (two) times daily as needed (rash). Apply to area on buttocks.) 15 g 0  . ondansetron (ZOFRAN ODT) 4 MG disintegrating tablet Take 1 tablet (4 mg total) by mouth every 8 (eight) hours as needed for nausea or vomiting. 30 tablet 0  . pravastatin (PRAVACHOL) 20 MG tablet Take 1 tablet (20 mg total) by mouth daily. 30 tablet 3  . rivaroxaban (XARELTO) 20 MG TABS tablet Take 1 tablet daily with evening meal 90 tablet 3  . sacubitril-valsartan (ENTRESTO) 24-26 MG Take 1 tablet by mouth 2 (two) times daily. 60 tablet   . traMADol (ULTRAM) 50 MG tablet Take 50 mg by mouth every 6 (six) hours as needed for moderate pain.    [name] triamcinolone cream (KENALOG) 0.1 % Apply 1 application topically 2 (two) times daily. 45 g 3  . vitamin B-12 (CYANOCOBALAMIN) 100 MCG tablet Take 100 mcg by mouth daily.      Blood pressure (!) 144/91, pulse 88, temperature (!) 97.5 F (36.4 C), temperature source Oral, resp. rate 15, height 5\' 1"  (1.549 m), weight 54 kg, SpO2 100 %. Physical Exam:  General: pleasant, WD, WN white female who is laying in bed in NAD HEENT: head is normocephalic, atraumatic.  Sclera are noninjected.  Pupils are equal. Mouth is pink and moist Heart: regular, rate, and rhythm.  Normal s1,s2. No obvious murmurs, gallops, or rubs noted. mediastinotomy scar well healed. Palpable radial and pedal pulses bilaterally Lungs: CTAB, no wheezes, rhonchi, or rales noted.  Respiratory effort nonlabored Abd: soft,Generalized  tenderness, but worst over the left side upper abdomen, +BS, no masses, hernias, or organomegaly MS: all 4 extremities are symmetrical with no cyanosis, clubbing, or edema. Skin: warm and dry with no masses, lesions, or rashes Neuro: Cranial nerves 2-12 grossly intact, speech is normal. Residual RUE/RLE weakness Psych: A&Ox3, sleepy with an appropriate affect.   Results for orders placed or performed during the hospital encounter of 11/06/19 (from  the past 48 hour(s))  Lipase, blood     Status: Abnormal   Collection Time: 11/06/19  9:51 PM  Result Value Ref Range   Lipase >10,000 (H) 11 - 51 U/L    Comment: RESULTS CONFIRMED BY MANUAL DILUTION REPEATED TO VERIFY Performed at [facility] [address], [place], [place] [address]   Comprehensive metabolic panel     Status: Abnormal   Collection Time: 11/06/19  9:51 PM  Result Value Ref Range   Sodium 141 135 - 145 mmol/L   Potassium 3.5 3.5 - 5.1 mmol/L   Chloride 100 98 - 111 mmol/L   CO2 31 22 - 32 mmol/L   Glucose, Bld 146 (H) 70 - 99 mg/dL   BUN 16 6 - 20 mg/dL   Creatinine, Ser 0.90 0.44 - 1.00 mg/dL   Calcium 9.7 8.9 - 10.3 mg/dL   Total Protein 8.3 (H) 6.5 - 8.1 g/dL   Albumin 4.5 3.5 - 5.0 g/dL   AST 23 15 - 41 U/L   ALT 20 0 - 44 U/L   Alkaline Phosphatase 65 38 - 126 U/L   Total Bilirubin 0.7 0.3 - 1.2 mg/dL   GFR calc non Af Amer >60 >60 mL/min   GFR calc Af Amer >60 >60 mL/min   Anion gap 10 5 - 15    Comment: Performed at [facility], [place] [address], [place], [place] [address]  CBC     Status: Abnormal   Collection Time: 11/06/19  9:51 PM  Result Value Ref Range   WBC 17.5 (H) 4.0 - 10.5 K/uL   RBC 5.36 (H) 3.87 - 5.11 MIL/uL   Hemoglobin 17.5 (H) 12.0 - 15.0 g/dL   HCT 53.9 (H) 36.0 - 46.0 %   MCV 100.6 (H) 80.0 - 100.0 fL   MCH 32.6 26.0 - 34.0 pg   MCHC 32.5 30.0 - 36.0 g/dL   RDW 14.3 11.5 - 15.5 %   Platelets 455 (H) 150 - 400 K/uL   nRBC 0.0 0.0 - 0.2 %     Comment: Performed at [facility], [place] [address], [place], [place] [address]  Urinalysis, Routine w reflex microscopic     Status: Abnormal   Collection Time: 11/06/19  9:51 PM  Result Value Ref Range   Color, Urine YELLOW YELLOW   APPearance HAZY (A) CLEAR   Specific Gravity, Urine 1.019 1.005 - 1.030   pH 6.0 5.0 - 8.0   Glucose, UA NEGATIVE NEGATIVE mg/dL   Hgb urine dipstick SMALL (A) NEGATIVE   Bilirubin Urine NEGATIVE NEGATIVE   Ketones, ur 20 (A) NEGATIVE mg/dL   Protein, ur 100 (A) NEGATIVE mg/dL   Nitrite NEGATIVE NEGATIVE   Leukocytes,Ua SMALL (A) NEGATIVE   RBC / HPF 21-50 0 - 5 RBC/hpf   WBC, UA 11-20 0 - 5 WBC/hpf   Bacteria, UA RARE (A) NONE SEEN   Squamous Epithelial / LPF 0-5 0 - 5   Mucus PRESENT    Hyaline Casts, UA PRESENT    Granular Casts, UA PRESENT     Comment: Performed at [facility], [place] [address] [place], [place] [address]  Comprehensive metabolic panel     Status: Abnormal   Collection Time: 11/07/19  4:37 AM  Result Value Ref Range   Sodium 138 135 - 145 mmol/L   Potassium 4.0 3.5 - 5.1 mmol/L   Chloride 107 98 - 111 mmol/L   CO2 24 22 - 32  mmol/L   Glucose, Bld 122 (H) 70 - 99 mg/dL   BUN 16 6 - 20 mg/dL   Creatinine, Ser 0.64 0.44 - 1.00 mg/dL   Calcium 8.4 (L) 8.9 - 10.3 mg/dL   Total Protein 5.8 (L) 6.5 - 8.1 g/dL   Albumin 3.4 (L) 3.5 - 5.0 g/dL   AST 17 15 - 41 U/L   ALT 14 0 - 44 U/L   Alkaline Phosphatase 48 38 - 126 U/L   Total Bilirubin 0.5 0.3 - 1.2 mg/dL   GFR calc non Af Amer >60 >60 mL/min   GFR calc Af Amer >60 >60 mL/min   Anion gap 7 5 - 15    Comment: Performed at [facility], [place] [address], [place], [place] [address]  CBC     Status: Abnormal   Collection Time: 11/07/19  4:37 AM  Result Value Ref Range   WBC 14.0 (H) 4.0 - 10.5 K/uL   RBC 4.76 3.87 - 5.11 MIL/uL   Hemoglobin 15.3 (H) 12.0 - 15.0 g/dL   HCT 47.6 (H) 36.0 - 46.0 %   MCV 100.0 80.0 - 100.0 fL    MCH 32.1 26.0 - 34.0 pg   MCHC 32.1 30.0 - 36.0 g/dL   RDW 14.3 11.5 - 15.5 %   Platelets 348 150 - 400 K/uL   nRBC 0.0 0.0 - 0.2 %    Comment: Performed at [facility], [place] [address], [place], [place] [address]  Lipase, blood     Status: Abnormal   Collection Time: 11/07/19  4:37 AM  Result Value Ref Range   Lipase 837 (H) 11 - 51 U/L    Comment: RESULTS CONFIRMED BY MANUAL DILUTION Performed at [facility], [place] [address], [place], [place] [address]   Glucose, capillary     Status: Abnormal   Collection Time: 11/07/19  8:00 AM  Result Value Ref Range   Glucose-Capillary 111 (H) 70 - 99 mg/dL   CT ABDOMEN PELVIS W CONTRAST  Addendum Date: 11/07/2019   ADDENDUM REPORT: 11/07/2019 00:39 ADDENDUM: Correction: Results were discussed with Dr. Betsey Holiday, not Dr. Roderic Palau Electronically Signed   By: Lovena Le M.D.   On: 11/07/2019 00:39   Result Date: 11/07/2019 CLINICAL DATA:  Acute generalized abdominal pain with neutropenia, history of pancreatitis EXAM: CT ABDOMEN AND PELVIS WITH CONTRAST TECHNIQUE: Multidetector CT imaging of the abdomen and pelvis was performed using the standard protocol following bolus administration of intravenous contrast. CONTRAST:  45mL OMNIPAQUE IOHEXOL 300 MG/ML  SOLN COMPARISON:  CT 10/11/2019 FINDINGS: Lower chest: Atelectatic changes present in the lung bases. No consolidation or effusion. Normal heart size. No pericardial effusion. Atherosclerotic calcification of the coronary arteries. Hepatobiliary: No focal liver abnormality is seen. Mild dilatation and thickening of the proximal cystic duct to the level 2 small calculi proximal to the junction with the common bile duct (2/23). No other gross biliary ductal dilatation is seen. Pancreas: There is diffuse peripancreatic fat stranding most prominent about the head and uncinate process, to a lesser extent the pancreatic body which is overall increased from comparison study.  Redemonstration of a hypoattenuating cystic structure in the pancreatic head which measures 1.7 by 1.8 cm, not significantly changed from comparison study. Slight prominence of the pancreatic duct seen on comparison exam is diminished on today's study. No gross ductal dilatation is seen. Remaining portions of the pancreatic parenchyma enhances normally without evidence of pancreatic necrosis. Inflammation appears predominantly phlegmonous without organized peripancreatic  fluid collection. Spleen: Normal in size without focal abnormality. Adrenals/Urinary Tract: Nodular thickening of the left adrenal gland is similar to prior. Right adrenal gland is normal. Scattered subcentimeter hypoattenuating foci throughout both kidneys too small to fully characterize on CT imaging but statistically likely benign. 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 Bilateral nonobstructive nephrolithiasis. No ureteral calculi are seen. Urinary bladder is largely decompressed at the time of exam and therefore poorly evaluated by CT imaging. Small cystocele. Stomach/Bowel: Persistent pre-pyloric hyperenhancement and mural thickening is likely secondary to the pancreatic inflammation but may result in delayed passage of ingested material given the degree of stomach distention. No small bowel dilatation or wall thickening. A normal appendix is visualized. No colonic dilatation or wall thickening. Scattered colonic diverticula without focal pericolonic inflammation to suggest diverticulitis. Vascular/Lymphatic: Extensive atherosclerotic calcification of the abdominal aorta and branch vessels. There is redemonstration of a infrarenal abdominal aortic aneurysm measuring up to 3.4 cm in size with irregular mural thrombus and ulcerated plaque (2/38 and 4/63). There is some adjacent stranding in the retroperitoneum arising from the pancreas and about the duodenum but  without focal periaortic stranding identified. Reactive adenopathy again seen in the upper abdomen. Reproductive: Venous engorgement the parametrial and gonadal veins. Retroverted uterus. No concerning adnexal lesions. Other: Peripancreatic phlegmon and edema, increased from prior. No free intraperitoneal fluid or air. No organized collections or abscess. Pelvic floor laxity, similar to prior with associated cyst is seal. No bowel containing hernias. Musculoskeletal: Multilevel degenerative changes are present in the imaged portions of the spine. No acute osseous abnormality or suspicious osseous lesion. IMPRESSION: 1. Interval worsening of peripancreatic phlegmon and edema. No organized peripancreatic fluid collection or evidence of pancreatic necrosis. Stable cystic lesion in the head of the pancreas, favoring pseudocyst. 2. Persistent choledocholithiasis with calculi in the proximal cystic duct. Increasing cystic ductal dilatation mural enhancement and inflammation worrisome for developing cholangitis. 3. Persistent pre-pyloric hyperenhancement and mural thickening of the stomach is likely secondary to the pancreatic inflammation but may result in delayed passage of ingested material given the degree of stomach distention. 4. Bilateral nonobstructive nephrolithiasis. 5. Infrarenal abdominal aortic aneurysm measuring up to 3.4 cm in size with irregular mural thrombus and ulcerated plaque. Recommend followup by ultrasound in 3 years. This recommendation follows ACR consensus guidelines: White Paper of the ACR Incidental Findings Committee II on Vascular Findings. J Am Coll Radiol 2013; 10:789-794. Aortic aneurysm NOS (ICD10-I71.9) 6. Aortic Atherosclerosis (ICD10-I70.0). These results were called by telephone at the time of interpretation on 11/07/2019 at 12:32 am to provider [facility] , who verbally acknowledged these results. Electronically Signed: By: Lovena Le M.D. On: 11/07/2019 00:33   US Abdomen  Limited RUQ  Result Date: 11/07/2019 CLINICAL DATA:  Pancreatitis.  Cystic pancreatic mass on CT. EXAM: ULTRASOUND ABDOMEN LIMITED RIGHT UPPER QUADRANT COMPARISON:  CT, 11/06/2019 FINDINGS: Gallbladder: No gallstones or wall thickening visualized. No sonographic Murphy sign noted by sonographer. Common bile duct: Diameter: 5 mm Liver: Normal echogenicity. Normal size. No mass or focal lesion. Portal vein is patent on color Doppler imaging with normal direction of blood flow towards the liver. Other: In the pancreatic head, there is a hypoechoic area measuring 2.9 x 2.6 x 3.0 cm, which corresponds to the cystic appearing mass noted on the previous day's CT. IMPRESSION: 1. No gallstones or bile duct dilation. No sonographic liver abnormality. 2. Hypoechoic pancreatic head lesion consistent with the cystic  mass noted on the previous day's CT. Electronically Signed   By: Lajean Manes M.D.   On: 11/07/2019 10:06   . sodium chloride 100 mL/hr at 11/07/19 0608  . piperacillin-tazobactam (ZOSYN)  IV 3.375 g (11/07/19 0609)   CMP Latest Ref Rng & Units 11/07/2019 11/06/2019 10/14/2019  Glucose 70 - 99 mg/dL 122(H) 146(H) 92  BUN 6 - 20 mg/dL 16 16 9   Creatinine 0.44 - 1.00 mg/dL 0.64 0.90 0.48  Sodium 135 - 145 mmol/L 138 141 139  Potassium 3.5 - 5.1 mmol/L 4.0 3.5 3.7  Chloride 98 - 111 mmol/L 107 100 108  CO2 22 - 32 mmol/L 24 31 23   Calcium 8.9 - 10.3 mg/dL 8.4(L) 9.7 8.7(L)  Total Protein 6.5 - 8.1 g/dL 5.8(L) 8.3(H) 6.1(L)  Total Bilirubin 0.3 - 1.2 mg/dL 0.5 0.7 0.7  Alkaline Phos 38 - 126 U/L 48 65 47  AST 15 - 41 U/L 17 23 14(L)  ALT 0 - 44 U/L 14 20 12      Assessment/Plan Hx atrial fibrillation Hx ischemic cardiomyopathy/non-ST EMI/CABG 11/23/2017 Hx left MCA CVA Chronic anticoagulation -Xarelto last dose this AM 92/11)  Recurrent pancreatitis with stable cystic lesion in the pancreas favoring pseudocyst.  No evidence peripancreatic fluid or pancreatic necrosis. New finding of  choledocholithiasis in the proximal cystic duct on CT last p.m. not previously seen on CT or on ultrasound this AM. Bilateral nonobstructive nephrolithiasis 3.4 cm infrarenal AAA with irregular mural thrombus/ulcerated plaques  FEN:  NPO/IV fluids ID:  Zosyn 2/1 >> day 2 DVT:  Xarelto Follow up:  TBD  Plan:  I have talked to GI who has seen her last admission, and after reviewing studies we agree on getting and MRI.  She just had her Xarelto so if she has stones in the CBD I don't imagine anyone would do anything before Saturday 2/13. LFT's are unremarkable.   We will follow with you.  I would not go above clears for now.    Earnstine Regal [facility] Surgery 11/07/2019, 11:31 AM Please see Amion for pager number during day hours 7:00am-4:30pm

## 2019-11-07 NOTE — ED Provider Notes (Signed)
Patient signed out to me by Dr. Roderic Palau.  Patient with history of chronic choledocholithiasis and pancreatitis presents with sudden worsening of abdominal pain.  She has marked elevation of her lipase, greater than 10,000.  CT scan has been performed.  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  No evidence of elevated bilirubin or LFTs, but will empirically cover with Zosyn.  Patient will require hospitalization for further management.   Orpah Greek, MD 11/07/19 [phone]

## 2019-11-07 NOTE — Progress Notes (Addendum)
[facility]                                                                              Patient Demographics  [name], is a 59 y.o. female, DOB - 1961/06/22, WJX:[id number]  Admit date - 11/06/2019   Admitting Physician Thalia Party, DO  Outpatient Primary MD for the patient is Natalia Leatherwood, DO  Outpatient specialists:   LOS - 0  days   Medical records reviewed and are as summarized below:    Chief Complaint  Patient presents with  . Abdominal Pain       Brief summary   Patient is a 59 year old female with history of recurrent episodes of acute pancreatitis, atrial fibrillation, prior history of stroke, hypertension, hyperlipidemia presented with intractable epigastric abdominal pain, nausea and vomiting.  Patient reported however symptoms started soon after she had a bowl of soup and grilled cheese.   In ED, lipase level> 10,000, CT abdomen showed peripancreatic phlegmon or edema no evidence of pancreatic necrosis, persistent choledocholithiasis with calculi in proximal cystic duct, chronic pancreatic pseudocyst  Assessment & Plan    Principal Problem:   Acute recurrent pancreatitis -Per patient third episode, was admitted in 12/15 -09/13/2019 and 1/15- 10/14/2019 for acute pancreatitis. -CT abdomen pelvis showed interval worsening of peripancreatic phlegmon and edema, no necrosis.  Persistent choledocholithiasis with calculi and proximal cystic duct, worrisome for developing cholangitis. -Continue n.p.o., IV fluids, pain control -Continue IV Zosyn. -GI and general surgery consulted, recurrent and third episode of pancreatitis, likely needs ERCP/cholecystectomy.  Lipase trending down.  Patient was supposed to have GI follow-up appointment on 2/15 with Dr. Barron Alvine.  Active Problems:   Cerebrovascular accident (CVA) due to occlusion of left middle cerebral artery (HCC) -Continue Coreg, Xarelto    Hyperlipidemia LDL goal <70 -Hold off on  statins for now    Coronary artery disease, history of chronic diastolic CHF -Continue Coreg, Xarelto, currently no chest pain    Benign essential HTN -BP stable, resume Coreg to avoid reflex tachycardia  Incidental infrarenal abdominal aortic aneurysm -CT abdomen showed infrarenal abdominal aortic aneurysm measuring up to 3.4 cm in size with irregular mural thrombus and ulcerated plaque, recommended follow-up by ultrasound in 3 years.   Code Status: Full CODE STATUS DVT Prophylaxis: Xarelto Family Communication: Discussed all imaging results, lab results, explained to the patient's husband on phone   Disposition Plan: Patient from home, anticipated discharge to home once cleared from GI, surgery evaluation, pancreatitis resolved, tolerating solid diet.   Time Spent in minutes 35 minutes  Procedures:  CT abdomen and pelvis  Consultants:   Gastroenterology, [facility]  Antimicrobials:   Anti-infectives (From admission, onward)   Start     Dose/Rate Route Frequency Ordered Stop   11/07/19 0600  piperacillin-tazobactam (ZOSYN) IVPB 3.375 g     3.375 g 12.5 mL/hr over 240 Minutes Intravenous Every 8 hours 11/07/19 0214     11/07/19 0100  piperacillin-tazobactam (ZOSYN) IVPB 3.375 g  Status:  Discontinued     3.375 g 100 mL/hr over 30 Minutes Intravenous Every 6 hours 11/07/19 0049 11/07/19 2130  Medications  Scheduled Meds: . rivaroxaban  20 mg Oral Daily   Continuous Infusions: . sodium chloride 100 mL/hr at 11/07/19 0608  . piperacillin-tazobactam (ZOSYN)  IV 3.375 g (11/07/19 0609)   PRN Meds:.acetaminophen **OR** acetaminophen, morphine injection, ondansetron **OR** ondansetron (ZOFRAN) IV, ondansetron, traMADol      Subjective:   [name] was seen and examined today.  Still having diffuse abdominal pain, worse in epigastric region, 8/10.  No active vomiting, still has nausea.  No fevers or chills. Patient denies dizziness, chest  pain, shortness of breath, new weakness, numbess, tingling. No acute events overnight.    Objective:   Vitals:   11/07/19 0130 11/07/19 0200 11/07/19 0230 11/07/19 0306  BP: 104/74 119/81 138/78 (!) 144/91  Pulse: 81 88 81 88  Resp: 15 17 15    Temp:    (!) 97.5 F (36.4 C)  TempSrc:    Oral  SpO2: 99% 99% 100% 100%  Weight:      Height:        Intake/Output Summary (Last 24 hours) at 11/07/2019 1001 Last data filed at 11/07/2019 0300 Gross per 24 hour  Intake 1116.25 ml  Output --  Net 1116.25 ml     Wt Readings from Last 3 Encounters:  11/06/19 54 kg  10/30/19 52.6 kg  10/22/19 53.7 kg     Exam  General: Alert and oriented x 3, NAD  Cardiovascular: S1 S2 auscultated, no murmurs, RRR  Respiratory: Clear to auscultation bilaterally, no wheezing, rales or rhonchi  Gastrointestinal: Soft, mild diffuse TTP, worse in epigastric region, nondistended, + bowel sounds  Ext: no pedal edema bilaterally  Neuro: No new deficits  Musculoskeletal: No digital cyanosis, clubbing  Skin: No rashes  Psych: Normal affect and demeanor, alert and oriented x3    Data Reviewed:  I have personally reviewed following labs and imaging studies  Micro Results No results found for this or any previous visit (from the past 240 hour(s)).  Radiology Reports CT ABDOMEN PELVIS W CONTRAST  Addendum Date: 11/07/2019   ADDENDUM REPORT: 11/07/2019 00:39 ADDENDUM: Correction: Results were discussed with Dr. Blinda Leatherwood, not Dr. Estell Harpin Electronically Signed   By: Kreg Shropshire M.D.   On: 11/07/2019 00:39   Result Date: 11/07/2019 CLINICAL DATA:  Acute generalized abdominal pain with neutropenia, history of pancreatitis EXAM: CT ABDOMEN AND PELVIS WITH CONTRAST TECHNIQUE: Multidetector CT imaging of the abdomen and pelvis was performed using the standard protocol following bolus administration of intravenous contrast. CONTRAST:  80mL OMNIPAQUE IOHEXOL 300 MG/ML  SOLN COMPARISON:  CT 10/11/2019  FINDINGS: Lower chest: Atelectatic changes present in the lung bases. No consolidation or effusion. Normal heart size. No pericardial effusion. Atherosclerotic calcification of the coronary arteries. Hepatobiliary: No focal liver abnormality is seen. Mild dilatation and thickening of the proximal cystic duct to the level 2 small calculi proximal to the junction with the common bile duct (2/23). No other gross biliary ductal dilatation is seen. Pancreas: There is diffuse peripancreatic fat stranding most prominent about the head and uncinate process, to a lesser extent the pancreatic body which is overall increased from comparison study. Redemonstration of a hypoattenuating cystic structure in the pancreatic head which measures 1.7 by 1.8 cm, not significantly changed from comparison study. Slight prominence of the pancreatic duct seen on comparison exam is diminished on today's study. No gross ductal dilatation is seen. Remaining portions of the pancreatic parenchyma enhances normally without evidence of pancreatic necrosis. Inflammation appears predominantly phlegmonous without organized peripancreatic fluid collection. Spleen: Normal  in size without focal abnormality. Adrenals/Urinary Tract: Nodular thickening of the left adrenal gland is similar to prior. Right adrenal gland is normal. Scattered subcentimeter hypoattenuating foci throughout both kidneys too small to fully characterize on CT imaging but statistically likely benign. 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 Bilateral nonobstructive nephrolithiasis. No ureteral calculi are seen. Urinary bladder is largely decompressed at the time of exam and therefore poorly evaluated by CT imaging. Small cystocele. Stomach/Bowel: Persistent pre-pyloric hyperenhancement and mural thickening is likely secondary to the pancreatic inflammation but may result in delayed passage of ingested  material given the degree of stomach distention. No small bowel dilatation or wall thickening. A normal appendix is visualized. No colonic dilatation or wall thickening. Scattered colonic diverticula without focal pericolonic inflammation to suggest diverticulitis. Vascular/Lymphatic: Extensive atherosclerotic calcification of the abdominal aorta and branch vessels. There is redemonstration of a infrarenal abdominal aortic aneurysm measuring up to 3.4 cm in size with irregular mural thrombus and ulcerated plaque (2/38 and 4/63). There is some adjacent stranding in the retroperitoneum arising from the pancreas and about the duodenum but without focal periaortic stranding identified. Reactive adenopathy again seen in the upper abdomen. Reproductive: Venous engorgement the parametrial and gonadal veins. Retroverted uterus. No concerning adnexal lesions. Other: Peripancreatic phlegmon and edema, increased from prior. No free intraperitoneal fluid or air. No organized collections or abscess. Pelvic floor laxity, similar to prior with associated cyst is seal. No bowel containing hernias. Musculoskeletal: Multilevel degenerative changes are present in the imaged portions of the spine. No acute osseous abnormality or suspicious osseous lesion. IMPRESSION: 1. Interval worsening of peripancreatic phlegmon and edema. No organized peripancreatic fluid collection or evidence of pancreatic necrosis. Stable cystic lesion in the head of the pancreas, favoring pseudocyst. 2. Persistent choledocholithiasis with calculi in the proximal cystic duct. Increasing cystic ductal dilatation mural enhancement and inflammation worrisome for developing cholangitis. 3. Persistent pre-pyloric hyperenhancement and mural thickening of the stomach is likely secondary to the pancreatic inflammation but may result in delayed passage of ingested material given the degree of stomach distention. 4. Bilateral nonobstructive nephrolithiasis. 5. Infrarenal  abdominal aortic aneurysm measuring up to 3.4 cm in size with irregular mural thrombus and ulcerated plaque. Recommend followup by ultrasound in 3 years. This recommendation follows ACR consensus guidelines: White Paper of the ACR Incidental Findings Committee II on Vascular Findings. J Am Coll Radiol 2013; 10:789-794. Aortic aneurysm NOS (ICD10-I71.9) 6. Aortic Atherosclerosis (ICD10-I70.0). These results were called by telephone at the time of interpretation on 11/07/2019 at 12:32 am to provider [facility] ZAMMIT , who verbally acknowledged these results. Electronically Signed: By: Kreg Shropshire M.D. On: 11/07/2019 00:33   CT Abdomen Pelvis W Contrast  Result Date: 10/11/2019 CLINICAL DATA:  Abdominal distension. Elevated lipase. Referred by primary care physician for possible pancreatitis. Abdominal pain and nausea. EXAM: CT ABDOMEN AND PELVIS WITH CONTRAST TECHNIQUE: Multidetector CT imaging of the abdomen and pelvis was performed using the standard protocol following bolus administration of intravenous contrast. CONTRAST:  OMNIPAQUE IOHEXOL 300 MG/ML  SOLN COMPARISON:  CT 09/10/2019 FINDINGS: Lower chest: Minor hypoventilatory changes and left lower lobe scarring. No pleural effusion or acute airspace disease. Hepatobiliary: No focal liver abnormality is seen. No gallstones, gallbladder wall thickening, or biliary dilatation. Pancreas: Mild peripancreatic fat stranding is most prominent about the head and uncinate process, to a lesser extent pancreatic body. Cystic lesion within the pancreatic head measures 1.8 x 1.7  x 1.8 cm, previously 2.2 x 1.9 x 2.0 cm. No new peripancreatic fluid collection. Mild prominence of the pack small pancreatic duct at 4 mm. No pancreatic calcifications. Spleen: Normal in size without focal abnormality. The splenic vein is patent. Adrenals/Urinary Tract: Left adrenal thickening without dominant nodule. Right adrenal gland is normal. 6 mm stone in the right proximal ureter  without associated hydronephrosis, stone unchanged in position from prior exam. No perinephric edema. There is symmetric excretion on delayed phase imaging. Small low-density lesions within both kidneys are too small to characterize but likely cysts. Urinary bladder is partially distended. No bladder stone or wall thickening. Mild pelvic floor descent with small cystocele. Stomach/Bowel: Wall thickening of the pre pyloric stomach and proximal duodenum, may be in part reactive. Stomach is distended. Small bowel is otherwise decompressed. No obstruction. Normal appendix. Mild colonic diverticulosis without diverticulitis. Small volume of colonic stool. Vascular/Lymphatic: Infrarenal aortic aneurysm at 3.1 cm with mural thrombus and ulcerated plaque. No periaortic stranding. Moderate aortic atherosclerosis. The portal and splenic veins are patent. Mesenteric vessels are grossly patent. Dilatation of the ovarian veins with contrast refluxing, left greater than right. Left ovarian vein measures 7 mm. Small peripancreatic nodes, largest measuring 10 mm, likely reactive. Reproductive: Prominent periuterine and adnexal vascularity. Uterus otherwise quiescent. No suspicious adnexal mass. Other: Mild peripancreatic edema tracks in the central mesentery. No significant ascites. No free air. Musculoskeletal: There are no acute or suspicious osseous abnormalities. Mild degenerative change in the spine. IMPRESSION: 1. Acute pancreatitis. Cystic lesion in the pancreatic head measures 1.8 x 1.7 x 1.8 cm, previously 2.2 x 1.9 x 2.0 cm on CT 1 month ago, favoring pseudocyst with slight improvement. 2. Wall thickening of the pre pyloric stomach and proximal duodenum, may be in part reactive. 3. Infrarenal aortic aneurysm measuring 3.1 cm. Mural thrombus with ulcerated plaque, unchanged from recent prior exam. 4. Dilatation of the ovarian veins with contrast refluxing, left greater than right. This can be seen with pelvic congestion  syndrome in the appropriate clinical setting. 5. Right proximal ureteral calculus, unchanged in position from prior exam. This is likely nonobstructing, as there is no hydronephrosis in symmetric excretion on delayed phase imaging. 6. Colonic diverticulosis without diverticulitis. Aortic Atherosclerosis (ICD10-I70.0). Aortic aneurysm NOS (ICD10-I71.9). Electronically Signed   By: Narda Rutherford M.D.   On: 10/11/2019 20:37   CUP PACEART REMOTE DEVICE CHECK  Result Date: 10/21/2019 [facility] summary report received. Battery status OK. Normal device function. No new symptom episodes, tachy episodes, brady, or pause episodes. No new AF episodes. Monthly summary reports and ROV/PRN   Lab Data:  CBC: Recent Labs  Lab 11/06/19 2151 11/07/19 0437  WBC 17.5* 14.0*  HGB 17.5* 15.3*  HCT 53.9* 47.6*  MCV 100.6* 100.0  PLT 455* 348   Basic Metabolic Panel: Recent Labs  Lab 11/06/19 2151 11/07/19 0437  NA 141 138  K 3.5 4.0  CL 100 107  CO2 31 24  GLUCOSE 146* 122*  BUN 16 16  CREATININE 0.90 0.64  CALCIUM 9.7 8.4*   GFR: Estimated Creatinine Clearance: 57.1 mL/min (by C-G formula based on SCr of 0.64 mg/dL). Liver Function Tests: Recent Labs  Lab 11/06/19 2151 11/07/19 0437  AST 23 17  ALT 20 14  ALKPHOS 65 48  BILITOT 0.7 0.5  PROT 8.3* 5.8*  ALBUMIN 4.5 3.4*   Recent Labs  Lab 11/06/19 2151 11/07/19 0437  LIPASE >10,000* 837*   No results for input(s): AMMONIA in the last 168 hours. Coagulation  Profile: No results for input(s): INR, PROTIME in the last 168 hours. Cardiac Enzymes: No results for input(s): CKTOTAL, CKMB, CKMBINDEX, TROPONINI in the last 168 hours. BNP (last 3 results) No results for input(s): PROBNP in the last 8760 hours. HbA1C: No results for input(s): HGBA1C in the last 72 hours. CBG: Recent Labs  Lab 11/07/19 0800  GLUCAP 111*   Lipid Profile: No results for input(s): CHOL, HDL, LDLCALC, TRIG, CHOLHDL, LDLDIRECT in the last 72  hours. Thyroid Function Tests: No results for input(s): TSH, T4TOTAL, FREET4, T3FREE, THYROIDAB in the last 72 hours. Anemia Panel: No results for input(s): VITAMINB12, FOLATE, FERRITIN, TIBC, IRON, RETICCTPCT in the last 72 hours. Urine analysis:    Component Value Date/Time   COLORURINE YELLOW 11/06/2019 2151   APPEARANCEUR HAZY (A) 11/06/2019 2151   LABSPEC 1.019 11/06/2019 2151   PHURINE 6.0 11/06/2019 2151   GLUCOSEU NEGATIVE 11/06/2019 2151   HGBUR SMALL (A) 11/06/2019 2151   BILIRUBINUR NEGATIVE 11/06/2019 2151   BILIRUBINUR Negative 09/18/2019 1137   KETONESUR 20 (A) 11/06/2019 2151   PROTEINUR 100 (A) 11/06/2019 2151   UROBILINOGEN negative (A) 09/18/2019 1137   NITRITE NEGATIVE 11/06/2019 2151   LEUKOCYTESUR SMALL (A) 11/06/2019 2151     Remmie Bembenek M.D. [facility] 11/07/2019, 10:01 AM   Call night coverage person covering after 7pm

## 2019-11-08 DIAGNOSIS — K8500 Idiopathic acute pancreatitis without necrosis or infection: Secondary | ICD-10-CM

## 2019-11-08 DIAGNOSIS — K859 Acute pancreatitis without necrosis or infection, unspecified: Principal | ICD-10-CM

## 2019-11-08 DIAGNOSIS — R1010 Upper abdominal pain, unspecified: Secondary | ICD-10-CM

## 2019-11-08 LAB — CBC
HCT: 39.3 % (ref 36.0–46.0)
Hemoglobin: 12.3 g/dL (ref 12.0–15.0)
MCH: 32.6 pg (ref 26.0–34.0)
MCHC: 31.3 g/dL (ref 30.0–36.0)
MCV: 104.2 fL — ABNORMAL HIGH (ref 80.0–100.0)
Platelets: 273 10*3/uL (ref 150–400)
RBC: 3.77 MIL/uL — ABNORMAL LOW (ref 3.87–5.11)
RDW: 14.5 % (ref 11.5–15.5)
WBC: 9.3 10*3/uL (ref 4.0–10.5)
nRBC: 0 % (ref 0.0–0.2)

## 2019-11-08 LAB — COMPREHENSIVE METABOLIC PANEL
ALT: 11 U/L (ref 0–44)
AST: 14 U/L — ABNORMAL LOW (ref 15–41)
Albumin: 2.7 g/dL — ABNORMAL LOW (ref 3.5–5.0)
Alkaline Phosphatase: 39 U/L (ref 38–126)
Anion gap: 4 — ABNORMAL LOW (ref 5–15)
BUN: 16 mg/dL (ref 6–20)
CO2: 24 mmol/L (ref 22–32)
Calcium: 8 mg/dL — ABNORMAL LOW (ref 8.9–10.3)
Chloride: 107 mmol/L (ref 98–111)
Creatinine, Ser: 0.64 mg/dL (ref 0.44–1.00)
GFR calc Af Amer: >60 mL/min (ref >60)
GFR calc non Af Amer: >60 mL/min (ref >60)
Glucose, Bld: 82 mg/dL (ref 70–99)
Potassium: 3.7 mmol/L (ref 3.5–5.1)
Sodium: 135 mmol/L (ref 135–145)
Total Bilirubin: 0.7 mg/dL (ref 0.3–1.2)
Total Protein: 4.8 g/dL — ABNORMAL LOW (ref 6.5–8.1)

## 2019-11-08 LAB — LIPASE, BLOOD: Lipase: 299 U/L — ABNORMAL HIGH (ref 11–51)

## 2019-11-08 MED ORDER — RIVAROXABAN 20 MG PO TABS
20.0000 mg | ORAL_TABLET | Freq: Every day | ORAL | Status: DC
  Administered 2019-11-08: 20 mg via ORAL
  Filled 2019-11-08: qty 1

## 2019-11-08 MED ORDER — BOOST / RESOURCE BREEZE PO LIQD CUSTOM: 1.0000 | Freq: Three times a day (TID) | ORAL | Status: DC

## 2019-11-08 NOTE — Progress Notes (Signed)
[facility] Progress Note  Chief Complaint: Acute pancreatitis  History:  Her abdominal pain is decreasing.  She does not feel hungry, but is thirsty, and thinks she may be amenable to trying something by mouth.  Thus far she has only had water and ice chips. No fever, LFTs remain normal.  MRCP was done with results as noted below. She is frustrated by the recurrent episodes of pancreatitis without clear explanation. ROS: Cardiovascular: Denies chest pain Respiratory: Denies dyspnea Urinary: Denies dysuria  Objective:   Current Facility-Administered Medications:  .  0.9 %  sodium chloride infusion, , Intravenous, Continuous, Zehr, Laban Emperor, PA-C, Last Rate: 250 mL/hr at 11/07/19 1507, Rate Change at 11/07/19 1507 .  acetaminophen (TYLENOL) tablet 650 mg, 650 mg, Oral, Q6H PRN **OR** acetaminophen (TYLENOL) suppository 650 mg, 650 mg, Rectal, Q6H PRN, Bunnie Pion Z, DO .  carvedilol (COREG) tablet 3.125 mg, 3.125 mg, Oral, BID, Rai, Ripudeep K, MD, 3.125 mg at 11/07/19 1252 .  morphine 2 MG/ML injection 2 mg, 2 mg, Intravenous, Q4H PRN, Bunnie Pion Z, DO, 2 mg at 11/08/19 0315 .  ondansetron (ZOFRAN) tablet 4 mg, 4 mg, Oral, Q6H PRN **OR** ondansetron (ZOFRAN) injection 4 mg, 4 mg, Intravenous, Q6H PRN, Bunnie Pion Z, DO .  ondansetron (ZOFRAN-ODT) disintegrating tablet 4 mg, 4 mg, Oral, Once PRN, Milton Ferguson, MD .  piperacillin-tazobactam (ZOSYN) IVPB 3.375 g, 3.375 g, Intravenous, Q8H, Bunnie Pion Z, DO, Last Rate: 12.5 mL/hr at 11/08/19 0610, 3.375 g at 11/08/19 0610 .  rivaroxaban (XARELTO) tablet 20 mg, 20 mg, Oral, Daily, Rai, Ripudeep K, MD .  traMADol (ULTRAM) tablet 100 mg, 100 mg, Oral, Q6H PRN, Bunnie Pion Z, DO, 100 mg at 11/08/19 0608  . sodium chloride 250 mL/hr at 11/07/19 1507  . piperacillin-tazobactam (ZOSYN)  IV 3.375 g (11/08/19 0610)     Vital signs in last 24 hrs: Vitals:   11/07/19 2137 11/08/19 0548  BP: (!) 89/55 93/65  Pulse: 90 85   Resp: 16 15  Temp: 98.1 F (36.7 C) 98.2 F (36.8 C)  SpO2: (!) 88% 92%   No intake or output data in the 24 hours ending 11/08/19 1033   Physical Exam Fatigued, laying comfortably in bed, visibly frustrated  HEENT: sclera anicteric, oral mucosa without lesions  Neck: supple, no thyromegaly, JVD or lymphadenopathy  Cardiac: RRR without murmurs, S1S2 heard, no peripheral edema  Pulm: clear to auscultation bilaterally, normal RR and effort noted  Abdomen: soft, mild upper abdominal tenderness, with active bowel sounds. No guarding or palpable hepatosplenomegaly  Skin; warm and dry, no jaundice  Recent Labs:  CBC Latest Ref Rng & Units 11/08/2019 11/07/2019 11/06/2019  WBC 4.0 - 10.5 K/uL 9.3 14.0(H) 17.5(H)  Hemoglobin 12.0 - 15.0 g/dL 12.3 15.3(H) 17.5(H)  Hematocrit 36.0 - 46.0 % 39.3 47.6(H) 53.9(H)  Platelets 150 - 400 K/uL 273 348 455(H)    No results for input(s): INR in the last 168 hours. CMP Latest Ref Rng & Units 11/08/2019 11/07/2019 11/06/2019  Glucose 70 - 99 mg/dL 82 122(H) 146(H)  BUN 6 - 20 mg/dL 16 16 16   Creatinine 0.44 - 1.00 mg/dL 0.64 0.64 0.90  Sodium 135 - 145 mmol/L 135 138 141  Potassium 3.5 - 5.1 mmol/L 3.7 4.0 3.5  Chloride 98 - 111 mmol/L 107 107 100  CO2 22 - 32 mmol/L 24 24 31   Calcium 8.9 - 10.3 mg/dL 8.0(L) 8.4(L) 9.7  Total Protein 6.5 - 8.1 g/dL 4.8(L) 5.8(L) 8.3(H)  Total  Bilirubin 0.3 - 1.2 mg/dL 0.7 0.5 0.7  Alkaline Phos 38 - 126 U/L 39 48 65  AST 15 - 41 U/L 14(L) 17 23  ALT 0 - 44 U/L 11 14 20      Radiologic studies:  CLINICAL DATA:  Acute pancreatitis.   EXAM: MRI ABDOMEN WITHOUT AND WITH CONTRAST (INCLUDING MRCP)   TECHNIQUE: Multiplanar multisequence MR imaging of the abdomen was performed both before and after the administration of intravenous contrast. Heavily T2-weighted images of the biliary and pancreatic ducts were obtained, and three-dimensional MRCP images were rendered by post processing.   CONTRAST:  48mL  GADAVIST GADOBUTROL 1 MMOL/ML IV SOLN   COMPARISON:  11/07/2019   FINDINGS: Lower chest: Atelectasis identified within the lung bases.   Hepatobiliary: Hepatic steatosis. No focal liver abnormality. Mild gallbladder distention. No gallbladder wall inflammation noted. No gallstones identified. The previously noted calcified stones within the cystic duct are not confidently identified on today's exam although they may be obscured by motion artifact. No significant intrahepatic bile duct dilatation identified. The common bile duct appears focally ectatic proximal measuring up to 8 mm. No stones identified within the common bile duct.   Pancreas: No main duct dilatation identified. Pancreatic edema with surrounding inflammation and free fluid is again noted compatible with acute pancreatitis. Within the head of pancreas there is again seen a 1.8 x 1.3 cm heterogeneous, mixed signal intensity structure favoring pseudo cyst, image 24/4. Previously this measured 1.8 x 1.9 cm. There are no mature fluid collections identified within the upper abdomen to suggest additional pseudocysts.   Spleen:  Within normal limits in size and appearance.   Adrenals/Urinary Tract: Normal appearance of the adrenal glands. No hydronephrosis. Multiple bilateral too small to characterize T2 hyperintense kidney lesions are again noted.   Stomach/Bowel: Persistent distension of the gastric lumen. Pre-pyloric hyperenhancement and mural thickening is similar to previous exam and is felt to be likely secondary to pancreatic inflammation. No abnormal bowel wall thickening, inflammation or distension noted.   Vascular/Lymphatic: Aortic atherosclerosis. Infrarenal abdominal aortic aneurysm is again identified, better characterized on CT from 11/06/2019. No adenopathy.   Other: Peripancreatic phlegmon and edema is identified. This appears similar to 11/06/2019. No extra pancreatic organized fluid collections  identified.   Musculoskeletal: No suspicious bone lesions identified.   IMPRESSION: 1. Redemonstration of acute pancreatitis. No organized peripancreatic fluid collections are identified. 2. The previously noted calcified stones within the cystic duct are not confidently identified on today's exam although they may be obscured by motion artifact. No significant intrahepatic or common bile duct dilatation identified. No stones identified within the common bile duct. 3. Persistent distension of the gastric lumen with pre-pyloric hyperenhancement and mural thickened mural thickeningfavored to be secondary to pancreatic inflammation. 4. Infrarenal abdominal aortic aneurysm, better characterized on recent CT from 02/10/201.     Electronically Signed   By: Kerby Moors M.D.   On: 11/07/2019 15:19  @ASSESSMENTPLANBEGIN @ Assessment: Recurrent acute pancreatitis of unclear cause.  IgG4 negative suggesting less likely to be autoimmune pancreatitis.  LFTs have remained normal with these few hospitalizations for pancreatitis.  Nevertheless, recurrent microlithiasis should be considered and is common. CT scan initially suggested perhaps small stones in the cystic duct, but none seen on MRCP, accounting for some decrease sensitivity for motion artifact. CBD noted at 8 mm, so not highly suggestive of high-grade outflow obstruction like papillary stenosis.  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  I will cautiously try to advance diet  today and see how she does. I would appreciate the opinion of our surgical colleagues on whether they might consider cholecystectomy at some point if no other clear cause of the pancreatitis can be discovered.  I will also discuss with my biliary colleagues the potential role of EUS plus minus ERCP before proceeding with cholecystectomy.  Plan: Clear liquid diet now with boost breeze supplements. If tolerated well without increased  abdominal pain, without nausea and vomiting, then advance to full liquid diet with supper tonight. CMP and lipase level tomorrow  Total time 25 minutes Nelida Meuse III Office: [phone]

## 2019-11-08 NOTE — Progress Notes (Signed)
QT:[id number] pain, nausea and vomiting  Subjective: Pain is improving.  She is hungry and has not eaten for couple days.  Sore from being in bed for prolonged period.  No further nausea or vomiting.  Objective: Vital signs in last 24 hours: Temp:  [98.1 F (36.7 C)-98.7 F (37.1 C)] 98.2 F (36.8 C) (02/12 0548) Pulse Rate:  [85-90] 85 (02/12 0548) Resp:  [15-19] 15 (02/12 0548) BP: (89-109)/(55-71) 93/65 (02/12 0548) SpO2:  [88 %-94 %] 92 % (02/12 0548)   No I/O recorded Afebrile vital signs are stable blood pressure was low last evening. WBC 9.3 H/H 12.3/39.3 Lipase 299 CMP Latest Ref Rng & Units 11/08/2019 11/07/2019 11/06/2019  Total Bilirubin 0.3 - 1.2 mg/dL 0.7 0.5 0.7  Alkaline Phos 38 - 126 U/L 39 48 65  AST 15 - 41 U/L 14(L) 17 23  ALT 0 - 44 U/L 11 14 20    Intake/Output from previous day: No intake/output data recorded. Intake/Output this shift: No intake/output data recorded.  General appearance: alert, cooperative and no distress GI: Soft, minimal generalized tenderness.  No acute discomfort.  Positive bowel sounds  Lab Results:  Recent Labs    11/07/19 0437 11/08/19 0428  WBC 14.0* 9.3  HGB 15.3* 12.3  HCT 47.6* 39.3  PLT 348 273    BMET Recent Labs    11/07/19 0437 11/08/19 0428  NA 138 135  K 4.0 3.7  CL 107 107  CO2 24 24  GLUCOSE 122* 82  BUN 16 16  CREATININE 0.64 0.64  CALCIUM 8.4* 8.0*   PT/INR No results for input(s): LABPROT, INR in the last 72 hours.  Recent Labs  Lab 11/06/19 2151 11/07/19 0437 11/08/19 0428  AST 23 17 14*  ALT 20 14 11   ALKPHOS 65 48 39  BILITOT 0.7 0.5 0.7  PROT 8.3* 5.8* 4.8*  ALBUMIN 4.5 3.4* 2.7*     Lipase     Component Value Date/Time   LIPASE 299 (H) 11/08/2019 0428     Medications: . carvedilol  3.125 mg Oral BID    Assessment/Plan Hx atrial fibrillation Hx ischemic cardiomyopathy/non-ST EMI/CABG 11/23/2017 Hx left MCA CVA Chronic anticoagulation -Xarelto last dose this AM  92/11) Bilateral nonobstructive nephrolithiasis 3.4 cm infrarenal AAA   Recurrent pancreatitis with stable cystic lesion in the pancreas favoring pseudocyst.  No evidence peripancreatic fluid or pancreatic necrosis. New finding of choledocholithiasis in the proximal cystic duct on CT last p.m. not previously seen on CT or on ultrasound this AM.  - MRCP 2/11:Redemonstration of acute pancreatitis. No organized peripancreatic fluid collections        are identified.      Hepatic steatosis, mild gallbladder distention, no gallbladder wall inflammation no                      gallstones identified.  Previously noted calcified stones within the cystic duct are not  confidently        Identified.  No significant intrahepatic bile duct dilatation identified.  CBD 8  mm no stones identified      LFTs remain normal      Lipase >10,000>>837>>299  FEN:  NPO/IV fluids ID:  Zosyn 2/10  >> day 2 DVT:  Xarelto Follow up:  TBD  Plan: MRCP and right upper quadrant ultrasound do not show gallstones or choledocholithiasis.  CBD measured 5 mm on abdominal ultrasound, and measured 8 mm on MRCP.  I can not explain that discrepancy.  LFTs remain  normal.  At this point I see no indication for cholecystectomy.  Please call if we can be of further assistance.     LOS: 1 day    Malaiya Paczkowski 11/08/2019 Please see Amion

## 2019-11-08 NOTE — Progress Notes (Signed)
[facility]                                                                              Patient Demographics  [name], is a 59 y.o. female, DOB - [birth year], YQM:[id number]  Admit date - 11/06/2019   Admitting Physician Thalia Party, DO  Outpatient Primary MD for the patient is Natalia Leatherwood, DO  Outpatient specialists:   LOS - 1  days   Medical records reviewed and are as summarized below:    Chief Complaint  Patient presents with  . Abdominal Pain       Brief summary   Patient is a 59 year old female with history of recurrent episodes of acute pancreatitis, atrial fibrillation, prior history of stroke, hypertension, hyperlipidemia presented with intractable epigastric abdominal pain, nausea and vomiting.  Patient reported however symptoms started soon after she had a bowl of soup and grilled cheese.   In ED, lipase level> 10,000, CT abdomen showed peripancreatic phlegmon or edema no evidence of pancreatic necrosis, persistent choledocholithiasis with calculi in proximal cystic duct, chronic pancreatic pseudocyst  Assessment & Plan    Principal Problem:   Acute recurrent pancreatitis: Unclear etiology -Per patient third episode, was admitted in 12/15 -09/13/2019 and 1/15- 10/14/2019 for acute pancreatitis. -CT abdomen pelvis showed interval worsening of peripancreatic phlegmon and edema, no necrosis.  Persistent choledocholithiasis with calculi and proximal cystic duct, worrisome for developing cholangitis. -Continue Zosyn -  Lipase now trending down, > 10,000--> 837-> 299 today -Started on clears this morning, advance to full liquids if tolerated -MRCP showed acute pancreatitis, no organized peripancreatic fluid collections, previously noted calcified stones in the cystic duct, not identified, no stones in the common bile duct.  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   Code Status: Full CODE STATUS DVT Prophylaxis: Xarelto Family Communication: Discussed all imaging results, lab results, explained to the patient's husband   Disposition Plan: Patient from home, anticipated discharge to home once cleared from GI, surgery evaluation, pancreatitis resolved, tolerating solid diet.   Time Spent in minutes 35 minutes  Procedures:  CT abdomen and pelvis  Consultants:   Gastroenterology, [facility]  Antimicrobials:   Anti-infectives (From admission, onward)   Start     Dose/Rate Route Frequency Ordered Stop   11/07/19 0600  piperacillin-tazobactam (ZOSYN) IVPB 3.375 g     3.375 g 12.5 mL/hr over 240 Minutes Intravenous Every 8 hours 11/07/19 0214     11/07/19 0100  piperacillin-tazobactam (ZOSYN) IVPB 3.375 g  Status:  Discontinued     3.375 g 100 mL/hr over 30 Minutes Intravenous Every 6 hours 11/07/19 0049 11/07/19 0213         Medications  Scheduled Meds: . carvedilol  3.125 mg Oral BID  . feeding supplement  1 Container Oral TID BM  . rivaroxaban  20 mg Oral Daily   Continuous Infusions: . sodium chloride 250 mL/hr at 11/07/19 1507  . piperacillin-tazobactam (ZOSYN)  IV 3.375 g (11/08/19 0610)   PRN Meds:.acetaminophen **OR** acetaminophen, morphine injection, ondansetron **OR** ondansetron (ZOFRAN) IV,  ondansetron, traMADol      Subjective:   [name] was seen and examined today.  Abdominal pain is improving, 5/10, no active nausea or vomiting.  No fevers or chills.  Patient denies dizziness, chest pain, shortness of breath, new weakness, numbess, tingling. No acute events overnight.    Objective:   Vitals:   11/07/19 2137 11/08/19 0548 11/08/19 1000 11/08/19 1423  BP: (!) 89/55 93/65 108/67 129/80  Pulse: 90 85  81  Resp: 16 15  16   Temp: 98.1 F (36.7 C) 98.2 F (36.8 C)  97.7 F (36.5 C)  TempSrc: Oral Oral  Oral  SpO2: (!) 88% 92%  93%  Weight:      Height:       No intake or output data in the 24 hours ending 11/08/19 1445   Wt Readings from Last 3 Encounters:  11/06/19 54 kg  10/30/19 52.6 kg  10/22/19 53.7 kg   Physical Exam  General: Alert and oriented x 3, NAD  Cardiovascular: S1 S2 clear, RRR. No pedal edema b/l  Respiratory: CTAB, no wheezing, rales or rhonchi  Gastrointestinal: Soft, mild upper abdominal TTP , nondistended, NBS  Ext: no pedal edema bilaterally  Neuro: no new deficits  Musculoskeletal: No cyanosis, clubbing  Skin: No rashes  Psych: Normal affect and demeanor, alert and oriented x3     Exam  General: Alert and oriented x 3, NAD  Cardiovascular: S1 S2 auscultated, no murmurs, RRR  Respiratory: Clear to auscultation bilaterally, no wheezing, rales or rhonchi  Gastrointestinal: Soft, mild diffuse TTP, worse in epigastric region, nondistended, + bowel sounds  Ext: no pedal edema bilaterally  Neuro: No new deficits  Musculoskeletal: No digital cyanosis, clubbing  Skin: No rashes  Psych: Normal affect and demeanor, alert and oriented x3    Data Reviewed:  I have personally reviewed following labs and imaging studies  Micro Results Recent Results (from the past 240 hour(s))  Culture, blood (Routine X 2) w Reflex to ID Panel     Status: None (Preliminary result)   Collection Time: 11/07/19  1:13 AM    Specimen: BLOOD  Result Value Ref Range Status   Specimen Description   Final    BLOOD BLOOD RIGHT HAND Performed at [facility], [address] [place], [place] [address]    Special Requests   Final    BOTTLES DRAWN AEROBIC ONLY Blood Culture results may not be optimal due to an inadequate volume of blood received in culture bottles Performed at [facility], [address], [place], [place] [address]    Culture   Final    NO GROWTH 1 DAY Performed at [facility], [address] [place], [place] [address]    Report Status PENDING  Incomplete  Culture, blood (Routine X 2) w Reflex to ID Panel     Status: None (Preliminary result)   Collection Time: 11/07/19  1:13 AM  Specimen: BLOOD  Result Value Ref Range Status   Specimen Description   Final    BLOOD BLOOD LEFT HAND Performed at [facility], [address], [place], [place] [address]    Special Requests   Final    BOTTLES DRAWN AEROBIC AND ANAEROBIC Blood Culture adequate volume Performed at [facility], [address], [place], [place] [address]    Culture   Final    NO GROWTH 1 DAY Performed at [facility], [address] [place], [place] [address]    Report Status PENDING  Incomplete  SARS CORONAVIRUS 2 (TAT 6-24 HRS) Nasopharyngeal Nasopharyngeal Swab     Status: None   Collection Time: 11/07/19  2:40 AM   Specimen: Nasopharyngeal Swab  Result Value Ref Range Status   SARS Coronavirus 2 NEGATIVE NEGATIVE Final    Comment: (NOTE) SARS-CoV-2 target nucleic acids are NOT DETECTED. The SARS-CoV-2 RNA is generally detectable in upper and lower respiratory specimens during the acute phase of infection. Negative results do not preclude SARS-CoV-2 infection, do not rule out co-infections with other pathogens, and should not be used as the sole basis for treatment or other patient management decisions. Negative results must be  combined with clinical observations, patient history, and epidemiological information. The expected result is Negative. Fact Sheet for Patients: HairSlick.no Fact Sheet for Healthcare Providers: quierodirigir.com This test is not yet approved or cleared by the [place] FDA and  has been authorized for detection and/or diagnosis of SARS-CoV-2 by FDA under an Emergency Use Authorization (EUA). This EUA will remain  in effect (meaning this test can be used) for the duration of the COVID-19 declaration under Section 56 4(b)(1) of the Act, 21 U.S.C. section 360bbb-3(b)(1), unless the authorization is terminated or revoked sooner. Performed at [facility], [address] [place], [place] [address]     Radiology Reports CT ABDOMEN PELVIS W CONTRAST  Addendum Date: 11/07/2019   ADDENDUM REPORT: 11/07/2019 00:39 ADDENDUM: Correction: Results were discussed with Dr. Blinda Leatherwood, not Dr. Estell Harpin Electronically Signed   By: Kreg Shropshire M.D.   On: 11/07/2019 00:39   Result Date: 11/07/2019 CLINICAL DATA:  Acute generalized abdominal pain with neutropenia, history of pancreatitis EXAM: CT ABDOMEN AND PELVIS WITH CONTRAST TECHNIQUE: Multidetector CT imaging of the abdomen and pelvis was performed using the standard protocol following bolus administration of intravenous contrast. CONTRAST:  80mL OMNIPAQUE IOHEXOL 300 MG/ML  SOLN COMPARISON:  CT 10/11/2019 FINDINGS: Lower chest: Atelectatic changes present in the lung bases. No consolidation or effusion. Normal heart size. No pericardial effusion. Atherosclerotic calcification of the coronary arteries. Hepatobiliary: No focal liver abnormality is seen. Mild dilatation and thickening of the proximal cystic duct to the level 2 small calculi proximal to the junction with the common bile duct (2/23). No other gross biliary ductal dilatation is seen. Pancreas: There is diffuse peripancreatic fat  stranding most prominent about the head and uncinate process, to a lesser extent the pancreatic body which is overall increased from comparison study. Redemonstration of a hypoattenuating cystic structure in the pancreatic head which measures 1.7 by 1.8 cm, not significantly changed from comparison study. Slight prominence of the pancreatic duct seen on comparison exam is diminished on today's study. No gross ductal dilatation is seen. Remaining portions of the pancreatic parenchyma enhances normally without evidence of pancreatic necrosis. Inflammation appears predominantly phlegmonous without organized peripancreatic fluid collection. Spleen: Normal in size without focal abnormality. Adrenals/Urinary Tract: Nodular thickening of the left  adrenal gland is similar to prior. Right adrenal gland is normal. Scattered subcentimeter hypoattenuating foci throughout both kidneys too small to fully characterize on CT imaging but statistically likely benign. 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 Bilateral nonobstructive nephrolithiasis. No ureteral calculi are seen. Urinary bladder is largely decompressed at the time of exam and therefore poorly evaluated by CT imaging. Small cystocele. Stomach/Bowel: Persistent pre-pyloric hyperenhancement and mural thickening is likely secondary to the pancreatic inflammation but may result in delayed passage of ingested material given the degree of stomach distention. No small bowel dilatation or wall thickening. A normal appendix is visualized. No colonic dilatation or wall thickening. Scattered colonic diverticula without focal pericolonic inflammation to suggest diverticulitis. Vascular/Lymphatic: Extensive atherosclerotic calcification of the abdominal aorta and branch vessels. There is redemonstration of a infrarenal abdominal aortic aneurysm measuring up to 3.4 cm in size with irregular mural thrombus  and ulcerated plaque (2/38 and 4/63). There is some adjacent stranding in the retroperitoneum arising from the pancreas and about the duodenum but without focal periaortic stranding identified. Reactive adenopathy again seen in the upper abdomen. Reproductive: Venous engorgement the parametrial and gonadal veins. Retroverted uterus. No concerning adnexal lesions. Other: Peripancreatic phlegmon and edema, increased from prior. No free intraperitoneal fluid or air. No organized collections or abscess. Pelvic floor laxity, similar to prior with associated cyst is seal. No bowel containing hernias. Musculoskeletal: Multilevel degenerative changes are present in the imaged portions of the spine. No acute osseous abnormality or suspicious osseous lesion. IMPRESSION: 1. Interval worsening of peripancreatic phlegmon and edema. No organized peripancreatic fluid collection or evidence of pancreatic necrosis. Stable cystic lesion in the head of the pancreas, favoring pseudocyst. 2. Persistent choledocholithiasis with calculi in the proximal cystic duct. Increasing cystic ductal dilatation mural enhancement and inflammation worrisome for developing cholangitis. 3. Persistent pre-pyloric hyperenhancement and mural thickening of the stomach is likely secondary to the pancreatic inflammation but may result in delayed passage of ingested material given the degree of stomach distention. 4. Bilateral nonobstructive nephrolithiasis. 5. Infrarenal abdominal aortic aneurysm measuring up to 3.4 cm in size with irregular mural thrombus and ulcerated plaque. Recommend followup by ultrasound in 3 years. This recommendation follows ACR consensus guidelines: White Paper of the ACR Incidental Findings Committee II on Vascular Findings. J Am Coll Radiol 2013; 10:789-794. Aortic aneurysm NOS (ICD10-I71.9) 6. Aortic Atherosclerosis (ICD10-I70.0). These results were called by telephone at the time of interpretation on 11/07/2019 at 12:32 am to  provider [facility] ZAMMIT , who verbally acknowledged these results. Electronically Signed: By: Kreg Shropshire M.D. On: 11/07/2019 00:33   CT Abdomen Pelvis W Contrast  Result Date: 10/11/2019 CLINICAL DATA:  Abdominal distension. Elevated lipase. Referred by primary care physician for possible pancreatitis. Abdominal pain and nausea. EXAM: CT ABDOMEN AND PELVIS WITH CONTRAST TECHNIQUE: Multidetector CT imaging of the abdomen and pelvis was performed using the standard protocol following bolus administration of intravenous contrast. CONTRAST:  OMNIPAQUE IOHEXOL 300 MG/ML  SOLN COMPARISON:  CT 09/10/2019 FINDINGS: Lower chest: Minor hypoventilatory changes and left lower lobe scarring. No pleural effusion or acute airspace disease. Hepatobiliary: No focal liver abnormality is seen. No gallstones, gallbladder wall thickening, or biliary dilatation. Pancreas: Mild peripancreatic fat stranding is most prominent about the head and uncinate process, to a lesser extent pancreatic body. Cystic lesion within the pancreatic head measures 1.8 x 1.7 x 1.8 cm, previously 2.2 x 1.9 x 2.0 cm. No new  peripancreatic fluid collection. Mild prominence of the pack small pancreatic duct at 4 mm. No pancreatic calcifications. Spleen: Normal in size without focal abnormality. The splenic vein is patent. Adrenals/Urinary Tract: Left adrenal thickening without dominant nodule. Right adrenal gland is normal. 6 mm stone in the right proximal ureter without associated hydronephrosis, stone unchanged in position from prior exam. No perinephric edema. There is symmetric excretion on delayed phase imaging. Small low-density lesions within both kidneys are too small to characterize but likely cysts. Urinary bladder is partially distended. No bladder stone or wall thickening. Mild pelvic floor descent with small cystocele. Stomach/Bowel: Wall thickening of the pre pyloric stomach and proximal duodenum, may be in part reactive. Stomach is  distended. Small bowel is otherwise decompressed. No obstruction. Normal appendix. Mild colonic diverticulosis without diverticulitis. Small volume of colonic stool. Vascular/Lymphatic: Infrarenal aortic aneurysm at 3.1 cm with mural thrombus and ulcerated plaque. No periaortic stranding. Moderate aortic atherosclerosis. The portal and splenic veins are patent. Mesenteric vessels are grossly patent. Dilatation of the ovarian veins with contrast refluxing, left greater than right. Left ovarian vein measures 7 mm. Small peripancreatic nodes, largest measuring 10 mm, likely reactive. Reproductive: Prominent periuterine and adnexal vascularity. Uterus otherwise quiescent. No suspicious adnexal mass. Other: Mild peripancreatic edema tracks in the central mesentery. No significant ascites. No free air. Musculoskeletal: There are no acute or suspicious osseous abnormalities. Mild degenerative change in the spine. IMPRESSION: 1. Acute pancreatitis. Cystic lesion in the pancreatic head measures 1.8 x 1.7 x 1.8 cm, previously 2.2 x 1.9 x 2.0 cm on CT 1 month ago, favoring pseudocyst with slight improvement. 2. Wall thickening of the pre pyloric stomach and proximal duodenum, may be in part reactive. 3. Infrarenal aortic aneurysm measuring 3.1 cm. Mural thrombus with ulcerated plaque, unchanged from recent prior exam. 4. Dilatation of the ovarian veins with contrast refluxing, left greater than right. This can be seen with pelvic congestion syndrome in the appropriate clinical setting. 5. Right proximal ureteral calculus, unchanged in position from prior exam. This is likely nonobstructing, as there is no hydronephrosis in symmetric excretion on delayed phase imaging. 6. Colonic diverticulosis without diverticulitis. Aortic Atherosclerosis (ICD10-I70.0). Aortic aneurysm NOS (ICD10-I71.9). Electronically Signed   By: Narda Rutherford M.D.   On: 10/11/2019 20:37   MR 3D Recon At Scanner  Result Date: 11/07/2019 CLINICAL  DATA:  Acute pancreatitis. EXAM: MRI ABDOMEN WITHOUT AND WITH CONTRAST (INCLUDING MRCP) TECHNIQUE: Multiplanar multisequence MR imaging of the abdomen was performed both before and after the administration of intravenous contrast. Heavily T2-weighted images of the biliary and pancreatic ducts were obtained, and three-dimensional MRCP images were rendered by post processing. CONTRAST:  6mL GADAVIST GADOBUTROL 1 MMOL/ML IV SOLN COMPARISON:  11/07/2019 FINDINGS: Lower chest: Atelectasis identified within the lung bases. Hepatobiliary: Hepatic steatosis. No focal liver abnormality. Mild gallbladder distention. No gallbladder wall inflammation noted. No gallstones identified. The previously noted calcified stones within the cystic duct are not confidently identified on today's exam although they may be obscured by motion artifact. No significant intrahepatic bile duct dilatation identified. The common bile duct appears focally ectatic proximal measuring up to 8 mm. No stones identified within the common bile duct. Pancreas: No main duct dilatation identified. Pancreatic edema with surrounding inflammation and free fluid is again noted compatible with acute pancreatitis. Within the head of pancreas there is again seen a 1.8 x 1.3 cm heterogeneous, mixed signal intensity structure favoring pseudo cyst, image 24/4. Previously this measured 1.8 x 1.9 cm. There are no  mature fluid collections identified within the upper abdomen to suggest additional pseudocysts. Spleen:  Within normal limits in size and appearance. Adrenals/Urinary Tract: Normal appearance of the adrenal glands. No hydronephrosis. Multiple bilateral too small to characterize T2 hyperintense kidney lesions are again noted. Stomach/Bowel: Persistent distension of the gastric lumen. Pre-pyloric hyperenhancement and mural thickening is similar to previous exam and is felt to be likely secondary to pancreatic inflammation. No abnormal bowel wall thickening,  inflammation or distension noted. Vascular/Lymphatic: Aortic atherosclerosis. Infrarenal abdominal aortic aneurysm is again identified, better characterized on CT from 11/06/2019. No adenopathy. Other: Peripancreatic phlegmon and edema is identified. This appears similar to 11/06/2019. No extra pancreatic organized fluid collections identified. Musculoskeletal: No suspicious bone lesions identified. IMPRESSION: 1. Redemonstration of acute pancreatitis. No organized peripancreatic fluid collections are identified. 2. The previously noted calcified stones within the cystic duct are not confidently identified on today's exam although they may be obscured by motion artifact. No significant intrahepatic or common bile duct dilatation identified. No stones identified within the common bile duct. 3. Persistent distension of the gastric lumen with pre-pyloric hyperenhancement and mural thickened mural thickeningfavored to be secondary to pancreatic inflammation. 4. Infrarenal abdominal aortic aneurysm, better characterized on recent CT from 02/10/201. Electronically Signed   By: Signa Kell M.D.   On: 11/07/2019 15:19   MR ABDOMEN MRCP W WO CONTAST  Result Date: 11/07/2019 CLINICAL DATA:  Acute pancreatitis. EXAM: MRI ABDOMEN WITHOUT AND WITH CONTRAST (INCLUDING MRCP) TECHNIQUE: Multiplanar multisequence MR imaging of the abdomen was performed both before and after the administration of intravenous contrast. Heavily T2-weighted images of the biliary and pancreatic ducts were obtained, and three-dimensional MRCP images were rendered by post processing. CONTRAST:  6mL GADAVIST GADOBUTROL 1 MMOL/ML IV SOLN COMPARISON:  11/07/2019 FINDINGS: Lower chest: Atelectasis identified within the lung bases. Hepatobiliary: Hepatic steatosis. No focal liver abnormality. Mild gallbladder distention. No gallbladder wall inflammation noted. No gallstones identified. The previously noted calcified stones within the cystic duct are not  confidently identified on today's exam although they may be obscured by motion artifact. No significant intrahepatic bile duct dilatation identified. The common bile duct appears focally ectatic proximal measuring up to 8 mm. No stones identified within the common bile duct. Pancreas: No main duct dilatation identified. Pancreatic edema with surrounding inflammation and free fluid is again noted compatible with acute pancreatitis. Within the head of pancreas there is again seen a 1.8 x 1.3 cm heterogeneous, mixed signal intensity structure favoring pseudo cyst, image 24/4. Previously this measured 1.8 x 1.9 cm. There are no mature fluid collections identified within the upper abdomen to suggest additional pseudocysts. Spleen:  Within normal limits in size and appearance. Adrenals/Urinary Tract: Normal appearance of the adrenal glands. No hydronephrosis. Multiple bilateral too small to characterize T2 hyperintense kidney lesions are again noted. Stomach/Bowel: Persistent distension of the gastric lumen. Pre-pyloric hyperenhancement and mural thickening is similar to previous exam and is felt to be likely secondary to pancreatic inflammation. No abnormal bowel wall thickening, inflammation or distension noted. Vascular/Lymphatic: Aortic atherosclerosis. Infrarenal abdominal aortic aneurysm is again identified, better characterized on CT from 11/06/2019. No adenopathy. Other: Peripancreatic phlegmon and edema is identified. This appears similar to 11/06/2019. No extra pancreatic organized fluid collections identified. Musculoskeletal: No suspicious bone lesions identified. IMPRESSION: 1. Redemonstration of acute pancreatitis. No organized peripancreatic fluid collections are identified. 2. The previously noted calcified stones within the cystic duct are not confidently identified on today's exam although they may be obscured by motion artifact. No  significant intrahepatic or common bile duct dilatation identified. No  stones identified within the common bile duct. 3. Persistent distension of the gastric lumen with pre-pyloric hyperenhancement and mural thickened mural thickeningfavored to be secondary to pancreatic inflammation. 4. Infrarenal abdominal aortic aneurysm, better characterized on recent CT from 02/10/201. Electronically Signed   By: Signa Kell M.D.   On: 11/07/2019 15:19   CUP PACEART REMOTE DEVICE CHECK  Result Date: 10/21/2019 [facility] summary report received. Battery status OK. Normal device function. No new symptom episodes, tachy episodes, brady, or pause episodes. No new AF episodes. Monthly summary reports and ROV/PRN  US Abdomen Limited RUQ  Result Date: 11/07/2019 CLINICAL DATA:  Pancreatitis.  Cystic pancreatic mass on CT. EXAM: ULTRASOUND ABDOMEN LIMITED RIGHT UPPER QUADRANT COMPARISON:  CT, 11/06/2019 FINDINGS: Gallbladder: No gallstones or wall thickening visualized. No sonographic Murphy sign noted by sonographer. Common bile duct: Diameter: 5 mm Liver: Normal echogenicity. Normal size. No mass or focal lesion. Portal vein is patent on color Doppler imaging with normal direction of blood flow towards the liver. Other: In the pancreatic head, there is a hypoechoic area measuring 2.9 x 2.6 x 3.0 cm, which corresponds to the cystic appearing mass noted on the previous day's CT. IMPRESSION: 1. No gallstones or bile duct dilation. No sonographic liver abnormality. 2. Hypoechoic pancreatic head lesion consistent with the cystic mass noted on the previous day's CT. Electronically Signed   By: Amie Portland M.D.   On: 11/07/2019 10:06    Lab Data:  CBC: Recent Labs  Lab 11/06/19 2151 11/07/19 0437 11/08/19 0428  WBC 17.5* 14.0* 9.3  HGB 17.5* 15.3* 12.3  HCT 53.9* 47.6* 39.3  MCV 100.6* 100.0 104.2*  PLT 455* 348 273   Basic Metabolic Panel: Recent Labs  Lab 11/06/19 2151 11/07/19 0437 11/08/19 0428  NA 141 138 135  K 3.5 4.0 3.7  CL 100 107 107  CO2 31 24 24   GLUCOSE 146*  122* 82  BUN 16 16 16   CREATININE 0.90 0.64 0.64  CALCIUM 9.7 8.4* 8.0*   GFR: Estimated Creatinine Clearance: 57.1 mL/min (by C-G formula based on SCr of 0.64 mg/dL). Liver Function Tests: Recent Labs  Lab 11/06/19 2151 11/07/19 0437 11/08/19 0428  AST 23 17 14*  ALT 20 14 11   ALKPHOS 65 48 39  BILITOT 0.7 0.5 0.7  PROT 8.3* 5.8* 4.8*  ALBUMIN 4.5 3.4* 2.7*   Recent Labs  Lab 11/06/19 2151 11/07/19 0437 11/08/19 0428  LIPASE >10,000* 837* 299*   No results for input(s): AMMONIA in the last 168 hours. Coagulation Profile: No results for input(s): INR, PROTIME in the last 168 hours. Cardiac Enzymes: No results for input(s): CKTOTAL, CKMB, CKMBINDEX, TROPONINI in the last 168 hours. BNP (last 3 results) No results for input(s): PROBNP in the last 8760 hours. HbA1C: No results for input(s): HGBA1C in the last 72 hours. CBG: Recent Labs  Lab 11/07/19 0800  GLUCAP 111*   Lipid Profile: No results for input(s): CHOL, HDL, LDLCALC, TRIG, CHOLHDL, LDLDIRECT in the last 72 hours. Thyroid Function Tests: No results for input(s): TSH, T4TOTAL, FREET4, T3FREE, THYROIDAB in the last 72 hours. Anemia Panel: No results for input(s): VITAMINB12, FOLATE, FERRITIN, TIBC, IRON, RETICCTPCT in the last 72 hours. Urine analysis:    Component Value Date/Time   COLORURINE YELLOW 11/06/2019 2151   APPEARANCEUR HAZY (A) 11/06/2019 2151   LABSPEC 1.019 11/06/2019 2151   PHURINE 6.0 11/06/2019 2151   GLUCOSEU NEGATIVE 11/06/2019 2151   HGBUR SMALL (  A) 11/06/2019 2151   BILIRUBINUR NEGATIVE 11/06/2019 2151   BILIRUBINUR Negative 09/18/2019 1137   KETONESUR 20 (A) 11/06/2019 2151   PROTEINUR 100 (A) 11/06/2019 2151   UROBILINOGEN negative (A) 09/18/2019 1137   NITRITE NEGATIVE 11/06/2019 2151   LEUKOCYTESUR SMALL (A) 11/06/2019 2151     Sherice Ijames M.D. [facility] 11/08/2019, 2:45 PM   Call night coverage person covering after 7pm

## 2019-11-08 NOTE — Progress Notes (Signed)
PHARMACY NOTE -  [facility] [facility] has been assisting with dosing of Zosyn for pancreatitis. Dosage remains stable at Zosyn 3.375gm IV Q8h to be infused over 4hrs  and need for further dosage adjustment appears unlikely at present.    Will sign off at this time.  Please reconsult if a change in clinical status warrants re-evaluation of dosage.  Netta Cedars, PharmD, BCPS 11/08/2019@8 :09 AM

## 2019-11-09 LAB — COMPREHENSIVE METABOLIC PANEL
ALT: 12 U/L (ref 0–44)
AST: 16 U/L (ref 15–41)
Albumin: 2.8 g/dL — ABNORMAL LOW (ref 3.5–5.0)
Alkaline Phosphatase: 36 U/L — ABNORMAL LOW (ref 38–126)
Anion gap: 7 (ref 5–15)
BUN: 8 mg/dL (ref 6–20)
CO2: 24 mmol/L (ref 22–32)
Calcium: 8.1 mg/dL — ABNORMAL LOW (ref 8.9–10.3)
Chloride: 106 mmol/L (ref 98–111)
Creatinine, Ser: 0.65 mg/dL (ref 0.44–1.00)
GFR calc Af Amer: >60 mL/min (ref >60)
GFR calc non Af Amer: >60 mL/min (ref >60)
Glucose, Bld: 72 mg/dL (ref 70–99)
Potassium: 3.6 mmol/L (ref 3.5–5.1)
Sodium: 137 mmol/L (ref 135–145)
Total Bilirubin: 0.5 mg/dL (ref 0.3–1.2)
Total Protein: 5 g/dL — ABNORMAL LOW (ref 6.5–8.1)

## 2019-11-09 LAB — CBC
HCT: 37.7 % (ref 36.0–46.0)
Hemoglobin: 12 g/dL (ref 12.0–15.0)
MCH: 32.3 pg (ref 26.0–34.0)
MCHC: 31.8 g/dL (ref 30.0–36.0)
MCV: 101.6 fL — ABNORMAL HIGH (ref 80.0–100.0)
Platelets: 259 10*3/uL (ref 150–400)
RBC: 3.71 MIL/uL — ABNORMAL LOW (ref 3.87–5.11)
RDW: 14.2 % (ref 11.5–15.5)
WBC: 9.4 10*3/uL (ref 4.0–10.5)
nRBC: 0 % (ref 0.0–0.2)

## 2019-11-09 LAB — LIPASE, BLOOD: Lipase: 74 U/L — ABNORMAL HIGH (ref 11–51)

## 2019-11-09 MED ORDER — TRAMADOL HCL 50 MG PO TABS: 100.0000 mg | ORAL_TABLET | Freq: Four times a day (QID) | ORAL | 0 refills | Status: DC | PRN

## 2019-11-09 MED ORDER — ONDANSETRON 4 MG PO TBDP: 4.0000 mg | ORAL_TABLET | Freq: Three times a day (TID) | ORAL | 0 refills | Status: DC | PRN

## 2019-11-09 NOTE — Discharge Summary (Signed)
Physician Discharge Summary   Patient ID: [name] MRN: [id number] DOB/AGE: 05-19-1961 59 y.o.  Admit date: 11/06/2019 Discharge date: 11/09/2019  Primary Care Physician:  [name], DO   Recommendations for Outpatient Follow-up:  1. Follow up with PCP in 1-2 weeks 2. Patient has a follow-up appointment with Dr. Bryan Lemma on 11/11/2019, recommended to keep the appointment, likely needs to be scheduled for EUS +/-ERCP  3. Holding Entresto (side effect  Pancreatitis) 4. Incidental infrarenal abdominal aortic aneurysm -CT abdomen showed infrarenal abdominal aortic aneurysm measuring up to 3.4 cm in size with irregular mural thrombus and ulcerated plaque, recommended follow-up by ultrasound in 3 years.   Home Health: None, at baseline Equipment/Devices: None  Discharge Condition: stable  CODE STATUS: FULL  Diet recommendation: Heart healthy, soft diet   Discharge Diagnoses:    . Acute recurrent pancreatitis, unclear cause . Benign essential HTN . Cerebrovascular accident (CVA) due to occlusion of left middle cerebral artery ([place]) . Chronic diastolic CHF (congestive heart failure) ([place]) . Coronary artery disease . Hyperlipidemia LDL goal <70 . incidental infrarenal abdominal aortic aneurysm   Consults:  Gastroenterology Surgery     Allergies:   Allergies  Allergen Reactions  . Lopressor [Metoprolol Tartrate] Swelling    Angioedema  . Allegra Allergy [Fexofenadine Hcl] Hives  . Dilaudid [Hydromorphone]     Hallucinations  . Nsaids     On Xarelto  . Gabapentin (Once-Daily) Rash  . Lipitor [Atorvastatin] Diarrhea     DISCHARGE MEDICATIONS: Allergies as of 11/09/2019      Reactions   Lopressor [metoprolol Tartrate] Swelling   Angioedema   Allegra Allergy [fexofenadine Hcl] Hives   Dilaudid [hydromorphone]    Hallucinations   Nsaids    On Xarelto   Gabapentin (once-daily) Rash   Lipitor [atorvastatin] Diarrhea      Medication List    STOP  taking these medications   Entresto 24-26 MG Generic drug: sacubitril-valsartan     TAKE these medications   acetaminophen 325 MG tablet Commonly known as: TYLENOL Take 2 tablets (650 mg total) by mouth every 6 (six) hours as needed for mild pain or moderate pain (or Fever >/= 101).   azelastine 0.1 % nasal spray Commonly known as: ASTELIN Place 1-2 sprays into both nostrils 2 (two) times daily as needed for rhinitis. Use in each nostril as directed   BACLOFEN PO Take 10 mg by mouth 2 (two) times daily.   calcium carbonate 500 MG chewable tablet Commonly known as: TUMS - dosed in mg elemental calcium Chew 1 tablet by mouth 2 (two) times daily.   carvedilol 3.125 MG tablet Commonly known as: COREG Take 1 tablet by mouth twice daily   cholecalciferol 1000 units tablet Commonly known as: VITAMIN D Take 1,000 Units by mouth daily.   famotidine 20 MG tablet Commonly known as: PEPCID Take 20 mg by mouth daily.   ferrous sulfate 325 (65 FE) MG tablet Take 325 mg by mouth 2 (two) times a week. Monday  and Thursday.   hydrOXYzine 10 MG tablet Commonly known as: ATARAX/VISTARIL Take 1 tablet (10 mg total) by mouth at bedtime.   levocetirizine 5 MG tablet Commonly known as: XYZAL Take 1 tablet (5 mg total) by mouth every evening.   mupirocin cream 2 % Commonly known as: Bactroban Apply 1 application topically 2 (two) times daily. Apply to area on buttocks. What changed:   when to take this  reasons to take this   ondansetron 4 MG disintegrating  tablet Commonly known as: Zofran ODT Take 1 tablet (4 mg total) by mouth every 8 (eight) hours as needed for nausea or vomiting.   pravastatin 20 MG tablet Commonly known as: Pravachol Take 1 tablet (20 mg total) by mouth daily.   rivaroxaban 20 MG Tabs tablet Commonly known as: Xarelto Take 1 tablet daily with evening meal   traMADol 50 MG tablet Commonly known as: ULTRAM Take 2 tablets (100 mg total) by mouth every 6  (six) hours as needed for moderate pain. What changed: how much to take   triamcinolone cream 0.1 % Commonly known as: KENALOG Apply 1 application topically 2 (two) times daily.   vitamin B-12 100 MCG tablet Commonly known as: CYANOCOBALAMIN Take 100 mcg by mouth daily.        Brief H and P: For complete details please refer to admission H and P, but in brief Patient is a 59 year old female with history of recurrent episodes of acute pancreatitis, atrial fibrillation, prior history of stroke, hypertension, hyperlipidemia presented with intractable epigastric abdominal pain, nausea and vomiting.  Patient reported however symptoms started soon after she had a bowl of soup and grilled cheese.   In ED, lipase level> 10,000, CT abdomen showed peripancreatic phlegmon or edema no evidence of pancreatic necrosis, persistent choledocholithiasis with calculi in proximal cystic duct, chronic pancreatic pseudocyst  Hospital Course:  Acute recurrent pancreatitis: Unclear etiology -Per patient third episode, was admitted in 12/15 -09/13/2019 and 1/15- 10/14/2019 for acute pancreatitis. CT abdomen pelvis showed interval worsening of peripancreatic phlegmon and edema, no necrosis.  Persistent choledocholithiasis with calculi and proximal cystic duct, worrisome for developing cholangitis. -Patient was placed on IV antibiotics. -  Lipase now trending down, > 10,000--> 837-> 299-> 74 today at the time of discharge. -GI and general surgery were consulted. -MRCP showed acute pancreatitis, no organized peripancreatic fluid collections, previously noted calcified stones in the cystic duct, not identified, no stones in the common bile duct. -Per general surgery, no need for  cholecystectomy, no acute cholecystitis currently. - GI recommended outpatient follow-up for possible EUS +/- ERCP.  IgG4 negative, less likely to be autoimmune pancreatitis, LFTs remained normal.   -Patient is now tolerating solid diet. -  She has an appointment with Dr. Bryan Lemma on 11/11/2019, wants to go home to follow-up with GI outpatient.     Cerebrovascular accident (CVA) due to occlusion of left middle cerebral artery ([facility]) -As no surgical intervention planned, restarted Xarelto    Hyperlipidemia LDL goal <70 -Hold off on statins for now    Coronary artery disease, history of chronic diastolic CHF -Continue Coreg, Xarelto -Patient was recommended to hold off on Entresto (possible side effect of pancreatitis) and follow-up with her cardiologist.    Benign essential HTN -BP stable, continue Coreg  Incidental infrarenal abdominal aortic aneurysm -CT abdomen showed infrarenal abdominal aortic aneurysm measuring up to 3.4 cm in size with irregular mural thrombus and ulcerated plaque, recommended follow-up by ultrasound in 3 years. -Patient is on Xarelto    Day of Discharge S: No acute issues, lipase down, no fevers or chills, diet advanced  BP 109/64 (BP Location: Right Arm)   Pulse 77   Temp 98 F (36.7 C) (Oral)   Resp 16   Ht 5\' 1"  (1.549 m)   Wt 54 kg   LMP  (LMP Unknown)   SpO2 90%   BMI 22.48 kg/m   Physical Exam: General: Alert and awake oriented x3 not in any acute distress. HEENT: anicteric sclera,  pupils reactive to light and accommodation CVS: S1-S2 clear no murmur rubs or gallops Chest: clear to auscultation bilaterally, no wheezing rales or rhonchi Abdomen: soft minimal tenderness, nondistended, normal bowel sounds Extremities: no cyanosis, clubbing or edema noted bilaterally Neuro: Cranial nerves II-XII intact, no focal neurological deficits    Get Medicines reviewed and adjusted: Please take all your medications with you for your next visit with your Primary MD  Please request your Primary MD to go over all hospital tests and procedure/radiological results at the follow up. Please ask your Primary MD to get all Hospital records sent to his/her office.  If you experience  worsening of your admission symptoms, develop shortness of breath, life threatening emergency, suicidal or homicidal thoughts you must seek medical attention immediately by calling 911 or calling your MD immediately  if symptoms less severe.  You must read complete instructions/literature along with all the possible adverse reactions/side effects for all the Medicines you take and that have been prescribed to you. Take any new Medicines after you have completely understood and accept all the possible adverse reactions/side effects.   Do not drive when taking pain medications.   Do not take more than prescribed Pain, Sleep and Anxiety Medications  Special Instructions: If you have smoked or chewed Tobacco  in the last 2 yrs please stop smoking, stop any regular Alcohol  and or any Recreational drug use.  Wear Seat belts while driving.  Please note  You were cared for by a hospitalist during your hospital stay. Once you are discharged, your primary care physician will handle any further medical issues. Please note that NO REFILLS for any discharge medications will be authorized once you are discharged, as it is imperative that you return to your primary care physician (or establish a relationship with a primary care physician if you do not have one) for your aftercare needs so that they can reassess your need for medications and monitor your lab values.   The results of significant diagnostics from this hospitalization (including imaging, microbiology, ancillary and laboratory) are listed below for reference.      Procedures/Studies:  CT ABDOMEN PELVIS W CONTRAST  Addendum Date: 11/07/2019   ADDENDUM REPORT: 11/07/2019 00:39 ADDENDUM: Correction: Results were discussed with Dr. Betsey Holiday, not Dr. Roderic Palau Electronically Signed   By: Lovena Le M.D.   On: 11/07/2019 00:39   Result Date: 11/07/2019 CLINICAL DATA:  Acute generalized abdominal pain with neutropenia, history of pancreatitis EXAM:  CT ABDOMEN AND PELVIS WITH CONTRAST TECHNIQUE: Multidetector CT imaging of the abdomen and pelvis was performed using the standard protocol following bolus administration of intravenous contrast. CONTRAST:  51mL OMNIPAQUE IOHEXOL 300 MG/ML  SOLN COMPARISON:  CT 10/11/2019 FINDINGS: Lower chest: Atelectatic changes present in the lung bases. No consolidation or effusion. Normal heart size. No pericardial effusion. Atherosclerotic calcification of the coronary arteries. Hepatobiliary: No focal liver abnormality is seen. Mild dilatation and thickening of the proximal cystic duct to the level 2 small calculi proximal to the junction with the common bile duct (2/23). No other gross biliary ductal dilatation is seen. Pancreas: There is diffuse peripancreatic fat stranding most prominent about the head and uncinate process, to a lesser extent the pancreatic body which is overall increased from comparison study. Redemonstration of a hypoattenuating cystic structure in the pancreatic head which measures 1.7 by 1.8 cm, not significantly changed from comparison study. Slight prominence of the pancreatic duct seen on comparison exam is diminished on today's study. No gross ductal dilatation  is seen. Remaining portions of the pancreatic parenchyma enhances normally without evidence of pancreatic necrosis. Inflammation appears predominantly phlegmonous without organized peripancreatic fluid collection. Spleen: Normal in size without focal abnormality. Adrenals/Urinary Tract: Nodular thickening of the left adrenal gland is similar to prior. Right adrenal gland is normal. Scattered subcentimeter hypoattenuating foci throughout both kidneys too small to fully characterize on CT imaging but statistically likely benign. 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 Bilateral nonobstructive nephrolithiasis. No ureteral calculi are seen. Urinary bladder is largely  decompressed at the time of exam and therefore poorly evaluated by CT imaging. Small cystocele. Stomach/Bowel: Persistent pre-pyloric hyperenhancement and mural thickening is likely secondary to the pancreatic inflammation but may result in delayed passage of ingested material given the degree of stomach distention. No small bowel dilatation or wall thickening. A normal appendix is visualized. No colonic dilatation or wall thickening. Scattered colonic diverticula without focal pericolonic inflammation to suggest diverticulitis. Vascular/Lymphatic: Extensive atherosclerotic calcification of the abdominal aorta and branch vessels. There is redemonstration of a infrarenal abdominal aortic aneurysm measuring up to 3.4 cm in size with irregular mural thrombus and ulcerated plaque (2/38 and 4/63). There is some adjacent stranding in the retroperitoneum arising from the pancreas and about the duodenum but without focal periaortic stranding identified. Reactive adenopathy again seen in the upper abdomen. Reproductive: Venous engorgement the parametrial and gonadal veins. Retroverted uterus. No concerning adnexal lesions. Other: Peripancreatic phlegmon and edema, increased from prior. No free intraperitoneal fluid or air. No organized collections or abscess. Pelvic floor laxity, similar to prior with associated cyst is seal. No bowel containing hernias. Musculoskeletal: Multilevel degenerative changes are present in the imaged portions of the spine. No acute osseous abnormality or suspicious osseous lesion. IMPRESSION: 1. Interval worsening of peripancreatic phlegmon and edema. No organized peripancreatic fluid collection or evidence of pancreatic necrosis. Stable cystic lesion in the head of the pancreas, favoring pseudocyst. 2. Persistent choledocholithiasis with calculi in the proximal cystic duct. Increasing cystic ductal dilatation mural enhancement and inflammation worrisome for developing cholangitis. 3. Persistent  pre-pyloric hyperenhancement and mural thickening of the stomach is likely secondary to the pancreatic inflammation but may result in delayed passage of ingested material given the degree of stomach distention. 4. Bilateral nonobstructive nephrolithiasis. 5. Infrarenal abdominal aortic aneurysm measuring up to 3.4 cm in size with irregular mural thrombus and ulcerated plaque. Recommend followup by ultrasound in 3 years. This recommendation follows ACR consensus guidelines: White Paper of the ACR Incidental Findings Committee II on Vascular Findings. J Am Coll Radiol 2013; 10:789-794. Aortic aneurysm NOS (ICD10-I71.9) 6. Aortic Atherosclerosis (ICD10-I70.0). These results were called by telephone at the time of interpretation on 11/07/2019 at 12:32 am to provider [facility] ZAMMIT , who verbally acknowledged these results. Electronically Signed: By: Lovena Le M.D. On: 11/07/2019 00:33   CT Abdomen Pelvis W Contrast  Result Date: 10/11/2019 CLINICAL DATA:  Abdominal distension. Elevated lipase. Referred by primary care physician for possible pancreatitis. Abdominal pain and nausea. EXAM: CT ABDOMEN AND PELVIS WITH CONTRAST TECHNIQUE: Multidetector CT imaging of the abdomen and pelvis was performed using the standard protocol following bolus administration of intravenous contrast. CONTRAST:  147mL OMNIPAQUE IOHEXOL 300 MG/ML  SOLN COMPARISON:  CT 09/10/2019 FINDINGS: Lower chest: Minor hypoventilatory changes and left lower lobe scarring. No pleural effusion or acute airspace disease. Hepatobiliary: No focal liver abnormality is seen. No gallstones, gallbladder wall thickening, or biliary dilatation. Pancreas: Mild peripancreatic fat  stranding is most prominent about the head and uncinate process, to a lesser extent pancreatic body. Cystic lesion within the pancreatic head measures 1.8 x 1.7 x 1.8 cm, previously 2.2 x 1.9 x 2.0 cm. No new peripancreatic fluid collection. Mild prominence of the pack small pancreatic  duct at 4 mm. No pancreatic calcifications. Spleen: Normal in size without focal abnormality. The splenic vein is patent. Adrenals/Urinary Tract: Left adrenal thickening without dominant nodule. Right adrenal gland is normal. 6 mm stone in the right proximal ureter without associated hydronephrosis, stone unchanged in position from prior exam. No perinephric edema. There is symmetric excretion on delayed phase imaging. Small low-density lesions within both kidneys are too small to characterize but likely cysts. Urinary bladder is partially distended. No bladder stone or wall thickening. Mild pelvic floor descent with small cystocele. Stomach/Bowel: Wall thickening of the pre pyloric stomach and proximal duodenum, may be in part reactive. Stomach is distended. Small bowel is otherwise decompressed. No obstruction. Normal appendix. Mild colonic diverticulosis without diverticulitis. Small volume of colonic stool. Vascular/Lymphatic: Infrarenal aortic aneurysm at 3.1 cm with mural thrombus and ulcerated plaque. No periaortic stranding. Moderate aortic atherosclerosis. The portal and splenic veins are patent. Mesenteric vessels are grossly patent. Dilatation of the ovarian veins with contrast refluxing, left greater than right. Left ovarian vein measures 7 mm. Small peripancreatic nodes, largest measuring 10 mm, likely reactive. Reproductive: Prominent periuterine and adnexal vascularity. Uterus otherwise quiescent. No suspicious adnexal mass. Other: Mild peripancreatic edema tracks in the central mesentery. No significant ascites. No free air. Musculoskeletal: There are no acute or suspicious osseous abnormalities. Mild degenerative change in the spine. IMPRESSION: 1. Acute pancreatitis. Cystic lesion in the pancreatic head measures 1.8 x 1.7 x 1.8 cm, previously 2.2 x 1.9 x 2.0 cm on CT 1 month ago, favoring pseudocyst with slight improvement. 2. Wall thickening of the pre pyloric stomach and proximal duodenum, may be  in part reactive. 3. Infrarenal aortic aneurysm measuring 3.1 cm. Mural thrombus with ulcerated plaque, unchanged from recent prior exam. 4. Dilatation of the ovarian veins with contrast refluxing, left greater than right. This can be seen with pelvic congestion syndrome in the appropriate clinical setting. 5. Right proximal ureteral calculus, unchanged in position from prior exam. This is likely nonobstructing, as there is no hydronephrosis in symmetric excretion on delayed phase imaging. 6. Colonic diverticulosis without diverticulitis. Aortic Atherosclerosis (ICD10-I70.0). Aortic aneurysm NOS (ICD10-I71.9). Electronically Signed   By: Keith Rake M.D.   On: 10/11/2019 20:37   MR 3D Recon At Scanner  Result Date: 11/07/2019 CLINICAL DATA:  Acute pancreatitis. EXAM: MRI ABDOMEN WITHOUT AND WITH CONTRAST (INCLUDING MRCP) TECHNIQUE: Multiplanar multisequence MR imaging of the abdomen was performed both before and after the administration of intravenous contrast. Heavily T2-weighted images of the biliary and pancreatic ducts were obtained, and three-dimensional MRCP images were rendered by post processing. CONTRAST:  56mL GADAVIST GADOBUTROL 1 MMOL/ML IV SOLN COMPARISON:  11/07/2019 FINDINGS: Lower chest: Atelectasis identified within the lung bases. Hepatobiliary: Hepatic steatosis. No focal liver abnormality. Mild gallbladder distention. No gallbladder wall inflammation noted. No gallstones identified. The previously noted calcified stones within the cystic duct are not confidently identified on today's exam although they may be obscured by motion artifact. No significant intrahepatic bile duct dilatation identified. The common bile duct appears focally ectatic proximal measuring up to 8 mm. No stones identified within the common bile duct. Pancreas: No main duct dilatation identified. Pancreatic edema with surrounding inflammation and free fluid is again noted  compatible with acute pancreatitis. Within the  head of pancreas there is again seen a 1.8 x 1.3 cm heterogeneous, mixed signal intensity structure favoring pseudo cyst, image 24/4. Previously this measured 1.8 x 1.9 cm. There are no mature fluid collections identified within the upper abdomen to suggest additional pseudocysts. Spleen:  Within normal limits in size and appearance. Adrenals/Urinary Tract: Normal appearance of the adrenal glands. No hydronephrosis. Multiple bilateral too small to characterize T2 hyperintense kidney lesions are again noted. Stomach/Bowel: Persistent distension of the gastric lumen. Pre-pyloric hyperenhancement and mural thickening is similar to previous exam and is felt to be likely secondary to pancreatic inflammation. No abnormal bowel wall thickening, inflammation or distension noted. Vascular/Lymphatic: Aortic atherosclerosis. Infrarenal abdominal aortic aneurysm is again identified, better characterized on CT from 11/06/2019. No adenopathy. Other: Peripancreatic phlegmon and edema is identified. This appears similar to 11/06/2019. No extra pancreatic organized fluid collections identified. Musculoskeletal: No suspicious bone lesions identified. IMPRESSION: 1. Redemonstration of acute pancreatitis. No organized peripancreatic fluid collections are identified. 2. The previously noted calcified stones within the cystic duct are not confidently identified on today's exam although they may be obscured by motion artifact. No significant intrahepatic or common bile duct dilatation identified. No stones identified within the common bile duct. 3. Persistent distension of the gastric lumen with pre-pyloric hyperenhancement and mural thickened mural thickeningfavored to be secondary to pancreatic inflammation. 4. Infrarenal abdominal aortic aneurysm, better characterized on recent CT from 02/10/201. Electronically Signed   By: Kerby Moors M.D.   On: 11/07/2019 15:19   MR ABDOMEN MRCP W WO CONTAST  Result Date: 11/07/2019 CLINICAL  DATA:  Acute pancreatitis. EXAM: MRI ABDOMEN WITHOUT AND WITH CONTRAST (INCLUDING MRCP) TECHNIQUE: Multiplanar multisequence MR imaging of the abdomen was performed both before and after the administration of intravenous contrast. Heavily T2-weighted images of the biliary and pancreatic ducts were obtained, and three-dimensional MRCP images were rendered by post processing. CONTRAST:  90mL GADAVIST GADOBUTROL 1 MMOL/ML IV SOLN COMPARISON:  11/07/2019 FINDINGS: Lower chest: Atelectasis identified within the lung bases. Hepatobiliary: Hepatic steatosis. No focal liver abnormality. Mild gallbladder distention. No gallbladder wall inflammation noted. No gallstones identified. The previously noted calcified stones within the cystic duct are not confidently identified on today's exam although they may be obscured by motion artifact. No significant intrahepatic bile duct dilatation identified. The common bile duct appears focally ectatic proximal measuring up to 8 mm. No stones identified within the common bile duct. Pancreas: No main duct dilatation identified. Pancreatic edema with surrounding inflammation and free fluid is again noted compatible with acute pancreatitis. Within the head of pancreas there is again seen a 1.8 x 1.3 cm heterogeneous, mixed signal intensity structure favoring pseudo cyst, image 24/4. Previously this measured 1.8 x 1.9 cm. There are no mature fluid collections identified within the upper abdomen to suggest additional pseudocysts. Spleen:  Within normal limits in size and appearance. Adrenals/Urinary Tract: Normal appearance of the adrenal glands. No hydronephrosis. Multiple bilateral too small to characterize T2 hyperintense kidney lesions are again noted. Stomach/Bowel: Persistent distension of the gastric lumen. Pre-pyloric hyperenhancement and mural thickening is similar to previous exam and is felt to be likely secondary to pancreatic inflammation. No abnormal bowel wall thickening,  inflammation or distension noted. Vascular/Lymphatic: Aortic atherosclerosis. Infrarenal abdominal aortic aneurysm is again identified, better characterized on CT from 11/06/2019. No adenopathy. Other: Peripancreatic phlegmon and edema is identified. This appears similar to 11/06/2019. No extra pancreatic organized fluid collections identified. Musculoskeletal: No suspicious bone lesions identified.  IMPRESSION: 1. Redemonstration of acute pancreatitis. No organized peripancreatic fluid collections are identified. 2. The previously noted calcified stones within the cystic duct are not confidently identified on today's exam although they may be obscured by motion artifact. No significant intrahepatic or common bile duct dilatation identified. No stones identified within the common bile duct. 3. Persistent distension of the gastric lumen with pre-pyloric hyperenhancement and mural thickened mural thickeningfavored to be secondary to pancreatic inflammation. 4. Infrarenal abdominal aortic aneurysm, better characterized on recent CT from 02/10/201. Electronically Signed   By: Kerby Moors M.D.   On: 11/07/2019 15:19   CUP PACEART REMOTE DEVICE CHECK  Result Date: 10/21/2019 Carelink summary report received. Battery status OK. Normal device function. No new symptom episodes, tachy episodes, brady, or pause episodes. No new AF episodes. Monthly summary reports and ROV/PRN  US Abdomen Limited RUQ  Result Date: 11/07/2019 CLINICAL DATA:  Pancreatitis.  Cystic pancreatic mass on CT. EXAM: ULTRASOUND ABDOMEN LIMITED RIGHT UPPER QUADRANT COMPARISON:  CT, 11/06/2019 FINDINGS: Gallbladder: No gallstones or wall thickening visualized. No sonographic Murphy sign noted by sonographer. Common bile duct: Diameter: 5 mm Liver: Normal echogenicity. Normal size. No mass or focal lesion. Portal vein is patent on color Doppler imaging with normal direction of blood flow towards the liver. Other: In the pancreatic head, there is a  hypoechoic area measuring 2.9 x 2.6 x 3.0 cm, which corresponds to the cystic appearing mass noted on the previous day's CT. IMPRESSION: 1. No gallstones or bile duct dilation. No sonographic liver abnormality. 2. Hypoechoic pancreatic head lesion consistent with the cystic mass noted on the previous day's CT. Electronically Signed   By: Lajean Manes M.D.   On: 11/07/2019 10:06       LAB RESULTS: Basic Metabolic Panel: Recent Labs  Lab 11/08/19 0428 11/09/19 0446  NA 135 137  K 3.7 3.6  CL 107 106  CO2 24 24  GLUCOSE 82 72  BUN 16 8  CREATININE 0.64 0.65  CALCIUM 8.0* 8.1*   Liver Function Tests: Recent Labs  Lab 11/08/19 0428 11/09/19 0446  AST 14* 16  ALT 11 12  ALKPHOS 39 36*  BILITOT 0.7 0.5  PROT 4.8* 5.0*  ALBUMIN 2.7* 2.8*   Recent Labs  Lab 11/08/19 0428 11/09/19 0446  LIPASE 299* 74*   No results for input(s): AMMONIA in the last 168 hours. CBC: Recent Labs  Lab 11/08/19 0428 11/08/19 0428 11/09/19 0446  WBC 9.3  --  9.4  HGB 12.3  --  12.0  HCT 39.3  --  37.7  MCV 104.2*   < > 101.6*  PLT 273  --  259   < > = values in this interval not displayed.   Cardiac Enzymes: No results for input(s): CKTOTAL, CKMB, CKMBINDEX, TROPONINI in the last 168 hours. BNP: Invalid input(s): POCBNP CBG: Recent Labs  Lab 11/07/19 0800  GLUCAP 111*       Disposition and Follow-up: Discharge Instructions    Diet - low sodium heart healthy   Complete by: As directed    Discharge instructions   Complete by: As directed    SOFT LOW FAT DIET. Please HOLD entresto until you follow-up with your cardiologist.   Increase activity slowly   Complete by: As directed        DISPOSITION: home    [place], [place], DO Follow up on 11/11/2019.   Specialty: Gastroenterology Why: at 2:00PM. please keep your appt  Contact  information: [place] [address] [phone]         Johnathan Hausen, MD. Schedule an appointment as soon as possible for a visit in 2 week(s).   Specialty: General Surgery Contact information: [address] [place] [address] [phone]        Satira Sark, MD. Schedule an appointment as soon as possible for a visit in 2 week(s).   Specialty: Cardiology Contact information: [place] [address] [phone]            Time coordinating discharge:  32mins   Signed:   Estill Cotta M.D. [facility] 11/09/2019, 10:38 AM

## 2019-11-09 NOTE — Progress Notes (Signed)
Pt provided and explained dc instructions in detail. Patient verbalizes understanding. Patient escorted to lobby in wheelchair to be discharged home with spouse.

## 2019-11-11 ENCOUNTER — Telehealth: Payer: Self-pay | Admitting: Family Medicine

## 2019-11-11 ENCOUNTER — Ambulatory Visit (INDEPENDENT_AMBULATORY_CARE_PROVIDER_SITE_OTHER): Payer: 59 | Admitting: Gastroenterology

## 2019-11-11 ENCOUNTER — Telehealth: Payer: Self-pay

## 2019-11-11 ENCOUNTER — Encounter: Payer: Self-pay | Admitting: Gastroenterology

## 2019-11-11 VITALS — BP 98/62 | HR 84 | Temp 97.8°F | Ht 60.0 in | Wt 117.0 lb

## 2019-11-11 DIAGNOSIS — K859 Acute pancreatitis without necrosis or infection, unspecified: Secondary | ICD-10-CM

## 2019-11-11 DIAGNOSIS — K862 Cyst of pancreas: Secondary | ICD-10-CM | POA: Diagnosis not present

## 2019-11-11 DIAGNOSIS — R1013 Epigastric pain: Secondary | ICD-10-CM | POA: Diagnosis not present

## 2019-11-11 NOTE — Telephone Encounter (Signed)
Pt was called back and told to go to GI and see surgeon as directed at the hospital. She agreed with plan and was going to GI today.

## 2019-11-11 NOTE — Patient Instructions (Signed)
If you are age 60 or older, your body mass index should be between 23-30. Your Body mass index is 22.85 kg/m. If this is out of the aforementioned range listed, please consider follow up with your Primary Care Provider.  If you are age 52 or younger, your body mass index should be between 19-25. Your Body mass index is 22.85 kg/m. If this is out of the aformentioned range listed, please consider follow up with your Primary Care Provider.   You have been scheduled for an endoscopy. Please follow written instructions given to you at your visit today. If you use inhalers (even only as needed), please bring them with you on the day of your procedure. Your physician has requested that you go to www.startemmi.com and enter the access code given to you at your visit today. This web site gives a general overview about your procedure. However, you should still follow specific instructions given to you by our office regarding your preparation for the procedure.  You will be contacted by our office prior to your procedure for directions on holding your Xarelto.  If you do not hear from our office 1 week prior to your scheduled procedure, please call [phone] to discuss.   Thank you for choosing me and [facility].   Vito Cirigliano, DO  Due to recent COVID-19 restrictions implemented by our local and state authorities and in an effort to keep both patients and staff as safe as possible, our hospital system now requires COVID-19 testing prior to any scheduled hospital procedure. Please go to our [facility] location drive thru testing site ([address], [place], [place] [address]) on 12/19/19 at  8:50 am. There will be multiple testing areas, the first checkpoint being for pre-procedure/surgery testing. Get into the right (yellow) lane that leads to the PAT testing team. You will not be billed at the time of testing but may receive a bill later depending on your insurance. The  approximate cost of the test is $100. You must agree to quarantine from the time of your testing until the procedure date on 12/23/19 . This should include staying at home with ONLY the people you live with. Avoid take-out, grocery store shopping or leaving the house for any non-emergent reason. Failure to have your COVID-19 test done on the date and time you have been scheduled will result in cancellation of procedure. Please call our office at [phone] if you have any questions.

## 2019-11-11 NOTE — Progress Notes (Signed)
P  Chief Complaint:    Hospital follow-up, acute recurrent pancreatitis  GI History: 59 year old female with a history of CVA, CAD/two-vessel CABG, CHF, atrial fibrillation (on Xarelto), now with acute recurrent pancreatitis of undiagnosed etiology.  -Admitted 12/15-18, 2020 with acute pancreatitis with unclear etiology. -CT notable for 2.2 cm cyst versus pseudocyst in the head of pancreas.    -Readmitted 1/17-18, 2021 with acute recurrent pancreatitis. -CT acute pancreatitis, slight improvement in size of pseudocyst, wall thickening of prepyloric stomach and proximal duodenum (possibly reactive) -IgG4 negative -Lasix stopped  -Readmitted 2/10-13, 2021 with acute recurrent pancreatitis -Lipase >10k, improved to 837 then 74 by d/c -WBC 17.5, hemoconcentration, normal BUN/creatinine -Blood cultures negative -Admission CT with possible cystic duct stones peripancreatic phlegmon and edema c/w acute pancreatitis.  Stable cystic lesion in HOP -RUQ [place]: No gallstones, CBD 5 mm, hypoechoic HOP lesion (cystic) -MRI/MRCP 09/05/2020: Hepatic steatosis, mild GB distention, focally ectatic CBD measuring 8 mm without CDL.  Pancreatic edema c/w AP, 1.8 x 1.3 cm heterogenous structure (favoring pseudocyst), previously 1.8 x 1.9 cm.  Cystic duct stones not appreciated. -Liver enzymes remained normal, but consideration for microlithiasis as etiology -Stopped Entresto   HPI:    Patient is a 59 y.o. female presenting to the Gastroenterology Clinic for follow-up.  Was last seen by me in the GI clinic on 10/11/2019 as follow-up from her initial admission for Acute Pancreatitis.  Was having recurrence of index pain and nausea, with repeat labs demonstrating recurrence and she was hospitalized again in mid January.  Discharged and was doing okay until late last week, with a third admission for acute recurrent pancreatitis as outlined above.  Today, she states her pain has improved, but still using prn pain  med. No longer with any n/v.  Tolerating p.o.  Again went through medications today.  [name] was stopped x3 months during Allergy w/u, then restarted in December?  She is unsure of the timeline of this medication, but per admission H&P's, she appears to have been on that medication for each admission.  If that is the case, this could certainly be possible etiology.  This was stopped on her recent admission, and no longer taking.  Imaging during recent admission with incidentally noted infrarenal abdominal aortic aneurysm measuring 3.4 cm with irregular mural thrombus, with recommendation for follow-up ultrasound in 3 years.  Defer that work-up to PCM.   Review of systems:     No chest pain, no SOB, no fevers, no urinary sx   Past Medical History:  Diagnosis Date  . Angio-edema   . Arthritis   . CAD (coronary artery disease)    a. s/p NSTEMI in 10/2017 with cath showing LM disease --> s/p CABG on 11/23/2017 with LIMA-LAD and SVG-OM.   [name] Hay fever   . Ischemic cardiomyopathy    a. EF 35-40% by echo in 10/2017  . NSTEMI (non-ST elevated myocardial infarction) ([place])   . PAF (paroxysmal atrial fibrillation) ([place])    Diagnosed by ILR  . Pneumonia 11/16/2017  . Stroke [facility]) 08/23/2017   Left MCA infarct status post TPA and thrombectomy  . Urticaria     Patient's surgical history, family medical history, social history, medications and allergies were all reviewed in Epic    Current Outpatient Medications  Medication Sig Dispense Refill  . acetaminophen (TYLENOL) 325 MG tablet Take 2 tablets (650 mg total) by mouth every 6 (six) hours as needed for mild pain or moderate pain (or Fever >/= 101).    [name]  azelastine (ASTELIN) 0.1 % nasal spray Place 1-2 sprays into both nostrils 2 (two) times daily as needed for rhinitis. Use in each nostril as directed 30 mL 5  . BACLOFEN PO Take 10 mg by mouth 2 (two) times daily.    . calcium carbonate (TUMS - DOSED IN MG ELEMENTAL CALCIUM) 500 MG chewable  tablet Chew 1 tablet by mouth 2 (two) times daily.     . carvedilol (COREG) 3.125 MG tablet Take 1 tablet by mouth twice daily 180 tablet 1  . cholecalciferol (VITAMIN D) 1000 units tablet Take 1,000 Units by mouth daily.    . famotidine (PEPCID) 20 MG tablet Take 20 mg by mouth daily.     . ferrous sulfate 325 (65 FE) MG tablet Take 325 mg by mouth 2 (two) times a week. Monday  and Thursday.    . hydrOXYzine (ATARAX/VISTARIL) 10 MG tablet Take 1 tablet (10 mg total) by mouth at bedtime. 90 tablet 1  . levocetirizine (XYZAL) 5 MG tablet Take 1 tablet (5 mg total) by mouth every evening. 60 tablet 5  . mupirocin cream (BACTROBAN) 2 % Apply 1 application topically 2 (two) times daily. Apply to area on buttocks. (Patient taking differently: Apply 1 application topically 2 (two) times daily as needed (rash). Apply to area on buttocks.) 15 g 0  . ondansetron (ZOFRAN ODT) 4 MG disintegrating tablet Take 1 tablet (4 mg total) by mouth every 8 (eight) hours as needed for nausea or vomiting. 30 tablet 0  . pravastatin (PRAVACHOL) 20 MG tablet Take 1 tablet (20 mg total) by mouth daily. 30 tablet 3  . rivaroxaban (XARELTO) 20 MG TABS tablet Take 1 tablet daily with evening meal 90 tablet 3  . traMADol (ULTRAM) 50 MG tablet Take 2 tablets (100 mg total) by mouth every 6 (six) hours as needed for moderate pain. 30 tablet 0  . triamcinolone cream (KENALOG) 0.1 % Apply 1 application topically 2 (two) times daily. 45 g 3  . vitamin B-12 (CYANOCOBALAMIN) 100 MCG tablet Take 100 mcg by mouth daily.     No current facility-administered medications for this visit.    Physical Exam:     BP 98/62   Pulse 84   Temp 97.8 F (36.6 C)   Ht 5' (1.524 m)   Wt 117 lb (53.1 kg)   LMP  (LMP Unknown)   BMI 22.85 kg/m   GENERAL:  Pleasant female in NAD PSYCH: : Cooperative, normal affect EENT:  conjunctiva pink, mucous membranes moist, neck supple without masses CARDIAC:  RRR, no murmur heard, no peripheral  edema PULM: Normal respiratory effort, lungs CTA bilaterally, no wheezing ABDOMEN: Mild TTP in upper abdomen/LUQ without rebound or guarding.  Nondistended, soft,  No obvious masses, no hepatomegaly,  normal bowel sounds SKIN:  turgor, no lesions seen Musculoskeletal:  Normal muscle tone, normal strength NEURO: Alert and oriented x 3, no focal neurologic deficits   IMPRESSION and PLAN:    1) Acute recurrent pancreatitis 2) Upper abdominal pain 3) Pancreatic cyst  Acute recurrent pancreatitis of unclear etiology.  Previous imaging without cholelithiasis, and otherwise normal TG, does not drink.  No preceding medication changes, but possible that her Entresto (sacubitril/valsartan; not commonly implicated however) or furosemide could have been the culprit agents.  Both are now stopped.  -Plan for EUS to evaluate for microlithiasis -With GB in situ, may need to consider ccy -Continue p.o. intake as tolerated -Wean off pain medications.  Tylenol would be okay  4) Infrarenal  aortic aneurysm Imaging during recent admission with incidentally noted infrarenal abdominal aortic aneurysm measuring 3.4 cm with irregular mural thrombus, with recommendation for follow-up ultrasound in 3 years.  Defer that work-up to PCM.     I spent 35 minutes of time, including in depth chart review, independent review of results as outlined above, communicating results with the patient directly, face-to-face time with the patient, coordinating care, and ordering studies and medications as appropriate, and documentation.    Lavena Bullion ,DO, FACG 11/11/2019, 2:45 PM

## 2019-11-11 NOTE — Telephone Encounter (Signed)
[facility] Pre-operative Risk Assessment     Request for surgical clearance:     Endoscopy Procedure  What type of surgery is being performed?     EUS  When is this surgery scheduled?     12/23/19  What type of clearance is required ?   Pharmacy  Are there any medications that need to be held prior to surgery and how long? [place] TWO DAYS PRIOR  Practice name and name of physician performing surgery?      [facility]/[name] Landmark  What is your office phone and fax number?      Phone- [phone]  [phone]  Anesthesia type (None, local, MAC, general) ?       MAC

## 2019-11-11 NOTE — Telephone Encounter (Signed)
Transition Care Management Follow-up Telephone Call   Date discharged? 11/09/19   How have you been since you were released from the hospital?  okay   Do you understand why you were in the hospital? yes   Do you understand the discharge instructions? yes   Where were you discharged to? Home   Items Reviewed:  Medications reviewed: no  Allergies reviewed: no  Dietary changes reviewed: no  Referrals reviewed: yes   Functional Questionnaire:   Activities of Daily Living (ADLs):   She states they are independent in the following: all States they require assistance with the following: none   Any transportation issues/concerns?: no   Any patient concerns? no, she is seeing GI today at 2pm.   Confirmed importance and date/time of follow-up visits scheduled no  Provider Appointment booked with  Confirmed with patient if condition begins to worsen call PCP or go to the ER.  Patient was given the office number and encouraged to call back with question or concerns.  : yes, patient unable to schedule at this time.  She will call back to schedule one to two week hospital follow up.

## 2019-11-11 NOTE — Telephone Encounter (Signed)
Requesting for Wells Guiles to call her back.  She received a call this morning about a follow up appt with Dr Raoul Pitch. She doesn't know why she is seeing a Psychologist, sport and exercise.  Please call her husband's cell today. [phone]

## 2019-11-12 ENCOUNTER — Telehealth: Payer: Self-pay | Admitting: Gastroenterology

## 2019-11-12 LAB — CULTURE, BLOOD (ROUTINE X 2)
Culture: NO GROWTH
Culture: NO GROWTH
Special Requests: ADEQUATE

## 2019-11-12 NOTE — Telephone Encounter (Signed)
Pt is scheduled for an EUS 12/23/19 c [name] Landmark.  Pt's husband is concerned and requested for pt to have EUS sooner.  He stated that pt has been hospitalised three times within the past three months.

## 2019-11-12 NOTE — Telephone Encounter (Signed)
Patient with diagnosis of afib on Xarelto for anticoagulation.    Procedure: EUS Date of procedure: 12/23/19  CHADS2-VASc score of  6 (CHF, HTN, stroke/tia x 2, CAD, female)  CrCl 67 ml/min  Patient is high risk off anticoagulation due to history of stroke. Would recommend patient hold Xarelto only 1 day prior to procedure.  Will need to defer to Dr. Curt Bears or Dr. Domenic Polite if requesting MD is not agreeable to a 1 day hold.

## 2019-11-12 NOTE — Telephone Encounter (Signed)
I spoke with the pt and her husband and advised that no sooner appts are available. They have been invited to call to see if we have had any cancellations.  The pt's husband was upset because the pt has been hospitalized 3 times over the past 3 months.  I tried to explain that there are only certain number of days that are available for hospital cases.  I again advised that the pt can call and ask about cancellations at any time.  The pt has been advised of the information and verbalized understanding.

## 2019-11-12 NOTE — Telephone Encounter (Signed)
OK for patient to be off of Xarelto the day prior to procedure and the morning of procedure.  I will let patient know when to restart anticoagulation based on findings. She could be at a slight increased risk for bleeding if pancreatic biopsies are performed (though we do not anticipate this based on current imaging). Thank you. GM

## 2019-11-12 NOTE — Telephone Encounter (Signed)
Pt's husband Herbie Baltimore called very upset asking to speak directly to Dr. Bryan Lemma. He states that his wife does not have 6 weeks to wait for EUS and she needs to have this procedure done in two weeks as Dr. Bryan Lemma suggested.

## 2019-11-12 NOTE — Telephone Encounter (Signed)
Can you assist this patient with this request? Let me know if there is anything I can assist with??? Thanks Bre

## 2019-11-13 NOTE — Telephone Encounter (Signed)
Spoke with patient this morning regarding holding her Xarelto ONE day prior to her procedure.  Patient verbalized understanding.

## 2019-11-13 NOTE — Telephone Encounter (Signed)
I spoke with patient this morning.  Discussed that delaying EUS for some bit of time can actually be beneficial in improving endosonographic views.  Additionally discussed current scheduling.  If there are cancellations, can potentially move this up by a couple of weeks.  All questions answered and appreciative of the call back.  If she should have recurrence of index symptoms, to call the office, or if after hours return to ER.

## 2019-11-14 ENCOUNTER — Ambulatory Visit: Payer: 59 | Admitting: Physical Medicine & Rehabilitation

## 2019-11-15 ENCOUNTER — Telehealth: Payer: Self-pay

## 2019-11-15 NOTE — Telephone Encounter (Signed)
-----   Message from Irving Copas., MD sent at 11/14/2019  2:31 PM EST ----- [name], Thanks for the update.Dr. Bryan Lemma spoke with the patient about the timing issues.I cannot get her done in February.Can we try to get her added on for 3/8 at [facility] and place it as a 60-minute EUS?I think that is the best we can do if we can get her added on for that day.Thanks.GM ----- Message ----- From: Timothy Lasso, RN Sent: 11/13/2019  10:29 AM EST To: Irving Copas., MD  He has 3/25 but that is not going to move it much sooner.  I spoke with the pt's husband yesterday and he specifically stated he wants the procedure scheduled in February.   ----- Message ----- From: Irving Copas., MD Sent: 11/13/2019  10:07 AM EST To: Timothy Lasso, RN, [name], Thanks for reaching out. Because of the COVID issues and now staffing issues, I no longer have my extra full day Thursday anymore. So my dates are pretty full for now. I also am away for a week in mid-March as well. I think this is the reasoning for the time period that is a bit further out. Kaitlyn Fuchs, do you know if DJ has any openings for the beginning or March? Thanks. GM ----- Message ----- From: Lavena Bullion, DO Sent: 11/13/2019   8:32 AM EST To: Irving Copas., MD  GM,  This patient has acute recurrent pancreatitis, starting in 08/2019, now with 3 hospitalizations, most recently last week.  I saw her in clinic earlier this week.  She is improving.  Still no etiology.  A few potential inciting medications have since been stopped (diuretics, ARB).  GB still in place, some may be microlithiasis.  She is scheduled for EUS with you at the end of March, but she is very concerned about waiting that long.  I explained that some delay and "tincture of time "may help improve endosonographic views, but she is quite anxious about waiting, fearful of recurrent attack.  Just wanted to put her on your  radar in case you have any cancellations, and may be can get this done around week four or so (early to mid March).  Let me know if you have other suggestions or think otherwise.  Thanks for your help!  [employer]

## 2019-11-15 NOTE — Telephone Encounter (Signed)
The appt has been moved to 3/8 and COVID test moved to 3/4.  The pt has been moved and advised

## 2019-11-21 ENCOUNTER — Ambulatory Visit (INDEPENDENT_AMBULATORY_CARE_PROVIDER_SITE_OTHER): Payer: 59 | Admitting: *Deleted

## 2019-11-21 DIAGNOSIS — I63512 Cerebral infarction due to unspecified occlusion or stenosis of left middle cerebral artery: Secondary | ICD-10-CM

## 2019-11-21 LAB — CUP PACEART REMOTE DEVICE CHECK
Date Time Interrogation Session: 20210225000213
Implantable Pulse Generator Implant Date: 20181203

## 2019-11-21 NOTE — Progress Notes (Signed)
ILR Remote 

## 2019-11-27 ENCOUNTER — Telehealth: Payer: Self-pay | Admitting: Gastroenterology

## 2019-11-27 ENCOUNTER — Encounter: Payer: Self-pay | Admitting: Occupational Therapy

## 2019-11-27 DIAGNOSIS — M6281 Muscle weakness (generalized): Secondary | ICD-10-CM

## 2019-11-27 DIAGNOSIS — R1010 Upper abdominal pain, unspecified: Secondary | ICD-10-CM

## 2019-11-27 DIAGNOSIS — R1013 Epigastric pain: Secondary | ICD-10-CM

## 2019-11-27 NOTE — Telephone Encounter (Signed)
Patient is calling with concerns says she has a lot of abdominal pain on her right side and is feeling weak.

## 2019-11-27 NOTE — Therapy (Signed)
[facility] [address] [place], [place], [address] Phone: [phone]   Fax:  [phone]  Occupational Therapy Treatment  Patient Details  Name: [name] MRN: [id number] Date of Birth: November 05, 1960 No data recorded  Encounter Date: 11/27/2019    Past Medical History:  Diagnosis Date  . Angio-edema   . Arthritis   . CAD (coronary artery disease)    a. s/p NSTEMI in 10/2017 with cath showing LM disease --> s/p CABG on 11/23/2017 with LIMA-LAD and SVG-OM.   [name] Hay fever   . Ischemic cardiomyopathy    a. EF 35-40% by echo in 10/2017  . NSTEMI (non-ST elevated myocardial infarction) ([place])   . PAF (paroxysmal atrial fibrillation) ([place])    Diagnosed by ILR  . Pneumonia 11/16/2017  . Stroke [facility]) 08/23/2017   Left MCA infarct status post TPA and thrombectomy  . Urticaria     Past Surgical History:  Procedure Laterality Date  . CORONARY ARTERY BYPASS GRAFT N/A 11/23/2017   Procedure: CORONARY ARTERY BYPASS GRAFTING (CABG) x 2, ON PUMP, USING LEFT INTERNAL MAMMARY ARTERY AND RIGHT GREATER SAPHENOUS VEIN HARVESTED ENDOSCOPICALLY;  Surgeon: Gaye Pollack, MD;  Location: [facility];  Service: Open Heart Surgery;  Laterality: N/A;  . CRYOABLATION     cervical  . IR ANGIO INTRA EXTRACRAN SEL COM CAROTID INNOMINATE UNI R MOD SED  08/21/2017  . IR ANGIO VERTEBRAL SEL SUBCLAVIAN INNOMINATE UNI R MOD SED  08/21/2017  . IR PERCUTANEOUS ART THROMBECTOMY/INFUSION INTRACRANIAL INC DIAG ANGIO  08/21/2017  . IR RADIOLOGIST EVAL & MGMT  10/30/2017  . LOOP RECORDER INSERTION N/A 08/28/2017   Procedure: LOOP RECORDER INSERTION;  Surgeon: Constance Haw, MD;  Location: [facility];  Service: Cardiovascular;  Laterality: N/A;  . RADIOLOGY WITH ANESTHESIA N/A 08/21/2017   Procedure: RADIOLOGY WITH ANESTHESIA;  Surgeon: Luanne Bras, MD;  Location: [facility];  Service: Radiology;  Laterality: N/A;  . RIGHT/LEFT HEART CATH AND  CORONARY ANGIOGRAPHY N/A 11/20/2017   Procedure: RIGHT/LEFT HEART CATH AND CORONARY ANGIOGRAPHY;  Surgeon: Jettie Booze, MD;  Location: [facility];  Service: Cardiovascular;  Laterality: N/A;  . TEE WITHOUT CARDIOVERSION N/A 08/28/2017   Procedure: TRANSESOPHAGEAL ECHOCARDIOGRAM (TEE);  Surgeon: Fay Records, MD;  Location: [facility];  Service: Cardiovascular;  Laterality: N/A;  . TEE WITHOUT CARDIOVERSION N/A 11/23/2017   Procedure: TRANSESOPHAGEAL ECHOCARDIOGRAM (TEE);  Surgeon: Gaye Pollack, MD;  Location: [facility];  Service: Open Heart Surgery;  Laterality: N/A;  . TUBAL LIGATION      There were no vitals filed for this visit.                          OT Short Term Goals - 04/22/19 1432      OT SHORT TERM GOAL #1   Title  Pt will be mod I with home activities program for RUE functional use - 03/14/2019    Status  Achieved      OT SHORT TERM GOAL #2   Title  Pt will demonstrate ability to incorporate RUE into self feeding activities (LTG moved to STG)    Status  Achieved      OT SHORT TERM GOAL #3   Title  Pt will demonstrate ability to use RUE as gross assist 25% of the time in basic self care (LTG moved to STG)    Status  Achieved  OT Long Term Goals - 04/22/19 1432      OT LONG TERM GOAL #1   Title  Pt will be independent in updated HEP including water based program for RUE -04/26/19 (renewed as pt adjusted frequency)    Status  Achieved      OT LONG TERM GOAL #2   Title  Pt will self-report able to complete morning ADL routine in 45 min or less.     Baseline  currently reports takes approx 1hr to perform     Status  Achieved      OT LONG TERM GOAL #3   Title  Pt will demonstrate ability to perform simple cold meal prep at mod independent.     Status  Deferred   per pt request     OT LONG TERM GOAL #4   Title  Pt will demonstrate ability to use RUE as gross assist 50% of the time within basic self care.     Status   Achieved      OT LONG TERM GOAL #5   Title  Pt will demonstrate ability for bil low reach with 50* shoulder flexion during functional activities.     Status  Achieved      OT LONG TERM GOAL #6   Title  Pt will demonstrate increased grip strength of 3lb or more in RUE to assist with bil reaching task.     Baseline  8lb     Status  Not Met   per dynamometer however pt can use L hand functionally     OT LONG TERM GOAL #7   Title  Pt will verbalize understanding for RUE positionig in bed in preparation for returning to sleeping in bed vs recliner.     Status  Achieved      OT LONG TERM GOAL #8   Title  Pt will be able to write a simple sentence with RUE with min compensation and AE prn    Status  Achieved      OT LONG TERM GOAL  #9   TITLE  Pt will demonstrate ability to incorporate RUE as gross assist during self feeding activities    Status  Achieved            Plan - 11/27/19 1051    Clinical Impression Statement  Pt was placed on hold to reserve remaining visits in 2020 and did not return will d/c from OT at this time       Patient will benefit from skilled therapeutic intervention in order to improve the following deficits and impairments:           Visit Diagnosis: Muscle weakness (generalized)    Problem List Patient Active Problem List   Diagnosis Date Noted  . Abdominal pain 11/07/2019  . Pancreatitis, recurrent 11/07/2019  . Hashimoto's thyroiditis 10/22/2019  . Thyroid function study abnormality 10/21/2019  . Pilonidal cyst 10/21/2019  . Irritation of ear, bilateral 10/21/2019  . Urinary incontinence 10/21/2019  . Acute recurrent pancreatitis 10/11/2019  . Gastritis 09/17/2019  . Pancreatic cyst 09/11/2019  . AAA (abdominal aortic aneurysm) ([place]), infrarenal - ulcerative plaque (3.1cm)- 09/10/2019 09/11/2019  . Abnormality of gait 08/27/2019  . Chronic rhinitis 07/09/2019  . Allergic reaction 07/09/2019  . 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  Carpal tunnel syndrome of left wrist 03/15/2019  . Anxiety 10/24/2018  . Numbness of left hand 10/19/2018  . Spastic hemiparesis affecting dominant side ([place]) 09/20/2018  . Primary osteoarthritis of both knees 07/04/2018  .  Iron deficiency 04/23/2018  . Atherosclerosis of aorta ([place]) 02/02/2018  . Osteoarthritis of left knee 01/16/2018  . Osteoarthritis of right knee 01/16/2018  . Sequela of cerebrovascular accident 01/16/2018  . Paroxysmal atrial fibrillation ([place]) 12/19/2017  . Chronic diastolic CHF (congestive heart failure) ([place]) 12/16/2017  . Benign essential HTN   . Supplemental oxygen dependent   . 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 Coronary artery disease 11/23/2017  . NSTEMI (non-ST elevated myocardial infarction) ([place]) 11/16/2017  . Urticaria 10/13/2017  . Former smoker 10/13/2017  . Angioedema   . 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 Cerebrovascular accident (CVA) due to occlusion of left middle cerebral artery ([place])   . Right hemiplegia ([place])   . Dysphagia, post-stroke   . Hyperlipidemia LDL goal <70    OCCUPATIONAL THERAPY DISCHARGE SUMMARY  Visits from Start of Care: 15  Current functional level related to goals / functional outcomes: See above   Remaining deficits: Hemiplegia, balance deficits, cognitive deficits   Education / Equipment: Land and water based HEP Plan: Patient agrees to discharge.  Patient goals were met. Patient is being discharged due to not returning since the last visit.  ?????      Quay Burow, OTR/L 11/27/2019, 10:52 AM  [facility] [address] [place], [place], [address] Phone: [phone]   Fax:  [phone]  Name: CASSANDRE OLEKSY MRN: [id number] Date of Birth: 03-22-1961

## 2019-11-28 ENCOUNTER — Other Ambulatory Visit (HOSPITAL_COMMUNITY)
Admission: RE | Admit: 2019-11-28 | Discharge: 2019-11-28 | Disposition: A | Discharge status: Home / Self Care | Payer: 59 | Source: Ambulatory Visit | Attending: Gastroenterology | Admitting: Gastroenterology

## 2019-11-28 ENCOUNTER — Other Ambulatory Visit: Payer: Self-pay

## 2019-11-28 ENCOUNTER — Encounter (HOSPITAL_COMMUNITY): Payer: Self-pay | Admitting: Gastroenterology

## 2019-11-28 DIAGNOSIS — K859 Acute pancreatitis without necrosis or infection, unspecified: Secondary | ICD-10-CM | POA: Diagnosis not present

## 2019-11-28 LAB — SARS CORONAVIRUS 2 (TAT 6-24 HRS): SARS Coronavirus 2: NEGATIVE

## 2019-11-28 NOTE — Telephone Encounter (Signed)
Called and spoke with patient-patient reports she has had abd pain for the last 3 days and has not been able to eat anything, is able to keep down po fluids, denies vomiting, took Zofran for nausea associated with increased pain/ abd pain has since resolved -patient still not able to eat anything without having abd pain  Patient is scheduled for EUS at [facility] with [name] Landmark on 12/02/2019;  Please Arnoldo Hooker

## 2019-11-28 NOTE — Telephone Encounter (Signed)
Called and spoke with patient-informed of MD recommendations and patient is agreeable with plan of care; patient reports she is not having pain at this time; lab orders placed in Epic; patient advised to complete lab work on 11/29/2019 if pain returns and to call the office to inform staff of return of symptoms; patient advised to keep scheduled appt for procedure on 12/02/2019; Patient verbalized understanding of information/instructions;   Patient advised to call back to the office at [phone] should questions/concerns arise;

## 2019-11-28 NOTE — Telephone Encounter (Signed)
Agree with supportive care with recommendations for fluids as tolerated. If pain still present, send for CBC, CMP, lipase. Keep EUS appt on 12/02/19

## 2019-11-28 NOTE — Progress Notes (Signed)
Pt denies SOB and chest pain. Pt stated that she is under the care of Dr. Domenic Polite, Cardiology, Dr. Curt Bears, Cardiology (EP) and Dr. Howard Pouch, PCP. Pt denies having a chest x ray in the last year. Pt denies recent labs. Pt made aware to stop taking vitamins, fish oil and herbal medications. Do not take any NSAIDs ie: Ibuprofen, Advil, Naproxen (Aleve), Motrin, BC and Goody Powder. Nurse asked pt when will she take last dose of Xarelto and pt stated Sunday night ( Surgery is Monday) . Then pt stated that she was instructed to stop taking Xarelto 24 hours prior to surgery. Nurse instructed pt to call surgeon's office for clarification on when to discontinue Xarelto; pt agreed to contact surgeon. Pt reminded to quarantine. Pt verbalized understanding of all pre-op instructions. PA, Anesthesiology, asked to review pt history.

## 2019-11-29 ENCOUNTER — Inpatient Hospital Stay (HOSPITAL_COMMUNITY)
Admission: EM | Admit: 2019-11-29 | Discharge: 2019-12-04 | DRG: 438 | Disposition: A | Discharge status: Home / Self Care | Payer: 59 | Attending: Internal Medicine | Admitting: Internal Medicine

## 2019-11-29 ENCOUNTER — Observation Stay (HOSPITAL_COMMUNITY): Payer: 59

## 2019-11-29 ENCOUNTER — Telehealth: Payer: Self-pay | Admitting: Gastroenterology

## 2019-11-29 ENCOUNTER — Other Ambulatory Visit: Payer: Self-pay

## 2019-11-29 ENCOUNTER — Emergency Department (HOSPITAL_COMMUNITY): Payer: 59

## 2019-11-29 ENCOUNTER — Encounter (HOSPITAL_COMMUNITY): Payer: Self-pay

## 2019-11-29 DIAGNOSIS — I5042 Chronic combined systolic (congestive) and diastolic (congestive) heart failure: Secondary | ICD-10-CM | POA: Diagnosis present

## 2019-11-29 DIAGNOSIS — Z8249 Family history of ischemic heart disease and other diseases of the circulatory system: Secondary | ICD-10-CM

## 2019-11-29 DIAGNOSIS — Z833 Family history of diabetes mellitus: Secondary | ICD-10-CM

## 2019-11-29 DIAGNOSIS — I899 Noninfective disorder of lymphatic vessels and lymph nodes, unspecified: Secondary | ICD-10-CM | POA: Diagnosis present

## 2019-11-29 DIAGNOSIS — M1990 Unspecified osteoarthritis, unspecified site: Secondary | ICD-10-CM | POA: Diagnosis present

## 2019-11-29 DIAGNOSIS — E785 Hyperlipidemia, unspecified: Secondary | ICD-10-CM | POA: Diagnosis present

## 2019-11-29 DIAGNOSIS — F17210 Nicotine dependence, cigarettes, uncomplicated: Secondary | ICD-10-CM | POA: Diagnosis present

## 2019-11-29 DIAGNOSIS — I10 Essential (primary) hypertension: Secondary | ICD-10-CM | POA: Diagnosis present

## 2019-11-29 DIAGNOSIS — I5032 Chronic diastolic (congestive) heart failure: Secondary | ICD-10-CM | POA: Diagnosis present

## 2019-11-29 DIAGNOSIS — R1013 Epigastric pain: Secondary | ICD-10-CM

## 2019-11-29 DIAGNOSIS — R531 Weakness: Secondary | ICD-10-CM

## 2019-11-29 DIAGNOSIS — I63512 Cerebral infarction due to unspecified occlusion or stenosis of left middle cerebral artery: Secondary | ICD-10-CM | POA: Diagnosis present

## 2019-11-29 DIAGNOSIS — R269 Unspecified abnormalities of gait and mobility: Secondary | ICD-10-CM

## 2019-11-29 DIAGNOSIS — K863 Pseudocyst of pancreas: Secondary | ICD-10-CM | POA: Diagnosis present

## 2019-11-29 DIAGNOSIS — I69351 Hemiplegia and hemiparesis following cerebral infarction affecting right dominant side: Secondary | ICD-10-CM

## 2019-11-29 DIAGNOSIS — Z6822 Body mass index (BMI) 22.0-22.9, adult: Secondary | ICD-10-CM

## 2019-11-29 DIAGNOSIS — Z20822 Contact with and (suspected) exposure to covid-19: Secondary | ICD-10-CM | POA: Diagnosis present

## 2019-11-29 DIAGNOSIS — S7001XA Contusion of right hip, initial encounter: Secondary | ICD-10-CM | POA: Diagnosis present

## 2019-11-29 DIAGNOSIS — D649 Anemia, unspecified: Secondary | ICD-10-CM | POA: Diagnosis present

## 2019-11-29 DIAGNOSIS — I700 Atherosclerosis of aorta: Secondary | ICD-10-CM | POA: Diagnosis present

## 2019-11-29 DIAGNOSIS — I714 Abdominal aortic aneurysm, without rupture: Secondary | ICD-10-CM | POA: Diagnosis present

## 2019-11-29 DIAGNOSIS — W19XXXA Unspecified fall, initial encounter: Secondary | ICD-10-CM | POA: Diagnosis present

## 2019-11-29 DIAGNOSIS — Y92009 Unspecified place in unspecified non-institutional (private) residence as the place of occurrence of the external cause: Secondary | ICD-10-CM

## 2019-11-29 DIAGNOSIS — K859 Acute pancreatitis without necrosis or infection, unspecified: Principal | ICD-10-CM | POA: Diagnosis present

## 2019-11-29 DIAGNOSIS — IMO0002 Reserved for concepts with insufficient information to code with codable children: Secondary | ICD-10-CM | POA: Diagnosis present

## 2019-11-29 DIAGNOSIS — Z951 Presence of aortocoronary bypass graft: Secondary | ICD-10-CM

## 2019-11-29 DIAGNOSIS — L509 Urticaria, unspecified: Secondary | ICD-10-CM | POA: Diagnosis present

## 2019-11-29 DIAGNOSIS — Z888 Allergy status to other drugs, medicaments and biological substances status: Secondary | ICD-10-CM

## 2019-11-29 DIAGNOSIS — I482 Chronic atrial fibrillation, unspecified: Secondary | ICD-10-CM | POA: Diagnosis present

## 2019-11-29 DIAGNOSIS — Z803 Family history of malignant neoplasm of breast: Secondary | ICD-10-CM

## 2019-11-29 DIAGNOSIS — E876 Hypokalemia: Secondary | ICD-10-CM | POA: Diagnosis not present

## 2019-11-29 DIAGNOSIS — K3189 Other diseases of stomach and duodenum: Secondary | ICD-10-CM | POA: Diagnosis present

## 2019-11-29 DIAGNOSIS — E43 Unspecified severe protein-calorie malnutrition: Secondary | ICD-10-CM

## 2019-11-29 DIAGNOSIS — Z8349 Family history of other endocrine, nutritional and metabolic diseases: Secondary | ICD-10-CM

## 2019-11-29 DIAGNOSIS — I25810 Atherosclerosis of coronary artery bypass graft(s) without angina pectoris: Secondary | ICD-10-CM | POA: Diagnosis present

## 2019-11-29 DIAGNOSIS — K228 Other specified diseases of esophagus: Secondary | ICD-10-CM | POA: Diagnosis present

## 2019-11-29 DIAGNOSIS — K2980 Duodenitis without bleeding: Secondary | ICD-10-CM | POA: Diagnosis present

## 2019-11-29 DIAGNOSIS — K861 Other chronic pancreatitis: Secondary | ICD-10-CM | POA: Diagnosis present

## 2019-11-29 DIAGNOSIS — K2950 Unspecified chronic gastritis without bleeding: Secondary | ICD-10-CM | POA: Diagnosis present

## 2019-11-29 DIAGNOSIS — Z79899 Other long term (current) drug therapy: Secondary | ICD-10-CM

## 2019-11-29 DIAGNOSIS — I255 Ischemic cardiomyopathy: Secondary | ICD-10-CM | POA: Diagnosis present

## 2019-11-29 DIAGNOSIS — I252 Old myocardial infarction: Secondary | ICD-10-CM

## 2019-11-29 DIAGNOSIS — I110 Hypertensive heart disease with heart failure: Secondary | ICD-10-CM | POA: Diagnosis present

## 2019-11-29 DIAGNOSIS — K838 Other specified diseases of biliary tract: Secondary | ICD-10-CM | POA: Diagnosis present

## 2019-11-29 DIAGNOSIS — Z7901 Long term (current) use of anticoagulants: Secondary | ICD-10-CM

## 2019-11-29 DIAGNOSIS — I480 Paroxysmal atrial fibrillation: Secondary | ICD-10-CM | POA: Diagnosis present

## 2019-11-29 DIAGNOSIS — I2510 Atherosclerotic heart disease of native coronary artery without angina pectoris: Secondary | ICD-10-CM | POA: Diagnosis present

## 2019-11-29 LAB — LIPASE, BLOOD: Lipase: 329 U/L — ABNORMAL HIGH (ref 11–51)

## 2019-11-29 LAB — CBC WITH DIFFERENTIAL/PLATELET
Abs Immature Granulocytes: 0.03 10*3/uL (ref 0.00–0.07)
Basophils Absolute: 0 10*3/uL (ref 0.0–0.1)
Basophils Relative: 0 %
Eosinophils Absolute: 0.3 10*3/uL (ref 0.0–0.5)
Eosinophils Relative: 2 %
HCT: 47.7 % — ABNORMAL HIGH (ref 36.0–46.0)
Hemoglobin: 15.5 g/dL — ABNORMAL HIGH (ref 12.0–15.0)
Immature Granulocytes: 0 %
Lymphocytes Relative: 10 %
Lymphs Abs: 1 10*3/uL (ref 0.7–4.0)
MCH: 32.2 pg (ref 26.0–34.0)
MCHC: 32.5 g/dL (ref 30.0–36.0)
MCV: 99 fL (ref 80.0–100.0)
Monocytes Absolute: 1 10*3/uL (ref 0.1–1.0)
Monocytes Relative: 10 %
Neutro Abs: 8.3 10*3/uL — ABNORMAL HIGH (ref 1.7–7.7)
Neutrophils Relative %: 78 %
Platelets: 354 10*3/uL (ref 150–400)
RBC: 4.82 MIL/uL (ref 3.87–5.11)
RDW: 13.9 % (ref 11.5–15.5)
WBC: 10.7 10*3/uL — ABNORMAL HIGH (ref 4.0–10.5)
nRBC: 0 % (ref 0.0–0.2)

## 2019-11-29 LAB — COMPREHENSIVE METABOLIC PANEL
ALT: 16 U/L (ref 0–44)
AST: 18 U/L (ref 15–41)
Albumin: 4.1 g/dL (ref 3.5–5.0)
Alkaline Phosphatase: 58 U/L (ref 38–126)
Anion gap: 11 (ref 5–15)
BUN: 12 mg/dL (ref 6–20)
CO2: 25 mmol/L (ref 22–32)
Calcium: 9.1 mg/dL (ref 8.9–10.3)
Chloride: 99 mmol/L (ref 98–111)
Creatinine, Ser: 0.56 mg/dL (ref 0.44–1.00)
GFR calc Af Amer: >60 mL/min (ref >60)
GFR calc non Af Amer: >60 mL/min (ref >60)
Glucose, Bld: 91 mg/dL (ref 70–99)
Potassium: 4 mmol/L (ref 3.5–5.1)
Sodium: 135 mmol/L (ref 135–145)
Total Bilirubin: 0.5 mg/dL (ref 0.3–1.2)
Total Protein: 7.2 g/dL (ref 6.5–8.1)

## 2019-11-29 LAB — SARS CORONAVIRUS 2 (TAT 6-24 HRS): SARS Coronavirus 2: NEGATIVE

## 2019-11-29 MED ORDER — HYDROXYZINE HCL 10 MG PO TABS
10.0000 mg | ORAL_TABLET | Freq: Every evening | ORAL | Status: DC | PRN
  Administered 2019-11-30 – 2019-12-01 (×2): 10 mg via ORAL
  Filled 2019-11-29 (×3): qty 1

## 2019-11-29 MED ORDER — PRAVASTATIN SODIUM 20 MG PO TABS
20.0000 mg | ORAL_TABLET | Freq: Every evening | ORAL | Status: DC
  Administered 2019-11-29 – 2019-12-03 (×5): 20 mg via ORAL
  Filled 2019-11-29 (×5): qty 1

## 2019-11-29 MED ORDER — ONDANSETRON HCL 4 MG/2ML IJ SOLN
4.0000 mg | Freq: Once | INTRAMUSCULAR | Status: AC
  Administered 2019-11-29: 4 mg via INTRAVENOUS
  Filled 2019-11-29: qty 2

## 2019-11-29 MED ORDER — ONDANSETRON HCL 4 MG PO TABS: 4.0000 mg | ORAL_TABLET | Freq: Four times a day (QID) | ORAL | Status: DC | PRN

## 2019-11-29 MED ORDER — FERROUS SULFATE 325 (65 FE) MG PO TABS: 325.0000 mg | ORAL_TABLET | ORAL | Status: DC

## 2019-11-29 MED ORDER — ONDANSETRON HCL 4 MG/2ML IJ SOLN
4.0000 mg | Freq: Four times a day (QID) | INTRAMUSCULAR | Status: DC | PRN
  Administered 2019-11-30: 4 mg via INTRAVENOUS
  Filled 2019-11-29: qty 2

## 2019-11-29 MED ORDER — FAMOTIDINE 20 MG PO TABS: 20.0000 mg | ORAL_TABLET | Freq: Every day | ORAL | Status: DC

## 2019-11-29 MED ORDER — CARVEDILOL 3.125 MG PO TABS
3.1250 mg | ORAL_TABLET | Freq: Two times a day (BID) | ORAL | Status: DC
  Administered 2019-11-29 – 2019-12-04 (×10): 3.125 mg via ORAL
  Filled 2019-11-29 (×10): qty 1

## 2019-11-29 MED ORDER — SODIUM CHLORIDE 0.9 % IV BOLUS
1000.0000 mL | Freq: Once | INTRAVENOUS | Status: AC
  Administered 2019-11-29: 1000 mL via INTRAVENOUS

## 2019-11-29 MED ORDER — IOHEXOL 300 MG/ML  SOLN
100.0000 mL | Freq: Once | INTRAMUSCULAR | Status: AC | PRN
  Administered 2019-11-29: 100 mL via INTRAVENOUS

## 2019-11-29 MED ORDER — SODIUM CHLORIDE 0.9 % IV SOLN: INTRAVENOUS | Status: DC

## 2019-11-29 MED ORDER — SODIUM CHLORIDE (PF) 0.9 % IJ SOLN
INTRAMUSCULAR | Status: AC
  Filled 2019-11-29: qty 50

## 2019-11-29 MED ORDER — PANTOPRAZOLE SODIUM 40 MG IV SOLR
40.0000 mg | Freq: Two times a day (BID) | INTRAVENOUS | Status: DC
  Administered 2019-11-29 – 2019-12-01 (×4): 40 mg via INTRAVENOUS
  Filled 2019-11-29 (×4): qty 40

## 2019-11-29 MED ORDER — MORPHINE SULFATE (PF) 4 MG/ML IV SOLN
4.0000 mg | Freq: Once | INTRAVENOUS | Status: AC
  Administered 2019-11-29: 4 mg via INTRAVENOUS
  Filled 2019-11-29: qty 1

## 2019-11-29 MED ORDER — MORPHINE SULFATE (PF) 2 MG/ML IV SOLN
1.0000 mg | INTRAVENOUS | Status: DC | PRN
  Administered 2019-11-29 – 2019-11-30 (×2): 2 mg via INTRAVENOUS
  Administered 2019-11-30: 1 mg via INTRAVENOUS
  Administered 2019-11-30 – 2019-12-03 (×7): 2 mg via INTRAVENOUS
  Filled 2019-11-29 (×10): qty 1

## 2019-11-29 MED ORDER — ENOXAPARIN SODIUM 60 MG/0.6ML ~~LOC~~ SOLN
1.0000 mg/kg | Freq: Two times a day (BID) | SUBCUTANEOUS | Status: AC
  Administered 2019-11-29 – 2019-11-30 (×3): 55 mg via SUBCUTANEOUS
  Filled 2019-11-29 (×4): qty 0.6

## 2019-11-29 NOTE — ED Provider Notes (Signed)
[facility] DEPT Provider Note   CSN: [id number] Arrival date & time: 11/29/19  [id number]     History Chief Complaint  Patient presents with  . Fall  . Abdominal Pain  . loss of balance    [name] is a 59 y.o. female.  She has multiple medical problems including coronary disease, stroke, pancreatitis.  She is complaining of upper abdominal pain right greater than left that started 3 days ago similar to her prior pancreatitis episodes.  She rates the pain is severe in nature.  Associated with nausea.  Poor p.o. intake.  She said since yesterday she is also been dizzy lightheaded when she stands up and has had 2 falls.  She denies head strike or loss of consciousness.  She feels weaker on the right side arm and leg, this is a side where she had her prior stroke deficits.  No fevers or chills.  No vomiting no diarrhea.  No urinary symptoms.  The history is provided by the patient.  Fall This is a new problem. The current episode started yesterday. The problem occurs rarely. The problem has not changed since onset.Associated symptoms include abdominal pain. Pertinent negatives include no chest pain, no headaches and no shortness of breath. Nothing aggravates the symptoms. Nothing relieves the symptoms. She has tried nothing for the symptoms. The treatment provided no relief.  Abdominal Pain Pain location:  RUQ Pain quality: aching   Onset quality:  Gradual Duration:  3 days Timing:  Constant Progression:  Worsening Chronicity:  Recurrent Context: not alcohol use and not recent travel   Relieved by:  Nothing Worsened by:  Nothing Ineffective treatments:  None tried Associated symptoms: constipation and nausea   Associated symptoms: no chest pain, no cough, no diarrhea, no dysuria, no fever, no hematemesis, no hematochezia, no hematuria, no shortness of breath, no sore throat and no vomiting        Past Medical History:  Diagnosis Date  .  Angio-edema   . Arthritis   . CAD (coronary artery disease)    a. s/p NSTEMI in 10/2017 with cath showing LM disease --> s/p CABG on 11/23/2017 with LIMA-LAD and SVG-OM.   [name] Epigastric pain    mid  . Hay fever   . Ischemic cardiomyopathy    a. EF 35-40% by echo in 10/2017  . NSTEMI (non-ST elevated myocardial infarction) ([place])   . PAF (paroxysmal atrial fibrillation) ([place])    Diagnosed by ILR  . Pancreatitis   . Pneumonia 11/16/2017  . Stroke [facility]) 08/23/2017   Left MCA infarct status post TPA and thrombectomy  . Urticaria     Patient Active Problem List   Diagnosis Date Noted  . Abdominal pain 11/07/2019  . Pancreatitis, recurrent 11/07/2019  . Hashimoto's thyroiditis 10/22/2019  . Thyroid function study abnormality 10/21/2019  . Pilonidal cyst 10/21/2019  . Irritation of ear, bilateral 10/21/2019  . Urinary incontinence 10/21/2019  . Acute recurrent pancreatitis 10/11/2019  . Gastritis 09/17/2019  . Pancreatic cyst 09/11/2019  . AAA (abdominal aortic aneurysm) ([place]), infrarenal - ulcerative plaque (3.1cm)- 09/10/2019 09/11/2019  . Abnormality of gait 08/27/2019  . Chronic rhinitis 07/09/2019  . Allergic reaction 07/09/2019  . 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 Carpal tunnel syndrome of left wrist 03/15/2019  . Anxiety 10/24/2018  . Numbness of left hand 10/19/2018  . Spastic hemiparesis affecting dominant side ([place]) 09/20/2018  . Primary osteoarthritis of both knees 07/04/2018  . Iron deficiency 04/23/2018  . Atherosclerosis of aorta (  [employer]) 02/02/2018  . Osteoarthritis of left knee 01/16/2018  . Osteoarthritis of right knee 01/16/2018  . Sequela of cerebrovascular accident 01/16/2018  . Paroxysmal atrial fibrillation ([place]) 12/19/2017  . Chronic diastolic CHF (congestive heart failure) ([place]) 12/16/2017  . Benign essential HTN   . Supplemental oxygen dependent   . 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 Coronary artery disease 11/23/2017  . NSTEMI (non-ST elevated myocardial  infarction) ([place]) 11/16/2017  . Urticaria 10/13/2017  . Former smoker 10/13/2017  . Angioedema   . 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 Cerebrovascular accident (CVA) due to occlusion of left middle cerebral artery ([place])   . Right hemiplegia ([place])   . Dysphagia, post-stroke   . Hyperlipidemia LDL goal <70     Past Surgical History:  Procedure Laterality Date  . CORONARY ARTERY BYPASS GRAFT N/A 11/23/2017   Procedure: CORONARY ARTERY BYPASS GRAFTING (CABG) x 2, ON PUMP, USING LEFT INTERNAL MAMMARY ARTERY AND RIGHT GREATER SAPHENOUS VEIN HARVESTED ENDOSCOPICALLY;  Surgeon: Gaye Pollack, MD;  Location: [facility];  Service: Open Heart Surgery;  Laterality: N/A;  . CRYOABLATION     cervical  . IR ANGIO INTRA EXTRACRAN SEL COM CAROTID INNOMINATE UNI R MOD SED  08/21/2017  . IR ANGIO VERTEBRAL SEL SUBCLAVIAN INNOMINATE UNI R MOD SED  08/21/2017  . IR PERCUTANEOUS ART THROMBECTOMY/INFUSION INTRACRANIAL INC DIAG ANGIO  08/21/2017  . IR RADIOLOGIST EVAL & MGMT  10/30/2017  . LOOP RECORDER INSERTION N/A 08/28/2017   Procedure: LOOP RECORDER INSERTION;  Surgeon: Constance Haw, MD;  Location: [facility];  Service: Cardiovascular;  Laterality: N/A;  . RADIOLOGY WITH ANESTHESIA N/A 08/21/2017   Procedure: RADIOLOGY WITH ANESTHESIA;  Surgeon: Luanne Bras, MD;  Location: [facility];  Service: Radiology;  Laterality: N/A;  . RIGHT/LEFT HEART CATH AND CORONARY ANGIOGRAPHY N/A 11/20/2017   Procedure: RIGHT/LEFT HEART CATH AND CORONARY ANGIOGRAPHY;  Surgeon: Jettie Booze, MD;  Location: [facility];  Service: Cardiovascular;  Laterality: N/A;  . TEE WITHOUT CARDIOVERSION N/A 08/28/2017   Procedure: TRANSESOPHAGEAL ECHOCARDIOGRAM (TEE);  Surgeon: Fay Records, MD;  Location: [facility];  Service: Cardiovascular;  Laterality: N/A;  . TEE WITHOUT CARDIOVERSION N/A 11/23/2017   Procedure: TRANSESOPHAGEAL ECHOCARDIOGRAM (TEE);  Surgeon: Gaye Pollack, MD;  Location:  [facility];  Service: Open Heart Surgery;  Laterality: N/A;  . TUBAL LIGATION       OB History    Gravida  2   Para  2   Term      Preterm      AB      Living  2     SAB      TAB      Ectopic      Multiple      Live Births              Family History  Problem Relation Age of Onset  . Heart attack Father   . Hypertension Mother   . Hyperlipidemia Mother   . Diabetes type II Mother   . Urticaria Mother   . Hypertension Brother   . Breast cancer Cousin 69  . Immunodeficiency Neg Hx   . Eczema Neg Hx   . Atopy Neg Hx   . Asthma Neg Hx   . Angioedema Neg Hx   . Allergic rhinitis Neg Hx   . Colon cancer Neg Hx   . Esophageal cancer Neg Hx   . Stomach cancer Neg Hx   .  Pancreatic cancer Neg Hx   . Colon polyps Neg Hx   . Esophageal varices Neg Hx     Social History   Tobacco Use  . Smoking status: Current Some Day Smoker    Packs/day: 0.10    Years: 37.00    Pack years: 3.70    Types: Cigarettes  . Smokeless tobacco: Never Used  Substance Use Topics  . Alcohol use: No  . Drug use: No    Home Medications Prior to Admission medications   Medication Sig Start Date End Date Taking? Authorizing Provider  acetaminophen (TYLENOL) 325 MG tablet Take 2 tablets (650 mg total) by mouth every 6 (six) hours as needed for mild pain or moderate pain (or Fever >/= 101). 09/13/19   Rai, Ripudeep K, MD  azelastine (ASTELIN) 0.1 % nasal spray Place 1-2 sprays into both nostrils 2 (two) times daily as needed for rhinitis. Use in each nostril as directed 09/17/19   Bobbitt, Sedalia Muta, MD  baclofen (LIORESAL) 10 MG tablet Take 10 mg by mouth in the morning and at bedtime.    [provider]  calcium carbonate (TUMS - DOSED IN MG ELEMENTAL CALCIUM) 500 MG chewable tablet Chew 1 tablet by mouth 2 (two) times daily.     [provider]  carvedilol (COREG) 3.125 MG tablet Take 1 tablet by mouth twice daily Patient taking differently: Take 3.125 mg by mouth  in the morning and at bedtime.  09/02/19   Satira Sark, MD  cholecalciferol (VITAMIN D) 1000 units tablet Take 1,000 Units by mouth daily.    [provider]  famotidine (PEPCID) 20 MG tablet Take 20 mg by mouth daily.     [provider]  ferrous sulfate 325 (65 FE) MG tablet Take 325 mg by mouth 2 (two) times a week. Mondays  and Thursdays.    [provider]  hydrOXYzine (ATARAX/VISTARIL) 10 MG tablet Take 1 tablet (10 mg total) by mouth at bedtime. 07/01/19   [name], [name], DO  levocetirizine (XYZAL) 5 MG tablet Take 1 tablet (5 mg total) by mouth every evening. 09/17/19   Bobbitt, Sedalia Muta, MD  mupirocin cream (BACTROBAN) 2 % Apply 1 application topically 2 (two) times daily. Apply to area on buttocks. Patient taking differently: Apply 1 application topically 2 (two) times daily as needed (rash buttocks.).  10/21/19   [name], [name], DO  ondansetron (ZOFRAN ODT) 4 MG disintegrating tablet Take 1 tablet (4 mg total) by mouth every 8 (eight) hours as needed for nausea or vomiting. 11/09/19   Rai, Ripudeep K, MD  pravastatin (PRAVACHOL) 20 MG tablet Take 1 tablet (20 mg total) by mouth daily. Patient taking differently: Take 20 mg by mouth every evening.  10/17/19   Ahmed Prima, Fransisco Hertz, PA-C  rivaroxaban (XARELTO) 20 MG TABS tablet Take 1 tablet daily with evening meal 09/25/19   Satira Sark, MD  traMADol (ULTRAM) 50 MG tablet Take 2 tablets (100 mg total) by mouth every 6 (six) hours as needed for moderate pain. Patient taking differently: Take 50 mg by mouth at bedtime.  11/09/19   Rai, Vernelle Emerald, MD  triamcinolone cream (KENALOG) 0.1 % Apply 1 application topically 2 (two) times daily. 10/21/19   [name], [name], DO  vitamin B-12 (CYANOCOBALAMIN) 100 MCG tablet Take 100 mcg by mouth daily.    [provider]    Allergies    Lopressor [metoprolol tartrate], Allegra allergy [fexofenadine hcl], Dilaudid [hydromorphone], Nsaids, Gabapentin  (once-daily), and  Lipitor [atorvastatin]  Review of Systems   Review of Systems  Constitutional: Negative for fever.  HENT: Negative for sore throat.   Eyes: Negative for visual disturbance.  Respiratory: Negative for cough and shortness of breath.   Cardiovascular: Negative for chest pain.  Gastrointestinal: Positive for abdominal pain, constipation and nausea. Negative for diarrhea, hematemesis, hematochezia and vomiting.  Genitourinary: Negative for dysuria and hematuria.  Musculoskeletal: Negative for neck pain.  Skin: Negative for rash.  Neurological: Positive for weakness and numbness. Negative for headaches.    Physical Exam Updated Vital Signs BP (!) 137/96 (BP Location: Left Arm)   Pulse (!) 112   Temp 98.2 F (36.8 C) (Oral)   Resp 16   Ht 5' (1.524 m)   Wt 53.1 kg   LMP  (LMP Unknown) Comment: at age 40  SpO2 97%   BMI 22.85 kg/m   Physical Exam Vitals and nursing note reviewed.  Constitutional:      General: She is not in acute distress.    Appearance: She is well-developed.  HENT:     Head: Normocephalic and atraumatic.  Eyes:     Conjunctiva/sclera: Conjunctivae normal.  Cardiovascular:     Rate and Rhythm: Normal rate and regular rhythm.     Heart sounds: No murmur.  Pulmonary:     Effort: Pulmonary effort is normal. No respiratory distress.     Breath sounds: Normal breath sounds.  Abdominal:     Palpations: Abdomen is soft.     Tenderness: There is abdominal tenderness in the right upper quadrant, epigastric area and left upper quadrant. There is no guarding or rebound.  Musculoskeletal:        General: Tenderness and signs of injury present. No deformity.     Cervical back: Neck supple.     Comments: Tenderness and bruising right lateral hip.  Full range of motion.  Skin:    General: Skin is warm and dry.     Capillary Refill: Capillary refill takes less than 2 seconds.  Neurological:     Mental Status: She is alert and oriented to person,  place, and time.     ED Results / Procedures / Treatments   Labs (all labs ordered are listed, but only abnormal results are displayed) Labs Reviewed  LIPASE, BLOOD - Abnormal; Notable for the following components:      Result Value   Lipase 329 (*)    All other components within normal limits  CBC WITH DIFFERENTIAL/PLATELET - Abnormal; Notable for the following components:   WBC 10.7 (*)    Hemoglobin 15.5 (*)    HCT 47.7 (*)    Neutro Abs 8.3 (*)    All other components within normal limits  SARS CORONAVIRUS 2 (TAT 6-24 HRS)  COMPREHENSIVE METABOLIC PANEL  COMPREHENSIVE METABOLIC PANEL  CBC  LIPASE, BLOOD    EKG EKG Interpretation  Date/Time:  Friday November 29 2019 08:59:50 EST Ventricular Rate:  92 PR Interval:    QRS Duration: 92 QT Interval:  341 QTC Calculation: 422 R Axis:   39 Text Interpretation: Sinus rhythm Ventricular premature complex Consider left atrial enlargement Borderline T wave abnormalities No significant change since 12/20 Confirmed by Aletta Edouard [phone]) on 11/29/2019 9:12:41 AM   Radiology CT Head Wo Contrast  Result Date: 11/29/2019 CLINICAL DATA:  Right-sided weakness.  Fall. EXAM: CT HEAD WITHOUT CONTRAST TECHNIQUE: Contiguous axial images were obtained from the base of the skull through the vertex without intravenous contrast. COMPARISON:  09/13/2019 FINDINGS: Brain:  Evidence of old infarct in the left insular region, left parietal lobe and left temporal lobe. This is stable. No acute infarct or hemorrhage. No hydrocephalus. Vascular: No hyperdense vessel or unexpected calcification. Skull: No acute calvarial abnormality. Sinuses/Orbits: Visualized paranasal sinuses and mastoids clear. Orbital soft tissues unremarkable. Other: None IMPRESSION: Evidence of old left MCA infarct, stable. No acute intracranial abnormality. Electronically Signed   By: Rolm Baptise M.D.   On: 11/29/2019 10:24   MR BRAIN WO CONTRAST  Result Date: 11/29/2019 CLINICAL  DATA:  Worsening right-sided weakness EXAM: MRI HEAD WITHOUT CONTRAST TECHNIQUE: Multiplanar, multiecho pulse sequences of the brain and surrounding structures were obtained without intravenous contrast. COMPARISON:  2018 FINDINGS: Brain: There is no acute infarction or intracranial hemorrhage. There is no intracranial mass, mass effect, or edema. There is no hydrocephalus or extra-axial fluid collection. There are small chronic cortical/subcortical infarcts involving the left frontal and parietal lobes. There is a chronic infarct involving the left basal ganglia and corona radiata with extension into the temporal lobe. Minor chronic blood products associated with some of these infarcts. Additional patchy foci of T2 hyperintensity in the supratentorial white matter are nonspecific but likely reflect mild chronic microvascular ischemic changes. There is ex vacuo dilatation of the left lateral ventricle. Vascular: Major vessel flow voids at the skull base are preserved. Skull and upper cervical spine: Normal marrow signal is preserved. Sinuses/Orbits: Paranasal sinuses are aerated. Orbits are unremarkable. Other: Sella is unremarkable. Mastoid air cells are clear. IMPRESSION: No acute infarction. Chronic infarcts as described above. Mild chronic microvascular ischemic changes. Electronically Signed   By: Macy Mis M.D.   On: 11/29/2019 13:13   CT Abdomen Pelvis W Contrast  Result Date: 11/29/2019 CLINICAL DATA:  Abdominal pain, nausea. EXAM: CT ABDOMEN AND PELVIS WITH CONTRAST TECHNIQUE: Multidetector CT imaging of the abdomen and pelvis was performed using the standard protocol following bolus administration of intravenous contrast. CONTRAST:  163mL OMNIPAQUE IOHEXOL 300 MG/ML  SOLN COMPARISON:  11/06/2019 FINDINGS: Lower chest: Linear bibasilar scarring. Heart is mildly enlarged. No effusions. Hepatobiliary: No focal hepatic abnormality. Gallbladder unremarkable. Pancreas: Previously seen stranding around  the pancreatic head and uncinate process have improved. Cystic mass within the pancreatic head measures 2.1 cm, slightly enlarged since prior study when this measured 1.8 cm. No ductal dilatation. Spleen: No focal abnormality.  Normal size. Adrenals/Urinary Tract: Bilateral nephrolithiasis. No hydronephrosis. Small nodule in the left adrenal gland is stable. 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 Small bilateral hypodensities in the kidneys, likely cysts, stable, too small to characterize. Proximal right ureteral stone noted, 6 mm. This is stable when compared to prior study. No associated hydronephrosis. Urinary bladder is unremarkable. Stomach/Bowel: Scattered left colonic diverticulosis. No active diverticulitis. Normal appendix. Stomach is decompressed. There is wall thickening involving the 1st and 2nd portions of the duodenum with surrounding inflammation/stranding. This is concerning for duodenitis. Remainder of small bowel is decompressed and unremarkable. Vascular/Lymphatic: Heavily calcified aorta. 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 Ulcerative plaque seen in several areas, stable. Reproductive: Uterus and adnexa unremarkable.  No mass. Other: No free fluid or free air. Musculoskeletal: No acute bony abnormality. IMPRESSION: The previously seen inflammation/stranding around the pancreatic head and uncinate process has decreased. Inflammation in the area now appears to be centered around the 1st and 2nd portions of the duodenum with associated duodenal wall thickening. Findings concerning for duodenitis. Essentially stable pancreatic head  pseudocyst. Left colonic diverticulosis. No active diverticulitis. Irregular aortic atherosclerosis with altered plaque and aneurysmal dilatation, 3.1 cm maximally. Electronically Signed   By: Rolm Baptise M.D.   On: 11/29/2019 10:30   DG Hip Unilat With Pelvis  2-3 Views Right  Result Date: 11/29/2019 CLINICAL DATA:  Right hip pain secondary to a fall yesterday. EXAM: DG HIP (WITH OR WITHOUT PELVIS) 2-3V RIGHT COMPARISON:  CT scan of the abdomen and pelvis dated 11/07/2019 FINDINGS: There is no evidence of hip fracture or dislocation. Minimal osteophyte formation on the superolateral aspect of the right acetabulum. IMPRESSION: No acute abnormality. Electronically Signed   By: Lorriane Shire M.D.   On: 11/29/2019 12:35    Procedures Procedures (including critical care time)  Medications Ordered in ED Medications  sodium chloride 0.9 % bolus 1,000 mL (has no administration in time range)  ondansetron (ZOFRAN) injection 4 mg (has no administration in time range)  morphine 4 MG/ML injection 4 mg (has no administration in time range)    ED Course  I have reviewed the triage vital signs and the nursing notes.  Pertinent labs & imaging results that were available during my care of the patient were reviewed by me and considered in my medical decision making (see chart for details).  Clinical Course as of Nov 28 1241  Fri Nov 29, 2019  0901 Differential includes pancreatitis, colitis, diverticulitis, cholelithiasis, AAA, peptic ulcer disease   [MB]  1127 Patient's lipase is up at 329.  Her CT was read as improved inflammation of the pancreas but new inflammation of the duodenum.  Likely reactive secondary to some pancreatitis.  Discussed with low-power GI Dr. Fuller Plan who will consult on the patient.   [MB]    Clinical Course User Index [MB] Hayden Rasmussen, MD   MDM Rules/Calculators/A&P                       Final Clinical Impression(s) / ED Diagnoses Final diagnoses:  Acute pancreatitis, unspecified complication status, unspecified pancreatitis type  Fall in home, initial encounter  Contusion of right hip, initial encounter  Weakness generalized    Rx / DC Orders ED Discharge Orders    None       Hayden Rasmussen, MD 11/29/19  1757

## 2019-11-29 NOTE — Progress Notes (Signed)
carelink set up for EGD/EUS Monday at 1230 at [place]. Requested 1130 arrival time.

## 2019-11-29 NOTE — Progress Notes (Signed)
ANTICOAGULATION CONSULT NOTE - Initial Consult  Pharmacy Consult for Lovenox - transition off Xarelto while inpatient Indication: atrial fibrillation  Allergies  Allergen Reactions  . Lopressor [Metoprolol Tartrate] Swelling    Angioedema  . Allegra Allergy [Fexofenadine Hcl] Hives  . Dilaudid [Hydromorphone]     Hallucinations  . Nsaids     On Xarelto  . Gabapentin (Once-Daily) Rash  . Lipitor [Atorvastatin] Diarrhea    Patient Measurements: Height: 5' (152.4 cm) Weight: 117 lb (53.1 kg) IBW/kg (Calculated) : 45.5   Vital Signs: Temp: 98.2 F (36.8 C) (03/05 0838) Temp Source: Oral (03/05 0838) BP: 103/61 (03/05 1200) Pulse Rate: 94 (03/05 1200)  Labs: Recent Labs    11/29/19 0909  HGB 15.5*  HCT 47.7*  PLT 354  CREATININE 0.56    Estimated Creatinine Clearance: 54.4 mL/min (by C-G formula based on SCr of 0.56 mg/dL).   Medical History: Past Medical History:  Diagnosis Date  . Angio-edema   . Arthritis   . CAD (coronary artery disease)    a. s/p NSTEMI in 10/2017 with cath showing LM disease --> s/p CABG on 11/23/2017 with LIMA-LAD and SVG-OM.   [name] Epigastric pain    mid  . Hay fever   . Ischemic cardiomyopathy    a. EF 35-40% by echo in 10/2017  . NSTEMI (non-ST elevated myocardial infarction) ([place])   . PAF (paroxysmal atrial fibrillation) ([place])    Diagnosed by ILR  . Pancreatitis   . Pneumonia 11/16/2017  . Stroke [facility]) 08/23/2017   Left MCA infarct status post TPA and thrombectomy  . Urticaria     Medications:  Scheduled:  . sodium chloride (PF)       Infusions:    Assessment: 59 yo female admitted after fall, abd pain with hx CVA, CAD, HF, AFib on Xarelto PTA. Plan per Md to transition to Lovenox during hospital stay if procedure is planned by GI. Baseline Hgb 15.5, Plts 354, SCr 0.56. Last dose of Xarelto 20mg  daily per PTA med list was 3/4 at 1800; therefore, Lovenox will not be to be given until 1800 today 3/5  Goal of Therapy:   Monitor CBC, SCr, signs/symptoms bleeding antiXa level 0.6-1 Monitor platelets by anticoagulation protocol: Yes   Plan:  1) at 1800, start Lovenox 1mg /kg SQ q12 2) Daily CBC, SCr  Kara Mead 11/29/2019,12:28 PM

## 2019-11-29 NOTE — H&P (Signed)
TRH H&P   Patient Demographics:    [name], is a 59 y.o. female  MRN: [id number]   DOB - 09-Feb-1961  Admit Date - 11/29/2019  Outpatient Primary MD for the patient is [name], DO  Referring MD/NP/PA: Dr Melina Copa.  Outpatient Specialists: Dr Bryan Lemma  Patient coming from: Home  Chief Complaint  Patient presents with  . Fall  . Abdominal Pain  . loss of balance      HPI:    [name]  is a 59 y.o. female, 59 year old female with a history of CVA, CAD/two-vessel CABG, CHF, atrial fibrillation (on Xarelto),with recent hospitalization with acute pancreatitis, with no clear etiology, she presents to ED today with multiple complaints, including mainly generalized weakness, lightheadedness and fall, as well complains of abdominal pain, poor appetite, some nausea but no vomiting, patient with history of CVA with mild right-sided weakness, reports has been feeling her symptoms worse over last 24 hours since has not her fall yesterday, otherwise she denies any focal deficits, no tingling, no numbness, no slurred speech or altered mental status, reports abdominal pain has resolved since recent discharge, but reoccurred yesterday, with some nausea, but no vomiting.  In ED work-up significant for lipase of 329, white blood cell count of 10.7, significant for improvement in pancreatic head inflammation, but evidence of new inflammation in the first and second portion of the duodenum concerning for duodenitis.   Review of systems:    In addition to the HPI above,  No Fever-chills, No Headache, No changes with Vision or hearing, No problems swallowing food or Liquids, No Chest pain, Cough or Shortness of Breath, Planes of abdominal pain, nausea, denies any vomiting,, Bowel movements are regular, No Blood in stool or Urine, No dysuria, No new skin rashes or bruises, No  new joints pains-aches,  She reports worsening right-sided weakness No recent weight gain or loss, No polyuria, polydypsia or polyphagia, No significant Mental Stressors.  A full 10 point Review of Systems was done, except as stated above, all other Review of Systems were negative.   With Past History of the following :    Past Medical History:  Diagnosis Date  . Angio-edema   . Arthritis   . CAD (coronary artery disease)    a. s/p NSTEMI in 10/2017 with cath showing LM disease --> s/p CABG on 11/23/2017 with LIMA-LAD and SVG-OM.   [name] Epigastric pain    mid  . Hay fever   . Ischemic cardiomyopathy    a. EF 35-40% by echo in 10/2017  . NSTEMI (non-ST elevated myocardial infarction) ([place])   . PAF (paroxysmal atrial fibrillation) ([place])    Diagnosed by ILR  . Pancreatitis   . Pneumonia 11/16/2017  . Stroke [facility]) 08/23/2017   Left MCA infarct status post TPA and thrombectomy  . Urticaria       Past Surgical History:  Procedure Laterality  Date  . CORONARY ARTERY BYPASS GRAFT N/A 11/23/2017   Procedure: CORONARY ARTERY BYPASS GRAFTING (CABG) x 2, ON PUMP, USING LEFT INTERNAL MAMMARY ARTERY AND RIGHT GREATER SAPHENOUS VEIN HARVESTED ENDOSCOPICALLY;  Surgeon: Gaye Pollack, MD;  Location: [facility];  Service: Open Heart Surgery;  Laterality: N/A;  . CRYOABLATION     cervical  . IR ANGIO INTRA EXTRACRAN SEL COM CAROTID INNOMINATE UNI R MOD SED  08/21/2017  . IR ANGIO VERTEBRAL SEL SUBCLAVIAN INNOMINATE UNI R MOD SED  08/21/2017  . IR PERCUTANEOUS ART THROMBECTOMY/INFUSION INTRACRANIAL INC DIAG ANGIO  08/21/2017  . IR RADIOLOGIST EVAL & MGMT  10/30/2017  . LOOP RECORDER INSERTION N/A 08/28/2017   Procedure: LOOP RECORDER INSERTION;  Surgeon: Constance Haw, MD;  Location: [place] [facility];  Service: Cardiovascular;  Laterality: N/A;  . RADIOLOGY WITH ANESTHESIA N/A 08/21/2017   Procedure: RADIOLOGY WITH ANESTHESIA;  Surgeon: Luanne Bras, MD;  Location: [place];  Service:  Radiology;  Laterality: N/A;  . RIGHT/LEFT HEART CATH AND CORONARY ANGIOGRAPHY N/A 11/20/2017   Procedure: RIGHT/LEFT HEART CATH AND CORONARY ANGIOGRAPHY;  Surgeon: Jettie Booze, MD;  Location: [place] [facility];  Service: Cardiovascular;  Laterality: N/A;  . TEE WITHOUT CARDIOVERSION N/A 08/28/2017   Procedure: TRANSESOPHAGEAL ECHOCARDIOGRAM (TEE);  Surgeon: Fay Records, MD;  Location: [place];  Service: Cardiovascular;  Laterality: N/A;  . TEE WITHOUT CARDIOVERSION N/A 11/23/2017   Procedure: TRANSESOPHAGEAL ECHOCARDIOGRAM (TEE);  Surgeon: Gaye Pollack, MD;  Location: [facility];  Service: Open Heart Surgery;  Laterality: N/A;  . TUBAL LIGATION        Social History:     Social History   Tobacco Use  . Smoking status: Current Some Day Smoker    Packs/day: 0.10    Years: 37.00    Pack years: 3.70    Types: Cigarettes  . Smokeless tobacco: Never Used  Substance Use Topics  . Alcohol use: No      Family History :     Family History  Problem Relation Age of Onset  . Heart attack Father   . Hypertension Mother   . Hyperlipidemia Mother   . Diabetes type II Mother   . Urticaria Mother   . Hypertension Brother   . Breast cancer Cousin 73  . Immunodeficiency Neg Hx   . Eczema Neg Hx   . Atopy Neg Hx   . Asthma Neg Hx   . Angioedema Neg Hx   . Allergic rhinitis Neg Hx   . Colon cancer Neg Hx   . Esophageal cancer Neg Hx   . Stomach cancer Neg Hx   . Pancreatic cancer Neg Hx   . Colon polyps Neg Hx   . Esophageal varices Neg Hx       Home Medications:   Prior to Admission medications   Medication Sig Start Date End Date Taking? Authorizing Provider  acetaminophen (TYLENOL) 325 MG tablet Take 2 tablets (650 mg total) by mouth every 6 (six) hours as needed for mild pain or moderate pain (or Fever >/= 101). 09/13/19  Yes Rai, Ripudeep K, MD  baclofen (LIORESAL) 10 MG tablet Take 10 mg by mouth in the morning and at bedtime.   Yes [provider]    calcium carbonate (TUMS - DOSED IN MG ELEMENTAL CALCIUM) 500 MG chewable tablet Chew 1 tablet by mouth 2 (two) times daily.    Yes [provider]  carvedilol (COREG) 3.125 MG tablet Take 1 tablet by mouth twice  daily Patient taking differently: Take 3.125 mg by mouth in the morning and at bedtime.  09/02/19  Yes Satira Sark, MD  cholecalciferol (VITAMIN D) 1000 units tablet Take 1,000 Units by mouth daily.   Yes [provider]  famotidine (PEPCID) 20 MG tablet Take 20 mg by mouth daily.    Yes [provider]  ferrous sulfate 325 (65 FE) MG tablet Take 325 mg by mouth 2 (two) times a week. Mondays  and Thursdays.   Yes [provider]  hydrOXYzine (ATARAX/VISTARIL) 10 MG tablet Take 1 tablet (10 mg total) by mouth at bedtime. Patient taking differently: Take 10 mg by mouth at bedtime as needed for anxiety (sleep).  07/01/19  Yes [name], [name], DO  levocetirizine (XYZAL) 5 MG tablet Take 1 tablet (5 mg total) by mouth every evening. 09/17/19  Yes Bobbitt, Sedalia Muta, MD  mupirocin cream (BACTROBAN) 2 % Apply 1 application topically 2 (two) times daily. Apply to area on buttocks. Patient taking differently: Apply 1 application topically 2 (two) times daily as needed (rash buttocks.).  10/21/19  Yes [name], [name], DO  ondansetron (ZOFRAN ODT) 4 MG disintegrating tablet Take 1 tablet (4 mg total) by mouth every 8 (eight) hours as needed for nausea or vomiting. 11/09/19  Yes Rai, Ripudeep K, MD  pravastatin (PRAVACHOL) 20 MG tablet Take 1 tablet (20 mg total) by mouth daily. Patient taking differently: Take 20 mg by mouth every evening.  10/17/19  Yes Strader, [place] M, PA-C  rivaroxaban (XARELTO) 20 MG TABS tablet Take 1 tablet daily with evening meal 09/25/19  Yes Satira Sark, MD  traMADol (ULTRAM) 50 MG tablet Take 2 tablets (100 mg total) by mouth every 6 (six) hours as needed for moderate pain. Patient taking differently: Take 50 mg by mouth at  bedtime.  11/09/19  Yes Rai, Ripudeep K, MD  triamcinolone cream (KENALOG) 0.1 % Apply 1 application topically 2 (two) times daily. Patient taking differently: Apply 1 application topically 2 (two) times daily as needed (ears/rash).  10/21/19  Yes [name], [name], DO  vitamin B-12 (CYANOCOBALAMIN) 100 MCG tablet Take 100 mcg by mouth daily.   Yes [provider]  azelastine (ASTELIN) 0.1 % nasal spray Place 1-2 sprays into both nostrils 2 (two) times daily as needed for rhinitis. Use in each nostril as directed Patient not taking: Reported on 11/29/2019 09/17/19   Bobbitt, Sedalia Muta, MD     Allergies:     Allergies  Allergen Reactions  . Lopressor [Metoprolol Tartrate] Swelling    Angioedema  . Allegra Allergy [Fexofenadine Hcl] Hives  . Dilaudid [Hydromorphone]     Hallucinations  . Nsaids     On Xarelto  . Gabapentin (Once-Daily) Rash  . Lipitor [Atorvastatin] Diarrhea     Physical Exam:   Vitals  Blood pressure 113/68, pulse 97, temperature 98.2 F (36.8 C), temperature source Oral, resp. rate 12, height 5' (1.524 m), weight 53.1 kg, SpO2 100 %.   1. General well developed female lying in bed in NAD,    2. Normal affect and insight, Not Suicidal or Homicidal, Awake Alert, Oriented X 3.  3. No F.N deficits, ALL C.Nerves Intact, Strength 4/5 in the right side, with abnormal coordination of right upper extremity, sensation intact all 4 extremities, Plantars down going.  4. Ears and Eyes appear Normal, Conjunctivae clear, PERRLA. Moist Oral Mucosa.  5. Supple Neck, No JVD, No cervical lymphadenopathy appriciated, No Carotid Bruits.  6. Symmetrical Chest wall  movement, Good air movement bilaterally, CTAB.  7. RRR, No Gallops, Rubs or Murmurs, No Parasternal Heave.  8. Positive Bowel Sounds, Abdomen Soft, epigastric/right upper quadrant tenderness, No organomegaly appriciated,No rebound -guarding or rigidity.  9.  No Cyanosis, Normal Skin Turgor, No Skin Rash or  Bruise.  10. Good muscle tone,  joints appear normal , no effusions, Normal ROM.  11. No Palpable Lymph Nodes in Neck or Axillae     Data Review:    CBC Recent Labs  Lab 11/29/19 0909  WBC 10.7*  HGB 15.5*  HCT 47.7*  PLT 354  MCV 99.0  MCH 32.2  MCHC 32.5  RDW 13.9  LYMPHSABS 1.0  MONOABS 1.0  EOSABS 0.3  BASOSABS 0.0   ------------------------------------------------------------------------------------------------------------------  Chemistries  Recent Labs  Lab 11/29/19 0909  NA 135  K 4.0  CL 99  CO2 25  GLUCOSE 91  BUN 12  CREATININE 0.56  CALCIUM 9.1  AST 18  ALT 16  ALKPHOS 58  BILITOT 0.5   ------------------------------------------------------------------------------------------------------------------ estimated creatinine clearance is 54.4 mL/min (by C-G formula based on SCr of 0.56 mg/dL). ------------------------------------------------------------------------------------------------------------------ No results for input(s): TSH, T4TOTAL, T3FREE, THYROIDAB in the last 72 hours.  Invalid input(s): FREET3  Coagulation profile No results for input(s): INR, PROTIME in the last 168 hours. ------------------------------------------------------------------------------------------------------------------- No results for input(s): DDIMER in the last 72 hours. -------------------------------------------------------------------------------------------------------------------  Cardiac Enzymes No results for input(s): CKMB, TROPONINI, MYOGLOBIN in the last 168 hours.  Invalid input(s): CK ------------------------------------------------------------------------------------------------------------------    Component Value Date/Time   BNP 1,276.7 (H) 11/18/2017 0350     ---------------------------------------------------------------------------------------------------------------  Urinalysis    Component Value Date/Time   COLORURINE YELLOW  11/06/2019 2151   APPEARANCEUR HAZY (A) 11/06/2019 2151   LABSPEC 1.019 11/06/2019 2151   PHURINE 6.0 11/06/2019 2151   GLUCOSEU NEGATIVE 11/06/2019 2151   HGBUR SMALL (A) 11/06/2019 2151   BILIRUBINUR NEGATIVE 11/06/2019 2151   BILIRUBINUR Negative 09/18/2019 1137   KETONESUR 20 (A) 11/06/2019 2151   PROTEINUR 100 (A) 11/06/2019 2151   UROBILINOGEN negative (A) 09/18/2019 1137   NITRITE NEGATIVE 11/06/2019 2151   LEUKOCYTESUR SMALL (A) 11/06/2019 2151    ----------------------------------------------------------------------------------------------------------------   Imaging Results:    CT Head Wo Contrast  Result Date: 11/29/2019 CLINICAL DATA:  Right-sided weakness.  Fall. EXAM: CT HEAD WITHOUT CONTRAST TECHNIQUE: Contiguous axial images were obtained from the base of the skull through the vertex without intravenous contrast. COMPARISON:  09/13/2019 FINDINGS: Brain: Evidence of old infarct in the left insular region, left parietal lobe and left temporal lobe. This is stable. No acute infarct or hemorrhage. No hydrocephalus. Vascular: No hyperdense vessel or unexpected calcification. Skull: No acute calvarial abnormality. Sinuses/Orbits: Visualized paranasal sinuses and mastoids clear. Orbital soft tissues unremarkable. Other: None IMPRESSION: Evidence of old left MCA infarct, stable. No acute intracranial abnormality. Electronically Signed   By: Rolm Baptise M.D.   On: 11/29/2019 10:24   CT Abdomen Pelvis W Contrast  Result Date: 11/29/2019 CLINICAL DATA:  Abdominal pain, nausea. EXAM: CT ABDOMEN AND PELVIS WITH CONTRAST TECHNIQUE: Multidetector CT imaging of the abdomen and pelvis was performed using the standard protocol following bolus administration of intravenous contrast. CONTRAST:  156mL OMNIPAQUE IOHEXOL 300 MG/ML  SOLN COMPARISON:  11/06/2019 FINDINGS: Lower chest: Linear bibasilar scarring. Heart is mildly enlarged. No effusions. Hepatobiliary: No focal hepatic abnormality.  Gallbladder unremarkable. Pancreas: Previously seen stranding around the pancreatic head and uncinate process have improved. Cystic mass within the pancreatic head measures 2.1 cm, slightly enlarged since prior  study when this measured 1.8 cm. No ductal dilatation. Spleen: No focal abnormality.  Normal size. Adrenals/Urinary Tract: Bilateral nephrolithiasis. No hydronephrosis. Small nodule in the left adrenal gland is stable. 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 Small bilateral hypodensities in the kidneys, likely cysts, stable, too small to characterize. Proximal right ureteral stone noted, 6 mm. This is stable when compared to prior study. No associated hydronephrosis. Urinary bladder is unremarkable. Stomach/Bowel: Scattered left colonic diverticulosis. No active diverticulitis. Normal appendix. Stomach is decompressed. There is wall thickening involving the 1st and 2nd portions of the duodenum with surrounding inflammation/stranding. This is concerning for duodenitis. Remainder of small bowel is decompressed and unremarkable. Vascular/Lymphatic: Heavily calcified aorta. 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 Ulcerative plaque seen in several areas, stable. Reproductive: Uterus and adnexa unremarkable.  No mass. Other: No free fluid or free air. Musculoskeletal: No acute bony abnormality. IMPRESSION: The previously seen inflammation/stranding around the pancreatic head and uncinate process has decreased. Inflammation in the area now appears to be centered around the 1st and 2nd portions of the duodenum with associated duodenal wall thickening. Findings concerning for duodenitis. Essentially stable pancreatic head pseudocyst. Left colonic diverticulosis. No active diverticulitis. Irregular aortic atherosclerosis with altered plaque and aneurysmal dilatation, 3.1 cm maximally. Electronically Signed   By:  Rolm Baptise M.D.   On: 11/29/2019 10:30    My personal review of EKG: Rhythm NSR, Rate  92 /min, QTc 422 .   Assessment & Plan:    Active Problems:   Cerebrovascular accident (CVA) due to occlusion of left middle cerebral artery (HCC)   Coronary artery disease   Benign essential HTN   Chronic diastolic CHF (congestive heart failure) (HCC)   Paroxysmal atrial fibrillation (HCC)   Abnormality of gait   Acute recurrent pancreatitis   Acute pancreatitis  Fall/dizziness -This is most likely due dehydration and volume depletion from recurrent pancreatitis, but as well she reports worsening right-sided weakness . -Check orthostatics -We will consult PT. -Continue with gentle hydration. -We will obtain MRI to rule out any new ischemic event.  Recurrent pancreatitis -Management per GI, GI consulted in ED, for now we will keep on gentle hydration(known CHF), as needed pain and nausea medications, giving no vomiting, minimal abdominal pain will start on clear liquid diet. -We will await further recommendation from GI.  History of CVA -Continue with statin.  History of A. Fib -Patient on Coreg, will continue -On Xarelto for anticoagulation, will transition to Lovenox during hospital stay if procedure is planned by GI.  Chronic systolic/diastolic CHF -No evidence of volume overload, appears to be slightly on the dry side, continue with gentle hydration.  Hypertension -Continue with low-dose Coreg, check orthostasis  Hyperlipidemia -Continue with statin    DVT Prophylaxis Xaelto>> Eliquis  AM Labs Ordered, also please review Full Orders  Family Communication: Admission, patients condition and plan of care including tests being ordered have been discussed with the patient  who indicate understanding and agree with the plan and Code Status.  Code Status Full  Likely DC to  Home  Condition GUARDED    Consults called: GI by ED  Admission status: Observation  Time spent in  minutes : 60 minutes   Phillips Climes M.D on 11/29/2019 at 11:57 AM  Between 7am to 7pm - Pager - [phone]. After 7pm go to [email or web address] - password [email or web address]  [facility] - Office  [phone]

## 2019-11-29 NOTE — ED Notes (Signed)
Patient transported to MRI 

## 2019-11-29 NOTE — Progress Notes (Signed)
Anesthesia Chart Review:  Follows with cardiology for hx of multivessel CAD status post CABG 2/19, Paroxysmal afib on Xarelto (has Linq device), ischemic cardiomyopathy (EF improved to 50-55% by echo 11/19). Last seen by Dr. Curt Bears 10/22/19. Per note, no angina, no evidence of volume overload. Recommended to continue current medical management.  History of stroke with residual right-sided weakness.  Multiple recent admissions for acute pancreatitis of unclear etiology. She was originally scheduled for procedure on 3/8, however pt admitted 3/5 from ED for observation. Awaiting GI input regarding workup inpatient vs outpatient.  EKG 11/29/19: Sinus rhythm. PVC. Consider left atrial enlargement. Borderline T wave abnormalities. No significant change since 12/20  TTE 08/01/19: 1. Left ventricular ejection fraction, by visual estimation, is 50 to  55%. The left ventricle has low normal function. There is no left  ventricular hypertrophy.  2. Left ventricular diastolic parameters are consistent with Grade I  diastolic dysfunction (impaired relaxation).  3. Hypokinesis/ akinesis of the basal inferior/inferior walls.  4. Global right ventricle has normal systolic function.The right  ventricular size is normal. No increase in right ventricular wall  thickness.  5. Left atrial size was normal.  6. Right atrial size was normal.  7. The mitral valve is normal in structure. No evidence of mitral valve  regurgitation.  8. The tricuspid valve is normal in structure. Tricuspid valve  regurgitation is not demonstrated.  9. The aortic valve is tricuspid. Aortic valve regurgitation is not  visualized. Mild to moderate aortic valve sclerosis/calcification without  any evidence of aortic stenosis.  10. The pulmonic valve was not well visualized. Pulmonic valve  regurgitation is not visualized.   Wynonia Musty [facility] Short Stay Center/Anesthesiology Phone [phone] 11/29/2019 1:34 PM

## 2019-11-29 NOTE — Consult Note (Addendum)
Referring Provider:  EDP Primary Care Physician:  [name], DO Primary Gastroenterologist:  Dr. Bryan Lemma  Reason for Consultation:  Recurrent pancreatitis  HPI: [name] is a 59 y.o. female with a history of CVA, CAD/two-vessel CABG, CHF, atrial fibrillation (on Xarelto), now with acute recurrent pancreatitis of undiagnosed etiology.  Per Dr. Vivia Ewing last office note on 2/15 at which time she was feeling better:  "-Admitted 12/15-18,2020 with acute pancreatitis with unclear etiology. -CT notable for 2.2 cm cyst versus pseudocyst in the head of pancreas.   -Readmitted 1/17-18, 2021 with acute recurrent pancreatitis. -CT acute pancreatitis, slight improvement in size of pseudocyst, wall thickening of prepyloric stomach and proximal duodenum (possibly reactive) -IgG4 negative -Lasix stopped  -Readmitted 2/10-13, 2021 with acute recurrent pancreatitis -Lipase >10k, improved to 837 then 74 by d/c -WBC 17.5, hemoconcentration, normal BUN/creatinine -Blood cultures negative -Admission CT with possible cystic duct stones peripancreatic phlegmon and edema c/w acute pancreatitis.  Stable cystic lesion in HOP -RUQ [place]: No gallstones, CBD 5 mm, hypoechoic HOP lesion (cystic) -MRI/MRCP 09/05/2020: Hepatic steatosis, mild GB distention, focally ectatic CBD measuring 8 mm without CDL.  Pancreatic edema c/w AP, 1.8 x 1.3 cm heterogenous structure (favoring pseudocyst), previously 1.8 x 1.9 cm.  Cystic duct stones not appreciated. -Liver enzymes remained normal, but consideration for microlithiasis as etiology -Stopped Entresto"  Plan for for EUS, which is actually scheduled for this coming Monday, March 8.  Unfortunately, over the past 2 to 3 days she has developed recurrent pain, poor p.o. intake, nausea.  Really related to eat or drink much at all.  Complaining of weakness, particularly right-sided.  She presented to the emergency department this morning for CT scan of the  abdomen pelvis with contrast showed the following:  IMPRESSION: The previously seen inflammation/stranding around the pancreatic head and uncinate process has decreased. Inflammation in the area now appears to be centered around the 1st and 2nd portions of the duodenum with associated duodenal wall thickening. Findings concerning for duodenitis.  Essentially stable pancreatic head pseudocyst.  Left colonic diverticulosis. No active diverticulitis.  Irregular aortic atherosclerosis with altered plaque and aneurysmal dilatation, 3.1 cm maximally.  Lipase 329.  Fluids have only been started at 75 cc/h.  The patient and her husband are extremely frustrated with the fact that her pancreatitis has been recurrent and there has been no explanation.   Past Medical History:  Diagnosis Date  . Angio-edema   . Arthritis   . CAD (coronary artery disease)    a. s/p NSTEMI in 10/2017 with cath showing LM disease --> s/p CABG on 11/23/2017 with LIMA-LAD and SVG-OM.   [name] Epigastric pain    mid  . Hay fever   . Ischemic cardiomyopathy    a. EF 35-40% by echo in 10/2017  . NSTEMI (non-ST elevated myocardial infarction) ([place])   . PAF (paroxysmal atrial fibrillation) ([place])    Diagnosed by ILR  . Pancreatitis   . Pneumonia 11/16/2017  . Stroke [facility]) 08/23/2017   Left MCA infarct status post TPA and thrombectomy  . Urticaria     Past Surgical History:  Procedure Laterality Date  . CORONARY ARTERY BYPASS GRAFT N/A 11/23/2017   Procedure: CORONARY ARTERY BYPASS GRAFTING (CABG) x 2, ON PUMP, USING LEFT INTERNAL MAMMARY ARTERY AND RIGHT GREATER SAPHENOUS VEIN HARVESTED ENDOSCOPICALLY;  Surgeon: Gaye Pollack, MD;  Location: [place];  Service: Open Heart Surgery;  Laterality: N/A;  . CRYOABLATION     cervical  . IR ANGIO  INTRA EXTRACRAN SEL COM CAROTID INNOMINATE UNI R MOD SED  08/21/2017  . IR ANGIO VERTEBRAL SEL SUBCLAVIAN INNOMINATE UNI R MOD SED  08/21/2017  . IR PERCUTANEOUS ART  THROMBECTOMY/INFUSION INTRACRANIAL INC DIAG ANGIO  08/21/2017  . IR RADIOLOGIST EVAL & MGMT  10/30/2017  . LOOP RECORDER INSERTION N/A 08/28/2017   Procedure: LOOP RECORDER INSERTION;  Surgeon: Constance Haw, MD;  Location: [place] CV LAB;  Service: Cardiovascular;  Laterality: N/A;  . RADIOLOGY WITH ANESTHESIA N/A 08/21/2017   Procedure: RADIOLOGY WITH ANESTHESIA;  Surgeon: Luanne Bras, MD;  Location: [place];  Service: Radiology;  Laterality: N/A;  . RIGHT/LEFT HEART CATH AND CORONARY ANGIOGRAPHY N/A 11/20/2017   Procedure: RIGHT/LEFT HEART CATH AND CORONARY ANGIOGRAPHY;  Surgeon: Jettie Booze, MD;  Location: [place] CV LAB;  Service: Cardiovascular;  Laterality: N/A;  . TEE WITHOUT CARDIOVERSION N/A 08/28/2017   Procedure: TRANSESOPHAGEAL ECHOCARDIOGRAM (TEE);  Surgeon: Fay Records, MD;  Location: [place];  Service: Cardiovascular;  Laterality: N/A;  . TEE WITHOUT CARDIOVERSION N/A 11/23/2017   Procedure: TRANSESOPHAGEAL ECHOCARDIOGRAM (TEE);  Surgeon: Gaye Pollack, MD;  Location: [place];  Service: Open Heart Surgery;  Laterality: N/A;  . TUBAL LIGATION      Prior to Admission medications   Medication Sig Start Date End Date Taking? Authorizing Provider  acetaminophen (TYLENOL) 325 MG tablet Take 2 tablets (650 mg total) by mouth every 6 (six) hours as needed for mild pain or moderate pain (or Fever >/= 101). 09/13/19  Yes Rai, Ripudeep K, MD  baclofen (LIORESAL) 10 MG tablet Take 10 mg by mouth in the morning and at bedtime.   Yes [provider]  calcium carbonate (TUMS - DOSED IN MG ELEMENTAL CALCIUM) 500 MG chewable tablet Chew 1 tablet by mouth 2 (two) times daily.    Yes [provider]  carvedilol (COREG) 3.125 MG tablet Take 1 tablet by mouth twice daily Patient taking differently: Take 3.125 mg by mouth in the morning and at bedtime.  09/02/19  Yes Satira Sark, MD  cholecalciferol (VITAMIN D) 1000 units tablet Take 1,000 Units  by mouth daily.   Yes [provider]  famotidine (PEPCID) 20 MG tablet Take 20 mg by mouth daily.    Yes [provider]  ferrous sulfate 325 (65 FE) MG tablet Take 325 mg by mouth 2 (two) times a week. Mondays  and Thursdays.   Yes [provider]  hydrOXYzine (ATARAX/VISTARIL) 10 MG tablet Take 1 tablet (10 mg total) by mouth at bedtime. Patient taking differently: Take 10 mg by mouth at bedtime as needed for anxiety (sleep).  07/01/19  Yes [name], [name], DO  levocetirizine (XYZAL) 5 MG tablet Take 1 tablet (5 mg total) by mouth every evening. 09/17/19  Yes Bobbitt, Sed[place] Muta, MD  mupirocin cream (BACTROBAN) 2 % Apply 1 application topically 2 (two) times daily. Apply to area on buttocks. Patient taking differently: Apply 1 application topically 2 (two) times daily as needed (rash buttocks.).  10/21/19  Yes [name], [name], DO  ondansetron (ZOFRAN ODT) 4 MG disintegrating tablet Take 1 tablet (4 mg total) by mouth every 8 (eight) hours as needed for nausea or vomiting. 11/09/19  Yes Rai, Ripudeep K, MD  pravastatin (PRAVACHOL) 20 MG tablet Take 1 tablet (20 mg total) by mouth daily. Patient taking differently: Take 20 mg by mouth every evening.  10/17/19  Yes Strader, [place] M, PA-C  rivaroxaban (XARELTO) 20 MG TABS tablet Take 1 tablet  daily with evening meal 09/25/19  Yes Satira Sark, MD  traMADol (ULTRAM) 50 MG tablet Take 2 tablets (100 mg total) by mouth every 6 (six) hours as needed for moderate pain. Patient taking differently: Take 50 mg by mouth at bedtime.  11/09/19  Yes Rai, Ripudeep K, MD  triamcinolone cream (KENALOG) 0.1 % Apply 1 application topically 2 (two) times daily. Patient taking differently: Apply 1 application topically 2 (two) times daily as needed (ears/rash).  10/21/19  Yes [name], [name], DO  vitamin B-12 (CYANOCOBALAMIN) 100 MCG tablet Take 100 mcg by mouth daily.   Yes [provider]  azelastine (ASTELIN) 0.1 % nasal  spray Place 1-2 sprays into both nostrils 2 (two) times daily as needed for rhinitis. Use in each nostril as directed Patient not taking: Reported on 11/29/2019 09/17/19   Bobbitt, Sedalia Muta, MD    Current Facility-Administered Medications  Medication Dose Route Frequency Provider Last Rate Last Admin  . sodium chloride (PF) 0.9 % injection            Current Outpatient Medications  Medication Sig Dispense Refill  . acetaminophen (TYLENOL) 325 MG tablet Take 2 tablets (650 mg total) by mouth every 6 (six) hours as needed for mild pain or moderate pain (or Fever >/= 101).    . baclofen (LIORESAL) 10 MG tablet Take 10 mg by mouth in the morning and at bedtime.    . calcium carbonate (TUMS - DOSED IN MG ELEMENTAL CALCIUM) 500 MG chewable tablet Chew 1 tablet by mouth 2 (two) times daily.     . carvedilol (COREG) 3.125 MG tablet Take 1 tablet by mouth twice daily (Patient taking differently: Take 3.125 mg by mouth in the morning and at bedtime. ) 180 tablet 1  . cholecalciferol (VITAMIN D) 1000 units tablet Take 1,000 Units by mouth daily.    . famotidine (PEPCID) 20 MG tablet Take 20 mg by mouth daily.     . ferrous sulfate 325 (65 FE) MG tablet Take 325 mg by mouth 2 (two) times a week. Mondays  and Thursdays.    . hydrOXYzine (ATARAX/VISTARIL) 10 MG tablet Take 1 tablet (10 mg total) by mouth at bedtime. (Patient taking differently: Take 10 mg by mouth at bedtime as needed for anxiety (sleep). ) 90 tablet 1  . levocetirizine (XYZAL) 5 MG tablet Take 1 tablet (5 mg total) by mouth every evening. 60 tablet 5  . mupirocin cream (BACTROBAN) 2 % Apply 1 application topically 2 (two) times daily. Apply to area on buttocks. (Patient taking differently: Apply 1 application topically 2 (two) times daily as needed (rash buttocks.). ) 15 g 0  . ondansetron (ZOFRAN ODT) 4 MG disintegrating tablet Take 1 tablet (4 mg total) by mouth every 8 (eight) hours as needed for nausea or vomiting. 30 tablet 0  .  pravastatin (PRAVACHOL) 20 MG tablet Take 1 tablet (20 mg total) by mouth daily. (Patient taking differently: Take 20 mg by mouth every evening. ) 30 tablet 3  . rivaroxaban (XARELTO) 20 MG TABS tablet Take 1 tablet daily with evening meal 90 tablet 3  . traMADol (ULTRAM) 50 MG tablet Take 2 tablets (100 mg total) by mouth every 6 (six) hours as needed for moderate pain. (Patient taking differently: Take 50 mg by mouth at bedtime. ) 30 tablet 0  . triamcinolone cream (KENALOG) 0.1 % Apply 1 application topically 2 (two) times daily. (Patient taking differently: Apply 1 application topically 2 (two) times daily as needed (  ears/rash). ) 45 g 3  . vitamin B-12 (CYANOCOBALAMIN) 100 MCG tablet Take 100 mcg by mouth daily.    [name] azelastine (ASTELIN) 0.1 % nasal spray Place 1-2 sprays into both nostrils 2 (two) times daily as needed for rhinitis. Use in each nostril as directed (Patient not taking: Reported on 11/29/2019) 30 mL 5    Allergies as of 11/29/2019 - Review Complete 11/29/2019  Allergen Reaction Noted  . Lopressor [metoprolol tartrate] Swelling 09/05/2017  . Allegra allergy [fexofenadine hcl] Hives 07/16/2019  . Dilaudid [hydromorphone]  09/24/2019  . Nsaids  10/24/2018  . Gabapentin (once-daily) Rash 07/01/2019  . Lipitor [atorvastatin] Diarrhea 10/13/2017    Family History  Problem Relation Age of Onset  . Heart attack Father   . Hypertension Mother   . Hyperlipidemia Mother   . Diabetes type II Mother   . Urticaria Mother   . Hypertension Brother   . Breast cancer Cousin 94  . Immunodeficiency Neg Hx   . Eczema Neg Hx   . Atopy Neg Hx   . Asthma Neg Hx   . Angioedema Neg Hx   . Allergic rhinitis Neg Hx   . Colon cancer Neg Hx   . Esophageal cancer Neg Hx   . Stomach cancer Neg Hx   . Pancreatic cancer Neg Hx   . Colon polyps Neg Hx   . Esophageal varices Neg Hx     Social History   Socioeconomic History  . Marital status: Married    Spouse name: Not on file  . Number  of children: 2  . Years of education: Not on file  . Highest education level: Not on file  Occupational History  . Not on file  Tobacco Use  . Smoking status: Current Some Day Smoker    Packs/day: 0.10    Years: 37.00    Pack years: 3.70    Types: Cigarettes  . Smokeless tobacco: Never Used  Substance and Sexual Activity  . Alcohol use: No  . Drug use: No  . Sexual activity: Yes    Partners: Male  Other Topics Concern  . Not on file  Social History Narrative   Married. 2 children.    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    Walks with cane or wheelchair.    Former smoker.    Drinks caffeine.    Smoke alarm in the home.   Firearms in the home.    Wears her seatbelt.    Feels safe In her relationships.    Social Determinants of Health   Financial Resource Strain:   . Difficulty of Paying Living Expenses: Not on file  Food Insecurity:   . Worried About Charity fundraiser in the Last Year: Not on file  . Ran Out of Food in the Last Year: Not on file  Transportation Needs:   . Lack of Transportation (Medical): Not on file  . Lack of Transportation (Non-Medical): Not on file  Physical Activity:   . Days of Exercise per Week: Not on file  . Minutes of Exercise per Session: Not on file  Stress:   . Feeling of Stress : Not on file  Social Connections:   . Frequency of Communication with Friends and Family: Not on file  . Frequency of Social Gatherings with Friends and Family: Not on file  . Attends Religious Services: Not on file  . Active Member of Clubs or Organizations: Not on file  . Attends Archivist Meetings: Not on file  .  Marital Status: Not on file  Intimate Partner Violence:   . Fear of Current or Ex-Partner: Not on file  . Emotionally Abused: Not on file  . Physically Abused: Not on file  . Sexually Abused: Not on file    Review of Systems: ROS is O/W negative except as mentioned in HPI.  Physical Exam: Vital signs in last 24 hours: Temp:   [98.2 F (36.8 C)] 98.2 F (36.8 C) (03/05 0838) Pulse Rate:  [77-112] 97 (03/05 1100) Resp:  [12-20] 12 (03/05 1100) BP: (113-148)/(68-96) 113/68 (03/05 1100) SpO2:  [80 %-100 %] 100 % (03/05 1100) Weight:  [53.1 kg] 53.1 kg (03/05 0839)   General:  Alert, Well-developed, well-nourished, pleasant and cooperative in NAD Head:  Normocephalic and atraumatic. Eyes:  Sclera clear, no icterus.  Conjunctiva pink. Ears:  Normal auditory acuity. Mouth:  No deformity or lesions.   Lungs:  Clear throughout to auscultation.  No wheezes, crackles, or rhonchi.  Heart:  Regular rate and rhythm; no murmurs, clicks, rubs, or gallops. Abdomen:  Soft, non-distended.  BS present.  Epigastric TTP.  Msk:  Symmetrical without gross deformities.  Pulses:  Normal pulses noted. Extremities:  Without clubbing or edema. Neurologic:  Alert and oriented x 4; some right sided weakness noted. Skin:  Intact without significant lesions or rashes. Psych:  Alert and cooperative. Normal mood and affect.  Intake/Output this shift: Total I/O In: 1000 [IV Piggyback:1000] Out: -   Lab Results: Recent Labs    11/29/19 0909  WBC 10.7*  HGB 15.5*  HCT 47.7*  PLT 354   BMET Recent Labs    11/29/19 0909  NA 135  K 4.0  CL 99  CO2 25  GLUCOSE 91  BUN 12  CREATININE 0.56  CALCIUM 9.1   LFT Recent Labs    11/29/19 0909  PROT 7.2  ALBUMIN 4.1  AST 18  ALT 16  ALKPHOS 58  BILITOT 0.5   Studies/Results: CT Head Wo Contrast  Result Date: 11/29/2019 CLINICAL DATA:  Right-sided weakness.  Fall. EXAM: CT HEAD WITHOUT CONTRAST TECHNIQUE: Contiguous axial images were obtained from the base of the skull through the vertex without intravenous contrast. COMPARISON:  09/13/2019 FINDINGS: Brain: Evidence of old infarct in the left insular region, left parietal lobe and left temporal lobe. This is stable. No acute infarct or hemorrhage. No hydrocephalus. Vascular: No hyperdense vessel or unexpected calcification.  Skull: No acute calvarial abnormality. Sinuses/Orbits: Visualized paranasal sinuses and mastoids clear. Orbital soft tissues unremarkable. Other: None IMPRESSION: Evidence of old left MCA infarct, stable. No acute intracranial abnormality. Electronically Signed   By: Rolm Baptise M.D.   On: 11/29/2019 10:24   CT Abdomen Pelvis W Contrast  Result Date: 11/29/2019 CLINICAL DATA:  Abdominal pain, nausea. EXAM: CT ABDOMEN AND PELVIS WITH CONTRAST TECHNIQUE: Multidetector CT imaging of the abdomen and pelvis was performed using the standard protocol following bolus administration of intravenous contrast. CONTRAST:  14mL OMNIPAQUE IOHEXOL 300 MG/ML  SOLN COMPARISON:  11/06/2019 FINDINGS: Lower chest: Linear bibasilar scarring. Heart is mildly enlarged. No effusions. Hepatobiliary: No focal hepatic abnormality. Gallbladder unremarkable. Pancreas: Previously seen stranding around the pancreatic head and uncinate process have improved. Cystic mass within the pancreatic head measures 2.1 cm, slightly enlarged since prior study when this measured 1.8 cm. No ductal dilatation. Spleen: No focal abnormality.  Normal size. Adrenals/Urinary Tract: Bilateral nephrolithiasis. No hydronephrosis. Small nodule in the left adrenal gland is stable. Fat containing nodule in the lower pole of the left kidney  is stable compatible with small angiomyolipoma. Small bilateral hypodensities in the kidneys, likely cysts, stable, too small to characterize. Proximal right ureteral stone noted, 6 mm. This is stable when compared to prior study. No associated hydronephrosis. Urinary bladder is unremarkable. Stomach/Bowel: Scattered left colonic diverticulosis. No active diverticulitis. Normal appendix. Stomach is decompressed. There is wall thickening involving the 1st and 2nd portions of the duodenum with surrounding inflammation/stranding. This is concerning for duodenitis. Remainder of small bowel is decompressed and unremarkable.  Vascular/Lymphatic: Heavily calcified aorta. 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 Ulcerative plaque seen in several areas, stable. Reproductive: Uterus and adnexa unremarkable.  No mass. Other: No free fluid or free air. Musculoskeletal: No acute bony abnormality. IMPRESSION: The previously seen inflammation/stranding around the pancreatic head and uncinate process has decreased. Inflammation in the area now appears to be centered around the 1st and 2nd portions of the duodenum with associated duodenal wall thickening. Findings concerning for duodenitis. Essentially stable pancreatic head pseudocyst. Left colonic diverticulosis. No active diverticulitis. Irregular aortic atherosclerosis with altered plaque and aneurysmal dilatation, 3.1 cm maximally. Electronically Signed   By: Rolm Baptise M.D.   On: 11/29/2019 10:30    IMPRESSION:  1) Acute recurrent pancreatitis of unknown etiology 2) Upper abdominal pain, poor appetite and PO intake with weakness 3) Pancreatic cyst 4) ? Duodenitis on CT scan  Acute recurrent pancreatitis of unclear etiology.  Previous imaging without cholelithiasis, and otherwise normal TG, does not drink ETOH.  No preceding medication changes, but possible that her Entresto (sacubitril/valsartan; not commonly implicated however) or furosemide could have been the culprit agents.  Both are now stopped.  PLAN: -She is currently scheduled for EUS with [name] Landmark on Monday, March 8.  Per his request we will leave that scheduled tentatively for now and see how she does/how things go over the next couple of days.  Typically this is usually delayed until acute pancreatic inflammation subsides.  She is on Xarelto as an outpatient and they are holding that, but going to place her on Lovenox so that will need to be held prior to her endoscopic ultrasound if that does still occur on Monday, March 8. -Otherwise standard pancreatitis supportive  care with IV fluids.  Currently only on 75 cc/h.  She does have a history of heart failure, but would increase this if possible to 150 cc/h (although any increase would be helpful).  Antiemetics and pain control. -She was started on Pepcid, but I have discontinued that and placed her on pantoprazole 40 mg IV twice daily for now.   Laban Emperor. Zehr  11/29/2019, 11:44 AM     Attending Physician Note   I have taken a history, examined the patient and reviewed the chart. I agree with the Advanced Practitioner's note, impression and recommendations. We discussed the patient, problems and plans in detail prior to her writing the note.  In addition I spoke with the patients husband by phone at the patients beside. Both the patient and her husband are frustrated and upset with her recurrent pancreatitis and the inability to prevent recurrent attacks. In addition to standard management for acute pancreatitis and the planned EUS on Monday I briefly discussed an opinion at an academic medical center. EUS could be delayed depending on her course with decision per [name] Landmark.   Lucio Edward, MD [facility]

## 2019-11-29 NOTE — Telephone Encounter (Signed)
Patient called answering service. Received call at 756AM. Patient stated that she was having another "pancreas attack" and was already on her way to [facility] to the answering service. As patient already on way to hospital I did not call back. Will place message to Dr. Bryan Lemma - her primary GI and Dr. Fuller Plan - Inpatient GI, should they get called about patient.  FYI VC and MS.  Justice Britain, MD [facility] Advanced Endoscopy Office # [phone]

## 2019-11-29 NOTE — ED Notes (Signed)
Patient's oxygen dropped to mid-80s after morphine administration, placed on 2L [place] until she can return to baseline.

## 2019-11-29 NOTE — Anesthesia Preprocedure Evaluation (Addendum)
Anesthesia Evaluation  Patient identified by MRN, date of birth, ID band Patient awake    Reviewed: Allergy & Precautions, NPO status , Patient's Chart, lab work & pertinent test results  Airway Mallampati: III  TM Distance: >3 FB Neck ROM: Full    Dental no notable dental hx. (+) Teeth Intact, Dental Advisory Given   Pulmonary Current Smoker,    Pulmonary exam normal breath sounds clear to auscultation       Cardiovascular hypertension, Pt. on medications and Pt. on home beta blockers + CAD, + Past MI and +CHF  Normal cardiovascular exam+ dysrhythmias Atrial Fibrillation  Rhythm:Regular Rate:Normal  s/p NSTEMI in 10/2017 with cath showing LM disease --> s/p CABG on 11/23/2017 with LIMA-LAD and SVG-OM.    Last echo 07/2019: 1. Left ventricular ejection fraction, by visual estimation, is 50 to 55%. The left ventricle has low normal function. There is no left ventricular hypertrophy.  2. Left ventricular diastolic parameters are consistent with Grade I diastolic dysfunction (impaired relaxation).  3. Hypokinesis/ akinesis of the basal inferior/inferior walls.  4. Global right ventricle has normal systolic function.The right  ventricular size is normal. No increase in right ventricular wall thickness.  5. Left atrial size was normal.  6. Right atrial size was normal.  7. The mitral valve is normal in structure. No evidence of mitral valve regurgitation.  8. The tricuspid valve is normal in structure. Tricuspid valve regurgitation is not demonstrated.  9. The aortic valve is tricuspid. Aortic valve regurgitation is not visualized. Mild to moderate aortic valve sclerosis/calcification without any evidence of aortic stenosis.  10. The pulmonic valve was not well visualized. Pulmonic valve regurgitation is not visualized.    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 Still with residual R sided weakness CVA, Residual  Symptoms negative psych ROS   GI/Hepatic Neg liver ROS, Pancreatitis, mid epigastric pain   Endo/Other  negative endocrine ROS  Renal/GU negative Renal ROS  negative genitourinary   Musculoskeletal  (+) Arthritis , Osteoarthritis,    Abdominal Normal abdominal exam  (+)   Peds negative pediatric ROS (+)  Hematology negative hematology ROS (+)   Anesthesia Other Findings   Reproductive/Obstetrics negative OB ROS                           Anesthesia Physical Anesthesia Plan  ASA: III  Anesthesia Plan: MAC   Post-op Pain Management:    Induction:   PONV Risk Score and Plan: 2 and Propofol infusion and TIVA  Airway Management Planned: Natural Airway and Simple Face Mask  Additional Equipment: None  Intra-op Plan:   Post-operative Plan:   Informed Consent: I have reviewed the patients History and Physical, chart, labs and discussed the procedure including the risks, benefits and alternatives for the proposed anesthesia with the patient or authorized representative who has indicated his/her understanding and acceptance.       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 Last seen by Dr. Curt Bears 10/22/19. Per note, no angina, no evidence of volume overload. Recommended to continue current medical management.  History of stroke with residual right-sided weakness.  Multiple recent admissions for acute pancreatitis of unclear etiology. She was originally scheduled for procedure on 3/8, however pt admitted 3/5 from ED for observation. Awaiting GI input regarding  workup inpatient vs outpatient.  EKG 11/29/19: Sinus rhythm. PVC. Consider left atrial enlargement. Borderline T wave abnormalities. No significant change since 12/20  TTE 08/01/19: 1. Left  ventricular ejection fraction, by visual estimation, is 50 to  55%. The left ventricle has low normal function. There is no left  ventricular hypertrophy.  2. Left ventricular diastolic parameters are consistent with Grade I  diastolic dysfunction (impaired relaxation).  3. Hypokinesis/ akinesis of the basal inferior/inferior walls.  4. Global right ventricle has normal systolic function.The right  ventricular size is normal. No increase in right ventricular wall  thickness.  5. Left atrial size was normal.  6. Right atrial size was normal.  7. The mitral valve is normal in structure. No evidence of mitral valve  regurgitation.  8. The tricuspid valve is normal in structure. Tricuspid valve  regurgitation is not demonstrated.  9. The aortic valve is tricuspid. Aortic valve regurgitation is not  visualized. Mild to moderate aortic valve sclerosis/calcification without  any evidence of aortic stenosis.  10. The pulmonic valve was not well visualized. Pulmonic valve  regurgitation is not visualized. )       Anesthesia Quick Evaluation

## 2019-11-29 NOTE — ED Triage Notes (Signed)
Patient c/o abdominal pain x 3 days. Patient states she is scheduled for an endoscopy in 3 days  Patient also reports loss of balance and dizziness since yesterday. Patient states she fell, but did not hit her head.

## 2019-11-30 DIAGNOSIS — K228 Other specified diseases of esophagus: Secondary | ICD-10-CM | POA: Diagnosis present

## 2019-11-30 DIAGNOSIS — K838 Other specified diseases of biliary tract: Secondary | ICD-10-CM | POA: Diagnosis present

## 2019-11-30 DIAGNOSIS — Z951 Presence of aortocoronary bypass graft: Secondary | ICD-10-CM | POA: Diagnosis not present

## 2019-11-30 DIAGNOSIS — I714 Abdominal aortic aneurysm, without rupture: Secondary | ICD-10-CM | POA: Diagnosis present

## 2019-11-30 DIAGNOSIS — I899 Noninfective disorder of lymphatic vessels and lymph nodes, unspecified: Secondary | ICD-10-CM | POA: Diagnosis present

## 2019-11-30 DIAGNOSIS — R269 Unspecified abnormalities of gait and mobility: Secondary | ICD-10-CM | POA: Diagnosis not present

## 2019-11-30 DIAGNOSIS — L509 Urticaria, unspecified: Secondary | ICD-10-CM | POA: Diagnosis present

## 2019-11-30 DIAGNOSIS — Z888 Allergy status to other drugs, medicaments and biological substances status: Secondary | ICD-10-CM | POA: Diagnosis not present

## 2019-11-30 DIAGNOSIS — I480 Paroxysmal atrial fibrillation: Secondary | ICD-10-CM | POA: Diagnosis present

## 2019-11-30 DIAGNOSIS — K862 Cyst of pancreas: Secondary | ICD-10-CM

## 2019-11-30 DIAGNOSIS — E785 Hyperlipidemia, unspecified: Secondary | ICD-10-CM | POA: Diagnosis present

## 2019-11-30 DIAGNOSIS — W19XXXA Unspecified fall, initial encounter: Secondary | ICD-10-CM | POA: Diagnosis present

## 2019-11-30 DIAGNOSIS — I69351 Hemiplegia and hemiparesis following cerebral infarction affecting right dominant side: Secondary | ICD-10-CM | POA: Diagnosis not present

## 2019-11-30 DIAGNOSIS — I2510 Atherosclerotic heart disease of native coronary artery without angina pectoris: Secondary | ICD-10-CM | POA: Diagnosis present

## 2019-11-30 DIAGNOSIS — Z20822 Contact with and (suspected) exposure to covid-19: Secondary | ICD-10-CM | POA: Diagnosis present

## 2019-11-30 DIAGNOSIS — I110 Hypertensive heart disease with heart failure: Secondary | ICD-10-CM | POA: Diagnosis present

## 2019-11-30 DIAGNOSIS — K2980 Duodenitis without bleeding: Secondary | ICD-10-CM | POA: Diagnosis present

## 2019-11-30 DIAGNOSIS — K8500 Idiopathic acute pancreatitis without necrosis or infection: Secondary | ICD-10-CM | POA: Diagnosis not present

## 2019-11-30 DIAGNOSIS — K859 Acute pancreatitis without necrosis or infection, unspecified: Secondary | ICD-10-CM | POA: Diagnosis present

## 2019-11-30 DIAGNOSIS — I5042 Chronic combined systolic (congestive) and diastolic (congestive) heart failure: Secondary | ICD-10-CM | POA: Diagnosis present

## 2019-11-30 DIAGNOSIS — K863 Pseudocyst of pancreas: Secondary | ICD-10-CM | POA: Diagnosis present

## 2019-11-30 DIAGNOSIS — Z6822 Body mass index (BMI) 22.0-22.9, adult: Secondary | ICD-10-CM | POA: Diagnosis not present

## 2019-11-30 DIAGNOSIS — E43 Unspecified severe protein-calorie malnutrition: Secondary | ICD-10-CM | POA: Diagnosis present

## 2019-11-30 DIAGNOSIS — Y92009 Unspecified place in unspecified non-institutional (private) residence as the place of occurrence of the external cause: Secondary | ICD-10-CM | POA: Diagnosis not present

## 2019-11-30 DIAGNOSIS — K3189 Other diseases of stomach and duodenum: Secondary | ICD-10-CM | POA: Diagnosis present

## 2019-11-30 DIAGNOSIS — I700 Atherosclerosis of aorta: Secondary | ICD-10-CM | POA: Diagnosis present

## 2019-11-30 DIAGNOSIS — K2950 Unspecified chronic gastritis without bleeding: Secondary | ICD-10-CM | POA: Diagnosis present

## 2019-11-30 DIAGNOSIS — K861 Other chronic pancreatitis: Secondary | ICD-10-CM | POA: Diagnosis present

## 2019-11-30 LAB — COMPREHENSIVE METABOLIC PANEL
ALT: 11 U/L (ref 0–44)
AST: 14 U/L — ABNORMAL LOW (ref 15–41)
Albumin: 3 g/dL — ABNORMAL LOW (ref 3.5–5.0)
Alkaline Phosphatase: 42 U/L (ref 38–126)
Anion gap: 7 (ref 5–15)
BUN: 10 mg/dL (ref 6–20)
CO2: 23 mmol/L (ref 22–32)
Calcium: 8.4 mg/dL — ABNORMAL LOW (ref 8.9–10.3)
Chloride: 108 mmol/L (ref 98–111)
Creatinine, Ser: 0.51 mg/dL (ref 0.44–1.00)
GFR calc Af Amer: >60 mL/min (ref >60)
GFR calc non Af Amer: >60 mL/min (ref >60)
Glucose, Bld: 72 mg/dL (ref 70–99)
Potassium: 3.9 mmol/L (ref 3.5–5.1)
Sodium: 138 mmol/L (ref 135–145)
Total Bilirubin: 0.6 mg/dL (ref 0.3–1.2)
Total Protein: 5.6 g/dL — ABNORMAL LOW (ref 6.5–8.1)

## 2019-11-30 LAB — CBC
HCT: 39.9 % (ref 36.0–46.0)
Hemoglobin: 12.6 g/dL (ref 12.0–15.0)
MCH: 32.4 pg (ref 26.0–34.0)
MCHC: 31.6 g/dL (ref 30.0–36.0)
MCV: 102.6 fL — ABNORMAL HIGH (ref 80.0–100.0)
Platelets: 276 10*3/uL (ref 150–400)
RBC: 3.89 MIL/uL (ref 3.87–5.11)
RDW: 14.1 % (ref 11.5–15.5)
WBC: 8.2 10*3/uL (ref 4.0–10.5)
nRBC: 0 % (ref 0.0–0.2)

## 2019-11-30 LAB — LIPASE, BLOOD: Lipase: 556 U/L — ABNORMAL HIGH (ref 11–51)

## 2019-11-30 MED ORDER — DEXTROSE IN LACTATED RINGERS 5 % IV SOLN: INTRAVENOUS | Status: DC

## 2019-11-30 NOTE — Progress Notes (Signed)
Progress Note   Subjective  Persistent upper abdominal pain. Taking very little PO.    Objective  Vital signs in last 24 hours: Temp:  [98 F (36.7 C)-98.1 F (36.7 C)] 98.1 F (36.7 C) (03/06 0425) Pulse Rate:  [77-97] 87 (03/06 0425) Resp:  [12-20] 15 (03/06 0425) BP: (103-148)/(56-83) 109/56 (03/06 0425) SpO2:  [80 %-100 %] 98 % (03/06 0425)    General: Alert, thin, in NAD Heart:  Regular rate and rhythm; no murmurs Chest: Clear to ascultation bilaterally Abdomen:  Soft, epigastric tenderness and nondistended. Normal bowel sounds, without guarding, and without rebound.   Extremities:  Without edema. Neurologic:  Alert and  oriented x4; grossly normal neurologically. Psych:  Alert and cooperative. Normal mood and affect.  Intake/Output from previous day: 03/05 0701 - 03/06 0700 In: 1038.1 [I.V.:38.1; IV Piggyback:1000] Out: -  Intake/Output this shift: No intake/output data recorded.  Lab Results: Recent Labs    11/29/19 0909 11/30/19 0552  WBC 10.7* 8.2  HGB 15.5* 12.6  HCT 47.7* 39.9  PLT 354 276   BMET Recent Labs    11/29/19 0909 11/30/19 0552  NA 135 138  K 4.0 3.9  CL 99 108  CO2 25 23  GLUCOSE 91 72  BUN 12 10  CREATININE 0.56 0.51  CALCIUM 9.1 8.4*   LFT Recent Labs    11/30/19 0552  PROT 5.6*  ALBUMIN 3.0*  AST 14*  ALT 11  ALKPHOS 42  BILITOT 0.6     Studies/Results: CT Head Wo Contrast  Result Date: 11/29/2019 CLINICAL DATA:  Right-sided weakness.  Fall. EXAM: CT HEAD WITHOUT CONTRAST TECHNIQUE: Contiguous axial images were obtained from the base of the skull through the vertex without intravenous contrast. COMPARISON:  09/13/2019 FINDINGS: Brain: Evidence of old infarct in the left insular region, left parietal lobe and left temporal lobe. This is stable. No acute infarct or hemorrhage. No hydrocephalus. Vascular: No hyperdense vessel or unexpected calcification. Skull: No acute calvarial abnormality. Sinuses/Orbits:  Visualized paranasal sinuses and mastoids clear. Orbital soft tissues unremarkable. Other: None IMPRESSION: Evidence of old left MCA infarct, stable. No acute intracranial abnormality. Electronically Signed   By: Rolm Baptise M.D.   On: 11/29/2019 10:24   MR BRAIN WO CONTRAST  Result Date: 11/29/2019 CLINICAL DATA:  Worsening right-sided weakness EXAM: MRI HEAD WITHOUT CONTRAST TECHNIQUE: Multiplanar, multiecho pulse sequences of the brain and surrounding structures were obtained without intravenous contrast. COMPARISON:  2018 FINDINGS: Brain: There is no acute infarction or intracranial hemorrhage. There is no intracranial mass, mass effect, or edema. There is no hydrocephalus or extra-axial fluid collection. There are small chronic cortical/subcortical infarcts involving the left frontal and parietal lobes. There is a chronic infarct involving the left basal ganglia and corona radiata with extension into the temporal lobe. Minor chronic blood products associated with some of these infarcts. Additional patchy foci of T2 hyperintensity in the supratentorial white matter are nonspecific but likely reflect mild chronic microvascular ischemic changes. There is ex vacuo dilatation of the left lateral ventricle. Vascular: Major vessel flow voids at the skull base are preserved. Skull and upper cervical spine: Normal marrow signal is preserved. Sinuses/Orbits: Paranasal sinuses are aerated. Orbits are unremarkable. Other: Sella is unremarkable. Mastoid air cells are clear. IMPRESSION: No acute infarction. Chronic infarcts as described above. Mild chronic microvascular ischemic changes. Electronically Signed   By: Macy Mis M.D.   On: 11/29/2019 13:13   CT Abdomen Pelvis W Contrast  Result Date: 11/29/2019 CLINICAL DATA:  Abdominal pain, nausea. EXAM: CT ABDOMEN AND PELVIS WITH CONTRAST TECHNIQUE: Multidetector CT imaging of the abdomen and pelvis was performed using the standard protocol following bolus  administration of intravenous contrast. CONTRAST:  111mL OMNIPAQUE IOHEXOL 300 MG/ML  SOLN COMPARISON:  11/06/2019 FINDINGS: Lower chest: Linear bibasilar scarring. Heart is mildly enlarged. No effusions. Hepatobiliary: No focal hepatic abnormality. Gallbladder unremarkable. Pancreas: Previously seen stranding around the pancreatic head and uncinate process have improved. Cystic mass within the pancreatic head measures 2.1 cm, slightly enlarged since prior study when this measured 1.8 cm. No ductal dilatation. Spleen: No focal abnormality.  Normal size. Adrenals/Urinary Tract: Bilateral nephrolithiasis. No hydronephrosis. Small nodule in the left adrenal gland is stable. 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 Small bilateral hypodensities in the kidneys, likely cysts, stable, too small to characterize. Proximal right ureteral stone noted, 6 mm. This is stable when compared to prior study. No associated hydronephrosis. Urinary bladder is unremarkable. Stomach/Bowel: Scattered left colonic diverticulosis. No active diverticulitis. Normal appendix. Stomach is decompressed. There is wall thickening involving the 1st and 2nd portions of the duodenum with surrounding inflammation/stranding. This is concerning for duodenitis. Remainder of small bowel is decompressed and unremarkable. Vascular/Lymphatic: Heavily calcified aorta. 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 Ulcerative plaque seen in several areas, stable. Reproductive: Uterus and adnexa unremarkable.  No mass. Other: No free fluid or free air. Musculoskeletal: No acute bony abnormality. IMPRESSION: The previously seen inflammation/stranding around the pancreatic head and uncinate process has decreased. Inflammation in the area now appears to be centered around the 1st and 2nd portions of the duodenum with associated duodenal wall thickening. Findings  concerning for duodenitis. Essentially stable pancreatic head pseudocyst. Left colonic diverticulosis. No active diverticulitis. Irregular aortic atherosclerosis with altered plaque and aneurysmal dilatation, 3.1 cm maximally. Electronically Signed   By: Rolm Baptise M.D.   On: 11/29/2019 10:30   DG Hip Unilat With Pelvis 2-3 Views Right  Result Date: 11/29/2019 CLINICAL DATA:  Right hip pain secondary to a fall yesterday. EXAM: DG HIP (WITH OR WITHOUT PELVIS) 2-3V RIGHT COMPARISON:  CT scan of the abdomen and pelvis dated 11/07/2019 FINDINGS: There is no evidence of hip fracture or dislocation. Minimal osteophyte formation on the superolateral aspect of the right acetabulum. IMPRESSION: No acute abnormality. Electronically Signed   By: Lorriane Shire M.D.   On: 11/29/2019 12:35      Assessment & Recommendations   1. Acute recurrent pancreatitis, unknown etiology. Mgmt plan discussed with Drs. Mansouraty and Cirigliano on Friday afternoon.  2. 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 Duodenitis by CT, ? related to pancreatitis  Clear liquids as tolerated, IVF, pain control, nausea control for now EUS on Monday per [name] Landmark if she is stable.  Continue to hold Xarelto. Hold Lovenox on Sunday for EUS on Monday. Hold FeSO4 for now Pantoprazole 40 mg bid   LOS: 0 days   Kaitland Lewellyn T. Fuller Plan MD  11/30/2019, 9:12 AM

## 2019-11-30 NOTE — Evaluation (Signed)
Physical Therapy Evaluation Patient Details Name: [name] MRN: [id number] DOB: 1961-01-17 Today's Date: 11/30/2019   History of Present Illness  59yo female c/o weakness, light-headedness, multiple falls, abdominal pain. CTH/MRI negative for acute changes, imaging negative for LE fracture; however abdominal CT does show duodenal inflammation and pancreatitis. PMH CAD, cardiomyopathy, NSTEMI, A-fib, CVA with R residual weakness, CABG, cardiac cath  Clinical Impression   Patient received in bed after toileting with RN, pleasant and willing to participate in PT. See below for mobility/assist levels. Mobilizes as expected given chronic hemiparesis from her prior CVA, did require MinA for dynamic balance however usually uses quad cane at baseline and does have 24/7 support from her family. Reports she was Mod(I) at baseline. She was left in the chair with all needs met, chair alarm active. Currently recommend skilled PT services in neuro OP setting when medically ready for DC.     Follow Up Recommendations Outpatient PT;Supervision for mobility/OOB(neuro OP)    Equipment Recommendations  None recommended by PT(well-equipped)    Recommendations for Other Services       Precautions / Restrictions Precautions Precautions: Fall;Other (comment) Precaution Comments: R hemiparesis Restrictions Weight Bearing Restrictions: No      Mobility  Bed Mobility Overal bed mobility: Modified Independent             General bed mobility comments: use of bed rails, increased time  Transfers Overall transfer level: Needs assistance Equipment used: None Transfers: Sit to/from Stand;Stand Pivot Transfers Sit to Stand: Min guard Stand pivot transfers: Min guard       General transfer comment: min guard for safety, no physical assist given  Ambulation/Gait Ambulation/Gait assistance: Min assist Gait Distance (Feet): 80 Feet Assistive device: None Gait Pattern/deviations: Step-through  pattern;Decreased step length - left;Decreased stance time - right;Decreased dorsiflexion - right;Decreased weight shift to right;Trendelenburg;Drifts right/left Gait velocity: decreased   General Gait Details: mild unsteadiness requiring MinA for balance without U UE support; drifts L/R and easily fatigued today. Unable to get good signal on pulse ox but no signs of hypoxia or SOB on RA.  Stairs            Wheelchair Mobility    Modified Rankin (Stroke Patients Only)       Balance Overall balance assessment: Needs assistance Sitting-balance support: Feet supported Sitting balance-Leahy Scale: Good     Standing balance support: During functional activity Standing balance-Leahy Scale: Fair Standing balance comment: MinA for balance without U UE support- usually uses quad cane                             Pertinent Vitals/Pain Pain Assessment: Faces Faces Pain Scale: Hurts little more Pain Location: abdomen Pain Descriptors / Indicators: Aching;Discomfort Pain Intervention(s): Limited activity within patient's tolerance;Monitored during session    Home Living Family/patient expects to be discharged to:: Private residence Living Arrangements: Spouse/significant other Available Help at Discharge: Family;Available 24 hours/day Type of Home: House Home Access: Stairs to enter Entrance Stairs-Rails: None(has a post she can hold on to) Technical brewer of Steps: 2 Home Layout: One level Home Equipment: Cane - quad;Bedside commode;Wheelchair - [employer] - 2 wheels Additional Comments: family available 24/7, not on oxygen at home    Prior Function Level of Independence: Independent               Hand Dominance        Extremity/Trunk Assessment   Upper Extremity Assessment Upper Extremity  Assessment: Defer to OT evaluation(R hemiparesis)    Lower Extremity Assessment Lower Extremity Assessment: RLE deficits/detail RLE Deficits / Details:  chronic R hemiparesis RLE Sensation: decreased proprioception RLE Coordination: decreased gross motor;decreased fine motor    Cervical / Trunk Assessment Cervical / Trunk Assessment: Normal  Communication   Communication: No difficulties  Cognition Arousal/Alertness: Awake/alert Behavior During Therapy: WFL for tasks assessed/performed Overall Cognitive Status: Within Functional Limits for tasks assessed                                        General Comments      Exercises     Assessment/Plan    PT Assessment Patient needs continued PT services  PT Problem List Decreased strength;Decreased balance;Decreased mobility;Decreased coordination       PT Treatment Interventions DME instruction;Balance training;Gait training;Neuromuscular re-education;Stair training;Functional mobility training;Patient/family education;Therapeutic activities;Therapeutic exercise    PT Goals (Current goals can be found in the Care Plan section)  Acute Rehab PT Goals Patient Stated Goal: go home, stop having issues with pancreas PT Goal Formulation: With patient Time For Goal Achievement: 12/14/19 Potential to Achieve Goals: Good    Frequency Min 3X/week   Barriers to discharge        Co-evaluation               AM-PAC PT "6 Clicks" Mobility  Outcome Measure Help needed turning from your back to your side while in a flat bed without using bedrails?: None Help needed moving from lying on your back to sitting on the side of a flat bed without using bedrails?: A Little Help needed moving to and from a bed to a chair (including a wheelchair)?: A Little Help needed standing up from a chair using your arms (e.g., wheelchair or bedside chair)?: A Little Help needed to walk in hospital room?: A Little Help needed climbing 3-5 steps with a railing? : A Little 6 Click Score: 19    End of Session Equipment Utilized During Treatment: Gait belt Activity Tolerance: Patient  tolerated treatment well Patient left: in chair;with call bell/phone within reach;with chair alarm set   PT Visit Diagnosis: Unsteadiness on feet (R26.81);Other abnormalities of gait and mobility (R26.89);Muscle weakness (generalized) (M62.81);Other symptoms and signs involving the nervous system (R29.898);Difficulty in walking, not elsewhere classified (R26.2)    Time: 3536-1443 PT Time Calculation (min) (ACUTE ONLY): 32 min   Charges:   PT Evaluation $PT Eval Moderate Complexity: 1 Mod PT Treatments $Gait Training: 8-22 mins        Windell Norfolk, DPT, PN1   Supplemental Physical Therapist [place]    Pager [phone] Acute Rehab Office [phone]

## 2019-11-30 NOTE — Progress Notes (Addendum)
PROGRESS NOTE  [name] [id number] DOB: [birth year] DOA: 11/29/2019 PCP: [name], DO  HPI/Recap of past 24 hours: [name]  is a 59 year old female with a history of CVA, CAD/two-vessel CABG, CHF, atrial fibrillation (on Xarelto),with recent hospitalization with acute pancreatitis, with no clear etiology, she presents to ED with multiple complaints, including mainly generalized weakness, lightheadedness and fall hitting her R hip no acute fx per xray.   Also reports abdominal pain, poor appetite, nausea with no vomiting.  Her symptoms worsened over last 24 hours.   ED work-up significant for lipase of 329, CT showed improvement in pancreatic head inflammation, but evidence of new inflammation in the first and second portion of the duodenum concerning for duodenitis.  TRH asked to admit.  [facility] [place] consulted and following.  11/30/19:  Seen and examined. Reports abdominal pain worsens when she eats.  Lipase trending up to > 500 this am.  Assessment/Plan: Active Problems:   Cerebrovascular accident (CVA) due to occlusion of left middle cerebral artery (HCC)   Coronary artery disease   Benign essential HTN   Chronic diastolic CHF (congestive heart failure) (HCC)   Paroxysmal atrial fibrillation (HCC)   Abnormality of gait   Acute recurrent pancreatitis   Acute pancreatitis  Fall/dizziness Positive orthostatic vital signs Continue fall precautions Continue gentle IV fluid hydration PT OT to assess. MRI negative for acute intracranial findings  Recurrent pancreatitis with pseudocyst measuring 2.1 cm/possible duodenitis Presented with elevated lipase >300 Lipase trending up >500 Stable pancreatic head pseudocyst Management per GI Planned EUS on Monday at [facility] if stable  History of CVA -MRI negative for any acute intracranial findings -Continue with statin.  Paroxysmal A. Fib Rate controlled on Coreg Xarelto on hold due to planned procedure on  Monday Continue full dose Lovenox twice daily  Chronic diastolic CHF Last 2D echo: Normal LVEF 50 to 55% with grade 1 diastolic dysfunction Continue cardiac medications- Continue strict I's and O's and daily weight  Hypertension Blood pressure is at goal -Continue with low-dose Coreg -Positive orthostatic vital sign  Hyperlipidemia -Continue with statin  Ambulatory dysfunction. PT OT to assess Continue fall precautions  Aortic aneurysm, incidentally found Measures 3.1 cm Follow up outpatient    DVT Prophylaxis: Full dose Lovenox BID    Family Communication:  We will call family if reviewed with the patient.   Code Status Full  Disposition: Patient is from home.  Anticipate dc to home once her acute pancreatitis has resolved, GI signs off, and she is able to tolerate a soft diet.  Condition GUARDED    Consults:  [facility] [place]     Objective: Vitals:   11/29/19 1358 11/29/19 2210 11/30/19 0425 11/30/19 1430  BP: 138/83 118/65 (!) 109/56 130/62  Pulse: 95 79 87 91  Resp: 18 16 15 14   Temp: 98 F (36.7 C) 98 F (36.7 C) 98.1 F (36.7 C) 98.7 F (37.1 C)  TempSrc: Oral Oral Oral Oral  SpO2: 95% 100% 98% 95%  Weight:      Height:        Intake/Output Summary (Last 24 hours) at 11/30/2019 1455 Last data filed at 11/30/2019 1000 Gross per 24 hour  Intake 338.06 ml  Output --  Net 338.06 ml   Filed Weights   11/29/19 0839  Weight: 53.1 kg    Exam:  . General: 59 y.o. year-old female well developed well nourished in no acute distress.  Alert and oriented x3. . Cardiovascular: Regular rate and rhythm  with no rubs or gallops.  No thyromegaly or JVD noted.   [name] Respiratory: Clear to auscultation with no wheezes or rales. Good inspiratory effort. . Abdomen: Soft mild diffused tenderness with palpation.  Bowel sounds present. . Musculoskeletal: No lower extremity edema bilaterally. [name] Psychiatry: Mood is appropriate for condition and setting.   Data  Reviewed: CBC: Recent Labs  Lab 11/29/19 0909 11/30/19 0552  WBC 10.7* 8.2  NEUTROABS 8.3*  --   HGB 15.5* 12.6  HCT 47.7* 39.9  MCV 99.0 102.6*  PLT 354 [id number]   Basic Metabolic Panel: Recent Labs  Lab 11/29/19 0909 11/30/19 0552  NA 135 138  K 4.0 3.9  CL 99 108  CO2 25 23  GLUCOSE 91 72  BUN 12 10  CREATININE 0.56 0.51  CALCIUM 9.1 8.4*   GFR: Estimated Creatinine Clearance: 54.4 mL/min (by C-G formula based on SCr of 0.51 mg/dL). Liver Function Tests: Recent Labs  Lab 11/29/19 0909 11/30/19 0552  AST 18 14*  ALT 16 11  ALKPHOS 58 42  BILITOT 0.5 0.6  PROT 7.2 5.6*  ALBUMIN 4.1 3.0*   Recent Labs  Lab 11/29/19 0909 11/30/19 0552  LIPASE 329* 556*   No results for input(s): AMMONIA in the last 168 hours. Coagulation Profile: No results for input(s): INR, PROTIME in the last 168 hours. Cardiac Enzymes: No results for input(s): CKTOTAL, CKMB, CKMBINDEX, TROPONINI in the last 168 hours. BNP (last 3 results) No results for input(s): PROBNP in the last 8760 hours. HbA1C: No results for input(s): HGBA1C in the last 72 hours. CBG: No results for input(s): GLUCAP in the last 168 hours. Lipid Profile: No results for input(s): CHOL, HDL, LDLCALC, TRIG, CHOLHDL, LDLDIRECT in the last 72 hours. Thyroid Function Tests: No results for input(s): TSH, T4TOTAL, FREET4, T3FREE, THYROIDAB in the last 72 hours. Anemia Panel: No results for input(s): VITAMINB12, FOLATE, FERRITIN, TIBC, IRON, RETICCTPCT in the last 72 hours. Urine analysis:    Component Value Date/Time   COLORURINE YELLOW 11/06/2019 2151   APPEARANCEUR HAZY (A) 11/06/2019 2151   LABSPEC 1.019 11/06/2019 2151   PHURINE 6.0 11/06/2019 2151   GLUCOSEU NEGATIVE 11/06/2019 2151   HGBUR SMALL (A) 11/06/2019 2151   BILIRUBINUR NEGATIVE 11/06/2019 2151   BILIRUBINUR Negative 09/18/2019 1137   KETONESUR 20 (A) 11/06/2019 2151   PROTEINUR 100 (A) 11/06/2019 2151   UROBILINOGEN negative (A) 09/18/2019 1137     NITRITE NEGATIVE 11/06/2019 2151   LEUKOCYTESUR SMALL (A) 11/06/2019 2151   Sepsis Labs: @LABRCNTIP (procalcitonin:4,lacticidven:4)  ) Recent Results (from the past 240 hour(s))  SARS CORONAVIRUS 2 (TAT 6-24 HRS) Nasopharyngeal Nasopharyngeal Swab     Status: None   Collection Time: 11/28/19  9:51 AM   Specimen: Nasopharyngeal Swab  Result Value Ref Range Status   SARS Coronavirus 2 NEGATIVE NEGATIVE Final    Comment: (NOTE) SARS-CoV-2 target nucleic acids are NOT DETECTED. The SARS-CoV-2 RNA is generally detectable in upper and lower respiratory specimens during the acute phase of infection. Negative results do not preclude SARS-CoV-2 infection, do not rule out co-infections with other pathogens, and should not be used as the sole basis for treatment or other patient management decisions. Negative results must be combined with clinical observations, patient history, and epidemiological information. The expected result is Negative. Fact Sheet for Patients: SugarRoll.be Fact Sheet for Healthcare Providers: https://www.woods-mathews.com/ This test is not yet approved or cleared by the [place] FDA and  has been authorized for detection and/or diagnosis of SARS-CoV-2 by FDA under  an Emergency Use Authorization (EUA). This EUA will remain  in effect (meaning this test can be used) for the duration of the COVID-19 declaration under Section 56 4(b)(1) of the Act, 21 U.S.C. section 360bbb-3(b)(1), unless the authorization is terminated or revoked sooner. Performed at [facility], [place] [address] [place], [place] [address]   SARS CORONAVIRUS 2 (TAT 6-24 HRS) Nasopharyngeal Nasopharyngeal Swab     Status: None   Collection Time: 11/29/19 12:20 PM   Specimen: Nasopharyngeal Swab  Result Value Ref Range Status   SARS Coronavirus 2 NEGATIVE NEGATIVE Final    Comment: (NOTE) SARS-CoV-2 target nucleic acids are NOT DETECTED. The  SARS-CoV-2 RNA is generally detectable in upper and lower respiratory specimens during the acute phase of infection. Negative results do not preclude SARS-CoV-2 infection, do not rule out co-infections with other pathogens, and should not be used as the sole basis for treatment or other patient management decisions. Negative results must be combined with clinical observations, patient history, and epidemiological information. The expected result is Negative. Fact Sheet for Patients: SugarRoll.be Fact Sheet for Healthcare Providers: https://www.woods-mathews.com/ This test is not yet approved or cleared by the [place] FDA and  has been authorized for detection and/or diagnosis of SARS-CoV-2 by FDA under an Emergency Use Authorization (EUA). This EUA will remain  in effect (meaning this test can be used) for the duration of the COVID-19 declaration under Section 56 4(b)(1) of the Act, 21 U.S.C. section 360bbb-3(b)(1), unless the authorization is terminated or revoked sooner. Performed at [facility], [place] [address], [place], [place] [address]       Studies: No results found.  Scheduled Meds: . carvedilol  3.125 mg Oral BID  . enoxaparin (LOVENOX) injection  1 mg/kg Subcutaneous Q12H  . pantoprazole (PROTONIX) IV  40 mg Intravenous Q12H  . pravastatin  20 mg Oral QPM    Continuous Infusions: . dextrose 5% lactated ringers 75 mL/hr at 11/30/19 1153     LOS: 0 days     Kayleen Memos, MD [facility] Pager [phone]  If 7PM-7AM, please contact night-coverage [email or web address] Password [facility] 11/30/2019, 2:55 PM

## 2019-12-01 DIAGNOSIS — K8500 Idiopathic acute pancreatitis without necrosis or infection: Secondary | ICD-10-CM

## 2019-12-01 LAB — CBC
HCT: 41.4 % (ref 36.0–46.0)
Hemoglobin: 13.2 g/dL (ref 12.0–15.0)
MCH: 32 pg (ref 26.0–34.0)
MCHC: 31.9 g/dL (ref 30.0–36.0)
MCV: 100.2 fL — ABNORMAL HIGH (ref 80.0–100.0)
Platelets: 313 10*3/uL (ref 150–400)
RBC: 4.13 MIL/uL (ref 3.87–5.11)
RDW: 13.7 % (ref 11.5–15.5)
WBC: 6.6 10*3/uL (ref 4.0–10.5)
nRBC: 0 % (ref 0.0–0.2)

## 2019-12-01 LAB — BASIC METABOLIC PANEL
Anion gap: 7 (ref 5–15)
BUN: <5 mg/dL — ABNORMAL LOW (ref 6–20)
CO2: 27 mmol/L (ref 22–32)
Calcium: 8.7 mg/dL — ABNORMAL LOW (ref 8.9–10.3)
Chloride: 105 mmol/L (ref 98–111)
Creatinine, Ser: 0.44 mg/dL (ref 0.44–1.00)
GFR calc Af Amer: >60 mL/min (ref >60)
GFR calc non Af Amer: >60 mL/min (ref >60)
Glucose, Bld: 116 mg/dL — ABNORMAL HIGH (ref 70–99)
Potassium: 3.4 mmol/L — ABNORMAL LOW (ref 3.5–5.1)
Sodium: 139 mmol/L (ref 135–145)

## 2019-12-01 LAB — LIPASE, BLOOD: Lipase: 336 U/L — ABNORMAL HIGH (ref 11–51)

## 2019-12-01 MED ORDER — PANTOPRAZOLE SODIUM 40 MG PO TBEC
40.0000 mg | DELAYED_RELEASE_TABLET | Freq: Every day | ORAL | Status: DC
  Administered 2019-12-02 – 2019-12-04 (×3): 40 mg via ORAL
  Filled 2019-12-01 (×3): qty 1

## 2019-12-01 MED ORDER — POTASSIUM CHLORIDE CRYS ER 20 MEQ PO TBCR
20.0000 meq | EXTENDED_RELEASE_TABLET | Freq: Once | ORAL | Status: AC
  Administered 2019-12-01: 20 meq via ORAL
  Filled 2019-12-01: qty 1

## 2019-12-01 NOTE — Progress Notes (Signed)
PROGRESS NOTE  [name] [id number] DOB: [birth year] DOA: 11/29/2019 PCP: [name], DO  HPI/Recap of past 24 hours: [name]  is a 59 year old female with a history of CVA, CAD/two-vessel CABG, CHF, atrial fibrillation (on Xarelto),with recent hospitalization with acute pancreatitis, with no clear etiology, she presents to ED with multiple complaints, including mainly generalized weakness, lightheadedness and fall hitting her R hip no acute fx per xray.   Also reports abdominal pain, poor appetite, nausea with no vomiting.  Her symptoms worsened over last 24 hours.   ED work-up significant for lipase of 329, CT showed improvement in pancreatic head inflammation, but evidence of new inflammation in the first and second portion of the duodenum concerning for duodenitis.  TRH asked to admit.  GI [place] consulted and following.  12/01/19: Seen and examined.  No acute events overnight.  She has mostly been drinking water.  No significant abdominal pain or nausea at this time.  Lipase slowly trending down.  Care directed by GI.  Possible endoscopy ultrasound tomorrow at [facility].  N.p.o. after midnight.   Assessment/Plan: Active Problems:   Cerebrovascular accident (CVA) due to occlusion of left middle cerebral artery (HCC)   Coronary artery disease   Benign essential HTN   Chronic diastolic CHF (congestive heart failure) (HCC)   Paroxysmal atrial fibrillation (HCC)   Abnormality of gait   Acute recurrent pancreatitis   Acute pancreatitis  Fall/dizziness Positive orthostatic vital signs Continue fall precautions Continue gentle IV fluid hydration PT OT consulted to assess.  Recommendation for outpatient PT.  Supervision for mobility. MRI negative for acute intracranial findings  Recurrent pancreatitis with pseudocyst measuring 2.1 cm/possible duodenitis Presented with elevated lipase >300 Lipase trending down. Stable pancreatic head pseudocyst Management  per GI Planned EUS on Monday at [facility] if stable N.p.o. after midnight.  History of CVA -MRI negative for any acute intracranial findings -Continue with statin.  Paroxysmal A. Fib Rate controlled on Coreg Full dose subcu Lovenox on hold due to planned procedure tomorrow.  Chronic diastolic CHF Stable. Last 2D echo: Normal LVEF 50 to 55% with grade 1 diastolic dysfunction Continue cardiac medications- Continue strict I's and O's and daily weight  Hypertension Blood pressure is at goal -Continue with low-dose Coreg -Positive orthostatic vital sign  Hyperlipidemia -Continue with statin  Ambulatory dysfunction. PT OT to assess Continue fall precautions  Aortic aneurysm, incidentally found Measures 3.1 cm Follow up outpatient    DVT Prophylaxis: SCDs.    Family Communication:  We will call family if reviewed with the patient.   Code Status Full  Disposition: Patient is from home.  Anticipate dc to home in the next 48 to 72 hours once her acute pancreatitis has resolved, GI signs off, and she is able to tolerate a soft diet.  Condition GUARDED    Consults:  GI [place]     Objective: Vitals:   11/30/19 0425 11/30/19 1430 11/30/19 2051 12/01/19 0548  BP: (!) 109/56 130/62 118/70 138/81  Pulse: 87 91 81 77  Resp: 15 14 14 17   Temp: 98.1 F (36.7 C) 98.7 F (37.1 C) 98.2 F (36.8 C) 98.4 F (36.9 C)  TempSrc: Oral Oral Oral Oral  SpO2: 98% 95% 93% 97%  Weight:      Height:        Intake/Output Summary (Last 24 hours) at 12/01/2019 1124 Last data filed at 12/01/2019 0448 Gross per 24 hour  Intake 1255.97 ml  Output --  Net 1255.97 ml  Filed Weights   11/29/19 0839  Weight: 53.1 kg    Exam:  . General: 59 y.o. year-old female pleasant well-developed well-nourished no acute distress.  Alert and oriented x3.   . Cardiovascular: Regular rate and rhythm no rubs or gallops. [name] Respiratory: Clear to auscultation with no wheezes or rales.    . Abdomen: Soft mildly tender diffusely with palpation.  Bowel sounds present. . Musculoskeletal: No lower extremity edema bilaterally.   [name] Psychiatry: Mood is appropriate for condition and setting.  Data Reviewed: CBC: Recent Labs  Lab 11/29/19 0909 11/30/19 0552 12/01/19 0557  WBC 10.7* 8.2 6.6  NEUTROABS 8.3*  --   --   HGB 15.5* 12.6 13.2  HCT 47.7* 39.9 41.4  MCV 99.0 102.6* 100.2*  PLT 354 276 [id number]   Basic Metabolic Panel: Recent Labs  Lab 11/29/19 0909 11/30/19 0552 12/01/19 0557  NA 135 138 139  K 4.0 3.9 3.4*  CL 99 108 105  CO2 25 23 27   GLUCOSE 91 72 116*  BUN 12 10 <5*  CREATININE 0.56 0.51 0.44  CALCIUM 9.1 8.4* 8.7*   GFR: Estimated Creatinine Clearance: 54.4 mL/min (by C-G formula based on SCr of 0.44 mg/dL). Liver Function Tests: Recent Labs  Lab 11/29/19 0909 11/30/19 0552  AST 18 14*  ALT 16 11  ALKPHOS 58 42  BILITOT 0.5 0.6  PROT 7.2 5.6*  ALBUMIN 4.1 3.0*   Recent Labs  Lab 11/29/19 0909 11/30/19 0552 12/01/19 0557  LIPASE 329* 556* 336*   No results for input(s): AMMONIA in the last 168 hours. Coagulation Profile: No results for input(s): INR, PROTIME in the last 168 hours. Cardiac Enzymes: No results for input(s): CKTOTAL, CKMB, CKMBINDEX, TROPONINI in the last 168 hours. BNP (last 3 results) No results for input(s): PROBNP in the last 8760 hours. HbA1C: No results for input(s): HGBA1C in the last 72 hours. CBG: No results for input(s): GLUCAP in the last 168 hours. Lipid Profile: No results for input(s): CHOL, HDL, LDLCALC, TRIG, CHOLHDL, LDLDIRECT in the last 72 hours. Thyroid Function Tests: No results for input(s): TSH, T4TOTAL, FREET4, T3FREE, THYROIDAB in the last 72 hours. Anemia Panel: No results for input(s): VITAMINB12, FOLATE, FERRITIN, TIBC, IRON, RETICCTPCT in the last 72 hours. Urine analysis:    Component Value Date/Time   COLORURINE YELLOW 11/06/2019 2151   APPEARANCEUR HAZY (A) 11/06/2019 2151    LABSPEC 1.019 11/06/2019 2151   PHURINE 6.0 11/06/2019 2151   GLUCOSEU NEGATIVE 11/06/2019 2151   HGBUR SMALL (A) 11/06/2019 2151   BILIRUBINUR NEGATIVE 11/06/2019 2151   BILIRUBINUR Negative 09/18/2019 1137   KETONESUR 20 (A) 11/06/2019 2151   PROTEINUR 100 (A) 11/06/2019 2151   UROBILINOGEN negative (A) 09/18/2019 1137   NITRITE NEGATIVE 11/06/2019 2151   LEUKOCYTESUR SMALL (A) 11/06/2019 2151   Sepsis Labs: @LABRCNTIP (procalcitonin:4,lacticidven:4)  ) Recent Results (from the past 240 hour(s))  SARS CORONAVIRUS 2 (TAT 6-24 HRS) Nasopharyngeal Nasopharyngeal Swab     Status: None   Collection Time: 11/28/19  9:51 AM   Specimen: Nasopharyngeal Swab  Result Value Ref Range Status   SARS Coronavirus 2 NEGATIVE NEGATIVE Final    Comment: (NOTE) SARS-CoV-2 target nucleic acids are NOT DETECTED. The SARS-CoV-2 RNA is generally detectable in upper and lower respiratory specimens during the acute phase of infection. Negative results do not preclude SARS-CoV-2 infection, do not rule out co-infections with other pathogens, and should not be used as the sole basis for treatment or other patient management decisions. Negative results must  be combined with clinical observations, patient history, and epidemiological information. The expected result is Negative. Fact Sheet for Patients: SugarRoll.be Fact Sheet for Healthcare Providers: https://www.woods-mathews.com/ This test is not yet approved or cleared by the [place] FDA and  has been authorized for detection and/or diagnosis of SARS-CoV-2 by FDA under an Emergency Use Authorization (EUA). This EUA will remain  in effect (meaning this test can be used) for the duration of the COVID-19 declaration under Section 56 4(b)(1) of the Act, 21 U.S.C. section 360bbb-3(b)(1), unless the authorization is terminated or revoked sooner. Performed at [facility], [place] [address]  [place], [place] [address]   SARS CORONAVIRUS 2 (TAT 6-24 HRS) Nasopharyngeal Nasopharyngeal Swab     Status: None   Collection Time: 11/29/19 12:20 PM   Specimen: Nasopharyngeal Swab  Result Value Ref Range Status   SARS Coronavirus 2 NEGATIVE NEGATIVE Final    Comment: (NOTE) SARS-CoV-2 target nucleic acids are NOT DETECTED. The SARS-CoV-2 RNA is generally detectable in upper and lower respiratory specimens during the acute phase of infection. Negative results do not preclude SARS-CoV-2 infection, do not rule out co-infections with other pathogens, and should not be used as the sole basis for treatment or other patient management decisions. Negative results must be combined with clinical observations, patient history, and epidemiological information. The expected result is Negative. Fact Sheet for Patients: SugarRoll.be Fact Sheet for Healthcare Providers: https://www.woods-mathews.com/ This test is not yet approved or cleared by the [place] FDA and  has been authorized for detection and/or diagnosis of SARS-CoV-2 by FDA under an Emergency Use Authorization (EUA). This EUA will remain  in effect (meaning this test can be used) for the duration of the COVID-19 declaration under Section 56 4(b)(1) of the Act, 21 U.S.C. section 360bbb-3(b)(1), unless the authorization is terminated or revoked sooner. Performed at [facility], [place] [address], [place], [place] [address]       Studies: No results found.  Scheduled Meds: . carvedilol  3.125 mg Oral BID  . [START ON 12/02/2019] pantoprazole  40 mg Oral Daily  . potassium chloride  20 mEq Oral Once  . pravastatin  20 mg Oral QPM    Continuous Infusions: . dextrose 5% lactated ringers 75 mL/hr at 12/01/19 0107     LOS: 1 day     Kayleen Memos, MD [facility] Pager [phone]  If 7PM-7AM, please contact night-coverage [email or web address] Password [facility] 12/01/2019, 11:24  AM

## 2019-12-01 NOTE — Progress Notes (Addendum)
Daily Rounding Note  12/01/2019, 10:55 AM  LOS: 1 day   SUBJECTIVE:   Chief complaint: Acute pancreatitis, likely pancreatic pseudocyst. Patient having formed, brown stools 1 yesterday, 1 today.  Abdominal pain improved.  Some queasiness.  Appetite minimal and taking small amounts of clear liquids.  OBJECTIVE:         Vital signs in last 24 hours:    Temp:  [98.2 F (36.8 C)-98.7 F (37.1 C)] 98.4 F (36.9 C) (03/07 0548) Pulse Rate:  [77-91] 77 (03/07 0548) Resp:  [14-17] 17 (03/07 0548) BP: (118-138)/(62-81) 138/81 (03/07 0548) SpO2:  [93 %-97 %] 97 % (03/07 0548) Last BM Date: 11/30/19 Filed Weights   11/29/19 0839  Weight: 53.1 kg   General: Looks chronically ill, not toxic.  Laying in bed and comfortable. Heart: RRR. Chest: Clear bilaterally.  No labored breathing. Abdomen: Soft.  Not tender, not distended. Extremities: No CCE. Neuro/Psych: Oriented x3.  No gross deficits.  Moves all 4 limbs.  Intake/Output from previous day: 03/06 0701 - 03/07 0700 In: 1556 [I.V.:1556] Out: -   Intake/Output this shift: No intake/output data recorded.  Lab Results: Recent Labs    11/29/19 0909 11/30/19 0552 12/01/19 0557  WBC 10.7* 8.2 6.6  HGB 15.5* 12.6 13.2  HCT 47.7* 39.9 41.4  PLT 354 276 313   BMET Recent Labs    11/29/19 0909 11/30/19 0552 12/01/19 0557  NA 135 138 139  K 4.0 3.9 3.4*  CL 99 108 105  CO2 25 23 27   GLUCOSE 91 72 116*  BUN 12 10 <5*  CREATININE 0.56 0.51 0.44  CALCIUM 9.1 8.4* 8.7*   LFT Recent Labs    11/29/19 0909 11/30/19 0552  PROT 7.2 5.6*  ALBUMIN 4.1 3.0*  AST 18 14*  ALT 16 11  ALKPHOS 58 42  BILITOT 0.5 0.6   PT/INR No results for input(s): LABPROT, INR in the last 72 hours. Hepatitis Panel No results for input(s): HEPBSAG, HCVAB, HEPAIGM, HEPBIGM in the last 72 hours.  Studies/Results: MR BRAIN WO CONTRAST  Result Date: 11/29/2019 CLINICAL DATA:   Worsening right-sided weakness EXAM: MRI HEAD WITHOUT CONTRAST TECHNIQUE: Multiplanar, multiecho pulse sequences of the brain and surrounding structures were obtained without intravenous contrast. COMPARISON:  2018 FINDINGS: Brain: There is no acute infarction or intracranial hemorrhage. There is no intracranial mass, mass effect, or edema. There is no hydrocephalus or extra-axial fluid collection. There are small chronic cortical/subcortical infarcts involving the left frontal and parietal lobes. There is a chronic infarct involving the left basal ganglia and corona radiata with extension into the temporal lobe. Minor chronic blood products associated with some of these infarcts. Additional patchy foci of T2 hyperintensity in the supratentorial white matter are nonspecific but likely reflect mild chronic microvascular ischemic changes. There is ex vacuo dilatation of the left lateral ventricle. Vascular: Major vessel flow voids at the skull base are preserved. Skull and upper cervical spine: Normal marrow signal is preserved. Sinuses/Orbits: Paranasal sinuses are aerated. Orbits are unremarkable. Other: Sella is unremarkable. Mastoid air cells are clear. IMPRESSION: No acute infarction. Chronic infarcts as described above. Mild chronic microvascular ischemic changes. Electronically Signed   By: Macy Mis M.D.   On: 11/29/2019 13:13   DG Hip Unilat With Pelvis 2-3 Views Right  Result Date: 11/29/2019 CLINICAL DATA:  Right hip pain secondary to a fall yesterday. EXAM: DG HIP (WITH OR WITHOUT PELVIS) 2-3V RIGHT COMPARISON:  CT scan of  the abdomen and pelvis dated 11/07/2019 FINDINGS: There is no evidence of hip fracture or dislocation. Minimal osteophyte formation on the superolateral aspect of the right acetabulum. IMPRESSION: No acute abnormality. Electronically Signed   By: Lorriane Shire M.D.   On: 11/29/2019 12:35    ASSESMENT:   *   Recurrent acute pancreatitis.  2.1 cm pancreatic cystic lesion,  likely pseudocyst. Clinically feeling better.  *   Anemia.  Iron on hold.  *    Mild hypokalemia at 3.4.   PLAN   *  Plan EUS on Monday 12:30 PM..  [facility] has her scheduled for transport.  *  Ordered 20 mEq oral potassium.  BMET pending for tomorrow morning.  *  Switch to oral Protonix   [name]  12/01/2019, 10:55 AM Phone [phone]    Attending Physician Note   I have taken an interval history, reviewed the chart and examined the patient. I agree with the Advanced Practitioner's note, impression and recommendations.   Recurrent pancreatitis, pancreatic cystic lesion. Abdominal pain, tenderness has improved. Continue current mgmt with IVF, clear liquids. EUS tomorrow with [name] Landmark.   Lucio Edward, MD [facility]

## 2019-12-01 NOTE — H&P (View-Only) (Signed)
Daily Rounding Note  12/01/2019, 10:55 AM  LOS: 1 day   SUBJECTIVE:   Chief complaint: Acute pancreatitis, likely pancreatic pseudocyst. Patient having formed, brown stools 1 yesterday, 1 today.  Abdominal pain improved.  Some queasiness.  Appetite minimal and taking small amounts of clear liquids.  OBJECTIVE:         Vital signs in last 24 hours:    Temp:  [98.2 F (36.8 C)-98.7 F (37.1 C)] 98.4 F (36.9 C) (03/07 0548) Pulse Rate:  [77-91] 77 (03/07 0548) Resp:  [14-17] 17 (03/07 0548) BP: (118-138)/(62-81) 138/81 (03/07 0548) SpO2:  [93 %-97 %] 97 % (03/07 0548) Last BM Date: 11/30/19 Filed Weights   11/29/19 0839  Weight: 53.1 kg   General: Looks chronically ill, not toxic.  Laying in bed and comfortable. Heart: RRR. Chest: Clear bilaterally.  No labored breathing. Abdomen: Soft.  Not tender, not distended. Extremities: No CCE. Neuro/Psych: Oriented x3.  No gross deficits.  Moves all 4 limbs.  Intake/Output from previous day: 03/06 0701 - 03/07 0700 In: 1556 [I.V.:1556] Out: -   Intake/Output this shift: No intake/output data recorded.  Lab Results: Recent Labs    11/29/19 0909 11/30/19 0552 12/01/19 0557  WBC 10.7* 8.2 6.6  HGB 15.5* 12.6 13.2  HCT 47.7* 39.9 41.4  PLT 354 276 313   BMET Recent Labs    11/29/19 0909 11/30/19 0552 12/01/19 0557  NA 135 138 139  K 4.0 3.9 3.4*  CL 99 108 105  CO2 25 23 27   GLUCOSE 91 72 116*  BUN 12 10 <5*  CREATININE 0.56 0.51 0.44  CALCIUM 9.1 8.4* 8.7*   LFT Recent Labs    11/29/19 0909 11/30/19 0552  PROT 7.2 5.6*  ALBUMIN 4.1 3.0*  AST 18 14*  ALT 16 11  ALKPHOS 58 42  BILITOT 0.5 0.6   PT/INR No results for input(s): LABPROT, INR in the last 72 hours. Hepatitis Panel No results for input(s): HEPBSAG, HCVAB, HEPAIGM, HEPBIGM in the last 72 hours.  Studies/Results: MR BRAIN WO CONTRAST  Result Date: 11/29/2019 CLINICAL DATA:   Worsening right-sided weakness EXAM: MRI HEAD WITHOUT CONTRAST TECHNIQUE: Multiplanar, multiecho pulse sequences of the brain and surrounding structures were obtained without intravenous contrast. COMPARISON:  2018 FINDINGS: Brain: There is no acute infarction or intracranial hemorrhage. There is no intracranial mass, mass effect, or edema. There is no hydrocephalus or extra-axial fluid collection. There are small chronic cortical/subcortical infarcts involving the left frontal and parietal lobes. There is a chronic infarct involving the left basal ganglia and corona radiata with extension into the temporal lobe. Minor chronic blood products associated with some of these infarcts. Additional patchy foci of T2 hyperintensity in the supratentorial white matter are nonspecific but likely reflect mild chronic microvascular ischemic changes. There is ex vacuo dilatation of the left lateral ventricle. Vascular: Major vessel flow voids at the skull base are preserved. Skull and upper cervical spine: Normal marrow signal is preserved. Sinuses/Orbits: Paranasal sinuses are aerated. Orbits are unremarkable. Other: Sella is unremarkable. Mastoid air cells are clear. IMPRESSION: No acute infarction. Chronic infarcts as described above. Mild chronic microvascular ischemic changes. Electronically Signed   By: Macy Mis M.D.   On: 11/29/2019 13:13   DG Hip Unilat With Pelvis 2-3 Views Right  Result Date: 11/29/2019 CLINICAL DATA:  Right hip pain secondary to a fall yesterday. EXAM: DG HIP (WITH OR WITHOUT PELVIS) 2-3V RIGHT COMPARISON:  CT scan of  the abdomen and pelvis dated 11/07/2019 FINDINGS: There is no evidence of hip fracture or dislocation. Minimal osteophyte formation on the superolateral aspect of the right acetabulum. IMPRESSION: No acute abnormality. Electronically Signed   By: Lorriane Shire M.D.   On: 11/29/2019 12:35    ASSESMENT:   *   Recurrent acute pancreatitis.  2.1 cm pancreatic cystic lesion,  likely pseudocyst. Clinically feeling better.  *   Anemia.  Iron on hold.  *    Mild hypokalemia at 3.4.   PLAN   *  Plan EUS on Monday 12:30 PM..  [facility] has her scheduled for transport.  *  Ordered 20 mEq oral potassium.  BMET pending for tomorrow morning.  *  Switch to oral Protonix   [name]  12/01/2019, 10:55 AM Phone [phone]    Attending Physician Note   I have taken an interval history, reviewed the chart and examined the patient. I agree with the Advanced Practitioner's note, impression and recommendations.   Recurrent pancreatitis, pancreatic cystic lesion. Abdominal pain, tenderness has improved. Continue current mgmt with IVF, clear liquids. EUS tomorrow with [name] Landmark.   Lucio Edward, MD [facility]

## 2019-12-02 ENCOUNTER — Inpatient Hospital Stay (HOSPITAL_COMMUNITY): Payer: 59 | Admitting: Physician Assistant

## 2019-12-02 ENCOUNTER — Encounter (HOSPITAL_COMMUNITY): Payer: Self-pay | Admitting: Internal Medicine

## 2019-12-02 ENCOUNTER — Encounter (HOSPITAL_COMMUNITY)
Admission: EM | Disposition: A | Discharge status: Home / Self Care | Payer: Self-pay | Source: Home / Self Care | Attending: Internal Medicine

## 2019-12-02 ENCOUNTER — Ambulatory Visit (HOSPITAL_COMMUNITY): Admission: RE | Admit: 2019-12-02 | Payer: 59 | Source: Home / Self Care | Admitting: Gastroenterology

## 2019-12-02 DIAGNOSIS — K3189 Other diseases of stomach and duodenum: Secondary | ICD-10-CM

## 2019-12-02 HISTORY — PX: ESOPHAGOGASTRODUODENOSCOPY (EGD) WITH PROPOFOL: SHX5813

## 2019-12-02 HISTORY — DX: Acute pancreatitis without necrosis or infection, unspecified: K85.90

## 2019-12-02 HISTORY — PX: UPPER ESOPHAGEAL ENDOSCOPIC ULTRASOUND (EUS): SHX6562

## 2019-12-02 HISTORY — PX: FINE NEEDLE ASPIRATION: SHX5430

## 2019-12-02 HISTORY — PX: BIOPSY: SHX5522

## 2019-12-02 LAB — CBC
HCT: 42.8 % (ref 36.0–46.0)
Hemoglobin: 13.9 g/dL (ref 12.0–15.0)
MCH: 32.3 pg (ref 26.0–34.0)
MCHC: 32.5 g/dL (ref 30.0–36.0)
MCV: 99.3 fL (ref 80.0–100.0)
Platelets: 317 10*3/uL (ref 150–400)
RBC: 4.31 MIL/uL (ref 3.87–5.11)
RDW: 13.8 % (ref 11.5–15.5)
WBC: 7.3 10*3/uL (ref 4.0–10.5)
nRBC: 0 % (ref 0.0–0.2)

## 2019-12-02 LAB — COMPREHENSIVE METABOLIC PANEL
ALT: 10 U/L (ref 0–44)
AST: 15 U/L (ref 15–41)
Albumin: 3.3 g/dL — ABNORMAL LOW (ref 3.5–5.0)
Alkaline Phosphatase: 46 U/L (ref 38–126)
Anion gap: 8 (ref 5–15)
BUN: <5 mg/dL — ABNORMAL LOW (ref 6–20)
CO2: 26 mmol/L (ref 22–32)
Calcium: 8.9 mg/dL (ref 8.9–10.3)
Chloride: 105 mmol/L (ref 98–111)
Creatinine, Ser: 0.4 mg/dL — ABNORMAL LOW (ref 0.44–1.00)
GFR calc Af Amer: >60 mL/min (ref >60)
GFR calc non Af Amer: >60 mL/min (ref >60)
Glucose, Bld: 116 mg/dL — ABNORMAL HIGH (ref 70–99)
Potassium: 3.5 mmol/L (ref 3.5–5.1)
Sodium: 139 mmol/L (ref 135–145)
Total Bilirubin: 0.3 mg/dL (ref 0.3–1.2)
Total Protein: 6 g/dL — ABNORMAL LOW (ref 6.5–8.1)

## 2019-12-02 LAB — HEPARIN LEVEL (UNFRACTIONATED): Heparin Unfractionated: 0.11 IU/mL — ABNORMAL LOW (ref 0.30–0.70)

## 2019-12-02 LAB — LIPASE, BLOOD: Lipase: 237 U/L — ABNORMAL HIGH (ref 11–51)

## 2019-12-02 LAB — PHOSPHORUS: Phosphorus: 2.1 mg/dL — ABNORMAL LOW (ref 2.5–4.6)

## 2019-12-02 LAB — APTT: aPTT: 36 seconds (ref 24–36)

## 2019-12-02 LAB — MAGNESIUM: Magnesium: 1.9 mg/dL (ref 1.7–2.4)

## 2019-12-02 SURGERY — UPPER ESOPHAGEAL ENDOSCOPIC ULTRASOUND (EUS)
Anesthesia: Monitor Anesthesia Care

## 2019-12-02 MED ORDER — CIPROFLOXACIN HCL 500 MG PO TABS: 500.0000 mg | ORAL_TABLET | Freq: Two times a day (BID) | ORAL | Status: DC

## 2019-12-02 MED ORDER — CIPROFLOXACIN IN D5W 400 MG/200ML IV SOLN
INTRAVENOUS | Status: AC
  Filled 2019-12-02: qty 200

## 2019-12-02 MED ORDER — CIPROFLOXACIN HCL 500 MG PO TABS
500.0000 mg | ORAL_TABLET | Freq: Two times a day (BID) | ORAL | Status: DC
  Administered 2019-12-02 – 2019-12-04 (×4): 500 mg via ORAL
  Filled 2019-12-02 (×4): qty 1

## 2019-12-02 MED ORDER — PROPOFOL 500 MG/50ML IV EMUL
INTRAVENOUS | Status: DC | PRN
  Administered 2019-12-02: 120 ug/kg/min via INTRAVENOUS
  Administered 2019-12-02: 80 ug/kg/min via INTRAVENOUS

## 2019-12-02 MED ORDER — POTASSIUM CHLORIDE 10 MEQ/100ML IV SOLN
10.0000 meq | INTRAVENOUS | Status: AC
  Administered 2019-12-02 (×2): 10 meq via INTRAVENOUS
  Filled 2019-12-02: qty 100

## 2019-12-02 MED ORDER — PHENYLEPHRINE 40 MCG/ML (10ML) SYRINGE FOR IV PUSH (FOR BLOOD PRESSURE SUPPORT)
PREFILLED_SYRINGE | INTRAVENOUS | Status: DC | PRN
  Administered 2019-12-02 (×2): 120 ug via INTRAVENOUS

## 2019-12-02 MED ORDER — HEPARIN (PORCINE) 25000 UT/250ML-% IV SOLN
850.0000 [IU]/h | INTRAVENOUS | Status: DC
  Administered 2019-12-02: 750 [IU]/h via INTRAVENOUS
  Administered 2019-12-04: 850 [IU]/h via INTRAVENOUS
  Filled 2019-12-02 (×2): qty 250

## 2019-12-02 MED ORDER — DICLOFENAC SODIUM 1 % EX GEL
2.0000 g | Freq: Three times a day (TID) | CUTANEOUS | Status: DC
  Administered 2019-12-02 – 2019-12-04 (×5): 2 g via TOPICAL
  Filled 2019-12-02: qty 100

## 2019-12-02 MED ORDER — GLYCOPYRROLATE PF 0.2 MG/ML IJ SOSY
PREFILLED_SYRINGE | INTRAMUSCULAR | Status: DC | PRN
  Administered 2019-12-02: .1 mg via INTRAVENOUS

## 2019-12-02 MED ORDER — FENTANYL CITRATE (PF) 100 MCG/2ML IJ SOLN
INTRAMUSCULAR | Status: DC | PRN
  Administered 2019-12-02 (×2): 25 ug via INTRAVENOUS

## 2019-12-02 MED ORDER — CIPROFLOXACIN IN D5W 400 MG/200ML IV SOLN
INTRAVENOUS | Status: DC | PRN
  Administered 2019-12-02: 400 mg via INTRAVENOUS

## 2019-12-02 MED ORDER — SODIUM CHLORIDE 0.9 % IV SOLN: INTRAVENOUS | Status: DC

## 2019-12-02 MED ORDER — LIDOCAINE 2% (20 MG/ML) 5 ML SYRINGE
INTRAMUSCULAR | Status: DC | PRN
  Administered 2019-12-02: 40 mg via INTRAVENOUS

## 2019-12-02 MED ORDER — POTASSIUM CHLORIDE 10 MEQ/100ML IV SOLN
INTRAVENOUS | Status: AC
  Filled 2019-12-02: qty 100

## 2019-12-02 MED ORDER — PROPOFOL 10 MG/ML IV BOLUS
INTRAVENOUS | Status: DC | PRN
  Administered 2019-12-02: 20 mg via INTRAVENOUS

## 2019-12-02 MED ORDER — LACTATED RINGERS IV SOLN: INTRAVENOUS | Status: DC

## 2019-12-02 NOTE — Transfer of Care (Signed)
Immediate Anesthesia Transfer of Care Note  Patient: [name]  Procedure(s) Performed: UPPER ESOPHAGEAL ENDOSCOPIC ULTRASOUND (EUS) (N/A ) ESOPHAGOGASTRODUODENOSCOPY (EGD) WITH PROPOFOL (N/A ) BIOPSY FINE NEEDLE ASPIRATION (FNA) LINEAR  Patient Location: Endoscopy Unit  Anesthesia Type:MAC  Level of Consciousness: drowsy and responds to stimulation  Airway & Oxygen Therapy: Patient Spontanous Breathing and Patient connected to nasal cannula oxygen  Post-op Assessment: Report given to RN and Post -op Vital signs reviewed and stable  Post vital signs: Reviewed and stable  Last Vitals:  Vitals Value Taken Time  BP 143/66 12/02/19 1523  Temp    Pulse 94 12/02/19 1523  Resp 17 12/02/19 1523  SpO2 98 % 12/02/19 1523    Last Pain:  Vitals:   12/02/19 1523  TempSrc: Oral  PainSc:       Patients Stated Pain Goal: 2 (02/77/41 2878)  Complications: No apparent anesthesia complications

## 2019-12-02 NOTE — Progress Notes (Signed)
[place] for IV heparin Indication: Afib w/ prior CVA  Allergies  Allergen Reactions  . Lopressor [Metoprolol Tartrate] Swelling    Angioedema  . Allegra Allergy [Fexofenadine Hcl] Hives  . Dilaudid [Hydromorphone]     Hallucinations  . Nsaids     On Xarelto  . Gabapentin (Once-Daily) Rash  . Lipitor [Atorvastatin] Diarrhea    Patient Measurements: Height: 5' (152.4 cm) Weight: 117 lb (53.1 kg) IBW/kg (Calculated) : 45.5 Heparin Dosing Weight: TBW  Vital Signs: Temp: 98.3 F (36.8 C) (03/08 1703) Temp Source: Oral (03/08 1703) BP: 138/83 (03/08 1703) Pulse Rate: 86 (03/08 1703)  Labs: Recent Labs    11/30/19 0552 11/30/19 0552 12/01/19 0557 12/02/19 0601 12/02/19 0610  HGB 12.6   < > 13.2 13.9  --   HCT 39.9  --  41.4 42.8  --   PLT 276  --  313 317  --   CREATININE 0.51  --  0.44  --  0.40*   < > = values in this interval not displayed.    Estimated Creatinine Clearance: 54.4 mL/min (A) (by C-G formula based on SCr of 0.4 mg/dL (L)).   Medical History: Past Medical History:  Diagnosis Date  . Angio-edema   . Arthritis   . CAD (coronary artery disease)    a. s/p NSTEMI in 10/2017 with cath showing LM disease --> s/p CABG on 11/23/2017 with LIMA-LAD and SVG-OM.   [name] Epigastric pain    mid  . Hay fever   . Ischemic cardiomyopathy    a. EF 35-40% by echo in 10/2017  . NSTEMI (non-ST elevated myocardial infarction) ([place])   . PAF (paroxysmal atrial fibrillation) ([place])    Diagnosed by ILR  . Pancreatitis   . Pneumonia 11/16/2017  . Stroke [facility]) 08/23/2017   Left MCA infarct status post TPA and thrombectomy  . Urticaria     Medications:  Medications Prior to Admission  Medication Sig Dispense Refill Last Dose  . acetaminophen (TYLENOL) 325 MG tablet Take 2 tablets (650 mg total) by mouth every 6 (six) hours as needed for mild pain or moderate pain (or Fever >/= 101).   11/28/2019 at Unknown time  . baclofen  (LIORESAL) 10 MG tablet Take 10 mg by mouth in the morning and at bedtime.   11/28/2019 at Unknown time  . calcium carbonate (TUMS - DOSED IN MG ELEMENTAL CALCIUM) 500 MG chewable tablet Chew 1 tablet by mouth 2 (two) times daily.    11/28/2019 at Unknown time  . carvedilol (COREG) 3.125 MG tablet Take 1 tablet by mouth twice daily (Patient taking differently: Take 3.125 mg by mouth in the morning and at bedtime. ) 180 tablet 1 11/28/2019 at 6pm  . cholecalciferol (VITAMIN D) 1000 units tablet Take 1,000 Units by mouth daily.   11/28/2019 at Unknown time  . famotidine (PEPCID) 20 MG tablet Take 20 mg by mouth daily.    11/28/2019 at Unknown time  . ferrous sulfate 325 (65 FE) MG tablet Take 325 mg by mouth 2 (two) times a week. Mondays  and Thursdays.   11/25/2019  . hydrOXYzine (ATARAX/VISTARIL) 10 MG tablet Take 1 tablet (10 mg total) by mouth at bedtime. (Patient taking differently: Take 10 mg by mouth at bedtime as needed for anxiety (sleep). ) 90 tablet 1 Past Month at Unknown time  . levocetirizine (XYZAL) 5 MG tablet Take 1 tablet (5 mg total) by mouth every evening. 60 tablet 5 11/28/2019 at Unknown time  .  mupirocin cream (BACTROBAN) 2 % Apply 1 application topically 2 (two) times daily. Apply to area on buttocks. (Patient taking differently: Apply 1 application topically 2 (two) times daily as needed (rash buttocks.). ) 15 g 0 11/28/2019 at Unknown time  . ondansetron (ZOFRAN ODT) 4 MG disintegrating tablet Take 1 tablet (4 mg total) by mouth every 8 (eight) hours as needed for nausea or vomiting. 30 tablet 0 11/28/2019 at Unknown time  . pravastatin (PRAVACHOL) 20 MG tablet Take 1 tablet (20 mg total) by mouth daily. (Patient taking differently: Take 20 mg by mouth every evening. ) 30 tablet 3 11/28/2019 at Unknown time  . rivaroxaban (XARELTO) 20 MG TABS tablet Take 1 tablet daily with evening meal 90 tablet 3 11/28/2019 at 6pm  . traMADol (ULTRAM) 50 MG tablet Take 2 tablets (100 mg total) by mouth every 6 (six)  hours as needed for moderate pain. (Patient taking differently: Take 50 mg by mouth at bedtime. ) 30 tablet 0 11/28/2019 at Unknown time  . triamcinolone cream (KENALOG) 0.1 % Apply 1 application topically 2 (two) times daily. (Patient taking differently: Apply 1 application topically 2 (two) times daily as needed (ears/rash). ) 45 g 3 11/28/2019 at Unknown time  . vitamin B-12 (CYANOCOBALAMIN) 100 MCG tablet Take 100 mcg by mouth daily.   11/28/2019 at Unknown time  . azelastine (ASTELIN) 0.1 % nasal spray Place 1-2 sprays into both nostrils 2 (two) times daily as needed for rhinitis. Use in each nostril as directed (Patient not taking: Reported on 11/29/2019) 30 mL 5 Not Taking at Unknown time   Scheduled:  . carvedilol  3.125 mg Oral BID  . ciprofloxacin  500 mg Oral BID  . diclofenac Sodium  2 g Topical TID  . pantoprazole  40 mg Oral Daily  . pravastatin  20 mg Oral QPM   Infusions:  . dextrose 5% lactated ringers 75 mL/hr at 12/02/19 0409  . heparin    . potassium chloride      Assessment: 19 yoF on Xarelto for AFib with Hx associated CVA, PMH CABG, CHF, recent pancreatitis, presents with weakness and fall. Xarelto changed to Lovenox on admission, which was itself subsequently held starting 3/7 for EGD/EUS performed 3/8 with several pancreatic/peripancreatic biopsies performed. GI prefers resuming IV heparin postprocedurally given Hx CVA as well as bleeding risk. Pharmacy to start IV heparin at 10p (6 hrs postop) with no bolus.   Baseline heparin level, aPTT: pending  Prior anticoagulation:   PTA: Xarelto 20 mg daily; LD 3/4 PM  IP: Lovenox 55 mg SQ bid; LD 3/6 PM  Significant events:  Today, 12/02/2019:  CBC: stable WNL  No bleeding or infusion issues per nursing  Goal of Therapy: Heparin level 0.3-0.7 units/ml Monitor platelets by anticoagulation protocol: Yes  Plan:  Heparin 750 units/hr IV infusion; no bolus per GI request  Check heparin level 8 hrs after start (suspect  baseline heparin level will be normal but can use aPTT if baseline heparin level elevated)  Daily CBC, daily heparin level once stable  Monitor for signs of bleeding or thrombosis  Per GI recommendations, may be able to resume Xarelto after 48 hr if no s/s bleeding on IV heparin  Reuel Boom, PharmD, BCPS [phone] 12/02/2019, 7:30 PM

## 2019-12-02 NOTE — Progress Notes (Addendum)
Patient ID: [name], female   DOB: [birth year], 60 y.o.   MRN: [id number]    Progress Note   Subjective   day # 4 CC; acute pancreatitis, pancreatic cystic lesion  Sitting up in chair, brushing teeth.  She says overall abdominal pain much improved since admission and she had been able to tolerate liquids.   Today WBC 7.3, hemoglobin 13.9 Lipase 237 Phosphorus 2.1, potassium 3.5, creatinine 0.4 LFTs within normal limits     Objective   Vital signs in last 24 hours: Temp:  [97.7 F (36.5 C)-98.2 F (36.8 C)] 97.7 F (36.5 C) (03/08 0405) Pulse Rate:  [81-84] 84 (03/08 0405) Resp:  [17-18] 17 (03/08 0405) BP: (128-139)/(68-86) 128/68 (03/08 0405) SpO2:  [95 %-98 %] 95 % (03/08 0405) Last BM Date: 12/01/19 General: older   white female in NAD Heart:  Regular rate and rhythm; no murmurs Lungs: Respirations even and unlabored, lungs CTA bilaterally Abdomen:  Soft, mildly tender across the epigastrium, no guarding. Normal bowel sounds. Extremities:  Without edema. Neurologic:  Alert and oriented,  grossly normal neurologically. Psych:  Cooperative. Normal mood and affect.  Intake/Output from previous day: 03/07 0701 - 03/08 0700 In: 900 [I.V.:900] Out: -  Intake/Output this shift: No intake/output data recorded.  Lab Results: Recent Labs    11/30/19 0552 12/01/19 0557 12/02/19 0601  WBC 8.2 6.6 7.3  HGB 12.6 13.2 13.9  HCT 39.9 41.4 42.8  PLT 276 313 317   BMET Recent Labs    11/30/19 0552 12/01/19 0557 12/02/19 0610  NA 138 139 139  K 3.9 3.4* 3.5  CL 108 105 105  CO2 23 27 26   GLUCOSE 72 116* 116*  BUN 10 <5* <5*  CREATININE 0.51 0.44 0.40*  CALCIUM 8.4* 8.7* 8.9   LFT Recent Labs    12/02/19 0610  PROT 6.0*  ALBUMIN 3.3*  AST 15  ALT 10  ALKPHOS 46  BILITOT 0.3   PT/INR No results for input(s): LABPROT, INR in the last 72 hours.  Studies/Results: No results found.     Assessment / Plan:    #82 59 year old white female with  recurrent pancreatitis, of unclear etiology, readmitted with acute mild pancreatitis Symptomatically improved since admit.  CT with 2.1 cm cystic mass within the pancreatic head, slightly enlarged since prior study, no ductal dilation.  This may be a small pseudocyst, rule out cystic neoplasm  #2 history of CVA #3 coronary artery disease status post CABG #4 congestive heart failure #5 atrial fibrillation-on chronic Xarelto-on hold since admit #6 fall at home   Plan; patient is scheduled for EUS today with [name] Landmark We will plan to gradually advance diet as tolerated post procedure.   Active Problems:   Cerebrovascular accident (CVA) due to occlusion of left middle cerebral artery (HCC)   Coronary artery disease   Benign essential HTN   Chronic diastolic CHF (congestive heart failure) (HCC)   Paroxysmal atrial fibrillation (HCC)   Abnormality of gait   Acute recurrent pancreatitis   Acute pancreatitis     LOS: 2 days   Nivia Gervase EsterwoodPA-C  12/02/2019, 8:45 AM

## 2019-12-02 NOTE — Progress Notes (Signed)
PROGRESS NOTE  [name] [id number] DOB: June 18, 1961 DOA: 11/29/2019 PCP: [name], DO  HPI/Recap of past 24 hours: [name]  is a 59 year old female with a history of CVA, CAD/two-vessel CABG, CHF, atrial fibrillation (on Xarelto),with recent hospitalization with acute pancreatitis, with no clear etiology, she presents to ED with multiple complaints, including mainly generalized weakness, lightheadedness and fall hitting her R hip no acute fx per xray.   Also reports abdominal pain, poor appetite, nausea with no vomiting.  Her symptoms worsened over last 24 hours.   ED work-up significant for lipase of 329, CT showed improvement in pancreatic head inflammation, but evidence of new inflammation in the first and second portion of the duodenum concerning for duodenitis.  TRH asked to admit.  GI [name] consulted and following.  Care directed by GI.  Planned endoscopy ultrasound at [facility]    12/02/19: Seen and examined.  No acute events overnight.  Eager for EUS.  Abdominal pain is improved.  Assessment/Plan: Active Problems:   Cerebrovascular accident (CVA) due to occlusion of left middle cerebral artery (HCC)   Coronary artery disease   Benign essential HTN   Chronic diastolic CHF (congestive heart failure) (HCC)   Paroxysmal atrial fibrillation (HCC)   Abnormality of gait   Acute recurrent pancreatitis   Acute pancreatitis  Fall/dizziness Positive orthostatic vital signs Continue fall precautions Continue gentle IV fluid hydration PT OT consulted to assess.  Recommendation for outpatient PT.  Supervision for mobility. MRI negative for acute intracranial findings  Recurrent pancreatitis with pseudocyst measuring 2.1 cm/possible duodenitis Presented with elevated lipase >500 Lipase trending down>> 230. Stable pancreatic head pseudocyst Planned EUS on 3/8 at [facility] by [name] [facility] Care managed by GI.  Hypophosphatemia Phosphorus  2.1 Replete  History of CVA -MRI negative for any acute intracranial findings -Continue with statin.  Paroxysmal A. Fib Rate controlled on Coreg Full dose subcu Lovenox on hold due to planned procedure. Resume Eliquis if okay with GI after procedure  Chronic diastolic CHF Stable. Last 2D echo: Normal LVEF 50 to 55% with grade 1 diastolic dysfunction Continue cardiac medications- Continue strict I's and O's and daily weight  Hypertension Blood pressure is at goal -Continue with low-dose Coreg -Positive orthostatic vital sign  Hyperlipidemia -Continue with statin  Ambulatory dysfunction post fall. From her right hip, x-ray showed no evidence of fracture Pain today, Voltaren gel. PT OT assessed and recommended outpatient PT Continue fall precautions  Aortic aneurysm, incidentally found Measures 3.1 cm Follow up outpatient    DVT Prophylaxis: SCDs.  Not on pharmaceutical DVT prophylaxis due to brain procedure.  Resume Eliquis if okay with GI after procedure    Family Communication:  We will call family if reviewed with the patient.   Code Status Full  Disposition: Patient is from home.  Anticipate dc to home in the next 48 to 72 hours once her acute pancreatitis has resolved, GI signs off, and she is able to tolerate a soft diet.  Condition GUARDED    Consults:  GI      Objective: Vitals:   12/01/19 1434 12/01/19 2049 12/02/19 0405 12/02/19 1140  BP: 135/73 139/86 128/68 (!) 156/75  Pulse: 83 81 84 80  Resp: 17 18 17  (!) 21  Temp: 98.2 F (36.8 C) 98.2 F (36.8 C) 97.7 F (36.5 C) 98.1 F (36.7 C)  TempSrc:  Oral Oral Temporal  SpO2: 96% 98% 95% 98%  Weight:      Height:  Intake/Output Summary (Last 24 hours) at 12/02/2019 1413 Last data filed at 12/02/2019 0856 Gross per 24 hour  Intake 900 ml  Output --  Net 900 ml   Filed Weights   11/29/19 0839  Weight: 53.1 kg    Exam:  . General: 59 y.o. year-old female  pleasant well-developed well-nourished no acute distress.  Alert and oriented x3.   . Cardiovascular: Regular rate and rhythm no rubs or gallops. [name] Respiratory: Clear to Auscultation with No Wheezes or Rales.   . Abdomen: Soft with mild tenderness with palpation. . Musculoskeletal: No lower extremity edema.  Reports pain of her right hip where she fell prior to presentation. [name] Psychiatry: Mood is appropriate for condition and setting..  Data Reviewed: CBC: Recent Labs  Lab 11/29/19 0909 11/30/19 0552 12/01/19 0557 12/02/19 0601  WBC 10.7* 8.2 6.6 7.3  NEUTROABS 8.3*  --   --   --   HGB 15.5* 12.6 13.2 13.9  HCT 47.7* 39.9 41.4 42.8  MCV 99.0 102.6* 100.2* 99.3  PLT 354 276 313 [id number]   Basic Metabolic Panel: Recent Labs  Lab 11/29/19 0909 11/30/19 0552 12/01/19 0557 12/02/19 0610  NA 135 138 139 139  K 4.0 3.9 3.4* 3.5  CL 99 108 105 105  CO2 25 23 27 26   GLUCOSE 91 72 116* 116*  BUN 12 10 <5* <5*  CREATININE 0.56 0.51 0.44 0.40*  CALCIUM 9.1 8.4* 8.7* 8.9  MG  --   --   --  1.9  PHOS  --   --   --  2.1*   GFR: Estimated Creatinine Clearance: 54.4 mL/min (A) (by C-G formula based on SCr of 0.4 mg/dL (L)). Liver Function Tests: Recent Labs  Lab 11/29/19 0909 11/30/19 0552 12/02/19 0610  AST 18 14* 15  ALT 16 11 10   ALKPHOS 58 42 46  BILITOT 0.5 0.6 0.3  PROT 7.2 5.6* 6.0*  ALBUMIN 4.1 3.0* 3.3*   Recent Labs  Lab 11/29/19 0909 11/30/19 0552 12/01/19 0557 12/02/19 0601  LIPASE 329* 556* 336* 237*   No results for input(s): AMMONIA in the last 168 hours. Coagulation Profile: No results for input(s): INR, PROTIME in the last 168 hours. Cardiac Enzymes: No results for input(s): CKTOTAL, CKMB, CKMBINDEX, TROPONINI in the last 168 hours. BNP (last 3 results) No results for input(s): PROBNP in the last 8760 hours. HbA1C: No results for input(s): HGBA1C in the last 72 hours. CBG: No results for input(s): GLUCAP in the last 168 hours. Lipid Profile: No  results for input(s): CHOL, HDL, LDLCALC, TRIG, CHOLHDL, LDLDIRECT in the last 72 hours. Thyroid Function Tests: No results for input(s): TSH, T4TOTAL, FREET4, T3FREE, THYROIDAB in the last 72 hours. Anemia Panel: No results for input(s): VITAMINB12, FOLATE, FERRITIN, TIBC, IRON, RETICCTPCT in the last 72 hours. Urine analysis:    Component Value Date/Time   COLORURINE YELLOW 11/06/2019 2151   APPEARANCEUR HAZY (A) 11/06/2019 2151   LABSPEC 1.019 11/06/2019 2151   PHURINE 6.0 11/06/2019 2151   GLUCOSEU NEGATIVE 11/06/2019 2151   HGBUR SMALL (A) 11/06/2019 2151   BILIRUBINUR NEGATIVE 11/06/2019 2151   BILIRUBINUR Negative 09/18/2019 1137   KETONESUR 20 (A) 11/06/2019 2151   PROTEINUR 100 (A) 11/06/2019 2151   UROBILINOGEN negative (A) 09/18/2019 1137   NITRITE NEGATIVE 11/06/2019 2151   LEUKOCYTESUR SMALL (A) 11/06/2019 2151   Sepsis Labs: @LABRCNTIP (procalcitonin:4,lacticidven:4)  ) Recent Results (from the past 240 hour(s))  SARS CORONAVIRUS 2 (TAT 6-24 HRS) Nasopharyngeal Nasopharyngeal Swab  Status: None   Collection Time: 11/28/19  9:51 AM   Specimen: Nasopharyngeal Swab  Result Value Ref Range Status   SARS Coronavirus 2 NEGATIVE NEGATIVE Final    Comment: (NOTE) SARS-CoV-2 target nucleic acids are NOT DETECTED. The SARS-CoV-2 RNA is generally detectable in upper and lower respiratory specimens during the acute phase of infection. Negative results do not preclude SARS-CoV-2 infection, do not rule out co-infections with other pathogens, and should not be used as the sole basis for treatment or other patient management decisions. Negative results must be combined with clinical observations, patient history, and epidemiological information. The expected result is Negative. Fact Sheet for Patients: SugarRoll.be Fact Sheet for Healthcare Providers: https://www.woods-mathews.com/ This test is not yet approved or cleared by the  [place] FDA and  has been authorized for detection and/or diagnosis of SARS-CoV-2 by FDA under an Emergency Use Authorization (EUA). This EUA will remain  in effect (meaning this test can be used) for the duration of the COVID-19 declaration under Section 56 4(b)(1) of the Act, 21 U.S.C. section 360bbb-3(b)(1), unless the authorization is terminated or revoked sooner. Performed at [facility], [place] [address] [place], [place] [address]   SARS CORONAVIRUS 2 (TAT 6-24 HRS) Nasopharyngeal Nasopharyngeal Swab     Status: None   Collection Time: 11/29/19 12:20 PM   Specimen: Nasopharyngeal Swab  Result Value Ref Range Status   SARS Coronavirus 2 NEGATIVE NEGATIVE Final    Comment: (NOTE) SARS-CoV-2 target nucleic acids are NOT DETECTED. The SARS-CoV-2 RNA is generally detectable in upper and lower respiratory specimens during the acute phase of infection. Negative results do not preclude SARS-CoV-2 infection, do not rule out co-infections with other pathogens, and should not be used as the sole basis for treatment or other patient management decisions. Negative results must be combined with clinical observations, patient history, and epidemiological information. The expected result is Negative. Fact Sheet for Patients: SugarRoll.be Fact Sheet for Healthcare Providers: https://www.woods-mathews.com/ This test is not yet approved or cleared by the [place] FDA and  has been authorized for detection and/or diagnosis of SARS-CoV-2 by FDA under an Emergency Use Authorization (EUA). This EUA will remain  in effect (meaning this test can be used) for the duration of the COVID-19 declaration under Section 56 4(b)(1) of the Act, 21 U.S.C. section 360bbb-3(b)(1), unless the authorization is terminated or revoked sooner. Performed at [facility], [place] [address], [place], [place] [address]       Studies: No results  found.  Scheduled Meds: . [MAR Hold] carvedilol  3.125 mg Oral BID  . [MAR Hold] diclofenac Sodium  2 g Topical TID  . [MAR Hold] pantoprazole  40 mg Oral Daily  . [MAR Hold] pravastatin  20 mg Oral QPM    Continuous Infusions: . dextrose 5% lactated ringers 75 mL/hr at 12/02/19 0409  . lactated ringers    . potassium chloride       LOS: 2 days     Kayleen Memos, MD [facility] Pager [phone]  If 7PM-7AM, please contact night-coverage [email or web address] Password [facility] 12/02/2019, 2:13 PM

## 2019-12-02 NOTE — Interval H&P Note (Signed)
History and Physical Interval Note:  12/02/2019 1:40 PM  [name]  has presented today for surgery, with the diagnosis of pancreatitis, mid epigastric pain.  The various methods of treatment have been discussed with the patient and family. After consideration of risks, benefits and other options for treatment, the patient has consented to  Procedure(s): UPPER ESOPHAGEAL ENDOSCOPIC ULTRASOUND (EUS) (N/A) ESOPHAGOGASTRODUODENOSCOPY (EGD) WITH PROPOFOL (N/A) as a surgical intervention.  The patient's history has been reviewed, patient examined, no change in status, stable for surgery.  I have reviewed the patient's chart and labs.  Questions were answered to the patient's satisfaction.    The risks of EUS including bleeding, infection, aspiration pneumonia and intestinal perforation were discussed as was the possibility it may not give a definitive diagnosis.  If a biopsy of the pancreas is done as part of the EUS, there is an additional risk of pancreatitis at the rate of about 1%.  It was explained that procedure related pancreatitis is typically mild, although can be severe and even life threatening, which is why we do not perform random pancreatic biopsies and only biopsy a lesion we feel is concerning enough to warrant the risk.  With her current bout of pancreatitis, I will have some limitation to truly know if patient may have changes of chronic pancreatitis as a potential cause of issues due to acute inflammation.  As well, the chance of a supre-pancreatitis on top of her current bout has to be considered.  Risk of bleeding also increased in her setting of anticoagulation.  Discussed and will decide while I am in procedure as to potential pancreatitic biopsy and pancreatic cyst fluid evaluation.    The risks and benefits of endoscopic evaluation were discussed with the patient; these include but are not limited to the risk of perforation, infection, bleeding, missed lesions, lack of  diagnosis, severe illness requiring hospitalization, as well as anesthesia and sedation related illnesses.  The patient is agreeable to proceed.    [employer]

## 2019-12-02 NOTE — Anesthesia Procedure Notes (Signed)
Procedure Name: MAC Date/Time: 12/02/2019 1:55 PM Performed by: Janace Litten, CRNA Pre-anesthesia Checklist: Patient identified, Emergency Drugs available, Suction available and Patient being monitored Patient Re-evaluated:Patient Re-evaluated prior to induction Oxygen Delivery Method: Nasal cannula

## 2019-12-02 NOTE — Op Note (Addendum)
[facility] Patient Name: [name] Procedure Date : 12/02/2019 MRN: [id number] Attending MD: Justice Britain , MD Date of Birth: [birth year] CSN: [id number] Age: 59 Admit Type: Inpatient Procedure:                Upper EUS Indications:              Exclusion of chronic pancreatitis, Acute recurrent                            pancreatitis Providers:                Justice Britain, MD, Carlyn Reichert, RN, Lina Sar, Technician, Mora Appl, CRNA Referring MD:             Gerrit Heck, MD, Mauri Pole, MD, [facility] Medicines:                Monitored Anesthesia Care, Cipro 935 mg IV Complications:            No immediate complications. Estimated Blood Loss:     Estimated blood loss was minimal. Procedure:                Pre-Anesthesia Assessment:                           - Prior to the procedure, a History and Physical                            was performed, and patient medications and                            allergies were reviewed. The patient's tolerance of                            previous anesthesia was also reviewed. The risks                            and benefits of the procedure and the sedation                            options and risks were discussed with the patient.                            All questions were answered, and informed consent                            was obtained. Prior Anticoagulants: The patient has                            taken Lovenox (enoxaparin), last dose was 1 day  prior to procedure. ASA Grade Assessment: III - A                            patient with severe systemic disease. After                            reviewing the risks and benefits, the patient was                            deemed in satisfactory condition to undergo the                            procedure.                           After  obtaining informed consent, the endoscope was                            passed under direct vision. Throughout the                            procedure, the patient's blood pressure, pulse, and                            oxygen saturations were monitored continuously. The                            [id number]-H190 ([id number]) Olympus gastroscope was                            introduced through the mouth, and advanced to the                            second part of duodenum. The [id number]-[id number] ([id number])                            Franklin was introduced through the                            mouth, and advanced to the second part of duodenum.                            The [id number] ([id number]) Olympus Linear EUS scope                            was introduced through the mouth, and advanced to                            the duodenum for ultrasound examination from the                            stomach and duodenum. The upper EUS was  accomplished without difficulty. The patient                            tolerated the procedure. Scope In: Scope Out: Findings:      ENDOSCOPIC FINDING: :      No gross lesions were noted in the entire esophagus.      The Z-line was irregular and was found 39 cm from the incisors.      Patchy mildly erythematous mucosa without bleeding was found in the       gastric body, at the incisura and in the gastric antrum. Biopsies were       taken with a cold forceps for histology and Helicobacter pylori testing.      Localized moderate mucosal changes characterized by congestion, erythema       and inflammation were founda at the apex of the duodenal bulb into D1/D2       sweep. Biopsies were taken with a cold forceps for histology.      The ampulla was normal.      ENDOSONOGRAPHIC FINDING: :      The head of the pancreas was irregular compared to the rest of the       pancreas parenchyma. The area was hypoechoic in nature. Although no        overt boundaries noted, the area in question measured 29 mm by 22 mm in       maximal cross-sectional diameter. The endosonographic borders were       poorly-defined. An intact interface was seen between the area and the       superior mesenteric artery and celiac trunk suggesting a lack of       invasion. The remainder of the pancreas was examined. There was evidence       of hyperechoic stranding throughout the neck/body/tail region. Very       minimal lobularity was noted in the tail of the pancreas. The       endosonographic appearance of parenchyma and the upstream pancreatic       duct indicated a normal appearing duct (PDH - 1.0 mm -> 1.8 mm, PDN -       1.7 mm, PDB - 0.8 mm, PDT - 1.0 mm). Fine needle biopsy was performed of       the area in order to exclude malignancy due to the changes noted. Color       Doppler imaging was utilized prior to needle puncture to confirm a lack       of significant vascular structures within the needle path. Five passes       were made with the 22 gauge ultrasound core biopsy needle using a       transduodenal approach. Visible cores of tissue were obtained.       Preliminary cytologic examination and touch preps were performed. Final       cytology results are pending.      An anechoic lesion suggestive of a cyst was identified in the       peripancreatic region - based on the EUS it seems it is coming from the       duodenal wall (query groove pancreatitis). The cyst measured 17 mm by 11       mm in maximal cross-sectional diameter. There were 2 compartments       without septae. The outer wall of the lesion was thin. There was no  associated mass. Diagnostic needle aspiration for fluid was performed.       Color Doppler imaging was utilized prior to needle puncture to confirm a       lack of significant vascular structures within the needle path. One pass       was made with the Acquire 22 gauge needle using a transduodenal        approach. A stylet was used. The amount of fluid collected was 4 mL. The       fluid was serosanguinous. Sample were sent for cytology. Unable to have       enough fluid to send for CEA/Amylase evaluation (though again this seems       to be arising from the duodenal wall rather than pancreas).      There was no sign of significant endosonographic abnormality in the       common bile duct (3.5 mm) and in the common hepatic duct (5.0 mm).      Moderate hyperechoic material consistent with sludge was visualized       endosonographically in the gallbladder but no stones.      Endosonographic imaging in the visualized portion of the liver showed no       mass.      No malignant-appearing lymph nodes were visualized in the celiac region       (level 20), peripancreatic region and porta hepatis region.      The Celiac region was visualized. Impression:               EGD Impression:                           - No gross lesions in esophagus.                           - Z-line irregular, 39 cm from the incisors.                           - Erythematous mucosa in the gastric body, incisura                            and antrum. Biopsied.                           - Mucosal changes in the duodenum. Biopsied.                           - Normal ampulla.                           EUS Impression:                           - An area of hypoechogenicity was identified in the                            pancreatic head. Fine needle biopsy performed to                            exclude malignancy.                           -  A cystic lesion was seen in the peripancreatic                            region - seemed to be coming from the duodenal                            wall. Fine needle aspiration for fluid performed.                            Query groove pancreatitis.                           - There was no sign of significant pathology in the                            common bile duct and in the  common hepatic duct.                           - Hyperechoic material consistent with sludge was                            visualized endosonographically in the gallbladder                            but no stones.                           - No malignant-appearing lymph nodes were                            visualized in the celiac region (level 20),                            peripancreatic region and porta hepatis region. Recommendation:           - The patient will be observed post-procedure,                            until all discharge criteria are met.                           - Return patient to hospital ward for ongoing care.                           - Advance diet as tolerated.                           - Observe patient's clinical course.                           - Ideally would hold anticoagulation for a total of                            48-72 hours. But due to prior stroke, I think  anticoagulation and risk of bleeding should be                            considered. Would consider IV heparin drip without                            bolus to begin around 1000 PM (6 hours from                            biopsies) and monitor closely over next 24-48 hours                            before being transitioned back to a NOAC.                           - Continue Ciprofloxacin 500 mg BID x 72 hours.                           - Await cytology results and await path results.                           - If pathology/cytology is unremarkable or more                            consistent with inflammation (most likely) then may                            not need repeat EUS. If no identifiable etiology of                            Pancreatitis is found then consider Genetics                            testing and also consider ERCP Manometry in future                            to rule out Functional Disorder of the Sphincter                             (formerly SOD).                           - The findings and recommendations were discussed                            with the patient.                           - The findings and recommendations were discussed                            with the patient's family. Procedure Code(s):        --- Professional ---  43238, Esophagogastroduodenoscopy, flexible,                            transoral; with transendoscopic ultrasound-guided                            intramural or transmural fine needle                            aspiration/biopsy(s), (includes endoscopic                            ultrasound examination limited to the esophagus,                            stomach or duodenum, and adjacent structures) Diagnosis Code(s):        --- Professional ---                           K22.8, Other specified diseases of esophagus                           K31.89, Other diseases of stomach and duodenum                           K86.89, Other specified diseases of pancreas                           K86.2, Cyst of pancreas                           I89.9, Noninfective disorder of lymphatic vessels                            and lymph nodes, unspecified                           K85.90, Acute pancreatitis without necrosis or                            infection, unspecified                           K83.8, Other specified diseases of biliary tract CPT copyright 2019 [employer]. All rights reserved. The codes documented in this report are preliminary and upon coder review may  be revised to meet current compliance requirements. Justice Britain, MD 12/02/2019 4:00:57 PM Number of Addenda: 0

## 2019-12-02 NOTE — Evaluation (Signed)
Occupational Therapy Evaluation Patient Details Name: [name] MRN: [id number] DOB: [birth year] Today's Date: 12/02/2019    History of Present Illness 59yo female c/o weakness, light-headedness, multiple falls, abdominal pain. CTH/MRI negative for acute changes, imaging negative for LE fracture; however abdominal CT does show duodenal inflammation and pancreatitis. PMH CAD, cardiomyopathy, NSTEMI, A-fib, CVA with R residual weakness, CABG, cardiac cath   Clinical Impression   Patient is a 59 year old female that lives with family in a single level home with 2 steps to enter. At baseline patient is independent with self care and uses a quad cane intermittently. Currently, patient is min guard for functional mobility and transfers without AD, patient reports sharp pain in R hip with movement and is mildly unsteady. Patient set up with UB ADL, min guard LB ADLs. Patient is concerned that she has lost strength in her right upper and lower extremity since issues with her pancreas began few months ago. Educate patient in A/AROM exercises to perform while in bed or chair for R UE, patient verbalize understanding. Recommend continued acute OT services to maximize patient independence and safety with self care.    Follow Up Recommendations  Outpatient OT    Equipment Recommendations  None recommended by OT       Precautions / Restrictions Precautions Precautions: Fall Precaution Comments: R hemiparesis Restrictions Weight Bearing Restrictions: No      Mobility Bed Mobility Overal bed mobility: Modified Independent             General bed mobility comments: use of bed rails, increased time  Transfers Overall transfer level: Needs assistance Equipment used: None Transfers: Sit to/from Stand Sit to Stand: Min guard         General transfer comment: min guard for safety due to mild unsteadiness    Balance Overall balance assessment: Needs assistance Sitting-balance support:  Feet supported Sitting balance-Leahy Scale: Good     Standing balance support: During functional activity;No upper extremity supported Standing balance-Leahy Scale: Fair                             ADL either performed or assessed with clinical judgement   ADL Overall ADL's : Needs assistance/impaired Eating/Feeding: Independent   Grooming: Wash/dry face;Oral care;Brushing hair;Set up;Sitting Grooming Details (indicate cue type and reason): opted for sitting g/h due to pain in standing on R LE Upper Body Bathing: Sitting;Set up   Lower Body Bathing: Min guard;Sitting/lateral leans;Sit to/from stand   Upper Body Dressing : Set up;Sitting   Lower Body Dressing: Min guard;Sitting/lateral leans;Sit to/from stand   Toilet Transfer: Min guard;Ambulation;BSC Toilet Transfer Details (indicate cue type and reason): simulated with transfer to recliner, no use of AD and min guard for safety due to mild unsteadiness and pain in R LE Toileting- Clothing Manipulation and Hygiene: Min guard;Sit to/from stand;Sitting/lateral lean       Functional mobility during ADLs: Min guard                    Pertinent Vitals/Pain Pain Assessment: Faces Faces Pain Scale: Hurts little more Pain Location: abdomen, R leg Pain Descriptors / Indicators: Sharp;Aching;Discomfort;Other (Comment)(sharp pain relating to R hip movement) Pain Intervention(s): Monitored during session;Other (comment)(notified RN of hip pain)     Hand Dominance Right   Extremity/Trunk Assessment Upper Extremity Assessment Upper Extremity Assessment: RUE deficits/detail RUE Deficits / Details: baseline R hemiparesis from CVA, however patient reports strength has dininished  since battling pancreatitis, grossly 3-/5 MMT RUE Coordination: decreased fine motor;decreased gross motor   Lower Extremity Assessment Lower Extremity Assessment: Defer to PT evaluation   Cervical / Trunk Assessment Cervical / Trunk  Assessment: Normal   Communication Communication Communication: No difficulties   Cognition Arousal/Alertness: Awake/alert Behavior During Therapy: WFL for tasks assessed/performed Overall Cognitive Status: Within Functional Limits for tasks assessed                                                Home Living Family/patient expects to be discharged to:: Private residence Living Arrangements: Spouse/significant other Available Help at Discharge: Family;Available 24 hours/day Type of Home: House Home Access: Stairs to enter [employer] of Steps: 2 Entrance Stairs-Rails: None Home Layout: One level     Bathroom Shower/Tub: Teacher, early years/pre: Standard Bathroom Accessibility: Yes How Accessible: Accessible via wheelchair Home Equipment: Cane - quad;Bedside commode;Wheelchair - [employer] - 2 wheels   Additional Comments: family available 24/7, not on oxygen at home      Prior Functioning/Environment Level of Independence: Independent                 OT Problem List: Decreased strength;Decreased activity tolerance;Impaired balance (sitting and/or standing);Decreased safety awareness;Pain      OT Treatment/Interventions: Self-care/ADL training;Therapeutic exercise;Neuromuscular education;Energy conservation;DME and/or AE instruction;Therapeutic activities;Patient/family education;Balance training    OT Goals(Current goals can be found in the care plan section) Acute Rehab OT Goals Patient Stated Goal: go home, stop having issues with pancreas OT Goal Formulation: With patient Time For Goal Achievement: 12/16/19 Potential to Achieve Goals: Good  OT Frequency: Min 2X/week    AM-PAC OT "6 Clicks" Daily Activity     Outcome Measure Help from another person eating meals?: None Help from another person taking care of personal grooming?: A Little Help from another person toileting, which includes using toliet, bedpan, or  urinal?: A Little Help from another person bathing (including washing, rinsing, drying)?: A Little Help from another person to put on and taking off regular upper body clothing?: A Little Help from another person to put on and taking off regular lower body clothing?: A Little 6 Click Score: 19   End of Session Nurse Communication: Mobility status;Other (comment)(pain in R LE/hip)  Activity Tolerance: Patient tolerated treatment well Patient left: in chair;with call bell/phone within reach  OT Visit Diagnosis: Other abnormalities of gait and mobility (R26.89);History of falling (Z91.81);Pain Pain - Right/Left: Right Pain - part of body: Hip                Time: [id number] OT Time Calculation (min): 30 min Charges:  OT General Charges $OT Visit: 1 Visit OT Evaluation $OT Eval Moderate Complexity: 1 Mod OT Treatments $Self Care/Home Management : 8-22 mins  Shon Millet OT OT office: [place] 12/02/2019, 12:13 PM

## 2019-12-02 NOTE — Progress Notes (Signed)
Discussed with GI [name] Landmark his findings on EGD.  He recommends to continue ciprofloxacin for the next 72 hours.    For secondary CVA prevention in the setting of paroxysmal A-fib can start heparin drip at 10 pm without a bolus.  Continue hep drip for the next 48 hours.  If no evidence of bleeding, then switch to her PTA Eliquis.  We highly appreciate GI [place]'s assistance in the care of this patient.

## 2019-12-02 NOTE — Anesthesia Postprocedure Evaluation (Signed)
Anesthesia Post Note  Patient: [name]  Procedure(s) Performed: UPPER ESOPHAGEAL ENDOSCOPIC ULTRASOUND (EUS) (N/A ) ESOPHAGOGASTRODUODENOSCOPY (EGD) WITH PROPOFOL (N/A ) BIOPSY FINE NEEDLE ASPIRATION (FNA) LINEAR     Patient location during evaluation: Endoscopy Anesthesia Type: MAC Level of consciousness: awake and alert Pain management: pain level controlled Vital Signs Assessment: post-procedure vital signs reviewed and stable Respiratory status: spontaneous breathing, nonlabored ventilation, respiratory function stable and patient connected to nasal cannula oxygen Cardiovascular status: blood pressure returned to baseline and stable Postop Assessment: no apparent nausea or vomiting Anesthetic complications: no    Last Vitals:  Vitals:   12/02/19 1540 12/02/19 1545  BP: (!) 171/99 (!) 158/67  Pulse: 99 85  Resp: 18 19  Temp:    SpO2: 94% 95%    Last Pain:  Vitals:   12/02/19 1523  TempSrc: Oral  PainSc:                  Barnet Glasgow

## 2019-12-03 ENCOUNTER — Encounter: Payer: Self-pay | Admitting: *Deleted

## 2019-12-03 ENCOUNTER — Other Ambulatory Visit: Payer: Self-pay | Admitting: Physician Assistant

## 2019-12-03 DIAGNOSIS — E43 Unspecified severe protein-calorie malnutrition: Secondary | ICD-10-CM

## 2019-12-03 LAB — CBC
HCT: 42.1 % (ref 36.0–46.0)
Hemoglobin: 13.4 g/dL (ref 12.0–15.0)
MCH: 32.4 pg (ref 26.0–34.0)
MCHC: 31.8 g/dL (ref 30.0–36.0)
MCV: 101.7 fL — ABNORMAL HIGH (ref 80.0–100.0)
Platelets: 302 10*3/uL (ref 150–400)
RBC: 4.14 MIL/uL (ref 3.87–5.11)
RDW: 13.9 % (ref 11.5–15.5)
WBC: 7.6 10*3/uL (ref 4.0–10.5)
nRBC: 0 % (ref 0.0–0.2)

## 2019-12-03 LAB — HEMOGLOBIN AND HEMATOCRIT, BLOOD
HCT: 42.9 % (ref 36.0–46.0)
Hemoglobin: 13.8 g/dL (ref 12.0–15.0)

## 2019-12-03 LAB — BASIC METABOLIC PANEL
Anion gap: 7 (ref 5–15)
BUN: 5 mg/dL — ABNORMAL LOW (ref 6–20)
CO2: 26 mmol/L (ref 22–32)
Calcium: 8.9 mg/dL (ref 8.9–10.3)
Chloride: 108 mmol/L (ref 98–111)
Creatinine, Ser: 0.55 mg/dL (ref 0.44–1.00)
GFR calc Af Amer: >60 mL/min (ref >60)
GFR calc non Af Amer: >60 mL/min (ref >60)
Glucose, Bld: 114 mg/dL — ABNORMAL HIGH (ref 70–99)
Potassium: 3.6 mmol/L (ref 3.5–5.1)
Sodium: 141 mmol/L (ref 135–145)

## 2019-12-03 LAB — HEPATIC FUNCTION PANEL
ALT: 13 U/L (ref 0–44)
AST: 21 U/L (ref 15–41)
Albumin: 3.1 g/dL — ABNORMAL LOW (ref 3.5–5.0)
Alkaline Phosphatase: 47 U/L (ref 38–126)
Bilirubin, Direct: 0.1 mg/dL (ref 0.0–0.2)
Indirect Bilirubin: 0.5 mg/dL (ref 0.3–0.9)
Total Bilirubin: 0.6 mg/dL (ref 0.3–1.2)
Total Protein: 5.7 g/dL — ABNORMAL LOW (ref 6.5–8.1)

## 2019-12-03 LAB — LIPASE, BLOOD: Lipase: 80 U/L — ABNORMAL HIGH (ref 11–51)

## 2019-12-03 LAB — HEPARIN LEVEL (UNFRACTIONATED)
Heparin Unfractionated: 0.25 IU/mL — ABNORMAL LOW (ref 0.30–0.70)
Heparin Unfractionated: 0.61 IU/mL (ref 0.30–0.70)
Heparin Unfractionated: 0.62 IU/mL (ref 0.30–0.70)

## 2019-12-03 LAB — SURGICAL PATHOLOGY

## 2019-12-03 MED ORDER — METHYLPREDNISOLONE SODIUM SUCC 40 MG IJ SOLR
40.0000 mg | Freq: Once | INTRAMUSCULAR | Status: AC
  Administered 2019-12-03: 40 mg via INTRAVENOUS
  Filled 2019-12-03: qty 1

## 2019-12-03 MED ORDER — DIPHENHYDRAMINE HCL 25 MG PO CAPS
25.0000 mg | ORAL_CAPSULE | Freq: Three times a day (TID) | ORAL | Status: DC | PRN
  Administered 2019-12-03: 25 mg via ORAL
  Filled 2019-12-03: qty 1

## 2019-12-03 MED ORDER — SODIUM PHOSPHATES 45 MMOLE/15ML IV SOLN
30.0000 mmol | Freq: Once | INTRAVENOUS | Status: AC
  Administered 2019-12-03: 30 mmol via INTRAVENOUS
  Filled 2019-12-03: qty 10

## 2019-12-03 MED ORDER — DIPHENHYDRAMINE HCL 50 MG/ML IJ SOLN
25.0000 mg | Freq: Once | INTRAMUSCULAR | Status: AC
  Administered 2019-12-03: 25 mg via INTRAVENOUS
  Filled 2019-12-03: qty 1

## 2019-12-03 MED ORDER — FAMOTIDINE IN NACL 20-0.9 MG/50ML-% IV SOLN
20.0000 mg | Freq: Once | INTRAVENOUS | Status: AC
  Administered 2019-12-03: 20 mg via INTRAVENOUS
  Filled 2019-12-03: qty 50

## 2019-12-03 NOTE — Progress Notes (Addendum)
Patient ID: [name], female   DOB: 29-Apr-1961, 59 y.o.   MRN: [id number]    Progress Note   Subjective   Day # 5  CC : Acute pancreatitis, recurrent  Patient has no complaints of pain today, feels fine since procedures yesterday, trying to eat solid food.  No complaints of nausea or vomiting  EUS yesterday-aspiration of the 1.2 cm pancreatic cystic lesion, and biopsy of the head of the pancreas-path pending.  Left about surgery until after we do rectal sling is blood loss okay is anything about   Objective   Vital signs in last 24 hours: Temp:  [97.7 F (36.5 C)-98.3 F (36.8 C)] 97.7 F (36.5 C) (03/09 0446) Pulse Rate:  [79-113] 83 (03/09 0446) Resp:  [14-21] 16 (03/09 0446) BP: (128-171)/(66-124) 133/70 (03/09 0446) SpO2:  [92 %-98 %] 93 % (03/09 0446) Last BM Date: 12/03/19 General:    white female in NAD Heart:  Regular rate and rhythm; no murmurs Lungs: Respirations even and unlabored, lungs CTA bilaterally Abdomen:  Soft, nontender and nondistended. Normal bowel sounds. Extremities:  Without edema. Neurologic:  Alert and oriented,  grossly normal neurologically. Psych:  Cooperative. Normal mood and affect.  Intake/Output from previous day: 03/08 0701 - 03/09 0700 In: 900 [I.V.:700; IV Piggyback:200] Out: -  Intake/Output this shift: Total I/O In: 236 [P.O.:236] Out: -   Lab Results: Recent Labs    12/01/19 0557 12/02/19 0601 12/03/19 0620  WBC 6.6 7.3 7.6  HGB 13.2 13.9 13.4  HCT 41.4 42.8 42.1  PLT 313 317 302   BMET Recent Labs    12/01/19 0557 12/02/19 0610 12/03/19 0620  NA 139 139 141  K 3.4* 3.5 3.6  CL 105 105 108  CO2 27 26 26   GLUCOSE 116* 116* 114*  BUN <5* <5* 5*  CREATININE 0.44 0.40* 0.55  CALCIUM 8.7* 8.9 8.9   LFT Recent Labs    12/02/19 0610  PROT 6.0*  ALBUMIN 3.3*  AST 15  ALT 10  ALKPHOS 46  BILITOT 0.3   PT/INR No results for input(s): LABPROT, INR in the last 72 hours.  Studies/Results: No results  found.     Assessment / Plan:    #60 59 year old white female with acute recurrent pancreatitis, current episode mild and clinically resolved  EUS yesterday to exclude chronic pancreatitis, pancreatic lesion.-Patchy gastritis and moderate duodenitis.  Head of the pancreas irregular compared to the rest of the pancreatic parenchyma, hypoechoic.  Biopsies done,, also needle aspiration of the 17 mm cystic lesion.  Plan; low-fat diet as tolerated , Await above biopsies. Patient currently on heparin prophylactically for another 24 hours, then if no evidence of bleeding can resume Eliquis Follow hemoglobin Continue IV Cipro for another 48 hours empirically.       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 I agree with the Advanced Practitioner's note, impression and recommendations.   Recurrent acute pancreatitis s/p EUS with biopsy of head of pancreas and aspiration of cystic lesion.  Path report  pending  She is overall doing better, tolerating p.o. intake with no abdominal discomfort No abdominal tenderness on exam  Protein calorie malnutrition/weight loss:  secondary to recurrent pancreatitis Slowly advance diet as tolerated with high-protein high-calorie diet.  If biopsy results are benign with no evidence of malignancy and she continues to do better, plan for likely discharge tomorrow and follow-up in GI office in 4 to 6 weeks.  Damaris Hippo , MD [phone]

## 2019-12-03 NOTE — Progress Notes (Addendum)
[place] for IV heparin Indication: Afib w/ prior CVA  Allergies  Allergen Reactions  . Lopressor [Metoprolol Tartrate] Swelling    Angioedema  . Allegra Allergy [Fexofenadine Hcl] Hives  . Dilaudid [Hydromorphone]     Hallucinations  . Nsaids     On Xarelto  . Gabapentin (Once-Daily) Rash  . Lipitor [Atorvastatin] Diarrhea    Patient Measurements: Height: 5' (152.4 cm) Weight: 117 lb (53.1 kg) IBW/kg (Calculated) : 45.5 Heparin Dosing Weight: TBW  Vital Signs: Temp: 97.7 F (36.5 C) (03/09 0446) Temp Source: Axillary (03/09 0446) BP: 133/70 (03/09 0446) Pulse Rate: 83 (03/09 0446)  Labs: Recent Labs    12/01/19 0557 12/01/19 0557 12/02/19 0601 12/02/19 0610 12/02/19 2028 12/03/19 0620  HGB 13.2   < > 13.9  --   --  13.4  HCT 41.4  --  42.8  --   --  42.1  PLT 313  --  317  --   --  302  APTT  --   --   --   --  36  --   HEPARINUNFRC  --   --   --   --  0.11* 0.25*  CREATININE 0.44  --   --  0.40*  --  0.55   < > = values in this interval not displayed.    Estimated Creatinine Clearance: 54.4 mL/min (by C-G formula based on SCr of 0.55 mg/dL).   Medical History: Past Medical History:  Diagnosis Date  . Angio-edema   . Arthritis   . CAD (coronary artery disease)    a. s/p NSTEMI in 10/2017 with cath showing LM disease --> s/p CABG on 11/23/2017 with LIMA-LAD and SVG-OM.   [name] Epigastric pain    mid  . Hay fever   . Ischemic cardiomyopathy    a. EF 35-40% by echo in 10/2017  . NSTEMI (non-ST elevated myocardial infarction) ([place])   . PAF (paroxysmal atrial fibrillation) ([place])    Diagnosed by ILR  . Pancreatitis   . Pneumonia 11/16/2017  . Stroke [facility]) 08/23/2017   Left MCA infarct status post TPA and thrombectomy  . Urticaria     Medications:  Medications Prior to Admission  Medication Sig Dispense Refill Last Dose  . acetaminophen (TYLENOL) 325 MG tablet Take 2 tablets (650 mg total) by mouth every 6 (six)  hours as needed for mild pain or moderate pain (or Fever >/= 101).   11/28/2019 at Unknown time  . baclofen (LIORESAL) 10 MG tablet Take 10 mg by mouth in the morning and at bedtime.   11/28/2019 at Unknown time  . calcium carbonate (TUMS - DOSED IN MG ELEMENTAL CALCIUM) 500 MG chewable tablet Chew 1 tablet by mouth 2 (two) times daily.    11/28/2019 at Unknown time  . carvedilol (COREG) 3.125 MG tablet Take 1 tablet by mouth twice daily (Patient taking differently: Take 3.125 mg by mouth in the morning and at bedtime. ) 180 tablet 1 11/28/2019 at 6pm  . cholecalciferol (VITAMIN D) 1000 units tablet Take 1,000 Units by mouth daily.   11/28/2019 at Unknown time  . famotidine (PEPCID) 20 MG tablet Take 20 mg by mouth daily.    11/28/2019 at Unknown time  . ferrous sulfate 325 (65 FE) MG tablet Take 325 mg by mouth 2 (two) times a week. Mondays  and Thursdays.   11/25/2019  . hydrOXYzine (ATARAX/VISTARIL) 10 MG tablet Take 1 tablet (10 mg total) by mouth at bedtime. (Patient taking  differently: Take 10 mg by mouth at bedtime as needed for anxiety (sleep). ) 90 tablet 1 Past Month at Unknown time  . levocetirizine (XYZAL) 5 MG tablet Take 1 tablet (5 mg total) by mouth every evening. 60 tablet 5 11/28/2019 at Unknown time  . mupirocin cream (BACTROBAN) 2 % Apply 1 application topically 2 (two) times daily. Apply to area on buttocks. (Patient taking differently: Apply 1 application topically 2 (two) times daily as needed (rash buttocks.). ) 15 g 0 11/28/2019 at Unknown time  . ondansetron (ZOFRAN ODT) 4 MG disintegrating tablet Take 1 tablet (4 mg total) by mouth every 8 (eight) hours as needed for nausea or vomiting. 30 tablet 0 11/28/2019 at Unknown time  . pravastatin (PRAVACHOL) 20 MG tablet Take 1 tablet (20 mg total) by mouth daily. (Patient taking differently: Take 20 mg by mouth every evening. ) 30 tablet 3 11/28/2019 at Unknown time  . rivaroxaban (XARELTO) 20 MG TABS tablet Take 1 tablet daily with evening meal 90 tablet  3 11/28/2019 at 6pm  . traMADol (ULTRAM) 50 MG tablet Take 2 tablets (100 mg total) by mouth every 6 (six) hours as needed for moderate pain. (Patient taking differently: Take 50 mg by mouth at bedtime. ) 30 tablet 0 11/28/2019 at Unknown time  . triamcinolone cream (KENALOG) 0.1 % Apply 1 application topically 2 (two) times daily. (Patient taking differently: Apply 1 application topically 2 (two) times daily as needed (ears/rash). ) 45 g 3 11/28/2019 at Unknown time  . vitamin B-12 (CYANOCOBALAMIN) 100 MCG tablet Take 100 mcg by mouth daily.   11/28/2019 at Unknown time  . azelastine (ASTELIN) 0.1 % nasal spray Place 1-2 sprays into both nostrils 2 (two) times daily as needed for rhinitis. Use in each nostril as directed (Patient not taking: Reported on 11/29/2019) 30 mL 5 Not Taking at Unknown time   Scheduled:  . carvedilol  3.125 mg Oral BID  . ciprofloxacin  500 mg Oral BID  . diclofenac Sodium  2 g Topical TID  . pantoprazole  40 mg Oral Daily  . pravastatin  20 mg Oral QPM   Infusions:  . dextrose 5% lactated ringers 75 mL/hr at 12/02/19 2057  . heparin 750 Units/hr (12/02/19 2159)    Assessment: 5 yoF on Xarelto for AFib with Hx associated CVA, PMH CABG, CHF, recent pancreatitis, presents with weakness and fall. Xarelto changed to Lovenox on admission, which was itself subsequently held starting 3/7 for EGD/EUS performed 3/8 with several pancreatic/peripancreatic biopsies performed. GI prefers resuming IV heparin postprocedurally given Hx CVA as well as bleeding risk. Pharmacy to start IV heparin at 10p (6 hrs postop) with no bolus.   Prior anticoagulation:   PTA: Xarelto 20 mg daily; LD 3/4 PM  IP: Lovenox 55 mg SQ bid; LD 3/6 PM  Significant events:  Today, 12/03/2019:  CBC: stable WNL  HL sub-therapeutic  No bleeding or issues with infusion per RN   Goal of Therapy: Heparin level 0.3-0.7 units/ml Monitor platelets by anticoagulation protocol: Yes  Plan:  Increase heparin  infusion to 850 units/hr; no bolus per GI request  Check heparin level 6 hrs after rate change  Daily CBC, daily heparin level once stable  Monitor for signs of bleeding or thrombosis  Per GI recommendations, may be able to resume Xarelto after 48 hr if no s/s bleeding on IV heparin  Ulice Dash, PharmD, BCPS 12/03/2019, 7:35 AM

## 2019-12-03 NOTE — Progress Notes (Signed)
Pathology results from EUS done on 3/8:   FINAL MICROSCOPIC DIAGNOSIS:   A. DUODENUM, BIOPSY:  - Peptic duodenitis  - No dysplasia or malignancy identified   B. STOMACH, BIOPSY:  - Mild chronic gastritis with intestinal metaplasia  - No H. pylori identified

## 2019-12-03 NOTE — Progress Notes (Signed)
[facility] for IV heparin Indication: Afib w/ prior CVA  Allergies  Allergen Reactions  . Lopressor [Metoprolol Tartrate] Swelling    Angioedema  . Allegra Allergy [Fexofenadine Hcl] Hives  . Dilaudid [Hydromorphone]     Hallucinations  . Nsaids     On Xarelto  . Gabapentin (Once-Daily) Rash  . Lipitor [Atorvastatin] Diarrhea    Patient Measurements: Height: 5' (152.4 cm) Weight: 117 lb (53.1 kg) IBW/kg (Calculated) : 45.5 Heparin Dosing Weight: TBW  Vital Signs: Temp: 98.3 F (36.8 C) (03/09 1414) BP: 115/68 (03/09 1414) Pulse Rate: 82 (03/09 1414)  Labs: Recent Labs    12/01/19 0557 12/01/19 0557 12/02/19 0601 12/02/19 0601 12/02/19 0610 12/02/19 2028 12/03/19 0620 12/03/19 1646  HGB 13.2   < > 13.9   < >  --   --  13.4 13.8  HCT 41.4   < > 42.8  --   --   --  42.1 42.9  PLT 313  --  317  --   --   --  302  --   APTT  --   --   --   --   --  36  --   --   HEPARINUNFRC  --   --   --   --   --  0.11* 0.25* 0.62  CREATININE 0.44  --   --   --  0.40*  --  0.55  --    < > = values in this interval not displayed.    Estimated Creatinine Clearance: 54.4 mL/min (by C-G formula based on SCr of 0.55 mg/dL).   Medical History: Past Medical History:  Diagnosis Date  . Angio-edema   . Arthritis   . CAD (coronary artery disease)    a. s/p NSTEMI in 10/2017 with cath showing LM disease --> s/p CABG on 11/23/2017 with LIMA-LAD and SVG-OM.   [name] Epigastric pain    mid  . Hay fever   . Ischemic cardiomyopathy    a. EF 35-40% by echo in 10/2017  . NSTEMI (non-ST elevated myocardial infarction) ([place])   . PAF (paroxysmal atrial fibrillation) ([place])    Diagnosed by ILR  . Pancreatitis   . Pneumonia 11/16/2017  . Stroke [facility]) 08/23/2017   Left MCA infarct status post TPA and thrombectomy  . Urticaria     Medications:  Medications Prior to Admission  Medication Sig Dispense Refill Last Dose  . acetaminophen (TYLENOL) 325 MG  tablet Take 2 tablets (650 mg total) by mouth every 6 (six) hours as needed for mild pain or moderate pain (or Fever >/= 101).   11/28/2019 at Unknown time  . baclofen (LIORESAL) 10 MG tablet Take 10 mg by mouth in the morning and at bedtime.   11/28/2019 at Unknown time  . calcium carbonate (TUMS - DOSED IN MG ELEMENTAL CALCIUM) 500 MG chewable tablet Chew 1 tablet by mouth 2 (two) times daily.    11/28/2019 at Unknown time  . carvedilol (COREG) 3.125 MG tablet Take 1 tablet by mouth twice daily (Patient taking differently: Take 3.125 mg by mouth in the morning and at bedtime. ) 180 tablet 1 11/28/2019 at 6pm  . cholecalciferol (VITAMIN D) 1000 units tablet Take 1,000 Units by mouth daily.   11/28/2019 at Unknown time  . famotidine (PEPCID) 20 MG tablet Take 20 mg by mouth daily.    11/28/2019 at Unknown time  . ferrous sulfate 325 (65 FE) MG tablet Take 325 mg by mouth  2 (two) times a week. Mondays  and Thursdays.   11/25/2019  . hydrOXYzine (ATARAX/VISTARIL) 10 MG tablet Take 1 tablet (10 mg total) by mouth at bedtime. (Patient taking differently: Take 10 mg by mouth at bedtime as needed for anxiety (sleep). ) 90 tablet 1 Past Month at Unknown time  . levocetirizine (XYZAL) 5 MG tablet Take 1 tablet (5 mg total) by mouth every evening. 60 tablet 5 11/28/2019 at Unknown time  . mupirocin cream (BACTROBAN) 2 % Apply 1 application topically 2 (two) times daily. Apply to area on buttocks. (Patient taking differently: Apply 1 application topically 2 (two) times daily as needed (rash buttocks.). ) 15 g 0 11/28/2019 at Unknown time  . ondansetron (ZOFRAN ODT) 4 MG disintegrating tablet Take 1 tablet (4 mg total) by mouth every 8 (eight) hours as needed for nausea or vomiting. 30 tablet 0 11/28/2019 at Unknown time  . pravastatin (PRAVACHOL) 20 MG tablet Take 1 tablet (20 mg total) by mouth daily. (Patient taking differently: Take 20 mg by mouth every evening. ) 30 tablet 3 11/28/2019 at Unknown time  . rivaroxaban (XARELTO) 20 MG  TABS tablet Take 1 tablet daily with evening meal 90 tablet 3 11/28/2019 at 6pm  . traMADol (ULTRAM) 50 MG tablet Take 2 tablets (100 mg total) by mouth every 6 (six) hours as needed for moderate pain. (Patient taking differently: Take 50 mg by mouth at bedtime. ) 30 tablet 0 11/28/2019 at Unknown time  . triamcinolone cream (KENALOG) 0.1 % Apply 1 application topically 2 (two) times daily. (Patient taking differently: Apply 1 application topically 2 (two) times daily as needed (ears/rash). ) 45 g 3 11/28/2019 at Unknown time  . vitamin B-12 (CYANOCOBALAMIN) 100 MCG tablet Take 100 mcg by mouth daily.   11/28/2019 at Unknown time  . azelastine (ASTELIN) 0.1 % nasal spray Place 1-2 sprays into both nostrils 2 (two) times daily as needed for rhinitis. Use in each nostril as directed (Patient not taking: Reported on 11/29/2019) 30 mL 5 Not Taking at Unknown time   Scheduled:  . carvedilol  3.125 mg Oral BID  . ciprofloxacin  500 mg Oral BID  . diclofenac Sodium  2 g Topical TID  . diphenhydrAMINE  25 mg Intravenous Once  . methylPREDNISolone (SOLU-MEDROL) injection  40 mg Intravenous Once  . pantoprazole  40 mg Oral Daily  . pravastatin  20 mg Oral QPM   Infusions:  . famotidine (PEPCID) IV    . heparin 850 Units/hr (12/03/19 0836)  . sodium phosphate  Dextrose 5% IVPB 30 mmol (12/03/19 1401)    Assessment: 11 yoF on Xarelto for AFib with Hx associated CVA, PMH CABG, CHF, recent pancreatitis, presents with weakness and fall. Xarelto changed to Lovenox on admission, which was itself subsequently held starting 3/7 for EGD/EUS performed 3/8 with several pancreatic/peripancreatic biopsies performed. GI prefers resuming IV heparin postprocedurally given Hx CVA as well as bleeding risk. Pharmacy to start IV heparin at 10p (6 hrs postop) with no bolus.   Prior anticoagulation:   PTA: Xarelto 20 mg daily; LD 3/4 PM  IP: Lovenox 55 mg SQ bid; LD 3/6 PM  Significant events:  Today, 12/03/2019:  1646 HL  0.62 therapeutic  No bleeding or issues with infusion noted   Goal of Therapy: Heparin level 0.3-0.7 units/ml Monitor platelets by anticoagulation protocol: Yes  Plan:  continue heparin infusion at 850 units/hr; no bolus per GI request  Confirmatory level in 6 hours  Daily CBC, daily heparin level  once stable  Monitor for signs of bleeding or thrombosis  Per GI recommendations, may be able to resume Xarelto after 48 hr if no s/s bleeding on IV heparin  Dolly Rias RPh 12/03/2019, 6:04 PM

## 2019-12-03 NOTE — Progress Notes (Addendum)
PROGRESS NOTE  [name] [id number] DOB: 1961/02/27 DOA: 11/29/2019 PCP: [name], DO  HPI/Recap of past 24 hours: [name]  is a 59 year old female with a history of CVA, CAD/two-vessel CABG, CHF, atrial fibrillation (on Xarelto),with recent hospitalization with acute pancreatitis, with no clear etiology, she presents to ED with multiple complaints, including mainly generalized weakness, lightheadedness and fall hitting her R hip no acute fx per xray.   Also reports abdominal pain, poor appetite, nausea with no vomiting.  Her symptoms worsened over last 24 hours.   ED work-up significant for lipase of 329, CT showed improvement in pancreatic head inflammation, but evidence of new inflammation in the first and second portion of the duodenum concerning for duodenitis.  TRH asked to admit.  GI [place] consulted and following.  Post EUS on 3/8 at [facility].  12/03/19:  Reports abd pain is improved.  Receptive to eat a solid diet today.   Assessment/Plan: Active Problems:   Cerebrovascular accident (CVA) due to occlusion of left middle cerebral artery (HCC)   Coronary artery disease   Benign essential HTN   Chronic diastolic CHF (congestive heart failure) (HCC)   Paroxysmal atrial fibrillation (HCC)   Abnormality of gait   Acute recurrent pancreatitis   Acute pancreatitis  Fall/dizziness Positive orthostatic vital signs Continue fall precautions Dc gentle IV fluid hydration 3/9 Started on Heart healthy diet by GI PT OT  Recommendation for outpatient PT.  Supervision for mobility. MRI negative for acute intracranial findings  Recurrent pancreatitis with pseudocyst measuring 2.1 cm/possible duodenitis Presented with elevated lipase >500 Lipase trending down>> 230>80. Stable pancreatic head pseudocyst Post EUS on 3/8 at [facility] by [name] Landmark Care managed by GI. Discussed with GI [name] Landmark his findings on EGD.  He recommends to continue ciprofloxacin for the next 72  hours.   For secondary CVA prevention in the setting of paroxysmal A-fib can start heparin drip at 10 pm without a bolus.  Continue hep drip for the next 48 hours.  If no evidence of bleeding, then switch to her PTA Xarelto. We highly appreciate GI [place]'s assistance in the care of this patient. Started on diet, monitor symptoms and lipase levels Follow biopsy taken from EUS on 3/8: Duodenal biopsy>>> Peptic duodenitis, no dysplasia or malignancy identified/ Stomach biopsy>>>Mild chronic gastritis with intestinal metaplasia No evidence of bleeding H&H is stable 13.4>> 13.8  Mild chronic gastritis with intestinal metaplasia on EUS tissue biopsy No H. Pylori Will need to follow up with GI  No family hx of gastric cancer, no personal use of alcohol or tobacco.  Peptic duodenitis on EUS tissue biopsy On daily protonix 40 mg  Management per GI  Acute urticaria, unclear etiology Unknown culprit.  IV benadryl, IV pepcid, and IV solumedrol ordered once.  Will continue po benadryl as needed if rash persists. Came to see the patient in her room this afternoon.  Noted wheals affecting bilateral buttocks and under R breast.   She states she has a history of urticaria.  This appears acute.  No signs of distress, eating her dinner.   Will treat as stated previously and continue to closely monitor.  Hypophosphatemia Phosphorus 2.1 Replete with Na+phos 30 mmol once Encourage increase po intake  History of CVA -MRI negative for any acute intracranial findings -Continue with statin.  Paroxysmal A. Fib Rate controlled on Coreg Hep drip x 48 hours from 3/8 Resume PTA Xarelto if no evidence of bleeding, pharmacy aware (consulted 3/8) H&H stable 13.4  Chronic diastolic CHF Stable. Last 2D echo: Normal LVEF 50 to 55% with grade 1 diastolic dysfunction Continue cardiac medications- Continue strict I's and O's and daily weight  Hypertension Blood pressure is at goal -Continue with low-dose  Coreg -Positive orthostatic vital sign  Hyperlipidemia -Continue with statin  Ambulatory dysfunction post fall. From her right hip, x-ray showed no evidence of fracture Pain today, Voltaren gel. PT OT assessed and recommended outpatient PT Continue fall precautions  Aortic aneurysm, incidentally found Measures 3.1 cm Follow up outpatient    DVT Prophylaxis: Hep drip   Family Communication:  We will call family if ok with the patient.   Code Status Full  Disposition: Patient is from home.  Anticipate dc to home in the next 24-48 hours once she has restarted PTA Xarelto, GI signs off, and she is able to tolerate a soft diet.  Condition GUARDED    Consults:  GI [place]     Objective: Vitals:   12/02/19 1555 12/02/19 1703 12/02/19 2115 12/03/19 0446  BP: (!) 128/99 138/83 (!) 144/96 133/70  Pulse: 79 86 84 83  Resp: 14 20 16 16   Temp:  98.3 F (36.8 C) 97.8 F (36.6 C) 97.7 F (36.5 C)  TempSrc:  Oral Axillary Axillary  SpO2: 94% 94% 97% 93%  Weight:      Height:        Intake/Output Summary (Last 24 hours) at 12/03/2019 1303 Last data filed at 12/03/2019 1013 Gross per 24 hour  Intake 1372 ml  Output --  Net 1372 ml   Filed Weights   11/29/19 0839  Weight: 53.1 kg    Exam:  . General: 59 y.o. year-old female WD WN NAD A&O  x3 . Cardiovascular: RRR no rubs or gallops . Respiratory: CTA no wheezes or rales . Abdomen: soft NT ND NBS . Musculoskeletal:No LE edema, R hip bruise from fall, poa . Skin: wheals on buttocks and underneath R breast . Psychiatry: Mood is appropriate  Data Reviewed: CBC: Recent Labs  Lab 11/29/19 0909 11/30/19 0552 12/01/19 0557 12/02/19 0601 12/03/19 0620  WBC 10.7* 8.2 6.6 7.3 7.6  NEUTROABS 8.3*  --   --   --   --   HGB 15.5* 12.6 13.2 13.9 13.4  HCT 47.7* 39.9 41.4 42.8 42.1  MCV 99.0 102.6* 100.2* 99.3 101.7*  PLT 354 276 313 317 [id number]   Basic Metabolic Panel: Recent Labs  Lab 11/29/19 0909  11/30/19 0552 12/01/19 0557 12/02/19 0610 12/03/19 0620  NA 135 138 139 139 141  K 4.0 3.9 3.4* 3.5 3.6  CL 99 108 105 105 108  CO2 25 23 27 26 26   GLUCOSE 91 72 116* 116* 114*  BUN 12 10 <5* <5* 5*  CREATININE 0.56 0.51 0.44 0.40* 0.55  CALCIUM 9.1 8.4* 8.7* 8.9 8.9  MG  --   --   --  1.9  --   PHOS  --   --   --  2.1*  --    GFR: Estimated Creatinine Clearance: 54.4 mL/min (by C-G formula based on SCr of 0.55 mg/dL). Liver Function Tests: Recent Labs  Lab 11/29/19 0909 11/30/19 0552 12/02/19 0610 12/03/19 0620  AST 18 14* 15 21  ALT 16 11 10 13   ALKPHOS 58 42 46 47  BILITOT 0.5 0.6 0.3 0.6  PROT 7.2 5.6* 6.0* 5.7*  ALBUMIN 4.1 3.0* 3.3* 3.1*   Recent Labs  Lab 11/29/19 0909 11/30/19 0552 12/01/19 0557 12/02/19 0601 12/03/19 0620  LIPASE 329*  556* 336* 237* 80*   No results for input(s): AMMONIA in the last 168 hours. Coagulation Profile: No results for input(s): INR, PROTIME in the last 168 hours. Cardiac Enzymes: No results for input(s): CKTOTAL, CKMB, CKMBINDEX, TROPONINI in the last 168 hours. BNP (last 3 results) No results for input(s): PROBNP in the last 8760 hours. HbA1C: No results for input(s): HGBA1C in the last 72 hours. CBG: No results for input(s): GLUCAP in the last 168 hours. Lipid Profile: No results for input(s): CHOL, HDL, LDLCALC, TRIG, CHOLHDL, LDLDIRECT in the last 72 hours. Thyroid Function Tests: No results for input(s): TSH, T4TOTAL, FREET4, T3FREE, THYROIDAB in the last 72 hours. Anemia Panel: No results for input(s): VITAMINB12, FOLATE, FERRITIN, TIBC, IRON, RETICCTPCT in the last 72 hours. Urine analysis:    Component Value Date/Time   COLORURINE YELLOW 11/06/2019 2151   APPEARANCEUR HAZY (A) 11/06/2019 2151   LABSPEC 1.019 11/06/2019 2151   PHURINE 6.0 11/06/2019 2151   GLUCOSEU NEGATIVE 11/06/2019 2151   HGBUR SMALL (A) 11/06/2019 2151   BILIRUBINUR NEGATIVE 11/06/2019 2151   BILIRUBINUR Negative 09/18/2019 1137    KETONESUR 20 (A) 11/06/2019 2151   PROTEINUR 100 (A) 11/06/2019 2151   UROBILINOGEN negative (A) 09/18/2019 1137   NITRITE NEGATIVE 11/06/2019 2151   LEUKOCYTESUR SMALL (A) 11/06/2019 2151   Sepsis Labs: @LABRCNTIP (procalcitonin:4,lacticidven:4)  ) Recent Results (from the past 240 hour(s))  SARS CORONAVIRUS 2 (TAT 6-24 HRS) Nasopharyngeal Nasopharyngeal Swab     Status: None   Collection Time: 11/28/19  9:51 AM   Specimen: Nasopharyngeal Swab  Result Value Ref Range Status   SARS Coronavirus 2 NEGATIVE NEGATIVE Final    Comment: (NOTE) SARS-CoV-2 target nucleic acids are NOT DETECTED. The SARS-CoV-2 RNA is generally detectable in upper and lower respiratory specimens during the acute phase of infection. Negative results do not preclude SARS-CoV-2 infection, do not rule out co-infections with other pathogens, and should not be used as the sole basis for treatment or other patient management decisions. Negative results must be combined with clinical observations, patient history, and epidemiological information. The expected result is Negative. Fact Sheet for Patients: SugarRoll.be Fact Sheet for Healthcare Providers: https://www.woods-mathews.com/ This test is not yet approved or cleared by the [place] FDA and  has been authorized for detection and/or diagnosis of SARS-CoV-2 by FDA under an Emergency Use Authorization (EUA). This EUA will remain  in effect (meaning this test can be used) for the duration of the COVID-19 declaration under Section 56 4(b)(1) of the Act, 21 U.S.C. section 360bbb-3(b)(1), unless the authorization is terminated or revoked sooner. Performed at [facility], [place] [address] [place], [place] [address]   SARS CORONAVIRUS 2 (TAT 6-24 HRS) Nasopharyngeal Nasopharyngeal Swab     Status: None   Collection Time: 11/29/19 12:20 PM   Specimen: Nasopharyngeal Swab  Result Value Ref Range Status   SARS  Coronavirus 2 NEGATIVE NEGATIVE Final    Comment: (NOTE) SARS-CoV-2 target nucleic acids are NOT DETECTED. The SARS-CoV-2 RNA is generally detectable in upper and lower respiratory specimens during the acute phase of infection. Negative results do not preclude SARS-CoV-2 infection, do not rule out co-infections with other pathogens, and should not be used as the sole basis for treatment or other patient management decisions. Negative results must be combined with clinical observations, patient history, and epidemiological information. The expected result is Negative. Fact Sheet for Patients: SugarRoll.be Fact Sheet for Healthcare Providers: https://www.woods-mathews.com/ This test is not yet approved or cleared by the [place]  States FDA and  has been authorized for detection and/or diagnosis of SARS-CoV-2 by FDA under an Emergency Use Authorization (EUA). This EUA will remain  in effect (meaning this test can be used) for the duration of the COVID-19 declaration under Section 56 4(b)(1) of the Act, 21 U.S.C. section 360bbb-3(b)(1), unless the authorization is terminated or revoked sooner. Performed at [facility], [place] [address], [place], [place] [address]       Studies: No results found.  Scheduled Meds: . carvedilol  3.125 mg Oral BID  . ciprofloxacin  500 mg Oral BID  . diclofenac Sodium  2 g Topical TID  . pantoprazole  40 mg Oral Daily  . pravastatin  20 mg Oral QPM    Continuous Infusions: . dextrose 5% lactated ringers 75 mL/hr at 12/02/19 2057  . heparin 850 Units/hr (12/03/19 0836)     LOS: 3 days     Kayleen Memos, MD [facility] Pager [phone]  If 7PM-7AM, please contact night-coverage [email or web address] Password [email or web address] 12/03/2019, 1:03 PM

## 2019-12-03 NOTE — Progress Notes (Signed)
Physical Therapy Treatment Patient Details Name: [name] MRN: [id number] DOB: 29-May-1961 Today's Date: 12/03/2019    History of Present Illness 59yo female c/o weakness, light-headedness, multiple falls, abdominal pain. CTH/MRI negative for acute changes, imaging negative for LE fracture; however abdominal CT does show duodenal inflammation and pancreatitis. PMH CAD, cardiomyopathy, NSTEMI, A-fib, CVA with R residual weakness, CABG, cardiac cath    PT Comments    Patient resting in bed on arrival of PT. She had been given Benedryl for rash, and felt a "little sleepy", but overall feeling good. Encouraged to sit in [facility] chair. Close supervision/Mid Guard for bed mobility. Sit<>stand with IV pole to steady self. Ambulated to and from door, with no LOB. Positioned in BS chair and performed all LE ex as outlined. Reviewed UE ex as well. Patient states she has light 1# free weights at home . Encouraged to perform ex twice daily , 10 reps each ex. Should benefit from PT to further address goals for optimal functional outcomes and reduce risk of falls.   Follow Up Recommendations  Outpatient PT;Supervision for mobility/OOB     Equipment Recommendations  None recommended by PT    Recommendations for Other Services       Precautions / Restrictions Precautions Precautions: Fall Precaution Comments: R hemiparesis Restrictions Weight Bearing Restrictions: No    Mobility  Bed Mobility Overal bed mobility: Modified Independent             General bed mobility comments: use of bed rails, increased time  Transfers Overall transfer level: Needs assistance Equipment used: None Transfers: Sit to/from Stand Sit to Stand: Min guard Stand pivot transfers: Min guard       General transfer comment: min guard for safety due to mild unsteadiness  Ambulation/Gait Ambulation/Gait assistance: Min assist Gait Distance (Feet): 23 Feet Assistive device: IV Pole Gait Pattern/deviations:  Step-through pattern;Decreased step length - left;Decreased stance time - right;Decreased dorsiflexion - right;Decreased weight shift to right;Trendelenburg;Drifts right/left     General Gait Details: Fairly good standing balance, steadied self with IV Forensic scientist    Modified Rankin (Stroke Patients Only)       Balance Overall balance assessment: Needs assistance Sitting-balance support: Feet supported Sitting balance-Leahy Scale: Good     Standing balance support: During functional activity;No upper extremity supported Standing balance-Leahy Scale: Fair Standing balance comment: MinA for balance without U UE support- usually uses quad cane                            Cognition Arousal/Alertness: Awake/alert Behavior During Therapy: WFL for tasks assessed/performed Overall Cognitive Status: Within Functional Limits for tasks assessed                                        Exercises General Exercises - Lower Extremity Ankle Circles/Pumps: AROM;Seated;10 reps Quad Sets: AROM;Seated;10 reps Gluteal Sets: AROM;Seated;10 reps Short Arc Quad: AROM;Seated;10 reps Hip Flexion/Marching: AROM;Seated;10 reps Toe Raises: PROM;Seated;10 reps Heel Raises: PROM;Seated;10 reps Shoulder Exercises Shoulder Flexion: AROM;Seated Shoulder ABduction: AROM;Seated Elbow Flexion: AROM;Seated Elbow Extension: AROM;Seated Other Exercises Other Exercises: Encouraged to perform ex 2-3 times daily- 10 reps each. She says she has very light 1# free weights at home as well as some theraputty    General Comments General  comments (skin integrity, edema, etc.): Monitor closely for skin and joint integrity, poor tone and turgor- very frail and prone to skin tears and bruising.      Pertinent Vitals/Pain Pain Score: 3  Faces Pain Scale: Hurts a little bit Pain Location: Right leg Pain Descriptors / Indicators: Aching    Home  Living                      Prior Function            PT Goals (current goals can now be found in the care plan section) Acute Rehab PT Goals Patient Stated Goal: go home, stop having issues with pancreas PT Goal Formulation: With patient Time For Goal Achievement: 12/14/19 Potential to Achieve Goals: Good Progress towards PT goals: Progressing toward goals    Frequency    Min 3X/week      PT Plan      Co-evaluation              AM-PAC PT "6 Clicks" Mobility   Outcome Measure  Help needed turning from your back to your side while in a flat bed without using bedrails?: None Help needed moving from lying on your back to sitting on the side of a flat bed without using bedrails?: A Little Help needed moving to and from a bed to a chair (including a wheelchair)?: A Little Help needed standing up from a chair using your arms (e.g., wheelchair or bedside chair)?: A Little Help needed to walk in hospital room?: A Little Help needed climbing 3-5 steps with a railing? : A Little 6 Click Score: 19    End of Session   Activity Tolerance: Patient tolerated treatment well Patient left: in chair;with call bell/phone within reach;with chair alarm set   PT Visit Diagnosis: Unsteadiness on feet (R26.81);Other abnormalities of gait and mobility (R26.89);Muscle weakness (generalized) (M62.81);Other symptoms and signs involving the nervous system (R29.898);Difficulty in walking, not elsewhere classified (R26.2)     Time: 0200-0236 PT Time Calculation (min) (ACUTE ONLY): 36 min  Charges:  $Gait Training: 8-22 mins $Therapeutic Exercise: 8-22 mins                    Rollen Sox, PT # [phone] CGV cell  Casandra Doffing 12/03/2019, 2:49 PM

## 2019-12-03 NOTE — Progress Notes (Signed)
Came to see the patient in her room this afternoon.  Noted wheals affecting bilateral buttocks and under R breast.   She states she has a history of urticaria.  This appears acute.  No signs of distress, eating her dinner.   Will treat as stated previously and continue to closely monitor.

## 2019-12-03 NOTE — Progress Notes (Signed)
Reported itching rash affecting buttocks not improved with po benadryl.  Unknown culprit.  IV benadryl, IV pepcid, and IV solumedrol ordered once.  Will continue po benadryl as needed if rash persists.

## 2019-12-04 LAB — BASIC METABOLIC PANEL
Anion gap: 8 (ref 5–15)
BUN: 9 mg/dL (ref 6–20)
CO2: 24 mmol/L (ref 22–32)
Calcium: 8.7 mg/dL — ABNORMAL LOW (ref 8.9–10.3)
Chloride: 106 mmol/L (ref 98–111)
Creatinine, Ser: 0.52 mg/dL (ref 0.44–1.00)
GFR calc Af Amer: >60 mL/min (ref >60)
GFR calc non Af Amer: >60 mL/min (ref >60)
Glucose, Bld: 149 mg/dL — ABNORMAL HIGH (ref 70–99)
Potassium: 3.4 mmol/L — ABNORMAL LOW (ref 3.5–5.1)
Sodium: 138 mmol/L (ref 135–145)

## 2019-12-04 LAB — MAGNESIUM: Magnesium: 1.8 mg/dL (ref 1.7–2.4)

## 2019-12-04 LAB — CBC
HCT: 41.9 % (ref 36.0–46.0)
Hemoglobin: 13.6 g/dL (ref 12.0–15.0)
MCH: 31.9 pg (ref 26.0–34.0)
MCHC: 32.5 g/dL (ref 30.0–36.0)
MCV: 98.4 fL (ref 80.0–100.0)
Platelets: 309 10*3/uL (ref 150–400)
RBC: 4.26 MIL/uL (ref 3.87–5.11)
RDW: 13.7 % (ref 11.5–15.5)
WBC: 5 10*3/uL (ref 4.0–10.5)
nRBC: 0 % (ref 0.0–0.2)

## 2019-12-04 LAB — LIPASE, BLOOD: Lipase: 47 U/L (ref 11–51)

## 2019-12-04 LAB — HEPARIN LEVEL (UNFRACTIONATED): Heparin Unfractionated: 0.71 IU/mL — ABNORMAL HIGH (ref 0.30–0.70)

## 2019-12-04 LAB — PHOSPHORUS: Phosphorus: 3.9 mg/dL (ref 2.5–4.6)

## 2019-12-04 MED ORDER — CIPROFLOXACIN HCL 500 MG PO TABS: 500.0000 mg | ORAL_TABLET | Freq: Two times a day (BID) | ORAL | 0 refills | Status: DC

## 2019-12-04 MED ORDER — VITAMIN B-12 100 MCG PO TABS
100.0000 ug | ORAL_TABLET | Freq: Every day | ORAL | Status: DC
  Administered 2019-12-04: 100 ug via ORAL
  Filled 2019-12-04: qty 1

## 2019-12-04 MED ORDER — PANTOPRAZOLE SODIUM 40 MG PO TBEC: 40.0000 mg | DELAYED_RELEASE_TABLET | Freq: Every day | ORAL | 3 refills | Status: DC

## 2019-12-04 MED ORDER — TRIAMCINOLONE ACETONIDE 0.1 % EX CREA
1.0000 "application " | TOPICAL_CREAM | Freq: Two times a day (BID) | CUTANEOUS | Status: DC | PRN
  Filled 2019-12-04: qty 15

## 2019-12-04 MED ORDER — MUPIROCIN CALCIUM 2 % EX CREA
1.0000 "application " | TOPICAL_CREAM | Freq: Two times a day (BID) | CUTANEOUS | Status: DC | PRN
  Filled 2019-12-04: qty 15

## 2019-12-04 MED ORDER — CALCIUM CARBONATE ANTACID 500 MG PO CHEW
1.0000 | CHEWABLE_TABLET | Freq: Two times a day (BID) | ORAL | Status: DC
  Administered 2019-12-04: 200 mg via ORAL
  Filled 2019-12-04: qty 1

## 2019-12-04 MED ORDER — VITAMIN D 25 MCG (1000 UNIT) PO TABS
1000.0000 [IU] | ORAL_TABLET | Freq: Every day | ORAL | Status: DC
  Administered 2019-12-04: 1000 [IU] via ORAL
  Filled 2019-12-04: qty 1

## 2019-12-04 MED ORDER — ONDANSETRON 4 MG PO TBDP: 4.0000 mg | ORAL_TABLET | Freq: Three times a day (TID) | ORAL | 0 refills | Status: DC | PRN

## 2019-12-04 MED ORDER — LORATADINE 10 MG PO TABS: 10.0000 mg | ORAL_TABLET | Freq: Every evening | ORAL | Status: DC

## 2019-12-04 MED ORDER — RIVAROXABAN 20 MG PO TABS
20.0000 mg | ORAL_TABLET | Freq: Every day | ORAL | Status: DC
  Administered 2019-12-04: 20 mg via ORAL
  Filled 2019-12-04: qty 1

## 2019-12-04 NOTE — Progress Notes (Signed)
ANTICOAGULATION CONSULT NOTE - Follow Up Consult  Pharmacy Consult for Heparin Indication: Afib w/ prior CVA  Allergies  Allergen Reactions  . Lopressor [Metoprolol Tartrate] Swelling    Angioedema  . Allegra Allergy [Fexofenadine Hcl] Hives  . Dilaudid [Hydromorphone]     Hallucinations  . Nsaids     On Xarelto  . Gabapentin (Once-Daily) Rash  . Lipitor [Atorvastatin] Diarrhea    Patient Measurements: Height: 5' (152.4 cm) Weight: 117 lb (53.1 kg) IBW/kg (Calculated) : 45.5 Heparin Dosing Weight:   Vital Signs: Temp: 98.4 F (36.9 C) (03/09 2010) BP: 104/70 (03/09 2010) Pulse Rate: 76 (03/09 2010)  Labs: Recent Labs     0000 12/01/19 0557 12/01/19 0557 12/02/19 0601 12/02/19 0610 12/02/19 2028 12/02/19 2028 12/03/19 0620 12/03/19 1646 12/03/19 2334  HGB   < > 13.2   < > 13.9  --   --   --  13.4 13.8  --   HCT  --  41.4   < > 42.8  --   --   --  42.1 42.9  --   PLT  --  313  --  317  --   --   --  302  --   --   APTT  --   --   --   --   --  36  --   --   --   --   HEPARINUNFRC  --   --   --   --   --  0.11*   < > 0.25* 0.62 0.61  CREATININE  --  0.44  --   --  0.40*  --   --  0.55  --   --    < > = values in this interval not displayed.    Estimated Creatinine Clearance: 54.4 mL/min (by C-G formula based on SCr of 0.55 mg/dL).   Medications:  Infusions:  . heparin 850 Units/hr (12/04/19 0529)    Assessment: Patient with heparin level at goal.   No heparin issues noted.  Goal of Therapy:  Heparin level 0.3-0.7 units/ml Monitor platelets by anticoagulation protocol: Yes   Plan:  Continue heparin drip at current rate Recheck level with AM labs  Tyler Deis, Shea Stakes [place] 12/04/2019,5:49 AM

## 2019-12-04 NOTE — TOC Transition Note (Signed)
Transition of Care [facility]) - CM/SW Discharge Note   Patient Details  Name: [name] MRN: [id number] Date of Birth: 1961-08-20  Transition of Care [facility]) CM/SW Contact:  Trish Mage, LCSW Phone Number: 12/04/2019, 12:36 PM   Clinical Narrative:   Spoke with patient, husband at bedside, about Outpatient Rehabilitation.  She expressed interest in follow up.  Messaged Dr to ask her to place order for same.  [facility] will contact patient with appointment after reviewing paperwork. TOC sign off.      Barriers to Discharge: No Barriers Identified   Patient Goals and CMS Choice        Discharge Placement                       Discharge Plan and Services                                     Social Determinants of Health (SDOH) Interventions     Readmission Risk Interventions No flowsheet data found.

## 2019-12-04 NOTE — Discharge Summary (Signed)
Physician Discharge Summary   Patient ID: [name] MRN: [id number] DOB/AGE: [birth year] 59 y.o.  Admit date: 11/29/2019 Discharge date: 12/04/2019  Primary Care Physician:  [name], DO   Recommendations for Outpatient Follow-up:  1. Follow up with PCP in 1-2 weeks  Home Health: None  Equipment/Devices:   Discharge Condition: stable CODE STATUS: FULL  Diet recommendation: Healthy diet   Discharge Diagnoses:   . Acute recurrent pancreatitis . Pseudocyst 2.1 cm . Duodenitis with mild chronic gastritis . Benign essential HTN . Cerebrovascular accident (CVA) due to occlusion of left middle cerebral artery ([place]) . Chronic diastolic CHF (congestive heart failure) ([place]) . Coronary artery disease . Paroxysmal atrial fibrillation (HCC) Near syncope, fall with dizziness Acute urticaria Hypophosphatemia  Consults: Gastroenterology    Allergies:   Allergies  Allergen Reactions  . Lopressor [Metoprolol Tartrate] Swelling    Angioedema  . Allegra Allergy [Fexofenadine Hcl] Hives  . Dilaudid [Hydromorphone]     Hallucinations  . Nsaids     On Xarelto  . Gabapentin (Once-Daily) Rash  . Lipitor [Atorvastatin] Diarrhea     DISCHARGE MEDICATIONS: Allergies as of 12/04/2019      Reactions   Lopressor [metoprolol Tartrate] Swelling   Angioedema   Allegra Allergy [fexofenadine Hcl] Hives   Dilaudid [hydromorphone]    Hallucinations   Nsaids    On Xarelto   Gabapentin (once-daily) Rash   Lipitor [atorvastatin] Diarrhea      Medication List    STOP taking these medications   famotidine 20 MG tablet Commonly known as: PEPCID   triamcinolone cream 0.1 % Commonly known as: KENALOG     TAKE these medications   acetaminophen 325 MG tablet Commonly known as: TYLENOL Take 2 tablets (650 mg total) by mouth every 6 (six) hours as needed for mild pain or moderate pain (or Fever >/= 101).   azelastine 0.1 % nasal spray Commonly known as: ASTELIN Place 1-2  sprays into both nostrils 2 (two) times daily as needed for rhinitis. Use in each nostril as directed   baclofen 10 MG tablet Commonly known as: LIORESAL Take 10 mg by mouth in the morning and at bedtime. Notes to patient: 3/10   calcium carbonate 500 MG chewable tablet Commonly known as: TUMS - dosed in mg elemental calcium Chew 1 tablet by mouth 2 (two) times daily. Notes to patient: 3/10   carvedilol 3.125 MG tablet Commonly known as: COREG Take 1 tablet by mouth twice daily What changed: when to take this Notes to patient: 3/10   cholecalciferol 1000 units tablet Commonly known as: VITAMIN D Take 1,000 Units by mouth daily. Notes to patient: 3/11   ciprofloxacin 500 MG tablet Commonly known as: CIPRO Take 1 tablet (500 mg total) by mouth 2 (two) times daily for 2 days. Notes to patient: 3/10   ferrous sulfate 325 (65 FE) MG tablet Take 325 mg by mouth 2 (two) times a week. Mondays  and Thursdays. Notes to patient: 3/11   hydrOXYzine 10 MG tablet Commonly known as: ATARAX/VISTARIL Take 1 tablet (10 mg total) by mouth at bedtime. What changed:   when to take this  reasons to take this Notes to patient: 3/10   levocetirizine 5 MG tablet Commonly known as: XYZAL Take 1 tablet (5 mg total) by mouth every evening. Notes to patient: 3/10   mupirocin cream 2 % Commonly known as: Bactroban Apply 1 application topically 2 (two) times daily. Apply to area on buttocks. What changed:   when  to take this  reasons to take this  additional instructions Notes to patient: 3/10   ondansetron 4 MG disintegrating tablet Commonly known as: Zofran ODT Take 1 tablet (4 mg total) by mouth every 8 (eight) hours as needed for nausea or vomiting.   pantoprazole 40 MG tablet Commonly known as: PROTONIX Take 1 tablet (40 mg total) by mouth daily. Start taking on: December 05, 2019 Notes to patient: 3/11   pravastatin 20 MG tablet Commonly known as: Pravachol Take 1 tablet  (20 mg total) by mouth daily. What changed: when to take this Notes to patient: 3/10   rivaroxaban 20 MG Tabs tablet Commonly known as: Xarelto Take 1 tablet daily with evening meal Notes to patient: 3/11   traMADol 50 MG tablet Commonly known as: ULTRAM Take 2 tablets (100 mg total) by mouth every 6 (six) hours as needed for moderate pain. What changed:   how much to take  when to take this   vitamin B-12 100 MCG tablet Commonly known as: CYANOCOBALAMIN Take 100 mcg by mouth daily. Notes to patient: 3/11        Brief H and P: For complete details please refer to admission H and P, but in brief *VelvetGriffinis a59 year old female with a history of CVA, CAD/two-vessel CABG, CHF, atrial fibrillation (on Xarelto),with recent hospitalization with acute pancreatitis, with no clear etiology, she presents to ED with multiple complaints, including mainly generalized weakness, lightheadedness and fall hitting her R hip no acute fx per xray.   Also reports abdominal pain, poor appetite, nausea with no vomiting.  Her symptoms worsened over last 24 hours.   EDwork-up significant for lipase of 329, CT showed improvement in pancreatic head inflammation, but evidence of new inflammation in the first and second portion of the duodenum concerning for duodenitis.  Patient was admitted for further work-up    Hospital Course:   Fall with the dizziness, near syncope -Patient presented with generalized weakness, lightheadedness, falling and hitting her right hip.  No fractures. -Positive orthostatic vital signs on admission, patient was placed on IV fluid hydration, fall precautions -PT OT recommended outpatient PT.  MRI of the brain negative for acute intracranial findings   Recurrent pancreatitis with pseudocyst measuring 2.1 cm, duodenitis Patient presented with elevated lipase, 556 on admission, now trended down to 80 on discharge Gastroenterology was consulted.  Stable pancreatic head  pseudocyst. -Patient underwent endoscopic ultrasound on 3/8 by [name] Landmark -Recommended to continue ciprofloxacin for 72 hours -Biopsy from EUS on 3/8 showed peptic duodenitis, no dysplasia or malignancy.  Stomach biopsy showed mild chronic gastritis with intestinal metaplasia.  No malignancy.  No evidence of bleeding Patient recommended to continue PPI and follow-up outpatient with GI  Duodenitis, mild chronic gastritis with intestinal metaplasia No H. Pylori. Outpatient follow-up with GI, continue PPI  Acute urticaria Resolved, on 3/9, patient had urticaria and wheals affecting bilateral buttocks and on the right breast, patient reported history of urticaria She was treated with IV Benadryl, Pepcid and IV Solu-Medrol Unclear etiology.  Hypophosphatemia Repleted with sodium phosphate 30 mL months, encouraged increased p.o. diet   History of CVA -MRI negative for any acute intracranial findings -Continue with statin.  Paroxysmal A. Fib Rate controlled on Coreg Patient was placed on heparin drip.  Once EUS was complete, resumed Xarelto today 3/10 H&H stable [id number]  Chronic diastolic CHF Last 2D echo: Normal LVEF 50 to 55% with grade 1 diastolic dysfunction Currently compensated and euvolemic  Hypertension Continue low-dose Coreg, BP stable  Hyperlipidemia -Continue with statin  Ambulatory dysfunction post fall. From her right hip, x-ray showed no evidence of fracture PT OT assessed and recommended outpatient PT   Aortic aneurysm, incidentally found Measures 3.1 cm Follow up outpatient     Severe protein-calorie malnutrition (HCC) Encouraged p.o. diet, nutritional supplements   Day of Discharge S: No complaints, eating breakfast, hoping to go home today  BP 116/66 (BP Location: Left Arm)   Pulse 71   Temp 97.7 F (36.5 C) (Oral)   Resp 18   Ht 5' (1.524 m)   Wt 53.1 kg   LMP  (LMP Unknown) Comment: at age 60  SpO2 95%   BMI 22.85 kg/m    Physical Exam: General: Alert and awake oriented x3 not in any acute distress. HEENT: anicteric sclera, pupils reactive to light and accommodation CVS: S1-S2 clear no murmur rubs or gallops Chest: clear to auscultation bilaterally, no wheezing rales or rhonchi Abdomen: soft nontender, nondistended, normal bowel sounds Extremities: no cyanosis, clubbing or edema noted bilaterally Neuro: Cranial nerves II-XII intact, no focal neurological deficits    Get Medicines reviewed and adjusted: Please take all your medications with you for your next visit with your Primary MD  Please request your Primary MD to go over all hospital tests and procedure/radiological results at the follow up. Please ask your Primary MD to get all Hospital records sent to his/her office.  If you experience worsening of your admission symptoms, develop shortness of breath, life threatening emergency, suicidal or homicidal thoughts you must seek medical attention immediately by calling 911 or calling your MD immediately  if symptoms less severe.  You must read complete instructions/literature along with all the possible adverse reactions/side effects for all the Medicines you take and that have been prescribed to you. Take any new Medicines after you have completely understood and accept all the possible adverse reactions/side effects.   Do not drive when taking pain medications.   Do not take more than prescribed Pain, Sleep and Anxiety Medications  Special Instructions: If you have smoked or chewed Tobacco  in the last 2 yrs please stop smoking, stop any regular Alcohol  and or any Recreational drug use.  Wear Seat belts while driving.  Please note  You were cared for by a hospitalist during your hospital stay. Once you are discharged, your primary care physician will handle any further medical issues. Please note that NO REFILLS for any discharge medications will be authorized once you are discharged, as it is  imperative that you return to your primary care physician (or establish a relationship with a primary care physician if you do not have one) for your aftercare needs so that they can reassess your need for medications and monitor your lab values.   The results of significant diagnostics from this hospitalization (including imaging, microbiology, ancillary and laboratory) are listed below for reference.      Procedures/Studies:  CT Head Wo Contrast  Result Date: 11/29/2019 CLINICAL DATA:  Right-sided weakness.  Fall. EXAM: CT HEAD WITHOUT CONTRAST TECHNIQUE: Contiguous axial images were obtained from the base of the skull through the vertex without intravenous contrast. COMPARISON:  09/13/2019 FINDINGS: Brain: Evidence of old infarct in the left insular region, left parietal lobe and left temporal lobe. This is stable. No acute infarct or hemorrhage. No hydrocephalus. Vascular: No hyperdense vessel or unexpected calcification. Skull: No acute calvarial abnormality. Sinuses/Orbits: Visualized paranasal sinuses and mastoids clear. Orbital soft tissues unremarkable. Other: None IMPRESSION: Evidence of old left  MCA infarct, stable. No acute intracranial abnormality. Electronically Signed   By: Rolm Baptise M.D.   On: 11/29/2019 10:24   MR BRAIN WO CONTRAST  Result Date: 11/29/2019 CLINICAL DATA:  Worsening right-sided weakness EXAM: MRI HEAD WITHOUT CONTRAST TECHNIQUE: Multiplanar, multiecho pulse sequences of the brain and surrounding structures were obtained without intravenous contrast. COMPARISON:  2018 FINDINGS: Brain: There is no acute infarction or intracranial hemorrhage. There is no intracranial mass, mass effect, or edema. There is no hydrocephalus or extra-axial fluid collection. There are small chronic cortical/subcortical infarcts involving the left frontal and parietal lobes. There is a chronic infarct involving the left basal ganglia and corona radiata with extension into the temporal lobe.  Minor chronic blood products associated with some of these infarcts. Additional patchy foci of T2 hyperintensity in the supratentorial white matter are nonspecific but likely reflect mild chronic microvascular ischemic changes. There is ex vacuo dilatation of the left lateral ventricle. Vascular: Major vessel flow voids at the skull base are preserved. Skull and upper cervical spine: Normal marrow signal is preserved. Sinuses/Orbits: Paranasal sinuses are aerated. Orbits are unremarkable. Other: Sella is unremarkable. Mastoid air cells are clear. IMPRESSION: No acute infarction. Chronic infarcts as described above. Mild chronic microvascular ischemic changes. Electronically Signed   By: Macy Mis M.D.   On: 11/29/2019 13:13   CT Abdomen Pelvis W Contrast  Result Date: 11/29/2019 CLINICAL DATA:  Abdominal pain, nausea. EXAM: CT ABDOMEN AND PELVIS WITH CONTRAST TECHNIQUE: Multidetector CT imaging of the abdomen and pelvis was performed using the standard protocol following bolus administration of intravenous contrast. CONTRAST:  153mL OMNIPAQUE IOHEXOL 300 MG/ML  SOLN COMPARISON:  11/06/2019 FINDINGS: Lower chest: Linear bibasilar scarring. Heart is mildly enlarged. No effusions. Hepatobiliary: No focal hepatic abnormality. Gallbladder unremarkable. Pancreas: Previously seen stranding around the pancreatic head and uncinate process have improved. Cystic mass within the pancreatic head measures 2.1 cm, slightly enlarged since prior study when this measured 1.8 cm. No ductal dilatation. Spleen: No focal abnormality.  Normal size. Adrenals/Urinary Tract: Bilateral nephrolithiasis. No hydronephrosis. Small nodule in the left adrenal gland is stable. 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 Small bilateral hypodensities in the kidneys, likely cysts, stable, too small to characterize. Proximal right ureteral stone noted, 6 mm. This is stable when  compared to prior study. No associated hydronephrosis. Urinary bladder is unremarkable. Stomach/Bowel: Scattered left colonic diverticulosis. No active diverticulitis. Normal appendix. Stomach is decompressed. There is wall thickening involving the 1st and 2nd portions of the duodenum with surrounding inflammation/stranding. This is concerning for duodenitis. Remainder of small bowel is decompressed and unremarkable. Vascular/Lymphatic: Heavily calcified aorta. 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 Ulcerative plaque seen in several areas, stable. Reproductive: Uterus and adnexa unremarkable.  No mass. Other: No free fluid or free air. Musculoskeletal: No acute bony abnormality. IMPRESSION: The previously seen inflammation/stranding around the pancreatic head and uncinate process has decreased. Inflammation in the area now appears to be centered around the 1st and 2nd portions of the duodenum with associated duodenal wall thickening. Findings concerning for duodenitis. Essentially stable pancreatic head pseudocyst. Left colonic diverticulosis. No active diverticulitis. Irregular aortic atherosclerosis with altered plaque and aneurysmal dilatation, 3.1 cm maximally. Electronically Signed   By: Rolm Baptise M.D.   On: 11/29/2019 10:30   CT ABDOMEN PELVIS W CONTRAST  Addendum Date: 11/07/2019   ADDENDUM REPORT: 11/07/2019 00:39 ADDENDUM: Correction: Results were discussed with  Dr. Betsey Holiday, not Dr. Roderic Palau Electronically Signed   By: Lovena Le M.D.   On: 11/07/2019 00:39   Result Date: 11/07/2019 CLINICAL DATA:  Acute generalized abdominal pain with neutropenia, history of pancreatitis EXAM: CT ABDOMEN AND PELVIS WITH CONTRAST TECHNIQUE: Multidetector CT imaging of the abdomen and pelvis was performed using the standard protocol following bolus administration of intravenous contrast. CONTRAST:  7mL OMNIPAQUE IOHEXOL 300 MG/ML  SOLN COMPARISON:  CT 10/11/2019 FINDINGS:  Lower chest: Atelectatic changes present in the lung bases. No consolidation or effusion. Normal heart size. No pericardial effusion. Atherosclerotic calcification of the coronary arteries. Hepatobiliary: No focal liver abnormality is seen. Mild dilatation and thickening of the proximal cystic duct to the level 2 small calculi proximal to the junction with the common bile duct (2/23). No other gross biliary ductal dilatation is seen. Pancreas: There is diffuse peripancreatic fat stranding most prominent about the head and uncinate process, to a lesser extent the pancreatic body which is overall increased from comparison study. Redemonstration of a hypoattenuating cystic structure in the pancreatic head which measures 1.7 by 1.8 cm, not significantly changed from comparison study. Slight prominence of the pancreatic duct seen on comparison exam is diminished on today's study. No gross ductal dilatation is seen. Remaining portions of the pancreatic parenchyma enhances normally without evidence of pancreatic necrosis. Inflammation appears predominantly phlegmonous without organized peripancreatic fluid collection. Spleen: Normal in size without focal abnormality. Adrenals/Urinary Tract: Nodular thickening of the left adrenal gland is similar to prior. Right adrenal gland is normal. Scattered subcentimeter hypoattenuating foci throughout both kidneys too small to fully characterize on CT imaging but statistically likely benign. 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 Bilateral nonobstructive nephrolithiasis. No ureteral calculi are seen. Urinary bladder is largely decompressed at the time of exam and therefore poorly evaluated by CT imaging. Small cystocele. Stomach/Bowel: Persistent pre-pyloric hyperenhancement and mural thickening is likely secondary to the pancreatic inflammation but may result in delayed passage of ingested material  given the degree of stomach distention. No small bowel dilatation or wall thickening. A normal appendix is visualized. No colonic dilatation or wall thickening. Scattered colonic diverticula without focal pericolonic inflammation to suggest diverticulitis. Vascular/Lymphatic: Extensive atherosclerotic calcification of the abdominal aorta and branch vessels. There is redemonstration of a infrarenal abdominal aortic aneurysm measuring up to 3.4 cm in size with irregular mural thrombus and ulcerated plaque (2/38 and 4/63). There is some adjacent stranding in the retroperitoneum arising from the pancreas and about the duodenum but without focal periaortic stranding identified. Reactive adenopathy again seen in the upper abdomen. Reproductive: Venous engorgement the parametrial and gonadal veins. Retroverted uterus. No concerning adnexal lesions. Other: Peripancreatic phlegmon and edema, increased from prior. No free intraperitoneal fluid or air. No organized collections or abscess. Pelvic floor laxity, similar to prior with associated cyst is seal. No bowel containing hernias. Musculoskeletal: Multilevel degenerative changes are present in the imaged portions of the spine. No acute osseous abnormality or suspicious osseous lesion. IMPRESSION: 1. Interval worsening of peripancreatic phlegmon and edema. No organized peripancreatic fluid collection or evidence of pancreatic necrosis. Stable cystic lesion in the head of the pancreas, favoring pseudocyst. 2. Persistent choledocholithiasis with calculi in the proximal cystic duct. Increasing cystic ductal dilatation mural enhancement and inflammation worrisome for developing cholangitis. 3. Persistent pre-pyloric hyperenhancement and mural thickening of the stomach is likely secondary to the pancreatic inflammation but may result in delayed passage of ingested  material given the degree of stomach distention. 4. Bilateral nonobstructive nephrolithiasis. 5. Infrarenal abdominal  aortic aneurysm measuring up to 3.4 cm in size with irregular mural thrombus and ulcerated plaque. Recommend followup by ultrasound in 3 years. This recommendation follows ACR consensus guidelines: White Paper of the ACR Incidental Findings Committee II on Vascular Findings. J Am Coll Radiol 2013; 10:789-794. Aortic aneurysm NOS (ICD10-I71.9) 6. Aortic Atherosclerosis (ICD10-I70.0). These results were called by telephone at the time of interpretation on 11/07/2019 at 12:32 am to provider [facility] ZAMMIT , who verbally acknowledged these results. Electronically Signed: By: Lovena Le M.D. On: 11/07/2019 00:33   MR 3D Recon At Scanner  Result Date: 11/07/2019 CLINICAL DATA:  Acute pancreatitis. EXAM: MRI ABDOMEN WITHOUT AND WITH CONTRAST (INCLUDING MRCP) TECHNIQUE: Multiplanar multisequence MR imaging of the abdomen was performed both before and after the administration of intravenous contrast. Heavily T2-weighted images of the biliary and pancreatic ducts were obtained, and three-dimensional MRCP images were rendered by post processing. CONTRAST:  44mL GADAVIST GADOBUTROL 1 MMOL/ML IV SOLN COMPARISON:  11/07/2019 FINDINGS: Lower chest: Atelectasis identified within the lung bases. Hepatobiliary: Hepatic steatosis. No focal liver abnormality. Mild gallbladder distention. No gallbladder wall inflammation noted. No gallstones identified. The previously noted calcified stones within the cystic duct are not confidently identified on today's exam although they may be obscured by motion artifact. No significant intrahepatic bile duct dilatation identified. The common bile duct appears focally ectatic proximal measuring up to 8 mm. No stones identified within the common bile duct. Pancreas: No main duct dilatation identified. Pancreatic edema with surrounding inflammation and free fluid is again noted compatible with acute pancreatitis. Within the head of pancreas there is again seen a 1.8 x 1.3 cm heterogeneous, mixed  signal intensity structure favoring pseudo cyst, image 24/4. Previously this measured 1.8 x 1.9 cm. There are no mature fluid collections identified within the upper abdomen to suggest additional pseudocysts. Spleen:  Within normal limits in size and appearance. Adrenals/Urinary Tract: Normal appearance of the adrenal glands. No hydronephrosis. Multiple bilateral too small to characterize T2 hyperintense kidney lesions are again noted. Stomach/Bowel: Persistent distension of the gastric lumen. Pre-pyloric hyperenhancement and mural thickening is similar to previous exam and is felt to be likely secondary to pancreatic inflammation. No abnormal bowel wall thickening, inflammation or distension noted. Vascular/Lymphatic: Aortic atherosclerosis. Infrarenal abdominal aortic aneurysm is again identified, better characterized on CT from 11/06/2019. No adenopathy. Other: Peripancreatic phlegmon and edema is identified. This appears similar to 11/06/2019. No extra pancreatic organized fluid collections identified. Musculoskeletal: No suspicious bone lesions identified. IMPRESSION: 1. Redemonstration of acute pancreatitis. No organized peripancreatic fluid collections are identified. 2. The previously noted calcified stones within the cystic duct are not confidently identified on today's exam although they may be obscured by motion artifact. No significant intrahepatic or common bile duct dilatation identified. No stones identified within the common bile duct. 3. Persistent distension of the gastric lumen with pre-pyloric hyperenhancement and mural thickened mural thickeningfavored to be secondary to pancreatic inflammation. 4. Infrarenal abdominal aortic aneurysm, better characterized on recent CT from 02/10/201. Electronically Signed   By: Kerby Moors M.D.   On: 11/07/2019 15:19   MR ABDOMEN MRCP W WO CONTAST  Result Date: 11/07/2019 CLINICAL DATA:  Acute pancreatitis. EXAM: MRI ABDOMEN WITHOUT AND WITH CONTRAST  (INCLUDING MRCP) TECHNIQUE: Multiplanar multisequence MR imaging of the abdomen was performed both before and after the administration of intravenous contrast. Heavily T2-weighted images of the biliary and pancreatic ducts were obtained, and  three-dimensional MRCP images were rendered by post processing. CONTRAST:  42mL GADAVIST GADOBUTROL 1 MMOL/ML IV SOLN COMPARISON:  11/07/2019 FINDINGS: Lower chest: Atelectasis identified within the lung bases. Hepatobiliary: Hepatic steatosis. No focal liver abnormality. Mild gallbladder distention. No gallbladder wall inflammation noted. No gallstones identified. The previously noted calcified stones within the cystic duct are not confidently identified on today's exam although they may be obscured by motion artifact. No significant intrahepatic bile duct dilatation identified. The common bile duct appears focally ectatic proximal measuring up to 8 mm. No stones identified within the common bile duct. Pancreas: No main duct dilatation identified. Pancreatic edema with surrounding inflammation and free fluid is again noted compatible with acute pancreatitis. Within the head of pancreas there is again seen a 1.8 x 1.3 cm heterogeneous, mixed signal intensity structure favoring pseudo cyst, image 24/4. Previously this measured 1.8 x 1.9 cm. There are no mature fluid collections identified within the upper abdomen to suggest additional pseudocysts. Spleen:  Within normal limits in size and appearance. Adrenals/Urinary Tract: Normal appearance of the adrenal glands. No hydronephrosis. Multiple bilateral too small to characterize T2 hyperintense kidney lesions are again noted. Stomach/Bowel: Persistent distension of the gastric lumen. Pre-pyloric hyperenhancement and mural thickening is similar to previous exam and is felt to be likely secondary to pancreatic inflammation. No abnormal bowel wall thickening, inflammation or distension noted. Vascular/Lymphatic: Aortic atherosclerosis.  Infrarenal abdominal aortic aneurysm is again identified, better characterized on CT from 11/06/2019. No adenopathy. Other: Peripancreatic phlegmon and edema is identified. This appears similar to 11/06/2019. No extra pancreatic organized fluid collections identified. Musculoskeletal: No suspicious bone lesions identified. IMPRESSION: 1. Redemonstration of acute pancreatitis. No organized peripancreatic fluid collections are identified. 2. The previously noted calcified stones within the cystic duct are not confidently identified on today's exam although they may be obscured by motion artifact. No significant intrahepatic or common bile duct dilatation identified. No stones identified within the common bile duct. 3. Persistent distension of the gastric lumen with pre-pyloric hyperenhancement and mural thickened mural thickeningfavored to be secondary to pancreatic inflammation. 4. Infrarenal abdominal aortic aneurysm, better characterized on recent CT from 02/10/201. Electronically Signed   By: Kerby Moors M.D.   On: 11/07/2019 15:19   CUP PACEART REMOTE DEVICE CHECK  Result Date: 11/21/2019 [facility] summary report received. Battery status OK. Normal device function. No new symptom episodes, tachy episodes, brady, or pause episodes. No new AF episodes. Monthly summary reports and ROV/PRN Kathy Breach, RN, CCDS, [employer]  DG Hip Unilat With Pelvis 2-3 Views Right  Result Date: 11/29/2019 CLINICAL DATA:  Right hip pain secondary to a fall yesterday. EXAM: DG HIP (WITH OR WITHOUT PELVIS) 2-3V RIGHT COMPARISON:  CT scan of the abdomen and pelvis dated 11/07/2019 FINDINGS: There is no evidence of hip fracture or dislocation. Minimal osteophyte formation on the superolateral aspect of the right acetabulum. IMPRESSION: No acute abnormality. Electronically Signed   By: Lorriane Shire M.D.   On: 11/29/2019 12:35   US Abdomen Limited RUQ  Result Date: 11/07/2019 CLINICAL DATA:  Pancreatitis.  Cystic  pancreatic mass on CT. EXAM: ULTRASOUND ABDOMEN LIMITED RIGHT UPPER QUADRANT COMPARISON:  CT, 11/06/2019 FINDINGS: Gallbladder: No gallstones or wall thickening visualized. No sonographic Murphy sign noted by sonographer. Common bile duct: Diameter: 5 mm Liver: Normal echogenicity. Normal size. No mass or focal lesion. Portal vein is patent on color Doppler imaging with normal direction of blood flow towards the liver. Other: In the pancreatic head, there is a hypoechoic  area measuring 2.9 x 2.6 x 3.0 cm, which corresponds to the cystic appearing mass noted on the previous day's CT. IMPRESSION: 1. No gallstones or bile duct dilation. No sonographic liver abnormality. 2. Hypoechoic pancreatic head lesion consistent with the cystic mass noted on the previous day's CT. Electronically Signed   By: Lajean Manes M.D.   On: 11/07/2019 10:06       LAB RESULTS: Basic Metabolic Panel: Recent Labs  Lab 12/02/19 0610 12/03/19 0620 12/04/19 0642  NA  --  141 138  K  --  3.6 3.4*  CL  --  108 106  CO2  --  26 24  GLUCOSE  --  114* 149*  BUN  --  5* 9  CREATININE  --  0.55 0.52  CALCIUM  --  8.9 8.7*  MG   < >  --  1.8  PHOS   < >  --  3.9   < > = values in this interval not displayed.   Liver Function Tests: Recent Labs  Lab 12/02/19 0610 12/03/19 0620  AST 15 21  ALT 10 13  ALKPHOS 46 47  BILITOT 0.3 0.6  PROT 6.0* 5.7*  ALBUMIN 3.3* 3.1*   Recent Labs  Lab 12/03/19 0620 12/04/19 0642  LIPASE 80* 47   No results for input(s): AMMONIA in the last 168 hours. CBC: Recent Labs  Lab 11/29/19 0909 11/30/19 0552 12/03/19 0620 12/03/19 0620 12/03/19 1646 12/04/19 0642  WBC 10.7*   < > 7.6  --   --  5.0  NEUTROABS 8.3*  --   --   --   --   --   HGB 15.5*   < > 13.4   < > 13.8 13.6  HCT 47.7*   < > 42.1   < > 42.9 41.9  MCV 99.0   < > 101.7*   < >  --  98.4  PLT 354   < > 302  --   --  309   < > = values in this interval not displayed.   Cardiac Enzymes: No results for  input(s): CKTOTAL, CKMB, CKMBINDEX, TROPONINI in the last 168 hours. BNP: Invalid input(s): POCBNP CBG: No results for input(s): GLUCAP in the last 168 hours.     Disposition and Follow-up: Discharge Instructions    Diet - low sodium heart healthy   Complete by: As directed    Increase activity slowly   Complete by: As directed        DISPOSITION: *Home Home   DISCHARGE FOLLOW-UP Follow-up Information    [name], [name], DO. Schedule an appointment as soon as possible for a visit in 2 week(s).   Specialty: Family Medicine Contact information: [id number] [place] [address] [phone]        Satira Sark, MD .   Specialty: Cardiology Contact information: [place] [address] [phone]        Lavena Bullion, DO. Schedule an appointment as soon as possible for a visit on 01/09/2020.   Specialty: Gastroenterology Why: 9:00 am Contact information: [place]  [id number] [phone]            Time coordinating discharge:  35 minutes  Signed:   Estill Cotta M.D. [facility] 12/04/2019, 1:15 PM

## 2019-12-04 NOTE — Progress Notes (Signed)
Discussed with patient discharge instructions, she verbalized agreement and understanding.  Patient to leave in private vehicle with all belongings.   

## 2019-12-05 ENCOUNTER — Telehealth: Payer: Self-pay

## 2019-12-05 LAB — CYTOLOGY - NON PAP

## 2019-12-05 NOTE — Telephone Encounter (Signed)
LM requesting call back to complete TCM and schedule hospital follow up.   

## 2019-12-06 ENCOUNTER — Encounter: Payer: Self-pay | Admitting: Family Medicine

## 2019-12-06 ENCOUNTER — Ambulatory Visit (INDEPENDENT_AMBULATORY_CARE_PROVIDER_SITE_OTHER): Payer: 59 | Admitting: Family Medicine

## 2019-12-06 ENCOUNTER — Other Ambulatory Visit: Payer: Self-pay

## 2019-12-06 ENCOUNTER — Encounter: Payer: Self-pay | Admitting: Gastroenterology

## 2019-12-06 ENCOUNTER — Telehealth: Payer: Self-pay | Admitting: Family Medicine

## 2019-12-06 VITALS — BP 118/70 | HR 103 | Temp 98.1°F | Ht 60.0 in | Wt 114.0 lb

## 2019-12-06 DIAGNOSIS — R109 Unspecified abdominal pain: Secondary | ICD-10-CM

## 2019-12-06 DIAGNOSIS — K2900 Acute gastritis without bleeding: Secondary | ICD-10-CM | POA: Insufficient documentation

## 2019-12-06 DIAGNOSIS — K859 Acute pancreatitis without necrosis or infection, unspecified: Secondary | ICD-10-CM

## 2019-12-06 DIAGNOSIS — E063 Autoimmune thyroiditis: Secondary | ICD-10-CM | POA: Diagnosis not present

## 2019-12-06 DIAGNOSIS — T783XXD Angioneurotic edema, subsequent encounter: Secondary | ICD-10-CM

## 2019-12-06 DIAGNOSIS — M79604 Pain in right leg: Secondary | ICD-10-CM | POA: Diagnosis not present

## 2019-12-06 DIAGNOSIS — L509 Urticaria, unspecified: Secondary | ICD-10-CM

## 2019-12-06 DIAGNOSIS — K2990 Gastroduodenitis, unspecified, without bleeding: Secondary | ICD-10-CM | POA: Diagnosis not present

## 2019-12-06 DIAGNOSIS — G8191 Hemiplegia, unspecified affecting right dominant side: Secondary | ICD-10-CM

## 2019-12-06 DIAGNOSIS — R946 Abnormal results of thyroid function studies: Secondary | ICD-10-CM

## 2019-12-06 DIAGNOSIS — K862 Cyst of pancreas: Secondary | ICD-10-CM

## 2019-12-06 HISTORY — DX: Gastroduodenitis, unspecified, without bleeding: K29.90

## 2019-12-06 LAB — CYTOLOGY - NON PAP

## 2019-12-06 MED ORDER — FAMOTIDINE 20 MG PO TABS: 20.0000 mg | ORAL_TABLET | Freq: Two times a day (BID) | ORAL | 1 refills | Status: DC

## 2019-12-06 MED ORDER — TRAMADOL HCL 50 MG PO TABS: 50.0000 mg | ORAL_TABLET | Freq: Three times a day (TID) | ORAL | 1 refills | Status: DC | PRN

## 2019-12-06 MED ORDER — OMEPRAZOLE 40 MG PO CPDR: 40.0000 mg | DELAYED_RELEASE_CAPSULE | Freq: Every day | ORAL | 3 refills | Status: DC

## 2019-12-06 MED ORDER — METHYLPREDNISOLONE ACETATE 80 MG/ML IJ SUSP
80.0000 mg | Freq: Once | INTRAMUSCULAR | Status: AC
  Administered 2019-12-06: 80 mg via INTRAMUSCULAR

## 2019-12-06 MED ORDER — LEVOCETIRIZINE DIHYDROCHLORIDE 5 MG PO TABS: 5.0000 mg | ORAL_TABLET | Freq: Every evening | ORAL | 1 refills | Status: DC

## 2019-12-06 MED ORDER — HYDROXYZINE HCL 10 MG PO TABS: 10.0000 mg | ORAL_TABLET | Freq: Every day | ORAL | 1 refills | Status: DC

## 2019-12-06 NOTE — Progress Notes (Signed)
This visit occurred during the SARS-CoV-2 public health emergency.  Safety protocols were in place, including screening questions prior to the visit, additional usage of staff PPE, and extensive cleaning of exam room while observing appropriate contact time as indicated for disinfecting solutions.    [name] , July 12, 1961, 59 y.o., female MRN: [id number] Patient Care Team    Relationship Specialty Notifications Start End  [name], DO PCP - General Family Medicine  10/13/17   Constance Haw, MD PCP - Electrophysiology Cardiology Admissions 08/28/17   Satira Sark, MD PCP - Cardiology Cardiology Admissions 12/15/17   Rosalin Hawking, MD Consulting Physician Neurology  10/13/17   Jamse Arn, MD Consulting Physician Physical Medicine and Rehabilitation  10/13/17   Luanne Bras, MD Consulting Physician Interventional Radiology  10/13/17     Chief Complaint  Patient presents with  . Allergic Reaction    Pt reacted to a new medication give to her at the [facility] was given a medication for reflux and antibiotic. Pt has welts all over her body and facial swelling. Pt states that the itch is severe - pt scratching herself until she bleeds. Pt has been seen 4 times in the last 4 months for Pancreatitis / stomach issues which prompted the med change.      Subjective: Pt presents for an OV to discuss her recent hospital admission and her ongoing urticaria, pancreatitis/GI issues and new right hip pain.  Hashimoto's thyroiditis/Urticaria/Thyroid function study abnormality/Angioedema, subsequent encounter This has been an ongoing issue for her since one of her early hospitalizations with her stroke.  She has been tried on multiple regimens including antidepressants, many different types of antihistamines and Pepcid.  Nothing seems to completely resolve her urticaria.  She has seen an allergist which he also did not find causes of her urticaria.  Different types of  medication has been suspected over the course of time and all have been ruled out as possible causes.  She does have elevated TPO antibodies with the rest of her thyroid panel being normal.  Suspected possible urticaria from TPO antibody/autoimmune causes.  Abdominal pain/Acute recurrent pancreatitis/Gastritis and duodenitis/Pancreatitis, recurrent/Pancreatic cyst Patient has had multiple recurrences of her pancreatitis and stomach discomfort.  Recent admission was found to have pancreatitis, gastritis and duodenitis by imaging.  She reports they started her on Protonix in the hospital and she would like to stay on the omeprazole.  She had not been taking the Pepcid.  SHe is following with GI who is performing biopsies of her stomach and pancreatic cyst.  Right hemiplegia (HCC)/Right leg pain Patient reports new onset right hip discomfort over the last few weeks.  She reports it is becoming extremely painful to move her hip.  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  She is starting physical therapy soon, she states they are coming out to the house this week to get started. X-ray completed 11/29/2019 no evidence of hip fracture or dislocation.  Minimal osteophyte formation in the superior lateral aspect of the right acetabulum  Depression screen [facility] 2/9 11/22/2018 10/22/2018 10/22/2018 10/19/2018  Decreased Interest 0 0 0 0  Down, Depressed, Hopeless 0 0 0 0  PHQ - 2 Score 0 0 0 0  Altered sleeping - 0 - -  Tired, decreased energy - 1 - -  Change in appetite - 0 - -  Feeling bad or failure about yourself  - 0 - -  Trouble concentrating - 0 - -  Moving slowly or fidgety/restless - 0 - -  Suicidal thoughts - 0 - -  PHQ-9 Score - 1 - -  Difficult doing work/chores - Not difficult at all - -  Some recent data might be hidden    Allergies  Allergen Reactions  . Lopressor [Metoprolol Tartrate] Swelling    Angioedema  . Allegra Allergy [Fexofenadine Hcl] Hives  . Dilaudid  [Hydromorphone]     Hallucinations  . Nsaids     On Xarelto  . Gabapentin (Once-Daily) Rash  . Lipitor [Atorvastatin] Diarrhea   Social History   Social History Narrative   Married. 2 children.    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    Walks with cane or wheelchair.    Former smoker.    Drinks caffeine.    Smoke alarm in the home.   Firearms in the home.    Wears her seatbelt.    Feels safe In her relationships.    Past Medical History:  Diagnosis Date  . Angio-edema   . Arthritis   . CAD (coronary artery disease)    a. s/p NSTEMI in 10/2017 with cath showing LM disease --> s/p CABG on 11/23/2017 with LIMA-LAD and SVG-OM.   [name] Epigastric pain    mid  . Hay fever   . Ischemic cardiomyopathy    a. EF 35-40% by echo in 10/2017  . NSTEMI (non-ST elevated myocardial infarction) ([place])   . PAF (paroxysmal atrial fibrillation) ([place])    Diagnosed by ILR  . Pancreatitis   . Pneumonia 11/16/2017  . Stroke [facility]) 08/23/2017   Left MCA infarct status post TPA and thrombectomy  . Urticaria    Past Surgical History:  Procedure Laterality Date  . BIOPSY  12/02/2019   Procedure: BIOPSY;  Surgeon: Rush Landmark Telford Nab., MD;  Location: [facility];  Service: Gastroenterology;;  . CORONARY ARTERY BYPASS GRAFT N/A 11/23/2017   Procedure: CORONARY ARTERY BYPASS GRAFTING (CABG) x 2, ON PUMP, USING LEFT INTERNAL MAMMARY ARTERY AND RIGHT GREATER SAPHENOUS VEIN HARVESTED ENDOSCOPICALLY;  Surgeon: Gaye Pollack, MD;  Location: [place];  Service: Open Heart Surgery;  Laterality: N/A;  . CRYOABLATION     cervical  . ESOPHAGOGASTRODUODENOSCOPY (EGD) WITH PROPOFOL N/A 12/02/2019   Procedure: ESOPHAGOGASTRODUODENOSCOPY (EGD) WITH PROPOFOL;  Surgeon: Rush Landmark Telford Nab., MD;  Location: [place];  Service: Gastroenterology;  Laterality: N/A;  . FINE NEEDLE ASPIRATION  12/02/2019   Procedure: FINE NEEDLE ASPIRATION (FNA) LINEAR;  Surgeon: Irving Copas., MD;  Location: [facility];  Service:  Gastroenterology;;  . IR ANGIO INTRA EXTRACRAN SEL COM CAROTID INNOMINATE UNI R MOD SED  08/21/2017  . IR ANGIO VERTEBRAL SEL SUBCLAVIAN INNOMINATE UNI R MOD SED  08/21/2017  . IR PERCUTANEOUS ART THROMBECTOMY/INFUSION INTRACRANIAL INC DIAG ANGIO  08/21/2017  . IR RADIOLOGIST EVAL & MGMT  10/30/2017  . LOOP RECORDER INSERTION N/A 08/28/2017   Procedure: LOOP RECORDER INSERTION;  Surgeon: Constance Haw, MD;  Location: [facility] CV LAB;  Service: Cardiovascular;  Laterality: N/A;  . RADIOLOGY WITH ANESTHESIA N/A 08/21/2017   Procedure: RADIOLOGY WITH ANESTHESIA;  Surgeon: Luanne Bras, MD;  Location: [place];  Service: Radiology;  Laterality: N/A;  . RIGHT/LEFT HEART CATH AND CORONARY ANGIOGRAPHY N/A 11/20/2017   Procedure: RIGHT/LEFT HEART CATH AND CORONARY ANGIOGRAPHY;  Surgeon: Jettie Booze, MD;  Location: [facility] CV LAB;  Service: Cardiovascular;  Laterality: N/A;  . TEE WITHOUT CARDIOVERSION N/A 08/28/2017   Procedure: TRANSESOPHAGEAL ECHOCARDIOGRAM (TEE);  Surgeon: Fay Records, MD;  Location: [facility]  ENDOSCOPY;  Service: Cardiovascular;  Laterality: N/A;  . TEE WITHOUT CARDIOVERSION N/A 11/23/2017   Procedure: TRANSESOPHAGEAL ECHOCARDIOGRAM (TEE);  Surgeon: Gaye Pollack, MD;  Location: [place];  Service: Open Heart Surgery;  Laterality: N/A;  . TUBAL LIGATION    . UPPER ESOPHAGEAL ENDOSCOPIC ULTRASOUND (EUS) N/A 12/02/2019   Procedure: UPPER ESOPHAGEAL ENDOSCOPIC ULTRASOUND (EUS);  Surgeon: Irving Copas., MD;  Location: [place];  Service: Gastroenterology;  Laterality: N/A;   Family History  Problem Relation Age of Onset  . Heart attack Father   . Hypertension Mother   . Hyperlipidemia Mother   . Diabetes type II Mother   . Urticaria Mother   . Hypertension Brother   . Breast cancer Cousin 89  . Immunodeficiency Neg Hx   . Eczema Neg Hx   . Atopy Neg Hx   . Asthma Neg Hx   . Angioedema Neg Hx   . Allergic rhinitis Neg Hx   . Colon cancer Neg Hx   .  Esophageal cancer Neg Hx   . Stomach cancer Neg Hx   . Pancreatic cancer Neg Hx   . Colon polyps Neg Hx   . Esophageal varices Neg Hx    Allergies as of 12/06/2019      Reactions   Lopressor [metoprolol Tartrate] Swelling   Angioedema   Allegra Allergy [fexofenadine Hcl] Hives   Dilaudid [hydromorphone]    Hallucinations   Nsaids    On Xarelto   Gabapentin (once-daily) Rash   Lipitor [atorvastatin] Diarrhea      Medication List       Accurate as of December 06, 2019 11:59 PM. If you have any questions, ask your nurse or doctor.        STOP taking these medications   ciprofloxacin 500 MG tablet Commonly known as: CIPRO Stopped by: Howard Pouch, DO   mupirocin cream 2 % Commonly known as: Bactroban Stopped by: Howard Pouch, DO   pantoprazole 40 MG tablet Commonly known as: PROTONIX Stopped by: Howard Pouch, DO     TAKE these medications   acetaminophen 325 MG tablet Commonly known as: TYLENOL Take 2 tablets (650 mg total) by mouth every 6 (six) hours as needed for mild pain or moderate pain (or Fever >/= 101).   azelastine 0.1 % nasal spray Commonly known as: ASTELIN Place 1-2 sprays into both nostrils 2 (two) times daily as needed for rhinitis. Use in each nostril as directed   baclofen 10 MG tablet Commonly known as: LIORESAL Take 10 mg by mouth in the morning and at bedtime.   calcium carbonate 500 MG chewable tablet Commonly known as: TUMS - dosed in mg elemental calcium Chew 1 tablet by mouth 2 (two) times daily.   carvedilol 3.125 MG tablet Commonly known as: COREG Take 1 tablet by mouth twice daily What changed: when to take this   cholecalciferol 1000 units tablet Commonly known as: VITAMIN D Take 1,000 Units by mouth daily.   famotidine 20 MG tablet Commonly known as: Pepcid Take 1 tablet (20 mg total) by mouth 2 (two) times daily. Started by: Howard Pouch, DO   ferrous sulfate 325 (65 FE) MG tablet Take 325 mg by mouth 2 (two) times a week.  Mondays  and Thursdays.   hydrOXYzine 10 MG tablet Commonly known as: ATARAX/VISTARIL Take 1 tablet (10 mg total) by mouth at bedtime. What changed:   when to take this  reasons to take this   levocetirizine 5 MG tablet Commonly known as:  XYZAL Take 1 tablet (5 mg total) by mouth every evening.   omeprazole 40 MG capsule Commonly known as: PRILOSEC Take 1 capsule (40 mg total) by mouth daily. Started by: Howard Pouch, DO   ondansetron 4 MG disintegrating tablet Commonly known as: Zofran ODT Take 1 tablet (4 mg total) by mouth every 8 (eight) hours as needed for nausea or vomiting.   pravastatin 20 MG tablet Commonly known as: Pravachol Take 1 tablet (20 mg total) by mouth daily. What changed: when to take this   rivaroxaban 20 MG Tabs tablet Commonly known as: Xarelto Take 1 tablet daily with evening meal   traMADol 50 MG tablet Commonly known as: ULTRAM Take 1-2 tablets (50-100 mg total) by mouth every 8 (eight) hours as needed for moderate pain. What changed:   how much to take  when to take this Changed by: Howard Pouch, DO   vitamin B-12 100 MCG tablet Commonly known as: CYANOCOBALAMIN Take 100 mcg by mouth daily.       All past medical history, surgical history, allergies, family history, immunizations andmedications were updated in the EMR today and reviewed under the history and medication portions of their EMR.     ROS: Negative, with the exception of above mentioned in HPI   Objective:  BP 118/70 (BP Location: Left Arm, Patient Position: Sitting, Cuff Size: Normal)   Pulse (!) 103   Temp 98.1 F (36.7 C) (Temporal)   Ht 5' (1.524 m)   Wt 114 lb (51.7 kg)   LMP  (LMP Unknown) Comment: at age 39  SpO2 95%   BMI 22.26 kg/m  Body mass index is 22.26 kg/m. Gen: Afebrile. No acute distress. Nontoxic in appearance.  Thin Caucasian female HENT: AT.  Normocephalic.  With mild angioedema remaining bilateral sides of face near mandible Eyes:Pupils  Equal Round Reactive to light, Extraocular movements intact,  Conjunctiva without redness, discharge or icterus. CV: RRR, no edema Chest: CTAB, no wheeze or crackles. Good air movement, normal resp effort.  Abd: Soft. NTND. BS presnt. no Masses palpated. No rebound or guarding.  MSK: right  Leg muscle atrophy present. Pain with hip flexion in thigh radiating to hip.  Skin: Urticaria flat maculopapular itchy rash present over trunk, no purpura or petechiae.  Neuro: Discomfort with gait present. PERLA. EOMi. Alert. Oriented x3  Muscle strength 4/5 right lower extremity and hip flexion with pain.[name] DTRs equal bilaterally. Psych: Normal affect, dress and demeanor. Normal speech. Normal thought content and judgment.  No exam data present No results found. No results found for this or any previous visit (from the past 24 hour(s)).  Assessment/Plan: [name] is a 59 y.o. female present for OV for  Hashimoto's thyroiditis/Urticaria/Thyroid function study abnormality/Angioedema, subsequent encounter -Discussed referral to endocrinology with her today.  She has elevated TPO antibodies with normal TSH and T4 free.  Considered this as possible cause of her chronic urticaria and angioedema.  She has been seen by the allergist who did not find any causes of her urticaria outside of possible TPO antibodies. - Continue Xyzal.  Continue Pepcid.  Continue Vistaril. - Ambulatory referral to Endocrinology - methylPREDNISolone acetate (DEPO-MEDROL) injection 80 mg  Abdominal pain/Acute recurrent pancreatitis/Gastritis and duodenitis/Pancreatitis, recurrent/Pancreatic cyst SHe is following closely with GI.  They are performing biopsies of her stomach and pancreatic cyst.  She is awaiting results. Patient is frustrated with the recurrence of the pancreatitis and abdominal pain without any resolution.  Unfortunately I do not have any more  answers for her other than to continue to follow with GI and if she does  not eventually receive answers we can always refer her for a second opinion. Continue omeprazole Restart Pepcid Follow with GI   Right hemiplegia (HCC)/Right leg pain Encourage patient to start working with physical therapy this week working on strengthening.  If pain worsens with physical therapy instead of improving follow-up immediately otherwise follow-up in 4 weeks on this condition.  Her pain mostly seems to be located near her hip flexors and there is pain with hip flexion, but can potentially be coming from the small osteophyte in the joint. -NSAIDs contraindicated -patient on Xarelto -Tramadol 50-100 mg every 8 hours as needed was prescribed for pain.  This is now a chronic prescription for her. [place] controlled substance database reviewed today and appropriate. -Continue aqua therapy - reviewed xray completed in the hospital -small osteophyte present possibly causing pain. - IM dpeo medrol may help with her hip pain as well.      Reviewed expectations re: course of current medical issues.  Discussed self-management of symptoms.  Outlined signs and symptoms indicating need for more acute intervention.  Patient verbalized understanding and all questions were answered.  Patient received an After-Visit Summary.   65 minutes was spent covering multiple questions concerning her multiple chronic conditions, recent hospitalizations and new acute conditions.   Orders Placed This Encounter  Procedures  . Ambulatory referral to Endocrinology   Meds ordered this encounter  Medications  . hydrOXYzine (ATARAX/VISTARIL) 10 MG tablet    Sig: Take 1 tablet (10 mg total) by mouth at bedtime.    Dispense:  90 tablet    Refill:  1    Hold until pt request  . levocetirizine (XYZAL) 5 MG tablet    Sig: Take 1 tablet (5 mg total) by mouth every evening.    Dispense:  180 tablet    Refill:  1  . famotidine (PEPCID) 20 MG tablet    Sig: Take 1 tablet (20 mg total) by mouth 2  (two) times daily.    Dispense:  180 tablet    Refill:  1  . omeprazole (PRILOSEC) 40 MG capsule    Sig: Take 1 capsule (40 mg total) by mouth daily.    Dispense:  30 capsule    Refill:  3  . methylPREDNISolone acetate (DEPO-MEDROL) injection 80 mg  . traMADol (ULTRAM) 50 MG tablet    Sig: Take 1-2 tablets (50-100 mg total) by mouth every 8 (eight) hours as needed for moderate pain.    Dispense:  180 tablet    Refill:  1    Referral Orders     Ambulatory referral to Endocrinology   Note is dictated utilizing voice recognition software. Although note has been proof read prior to signing, occasional typographical errors still can be missed. If any questions arise, please do not hesitate to call for verification.   electronically signed by:  Howard Pouch, DO  [place]

## 2019-12-06 NOTE — Telephone Encounter (Signed)
Patient called at 5:26 PM and spoke with the triage nurse at [facility] on March 11. Stated she came home from the hospital the day before. She was started on Protonix and Cipro and they are both new medication's to her. She states she is not going to continue to take them because she has welts all over. Her face is swollen and she can barely talk. Patient is taking tramadol for pain but is going to see about getting something stronger.  This note was sent over via fax but was not in the [facility] portal to place into the chart.

## 2019-12-06 NOTE — Patient Instructions (Addendum)
I have referred you to endocrine to discuss your thyroid antibodies and their association with your hives.   If you need a referral to a specialist for your stomach>> I need the name, location  and speciality of that physician.   I will refill your tramadol>> give it until later this evening to be called in.   Stop protonix- start omperazole and restart pepcid.   Gastritis, Adult  Gastritis is swelling (inflammation) of the stomach. Gastritis can develop quickly (acute). It can also develop slowly over time (chronic). It is important to get help for this condition. If you do not get help, your stomach can bleed, and you can get sores (ulcers) in your stomach. What are the causes? This condition may be caused by:  Germs that get to your stomach.  Drinking too much alcohol.  Medicines you are taking.  Too much acid in the stomach.  A disease of the intestines or stomach.  Stress.  An allergic reaction.  Crohn's disease.  Some cancer treatments (radiation). Sometimes the cause of this condition is not known. What are the signs or symptoms? Symptoms of this condition include:  Pain in your stomach.  A burning feeling in your stomach.  Feeling sick to your stomach (nauseous).  Throwing up (vomiting).  Feeling too full after you eat.  Weight loss.  Bad breath.  Throwing up blood.  Blood in your poop (stool). How is this diagnosed? This condition may be diagnosed with:  Your medical history and symptoms.  A physical exam.  Tests. These can include: ? Blood tests. ? Stool tests. ? A procedure to look inside your stomach (upper endoscopy). ? A test in which a sample of tissue is taken for testing (biopsy). How is this treated? Treatment for this condition depends on what caused it. You may be given:  Antibiotic medicine, if your condition was caused by germs.  H2 blockers and similar medicines, if your condition was caused by too much acid. Follow these  instructions at home: Medicines  Take over-the-counter and prescription medicines only as told by your doctor.  If you were prescribed an antibiotic medicine, take it as told by your doctor. Do not stop taking it even if you start to feel better. Eating and drinking   Eat small meals often, instead of large meals.  Avoid foods and drinks that make your symptoms worse.  Drink enough fluid to keep your pee (urine) pale yellow. Alcohol use  Do not drink alcohol if: ? Your doctor tells you not to drink. ? You are pregnant, may be pregnant, or are planning to become pregnant.  If you drink alcohol: ? Limit your use to:  0-1 drink a day for women.  0-2 drinks a day for men. ? Be aware of how much alcohol is in your drink. In the [place] one drink equals one 12 oz bottle of beer (355 mL), one 5 oz glass of wine (148 mL), or one 1 oz glass of hard liquor (44 mL). General instructions  Talk with your doctor about ways to manage stress. You can exercise or do deep breathing, meditation, or yoga.  Do not smoke or use products that have nicotine or tobacco. If you need help quitting, ask your doctor.  Keep all follow-up visits as told by your doctor. This is important. Contact a doctor if:  Your symptoms get worse.  Your symptoms go away and then come back. Get help right away if:  You throw up blood or something  that looks like coffee grounds.  You have black or dark red poop.  You throw up any time you try to drink fluids.  Your stomach pain gets worse.  You have a fever.  You do not feel better after one week. Summary  Gastritis is swelling (inflammation) of the stomach.  You must get help for this condition. If you do not get help, your stomach can bleed, and you can get sores (ulcers).  This condition is diagnosed with medical history, physical exam, or tests.  You can be treated with medicines for germs or medicines to block too much acid in your stomach. This  information is not intended to replace advice given to you by your health care provider. Make sure you discuss any questions you have with your health care provider. Document Revised: 01/30/2018 Document Reviewed: 01/30/2018 Elsevier Patient Education  [place].    Gastroesophageal Reflux Disease, Adult Gastroesophageal reflux (GER) happens when acid from the stomach flows up into the tube that connects the mouth and the stomach (esophagus). Normally, food travels down the esophagus and stays in the stomach to be digested. With GER, food and stomach acid sometimes move back up into the esophagus. You may have a disease called gastroesophageal reflux disease (GERD) if the reflux:  Happens often.  Causes frequent or very bad symptoms.  Causes problems such as damage to the esophagus. When this happens, the esophagus becomes sore and swollen (inflamed). Over time, GERD can make small holes (ulcers) in the lining of the esophagus. What are the causes? This condition is caused by a problem with the muscle between the esophagus and the stomach. When this muscle is weak or not normal, it does not close properly to keep food and acid from coming back up from the stomach. The muscle can be weak because of:  Tobacco use.  Pregnancy.  Having a certain type of hernia (hiatal hernia).  Alcohol use.  Certain foods and drinks, such as coffee, chocolate, onions, and peppermint. What increases the risk? You are more likely to develop this condition if you:  Are overweight.  Have a disease that affects your connective tissue.  Use NSAID medicines. What are the signs or symptoms? Symptoms of this condition include:  Heartburn.  Difficult or painful swallowing.  The feeling of having a lump in the throat.  A bitter taste in the mouth.  Bad breath.  Having a lot of saliva.  Having an upset or bloated stomach.  Belching.  Chest pain. Different conditions can cause chest  pain. Make sure you see your doctor if you have chest pain.  Shortness of breath or noisy breathing (wheezing).  Ongoing (chronic) cough or a cough at night.  Wearing away of the surface of teeth (tooth enamel).  Weight loss. How is this treated? Treatment will depend on how bad your symptoms are. Your doctor may suggest:  Changes to your diet.  Medicine.  Surgery. Follow these instructions at home: Eating and drinking   Follow a diet as told by your doctor. You may need to avoid foods and drinks such as: ? Coffee and tea (with or without caffeine). ? Drinks that contain alcohol. ? Energy drinks and sports drinks. ? Bubbly (carbonated) drinks or sodas. ? Chocolate and cocoa. ? Peppermint and mint flavorings. ? Garlic and onions. ? Horseradish. ? Spicy and acidic foods. These include peppers, chili powder, curry powder, vinegar, hot sauces, and BBQ sauce. ? Citrus fruit juices and citrus fruits, such as oranges,  lemons, and limes. ? Tomato-based foods. These include red sauce, chili, salsa, and pizza with red sauce. ? Fried and fatty foods. These include donuts, french fries, potato chips, and high-fat dressings. ? High-fat meats. These include hot dogs, rib eye steak, sausage, ham, and bacon. ? High-fat dairy items, such as whole milk, butter, and cream cheese.  Eat small meals often. Avoid eating large meals.  Avoid drinking large amounts of liquid with your meals.  Avoid eating meals during the 2-3 hours before bedtime.  Avoid lying down right after you eat.  Do not exercise right after you eat. Lifestyle   Do not use any products that contain nicotine or tobacco. These include cigarettes, e-cigarettes, and chewing tobacco. If you need help quitting, ask your doctor.  Try to lower your stress. If you need help doing this, ask your doctor.  If you are overweight, lose an amount of weight that is healthy for you. Ask your doctor about a safe weight loss  goal. General instructions  Pay attention to any changes in your symptoms.  Take over-the-counter and prescription medicines only as told by your doctor. Do not take aspirin, ibuprofen, or other NSAIDs unless your doctor says it is okay.  Wear loose clothes. Do not wear anything tight around your waist.  Raise (elevate) the head of your bed about 6 inches (15 cm).  Avoid bending over if this makes your symptoms worse.  Keep all follow-up visits as told by your doctor. This is important. Contact a doctor if:  You have new symptoms.  You lose weight and you do not know why.  You have trouble swallowing or it hurts to swallow.  You have wheezing or a cough that keeps happening.  Your symptoms do not get better with treatment.  You have a hoarse voice. Get help right away if:  You have pain in your arms, neck, jaw, teeth, or back.  You feel sweaty, dizzy, or light-headed.  You have chest pain or shortness of breath.  You throw up (vomit) and your throw-up looks like blood or coffee grounds.  You pass out (faint).  Your poop (stool) is bloody or black.  You cannot swallow, drink, or eat. Summary  If a person has gastroesophageal reflux disease (GERD), food and stomach acid move back up into the esophagus and cause symptoms or problems such as damage to the esophagus.  Treatment will depend on how bad your symptoms are.  Follow a diet as told by your doctor.  Take all medicines only as told by your doctor. This information is not intended to replace advice given to you by your health care provider. Make sure you discuss any questions you have with your health care provider. Document Revised: 03/21/2018 Document Reviewed: 03/21/2018 Elsevier Patient Education  [place].

## 2019-12-15 ENCOUNTER — Other Ambulatory Visit: Payer: Self-pay | Admitting: Family Medicine

## 2019-12-17 ENCOUNTER — Encounter: Payer: Self-pay | Admitting: Gastroenterology

## 2019-12-17 ENCOUNTER — Telehealth: Payer: Self-pay

## 2019-12-17 ENCOUNTER — Other Ambulatory Visit: Payer: Self-pay

## 2019-12-17 NOTE — Telephone Encounter (Signed)
Mansouraty, Telford Nab., MD  Timothy Lasso, RN  Please send letter when able. Please schedule a repeat EUS in 4 weeks. Thanks.  GM   Letter mailed.

## 2019-12-17 NOTE — Progress Notes (Signed)
Letter mailed

## 2019-12-19 ENCOUNTER — Other Ambulatory Visit (HOSPITAL_COMMUNITY): Payer: 59

## 2019-12-22 LAB — CUP PACEART REMOTE DEVICE CHECK
Date Time Interrogation Session: 20210328023133
Implantable Pulse Generator Implant Date: 20181203

## 2019-12-23 ENCOUNTER — Ambulatory Visit (INDEPENDENT_AMBULATORY_CARE_PROVIDER_SITE_OTHER): Payer: 59 | Admitting: *Deleted

## 2019-12-23 DIAGNOSIS — I63512 Cerebral infarction due to unspecified occlusion or stenosis of left middle cerebral artery: Secondary | ICD-10-CM | POA: Diagnosis not present

## 2019-12-23 NOTE — Progress Notes (Signed)
ILR Remote 

## 2019-12-23 NOTE — Telephone Encounter (Signed)
01/24/20 at 1110 am appt made to discuss with Dr Rush Landmark

## 2019-12-25 ENCOUNTER — Telehealth: Payer: Self-pay

## 2019-12-25 NOTE — Telephone Encounter (Signed)
Patient is requesting call back about Rx's  hydrOXYzine (ATARAX/VISTARIL) 10 MG tablet  Furosemide 20MG  but patient thinks she only takes 1/2 tablet Patient would like to know if she should take her 4 tylenols and 4 tramadols prior to her COVID vaccine.  Telephone connection was poor. It was very difficult to understand patient.

## 2019-12-25 NOTE — Telephone Encounter (Signed)
Pt was called and told to call pharmacy that the meds were sent on 03/12 and to reach out to them. Pt was advised per CDC she should not take NSAIDS before COVID vaccine but can for symptoms afterwards if okay with PCP to take.   After reviewing pts med list pt was also advised to call cardiology due to taking 4 Tylenol qd, 4 Tramadol qd, and Xarelto. Pt states she recently had to increase the Tylenol due to pain. She was advised to speak with them about the increase in Tylenol use. Sent to Dr Raoul Pitch to review as well

## 2019-12-25 NOTE — Telephone Encounter (Signed)
Tramadol> she may take 1-2 tabs every 8 hours for pain (per label) Tylenol> I would encourage her to use the tramadol to full potential if needing additional coverage for pain. It is ok if she has to, but would prefer she try to take as least amount of medication types if possible.   Lastly, no nsaids. AVOID all NSAIDS (advil, aleve etc) while on blood thinner.

## 2019-12-26 NOTE — Telephone Encounter (Signed)
Pt was called and given all information she verbalized understanding

## 2020-01-09 ENCOUNTER — Ambulatory Visit (INDEPENDENT_AMBULATORY_CARE_PROVIDER_SITE_OTHER): Payer: 59 | Admitting: Gastroenterology

## 2020-01-09 ENCOUNTER — Encounter: Payer: Self-pay | Admitting: Gastroenterology

## 2020-01-09 ENCOUNTER — Ambulatory Visit: Payer: 59

## 2020-01-09 ENCOUNTER — Other Ambulatory Visit: Payer: Self-pay

## 2020-01-09 VITALS — BP 114/64 | HR 84 | Temp 97.5°F | Ht 61.0 in | Wt 111.4 lb

## 2020-01-09 DIAGNOSIS — K3189 Other diseases of stomach and duodenum: Secondary | ICD-10-CM

## 2020-01-09 DIAGNOSIS — K5900 Constipation, unspecified: Secondary | ICD-10-CM

## 2020-01-09 DIAGNOSIS — K862 Cyst of pancreas: Secondary | ICD-10-CM | POA: Diagnosis not present

## 2020-01-09 DIAGNOSIS — K31A0 Gastric intestinal metaplasia, unspecified: Secondary | ICD-10-CM

## 2020-01-09 DIAGNOSIS — K859 Acute pancreatitis without necrosis or infection, unspecified: Secondary | ICD-10-CM | POA: Diagnosis not present

## 2020-01-09 NOTE — Progress Notes (Signed)
P  Chief Complaint:    Hospital follow-up, acute recurrent pancreatitis, constipation  GI History: 59 year old female with a history of CVA, CAD/two-vessel CABG, CHF, atrial fibrillation (on Xarelto), now with acute recurrent pancreatitis of undiagnosed etiology.  -Admitted 12/15-18,2020 with acute pancreatitis with unclear etiology. -CT notable for 2.2 cm cyst versus pseudocyst in the head of pancreas.   -Readmitted 1/17-18, 2021 with acute recurrent pancreatitis. -CT acute pancreatitis, slight improvement in size of pseudocyst, wall thickening of prepyloric stomach and proximal duodenum (possibly reactive) -IgG4 negative -Lasix stopped  -Readmitted 2/10-13, 2021 with acute recurrent pancreatitis -Lipase >10k, improved to 837 then 74 by d/c -WBC 17.5, hemoconcentration, normal BUN/creatinine -Blood cultures negative -Admission CT with possible cystic duct stones peripancreatic phlegmon and edema c/w acute pancreatitis.  Stable cystic lesion in HOP -RUQ [place]: No gallstones, CBD 5 mm, hypoechoic HOP lesion (cystic) -MRI/MRCP 09/05/2020: Hepatic steatosis, mild GB distention, focally ectatic CBD measuring 8 mm without CDL.  Pancreatic edema c/w AP, 1.8 x 1.3 cm heterogenous structure (favoring pseudocyst), previously 1.8 x 1.9 cm.  Cystic duct stones not appreciated. -Liver enzymes remained normal, but consideration for microlithiasis as etiology -Stopped Entresto  -Readmitted 3/5-9, 2021 -CT: Decreased peripancreatic inflammation around the head/uncinate process, but now inflammation around first/second portions of duodenum with duodenal wall thickening.  Stable pancreatic head pseudocyst -EUS (12/02/2019, [name] Landmark): 1.2 cm pancreatic cyst (FNA: No malignancy), Biopsy of HOP (no malignancy).  Peptic duodenitis, gastritis with intestinal metaplasia without H. pylori  -Developed rash with pantoprazole.  Stopped and changed to Pepcid for treatment of gastritis/duodenitis without  issue.  HPI:     Patient is a 59 y.o. female presenting to the [facility] for follow-up.  Last outpatient 11/11/2019, and was feeling well at that time.  Unfortunately, was readmitted on 11/29/2018 with recurrence of acute pancreatitis, the fourth episode/admission.  Today, she states she has had constipation and intermittent symptomatic hemorrhoids. Constipation seems to worsen after pancreatitis events. Controlled with OTC laxatives. Otherwise, good PO intake. 4# down over the last month or so.  No recurrence of index pain.  Does have a cyst around her tailbone area. Has been present for 5-6 months. Tender to palpation. Wants it evaluated and resected/drained if possible.     Review of systems:     No chest pain, no SOB, no fevers, no urinary sx   Past Medical History:  Diagnosis Date  . Angio-edema   . Arthritis   . CAD (coronary artery disease)    a. s/p NSTEMI in 10/2017 with cath showing LM disease --> s/p CABG on 11/23/2017 with LIMA-LAD and SVG-OM.   [name] Epigastric pain    mid  . Hay fever   . Ischemic cardiomyopathy    a. EF 35-40% by echo in 10/2017  . NSTEMI (non-ST elevated myocardial infarction) ([place])   . PAF (paroxysmal atrial fibrillation) ([place])    Diagnosed by ILR  . Pancreatitis   . Pneumonia 11/16/2017  . Stroke [facility]) 08/23/2017   Left MCA infarct status post TPA and thrombectomy  . Urticaria     Patient's surgical history, family medical history, social history, medications and allergies were all reviewed in Epic    Current Outpatient Medications  Medication Sig Dispense Refill  . acetaminophen (TYLENOL) 325 MG tablet Take 2 tablets (650 mg total) by mouth every 6 (six) hours as needed for mild pain or moderate pain (or Fever >/= 101).    [name] azelastine (ASTELIN) 0.1 % nasal spray Place 1-2 sprays  into both nostrils 2 (two) times daily as needed for rhinitis. Use in each nostril as directed 30 mL 5  . baclofen (LIORESAL) 10 MG tablet Take 10 mg by  mouth in the morning and at bedtime.    . calcium carbonate (TUMS - DOSED IN MG ELEMENTAL CALCIUM) 500 MG chewable tablet Chew 1 tablet by mouth 2 (two) times daily.     . carvedilol (COREG) 3.125 MG tablet Take 1 tablet by mouth twice daily (Patient taking differently: Take 3.125 mg by mouth in the morning and at bedtime. ) 180 tablet 1  . cholecalciferol (VITAMIN D) 1000 units tablet Take 1,000 Units by mouth daily.    . famotidine (PEPCID) 20 MG tablet Take 1 tablet (20 mg total) by mouth 2 (two) times daily. 180 tablet 1  . ferrous sulfate 325 (65 FE) MG tablet Take 325 mg by mouth 2 (two) times a week. Mondays  and Thursdays.    . hydrOXYzine (ATARAX/VISTARIL) 10 MG tablet Take 1 tablet (10 mg total) by mouth at bedtime. 90 tablet 1  . levocetirizine (XYZAL) 5 MG tablet Take 1 tablet (5 mg total) by mouth every evening. 180 tablet 1  . omeprazole (PRILOSEC) 40 MG capsule Take 1 capsule (40 mg total) by mouth daily. 30 capsule 3  . ondansetron (ZOFRAN ODT) 4 MG disintegrating tablet Take 1 tablet (4 mg total) by mouth every 8 (eight) hours as needed for nausea or vomiting. 30 tablet 0  . pravastatin (PRAVACHOL) 20 MG tablet Take 1 tablet (20 mg total) by mouth daily. (Patient taking differently: Take 20 mg by mouth every evening. ) 30 tablet 3  . rivaroxaban (XARELTO) 20 MG TABS tablet Take 1 tablet daily with evening meal 90 tablet 3  . traMADol (ULTRAM) 50 MG tablet Take 1-2 tablets (50-100 mg total) by mouth every 8 (eight) hours as needed for moderate pain. 180 tablet 1  . vitamin B-12 (CYANOCOBALAMIN) 100 MCG tablet Take 100 mcg by mouth daily.     No current facility-administered medications for this visit.    Physical Exam:     BP 114/64   Pulse 84   Temp (!) 97.5 F (36.4 C)   Ht 5\' 1"  (1.549 m)   Wt 111 lb 6 oz (50.5 kg)   LMP  (LMP Unknown) Comment: at age 59  BMI 21.04 kg/m   GENERAL:  Pleasant female in NAD PSYCH: : Cooperative, normal affect EENT:  conjunctiva pink,  mucous membranes moist, neck supple without masses CARDIAC:  RRR, no murmur heard, no peripheral edema PULM: Normal respiratory effort, lungs CTA bilaterally, no wheezing ABDOMEN:  Nondistended, soft, nontender. No obvious masses, no hepatomegaly,  normal bowel sounds MSK: Small area of TTP near coccyx.  Overlying skin was normal without any skin breaks. SKIN:  turgor, no lesions seen NEURO: Alert and oriented x 3   IMPRESSION and PLAN:    1) Acute recurrent pancreatitis of unclear etiology 2) Pancreatic cyst  Acute recurrent pancreatitis of unclear etiology with extensive evaluation as outlined above.  Recent EUS with benign cyst drainage.  Biopsies from [facility] were otherwise benign.     -Has follow-up appointment with [name] Landmark on 01/24/2020 with plan for repeat EUS -Consideration for genetic testing.  Discussed that today -With GB in situ, may need to consider ccy pending repeat EUS  3) Gastric intestinal metaplasia  -Gastritis along with intestinal metaplasia noted on recent EUS -Could consider repeat EGD in 6 months with GIM mapping.  Discussed that  with the patient today, and can reevaluate after evaluation of the above more pressing issues  4) Tenderness around coccyx -Focal area of tenderness around coccyx for the last 4-6 months, worsened with prolonged sitting.  Exam today without any skin breaks or overlying dermatologic issue.  Do not feel this is GI in origin.  Recommend follow-up with PCM with potential imaging and/or referral as appropriate  5) Constipation -Now relatively well controlled since resolution of acute pancreatitis event (query pancreatitis-associated ileus rather than true constipation). -Resume current management -Resume adequate hydration  I spent 35  minutes of time, including in depth chart review, independent review of results as outlined above, communicating results with the patient directly, face-to-face time with the patient, coordinating care,  and ordering studies and medications as appropriate, and documentation.       Lavena Bullion ,DO, FACG 01/09/2020, 9:11 AM

## 2020-01-09 NOTE — Patient Instructions (Signed)
It was a pleasure to see you today!  Vito Cirigliano, D.O.  

## 2020-01-10 ENCOUNTER — Ambulatory Visit: Payer: 59 | Attending: Internal Medicine

## 2020-01-10 DIAGNOSIS — Z23 Encounter for immunization: Secondary | ICD-10-CM

## 2020-01-10 NOTE — Progress Notes (Signed)
   Covid-19 Vaccination Clinic  Name:  [name]    MRN: [id number] DOB: June 27, 1961  01/10/2020  [name] was observed post Covid-19 immunization for 15 minutes without incident. She was provided with Vaccine Information Sheet and instruction to access the V-Safe system.   [name] was instructed to call 911 with any severe reactions post vaccine: [name] Difficulty breathing  . Swelling of face and throat  . A fast heartbeat  . A bad rash all over body  . Dizziness and weakness   Immunizations Administered    Name Date Dose VIS Date Route   Moderna COVID-19 Vaccine 01/10/2020 10:25 AM 0.5 mL 08/27/2019 Intramuscular   Manufacturer: Moderna   Lot: [id number]   [id number]

## 2020-01-14 ENCOUNTER — Ambulatory Visit (INDEPENDENT_AMBULATORY_CARE_PROVIDER_SITE_OTHER): Payer: 59 | Admitting: Internal Medicine

## 2020-01-14 ENCOUNTER — Encounter: Payer: Self-pay | Admitting: Internal Medicine

## 2020-01-14 ENCOUNTER — Other Ambulatory Visit: Payer: Self-pay

## 2020-01-14 VITALS — BP 118/60 | HR 79 | Ht 61.0 in | Wt 110.0 lb

## 2020-01-14 DIAGNOSIS — E063 Autoimmune thyroiditis: Secondary | ICD-10-CM

## 2020-01-14 LAB — T3, FREE: T3, Free: 3.1 pg/mL (ref 2.3–4.2)

## 2020-01-14 LAB — T4, FREE: Free T4: 0.91 ng/dL (ref 0.60–1.60)

## 2020-01-14 LAB — TSH: TSH: 4.58 u[IU]/mL — ABNORMAL HIGH (ref 0.35–4.50)

## 2020-01-14 NOTE — Progress Notes (Signed)
Patient ID: [name], female   DOB: Nov 12, 1960, 59 y.o.   MRN: [id number]   This visit occurred during the SARS-CoV-2 public health emergency.  Safety protocols were in place, including screening questions prior to the visit, additional usage of staff PPE, and extensive cleaning of exam room while observing appropriate contact time as indicated for disinfecting solutions.   HPI  [name] is a 59 y.o.-year-old female, referred by her PCP, Dr. Raoul Pitch, for management of Hashimoto's thyroiditis.  Pt. also has a history of recurrent urticaria: abdomen, back, groin, hands (started 2.5 years ago - last episode 6 mo ago) >> now only a small patch on 1 hand.  Pt. has been dx with Hashimoto's thyroiditis (without hypothyroidism) in 08/2019, during the investigation for hives.  She has not required levothyroxine yet.  She also has idiopathic recurrent pancreatitis, gastritis. She lost weight recently; ~15 lbs. She had extensive investigation and no cause was found.  However, in 11/2019, [name] was considered a possible culprit and then this was stopped.  No further episodes of pancreatitis since then.  I reviewed pt's thyroid tests: Lab Results  Component Value Date   TSH 2.56 10/21/2019   TSH 1.98 02/02/2018   TSH 1.086 08/22/2017   FREET4 0.82 10/21/2019   FREET4 0.78 02/02/2018    Patient's thyroid antibodies were elevated: Component     Latest Ref Rng & Units 09/24/2019  Thyroperoxidase Ab SerPl-aCnc     0 - 34 IU/mL 349 (H)  Thyroglobulin Antibody     0.0 - 0.9 IU/mL 52.8 (H)   Pt mentions: - weight loss (see above)  But denies: - fatigue - cold intolerance - depression - constipation - dry skin - hair loss  Pt denies feeling nodules in neck, hoarseness, dysphagia/odynophagia, SOB with lying down.  She has + FH of thyroid disorders in: M - postsurgical hypothyroidectomy. No FH of thyroid cancer. No FH of autoimmune ds. No h/o radiation tx to head or neck. Cousin  with celiac ds.  No recent use of iodine supplements. On iron, vitamin B12 and D.  She also has a history of ischemic cardiomyopathy, s/p AMI, PAF, history of stroke, history of pancreatitis.  She also has a history of angioedema (to Lopressor).    ROS: Constitutional: + See HPI Eyes: no blurry vision, no xerophthalmia ENT: no sore throat, + see HPI Cardiovascular: no CP/SOB/palpitations/leg swelling Respiratory: no cough/SOB Gastrointestinal: no N/+ V (not recently)/no D/C, + AP Musculoskeletal: + muscle/+ joint aches Skin: + Rash (hives), no hair loss Neurological: no tremors/numbness/tingling/dizziness Psychiatric: no depression/anxiety  Past Medical History:  Diagnosis Date  . Angio-edema   . Arthritis   . CAD (coronary artery disease)    a. s/p NSTEMI in 10/2017 with cath showing LM disease --> s/p CABG on 11/23/2017 with LIMA-LAD and SVG-OM.   [name] Epigastric pain    mid  . Hay fever   . Ischemic cardiomyopathy    a. EF 35-40% by echo in 10/2017  . NSTEMI (non-ST elevated myocardial infarction) ([place])   . PAF (paroxysmal atrial fibrillation) ([place])    Diagnosed by ILR  . Pancreatitis   . Pneumonia 11/16/2017  . Stroke [facility]) 08/23/2017   Left MCA infarct status post TPA and thrombectomy  . Urticaria    Past Surgical History:  Procedure Laterality Date  . BIOPSY  12/02/2019   Procedure: BIOPSY;  Surgeon: Rush Landmark Telford Nab., MD;  Location: [place];  Service: Gastroenterology;;  . CORONARY ARTERY BYPASS GRAFT N/A 11/23/2017  Procedure: CORONARY ARTERY BYPASS GRAFTING (CABG) x 2, ON PUMP, USING LEFT INTERNAL MAMMARY ARTERY AND RIGHT GREATER SAPHENOUS VEIN HARVESTED ENDOSCOPICALLY;  Surgeon: Gaye Pollack, MD;  Location: [place];  Service: Open Heart Surgery;  Laterality: N/A;  . CRYOABLATION     cervical  . ESOPHAGOGASTRODUODENOSCOPY (EGD) WITH PROPOFOL N/A 12/02/2019   Procedure: ESOPHAGOGASTRODUODENOSCOPY (EGD) WITH PROPOFOL;  Surgeon: Rush Landmark Telford Nab., MD;   Location: [place];  Service: Gastroenterology;  Laterality: N/A;  . FINE NEEDLE ASPIRATION  12/02/2019   Procedure: FINE NEEDLE ASPIRATION (FNA) LINEAR;  Surgeon: Irving Copas., MD;  Location: [place];  Service: Gastroenterology;;  . IR ANGIO INTRA EXTRACRAN SEL COM CAROTID INNOMINATE UNI R MOD SED  08/21/2017  . IR ANGIO VERTEBRAL SEL SUBCLAVIAN INNOMINATE UNI R MOD SED  08/21/2017  . IR PERCUTANEOUS ART THROMBECTOMY/INFUSION INTRACRANIAL INC DIAG ANGIO  08/21/2017  . IR RADIOLOGIST EVAL & MGMT  10/30/2017  . LOOP RECORDER INSERTION N/A 08/28/2017   Procedure: LOOP RECORDER INSERTION;  Surgeon: Constance Haw, MD;  Location: [place] CV LAB;  Service: Cardiovascular;  Laterality: N/A;  . RADIOLOGY WITH ANESTHESIA N/A 08/21/2017   Procedure: RADIOLOGY WITH ANESTHESIA;  Surgeon: Luanne Bras, MD;  Location: [place];  Service: Radiology;  Laterality: N/A;  . RIGHT/LEFT HEART CATH AND CORONARY ANGIOGRAPHY N/A 11/20/2017   Procedure: RIGHT/LEFT HEART CATH AND CORONARY ANGIOGRAPHY;  Surgeon: Jettie Booze, MD;  Location: [place] [facility];  Service: Cardiovascular;  Laterality: N/A;  . TEE WITHOUT CARDIOVERSION N/A 08/28/2017   Procedure: TRANSESOPHAGEAL ECHOCARDIOGRAM (TEE);  Surgeon: Fay Records, MD;  Location: [place];  Service: Cardiovascular;  Laterality: N/A;  . TEE WITHOUT CARDIOVERSION N/A 11/23/2017   Procedure: TRANSESOPHAGEAL ECHOCARDIOGRAM (TEE);  Surgeon: Gaye Pollack, MD;  Location: [place];  Service: Open Heart Surgery;  Laterality: N/A;  . TUBAL LIGATION    . UPPER ESOPHAGEAL ENDOSCOPIC ULTRASOUND (EUS) N/A 12/02/2019   Procedure: UPPER ESOPHAGEAL ENDOSCOPIC ULTRASOUND (EUS);  Surgeon: Irving Copas., MD;  Location: [place];  Service: Gastroenterology;  Laterality: N/A;   Social History   Socioeconomic History  . Marital status: Married    Spouse name: Not on file  . Number of children: 2  . Years of education: Not on file  .  Highest education level: Not on file  Occupational History  . Not on file  Tobacco Use  . Smoking status: Current Some Day Smoker    Packs/day: 0.10    Years: 37.00    Pack years: 3.70    Types: Cigarettes  . Smokeless tobacco: Never Used  Substance and Sexual Activity  . Alcohol use: No  . Drug use: No  . Sexual activity: Yes    Partners: Male  Other Topics Concern  . Not on file  Social History Narrative   Married. 2 children.    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    Walks with cane or wheelchair.    Former smoker.    Drinks caffeine.    Smoke alarm in the home.   Firearms in the home.    Wears her seatbelt.    Feels safe In her relationships.    Social Determinants of Health   Financial Resource Strain:   . Difficulty of Paying Living Expenses:   Food Insecurity:   . Worried About Charity fundraiser in the Last Year:   . Arboriculturist in the Last Year:   Transportation Needs:   . Film/video editor (Medical):   [name]  Lack of Transportation (Non-Medical):   Physical Activity:   . Days of Exercise per Week:   . Minutes of Exercise per Session:   Stress:   . Feeling of Stress :   Social Connections:   . Frequency of Communication with Friends and Family:   . Frequency of Social Gatherings with Friends and Family:   . Attends Religious Services:   . Active Member of Clubs or Organizations:   . Attends Archivist Meetings:   [name] Marital Status:   Intimate Partner Violence:   . Fear of Current or Ex-Partner:   . Emotionally Abused:   [name] Physically Abused:   . Sexually Abused:    Current Outpatient Medications on File Prior to Visit  Medication Sig Dispense Refill  . acetaminophen (TYLENOL) 325 MG tablet Take 2 tablets (650 mg total) by mouth every 6 (six) hours as needed for mild pain or moderate pain (or Fever >/= 101).    [name] azelastine (ASTELIN) 0.1 % nasal spray Place 1-2 sprays into both nostrils 2 (two) times daily as needed for rhinitis. Use in  each nostril as directed 30 mL 5  . baclofen (LIORESAL) 10 MG tablet Take 10 mg by mouth in the morning and at bedtime.    . calcium carbonate (TUMS - DOSED IN MG ELEMENTAL CALCIUM) 500 MG chewable tablet Chew 1 tablet by mouth 2 (two) times daily.     . carvedilol (COREG) 3.125 MG tablet Take 1 tablet by mouth twice daily (Patient taking differently: Take 3.125 mg by mouth in the morning and at bedtime. ) 180 tablet 1  . cholecalciferol (VITAMIN D) 1000 units tablet Take 1,000 Units by mouth daily.    . famotidine (PEPCID) 20 MG tablet Take 1 tablet (20 mg total) by mouth 2 (two) times daily. 180 tablet 1  . ferrous sulfate 325 (65 FE) MG tablet Take 325 mg by mouth 2 (two) times a week. Mondays  and Thursdays.    . hydrOXYzine (ATARAX/VISTARIL) 10 MG tablet Take 1 tablet (10 mg total) by mouth at bedtime. 90 tablet 1  . levocetirizine (XYZAL) 5 MG tablet Take 1 tablet (5 mg total) by mouth every evening. 180 tablet 1  . omeprazole (PRILOSEC) 40 MG capsule Take 1 capsule (40 mg total) by mouth daily. 30 capsule 3  . ondansetron (ZOFRAN ODT) 4 MG disintegrating tablet Take 1 tablet (4 mg total) by mouth every 8 (eight) hours as needed for nausea or vomiting. 30 tablet 0  . pravastatin (PRAVACHOL) 20 MG tablet Take 1 tablet (20 mg total) by mouth daily. (Patient taking differently: Take 20 mg by mouth every evening. ) 30 tablet 3  . rivaroxaban (XARELTO) 20 MG TABS tablet Take 1 tablet daily with evening meal 90 tablet 3  . traMADol (ULTRAM) 50 MG tablet Take 1-2 tablets (50-100 mg total) by mouth every 8 (eight) hours as needed for moderate pain. 180 tablet 1  . vitamin B-12 (CYANOCOBALAMIN) 100 MCG tablet Take 100 mcg by mouth daily.     No current facility-administered medications on file prior to visit.   Allergies  Allergen Reactions  . Lopressor [Metoprolol Tartrate] Swelling    Angioedema  . Allegra Allergy [Fexofenadine Hcl] Hives  . Dilaudid [Hydromorphone]     Hallucinations  .  Nsaids     On Xarelto  . Gabapentin (Once-Daily) Rash  . Lipitor [Atorvastatin] Diarrhea   Family History  Problem Relation Age of Onset  . Heart attack Father   . Hypertension  Mother   . Hyperlipidemia Mother   . Diabetes type II Mother   . Urticaria Mother   . Hypertension Brother   . Breast cancer Cousin 39  . Immunodeficiency Neg Hx   . Eczema Neg Hx   . Atopy Neg Hx   . Asthma Neg Hx   . Angioedema Neg Hx   . Allergic rhinitis Neg Hx   . Colon cancer Neg Hx   . Esophageal cancer Neg Hx   . Stomach cancer Neg Hx   . Pancreatic cancer Neg Hx   . Colon polyps Neg Hx   . Esophageal varices Neg Hx     PE: BP 118/60   Pulse 79   Ht 5\' 1"  (1.549 m)   Wt 110 lb (49.9 kg)   LMP  (LMP Unknown) Comment: at age 50  SpO2 94%   BMI 20.78 kg/m  Wt Readings from Last 3 Encounters:  01/14/20 110 lb (49.9 kg)  01/09/20 111 lb 6 oz (50.5 kg)  12/06/19 114 lb (51.7 kg)   Constitutional: Thin, in NAD Eyes: PERRLA, EOMI, no exophthalmos ENT: moist mucous membranes, no thyromegaly but palpable symmetric thyroid, no cervical lymphadenopathy Cardiovascular: RRR, No MRG Respiratory: CTA B Gastrointestinal: abdomen soft, NT, ND, BS+ Musculoskeletal: no deformities, strength intact in all 4 Skin: moist, warm, no rashes Neurological: no tremor with outstretched hands, DTR normal in all 4  ASSESSMENT: 1. Hashimoto thyroiditis  PLAN: 1. Hashimoto thyroiditis - I reviewed the results of her thyroid function tests and thyroid antibodies along with the patient.  I explained that elevated antibodies are indicative of Hashimoto's thyroiditis.   - Pt does not have a thyroid cancer family history or a personal history of RxTx to head/neck, so is not at high risk for [facility]. - we had a long discussion about her Hashimoto thyroiditis diagnosis. I explained that this is an autoimmune disorder, in which she develops antibodies against her own thyroid. The antibodies bind to the thyroid tissue  and cause inflammation, and, eventually, destruction of the gland and hypothyroidism. We don't know how long this process can be, it can last from months to years. As of now, based on the last results that I have, her thyroid tests are normal. We will repeat them today, however. I will also add thyroid antibody levels. - I do not feel the goiter on palpation but her thyroid is palpable.  This may be due to inflammation due to thyroid antibodies. - We discussed about treatment for Hashimoto thyroiditis, which is actually limited to thyroid hormones in case her TFTs are abnormal. Supplements like selenium has been tried with various results, some showing improvement in the TPO antibodies. However, there are no randomized controlled trials of this are consistent results between trials. We also discussed about ways to improve her immune system (relaxation, diet, exercise, sleep) to reduce the Ab titer and, subsequently, the thyroid inflammation.  She has gone through significant, repeated, physical stress, being admitted with pancreatitis every month in the last 4 months. -We discussed that occasionally treatment with low-dose thyroid hormones may be useful in preventing sites recurrences.  However, for now, we decided to start selenium in case bodies are still elevated and check her antibodies again in approximately 2 months if she is responding to the selenium and she does not have any more hives episodes, we can continue on the supplement.  If she does have more hives episodes, we can try 25 mg of levothyroxine with close follow-up of her TFTs. -  I advised pt to join my chart and I will send her the results through there, but she prefers to be called with the results.  Component     Latest Ref Rng & Units 01/14/2020  TSH     0.35 - 4.50 uIU/mL 4.58 (H)  T4,Free(Direct)     0.60 - 1.60 ng/dL 0.91  Triiodothyronine,Free,Serum     2.3 - 4.2 pg/mL 3.1  Thyroperoxidase Ab SerPl-aCnc     <9 IU/mL 661 (H)   Thyroglobulin Ab     < or = 1 IU/mL 39 (H)   TFTs now show mild subclinical hypothyroidism with increased antithyroid antibodies.  We will go ahead with the plan to start selenium and check in 1.5 months.  If TSH is still elevated at that time, will start levothyroxine.  Philemon Kingdom, MD PhD [facility] Endocrinology

## 2020-01-14 NOTE — Patient Instructions (Addendum)
Please stop at the lab.  We may need to start Selenium after the results are back.  Please come back for a follow-up appointment in 6 months.

## 2020-01-15 LAB — THYROID PEROXIDASE ANTIBODY: Thyroperoxidase Ab SerPl-aCnc: 661 IU/mL — ABNORMAL HIGH (ref <9)

## 2020-01-15 LAB — THYROGLOBULIN ANTIBODY: Thyroglobulin Ab: 39 IU/mL — ABNORMAL HIGH (ref <=1)

## 2020-01-23 LAB — CUP PACEART REMOTE DEVICE CHECK
Date Time Interrogation Session: 20210428232045
Implantable Pulse Generator Implant Date: 20181203

## 2020-01-24 ENCOUNTER — Encounter: Payer: Self-pay | Admitting: Gastroenterology

## 2020-01-24 ENCOUNTER — Encounter: Payer: Self-pay | Admitting: Cardiology

## 2020-01-24 ENCOUNTER — Other Ambulatory Visit: Payer: Self-pay

## 2020-01-24 ENCOUNTER — Ambulatory Visit (INDEPENDENT_AMBULATORY_CARE_PROVIDER_SITE_OTHER): Payer: 59 | Admitting: Cardiology

## 2020-01-24 ENCOUNTER — Ambulatory Visit (INDEPENDENT_AMBULATORY_CARE_PROVIDER_SITE_OTHER): Payer: 59 | Admitting: Gastroenterology

## 2020-01-24 VITALS — BP 118/66 | HR 94 | Temp 98.1°F | Ht 61.0 in | Wt 110.0 lb

## 2020-01-24 VITALS — BP 98/60 | HR 80 | Temp 97.7°F | Ht 61.0 in | Wt 110.0 lb

## 2020-01-24 DIAGNOSIS — K859 Acute pancreatitis without necrosis or infection, unspecified: Secondary | ICD-10-CM | POA: Diagnosis not present

## 2020-01-24 DIAGNOSIS — R198 Other specified symptoms and signs involving the digestive system and abdomen: Secondary | ICD-10-CM

## 2020-01-24 DIAGNOSIS — K828 Other specified diseases of gallbladder: Secondary | ICD-10-CM | POA: Diagnosis not present

## 2020-01-24 DIAGNOSIS — I255 Ischemic cardiomyopathy: Secondary | ICD-10-CM | POA: Diagnosis not present

## 2020-01-24 DIAGNOSIS — R897 Abnormal histological findings in specimens from other organs, systems and tissues: Secondary | ICD-10-CM | POA: Diagnosis not present

## 2020-01-24 DIAGNOSIS — E782 Mixed hyperlipidemia: Secondary | ICD-10-CM

## 2020-01-24 DIAGNOSIS — I480 Paroxysmal atrial fibrillation: Secondary | ICD-10-CM

## 2020-01-24 DIAGNOSIS — I25119 Atherosclerotic heart disease of native coronary artery with unspecified angina pectoris: Secondary | ICD-10-CM

## 2020-01-24 NOTE — Patient Instructions (Signed)
Medication Instructions: Your physician recommends that you continue on your current medications as directed. Please refer to the Current Medication list given to you today.   Labwork: Fasting lipids   Procedures/Testing: Your physician has requested that you have an echocardiogram. Echocardiography is a painless test that uses sound waves to create images of your heart. It provides your doctor with information about the size and shape of your heart and how well your heart's chambers and valves are working. This procedure takes approximately one hour. There are no restrictions for this procedure.    Follow-Up: 4 months office visit with B.Strader, PA-C  Any Additional Special Instructions Will Be Listed Below (If Applicable).     If you need a refill on your cardiac medications before your next appointment, please call your pharmacy.

## 2020-01-24 NOTE — Progress Notes (Signed)
[place] VISIT   Primary Care Provider [name], [place] [address] [place]  [id number] [phone]  Referring Provider Dr. Bryan Lemma  Patient Profile: [name] is a 59 y.o. female with a pmh significant for status post CVA, atrial fibrillation (on anticoagulation), CAD status post CABG, ischemic cardiomyopathy, recurrent idiopathic acute pancreatitis.  The patient presents to the [facility] for an evaluation and management of problem(s) noted below:  Problem List 1. Idiopathic recurrent acute pancreatitis   2. Abnormality present on pathology (Atypical Pathology)   3. Gallbladder sludge   4. Abnormal findings on endoscopic ultrasound (EUS)     History of Present Illness Please see prior progress notes by Dr. Bryan Lemma for full details of HPI.  Interval History Since the patient's last visit with Dr. Bryan Lemma approximately 2 weeks ago, the patient has continued to do well since her last hospitalization and discharge.  She is now almost 2 months since her hospitalization and she and her husband state that she is doing better than she has in a while.  She lost a significant amount of weight during the periods of her pancreatitis but things are stabilizing out and she is only approximately 5 pounds below where she normally is about a year ago.  She is not having any significant nausea or vomiting.  She is watching what she eats in regards to fat intake but overall is eating what she wants.  She denies any significant nausea or vomiting.  GI Review of Systems Positive as above Negative for pyrosis, dysphagia, odynophagia, change in bowel habits  Review of Systems General: Denies fevers/chills HEENT: Denies oral lesions Cardiovascular: Denies chest pain Pulmonary: Denies shortness of breath Gastroenterological: See HPI Genitourinary: Denies darkened urine Hematological: Positive for easy bruising/bleeding due to  anticoagulation Dermatological: Denies jaundice Psychological: Mood is stable   Medications Current Outpatient Medications  Medication Sig Dispense Refill  . acetaminophen (TYLENOL) 325 MG tablet Take 2 tablets (650 mg total) by mouth every 6 (six) hours as needed for mild pain or moderate pain (or Fever >/= 101).    [name] azelastine (ASTELIN) 0.1 % nasal spray Place 1-2 sprays into both nostrils 2 (two) times daily as needed for rhinitis. Use in each nostril as directed 30 mL 5  . baclofen (LIORESAL) 10 MG tablet Take 10 mg by mouth in the morning and at bedtime.    . calcium carbonate (TUMS - DOSED IN MG ELEMENTAL CALCIUM) 500 MG chewable tablet Chew 1 tablet by mouth 2 (two) times daily.     . carvedilol (COREG) 3.125 MG tablet Take 1 tablet by mouth twice daily (Patient taking differently: Take 3.125 mg by mouth in the morning and at bedtime. ) 180 tablet 1  . cholecalciferol (VITAMIN D) 1000 units tablet Take 1,000 Units by mouth daily.    . famotidine (PEPCID) 20 MG tablet Take 1 tablet (20 mg total) by mouth 2 (two) times daily. 180 tablet 1  . ferrous sulfate 325 (65 FE) MG tablet Take 325 mg by mouth 2 (two) times a week. Mondays  and Thursdays.    . hydrOXYzine (ATARAX/VISTARIL) 10 MG tablet Take 1 tablet (10 mg total) by mouth at bedtime. 90 tablet 1  . levocetirizine (XYZAL) 5 MG tablet Take 1 tablet (5 mg total) by mouth every evening. 180 tablet 1  . omeprazole (PRILOSEC) 40 MG capsule Take 1 capsule (40 mg total) by mouth daily. 30 capsule 3  . ondansetron (ZOFRAN ODT) 4 MG  disintegrating tablet Take 1 tablet (4 mg total) by mouth every 8 (eight) hours as needed for nausea or vomiting. 30 tablet 0  . pravastatin (PRAVACHOL) 20 MG tablet Take 1 tablet (20 mg total) by mouth daily. (Patient taking differently: Take 20 mg by mouth every evening. ) 30 tablet 3  . rivaroxaban (XARELTO) 20 MG TABS tablet Take 1 tablet daily with evening meal 90 tablet 3  . traMADol (ULTRAM) 50 MG tablet  Take 1-2 tablets (50-100 mg total) by mouth every 8 (eight) hours as needed for moderate pain. 180 tablet 1  . vitamin B-12 (CYANOCOBALAMIN) 100 MCG tablet Take 100 mcg by mouth daily.     No current facility-administered medications for this visit.    Allergies Allergies  Allergen Reactions  . Lopressor [Metoprolol Tartrate] Swelling    Angioedema  . Allegra Allergy [Fexofenadine Hcl] Hives  . Dilaudid [Hydromorphone]     Hallucinations  . Nsaids     On Xarelto  . Gabapentin (Once-Daily) Rash  . Lipitor [Atorvastatin] Diarrhea    Histories Past Medical History:  Diagnosis Date  . Angio-edema   . Arthritis   . CAD (coronary artery disease)    a. s/p NSTEMI in 10/2017 with cath showing LM disease --> s/p CABG on 11/23/2017 with LIMA-LAD and SVG-OM.   [name] Hay fever   . Ischemic cardiomyopathy    a. EF 35-40% by echo in 10/2017  . NSTEMI (non-ST elevated myocardial infarction) ([place])   . PAF (paroxysmal atrial fibrillation) ([place])    Diagnosed by ILR  . Pancreatitis   . Pneumonia 11/16/2017  . Stroke [facility]) 08/23/2017   Left MCA infarct status post TPA and thrombectomy  . Urticaria    Past Surgical History:  Procedure Laterality Date  . BIOPSY  12/02/2019   Procedure: BIOPSY;  Surgeon: Rush Landmark Telford Nab., MD;  Location: [place];  Service: Gastroenterology;;  . CORONARY ARTERY BYPASS GRAFT N/A 11/23/2017   Procedure: CORONARY ARTERY BYPASS GRAFTING (CABG) x 2, ON PUMP, USING LEFT INTERNAL MAMMARY ARTERY AND RIGHT GREATER SAPHENOUS VEIN HARVESTED ENDOSCOPICALLY;  Surgeon: Gaye Pollack, MD;  Location: [place];  Service: Open Heart Surgery;  Laterality: N/A;  . CRYOABLATION     cervical  . ESOPHAGOGASTRODUODENOSCOPY (EGD) WITH PROPOFOL N/A 12/02/2019   Procedure: ESOPHAGOGASTRODUODENOSCOPY (EGD) WITH PROPOFOL;  Surgeon: Rush Landmark Telford Nab., MD;  Location: [place];  Service: Gastroenterology;  Laterality: N/A;  . FINE NEEDLE ASPIRATION  12/02/2019   Procedure: FINE  NEEDLE ASPIRATION (FNA) LINEAR;  Surgeon: Irving Copas., MD;  Location: [place];  Service: Gastroenterology;;  . IR ANGIO INTRA EXTRACRAN SEL COM CAROTID INNOMINATE UNI R MOD SED  08/21/2017  . IR ANGIO VERTEBRAL SEL SUBCLAVIAN INNOMINATE UNI R MOD SED  08/21/2017  . IR PERCUTANEOUS ART THROMBECTOMY/INFUSION INTRACRANIAL INC DIAG ANGIO  08/21/2017  . IR RADIOLOGIST EVAL & MGMT  10/30/2017  . LOOP RECORDER INSERTION N/A 08/28/2017   Procedure: LOOP RECORDER INSERTION;  Surgeon: Constance Haw, MD;  Location: [place] CV [facility];  Service: Cardiovascular;  Laterality: N/A;  . RADIOLOGY WITH ANESTHESIA N/A 08/21/2017   Procedure: RADIOLOGY WITH ANESTHESIA;  Surgeon: Luanne Bras, MD;  Location: [place];  Service: Radiology;  Laterality: N/A;  . RIGHT/LEFT HEART CATH AND CORONARY ANGIOGRAPHY N/A 11/20/2017   Procedure: RIGHT/LEFT HEART CATH AND CORONARY ANGIOGRAPHY;  Surgeon: Jettie Booze, MD;  Location: [place] [facility];  Service: Cardiovascular;  Laterality: N/A;  . TEE WITHOUT CARDIOVERSION N/A 08/28/2017   Procedure: TRANSESOPHAGEAL ECHOCARDIOGRAM (  TEE);  Surgeon: Fay Records, MD;  Location: [place];  Service: Cardiovascular;  Laterality: N/A;  . TEE WITHOUT CARDIOVERSION N/A 11/23/2017   Procedure: TRANSESOPHAGEAL ECHOCARDIOGRAM (TEE);  Surgeon: Gaye Pollack, MD;  Location: [place];  Service: Open Heart Surgery;  Laterality: N/A;  . TUBAL LIGATION    . UPPER ESOPHAGEAL ENDOSCOPIC ULTRASOUND (EUS) N/A 12/02/2019   Procedure: UPPER ESOPHAGEAL ENDOSCOPIC ULTRASOUND (EUS);  Surgeon: Irving Copas., MD;  Location: [place];  Service: Gastroenterology;  Laterality: N/A;   Social History   Socioeconomic History  . Marital status: Married    Spouse name: Not on file  . Number of children: 2  . Years of education: Not on file  . Highest education level: Not on file  Occupational History  . Not on file  Tobacco Use  . Smoking status: Current Some  Day Smoker    Packs/day: 0.10    Years: 37.00    Pack years: 3.70    Types: Cigarettes  . Smokeless tobacco: Never Used  Substance and Sexual Activity  . Alcohol use: No  . Drug use: No  . Sexual activity: Yes    Partners: Male  Other Topics Concern  . Not on file  Social History Narrative   Married. 2 children.    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    Walks with cane or wheelchair.    Former smoker.    Drinks caffeine.    Smoke alarm in the home.   Firearms in the home.    Wears her seatbelt.    Feels safe In her relationships.    Social Determinants of Health   Financial Resource Strain:   . Difficulty of Paying Living Expenses:   Food Insecurity:   . Worried About Charity fundraiser in the Last Year:   . Arboriculturist in the Last Year:   Transportation Needs:   . Film/video editor (Medical):   [name] Lack of Transportation (Non-Medical):   Physical Activity:   . Days of Exercise per Week:   . Minutes of Exercise per Session:   Stress:   . Feeling of Stress :   Social Connections:   . Frequency of Communication with Friends and Family:   . Frequency of Social Gatherings with Friends and Family:   . Attends Religious Services:   . Active Member of Clubs or Organizations:   . Attends Archivist Meetings:   [name] Marital Status:   Intimate Partner Violence:   . Fear of Current or Ex-Partner:   . Emotionally Abused:   [name] Physically Abused:   . Sexually Abused:    Family History  Problem Relation Age of Onset  . Heart attack Father   . Hypertension Mother   . Hyperlipidemia Mother   . Diabetes type II Mother   . Urticaria Mother   . Hypertension Brother   . Breast cancer Cousin 81  . Immunodeficiency Neg Hx   . Eczema Neg Hx   . Atopy Neg Hx   . Asthma Neg Hx   . Angioedema Neg Hx   . Allergic rhinitis Neg Hx   . Colon cancer Neg Hx   . Esophageal cancer Neg Hx   . Stomach cancer Neg Hx   . Pancreatic cancer Neg Hx   . Colon polyps Neg Hx   .  Esophageal varices Neg Hx   . Inflammatory bowel disease Neg Hx   . Rectal cancer Neg Hx    I have reviewed  her medical, social, and family history in detail and updated the electronic medical record as necessary.    PHYSICAL EXAMINATION  BP 98/60   Pulse 80   Temp 97.7 F (36.5 C)   Ht 5\' 1"  (1.549 m)   Wt 110 lb (49.9 kg)   LMP  (LMP Unknown) Comment: at age 48  BMI 20.78 kg/m  Wt Readings from Last 3 Encounters:  01/24/20 110 lb (49.9 kg)  01/24/20 110 lb (49.9 kg)  01/14/20 110 lb (49.9 kg)  GEN: NAD, appears stated age, doesn't appear chronically ill, accompanied by husband PSYCH: Cooperative, without pressured speech EYE: Conjunctivae pink, sclerae anicteric ENT: MMM CV: Nontachycardic RESP: No audible wheezing GI: NABS, soft, NT/ND, without rebound or guarding MSK/EXT: Trace bilateral lower extremity edema SKIN: No jaundice NEURO:  Alert & Oriented x 3, no focal deficits   REVIEW OF DATA  I reviewed the following data at the time of this encounter:  GI Procedures and Studies  March 2021 EUS EGD Impression: - No gross lesions in esophagus. - Z-line irregular, 39 cm from the incisors. - Erythematous mucosa in the gastric body, incisura and antrum. Biopsied. - Mucosal changes in the duodenum. Biopsied. - Normal ampulla. EUS Impression: - An area of hypoechogenicity was identified in the pancreatic head. Fine needle biopsy performed to exclude malignancy. - A cystic lesion was seen in the peripancreatic region - seemed to be coming from the duodenal wall. Fine needle aspiration for fluid performed. Query groove pancreatitis. - There was no sign of significant pathology in the common bile duct and in the common hepatic duct. - Hyperechoic material consistent with sludge was visualized endosonographically in the gallbladder but no stones. - No malignant-appearing lymph nodes were visualized in the celiac region (level 20), peripancreatic region and porta hepatis  region. Pathology FINAL MICROSCOPIC DIAGNOSIS:  A. PANCREAS, HEAD, FINE NEEDLE ASPIRATION:  - Atypical cells present.  - See comment.  COMMENT:  There are numerous benign glandular cells and occasional fragments with slight nuclear enlargement. A neoplastic process cannot be ruled out.   Laboratory Studies  Reviewed those in epic and care everywhere  Imaging Studies  November 29, 2019 CT abdomen pelvis IMPRESSION: The previously seen inflammation/stranding around the pancreatic head and uncinate process has decreased. Inflammation in the area now appears to be centered around the 1st and 2nd portions of the duodenum with associated duodenal wall thickening. Findings concerning for duodenitis. Essentially stable pancreatic head pseudocyst. Left colonic diverticulosis. No active diverticulitis. Irregular aortic atherosclerosis with altered plaque and aneurysmal dilatation, 3.1 cm maximally.   ASSESSMENT  Ms. Rhynes is a 59 y.o. female with a pmh significant for status post CVA, atrial fibrillation (on anticoagulation), CAD status post CABG, ischemic cardiomyopathy, recurrent idiopathic acute pancreatitis.  The patient is seen today for evaluation and management of:  1. Idiopathic recurrent acute pancreatitis   2. Abnormality present on pathology (Atypical Pathology)   3. Gallbladder sludge   4. Abnormal findings on endoscopic ultrasound (EUS)    The patient is currently hemodynamically and clinically stable.  She has done exceedingly well over the course of the newly last 2 months since her hospitalization.  During her last hospitalization, I performed an upper endoscopic ultrasound with biopsy of an area of hypoechogenicity.  This returned showing atypical cells but was most consistent with likely an inflammatory process as she had come in with acute pancreatitis once again just 3 days prior to her EUS.  At this point, the patient is doing  very well.  Before repeat upper endoscopic  evaluation is performed to ensure that we are not missing any sort of other malignant process I think a dedicated pancreas protocol CT will help guide to see how things are at this time.  We will decide endoscopic ultrasound need based on how the pancreas looks at that time.  Patient during her endoscopic ultrasound was found to have gallbladder sludge without cholelithiasis.  Certainly the gallbladder can be considered in regards to resection in the setting of patient to have idiopathic pancreatitis and may have some benefit for decreasing recurrent episodes.  We also discussed the role of potential quaternary referral to [facility] for pancreatic manometry if the patient continued to have episodes of recurrent idiopathic pancreatitis to rule out sphincter of Oddi dysfunction of the pancreatic orifice.  All patient questions were answered, to the best of my ability, and the patient agrees to the aforementioned plan of action with follow-up as indicated.   PLAN  Proceed with scheduling pancreas protocol to CT abdomen with contrast Repeat endoscopic ultrasound will be decided based on the findings on pancreas protocol CT In future consider role of cholecystectomy with setting of gallbladder sludge Consider genetics testing per primary GI should patient have recurrent pancreatitis May consider quaternary care referral to [facility] for manometry for pancreatic SOD evaluation but we need to see how the pancreas looks before that Continue to monitor fat intake closely and try to stay on a heart healthy low-fat diet   Orders Placed This Encounter  Procedures  . CT ABDOMEN W CONTRAST    New Prescriptions   No medications on file   Modified Medications   No medications on file    Planned Follow Up No follow-ups on file.   Total Time in Face-to-Face and in Coordination of Care for patient including independent/personal interpretation/review of prior testing, medical history, examination, medication  adjustment, communicating results with the patient directly, and documentation with the EHR is 30 minutes.   Justice Britain, MD [facility] # [id number]

## 2020-01-24 NOTE — Progress Notes (Signed)
Cardiology Office Note  Date: 01/24/2020   ID: [name], DOB 1961-08-24, MRN [id number]  PCP:  [name], DO  Cardiologist:  [name], MD Electrophysiologist:  Constance Haw, MD   Chief Complaint  Patient presents with  . Cardiac follow-up    History of Present Illness: [name] is a 59 y.o. female last assessed via telehealth evaluation by [name] PA-C in January.  She presents for a routine visit.  I note that she had follow-up earlier today with GI regarding interval pancreatitis.  She states that this has been stable this month, no abdominal pain, steady weight and appetite.  As of her last visit she was continued on Coreg with resumption of statin therapy as well as Entresto.  She had been taken off Lasix previously given concern for potential association with pancreatitis.  She had to stop the [facility], states that it was giving her a potential allergic reaction, had whelps on her skin and possibly mild angioedema.  At the present time her cardiac regimen includes Coreg, Pravachol, and Xarelto.  I did talk with her about getting a follow-up echocardiogram to ensure no decline in LVEF compared to last assessment in November 2020.  This will also help to guide further medical therapy adjustments.  Office visit noted with Dr. Curt Bears in January as well, I reviewed the note.  ILR interrogation over the last few months does not show any atrial arrhythmias.  Past Medical History:  Diagnosis Date  . Angio-edema   . Arthritis   . CAD (coronary artery disease)    a. s/p NSTEMI in 10/2017 with cath showing LM disease --> s/p CABG on 11/23/2017 with LIMA-LAD and SVG-OM.   [name] Hay fever   . Ischemic cardiomyopathy    a. EF 35-40% by echo in 10/2017  . NSTEMI (non-ST elevated myocardial infarction) ([place])   . PAF (paroxysmal atrial fibrillation) ([place])    Diagnosed by ILR  . Pancreatitis   . Pneumonia 11/16/2017  . Stroke [facility]) 08/23/2017   Left  MCA infarct status post TPA and thrombectomy  . Urticaria     Past Surgical History:  Procedure Laterality Date  . BIOPSY  12/02/2019   Procedure: BIOPSY;  Surgeon: Rush Landmark Telford Nab., MD;  Location: [place];  Service: Gastroenterology;;  . CORONARY ARTERY BYPASS GRAFT N/A 11/23/2017   Procedure: CORONARY ARTERY BYPASS GRAFTING (CABG) x 2, ON PUMP, USING LEFT INTERNAL MAMMARY ARTERY AND RIGHT GREATER SAPHENOUS VEIN HARVESTED ENDOSCOPICALLY;  Surgeon: Gaye Pollack, MD;  Location: [place];  Service: Open Heart Surgery;  Laterality: N/A;  . CRYOABLATION     cervical  . ESOPHAGOGASTRODUODENOSCOPY (EGD) WITH PROPOFOL N/A 12/02/2019   Procedure: ESOPHAGOGASTRODUODENOSCOPY (EGD) WITH PROPOFOL;  Surgeon: Rush Landmark Telford Nab., MD;  Location: [place];  Service: Gastroenterology;  Laterality: N/A;  . FINE NEEDLE ASPIRATION  12/02/2019   Procedure: FINE NEEDLE ASPIRATION (FNA) LINEAR;  Surgeon: Irving Copas., MD;  Location: [place];  Service: Gastroenterology;;  . IR ANGIO INTRA EXTRACRAN SEL COM CAROTID INNOMINATE UNI R MOD SED  08/21/2017  . IR ANGIO VERTEBRAL SEL SUBCLAVIAN INNOMINATE UNI R MOD SED  08/21/2017  . IR PERCUTANEOUS ART THROMBECTOMY/INFUSION INTRACRANIAL INC DIAG ANGIO  08/21/2017  . IR RADIOLOGIST EVAL & MGMT  10/30/2017  . LOOP RECORDER INSERTION N/A 08/28/2017   Procedure: LOOP RECORDER INSERTION;  Surgeon: Constance Haw, MD;  Location: [place] [facility];  Service: Cardiovascular;  Laterality: N/A;  . RADIOLOGY WITH ANESTHESIA  N/A 08/21/2017   Procedure: RADIOLOGY WITH ANESTHESIA;  Surgeon: Luanne Bras, MD;  Location: [place];  Service: Radiology;  Laterality: N/A;  . RIGHT/LEFT HEART CATH AND CORONARY ANGIOGRAPHY N/A 11/20/2017   Procedure: RIGHT/LEFT HEART CATH AND CORONARY ANGIOGRAPHY;  Surgeon: Jettie Booze, MD;  Location: [place] [facility];  Service: Cardiovascular;  Laterality: N/A;  . TEE WITHOUT CARDIOVERSION N/A 08/28/2017    Procedure: TRANSESOPHAGEAL ECHOCARDIOGRAM (TEE);  Surgeon: Fay Records, MD;  Location: [place];  Service: Cardiovascular;  Laterality: N/A;  . TEE WITHOUT CARDIOVERSION N/A 11/23/2017   Procedure: TRANSESOPHAGEAL ECHOCARDIOGRAM (TEE);  Surgeon: Gaye Pollack, MD;  Location: [place];  Service: Open Heart Surgery;  Laterality: N/A;  . TUBAL LIGATION    . UPPER ESOPHAGEAL ENDOSCOPIC ULTRASOUND (EUS) N/A 12/02/2019   Procedure: UPPER ESOPHAGEAL ENDOSCOPIC ULTRASOUND (EUS);  Surgeon: Irving Copas., MD;  Location: [place];  Service: Gastroenterology;  Laterality: N/A;    Current Outpatient Medications  Medication Sig Dispense Refill  . acetaminophen (TYLENOL) 325 MG tablet Take 2 tablets (650 mg total) by mouth every 6 (six) hours as needed for mild pain or moderate pain (or Fever >/= 101).    [name] azelastine (ASTELIN) 0.1 % nasal spray Place 1-2 sprays into both nostrils 2 (two) times daily as needed for rhinitis. Use in each nostril as directed 30 mL 5  . baclofen (LIORESAL) 10 MG tablet Take 10 mg by mouth in the morning and at bedtime.    . calcium carbonate (TUMS - DOSED IN MG ELEMENTAL CALCIUM) 500 MG chewable tablet Chew 1 tablet by mouth 2 (two) times daily.     . carvedilol (COREG) 3.125 MG tablet Take 1 tablet by mouth twice daily (Patient taking differently: Take 3.125 mg by mouth in the morning and at bedtime. ) 180 tablet 1  . cholecalciferol (VITAMIN D) 1000 units tablet Take 1,000 Units by mouth daily.    . famotidine (PEPCID) 20 MG tablet Take 1 tablet (20 mg total) by mouth 2 (two) times daily. 180 tablet 1  . ferrous sulfate 325 (65 FE) MG tablet Take 325 mg by mouth 2 (two) times a week. Mondays  and Thursdays.    . hydrOXYzine (ATARAX/VISTARIL) 10 MG tablet Take 1 tablet (10 mg total) by mouth at bedtime. 90 tablet 1  . levocetirizine (XYZAL) 5 MG tablet Take 1 tablet (5 mg total) by mouth every evening. 180 tablet 1  . omeprazole (PRILOSEC) 40 MG capsule Take 1  capsule (40 mg total) by mouth daily. 30 capsule 3  . ondansetron (ZOFRAN ODT) 4 MG disintegrating tablet Take 1 tablet (4 mg total) by mouth every 8 (eight) hours as needed for nausea or vomiting. 30 tablet 0  . pravastatin (PRAVACHOL) 20 MG tablet Take 1 tablet (20 mg total) by mouth daily. (Patient taking differently: Take 20 mg by mouth every evening. ) 30 tablet 3  . rivaroxaban (XARELTO) 20 MG TABS tablet Take 1 tablet daily with evening meal 90 tablet 3  . traMADol (ULTRAM) 50 MG tablet Take 1-2 tablets (50-100 mg total) by mouth every 8 (eight) hours as needed for moderate pain. 180 tablet 1  . vitamin B-12 (CYANOCOBALAMIN) 100 MCG tablet Take 100 mcg by mouth daily.     No current facility-administered medications for this visit.   Allergies:  Lopressor [metoprolol tartrate], Allegra allergy [fexofenadine hcl], Dilaudid [hydromorphone], Nsaids, Gabapentin (once-daily), and Lipitor [atorvastatin]   ROS:  No palpitations.  Physical Exam: VS:  BP 118/66  Pulse 94   Temp 98.1 F (36.7 C)   Ht 5\' 1"  (1.549 m)   Wt 110 lb (49.9 kg)   LMP  (LMP Unknown) Comment: at age 44  SpO2 93%   BMI 20.78 kg/m , BMI Body mass index is 20.78 kg/m.  Wt Readings from Last 3 Encounters:  01/24/20 110 lb (49.9 kg)  01/24/20 110 lb (49.9 kg)  01/14/20 110 lb (49.9 kg)    General: Patient appears comfortable at rest. HEENT: Conjunctiva and lids normal, wearing a mask. Neck: Supple, no elevated JVP or carotid bruits, no thyromegaly. Lungs: Clear to auscultation, nonlabored breathing at rest. Cardiac: Regular rate and rhythm, no S3 or significant systolic murmur, no pericardial rub. Abdomen: Soft, bowel sounds present. Extremities: No pitting edema, distal pulses 2+.  ECG:  An ECG dated 11/29/2019 was personally reviewed today and demonstrated:  Sinus rhythm with PVC, left atrial enlargement and nonspecific T wave changes.  Recent Labwork: 12/03/2019: ALT 13; AST 21 12/04/2019: BUN 9; Creatinine,  Ser 0.52; Hemoglobin 13.6; Magnesium 1.8; Platelets 309; Potassium 3.4; Sodium 138 01/14/2020: TSH 4.58     Component Value Date/Time   CHOL 203 (H) 09/11/2019 0453   TRIG 85 09/11/2019 0453   HDL 28 (L) 09/11/2019 0453   CHOLHDL 7.3 09/11/2019 0453   VLDL 17 09/11/2019 0453   LDLCALC 158 (H) 09/11/2019 0453    Other Studies Reviewed Today:  Echocardiogram 08/01/2019: 1. Left ventricular ejection fraction, by visual estimation, is 50 to  55%. The left ventricle has low normal function. There is no left  ventricular hypertrophy.  2. Left ventricular diastolic parameters are consistent with Grade I  diastolic dysfunction (impaired relaxation).  3. Hypokinesis/ akinesis of the basal inferior/inferior walls.  4. Global right ventricle has normal systolic function.The right  ventricular size is normal. No increase in right ventricular wall  thickness.  5. Left atrial size was normal.  6. Right atrial size was normal.  7. The mitral valve is normal in structure. No evidence of mitral valve  regurgitation.  8. The tricuspid valve is normal in structure. Tricuspid valve  regurgitation is not demonstrated.  9. The aortic valve is tricuspid. Aortic valve regurgitation is not  visualized. Mild to moderate aortic valve sclerosis/calcification without  any evidence of aortic stenosis.  10. The pulmonic valve was not well visualized. Pulmonic valve  regurgitation is not visualized.   Assessment and Plan:  1.  Multivessel CAD status post CABG in February 2019.  For any active angina at this time, currently not on aspirin given use of Xarelto.  She is on Coreg as well as Pravachol, tolerating both.  2.  Mixed hyperlipidemia, tolerating Pravachol.  Check FLP.  3.  History of ischemic cardiomyopathy, LVEF improved to the range of 50 to 55% as of November 2020.  Medications have been significantly adjusted since that time however.  Now only on Coreg.  Recheck echocardiogram to help guide  further medical therapy adjustments.  Would not try Entresto again given concerns about allergic reaction, also using Lasix only on an as-needed basis at this time for mild ankle edema.  4.  Paroxysmal atrial fibrillation, asymptomatic at this time.  She continues on Xarelto for stroke prophylaxis.  Recurring events based on ILR interrogation.  Medication Adjustments/Labs and Tests Ordered: Current medicines are reviewed at length with the patient today.  Concerns regarding medicines are outlined above.   Tests Ordered: Orders Placed This Encounter  Procedures  . Lipid Profile  . ECHOCARDIOGRAM COMPLETE  Medication Changes: No orders of the defined types were placed in this encounter.   Disposition:  Follow up 4 months with [place] in the [place] office.  Signed, Satira Sark, MD, [facility] 01/24/2020 3:47 PM    [facility] at [facility] [address] [place], [place] [address] Phone: [phone]; Fax: [phone]

## 2020-01-24 NOTE — Patient Instructions (Addendum)
You have been scheduled for a CT scan of the abdomen  at [place] are scheduled on 01/31/2020 at 3:30pm. You should arrive 15 minutes prior to your appointment time for registration. Please follow the written instructions below on the day of your exam:  WARNING: IF YOU ARE ALLERGIC TO IODINE/X-RAY DYE, PLEASE NOTIFY RADIOLOGY IMMEDIATELY AT [phone]! YOU WILL BE GIVEN A 13 HOUR PREMEDICATION PREP.  1) Do not eat or drink anything after 11:30AM (4 hours prior to your test) 2) You have been given 2 bottles of oral contrast to drink. The solution may taste better if refrigerated, but do NOT add ice or any other liquid to this solution. Shake well before drinking.     Drink 1 bottle of contrast @ 2:30pm (1 hour prior to your exam)  You may take any medications as prescribed with a small amount of water, if necessary. If you take any of the following medications: METFORMIN, GLUCOPHAGE, GLUCOVANCE, AVANDAMET, RIOMET, FORTAMET, [place] MET, JANUMET, GLUMETZA or METAGLIP, you MAY be asked to HOLD this medication 48 hours AFTER the exam.  The purpose of you drinking the oral contrast is to aid in the visualization of your intestinal tract. The contrast solution may cause some diarrhea. Depending on your individual set of symptoms, you may also receive an intravenous injection of x-ray contrast/dye. Plan on being at [facility] for 30 minutes or longer, depending on the type of exam you are having performed.  This test typically takes 30-45 minutes to complete.  If you have any questions regarding your exam or if you need to reschedule, you may call the CT department at [phone] between the hours of 8:00 am and 5:00 pm, Monday-Friday.  ________________________________________________________________________   I appreciate the opportunity to care for you. Justice Britain, MD

## 2020-01-25 ENCOUNTER — Encounter: Payer: Self-pay | Admitting: Gastroenterology

## 2020-01-25 DIAGNOSIS — R198 Other specified symptoms and signs involving the digestive system and abdomen: Secondary | ICD-10-CM

## 2020-01-25 DIAGNOSIS — K828 Other specified diseases of gallbladder: Secondary | ICD-10-CM

## 2020-01-25 DIAGNOSIS — R897 Abnormal histological findings in specimens from other organs, systems and tissues: Secondary | ICD-10-CM

## 2020-01-25 DIAGNOSIS — K859 Acute pancreatitis without necrosis or infection, unspecified: Secondary | ICD-10-CM | POA: Insufficient documentation

## 2020-01-25 HISTORY — DX: Acute pancreatitis without necrosis or infection, unspecified: K85.90

## 2020-01-25 HISTORY — DX: Other specified diseases of gallbladder: K82.8

## 2020-01-25 HISTORY — DX: Other specified symptoms and signs involving the digestive system and abdomen: R19.8

## 2020-01-25 HISTORY — DX: Abnormal histological findings in specimens from other organs, systems and tissues: R89.7

## 2020-01-27 ENCOUNTER — Telehealth: Payer: Self-pay

## 2020-01-27 ENCOUNTER — Ambulatory Visit (INDEPENDENT_AMBULATORY_CARE_PROVIDER_SITE_OTHER): Payer: 59 | Admitting: *Deleted

## 2020-01-27 DIAGNOSIS — I63512 Cerebral infarction due to unspecified occlusion or stenosis of left middle cerebral artery: Secondary | ICD-10-CM | POA: Diagnosis not present

## 2020-01-27 NOTE — Telephone Encounter (Signed)
-----   Message from Irving Copas., MD sent at 01/27/2020  2:20 AM EDT ----- Regarding: Followup Renly Roots,Can you ensure that the CT scan that was ordered is a pancreas protocol CT abdomen?Thanks.GM

## 2020-01-27 NOTE — Telephone Encounter (Signed)
Confirmed that the CT is pancreatic protocol.

## 2020-01-27 NOTE — Telephone Encounter (Signed)
Great. Thanks for the follow up. GM

## 2020-01-27 NOTE — Progress Notes (Signed)
[facility] Summary Report / Loop Recorder 

## 2020-01-28 ENCOUNTER — Other Ambulatory Visit: Payer: Self-pay

## 2020-01-28 ENCOUNTER — Ambulatory Visit (HOSPITAL_COMMUNITY)
Admission: RE | Admit: 2020-01-28 | Discharge: 2020-01-28 | Disposition: A | Discharge status: Home / Self Care | Payer: 59 | Source: Ambulatory Visit | Attending: Cardiology | Admitting: Cardiology

## 2020-01-28 DIAGNOSIS — I255 Ischemic cardiomyopathy: Secondary | ICD-10-CM

## 2020-01-28 NOTE — Progress Notes (Signed)
*  PRELIMINARY RESULTS* Echocardiogram 2D Echocardiogram has been performed.  [name] 01/28/2020, 12:27 PM

## 2020-01-30 ENCOUNTER — Other Ambulatory Visit: Payer: Self-pay | Admitting: Family Medicine

## 2020-01-30 DIAGNOSIS — L509 Urticaria, unspecified: Secondary | ICD-10-CM

## 2020-01-31 ENCOUNTER — Other Ambulatory Visit: Payer: Self-pay

## 2020-01-31 ENCOUNTER — Ambulatory Visit (HOSPITAL_COMMUNITY)
Admission: RE | Admit: 2020-01-31 | Discharge: 2020-01-31 | Disposition: A | Discharge status: Home / Self Care | Payer: 59 | Source: Ambulatory Visit | Attending: Gastroenterology | Admitting: Gastroenterology

## 2020-01-31 DIAGNOSIS — K859 Acute pancreatitis without necrosis or infection, unspecified: Secondary | ICD-10-CM | POA: Diagnosis not present

## 2020-01-31 DIAGNOSIS — K828 Other specified diseases of gallbladder: Secondary | ICD-10-CM | POA: Diagnosis present

## 2020-01-31 MED ORDER — IOHEXOL 300 MG/ML  SOLN
100.0000 mL | Freq: Once | INTRAMUSCULAR | Status: AC | PRN
  Administered 2020-01-31: 80 mL via INTRAVENOUS

## 2020-01-31 MED ORDER — SODIUM CHLORIDE (PF) 0.9 % IJ SOLN
INTRAMUSCULAR | Status: AC
  Filled 2020-01-31: qty 50

## 2020-02-10 ENCOUNTER — Other Ambulatory Visit: Payer: Self-pay | Admitting: Family Medicine

## 2020-02-10 DIAGNOSIS — L509 Urticaria, unspecified: Secondary | ICD-10-CM

## 2020-02-11 ENCOUNTER — Ambulatory Visit: Payer: 59 | Attending: Internal Medicine

## 2020-02-11 DIAGNOSIS — Z23 Encounter for immunization: Secondary | ICD-10-CM

## 2020-02-11 NOTE — Progress Notes (Signed)
   Covid-19 Vaccination Clinic  Name:  [name]    MRN: [id number] DOB: Jun 28, 1961  02/11/2020  Ms. [name] was observed post Covid-19 immunization for 15 minutes without incident. She was provided with Vaccine Information Sheet and instruction to access the V-Safe system.   Ms. [name] was instructed to call 911 with any severe reactions post vaccine: [name] Difficulty breathing  . Swelling of face and throat  . A fast heartbeat  . A bad rash all over body  . Dizziness and weakness   Immunizations Administered    Name Date Dose VIS Date Route   Moderna COVID-19 Vaccine 02/11/2020 12:05 PM 0.5 mL 08/2019 Intramuscular   Manufacturer: Moderna   Lot: [id number]   [id number]

## 2020-02-17 ENCOUNTER — Other Ambulatory Visit: Payer: Self-pay | Admitting: Student

## 2020-02-17 ENCOUNTER — Other Ambulatory Visit: Payer: Self-pay | Admitting: Family Medicine

## 2020-02-17 DIAGNOSIS — L509 Urticaria, unspecified: Secondary | ICD-10-CM

## 2020-02-18 ENCOUNTER — Encounter: Payer: Self-pay | Admitting: Family Medicine

## 2020-02-18 ENCOUNTER — Ambulatory Visit (INDEPENDENT_AMBULATORY_CARE_PROVIDER_SITE_OTHER): Payer: 59 | Admitting: Family Medicine

## 2020-02-18 ENCOUNTER — Other Ambulatory Visit: Payer: Self-pay

## 2020-02-18 VITALS — BP 122/73 | HR 85 | Temp 98.2°F | Resp 18 | Ht 61.0 in | Wt 109.2 lb

## 2020-02-18 DIAGNOSIS — R310 Gross hematuria: Secondary | ICD-10-CM | POA: Diagnosis not present

## 2020-02-18 DIAGNOSIS — N200 Calculus of kidney: Secondary | ICD-10-CM | POA: Insufficient documentation

## 2020-02-18 HISTORY — DX: Calculus of kidney: N20.0

## 2020-02-18 LAB — BASIC METABOLIC PANEL
BUN: 14 mg/dL (ref 6–23)
CO2: 32 mEq/L (ref 19–32)
Calcium: 9.8 mg/dL (ref 8.4–10.5)
Chloride: 103 mEq/L (ref 96–112)
Creatinine, Ser: 0.59 mg/dL (ref 0.40–1.20)
GFR: 104.04 mL/min (ref >60.00)
Glucose, Bld: 85 mg/dL (ref 70–99)
Potassium: 4.8 mEq/L (ref 3.5–5.1)
Sodium: 140 mEq/L (ref 135–145)

## 2020-02-18 LAB — CBC WITH DIFFERENTIAL/PLATELET
Basophils Absolute: 0 10*3/uL (ref 0.0–0.1)
Basophils Relative: 0.5 % (ref 0.0–3.0)
Eosinophils Absolute: 0.2 10*3/uL (ref 0.0–0.7)
Eosinophils Relative: 2.7 % (ref 0.0–5.0)
HCT: 43.5 % (ref 36.0–46.0)
Hemoglobin: 14.5 g/dL (ref 12.0–15.0)
Lymphocytes Relative: 24.8 % (ref 12.0–46.0)
Lymphs Abs: 2.1 10*3/uL (ref 0.7–4.0)
MCHC: 33.4 g/dL (ref 30.0–36.0)
MCV: 96.7 fl (ref 78.0–100.0)
Monocytes Absolute: 0.9 10*3/uL (ref 0.1–1.0)
Monocytes Relative: 10.4 % (ref 3.0–12.0)
Neutro Abs: 5.3 10*3/uL (ref 1.4–7.7)
Neutrophils Relative %: 61.6 % (ref 43.0–77.0)
Platelets: 361 10*3/uL (ref 150.0–400.0)
RBC: 4.5 Mil/uL (ref 3.87–5.11)
RDW: 13.2 % (ref 11.5–15.5)
WBC: 8.6 10*3/uL (ref 4.0–10.5)

## 2020-02-18 LAB — POCT URINALYSIS DIPSTICK
Bilirubin, UA: NEGATIVE
Glucose, UA: NEGATIVE
Ketones, UA: NEGATIVE
Nitrite, UA: NEGATIVE
Protein, UA: POSITIVE — AB
Spec Grav, UA: 1.015 (ref 1.010–1.025)
Urobilinogen, UA: 0.2 E.U./dL
pH, UA: 7 (ref 5.0–8.0)

## 2020-02-18 MED ORDER — CEPHALEXIN 500 MG PO CAPS: 500.0000 mg | ORAL_CAPSULE | Freq: Four times a day (QID) | ORAL | 0 refills | Status: DC

## 2020-02-18 NOTE — Patient Instructions (Signed)
Start keflex, it is every 6 hours. Antibiotic.  Have CT completed at [facility], [place], [place] [address]> they will call you to schedule.    I have referred you to a urologist to further evaluate your stone and the blood in your urine.    Kidney Stones Kidney stones are rock-like masses that form inside of the kidneys. Kidneys are organs that make pee (urine). A kidney stone may move into other parts of the urinary tract, including:  The tubes that connect the kidneys to the bladder (ureters).  The bladder.  The tube that carries urine out of the body (urethra). Kidney stones can cause very bad pain and can block the flow of pee. The stone usually leaves your body (passes) through your pee. You may need to have a doctor take out the stone. What are the causes? Kidney stones may be caused by:  A condition in which certain glands make too much parathyroid hormone (primary hyperparathyroidism).  A buildup of a type of crystals in the bladder made of a chemical called uric acid. The body makes uric acid when you eat certain foods.  Narrowing (stricture) of one or both of the ureters.  A kidney blockage that you were born with.  Past surgery on the kidney or the ureters, such as gastric bypass surgery. What increases the risk? You are more likely to develop this condition if:  You have had a kidney stone in the past.  You have a family history of kidney stones.  You do not drink enough water.  You eat a diet that is high in protein, salt (sodium), or sugar.  You are overweight or very overweight (obese). What are the signs or symptoms? Symptoms of a kidney stone may include:  Pain in the side of the belly, right below the ribs (flank pain). Pain usually spreads (radiates) to the groin.  Needing to pee often or right away (urgently).  Pain when going pee (urinating).  Blood in your pee (hematuria).  Feeling like you may vomit  (nauseous).  Vomiting.  Fever and chills. How is this treated? Treatment depends on the size, location, and makeup of the kidney stones. The stones will often pass out of the body through peeing. You may need to:  Drink more fluid to help pass the stone. In some cases, you may be given fluids through an IV tube put into one of your veins at the hospital.  Take medicine for pain.  Make changes in your diet to help keep kidney stones from coming back. Sometimes, medical procedures are needed to remove a kidney stone. This may involve:  A procedure to break up kidney stones using a beam of light (laser) or shock waves.  Surgery to remove the kidney stones. Follow these instructions at home: Medicines  Take over-the-counter and prescription medicines only as told by your doctor.  Ask your doctor if the medicine prescribed to you requires you to avoid driving or using heavy machinery. Eating and drinking  Drink enough fluid to keep your pee pale yellow. You may be told to drink at least 8-10 glasses of water each day. This will help you pass the stone.  If told by your doctor, change your diet. This may include: ? Limiting how much salt you eat. ? Eating more fruits and vegetables. ? Limiting how much meat, poultry, fish, and eggs you eat.  Follow instructions from your doctor about eating or drinking restrictions. General instructions  Collect pee samples as  told by your doctor. You may need to collect a pee sample: ? 24 hours after a stone comes out. ? 8-12 weeks after a stone comes out, and every 6-12 months after that.  Strain your pee every time you pee (urinate), for as long as told. Use the strainer that your doctor recommends.  Do not throw out the stone. Keep it so that it can be tested by your doctor.  Keep all follow-up visits as told by your doctor. This is important. You may need follow-up tests. How is this prevented? To prevent another kidney stone:  Drink  enough fluid to keep your pee pale yellow. This is the best way to prevent kidney stones.  Eat healthy foods.  Avoid certain foods as told by your doctor. You may be told to eat less protein.  Stay at a healthy weight. Where to find more information  [place] [facility]): www.kidney.[facility]): www.urologyhealth.org Contact a doctor if:  You have pain that gets worse or does not get better with medicine. Get help right away if:  You have a fever or chills.  You get very bad pain.  You get new pain in your belly (abdomen).  You pass out (faint).  You cannot pee. Summary  Kidney stones are rock-like masses that form inside of the kidneys.  Kidney stones can cause very bad pain and can block the flow of pee.  The stones will often pass out of the body through peeing.  Drink enough fluid to keep your pee pale yellow. This information is not intended to replace advice given to you by your health care provider. Make sure you discuss any questions you have with your health care provider. Document Revised: 01/29/2019 Document Reviewed: 01/29/2019 Elsevier Patient Education  2020 [employer].    Dietary Guidelines to Help Prevent Kidney Stones Kidney stones are deposits of minerals and salts that form inside your kidneys. Your risk of developing kidney stones may be greater depending on your diet, your lifestyle, the medicines you take, and whether you have certain medical conditions. Most people can reduce their chances of developing kidney stones by following the instructions below. Depending on your overall health and the type of kidney stones you tend to develop, your dietitian may give you more specific instructions. What are tips for following this plan? Reading food labels  Choose foods with "no salt added" or "low-salt" labels. Limit your sodium intake to less than 1500 mg per day.  Choose foods with calcium for each meal and snack.  Try to eat about 300 mg of calcium at each meal. Foods that contain 200-500 mg of calcium per serving include: ? 8 oz (237 ml) of milk, fortified nondairy milk, and fortified fruit juice. ? 8 oz (237 ml) of kefir, yogurt, and soy yogurt. ? 4 oz (118 ml) of tofu. ? 1 oz of cheese. ? 1 cup (300 g) of dried figs. ? 1 cup (91 g) of cooked broccoli. ? 1-3 oz can of sardines or mackerel.  Most people need 1000 to 1500 mg of calcium each day. Talk to your dietitian about how much calcium is recommended for you. Shopping  Buy plenty of fresh fruits and vegetables. Most people do not need to avoid fruits and vegetables, even if they contain nutrients that may contribute to kidney stones.  When shopping for convenience foods, choose: ? Whole pieces of fruit. ? Premade salads with dressing on the side. ? Low-fat fruit and yogurt  smoothies.  Avoid buying frozen meals or prepared deli foods.  Look for foods with live cultures, such as yogurt and kefir. Cooking  Do not add salt to food when cooking. Place a salt shaker on the table and allow each person to add his or her own salt to taste.  Use vegetable protein, such as beans, textured vegetable protein (TVP), or tofu instead of meat in pasta, casseroles, and soups. Meal planning   Eat less salt, if told by your dietitian. To do this: ? Avoid eating processed or premade food. ? Avoid eating fast food.  Eat less animal protein, including cheese, meat, poultry, or fish, if told by your dietitian. To do this: ? Limit the number of times you have meat, poultry, fish, or cheese each week. Eat a diet free of meat at least 2 days a week. ? Eat only one serving each day of meat, poultry, fish, or seafood. ? When you prepare animal protein, cut pieces into small portion sizes. For most meat and fish, one serving is about the size of one deck of cards.  Eat at least 5 servings of fresh fruits and vegetables each day. To do this: ? Keep fruits and  vegetables on hand for snacks. ? Eat 1 piece of fruit or a handful of berries with breakfast. ? Have a salad and fruit at lunch. ? Have two kinds of vegetables at dinner.  Limit foods that are high in a substance called oxalate. These include: ? Spinach. ? Rhubarb. ? Beets. ? Potato chips and french fries. ? Nuts.  If you regularly take a diuretic medicine, make sure to eat at least 1-2 fruits or vegetables high in potassium each day. These include: ? Avocado. ? Banana. ? Orange, prune, carrot, or tomato juice. ? Baked potato. ? Cabbage. ? Beans and split peas. General instructions   Drink enough fluid to keep your urine clear or pale yellow. This is the most important thing you can do.  Talk to your health care provider and dietitian about taking daily supplements. Depending on your health and the cause of your kidney stones, you may be advised: ? Not to take supplements with vitamin C. ? To take a calcium supplement. ? To take a daily probiotic supplement. ? To take other supplements such as magnesium, fish oil, or vitamin B6.  Take all medicines and supplements as told by your health care provider.  Limit alcohol intake to no more than 1 drink a day for nonpregnant women and 2 drinks a day for men. One drink equals 12 oz of beer, 5 oz of wine, or 1 oz of hard liquor.  Lose weight if told by your health care provider. Work with your dietitian to find strategies and an eating plan that works best for you. What foods are not recommended? Limit your intake of the following foods, or as told by your dietitian. Talk to your dietitian about specific foods you should avoid based on the type of kidney stones and your overall health. Grains Breads. Bagels. Rolls. Baked goods. Salted crackers. Cereal. Pasta. Vegetables Spinach. Rhubarb. Beets. Canned vegetables. Angie Fava. Olives. Meats and other protein foods Nuts. Nut butters. Large portions of meat, poultry, or fish. Salted or  cured meats. Deli meats. Hot dogs. Sausages. Dairy Cheese. Beverages Regular soft drinks. Regular vegetable juice. Seasonings and other foods Seasoning blends with salt. Salad dressings. Canned soups. Soy sauce. Ketchup. Barbecue sauce. Canned pasta sauce. Casseroles. Pizza. Lasagna. Frozen meals. Potato chips. [place] fries. Summary  You can reduce your risk of kidney stones by making changes to your diet.  The most important thing you can do is drink enough fluid. You should drink enough fluid to keep your urine clear or pale yellow.  Ask your health care provider or dietitian how much protein from animal sources you should eat each day, and also how much salt and calcium you should have each day. This information is not intended to replace advice given to you by your health care provider. Make sure you discuss any questions you have with your health care provider. Document Revised: 01/02/2019 Document Reviewed: 08/23/2016 Elsevier Patient Education  2020 [employer].

## 2020-02-18 NOTE — Progress Notes (Signed)
This visit occurred during the SARS-CoV-2 public health emergency.  Safety protocols were in place, including screening questions prior to the visit, additional usage of staff PPE, and extensive cleaning of exam room while observing appropriate contact time as indicated for disinfecting solutions.    [name] , 03/19/1961, 58 y.o., female MRN: [id number] Patient Care Team    Relationship Specialty Notifications Start End  [name], DO PCP - General Family Medicine  10/13/17   Constance Haw, MD PCP - Electrophysiology Cardiology Admissions 08/28/17   Satira Sark, MD PCP - Cardiology Cardiology Admissions 12/15/17   Rosalin Hawking, MD Consulting Physician Neurology  10/13/17   Jamse Arn, MD Consulting Physician Physical Medicine and Rehabilitation  10/13/17   Luanne Bras, MD Consulting Physician Interventional Radiology  10/13/17     Chief Complaint  Patient presents with  . Hematuria    Started Sunday evening. No pain, no frequency, no fever, No new meds.      Subjective: Pt presents for an OV with complaints of gross hematuria of 3 days duration present with every urination. She denies flank pain, abd pain, dysuria, urinary frequency, nausea or fever. Per review of CT results in the system she has had kidney stones bilateral kidneys. With evidence of kidney stone migration around Feb/march down right ureter. Kidney stone is reported as 5-6 mm. Pt reports Sunday she had a bad episode of diarrhea, and then she started noticing blood in her urine. She has been admitted for episodes of pancreatitis with pain, nausea and vomit or the last year with CT abd in system.   Depression screen [facility] 2/9 11/22/2018 10/22/2018 10/22/2018 10/19/2018  Decreased Interest 0 0 0 0  Down, Depressed, Hopeless 0 0 0 0  PHQ - 2 Score 0 0 0 0  Altered sleeping - 0 - -  Tired, decreased energy - 1 - -  Change in appetite - 0 - -  Feeling bad or failure about yourself  - 0 - -    Trouble concentrating - 0 - -  Moving slowly or fidgety/restless - 0 - -  Suicidal thoughts - 0 - -  PHQ-9 Score - 1 - -  Difficult doing work/chores - Not difficult at all - -  Some recent data might be hidden    Allergies  Allergen Reactions  . Lopressor [Metoprolol Tartrate] Swelling    Angioedema  . Allegra Allergy [Fexofenadine Hcl] Hives  . Dilaudid [Hydromorphone]     Hallucinations  . Nsaids     On Xarelto  . Gabapentin (Once-Daily) Rash  . Lipitor [Atorvastatin] Diarrhea   Social History   Social History Narrative   Married. 2 children.    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    Walks with cane or wheelchair.    Former smoker.    Drinks caffeine.    Smoke alarm in the home.   Firearms in the home.    Wears her seatbelt.    Feels safe In her relationships.    Past Medical History:  Diagnosis Date  . Angio-edema   . Arthritis   . CAD (coronary artery disease)    a. s/p NSTEMI in 10/2017 with cath showing LM disease --> s/p CABG on 11/23/2017 with LIMA-LAD and SVG-OM.   [name] Hay fever   . Ischemic cardiomyopathy    a. EF 35-40% by echo in 10/2017  . NSTEMI (non-ST elevated myocardial infarction) ([place])   . PAF (paroxysmal atrial fibrillation) ([place])  Diagnosed by ILR  . Pancreatitis   . Pneumonia 11/16/2017  . Stroke [facility]) 08/23/2017   Left MCA infarct status post TPA and thrombectomy  . Urticaria    Past Surgical History:  Procedure Laterality Date  . BIOPSY  12/02/2019   Procedure: BIOPSY;  Surgeon: Rush Landmark Telford Nab., MD;  Location: [place];  Service: Gastroenterology;;  . CORONARY ARTERY BYPASS GRAFT N/A 11/23/2017   Procedure: CORONARY ARTERY BYPASS GRAFTING (CABG) x 2, ON PUMP, USING LEFT INTERNAL MAMMARY ARTERY AND RIGHT GREATER SAPHENOUS VEIN HARVESTED ENDOSCOPICALLY;  Surgeon: Gaye Pollack, MD;  Location: [place];  Service: Open Heart Surgery;  Laterality: N/A;  . CRYOABLATION     cervical  . ESOPHAGOGASTRODUODENOSCOPY (EGD) WITH PROPOFOL  N/A 12/02/2019   Procedure: ESOPHAGOGASTRODUODENOSCOPY (EGD) WITH PROPOFOL;  Surgeon: Rush Landmark Telford Nab., MD;  Location: [place];  Service: Gastroenterology;  Laterality: N/A;  . FINE NEEDLE ASPIRATION  12/02/2019   Procedure: FINE NEEDLE ASPIRATION (FNA) LINEAR;  Surgeon: Irving Copas., MD;  Location: [place];  Service: Gastroenterology;;  . IR ANGIO INTRA EXTRACRAN SEL COM CAROTID INNOMINATE UNI R MOD SED  08/21/2017  . IR ANGIO VERTEBRAL SEL SUBCLAVIAN INNOMINATE UNI R MOD SED  08/21/2017  . IR PERCUTANEOUS ART THROMBECTOMY/INFUSION INTRACRANIAL INC DIAG ANGIO  08/21/2017  . IR RADIOLOGIST EVAL & MGMT  10/30/2017  . LOOP RECORDER INSERTION N/A 08/28/2017   Procedure: LOOP RECORDER INSERTION;  Surgeon: Constance Haw, MD;  Location: [place] CV LAB;  Service: Cardiovascular;  Laterality: N/A;  . RADIOLOGY WITH ANESTHESIA N/A 08/21/2017   Procedure: RADIOLOGY WITH ANESTHESIA;  Surgeon: Luanne Bras, MD;  Location: [place];  Service: Radiology;  Laterality: N/A;  . RIGHT/LEFT HEART CATH AND CORONARY ANGIOGRAPHY N/A 11/20/2017   Procedure: RIGHT/LEFT HEART CATH AND CORONARY ANGIOGRAPHY;  Surgeon: Jettie Booze, MD;  Location: [place] CV LAB;  Service: Cardiovascular;  Laterality: N/A;  . TEE WITHOUT CARDIOVERSION N/A 08/28/2017   Procedure: TRANSESOPHAGEAL ECHOCARDIOGRAM (TEE);  Surgeon: Fay Records, MD;  Location: [place];  Service: Cardiovascular;  Laterality: N/A;  . TEE WITHOUT CARDIOVERSION N/A 11/23/2017   Procedure: TRANSESOPHAGEAL ECHOCARDIOGRAM (TEE);  Surgeon: Gaye Pollack, MD;  Location: [place];  Service: Open Heart Surgery;  Laterality: N/A;  . TUBAL LIGATION    . UPPER ESOPHAGEAL ENDOSCOPIC ULTRASOUND (EUS) N/A 12/02/2019   Procedure: UPPER ESOPHAGEAL ENDOSCOPIC ULTRASOUND (EUS);  Surgeon: Irving Copas., MD;  Location: [place];  Service: Gastroenterology;  Laterality: N/A;   Family History  Problem Relation Age of Onset   . Heart attack Father   . Hypertension Mother   . Hyperlipidemia Mother   . Diabetes type II Mother   . Urticaria Mother   . Hypertension Brother   . Breast cancer Cousin 52  . Immunodeficiency Neg Hx   . Eczema Neg Hx   . Atopy Neg Hx   . Asthma Neg Hx   . Angioedema Neg Hx   . Allergic rhinitis Neg Hx   . Colon cancer Neg Hx   . Esophageal cancer Neg Hx   . Stomach cancer Neg Hx   . Pancreatic cancer Neg Hx   . Colon polyps Neg Hx   . Esophageal varices Neg Hx   . Inflammatory bowel disease Neg Hx   . Rectal cancer Neg Hx    Allergies as of 02/18/2020      Reactions   Lopressor [metoprolol Tartrate] Swelling   Angioedema   Allegra Allergy [fexofenadine Hcl] Hives   Dilaudid [  hydromorphone]    Hallucinations   Nsaids    On Xarelto   Gabapentin (once-daily) Rash   Lipitor [atorvastatin] Diarrhea      Medication List       Accurate as of Feb 18, 2020 11:13 AM. If you have any questions, ask your nurse or doctor.        acetaminophen 325 MG tablet Commonly known as: TYLENOL Take 2 tablets (650 mg total) by mouth every 6 (six) hours as needed for mild pain or moderate pain (or Fever >/= 101).   azelastine 0.1 % nasal spray Commonly known as: ASTELIN Place 1-2 sprays into both nostrils 2 (two) times daily as needed for rhinitis. Use in each nostril as directed   baclofen 10 MG tablet Commonly known as: LIORESAL Take 10 mg by mouth in the morning and at bedtime.   calcium carbonate 500 MG chewable tablet Commonly known as: TUMS - dosed in mg elemental calcium Chew 1 tablet by mouth 2 (two) times daily.   carvedilol 3.125 MG tablet Commonly known as: COREG Take 1 tablet by mouth twice daily What changed: when to take this   cholecalciferol 1000 units tablet Commonly known as: VITAMIN D Take 1,000 Units by mouth daily.   famotidine 20 MG tablet Commonly known as: Pepcid Take 1 tablet (20 mg total) by mouth 2 (two) times daily.   ferrous sulfate 325 (65  FE) MG tablet Take 325 mg by mouth 2 (two) times a week. Mondays  and Thursdays.   hydrOXYzine 10 MG tablet Commonly known as: ATARAX/VISTARIL Take 1 tablet (10 mg total) by mouth at bedtime.   levocetirizine 5 MG tablet Commonly known as: XYZAL Take 1 tablet (5 mg total) by mouth every evening.   omeprazole 40 MG capsule Commonly known as: PRILOSEC Take 1 capsule (40 mg total) by mouth daily.   ondansetron 4 MG disintegrating tablet Commonly known as: Zofran ODT Take 1 tablet (4 mg total) by mouth every 8 (eight) hours as needed for nausea or vomiting.   pravastatin 20 MG tablet Commonly known as: PRAVACHOL Take 1 tablet (20 mg total) by mouth every evening.   rivaroxaban 20 MG Tabs tablet Commonly known as: Xarelto Take 1 tablet daily with evening meal   traMADol 50 MG tablet Commonly known as: ULTRAM Take 1-2 tablets (50-100 mg total) by mouth every 8 (eight) hours as needed for moderate pain.   vitamin B-12 100 MCG tablet Commonly known as: CYANOCOBALAMIN Take 100 mcg by mouth daily.       All past medical history, surgical history, allergies, family history, immunizations andmedications were updated in the EMR today and reviewed under the history and medication portions of their EMR.     ROS: Negative, with the exception of above mentioned in HPI   Objective:  BP 122/73 (BP Location: Left Arm, Patient Position: Sitting, Cuff Size: Normal)   Pulse 85   Temp 98.2 F (36.8 C) (Temporal)   Resp 18   Ht 5\' 1"  (1.549 m)   Wt 109 lb 4 oz (49.6 kg)   LMP  (LMP Unknown) Comment: at age 58  SpO2 95%   BMI 20.64 kg/m  Body mass index is 20.64 kg/m. Gen: Afebrile. No acute distress. Nontoxic in appearance, well developed, well nourished.  HENT: AT. [place]. Eyes:Pupils Equal Round Reactive to light, Extraocular movements intact,  Conjunctiva without redness, discharge or icterus. CV: RRR.  Abd: Soft. flat. NTND. BS present. no Masses palpated. No rebound or guarding.  MSK: no CVA tenderness bilateral.  Skin: no rashes, purpura or petechiae.  Neuro: Normal gait. PERLA. EOMi. Alert. Oriented x3 Psych: Normal affect, dress and demeanor. Normal speech. Normal thought content and judgment.  No exam data present No results found. Results for orders placed or performed in visit on 02/18/20 (from the past 24 hour(s))  POCT urinalysis dipstick     Status: Abnormal   Collection Time: 02/18/20 11:06 AM  Result Value Ref Range   Color, UA red    Clarity, UA cloudy    Glucose, UA Negative Negative   Bilirubin, UA negative    Ketones, UA negative    Spec Grav, UA 1.015 1.010 - 1.025   Blood, UA 3+    pH, UA 7.0 5.0 - 8.0   Protein, UA Positive (A) Negative   Urobilinogen, UA 0.2 0.2 or 1.0 E.U./dL   Nitrite, UA negative    Leukocytes, UA 4+ (A) Negative   Appearance     Odor      Assessment/Plan: [name] is a 59 y.o. female present for OV for  Gross hematuria/kidney stones Reviewed CT 01/31/2020 [facility] by another provider, which resulted with 5 mm kidney stone within right ureter, it was present on 11/29/2019 CT as well. CT 11/07/2019 resulted with nephrolithiasis within kidneys, but ureters were clear.  Migration of stone is possible cause of her hematuria. She had significant  hematuria in February as well- when she was admitted for pancreatitis through the ED.  Pain: oddly she is not in much pain today. She is prescribed tramadol for her chronic pain associated with her strokes, which may be masking pain by stone.  - she was also a smoker and at higher risk for bladder malignancy. Therefore will refer to urology for further eval. Cover her for UTI while we await cultures and repeat her CT to locate right ureter kidney stone and if migrating or passed as cause of hematuria. If stone still present can consider flomax start while getting her into urology.  - POCT urinalysis dipstick - Urinalysis w microscopic + reflex cultur - Ambulatory  referral to Urology - cephALEXin (KEFLEX) 500 MG capsule; Take 1 capsule (500 mg total) by mouth 4 (four) times daily.  Dispense: 28 capsule; Refill: 0 - CT RENAL STONE STUDY; Future - CBC w/Diff - Basic Metabolic Panel (BMET)  Reviewed expectations re: course of current medical issues.  Discussed self-management of symptoms.  Outlined signs and symptoms indicating need for more acute intervention.  Patient verbalized understanding and all questions were answered.  Patient received an After-Visit Summary.   Orders Placed This Encounter  Procedures  . POCT urinalysis dipstick   No orders of the defined types were placed in this encounter.  Referral Orders  No referral(s) requested today     Note is dictated utilizing voice recognition software. Although note has been proof read prior to signing, occasional typographical errors still can be missed. If any questions arise, please do not hesitate to call for verification.   electronically signed by:  Howard Pouch, DO  [place]

## 2020-02-20 LAB — URINE CULTURE
MICRO NUMBER:: 10521851
SPECIMEN QUALITY:: ADEQUATE

## 2020-02-20 LAB — URINALYSIS W MICROSCOPIC + REFLEX CULTURE
Bacteria, UA: NONE SEEN /HPF
Bilirubin Urine: NEGATIVE
Glucose, UA: NEGATIVE
Hyaline Cast: NONE SEEN /LPF
Ketones, ur: NEGATIVE
Nitrites, Initial: NEGATIVE
Specific Gravity, Urine: 1.011 (ref 1.001–1.03)
pH: 7 (ref 5.0–8.0)

## 2020-02-20 LAB — CULTURE INDICATED

## 2020-02-25 ENCOUNTER — Ambulatory Visit (INDEPENDENT_AMBULATORY_CARE_PROVIDER_SITE_OTHER): Payer: 59 | Admitting: *Deleted

## 2020-02-25 DIAGNOSIS — I63512 Cerebral infarction due to unspecified occlusion or stenosis of left middle cerebral artery: Secondary | ICD-10-CM

## 2020-02-25 LAB — CUP PACEART REMOTE DEVICE CHECK
Date Time Interrogation Session: 20210529233421
Implantable Pulse Generator Implant Date: 20181203

## 2020-02-26 NOTE — Progress Notes (Signed)
[facility] Summary Report / Loop Recorder 

## 2020-03-02 ENCOUNTER — Other Ambulatory Visit: Payer: Self-pay

## 2020-03-02 ENCOUNTER — Ambulatory Visit (INDEPENDENT_AMBULATORY_CARE_PROVIDER_SITE_OTHER): Payer: 59

## 2020-03-02 DIAGNOSIS — R310 Gross hematuria: Secondary | ICD-10-CM

## 2020-03-02 DIAGNOSIS — N200 Calculus of kidney: Secondary | ICD-10-CM | POA: Diagnosis not present

## 2020-03-02 NOTE — Addendum Note (Signed)
Addended by: Douglass Rivers D on: 03/02/2020 11:58 AM   Modules accepted: Level of Service

## 2020-03-03 ENCOUNTER — Telehealth: Payer: Self-pay | Admitting: Family Medicine

## 2020-03-03 ENCOUNTER — Other Ambulatory Visit: Payer: Self-pay | Admitting: Family Medicine

## 2020-03-03 DIAGNOSIS — I714 Abdominal aortic aneurysm, without rupture, unspecified: Secondary | ICD-10-CM

## 2020-03-03 DIAGNOSIS — K828 Other specified diseases of gallbladder: Secondary | ICD-10-CM

## 2020-03-03 DIAGNOSIS — N8110 Cystocele, unspecified: Secondary | ICD-10-CM

## 2020-03-03 HISTORY — DX: Cystocele, unspecified: N81.10

## 2020-03-03 MED ORDER — TAMSULOSIN HCL 0.4 MG PO CAPS
0.4000 mg | ORAL_CAPSULE | Freq: Every day | ORAL | 0 refills | Status: DC
Start: 2020-03-03 — End: 2020-04-02

## 2020-03-03 NOTE — Telephone Encounter (Signed)
Pt would like to start Flomax and sent to [facility] in [place]. Pt was given all results/instructions. She has not been to GYN yet since she has had everything else going on right now but will make appt. She also said they have not called her for the appt with urology yet. I told pt I would send message to see where the referral process was at and update her ASAP.   [place] can we look into this referral please? The CT scan results are back to send with records if needed.   Thanks

## 2020-03-03 NOTE — Addendum Note (Signed)
Addended by: Howard Pouch A on: 03/03/2020 04:42 PM   Modules accepted: Orders

## 2020-03-03 NOTE — Telephone Encounter (Signed)
flomax prescribed. Please give pt urology phone number so she can call and make appt. If need be can refer to Dr. Felipa Eth in [facility] if [facility] unable to get her in and if it is in network for her.

## 2020-03-03 NOTE — Telephone Encounter (Signed)
To be honest with you, [facility] is taking awhile - I think they have been short staffed and I know they have 2 new providers starting this month which should help speed things along once they get acclimated.   They have her referral - she can call them to set up the appt - [phone]

## 2020-03-03 NOTE — Telephone Encounter (Signed)
Pt was called and she said they called and scheduled the appt for 04/07/2020. She did not want to see a MD in [place] and will keep that appt for now. She will call back if that changes.

## 2020-03-03 NOTE — Telephone Encounter (Signed)
Please inform patient: 5 mm kidney stone is moving down her right ureter towards her bladder-it is very close to her bladder now. Has she got in to see a urologist yet?  Referral was placed urgently 02/18/2020.  If she has not been evaluated by urology, we can help her pass this kidney stone with a medication called Flomax that she can take once a day until stone passes.  Please make sure she has a urinary strainer/hat so she can monitor for passage of stone.   If we do prescribe Flomax, needs to be taken with a meal once daily.  It can cause mild dizziness and some, therefore she may want to take with her dinner. Flomax would be stopped once kidney stone passes, by urology or in 4 weeks-whichever occurs first.    Incidentally also commented on: - Her aortic aneurysm is stable.  I will forward study to her cardiologist so they have on hand. - She does have some small appearing gallstones present in her gallbladder, but no signs of inflamed gallbladder.  They are very small and likely not the cause of her discomfort. 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  I will forward this result to her gastroenterologist so they have on hand since they are following this condition. - Pelvic floor laxity with cystocele> which means bladder prolapsing into vaginal vault> we had referred her to gynecology in January concerning her urinary frequency and bladder.  Did she establish?

## 2020-03-05 ENCOUNTER — Encounter: Payer: Self-pay | Admitting: Gastroenterology

## 2020-03-06 ENCOUNTER — Telehealth: Payer: Self-pay

## 2020-03-06 NOTE — Telephone Encounter (Signed)
Pt was called and said she was not at home and needed to look at the bottles of medication to ask the questions she needed to ask. She will call back when she gets home. She was told she has a refill on the Tramadol and can call the pharmacy to get that refilled.

## 2020-03-06 NOTE — Telephone Encounter (Signed)
Patient called in wanting to speak with a nurse about her medications. She is unsure about some medication she pick up from pharmacy prescribed from another Dr. Also patient is requesting a refill on her pain medication   traMADol (ULTRAM) 50 MG tablet  Un sure medication: PANTOPRAZOLE 40 MG TABLET    Please call and advise

## 2020-03-09 ENCOUNTER — Other Ambulatory Visit: Payer: Self-pay | Admitting: Family Medicine

## 2020-03-09 NOTE — Telephone Encounter (Signed)
Patient called in stating that pharmacy is still stating that she does not have ay refills left for this medication   traMADol (ULTRAM) 50 MG tablet    Please call and advise

## 2020-03-09 NOTE — Telephone Encounter (Signed)
Patient states she doesn't have anymore refills on her tramadol but it was send in on 12/06/19 # 180 x 1 rfs.  Contacted pharmacy and was told patient picked up tramadol 12/06/19 # 180 and then she picked up another # 180 on 01/20/20.  Please advise if this was appropriate?

## 2020-03-09 NOTE — Telephone Encounter (Signed)
Contacted pharmacy, also sent message to Dr Raoul Pitch.   Please see RX note.

## 2020-03-10 ENCOUNTER — Telehealth: Payer: Self-pay

## 2020-03-10 NOTE — Telephone Encounter (Signed)
Patient aware Rx is at pharmacy.

## 2020-03-10 NOTE — Telephone Encounter (Signed)
Pt would like Xarelto samples. Pt states she has 3 tablets   Please call [phone]   thanks renee

## 2020-03-10 NOTE — Telephone Encounter (Signed)
Pt notified that Xarelto samples have been placed at front desk for pick up.

## 2020-03-14 ENCOUNTER — Other Ambulatory Visit: Payer: Self-pay | Admitting: Cardiology

## 2020-03-17 ENCOUNTER — Encounter (HOSPITAL_COMMUNITY): Payer: Self-pay

## 2020-03-17 ENCOUNTER — Inpatient Hospital Stay (HOSPITAL_COMMUNITY)
Admission: EM | Admit: 2020-03-17 | Discharge: 2020-03-24 | DRG: 988 | Disposition: A | Discharge status: Home / Self Care | Payer: 59 | Attending: Internal Medicine | Admitting: Internal Medicine

## 2020-03-17 ENCOUNTER — Other Ambulatory Visit: Payer: Self-pay

## 2020-03-17 ENCOUNTER — Emergency Department (HOSPITAL_COMMUNITY): Payer: 59

## 2020-03-17 ENCOUNTER — Emergency Department (HOSPITAL_COMMUNITY): Admission: EM | Admit: 2020-03-17 | Discharge: 2020-03-17 | Discharge status: Against Medical Advice | Payer: 59

## 2020-03-17 DIAGNOSIS — N202 Calculus of kidney with calculus of ureter: Secondary | ICD-10-CM | POA: Diagnosis present

## 2020-03-17 DIAGNOSIS — I714 Abdominal aortic aneurysm, without rupture, unspecified: Secondary | ICD-10-CM

## 2020-03-17 DIAGNOSIS — M898X8 Other specified disorders of bone, other site: Secondary | ICD-10-CM | POA: Diagnosis present

## 2020-03-17 DIAGNOSIS — K8070 Calculus of gallbladder and bile duct without cholecystitis without obstruction: Secondary | ICD-10-CM | POA: Diagnosis present

## 2020-03-17 DIAGNOSIS — M170 Bilateral primary osteoarthritis of knee: Secondary | ICD-10-CM | POA: Diagnosis present

## 2020-03-17 DIAGNOSIS — M84451A Pathological fracture, right femur, initial encounter for fracture: Secondary | ICD-10-CM | POA: Diagnosis present

## 2020-03-17 DIAGNOSIS — I482 Chronic atrial fibrillation, unspecified: Secondary | ICD-10-CM | POA: Diagnosis present

## 2020-03-17 DIAGNOSIS — I10 Essential (primary) hypertension: Secondary | ICD-10-CM | POA: Diagnosis present

## 2020-03-17 DIAGNOSIS — K861 Other chronic pancreatitis: Secondary | ICD-10-CM | POA: Diagnosis present

## 2020-03-17 DIAGNOSIS — I110 Hypertensive heart disease with heart failure: Secondary | ICD-10-CM | POA: Diagnosis present

## 2020-03-17 DIAGNOSIS — E7800 Pure hypercholesterolemia, unspecified: Secondary | ICD-10-CM | POA: Diagnosis not present

## 2020-03-17 DIAGNOSIS — Z833 Family history of diabetes mellitus: Secondary | ICD-10-CM

## 2020-03-17 DIAGNOSIS — W19XXXA Unspecified fall, initial encounter: Secondary | ICD-10-CM | POA: Diagnosis present

## 2020-03-17 DIAGNOSIS — I480 Paroxysmal atrial fibrillation: Secondary | ICD-10-CM | POA: Diagnosis present

## 2020-03-17 DIAGNOSIS — R109 Unspecified abdominal pain: Secondary | ICD-10-CM | POA: Diagnosis present

## 2020-03-17 DIAGNOSIS — K859 Acute pancreatitis without necrosis or infection, unspecified: Secondary | ICD-10-CM | POA: Diagnosis present

## 2020-03-17 DIAGNOSIS — F17210 Nicotine dependence, cigarettes, uncomplicated: Secondary | ICD-10-CM | POA: Diagnosis present

## 2020-03-17 DIAGNOSIS — K8689 Other specified diseases of pancreas: Secondary | ICD-10-CM

## 2020-03-17 DIAGNOSIS — Z886 Allergy status to analgesic agent status: Secondary | ICD-10-CM

## 2020-03-17 DIAGNOSIS — I252 Old myocardial infarction: Secondary | ICD-10-CM

## 2020-03-17 DIAGNOSIS — Z79891 Long term (current) use of opiate analgesic: Secondary | ICD-10-CM

## 2020-03-17 DIAGNOSIS — I5032 Chronic diastolic (congestive) heart failure: Secondary | ICD-10-CM | POA: Diagnosis present

## 2020-03-17 DIAGNOSIS — M899 Disorder of bone, unspecified: Secondary | ICD-10-CM

## 2020-03-17 DIAGNOSIS — Z8673 Personal history of transient ischemic attack (TIA), and cerebral infarction without residual deficits: Secondary | ICD-10-CM

## 2020-03-17 DIAGNOSIS — I25810 Atherosclerosis of coronary artery bypass graft(s) without angina pectoris: Secondary | ICD-10-CM

## 2020-03-17 DIAGNOSIS — I63512 Cerebral infarction due to unspecified occlusion or stenosis of left middle cerebral artery: Secondary | ICD-10-CM | POA: Diagnosis present

## 2020-03-17 DIAGNOSIS — Z951 Presence of aortocoronary bypass graft: Secondary | ICD-10-CM

## 2020-03-17 DIAGNOSIS — N3000 Acute cystitis without hematuria: Secondary | ICD-10-CM | POA: Diagnosis present

## 2020-03-17 DIAGNOSIS — E876 Hypokalemia: Secondary | ICD-10-CM | POA: Diagnosis present

## 2020-03-17 DIAGNOSIS — K8500 Idiopathic acute pancreatitis without necrosis or infection: Secondary | ICD-10-CM | POA: Diagnosis present

## 2020-03-17 DIAGNOSIS — IMO0002 Reserved for concepts with insufficient information to code with codable children: Secondary | ICD-10-CM

## 2020-03-17 DIAGNOSIS — I2510 Atherosclerotic heart disease of native coronary artery without angina pectoris: Secondary | ICD-10-CM | POA: Diagnosis present

## 2020-03-17 DIAGNOSIS — Z8249 Family history of ischemic heart disease and other diseases of the circulatory system: Secondary | ICD-10-CM

## 2020-03-17 DIAGNOSIS — B9620 Unspecified Escherichia coli [E. coli] as the cause of diseases classified elsewhere: Secondary | ICD-10-CM | POA: Diagnosis present

## 2020-03-17 DIAGNOSIS — Z8616 Personal history of COVID-19: Secondary | ICD-10-CM

## 2020-03-17 DIAGNOSIS — Z888 Allergy status to other drugs, medicaments and biological substances status: Secondary | ICD-10-CM

## 2020-03-17 DIAGNOSIS — K863 Pseudocyst of pancreas: Secondary | ICD-10-CM | POA: Diagnosis present

## 2020-03-17 DIAGNOSIS — M898X5 Other specified disorders of bone, thigh: Secondary | ICD-10-CM

## 2020-03-17 DIAGNOSIS — Z7902 Long term (current) use of antithrombotics/antiplatelets: Secondary | ICD-10-CM | POA: Diagnosis not present

## 2020-03-17 DIAGNOSIS — Z9181 History of falling: Secondary | ICD-10-CM

## 2020-03-17 DIAGNOSIS — I255 Ischemic cardiomyopathy: Secondary | ICD-10-CM | POA: Diagnosis present

## 2020-03-17 DIAGNOSIS — E785 Hyperlipidemia, unspecified: Secondary | ICD-10-CM | POA: Diagnosis present

## 2020-03-17 DIAGNOSIS — Z79899 Other long term (current) drug therapy: Secondary | ICD-10-CM

## 2020-03-17 DIAGNOSIS — F419 Anxiety disorder, unspecified: Secondary | ICD-10-CM | POA: Diagnosis present

## 2020-03-17 DIAGNOSIS — Z01818 Encounter for other preprocedural examination: Secondary | ICD-10-CM | POA: Diagnosis not present

## 2020-03-17 DIAGNOSIS — Z83438 Family history of other disorder of lipoprotein metabolism and other lipidemia: Secondary | ICD-10-CM

## 2020-03-17 HISTORY — DX: Abdominal aortic aneurysm, without rupture: I71.4

## 2020-03-17 HISTORY — DX: Abdominal aortic aneurysm, without rupture, unspecified: I71.40

## 2020-03-17 HISTORY — DX: Occlusion and stenosis of unspecified carotid artery: I65.29

## 2020-03-17 LAB — CBC
HCT: 47.5 % — ABNORMAL HIGH (ref 36.0–46.0)
Hemoglobin: 15.4 g/dL — ABNORMAL HIGH (ref 12.0–15.0)
MCH: 32 pg (ref 26.0–34.0)
MCHC: 32.4 g/dL (ref 30.0–36.0)
MCV: 98.8 fL (ref 80.0–100.0)
Platelets: 377 10*3/uL (ref 150–400)
RBC: 4.81 MIL/uL (ref 3.87–5.11)
RDW: 13.1 % (ref 11.5–15.5)
WBC: 16.8 10*3/uL — ABNORMAL HIGH (ref 4.0–10.5)
nRBC: 0 % (ref 0.0–0.2)

## 2020-03-17 LAB — COMPREHENSIVE METABOLIC PANEL
ALT: 15 U/L (ref 0–44)
AST: 19 U/L (ref 15–41)
Albumin: 4.2 g/dL (ref 3.5–5.0)
Alkaline Phosphatase: 70 U/L (ref 38–126)
Anion gap: 8 (ref 5–15)
BUN: 11 mg/dL (ref 6–20)
CO2: 27 mmol/L (ref 22–32)
Calcium: 8.8 mg/dL — ABNORMAL LOW (ref 8.9–10.3)
Chloride: 103 mmol/L (ref 98–111)
Creatinine, Ser: 0.58 mg/dL (ref 0.44–1.00)
GFR calc Af Amer: >60 mL/min (ref >60)
GFR calc non Af Amer: >60 mL/min (ref >60)
Glucose, Bld: 138 mg/dL — ABNORMAL HIGH (ref 70–99)
Potassium: 3 mmol/L — ABNORMAL LOW (ref 3.5–5.1)
Sodium: 138 mmol/L (ref 135–145)
Total Bilirubin: 0.4 mg/dL (ref 0.3–1.2)
Total Protein: 7.2 g/dL (ref 6.5–8.1)

## 2020-03-17 LAB — MAGNESIUM: Magnesium: 2.2 mg/dL (ref 1.7–2.4)

## 2020-03-17 LAB — PROTIME-INR
INR: 1.2 (ref 0.8–1.2)
Prothrombin Time: 15 seconds (ref 11.4–15.2)

## 2020-03-17 LAB — URINALYSIS, ROUTINE W REFLEX MICROSCOPIC
Bilirubin Urine: NEGATIVE
Glucose, UA: NEGATIVE mg/dL
Hgb urine dipstick: NEGATIVE
Ketones, ur: 20 mg/dL — AB
Leukocytes,Ua: NEGATIVE
Nitrite: NEGATIVE
Protein, ur: 30 mg/dL — AB
Specific Gravity, Urine: 1.011 (ref 1.005–1.030)
pH: 7 (ref 5.0–8.0)

## 2020-03-17 LAB — LIPASE, BLOOD: Lipase: 6271 U/L — ABNORMAL HIGH (ref 11–51)

## 2020-03-17 LAB — LACTIC ACID, PLASMA: Lactic Acid, Venous: 1.1 mmol/L (ref 0.5–1.9)

## 2020-03-17 LAB — I-STAT BETA HCG BLOOD, ED (MC, WL, AP ONLY): I-stat hCG, quantitative: <5 m[IU]/mL (ref <5)

## 2020-03-17 LAB — APTT: aPTT: 40 seconds — ABNORMAL HIGH (ref 24–36)

## 2020-03-17 LAB — SARS CORONAVIRUS 2 BY RT PCR (HOSPITAL ORDER, PERFORMED IN ~~LOC~~ HOSPITAL LAB): SARS Coronavirus 2: NEGATIVE

## 2020-03-17 MED ORDER — SODIUM CHLORIDE (PF) 0.9 % IJ SOLN
INTRAMUSCULAR | Status: AC
  Administered 2020-03-17: 3 mL via INTRAVENOUS
  Filled 2020-03-17: qty 50

## 2020-03-17 MED ORDER — BISACODYL 5 MG PO TBEC
5.0000 mg | DELAYED_RELEASE_TABLET | Freq: Every day | ORAL | Status: DC | PRN
  Filled 2020-03-17: qty 1

## 2020-03-17 MED ORDER — LEVOCETIRIZINE DIHYDROCHLORIDE 5 MG PO TABS: 5.0000 mg | ORAL_TABLET | Freq: Every evening | ORAL | Status: DC

## 2020-03-17 MED ORDER — ENOXAPARIN SODIUM 40 MG/0.4ML ~~LOC~~ SOLN: 40.0000 mg | SUBCUTANEOUS | Status: DC

## 2020-03-17 MED ORDER — MORPHINE SULFATE (PF) 4 MG/ML IV SOLN
4.0000 mg | Freq: Once | INTRAVENOUS | Status: AC
  Administered 2020-03-17: 4 mg via INTRAVENOUS
  Filled 2020-03-17: qty 1

## 2020-03-17 MED ORDER — HYDRALAZINE HCL 20 MG/ML IJ SOLN: 10.0000 mg | INTRAMUSCULAR | Status: DC | PRN

## 2020-03-17 MED ORDER — LACTATED RINGERS IV BOLUS (SEPSIS)
1000.0000 mL | Freq: Once | INTRAVENOUS | Status: AC
  Administered 2020-03-17: 1000 mL via INTRAVENOUS

## 2020-03-17 MED ORDER — CALCIUM CARBONATE ANTACID 500 MG PO CHEW
1.0000 | CHEWABLE_TABLET | Freq: Two times a day (BID) | ORAL | Status: DC
  Administered 2020-03-17 – 2020-03-24 (×11): 200 mg via ORAL
  Filled 2020-03-17 (×14): qty 1

## 2020-03-17 MED ORDER — SODIUM CHLORIDE 0.9 % IV SOLN
1.0000 g | Freq: Once | INTRAVENOUS | Status: AC
  Administered 2020-03-17: 1 g via INTRAVENOUS
  Filled 2020-03-17: qty 10

## 2020-03-17 MED ORDER — SODIUM CHLORIDE 0.9% FLUSH: 3.0000 mL | Freq: Once | INTRAVENOUS | Status: AC

## 2020-03-17 MED ORDER — TAMSULOSIN HCL 0.4 MG PO CAPS
0.4000 mg | ORAL_CAPSULE | Freq: Every day | ORAL | Status: DC
  Administered 2020-03-17 – 2020-03-24 (×7): 0.4 mg via ORAL
  Filled 2020-03-17 (×8): qty 1

## 2020-03-17 MED ORDER — SENNOSIDES-DOCUSATE SODIUM 8.6-50 MG PO TABS: 1.0000 | ORAL_TABLET | Freq: Every evening | ORAL | Status: DC | PRN

## 2020-03-17 MED ORDER — POTASSIUM CHLORIDE 10 MEQ/100ML IV SOLN
10.0000 meq | INTRAVENOUS | Status: AC
  Administered 2020-03-17 (×4): 10 meq via INTRAVENOUS
  Filled 2020-03-17 (×4): qty 100

## 2020-03-17 MED ORDER — MORPHINE SULFATE (PF) 4 MG/ML IV SOLN
4.0000 mg | INTRAVENOUS | Status: DC | PRN
  Filled 2020-03-17: qty 1

## 2020-03-17 MED ORDER — RIVAROXABAN 10 MG PO TABS
20.0000 mg | ORAL_TABLET | Freq: Every day | ORAL | Status: DC
  Administered 2020-03-17: 20 mg via ORAL
  Filled 2020-03-17: qty 1

## 2020-03-17 MED ORDER — KCL-LACTATED RINGERS-D5W 20 MEQ/L IV SOLN
INTRAVENOUS | Status: DC
  Filled 2020-03-17: qty 1000

## 2020-03-17 MED ORDER — LACTATED RINGERS IV SOLN: INTRAVENOUS | Status: AC

## 2020-03-17 MED ORDER — IOHEXOL 300 MG/ML  SOLN
100.0000 mL | Freq: Once | INTRAMUSCULAR | Status: AC | PRN
  Administered 2020-03-17: 100 mL via INTRAVENOUS

## 2020-03-17 MED ORDER — HYDROXYZINE HCL 10 MG PO TABS
10.0000 mg | ORAL_TABLET | Freq: Every day | ORAL | Status: DC
  Administered 2020-03-17 – 2020-03-23 (×7): 10 mg via ORAL
  Filled 2020-03-17 (×6): qty 1

## 2020-03-17 MED ORDER — PANTOPRAZOLE SODIUM 40 MG IV SOLR
40.0000 mg | Freq: Two times a day (BID) | INTRAVENOUS | Status: DC
  Administered 2020-03-17 – 2020-03-18 (×3): 40 mg via INTRAVENOUS
  Filled 2020-03-17 (×2): qty 40

## 2020-03-17 MED ORDER — CETIRIZINE HCL 10 MG PO TABS
10.0000 mg | ORAL_TABLET | Freq: Every day | ORAL | Status: DC
  Administered 2020-03-17 – 2020-03-24 (×7): 10 mg via ORAL
  Filled 2020-03-17 (×8): qty 1

## 2020-03-17 MED ORDER — HYDROMORPHONE HCL 1 MG/ML IJ SOLN: 0.5000 mg | INTRAMUSCULAR | Status: DC | PRN

## 2020-03-17 MED ORDER — BACLOFEN 10 MG PO TABS
10.0000 mg | ORAL_TABLET | Freq: Two times a day (BID) | ORAL | Status: DC
  Administered 2020-03-17 – 2020-03-24 (×13): 10 mg via ORAL
  Filled 2020-03-17 (×14): qty 1

## 2020-03-17 MED ORDER — FENTANYL CITRATE (PF) 100 MCG/2ML IJ SOLN: 50.0000 ug | Freq: Once | INTRAMUSCULAR | Status: DC

## 2020-03-17 MED ORDER — LACTATED RINGERS IV SOLN: INTRAVENOUS | Status: DC

## 2020-03-17 MED ORDER — ONDANSETRON 4 MG PO TBDP
4.0000 mg | ORAL_TABLET | Freq: Once | ORAL | Status: AC | PRN
  Administered 2020-03-17: 4 mg via ORAL
  Filled 2020-03-17: qty 1

## 2020-03-17 MED ORDER — CARVEDILOL 3.125 MG PO TABS
3.1250 mg | ORAL_TABLET | Freq: Two times a day (BID) | ORAL | Status: DC
  Administered 2020-03-19 – 2020-03-24 (×11): 3.125 mg via ORAL
  Filled 2020-03-17 (×13): qty 1

## 2020-03-17 NOTE — ED Provider Notes (Signed)
[facility] DEPT Provider Note   CSN: [id number] Arrival date & time: 03/17/20  0113     History Chief Complaint  Patient presents with  . Abdominal Pain    [name] is a 59 y.o. female with past medical history of stroke, recurrent pancreatitis, CAD, CABG, ischemic cardiomyopathy, who presents today for evaluation of abdominal pain.  She reports that last night she was attempting to fall asleep when she developed severe upper abdominal pain.  She states that this feels consistent with her usual pancreatitis pain.  She reports significant nausea without vomiting.  She states that she used an enema to try and have a bowel movement however did not have one.  She denies any fevers, cough or shortness of breath.  She has been vaccinated against coronavirus.  She denies any leg swelling.  No recent trauma.    HPI     Past Medical History:  Diagnosis Date  . Angio-edema   . Arthritis   . CAD (coronary artery disease)    a. s/p NSTEMI in 10/2017 with cath showing LM disease --> s/p CABG on 11/23/2017 with LIMA-LAD and SVG-OM.   [name] Hay fever   . Ischemic cardiomyopathy    a. EF 35-40% by echo in 10/2017  . NSTEMI (non-ST elevated myocardial infarction) ([place])   . PAF (paroxysmal atrial fibrillation) ([place])    Diagnosed by ILR  . Pancreatitis   . Pneumonia 11/16/2017  . Stroke [facility]) 08/23/2017   Left MCA infarct status post TPA and thrombectomy  . Urticaria     Patient Active Problem List   Diagnosis Date Noted  . Cystocele, unspecified  03/03/2020  . Gross hematuria 02/18/2020  . Right kidney stone 02/18/2020  . Idiopathic recurrent acute pancreatitis 01/25/2020  . Gallbladder sludge and cholelithiasis without cholecystitis 01/25/2020  . Abnormal findings on endoscopic ultrasound (EUS) 01/25/2020  . Abnormality present on pathology (Atypical Pathology) 01/25/2020  . Gastritis and duodenitis 12/06/2019  . Severe protein-calorie malnutrition  ([place])   . Abdominal pain 11/07/2019  . Pancreatitis, recurrent 11/07/2019  . Hashimoto's thyroiditis 10/22/2019  . Thyroid function study abnormality 10/21/2019  . Pilonidal cyst 10/21/2019  . Urinary incontinence 10/21/2019  . Pancreatic cyst 09/11/2019  . AAA (abdominal aortic aneurysm) ([place]), infrarenal - ulcerative plaque (3.1cm)- 09/10/2019 09/11/2019  . Abnormality of gait 08/27/2019  . Chronic rhinitis 07/09/2019  . 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 Carpal tunnel syndrome of left wrist 03/15/2019  . Anxiety 10/24/2018  . Numbness of left hand 10/19/2018  . Spastic hemiparesis affecting dominant side ([place]) 09/20/2018  . Primary osteoarthritis of both knees 07/04/2018  . Iron deficiency 04/23/2018  . Atherosclerosis of aorta ([place]) 02/02/2018  . Osteoarthritis of left knee 01/16/2018  . Osteoarthritis of right knee 01/16/2018  . Sequela of cerebrovascular accident 01/16/2018  . Paroxysmal atrial fibrillation ([place]) 12/19/2017  . Chronic diastolic CHF (congestive heart failure) ([place]) 12/16/2017  . Benign essential HTN   . Supplemental oxygen dependent   . 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 Coronary artery disease 11/23/2017  . NSTEMI (non-ST elevated myocardial infarction) ([place]) 11/16/2017  . Urticaria 10/13/2017  . Former smoker 10/13/2017  . Angioedema   . 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 Cerebrovascular accident (CVA) due to occlusion of left middle cerebral artery ([place])   . Right hemiplegia ([place])   . Dysphagia, post-stroke   . Hyperlipidemia LDL goal <70     Past Surgical History:  Procedure Laterality Date  .  BIOPSY  12/02/2019   Procedure: BIOPSY;  Surgeon: Rush Landmark Telford Nab., MD;  Location: [place];  Service: Gastroenterology;;  . CORONARY ARTERY BYPASS GRAFT N/A 11/23/2017   Procedure: CORONARY ARTERY BYPASS GRAFTING (CABG) x 2, ON PUMP, USING LEFT INTERNAL MAMMARY ARTERY AND RIGHT GREATER SAPHENOUS VEIN HARVESTED ENDOSCOPICALLY;   Surgeon: Gaye Pollack, MD;  Location: [place];  Service: Open Heart Surgery;  Laterality: N/A;  . CRYOABLATION     cervical  . ESOPHAGOGASTRODUODENOSCOPY (EGD) WITH PROPOFOL N/A 12/02/2019   Procedure: ESOPHAGOGASTRODUODENOSCOPY (EGD) WITH PROPOFOL;  Surgeon: Rush Landmark Telford Nab., MD;  Location: [place];  Service: Gastroenterology;  Laterality: N/A;  . FINE NEEDLE ASPIRATION  12/02/2019   Procedure: FINE NEEDLE ASPIRATION (FNA) LINEAR;  Surgeon: Irving Copas., MD;  Location: [place];  Service: Gastroenterology;;  . IR ANGIO INTRA EXTRACRAN SEL COM CAROTID INNOMINATE UNI R MOD SED  08/21/2017  . IR ANGIO VERTEBRAL SEL SUBCLAVIAN INNOMINATE UNI R MOD SED  08/21/2017  . IR PERCUTANEOUS ART THROMBECTOMY/INFUSION INTRACRANIAL INC DIAG ANGIO  08/21/2017  . IR RADIOLOGIST EVAL & MGMT  10/30/2017  . LOOP RECORDER INSERTION N/A 08/28/2017   Procedure: LOOP RECORDER INSERTION;  Surgeon: Constance Haw, MD;  Location: [place] CV LAB;  Service: Cardiovascular;  Laterality: N/A;  . RADIOLOGY WITH ANESTHESIA N/A 08/21/2017   Procedure: RADIOLOGY WITH ANESTHESIA;  Surgeon: Luanne Bras, MD;  Location: [place];  Service: Radiology;  Laterality: N/A;  . RIGHT/LEFT HEART CATH AND CORONARY ANGIOGRAPHY N/A 11/20/2017   Procedure: RIGHT/LEFT HEART CATH AND CORONARY ANGIOGRAPHY;  Surgeon: Jettie Booze, MD;  Location: [place] CV LAB;  Service: Cardiovascular;  Laterality: N/A;  . TEE WITHOUT CARDIOVERSION N/A 08/28/2017   Procedure: TRANSESOPHAGEAL ECHOCARDIOGRAM (TEE);  Surgeon: Fay Records, MD;  Location: [place];  Service: Cardiovascular;  Laterality: N/A;  . TEE WITHOUT CARDIOVERSION N/A 11/23/2017   Procedure: TRANSESOPHAGEAL ECHOCARDIOGRAM (TEE);  Surgeon: Gaye Pollack, MD;  Location: [place];  Service: Open Heart Surgery;  Laterality: N/A;  . TUBAL LIGATION    . UPPER ESOPHAGEAL ENDOSCOPIC ULTRASOUND (EUS) N/A 12/02/2019   Procedure: UPPER ESOPHAGEAL ENDOSCOPIC  ULTRASOUND (EUS);  Surgeon: Irving Copas., MD;  Location: [place];  Service: Gastroenterology;  Laterality: N/A;     OB History    Gravida  2   Para  2   Term      Preterm      AB      Living  2     SAB      TAB      Ectopic      Multiple      Live Births              Family History  Problem Relation Age of Onset  . Heart attack Father   . Hypertension Mother   . Hyperlipidemia Mother   . Diabetes type II Mother   . Urticaria Mother   . Hypertension Brother   . Breast cancer Cousin 66  . Immunodeficiency Neg Hx   . Eczema Neg Hx   . Atopy Neg Hx   . Asthma Neg Hx   . Angioedema Neg Hx   . Allergic rhinitis Neg Hx   . Colon cancer Neg Hx   . Esophageal cancer Neg Hx   . Stomach cancer Neg Hx   . Pancreatic cancer Neg Hx   . Colon polyps Neg Hx   . Esophageal varices Neg Hx   . Inflammatory bowel disease Neg  Hx   . Rectal cancer Neg Hx     Social History   Tobacco Use  . Smoking status: Current Some Day Smoker    Packs/day: 0.10    Years: 37.00    Pack years: 3.70    Types: Cigarettes  . Smokeless tobacco: Never Used  Vaping Use  . Vaping Use: Never used  Substance Use Topics  . Alcohol use: No  . Drug use: No    Home Medications Prior to Admission medications   Medication Sig Start Date End Date Taking? Authorizing Provider  acetaminophen (TYLENOL) 325 MG tablet Take 2 tablets (650 mg total) by mouth every 6 (six) hours as needed for mild pain or moderate pain (or Fever >/= 101). 09/13/19  Yes Rai, Ripudeep K, MD  azelastine (ASTELIN) 0.1 % nasal spray Place 1-2 sprays into both nostrils 2 (two) times daily as needed for rhinitis. Use in each nostril as directed 09/17/19  Yes Bobbitt, [place] Muta, MD  baclofen (LIORESAL) 10 MG tablet Take 10 mg by mouth in the morning and at bedtime.   Yes [provider]  calcium carbonate (TUMS - DOSED IN MG ELEMENTAL CALCIUM) 500 MG chewable tablet Chew 1 tablet by mouth 2 (two)  times daily.    Yes [provider]  carvedilol (COREG) 3.125 MG tablet Take 1 tablet (3.125 mg total) by mouth in the morning and at bedtime. 03/16/20  Yes Satira Sark, MD  cholecalciferol (VITAMIN D) 1000 units tablet Take 1,000 Units by mouth daily.   Yes [provider]  famotidine (PEPCID) 20 MG tablet Take 1 tablet (20 mg total) by mouth 2 (two) times daily. 12/06/19  Yes [name], [name], DO  ferrous sulfate 325 (65 FE) MG tablet Take 325 mg by mouth 2 (two) times a week. Mondays  and Thursdays.   Yes [provider]  hydrOXYzine (ATARAX/VISTARIL) 10 MG tablet Take 1 tablet (10 mg total) by mouth at bedtime. 12/06/19  Yes [name], [name], DO  levocetirizine (XYZAL) 5 MG tablet Take 1 tablet (5 mg total) by mouth every evening. 12/06/19  Yes [name], [name], DO  omeprazole (PRILOSEC) 40 MG capsule Take 1 capsule (40 mg total) by mouth daily. 12/06/19  Yes [name], [name], DO  ondansetron (ZOFRAN ODT) 4 MG disintegrating tablet Take 1 tablet (4 mg total) by mouth every 8 (eight) hours as needed for nausea or vomiting. 12/04/19  Yes Rai, Ripudeep K, MD  pantoprazole (PROTONIX) 40 MG tablet Take 40 mg by mouth daily. 03/03/20  Yes [provider]  pravastatin (PRAVACHOL) 20 MG tablet Take 1 tablet (20 mg total) by mouth every evening. 02/18/20  Yes Strader, [place] M, PA-C  rivaroxaban (XARELTO) 20 MG TABS tablet Take 1 tablet daily with evening meal Patient taking differently: Take 20 mg by mouth daily with supper.  09/25/19  Yes Satira Sark, MD  tamsulosin (FLOMAX) 0.4 MG CAPS capsule Take 1 capsule (0.4 mg total) by mouth daily. 03/03/20  Yes [name], [name], DO  traMADol (ULTRAM) 50 MG tablet TAKE 1 TO 2 TABLETS BY MOUTH EVERY 8 HOURS AS NEEDED FOR MODERATE PAIN Patient taking differently: Take 50-100 mg by mouth every 8 (eight) hours as needed for moderate pain.  03/09/20  Yes [name], [name], DO  vitamin B-12 (CYANOCOBALAMIN) 100 MCG tablet Take 100 mcg  by mouth daily.   Yes [provider]    Allergies    Lopressor [metoprolol tartrate], Allegra allergy [fexofenadine hcl], Dilaudid [hydromorphone],  Nsaids, Gabapentin (once-daily), and Lipitor [atorvastatin]  Review of Systems   Review of Systems  Constitutional: Negative for chills and fever.  HENT: Negative for congestion.   Respiratory: Negative for chest tightness and shortness of breath.   Cardiovascular: Negative for chest pain.  Gastrointestinal: Positive for abdominal pain and nausea. Negative for constipation, diarrhea and vomiting.  Genitourinary: Negative for dysuria and urgency.  Musculoskeletal: Negative for back pain and neck pain.  Neurological: Negative for speech difficulty, weakness and headaches.  All other systems reviewed and are negative.   Physical Exam Updated Vital Signs BP (!) 144/94 (BP Location: Left Arm)   Pulse (!) 107   Temp (!) 97.5 F (36.4 C) (Oral)   Resp 16   Ht 5' (1.524 m)   Wt 52.6 kg   LMP  (LMP Unknown) Comment: at age 52  SpO2 92%   BMI 22.65 kg/m   Physical Exam Vitals and nursing note reviewed.  Constitutional:      General: She is not in acute distress.    Appearance: She is well-developed.  HENT:     Head: Normocephalic and atraumatic.  Eyes:     Conjunctiva/sclera: Conjunctivae normal.  Cardiovascular:     Rate and Rhythm: Regular rhythm. Tachycardia present.     Heart sounds: No murmur heard.   Pulmonary:     Effort: Pulmonary effort is normal. No respiratory distress.     Breath sounds: Normal breath sounds.  Abdominal:     General: Abdomen is flat. Bowel sounds are normal.     Palpations: Abdomen is soft.     Tenderness: There is abdominal tenderness in the right upper quadrant, epigastric area and left upper quadrant.     Hernia: No hernia is present.  Musculoskeletal:     Cervical back: Neck supple.  Skin:    General: Skin is warm and dry.  Neurological:     Mental Status: She is alert.      Comments: Patient is awake and alert.  Answers questions appropriately.   Psychiatric:        Mood and Affect: Mood normal.        Behavior: Behavior normal.     ED Results / Procedures / Treatments   Labs (all labs ordered are listed, but only abnormal results are displayed) Labs Reviewed  LIPASE, BLOOD - Abnormal; Notable for the following components:      Result Value   Lipase 6,271 (*)    All other components within normal limits  COMPREHENSIVE METABOLIC PANEL - Abnormal; Notable for the following components:   Potassium 3.0 (*)    Glucose, Bld 138 (*)    Calcium 8.8 (*)    All other components within normal limits  CBC - Abnormal; Notable for the following components:   WBC 16.8 (*)    Hemoglobin 15.4 (*)    HCT 47.5 (*)    All other components within normal limits  URINALYSIS, ROUTINE W REFLEX MICROSCOPIC - Abnormal; Notable for the following components:   APPearance HAZY (*)    Ketones, ur 20 (*)    Protein, ur 30 (*)    Bacteria, UA MANY (*)    All other components within normal limits  APTT - Abnormal; Notable for the following components:   aPTT 40 (*)    All other components within normal limits  CULTURE, BLOOD (ROUTINE X 2)  CULTURE, BLOOD (ROUTINE X 2)  URINE CULTURE  LACTIC ACID, PLASMA  PROTIME-INR  MAGNESIUM  LACTIC ACID, PLASMA  I-STAT BETA HCG BLOOD, ED ([facility], [facility], [facility] ONLY)    EKG EKG Interpretation  Date/Time:  Tuesday March 17 2020 02:07:31 EDT Ventricular Rate:  94 PR Interval:    QRS Duration: 118 QT Interval:  378 QTC Calculation: 473 R Axis:   76 Text Interpretation: Sinus rhythm Paired ventricular premature complexes Probable left atrial enlargement Nonspecific intraventricular conduction delay When compared with ECG of 11/29/2019, No significant change was found Confirmed by Delora Fuel ([id number]) on 03/17/2020 2:36:06 AM   Radiology CT ABDOMEN PELVIS W CONTRAST  Result Date: 03/17/2020 CLINICAL DATA:  Acute generalized abdominal pain.  EXAM: CT ABDOMEN AND PELVIS WITH CONTRAST TECHNIQUE: Multidetector CT imaging of the abdomen and pelvis was performed using the standard protocol following bolus administration of intravenous contrast. CONTRAST:  122mL OMNIPAQUE IOHEXOL 300 MG/ML  SOLN COMPARISON:  March 02, 2020.  Jan 31, 2020.  March 5th 2021. FINDINGS: Lower chest: No acute abnormality. Hepatobiliary: No focal liver abnormality is seen. No gallstones, gallbladder wall thickening, or biliary dilatation. Pancreas: No ductal dilatation is noted. However, rounded low density is noted in the pancreatic head which appears to be enlarged compared to prior exam, currently measuring 31 x 26 mm. 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 Clinical and laboratory correlation is recommended. Spleen: Normal in size without focal abnormality. Adrenals/Urinary Tract: Adrenal glands appear normal. Small left renal cysts are noted. No hydronephrosis or renal obstruction is noted. No renal or ureteral calculi are noted. Urinary bladder is unremarkable. Stomach/Bowel: Stomach is within normal limits. Appendix appears normal. No evidence of bowel wall thickening, distention, or inflammatory changes. Vascular/Lymphatic: Grossly stable 3.3 cm infrarenal abdominal aortic aneurysm is noted. No adenopathy is noted. Reproductive: Uterus and bilateral adnexa are unremarkable. Other: No abdominal wall hernia or abnormality. No abdominopelvic ascites. Musculoskeletal: There appears to be lytic destruction involving the greater trochanter of the proximal right femur with associated pathologic fracture. This is concerning for possible malignancy or metastatic disease. IMPRESSION: 1. There appears to be lytic destruction involving the greater trochanter of the proximal right femur with associated pathologic fracture. This is concerning for possible malignancy or metastatic disease. MRI or bone scan is recommended  for further evaluation. 2. Rounded low density is noted in the pancreatic head which appears to be enlarged compared to prior exam, currently measuring 31 x 26 mm. 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 Clinical and laboratory correlation is recommended, and MRI also should be considered to rule out potential neoplasm or malignancy. 3. Grossly stable 3.3 cm infrarenal abdominal aortic aneurysm. Recommend followup by ultrasound in 3 years. This recommendation follows ACR consensus guidelines: White Paper of the ACR Incidental Findings Committee II on Vascular Findings. J Am Coll Radiol 2013; 10:789-794. Aortic Atherosclerosis (ICD10-I70.0). Electronically Signed   By: Marijo Conception M.D.   On: 03/17/2020 11:33   DG Chest Port 1 View  Result Date: 03/17/2020 CLINICAL DATA:  Sepsis. EXAM: PORTABLE CHEST 1 VIEW COMPARISON:  None. FINDINGS: The heart size and mediastinal contours are within normal limits. Sternotomy wires are noted. No pneumothorax or pleural effusion is noted. Both lungs are clear. The visualized skeletal structures are unremarkable. IMPRESSION: No active disease. Electronically Signed   By: Marijo Conception M.D.   On: 03/17/2020 08:28    Procedures Procedures (including critical care time)  Medications Ordered in ED Medications  cefTRIAXone (ROCEPHIN) 1  g in sodium chloride 0.9 % 100 mL IVPB (has no administration in time range)  morphine 4 MG/ML injection 4 mg (has no administration in time range)  lactated ringers bolus 1,000 mL (has no administration in time range)  sodium chloride flush (NS) 0.9 % injection 3 mL (3 mLs Intravenous Given 03/17/20 0829)  ondansetron (ZOFRAN-ODT) disintegrating tablet 4 mg (4 mg Oral Given 03/17/20 0225)  lactated ringers bolus 1,000 mL (0 mLs Intravenous Stopped 03/17/20 0951)  morphine 4 MG/ML injection 4 mg (4 mg Intravenous Given 03/17/20 0834)  potassium chloride 10 mEq in 100  mL IVPB (10 mEq Intravenous New Bag/Given 03/17/20 1155)  iohexol (OMNIPAQUE) 300 MG/ML solution 100 mL (100 mLs Intravenous Contrast Given 03/17/20 1036)    ED Course  I have reviewed the triage vital signs and the nursing notes.  Pertinent labs & imaging results that were available during my care of the patient were reviewed by me and considered in my medical decision making (see chart for details).    MDM Rules/Calculators/A&P                         Patient is a 59 year old woman who presents today for evaluation of abdominal pain since last night.  On exam she is tender in bilateral and midline upper abdomen.  She has a longstanding history of pancreatitis, says that this feels very similar.  Labs obtained and reviewed, lipase is markedly elevated at 6271.  CBC is significant for white count of 16.8 and hemoglobin elevated at 15.4 which I suspect is secondary to hemoconcentration.  CMP shows hypokalemia with a potassium of 3.  IV replacement ordered.   Given history of pancreatitis and reporting pain feeling similar with elevated lipase suspect pancreatitis.  Based on tachycardia and severity of her pain CT scanning obtained showing pancreatic cyst which is known.  It also shows concern for a pathologic fracture.  Chart review shows that she had a fall that may have contributed to the fracture.  Hospitalist is aware at the time of admission I had not discussed this result with patient.   Given tachycardia and leukocytosis blood cultures were sent.  Patients meets SIRS criteria but no clear infection, suspect secondary to pain and dehydration.  After discussion with Dr. Roxanne Mins will hold antibiotics.    UA resulted showing concern for infection with many bacteria, 6-10 white cells.  She is treated with IV antibiotics after infectious source discovered.    Hospitalist is consulted, I spoke with Dr. Nicole Kindred who will see patient for admission.   This patient was seen as a shared visit with  Dr. Roxanne Mins.    Note: Portions of this report may have been transcribed using voice recognition software. Every effort was made to ensure accuracy; however, inadvertent computerized transcription errors may be present  Final Clinical Impression(s) / ED Diagnoses Final diagnoses:  Acute on chronic pancreatitis ([place])  Abdominal aortic aneurysm (AAA) without rupture ([place])  Hypokalemia  Acute cystitis without hematuria    Rx / DC Orders ED Discharge Orders    None       Ollen Gross 46/96/29 [id number]    Delora Fuel, MD 13/24/40 2054

## 2020-03-17 NOTE — H&P (Addendum)
History and Physical    [name] KYH:[id number] DOB: 21-Jan-1961 DOA: 03/17/2020  PCP: [name], DO  Patient coming from: Home  I have personally briefly reviewed patient's old medical records in [place]  Chief Complaint: Abdominal pain  HPI: [name] is a 59 y.o. female with medical history significant of recurrent pancreatitis, paroxysmal A. fib, CAD status post CABG, HTN, anxiety, CVA in March of this year, AAA stable on surveillance who presented to the ED this morning with persistent epigastric abdominal pain.  Patient reports associated severe nausea with occasional vomiting, nonbloody and nonbilious.  She reports some episodes of diaphoresis but no fevers or chills.  She reports that she has been taking Flomax in order to pass a kidney stone that she was last told his under the bladder.  Was having hematuria, but this has resolved.  Husband is at bedside and assists with history, as patient is quite drowsy from pain meds.  He states that this is her fifth or sixth episode of acute pancreatitis since initial diagnosis about 6 or 7 months ago.    Regarding her right hip, patient had a fall in early March.  Imaging at that time was negative for fracture.  Patient was requiring a cane for ambulation after this, but she has more recently been ambulating without assist device.  She denies hip pain at this time.   ED Course: Afebrile, mildly tachycardic, BP 136/96.  CBC notable for leukocytosis 16.8, hemoconcentration with hemoglobin 15.4.  BMP was notable for potassium of 3.0 and glucose 138.  Lipase elevated 6271.  Urinalysis was consistent with infection and reflex to culture.  Chest x-ray negative.  CT of the abdomen and pelvis showed a rounded density in the pancreatic head which is increased in size rapidly since prior exam on May 7, but is thought to represent pancreatic pseudocyst (MRI recommended for better characterization), stable 3.3 cm infrarenal AAA, and  what appears to be lytic destruction of the right greater trochanter with associated pathologic fracture.    Patient is admitted to hospitalist service for further evaluation and management.  GI is consulted.  Review of Systems: As per HPI otherwise 10 point review of systems negative.    Past Medical History:  Diagnosis Date  . Angio-edema   . Arthritis   . CAD (coronary artery disease)    a. s/p NSTEMI in 10/2017 with cath showing LM disease --> s/p CABG on 11/23/2017 with LIMA-LAD and SVG-OM.   [name] Hay fever   . Ischemic cardiomyopathy    a. EF 35-40% by echo in 10/2017  . NSTEMI (non-ST elevated myocardial infarction) ([place])   . PAF (paroxysmal atrial fibrillation) ([place])    Diagnosed by ILR  . Pancreatitis   . Pneumonia 11/16/2017  . Stroke [facility]) 08/23/2017   Left MCA infarct status post TPA and thrombectomy  . Urticaria     Past Surgical History:  Procedure Laterality Date  . BIOPSY  12/02/2019   Procedure: BIOPSY;  Surgeon: Rush Landmark Telford Nab., MD;  Location: [place];  Service: Gastroenterology;;  . CORONARY ARTERY BYPASS GRAFT N/A 11/23/2017   Procedure: CORONARY ARTERY BYPASS GRAFTING (CABG) x 2, ON PUMP, USING LEFT INTERNAL MAMMARY ARTERY AND RIGHT GREATER SAPHENOUS VEIN HARVESTED ENDOSCOPICALLY;  Surgeon: Gaye Pollack, MD;  Location: [place];  Service: Open Heart Surgery;  Laterality: N/A;  . CRYOABLATION     cervical  . ESOPHAGOGASTRODUODENOSCOPY (EGD) WITH PROPOFOL N/A 12/02/2019   Procedure: ESOPHAGOGASTRODUODENOSCOPY (EGD) WITH PROPOFOL;  Surgeon: Irving Copas., MD;  Location: [place];  Service: Gastroenterology;  Laterality: N/A;  . FINE NEEDLE ASPIRATION  12/02/2019   Procedure: FINE NEEDLE ASPIRATION (FNA) LINEAR;  Surgeon: Irving Copas., MD;  Location: [place];  Service: Gastroenterology;;  . IR ANGIO INTRA EXTRACRAN SEL COM CAROTID INNOMINATE UNI R MOD SED  08/21/2017  . IR ANGIO VERTEBRAL SEL SUBCLAVIAN INNOMINATE UNI R MOD SED   08/21/2017  . IR PERCUTANEOUS ART THROMBECTOMY/INFUSION INTRACRANIAL INC DIAG ANGIO  08/21/2017  . IR RADIOLOGIST EVAL & MGMT  10/30/2017  . LOOP RECORDER INSERTION N/A 08/28/2017   Procedure: LOOP RECORDER INSERTION;  Surgeon: Constance Haw, MD;  Location: [place] CV LAB;  Service: Cardiovascular;  Laterality: N/A;  . RADIOLOGY WITH ANESTHESIA N/A 08/21/2017   Procedure: RADIOLOGY WITH ANESTHESIA;  Surgeon: Luanne Bras, MD;  Location: [place];  Service: Radiology;  Laterality: N/A;  . RIGHT/LEFT HEART CATH AND CORONARY ANGIOGRAPHY N/A 11/20/2017   Procedure: RIGHT/LEFT HEART CATH AND CORONARY ANGIOGRAPHY;  Surgeon: Jettie Booze, MD;  Location: [place] [facility];  Service: Cardiovascular;  Laterality: N/A;  . TEE WITHOUT CARDIOVERSION N/A 08/28/2017   Procedure: TRANSESOPHAGEAL ECHOCARDIOGRAM (TEE);  Surgeon: Fay Records, MD;  Location: [place];  Service: Cardiovascular;  Laterality: N/A;  . TEE WITHOUT CARDIOVERSION N/A 11/23/2017   Procedure: TRANSESOPHAGEAL ECHOCARDIOGRAM (TEE);  Surgeon: Gaye Pollack, MD;  Location: [place];  Service: Open Heart Surgery;  Laterality: N/A;  . TUBAL LIGATION    . UPPER ESOPHAGEAL ENDOSCOPIC ULTRASOUND (EUS) N/A 12/02/2019   Procedure: UPPER ESOPHAGEAL ENDOSCOPIC ULTRASOUND (EUS);  Surgeon: Irving Copas., MD;  Location: [place];  Service: Gastroenterology;  Laterality: N/A;     reports that she has been smoking cigarettes. She has a 3.70 pack-year smoking history. She has never used smokeless tobacco. She reports that she does not drink alcohol and does not use drugs.  Allergies  Allergen Reactions  . Lopressor [Metoprolol Tartrate] Swelling    Angioedema  . Allegra Allergy [Fexofenadine Hcl] Hives  . Dilaudid [Hydromorphone]     Hallucinations  . Nsaids     On Xarelto  . Gabapentin (Once-Daily) Rash  . Lipitor [Atorvastatin] Diarrhea    Family History  Problem Relation Age of Onset  . Heart attack Father     . Hypertension Mother   . Hyperlipidemia Mother   . Diabetes type II Mother   . Urticaria Mother   . Hypertension Brother   . Breast cancer Cousin 49  . Immunodeficiency Neg Hx   . Eczema Neg Hx   . Atopy Neg Hx   . Asthma Neg Hx   . Angioedema Neg Hx   . Allergic rhinitis Neg Hx   . Colon cancer Neg Hx   . Esophageal cancer Neg Hx   . Stomach cancer Neg Hx   . Pancreatic cancer Neg Hx   . Colon polyps Neg Hx   . Esophageal varices Neg Hx   . Inflammatory bowel disease Neg Hx   . Rectal cancer Neg Hx      Prior to Admission medications   Medication Sig Start Date End Date Taking? Authorizing Provider  acetaminophen (TYLENOL) 325 MG tablet Take 2 tablets (650 mg total) by mouth every 6 (six) hours as needed for mild pain or moderate pain (or Fever >/= 101). 09/13/19  Yes Rai, Ripudeep K, MD  azelastine (ASTELIN) 0.1 % nasal spray Place 1-2 sprays into both nostrils 2 (two) times daily as needed for rhinitis.  Use in each nostril as directed 09/17/19  Yes Bobbitt, [place] Muta, MD  baclofen (LIORESAL) 10 MG tablet Take 10 mg by mouth in the morning and at bedtime.   Yes [provider]  calcium carbonate (TUMS - DOSED IN MG ELEMENTAL CALCIUM) 500 MG chewable tablet Chew 1 tablet by mouth 2 (two) times daily.    Yes [provider]  carvedilol (COREG) 3.125 MG tablet Take 1 tablet (3.125 mg total) by mouth in the morning and at bedtime. 03/16/20  Yes Satira Sark, MD  cholecalciferol (VITAMIN D) 1000 units tablet Take 1,000 Units by mouth daily.   Yes [provider]  famotidine (PEPCID) 20 MG tablet Take 1 tablet (20 mg total) by mouth 2 (two) times daily. 12/06/19  Yes [name], [name], DO  ferrous sulfate 325 (65 FE) MG tablet Take 325 mg by mouth 2 (two) times a week. Mondays  and Thursdays.   Yes [provider]  hydrOXYzine (ATARAX/VISTARIL) 10 MG tablet Take 1 tablet (10 mg total) by mouth at bedtime. 12/06/19  Yes [name], [name], DO   levocetirizine (XYZAL) 5 MG tablet Take 1 tablet (5 mg total) by mouth every evening. 12/06/19  Yes [name], [name], DO  omeprazole (PRILOSEC) 40 MG capsule Take 1 capsule (40 mg total) by mouth daily. 12/06/19  Yes [name], [name], DO  ondansetron (ZOFRAN ODT) 4 MG disintegrating tablet Take 1 tablet (4 mg total) by mouth every 8 (eight) hours as needed for nausea or vomiting. 12/04/19  Yes Rai, Ripudeep K, MD  pantoprazole (PROTONIX) 40 MG tablet Take 40 mg by mouth daily. 03/03/20  Yes [provider]  pravastatin (PRAVACHOL) 20 MG tablet Take 1 tablet (20 mg total) by mouth every evening. 02/18/20  Yes Strader, [place] M, PA-C  rivaroxaban (XARELTO) 20 MG TABS tablet Take 1 tablet daily with evening meal Patient taking differently: Take 20 mg by mouth daily with supper.  09/25/19  Yes Satira Sark, MD  tamsulosin (FLOMAX) 0.4 MG CAPS capsule Take 1 capsule (0.4 mg total) by mouth daily. 03/03/20  Yes [name], [name], DO  traMADol (ULTRAM) 50 MG tablet TAKE 1 TO 2 TABLETS BY MOUTH EVERY 8 HOURS AS NEEDED FOR MODERATE PAIN Patient taking differently: Take 50-100 mg by mouth every 8 (eight) hours as needed for moderate pain.  03/09/20  Yes [name], [name], DO  vitamin B-12 (CYANOCOBALAMIN) 100 MCG tablet Take 100 mcg by mouth daily.   Yes [provider]    Physical Exam: Vitals:   03/17/20 1400 03/17/20 1430 03/17/20 1500 03/17/20 1550  BP: (!) 148/93 (!) 150/87  136/85  Pulse: (!) 110 (!) 106 (!) 119 (!) 117  Resp: 13 13 (!) 28 (!) 27  Temp:      TempSrc:      SpO2: 95% 96% 98% 94%  Weight:      Height:        Constitutional: NAD, calm, comfortable, drowsy Eyes: Pinpoint pupils, EOMI, lids and conjunctivae normal ENMT: Mucous membranes are moist.  Hearing grossly normal. Respiratory: CTAB, no wheezing, no crackles. Normal respiratory effort. No accessory muscle use.  Cardiovascular: RRR, no murmurs / rubs / gallops. No extremity edema. 2+ pedal pulses.   Abdomen:  soft, tenderness on palpation across the upper abdomen, no rebound tenderness. +Bowel sounds.  Musculoskeletal: no clubbing / cyanosis. No joint deformity upper and lower extremities. Normal muscle tone.  Skin: dry, intact, normal color, normal temperature Neurologic: CN 2-12 grossly intact.  Normal speech.  Grossly non-focal exam. Psychiatric: Oriented x 3. Normal mood. Congruent affect.  Normal judgement and insight.   Labs on Admission: I have personally reviewed following labs and imaging studies  CBC: Recent Labs  Lab 03/17/20 0231  WBC 16.8*  HGB 15.4*  HCT 47.5*  MCV 98.8  PLT 536   Basic Metabolic Panel: Recent Labs  Lab 03/17/20 0231 03/17/20 0817  NA 138  --   K 3.0*  --   CL 103  --   CO2 27  --   GLUCOSE 138*  --   BUN 11  --   CREATININE 0.58  --   CALCIUM 8.8*  --   MG  --  2.2   GFR: Estimated Creatinine Clearance: 54.4 mL/min (by C-G formula based on SCr of 0.58 mg/dL). Liver Function Tests: Recent Labs  Lab 03/17/20 0231  AST 19  ALT 15  ALKPHOS 70  BILITOT 0.4  PROT 7.2  ALBUMIN 4.2   Recent Labs  Lab 03/17/20 0231  LIPASE 6,271*   No results for input(s): AMMONIA in the last 168 hours. Coagulation Profile: Recent Labs  Lab 03/17/20 0817  INR 1.2   Cardiac Enzymes: No results for input(s): CKTOTAL, CKMB, CKMBINDEX, TROPONINI in the last 168 hours. BNP (last 3 results) No results for input(s): PROBNP in the last 8760 hours. HbA1C: No results for input(s): HGBA1C in the last 72 hours. CBG: No results for input(s): GLUCAP in the last 168 hours. Lipid Profile: No results for input(s): CHOL, HDL, LDLCALC, TRIG, CHOLHDL, LDLDIRECT in the last 72 hours. Thyroid Function Tests: No results for input(s): TSH, T4TOTAL, FREET4, T3FREE, THYROIDAB in the last 72 hours. Anemia Panel: No results for input(s): VITAMINB12, FOLATE, FERRITIN, TIBC, IRON, RETICCTPCT in the last 72 hours. Urine analysis:    Component Value Date/Time   COLORURINE  YELLOW 03/17/2020 0948   APPEARANCEUR HAZY (A) 03/17/2020 0948   LABSPEC 1.011 03/17/2020 0948   PHURINE 7.0 03/17/2020 0948   GLUCOSEU NEGATIVE 03/17/2020 0948   HGBUR NEGATIVE 03/17/2020 0948   BILIRUBINUR NEGATIVE 03/17/2020 0948   BILIRUBINUR negative 02/18/2020 1106   KETONESUR 20 (A) 03/17/2020 0948   PROTEINUR 30 (A) 03/17/2020 0948   UROBILINOGEN 0.2 02/18/2020 1106   NITRITE NEGATIVE 03/17/2020 0948   LEUKOCYTESUR NEGATIVE 03/17/2020 0948    Radiological Exams on Admission: CT ABDOMEN PELVIS W CONTRAST  Result Date: 03/17/2020 CLINICAL DATA:  Acute generalized abdominal pain. EXAM: CT ABDOMEN AND PELVIS WITH CONTRAST TECHNIQUE: Multidetector CT imaging of the abdomen and pelvis was performed using the standard protocol following bolus administration of intravenous contrast. CONTRAST:  158mL OMNIPAQUE IOHEXOL 300 MG/ML  SOLN COMPARISON:  March 02, 2020.  Jan 31, 2020.  March 5th 2021. FINDINGS: Lower chest: No acute abnormality. Hepatobiliary: No focal liver abnormality is seen. No gallstones, gallbladder wall thickening, or biliary dilatation. Pancreas: No ductal dilatation is noted. However, rounded low density is noted in the pancreatic head which appears to be enlarged compared to prior exam, currently measuring 31 x 26 mm. 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 Clinical and laboratory correlation is recommended. Spleen: Normal in size without focal abnormality. Adrenals/Urinary Tract: Adrenal glands appear normal. Small left renal cysts are noted. No hydronephrosis or renal obstruction is noted. No renal or ureteral calculi are noted. Urinary bladder is unremarkable. Stomach/Bowel: Stomach is within normal limits. Appendix appears normal. No evidence of bowel wall thickening, distention, or inflammatory changes.  Vascular/Lymphatic: Grossly stable 3.3 cm infrarenal abdominal aortic aneurysm is noted. No adenopathy is  noted. Reproductive: Uterus and bilateral adnexa are unremarkable. Other: No abdominal wall hernia or abnormality. No abdominopelvic ascites. Musculoskeletal: There appears to be lytic destruction involving the greater trochanter of the proximal right femur with associated pathologic fracture. This is concerning for possible malignancy or metastatic disease. IMPRESSION: 1. There appears to be lytic destruction involving the greater trochanter of the proximal right femur with associated pathologic fracture. This is concerning for possible malignancy or metastatic disease. MRI or bone scan is recommended for further evaluation. 2. Rounded low density is noted in the pancreatic head which appears to be enlarged compared to prior exam, currently measuring 31 x 26 mm. 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 Clinical and laboratory correlation is recommended, and MRI also should be considered to rule out potential neoplasm or malignancy. 3. Grossly stable 3.3 cm infrarenal abdominal aortic aneurysm. Recommend followup by ultrasound in 3 years. This recommendation follows ACR consensus guidelines: White Paper of the ACR Incidental Findings Committee II on Vascular Findings. J Am Coll Radiol 2013; 10:789-794. Aortic Atherosclerosis (ICD10-I70.0). Electronically Signed   By: Marijo Conception M.D.   On: 03/17/2020 11:33   DG Chest Port 1 View  Result Date: 03/17/2020 CLINICAL DATA:  Sepsis. EXAM: PORTABLE CHEST 1 VIEW COMPARISON:  None. FINDINGS: The heart size and mediastinal contours are within normal limits. Sternotomy wires are noted. No pneumothorax or pleural effusion is noted. Both lungs are clear. The visualized skeletal structures are unremarkable. IMPRESSION: No active disease. Electronically Signed   By: Marijo Conception M.D.   On: 03/17/2020 08:28     Assessment/Plan Principal Problem:   Acute on chronic pancreatitis (HCC) Active  Problems:   Hypokalemia   Cerebrovascular accident (CVA) due to occlusion of left middle cerebral artery (HCC)   Hyperlipidemia LDL goal <70   Benign essential HTN   Chronic diastolic CHF (congestive heart failure) (HCC)   Paroxysmal atrial fibrillation (HCC)   Anxiety    Acute on chronic pancreatitis -present on admission with epigastric abdominal pain, nausea, vomiting and lipase 6271, CT findings show enlargement of prior pancreatic pseudocyst (presumed cyst, malignancy remains in differential).  Multiple episodes of pancreatitis in the past 6 to 7 months.  Negative autoimmune work-up.  Status post EUS with FNA showing atypical cells attributed to inflammation at the time.  Does have gallbladder sludge. --N.p.o. except sips with meds, ice chips --IV fluids per orders --Pain control and bowel regimen --GI is following --Consider MRI for further characterization of mass presumed to be pseudocyst.  Hypokalemia -POA, K3.0.  Replacing with IV.  BMP in a.m. further replacement as needed.  Goal K >4.0.  Check mag level, replace as needed, goal Mg >2.0.  Proximal right femur fracture / ?lytic lesion - patient suffered a fall in March, apparently in the setting of her CVA.  Imaging was negative for fracture at that time.  CT abdomen pelvis today shows possibly lytic lesion of the proximal right femur with pathologic fracture.  Patient is ambulating without assist device and does not complain of pain.  Malignancy evaluation is warranted.  Leukocytosis - POA with WBC 16.8k.  Afebrile.  UA questionable for infection but patient without GU complaints.  Currently no signs of infection, but will monitor closely.  Defer antibiotics for now.  Leukocytosis could be reactive in setting of pancreatitis.  Kidney stone -  recently with kidney stone, on Flomax.  Not in acute pain.  No hematuria.   History of gastritis/duodenitis -hold oral Pepcid and Protonix.  IV Protonix twice daily for now.  CVA due to  occlusion of Left MCA -in March of this year.  No acute neurologic changes upon admission.  Continue statin and Xarelto. Monitor neuro status.  Hyperlipidemia - hold statin for now.  Essential HTN -chronic, currently uncontrolled likely due to pain.  Continue Coreg.  As needed IV hydralazine.  Chronic diastolic CHF -euvolemic on admission.  Echo from last month showed EF 55% and grade 1 diastolic dysfunction.  Paroxysmal atrial fibrillation -rate controlled at time of admission.  Continue Coreg and Xarelto.  PRN IV Lopressor for rate control.  Anxiety - continue home Vistaril  Infrarenal AAA -grossly stable on imaging at 3.3 cm.  Recommendation for surveillance is repeat ultrasound in 3 years.  DVT prophylaxis: enoxaparin (LOVENOX) injection 40 mg Start: 03/17/20 1600   Code Status: Full Family Communication: Husband at bedside during encounter Disposition Plan: Discharge home pending clinical improvement and clearance by GI Consults called: Gastroenterology  Admission status:  Status is: Inpatient  Remains inpatient appropriate because:IV treatments appropriate due to intensity of illness or inability to take PO   Dispo: The patient is from: Home              Anticipated d/c is to: Home              Anticipated d/c date is: at least 3 days              Patient currently is not medically stable to d/c.    Ezekiel Slocumb, DO [facility]  03/17/2020, 6:05 PM    If 7PM-7AM, please contact night-coverage. How to contact the [facility] Attending or Consulting provider [place] or covering provider during after hours [place], for this patient?    1. Check the care team in [facility] and look for a) attending/consulting [facility] provider listed and b) the [facility] team listed 2. Log into [email or web address] and use [place]'s universal password to access. If you do not have the password, please contact the hospital operator. 3. Locate the [facility] provider you are looking for under [facility]  and page to a number that you can be directly reached. 4. If you still have difficulty reaching the provider, please page the [facility] (Director on Call) for the Hospitalists listed on amion for assistance.

## 2020-03-17 NOTE — ED Notes (Signed)
Pt ambulating to restroom unassisted.

## 2020-03-17 NOTE — Consult Note (Signed)
Referring Provider:  [facility]         Primary Care Physician:  [name], DO Primary Gastroenterologist:   Gerrit Heck, MD  .         We were asked to see this patient for:     Pancreatitis.              ASSESSMENT /  PLAN    [name] is a 59 y.o. female PMH significant for, but not necessarily limited to, CVA, CAD/CABG, CHF, atrial fibrillation, pancreatitis, infrarenal aortic aneurysm, 3.4 cm with irregular mural thrombus   # Recurrent, acute pancreatitis / pseudocyst.  --Low density lesion in HOP, enlarged compared to previous exam. Felt to represent pseudocyst given rapid increase in size.   --Multiple episodes of pancreatitis. --Autoimmune evaluation negative --Suspicious medications stopped at time of previous episodes --Status post EUS with FNA showing atypical cells but felt to be secondary to inflammation.  --She does have gallbladder sludge seen on EUS.  Consider empiric cholecystectomy --Hemoconcentration. She has gotten 2 L of lactated Ringer's and currently has fluids going at 100 mL an hour.  However, hematocrit even higher at 47.5 this a.m.  I am going to increase her maintenance rate to 200 mL an hour for the next several hours.  --A.m. labs -Supportive measures with analgesics, antiemetics.  # Infrarenal aortic aneurysm --grossly stable at 3.3 cm.   # ? Lytic lesion involving the greater trochanter of the proximal right femur with associated pathologic fracture. This is concerning for possible malignancy or metastatic disease. MRI or bone scan is recommended for further evaluation  HPI:    Chief Complaint: abdominal pain    [name] is a 59 y.o. 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 -CT notable for 2.2 cm cyst versus pseudocyst in the head of pancreas.   10/14/19 Readmitted with acute recurrent pancreatitis. CT scan   abd/ pelvis with contrast -. Acute pancreatitis. Cystic lesion in the pancreatic head measures 1.8 x 1.7 x 1.8 cm, previously 2.2 x 1.9 x 2.0 cm on CT 1 month ago, favoring pseudocyst with slight improvement.. Wall thickening of the pre pyloric stomach and proximal duodenum, may be in part reactive. --Negative IgG subclass4 -Lasix stopped  11/07/19 Readmitted with acute recurrent pancreatitis -Lipase >10k, improved to 837 then 74 by d/c -WBC 17.5, hemoconcentration, normal BUN/creatinine -Blood cultures negative -Admission CT interval with worsening of peripancreatic phlegmon and edema .  Stable cystic lesion in the head of the pancreas , persistent choledocholithiasis with a calculi in the proximal cystic duct , increasing cystic duct dilation mural enhancement and inflammation worrisome for developing cholangitis .persistent prepyloric hyperenhancement and mural thickening of the stomach likely secondary to pancreatic inflammation . -RUQ [place]: No gallstones or bile duct dilation.  Hypoechoic pancreatic head lesion consistent with cystic mass noted on previous CT scan.  -MRI/MRCP 09/05/2020: Hepatic steatosis, mild GB distention, focally ectatic CBD measuring 8 mm without CDL.  Pancreatic edema c/w AP, 1.8 x 1.3 cm heterogenous structure (favoring pseudocyst), previously 1.8 x 1.9 cm.  Cystic duct stones not appreciated. -Liver enzymes remained normal, but consideration for microlithiasis as etiology -Stopped Entresto   11/11/19  Office visit -hospital follow-up.  Abdominal pain improved.  Etiology of pancreatitis still unclear but was possibly related to [facility].  Plan was for EUS to evaluate for microlithiasis.  Consider empiric cholecystectomy.  March 2021 EUS -see full  results below.  Sludge found in gallbladder, no cholelithiasis.  Cystic lesion in the peripancreatic region, fine-needle aspiration.  Area of hypodensity in the pancreatic head, FNA.  Pancreatic head FNA showed atypical  cells  01/24/2020 office visit with [name] Landmark -has been doing well since her prior hospitalization.  Pancreatic FNA felt to be consistent with an inflammatory process as patient had acute pancreatitis again just 3 days prior to EUS.. Plan was for dedicated pancreatic protocol CT scan prior to repeating an EUS.  01/31/2020 CT scan  abd/ pelvis with contrast --Cholelithiasis.  No biliary duct dilation.  Decrease in peripancreatic inflammatory changes.  Decreased size of peripancreatic head mass since imaging on 11/29/2019,  presumably a pseudocyst.  Plan was to hold off on repeat EUS.  Consider empiric cholecystectomy in the setting of cholelithiasis and previous idiopathic episodes of pancreatitis.  Plan was for follow-up MRI/MRCP in 4 to 6 months.. Regarding AAA, CT scan results sent to patient's vascular surgeon, Dr. Donnetta Hutching    INTERVAL HISTORY:   Patient started feeling some fatigue yesterday.  Last night she developed recurrent , severe generalized abdominal pain and vomiting.  In the ED her lipase was 6271, WBC 16.8, hemoglobin 15.4, hematocrit 47.  The pain seems to start in the left mid abdomen with eventual spread to the whole abdomen but not into her back.  This is the same pain as previous episodes of pancreatitis.  Pain is constant, unrelated to eating.  No other GI complaints.   PREVIOUS ENDOSCOPIC EVALUATIONS / GI STUDIES : March 2021 EUS : - An area of hypoechogenicity was identified in the pancreatic head. Fine needle biopsy performed to exclude malignancy. - A cystic lesion was seen in the peripancreatic region - seemed to be coming from the duodenal wall. Fine needle aspiration for fluid performed. Query groove pancreatitis. - There was no sign of significant pathology in the common bile duct and in the common hepatic duct. - Hyperechoic material consistent with sludge was visualized endosonographically in the gallbladder but no stones. - No malignant-appearing lymph nodes were  visualized in the celiac region (level 20), peripancreatic region and porta hepatis region. Pathology FINAL MICROSCOPIC DIAGNOSIS:  A. PANCREAS, HEAD, FINE NEEDLE ASPIRATION:  - Atypical cells present.  - See comment.  COMMENT:  There are numerous benign glandular cells and occasional fragments with slight nuclear enlargement. A neoplastic process cannot be ruled out.     Past Medical History:  Diagnosis Date  . Angio-edema   . Arthritis   . CAD (coronary artery disease)    a. s/p NSTEMI in 10/2017 with cath showing LM disease --> s/p CABG on 11/23/2017 with LIMA-LAD and SVG-OM.   [name] Hay fever   . Ischemic cardiomyopathy    a. EF 35-40% by echo in 10/2017  . NSTEMI (non-ST elevated myocardial infarction) ([place])   . PAF (paroxysmal atrial fibrillation) ([place])    Diagnosed by ILR  . Pancreatitis   . Pneumonia 11/16/2017  . Stroke [facility]) 08/23/2017   Left MCA infarct status post TPA and thrombectomy  . Urticaria     Past Surgical History:  Procedure Laterality Date  . BIOPSY  12/02/2019   Procedure: BIOPSY;  Surgeon: Rush Landmark Telford Nab., MD;  Location: [place];  Service: Gastroenterology;;  . CORONARY ARTERY BYPASS GRAFT N/A 11/23/2017   Procedure: CORONARY ARTERY BYPASS GRAFTING (CABG) x 2, ON PUMP, USING LEFT INTERNAL MAMMARY ARTERY AND RIGHT GREATER SAPHENOUS VEIN HARVESTED ENDOSCOPICALLY;  Surgeon: Gaye Pollack, MD;  Location: [place];  Service: Open Heart Surgery;  Laterality: N/A;  . CRYOABLATION     cervical  . ESOPHAGOGASTRODUODENOSCOPY (EGD) WITH PROPOFOL N/A 12/02/2019   Procedure: ESOPHAGOGASTRODUODENOSCOPY (EGD) WITH PROPOFOL;  Surgeon: Rush Landmark Telford Nab., MD;  Location: [place];  Service: Gastroenterology;  Laterality: N/A;  . FINE NEEDLE ASPIRATION  12/02/2019   Procedure: FINE NEEDLE ASPIRATION (FNA) LINEAR;  Surgeon: Irving Copas., MD;  Location: [place];  Service: Gastroenterology;;  . IR ANGIO INTRA EXTRACRAN SEL COM CAROTID INNOMINATE  UNI R MOD SED  08/21/2017  . IR ANGIO VERTEBRAL SEL SUBCLAVIAN INNOMINATE UNI R MOD SED  08/21/2017  . IR PERCUTANEOUS ART THROMBECTOMY/INFUSION INTRACRANIAL INC DIAG ANGIO  08/21/2017  . IR RADIOLOGIST EVAL & MGMT  10/30/2017  . LOOP RECORDER INSERTION N/A 08/28/2017   Procedure: LOOP RECORDER INSERTION;  Surgeon: Constance Haw, MD;  Location: [place] [facility];  Service: Cardiovascular;  Laterality: N/A;  . RADIOLOGY WITH ANESTHESIA N/A 08/21/2017   Procedure: RADIOLOGY WITH ANESTHESIA;  Surgeon: Luanne Bras, MD;  Location: [place];  Service: Radiology;  Laterality: N/A;  . RIGHT/LEFT HEART CATH AND CORONARY ANGIOGRAPHY N/A 11/20/2017   Procedure: RIGHT/LEFT HEART CATH AND CORONARY ANGIOGRAPHY;  Surgeon: Jettie Booze, MD;  Location: [place] [facility];  Service: Cardiovascular;  Laterality: N/A;  . TEE WITHOUT CARDIOVERSION N/A 08/28/2017   Procedure: TRANSESOPHAGEAL ECHOCARDIOGRAM (TEE);  Surgeon: Fay Records, MD;  Location: [place];  Service: Cardiovascular;  Laterality: N/A;  . TEE WITHOUT CARDIOVERSION N/A 11/23/2017   Procedure: TRANSESOPHAGEAL ECHOCARDIOGRAM (TEE);  Surgeon: Gaye Pollack, MD;  Location: [place];  Service: Open Heart Surgery;  Laterality: N/A;  . TUBAL LIGATION    . UPPER ESOPHAGEAL ENDOSCOPIC ULTRASOUND (EUS) N/A 12/02/2019   Procedure: UPPER ESOPHAGEAL ENDOSCOPIC ULTRASOUND (EUS);  Surgeon: Irving Copas., MD;  Location: [place];  Service: Gastroenterology;  Laterality: N/A;    Prior to Admission medications   Medication Sig Start Date End Date Taking? Authorizing Provider  acetaminophen (TYLENOL) 325 MG tablet Take 2 tablets (650 mg total) by mouth every 6 (six) hours as needed for mild pain or moderate pain (or Fever >/= 101). 09/13/19  Yes Rai, Ripudeep K, MD  azelastine (ASTELIN) 0.1 % nasal spray Place 1-2 sprays into both nostrils 2 (two) times daily as needed for rhinitis. Use in each nostril as directed 09/17/19  Yes Bobbitt,  Sedalia Muta, MD  baclofen (LIORESAL) 10 MG tablet Take 10 mg by mouth in the morning and at bedtime.   Yes [provider]  calcium carbonate (TUMS - DOSED IN MG ELEMENTAL CALCIUM) 500 MG chewable tablet Chew 1 tablet by mouth 2 (two) times daily.    Yes [provider]  carvedilol (COREG) 3.125 MG tablet Take 1 tablet (3.125 mg total) by mouth in the morning and at bedtime. 03/16/20  Yes Satira Sark, MD  cholecalciferol (VITAMIN D) 1000 units tablet Take 1,000 Units by mouth daily.   Yes [provider]  famotidine (PEPCID) 20 MG tablet Take 1 tablet (20 mg total) by mouth 2 (two) times daily. 12/06/19  Yes [name], [name], DO  ferrous sulfate 325 (65 FE) MG tablet Take 325 mg by mouth 2 (two) times a week. Mondays  and Thursdays.   Yes [provider]  hydrOXYzine (ATARAX/VISTARIL) 10 MG tablet Take 1 tablet (10 mg total) by mouth at bedtime. 12/06/19  Yes [name], [name], DO  levocetirizine (XYZAL) 5 MG tablet Take 1 tablet (5 mg total) by mouth  every evening. 12/06/19  Yes [name], [name], DO  omeprazole (PRILOSEC) 40 MG capsule Take 1 capsule (40 mg total) by mouth daily. 12/06/19  Yes [name], [name], DO  ondansetron (ZOFRAN ODT) 4 MG disintegrating tablet Take 1 tablet (4 mg total) by mouth every 8 (eight) hours as needed for nausea or vomiting. 12/04/19  Yes Rai, Ripudeep K, MD  pantoprazole (PROTONIX) 40 MG tablet Take 40 mg by mouth daily. 03/03/20  Yes [provider]  pravastatin (PRAVACHOL) 20 MG tablet Take 1 tablet (20 mg total) by mouth every evening. 02/18/20  Yes Strader, [place] M, PA-C  rivaroxaban (XARELTO) 20 MG TABS tablet Take 1 tablet daily with evening meal Patient taking differently: Take 20 mg by mouth daily with supper.  09/25/19  Yes Satira Sark, MD  tamsulosin (FLOMAX) 0.4 MG CAPS capsule Take 1 capsule (0.4 mg total) by mouth daily. 03/03/20  Yes [name], [name], DO  traMADol (ULTRAM) 50 MG tablet TAKE 1 TO 2 TABLETS BY  MOUTH EVERY 8 HOURS AS NEEDED FOR MODERATE PAIN Patient taking differently: Take 50-100 mg by mouth every 8 (eight) hours as needed for moderate pain.  03/09/20  Yes [name], [name], DO  vitamin B-12 (CYANOCOBALAMIN) 100 MCG tablet Take 100 mcg by mouth daily.   Yes [provider]    Current Facility-Administered Medications  Medication Dose Route Frequency Provider Last Rate Last Admin  . bisacodyl (DULCOLAX) EC tablet 5 mg  5 mg Oral Daily PRN Nicole Kindred A, DO      . dextrose 5% in lactated ringers with KCl 20 mEq/L infusion   Intravenous Continuous Nicole Kindred A, DO      . enoxaparin (LOVENOX) injection 40 mg  40 mg Subcutaneous Q24H Nicole Kindred A, DO      . HYDROmorphone (DILAUDID) injection 0.5-1 mg  0.5-1 mg Intravenous Q2H PRN Nicole Kindred A, DO      . morphine 4 MG/ML injection 4 mg  4 mg Intravenous Q2H PRN Wyn Quaker W, PA-C      . senna-docusate (Senokot-S) tablet 1 tablet  1 tablet Oral QHS PRN Ezekiel Slocumb, DO       Current Outpatient Medications  Medication Sig Dispense Refill  . acetaminophen (TYLENOL) 325 MG tablet Take 2 tablets (650 mg total) by mouth every 6 (six) hours as needed for mild pain or moderate pain (or Fever >/= 101).    [name] azelastine (ASTELIN) 0.1 % nasal spray Place 1-2 sprays into both nostrils 2 (two) times daily as needed for rhinitis. Use in each nostril as directed 30 mL 5  . baclofen (LIORESAL) 10 MG tablet Take 10 mg by mouth in the morning and at bedtime.    . calcium carbonate (TUMS - DOSED IN MG ELEMENTAL CALCIUM) 500 MG chewable tablet Chew 1 tablet by mouth 2 (two) times daily.     . carvedilol (COREG) 3.125 MG tablet Take 1 tablet (3.125 mg total) by mouth in the morning and at bedtime. 180 tablet 3  . cholecalciferol (VITAMIN D) 1000 units tablet Take 1,000 Units by mouth daily.    . famotidine (PEPCID) 20 MG tablet Take 1 tablet (20 mg total) by mouth 2 (two) times daily. 180 tablet 1  . ferrous sulfate 325  (65 FE) MG tablet Take 325 mg by mouth 2 (two) times a week. Mondays  and Thursdays.    . hydrOXYzine (ATARAX/VISTARIL) 10 MG tablet Take 1 tablet (10 mg total) by mouth at bedtime. [place]  tablet 1  . levocetirizine (XYZAL) 5 MG tablet Take 1 tablet (5 mg total) by mouth every evening. 180 tablet 1  . omeprazole (PRILOSEC) 40 MG capsule Take 1 capsule (40 mg total) by mouth daily. 30 capsule 3  . ondansetron (ZOFRAN ODT) 4 MG disintegrating tablet Take 1 tablet (4 mg total) by mouth every 8 (eight) hours as needed for nausea or vomiting. 30 tablet 0  . pantoprazole (PROTONIX) 40 MG tablet Take 40 mg by mouth daily.    . pravastatin (PRAVACHOL) 20 MG tablet Take 1 tablet (20 mg total) by mouth every evening. 90 tablet 1  . rivaroxaban (XARELTO) 20 MG TABS tablet Take 1 tablet daily with evening meal (Patient taking differently: Take 20 mg by mouth daily with supper. ) 90 tablet 3  . tamsulosin (FLOMAX) 0.4 MG CAPS capsule Take 1 capsule (0.4 mg total) by mouth daily. 30 capsule 0  . traMADol (ULTRAM) 50 MG tablet TAKE 1 TO 2 TABLETS BY MOUTH EVERY 8 HOURS AS NEEDED FOR MODERATE PAIN (Patient taking differently: Take 50-100 mg by mouth every 8 (eight) hours as needed for moderate pain. ) 180 tablet 3  . vitamin B-12 (CYANOCOBALAMIN) 100 MCG tablet Take 100 mcg by mouth daily.      Allergies as of 03/17/2020 - Review Complete 03/17/2020  Allergen Reaction Noted  . Lopressor [metoprolol tartrate] Swelling 09/05/2017  . Allegra allergy [fexofenadine hcl] Hives 07/16/2019  . Dilaudid [hydromorphone]  09/24/2019  . Nsaids  10/24/2018  . Gabapentin (once-daily) Rash 07/01/2019  . Lipitor [atorvastatin] Diarrhea 10/13/2017    Family History  Problem Relation Age of Onset  . Heart attack Father   . Hypertension Mother   . Hyperlipidemia Mother   . Diabetes type II Mother   . Urticaria Mother   . Hypertension Brother   . Breast cancer Cousin 50  . Immunodeficiency Neg Hx   . Eczema Neg Hx   .  Atopy Neg Hx   . Asthma Neg Hx   . Angioedema Neg Hx   . Allergic rhinitis Neg Hx   . Colon cancer Neg Hx   . Esophageal cancer Neg Hx   . Stomach cancer Neg Hx   . Pancreatic cancer Neg Hx   . Colon polyps Neg Hx   . Esophageal varices Neg Hx   . Inflammatory bowel disease Neg Hx   . Rectal cancer Neg Hx     Social History   Socioeconomic History  . Marital status: Married    Spouse name: Not on file  . Number of children: 2  . Years of education: Not on file  . Highest education level: Not on file  Occupational History  . Not on file  Tobacco Use  . Smoking status: Current Some Day Smoker    Packs/day: 0.10    Years: 37.00    Pack years: 3.70    Types: Cigarettes  . Smokeless tobacco: Never Used  Vaping Use  . Vaping Use: Never used  Substance and Sexual Activity  . Alcohol use: No  . Drug use: No  . Sexual activity: Yes    Partners: Male  Other Topics Concern  . Not on file  Social History Narrative   Married. 2 children.    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    Walks with cane or wheelchair.    Former smoker.    Drinks caffeine.    Smoke alarm in the home.   Firearms in the home.    Wears her seatbelt.  Feels safe In her relationships.    Social Determinants of Health   Financial Resource Strain:   . Difficulty of Paying Living Expenses:   Food Insecurity:   . Worried About Charity fundraiser in the Last Year:   . Arboriculturist in the Last Year:   Transportation Needs:   . Film/video editor (Medical):   [name] Lack of Transportation (Non-Medical):   Physical Activity:   . Days of Exercise per Week:   . Minutes of Exercise per Session:   Stress:   . Feeling of Stress :   Social Connections:   . Frequency of Communication with Friends and Family:   . Frequency of Social Gatherings with Friends and Family:   . Attends Religious Services:   . Active Member of Clubs or Organizations:   . Attends Archivist Meetings:   [name] Marital  Status:   Intimate Partner Violence:   . Fear of Current or Ex-Partner:   . Emotionally Abused:   [name] Physically Abused:   . Sexually Abused:     Review of Systems: All systems reviewed and negative except where noted in HPI.  Physical Exam: Vital signs in last 24 hours: Temp:  [97.4 F (36.3 C)-98 F (36.7 C)] 98 F (36.7 C) (06/22 0835) Pulse Rate:  [91-111] 110 (06/22 1400) Resp:  [13-25] 13 (06/22 1400) BP: (135-160)/(72-98) 148/93 (06/22 1400) SpO2:  [92 %-100 %] 95 % (06/22 1400) Weight:  [52.6 kg] 52.6 kg (06/22 3570)   General:   Alert, well-developed,  female in NAD Psych:  Pleasant, cooperative. Normal mood and affect. Eyes:  Pupils equal, sclera clear, no icterus.   Conjunctiva pink. Ears:  Normal auditory acuity. Nose:  No deformity, discharge,  or lesions. Neck:  Supple; no masses Lungs:  Clear throughout to auscultation.   No wheezes, crackles, or rhonchi.  Heart:  Regular rate and rhythm; no murmurs, no lower extremity edema Abdomen:  Soft, non-distended, mild RLQ tenderness, BS active, no palp mass   Rectal:  Deferred  Msk:  Symmetrical without gross deformities. . Neurologic:  Alert and  oriented x4;  grossly normal neurologically. Skin:  Intact without significant lesions or rashes.   Intake/Output from previous day: No intake/output data recorded. Intake/Output this shift: Total I/O In: 1000 [IV Piggyback:1000] Out: -   Lab Results: Recent Labs    03/17/20 0231  WBC 16.8*  HGB 15.4*  HCT 47.5*  PLT 377   BMET Recent Labs    03/17/20 0231  NA 138  K 3.0*  CL 103  CO2 27  GLUCOSE 138*  BUN 11  CREATININE 0.58  CALCIUM 8.8*   LFT Recent Labs    03/17/20 0231  PROT 7.2  ALBUMIN 4.2  AST 19  ALT 15  ALKPHOS 70  BILITOT 0.4   PT/INR Recent Labs    03/17/20 0817  LABPROT 15.0  INR 1.2   Hepatitis Panel No results for input(s): HEPBSAG, HCVAB, HEPAIGM, HEPBIGM in the last 72 hours.   . CBC Latest Ref Rng & Units  03/17/2020 02/18/2020 12/04/2019  WBC 4.0 - 10.5 K/uL 16.8(H) 8.6 5.0  Hemoglobin 12.0 - 15.0 g/dL 15.4(H) 14.5 13.6  Hematocrit 36 - 46 % 47.5(H) 43.5 41.9  Platelets 150 - 400 K/uL 377 361.0 309    . CMP Latest Ref Rng & Units 03/17/2020 02/18/2020 12/04/2019  Glucose 70 - 99 mg/dL 138(H) 85 149(H)  BUN 6 - 20 mg/dL 11 14 9   Creatinine 0.44 -  1.00 mg/dL 0.58 0.59 0.52  Sodium 135 - 145 mmol/L 138 140 138  Potassium 3.5 - 5.1 mmol/L 3.0(L) 4.8 3.4(L)  Chloride 98 - 111 mmol/L 103 103 106  CO2 22 - 32 mmol/L 27 32 24  Calcium 8.9 - 10.3 mg/dL 8.8(L) 9.8 8.7(L)  Total Protein 6.5 - 8.1 g/dL 7.2 - -  Total Bilirubin 0.3 - 1.2 mg/dL 0.4 - -  Alkaline Phos 38 - 126 U/L 70 - -  AST 15 - 41 U/L 19 - -  ALT 0 - 44 U/L 15 - -   Studies/Results: CT ABDOMEN PELVIS W CONTRAST  Result Date: 03/17/2020 CLINICAL DATA:  Acute generalized abdominal pain. EXAM: CT ABDOMEN AND PELVIS WITH CONTRAST TECHNIQUE: Multidetector CT imaging of the abdomen and pelvis was performed using the standard protocol following bolus administration of intravenous contrast. CONTRAST:  126mL OMNIPAQUE IOHEXOL 300 MG/ML  SOLN COMPARISON:  March 02, 2020.  Jan 31, 2020.  March 5th 2021. FINDINGS: Lower chest: No acute abnormality. Hepatobiliary: No focal liver abnormality is seen. No gallstones, gallbladder wall thickening, or biliary dilatation. Pancreas: No ductal dilatation is noted. However, rounded low density is noted in the pancreatic head which appears to be enlarged compared to prior exam, currently measuring 31 x 26 mm. 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 Clinical and laboratory correlation is recommended. Spleen: Normal in size without focal abnormality. Adrenals/Urinary Tract: Adrenal glands appear normal. Small left renal cysts are noted. No hydronephrosis or renal obstruction is noted. No renal or ureteral calculi are noted. Urinary bladder is  unremarkable. Stomach/Bowel: Stomach is within normal limits. Appendix appears normal. No evidence of bowel wall thickening, distention, or inflammatory changes. Vascular/Lymphatic: Grossly stable 3.3 cm infrarenal abdominal aortic aneurysm is noted. No adenopathy is noted. Reproductive: Uterus and bilateral adnexa are unremarkable. Other: No abdominal wall hernia or abnormality. No abdominopelvic ascites. Musculoskeletal: There appears to be lytic destruction involving the greater trochanter of the proximal right femur with associated pathologic fracture. This is concerning for possible malignancy or metastatic disease. IMPRESSION: 1. There appears to be lytic destruction involving the greater trochanter of the proximal right femur with associated pathologic fracture. This is concerning for possible malignancy or metastatic disease. MRI or bone scan is recommended for further evaluation. 2. Rounded low density is noted in the pancreatic head which appears to be enlarged compared to prior exam, currently measuring 31 x 26 mm. 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 Clinical and laboratory correlation is recommended, and MRI also should be considered to rule out potential neoplasm or malignancy. 3. Grossly stable 3.3 cm infrarenal abdominal aortic aneurysm. Recommend followup by ultrasound in 3 years. This recommendation follows ACR consensus guidelines: White Paper of the ACR Incidental Findings Committee II on Vascular Findings. J Am Coll Radiol 2013; 10:789-794. Aortic Atherosclerosis (ICD10-I70.0). Electronically Signed   By: Marijo Conception M.D.   On: 03/17/2020 11:33   DG Chest Port 1 View  Result Date: 03/17/2020 CLINICAL DATA:  Sepsis. EXAM: PORTABLE CHEST 1 VIEW COMPARISON:  None. FINDINGS: The heart size and mediastinal contours are within normal limits. Sternotomy wires are noted. No pneumothorax or pleural effusion is noted. Both  lungs are clear. The visualized skeletal structures are unremarkable. IMPRESSION: No active disease. Electronically Signed   By: Marijo Conception M.D.   On: 03/17/2020 08:28    Active Problems:  Acute on chronic pancreatitis ([place])    Tye Savoy, NP-C @  03/17/2020, 2:23 PM

## 2020-03-17 NOTE — ED Triage Notes (Signed)
Patient arrived stating she has been vomiting and had generalized abdominal pain that started about two hours ago. History of pancreatitis. Patient states she was at [facility] earlier tonight but they did not give her anything for pain.

## 2020-03-17 NOTE — ED Notes (Signed)
Transport called.

## 2020-03-18 ENCOUNTER — Encounter (HOSPITAL_COMMUNITY): Payer: Self-pay | Admitting: Internal Medicine

## 2020-03-18 LAB — COMPREHENSIVE METABOLIC PANEL
ALT: 13 U/L (ref 0–44)
AST: 20 U/L (ref 15–41)
Albumin: 3.2 g/dL — ABNORMAL LOW (ref 3.5–5.0)
Alkaline Phosphatase: 62 U/L (ref 38–126)
Anion gap: 7 (ref 5–15)
BUN: 10 mg/dL (ref 6–20)
CO2: 21 mmol/L — ABNORMAL LOW (ref 22–32)
Calcium: 8.5 mg/dL — ABNORMAL LOW (ref 8.9–10.3)
Chloride: 104 mmol/L (ref 98–111)
Creatinine, Ser: 0.47 mg/dL (ref 0.44–1.00)
GFR calc Af Amer: >60 mL/min (ref >60)
GFR calc non Af Amer: >60 mL/min (ref >60)
Glucose, Bld: 83 mg/dL (ref 70–99)
Potassium: 3.7 mmol/L (ref 3.5–5.1)
Sodium: 132 mmol/L — ABNORMAL LOW (ref 135–145)
Total Bilirubin: 0.5 mg/dL (ref 0.3–1.2)
Total Protein: 6.2 g/dL — ABNORMAL LOW (ref 6.5–8.1)

## 2020-03-18 LAB — CBC
HCT: 48.1 % — ABNORMAL HIGH (ref 36.0–46.0)
Hemoglobin: 15.9 g/dL — ABNORMAL HIGH (ref 12.0–15.0)
MCH: 31.5 pg (ref 26.0–34.0)
MCHC: 33.1 g/dL (ref 30.0–36.0)
MCV: 95.4 fL (ref 80.0–100.0)
Platelets: 362 10*3/uL (ref 150–400)
RBC: 5.04 MIL/uL (ref 3.87–5.11)
RDW: 13 % (ref 11.5–15.5)
WBC: 15.7 10*3/uL — ABNORMAL HIGH (ref 4.0–10.5)
nRBC: 0 % (ref 0.0–0.2)

## 2020-03-18 LAB — MAGNESIUM: Magnesium: 2 mg/dL (ref 1.7–2.4)

## 2020-03-18 MED ORDER — LACTATED RINGERS IV BOLUS
250.0000 mL | Freq: Once | INTRAVENOUS | Status: AC
  Administered 2020-03-18: 250 mL via INTRAVENOUS

## 2020-03-18 MED ORDER — LACTATED RINGERS IV SOLN: INTRAVENOUS | Status: AC

## 2020-03-18 NOTE — Progress Notes (Addendum)
Progress Note  CC:   pancreatitis       ASSESSMENT AND PLAN:   [name] is a 59 y.o. female PMH significant for, but not necessarily limited to, CVA March 2021, CAD/CABG, chronic diastolic CHF, atrial fibrillation, pancreatitis, infrarenal aortic aneurysm, 3.4 cm with irregular mural thrombus   #  Recurrent pancreatitis of unclear etiology --Hypoechoic lesion in [place].  Status post EUS with FNA in March 2021.  Path showed atypical cells thought to be inflammatory at the time.  Sludge noted in gallbladder.  CT scan in May showed improved inflammatory changes and decreased size of suspected pseudocyst. --Lesion now enlarged compared to previous imaging, still felt to represent pseudocyst given rapid size increase.. May need repeat MRI this admission. --Hemoconcentration.  Despite aggressive IV fluid resuscitation her hemoglobin/hematocrit are higher today at 15.9 and 48.1.  She is still n.p.o. for now.  I am going to increase fluid rate back to 200 mL an hour x 1 liter.  --WBC remains elevated ~ 15K. Probably inflammatory related to pancreatitis.  --am labs --She is feeling better, hopefully trial of clear liquids in the morning --Still considering empiric cholecystectomy given sludge in gallbladder and no other etiologies for recurrent pancreatitis  # Infrarenal aortic aneurysm --grossly stable at 3.3 cm.   # Lytic lesion involving the greater trochanter of the proximal right femur with associated pathologic fracture on CT scan --[place] in March in setting of CVA but imaging negative for fracture at that time.  --This is concerning for possible malignancy or metastatic disease. MRI or bone scan is recommended for further evaluation  # Afib, on Xarelto     SUBJECTIVE   Abdomen is "sore" but not having pain like she was yesterday. No nausea. Patient and husband frustrated at lack of answers about cause of recurrent pancreatitis   OBJECTIVE:     Vital signs in last 24  hours: Temp:  [97.9 F (36.6 C)-98.2 F (36.8 C)] 97.9 F (36.6 C) (06/23 1306) Pulse Rate:  [101-120] 112 (06/23 1306) Resp:  [13-28] 18 (06/23 1306) BP: (89-151)/(58-93) 100/72 (06/23 1306) SpO2:  [93 %-100 %] 100 % (06/23 1306) Last BM Date: 03/16/20 General:   Alert, in NAD Heart:  Regular rate and rhythm.  No lower extremity edema   Pulm: Normal respiratory effort   Abdomen:  Soft, mild-mod diffuse upper abdominal tenderness. Nondistended.  Normal bowel sounds.          Neurologic:  Alert and  oriented,  grossly normal neurologically. Psych:  Pleasant, cooperative.  Normal mood and affect.   Intake/Output from previous day: 06/22 0701 - 06/23 0700 In: 1702.4 [P.O.:60; I.V.:642.4; IV Piggyback:1000] Out: -  Intake/Output this shift: No intake/output data recorded.  Lab Results: Recent Labs    03/17/20 0231 03/18/20 0548  WBC 16.8* 15.7*  HGB 15.4* 15.9*  HCT 47.5* 48.1*  PLT 377 362   BMET Recent Labs    03/17/20 0231 03/18/20 0548  NA 138 132*  K 3.0* 3.7  CL 103 104  CO2 27 21*  GLUCOSE 138* 83  BUN 11 10  CREATININE 0.58 0.47  CALCIUM 8.8* 8.5*   LFT Recent Labs    03/18/20 0548  PROT 6.2*  ALBUMIN 3.2*  AST 20  ALT 13  ALKPHOS 62  BILITOT 0.5   PT/INR Recent Labs    03/17/20 0817  LABPROT 15.0  INR 1.2   Hepatitis Panel No results for input(s): HEPBSAG, HCVAB, HEPAIGM, HEPBIGM in the last  72 hours.  CT ABDOMEN PELVIS W CONTRAST  Result Date: 03/17/2020 CLINICAL DATA:  Acute generalized abdominal pain. EXAM: CT ABDOMEN AND PELVIS WITH CONTRAST TECHNIQUE: Multidetector CT imaging of the abdomen and pelvis was performed using the standard protocol following bolus administration of intravenous contrast. CONTRAST:  179mL OMNIPAQUE IOHEXOL 300 MG/ML  SOLN COMPARISON:  March 02, 2020.  Jan 31, 2020.  March 5th 2021. FINDINGS: Lower chest: No acute abnormality. Hepatobiliary: No focal liver abnormality is seen. No gallstones, gallbladder wall  thickening, or biliary dilatation. Pancreas: No ductal dilatation is noted. However, rounded low density is noted in the pancreatic head which appears to be enlarged compared to prior exam, currently measuring 31 x 26 mm. 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 Clinical and laboratory correlation is recommended. Spleen: Normal in size without focal abnormality. Adrenals/Urinary Tract: Adrenal glands appear normal. Small left renal cysts are noted. No hydronephrosis or renal obstruction is noted. No renal or ureteral calculi are noted. Urinary bladder is unremarkable. Stomach/Bowel: Stomach is within normal limits. Appendix appears normal. No evidence of bowel wall thickening, distention, or inflammatory changes. Vascular/Lymphatic: Grossly stable 3.3 cm infrarenal abdominal aortic aneurysm is noted. No adenopathy is noted. Reproductive: Uterus and bilateral adnexa are unremarkable. Other: No abdominal wall hernia or abnormality. No abdominopelvic ascites. Musculoskeletal: There appears to be lytic destruction involving the greater trochanter of the proximal right femur with associated pathologic fracture. This is concerning for possible malignancy or metastatic disease. IMPRESSION: 1. There appears to be lytic destruction involving the greater trochanter of the proximal right femur with associated pathologic fracture. This is concerning for possible malignancy or metastatic disease. MRI or bone scan is recommended for further evaluation. 2. Rounded low density is noted in the pancreatic head which appears to be enlarged compared to prior exam, currently measuring 31 x 26 mm. 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 Clinical and laboratory correlation is recommended, and MRI also should be considered to rule out potential neoplasm or malignancy. 3. Grossly stable 3.3 cm  infrarenal abdominal aortic aneurysm. Recommend followup by ultrasound in 3 years. This recommendation follows ACR consensus guidelines: White Paper of the ACR Incidental Findings Committee II on Vascular Findings. J Am Coll Radiol 2013; 10:789-794. Aortic Atherosclerosis (ICD10-I70.0). Electronically Signed   By: Marijo Conception M.D.   On: 03/17/2020 11:33   DG Chest Port 1 View  Result Date: 03/17/2020 CLINICAL DATA:  Sepsis. EXAM: PORTABLE CHEST 1 VIEW COMPARISON:  None. FINDINGS: The heart size and mediastinal contours are within normal limits. Sternotomy wires are noted. No pneumothorax or pleural effusion is noted. Both lungs are clear. The visualized skeletal structures are unremarkable. IMPRESSION: No active disease. Electronically Signed   By: Marijo Conception M.D.   On: 03/17/2020 08:28      Principal Problem:   Acute on chronic pancreatitis [facility]) Active Problems:   Cerebrovascular accident (CVA) due to occlusion of left middle cerebral artery (HCC)   Hyperlipidemia LDL goal <70   Hypokalemia   Benign essential HTN   Chronic diastolic CHF (congestive heart failure) (HCC)   Paroxysmal atrial fibrillation ([place])   Anxiety     LOS: 1 day   Tye Savoy ,NP 03/18/2020, 1:11 PM

## 2020-03-18 NOTE — Significant Event (Signed)
Rapid Response Event Note  Overview: Time Called: 2011 Arrival Time: 2015 Event Type: Hypotension, MEWS  Initial Focused Assessment: Rapid response notified by patient's primary nurse that patient's blood pressure dropped into the 71'T systolic. Instructed nursing staff to start an IV on the patient and that I would come up and assess. Entered the room and found patient completely alert and oriented in bed. Patient denies any headaches, lightheadedness, or dizziness. Blood pressure cuff adjusted and blood pressure recycled at 109/74 (86). Patient's HR 100 in ST on the monitor.   Interventions: instructed nursing staff to restart IV and run IV fluids  Plan of Care (if not transferred): No bolus necessary at this time as patient appears to be completely hemodynamically stable. Instructed nursing staff to cycle blood pressure every hour for the next four hours and encouraged them to call Rapid Response back if there are any changes.  Event Summary: No physician was paged by rapid response. This does not appear to be an acute change and patient appears stable.     Outcome: Stayed in room and stabalized  Event End Time: 2025  Clarene Critchley

## 2020-03-18 NOTE — Progress Notes (Signed)
PROGRESS NOTE    [name]    Code Status: Full Code  NWG:[id number] DOB: [birth year] DOA: 03/17/2020 LOS: 1 days  PCP: [name], DO CC:  Chief Complaint  Patient presents with  . Abdominal Pain       Hospital Summary   This is a 59 year old female with history of recurrent idiopathic pancreatitis, paroxysmal A. fib, CAD s/p CABG, hypertension, anxiety, recent CVA, recent fall in March, AAA who presented to the ED on 6/22 with persistent epigastric abdominal pain with associated nausea and vomiting.  Had been taking Flomax in order to pass a kidney stone recently and was having some hematuria but this had resolved.  ED Course: Afebrile, mildly tachycardic, BP 136/96.  CBC notable for leukocytosis 16.8, hemoconcentration with hemoglobin 15.4.  BMP was notable for potassium of 3.0 and glucose 138.  Lipase elevated 6271.  Urinalysis was consistent with infection and reflex to culture.  Chest x-ray negative.  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  GI was consulted for recurrent pancreatitis.    A & P   Principal Problem:   Acute on chronic pancreatitis (HCC) Active Problems:   Cerebrovascular accident (CVA) due to occlusion of left middle cerebral artery (HCC)   Hyperlipidemia LDL goal <70   Hypokalemia   Benign essential HTN   Chronic diastolic CHF (congestive heart failure) (HCC)   Paroxysmal atrial fibrillation (HCC)   Anxiety   Recurrent pancreatitis   1. Recurrent acute on chronic idiopathic pancreatitis with suspected pseudocyst a. S/p EUS with FNA in March 2021 with atypical cells thought to be inflammatory at the time.  Sludge  noted in gallbladder.  CT scan in May showed improved inflammatory changes and decreased size of suspected pseudocyst b. Reactive leukocytosis c. Per GI: Hold Xarelto today, keep n.p.o. after midnight for EUS tomorrow and could consider empiric cholecystectomy pending findings.  Increase IV fluids  2. Concern for lytic lesion of right greater trochanter with associated pathologic fracture a. Recent fall in March in setting of her CVA at the time with negative imaging for fracture at the time b. Now with new, incidental finding on CT pelvis c. MRI for further evaluation  3. Reactive leukocytosis  4. Asymptomatic E. coli UTI a. Urine cultures pending b. Hold antibiotics for now.  Will start if she becomes febrile or has symptoms  5. History of present nephrolithiasis, resolved a. On Flomax  6. History of gastritis/duodenitis a. IV PPI twice daily  7. Hyperlipidemia   8. Hypertension a. Continue low-dose Coreg for now  9. Chronic diastolic heart failure, not in acute exacerbation a. Will monitor for volume overload as she is getting high volumes of fluids pancreatitis b. Daily weights and I/O  10. Paroxysmal A. fib on Xarelto and Coreg a. Hold Xarelto as above  11. Anxiety stable on home Vistaril  12. Stable infrarenal AAA a. Repeat ultrasound in 3 years for surveillance  DVT prophylaxis:  SCDs     Family Communication: No family at bedside  Disposition Plan:  Status is: Inpatient  Remains inpatient appropriate because:IV treatments appropriate due to intensity of illness or inability to take PO and Inpatient level of care appropriate due to  severity of illness   Dispo: The patient is from: Home              Anticipated d/c is to: Home              Anticipated d/c date is: 3 days              Patient currently is not medically stable to d/c.          Pressure injury documentation    None  Consultants  GI   Procedures  None  Antibiotics    Anti-infectives (From admission, onward)   Start     Dose/Rate Route Frequency Ordered Stop   03/17/20 1100  cefTRIAXone (ROCEPHIN) 1 g in sodium chloride 0.9 % 100 mL IVPB        1 g 200 mL/hr over 30 Minutes Intravenous  Once 03/17/20 1046 03/17/20 1551        Subjective   Feeling a little better today but still with some abdominal discomfort.  No nausea or vomiting.  No overnight events.  Denies any hematuria or dysuria or fevers.  No other issues  Objective   Vitals:   03/18/20 0237 03/18/20 0632 03/18/20 0642 03/18/20 1306  BP: 116/66 (!) 89/58 104/76 100/72  Pulse: (!) 101 (!) 113 (!) 120 (!) 112  Resp: 18 18 18 18   Temp: 98.1 F (36.7 C) 98 F (36.7 C) 98.1 F (36.7 C) 97.9 F (36.6 C)  TempSrc: Oral Oral Oral Oral  SpO2: 99% 97% 96% 100%  Weight:      Height:        Intake/Output Summary (Last 24 hours) at 03/18/2020 1503 Last data filed at 03/18/2020 0600 Gross per 24 hour  Intake 702.43 ml  Output --  Net 702.43 ml   Filed Weights   03/17/20 0658  Weight: 52.6 kg    Examination:  Physical Exam Vitals and nursing note reviewed.  Constitutional:      Appearance: Normal appearance.  HENT:     Head: Normocephalic and atraumatic.  Eyes:     Conjunctiva/sclera: Conjunctivae normal.  Cardiovascular:     Rate and Rhythm: Normal rate and regular rhythm.  Pulmonary:     Effort: Pulmonary effort is normal.     Breath sounds: Normal breath sounds.  Abdominal:     General: Abdomen is flat.     Palpations: Abdomen is soft.  Musculoskeletal:        General: No swelling or tenderness.  Skin:    Coloration: Skin is not jaundiced or pale.  Neurological:     Mental Status: She is alert. Mental status is at baseline.  Psychiatric:        Mood and Affect: Mood normal.        Behavior: Behavior normal.     Data Reviewed: I have personally reviewed following labs and imaging studies  CBC: Recent Labs  Lab 03/17/20 0231 03/18/20 0548  WBC 16.8*  15.7*  HGB 15.4* 15.9*  HCT 47.5* 48.1*  MCV 98.8 95.4  PLT 377 470   Basic Metabolic Panel: Recent Labs  Lab 03/17/20 0231 03/17/20 0817 03/18/20 0548  NA 138  --  132*  K 3.0*  --  3.7  CL 103  --  104  CO2 27  --  21*  GLUCOSE 138*  --  83  BUN 11  --  10  CREATININE 0.58  --  0.47  CALCIUM 8.8*  --  8.5*  MG  --  2.2 2.0   GFR: Estimated Creatinine Clearance: 54.4 mL/min (by C-G formula based on SCr of 0.47 mg/dL). Liver Function Tests: Recent Labs  Lab 03/17/20 0231 03/18/20 0548  AST 19 20  ALT 15 13  ALKPHOS 70 62  BILITOT 0.4 0.5  PROT 7.2 6.2*  ALBUMIN 4.2 3.2*   Recent Labs  Lab 03/17/20 0231  LIPASE 6,271*   No results for input(s): AMMONIA in the last 168 hours. Coagulation Profile: Recent Labs  Lab 03/17/20 0817  INR 1.2   Cardiac Enzymes: No results for input(s): CKTOTAL, CKMB, CKMBINDEX, TROPONINI in the last 168 hours. BNP (last 3 results) No results for input(s): PROBNP in the last 8760 hours. HbA1C: No results for input(s): HGBA1C in the last 72 hours. CBG: No results for input(s): GLUCAP in the last 168 hours. Lipid Profile: No results for input(s): CHOL, HDL, LDLCALC, TRIG, CHOLHDL, LDLDIRECT in the last 72 hours. Thyroid Function Tests: No results for input(s): TSH, T4TOTAL, FREET4, T3FREE, THYROIDAB in the last 72 hours. Anemia Panel: No results for input(s): VITAMINB12, FOLATE, FERRITIN, TIBC, IRON, RETICCTPCT in the last 72 hours. Sepsis Labs: Recent Labs  Lab 03/17/20 0817  LATICACIDVEN 1.1    Recent Results (from the past 240 hour(s))  Blood Culture (routine x 2)     Status: None (Preliminary result)   Collection Time: 03/17/20  8:17 AM   Specimen: BLOOD LEFT HAND  Result Value Ref Range Status   Specimen Description   Final    BLOOD LEFT HAND Performed at [facility], [place] [address] [place], [place] [address]    Special Requests   Final    BOTTLES DRAWN AEROBIC AND ANAEROBIC Blood Culture  results may not be optimal due to an inadequate volume of blood received in culture bottles Performed at [place] [address], [place], [place] [address]    Culture   Final    NO GROWTH 1 DAY Performed at [facility], [place] [address] [place], [place] [address]    Report Status PENDING  Incomplete  Blood Culture (routine x 2)     Status: None (Preliminary result)   Collection Time: 03/17/20  9:14 AM   Specimen: BLOOD  Result Value Ref Range Status   Specimen Description   Final    BLOOD RIGHT ANTECUBITAL Performed at [place] [address], [place], [place] [address]    Special Requests   Final    BOTTLES DRAWN AEROBIC AND ANAEROBIC Blood Culture results may not be optimal due to an excessive volume of blood received in culture bottles Performed at [place] [address], [place], [place] [address]    Culture   Final    NO GROWTH 1 DAY Performed at [facility], [place] [address], [place], [place] [address]    Report Status PENDING  Incomplete  Urine culture     Status: Abnormal (Preliminary result)   Collection Time: 03/17/20  9:48 AM   Specimen: Urine, Clean Catch  Result Value Ref Range Status   Specimen Description   Final    URINE, CLEAN CATCH Performed at [facility], [place] [address], [place], [place] [address]    Special Requests   Final    NONE Performed at [facility], [place] [address], [place], [place] [address]    Culture (A)  Final    >=100,000 COLONIES/mL ESCHERICHIA COLI SUSCEPTIBILITIES TO FOLLOW Performed at [facility], [address] [place], [place] [address]    Report Status PENDING  Incomplete  SARS Coronavirus 2 by RT PCR (hospital order, performed in [facility] hospital lab) Nasopharyngeal Nasopharyngeal Swab     Status: None   Collection Time: 03/17/20  9:58 PM   Specimen: Nasopharyngeal Swab  Result Value Ref Range  Status   SARS Coronavirus 2 NEGATIVE NEGATIVE Final    Comment: (NOTE) SARS-CoV-2 target nucleic acids are NOT DETECTED.  The SARS-CoV-2 RNA is generally detectable in upper and lower respiratory specimens during the acute phase of infection. The lowest concentration of SARS-CoV-2 viral copies this assay can detect is 250 copies / mL. A negative result does not preclude SARS-CoV-2 infection and should not be used as the sole basis for treatment or other patient management decisions.  A negative result may occur with improper specimen collection / handling, submission of specimen other than nasopharyngeal swab, presence of viral mutation(s) within the areas targeted by this assay, and inadequate number of viral copies (<250 copies / mL). A negative result must be combined with clinical observations, patient history, and epidemiological information.  Fact Sheet for Patients:   StrictlyIdeas.no  Fact Sheet for Healthcare Providers: BankingDealers.co.za  This test is not yet approved or  cleared by the [place] FDA and has been authorized for detection and/or diagnosis of SARS-CoV-2 by FDA under an Emergency Use Authorization (EUA).  This EUA will remain in effect (meaning this test can be used) for the duration of the COVID-19 declaration under Section 564(b)(1) of the Act, 21 U.S.C. section 360bbb-3(b)(1), unless the authorization is terminated or revoked sooner.  Performed at [facility], [place] [address] [place], [place] [address]          Radiology Studies: CT ABDOMEN PELVIS W CONTRAST  Result Date: 03/17/2020 CLINICAL DATA:  Acute generalized abdominal pain. EXAM: CT ABDOMEN AND PELVIS WITH CONTRAST TECHNIQUE: Multidetector CT imaging of the abdomen and pelvis was performed using the standard protocol following bolus administration of intravenous contrast. CONTRAST:  147mL OMNIPAQUE IOHEXOL 300 MG/ML   SOLN COMPARISON:  March 02, 2020.  Jan 31, 2020.  March 5th 2021. FINDINGS: Lower chest: No acute abnormality. Hepatobiliary: No focal liver abnormality is seen. No gallstones, gallbladder wall thickening, or biliary dilatation. Pancreas: No ductal dilatation is noted. However, rounded low density is noted in the pancreatic head which appears to be enlarged compared to prior exam, currently measuring 31 x 26 mm. 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 Clinical and laboratory correlation is recommended. Spleen: Normal in size without focal abnormality. Adrenals/Urinary Tract: Adrenal glands appear normal. Small left renal cysts are noted. No hydronephrosis or renal obstruction is noted. No renal or ureteral calculi are noted. Urinary bladder is unremarkable. Stomach/Bowel: Stomach is within normal limits. Appendix appears normal. No evidence of bowel wall thickening, distention, or inflammatory changes. Vascular/Lymphatic: Grossly stable 3.3 cm infrarenal abdominal aortic aneurysm is noted. No adenopathy is noted. Reproductive: Uterus and bilateral adnexa are unremarkable. Other: No abdominal wall hernia or abnormality. No abdominopelvic ascites. Musculoskeletal: There appears to be lytic destruction involving the greater trochanter of the proximal right femur with associated pathologic fracture. This is concerning for possible malignancy or metastatic disease. IMPRESSION: 1. There appears to be lytic destruction involving the greater trochanter of the proximal right femur with associated pathologic fracture. This is concerning for possible malignancy or metastatic disease. MRI or bone scan is recommended  for further evaluation. 2. Rounded low density is noted in the pancreatic head which appears to be enlarged compared to prior exam, currently measuring 31 x 26 mm. This most likely represents pancreatic pseudocyst given the rapid increase in size  since prior exam of May 7th. Clinical and laboratory correlation is recommended, and MRI also should be considered to rule out potential neoplasm or malignancy. 3. Grossly stable 3.3 cm infrarenal abdominal aortic aneurysm. Recommend followup by ultrasound in 3 years. This recommendation follows ACR consensus guidelines: White Paper of the ACR Incidental Findings Committee II on Vascular Findings. J Am Coll Radiol 2013; 10:789-794. Aortic Atherosclerosis (ICD10-I70.0). Electronically Signed   By: Marijo Conception M.D.   On: 03/17/2020 11:33   DG Chest Port 1 View  Result Date: 03/17/2020 CLINICAL DATA:  Sepsis. EXAM: PORTABLE CHEST 1 VIEW COMPARISON:  None. FINDINGS: The heart size and mediastinal contours are within normal limits. Sternotomy wires are noted. No pneumothorax or pleural effusion is noted. Both lungs are clear. The visualized skeletal structures are unremarkable. IMPRESSION: No active disease. Electronically Signed   By: Marijo Conception M.D.   On: 03/17/2020 08:28        Scheduled Meds: . baclofen  10 mg Oral BID  . calcium carbonate  1 tablet Oral BID  . carvedilol  3.125 mg Oral BID WC  . cetirizine  10 mg Oral Daily  . hydrOXYzine  10 mg Oral QHS  . pantoprazole (PROTONIX) IV  40 mg Intravenous Q12H  . tamsulosin  0.4 mg Oral Daily   Continuous Infusions: . lactated ringers 100 mL/hr at 03/18/20 1434  . lactated ringers       Time spent: 29 minutes with over 50% of the time coordinating the patient's care    Harold Hedge, DO [facility] Pager [phone]  Call night coverage person covering after 7pm

## 2020-03-19 ENCOUNTER — Inpatient Hospital Stay (HOSPITAL_COMMUNITY): Payer: 59 | Admitting: Anesthesiology

## 2020-03-19 ENCOUNTER — Inpatient Hospital Stay (HOSPITAL_COMMUNITY): Payer: 59

## 2020-03-19 ENCOUNTER — Encounter (HOSPITAL_COMMUNITY): Payer: Self-pay | Admitting: Internal Medicine

## 2020-03-19 ENCOUNTER — Encounter (HOSPITAL_COMMUNITY)
Admission: EM | Disposition: A | Discharge status: Home / Self Care | Payer: Self-pay | Source: Home / Self Care | Attending: Internal Medicine

## 2020-03-19 DIAGNOSIS — K8689 Other specified diseases of pancreas: Secondary | ICD-10-CM

## 2020-03-19 HISTORY — PX: ESOPHAGOGASTRODUODENOSCOPY (EGD) WITH PROPOFOL: SHX5813

## 2020-03-19 HISTORY — PX: EUS: SHX5427

## 2020-03-19 HISTORY — PX: FINE NEEDLE ASPIRATION: SHX5430

## 2020-03-19 LAB — BASIC METABOLIC PANEL
Anion gap: 7 (ref 5–15)
BUN: 10 mg/dL (ref 6–20)
CO2: 25 mmol/L (ref 22–32)
Calcium: 8.3 mg/dL — ABNORMAL LOW (ref 8.9–10.3)
Chloride: 107 mmol/L (ref 98–111)
Creatinine, Ser: 0.46 mg/dL (ref 0.44–1.00)
GFR calc Af Amer: >60 mL/min (ref >60)
GFR calc non Af Amer: >60 mL/min (ref >60)
Glucose, Bld: 76 mg/dL (ref 70–99)
Potassium: 3.4 mmol/L — ABNORMAL LOW (ref 3.5–5.1)
Sodium: 139 mmol/L (ref 135–145)

## 2020-03-19 LAB — SURGICAL PCR SCREEN
MRSA, PCR: NEGATIVE
Staphylococcus aureus: NEGATIVE

## 2020-03-19 LAB — CBC
HCT: 38.5 % (ref 36.0–46.0)
Hemoglobin: 12.5 g/dL (ref 12.0–15.0)
MCH: 31.1 pg (ref 26.0–34.0)
MCHC: 32.5 g/dL (ref 30.0–36.0)
MCV: 95.8 fL (ref 80.0–100.0)
Platelets: 300 10*3/uL (ref 150–400)
RBC: 4.02 MIL/uL (ref 3.87–5.11)
RDW: 13 % (ref 11.5–15.5)
WBC: 8.1 10*3/uL (ref 4.0–10.5)
nRBC: 0 % (ref 0.0–0.2)

## 2020-03-19 LAB — URINE CULTURE: Culture: >=100000 — AB

## 2020-03-19 SURGERY — UPPER ENDOSCOPIC ULTRASOUND (EUS) RADIAL
Anesthesia: Monitor Anesthesia Care

## 2020-03-19 MED ORDER — PROPOFOL 10 MG/ML IV BOLUS
INTRAVENOUS | Status: DC | PRN
  Administered 2020-03-19: 20 mg via INTRAVENOUS

## 2020-03-19 MED ORDER — URSODIOL 300 MG PO CAPS
300.0000 mg | ORAL_CAPSULE | Freq: Two times a day (BID) | ORAL | Status: DC
  Administered 2020-03-19 – 2020-03-24 (×11): 300 mg via ORAL
  Filled 2020-03-19 (×11): qty 1

## 2020-03-19 MED ORDER — PANTOPRAZOLE SODIUM 40 MG PO TBEC
40.0000 mg | DELAYED_RELEASE_TABLET | Freq: Every day | ORAL | Status: DC
  Administered 2020-03-19 – 2020-03-24 (×6): 40 mg via ORAL
  Filled 2020-03-19 (×6): qty 1

## 2020-03-19 MED ORDER — ONDANSETRON HCL 4 MG/2ML IJ SOLN
INTRAMUSCULAR | Status: DC | PRN
  Administered 2020-03-19: 4 mg via INTRAVENOUS

## 2020-03-19 MED ORDER — ONDANSETRON HCL 4 MG/2ML IJ SOLN
4.0000 mg | Freq: Four times a day (QID) | INTRAMUSCULAR | Status: DC | PRN
  Administered 2020-03-19: 4 mg via INTRAVENOUS
  Filled 2020-03-19: qty 2

## 2020-03-19 MED ORDER — GADOBUTROL 1 MMOL/ML IV SOLN
5.0000 mL | Freq: Once | INTRAVENOUS | Status: AC | PRN
  Administered 2020-03-19: 5 mL via INTRAVENOUS

## 2020-03-19 MED ORDER — GLYCOPYRROLATE 0.2 MG/ML IJ SOLN
INTRAMUSCULAR | Status: DC | PRN
Start: 2020-03-19 — End: 2020-03-19
  Administered 2020-03-19: .1 mg via INTRAVENOUS

## 2020-03-19 MED ORDER — LIDOCAINE HCL (CARDIAC) PF 100 MG/5ML IV SOSY
PREFILLED_SYRINGE | INTRAVENOUS | Status: DC | PRN
  Administered 2020-03-19: 75 mg via INTRAVENOUS

## 2020-03-19 MED ORDER — PROPOFOL 500 MG/50ML IV EMUL
INTRAVENOUS | Status: DC | PRN
  Administered 2020-03-19: 200 ug/kg/min via INTRAVENOUS

## 2020-03-19 NOTE — Op Note (Signed)
[facility] Patient Name: [name] Procedure Date: 03/19/2020 MRN: [id number] Attending MD: Milus Banister , MD Date of Birth: [birth year] CSN: [id number] Age: 59 Admit Type: Outpatient Procedure:                Upper EUS Indications:              abnormal pancreas, recurrent acute pancreatitis Providers:                Milus Banister, MD, Clyde Lundborg, RN, Glori Bickers, RN, Cherylynn Ridges, Technician, Enrigue Catena,                            CRNA Referring MD:              Medicines:                Monitored Anesthesia Care Complications:            No immediate complications. Estimated blood loss:                            None. Estimated Blood Loss:     Estimated blood loss: none. Procedure:                Pre-Anesthesia Assessment:                           - Prior to the procedure, a History and Physical                            was performed, and patient medications and                            allergies were reviewed. The patient's tolerance of                            previous anesthesia was also reviewed. The risks                            and benefits of the procedure and the sedation                            options and risks were discussed with the patient.                            All questions were answered, and informed consent                            was obtained. Prior Anticoagulants: The patient has                            taken Xarelto (rivaroxaban), last dose was 2 days                            prior  to procedure. ASA Grade Assessment: IV - A                            patient with severe systemic disease that is a                            constant threat to life. After reviewing the risks                            and benefits, the patient was deemed in                            satisfactory condition to undergo the procedure.                           After obtaining informed consent, the  endoscope was                            passed under direct vision. Throughout the                            procedure, the patient's blood pressure, pulse, and                            oxygen saturations were monitored continuously. The                            [id number]-[id number] ([id number]) Olympus Radial EUS was                            introduced through the mouth, and advanced to the                            second part of duodenum. The [id number] ([id number])                            Olympus Linear EUS was introduced through the                            mouth, and advanced to the second part of duodenum.                            The upper EUS was accomplished without difficulty.                            The patient tolerated the procedure well. Scope In: Scope Out: Findings:      ENDOSCOPIC FINDING: :      The examined esophagus was endoscopically normal.      The entire examined stomach was endoscopically normal.      The examined duodenum was endoscopically normal.      ENDOSONOGRAPHIC FINDING: :      1. The parenchyma in the head of pancreas was abnormal. The parenchyma       was edematous. There were  no discrete solid or cystic mass lesions.       There was a 2.5cm region with more than usual lobular, somewhat       hyperchoic features. This did not have discrete borders. Given her       clincal deterioration over the past several months, 'atypical' cells on       EUS FNA 3 months ago, overall weight loss I sampled the head of pancreas       with three transduodenal EUS FNB passes.      2. The remaining pancreatic parenchyma in neck, body and tail was       somewhat lobular but withuot clear features of chronic pancreatitis.      3. Main pancreatic duct was non-dilated.      4. CBD was normal, non-dilated.      5. NO peripancreatic adenopathy. Impression:               - The head of pancreas is abnormal but without                            discrete solid or  (anechoic) cystic lesions. FNA                            was peformed and preliminary review shows no                            neoplastic cells.                           - Given cholelithiasis noted on previous imaging it                            is reasonable to consult general surgery to                            consider cholecystectomy.                           - Would still recommend interval imaging of the                            pancreas (MR In 2-3 months). Moderate Sedation:      Not Applicable - Patient had care per Anesthesia. Recommendation:           - Return patient to hospital ward for ongoing care. Procedure Code(s):        --- Professional ---                           [phone], Esophagogastroduodenoscopy, flexible,                            transoral; with transendoscopic ultrasound-guided                            intramural or transmural fine needle  aspiration/biopsy(s), (includes endoscopic                            ultrasound examination limited to the esophagus,                            stomach or duodenum, and adjacent structures) Diagnosis Code(s):        --- Professional ---                           K86.89, Other specified diseases of pancreas                           R93.3, Abnormal findings on diagnostic imaging of                            other parts of digestive tract CPT copyright 2019 American [employer]. All rights reserved. The codes documented in this report are preliminary and upon coder review may  be revised to meet current compliance requirements. Milus Banister, MD 03/19/2020 12:43:30 PM This report has been signed electronically. Number of Addenda: 0

## 2020-03-19 NOTE — Progress Notes (Signed)
PROGRESS NOTE    [name]    Code Status: Full Code  ENI:[id number] DOB: 1960/11/19 DOA: 03/17/2020 LOS: 2 days  PCP: [name], DO CC:  Chief Complaint  Patient presents with  . Abdominal Pain       Hospital Summary   This is a 59 year old female with history of recurrent idiopathic pancreatitis, paroxysmal A. fib, CAD s/p CABG, hypertension, anxiety, recent CVA, recent fall in March, AAA who presented to the ED on 6/22 with persistent epigastric abdominal pain with associated nausea and vomiting.  Had been taking Flomax in order to pass a kidney stone recently and was having some hematuria but this had resolved.  ED Course: Afebrile, mildly tachycardic, BP 136/96.  CBC notable for leukocytosis 16.8, hemoconcentration with hemoglobin 15.4.  BMP was notable for potassium of 3.0 and glucose 138.  Lipase elevated 6271.  Urinalysis was consistent with infection and reflex to culture.  Chest x-ray negative.  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  GI was consulted for recurrent pancreatitis.    6/24: EGD/EUS with Dr. Ardis Hughs.  Abnormal head of pancreas without discrete solid or anechoic cystic lesions.  FNA performed and preliminary review showing no neoplastic cells.  CBD normal and nondilated.  GI recommended interval imaging of the pancreas with MRI in 2 to 3 months as well as general surgery consult to consider cholecystectomy.  A & P   Principal Problem:   Acute on chronic pancreatitis (HCC) Active Problems:   Cerebrovascular accident (CVA) due to occlusion of left middle cerebral artery (HCC)   Hyperlipidemia LDL goal <70   Hypokalemia    Benign essential HTN   Chronic diastolic CHF (congestive heart failure) (HCC)   Paroxysmal atrial fibrillation (HCC)   Anxiety   Recurrent pancreatitis   1. Recurrent acute on chronic idiopathic pancreatitis with abnormal finding of pancreatic head s/p EGD/EUS with Dr. Ardis Hughs on 6/24 a. S/p EUS with FNA in March 2021 with atypical cells thought to be inflammatory at the time.  Sludge noted in gallbladder.  CT scan in May showed improved inflammatory changes and decreased size of suspected pseudocyst b. Reactive leukocytosis c. EGD/EUS with Dr. Ardis Hughs 6/24:.  Abnormal head of pancreas without discrete solid or anechoic cystic lesions.  FNA performed and preliminary review showing no neoplastic cells.  CBD normal and nondilated.  GI recommended interval imaging of the pancreas with MRI in 2 to 3 months as well as general surgery consult to consider cholecystectomy.  2. Concern for lytic lesion of right greater trochanter with associated pathologic fracture a. Recent fall in March in setting of her CVA at the time with negative imaging for fracture at the time b. Now with new, incidental finding on CT pelvis c. MRI ordered for further evaluation  3. Reactive leukocytosis, resolved  4. Tick removed from posterior neck a. Unknown duration b. 6/23 incidental finding on the posterior neck by nursing staff.  Removed.  Unfortunately no picture was obtained c. No sign of tickborne illness, will hold off on empiric treatment  5. Asymptomatic E. coli UTI a. Urine cultures pending b. Hold antibiotics for now.  Will start if she becomes febrile or has symptoms  6. History of nephrolithiasis, resolved a. On Flomax  7. Hypotension a. Noted overnight however resolved after recycling BP without intervention b. Continue to monitor, no intervention at this time  8. History of gastritis/duodenitis a. Change IV PPI to p.o. daily as EGD was unremarkable  9. Hyperlipidemia    10. Hypertension a. Continue low-dose Coreg for now  11. Chronic diastolic heart failure, not in acute exacerbation a. Will monitor for volume overload as she is getting high volumes of fluids pancreatitis b. Daily weights and I/O  12. Paroxysmal A. fib on Xarelto and Coreg a. Hold Xarelto as above -pending GI recommendations  13. Anxiety stable on home Vistaril  14. Stable infrarenal AAA a. Repeat ultrasound in 3 years for surveillance  DVT prophylaxis: Place and maintain sequential compression device Start: 03/18/20 1526 Place and maintain sequential compression device Start: 03/18/20 1526   Family Communication: No family at bedside  Disposition Plan:  Status is: Inpatient  Remains inpatient appropriate because:IV treatments appropriate due to intensity of illness or inability to take PO and Inpatient level of care appropriate due to severity of illness   Dispo: The patient is from: Home              Anticipated d/c is to: Home              Anticipated d/c date is: 2 days              Patient currently is not medically stable to d/c.          Pressure injury documentation    None  Consultants  GI   Procedures  None  Antibiotics   Anti-infectives (From admission, onward)   Start     Dose/Rate Route Frequency Ordered Stop   03/17/20 1100  cefTRIAXone (ROCEPHIN) 1 g in sodium chloride 0.9 % 100 mL IVPB        1 g 200 mL/hr over 30 Minutes Intravenous  Once 03/17/20 1046 03/17/20 1551       Pretension Subjective   Patient states she noted a swelling on the back of her neck yesterday evening which happened to be a tick which was noticed by the nurse.  This was removed yesterday evening and communicated to me but unfortunately was not documented.  Patient states that [place]  Does not know how long the tick was present for and denies any myalgias or other systemic findings.  Otherwise she is feeling a  bit better and was able to tolerate diet yesterday.  Overnight she was noted to be hypotensive and an IV was placed however upon repeat BP her blood pressure had resolved.  No other issues. Objective   Vitals:   03/19/20 1240 03/19/20 1250 03/19/20 1307 03/19/20 1337  BP: (!) 157/96 (!) 164/89  116/70  Pulse: (!) 107 99  91  Resp: 19 16  18   Temp:    98.1 F (36.7 C)  TempSrc:    Oral  SpO2: 90% 94% 93% 93%  Weight:      Height:        Intake/Output Summary (Last 24 hours) at 03/19/2020 1340 Last data filed at 03/19/2020 0600 Gross per 24 hour  Intake 2704.17 ml  Output 0 ml  Net 2704.17 ml   Filed Weights   03/17/20 0658 03/19/20 1032  Weight: 52.6 kg 52.6 kg    Examination:  Physical Exam Vitals and nursing note reviewed.  Constitutional:  Appearance: Normal appearance.  HENT:     Head: Normocephalic and atraumatic.  Eyes:     Conjunctiva/sclera: Conjunctivae normal.  Cardiovascular:     Rate and Rhythm: Normal rate and regular rhythm.  Pulmonary:     Effort: Pulmonary effort is normal.     Breath sounds: Normal breath sounds.  Abdominal:     General: Abdomen is flat.     Palpations: Abdomen is soft.  Musculoskeletal:        General: No swelling or tenderness.  Skin:    Coloration: Skin is not jaundiced or pale.       Neurological:     Mental Status: She is alert. Mental status is at baseline.  Psychiatric:        Mood and Affect: Mood normal.        Behavior: Behavior normal.     Data Reviewed: I have personally reviewed following labs and imaging studies  CBC: Recent Labs  Lab 03/17/20 0231 03/18/20 0548 03/19/20 0504  WBC 16.8* 15.7* 8.1  HGB 15.4* 15.9* 12.5  HCT 47.5* 48.1* 38.5  MCV 98.8 95.4 95.8  PLT 377 362 300   Basic Metabolic Panel: Recent Labs  Lab 03/17/20 0231 03/17/20 0817 03/18/20 0548 03/19/20 0504  NA 138  --  132* 139  K 3.0*  --  3.7 3.4*  CL 103  --  104 107  CO2 27  --  21* 25  GLUCOSE 138*  --  83 76   BUN 11  --  10 10  CREATININE 0.58  --  0.47 0.46  CALCIUM 8.8*  --  8.5* 8.3*  MG  --  2.2 2.0  --    GFR: Estimated Creatinine Clearance: 54.4 mL/min (by C-G formula based on SCr of 0.46 mg/dL). Liver Function Tests: Recent Labs  Lab 03/17/20 0231 03/18/20 0548  AST 19 20  ALT 15 13  ALKPHOS 70 62  BILITOT 0.4 0.5  PROT 7.2 6.2*  ALBUMIN 4.2 3.2*   Recent Labs  Lab 03/17/20 0231  LIPASE 6,271*   No results for input(s): AMMONIA in the last 168 hours. Coagulation Profile: Recent Labs  Lab 03/17/20 0817  INR 1.2   Cardiac Enzymes: No results for input(s): CKTOTAL, CKMB, CKMBINDEX, TROPONINI in the last 168 hours. BNP (last 3 results) No results for input(s): PROBNP in the last 8760 hours. HbA1C: No results for input(s): HGBA1C in the last 72 hours. CBG: No results for input(s): GLUCAP in the last 168 hours. Lipid Profile: No results for input(s): CHOL, HDL, LDLCALC, TRIG, CHOLHDL, LDLDIRECT in the last 72 hours. Thyroid Function Tests: No results for input(s): TSH, T4TOTAL, FREET4, T3FREE, THYROIDAB in the last 72 hours. Anemia Panel: No results for input(s): VITAMINB12, FOLATE, FERRITIN, TIBC, IRON, RETICCTPCT in the last 72 hours. Sepsis Labs: Recent Labs  Lab 03/17/20 0817  LATICACIDVEN 1.1    Recent Results (from the past 240 hour(s))  Blood Culture (routine x 2)     Status: None (Preliminary result)   Collection Time: 03/17/20  8:17 AM   Specimen: BLOOD LEFT HAND  Result Value Ref Range Status   Specimen Description   Final    BLOOD LEFT HAND Performed at [facility], [place] [address], [place], [place] [address]    Special Requests   Final    BOTTLES DRAWN AEROBIC AND ANAEROBIC Blood Culture results may not be optimal due to an inadequate volume of blood received in culture bottles Performed at [facility],  [place] [address] [place], [place] [address]    Culture   Final    NO GROWTH 2 DAYS Performed at [place] [address], [place], [place] [address]    Report Status PENDING  Incomplete  Blood Culture (routine x 2)     Status: None (Preliminary result)   Collection Time: 03/17/20  9:14 AM   Specimen: BLOOD  Result Value Ref Range Status   Specimen Description   Final    BLOOD RIGHT ANTECUBITAL Performed at [place] [address] [place], [place] [address]    Special Requests   Final    BOTTLES DRAWN AEROBIC AND ANAEROBIC Blood Culture results may not be optimal due to an excessive volume of blood received in culture bottles Performed at [place] [address], [place], [place] [address]    Culture   Final    NO GROWTH 2 DAYS Performed at [place] [address], [place], [place] [address]    Report Status PENDING  Incomplete  Urine culture     Status: Abnormal   Collection Time: 03/17/20  9:48 AM   Specimen: Urine, Clean Catch  Result Value Ref Range Status   Specimen Description   Final    URINE, CLEAN CATCH Performed at [facility], [place] [address] [place], [place] [address]    Special Requests   Final    NONE Performed at [facility], [place] [address], [place], [place] [address]    Culture >=100,000 COLONIES/mL ESCHERICHIA COLI (A)  Final   Report Status 03/19/2020 FINAL  Final   Organism ID, Bacteria ESCHERICHIA COLI (A)  Final      Susceptibility   Escherichia coli - MIC*    AMPICILLIN <=2 SENSITIVE Sensitive     CEFAZOLIN <=4 SENSITIVE Sensitive     CEFTRIAXONE <=0.25 SENSITIVE Sensitive     CIPROFLOXACIN <=0.25 SENSITIVE Sensitive     GENTAMICIN <=1 SENSITIVE Sensitive     IMIPENEM <=0.25 SENSITIVE Sensitive     NITROFURANTOIN <=16 SENSITIVE Sensitive     TRIMETH/SULFA <=20 SENSITIVE Sensitive     AMPICILLIN/SULBACTAM <=2 SENSITIVE Sensitive     PIP/TAZO <=4 SENSITIVE Sensitive     * >=100,000 COLONIES/mL ESCHERICHIA COLI  SARS Coronavirus 2 by RT PCR  (hospital order, performed in [place] hospital lab) Nasopharyngeal Nasopharyngeal Swab     Status: None   Collection Time: 03/17/20  9:58 PM   Specimen: Nasopharyngeal Swab  Result Value Ref Range Status   SARS Coronavirus 2 NEGATIVE NEGATIVE Final    Comment: (NOTE) SARS-CoV-2 target nucleic acids are NOT DETECTED.  The SARS-CoV-2 RNA is generally detectable in upper and lower respiratory specimens during the acute phase of infection. The lowest concentration of SARS-CoV-2 viral copies this assay can detect is 250 copies / mL. A negative result does not preclude SARS-CoV-2 infection and should not be used as the sole basis for treatment or other patient management decisions.  A negative result may occur with improper specimen collection / handling, submission of specimen other than nasopharyngeal swab, presence of viral mutation(s) within the areas targeted by this assay, and inadequate number of viral copies (<250 copies / mL). A negative result must be combined with clinical observations, patient history, and epidemiological information.  Fact Sheet for Patients:   StrictlyIdeas.no  Fact Sheet for Healthcare Providers: BankingDealers.co.za  This test is not yet approved or  cleared by the [place] FDA and  has been authorized for detection and/or diagnosis of SARS-CoV-2 by FDA under an Emergency Use Authorization (EUA).  This EUA will remain in effect (meaning this test can be used) for the duration of the COVID-19 declaration under Section 564(b)(1) of the Act, 21 U.S.C. section 360bbb-3(b)(1), unless the authorization is terminated or revoked sooner.  Performed at [facility], [place] [address], [place], [place] [address]   Surgical pcr screen     Status: None   Collection Time: 03/19/20  3:20 AM   Specimen: Nasal Mucosa; Nasal Swab  Result Value Ref Range Status   MRSA, PCR NEGATIVE NEGATIVE Final    Staphylococcus aureus NEGATIVE NEGATIVE Final    Comment: (NOTE) The Xpert SA Assay (FDA approved for NASAL specimens in patients 31 years of age and older), is one component of a comprehensive surveillance program. It is not intended to diagnose infection nor to guide or monitor treatment. Performed at [facility], [place] [address], [place], [place] [address]          Radiology Studies: No results found.      Scheduled Meds: . baclofen  10 mg Oral BID  . calcium carbonate  1 tablet Oral BID  . carvedilol  3.125 mg Oral BID WC  . cetirizine  10 mg Oral Daily  . hydrOXYzine  10 mg Oral QHS  . pantoprazole (PROTONIX) IV  40 mg Intravenous Q12H  . tamsulosin  0.4 mg Oral Daily   Continuous Infusions: . lactated ringers 900 mL/hr at 03/19/20 1225     Time spent: 25 minutes with over 50% of the time coordinating the patient's care    Harold Hedge, DO [facility] Pager [phone]  Call night coverage person covering after 7pm

## 2020-03-19 NOTE — Consult Note (Addendum)
[name] 1961-05-20  [id number].    Requesting MD: Dr. Ardis Hughs Chief Complaint/Reason for Consult: Recurrent Pancreatitis, Gallbladder Sludge  HPI: [name] is a 59 y.o. female with a history of HTN, HLD, dCHF, A. Fib on Xarelto (last dose 6/22), prior CVA (2018), CAD (s/p CABG x 2 in 2019) who presented with abdominal pain and was found to have recurrent pancreatitis.   This is the patients 5th admission since December of 2020 for pancreatitis (12/15-12/18/2020, 1/15-1/18/2021, 2/11-2/13/21, 3/5-3/10/21 and now). Patients workup thus far has been without definitive cause of her pancreatitis.  Triglycerides not elevated.  Autoimmune panel negative.  She did have a EUS on 3/8 where an area of hypoechogenicity was identified in the pancreatic head and fine needle biopsy was performed that showed atypical cells. At the time this was felt to be most consistent with likely an inflammatory process. Patient presented to the ED on 6/22 for abdominal pain.  She reports after eating at [employer] she went home and began having severe, deep, constant upper abdominal pain with associated nausea, dry heaving and emesis.  She was found to have pancreatitis with elevated lipase at 6271 and a rounded low density is noted in the pancreatic head which appears to be enlarged compared to prior exams, currently measuring 31 x 33mm. CT also showed a lytic destruction involving the greater trochanter of the proximal right femur concerning for possible pathologic fracture. Patient denies any pain of the right leg/hip and has been ambulating without difficulty. She underwent EUS today where she was found to have abnormal parenchyma in the head of pancreas was abnormal that was was edematous, lobular, with some hyperchoic features. General surgery was asked to see to consider possible cholecystectomy given findings of sludge on prior imaging.   Patient reports that she has lost 15lbs since Dec 2020. She is not  able to eat large meals at home as she is afraid of having recurrent symptoms. Her pain has improved since admission and is currently mild if she is lying still. She has never had abdominal surgery before. She does not drink alcohol. She smokes on occasion. No illicit drug use. Husband is at bedside.   ROS: Review of Systems  Constitutional: Positive for weight loss. Negative for chills and fever.  Respiratory: Negative for cough and shortness of breath.   Cardiovascular: Negative for chest pain.  Gastrointestinal: Positive for abdominal pain, nausea and vomiting. Negative for blood in stool, constipation and diarrhea.  Genitourinary: Negative for dysuria.  Musculoskeletal: Negative for back pain.  Psychiatric/Behavioral: Negative for substance abuse.  All other systems reviewed and are negative.   Family History  Problem Relation Age of Onset  . Heart attack Father   . Hypertension Mother   . Hyperlipidemia Mother   . Diabetes type II Mother   . Urticaria Mother   . Hypertension Brother   . Breast cancer Cousin 5  . Immunodeficiency Neg Hx   . Eczema Neg Hx   . Atopy Neg Hx   . Asthma Neg Hx   . Angioedema Neg Hx   . Allergic rhinitis Neg Hx   . Colon cancer Neg Hx   . Esophageal cancer Neg Hx   . Stomach cancer Neg Hx   . Pancreatic cancer Neg Hx   . Colon polyps Neg Hx   . Esophageal varices Neg Hx   . Inflammatory bowel disease Neg Hx   . Rectal cancer Neg Hx     Past Medical History:  Diagnosis Date  . Angio-edema   . Arthritis   . CAD (coronary artery disease)    a. s/p NSTEMI in 10/2017 with cath showing LM disease --> s/p CABG on 11/23/2017 with LIMA-LAD and SVG-OM.   [name] Hay fever   . Ischemic cardiomyopathy    a. EF 35-40% by echo in 10/2017  . NSTEMI (non-ST elevated myocardial infarction) ([facility])   . PAF (paroxysmal atrial fibrillation) ([place])    Diagnosed by ILR  . Pancreatitis   . Pneumonia 11/16/2017  . Stroke [facility]) 08/23/2017   Left MCA infarct status  post TPA and thrombectomy  . Urticaria     Past Surgical History:  Procedure Laterality Date  . BIOPSY  12/02/2019   Procedure: BIOPSY;  Surgeon: Rush Landmark Telford Nab., MD;  Location: [place];  Service: Gastroenterology;;  . CORONARY ARTERY BYPASS GRAFT N/A 11/23/2017   Procedure: CORONARY ARTERY BYPASS GRAFTING (CABG) x 2, ON PUMP, USING LEFT INTERNAL MAMMARY ARTERY AND RIGHT GREATER SAPHENOUS VEIN HARVESTED ENDOSCOPICALLY;  Surgeon: Gaye Pollack, MD;  Location: [place];  Service: Open Heart Surgery;  Laterality: N/A;  . CRYOABLATION     cervical  . ESOPHAGOGASTRODUODENOSCOPY (EGD) WITH PROPOFOL N/A 12/02/2019   Procedure: ESOPHAGOGASTRODUODENOSCOPY (EGD) WITH PROPOFOL;  Surgeon: Rush Landmark Telford Nab., MD;  Location: [place];  Service: Gastroenterology;  Laterality: N/A;  . FINE NEEDLE ASPIRATION  12/02/2019   Procedure: FINE NEEDLE ASPIRATION (FNA) LINEAR;  Surgeon: Irving Copas., MD;  Location: [place];  Service: Gastroenterology;;  . IR ANGIO INTRA EXTRACRAN SEL COM CAROTID INNOMINATE UNI R MOD SED  08/21/2017  . IR ANGIO VERTEBRAL SEL SUBCLAVIAN INNOMINATE UNI R MOD SED  08/21/2017  . IR PERCUTANEOUS ART THROMBECTOMY/INFUSION INTRACRANIAL INC DIAG ANGIO  08/21/2017  . IR RADIOLOGIST EVAL & MGMT  10/30/2017  . LOOP RECORDER INSERTION N/A 08/28/2017   Procedure: LOOP RECORDER INSERTION;  Surgeon: Constance Haw, MD;  Location: [place] CV LAB;  Service: Cardiovascular;  Laterality: N/A;  . RADIOLOGY WITH ANESTHESIA N/A 08/21/2017   Procedure: RADIOLOGY WITH ANESTHESIA;  Surgeon: Luanne Bras, MD;  Location: [place];  Service: Radiology;  Laterality: N/A;  . RIGHT/LEFT HEART CATH AND CORONARY ANGIOGRAPHY N/A 11/20/2017   Procedure: RIGHT/LEFT HEART CATH AND CORONARY ANGIOGRAPHY;  Surgeon: Jettie Booze, MD;  Location: [place] [facility];  Service: Cardiovascular;  Laterality: N/A;  . TEE WITHOUT CARDIOVERSION N/A 08/28/2017   Procedure:  TRANSESOPHAGEAL ECHOCARDIOGRAM (TEE);  Surgeon: Fay Records, MD;  Location: [place];  Service: Cardiovascular;  Laterality: N/A;  . TEE WITHOUT CARDIOVERSION N/A 11/23/2017   Procedure: TRANSESOPHAGEAL ECHOCARDIOGRAM (TEE);  Surgeon: Gaye Pollack, MD;  Location: [place];  Service: Open Heart Surgery;  Laterality: N/A;  . TUBAL LIGATION    . UPPER ESOPHAGEAL ENDOSCOPIC ULTRASOUND (EUS) N/A 12/02/2019   Procedure: UPPER ESOPHAGEAL ENDOSCOPIC ULTRASOUND (EUS);  Surgeon: Irving Copas., MD;  Location: [place];  Service: Gastroenterology;  Laterality: N/A;    Social History:  reports that she has been smoking cigarettes. She has a 3.70 pack-year smoking history. She has never used smokeless tobacco. She reports that she does not drink alcohol and does not use drugs.  Allergies:  Allergies  Allergen Reactions  . Dilaudid [Hydromorphone] Other (See Comments)    Hallucinations  . Lopressor [Metoprolol Tartrate] Swelling    Angioedema  . Allegra Allergy [Fexofenadine Hcl] Hives  . Nsaids     On Xarelto  . Gabapentin (Once-Daily) Rash  . Lipitor [Atorvastatin] Diarrhea  Medications Prior to Admission  Medication Sig Dispense Refill  . acetaminophen (TYLENOL) 325 MG tablet Take 2 tablets (650 mg total) by mouth every 6 (six) hours as needed for mild pain or moderate pain (or Fever >/= 101).    [name] azelastine (ASTELIN) 0.1 % nasal spray Place 1-2 sprays into both nostrils 2 (two) times daily as needed for rhinitis. Use in each nostril as directed 30 mL 5  . baclofen (LIORESAL) 10 MG tablet Take 10 mg by mouth in the morning and at bedtime.    . calcium carbonate (TUMS - DOSED IN MG ELEMENTAL CALCIUM) 500 MG chewable tablet Chew 1 tablet by mouth 2 (two) times daily.     . carvedilol (COREG) 3.125 MG tablet Take 1 tablet (3.125 mg total) by mouth in the morning and at bedtime. 180 tablet 3  . cholecalciferol (VITAMIN D) 1000 units tablet Take 1,000 Units by mouth daily.    .  famotidine (PEPCID) 20 MG tablet Take 1 tablet (20 mg total) by mouth 2 (two) times daily. 180 tablet 1  . ferrous sulfate 325 (65 FE) MG tablet Take 325 mg by mouth 2 (two) times a week. Mondays  and Thursdays.    . hydrOXYzine (ATARAX/VISTARIL) 10 MG tablet Take 1 tablet (10 mg total) by mouth at bedtime. 90 tablet 1  . levocetirizine (XYZAL) 5 MG tablet Take 1 tablet (5 mg total) by mouth every evening. 180 tablet 1  . omeprazole (PRILOSEC) 40 MG capsule Take 1 capsule (40 mg total) by mouth daily. 30 capsule 3  . ondansetron (ZOFRAN ODT) 4 MG disintegrating tablet Take 1 tablet (4 mg total) by mouth every 8 (eight) hours as needed for nausea or vomiting. 30 tablet 0  . pantoprazole (PROTONIX) 40 MG tablet Take 40 mg by mouth daily.    . pravastatin (PRAVACHOL) 20 MG tablet Take 1 tablet (20 mg total) by mouth every evening. 90 tablet 1  . rivaroxaban (XARELTO) 20 MG TABS tablet Take 1 tablet daily with evening meal (Patient taking differently: Take 20 mg by mouth daily with supper. ) 90 tablet 3  . tamsulosin (FLOMAX) 0.4 MG CAPS capsule Take 1 capsule (0.4 mg total) by mouth daily. 30 capsule 0  . traMADol (ULTRAM) 50 MG tablet TAKE 1 TO 2 TABLETS BY MOUTH EVERY 8 HOURS AS NEEDED FOR MODERATE PAIN (Patient taking differently: Take 50-100 mg by mouth every 8 (eight) hours as needed for moderate pain. ) 180 tablet 3  . vitamin B-12 (CYANOCOBALAMIN) 100 MCG tablet Take 100 mcg by mouth daily.       Physical Exam: Blood pressure 118/73, pulse 95, temperature 98.1 F (36.7 C), temperature source Oral, resp. rate 16, height 5' (1.524 m), weight 52.6 kg, SpO2 92 %. General: pleasant, frail white female that appears older than stated age, who is laying in bed in NAD HEENT: head is normocephalic, atraumatic.  Sclera are noninjected.  PERRL.  Ears and nose without any masses or lesions.  Mouth is pink and moist. Dentition fair Heart: Irregular rhythm with regular rate. No obvious murmur. Palpable  pedal pulses bilaterally.  Prior sternotomy scar is well-healed. Lungs: CTAB, no wheezes, rhonchi, or rales noted.  Respiratory effort nonlabored. Abd: Soft, ND, tenderness of the epigastrium and RUQ without peritonitis. +BS, no masses, hernias, or organomegaly.  MS: no BUE/BLE edema, calves soft and nontender Skin: warm and dry with no masses, lesions, or rashes Psych: A&Ox4 with an appropriate affect Neuro: cranial nerves grossly intact, equal strength  in BUE/BLE bilaterally, normal speech, though process intact  Results for orders placed or performed during the hospital encounter of 03/17/20 (from the past 48 hour(s))  SARS Coronavirus 2 by RT PCR (hospital order, performed in [facility] hospital lab) Nasopharyngeal Nasopharyngeal Swab     Status: None   Collection Time: 03/17/20  9:58 PM   Specimen: Nasopharyngeal Swab  Result Value Ref Range   SARS Coronavirus 2 NEGATIVE NEGATIVE    Comment: (NOTE) SARS-CoV-2 target nucleic acids are NOT DETECTED.  The SARS-CoV-2 RNA is generally detectable in upper and lower respiratory specimens during the acute phase of infection. The lowest concentration of SARS-CoV-2 viral copies this assay can detect is 250 copies / mL. A negative result does not preclude SARS-CoV-2 infection and should not be used as the sole basis for treatment or other patient management decisions.  A negative result may occur with improper specimen collection / handling, submission of specimen other than nasopharyngeal swab, presence of viral mutation(s) within the areas targeted by this assay, and inadequate number of viral copies (<250 copies / mL). A negative result must be combined with clinical observations, patient history, and epidemiological information.  Fact Sheet for Patients:   StrictlyIdeas.no  Fact Sheet for Healthcare Providers: BankingDealers.co.za  This test is not yet approved or  cleared by the [place] FDA and has been authorized for detection and/or diagnosis of SARS-CoV-2 by FDA under an Emergency Use Authorization (EUA).  This EUA will remain in effect (meaning this test can be used) for the duration of the COVID-19 declaration under Section 564(b)(1) of the Act, 21 U.S.C. section 360bbb-3(b)(1), unless the authorization is terminated or revoked sooner.  Performed at [facility], [place] [address], [place], [place] [address]   Comprehensive metabolic panel     Status: Abnormal   Collection Time: 03/18/20  5:48 AM  Result Value Ref Range   Sodium 132 (L) 135 - 145 mmol/L   Potassium 3.7 3.5 - 5.1 mmol/L    Comment: DELTA CHECK NOTED NO VISIBLE HEMOLYSIS    Chloride 104 98 - 111 mmol/L   CO2 21 (L) 22 - 32 mmol/L   Glucose, Bld 83 70 - 99 mg/dL    Comment: Glucose reference range applies only to samples taken after fasting for at least 8 hours.   BUN 10 6 - 20 mg/dL   Creatinine, Ser 0.47 0.44 - 1.00 mg/dL   Calcium 8.5 (L) 8.9 - 10.3 mg/dL   Total Protein 6.2 (L) 6.5 - 8.1 g/dL   Albumin 3.2 (L) 3.5 - 5.0 g/dL   AST 20 15 - 41 U/L   ALT 13 0 - 44 U/L   Alkaline Phosphatase 62 38 - 126 U/L   Total Bilirubin 0.5 0.3 - 1.2 mg/dL   GFR calc non Af Amer >60 >60 mL/min   GFR calc Af Amer >60 >60 mL/min   Anion gap 7 5 - 15    Comment: Performed at [facility], [place] [address] [place], [place] [address]  CBC     Status: Abnormal   Collection Time: 03/18/20  5:48 AM  Result Value Ref Range   WBC 15.7 (H) 4.0 - 10.5 K/uL   RBC 5.04 3.87 - 5.11 MIL/uL   Hemoglobin 15.9 (H) 12.0 - 15.0 g/dL   HCT 48.1 (H) 36 - 46 %   MCV 95.4 80.0 - 100.0 fL   MCH 31.5 26.0 - 34.0 pg   MCHC 33.1 30.0 - 36.0  g/dL   RDW 13.0 11.5 - 15.5 %   Platelets 362 150 - 400 K/uL   nRBC 0.0 0.0 - 0.2 %    Comment: Performed at [facility], [place] [address] [place], [place] [address]  Magnesium     Status: None   Collection Time: 03/18/20   5:48 AM  Result Value Ref Range   Magnesium 2.0 1.7 - 2.4 mg/dL    Comment: Performed at [facility], [place] [address] [place], [place] [address]  Surgical pcr screen     Status: None   Collection Time: 03/19/20  3:20 AM   Specimen: Nasal Mucosa; Nasal Swab  Result Value Ref Range   MRSA, PCR NEGATIVE NEGATIVE   Staphylococcus aureus NEGATIVE NEGATIVE    Comment: (NOTE) The Xpert SA Assay (FDA approved for NASAL specimens in patients 39 years of age and older), is one component of a comprehensive surveillance program. It is not intended to diagnose infection nor to guide or monitor treatment. Performed at [facility], [place] [address], [place], [place] [address]   CBC     Status: None   Collection Time: 03/19/20  5:04 AM  Result Value Ref Range   WBC 8.1 4.0 - 10.5 K/uL   RBC 4.02 3.87 - 5.11 MIL/uL   Hemoglobin 12.5 12.0 - 15.0 g/dL   HCT 38.5 36 - 46 %   MCV 95.8 80.0 - 100.0 fL   MCH 31.1 26.0 - 34.0 pg   MCHC 32.5 30.0 - 36.0 g/dL   RDW 13.0 11.5 - 15.5 %   Platelets 300 150 - 400 K/uL   nRBC 0.0 0.0 - 0.2 %    Comment: Performed at [facility], [place] [address] [place], [place] [address]  Basic metabolic panel     Status: Abnormal   Collection Time: 03/19/20  5:04 AM  Result Value Ref Range   Sodium 139 135 - 145 mmol/L    Comment: DELTA CHECK NOTED   Potassium 3.4 (L) 3.5 - 5.1 mmol/L   Chloride 107 98 - 111 mmol/L   CO2 25 22 - 32 mmol/L   Glucose, Bld 76 70 - 99 mg/dL    Comment: Glucose reference range applies only to samples taken after fasting for at least 8 hours.   BUN 10 6 - 20 mg/dL   Creatinine, Ser 0.46 0.44 - 1.00 mg/dL   Calcium 8.3 (L) 8.9 - 10.3 mg/dL   GFR calc non Af Amer >60 >60 mL/min   GFR calc Af Amer >60 >60 mL/min   Anion gap 7 5 - 15    Comment: Performed at [facility], [place] [address] [place], [place] [address]   No results found.  Anti-infectives (From  admission, onward)   Start     Dose/Rate Route Frequency Ordered Stop   03/17/20 1100  cefTRIAXone (ROCEPHIN) 1 g in sodium chloride 0.9 % 100 mL IVPB        1 g 200 mL/hr over 30 Minutes Intravenous  Once 03/17/20 1046 03/17/20 1551       Assessment/Plan HTN HLD 3.3 cm infrarenal abdominal aortic aneurysm - outpatient follow up Hx dCHF A. Fib on Xarelto (last dose 6/22) Hx of prior CVA (2018) CAD (s/p CABG x 2 in 2019) - Cardiology, Dr. Carney Living. Coli UTI  Recurrent Pancreatitis Gallbladder sludge Lytic Lesion of the greater trochanter of the proximal right femur concerning for possible pathologic fracture - Patient is s/p  EUS today where she was found to have abnormal lobular, edematous parenchyma in the head of pancreas. Await FNA results - With patients hx of patient's unintentional weight loss, lytic lesion of her greater trochanter and abnormal lobular pancreas on EUS today there is concern for malignant etiology.  Will await FNA results and I have ordered a MRI of the abdomen with pancreatic protocol to be done at the same time of MRI hip that is already being done to evaluate her lytic lesion. -The patient does have a gallbladder sludge so if above work-up is negative, could consider gallbladder as etiology of recurrent pancreatitis.  Will start on Actigall in the interim and have cardiology evaluate risk if Laparoscopic Cholecystectomy was indicated.  - We will follow along with you  FEN - CLD per GI VTE - SCDs, okay for heparin from our standpoint ID - Rocephin x 1 for UTI - Per TRH. None indicated from our standpoint.   Jillyn Ledger, [facility] Surgery 03/19/2020, 2:32 PM Please see Amion for pager number during day hours 7:00am-4:30pm

## 2020-03-19 NOTE — Anesthesia Preprocedure Evaluation (Addendum)
Anesthesia Evaluation  Patient identified by MRN, date of birth, ID band Patient awake    Reviewed: Allergy & Precautions, NPO status , Patient's Chart, lab work & pertinent test results  History of Anesthesia Complications Negative for: history of anesthetic complications  Airway Mallampati: II  TM Distance: >3 FB Neck ROM: Full    Dental  (+) Dental Advisory Given, Teeth Intact   Pulmonary Current Smoker and Patient abstained from smoking.,    Pulmonary exam normal        Cardiovascular hypertension, Pt. on medications and Pt. on home beta blockers + CAD, + Past MI, + CABG (2019) and +CHF  Normal cardiovascular exam+ dysrhythmias Atrial Fibrillation      Neuro/Psych PSYCHIATRIC DISORDERS Anxiety CVA, Residual Symptoms    GI/Hepatic Neg liver ROS, GERD  Medicated and Controlled, Chronic pancreatitis    Endo/Other  negative endocrine ROS  Renal/GU negative Renal ROS     Musculoskeletal  (+) Arthritis ,   Abdominal   Peds  Hematology negative hematology ROS (+)   Anesthesia Other Findings Covid test negative   Reproductive/Obstetrics                            Anesthesia Physical Anesthesia Plan  ASA: III  Anesthesia Plan: MAC   Post-op Pain Management:    Induction: Intravenous  PONV Risk Score and Plan: 2 and Propofol infusion and Treatment may vary due to age or medical condition  Airway Management Planned: Nasal Cannula and Natural Airway  Additional Equipment: None  Intra-op Plan:   Post-operative Plan:   Informed Consent: I have reviewed the patients History and Physical, chart, labs and discussed the procedure including the risks, benefits and alternatives for the proposed anesthesia with the patient or authorized representative who has indicated his/her understanding and acceptance.       Plan Discussed with: CRNA and Anesthesiologist  Anesthesia Plan  Comments:        Anesthesia Quick Evaluation

## 2020-03-19 NOTE — Anesthesia Procedure Notes (Addendum)
Procedure Name: MAC Date/Time: 03/19/2020 11:15 AM Performed by: Lissa Morales, CRNA Pre-anesthesia Checklist: Patient identified, Emergency Drugs available, Suction available, Patient being monitored and Timeout performed Patient Re-evaluated:Patient Re-evaluated prior to induction Oxygen Delivery Method: Simple face mask Placement Confirmation: positive ETCO2

## 2020-03-19 NOTE — Anesthesia Postprocedure Evaluation (Signed)
Anesthesia Post Note  Patient: [name]  Procedure(s) Performed: UPPER ENDOSCOPIC ULTRASOUND (EUS) RADIAL (N/A ) ESOPHAGOGASTRODUODENOSCOPY (EGD) WITH PROPOFOL (N/A ) UPPER ENDOSCOPIC ULTRASOUND (EUS) LINEAR (N/A ) FINE NEEDLE ASPIRATION (FNA) LINEAR (N/A )     Patient location during evaluation: PACU Anesthesia Type: MAC Level of consciousness: awake and alert Pain management: pain level controlled Vital Signs Assessment: post-procedure vital signs reviewed and stable Respiratory status: spontaneous breathing, nonlabored ventilation and respiratory function stable Cardiovascular status: stable and blood pressure returned to baseline Anesthetic complications: no   No complications documented.  Last Vitals:  Vitals:   03/19/20 1310 03/19/20 1337  BP: (!) 145/76 116/70  Pulse: 98 91  Resp: 19 18  Temp:  36.7 C  SpO2: 95% 93%    Last Pain:  Vitals:   03/19/20 1337  TempSrc: Oral  PainSc:                  Audry Pili

## 2020-03-19 NOTE — Transfer of Care (Signed)
Immediate Anesthesia Transfer of Care Note  Patient: [name]  Procedure(s) Performed: UPPER ENDOSCOPIC ULTRASOUND (EUS) RADIAL (N/A ) ESOPHAGOGASTRODUODENOSCOPY (EGD) WITH PROPOFOL (N/A ) UPPER ENDOSCOPIC ULTRASOUND (EUS) LINEAR (N/A ) FINE NEEDLE ASPIRATION (FNA) LINEAR (N/A )  Patient Location: PACU  Anesthesia Type:MAC  Level of Consciousness: sedated and patient cooperative  Airway & Oxygen Therapy: Patient Spontanous Breathing and Patient connected to face mask oxygen  Post-op Assessment: Report given to RN, Post -op Vital signs reviewed and stable and Patient moving all extremities X 4  Post vital signs: stable  Last Vitals:  Vitals Value Taken Time  BP 156/78 03/19/20 1225  Temp    Pulse 69 03/19/20 1230  Resp 23 03/19/20 1230  SpO2 100 % 03/19/20 1230  Vitals shown include unvalidated device data.  Last Pain:  Vitals:   03/19/20 1032  TempSrc: Oral  PainSc: 7          Complications: No complications documented.

## 2020-03-20 ENCOUNTER — Encounter (HOSPITAL_COMMUNITY): Payer: Self-pay | Admitting: Internal Medicine

## 2020-03-20 DIAGNOSIS — Z01818 Encounter for other preprocedural examination: Secondary | ICD-10-CM

## 2020-03-20 DIAGNOSIS — E7800 Pure hypercholesterolemia, unspecified: Secondary | ICD-10-CM

## 2020-03-20 DIAGNOSIS — I2510 Atherosclerotic heart disease of native coronary artery without angina pectoris: Secondary | ICD-10-CM

## 2020-03-20 LAB — COMPREHENSIVE METABOLIC PANEL
ALT: 11 U/L (ref 0–44)
AST: 15 U/L (ref 15–41)
Albumin: 2.8 g/dL — ABNORMAL LOW (ref 3.5–5.0)
Alkaline Phosphatase: 42 U/L (ref 38–126)
Anion gap: 6 (ref 5–15)
BUN: 7 mg/dL (ref 6–20)
CO2: 27 mmol/L (ref 22–32)
Calcium: 8.3 mg/dL — ABNORMAL LOW (ref 8.9–10.3)
Chloride: 107 mmol/L (ref 98–111)
Creatinine, Ser: 0.55 mg/dL (ref 0.44–1.00)
GFR calc Af Amer: >60 mL/min (ref >60)
GFR calc non Af Amer: >60 mL/min (ref >60)
Glucose, Bld: 83 mg/dL (ref 70–99)
Potassium: 3.7 mmol/L (ref 3.5–5.1)
Sodium: 140 mmol/L (ref 135–145)
Total Bilirubin: 0.5 mg/dL (ref 0.3–1.2)
Total Protein: 4.9 g/dL — ABNORMAL LOW (ref 6.5–8.1)

## 2020-03-20 LAB — LIPASE, BLOOD: Lipase: 189 U/L — ABNORMAL HIGH (ref 11–51)

## 2020-03-20 LAB — CBC
HCT: 38.5 % (ref 36.0–46.0)
Hemoglobin: 12.2 g/dL (ref 12.0–15.0)
MCH: 31.1 pg (ref 26.0–34.0)
MCHC: 31.7 g/dL (ref 30.0–36.0)
MCV: 98.2 fL (ref 80.0–100.0)
Platelets: 279 10*3/uL (ref 150–400)
RBC: 3.92 MIL/uL (ref 3.87–5.11)
RDW: 13.2 % (ref 11.5–15.5)
WBC: 7.5 10*3/uL (ref 4.0–10.5)
nRBC: 0 % (ref 0.0–0.2)

## 2020-03-20 LAB — CYTOLOGY - NON PAP

## 2020-03-20 MED ORDER — POTASSIUM CHLORIDE CRYS ER 20 MEQ PO TBCR
40.0000 meq | EXTENDED_RELEASE_TABLET | Freq: Once | ORAL | Status: AC
  Administered 2020-03-20: 40 meq via ORAL
  Filled 2020-03-20: qty 2

## 2020-03-20 NOTE — Consult Note (Signed)
Cardiology Consultation:   Patient ID: [name] MRN: [id number]; DOB: 24-Oct-1960  Admit date: 03/17/2020 Date of Consult: 03/20/2020  Primary Care Provider: [name], DO [facility] Cardiologist: [name], MD  [facility] Electrophysiologist:  Will Meredith Leeds, MD    Patient Profile:   [name] is a 59 y.o. female with a hx of CAD (s/p NSTEMI in 10/2017 with 3-vessel CAD by cath and CABG on 11/23/2017 with LIMA-LAD and SVG-OM),prior CVA, PAF (picked up on loop recorder, on Xarelto),ischemic cardiomyopathy (EF 35-40% by echo in 10/2017 with subsequent normalization), carotid artery stenosis,HLD, 3.3cm AAA (CT 02/2020)  who is being seen today for the evaluation of pre-operative cardiovascular evaluation at the request of Dr. Dema Severin.  History of Present Illness:   Ms. Goris follows with Dr. Domenic Polite and Dr. Curt Bears with the above history. She was on Entresto in the past for her heart failure but this was stopped due to concern for welts and possible mild angioedema. Last echo in 01/2020 showed EF 55% with mild LVH, grade 1 DD, mild MR. Her most recent ILR interrogations have not demonstrated any recurrence of atrial fibrillation.  She has been admitted several times over the last few months with recurrent pancreatitis with possible pseudocyst. Last lipid panel in 08/2019 showed triglycerides of 85, LDL 158. She previously had an ERCP with brushings showing atypical cells with question of inflammation. She was admitted with persistent epigastric abdominal pain, nausea, vomiting (nonbloody/nonbilious) associated with diaphoresis. She has gradually lost about 15lb in 2 years' time. No fevers or chills. She has been taking Flomax to pass a reported kidney stone. CT of the abdomen/pelvis showed a rounded density in the pancreatic head which has increased in size rapidly since May 7th, thought to represent pseudocyst, but what appears to be lytic destruction of the  right greater trochanter with associated pathologic fracture possibly concerning for metastatic disease. Therefore there is concern for malignancy as well. Labs showed hypokalemia and leukocytosis.  She underwent EUS/FNA yesterday which showed no discrete mass but 2.5cm region with more than usual lobular, somewhat hyperechoic features - biospies were obtained. She was also noted to have an E Coli UTI. MRI imaging is pending this morning of her femur and abdomen. Cholelithasis was previously noted on imaging so this is being considered as a cause for her recurrent pancreatitis and general surgery is entertaining idea of cholecystectomy if other workup is negative. Therefore we are consulted for surgical risk.  From cardiac standpoint she reports complete stability without any chest pain or anginal concerns, DOE, palpitations, syncope or edema. She has not had to use any PRN Lasix recently. [place] and was previously very active but with her muscle wasting, weight loss and weakness she has had to scale back. She attributes a few falls to "losing all the meat on my legs."   Past Medical History:  Diagnosis Date  . AAA (abdominal aortic aneurysm) ([place])    a. 3.3cm infrarenal by CT 02/2020.  . Angio-edema   . Arthritis   . CAD (coronary artery disease)    a. s/p NSTEMI in 10/2017 with cath showing LM disease --> s/p CABG on 11/23/2017 with LIMA-LAD and SVG-OM.   [name] Carotid stenosis    a. Duplex 11/2018 4-96% RICA, 75-91% LICA.  [name] Hay fever   . Ischemic cardiomyopathy    a. EF 35-40% by echo in 10/2017. b. 55% by echo 01/2020.  . NSTEMI (non-ST elevated myocardial infarction) ([place])   .  PAF (paroxysmal atrial fibrillation) ([place])    Diagnosed by ILR  . Pancreatitis   . Pneumonia 11/16/2017  . Stroke [facility]) 08/23/2017   Left MCA infarct status post TPA and thrombectomy  . Urticaria     Past Surgical History:  Procedure Laterality Date  . BIOPSY  12/02/2019   Procedure: BIOPSY;  Surgeon:  Rush Landmark Telford Nab., MD;  Location: [facility];  Service: Gastroenterology;;  . CORONARY ARTERY BYPASS GRAFT N/A 11/23/2017   Procedure: CORONARY ARTERY BYPASS GRAFTING (CABG) x 2, ON PUMP, USING LEFT INTERNAL MAMMARY ARTERY AND RIGHT GREATER SAPHENOUS VEIN HARVESTED ENDOSCOPICALLY;  Surgeon: Gaye Pollack, MD;  Location: [place];  Service: Open Heart Surgery;  Laterality: N/A;  . CRYOABLATION     cervical  . ESOPHAGOGASTRODUODENOSCOPY (EGD) WITH PROPOFOL N/A 12/02/2019   Procedure: ESOPHAGOGASTRODUODENOSCOPY (EGD) WITH PROPOFOL;  Surgeon: Rush Landmark Telford Nab., MD;  Location: [place];  Service: Gastroenterology;  Laterality: N/A;  . FINE NEEDLE ASPIRATION  12/02/2019   Procedure: FINE NEEDLE ASPIRATION (FNA) LINEAR;  Surgeon: Irving Copas., MD;  Location: [place];  Service: Gastroenterology;;  . IR ANGIO INTRA EXTRACRAN SEL COM CAROTID INNOMINATE UNI R MOD SED  08/21/2017  . IR ANGIO VERTEBRAL SEL SUBCLAVIAN INNOMINATE UNI R MOD SED  08/21/2017  . IR PERCUTANEOUS ART THROMBECTOMY/INFUSION INTRACRANIAL INC DIAG ANGIO  08/21/2017  . IR RADIOLOGIST EVAL & MGMT  10/30/2017  . LOOP RECORDER INSERTION N/A 08/28/2017   Procedure: LOOP RECORDER INSERTION;  Surgeon: Constance Haw, MD;  Location: [facility];  Service: Cardiovascular;  Laterality: N/A;  . RADIOLOGY WITH ANESTHESIA N/A 08/21/2017   Procedure: RADIOLOGY WITH ANESTHESIA;  Surgeon: Luanne Bras, MD;  Location: [place];  Service: Radiology;  Laterality: N/A;  . RIGHT/LEFT HEART CATH AND CORONARY ANGIOGRAPHY N/A 11/20/2017   Procedure: RIGHT/LEFT HEART CATH AND CORONARY ANGIOGRAPHY;  Surgeon: Jettie Booze, MD;  Location: [place];  Service: Cardiovascular;  Laterality: N/A;  . TEE WITHOUT CARDIOVERSION N/A 08/28/2017   Procedure: TRANSESOPHAGEAL ECHOCARDIOGRAM (TEE);  Surgeon: Fay Records, MD;  Location: [place];  Service: Cardiovascular;  Laterality: N/A;  . TEE WITHOUT  CARDIOVERSION N/A 11/23/2017   Procedure: TRANSESOPHAGEAL ECHOCARDIOGRAM (TEE);  Surgeon: Gaye Pollack, MD;  Location: [place];  Service: Open Heart Surgery;  Laterality: N/A;  . TUBAL LIGATION    . UPPER ESOPHAGEAL ENDOSCOPIC ULTRASOUND (EUS) N/A 12/02/2019   Procedure: UPPER ESOPHAGEAL ENDOSCOPIC ULTRASOUND (EUS);  Surgeon: Irving Copas., MD;  Location: [place];  Service: Gastroenterology;  Laterality: N/A;     Home Medications:  Prior to Admission medications   Medication Sig Start Date End Date Taking? Authorizing Provider  acetaminophen (TYLENOL) 325 MG tablet Take 2 tablets (650 mg total) by mouth every 6 (six) hours as needed for mild pain or moderate pain (or Fever >/= 101). 09/13/19  Yes Rai, Ripudeep K, MD  azelastine (ASTELIN) 0.1 % nasal spray Place 1-2 sprays into both nostrils 2 (two) times daily as needed for rhinitis. Use in each nostril as directed 09/17/19  Yes Bobbitt, [place] Muta, MD  baclofen (LIORESAL) 10 MG tablet Take 10 mg by mouth in the morning and at bedtime.   Yes [provider]  calcium carbonate (TUMS - DOSED IN MG ELEMENTAL CALCIUM) 500 MG chewable tablet Chew 1 tablet by mouth 2 (two) times daily.    Yes [provider]  carvedilol (COREG) 3.125 MG tablet Take 1 tablet (3.125 mg total) by mouth in the morning and at  bedtime. 03/16/20  Yes Satira Sark, MD  cholecalciferol (VITAMIN D) 1000 units tablet Take 1,000 Units by mouth daily.   Yes [provider]  famotidine (PEPCID) 20 MG tablet Take 1 tablet (20 mg total) by mouth 2 (two) times daily. 12/06/19  Yes [name], [name], DO  ferrous sulfate 325 (65 FE) MG tablet Take 325 mg by mouth 2 (two) times a week. Mondays  and Thursdays.   Yes [provider]  hydrOXYzine (ATARAX/VISTARIL) 10 MG tablet Take 1 tablet (10 mg total) by mouth at bedtime. 12/06/19  Yes [name], [name], DO  levocetirizine (XYZAL) 5 MG tablet Take 1 tablet (5 mg total) by mouth every  evening. 12/06/19  Yes [name], [name], DO  omeprazole (PRILOSEC) 40 MG capsule Take 1 capsule (40 mg total) by mouth daily. 12/06/19  Yes [name], [name], DO  ondansetron (ZOFRAN ODT) 4 MG disintegrating tablet Take 1 tablet (4 mg total) by mouth every 8 (eight) hours as needed for nausea or vomiting. 12/04/19  Yes Rai, Ripudeep K, MD  pantoprazole (PROTONIX) 40 MG tablet Take 40 mg by mouth daily. 03/03/20  Yes [provider]  pravastatin (PRAVACHOL) 20 MG tablet Take 1 tablet (20 mg total) by mouth every evening. 02/18/20  Yes Strader, [place] M, PA-C  rivaroxaban (XARELTO) 20 MG TABS tablet Take 1 tablet daily with evening meal Patient taking differently: Take 20 mg by mouth daily with supper.  09/25/19  Yes Satira Sark, MD  tamsulosin (FLOMAX) 0.4 MG CAPS capsule Take 1 capsule (0.4 mg total) by mouth daily. 03/03/20  Yes [name], [name], DO  traMADol (ULTRAM) 50 MG tablet TAKE 1 TO 2 TABLETS BY MOUTH EVERY 8 HOURS AS NEEDED FOR MODERATE PAIN Patient taking differently: Take 50-100 mg by mouth every 8 (eight) hours as needed for moderate pain.  03/09/20  Yes [name], [name], DO  vitamin B-12 (CYANOCOBALAMIN) 100 MCG tablet Take 100 mcg by mouth daily.   Yes [provider]    Inpatient Medications: Scheduled Meds: . baclofen  10 mg Oral BID  . calcium carbonate  1 tablet Oral BID  . carvedilol  3.125 mg Oral BID WC  . cetirizine  10 mg Oral Daily  . hydrOXYzine  10 mg Oral QHS  . pantoprazole  40 mg Oral Daily  . tamsulosin  0.4 mg Oral Daily  . ursodiol  300 mg Oral BID   Continuous Infusions: . lactated ringers 100 mL/hr at 03/20/20 0628   PRN Meds: bisacodyl, hydrALAZINE, morphine injection, ondansetron, senna-docusate  Allergies:    Allergies  Allergen Reactions  . Dilaudid [Hydromorphone] Other (See Comments)    Hallucinations  . Entresto [Sacubitril-Valsartan] Swelling and Rash    Welts, possible angioedema   . Lopressor [Metoprolol Tartrate]  Swelling    Angioedema  . Allegra Allergy [Fexofenadine Hcl] Hives  . Nsaids     On Xarelto  . Gabapentin (Once-Daily) Rash  . Lipitor [Atorvastatin] Diarrhea    Social History:   Social History   Socioeconomic History  . Marital status: Married    Spouse name: Not on file  . Number of children: 2  . Years of education: Not on file  . Highest education level: Not on file  Occupational History  . Not on file  Tobacco Use  . Smoking status: Current Some Day Smoker    Packs/day: 0.10    Years: 37.00    Pack years: 3.70    Types: Cigarettes  . Smokeless  tobacco: Never Used  Vaping Use  . Vaping Use: Never used  Substance and Sexual Activity  . Alcohol use: No  . Drug use: No  . Sexual activity: Yes    Partners: Male  Other Topics Concern  . Not on file  Social History Narrative   Married. 2 children.    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    Walks with cane or wheelchair.    Former smoker.    Drinks caffeine.    Smoke alarm in the home.   Firearms in the home.    Wears her seatbelt.    Feels safe In her relationships.    Social Determinants of Health   Financial Resource Strain:   . Difficulty of Paying Living Expenses:   Food Insecurity:   . Worried About Charity fundraiser in the Last Year:   . Arboriculturist in the Last Year:   Transportation Needs:   . Film/video editor (Medical):   [name] Lack of Transportation (Non-Medical):   Physical Activity:   . Days of Exercise per Week:   . Minutes of Exercise per Session:   Stress:   . Feeling of Stress :   Social Connections:   . Frequency of Communication with Friends and Family:   . Frequency of Social Gatherings with Friends and Family:   . Attends Religious Services:   . Active Member of Clubs or Organizations:   . Attends Archivist Meetings:   [name] Marital Status:   Intimate Partner Violence:   . Fear of Current or Ex-Partner:   . Emotionally Abused:   [name] Physically Abused:   . Sexually  Abused:     Family History:    Family History  Problem Relation Age of Onset  . Heart attack Father   . Hypertension Mother   . Hyperlipidemia Mother   . Diabetes type II Mother   . Urticaria Mother   . Hypertension Brother   . Breast cancer Cousin 76  . Immunodeficiency Neg Hx   . Eczema Neg Hx   . Atopy Neg Hx   . Asthma Neg Hx   . Angioedema Neg Hx   . Allergic rhinitis Neg Hx   . Colon cancer Neg Hx   . Esophageal cancer Neg Hx   . Stomach cancer Neg Hx   . Pancreatic cancer Neg Hx   . Colon polyps Neg Hx   . Esophageal varices Neg Hx   . Inflammatory bowel disease Neg Hx   . Rectal cancer Neg Hx      ROS:  Please see the history of present illness.  All other ROS reviewed and negative.     Physical Exam/Data:   Vitals:   03/19/20 1337 03/19/20 1425 03/19/20 2230 03/20/20 0607  BP: 116/70 118/73 118/78 106/76  Pulse: 91 95 92 87  Resp: 18 16 17 16   Temp: 98.1 F (36.7 C) 98.1 F (36.7 C) 98 F (36.7 C) (!) 97.5 F (36.4 C)  TempSrc: Oral Oral Oral Oral  SpO2: 93% 92% 94% 96%  Weight:    52.1 kg  Height:        Intake/Output Summary (Last 24 hours) at 03/20/2020 0825 Last data filed at 03/20/2020 0600 Gross per 24 hour  Intake 1181.7 ml  Output --  Net 1181.7 ml   Last 3 Weights 03/20/2020 03/19/2020 03/17/2020  Weight (lbs) 114 lb 13.8 oz 116 lb 116 lb  Weight (kg) 52.1 kg 52.617 kg 52.617 kg  Body mass index is 22.43 kg/m.  General: Well developed, well nourished WF, in no acute distress. Head: Normocephalic, atraumatic, sclera non-icteric, no xanthomas, nares are without discharge. Neck: Negative for carotid bruits. JVP not elevated. Lungs: Clear bilaterally to auscultation without wheezes, rales, or rhonchi. Breathing is unlabored. Heart: RRR S1 S2 without murmurs, rubs, or gallops.  Abdomen: Soft, non-tender, non-distended with normoactive bowel sounds. No rebound/guarding. Extremities: No clubbing or cyanosis. No edema. Distal pedal pulses  are 2+ and equal bilaterally. Generalized muscular atrophy noted. Neuro: Alert and oriented X 3. Moves all extremities spontaneously. Psych:  Responds to questions appropriately with a normal affect.   EKG:  The EKG was personally reviewed and demonstrates:  NSR 94bpm, occasional PVCs including one couplet, probable LAE, NSVICD with QRS 183ms, nonspecific STT changes, nonacute - no significant change compared to prior.  Telemetry:  Telemetry was personally reviewed and demonstrates: N/a  Relevant CV Studies: 2D Echo 01/2020 1. Left ventricular ejection fraction, by estimation, is 55%. The left  ventricle has normal function. The left ventricle demonstrates regional  wall motion abnormalities (see scoring diagram/findings for description).  There is mild left ventricular  hypertrophy. Left ventricular diastolic parameters are consistent with  Grade I diastolic dysfunction (impaired relaxation).  2. Right ventricular systolic function is normal. The right ventricular  size is normal. Tricuspid regurgitation signal is inadequate for assessing  PA pressure.  3. Left atrial size was upper normal.  4. The mitral valve is grossly normal. Mild mitral valve regurgitation.  5. The aortic valve is tricuspid. Aortic valve regurgitation is not  visualized. Mild aortic valve sclerosis is present, with no evidence of  aortic valve stenosis.  6. The inferior vena cava is normal in size with greater than 50%  respiratory variability, suggesting right atrial pressure of 3 mmHg.   Laboratory Data:  High Sensitivity Troponin:  No results for input(s): TROPONINIHS in the last 720 hours.   Chemistry Recent Labs  Lab 03/18/20 0548 03/19/20 0504 03/20/20 0442  NA 132* 139 140  K 3.7 3.4* 3.7  CL 104 107 107  CO2 21* 25 27  GLUCOSE 83 76 83  BUN 10 10 7   CREATININE 0.47 0.46 0.55  CALCIUM 8.5* 8.3* 8.3*  GFRNONAA >60 >60 >60  GFRAA >60 >60 >60  ANIONGAP 7 7 6     Recent Labs  Lab  03/17/20 0231 03/18/20 0548 03/20/20 0442  PROT 7.2 6.2* 4.9*  ALBUMIN 4.2 3.2* 2.8*  AST 19 20 15   ALT 15 13 11   ALKPHOS 70 62 42  BILITOT 0.4 0.5 0.5   Hematology Recent Labs  Lab 03/18/20 0548 03/19/20 0504 03/20/20 0442  WBC 15.7* 8.1 7.5  RBC 5.04 4.02 3.92  HGB 15.9* 12.5 12.2  HCT 48.1* 38.5 38.5  MCV 95.4 95.8 98.2  MCH 31.5 31.1 31.1  MCHC 33.1 32.5 31.7  RDW 13.0 13.0 13.2  PLT 362 300 279   BNPNo results for input(s): BNP, PROBNP in the last 168 hours.  DDimer No results for input(s): DDIMER in the last 168 hours.   Radiology/Studies:  CT ABDOMEN PELVIS W CONTRAST  Result Date: 03/17/2020 CLINICAL DATA:  Acute generalized abdominal pain. EXAM: CT ABDOMEN AND PELVIS WITH CONTRAST TECHNIQUE: Multidetector CT imaging of the abdomen and pelvis was performed using the standard protocol following bolus administration of intravenous contrast. CONTRAST:  160mL OMNIPAQUE IOHEXOL 300 MG/ML  SOLN COMPARISON:  March 02, 2020.  Jan 31, 2020.  March 5th 2021. FINDINGS: Lower chest: No acute  abnormality. Hepatobiliary: No focal liver abnormality is seen. No gallstones, gallbladder wall thickening, or biliary dilatation. Pancreas: No ductal dilatation is noted. However, rounded low density is noted in the pancreatic head which appears to be enlarged compared to prior exam, currently measuring 31 x 26 mm. 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 Clinical and laboratory correlation is recommended. Spleen: Normal in size without focal abnormality. Adrenals/Urinary Tract: Adrenal glands appear normal. Small left renal cysts are noted. No hydronephrosis or renal obstruction is noted. No renal or ureteral calculi are noted. Urinary bladder is unremarkable. Stomach/Bowel: Stomach is within normal limits. Appendix appears normal. No evidence of bowel wall thickening, distention, or inflammatory changes. Vascular/Lymphatic:  Grossly stable 3.3 cm infrarenal abdominal aortic aneurysm is noted. No adenopathy is noted. Reproductive: Uterus and bilateral adnexa are unremarkable. Other: No abdominal wall hernia or abnormality. No abdominopelvic ascites. Musculoskeletal: There appears to be lytic destruction involving the greater trochanter of the proximal right femur with associated pathologic fracture. This is concerning for possible malignancy or metastatic disease. IMPRESSION: 1. There appears to be lytic destruction involving the greater trochanter of the proximal right femur with associated pathologic fracture. This is concerning for possible malignancy or metastatic disease. MRI or bone scan is recommended for further evaluation. 2. Rounded low density is noted in the pancreatic head which appears to be enlarged compared to prior exam, currently measuring 31 x 26 mm. 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 Clinical and laboratory correlation is recommended, and MRI also should be considered to rule out potential neoplasm or malignancy. 3. Grossly stable 3.3 cm infrarenal abdominal aortic aneurysm. Recommend followup by ultrasound in 3 years. This recommendation follows ACR consensus guidelines: White Paper of the ACR Incidental Findings Committee II on Vascular Findings. J Am Coll Radiol 2013; 10:789-794. Aortic Atherosclerosis (ICD10-I70.0). Electronically Signed   By: Marijo Conception M.D.   On: 03/17/2020 11:33   MR FEMUR RIGHT W WO CONTRAST  Result Date: 03/20/2020 CLINICAL DATA:  Evaluate greater trochanteric mass EXAM: MRI OF THE RIGHT FEMUR WITHOUT AND WITH CONTRAST TECHNIQUE: Multiplanar, multisequence MR imaging of the right hip was performed both before and after administration of intravenous contrast. CONTRAST:  67mL GADAVIST GADOBUTROL 1 MMOL/ML IV SOLN COMPARISON:  CT 03/17/2020 FINDINGS: Bones/Joint/Cartilage T1 hypointense, T2 heterogeneously  hyperintense marrow replacing lesion centered at the greater trochanter of the proximal right femur measuring approximately 4.0 x 2.5 x 2.5 cm (series 40, image 11; series 5, image 11). Mass demonstrates enhancement on postcontrast imaging with central area of non enhancement which may reflect necrosis. Comminuted pathologic fracture of the more superior aspect of the greater trochanter, better visualized on recent CT. There is edema and enhancement within the posterior and posteromedial soft tissues adjacent to the lesion which may reflect tumor extension and/or reactive changes secondary to the adjacent fracture (series 39, image 13). Right femoral head is intact without dislocation. No femoral head avascular necrosis. No additional marrow replacing lesions. No hip joint effusion. Ligaments Intact. Muscles and Tendons No acute myotendinous abnormality. Soft tissues No right inguinal lymphadenopathy. No soft tissue fluid collections. IMPRESSION: Enhancing marrow replacing lesion within the greater trochanter of the proximal right femur measuring up to 4.0 cm with associated pathologic fracture. Edema and enhancement within the posterior and posteromedial soft tissues adjacent to the lesion may reflect tumor extension and/or reactive changes secondary to the  adjacent fracture. Imaging features are nonspecific with primary differential considerations including metastatic disease and myeloma. Consider nuclear medicine bone scan to evaluate for the presence of any additional lesions. Electronically Signed   By: Davina Poke D.O.   On: 03/20/2020 08:21   MR 3D Recon At Scanner  Result Date: 03/20/2020 CLINICAL DATA:  Pancreatic pseudocyst. EXAM: MRI ABDOMEN WITHOUT AND WITH CONTRAST (INCLUDING MRCP) TECHNIQUE: Multiplanar multisequence MR imaging of the abdomen was performed both before and after the administration of intravenous contrast. Heavily T2-weighted images of the biliary and pancreatic ducts were  obtained, and three-dimensional MRCP images were rendered by post processing. CONTRAST:  39mL GADAVIST GADOBUTROL 1 MMOL/ML IV SOLN COMPARISON:  CT SCAN 03/17/2020.  MRI 11/07/2019. FINDINGS: Lower chest: UNREMARKABLE. Hepatobiliary: 8 mm T2 hyperintensity identified in the lateral segment left liver is stable since previous MRI. Postcontrast imaging suggests cavernous hemangioma as etiology. There is no evidence for gallstones, gallbladder wall thickening, or pericholecystic fluid. No intrahepatic or extrahepatic biliary dilation. Pancreas: As noted on recent CT scan, there is a 2.5 x 2.4 cm complex fluid collection in the head of pancreas subtle rim enhancement without evidence for internal architecture/enhancement. Lesion shows no appreciable diffusion restriction. No associated dilatation of the main pancreatic duct. Pancreatic tissue enhances throughout. There is some subtle edema in the peripancreatic fat around the head of pancreas. Spleen:  No splenomegaly. No focal mass lesion. Adrenals/Urinary Tract: Bilateral thickening in the adrenal glands is compatible with hyperplasia. Tiny cortical cysts noted in both kidneys no suspicious enhancing renal lesion. No hydronephrosis. Stomach/Bowel: Stomach is unremarkable. No gastric wall thickening. No evidence of outlet obstruction. Duodenum is normally positioned as is the ligament of Treitz. No small bowel or colonic dilatation within the visualized abdomen. Vascular/Lymphatic: Infrarenal abdominal aortic aneurysm measures up to 3.2 cm diameter. There is no gastrohepatic or hepatoduodenal ligament lymphadenopathy. No retroperitoneal or mesenteric lymphadenopathy. Other:  No intraperitoneal free fluid. Musculoskeletal: No suspicious marrow enhancement within the visualized bony anatomy. IMPRESSION: 1. No substantial change in the complex cystic pancreatic head lesion noted on CT scan 2 days ago. This does not appear to be a duodenal diverticulum. Rapid interval  appearance again suggests pseudocyst in this patient with a history of pancreatitis. No mural nodularity or central enhancement. Follow-up MRI in 3 months recommended to ensure resolution. 2. Trace edema noted around the head of pancreas. 3. 3.2 cm abdominal aortic aneurysm. Recommend followup by ultrasound in 3 years. This recommendation follows ACR consensus guidelines: White Paper of the ACR Incidental Findings Committee II on Vascular Findings. Natasha Mead Coll Radiol 2013; 10:789-794 Electronically Signed   By: Misty Stanley M.D.   On: 03/20/2020 08:04   DG Chest Port 1 View  Result Date: 03/17/2020 CLINICAL DATA:  Sepsis. EXAM: PORTABLE CHEST 1 VIEW COMPARISON:  None. FINDINGS: The heart size and mediastinal contours are within normal limits. Sternotomy wires are noted. No pneumothorax or pleural effusion is noted. Both lungs are clear. The visualized skeletal structures are unremarkable. IMPRESSION: No active disease. Electronically Signed   By: Marijo Conception M.D.   On: 03/17/2020 08:28   MR ABDOMEN MRCP W WO CONTAST  Result Date: 03/20/2020 CLINICAL DATA:  Pancreatic pseudocyst. EXAM: MRI ABDOMEN WITHOUT AND WITH CONTRAST (INCLUDING MRCP) TECHNIQUE: Multiplanar multisequence MR imaging of the abdomen was performed both before and after the administration of intravenous contrast. Heavily T2-weighted images of the biliary and pancreatic ducts were obtained, and three-dimensional MRCP images were rendered by post processing. CONTRAST:  53mL  GADAVIST GADOBUTROL 1 MMOL/ML IV SOLN COMPARISON:  CT SCAN 03/17/2020.  MRI 11/07/2019. FINDINGS: Lower chest: UNREMARKABLE. Hepatobiliary: 8 mm T2 hyperintensity identified in the lateral segment left liver is stable since previous MRI. Postcontrast imaging suggests cavernous hemangioma as etiology. There is no evidence for gallstones, gallbladder wall thickening, or pericholecystic fluid. No intrahepatic or extrahepatic biliary dilation. Pancreas: As noted on recent CT  scan, there is a 2.5 x 2.4 cm complex fluid collection in the head of pancreas subtle rim enhancement without evidence for internal architecture/enhancement. Lesion shows no appreciable diffusion restriction. No associated dilatation of the main pancreatic duct. Pancreatic tissue enhances throughout. There is some subtle edema in the peripancreatic fat around the head of pancreas. Spleen:  No splenomegaly. No focal mass lesion. Adrenals/Urinary Tract: Bilateral thickening in the adrenal glands is compatible with hyperplasia. Tiny cortical cysts noted in both kidneys no suspicious enhancing renal lesion. No hydronephrosis. Stomach/Bowel: Stomach is unremarkable. No gastric wall thickening. No evidence of outlet obstruction. Duodenum is normally positioned as is the ligament of Treitz. No small bowel or colonic dilatation within the visualized abdomen. Vascular/Lymphatic: Infrarenal abdominal aortic aneurysm measures up to 3.2 cm diameter. There is no gastrohepatic or hepatoduodenal ligament lymphadenopathy. No retroperitoneal or mesenteric lymphadenopathy. Other:  No intraperitoneal free fluid. Musculoskeletal: No suspicious marrow enhancement within the visualized bony anatomy. IMPRESSION: 1. No substantial change in the complex cystic pancreatic head lesion noted on CT scan 2 days ago. This does not appear to be a duodenal diverticulum. Rapid interval appearance again suggests pseudocyst in this patient with a history of pancreatitis. No mural nodularity or central enhancement. Follow-up MRI in 3 months recommended to ensure resolution. 2. Trace edema noted around the head of pancreas. 3. 3.2 cm abdominal aortic aneurysm. Recommend followup by ultrasound in 3 years. This recommendation follows ACR consensus guidelines: White Paper of the ACR Incidental Findings Committee II on Vascular Findings. [facility] Radiol 2013; 10:789-794 Electronically Signed   By: Misty Stanley M.D.   On: 03/20/2020 08:04    {   Assessment and Plan:   1. Pre-op cardiovascular evaluation - consulted for possible lap chole. Revised cardiac risk index is >11% based on prior medical history. However, by recent echocardiogram the patient's LV function has actually normalized and she has no concerning cardiac symptoms. She is able to walk slowly without precipitating any cardiac symptoms. 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 Her generalized muscle wasting/weakness is what keeps her from being more active recently. I personally do not have any specific concerns about her undergoing surgery but will discuss final recs with MD.   2. CAD - not on ASA at home due to concomitant Xarelto. Continue BB as tolerated. Statin on hold at this time by primary team. Was only on pravastatin as outpatient with suboptimal control in 2020. Optimized cholesterol regimen can be revisited as outpatient once acute issues settle down.  3. History of ischemic cardiomyopathy - recent echocardiogram demonstrated normalization of LV function. Appears euvolemic on exam.  4. Paroxysmal atrial fibrillation - CHADSVASC is 5, therefore she is maintained on long term Xarelto. Appears this has been held by primary team. Given history of stroke would suggest to minimize time off anticoagulation - fortunately she is maintaining NSR. Can consider IV heparin if anticipated to be prolonged period of time.   5. PVCs on admit EKG - suspect occurring in the context of hypokalemia. Exam reveals RRR. Goal K 4.0 or greater so will give another 37meq  KCl this AM. Admit value likely 2/2 n/v. Mg OK. Continue BB otherwise.  6. Carotid artery disease - study in 11/2018 showed 5-43% RICA, 01-48% LICA, would be due for updated study. Consider updating contingent on above workup.  For questions or updates, please contact [place] Please consult [email or web address] for contact info under    Signed, Charlie Pitter, PA-C  03/20/2020 8:25 AM

## 2020-03-20 NOTE — Progress Notes (Signed)
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  Will await FNA results from EUS yesterday by GI. MRI of the abdomen and hip pending. We will follow peripherally for results. We have asked cardiology to see for risk stratification. Can continue Actigal in interim which may address the biliary sludge/small gallstones which she has been shown to have. Her biliary sludge/small gallstones could be the cause of pancreatitis but would entertain cholecystectomy only after further workup, resolution of abdominal pain and ~normalization of lipase (down to 189 on today's labs from 6,271 on admission).

## 2020-03-20 NOTE — Consult Note (Signed)
A consult was requested on this patient due to the fact that she has a lesion in her right hip around the greater trochanter.  The consultation was for hopefully orthopedics performing a biopsy of this area to get a better idea of what the lesion is because there is some uncertainty as to what is the primary source of her disease.  I have reviewed the patient's MRI and her CT scan.  A biopsy such as this needs to be done by radiology under either fluoroscopy or likely CT guidance to get to this area.  I would not be comfortable performing a biopsy of this in the operative environment.  Where she has a small pathologic fracture at the area of the greater trochanter is not something that warrants a surgical intervention such as if she had a pathologic fracture that required [place] to place some type of intramedullary nail to stabilize the femur.  In that situation we would then have the femur exposed and send soft tissue to pathology.  However this is not a situation where the patient needs surgery to stabilize her femur.  I would recommend the biopsy be performed by someone such as interventional radiology who could do this under CT guidance in order to get to the lesion with minimal disruption of the soft tissues.  This is not something that I am comfortable doing in the operating room given the fact that this would involve more of an incision and opening at the soft tissue planes which could certainly lead to spread of the disease or could compromise a surgical intervention if needed to eradicate the lesion in its entirety.  This is usually done by an orthopedic oncologist.  Again, my recommendation would be for a consultation to radiology for this type of biopsy.

## 2020-03-20 NOTE — Progress Notes (Addendum)
PROGRESS NOTE    [name]    Code Status: Full Code  JOI:[id number] DOB: 09/15/1961 DOA: 03/17/2020 LOS: 3 days  PCP: [name], DO CC:  Chief Complaint  Patient presents with  . Abdominal Pain       Hospital Summary   This is a 59 year old female with history of recurrent idiopathic pancreatitis, paroxysmal A. fib, CAD s/p CABG, hypertension, anxiety, recent CVA, recent fall in March, AAA who presented to the ED on 6/22 with persistent epigastric abdominal pain with associated nausea and vomiting.  Had been taking Flomax in order to pass a kidney stone recently and was having some hematuria but this had resolved.  ED Course: Afebrile, mildly tachycardic, BP 136/96.  CBC notable for leukocytosis 16.8, hemoconcentration with hemoglobin 15.4.  BMP was notable for potassium of 3.0 and glucose 138.  Lipase elevated 6271.  Urinalysis was consistent with infection and reflex to culture.  Chest x-ray negative. GI was consulted for recurrent pancreatitis.    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  6/24:  EGD/EUS with Dr. Ardis Hughs.  Abnormal head of pancreas without discrete solid or anechoic cystic lesions.  FNA performed and preliminary review showing no neoplastic cells.  CBD normal and nondilated.  GI recommended interval imaging of the pancreas with MRI in 2 to 3 months as well as general surgery consult to consider cholecystectomy.  6/25:  Cardiology consulted for cardiac clearance. MRI right femur: Enhancing marrow replacing lesion within the greater trochanter of the proximal right femur measuring up to 4.0 cm with associated pathologic fracture.  Edema and enhancement within the posterior and posteromedial soft tissues adjacent to the lesion may reflect tumor extension and/or reactive changes secondary to the adjacent fracture.  Orthopedic surgery consulted    A & P   Principal Problem:   Acute on chronic pancreatitis (HCC) Active Problems:   Cerebrovascular accident (CVA) due to occlusion of left middle cerebral artery (HCC)   Hyperlipidemia LDL goal <70   Hypokalemia   CAD in native artery   Benign essential HTN   Chronic diastolic CHF (congestive heart failure) (HCC)   Paroxysmal atrial fibrillation (HCC)   Anxiety   Recurrent pancreatitis   Preoperative evaluation of a medical condition to rule out surgical contraindications (TAR required)   Pure hypercholesterolemia   1. Recurrent acute on chronic idiopathic pancreatitis with abnormal finding of pancreatic head s/p EGD/EUS with Dr. Ardis Hughs on 6/24  a. S/p EUS with FNA in March 2021 with atypical cells thought to be inflammatory at the time.  Sludge noted in gallbladder.  CT scan in May showed improved inflammatory changes and decreased size of suspected pseudocyst b. Reactive leukocytosis c. EGD/EUS with Dr. Ardis Hughs 6/24:.  Abnormal head of pancreas without discrete solid or anechoic cystic lesions.  FNA performed and preliminary review showing no neoplastic cells.  CBD normal and nondilated.  GI recommended interval imaging of the pancreas with MRI in 2 to 3 months as well as general surgery consult to consider cholecystectomy. d. MRI abdomen/MRCP-complex cystic pancreatic head lesion similar to CT scan 2 days ago, suggestive of pseudocyst, follow-up MRI in 3 months recommended.  2. Biliary sludge  Cholelithiasis a. General  surgery consulted to evaluate for cholecystectomy -awaiting FNA results from EUS.  Cardiology consulted for risk stratification.  Continue Actigall to address biliary sludge/small gallstones.  Sludge/gallstones could possibly be the cause of pancreatitis  but would entertain cholecystectomy after further work-up and resolution of abdominal pain/normalization of lipase (currently 189)  3. 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 Incidentally found on CT abdomen pelvis on presentation b. No known history of cancer but has had significant weight loss in the past few months c. MRI with and without contrast: Enhancing marrow replacing lesion within the greater trochanter of the proximal right femur measuring up to 4.0 cm with associated pathologic fracture. Edema and enhancement within the posterior and posteromedial soft tissues adjacent to the lesion may reflect tumor extension and/or reactive changes secondary to the adjacent fracture.  d. Will order nuclear med bone scan to evaluate for the presence of any additional lesions e. Orthopedic surgery consulted to evaluate for bone biopsy  4. Reactive leukocytosis, resolved  5. Tick removed from posterior neck a. Unknown duration b. 6/23 incidental finding on the posterior neck by nursing staff.  Removed.  Unfortunately no picture was obtained c. No sign of tickborne illness, will hold off on empiric treatment  6. Asymptomatic E. coli UTI a. Hold antibiotics for now.  Will start if she becomes febrile or has symptoms  7. History of nephrolithiasis, resolved a. On Flomax  8. Hypotension a. Resolved  9. History of gastritis/duodenitis a. On PPI  10. Hyperlipidemia   11. Hypertension a. Continue low-dose Coreg for now  12. Chronic diastolic heart failure, not in acute exacerbation a. Will monitor for volume overload as she is getting high volumes of fluids pancreatitis b. Daily weights and I/O  13. Paroxysmal A. fib on Xarelto and Coreg a. Hold Xarelto as above -pending GI recommendations  14. Anxiety stable on home Vistaril  15. Stable infrarenal AAA a. Repeat ultrasound in 3 years for  surveillance  DVT prophylaxis: Place and maintain sequential compression device Start: 03/18/20 1526 Place and maintain sequential compression device Start: 03/18/20 1526   Family Communication: Husband at bedside  Disposition Plan:  Status is: Inpatient  Remains inpatient appropriate because:IV treatments appropriate due to intensity of illness or inability to take PO and Inpatient level of care appropriate due to severity of illness   Dispo: The patient is from: Home              Anticipated d/c is to: Home              Anticipated d/c date is: 2 days              Patient currently is not medically stable to d/c.          Pressure injury documentation    None  Consultants  GI  General surgery Cardiology Orthopedic surgery  Procedures  EUS/ERCP 6/24  Antibiotics   Anti-infectives (From admission, onward)   Start     Dose/Rate Route Frequency Ordered Stop   03/17/20 1100  cefTRIAXone (ROCEPHIN) 1 g in sodium chloride 0.9 % 100 mL IVPB        1 g 200 mL/hr over 30 Minutes Intravenous  Once 03/17/20 1046 03/17/20 1551       Pretension Subjective   Patient seen and examined at bedside no acute distress and resting comfortably.  No events overnight.  Tolerating diet.  Denies any leg pain and states she is  able to tolerate weightbearing  Denies any chest pain, shortness of breath, fever, nausea, vomiting, urinary complaints.  Admits to having bowel movement.  Otherwise ROS negative  Objective   Vitals:   03/19/20 1337 03/19/20 1425 03/19/20 2230 03/20/20 0607  BP: 116/70 118/73 118/78 106/76  Pulse: 91 95 92 87  Resp: 18 16 17 16   Temp: 98.1 F (36.7 C) 98.1 F (36.7 C) 98 F (36.7 C) (!) 97.5 F (36.4 C)  TempSrc: Oral Oral Oral Oral  SpO2: 93% 92% 94% 96%  Weight:    52.1 kg  Height:        Intake/Output Summary (Last 24 hours) at 03/20/2020 1236 Last data filed at 03/20/2020 1008 Gross per 24 hour  Intake 1421.7 ml  Output --  Net 1421.7 ml    Filed Weights   03/17/20 0658 03/19/20 1032 03/20/20 0607  Weight: 52.6 kg 52.6 kg 52.1 kg    Examination:  Physical Exam Vitals and nursing note reviewed.  Constitutional:      Appearance: Normal appearance.  HENT:     Head: Normocephalic and atraumatic.  Eyes:     Conjunctiva/sclera: Conjunctivae normal.  Cardiovascular:     Rate and Rhythm: Normal rate and regular rhythm.  Pulmonary:     Effort: Pulmonary effort is normal.     Breath sounds: Normal breath sounds.  Abdominal:     General: Abdomen is flat.     Palpations: Abdomen is soft.  Musculoskeletal:        General: No swelling or tenderness.       Legs:  Skin:    Coloration: Skin is not jaundiced or pale.  Neurological:     Mental Status: She is alert. Mental status is at baseline.  Psychiatric:        Mood and Affect: Mood normal.        Behavior: Behavior normal.     Data Reviewed: I have personally reviewed following labs and imaging studies  CBC: Recent Labs  Lab 03/17/20 0231 03/18/20 0548 03/19/20 0504 03/20/20 0442  WBC 16.8* 15.7* 8.1 7.5  HGB 15.4* 15.9* 12.5 12.2  HCT 47.5* 48.1* 38.5 38.5  MCV 98.8 95.4 95.8 98.2  PLT 377 362 300 962   Basic Metabolic Panel: Recent Labs  Lab 03/17/20 0231 03/17/20 0817 03/18/20 0548 03/19/20 0504 03/20/20 0442  NA 138  --  132* 139 140  K 3.0*  --  3.7 3.4* 3.7  CL 103  --  104 107 107  CO2 27  --  21* 25 27  GLUCOSE 138*  --  83 76 83  BUN 11  --  10 10 7   CREATININE 0.58  --  0.47 0.46 0.55  CALCIUM 8.8*  --  8.5* 8.3* 8.3*  MG  --  2.2 2.0  --   --    GFR: Estimated Creatinine Clearance: 54.4 mL/min (by C-G formula based on SCr of 0.55 mg/dL). Liver Function Tests: Recent Labs  Lab 03/17/20 0231 03/18/20 0548 03/20/20 0442  AST 19 20 15   ALT 15 13 11   ALKPHOS 70 62 42  BILITOT 0.4 0.5 0.5  PROT 7.2 6.2* 4.9*  ALBUMIN 4.2 3.2* 2.8*   Recent Labs  Lab 03/17/20 0231 03/20/20 0442  LIPASE 6,271* 189*   No results for  input(s): AMMONIA in the last 168 hours. Coagulation Profile: Recent Labs  Lab 03/17/20 0817  INR 1.2   Cardiac Enzymes: No results for input(s): CKTOTAL, CKMB, CKMBINDEX, TROPONINI in the last 168  hours. BNP (last 3 results) No results for input(s): PROBNP in the last 8760 hours. HbA1C: No results for input(s): HGBA1C in the last 72 hours. CBG: No results for input(s): GLUCAP in the last 168 hours. Lipid Profile: No results for input(s): CHOL, HDL, LDLCALC, TRIG, CHOLHDL, LDLDIRECT in the last 72 hours. Thyroid Function Tests: No results for input(s): TSH, T4TOTAL, FREET4, T3FREE, THYROIDAB in the last 72 hours. Anemia Panel: No results for input(s): VITAMINB12, FOLATE, FERRITIN, TIBC, IRON, RETICCTPCT in the last 72 hours. Sepsis Labs: Recent Labs  Lab 03/17/20 0817  LATICACIDVEN 1.1    Recent Results (from the past 240 hour(s))  Blood Culture (routine x 2)     Status: None (Preliminary result)   Collection Time: 03/17/20  8:17 AM   Specimen: BLOOD LEFT HAND  Result Value Ref Range Status   Specimen Description   Final    BLOOD LEFT HAND Performed at [facility], [place] [address], [place], [place] [address]    Special Requests   Final    BOTTLES DRAWN AEROBIC AND ANAEROBIC Blood Culture results may not be optimal due to an inadequate volume of blood received in culture bottles Performed at [facility] [address] [place], [place] [address]    Culture   Final    NO GROWTH 2 DAYS Performed at [facility] [address] [place], [place] [address]    Report Status PENDING  Incomplete  Blood Culture (routine x 2)     Status: None (Preliminary result)   Collection Time: 03/17/20  9:14 AM   Specimen: BLOOD  Result Value Ref Range Status   Specimen Description   Final    BLOOD RIGHT ANTECUBITAL Performed at [facility] [address] [place], [place] [address]    Special Requests   Final     BOTTLES DRAWN AEROBIC AND ANAEROBIC Blood Culture results may not be optimal due to an excessive volume of blood received in culture bottles Performed at [facility] [address] [place], [place] [address]    Culture   Final    NO GROWTH 2 DAYS Performed at [facility] [address], [place], [place] [address]    Report Status PENDING  Incomplete  Urine culture     Status: Abnormal   Collection Time: 03/17/20  9:48 AM   Specimen: Urine, Clean Catch  Result Value Ref Range Status   Specimen Description   Final    URINE, CLEAN CATCH Performed at [facility], [place] [address], [place], [place] [address]    Special Requests   Final    NONE Performed at [facility], [place] [address], [place],  [address]    Culture >=100,000 COLONIES/mL ESCHERICHIA COLI (A)  Final   Report Status 03/19/2020 FINAL  Final   Organism ID, Bacteria ESCHERICHIA COLI (A)  Final      Susceptibility   Escherichia coli - MIC*    AMPICILLIN <=2 SENSITIVE Sensitive     CEFAZOLIN <=4 SENSITIVE Sensitive     CEFTRIAXONE <=0.25 SENSITIVE Sensitive     CIPROFLOXACIN <=0.25 SENSITIVE Sensitive     GENTAMICIN <=1 SENSITIVE Sensitive     IMIPENEM <=0.25 SENSITIVE Sensitive     NITROFURANTOIN <=16 SENSITIVE Sensitive     TRIMETH/SULFA <=20 SENSITIVE Sensitive     AMPICILLIN/SULBACTAM <=2 SENSITIVE Sensitive     PIP/TAZO <=4 SENSITIVE Sensitive     * >=100,000 COLONIES/mL ESCHERICHIA  COLI  SARS Coronavirus 2 by RT PCR (hospital order, performed in [facility] hospital lab) Nasopharyngeal Nasopharyngeal Swab     Status: None   Collection Time: 03/17/20  9:58 PM   Specimen: Nasopharyngeal Swab  Result Value Ref Range Status   SARS Coronavirus 2 NEGATIVE NEGATIVE Final    Comment: (NOTE) SARS-CoV-2 target nucleic acids are NOT DETECTED.  The SARS-CoV-2 RNA is generally detectable in upper and lower respiratory specimens during the acute phase  of infection. The lowest concentration of SARS-CoV-2 viral copies this assay can detect is 250 copies / mL. A negative result does not preclude SARS-CoV-2 infection and should not be used as the sole basis for treatment or other patient management decisions.  A negative result may occur with improper specimen collection / handling, submission of specimen other than nasopharyngeal swab, presence of viral mutation(s) within the areas targeted by this assay, and inadequate number of viral copies (<250 copies / mL). A negative result must be combined with clinical observations, patient history, and epidemiological information.  Fact Sheet for Patients:   StrictlyIdeas.no  Fact Sheet for Healthcare Providers: BankingDealers.co.za  This test is not yet approved or  cleared by the [place] FDA and has been authorized for detection and/or diagnosis of SARS-CoV-2 by FDA under an Emergency Use Authorization (EUA).  This EUA will remain in effect (meaning this test can be used) for the duration of the COVID-19 declaration under Section 564(b)(1) of the Act, 21 U.S.C. section 360bbb-3(b)(1), unless the authorization is terminated or revoked sooner.  Performed at [facility], [place] [address], [place], [place] [address]   Surgical pcr screen     Status: None   Collection Time: 03/19/20  3:20 AM   Specimen: Nasal Mucosa; Nasal Swab  Result Value Ref Range Status   MRSA, PCR NEGATIVE NEGATIVE Final   Staphylococcus aureus NEGATIVE NEGATIVE Final    Comment: (NOTE) The Xpert SA Assay (FDA approved for NASAL specimens in patients 23 years of age and older), is one component of a comprehensive surveillance program. It is not intended to diagnose infection nor to guide or monitor treatment. Performed at [facility], [place] [address], [place], [place] [address]          Radiology Studies: MR FEMUR  RIGHT W WO CONTRAST  Result Date: 03/20/2020 CLINICAL DATA:  Evaluate greater trochanteric mass EXAM: MRI OF THE RIGHT FEMUR WITHOUT AND WITH CONTRAST TECHNIQUE: Multiplanar, multisequence MR imaging of the right hip was performed both before and after administration of intravenous contrast. CONTRAST:  39mL GADAVIST GADOBUTROL 1 MMOL/ML IV SOLN COMPARISON:  CT 03/17/2020 FINDINGS: Bones/Joint/Cartilage T1 hypointense, T2 heterogeneously hyperintense marrow replacing lesion centered at the greater trochanter of the proximal right femur measuring approximately 4.0 x 2.5 x 2.5 cm (series 40, image 11; series 5, image 11). Mass demonstrates enhancement on postcontrast imaging with central area of non enhancement which may reflect necrosis. Comminuted pathologic fracture of the more superior aspect of the greater trochanter, better visualized on recent CT. There is edema and enhancement within the posterior and posteromedial soft tissues adjacent to the lesion which may reflect tumor extension and/or reactive changes secondary to the adjacent fracture (series 39, image 13). Right femoral head is intact without dislocation. No femoral head avascular necrosis. No additional marrow replacing lesions. No hip joint effusion. Ligaments Intact. Muscles and Tendons No acute myotendinous abnormality. Soft tissues No right inguinal lymphadenopathy. No soft tissue fluid collections. IMPRESSION: Enhancing marrow replacing lesion within  the greater trochanter of the proximal right femur measuring up to 4.0 cm with associated pathologic fracture. Edema and enhancement within the posterior and posteromedial soft tissues adjacent to the lesion may reflect tumor extension and/or reactive changes secondary to the adjacent fracture. Imaging features are nonspecific with primary differential considerations including metastatic disease and myeloma. Consider nuclear medicine bone scan to evaluate for the presence of any additional lesions.  Electronically Signed   By: Davina Poke D.O.   On: 03/20/2020 08:21   MR 3D Recon At Scanner  Result Date: 03/20/2020 CLINICAL DATA:  Pancreatic pseudocyst. EXAM: MRI ABDOMEN WITHOUT AND WITH CONTRAST (INCLUDING MRCP) TECHNIQUE: Multiplanar multisequence MR imaging of the abdomen was performed both before and after the administration of intravenous contrast. Heavily T2-weighted images of the biliary and pancreatic ducts were obtained, and three-dimensional MRCP images were rendered by post processing. CONTRAST:  39mL GADAVIST GADOBUTROL 1 MMOL/ML IV SOLN COMPARISON:  CT SCAN 03/17/2020.  MRI 11/07/2019. FINDINGS: Lower chest: UNREMARKABLE. Hepatobiliary: 8 mm T2 hyperintensity identified in the lateral segment left liver is stable since previous MRI. Postcontrast imaging suggests cavernous hemangioma as etiology. There is no evidence for gallstones, gallbladder wall thickening, or pericholecystic fluid. No intrahepatic or extrahepatic biliary dilation. Pancreas: As noted on recent CT scan, there is a 2.5 x 2.4 cm complex fluid collection in the head of pancreas subtle rim enhancement without evidence for internal architecture/enhancement. Lesion shows no appreciable diffusion restriction. No associated dilatation of the main pancreatic duct. Pancreatic tissue enhances throughout. There is some subtle edema in the peripancreatic fat around the head of pancreas. Spleen:  No splenomegaly. No focal mass lesion. Adrenals/Urinary Tract: Bilateral thickening in the adrenal glands is compatible with hyperplasia. Tiny cortical cysts noted in both kidneys no suspicious enhancing renal lesion. No hydronephrosis. Stomach/Bowel: Stomach is unremarkable. No gastric wall thickening. No evidence of outlet obstruction. Duodenum is normally positioned as is the ligament of Treitz. No small bowel or colonic dilatation within the visualized abdomen. Vascular/Lymphatic: Infrarenal abdominal aortic aneurysm measures up to 3.2 cm  diameter. There is no gastrohepatic or hepatoduodenal ligament lymphadenopathy. No retroperitoneal or mesenteric lymphadenopathy. Other:  No intraperitoneal free fluid. Musculoskeletal: No suspicious marrow enhancement within the visualized bony anatomy. IMPRESSION: 1. No substantial change in the complex cystic pancreatic head lesion noted on CT scan 2 days ago. This does not appear to be a duodenal diverticulum. Rapid interval appearance again suggests pseudocyst in this patient with a history of pancreatitis. No mural nodularity or central enhancement. Follow-up MRI in 3 months recommended to ensure resolution. 2. Trace edema noted around the head of pancreas. 3. 3.2 cm abdominal aortic aneurysm. Recommend followup by ultrasound in 3 years. This recommendation follows ACR consensus guidelines: White Paper of the ACR Incidental Findings Committee II on Vascular Findings. [facility] Coll Radiol 2013; 10:789-794 Electronically Signed   By: Misty Stanley M.D.   On: 03/20/2020 08:04   MR ABDOMEN MRCP W WO CONTAST  Result Date: 03/20/2020 CLINICAL DATA:  Pancreatic pseudocyst. EXAM: MRI ABDOMEN WITHOUT AND WITH CONTRAST (INCLUDING MRCP) TECHNIQUE: Multiplanar multisequence MR imaging of the abdomen was performed both before and after the administration of intravenous contrast. Heavily T2-weighted images of the biliary and pancreatic ducts were obtained, and three-dimensional MRCP images were rendered by post processing. CONTRAST:  43mL GADAVIST GADOBUTROL 1 MMOL/ML IV SOLN COMPARISON:  CT SCAN 03/17/2020.  MRI 11/07/2019. FINDINGS: Lower chest: UNREMARKABLE. Hepatobiliary: 8 mm T2 hyperintensity identified in the lateral segment left liver is stable since  previous MRI. Postcontrast imaging suggests cavernous hemangioma as etiology. There is no evidence for gallstones, gallbladder wall thickening, or pericholecystic fluid. No intrahepatic or extrahepatic biliary dilation. Pancreas: As noted on recent CT scan, there is a  2.5 x 2.4 cm complex fluid collection in the head of pancreas subtle rim enhancement without evidence for internal architecture/enhancement. Lesion shows no appreciable diffusion restriction. No associated dilatation of the main pancreatic duct. Pancreatic tissue enhances throughout. There is some subtle edema in the peripancreatic fat around the head of pancreas. Spleen:  No splenomegaly. No focal mass lesion. Adrenals/Urinary Tract: Bilateral thickening in the adrenal glands is compatible with hyperplasia. Tiny cortical cysts noted in both kidneys no suspicious enhancing renal lesion. No hydronephrosis. Stomach/Bowel: Stomach is unremarkable. No gastric wall thickening. No evidence of outlet obstruction. Duodenum is normally positioned as is the ligament of Treitz. No small bowel or colonic dilatation within the visualized abdomen. Vascular/Lymphatic: Infrarenal abdominal aortic aneurysm measures up to 3.2 cm diameter. There is no gastrohepatic or hepatoduodenal ligament lymphadenopathy. No retroperitoneal or mesenteric lymphadenopathy. Other:  No intraperitoneal free fluid. Musculoskeletal: No suspicious marrow enhancement within the visualized bony anatomy. IMPRESSION: 1. No substantial change in the complex cystic pancreatic head lesion noted on CT scan 2 days ago. This does not appear to be a duodenal diverticulum. Rapid interval appearance again suggests pseudocyst in this patient with a history of pancreatitis. No mural nodularity or central enhancement. Follow-up MRI in 3 months recommended to ensure resolution. 2. Trace edema noted around the head of pancreas. 3. 3.2 cm abdominal aortic aneurysm. Recommend followup by ultrasound in 3 years. This recommendation follows ACR consensus guidelines: White Paper of the ACR Incidental Findings Committee II on Vascular Findings. [facility] Coll Radiol 2013; 10:789-794 Electronically Signed   By: Misty Stanley M.D.   On: 03/20/2020 08:04        Scheduled Meds: .  baclofen  10 mg Oral BID  . calcium carbonate  1 tablet Oral BID  . carvedilol  3.125 mg Oral BID WC  . cetirizine  10 mg Oral Daily  . hydrOXYzine  10 mg Oral QHS  . pantoprazole  40 mg Oral Daily  . tamsulosin  0.4 mg Oral Daily  . ursodiol  300 mg Oral BID   Continuous Infusions: . lactated ringers 100 mL/hr at 03/20/20 5456     Time spent: 35 minutes with over 50% of the time coordinating the patient's care    Harold Hedge, DO [facility] Pager [phone]  Call night coverage person covering after 7pm

## 2020-03-20 NOTE — Progress Notes (Signed)
Progress Note   Subjective  Patient feeling better, less pain, tolerating clears. MRI femur done.    Objective   Vital signs in last 24 hours: Temp:  [97.5 F (36.4 C)-98.1 F (36.7 C)] 98.1 F (36.7 C) (06/25 1345) Pulse Rate:  [77-92] 77 (06/25 1345) Resp:  [16-17] 17 (06/25 1345) BP: (106-120)/(76-78) 120/78 (06/25 1345) SpO2:  [94 %-98 %] 98 % (06/25 1345) Weight:  [52.1 kg] 52.1 kg (06/25 0607) Last BM Date: 03/16/20 General:    white female in NAD Heart:  Regular rate and rhythm Lungs: Respirations even and unlabored, Abdomen:  Soft, some epigastric TTP Neurologic:  Alert and oriented,  grossly normal neurologically. Psych:  Cooperative. Normal mood and affect.  Intake/Output from previous day: 06/24 0701 - 06/25 0700 In: 1181.7 [P.O.:180; I.V.:1001.7] Out: -  Intake/Output this shift: Total I/O In: 360 [P.O.:360] Out: 0   Lab Results: Recent Labs    03/18/20 0548 03/19/20 0504 03/20/20 0442  WBC 15.7* 8.1 7.5  HGB 15.9* 12.5 12.2  HCT 48.1* 38.5 38.5  PLT 362 300 279   BMET Recent Labs    03/18/20 0548 03/19/20 0504 03/20/20 0442  NA 132* 139 140  K 3.7 3.4* 3.7  CL 104 107 107  CO2 21* 25 27  GLUCOSE 83 76 83  BUN 10 10 7   CREATININE 0.47 0.46 0.55  CALCIUM 8.5* 8.3* 8.3*   LFT Recent Labs    03/20/20 0442  PROT 4.9*  ALBUMIN 2.8*  AST 15  ALT 11  ALKPHOS 42  BILITOT 0.5   PT/INR No results for input(s): LABPROT, INR in the last 72 hours.  Studies/Results: MR FEMUR RIGHT W WO CONTRAST  Result Date: 03/20/2020 CLINICAL DATA:  Evaluate greater trochanteric mass EXAM: MRI OF THE RIGHT FEMUR WITHOUT AND WITH CONTRAST TECHNIQUE: Multiplanar, multisequence MR imaging of the right hip was performed both before and after administration of intravenous contrast. CONTRAST:  85mL GADAVIST GADOBUTROL 1 MMOL/ML IV SOLN COMPARISON:  CT 03/17/2020 FINDINGS: Bones/Joint/Cartilage T1 hypointense, T2 heterogeneously hyperintense marrow  replacing lesion centered at the greater trochanter of the proximal right femur measuring approximately 4.0 x 2.5 x 2.5 cm (series 40, image 11; series 5, image 11). Mass demonstrates enhancement on postcontrast imaging with central area of non enhancement which may reflect necrosis. Comminuted pathologic fracture of the more superior aspect of the greater trochanter, better visualized on recent CT. There is edema and enhancement within the posterior and posteromedial soft tissues adjacent to the lesion which may reflect tumor extension and/or reactive changes secondary to the adjacent fracture (series 39, image 13). Right femoral head is intact without dislocation. No femoral head avascular necrosis. No additional marrow replacing lesions. No hip joint effusion. Ligaments Intact. Muscles and Tendons No acute myotendinous abnormality. Soft tissues No right inguinal lymphadenopathy. No soft tissue fluid collections. IMPRESSION: Enhancing marrow replacing lesion within the greater trochanter of the proximal right femur measuring up to 4.0 cm with associated pathologic fracture. Edema and enhancement within the posterior and posteromedial soft tissues adjacent to the lesion may reflect tumor extension and/or reactive changes secondary to the adjacent fracture. Imaging features are nonspecific with primary differential considerations including metastatic disease and myeloma. Consider nuclear medicine bone scan to evaluate for the presence of any additional lesions. Electronically Signed   By: Davina Poke D.O.   On: 03/20/2020 08:21   MR 3D Recon At Scanner  Result Date: 03/20/2020 CLINICAL DATA:  Pancreatic pseudocyst. EXAM: MRI ABDOMEN WITHOUT  AND WITH CONTRAST (INCLUDING MRCP) TECHNIQUE: Multiplanar multisequence MR imaging of the abdomen was performed both before and after the administration of intravenous contrast. Heavily T2-weighted images of the biliary and pancreatic ducts were obtained, and  three-dimensional MRCP images were rendered by post processing. CONTRAST:  21mL GADAVIST GADOBUTROL 1 MMOL/ML IV SOLN COMPARISON:  CT SCAN 03/17/2020.  MRI 11/07/2019. FINDINGS: Lower chest: UNREMARKABLE. Hepatobiliary: 8 mm T2 hyperintensity identified in the lateral segment left liver is stable since previous MRI. Postcontrast imaging suggests cavernous hemangioma as etiology. There is no evidence for gallstones, gallbladder wall thickening, or pericholecystic fluid. No intrahepatic or extrahepatic biliary dilation. Pancreas: As noted on recent CT scan, there is a 2.5 x 2.4 cm complex fluid collection in the head of pancreas subtle rim enhancement without evidence for internal architecture/enhancement. Lesion shows no appreciable diffusion restriction. No associated dilatation of the main pancreatic duct. Pancreatic tissue enhances throughout. There is some subtle edema in the peripancreatic fat around the head of pancreas. Spleen:  No splenomegaly. No focal mass lesion. Adrenals/Urinary Tract: Bilateral thickening in the adrenal glands is compatible with hyperplasia. Tiny cortical cysts noted in both kidneys no suspicious enhancing renal lesion. No hydronephrosis. Stomach/Bowel: Stomach is unremarkable. No gastric wall thickening. No evidence of outlet obstruction. Duodenum is normally positioned as is the ligament of Treitz. No small bowel or colonic dilatation within the visualized abdomen. Vascular/Lymphatic: Infrarenal abdominal aortic aneurysm measures up to 3.2 cm diameter. There is no gastrohepatic or hepatoduodenal ligament lymphadenopathy. No retroperitoneal or mesenteric lymphadenopathy. Other:  No intraperitoneal free fluid. Musculoskeletal: No suspicious marrow enhancement within the visualized bony anatomy. IMPRESSION: 1. No substantial change in the complex cystic pancreatic head lesion noted on CT scan 2 days ago. This does not appear to be a duodenal diverticulum. Rapid interval appearance again  suggests pseudocyst in this patient with a history of pancreatitis. No mural nodularity or central enhancement. Follow-up MRI in 3 months recommended to ensure resolution. 2. Trace edema noted around the head of pancreas. 3. 3.2 cm abdominal aortic aneurysm. Recommend followup by ultrasound in 3 years. This recommendation follows ACR consensus guidelines: White Paper of the ACR Incidental Findings Committee II on Vascular Findings. [facility] Radiol 2013; [id number] Electronically Signed   By: Misty Stanley M.D.   On: 03/20/2020 08:04   MR ABDOMEN MRCP W WO CONTAST  Result Date: 03/20/2020 CLINICAL DATA:  Pancreatic pseudocyst. EXAM: MRI ABDOMEN WITHOUT AND WITH CONTRAST (INCLUDING MRCP) TECHNIQUE: Multiplanar multisequence MR imaging of the abdomen was performed both before and after the administration of intravenous contrast. Heavily T2-weighted images of the biliary and pancreatic ducts were obtained, and three-dimensional MRCP images were rendered by post processing. CONTRAST:  83mL GADAVIST GADOBUTROL 1 MMOL/ML IV SOLN COMPARISON:  CT SCAN 03/17/2020.  MRI 11/07/2019. FINDINGS: Lower chest: UNREMARKABLE. Hepatobiliary: 8 mm T2 hyperintensity identified in the lateral segment left liver is stable since previous MRI. Postcontrast imaging suggests cavernous hemangioma as etiology. There is no evidence for gallstones, gallbladder wall thickening, or pericholecystic fluid. No intrahepatic or extrahepatic biliary dilation. Pancreas: As noted on recent CT scan, there is a 2.5 x 2.4 cm complex fluid collection in the head of pancreas subtle rim enhancement without evidence for internal architecture/enhancement. Lesion shows no appreciable diffusion restriction. No associated dilatation of the main pancreatic duct. Pancreatic tissue enhances throughout. There is some subtle edema in the peripancreatic fat around the head of pancreas. Spleen:  No splenomegaly. No focal mass lesion. Adrenals/Urinary Tract: Bilateral  thickening in the  adrenal glands is compatible with hyperplasia. Tiny cortical cysts noted in both kidneys no suspicious enhancing renal lesion. No hydronephrosis. Stomach/Bowel: Stomach is unremarkable. No gastric wall thickening. No evidence of outlet obstruction. Duodenum is normally positioned as is the ligament of Treitz. No small bowel or colonic dilatation within the visualized abdomen. Vascular/Lymphatic: Infrarenal abdominal aortic aneurysm measures up to 3.2 cm diameter. There is no gastrohepatic or hepatoduodenal ligament lymphadenopathy. No retroperitoneal or mesenteric lymphadenopathy. Other:  No intraperitoneal free fluid. Musculoskeletal: No suspicious marrow enhancement within the visualized bony anatomy. IMPRESSION: 1. No substantial change in the complex cystic pancreatic head lesion noted on CT scan 2 days ago. This does not appear to be a duodenal diverticulum. Rapid interval appearance again suggests pseudocyst in this patient with a history of pancreatitis. No mural nodularity or central enhancement. Follow-up MRI in 3 months recommended to ensure resolution. 2. Trace edema noted around the head of pancreas. 3. 3.2 cm abdominal aortic aneurysm. Recommend followup by ultrasound in 3 years. This recommendation follows ACR consensus guidelines: White Paper of the ACR Incidental Findings Committee II on Vascular Findings. [facility] [employer] Radiol 2013; [id number] Electronically Signed   By: Misty Stanley M.D.   On: 03/20/2020 08:04       Assessment / Plan:   59 y/o female admitted with recurrent pancreatitis. Cystic lesion noted on CT at the head of the pancreas, increased in size compared to last exam. Prior EUS showed atypical cells that were thought to be reactive. Unclear cause of her pancreatitis given workup to date She has had sludge in her gallbladder previously but LFTs have been normal.  EUS with FNA per Dr. Ardis Hughs yesterday of pancreatic lesion, final path shows NO malignant cells. MRCP  also done which does not show any significant changes compared to recent CT. Appreciate surgical consult for consideration of empiric cholecystectomy in this situation with sludge noted and recurrent pancreatitis. This is being considered but they are awaiting workup of R femur lesion, which was noted incidentally on admission imaging and concerning for underlying malignancy. Pending orthopedic consult for possible biopsy to rule out malignancy, and bone scan. She has been placed on Actigall to treat sludge in the interim.   She is clinically feeling better, tolerating clears. I asked if she wanted to advance her diet this PM and she wishes to hold off on advancing until tomorrow and take it slow.  Dr. Benson Norway covering this weekend but will follow peripherally only unless new issues arise. We will see her again on Monday otherwise. Call with questions.  Jolly Mango, MD [facility]

## 2020-03-21 LAB — COMPREHENSIVE METABOLIC PANEL
ALT: 11 U/L (ref 0–44)
AST: 15 U/L (ref 15–41)
Albumin: 3 g/dL — ABNORMAL LOW (ref 3.5–5.0)
Alkaline Phosphatase: 48 U/L (ref 38–126)
Anion gap: 7 (ref 5–15)
BUN: <5 mg/dL — ABNORMAL LOW (ref 6–20)
CO2: 25 mmol/L (ref 22–32)
Calcium: 8.5 mg/dL — ABNORMAL LOW (ref 8.9–10.3)
Chloride: 104 mmol/L (ref 98–111)
Creatinine, Ser: 0.45 mg/dL (ref 0.44–1.00)
GFR calc Af Amer: >60 mL/min (ref >60)
GFR calc non Af Amer: >60 mL/min (ref >60)
Glucose, Bld: 91 mg/dL (ref 70–99)
Potassium: 3.6 mmol/L (ref 3.5–5.1)
Sodium: 136 mmol/L (ref 135–145)
Total Bilirubin: 0.6 mg/dL (ref 0.3–1.2)
Total Protein: 5.7 g/dL — ABNORMAL LOW (ref 6.5–8.1)

## 2020-03-21 LAB — LIPASE, BLOOD: Lipase: 208 U/L — ABNORMAL HIGH (ref 11–51)

## 2020-03-21 MED ORDER — ACETAMINOPHEN 325 MG PO TABS
650.0000 mg | ORAL_TABLET | Freq: Four times a day (QID) | ORAL | Status: DC | PRN
  Administered 2020-03-21 – 2020-03-24 (×3): 650 mg via ORAL
  Filled 2020-03-21 (×3): qty 2

## 2020-03-21 NOTE — Progress Notes (Addendum)
PROGRESS NOTE    [name]    Code Status: Full Code  QMV:[id number] DOB: Feb 08, 1961 DOA: 03/17/2020 LOS: 4 days  PCP: [name], DO CC:  Chief Complaint  Patient presents with  . Abdominal Pain       Hospital Summary   This is a 59 year old female with history of recurrent idiopathic pancreatitis, paroxysmal A. fib, CAD s/p CABG, hypertension, anxiety, recent CVA, recent fall in March, AAA who presented to the ED on 6/22 with persistent epigastric abdominal pain with associated nausea and vomiting.  Had been taking Flomax in order to pass a kidney stone recently and was having some hematuria but this had resolved.  ED Course: Afebrile, mildly tachycardic, BP 136/96.  CBC notable for leukocytosis 16.8, hemoconcentration with hemoglobin 15.4.  BMP was notable for potassium of 3.0 and glucose 138.  Lipase elevated 6271.  Urinalysis was consistent with infection and reflex to culture.  Chest x-ray negative. GI was consulted for recurrent pancreatitis.    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  6/24:  - EGD/EUS with Dr. Ardis Hughs.  Abnormal head of pancreas without discrete solid or anechoic cystic lesions.  - FNA performed and preliminary review showing no neoplastic cells.  CBD normal and nondilated.   - GI recommended interval imaging of the pancreas with MRI in 2 to 3 months as well as general surgery consult to consider cholecystectomy. - General surgery recommended Cardiology consult for cardiac clearance  6/25:  - cardiology cleared patient for cholecystectomy -General surgery holding off on cholecystectomy until resolution of lipase and  work-up of femur lesion - MRI right femur: Enhancing marrow replacing lesion within the greater trochanter of the proximal right femur measuring up to 4.0 cm with associated pathologic fracture. Edema and enhancement within the posterior and posteromedial soft tissues adjacent to the lesion may reflect tumor extension and/or reactive changes secondary to the adjacent fracture.  - Orthopedic surgery consulted for bone biopsy and eval of fracture -not comfortable with biopsy of this lesion and recommending IR consult and patient is not a candidate for fracture repair  6/26:  -IR consulted for right femur biopsy    A & P   Principal Problem:   Acute on chronic pancreatitis ([place]) Active Problems:   Cerebrovascular accident (CVA) due to occlusion of left middle cerebral artery (HCC)   Hyperlipidemia LDL goal <70   Hypokalemia   CAD in native artery   Benign essential HTN   Chronic diastolic CHF (congestive heart failure) (HCC)   Paroxysmal atrial fibrillation ([place])   Anxiety   Recurrent pancreatitis   Preoperative evaluation of a medical condition to rule out surgical contraindications (TAR required)   Pure hypercholesterolemia   1. Recurrent acute on chronic idiopathic pancreatitis with abnormal finding of pancreatic head s/p EGD/EUS/FNA with Dr. Ardis Hughs on 6/24  a. S/p EUS with FNA in March 2021 with atypical cells thought to be inflammatory at the time.  Sludge noted in gallbladder.  CT scan in May showed improved inflammatory changes and decreased size of suspected pseudocyst b. Lipase increased from 189->208 -de-escalate diet continue fluids c. EGD/EUS with Dr. Ardis Hughs 6/24:.  Abnormal head of pancreas without discrete solid or  anechoic cystic lesions.  FNA performed. CBD normal and nondilated.  GI recommended interval imaging of the pancreas with MRI in 2 to 3 months. d. Final pathology from FNA-no malignant cells e. MRI abdomen/MRCP-similar to prior scans  2. Biliary sludge   Cholelithiasis a. General surgery consulted to evaluate for cholecystectomy in setting of recurrent pancreatitis and biliary sludge-awaiting FNA results from EUS.  Cardiology consulted for risk stratification.  Continue Actigall to address biliary sludge/small gallstones.  Sludge/gallstones could possibly be the cause of pancreatitis but would entertain cholecystectomy after further work-up and resolution of abdominal pain/normalization of lipase (currently 208)  3. 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 No known history of cancer but has had significant weight loss in the past few months b. MRI with and without contrast: Enhancing marrow replacing lesion within the greater trochanter of the proximal right femur measuring up to 4.0 cm with associated pathologic fracture. Edema and enhancement within the posterior and posteromedial soft tissues adjacent to the lesion may reflect tumor extension and/or reactive changes secondary to the adjacent fracture.  c. nuclear med bone scan to evaluate for the presence of any additional lesions d. Orthopedic surgery consulted to evaluate pathologic fracture and bone biopsy -recommended IR biopsy and not a candidate for fracture repair e. IR consulted  4. Reactive leukocytosis, resolved  5. Tick removed from posterior neck a. Unknown duration b. 6/23 incidental finding on the posterior neck by nursing staff.  Removed.  Unfortunately no picture was obtained.  Nurse stated tick was not engorged c. No sign of tickborne illness, will hold off on empiric treatment  6. Asymptomatic E. coli UTI a. Hold antibiotics for now.  Will start if she becomes febrile or has symptoms  7. History of nephrolithiasis, resolved a. On Flomax  8. Hypotension Resolved  9. History of gastritis/duodenitis On PPI  10. Hyperlipidemia not on medication  11. Hypertension  a. Continue low-dose  Coreg for now  12. Chronic diastolic heart failure, not in acute exacerbation a. Will monitor for volume overload as she is getting high volumes of fluids pancreatitis b. Daily weights and I/O  13. Paroxysmal A. fib on Xarelto and Coreg a. Hold Xarelto as above -pending GI recommendations  14. Anxiety stable on home Vistaril  15. Stable infrarenal AAA a. Repeat ultrasound in 3 years for surveillance  DVT prophylaxis: Place and maintain sequential compression device Start: 03/18/20 1526 Place and maintain sequential compression device Start: 03/18/20 1526   Family Communication: Husband at bedside yesterday  Disposition Plan:  Status is: Inpatient  Remains inpatient appropriate because:IV treatments appropriate due to intensity of illness or inability to take PO and Inpatient level of care appropriate due to severity of illness   Dispo: The patient is from: Home              Anticipated d/c is to: Home              Anticipated d/c date is: > 3 days              Patient currently is not medically stable to d/c.          Pressure injury documentation    None  Consultants  GI  General surgery Cardiology Orthopedic surgery IR  Procedures  EUS/ERCP/FNA 6/24  Antibiotics   Anti-infectives (From admission, onward)   Start     Dose/Rate Route Frequency Ordered Stop   03/17/20 1100  cefTRIAXone (ROCEPHIN) 1  g in sodium chloride 0.9 % 100 mL IVPB        1 g 200 mL/hr over 30 Minutes Intravenous  Once 03/17/20 1046 03/17/20 1551        Subjective   Denies any nausea or vomiting, diarrhea or constipation.  Tolerating her diet.  Has some mild abdominal pain with palpation but otherwise no issues.  Has been ambulating cautiously in the hall.  No issues overnight and no other events Objective   Vitals:   03/20/20 0607 03/20/20 1345 03/20/20 2109 03/21/20 0627  BP: 106/76 120/78 125/70 122/86  Pulse: 87 77 79 91  Resp: 16 17 18 16   Temp: (!) 97.5 F (36.4 C)  98.1 F (36.7 C) 98 F (36.7 C) 97.6 F (36.4 C)  TempSrc: Oral Oral Oral Oral  SpO2: 96% 98% 98% 97%  Weight: 52.1 kg     Height:        Intake/Output Summary (Last 24 hours) at 03/21/2020 1131 Last data filed at 03/21/2020 0940 Gross per 24 hour  Intake 2040.48 ml  Output --  Net 2040.48 ml   Filed Weights   03/17/20 0658 03/19/20 1032 03/20/20 0607  Weight: 52.6 kg 52.6 kg 52.1 kg    Examination:  Physical Exam Vitals and nursing note reviewed.  Constitutional:      Appearance: Normal appearance.  HENT:     Head: Normocephalic and atraumatic.  Eyes:     Conjunctiva/sclera: Conjunctivae normal.  Cardiovascular:     Rate and Rhythm: Normal rate and regular rhythm.  Pulmonary:     Effort: Pulmonary effort is normal.     Breath sounds: Normal breath sounds.  Abdominal:     General: Abdomen is flat.     Palpations: Abdomen is soft.     Tenderness: There is abdominal tenderness in the epigastric area.  Musculoskeletal:        General: No swelling or tenderness.  Skin:    Coloration: Skin is not jaundiced or pale.  Neurological:     Mental Status: She is alert. Mental status is at baseline.  Psychiatric:        Mood and Affect: Mood normal.        Behavior: Behavior normal.     Data Reviewed: I have personally reviewed following labs and imaging studies  CBC: Recent Labs  Lab 03/17/20 0231 03/18/20 0548 03/19/20 0504 03/20/20 0442  WBC 16.8* 15.7* 8.1 7.5  HGB 15.4* 15.9* 12.5 12.2  HCT 47.5* 48.1* 38.5 38.5  MCV 98.8 95.4 95.8 98.2  PLT 377 362 300 834   Basic Metabolic Panel: Recent Labs  Lab 03/17/20 0231 03/17/20 0817 03/18/20 0548 03/19/20 0504 03/20/20 0442 03/21/20 0508  NA 138  --  132* 139 140 136  K 3.0*  --  3.7 3.4* 3.7 3.6  CL 103  --  104 107 107 104  CO2 27  --  21* 25 27 25   GLUCOSE 138*  --  83 76 83 91  BUN 11  --  10 10 7  <5*  CREATININE 0.58  --  0.47 0.46 0.55 0.45  CALCIUM 8.8*  --  8.5* 8.3* 8.3* 8.5*  MG  --  2.2  2.0  --   --   --    GFR: Estimated Creatinine Clearance: 54.4 mL/min (by C-G formula based on SCr of 0.45 mg/dL). Liver Function Tests: Recent Labs  Lab 03/17/20 0231 03/18/20 0548 03/20/20 0442 03/21/20 0508  AST 19 20 15 15   ALT 15 13  11 11  ALKPHOS 70 62 42 48  BILITOT 0.4 0.5 0.5 0.6  PROT 7.2 6.2* 4.9* 5.7*  ALBUMIN 4.2 3.2* 2.8* 3.0*   Recent Labs  Lab 03/17/20 0231 03/20/20 0442 03/21/20 0508  LIPASE 6,271* 189* 208*   No results for input(s): AMMONIA in the last 168 hours. Coagulation Profile: Recent Labs  Lab 03/17/20 0817  INR 1.2   Cardiac Enzymes: No results for input(s): CKTOTAL, CKMB, CKMBINDEX, TROPONINI in the last 168 hours. BNP (last 3 results) No results for input(s): PROBNP in the last 8760 hours. HbA1C: No results for input(s): HGBA1C in the last 72 hours. CBG: No results for input(s): GLUCAP in the last 168 hours. Lipid Profile: No results for input(s): CHOL, HDL, LDLCALC, TRIG, CHOLHDL, LDLDIRECT in the last 72 hours. Thyroid Function Tests: No results for input(s): TSH, T4TOTAL, FREET4, T3FREE, THYROIDAB in the last 72 hours. Anemia Panel: No results for input(s): VITAMINB12, FOLATE, FERRITIN, TIBC, IRON, RETICCTPCT in the last 72 hours. Sepsis Labs: Recent Labs  Lab 03/17/20 0817  LATICACIDVEN 1.1    Recent Results (from the past 240 hour(s))  Blood Culture (routine x 2)     Status: None (Preliminary result)   Collection Time: 03/17/20  8:17 AM   Specimen: BLOOD LEFT HAND  Result Value Ref Range Status   Specimen Description   Final    BLOOD LEFT HAND Performed at [facility], [place] [address] [place], [place] [address]    Special Requests   Final    BOTTLES DRAWN AEROBIC AND ANAEROBIC Blood Culture results may not be optimal due to an inadequate volume of blood received in culture bottles Performed at [place] [address] [place], [place] [address]    Culture   Final    NO  GROWTH 4 DAYS Performed at [facility], [place] [address] [place], [place] [address]    Report Status PENDING  Incomplete  Blood Culture (routine x 2)     Status: None (Preliminary result)   Collection Time: 03/17/20  9:14 AM   Specimen: BLOOD  Result Value Ref Range Status   Specimen Description   Final    BLOOD RIGHT ANTECUBITAL Performed at [place] [address], [place], [place] [address]    Special Requests   Final    BOTTLES DRAWN AEROBIC AND ANAEROBIC Blood Culture results may not be optimal due to an excessive volume of blood received in culture bottles Performed at [place] [address], [place], [place] [address]    Culture   Final    NO GROWTH 4 DAYS Performed at [facility], [place] [address] [place], [place] [address]    Report Status PENDING  Incomplete  Urine culture     Status: Abnormal   Collection Time: 03/17/20  9:48 AM   Specimen: Urine, Clean Catch  Result Value Ref Range Status   Specimen Description   Final    URINE, CLEAN CATCH Performed at [facility], [place] [address] [place], [place] [address]    Special Requests   Final    NONE Performed at [facility], [place] [address] [place],  [address]    Culture >=100,000 COLONIES/mL ESCHERICHIA COLI (A)  Final   Report Status 03/19/2020 FINAL  Final   Organism ID, Bacteria ESCHERICHIA COLI (A)  Final      Susceptibility   Escherichia coli - MIC*    AMPICILLIN <=2  SENSITIVE Sensitive     CEFAZOLIN <=4 SENSITIVE Sensitive     CEFTRIAXONE <=0.25 SENSITIVE Sensitive     CIPROFLOXACIN <=0.25 SENSITIVE Sensitive     GENTAMICIN <=1 SENSITIVE Sensitive     IMIPENEM <=0.25 SENSITIVE Sensitive     NITROFURANTOIN <=16 SENSITIVE Sensitive     TRIMETH/SULFA <=20 SENSITIVE Sensitive     AMPICILLIN/SULBACTAM <=2 SENSITIVE Sensitive     PIP/TAZO <=4 SENSITIVE Sensitive     * >=100,000 COLONIES/mL ESCHERICHIA COLI   SARS Coronavirus 2 by RT PCR (hospital order, performed in [place] hospital lab) Nasopharyngeal Nasopharyngeal Swab     Status: None   Collection Time: 03/17/20  9:58 PM   Specimen: Nasopharyngeal Swab  Result Value Ref Range Status   SARS Coronavirus 2 NEGATIVE NEGATIVE Final    Comment: (NOTE) SARS-CoV-2 target nucleic acids are NOT DETECTED.  The SARS-CoV-2 RNA is generally detectable in upper and lower respiratory specimens during the acute phase of infection. The lowest concentration of SARS-CoV-2 viral copies this assay can detect is 250 copies / mL. A negative result does not preclude SARS-CoV-2 infection and should not be used as the sole basis for treatment or other patient management decisions.  A negative result may occur with improper specimen collection / handling, submission of specimen other than nasopharyngeal swab, presence of viral mutation(s) within the areas targeted by this assay, and inadequate number of viral copies (<250 copies / mL). A negative result must be combined with clinical observations, patient history, and epidemiological information.  Fact Sheet for Patients:   StrictlyIdeas.no  Fact Sheet for Healthcare Providers: BankingDealers.co.za  This test is not yet approved or  cleared by the [place] FDA and has been authorized for detection and/or diagnosis of SARS-CoV-2 by FDA under an Emergency Use Authorization (EUA).  This EUA will remain in effect (meaning this test can be used) for the duration of the COVID-19 declaration under Section 564(b)(1) of the Act, 21 U.S.C. section 360bbb-3(b)(1), unless the authorization is terminated or revoked sooner.  Performed at [facility], [place] [address] [place], [place] [address]   Surgical pcr screen     Status: None   Collection Time: 03/19/20  3:20 AM   Specimen: Nasal Mucosa; Nasal Swab  Result Value Ref Range Status   MRSA,  PCR NEGATIVE NEGATIVE Final   Staphylococcus aureus NEGATIVE NEGATIVE Final    Comment: (NOTE) The Xpert SA Assay (FDA approved for NASAL specimens in patients 85 years of age and older), is one component of a comprehensive surveillance program. It is not intended to diagnose infection nor to guide or monitor treatment. Performed at [facility], [place] [address] [place], [place] [address]          Radiology Studies: MR FEMUR RIGHT W WO CONTRAST  Result Date: 03/20/2020 CLINICAL DATA:  Evaluate greater trochanteric mass EXAM: MRI OF THE RIGHT FEMUR WITHOUT AND WITH CONTRAST TECHNIQUE: Multiplanar, multisequence MR imaging of the right hip was performed both before and after administration of intravenous contrast. CONTRAST:  56mL GADAVIST GADOBUTROL 1 MMOL/ML IV SOLN COMPARISON:  CT 03/17/2020 FINDINGS: Bones/Joint/Cartilage T1 hypointense, T2 heterogeneously hyperintense marrow replacing lesion centered at the greater trochanter of the proximal right femur measuring approximately 4.0 x 2.5 x 2.5 cm (series 40, image 11; series 5, image 11). Mass demonstrates enhancement on postcontrast imaging with central area of non enhancement which may reflect necrosis. Comminuted pathologic fracture of the more superior aspect of the greater trochanter, better visualized on  recent CT. There is edema and enhancement within the posterior and posteromedial soft tissues adjacent to the lesion which may reflect tumor extension and/or reactive changes secondary to the adjacent fracture (series 39, image 13). Right femoral head is intact without dislocation. No femoral head avascular necrosis. No additional marrow replacing lesions. No hip joint effusion. Ligaments Intact. Muscles and Tendons No acute myotendinous abnormality. Soft tissues No right inguinal lymphadenopathy. No soft tissue fluid collections. IMPRESSION: Enhancing marrow replacing lesion within the greater trochanter of the proximal  right femur measuring up to 4.0 cm with associated pathologic fracture. Edema and enhancement within the posterior and posteromedial soft tissues adjacent to the lesion may reflect tumor extension and/or reactive changes secondary to the adjacent fracture. Imaging features are nonspecific with primary differential considerations including metastatic disease and myeloma. Consider nuclear medicine bone scan to evaluate for the presence of any additional lesions. Electronically Signed   By: Davina Poke D.O.   On: 03/20/2020 08:21   MR 3D Recon At Scanner  Result Date: 03/20/2020 CLINICAL DATA:  Pancreatic pseudocyst. EXAM: MRI ABDOMEN WITHOUT AND WITH CONTRAST (INCLUDING MRCP) TECHNIQUE: Multiplanar multisequence MR imaging of the abdomen was performed both before and after the administration of intravenous contrast. Heavily T2-weighted images of the biliary and pancreatic ducts were obtained, and three-dimensional MRCP images were rendered by post processing. CONTRAST:  84mL GADAVIST GADOBUTROL 1 MMOL/ML IV SOLN COMPARISON:  CT SCAN 03/17/2020.  MRI 11/07/2019. FINDINGS: Lower chest: UNREMARKABLE. Hepatobiliary: 8 mm T2 hyperintensity identified in the lateral segment left liver is stable since previous MRI. Postcontrast imaging suggests cavernous hemangioma as etiology. There is no evidence for gallstones, gallbladder wall thickening, or pericholecystic fluid. No intrahepatic or extrahepatic biliary dilation. Pancreas: As noted on recent CT scan, there is a 2.5 x 2.4 cm complex fluid collection in the head of pancreas subtle rim enhancement without evidence for internal architecture/enhancement. Lesion shows no appreciable diffusion restriction. No associated dilatation of the main pancreatic duct. Pancreatic tissue enhances throughout. There is some subtle edema in the peripancreatic fat around the head of pancreas. Spleen:  No splenomegaly. No focal mass lesion. Adrenals/Urinary Tract: Bilateral thickening  in the adrenal glands is compatible with hyperplasia. Tiny cortical cysts noted in both kidneys no suspicious enhancing renal lesion. No hydronephrosis. Stomach/Bowel: Stomach is unremarkable. No gastric wall thickening. No evidence of outlet obstruction. Duodenum is normally positioned as is the ligament of Treitz. No small bowel or colonic dilatation within the visualized abdomen. Vascular/Lymphatic: Infrarenal abdominal aortic aneurysm measures up to 3.2 cm diameter. There is no gastrohepatic or hepatoduodenal ligament lymphadenopathy. No retroperitoneal or mesenteric lymphadenopathy. Other:  No intraperitoneal free fluid. Musculoskeletal: No suspicious marrow enhancement within the visualized bony anatomy. IMPRESSION: 1. No substantial change in the complex cystic pancreatic head lesion noted on CT scan 2 days ago. This does not appear to be a duodenal diverticulum. Rapid interval appearance again suggests pseudocyst in this patient with a history of pancreatitis. No mural nodularity or central enhancement. Follow-up MRI in 3 months recommended to ensure resolution. 2. Trace edema noted around the head of pancreas. 3. 3.2 cm abdominal aortic aneurysm. Recommend followup by ultrasound in 3 years. This recommendation follows ACR consensus guidelines: White Paper of the ACR Incidental Findings Committee II on Vascular Findings. [facility] Coll Radiol 2013; 10:789-794 Electronically Signed   By: Misty Stanley M.D.   On: 03/20/2020 08:04   MR ABDOMEN MRCP W WO CONTAST  Result Date: 03/20/2020 CLINICAL DATA:  Pancreatic pseudocyst. EXAM: MRI  ABDOMEN WITHOUT AND WITH CONTRAST (INCLUDING MRCP) TECHNIQUE: Multiplanar multisequence MR imaging of the abdomen was performed both before and after the administration of intravenous contrast. Heavily T2-weighted images of the biliary and pancreatic ducts were obtained, and three-dimensional MRCP images were rendered by post processing. CONTRAST:  65mL GADAVIST GADOBUTROL 1 MMOL/ML  IV SOLN COMPARISON:  CT SCAN 03/17/2020.  MRI 11/07/2019. FINDINGS: Lower chest: UNREMARKABLE. Hepatobiliary: 8 mm T2 hyperintensity identified in the lateral segment left liver is stable since previous MRI. Postcontrast imaging suggests cavernous hemangioma as etiology. There is no evidence for gallstones, gallbladder wall thickening, or pericholecystic fluid. No intrahepatic or extrahepatic biliary dilation. Pancreas: As noted on recent CT scan, there is a 2.5 x 2.4 cm complex fluid collection in the head of pancreas subtle rim enhancement without evidence for internal architecture/enhancement. Lesion shows no appreciable diffusion restriction. No associated dilatation of the main pancreatic duct. Pancreatic tissue enhances throughout. There is some subtle edema in the peripancreatic fat around the head of pancreas. Spleen:  No splenomegaly. No focal mass lesion. Adrenals/Urinary Tract: Bilateral thickening in the adrenal glands is compatible with hyperplasia. Tiny cortical cysts noted in both kidneys no suspicious enhancing renal lesion. No hydronephrosis. Stomach/Bowel: Stomach is unremarkable. No gastric wall thickening. No evidence of outlet obstruction. Duodenum is normally positioned as is the ligament of Treitz. No small bowel or colonic dilatation within the visualized abdomen. Vascular/Lymphatic: Infrarenal abdominal aortic aneurysm measures up to 3.2 cm diameter. There is no gastrohepatic or hepatoduodenal ligament lymphadenopathy. No retroperitoneal or mesenteric lymphadenopathy. Other:  No intraperitoneal free fluid. Musculoskeletal: No suspicious marrow enhancement within the visualized bony anatomy. IMPRESSION: 1. No substantial change in the complex cystic pancreatic head lesion noted on CT scan 2 days ago. This does not appear to be a duodenal diverticulum. Rapid interval appearance again suggests pseudocyst in this patient with a history of pancreatitis. No mural nodularity or central enhancement.  Follow-up MRI in 3 months recommended to ensure resolution. 2. Trace edema noted around the head of pancreas. 3. 3.2 cm abdominal aortic aneurysm. Recommend followup by ultrasound in 3 years. This recommendation follows ACR consensus guidelines: White Paper of the ACR Incidental Findings Committee II on Vascular Findings. [facility] [facility] Coll Radiol 2013; 10:789-794 Electronically Signed   By: Misty Stanley M.D.   On: 03/20/2020 08:04        Scheduled Meds: . baclofen  10 mg Oral BID  . calcium carbonate  1 tablet Oral BID  . carvedilol  3.125 mg Oral BID WC  . cetirizine  10 mg Oral Daily  . hydrOXYzine  10 mg Oral QHS  . pantoprazole  40 mg Oral Daily  . tamsulosin  0.4 mg Oral Daily  . ursodiol  300 mg Oral BID   Continuous Infusions: . lactated ringers 100 mL/hr at 03/21/20 1106     Time spent: 36 minutes with over 50% of the time coordinating the patient's care    Harold Hedge, DO [facility] Pager [phone] [phone]

## 2020-03-21 NOTE — H&P (Signed)
Chief Complaint: Patient was seen in consultation today for  Chief Complaint  Patient presents with  . Abdominal Pain    Referring Physician(s): Dr. Neysa Bonito  Supervising Physician: Sandi Mariscal  Patient Status: [facility] - In-pt  History of Present Illness: [name] is a 59 y.o. female with a medical history that includes a left MCA stroke (2018), paroxysmal atrial fibrillation, NSTEMI, AAA and recurrent pancreatitis. She is currently being treated for a kidney stone with Flomax.   She presented to the [place] ED 03/17/20 with abdominal pain, nausea, vomiting and diaphoresis. She also complained of right hip pain secondary to a fall in early March. Imaging was performed.   CT abdomen/pelvis with contrast 03/17/20:  1. There appears to be lytic destruction involving the greater trochanter of the proximal right femur with associated pathologic fracture. This is concerning for possible malignancy or metastatic disease. MRI or bone scan is recommended for further evaluation. 2. Rounded low density is noted in the pancreatic head which appears to be enlarged compared to prior exam, currently measuring 31 x 26 mm. 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 Clinical and laboratory correlation is recommended, and MRI also should be considered to rule out potential neoplasm or malignancy. 3. Grossly stable 3.3 cm infrarenal abdominal aortic aneurysm.  MR right femur 03/20/20: Enhancing marrow replacing lesion within the greater trochanter of the proximal right femur measuring up to 4.0 cm with associated pathologic fracture. Edema and enhancement within the posterior and posteromedial soft tissues adjacent to the lesion may reflect tumor extension and/or reactive changes secondary to the adjacent fracture. Imaging features are nonspecific with primary differential considerations including metastatic disease and myeloma  MR  abdomen MRCP 03/19/20: 1. No substantial change in the complex cystic pancreatic head lesion noted on CT scan 2 days ago. This does not appear to be a duodenal diverticulum. Rapid interval appearance again suggests pseudocyst in this patient with a history of pancreatitis. No mural nodularity or central enhancement. Follow-up MRI in 3 months recommended to ensure resolution. 2. Trace edema noted around the head of pancreas. 3. 3.2 cm abdominal aortic aneurysm.   Interventional Radiology has been asked to evaluate this patient for a CT-guided right femur lesion biopsy for further work-up and diagnosis.   Past Medical History:  Diagnosis Date  . AAA (abdominal aortic aneurysm) ([place])    a. 3.3cm infrarenal by CT 02/2020.  . Angio-edema   . Arthritis   . CAD (coronary artery disease)    a. s/p NSTEMI in 10/2017 with cath showing LM disease --> s/p CABG on 11/23/2017 with LIMA-LAD and SVG-OM.   [name] Carotid stenosis    a. Duplex 11/2018 6-01% RICA, 09-32% LICA.  [name] Hay fever   . Ischemic cardiomyopathy    a. EF 35-40% by echo in 10/2017. b. 55% by echo 01/2020.  . NSTEMI (non-ST elevated myocardial infarction) ([place])   . PAF (paroxysmal atrial fibrillation) ([place])    Diagnosed by ILR  . Pancreatitis   . Pneumonia 11/16/2017  . Stroke [facility]) 08/23/2017   Left MCA infarct status post TPA and thrombectomy  . Urticaria     Past Surgical History:  Procedure Laterality Date  . BIOPSY  12/02/2019   Procedure: BIOPSY;  Surgeon: Rush Landmark Telford Nab., MD;  Location: [place];  Service: Gastroenterology;;  . CORONARY ARTERY BYPASS GRAFT N/A 11/23/2017   Procedure: CORONARY ARTERY BYPASS GRAFTING (CABG) x 2, ON PUMP, USING  LEFT INTERNAL MAMMARY ARTERY AND RIGHT GREATER SAPHENOUS VEIN HARVESTED ENDOSCOPICALLY;  Surgeon: Gaye Pollack, MD;  Location: [place];  Service: Open Heart Surgery;  Laterality: N/A;  . CRYOABLATION     cervical  . ESOPHAGOGASTRODUODENOSCOPY (EGD) WITH PROPOFOL N/A 12/02/2019    Procedure: ESOPHAGOGASTRODUODENOSCOPY (EGD) WITH PROPOFOL;  Surgeon: Rush Landmark Telford Nab., MD;  Location: [place];  Service: Gastroenterology;  Laterality: N/A;  . ESOPHAGOGASTRODUODENOSCOPY (EGD) WITH PROPOFOL N/A 03/19/2020   Procedure: ESOPHAGOGASTRODUODENOSCOPY (EGD) WITH PROPOFOL;  Surgeon: Milus Banister, MD;  Location: [facility];  Service: Endoscopy;  Laterality: N/A;  . EUS N/A 03/19/2020   Procedure: UPPER ENDOSCOPIC ULTRASOUND (EUS) RADIAL;  Surgeon: Milus Banister, MD;  Location: [facility];  Service: Endoscopy;  Laterality: N/A;  . EUS N/A 03/19/2020   Procedure: UPPER ENDOSCOPIC ULTRASOUND (EUS) LINEAR;  Surgeon: Milus Banister, MD;  Location: [facility];  Service: Endoscopy;  Laterality: N/A;  . FINE NEEDLE ASPIRATION  12/02/2019   Procedure: FINE NEEDLE ASPIRATION (FNA) LINEAR;  Surgeon: Irving Copas., MD;  Location: [place];  Service: Gastroenterology;;  . FINE NEEDLE ASPIRATION N/A 03/19/2020   Procedure: FINE NEEDLE ASPIRATION (FNA) LINEAR;  Surgeon: Milus Banister, MD;  Location: [facility];  Service: Endoscopy;  Laterality: N/A;  . IR ANGIO INTRA EXTRACRAN SEL COM CAROTID INNOMINATE UNI R MOD SED  08/21/2017  . IR ANGIO VERTEBRAL SEL SUBCLAVIAN INNOMINATE UNI R MOD SED  08/21/2017  . IR PERCUTANEOUS ART THROMBECTOMY/INFUSION INTRACRANIAL INC DIAG ANGIO  08/21/2017  . IR RADIOLOGIST EVAL & MGMT  10/30/2017  . LOOP RECORDER INSERTION N/A 08/28/2017   Procedure: LOOP RECORDER INSERTION;  Surgeon: Constance Haw, MD;  Location: [place] [facility];  Service: Cardiovascular;  Laterality: N/A;  . RADIOLOGY WITH ANESTHESIA N/A 08/21/2017   Procedure: RADIOLOGY WITH ANESTHESIA;  Surgeon: Luanne Bras, MD;  Location: [place];  Service: Radiology;  Laterality: N/A;  . RIGHT/LEFT HEART CATH AND CORONARY ANGIOGRAPHY N/A 11/20/2017   Procedure: RIGHT/LEFT HEART CATH AND CORONARY ANGIOGRAPHY;  Surgeon: Jettie Booze, MD;  Location: [place]  CV [facility];  Service: Cardiovascular;  Laterality: N/A;  . TEE WITHOUT CARDIOVERSION N/A 08/28/2017   Procedure: TRANSESOPHAGEAL ECHOCARDIOGRAM (TEE);  Surgeon: Fay Records, MD;  Location: [place];  Service: Cardiovascular;  Laterality: N/A;  . TEE WITHOUT CARDIOVERSION N/A 11/23/2017   Procedure: TRANSESOPHAGEAL ECHOCARDIOGRAM (TEE);  Surgeon: Gaye Pollack, MD;  Location: [place];  Service: Open Heart Surgery;  Laterality: N/A;  . TUBAL LIGATION    . UPPER ESOPHAGEAL ENDOSCOPIC ULTRASOUND (EUS) N/A 12/02/2019   Procedure: UPPER ESOPHAGEAL ENDOSCOPIC ULTRASOUND (EUS);  Surgeon: Irving Copas., MD;  Location: [place];  Service: Gastroenterology;  Laterality: N/A;    Allergies: Dilaudid [hydromorphone], Entresto [sacubitril-valsartan], Lopressor [metoprolol tartrate], Allegra allergy [fexofenadine hcl], Nsaids, Gabapentin (once-daily), and Lipitor [atorvastatin]  Medications: Prior to Admission medications   Medication Sig Start Date End Date Taking? Authorizing Provider  acetaminophen (TYLENOL) 325 MG tablet Take 2 tablets (650 mg total) by mouth every 6 (six) hours as needed for mild pain or moderate pain (or Fever >/= 101). 09/13/19  Yes Rai, Ripudeep K, MD  azelastine (ASTELIN) 0.1 % nasal spray Place 1-2 sprays into both nostrils 2 (two) times daily as needed for rhinitis. Use in each nostril as directed 09/17/19  Yes Bobbitt, [place] Muta, MD  baclofen (LIORESAL) 10 MG tablet Take 10 mg by mouth in the morning and at bedtime.   Yes [provider]  calcium carbonate (TUMS -  DOSED IN MG ELEMENTAL CALCIUM) 500 MG chewable tablet Chew 1 tablet by mouth 2 (two) times daily.    Yes [provider]  carvedilol (COREG) 3.125 MG tablet Take 1 tablet (3.125 mg total) by mouth in the morning and at bedtime. 03/16/20  Yes Satira Sark, MD  cholecalciferol (VITAMIN D) 1000 units tablet Take 1,000 Units by mouth daily.   Yes [provider]  famotidine  (PEPCID) 20 MG tablet Take 1 tablet (20 mg total) by mouth 2 (two) times daily. 12/06/19  Yes [name], [name], DO  ferrous sulfate 325 (65 FE) MG tablet Take 325 mg by mouth 2 (two) times a week. Mondays  and Thursdays.   Yes [provider]  hydrOXYzine (ATARAX/VISTARIL) 10 MG tablet Take 1 tablet (10 mg total) by mouth at bedtime. 12/06/19  Yes [name], [name], DO  levocetirizine (XYZAL) 5 MG tablet Take 1 tablet (5 mg total) by mouth every evening. 12/06/19  Yes [name], [name], DO  omeprazole (PRILOSEC) 40 MG capsule Take 1 capsule (40 mg total) by mouth daily. 12/06/19  Yes [name], [name], DO  ondansetron (ZOFRAN ODT) 4 MG disintegrating tablet Take 1 tablet (4 mg total) by mouth every 8 (eight) hours as needed for nausea or vomiting. 12/04/19  Yes Rai, Ripudeep K, MD  pantoprazole (PROTONIX) 40 MG tablet Take 40 mg by mouth daily. 03/03/20  Yes [provider]  pravastatin (PRAVACHOL) 20 MG tablet Take 1 tablet (20 mg total) by mouth every evening. 02/18/20  Yes Strader, [place] M, PA-C  rivaroxaban (XARELTO) 20 MG TABS tablet Take 1 tablet daily with evening meal Patient taking differently: Take 20 mg by mouth daily with supper.  09/25/19  Yes Satira Sark, MD  tamsulosin (FLOMAX) 0.4 MG CAPS capsule Take 1 capsule (0.4 mg total) by mouth daily. 03/03/20  Yes [name], [name], DO  traMADol (ULTRAM) 50 MG tablet TAKE 1 TO 2 TABLETS BY MOUTH EVERY 8 HOURS AS NEEDED FOR MODERATE PAIN Patient taking differently: Take 50-100 mg by mouth every 8 (eight) hours as needed for moderate pain.  03/09/20  Yes [name], [name], DO  vitamin B-12 (CYANOCOBALAMIN) 100 MCG tablet Take 100 mcg by mouth daily.   Yes [provider]     Family History  Problem Relation Age of Onset  . Heart attack Father   . Hypertension Mother   . Hyperlipidemia Mother   . Diabetes type II Mother   . Urticaria Mother   . Hypertension Brother   . Breast cancer Cousin 4  . Immunodeficiency Neg Hx     . Eczema Neg Hx   . Atopy Neg Hx   . Asthma Neg Hx   . Angioedema Neg Hx   . Allergic rhinitis Neg Hx   . Colon cancer Neg Hx   . Esophageal cancer Neg Hx   . Stomach cancer Neg Hx   . Pancreatic cancer Neg Hx   . Colon polyps Neg Hx   . Esophageal varices Neg Hx   . Inflammatory bowel disease Neg Hx   . Rectal cancer Neg Hx     Social History   Socioeconomic History  . Marital status: Married    Spouse name: Not on file  . Number of children: 2  . Years of education: Not on file  . Highest education level: Not on file  Occupational History  . Not on file  Tobacco Use  . Smoking status: Current Some Day Smoker  Packs/day: 0.10    Years: 37.00    Pack years: 3.70    Types: Cigarettes  . Smokeless tobacco: Never Used  Vaping Use  . Vaping Use: Never used  Substance and Sexual Activity  . Alcohol use: No  . Drug use: No  . Sexual activity: Yes    Partners: Male  Other Topics Concern  . Not on file  Social History Narrative   Married. 2 children.    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    Walks with cane or wheelchair.    Former smoker.    Drinks caffeine.    Smoke alarm in the home.   Firearms in the home.    Wears her seatbelt.    Feels safe In her relationships.    Social Determinants of Health   Financial Resource Strain:   . Difficulty of Paying Living Expenses:   Food Insecurity:   . Worried About Charity fundraiser in the Last Year:   . Arboriculturist in the Last Year:   Transportation Needs:   . Film/video editor (Medical):   [name] Lack of Transportation (Non-Medical):   Physical Activity:   . Days of Exercise per Week:   . Minutes of Exercise per Session:   Stress:   . Feeling of Stress :   Social Connections:   . Frequency of Communication with Friends and Family:   . Frequency of Social Gatherings with Friends and Family:   . Attends Religious Services:   . Active Member of Clubs or Organizations:   . Attends Archivist  Meetings:   [name] Marital Status:     Review of Systems: A 12 point ROS discussed and pertinent positives are indicated in the HPI above.  All other systems are negative.  Review of Systems  Constitutional: Positive for appetite change and fatigue.  Respiratory: Negative for cough and shortness of breath.   Cardiovascular: Negative for chest pain and leg swelling.  Gastrointestinal: Positive for abdominal pain. Negative for abdominal distention.  Musculoskeletal: Positive for arthralgias. Negative for back pain.       Right hip occasionally  Neurological: Negative for headaches.    Vital Signs: BP 122/86 (BP Location: Left Arm)   Pulse 91   Temp 97.6 F (36.4 C) (Oral)   Resp 16   Ht 5' (1.524 m)   Wt 114 lb 13.8 oz (52.1 kg)   LMP  (LMP Unknown) Comment: at age 46  SpO2 97%   BMI 22.43 kg/m   Physical Exam Constitutional:      General: She is not in acute distress.    Appearance: She is well-developed.  Cardiovascular:     Rate and Rhythm: Normal rate and regular rhythm.     Heart sounds: Normal heart sounds.  Pulmonary:     Effort: Pulmonary effort is normal.     Breath sounds: Normal breath sounds.  Abdominal:     General: Bowel sounds are normal.     Palpations: Abdomen is soft.     Tenderness: There is abdominal tenderness in the right upper quadrant.  Skin:    General: Skin is warm and dry.  Neurological:     Mental Status: She is alert and oriented to person, place, and time.  Psychiatric:        Mood and Affect: Mood is anxious.     Imaging: CT ABDOMEN PELVIS W CONTRAST  Result Date: 03/17/2020 CLINICAL DATA:  Acute generalized abdominal pain. EXAM: CT ABDOMEN AND PELVIS  WITH CONTRAST TECHNIQUE: Multidetector CT imaging of the abdomen and pelvis was performed using the standard protocol following bolus administration of intravenous contrast. CONTRAST:  135mL OMNIPAQUE IOHEXOL 300 MG/ML  SOLN COMPARISON:  March 02, 2020.  Jan 31, 2020.  March 5th 2021. FINDINGS:  Lower chest: No acute abnormality. Hepatobiliary: No focal liver abnormality is seen. No gallstones, gallbladder wall thickening, or biliary dilatation. Pancreas: No ductal dilatation is noted. However, rounded low density is noted in the pancreatic head which appears to be enlarged compared to prior exam, currently measuring 31 x 26 mm. 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 Clinical and laboratory correlation is recommended. Spleen: Normal in size without focal abnormality. Adrenals/Urinary Tract: Adrenal glands appear normal. Small left renal cysts are noted. No hydronephrosis or renal obstruction is noted. No renal or ureteral calculi are noted. Urinary bladder is unremarkable. Stomach/Bowel: Stomach is within normal limits. Appendix appears normal. No evidence of bowel wall thickening, distention, or inflammatory changes. Vascular/Lymphatic: Grossly stable 3.3 cm infrarenal abdominal aortic aneurysm is noted. No adenopathy is noted. Reproductive: Uterus and bilateral adnexa are unremarkable. Other: No abdominal wall hernia or abnormality. No abdominopelvic ascites. Musculoskeletal: There appears to be lytic destruction involving the greater trochanter of the proximal right femur with associated pathologic fracture. This is concerning for possible malignancy or metastatic disease. IMPRESSION: 1. There appears to be lytic destruction involving the greater trochanter of the proximal right femur with associated pathologic fracture. This is concerning for possible malignancy or metastatic disease. MRI or bone scan is recommended for further evaluation. 2. Rounded low density is noted in the pancreatic head which appears to be enlarged compared to prior exam, currently measuring 31 x 26 mm. 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 Clinical and laboratory correlation is  recommended, and MRI also should be considered to rule out potential neoplasm or malignancy. 3. Grossly stable 3.3 cm infrarenal abdominal aortic aneurysm. Recommend followup by ultrasound in 3 years. This recommendation follows ACR consensus guidelines: White Paper of the ACR Incidental Findings Committee II on Vascular Findings. J Am Coll Radiol 2013; 10:789-794. Aortic Atherosclerosis (ICD10-I70.0). Electronically Signed   By: Marijo Conception M.D.   On: 03/17/2020 11:33   MR FEMUR RIGHT W WO CONTRAST  Result Date: 03/20/2020 CLINICAL DATA:  Evaluate greater trochanteric mass EXAM: MRI OF THE RIGHT FEMUR WITHOUT AND WITH CONTRAST TECHNIQUE: Multiplanar, multisequence MR imaging of the right hip was performed both before and after administration of intravenous contrast. CONTRAST:  41mL GADAVIST GADOBUTROL 1 MMOL/ML IV SOLN COMPARISON:  CT 03/17/2020 FINDINGS: Bones/Joint/Cartilage T1 hypointense, T2 heterogeneously hyperintense marrow replacing lesion centered at the greater trochanter of the proximal right femur measuring approximately 4.0 x 2.5 x 2.5 cm (series 40, image 11; series 5, image 11). Mass demonstrates enhancement on postcontrast imaging with central area of non enhancement which may reflect necrosis. Comminuted pathologic fracture of the more superior aspect of the greater trochanter, better visualized on recent CT. There is edema and enhancement within the posterior and posteromedial soft tissues adjacent to the lesion which may reflect tumor extension and/or reactive changes secondary to the adjacent fracture (series 39, image 13). Right femoral head is intact without dislocation. No femoral head avascular necrosis. No additional marrow replacing lesions. No hip joint effusion. Ligaments Intact. Muscles and Tendons No acute myotendinous abnormality. Soft tissues No right inguinal lymphadenopathy. No soft  tissue fluid collections. IMPRESSION: Enhancing marrow replacing lesion within the greater  trochanter of the proximal right femur measuring up to 4.0 cm with associated pathologic fracture. Edema and enhancement within the posterior and posteromedial soft tissues adjacent to the lesion may reflect tumor extension and/or reactive changes secondary to the adjacent fracture. Imaging features are nonspecific with primary differential considerations including metastatic disease and myeloma. Consider nuclear medicine bone scan to evaluate for the presence of any additional lesions. Electronically Signed   By: Davina Poke D.O.   On: 03/20/2020 08:21   MR 3D Recon At Scanner  Result Date: 03/20/2020 CLINICAL DATA:  Pancreatic pseudocyst. EXAM: MRI ABDOMEN WITHOUT AND WITH CONTRAST (INCLUDING MRCP) TECHNIQUE: Multiplanar multisequence MR imaging of the abdomen was performed both before and after the administration of intravenous contrast. Heavily T2-weighted images of the biliary and pancreatic ducts were obtained, and three-dimensional MRCP images were rendered by post processing. CONTRAST:  82mL GADAVIST GADOBUTROL 1 MMOL/ML IV SOLN COMPARISON:  CT SCAN 03/17/2020.  MRI 11/07/2019. FINDINGS: Lower chest: UNREMARKABLE. Hepatobiliary: 8 mm T2 hyperintensity identified in the lateral segment left liver is stable since previous MRI. Postcontrast imaging suggests cavernous hemangioma as etiology. There is no evidence for gallstones, gallbladder wall thickening, or pericholecystic fluid. No intrahepatic or extrahepatic biliary dilation. Pancreas: As noted on recent CT scan, there is a 2.5 x 2.4 cm complex fluid collection in the head of pancreas subtle rim enhancement without evidence for internal architecture/enhancement. Lesion shows no appreciable diffusion restriction. No associated dilatation of the main pancreatic duct. Pancreatic tissue enhances throughout. There is some subtle edema in the peripancreatic fat around the head of pancreas. Spleen:  No splenomegaly. No focal mass lesion. Adrenals/Urinary  Tract: Bilateral thickening in the adrenal glands is compatible with hyperplasia. Tiny cortical cysts noted in both kidneys no suspicious enhancing renal lesion. No hydronephrosis. Stomach/Bowel: Stomach is unremarkable. No gastric wall thickening. No evidence of outlet obstruction. Duodenum is normally positioned as is the ligament of Treitz. No small bowel or colonic dilatation within the visualized abdomen. Vascular/Lymphatic: Infrarenal abdominal aortic aneurysm measures up to 3.2 cm diameter. There is no gastrohepatic or hepatoduodenal ligament lymphadenopathy. No retroperitoneal or mesenteric lymphadenopathy. Other:  No intraperitoneal free fluid. Musculoskeletal: No suspicious marrow enhancement within the visualized bony anatomy. IMPRESSION: 1. No substantial change in the complex cystic pancreatic head lesion noted on CT scan 2 days ago. This does not appear to be a duodenal diverticulum. Rapid interval appearance again suggests pseudocyst in this patient with a history of pancreatitis. No mural nodularity or central enhancement. Follow-up MRI in 3 months recommended to ensure resolution. 2. Trace edema noted around the head of pancreas. 3. 3.2 cm abdominal aortic aneurysm. Recommend followup by ultrasound in 3 years. This recommendation follows ACR consensus guidelines: White Paper of the ACR Incidental Findings Committee II on Vascular Findings. [facility] Coll Radiol 2013; 10:789-794 Electronically Signed   By: Misty Stanley M.D.   On: 03/20/2020 08:04   DG Chest Port 1 View  Result Date: 03/17/2020 CLINICAL DATA:  Sepsis. EXAM: PORTABLE CHEST 1 VIEW COMPARISON:  None. FINDINGS: The heart size and mediastinal contours are within normal limits. Sternotomy wires are noted. No pneumothorax or pleural effusion is noted. Both lungs are clear. The visualized skeletal structures are unremarkable. IMPRESSION: No active disease. Electronically Signed   By: Marijo Conception M.D.   On: 03/17/2020 08:28   MR ABDOMEN  MRCP W WO CONTAST  Result Date: 03/20/2020 CLINICAL DATA:  Pancreatic pseudocyst.  EXAM: MRI ABDOMEN WITHOUT AND WITH CONTRAST (INCLUDING MRCP) TECHNIQUE: Multiplanar multisequence MR imaging of the abdomen was performed both before and after the administration of intravenous contrast. Heavily T2-weighted images of the biliary and pancreatic ducts were obtained, and three-dimensional MRCP images were rendered by post processing. CONTRAST:  88mL GADAVIST GADOBUTROL 1 MMOL/ML IV SOLN COMPARISON:  CT SCAN 03/17/2020.  MRI 11/07/2019. FINDINGS: Lower chest: UNREMARKABLE. Hepatobiliary: 8 mm T2 hyperintensity identified in the lateral segment left liver is stable since previous MRI. Postcontrast imaging suggests cavernous hemangioma as etiology. There is no evidence for gallstones, gallbladder wall thickening, or pericholecystic fluid. No intrahepatic or extrahepatic biliary dilation. Pancreas: As noted on recent CT scan, there is a 2.5 x 2.4 cm complex fluid collection in the head of pancreas subtle rim enhancement without evidence for internal architecture/enhancement. Lesion shows no appreciable diffusion restriction. No associated dilatation of the main pancreatic duct. Pancreatic tissue enhances throughout. There is some subtle edema in the peripancreatic fat around the head of pancreas. Spleen:  No splenomegaly. No focal mass lesion. Adrenals/Urinary Tract: Bilateral thickening in the adrenal glands is compatible with hyperplasia. Tiny cortical cysts noted in both kidneys no suspicious enhancing renal lesion. No hydronephrosis. Stomach/Bowel: Stomach is unremarkable. No gastric wall thickening. No evidence of outlet obstruction. Duodenum is normally positioned as is the ligament of Treitz. No small bowel or colonic dilatation within the visualized abdomen. Vascular/Lymphatic: Infrarenal abdominal aortic aneurysm measures up to 3.2 cm diameter. There is no gastrohepatic or hepatoduodenal ligament lymphadenopathy. No  retroperitoneal or mesenteric lymphadenopathy. Other:  No intraperitoneal free fluid. Musculoskeletal: No suspicious marrow enhancement within the visualized bony anatomy. IMPRESSION: 1. No substantial change in the complex cystic pancreatic head lesion noted on CT scan 2 days ago. This does not appear to be a duodenal diverticulum. Rapid interval appearance again suggests pseudocyst in this patient with a history of pancreatitis. No mural nodularity or central enhancement. Follow-up MRI in 3 months recommended to ensure resolution. 2. Trace edema noted around the head of pancreas. 3. 3.2 cm abdominal aortic aneurysm. Recommend followup by ultrasound in 3 years. This recommendation follows ACR consensus guidelines: White Paper of the ACR Incidental Findings Committee II on Vascular Findings. [facility] Coll Radiol 2013; 10:789-794 Electronically Signed   By: Misty Stanley M.D.   On: 03/20/2020 08:04   CT RENAL STONE STUDY  Result Date: 03/03/2020 CLINICAL DATA:  Flank pain. Recent hematuria. Nephrolithiasis. Abdominal aortic aneurysm. EXAM: CT ABDOMEN AND PELVIS WITHOUT CONTRAST TECHNIQUE: Multidetector CT imaging of the abdomen and pelvis was performed following the standard protocol without IV contrast. COMPARISON:  01/31/2020, 11/29/2019, and 10/11/2019 from [facility] FINDINGS: Lower chest: No acute findings. Stable focal pleural-parenchymal scarring in lateral right lower lobe. Hepatobiliary: No mass visualized on this unenhanced exam. Tiny calcified gallstones again noted, however there is no evidence of cholecystitis or biliary ductal dilatation. Pancreas: Previous changes of acute pancreatitis have resolved since prior exam. A persistent slightly low-attenuation mass is seen in the pancreatic head which is Lex conspicuous than on previous contrast enhanced exam, the measures approximately 2.5 x 1.9 cm. No evidence of pancreatic calcifications or ductal dilatation. Spleen:  Within normal limits in  size. Adrenals/Urinary tract: Bilateral renal vascular calcification is again seen, but no definite intrarenal calculi are seen. 5 mm calculus has migrated to the distal right ureter, near the right UVJ, however there is no significant right-sided hydroureteronephrosis. No other ureteral calculi identified. Stomach/Bowel: No evidence of obstruction, inflammatory process, or abnormal fluid  collections. Normal appendix visualized. Vascular/Lymphatic: No pathologically enlarged lymph nodes identified. 3.2 cm infrarenal abdominal aortic aneurysm shows no significant change compared to prior study. Reproductive:  No mass or other significant abnormality. Other:  Pelvic floor laxity with cystocele again noted. Musculoskeletal:  No suspicious bone lesions identified. IMPRESSION: 1. Distal migration of 5 mm right ureteral calculus to the distal ureter near the right UVJ, without significant right-sided hydroureteronephrosis. 2. Stable 3.2 cm infrarenal abdominal aortic aneurysm. Recommend followup by ultrasound in 3 years. This recommendation follows ACR consensus guidelines: White Paper of the ACR Incidental Findings Committee II on Vascular Findings. J Am Coll Radiol 2013; 35:670-141 3. Cholelithiasis. No radiographic evidence of cholecystitis. 4. Pelvic floor laxity with cystocele. 5. Persistent abnormal soft tissue density mass in pancreatic head, less well visualized compared to previous contrast enhanced exam. Recommend abdomen MRI without and with contrast for further characterization. Electronically Signed   By: Marlaine Hind M.D.   On: 03/03/2020 08:45   CUP PACEART REMOTE DEVICE CHECK  Result Date: 02/25/2020 Carelink summary report received. Battery status OK. Normal device function. No new symptom episodes, tachy episodes, brady, or pause episodes. No new AF episodes. Monthly summary reports and ROV/PRN- JBox, RN/CVRS   Labs:  CBC: Recent Labs    03/17/20 0231 03/18/20 0548 03/19/20 0504  03/20/20 0442  WBC 16.8* 15.7* 8.1 7.5  HGB 15.4* 15.9* 12.5 12.2  HCT 47.5* 48.1* 38.5 38.5  PLT 377 362 300 279    COAGS: Recent Labs    12/02/19 2028 03/17/20 0817  INR  --  1.2  APTT 36 40*    BMP: Recent Labs    03/18/20 0548 03/19/20 0504 03/20/20 0442 03/21/20 0508  NA 132* 139 140 136  K 3.7 3.4* 3.7 3.6  CL 104 107 107 104  CO2 21* 25 27 25   GLUCOSE 83 76 83 91  BUN 10 10 7  <5*  CALCIUM 8.5* 8.3* 8.3* 8.5*  CREATININE 0.47 0.46 0.55 0.45  GFRNONAA >60 >60 >60 >60  GFRAA >60 >60 >60 >60    LIVER FUNCTION TESTS: Recent Labs    03/17/20 0231 03/18/20 0548 03/20/20 0442 03/21/20 0508  BILITOT 0.4 0.5 0.5 0.6  AST 19 20 15 15   ALT 15 13 11 11   ALKPHOS 70 62 42 48  PROT 7.2 6.2* 4.9* 5.7*  ALBUMIN 4.2 3.2* 2.8* 3.0*    TUMOR MARKERS: No results for input(s): AFPTM, CEA, CA199, CHROMGRNA in the last 8760 hours.  Assessment and Plan:  Right femur lesion: Serita G. Leeson, 59 year old female, presented to the ED with abdominal pain and right hip pain. 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 She is scheduled for a CT-guided bone lesion biopsy on Monday, 03/23/20.  Risks and benefits of a bone lesion biopsy were discussed with the patient and/or patient's family including, but not limited to bleeding, infection, damage to adjacent structures or low yield requiring additional tests.  All of the questions were answered and there is agreement to proceed.  Consent signed and in chart.  Thank you for this interesting consult.  I greatly enjoyed meeting [employer] and look forward to participating in their care.  A copy of this report was sent to the requesting provider on this date.  Electronically Signed: Soyla Dryer, AGACNP-BC [phone] 03/21/2020, 11:13 AM   I spent a total of 20 Minutes  in face to face in clinical consultation, greater than 50% of which was counseling/coordinating care for  CT-guided  right femur lesion biopsy.

## 2020-03-22 LAB — BASIC METABOLIC PANEL
Anion gap: 10 (ref 5–15)
BUN: <5 mg/dL — ABNORMAL LOW (ref 6–20)
CO2: 25 mmol/L (ref 22–32)
Calcium: 8.5 mg/dL — ABNORMAL LOW (ref 8.9–10.3)
Chloride: 108 mmol/L (ref 98–111)
Creatinine, Ser: 0.49 mg/dL (ref 0.44–1.00)
GFR calc Af Amer: >60 mL/min (ref >60)
GFR calc non Af Amer: >60 mL/min (ref >60)
Glucose, Bld: 85 mg/dL (ref 70–99)
Potassium: 3.5 mmol/L (ref 3.5–5.1)
Sodium: 143 mmol/L (ref 135–145)

## 2020-03-22 LAB — CULTURE, BLOOD (ROUTINE X 2)
Culture: NO GROWTH
Culture: NO GROWTH

## 2020-03-22 LAB — CBC
HCT: 40.9 % (ref 36.0–46.0)
Hemoglobin: 13.1 g/dL (ref 12.0–15.0)
MCH: 31.3 pg (ref 26.0–34.0)
MCHC: 32 g/dL (ref 30.0–36.0)
MCV: 97.6 fL (ref 80.0–100.0)
Platelets: 307 10*3/uL (ref 150–400)
RBC: 4.19 MIL/uL (ref 3.87–5.11)
RDW: 13.2 % (ref 11.5–15.5)
WBC: 8 10*3/uL (ref 4.0–10.5)
nRBC: 0 % (ref 0.0–0.2)

## 2020-03-22 LAB — LIPASE, BLOOD: Lipase: 173 U/L — ABNORMAL HIGH (ref 11–51)

## 2020-03-22 NOTE — Progress Notes (Signed)
PROGRESS NOTE    [name]    Code Status: Full Code  CWC:[id number] DOB: 04-Jan-1961 DOA: 03/17/2020 LOS: 5 days  PCP: [name], DO CC:  Chief Complaint  Patient presents with  . Abdominal Pain       Hospital Summary   This is a 59 year old female with history of recurrent idiopathic pancreatitis, paroxysmal A. fib, CAD s/p CABG, hypertension, anxiety, recent CVA, recent fall in March, AAA who presented to the ED on 6/22 with persistent epigastric abdominal pain with associated nausea and vomiting.  Had been taking Flomax in order to pass a kidney stone recently and was having some hematuria but this had resolved.  ED Course: Afebrile, mildly tachycardic, BP 136/96.  CBC notable for leukocytosis 16.8, hemoconcentration with hemoglobin 15.4.  BMP was notable for potassium of 3.0 and glucose 138.  Lipase elevated 6271.  Urinalysis was consistent with infection and reflex to culture.  Chest x-ray negative. GI was consulted for recurrent pancreatitis.    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  6/24:  - EGD/EUS with Dr. Ardis Hughs.  Abnormal head of pancreas without discrete solid or anechoic cystic lesions.  - FNA performed and preliminary review showing no neoplastic cells.  CBD normal and nondilated.   - GI recommended interval imaging of the pancreas with MRI in 2 to 3 months as well as general surgery consult to consider cholecystectomy. - General surgery recommended Cardiology consult for cardiac clearance  6/25:  - cardiology cleared patient for cholecystectomy -General surgery holding off on cholecystectomy until resolution of lipase and  work-up of femur lesion - MRI right femur: Enhancing marrow replacing lesion within the greater trochanter of the proximal right femur measuring up to 4.0 cm with associated pathologic fracture. Edema and enhancement within the posterior and posteromedial soft tissues adjacent to the lesion may reflect tumor extension and/or reactive changes secondary to the adjacent fracture.  - Orthopedic surgery consulted for bone biopsy and eval of fracture -not comfortable with biopsy of this lesion and recommending IR consult and patient is not a candidate for fracture repair  6/26:  -IR consulted for right femur biopsy    A & P   Principal Problem:   Acute on chronic pancreatitis ([place]) Active Problems:   Cerebrovascular accident (CVA) due to occlusion of left middle cerebral artery (HCC)   Hyperlipidemia LDL goal <70   Hypokalemia   CAD in native artery   Benign essential HTN   Chronic diastolic CHF (congestive heart failure) (HCC)   Paroxysmal atrial fibrillation ([place])   Anxiety   Recurrent pancreatitis   Preoperative evaluation of a medical condition to rule out surgical contraindications (TAR required)   Pure hypercholesterolemia   1. Recurrent acute on chronic idiopathic pancreatitis with abnormal finding of pancreatic head s/p EGD/EUS/FNA with Dr. Ardis Hughs on 6/24  a. S/p EUS with FNA in March 2021 with atypical cells thought to be inflammatory at the time.  Sludge noted in gallbladder.  CT scan in May showed improved inflammatory changes and decreased size of suspected pseudocyst b. Lipase improved, not resolved c. EGD/EUS with Dr. Ardis Hughs 6/24:.  Abnormal head of pancreas without discrete solid or anechoic cystic lesions.  FNA performed. CBD normal and nondilated.  GI recommended interval imaging of the pancreas with MRI in 2 to 3 months. d. Final pathology from FNA-no malignant cells e. MRI abdomen/MRCP-similar to prior scans  2. Biliary sludge  Cholelithiasis a. General surgery  consulted to evaluate for cholecystectomy in setting of recurrent pancreatitis and biliary sludge-awaiting FNA results from EUS.  Cardiology consulted for risk stratification.  Continue Actigall to address biliary sludge/small gallstones.  Sludge/gallstones could possibly be the cause of pancreatitis but would entertain cholecystectomy after further work-up and resolution of abdominal pain/normalization of lipase (currently 173)  3. 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 No known history of cancer but has had significant weight loss in the past few months b. MRI with and without contrast: Enhancing marrow replacing lesion within the greater trochanter of the proximal right femur measuring up to 4.0 cm with associated pathologic fracture. Edema and enhancement within the posterior and posteromedial soft tissues adjacent to the lesion may reflect tumor extension and/or reactive changes secondary to the adjacent fracture.  c. nuclear med bone scan to evaluate for the presence of any additional lesions d. Orthopedic surgery consulted to evaluate pathologic fracture and bone biopsy -recommended IR biopsy and not a candidate for fracture repair e. IR consulted - CT guided biopsy Monday  4. Reactive leukocytosis, resolved  5. Tick removed from posterior neck a. Unknown duration b. 6/23 incidental finding on the posterior neck by nursing staff.  Removed.  Unfortunately no picture was obtained.  Nurse stated tick was not engorged c. No sign of tickborne illness, will hold off on empiric treatment  6. Asymptomatic E. coli UTI a. Hold antibiotics for now.  Will start if she becomes febrile or has symptoms  7. History of nephrolithiasis, resolved a. On Flomax  8. Hypotension Resolved  9. History of gastritis/duodenitis On PPI  10. Hyperlipidemia not on medication  11. Hypertension  a. Continue low-dose Coreg for  now  12. Chronic diastolic heart failure, not in acute exacerbation a. Will monitor for volume overload as she is getting high volumes of fluids pancreatitis b. Daily weights and I/O  13. Paroxysmal A. fib on Xarelto and Coreg a. Hold Xarelto as above -pending GI recommendations  14. Anxiety stable on home Vistaril  15. Stable infrarenal AAA a. Repeat ultrasound in 3 years for surveillance  DVT prophylaxis: Place and maintain sequential compression device Start: 03/18/20 1526 Place and maintain sequential compression device Start: 03/18/20 1526   Family Communication: patient to update family  Disposition Plan:  Status is: Inpatient  Remains inpatient appropriate because:IV treatments appropriate due to intensity of illness or inability to take PO and Inpatient level of care appropriate due to severity of illness   Dispo: The patient is from: Home              Anticipated d/c is to: Home              Anticipated d/c date is: > 3 days              Patient currently is not medically stable to d/c.          Pressure injury documentation    None  Consultants  GI  General surgery Cardiology Orthopedic surgery IR  Procedures  EUS/ERCP/FNA 6/24  Antibiotics   Anti-infectives (From admission, onward)   Start     Dose/Rate Route Frequency Ordered Stop   03/17/20 1100  cefTRIAXone (ROCEPHIN)  1 g in sodium chloride 0.9 % 100 mL IVPB        1 g 200 mL/hr over 30 Minutes Intravenous  Once 03/17/20 1046 03/17/20 1551        Subjective   Patient seen and examined at bedside no acute distress and resting comfortably.  No events overnight.  Tolerating diet.  Denies any chest pain, shortness of breath, fever, nausea, vomiting, urinary complaints.    Otherwise ROS negative  Objective   Vitals:   03/21/20 1332 03/21/20 2205 03/22/20 0517 03/22/20 1328  BP: 127/76 121/71 (!) 141/81 (!) 147/83  Pulse: 84 78 86 85  Resp: 14 18 18 14   Temp: 97.7 F (36.5 C) 97.8  F (36.6 C) 97.6 F (36.4 C) 98 F (36.7 C)  TempSrc: Oral   Oral  SpO2: 100% 97% 98% 100%  Weight:      Height:        Intake/Output Summary (Last 24 hours) at 03/22/2020 1610 Last data filed at 03/22/2020 1400 Gross per 24 hour  Intake 2974.13 ml  Output --  Net 2974.13 ml   Filed Weights   03/17/20 0658 03/19/20 1032 03/20/20 0607  Weight: 52.6 kg 52.6 kg 52.1 kg    Examination:  Physical Exam Vitals and nursing note reviewed.  Constitutional:      Appearance: Normal appearance.  HENT:     Head: Normocephalic and atraumatic.  Eyes:     Conjunctiva/sclera: Conjunctivae normal.  Cardiovascular:     Rate and Rhythm: Normal rate and regular rhythm.  Pulmonary:     Effort: Pulmonary effort is normal.     Breath sounds: Normal breath sounds.  Abdominal:     General: Abdomen is flat.     Palpations: Abdomen is soft.     Tenderness: There is abdominal tenderness in the epigastric area.  Musculoskeletal:        General: No swelling or tenderness.  Skin:    Coloration: Skin is not jaundiced or pale.  Neurological:     Mental Status: She is alert. Mental status is at baseline.  Psychiatric:        Mood and Affect: Mood normal.        Behavior: Behavior normal.     Data Reviewed: I have personally reviewed following labs and imaging studies  CBC: Recent Labs  Lab 03/17/20 0231 03/18/20 0548 03/19/20 0504 03/20/20 0442 03/22/20 0541  WBC 16.8* 15.7* 8.1 7.5 8.0  HGB 15.4* 15.9* 12.5 12.2 13.1  HCT 47.5* 48.1* 38.5 38.5 40.9  MCV 98.8 95.4 95.8 98.2 97.6  PLT 377 362 300 279 967   Basic Metabolic Panel: Recent Labs  Lab 03/17/20 0231 03/17/20 0817 03/18/20 0548 03/19/20 0504 03/20/20 0442 03/21/20 0508 03/22/20 0541  NA   < >  --  132* 139 140 136 143  K   < >  --  3.7 3.4* 3.7 3.6 3.5  CL   < >  --  104 107 107 104 108  CO2   < >  --  21* 25 27 25 25   GLUCOSE   < >  --  83 76 83 91 85  BUN   < >  --  10 10 7  <5* <5*  CREATININE   < >  --  0.47  0.46 0.55 0.45 0.49  CALCIUM   < >  --  8.5* 8.3* 8.3* 8.5* 8.5*  MG  --  2.2 2.0  --   --   --   --    < > =  values in this interval not displayed.   GFR: Estimated Creatinine Clearance: 54.4 mL/min (by C-G formula based on SCr of 0.49 mg/dL). Liver Function Tests: Recent Labs  Lab 03/17/20 0231 03/18/20 0548 03/20/20 0442 03/21/20 0508  AST 19 20 15 15   ALT 15 13 11 11   ALKPHOS 70 62 42 48  BILITOT 0.4 0.5 0.5 0.6  PROT 7.2 6.2* 4.9* 5.7*  ALBUMIN 4.2 3.2* 2.8* 3.0*   Recent Labs  Lab 03/17/20 0231 03/20/20 0442 03/21/20 0508 03/22/20 0541  LIPASE 6,271* 189* 208* 173*   No results for input(s): AMMONIA in the last 168 hours. Coagulation Profile: Recent Labs  Lab 03/17/20 0817  INR 1.2   Cardiac Enzymes: No results for input(s): CKTOTAL, CKMB, CKMBINDEX, TROPONINI in the last 168 hours. BNP (last 3 results) No results for input(s): PROBNP in the last 8760 hours. HbA1C: No results for input(s): HGBA1C in the last 72 hours. CBG: No results for input(s): GLUCAP in the last 168 hours. Lipid Profile: No results for input(s): CHOL, HDL, LDLCALC, TRIG, CHOLHDL, LDLDIRECT in the last 72 hours. Thyroid Function Tests: No results for input(s): TSH, T4TOTAL, FREET4, T3FREE, THYROIDAB in the last 72 hours. Anemia Panel: No results for input(s): VITAMINB12, FOLATE, FERRITIN, TIBC, IRON, RETICCTPCT in the last 72 hours. Sepsis Labs: Recent Labs  Lab 03/17/20 0817  LATICACIDVEN 1.1    Recent Results (from the past 240 hour(s))  Blood Culture (routine x 2)     Status: None   Collection Time: 03/17/20  8:17 AM   Specimen: BLOOD LEFT HAND  Result Value Ref Range Status   Specimen Description   Final    BLOOD LEFT HAND Performed at [facility], [place] [address], [place], [place] [address]    Special Requests   Final    BOTTLES DRAWN AEROBIC AND ANAEROBIC Blood Culture results may not be optimal due to an inadequate volume of blood received in  culture bottles Performed at [facility] [address], [place], [place] [address]    Culture   Final    NO GROWTH 5 DAYS Performed at [facility], [place] [address], [place], [place] [address]    Report Status 03/22/2020 FINAL  Final  Blood Culture (routine x 2)     Status: None   Collection Time: 03/17/20  9:14 AM   Specimen: BLOOD  Result Value Ref Range Status   Specimen Description   Final    BLOOD RIGHT ANTECUBITAL Performed at [facility] [address] [place], [place] [address]    Special Requests   Final    BOTTLES DRAWN AEROBIC AND ANAEROBIC Blood Culture results may not be optimal due to an excessive volume of blood received in culture bottles Performed at [facility] [address] [place], [place] [address]    Culture   Final    NO GROWTH 5 DAYS Performed at [facility], [place] [address] [place], [place] [address]    Report Status 03/22/2020 FINAL  Final  Urine culture     Status: Abnormal   Collection Time: 03/17/20  9:48 AM   Specimen: Urine, Clean Catch  Result Value Ref Range Status   Specimen Description   Final    URINE, CLEAN CATCH Performed at [facility], [place] [address] [place], [place] [address]    Special Requests   Final    NONE Performed at [facility], [place] [address]  [place], [place] [address]    Culture >=100,000 COLONIES/mL ESCHERICHIA COLI (A)  Final   Report Status 03/19/2020 FINAL  Final   Organism ID, Bacteria ESCHERICHIA COLI (A)  Final      Susceptibility   Escherichia coli - MIC*    AMPICILLIN <=2 SENSITIVE Sensitive     CEFAZOLIN <=4 SENSITIVE Sensitive     CEFTRIAXONE <=0.25 SENSITIVE Sensitive     CIPROFLOXACIN <=0.25 SENSITIVE Sensitive     GENTAMICIN <=1 SENSITIVE Sensitive     IMIPENEM <=0.25 SENSITIVE Sensitive     NITROFURANTOIN <=16 SENSITIVE Sensitive     TRIMETH/SULFA <=20 SENSITIVE Sensitive      AMPICILLIN/SULBACTAM <=2 SENSITIVE Sensitive     PIP/TAZO <=4 SENSITIVE Sensitive     * >=100,000 COLONIES/mL ESCHERICHIA COLI  SARS Coronavirus 2 by RT PCR (hospital order, performed in [facility] hospital lab) Nasopharyngeal Nasopharyngeal Swab     Status: None   Collection Time: 03/17/20  9:58 PM   Specimen: Nasopharyngeal Swab  Result Value Ref Range Status   SARS Coronavirus 2 NEGATIVE NEGATIVE Final    Comment: (NOTE) SARS-CoV-2 target nucleic acids are NOT DETECTED.  The SARS-CoV-2 RNA is generally detectable in upper and lower respiratory specimens during the acute phase of infection. The lowest concentration of SARS-CoV-2 viral copies this assay can detect is 250 copies / mL. A negative result does not preclude SARS-CoV-2 infection and should not be used as the sole basis for treatment or other patient management decisions.  A negative result may occur with improper specimen collection / handling, submission of specimen other than nasopharyngeal swab, presence of viral mutation(s) within the areas targeted by this assay, and inadequate number of viral copies (<250 copies / mL). A negative result must be combined with clinical observations, patient history, and epidemiological information.  Fact Sheet for Patients:   StrictlyIdeas.no  Fact Sheet for Healthcare Providers: BankingDealers.co.za  This test is not yet approved or  cleared by the [place] FDA and has been authorized for detection and/or diagnosis of SARS-CoV-2 by FDA under an Emergency Use Authorization (EUA).  This EUA will remain in effect (meaning this test can be used) for the duration of the COVID-19 declaration under Section 564(b)(1) of the Act, 21 U.S.C. section 360bbb-3(b)(1), unless the authorization is terminated or revoked sooner.  Performed at [facility], [place] [address] [place], [place] [address]   Surgical pcr screen      Status: None   Collection Time: 03/19/20  3:20 AM   Specimen: Nasal Mucosa; Nasal Swab  Result Value Ref Range Status   MRSA, PCR NEGATIVE NEGATIVE Final   Staphylococcus aureus NEGATIVE NEGATIVE Final    Comment: (NOTE) The Xpert SA Assay (FDA approved for NASAL specimens in patients 33 years of age and older), is one component of a comprehensive surveillance program. It is not intended to diagnose infection nor to guide or monitor treatment. Performed at [facility], [place] [address] [place], [place] [address]          Radiology Studies: No results found.      Scheduled Meds: . baclofen  10 mg Oral BID  . calcium carbonate  1 tablet Oral BID  . carvedilol  3.125 mg Oral BID WC  . cetirizine  10 mg Oral Daily  . hydrOXYzine  10 mg Oral QHS  . pantoprazole  40 mg Oral Daily  . tamsulosin  0.4 mg Oral Daily  . ursodiol  300 mg Oral BID  Continuous Infusions: . lactated ringers 100 mL/hr at 03/22/20 9191     Time spent: 20 minutes with over 50% of the time coordinating the patient's care    Harold Hedge, DO [facility] Pager [phone]  Call night coverage person covering after 7pm

## 2020-03-23 ENCOUNTER — Inpatient Hospital Stay (HOSPITAL_COMMUNITY): Payer: 59

## 2020-03-23 ENCOUNTER — Encounter (HOSPITAL_COMMUNITY): Payer: Self-pay | Admitting: Internal Medicine

## 2020-03-23 DIAGNOSIS — M899 Disorder of bone, unspecified: Secondary | ICD-10-CM

## 2020-03-23 LAB — LIPASE, BLOOD: Lipase: 143 U/L — ABNORMAL HIGH (ref 11–51)

## 2020-03-23 MED ORDER — FENTANYL CITRATE (PF) 100 MCG/2ML IJ SOLN
INTRAMUSCULAR | Status: AC
  Filled 2020-03-23: qty 2

## 2020-03-23 MED ORDER — TRIAMCINOLONE 0.1 % CREAM:EUCERIN CREAM 1:1
TOPICAL_CREAM | Freq: Two times a day (BID) | CUTANEOUS | Status: DC | PRN
  Filled 2020-03-23 (×2): qty 1

## 2020-03-23 MED ORDER — MIDAZOLAM HCL 2 MG/2ML IJ SOLN
INTRAMUSCULAR | Status: AC
  Filled 2020-03-23: qty 4

## 2020-03-23 MED ORDER — NALOXONE HCL 0.4 MG/ML IJ SOLN
INTRAMUSCULAR | Status: AC
  Filled 2020-03-23: qty 1

## 2020-03-23 MED ORDER — MIDAZOLAM HCL 2 MG/2ML IJ SOLN
INTRAMUSCULAR | Status: AC | PRN
  Administered 2020-03-23: 0.5 mg via INTRAVENOUS
  Administered 2020-03-23: 1 mg via INTRAVENOUS
  Administered 2020-03-23: 0.5 mg via INTRAVENOUS

## 2020-03-23 MED ORDER — FLUMAZENIL 0.5 MG/5ML IV SOLN
INTRAVENOUS | Status: AC
  Filled 2020-03-23: qty 5

## 2020-03-23 MED ORDER — TECHNETIUM TC 99M MEDRONATE IV KIT
20.0000 | PACK | Freq: Once | INTRAVENOUS | Status: AC | PRN
  Administered 2020-03-23: 20.9 via INTRAVENOUS

## 2020-03-23 MED ORDER — FENTANYL CITRATE (PF) 100 MCG/2ML IJ SOLN
INTRAMUSCULAR | Status: AC | PRN
  Administered 2020-03-23: 50 ug via INTRAVENOUS

## 2020-03-23 NOTE — Procedures (Signed)
Interventional Radiology Procedure:   Indications: Fracture of right femoral greater trochanter and concern for a pathologic fracture  Procedure: CT guided biopsy of right femoral greater trochanter  Findings: Small cores obtained from soft tissue component  Complications: None     EBL: less than 10 ml  Plan: Bedrest 1 hour   Lanetta Figuero R. Anselm Pancoast, MD  Pager: [phone]

## 2020-03-23 NOTE — Progress Notes (Signed)
PROGRESS NOTE    [name]    Code Status: Full Code  PXT:[id number] DOB: [birth year] DOA: 03/17/2020 LOS: 6 days  PCP: [name], DO CC:  Chief Complaint  Patient presents with   Abdominal Pain       Hospital Summary   This is a 59 year old female with history of recurrent idiopathic pancreatitis, paroxysmal A. fib, CAD s/p CABG, hypertension, anxiety, recent CVA, recent fall in March, AAA who presented to the ED on 6/22 with persistent epigastric abdominal pain with associated nausea and vomiting.  Had been taking Flomax in order to pass a kidney stone recently and was having some hematuria but this had resolved.  ED Course: Afebrile, mildly tachycardic, BP 136/96.  CBC notable for leukocytosis 16.8, hemoconcentration with hemoglobin 15.4.  BMP was notable for potassium of 3.0 and glucose 138.  Lipase elevated 6271.  Urinalysis was consistent with infection and reflex to culture.  Chest x-ray negative. GI was consulted for recurrent pancreatitis.    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  6/24:  - EGD/EUS with Dr. Ardis Hughs.  Abnormal head of pancreas without discrete solid or anechoic cystic lesions.  - FNA performed and preliminary review showing no neoplastic cells.  CBD normal and nondilated.   - GI recommended interval imaging of the pancreas with MRI in 2 to 3 months as well as general surgery consult to consider cholecystectomy. - General surgery recommended Cardiology consult for cardiac clearance  6/25:  - cardiology cleared patient for cholecystectomy -General surgery holding off on cholecystectomy until resolution of lipase and  work-up of femur lesion - MRI right femur: Enhancing marrow replacing lesion within the greater trochanter of the proximal right femur measuring up to 4.0 cm with associated pathologic fracture. Edema and enhancement within the posterior and posteromedial soft tissues adjacent to the lesion may reflect tumor extension and/or reactive changes secondary to the adjacent fracture.  - Orthopedic surgery consulted for bone biopsy and eval of fracture -not comfortable with biopsy of this lesion and recommending IR consult and patient is not a candidate for fracture repair  6/26:  -IR consulted for right femur biopsy  6/28:  - CT guided right femur biopsy - whole body bone scan    A & P   Principal Problem:   Acute on chronic pancreatitis ([place]) Active Problems:   Cerebrovascular accident (CVA) due to occlusion of left middle cerebral artery (HCC)   Hyperlipidemia LDL goal <70   Hypokalemia   CAD in native artery   Benign essential HTN   Chronic diastolic CHF (congestive heart failure) (HCC)   Paroxysmal atrial fibrillation ([place])   Anxiety   Recurrent pancreatitis   Preoperative evaluation of a medical condition to rule out surgical contraindications (TAR required)   Pure hypercholesterolemia   1. Recurrent acute on chronic idiopathic pancreatitis with abnormal finding of pancreatic head s/p EGD/EUS/FNA with Dr. Ardis Hughs on 6/24  a. Lipase improved, not resolved b. EGD/EUS with Dr. Ardis Hughs 6/24:.  Abnormal head of pancreas without discrete solid or anechoic cystic lesions.  FNA performed. CBD normal and nondilated.  GI recommended interval imaging of the pancreas with MRI in 2 to 3 months. c. Final pathology  from FNA-no malignant cells d. MRI abdomen/MRCP-similar to prior scans e. Appreciate GI recommendations, and when to restart Xarelto  2. Biliary sludge   Cholelithiasis a. Possible surgery later this week for cholecystectomy versus discharge home with follow-up in the office for  scheduled cholecystectomy in the near future, depending upon lipase level and symptoms  3. 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 No known history of cancer but has had significant weight loss in the past few months b. MRI with and without contrast: Enhancing marrow replacing lesion within the greater trochanter of the proximal right femur measuring up to 4.0 cm with associated pathologic fracture. Edema and enhancement within the posterior and posteromedial soft tissues adjacent to the lesion may reflect tumor extension and/or reactive changes secondary to the adjacent fracture.  c. CT-guided bone biopsy today d. Whole body bone scan today  4. Reactive leukocytosis, resolved   5. Tick removed from posterior neck a. No sign of tickborne illness, will hold off on empiric treatment  6. Asymptomatic E. coli UTI a. Hold antibiotics for now.  Will start if she becomes febrile or has symptoms  7. History of nephrolithiasis, resolved a. On Flomax  8. Hypotension Resolved  9. History of gastritis/duodenitis On PPI  10. Hyperlipidemia not on medication  11. Hypertension  a. Continue low-dose Coreg for now  12. Chronic diastolic heart failure, not in acute exacerbation a. Will monitor for volume overload as she is getting high volumes of fluids pancreatitis b. Daily weights and I/O  13. Paroxysmal A. fib on Xarelto and Coreg a. Holding Xarelto as above -pending GI recommendations  14. Anxiety stable on home Vistaril  15. Stable infrarenal AAA a. Repeat ultrasound in 3 years for surveillance  DVT prophylaxis: Place and maintain sequential compression device Start: 03/18/20 1526 Place and maintain sequential compression device Start: 03/18/20 1526   Family Communication: Husband at bedside  Disposition Plan: Probable discharge tomorrow pending GI and general surgery recommendations as well as  improved symptoms and diet advancement Status is: Inpatient  Remains inpatient appropriate because:IV treatments appropriate due to intensity of illness or inability to take PO and Inpatient level of care appropriate due to severity of illness   Dispo: The patient is from: Home              Anticipated d/c is to: Home              Anticipated d/c date is: 1 day              Patient currently is not medically stable to d/c.          Pressure injury documentation    None  Consultants  GI  General surgery Cardiology Orthopedic surgery IR  Procedures   6/24 EUS/ERCP/FNA 6/28 CT-guided right femur biopsy 6/28 whole-body bone scan  Antibiotics   Anti-infectives (From admission, onward)   Start     Dose/Rate Route Frequency Ordered Stop   03/17/20 1100  cefTRIAXone (ROCEPHIN) 1 g in sodium chloride 0.9 % 100 mL IVPB        1 g 200 mL/hr over 30 Minutes Intravenous  Once 03/17/20 1046 03/17/20 1551        Subjective   Patient seen and examined at bedside no acute distress and resting comfortably.  No events overnight.  Hoping to go home today and is hoping to eat as well  Denies any chest pain, shortness of breath, fever, nausea,  vomiting, urinary complaints. Otherwise ROS negative   Objective   Vitals:   03/23/20 1116 03/23/20 1153 03/23/20 1227 03/23/20 1347  BP: 109/65 113/67 (!) 121/55 114/70  Pulse: 70 85 77 84  Resp: 17 16 17 17   Temp:   98.2 F (36.8 C) 98.3 F (36.8 C)  TempSrc:      SpO2: 97% 99% 95% 97%  Weight:      Height:        Intake/Output Summary (Last 24 hours) at 03/23/2020 1557 Last data filed at 03/23/2020 1542 Gross per 24 hour  Intake 2778.74 ml  Output --  Net 2778.74 ml   Filed Weights   03/19/20 1032 03/20/20 0607 03/23/20 0500  Weight: 52.6 kg 52.1 kg 47.9 kg    Examination:  Physical Exam Vitals and nursing note reviewed.  Constitutional:      Appearance: Normal appearance.  HENT:     Head: Normocephalic and  atraumatic.  Eyes:     Conjunctiva/sclera: Conjunctivae normal.  Cardiovascular:     Rate and Rhythm: Normal rate and regular rhythm.  Pulmonary:     Effort: Pulmonary effort is normal.     Breath sounds: Normal breath sounds.  Abdominal:     General: Abdomen is flat.     Palpations: Abdomen is soft.     Comments: Mild epigastric tenderness  Musculoskeletal:        General: No swelling or tenderness.  Skin:    Coloration: Skin is not jaundiced or pale.  Neurological:     Mental Status: She is alert. Mental status is at baseline.  Psychiatric:        Mood and Affect: Mood normal.        Behavior: Behavior normal.     Data Reviewed: I have personally reviewed following labs and imaging studies  CBC: Recent Labs  Lab 03/17/20 0231 03/18/20 0548 03/19/20 0504 03/20/20 0442 03/22/20 0541  WBC 16.8* 15.7* 8.1 7.5 8.0  HGB 15.4* 15.9* 12.5 12.2 13.1  HCT 47.5* 48.1* 38.5 38.5 40.9  MCV 98.8 95.4 95.8 98.2 97.6  PLT 377 362 300 279 277   Basic Metabolic Panel: Recent Labs  Lab 03/17/20 0231 03/17/20 0817 03/18/20 0548 03/19/20 0504 03/20/20 0442 03/21/20 0508 03/22/20 0541  NA   < >  --  132* 139 140 136 143  K   < >  --  3.7 3.4* 3.7 3.6 3.5  CL   < >  --  104 107 107 104 108  CO2   < >  --  21* 25 27 25 25   GLUCOSE   < >  --  83 76 83 91 85  BUN   < >  --  10 10 7  <5* <5*  CREATININE   < >  --  0.47 0.46 0.55 0.45 0.49  CALCIUM   < >  --  8.5* 8.3* 8.3* 8.5* 8.5*  MG  --  2.2 2.0  --   --   --   --    < > = values in this interval not displayed.   GFR: Estimated Creatinine Clearance: 54.4 mL/min (by C-G formula based on SCr of 0.49 mg/dL). Liver Function Tests: Recent Labs  Lab 03/17/20 0231 03/18/20 0548 03/20/20 0442 03/21/20 0508  AST 19 20 15 15   ALT 15 13 11 11   ALKPHOS 70 62 42 48  BILITOT 0.4 0.5 0.5 0.6  PROT 7.2 6.2* 4.9* 5.7*  ALBUMIN 4.2 3.2* 2.8* 3.0*   Recent Labs  Lab 03/17/20 0231 03/20/20 0442 03/21/20 0508 03/22/20 0541  03/23/20 0528  LIPASE 6,271* 189* 208* 173* 143*   No results for input(s): AMMONIA in the last 168 hours. Coagulation Profile: Recent Labs  Lab 03/17/20 0817  INR 1.2   Cardiac Enzymes: No results for input(s): CKTOTAL, CKMB, CKMBINDEX, TROPONINI in the last 168 hours. BNP (last 3 results) No results for input(s): PROBNP in the last 8760 hours. HbA1C: No results for input(s): HGBA1C in the last 72 hours. CBG: No results for input(s): GLUCAP in the last 168 hours. Lipid Profile: No results for input(s): CHOL, HDL, LDLCALC, TRIG, CHOLHDL, LDLDIRECT in the last 72 hours. Thyroid Function Tests: No results for input(s): TSH, T4TOTAL, FREET4, T3FREE, THYROIDAB in the last 72 hours. Anemia Panel: No results for input(s): VITAMINB12, FOLATE, FERRITIN, TIBC, IRON, RETICCTPCT in the last 72 hours. Sepsis Labs: Recent Labs  Lab 03/17/20 0817  LATICACIDVEN 1.1    Recent Results (from the past 240 hour(s))  Blood Culture (routine x 2)     Status: None   Collection Time: 03/17/20  8:17 AM   Specimen: BLOOD LEFT HAND  Result Value Ref Range Status   Specimen Description   Final    BLOOD LEFT HAND Performed at [facility], [place] [address], [place], [place] [address]    Special Requests   Final    BOTTLES DRAWN AEROBIC AND ANAEROBIC Blood Culture results may not be optimal due to an inadequate volume of blood received in culture bottles Performed at [place] [address], [place], [place] [address]    Culture   Final    NO GROWTH 5 DAYS Performed at [facility], [place] [address], [place], [place] [address]    Report Status 03/22/2020 FINAL  Final  Blood Culture (routine x 2)     Status: None   Collection Time: 03/17/20  9:14 AM   Specimen: BLOOD  Result Value Ref Range Status   Specimen Description   Final    BLOOD RIGHT ANTECUBITAL Performed at [place] [address], [place], [place] [address]     Special Requests   Final    BOTTLES DRAWN AEROBIC AND ANAEROBIC Blood Culture results may not be optimal due to an excessive volume of blood received in culture bottles Performed at [place] [address], [place], [place] [address]    Culture   Final    NO GROWTH 5 DAYS Performed at [facility], [place] [address] [place],  [address]    Report Status 03/22/2020 FINAL  Final  Urine culture     Status: Abnormal   Collection Time: 03/17/20  9:48 AM   Specimen: Urine, Clean Catch  Result Value Ref Range Status   Specimen Description   Final    URINE, CLEAN CATCH Performed at [facility], [place] [address], [place],  [address]    Special Requests   Final    NONE Performed at [facility], [place] [address] [place],  [address]    Culture >=100,000 COLONIES/mL ESCHERICHIA COLI (A)  Final   Report Status 03/19/2020 FINAL  Final   Organism ID, Bacteria ESCHERICHIA COLI (A)  Final      Susceptibility   Escherichia coli - MIC*    AMPICILLIN <=2 SENSITIVE Sensitive     CEFAZOLIN <=4 SENSITIVE Sensitive     CEFTRIAXONE <=0.25 SENSITIVE Sensitive     CIPROFLOXACIN <=0.25 SENSITIVE Sensitive  GENTAMICIN <=1 SENSITIVE Sensitive     IMIPENEM <=0.25 SENSITIVE Sensitive     NITROFURANTOIN <=16 SENSITIVE Sensitive     TRIMETH/SULFA <=20 SENSITIVE Sensitive     AMPICILLIN/SULBACTAM <=2 SENSITIVE Sensitive     PIP/TAZO <=4 SENSITIVE Sensitive     * >=100,000 COLONIES/mL ESCHERICHIA COLI  SARS Coronavirus 2 by RT PCR (hospital order, performed in [place] hospital lab) Nasopharyngeal Nasopharyngeal Swab     Status: None   Collection Time: 03/17/20  9:58 PM   Specimen: Nasopharyngeal Swab  Result Value Ref Range Status   SARS Coronavirus 2 NEGATIVE NEGATIVE Final    Comment: (NOTE) SARS-CoV-2 target nucleic acids are NOT DETECTED.  The SARS-CoV-2 RNA is generally detectable in upper and lower respiratory  specimens during the acute phase of infection. The lowest concentration of SARS-CoV-2 viral copies this assay can detect is 250 copies / mL. A negative result does not preclude SARS-CoV-2 infection and should not be used as the sole basis for treatment or other patient management decisions.  A negative result may occur with improper specimen collection / handling, submission of specimen other than nasopharyngeal swab, presence of viral mutation(s) within the areas targeted by this assay, and inadequate number of viral copies (<250 copies / mL). A negative result must be combined with clinical observations, patient history, and epidemiological information.  Fact Sheet for Patients:   StrictlyIdeas.no  Fact Sheet for Healthcare Providers: BankingDealers.co.za  This test is not yet approved or  cleared by the [place] FDA and has been authorized for detection and/or diagnosis of SARS-CoV-2 by FDA under an Emergency Use Authorization (EUA).  This EUA will remain in effect (meaning this test can be used) for the duration of the COVID-19 declaration under Section 564(b)(1) of the Act, 21 U.S.C. section 360bbb-3(b)(1), unless the authorization is terminated or revoked sooner.  Performed at [facility], [place] [address] [place], [place] [address]   Surgical pcr screen     Status: None   Collection Time: 03/19/20  3:20 AM   Specimen: Nasal Mucosa; Nasal Swab  Result Value Ref Range Status   MRSA, PCR NEGATIVE NEGATIVE Final   Staphylococcus aureus NEGATIVE NEGATIVE Final    Comment: (NOTE) The Xpert SA Assay (FDA approved for NASAL specimens in patients 21 years of age and older), is one component of a comprehensive surveillance program. It is not intended to diagnose infection nor to guide or monitor treatment. Performed at [facility], [place] [address] [place], [place] [address]           Radiology Studies: NM Bone Scan Whole Body  Result Date: 03/23/2020 CLINICAL DATA:  Bone lesion at greater trochanter RIGHT femur concern for pathologic fracture question malignancy EXAM: NUCLEAR MEDICINE WHOLE BODY BONE SCAN TECHNIQUE: Whole body anterior and posterior images were obtained approximately 3 hours after intravenous injection of radiopharmaceutical. RADIOPHARMACEUTICALS:  20.9 mCi Technetium-36m MDP IV COMPARISON:  None Correlation: MRI RIGHT femur 11/20/2019, CT abdomen and pelvis 03/17/2020 FINDINGS: Focal abnormal increased tracer accumulation is seen at the RIGHT greater trochanter corresponding to lesion identified on MR. Additional focus of abnormal tracer uptake is seen at a lower lateral LEFT rib approximately ninth; this corresponds to a healing fracture on CT. Focus of uptake in the lower cervical spine at approximately C6 or C7 of uncertain etiology. No additional sites of abnormal osseous tracer accumulation are identified. Expected urinary tract and soft tissue distribution of tracer. IMPRESSION: Abnormal uptake at RIGHT greater trochanter corresponding to  abnormality on MR, could represent a lytic bone lesion or combination of both lesion and pathologic fracture. Uptake at healing lateral LEFT ninth rib fracture. Nonspecific uptake in lower cervical spine at C6 or C7 of uncertain etiology; consider MR assessment. Electronically Signed   By: Lavonia Dana M.D.   On: 03/23/2020 15:36   CT BIOPSY  Result Date: 03/23/2020 INDICATION: 59 year old with fracture of the right femoral greater trochanter. This is concerning for a pathologic fracture and request for biopsy. EXAM: CT-GUIDED BIOPSY OF RIGHT FEMORAL LESION MEDICATIONS: None. ANESTHESIA/SEDATION: Moderate (conscious) sedation was employed during this procedure. A total of Versed 2.0 mg and Fentanyl 50 mcg was administered intravenously. Moderate Sedation Time: 10 minutes. The patient's level of consciousness and vital  signs were monitored continuously by radiology nursing throughout the procedure under my direct supervision. FLUOROSCOPY TIME:  None COMPLICATIONS: None immediate. PROCEDURE: Informed written consent was obtained from the patient after a thorough discussion of the procedural risks, benefits and alternatives. All questions were addressed. A timeout was performed prior to the initiation of the procedure. Patient was placed in a left lateral decubitus position. Images through the pelvis were obtained. The right greater trochanter was identified and targeted. The overlying skin was prepped with chlorhexidine and a sterile field was created. Skin and soft tissues were anesthetized with 1% lidocaine. Using CT guidance, a 17 gauge coaxial needle was directed through the fracture cortex into the lucent or soft tissue component of the fracture/lesion. 54 gauge core biopsy obtained but the needles were damaged during the core biopsy and could not be used for additional core biopsies. A new 17 gauge needle was directed into the soft tissue component and additional core biopsies were obtained with an 18 gauge core device. Specimens placed in formalin. Bandage placed over the puncture site. FINDINGS: Evidence for fracture involving the right femoral greater trochanter. There is a lucent lesion associated with the greater trochanter. Needle was directed through the fracture cortex into the soft tissue component. Small soft tissue core biopsies were obtained. IMPRESSION: CT-guided core biopsy of the right femoral greater trochanter lesion. Electronically Signed   By: Markus Daft M.D.   On: 03/23/2020 15:37        Scheduled Meds:  baclofen  10 mg Oral BID   calcium carbonate  1 tablet Oral BID   carvedilol  3.125 mg Oral BID WC   cetirizine  10 mg Oral Daily   fentaNYL       hydrOXYzine  10 mg Oral QHS   midazolam       pantoprazole  40 mg Oral Daily   tamsulosin  0.4 mg Oral Daily   ursodiol  300 mg Oral  BID   Continuous Infusions:  lactated ringers 125 mL/hr at 03/23/20 1527     Time spent:  25 minutes with over 50% of the time coordinating the patient's care    Harold Hedge, DO [facility] Pager [phone]  Call night coverage person covering after 7pm

## 2020-03-23 NOTE — Progress Notes (Addendum)
Patient ID: [name], female   DOB: 07/27/1961, 59 y.o.   MRN: [id number]    Progress Note   Subjective   Day # 6  CC; recurrent pancreatitis  EUS with FNA 03/19/2020 pancreatic lesion, final path showed no malignant cells Surgery considering empiric cholecystectomy secondary to recurrent pancreatitis  Status post CT-guided biopsy of right femur today, concern for pathologic fracture Also to have bone scan today  Lipase today 143, improving CA 19-9 pending  Abdominal pain has gradually improved since admit, though not resolved.  She says she is doing okay on liquid diet with supplements but is afraid to try solid food, for fear of aggravating pancreatitis.  She is not requiring any regular pain medication. She would like to go home if no plans for cholecystectomy in the next few days.   Objective   Vital signs in last 24 hours: Temp:  [97.4 F (36.3 C)-98 F (36.7 C)] 97.9 F (36.6 C) (06/28 1021) Pulse Rate:  [62-92] 62 (06/28 1021) Resp:  [10-18] 14 (06/28 1021) BP: (81-147)/(52-97) 119/61 (06/28 1021) SpO2:  [96 %-100 %] 100 % (06/28 1021) Weight:  [47.9 kg] 47.9 kg (06/28 0500) Last BM Date: 03/24/20 General:    white female in NAD Heart:  Regular rate and rhythm; no murmurs Lungs: Respirations even and unlabored, lungs CTA bilaterally Abdomen:  Soft, mildly tender across the upper abdomen and nondistended. Normal bowel sounds. Extremities:  Without edema. Neurologic:  Alert and oriented,  grossly normal neurologically. Psych:  Cooperative. Normal mood and affect.  Intake/Output from previous day: 06/27 0701 - 06/28 0700 In: 3221.1 [P.O.:870; I.V.:2351.1] Out: -  Intake/Output this shift: No intake/output data recorded.  Lab Results: Recent Labs    03/22/20 0541  WBC 8.0  HGB 13.1  HCT 40.9  PLT 307   BMET Recent Labs    03/21/20 0508 03/22/20 0541  NA 136 143  K 3.6 3.5  CL 104 108  CO2 25 25  GLUCOSE 91 85  BUN <5* <5*  CREATININE 0.45  0.49  CALCIUM 8.5* 8.5*   LFT Recent Labs    03/21/20 0508  PROT 5.7*  ALBUMIN 3.0*  AST 15  ALT 11  ALKPHOS 48  BILITOT 0.6   PT/INR No results for input(s): LABPROT, INR in the last 72 hours.  Studies/Results: No results found.     Assessment / Plan:    #48 59 year old white female with recurrent pancreatitis, associated cystic pancreatic lesion.  EUS with FNA this admission showed no evidence of malignancy and MRCP showing no significant changes compared to recent CT. Etiology of recurrent pancreatitis is not clear, she does have biliary sludge and therefore empiric cholecystectomy is being planned. Surgery has seen today, possible surgery later this week.  Hoping lipase will continue to normalize in the interim   #2 right femur lesion, question pathologic fracture.  CT-guided biopsy done earlier today Bone scan to be done later today  #3 coronary artery disease status post CABG #4 recent CVA #5 ureterolithiasis  Plan; continue current diet, clear liquids with supplements Follow lipase, await decision regarding cholecystectomy later this week per surgery. Await results of femur biopsy and bone scan.       Principal Problem:   Acute on chronic pancreatitis (HCC) Active Problems:   Cerebrovascular accident (CVA) due to occlusion of left middle cerebral artery (HCC)   Hyperlipidemia LDL goal <70   Hypokalemia   CAD in native artery   Benign essential HTN   Chronic diastolic  CHF (congestive heart failure) (HCC)   Paroxysmal atrial fibrillation (HCC)   Anxiety   Recurrent pancreatitis   Preoperative evaluation of a medical condition to rule out surgical contraindications (TAR required)   Pure hypercholesterolemia     LOS: 6 days   Amy Esterwood  03/23/2020, 10:36 AM    Attending physician's note   I have taken an interval history, reviewed the chart and examined the patient. I agree with the Advanced Practitioner's note, impression and recommendations.     Bone scan for follow-up of pathologic fracture of right femur  Abdominal pain is improving, tolerating liquids well  Surgery considering improved cholecystectomy for idiopathic recurrent pancreatitis  Continue supportive care  Advance diet as tolerated  Pain control  GI will continue to follow along K. Denzil Magnuson , MD [phone]

## 2020-03-23 NOTE — Progress Notes (Signed)
[facility] Progress Note  4 Days Post-Op  Subjective: Patient denies over abdominal pain. Denies nausea. She has been tolerating CLD without worsened pain or nausea. Having bowel function. Patient wanting to figure out overall plan and if she needs to stay in the hospital, wants to get home.   Objective: Vital signs in last 24 hours: Temp:  [97.4 F (36.3 C)-98 F (36.7 C)] 97.9 F (36.6 C) (06/28 1021) Pulse Rate:  [62-92] 62 (06/28 1021) Resp:  [10-18] 14 (06/28 1021) BP: (81-147)/(52-97) 119/61 (06/28 1021) SpO2:  [96 %-100 %] 100 % (06/28 1021) Weight:  [47.9 kg] 47.9 kg (06/28 0500) Last BM Date: 03/24/20  Intake/Output from previous day: 06/27 0701 - 06/28 0700 In: 3221.1 [P.O.:870; I.V.:2351.1] Out: -  Intake/Output this shift: No intake/output data recorded.  PE: General: pleasant, WD, thin female who is laying in bed in NAD HEENT: Sclera are anicteric.  PERRL.  Ears and nose without any masses or lesions.  Mouth is pink and moist Heart: regular, rate, and rhythm. Palpable radial and pedal pulses bilaterally Lungs: CTAB, no wheezes, rhonchi, or rales noted.  Respiratory effort nonlabored Abd: soft, TTP in RUQ and epigastrium, some fullness in epigastrium but not distended otherwise, +BS    Lab Results:  Recent Labs    03/22/20 0541  WBC 8.0  HGB 13.1  HCT 40.9  PLT 307   BMET Recent Labs    03/21/20 0508 03/22/20 0541  NA 136 143  K 3.6 3.5  CL 104 108  CO2 25 25  GLUCOSE 91 85  BUN <5* <5*  CREATININE 0.45 0.49  CALCIUM 8.5* 8.5*   PT/INR No results for input(s): LABPROT, INR in the last 72 hours. CMP     Component Value Date/Time   NA 143 03/22/2020 0541   NA 139 09/24/2019 1535   K 3.5 03/22/2020 0541   CL 108 03/22/2020 0541   CO2 25 03/22/2020 0541   GLUCOSE 85 03/22/2020 0541   BUN <5 (L) 03/22/2020 0541   BUN 12 09/24/2019 1535   CREATININE 0.49 03/22/2020 0541   CREATININE 0.79 01/08/2018 1615   CALCIUM 8.5 (L)  03/22/2020 0541   PROT 5.7 (L) 03/21/2020 0508   PROT 7.2 09/24/2019 1535   ALBUMIN 3.0 (L) 03/21/2020 0508   ALBUMIN 4.2 09/24/2019 1535   AST 15 03/21/2020 0508   ALT 11 03/21/2020 0508   ALKPHOS 48 03/21/2020 0508   BILITOT 0.6 03/21/2020 0508   BILITOT <0.2 09/24/2019 1535   GFRNONAA >60 03/22/2020 0541   GFRAA >60 03/22/2020 0541   Lipase     Component Value Date/Time   LIPASE 143 (H) 03/23/2020 0528       Studies/Results: No results found.  Anti-infectives: Anti-infectives (From admission, onward)   Start     Dose/Rate Route Frequency Ordered Stop   03/17/20 1100  cefTRIAXone (ROCEPHIN) 1 g in sodium chloride 0.9 % 100 mL IVPB        1 g 200 mL/hr over 30 Minutes Intravenous  Once 03/17/20 1046 03/17/20 1551       Assessment/Plan HTN HLD 3.3 cm infrarenal abdominal aortic aneurysm - outpatient follow up Hx dCHF A. Fib on Xarelto (last dose 6/22) Hx of prior CVA (2018) CAD (s/p CABG x 2 in 2019) - Cardiology, Dr. Carney Living. Coli UTI  Recurrent Pancreatitis Gallbladder sludge - Patient is s/p EUS 6/24 - cytology negative for malignancy  - MRCP 6/25 consistent with pancreatic pseudocyst with trace edema around the head  of the pancrease - lipase still mildly elevated at 143 and patient still ttp with some fullness in epigastrium and RUQ - cardiology has evaluated and determined she has some elevated cardiac risk but that it is not prohibitive for surgery  - Will discuss further with MD - if pain improves and lipase continues to normalize may be able to consider cholecystectomy prior to discharge, however if patient remains really tender and lipase remains elevated it may be better to wait 6-8 weeks to consider cholecystectomy due to risk of irritating the pancreas further - continue actigall for now Lytic Lesion of the greater trochanter of the proximal right femur concerning for possible pathologic fracture - CT biopsy being done today   FEN - FLD, LR @  100 cc/h VTE - SCDs, okay for heparin from our standpoint ID - Rocephin x 1 for UTI; no current abx  LOS: 6 days    Norm Parcel , [facility] 03/23/2020, 10:33 AM Please see Amion for pager number during day hours 7:00am-4:30pm

## 2020-03-24 LAB — BASIC METABOLIC PANEL
Anion gap: 7 (ref 5–15)
BUN: 9 mg/dL (ref 6–20)
CO2: 27 mmol/L (ref 22–32)
Calcium: 8.7 mg/dL — ABNORMAL LOW (ref 8.9–10.3)
Chloride: 106 mmol/L (ref 98–111)
Creatinine, Ser: 0.53 mg/dL (ref 0.44–1.00)
GFR calc Af Amer: >60 mL/min (ref >60)
GFR calc non Af Amer: >60 mL/min (ref >60)
Glucose, Bld: 114 mg/dL — ABNORMAL HIGH (ref 70–99)
Potassium: 3.4 mmol/L — ABNORMAL LOW (ref 3.5–5.1)
Sodium: 140 mmol/L (ref 135–145)

## 2020-03-24 LAB — CBC
HCT: 40.8 % (ref 36.0–46.0)
Hemoglobin: 12.7 g/dL (ref 12.0–15.0)
MCH: 30.8 pg (ref 26.0–34.0)
MCHC: 31.1 g/dL (ref 30.0–36.0)
MCV: 98.8 fL (ref 80.0–100.0)
Platelets: 302 10*3/uL (ref 150–400)
RBC: 4.13 MIL/uL (ref 3.87–5.11)
RDW: 13.2 % (ref 11.5–15.5)
WBC: 8.3 10*3/uL (ref 4.0–10.5)
nRBC: 0 % (ref 0.0–0.2)

## 2020-03-24 LAB — LIPASE, BLOOD: Lipase: 174 U/L — ABNORMAL HIGH (ref 11–51)

## 2020-03-24 LAB — CANCER ANTIGEN 19-9: CA 19-9: 40 U/mL — ABNORMAL HIGH (ref 0–35)

## 2020-03-24 MED ORDER — POTASSIUM CHLORIDE CRYS ER 20 MEQ PO TBCR
20.0000 meq | EXTENDED_RELEASE_TABLET | Freq: Once | ORAL | Status: AC
  Administered 2020-03-24: 20 meq via ORAL
  Filled 2020-03-24: qty 1

## 2020-03-24 MED ORDER — URSODIOL 300 MG PO CAPS: 300.0000 mg | ORAL_CAPSULE | Freq: Two times a day (BID) | ORAL | 0 refills | Status: DC

## 2020-03-24 NOTE — Progress Notes (Addendum)
Patient ID: [name], female   DOB: Nov 23, 1960, 59 y.o.   MRN: [id number]    Progress Note   Subjective   Day # 7  CC; recurrent pancreatitis  Bone scan -abnormal uptake at the right greater trochanter, could represent lytic bone lesion or combination of both lesion and pathologic fracture, uptake and healing left ninth rib fracture, nonspecific uptake in the lower cervical spine C6 or C7 of uncertain etiology, consider MR  Right femur biopsy-pending  Multiple myeloma panel-pending  Lipase 174 Potassium 3.4 CBC within normal limits  Patient in bathroom showering, says she is able to tolerate some solid food, no nausea or vomiting and wants to go home. Surgery has seen this morning and okay with patient being discharged with plan to follow-up in office and schedule cholecystectomy.   Objective   Vital signs in last 24 hours: Temp:  [97.4 F (36.3 C)-98.3 F (36.8 C)] 97.9 F (36.6 C) (06/29 0525) Pulse Rate:  [62-92] 74 (06/29 0525) Resp:  [14-19] 19 (06/29 0525) BP: (109-133)/(55-83) 117/72 (06/29 0654) SpO2:  [95 %-100 %] 98 % (06/29 0525) Weight:  [51 kg] 51 kg (06/29 0708) Last BM Date: 03/23/20   Intake/Output from previous day: 06/28 0701 - 06/29 0700 In: 3337.8 [P.O.:960; I.V.:2377.8] Out: 0  Intake/Output this shift: No intake/output data recorded.  Lab Results: Recent Labs    03/22/20 0541 03/24/20 0500  WBC 8.0 8.3  HGB 13.1 12.7  HCT 40.9 40.8  PLT 307 302   BMET Recent Labs    03/22/20 0541 03/24/20 0500  NA 143 140  K 3.5 3.4*  CL 108 106  CO2 25 27  GLUCOSE 85 114*  BUN <5* 9  CREATININE 0.49 0.53  CALCIUM 8.5* 8.7*   LFT No results for input(s): PROT, ALBUMIN, AST, ALT, ALKPHOS, BILITOT, BILIDIR, IBILI in the last 72 hours. PT/INR No results for input(s): LABPROT, INR in the last 72 hours.  Studies/Results: NM Bone Scan Whole Body  Result Date: 03/23/2020 CLINICAL DATA:  Bone lesion at greater trochanter RIGHT femur  concern for pathologic fracture question malignancy EXAM: NUCLEAR MEDICINE WHOLE BODY BONE SCAN TECHNIQUE: Whole body anterior and posterior images were obtained approximately 3 hours after intravenous injection of radiopharmaceutical. RADIOPHARMACEUTICALS:  20.9 mCi Technetium-61mMDP IV COMPARISON:  None Correlation: MRI RIGHT femur 11/20/2019, CT abdomen and pelvis 03/17/2020 FINDINGS: Focal abnormal increased tracer accumulation is seen at the RIGHT greater trochanter corresponding to lesion identified on MR. Additional focus of abnormal tracer uptake is seen at a lower lateral LEFT rib approximately ninth; this corresponds to a healing fracture on CT. Focus of uptake in the lower cervical spine at approximately C6 or C7 of uncertain etiology. No additional sites of abnormal osseous tracer accumulation are identified. Expected urinary tract and soft tissue distribution of tracer. IMPRESSION: Abnormal uptake at RIGHT greater trochanter corresponding to abnormality on MR, could represent a lytic bone lesion or combination of both lesion and pathologic fracture. Uptake at healing lateral LEFT ninth rib fracture. Nonspecific uptake in lower cervical spine at C6 or C7 of uncertain etiology; consider MR assessment. Electronically Signed   By: MLavonia DanaM.D.   On: 03/23/2020 15:36   CT BIOPSY  Result Date: 03/23/2020 INDICATION: 59year old with fracture of the right femoral greater trochanter. This is concerning for a pathologic fracture and request for biopsy. EXAM: CT-GUIDED BIOPSY OF RIGHT FEMORAL LESION MEDICATIONS: None. ANESTHESIA/SEDATION: Moderate (conscious) sedation was employed during this procedure. A total of Versed 2.0 mg and  Fentanyl 50 mcg was administered intravenously. Moderate Sedation Time: 10 minutes. The patient's level of consciousness and vital signs were monitored continuously by radiology nursing throughout the procedure under my direct supervision. FLUOROSCOPY TIME:  None  COMPLICATIONS: None immediate. PROCEDURE: Informed written consent was obtained from the patient after a thorough discussion of the procedural risks, benefits and alternatives. All questions were addressed. A timeout was performed prior to the initiation of the procedure. Patient was placed in a left lateral decubitus position. Images through the pelvis were obtained. The right greater trochanter was identified and targeted. The overlying skin was prepped with chlorhexidine and a sterile field was created. Skin and soft tissues were anesthetized with 1% lidocaine. Using CT guidance, a 17 gauge coaxial needle was directed through the fracture cortex into the lucent or soft tissue component of the fracture/lesion. 18 gauge core biopsy obtained but the needles were damaged during the core biopsy and could not be used for additional core biopsies. A new 17 gauge needle was directed into the soft tissue component and additional core biopsies were obtained with an 18 gauge core device. Specimens placed in formalin. Bandage placed over the puncture site. FINDINGS: Evidence for fracture involving the right femoral greater trochanter. There is a lucent lesion associated with the greater trochanter. Needle was directed through the fracture cortex into the soft tissue component. Small soft tissue core biopsies were obtained. IMPRESSION: CT-guided core biopsy of the right femoral greater trochanter lesion. Electronically Signed   By: Adam  Henn M.D.   On: 03/23/2020 15:37       Assessment / Plan:    #1 59-year-old white female with recurrent pancreatitis, associated cystic pancreatic lesion. EUS with FNA this admission-no evidence of malignancy. MRCP this admit no significant changes compared to recent CT. Etiology of her recurrent pancreatitis is not clear-does have biliary sludge and therefore empiric cholecystectomy is to be done.  Patient is feeling better, tolerating some solid food and wishes to go home. Plan  per surgery is follow-up in office and schedule cholecystectomy. Hopefully this can be done in the very near future to prevent any recurrent episodes of pancreatitis  #2 history recent CVA #3 coronary artery disease status post CABG #4 right femur lesion/fracture--work-up in progress, CT biopsy done yesterday pending. Bone scan results as above.  Okay for from GI standpoint if patient discharged home today to follow-up with surgery as soon as possible. 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 I agree with the Advanced Practitioner's note, impression and recommendations.   Abnormal uptake on bone scan at right greater trochanter, await multiple myeloma panel and right femur biopsy    Plan to follow-up with surgery as outpatient and schedule cholecystectomy  K. Denzil Magnuson , MD [phone]

## 2020-03-24 NOTE — Discharge Summary (Signed)
Physician Discharge Summary  EVY LUTTERMAN NGE:[id number] DOB: 05-13-1961   PCP: [name], DO  Admit date: 03/17/2020 Discharge date: 03/24/2020 Length of Stay: 7 days   Code Status: Full Code  Admitted From:  Home Discharged to:   [place]:  None  Equipment/Devices:  None Discharge Condition:  Stable  Recommendations for Outpatient Follow-up   1. Should follow-up with PCP for TOC visit to help coordinate care moving forward 2. To follow-up with general surgery for cholecystectomy  3. Follow-up with bone biopsy results which are concerning for metastatic disease-oncology has been notified and will reach out to the patient to set up an appointment, pending the results 4. Follow-up on myeloma panel 5. Interval imaging of the pancreas in 2 to 3 months with MRI  Hospital Summary    This is a 59 year old female with history of recurrent idiopathic pancreatitis, paroxysmal A. fib, CAD s/p CABG, hypertension, anxiety, recent CVA, recent fall in March, AAA who presented to the ED on 6/22 with persistent epigastric abdominal pain with associated nausea and vomiting.  Had been taking Flomax in order to pass a kidney stone recently and was having some hematuria but this had resolved.  ED Course:Afebrile, mildly tachycardic, BP 136/96.CBC notable for leukocytosis 16.8, hemoconcentration with hemoglobin 15.4. BMP was notable for potassium of 3.0 and glucose 138. Lipase elevated 6271. Urinalysis was consistent with infection.Chest x-ray negative. GI was consulted for recurrent pancreatitis.  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  6/24:  -  EGD/EUS with Dr. Ardis Hughs.  Abnormal head of pancreas without discrete solid or anechoic cystic lesions.  - FNA performed. CBD normal and nondilated.   - GI recommended interval imaging of the pancreas with MRI in 2 to 3 months as well as general surgery consult as etiology of recurrent pancreatitis is not clear and she does have biliary sludge and therefore empiric cholecystectomy should be considered  - General surgery recommended Cardiology consult for cardiac clearance  6/25:  - cardiology cleared patient for cholecystectomy -General surgery holding off on cholecystectomy until resolution of lipase and work-up of femur lesion - MRI right femur: Enhancing marrow replacing lesion within the greater trochanter of the proximal right femur measuring up to 4.0 cm with associated pathologic fracture. Edema and enhancement within the posterior and posteromedial soft tissues adjacent to the lesion may reflect tumor extension and/or reactive changes secondary to the adjacent fracture.  - Orthopedic surgery consulted for bone biopsy and eval of fracture -not comfortable with biopsy of this lesion and recommended IR consult and patient is not a candidate for fracture repair  6/26:  -IR consulted for right femur biopsy  6/28:  - CT guided right femur biopsy - whole body bone scan -abnormal uptake in right greater trochanter, uptake at healing lateral left ninth rib fracture, nonspecific uptake in lower cervical spine at C6 or C7 consider MR assessment.  6/29:  Clinically improved despite elevated lipase.  Discussed with oncology who is to follow-up with biopsy results and reach out to patient depending on the results.  She is to follow-up with general surgery in the coming weeks for empiric cholecystectomy and continued on Actigall started this admission.  Advised to stick  with clear liquid diet for the next several days for her pancreatitis to resolve as her lipase increased every time she would eat solid  food  A & P   Principal Problem:   Acute on chronic pancreatitis ([place]) Active Problems:   Cerebrovascular accident (CVA) due to occlusion of left middle cerebral artery (HCC)   Hyperlipidemia LDL goal <70   Hypokalemia   CAD in native artery   Benign essential HTN   Chronic diastolic CHF (congestive heart failure) (HCC)   Paroxysmal atrial fibrillation (HCC)   Anxiety   Recurrent pancreatitis   Preoperative evaluation of a medical condition to rule out surgical contraindications (TAR required)   Pure hypercholesterolemia   Lytic bone lesion of femur    1. Recurrent acute on chronic idiopathic pancreatitis with abnormal finding of pancreatic head s/p EGD/EUS/FNA with Dr. Ardis Hughs on 6/24  a. Clinically improved despite increased lipase to 173 b. EGD/EUS results as above c. Final pathology from FNA-no malignant cells d. MRI abdomen/MRCP-similar to prior scans e. To follow-up with general surgery for empiric cholecystectomy f. Lipase in 1 week g. Clear liquid diet x1 week  2. Biliary sludge  Cholelithiasis a. To follow-up outpatient for surgery b. Actigall started this admission  3. 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 No known history of cancer but has had significant weight loss in the past few months b. MRI with and without contrast: Enhancing marrow replacing lesion within the greater trochanter of the proximal right femur measuring up to 4.0 cm with associated pathologic fracture. Edema and enhancement within the posterior and posteromedial soft tissues adjacent to the lesion may reflect tumor extension and/or reactive changes secondary to the adjacent fracture.  c. CT-guided bone biopsy pending, follow-up d. Whole body bone scan - abnormal uptake in right greater trochanter, uptake at healing lateral left ninth rib fracture, nonspecific uptake in lower  cervical spine at C6 or C7 consider MR assessment. e. Myeloma panel pending f. Oncology following peripherally, pending results  4. Reactive leukocytosis, resolved   5. Tick removed from posterior neck a. No sign of tickborne illness, did not receive treatment  6. Asymptomatic E. coli UTI 1. No antibiotics   7. History of nephrolithiasis, resolved a. On Flomax  8. Hypotension Resolved  9. History of gastritis/duodenitis On PPI  10. Hyperlipidemia not on medication  11. Hypertension  a. Continue low-dose Coreg for now  12. Chronic diastolic heart failure, not in acute exacerbation a. Will monitor for volume overload as she is getting high volumes of fluids pancreatitis b. Daily weights and I/O  13. Paroxysmal A. fib on Xarelto and Coreg  14. Anxiety stable on home Vistaril  15. Stable infrarenal AAA a. Repeat ultrasound in 3 years for surveillance    Consultants  GI  General surgery Cardiology Orthopedic surgery IR  Procedures  6/24 EUS/ERCP/FNA 6/28 CT-guided right femur biopsy 6/28 whole-body bone scan  Antibiotics   Anti-infectives (From admission, onward)   Start     Dose/Rate Route Frequency Ordered Stop   03/17/20 1100  cefTRIAXone (ROCEPHIN) 1 g in sodium chloride 0.9 % 100 mL IVPB        1 g 200 mL/hr over 30 Minutes Intravenous  Once 03/17/20 1046 03/17/20 1551       Subjective  Patient seen and examined at bedside no acute distress and resting comfortably with husband at bedside.  No events overnight.  Tolerating  soft diet without any recurrence of abdominal pain.  Denies any chest pain, shortness of breath, fever, nausea, vomiting, urinary or bowel complaints. Otherwise ROS negative    Objective   Discharge Exam: Vitals:   03/24/20 0525 03/24/20 0654  BP:  117/72  Pulse: 74   Resp: 19   Temp: 97.9 F (36.6 C)   SpO2: 98%    Vitals:   03/23/20 2115 03/24/20 0525 03/24/20 0654 03/24/20 0708  BP: 110/75  117/72     Pulse: 92 74    Resp: 19 19    Temp: 97.8 F (36.6 C) 97.9 F (36.6 C)    TempSrc: Oral Oral    SpO2: 97% 98%    Weight:    51 kg  Height:        Physical Exam Vitals and nursing note reviewed.  Constitutional:      Appearance: Normal appearance.  HENT:     Head: Normocephalic and atraumatic.  Eyes:     Conjunctiva/sclera: Conjunctivae normal.  Cardiovascular:     Rate and Rhythm: Normal rate and regular rhythm.  Pulmonary:     Effort: Pulmonary effort is normal.     Breath sounds: Normal breath sounds.  Abdominal:     General: Abdomen is flat.     Palpations: Abdomen is soft.  Musculoskeletal:        General: No swelling or tenderness.       Arms:  Skin:    Coloration: Skin is not jaundiced or pale.  Neurological:     Mental Status: She is alert. Mental status is at baseline.  Psychiatric:        Mood and Affect: Mood normal.        Behavior: Behavior normal.       The results of significant diagnostics from this hospitalization (including imaging, microbiology, ancillary and laboratory) are listed below for reference.     Microbiology: Recent Results (from the past 240 hour(s))  Blood Culture (routine x 2)     Status: None   Collection Time: 03/17/20  8:17 AM   Specimen: BLOOD LEFT HAND  Result Value Ref Range Status   Specimen Description   Final    BLOOD LEFT HAND Performed at [facility] [address] [place], [place] [address]    Special Requests   Final    BOTTLES DRAWN AEROBIC AND ANAEROBIC Blood Culture results may not be optimal due to an inadequate volume of blood received in culture bottles Performed at [facility] [address] [place], [place] [address]    Culture   Final    NO GROWTH 5 DAYS Performed at [facility], [place] [address] [place], [place] [address]    Report Status 03/22/2020 FINAL  Final  Blood Culture (routine x 2)     Status: None   Collection Time: 03/17/20  9:14 AM    Specimen: BLOOD  Result Value Ref Range Status   Specimen Description   Final    BLOOD RIGHT ANTECUBITAL Performed at [facility] [address], [place], [place] [address]    Special Requests   Final    BOTTLES DRAWN AEROBIC AND ANAEROBIC Blood Culture results may not be optimal due to an excessive volume of blood received in culture bottles Performed at [facility] [address] [place],  [address]    Culture   Final    NO GROWTH 5 DAYS Performed at [facility],  [address] [place], [place] [address]    Report Status 03/22/2020 FINAL  Final  Urine culture     Status: Abnormal   Collection Time: 03/17/20  9:48 AM   Specimen: Urine, Clean Catch  Result Value Ref Range Status   Specimen Description   Final    URINE, CLEAN CATCH Performed at [facility], [place] [address] [place], [place] [address]    Special Requests   Final    NONE Performed at [facility], [place] [address], [place], [place] [address]    Culture >=100,000 COLONIES/mL ESCHERICHIA COLI (A)  Final   Report Status 03/19/2020 FINAL  Final   Organism ID, Bacteria ESCHERICHIA COLI (A)  Final      Susceptibility   Escherichia coli - MIC*    AMPICILLIN <=2 SENSITIVE Sensitive     CEFAZOLIN <=4 SENSITIVE Sensitive     CEFTRIAXONE <=0.25 SENSITIVE Sensitive     CIPROFLOXACIN <=0.25 SENSITIVE Sensitive     GENTAMICIN <=1 SENSITIVE Sensitive     IMIPENEM <=0.25 SENSITIVE Sensitive     NITROFURANTOIN <=16 SENSITIVE Sensitive     TRIMETH/SULFA <=20 SENSITIVE Sensitive     AMPICILLIN/SULBACTAM <=2 SENSITIVE Sensitive     PIP/TAZO <=4 SENSITIVE Sensitive     * >=100,000 COLONIES/mL ESCHERICHIA COLI  SARS Coronavirus 2 by RT PCR (hospital order, performed in [facility] hospital lab) Nasopharyngeal Nasopharyngeal Swab     Status: None   Collection Time: 03/17/20  9:58 PM   Specimen: Nasopharyngeal Swab  Result Value Ref Range  Status   SARS Coronavirus 2 NEGATIVE NEGATIVE Final    Comment: (NOTE) SARS-CoV-2 target nucleic acids are NOT DETECTED.  The SARS-CoV-2 RNA is generally detectable in upper and lower respiratory specimens during the acute phase of infection. The lowest concentration of SARS-CoV-2 viral copies this assay can detect is 250 copies / mL. A negative result does not preclude SARS-CoV-2 infection and should not be used as the sole basis for treatment or other patient management decisions.  A negative result may occur with improper specimen collection / handling, submission of specimen other than nasopharyngeal swab, presence of viral mutation(s) within the areas targeted by this assay, and inadequate number of viral copies (<250 copies / mL). A negative result must be combined with clinical observations, patient history, and epidemiological information.  Fact Sheet for Patients:   StrictlyIdeas.no  Fact Sheet for Healthcare Providers: BankingDealers.co.za  This test is not yet approved or  cleared by the [place] FDA and has been authorized for detection and/or diagnosis of SARS-CoV-2 by FDA under an Emergency Use Authorization (EUA).  This EUA will remain in effect (meaning this test can be used) for the duration of the COVID-19 declaration under Section 564(b)(1) of the Act, 21 U.S.C. section 360bbb-3(b)(1), unless the authorization is terminated or revoked sooner.  Performed at [facility], [place] [address], [place], [place] [address]   Surgical pcr screen     Status: None   Collection Time: 03/19/20  3:20 AM   Specimen: Nasal Mucosa; Nasal Swab  Result Value Ref Range Status   MRSA, PCR NEGATIVE NEGATIVE Final   Staphylococcus aureus NEGATIVE NEGATIVE Final    Comment: (NOTE) The Xpert SA Assay (FDA approved for NASAL specimens in patients 75 years of age and older), is one component of a  comprehensive surveillance program. It is not intended to diagnose infection nor to guide or monitor treatment. Performed at [facility], [place]  [address], [place], [place] [address]      Labs: BNP (last 3 results) No results for input(s): BNP in the last 8760 hours. Basic Metabolic Panel: Recent Labs  Lab 03/18/20 0548 03/18/20 0548 03/19/20 0504 03/20/20 0442 03/21/20 0508 03/22/20 0541 03/24/20 0500  NA 132*   < > 139 140 136 143 140  K 3.7   < > 3.4* 3.7 3.6 3.5 3.4*  CL 104   < > 107 107 104 108 106  CO2 21*   < > 25 27 25 25 27   GLUCOSE 83   < > 76 83 91 85 114*  BUN 10   < > 10 7 <5* <5* 9  CREATININE 0.47   < > 0.46 0.55 0.45 0.49 0.53  CALCIUM 8.5*   < > 8.3* 8.3* 8.5* 8.5* 8.7*  MG 2.0  --   --   --   --   --   --    < > = values in this interval not displayed.   Liver Function Tests: Recent Labs  Lab 03/18/20 0548 03/20/20 0442 03/21/20 0508  AST 20 15 15   ALT 13 11 11   ALKPHOS 62 42 48  BILITOT 0.5 0.5 0.6  PROT 6.2* 4.9* 5.7*  ALBUMIN 3.2* 2.8* 3.0*   Recent Labs  Lab 03/20/20 0442 03/21/20 0508 03/22/20 0541 03/23/20 0528 03/24/20 0500  LIPASE 189* 208* 173* 143* 174*   No results for input(s): AMMONIA in the last 168 hours. CBC: Recent Labs  Lab 03/18/20 0548 03/19/20 0504 03/20/20 0442 03/22/20 0541 03/24/20 0500  WBC 15.7* 8.1 7.5 8.0 8.3  HGB 15.9* 12.5 12.2 13.1 12.7  HCT 48.1* 38.5 38.5 40.9 40.8  MCV 95.4 95.8 98.2 97.6 98.8  PLT 362 300 279 307 302   Cardiac Enzymes: No results for input(s): CKTOTAL, CKMB, CKMBINDEX, TROPONINI in the last 168 hours. BNP: Invalid input(s): POCBNP CBG: No results for input(s): GLUCAP in the last 168 hours. D-Dimer No results for input(s): DDIMER in the last 72 hours. Hgb A1c No results for input(s): HGBA1C in the last 72 hours. Lipid Profile No results for input(s): CHOL, HDL, LDLCALC, TRIG, CHOLHDL, LDLDIRECT in the last 72 hours. Thyroid function studies No  results for input(s): TSH, T4TOTAL, T3FREE, THYROIDAB in the last 72 hours.  Invalid input(s): FREET3 Anemia work up No results for input(s): VITAMINB12, FOLATE, FERRITIN, TIBC, IRON, RETICCTPCT in the last 72 hours. Urinalysis    Component Value Date/Time   COLORURINE YELLOW 03/17/2020 0948   APPEARANCEUR HAZY (A) 03/17/2020 0948   LABSPEC 1.011 03/17/2020 0948   PHURINE 7.0 03/17/2020 0948   GLUCOSEU NEGATIVE 03/17/2020 0948   HGBUR NEGATIVE 03/17/2020 0948   BILIRUBINUR NEGATIVE 03/17/2020 0948   BILIRUBINUR negative 02/18/2020 1106   KETONESUR 20 (A) 03/17/2020 0948   PROTEINUR 30 (A) 03/17/2020 0948   UROBILINOGEN 0.2 02/18/2020 1106   NITRITE NEGATIVE 03/17/2020 0948   LEUKOCYTESUR NEGATIVE 03/17/2020 0948   Sepsis Labs Invalid input(s): PROCALCITONIN,  WBC,  LACTICIDVEN Microbiology Recent Results (from the past 240 hour(s))  Blood Culture (routine x 2)     Status: None   Collection Time: 03/17/20  8:17 AM   Specimen: BLOOD LEFT HAND  Result Value Ref Range Status   Specimen Description   Final    BLOOD LEFT HAND Performed at [facility], [place] [address], [place],  [address]    Special Requests   Final    BOTTLES DRAWN AEROBIC AND ANAEROBIC Blood Culture results may  not be optimal due to an inadequate volume of blood received in culture bottles Performed at [facility] [address], [place], [place] [address]    Culture   Final    NO GROWTH 5 DAYS Performed at [facility], [place] [address], [place], [place] [address]    Report Status 03/22/2020 FINAL  Final  Blood Culture (routine x 2)     Status: None   Collection Time: 03/17/20  9:14 AM   Specimen: BLOOD  Result Value Ref Range Status   Specimen Description   Final    BLOOD RIGHT ANTECUBITAL Performed at [facility] [address], [place], [place] [address]    Special Requests   Final    BOTTLES DRAWN AEROBIC AND ANAEROBIC Blood  Culture results may not be optimal due to an excessive volume of blood received in culture bottles Performed at [facility] [address], [place], [place] [address]    Culture   Final    NO GROWTH 5 DAYS Performed at [facility], [place] [address] [place], [place] [address]    Report Status 03/22/2020 FINAL  Final  Urine culture     Status: Abnormal   Collection Time: 03/17/20  9:48 AM   Specimen: Urine, Clean Catch  Result Value Ref Range Status   Specimen Description   Final    URINE, CLEAN CATCH Performed at [facility], [place] [address], [place], [place] [address]    Special Requests   Final    NONE Performed at [facility], [place] [address] [place], [place] [address]    Culture >=100,000 COLONIES/mL ESCHERICHIA COLI (A)  Final   Report Status 03/19/2020 FINAL  Final   Organism ID, Bacteria ESCHERICHIA COLI (A)  Final      Susceptibility   Escherichia coli - MIC*    AMPICILLIN <=2 SENSITIVE Sensitive     CEFAZOLIN <=4 SENSITIVE Sensitive     CEFTRIAXONE <=0.25 SENSITIVE Sensitive     CIPROFLOXACIN <=0.25 SENSITIVE Sensitive     GENTAMICIN <=1 SENSITIVE Sensitive     IMIPENEM <=0.25 SENSITIVE Sensitive     NITROFURANTOIN <=16 SENSITIVE Sensitive     TRIMETH/SULFA <=20 SENSITIVE Sensitive     AMPICILLIN/SULBACTAM <=2 SENSITIVE Sensitive     PIP/TAZO <=4 SENSITIVE Sensitive     * >=100,000 COLONIES/mL ESCHERICHIA COLI  SARS Coronavirus 2 by RT PCR (hospital order, performed in [facility] hospital lab) Nasopharyngeal Nasopharyngeal Swab     Status: None   Collection Time: 03/17/20  9:58 PM   Specimen: Nasopharyngeal Swab  Result Value Ref Range Status   SARS Coronavirus 2 NEGATIVE NEGATIVE Final    Comment: (NOTE) SARS-CoV-2 target nucleic acids are NOT DETECTED.  The SARS-CoV-2 RNA is generally detectable in upper and lower respiratory specimens during the acute phase of infection. The  lowest concentration of SARS-CoV-2 viral copies this assay can detect is 250 copies / mL. A negative result does not preclude SARS-CoV-2 infection and should not be used as the sole basis for treatment or other patient management decisions.  A negative result may occur with improper specimen collection / handling, submission of specimen other than nasopharyngeal swab, presence of viral mutation(s) within the areas targeted by this assay, and inadequate number of viral copies (<250 copies / mL). A negative result must be combined with clinical observations, patient history, and epidemiological information.  Fact Sheet for Patients:   StrictlyIdeas.no  Fact Sheet  for Healthcare Providers: BankingDealers.co.za  This test is not yet approved or  cleared by the [place] and has been authorized for detection and/or diagnosis of SARS-CoV-2 by FDA under an Emergency Use Authorization (EUA).  This EUA will remain in effect (meaning this test can be used) for the duration of the COVID-19 declaration under Section 564(b)(1) of the Act, 21 U.S.C. section 360bbb-3(b)(1), unless the authorization is terminated or revoked sooner.  Performed at [facility], [place] [address], [place], [place] [address]   Surgical pcr screen     Status: None   Collection Time: 03/19/20  3:20 AM   Specimen: Nasal Mucosa; Nasal Swab  Result Value Ref Range Status   MRSA, PCR NEGATIVE NEGATIVE Final   Staphylococcus aureus NEGATIVE NEGATIVE Final    Comment: (NOTE) The Xpert SA Assay (FDA approved for NASAL specimens in patients 79 years of age and older), is one component of a comprehensive surveillance program. It is not intended to diagnose infection nor to guide or monitor treatment. Performed at [facility], [place] [address], [place], [place] [address]     Discharge Instructions     Discharge Instructions     Diet clear liquid   Complete by: As directed    Discharge instructions   Complete by: As directed    -Continue a clear liquid diet for the next 5 to 7 days and advance her diet to soft diet as tolerated after this -Follow-up with general surgery for consideration of bladder removal this will be arranged -Depending upon your biopsy results, you will be contacted by hematology/oncology to have an appointment set up -Get lab work in 1 week and follow-up with your primary care physician -Continue taking your home medications as prescribed  -start taking Actigall as prescribed If you have any significant change or worsening of your symptoms, do not hesitate to contact your primary care physician or return to the ED.   Increase activity slowly   Complete by: As directed    No wound care   Complete by: As directed      Allergies as of 03/24/2020      Reactions   Dilaudid [hydromorphone] Other (See Comments)   Hallucinations   Entresto [sacubitril-valsartan] Swelling, Rash   Welts, possible angioedema   Lopressor [metoprolol Tartrate] Swelling   Angioedema   Allegra Allergy [fexofenadine Hcl] Hives   Nsaids    On Xarelto   Gabapentin (once-daily) Rash   Lipitor [atorvastatin] Diarrhea      Medication List    STOP taking these medications   famotidine 20 MG tablet Commonly known as: Pepcid   omeprazole 40 MG capsule Commonly known as: PRILOSEC     TAKE these medications   acetaminophen 325 MG tablet Commonly known as: TYLENOL Take 2 tablets (650 mg total) by mouth every 6 (six) hours as needed for mild pain or moderate pain (or Fever >/= 101).   azelastine 0.1 % nasal spray Commonly known as: ASTELIN Place 1-2 sprays into both nostrils 2 (two) times daily as needed for rhinitis. Use in each nostril as directed   baclofen 10 MG tablet Commonly known as: LIORESAL Take 10 mg by mouth in the morning and at bedtime.   calcium carbonate 500 MG chewable tablet Commonly known  as: TUMS - dosed in mg elemental calcium Chew 1 tablet by mouth 2 (two) times daily.   carvedilol 3.125 MG tablet Commonly known as: COREG Take 1 tablet (3.125 mg total) by mouth  in the morning and at bedtime.   cholecalciferol 1000 units tablet Commonly known as: VITAMIN D Take 1,000 Units by mouth daily.   ferrous sulfate 325 (65 FE) MG tablet Take 325 mg by mouth 2 (two) times a week. Mondays  and Thursdays.   hydrOXYzine 10 MG tablet Commonly known as: ATARAX/VISTARIL Take 1 tablet (10 mg total) by mouth at bedtime.   levocetirizine 5 MG tablet Commonly known as: XYZAL Take 1 tablet (5 mg total) by mouth every evening.   ondansetron 4 MG disintegrating tablet Commonly known as: Zofran ODT Take 1 tablet (4 mg total) by mouth every 8 (eight) hours as needed for nausea or vomiting.   pantoprazole 40 MG tablet Commonly known as: PROTONIX Take 40 mg by mouth daily.   pravastatin 20 MG tablet Commonly known as: PRAVACHOL Take 1 tablet (20 mg total) by mouth every evening.   rivaroxaban 20 MG Tabs tablet Commonly known as: Xarelto Take 1 tablet daily with evening meal What changed:   how much to take  how to take this  when to take this  additional instructions   tamsulosin 0.4 MG Caps capsule Commonly known as: FLOMAX Take 1 capsule (0.4 mg total) by mouth daily.   traMADol 50 MG tablet Commonly known as: ULTRAM TAKE 1 TO 2 TABLETS BY MOUTH EVERY 8 HOURS AS NEEDED FOR MODERATE PAIN What changed: See the new instructions.   ursodiol 300 MG capsule Commonly known as: ACTIGALL Take 1 capsule (300 mg total) by mouth 2 (two) times daily.   vitamin B-12 100 MCG tablet Commonly known as: CYANOCOBALAMIN Take 100 mcg by mouth daily.       Follow-up Information    Armandina Gemma, MD Follow up on 04/08/2020.   Specialty: General Surgery Why: Your appointment is at 10:30 AM.  Be at the office 30 minutes early for check in. Bring photo ID and insurance information.    Contact information: [address] [place] [address] [phone]              Allergies  Allergen Reactions  . Dilaudid [Hydromorphone] Other (See Comments)    Hallucinations  . Entresto [Sacubitril-Valsartan] Swelling and Rash    Welts, possible angioedema   . Lopressor [Metoprolol Tartrate] Swelling    Angioedema  . Allegra Allergy [Fexofenadine Hcl] Hives  . Nsaids     On Xarelto  . Gabapentin (Once-Daily) Rash  . Lipitor [Atorvastatin] Diarrhea    Dispo: The patient is from: Home              Anticipated d/c is to: Home              Anticipated d/c date is Today              Patient currently is medically stable to d/c.       Time coordinating discharge: Over 30 minutes   SIGNED:   Harold Hedge, D.O. [facility] Pager: [phone]  03/24/2020, 1:47 PM

## 2020-03-24 NOTE — Progress Notes (Signed)
I was notified of abnormal CT finding per hospitalist. Biopsy pending. Asking for outpatient f/u with oncology. Will f/u on biopsy and make outpatient referral to Oncology if indicated pending results.

## 2020-03-24 NOTE — Progress Notes (Signed)
[facility] Progress Note  5 Days Post-Op  Subjective: Patient would like to go home and follow up to plan cholecystectomy. She denies abdominal pain or nausea this AM. She is willing to follow any specific diet recommendations at home. I told her I will discuss with primary team but that she is ok to go home from a surgery standpoint.   Objective: Vital signs in last 24 hours: Temp:  [97.4 F (36.3 C)-98.3 F (36.8 C)] 97.9 F (36.6 C) (06/29 0525) Pulse Rate:  [62-92] 74 (06/29 0525) Resp:  [11-19] 19 (06/29 0525) BP: (81-133)/(52-83) 117/72 (06/29 0654) SpO2:  [95 %-100 %] 98 % (06/29 0525) Weight:  [51 kg] 51 kg (06/29 0708) Last BM Date: 03/23/20  Intake/Output from previous day: 06/28 0701 - 06/29 0700 In: 3337.8 [P.O.:960; I.V.:2377.8] Out: 0  Intake/Output this shift: No intake/output data recorded.  PE: General: pleasant, WD, thin female who is laying in bed in NAD HEENT: Sclera are anicteric.  PERRL.  Ears and nose without any masses or lesions.  Mouth is pink and moist Heart: regular, rate, and rhythm. Palpable radial and pedal pulses bilaterally Lungs: CTAB, no wheezes, rhonchi, or rales noted.  Respiratory effort nonlabored Abd: soft, no ttp, less fullness in epigastrium, +BS   Lab Results:  Recent Labs    03/22/20 0541 03/24/20 0500  WBC 8.0 8.3  HGB 13.1 12.7  HCT 40.9 40.8  PLT 307 302   BMET Recent Labs    03/22/20 0541 03/24/20 0500  NA 143 140  K 3.5 3.4*  CL 108 106  CO2 25 27  GLUCOSE 85 114*  BUN <5* 9  CREATININE 0.49 0.53  CALCIUM 8.5* 8.7*   PT/INR No results for input(s): LABPROT, INR in the last 72 hours. CMP     Component Value Date/Time   NA 140 03/24/2020 0500   NA 139 09/24/2019 1535   K 3.4 (L) 03/24/2020 0500   CL 106 03/24/2020 0500   CO2 27 03/24/2020 0500   GLUCOSE 114 (H) 03/24/2020 0500   BUN 9 03/24/2020 0500   BUN 12 09/24/2019 1535   CREATININE 0.53 03/24/2020 0500   CREATININE 0.79  01/08/2018 1615   CALCIUM 8.7 (L) 03/24/2020 0500   PROT 5.7 (L) 03/21/2020 0508   PROT 7.2 09/24/2019 1535   ALBUMIN 3.0 (L) 03/21/2020 0508   ALBUMIN 4.2 09/24/2019 1535   AST 15 03/21/2020 0508   ALT 11 03/21/2020 0508   ALKPHOS 48 03/21/2020 0508   BILITOT 0.6 03/21/2020 0508   BILITOT <0.2 09/24/2019 1535   GFRNONAA >60 03/24/2020 0500   GFRAA >60 03/24/2020 0500   Lipase     Component Value Date/Time   LIPASE 174 (H) 03/24/2020 0500       Studies/Results: NM Bone Scan Whole Body  Result Date: 03/23/2020 CLINICAL DATA:  Bone lesion at greater trochanter RIGHT femur concern for pathologic fracture question malignancy EXAM: NUCLEAR MEDICINE WHOLE BODY BONE SCAN TECHNIQUE: Whole body anterior and posterior images were obtained approximately 3 hours after intravenous injection of radiopharmaceutical. RADIOPHARMACEUTICALS:  20.9 mCi Technetium-43m MDP IV COMPARISON:  None Correlation: MRI RIGHT femur 11/20/2019, CT abdomen and pelvis 03/17/2020 FINDINGS: Focal abnormal increased tracer accumulation is seen at the RIGHT greater trochanter corresponding to lesion identified on MR. Additional focus of abnormal tracer uptake is seen at a lower lateral LEFT rib approximately ninth; this corresponds to a healing fracture on CT. Focus of uptake in the lower cervical spine at approximately C6 or C7  of uncertain etiology. No additional sites of abnormal osseous tracer accumulation are identified. Expected urinary tract and soft tissue distribution of tracer. IMPRESSION: Abnormal uptake at RIGHT greater trochanter corresponding to abnormality on MR, could represent a lytic bone lesion or combination of both lesion and pathologic fracture. Uptake at healing lateral LEFT ninth rib fracture. Nonspecific uptake in lower cervical spine at C6 or C7 of uncertain etiology; consider MR assessment. Electronically Signed   By: Lavonia Dana M.D.   On: 03/23/2020 15:36   CT BIOPSY  Result Date:  03/23/2020 INDICATION: 59 year old with fracture of the right femoral greater trochanter. This is concerning for a pathologic fracture and request for biopsy. EXAM: CT-GUIDED BIOPSY OF RIGHT FEMORAL LESION MEDICATIONS: None. ANESTHESIA/SEDATION: Moderate (conscious) sedation was employed during this procedure. A total of Versed 2.0 mg and Fentanyl 50 mcg was administered intravenously. Moderate Sedation Time: 10 minutes. The patient's level of consciousness and vital signs were monitored continuously by radiology nursing throughout the procedure under my direct supervision. FLUOROSCOPY TIME:  None COMPLICATIONS: None immediate. PROCEDURE: Informed written consent was obtained from the patient after a thorough discussion of the procedural risks, benefits and alternatives. All questions were addressed. A timeout was performed prior to the initiation of the procedure. Patient was placed in a left lateral decubitus position. Images through the pelvis were obtained. The right greater trochanter was identified and targeted. The overlying skin was prepped with chlorhexidine and a sterile field was created. Skin and soft tissues were anesthetized with 1% lidocaine. Using CT guidance, a 17 gauge coaxial needle was directed through the fracture cortex into the lucent or soft tissue component of the fracture/lesion. 62 gauge core biopsy obtained but the needles were damaged during the core biopsy and could not be used for additional core biopsies. A new 17 gauge needle was directed into the soft tissue component and additional core biopsies were obtained with an 18 gauge core device. Specimens placed in formalin. Bandage placed over the puncture site. FINDINGS: Evidence for fracture involving the right femoral greater trochanter. There is a lucent lesion associated with the greater trochanter. Needle was directed through the fracture cortex into the soft tissue component. Small soft tissue core biopsies were obtained.  IMPRESSION: CT-guided core biopsy of the right femoral greater trochanter lesion. Electronically Signed   By: Markus Daft M.D.   On: 03/23/2020 15:37    Anti-infectives: Anti-infectives (From admission, onward)   Start     Dose/Rate Route Frequency Ordered Stop   03/17/20 1100  cefTRIAXone (ROCEPHIN) 1 g in sodium chloride 0.9 % 100 mL IVPB        1 g 200 mL/hr over 30 Minutes Intravenous  Once 03/17/20 1046 03/17/20 1551       Assessment/Plan HTN HLD 3.3 cm infrarenal abdominal aortic aneurysm- outpatient follow up Hx dCHF A. 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 - lipase still mildly elevated at 173 this AM but clinically patient tolerating diet and pain is better  - cardiology has evaluated and determined she has some elevated cardiac risk but that it is not prohibitive for surgery  - ok to discharge from a surgery standpoint and have patient follow up in the office  to schedule cholecystectomy in the near future - continue actigall in the meantime  Lytic Lesion of the greater trochanter of the proximal right femur concerning for possible pathologic fracture - CT biopsy done 6/28  FEN -soft VTE -SCDs, okay for heparin from our standpoint ID -Rocephin x 1 for UTI; no current abx  LOS: 7 days    Norm Parcel , [facility] 03/24/2020, 9:33 AM Please see Amion for pager number during day hours 7:00am-4:30pm

## 2020-03-24 NOTE — Progress Notes (Signed)
Discharge instructions discussed with patient and family verbalized agreement and understanding

## 2020-03-25 ENCOUNTER — Telehealth: Payer: Self-pay

## 2020-03-25 LAB — SURGICAL PATHOLOGY

## 2020-03-25 NOTE — Telephone Encounter (Signed)
Transition Care Management Follow-up Telephone Call  Admission: 03/17/2020-03/24/2020 Diagnosis: Acute chronic pancreatitis    How have you been since you were released from the hospital? "I'm weak"     Do you understand why you were in the hospital? yes   Do you understand the discharge instructions? yes   Where were you discharged to? Home. Resides with spouse.    Items Reviewed:  Medications reviewed: yes  Allergies reviewed: yes  Dietary changes reviewed: yes, pt reports continuing clear diet to reduce lipase level in order to have surgery.   Referrals reviewed: yes   Functional Questionnaire:   Activities of Daily Living (ADLs):   She states they are independent in the following: ambulation, bathing and hygiene, feeding, continence, grooming, toileting and dressing States they require assistance with the following: None   Any transportation issues/concerns?: no   Any patient concerns? no   Confirmed importance and date/time of follow-up visits scheduled yes  Provider Appointment booked with PCP on 04/06/2020.   Confirmed with patient if condition begins to worsen call PCP or go to the ER.  Patient was given the office number and encouraged to call back with question or concerns.  : yes

## 2020-03-26 LAB — MULTIPLE MYELOMA PANEL, SERUM
Albumin SerPl Elph-Mcnc: 3.3 g/dL (ref 2.9–4.4)
Albumin/Glob SerPl: 1.2 (ref 0.7–1.7)
Alpha 1: 0.3 g/dL (ref 0.0–0.4)
Alpha2 Glob SerPl Elph-Mcnc: 0.8 g/dL (ref 0.4–1.0)
B-Globulin SerPl Elph-Mcnc: 1.2 g/dL (ref 0.7–1.3)
Gamma Glob SerPl Elph-Mcnc: 0.6 g/dL (ref 0.4–1.8)
Globulin, Total: 2.9 g/dL (ref 2.2–3.9)
IgA: 313 mg/dL (ref 87–352)
IgG (Immunoglobin G), Serum: 666 mg/dL (ref 586–1602)
IgM (Immunoglobulin M), Srm: 89 mg/dL (ref 26–217)
Total Protein ELP: 6.2 g/dL (ref 6.0–8.5)

## 2020-03-27 ENCOUNTER — Ambulatory Visit (INDEPENDENT_AMBULATORY_CARE_PROVIDER_SITE_OTHER): Payer: 59 | Admitting: *Deleted

## 2020-03-27 DIAGNOSIS — I63512 Cerebral infarction due to unspecified occlusion or stenosis of left middle cerebral artery: Secondary | ICD-10-CM

## 2020-03-27 LAB — CUP PACEART REMOTE DEVICE CHECK
Date Time Interrogation Session: 20210701231543
Implantable Pulse Generator Implant Date: 20181203

## 2020-03-31 NOTE — Progress Notes (Signed)
[facility] Summary Report / Loop Recorder 

## 2020-04-02 ENCOUNTER — Other Ambulatory Visit: Payer: Self-pay | Admitting: Student

## 2020-04-02 ENCOUNTER — Other Ambulatory Visit: Payer: Self-pay | Admitting: *Deleted

## 2020-04-02 ENCOUNTER — Other Ambulatory Visit: Payer: Self-pay | Admitting: Family Medicine

## 2020-04-02 DIAGNOSIS — I6523 Occlusion and stenosis of bilateral carotid arteries: Secondary | ICD-10-CM

## 2020-04-02 NOTE — Telephone Encounter (Signed)
Sent in # 14 to get patient to her next OV w/ Dr Raoul Pitch 04/09/20.

## 2020-04-02 NOTE — Telephone Encounter (Signed)
Pt notified that 3 sample bottles of Xarelto 20 mg placed at front desk.

## 2020-04-02 NOTE — Telephone Encounter (Signed)
Pt is requesting samples of Xarelto 20mg    Please call [phone]   thanks renee

## 2020-04-06 ENCOUNTER — Inpatient Hospital Stay: Payer: 59 | Admitting: Family Medicine

## 2020-04-07 ENCOUNTER — Telehealth: Payer: Self-pay

## 2020-04-07 NOTE — Telephone Encounter (Signed)
Patient had biopsy done at the end of June and has not heard the result of it yet/  Patient has an appt scheduled with Dr. Raoul Pitch on 04/09/20  Patient can be reached at [phone]

## 2020-04-08 ENCOUNTER — Telehealth: Payer: Self-pay | Admitting: *Deleted

## 2020-04-08 ENCOUNTER — Telehealth: Payer: Self-pay

## 2020-04-08 NOTE — Telephone Encounter (Signed)
Patient with diagnosis of afib on Xarelto for anticoagulation.    Procedure: cholecystectomy  Date of procedure: TBD  CHADS2-VASc score of 6 (sex, CHF, HTN, CAD, CVA in 2018)  CrCl 17mL/min Platelet count 302K  Recommend that pt only hold Xarelto for 1 day prior to procedure due to elevated cardiac risk. Anticipate adequate clearance in that time given normal renal function.   If > 24 hour hold is required, would need to defer to managing MD, Dr Domenic Polite. Cards consult during recent hospitalization from Dr Oval Linsey states that for inpatient management, "Okay to continue holding Xarelto preoperatively. Would start heparin preoperatively unless contraindicated by the surgical or primary teams." Bridge would be needed outpatient only if > 24 hour hold is required by MD performing procedure.

## 2020-04-08 NOTE — Telephone Encounter (Signed)
Surgical clearance letter from [facility] for pt to have Cholecystectomy. Placed on Providers desk to review. Pt has upcoming appt on 04/09/2020 with PCP.

## 2020-04-08 NOTE — Telephone Encounter (Signed)
Pt has appt tomorrow with Dr Raoul Pitch- Dr Raoul Pitch is on vacation and unable to review results until her return

## 2020-04-08 NOTE — Telephone Encounter (Signed)
   [name]    HEARTCARE STAFF: - Please ensure there is not already an duplicate clearance open for this procedure. - Under Visit Info/Reason for Call, type in Other and utilize the format Clearance MM/DD/YY or Clearance TBD. Do not use dashes or single digits. - If request is for dental extraction, please clarify the # of teeth to be extracted.  Request for surgical clearance:  1. What type of surgery is being performed?  CHOLECYSTECTOMY   2. When is this surgery scheduled?  TBD   3. What type of clearance is required (medical clearance vs. Pharmacy clearance to hold med vs. Both)?  BOTH  4. Are there any medications that need to be held prior to surgery and how long? [place]   5. Practice name and name of physician performing surgery?  [facility] / DR. TODD GERKIN   6. What is the office phone number?  [phone]   7.   What is the office fax number?  [phone]  8.   Anesthesia type (None, local, MAC, general) ?  GENERAL   [name] 04/08/2020, 12:53 PM  _________________________________________________________________   (provider comments below)  ]

## 2020-04-08 NOTE — Telephone Encounter (Signed)
   Primary Cardiologist: [name], MD  Chart reviewed as part of pre-operative protocol coverage. Given past medical history and time since last visit, based on ACC/AHA guidelines, [place] would be at acceptable risk for the planned procedure without further cardiovascular testing.   OK to hold Xarelto one day pre op. Per our pharmacy protocol with her normal renal function anticipate adequate clearance in that time period.  If a longer hold is requested Dr Domenic Polite would need to weigh in on potential crossover with Lovenox.   I will route this recommendation to the requesting party via Epic fax function and remove from pre-op pool.  Please call with questions.  Kerin Ransom, PA-C 04/08/2020, 2:19 PM

## 2020-04-09 ENCOUNTER — Ambulatory Visit (HOSPITAL_COMMUNITY)
Admission: RE | Admit: 2020-04-09 | Discharge: 2020-04-09 | Disposition: A | Discharge status: Home / Self Care | Payer: 59 | Source: Ambulatory Visit | Attending: Vascular Surgery | Admitting: Vascular Surgery

## 2020-04-09 ENCOUNTER — Ambulatory Visit (INDEPENDENT_AMBULATORY_CARE_PROVIDER_SITE_OTHER): Payer: 59 | Admitting: Physician Assistant

## 2020-04-09 ENCOUNTER — Encounter: Payer: Self-pay | Admitting: Family Medicine

## 2020-04-09 ENCOUNTER — Other Ambulatory Visit: Payer: Self-pay

## 2020-04-09 ENCOUNTER — Ambulatory Visit (INDEPENDENT_AMBULATORY_CARE_PROVIDER_SITE_OTHER): Payer: 59 | Admitting: Family Medicine

## 2020-04-09 VITALS — BP 108/76 | HR 87 | Temp 97.7°F | Resp 20 | Ht <= 58 in | Wt 105.9 lb

## 2020-04-09 VITALS — BP 103/66 | HR 84 | Temp 98.1°F | Resp 18 | Ht <= 58 in | Wt 104.5 lb

## 2020-04-09 DIAGNOSIS — I6523 Occlusion and stenosis of bilateral carotid arteries: Secondary | ICD-10-CM | POA: Diagnosis present

## 2020-04-09 DIAGNOSIS — Z01818 Encounter for other preprocedural examination: Secondary | ICD-10-CM

## 2020-04-09 DIAGNOSIS — S72114A Nondisplaced fracture of greater trochanter of right femur, initial encounter for closed fracture: Secondary | ICD-10-CM | POA: Insufficient documentation

## 2020-04-09 DIAGNOSIS — K859 Acute pancreatitis without necrosis or infection, unspecified: Secondary | ICD-10-CM

## 2020-04-09 DIAGNOSIS — K828 Other specified diseases of gallbladder: Secondary | ICD-10-CM

## 2020-04-09 DIAGNOSIS — K862 Cyst of pancreas: Secondary | ICD-10-CM

## 2020-04-09 DIAGNOSIS — IMO0002 Reserved for concepts with insufficient information to code with codable children: Secondary | ICD-10-CM

## 2020-04-09 DIAGNOSIS — N200 Calculus of kidney: Secondary | ICD-10-CM

## 2020-04-09 DIAGNOSIS — M899 Disorder of bone, unspecified: Secondary | ICD-10-CM

## 2020-04-09 DIAGNOSIS — N390 Urinary tract infection, site not specified: Secondary | ICD-10-CM

## 2020-04-09 DIAGNOSIS — S72114S Nondisplaced fracture of greater trochanter of right femur, sequela: Secondary | ICD-10-CM

## 2020-04-09 DIAGNOSIS — R7989 Other specified abnormal findings of blood chemistry: Secondary | ICD-10-CM

## 2020-04-09 DIAGNOSIS — Z9289 Personal history of other medical treatment: Secondary | ICD-10-CM

## 2020-04-09 DIAGNOSIS — M898X5 Other specified disorders of bone, thigh: Secondary | ICD-10-CM

## 2020-04-09 HISTORY — DX: Nondisplaced fracture of greater trochanter of right femur, initial encounter for closed fracture: S72.114A

## 2020-04-09 LAB — COMPREHENSIVE METABOLIC PANEL
ALT: 10 U/L (ref 0–35)
AST: 18 U/L (ref 0–37)
Albumin: 4.3 g/dL (ref 3.5–5.2)
Alkaline Phosphatase: 81 U/L (ref 39–117)
BUN: 15 mg/dL (ref 6–23)
CO2: 31 mEq/L (ref 19–32)
Calcium: 9.8 mg/dL (ref 8.4–10.5)
Chloride: 100 mEq/L (ref 96–112)
Creatinine, Ser: 0.68 mg/dL (ref 0.40–1.20)
GFR: 88.27 mL/min (ref >60.00)
Glucose, Bld: 83 mg/dL (ref 70–99)
Potassium: 4.3 mEq/L (ref 3.5–5.1)
Sodium: 137 mEq/L (ref 135–145)
Total Bilirubin: 0.3 mg/dL (ref 0.2–1.2)
Total Protein: 7 g/dL (ref 6.0–8.3)

## 2020-04-09 LAB — CBC WITH DIFFERENTIAL/PLATELET
Basophils Absolute: 0.1 10*3/uL (ref 0.0–0.1)
Basophils Relative: 1.1 % (ref 0.0–3.0)
Eosinophils Absolute: 0.2 10*3/uL (ref 0.0–0.7)
Eosinophils Relative: 2.7 % (ref 0.0–5.0)
HCT: 43.2 % (ref 36.0–46.0)
Hemoglobin: 14.5 g/dL (ref 12.0–15.0)
Lymphocytes Relative: 28.5 % (ref 12.0–46.0)
Lymphs Abs: 2.4 10*3/uL (ref 0.7–4.0)
MCHC: 33.6 g/dL (ref 30.0–36.0)
MCV: 95.3 fl (ref 78.0–100.0)
Monocytes Absolute: 0.8 10*3/uL (ref 0.1–1.0)
Monocytes Relative: 9.8 % (ref 3.0–12.0)
Neutro Abs: 4.8 10*3/uL (ref 1.4–7.7)
Neutrophils Relative %: 57.9 % (ref 43.0–77.0)
Platelets: 396 10*3/uL (ref 150.0–400.0)
RBC: 4.53 Mil/uL (ref 3.87–5.11)
RDW: 13.5 % (ref 11.5–15.5)
WBC: 8.3 10*3/uL (ref 4.0–10.5)

## 2020-04-09 LAB — LIPASE: Lipase: 90 U/L — ABNORMAL HIGH (ref 11.0–59.0)

## 2020-04-09 NOTE — Telephone Encounter (Signed)
Received another fax from [facility], Dr. Tera Helper advising that the pt would need to hold the Xarelto X's 3 days. See Luke's previous clearance not.

## 2020-04-09 NOTE — Progress Notes (Signed)
This visit occurred during the SARS-CoV-2 public health emergency.  Safety protocols were in place, including screening questions prior to the visit, additional usage of staff PPE, and extensive cleaning of exam room while observing appropriate contact time as indicated for disinfecting solutions.     [name] , December 16, 1960, 59 y.o., female MRN: [id number] Patient Care Team    Relationship Specialty Notifications Start End  [name], DO PCP - General Family Medicine  10/13/17   Constance Haw, MD PCP - Electrophysiology Cardiology Admissions 08/28/17   Satira Sark, MD PCP - Cardiology Cardiology Admissions 12/15/17   Rosalin Hawking, MD Consulting Physician Neurology  10/13/17   Jamse Arn, MD Consulting Physician Physical Medicine and Rehabilitation  10/13/17   Luanne Bras, MD Consulting Physician Interventional Radiology  10/13/17   Mansouraty, Telford Nab., MD Consulting Physician Gastroenterology  03/03/20     Chief Complaint  Patient presents with  . Hospitalization Follow-up    Pt is here for the follow up. They did biopsy in the hospital and she never got those results. Pt has surgical paperwork, on PCP desk      Subjective: Pt presents for an [facility] hospital follow up and surgical clearance. [name] has a complicated case of recurrent pancreatitis, pancreatic pseudocyst, GB sludge, history of CVA, right hemiplegia, hyperlipidemia, NSTEMI-status post CABG x2, CAD, hypertension, CHF, A. fib, AAA, nephrolithiasis, right femur lesion of greater trochanter with pathological fracture.  Patient is under the care of [facility] gastroenterology surrounding recurrent pancreatitis and pseudocyst.  CA 19-9 abnormal at   40,  noted during hospital stay.  Pathology results from FNA of pseudocyst showed no malignant cells.  Patient has been on clear liquid diet since hospital discharge until recently.  She reports her surgeon said that she could start eating with  caution on avoiding high fat content in diet.  She states she did tolerate solids yesterday.  Patient has met with Dr. Bing Quarry surgery for consideration of laparoscopic cholecystectomy secondary to recurrent pancreatitis with the presence of gallbladder sludge.  Patient reports she is concerned they are unable to fit her into the surgical schedule for another 6-8 weeks.  She is inquiring if there is a possibility she could be moved up on either Dr. Gala Lewandowsky schedule or one of his colleagues, given her state of chronic recurrent pancreatitis with multiple hospital admissions.  She has continue Actigall prescribed by specialty team.  Per hospital discharge summary, patient was to hear from oncology concerning her bone biopsy results and further plan.  Patient was found to have a right femur lesion of greater trochanter with pathological fracture.  Patient reports she has not heard from oncology on the results or to schedule a follow-up- her urologist did read her the results when she was at a recent appt.  Reviewed pathology report from 03/23/2020-no malignancy was found.  Specimen consistent with bone and fibrovascular tissue.  Multiple myeloma panel was normal without M spike.  CA 19-9 was mildly elevated at 40 during hospital admission. She reports she has a follow-up scheduled later this week with the interventional radiologist and believes they will discuss results with her.  Per EMR review, it does not appear Ortho felt surgical intervention was needed for fracture.   Recurrent UTI.  Patient had E. coli UTI during hospital admission.  She reports her urinary symptoms have resolved.  She was treated with ceftriaxone during hospital admission.  Procedure:Laproscopic cholecystectomy Indication: chronic pancreatitis and gallbladder sludge.  Anesthesia:General  Surgery type risk:   - Low = laparoscopic cholecystectomy.  Prior anesthesia complications: none Family history of prior anesthesia  complications:  Cardiac:    - CHD, PAD, stroke, MI> Cardiac clearance needed, if not already completed. They will also need to provide recommendations on anticoagulation.  Pulmonary: occasional smoker> encouraged her to quit altogether.  Asymptomatic.  Counseled.  Endocrine: Diabetes Obesity:Body mass index is 24.29 kg/m. Chronic kidney disease: N/A Chronic med that needs to be continued: Coreg, Xarelto Anticoagulation: Xarelto> recommendations on holding during surgical procedure per cardiology  Depression screen [facility] 2/9 11/22/2018 10/22/2018 10/22/2018 10/19/2018  Decreased Interest 0 0 0 0  Down, Depressed, Hopeless 0 0 0 0  PHQ - 2 Score 0 0 0 0  Altered sleeping - 0 - -  Tired, decreased energy - 1 - -  Change in appetite - 0 - -  Feeling bad or failure about yourself  - 0 - -  Trouble concentrating - 0 - -  Moving slowly or fidgety/restless - 0 - -  Suicidal thoughts - 0 - -  PHQ-9 Score - 1 - -  Difficult doing work/chores - Not difficult at all - -  Some recent data might be hidden    Allergies  Allergen Reactions  . Dilaudid [Hydromorphone] Other (See Comments)    Hallucinations  . Entresto [Sacubitril-Valsartan] Swelling and Rash    Welts, possible angioedema   . Lopressor [Metoprolol Tartrate] Swelling    Angioedema  . Allegra Allergy [Fexofenadine Hcl] Hives  . Nsaids     On Xarelto  . Gabapentin (Once-Daily) Rash  . Lipitor [Atorvastatin] Diarrhea   Social History   Social History Narrative   Married. 2 children.    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    Walks with cane or wheelchair.    Former smoker.    Drinks caffeine.    Smoke alarm in the home.   Firearms in the home.    Wears her seatbelt.    Feels safe In her relationships.    Past Medical History:  Diagnosis Date  . AAA (abdominal aortic aneurysm) ([place])    a. 3.3cm infrarenal by CT 02/2020.  . Angio-edema   . Arthritis   . CAD (coronary artery disease)    a. s/p NSTEMI in 10/2017 with cath  showing LM disease --> s/p CABG on 11/23/2017 with LIMA-LAD and SVG-OM.   [name] Carotid stenosis    a. Duplex 11/2018 3-84% RICA, 66-59% LICA.  [name] Hay fever   . Ischemic cardiomyopathy    a. EF 35-40% by echo in 10/2017. b. 55% by echo 01/2020.  . NSTEMI (non-ST elevated myocardial infarction) ([place])   . PAF (paroxysmal atrial fibrillation) ([place])    Diagnosed by ILR  . Pancreatitis   . Pneumonia 11/16/2017  . Stroke [facility]) 08/23/2017   Left MCA infarct status post TPA and thrombectomy  . Urticaria    Past Surgical History:  Procedure Laterality Date  . BIOPSY  12/02/2019   Procedure: BIOPSY;  Surgeon: Rush Landmark Telford Nab., MD;  Location: [facility];  Service: Gastroenterology;;  . CORONARY ARTERY BYPASS GRAFT N/A 11/23/2017   Procedure: CORONARY ARTERY BYPASS GRAFTING (CABG) x 2, ON PUMP, USING LEFT INTERNAL MAMMARY ARTERY AND RIGHT GREATER SAPHENOUS VEIN HARVESTED ENDOSCOPICALLY;  Surgeon: Gaye Pollack, MD;  Location: [facility];  Service: Open Heart Surgery;  Laterality: N/A;  . CRYOABLATION     cervical  . ESOPHAGOGASTRODUODENOSCOPY (EGD) WITH PROPOFOL N/A 12/02/2019   Procedure: ESOPHAGOGASTRODUODENOSCOPY (EGD) WITH PROPOFOL;  Surgeon: Irving Copas., MD;  Location: [facility];  Service: Gastroenterology;  Laterality: N/A;  . ESOPHAGOGASTRODUODENOSCOPY (EGD) WITH PROPOFOL N/A 03/19/2020   Procedure: ESOPHAGOGASTRODUODENOSCOPY (EGD) WITH PROPOFOL;  Surgeon: Milus Banister, MD;  Location: [facility];  Service: Endoscopy;  Laterality: N/A;  . EUS N/A 03/19/2020   Procedure: UPPER ENDOSCOPIC ULTRASOUND (EUS) RADIAL;  Surgeon: Milus Banister, MD;  Location: [facility];  Service: Endoscopy;  Laterality: N/A;  . EUS N/A 03/19/2020   Procedure: UPPER ENDOSCOPIC ULTRASOUND (EUS) LINEAR;  Surgeon: Milus Banister, MD;  [facility];  Service: Endoscopy;  Laterality: N/A;  . FINE NEEDLE ASPIRATION  12/02/2019   Procedure: FINE NEEDLE ASPIRATION (FNA) LINEAR;  Surgeon:  Irving Copas., MD;  Location: [facility];  Service: Gastroenterology;;  . FINE NEEDLE ASPIRATION N/A 03/19/2020   Procedure: FINE NEEDLE ASPIRATION (FNA) LINEAR;  Surgeon: Milus Banister, MD;  [facility];  Service: Endoscopy;  Laterality: N/A;  . IR ANGIO INTRA EXTRACRAN SEL COM CAROTID INNOMINATE UNI R MOD SED  08/21/2017  . IR ANGIO VERTEBRAL SEL SUBCLAVIAN INNOMINATE UNI R MOD SED  08/21/2017  . IR PERCUTANEOUS ART THROMBECTOMY/INFUSION INTRACRANIAL INC DIAG ANGIO  08/21/2017  . IR RADIOLOGIST EVAL & MGMT  10/30/2017  . LOOP RECORDER INSERTION N/A 08/28/2017   Procedure: LOOP RECORDER INSERTION;  Surgeon: Constance Haw, MD;  Location: [facility];  Service: Cardiovascular;  Laterality: N/A;  . RADIOLOGY WITH ANESTHESIA N/A 08/21/2017   Procedure: RADIOLOGY WITH ANESTHESIA;  Surgeon: Luanne Bras, MD;  Location: [facility];  Service: Radiology;  Laterality: N/A;  . RIGHT/LEFT HEART CATH AND CORONARY ANGIOGRAPHY N/A 11/20/2017   Procedure: RIGHT/LEFT HEART CATH AND CORONARY ANGIOGRAPHY;  Surgeon: Jettie Booze, MD;  Location: [facility];  Service: Cardiovascular;  Laterality: N/A;  . TEE WITHOUT CARDIOVERSION N/A 08/28/2017   Procedure: TRANSESOPHAGEAL ECHOCARDIOGRAM (TEE);  Surgeon: Fay Records, MD;  Location: [facility];  Service: Cardiovascular;  Laterality: N/A;  . TEE WITHOUT CARDIOVERSION N/A 11/23/2017   Procedure: TRANSESOPHAGEAL ECHOCARDIOGRAM (TEE);  Surgeon: Gaye Pollack, MD;  Location: [facility];  Service: Open Heart Surgery;  Laterality: N/A;  . TUBAL LIGATION    . UPPER ESOPHAGEAL ENDOSCOPIC ULTRASOUND (EUS) N/A 12/02/2019   Procedure: UPPER ESOPHAGEAL ENDOSCOPIC ULTRASOUND (EUS);  Surgeon: Irving Copas., MD;  Location: [facility];  Service: Gastroenterology;  Laterality: N/A;   Family History  Problem Relation Age of Onset  . Heart attack Father   . Hypertension Mother   . Hyperlipidemia Mother   . Diabetes type  II Mother   . Urticaria Mother   . Hypertension Brother   . Breast cancer Cousin 81  . Immunodeficiency Neg Hx   . Eczema Neg Hx   . Atopy Neg Hx   . Asthma Neg Hx   . Angioedema Neg Hx   . Allergic rhinitis Neg Hx   . Colon cancer Neg Hx   . Esophageal cancer Neg Hx   . Stomach cancer Neg Hx   . Pancreatic cancer Neg Hx   . Colon polyps Neg Hx   . Esophageal varices Neg Hx   . Inflammatory bowel disease Neg Hx   . Rectal cancer Neg Hx    Allergies as of 04/09/2020      Reactions   Dilaudid [hydromorphone] Other (See Comments)   Hallucinations   Entresto [sacubitril-valsartan] Swelling, Rash   Welts, possible angioedema   Lopressor [metoprolol Tartrate] Swelling   Angioedema   Allegra Allergy [  fexofenadine Hcl] Hives   Nsaids    On Xarelto   Gabapentin (once-daily) Rash   Lipitor [atorvastatin] Diarrhea      Medication List       Accurate as of April 09, 2020 11:47 AM. If you have any questions, ask your nurse or doctor.        acetaminophen 325 MG tablet Commonly known as: TYLENOL Take 2 tablets (650 mg total) by mouth every 6 (six) hours as needed for mild pain or moderate pain (or Fever >/= 101).   azelastine 0.1 % nasal spray Commonly known as: ASTELIN Place 1-2 sprays into both nostrils 2 (two) times daily as needed for rhinitis. Use in each nostril as directed   baclofen 10 MG tablet Commonly known as: LIORESAL Take 10 mg by mouth in the morning and at bedtime.   calcium carbonate 500 MG chewable tablet Commonly known as: TUMS - dosed in mg elemental calcium Chew 1 tablet by mouth 2 (two) times daily.   carvedilol 3.125 MG tablet Commonly known as: COREG Take 1 tablet (3.125 mg total) by mouth in the morning and at bedtime.   cholecalciferol 1000 units tablet Commonly known as: VITAMIN D Take 1,000 Units by mouth daily.   ferrous sulfate 325 (65 FE) MG tablet Take 325 mg by mouth 2 (two) times a week. Mondays  and Thursdays.   hydrOXYzine 10 MG  tablet Commonly known as: ATARAX/VISTARIL Take 1 tablet (10 mg total) by mouth at bedtime.   levocetirizine 5 MG tablet Commonly known as: XYZAL Take 1 tablet (5 mg total) by mouth every evening.   ondansetron 4 MG disintegrating tablet Commonly known as: Zofran ODT Take 1 tablet (4 mg total) by mouth every 8 (eight) hours as needed for nausea or vomiting.   pantoprazole 40 MG tablet Commonly known as: PROTONIX Take 40 mg by mouth daily.   pravastatin 20 MG tablet Commonly known as: PRAVACHOL Take 1 tablet (20 mg total) by mouth every evening.   rivaroxaban 20 MG Tabs tablet Commonly known as: Xarelto Take 1 tablet daily with evening meal What changed:   how much to take  how to take this  when to take this  additional instructions   tamsulosin 0.4 MG Caps capsule Commonly known as: FLOMAX Take 1 capsule by mouth once daily   traMADol 50 MG tablet Commonly known as: ULTRAM TAKE 1 TO 2 TABLETS BY MOUTH EVERY 8 HOURS AS NEEDED FOR MODERATE PAIN What changed: See the new instructions.   ursodiol 300 MG capsule Commonly known as: ACTIGALL Take 1 capsule (300 mg total) by mouth 2 (two) times daily.   vitamin B-12 100 MCG tablet Commonly known as: CYANOCOBALAMIN Take 100 mcg by mouth daily.       All past medical history, surgical history, allergies, family history, immunizations andmedications were updated in the EMR today and reviewed under the history and medication portions of their EMR.     ROS: Negative, with the exception of above mentioned in HPI   Objective:  BP 103/66 (BP Location: Left Arm, Patient Position: Sitting, Cuff Size: Normal)   Pulse 84   Temp 98.1 F (36.7 C) (Temporal)   Resp 18   Ht _0  (1.397 m)   Wt 104 lb 8 oz (47.4 kg)   LMP  (LMP Unknown) Comment: at age 52  SpO2 97%   BMI 24.29 kg/m  Body mass index is 24.29 kg/m. Gen: Afebrile. No acute distress. Nontoxic in appearance, well developed, well  nourished.  HENT: AT. [place].    Eyes:Pupils Equal Round Reactive to light, Extraocular movements intact,  Conjunctiva without redness, discharge or icterus. Neck/lymp/endocrine: Supple, no lymphadenopathy CV: RRR, no edema Chest: CTAB, no wheeze or crackles. Good air movement, normal resp effort.  Abd: Soft. Mild Left abd TTP.ND. BS present. no Masses palpated. No rebound or guarding.  Skin: No rashes, purpura or petechiae.  Neuro:  Ambulating well.  PERLA. EOMi. Alert. Oriented x3  Psych: Normal affect, dress and demeanor. Normal speech. Normal thought content and judgment.  No exam data present No results found. No results found for this or any previous visit (from the past 24 hour(s)).  Assessment/Plan: NADIAH CORBIT is a 59 y.o. female present for OV for surgical clearance and hospital follow up.  Recurrent UTI/nephrolithiasis Appropriately treated during hospital admission.  Symptoms have resolved. Discontinue Flomax it is no longer needed.  Kidney stone has passed. Will obtain urinalysis today to ensure complete resolution of UTI. - Urinalysis w microscopic + reflex cultur  Gallbladder sludge and cholelithiasis without cholecystitis/Pre-operative clearance -Patient should be low risk for surgical complication from a medical standpoint.  She is not a diabetic.  She is not anemic.  She is still smoking occasionally and she has been encouraged to quit smoking-she is asymptomatic and without known lung disease. - Cardiac clearance is recommended, if not already obtained due to her significant history of MI, stroke and heart disease.  Recommendations of perioperative anticoagulation deferred to cardiology team. -CBC-no signs of anemia or infection -CMP-within normal limits -Lipase greatly improved but still mildly elevated at 90   Patient is asking if it is possible her surgical date could be sooner than 6-8 weeks.  She is concerned for recurrence of pancreatitis, since she has had so many hospital admissions  surrounding recurrent pancreatitis.  She is asking if Dr. Harlow Asa cannot perform surgery sooner, if possibly one of his colleagues would have room on their schedule?    Advised patient we can certainly convey her concerns to her surgical team,  but I would also encourage her to call in and discuss this with them.  They may or may not be able to accommodate her sooner.  She reports she understands. -Continue Actigall per gastroenterology/surgical team.  No patient is free of risk when undergoing a procedure. The decision about whether to proceed with the operation belongs to the surgeon and the patient.   Pancreatic cyst/ Pancreatitis, recurrent/CA 19-9 elevated/History of recent hospitalization Continue to follow with gastroenterology surrounding her chronic pancreatitis. -CBC-no signs of anemia or infection -CMP-within normal limits -Lipase greatly improved but still mildly elevated at 90> patient did start bland solids yesterday.  Lytic bone lesion of femur/Closed nondisplaced fracture of greater trochanter of right femur, sequela Bone biopsy was negative for malignancy per path report.  She has follow-up with her interventional radiologist later this week for further recommendations.  She does not have oncology follow-up, uncertain if this is needed at this time since multiple myeloma panel and bone biopsy are both reassuring. As far as her "pathological "right greater trochanter fracture-it does not appear Ortho feels she is in need of surgical intervention for the fracture.  Patient is ambulating well today with mild gait change.  Will refer back to her PMR physician at this time per her preference.  Happy to place Ortho referral for her if felt needed in the future. - Ambulatory referral to Physical Medicine Rehab    Reviewed expectations re: course of current medical  issues.  Discussed self-management of symptoms.  Outlined signs and symptoms indicating need for more acute  intervention.  Patient verbalized understanding and all questions were answered.  Patient received an After-Visit Summary.    Orders Placed This Encounter  Procedures  . Urinalysis w microscopic + reflex cultur  . CBC w/Diff  . Comp Met (CMET)  . Lipase   No orders of the defined types were placed in this encounter.  Referral Orders  No referral(s) requested today    > 50 Minutes was dedicated to this patient's encounter to include pre-visit review of chart, face-to-face time with patient and post-visit work- which include documentation and prescribing medications and/or ordering test when necessary.    Note is dictated utilizing voice recognition software. Although note has been proof read prior to signing, occasional typographical errors still can be missed. If any questions arise, please do not hesitate to call for verification.   electronically signed by:  Howard Pouch, DO  [place]

## 2020-04-09 NOTE — Patient Instructions (Addendum)
We will call you with lab results and send to surgeon once we get everything back (likely next 1-2 days)  I have placed referrals to : Dr. Posey Pronto to follow up on your pain and your hip fracture and oncology to follow up.  Discontinue flomax. It is not needed any longer.  I hope you get relief and answers quickly.    Pancreatitis Eating Plan Pancreatitis is when your pancreas becomes irritated and swollen (inflamed). The pancreas is a small organ located behind your stomach. It helps your body digest food and regulate your blood sugar. Pancreatitis can affect how your body digests food, especially foods with fat. You may also have other symptoms such as abdominal pain or nausea. When you have pancreatitis, following a low-fat eating plan may help you manage symptoms and recover more quickly. Work with your health care provider or a diet and nutrition specialist (dietitian) to create an eating plan that is right for you. What are tips for following this plan? Reading food labels Use the information on food labels to help keep track of how much fat you eat:  Check the serving size.  Look for the amount of total fat in grams (g) in one serving. ? Low-fat foods have 3 g of fat or less per serving. ? Fat-free foods have 0.5 g of fat or less per serving.  Keep track of how much fat you eat based on how many servings you eat. ? For example, if you eat two servings, the amount of fat you eat will be two times what is listed on the label. Shopping   Buy low-fat or nonfat foods, such as: ? Fresh, frozen, or canned fruits and vegetables. ? Grains, including pasta, bread, and rice. ? Lean meat, poultry, fish, and other protein foods. ? Low-fat or nonfat dairy.  Avoid buying bakery products and other sweets made with whole milk, butter, and eggs.  Avoid buying snack foods with added fat, such as anything with butter or cheese flavoring. Cooking  Remove skin from poultry, and remove extra fat from  meat.  Limit the amount of fat and oil you use to 6 teaspoons or less per day.  Cook using low-fat methods, such as boiling, broiling, grilling, steaming, or baking.  Use spray oil to cook. Add fat-free chicken broth to add flavor and moisture.  Avoid adding cream to thicken soups or sauces. Use other thickeners such as corn starch or tomato paste. Meal planning   Eat a low-fat diet as told by your dietitian. For most people, this means having no more than 55-65 grams of fat each day.  Eat small, frequent meals throughout the day. For example, you may have 5-6 small meals instead of 3 large meals.  Drink enough fluid to keep your urine pale yellow.  Do not drink alcohol. Talk to your health care provider if you need help stopping.  Limit how much caffeine you have, including black coffee, black and green tea, caffeinated soft drinks, and energy drinks. General information  Let your health care provider or dietitian know if you have unplanned weight loss on this eating plan.  You may be instructed to follow a clear liquid diet during a flare of symptoms. Talk with your health care provider about how to manage your diet during symptoms of a flare.  Take any vitamins or supplements as told by your health care provider.  Work with a Microbiologist, especially if you have other conditions such as obesity or diabetes mellitus. What  foods should I avoid? Fruits Fried fruits. Fruits served with butter or cream. Vegetables Fried vegetables. Vegetables cooked with butter, cheese, or cream. Grains Biscuits, waffles, donuts, pastries, and croissants. Pies and cookies. Butter-flavored popcorn. Regular crackers. Meats and other protein foods Fatty cuts of meat. Poultry with skin. Organ meats. Bacon, sausage, and cold cuts. Whole eggs. Nuts and nut butters. Dairy Whole and 2% milk. Whole milk yogurt. Whole milk ice cream. Cream and half-and-half. Cream cheese. Sour cream.  Cheese. Beverages Wine, beer, and liquor. The items listed above may not be a complete list of foods and beverages to avoid. Contact a dietitian for more information. Summary  Pancreatitis can affect how your body digests food, especially foods with fat.  When you have pancreatitis, it is recommended that you follow a low-fat eating plan to help you recover more quickly and manage symptoms. For most people, this means limiting fat to no more than 55-65 grams per day.  Do not drink alcohol. Limit the amount of caffeine you have, and drink enough fluid to keep your urine pale yellow. This information is not intended to replace advice given to you by your health care provider. Make sure you discuss any questions you have with your health care provider. Document Revised: 01/03/2019 Document Reviewed: 12/19/2017 Elsevier Patient Education  [place].

## 2020-04-09 NOTE — Progress Notes (Addendum)
HISTORY AND PHYSICAL     CC:  follow up. Requesting Provider:  [name], DO  HPI: This is a 59 y.o. female here for follow up for carotid artery stenosis.  Pt was last seen on 11/27/2018 by Dr. Donnetta Hutching and at that time the pt had remained stable.  She has an extensive past history to include coronary artery bypass grafting in February 2019.  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 At that visit, she was continuing to make progress with rehabilitation and had no new neurologic deficit.  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  Pt denies any amaurosis fugax, speech difficulties, weakness, numbness, paralysis.  She states that she has had pancreatitis and they are planning on removing her gallbladder to see if this will help her.  She states she has been on a liquid diet for 4 weeks.  She states that her right arm has felt weaker since being on a liquid diet.    The pt is on a statin for cholesterol management.  The pt is not on a daily aspirin.   Other AC:  Xarelto for PAF. The pt is on BB for hypertension.   The pt is not diabetic.   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  Upon reviewing CT scan results from June, she has a AAA that measures 3.3cm.  She denies any severe back or abdominal pain.   Past Medical History:  Diagnosis Date  . AAA (abdominal aortic aneurysm) ([place])    a. 3.3cm infrarenal by CT 02/2020.  . Angio-edema   . Arthritis   . CAD (coronary artery disease)    a. s/p NSTEMI in 10/2017 with cath showing LM disease --> s/p CABG on 11/23/2017 with LIMA-LAD and SVG-OM.   [name] Carotid stenosis    a. Duplex 11/2018 9-37% RICA, 90-24% LICA.  [name] Gastritis and duodenitis  12/06/2019   11/2019 Peptic duodenitis  - No dysplasia or malignancy identified  B. STOMACH, BIOPSY:  - Mild chronic gastritis with intestinal metaplasia   . Hay fever   . Ischemic cardiomyopathy    a. EF 35-40% by echo in 10/2017. b. 55% by echo 01/2020.  . NSTEMI (non-ST elevated myocardial infarction) ([place])   . PAF (paroxysmal atrial fibrillation) ([place])    Diagnosed by ILR  . Pancreatitis   . Pilonidal cyst 10/21/2019  . Pneumonia 11/16/2017  . Right kidney stone 02/18/2020  . Stroke [facility]) 08/23/2017   Left MCA infarct status post TPA and thrombectomy  . Urticaria     Past Surgical History:  Procedure Laterality Date  . BIOPSY  12/02/2019   Procedure: BIOPSY;  Surgeon: Rush Landmark Telford Nab., MD;  Location: [place];  Service: Gastroenterology;;  . CORONARY ARTERY BYPASS GRAFT N/A 11/23/2017   Procedure: CORONARY ARTERY BYPASS GRAFTING (CABG) x 2, ON PUMP, USING LEFT INTERNAL MAMMARY ARTERY AND RIGHT GREATER SAPHENOUS VEIN HARVESTED ENDOSCOPICALLY;  Surgeon: Gaye Pollack, MD;  Location: [place];  Service: Open Heart Surgery;  Laterality: N/A;  . CRYOABLATION     cervical  . ESOPHAGOGASTRODUODENOSCOPY (EGD) WITH PROPOFOL N/A 12/02/2019   Procedure: ESOPHAGOGASTRODUODENOSCOPY (EGD) WITH PROPOFOL;  Surgeon: Irving Copas., MD;  Location: [place];  Service: Gastroenterology;  Laterality: N/A;  . ESOPHAGOGASTRODUODENOSCOPY (EGD) WITH PROPOFOL N/A 03/19/2020   Procedure: ESOPHAGOGASTRODUODENOSCOPY (EGD) WITH PROPOFOL;  Surgeon: Milus Banister, MD;  [facility];  Service: Endoscopy;  Laterality: N/A;  . EUS N/A 03/19/2020   Procedure: UPPER ENDOSCOPIC ULTRASOUND (EUS) RADIAL;  Surgeon: Milus Banister, MD;  Location: [facility];  Service: Endoscopy;  Laterality: N/A;  . EUS N/A 03/19/2020   Procedure: UPPER ENDOSCOPIC ULTRASOUND (EUS) LINEAR;  Surgeon: Milus Banister, MD;  [facility];  Service: Endoscopy;  Laterality: N/A;  . FINE NEEDLE ASPIRATION   12/02/2019   Procedure: FINE NEEDLE ASPIRATION (FNA) LINEAR;  Surgeon: Irving Copas., MD;  Location: [place];  Service: Gastroenterology;;  . FINE NEEDLE ASPIRATION N/A 03/19/2020   Procedure: FINE NEEDLE ASPIRATION (FNA) LINEAR;  Surgeon: Milus Banister, MD;  [facility];  Service: Endoscopy;  Laterality: N/A;  . IR ANGIO INTRA EXTRACRAN SEL COM CAROTID INNOMINATE UNI R MOD SED  08/21/2017  . IR ANGIO VERTEBRAL SEL SUBCLAVIAN INNOMINATE UNI R MOD SED  08/21/2017  . IR PERCUTANEOUS ART THROMBECTOMY/INFUSION INTRACRANIAL INC DIAG ANGIO  08/21/2017  . IR RADIOLOGIST EVAL & MGMT  10/30/2017  . LOOP RECORDER INSERTION N/A 08/28/2017   Procedure: LOOP RECORDER INSERTION;  Surgeon: Constance Haw, MD;  Location: [facility];  Service: Cardiovascular;  Laterality: N/A;  . RADIOLOGY WITH ANESTHESIA N/A 08/21/2017   Procedure: RADIOLOGY WITH ANESTHESIA;  Surgeon: Luanne Bras, MD;  Location: [place];  Service: Radiology;  Laterality: N/A;  . RIGHT/LEFT HEART CATH AND CORONARY ANGIOGRAPHY N/A 11/20/2017   Procedure: RIGHT/LEFT HEART CATH AND CORONARY ANGIOGRAPHY;  Surgeon: Jettie Booze, MD;  Location: [facility];  Service: Cardiovascular;  Laterality: N/A;  . TEE WITHOUT CARDIOVERSION N/A 08/28/2017   Procedure: TRANSESOPHAGEAL ECHOCARDIOGRAM (TEE);  Surgeon: Fay Records, MD;  Location: [place] [facility];  Service: Cardiovascular;  Laterality: N/A;  . TEE WITHOUT CARDIOVERSION N/A 11/23/2017   Procedure: TRANSESOPHAGEAL ECHOCARDIOGRAM (TEE);  Surgeon: Gaye Pollack, MD;  Location: [place];  Service: Open Heart Surgery;  Laterality: N/A;  . TUBAL LIGATION    . UPPER ESOPHAGEAL ENDOSCOPIC ULTRASOUND (EUS) N/A 12/02/2019   Procedure: UPPER ESOPHAGEAL ENDOSCOPIC ULTRASOUND (EUS);  Surgeon: Irving Copas., MD;  Location: [place];  Service: Gastroenterology;  Laterality: N/A;    Allergies  Allergen Reactions  . Dilaudid [Hydromorphone] Other (See  Comments)    Hallucinations  . Entresto [Sacubitril-Valsartan] Swelling and Rash    Welts, possible angioedema   . Lopressor [Metoprolol Tartrate] Swelling    Angioedema  . Allegra Allergy [Fexofenadine Hcl] Hives  . Nsaids     On Xarelto  . Gabapentin (Once-Daily) Rash  . Lipitor [Atorvastatin] Diarrhea    Current Outpatient Medications  Medication Sig Dispense Refill  . acetaminophen (TYLENOL) 325 MG tablet Take 2 tablets (650 mg total) by mouth every 6 (six) hours as needed for mild pain or moderate pain (or Fever >/= 101).    [name] azelastine (ASTELIN) 0.1 % nasal spray Place 1-2 sprays into both nostrils 2 (two) times daily as needed for rhinitis. Use in each nostril as directed 30 mL 5  . baclofen (LIORESAL) 10 MG tablet Take 10 mg by mouth in the morning and at bedtime.    . calcium carbonate (TUMS - DOSED IN MG ELEMENTAL CALCIUM) 500 MG chewable tablet Chew 1 tablet by mouth 2 (two) times daily.     [name]  carvedilol (COREG) 3.125 MG tablet Take 1 tablet (3.125 mg total) by mouth in the morning and at bedtime. 180 tablet 3  . cholecalciferol (VITAMIN D) 1000 units tablet Take 1,000 Units by mouth daily.    . ferrous sulfate 325 (65 FE) MG tablet Take 325 mg by mouth 2 (two) times a week. Mondays  and Thursdays.    . hydrOXYzine (ATARAX/VISTARIL) 10 MG tablet Take 1 tablet (10 mg total) by mouth at bedtime. 90 tablet 1  . levocetirizine (XYZAL) 5 MG tablet Take 1 tablet (5 mg total) by mouth every evening. 180 tablet 1  . ondansetron (ZOFRAN ODT) 4 MG disintegrating tablet Take 1 tablet (4 mg total) by mouth every 8 (eight) hours as needed for nausea or vomiting. 30 tablet 0  . pantoprazole (PROTONIX) 40 MG tablet Take 40 mg by mouth daily.    . pravastatin (PRAVACHOL) 20 MG tablet Take 1 tablet (20 mg total) by mouth every evening. 90 tablet 1  . rivaroxaban (XARELTO) 20 MG TABS tablet Take 1 tablet daily with evening meal (Patient taking differently: Take 20 mg by mouth daily with supper.  ) 90 tablet 3  . tamsulosin (FLOMAX) 0.4 MG CAPS capsule Take 1 capsule by mouth once daily 14 capsule 0  . traMADol (ULTRAM) 50 MG tablet TAKE 1 TO 2 TABLETS BY MOUTH EVERY 8 HOURS AS NEEDED FOR MODERATE PAIN (Patient taking differently: Take 50-100 mg by mouth every 8 (eight) hours as needed for moderate pain. ) 180 tablet 3  . ursodiol (ACTIGALL) 300 MG capsule Take 1 capsule (300 mg total) by mouth 2 (two) times daily. 60 capsule 0  . vitamin B-12 (CYANOCOBALAMIN) 100 MCG tablet Take 100 mcg by mouth daily.     No current facility-administered medications for this visit.    Family History  Problem Relation Age of Onset  . Heart attack Father   . Hypertension Mother   . Hyperlipidemia Mother   . Diabetes type II Mother   . Urticaria Mother   . Hypertension Brother   . Breast cancer Cousin 61  . Immunodeficiency Neg Hx   . Eczema Neg Hx   . Atopy Neg Hx   . Asthma Neg Hx   . Angioedema Neg Hx   . Allergic rhinitis Neg Hx   . Colon cancer Neg Hx   . Esophageal cancer Neg Hx   . Stomach cancer Neg Hx   . Pancreatic cancer Neg Hx   . Colon polyps Neg Hx   . Esophageal varices Neg Hx   . Inflammatory bowel disease Neg Hx   . Rectal cancer Neg Hx     Social History   Socioeconomic History  . Marital status: Married    Spouse name: Not on file  . Number of children: 2  . Years of education: Not on file  . Highest education level: Not on file  Occupational History  . Not on file  Tobacco Use  . Smoking status: Current Some Day Smoker    Packs/day: 0.10    Years: 37.00    Pack years: 3.70    Types: Cigarettes  . Smokeless tobacco: Never Used  Vaping Use  . Vaping Use: Never used  Substance and Sexual Activity  . Alcohol use: No  . Drug use: No  . Sexual activity: Yes    Partners: Male  Other Topics Concern  . Not on file  Social History Narrative   Married. 2 children.    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  Walks with cane or wheelchair.    Former smoker.     Drinks caffeine.    Smoke alarm in the home.   Firearms in the home.    Wears her seatbelt.    Feels safe In her relationships.    Social Determinants of Health   Financial Resource Strain:   . Difficulty of Paying Living Expenses:   Food Insecurity:   . Worried About Charity fundraiser in the Last Year:   . Arboriculturist in the Last Year:   Transportation Needs:   . Film/video editor (Medical):   [name] Lack of Transportation (Non-Medical):   Physical Activity:   . Days of Exercise per Week:   . Minutes of Exercise per Session:   Stress:   . Feeling of Stress :   Social Connections:   . Frequency of Communication with Friends and Family:   . Frequency of Social Gatherings with Friends and Family:   . Attends Religious Services:   . Active Member of Clubs or Organizations:   . Attends Archivist Meetings:   [name] Marital Status:   Intimate Partner Violence:   . Fear of Current or Ex-Partner:   . Emotionally Abused:   [name] Physically Abused:   . Sexually Abused:      REVIEW OF SYSTEMS:   [X]  denotes positive finding, [ ]  denotes negative finding Cardiac  Comments:  Chest pain or chest pressure:    Shortness of breath upon exertion:    Short of breath when lying flat:    Irregular heart rhythm:        Vascular    Pain in calf, thigh, or hip brought on by ambulation:    Pain in feet at night that wakes you up from your sleep:     Blood clot in your veins:    Leg swelling:         Pulmonary    Oxygen at home:    Productive cough:     Wheezing:         Neurologic    Sudden weakness in arms or legs:     Sudden numbness in arms or legs:     Sudden onset of difficulty speaking or slurred speech:    Temporary loss of vision in one eye:     Problems with dizziness:         Gastrointestinal    Blood in stool:     Vomited blood:         Genitourinary    Burning when urinating:     Blood in urine:        Psychiatric    Major depression:           Hematologic    Bleeding problems:    Problems with blood clotting too easily:        Skin    Rashes or ulcers:        Constitutional    Fever or chills:      PHYSICAL EXAMINATION:  Today's Vitals   04/09/20 1459  BP: 108/76  Pulse: 87  Resp: 20  Temp: 97.7 F (36.5 C)  TempSrc: Temporal  SpO2: 96%  Weight: 105 lb 14.4 oz (48 kg)  Height: 4\' 7"  (1.397 m)   Body mass index is 24.61 kg/m.   General:  WDWN in NAD; vital signs documented above Gait: Not observed HENT: WNL, normocephalic Pulmonary: normal non-labored breathing Cardiac: regular HR, without murmur without carotid bruits Abdomen:  soft, NT, no masses Skin: without rashes Vascular Exam/Pulses:  Right Left  Radial 2+ (normal) 2+ (normal)  Popliteal 2+ (normal) Unable to palpate  DP 2+ (normal) 2+ (normal)  PT Unable to palpate  Unable to palpate    Extremities: without ischemic changes, without Gangrene , without cellulitis; without open wounds;  Musculoskeletal: no muscle wasting or atrophy  Neurologic: A&O X 3 Psychiatric:  The pt has Normal affect.   Non-Invasive Vascular Imaging:   Carotid Duplex on 04/09/2020: Right:  40-59% ICA stenosis Left:  40-59% ICA stenosis   Previous Carotid duplex on 11/27/2018: Right: 1-39% ICA stenosis Left:   40-59% ICA stenosis    ASSESSMENT/PLAN:: 59 y.o. female here for follow up carotid artery stenosis.  Carotid artery stenosis -pt having some clumsiness of the right arm and attributes this to her nutritional status.  Her left carotid artery stenosis has remained stable.  Her right carotid artery stenosis has bumped to 40-59%.  -discussed s/s of stroke with pt and she understands should she develop any of these sx, she will go to the nearest ER.  -discussed with Dr. Oneida Alar and will have pt will f/u in 1 year with carotid duplex  AAA -pt had CT scan in June revealing 3.3cm AAA.  Will have her return in 2 years with AAA duplex.  I did discuss with her that  she is at very low risk for rupture and if she had sudden onset of severe abdominal or back pain to call 911 and go to the ER  Current Smoker -pt continues to smoke.  Discussed with pt the importance of smoking cessation given her carotid artery disease and AAA.  -pt will call sooner should she have any issues.   Leontine Locket, [facility] [phone]  Clinic MD:  Oneida Alar

## 2020-04-10 ENCOUNTER — Telehealth: Payer: Self-pay | Admitting: Family Medicine

## 2020-04-10 NOTE — Telephone Encounter (Signed)
Will forward to [facility] for upcoming appt 04/21/20.

## 2020-04-10 NOTE — Telephone Encounter (Signed)
Completed and returned to nursing workstation

## 2020-04-10 NOTE — Telephone Encounter (Signed)
Pt has appt with Bernerd Pho, [facility] 04/21/20. Awaiting on MD for further input in regards to Lovenox?

## 2020-04-10 NOTE — Telephone Encounter (Signed)
   Primary Cardiologist: [name], MD  Chart reviewed as part of pre-operative protocol coverage.  Callback, please let surgeon's office know we are awaiting Dr. Myles Gip response on Monday - see notes below from [place]. Thanks.    Charlie Pitter, PA-C 04/10/2020, 8:49 AM

## 2020-04-10 NOTE — Telephone Encounter (Signed)
Please inform patient: -Her lipase level is down to 90 (normal is below 60). -Blood cell counts, liver, kidney function and electrolytes are all normal. - I have completed her surgical clearance form and we will be faxing that over today. -I did note her desire for sooner surgical date if possible.  I would still encourage her to call and discuss with her surgical team.   -Please fax office visit note and lab results from 04/09/2020 to [facility] [phone] attention Emilio Math, LPN.

## 2020-04-11 LAB — URINALYSIS W MICROSCOPIC + REFLEX CULTURE
Bacteria, UA: NONE SEEN /HPF
Bilirubin Urine: NEGATIVE
Glucose, UA: NEGATIVE
Hgb urine dipstick: NEGATIVE
Hyaline Cast: NONE SEEN /LPF
Ketones, ur: NEGATIVE
Nitrites, Initial: NEGATIVE
Protein, ur: NEGATIVE
Specific Gravity, Urine: 1.013 (ref 1.001–1.03)
Squamous Epithelial / HPF: NONE SEEN /HPF (ref <=5)
WBC, UA: NONE SEEN /HPF (ref 0–5)
pH: 6 (ref 5.0–8.0)

## 2020-04-11 LAB — URINE CULTURE
MICRO NUMBER:: 10712359
Result:: NO GROWTH
SPECIMEN QUALITY:: ADEQUATE

## 2020-04-11 LAB — CULTURE INDICATED

## 2020-04-12 NOTE — Telephone Encounter (Signed)
Notes reviewed.  There seems to be a lot of "back-and-forth" on this issue.  If the surgeon insists that Xarelto needs to be held 3 days prior to the operation, then we should use a Lovenox bridge and this can be coordinated by our anticoagulation clinic.  If the surgeon feels it is acceptable for her to hold Xarelto for 24 hours prior to the operation (which would be our typical recommendation), then no Lovenox bridge is necessary.  I have not seen the surgical request from Dr. Harlow Asa in person, if it specifically indicates holding for 3 days prior, I think that answers our question.

## 2020-04-13 NOTE — Telephone Encounter (Signed)
Called pt and advised her to let us know when her procedure date is scheduled. We will schedule a visit with the PharmD to discuss Xarelto/Lovenox bridge. She verbalized understanding.

## 2020-04-13 NOTE — Telephone Encounter (Signed)
Called patient gave information. Labs have been faxed to number given. Patient will call if any questions.

## 2020-04-15 ENCOUNTER — Ambulatory Visit: Payer: Self-pay | Admitting: Surgery

## 2020-04-15 NOTE — H&P (View-Only) (Signed)
General Surgery [facility]  [name] DOB: 02-23-1961 Married / Language: English / Race: White Female   History of Present Illness   The patient is a 59 year old female who presents with acute pancreatitis.  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 General surgery was consulted for cholecystectomy. However, the patient continued to have mild abdominal pain and a mildly elevated lipase level. Patient was discharged home from [facility] on March 24, 2020. She has been maintained on a clear liquid diet. She presents today accompanied by her husband to discuss the timing of cholecystectomy. Patient has multiple issues. She is on chronic anticoagulation for atrial fibrillation with a history of stroke. She has had multiple episodes of acute pancreatitis and has had interval development of a small probable pseudocyst. Patient was also noted to have a lytic lesion in the right femur with possible pathologic fracture. She underwent biopsy which showed no evidence of malignancy. Patient is scheduled for follow-up tomorrow with her primary care physician. Patient denies any recurrent abdominal pain. She and her husband are interested in proceeding as soon as possible with cholecystectomy in hopes of avoiding another episode of acute pancreatitis.   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 Baclofen (10MG  Tablet, Oral) Active. Carvedilol (3.125MG  Tablet, Oral) Active. Ferrous Sulfate (325 (65 Fe)MG Tablet, Oral) Active. hydrOXYzine HCl (10MG  Tablet, Oral) Active. Levocetirizine Dihydrochloride (5MG  Tablet, Oral) Active. Ondansetron (4MG  Tablet Disint, Oral) Active. Pantoprazole Sodium (40MG  Tablet DR, Oral) Active. Pravastatin Sodium (20MG  Tablet, Oral) Active. Rosuvastatin Calcium (40MG  Tablet, Oral) Active. Tamsulosin HCl (0.4MG  Capsule, Oral) Active. traMADol HCl (50MG  Tablet, Oral) Active. Ursodiol (300MG  Capsule, Oral) Active. Omeprazole (40MG  Capsule DR, Oral) Active. Medications Reconciled Xarelto (20MG  Tablet, Oral) Active.  Vitals  Weight: 105.8 lb Height: 61in Body Surface Area: 1.44 m Body Mass Index: 19.99 kg/m  Temp.: 98.82F (Tympanic)  Pulse: 99 (Regular)  BP: 112/72(Sitting, Left Arm, Standard)  Physical Exam  GENERAL APPEARANCE Development: normal Nutritional status: normal Gross deformities: none  SKIN Rash, lesions, ulcers: none Induration, erythema: none Nodules: none palpable  EYES Conjunctiva and lids: normal Pupils: equal and reactive Iris: normal bilaterally  EARS, NOSE, MOUTH, THROAT External ears: no lesion or deformity External nose: no lesion or deformity Hearing: grossly normal Due to Covid-19 pandemic, patient is wearing a mask.  NECK Symmetric: yes Trachea: midline Thyroid: no palpable nodules in the thyroid bed  CHEST Respiratory effort: normal Retraction or accessory muscle use: no Breath sounds: normal bilaterally Rales, rhonchi, wheeze: none  CARDIOVASCULAR Auscultation: regular rhythm, normal rate Murmurs: none Pulses: radial pulse 2+ palpable Lower extremity edema: none  ABDOMEN Distension: none Masses: none palpable Tenderness: none Hepatosplenomegaly: not present Hernia: not present  MUSCULOSKELETAL Station  and gait: normal Digits and nails: no clubbing or cyanosis Muscle strength: grossly normal all extremities Range of motion: grossly normal all extremities Deformity: none  LYMPHATIC Cervical: none palpable Supraclavicular: none palpable  PSYCHIATRIC Oriented to person, place, and time: yes Mood and affect: normal for situation Judgment and  insight: appropriate for situation    Assessment & Plan  RECURRENT ACUTE PANCREATITIS (K85.90) 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 Gastroenterology had recommended proceeding with cholecystectomy in hopes of avoiding recurrent episodes of acute pancreatitis. Imaging studies had failed to identify cholelithiasis or other abnormality of the gallbladder. Recent imaging with CT scan and MRI scan had shown development of a approximately 3 cm pancreatic pseudocyst. Lipase level remained mildly elevated at 174 at the end of her hospitalization. She has been maintained on a clear liquid diet since discharge.  Patient is scheduled to see her primary care physician tomorrow. I will ask that they repeat her lipase level to see if it has normalized. Also we will request clearance from her primary care physician in order to proceed with cholecystectomy in the near future.  We will also request cardiac clearance from her cardiologist prior to scheduling her for cholecystectomy. We will also need guidance regarding management of her perioperative anticoagulation.  We again discussed laparoscopic cholecystectomy. We discussed the hospital stay to be anticipated. We discussed the fact that we would not address the pancreatic pseudocyst at the time of gallbladder surgery. She will need additional imaging in  approximately 3 months to evaluate the pseudocyst. Hopefully it will resolve spontaneously if she has no further episodes of pancreatitis.  Once the above studies and clearances are obtained, we will proceed with scheduling the patient for cholecystectomy.  Armandina Gemma, MD [facility] Office: [phone]

## 2020-04-15 NOTE — H&P (Signed)
General Surgery [facility]  [name] DOB: [birth year] Married / Language: English / Race: White Female   History of Present Illness   The patient is a 59 year old female who presents with acute pancreatitis.  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 General surgery was consulted for cholecystectomy. However, the patient continued to have mild abdominal pain and a mildly elevated lipase level. Patient was discharged home from [facility] on March 24, 2020. She has been maintained on a clear liquid diet. She presents today accompanied by her husband to discuss the timing of cholecystectomy. Patient has multiple issues. She is on chronic anticoagulation for atrial fibrillation with a history of stroke. She has had multiple episodes of acute pancreatitis and has had interval development of a small probable pseudocyst. Patient was also noted to have a lytic lesion in the right femur with possible pathologic fracture. She underwent biopsy which showed no evidence of malignancy. Patient is scheduled for follow-up tomorrow with her primary care physician. Patient denies any recurrent abdominal pain. She and her husband are interested in proceeding as soon as possible with cholecystectomy in hopes of avoiding another episode of acute pancreatitis.   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 Baclofen (10MG  Tablet, Oral) Active. Carvedilol (3.125MG  Tablet, Oral) Active. Ferrous Sulfate (325 (65 Fe)MG Tablet, Oral) Active. hydrOXYzine HCl (10MG  Tablet, Oral) Active. Levocetirizine Dihydrochloride (5MG  Tablet, Oral) Active. Ondansetron (4MG  Tablet Disint, Oral) Active. Pantoprazole Sodium (40MG  Tablet DR, Oral) Active. Pravastatin Sodium (20MG  Tablet, Oral) Active. Rosuvastatin Calcium (40MG  Tablet, Oral) Active. Tamsulosin HCl (0.4MG  Capsule, Oral) Active. traMADol HCl (50MG  Tablet, Oral) Active. Ursodiol (300MG  Capsule, Oral) Active. Omeprazole (40MG  Capsule DR, Oral) Active. Medications Reconciled Xarelto (20MG  Tablet, Oral) Active.  Vitals  Weight: 105.8 lb Height: 61in Body Surface Area: 1.44 m Body Mass Index: 19.99 kg/m  Temp.: 98.76F (Tympanic)  Pulse: 99 (Regular)  BP: 112/72(Sitting, Left Arm, Standard)  Physical Exam  GENERAL APPEARANCE Development: normal Nutritional status: normal Gross deformities: none  SKIN Rash, lesions, ulcers: none Induration, erythema: none Nodules: none palpable  EYES Conjunctiva and lids: normal Pupils: equal and reactive Iris: normal bilaterally  EARS, NOSE, MOUTH, THROAT External ears: no lesion or deformity External nose: no lesion or deformity Hearing: grossly normal Due to Covid-19 pandemic, patient is wearing a mask.  NECK Symmetric: yes Trachea: midline Thyroid: no palpable nodules in the thyroid bed  CHEST Respiratory effort: normal Retraction or accessory muscle use: no Breath sounds: normal bilaterally Rales, rhonchi, wheeze: none  CARDIOVASCULAR Auscultation: regular rhythm, normal rate Murmurs: none Pulses: radial pulse 2+ palpable Lower extremity edema: none  ABDOMEN Distension: none Masses: none palpable Tenderness: none Hepatosplenomegaly: not present Hernia: not present  MUSCULOSKELETAL Station  and gait: normal Digits and nails: no clubbing or cyanosis Muscle strength: grossly normal all extremities Range of motion: grossly normal all extremities Deformity: none  LYMPHATIC Cervical: none palpable Supraclavicular: none palpable  PSYCHIATRIC Oriented to person, place, and time: yes Mood and affect: normal for situation Judgment and  insight: appropriate for situation    Assessment & Plan  RECURRENT ACUTE PANCREATITIS (K85.90) 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 Gastroenterology had recommended proceeding with cholecystectomy in hopes of avoiding recurrent episodes of acute pancreatitis. Imaging studies had failed to identify cholelithiasis or other abnormality of the gallbladder. Recent imaging with CT scan and MRI scan had shown development of a approximately 3 cm pancreatic pseudocyst. Lipase level remained mildly elevated at 174 at the end of her hospitalization. She has been maintained on a clear liquid diet since discharge.  Patient is scheduled to see her primary care physician tomorrow. I will ask that they repeat her lipase level to see if it has normalized. Also we will request clearance from her primary care physician in order to proceed with cholecystectomy in the near future.  We will also request cardiac clearance from her cardiologist prior to scheduling her for cholecystectomy. We will also need guidance regarding management of her perioperative anticoagulation.  We again discussed laparoscopic cholecystectomy. We discussed the hospital stay to be anticipated. We discussed the fact that we would not address the pancreatic pseudocyst at the time of gallbladder surgery. She will need additional imaging in  approximately 3 months to evaluate the pseudocyst. Hopefully it will resolve spontaneously if she has no further episodes of pancreatitis.  Once the above studies and clearances are obtained, we will proceed with scheduling the patient for cholecystectomy.  Armandina Gemma, MD [facility] Office: [phone]

## 2020-04-20 ENCOUNTER — Telehealth: Payer: Self-pay

## 2020-04-20 ENCOUNTER — Telehealth: Payer: Self-pay | Admitting: Gastroenterology

## 2020-04-20 NOTE — Telephone Encounter (Signed)
Patient called and is upset that she hasnt heard from [facility] regarding a surgery date.  I told her that I would not be able to help her since we are two different medical facilities; however, I told her to call again today and speak with the nurse about her concerns. If she does not get a response, then keeping calling until someone can give her an answer.  Patient verbalized understanding.

## 2020-04-20 NOTE — Telephone Encounter (Signed)
Called in prescription for Baclofen 10 mg #90 no refills. Call for an appt.

## 2020-04-21 ENCOUNTER — Ambulatory Visit (INDEPENDENT_AMBULATORY_CARE_PROVIDER_SITE_OTHER): Payer: 59 | Admitting: Student

## 2020-04-21 ENCOUNTER — Encounter: Payer: Self-pay | Admitting: Student

## 2020-04-21 ENCOUNTER — Other Ambulatory Visit: Payer: Self-pay

## 2020-04-21 VITALS — BP 98/56 | HR 80 | Ht 61.0 in | Wt 108.4 lb

## 2020-04-21 DIAGNOSIS — I2510 Atherosclerotic heart disease of native coronary artery without angina pectoris: Secondary | ICD-10-CM | POA: Diagnosis not present

## 2020-04-21 DIAGNOSIS — I6523 Occlusion and stenosis of bilateral carotid arteries: Secondary | ICD-10-CM | POA: Diagnosis not present

## 2020-04-21 DIAGNOSIS — I480 Paroxysmal atrial fibrillation: Secondary | ICD-10-CM | POA: Diagnosis not present

## 2020-04-21 DIAGNOSIS — I255 Ischemic cardiomyopathy: Secondary | ICD-10-CM

## 2020-04-21 DIAGNOSIS — Z01810 Encounter for preprocedural cardiovascular examination: Secondary | ICD-10-CM

## 2020-04-21 NOTE — Patient Instructions (Signed)
Medication Instructions:  Your physician recommends that you continue on your current medications as directed. Please refer to the Current Medication list given to you today.  Hold Xarelto 3 day prior to surgery.  *If you need a refill on your cardiac medications before your next appointment, please call your pharmacy*   Lab Work: NONE   If you have labs (blood work) drawn today and your tests are completely normal, you will receive your results only by: [name] MyChart Message (if you have MyChart) OR . A paper copy in the mail If you have any lab test that is abnormal or we need to change your treatment, we will call you to review the results.   Testing/Procedures: NONE   Follow-Up: At [facility], you and your health needs are our priority.  As part of our continuing mission to provide you with exceptional heart care, we have created designated Provider Care Teams.  These Care Teams include your primary Cardiologist (physician) and Advanced Practice Providers (APPs -  Physician Assistants and Nurse Practitioners) who all work together to provide you with the care you need, when you need it.  We recommend signing up for the patient portal called "MyChart".  Sign up information is provided on this After Visit Summary.  MyChart is used to connect with patients for Virtual Visits (Telemedicine).  Patients are able to view lab/test results, encounter notes, upcoming appointments, etc.  Non-urgent messages can be sent to your provider as well.   To learn more about what you can do with MyChart, go to [email or web address].    Your next appointment:   6 month(s)  The format for your next appointment:   In Person  Provider:   [name], MD   Other Instructions Thank you for choosing [facility]!

## 2020-04-21 NOTE — Progress Notes (Addendum)
Cardiology Office Note    Date:  04/21/2020   ID:  [name], DOB August 15, 1961, MRN [id number]  PCP:  [name], DO  Cardiologist: [name], MD    Chief Complaint  Patient presents with  . Follow-up    4 month visit    History of Present Illness:    [name] is a 59 y.o. female with past medical history ofCAD (s/p NSTEMI in 10/2017 with 3-vessel CAD by cath and CABG on 11/23/2017 with LIMA-LAD and SVG-OM),PAF (on Xarelto),ischemic cardiomyopathy (EF 35-40% by echo in 10/2017, EF improved to 50-55% by repeat echo in 07/2019, at 55% by echo in 01/2020), carotid artery stenosis,HLD, and prior CVA (ILR in place) who presents to the office today for 72-month follow-up.   She was last examined by Dr. Domenic Polite in 12/2019 reported her breathing had overall been stable. She had experienced a rash and possible mild angioedema with Entresto and this had been discontinued. She was continued on Coreg for her cardiomyopathy and a repeat echocardiogram was recommended to reassess her EF. This showed her EF remained preserved at 55% and RV function was normal. She did have mild mitral valve regurgitation but no significant valve abnormalities.  In the interim, she was admitted to [facility] in 02/2020 for recurrent pancreatitis. Cardiology was consulted during admission for preoperative cardiac clearance in consideration of an empiric cholecystectomy. She was cleared to proceed from a cardiac perspective at that time. The patient was eventually discharged home and outpatient cholecystectomy was recommended. This is scheduled on 04/30/2020 with Dr. Harlow Asa. Our office was contacted in regards to preoperative cardiac clearance and she was felt of acceptable risk to proceed. It was felt that she would be okay to hold Xarelto for 24 hours prior to her upcoming surgery but per the surgical request, it was recommended that she hold this for 3 days prior to surgery and given her prior CVA,  a Lovenox bridge was recommended.  In talking with the patient today, she reports doing well from a cardiac perspective and denies any recent chest pain or dyspnea on exertion. No recent orthopnea, PND, lower extremity edema or palpitations.  She had been following a liquid diet since hospital discharge in 02/2020 but has most recently started consuming normal foods. She has lost over 15 pounds within the past few months due to her dietary changes and recurrent pancreatitis. She denies any current abdominal pain at this time.  Past Medical History:  Diagnosis Date  . AAA (abdominal aortic aneurysm) ([place])    a. 3.3cm infrarenal by CT 02/2020.  . Angio-edema   . Arthritis   . CAD (coronary artery disease)    a. s/p NSTEMI in 10/2017 with cath showing LM disease --> s/p CABG on 11/23/2017 with LIMA-LAD and SVG-OM.   [name] Carotid stenosis    a. Duplex 11/2018 6-21% RICA, 30-86% LICA.  [name] Gastritis and duodenitis 12/06/2019   11/2019 Peptic duodenitis  - No dysplasia or malignancy identified  B. STOMACH, BIOPSY:  - Mild chronic gastritis with intestinal metaplasia   . Hay fever   . Ischemic cardiomyopathy    a. EF 35-40% by echo in 10/2017. b. 55% by echo 01/2020.  . NSTEMI (non-ST elevated myocardial infarction) ([place])   . PAF (paroxysmal atrial fibrillation) ([place])    Diagnosed by ILR  . Pancreatitis   . Pilonidal cyst 10/21/2019  . Pneumonia 11/16/2017  . Right kidney stone 02/18/2020  . Stroke ([place]) 08/23/2017   Left MCA  infarct status post TPA and thrombectomy  . Urticaria     Past Surgical History:  Procedure Laterality Date  . BIOPSY  12/02/2019   Procedure: BIOPSY;  Surgeon: Rush Landmark Telford Nab., MD;  Location: [place];  Service: Gastroenterology;;  . CORONARY ARTERY BYPASS GRAFT N/A 11/23/2017   Procedure: CORONARY ARTERY BYPASS GRAFTING (CABG) x 2, ON PUMP, USING LEFT INTERNAL MAMMARY ARTERY AND RIGHT GREATER SAPHENOUS VEIN HARVESTED ENDOSCOPICALLY;  Surgeon: Gaye Pollack, MD;   Location: [place];  Service: Open Heart Surgery;  Laterality: N/A;  . CRYOABLATION     cervical  . ESOPHAGOGASTRODUODENOSCOPY (EGD) WITH PROPOFOL N/A 12/02/2019   Procedure: ESOPHAGOGASTRODUODENOSCOPY (EGD) WITH PROPOFOL;  Surgeon: Rush Landmark Telford Nab., MD;  Location: [place];  Service: Gastroenterology;  Laterality: N/A;  . ESOPHAGOGASTRODUODENOSCOPY (EGD) WITH PROPOFOL N/A 03/19/2020   Procedure: ESOPHAGOGASTRODUODENOSCOPY (EGD) WITH PROPOFOL;  Surgeon: Milus Banister, MD;  Location: [facility];  Service: Endoscopy;  Laterality: N/A;  . EUS N/A 03/19/2020   Procedure: UPPER ENDOSCOPIC ULTRASOUND (EUS) RADIAL;  Surgeon: Milus Banister, MD;  Location: [facility];  Service: Endoscopy;  Laterality: N/A;  . EUS N/A 03/19/2020   Procedure: UPPER ENDOSCOPIC ULTRASOUND (EUS) LINEAR;  Surgeon: Milus Banister, MD;  Location: [facility];  Service: Endoscopy;  Laterality: N/A;  . FINE NEEDLE ASPIRATION  12/02/2019   Procedure: FINE NEEDLE ASPIRATION (FNA) LINEAR;  Surgeon: Irving Copas., MD;  Location: [place];  Service: Gastroenterology;;  . FINE NEEDLE ASPIRATION N/A 03/19/2020   Procedure: FINE NEEDLE ASPIRATION (FNA) LINEAR;  Surgeon: Milus Banister, MD;  Location: [facility];  Service: Endoscopy;  Laterality: N/A;  . IR ANGIO INTRA EXTRACRAN SEL COM CAROTID INNOMINATE UNI R MOD SED  08/21/2017  . IR ANGIO VERTEBRAL SEL SUBCLAVIAN INNOMINATE UNI R MOD SED  08/21/2017  . IR PERCUTANEOUS ART THROMBECTOMY/INFUSION INTRACRANIAL INC DIAG ANGIO  08/21/2017  . IR RADIOLOGIST EVAL & MGMT  10/30/2017  . LOOP RECORDER INSERTION N/A 08/28/2017   Procedure: LOOP RECORDER INSERTION;  Surgeon: Constance Haw, MD;  Location: [place] [facility];  Service: Cardiovascular;  Laterality: N/A;  . RADIOLOGY WITH ANESTHESIA N/A 08/21/2017   Procedure: RADIOLOGY WITH ANESTHESIA;  Surgeon: Luanne Bras, MD;  Location: [place];  Service: Radiology;  Laterality: N/A;  . RIGHT/LEFT HEART  CATH AND CORONARY ANGIOGRAPHY N/A 11/20/2017   Procedure: RIGHT/LEFT HEART CATH AND CORONARY ANGIOGRAPHY;  Surgeon: Jettie Booze, MD;  Location: [place] [facility];  Service: Cardiovascular;  Laterality: N/A;  . TEE WITHOUT CARDIOVERSION N/A 08/28/2017   Procedure: TRANSESOPHAGEAL ECHOCARDIOGRAM (TEE);  Surgeon: Fay Records, MD;  Location: [place];  Service: Cardiovascular;  Laterality: N/A;  . TEE WITHOUT CARDIOVERSION N/A 11/23/2017   Procedure: TRANSESOPHAGEAL ECHOCARDIOGRAM (TEE);  Surgeon: Gaye Pollack, MD;  Location: [place];  Service: Open Heart Surgery;  Laterality: N/A;  . TUBAL LIGATION    . UPPER ESOPHAGEAL ENDOSCOPIC ULTRASOUND (EUS) N/A 12/02/2019   Procedure: UPPER ESOPHAGEAL ENDOSCOPIC ULTRASOUND (EUS);  Surgeon: Irving Copas., MD;  Location: [place];  Service: Gastroenterology;  Laterality: N/A;    Current Medications: Outpatient Medications Prior to Visit  Medication Sig Dispense Refill  . acetaminophen (TYLENOL) 325 MG tablet Take 2 tablets (650 mg total) by mouth every 6 (six) hours as needed for mild pain or moderate pain (or Fever >/= 101).    [name] azelastine (ASTELIN) 0.1 % nasal spray Place 1-2 sprays into both nostrils 2 (two) times daily as needed for rhinitis. Use in each  nostril as directed 30 mL 5  . baclofen (LIORESAL) 10 MG tablet Take 10 mg by mouth in the morning and at bedtime.    . calcium carbonate (TUMS - DOSED IN MG ELEMENTAL CALCIUM) 500 MG chewable tablet Chew 1 tablet by mouth 2 (two) times daily.     . carvedilol (COREG) 3.125 MG tablet Take 1 tablet (3.125 mg total) by mouth in the morning and at bedtime. 180 tablet 3  . cholecalciferol (VITAMIN D) 1000 units tablet Take 1,000 Units by mouth daily.    . ferrous sulfate 325 (65 FE) MG tablet Take 325 mg by mouth 2 (two) times a week. Mondays  and Thursdays.    . hydrOXYzine (ATARAX/VISTARIL) 10 MG tablet Take 1 tablet (10 mg total) by mouth at bedtime. 90 tablet 1  . levocetirizine  (XYZAL) 5 MG tablet Take 1 tablet (5 mg total) by mouth every evening. 180 tablet 1  . ondansetron (ZOFRAN ODT) 4 MG disintegrating tablet Take 1 tablet (4 mg total) by mouth every 8 (eight) hours as needed for nausea or vomiting. 30 tablet 0  . pantoprazole (PROTONIX) 40 MG tablet Take 40 mg by mouth daily.    . pravastatin (PRAVACHOL) 20 MG tablet Take 1 tablet (20 mg total) by mouth every evening. 90 tablet 1  . rivaroxaban (XARELTO) 20 MG TABS tablet Take 1 tablet daily with evening meal (Patient taking differently: Take 20 mg by mouth daily with supper. ) 90 tablet 3  . traMADol (ULTRAM) 50 MG tablet TAKE 1 TO 2 TABLETS BY MOUTH EVERY 8 HOURS AS NEEDED FOR MODERATE PAIN (Patient taking differently: Take 50-100 mg by mouth every 8 (eight) hours as needed for moderate pain. ) 180 tablet 3  . ursodiol (ACTIGALL) 300 MG capsule Take 1 capsule (300 mg total) by mouth 2 (two) times daily. 60 capsule 0  . vitamin B-12 (CYANOCOBALAMIN) 100 MCG tablet Take 100 mcg by mouth daily.    . tamsulosin (FLOMAX) 0.4 MG CAPS capsule Take 1 capsule by mouth once daily 14 capsule 0   No facility-administered medications prior to visit.     Allergies:   Dilaudid [hydromorphone], Entresto [sacubitril-valsartan], Lopressor [metoprolol tartrate], Allegra allergy [fexofenadine hcl], Nsaids, Gabapentin (once-daily), and Lipitor [atorvastatin]   Social History   Socioeconomic History  . Marital status: Married    Spouse name: Not on file  . Number of children: 2  . Years of education: Not on file  . Highest education level: Not on file  Occupational History  . Not on file  Tobacco Use  . Smoking status: Current Some Day Smoker    Packs/day: 0.10    Years: 37.00    Pack years: 3.70    Types: Cigarettes  . Smokeless tobacco: Never Used  Vaping Use  . Vaping Use: Never used  Substance and Sexual Activity  . Alcohol use: No  . Drug use: No  . Sexual activity: Yes    Partners: Male  Other Topics Concern    . Not on file  Social History Narrative   Married. 2 children.    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    Walks with cane or wheelchair.    Former smoker.    Drinks caffeine.    Smoke alarm in the home.   Firearms in the home.    Wears her seatbelt.    Feels safe In her relationships.    Social Determinants of Health   Financial Resource Strain:   . Difficulty of Paying Living  Expenses:   Food Insecurity:   . Worried About Charity fundraiser in the Last Year:   . Arboriculturist in the Last Year:   Transportation Needs:   . Film/video editor (Medical):   [name] Lack of Transportation (Non-Medical):   Physical Activity:   . Days of Exercise per Week:   . Minutes of Exercise per Session:   Stress:   . Feeling of Stress :   Social Connections:   . Frequency of Communication with Friends and Family:   . Frequency of Social Gatherings with Friends and Family:   . Attends Religious Services:   . Active Member of Clubs or Organizations:   . Attends Archivist Meetings:   [name] Marital Status:      Family History:  The patient's family history includes Breast cancer (age of onset: 68) in her cousin; Diabetes type II in her mother; Heart attack in her father; Hyperlipidemia in her mother; Hypertension in her brother and mother; Urticaria in her mother.   Review of Systems:   Please see the history of present illness.     General:  No chills, fever, night sweats or weight changes.  Cardiovascular:  No chest pain, dyspnea on exertion, edema, orthopnea, palpitations, paroxysmal nocturnal dyspnea. Dermatological: No rash, lesions/masses Respiratory: No cough, dyspnea Urologic: No hematuria, dysuria Abdominal:   No nausea, vomiting, diarrhea, bright red blood per rectum, melena, or hematemesis. Positive for abdominal pain (improved).  Neurologic:  No visual changes, wkns, changes in mental status.  All other systems reviewed and are otherwise negative except as noted  above.   Physical Exam:    VS:  BP (!) 98/56   Pulse 80   Ht 5\' 1"  (1.549 m)   Wt 108 lb 6.4 oz (49.2 kg)   LMP  (LMP Unknown) Comment: at age 74  SpO2 93%   BMI 20.48 kg/m    General: Well developed, well nourished,female appearing in no acute distress. Head: Normocephalic, atraumatic, sclera non-icteric. Wearing a mask.  Neck: No carotid bruits. JVD not elevated.  Lungs: Respirations regular and unlabored, without wheezes or rales.  Heart: Regular rate and rhythm. No S3 or S4.  No murmur, no rubs, or gallops appreciated. Abdomen: Soft, non-tender, non-distended. No obvious abdominal masses. Msk:  Strength and tone appear normal for age. No obvious joint deformities or effusions. Extremities: No clubbing or cyanosis. No lower extremity edema.  Distal pedal pulses are 2+ bilaterally. Neuro: Alert and oriented X 3. Moves all extremities spontaneously. No focal deficits noted. Psych:  Responds to questions appropriately with a normal affect. Skin: No rashes or lesions noted  Wt Readings from Last 3 Encounters:  04/21/20 108 lb 6.4 oz (49.2 kg)  04/09/20 105 lb 14.4 oz (48 kg)  04/09/20 104 lb 8 oz (47.4 kg)     Studies/Labs Reviewed:   EKG:  EKG is not ordered today. EKG from 03/17/2020 is reviewed and shows NSR, HR 94 with PVC's. No acute ST abnormalities when compared to prior tracings.   Recent Labs: 01/14/2020: TSH 4.58 03/18/2020: Magnesium 2.0 04/09/2020: ALT 10; BUN 15; Creatinine, Ser 0.68; Hemoglobin 14.5; Platelets 396.0; Potassium 4.3; Sodium 137   Lipid Panel    Component Value Date/Time   CHOL 203 (H) 09/11/2019 0453   TRIG 85 09/11/2019 0453   HDL 28 (L) 09/11/2019 0453   CHOLHDL 7.3 09/11/2019 0453   VLDL 17 09/11/2019 0453   LDLCALC 158 (H) 09/11/2019 0453    Additional studies/  records that were reviewed today include:   Echocardiogram: 01/2020 IMPRESSIONS    1. Left ventricular ejection fraction, by estimation, is 55%. The left  ventricle has  normal function. The left ventricle demonstrates regional  wall motion abnormalities (see scoring diagram/findings for description).  There is mild left ventricular  hypertrophy. Left ventricular diastolic parameters are consistent with  Grade I diastolic dysfunction (impaired relaxation).  2. Right ventricular systolic function is normal. The right ventricular  size is normal. Tricuspid regurgitation signal is inadequate for assessing  PA pressure.  3. Left atrial size was upper normal.  4. The mitral valve is grossly normal. Mild mitral valve regurgitation.  5. The aortic valve is tricuspid. Aortic valve regurgitation is not  visualized. Mild aortic valve sclerosis is present, with no evidence of  aortic valve stenosis.  6. The inferior vena cava is normal in size with greater than 50%  respiratory variability, suggesting right atrial pressure of 3 mmHg.   Carotid Dopplers: 03/2020 Summary:  Right Carotid: Velocities in the right ICA are consistent with a 40-59%         stenosis.   Left Carotid: Velocities in the left ICA are consistent with a 40-59%  stenosis.   Assessment:    1. Coronary artery disease involving native coronary artery of native heart without angina pectoris   2. PAF (paroxysmal atrial fibrillation) ([place])   3. Ischemic cardiomyopathy   4. Bilateral carotid artery stenosis   5. Preoperative cardiovascular examination      Plan:   In order of problems listed above:  1. CAD - She is s/p NSTEMI in 10/2017 with 3-vessel CAD by cath and CABG on 11/23/2017 with LIMA-LAD and SVG-OM. - She denies any recent chest pain or dyspnea on exertion.  - Continue Coreg 3.125mg  BID and Pravastatin 20mg  daily. She is not on ASA given the need for anticoagulation.   2. Paroxysmal Atrial Fibrillation - She denies any recent palpitations and is in NSR by examination today. Continue Coreg 3.125mg  BID for rate-control. - She denies any evidence of active bleeding.  Hgb stable at 14.5 by recent labs in 03/2020. Remains on Xarelto for anticoagulation. Will arrange for Lovenox bridging as outlined below.   3. History of Ischemic Cardiomyopathy - EF was previously 35-40% by echo in 10/2017, EF improved to 50-55% by repeat echo in 07/2019, at 55% by echo in 01/2020. - She denies any recent orthopnea, PND or edema. Appears euvolemic by examination today.  - Continue Coreg 3.125mg  BID. She is no longer on Entresto given concerns for angioedema in the past. Further medications are not indicated at this time given her normalized EF. BP does not allow for further titration either (at 98/56 during today's visit).   4. Carotid Artery Stenosis - Followed by Vascular Surgery and dopplers earlier this month showed 40-59% stenosis bilaterally. Continue statin therapy.   5. Preoperative Cardiac Clearance for Cholecystectomy - She was cleared from a cardiac perspective to proceed with surgery during her recent admission and this has not changed as she denies any recent anginal symptoms and her EF has normalized by her most recent echocardiogram. Her RCRI Risk is at 6.6% risk of a major cardiac event given his history of an MI and of a prior CVA.  - Dr. Harlow Asa previously requested for the patient to hold Xarelto for 3 days prior to her surgery and she confirmed this today. Will arrange for Lovenox bridging as previously recommended. She has an appointment with the anticoagulation clinic this  Thursday to further discuss. I will forward today's note to her surgeon as well.    Medication Adjustments/Labs and Tests Ordered: Current medicines are reviewed at length with the patient today.  Concerns regarding medicines are outlined above.  Medication changes, Labs and Tests ordered today are listed in the Patient Instructions below. Patient Instructions  Medication Instructions:  Your physician recommends that you continue on your current medications as directed. Please refer to the  Current Medication list given to you today.  Hold Xarelto 3 day prior to surgery.  *If you need a refill on your cardiac medications before your next appointment, please call your pharmacy*   Lab Work: NONE   If you have labs (blood work) drawn today and your tests are completely normal, you will receive your results only by: [name] MyChart Message (if you have MyChart) OR . A paper copy in the mail If you have any lab test that is abnormal or we need to change your treatment, we will call you to review the results.   Testing/Procedures: NONE   Follow-Up: At [facility], you and your health needs are our priority.  As part of our continuing mission to provide you with exceptional heart care, we have created designated Provider Care Teams.  These Care Teams include your primary Cardiologist (physician) and Advanced Practice Providers (APPs -  Physician Assistants and Nurse Practitioners) who all work together to provide you with the care you need, when you need it.  We recommend signing up for the patient portal called "MyChart".  Sign up information is provided on this After Visit Summary.  MyChart is used to connect with patients for Virtual Visits (Telemedicine).  Patients are able to view lab/test results, encounter notes, upcoming appointments, etc.  Non-urgent messages can be sent to your provider as well.   To learn more about what you can do with MyChart, go to [email or web address].    Your next appointment:   6 month(s)  The format for your next appointment:   In Person  Provider:   [name], MD   Other Instructions Thank you for choosing [facility]!       Signed, Erma Heritage, PA-C  04/21/2020 7:24 PM    [place]. [address] [place], [place] [address] Phone: [phone] Fax: [phone]

## 2020-04-22 ENCOUNTER — Telehealth: Payer: Self-pay

## 2020-04-22 MED ORDER — URSODIOL 300 MG PO CAPS: 300.0000 mg | ORAL_CAPSULE | Freq: Two times a day (BID) | ORAL | 0 refills | Status: DC

## 2020-04-22 MED ORDER — PANTOPRAZOLE SODIUM 40 MG PO TBEC: 40.0000 mg | DELAYED_RELEASE_TABLET | Freq: Every day | ORAL | 0 refills | Status: DC

## 2020-04-22 NOTE — Telephone Encounter (Signed)
Please advise 

## 2020-04-22 NOTE — Telephone Encounter (Signed)
Patient calling for [name]. She wants to speak to her regarding pantoprazole.  Please call  [phone]

## 2020-04-22 NOTE — Telephone Encounter (Signed)
Patient requesting Rx refill pantoprazole (PROTONIX) 40 MG tablet, ursodiol (ACTIGALL) 300 MG capsule. These were Rx'd by hospitalist. Patient only has 1 day left.

## 2020-04-22 NOTE — Addendum Note (Signed)
Addended by: Howard Pouch A on: 04/22/2020 12:53 PM   Modules accepted: Orders

## 2020-04-22 NOTE — Telephone Encounter (Signed)
See other note

## 2020-04-22 NOTE — Telephone Encounter (Signed)
Pt was called and VM was left to return call  °

## 2020-04-22 NOTE — Telephone Encounter (Signed)
Pt returned call and was given information

## 2020-04-22 NOTE — Telephone Encounter (Signed)
Received refill request for Actigall and Protonix.  I refilled 1 month worth on her Actigall-however if gastroenterologist/surgical team would like her to stay on this medication they would be the ones to refill in the future. This is not a common medicine prescribed by family medicine.  Protonix refilled for 90 days.  Also should be managed by gastroenterology in the future since they are treating her for this condition, at least until her conditions are stable since they may be altering the doses of medications they are managing.

## 2020-04-23 ENCOUNTER — Encounter (HOSPITAL_COMMUNITY): Payer: Self-pay

## 2020-04-23 ENCOUNTER — Ambulatory Visit (INDEPENDENT_AMBULATORY_CARE_PROVIDER_SITE_OTHER): Payer: 59 | Admitting: *Deleted

## 2020-04-23 DIAGNOSIS — Z5181 Encounter for therapeutic drug level monitoring: Secondary | ICD-10-CM

## 2020-04-23 DIAGNOSIS — I63512 Cerebral infarction due to unspecified occlusion or stenosis of left middle cerebral artery: Secondary | ICD-10-CM | POA: Diagnosis not present

## 2020-04-23 MED ORDER — ENOXAPARIN SODIUM 80 MG/0.8ML ~~LOC~~ SOLN: 80.0000 mg | SUBCUTANEOUS | 0 refills | Status: DC

## 2020-04-23 NOTE — Progress Notes (Signed)
PCP - Howard Pouch , DO Cardiologist - [name] lov with clearance 04-21-20 epic w/ Bernerd Pho PA-C  PPM/ICD -  Device Orders -  Rep Notified -   Chest x-ray - 1 view 03-17-20 epic, CT 02-2020  EKG - 03-17-20 epic Stress Test -  ECHO - 01-28-20 epic Cardiac Cath -   Sleep Study -  CPAP -   Fasting Blood Sugar -  Checks Blood Sugar _____ times a day  Blood Thinner Instructions:Xarelto hold 3 days with Lovenox bridge Aspirin Instructions:  ERAS Protcol - PRE-SURGERY   COVID TEST- 8-2  ACTIVITY-  Anesthesia review: AAA 3.3cm  MI,Stroke,CAD.CABG 2-02019 , Afib, Loop recorder   Patient denies shortness of breath, fever, cough and chest pain at PAT appointment   All instructions explained to the patient, with a verbal understanding of the material. Patient agrees to go over the instructions while at home for a better understanding. Patient also instructed to self quarantine after being tested for COVID-19. The opportunity to ask questions was provided.

## 2020-04-23 NOTE — Progress Notes (Signed)
Pt is on Xarelto.  Is pending cholecystectomy by Dr Harlow Asa on 8/4.  Wants pt off Xarelto 3 days before procedure and bridged with Lovenox.    Wt 50kg  Labs 04/09/20:  SCr 0.68  Hgb 14.5  Hct 43.2  Plts 396.0  8/1  Take last dose of Xarelto 8/2 Administer Lovenox 80mg  sq once daily @ 7pm 8/3  Lovenox 80mg  sq at 7pm 8/4  No Lovenox 8/5  Surgery ----restart Xarelto pm if ok with surgeon.  Will stay in hospital overnight.

## 2020-04-24 ENCOUNTER — Telehealth: Payer: Self-pay | Admitting: Gastroenterology

## 2020-04-24 NOTE — Telephone Encounter (Signed)
Spoke with patient.  She was advised to continue medication. Patient verbalized understanding.

## 2020-04-24 NOTE — Telephone Encounter (Signed)
Spoke with patient regarding Ursodiol.  Her PCP refilled for her but stated that she needed to contact our office to see if she needs to continue this medication since it was prescribed by Dr. Neysa Bonito while in the hospital.  Please advise.

## 2020-04-24 NOTE — Telephone Encounter (Signed)
Yes, to continue taking as prescribed.

## 2020-04-24 NOTE — Telephone Encounter (Signed)
Pt is requesting a call back from a nurse in regards to medication refill on her ursodiol. Pt wants to know if she still needs to continue taking this medication

## 2020-04-26 ENCOUNTER — Encounter: Payer: Self-pay | Admitting: Surgery

## 2020-04-27 ENCOUNTER — Other Ambulatory Visit (HOSPITAL_COMMUNITY)
Admission: RE | Admit: 2020-04-27 | Discharge: 2020-04-27 | Disposition: A | Discharge status: Home / Self Care | Payer: 59 | Source: Ambulatory Visit | Attending: Surgery | Admitting: Surgery

## 2020-04-27 DIAGNOSIS — Z01812 Encounter for preprocedural laboratory examination: Secondary | ICD-10-CM | POA: Diagnosis present

## 2020-04-27 DIAGNOSIS — Z20822 Contact with and (suspected) exposure to covid-19: Secondary | ICD-10-CM | POA: Diagnosis not present

## 2020-04-27 LAB — SARS CORONAVIRUS 2 (TAT 6-24 HRS): SARS Coronavirus 2: NEGATIVE

## 2020-04-29 ENCOUNTER — Encounter (HOSPITAL_COMMUNITY)
Admission: RE | Admit: 2020-04-29 | Discharge: 2020-04-29 | Disposition: A | Discharge status: Home / Self Care | Payer: 59 | Source: Ambulatory Visit | Attending: Surgery | Admitting: Surgery

## 2020-04-29 ENCOUNTER — Encounter (HOSPITAL_COMMUNITY): Payer: Self-pay

## 2020-04-29 ENCOUNTER — Other Ambulatory Visit: Payer: Self-pay

## 2020-04-29 DIAGNOSIS — K858 Other acute pancreatitis without necrosis or infection: Secondary | ICD-10-CM | POA: Diagnosis not present

## 2020-04-29 DIAGNOSIS — I2510 Atherosclerotic heart disease of native coronary artery without angina pectoris: Secondary | ICD-10-CM | POA: Diagnosis not present

## 2020-04-29 DIAGNOSIS — I255 Ischemic cardiomyopathy: Secondary | ICD-10-CM | POA: Insufficient documentation

## 2020-04-29 DIAGNOSIS — Z79899 Other long term (current) drug therapy: Secondary | ICD-10-CM | POA: Insufficient documentation

## 2020-04-29 DIAGNOSIS — Z7901 Long term (current) use of anticoagulants: Secondary | ICD-10-CM | POA: Insufficient documentation

## 2020-04-29 DIAGNOSIS — F17210 Nicotine dependence, cigarettes, uncomplicated: Secondary | ICD-10-CM | POA: Diagnosis not present

## 2020-04-29 DIAGNOSIS — Z01812 Encounter for preprocedural laboratory examination: Secondary | ICD-10-CM | POA: Insufficient documentation

## 2020-04-29 DIAGNOSIS — Z8673 Personal history of transient ischemic attack (TIA), and cerebral infarction without residual deficits: Secondary | ICD-10-CM | POA: Insufficient documentation

## 2020-04-29 DIAGNOSIS — I480 Paroxysmal atrial fibrillation: Secondary | ICD-10-CM | POA: Diagnosis not present

## 2020-04-29 DIAGNOSIS — I252 Old myocardial infarction: Secondary | ICD-10-CM | POA: Diagnosis not present

## 2020-04-29 HISTORY — DX: Personal history of urinary calculi: Z87.442

## 2020-04-29 LAB — BASIC METABOLIC PANEL
Anion gap: 9 (ref 5–15)
BUN: 25 mg/dL — ABNORMAL HIGH (ref 6–20)
CO2: 26 mmol/L (ref 22–32)
Calcium: 9.5 mg/dL (ref 8.9–10.3)
Chloride: 102 mmol/L (ref 98–111)
Creatinine, Ser: 0.55 mg/dL (ref 0.44–1.00)
GFR calc Af Amer: >60 mL/min (ref >60)
GFR calc non Af Amer: >60 mL/min (ref >60)
Glucose, Bld: 88 mg/dL (ref 70–99)
Potassium: 4.1 mmol/L (ref 3.5–5.1)
Sodium: 137 mmol/L (ref 135–145)

## 2020-04-29 LAB — CBC
HCT: 45.7 % (ref 36.0–46.0)
Hemoglobin: 14.8 g/dL (ref 12.0–15.0)
MCH: 31.4 pg (ref 26.0–34.0)
MCHC: 32.4 g/dL (ref 30.0–36.0)
MCV: 97 fL (ref 80.0–100.0)
Platelets: 363 10*3/uL (ref 150–400)
RBC: 4.71 MIL/uL (ref 3.87–5.11)
RDW: 13.6 % (ref 11.5–15.5)
WBC: 8.5 10*3/uL (ref 4.0–10.5)
nRBC: 0 % (ref 0.0–0.2)

## 2020-04-29 NOTE — Progress Notes (Signed)
COVID VACCINATION X 2-MODERNA  PCP - Howard Pouch , DO  Cardiologist - [name] lov with clearance 04-21-20 epic w/ Bernerd Pho PA-C  PPM/ICD -  Device Orders -  Rep Notified -   Chest x-ray - 1 view 03-17-20 epic, CT 02-2020   EKG - 03-17-20 epic  Stress Test -  ECHO - 01-28-20 epic Cardiac Cath -   Sleep Study -  CPAP -   Fasting Blood Sugar -  Checks Blood Sugar _____ times a day  Blood Thinner Instructions:Xarelto hold 3 days with Lovenox bridge.Marland KitchenMarland KitchenMarland KitchenLast dose Xarelto was 04/26/20 and currently injecting Lovenox Biweekly Aspirin Instructions:  [facility] - PRE-SURGERY   COVID TEST- 8-2  ACTIVITY- ADL w/o SOB  Anesthesia review: AAA 3.3cm  MI,Stroke,CAD.CABG 2-02019 , Afib, Loop recorder   Patient denies shortness of breath, fever, cough and chest pain at PAT appointment   All instructions explained to the patient, with a verbal understanding of the material. Patient agrees to go over the instructions while at home for a better understanding. Patient also instructed to self quarantine after being tested for COVID-19. The opportunity to ask questions was provided.

## 2020-04-29 NOTE — Patient Instructions (Addendum)
DUE TO COVID-19 ONLY ONE VISITOR IS ALLOWED TO COME WITH YOU AND STAY IN THE WAITING ROOM ONLY DURING PRE OP AND PROCEDURE DAY OF SURGERY. THE 2 VISITORS MAY VISIT WITH YOU AFTER SURGERY IN YOUR PRIVATE ROOM DURING VISITING HOURS ONLY!  YOU HAD A COVID 19 TEST ON 04-27-20. PLEASE CONTINUE THE QUARANTINE INSTRUCTIONS AS OUTLINED IN YOUR HANDOUT.                [name]  04/29/2020   Your procedure is scheduled on: 04-30-20    Report to [facility] Main  Entrance    Report to Admitting at 7:30 AM     Call this number if you have problems the morning of surgery [phone]    Remember: Do not eat food or drink liquids :After Midnight.     Take these medicines the morning of surgery with A SIP OF WATER: Ursodiol, Protonix, Carvedilol, and Nasal Spray if needed  BRUSH YOUR TEETH MORNING OF SURGERY AND RINSE YOUR MOUTH OUT, NO CHEWING GUM CANDY OR MINTS.                               You may not have any metal on your body including hair pins and              piercings     Do not wear jewelry, make-up, lotions, powders or perfumes, deodorant              Do not wear nail polish on your fingernails.  Do not shave  48 hours prior to surgery.                 Do not bring valuables to the hospital. [place].  Contacts, dentures or bridgework may not be worn into surgery.  You may bring a small overnight bag    Special Instructions: N/A              Please read over the following fact sheets you were given: _____________________________________________________________________             [facility] - Preparing for Surgery Before surgery, you can play an important role.  Because skin is not sterile, your skin needs to be as free of germs as possible.  You can reduce the number of germs on your skin by washing with CHG (chlorahexidine gluconate) soap before surgery.  CHG is an antiseptic cleaner which kills germs and  bonds with the skin to continue killing germs even after washing. Please DO NOT use if you have an allergy to CHG or antibacterial soaps.  If your skin becomes reddened/irritated stop using the CHG and inform your nurse when you arrive at Short Stay. Do not shave (including legs and underarms) for at least 48 hours prior to the first CHG shower.  You may shave your face/neck. Please follow these instructions carefully:  1.  Shower with CHG Soap the night before surgery and the  morning of Surgery.  2.  If you choose to wash your hair, wash your hair first as usual with your  normal  shampoo.  3.  After you shampoo, rinse your hair and body thoroughly to remove the  shampoo.  4.  Use CHG as you would any other liquid soap.  You can apply chg directly  to the skin and wash                       Gently with a scrungie or clean washcloth.  5.  Apply the CHG Soap to your body ONLY FROM THE NECK DOWN.   Do not use on face/ open                           Wound or open sores. Avoid contact with eyes, ears mouth and genitals (private parts).                       Wash face,  Genitals (private parts) with your normal soap.             6.  Wash thoroughly, paying special attention to the area where your surgery  will be performed.  7.  Thoroughly rinse your body with warm water from the neck down.  8.  DO NOT shower/wash with your normal soap after using and rinsing off  the CHG Soap.                9.  Pat yourself dry with a clean towel.            10.  Wear clean pajamas.            11.  Place clean sheets on your bed the night of your first shower and do not  sleep with pets. Day of Surgery : Do not apply any lotions/deodorants the morning of surgery.  Please wear clean clothes to the hospital/surgery center.  FAILURE TO FOLLOW THESE INSTRUCTIONS MAY RESULT IN THE CANCELLATION OF YOUR SURGERY PATIENT SIGNATURE_________________________________  NURSE  SIGNATURE__________________________________  ________________________________________________________________________

## 2020-04-29 NOTE — Progress Notes (Signed)
Anesthesia Chart Review   Case: [id number] Date/Time: 04/30/20 0915   Procedure: LAPAROSCOPIC CHOLECYSTECTOMY WITH INTRAOPERATIVE CHOLANGIOGRAM (N/A )   Anesthesia type: General   Pre-op diagnosis: RECURRENT PANCREATITIS   Location[facility] ROOM 05 / [facility]   Surgeons: Armandina Gemma, MD      DISCUSSION:59 y.o. current some day smoker (3.7 pack years) with h/o PAF (on Xarelto), CAD (s/p NSTEMI 10/2017 with 3 vessel CAD by cath and CABG 11/23/2017), ischemic cardiomyopathy (EF 35-40% by echo in 10/2017, EF improved to 50-55% by repeat echo in 07/2019, at 55% by echo in 01/2020), , recurrent pancreatitis scheduled for above procedure 04/30/2020 with Dr. Armandina Gemma.  Instructions for holding Xarelto and Lovenox bridge outlined to pt.    Pt last seen by cardiology 04/21/2020, "- She was cleared from a cardiac perspective to proceed with surgery during her recent admission and this has not changed as she denies any recent anginal symptoms and her EF has normalized by her most recent echocardiogram. Her RCRI Risk is at 6.6% risk of a major cardiac event given his history of an MI and of a prior CVA.  - Dr. Harlow Asa previously requested for the patient to hold Xarelto for 3 days prior to her surgery and she confirmed this today. Will arrange for Lovenox bridging as previously recommended. She has an appointment with the anticoagulation clinic this Thursday to further discuss. I will forward today's note to her surgeon as well."  Anticipate pt can proceed with planned procedure barring acute status change.   VS: BP 128/71   Pulse 95   Temp 36.6 C (Oral)   Resp 12   Ht 5\' 1"  (1.549 m)   Wt 48.8 kg   LMP  (LMP Unknown) Comment: at age 6  SpO2 97%   BMI 20.31 kg/m   PROVIDERS: [name], [name], DO is PCP   [name], MD is Cardiologist  LABS: Labs reviewed: Acceptable for surgery. (all labs ordered are listed, but only abnormal results are displayed)  Labs Reviewed  BASIC METABOLIC PANEL -  Abnormal; Notable for the following components:      Result Value   BUN 25 (*)    All other components within normal limits  CBC     IMAGES: Carotid VAS [place] 04/09/2020 Summary:  Right Carotid: Velocities in the right ICA are consistent with a 40-59%         stenosis.   Left Carotid: Velocities in the left ICA are consistent with a 40-59%  stenosis.   EKG: 03/17/2020 Rate 94 bpm  Sinus rhythm  Paired ventricular premature complexes Probable left atrial enlargement  Nonspecific intraventricular conduction delay  CV: Echo 01/28/2020 IMPRESSIONS    1. Left ventricular ejection fraction, by estimation, is 55%. The left  ventricle has normal function. The left ventricle demonstrates regional  wall motion abnormalities (see scoring diagram/findings for description).  There is mild left ventricular  hypertrophy. Left ventricular diastolic parameters are consistent with  Grade I diastolic dysfunction (impaired relaxation).  2. Right ventricular systolic function is normal. The right ventricular  size is normal. Tricuspid regurgitation signal is inadequate for assessing  PA pressure.  3. Left atrial size was upper normal.  4. The mitral valve is grossly normal. Mild mitral valve regurgitation.  5. The aortic valve is tricuspid. Aortic valve regurgitation is not  visualized. Mild aortic valve sclerosis is present, with no evidence of  aortic valve stenosis.  6. The inferior vena cava is normal in size with greater than  50%  respiratory variability, suggesting right atrial pressure of 3 mmHg.  Cardiac Cath 11/20/2017  Prox Cx to Mid Cx lesion is 100% stenosed.  Mid LM to Dist LM lesion is 75% stenosed.  Ost Cx to Prox Cx lesion is 50% stenosed.  Ost LAD to Prox LAD lesion is 40% stenosed.  Mid LAD lesion is 40% stenosed.  There is severe left ventricular systolic dysfunction.  The left ventricular ejection fraction is 25-35% by visual estimate.  There is no  aortic valve stenosis.  LV end diastolic pressure is normal.  Prox RCA lesion is 90% stenosed.  Ao sat 93%; PA sat 62%; CO 3.5 L/min; CI 2.29; mean PCWP 2 mm Hg. Normal right heart pressures.   Severe left main disease.   Severely decreased LV function.  Normal LVEDP.    Past Medical History:  Diagnosis Date  . AAA (abdominal aortic aneurysm) ([place])    a. 3.3cm infrarenal by CT 02/2020.  . Angio-edema   . Arthritis   . CAD (coronary artery disease)    a. s/p NSTEMI in 10/2017 with cath showing LM disease --> s/p CABG on 11/23/2017 with LIMA-LAD and SVG-OM.   [name] Carotid stenosis    a. Duplex 11/2018 7-82% RICA, 42-35% LICA.  [name] Gastritis and duodenitis 12/06/2019   11/2019 Peptic duodenitis  - No dysplasia or malignancy identified  B. STOMACH, BIOPSY:  - Mild chronic gastritis with intestinal metaplasia   . Hay fever   . History of kidney stones   . Ischemic cardiomyopathy    a. EF 35-40% by echo in 10/2017. b. 55% by echo 01/2020.  . NSTEMI (non-ST elevated myocardial infarction) ([place])   . PAF (paroxysmal atrial fibrillation) ([place])    Diagnosed by ILR  . Pancreatitis   . Pilonidal cyst 10/21/2019  . Pneumonia 11/16/2017  . Stroke [facility]) 08/23/2017   Left MCA infarct status post TPA and thrombectomy  . Urticaria     Past Surgical History:  Procedure Laterality Date  . BIOPSY  12/02/2019   Procedure: BIOPSY;  Surgeon: Rush Landmark Telford Nab., MD;  Location: [place];  Service: Gastroenterology;;  . CORONARY ARTERY BYPASS GRAFT N/A 11/23/2017   Procedure: CORONARY ARTERY BYPASS GRAFTING (CABG) x 2, ON PUMP, USING LEFT INTERNAL MAMMARY ARTERY AND RIGHT GREATER SAPHENOUS VEIN HARVESTED ENDOSCOPICALLY;  Surgeon: Gaye Pollack, MD;  Location: [place];  Service: Open Heart Surgery;  Laterality: N/A;  . CRYOABLATION     cervical  . ESOPHAGOGASTRODUODENOSCOPY (EGD) WITH PROPOFOL N/A 12/02/2019   Procedure: ESOPHAGOGASTRODUODENOSCOPY (EGD) WITH PROPOFOL;  Surgeon: Rush Landmark Telford Nab.,  MD;  Location: [place];  Service: Gastroenterology;  Laterality: N/A;  . ESOPHAGOGASTRODUODENOSCOPY (EGD) WITH PROPOFOL N/A 03/19/2020   Procedure: ESOPHAGOGASTRODUODENOSCOPY (EGD) WITH PROPOFOL;  Surgeon: Milus Banister, MD;  [facility];  Service: Endoscopy;  Laterality: N/A;  . EUS N/A 03/19/2020   Procedure: UPPER ENDOSCOPIC ULTRASOUND (EUS) RADIAL;  Surgeon: Milus Banister, MD;  [facility];  Service: Endoscopy;  Laterality: N/A;  . EUS N/A 03/19/2020   Procedure: UPPER ENDOSCOPIC ULTRASOUND (EUS) LINEAR;  Surgeon: Milus Banister, MD;  [facility];  Service: Endoscopy;  Laterality: N/A;  . FINE NEEDLE ASPIRATION  12/02/2019   Procedure: FINE NEEDLE ASPIRATION (FNA) LINEAR;  Surgeon: Irving Copas., MD;  Location: [place];  Service: Gastroenterology;;  . FINE NEEDLE ASPIRATION N/A 03/19/2020   Procedure: FINE NEEDLE ASPIRATION (FNA) LINEAR;  Surgeon: Milus Banister, MD;  [facility];  Service: Endoscopy;  Laterality:  N/A;  . IR ANGIO INTRA EXTRACRAN SEL COM CAROTID INNOMINATE UNI R MOD SED  08/21/2017  . IR ANGIO VERTEBRAL SEL SUBCLAVIAN INNOMINATE UNI R MOD SED  08/21/2017  . IR PERCUTANEOUS ART THROMBECTOMY/INFUSION INTRACRANIAL INC DIAG ANGIO  08/21/2017  . IR RADIOLOGIST EVAL & MGMT  10/30/2017  . LOOP RECORDER INSERTION N/A 08/28/2017   Procedure: LOOP RECORDER INSERTION;  Surgeon: Constance Haw, MD;  Location: [place] CV [facility];  Service: Cardiovascular;  Laterality: N/A;  . RADIOLOGY WITH ANESTHESIA N/A 08/21/2017   Procedure: RADIOLOGY WITH ANESTHESIA;  Surgeon: Luanne Bras, MD;  Location: [place];  Service: Radiology;  Laterality: N/A;  . RIGHT/LEFT HEART CATH AND CORONARY ANGIOGRAPHY N/A 11/20/2017   Procedure: RIGHT/LEFT HEART CATH AND CORONARY ANGIOGRAPHY;  Surgeon: Jettie Booze, MD;  Location: [place] [facility];  Service: Cardiovascular;  Laterality: N/A;  . TEE WITHOUT CARDIOVERSION N/A 08/28/2017    Procedure: TRANSESOPHAGEAL ECHOCARDIOGRAM (TEE);  Surgeon: Fay Records, MD;  Location: [place];  Service: Cardiovascular;  Laterality: N/A;  . TEE WITHOUT CARDIOVERSION N/A 11/23/2017   Procedure: TRANSESOPHAGEAL ECHOCARDIOGRAM (TEE);  Surgeon: Gaye Pollack, MD;  Location: [place];  Service: Open Heart Surgery;  Laterality: N/A;  . TUBAL LIGATION    . UPPER ESOPHAGEAL ENDOSCOPIC ULTRASOUND (EUS) N/A 12/02/2019   Procedure: UPPER ESOPHAGEAL ENDOSCOPIC ULTRASOUND (EUS);  Surgeon: Irving Copas., MD;  Location: [place];  Service: Gastroenterology;  Laterality: N/A;    MEDICATIONS: . acetaminophen (TYLENOL) 325 MG tablet  . azelastine (ASTELIN) 0.1 % nasal spray  . baclofen (LIORESAL) 10 MG tablet  . calcium carbonate (TUMS - DOSED IN MG ELEMENTAL CALCIUM) 500 MG chewable tablet  . carvedilol (COREG) 3.125 MG tablet  . cholecalciferol (VITAMIN D) 1000 units tablet  . enoxaparin (LOVENOX) 80 MG/0.8ML injection  . ferrous sulfate 325 (65 FE) MG tablet  . hydrOXYzine (ATARAX/VISTARIL) 10 MG tablet  . levocetirizine (XYZAL) 5 MG tablet  . ondansetron (ZOFRAN ODT) 4 MG disintegrating tablet  . pantoprazole (PROTONIX) 40 MG tablet  . pravastatin (PRAVACHOL) 20 MG tablet  . rivaroxaban (XARELTO) 20 MG TABS tablet  . traMADol (ULTRAM) 50 MG tablet  . ursodiol (ACTIGALL) 300 MG capsule  . vitamin B-12 (CYANOCOBALAMIN) 100 MCG tablet   No current facility-administered medications for this encounter.   Konrad Felix, PA-C [facility]Surgical Testing [phone]

## 2020-04-30 ENCOUNTER — Ambulatory Visit (HOSPITAL_COMMUNITY): Payer: 59 | Admitting: Certified Registered Nurse Anesthetist

## 2020-04-30 ENCOUNTER — Ambulatory Visit (HOSPITAL_COMMUNITY): Payer: 59 | Admitting: Physician Assistant

## 2020-04-30 ENCOUNTER — Ambulatory Visit (HOSPITAL_COMMUNITY)
Admission: RE | Admit: 2020-04-30 | Discharge: 2020-05-01 | Disposition: A | Discharge status: Home / Self Care | Payer: 59 | Source: Ambulatory Visit | Attending: Surgery | Admitting: Surgery

## 2020-04-30 ENCOUNTER — Encounter (HOSPITAL_COMMUNITY): Payer: Self-pay | Admitting: Surgery

## 2020-04-30 ENCOUNTER — Ambulatory Visit (HOSPITAL_COMMUNITY): Payer: 59

## 2020-04-30 ENCOUNTER — Encounter (HOSPITAL_COMMUNITY)
Admission: RE | Disposition: A | Discharge status: Home / Self Care | Payer: Self-pay | Source: Ambulatory Visit | Attending: Surgery

## 2020-04-30 DIAGNOSIS — Z79891 Long term (current) use of opiate analgesic: Secondary | ICD-10-CM | POA: Diagnosis not present

## 2020-04-30 DIAGNOSIS — K858 Other acute pancreatitis without necrosis or infection: Secondary | ICD-10-CM | POA: Insufficient documentation

## 2020-04-30 DIAGNOSIS — K859 Acute pancreatitis without necrosis or infection, unspecified: Secondary | ICD-10-CM

## 2020-04-30 DIAGNOSIS — K219 Gastro-esophageal reflux disease without esophagitis: Secondary | ICD-10-CM | POA: Insufficient documentation

## 2020-04-30 DIAGNOSIS — I4891 Unspecified atrial fibrillation: Secondary | ICD-10-CM | POA: Insufficient documentation

## 2020-04-30 DIAGNOSIS — Z951 Presence of aortocoronary bypass graft: Secondary | ICD-10-CM | POA: Insufficient documentation

## 2020-04-30 DIAGNOSIS — I2510 Atherosclerotic heart disease of native coronary artery without angina pectoris: Secondary | ICD-10-CM | POA: Diagnosis not present

## 2020-04-30 DIAGNOSIS — Z7901 Long term (current) use of anticoagulants: Secondary | ICD-10-CM | POA: Insufficient documentation

## 2020-04-30 DIAGNOSIS — I252 Old myocardial infarction: Secondary | ICD-10-CM | POA: Insufficient documentation

## 2020-04-30 DIAGNOSIS — I509 Heart failure, unspecified: Secondary | ICD-10-CM | POA: Diagnosis not present

## 2020-04-30 DIAGNOSIS — Z79899 Other long term (current) drug therapy: Secondary | ICD-10-CM | POA: Diagnosis not present

## 2020-04-30 DIAGNOSIS — K862 Cyst of pancreas: Secondary | ICD-10-CM | POA: Diagnosis not present

## 2020-04-30 DIAGNOSIS — Z8673 Personal history of transient ischemic attack (TIA), and cerebral infarction without residual deficits: Secondary | ICD-10-CM | POA: Diagnosis not present

## 2020-04-30 DIAGNOSIS — F17200 Nicotine dependence, unspecified, uncomplicated: Secondary | ICD-10-CM | POA: Diagnosis not present

## 2020-04-30 DIAGNOSIS — I110 Hypertensive heart disease with heart failure: Secondary | ICD-10-CM | POA: Insufficient documentation

## 2020-04-30 DIAGNOSIS — K8020 Calculus of gallbladder without cholecystitis without obstruction: Secondary | ICD-10-CM | POA: Insufficient documentation

## 2020-04-30 DIAGNOSIS — Z419 Encounter for procedure for purposes other than remedying health state, unspecified: Secondary | ICD-10-CM

## 2020-04-30 HISTORY — PX: CHOLECYSTECTOMY: SHX55

## 2020-04-30 SURGERY — LAPAROSCOPIC CHOLECYSTECTOMY WITH INTRAOPERATIVE CHOLANGIOGRAM
Anesthesia: General

## 2020-04-30 MED ORDER — MIDAZOLAM HCL 5 MG/5ML IJ SOLN
INTRAMUSCULAR | Status: DC | PRN
  Administered 2020-04-30: 2 mg via INTRAVENOUS

## 2020-04-30 MED ORDER — LACTATED RINGERS IR SOLN
Status: DC | PRN
  Administered 2020-04-30: 1000 mL

## 2020-04-30 MED ORDER — ONDANSETRON HCL 4 MG/2ML IJ SOLN: 4.0000 mg | Freq: Four times a day (QID) | INTRAMUSCULAR | Status: DC | PRN

## 2020-04-30 MED ORDER — ONDANSETRON HCL 4 MG/2ML IJ SOLN
INTRAMUSCULAR | Status: DC | PRN
  Administered 2020-04-30: 4 mg via INTRAVENOUS

## 2020-04-30 MED ORDER — CHLORHEXIDINE GLUCONATE CLOTH 2 % EX PADS: 6.0000 | MEDICATED_PAD | Freq: Once | CUTANEOUS | Status: DC

## 2020-04-30 MED ORDER — 0.9 % SODIUM CHLORIDE (POUR BTL) OPTIME
TOPICAL | Status: DC | PRN
  Administered 2020-04-30: 1000 mL

## 2020-04-30 MED ORDER — FENTANYL CITRATE (PF) 250 MCG/5ML IJ SOLN
INTRAMUSCULAR | Status: AC
  Filled 2020-04-30: qty 5

## 2020-04-30 MED ORDER — PROPOFOL 10 MG/ML IV BOLUS
INTRAVENOUS | Status: AC
  Filled 2020-04-30: qty 20

## 2020-04-30 MED ORDER — FENTANYL CITRATE (PF) 100 MCG/2ML IJ SOLN
25.0000 ug | INTRAMUSCULAR | Status: DC | PRN
  Administered 2020-04-30: 50 ug via INTRAVENOUS
  Administered 2020-04-30: 25 ug via INTRAVENOUS

## 2020-04-30 MED ORDER — LACTATED RINGERS IV SOLN: INTRAVENOUS | Status: DC

## 2020-04-30 MED ORDER — LIDOCAINE 2% (20 MG/ML) 5 ML SYRINGE
INTRAMUSCULAR | Status: DC | PRN
  Administered 2020-04-30: 40 mg via INTRAVENOUS

## 2020-04-30 MED ORDER — PROMETHAZINE HCL 25 MG/ML IJ SOLN: 6.2500 mg | INTRAMUSCULAR | Status: DC | PRN

## 2020-04-30 MED ORDER — SODIUM CHLORIDE 0.45 % IV SOLN: INTRAVENOUS | Status: DC

## 2020-04-30 MED ORDER — ROCURONIUM BROMIDE 10 MG/ML (PF) SYRINGE
PREFILLED_SYRINGE | INTRAVENOUS | Status: DC | PRN
  Administered 2020-04-30: 50 mg via INTRAVENOUS

## 2020-04-30 MED ORDER — OXYCODONE HCL 5 MG/5ML PO SOLN
ORAL | Status: AC
  Administered 2020-04-30: 5 mg via ORAL
  Filled 2020-04-30: qty 5

## 2020-04-30 MED ORDER — TRAMADOL HCL 50 MG PO TABS
50.0000 mg | ORAL_TABLET | Freq: Four times a day (QID) | ORAL | Status: DC | PRN
  Filled 2020-04-30: qty 1

## 2020-04-30 MED ORDER — BUPIVACAINE-EPINEPHRINE 0.5% -1:200000 IJ SOLN
INTRAMUSCULAR | Status: AC
  Filled 2020-04-30: qty 1

## 2020-04-30 MED ORDER — FENTANYL CITRATE (PF) 100 MCG/2ML IJ SOLN
INTRAMUSCULAR | Status: AC
  Administered 2020-04-30: 25 ug via INTRAVENOUS
  Filled 2020-04-30: qty 2

## 2020-04-30 MED ORDER — ACETAMINOPHEN 650 MG RE SUPP: 650.0000 mg | Freq: Four times a day (QID) | RECTAL | Status: DC | PRN

## 2020-04-30 MED ORDER — PANTOPRAZOLE SODIUM 40 MG PO TBEC
40.0000 mg | DELAYED_RELEASE_TABLET | Freq: Every day | ORAL | Status: DC
  Administered 2020-04-30: 40 mg via ORAL
  Filled 2020-04-30: qty 1

## 2020-04-30 MED ORDER — FENTANYL CITRATE (PF) 100 MCG/2ML IJ SOLN
INTRAMUSCULAR | Status: DC | PRN
  Administered 2020-04-30 (×4): 50 ug via INTRAVENOUS

## 2020-04-30 MED ORDER — MIDAZOLAM HCL 2 MG/2ML IJ SOLN
INTRAMUSCULAR | Status: AC
  Filled 2020-04-30: qty 2

## 2020-04-30 MED ORDER — IOHEXOL 300 MG/ML  SOLN
INTRAMUSCULAR | Status: DC | PRN
  Administered 2020-04-30: 4 mL

## 2020-04-30 MED ORDER — OXYCODONE HCL 5 MG PO TABS: 5.0000 mg | ORAL_TABLET | ORAL | 0 refills | Status: DC | PRN

## 2020-04-30 MED ORDER — ONDANSETRON HCL 4 MG/2ML IJ SOLN
INTRAMUSCULAR | Status: AC
  Filled 2020-04-30: qty 2

## 2020-04-30 MED ORDER — LIDOCAINE 2% (20 MG/ML) 5 ML SYRINGE
INTRAMUSCULAR | Status: AC
  Filled 2020-04-30: qty 5

## 2020-04-30 MED ORDER — PROPOFOL 10 MG/ML IV BOLUS
INTRAVENOUS | Status: DC | PRN
  Administered 2020-04-30: 100 mg via INTRAVENOUS
  Administered 2020-04-30: 50 mg via INTRAVENOUS

## 2020-04-30 MED ORDER — OXYCODONE HCL 5 MG PO TABS: 5.0000 mg | ORAL_TABLET | Freq: Once | ORAL | Status: AC | PRN

## 2020-04-30 MED ORDER — ACETAMINOPHEN 325 MG PO TABS
650.0000 mg | ORAL_TABLET | Freq: Four times a day (QID) | ORAL | Status: DC | PRN
  Administered 2020-05-01: 650 mg via ORAL
  Filled 2020-04-30: qty 2

## 2020-04-30 MED ORDER — OXYCODONE HCL 5 MG/5ML PO SOLN: 5.0000 mg | Freq: Once | ORAL | Status: AC | PRN

## 2020-04-30 MED ORDER — OXYCODONE HCL 5 MG PO TABS
5.0000 mg | ORAL_TABLET | ORAL | Status: DC | PRN
  Administered 2020-04-30 – 2020-05-01 (×4): 5 mg via ORAL
  Filled 2020-04-30 (×4): qty 1

## 2020-04-30 MED ORDER — BACLOFEN 10 MG PO TABS
10.0000 mg | ORAL_TABLET | Freq: Two times a day (BID) | ORAL | Status: DC
  Administered 2020-04-30: 10 mg via ORAL
  Filled 2020-04-30: qty 1

## 2020-04-30 MED ORDER — DEXAMETHASONE SODIUM PHOSPHATE 10 MG/ML IJ SOLN
INTRAMUSCULAR | Status: AC
  Filled 2020-04-30: qty 1

## 2020-04-30 MED ORDER — SUGAMMADEX SODIUM 200 MG/2ML IV SOLN
INTRAVENOUS | Status: DC | PRN
  Administered 2020-04-30: 200 mg via INTRAVENOUS

## 2020-04-30 MED ORDER — ONDANSETRON 4 MG PO TBDP: 4.0000 mg | ORAL_TABLET | Freq: Four times a day (QID) | ORAL | Status: DC | PRN

## 2020-04-30 MED ORDER — PHENYLEPHRINE 40 MCG/ML (10ML) SYRINGE FOR IV PUSH (FOR BLOOD PRESSURE SUPPORT)
PREFILLED_SYRINGE | INTRAVENOUS | Status: DC | PRN
  Administered 2020-04-30: 80 ug via INTRAVENOUS

## 2020-04-30 MED ORDER — CARVEDILOL 3.125 MG PO TABS: 3.1250 mg | ORAL_TABLET | Freq: Two times a day (BID) | ORAL | Status: DC

## 2020-04-30 MED ORDER — BUPIVACAINE-EPINEPHRINE 0.5% -1:200000 IJ SOLN
INTRAMUSCULAR | Status: DC | PRN
  Administered 2020-04-30: 30 mL

## 2020-04-30 MED ORDER — CHLORHEXIDINE GLUCONATE 0.12 % MT SOLN
15.0000 mL | Freq: Once | OROMUCOSAL | Status: AC
  Administered 2020-04-30: 15 mL via OROMUCOSAL

## 2020-04-30 MED ORDER — ORAL CARE MOUTH RINSE: 15.0000 mL | Freq: Once | OROMUCOSAL | Status: AC

## 2020-04-30 MED ORDER — CEFAZOLIN SODIUM-DEXTROSE 2-4 GM/100ML-% IV SOLN
2.0000 g | INTRAVENOUS | Status: AC
  Administered 2020-04-30: 2 g via INTRAVENOUS
  Filled 2020-04-30: qty 100

## 2020-04-30 MED ORDER — ROCURONIUM BROMIDE 10 MG/ML (PF) SYRINGE
PREFILLED_SYRINGE | INTRAVENOUS | Status: AC
  Filled 2020-04-30: qty 10

## 2020-04-30 SURGICAL SUPPLY — 35 items
APPLIER CLIP ROT 10 11.4 M/L (STAPLE) ×2
BAG SPEC RTRVL LRG 6X4 10 (ENDOMECHANICALS) ×1
CABLE HIGH FREQUENCY MONO STRZ (ELECTRODE) ×2 IMPLANT
CHLORAPREP W/TINT 26 (MISCELLANEOUS) ×4 IMPLANT
CLIP APPLIE ROT 10 11.4 M/L (STAPLE) ×1 IMPLANT
COVER MAYO STAND STRL (DRAPES) ×2 IMPLANT
COVER SURGICAL LIGHT HANDLE (MISCELLANEOUS) ×2 IMPLANT
COVER WAND RF STERILE (DRAPES) IMPLANT
DECANTER SPIKE VIAL GLASS SM (MISCELLANEOUS) ×2 IMPLANT
DERMABOND ADVANCED (GAUZE/BANDAGES/DRESSINGS) ×1
DERMABOND ADVANCED .7 DNX12 (GAUZE/BANDAGES/DRESSINGS) ×1 IMPLANT
DRAPE C-ARM 42X120 X-RAY (DRAPES) ×2 IMPLANT
ELECT REM PT RETURN 15FT ADLT (MISCELLANEOUS) ×2 IMPLANT
GAUZE SPONGE 2X2 8PLY STRL LF (GAUZE/BANDAGES/DRESSINGS) ×1 IMPLANT
GLOVE SURG ORTHO 8.0 STRL STRW (GLOVE) ×2 IMPLANT
GOWN STRL REUS W/TWL XL LVL3 (GOWN DISPOSABLE) ×4 IMPLANT
HEMOSTAT SURGICEL 4X8 (HEMOSTASIS) IMPLANT
KIT BASIN OR (CUSTOM PROCEDURE TRAY) ×2 IMPLANT
KIT TURNOVER KIT A (KITS) IMPLANT
PENCIL SMOKE EVACUATOR (MISCELLANEOUS) IMPLANT
POUCH SPECIMEN RETRIEVAL 10MM (ENDOMECHANICALS) ×2 IMPLANT
SCISSORS LAP 5X35 DISP (ENDOMECHANICALS) ×2 IMPLANT
SET CHOLANGIOGRAPH MIX (MISCELLANEOUS) ×2 IMPLANT
SET IRRIG TUBING LAPAROSCOPIC (IRRIGATION / IRRIGATOR) ×2 IMPLANT
SET TUBE SMOKE EVAC HIGH FLOW (TUBING) IMPLANT
SLEEVE XCEL OPT CAN 5 100 (ENDOMECHANICALS) ×2 IMPLANT
SPONGE GAUZE 2X2 STER 10/PKG (GAUZE/BANDAGES/DRESSINGS) ×1
STRIP CLOSURE SKIN 1/2X4 (GAUZE/BANDAGES/DRESSINGS) IMPLANT
SUT MNCRL AB 4-0 PS2 18 (SUTURE) ×2 IMPLANT
TOWEL OR 17X26 10 PK STRL BLUE (TOWEL DISPOSABLE) ×2 IMPLANT
TOWEL OR NON WOVEN STRL DISP B (DISPOSABLE) ×2 IMPLANT
TRAY LAPAROSCOPIC (CUSTOM PROCEDURE TRAY) ×2 IMPLANT
TROCAR BLADELESS OPT 5 100 (ENDOMECHANICALS) ×2 IMPLANT
TROCAR XCEL BLUNT TIP 100MML (ENDOMECHANICALS) ×2 IMPLANT
TROCAR XCEL NON-BLD 11X100MML (ENDOMECHANICALS) ×2 IMPLANT

## 2020-04-30 NOTE — Anesthesia Postprocedure Evaluation (Signed)
Anesthesia Post Note  Patient: [name]  Procedure(s) Performed: LAPAROSCOPIC CHOLECYSTECTOMY WITH INTRAOPERATIVE CHOLANGIOGRAM (N/A )     Patient location during evaluation: PACU Anesthesia Type: General Level of consciousness: awake and alert Pain management: pain level controlled Vital Signs Assessment: post-procedure vital signs reviewed and stable Respiratory status: spontaneous breathing, nonlabored ventilation, respiratory function stable and patient connected to nasal cannula oxygen Cardiovascular status: blood pressure returned to baseline and stable Postop Assessment: no apparent nausea or vomiting Anesthetic complications: no   No complications documented.  Last Vitals:  Vitals:   04/30/20 1155 04/30/20 1300  BP: 130/89 114/74  Pulse: 93 87  Resp: 14   Temp: (!) 36.4 C (!) 36.4 C  SpO2:  96%    Last Pain:  Vitals:   04/30/20 1300  TempSrc: Oral  PainSc:                  Audry Pili

## 2020-04-30 NOTE — Op Note (Signed)
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 General surgery was consulted for cholecystectomy. However, the patient continued to have mild abdominal pain and a mildly elevated lipase level. Patient was discharged home from [facility] on March 24, 2020. She has been maintained on a clear liquid diet. She presents today accompanied by her husband to discuss the timing of cholecystectomy. Patient has multiple issues. She is on chronic anticoagulation for atrial fibrillation with a history of stroke. She has had multiple episodes of acute pancreatitis and has had interval development of a small probable pseudocyst. Patient was also noted to have a lytic lesion in the right femur with possible pathologic fracture. She underwent biopsy which showed no evidence of malignancy. Patient is scheduled for follow-up tomorrow with her primary care physician. Patient denies any recurrent abdominal pain. She and her husband are interested in proceeding as soon as possible with cholecystectomy in hopes of avoiding another episode of acute pancreatitis.  Procedure Details:  The patient was seen in the pre-op holding area. The risks, benefits, complications, treatment options, and expected outcomes were previously discussed with the patient. The patient agreed with the proposed plan and has signed the informed consent form.  The patient was transported to operating room # 5 at the [facility]. The patient was placed in the supine position on the operating room table. Following induction of general anesthesia, the abdomen was prepped and draped in the usual aseptic fashion.  An incision was made in the skin near the umbilicus. The midline fascia was incised and the peritoneal cavity was entered and a Hasson cannula was introduced under direct vision. The cannula was secured with a 0-Vicryl pursestring suture. Pneumoperitoneum was established with carbon dioxide. Additional cannulae were introduced under direct vision along the right costal margin in the midline, mid-clavicular line, and anterior axillary line.   The gallbladder was identified and the fundus grasped and retracted cephalad. Adhesions were taken down bluntly and the electrocautery was utilized as needed, taking care not to involve any adjacent structures. The infundibulum was grasped and retracted laterally, exposing the peritoneum overlying the triangle of Calot. The peritoneum was incised and structures exposed with blunt dissection. The cystic duct was clearly identified, bluntly dissected circumferentially, and clipped at the neck of the gallbladder.  An incision was made in the cystic duct and the cholangiogram catheter introduced. The catheter was secured using an ligaclip.  Real-time cholangiography was performed using C-arm fluoroscopy.  There was rapid filling of a normal caliber common bile duct.  There was reflux of contrast into the left and right hepatic ductal systems.  There was free flow distally into the duodenum without filling defect or obstruction.  The catheter was removed from the peritoneal cavity.  The cystic duct was then ligated with ligaclips and divided. The cystic artery was identified, dissected circumferentially, ligated with ligaclips, and divided.  The gallbladder was dissected away from the gallbladder bed using the electrocautery for hemostasis. The gallbladder was completely removed from  the liver and  placed into an endocatch bag. The gallbladder was removed in the endocatch bag through the umbilical port site and submitted to pathology for review.  The right upper quadrant was irrigated and the gallbladder bed was inspected. Hemostasis was achieved with the electrocautery.  Cannulae were removed under direct vision and good hemostasis was noted. Pneumoperitoneum was released and the majority of the carbon dioxide evacuated. The umbilical wound was irrigated and the fascia was then closed with the pursestring suture.  Local anesthetic was infiltrated at all port sites. Skin incisions were closed with 4-0 Monocril subcuticular sutures and Dermabond was applied.  Instrument, sponge, and needle counts were correct at the conclusion of the case.  The patient was awakened from anesthesia and brought to the recovery room in stable condition.  The patient tolerated the procedure well.   Armandina Gemma, MD [facility] Office: [phone]

## 2020-04-30 NOTE — Anesthesia Preprocedure Evaluation (Addendum)
Anesthesia Evaluation  Patient identified by MRN, date of birth, ID band Patient awake    Reviewed: Allergy & Precautions, NPO status , Patient's Chart, lab work & pertinent test results, reviewed documented beta blocker date and time   History of Anesthesia Complications Negative for: history of anesthetic complications  Airway Mallampati: II  TM Distance: >3 FB Neck ROM: Full    Dental  (+) Dental Advisory Given   Pulmonary Current SmokerPatient did not abstain from smoking.,    Pulmonary exam normal        Cardiovascular hypertension, Pt. on medications and Pt. on home beta blockers + CAD, + Past MI, + CABG (2019) and +CHF  Normal cardiovascular exam+ dysrhythmias Atrial Fibrillation    '21 Carotid US - 40-59% ICAS b/l  '21 TTE - EF 55%. The left ventricle demonstrates regional wall motion abnormalities. There is mild left ventricular hypertrophy. Grade I diastolic dysfunction (impaired relaxation). Mild MR. Mild aortic valve sclerosis is present, with no evidence of aortic valve stenosis.    Neuro/Psych PSYCHIATRIC DISORDERS Anxiety CVA, Residual Symptoms    GI/Hepatic Neg liver ROS, GERD  Medicated and Controlled, Chronic pancreatitis    Endo/Other  negative endocrine ROS  Renal/GU negative Renal ROS     Musculoskeletal  (+) Arthritis ,   Abdominal   Peds  Hematology negative hematology ROS (+)   Anesthesia Other Findings Covid test negative   Reproductive/Obstetrics                            Anesthesia Physical  Anesthesia Plan  ASA: III  Anesthesia Plan: General   Post-op Pain Management:    Induction: Intravenous  PONV Risk Score and Plan: 3 and Treatment may vary due to age or medical condition, Ondansetron, Midazolam and Dexamethasone  Airway Management Planned: Oral ETT  Additional Equipment: None  Intra-op Plan:   Post-operative Plan: Extubation in  OR  Informed Consent: I have reviewed the patients History and Physical, chart, labs and discussed the procedure including the risks, benefits and alternatives for the proposed anesthesia with the patient or authorized representative who has indicated his/her understanding and acceptance.     Dental advisory given  Plan Discussed with: CRNA and Anesthesiologist  Anesthesia Plan Comments:        Anesthesia Quick Evaluation

## 2020-04-30 NOTE — Plan of Care (Signed)
Patient settled in from PACU and educated on care plan for today.

## 2020-04-30 NOTE — Interval H&P Note (Signed)
History and Physical Interval Note:  04/30/2020 8:48 AM  [name]  has presented today for surgery, with the diagnosis of RECURRENT PANCREATITIS.  The various methods of treatment have been discussed with the patient and family. After consideration of risks, benefits and other options for treatment, the patient has consented to    Procedure(s): LAPAROSCOPIC CHOLECYSTECTOMY WITH INTRAOPERATIVE CHOLANGIOGRAM (N/A) as a surgical intervention.    The patient's history has been reviewed, patient examined, no change in status, stable for surgery.  I have reviewed the patient's chart and labs.  Questions were answered to the patient's satisfaction.    Armandina Gemma, MD [facility] Office: [place]

## 2020-04-30 NOTE — Transfer of Care (Signed)
Immediate Anesthesia Transfer of Care Note  Patient: [name]  Procedure(s) Performed: Procedure(s): LAPAROSCOPIC CHOLECYSTECTOMY WITH INTRAOPERATIVE CHOLANGIOGRAM (N/A)  Patient Location: PACU  Anesthesia Type:General  Level of Consciousness: Alert, Awake, Oriented  Airway & Oxygen Therapy: Patient Spontanous Breathing  Post-op Assessment: Report given to RN  Post vital signs: Reviewed and stable  Last Vitals:  Vitals:   04/30/20 0746  BP: 129/80  Pulse: 96  Resp: 16  Temp: 36.4 C  SpO2: 37%    Complications: No apparent anesthesia complications

## 2020-04-30 NOTE — Discharge Instructions (Signed)
[facility]  LAPAROSCOPIC SURGERY:  POST-OP INSTRUCTIONS  Always review your discharge instruction sheet given to you by the facility where your surgery was performed.  A prescription for pain medication may be given to you upon discharge.  Take your pain medication as prescribed.  If narcotic pain medicine is not needed, then you may take acetaminophen (Tylenol) or ibuprofen (Advil) as needed.  Take your usually prescribed medications unless otherwise directed.  If you need a refill on your pain medication, please contact your pharmacy.  They will contact our office to request authorization. Prescriptions will not be filled after 5 P.M. or on weekends.  You should follow a light diet the first few days after arrival home, such as soup and crackers or toast.  Be sure to include plenty of fluids daily.  Most patients will experience some swelling and bruising in the area of the incisions.  Ice packs will help.  Swelling and bruising can take several days to resolve.   It is common to experience some constipation after surgery.  Increasing fluid intake and taking a stool softener (such as Colace) will usually help or prevent this problem from occurring.  A mild laxative (Milk of Magnesia or Miralax) should be taken according to package instructions if there has been no bowel movement after 48 hours.  You will likely have Dermabond (topical glue) over your incisions.  This seals the incisions and allows you to bathe and shower at any time after your surgery.  Glue should remain in place for up to 10 days.  It may be removed after 10 days by pealing off the Dermabond material or using Vaseline or naval jelly to remove.  If you have steri-strips over your incisions, you may remove the gauze bandage on the second day after surgery, and you may shower at that time.  Leave your steri-strips (small skin tapes) in place directly over the incision.  These strips should remain on the  skin for 5-7 days and then be removed.  You may get them wet in the shower and pat them dry.  Any sutures or staples will be removed at the office during your follow-up visit.  ACTIVITIES:  You may resume regular (light) daily activities beginning the next day - such as daily self-care, walking, climbing stairs - gradually increasing activities as tolerated.  You may have sexual intercourse when it is comfortable.  Refrain from any heavy lifting or straining until approved by your doctor.  You may drive when you are no longer taking prescription pain medication, when you can comfortably wear a seatbelt, and when you can safely maneuver your car and apply brakes.  You should see your doctor in the office for a follow-up appointment approximately 2-3 weeks after your surgery.  Make sure that you call for this appointment within a day or two after you arrive home to insure a convenient appointment time.  WHEN TO CALL YOUR DOCTOR: 1. Fever over 101.0 2. Inability to urinate 3. Continued bleeding from incision 4. Increased pain, redness, or drainage from the incision 5. Increasing abdominal pain  The clinic staff is available to answer your questions during regular business hours.  Please don't hesitate to call and ask to speak to one of the nurses for clinical concerns.  If you have a medical emergency, go to the nearest emergency room or call 911.  A surgeon from [facility] is always on call for the hospital.  Shany Marinez M. Jentry Warnell, MD, FACS [facility] Office: [phone] Toll Free:  [phone] FAX [phone]  Website: [email or web address] 

## 2020-04-30 NOTE — Anesthesia Procedure Notes (Signed)
Procedure Name: Intubation Date/Time: 04/30/2020 9:12 AM Performed by: Gerald Leitz, CRNA Pre-anesthesia Checklist: Patient identified, Patient being monitored, Timeout performed, Emergency Drugs available and Suction available Patient Re-evaluated:Patient Re-evaluated prior to induction Oxygen Delivery Method: Circle system utilized Preoxygenation: Pre-oxygenation with 100% oxygen Induction Type: IV induction Ventilation: Mask ventilation without difficulty Laryngoscope Size: Mac and 3 Grade View: Grade I Tube type: Oral Tube size: 7.0 mm Number of attempts: 1 Placement Confirmation: ETT inserted through vocal cords under direct vision,  positive ETCO2 and breath sounds checked- equal and bilateral Secured at: 21 cm Tube secured with: Tape Dental Injury: Teeth and Oropharynx as per pre-operative assessment

## 2020-05-01 ENCOUNTER — Encounter (HOSPITAL_COMMUNITY): Payer: Self-pay | Admitting: Surgery

## 2020-05-01 DIAGNOSIS — K858 Other acute pancreatitis without necrosis or infection: Secondary | ICD-10-CM | POA: Diagnosis not present

## 2020-05-01 LAB — SURGICAL PATHOLOGY

## 2020-05-01 NOTE — Progress Notes (Signed)
Discharge instructions discussed with patient, verbalized agreement and understanding 

## 2020-05-01 NOTE — Discharge Summary (Signed)
Physician Discharge Summary [facility]  Patient ID: [name] MRN: [id number] DOB/AGE: 60/13/1962 59 y.o.  Admit date: 04/30/2020 Discharge date: 05/01/2020  Admission Diagnoses:  Recurrent episodes of acute pancreatitis  Discharge Diagnoses:  Principal Problem:   Pancreatitis, recurrent Active Problems:   Pancreatic cyst   Idiopathic recurrent acute pancreatitis   Discharged Condition: good  Hospital Course: Patient was admitted for observation following gallbladder surgery.  Post op course was uncomplicated.  Pain was well controlled.  Tolerated diet.  Patient was prepared for discharge home on POD#1.  Consults: None  Treatments: surgery: lap chole with IOC  Discharge Exam: Blood pressure 140/65, pulse 81, temperature 99.2 F (37.3 C), temperature source Oral, resp. rate 16, height 5\' 1"  (1.549 m), weight 48.7 kg, SpO2 95 %. HEENT - clear Neck - soft Chest - clear bilaterally Cor - RRR Abd - soft without distension; wounds dry and intact; mild ecchymosis below umbilical wound; RUQ soft and non-tender without mass  Disposition: Home  Discharge Instructions    Diet - low sodium heart healthy   Complete by: As directed    Discharge instructions   Complete by: As directed    [facility]  LAPAROSCOPIC SURGERY:  POST-OP INSTRUCTIONS  Always review your discharge instruction sheet given to you by the facility where your surgery was performed.  A prescription for pain medication may be given to you upon discharge.  Take your pain medication as prescribed.  If narcotic pain medicine is not needed, then you may take acetaminophen (Tylenol) or ibuprofen (Advil) as needed.  Take your usually prescribed medications unless otherwise directed.  If you need a refill on your pain medication, please contact your pharmacy.  They will contact our office to request authorization. Prescriptions will not be filled after 5 P.M. or on  weekends.  You should follow a light diet the first few days after arrival home, such as soup and crackers or toast.  Be sure to include plenty of fluids daily.  Most patients will experience some swelling and bruising in the area of the incisions.  Ice packs will help.  Swelling and bruising can take several days to resolve.   It is common to experience some constipation after surgery.  Increasing fluid intake and taking a stool softener (such as Colace) will usually help or prevent this problem from occurring.  A mild laxative (Milk of Magnesia or Miralax) should be taken according to package instructions if there has been no bowel movement after 48 hours.  You will likely have Dermabond (topical glue) over your incisions.  This seals the incisions and allows you to bathe and shower at any time after your surgery.  Glue should remain in place for up to 10 days.  It may be removed after 10 days by pealing off the Dermabond material or using Vaseline or naval jelly to remove.  If you have steri-strips over your incisions, you may remove the gauze bandage on the second day after surgery, and you may shower at that time.  Leave your steri-strips (small skin tapes) in place directly over the incision.  These strips should remain on the skin for 5-7 days and then be removed.  You may get them wet in the shower and pat them dry.  Any sutures or staples will be removed at the office during your follow-up visit.  ACTIVITIES:  You may resume regular (light) daily activities beginning the next day - such as daily self-care,  walking, climbing stairs - gradually increasing activities as tolerated.  You may have sexual intercourse when it is comfortable.  Refrain from any heavy lifting or straining until approved by your doctor.  You may drive when you are no longer taking prescription pain medication, when you can comfortably wear a seatbelt, and when you can safely maneuver your car and apply brakes.  You  should see your doctor in the office for a follow-up appointment approximately 2-3 weeks after your surgery.  Make sure that you call for this appointment within a day or two after you arrive home to insure a convenient appointment time.  WHEN TO CALL YOUR DOCTOR: Fever over 101.0 Inability to urinate Continued bleeding from incision Increased pain, redness, or drainage from the incision Increasing abdominal pain  The clinic staff is available to answer your questions during regular business hours.  Please don't hesitate to call and ask to speak to one of the nurses for clinical concerns.  If you have a medical emergency, go to the nearest emergency room or call 911.  A surgeon from [facility] is always on call for the hospital.  Earnstine Regal, MD, [facility] Office: [place] Free:  [place] [phone]  Website: [email or web address]   Increase activity slowly   Complete by: As directed    No dressing needed   Complete by: As directed      Allergies as of 05/01/2020      Reactions   Dilaudid [hydromorphone] Other (See Comments)   Hallucinations   Entresto [sacubitril-valsartan] Swelling, Rash   Welts, possible angioedema   Lopressor [metoprolol Tartrate] Swelling   Angioedema   Allegra Allergy [fexofenadine Hcl] Hives   Nsaids    On Xarelto   Gabapentin (once-daily) Rash   Lipitor [atorvastatin] Diarrhea      Medication List    TAKE these medications   acetaminophen 325 MG tablet Commonly known as: TYLENOL Take 2 tablets (650 mg total) by mouth every 6 (six) hours as needed for mild pain or moderate pain (or Fever >/= 101).   azelastine 0.1 % nasal spray Commonly known as: ASTELIN Place 1-2 sprays into both nostrils 2 (two) times daily as needed for rhinitis. Use in each nostril as directed What changed: when to take this   baclofen 10 MG tablet Commonly known as: LIORESAL Take 10 mg by mouth in  the morning and at bedtime.   calcium carbonate 500 MG chewable tablet Commonly known as: TUMS - dosed in mg elemental calcium Chew 1 tablet by mouth 2 (two) times daily as needed for indigestion or heartburn.   carvedilol 3.125 MG tablet Commonly known as: COREG Take 1 tablet (3.125 mg total) by mouth in the morning and at bedtime.   cholecalciferol 1000 units tablet Commonly known as: VITAMIN D Take 1,000 Units by mouth daily.   enoxaparin 80 MG/0.8ML injection Commonly known as: LOVENOX Inject 0.8 mLs (80 mg total) into the skin daily for 2 days.   ferrous sulfate 325 (65 FE) MG tablet Take 325 mg by mouth 2 (two) times a week.   hydrOXYzine 10 MG tablet Commonly known as: ATARAX/VISTARIL Take 1 tablet (10 mg total) by mouth at bedtime.   levocetirizine 5 MG tablet Commonly known as: XYZAL Take 1 tablet (5 mg total) by mouth every evening.   ondansetron 4 MG disintegrating tablet Commonly known as: Zofran ODT Take 1 tablet (4 mg total) by mouth every 8 (eight) hours as needed for  nausea or vomiting.   oxyCODONE 5 MG immediate release tablet Commonly known as: Oxy IR/ROXICODONE Take 1-2 tablets (5-10 mg total) by mouth every 4 (four) hours as needed for moderate pain.   oxyCODONE 5 MG immediate release tablet Commonly known as: Oxy IR/ROXICODONE Take 1-2 tablets (5-10 mg total) by mouth every 4 (four) hours as needed for moderate pain.   pantoprazole 40 MG tablet Commonly known as: PROTONIX Take 1 tablet (40 mg total) by mouth daily.   pravastatin 20 MG tablet Commonly known as: PRAVACHOL Take 1 tablet (20 mg total) by mouth every evening.   rivaroxaban 20 MG Tabs tablet Commonly known as: Xarelto Take 1 tablet daily with evening meal What changed:   how much to take  how to take this  when to take this  additional instructions   traMADol 50 MG tablet Commonly known as: ULTRAM TAKE 1 TO 2 TABLETS BY MOUTH EVERY 8 HOURS AS NEEDED FOR MODERATE  PAIN What changed: See the new instructions.   vitamin B-12 100 MCG tablet Commonly known as: CYANOCOBALAMIN Take 100 mcg by mouth daily.            Discharge Care Instructions  (From admission, onward)         Start     Ordered   05/01/20 0000  No dressing needed        05/01/20 6553          Follow-up Information    Armandina Gemma, MD. Schedule an appointment as soon as possible for a visit in 4 weeks.   Specialty: General Surgery Why: For wound re-check Contact information: [place] [address] [phone]               Earnstine Regal, MD, [facility] Office: [phone]   Signed: Armandina Gemma 05/01/2020, 9:05 AM

## 2020-05-02 LAB — CUP PACEART REMOTE DEVICE CHECK
Date Time Interrogation Session: 20210803231619
Implantable Pulse Generator Implant Date: 20181203

## 2020-05-04 ENCOUNTER — Ambulatory Visit (INDEPENDENT_AMBULATORY_CARE_PROVIDER_SITE_OTHER): Payer: 59 | Admitting: *Deleted

## 2020-05-04 DIAGNOSIS — I63512 Cerebral infarction due to unspecified occlusion or stenosis of left middle cerebral artery: Secondary | ICD-10-CM

## 2020-05-05 ENCOUNTER — Encounter: Payer: Self-pay | Admitting: Physical Medicine & Rehabilitation

## 2020-05-05 ENCOUNTER — Other Ambulatory Visit: Payer: Self-pay

## 2020-05-05 ENCOUNTER — Encounter: Payer: 59 | Attending: Physical Medicine & Rehabilitation | Admitting: Physical Medicine & Rehabilitation

## 2020-05-05 VITALS — BP 113/72 | HR 97 | Temp 97.9°F | Ht 61.0 in | Wt 105.8 lb

## 2020-05-05 DIAGNOSIS — R269 Unspecified abnormalities of gait and mobility: Secondary | ICD-10-CM | POA: Insufficient documentation

## 2020-05-05 DIAGNOSIS — M170 Bilateral primary osteoarthritis of knee: Secondary | ICD-10-CM | POA: Insufficient documentation

## 2020-05-05 DIAGNOSIS — G8110 Spastic hemiplegia affecting unspecified side: Secondary | ICD-10-CM | POA: Insufficient documentation

## 2020-05-05 DIAGNOSIS — G5602 Carpal tunnel syndrome, left upper limb: Secondary | ICD-10-CM

## 2020-05-05 NOTE — Progress Notes (Addendum)
Subjective:    Patient ID: [name], female    DOB: Apr 29, 1961, 59 y.o.   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    Last clinic visit 10/30/19.  She had b/l steroid injections in her knee at that time with Dr. Adam Phenix.  She does not believe she had much benefit. Since that time, she states she had gall bladder surgery. She is not wearing her brace. She is using Baclofen BID. She is not wearing AFO. Denies falls. Overall, rash is improving.  Pain Inventory Average Pain 7 Pain Right Now 8 My pain is burning, tingling and aching  In the last 24 hours, has pain interfered with the following? General activity 6 Relation with others 6 Enjoyment of life 6 What TIME of day is your pain at its worst? morning Sleep (in general) Fair  Pain is worse with: walking and standing Pain improves with: rest, medication and injections Relief from Meds: 4  Mobility walk without assistance how many minutes can you walk? 10 mins ability to climb steps?  yes do you drive?  yes Do you have any goals in this area?  yes  Function retired I need assistance with the following:  shopping  Neuro/Psych No problems in this area tingling trouble walking  Prior Studies Any changes since last visit?  yes Gallbladder surgery 04/30/2020  Physicians involved in your care Any changes since last visit?  yes Dr. Harlow Asa    Family History  Problem Relation Age of Onset  . Heart attack Father   . Hypertension Mother   . Hyperlipidemia Mother   . Diabetes type II Mother   . Urticaria Mother   . Hypertension Brother   . Breast cancer Cousin 79  . Immunodeficiency Neg Hx   . Eczema Neg Hx   . Atopy Neg Hx   . Asthma Neg Hx   . Angioedema Neg Hx   . Allergic rhinitis Neg Hx   . Colon cancer Neg  Hx   . Esophageal cancer Neg Hx   . Stomach cancer Neg Hx   . Pancreatic cancer Neg Hx   . Colon polyps Neg Hx   . Esophageal varices Neg Hx   . Inflammatory bowel disease Neg Hx   . Rectal cancer Neg Hx    Social History   Socioeconomic History  . Marital status: Married    Spouse name: Not on file  . Number of children: 2  . Years of education: Not on file  . Highest education level: Not on file  Occupational History  . Not on file  Tobacco Use  . Smoking status: Current Some Day Smoker    Packs/day: 0.10    Years: 37.00    Pack years: 3.70    Types: Cigarettes  . Smokeless tobacco: Never Used  Vaping Use  . Vaping Use: Never used  Substance and Sexual Activity  . Alcohol use: No  . Drug use: No  . Sexual activity: Yes    Partners: Male  Other Topics Concern  . Not on file  Social History Narrative   Married. 2 children.    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    Walks with cane or wheelchair.    Former smoker.    Drinks caffeine.    Smoke alarm in the home.   Firearms in the  home.    Wears her seatbelt.    Feels safe In her relationships.    Social Determinants of Health   Financial Resource Strain:   . Difficulty of Paying Living Expenses:   Food Insecurity:   . Worried About Charity fundraiser in the Last Year:   . Arboriculturist in the Last Year:   Transportation Needs:   . Film/video editor (Medical):   [name] Lack of Transportation (Non-Medical):   Physical Activity:   . Days of Exercise per Week:   . Minutes of Exercise per Session:   Stress:   . Feeling of Stress :   Social Connections:   . Frequency of Communication with Friends and Family:   . Frequency of Social Gatherings with Friends and Family:   . Attends Religious Services:   . Active Member of Clubs or Organizations:   . Attends Archivist Meetings:   [name] Marital Status:    Past Surgical History:  Procedure Laterality Date  . BIOPSY  12/02/2019   Procedure: BIOPSY;   Surgeon: Rush Landmark Telford Nab., MD;  Location: [place];  Service: Gastroenterology;;  . CHOLECYSTECTOMY N/A 04/30/2020   Procedure: LAPAROSCOPIC CHOLECYSTECTOMY WITH INTRAOPERATIVE CHOLANGIOGRAM;  Surgeon: Armandina Gemma, MD;  Location: [facility];  Service: General;  Laterality: N/A;  . CORONARY ARTERY BYPASS GRAFT N/A 11/23/2017   Procedure: CORONARY ARTERY BYPASS GRAFTING (CABG) x 2, ON PUMP, USING LEFT INTERNAL MAMMARY ARTERY AND RIGHT GREATER SAPHENOUS VEIN HARVESTED ENDOSCOPICALLY;  Surgeon: Gaye Pollack, MD;  Location: [place];  Service: Open Heart Surgery;  Laterality: N/A;  . CRYOABLATION     cervical  . ESOPHAGOGASTRODUODENOSCOPY (EGD) WITH PROPOFOL N/A 12/02/2019   Procedure: ESOPHAGOGASTRODUODENOSCOPY (EGD) WITH PROPOFOL;  Surgeon: Rush Landmark Telford Nab., MD;  Location: [place];  Service: Gastroenterology;  Laterality: N/A;  . ESOPHAGOGASTRODUODENOSCOPY (EGD) WITH PROPOFOL N/A 03/19/2020   Procedure: ESOPHAGOGASTRODUODENOSCOPY (EGD) WITH PROPOFOL;  Surgeon: Milus Banister, MD;  [facility];  Service: Endoscopy;  Laterality: N/A;  . EUS N/A 03/19/2020   Procedure: UPPER ENDOSCOPIC ULTRASOUND (EUS) RADIAL;  Surgeon: Milus Banister, MD;  [facility];  Service: Endoscopy;  Laterality: N/A;  . EUS N/A 03/19/2020   Procedure: UPPER ENDOSCOPIC ULTRASOUND (EUS) LINEAR;  Surgeon: Milus Banister, MD;  [facility];  Service: Endoscopy;  Laterality: N/A;  . FINE NEEDLE ASPIRATION  12/02/2019   Procedure: FINE NEEDLE ASPIRATION (FNA) LINEAR;  Surgeon: Irving Copas., MD;  Location: [place];  Service: Gastroenterology;;  . FINE NEEDLE ASPIRATION N/A 03/19/2020   Procedure: FINE NEEDLE ASPIRATION (FNA) LINEAR;  Surgeon: Milus Banister, MD;  [facility];  Service: Endoscopy;  Laterality: N/A;  . IR ANGIO INTRA EXTRACRAN SEL COM CAROTID INNOMINATE UNI R MOD SED  08/21/2017  . IR ANGIO VERTEBRAL SEL SUBCLAVIAN INNOMINATE UNI R MOD SED   08/21/2017  . IR PERCUTANEOUS ART THROMBECTOMY/INFUSION INTRACRANIAL INC DIAG ANGIO  08/21/2017  . IR RADIOLOGIST EVAL & MGMT  10/30/2017  . LOOP RECORDER INSERTION N/A 08/28/2017   Procedure: LOOP RECORDER INSERTION;  Surgeon: Constance Haw, MD;  Location: [place] [facility];  Service: Cardiovascular;  Laterality: N/A;  . RADIOLOGY WITH ANESTHESIA N/A 08/21/2017   Procedure: RADIOLOGY WITH ANESTHESIA;  Surgeon: Luanne Bras, MD;  Location: [place];  Service: Radiology;  Laterality: N/A;  . RIGHT/LEFT HEART CATH AND CORONARY ANGIOGRAPHY N/A 11/20/2017   Procedure: RIGHT/LEFT HEART CATH AND CORONARY ANGIOGRAPHY;  Surgeon: Jettie Booze, MD;  Location:  [place] [facility];  Service: Cardiovascular;  Laterality: N/A;  . TEE WITHOUT CARDIOVERSION N/A 08/28/2017   Procedure: TRANSESOPHAGEAL ECHOCARDIOGRAM (TEE);  Surgeon: Fay Records, MD;  Location: [place];  Service: Cardiovascular;  Laterality: N/A;  . TEE WITHOUT CARDIOVERSION N/A 11/23/2017   Procedure: TRANSESOPHAGEAL ECHOCARDIOGRAM (TEE);  Surgeon: Gaye Pollack, MD;  Location: [place];  Service: Open Heart Surgery;  Laterality: N/A;  . TUBAL LIGATION    . UPPER ESOPHAGEAL ENDOSCOPIC ULTRASOUND (EUS) N/A 12/02/2019   Procedure: UPPER ESOPHAGEAL ENDOSCOPIC ULTRASOUND (EUS);  Surgeon: Irving Copas., MD;  Location: [place];  Service: Gastroenterology;  Laterality: N/A;   Past Medical History:  Diagnosis Date  . AAA (abdominal aortic aneurysm) ([place])    a. 3.3cm infrarenal by CT 02/2020.  . Angio-edema   . Arthritis   . CAD (coronary artery disease)    a. s/p NSTEMI in 10/2017 with cath showing LM disease --> s/p CABG on 11/23/2017 with LIMA-LAD and SVG-OM.   [name] Carotid stenosis    a. Duplex 11/2018 6-65% RICA, 99-35% LICA.  [name] Gastritis and duodenitis 12/06/2019   11/2019 Peptic duodenitis  - No dysplasia or malignancy identified  B. STOMACH, BIOPSY:  - Mild chronic gastritis with intestinal metaplasia   . Hay  fever   . History of kidney stones   . Ischemic cardiomyopathy    a. EF 35-40% by echo in 10/2017. b. 55% by echo 01/2020.  . NSTEMI (non-ST elevated myocardial infarction) ([place])   . PAF (paroxysmal atrial fibrillation) ([place])    Diagnosed by ILR  . Pancreatitis   . Pilonidal cyst 10/21/2019  . Pneumonia 11/16/2017  . Stroke [facility]) 08/23/2017   Left MCA infarct status post TPA and thrombectomy  . Urticaria    BP 113/72   Pulse 97   Temp 97.9 F (36.6 C)   Ht 5\' 1"  (1.549 m)   Wt 105 lb 12.8 oz (48 kg)   LMP  (LMP Unknown) Comment: at age 19  SpO2 94%   BMI 19.99 kg/m   Opioid Risk Score:   Fall Risk Score:  `1  Depression screen PHQ 2/9  Depression screen [facility] 2/9 05/05/2020 11/22/2018 10/22/2018 10/22/2018 10/19/2018  Decreased Interest 0 0 0 0 0  Down, Depressed, Hopeless 0 0 0 0 0  PHQ - 2 Score 0 0 0 0 0  Altered sleeping - - 0 - -  Tired, decreased energy - - 1 - -  Change in appetite - - 0 - -  Feeling bad or failure about yourself  - - 0 - -  Trouble concentrating - - 0 - -  Moving slowly or fidgety/restless - - 0 - -  Suicidal thoughts - - 0 - -  PHQ-9 Score - - 1 - -  Difficult doing work/chores - - Not difficult at all - -  Some recent data might be hidden   Review of Systems  Constitutional: Negative.   HENT: Negative.   Eyes: Negative.   Respiratory: Negative.   Gastrointestinal: Negative.   Endocrine: Negative.   Genitourinary: Negative.   Musculoskeletal: Positive for arthralgias.  Skin: Negative.   Allergic/Immunologic: Negative.   Neurological: Positive for weakness and numbness.  Hematological: Negative.   Psychiatric/Behavioral: Negative.       Objective:   Physical Exam Constitutional: NAD. Vital signs reviewed. HENT: Normocephalic.  Atraumatic. Eyes: EOMI. No discharge. Cardiovascular: No JVD. Respiratory: Normal effort.  No stridor. GI: Non-distended. Skin: Warm and dry.  Intact. Psych: Normal mood.  Normal behavior. Musc: No edema in  extremities.  No tenderness in extremities. No TTP b/l knees Gait: Hemiparetic- improving Neurological: Alert Motor: Right upper extremity: 3-/5 shoulder abduction, 4+/5 elbow flex/ext, 4+/5 hand grip  Right lower extremity: Hip flexion 4+/5, knee extension 4+/5, ADF/PF 4+/5     Assessment & Plan:  Right-handed female with unremarkable past history except tobacco abuse, on no prescription medication, presents for follow up for left MCA infarct status post TPA and thrombectomy and cardiac debility.  1. 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 Status post loop recorder  Patient states more discomfort/irration PRAFO/WHO/AFO, awaiting resolution of rash  Cont Fluoxetine  She followed up with Vascular, no further intervention at present  Continue HEP  2. Spastic hemiplegia  Continue Baclofen to 10 TID (typically takes BID), good benefit  Will hold off on Botox at this time  3. Neurologic gait abnormality  Foot drag improved, improved with cognizant ambulation  No longer requiring quad cane  Does not need AFO anymore  Cont HEP  4. Bilateral knee OA  B/l Zilretta injections performed on 03/23/18 - with good benefit  Steroid injection on 5/28 with benefit, minimal benefit in 10/2019  Synvisc b/l on 10/05/2018 with benefit, will schedule Monovisc injection  Encouraged trial Lidoderm patch, reminded againx5  Xrays reviewed, medial > lateral OA b/l  Continue Voltaren gel   Will likely require referral to Ortho  5. Left CTS  Allergic to Gabapentin  Will consider Pregabalin after Allergy appointment  NCS/EMG showing L>R CTS  Cont bracing   Trial of Voltaren gel on wrist  Will consider steroid injection after knee injections   6. Rash  Continue to follow up with Allergy

## 2020-05-05 NOTE — Progress Notes (Signed)
[facility] Summary Report / Loop Recorder 

## 2020-05-07 ENCOUNTER — Encounter: Payer: Self-pay | Admitting: Gastroenterology

## 2020-05-07 ENCOUNTER — Other Ambulatory Visit: Payer: Self-pay

## 2020-05-07 ENCOUNTER — Ambulatory Visit (INDEPENDENT_AMBULATORY_CARE_PROVIDER_SITE_OTHER): Payer: 59 | Admitting: Gastroenterology

## 2020-05-07 VITALS — BP 102/62 | HR 91 | Ht 61.0 in | Wt 106.0 lb

## 2020-05-07 DIAGNOSIS — K5900 Constipation, unspecified: Secondary | ICD-10-CM | POA: Diagnosis not present

## 2020-05-07 DIAGNOSIS — K31A0 Gastric intestinal metaplasia, unspecified: Secondary | ICD-10-CM

## 2020-05-07 DIAGNOSIS — K3189 Other diseases of stomach and duodenum: Secondary | ICD-10-CM

## 2020-05-07 DIAGNOSIS — K828 Other specified diseases of gallbladder: Secondary | ICD-10-CM

## 2020-05-07 DIAGNOSIS — M546 Pain in thoracic spine: Secondary | ICD-10-CM

## 2020-05-07 DIAGNOSIS — K859 Acute pancreatitis without necrosis or infection, unspecified: Secondary | ICD-10-CM | POA: Diagnosis not present

## 2020-05-07 DIAGNOSIS — R198 Other specified symptoms and signs involving the digestive system and abdomen: Secondary | ICD-10-CM

## 2020-05-07 DIAGNOSIS — K862 Cyst of pancreas: Secondary | ICD-10-CM

## 2020-05-07 NOTE — Patient Instructions (Addendum)
If you are age 61 or older, your body mass index should be between 23-30. Your Body mass index is 20.03 kg/m. If this is out of the aforementioned range listed, please consider follow up with your Primary Care Provider.  If you are age 75 or younger, your body mass index should be between 19-25. Your Body mass index is 20.03 kg/m. If this is out of the aformentioned range listed, please consider follow up with your Primary Care Provider.   You have been scheduled for an MRI/MRCP abdomen and pelvis at [facility] on 07/07/20. Your appointment time is 12 noon. Please arrive 30 minutes prior to your appointment time for registration purposes. Please make certain not to have anything to eat or drink 6 hours prior to your test. In addition, if you have any metal in your body, have a pacemaker or defibrillator, please be sure to let your ordering physician know. This test typically takes 45 minutes to 1 hour to complete. Should you need to reschedule, please call [phone] to do so.  Take Colace 100 mg twice daily, then decrease to once daily, then as needed.  Follow up in three months.  Please call the office for an appointment as the schedule is not available at this time.  It was a pleasure to see you today!  Vito Cirigliano, D.O.

## 2020-05-07 NOTE — Progress Notes (Signed)
P  Chief Complaint:    Hospital follow-up, acute recurrent pancreatitis  GI History: 59 year old female with a history of CVA, CAD/two-vessel CABG, CHF, atrial fibrillation (on Xarelto),follow-up in GI clinic for acute recurrent pancreatitis of undiagnosed etiology.  -Admitted 12/15-18,2020 with acute pancreatitis with unclear etiology. -CT notable for 2.2 cm cyst versus pseudocyst in the head of pancreas.  -Readmitted 1/17-18, 2021 with acute recurrent pancreatitis. -CT acute pancreatitis, slight improvement in size of pseudocyst, wall thickening of prepyloric stomach and proximal duodenum (possibly reactive) -IgG4 negative -Lasix stopped  -Readmitted 2/10-13, 2021 with acute recurrent pancreatitis -Lipase>10k, improvedto 837 then 74 by d/c -WBC 17.5, hemoconcentration, normal BUN/creatinine -Blood cultures negative -Admission CT with possible cystic duct stones peripancreatic phlegmon and edemac/wacute pancreatitis. Stable cystic lesion in HOP -RUQ [place]: No gallstones, CBD 5 mm, hypoechoic HOP lesion (cystic) -MRI/MRCP 09/05/2020: Hepatic steatosis, mild GB distention, focally ectatic CBD measuring 8 mm without CDL. Pancreatic edema c/w AP, 1.8 x 1.3 cm heterogenous structure (favoring pseudocyst), previously 1.8 x 1.9 cm. Cystic duct stones not appreciated. -Liver enzymes remained normal, but consideration for microlithiasis as etiology -Stopped Entresto  -Readmitted 3/5-9, 2021 -CT: Decreased peripancreatic inflammation around the head/uncinate process, but now inflammation around first/second portions of duodenum with duodenal wall thickening.  Stable pancreatic head pseudocyst -EUS (12/02/2019, [name] Landmark): 1.2 cm pancreatic cyst (FNA: No malignancy), Biopsy of HOP (no malignancy).  Peptic duodenitis, gastritis with intestinal metaplasia without H. pylori  -Readmitted 6/22-29, 2021 with acute recurrent idiopathic pancreatitis, which was extensively evaluated as  below: -CT 03/17/2020: 3.1 x 2.6 cm rounded density in pancreatic head increased in size since May 2021, lytic destruction of right greater trochanter with associated pathologic fracture -MRCP 03/19/2020: 2.5 x 2.4 cm complex fluid collection in HOP with subtle rim enhancement -EUS 03/19/2020: Abnormal HOP without discrete solid or anechoic cystic lesions (FNA: No malignancy).  Normal CBD.  Recommend repeat MRI in 2-3 months and ccy -MRI right femur 03/20/2020: Enhancing 4 cm marrow replacing lesion and greater trochanter with pathologic fracture -Whole-body scan 03/23/2020: Abnormal uptake in right greater trochanter, uptake at healing lateral left ninth rib fracture, nonspecific uptake at C6/C7 -IR right femur biopsy 03/23/2020: No malignancy  - Cholecystectomy with IOC on 10/01/1094 with uncomplicated postop course and discharged the following day (GB path: cholelithiasis)  -Developed rash with pantoprazole.  Stopped and changed to Pepcid for treatment of gastritis/duodenitis without issue.  HPI:     Patient is a 59 y.o. female presenting to the Gastroenterology Clinic for follow-up.  Hospitalized 6/22-29 with acute recurrent idiopathic pancreatitis, which was extensively evaluated as outlined above.   Cholecystectomy with IOC on 0/12/5407 with uncomplicated postop course and discharged the following day. Has f/u with Dr. Harlow Asa on 05/27/20.   Today, she states she is having back pain that she thinks started 1 month after most recent hospital discharge.  Pain located in the left mid back.  Worse with motion. Improves with APAP prn.   Some consitipation after postoperative pain meds- will start Colace today.   Otherwise, she states she is feeling much better.  Appetite slowly improving (had been doing broth based diet). Drinking Boost to increase weight again (had lost 25# over the last couple years since CVA and CABG and more recent medical issues).    Review of systems:     No chest pain, no  SOB, no fevers, no urinary sx   Past Medical History:  Diagnosis Date  . AAA (abdominal aortic aneurysm) ([place])  a. 3.3cm infrarenal by CT 02/2020.  . Angio-edema   . Arthritis   . CAD (coronary artery disease)    a. s/p NSTEMI in 10/2017 with cath showing LM disease --> s/p CABG on 11/23/2017 with LIMA-LAD and SVG-OM.   [name] Carotid stenosis    a. Duplex 11/2018 9-83% RICA, 38-25% LICA.  [name] Gastritis and duodenitis 12/06/2019   11/2019 Peptic duodenitis  - No dysplasia or malignancy identified  B. STOMACH, BIOPSY:  - Mild chronic gastritis with intestinal metaplasia   . Hay fever   . History of kidney stones   . Ischemic cardiomyopathy    a. EF 35-40% by echo in 10/2017. b. 55% by echo 01/2020.  . NSTEMI (non-ST elevated myocardial infarction) ([place])   . PAF (paroxysmal atrial fibrillation) ([place])    Diagnosed by ILR  . Pancreatitis   . Pilonidal cyst 10/21/2019  . Pneumonia 11/16/2017  . Stroke [facility] 08/23/2017   Left MCA infarct status post TPA and thrombectomy  . Urticaria     Patient's surgical history, family medical history, social history, medications and allergies were all reviewed in Epic    Current Outpatient Medications  Medication Sig Dispense Refill  . acetaminophen (TYLENOL) 325 MG tablet Take 2 tablets (650 mg total) by mouth every 6 (six) hours as needed for mild pain or moderate pain (or Fever >/= 101).    [name] azelastine (ASTELIN) 0.1 % nasal spray Place 1-2 sprays into both nostrils 2 (two) times daily as needed for rhinitis. Use in each nostril as directed (Patient taking differently: Place 1-2 sprays into both nostrils daily as needed for rhinitis. Use in each nostril as directed) 30 mL 5  . baclofen (LIORESAL) 10 MG tablet Take 10 mg by mouth in the morning and at bedtime.    . calcium carbonate (TUMS - DOSED IN MG ELEMENTAL CALCIUM) 500 MG chewable tablet Chew 1 tablet by mouth 2 (two) times daily as needed for indigestion or heartburn.     . carvedilol (COREG) 3.125  MG tablet Take 1 tablet (3.125 mg total) by mouth in the morning and at bedtime. 180 tablet 3  . cholecalciferol (VITAMIN D) 1000 units tablet Take 1,000 Units by mouth daily.    . ferrous sulfate 325 (65 FE) MG tablet Take 325 mg by mouth 2 (two) times a week.     . hydrOXYzine (ATARAX/VISTARIL) 10 MG tablet Take 1 tablet (10 mg total) by mouth at bedtime. 90 tablet 1  . levocetirizine (XYZAL) 5 MG tablet Take 1 tablet (5 mg total) by mouth every evening. 180 tablet 1  . ondansetron (ZOFRAN ODT) 4 MG disintegrating tablet Take 1 tablet (4 mg total) by mouth every 8 (eight) hours as needed for nausea or vomiting. 30 tablet 0  . oxyCODONE (OXY IR/ROXICODONE) 5 MG immediate release tablet Take 1-2 tablets (5-10 mg total) by mouth every 4 (four) hours as needed for moderate pain. 20 tablet 0  . pantoprazole (PROTONIX) 40 MG tablet Take 1 tablet (40 mg total) by mouth daily. 90 tablet 0  . pravastatin (PRAVACHOL) 20 MG tablet Take 1 tablet (20 mg total) by mouth every evening. 90 tablet 1  . rivaroxaban (XARELTO) 20 MG TABS tablet Take 1 tablet daily with evening meal (Patient taking differently: Take 20 mg by mouth daily with supper. ) 90 tablet 3  . traMADol (ULTRAM) 50 MG tablet TAKE 1 TO 2 TABLETS BY MOUTH EVERY 8 HOURS AS NEEDED FOR MODERATE PAIN (Patient taking differently: Take 50-100 mg  by mouth at bedtime. ) 180 tablet 3  . vitamin B-12 (CYANOCOBALAMIN) 100 MCG tablet Take 100 mcg by mouth daily.     No current facility-administered medications for this visit.    Physical Exam:     BP 102/62   Pulse 91   Ht 5\' 1"  (1.549 m)   Wt 106 lb (48.1 kg)   LMP  (LMP Unknown) Comment: at age 66  BMI 20.03 kg/m   GENERAL:  Pleasant female in NAD. Walks with cane assist PSYCH: : Cooperative, normal affect EENT:  conjunctiva pink, mucous membranes moist, neck supple without masses ABDOMEN:  Post ccy incisions c/d/i.  SKIN:  turgor, no lesions seen Musculoskeletal:  Palpable knot in left mid  back with TTP. Right sided weakness (chronic s/p CVA) NEURO: Alert and oriented x 3, no focal neurologic deficits   IMPRESSION and PLAN:    1) Idiopathic recurrent acute pancreatitis 2) GB sludge now s/p cholecystectomy 3) Abnormal pancreas on imaging and EUS  -Has had an extensive evaluation as outlined above.  She is now s/p cholecystectomy earlier this month.  GB pathology with cholelithiasis.  Hopefully this GB sludge was the inciting etiology and no further recurrence -Repeat MRI/MRCP in 2 months for reevaluation of previously identified pancreatic lesion -If recurrent episode of acute pancreatitis despite cholecystectomy earlier this month, plan for referral to [place] [facility]), genetic evaluation, etc. - Ok to resume ursodiol for now. Can likely d/c after f/u with surgeon in a couple weeks      4) Back pain: - MSK pain with palpable knot in back.  - Recommended conservative management with heating pad, stretching, avoid exacerbating activities (ie, reaching with barn work), and APAP prn - If not improving, can f/u with PCM and possibly PT referral  5) Constipation -Was previously well controlled, with recent recurrence likely related to recent surgery/postoperative pain medications -Start Colace 100 mg twice daily -No longer taking oxycodone.  Still taking tramadol (constipation and 9%), but starting to limit the use of that as well -Continue adequate hydration, increasing dietary fiber  6) Gastric intestinal metaplasia -Gastritis with intestinal metaplasia on EUS earlier this year, but otherwise normal-appearing stomach on repeat EUS last month -Could consider EGD with GIM mapping in the future  RTC in 3 months or sooner as needed  I spent 35 minutes of time, including in depth chart review, independent review of results as outlined above, communicating results with the patient directly, face-to-face time with the patient, coordinating care, and ordering  studies and medications as appropriate, and documentation.       [place] ,DO, FACG 05/07/2020, 11:12 AM

## 2020-05-13 ENCOUNTER — Other Ambulatory Visit: Payer: Self-pay | Admitting: Physical Medicine & Rehabilitation

## 2020-05-26 ENCOUNTER — Telehealth: Payer: Self-pay | Admitting: Cardiology

## 2020-05-26 MED ORDER — RIVAROXABAN 20 MG PO TABS: ORAL_TABLET | ORAL | 11 refills | Status: DC

## 2020-05-26 NOTE — Telephone Encounter (Signed)
Refill complete 

## 2020-05-26 NOTE — Telephone Encounter (Signed)
New message      *STAT* If patient is at the pharmacy, call can be transferred to refill team.   1. Which medications need to be refilled? (please list name of each medication and dose if known) xarelto - completely out does not have any for evening dose   2. Which pharmacy/location (including street and city if local pharmacy) is medication to be sent to? Did not specify   3. Do they need a 30 day or 90 day supply? [place]

## 2020-06-08 ENCOUNTER — Other Ambulatory Visit: Payer: Self-pay | Admitting: Physical Medicine & Rehabilitation

## 2020-06-08 ENCOUNTER — Ambulatory Visit (INDEPENDENT_AMBULATORY_CARE_PROVIDER_SITE_OTHER): Payer: 59 | Admitting: *Deleted

## 2020-06-08 DIAGNOSIS — I63512 Cerebral infarction due to unspecified occlusion or stenosis of left middle cerebral artery: Secondary | ICD-10-CM | POA: Diagnosis not present

## 2020-06-08 LAB — CUP PACEART REMOTE DEVICE CHECK
Date Time Interrogation Session: 20210905233444
Implantable Pulse Generator Implant Date: 20181203

## 2020-06-10 NOTE — Progress Notes (Signed)
[facility] Summary Report / Loop Recorder 

## 2020-06-16 ENCOUNTER — Ambulatory Visit (INDEPENDENT_AMBULATORY_CARE_PROVIDER_SITE_OTHER): Payer: 59 | Admitting: Family Medicine

## 2020-06-16 ENCOUNTER — Encounter: Payer: Self-pay | Admitting: Family Medicine

## 2020-06-16 ENCOUNTER — Ambulatory Visit (HOSPITAL_BASED_OUTPATIENT_CLINIC_OR_DEPARTMENT_OTHER)
Admission: RE | Admit: 2020-06-16 | Discharge: 2020-06-16 | Disposition: A | Discharge status: Home / Self Care | Payer: 59 | Source: Ambulatory Visit | Attending: Family Medicine | Admitting: Family Medicine

## 2020-06-16 ENCOUNTER — Other Ambulatory Visit: Payer: Self-pay

## 2020-06-16 VITALS — BP 106/71 | HR 106 | Temp 98.4°F | Ht 61.0 in | Wt 107.0 lb

## 2020-06-16 DIAGNOSIS — K861 Other chronic pancreatitis: Secondary | ICD-10-CM | POA: Diagnosis not present

## 2020-06-16 DIAGNOSIS — R109 Unspecified abdominal pain: Secondary | ICD-10-CM | POA: Insufficient documentation

## 2020-06-16 DIAGNOSIS — Z23 Encounter for immunization: Secondary | ICD-10-CM

## 2020-06-16 DIAGNOSIS — K859 Acute pancreatitis without necrosis or infection, unspecified: Secondary | ICD-10-CM | POA: Diagnosis not present

## 2020-06-16 DIAGNOSIS — R319 Hematuria, unspecified: Secondary | ICD-10-CM

## 2020-06-16 DIAGNOSIS — M546 Pain in thoracic spine: Secondary | ICD-10-CM

## 2020-06-16 LAB — POCT URINALYSIS DIPSTICK
Bilirubin, UA: NEGATIVE
Blood, UA: 200
Glucose, UA: NEGATIVE
Ketones, UA: 80
Nitrite, UA: NEGATIVE
Protein, UA: POSITIVE — AB
Spec Grav, UA: 1.02 (ref 1.010–1.025)
Urobilinogen, UA: 0.2 E.U./dL
pH, UA: 6 (ref 5.0–8.0)

## 2020-06-16 MED ORDER — SENNOSIDES-DOCUSATE SODIUM 8.6-50 MG PO TABS
ORAL_TABLET | ORAL | 0 refills | Status: DC
Start: 2020-06-16 — End: 2020-10-20

## 2020-06-16 MED ORDER — CEFTRIAXONE SODIUM 1 G IJ SOLR
1.0000 g | Freq: Once | INTRAMUSCULAR | Status: AC
Start: 2020-06-16 — End: 2020-06-16
  Administered 2020-06-16: 1 g via INTRAMUSCULAR

## 2020-06-16 MED ORDER — CEFDINIR 300 MG PO CAPS
300.0000 mg | ORAL_CAPSULE | Freq: Two times a day (BID) | ORAL | 0 refills | Status: DC
Start: 2020-06-16 — End: 2020-10-16

## 2020-06-16 MED ORDER — POLYETHYLENE GLYCOL 3350 17 G PO PACK: 17.0000 g | PACK | Freq: Two times a day (BID) | ORAL | 0 refills | Status: DC

## 2020-06-16 NOTE — Progress Notes (Signed)
This visit occurred during the SARS-CoV-2 public health emergency.  Safety protocols were in place, including screening questions prior to the visit, additional usage of staff PPE, and extensive cleaning of exam room while observing appropriate contact time as indicated for disinfecting solutions.    [name] , November 11, 1960, 59 y.o., female MRN: [id number] Patient Care Team    Relationship Specialty Notifications Start End  [name], DO PCP - General Family Medicine  10/13/17   Constance Haw, MD PCP - Electrophysiology Cardiology Admissions 08/28/17   Satira Sark, MD PCP - Cardiology Cardiology Admissions 12/15/17   Rosalin Hawking, MD Consulting Physician Neurology  10/13/17   Jamse Arn, MD Consulting Physician Physical Medicine and Rehabilitation  10/13/17   Luanne Bras, MD Consulting Physician Interventional Radiology  10/13/17   Mansouraty, Telford Nab., MD Consulting Physician Gastroenterology  03/03/20   Armandina Gemma, MD Consulting Physician General Surgery  04/09/20   Lucas Mallow, MD Consulting Physician Urology  04/09/20     Chief Complaint  Patient presents with  . Abdominal Pain    pt c/o periumbilical abdominal pain that radiates to the back b/l, loss of appetite and urine hesistency x 3 days;      Subjective: Pt presents for an OV with complaints of Neri hesitancy, abdominal discomfort and lower back discomfort.  Patient reports she noticed her symptoms started about 3 days ago.  She endorses not having any appetite, and last ate on Saturday.  She is trying to drink water and hydrate.  She denies nausea or vomit, lack of appetite only.  She feels like she is having sweats at times.  She reports her body is aching.  She is constipated and tried using an enema yesterday with out great results.  She states she did have a very small bowel movement only.  She is passing gas.  She reports discomfort of her bilateral lower back.  She has a  significant medical history of kidney stones, recurrent pancreatitis with pancreatic cyst.  She recently had a cholecystectomy about 4 weeks ago, without complications.  Patient reports she feels fatigued and weak.  Depression screen [facility] 2/9 05/05/2020 11/22/2018 10/22/2018 10/22/2018 10/19/2018  Decreased Interest 0 0 0 0 0  Down, Depressed, Hopeless 0 0 0 0 0  PHQ - 2 Score 0 0 0 0 0  Altered sleeping - - 0 - -  Tired, decreased energy - - 1 - -  Change in appetite - - 0 - -  Feeling bad or failure about yourself  - - 0 - -  Trouble concentrating - - 0 - -  Moving slowly or fidgety/restless - - 0 - -  Suicidal thoughts - - 0 - -  PHQ-9 Score - - 1 - -  Difficult doing work/chores - - Not difficult at all - -  Some recent data might be hidden    Allergies  Allergen Reactions  . Dilaudid [Hydromorphone] Other (See Comments)    Hallucinations  . Entresto [Sacubitril-Valsartan] Swelling and Rash    Welts, possible angioedema   . Lopressor [Metoprolol Tartrate] Swelling    Angioedema  . Allegra Allergy [Fexofenadine Hcl] Hives  . Nsaids     On Xarelto  . Gabapentin (Once-Daily) Rash  . Lipitor [Atorvastatin] Diarrhea   Social History   Social History Narrative   Married. 2 children.    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    Walks with cane or wheelchair.    Former  smoker.    Drinks caffeine.    Smoke alarm in the home.   Firearms in the home.    Wears her seatbelt.    Feels safe In her relationships.    Past Medical History:  Diagnosis Date  . AAA (abdominal aortic aneurysm) ([place])    a. 3.3cm infrarenal by CT 02/2020.  . Angio-edema   . Arthritis   . CAD (coronary artery disease)    a. s/p NSTEMI in 10/2017 with cath showing LM disease --> s/p CABG on 11/23/2017 with LIMA-LAD and SVG-OM.   [name] Carotid stenosis    a. Duplex 11/2018 6-44% RICA, 03-47% LICA.  [name] Gastritis and duodenitis 12/06/2019   11/2019 Peptic duodenitis  - No dysplasia or malignancy identified  B. STOMACH,  BIOPSY:  - Mild chronic gastritis with intestinal metaplasia   . Hay fever   . History of kidney stones   . Ischemic cardiomyopathy    a. EF 35-40% by echo in 10/2017. b. 55% by echo 01/2020.  . NSTEMI (non-ST elevated myocardial infarction) ([place])   . PAF (paroxysmal atrial fibrillation) ([place])    Diagnosed by ILR  . Pancreatitis   . Pilonidal cyst 10/21/2019  . Pneumonia 11/16/2017  . Stroke [facility]) 08/23/2017   Left MCA infarct status post TPA and thrombectomy  . Urticaria    Past Surgical History:  Procedure Laterality Date  . BIOPSY  12/02/2019   Procedure: BIOPSY;  Surgeon: Rush Landmark Telford Nab., MD;  Location: [place];  Service: Gastroenterology;;  . CHOLECYSTECTOMY N/A 04/30/2020   Procedure: LAPAROSCOPIC CHOLECYSTECTOMY WITH INTRAOPERATIVE CHOLANGIOGRAM;  Surgeon: Armandina Gemma, MD;  Location: [facility];  Service: General;  Laterality: N/A;  . CORONARY ARTERY BYPASS GRAFT N/A 11/23/2017   Procedure: CORONARY ARTERY BYPASS GRAFTING (CABG) x 2, ON PUMP, USING LEFT INTERNAL MAMMARY ARTERY AND RIGHT GREATER SAPHENOUS VEIN HARVESTED ENDOSCOPICALLY;  Surgeon: Gaye Pollack, MD;  Location: [place];  Service: Open Heart Surgery;  Laterality: N/A;  . CRYOABLATION     cervical  . ESOPHAGOGASTRODUODENOSCOPY (EGD) WITH PROPOFOL N/A 12/02/2019   Procedure: ESOPHAGOGASTRODUODENOSCOPY (EGD) WITH PROPOFOL;  Surgeon: Rush Landmark Telford Nab., MD;  Location: [place];  Service: Gastroenterology;  Laterality: N/A;  . ESOPHAGOGASTRODUODENOSCOPY (EGD) WITH PROPOFOL N/A 03/19/2020   Procedure: ESOPHAGOGASTRODUODENOSCOPY (EGD) WITH PROPOFOL;  Surgeon: Milus Banister, MD;  [facility];  Service: Endoscopy;  Laterality: N/A;  . EUS N/A 03/19/2020   Procedure: UPPER ENDOSCOPIC ULTRASOUND (EUS) RADIAL;  Surgeon: Milus Banister, MD;  [facility];  Service: Endoscopy;  Laterality: N/A;  . EUS N/A 03/19/2020   Procedure: UPPER ENDOSCOPIC ULTRASOUND (EUS) LINEAR;  Surgeon: Milus Banister, MD;  [facility];  Service: Endoscopy;  Laterality: N/A;  . FINE NEEDLE ASPIRATION  12/02/2019   Procedure: FINE NEEDLE ASPIRATION (FNA) LINEAR;  Surgeon: Irving Copas., MD;  Location: [place];  Service: Gastroenterology;;  . FINE NEEDLE ASPIRATION N/A 03/19/2020   Procedure: FINE NEEDLE ASPIRATION (FNA) LINEAR;  Surgeon: Milus Banister, MD;  [facility];  Service: Endoscopy;  Laterality: N/A;  . IR ANGIO INTRA EXTRACRAN SEL COM CAROTID INNOMINATE UNI R MOD SED  08/21/2017  . IR ANGIO VERTEBRAL SEL SUBCLAVIAN INNOMINATE UNI R MOD SED  08/21/2017  . IR PERCUTANEOUS ART THROMBECTOMY/INFUSION INTRACRANIAL INC DIAG ANGIO  08/21/2017  . IR RADIOLOGIST EVAL & MGMT  10/30/2017  . LOOP RECORDER INSERTION N/A 08/28/2017   Procedure: LOOP RECORDER INSERTION;  Surgeon: Constance Haw, MD;  Location: [place] [facility];  Service: Cardiovascular;  Laterality: N/A;  . RADIOLOGY WITH ANESTHESIA N/A 08/21/2017   Procedure: RADIOLOGY WITH ANESTHESIA;  Surgeon: Luanne Bras, MD;  Location: [place];  Service: Radiology;  Laterality: N/A;  . RIGHT/LEFT HEART CATH AND CORONARY ANGIOGRAPHY N/A 11/20/2017   Procedure: RIGHT/LEFT HEART CATH AND CORONARY ANGIOGRAPHY;  Surgeon: Jettie Booze, MD;  Location: [place] [facility];  Service: Cardiovascular;  Laterality: N/A;  . TEE WITHOUT CARDIOVERSION N/A 08/28/2017   Procedure: TRANSESOPHAGEAL ECHOCARDIOGRAM (TEE);  Surgeon: Fay Records, MD;  Location: [place];  Service: Cardiovascular;  Laterality: N/A;  . TEE WITHOUT CARDIOVERSION N/A 11/23/2017   Procedure: TRANSESOPHAGEAL ECHOCARDIOGRAM (TEE);  Surgeon: Gaye Pollack, MD;  Location: [place];  Service: Open Heart Surgery;  Laterality: N/A;  . TUBAL LIGATION    . UPPER ESOPHAGEAL ENDOSCOPIC ULTRASOUND (EUS) N/A 12/02/2019   Procedure: UPPER ESOPHAGEAL ENDOSCOPIC ULTRASOUND (EUS);  Surgeon: Irving Copas., MD;  Location: [place];  Service:  Gastroenterology;  Laterality: N/A;   Family History  Problem Relation Age of Onset  . Heart attack Father   . Hypertension Mother   . Hyperlipidemia Mother   . Diabetes type II Mother   . Urticaria Mother   . Hypertension Brother   . Breast cancer Cousin 3  . Immunodeficiency Neg Hx   . Eczema Neg Hx   . Atopy Neg Hx   . Asthma Neg Hx   . Angioedema Neg Hx   . Allergic rhinitis Neg Hx   . Colon cancer Neg Hx   . Esophageal cancer Neg Hx   . Stomach cancer Neg Hx   . Pancreatic cancer Neg Hx   . Colon polyps Neg Hx   . Esophageal varices Neg Hx   . Inflammatory bowel disease Neg Hx   . Rectal cancer Neg Hx    Allergies as of 06/16/2020      Reactions   Dilaudid [hydromorphone] Other (See Comments)   Hallucinations   Entresto [sacubitril-valsartan] Swelling, Rash   Welts, possible angioedema   Lopressor [metoprolol Tartrate] Swelling   Angioedema   Allegra Allergy [fexofenadine Hcl] Hives   Nsaids    On Xarelto   Gabapentin (once-daily) Rash   Lipitor [atorvastatin] Diarrhea      Medication List       Accurate as of June 16, 2020 11:59 PM. If you have any questions, ask your nurse or doctor.        STOP taking these medications   oxyCODONE 5 MG immediate release tablet Commonly known as: Oxy IR/ROXICODONE Stopped by: Howard Pouch, DO     TAKE these medications   acetaminophen 325 MG tablet Commonly known as: TYLENOL Take 2 tablets (650 mg total) by mouth every 6 (six) hours as needed for mild pain or moderate pain (or Fever >/= 101).   azelastine 0.1 % nasal spray Commonly known as: ASTELIN Place 1-2 sprays into both nostrils 2 (two) times daily as needed for rhinitis. Use in each nostril as directed What changed: when to take this   baclofen 10 MG tablet Commonly known as: LIORESAL TAKE 1 TABLET BY MOUTH THREE TIMES DAILY . APPOINTMENT REQUIRED FOR FUTURE REFILLS   calcium carbonate 500 MG chewable tablet Commonly known as: TUMS - dosed in mg  elemental calcium Chew 1 tablet by mouth 2 (two) times daily as needed for indigestion or heartburn.   carvedilol 3.125 MG tablet Commonly known as: COREG Take 1 tablet (3.125 mg total) by mouth in the morning and at bedtime.  cefdinir 300 MG capsule Commonly known as: OMNICEF Take 1 capsule (300 mg total) by mouth 2 (two) times daily. Started by: Howard Pouch, DO   cholecalciferol 1000 units tablet Commonly known as: VITAMIN D Take 1,000 Units by mouth daily.   diclofenac Sodium 1 % Gel Commonly known as: VOLTAREN APPLY 2 GRAMS TOPICALLY 4 TIMES A DAY   ferrous sulfate 325 (65 FE) MG tablet Take 325 mg by mouth 2 (two) times a week. Mondays & Thursdays.   hydrOXYzine 10 MG tablet Commonly known as: ATARAX/VISTARIL Take 1 tablet (10 mg total) by mouth at bedtime.   levocetirizine 5 MG tablet Commonly known as: XYZAL Take 1 tablet (5 mg total) by mouth every evening.   ondansetron 4 MG disintegrating tablet Commonly known as: Zofran ODT Take 1 tablet (4 mg total) by mouth every 8 (eight) hours as needed for nausea or vomiting.   pantoprazole 40 MG tablet Commonly known as: PROTONIX Take 1 tablet (40 mg total) by mouth daily.   polyethylene glycol 17 g packet Commonly known as: MIRALAX / GLYCOLAX Take 17 g by mouth 2 (two) times daily. Started by: Howard Pouch, DO   pravastatin 20 MG tablet Commonly known as: PRAVACHOL Take 1 tablet (20 mg total) by mouth every evening.   rivaroxaban 20 MG Tabs tablet Commonly known as: Xarelto Take 1 tablet daily with evening meal   senna-docusate 8.6-50 MG tablet Commonly known as: Senokot-S 2 tabs today, then 1 tab QD Started by: Howard Pouch, DO   traMADol 50 MG tablet Commonly known as: ULTRAM TAKE 1 TO 2 TABLETS BY MOUTH EVERY 8 HOURS AS NEEDED FOR MODERATE PAIN What changed: See the new instructions.   vitamin B-12 100 MCG tablet Commonly known as: CYANOCOBALAMIN Take 100 mcg by mouth daily.       All past  medical history, surgical history, allergies, family history, immunizations andmedications were updated in the EMR today and reviewed under the history and medication portions of their EMR.     ROS: Negative, with the exception of above mentioned in HPI   Objective:  BP 106/71   Pulse (!) 106   Temp 98.4 F (36.9 C) (Oral)   Ht _0  (1.549 m)   Wt 107 lb (48.5 kg)   LMP  (LMP Unknown) Comment: at age 25  SpO2 94%   BMI 20.22 kg/m  Body mass index is 20.22 kg/m. Gen: Afebrile. No acute distress. Nontoxic in appearance, well developed, well nourished.  HENT: AT. [place].  No cough or shortness of breath on exam.  No hoarseness. Eyes:Pupils Equal Round Reactive to light, Extraocular movements intact,  Conjunctiva without redness, discharge or icterus. Neck/lymp/endocrine: Supple, no lymphadenopathy CV: Tachycardia present, regular rhythm, no edema  Chest: CTAB, no wheeze or crackles. Good air movement, normal resp effort.  Abd: Soft.  Flat. ND.  Tender to palpation midline epigastric region.  BS hyperactive.  No masses palpated. No rebound or guarding.  MSK: Mild CVA tenderness left Skin: No rashes, purpura or petechiae.  Neuro: Normal gait. PERLA. EOMi. Alert. Oriented x3  Psych: Normal affect, dress and demeanor. Normal speech. Normal thought content and judgment.  No exam data present CT ABDOMEN PELVIS W CONTRAST  Result Date: 06/17/2020 CLINICAL DATA:  Left upper quadrant pain. EXAM: CT ABDOMEN AND PELVIS WITH CONTRAST TECHNIQUE: Multidetector CT imaging of the abdomen and pelvis was performed using the standard protocol following bolus administration of intravenous contrast. CONTRAST:  158m OMNIPAQUE IOHEXOL 300 MG/ML  SOLN COMPARISON:  March 17, 2020 FINDINGS: Lower chest: Mild bibasilar atelectasis. Hepatobiliary: No focal liver abnormality is seen. Status post cholecystectomy. No biliary dilatation. Pancreas: A 1.9 cm x 1.8 cm well-defined area of low attenuation is seen within the  head of the pancreas. This is decreased in size when compared to the prior study (measured 3.1 cm x 2.6 cm on the prior study). Mild peripancreatic inflammatory fat stranding is seen. Spleen: No splenic injury or perisplenic hematoma. Adrenals/Urinary Tract: The right adrenal gland is normal in size and appearance. A 1.1 cm x 0.8 cm low-attenuation left adrenal mass is seen. Kidneys are normal in size, without renal calculi or hydronephrosis. Multiple subcentimeter cysts are seen within the left kidney. Bladder is unremarkable. Stomach/Bowel: Stomach is within normal limits. Appendix appears normal. No evidence of bowel wall thickening, distention, or inflammatory changes. Noninflamed diverticula are seen within the descending and sigmoid colon. A 2.4 cm x 2.2 cm area of focal asymmetric lateral rectal wall thickening is noted on the right (axial CT image 81, CT series number 2). Asymmetric rectal wall thickening is seen within this region on the prior study (axial CT image 81, CT series number 2). Vascular/Lymphatic: There is marked severity calcification and atherosclerosis of the abdominal aorta and bilateral common iliac arteries. Stable 3.1 cm x 3.0 cm aneurysmal dilatation of the infrarenal abdominal aorta is noted. No enlarged abdominal or pelvic lymph nodes. Reproductive: Multiple tortuous vessels are seen along the lateral aspects of a normal appearing uterus. The bilateral adnexa are unremarkable. Other: No abdominal wall hernia or abnormality. No abdominopelvic ascites. Musculoskeletal: No acute or significant osseous findings. IMPRESSION: 1. Mild peripancreatic inflammatory fat stranding which may represent sequelae associated with acute pancreatitis. Correlation with pancreatic enzymes is recommended. 2. Stable 3.1 cm x 3.0 cm infrarenal abdominal aortic aneurysm. 3. Asymmetric rectal wall thickening, as described above. The presence of a soft tissue mass cannot be excluded correlation with physical  examination is recommended. 4. Colonic diverticulosis. 5. Stable low-attenuation left adrenal mass which may represent an adrenal adenoma. 6. Aortic atherosclerosis. Aortic Atherosclerosis (ICD10-I70.0). Electronically Signed   By: Virgina Norfolk M.D.   On: 06/17/2020 16:11   No results found for this or any previous visit (from the past 24 hour(s)).  Assessment/Plan: [name] is a 59 y.o. female present for OV for  Need for influenza vaccination - Flu Vaccine QUAD 6+ mos PF IM (Fluarix Quad PF)  Acute bilateral thoracic back pain/hematuria/abdominal pain/chronic pancreatitis with pancreatic cyst Uncertain etiology of discomfort.  Discussed with patient and her husband today differential diagnosis of recurrent pancreatitis, ileus, kidney stone versus UTI.  She is dehydrated on exam with ketones in her urine. Strongly encouraged her to increase hydration, water and Gatorade Clear liquid diet encouraged, advance to bland as tolerated after 24 hours. Start MiraLAX twice daily and senna  Rocephin IM provided today, start Omnicef tonight twice daily Abdominal x-ray ordered, urine will send for culture and collect CBC, CMP, CRP and lipase today. - POCT Urinalysis Dipstick - Urinalysis w microscopic + reflex cultur - DG Abd 2 Views; Future - cefTRIAXone (ROCEPHIN) injection 1 g - CBC w/Diff - Comp Met (CMET) - Lipase - DG Abd 2 Views; Future - C-reactive protein Patient will be called with results and guided on further instructions.   Reviewed expectations re: course of current medical issues.  Discussed self-management of symptoms.  Outlined signs and symptoms indicating need for more acute intervention.  Patient verbalized understanding and all questions were answered.  Patient received an After-Visit Summary.    Orders Placed This Encounter  Procedures  . Urine Culture  . DG Abd 2 Views  . Flu Vaccine QUAD 6+ mos PF IM (Fluarix Quad PF)  . Urinalysis w microscopic  + reflex cultur  . CBC w/Diff  . Comp Met (CMET)  . Lipase  . C-reactive protein  . REFLEXIVE URINE CULTURE  . POCT Urinalysis Dipstick   Meds ordered this encounter  Medications  . cefdinir (OMNICEF) 300 MG capsule    Sig: Take 1 capsule (300 mg total) by mouth 2 (two) times daily.    Dispense:  20 capsule    Refill:  0  . polyethylene glycol (MIRALAX / GLYCOLAX) 17 g packet    Sig: Take 17 g by mouth 2 (two) times daily.    Dispense:  14 each    Refill:  0  . senna-docusate (SENOKOT-S) 8.6-50 MG tablet    Sig: 2 tabs today, then 1 tab QD    Dispense:  30 tablet    Refill:  0  . cefTRIAXone (ROCEPHIN) injection 1 g   Referral Orders  No referral(s) requested today   > 40 minutes was dedicated to this patient's encounter to include pre-visit review of chart, face-to-face time with patient and post-visit work- which include documentation and prescribing medications and/or ordering test when necessary.     Note is dictated utilizing voice recognition software. Although note has been proof read prior to signing, occasional typographical errors still can be missed. If any questions arise, please do not hesitate to call for verification.   electronically signed by:  Howard Pouch, DO  [place]

## 2020-06-16 NOTE — Patient Instructions (Addendum)
Please have xray completed at [facility] today.   Start miralax 1 cap or packet every 12 hours until stool burden relieved. Then once daily.   Start senna 2 tabs today. Then 1 tab daily> this helps stimulate colon to have a bowel movement.   Start omnicef antibiotic tonight before bed> this is every 12 hours.  Hydrate- water, gatorade etc. Clear liquid diet for 24 hours and then advanced as tolerated to bland, then regular.   Hopefully symptoms are from constipation or bladder infection and can be treated easily.   Clear Liquid Diet, Adult A clear liquid diet is a diet that includes only liquids and semi-liquids that you can see through. You do not eat any food on this diet. Most people need to follow this diet for only a short time. You may need to follow a clear liquid diet if:  You have a problem right before or after you have surgery.  You did not eat food for a long time.  You had any of these: ? Feeling sick to your stomach (nausea). ? Throwing up (vomiting). ? Passing a watery stool (diarrhea).  You are going to have an exam to look at parts of your digestive system.  You are going to have bowel surgery. The goals of this diet are:  To rest the stomach.  To help you clear the digestive system before an exam.  To make sure that there is enough fluid in your body.  To make sure you get some energy.  To help you get back to eating like you used to. What are tips for following this plan?  A clear liquid is a liquid or semi-liquid that you can see through when you hold it up to a light. An example of this is gelatin.  This diet does not give you all the nutrients that you need. Choose a variety of the liquids that your doctor says you can drink on this diet. That way, you will get as many nutrients as possible.  If you are not sure whether you can have certain items, ask your doctor. If you are unable to swallow a thin liquid, you will need to thicken it  before taking it. This will stop you from breathing it in (aspiration). What foods should I eat?   Water and flavored water.  Fruit juices that do not have pulp, such as cranberry juice and apple juice.  Tea and coffee without milk or cream.  Clear bouillon or broth.  Broth-based soups that have been strained.  Flavored gelatins.  Honey.  Sugar water.  Ice or frozen ice pops that do not have any milk, yogurt, fruit pieces, or fruit pulp in them.  Clear sodas.  Clear sports drinks. The items listed above may not be a complete list of what you can eat and drink. Contact a dietitian for more options. What foods should I avoid?  Juices that have pulp.  Milk.  Cream or cream-based soups.  Yogurt.  Normal foods that are not clear liquids or semi-liquids. The items listed above may not be a complete list of what you should not eat and drink. Contact a dietitian for options. Questions to ask your health care provider  How long do I need to follow this diet?  Are there any medicines that I should change while on this diet? Summary  A clear liquid diet is a diet that includes only liquids and semi-liquids that you can see through.  Some goals of  this diet are to rest your stomach, make sure you get enough fluid, and give you some energy.  Avoid liquids with milk, cream, or pulp while you are on this diet. This information is not intended to replace advice given to you by your health care provider. Make sure you discuss any questions you have with your health care provider. Document Revised: 03/05/2018 Document Reviewed: 03/05/2018 Elsevier Patient Education  [place].

## 2020-06-17 ENCOUNTER — Encounter (HOSPITAL_COMMUNITY): Payer: Self-pay | Admitting: Emergency Medicine

## 2020-06-17 ENCOUNTER — Inpatient Hospital Stay (HOSPITAL_COMMUNITY)
Admission: EM | Admit: 2020-06-17 | Discharge: 2020-06-20 | DRG: 439 | Disposition: A | Discharge status: Home / Self Care | Payer: 59 | Attending: Internal Medicine | Admitting: Internal Medicine

## 2020-06-17 ENCOUNTER — Emergency Department (HOSPITAL_COMMUNITY): Payer: 59

## 2020-06-17 ENCOUNTER — Telehealth: Payer: Self-pay | Admitting: Family Medicine

## 2020-06-17 DIAGNOSIS — Z9049 Acquired absence of other specified parts of digestive tract: Secondary | ICD-10-CM

## 2020-06-17 DIAGNOSIS — Z888 Allergy status to other drugs, medicaments and biological substances status: Secondary | ICD-10-CM | POA: Diagnosis not present

## 2020-06-17 DIAGNOSIS — Z20822 Contact with and (suspected) exposure to covid-19: Secondary | ICD-10-CM | POA: Diagnosis present

## 2020-06-17 DIAGNOSIS — Z7901 Long term (current) use of anticoagulants: Secondary | ICD-10-CM

## 2020-06-17 DIAGNOSIS — F17210 Nicotine dependence, cigarettes, uncomplicated: Secondary | ICD-10-CM | POA: Diagnosis present

## 2020-06-17 DIAGNOSIS — Z885 Allergy status to narcotic agent status: Secondary | ICD-10-CM | POA: Diagnosis not present

## 2020-06-17 DIAGNOSIS — Z833 Family history of diabetes mellitus: Secondary | ICD-10-CM

## 2020-06-17 DIAGNOSIS — K861 Other chronic pancreatitis: Secondary | ICD-10-CM | POA: Diagnosis present

## 2020-06-17 DIAGNOSIS — E871 Hypo-osmolality and hyponatremia: Secondary | ICD-10-CM | POA: Diagnosis not present

## 2020-06-17 DIAGNOSIS — Z951 Presence of aortocoronary bypass graft: Secondary | ICD-10-CM | POA: Diagnosis not present

## 2020-06-17 DIAGNOSIS — K219 Gastro-esophageal reflux disease without esophagitis: Secondary | ICD-10-CM | POA: Diagnosis present

## 2020-06-17 DIAGNOSIS — I480 Paroxysmal atrial fibrillation: Secondary | ICD-10-CM | POA: Diagnosis present

## 2020-06-17 DIAGNOSIS — I252 Old myocardial infarction: Secondary | ICD-10-CM | POA: Diagnosis not present

## 2020-06-17 DIAGNOSIS — Z803 Family history of malignant neoplasm of breast: Secondary | ICD-10-CM

## 2020-06-17 DIAGNOSIS — I2510 Atherosclerotic heart disease of native coronary artery without angina pectoris: Secondary | ICD-10-CM | POA: Diagnosis present

## 2020-06-17 DIAGNOSIS — Z83438 Family history of other disorder of lipoprotein metabolism and other lipidemia: Secondary | ICD-10-CM

## 2020-06-17 DIAGNOSIS — K5900 Constipation, unspecified: Secondary | ICD-10-CM | POA: Diagnosis present

## 2020-06-17 DIAGNOSIS — E860 Dehydration: Secondary | ICD-10-CM | POA: Diagnosis present

## 2020-06-17 DIAGNOSIS — Z8249 Family history of ischemic heart disease and other diseases of the circulatory system: Secondary | ICD-10-CM | POA: Diagnosis not present

## 2020-06-17 DIAGNOSIS — Z79899 Other long term (current) drug therapy: Secondary | ICD-10-CM

## 2020-06-17 DIAGNOSIS — I5032 Chronic diastolic (congestive) heart failure: Secondary | ICD-10-CM | POA: Diagnosis present

## 2020-06-17 DIAGNOSIS — I714 Abdominal aortic aneurysm, without rupture: Secondary | ICD-10-CM | POA: Diagnosis present

## 2020-06-17 DIAGNOSIS — K859 Acute pancreatitis without necrosis or infection, unspecified: Principal | ICD-10-CM | POA: Diagnosis present

## 2020-06-17 DIAGNOSIS — Z8673 Personal history of transient ischemic attack (TIA), and cerebral infarction without residual deficits: Secondary | ICD-10-CM

## 2020-06-17 DIAGNOSIS — K8500 Idiopathic acute pancreatitis without necrosis or infection: Secondary | ICD-10-CM | POA: Diagnosis not present

## 2020-06-17 DIAGNOSIS — E785 Hyperlipidemia, unspecified: Secondary | ICD-10-CM | POA: Diagnosis present

## 2020-06-17 DIAGNOSIS — K760 Fatty (change of) liver, not elsewhere classified: Secondary | ICD-10-CM | POA: Diagnosis present

## 2020-06-17 DIAGNOSIS — I255 Ischemic cardiomyopathy: Secondary | ICD-10-CM | POA: Diagnosis present

## 2020-06-17 DIAGNOSIS — K862 Cyst of pancreas: Secondary | ICD-10-CM | POA: Diagnosis present

## 2020-06-17 DIAGNOSIS — N390 Urinary tract infection, site not specified: Secondary | ICD-10-CM | POA: Diagnosis present

## 2020-06-17 DIAGNOSIS — Z87442 Personal history of urinary calculi: Secondary | ICD-10-CM

## 2020-06-17 DIAGNOSIS — I6523 Occlusion and stenosis of bilateral carotid arteries: Secondary | ICD-10-CM | POA: Diagnosis present

## 2020-06-17 LAB — COMPREHENSIVE METABOLIC PANEL
AG Ratio: 1.5 (calc) (ref 1.0–2.5)
ALT: 10 U/L (ref 6–29)
ALT: 11 U/L (ref 0–44)
AST: 20 U/L (ref 10–35)
AST: 20 U/L (ref 15–41)
Albumin: 4.1 g/dL (ref 3.6–5.1)
Albumin: 3.4 g/dL — ABNORMAL LOW (ref 3.5–5.0)
Alkaline Phosphatase: 58 U/L (ref 38–126)
Alkaline phosphatase (APISO): 81 U/L (ref 37–153)
Anion gap: 10 (ref 5–15)
BUN: 18 mg/dL (ref 7–25)
BUN: 18 mg/dL (ref 6–20)
CO2: 26 mmol/L (ref 20–32)
CO2: 25 mmol/L (ref 22–32)
Calcium: 9.8 mg/dL (ref 8.6–10.4)
Calcium: 8.9 mg/dL (ref 8.9–10.3)
Chloride: 97 mmol/L — ABNORMAL LOW (ref 98–110)
Chloride: 96 mmol/L — ABNORMAL LOW (ref 98–111)
Creat: 0.67 mg/dL (ref 0.50–0.99)
Creatinine, Ser: 0.71 mg/dL (ref 0.44–1.00)
GFR calc Af Amer: >60 mL/min (ref >60)
GFR calc non Af Amer: >60 mL/min (ref >60)
Globulin: 2.8 g/dL (calc) (ref 1.9–3.7)
Glucose, Bld: 60 mg/dL — ABNORMAL LOW (ref 65–99)
Glucose, Bld: 78 mg/dL (ref 70–99)
Potassium: 4.5 mmol/L (ref 3.5–5.3)
Potassium: 4.1 mmol/L (ref 3.5–5.1)
Sodium: 134 mmol/L — ABNORMAL LOW (ref 135–146)
Sodium: 131 mmol/L — ABNORMAL LOW (ref 135–145)
Total Bilirubin: 0.6 mg/dL (ref 0.2–1.2)
Total Bilirubin: 0.6 mg/dL (ref 0.3–1.2)
Total Protein: 6.9 g/dL (ref 6.1–8.1)
Total Protein: 6.8 g/dL (ref 6.5–8.1)

## 2020-06-17 LAB — LIPASE, BLOOD: Lipase: 157 U/L — ABNORMAL HIGH (ref 11–51)

## 2020-06-17 LAB — CBC
HCT: 40 % (ref 36.0–46.0)
Hemoglobin: 13.5 g/dL (ref 12.0–15.0)
MCH: 32.1 pg (ref 26.0–34.0)
MCHC: 33.8 g/dL (ref 30.0–36.0)
MCV: 95 fL (ref 80.0–100.0)
Platelets: 287 10*3/uL (ref 150–400)
RBC: 4.21 MIL/uL (ref 3.87–5.11)
RDW: 13.4 % (ref 11.5–15.5)
WBC: 11.6 10*3/uL — ABNORMAL HIGH (ref 4.0–10.5)
nRBC: 0 % (ref 0.0–0.2)

## 2020-06-17 LAB — CBC WITH DIFFERENTIAL/PLATELET
Absolute Monocytes: 1576 cells/uL — ABNORMAL HIGH (ref 200–950)
Basophils Absolute: 28 cells/uL (ref 0–200)
Basophils Relative: 0.2 %
Eosinophils Absolute: 57 cells/uL (ref 15–500)
Eosinophils Relative: 0.4 %
HCT: 46.1 % — ABNORMAL HIGH (ref 35.0–45.0)
Hemoglobin: 15.7 g/dL — ABNORMAL HIGH (ref 11.7–15.5)
Lymphs Abs: 1803 cells/uL (ref 850–3900)
MCH: 31.9 pg (ref 27.0–33.0)
MCHC: 34.1 g/dL (ref 32.0–36.0)
MCV: 93.7 fL (ref 80.0–100.0)
MPV: 9.9 fL (ref 7.5–12.5)
Monocytes Relative: 11.1 %
Neutro Abs: 10735 cells/uL — ABNORMAL HIGH (ref 1500–7800)
Neutrophils Relative %: 75.6 %
Platelets: 381 10*3/uL (ref 140–400)
RBC: 4.92 10*6/uL (ref 3.80–5.10)
RDW: 12.7 % (ref 11.0–15.0)
Total Lymphocyte: 12.7 %
WBC: 14.2 10*3/uL — ABNORMAL HIGH (ref 3.8–10.8)

## 2020-06-17 LAB — SARS CORONAVIRUS 2 BY RT PCR (HOSPITAL ORDER, PERFORMED IN ~~LOC~~ HOSPITAL LAB): SARS Coronavirus 2: NEGATIVE

## 2020-06-17 LAB — C-REACTIVE PROTEIN: CRP: 177.3 mg/L — ABNORMAL HIGH (ref <8.0)

## 2020-06-17 LAB — LIPASE: Lipase: 2840 U/L — ABNORMAL HIGH (ref 7–60)

## 2020-06-17 MED ORDER — PANTOPRAZOLE SODIUM 40 MG PO TBEC
40.0000 mg | DELAYED_RELEASE_TABLET | Freq: Every day | ORAL | Status: DC
  Administered 2020-06-18 – 2020-06-20 (×3): 40 mg via ORAL
  Filled 2020-06-17 (×3): qty 1

## 2020-06-17 MED ORDER — MORPHINE SULFATE (PF) 2 MG/ML IV SOLN
2.0000 mg | INTRAVENOUS | Status: DC | PRN
  Administered 2020-06-17 – 2020-06-18 (×3): 2 mg via INTRAVENOUS
  Filled 2020-06-17 (×3): qty 1

## 2020-06-17 MED ORDER — RIVAROXABAN 10 MG PO TABS: 20.0000 mg | ORAL_TABLET | Freq: Every day | ORAL | Status: DC

## 2020-06-17 MED ORDER — ONDANSETRON HCL 4 MG/2ML IJ SOLN: 4.0000 mg | Freq: Four times a day (QID) | INTRAMUSCULAR | Status: DC | PRN

## 2020-06-17 MED ORDER — IOHEXOL 300 MG/ML  SOLN
100.0000 mL | Freq: Once | INTRAMUSCULAR | Status: AC | PRN
  Administered 2020-06-17: 100 mL via INTRAVENOUS

## 2020-06-17 MED ORDER — RIVAROXABAN 10 MG PO TABS
20.0000 mg | ORAL_TABLET | Freq: Every day | ORAL | Status: DC
  Administered 2020-06-17 – 2020-06-19 (×3): 20 mg via ORAL
  Filled 2020-06-17 (×3): qty 2

## 2020-06-17 MED ORDER — AZELASTINE HCL 0.1 % NA SOLN
1.0000 | Freq: Every day | NASAL | Status: DC | PRN
  Filled 2020-06-17: qty 30

## 2020-06-17 MED ORDER — VITAMIN D 25 MCG (1000 UNIT) PO TABS
1000.0000 [IU] | ORAL_TABLET | Freq: Every day | ORAL | Status: DC
  Administered 2020-06-18 – 2020-06-20 (×3): 1000 [IU] via ORAL
  Filled 2020-06-17 (×3): qty 1

## 2020-06-17 MED ORDER — SENNOSIDES-DOCUSATE SODIUM 8.6-50 MG PO TABS
1.0000 | ORAL_TABLET | Freq: Every day | ORAL | Status: DC
  Administered 2020-06-18 – 2020-06-20 (×2): 1 via ORAL
  Filled 2020-06-17 (×2): qty 1

## 2020-06-17 MED ORDER — HYDROXYZINE HCL 10 MG PO TABS
10.0000 mg | ORAL_TABLET | Freq: Every day | ORAL | Status: DC
  Administered 2020-06-17 – 2020-06-19 (×3): 10 mg via ORAL
  Filled 2020-06-17 (×4): qty 1

## 2020-06-17 MED ORDER — BACLOFEN 10 MG PO TABS
10.0000 mg | ORAL_TABLET | Freq: Two times a day (BID) | ORAL | Status: DC
  Administered 2020-06-17 – 2020-06-20 (×6): 10 mg via ORAL
  Filled 2020-06-17 (×6): qty 1

## 2020-06-17 MED ORDER — CETIRIZINE HCL 10 MG PO TABS
10.0000 mg | ORAL_TABLET | Freq: Every evening | ORAL | Status: DC
  Administered 2020-06-17 – 2020-06-19 (×3): 10 mg via ORAL
  Filled 2020-06-17 (×5): qty 1

## 2020-06-17 MED ORDER — MORPHINE SULFATE (PF) 2 MG/ML IV SOLN
2.0000 mg | Freq: Once | INTRAVENOUS | Status: AC
  Administered 2020-06-17: 2 mg via INTRAVENOUS
  Filled 2020-06-17: qty 1

## 2020-06-17 MED ORDER — CARVEDILOL 3.125 MG PO TABS
3.1250 mg | ORAL_TABLET | Freq: Two times a day (BID) | ORAL | Status: DC
  Administered 2020-06-18 – 2020-06-20 (×5): 3.125 mg via ORAL
  Filled 2020-06-17 (×5): qty 1

## 2020-06-17 MED ORDER — VITAMIN B-12 100 MCG PO TABS
100.0000 ug | ORAL_TABLET | Freq: Every day | ORAL | Status: DC
  Administered 2020-06-18 – 2020-06-20 (×3): 100 ug via ORAL
  Filled 2020-06-17 (×3): qty 1

## 2020-06-17 MED ORDER — LACTATED RINGERS IV BOLUS
1000.0000 mL | Freq: Once | INTRAVENOUS | Status: AC
  Administered 2020-06-17: 1000 mL via INTRAVENOUS

## 2020-06-17 MED ORDER — ONDANSETRON HCL 4 MG PO TABS: 4.0000 mg | ORAL_TABLET | Freq: Four times a day (QID) | ORAL | Status: DC | PRN

## 2020-06-17 MED ORDER — SODIUM CHLORIDE 0.9 % IV SOLN: INTRAVENOUS | Status: DC

## 2020-06-17 MED ORDER — LACTATED RINGERS IV SOLN: INTRAVENOUS | Status: AC

## 2020-06-17 MED ORDER — PRAVASTATIN SODIUM 20 MG PO TABS
20.0000 mg | ORAL_TABLET | Freq: Every evening | ORAL | Status: DC
  Administered 2020-06-17 – 2020-06-19 (×3): 20 mg via ORAL
  Filled 2020-06-17 (×3): qty 1

## 2020-06-17 MED ORDER — CEFDINIR 300 MG PO CAPS
300.0000 mg | ORAL_CAPSULE | Freq: Two times a day (BID) | ORAL | Status: DC
  Administered 2020-06-17 – 2020-06-20 (×6): 300 mg via ORAL
  Filled 2020-06-17 (×7): qty 1

## 2020-06-17 NOTE — Telephone Encounter (Signed)
Please call patient  Her inflammatory marker called CRP is extremely high at 177 (< 0.8 nl). Has an elevated white blood cell count-which is indicative of infection or inflammatory process Electrolytes and glucose is low secondary to not eating and drinking well. Lastly her lipase-pancreatic enzyme is greater than 2800.  Unfortunately, this means she will need to report to the emergency room for evaluation and treatment of recurrent pancreatitis.  Her abdominal x-ray is not yet read by radiology, I have looked at the images and I have concerns she may have an ileus/blockage of her colon as well.

## 2020-06-17 NOTE — ED Triage Notes (Signed)
Per pt, states she saw PCP yesterday-had blood work and xrays done-states LUQ pain-history of pancreatitis-states lipase is elevated-had gallbladder removed in August to resolve issue but it has not-states she might have SBO as well

## 2020-06-17 NOTE — ED Provider Notes (Addendum)
[facility] DEPT Provider Note   CSN: [id number] Arrival date & time: 06/17/20  1031  History Chief Complaint  Patient presents with  . Abdominal Pain   [name] is a 59 y.o. female.  62 year old patient with repeat acute pancreatitis episode s/p cholecystectomy.  Patient began having symptoms Saturday (4 days ago) including epigastric, LUQ pain radiating to the back, nausea and decreased appetite, constipation. She was able to tolerate dinner on Saturday but has not had any further solid foods. She can tolerate her medications and fluids with zofran for nausea. No vomiting or diarrhea. This feels like her past pancreatitis episodes.  Her PCP started her on omnicef yesterday for UTI and stool softener for constipation. Patient follows with [facility] and has had extensive workup of etiology of recurrent pancreatitis.  I spoke with Dr. Bryan Lemma about her disposition today as his last office note indicated that she would be appropriate for tertiary transfer if had reoccurrence. He agreed that we should admit her and transfer to [facility] or [facility] for further evaluation.    Past Medical History:  Diagnosis Date  . AAA (abdominal aortic aneurysm) ([place])    a. 3.3cm infrarenal by CT 02/2020.  . Angio-edema   . Arthritis   . CAD (coronary artery disease)    a. s/p NSTEMI in 10/2017 with cath showing LM disease --> s/p CABG on 11/23/2017 with LIMA-LAD and SVG-OM.   [name] Carotid stenosis    a. Duplex 11/2018 6-33% RICA, 35-45% LICA.  [name] Gastritis and duodenitis 12/06/2019   11/2019 Peptic duodenitis  - No dysplasia or malignancy identified  B. STOMACH, BIOPSY:  - Mild chronic gastritis with intestinal metaplasia   . Hay fever   . History of kidney stones   . Ischemic cardiomyopathy    a. EF 35-40% by echo in 10/2017. b. 55% by echo 01/2020.  . NSTEMI (non-ST elevated myocardial infarction) ([place])   . PAF (paroxysmal atrial fibrillation) ([place])    Diagnosed by ILR    . Pancreatitis   . Pilonidal cyst 10/21/2019  . Pneumonia 11/16/2017  . Stroke [facility]) 08/23/2017   Left MCA infarct status post TPA and thrombectomy  . Urticaria    Patient Active Problem List   Diagnosis Date Noted  . Abnormality of gait 05/05/2020  . High serum carbohydrate antigen 19-9 (CA19-9) 04/09/2020  . Closed nondisplaced fracture of greater trochanter of right femur ([place]) 04/09/2020  . Lytic bone lesion of femur   . Pure hypercholesterolemia   . Cystocele, unspecified  03/03/2020  . Kidney stone 02/18/2020  . Idiopathic recurrent acute pancreatitis 01/25/2020  . Gallbladder sludge and cholelithiasis without cholecystitis 01/25/2020  . Abnormal findings on endoscopic ultrasound (EUS) 01/25/2020  . Abnormality present on pathology ([facility] Pathology) 01/25/2020  . Severe protein-calorie malnutrition ([place])   . Pancreatitis, recurrent 11/07/2019  . Hashimoto's thyroiditis 10/22/2019  . Pancreatic cyst 09/11/2019  . AAA (abdominal aortic aneurysm) ([place]), infrarenal - ulcerative plaque (3.1cm)- 09/10/2019 09/11/2019  . Chronic rhinitis 07/09/2019  . 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 Carpal tunnel syndrome of left wrist 03/15/2019  . Anxiety 10/24/2018  . Numbness of left hand 10/19/2018  . Spastic hemiparesis affecting dominant side ([place]) 09/20/2018  . Primary osteoarthritis of both knees 07/04/2018  . Iron deficiency 04/23/2018  . Atherosclerosis of aorta ([place]) 02/02/2018  . Osteoarthritis of left knee 01/16/2018  . Osteoarthritis of right knee 01/16/2018  . Sequela of cerebrovascular accident 01/16/2018  . Paroxysmal atrial fibrillation ([place]) 12/19/2017  .  Chronic diastolic CHF (congestive heart failure) ([place]) 12/16/2017  . Benign essential HTN   . Supplemental oxygen dependent   . 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 CAD in native artery 11/23/2017  . NSTEMI (non-ST elevated myocardial infarction) ([place]) 11/16/2017  . Urticaria 10/13/2017  . Angioedema   . Left middle  cerebral artery stroke ([place]) 08/28/2017  . Cerebrovascular accident (CVA) due to occlusion of left middle cerebral artery ([place])   . Right hemiplegia ([place])   . Dysphagia, post-stroke   . Hyperlipidemia LDL goal <70    Past Surgical History:  Procedure Laterality Date  . BIOPSY  12/02/2019   Procedure: BIOPSY;  Surgeon: Rush Landmark Telford Nab., MD;  Location: [facility];  Service: Gastroenterology;;  . CHOLECYSTECTOMY N/A 04/30/2020   Procedure: LAPAROSCOPIC CHOLECYSTECTOMY WITH INTRAOPERATIVE CHOLANGIOGRAM;  Surgeon: Armandina Gemma, MD;  Location: [facility];  Service: General;  Laterality: N/A;  . CORONARY ARTERY BYPASS GRAFT N/A 11/23/2017   Procedure: CORONARY ARTERY BYPASS GRAFTING (CABG) x 2, ON PUMP, USING LEFT INTERNAL MAMMARY ARTERY AND RIGHT GREATER SAPHENOUS VEIN HARVESTED ENDOSCOPICALLY;  Surgeon: Gaye Pollack, MD;  Location: [facility];  Service: Open Heart Surgery;  Laterality: N/A;  . CRYOABLATION     cervical  . ESOPHAGOGASTRODUODENOSCOPY (EGD) WITH PROPOFOL N/A 12/02/2019   Procedure: ESOPHAGOGASTRODUODENOSCOPY (EGD) WITH PROPOFOL;  Surgeon: Rush Landmark Telford Nab., MD;  Location: [facility];  Service: Gastroenterology;  Laterality: N/A;  . ESOPHAGOGASTRODUODENOSCOPY (EGD) WITH PROPOFOL N/A 03/19/2020   Procedure: ESOPHAGOGASTRODUODENOSCOPY (EGD) WITH PROPOFOL;  Surgeon: Milus Banister, MD;  [facility];  Service: Endoscopy;  Laterality: N/A;  . EUS N/A 03/19/2020   Procedure: UPPER ENDOSCOPIC ULTRASOUND (EUS) RADIAL;  Surgeon: Milus Banister, MD;  [facility];  Service: Endoscopy;  Laterality: N/A;  . EUS N/A 03/19/2020   Procedure: UPPER ENDOSCOPIC ULTRASOUND (EUS) LINEAR;  Surgeon: Milus Banister, MD;  [facility];  Service: Endoscopy;  Laterality: N/A;  . FINE NEEDLE ASPIRATION  12/02/2019   Procedure: FINE NEEDLE ASPIRATION (FNA) LINEAR;  Surgeon: Irving Copas., MD;  Location: [facility];  Service: Gastroenterology;;  . FINE NEEDLE  ASPIRATION N/A 03/19/2020   Procedure: FINE NEEDLE ASPIRATION (FNA) LINEAR;  Surgeon: Milus Banister, MD;  [facility];  Service: Endoscopy;  Laterality: N/A;  . IR ANGIO INTRA EXTRACRAN SEL COM CAROTID INNOMINATE UNI R MOD SED  08/21/2017  . IR ANGIO VERTEBRAL SEL SUBCLAVIAN INNOMINATE UNI R MOD SED  08/21/2017  . IR PERCUTANEOUS ART THROMBECTOMY/INFUSION INTRACRANIAL INC DIAG ANGIO  08/21/2017  . IR RADIOLOGIST EVAL & MGMT  10/30/2017  . LOOP RECORDER INSERTION N/A 08/28/2017   Procedure: LOOP RECORDER INSERTION;  Surgeon: Constance Haw, MD;  Location: [facility];  Service: Cardiovascular;  Laterality: N/A;  . RADIOLOGY WITH ANESTHESIA N/A 08/21/2017   Procedure: RADIOLOGY WITH ANESTHESIA;  Surgeon: Luanne Bras, MD;  Location: [facility];  Service: Radiology;  Laterality: N/A;  . RIGHT/LEFT HEART CATH AND CORONARY ANGIOGRAPHY N/A 11/20/2017   Procedure: RIGHT/LEFT HEART CATH AND CORONARY ANGIOGRAPHY;  Surgeon: Jettie Booze, MD;  Location: [facility];  Service: Cardiovascular;  Laterality: N/A;  . TEE WITHOUT CARDIOVERSION N/A 08/28/2017   Procedure: TRANSESOPHAGEAL ECHOCARDIOGRAM (TEE);  Surgeon: Fay Records, MD;  Location: [facility];  Service: Cardiovascular;  Laterality: N/A;  . TEE WITHOUT CARDIOVERSION N/A 11/23/2017   Procedure: TRANSESOPHAGEAL ECHOCARDIOGRAM (TEE);  Surgeon: Gaye Pollack, MD;  Location: [facility];  Service: Open Heart Surgery;  Laterality: N/A;  .  TUBAL LIGATION    . UPPER ESOPHAGEAL ENDOSCOPIC ULTRASOUND (EUS) N/A 12/02/2019   Procedure: UPPER ESOPHAGEAL ENDOSCOPIC ULTRASOUND (EUS);  Surgeon: Irving Copas., MD;  Location: [facility];  Service: Gastroenterology;  Laterality: N/A;    OB History    Gravida  2   Para  2   Term      Preterm      AB      Living  2     SAB      TAB      Ectopic      Multiple      Live Births             Family History  Problem Relation Age of Onset  . Heart  attack Father   . Hypertension Mother   . Hyperlipidemia Mother   . Diabetes type II Mother   . Urticaria Mother   . Hypertension Brother   . Breast cancer Cousin 110  . Immunodeficiency Neg Hx   . Eczema Neg Hx   . Atopy Neg Hx   . Asthma Neg Hx   . Angioedema Neg Hx   . Allergic rhinitis Neg Hx   . Colon cancer Neg Hx   . Esophageal cancer Neg Hx   . Stomach cancer Neg Hx   . Pancreatic cancer Neg Hx   . Colon polyps Neg Hx   . Esophageal varices Neg Hx   . Inflammatory bowel disease Neg Hx   . Rectal cancer Neg Hx    Social History   Tobacco Use  . Smoking status: Current Some Day Smoker    Packs/day: 0.10    Years: 37.00    Pack years: 3.70    Types: Cigarettes  . Smokeless tobacco: Never Used  Vaping Use  . Vaping Use: Never used  Substance Use Topics  . Alcohol use: No  . Drug use: No    Home Medications Prior to Admission medications   Medication Sig Start Date End Date Taking? Authorizing Provider  acetaminophen (TYLENOL) 325 MG tablet Take 2 tablets (650 mg total) by mouth every 6 (six) hours as needed for mild pain or moderate pain (or Fever >/= 101). 09/13/19   Rai, Ripudeep K, MD  azelastine (ASTELIN) 0.1 % nasal spray Place 1-2 sprays into both nostrils 2 (two) times daily as needed for rhinitis. Use in each nostril as directed Patient taking differently: Place 1-2 sprays into both nostrils daily as needed for rhinitis. Use in each nostril as directed 09/17/19   Bobbitt, [place] Muta, MD  baclofen (LIORESAL) 10 MG tablet TAKE 1 TABLET BY MOUTH THREE TIMES DAILY . APPOINTMENT REQUIRED FOR FUTURE REFILLS 06/08/20   Jamse Arn, MD  calcium carbonate (TUMS - DOSED IN MG ELEMENTAL CALCIUM) 500 MG chewable tablet Chew 1 tablet by mouth 2 (two) times daily as needed for indigestion or heartburn.     [provider]  carvedilol (COREG) 3.125 MG tablet Take 1 tablet (3.125 mg total) by mouth in the morning and at bedtime. 03/16/20   Satira Sark,  MD  cefdinir (OMNICEF) 300 MG capsule Take 1 capsule (300 mg total) by mouth 2 (two) times daily. 06/16/20   [name], [name], DO  cholecalciferol (VITAMIN D) 1000 units tablet Take 1,000 Units by mouth daily.    [provider]  diclofenac Sodium (VOLTAREN) 1 % GEL APPLY 2 GRAMS TOPICALLY 4 TIMES A DAY 05/13/20   Jamse Arn, MD  ferrous sulfate 325 (65 FE)  MG tablet Take 325 mg by mouth 2 (two) times a week.     [provider]  hydrOXYzine (ATARAX/VISTARIL) 10 MG tablet Take 1 tablet (10 mg total) by mouth at bedtime. 12/06/19   [name], [name], DO  levocetirizine (XYZAL) 5 MG tablet Take 1 tablet (5 mg total) by mouth every evening. 12/06/19   [name], [name], DO  ondansetron (ZOFRAN ODT) 4 MG disintegrating tablet Take 1 tablet (4 mg total) by mouth every 8 (eight) hours as needed for nausea or vomiting. 12/04/19   Rai, Ripudeep K, MD  pantoprazole (PROTONIX) 40 MG tablet Take 1 tablet (40 mg total) by mouth daily. 04/22/20   [name], [name], DO  polyethylene glycol (MIRALAX / GLYCOLAX) 17 g packet Take 17 g by mouth 2 (two) times daily. 06/16/20   [name], [name], DO  pravastatin (PRAVACHOL) 20 MG tablet Take 1 tablet (20 mg total) by mouth every evening. 02/18/20   Strader, Fransisco Hertz, PA-C  rivaroxaban (XARELTO) 20 MG TABS tablet Take 1 tablet daily with evening meal 05/26/20   Satira Sark, MD  senna-docusate (SENOKOT-S) 8.6-50 MG tablet 2 tabs today, then 1 tab QD 06/16/20   [name], [name], DO  traMADol (ULTRAM) 50 MG tablet TAKE 1 TO 2 TABLETS BY MOUTH EVERY 8 HOURS AS NEEDED FOR MODERATE PAIN Patient taking differently: Take 50-100 mg by mouth at bedtime.  03/09/20   [name], [name], DO  vitamin B-12 (CYANOCOBALAMIN) 100 MCG tablet Take 100 mcg by mouth daily.    [provider]    Allergies    Dilaudid [hydromorphone], Entresto [sacubitril-valsartan], Lopressor [metoprolol tartrate], Allegra allergy [fexofenadine hcl], Nsaids, Gabapentin (once-daily), and  Lipitor [atorvastatin]  Review of Systems   Review of Systems  Physical Exam Updated Vital Signs BP 115/65 (BP Location: Left Arm)   Pulse 74   Temp 98.2 F (36.8 C) (Oral)   Resp 17   Ht 5\' 1"  (1.549 m)   Wt 48.5 kg   LMP  (LMP Unknown) Comment: at age 42  SpO2 95%   BMI 20.22 kg/m   Physical Exam Vitals and nursing note reviewed.  Constitutional:      General: She is not in acute distress.    Appearance: She is normal weight. She is not ill-appearing.  Cardiovascular:     Rate and Rhythm: Normal rate and regular rhythm.     Heart sounds: Normal heart sounds.  Pulmonary:     Effort: Pulmonary effort is normal.  Abdominal:     General: Abdomen is flat. A surgical scar is present. Bowel sounds are normal.     Palpations: Abdomen is soft. There is no shifting dullness, hepatomegaly, splenomegaly or pulsatile mass.     Tenderness: There is abdominal tenderness in the right upper quadrant, epigastric area and left upper quadrant. There is no guarding or rebound. Negative signs include Murphy's sign and McBurney's sign.  Genitourinary:    Adnexa: Right adnexa normal and left adnexa normal.  Skin:    General: Skin is warm and dry.     Capillary Refill: Capillary refill takes less than 2 seconds.  Neurological:     Mental Status: She is alert and oriented to person, place, and time.  Psychiatric:        Mood and Affect: Mood normal.    ED Results / Procedures / Treatments   Labs (all labs ordered are listed, but only abnormal results are displayed) Labs Reviewed - No data to display  EKG  None  Radiology DG Abd 2 Views  Result Date: 06/17/2020 CLINICAL DATA:  Abdominal pain.  Hematuria EXAM: X-RAY ABDOMEN 2 VIEWS COMPARISON:  CT abdomen and pelvis March 17, 2020. FINDINGS: Supine and upright images were obtained. There is moderate stool in the colon. There is no appreciable bowel dilatation or air-fluid level to suggest bowel obstruction. There are surgical clips in the  right upper abdomen. There are probable vascular calcifications in each kidney region. There also vascular calcifications in the iliac arteries and distal aorta. Loop recorder noted on the left inferiorly. Atelectatic change noted in left base. IMPRESSION: No bowel obstruction or free air. Moderate stool in colon. Probable vascular calcifications in the kidney regions. Aortoiliac artery atherosclerotic calcification also noted. Aortic Atherosclerosis (ICD10-I70.0). Electronically Signed   By: Lowella Grip III M.D.   On: 06/17/2020 09:05   Procedures Procedures (including critical care time)  Medications Ordered in ED Medications - No data to display  ED Course  I have reviewed the triage vital signs and the nursing notes.  Pertinent labs & imaging results that were available during my care of the patient were reviewed by me and considered in my medical decision making (see chart for details).   MDM Rules/Calculators/A&P                         Pt with recurrent pancreatitis with unknown etiology presents today with acute exacerbation. Lipase at PCP office yesterday 2,840 with epigastric/RUQ pain radiating to back with nausea. Consulted patient's GI doctor Cirigliano who indicated she would be appropriate for referral to [facility] for further workup. Patient was agreeable to this plan. Her symptoms improved with IV morphine and her vitals remained stable. Update: Spoke with provider at [facility] who informed me of their divert policy at this time does not allow for patient to be transferred to their facility unless for procedure that cannot be completed at our facility. - ordering repeat abd/pelv CT to monitor pseudocyst and call out to admit to hospitalist.  - spoke to hospitalist for admission. Final Clinical Impression(s) / ED Diagnoses Final diagnoses:  None   Rx / DC Orders ED Discharge Orders    None       Richarda Osmond, DO 06/17/20 1246    Doristine Mango L, DO 06/17/20  1515    Little, Wenda Overland, MD 06/23/20 1616

## 2020-06-17 NOTE — Plan of Care (Signed)

## 2020-06-17 NOTE — Telephone Encounter (Signed)
Spoke with patient regarding results and PCP recommendations.  Patient verbalized understanding, plans to go to either [facility] or [facility] emergency room.

## 2020-06-17 NOTE — Progress Notes (Signed)
Pt arrived via stretcher from ED, able to ambulate to the bathroom with just stand-by assist, voiding in hat, A&Ox4, vss, skin intact other than a tiny scab on left outer knee, lungs clear, abd soft, +bsx4, pain on left side mid/lower abd, IV fluids hooked up, medicated for pain per [facility], admission completed, admission booklet given to patient, all questions/concerns addressed, will be NPO for now, bed locked and low, call bell at bedside, patient on cell phone with husband currently giving him an update, will continue to monitor throughout the shift.

## 2020-06-17 NOTE — H&P (Signed)
.  History and Physical    [name] DOB: [birth year] DOA: 06/17/2020  PCP: [name], DO  Patient coming from: Home   Chief Complaint: Abdominal pain  HPI: [name] is a 59 y.o. female with medical history significant of recurrent pancreatitis. Presents w/ 4-day history of abdominal pain. B/l upper quadrants with radiation to her back. Consistent w/ previous episodes of pancreatitis (she has had 9 in the last year per her report). She reports associated nausea and decreased appetite. She has also suffered w/ constipation. She is able to tolerate liquids and her meds, but unable to eat food at this time. She denies any other aggravating or alleviating factors.   EDP spoke w/ GI. She has had extensive w/u here. They feel she would be better served to follow up with [facility]. [facility] is not [facility] at this time. [facility] called for admission.   Review of Systems: Remainder of 10 point review of systems is otherwise negative for all not mentioned in HPI.    Past Medical History:  Diagnosis Date  . AAA (abdominal aortic aneurysm) ([place])    a. 3.3cm infrarenal by CT 02/2020.  . Angio-edema   . Arthritis   . CAD (coronary artery disease)    a. s/p NSTEMI in 10/2017 with cath showing LM disease --> s/p CABG on 11/23/2017 with LIMA-LAD and SVG-OM.   [name] Carotid stenosis    a. Duplex 11/2018 6-38% RICA, 46-65% LICA.  [name] Gastritis and duodenitis 12/06/2019   11/2019 Peptic duodenitis  - No dysplasia or malignancy identified  B. STOMACH, BIOPSY:  - Mild chronic gastritis with intestinal metaplasia   . Hay fever   . History of kidney stones   . Ischemic cardiomyopathy    a. EF 35-40% by echo in 10/2017. b. 55% by echo 01/2020.  . NSTEMI (non-ST elevated myocardial infarction) ([place])   . PAF (paroxysmal atrial fibrillation) ([place])    Diagnosed by ILR  . Pancreatitis   . Pilonidal cyst 10/21/2019  . Pneumonia 11/16/2017  . Stroke [facility]) 08/23/2017   Left MCA  infarct status post TPA and thrombectomy  . Urticaria     Past Surgical History:  Procedure Laterality Date  . BIOPSY  12/02/2019   Procedure: BIOPSY;  Surgeon: Rush Landmark Telford Nab., MD;  Location: [place];  Service: Gastroenterology;;  . CHOLECYSTECTOMY N/A 04/30/2020   Procedure: LAPAROSCOPIC CHOLECYSTECTOMY WITH INTRAOPERATIVE CHOLANGIOGRAM;  Surgeon: Armandina Gemma, MD;  Location: [facility];  Service: General;  Laterality: N/A;  . CORONARY ARTERY BYPASS GRAFT N/A 11/23/2017   Procedure: CORONARY ARTERY BYPASS GRAFTING (CABG) x 2, ON PUMP, USING LEFT INTERNAL MAMMARY ARTERY AND RIGHT GREATER SAPHENOUS VEIN HARVESTED ENDOSCOPICALLY;  Surgeon: Gaye Pollack, MD;  Location: [place];  Service: Open Heart Surgery;  Laterality: N/A;  . CRYOABLATION     cervical  . ESOPHAGOGASTRODUODENOSCOPY (EGD) WITH PROPOFOL N/A 12/02/2019   Procedure: ESOPHAGOGASTRODUODENOSCOPY (EGD) WITH PROPOFOL;  Surgeon: Rush Landmark Telford Nab., MD;  Location: [place];  Service: Gastroenterology;  Laterality: N/A;  . ESOPHAGOGASTRODUODENOSCOPY (EGD) WITH PROPOFOL N/A 03/19/2020   Procedure: ESOPHAGOGASTRODUODENOSCOPY (EGD) WITH PROPOFOL;  Surgeon: Milus Banister, MD;  [facility];  Service: Endoscopy;  Laterality: N/A;  . EUS N/A 03/19/2020   Procedure: UPPER ENDOSCOPIC ULTRASOUND (EUS) RADIAL;  Surgeon: Milus Banister, MD;  [facility];  Service: Endoscopy;  Laterality: N/A;  . EUS N/A 03/19/2020   Procedure: UPPER ENDOSCOPIC ULTRASOUND (EUS) LINEAR;  Surgeon: Milus Banister, MD;  [facility];  Service: Endoscopy;  Laterality: N/A;  . FINE NEEDLE ASPIRATION  12/02/2019   Procedure: FINE NEEDLE ASPIRATION (FNA) LINEAR;  Surgeon: Irving Copas., MD;  Location: [place];  Service: Gastroenterology;;  . FINE NEEDLE ASPIRATION N/A 03/19/2020   Procedure: FINE NEEDLE ASPIRATION (FNA) LINEAR;  Surgeon: Milus Banister, MD;  [facility];  Service: Endoscopy;   Laterality: N/A;  . IR ANGIO INTRA EXTRACRAN SEL COM CAROTID INNOMINATE UNI R MOD SED  08/21/2017  . IR ANGIO VERTEBRAL SEL SUBCLAVIAN INNOMINATE UNI R MOD SED  08/21/2017  . IR PERCUTANEOUS ART THROMBECTOMY/INFUSION INTRACRANIAL INC DIAG ANGIO  08/21/2017  . IR RADIOLOGIST EVAL & MGMT  10/30/2017  . LOOP RECORDER INSERTION N/A 08/28/2017   Procedure: LOOP RECORDER INSERTION;  Surgeon: Constance Haw, MD;  Location: [place] [facility];  Service: Cardiovascular;  Laterality: N/A;  . RADIOLOGY WITH ANESTHESIA N/A 08/21/2017   Procedure: RADIOLOGY WITH ANESTHESIA;  Surgeon: Luanne Bras, MD;  Location: [place];  Service: Radiology;  Laterality: N/A;  . RIGHT/LEFT HEART CATH AND CORONARY ANGIOGRAPHY N/A 11/20/2017   Procedure: RIGHT/LEFT HEART CATH AND CORONARY ANGIOGRAPHY;  Surgeon: Jettie Booze, MD;  Location: [place] [facility];  Service: Cardiovascular;  Laterality: N/A;  . TEE WITHOUT CARDIOVERSION N/A 08/28/2017   Procedure: TRANSESOPHAGEAL ECHOCARDIOGRAM (TEE);  Surgeon: Fay Records, MD;  Location: [place];  Service: Cardiovascular;  Laterality: N/A;  . TEE WITHOUT CARDIOVERSION N/A 11/23/2017   Procedure: TRANSESOPHAGEAL ECHOCARDIOGRAM (TEE);  Surgeon: Gaye Pollack, MD;  Location: [place];  Service: Open Heart Surgery;  Laterality: N/A;  . TUBAL LIGATION    . UPPER ESOPHAGEAL ENDOSCOPIC ULTRASOUND (EUS) N/A 12/02/2019   Procedure: UPPER ESOPHAGEAL ENDOSCOPIC ULTRASOUND (EUS);  Surgeon: Irving Copas., MD;  Location: [place];  Service: Gastroenterology;  Laterality: N/A;     reports that she has been smoking cigarettes. She has a 3.70 pack-year smoking history. She has never used smokeless tobacco. She reports that she does not drink alcohol and does not use drugs.  Allergies  Allergen Reactions  . Dilaudid [Hydromorphone] Other (See Comments)    Hallucinations  . Entresto [Sacubitril-Valsartan] Swelling and Rash    Welts, possible angioedema   .  Lopressor [Metoprolol Tartrate] Swelling    Angioedema  . Allegra Allergy [Fexofenadine Hcl] Hives  . Nsaids     On Xarelto  . Gabapentin (Once-Daily) Rash  . Lipitor [Atorvastatin] Diarrhea    Family History  Problem Relation Age of Onset  . Heart attack Father   . Hypertension Mother   . Hyperlipidemia Mother   . Diabetes type II Mother   . Urticaria Mother   . Hypertension Brother   . Breast cancer Cousin 78  . Immunodeficiency Neg Hx   . Eczema Neg Hx   . Atopy Neg Hx   . Asthma Neg Hx   . Angioedema Neg Hx   . Allergic rhinitis Neg Hx   . Colon cancer Neg Hx   . Esophageal cancer Neg Hx   . Stomach cancer Neg Hx   . Pancreatic cancer Neg Hx   . Colon polyps Neg Hx   . Esophageal varices Neg Hx   . Inflammatory bowel disease Neg Hx   . Rectal cancer Neg Hx     Prior to Admission medications   Medication Sig Start Date End Date Taking? Authorizing Provider  acetaminophen (TYLENOL) 325 MG tablet Take 2 tablets (650 mg total) by mouth every 6 (six) hours as  needed for mild pain or moderate pain (or Fever >/= 101). Patient taking differently: Take 325 mg by mouth every 6 (six) hours as needed for mild pain or moderate pain (or Fever >/= 101).  09/13/19  Yes Rai, Ripudeep K, MD  azelastine (ASTELIN) 0.1 % nasal spray Place 1-2 sprays into both nostrils 2 (two) times daily as needed for rhinitis. Use in each nostril as directed Patient taking differently: Place 1-2 sprays into both nostrils daily as needed for rhinitis. Use in each nostril as directed 09/17/19  Yes Bobbitt, [place] Muta, MD  baclofen (LIORESAL) 10 MG tablet TAKE 1 TABLET BY MOUTH THREE TIMES DAILY . APPOINTMENT REQUIRED FOR FUTURE REFILLS Patient taking differently: Take 10 mg by mouth in the morning and at bedtime.  06/08/20  Yes Jamse Arn, MD  calcium carbonate (TUMS - DOSED IN MG ELEMENTAL CALCIUM) 500 MG chewable tablet Chew 1 tablet by mouth 2 (two) times daily as needed for indigestion or  heartburn.    Yes [provider]  carvedilol (COREG) 3.125 MG tablet Take 1 tablet (3.125 mg total) by mouth in the morning and at bedtime. 03/16/20  Yes Satira Sark, MD  cefdinir (OMNICEF) 300 MG capsule Take 1 capsule (300 mg total) by mouth 2 (two) times daily. Patient taking differently: Take 300 mg by mouth 2 (two) times daily. Start date : 06/17/20 06/16/20  Yes [name], [name], DO  cholecalciferol (VITAMIN D) 1000 units tablet Take 1,000 Units by mouth daily.   Yes [provider]  diclofenac Sodium (VOLTAREN) 1 % GEL APPLY 2 GRAMS TOPICALLY 4 TIMES A DAY Patient taking differently: Apply 2 g topically in the morning and at bedtime.  05/13/20  Yes Jamse Arn, MD  ferrous sulfate 325 (65 FE) MG tablet Take 325 mg by mouth 2 (two) times a week. Mondays & Thursdays.   Yes [provider]  hydrOXYzine (ATARAX/VISTARIL) 10 MG tablet Take 1 tablet (10 mg total) by mouth at bedtime. 12/06/19  Yes [name], [name], DO  levocetirizine (XYZAL) 5 MG tablet Take 1 tablet (5 mg total) by mouth every evening. 12/06/19  Yes [name], [name], DO  ondansetron (ZOFRAN ODT) 4 MG disintegrating tablet Take 1 tablet (4 mg total) by mouth every 8 (eight) hours as needed for nausea or vomiting. 12/04/19  Yes Rai, Ripudeep K, MD  pantoprazole (PROTONIX) 40 MG tablet Take 1 tablet (40 mg total) by mouth daily. 04/22/20  Yes [name], [name], DO  polyethylene glycol (MIRALAX / GLYCOLAX) 17 g packet Take 17 g by mouth 2 (two) times daily. 06/16/20  Yes [name], [name], DO  pravastatin (PRAVACHOL) 20 MG tablet Take 1 tablet (20 mg total) by mouth every evening. 02/18/20  Yes Strader, [place] M, PA-C  rivaroxaban (XARELTO) 20 MG TABS tablet Take 1 tablet daily with evening meal Patient taking differently: Take 20 mg by mouth daily with supper.  05/26/20  Yes Satira Sark, MD  senna-docusate (SENOKOT-S) 8.6-50 MG tablet 2 tabs today, then 1 tab QD Patient taking differently: Take 1  tablet by mouth daily. 2 tabs today, then 1 tab QD 06/16/20  Yes [name], [name], DO  traMADol (ULTRAM) 50 MG tablet TAKE 1 TO 2 TABLETS BY MOUTH EVERY 8 HOURS AS NEEDED FOR MODERATE PAIN Patient taking differently: Take 50 mg by mouth at bedtime.  03/09/20  Yes [name], [name], DO  vitamin B-12 (CYANOCOBALAMIN) 100 MCG tablet Take 100 mcg by mouth daily.   Yes  [provider]    Physical Exam: Vitals:   06/17/20 1649 06/17/20 1800 06/17/20 1830 06/17/20 1900  BP: 95/63 114/68 (!) 111/46 (!) 101/57  Pulse: 81 66 78 77  Resp: 18 18  18   Temp:      TempSrc:      SpO2: 96% 99% 100% 98%  Weight:      Height:        Constitutional: 58 y.o. female NAD, calm, comfortable Vitals:   06/17/20 1649 06/17/20 1800 06/17/20 1830 06/17/20 1900  BP: 95/63 114/68 (!) 111/46 (!) 101/57  Pulse: 81 66 78 77  Resp: 18 18  18   Temp:      TempSrc:      SpO2: 96% 99% 100% 98%  Weight:      Height:       General: 59 y.o. female resting in bed in NAD Eyes: PERRL, normal sclera ENMT: Nares patent w/o discharge, orophaynx clear, dentition normal, ears w/o discharge/lesions/ulcers Cardiovascular: RRR, +S1, S2, no m/g/r, equal pulses throughout Respiratory: CTABL, no w/r/r, normal WOB GI: BS+, TTP RUQ/LLQ, no masses noted, no organomegaly noted MSK: No e/c/c Skin: No rashes, bruises, ulcerations noted Neuro: A&O x 3, no focal deficits Psyc: Appropriate interaction and affect, calm/cooperative     Labs on Admission: I have personally reviewed following labs and imaging studies  CBC: Recent Labs  Lab 06/16/20 1517 06/17/20 1157  WBC 14.2* 11.6*  NEUTROABS 10,735*  --   HGB 15.7* 13.5  HCT 46.1* 40.0  MCV 93.7 95.0  PLT 381 562   Basic Metabolic Panel: Recent Labs  Lab 06/16/20 1517 06/17/20 1157  NA 134* 131*  K 4.5 4.1  CL 97* 96*  CO2 26 25  GLUCOSE 60* 78  BUN 18 18  CREATININE 0.67 0.71  CALCIUM 9.8 8.9   GFR: Estimated Creatinine Clearance: 56.4 mL/min (by C-G  formula based on SCr of 0.71 mg/dL). Liver Function Tests: Recent Labs  Lab 06/16/20 1517 06/17/20 1157  AST 20 20  ALT 10 11  ALKPHOS  --  58  BILITOT 0.6 0.6  PROT 6.9 6.8  ALBUMIN  --  3.4*   Recent Labs  Lab 06/16/20 1517 06/17/20 1157  LIPASE 2,840* 157*   No results for input(s): AMMONIA in the last 168 hours. Coagulation Profile: No results for input(s): INR, PROTIME in the last 168 hours. Cardiac Enzymes: No results for input(s): CKTOTAL, CKMB, CKMBINDEX, TROPONINI in the last 168 hours. BNP (last 3 results) No results for input(s): PROBNP in the last 8760 hours. HbA1C: No results for input(s): HGBA1C in the last 72 hours. CBG: No results for input(s): GLUCAP in the last 168 hours. Lipid Profile: No results for input(s): CHOL, HDL, LDLCALC, TRIG, CHOLHDL, LDLDIRECT in the last 72 hours. Thyroid Function Tests: No results for input(s): TSH, T4TOTAL, FREET4, T3FREE, THYROIDAB in the last 72 hours. Anemia Panel: No results for input(s): VITAMINB12, FOLATE, FERRITIN, TIBC, IRON, RETICCTPCT in the last 72 hours. Urine analysis:    Component Value Date/Time   COLORURINE YELLOW 06/16/2020 1452   APPEARANCEUR CLOUDY (A) 06/16/2020 1452   LABSPEC 1.015 06/16/2020 1452   PHURINE 5.5 06/16/2020 1452   GLUCOSEU NEGATIVE 06/16/2020 1452   HGBUR 3+ (A) 06/16/2020 1452   BILIRUBINUR neg 06/16/2020 1445   KETONESUR 2+ (A) 06/16/2020 1452   PROTEINUR 1+ (A) 06/16/2020 1452   PROTEINUR Positive (A) 06/16/2020 1445   UROBILINOGEN 0.2 06/16/2020 1445   NITRITE neg 06/16/2020 1445   NITRITE NEGATIVE 03/17/2020 1308  LEUKOCYTESUR Trace (A) 06/16/2020 1445   LEUKOCYTESUR NEGATIVE 03/17/2020 0948    Radiological Exams on Admission: CT ABDOMEN PELVIS W CONTRAST  Result Date: 06/17/2020 CLINICAL DATA:  Left upper quadrant pain. EXAM: CT ABDOMEN AND PELVIS WITH CONTRAST TECHNIQUE: Multidetector CT imaging of the abdomen and pelvis was performed using the standard protocol  following bolus administration of intravenous contrast. CONTRAST:  196mL OMNIPAQUE IOHEXOL 300 MG/ML  SOLN COMPARISON:  March 17, 2020 FINDINGS: Lower chest: Mild bibasilar atelectasis. Hepatobiliary: No focal liver abnormality is seen. Status post cholecystectomy. No biliary dilatation. Pancreas: A 1.9 cm x 1.8 cm well-defined area of low attenuation is seen within the head of the pancreas. This is decreased in size when compared to the prior study (measured 3.1 cm x 2.6 cm on the prior study). Mild peripancreatic inflammatory fat stranding is seen. Spleen: No splenic injury or perisplenic hematoma. Adrenals/Urinary Tract: The right adrenal gland is normal in size and appearance. A 1.1 cm x 0.8 cm low-attenuation left adrenal mass is seen. Kidneys are normal in size, without renal calculi or hydronephrosis. Multiple subcentimeter cysts are seen within the left kidney. Bladder is unremarkable. Stomach/Bowel: Stomach is within normal limits. Appendix appears normal. No evidence of bowel wall thickening, distention, or inflammatory changes. Noninflamed diverticula are seen within the descending and sigmoid colon. A 2.4 cm x 2.2 cm area of focal asymmetric lateral rectal wall thickening is noted on the right (axial CT image 81, CT series number 2). Asymmetric rectal wall thickening is seen within this region on the prior study (axial CT image 81, CT series number 2). Vascular/Lymphatic: There is marked severity calcification and atherosclerosis of the abdominal aorta and bilateral common iliac arteries. Stable 3.1 cm x 3.0 cm aneurysmal dilatation of the infrarenal abdominal aorta is noted. No enlarged abdominal or pelvic lymph nodes. Reproductive: Multiple tortuous vessels are seen along the lateral aspects of a normal appearing uterus. The bilateral adnexa are unremarkable. Other: No abdominal wall hernia or abnormality. No abdominopelvic ascites. Musculoskeletal: No acute or significant osseous findings. IMPRESSION:  1. Mild peripancreatic inflammatory fat stranding which may represent sequelae associated with acute pancreatitis. Correlation with pancreatic enzymes is recommended. 2. Stable 3.1 cm x 3.0 cm infrarenal abdominal aortic aneurysm. 3. Asymmetric rectal wall thickening, as described above. The presence of a soft tissue mass cannot be excluded correlation with physical examination is recommended. 4. Colonic diverticulosis. 5. Stable low-attenuation left adrenal mass which may represent an adrenal adenoma. 6. Aortic atherosclerosis. Aortic Atherosclerosis (ICD10-I70.0). Electronically Signed   By: Virgina Norfolk M.D.   On: 06/17/2020 16:11   DG Abd 2 Views  Result Date: 06/17/2020 CLINICAL DATA:  Abdominal pain.  Hematuria EXAM: X-RAY ABDOMEN 2 VIEWS COMPARISON:  CT abdomen and pelvis March 17, 2020. FINDINGS: Supine and upright images were obtained. There is moderate stool in the colon. There is no appreciable bowel dilatation or air-fluid level to suggest bowel obstruction. There are surgical clips in the right upper abdomen. There are probable vascular calcifications in each kidney region. There also vascular calcifications in the iliac arteries and distal aorta. Loop recorder noted on the left inferiorly. Atelectatic change noted in left base. IMPRESSION: No bowel obstruction or free air. Moderate stool in colon. Probable vascular calcifications in the kidney regions. Aortoiliac artery atherosclerotic calcification also noted. Aortic Atherosclerosis (ICD10-I70.0). Electronically Signed   By: Lowella Grip III M.D.   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     - started on cefdinir in clinic yesterday for UTI; continue for now   DVT prophylaxis: xarelto  Code Status: FULL  Family Communication: None at bedside   Consults called: GI called by EDP  Admission status: Inpatient d/t need for IV pain control and severity of illness  Status is: Inpatient  Remains inpatient appropriate because:Ongoing active pain requiring inpatient pain management   Dispo: The patient is from: Home              Anticipated d/c is to: Home              Anticipated d/c date is: 3 days              Patient currently is not medically stable to d/c.  Jonnie Finner DO [facility]  If 7PM-7AM, please contact night-coverage [email or web address]  06/17/2020, 7:30 PM

## 2020-06-17 NOTE — ED Notes (Signed)
Patient transported to CT 

## 2020-06-18 LAB — COMPREHENSIVE METABOLIC PANEL
ALT: 10 U/L (ref 0–44)
AST: 17 U/L (ref 15–41)
Albumin: 2.8 g/dL — ABNORMAL LOW (ref 3.5–5.0)
Alkaline Phosphatase: 54 U/L (ref 38–126)
Anion gap: 11 (ref 5–15)
BUN: 12 mg/dL (ref 6–20)
CO2: 23 mmol/L (ref 22–32)
Calcium: 8.1 mg/dL — ABNORMAL LOW (ref 8.9–10.3)
Chloride: 103 mmol/L (ref 98–111)
Creatinine, Ser: 0.53 mg/dL (ref 0.44–1.00)
GFR calc Af Amer: >60 mL/min (ref >60)
GFR calc non Af Amer: >60 mL/min (ref >60)
Glucose, Bld: 48 mg/dL — ABNORMAL LOW (ref 70–99)
Potassium: 3.6 mmol/L (ref 3.5–5.1)
Sodium: 137 mmol/L (ref 135–145)
Total Bilirubin: 0.7 mg/dL (ref 0.3–1.2)
Total Protein: 5.7 g/dL — ABNORMAL LOW (ref 6.5–8.1)

## 2020-06-18 LAB — CBC
HCT: 38.1 % (ref 36.0–46.0)
Hemoglobin: 12.4 g/dL (ref 12.0–15.0)
MCH: 32 pg (ref 26.0–34.0)
MCHC: 32.5 g/dL (ref 30.0–36.0)
MCV: 98.4 fL (ref 80.0–100.0)
Platelets: 242 10*3/uL (ref 150–400)
RBC: 3.87 MIL/uL (ref 3.87–5.11)
RDW: 13.6 % (ref 11.5–15.5)
WBC: 8.6 10*3/uL (ref 4.0–10.5)
nRBC: 0 % (ref 0.0–0.2)

## 2020-06-18 LAB — OSMOLALITY, URINE: Osmolality, Ur: 472 mOsm/kg (ref 300–900)

## 2020-06-18 LAB — SODIUM, URINE, RANDOM: Sodium, Ur: 77 mmol/L

## 2020-06-18 MED ORDER — BOOST / RESOURCE BREEZE PO LIQD CUSTOM
1.0000 | Freq: Three times a day (TID) | ORAL | Status: DC
  Administered 2020-06-18 – 2020-06-20 (×5): 1 via ORAL

## 2020-06-18 MED ORDER — ADULT MULTIVITAMIN W/MINERALS CH
1.0000 | ORAL_TABLET | Freq: Every day | ORAL | Status: DC
  Administered 2020-06-18 – 2020-06-20 (×3): 1 via ORAL
  Filled 2020-06-18 (×3): qty 1

## 2020-06-18 NOTE — Progress Notes (Signed)
Initial Nutrition Assessment  DOCUMENTATION CODES:   Not applicable  INTERVENTION:  Boost Breeze po TID, each supplement provides 250 kcal and 9 grams of protein  MVI with minerals daily  With diet advancement, will provide Ensure Enlive po BID, each supplement provides 350 kcal and 20 grams of protein (likes chocolate flavor)  Pt is at high risk for refeeding given report of no po intake x 4 days prior to admission, recommend monitoring K, P, and Mg daily as diet po intake improves.   NUTRITION DIAGNOSIS:   Inadequate oral intake related to poor appetite, nausea as evidenced by per patient/family report.    GOAL:   Patient will meet greater than or equal to 90% of their needs    MONITOR:   Weight trends, Labs, I & O's, Supplement acceptance, PO intake, Diet advancement  REASON FOR ASSESSMENT:   Malnutrition Screening Tool    ASSESSMENT:  RD working remotely.  59 year old female with history significant of AAA, CAD s/p CABG in 2019, carotid stenosis, ischemic cardiomyopathy, paroxysmal Afib, left MCA infarct s/p thrombectomy, mild chronic gastritis, and multiple hosptial admissions due to recurrent pancreatitis. Pt admitted with acute on chronic pancreatitis with pancreatic cyst after presenting with 4 day history of abdominal pain and nausea.  Spoke with pt via phone, she reports feeling tired this afternoon. Diet advanced to clears, reports drinking some of lunch tray and recalls chicken broth, grape juice, orange gelatin, and tea. She is agreeable to [employer] supplement to aid with meeting needs. Pt endorses usually having a good appetite at home, but has been unable to eat anything since Saturday due to nausea and pain. Patient is at high risk for refeeding, recommend monitoring  magnesium, potassium, and phosphorus daily as diet advances and po intake improves.  Per chart, weights have trended down 5.5 lbs (4.9%) since June which is insignificant for time frame.  Weights stable over the past 2 months.  Medications reviewed and include: Baclofen, Cefdinir, Protonix, Senolot, B12 IVF: NaCl @ 100 ml/hr  Labs: K 3.6 (WNL) trending down   NUTRITION - FOCUSED PHYSICAL EXAM: Unable to complete at this time, RD working remotely.  Diet Order:   Diet Order            Diet clear liquid Room service appropriate? Yes; Fluid consistency: Thin  Diet effective now                 EDUCATION NEEDS:   Not appropriate for education at this time  Skin:  Skin Assessment: Reviewed RN Assessment  Last BM:  9/22  Height:   Ht Readings from Last 1 Encounters:  06/17/20 5\' 1"  (1.549 m)    Weight:   Wt Readings from Last 1 Encounters:  06/17/20 48.5 kg    Ideal Body Weight:  47.7 kg  BMI:  Body mass index is 20.22 kg/m.  Estimated Nutritional Needs:   Kcal:  1400-1600  Protein:  70-80  Fluid:  >/= 1.4 L/day   Lajuan Lines, RD, LDN Clinical Nutrition After Hours/Weekend Pager # in [place]

## 2020-06-18 NOTE — Plan of Care (Signed)
  Problem: Education: Goal: Knowledge of General Education information will improve Description: Including pain rating scale, medication(s)/side effects and non-pharmacologic comfort measures Outcome: Progressing   Problem: Clinical Measurements: Goal: Will remain free from infection Outcome: Progressing   

## 2020-06-18 NOTE — Progress Notes (Signed)
PROGRESS NOTE    [name]  INO:[id number] DOB: 04-08-1961 DOA: 06/17/2020 PCP: [name], DO    Chief Complaint  Patient presents with  . Abdominal Pain    Brief Narrative:   Prior history of recurrent pancreatitis presents with 4-day history of nominal pain associated with some nausea.  As per the patient she has so far about 9 admissions for acute episodes of pancreatitis.  She was recently discharged from the surgery service after cholecystectomy.  EDP spoke with gastroenterology on arrival to [facility].  Gastroenterology felt that she would be better served to follow with [facility] gastroenterology.  Unfortunately [facility] is not accepting transfers at this time and she was referred to [facility] for admission. 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 Improving, Lipase levels have improved patient is able to tolerate oral sips of water.  Abdominal pain is tolerable.  She was started on clears for now and continue to monitor.  Repeat lipase levels in the morning.  CT of the abdomen and pelvis reviewed showed decrease in the size of the pancreatic cyst with mild peripancreatic stranding. Continue with IV fluids and IV pain control.   Paroxysmal atrial fibrillation Rate controlled continue with Xarelto for anticoagulation and Coreg for rate control.    GERD Continue with PPI   Recently diagnosed urinary tract infection was on Ceftin a day prior to admission continue the same to complete the course.    Hyponatremia To dehydration, resolved with fluids.    Leukocytosis Resolved    Hyperlipidemia Continue with statin. Liver enzymes are within normal limits.   DVT prophylaxis: (Lovenox) Code Status: (Full code) Family Communication:  None at bedside.  Disposition:   Status is: Inpatient  Remains inpatient appropriate because:IV treatments appropriate due to intensity of illness  or inability to take PO   Dispo: The patient is from: Home              Anticipated d/c is to: Home              Anticipated d/c date is: 2 days              Patient currently is not medically stable to d/c.       Consultants:   None.   Procedures:CT abd and pelvis.   Antimicrobials: None.   Subjective: abd is improving.   Objective: Vitals:   06/17/20 1930 06/17/20 2019 06/18/20 0130 06/18/20 0530  BP: 106/60 123/78 (!) 104/56 (!) 106/59  Pulse: 79 81 88 99  Resp:  17 18 15   Temp:  97.9 F (36.6 C) 98.2 F (36.8 C) 98 F (36.7 C)  TempSrc:  Oral Oral   SpO2: 97% 92% 92% 92%  Weight:      Height:        Intake/Output Summary (Last 24 hours) at 06/18/2020 1132 Last data filed at 06/18/2020 0600 Gross per 24 hour  Intake 1958 ml  Output 1000 ml  Net 958 ml   Filed Weights   06/17/20 1038  Weight: 48.5 kg    Examination:  General exam: Appears calm and comfortable  Respiratory system: Clear to auscultation. Respiratory effort normal. Cardiovascular system: S1 & S2 heard, RRR. No JVD, murmurs,No pedal edema. Gastrointestinal system: Abdomen is  Soft, tender in the epigastric area, non distended.  Normal bowel sounds heard. Central nervous system: Alert and oriented. No focal neurological deficits. Extremities: Symmetric 5 x 5  power. Skin: No rashes, lesions or ulcers Psychiatry: Mood & affect appropriate.     Data Reviewed: I have personally reviewed following labs and imaging studies  CBC: Recent Labs  Lab 06/16/20 1517 06/17/20 1157 06/18/20 0355  WBC 14.2* 11.6* 8.6  NEUTROABS 10,735*  --   --   HGB 15.7* 13.5 12.4  HCT 46.1* 40.0 38.1  MCV 93.7 95.0 98.4  PLT 381 287 518    Basic Metabolic Panel: Recent Labs  Lab 06/16/20 1517 06/17/20 1157 06/18/20 0355  NA 134* 131* 137  K 4.5 4.1 3.6  CL 97* 96* 103  CO2 26 25 23   GLUCOSE 60* 78 48*  BUN 18 18 12   CREATININE 0.67 0.71 0.53  CALCIUM 9.8 8.9 8.1*    GFR: Estimated  Creatinine Clearance: 56.4 mL/min (by C-G formula based on SCr of 0.53 mg/dL).  Liver Function Tests: Recent Labs  Lab 06/16/20 1517 06/17/20 1157 06/18/20 0355  AST 20 20 17   ALT 10 11 10   ALKPHOS  --  58 54  BILITOT 0.6 0.6 0.7  PROT 6.9 6.8 5.7*  ALBUMIN  --  3.4* 2.8*    CBG: No results for input(s): GLUCAP in the last 168 hours.   Recent Results (from the past 240 hour(s))  SARS Coronavirus 2 by RT PCR (hospital order, performed in [facility] hospital lab) Nasopharyngeal Nasopharyngeal Swab     Status: None   Collection Time: 06/17/20 11:57 AM   Specimen: Nasopharyngeal Swab  Result Value Ref Range Status   SARS Coronavirus 2 NEGATIVE NEGATIVE Final    Comment: (NOTE) SARS-CoV-2 target nucleic acids are NOT DETECTED.  The SARS-CoV-2 RNA is generally detectable in upper and lower respiratory specimens during the acute phase of infection. The lowest concentration of SARS-CoV-2 viral copies this assay can detect is 250 copies / mL. A negative result does not preclude SARS-CoV-2 infection and should not be used as the sole basis for treatment or other patient management decisions.  A negative result may occur with improper specimen collection / handling, submission of specimen other than nasopharyngeal swab, presence of viral mutation(s) within the areas targeted by this assay, and inadequate number of viral copies (<250 copies / mL). A negative result must be combined with clinical observations, patient history, and epidemiological information.  Fact Sheet for Patients:   StrictlyIdeas.no  Fact Sheet for Healthcare Providers: BankingDealers.co.za  This test is not yet approved or  cleared by the [place] FDA and has been authorized for detection and/or diagnosis of SARS-CoV-2 by FDA under an Emergency Use Authorization (EUA).  This EUA will remain in effect (meaning this test can be used) for the duration of  the COVID-19 declaration under Section 564(b)(1) of the Act, 21 U.S.C. section 360bbb-3(b)(1), unless the authorization is terminated or revoked sooner.  Performed at [facility], [place] [address], [place], [place] [address]          Radiology Studies: CT ABDOMEN PELVIS W CONTRAST  Result Date: 06/17/2020 CLINICAL DATA:  Left upper quadrant pain. EXAM: CT ABDOMEN AND PELVIS WITH CONTRAST TECHNIQUE: Multidetector CT imaging of the abdomen and pelvis was performed using the standard protocol following bolus administration of intravenous contrast. CONTRAST:  136mL OMNIPAQUE IOHEXOL 300 MG/ML  SOLN COMPARISON:  March 17, 2020 FINDINGS: Lower chest: Mild bibasilar atelectasis. Hepatobiliary: No focal liver abnormality is seen. Status post cholecystectomy. No biliary dilatation. Pancreas: A 1.9 cm x 1.8 cm well-defined area of low attenuation is seen within the head  of the pancreas. This is decreased in size when compared to the prior study (measured 3.1 cm x 2.6 cm on the prior study). Mild peripancreatic inflammatory fat stranding is seen. Spleen: No splenic injury or perisplenic hematoma. Adrenals/Urinary Tract: The right adrenal gland is normal in size and appearance. A 1.1 cm x 0.8 cm low-attenuation left adrenal mass is seen. Kidneys are normal in size, without renal calculi or hydronephrosis. Multiple subcentimeter cysts are seen within the left kidney. Bladder is unremarkable. Stomach/Bowel: Stomach is within normal limits. Appendix appears normal. No evidence of bowel wall thickening, distention, or inflammatory changes. Noninflamed diverticula are seen within the descending and sigmoid colon. A 2.4 cm x 2.2 cm area of focal asymmetric lateral rectal wall thickening is noted on the right (axial CT image 81, CT series number 2). Asymmetric rectal wall thickening is seen within this region on the prior study (axial CT image 81, CT series number 2). Vascular/Lymphatic: There is  marked severity calcification and atherosclerosis of the abdominal aorta and bilateral common iliac arteries. Stable 3.1 cm x 3.0 cm aneurysmal dilatation of the infrarenal abdominal aorta is noted. No enlarged abdominal or pelvic lymph nodes. Reproductive: Multiple tortuous vessels are seen along the lateral aspects of a normal appearing uterus. The bilateral adnexa are unremarkable. Other: No abdominal wall hernia or abnormality. No abdominopelvic ascites. Musculoskeletal: No acute or significant osseous findings. IMPRESSION: 1. Mild peripancreatic inflammatory fat stranding which may represent sequelae associated with acute pancreatitis. Correlation with pancreatic enzymes is recommended. 2. Stable 3.1 cm x 3.0 cm infrarenal abdominal aortic aneurysm. 3. Asymmetric rectal wall thickening, as described above. The presence of a soft tissue mass cannot be excluded correlation with physical examination is recommended. 4. Colonic diverticulosis. 5. Stable low-attenuation left adrenal mass which may represent an adrenal adenoma. 6. Aortic atherosclerosis. Aortic Atherosclerosis (ICD10-I70.0). Electronically Signed   By: Virgina Norfolk M.D.   On: 06/17/2020 16:11   DG Abd 2 Views  Result Date: 06/17/2020 CLINICAL DATA:  Abdominal pain.  Hematuria EXAM: X-RAY ABDOMEN 2 VIEWS COMPARISON:  CT abdomen and pelvis March 17, 2020. FINDINGS: Supine and upright images were obtained. There is moderate stool in the colon. There is no appreciable bowel dilatation or air-fluid level to suggest bowel obstruction. There are surgical clips in the right upper abdomen. There are probable vascular calcifications in each kidney region. There also vascular calcifications in the iliac arteries and distal aorta. Loop recorder noted on the left inferiorly. Atelectatic change noted in left base. IMPRESSION: No bowel obstruction or free air. Moderate stool in colon. Probable vascular calcifications in the kidney regions. Aortoiliac artery  atherosclerotic calcification also noted. Aortic Atherosclerosis (ICD10-I70.0). Electronically Signed   By: Lowella Grip III M.D.   On: 06/17/2020 09:05        Scheduled Meds: . baclofen  10 mg Oral BID  . carvedilol  3.125 mg Oral BID WC  . cefdinir  300 mg Oral BID  . cetirizine  10 mg Oral QPM  . cholecalciferol  1,000 Units Oral Daily  . hydrOXYzine  10 mg Oral QHS  . pantoprazole  40 mg Oral Daily  . pravastatin  20 mg Oral QPM  . rivaroxaban  20 mg Oral Q supper  . senna-docusate  1 tablet Oral Daily  . vitamin B-12  100 mcg Oral Daily   Continuous Infusions: . sodium chloride 100 mL/hr at 06/18/20 0600     LOS: 1 day       Hosie Poisson, MD [facility]   To contact the attending provider between 7A-7P or the covering provider during after hours 7P-7A, please log into the web site [email or web address] and access using universal  password for that web site. If you do not have the password, please call the hospital operator.  06/18/2020, 11:32 AM

## 2020-06-19 DIAGNOSIS — K8500 Idiopathic acute pancreatitis without necrosis or infection: Secondary | ICD-10-CM

## 2020-06-19 LAB — BASIC METABOLIC PANEL
Anion gap: 11 (ref 5–15)
BUN: 6 mg/dL (ref 6–20)
CO2: 24 mmol/L (ref 22–32)
Calcium: 8.5 mg/dL — ABNORMAL LOW (ref 8.9–10.3)
Chloride: 102 mmol/L (ref 98–111)
Creatinine, Ser: 0.47 mg/dL (ref 0.44–1.00)
GFR calc Af Amer: >60 mL/min (ref >60)
GFR calc non Af Amer: >60 mL/min (ref >60)
Glucose, Bld: 77 mg/dL (ref 70–99)
Potassium: 3.5 mmol/L (ref 3.5–5.1)
Sodium: 137 mmol/L (ref 135–145)

## 2020-06-19 LAB — URINALYSIS W MICROSCOPIC + REFLEX CULTURE
Bacteria, UA: NONE SEEN /HPF
Bilirubin Urine: NEGATIVE
Glucose, UA: NEGATIVE
Hyaline Cast: NONE SEEN /LPF
Nitrites, Initial: NEGATIVE
RBC / HPF: >=60 /HPF — AB (ref 0–2)
Specific Gravity, Urine: 1.015 (ref 1.001–1.03)
pH: 5.5 (ref 5.0–8.0)

## 2020-06-19 LAB — CULTURE INDICATED

## 2020-06-19 LAB — URINE CULTURE
MICRO NUMBER:: 10978690
SPECIMEN QUALITY:: ADEQUATE

## 2020-06-19 LAB — GLUCOSE, CAPILLARY: Glucose-Capillary: 75 mg/dL (ref 70–99)

## 2020-06-19 LAB — LIPASE, BLOOD: Lipase: 129 U/L — ABNORMAL HIGH (ref 11–51)

## 2020-06-19 MED ORDER — TRIAMCINOLONE ACETONIDE 0.1 % EX CREA
TOPICAL_CREAM | Freq: Four times a day (QID) | CUTANEOUS | Status: DC
  Filled 2020-06-19: qty 15

## 2020-06-19 MED ORDER — PANCRELIPASE (LIP-PROT-AMYL) 12000-38000 UNITS PO CPEP
12000.0000 [IU] | ORAL_CAPSULE | Freq: Three times a day (TID) | ORAL | Status: DC
  Administered 2020-06-19 – 2020-06-20 (×3): 12000 [IU] via ORAL
  Filled 2020-06-19 (×3): qty 1

## 2020-06-19 MED ORDER — TRAMADOL HCL 50 MG PO TABS
50.0000 mg | ORAL_TABLET | Freq: Once | ORAL | Status: AC
  Administered 2020-06-19: 50 mg via ORAL
  Filled 2020-06-19: qty 1

## 2020-06-19 NOTE — Consult Note (Signed)
Consultation  Referring Provider:     Hosie Poisson, MD Primary Care Physician:  [name], DO Primary Gastroenterologist:       Dr. Bryan Lemma Reason for Consultation:     Acute recurrent pancreatitis         HPI:   [name] is a 59 y.o. female with a history of CVA, CAD/two-vessel CABG, CHF, atrial fibrillation (on Xarelto), well-known to me for history of acute recurrent pancreatitis.  Last seen by me in the office on 05/07/2020, and was doing well at that time following cholecystectomy with [place] on 04/30/2020.  GI history as below.  Unfortunately, she developed index symptoms of abdominal pain, nausea earlier this week.  Was seen by her PCM on 06/16/2020, and labs again notable for acute recurrent pancreatitis with lipase 2840, WBC 14.2, H/H 15.7/46.1.  Otherwise normal CMP with preserved renal function.  Given labs and ongoing symptoms, she presented to the ER on 06/17/2020.  On arrival, WBC down trended to 11.6, H/H 13.5/40, normal BUN/creatinine, lipase 157.  Admission CT with 1.8 x 1.9 cm area of low attenuation in the HOP, decreased in size from previous.  Mild peripancreatic inflammation consistent with pancreatitis.  Otherwise normal bile ducts.  ER appropriately made contact with [facility] to facilitate transfer per our previous plan, but unfortunately due to bed status, transfer not possible, and was admitted here for further evaluation and treatment.  Thankfully, she has improved clinically since admission, and today states she is tolerating slow advancement in diet, pain is pretty much minimal, and no longer with any nausea.  GI History:  -Admitted 12/15-18,2020 with acute pancreatitis with unclear etiology. -CT notable for 2.2 cm cyst versus pseudocyst in the head of pancreas.  -Readmitted 1/17-18, 2021 with acute recurrent pancreatitis. -CT acute pancreatitis, slight improvement in size of pseudocyst, wall thickening of prepyloric stomach and proximal duodenum (possibly  reactive) -IgG4 negative -Lasix stopped  -Readmitted 2/10-13, 2021 with acute recurrent pancreatitis -Lipase>10k, improvedto 837 then 74 by d/c -WBC 17.5, hemoconcentration, normal BUN/creatinine -Blood cultures negative -Admission CT with possible cystic duct stones peripancreatic phlegmon and edemac/wacute pancreatitis. Stable cystic lesion in HOP -RUQ [place]: No gallstones, CBD 5 mm, hypoechoic HOP lesion (cystic) -MRI/MRCP 09/05/2020: Hepatic steatosis, mild GB distention, focally ectatic CBD measuring 8 mm without CDL. Pancreatic edema c/w AP, 1.8 x 1.3 cm heterogenous structure (favoring pseudocyst), previously 1.8 x 1.9 cm. Cystic duct stones not appreciated. -Liver enzymes remained normal, but consideration for microlithiasis as etiology -Stopped Entresto  -Readmitted 3/5-9,2021 -CT: Decreased peripancreatic inflammation around the head/uncinate process, but now inflammation around first/second portions of duodenum with duodenal wall thickening. Stable pancreatic head pseudocyst -EUS (12/02/2019, [name] Landmark): 1.2 cm pancreatic cyst (FNA: No malignancy), Biopsy of HOP (no malignancy). Peptic duodenitis, gastritis with intestinal metaplasia without H. pylori  -Readmitted 6/22-29, 2021 with acute recurrent idiopathic pancreatitis, which was extensively evaluated as below: -CT 03/17/2020: 3.1 x 2.6 cm rounded density in pancreatic head increased in size since May 2021, lytic destruction of right greater trochanter with associated pathologic fracture -MRCP 03/19/2020: 2.5 x 2.4 cm complex fluid collection in HOP with subtle rim enhancement -EUS 03/19/2020: Abnormal HOP without discrete solid or anechoic cystic lesions (FNA: No malignancy).  Normal CBD.  Recommend repeat MRI in 2-3 months and ccy -MRI right femur 03/20/2020: Enhancing 4 cm marrow replacing lesion and greater trochanter with pathologic fracture -Whole-body scan 03/23/2020: Abnormal uptake in right greater trochanter,  uptake at healing lateral left ninth rib fracture, nonspecific  uptake at C6/C7 -IR right femur biopsy 03/23/2020: No malignancy  - Cholecystectomy with IOC on 05/31/1739 with uncomplicated postop course and discharged the following day (GB path: cholelithiasis)  -Developed rash with pantoprazole. Stopped and changed to Pepcid for treatment of gastritis/duodenitis without issue.   Past Medical History:  Diagnosis Date  . AAA (abdominal aortic aneurysm) ([place])    a. 3.3cm infrarenal by CT 02/2020.  . Angio-edema   . Arthritis   . CAD (coronary artery disease)    a. s/p NSTEMI in 10/2017 with cath showing LM disease --> s/p CABG on 11/23/2017 with LIMA-LAD and SVG-OM.   [name] Carotid stenosis    a. Duplex 11/2018 8-14% RICA, 48-18% LICA.  [name] Gastritis and duodenitis 12/06/2019   11/2019 Peptic duodenitis  - No dysplasia or malignancy identified  B. STOMACH, BIOPSY:  - Mild chronic gastritis with intestinal metaplasia   . Hay fever   . History of kidney stones   . Ischemic cardiomyopathy    a. EF 35-40% by echo in 10/2017. b. 55% by echo 01/2020.  . NSTEMI (non-ST elevated myocardial infarction) ([place])   . PAF (paroxysmal atrial fibrillation) ([place])    Diagnosed by ILR  . Pancreatitis   . Pilonidal cyst 10/21/2019  . Pneumonia 11/16/2017  . Stroke [facility]) 08/23/2017   Left MCA infarct status post TPA and thrombectomy  . Urticaria     Past Surgical History:  Procedure Laterality Date  . BIOPSY  12/02/2019   Procedure: BIOPSY;  Surgeon: Rush Landmark Telford Nab., MD;  Location: [place];  Service: Gastroenterology;;  . CHOLECYSTECTOMY N/A 04/30/2020   Procedure: LAPAROSCOPIC CHOLECYSTECTOMY WITH INTRAOPERATIVE CHOLANGIOGRAM;  Surgeon: Armandina Gemma, MD;  Location: [facility];  Service: General;  Laterality: N/A;  . CORONARY ARTERY BYPASS GRAFT N/A 11/23/2017   Procedure: CORONARY ARTERY BYPASS GRAFTING (CABG) x 2, ON PUMP, USING LEFT INTERNAL MAMMARY ARTERY AND RIGHT GREATER SAPHENOUS VEIN HARVESTED  ENDOSCOPICALLY;  Surgeon: Gaye Pollack, MD;  Location: [place];  Service: Open Heart Surgery;  Laterality: N/A;  . CRYOABLATION     cervical  . ESOPHAGOGASTRODUODENOSCOPY (EGD) WITH PROPOFOL N/A 12/02/2019   Procedure: ESOPHAGOGASTRODUODENOSCOPY (EGD) WITH PROPOFOL;  Surgeon: Rush Landmark Telford Nab., MD;  Location: [place];  Service: Gastroenterology;  Laterality: N/A;  . ESOPHAGOGASTRODUODENOSCOPY (EGD) WITH PROPOFOL N/A 03/19/2020   Procedure: ESOPHAGOGASTRODUODENOSCOPY (EGD) WITH PROPOFOL;  Surgeon: Milus Banister, MD;  [facility];  Service: Endoscopy;  Laterality: N/A;  . EUS N/A 03/19/2020   Procedure: UPPER ENDOSCOPIC ULTRASOUND (EUS) RADIAL;  Surgeon: Milus Banister, MD;  [facility];  Service: Endoscopy;  Laterality: N/A;  . EUS N/A 03/19/2020   Procedure: UPPER ENDOSCOPIC ULTRASOUND (EUS) LINEAR;  Surgeon: Milus Banister, MD;  [facility];  Service: Endoscopy;  Laterality: N/A;  . FINE NEEDLE ASPIRATION  12/02/2019   Procedure: FINE NEEDLE ASPIRATION (FNA) LINEAR;  Surgeon: Irving Copas., MD;  Location: [place];  Service: Gastroenterology;;  . FINE NEEDLE ASPIRATION N/A 03/19/2020   Procedure: FINE NEEDLE ASPIRATION (FNA) LINEAR;  Surgeon: Milus Banister, MD;  [facility];  Service: Endoscopy;  Laterality: N/A;  . IR ANGIO INTRA EXTRACRAN SEL COM CAROTID INNOMINATE UNI R MOD SED  08/21/2017  . IR ANGIO VERTEBRAL SEL SUBCLAVIAN INNOMINATE UNI R MOD SED  08/21/2017  . IR PERCUTANEOUS ART THROMBECTOMY/INFUSION INTRACRANIAL INC DIAG ANGIO  08/21/2017  . IR RADIOLOGIST EVAL & MGMT  10/30/2017  . LOOP RECORDER INSERTION N/A 08/28/2017   Procedure: LOOP RECORDER INSERTION;  Surgeon:  Constance Haw, MD;  Location: [place] [facility];  Service: Cardiovascular;  Laterality: N/A;  . RADIOLOGY WITH ANESTHESIA N/A 08/21/2017   Procedure: RADIOLOGY WITH ANESTHESIA;  Surgeon: Luanne Bras, MD;  Location: [place];  Service:  Radiology;  Laterality: N/A;  . RIGHT/LEFT HEART CATH AND CORONARY ANGIOGRAPHY N/A 11/20/2017   Procedure: RIGHT/LEFT HEART CATH AND CORONARY ANGIOGRAPHY;  Surgeon: Jettie Booze, MD;  Location: [place] [facility];  Service: Cardiovascular;  Laterality: N/A;  . TEE WITHOUT CARDIOVERSION N/A 08/28/2017   Procedure: TRANSESOPHAGEAL ECHOCARDIOGRAM (TEE);  Surgeon: Fay Records, MD;  Location: [place];  Service: Cardiovascular;  Laterality: N/A;  . TEE WITHOUT CARDIOVERSION N/A 11/23/2017   Procedure: TRANSESOPHAGEAL ECHOCARDIOGRAM (TEE);  Surgeon: Gaye Pollack, MD;  Location: [place];  Service: Open Heart Surgery;  Laterality: N/A;  . TUBAL LIGATION    . UPPER ESOPHAGEAL ENDOSCOPIC ULTRASOUND (EUS) N/A 12/02/2019   Procedure: UPPER ESOPHAGEAL ENDOSCOPIC ULTRASOUND (EUS);  Surgeon: Irving Copas., MD;  Location: [place];  Service: Gastroenterology;  Laterality: N/A;    Family History  Problem Relation Age of Onset  . Heart attack Father   . Hypertension Mother   . Hyperlipidemia Mother   . Diabetes type II Mother   . Urticaria Mother   . Hypertension Brother   . Breast cancer Cousin 64  . Immunodeficiency Neg Hx   . Eczema Neg Hx   . Atopy Neg Hx   . Asthma Neg Hx   . Angioedema Neg Hx   . Allergic rhinitis Neg Hx   . Colon cancer Neg Hx   . Esophageal cancer Neg Hx   . Stomach cancer Neg Hx   . Pancreatic cancer Neg Hx   . Colon polyps Neg Hx   . Esophageal varices Neg Hx   . Inflammatory bowel disease Neg Hx   . Rectal cancer Neg Hx     Social History   Tobacco Use  . Smoking status: Current Some Day Smoker    Packs/day: 0.10    Years: 37.00    Pack years: 3.70    Types: Cigarettes  . Smokeless tobacco: Never Used  Vaping Use  . Vaping Use: Never used  Substance Use Topics  . Alcohol use: No  . Drug use: No    Prior to Admission medications   Medication Sig Start Date End Date Taking? Authorizing Provider  acetaminophen (TYLENOL) 325 MG  tablet Take 2 tablets (650 mg total) by mouth every 6 (six) hours as needed for mild pain or moderate pain (or Fever >/= 101). Patient taking differently: Take 325 mg by mouth every 6 (six) hours as needed for mild pain or moderate pain (or Fever >/= 101).  09/13/19  Yes Rai, Ripudeep K, MD  azelastine (ASTELIN) 0.1 % nasal spray Place 1-2 sprays into both nostrils 2 (two) times daily as needed for rhinitis. Use in each nostril as directed Patient taking differently: Place 1-2 sprays into both nostrils daily as needed for rhinitis. Use in each nostril as directed 09/17/19  Yes Bobbitt, [place] Muta, MD  baclofen (LIORESAL) 10 MG tablet TAKE 1 TABLET BY MOUTH THREE TIMES DAILY . APPOINTMENT REQUIRED FOR FUTURE REFILLS Patient taking differently: Take 10 mg by mouth in the morning and at bedtime.  06/08/20  Yes Jamse Arn, MD  calcium carbonate (TUMS - DOSED IN MG ELEMENTAL CALCIUM) 500 MG chewable tablet Chew 1 tablet by mouth 2 (two) times daily as needed for indigestion or heartburn.  Yes [provider]  carvedilol (COREG) 3.125 MG tablet Take 1 tablet (3.125 mg total) by mouth in the morning and at bedtime. 03/16/20  Yes Satira Sark, MD  cefdinir (OMNICEF) 300 MG capsule Take 1 capsule (300 mg total) by mouth 2 (two) times daily. Patient taking differently: Take 300 mg by mouth 2 (two) times daily. Start date : 06/17/20 06/16/20  Yes [name], [name], DO  cholecalciferol (VITAMIN D) 1000 units tablet Take 1,000 Units by mouth daily.   Yes [provider]  diclofenac Sodium (VOLTAREN) 1 % GEL APPLY 2 GRAMS TOPICALLY 4 TIMES A DAY Patient taking differently: Apply 2 g topically in the morning and at bedtime.  05/13/20  Yes Jamse Arn, MD  ferrous sulfate 325 (65 FE) MG tablet Take 325 mg by mouth 2 (two) times a week. Mondays & Thursdays.   Yes [provider]  hydrOXYzine (ATARAX/VISTARIL) 10 MG tablet Take 1 tablet (10 mg total) by mouth at bedtime.  12/06/19  Yes [name], [name], DO  levocetirizine (XYZAL) 5 MG tablet Take 1 tablet (5 mg total) by mouth every evening. 12/06/19  Yes [name], [name], DO  ondansetron (ZOFRAN ODT) 4 MG disintegrating tablet Take 1 tablet (4 mg total) by mouth every 8 (eight) hours as needed for nausea or vomiting. 12/04/19  Yes Rai, Ripudeep K, MD  pantoprazole (PROTONIX) 40 MG tablet Take 1 tablet (40 mg total) by mouth daily. 04/22/20  Yes [name], [name], DO  polyethylene glycol (MIRALAX / GLYCOLAX) 17 g packet Take 17 g by mouth 2 (two) times daily. 06/16/20  Yes [name], [name], DO  pravastatin (PRAVACHOL) 20 MG tablet Take 1 tablet (20 mg total) by mouth every evening. 02/18/20  Yes Strader, [place] M, PA-C  rivaroxaban (XARELTO) 20 MG TABS tablet Take 1 tablet daily with evening meal Patient taking differently: Take 20 mg by mouth daily with supper.  05/26/20  Yes Satira Sark, MD  senna-docusate (SENOKOT-S) 8.6-50 MG tablet 2 tabs today, then 1 tab QD Patient taking differently: Take 1 tablet by mouth daily. 2 tabs today, then 1 tab QD 06/16/20  Yes [name], [name], DO  traMADol (ULTRAM) 50 MG tablet TAKE 1 TO 2 TABLETS BY MOUTH EVERY 8 HOURS AS NEEDED FOR MODERATE PAIN Patient taking differently: Take 50 mg by mouth at bedtime.  03/09/20  Yes [name], [name], DO  vitamin B-12 (CYANOCOBALAMIN) 100 MCG tablet Take 100 mcg by mouth daily.   Yes [provider]    Current Facility-Administered Medications  Medication Dose Route Frequency Provider Last Rate Last Admin  . 0.9 %  sodium chloride infusion   Intravenous Continuous Kyle, Tyrone A, DO 100 mL/hr at 06/18/20 0600 Rate Verify at 06/18/20 0600  . azelastine (ASTELIN) 0.1 % nasal spray 1-2 spray  1-2 spray Each Nare Daily PRN Cherylann Ratel A, DO      . baclofen (LIORESAL) tablet 10 mg  10 mg Oral BID Marylyn Ishihara, Tyrone A, DO   10 mg at 06/19/20 1114  . carvedilol (COREG) tablet 3.125 mg  3.125 mg Oral BID WC Kyle, Tyrone A, DO   3.125 mg at 06/19/20  1635  . cefdinir (OMNICEF) capsule 300 mg  300 mg Oral BID Marylyn Ishihara, Tyrone A, DO   300 mg at 06/19/20 1114  . cetirizine (ZYRTEC) tablet 10 mg  10 mg Oral QPM Kyle, Tyrone A, DO   10 mg at 06/19/20 1826  . cholecalciferol (VITAMIN D3) tablet 1,000 Units  1,000 Units Oral Daily Kyle, Tyrone A, DO   1,000 Units at 06/19/20 1113  . feeding supplement (BOOST / RESOURCE BREEZE) liquid 1 Container  1 Container Oral TID BM Hosie Poisson, MD   1 Container at 06/19/20 1115  . hydrOXYzine (ATARAX/VISTARIL) tablet 10 mg  10 mg Oral QHS Kyle, Tyrone A, DO   10 mg at 06/18/20 2150  . lipase/protease/amylase (CREON) capsule 12,000 Units  12,000 Units Oral TID WC Hosie Poisson, MD   12,000 Units at 06/19/20 1635  . morphine 2 MG/ML injection 2 mg  2 mg Intravenous Q4H PRN Marylyn Ishihara, Tyrone A, DO   2 mg at 06/18/20 2150  . multivitamin with minerals tablet 1 tablet  1 tablet Oral Daily Hosie Poisson, MD   1 tablet at 06/19/20 1113  . ondansetron (ZOFRAN) tablet 4 mg  4 mg Oral Q6H PRN Marylyn Ishihara, Tyrone A, DO       Or  . ondansetron (ZOFRAN) injection 4 mg  4 mg Intravenous Q6H PRN Marylyn Ishihara, Tyrone A, DO      . pantoprazole (PROTONIX) EC tablet 40 mg  40 mg Oral Daily Kyle, Tyrone A, DO   40 mg at 06/19/20 1114  . pravastatin (PRAVACHOL) tablet 20 mg  20 mg Oral QPM Kyle, Tyrone A, DO   20 mg at 06/19/20 1826  . rivaroxaban (XARELTO) tablet 20 mg  20 mg Oral Q supper Marylyn Ishihara, Tyrone A, DO   20 mg at 06/19/20 1635  . senna-docusate (Senokot-S) tablet 1 tablet  1 tablet Oral Daily Kyle, Tyrone A, DO   1 tablet at 06/18/20 0815  . triamcinolone cream (KENALOG) 0.1 %   Topical QID Hosie Poisson, MD   Given at 06/19/20 1430  . vitamin B-12 (CYANOCOBALAMIN) tablet 100 mcg  100 mcg Oral Daily Kyle, Tyrone A, DO   100 mcg at 06/19/20 1113    Allergies as of 06/17/2020 - Review Complete 06/17/2020  Allergen Reaction Noted  . Dilaudid [hydromorphone] Other (See Comments) 09/24/2019  . Entresto [sacubitril-valsartan] Swelling and Rash  03/20/2020  . Lopressor [metoprolol tartrate] Swelling 09/05/2017  . Allegra allergy [fexofenadine hcl] Hives 07/16/2019  . Nsaids  10/24/2018  . Gabapentin (once-daily) Rash 07/01/2019  . Lipitor [atorvastatin] Diarrhea 10/13/2017     Review of Systems:    As per HPI, otherwise negative    Physical Exam:  Vital signs in last 24 hours: Temp:  [98 F (36.7 C)-98.5 F (36.9 C)] 98.5 F (36.9 C) (09/24 1310) Pulse Rate:  [74-90] 90 (09/24 1310) Resp:  [16-17] 16 (09/24 1310) BP: (110-139)/(69-87) 118/74 (09/24 1310) SpO2:  [93 %-95 %] 94 % (09/24 1310) Last BM Date: 06/19/20 General:   Pleasant female in NAD Head:  Normocephalic and atraumatic. Eyes:   No icterus.   Conjunctiva pink. Ears:  Normal auditory acuity. Neck:  Supple Lungs:  Respirations even and unlabored. Lungs clear to auscultation bilaterally.   No wheezes, crackles, or rhonchi.  Heart:  Regular rate and rhythm; no MRG Abdomen:  Soft, nondistended, nontender. Normal bowel sounds. No appreciable masses or hepatomegaly.  Rectal:  Not performed.  Msk:  Symmetrical without gross deformities.  Extremities:  Without edema. Neurologic:  Alert and  oriented x4;  grossly normal neurologically. Skin:  Intact without significant lesions or rashes. Psych:  Alert and cooperative. Normal affect.  LAB RESULTS: Recent Labs    06/17/20 1157 06/18/20 0355  WBC 11.6* 8.6  HGB 13.5 12.4  HCT 40.0 38.1  PLT 287 242   BMET Recent  Labs    06/17/20 1157 06/18/20 0355 06/19/20 0337  NA 131* 137 137  K 4.1 3.6 3.5  CL 96* 103 102  CO2 25 23 24   GLUCOSE 78 48* 77  BUN 18 12 6   CREATININE 0.71 0.53 0.47  CALCIUM 8.9 8.1* 8.5*   LFT Recent Labs    06/18/20 0355  PROT 5.7*  ALBUMIN 2.8*  AST 17  ALT 10  ALKPHOS 54  BILITOT 0.7   PT/INR No results for input(s): LABPROT, INR in the last 72 hours.  STUDIES: No results found.    Impression / Plan:   1) Idiopathic acute recurrent pancreatitis 2) Atypical  appearing pancreas on CT and EUS  -Has had extensive evaluation of her AR Pancreatitis as outlined above.  Unfortunately, had hoped that recurrent episodes would stop after recent cholecystectomy in 04/2020.  Unfortunately, now presents with recurrent episode.  Unclear etiology, but previously discussed that if recurrence despite [facility] -Will need referral to [facility] service for further evaluation, to include biliary manometry with possible sphincterotomy, genetic testing (i.e. CF TR, PRSS1, SPINK1, etc.).  Given her clinical improvement, do not feel that she needs inpatient-inpatient transfer -Anticipate discharge tomorrow.  Therefore will make arrangements through our clinic for outpatient referral to [facility] -Did discuss that if she has recurrent episode prior to getting in with [facility], would again request transfer for higher level of care and diagnostic/therapeutic capabilities that are not available here -Advancing p.o. intake as tolerated  3) Change in bowel habits -Loose/watery stools on arrival, but much improved today  4) Rectal wall thickening on CT -CT with 2.4 x 2.2 cm area of focal asymmetric lateral rectal wall thickening in the right lateral wall.  This was seen on previous study as well. -Will eventually need colonoscopy  GI service will follow peripherally.    LOS: 2 days   Lavena Bullion  06/19/2020, 6:41 PM

## 2020-06-19 NOTE — Progress Notes (Signed)
PROGRESS NOTE    [name]  SEG:[id number] DOB: [birth year] DOA: 06/17/2020 PCP: [name], DO    Chief Complaint  Patient presents with  . Abdominal Pain    Brief Narrative:   Prior history of recurrent pancreatitis presents with 4-day history of nominal pain associated with some nausea.  As per the patient she has so far about 9 admissions for acute episodes of pancreatitis.  She was recently discharged from the surgery service after cholecystectomy.  EDP spoke with gastroenterology on arrival to [facility].  Gastroenterology felt that she would be better served to follow with [facility] gastroenterology.  Unfortunately [facility] is not accepting transfers at this time and she was referred to [facility] for admission. Pt seen and examined at bedside.  Her abd pain is improving, but not resolved yet.  She reports having some loose stools.    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 Improving, Lipase levels have improved patient is able to tolerate clear liquids, will advance to full liquid diet. .  Abdominal pain is improving, but not resolved yet.   CT of the abdomen and pelvis reviewed showed decrease in the size of the pancreatic cyst with mild peripancreatic stranding. Continue with IV fluids and IV pain control. GI consulted for possible referral to [facility].    Paroxysmal atrial fibrillation Rate controlled continue with Xarelto for anticoagulation and Coreg for rate control.    GERD Continue with PPI   Recently diagnosed urinary tract infection was on Ceftin  prior to admission continue the same to complete the course.    Hyponatremia Secondary to dehydration, resolved with fluids.    Leukocytosis Resolved.     Hyperlipidemia Continue with statin. Liver enzymes are within normal limits.   DVT prophylaxis: (Lovenox) Code Status: (Full code) Family Communication:  None at  bedside.  Disposition:   Status is: Inpatient  Remains inpatient appropriate because:IV treatments appropriate due to intensity of illness or inability to take PO   Dispo: The patient is from: Home              Anticipated d/c is to: Home              Anticipated d/c date is: 1 day              Patient currently is not medically stable to d/c.       Consultants:   None.   Procedures:CT abd and pelvis.   Antimicrobials: None.   Subjective: Nausea, is better, no vomiting, abd pain is improving but resolved yet.  Loose bowel movements last night.   Objective: Vitals:   06/18/20 0530 06/18/20 1224 06/18/20 2055 06/19/20 0553  BP: (!) 106/59 (!) 105/54 139/69 110/87  Pulse: 99 89 74 77  Resp: 15 14 17 16   Temp: 98 F (36.7 C) 98 F (36.7 C) 98 F (36.7 C) 98.2 F (36.8 C)  TempSrc:  Oral Oral Oral  SpO2: 92% 92% 95% 93%  Weight:      Height:        Intake/Output Summary (Last 24 hours) at 06/19/2020 1317 Last data filed at 06/19/2020 7371 Gross per 24 hour  Intake 1614.08 ml  Output --  Net 1614.08 ml   Filed Weights   06/17/20 1038  Weight: 48.5 kg    Examination:  General exam: Alert and comfortable, not in distress Respiratory system: Clear to auscultation bilaterally, no wheezing or  rhonchi  cardiovascular system: S1 S2 heard, RRR, no JVD, no pedal edema.  Gastrointestinal system: Abdomen is  Soft, mildly tender, non distended, bowel sounds wnl.  Central nervous system: alert and oriented, non focal.  Extremities: no pedal edema.  Skin: No rashes seen.  Psychiatry: Mood is appropriate.     Data Reviewed: I have personally reviewed following labs and imaging studies  CBC: Recent Labs  Lab 06/16/20 1517 06/17/20 1157 06/18/20 0355  WBC 14.2* 11.6* 8.6  NEUTROABS 10,735*  --   --   HGB 15.7* 13.5 12.4  HCT 46.1* 40.0 38.1  MCV 93.7 95.0 98.4  PLT 381 287 093    Basic Metabolic Panel: Recent Labs  Lab 06/16/20 1517 06/17/20 1157  06/18/20 0355 06/19/20 0337  NA 134* 131* 137 137  K 4.5 4.1 3.6 3.5  CL 97* 96* 103 102  CO2 26 25 23 24   GLUCOSE 60* 78 48* 77  BUN 18 18 12 6   CREATININE 0.67 0.71 0.53 0.47  CALCIUM 9.8 8.9 8.1* 8.5*    GFR: Estimated Creatinine Clearance: 56.4 mL/min (by C-G formula based on SCr of 0.47 mg/dL).  Liver Function Tests: Recent Labs  Lab 06/16/20 1517 06/17/20 1157 06/18/20 0355  AST 20 20 17   ALT 10 11 10   ALKPHOS  --  58 54  BILITOT 0.6 0.6 0.7  PROT 6.9 6.8 5.7*  ALBUMIN  --  3.4* 2.8*    CBG: Recent Labs  Lab 06/19/20 0548  GLUCAP 75     Recent Results (from the past 240 hour(s))  Urine Culture     Status: Abnormal   Collection Time: 06/16/20  2:52 PM  Result Value Ref Range Status   MICRO NUMBER: [id number]  Final   SPECIMEN QUALITY: Adequate  Final   Sample Source URINE  Final   STATUS: FINAL  Final   ISOLATE 1: Escherichia coli (A)  Final    Comment: 50,000-100,000 CFU/mL of Escherichia coli      Susceptibility   Escherichia coli - URINE CULTURE, REFLEX    AMOX/CLAVULANIC 4 Sensitive     AMPICILLIN <=2 Sensitive     AMPICILLIN/SULBACTAM <=2 Sensitive     CEFAZOLIN* <=4 Not Reportable      * For infections other than uncomplicated UTIcaused by E. coli, K. pneumoniae or P. mirabilis:Cefazolin is resistant if MIC > or = 8 mcg/mL.(Distinguishing susceptible versus intermediatefor isolates with MIC < or = 4 mcg/mL requiresadditional testing.)For uncomplicated UTI caused by E. coli,K. pneumoniae or P. mirabilis: Cefazolin issusceptible if MIC <32 mcg/mL and predictssusceptible to the oral agents cefaclor, cefdinir,cefpodoxime, cefprozil, cefuroxime, cephalexinand loracarbef.    CEFEPIME <=1 Sensitive     CEFTRIAXONE <=1 Sensitive     CIPROFLOXACIN <=0.25 Sensitive     LEVOFLOXACIN <=0.12 Sensitive     ERTAPENEM <=0.5 Sensitive     GENTAMICIN <=1 Sensitive     IMIPENEM <=0.25 Sensitive     NITROFURANTOIN <=16 Sensitive     PIP/TAZO <=4 Sensitive      TOBRAMYCIN <=1 Sensitive     TRIMETH/SULFA* <=20 Sensitive      * For infections other than uncomplicated UTIcaused by E. coli, K. pneumoniae or P. mirabilis:Cefazolin is resistant if MIC > or = 8 mcg/mL.(Distinguishing susceptible versus intermediatefor isolates with MIC < or = 4 mcg/mL requiresadditional testing.)For uncomplicated UTI caused by E. coli,K. pneumoniae or P. mirabilis: Cefazolin issusceptible if MIC <32 mcg/mL and predictssusceptible to the oral agents cefaclor, cefdinir,cefpodoxime, cefprozil, cefuroxime, cephalexinand loracarbef.Legend:S = Susceptible  I =  IntermediateR = Resistant  NS = Not susceptible* = Not tested  NR = Not reported**NN = See antimicrobic comments  SARS Coronavirus 2 by RT PCR (hospital order, performed in [facility] hospital lab) Nasopharyngeal Nasopharyngeal Swab     Status: None   Collection Time: 06/17/20 11:57 AM   Specimen: Nasopharyngeal Swab  Result Value Ref Range Status   SARS Coronavirus 2 NEGATIVE NEGATIVE Final    Comment: (NOTE) SARS-CoV-2 target nucleic acids are NOT DETECTED.  The SARS-CoV-2 RNA is generally detectable in upper and lower respiratory specimens during the acute phase of infection. The lowest concentration of SARS-CoV-2 viral copies this assay can detect is 250 copies / mL. A negative result does not preclude SARS-CoV-2 infection and should not be used as the sole basis for treatment or other patient management decisions.  A negative result may occur with improper specimen collection / handling, submission of specimen other than nasopharyngeal swab, presence of viral mutation(s) within the areas targeted by this assay, and inadequate number of viral copies (<250 copies / mL). A negative result must be combined with clinical observations, patient history, and epidemiological information.  Fact Sheet for Patients:   StrictlyIdeas.no  Fact Sheet for Healthcare  Providers: BankingDealers.co.za  This test is not yet approved or  cleared by the [place] FDA and has been authorized for detection and/or diagnosis of SARS-CoV-2 by FDA under an Emergency Use Authorization (EUA).  This EUA will remain in effect (meaning this test can be used) for the duration of the COVID-19 declaration under Section 564(b)(1) of the Act, 21 U.S.C. section 360bbb-3(b)(1), unless the authorization is terminated or revoked sooner.  Performed at [facility], [place] [address], [place], [place] [address]          Radiology Studies: CT ABDOMEN PELVIS W CONTRAST  Result Date: 06/17/2020 CLINICAL DATA:  Left upper quadrant pain. EXAM: CT ABDOMEN AND PELVIS WITH CONTRAST TECHNIQUE: Multidetector CT imaging of the abdomen and pelvis was performed using the standard protocol following bolus administration of intravenous contrast. CONTRAST:  178mL OMNIPAQUE IOHEXOL 300 MG/ML  SOLN COMPARISON:  March 17, 2020 FINDINGS: Lower chest: Mild bibasilar atelectasis. Hepatobiliary: No focal liver abnormality is seen. Status post cholecystectomy. No biliary dilatation. Pancreas: A 1.9 cm x 1.8 cm well-defined area of low attenuation is seen within the head of the pancreas. This is decreased in size when compared to the prior study (measured 3.1 cm x 2.6 cm on the prior study). Mild peripancreatic inflammatory fat stranding is seen. Spleen: No splenic injury or perisplenic hematoma. Adrenals/Urinary Tract: The right adrenal gland is normal in size and appearance. A 1.1 cm x 0.8 cm low-attenuation left adrenal mass is seen. Kidneys are normal in size, without renal calculi or hydronephrosis. Multiple subcentimeter cysts are seen within the left kidney. Bladder is unremarkable. Stomach/Bowel: Stomach is within normal limits. Appendix appears normal. No evidence of bowel wall thickening, distention, or inflammatory changes. Noninflamed diverticula are seen  within the descending and sigmoid colon. A 2.4 cm x 2.2 cm area of focal asymmetric lateral rectal wall thickening is noted on the right (axial CT image 81, CT series number 2). Asymmetric rectal wall thickening is seen within this region on the prior study (axial CT image 81, CT series number 2). Vascular/Lymphatic: There is marked severity calcification and atherosclerosis of the abdominal aorta and bilateral common iliac arteries. Stable 3.1 cm x 3.0 cm aneurysmal dilatation of the infrarenal abdominal aorta is noted. No enlarged abdominal or pelvic  lymph nodes. Reproductive: Multiple tortuous vessels are seen along the lateral aspects of a normal appearing uterus. The bilateral adnexa are unremarkable. Other: No abdominal wall hernia or abnormality. No abdominopelvic ascites. Musculoskeletal: No acute or significant osseous findings. IMPRESSION: 1. Mild peripancreatic inflammatory fat stranding which may represent sequelae associated with acute pancreatitis. Correlation with pancreatic enzymes is recommended. 2. Stable 3.1 cm x 3.0 cm infrarenal abdominal aortic aneurysm. 3. Asymmetric rectal wall thickening, as described above. The presence of a soft tissue mass cannot be excluded correlation with physical examination is recommended. 4. Colonic diverticulosis. 5. Stable low-attenuation left adrenal mass which may represent an adrenal adenoma. 6. Aortic atherosclerosis. Aortic Atherosclerosis (ICD10-I70.0). Electronically Signed   By: Virgina Norfolk M.D.   On: 06/17/2020 16:11        Scheduled Meds: . baclofen  10 mg Oral BID  . carvedilol  3.125 mg Oral BID WC  . cefdinir  300 mg Oral BID  . cetirizine  10 mg Oral QPM  . cholecalciferol  1,000 Units Oral Daily  . feeding supplement  1 Container Oral TID BM  . hydrOXYzine  10 mg Oral QHS  . lipase/protease/amylase  12,000 Units Oral TID WC  . multivitamin with minerals  1 tablet Oral Daily  . pantoprazole  40 mg Oral Daily  . pravastatin  20  mg Oral QPM  . rivaroxaban  20 mg Oral Q supper  . senna-docusate  1 tablet Oral Daily  . triamcinolone cream   Topical QID  . vitamin B-12  100 mcg Oral Daily   Continuous Infusions: . sodium chloride 100 mL/hr at 06/18/20 0600     LOS: 2 days       Hosie Poisson, MD [facility]   To contact the attending provider between 7A-7P or the covering provider during after hours 7P-7A, please log into the web site [email or web address] and access using universal [place] password for that web site. If you do not have the password, please call the hospital operator.  06/19/2020, 1:17 PM

## 2020-06-19 NOTE — Evaluation (Signed)
Physical Therapy Evaluation Patient Details Name: [name] MRN: [id number] DOB: 28-Jun-1961 Today's Date: 06/19/2020   History of Present Illness  Pt admitted with abd pain 2* acute on chronic pancreatitis.  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  Pt states she is near her baseline and should progress to dc home with family assist.    Follow Up Recommendations No PT follow up    Equipment Recommendations  None recommended by PT    Recommendations for Other Services       Precautions / Restrictions Precautions Precautions: Fall Restrictions Weight Bearing Restrictions: No      Mobility  Bed Mobility Overal bed mobility: Modified Independent             General bed mobility comments: IND in/out of bed  Transfers Overall transfer level: Modified independent Equipment used: None             General transfer comment: Pt to/from beside, recliner and comode unassisted  Ambulation/Gait Ambulation/Gait assistance: Min guard;Supervision Gait Distance (Feet): 450 Feet Assistive device: None Gait Pattern/deviations: Step-through pattern;Shuffle;Steppage;Ataxic Gait velocity: Mod pace   General Gait Details: noted increased BOS, mild steppage on R to compensate for R foot drop (AFO at home but uses it seldom).  Stairs            Wheelchair Mobility    Modified Rankin (Stroke Patients Only)       Balance Overall balance assessment: Needs assistance Sitting-balance support: No upper extremity supported;Feet supported Sitting balance-Leahy Scale: Good     Standing balance support: No upper extremity supported Standing balance-Leahy Scale: Good                               Pertinent Vitals/Pain Pain  Assessment: Faces Faces Pain Scale: Hurts a little bit Pain Location: abdomen and L arm(states from recent flu shot) Pain Descriptors / Indicators: Aching Pain Intervention(s): Limited activity within patient's tolerance;Monitored during session    Home Living Family/patient expects to be discharged to:: Private residence Living Arrangements: Spouse/significant other Available Help at Discharge: Family;Available 24 hours/day Type of Home: House Home Access: Stairs to enter Entrance Stairs-Rails: None Entrance Stairs-Number of Steps: 2 Home Layout: One level Home Equipment: Cane - quad;Bedside commode;Wheelchair - [employer] - 2 wheels Additional Comments: family available 24/7, not on oxygen at home    Prior Function Level of Independence: Independent         Comments: has R hand splint and AFO at home     Hand Dominance   Dominant Hand: Right    Extremity/Trunk Assessment   Upper Extremity Assessment Upper Extremity Assessment: RUE deficits/detail RUE Deficits / Details: Residual deficits from prior CVA; Pt noted to use L UE to mobilize R but using hand functionally to assist with bfast opening packages    Lower Extremity Assessment Lower Extremity Assessment: RLE deficits/detail RLE Deficits / Details: Residual deficits from prior CVA with noted foot drop        Communication   Communication: No difficulties  Cognition Arousal/Alertness: Awake/alert Behavior During Therapy: WFL for tasks assessed/performed Overall Cognitive Status: Within Functional Limits for tasks assessed  General Comments      Exercises     Assessment/Plan    PT Assessment Patient needs continued PT services  PT Problem List Decreased balance       PT Treatment Interventions Gait training;Stair training;Therapeutic activities;Balance training    PT Goals (Current goals can be found in the Care Plan section)  Acute Rehab  PT Goals Patient Stated Goal: HOME PT Goal Formulation: With patient Time For Goal Achievement: 07/03/20 Potential to Achieve Goals: Good    Frequency Min 3X/week   Barriers to discharge        Co-evaluation               AM-PAC PT "6 Clicks" Mobility  Outcome Measure Help needed turning from your back to your side while in a flat bed without using bedrails?: None Help needed moving from lying on your back to sitting on the side of a flat bed without using bedrails?: None Help needed moving to and from a bed to a chair (including a wheelchair)?: None Help needed standing up from a chair using your arms (e.g., wheelchair or bedside chair)?: None Help needed to walk in hospital room?: A Little Help needed climbing 3-5 steps with a railing? : A Little 6 Click Score: 22    End of Session Equipment Utilized During Treatment: Gait belt Activity Tolerance: Patient tolerated treatment well Patient left: in bed;with call bell/phone within reach Nurse Communication: Mobility status PT Visit Diagnosis: Difficulty in walking, not elsewhere classified (R26.2);Ataxic gait (R26.0)    Time: 7425-9563 PT Time Calculation (min) (ACUTE ONLY): 20 min   Charges:   PT Evaluation $PT Eval Low Complexity: 1 Low          [name] PT Acute Rehabilitation Services Pager [phone] Office [phone]   [name] 06/19/2020, 9:49 AM

## 2020-06-19 NOTE — Progress Notes (Signed)
Spoke with pt regarding results. Pt was informed to informed doctor and/or nursing staff to continue tx for UTI while she is hospitalized.

## 2020-06-20 MED ORDER — ADULT MULTIVITAMIN W/MINERALS CH: 1.0000 | ORAL_TABLET | Freq: Every day | ORAL | Status: DC

## 2020-06-20 MED ORDER — ACETAMINOPHEN 325 MG PO TABS
650.0000 mg | ORAL_TABLET | Freq: Four times a day (QID) | ORAL | Status: DC | PRN
  Administered 2020-06-20: 650 mg via ORAL
  Filled 2020-06-20: qty 2

## 2020-06-20 MED ORDER — TRIAMCINOLONE ACETONIDE 0.1 % EX CREA: TOPICAL_CREAM | Freq: Four times a day (QID) | CUTANEOUS | 0 refills | Status: DC

## 2020-06-20 NOTE — Plan of Care (Signed)
Patient was given DC instructions, and all questions were answered. Patient was taken to main entrance via wheelchair.

## 2020-06-21 NOTE — Discharge Summary (Signed)
Physician Discharge Summary  [name] EHU:[id number] DOB: Dec 21, 1960 DOA: 06/17/2020  PCP: Kaitlyn Hillock, DO  Admit date: 06/17/2020 Discharge date: 06/21/2020  Admitted From: Home.  Disposition:  Home.   Recommendations for Outpatient Follow-up:  1. Follow up with PCP in 1-2 weeks 2. Please obtain BMP/CBC in one week Please follow up with [facility] with GI as referred   Discharge Condition:stable.  CODE STATUS:FULL CODE.  Diet recommendation: Heart Healthy   Brief/Interim Summary: 59 year old lady Prior history of recurrent pancreatitis presents with 4-day history of nominal pain associated with some nausea.  As per the patient she has so far about 9 admissions for acute episodes of pancreatitis.  She was recently discharged from the surgery service after cholecystectomy.  EDP spoke with gastroenterology on arrival to [facility].  Gastroenterology felt that she would be better served to follow with [facility] gastroenterology.  Unfortunately [facility] is not accepting transfers at this time and she was referred to [facility] for admission.  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 Resolved.  Lipase levels have improved patient is able to tolerate clear liquids, advanced to soft diet.   CT of the abdomen and pelvis reviewed showed decrease in the size of the pancreatic cyst with mild peripancreatic stranding. GI consulted and she was referred to [facility] [facility] service for further evaluation, to include biliary manometry with possible sphincterotomy, genetic testing (i.e. CF TR, PRSS1, SPINK1, etc.).    Paroxysmal atrial fibrillation Rate controlled continue with Xarelto for anticoagulation and Coreg for rate control.    GERD Stable.    Recently diagnosed urinary tract infection was on Ceftin  prior to admission continue the same to complete the course.   Hyponatremia Secondary to  dehydration, resolved with fluids.    Leukocytosis Resolved.    Hyperlipidemia Continue with statin. Liver enzymes are within normal limits.   Discharge Instructions  Discharge Instructions    Diet - low sodium heart healthy   Complete by: As directed    Discharge instructions   Complete by: As directed    Please follow up with gastroenterology as recommended at [facility].     Allergies as of 06/20/2020      Reactions   Dilaudid [hydromorphone] Other (See Comments)   Hallucinations   Entresto [sacubitril-valsartan] Swelling, Rash   Welts, possible angioedema   Lopressor [metoprolol Tartrate] Swelling   Angioedema   Allegra Allergy [fexofenadine Hcl] Hives   Nsaids    On Xarelto   Gabapentin (once-daily) Rash   Lipitor [atorvastatin] Diarrhea      Medication List    TAKE these medications   acetaminophen 325 MG tablet Commonly known as: TYLENOL Take 2 tablets (650 mg total) by mouth every 6 (six) hours as needed for mild pain or moderate pain (or Fever >/= 101). What changed: how much to take   azelastine 0.1 % nasal spray Commonly known as: ASTELIN Place 1-2 sprays into both nostrils 2 (two) times daily as needed for rhinitis. Use in each nostril as directed What changed: when to take this   baclofen 10 MG tablet Commonly known as: LIORESAL TAKE 1 TABLET BY MOUTH THREE TIMES DAILY . APPOINTMENT REQUIRED FOR FUTURE REFILLS What changed: See the new instructions.   calcium carbonate 500 MG chewable tablet Commonly known as: TUMS - dosed in mg elemental calcium Chew 1 tablet by mouth 2 (two) times daily as needed for indigestion or heartburn.  carvedilol 3.125 MG tablet Commonly known as: COREG Take 1 tablet (3.125 mg total) by mouth in the morning and at bedtime.   cefdinir 300 MG capsule Commonly known as: OMNICEF Take 1 capsule (300 mg total) by mouth 2 (two) times daily. What changed: additional instructions   cholecalciferol 1000 units  tablet Commonly known as: VITAMIN D Take 1,000 Units by mouth daily.   diclofenac Sodium 1 % Gel Commonly known as: VOLTAREN APPLY 2 GRAMS TOPICALLY 4 TIMES Stephenson DAY What changed: See the new instructions.   ferrous sulfate 325 (65 FE) MG tablet Take 325 mg by mouth 2 (two) times Stephenson week. Mondays & Thursdays.   hydrOXYzine 10 MG tablet Commonly known as: ATARAX/VISTARIL Take 1 tablet (10 mg total) by mouth at bedtime.   levocetirizine 5 MG tablet Commonly known as: XYZAL Take 1 tablet (5 mg total) by mouth every evening.   multivitamin with minerals Tabs tablet Take 1 tablet by mouth daily.   ondansetron 4 MG disintegrating tablet Commonly known as: Zofran ODT Take 1 tablet (4 mg total) by mouth every 8 (eight) hours as needed for nausea or vomiting.   pantoprazole 40 MG tablet Commonly known as: PROTONIX Take 1 tablet (40 mg total) by mouth daily.   polyethylene glycol 17 g packet Commonly known as: MIRALAX / GLYCOLAX Take 17 g by mouth 2 (two) times daily.   pravastatin 20 MG tablet Commonly known as: PRAVACHOL Take 1 tablet (20 mg total) by mouth every evening.   rivaroxaban 20 MG Tabs tablet Commonly known as: Xarelto Take 1 tablet daily with evening meal What changed:   how much to take  how to take this  when to take this  additional instructions   senna-docusate 8.6-50 MG tablet Commonly known as: Senokot-S 2 tabs today, then 1 tab QD What changed:   how much to take  how to take this  when to take this   traMADol 50 MG tablet Commonly known as: ULTRAM TAKE 1 TO 2 TABLETS BY MOUTH EVERY 8 HOURS AS NEEDED FOR MODERATE PAIN What changed: See the new instructions.   triamcinolone cream 0.1 % Commonly known as: KENALOG Apply topically 4 (four) times daily.   vitamin B-12 100 MCG tablet Commonly known as: CYANOCOBALAMIN Take 100 mcg by mouth daily.       Follow-up Information    [name], [name], DO. Schedule an appointment as soon as  possible for Stephenson visit in 1 week(s).   Specialty: Family Medicine Contact information: [address] [place] [address] [phone]        Kaitlyn Haw, MD .   Specialty: Cardiology Contact information: [address] [place] [address] [phone]        Satira Sark, MD .   Specialty: Cardiology Contact information: [place] [address] [phone]              Allergies  Allergen Reactions  . Dilaudid [Hydromorphone] Other (See Comments)    Hallucinations  . Entresto [Sacubitril-Valsartan] Swelling and Rash    Welts, possible angioedema   . Lopressor [Metoprolol Tartrate] Swelling    Angioedema  . Allegra Allergy [Fexofenadine Hcl] Hives  . Nsaids     On Xarelto  . Gabapentin (Once-Daily) Rash  . Lipitor [Atorvastatin] Diarrhea    Consultations:  Gastroenterology    Procedures/Studies: CT ABDOMEN PELVIS W CONTRAST  Result Date: 06/17/2020 CLINICAL DATA:  Left upper quadrant pain. EXAM: CT ABDOMEN  AND PELVIS WITH CONTRAST TECHNIQUE: Multidetector CT imaging of the abdomen and pelvis was performed using the standard protocol following bolus administration of intravenous contrast. CONTRAST:  165mL OMNIPAQUE IOHEXOL 300 MG/ML  SOLN COMPARISON:  March 17, 2020 FINDINGS: Lower chest: Mild bibasilar atelectasis. Hepatobiliary: No focal liver abnormality is seen. Status post cholecystectomy. No biliary dilatation. Pancreas: Stephenson 1.9 cm x 1.8 cm well-defined area of low attenuation is seen within the head of the pancreas. This is decreased in size when compared to the prior study (measured 3.1 cm x 2.6 cm on the prior study). Mild peripancreatic inflammatory fat stranding is seen. Spleen: No splenic injury or perisplenic hematoma. Adrenals/Urinary Tract: The right adrenal gland is normal in size and appearance. Stephenson 1.1 cm x 0.8 cm low-attenuation left adrenal mass is seen. Kidneys are normal in size, without renal calculi or  hydronephrosis. Multiple subcentimeter cysts are seen within the left kidney. Bladder is unremarkable. Stomach/Bowel: Stomach is within normal limits. Appendix appears normal. No evidence of bowel wall thickening, distention, or inflammatory changes. Noninflamed diverticula are seen within the descending and sigmoid colon. Stephenson 2.4 cm x 2.2 cm area of focal asymmetric lateral rectal wall thickening is noted on the right (axial CT image 81, CT series number 2). Asymmetric rectal wall thickening is seen within this region on the prior study (axial CT image 81, CT series number 2). Vascular/Lymphatic: There is marked severity calcification and atherosclerosis of the abdominal aorta and bilateral common iliac arteries. Stable 3.1 cm x 3.0 cm aneurysmal dilatation of the infrarenal abdominal aorta is noted. No enlarged abdominal or pelvic lymph nodes. Reproductive: Multiple tortuous vessels are seen along the lateral aspects of Stephenson normal appearing uterus. The bilateral adnexa are unremarkable. Other: No abdominal wall hernia or abnormality. No abdominopelvic ascites. Musculoskeletal: No acute or significant osseous findings. IMPRESSION: 1. Mild peripancreatic inflammatory fat stranding which may represent sequelae associated with acute pancreatitis. Correlation with pancreatic enzymes is recommended. 2. Stable 3.1 cm x 3.0 cm infrarenal abdominal aortic aneurysm. 3. Asymmetric rectal wall thickening, as described above. The presence of Stephenson soft tissue mass cannot be excluded correlation with physical examination is recommended. 4. Colonic diverticulosis. 5. Stable low-attenuation left adrenal mass which may represent an adrenal adenoma. 6. Aortic atherosclerosis. Aortic Atherosclerosis (ICD10-I70.0). Electronically Signed   By: Virgina Norfolk M.D.   On: 06/17/2020 16:11   DG Abd 2 Views  Result Date: 06/17/2020 CLINICAL DATA:  Abdominal pain.  Hematuria EXAM: X-RAY ABDOMEN 2 VIEWS COMPARISON:  CT abdomen and pelvis March 17, 2020. FINDINGS: Supine and upright images were obtained. There is moderate stool in the colon. There is no appreciable bowel dilatation or air-fluid level to suggest bowel obstruction. There are surgical clips in the right upper abdomen. There are probable vascular calcifications in each kidney region. There also vascular calcifications in the iliac arteries and distal aorta. Loop recorder noted on the left inferiorly. Atelectatic change noted in left base. IMPRESSION: No bowel obstruction or free air. Moderate stool in colon. Probable vascular calcifications in the kidney regions. Aortoiliac artery atherosclerotic calcification also noted. Aortic Atherosclerosis (ICD10-I70.0). Electronically Signed   By: Lowella Grip III M.D.   On: 06/17/2020 09:05   CUP PACEART REMOTE DEVICE CHECK  Result Date: 06/08/2020 ILR summary report received. Battery status OK. Normal device function. No new symptom episodes, tachy episodes, brady, or pause episodes. No new AF episodes. Monthly summary reports and ROV/PRN JM      Subjective: No new complaints, able to tolerate  soft diet without any complaints.   Discharge Exam: Vitals:   06/19/20 2040 06/20/20 0449  BP: 126/78 117/76  Pulse: 88 78  Resp: 16 16  Temp: 97.8 F (36.6 C) 97.6 F (36.4 C)  SpO2: 95% 95%   Vitals:   06/19/20 0553 06/19/20 1310 06/19/20 2040 06/20/20 0449  BP: 110/87 118/74 126/78 117/76  Pulse: 77 90 88 78  Resp: 16 16 16 16   Temp: 98.2 F (36.8 C) 98.5 F (36.9 C) 97.8 F (36.6 C) 97.6 F (36.4 C)  TempSrc: Oral Oral Oral Oral  SpO2: 93% 94% 95% 95%  Weight:      Height:        General: Pt is alert, awake, not in acute distress Cardiovascular: RRR, S1/S2 +, no rubs, no gallops Respiratory: CTA bilaterally, no wheezing, no rhonchi Abdominal: Soft, NT, ND, bowel sounds + Extremities: no edema, no cyanosis    The results of significant diagnostics from this hospitalization (including imaging, microbiology,  ancillary and laboratory) are listed below for reference.     Microbiology: Recent Results (from the past 240 hour(s))  Urine Culture     Status: Abnormal   Collection Time: 06/16/20  2:52 PM  Result Value Ref Range Status   MICRO NUMBER: [id number]  Final   SPECIMEN QUALITY: Adequate  Final   Sample Source URINE  Final   STATUS: FINAL  Final   ISOLATE 1: Escherichia coli (Stephenson)  Final    Comment: 50,000-100,000 CFU/mL of Escherichia coli      Susceptibility   Escherichia coli - URINE CULTURE, REFLEX    AMOX/CLAVULANIC 4 Sensitive     AMPICILLIN <=2 Sensitive     AMPICILLIN/SULBACTAM <=2 Sensitive     CEFAZOLIN* <=4 Not Reportable      * For infections other than uncomplicated UTIcaused by E. coli, K. pneumoniae or P. mirabilis:Cefazolin is resistant if MIC > or = 8 mcg/mL.(Distinguishing susceptible versus intermediatefor isolates with MIC < or = 4 mcg/mL requiresadditional testing.)For uncomplicated UTI caused by E. coli,K. pneumoniae or P. mirabilis: Cefazolin issusceptible if MIC <32 mcg/mL and predictssusceptible to the oral agents cefaclor, cefdinir,cefpodoxime, cefprozil, cefuroxime, cephalexinand loracarbef.    CEFEPIME <=1 Sensitive     CEFTRIAXONE <=1 Sensitive     CIPROFLOXACIN <=0.25 Sensitive     LEVOFLOXACIN <=0.12 Sensitive     ERTAPENEM <=0.5 Sensitive     GENTAMICIN <=1 Sensitive     IMIPENEM <=0.25 Sensitive     NITROFURANTOIN <=16 Sensitive     PIP/TAZO <=4 Sensitive     TOBRAMYCIN <=1 Sensitive     TRIMETH/SULFA* <=20 Sensitive      * For infections other than uncomplicated UTIcaused by E. coli, K. pneumoniae or P. mirabilis:Cefazolin is resistant if MIC > or = 8 mcg/mL.(Distinguishing susceptible versus intermediatefor isolates with MIC < or = 4 mcg/mL requiresadditional testing.)For uncomplicated UTI caused by E. coli,K. pneumoniae or P. mirabilis: Cefazolin issusceptible if MIC <32 mcg/mL and predictssusceptible to the oral agents cefaclor, cefdinir,cefpodoxime,  cefprozil, cefuroxime, cephalexinand loracarbef.Legend:S = Susceptible  I = IntermediateR = Resistant  NS = Not susceptible* = Not tested  NR = Not reported**NN = See antimicrobic comments  SARS Coronavirus 2 by RT PCR (hospital order, performed in [facility] hospital lab) Nasopharyngeal Nasopharyngeal Swab     Status: None   Collection Time: 06/17/20 11:57 AM   Specimen: Nasopharyngeal Swab  Result Value Ref Range Status   SARS Coronavirus 2 NEGATIVE NEGATIVE Final    Comment: (NOTE) SARS-CoV-2 target nucleic acids  are NOT DETECTED.  The SARS-CoV-2 RNA is generally detectable in upper and lower respiratory specimens during the acute phase of infection. The lowest concentration of SARS-CoV-2 viral copies this assay can detect is 250 copies / mL. Stephenson negative result does not preclude SARS-CoV-2 infection and should not be used as the sole basis for treatment or other patient management decisions.  Stephenson negative result may occur with improper specimen collection / handling, submission of specimen other than nasopharyngeal swab, presence of viral mutation(s) within the areas targeted by this assay, and inadequate number of viral copies (<250 copies / mL). Stephenson negative result must be combined with clinical observations, patient history, and epidemiological information.  Fact Sheet for Patients:   StrictlyIdeas.no  Fact Sheet for Healthcare Providers: BankingDealers.co.za  This test is not yet approved or  cleared by the [place] FDA and has been authorized for detection and/or diagnosis of SARS-CoV-2 by FDA under an Emergency Use Authorization (EUA).  This EUA will remain in effect (meaning this test can be used) for the duration of the COVID-19 declaration under Section 564(b)(1) of the Act, 21 U.S.C. section 360bbb-3(b)(1), unless the authorization is terminated or revoked sooner.  Performed at [facility], [place]  [address] [place], [place] [address]      Labs: BNP (last 3 results) No results for input(s): BNP in the last 8760 hours. Basic Metabolic Panel: Recent Labs  Lab 06/16/20 1517 06/17/20 1157 06/18/20 0355 06/19/20 0337  NA 134* 131* 137 137  K 4.5 4.1 3.6 3.5  CL 97* 96* 103 102  CO2 26 25 23 24   GLUCOSE 60* 78 48* 77  BUN 18 18 12 6   CREATININE 0.67 0.71 0.53 0.47  CALCIUM 9.8 8.9 8.1* 8.5*   Liver Function Tests: Recent Labs  Lab 06/16/20 1517 06/17/20 1157 06/18/20 0355  AST 20 20 17   ALT 10 11 10   ALKPHOS  --  58 54  BILITOT 0.6 0.6 0.7  PROT 6.9 6.8 5.7*  ALBUMIN  --  3.4* 2.8*   Recent Labs  Lab 06/16/20 1517 06/17/20 1157 06/19/20 0337  LIPASE 2,840* 157* 129*   No results for input(s): AMMONIA in the last 168 hours. CBC: Recent Labs  Lab 06/16/20 1517 06/17/20 1157 06/18/20 0355  WBC 14.2* 11.6* 8.6  NEUTROABS 10,735*  --   --   HGB 15.7* 13.5 12.4  HCT 46.1* 40.0 38.1  MCV 93.7 95.0 98.4  PLT 381 287 242   Cardiac Enzymes: No results for input(s): CKTOTAL, CKMB, CKMBINDEX, TROPONINI in the last 168 hours. BNP: Invalid input(s): POCBNP CBG: Recent Labs  Lab 06/19/20 0548  GLUCAP 75   D-Dimer No results for input(s): DDIMER in the last 72 hours. Hgb A1c No results for input(s): HGBA1C in the last 72 hours. Lipid Profile No results for input(s): CHOL, HDL, LDLCALC, TRIG, CHOLHDL, LDLDIRECT in the last 72 hours. Thyroid function studies No results for input(s): TSH, T4TOTAL, T3FREE, THYROIDAB in the last 72 hours.  Invalid input(s): FREET3 Anemia work up No results for input(s): VITAMINB12, FOLATE, FERRITIN, TIBC, IRON, RETICCTPCT in the last 72 hours. Urinalysis    Component Value Date/Time   COLORURINE YELLOW 06/16/2020 1452   APPEARANCEUR CLOUDY (Stephenson) 06/16/2020 1452   LABSPEC 1.015 06/16/2020 1452   PHURINE 5.5 06/16/2020 1452   GLUCOSEU NEGATIVE 06/16/2020 1452   HGBUR 3+ (Stephenson) 06/16/2020 1452   BILIRUBINUR neg 06/16/2020  1445   KETONESUR 2+ (Stephenson) 06/16/2020 1452   PROTEINUR 1+ (Stephenson) 06/16/2020 1452  PROTEINUR Positive (Stephenson) 06/16/2020 1445   UROBILINOGEN 0.2 06/16/2020 1445   NITRITE neg 06/16/2020 1445   NITRITE NEGATIVE 03/17/2020 0948   LEUKOCYTESUR Trace (Stephenson) 06/16/2020 1445   LEUKOCYTESUR NEGATIVE 03/17/2020 0948   Sepsis Labs Invalid input(s): PROCALCITONIN,  WBC,  LACTICIDVEN Microbiology Recent Results (from the past 240 hour(s))  Urine Culture     Status: Abnormal   Collection Time: 06/16/20  2:52 PM  Result Value Ref Range Status   MICRO NUMBER: [id number]  Final   SPECIMEN QUALITY: Adequate  Final   Sample Source URINE  Final   STATUS: FINAL  Final   ISOLATE 1: Escherichia coli (Stephenson)  Final    Comment: 50,000-100,000 CFU/mL of Escherichia coli      Susceptibility   Escherichia coli - URINE CULTURE, REFLEX    AMOX/CLAVULANIC 4 Sensitive     AMPICILLIN <=2 Sensitive     AMPICILLIN/SULBACTAM <=2 Sensitive     CEFAZOLIN* <=4 Not Reportable      * For infections other than uncomplicated UTIcaused by E. coli, K. pneumoniae or P. mirabilis:Cefazolin is resistant if MIC > or = 8 mcg/mL.(Distinguishing susceptible versus intermediatefor isolates with MIC < or = 4 mcg/mL requiresadditional testing.)For uncomplicated UTI caused by E. coli,K. pneumoniae or P. mirabilis: Cefazolin issusceptible if MIC <32 mcg/mL and predictssusceptible to the oral agents cefaclor, cefdinir,cefpodoxime, cefprozil, cefuroxime, cephalexinand loracarbef.    CEFEPIME <=1 Sensitive     CEFTRIAXONE <=1 Sensitive     CIPROFLOXACIN <=0.25 Sensitive     LEVOFLOXACIN <=0.12 Sensitive     ERTAPENEM <=0.5 Sensitive     GENTAMICIN <=1 Sensitive     IMIPENEM <=0.25 Sensitive     NITROFURANTOIN <=16 Sensitive     PIP/TAZO <=4 Sensitive     TOBRAMYCIN <=1 Sensitive     TRIMETH/SULFA* <=20 Sensitive      * For infections other than uncomplicated UTIcaused by E. coli, K. pneumoniae or P. mirabilis:Cefazolin is resistant if MIC > or = 8  mcg/mL.(Distinguishing susceptible versus intermediatefor isolates with MIC < or = 4 mcg/mL requiresadditional testing.)For uncomplicated UTI caused by E. coli,K. pneumoniae or P. mirabilis: Cefazolin issusceptible if MIC <32 mcg/mL and predictssusceptible to the oral agents cefaclor, cefdinir,cefpodoxime, cefprozil, cefuroxime, cephalexinand loracarbef.Legend:S = Susceptible  I = IntermediateR = Resistant  NS = Not susceptible* = Not tested  NR = Not reported**NN = See antimicrobic comments  SARS Coronavirus 2 by RT PCR (hospital order, performed in [place] hospital lab) Nasopharyngeal Nasopharyngeal Swab     Status: None   Collection Time: 06/17/20 11:57 AM   Specimen: Nasopharyngeal Swab  Result Value Ref Range Status   SARS Coronavirus 2 NEGATIVE NEGATIVE Final    Comment: (NOTE) SARS-CoV-2 target nucleic acids are NOT DETECTED.  The SARS-CoV-2 RNA is generally detectable in upper and lower respiratory specimens during the acute phase of infection. The lowest concentration of SARS-CoV-2 viral copies this assay can detect is 250 copies / mL. Stephenson negative result does not preclude SARS-CoV-2 infection and should not be used as the sole basis for treatment or other patient management decisions.  Stephenson negative result may occur with improper specimen collection / handling, submission of specimen other than nasopharyngeal swab, presence of viral mutation(s) within the areas targeted by this assay, and inadequate number of viral copies (<250 copies / mL). Stephenson negative result must be combined with clinical observations, patient history, and epidemiological information.  Fact Sheet for Patients:   StrictlyIdeas.no  Fact Sheet for Healthcare Providers: BankingDealers.co.za  This test  is not yet approved or  cleared by the [place] and has been authorized for detection and/or diagnosis of SARS-CoV-2 by FDA under an Emergency Use Authorization  (EUA).  This EUA will remain in effect (meaning this test can be used) for the duration of the COVID-19 declaration under Section 564(b)(1) of the Act, 21 U.S.C. section 360bbb-3(b)(1), unless the authorization is terminated or revoked sooner.  Performed at [facility], [place] [address], [place], [place] [address]      Time coordinating discharge: 36 minutes.  SIGNED:   Hosie Poisson, MD  [facility]

## 2020-06-23 ENCOUNTER — Telehealth: Payer: Self-pay

## 2020-06-23 NOTE — Telephone Encounter (Signed)
Transition Care Management Unsuccessful Follow-up Telephone Call  Date of discharge and from where:  06/20/2020; [facility]  Attempts:  1st Attempt  Reason for unsuccessful TCM follow-up call:  No answer/busy

## 2020-06-24 NOTE — Telephone Encounter (Signed)
Transition Care Management Unsuccessful Follow-up Telephone Call  Date of discharge and from where:  06/20/2020; [facility]  Attempts:  2nd Attempt  Reason for unsuccessful TCM follow-up call:  No answer/busy

## 2020-06-29 ENCOUNTER — Other Ambulatory Visit: Payer: Self-pay

## 2020-06-29 ENCOUNTER — Encounter: Payer: Self-pay | Admitting: Physical Medicine & Rehabilitation

## 2020-06-29 ENCOUNTER — Encounter: Payer: 59 | Attending: Physical Medicine & Rehabilitation | Admitting: Physical Medicine & Rehabilitation

## 2020-06-29 VITALS — BP 113/69 | HR 81 | Ht 61.0 in | Wt 107.0 lb

## 2020-06-29 DIAGNOSIS — G5602 Carpal tunnel syndrome, left upper limb: Secondary | ICD-10-CM | POA: Diagnosis present

## 2020-06-29 DIAGNOSIS — R269 Unspecified abnormalities of gait and mobility: Secondary | ICD-10-CM | POA: Diagnosis present

## 2020-06-29 DIAGNOSIS — R200 Anesthesia of skin: Secondary | ICD-10-CM | POA: Diagnosis present

## 2020-06-29 DIAGNOSIS — M170 Bilateral primary osteoarthritis of knee: Secondary | ICD-10-CM | POA: Diagnosis present

## 2020-06-29 DIAGNOSIS — G8110 Spastic hemiplegia affecting unspecified side: Secondary | ICD-10-CM | POA: Insufficient documentation

## 2020-06-29 NOTE — Progress Notes (Signed)
Ultrasound guided intraarticular Bilateral knee injection   Indication: Knee pain not relieved by medication management and other conservative care.   Informed consent was obtained after describing risks and benefits of the procedure with the patient, this includes bleeding, bruising, infection and medication side effects. The patient wishes to proceed and has given written consent. Patient was placed in decubitus position with the bilateral knees slightly flexed. Bilateral knees were marked and prepped with betadine superior to the TF tendon. The ultrasound tranducer was placed in long axis at the patellafemoral junction, visualizing the suprapatellar bursa.  The transducer was then changed to short axis view, keeping the suprapatellar bursa in view. Vapocoolant spray was applied.  A 22-gauge 2 inch echobloc needle was inserted into the suprapatellar bursa x2. After negative draw back for blood, a solution 6cc of Synvisc One solution was injected. A band aid was applied. The patient tolerated the procedure well. PROM and AROM was performed on the knee.  Post procedure instructions were given to refrain from excessive activity to the knee for 48 hours.

## 2020-06-30 ENCOUNTER — Telehealth: Payer: Self-pay | Admitting: Gastroenterology

## 2020-06-30 NOTE — Telephone Encounter (Signed)
Pt is wanting to inform the nurse that [facility] has not received a referral for her since Dr Bryan Lemma referred her to them.  Pt would like a referral sent to them please.

## 2020-06-30 NOTE — Telephone Encounter (Signed)
Spoke to the referral coordinator at [facility] who reports the referral is complete, and they will be contacting the patient for an appointment very soon. Patient notified of this information.

## 2020-07-03 ENCOUNTER — Telehealth: Payer: Self-pay | Admitting: Gastroenterology

## 2020-07-03 NOTE — Telephone Encounter (Signed)
Pls call pt, she said that she had a pancreatic attack last night and her stomach is still very sore, she would like to speak with you.Pt would also want to speak with you about imaging test that she is scheduled next week.

## 2020-07-03 NOTE — Telephone Encounter (Signed)
Sorry to hear that she had another episode of abdominal pain.  Thank you for coordinating her referral to [facility] [place].  Agree with plan for conservative management and if increasing symptoms, inability tolerate p.o. intake, or other concerns, agree with plan to go to the ER as she outlined.

## 2020-07-03 NOTE — Telephone Encounter (Signed)
Spoke to patient this morning who states that she had another "pancreatic attack" during the night. She had upper abdominal cramping and vomited once. She took her prescription Tramadol 50 mg for her symptoms and felt better. She has started herself on a liquid diet for today until her symptoms calm down. I called the [place] [facility] again to inquire about the referral that was placed late September.An appointment is scheduled for 07/14/20.With Arron [employer] PA. They will set her up with one of the Doctors in the Pancreatic clinic. I spoke with patient this afternoon to inform her of this appointment. She states that she is feeling better,and maintaining a clear liquid diet. No fever or further vomiting. She will go to the ED for worsening symptoms. Patient stated she will probable go to [facility] ED for ongoing pancreatic attacks if she can make the drive.

## 2020-07-04 LAB — CUP PACEART REMOTE DEVICE CHECK
Date Time Interrogation Session: 20211008233755
Implantable Pulse Generator Implant Date: 20181203

## 2020-07-06 ENCOUNTER — Telehealth: Payer: Self-pay

## 2020-07-06 NOTE — Telephone Encounter (Signed)
Patient is seeing a GI specialist at [facility].

## 2020-07-06 NOTE — Telephone Encounter (Signed)
FYI

## 2020-07-07 ENCOUNTER — Other Ambulatory Visit: Payer: Self-pay | Admitting: Gastroenterology

## 2020-07-07 ENCOUNTER — Other Ambulatory Visit: Payer: Self-pay

## 2020-07-07 ENCOUNTER — Ambulatory Visit (HOSPITAL_COMMUNITY)
Admission: RE | Admit: 2020-07-07 | Discharge: 2020-07-07 | Disposition: A | Discharge status: Home / Self Care | Payer: 59 | Source: Ambulatory Visit | Attending: Gastroenterology | Admitting: Gastroenterology

## 2020-07-07 DIAGNOSIS — K859 Acute pancreatitis without necrosis or infection, unspecified: Secondary | ICD-10-CM

## 2020-07-07 MED ORDER — GADOBUTROL 1 MMOL/ML IV SOLN
5.0000 mL | Freq: Once | INTRAVENOUS | Status: AC | PRN
  Administered 2020-07-07: 5 mL via INTRAVENOUS

## 2020-07-13 ENCOUNTER — Ambulatory Visit: Payer: 59 | Admitting: Physical Medicine & Rehabilitation

## 2020-07-13 ENCOUNTER — Encounter: Payer: Self-pay | Admitting: Physical Medicine & Rehabilitation

## 2020-07-13 ENCOUNTER — Encounter (HOSPITAL_BASED_OUTPATIENT_CLINIC_OR_DEPARTMENT_OTHER): Payer: 59 | Admitting: Physical Medicine & Rehabilitation

## 2020-07-13 ENCOUNTER — Other Ambulatory Visit: Payer: Self-pay

## 2020-07-13 ENCOUNTER — Ambulatory Visit (INDEPENDENT_AMBULATORY_CARE_PROVIDER_SITE_OTHER): Payer: 59

## 2020-07-13 VITALS — BP 130/76 | HR 85 | Temp 98.2°F | Ht 61.0 in | Wt 105.6 lb

## 2020-07-13 DIAGNOSIS — R269 Unspecified abnormalities of gait and mobility: Secondary | ICD-10-CM

## 2020-07-13 DIAGNOSIS — I63512 Cerebral infarction due to unspecified occlusion or stenosis of left middle cerebral artery: Secondary | ICD-10-CM

## 2020-07-13 DIAGNOSIS — G5602 Carpal tunnel syndrome, left upper limb: Secondary | ICD-10-CM

## 2020-07-13 DIAGNOSIS — G8110 Spastic hemiplegia affecting unspecified side: Secondary | ICD-10-CM | POA: Diagnosis not present

## 2020-07-13 DIAGNOSIS — R200 Anesthesia of skin: Secondary | ICD-10-CM

## 2020-07-13 DIAGNOSIS — M170 Bilateral primary osteoarthritis of knee: Secondary | ICD-10-CM

## 2020-07-13 NOTE — Progress Notes (Signed)
Subjective:    Patient ID: [name], female    DOB: [birth year], 59 y.o.   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    Last clinic visit 06/29/20.  Patient had b/l Synvisc injections at that time.  Since that time, pt is scheduled to go to [facility], notes reviewed, for pancreatitis. She states she had benefit with right knee, but limited benefit with left knee. She is wearing her orthosis occasionally. She is taking Baclofen. She has not tried Voltaren on her wrist.  Rash is fair.   Pain Inventory Average Pain 6 Pain Right Now 6 My pain is constant, burning, dull, stabbing and tingling  In the last 24 hours, has pain interfered with the following? General activity 6 Relation with others 6 Enjoyment of life 6 What TIME of day is your pain at its worst? morning Sleep (in general) Fair  Pain is worse with: walking and standing Pain improves with: medication Relief from Meds: 5    Family History  Problem Relation Age of Onset  . Heart attack Father   . Hypertension Mother   . Hyperlipidemia Mother   . Diabetes type II Mother   . Urticaria Mother   . Hypertension Brother   . Breast cancer Cousin 45  . Immunodeficiency Neg Hx   . Eczema Neg Hx   . Atopy Neg Hx   . Asthma Neg Hx   . Angioedema Neg Hx   . Allergic rhinitis Neg Hx   . Colon cancer Neg Hx   . Esophageal cancer Neg Hx   . Stomach cancer Neg Hx   . Pancreatic cancer Neg Hx   . Colon polyps Neg Hx   . Esophageal varices Neg Hx   . Inflammatory bowel disease Neg Hx   . Rectal cancer Neg Hx    Social History   Socioeconomic History  . Marital status: Married    Spouse name: Not on file  . Number of children: 2  . Years of education: Not on file  . Highest education level: Not on file  Occupational History   . Not on file  Tobacco Use  . Smoking status: Current Some Day Smoker    Packs/day: 0.10    Years: 37.00    Pack years: 3.70    Types: Cigarettes  . Smokeless tobacco: Never Used  Vaping Use  . Vaping Use: Never used  Substance and Sexual Activity  . Alcohol use: No  . Drug use: No  . Sexual activity: Yes    Partners: Male  Other Topics Concern  . Not on file  Social History Narrative   Married. 2 children.    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    Walks with cane or wheelchair.    Former smoker.    Drinks caffeine.    Smoke alarm in the home.   Firearms in the home.    Wears her seatbelt.    Feels safe In her relationships.    Social Determinants of Health   Financial Resource Strain:   . Difficulty of Paying Living Expenses: Not on file  Food Insecurity:   . Worried About Charity fundraiser in the Last Year: Not on file  . Ran Out of Food in the Last Year: Not on file  Transportation Needs:   . Lack of Transportation (  Medical): Not on file  . Lack of Transportation (Non-Medical): Not on file  Physical Activity:   . Days of Exercise per Week: Not on file  . Minutes of Exercise per Session: Not on file  Stress:   . Feeling of Stress : Not on file  Social Connections:   . Frequency of Communication with Friends and Family: Not on file  . Frequency of Social Gatherings with Friends and Family: Not on file  . Attends Religious Services: Not on file  . Active Member of Clubs or Organizations: Not on file  . Attends Archivist Meetings: Not on file  . Marital Status: Not on file   Past Surgical History:  Procedure Laterality Date  . BIOPSY  12/02/2019   Procedure: BIOPSY;  Surgeon: Rush Landmark Telford Nab., MD;  Location: [facility];  Service: Gastroenterology;;  . CHOLECYSTECTOMY N/A 04/30/2020   Procedure: LAPAROSCOPIC CHOLECYSTECTOMY WITH INTRAOPERATIVE CHOLANGIOGRAM;  Surgeon: Armandina Gemma, MD;  Location: [facility];  Service: General;  Laterality: N/A;  .  CORONARY ARTERY BYPASS GRAFT N/A 11/23/2017   Procedure: CORONARY ARTERY BYPASS GRAFTING (CABG) x 2, ON PUMP, USING LEFT INTERNAL MAMMARY ARTERY AND RIGHT GREATER SAPHENOUS VEIN HARVESTED ENDOSCOPICALLY;  Surgeon: Gaye Pollack, MD;  Location: [place];  Service: Open Heart Surgery;  Laterality: N/A;  . CRYOABLATION     cervical  . ESOPHAGOGASTRODUODENOSCOPY (EGD) WITH PROPOFOL N/A 12/02/2019   Procedure: ESOPHAGOGASTRODUODENOSCOPY (EGD) WITH PROPOFOL;  Surgeon: Rush Landmark Telford Nab., MD;  Location: [place];  Service: Gastroenterology;  Laterality: N/A;  . ESOPHAGOGASTRODUODENOSCOPY (EGD) WITH PROPOFOL N/A 03/19/2020   Procedure: ESOPHAGOGASTRODUODENOSCOPY (EGD) WITH PROPOFOL;  Surgeon: Milus Banister, MD;  [facility];  Service: Endoscopy;  Laterality: N/A;  . EUS N/A 03/19/2020   Procedure: UPPER ENDOSCOPIC ULTRASOUND (EUS) RADIAL;  Surgeon: Milus Banister, MD;  [facility];  Service: Endoscopy;  Laterality: N/A;  . EUS N/A 03/19/2020   Procedure: UPPER ENDOSCOPIC ULTRASOUND (EUS) LINEAR;  Surgeon: Milus Banister, MD;  [facility];  Service: Endoscopy;  Laterality: N/A;  . FINE NEEDLE ASPIRATION  12/02/2019   Procedure: FINE NEEDLE ASPIRATION (FNA) LINEAR;  Surgeon: Irving Copas., MD;  Location: [place];  Service: Gastroenterology;;  . FINE NEEDLE ASPIRATION N/A 03/19/2020   Procedure: FINE NEEDLE ASPIRATION (FNA) LINEAR;  Surgeon: Milus Banister, MD;  [facility];  Service: Endoscopy;  Laterality: N/A;  . IR ANGIO INTRA EXTRACRAN SEL COM CAROTID INNOMINATE UNI R MOD SED  08/21/2017  . IR ANGIO VERTEBRAL SEL SUBCLAVIAN INNOMINATE UNI R MOD SED  08/21/2017  . IR PERCUTANEOUS ART THROMBECTOMY/INFUSION INTRACRANIAL INC DIAG ANGIO  08/21/2017  . IR RADIOLOGIST EVAL & MGMT  10/30/2017  . LOOP RECORDER INSERTION N/A 08/28/2017   Procedure: LOOP RECORDER INSERTION;  Surgeon: Constance Haw, MD;  Location: [facility];  Service:  Cardiovascular;  Laterality: N/A;  . RADIOLOGY WITH ANESTHESIA N/A 08/21/2017   Procedure: RADIOLOGY WITH ANESTHESIA;  Surgeon: Luanne Bras, MD;  Location: [place];  Service: Radiology;  Laterality: N/A;  . RIGHT/LEFT HEART CATH AND CORONARY ANGIOGRAPHY N/A 11/20/2017   Procedure: RIGHT/LEFT HEART CATH AND CORONARY ANGIOGRAPHY;  Surgeon: Jettie Booze, MD;  Location: [facility];  Service: Cardiovascular;  Laterality: N/A;  . TEE WITHOUT CARDIOVERSION N/A 08/28/2017   Procedure: TRANSESOPHAGEAL ECHOCARDIOGRAM (TEE);  Surgeon: Fay Records, MD;  Location: [place];  Service: Cardiovascular;  Laterality: N/A;  . TEE WITHOUT CARDIOVERSION N/A 11/23/2017   Procedure: TRANSESOPHAGEAL ECHOCARDIOGRAM (  TEE);  Surgeon: Gaye Pollack, MD;  Location: [facility];  Service: Open Heart Surgery;  Laterality: N/A;  . TUBAL LIGATION    . UPPER ESOPHAGEAL ENDOSCOPIC ULTRASOUND (EUS) N/A 12/02/2019   Procedure: UPPER ESOPHAGEAL ENDOSCOPIC ULTRASOUND (EUS);  Surgeon: Irving Copas., MD;  Location: [facility];  Service: Gastroenterology;  Laterality: N/A;   Past Medical History:  Diagnosis Date  . AAA (abdominal aortic aneurysm) ([place])    a. 3.3cm infrarenal by CT 02/2020.  . Angio-edema   . Arthritis   . CAD (coronary artery disease)    a. s/p NSTEMI in 10/2017 with cath showing LM disease --> s/p CABG on 11/23/2017 with LIMA-LAD and SVG-OM.   [name] Carotid stenosis    a. Duplex 11/2018 8-41% RICA, 66-06% LICA.  [name] Gastritis and duodenitis 12/06/2019   11/2019 Peptic duodenitis  - No dysplasia or malignancy identified  B. STOMACH, BIOPSY:  - Mild chronic gastritis with intestinal metaplasia   . Hay fever   . History of kidney stones   . Ischemic cardiomyopathy    a. EF 35-40% by echo in 10/2017. b. 55% by echo 01/2020.  . NSTEMI (non-ST elevated myocardial infarction) ([place])   . PAF (paroxysmal atrial fibrillation) ([place])    Diagnosed by ILR  . Pancreatitis   . Pilonidal cyst 10/21/2019   . Pneumonia 11/16/2017  . Stroke [facility]) 08/23/2017   Left MCA infarct status post TPA and thrombectomy  . Urticaria    BP 130/76   Pulse 85   Temp 98.2 F (36.8 C)   Wt 105 lb 9.6 oz (47.9 kg)   LMP  (LMP Unknown) Comment: at age 27  SpO2 95%   BMI 19.95 kg/m   Opioid Risk Score:   Fall Risk Score:  `1  Depression screen PHQ 2/9  Depression screen [facility] 2/9 07/13/2020 05/05/2020 11/22/2018 10/22/2018 10/22/2018 10/19/2018  Decreased Interest 0 0 0 0 0 0  Down, Depressed, Hopeless 0 0 0 0 0 0  PHQ - 2 Score 0 0 0 0 0 0  Altered sleeping - - - 0 - -  Tired, decreased energy - - - 1 - -  Change in appetite - - - 0 - -  Feeling bad or failure about yourself  - - - 0 - -  Trouble concentrating - - - 0 - -  Moving slowly or fidgety/restless - - - 0 - -  Suicidal thoughts - - - 0 - -  PHQ-9 Score - - - 1 - -  Difficult doing work/chores - - - Not difficult at all - -  Some recent data might be hidden   Review of Systems  Constitutional: Negative.   HENT: Negative.   Eyes: Negative.   Respiratory: Negative.   Gastrointestinal: Negative.   Endocrine: Negative.   Genitourinary: Negative.   Musculoskeletal: Positive for arthralgias and gait problem.  Skin: Positive for rash.  Allergic/Immunologic: Negative.   Neurological: Positive for weakness and numbness.  Hematological: Negative.   Psychiatric/Behavioral: Negative.   All other systems reviewed and are negative.     Objective:   Physical Exam  Constitutional: No distress . Vital signs reviewed. HENT: Normocephalic.  Atraumatic. Eyes: EOMI. No discharge. Cardiovascular: No JVD.   Respiratory: Normal effort.  No stridor.   GI: Non-distended.   Skin: Warm and dry.  Intact. Psych: Normal mood.  Normal behavior. Musc: No edema in extremities.  No tenderness in extremities. 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  1. 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 Status post loop recorder  Encouraged PRAFO/WHO/AFO  Cont Fluoxetine  She followed up with Vascular, no further intervention at present  Continue HEP  2. Spastic hemiplegia  Continue Baclofen to 10 TID (typically takes BID), good benefit  Will hold off on Botox at this time  3. Neurologic gait abnormality  Foot drag improved, improved with cognizant ambulation  No longer requiring quad cane  Does not need AFO anymore  Continue HEP  4. Bilateral knee OA  B/l Zilretta injections performed on 03/23/18 - with good benefit  Steroid injection on 5/28 with benefit, minimal benefit in 10/2019  Synvisc b/l on 10/05/2018 with benefit, repeat injection on 06/29/20 with benefit in right knee, limited benefit in left knee  Encouraged trial Lidoderm patch, reminded againx5  Xrays reviewed, medial > lateral OA b/l  Continue Voltaren gel   Will likely require referral to Ortho  5. Left CTS  Allergic to Gabapentin  Will consider Pregabalin after Allergy appointment  NCS/EMG showing L>R CTS  Cont bracing   Trial of Voltaren gel on wrist, reminded again  Will avoid steroid injection due to risk of exacerbation of pancreatitis  6. Rash  Continue to follow up with Allergy

## 2020-07-14 ENCOUNTER — Encounter: Payer: Self-pay | Admitting: Physical Medicine & Rehabilitation

## 2020-07-15 ENCOUNTER — Encounter: Payer: Self-pay | Admitting: Internal Medicine

## 2020-07-15 ENCOUNTER — Ambulatory Visit (INDEPENDENT_AMBULATORY_CARE_PROVIDER_SITE_OTHER): Payer: 59 | Admitting: Internal Medicine

## 2020-07-15 ENCOUNTER — Other Ambulatory Visit: Payer: Self-pay

## 2020-07-15 VITALS — BP 94/64 | HR 84 | Ht 61.0 in | Wt 104.4 lb

## 2020-07-15 DIAGNOSIS — E063 Autoimmune thyroiditis: Secondary | ICD-10-CM | POA: Diagnosis not present

## 2020-07-15 LAB — T4, FREE: Free T4: 0.73 ng/dL (ref 0.60–1.60)

## 2020-07-15 LAB — T3, FREE: T3, Free: 3.4 pg/mL (ref 2.3–4.2)

## 2020-07-15 LAB — TSH: TSH: 2.22 u[IU]/mL (ref 0.35–4.50)

## 2020-07-15 NOTE — Patient Instructions (Addendum)
Please stop at the lab.  In case we need to start Levothyroxine, take the thyroid hormone every day, with water, at least 30 minutes before breakfast, separated by at least 4 hours from: - acid reflux medications - calcium - iron - multivitamins  Please come back for a follow-up appointment in 6 months.

## 2020-07-15 NOTE — Progress Notes (Signed)
Patient ID: [name], female   DOB: Apr 03, 1961, 59 y.o.   MRN: [id number]   This visit occurred during the SARS-CoV-2 public health emergency.  Safety protocols were in place, including screening questions prior to the visit, additional usage of staff PPE, and extensive cleaning of exam room while observing appropriate contact time as indicated for disinfecting solutions.   HPI  [name] is a 59 y.o.-year-old female, initially referred by her PCP, Dr. Raoul Pitch, for management of Hashimoto's thyroiditis.  Last visit 6 months ago.  Patient has a history of Hashimoto's thyroiditis diagnosed in 08/2020 during investigation of her hives.  She did not require levothyroxine yet.  Pt. also has a history of recurrent urticaria: abdomen, back, groin, hands (started in 2018) >> almost entirely resolved.  She also has a history of idiopathic recurrent pancreatitis and also gastritis.  She had a significant weight loss of 16 pounds before last visit, and another 5 pounds since then.  She had extensive investigation for this without any cause found.  However, in 11/2019, [name] was considered a possible culprit and this was stopped.  She had cholecystectomy 04/2020. He did have another episode of pancreatitis last month. She will have an ERCP at [facility] next month.  She has solid food once a day, OTW liquid diet.   Reviewed her TFTs: Lab Results  Component Value Date   TSH 4.58 (H) 01/14/2020   TSH 2.56 10/21/2019   TSH 1.98 02/02/2018   TSH 1.086 08/22/2017   FREET4 0.91 01/14/2020   FREET4 0.82 10/21/2019   FREET4 0.78 02/02/2018    Her antithyroid antibodies were elevated: Component     Latest Ref Rng & Units 01/14/2020  Thyroperoxidase Ab SerPl-aCnc     <9 IU/mL 661 (H)  Thyroglobulin Ab     < or = 1 IU/mL 39 (H)   Component     Latest Ref Rng & Units 09/24/2019  Thyroperoxidase Ab SerPl-aCnc     0 - 34 IU/mL 349 (H)  Thyroglobulin Antibody     0.0 - 0.9 IU/mL 52.8 (H)   She  did not start Selenium as suggested after last OV as she forgot.  Pt denies: - feeling nodules in neck - hoarseness - dysphagia - choking - SOB with lying down  She has + FH of thyroid disorders in: M - postsurgical hypothyroidectomy.  No family history of thyroid cancer. No history of radiation to head or neck.  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 No recent use of iodine supplements.  No herbal supplements. + steroid inj in both knees 1 weeks ago She takes iron, vitamin B12 and vitamin D.  She also has a history of ischemic cardiomyopathy, s/p AMI, PAF, history of stroke. She also has a history of angioedema (to Lopressor).    ROS: Constitutional: no weight gain/+ weight loss, no fatigue, no subjective hyperthermia, no subjective hypothermia Eyes: no blurry vision, no xerophthalmia ENT: no sore throat, + see HPI Cardiovascular: no CP/no SOB/no palpitations/no leg swelling Respiratory: no cough/no SOB/no wheezing Gastrointestinal: no N/no V/no D/no C/no acid reflux, + abdominal pain Musculoskeletal: no muscle aches/+ joint aches Skin: + Rash (hives) - 1 outbreak >> resolved, no hair loss Neurological: no tremors/no numbness/no tingling/no dizziness  I reviewed pt's medications, allergies, PMH, social hx, family hx, and changes were documented in the history of present illness. Otherwise, unchanged from my initial visit note.  Past Medical History:  Diagnosis Date  . AAA (abdominal aortic aneurysm) ([place])    a.  3.3cm infrarenal by CT 02/2020.  . Angio-edema   . Arthritis   . CAD (coronary artery disease)    a. s/p NSTEMI in 10/2017 with cath showing LM disease --> s/p CABG on 11/23/2017 with LIMA-LAD and SVG-OM.   [name] Carotid stenosis    a. Duplex 11/2018 6-78% RICA, 93-81% LICA.  [name] Gastritis and duodenitis 12/06/2019   11/2019 Peptic duodenitis  - No dysplasia or malignancy identified  B. STOMACH, BIOPSY:  - Mild chronic gastritis with intestinal metaplasia   . Hay fever   . History of  kidney stones   . Ischemic cardiomyopathy    a. EF 35-40% by echo in 10/2017. b. 55% by echo 01/2020.  . NSTEMI (non-ST elevated myocardial infarction) ([place])   . PAF (paroxysmal atrial fibrillation) ([place])    Diagnosed by ILR  . Pancreatitis   . Pilonidal cyst 10/21/2019  . Pneumonia 11/16/2017  . Stroke [facility]) 08/23/2017   Left MCA infarct status post TPA and thrombectomy  . Urticaria    Past Surgical History:  Procedure Laterality Date  . BIOPSY  12/02/2019   Procedure: BIOPSY;  Surgeon: Rush Landmark Telford Nab., MD;  Location: [place];  Service: Gastroenterology;;  . CHOLECYSTECTOMY N/A 04/30/2020   Procedure: LAPAROSCOPIC CHOLECYSTECTOMY WITH INTRAOPERATIVE CHOLANGIOGRAM;  Surgeon: Armandina Gemma, MD;  Location: [facility];  Service: General;  Laterality: N/A;  . CORONARY ARTERY BYPASS GRAFT N/A 11/23/2017   Procedure: CORONARY ARTERY BYPASS GRAFTING (CABG) x 2, ON PUMP, USING LEFT INTERNAL MAMMARY ARTERY AND RIGHT GREATER SAPHENOUS VEIN HARVESTED ENDOSCOPICALLY;  Surgeon: Gaye Pollack, MD;  Location: [place];  Service: Open Heart Surgery;  Laterality: N/A;  . CRYOABLATION     cervical  . ESOPHAGOGASTRODUODENOSCOPY (EGD) WITH PROPOFOL N/A 12/02/2019   Procedure: ESOPHAGOGASTRODUODENOSCOPY (EGD) WITH PROPOFOL;  Surgeon: Rush Landmark Telford Nab., MD;  Location: [place];  Service: Gastroenterology;  Laterality: N/A;  . ESOPHAGOGASTRODUODENOSCOPY (EGD) WITH PROPOFOL N/A 03/19/2020   Procedure: ESOPHAGOGASTRODUODENOSCOPY (EGD) WITH PROPOFOL;  Surgeon: Milus Banister, MD;  [facility];  Service: Endoscopy;  Laterality: N/A;  . EUS N/A 03/19/2020   Procedure: UPPER ENDOSCOPIC ULTRASOUND (EUS) RADIAL;  Surgeon: Milus Banister, MD;  [facility];  Service: Endoscopy;  Laterality: N/A;  . EUS N/A 03/19/2020   Procedure: UPPER ENDOSCOPIC ULTRASOUND (EUS) LINEAR;  Surgeon: Milus Banister, MD;  [facility];  Service: Endoscopy;  Laterality: N/A;  . FINE NEEDLE  ASPIRATION  12/02/2019   Procedure: FINE NEEDLE ASPIRATION (FNA) LINEAR;  Surgeon: Irving Copas., MD;  Location: [place];  Service: Gastroenterology;;  . FINE NEEDLE ASPIRATION N/A 03/19/2020   Procedure: FINE NEEDLE ASPIRATION (FNA) LINEAR;  Surgeon: Milus Banister, MD;  [facility];  Service: Endoscopy;  Laterality: N/A;  . IR ANGIO INTRA EXTRACRAN SEL COM CAROTID INNOMINATE UNI R MOD SED  08/21/2017  . IR ANGIO VERTEBRAL SEL SUBCLAVIAN INNOMINATE UNI R MOD SED  08/21/2017  . IR PERCUTANEOUS ART THROMBECTOMY/INFUSION INTRACRANIAL INC DIAG ANGIO  08/21/2017  . IR RADIOLOGIST EVAL & MGMT  10/30/2017  . LOOP RECORDER INSERTION N/A 08/28/2017   Procedure: LOOP RECORDER INSERTION;  Surgeon: Constance Haw, MD;  Location: [place] [facility];  Service: Cardiovascular;  Laterality: N/A;  . RADIOLOGY WITH ANESTHESIA N/A 08/21/2017   Procedure: RADIOLOGY WITH ANESTHESIA;  Surgeon: Luanne Bras, MD;  Location: [place];  Service: Radiology;  Laterality: N/A;  . RIGHT/LEFT HEART CATH AND CORONARY ANGIOGRAPHY N/A 11/20/2017   Procedure: RIGHT/LEFT HEART CATH AND CORONARY ANGIOGRAPHY;  Surgeon: Jettie Booze, MD;  Location: [place] [facility];  Service: Cardiovascular;  Laterality: N/A;  . TEE WITHOUT CARDIOVERSION N/A 08/28/2017   Procedure: TRANSESOPHAGEAL ECHOCARDIOGRAM (TEE);  Surgeon: Fay Records, MD;  Location: [place];  Service: Cardiovascular;  Laterality: N/A;  . TEE WITHOUT CARDIOVERSION N/A 11/23/2017   Procedure: TRANSESOPHAGEAL ECHOCARDIOGRAM (TEE);  Surgeon: Gaye Pollack, MD;  Location: [place];  Service: Open Heart Surgery;  Laterality: N/A;  . TUBAL LIGATION    . UPPER ESOPHAGEAL ENDOSCOPIC ULTRASOUND (EUS) N/A 12/02/2019   Procedure: UPPER ESOPHAGEAL ENDOSCOPIC ULTRASOUND (EUS);  Surgeon: Irving Copas., MD;  Location: [place];  Service: Gastroenterology;  Laterality: N/A;   Social History   Socioeconomic History  . Marital status:  Married    Spouse name: Not on file  . Number of children: 2  . Years of education: Not on file  . Highest education level: Not on file  Occupational History  . Not on file  Tobacco Use  . Smoking status: Current Some Day Smoker    Packs/day: 0.10    Years: 37.00    Pack years: 3.70    Types: Cigarettes  . Smokeless tobacco: Never Used  Vaping Use  . Vaping Use: Never used  Substance and Sexual Activity  . Alcohol use: No  . Drug use: No  . Sexual activity: Yes    Partners: Male  Other Topics Concern  . Not on file  Social History Narrative   Married. 2 children.    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    Walks with cane or wheelchair.    Former smoker.    Drinks caffeine.    Smoke alarm in the home.   Firearms in the home.    Wears her seatbelt.    Feels safe In her relationships.    Social Determinants of Health   Financial Resource Strain:   . Difficulty of Paying Living Expenses: Not on file  Food Insecurity:   . Worried About Charity fundraiser in the Last Year: Not on file  . Ran Out of Food in the Last Year: Not on file  Transportation Needs:   . Lack of Transportation (Medical): Not on file  . Lack of Transportation (Non-Medical): Not on file  Physical Activity:   . Days of Exercise per Week: Not on file  . Minutes of Exercise per Session: Not on file  Stress:   . Feeling of Stress : Not on file  Social Connections:   . Frequency of Communication with Friends and Family: Not on file  . Frequency of Social Gatherings with Friends and Family: Not on file  . Attends Religious Services: Not on file  . Active Member of Clubs or Organizations: Not on file  . Attends Archivist Meetings: Not on file  . Marital Status: Not on file  Intimate Partner Violence:   . Fear of Current or Ex-Partner: Not on file  . Emotionally Abused: Not on file  . Physically Abused: Not on file  . Sexually Abused: Not on file   Current Outpatient Medications on File  Prior to Visit  Medication Sig Dispense Refill  . acetaminophen (TYLENOL) 325 MG tablet Take 2 tablets (650 mg total) by mouth every 6 (six) hours as needed for mild pain or moderate pain (or Fever >/= 101). (Patient taking differently: Take 325 mg by mouth every 6 (six) hours as needed for mild pain or moderate pain (or Fever >/= 101). )    .  azelastine (ASTELIN) 0.1 % nasal spray Place 1-2 sprays into both nostrils 2 (two) times daily as needed for rhinitis. Use in each nostril as directed (Patient taking differently: Place 1-2 sprays into both nostrils daily as needed for rhinitis. Use in each nostril as directed) 30 mL 5  . baclofen (LIORESAL) 10 MG tablet TAKE 1 TABLET BY MOUTH THREE TIMES DAILY . APPOINTMENT REQUIRED FOR FUTURE REFILLS (Patient taking differently: Take 10 mg by mouth in the morning and at bedtime. ) 90 tablet 3  . calcium carbonate (TUMS - DOSED IN MG ELEMENTAL CALCIUM) 500 MG chewable tablet Chew 1 tablet by mouth 2 (two) times daily as needed for indigestion or heartburn.     . carvedilol (COREG) 3.125 MG tablet Take 1 tablet (3.125 mg total) by mouth in the morning and at bedtime. 180 tablet 3  . cefdinir (OMNICEF) 300 MG capsule Take 1 capsule (300 mg total) by mouth 2 (two) times daily. (Patient not taking: Reported on 07/13/2020) 20 capsule 0  . cholecalciferol (VITAMIN D) 1000 units tablet Take 1,000 Units by mouth daily.    . diclofenac Sodium (VOLTAREN) 1 % GEL APPLY 2 GRAMS TOPICALLY 4 TIMES A DAY (Patient taking differently: Apply 2 g topically in the morning and at bedtime. ) 300 g 0  . ferrous sulfate 325 (65 FE) MG tablet Take 325 mg by mouth 2 (two) times a week. Mondays & Thursdays.    . hydrOXYzine (ATARAX/VISTARIL) 10 MG tablet Take 1 tablet (10 mg total) by mouth at bedtime. 90 tablet 1  . levocetirizine (XYZAL) 5 MG tablet Take 1 tablet (5 mg total) by mouth every evening. 180 tablet 1  . Multiple Vitamin (MULTIVITAMIN WITH MINERALS) TABS tablet Take 1 tablet  by mouth daily.    . ondansetron (ZOFRAN ODT) 4 MG disintegrating tablet Take 1 tablet (4 mg total) by mouth every 8 (eight) hours as needed for nausea or vomiting. 30 tablet 0  . pantoprazole (PROTONIX) 40 MG tablet Take 1 tablet (40 mg total) by mouth daily. (Patient not taking: Reported on 07/13/2020) 90 tablet 0  . polyethylene glycol (MIRALAX / GLYCOLAX) 17 g packet Take 17 g by mouth 2 (two) times daily. 14 each 0  . pravastatin (PRAVACHOL) 20 MG tablet Take 1 tablet (20 mg total) by mouth every evening. 90 tablet 1  . rivaroxaban (XARELTO) 20 MG TABS tablet Take 1 tablet daily with evening meal (Patient taking differently: Take 20 mg by mouth daily with supper. ) 30 tablet 11  . senna-docusate (SENOKOT-S) 8.6-50 MG tablet 2 tabs today, then 1 tab QD (Patient taking differently: Take 1 tablet by mouth daily. 2 tabs today, then 1 tab QD) 30 tablet 0  . traMADol (ULTRAM) 50 MG tablet TAKE 1 TO 2 TABLETS BY MOUTH EVERY 8 HOURS AS NEEDED FOR MODERATE PAIN (Patient taking differently: Take 50 mg by mouth at bedtime. ) 180 tablet 3  . triamcinolone cream (KENALOG) 0.1 % Apply topically 4 (four) times daily. 30 g 0  . vitamin B-12 (CYANOCOBALAMIN) 100 MCG tablet Take 100 mcg by mouth daily.     No current facility-administered medications on file prior to visit.   Allergies  Allergen Reactions  . Dilaudid [Hydromorphone] Other (See Comments)    Hallucinations  . Entresto [Sacubitril-Valsartan] Swelling and Rash    Welts, possible angioedema   . Lopressor [Metoprolol Tartrate] Swelling    Angioedema  . Allegra Allergy [Fexofenadine Hcl] Hives  . Nsaids     On Xarelto  .  Gabapentin (Once-Daily) Rash  . Lipitor [Atorvastatin] Diarrhea   Family History  Problem Relation Age of Onset  . Heart attack Father   . Hypertension Mother   . Hyperlipidemia Mother   . Diabetes type II Mother   . Urticaria Mother   . Hypertension Brother   . Breast cancer Cousin 74  . Immunodeficiency Neg Hx    . Eczema Neg Hx   . Atopy Neg Hx   . Asthma Neg Hx   . Angioedema Neg Hx   . Allergic rhinitis Neg Hx   . Colon cancer Neg Hx   . Esophageal cancer Neg Hx   . Stomach cancer Neg Hx   . Pancreatic cancer Neg Hx   . Colon polyps Neg Hx   . Esophageal varices Neg Hx   . Inflammatory bowel disease Neg Hx   . Rectal cancer Neg Hx     PE: BP 94/64   Pulse 84   Ht 5\' 1"  (1.549 m)   Wt 104 lb 6.4 oz (47.4 kg)   LMP  (LMP Unknown) Comment: at age 29  SpO2 96%   BMI 19.73 kg/m  Wt Readings from Last 3 Encounters:  07/15/20 104 lb 6.4 oz (47.4 kg)  07/13/20 105 lb 9.6 oz (47.9 kg)  06/29/20 107 lb (48.5 kg)   Constitutional: thin, in NAD Eyes: PERRLA, EOMI, no exophthalmos ENT: moist mucous membranes, no thyromegaly but palpable, symmetric, thyroid, no cervical lymphadenopathy Cardiovascular: RRR, No MRG Respiratory: CTA B Gastrointestinal: abdomen soft, NT, ND, BS+ Musculoskeletal: no deformities, + weakness R arm and leg Skin: moist, warm, no rashes Neurological: no tremor with outstretched hands, DTR normal in all 4  ASSESSMENT: 1. Hashimoto thyroiditis  PLAN: 1. Hashimoto thyroiditis -Patient has a history of normal thyroid tests but positive antithyroid antibodies were found during investigation for hives, giving her diagnosis of Hashimoto's thyroiditis. -At last visit, we repeated her TFTs and the TSH was slightly elevated.  Free thyroid hormones were normal. -We discussed at that time that a slightly elevated TSH is not necessarily an indication for treatment with thyroid hormones -We did discuss at last visit about Hashimoto's thyroiditis being an autoimmune disorder.  Discussed about mechanism of thyroid inflammation leading to destruction of the gland with subsequent hypothyroidism.  However, the timeline of this process is unknown.  As of now, she did not require levothyroxine. -At last visit, I did suggest selenium 200 mcg daily and to return for labs in 2 months.  However, she did not start (forgot) -She has a palpable thyroid, most likely due to thyroid inflammation.  No neck compression symptoms. - At today's visit, we will recheck her TFTs + Thyroid Ab's and we may need to start levothyroxine afterwards.  Discussed about how to take it correctly in case we need to start. - She prefers to be called with results - RTC in 6 mo  Component     Latest Ref Rng & Units 07/15/2020  TSH     0.35 - 4.50 uIU/mL 2.22  T4,Free(Direct)     0.60 - 1.60 ng/dL 0.73  Triiodothyronine,Free,Serum     2.3 - 4.2 pg/mL 3.4  Thyroperoxidase Ab SerPl-aCnc     <9 IU/mL 474 (H)  Thyroglobulin Ab     < or = 1 IU/mL 67 (H)  TPO antibodies have improved, while ATA antibodies have increased.  Her thyroid function tests are all normal.  No levothyroxine needed for now.  I would still suggest to try selenium, 200 mcg  daily, if she agrees.  Philemon Kingdom, MD PhD [facility] Endocrinology

## 2020-07-16 LAB — THYROID PEROXIDASE ANTIBODY: Thyroperoxidase Ab SerPl-aCnc: 474 IU/mL — ABNORMAL HIGH (ref <9)

## 2020-07-16 LAB — THYROGLOBULIN ANTIBODY: Thyroglobulin Ab: 67 IU/mL — ABNORMAL HIGH (ref <=1)

## 2020-07-17 NOTE — Progress Notes (Signed)
[facility] Summary Report / Loop Recorder 

## 2020-07-20 ENCOUNTER — Telehealth: Payer: Self-pay | Admitting: Family Medicine

## 2020-07-20 NOTE — Telephone Encounter (Signed)
Spoke with pt who states she can't handle both Miralax and senna. Pt states she tolerates senna well, advise to continue taking senna as tolerated.

## 2020-07-20 NOTE — Telephone Encounter (Signed)
Patient would like cal back regarding stool softeners. Had been taking Senekot x 30 days and stopped taking because she ran out. She has Miralax power on hand and wants to know if that is okay to take instead. She states taking both was too much. Please call patient to advise.

## 2020-07-24 ENCOUNTER — Telehealth: Payer: Self-pay | Admitting: Cardiology

## 2020-07-24 DIAGNOSIS — Z01818 Encounter for other preprocedural examination: Secondary | ICD-10-CM

## 2020-07-24 DIAGNOSIS — I429 Cardiomyopathy, unspecified: Secondary | ICD-10-CM | POA: Insufficient documentation

## 2020-07-24 DIAGNOSIS — I252 Old myocardial infarction: Secondary | ICD-10-CM | POA: Insufficient documentation

## 2020-07-24 MED ORDER — ENOXAPARIN SODIUM 80 MG/0.8ML ~~LOC~~ SOLN: 80.0000 mg | SUBCUTANEOUS | 0 refills | Status: DC

## 2020-07-24 NOTE — Telephone Encounter (Signed)
Lorre Nick, NP from [facility] called to let us know that Ms. Hapke is having an ERCP for her pancreatitis on 08/06/2020.    She needs approval to hold the patient's Xarelto.  She wants to know if we can put her on Lovonox for that time.  She also wants the patient's last office note faxed to her at [phone]. I am also sending this to [facility] Reid,CVRR.  Please call Lucy back at [phone].

## 2020-07-24 NOTE — Telephone Encounter (Addendum)
Last office note has been faxed to [facility]. Returned call to [place] and left a message.  Will plan to bridge pt with Lovenox while off of Xarelto for upcoming ERCP due to history of afib and stroke. 3 day hold should be adequate. CrCl 75mL/min. Will dose Lovenox at 1.5mg /kg once daily = Lovenox 80mg  when rounding to nearest syringe size.  11/7 - last dose of Xarelto in PM  11/8 - inject Lovenox 80mg  into fatty tissue of the abdomen at 8pm. No Xarelto.  11/9 - inject Lovenox 80mg  into fatty tissue of the abdomen at 8pm. No Xarelto.  11/10 - no Xarelto, no Lovenox.  11/11 - procedure date  11/12 - restart Xarelto in evening if not advised to restart last night  Called pt to discuss bridge. She wishes to discuss bridge on 11/8 closer to procedure date. Will call pt on 11/8 to discuss bridge. She has previously been bridged with Lovenox and feels comfortable reviewing instructions on the phone vs in person. Will also mail a copy of bridging instructions to pt since she is not on MyChart. Lovenox rx sent in to pharmacy today.

## 2020-07-27 ENCOUNTER — Ambulatory Visit: Payer: 59 | Admitting: Physical Medicine & Rehabilitation

## 2020-08-03 NOTE — Telephone Encounter (Signed)
Patient called back. Reviewed instructions with patient. She understood all instructions. No questions.

## 2020-08-03 NOTE — Telephone Encounter (Signed)
Called pt to review lovenox instructions. Per husand, pt was in the shower. Left phone number for patient to call back at.

## 2020-08-17 ENCOUNTER — Other Ambulatory Visit: Payer: Self-pay | Admitting: Student

## 2020-08-17 ENCOUNTER — Other Ambulatory Visit: Payer: Self-pay | Admitting: Family Medicine

## 2020-08-17 ENCOUNTER — Ambulatory Visit (INDEPENDENT_AMBULATORY_CARE_PROVIDER_SITE_OTHER): Payer: 59

## 2020-08-17 DIAGNOSIS — I63512 Cerebral infarction due to unspecified occlusion or stenosis of left middle cerebral artery: Secondary | ICD-10-CM

## 2020-08-17 LAB — CUP PACEART REMOTE DEVICE CHECK
Date Time Interrogation Session: 20211121232707
Implantable Pulse Generator Implant Date: 20181203

## 2020-08-17 NOTE — Telephone Encounter (Signed)
rx refill

## 2020-08-18 NOTE — Progress Notes (Signed)
[facility] Summary Report / Loop Recorder 

## 2020-08-24 ENCOUNTER — Other Ambulatory Visit: Payer: Self-pay | Admitting: Family Medicine

## 2020-08-24 DIAGNOSIS — Z1231 Encounter for screening mammogram for malignant neoplasm of breast: Secondary | ICD-10-CM

## 2020-09-10 ENCOUNTER — Encounter: Payer: Self-pay | Admitting: Physical Medicine & Rehabilitation

## 2020-09-10 ENCOUNTER — Encounter: Payer: 59 | Attending: Physical Medicine & Rehabilitation | Admitting: Physical Medicine & Rehabilitation

## 2020-09-10 ENCOUNTER — Other Ambulatory Visit: Payer: Self-pay

## 2020-09-10 VITALS — BP 100/66 | HR 71 | Temp 98.2°F | Ht 61.0 in | Wt 105.8 lb

## 2020-09-10 DIAGNOSIS — M170 Bilateral primary osteoarthritis of knee: Secondary | ICD-10-CM | POA: Insufficient documentation

## 2020-09-10 DIAGNOSIS — G8110 Spastic hemiplegia affecting unspecified side: Secondary | ICD-10-CM | POA: Insufficient documentation

## 2020-09-10 DIAGNOSIS — R200 Anesthesia of skin: Secondary | ICD-10-CM | POA: Diagnosis not present

## 2020-09-10 DIAGNOSIS — G5602 Carpal tunnel syndrome, left upper limb: Secondary | ICD-10-CM | POA: Diagnosis present

## 2020-09-10 DIAGNOSIS — R269 Unspecified abnormalities of gait and mobility: Secondary | ICD-10-CM | POA: Diagnosis not present

## 2020-09-10 MED ORDER — PREGABALIN 50 MG PO CAPS: 50.0000 mg | ORAL_CAPSULE | Freq: Three times a day (TID) | ORAL | 1 refills | Status: DC

## 2020-09-10 NOTE — Progress Notes (Signed)
Subjective:    Patient ID: [name], female    DOB: 1961/03/16, 59 y.o.   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    Last clinic visit 07/13/20.  Since that time, pt states she is rarely wearing her orthosis. She is doing HEP. She is taking Balcofen BID.  Denies falls. She still has not tried Lidocaine patch for her knee. Voltaren gel helps to wrist.  She had stents placed for pancreatitis.  Pain Inventory Average Pain 6 Pain Right Now 8 My pain is tingling and numbness  In the last 24 hours, has pain interfered with the following? General activity 6 Relation with others 6 Enjoyment of life 6 What TIME of day is your pain at its worst? morning Sleep (in general) Good  Pain is worse with: some activites Pain improves with: rest Relief from Meds: 3    Family History  Problem Relation Age of Onset   Heart attack Father    Hypertension Mother    Hyperlipidemia Mother    Diabetes type II Mother    Urticaria Mother    Hypertension Brother    Breast cancer Cousin 43   Immunodeficiency Neg Hx    Eczema Neg Hx    Atopy Neg Hx    Asthma Neg Hx    Angioedema Neg Hx    Allergic rhinitis Neg Hx    Colon cancer Neg Hx    Esophageal cancer Neg Hx    Stomach cancer Neg Hx    Pancreatic cancer Neg Hx    Colon polyps Neg Hx    Esophageal varices Neg Hx    Inflammatory bowel disease Neg Hx    Rectal cancer Neg Hx    Social History   Socioeconomic History   Marital status: Married    Spouse name: Not on file   Number of children: 2   Years of education: Not on file   Highest education level: Not on file  Occupational History   Not on file  Tobacco Use   Smoking status: Current Some Day Smoker    Packs/day: 0.10    Years: 37.00    Pack  years: 3.70    Types: Cigarettes   Smokeless tobacco: Never Used  Scientific laboratory technician Use: Never used  Substance and Sexual Activity   Alcohol use: No   Drug use: No   Sexual activity: Yes    Partners: Male  Other Topics Concern   Not on file  Social History Narrative   Married. 2 children.    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    Walks with cane or wheelchair.    Former smoker.    Drinks caffeine.    Smoke alarm in the home.   Firearms in the home.    Wears her seatbelt.    Feels safe In her relationships.    Social Determinants of Health   Financial Resource Strain: Not on file  Food Insecurity: Not on file  Transportation Needs: Not on file  Physical Activity: Not on file  Stress: Not on file  Social Connections: Not on file   Past Surgical History:  Procedure Laterality Date   BIOPSY  12/02/2019   Procedure: BIOPSY;  Surgeon: Rush Landmark Telford Nab., MD;  Location: [place];  Service: Gastroenterology;;   CHOLECYSTECTOMY N/A 04/30/2020   Procedure:  LAPAROSCOPIC CHOLECYSTECTOMY WITH INTRAOPERATIVE CHOLANGIOGRAM;  Surgeon: Armandina Gemma, MD;  Location: [facility];  Service: General;  Laterality: N/A;   CORONARY ARTERY BYPASS GRAFT N/A 11/23/2017   Procedure: CORONARY ARTERY BYPASS GRAFTING (CABG) x 2, ON PUMP, USING LEFT INTERNAL MAMMARY ARTERY AND RIGHT GREATER SAPHENOUS VEIN HARVESTED ENDOSCOPICALLY;  Surgeon: Gaye Pollack, MD;  Location: [place];  Service: Open Heart Surgery;  Laterality: N/A;   CRYOABLATION     cervical   ESOPHAGOGASTRODUODENOSCOPY (EGD) WITH PROPOFOL N/A 12/02/2019   Procedure: ESOPHAGOGASTRODUODENOSCOPY (EGD) WITH PROPOFOL;  Surgeon: Rush Landmark Telford Nab., MD;  Location: [place] [facility];  Service: Gastroenterology;  Laterality: N/A;   ESOPHAGOGASTRODUODENOSCOPY (EGD) WITH PROPOFOL N/A 03/19/2020   Procedure: ESOPHAGOGASTRODUODENOSCOPY (EGD) WITH PROPOFOL;  Surgeon: Milus Banister, MD;  Location: [facility];  Service: Endoscopy;  Laterality:  N/A;   EUS N/A 03/19/2020   Procedure: UPPER ENDOSCOPIC ULTRASOUND (EUS) RADIAL;  Surgeon: Milus Banister, MD;  Location: [facility];  Service: Endoscopy;  Laterality: N/A;   EUS N/A 03/19/2020   Procedure: UPPER ENDOSCOPIC ULTRASOUND (EUS) LINEAR;  Surgeon: Milus Banister, MD;  Location: [facility];  Service: Endoscopy;  Laterality: N/A;   FINE NEEDLE ASPIRATION  12/02/2019   Procedure: FINE NEEDLE ASPIRATION (FNA) LINEAR;  Surgeon: Irving Copas., MD;  Location: [place];  Service: Gastroenterology;;   FINE NEEDLE ASPIRATION N/A 03/19/2020   Procedure: FINE NEEDLE ASPIRATION (FNA) LINEAR;  Surgeon: Milus Banister, MD;  [facility];  Service: Endoscopy;  Laterality: N/A;   IR ANGIO INTRA EXTRACRAN SEL COM CAROTID INNOMINATE UNI R MOD SED  08/21/2017   IR ANGIO VERTEBRAL SEL SUBCLAVIAN INNOMINATE UNI R MOD SED  08/21/2017   IR PERCUTANEOUS ART THROMBECTOMY/INFUSION INTRACRANIAL INC DIAG ANGIO  08/21/2017   IR RADIOLOGIST EVAL & MGMT  10/30/2017   LOOP RECORDER INSERTION N/A 08/28/2017   Procedure: LOOP RECORDER INSERTION;  Surgeon: Constance Haw, MD;  Location: [facility];  Service: Cardiovascular;  Laterality: N/A;   RADIOLOGY WITH ANESTHESIA N/A 08/21/2017   Procedure: RADIOLOGY WITH ANESTHESIA;  Surgeon: Luanne Bras, MD;  Location: [place];  Service: Radiology;  Laterality: N/A;   RIGHT/LEFT HEART CATH AND CORONARY ANGIOGRAPHY N/A 11/20/2017   Procedure: RIGHT/LEFT HEART CATH AND CORONARY ANGIOGRAPHY;  Surgeon: Jettie Booze, MD;  Location: [place] [facility];  Service: Cardiovascular;  Laterality: N/A;   TEE WITHOUT CARDIOVERSION N/A 08/28/2017   Procedure: TRANSESOPHAGEAL ECHOCARDIOGRAM (TEE);  Surgeon: Fay Records, MD;  Location: [place];  Service: Cardiovascular;  Laterality: N/A;   TEE WITHOUT CARDIOVERSION N/A 11/23/2017   Procedure: TRANSESOPHAGEAL ECHOCARDIOGRAM (TEE);  Surgeon: Gaye Pollack, MD;  Location: [place];  Service: Open Heart Surgery;  Laterality: N/A;   TUBAL LIGATION     UPPER ESOPHAGEAL ENDOSCOPIC ULTRASOUND (EUS) N/A 12/02/2019   Procedure: UPPER ESOPHAGEAL ENDOSCOPIC ULTRASOUND (EUS);  Surgeon: Irving Copas., MD;  Location: [place];  Service: Gastroenterology;  Laterality: N/A;   Past Medical History:  Diagnosis Date   AAA (abdominal aortic aneurysm) ([place])    a. 3.3cm infrarenal by CT 02/2020.   Angio-edema    Arthritis    CAD (coronary artery disease)    a. s/p NSTEMI in 10/2017 with cath showing LM disease --> s/p CABG on 11/23/2017 with LIMA-LAD and SVG-OM.    Carotid stenosis    a. Duplex 11/2018 3-53% RICA, 61-44% LICA.   Gastritis and duodenitis 12/06/2019   11/2019 Peptic duodenitis  - No dysplasia or malignancy identified  B.  STOMACH, BIOPSY:  - Mild chronic gastritis with intestinal metaplasia    Hay fever    History of kidney stones    Ischemic cardiomyopathy    a. EF 35-40% by echo in 10/2017. b. 55% by echo 01/2020.   NSTEMI (non-ST elevated myocardial infarction) ([facility])    PAF (paroxysmal atrial fibrillation) ([place])    Diagnosed by ILR   Pancreatitis    Pilonidal cyst 10/21/2019   Pneumonia 11/16/2017   Stroke ([place]) 08/23/2017   Left MCA infarct status post TPA and thrombectomy   Urticaria    BP 100/66    Pulse 71    Temp 98.2 F (36.8 C)    Ht 5\' 1"  (1.549 m)    Wt 105 lb 12.8 oz (48 kg)    LMP  (LMP Unknown) Comment: at age 59   SpO2 97%    BMI 19.99 kg/m   Opioid Risk Score:   Fall Risk Score:  `1  Depression screen PHQ 2/9  Depression screen [facility] 2/9 07/13/2020 05/05/2020 11/22/2018 10/22/2018 10/22/2018 10/19/2018  Decreased Interest 0 0 0 0 0 0  Down, Depressed, Hopeless 0 0 0 0 0 0  PHQ - 2 Score 0 0 0 0 0 0  Altered sleeping - - - 0 - -  Tired, decreased energy - - - 1 - -  Change in appetite - - - 0 - -  Feeling bad or failure about yourself  - - - 0 - -  Trouble concentrating - - - 0 - -  Moving slowly or  fidgety/restless - - - 0 - -  Suicidal thoughts - - - 0 - -  PHQ-9 Score - - - 1 - -  Difficult doing work/chores - - - Not difficult at all - -  Some recent data might be hidden   Review of Systems  Constitutional: Negative.   HENT: Negative.   Eyes: Negative.   Respiratory: Negative.   Gastrointestinal: Negative.   Endocrine: Negative.   Genitourinary: Negative.   Musculoskeletal: Positive for arthralgias and gait problem.  Skin: Positive for rash.  Allergic/Immunologic: Negative.   Neurological: Positive for weakness and numbness.  Hematological: Negative.   Psychiatric/Behavioral: Negative.   All other systems reviewed and are negative.     Objective:   Physical Exam  Constitutional: No distress . Vital signs reviewed. HENT: Normocephalic.  Atraumatic. Eyes: EOMI. No discharge. Cardiovascular: No JVD.   Respiratory: Normal effort.  No stridor.   GI: Non-distended.   Skin: Warm and dry.  Posterior neck with lesion. Psych: Normal mood.  Normal behavior. 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  1. 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 Status post loop recorder  Encouraged PRAFO/WHO/AFO  Cont Fluoxetine  She followed up with Vascular, no further intervention at present  Continue HEP  2. Spastic hemiplegia  Continue Baclofen to 10 TID (typically takes BID)  Will hold off on Botox at this time  3. Neurologic gait abnormality  Foot drag improved, improved with cognizant  ambulation  No longer requiring quad cane  Does  not need AFO anymore  Continue HEP  4. Bilateral knee OA  B/l Zilretta injections performed on 03/23/18 - with good benefit  Steroid injection on 5/28 with benefit, minimal benefit in 10/2019  Synvisc b/l on 10/05/2018 with benefit, repeat injection on 06/29/20 with benefit in right knee, limited benefit in left knee  Encouraged trial Lidoderm patch, reminded againx6  Xrays reviewed, medial > lateral OA b/l  Continue Voltaren gel   Will likely require referral to Ortho  5. Left CTS  Allergic to Gabapentin  Will trial Pregabalin 50 BID  NCS/EMG showing L>R CTS  Cont bracing   Continue Voltaren gel on wrist  Will consider steroid injection to wrist on next visit  6. Rash  Continue to follow up with Allergy

## 2020-09-16 ENCOUNTER — Other Ambulatory Visit: Payer: Self-pay | Admitting: Physical Medicine & Rehabilitation

## 2020-09-21 ENCOUNTER — Other Ambulatory Visit: Payer: Self-pay | Admitting: Physical Medicine & Rehabilitation

## 2020-10-06 ENCOUNTER — Ambulatory Visit: Payer: 59

## 2020-10-06 ENCOUNTER — Ambulatory Visit
Admission: RE | Admit: 2020-10-06 | Discharge: 2020-10-06 | Disposition: A | Discharge status: Home / Self Care | Payer: 59 | Source: Ambulatory Visit | Attending: Family Medicine | Admitting: Family Medicine

## 2020-10-06 ENCOUNTER — Other Ambulatory Visit: Payer: Self-pay

## 2020-10-06 DIAGNOSIS — Z1231 Encounter for screening mammogram for malignant neoplasm of breast: Secondary | ICD-10-CM

## 2020-10-12 ENCOUNTER — Ambulatory Visit: Payer: 59 | Admitting: Cardiology

## 2020-10-15 ENCOUNTER — Telehealth: Payer: Self-pay | Admitting: *Deleted

## 2020-10-15 NOTE — Telephone Encounter (Signed)
[name] was not able to get the pregabalin (needs prior auth) and was asking if she should come to the appt Monday. She would like to get the injection if possible.  Please advise.

## 2020-10-15 NOTE — Telephone Encounter (Signed)
Have we tried to get prior auth?  I will not be able to perform the injection on Monday, I will need more time. She can reschedule for another time if she would like.

## 2020-10-16 ENCOUNTER — Other Ambulatory Visit: Payer: Self-pay

## 2020-10-16 ENCOUNTER — Encounter: Payer: Self-pay | Admitting: Student

## 2020-10-16 ENCOUNTER — Telehealth (INDEPENDENT_AMBULATORY_CARE_PROVIDER_SITE_OTHER): Payer: 59 | Admitting: Student

## 2020-10-16 VITALS — BP 100/66 | HR 71 | Ht 61.0 in | Wt 108.0 lb

## 2020-10-16 DIAGNOSIS — I480 Paroxysmal atrial fibrillation: Secondary | ICD-10-CM

## 2020-10-16 DIAGNOSIS — Z8679 Personal history of other diseases of the circulatory system: Secondary | ICD-10-CM | POA: Diagnosis not present

## 2020-10-16 DIAGNOSIS — Z8673 Personal history of transient ischemic attack (TIA), and cerebral infarction without residual deficits: Secondary | ICD-10-CM

## 2020-10-16 DIAGNOSIS — E785 Hyperlipidemia, unspecified: Secondary | ICD-10-CM

## 2020-10-16 DIAGNOSIS — I2510 Atherosclerotic heart disease of native coronary artery without angina pectoris: Secondary | ICD-10-CM

## 2020-10-16 NOTE — Telephone Encounter (Signed)
I have called and left her a message telling her to call the office to reschedule.

## 2020-10-16 NOTE — Patient Instructions (Signed)
Medication Instructions:  Your physician recommends that you continue on your current medications as directed. Please refer to the Current Medication list given to you today.  *If you need a refill on your cardiac medications before your next appointment, please call your pharmacy*   Lab Work: None Today If you have labs (blood work) drawn today and your tests are completely normal, you will receive your results only by: [name] MyChart Message (if you have MyChart) OR . A paper copy in the mail If you have any lab test that is abnormal or we need to change your treatment, we will call you to review the results.   Testing/Procedures: None Today   Follow-Up: At [facility], you and your health needs are our priority.  As part of our continuing mission to provide you with exceptional heart care, we have created designated Provider Care Teams.  These Care Teams include your primary Cardiologist (physician) and Advanced Practice Providers (APPs -  Physician Assistants and Nurse Practitioners) who all work together to provide you with the care you need, when you need it.  We recommend signing up for the patient portal called "MyChart".  Sign up information is provided on this After Visit Summary.  MyChart is used to connect with patients for Virtual Visits (Telemedicine).  Patients are able to view lab/test results, encounter notes, upcoming appointments, etc.  Non-urgent messages can be sent to your provider as well.   To learn more about what you can do with MyChart, go to [email or web address].    Your next appointment:   6 month(s)  The format for your next appointment:   In Person  Provider:   You may see [name], MD or one of the following Advanced Practice Providers on your designated Care Team:    Bernerd Pho, PA-C      Other Instructions None Today

## 2020-10-16 NOTE — Progress Notes (Addendum)
Virtual Visit via Telephone Note   This visit type was conducted due to national recommendations for restrictions regarding the COVID-19 Pandemic (e.g. social distancing) in an effort to limit this patient's exposure and mitigate transmission in our community.  Due to her co-morbid illnesses, this patient is at least at moderate risk for complications without adequate follow up.  This format is felt to be most appropriate for this patient at this time.  The patient did not have access to video technology/had technical difficulties with video requiring transitioning to audio format only (telephone).  All issues noted in this document were discussed and addressed.  No physical exam could be performed with this format.  Please refer to the patient's chart for her  consent to telehealth for [facility].    Date:  10/17/2020   ID:  [name], DOB 02/03/1961, MRN [id number] The patient was identified using 2 identifiers.  Patient Location: Home Provider Location: Office/Clinic  PCP:  [name], DO  Cardiologist:  [name], MD Electrophysiologist:  Constance Haw, MD   Evaluation Performed:  Follow-Up Visit  Chief Complaint: Follow-up visit  History of Present Illness:    [name] is a 60 y.o. female with past medical historyofCAD (s/p NSTEMI in 10/2017 with 3-vessel CAD by cath and CABG on 11/23/2017 with LIMA-LAD and SVG-OM),PAF (on Xarelto),ischemic cardiomyopathy (EF 35-40% by echo in 10/2017, EF improved to 50-55% by repeat echo in 07/2019, at 55% by echo in 01/2020), carotid artery stenosis,HLD, and prior CVA (ILR in place) who presents for a 84-month follow-up telehealth visit.  She was last examined by myself in 03/2020 and denied any recent chest pain or dyspnea on exertion at that time. She had recently been hospitalized for recurrent pancreatitis and was planning to undergo an outpatient cholecystectomy. By review of notes, this was performed in  04/2020 with no immediate postoperative complications noted. Was hospitalized again in 05/2020 for recurrent pancreatitis and was ultimately referred to [facility] as an outpatient. She did undergo ERCP with placement of endoscopic stent into the biliary/pancreatic duct on 08/06/2020 at [facility].  In talking with the patient today, she reports overall doing well since her last office visit. She has not had any recurrent abdominal pain since her ERCP with stent placement and says that her appetite continues to improve. She denies any recent chest pain or dyspnea on exertion.  She does report an occasional "sensation" along her chest and has been unable to quantify how frequently this occurs. She does still have her LINQ in place but no reports have been uploaded for 08/2020. She is scheduled to see Dr. Curt Bears next week. Denies any recent orthopnea, PND or edema.   The patient does not have symptoms concerning for COVID-19 infection (fever, chills, cough, or new shortness of breath).    Past Medical History:  Diagnosis Date  . AAA (abdominal aortic aneurysm) ([place])    a. 3.3cm infrarenal by CT 02/2020.  . Angio-edema   . Arthritis   . CAD (coronary artery disease)    a. s/p NSTEMI in 10/2017 with cath showing LM disease --> s/p CABG on 11/23/2017 with LIMA-LAD and SVG-OM.   [name] Carotid stenosis    a. Duplex 11/2018 [id number] RICA, [id number] LICA.  [name] Gastritis and duodenitis 12/06/2019   11/2019 Peptic duodenitis  - No dysplasia or malignancy identified  B. STOMACH, BIOPSY:  - Mild chronic gastritis with intestinal metaplasia   . Hay fever   .  History of kidney stones   . Ischemic cardiomyopathy    a. EF 35-40% by echo in 10/2017. b. 55% by echo 01/2020.  . NSTEMI (non-ST elevated myocardial infarction) ([place])   . PAF (paroxysmal atrial fibrillation) ([place])    Diagnosed by ILR  . Pancreatitis   . Pilonidal cyst 10/21/2019  . Pneumonia 11/16/2017  . Stroke [facility]) 08/23/2017   Left  MCA infarct status post TPA and thrombectomy  . Urticaria    Past Surgical History:  Procedure Laterality Date  . BIOPSY  12/02/2019   Procedure: BIOPSY;  Surgeon: Rush Landmark Telford Nab., MD;  Location: [place];  Service: Gastroenterology;;  . CHOLECYSTECTOMY N/A 04/30/2020   Procedure: LAPAROSCOPIC CHOLECYSTECTOMY WITH INTRAOPERATIVE CHOLANGIOGRAM;  Surgeon: Armandina Gemma, MD;  Location: [facility];  Service: General;  Laterality: N/A;  . CORONARY ARTERY BYPASS GRAFT N/A 11/23/2017   Procedure: CORONARY ARTERY BYPASS GRAFTING (CABG) x 2, ON PUMP, USING LEFT INTERNAL MAMMARY ARTERY AND RIGHT GREATER SAPHENOUS VEIN HARVESTED ENDOSCOPICALLY;  Surgeon: Gaye Pollack, MD;  Location: [place];  Service: Open Heart Surgery;  Laterality: N/A;  . CRYOABLATION     cervical  . ESOPHAGOGASTRODUODENOSCOPY (EGD) WITH PROPOFOL N/A 12/02/2019   Procedure: ESOPHAGOGASTRODUODENOSCOPY (EGD) WITH PROPOFOL;  Surgeon: Rush Landmark Telford Nab., MD;  Location: [place];  Service: Gastroenterology;  Laterality: N/A;  . ESOPHAGOGASTRODUODENOSCOPY (EGD) WITH PROPOFOL N/A 03/19/2020   Procedure: ESOPHAGOGASTRODUODENOSCOPY (EGD) WITH PROPOFOL;  Surgeon: Milus Banister, MD;  [facility];  Service: Endoscopy;  Laterality: N/A;  . EUS N/A 03/19/2020   Procedure: UPPER ENDOSCOPIC ULTRASOUND (EUS) RADIAL;  Surgeon: Milus Banister, MD;  [facility];  Service: Endoscopy;  Laterality: N/A;  . EUS N/A 03/19/2020   Procedure: UPPER ENDOSCOPIC ULTRASOUND (EUS) LINEAR;  Surgeon: Milus Banister, MD;  [facility];  Service: Endoscopy;  Laterality: N/A;  . FINE NEEDLE ASPIRATION  12/02/2019   Procedure: FINE NEEDLE ASPIRATION (FNA) LINEAR;  Surgeon: Irving Copas., MD;  Location: [place];  Service: Gastroenterology;;  . FINE NEEDLE ASPIRATION N/A 03/19/2020   Procedure: FINE NEEDLE ASPIRATION (FNA) LINEAR;  Surgeon: Milus Banister, MD;  [facility];  Service: Endoscopy;   Laterality: N/A;  . IR ANGIO INTRA EXTRACRAN SEL COM CAROTID INNOMINATE UNI R MOD SED  08/21/2017  . IR ANGIO VERTEBRAL SEL SUBCLAVIAN INNOMINATE UNI R MOD SED  08/21/2017  . IR PERCUTANEOUS ART THROMBECTOMY/INFUSION INTRACRANIAL INC DIAG ANGIO  08/21/2017  . IR RADIOLOGIST EVAL & MGMT  10/30/2017  . LOOP RECORDER INSERTION N/A 08/28/2017   Procedure: LOOP RECORDER INSERTION;  Surgeon: Constance Haw, MD;  Location: [place] [facility];  Service: Cardiovascular;  Laterality: N/A;  . RADIOLOGY WITH ANESTHESIA N/A 08/21/2017   Procedure: RADIOLOGY WITH ANESTHESIA;  Surgeon: Luanne Bras, MD;  Location: [place];  Service: Radiology;  Laterality: N/A;  . RIGHT/LEFT HEART CATH AND CORONARY ANGIOGRAPHY N/A 11/20/2017   Procedure: RIGHT/LEFT HEART CATH AND CORONARY ANGIOGRAPHY;  Surgeon: Jettie Booze, MD;  Location: [place] [facility];  Service: Cardiovascular;  Laterality: N/A;  . TEE WITHOUT CARDIOVERSION N/A 08/28/2017   Procedure: TRANSESOPHAGEAL ECHOCARDIOGRAM (TEE);  Surgeon: Fay Records, MD;  Location: [place];  Service: Cardiovascular;  Laterality: N/A;  . TEE WITHOUT CARDIOVERSION N/A 11/23/2017   Procedure: TRANSESOPHAGEAL ECHOCARDIOGRAM (TEE);  Surgeon: Gaye Pollack, MD;  Location: [place];  Service: Open Heart Surgery;  Laterality: N/A;  . TUBAL LIGATION    . UPPER ESOPHAGEAL ENDOSCOPIC ULTRASOUND (EUS) N/A 12/02/2019  Procedure: UPPER ESOPHAGEAL ENDOSCOPIC ULTRASOUND (EUS);  Surgeon: Irving Copas., MD;  Location: [place];  Service: Gastroenterology;  Laterality: N/A;     Current Meds  Medication Sig  . acetaminophen (TYLENOL) 325 MG tablet Take 2 tablets (650 mg total) by mouth every 6 (six) hours as needed for mild pain or moderate pain (or Fever >/= 101). (Patient taking differently: Take 325 mg by mouth every 6 (six) hours as needed for mild pain or moderate pain (or Fever >/= 101).)  . baclofen (LIORESAL) 10 MG tablet TAKE 1 TABLET BY MOUTH THREE  TIMES DAILY . APPOINTMENT REQUIRED FOR FUTURE REFILLS (Patient taking differently: Take 10 mg by mouth in the morning and at bedtime.)  . calcium carbonate (TUMS - DOSED IN MG ELEMENTAL CALCIUM) 500 MG chewable tablet Chew 1 tablet by mouth 2 (two) times daily as needed for indigestion or heartburn.   . carvedilol (COREG) 3.125 MG tablet Take 1 tablet (3.125 mg total) by mouth in the morning and at bedtime.  . cholecalciferol (VITAMIN D) 1000 units tablet Take 1,000 Units by mouth daily.  . diclofenac Sodium (VOLTAREN) 1 % GEL APPLY 2 GRAMS TOPICALLY 4 TIMES A DAY  . ferrous sulfate 325 (65 FE) MG tablet Take 325 mg by mouth 2 (two) times a week. Mondays & Thursdays.  . hydrOXYzine (ATARAX/VISTARIL) 10 MG tablet TAKE 1 TABLET BY MOUTH AT BEDTIME  . levocetirizine (XYZAL) 5 MG tablet Take 1 tablet (5 mg total) by mouth every evening.  . pantoprazole (PROTONIX) 40 MG tablet Take 1 tablet (40 mg total) by mouth daily.  . pravastatin (PRAVACHOL) 20 MG tablet TAKE 1 TABLET BY MOUTH ONCE DAILY IN THE EVENING  . rivaroxaban (XARELTO) 20 MG TABS tablet Take 1 tablet daily with evening meal (Patient taking differently: Take 20 mg by mouth daily with supper.)  . selenium 200 MCG TABS tablet Take by mouth.  . senna-docusate (SENOKOT-S) 8.6-50 MG tablet 2 tabs today, then 1 tab QD (Patient taking differently: Take 1 tablet by mouth daily. 2 tabs today, then 1 tab QD)  . traMADol (ULTRAM) 50 MG tablet TAKE 1 TO 2 TABLETS BY MOUTH EVERY 8 HOURS AS NEEDED FOR MODERATE PAIN (Patient taking differently: Take 50 mg by mouth at bedtime.)  . triamcinolone cream (KENALOG) 0.1 % Apply topically 4 (four) times daily.  . vitamin B-12 (CYANOCOBALAMIN) 100 MCG tablet Take 100 mcg by mouth daily.     Allergies:   Hydromorphone, Lopressor [metoprolol tartrate], Sacubitril-valsartan, Allegra allergy [fexofenadine hcl], Fexofenadine, Nsaids, Gabapentin, Gabapentin (once-daily), and Lipitor [atorvastatin]   Social History    Tobacco Use  . Smoking status: Current Some Day Smoker    Packs/day: 0.10    Years: 37.00    Pack years: 3.70    Types: Cigarettes  . Smokeless tobacco: Never Used  Vaping Use  . Vaping Use: Never used  Substance Use Topics  . Alcohol use: No  . Drug use: No     Family Hx: The patient's family history includes Breast cancer (age of onset: 75) in her cousin; Diabetes type II in her mother; Heart attack in her father; Hyperlipidemia in her mother; Hypertension in her brother and mother; Urticaria in her mother. There is no history of Immunodeficiency, Eczema, Atopy, Asthma, Angioedema, Allergic rhinitis, Colon cancer, Esophageal cancer, Stomach cancer, Pancreatic cancer, Colon polyps, Esophageal varices, Inflammatory bowel disease, or Rectal cancer.  ROS:   Please see the history of present illness.     All other systems reviewed  and are negative.   Prior CV studies:   The following studies were reviewed today:  Echocardiogram: 01/2020 IMPRESSIONS    1. Left ventricular ejection fraction, by estimation, is 55%. The left  ventricle has normal function. The left ventricle demonstrates regional  wall motion abnormalities (see scoring diagram/findings for description).  There is mild left ventricular  hypertrophy. Left ventricular diastolic parameters are consistent with  Grade I diastolic dysfunction (impaired relaxation).  2. Right ventricular systolic function is normal. The right ventricular  size is normal. Tricuspid regurgitation signal is inadequate for assessing  PA pressure.  3. Left atrial size was upper normal.  4. The mitral valve is grossly normal. Mild mitral valve regurgitation.  5. The aortic valve is tricuspid. Aortic valve regurgitation is not  visualized. Mild aortic valve sclerosis is present, with no evidence of  aortic valve stenosis.  6. The inferior vena cava is normal in size with greater than 50%  respiratory variability, suggesting right  atrial pressure of 3 mmHg.    Labs/Other Tests and Data Reviewed:    EKG:  No ECG reviewed.  Recent Labs: 03/18/2020: Magnesium 2.0 06/18/2020: ALT 10; Hemoglobin 12.4; Platelets 242 06/19/2020: BUN 6; Creatinine, Ser 0.47; Potassium 3.5; Sodium 137 07/15/2020: TSH 2.22   Recent Lipid Panel Lab Results  Component Value Date/Time   CHOL 203 (H) 09/11/2019 04:53 AM   TRIG 85 09/11/2019 04:53 AM   HDL 28 (L) 09/11/2019 04:53 AM   CHOLHDL 7.3 09/11/2019 04:53 AM   LDLCALC 158 (H) 09/11/2019 04:53 AM    Wt Readings from Last 3 Encounters:  10/16/20 108 lb (49 kg)  09/10/20 105 lb 12.8 oz (48 kg)  07/15/20 104 lb 6.4 oz (47.4 kg)     Objective:    Vital Signs:  BP 100/66   Pulse 71   Ht 5\' 1"  (1.549 m)   Wt 108 lb (49 kg)   LMP  (LMP Unknown) Comment: at age 75  BMI 20.41 kg/m    General: Pleasant female sounding in NAD Psych: Normal affect. Neuro: Alert and oriented X 3. Lungs:  Resp regular and unlabored while talking on the phone.   ASSESSMENT & PLAN:    1. CAD - She is s/p NSTEMI in 10/2017 with 3-vessel CAD by cath and CABG on 11/23/2017 with LIMA-LAD and SVG-OM. She denies any recent chest pain or dyspnea on exertion. - Continue Coreg 3.125mg  BID and Pravastatin 20mg  daily. She is no longer on ASA given the need for anticoagulation.   2. Paroxysmal Atrial Fibrillation - She does report occasional "sensations" along her chest. Recent LINQ recordings have not demonstrated recurrent atrial fibrillation by review of the reports. Possibly due to PAC's or PVC's. She does consume 3-4 cups of coffee on a daily basis so I encouraged her to reduce her caffeine intake. Hopefully her LINQ can be interrogated again at the time of her visit with EP next week. Continue Coreg 3.125mg  BID. Did not further titrate given BP at 100/66. - She denies any evidence of active bleeding. Remains on Xarelto 20mg  daily for anticoagulation.   3. History of Ischemic Cardiomyopathy - Her EF was  previously reduced at 35-40% by echo in 10/2017, EF improved to 50-55% by repeat echo in 07/2019 and at 55% by echo in 01/2020. - No reports of orthopnea, PND or edema. Continue Coreg 3.125mg  BID.   4. HLD - Due for repeat FLP with upcoming labs. Remains on Pravastatin 20mg  daily as she was previously intolerant to higher-intensity  statin therapy.   5. Prior CVA - She does have residual right sided weakness. Remains on anticoagulation and statin therapy.   COVID-19 Education: The signs and symptoms of COVID-19 were discussed with the patient and how to seek care for testing (follow up with PCP or arrange E-visit). The importance of social distancing was discussed today.  Time:   Today, I have spent 18 minutes with the patient with telehealth technology discussing the above problems.     Medication Adjustments/Labs and Tests Ordered: Current medicines are reviewed at length with the patient today.  Concerns regarding medicines are outlined above.   Tests Ordered: No orders of the defined types were placed in this encounter.   Medication Changes: No orders of the defined types were placed in this encounter.   Follow Up:  In Person in 6 month(s)  Signed, Erma Heritage, PA-C  10/17/2020 9:05 AM    [place]

## 2020-10-19 ENCOUNTER — Encounter: Payer: 59 | Admitting: Physical Medicine & Rehabilitation

## 2020-10-20 ENCOUNTER — Encounter: Payer: Self-pay | Admitting: Family Medicine

## 2020-10-20 ENCOUNTER — Other Ambulatory Visit: Payer: Self-pay

## 2020-10-20 ENCOUNTER — Telehealth: Payer: Self-pay | Admitting: Family Medicine

## 2020-10-20 ENCOUNTER — Ambulatory Visit (HOSPITAL_BASED_OUTPATIENT_CLINIC_OR_DEPARTMENT_OTHER)
Admission: RE | Admit: 2020-10-20 | Discharge: 2020-10-20 | Disposition: A | Discharge status: Home / Self Care | Payer: 59 | Source: Ambulatory Visit | Attending: Family Medicine | Admitting: Family Medicine

## 2020-10-20 ENCOUNTER — Ambulatory Visit (INDEPENDENT_AMBULATORY_CARE_PROVIDER_SITE_OTHER): Payer: 59 | Admitting: Family Medicine

## 2020-10-20 VITALS — HR 72 | Temp 98.1°F | Ht 61.0 in | Wt 106.0 lb

## 2020-10-20 DIAGNOSIS — R300 Dysuria: Secondary | ICD-10-CM

## 2020-10-20 DIAGNOSIS — W19XXXA Unspecified fall, initial encounter: Secondary | ICD-10-CM | POA: Insufficient documentation

## 2020-10-20 DIAGNOSIS — S4992XA Unspecified injury of left shoulder and upper arm, initial encounter: Secondary | ICD-10-CM | POA: Insufficient documentation

## 2020-10-20 DIAGNOSIS — M25561 Pain in right knee: Secondary | ICD-10-CM

## 2020-10-20 DIAGNOSIS — R2689 Other abnormalities of gait and mobility: Secondary | ICD-10-CM

## 2020-10-20 DIAGNOSIS — S42022A Displaced fracture of shaft of left clavicle, initial encounter for closed fracture: Secondary | ICD-10-CM | POA: Diagnosis not present

## 2020-10-20 DIAGNOSIS — M19012 Primary osteoarthritis, left shoulder: Secondary | ICD-10-CM | POA: Insufficient documentation

## 2020-10-20 DIAGNOSIS — M25562 Pain in left knee: Secondary | ICD-10-CM

## 2020-10-20 DIAGNOSIS — M25512 Pain in left shoulder: Secondary | ICD-10-CM | POA: Diagnosis present

## 2020-10-20 DIAGNOSIS — S42002A Fracture of unspecified part of left clavicle, initial encounter for closed fracture: Secondary | ICD-10-CM

## 2020-10-20 LAB — POCT URINALYSIS DIPSTICK
Bilirubin, UA: NEGATIVE
Blood, UA: NEGATIVE
Glucose, UA: NEGATIVE
Ketones, UA: NEGATIVE
Nitrite, UA: NEGATIVE
Protein, UA: NEGATIVE
Spec Grav, UA: 1.01 (ref 1.010–1.025)
Urobilinogen, UA: 0.2 E.U./dL
pH, UA: 6.5 (ref 5.0–8.0)

## 2020-10-20 MED ORDER — OXYCODONE HCL 5 MG PO CAPS: 5.0000 mg | ORAL_CAPSULE | Freq: Two times a day (BID) | ORAL | 0 refills | Status: DC | PRN

## 2020-10-20 MED ORDER — CEPHALEXIN 500 MG PO CAPS: 500.0000 mg | ORAL_CAPSULE | Freq: Three times a day (TID) | ORAL | 0 refills | Status: DC

## 2020-10-20 MED ORDER — TRAMADOL HCL 50 MG PO TABS: ORAL_TABLET | ORAL | 5 refills | Status: DC

## 2020-10-20 NOTE — Progress Notes (Signed)
This visit occurred during the SARS-CoV-2 public health emergency.  Safety protocols were in place, including screening questions prior to the visit, additional usage of staff PPE, and extensive cleaning of exam room while observing appropriate contact time as indicated for disinfecting solutions.    [name] , [birth year], 60 y.o., female MRN: [id number] Patient Care Team    Relationship Specialty Notifications Start End  [name], DO PCP - General Family Medicine  10/13/17   Constance Haw, MD PCP - Electrophysiology Cardiology Admissions 08/28/17   Satira Sark, MD PCP - Cardiology Cardiology Admissions 12/15/17   Rosalin Hawking, MD Consulting Physician Neurology  10/13/17   Jamse Arn, MD Consulting Physician Physical Medicine and Rehabilitation  10/13/17   Luanne Bras, MD Consulting Physician Interventional Radiology  10/13/17   Mansouraty, Telford Nab., MD Consulting Physician Gastroenterology  03/03/20   Armandina Gemma, MD Consulting Physician General Surgery  04/09/20   Lucas Mallow, MD Consulting Physician Urology  04/09/20     Chief Complaint  Patient presents with  . Fall    Pt had fallen forward on 1/22 and impacted left shoulder and left knee;     Subjective: Pt presents for an OV with complaints of injury with fall.  Patient states she walked out onto her porch 3 days ago, 10/17/2020, and fell when she attempted to sit on a chair on her porch.  She reports she just went to sit down but lost her balance and fell forward onto her left shoulder and bilateral knees.  She states she has bruised shins, knees, forearms, left chest and left shoulder.  She reports her left shoulder has been very painful.  She has not been able to even sleep at night secondary to laying flat causes increased pain.  She is unable to move her left arm and range of motion-secondary to shoulder pain.  She has never had a shoulder injury in the past. Patient's condition is  complicated by history of stroke affecting right upper and lower extremity use and chronic anticoagulated state.  Pt has tried icy hot and Voltaren gel to ease their symptoms.   Depression screen [facility] 2/9 07/13/2020 05/05/2020 11/22/2018 10/22/2018 10/22/2018  Decreased Interest 0 0 0 0 0  Down, Depressed, Hopeless 0 0 0 0 0  PHQ - 2 Score 0 0 0 0 0  Altered sleeping - - - 0 -  Tired, decreased energy - - - 1 -  Change in appetite - - - 0 -  Feeling bad or failure about yourself  - - - 0 -  Trouble concentrating - - - 0 -  Moving slowly or fidgety/restless - - - 0 -  Suicidal thoughts - - - 0 -  PHQ-9 Score - - - 1 -  Difficult doing work/chores - - - Not difficult at all -  Some recent data might be hidden    Allergies  Allergen Reactions  . Hydromorphone Other (See Comments)    Hallucinations Other reaction(s): Hallucination  . Lopressor [Metoprolol Tartrate] Swelling    Angioedema  . Sacubitril-Valsartan Swelling and Rash    Welts, possible angioedema  Rash  . Allegra Allergy [Fexofenadine Hcl] Hives  . Fexofenadine Hives  . Nsaids Other (See Comments)    On Xarelto Not taking d/t abdominal pain  . Gabapentin Rash  . Gabapentin (Once-Daily) Rash  . Lipitor [Atorvastatin] Diarrhea   Social History   Social History Narrative   Married. 2 children.  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    Walks with cane or wheelchair.    Former smoker.    Drinks caffeine.    Smoke alarm in the home.   Firearms in the home.    Wears her seatbelt.    Feels safe In her relationships.    Past Medical History:  Diagnosis Date  . AAA (abdominal aortic aneurysm) ([place])    a. 3.3cm infrarenal by CT 02/2020.  . Angio-edema   . Arthritis   . CAD (coronary artery disease)    a. s/p NSTEMI in 10/2017 with cath showing LM disease --> s/p CABG on 11/23/2017 with LIMA-LAD and SVG-OM.   [name] Carotid stenosis    a. Duplex 11/2018 [id number] RICA, [id number] LICA.  [name] Gastritis and duodenitis 12/06/2019    11/2019 Peptic duodenitis  - No dysplasia or malignancy identified  B. STOMACH, BIOPSY:  - Mild chronic gastritis with intestinal metaplasia   . Hay fever   . History of kidney stones   . Ischemic cardiomyopathy    a. EF 35-40% by echo in 10/2017. b. 55% by echo 01/2020.  . NSTEMI (non-ST elevated myocardial infarction) ([place])   . PAF (paroxysmal atrial fibrillation) ([place])    Diagnosed by ILR  . Pancreatitis   . Pilonidal cyst 10/21/2019  . Pneumonia 11/16/2017  . 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 Urticaria    Past Surgical History:  Procedure Laterality Date  . BIOPSY  12/02/2019   Procedure: BIOPSY;  Surgeon: Rush Landmark Telford Nab., MD;  Location: [place];  Service: Gastroenterology;;  . CHOLECYSTECTOMY N/A 04/30/2020   Procedure: LAPAROSCOPIC CHOLECYSTECTOMY WITH INTRAOPERATIVE CHOLANGIOGRAM;  Surgeon: Armandina Gemma, MD;  Location: [facility];  Service: General;  Laterality: N/A;  . CORONARY ARTERY BYPASS GRAFT N/A 11/23/2017   Procedure: CORONARY ARTERY BYPASS GRAFTING (CABG) x 2, ON PUMP, USING LEFT INTERNAL MAMMARY ARTERY AND RIGHT GREATER SAPHENOUS VEIN HARVESTED ENDOSCOPICALLY;  Surgeon: Gaye Pollack, MD;  Location: [place];  Service: Open Heart Surgery;  Laterality: N/A;  . CRYOABLATION     cervical  . ESOPHAGOGASTRODUODENOSCOPY (EGD) WITH PROPOFOL N/A 12/02/2019   Procedure: ESOPHAGOGASTRODUODENOSCOPY (EGD) WITH PROPOFOL;  Surgeon: Rush Landmark Telford Nab., MD;  Location: [place];  Service: Gastroenterology;  Laterality: N/A;  . ESOPHAGOGASTRODUODENOSCOPY (EGD) WITH PROPOFOL N/A 03/19/2020   Procedure: ESOPHAGOGASTRODUODENOSCOPY (EGD) WITH PROPOFOL;  Surgeon: Milus Banister, MD;  [facility];  Service: Endoscopy;  Laterality: N/A;  . EUS N/A 03/19/2020   Procedure: UPPER ENDOSCOPIC ULTRASOUND (EUS) RADIAL;  Surgeon: Milus Banister, MD;  [facility];  Service: Endoscopy;  Laterality: N/A;  . EUS N/A 03/19/2020    Procedure: UPPER ENDOSCOPIC ULTRASOUND (EUS) LINEAR;  Surgeon: Milus Banister, MD;  [facility];  Service: Endoscopy;  Laterality: N/A;  . FINE NEEDLE ASPIRATION  12/02/2019   Procedure: FINE NEEDLE ASPIRATION (FNA) LINEAR;  Surgeon: Irving Copas., MD;  Location: [place];  Service: Gastroenterology;;  . FINE NEEDLE ASPIRATION N/A 03/19/2020   Procedure: FINE NEEDLE ASPIRATION (FNA) LINEAR;  Surgeon: Milus Banister, MD;  [facility];  Service: Endoscopy;  Laterality: N/A;  . IR ANGIO INTRA EXTRACRAN SEL COM CAROTID INNOMINATE UNI R MOD SED  08/21/2017  . IR ANGIO VERTEBRAL SEL SUBCLAVIAN INNOMINATE UNI R MOD SED  08/21/2017  . IR PERCUTANEOUS ART THROMBECTOMY/INFUSION INTRACRANIAL INC DIAG ANGIO  08/21/2017  . IR RADIOLOGIST EVAL & MGMT  10/30/2017  . LOOP RECORDER INSERTION N/A 08/28/2017  Procedure: LOOP RECORDER INSERTION;  Surgeon: Constance Haw, MD;  Location: [place] [facility];  Service: Cardiovascular;  Laterality: N/A;  . RADIOLOGY WITH ANESTHESIA N/A 08/21/2017   Procedure: RADIOLOGY WITH ANESTHESIA;  Surgeon: Luanne Bras, MD;  Location: [place];  Service: Radiology;  Laterality: N/A;  . RIGHT/LEFT HEART CATH AND CORONARY ANGIOGRAPHY N/A 11/20/2017   Procedure: RIGHT/LEFT HEART CATH AND CORONARY ANGIOGRAPHY;  Surgeon: Jettie Booze, MD;  Location: [place] [facility];  Service: Cardiovascular;  Laterality: N/A;  . TEE WITHOUT CARDIOVERSION N/A 08/28/2017   Procedure: TRANSESOPHAGEAL ECHOCARDIOGRAM (TEE);  Surgeon: Fay Records, MD;  Location: [place];  Service: Cardiovascular;  Laterality: N/A;  . TEE WITHOUT CARDIOVERSION N/A 11/23/2017   Procedure: TRANSESOPHAGEAL ECHOCARDIOGRAM (TEE);  Surgeon: Gaye Pollack, MD;  Location: [place];  Service: Open Heart Surgery;  Laterality: N/A;  . TUBAL LIGATION    . UPPER ESOPHAGEAL ENDOSCOPIC ULTRASOUND (EUS) N/A 12/02/2019   Procedure: UPPER ESOPHAGEAL ENDOSCOPIC ULTRASOUND (EUS);  Surgeon:  Irving Copas., MD;  Location: [place];  Service: Gastroenterology;  Laterality: N/A;   Family History  Problem Relation Age of Onset  . Heart attack Father   . Hypertension Mother   . Hyperlipidemia Mother   . Diabetes type II Mother   . Urticaria Mother   . Hypertension Brother   . Breast cancer Cousin 22  . Immunodeficiency Neg Hx   . Eczema Neg Hx   . Atopy Neg Hx   . Asthma Neg Hx   . Angioedema Neg Hx   . Allergic rhinitis Neg Hx   . Colon cancer Neg Hx   . Esophageal cancer Neg Hx   . Stomach cancer Neg Hx   . Pancreatic cancer Neg Hx   . Colon polyps Neg Hx   . Esophageal varices Neg Hx   . Inflammatory bowel disease Neg Hx   . Rectal cancer Neg Hx    Allergies as of 10/20/2020      Reactions   Hydromorphone Other (See Comments)   Hallucinations Other reaction(s): Hallucination   Lopressor [metoprolol Tartrate] Swelling   Angioedema   Sacubitril-valsartan Swelling, Rash   Welts, possible angioedema Rash   Allegra Allergy [fexofenadine Hcl] Hives   Fexofenadine Hives   Nsaids Other (See Comments)   On Xarelto Not taking d/t abdominal pain   Gabapentin Rash   Gabapentin (once-daily) Rash   Lipitor [atorvastatin] Diarrhea      Medication List       Accurate as of October 20, 2020 11:51 AM. If you have any questions, ask your nurse or doctor.        STOP taking these medications   senna-docusate 8.6-50 MG tablet Commonly known as: Senokot-S Stopped by: Howard Pouch, DO     TAKE these medications   acetaminophen 325 MG tablet Commonly known as: TYLENOL Take 2 tablets (650 mg total) by mouth every 6 (six) hours as needed for mild pain or moderate pain (or Fever >/= 101). What changed: how much to take   baclofen 10 MG tablet Commonly known as: LIORESAL TAKE 1 TABLET BY MOUTH THREE TIMES DAILY . APPOINTMENT REQUIRED FOR FUTURE REFILLS What changed: See the new instructions.   calcium carbonate 500 MG chewable tablet Commonly known  as: TUMS - dosed in mg elemental calcium Chew 1 tablet by mouth 2 (two) times daily as needed for indigestion or heartburn.   carvedilol 3.125 MG tablet Commonly known as: COREG Take 1 tablet (3.125 mg total) by mouth  in the morning and at bedtime.   cholecalciferol 1000 units tablet Commonly known as: VITAMIN D Take 1,000 Units by mouth daily.   diclofenac Sodium 1 % Gel Commonly known as: VOLTAREN APPLY 2 GRAMS TOPICALLY 4 TIMES A DAY   ferrous sulfate 325 (65 FE) MG tablet Take 325 mg by mouth 2 (two) times a week. Mondays & Thursdays.   hydrOXYzine 10 MG tablet Commonly known as: ATARAX/VISTARIL TAKE 1 TABLET BY MOUTH AT BEDTIME   levocetirizine 5 MG tablet Commonly known as: XYZAL Take 1 tablet (5 mg total) by mouth every evening.   ondansetron 4 MG disintegrating tablet Commonly known as: Zofran ODT Take 1 tablet (4 mg total) by mouth every 8 (eight) hours as needed for nausea or vomiting.   oxycodone 5 MG capsule Commonly known as: OXY-IR Take 1 capsule (5 mg total) by mouth every 12 (twelve) hours as needed for pain. Started by: Howard Pouch, DO   pantoprazole 40 MG tablet Commonly known as: PROTONIX Take 1 tablet (40 mg total) by mouth daily.   pravastatin 20 MG tablet Commonly known as: PRAVACHOL TAKE 1 TABLET BY MOUTH ONCE DAILY IN THE EVENING   rivaroxaban 20 MG Tabs tablet Commonly known as: Xarelto Take 1 tablet daily with evening meal What changed:   how much to take  how to take this  when to take this  additional instructions   selenium 200 MCG Tabs tablet Take by mouth.   traMADol 50 MG tablet Commonly known as: ULTRAM TAKE 1 TO 2 TABLETS BY MOUTH EVERY 8 HOURS AS NEEDED FOR MODERATE PAIN What changed: See the new instructions.   triamcinolone 0.1 % Commonly known as: KENALOG Apply topically 4 (four) times daily.   vitamin B-12 100 MCG tablet Commonly known as: CYANOCOBALAMIN Take 100 mcg by mouth daily.       All past  medical history, surgical history, allergies, family history, immunizations andmedications were updated in the EMR today and reviewed under the history and medication portions of their EMR.     ROS: Negative, with the exception of above mentioned in HPI   Objective:  Pulse 72   Temp 98.1 F (36.7 C) (Oral)   Ht 5\' 1"  (1.549 m)   Wt 106 lb (48.1 kg)   LMP  (LMP Unknown) Comment: at age 36  SpO2 100%   BMI 20.03 kg/m  Body mass index is 20.03 kg/m. Gen: Afebrile. No acute distress. Nontoxic in appearance, well developed, well nourished.  HENT: AT. [place].  MSK (left shoulder): moderate swelling, bruising present. Pt very TTP on exam. ? Anterior dislocation of humeral head. Unable to abd/add ext without significant pain.  Bilateral knees: with yellow/green bruising over patella bilaterally. No joint line TTP. Full ROM. NV intact distally.  Skin: multiple abrasions and  bruising (yellow/green) bilateral lower ext and upper ext. No erythema or signs of infections.  Neuro: unchanged gait. PERLA. EOMi. Alert. Oriented x3 Psych: Normal affect, dress and demeanor. Normal speech. Normal thought content and judgment.  No exam data present No results found. Results for orders placed or performed in visit on 10/20/20 (from the past 24 hour(s))  POCT Urinalysis Dipstick     Status: Abnormal   Collection Time: 10/20/20 11:45 AM  Result Value Ref Range   Color, UA Dark Yellow    Clarity, UA Clear    Glucose, UA Negative Negative   Bilirubin, UA Negative    Ketones, UA Negative    Spec Grav, UA 1.010 1.010 -  1.025   Blood, UA Negative    pH, UA 6.5 5.0 - 8.0   Protein, UA Negative Negative   Urobilinogen, UA 0.2 0.2 or 1.0 E.U./dL   Nitrite, UA Negative    Leukocytes, UA Trace (A) Negative   Appearance Clear    Odor Yes     Assessment/Plan: LYASIA NORTH is a 60 y.o. female present for OV for  Fall, initial encounter/Shoulder injury, left, initial encounter/Acute pain of both  knees Fall 3 days ago- Her shoulder is rather significantly swollen and tender on exam today. She is unable to perform any special test or ROM. We discussed my concerns over fracture vs anterior dislocation vs rotator cuff tear.  - pt to wear sling for support- avoid movement of shoulder at this time.  - DG Shoulder Left; Future> STAT placed - urgent referral will be placed to ortho once we receive xray results.  Typically, pt only has full use of her left arm d/t to stroke affecting her right upper extremity.  It is of importance she regains full fx of left arm ASAP for ADLS. - pain: oxy 5 mg IR BID PRN. Tramadol (chronic) between doses if needed only.  - bilateral knees bruised, otherwise normal exam- do not suspect further injury. Pt encouraged to use Voltaren gel over knees.   Dysuria Pt reports urine odor and burning with urination of at least a week.  She denies fevers or chills. - POCT Urinalysis Dipstick - Urinalysis w microscopic + reflex cultur     Reviewed expectations re: course of current medical issues.  Discussed self-management of symptoms.  Outlined signs and symptoms indicating need for more acute intervention.  Patient verbalized understanding and all questions were answered.  Patient received an After-Visit Summary.    Orders Placed This Encounter  Procedures  . DG Shoulder Left  . Urinalysis w microscopic + reflex cultur  . POCT Urinalysis Dipstick   Meds ordered this encounter  Medications  . oxycodone (OXY-IR) 5 MG capsule    Sig: Take 1 capsule (5 mg total) by mouth every 12 (twelve) hours as needed for pain.    Dispense:  10 capsule    Refill:  0  . traMADol (ULTRAM) 50 MG tablet    Sig: TAKE 1 TO 2 TABLETS BY MOUTH EVERY 8 HOURS AS NEEDED FOR MODERATE PAIN    Dispense:  180 tablet    Refill:  5   Referral Orders  No referral(s) requested today    > 45 Minutes was dedicated to this patient's encounter to include pre-visit review of chart,  face-to-face time with patient and post-visit work- which include documentation and prescribing medications and/or ordering test when necessary.    Note is dictated utilizing voice recognition software. Although note has been proof read prior to signing, occasional typographical errors still can be missed. If any questions arise, please do not hesitate to call for verification.   electronically signed by:  Howard Pouch, DO  [place]

## 2020-10-20 NOTE — Telephone Encounter (Signed)
Spoke with pt regarding labs and instructions.   Please advise on referral ASAP. Thanks

## 2020-10-20 NOTE — Telephone Encounter (Signed)
Provider is already aware of results and informed pt

## 2020-10-20 NOTE — Patient Instructions (Signed)
Please have xray completed immediately. We will call you with results.   Try over the counter lidocaine patches for pain.  I also called in refills on your tramadol.  Oxycodone 5 mg before bed and at noon if needed. Use tramadol between if needed only.    Shoulder Dislocation Your shoulder joint is made up of 3 bones:  The upper arm bone (humerus).  The shoulder blade (scapula).  The collarbone (clavicle). A shoulder dislocation happens when your upper arm bone moves out of its normal place in your shoulder joint. What are the causes? This condition is often caused by:  A fall.  A hard, direct hit to the shoulder.  A forceful movement of the shoulder. What increases the risk? You are more likely to develop this condition if you play sports. What are the signs or symptoms?  Bad shape (deformity) of the shoulder.  Very bad pain.  A shoulder that you cannot move.  Numbness, weakness, or tingling in your neck or down your arm.  Bruising or swelling around your shoulder.   How is this treated? This condition is treated with a procedure called a reduction. This is done to place the upper arm bone back in the joint. There are two types of reduction:  Closed reduction. The upper arm bone is placed back in the joint without surgery. The doctor uses his or her hands to guide the bone back into place.  Open reduction. Surgery is done to place the upper arm bone back in the joint. This may be needed if: ? You have a weak shoulder joint or weak tissues that connect bones to each other (ligaments). ? You have had more than one shoulder dislocation. ? The nerves or blood vessels around your shoulder have been damaged. After the procedure, you will wear a brace or sling to prevent the arm from moving. After the brace or sling is removed, you will have physical therapy to help improve movement (range of motion) in your shoulder joint. Follow these instructions at  home: Medicines  Take over-the-counter and prescription medicines only as told by your doctor.  Ask your doctor if the medicine prescribed to you: ? Requires you to avoid driving or using heavy machinery. ? Can cause trouble pooping (constipation). You may need to take steps to prevent or treat trouble pooping:  Drink enough fluid to keep your pee (urine) pale yellow.  Take over-the-counter or prescription medicines.  Eat foods that are high in fiber. These include beans, whole grains, and fresh fruits and vegetables.  Limit foods that are high in fat and sugar. These include fried or sweet foods. If you have a brace or sling:  Wear the brace or sling as told by your doctor. Remove it only as told by your doctor.  Loosen the brace or sling if your fingers: ? Tingle. ? Become numb. ? Turn cold or blue.  Keep the brace or sling clean.  If the brace or sling is not waterproof: ? Do not let it get wet. ? Cover it with a watertight covering when you take a bath or shower. Managing pain, stiffness, and swelling  If told, put ice on the injured area. ? If you can remove your brace or sling, remove it as told by your doctor. ? Put ice in a plastic bag. ? Place a towel between your skin and the bag. ? Leave the ice on for 20 minutes, 2-3 times per day.  Move your fingers often.  Raise (  elevate) the injured area above the level of your heart while you are sitting or lying down.   Activity  Do not lift your arm above shoulder level until your doctor approves.  Do not lift anything until your doctor says that it is safe.  Do not push or pull things until your doctor approves.  Return to your normal activities as told by your doctor. Ask your doctor what activities are safe for you.  Do range-of-motion exercises only as told by your doctor.  Exercise your hand by squeezing a soft ball. This keeps your hand and wrist from getting stiff and swollen. General instructions  Do  not drive while you are wearing a brace or sling on a hand that you use for driving.  Do not take baths, swim, or use a hot tub until your doctor approves. Ask your doctor if you may take showers. You may only be allowed to take sponge baths.  Do not use any tobacco products, including cigarettes, chewing tobacco, or e-cigarettes. These can delay healing. If you need help quitting, ask your doctor.  Keep all follow-up visits as told by your doctor. This is important. Contact a doctor if:  Your brace or sling gets damaged. Get help right away if:  Your pain gets worse, not better.  You lose feeling in your arm or hand.  Your arm or hand turns white and cold. Summary  A shoulder dislocation happens when your upper arm bone moves out of its normal place in your shoulder joint.  It is often caused by a fall, a strong hit to the shoulder, or a forceful movement of the shoulder.  It causes very bad pain. You may not be able to move your shoulder.  This condition is treated with either closed or open reduction. You will also be given a brace or sling. You will do exercises to improve movement in your shoulder joint.  Contact a doctor if your brace or sling gets damaged. Get help right away if your pain gets worse, you lose feeling in your arm or hand, or your arm or hand turns white or cold. This information is not intended to replace advice given to you by your health care provider. Make sure you discuss any questions you have with your health care provider. Document Revised: 04/11/2018 Document Reviewed: 04/11/2018 Elsevier Patient Education  2021 [place] Cuff Tear  A rotator cuff tear is a partial or complete tear of the cord-like bands (tendons) that connect muscle to bone in the rotator cuff. The rotator cuff is a group of muscles and tendons that surround the shoulder joint and keep the upper arm bone (humerus) in the shoulder socket. The tear can occur suddenly  (acute tear) or can develop over a long period of time (chronic tear). What are the causes? Acute tears may be caused by:  A fall, especially on an outstretched arm.  Lifting very heavy objects with a jerking motion. Chronic tears may be caused by overuse of the muscles. This may happen in sports, physical work, or activities in which your arm repeatedly moves over your head. What increases the risk? This condition is more likely to occur in:  Athletes and workers who frequently use their shoulder or reach over their head. This may include activities such as: ? Tennis. ? Baseball and softball. ? Swimming and rowing. ? Weight lifting. ? Architect work. ? Painting.  People who smoke.  Older people who have arthritis or poor  blood supply. These can make the muscles and tendons weaker. What are the signs or symptoms? Symptoms of this condition depend on the type and severity of the injury:  An acute tear may include a sudden tearing feeling, followed by severe pain that goes from your upper shoulder, down your arm, and toward your elbow.  A chronic tear includes a gradual weakness and decreased shoulder motion as the pain gets worse. The pain is usually worse at night. Both types may have symptoms such as:  Pain that spreads (radiates) from the shoulder to the upper arm.  Swelling and tenderness in front of the shoulder.  Decreased range of motion.  Pain when: ? Reaching, pulling, or lifting the arm above the head. ? Lowering the arm from above the head.  Not being able to raise your arm out to the side.  Difficulty placing the arm behind your back. How is this diagnosed? This condition is diagnosed with a medical history and physical exam. Imaging tests may also be done, including:  X-rays.  MRI.  Ultrasound.  CT or MR arthrogram. During this test, a contrast material is injected into your shoulder, and then images are taken. How is this treated? Treatment for  this condition depends on the type and severity of the condition. In less severe cases, treatment may include:  Rest. This may be done with a sling that holds the shoulder still (immobilization). Your health care provider may also recommend avoiding activities that involve lifting your arm over your head.  Icing the shoulder.  Anti-inflammatory medicines, such as aspirin or ibuprofen.  Strengthening and stretching exercises. Your health care provider may recommend specific exercises to improve your range of motion and strengthen your shoulder. In more severe cases, treatment may include:  Physical therapy.  Steroid injections.  Surgery. Follow these instructions at home: Managing pain, stiffness, and swelling  If directed, put ice on the injured area. To do this: ? If you have a removable sling, remove it as told by your health care provider. ? Put ice in a plastic bag. ? Place a towel between your skin and the bag. ? Leave the ice on for 20 minutes, 2-3 times a day. ? Remove the ice if your skin turns bright red. This is very important. If you cannot feel pain, heat, or cold, you have a greater risk of damage to the area.  Raise (elevate) the injured area above the level of your heart while you are lying down.  Find a comfortable sleeping position or sleep on a recliner, if available.  Move your fingers often to reduce stiffness and swelling.  Once the swelling has gone down, your health care provider may direct you to apply heat to relax the muscles. Use the heat source that your health care provider recommends, such as a moist heat pack or a heating pad. ? Place a towel between your skin and the heat source. ? Leave the heat on for 20-30 minutes. ? Remove the heat if your skin turns bright red. This is especially important if you are unable to feel pain, heat, or cold. You have a greater risk of getting burned.      If you have a sling:  Wear the sling as told by your  health care provider. Remove it only as told by your health care provider.  Loosen the sling if your fingers tingle, become numb, or turn cold and blue.  Keep the sling clean.  If the sling is not waterproof: ?  Do not let it get wet. ? Cover it with a watertight covering when you take a bath or a shower. Activity  Rest your shoulder as told by your health care provider.  Return to your normal activities as told by your health care provider. Ask your health care provider what activities are safe for you.  Ask your health care provider when it is safe to drive if you have a sling on your arm.  Do any exercises or stretches as told by your health care provider. General instructions  Take over-the-counter and prescription medicines only as told by your health care provider.  Do not use any products that contain nicotine or tobacco, such as cigarettes, e-cigarettes, and chewing tobacco. If you need help quitting, ask your health care provider.  Keep all follow-up visits. This is important. Contact a health care provider if:  Your pain gets worse.  You have new pain in your arm, hands, or fingers.  Medicine does not help your pain. Get help right away if:  Your arm, hand, or fingers are numb or tingling.  Your arm, hand, or fingers are swollen or painful or they turn white or blue.  Your hand or fingers on your injured arm are colder than your other hand. Summary  A rotator cuff tear is a partial or complete tear of the cord-like bands (tendons) that connect muscle to bone in the rotator cuff.  The tear can occur suddenly (acute tear) or can develop over a long period of time (chronic tear).  Treatment generally includes rest, anti-inflammatory medicines, and icing. In some cases, physical therapy and steroid injections may be needed. In severe cases, surgery may be needed. This information is not intended to replace advice given to you by your health care provider. Make sure  you discuss any questions you have with your health care provider. Document Revised: 01/15/2020 Document Reviewed: 01/15/2020 Elsevier Patient Education  2021 [employer].

## 2020-10-20 NOTE — Telephone Encounter (Signed)
[name] with [facility] called to give report on today's Xray. [name] stated she could not leave information in a message, she needs to speak to someone. Her callback number is [phone].

## 2020-10-20 NOTE — Telephone Encounter (Signed)
Spoke with [name] to confirm the reason of call. Provider and pt is already aware

## 2020-10-20 NOTE — Telephone Encounter (Signed)
Please inform patient the following: Her  has a fracture of her clavicle bone/collarbone on the left.  Her shoulder is not dislocated.  Take medicines as prescribed.  Continue to wear the arm sling.  Avoid range of motion upper arm at this time until seen by orthopedics.  I have put in an urgent orthopedics referral to evaluate clavicle fracture and possible rotator cuff injury.   As far as her urine it did not appear overtly infectious, a few white cells were noted.  I have called in Keflex 3 times daily for her to take for 7 days to start potential treatment of UTI way we await on cultures.     Please make sure whoever is placing referrals is inform this is urgent.  Thanks

## 2020-10-21 ENCOUNTER — Other Ambulatory Visit: Payer: Self-pay | Admitting: Family Medicine

## 2020-10-22 ENCOUNTER — Encounter: Payer: 59 | Admitting: Cardiology

## 2020-10-23 ENCOUNTER — Telehealth: Payer: Self-pay | Admitting: Family Medicine

## 2020-10-23 LAB — URINALYSIS W MICROSCOPIC + REFLEX CULTURE
Bilirubin Urine: NEGATIVE
Glucose, UA: NEGATIVE
Hgb urine dipstick: NEGATIVE
Hyaline Cast: NONE SEEN /LPF
Ketones, ur: NEGATIVE
Nitrites, Initial: NEGATIVE
Protein, ur: NEGATIVE
RBC / HPF: NONE SEEN /HPF (ref 0–2)
Specific Gravity, Urine: 1.009 (ref 1.001–1.03)
pH: 7.5 (ref 5.0–8.0)

## 2020-10-23 LAB — URINE CULTURE
MICRO NUMBER:: 11455552
SPECIMEN QUALITY:: ADEQUATE

## 2020-10-23 LAB — CULTURE INDICATED

## 2020-10-23 NOTE — Telephone Encounter (Signed)
Patient advised and voiced understanding.  

## 2020-10-23 NOTE — Telephone Encounter (Signed)
Please inform patient her urine culture resulted with an E. coli urinary infection.  The antibiotic prescribed to her at her appointment will cover this appropriately.  Ensure to take Keflex 3 times a day until completed.

## 2020-10-29 ENCOUNTER — Encounter: Payer: 59 | Admitting: Cardiology

## 2020-11-24 ENCOUNTER — Other Ambulatory Visit: Payer: Self-pay | Admitting: Cardiology

## 2020-11-24 ENCOUNTER — Other Ambulatory Visit: Payer: Self-pay | Admitting: Physical Medicine & Rehabilitation

## 2020-12-21 ENCOUNTER — Other Ambulatory Visit: Payer: Self-pay

## 2020-12-21 ENCOUNTER — Encounter: Payer: Self-pay | Admitting: Cardiology

## 2020-12-21 ENCOUNTER — Ambulatory Visit (INDEPENDENT_AMBULATORY_CARE_PROVIDER_SITE_OTHER): Payer: 59 | Admitting: Cardiology

## 2020-12-21 VITALS — BP 102/50 | HR 88 | Ht 61.0 in | Wt 110.0 lb

## 2020-12-21 DIAGNOSIS — I480 Paroxysmal atrial fibrillation: Secondary | ICD-10-CM | POA: Diagnosis not present

## 2020-12-21 DIAGNOSIS — R002 Palpitations: Secondary | ICD-10-CM

## 2020-12-21 NOTE — Progress Notes (Signed)
Electrophysiology Office Note   Date:  12/21/2020   ID:  [name], DOB 06-Jan-1961, MRN [id number]  PCP:  Natalia Leatherwood, DO  Cardiologist:  Diona Browner Primary Electrophysiologist:  Aquan Kope Jorja Loa, MD    No chief complaint on file.    History of Present Illness: [name] is a 60 y.o. female who is being seen today for the evaluation of atrial fibrillation at the request of Nona Dell. Presenting today for electrophysiology evaluation.    She has a past history of CVA, tobacco abuse, coronary artery disease, and heart failure.  She is status post CABG 11/23/2017 x 2.  Today, denies symptoms of palpitations, chest pain, shortness of breath, orthopnea, PND, lower extremity edema, claudication, dizziness, presyncope, syncope, bleeding, or neurologic sequela. The patient is tolerating medications without difficulties.  She overall feels well.  She has no chest pain or shortness of breath.  She does feel some twinges in the left side of her chest that lasts up to a second.  Aside from that, she has no major complaints.   Past Medical History:  Diagnosis Date  . AAA (abdominal aortic aneurysm) (HCC)    a. 3.3cm infrarenal by CT 02/2020.  . Angio-edema   . Arthritis   . CAD (coronary artery disease)    a. s/p NSTEMI in 10/2017 with cath showing LM disease --> s/p CABG on 11/23/2017 with LIMA-LAD and SVG-OM.   [name] Carotid stenosis    a. Duplex 11/2018 1-39% RICA, 40-59% LICA.  [name] Gastritis and duodenitis 12/06/2019   11/2019 Peptic duodenitis  - No dysplasia or malignancy identified  B. STOMACH, BIOPSY:  - Mild chronic gastritis with intestinal metaplasia   . Hay fever   . History of kidney stones   . Ischemic cardiomyopathy    a. EF 35-40% by echo in 10/2017. b. 55% by echo 01/2020.  . NSTEMI (non-ST elevated myocardial infarction) (HCC)   . PAF (paroxysmal atrial fibrillation) (HCC)    Diagnosed by ILR  . Pancreatitis   . Pilonidal cyst 10/21/2019  . Pneumonia  11/16/2017  . Stroke [facility]) 08/23/2017   Left MCA infarct status post TPA and thrombectomy  . Urticaria    Past Surgical History:  Procedure Laterality Date  . BIOPSY  12/02/2019   Procedure: BIOPSY;  Surgeon: Meridee Score Netty Starring., MD;  Location: [facility] ENDOSCOPY;  Service: Gastroenterology;;  . CHOLECYSTECTOMY N/A 04/30/2020   Procedure: LAPAROSCOPIC CHOLECYSTECTOMY WITH INTRAOPERATIVE CHOLANGIOGRAM;  Surgeon: Darnell Level, MD;  Location: [facility] ORS;  Service: General;  Laterality: N/A;  . CORONARY ARTERY BYPASS GRAFT N/A 11/23/2017   Procedure: CORONARY ARTERY BYPASS GRAFTING (CABG) x 2, ON PUMP, USING LEFT INTERNAL MAMMARY ARTERY AND RIGHT GREATER SAPHENOUS VEIN HARVESTED ENDOSCOPICALLY;  Surgeon: Alleen Borne, MD;  Location: [facility] OR;  Service: Open Heart Surgery;  Laterality: N/A;  . CRYOABLATION     cervical  . ESOPHAGOGASTRODUODENOSCOPY (EGD) WITH PROPOFOL N/A 12/02/2019   Procedure: ESOPHAGOGASTRODUODENOSCOPY (EGD) WITH PROPOFOL;  Surgeon: Meridee Score Netty Starring., MD;  Location: [facility] ENDOSCOPY;  Service: Gastroenterology;  Laterality: N/A;  . ESOPHAGOGASTRODUODENOSCOPY (EGD) WITH PROPOFOL N/A 03/19/2020   Procedure: ESOPHAGOGASTRODUODENOSCOPY (EGD) WITH PROPOFOL;  Surgeon: Rachael Fee, MD;  Location: [facility] ENDOSCOPY;  Service: Endoscopy;  Laterality: N/A;  . EUS N/A 03/19/2020   Procedure: UPPER ENDOSCOPIC ULTRASOUND (EUS) RADIAL;  Surgeon: Rachael Fee, MD;  Location: [facility] ENDOSCOPY;  Service: Endoscopy;  Laterality: N/A;  . EUS N/A 03/19/2020   Procedure: UPPER ENDOSCOPIC ULTRASOUND (EUS) LINEAR;  Surgeon: Christella Hartigan,  Melton Alar, MD;  Location: [facility] ENDOSCOPY;  Service: Endoscopy;  Laterality: N/A;  . FINE NEEDLE ASPIRATION  12/02/2019   Procedure: FINE NEEDLE ASPIRATION (FNA) LINEAR;  Surgeon: Lemar Lofty., MD;  Location: [facility] ENDOSCOPY;  Service: Gastroenterology;;  . FINE NEEDLE ASPIRATION N/A 03/19/2020   Procedure: FINE NEEDLE ASPIRATION (FNA) LINEAR;  Surgeon: Rachael Fee, MD;   Location: [facility] ENDOSCOPY;  Service: Endoscopy;  Laterality: N/A;  . IR ANGIO INTRA EXTRACRAN SEL COM CAROTID INNOMINATE UNI R MOD SED  08/21/2017  . IR ANGIO VERTEBRAL SEL SUBCLAVIAN INNOMINATE UNI R MOD SED  08/21/2017  . IR PERCUTANEOUS ART THROMBECTOMY/INFUSION INTRACRANIAL INC DIAG ANGIO  08/21/2017  . IR RADIOLOGIST EVAL & MGMT  10/30/2017  . LOOP RECORDER INSERTION N/A 08/28/2017   Procedure: LOOP RECORDER INSERTION;  Surgeon: Regan Lemming, MD;  Location: [facility] INVASIVE CV LAB;  Service: Cardiovascular;  Laterality: N/A;  . RADIOLOGY WITH ANESTHESIA N/A 08/21/2017   Procedure: RADIOLOGY WITH ANESTHESIA;  Surgeon: Julieanne Cotton, MD;  Location: [facility] OR;  Service: Radiology;  Laterality: N/A;  . RIGHT/LEFT HEART CATH AND CORONARY ANGIOGRAPHY N/A 11/20/2017   Procedure: RIGHT/LEFT HEART CATH AND CORONARY ANGIOGRAPHY;  Surgeon: Corky Crafts, MD;  Location: [facility] INVASIVE CV LAB;  Service: Cardiovascular;  Laterality: N/A;  . TEE WITHOUT CARDIOVERSION N/A 08/28/2017   Procedure: TRANSESOPHAGEAL ECHOCARDIOGRAM (TEE);  Surgeon: Pricilla Riffle, MD;  Location: [facility] ENDOSCOPY;  Service: Cardiovascular;  Laterality: N/A;  . TEE WITHOUT CARDIOVERSION N/A 11/23/2017   Procedure: TRANSESOPHAGEAL ECHOCARDIOGRAM (TEE);  Surgeon: Alleen Borne, MD;  Location: [facility] OR;  Service: Open Heart Surgery;  Laterality: N/A;  . TUBAL LIGATION    . UPPER ESOPHAGEAL ENDOSCOPIC ULTRASOUND (EUS) N/A 12/02/2019   Procedure: UPPER ESOPHAGEAL ENDOSCOPIC ULTRASOUND (EUS);  Surgeon: Lemar Lofty., MD;  Location: [facility] ENDOSCOPY;  Service: Gastroenterology;  Laterality: N/A;     Current Outpatient Medications  Medication Sig Dispense Refill  . acetaminophen (TYLENOL) 325 MG tablet Take 2 tablets (650 mg total) by mouth every 6 (six) hours as needed for mild pain or moderate pain (or Fever >/= 101). (Patient taking differently: Take 325 mg by mouth every 6 (six) hours as needed for mild pain or moderate pain (or Fever  >/= 101).)    . baclofen (LIORESAL) 10 MG tablet TAKE 1 TABLET BY MOUTH THREE TIMES DAILY . APPOINTMENT REQUIRED FOR FUTURE REFILLS 90 tablet 0  . calcium carbonate (TUMS - DOSED IN MG ELEMENTAL CALCIUM) 500 MG chewable tablet Chew 1 tablet by mouth 2 (two) times daily as needed for indigestion or heartburn.     . carvedilol (COREG) 3.125 MG tablet Take 1 tablet (3.125 mg total) by mouth in the morning and at bedtime. 180 tablet 3  . cephALEXin (KEFLEX) 500 MG capsule Take 1 capsule (500 mg total) by mouth 3 (three) times daily. 21 capsule 0  . cholecalciferol (VITAMIN D) 1000 units tablet Take 1,000 Units by mouth daily.    . diclofenac Sodium (VOLTAREN) 1 % GEL APPLY 2 GRAMS TOPICALLY 4 TIMES A DAY 300 g 2  . ferrous sulfate 325 (65 FE) MG tablet Take 325 mg by mouth 2 (two) times a week. Mondays & Thursdays.    . hydrOXYzine (ATARAX/VISTARIL) 10 MG tablet TAKE 1 TABLET BY MOUTH AT BEDTIME 90 tablet 1  . levocetirizine (XYZAL) 5 MG tablet Take 1 tablet (5 mg total) by mouth every evening. 180 tablet 1  . ondansetron (ZOFRAN ODT) 4 MG disintegrating tablet Take 1 tablet (  4 mg total) by mouth every 8 (eight) hours as needed for nausea or vomiting. 30 tablet 0  . oxycodone (OXY-IR) 5 MG capsule Take 1 capsule (5 mg total) by mouth every 12 (twelve) hours as needed for pain. 10 capsule 0  . pantoprazole (PROTONIX) 40 MG tablet Take 1 tablet by mouth once daily 90 tablet 1  . pravastatin (PRAVACHOL) 20 MG tablet TAKE 1 TABLET BY MOUTH ONCE DAILY IN THE EVENING 90 tablet 1  . rivaroxaban (XARELTO) 20 MG TABS tablet Take 1 tablet daily with evening meal (Patient taking differently: Take 20 mg by mouth daily with supper.) 30 tablet 11  . selenium 200 MCG TABS tablet Take by mouth.    . traMADol (ULTRAM) 50 MG tablet TAKE 1 TO 2 TABLETS BY MOUTH EVERY 8 HOURS AS NEEDED FOR MODERATE PAIN 180 tablet 5  . triamcinolone cream (KENALOG) 0.1 % Apply topically 4 (four) times daily. 30 g 0  . vitamin B-12  (CYANOCOBALAMIN) 100 MCG tablet Take 100 mcg by mouth daily.     No current facility-administered medications for this visit.    Allergies:   Hydromorphone, Lopressor [metoprolol tartrate], Sacubitril-valsartan, Allegra allergy [fexofenadine hcl], Fexofenadine, Nsaids, Gabapentin, Gabapentin (once-daily), and Lipitor [atorvastatin]   Social History:  The patient  reports that she has been smoking cigarettes. She has a 3.70 pack-year smoking history. She has never used smokeless tobacco. She reports that she does not drink alcohol and does not use drugs.   Family History:  The patient's family history includes Breast cancer (age of onset: 24) in her cousin; Diabetes type II in her mother; Heart attack in her father; Hyperlipidemia in her mother; Hypertension in her brother and mother; Urticaria in her mother.   ROS:  Please see the history of present illness.   Otherwise, review of systems is positive for none.   All other systems are reviewed and negative.   PHYSICAL EXAM: VS:  BP (!) 102/50   Pulse 88   Ht 5\' 1"  (1.549 m)   Wt 110 lb (49.9 kg)   LMP  (LMP Unknown) Comment: at age 59  BMI 20.78 kg/m  , BMI Body mass index is 20.78 kg/m. GEN: Well nourished, well developed, in no acute distress  HEENT: normal  Neck: no JVD, carotid bruits, or masses Cardiac: RRR; no murmurs, rubs, or gallops,no edema  Respiratory:  clear to auscultation bilaterally, normal work of breathing GI: soft, nontender, nondistended, + BS MS: no deformity or atrophy  Skin: warm and dry, device site well healed Neuro:  Strength and sensation are intact Psych: euthymic mood, full affect  EKG:  EKG is ordered today. Personal review of the ekg ordered shows sinus rhythm, PVC  Personal review of the device interrogation today. Results in [facility]   Recent Labs: 03/18/2020: Magnesium 2.0 06/18/2020: ALT 10; Hemoglobin 12.4; Platelets 242 06/19/2020: BUN 6; Creatinine, Ser 0.47; Potassium 3.5; Sodium  137 07/15/2020: TSH 2.22    Lipid Panel     Component Value Date/Time   CHOL 203 (H) 09/11/2019 0453   TRIG 85 09/11/2019 0453   HDL 28 (L) 09/11/2019 0453   CHOLHDL 7.3 09/11/2019 0453   VLDL 17 09/11/2019 0453   LDLCALC 158 (H) 09/11/2019 0453     Wt Readings from Last 3 Encounters:  12/21/20 110 lb (49.9 kg)  10/20/20 106 lb (48.1 kg)  10/16/20 108 lb (49 kg)      Other studies Reviewed: Additional studies/ records that were reviewed today include: TTE  08/01/2019 Review of the above records today demonstrates:   1. Left ventricular ejection fraction, by visual estimation, is 50 to 55%. The left ventricle has low normal function. There is no left ventricular hypertrophy.  2. Left ventricular diastolic parameters are consistent with Grade I diastolic dysfunction (impaired relaxation).  3. Hypokinesis/ akinesis of the basal inferior/inferior walls.  4. Global right ventricle has normal systolic function.The right ventricular size is normal. No increase in right ventricular wall thickness.  5. Left atrial size was normal.  6. Right atrial size was normal.  7. The mitral valve is normal in structure. No evidence of mitral valve regurgitation.  8. The tricuspid valve is normal in structure. Tricuspid valve regurgitation is not demonstrated.  9. The aortic valve is tricuspid. Aortic valve regurgitation is not visualized. Mild to moderate aortic valve sclerosis/calcification without any evidence of aortic stenosis. 10. The pulmonic valve was not well visualized. Pulmonic valve regurgitation is not visualized.    ASSESSMENT AND PLAN:  1.  Coronary artery disease: Status post two-vessel bypass.  No current chest pain.  2.  Chronic systolic and diastolic heart failure: Currently feeling well without evidence of volume overload.  3.  Paroxysmal atrial fibrillation: Likely the cause of her CVA.  Currently on Xarelto with a CHA2DS2-VASc of 5.  Sinus rhythm today.  4.  PVCs:  Patient is in ventricular bigeminy today in clinic.  We do not have an accurate representation of her PVC burden.  We Trinidy Masterson have her wear a 3-day monitor.    Current medicines are reviewed at length with the patient today.   The patient does not have concerns regarding her medicines.  The following changes were made today: None  Labs/ tests ordered today include:  Orders Placed This Encounter  Procedures  . LONG TERM MONITOR (3-14 DAYS)  . EKG 12-Lead     Disposition:   FU with Yerick Eggebrecht 12 months  Signed, Mariel Gaudin Jorja Loa, MD  12/21/2020 2:38 PM     [facility] [address] [place] [address] [phone] (office) [phone] (fax)

## 2020-12-21 NOTE — Patient Instructions (Signed)
Medication Instructions:  Your physician recommends that you continue on your current medications as directed. Please refer to the Current Medication list given to you today.  *If you need a refill on your cardiac medications before your next appointment, please call your pharmacy*   Lab Work: None ordered   Testing/Procedures:                          ZIO XT- Long Term Monitor Instructions   Your physician has requested you wear your ZIO patch monitor for 3 days.   This is a single patch monitor.  [employer] supplies one patch monitor per enrollment.  Additional stickers are not available.   Please do not apply patch if you will be having a Nuclear Stress Test, Echocardiogram, Cardiac CT, MRI, or Chest Xray during the time frame you would be wearing the monitor. The patch cannot be worn during these tests.  You cannot remove and re-apply the ZIO XT patch monitor.   Your ZIO patch monitor will be sent USPS Priority mail from [facility] directly to your home address. The monitor may also be mailed to a PO BOX if home delivery is not available.   It may take 3-5 days to receive your monitor after you have been enrolled.   Once you have received you monitor, please review enclosed instructions.  Your monitor has already been registered assigning a specific monitor serial # to you.   Applying the monitor   Shave hair from upper left chest.   Hold abrader disc by orange tab.  Rub abrader in 40 strokes over left upper chest as indicated in your monitor instructions.   Clean area with 4 enclosed alcohol pads .  Use all pads to assure are is cleaned thoroughly.  Let dry.   Apply patch as indicated in monitor instructions.  Patch will be place under collarbone on left side of chest with arrow pointing upward.   Rub patch adhesive wings for 2 minutes.Remove white label marked "1".  Remove white label marked "2".  Rub patch adhesive wings for 2 additional minutes.   While looking in a  mirror, press and release button in center of patch.  A small green light will flash 3-4 times .  This will be your only indicator the monitor has been turned on.     Do not shower for the first 24 hours.  You may shower after the first 24 hours.   Press button if you feel a symptom. You will hear a small click.  Record Date, Time and Symptom in the Patient Log Book.   When you are ready to remove patch, follow instructions on last 2 pages of Patient Log Book.  Stick patch monitor onto last page of Patient Log Book.   Place Patient Log Book in [place] box.  Use locking tab on box and tape box closed securely.  The [employer] and [employer] has [employer] on it.  Please place in mailbox as soon as possible.  Your physician should have your test results approximately 7 days after the monitor has been mailed back to [facility].   Call [place] at [phone] if you have questions regarding your ZIO XT patch monitor.  Call them immediately if you see an orange light blinking on your monitor.   If your monitor falls off in less than 4 days contact our Monitor department at [phone].  If your monitor becomes loose or falls  off after 4 days call [employer] at [phone] for suggestions on securing your monitor.     Follow-Up: At [facility], you and your health needs are our priority.  As part of our continuing mission to provide you with exceptional heart care, we have created designated Provider Care Teams.  These Care Teams include your primary Cardiologist (physician) and Advanced Practice Providers (APPs -  Physician Assistants and Nurse Practitioners) who all work together to provide you with the care you need, when you need it.  We recommend signing up for the patient portal called "MyChart".  Sign up information is provided on this After Visit Summary.  MyChart is used to connect with patients for Virtual Visits (Telemedicine).  Patients are able to view lab/test  results, encounter notes, upcoming appointments, etc.  Non-urgent messages can be sent to your provider as well.   To learn more about what you can do with MyChart, go to [email or web address].    Remote monitoring is used to monitor your Pacemaker or ICD from home. This monitoring reduces the number of office visits required to check your device to one time per year. It allows [place] to keep an eye on the functioning of your device to ensure it is working properly. You are scheduled for a device check from home on 01/04/2021. You may send your transmission at any time that day. If you have a wireless device, the transmission will be sent automatically. After your physician reviews your transmission, you will receive a postcard with your next transmission date.  Your next appointment:   1 year(s)  The format for your next appointment:   In Person  Provider:   Allegra Lai, MD   Thank you for choosing [facility]!!   Trinidad Curet, RN [phone]    Other Instructions

## 2020-12-22 ENCOUNTER — Telehealth: Payer: Self-pay | Admitting: *Deleted

## 2020-12-22 ENCOUNTER — Other Ambulatory Visit: Payer: Self-pay | Admitting: Family Medicine

## 2020-12-22 ENCOUNTER — Ambulatory Visit: Payer: 59

## 2020-12-22 DIAGNOSIS — R002 Palpitations: Secondary | ICD-10-CM

## 2020-12-22 NOTE — Telephone Encounter (Signed)
Dr. Curt Bears ordered a 3 day holter monitor on patient.  Instructions for [employer] ZIO XT patch were given to patient.  [employer]/ ZIO is not in network with [employer].  Patient will be enrolled for a company called [employer] , (who is in network with [place]), to ship a 3 day holter monitor to her home via [place].  Instructions included in monitor kit.

## 2020-12-25 ENCOUNTER — Other Ambulatory Visit: Payer: Self-pay | Admitting: *Deleted

## 2020-12-25 ENCOUNTER — Telehealth: Payer: Self-pay | Admitting: *Deleted

## 2020-12-25 MED ORDER — BACLOFEN 10 MG PO TABS: ORAL_TABLET | ORAL | 0 refills | Status: DC

## 2020-12-25 NOTE — Telephone Encounter (Signed)
[name] called about her wrist pain.  Per your last note you said would do CTS injection.  She will schedule but it will be into May. She requests to be put on cancellation list. She was never able to get the lyrica because insurance denied it.

## 2020-12-25 NOTE — Telephone Encounter (Signed)
Thank you :)

## 2021-01-05 NOTE — Telephone Encounter (Signed)
Patient is requesting to speak with Dr. Curt Bears' nurse. She wants to make her aware she has been wearing her Zio monitor since 01/01/21 around 10:00 AM. She would like to discuss to ensure she is doing everything correctly.

## 2021-01-05 NOTE — Telephone Encounter (Signed)
Pt advised as long as she has worn it for 3 days, then she can remove it and mail it back per instructions. Patient verbalized understanding and agreeable to plan.

## 2021-01-19 ENCOUNTER — Encounter: Payer: Self-pay | Admitting: Internal Medicine

## 2021-01-19 ENCOUNTER — Ambulatory Visit (INDEPENDENT_AMBULATORY_CARE_PROVIDER_SITE_OTHER): Payer: 59 | Admitting: Internal Medicine

## 2021-01-19 ENCOUNTER — Other Ambulatory Visit: Payer: Self-pay

## 2021-01-19 ENCOUNTER — Other Ambulatory Visit: Payer: Self-pay | Admitting: Family Medicine

## 2021-01-19 VITALS — BP 110/62 | HR 97 | Ht 61.0 in | Wt 112.6 lb

## 2021-01-19 DIAGNOSIS — E063 Autoimmune thyroiditis: Secondary | ICD-10-CM | POA: Diagnosis not present

## 2021-01-19 LAB — T3, FREE: T3, Free: 3.2 pg/mL (ref 2.3–4.2)

## 2021-01-19 LAB — T4, FREE: Free T4: 0.84 ng/dL (ref 0.60–1.60)

## 2021-01-19 LAB — TSH: TSH: 4.67 u[IU]/mL — ABNORMAL HIGH (ref 0.35–4.50)

## 2021-01-19 NOTE — Progress Notes (Signed)
Patient ID: [name], female   DOB: October 18, 1960, 60 y.o.   MRN: [id number]   This visit occurred during the SARS-CoV-2 public health emergency.  Safety protocols were in place, including screening questions prior to the visit, additional usage of staff PPE, and extensive cleaning of exam room while observing appropriate contact time as indicated for disinfecting solutions.   HPI  [name] is a 60 y.o.-year-old female, initially referred by her PCP, Dr. Raoul Pitch, for management of Hashimoto's thyroiditis.  Last visit 6 months ago.  Interim history: She has idiopathic recurrent pancreatitis and gastritis.  Between 2020 and 2021, she had 7 pancreatitis hospitalizations. She had significant weight loss in the past.  She had extensive investigation for this without any cause found.  In 11/2019, [name] was considered a possible culprit and it was stopped.  She also had cholecystectomy in 04/2020.  Unfortunately, she had a another episode of pancreatitis in 06/2020, before last visit.  She was referred to [facility], where she had an ERCP in 07/2020.  No clear abnormalities were found, however, dual biliary and pancreatic sphincterotomies were performed. She had no more pancreatitis episodes since then. She now eats normally, solid foods. She had a clavicle fracture in 09/2020. This has healed. She had several UTIs.   He did not have any hives last visit.  Reviewed history: Patient has a history of Hashimoto's thyroiditis diagnosed in 08/2020 during investigation of her hives.  She did not require levothyroxine yet.  Pt. also has a history of recurrent urticaria: abdomen, back, groin, hands (started in 2018) >> almost entirelyresolved.  Reviewed her TFTs: Lab Results  Component Value Date   TSH 2.22 07/15/2020   TSH 4.58 (H) 01/14/2020   TSH 2.56 10/21/2019   TSH 1.98 02/02/2018   TSH 1.086 08/22/2017   FREET4 0.73 07/15/2020   FREET4 0.91 01/14/2020   FREET4 0.82 10/21/2019   FREET4  0.78 02/02/2018    Her antithyroid antibodies were elevated: Component     Latest Ref Rng & Units 01/14/2020  Thyroperoxidase Ab SerPl-aCnc     <9 IU/mL 661 (H)  Thyroglobulin Ab     < or = 1 IU/mL 39 (H)   Component     Latest Ref Rng & Units 09/24/2019  Thyroperoxidase Ab SerPl-aCnc     0 - 34 IU/mL 349 (H)  Thyroglobulin Antibody     0.0 - 0.9 IU/mL 52.8 (H)   She did not start Selenium as suggested after last OV as she forgot.  Pt denies: - feeling nodules in neck - hoarseness - dysphagia - choking - SOB with lying down  She has + FH of thyroid disorders in: M - postsurgical hypothyroidectomy.  No family history of thyroid cancer.  No history of radiation to head or neck.  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  No recent use of iodine supplements.  No herbal supplements.  She takes iron, vitamin B12 and vitamin D.  She also has a history of ischemic cardiomyopathy, s/p AMI, PAF, history of stroke. She also has a history of angioedema (to Lopressor).    ROS: Constitutional: + weight gain/no weight loss, no fatigue, no subjective hyperthermia, no subjective hypothermia Eyes: no blurry vision, no xerophthalmia ENT: no sore throat, + see HPI Cardiovascular: no CP/no SOB/no palpitations/no leg swelling Respiratory: no cough/no SOB/no wheezing Gastrointestinal: no N/no V/no D/no C/no acid reflux, no abdominal pain Musculoskeletal: no muscle aches/no joint aches Skin: resolved Rash (hives), no hair loss Neurological: no tremors/no numbness/no tingling/no dizziness  I reviewed pt's medications, allergies, PMH, social hx, family hx, and changes were documented in the history of present illness. Otherwise, unchanged from my initial visit note.  Past Medical History:  Diagnosis Date  . AAA (abdominal aortic aneurysm) ([place])    a. 3.3cm infrarenal by CT 02/2020.  . Angio-edema   . Arthritis   . CAD (coronary artery disease)    a. s/p NSTEMI in 10/2017 with cath showing LM disease  --> s/p CABG on 11/23/2017 with LIMA-LAD and SVG-OM.   [name] Carotid stenosis    a. Duplex 11/2018 9-14% RICA, 78-29% LICA.  [name] Gastritis and duodenitis 12/06/2019   11/2019 Peptic duodenitis  - No dysplasia or malignancy identified  B. STOMACH, BIOPSY:  - Mild chronic gastritis with intestinal metaplasia   . Hay fever   . History of kidney stones   . Ischemic cardiomyopathy    a. EF 35-40% by echo in 10/2017. b. 55% by echo 01/2020.  . NSTEMI (non-ST elevated myocardial infarction) ([place])   . PAF (paroxysmal atrial fibrillation) ([place])    Diagnosed by ILR  . Pancreatitis   . Pilonidal cyst 10/21/2019  . Pneumonia 11/16/2017  . Stroke [facility]) 08/23/2017   Left MCA infarct status post TPA and thrombectomy  . Urticaria    Past Surgical History:  Procedure Laterality Date  . BIOPSY  12/02/2019   Procedure: BIOPSY;  Surgeon: Rush Landmark Telford Nab., MD;  Location: [place];  Service: Gastroenterology;;  . CHOLECYSTECTOMY N/A 04/30/2020   Procedure: LAPAROSCOPIC CHOLECYSTECTOMY WITH INTRAOPERATIVE CHOLANGIOGRAM;  Surgeon: Armandina Gemma, MD;  Location: [facility];  Service: General;  Laterality: N/A;  . CORONARY ARTERY BYPASS GRAFT N/A 11/23/2017   Procedure: CORONARY ARTERY BYPASS GRAFTING (CABG) x 2, ON PUMP, USING LEFT INTERNAL MAMMARY ARTERY AND RIGHT GREATER SAPHENOUS VEIN HARVESTED ENDOSCOPICALLY;  Surgeon: Gaye Pollack, MD;  Location: [place];  Service: Open Heart Surgery;  Laterality: N/A;  . CRYOABLATION     cervical  . ESOPHAGOGASTRODUODENOSCOPY (EGD) WITH PROPOFOL N/A 12/02/2019   Procedure: ESOPHAGOGASTRODUODENOSCOPY (EGD) WITH PROPOFOL;  Surgeon: Rush Landmark Telford Nab., MD;  Location: [place];  Service: Gastroenterology;  Laterality: N/A;  . ESOPHAGOGASTRODUODENOSCOPY (EGD) WITH PROPOFOL N/A 03/19/2020   Procedure: ESOPHAGOGASTRODUODENOSCOPY (EGD) WITH PROPOFOL;  Surgeon: Milus Banister, MD;  [facility];  Service: Endoscopy;  Laterality: N/A;  . EUS N/A 03/19/2020   Procedure:  UPPER ENDOSCOPIC ULTRASOUND (EUS) RADIAL;  Surgeon: Milus Banister, MD;  [facility]: [facility];  Service: Endoscopy;  Laterality: N/A;  . EUS N/A 03/19/2020   Procedure: UPPER ENDOSCOPIC ULTRASOUND (EUS) LINEAR;  Surgeon: Milus Banister, MD;  [facility];  Service: Endoscopy;  Laterality: N/A;  . FINE NEEDLE ASPIRATION  12/02/2019   Procedure: FINE NEEDLE ASPIRATION (FNA) LINEAR;  Surgeon: Irving Copas., MD;  Location: [place];  Service: Gastroenterology;;  . FINE NEEDLE ASPIRATION N/A 03/19/2020   Procedure: FINE NEEDLE ASPIRATION (FNA) LINEAR;  Surgeon: Milus Banister, MD;  [facility];  Service: Endoscopy;  Laterality: N/A;  . IR ANGIO INTRA EXTRACRAN SEL COM CAROTID INNOMINATE UNI R MOD SED  08/21/2017  . IR ANGIO VERTEBRAL SEL SUBCLAVIAN INNOMINATE UNI R MOD SED  08/21/2017  . IR PERCUTANEOUS ART THROMBECTOMY/INFUSION INTRACRANIAL INC DIAG ANGIO  08/21/2017  . IR RADIOLOGIST EVAL & MGMT  10/30/2017  . LOOP RECORDER INSERTION N/A 08/28/2017   Procedure: LOOP RECORDER INSERTION;  Surgeon: Constance Haw, MD;  Location: [place] [facility];  Service: Cardiovascular;  Laterality: N/A;  . RADIOLOGY  WITH ANESTHESIA N/A 08/21/2017   Procedure: RADIOLOGY WITH ANESTHESIA;  Surgeon: Luanne Bras, MD;  Location: [place];  Service: Radiology;  Laterality: N/A;  . RIGHT/LEFT HEART CATH AND CORONARY ANGIOGRAPHY N/A 11/20/2017   Procedure: RIGHT/LEFT HEART CATH AND CORONARY ANGIOGRAPHY;  Surgeon: Jettie Booze, MD;  Location: [place] [facility];  Service: Cardiovascular;  Laterality: N/A;  . TEE WITHOUT CARDIOVERSION N/A 08/28/2017   Procedure: TRANSESOPHAGEAL ECHOCARDIOGRAM (TEE);  Surgeon: Fay Records, MD;  Location: [place];  Service: Cardiovascular;  Laterality: N/A;  . TEE WITHOUT CARDIOVERSION N/A 11/23/2017   Procedure: TRANSESOPHAGEAL ECHOCARDIOGRAM (TEE);  Surgeon: Gaye Pollack, MD;  Location: [place];  Service: Open Heart Surgery;   Laterality: N/A;  . TUBAL LIGATION    . UPPER ESOPHAGEAL ENDOSCOPIC ULTRASOUND (EUS) N/A 12/02/2019   Procedure: UPPER ESOPHAGEAL ENDOSCOPIC ULTRASOUND (EUS);  Surgeon: Irving Copas., MD;  Location: [place];  Service: Gastroenterology;  Laterality: N/A;   Social History   Socioeconomic History  . Marital status: Married    Spouse name: Not on file  . Number of children: 2  . Years of education: Not on file  . Highest education level: Not on file  Occupational History  . Not on file  Tobacco Use  . Smoking status: Current Some Day Smoker    Packs/day: 0.10    Years: 37.00    Pack years: 3.70    Types: Cigarettes  . Smokeless tobacco: Never Used  Vaping Use  . Vaping Use: Never used  Substance and Sexual Activity  . Alcohol use: No  . Drug use: No  . Sexual activity: Yes    Partners: Male  Other Topics Concern  . Not on file  Social History Narrative   Married. 2 children.    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    Walks with cane or wheelchair.    Former smoker.    Drinks caffeine.    Smoke alarm in the home.   Firearms in the home.    Wears her seatbelt.    Feels safe In her relationships.    Social Determinants of Health   Financial Resource Strain: Not on file  Food Insecurity: Not on file  Transportation Needs: Not on file  Physical Activity: Not on file  Stress: Not on file  Social Connections: Not on file  Intimate Partner Violence: Not on file   Current Outpatient Medications on File Prior to Visit  Medication Sig Dispense Refill  . acetaminophen (TYLENOL) 325 MG tablet Take 2 tablets (650 mg total) by mouth every 6 (six) hours as needed for mild pain or moderate pain (or Fever >/= 101). (Patient taking differently: Take 325 mg by mouth every 6 (six) hours as needed for mild pain or moderate pain (or Fever >/= 101).)    . baclofen (LIORESAL) 10 MG tablet TAKE 1 TABLET BY MOUTH THREE TIMES DAILY . 90 tablet 0  . calcium carbonate (TUMS - DOSED IN  MG ELEMENTAL CALCIUM) 500 MG chewable tablet Chew 1 tablet by mouth 2 (two) times daily as needed for indigestion or heartburn.     . carvedilol (COREG) 3.125 MG tablet Take 1 tablet (3.125 mg total) by mouth in the morning and at bedtime. 180 tablet 3  . cephALEXin (KEFLEX) 500 MG capsule Take 1 capsule (500 mg total) by mouth 3 (three) times daily. 21 capsule 0  . cholecalciferol (VITAMIN D) 1000 units tablet Take 1,000 Units by mouth daily.    . diclofenac Sodium (VOLTAREN) 1 %  GEL APPLY 2 GRAMS TOPICALLY 4 TIMES A DAY 300 g 2  . ferrous sulfate 325 (65 FE) MG tablet Take 325 mg by mouth 2 (two) times a week. Mondays & Thursdays.    . hydrOXYzine (ATARAX/VISTARIL) 10 MG tablet TAKE 1 TABLET BY MOUTH AT BEDTIME 90 tablet 1  . levocetirizine (XYZAL) 5 MG tablet TAKE 1 TABLET BY MOUTH ONCE DAILY IN THE EVENING 30 tablet 0  . ondansetron (ZOFRAN ODT) 4 MG disintegrating tablet Take 1 tablet (4 mg total) by mouth every 8 (eight) hours as needed for nausea or vomiting. 30 tablet 0  . oxycodone (OXY-IR) 5 MG capsule Take 1 capsule (5 mg total) by mouth every 12 (twelve) hours as needed for pain. 10 capsule 0  . pantoprazole (PROTONIX) 40 MG tablet Take 1 tablet by mouth once daily 90 tablet 1  . pravastatin (PRAVACHOL) 20 MG tablet TAKE 1 TABLET BY MOUTH ONCE DAILY IN THE EVENING 90 tablet 1  . rivaroxaban (XARELTO) 20 MG TABS tablet Take 1 tablet daily with evening meal (Patient taking differently: Take 20 mg by mouth daily with supper.) 30 tablet 11  . selenium 200 MCG TABS tablet Take by mouth.    . traMADol (ULTRAM) 50 MG tablet TAKE 1 TO 2 TABLETS BY MOUTH EVERY 8 HOURS AS NEEDED FOR MODERATE PAIN 180 tablet 5  . triamcinolone cream (KENALOG) 0.1 % Apply topically 4 (four) times daily. 30 g 0  . vitamin B-12 (CYANOCOBALAMIN) 100 MCG tablet Take 100 mcg by mouth daily.     No current facility-administered medications on file prior to visit.   Allergies  Allergen Reactions  . Hydromorphone Other  (See Comments)    Hallucinations Other reaction(s): Hallucination  . Lopressor [Metoprolol Tartrate] Swelling    Angioedema  . Sacubitril-Valsartan Swelling and Rash    Welts, possible angioedema  Rash  . Allegra Allergy [Fexofenadine Hcl] Hives  . Fexofenadine Hives  . Nsaids Other (See Comments)    On Xarelto Not taking d/t abdominal pain  . Gabapentin Rash  . Gabapentin (Once-Daily) Rash  . Lipitor [Atorvastatin] Diarrhea   Family History  Problem Relation Age of Onset  . Heart attack Father   . Hypertension Mother   . Hyperlipidemia Mother   . Diabetes type II Mother   . Urticaria Mother   . Hypertension Brother   . Breast cancer Cousin 88  . Immunodeficiency Neg Hx   . Eczema Neg Hx   . Atopy Neg Hx   . Asthma Neg Hx   . Angioedema Neg Hx   . Allergic rhinitis Neg Hx   . Colon cancer Neg Hx   . Esophageal cancer Neg Hx   . Stomach cancer Neg Hx   . Pancreatic cancer Neg Hx   . Colon polyps Neg Hx   . Esophageal varices Neg Hx   . Inflammatory bowel disease Neg Hx   . Rectal cancer Neg Hx     PE: BP 110/62 (BP Location: Left Arm, Patient Position: Sitting, Cuff Size: Normal)   Pulse 97   Ht 5\' 1"  (1.549 m)   Wt 112 lb 9.6 oz (51.1 kg)   LMP  (LMP Unknown) Comment: at age 28  SpO2 96%   BMI 21.28 kg/m  Wt Readings from Last 3 Encounters:  01/19/21 112 lb 9.6 oz (51.1 kg)  12/21/20 110 lb (49.9 kg)  10/20/20 106 lb (48.1 kg)   Constitutional: thin, in NAD Eyes: PERRLA, EOMI, no exophthalmos ENT: moist mucous membranes, no  thyromegaly but palpable, symmetric, thyroid, no cervical lymphadenopathy Cardiovascular: RRR, No MRG Respiratory: CTA B Gastrointestinal: abdomen soft, NT, ND, BS+ Musculoskeletal: no deformities, + weakness R arm and leg Skin: moist, warm, no rashes Neurological: no tremor with outstretched hands, DTR normal in all 4  ASSESSMENT: 1. Hashimoto thyroiditis  PLAN: 1. Hashimoto thyroiditis -She has a history of normal TFTs with  elevated TPO and ATA antibodies, giving her a diagnosis of Hashimoto's thyroiditis. -She was diagnosed with thyroid autoimmunity during investigation for hives -We did discuss in the past that the slightly elevated TSH was not necessarily an indication for treatment with thyroid hormones, however, at last visit, the 3 function tests were entirely normal, but the thyroid antibodies are higher -I did suggest selenium 200 mcg daily but at last visit she was not on this as she forgot.  I again advised her to start.  This visit, she tells me that she is now on it and would like to continue -She has a palpable thyroid, most likely due to thyroid inflammation -she denies neck compression symptoms -At today's visit, we will recheck her TFTs with thyroid antibodies  -We did discuss about how to take levothyroxine correctly in case she needs to start -We will have her return to the clinic in 1 year.  Component     Latest Ref Rng & Units 01/19/2021  TSH     0.35 - 4.50 uIU/mL 4.67 (H)  T4,Free(Direct)     0.60 - 1.60 ng/dL 0.84  Triiodothyronine,Free,Serum     2.3 - 4.2 pg/mL 3.2  Thyroperoxidase Ab SerPl-aCnc     <9 IU/mL 670 (H)  Thyroglobulin Ab     < or = 1 IU/mL 62 (H)  TSH is now slightly high, with normal free T4 and free T3. TPO antibodies are also higher than before, while thyroglobulin antibodies are slightly lower.  We will advise her to continue selenium for now. We do not absolutely need to start levothyroxine for now, due to the mild elevation of the TSH, but I would like to repeat her tests in 3 to 4 months.  Philemon Kingdom, MD PhD [facility]

## 2021-01-19 NOTE — Patient Instructions (Addendum)
Please stop at the lab.  Continue Selenium.  In case we need to start Levothyroxine, take the thyroid hormone every day, with water, at least 30 minutes before breakfast, separated by at least 4 hours from: - acid reflux medications - calcium - iron - multivitamins  Please come back for a follow-up appointment in 1 year.

## 2021-01-20 ENCOUNTER — Telehealth: Payer: Self-pay

## 2021-01-20 ENCOUNTER — Encounter: Payer: Self-pay | Admitting: Internal Medicine

## 2021-01-20 LAB — THYROID PEROXIDASE ANTIBODY: Thyroperoxidase Ab SerPl-aCnc: 670 IU/mL — ABNORMAL HIGH (ref <9)

## 2021-01-20 LAB — THYROGLOBULIN ANTIBODY: Thyroglobulin Ab: 62 IU/mL — ABNORMAL HIGH (ref <=1)

## 2021-01-20 NOTE — Telephone Encounter (Addendum)
Pt contacted and lab appt set. ----- Message from Philemon Kingdom, MD sent at 01/20/2021 12:13 PM EDT ----- Can you please call pt.:  TSH is very slightly high, with normal free T4 and free T3.  We do not absolutely need to start levothyroxine for now, but I would like to repeat her test in 3 to 4 months.  Labs are in. TPO antibodies are also higher than before, while thyroglobulin antibodies are slightly lower.  I would suggest to continue selenium for now.

## 2021-02-08 ENCOUNTER — Encounter: Payer: 59 | Attending: Physical Medicine & Rehabilitation | Admitting: Physical Medicine & Rehabilitation

## 2021-02-08 ENCOUNTER — Other Ambulatory Visit: Payer: Self-pay

## 2021-02-08 ENCOUNTER — Encounter: Payer: Self-pay | Admitting: Physical Medicine & Rehabilitation

## 2021-02-08 VITALS — BP 147/72 | HR 74 | Ht 61.0 in | Wt 114.0 lb

## 2021-02-08 DIAGNOSIS — G5602 Carpal tunnel syndrome, left upper limb: Secondary | ICD-10-CM | POA: Insufficient documentation

## 2021-02-08 NOTE — Progress Notes (Signed)
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 Symptoms are not relieved by conservative care.  Informed consent was obtained after describing risks and benefits of the procedure with the patient. These include bleeding bruising and infection as well as nerve injury. Patient elected to proceed and has given written consent. The distal wrist crease was marked and prepped with Betadine. The median nerve was visualized using ultrasound guidance.  With the needle in view, a 27-gauge 1 and 1/2 inch needle was inserted and 0.50 ccs of 30mg  of betamethasone was injected. Patient tolerated procedure well. Post procedure instructions given.

## 2021-02-11 ENCOUNTER — Other Ambulatory Visit: Payer: Self-pay | Admitting: Physical Medicine & Rehabilitation

## 2021-02-11 ENCOUNTER — Other Ambulatory Visit: Payer: Self-pay | Admitting: Family Medicine

## 2021-02-12 ENCOUNTER — Other Ambulatory Visit: Payer: Self-pay | Admitting: Physical Medicine & Rehabilitation

## 2021-02-12 ENCOUNTER — Telehealth: Payer: Self-pay | Admitting: Family Medicine

## 2021-02-17 NOTE — Telephone Encounter (Signed)
Rx sent to pharm 5/19. Please sched pt for UTI in an avail acute slots

## 2021-02-17 NOTE — Telephone Encounter (Signed)
1) Patient refill request.  Patient scheduled appt with Dr. Raoul Pitch for 6/2, she will be out of meds before her appt.   hydrOXYzine (ATARAX/VISTARIL) 10 MG tablet [[id number]]   [facility] [place]   2) Also patient is stating she is leaking urine about every time she gets up to walk, sneeze, cough, etc... Urine has odor and does not smell pleasant. Should she be concerned for urinary tract infection?  Please advise.

## 2021-02-25 ENCOUNTER — Encounter: Payer: Self-pay | Admitting: Family Medicine

## 2021-02-25 ENCOUNTER — Ambulatory Visit (INDEPENDENT_AMBULATORY_CARE_PROVIDER_SITE_OTHER): Payer: 59 | Admitting: Family Medicine

## 2021-02-25 ENCOUNTER — Other Ambulatory Visit: Payer: Self-pay

## 2021-02-25 VITALS — BP 116/71 | HR 89 | Temp 97.9°F | Ht 61.0 in | Wt 115.0 lb

## 2021-02-25 DIAGNOSIS — I714 Abdominal aortic aneurysm, without rupture, unspecified: Secondary | ICD-10-CM

## 2021-02-25 DIAGNOSIS — I482 Chronic atrial fibrillation, unspecified: Secondary | ICD-10-CM

## 2021-02-25 DIAGNOSIS — G8110 Spastic hemiplegia affecting unspecified side: Secondary | ICD-10-CM

## 2021-02-25 DIAGNOSIS — I214 Non-ST elevation (NSTEMI) myocardial infarction: Secondary | ICD-10-CM | POA: Diagnosis not present

## 2021-02-25 DIAGNOSIS — I509 Heart failure, unspecified: Secondary | ICD-10-CM

## 2021-02-25 DIAGNOSIS — I63512 Cerebral infarction due to unspecified occlusion or stenosis of left middle cerebral artery: Secondary | ICD-10-CM | POA: Diagnosis not present

## 2021-02-25 DIAGNOSIS — I429 Cardiomyopathy, unspecified: Secondary | ICD-10-CM

## 2021-02-25 DIAGNOSIS — I10 Essential (primary) hypertension: Secondary | ICD-10-CM

## 2021-02-25 DIAGNOSIS — I25810 Atherosclerosis of coronary artery bypass graft(s) without angina pectoris: Secondary | ICD-10-CM

## 2021-02-25 DIAGNOSIS — G8191 Hemiplegia, unspecified affecting right dominant side: Secondary | ICD-10-CM | POA: Diagnosis not present

## 2021-02-25 DIAGNOSIS — E785 Hyperlipidemia, unspecified: Secondary | ICD-10-CM

## 2021-02-25 DIAGNOSIS — K219 Gastro-esophageal reflux disease without esophagitis: Secondary | ICD-10-CM

## 2021-02-25 DIAGNOSIS — I5032 Chronic diastolic (congestive) heart failure: Secondary | ICD-10-CM

## 2021-02-25 DIAGNOSIS — F419 Anxiety disorder, unspecified: Secondary | ICD-10-CM

## 2021-02-25 DIAGNOSIS — I700 Atherosclerosis of aorta: Secondary | ICD-10-CM

## 2021-02-25 DIAGNOSIS — R350 Frequency of micturition: Secondary | ICD-10-CM

## 2021-02-25 MED ORDER — HYDROXYZINE HCL 10 MG PO TABS: 10.0000 mg | ORAL_TABLET | Freq: Every day | ORAL | 1 refills | Status: DC

## 2021-02-25 MED ORDER — TRAMADOL HCL 50 MG PO TABS: 50.0000 mg | ORAL_TABLET | Freq: Two times a day (BID) | ORAL | 5 refills | Status: DC | PRN

## 2021-02-25 MED ORDER — CARVEDILOL 3.125 MG PO TABS: 3.1250 mg | ORAL_TABLET | Freq: Two times a day (BID) | ORAL | 1 refills | Status: DC

## 2021-02-25 MED ORDER — PANTOPRAZOLE SODIUM 40 MG PO TBEC: 1.0000 | DELAYED_RELEASE_TABLET | Freq: Every day | ORAL | 1 refills | Status: DC

## 2021-02-25 MED ORDER — BACLOFEN 10 MG PO TABS: 10.0000 mg | ORAL_TABLET | Freq: Three times a day (TID) | ORAL | 5 refills | Status: DC

## 2021-02-25 MED ORDER — LEVOCETIRIZINE DIHYDROCHLORIDE 5 MG PO TABS: 5.0000 mg | ORAL_TABLET | Freq: Every evening | ORAL | 3 refills | Status: DC

## 2021-02-25 NOTE — Patient Instructions (Addendum)
Great to see you today.  I have refilled the medication(s) we provide.   We will call you with urine results.   Schedule your physical in September, we will collect fasting labs that day also.

## 2021-02-25 NOTE — Progress Notes (Signed)
This visit occurred during the SARS-CoV-2 public health emergency.  Safety protocols were in place, including screening questions prior to the visit, additional usage of staff PPE, and extensive cleaning of exam room while observing appropriate contact time as indicated for disinfecting solutions.    [name] , 08-19-1961, 60 y.o., female MRN: [id number] Patient Care Team    Relationship Specialty Notifications Start End  [name], DO PCP - General Family Medicine  10/13/17   Constance Haw, MD PCP - Electrophysiology Cardiology Admissions 08/28/17   Satira Sark, MD PCP - Cardiology Cardiology Admissions 12/15/17   Rosalin Hawking, MD Consulting Physician Neurology  10/13/17   Jamse Arn, MD Consulting Physician Physical Medicine and Rehabilitation  10/13/17   Luanne Bras, MD Consulting Physician Interventional Radiology  10/13/17   Mansouraty, Telford Nab., MD Consulting Physician Gastroenterology  03/03/20   Armandina Gemma, MD Consulting Physician General Surgery  04/09/20   Lucas Mallow, MD Consulting Physician Urology  04/09/20     Chief Complaint  Patient presents with  . Follow-up    cmc  . Hyperlipidemia    Pt is not fasting     Subjective: [name] is 60 y.o. female Pt presents for an OV follow-up on chronic medical conditions  H/o NSTEMI (non-ST elevated myocardial infarction) (HCC)/Coronary atherosclerosis of autologous artery bypass graft without angina/Benign essential HTN/Chronic diastolic CHF (congestive heart failure) (HCC)/Atrial fibrillation, chronic (HCC)/Atherosclerosis of aorta (HCC)/HLD Pt reports compliance with Coreg twice daily, statin and Xarelto. Blood pressures ranges at home from. Patient denies chest pain, shortness of breath or lower extremity edema.  Anxiety Patient reports Vistaril nightly is very helpful.   Left middle cerebral artery stroke (HCC)/Spastic hemiparesis affecting dominant side  (HCC)/Cerebrovascular accident (CVA) due to occlusion of left middle cerebral artery [facility]) Patient following with physical/rehab.  She reports use of baclofen 3 times daily is helpful to prevent her spasms from reoccurring.  She takes tramadol 50-100 mg usually before bed, sometimes twice daily if needed for her pain secondary to her stroke and arthritis.  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  She reports overall she is feeling much improved over the last few months since her GI surgery.  Urinary frequency She does endorse some urinary frequency that does not seem to be resolving.  She was seen in January for similar symptoms and had an E. coli UTI that was treated appropriately with Keflex.  She states her symptoms resolved but seem to quickly returned.  She denies any fever, chills, nausea, vomit or abdominal pain.    Depression screen [facility] 2/9 07/13/2020 05/05/2020  Decreased Interest 0 0  Down, Depressed, Hopeless 0 0  PHQ - 2 Score 0 0  Some recent data might be hidden    Allergies  Allergen Reactions  . Hydromorphone Other (See Comments)    Hallucinations Other reaction(s): Hallucination  . Lopressor [Metoprolol Tartrate] Swelling    Angioedema  . Sacubitril-Valsartan Swelling and Rash    Welts, possible angioedema  Rash  . Allegra Allergy [Fexofenadine Hcl] Hives  . Fexofenadine Hives  . Nsaids Other (See Comments)    On Xarelto Not taking d/t abdominal pain  . Gabapentin Rash  . Gabapentin (Once-Daily) Rash  . Lipitor [Atorvastatin] Diarrhea   Social History   Social History Narrative   Married. 2 children.    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    Walks with cane or wheelchair.  Former smoker.    Drinks caffeine.    Smoke alarm in the home.   Firearms in the home.    Wears her seatbelt.    Feels safe In her relationships.    Past Medical History:  Diagnosis Date  . AAA (abdominal aortic aneurysm) ([place])    a. 3.3cm infrarenal  by CT 02/2020.  [name] Abnormal findings on endoscopic ultrasound (EUS) 01/25/2020  . Abnormality present on pathology (Atypical Pathology) 01/25/2020  . Angio-edema   . Angioedema   . Arthritis   . CAD (coronary artery disease)    a. s/p NSTEMI in 10/2017 with cath showing LM disease --> s/p CABG on 11/23/2017 with LIMA-LAD and SVG-OM.   [name] Carotid stenosis    a. Duplex 11/2018 5-64% RICA, 33-29% LICA.  [name] Closed nondisplaced fracture of greater trochanter of right femur ([place]) 04/09/2020  . Cystocele, unspecified  03/03/2020  . Gallbladder sludge and cholelithiasis without cholecystitis 01/25/2020   Small stones of the gallbladder appreciated on CT 02/2020 as well.  . Gastritis and duodenitis 12/06/2019   11/2019 Peptic duodenitis  - No dysplasia or malignancy identified  B. STOMACH, BIOPSY:  - Mild chronic gastritis with intestinal metaplasia   . Hay fever   . History of kidney stones   . Idiopathic recurrent acute pancreatitis 01/25/2020  . Ischemic cardiomyopathy    a. EF 35-40% by echo in 10/2017. b. 55% by echo 01/2020.  [name] Kidney stone 02/18/2020  . Lytic bone lesion of femur   . NSTEMI (non-ST elevated myocardial infarction) ([place])   . PAF (paroxysmal atrial fibrillation) ([place])    Diagnosed by ILR  . Pancreatitis   . Pancreatitis, recurrent 11/07/2019  . Pilonidal cyst 10/21/2019  . Pneumonia 11/16/2017  . Stroke [facility]) 08/23/2017   Left MCA infarct status post TPA and thrombectomy  . Urticaria    Past Surgical History:  Procedure Laterality Date  . BIOPSY  12/02/2019   Procedure: BIOPSY;  Surgeon: Rush Landmark Telford Nab., MD;  Location: [place];  Service: Gastroenterology;;  . CHOLECYSTECTOMY N/A 04/30/2020   Procedure: LAPAROSCOPIC CHOLECYSTECTOMY WITH INTRAOPERATIVE CHOLANGIOGRAM;  Surgeon: Armandina Gemma, MD;  [facility]: [facility];  Service: General;  Laterality: N/A;  . CORONARY ARTERY BYPASS GRAFT N/A 11/23/2017   Procedure: CORONARY ARTERY BYPASS GRAFTING (CABG) x 2, ON PUMP, USING LEFT INTERNAL  MAMMARY ARTERY AND RIGHT GREATER SAPHENOUS VEIN HARVESTED ENDOSCOPICALLY;  Surgeon: Gaye Pollack, MD;  Location: [place];  Service: Open Heart Surgery;  Laterality: N/A;  . CRYOABLATION     cervical  . ESOPHAGOGASTRODUODENOSCOPY (EGD) WITH PROPOFOL N/A 12/02/2019   Procedure: ESOPHAGOGASTRODUODENOSCOPY (EGD) WITH PROPOFOL;  Surgeon: Rush Landmark Telford Nab., MD;  Location: [place];  Service: Gastroenterology;  Laterality: N/A;  . ESOPHAGOGASTRODUODENOSCOPY (EGD) WITH PROPOFOL N/A 03/19/2020   Procedure: ESOPHAGOGASTRODUODENOSCOPY (EGD) WITH PROPOFOL;  Surgeon: Milus Banister, MD;  [facility];  Service: Endoscopy;  Laterality: N/A;  . EUS N/A 03/19/2020   Procedure: UPPER ENDOSCOPIC ULTRASOUND (EUS) RADIAL;  Surgeon: Milus Banister, MD;  [facility];  Service: Endoscopy;  Laterality: N/A;  . EUS N/A 03/19/2020   Procedure: UPPER ENDOSCOPIC ULTRASOUND (EUS) LINEAR;  Surgeon: Milus Banister, MD;  [facility];  Service: Endoscopy;  Laterality: N/A;  . FINE NEEDLE ASPIRATION  12/02/2019   Procedure: FINE NEEDLE ASPIRATION (FNA) LINEAR;  Surgeon: Irving Copas., MD;  Location: [place];  Service: Gastroenterology;;  . FINE NEEDLE ASPIRATION N/A 03/19/2020   Procedure: FINE NEEDLE ASPIRATION (FNA) LINEAR;  Surgeon: Ardis Hughs,  Melene Plan, MD;  Location[facility];  Service: Endoscopy;  Laterality: N/A;  . IR ANGIO INTRA EXTRACRAN SEL COM CAROTID INNOMINATE UNI R MOD SED  08/21/2017  . IR ANGIO VERTEBRAL SEL SUBCLAVIAN INNOMINATE UNI R MOD SED  08/21/2017  . IR PERCUTANEOUS ART THROMBECTOMY/INFUSION INTRACRANIAL INC DIAG ANGIO  08/21/2017  . IR RADIOLOGIST EVAL & MGMT  10/30/2017  . LOOP RECORDER INSERTION N/A 08/28/2017   Procedure: LOOP RECORDER INSERTION;  Surgeon: Constance Haw, MD;  Location: [place] [facility];  Service: Cardiovascular;  Laterality: N/A;  . RADIOLOGY WITH ANESTHESIA N/A 08/21/2017   Procedure: RADIOLOGY WITH ANESTHESIA;  Surgeon:  Luanne Bras, MD;  Location: [place];  Service: Radiology;  Laterality: N/A;  . RIGHT/LEFT HEART CATH AND CORONARY ANGIOGRAPHY N/A 11/20/2017   Procedure: RIGHT/LEFT HEART CATH AND CORONARY ANGIOGRAPHY;  Surgeon: Jettie Booze, MD;  Location: [place] [facility];  Service: Cardiovascular;  Laterality: N/A;  . TEE WITHOUT CARDIOVERSION N/A 08/28/2017   Procedure: TRANSESOPHAGEAL ECHOCARDIOGRAM (TEE);  Surgeon: Fay Records, MD;  Location: [place];  Service: Cardiovascular;  Laterality: N/A;  . TEE WITHOUT CARDIOVERSION N/A 11/23/2017   Procedure: TRANSESOPHAGEAL ECHOCARDIOGRAM (TEE);  Surgeon: Gaye Pollack, MD;  Location: [place];  Service: Open Heart Surgery;  Laterality: N/A;  . TUBAL LIGATION    . UPPER ESOPHAGEAL ENDOSCOPIC ULTRASOUND (EUS) N/A 12/02/2019   Procedure: UPPER ESOPHAGEAL ENDOSCOPIC ULTRASOUND (EUS);  Surgeon: Irving Copas., MD;  Location: [place];  Service: Gastroenterology;  Laterality: N/A;   Family History  Problem Relation Age of Onset  . Heart attack Father   . Hypertension Mother   . Hyperlipidemia Mother   . Diabetes type II Mother   . Urticaria Mother   . Hypertension Brother   . Breast cancer Cousin 41  . Immunodeficiency Neg Hx   . Eczema Neg Hx   . Atopy Neg Hx   . Asthma Neg Hx   . Angioedema Neg Hx   . Allergic rhinitis Neg Hx   . Colon cancer Neg Hx   . Esophageal cancer Neg Hx   . Stomach cancer Neg Hx   . Pancreatic cancer Neg Hx   . Colon polyps Neg Hx   . Esophageal varices Neg Hx   . Inflammatory bowel disease Neg Hx   . Rectal cancer Neg Hx    Allergies as of 02/25/2021      Reactions   Hydromorphone Other (See Comments)   Hallucinations Other reaction(s): Hallucination   Lopressor [metoprolol Tartrate] Swelling   Angioedema   Sacubitril-valsartan Swelling, Rash   Welts, possible angioedema Rash   Allegra Allergy [fexofenadine Hcl] Hives   Fexofenadine Hives   Nsaids Other (See Comments)   On Xarelto Not  taking d/t abdominal pain   Gabapentin Rash   Gabapentin (once-daily) Rash   Lipitor [atorvastatin] Diarrhea      Medication List       Accurate as of February 25, 2021 11:59 PM. If you have any questions, ask your nurse or doctor.        STOP taking these medications   acetaminophen 325 MG tablet Commonly known as: TYLENOL Stopped by: Howard Pouch, DO   cephALEXin 500 MG capsule Commonly known as: KEFLEX Stopped by: Howard Pouch, DO   HYDROcodone-acetaminophen 5-325 MG tablet Commonly known as: NORCO/VICODIN Stopped by: Howard Pouch, DO   ondansetron 4 MG disintegrating tablet Commonly known as: Zofran ODT Stopped by: Howard Pouch, DO   oxycodone 5 MG capsule Commonly known  as: OXY-IR Stopped by: Howard Pouch, DO   triamcinolone cream 0.1 % Commonly known as: KENALOG Stopped by: Howard Pouch, DO     TAKE these medications   baclofen 10 MG tablet Commonly known as: LIORESAL Take 1 tablet (10 mg total) by mouth 3 (three) times daily.   calcium carbonate 500 MG chewable tablet Commonly known as: TUMS - dosed in mg elemental calcium Chew 1 tablet by mouth 2 (two) times daily as needed for indigestion or heartburn.   carvedilol 3.125 MG tablet Commonly known as: COREG Take 1 tablet (3.125 mg total) by mouth in the morning and at bedtime.   cholecalciferol 1000 units tablet Commonly known as: VITAMIN D Take 1,000 Units by mouth daily.   diclofenac Sodium 1 % Gel Commonly known as: VOLTAREN APPLY 2 GRAMS TOPICALLY 4 TIMES A DAY   ferrous sulfate 325 (65 FE) MG tablet Take 325 mg by mouth 2 (two) times a week. Mondays & Thursdays.   hydrOXYzine 10 MG tablet Commonly known as: ATARAX/VISTARIL Take 1 tablet (10 mg total) by mouth at bedtime.   levocetirizine 5 MG tablet Commonly known as: XYZAL Take 1 tablet (5 mg total) by mouth every evening.   pantoprazole 40 MG tablet Commonly known as: PROTONIX Take 1 tablet (40 mg total) by mouth daily.   pravastatin  20 MG tablet Commonly known as: PRAVACHOL TAKE 1 TABLET BY MOUTH ONCE DAILY IN THE EVENING   rivaroxaban 20 MG Tabs tablet Commonly known as: Xarelto Take 1 tablet daily with evening meal What changed:   how much to take  how to take this  when to take this  additional instructions   selenium 200 MCG Tabs tablet Take by mouth.   SENNA PO Take by mouth.   traMADol 50 MG tablet Commonly known as: ULTRAM Take 1-2 tablets (50-100 mg total) by mouth 2 (two) times daily as needed. TAKE 1 TO 2 TABLETS BY MOUTH EVERY 8 HOURS AS NEEDED FOR MODERATE PAIN What changed:   how much to take  how to take this  when to take this  reasons to take this Changed by: Howard Pouch, DO   vitamin B-12 100 MCG tablet Commonly known as: CYANOCOBALAMIN Take 100 mcg by mouth daily.       All past medical history, surgical history, allergies, family history, immunizations andmedications were updated in the EMR today and reviewed under the history and medication portions of their EMR.     ROS: Negative, with the exception of above mentioned in HPI   Objective:  BP 116/71   Pulse 89   Temp 97.9 F (36.6 C) (Oral)   Ht 5\' 1"  (1.549 m)   Wt 115 lb (52.2 kg)   LMP  (LMP Unknown) Comment: at age 51  SpO2 98%   BMI 21.73 kg/m  Body mass index is 21.73 kg/m. Gen: Afebrile. No acute distress. Nontoxic in appearance, well developed, well nourished.  Patient looks very well today. HENT: AT. [place]. Eyes:Pupils Equal Round Reactive to light, Extraocular movements intact,  Conjunctiva without redness, discharge or icterus. Neck/lymp/endocrine: Supple, no lymphadenopathy, no thyromegaly CV: RRR no murmur, no edema Chest: CTAB, no wheeze or crackles. Good air movement, normal resp effort.  Skin: No rashes, purpura or petechiae.  Neuro: Ambulating well without assistance.  PERLA. EOMi. Alert. Oriented x3  Psych: Normal affect, dress and demeanor. Normal speech. Normal thought content and  judgment.  No exam data present No results found. No results found for this or  any previous visit (from the past 24 hour(s)).  Assessment/Plan: [name] is a 60 y.o. 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 Continue routine follow-ups with cardiology. Continue pravastatin 20 mg daily-unable to tolerate higher doses or other statins> managed by cardiology. Continue Coreg 3.125 mg twice daily Continue Xarelto 20 mg daily  Anxiety/allergies Stable.   Continue hydroxyzine 10 mg daily at bedtime Continue Xyzal daily-evening  Abdominal aortic aneurysm (AAA) without rupture ([place]) Has been stable, repeat imaging due 2024  Left middle cerebral artery stroke (HCC)/Spastic hemiparesis affecting dominant side (HCC)/Cerebrovascular accident (CVA) due to occlusion of left middle cerebral artery (HCC) Stable. Continue statin and chronic anticoagulation with Xarelto. Continue baclofen 3 times daily Continue tramadol 50-100 mg twice daily as needed.  [place] controlled substance database reviewed and appropriate.  GERD: Stable. Continue Protonix  Urinary frequency - Urinalysis w microscopic + reflex cultur -We will wait for urine cultures to result before initiating treatment.  Would like to choose a different antibiotic and Keflex depending upon sensitivities.   Return in about 3 months (around 06/07/2021) for CPE (30 min), CMC (30 min).    Reviewed expectations re: course of current medical issues.  Discussed self-management of symptoms.  Outlined signs and symptoms indicating need for more acute intervention.  Patient verbalized understanding and all  questions were answered.  Patient received an After-Visit Summary.    Orders Placed This Encounter  Procedures  . Urine Culture  . Urinalysis w microscopic + reflex cultur  . REFLEXIVE URINE CULTURE   Meds ordered this encounter  Medications  . baclofen (LIORESAL) 10 MG tablet    Sig: Take 1 tablet (10 mg total) by mouth 3 (three) times daily.    Dispense:  90 tablet    Refill:  5  . carvedilol (COREG) 3.125 MG tablet    Sig: Take 1 tablet (3.125 mg total) by mouth in the morning and at bedtime.    Dispense:  180 tablet    Refill:  1  . hydrOXYzine (ATARAX/VISTARIL) 10 MG tablet    Sig: Take 1 tablet (10 mg total) by mouth at bedtime.    Dispense:  90 tablet    Refill:  1  . levocetirizine (XYZAL) 5 MG tablet    Sig: Take 1 tablet (5 mg total) by mouth every evening.    Dispense:  90 tablet    Refill:  3  . pantoprazole (PROTONIX) 40 MG tablet    Sig: Take 1 tablet (40 mg total) by mouth daily.    Dispense:  90 tablet    Refill:  1  . DISCONTD: traMADol (ULTRAM) 50 MG tablet    Sig: Take 1-2 tablets (50-100 mg total) by mouth 2 (two) times daily as needed. TAKE 1 TO 2 TABLETS BY MOUTH EVERY 8 HOURS AS NEEDED FOR MODERATE PAIN    Dispense:  120 tablet    Refill:  5   Referral Orders  No referral(s) requested today     Note is dictated utilizing voice recognition software. Although note has been proof read prior to signing, occasional typographical errors still can be missed. If any questions arise, please do not hesitate to call for verification.   electronically signed by:  Howard Pouch, DO  [place]

## 2021-02-26 ENCOUNTER — Encounter: Payer: Self-pay | Admitting: Family Medicine

## 2021-02-26 ENCOUNTER — Telehealth: Payer: Self-pay

## 2021-02-26 DIAGNOSIS — K219 Gastro-esophageal reflux disease without esophagitis: Secondary | ICD-10-CM

## 2021-02-26 HISTORY — DX: Gastro-esophageal reflux disease without esophagitis: K21.9

## 2021-02-26 MED ORDER — TRAMADOL HCL 50 MG PO TABS: 50.0000 mg | ORAL_TABLET | Freq: Two times a day (BID) | ORAL | 5 refills | Status: DC | PRN

## 2021-02-26 NOTE — Telephone Encounter (Signed)
Please inform pt script was called in for her with clarification.

## 2021-02-26 NOTE — Telephone Encounter (Signed)
Pt informed

## 2021-02-26 NOTE — Telephone Encounter (Signed)
Fax received in regards to tramadol clarification. Pharmacy has received two different directions or script.   Please resend medication to pharmacy

## 2021-02-26 NOTE — Addendum Note (Signed)
Addended by: Howard Pouch A on: 02/26/2021 02:05 PM   Modules accepted: Orders

## 2021-02-27 LAB — URINALYSIS W MICROSCOPIC + REFLEX CULTURE
Bilirubin Urine: NEGATIVE
Glucose, UA: NEGATIVE
Hgb urine dipstick: NEGATIVE
Hyaline Cast: NONE SEEN /LPF
Ketones, ur: NEGATIVE
Nitrites, Initial: NEGATIVE
Protein, ur: NEGATIVE
RBC / HPF: NONE SEEN /HPF (ref 0–2)
Specific Gravity, Urine: 1.008 (ref 1.001–1.035)
Squamous Epithelial / HPF: NONE SEEN /HPF (ref <=5)
pH: 6.5 (ref 5.0–8.0)

## 2021-02-27 LAB — URINE CULTURE
MICRO NUMBER:: 11965488
SPECIMEN QUALITY:: ADEQUATE

## 2021-02-27 LAB — CULTURE INDICATED

## 2021-03-01 ENCOUNTER — Telehealth: Payer: Self-pay | Admitting: Family Medicine

## 2021-03-01 MED ORDER — NITROFURANTOIN MONOHYD MACRO 100 MG PO CAPS: 100.0000 mg | ORAL_CAPSULE | Freq: Two times a day (BID) | ORAL | 0 refills | Status: DC

## 2021-03-01 NOTE — Telephone Encounter (Signed)
Please inform patient her urine culture is positive again for E. coli UTI.  I have called in a medication called Macrobid for her to take every 12 hours for 10 days. Follow-up in 2-3 weeks with provider for recheck to ensure UTI has completely resolved along with all symptoms.  We will also recollect her urine during this time.

## 2021-03-01 NOTE — Telephone Encounter (Signed)
Spoke with pt regarding labs and instructions.   

## 2021-03-04 ENCOUNTER — Telehealth: Payer: Self-pay | Admitting: Physical Medicine & Rehabilitation

## 2021-03-04 NOTE — Telephone Encounter (Signed)
Notified patient insurance has denied auth for knee injection next apt will be follow up

## 2021-03-18 ENCOUNTER — Encounter: Payer: Self-pay | Admitting: Physical Medicine & Rehabilitation

## 2021-03-18 ENCOUNTER — Other Ambulatory Visit: Payer: Self-pay

## 2021-03-18 ENCOUNTER — Encounter: Payer: 59 | Attending: Physical Medicine & Rehabilitation | Admitting: Physical Medicine & Rehabilitation

## 2021-03-18 VITALS — BP 124/75 | HR 104 | Temp 98.5°F | Ht 61.0 in | Wt 115.4 lb

## 2021-03-18 DIAGNOSIS — M170 Bilateral primary osteoarthritis of knee: Secondary | ICD-10-CM | POA: Diagnosis present

## 2021-03-18 DIAGNOSIS — G8110 Spastic hemiplegia affecting unspecified side: Secondary | ICD-10-CM | POA: Diagnosis present

## 2021-03-18 NOTE — Patient Instructions (Signed)
Hylan G-F 20 intra-articular injection What is this medication? HYLAN G-F 20 (HI lan G F 20) is used to treat osteoarthritis of the knee. Itlubricates and cushions the joint, reducing pain in the knee. This medicine may be used for other purposes; ask your health care provider orpharmacist if you have questions. COMMON BRAND NAME(S): Synvisc, Synvisc-One What should I tell my care team before I take this medication? They need to know if you have any of these conditions: severe knee inflammation skin conditions or sensitivity skin or joint infection venous stasis an unusual or allergic reaction to hylan G-F 20, hyaluronan (sodium hyaluronate), eggs, other medicines, foods, dyes, or preservatives pregnant or trying to get pregnant breast-feeding How should I use this medication? This medicine is for injection into the knee joint. It is given by a healthcare professional in a hospital or clinic setting. Talk to your pediatrician regarding the use of this medicine in children. Thismedicine is not approved for use in children. Overdosage: If you think you have taken too much of this medicine contact apoison control center or emergency room at once. NOTE: This medicine is only for you. Do not share this medicine with others. What if I miss a dose? Keep appointments for follow-up doses as directed. For Synvisc, you will need weekly injections for 3 doses. It is important not to miss your dose. If you will receive Synvisc-One, then only 1 injection will be needed. Call yourdoctor or health care professional if you are unable to keep an appointment. What may interact with this medication? Do not take this medicine with any of the following medications: other injections for the joint like steroids or anesthetics certain skin disinfectants like benzalkonium chloride This list may not describe all possible interactions. Give your health care provider a list of all the medicines, herbs, non-prescription  drugs, or dietary supplements you use. Also tell them if you smoke, drink alcohol, or use illegaldrugs. Some items may interact with your medicine. What should I watch for while using this medication? Tell your doctor or healthcare professional if your symptoms do not start to get better or if they get worse. Your condition will be monitored carefully while you are receiving this medicine. Most persons get pain relief for up to 75months after treatment. Avoid strenuous activities (high-impact sports, jogging) or majorweight-bearing activities for 48 hours after the injection. What side effects may I notice from receiving this medication? Side effects that you should report to your doctor or health care professionalas soon as possible: allergic reactions like skin rash, itching or hives, swelling of the face, lips, or tongue difficulty breathing fever or chills severe joint pain or swelling unusual bleeding or bruising Side effects that usually do not require medical attention (report to yourdoctor or health care professional if they continue or are bothersome): dizziness flushing general ill feeling or flu-like symptoms headache minor joint pain or swelling muscle pain or cramps pain, redness, irritation or bruising at site of injection This list may not describe all possible side effects. Call your doctor for medical advice about side effects. You may report side effects to FDA at1-800-FDA-1088. Where should I keep my medication? This drug is given in a hospital or clinic and will not be stored at home. NOTE: This sheet is a summary. It may not cover all possible information. If you have questions about this medicine, talk to your doctor, pharmacist, orhealth care provider.  2022 Elsevier/Gold Standard (2015-10-15 11:48:41)

## 2021-03-18 NOTE — Progress Notes (Signed)
Subjective:    Patient ID: [name], female    DOB: [birth year], 60 y.o.   MRN: [id number] Bilateral knee OA             B/l Zilretta injections performed on 03/23/18 - with good benefit             Steroid injection on 5/28 with benefit, minimal benefit in 10/2019             Synvisc b/l on 10/05/2018 with benefit, repeat injection on 06/29/20 with benefit in right knee, limited benefit in left knee             Encouraged trial Lidoderm patch, reminded againx6             Xrays reviewed, medial > lateral OA b/l             Continue Voltaren gel             Will likely require referral to Ortho HPI CC: R>L knee pain Patient previously seeing Dr. Posey Pronto but has had to switch MDs since Dr. Posey Pronto moved out of town. 60 yo female with hx of Right hemiparesis due to CVA 08/21/2017, had MI at same time, had cardiac stents, but ended up with CABG, had loop recorder  RIght knee pain mainly medial, symptoms mainly with ambulation but also has symptoms first thing in am .  Reviewed x-rays of both knees performed 09/28/2018 demonstrating primarily medial compartment osteoarthritis with moderate joint space narrowing. The patient has had CVA and is not a good candidate for oral nonsteroidal anti-inflammatories.  Voltaren gel is of limited benefit.  The patient would like to avoid surgery given her cardiac history as well as CVA history. Has had good results with right knee Synvisc injection as noted above last performed approximately 7 months ago.  The patient also had good results with bilateral knee Zilretta injections performed in 2019 but no significant benefit with standard [place] performed in 2021.  Pain Inventory Average Pain 6 Pain Right Now 6 My pain is burning, tingling, and aching  In the last 24 hours, has pain interfered with the following? General activity 5 Relation with others 5 Enjoyment of life 5 What TIME of day is your pain at its worst? morning  Sleep (in general)  Fair  Pain is worse with: inactivity Pain improves with: injections Relief from Meds:  na  Family History  Problem Relation Age of Onset   Heart attack Father    Hypertension Mother    Hyperlipidemia Mother    Diabetes type II Mother    Urticaria Mother    Hypertension Brother    Breast cancer Cousin 45   Immunodeficiency Neg Hx    Eczema Neg Hx    Atopy Neg Hx    Asthma Neg Hx    Angioedema Neg Hx    Allergic rhinitis Neg Hx    Colon cancer Neg Hx    Esophageal cancer Neg Hx    Stomach cancer Neg Hx    Pancreatic cancer Neg Hx    Colon polyps Neg Hx    Esophageal varices Neg Hx    Inflammatory bowel disease Neg Hx    Rectal cancer Neg Hx    Social History   Socioeconomic History   Marital status: Married    Spouse name: Not on file   Number of children: 2   Years of education: Not on file   Highest education level: Not on file  Occupational  History   Not on file  Tobacco Use   Smoking status: Some Days    Packs/day: 0.10    Years: 37.00    Pack years: 3.70    Types: Cigarettes   Smokeless tobacco: Never  Vaping Use   Vaping Use: Never used  Substance and Sexual Activity   Alcohol use: No   Drug use: No   Sexual activity: Yes    Partners: Male  Other Topics Concern   Not on file  Social History Narrative   Married. 2 children.    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    Walks with cane or wheelchair.    Former smoker.    Drinks caffeine.    Smoke alarm in the home.   Firearms in the home.    Wears her seatbelt.    Feels safe In her relationships.    Social Determinants of Health   Financial Resource Strain: Not on file  Food Insecurity: Not on file  Transportation Needs: Not on file  Physical Activity: Not on file  Stress: Not on file  Social Connections: Not on file   Past Surgical History:  Procedure Laterality Date   BIOPSY  12/02/2019   Procedure: BIOPSY;  Surgeon: Rush Landmark, Telford Nab., MD;  Location: [facility];  Service:  Gastroenterology;;   CHOLECYSTECTOMY N/A 04/30/2020   Procedure: LAPAROSCOPIC CHOLECYSTECTOMY WITH INTRAOPERATIVE CHOLANGIOGRAM;  Surgeon: Armandina Gemma, MD;  Location: [facility];  Service: General;  Laterality: N/A;   CORONARY ARTERY BYPASS GRAFT N/A 11/23/2017   Procedure: CORONARY ARTERY BYPASS GRAFTING (CABG) x 2, ON PUMP, USING LEFT INTERNAL MAMMARY ARTERY AND RIGHT GREATER SAPHENOUS VEIN HARVESTED ENDOSCOPICALLY;  Surgeon: Gaye Pollack, MD;  Location: [place];  Service: Open Heart Surgery;  Laterality: N/A;   CRYOABLATION     cervical   ESOPHAGOGASTRODUODENOSCOPY (EGD) WITH PROPOFOL N/A 12/02/2019   Procedure: ESOPHAGOGASTRODUODENOSCOPY (EGD) WITH PROPOFOL;  Surgeon: Rush Landmark Telford Nab., MD;  Location: [facility];  Service: Gastroenterology;  Laterality: N/A;   ESOPHAGOGASTRODUODENOSCOPY (EGD) WITH PROPOFOL N/A 03/19/2020   Procedure: ESOPHAGOGASTRODUODENOSCOPY (EGD) WITH PROPOFOL;  Surgeon: Milus Banister, MD;  [facility];  Service: Endoscopy;  Laterality: N/A;   EUS N/A 03/19/2020   Procedure: UPPER ENDOSCOPIC ULTRASOUND (EUS) RADIAL;  Surgeon: Milus Banister, MD;  [facility];  Service: Endoscopy;  Laterality: N/A;   EUS N/A 03/19/2020   Procedure: UPPER ENDOSCOPIC ULTRASOUND (EUS) LINEAR;  Surgeon: Milus Banister, MD;  [facility];  Service: Endoscopy;  Laterality: N/A;   FINE NEEDLE ASPIRATION  12/02/2019   Procedure: FINE NEEDLE ASPIRATION (FNA) LINEAR;  Surgeon: Irving Copas., MD;  Location: [place];  Service: Gastroenterology;;   FINE NEEDLE ASPIRATION N/A 03/19/2020   Procedure: FINE NEEDLE ASPIRATION (FNA) LINEAR;  Surgeon: Milus Banister, MD;  [facility];  Service: Endoscopy;  Laterality: N/A;   IR ANGIO INTRA EXTRACRAN SEL COM CAROTID INNOMINATE UNI R MOD SED  08/21/2017   IR ANGIO VERTEBRAL SEL SUBCLAVIAN INNOMINATE UNI R MOD SED  08/21/2017   IR PERCUTANEOUS ART THROMBECTOMY/INFUSION INTRACRANIAL INC DIAG ANGIO   08/21/2017   IR RADIOLOGIST EVAL & MGMT  10/30/2017   LOOP RECORDER INSERTION N/A 08/28/2017   Procedure: LOOP RECORDER INSERTION;  Surgeon: Constance Haw, MD;  Location: [facility];  Service: Cardiovascular;  Laterality: N/A;   RADIOLOGY WITH ANESTHESIA N/A 08/21/2017   Procedure: RADIOLOGY WITH ANESTHESIA;  Surgeon: Luanne Bras, MD;  Location: [place];  Service: Radiology;  Laterality: N/A;  RIGHT/LEFT HEART CATH AND CORONARY ANGIOGRAPHY N/A 11/20/2017   Procedure: RIGHT/LEFT HEART CATH AND CORONARY ANGIOGRAPHY;  Surgeon: Jettie Booze, MD;  Location: [place] [facility];  Service: Cardiovascular;  Laterality: N/A;   TEE WITHOUT CARDIOVERSION N/A 08/28/2017   Procedure: TRANSESOPHAGEAL ECHOCARDIOGRAM (TEE);  Surgeon: Fay Records, MD;  Location: [place];  Service: Cardiovascular;  Laterality: N/A;   TEE WITHOUT CARDIOVERSION N/A 11/23/2017   Procedure: TRANSESOPHAGEAL ECHOCARDIOGRAM (TEE);  Surgeon: Gaye Pollack, MD;  Location: [place];  Service: Open Heart Surgery;  Laterality: N/A;   TUBAL LIGATION     UPPER ESOPHAGEAL ENDOSCOPIC ULTRASOUND (EUS) N/A 12/02/2019   Procedure: UPPER ESOPHAGEAL ENDOSCOPIC ULTRASOUND (EUS);  Surgeon: Irving Copas., MD;  Location: [place];  Service: Gastroenterology;  Laterality: N/A;   Past Surgical History:  Procedure Laterality Date   BIOPSY  12/02/2019   Procedure: BIOPSY;  Surgeon: Mansouraty, Telford Nab., MD;  Location: [facility];  Service: Gastroenterology;;   CHOLECYSTECTOMY N/A 04/30/2020   Procedure: LAPAROSCOPIC CHOLECYSTECTOMY WITH INTRAOPERATIVE CHOLANGIOGRAM;  Surgeon: Armandina Gemma, MD;  [facility];  Service: General;  Laterality: N/A;   CORONARY ARTERY BYPASS GRAFT N/A 11/23/2017   Procedure: CORONARY ARTERY BYPASS GRAFTING (CABG) x 2, ON PUMP, USING LEFT INTERNAL MAMMARY ARTERY AND RIGHT GREATER SAPHENOUS VEIN HARVESTED ENDOSCOPICALLY;  Surgeon: Gaye Pollack, MD;  Location: [place];  Service: Open  Heart Surgery;  Laterality: N/A;   CRYOABLATION     cervical   ESOPHAGOGASTRODUODENOSCOPY (EGD) WITH PROPOFOL N/A 12/02/2019   Procedure: ESOPHAGOGASTRODUODENOSCOPY (EGD) WITH PROPOFOL;  Surgeon: Rush Landmark Telford Nab., MD;  Location: [place];  Service: Gastroenterology;  Laterality: N/A;   ESOPHAGOGASTRODUODENOSCOPY (EGD) WITH PROPOFOL N/A 03/19/2020   Procedure: ESOPHAGOGASTRODUODENOSCOPY (EGD) WITH PROPOFOL;  Surgeon: Milus Banister, MD;  [facility];  Service: Endoscopy;  Laterality: N/A;   EUS N/A 03/19/2020   Procedure: UPPER ENDOSCOPIC ULTRASOUND (EUS) RADIAL;  Surgeon: Milus Banister, MD;  [facility];  Service: Endoscopy;  Laterality: N/A;   EUS N/A 03/19/2020   Procedure: UPPER ENDOSCOPIC ULTRASOUND (EUS) LINEAR;  Surgeon: Milus Banister, MD;  [facility];  Service: Endoscopy;  Laterality: N/A;   FINE NEEDLE ASPIRATION  12/02/2019   Procedure: FINE NEEDLE ASPIRATION (FNA) LINEAR;  Surgeon: Irving Copas., MD;  Location: [place];  Service: Gastroenterology;;   FINE NEEDLE ASPIRATION N/A 03/19/2020   Procedure: FINE NEEDLE ASPIRATION (FNA) LINEAR;  Surgeon: Milus Banister, MD;  [facility];  Service: Endoscopy;  Laterality: N/A;   IR ANGIO INTRA EXTRACRAN SEL COM CAROTID INNOMINATE UNI R MOD SED  08/21/2017   IR ANGIO VERTEBRAL SEL SUBCLAVIAN INNOMINATE UNI R MOD SED  08/21/2017   IR PERCUTANEOUS ART THROMBECTOMY/INFUSION INTRACRANIAL INC DIAG ANGIO  08/21/2017   IR RADIOLOGIST EVAL & MGMT  10/30/2017   LOOP RECORDER INSERTION N/A 08/28/2017   Procedure: LOOP RECORDER INSERTION;  Surgeon: Constance Haw, MD;  Location: [place] [facility];  Service: Cardiovascular;  Laterality: N/A;   RADIOLOGY WITH ANESTHESIA N/A 08/21/2017   Procedure: RADIOLOGY WITH ANESTHESIA;  Surgeon: Luanne Bras, MD;  Location: [place];  Service: Radiology;  Laterality: N/A;   RIGHT/LEFT HEART CATH AND CORONARY ANGIOGRAPHY N/A 11/20/2017    Procedure: RIGHT/LEFT HEART CATH AND CORONARY ANGIOGRAPHY;  Surgeon: Jettie Booze, MD;  Location: [place] [facility];  Service: Cardiovascular;  Laterality: N/A;   TEE WITHOUT CARDIOVERSION N/A 08/28/2017   Procedure: TRANSESOPHAGEAL ECHOCARDIOGRAM (TEE);  Surgeon: Fay Records, MD;  [facility];  Service: Cardiovascular;  Laterality: N/A;   TEE WITHOUT CARDIOVERSION N/A 11/23/2017   Procedure: TRANSESOPHAGEAL ECHOCARDIOGRAM (TEE);  Surgeon: Gaye Pollack, MD;  Location: [place];  Service: Open Heart Surgery;  Laterality: N/A;   TUBAL LIGATION     UPPER ESOPHAGEAL ENDOSCOPIC ULTRASOUND (EUS) N/A 12/02/2019   Procedure: UPPER ESOPHAGEAL ENDOSCOPIC ULTRASOUND (EUS);  Surgeon: Irving Copas., MD;  Location: [place];  Service: Gastroenterology;  Laterality: N/A;   Past Medical History:  Diagnosis Date   AAA (abdominal aortic aneurysm) ([place])    a. 3.3cm infrarenal by CT 02/2020.   Abnormal findings on endoscopic ultrasound (EUS) 01/25/2020   Abnormality present on pathology (Atypical Pathology) 01/25/2020   Angio-edema    Angioedema    Arthritis    CAD (coronary artery disease)    a. s/p NSTEMI in 10/2017 with cath showing LM disease --> s/p CABG on 11/23/2017 with LIMA-LAD and SVG-OM.    Carotid stenosis    a. Duplex 11/2018 3-76% RICA, 28-31% LICA.   Closed nondisplaced fracture of greater trochanter of right femur ([place]) 04/09/2020   Cystocele, unspecified  03/03/2020   Gallbladder sludge and cholelithiasis without cholecystitis 01/25/2020   Small stones of the gallbladder appreciated on CT 02/2020 as well.   Gastritis and duodenitis 12/06/2019   11/2019 Peptic duodenitis  -  No dysplasia or malignancy identified  B. STOMACH, BIOPSY:  -  Mild chronic gastritis with intestinal metaplasia    Hay fever    History of kidney stones    Idiopathic recurrent acute pancreatitis 01/25/2020   Ischemic cardiomyopathy    a. EF 35-40% by echo in 10/2017. b. 55% by echo 01/2020.   Kidney  stone 02/18/2020   Lytic bone lesion of femur    NSTEMI (non-ST elevated myocardial infarction) (HCC)    PAF (paroxysmal atrial fibrillation) ([place])    Diagnosed by ILR   Pancreatitis    Pancreatitis, recurrent 11/07/2019   Pilonidal cyst 10/21/2019   Pneumonia 11/16/2017   Stroke ([place]) 08/23/2017   Left MCA infarct status post TPA and thrombectomy   Urticaria    BP 124/75 (BP Location: Left Arm, Patient Position: Sitting, Cuff Size: Normal)   Pulse (!) 104   Temp 98.5 F (36.9 C) (Oral)   Ht 5\' 1"  (1.549 m)   Wt 115 lb 6.4 oz (52.3 kg)   LMP  (LMP Unknown) Comment: at age 11  SpO2 95%   BMI 21.80 kg/m   Opioid Risk Score:   Fall Risk Score:  `1  Depression screen PHQ 2/9  Depression screen [facility] 2/9 07/13/2020 05/05/2020  Decreased Interest 0 0  Down, Depressed, Hopeless 0 0  PHQ - 2 Score 0 0  Some recent data might be hidden     Review of Systems  Musculoskeletal:        Bilateral knee pain Hand pain   Neurological:  Positive for weakness and numbness.       Tingling  All other systems reviewed and are negative.     Objective:   Physical Exam Constitutional:      Appearance: She is normal weight.  HENT:     Head: Normocephalic and atraumatic.  Eyes:     Extraocular Movements: Extraocular movements intact.     Conjunctiva/sclera: Conjunctivae normal.     Pupils: Pupils are equal, round, and reactive to light.  Musculoskeletal:     Comments: Right knee no evidence of effusion there is mild varus deformity, tenderness along the medial joint line  but not the lateral joint line tenderness along the superior pole of the patella but not the inferior Left knee no evidence of effusion there is mild varus deformity, there is tenderness along the medial joint line but not the lateral joint line there is tenderness along the superior pole of the patella but not the inferior pole.  Neurological:     Mental Status: She is alert and oriented to person, place, and time.      Cranial Nerves: No dysarthria or facial asymmetry.     Motor: Weakness present. No abnormal muscle tone.     Coordination: Coordination abnormal. Finger-Nose-Finger Test abnormal.     Comments: Motor strength is 4/5 in the right deltoid bicep tricep grip there is some limitation of shoulder range of motion on the right side. Right lower extremity 4/5 hip flexor knee extensor ankle dorsiflexor. Motor strength is 5/5 on the left side  Difficulty with finger thumb opposition on the right side as well as finger-nose-finger  Psychiatric:        Mood and Affect: Mood normal.        Behavior: Behavior normal.          Assessment & Plan:   #1.  Bilateral knee pain due to osteoarthritis medial compartment.  Pain does limit her activity level and has not responded to conservative care such as OTC medications Voltaren gel.  Has had good result with long-acting corticosteroid formulation, Zilretta as well as Synvisc gel.  She is not a good candidate for nonsteroidal anti-inflammatories due to cardiac disease as well as history of CVA.  Recommend repeat right knee Synvisc injection, would repeat left knee the following month if the right knee is doing better Discussed with patient agrees with plan she will continue with her Voltaren gel.

## 2021-03-23 ENCOUNTER — Other Ambulatory Visit: Payer: Self-pay

## 2021-03-23 ENCOUNTER — Ambulatory Visit (INDEPENDENT_AMBULATORY_CARE_PROVIDER_SITE_OTHER): Payer: 59 | Admitting: Family Medicine

## 2021-03-23 ENCOUNTER — Encounter: Payer: Self-pay | Admitting: Family Medicine

## 2021-03-23 VITALS — BP 100/69 | HR 71 | Temp 97.9°F | Ht 61.0 in | Wt 119.0 lb

## 2021-03-23 DIAGNOSIS — L309 Dermatitis, unspecified: Secondary | ICD-10-CM | POA: Diagnosis not present

## 2021-03-23 DIAGNOSIS — N390 Urinary tract infection, site not specified: Secondary | ICD-10-CM | POA: Diagnosis not present

## 2021-03-23 LAB — POCT URINALYSIS DIPSTICK
Bilirubin, UA: NEGATIVE
Blood, UA: NEGATIVE
Glucose, UA: NEGATIVE
Ketones, UA: NEGATIVE
Leukocytes, UA: NEGATIVE
Nitrite, UA: NEGATIVE
Protein, UA: NEGATIVE
Spec Grav, UA: 1.015 (ref 1.010–1.025)
Urobilinogen, UA: 0.2 E.U./dL
pH, UA: 6 (ref 5.0–8.0)

## 2021-03-23 MED ORDER — TRIAMCINOLONE ACETONIDE 0.1 % EX CREA: TOPICAL_CREAM | Freq: Four times a day (QID) | CUTANEOUS | 5 refills | Status: DC

## 2021-03-23 NOTE — Progress Notes (Signed)
This visit occurred during the SARS-CoV-2 public health emergency.  Safety protocols were in place, including screening questions prior to the visit, additional usage of staff PPE, and extensive cleaning of exam room while observing appropriate contact time as indicated for disinfecting solutions.    [name] , Mar 07, 1961, 60 y.o., female MRN: [id number] Patient Care Team    Relationship Specialty Notifications Start End  [name], DO PCP - General Family Medicine  10/13/17   Constance Haw, MD PCP - Electrophysiology Cardiology Admissions 08/28/17   Satira Sark, MD PCP - Cardiology Cardiology Admissions 12/15/17   Rosalin Hawking, MD Consulting Physician Neurology  10/13/17   Jamse Arn, MD Consulting Physician Physical Medicine and Rehabilitation  10/13/17   Luanne Bras, MD Consulting Physician Interventional Radiology  10/13/17   Mansouraty, Telford Nab., MD Consulting Physician Gastroenterology  03/03/20   Armandina Gemma, MD Consulting Physician General Surgery  04/09/20   Lucas Mallow, MD Consulting Physician Urology  04/09/20     Chief Complaint  Patient presents with   Recurrent UTI    No current sx     Subjective: Pt presents for an OV for follow up on recurrent UTI.  She has had a recurrent E.Coli UTI. We last treated with Macrobid extended course of 10 days and pt reports she feels this time all symptoms resolved. She has no dysuria, ur.fq, fever, or urinary smell.   She also would like to talk about her rash today. She has used a steroid cream in the past as needed (kenalog). Rash occurs on hand and between fingers at times. She reports it is itchy and burns when it occurs.    Depression screen [facility] 2/9 07/13/2020 05/05/2020  Decreased Interest 0 0  Down, Depressed, Hopeless 0 0  PHQ - 2 Score 0 0  Some recent data might be hidden    Allergies  Allergen Reactions   Hydromorphone Other (See Comments)    Hallucinations Other  reaction(s): Hallucination   Lopressor [Metoprolol Tartrate] Swelling    Angioedema   Sacubitril-Valsartan Swelling and Rash    Welts, possible angioedema  Rash   Allegra Allergy [Fexofenadine Hcl] Hives   Fexofenadine Hives   Nsaids Other (See Comments)    On Xarelto Not taking d/t abdominal pain   Gabapentin Rash   Gabapentin (Once-Daily) Rash   Lipitor [Atorvastatin] Diarrhea   Social History   Social History Narrative   Married. 2 children.    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    Walks with cane or wheelchair.    Former smoker.    Drinks caffeine.    Smoke alarm in the home.   Firearms in the home.    Wears her seatbelt.    Feels safe In her relationships.    Past Medical History:  Diagnosis Date   AAA (abdominal aortic aneurysm) ([place])    a. 3.3cm infrarenal by CT 02/2020.   Abnormal findings on endoscopic ultrasound (EUS) 01/25/2020   Abnormality present on pathology (Atypical Pathology) 01/25/2020   Angio-edema    Angioedema    Arthritis    CAD (coronary artery disease)    a. s/p NSTEMI in 10/2017 with cath showing LM disease --> s/p CABG on 11/23/2017 with LIMA-LAD and SVG-OM.    Carotid stenosis    a. Duplex 11/2018 8-18% RICA, 29-93% LICA.   Closed nondisplaced fracture of greater trochanter of right femur ([place]) 04/09/2020   Cystocele, unspecified  03/03/2020   Gallbladder sludge and  cholelithiasis without cholecystitis 01/25/2020   Small stones of the gallbladder appreciated on CT 02/2020 as well.   Gastritis and duodenitis 12/06/2019   11/2019 Peptic duodenitis  -  No dysplasia or malignancy identified  B. STOMACH, BIOPSY:  -  Mild chronic gastritis with intestinal metaplasia    Hay fever    History of kidney stones    Idiopathic recurrent acute pancreatitis 01/25/2020   Ischemic cardiomyopathy    a. EF 35-40% by echo in 10/2017. b. 55% by echo 01/2020.   Kidney stone 02/18/2020   Lytic bone lesion of femur    NSTEMI (non-ST elevated myocardial infarction) (HCC)    PAF  (paroxysmal atrial fibrillation) ([place])    Diagnosed by ILR   Pancreatitis    Pancreatitis, recurrent 11/07/2019   Pilonidal cyst 10/21/2019   Pneumonia 11/16/2017   Stroke ([place]) 08/23/2017   Left MCA infarct status post TPA and thrombectomy   Urticaria    Past Surgical History:  Procedure Laterality Date   BIOPSY  12/02/2019   Procedure: BIOPSY;  Surgeon: Irving Copas., MD;  Location: [place];  Service: Gastroenterology;;   CHOLECYSTECTOMY N/A 04/30/2020   Procedure: LAPAROSCOPIC CHOLECYSTECTOMY WITH INTRAOPERATIVE CHOLANGIOGRAM;  Surgeon: Armandina Gemma, MD;  Location: [facility];  Service: General;  Laterality: N/A;   CORONARY ARTERY BYPASS GRAFT N/A 11/23/2017   Procedure: CORONARY ARTERY BYPASS GRAFTING (CABG) x 2, ON PUMP, USING LEFT INTERNAL MAMMARY ARTERY AND RIGHT GREATER SAPHENOUS VEIN HARVESTED ENDOSCOPICALLY;  Surgeon: Gaye Pollack, MD;  Location: [place];  Service: Open Heart Surgery;  Laterality: N/A;   CRYOABLATION     cervical   ESOPHAGOGASTRODUODENOSCOPY (EGD) WITH PROPOFOL N/A 12/02/2019   Procedure: ESOPHAGOGASTRODUODENOSCOPY (EGD) WITH PROPOFOL;  Surgeon: Rush Landmark Telford Nab., MD;  Location: [place];  Service: Gastroenterology;  Laterality: N/A;   ESOPHAGOGASTRODUODENOSCOPY (EGD) WITH PROPOFOL N/A 03/19/2020   Procedure: ESOPHAGOGASTRODUODENOSCOPY (EGD) WITH PROPOFOL;  Surgeon: Milus Banister, MD;  [facility];  Service: Endoscopy;  Laterality: N/A;   EUS N/A 03/19/2020   Procedure: UPPER ENDOSCOPIC ULTRASOUND (EUS) RADIAL;  Surgeon: Milus Banister, MD;  [facility];  Service: Endoscopy;  Laterality: N/A;   EUS N/A 03/19/2020   Procedure: UPPER ENDOSCOPIC ULTRASOUND (EUS) LINEAR;  Surgeon: Milus Banister, MD;  [facility];  Service: Endoscopy;  Laterality: N/A;   FINE NEEDLE ASPIRATION  12/02/2019   Procedure: FINE NEEDLE ASPIRATION (FNA) LINEAR;  Surgeon: Irving Copas., MD;  Location: [place];  Service:  Gastroenterology;;   FINE NEEDLE ASPIRATION N/A 03/19/2020   Procedure: FINE NEEDLE ASPIRATION (FNA) LINEAR;  Surgeon: Milus Banister, MD;  [facility];  Service: Endoscopy;  Laterality: N/A;   IR ANGIO INTRA EXTRACRAN SEL COM CAROTID INNOMINATE UNI R MOD SED  08/21/2017   IR ANGIO VERTEBRAL SEL SUBCLAVIAN INNOMINATE UNI R MOD SED  08/21/2017   IR PERCUTANEOUS ART THROMBECTOMY/INFUSION INTRACRANIAL INC DIAG ANGIO  08/21/2017   IR RADIOLOGIST EVAL & MGMT  10/30/2017   LOOP RECORDER INSERTION N/A 08/28/2017   Procedure: LOOP RECORDER INSERTION;  Surgeon: Constance Haw, MD;  Location: [place] [facility];  Service: Cardiovascular;  Laterality: N/A;   RADIOLOGY WITH ANESTHESIA N/A 08/21/2017   Procedure: RADIOLOGY WITH ANESTHESIA;  Surgeon: Luanne Bras, MD;  Location: [place];  Service: Radiology;  Laterality: N/A;   RIGHT/LEFT HEART CATH AND CORONARY ANGIOGRAPHY N/A 11/20/2017   Procedure: RIGHT/LEFT HEART CATH AND CORONARY ANGIOGRAPHY;  Surgeon: Jettie Booze, MD;  Location: [place] [facility];  Service: Cardiovascular;  Laterality: N/A;   TEE WITHOUT CARDIOVERSION N/A 08/28/2017   Procedure: TRANSESOPHAGEAL ECHOCARDIOGRAM (TEE);  Surgeon: Fay Records, MD;  Location: [place];  Service: Cardiovascular;  Laterality: N/A;   TEE WITHOUT CARDIOVERSION N/A 11/23/2017   Procedure: TRANSESOPHAGEAL ECHOCARDIOGRAM (TEE);  Surgeon: Gaye Pollack, MD;  Location: [place];  Service: Open Heart Surgery;  Laterality: N/A;   TUBAL LIGATION     UPPER ESOPHAGEAL ENDOSCOPIC ULTRASOUND (EUS) N/A 12/02/2019   Procedure: UPPER ESOPHAGEAL ENDOSCOPIC ULTRASOUND (EUS);  Surgeon: Irving Copas., MD;  Location: [place];  Service: Gastroenterology;  Laterality: N/A;   Family History  Problem Relation Age of Onset   Heart attack Father    Hypertension Mother    Hyperlipidemia Mother    Diabetes type II Mother    Urticaria Mother    Hypertension Brother    Breast cancer Cousin  49   Immunodeficiency Neg Hx    Eczema Neg Hx    Atopy Neg Hx    Asthma Neg Hx    Angioedema Neg Hx    Allergic rhinitis Neg Hx    Colon cancer Neg Hx    Esophageal cancer Neg Hx    Stomach cancer Neg Hx    Pancreatic cancer Neg Hx    Colon polyps Neg Hx    Esophageal varices Neg Hx    Inflammatory bowel disease Neg Hx    Rectal cancer Neg Hx    Allergies as of 03/23/2021       Reactions   Hydromorphone Other (See Comments)   Hallucinations Other reaction(s): Hallucination   Lopressor [metoprolol Tartrate] Swelling   Angioedema   Sacubitril-valsartan Swelling, Rash   Welts, possible angioedema Rash   Allegra Allergy [fexofenadine Hcl] Hives   Fexofenadine Hives   Nsaids Other (See Comments)   On Xarelto Not taking d/t abdominal pain   Gabapentin Rash   Gabapentin (once-daily) Rash   Lipitor [atorvastatin] Diarrhea        Medication List        Accurate as of March 23, 2021 11:09 AM. If you have any questions, ask your nurse or doctor.          STOP taking these medications    nitrofurantoin (macrocrystal-monohydrate) 100 MG capsule Commonly known as: Macrobid Stopped by: Howard Pouch, DO       TAKE these medications    baclofen 10 MG tablet Commonly known as: LIORESAL Take 1 tablet (10 mg total) by mouth 3 (three) times daily.   calcium carbonate 500 MG chewable tablet Commonly known as: TUMS - dosed in mg elemental calcium Chew 1 tablet by mouth 2 (two) times daily as needed for indigestion or heartburn.   carvedilol 3.125 MG tablet Commonly known as: COREG Take 1 tablet (3.125 mg total) by mouth in the morning and at bedtime.   cholecalciferol 1000 units tablet Commonly known as: VITAMIN D Take 1,000 Units by mouth daily.   diclofenac Sodium 1 % Gel Commonly known as: VOLTAREN APPLY 2 GRAMS TOPICALLY 4 TIMES A DAY   ferrous sulfate 325 (65 FE) MG tablet Take 325 mg by mouth 2 (two) times a week. Mondays & Thursdays.   hydrOXYzine 10  MG tablet Commonly known as: ATARAX/VISTARIL Take 1 tablet (10 mg total) by mouth at bedtime.   levocetirizine 5 MG tablet Commonly known as: XYZAL Take 1 tablet (5 mg total) by mouth every evening.   pantoprazole 40 MG tablet Commonly known as: PROTONIX Take 1 tablet (40 mg  total) by mouth daily.   pravastatin 20 MG tablet Commonly known as: PRAVACHOL TAKE 1 TABLET BY MOUTH ONCE DAILY IN THE EVENING   rivaroxaban 20 MG Tabs tablet Commonly known as: Xarelto Take 1 tablet daily with evening meal What changed:  how much to take how to take this when to take this additional instructions   selenium 200 MCG Tabs tablet Take by mouth.   SENNA PO Take by mouth.   traMADol 50 MG tablet Commonly known as: ULTRAM Take 1-2 tablets (50-100 mg total) by mouth 2 (two) times daily as needed.   triamcinolone cream 0.1 % Commonly known as: KENALOG Apply topically 4 (four) times daily. Started by: Howard Pouch, DO   vitamin B-12 100 MCG tablet Commonly known as: CYANOCOBALAMIN Take 100 mcg by mouth daily.        All past medical history, surgical history, allergies, family history, immunizations andmedications were updated in the EMR today and reviewed under the history and medication portions of their EMR.     ROS: Negative, with the exception of above mentioned in HPI   Objective:  BP 100/69   Pulse 71   Temp 97.9 F (36.6 C) (Oral)   Ht 5\' 1"  (1.549 m)   Wt 119 lb (54 kg)   LMP  (LMP Unknown) Comment: at age 21  SpO2 97%   BMI 22.48 kg/m  Body mass index is 22.48 kg/m. Gen: Afebrile. No acute distress. Nontoxic in appearance, well developed, well nourished.  HENT: AT. [place].  Eyes:Pupils Equal Round Reactive to light, Extraocular movements intact,  Conjunctiva without redness, discharge or icterus. CV: RRR  Chest: CTAB, no wheeze or crackles.   Abd: Soft. NTND. BS present.  MSK: no CVA TTP. Skin: fine raised rash on hands and fingers. no purpura or petechiae.   Neuro: ambulating independently and well.  PERLA. EOMi. Alert. Oriented x3 Psych: Normal affect, dress and demeanor. Normal speech. Normal thought content and judgment.  No results found. No results found. Results for orders placed or performed in visit on 03/23/21 (from the past 24 hour(s))  POCT Urinalysis Dipstick     Status: None   Collection Time: 03/23/21 10:25 AM  Result Value Ref Range   Color, UA pale yellow    Clarity, UA clear    Glucose, UA Negative Negative   Bilirubin, UA negative    Ketones, UA negative    Spec Grav, UA 1.015 1.010 - 1.025   Blood, UA negative    pH, UA 6.0 5.0 - 8.0   Protein, UA Negative Negative   Urobilinogen, UA 0.2 0.2 or 1.0 E.U./dL   Nitrite, UA negative    Leukocytes, UA Negative Negative   Appearance     Odor      Assessment/Plan: [name] is a 60 y.o. female present for OV for  Recurrent UTI Urine looks better today by POCT. Will send for urx to ensure complete resolution given the quick reoccurrence.  - POCT Urinalysis Dipstick - Urinalysis w microscopic + reflex cultur - consider URO referral if still postive or chronic prophylaxis therapy with macrobid.   Dermatitis Refilled kenalog cream for her today. Contact dermatitis from unknown causes. Discussed common causes today.   Reviewed expectations re: course of current medical issues. Discussed self-management of symptoms. Outlined signs and symptoms indicating need for more acute intervention. Patient verbalized understanding and all questions were answered. Patient received an After-Visit Summary.    Orders Placed This Encounter  Procedures   Urinalysis  w microscopic + reflex cultur   POCT Urinalysis Dipstick   Meds ordered this encounter  Medications   triamcinolone cream (KENALOG) 0.1 %    Sig: Apply topically 4 (four) times daily.    Dispense:  80 g    Refill:  5   Referral Orders  No referral(s) requested today     Note is dictated utilizing  voice recognition software. Although note has been proof read prior to signing, occasional typographical errors still can be missed. If any questions arise, please do not hesitate to call for verification.   electronically signed by:  Howard Pouch, DO  [place]

## 2021-03-23 NOTE — Patient Instructions (Addendum)
Your urine looks normal by point care urine today. We are sending it for culture to be sure. We will call you with lab results.   I am glad you are feeling better.   I also called in the cream for your hands/rash- with refills.

## 2021-03-24 LAB — URINALYSIS W MICROSCOPIC + REFLEX CULTURE
Bacteria, UA: NONE SEEN /HPF
Bilirubin Urine: NEGATIVE
Glucose, UA: NEGATIVE
Hgb urine dipstick: NEGATIVE
Hyaline Cast: NONE SEEN /LPF
Ketones, ur: NEGATIVE
Leukocyte Esterase: NEGATIVE
Nitrites, Initial: NEGATIVE
Protein, ur: NEGATIVE
RBC / HPF: NONE SEEN /HPF (ref 0–2)
Specific Gravity, Urine: 1.006 (ref 1.001–1.035)
Squamous Epithelial / HPF: NONE SEEN /HPF (ref <=5)
WBC, UA: NONE SEEN /HPF (ref 0–5)
pH: 6.5 (ref 5.0–8.0)

## 2021-03-24 LAB — NO CULTURE INDICATED

## 2021-03-25 ENCOUNTER — Other Ambulatory Visit: Payer: Self-pay | Admitting: Family Medicine

## 2021-04-07 ENCOUNTER — Telehealth: Payer: Self-pay | Admitting: Physical Medicine & Rehabilitation

## 2021-04-07 ENCOUNTER — Telehealth: Payer: Self-pay

## 2021-04-07 NOTE — Telephone Encounter (Signed)
Patient has appt 04/09/21 for left hand pain. She is asking for injection. She last received one in may by Dr. Posey Pronto. Please advise

## 2021-04-07 NOTE — Telephone Encounter (Signed)
Patient is aware that Dr. Aretta Nip if out of the office:   [name] complains of throbbing left thumb pain,left ring finger pain & left wrist pain. Pain is a constant level 10. Its so bad its affecting her grip. The pain has been going on for about a month. She wants to know if she can get an injection?  Patient has been advised to make a appointment. Because none of her oral medications are not helping.   Call back ph [phone]

## 2021-04-09 ENCOUNTER — Encounter: Payer: 59 | Attending: Physical Medicine & Rehabilitation | Admitting: Physical Medicine & Rehabilitation

## 2021-04-09 ENCOUNTER — Ambulatory Visit: Payer: 59 | Admitting: Family Medicine

## 2021-04-09 ENCOUNTER — Other Ambulatory Visit: Payer: Self-pay

## 2021-04-09 ENCOUNTER — Encounter: Payer: Self-pay | Admitting: Physical Medicine & Rehabilitation

## 2021-04-09 VITALS — BP 125/66 | HR 88 | Temp 98.1°F | Ht 61.0 in | Wt 121.0 lb

## 2021-04-09 DIAGNOSIS — M170 Bilateral primary osteoarthritis of knee: Secondary | ICD-10-CM | POA: Insufficient documentation

## 2021-04-09 DIAGNOSIS — G5602 Carpal tunnel syndrome, left upper limb: Secondary | ICD-10-CM | POA: Insufficient documentation

## 2021-04-09 NOTE — Progress Notes (Signed)
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 Symptoms are not relieved by conservative care.  Informed consent was obtained after describing risks and benefits of the procedure with the patient. These include bleeding bruising and infection as well as nerve injury. Patient elected to proceed and has given written consent. The distal wrist crease was marked and prepped with Betadine. A 30-gauge 1/2 inch needle was inserted and 0.25 ML's of 1% lidocaine injected into the skin and subcutaneous tissue. 15hz  linear transducer , nerve short axis , needle long axis approach medial to lateral .  Ulnar nerve and artery identified and avoidedThen a 25-gauge 1 and 1/2 inch needle was inserted into the carpal tunnel and 0.25 mL of celestone 6 mg per mL was injected. Patient tolerated procedure well. Post procedure instructions given.

## 2021-04-09 NOTE — Patient Instructions (Signed)
Carpal Tunnel Syndrome  Carpal tunnel syndrome is a condition that causes pain, weakness, and numbness in your hand and arm. Numbness is when you cannot feel an area in your body. The carpal tunnel is a narrow area that is on the palm side of your wrist. Repeated wrist motion or certain diseases may cause swelling in the tunnel. This swelling can pinch the main nerve in the wrist. This nerve is called themedian nerve. What are the causes? This condition may be caused by: Moving your hand and wrist over and over again while doing a task. Injury to the wrist. Arthritis. A sac of fluid (cyst) or abnormal growth (tumor) in the carpal tunnel. Fluid buildup during pregnancy. Use of tools that vibrate. Sometimes the cause is not known. What increases the risk? The following factors may make you more likely to have this condition: Having a job that makes you do these things: Move your hand over and over again. Work with tools that vibrate, such as drills or sanders. Being a woman. Having diabetes, obesity, thyroid problems, or kidney failure. What are the signs or symptoms? Symptoms of this condition include: A tingling feeling in your fingers. Tingling or loss of feeling in your hand. Pain in your entire arm. This pain may get worse when you bend your wrist and elbow for a long time. Pain in your wrist that goes up your arm to your shoulder. Pain that goes down into your palm or fingers. Weakness in your hands. You may find it hard to grab and hold items. You may feel worse at night. How is this treated? This condition may be treated with: Lifestyle changes. You will be asked to stop or change the activity that caused your problem. Doing exercises and activities that make bones, muscles, and tendons stronger (physical therapy). Learning how to use your hand again (occupational therapy). Medicines for pain and swelling. You may have injections in your wrist. A wrist splint or  brace. Surgery. Follow these instructions at home: If you have a splint or brace: Wear the splint or brace as told by your doctor. Take it off only as told by your doctor. Loosen the splint if your fingers: Tingle. Become numb. Turn cold and blue. Keep the splint or brace clean. If the splint or brace is not waterproof: Do not let it get wet. Cover it with a watertight covering when you take a bath or a shower. Managing pain, stiffness, and swelling If told, put ice on the painful area: If you have a removable splint or brace, remove it as told by your doctor. Put ice in a plastic bag. Place a towel between your skin and the bag. Leave the ice on for 20 minutes, 2-3 times per day. Do not fall asleep with the cold pack on your skin. Take off the ice if your skin turns bright red. This is very important. If you cannot feel pain, heat, or cold, you have a greater risk of damage to the area. Move your fingers often to reduce stiffness and swelling. General instructions Take over-the-counter and prescription medicines only as told by your doctor. Rest your wrist from any activity that may cause pain. If needed, talk with your boss at work about changes that can help your wrist heal. Do exercises as told by your doctor, physical therapist, or occupational therapist. Keep all follow-up visits. Contact a doctor if: You have new symptoms. Medicine does not help your pain. Your symptoms get worse. Get help right away  if: You have very bad numbness or tingling in your wrist or hand. Summary Carpal tunnel syndrome is a condition that causes pain in your hand and arm. It is often caused by repeated wrist motions. Lifestyle changes and medicines are used to treat this problem. Surgery may help in very bad cases. Follow your doctor's instructions about wearing a splint, resting your wrist, keeping follow-up visits, and calling for help. This information is not intended to replace advice given  to you by your health care provider. Make sure you discuss any questions you have with your healthcare provider. Document Revised: 01/23/2020 Document Reviewed: 01/23/2020 Elsevier Patient Education  [place].

## 2021-04-13 ENCOUNTER — Telehealth: Payer: Self-pay | Admitting: Family Medicine

## 2021-04-13 NOTE — Telephone Encounter (Signed)
FYI

## 2021-04-13 NOTE — Telephone Encounter (Signed)
FYI: Patient requested Paxlovid due to testing positive for Covid at home with symptoms. VV was offered and patient declined. I next suggested she go to an urgent care facility. Patient became upset and stated she would just have to live with it because no one wants to treat her. Again, I offered same day VV and again she declined. Again, I suggested she go to urgent care but she said her insurance may not cover it. I suggested she call her insurance to find out which urgent care locations are covered. Patient was still angry and said she would call them. Then she said Goodbye and hung up.

## 2021-04-22 ENCOUNTER — Encounter: Payer: 59 | Admitting: Physical Medicine & Rehabilitation

## 2021-04-22 ENCOUNTER — Other Ambulatory Visit: Payer: Self-pay

## 2021-04-22 ENCOUNTER — Encounter: Payer: Self-pay | Admitting: Physical Medicine & Rehabilitation

## 2021-04-22 VITALS — BP 153/94 | HR 101 | Temp 98.7°F | Ht 61.0 in | Wt 116.0 lb

## 2021-04-22 DIAGNOSIS — M170 Bilateral primary osteoarthritis of knee: Secondary | ICD-10-CM | POA: Diagnosis not present

## 2021-04-22 DIAGNOSIS — G5602 Carpal tunnel syndrome, left upper limb: Secondary | ICD-10-CM | POA: Diagnosis not present

## 2021-04-22 NOTE — Progress Notes (Signed)
Indication end-stage osteoarthritis of the knee with pain that limits mobility and does not respond to oral medications.  Ultrasound guidance, 12 Hz linear transducer, long axis view  Medial aspect of the knee was imaged, identified joint space, identified patella, femur, tibia. 25-gauge 1.5 inch needle was inserted under ultrasound guidance 15 hz linear transducer with in plane approach and 1 mL of 1% lidocaine were infiltrated into the skin and subcutaneous tissue. Then a 21-gauge, 2 inch needle was inserted along the same needle track Into the joint under direct ultrasound visualization. 0 cc of joint fluid  6 mL of Synvisc-1 were injected. Patient tolerated procedure well Post procedure instructions given

## 2021-05-06 ENCOUNTER — Encounter: Payer: 59 | Admitting: Physical Medicine & Rehabilitation

## 2021-05-12 ENCOUNTER — Other Ambulatory Visit (INDEPENDENT_AMBULATORY_CARE_PROVIDER_SITE_OTHER): Payer: 59

## 2021-05-12 ENCOUNTER — Other Ambulatory Visit: Payer: Self-pay

## 2021-05-12 DIAGNOSIS — E063 Autoimmune thyroiditis: Secondary | ICD-10-CM | POA: Diagnosis not present

## 2021-05-12 LAB — T3, FREE: T3, Free: 2.9 pg/mL (ref 2.3–4.2)

## 2021-05-12 LAB — T4, FREE: Free T4: 0.79 ng/dL (ref 0.60–1.60)

## 2021-05-12 LAB — TSH: TSH: 3.37 u[IU]/mL (ref 0.35–5.50)

## 2021-05-21 ENCOUNTER — Other Ambulatory Visit: Payer: Self-pay | Admitting: Cardiology

## 2021-05-21 NOTE — Telephone Encounter (Signed)
Prescription refill request for Xarelto received.  Indication: afib  Last office visit: [place], 12/21/2020 Weight: 52.6 kg  Age: 60 yo  Scr: 0.7, 07/14/2020 CrCl:  37m/min   Refill sent

## 2021-06-10 ENCOUNTER — Other Ambulatory Visit: Payer: Self-pay

## 2021-06-11 ENCOUNTER — Encounter: Payer: Self-pay | Admitting: Family Medicine

## 2021-06-11 ENCOUNTER — Other Ambulatory Visit: Payer: Self-pay | Admitting: Family Medicine

## 2021-06-11 ENCOUNTER — Ambulatory Visit (INDEPENDENT_AMBULATORY_CARE_PROVIDER_SITE_OTHER): Payer: 59 | Admitting: Family Medicine

## 2021-06-11 VITALS — BP 109/69 | HR 58 | Temp 98.0°F | Ht 60.0 in | Wt 121.0 lb

## 2021-06-11 DIAGNOSIS — I714 Abdominal aortic aneurysm, without rupture, unspecified: Secondary | ICD-10-CM

## 2021-06-11 DIAGNOSIS — I509 Heart failure, unspecified: Secondary | ICD-10-CM

## 2021-06-11 DIAGNOSIS — E559 Vitamin D deficiency, unspecified: Secondary | ICD-10-CM

## 2021-06-11 DIAGNOSIS — Z23 Encounter for immunization: Secondary | ICD-10-CM

## 2021-06-11 DIAGNOSIS — I214 Non-ST elevation (NSTEMI) myocardial infarction: Secondary | ICD-10-CM | POA: Diagnosis not present

## 2021-06-11 DIAGNOSIS — Z1231 Encounter for screening mammogram for malignant neoplasm of breast: Secondary | ICD-10-CM | POA: Diagnosis not present

## 2021-06-11 DIAGNOSIS — Z0000 Encounter for general adult medical examination without abnormal findings: Secondary | ICD-10-CM

## 2021-06-11 DIAGNOSIS — Z131 Encounter for screening for diabetes mellitus: Secondary | ICD-10-CM | POA: Diagnosis not present

## 2021-06-11 DIAGNOSIS — Z1211 Encounter for screening for malignant neoplasm of colon: Secondary | ICD-10-CM | POA: Diagnosis not present

## 2021-06-11 DIAGNOSIS — E785 Hyperlipidemia, unspecified: Secondary | ICD-10-CM | POA: Diagnosis not present

## 2021-06-11 DIAGNOSIS — F419 Anxiety disorder, unspecified: Secondary | ICD-10-CM

## 2021-06-11 DIAGNOSIS — I63512 Cerebral infarction due to unspecified occlusion or stenosis of left middle cerebral artery: Secondary | ICD-10-CM | POA: Diagnosis not present

## 2021-06-11 DIAGNOSIS — I5032 Chronic diastolic (congestive) heart failure: Secondary | ICD-10-CM

## 2021-06-11 DIAGNOSIS — E611 Iron deficiency: Secondary | ICD-10-CM | POA: Diagnosis not present

## 2021-06-11 DIAGNOSIS — Z01419 Encounter for gynecological examination (general) (routine) without abnormal findings: Secondary | ICD-10-CM | POA: Diagnosis not present

## 2021-06-11 DIAGNOSIS — I10 Essential (primary) hypertension: Secondary | ICD-10-CM

## 2021-06-11 DIAGNOSIS — M8580 Other specified disorders of bone density and structure, unspecified site: Secondary | ICD-10-CM

## 2021-06-11 DIAGNOSIS — I700 Atherosclerosis of aorta: Secondary | ICD-10-CM

## 2021-06-11 DIAGNOSIS — I25810 Atherosclerosis of coronary artery bypass graft(s) without angina pectoris: Secondary | ICD-10-CM

## 2021-06-11 DIAGNOSIS — G8110 Spastic hemiplegia affecting unspecified side: Secondary | ICD-10-CM

## 2021-06-11 DIAGNOSIS — Z951 Presence of aortocoronary bypass graft: Secondary | ICD-10-CM

## 2021-06-11 DIAGNOSIS — I429 Cardiomyopathy, unspecified: Secondary | ICD-10-CM

## 2021-06-11 DIAGNOSIS — K219 Gastro-esophageal reflux disease without esophagitis: Secondary | ICD-10-CM

## 2021-06-11 DIAGNOSIS — I482 Chronic atrial fibrillation, unspecified: Secondary | ICD-10-CM

## 2021-06-11 DIAGNOSIS — N6310 Unspecified lump in the right breast, unspecified quadrant: Secondary | ICD-10-CM

## 2021-06-11 DIAGNOSIS — E063 Autoimmune thyroiditis: Secondary | ICD-10-CM

## 2021-06-11 LAB — LIPID PANEL
Cholesterol: 240 mg/dL — ABNORMAL HIGH (ref 0–200)
HDL: 43.4 mg/dL (ref >39.00)
NonHDL: 196.37
Total CHOL/HDL Ratio: 6
Triglycerides: 269 mg/dL — ABNORMAL HIGH (ref 0.0–149.0)
VLDL: 53.8 mg/dL — ABNORMAL HIGH (ref 0.0–40.0)

## 2021-06-11 LAB — CBC WITH DIFFERENTIAL/PLATELET
Basophils Absolute: 0.1 10*3/uL (ref 0.0–0.1)
Basophils Relative: 1.4 % (ref 0.0–3.0)
Eosinophils Absolute: 0.3 10*3/uL (ref 0.0–0.7)
Eosinophils Relative: 3.3 % (ref 0.0–5.0)
HCT: 44.4 % (ref 36.0–46.0)
Hemoglobin: 14.5 g/dL (ref 12.0–15.0)
Lymphocytes Relative: 27.1 % (ref 12.0–46.0)
Lymphs Abs: 2.3 10*3/uL (ref 0.7–4.0)
MCHC: 32.7 g/dL (ref 30.0–36.0)
MCV: 96.9 fl (ref 78.0–100.0)
Monocytes Absolute: 1 10*3/uL (ref 0.1–1.0)
Monocytes Relative: 12.1 % — ABNORMAL HIGH (ref 3.0–12.0)
Neutro Abs: 4.7 10*3/uL (ref 1.4–7.7)
Neutrophils Relative %: 56.1 % (ref 43.0–77.0)
Platelets: 328 10*3/uL (ref 150.0–400.0)
RBC: 4.59 Mil/uL (ref 3.87–5.11)
RDW: 14.1 % (ref 11.5–15.5)
WBC: 8.3 10*3/uL (ref 4.0–10.5)

## 2021-06-11 LAB — VITAMIN D 25 HYDROXY (VIT D DEFICIENCY, FRACTURES): VITD: 29.85 ng/mL — ABNORMAL LOW (ref 30.00–100.00)

## 2021-06-11 LAB — COMPREHENSIVE METABOLIC PANEL
ALT: 12 U/L (ref 0–35)
AST: 17 U/L (ref 0–37)
Albumin: 4.1 g/dL (ref 3.5–5.2)
Alkaline Phosphatase: 68 U/L (ref 39–117)
BUN: 20 mg/dL (ref 6–23)
CO2: 25 mEq/L (ref 19–32)
Calcium: 9.9 mg/dL (ref 8.4–10.5)
Chloride: 103 mEq/L (ref 96–112)
Creatinine, Ser: 0.74 mg/dL (ref 0.40–1.20)
GFR: 87.5 mL/min (ref >60.00)
Glucose, Bld: 83 mg/dL (ref 70–99)
Potassium: 4.5 mEq/L (ref 3.5–5.1)
Sodium: 138 mEq/L (ref 135–145)
Total Bilirubin: 0.3 mg/dL (ref 0.2–1.2)
Total Protein: 6.9 g/dL (ref 6.0–8.3)

## 2021-06-11 LAB — TSH: TSH: 4.02 u[IU]/mL (ref 0.35–5.50)

## 2021-06-11 LAB — T4, FREE: Free T4: 0.62 ng/dL (ref 0.60–1.60)

## 2021-06-11 LAB — HEMOGLOBIN A1C: Hgb A1c MFr Bld: 5.8 % (ref 4.6–6.5)

## 2021-06-11 LAB — LDL CHOLESTEROL, DIRECT: Direct LDL: 155 mg/dL

## 2021-06-11 LAB — MAGNESIUM: Magnesium: 2.1 mg/dL (ref 1.5–2.5)

## 2021-06-11 MED ORDER — CARVEDILOL 3.125 MG PO TABS: 3.1250 mg | ORAL_TABLET | Freq: Two times a day (BID) | ORAL | 1 refills | Status: DC

## 2021-06-11 MED ORDER — PANTOPRAZOLE SODIUM 40 MG PO TBEC: 40.0000 mg | DELAYED_RELEASE_TABLET | Freq: Every day | ORAL | 1 refills | Status: DC

## 2021-06-11 MED ORDER — TRAMADOL HCL 50 MG PO TABS: 50.0000 mg | ORAL_TABLET | Freq: Two times a day (BID) | ORAL | 5 refills | Status: DC | PRN

## 2021-06-11 MED ORDER — HYDROXYZINE HCL 10 MG PO TABS: 10.0000 mg | ORAL_TABLET | Freq: Every day | ORAL | 1 refills | Status: DC

## 2021-06-11 MED ORDER — RIVAROXABAN 20 MG PO TABS: ORAL_TABLET | ORAL | 5 refills | Status: DC

## 2021-06-11 MED ORDER — BACLOFEN 10 MG PO TABS: 10.0000 mg | ORAL_TABLET | Freq: Three times a day (TID) | ORAL | 5 refills | Status: DC

## 2021-06-11 MED ORDER — PRAVASTATIN SODIUM 20 MG PO TABS: 20.0000 mg | ORAL_TABLET | Freq: Every evening | ORAL | 3 refills | Status: DC

## 2021-06-11 NOTE — Progress Notes (Signed)
Refilled tramadol.

## 2021-06-11 NOTE — Progress Notes (Signed)
This visit occurred during the SARS-CoV-2 public health emergency.  Safety protocols were in place, including screening questions prior to the visit, additional usage of staff PPE, and extensive cleaning of exam room while observing appropriate contact time as indicated for disinfecting solutions.    Patient ID: [name], female  DOB: 10-09-1960, 60 y.o.   MRN: [id number] Patient Care Team    Relationship Specialty Notifications Start End  [name], DO PCP - General Family Medicine  10/13/17   Constance Haw, MD PCP - Electrophysiology Cardiology Admissions 08/28/17   Satira Sark, MD PCP - Cardiology Cardiology Admissions 12/15/17   Rosalin Hawking, MD Consulting Physician Neurology  10/13/17   Jamse Arn, MD Consulting Physician Physical Medicine and Rehabilitation  10/13/17   Luanne Bras, MD Consulting Physician Interventional Radiology  10/13/17   Mansouraty, Telford Nab., MD Consulting Physician Gastroenterology  03/03/20   Armandina Gemma, MD Consulting Physician General Surgery  04/09/20   Lucas Mallow, MD Consulting Physician Urology  04/09/20     Chief Complaint  Patient presents with   Annual Exam    Pt is fasting    Subjective: [name] is a 60 y.o.  Female  present for CPE/cmc. All past medical history, surgical history, allergies, family history, immunizations, medications and social history were updated in the electronic medical record today. All recent labs, ED visits and hospitalizations within the last year were reviewed.  Health maintenance:  Colonoscopy: never. Pt of [name] Landmark referral placed today Mammogram: completed:10/06/2020, [facility].  Palpable new mass right breast.  Diagnostic mammo and ultrasound ordered today. Cervical cancer screening: Referred to gynecology today. Immunizations: tdap 2018 utd, Influenza completed today (encouraged yearly), PNA20> completed at nurse visit with 2nd shingrix in 3 mos., zostavax #1  completed today> #2 and PNA20 at nurse visit 3 mos.  Infectious disease screening: HIV completed, Hep C completed DEXA: last completed 02/2018> osteopenia, ordered today  Assistive device: cane Oxygen [id number] Patient has a Dental home. Hospitalizations/ED visits: reviewed  H/o NSTEMI (non-ST elevated myocardial infarction) (HCC)/Coronary atherosclerosis of autologous artery bypass graft without angina/Benign essential HTN/Chronic diastolic CHF (congestive heart failure) (HCC)/Atrial fibrillation, chronic (HCC)/Atherosclerosis of aorta (HCC)/HLD Pt reports compliance with Coreg twice daily, statin and Xarelto. Blood pressures ranges at home from. Patient denies chest pain, shortness of breath, dizziness or lower extremity edema.   Anxiety Patient reports Vistaril nightly is very helpful     Left middle cerebral artery stroke (HCC)/Spastic hemiparesis affecting dominant side (HCC)/Cerebrovascular accident (CVA) due to occlusion of left middle cerebral artery [facility]) Patient following with physical/rehab.  She reports use of baclofen 3 times daily is helpful to prevent spasms reoccurring.  She takes tramadol 50-100 mg usually before bed but sometimes twice daily.  [place] secondary to her stroke and arthritis.  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  She reports overall she is feeling much improved.   Depression screen [facility] 2/9 07/13/2020 05/05/2020  Decreased Interest 0 0  Down, Depressed, Hopeless 0 0  PHQ - 2 Score 0 0  Some recent data might be hidden   GAD 7 : Generalized Anxiety Score 10/22/2018 11/10/2017  Nervous, Anxious, on Edge 0 0  Control/stop worrying 0 0  Worry too much - different things 0 0  Trouble relaxing 0 0  Restless 0 0  Easily annoyed or irritable 0 0  Afraid - awful might happen 0 0  Total GAD 7 Score 0  0  Anxiety Difficulty Not difficult at all Not difficult at all    Immunization History  Administered Date(s) Administered    Influenza,inj,Quad PF,6+ Mos 10/13/2017, 10/22/2018, 07/01/2019, 06/16/2020, 06/11/2021   Influenza,inj,quad, With Preservative 08/24/2017   Moderna SARS-COV2 Booster Vaccination 03/19/2021   Moderna Sars-Covid-2 Vaccination 01/10/2020, 02/11/2020   Zoster Recombinat (Shingrix) 06/11/2021    Past Medical History:  Diagnosis Date   AAA (abdominal aortic aneurysm) ([place])    a. 3.3cm infrarenal by CT 02/2020.   Abnormal findings on endoscopic ultrasound (EUS) 01/25/2020   Abnormality present on pathology (Atypical Pathology) 01/25/2020   Angio-edema    Angioedema    Arthritis    CAD (coronary artery disease)    a. s/p NSTEMI in 10/2017 with cath showing LM disease --> s/p CABG on 11/23/2017 with LIMA-LAD and SVG-OM.    Carotid stenosis    a. Duplex 11/2018 [id number] RICA, [id number] LICA.   Closed nondisplaced fracture of greater trochanter of right femur ([place]) 04/09/2020   Cystocele, unspecified  03/03/2020   Gallbladder sludge and cholelithiasis without cholecystitis 01/25/2020   Small stones of the gallbladder appreciated on CT 02/2020 as well.   Gastritis and duodenitis 12/06/2019   11/2019 Peptic duodenitis  -  No dysplasia or malignancy identified  B. STOMACH, BIOPSY:  -  Mild chronic gastritis with intestinal metaplasia    Hay fever    History of kidney stones    Idiopathic recurrent acute pancreatitis 01/25/2020   Ischemic cardiomyopathy    a. EF 35-40% by echo in 10/2017. b. 55% by echo 01/2020.   Kidney stone 02/18/2020   Lytic bone lesion of femur    NSTEMI (non-ST elevated myocardial infarction) (HCC)    PAF (paroxysmal atrial fibrillation) ([place])    Diagnosed by ILR   Pancreatitis    Pancreatitis, recurrent 11/07/2019   Pilonidal cyst 10/21/2019   Pneumonia 11/16/2017   Stroke ([place]) 08/23/2017   Left MCA infarct status post TPA and thrombectomy   Urticaria    Allergies  Allergen Reactions   Hydromorphone Other (See Comments)    Hallucinations Other reaction(s): Hallucination   Lopressor  [Metoprolol Tartrate] Swelling    Angioedema   Sacubitril-Valsartan Swelling and Rash    Welts, possible angioedema  Rash   Allegra Allergy [Fexofenadine Hcl] Hives   Fexofenadine Hives   Nsaids Other (See Comments)    On Xarelto Not taking d/t abdominal pain   Gabapentin Rash   Gabapentin (Once-Daily) Rash   Lipitor [Atorvastatin] Diarrhea   Past Surgical History:  Procedure Laterality Date   BIOPSY  12/02/2019   Procedure: BIOPSY;  Surgeon: Irving Copas., MD;  Location: [place];  Service: Gastroenterology;;   CHOLECYSTECTOMY N/A 04/30/2020   Procedure: LAPAROSCOPIC CHOLECYSTECTOMY WITH INTRAOPERATIVE CHOLANGIOGRAM;  Surgeon: Armandina Gemma, MD;  Location: [facility];  Service: General;  Laterality: N/A;   CORONARY ARTERY BYPASS GRAFT N/A 11/23/2017   Procedure: CORONARY ARTERY BYPASS GRAFTING (CABG) x 2, ON PUMP, USING LEFT INTERNAL MAMMARY ARTERY AND RIGHT GREATER SAPHENOUS VEIN HARVESTED ENDOSCOPICALLY;  Surgeon: Gaye Pollack, MD;  Location: [place];  Service: Open Heart Surgery;  Laterality: N/A;   CRYOABLATION     cervical   ESOPHAGOGASTRODUODENOSCOPY (EGD) WITH PROPOFOL N/A 12/02/2019   Procedure: ESOPHAGOGASTRODUODENOSCOPY (EGD) WITH PROPOFOL;  Surgeon: Rush Landmark Telford Nab., MD;  Location: [place];  Service: Gastroenterology;  Laterality: N/A;   ESOPHAGOGASTRODUODENOSCOPY (EGD) WITH PROPOFOL N/A 03/19/2020   Procedure: ESOPHAGOGASTRODUODENOSCOPY (EGD) WITH PROPOFOL;  Surgeon: Milus Banister, MD;  [facility];  Service:  Endoscopy;  Laterality: N/A;   EUS N/A 03/19/2020   Procedure: UPPER ENDOSCOPIC ULTRASOUND (EUS) RADIAL;  Surgeon: Milus Banister, MD;  [facility];  Service: Endoscopy;  Laterality: N/A;   EUS N/A 03/19/2020   Procedure: UPPER ENDOSCOPIC ULTRASOUND (EUS) LINEAR;  Surgeon: Milus Banister, MD;  [facility];  Service: Endoscopy;  Laterality: N/A;   FINE NEEDLE ASPIRATION  12/02/2019   Procedure: FINE NEEDLE ASPIRATION  (FNA) LINEAR;  Surgeon: Irving Copas., MD;  Location: [place];  Service: Gastroenterology;;   FINE NEEDLE ASPIRATION N/A 03/19/2020   Procedure: FINE NEEDLE ASPIRATION (FNA) LINEAR;  Surgeon: Milus Banister, MD;  [facility];  Service: Endoscopy;  Laterality: N/A;   IR ANGIO INTRA EXTRACRAN SEL COM CAROTID INNOMINATE UNI R MOD SED  08/21/2017   IR ANGIO VERTEBRAL SEL SUBCLAVIAN INNOMINATE UNI R MOD SED  08/21/2017   IR PERCUTANEOUS ART THROMBECTOMY/INFUSION INTRACRANIAL INC DIAG ANGIO  08/21/2017   IR RADIOLOGIST EVAL & MGMT  10/30/2017   LOOP RECORDER INSERTION N/A 08/28/2017   Procedure: LOOP RECORDER INSERTION;  Surgeon: Constance Haw, MD;  Location: [place] [facility];  Service: Cardiovascular;  Laterality: N/A;   RADIOLOGY WITH ANESTHESIA N/A 08/21/2017   Procedure: RADIOLOGY WITH ANESTHESIA;  Surgeon: Luanne Bras, MD;  Location: [place];  Service: Radiology;  Laterality: N/A;   RIGHT/LEFT HEART CATH AND CORONARY ANGIOGRAPHY N/A 11/20/2017   Procedure: RIGHT/LEFT HEART CATH AND CORONARY ANGIOGRAPHY;  Surgeon: Jettie Booze, MD;  Location: [place] [facility];  Service: Cardiovascular;  Laterality: N/A;   TEE WITHOUT CARDIOVERSION N/A 08/28/2017   Procedure: TRANSESOPHAGEAL ECHOCARDIOGRAM (TEE);  Surgeon: Fay Records, MD;  Location: [place];  Service: Cardiovascular;  Laterality: N/A;   TEE WITHOUT CARDIOVERSION N/A 11/23/2017   Procedure: TRANSESOPHAGEAL ECHOCARDIOGRAM (TEE);  Surgeon: Gaye Pollack, MD;  Location: [place];  Service: Open Heart Surgery;  Laterality: N/A;   TUBAL LIGATION     UPPER ESOPHAGEAL ENDOSCOPIC ULTRASOUND (EUS) N/A 12/02/2019   Procedure: UPPER ESOPHAGEAL ENDOSCOPIC ULTRASOUND (EUS);  Surgeon: Irving Copas., MD;  Location: [place];  Service: Gastroenterology;  Laterality: N/A;   Family History  Problem Relation Age of Onset   Heart attack Father    Hypertension Mother    Hyperlipidemia Mother     Diabetes type II Mother    Urticaria Mother    Hypertension Brother    Breast cancer Cousin 52   Immunodeficiency Neg Hx    Eczema Neg Hx    Atopy Neg Hx    Asthma Neg Hx    Angioedema Neg Hx    Allergic rhinitis Neg Hx    Colon cancer Neg Hx    Esophageal cancer Neg Hx    Stomach cancer Neg Hx    Pancreatic cancer Neg Hx    Colon polyps Neg Hx    Esophageal varices Neg Hx    Inflammatory bowel disease Neg Hx    Rectal cancer Neg Hx    Social History   Social History Narrative   Married. 2 children.    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    Walks with cane or wheelchair.    Former smoker.    Drinks caffeine.    Smoke alarm in the home.   Firearms in the home.    Wears her seatbelt.    Feels safe In her relationships.     Allergies as of 06/11/2021       Reactions   Hydromorphone Other (See Comments)  Hallucinations Other reaction(s): Hallucination   Lopressor [metoprolol Tartrate] Swelling   Angioedema   Sacubitril-valsartan Swelling, Rash   Welts, possible angioedema Rash   Allegra Allergy [fexofenadine Hcl] Hives   Fexofenadine Hives   Nsaids Other (See Comments)   On Xarelto Not taking d/t abdominal pain   Gabapentin Rash   Gabapentin (once-daily) Rash   Lipitor [atorvastatin] Diarrhea        Medication List        Accurate as of June 11, 2021  5:52 PM. If you have any questions, ask your nurse or doctor.          STOP taking these medications    azithromycin 250 MG tablet Commonly known as: ZITHROMAX Stopped by: Howard Pouch, DO       TAKE these medications    baclofen 10 MG tablet Commonly known as: LIORESAL Take 1 tablet (10 mg total) by mouth 3 (three) times daily.   calcium carbonate 500 MG chewable tablet Commonly known as: TUMS - dosed in mg elemental calcium Chew 1 tablet by mouth 2 (two) times daily as needed for indigestion or heartburn.   carvedilol 3.125 MG tablet Commonly known as: COREG Take 1 tablet (3.125 mg  total) by mouth in the morning and at bedtime.   cholecalciferol 1000 units tablet Commonly known as: VITAMIN D Take 1,000 Units by mouth daily.   diclofenac Sodium 1 % Gel Commonly known as: VOLTAREN APPLY 2 GRAMS TOPICALLY 4 TIMES A DAY   ferrous sulfate 325 (65 FE) MG tablet Take 325 mg by mouth 2 (two) times a week. Mondays & Thursdays.   hydrOXYzine 10 MG tablet Commonly known as: ATARAX/VISTARIL Take 1 tablet (10 mg total) by mouth at bedtime.   levocetirizine 5 MG tablet Commonly known as: XYZAL Take 1 tablet (5 mg total) by mouth every evening.   pantoprazole 40 MG tablet Commonly known as: PROTONIX Take 1 tablet (40 mg total) by mouth daily.   pravastatin 20 MG tablet Commonly known as: PRAVACHOL Take 1 tablet (20 mg total) by mouth every evening.   rivaroxaban 20 MG Tabs tablet Commonly known as: Xarelto TAKE 1 TABLET BY MOUTH ONCE DAILY WITH EVENING MEAL   selenium 200 MCG Tabs tablet Take by mouth.   SENNA PO Take by mouth.   traMADol 50 MG tablet Commonly known as: ULTRAM Take 1-2 tablets (50-100 mg total) by mouth 2 (two) times daily as needed.   triamcinolone cream 0.1 % Commonly known as: KENALOG Apply topically 4 (four) times daily.   vitamin B-12 100 MCG tablet Commonly known as: CYANOCOBALAMIN Take 100 mcg by mouth daily.        All past medical history, surgical history, allergies, family history, immunizations andmedications were updated in the EMR today and reviewed under the history and medication portions of their EMR.        ROS: 14 pt review of systems performed and negative (unless mentioned in an HPI)  Objective: BP 109/69   Pulse (!) 58   Temp 98 F (36.7 C) (Oral)   Ht 5' (1.524 m)   Wt 121 lb (54.9 kg)   LMP  (LMP Unknown) Comment: at age 11  SpO2 95%   BMI 23.63 kg/m  Gen: Afebrile. No acute distress. Nontoxic in appearance, well-developed, well-nourished,  very pleasant female.  HENT: AT. [place]. Bilateral TM  visualized and normal in appearance, normal external auditory canal. MMM, no oral lesions, adequate dentition. Bilateral nares within normal limits. Throat without erythema, ulcerations  or exudates. no Cough on exam, no hoarseness on exam. Eyes:Pupils Equal Round Reactive to light, Extraocular movements intact,  Conjunctiva without redness, discharge or icterus. Neck/lymp/endocrine: Supple,no lymphadenopathy, no thyromegaly CV: RRR no murmur, no edema, +2/4 P posterior tibialis pulses.  Chest: CTAB, no wheeze, rhonchi or crackles. normal Respiratory effort. good Air movement. Abd: Soft. flat. NTND. BS present. no Masses palpated. No hepatosplenomegaly. No rebound tenderness or guarding. Skin: no rashes, purpura or petechiae. Warm and well-perfused. Skin intact. Neuro/Msk: ambulates well w/out assistance today. PERLA. EOMi. Alert. Oriented x3.   Psych: Normal affect, dress and demeanor. Normal speech. Normal thought content and judgment. Breast exam: : left breast normal without mass, skin or nipple changes or axillary nodes.  Breast does have scarring lower medial aspect of breast from loop recorder placement.  Abnormal mass palpated right breast 12 o'clock position approximately 3 inches superior areola, nontender, hard palpable mass approximately 1.5 cm x 1.5 cm.  No erythema.  No skin changes.  No lymphadenopathy of the axilla bilaterally.,   No results found.  Assessment/plan: [name] is a 60 y.o. 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 Continue pravastatin 20 mg daily-unable to tolerate higher doses or other statins> managed  by cardiology. Continue Coreg 3.125 mg twice daily - Comprehensive metabolic panel - CBC with Differential/Platelet - Lipid panel - TSH - T4, free - T3 - Magnesium Continue Xarelto 20 mg daily   Anxiety/allergies Stable Continue hydroxyzine 10 mg daily at bedtime Continue Xyzal daily-evening   Abdominal aortic aneurysm (AAA) without rupture (HCC) Has been stable, repeat imaging due 2024   Left middle cerebral artery stroke (HCC)/Spastic hemiparesis affecting dominant side (HCC)/Cerebrovascular accident (CVA) due to occlusion of left middle cerebral artery (HCC) Stable Continue statin and chronic anticoagulation with Xarelto. Continue baclofen 3 times daily Continue tramadol 50-100 mg twice daily as needed.  [place] controlled substance database reviewed and appropriate.   GERD: Stable Continue Protonix   Iron deficiency - Iron, TIBC and Ferritin Panel Hashimoto's thyroiditis Currently not on medications  Colon cancer screening - Ambulatory referral to Gastroenterology Visit for pelvic exam - Ambulatory referral to Obstetrics / Gynecology Diabetes mellitus screening - Hemoglobin A1c Osteopenia, unspecified location/Vitamin D deficiency - DG Bone Density; Future -Vitamin D levels collected today Breast mass, right New palpable mass of right breast 12 o'clock position approximately 3 inches above areola concerning for malignancy. - MM Digital Diagnostic Bilat; Future - US BREAST LTD UNI RIGHT INC AXILLA; Future  Influenza vaccine needed - Flu Vaccine QUAD 6+ mos PF IM (Fluarix Quad PF)  Routine general medical examination at a health care facility Colonoscopy: never. Pt of [name] Landmark referral placed today Mammogram: completed:10/06/2020, [facility].  Palpable new mass right breast.  Diagnostic mammo and ultrasound ordered today. Cervical cancer screening: Referred to gynecology today. Immunizations: tdap 2018 utd, Influenza completed today (encouraged  yearly), PNA20> completed at nurse visit with 2nd shingrix in 3 mos., zostavax #1 completed today> #2 and PNA20 at nurse visit 3 mos.  Infectious disease screening: HIV completed, Hep C completed DEXA: last completed 02/2018> osteopenia, ordered today  Patient was encouraged to exercise greater than 150 minutes a week. Patient was encouraged to choose a diet filled with fresh fruits and vegetables, and lean meats. AVS provided to patient today  for education/recommendation on gender specific health and safety maintenance. Return in about 5 months (around 11/25/2021) for [place] (30 min) provider appt and nurse visit 3 mos. pna20 and shingrix #2.  Orders Placed This Encounter  Procedures   DG Bone Density   MM Digital Diagnostic Bilat   US BREAST LTD UNI RIGHT INC AXILLA   Flu Vaccine QUAD 6+ mos PF IM (Fluarix Quad PF)   Varicella-zoster vaccine IM   Hemoglobin A1c   Comprehensive metabolic panel   CBC with Differential/Platelet   Lipid panel   TSH   T4, free   T3   Iron, TIBC and Ferritin Panel   Vitamin D (25 hydroxy)   Magnesium   LDL cholesterol, direct   Ambulatory referral to Gastroenterology   Ambulatory referral to Obstetrics / Gynecology    Orders Placed This Encounter  Procedures   DG Bone Density   MM Digital Diagnostic Bilat   US BREAST LTD UNI RIGHT INC AXILLA   Flu Vaccine QUAD 6+ mos PF IM (Fluarix Quad PF)   Varicella-zoster vaccine IM   Hemoglobin A1c   Comprehensive metabolic panel   CBC with Differential/Platelet   Lipid panel   TSH   T4, free   T3   Iron, TIBC and Ferritin Panel   Vitamin D (25 hydroxy)   Magnesium   LDL cholesterol, direct   Ambulatory referral to Gastroenterology   Ambulatory referral to Obstetrics / Gynecology   Meds ordered this encounter  Medications   baclofen (LIORESAL) 10 MG tablet    Sig: Take 1 tablet (10 mg total) by mouth 3 (three) times daily.    Dispense:  90 tablet    Refill:  5   carvedilol (COREG) 3.125 MG tablet     Sig: Take 1 tablet (3.125 mg total) by mouth in the morning and at bedtime.    Dispense:  180 tablet    Refill:  1   hydrOXYzine (ATARAX/VISTARIL) 10 MG tablet    Sig: Take 1 tablet (10 mg total) by mouth at bedtime.    Dispense:  90 tablet    Refill:  1   pantoprazole (PROTONIX) 40 MG tablet    Sig: Take 1 tablet (40 mg total) by mouth daily.    Dispense:  90 tablet    Refill:  1   pravastatin (PRAVACHOL) 20 MG tablet    Sig: Take 1 tablet (20 mg total) by mouth every evening.    Dispense:  90 tablet    Refill:  3    Pt needs follow up visit for further refills.   rivaroxaban (XARELTO) 20 MG TABS tablet    Sig: TAKE 1 TABLET BY MOUTH ONCE DAILY WITH EVENING MEAL    Dispense:  30 tablet    Refill:  5   Referral Orders         Ambulatory referral to Gastroenterology         Ambulatory referral to Obstetrics / Gynecology       Electronically signed by: Howard Pouch, DO [place]

## 2021-06-11 NOTE — Patient Instructions (Signed)
Great to see you today.  I have refilled the medication(s) we provide.   If labs were collected, we will inform you of lab results once received either by echart message or telephone call.   - echart message- for normal results that have been seen by the patient already.   - telephone call: abnormal results or if patient has not viewed results in their echart. Health Maintenance, Female Adopting a healthy lifestyle and getting preventive care are important in promoting health and wellness. Ask your health care provider about: The right schedule for you to have regular tests and exams. Things you can do on your own to prevent diseases and keep yourself healthy. What should I know about diet, weight, and exercise? Eat a healthy diet  Eat a diet that includes plenty of vegetables, fruits, low-fat dairy products, and lean protein. Do not eat a lot of foods that are high in solid fats, added sugars, or sodium. Maintain a healthy weight Body mass index (BMI) is used to identify weight problems. It estimates body fat based on height and weight. Your health care provider can help determine your BMI and help you achieve or maintain a healthy weight. Get regular exercise Get regular exercise. This is one of the most important things you can do for your health. Most adults should: Exercise for at least 150 minutes each week. The exercise should increase your heart rate and make you sweat (moderate-intensity exercise). Do strengthening exercises at least twice a week. This is in addition to the moderate-intensity exercise. Spend less time sitting. Even light physical activity can be beneficial. Watch cholesterol and blood lipids Have your blood tested for lipids and cholesterol at 61 years of age, then have this test every 5 years. Have your cholesterol levels checked more often if: Your lipid or cholesterol levels are high. You are older than 61 years of age. You are at high risk for heart  disease. What should I know about cancer screening? Depending on your health history and family history, you may need to have cancer screening at various ages. This may include screening for: Breast cancer. Cervical cancer. Colorectal cancer. Skin cancer. Lung cancer. What should I know about heart disease, diabetes, and high blood pressure? Blood pressure and heart disease High blood pressure causes heart disease and increases the risk of stroke. This is more likely to develop in people who have high blood pressure readings, are of African descent, or are overweight. Have your blood pressure checked: Every 3-5 years if you are 18-39 years of age. Every year if you are 40 years old or older. Diabetes Have regular diabetes screenings. This checks your fasting blood sugar level. Have the screening done: Once every three years after age 40 if you are at a normal weight and have a low risk for diabetes. More often and at a younger age if you are overweight or have a high risk for diabetes. What should I know about preventing infection? Hepatitis B If you have a higher risk for hepatitis B, you should be screened for this virus. Talk with your health care provider to find out if you are at risk for hepatitis B infection. Hepatitis C Testing is recommended for: Everyone born from 1945 through 1965. Anyone with known risk factors for hepatitis C. Sexually transmitted infections (STIs) Get screened for STIs, including gonorrhea and chlamydia, if: You are sexually active and are younger than 61 years of age. You are older than 61 years of age and your   health care provider tells you that you are at risk for this type of infection. Your sexual activity has changed since you were last screened, and you are at increased risk for chlamydia or gonorrhea. Ask your health care provider if you are at risk. Ask your health care provider about whether you are at high risk for HIV. Your health care provider  may recommend a prescription medicine to help prevent HIV infection. If you choose to take medicine to prevent HIV, you should first get tested for HIV. You should then be tested every 3 months for as long as you are taking the medicine. Pregnancy If you are about to stop having your period (premenopausal) and you may become pregnant, seek counseling before you get pregnant. Take 400 to 800 micrograms (mcg) of folic acid every day if you become pregnant. Ask for birth control (contraception) if you want to prevent pregnancy. Osteoporosis and menopause Osteoporosis is a disease in which the bones lose minerals and strength with aging. This can result in bone fractures. If you are 65 years old or older, or if you are at risk for osteoporosis and fractures, ask your health care provider if you should: Be screened for bone loss. Take a calcium or vitamin D supplement to lower your risk of fractures. Be given hormone replacement therapy (HRT) to treat symptoms of menopause. Follow these instructions at home: Lifestyle Do not use any products that contain nicotine or tobacco, such as cigarettes, e-cigarettes, and chewing tobacco. If you need help quitting, ask your health care provider. Do not use street drugs. Do not share needles. Ask your health care provider for help if you need support or information about quitting drugs. Alcohol use Do not drink alcohol if: Your health care provider tells you not to drink. You are pregnant, may be pregnant, or are planning to become pregnant. If you drink alcohol: Limit how much you use to 0-1 drink a day. Limit intake if you are breastfeeding. Be aware of how much alcohol is in your drink. In the [place] one drink equals one 12 oz bottle of beer (355 mL), one 5 oz glass of wine (148 mL), or one 1 oz glass of hard liquor (44 mL). General instructions Schedule regular health, dental, and eye exams. Stay current with your vaccines. Tell your health care  provider if: You often feel depressed. You have ever been abused or do not feel safe at home. Summary Adopting a healthy lifestyle and getting preventive care are important in promoting health and wellness. Follow your health care provider's instructions about healthy diet, exercising, and getting tested or screened for diseases. Follow your health care provider's instructions on monitoring your cholesterol and blood pressure. This information is not intended to replace advice given to you by your health care provider. Make sure you discuss any questions you have with your health care provider. Document Revised: 11/20/2020 Document Reviewed: 09/05/2018 Elsevier Patient Education  2022 [employer]vier Inc.  

## 2021-06-12 LAB — IRON,TIBC AND FERRITIN PANEL
%SAT: 80 % (calc) — ABNORMAL HIGH (ref 16–45)
Ferritin: 70 ng/mL (ref 16–288)
Iron: 218 ug/dL — ABNORMAL HIGH (ref 45–160)
TIBC: 274 mcg/dL (calc) (ref 250–450)

## 2021-06-12 LAB — T3: T3, Total: 135 ng/dL (ref 76–181)

## 2021-06-14 ENCOUNTER — Other Ambulatory Visit: Payer: Self-pay | Admitting: Family Medicine

## 2021-06-14 ENCOUNTER — Ambulatory Visit
Admission: RE | Admit: 2021-06-14 | Discharge: 2021-06-14 | Disposition: A | Discharge status: Home / Self Care | Payer: 59 | Source: Ambulatory Visit | Attending: Family Medicine | Admitting: Family Medicine

## 2021-06-14 ENCOUNTER — Other Ambulatory Visit: Payer: Self-pay

## 2021-06-14 DIAGNOSIS — N6310 Unspecified lump in the right breast, unspecified quadrant: Secondary | ICD-10-CM

## 2021-06-15 ENCOUNTER — Encounter: Payer: Self-pay | Admitting: Gastroenterology

## 2021-06-15 ENCOUNTER — Telehealth: Payer: Self-pay | Admitting: Family Medicine

## 2021-06-15 NOTE — Telephone Encounter (Signed)
Please call patient Liver, kidney and thyroid function are normal Blood cell counts and electrolytes are normal Diabetes screening/A1c is normal  Cholesterol panel is high with the LDL of 155.  I would encourage her to make sure she is taking the statin medication. Vitamin D is most normal at 29.8.  I would encourage her to add 1000 units of vitamin D to her supplement daily.  Her iron levels are too high.  I would encourage her to stop iron supplementation altogether.  We will recheck these levels again on her follow-up appointment to ensure they are maintaining normal range.

## 2021-06-15 NOTE — Telephone Encounter (Signed)
Spoke with pt regarding labs and instructions. Pt states she is taking statin hs.

## 2021-06-16 ENCOUNTER — Telehealth: Payer: Self-pay

## 2021-06-16 DIAGNOSIS — Z9689 Presence of other specified functional implants: Secondary | ICD-10-CM

## 2021-06-16 NOTE — Telephone Encounter (Signed)
Hey Christy glad to have you back.  Have you talked to this lady about the [place] colon?

## 2021-06-16 NOTE — Telephone Encounter (Signed)
Mansouraty, Telford Nab., MD  Lavena Bullion, DO; Keane Police, RN Christy,  Please move forward with scheduling patient for direct colonoscopy.  1 thing we need to define and ensure is whether the patient is able to transfer, I believe she can, but if she cannot she will have to be set up for a hospital-based colonoscopy with me (this is due to her prior stroke).  Also, the patient does not look like she ever ended up getting a KUB to evaluate the pancreas duct stent that was placed at [facility].  She needs to have a 2 view KUB performed to ensure the pancreas duct stent fell out over the course of the last year.  Please let me know once she has been scheduled for the colonoscopy.  Thanks.  GM        Previous Messages

## 2021-06-16 NOTE — Telephone Encounter (Signed)
Delorise Jackson,  Thank you!  Seems as if I am relearning Epic over.  Yes I spoke with her and she is scheduled for an office visit on 07/14/2021 with [name] Landmark. Patient is aware of appointment

## 2021-06-17 ENCOUNTER — Other Ambulatory Visit: Payer: Self-pay

## 2021-06-17 DIAGNOSIS — I6523 Occlusion and stenosis of bilateral carotid arteries: Secondary | ICD-10-CM

## 2021-06-17 NOTE — Telephone Encounter (Signed)
I have spoken to the pt and she will come in tomorrow for KUB.  The order has been entered. See note from [place] the pt has also been set up for appt

## 2021-06-18 ENCOUNTER — Ambulatory Visit (HOSPITAL_COMMUNITY)
Admission: RE | Admit: 2021-06-18 | Discharge: 2021-06-18 | Disposition: A | Discharge status: Home / Self Care | Payer: 59 | Source: Ambulatory Visit | Attending: Gastroenterology | Admitting: Gastroenterology

## 2021-06-18 ENCOUNTER — Other Ambulatory Visit: Payer: Self-pay | Admitting: Diagnostic Radiology

## 2021-06-18 ENCOUNTER — Ambulatory Visit
Admission: RE | Admit: 2021-06-18 | Discharge: 2021-06-18 | Disposition: A | Discharge status: Home / Self Care | Payer: 59 | Source: Ambulatory Visit | Attending: Family Medicine | Admitting: Family Medicine

## 2021-06-18 ENCOUNTER — Other Ambulatory Visit: Payer: Self-pay

## 2021-06-18 DIAGNOSIS — N6310 Unspecified lump in the right breast, unspecified quadrant: Secondary | ICD-10-CM

## 2021-06-18 DIAGNOSIS — Z9689 Presence of other specified functional implants: Secondary | ICD-10-CM | POA: Diagnosis not present

## 2021-06-24 ENCOUNTER — Encounter: Payer: 59 | Admitting: Physical Medicine & Rehabilitation

## 2021-07-02 ENCOUNTER — Ambulatory Visit (HOSPITAL_COMMUNITY)
Admission: RE | Admit: 2021-07-02 | Discharge: 2021-07-02 | Disposition: A | Discharge status: Home / Self Care | Payer: 59 | Source: Ambulatory Visit | Attending: Vascular Surgery | Admitting: Vascular Surgery

## 2021-07-02 ENCOUNTER — Other Ambulatory Visit: Payer: Self-pay

## 2021-07-02 ENCOUNTER — Ambulatory Visit (INDEPENDENT_AMBULATORY_CARE_PROVIDER_SITE_OTHER): Payer: 59 | Admitting: Physician Assistant

## 2021-07-02 VITALS — BP 141/77 | HR 85 | Temp 97.8°F | Resp 16 | Ht 60.0 in | Wt 119.0 lb

## 2021-07-02 DIAGNOSIS — I6523 Occlusion and stenosis of bilateral carotid arteries: Secondary | ICD-10-CM

## 2021-07-02 NOTE — Progress Notes (Signed)
Carotid Artery Follow-Up   VASCULAR SURGERY ASSESSMENT & PLAN:   [name] is a 60 y.o. female seen today for annual surveillance of carotid artery stenosis.  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 At that visit, she was continuing to make progress with rehabilitation and had no new neurologic deficit.  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  Bilateral carotid artery stenosis: The patient has no symptoms referable to carotid artery stenosis.  Duplex examination today reveals increase in left internal carotid artery stenosis estimated to 60 to 79% as compared to 1 year ago.  We reviewed the signs and symptoms of stroke/TIA and advised the patient to call EMS should these occur.   Continue optimal medical management of hypertension and follow-up with primary care physician. Encouraged complete smoking cessation. Continue the following medications: Statin, Xarelto Follow-up in 6 months with carotid duplex ultrasound.  SUBJECTIVE:   The patient denies monocular blindness, slurred speech, facial drooping, or numbness.  She has residual right upper and lower extremity weakness that is unchanged.  PHYSICAL EXAM:   Vitals:   07/02/21 1134  BP: (!) 141/77  Pulse: 85  Resp: 16  Temp: 97.8 F (36.6 C)  TempSrc: Temporal  SpO2: 97%  Weight: 119 lb (54 kg)  Height: 5' (1.524 m)    General appearance: Well-developed, well-nourished in no apparent distress Neurologic: Alert and oriented x4, tongue is midline, face symmetric, speech fluent, 5/5 left, 3/5 right upper extremity grip strength, triceps and biceps strength Cardiovascular: Heart rate and rhythm are regular.   No carotid bruits. Respirations: Nonlabored, CTA B Abdomen: No palpable pulsatile mass   NON-INVASIVE VASCULAR STUDIES  07/02/2021 Summary:  Right Carotid: Velocities in the right ICA are consistent with a 1-39%   stenosis.   Left Carotid: Velocities in the left ICA are consistent with a 60-79%  stenosis.   Vertebrals:  Right vertebral artery demonstrates antegrade flow. Left  vertebral artery demonstrates retrograde flow.   Subclavians: Normal flow hemodynamics were seen in bilateral subclavian arteries.   *See table(s) above for measurements and observations.       Preliminary    Carotid Duplex on 04/09/2020: Right:  40-59% ICA stenosis Left:  40-59% ICA stenosis PROBLEM LIST:    The patient's past medical history, past surgical history, family history, social history, allergy list and medication list are reviewed.   CURRENT MEDS:    Current Outpatient Medications:    baclofen (LIORESAL) 10 MG tablet, Take 1 tablet (10 mg total) by mouth 3 (three) times daily., Disp: 90 tablet, Rfl: 5   calcium carbonate (TUMS - DOSED IN MG ELEMENTAL CALCIUM) 500 MG chewable tablet, Chew 1 tablet by mouth 2 (two) times daily as needed for indigestion or heartburn. , Disp: , Rfl:    carvedilol (COREG) 3.125 MG tablet, Take 1 tablet (3.125 mg total) by mouth in the morning and at bedtime., Disp: 180 tablet, Rfl: 1   cholecalciferol (VITAMIN D) 1000 units tablet, Take 1,000 Units by mouth daily., Disp: , Rfl:    diclofenac Sodium (VOLTAREN) 1 % GEL, APPLY 2 GRAMS TOPICALLY 4 TIMES A DAY, Disp: 300 g, Rfl: 2   hydrOXYzine (ATARAX/VISTARIL) 10 MG tablet, Take 1 tablet (10 mg total) by mouth at bedtime., Disp: 90 tablet, Rfl: 1   levocetirizine (XYZAL) 5 MG tablet, Take 1 tablet (5 mg total) by mouth every evening., Disp: 90 tablet, Rfl: 3  pantoprazole (PROTONIX) 40 MG tablet, Take 1 tablet (40 mg total) by mouth daily., Disp: 90 tablet, Rfl: 1   pravastatin (PRAVACHOL) 20 MG tablet, Take 1 tablet (20 mg total) by mouth every evening., Disp: 90 tablet, Rfl: 3   rivaroxaban (XARELTO) 20 MG TABS tablet, TAKE 1 TABLET BY MOUTH ONCE DAILY WITH EVENING MEAL, Disp: 30 tablet, Rfl: 5   selenium 200 MCG TABS tablet,  Take by mouth., Disp: , Rfl:    SENNA PO, Take by mouth., Disp: , Rfl:    traMADol (ULTRAM) 50 MG tablet, Take 1-2 tablets (50-100 mg total) by mouth 2 (two) times daily as needed., Disp: 120 tablet, Rfl: 5   triamcinolone cream (KENALOG) 0.1 %, Apply topically 4 (four) times daily., Disp: 80 g, Rfl: 5   vitamin B-12 (CYANOCOBALAMIN) 100 MCG tablet, Take 100 mcg by mouth daily., Disp: , Rfl:    REVIEW OF SYSTEMS:   [X]  denotes positive finding, [ ]  denotes negative finding Cardiac  Comments:  Chest pain or chest pressure:    Shortness of breath upon exertion:    Short of breath when lying flat:    Irregular heart rhythm:        Vascular    Pain in calf, thigh, or hip brought on by ambulation:    Pain in feet at night that wakes you up from your sleep:     Blood clot in your veins:    Leg swelling:         Pulmonary    Oxygen at home:    Productive cough:     Wheezing:         Neurologic    Sudden weakness in arms or legs:     Sudden numbness in arms or legs:     Sudden onset of difficulty speaking or slurred speech:    Temporary loss of vision in one eye:     Problems with dizziness:         Gastrointestinal    Blood in stool:     Vomited blood:         Genitourinary    Burning when urinating:     Blood in urine:        Psychiatric    Major depression:         Hematologic    Bleeding problems:    Problems with blood clotting too easily:        Skin    Rashes or ulcers:        Constitutional    Fever or chills:     Barbie Banner, PA-C  Office: [phone] 07/02/2021

## 2021-07-05 ENCOUNTER — Other Ambulatory Visit: Payer: Self-pay

## 2021-07-05 DIAGNOSIS — I6523 Occlusion and stenosis of bilateral carotid arteries: Secondary | ICD-10-CM

## 2021-07-14 ENCOUNTER — Telehealth: Payer: Self-pay

## 2021-07-14 ENCOUNTER — Encounter: Payer: Self-pay | Admitting: Gastroenterology

## 2021-07-14 ENCOUNTER — Ambulatory Visit (INDEPENDENT_AMBULATORY_CARE_PROVIDER_SITE_OTHER): Payer: 59 | Admitting: Gastroenterology

## 2021-07-14 VITALS — BP 140/60 | HR 83 | Ht 60.0 in | Wt 121.0 lb

## 2021-07-14 DIAGNOSIS — Z1211 Encounter for screening for malignant neoplasm of colon: Secondary | ICD-10-CM

## 2021-07-14 DIAGNOSIS — K219 Gastro-esophageal reflux disease without esophagitis: Secondary | ICD-10-CM

## 2021-07-14 DIAGNOSIS — K834 Spasm of sphincter of Oddi: Secondary | ICD-10-CM | POA: Diagnosis not present

## 2021-07-14 DIAGNOSIS — K859 Acute pancreatitis without necrosis or infection, unspecified: Secondary | ICD-10-CM

## 2021-07-14 DIAGNOSIS — Z8719 Personal history of other diseases of the digestive system: Secondary | ICD-10-CM

## 2021-07-14 DIAGNOSIS — K862 Cyst of pancreas: Secondary | ICD-10-CM

## 2021-07-14 MED ORDER — NA SULFATE-K SULFATE-MG SULF 17.5-3.13-1.6 GM/177ML PO SOLN: 1.0000 | ORAL | 0 refills | Status: DC

## 2021-07-14 NOTE — Telephone Encounter (Addendum)
Patient with diagnosis of Afib on Xarelto for anticoagulation.    Procedure: Colonoscopy Date of procedure: 07/28/21  CHA2DS2-VASc Score = 6  This indicates a 9.7% annual risk of stroke. The patient's score is based upon: CHF History: 1 HTN History: 1 Diabetes History: 0 Stroke History: 2 Vascular Disease History: 1 Age Score: 0 Gender Score: 1   CrCl 57 mL/min Platelet count 328 K  Per office protocol, patient can hold Xarelto for 1 day prior to procedure. Resume as soon as safely possible after due to elevated CV risk.

## 2021-07-14 NOTE — Patient Instructions (Signed)
We have sent the following medications to your pharmacy for you to pick up at your convenience: [place] have been scheduled for a colonoscopy. Please follow written instructions given to you at your visit today.  Please pick up your prep supplies at the pharmacy within the next 1-3 days. If you use inhalers (even only as needed), please bring them with you on the day of your procedure.  You will be contaced by our office prior to your procedure for directions on holding your Xarelto.  If you do not hear from our office 1 week prior to your scheduled procedure, please call [phone] to discuss.  If you are age 61 or younger, your body mass index should be between 19-25. Your Body mass index is 23.63 kg/m. If this is out of the aformentioned range listed, please consider follow up with your Primary Care Provider.   ________________________________________________________  The [facility] providers would like to encourage you to use [facility] to communicate with providers for non-urgent requests or questions.  Due to long hold times on the telephone, sending your provider a message by [facility] may be a faster and more efficient way to get a response.  Please allow 48 business hours for a response.  Please remember that this is for non-urgent requests.   Thank you for choosing me and [facility].  [name] Landmark

## 2021-07-14 NOTE — Telephone Encounter (Signed)
Request for surgical clearance:     Endoscopy Procedure  What type of surgery is being performed?     Colonoscopy  When is this surgery scheduled?     07/28/21  What type of clearance is required ?   Pharmacy  Are there any medications that need to be held prior to surgery and how long? Xarelto   Practice name and name of physician performing surgery?      [facility]-Dr Mansouraty   What is your office phone and fax number?      Phone- [phone]  [phone]  Anesthesia type (None, local, MAC, general) ?       MAC

## 2021-07-15 ENCOUNTER — Encounter: Payer: Self-pay | Admitting: Gastroenterology

## 2021-07-15 DIAGNOSIS — K834 Spasm of sphincter of Oddi: Secondary | ICD-10-CM

## 2021-07-15 DIAGNOSIS — K8689 Other specified diseases of pancreas: Secondary | ICD-10-CM | POA: Insufficient documentation

## 2021-07-15 DIAGNOSIS — Z1211 Encounter for screening for malignant neoplasm of colon: Secondary | ICD-10-CM | POA: Insufficient documentation

## 2021-07-15 HISTORY — DX: Other specified diseases of pancreas: K86.89

## 2021-07-15 HISTORY — DX: Spasm of sphincter of Oddi: K83.4

## 2021-07-15 NOTE — Telephone Encounter (Signed)
   Primary Cardiologist: [name], MD  Clinical pharmacists have reviewed the chart for [facility].  Clinical pharmacist have made the following recommendations based off of her current and past medical history.  Patient with diagnosis of Afib on Xarelto for anticoagulation.     Procedure: Colonoscopy Date of procedure: 07/28/21   CHA2DS2-VASc Score = 6  This indicates a 9.7% annual risk of stroke. The patient's score is based upon: CHF History: 1 HTN History: 1 Diabetes History: 0 Stroke History: 2 Vascular Disease History: 1 Age Score: 0 Gender Score: 1    CrCl 57 mL/min Platelet count 328 K   Per office protocol, patient can hold Xarelto for 1 day prior to procedure. Resume as soon as safely possible after due to elevated CV risk.  I will route this recommendation to the requesting party via Epic fax function and remove from pre-op pool.  Please call with questions.  Jossie Ng. Kateena Degroote NP-C    07/15/2021, 7:35 AM [place] [address] Office [phone] Fax [phone]

## 2021-07-15 NOTE — Progress Notes (Signed)
[place] VISIT   Primary Care Provider [name], [place] [address] [place] [address] [phone]  Patient Profile: [name] is a 60 y.o. female with a pmh significant for status post CVA, atrial fibrillation (on anticoagulation), CAD status post CABG, ischemic cardiomyopathy, recurrent idiopathic acute pancreatitis (status post ERCP with CBD and PD sphincterotomies for equivocal SOD findings on manometry), status post cholecystectomy, pancreatic cyst, GERD.  The patient presents to the [facility] for an evaluation and management of problem(s) noted below:  Problem List 1. Colon cancer screening   2. Idiopathic recurrent acute pancreatitis   3. Sphincter of Oddi dysfunction   4. Gastroesophageal reflux disease, unspecified whether esophagitis present   5. Pancreatic cyst     History of Present Illness Please see prior notes for full details of HPI.  Interval History Today, and the patient returns to the GI clinic of [place] after not being seen for almost a year.  She previously saw Dr. Bryan Lemma but has decided to transition to the [facility] office and wanted to follow-up with me.  The patient's history is outlined in her chart was significant for multiple episodes of acute recurrent pancreatitis and a smoldering pancreatitis.  Eventually when functional disorder of the sphincter was concerned and we ended up having her be evaluated at [facility] advanced endoscopy clinic where in which she underwent high risk ERCP with manometry.  She had equivocal findings for sphincter of Oddi and in the setting of her clinical history and dilated bile ducts and elevated pancreatic enzymes underwent both CBD and PD sphincterotomies.  She had a pancreatic stent placed for prophylaxis and then did not follow-up with [facility].  When she decided to follow-up with [place] here we had her obtain a KUB and the pancreas stent is no longer present.  The patient  is significantly better than when I had last seen her over a year ago.  She has not had any further episodes of pancreatitis.  Her issues of anorexia and decreased appetite nausea and vomiting as well as heartburn symptoms significantly have improved since she has not had episodes of pancreatitis.  She is actually been gaining weight over the course of the last year.  Her bowel movements are normal on a daily basis.  She has not noted any rectal bleeding.  She has never had colon cancer screening and this was recommended by her PCP and she wanted to have evaluation for that.  Over the course of the last year she has also improved significantly in regards to improvements after her stroke.  She remains on blood thinners.   GI Review of Systems Positive as above Negative for dysphagia, odynophagia, pyrosis, alteration of bowel habits, pain, melena, hematochezia   Review of Systems General: Denies fevers/chills/unintentional weight loss Cardiovascular: Denies chest pain/palpitations Pulmonary: Denies shortness of breath Gastroenterological: See HPI Genitourinary: Denies darkened urine Hematological: Positive for easy bruising/bleeding due to anticoagulation Dermatological: Denies jaundice Psychological: Mood is stable   Medications Current Outpatient Medications  Medication Sig Dispense Refill   baclofen (LIORESAL) 10 MG tablet Take 1 tablet (10 mg total) by mouth 3 (three) times daily. 90 tablet 5   calcium carbonate (TUMS - DOSED IN MG ELEMENTAL CALCIUM) 500 MG chewable tablet Chew 1 tablet by mouth 2 (two) times daily as needed for indigestion or heartburn.      carvedilol (COREG) 3.125 MG tablet Take 1 tablet (3.125 mg total) by mouth in the morning and at bedtime. [place]  tablet 1   cholecalciferol (VITAMIN D) 1000 units tablet Take 1,000 Units by mouth daily.     diclofenac Sodium (VOLTAREN) 1 % GEL APPLY 2 GRAMS TOPICALLY 4 TIMES A DAY 300 g 2   hydrOXYzine (ATARAX/VISTARIL) 10 MG tablet Take  1 tablet (10 mg total) by mouth at bedtime. 90 tablet 1   levocetirizine (XYZAL) 5 MG tablet Take 1 tablet (5 mg total) by mouth every evening. 90 tablet 3   Na Sulfate-K Sulfate-Mg Sulf (SUPREP BOWEL PREP KIT) 17.5-3.13-1.6 GM/177ML SOLN Take 1 kit by mouth as directed. For colonoscopy prep 354 mL 0   pantoprazole (PROTONIX) 40 MG tablet Take 1 tablet (40 mg total) by mouth daily. 90 tablet 1   pravastatin (PRAVACHOL) 20 MG tablet Take 1 tablet (20 mg total) by mouth every evening. 90 tablet 3   rivaroxaban (XARELTO) 20 MG TABS tablet TAKE 1 TABLET BY MOUTH ONCE DAILY WITH EVENING MEAL 30 tablet 5   selenium 200 MCG TABS tablet Take by mouth.     SENNA PO Take by mouth.     traMADol (ULTRAM) 50 MG tablet Take 1-2 tablets (50-100 mg total) by mouth 2 (two) times daily as needed. 120 tablet 5   triamcinolone cream (KENALOG) 0.1 % Apply topically 4 (four) times daily. 80 g 5   vitamin B-12 (CYANOCOBALAMIN) 100 MCG tablet Take 100 mcg by mouth daily.     No current facility-administered medications for this visit.    Allergies Allergies  Allergen Reactions   Hydromorphone Other (See Comments)    Hallucinations Other reaction(s): Hallucination   Lopressor [Metoprolol Tartrate] Swelling    Angioedema   Sacubitril-Valsartan Swelling and Rash    Welts, possible angioedema  Rash   Allegra Allergy [Fexofenadine Hcl] Hives   Fexofenadine Hives   Nsaids Other (See Comments)    On Xarelto Not taking d/t abdominal pain   Gabapentin Rash   Gabapentin (Once-Daily) Rash   Lipitor [Atorvastatin] Diarrhea    Histories Past Medical History:  Diagnosis Date   AAA (abdominal aortic aneurysm)    a. 3.3cm infrarenal by CT 02/2020.   Abnormal findings on endoscopic ultrasound (EUS) 01/25/2020   Abnormality present on pathology (Atypical Pathology) 01/25/2020   Angio-edema    Angioedema    Arthritis    CAD (coronary artery disease)    a. s/p NSTEMI in 10/2017 with cath showing LM disease --> s/p  CABG on 11/23/2017 with LIMA-LAD and SVG-OM.    Carotid stenosis    a. Duplex 11/2018 8-46% RICA, 96-29% LICA.   Closed nondisplaced fracture of greater trochanter of right femur ([place]) 04/09/2020   Cystocele, unspecified  03/03/2020   Gallbladder sludge and cholelithiasis without cholecystitis 01/25/2020   Small stones of the gallbladder appreciated on CT 02/2020 as well.   Gastritis and duodenitis 12/06/2019   11/2019 Peptic duodenitis  -  No dysplasia or malignancy identified  B. STOMACH, BIOPSY:  -  Mild chronic gastritis with intestinal metaplasia    Hay fever    History of kidney stones    Idiopathic recurrent acute pancreatitis 01/25/2020   Ischemic cardiomyopathy    a. EF 35-40% by echo in 10/2017. b. 55% by echo 01/2020.   Kidney stone 02/18/2020   Lytic bone lesion of femur    NSTEMI (non-ST elevated myocardial infarction) (HCC)    PAF (paroxysmal atrial fibrillation) ([place])    Diagnosed by ILR   Pancreatitis    Pancreatitis, recurrent 11/07/2019   Pilonidal cyst 10/21/2019   Pneumonia 11/16/2017  Stroke ([place]) 08/23/2017   Left MCA infarct status post TPA and thrombectomy   Urticaria    Past Surgical History:  Procedure Laterality Date   BIOPSY  12/02/2019   Procedure: BIOPSY;  Surgeon: Rush Landmark Telford Nab., MD;  Location: [facility];  Service: Gastroenterology;;   CHOLECYSTECTOMY N/A 04/30/2020   Procedure: LAPAROSCOPIC CHOLECYSTECTOMY WITH INTRAOPERATIVE CHOLANGIOGRAM;  Surgeon: Armandina Gemma, MD;  Location: [facility];  Service: General;  Laterality: N/A;   CORONARY ARTERY BYPASS GRAFT N/A 11/23/2017   Procedure: CORONARY ARTERY BYPASS GRAFTING (CABG) x 2, ON PUMP, USING LEFT INTERNAL MAMMARY ARTERY AND RIGHT GREATER SAPHENOUS VEIN HARVESTED ENDOSCOPICALLY;  Surgeon: Gaye Pollack, MD;  Location: [facility];  Service: Open Heart Surgery;  Laterality: N/A;   CRYOABLATION     cervical   ESOPHAGOGASTRODUODENOSCOPY (EGD) WITH PROPOFOL N/A 12/02/2019   Procedure: ESOPHAGOGASTRODUODENOSCOPY (EGD)  WITH PROPOFOL;  Surgeon: Rush Landmark Telford Nab., MD;  Location: [facility];  Service: Gastroenterology;  Laterality: N/A;   ESOPHAGOGASTRODUODENOSCOPY (EGD) WITH PROPOFOL N/A 03/19/2020   Procedure: ESOPHAGOGASTRODUODENOSCOPY (EGD) WITH PROPOFOL;  Surgeon: Milus Banister, MD;  Location: [facility];  Service: Endoscopy;  Laterality: N/A;   EUS N/A 03/19/2020   Procedure: UPPER ENDOSCOPIC ULTRASOUND (EUS) RADIAL;  Surgeon: Milus Banister, MD;  Location: [facility];  Service: Endoscopy;  Laterality: N/A;   EUS N/A 03/19/2020   Procedure: UPPER ENDOSCOPIC ULTRASOUND (EUS) LINEAR;  Surgeon: Milus Banister, MD;  Location: [facility];  Service: Endoscopy;  Laterality: N/A;   FINE NEEDLE ASPIRATION  12/02/2019   Procedure: FINE NEEDLE ASPIRATION (FNA) LINEAR;  Surgeon: Irving Copas., MD;  Location: [place];  Service: Gastroenterology;;   FINE NEEDLE ASPIRATION N/A 03/19/2020   Procedure: FINE NEEDLE ASPIRATION (FNA) LINEAR;  Surgeon: Milus Banister, MD;  [facility];  Service: Endoscopy;  Laterality: N/A;   IR ANGIO INTRA EXTRACRAN SEL COM CAROTID INNOMINATE UNI R MOD SED  08/21/2017   IR ANGIO VERTEBRAL SEL SUBCLAVIAN INNOMINATE UNI R MOD SED  08/21/2017   IR PERCUTANEOUS ART THROMBECTOMY/INFUSION INTRACRANIAL INC DIAG ANGIO  08/21/2017   IR RADIOLOGIST EVAL & MGMT  10/30/2017   LOOP RECORDER INSERTION N/A 08/28/2017   Procedure: LOOP RECORDER INSERTION;  Surgeon: Constance Haw, MD;  Location: [place] [facility];  Service: Cardiovascular;  Laterality: N/A;   RADIOLOGY WITH ANESTHESIA N/A 08/21/2017   Procedure: RADIOLOGY WITH ANESTHESIA;  Surgeon: Luanne Bras, MD;  Location: [place];  Service: Radiology;  Laterality: N/A;   RIGHT/LEFT HEART CATH AND CORONARY ANGIOGRAPHY N/A 11/20/2017   Procedure: RIGHT/LEFT HEART CATH AND CORONARY ANGIOGRAPHY;  Surgeon: Jettie Booze, MD;  Location: [place] [facility];  Service: Cardiovascular;  Laterality: N/A;    TEE WITHOUT CARDIOVERSION N/A 08/28/2017   Procedure: TRANSESOPHAGEAL ECHOCARDIOGRAM (TEE);  Surgeon: Fay Records, MD;  Location: [facility];  Service: Cardiovascular;  Laterality: N/A;   TEE WITHOUT CARDIOVERSION N/A 11/23/2017   Procedure: TRANSESOPHAGEAL ECHOCARDIOGRAM (TEE);  Surgeon: Gaye Pollack, MD;  Location: [facility];  Service: Open Heart Surgery;  Laterality: N/A;   TUBAL LIGATION     UPPER ESOPHAGEAL ENDOSCOPIC ULTRASOUND (EUS) N/A 12/02/2019   Procedure: UPPER ESOPHAGEAL ENDOSCOPIC ULTRASOUND (EUS);  Surgeon: Irving Copas., MD;  Location: [place] [facility];  Service: Gastroenterology;  Laterality: N/A;   Social History   Socioeconomic History   Marital status: Married    Spouse name: Not on file   Number of children: 2   Years of education: Not on file   Highest education  level: Not on file  Occupational History   Not on file  Tobacco Use   Smoking status: Some Days    Packs/day: 0.10    Years: 37.00    Pack years: 3.70    Types: Cigarettes   Smokeless tobacco: Never  Vaping Use   Vaping Use: Never used  Substance and Sexual Activity   Alcohol use: No   Drug use: No   Sexual activity: Yes    Partners: Male  Other Topics Concern   Not on file  Social History Narrative   Married. 2 children.    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    Walks with cane or wheelchair.    Former smoker.    Drinks caffeine.    Smoke alarm in the home.   Firearms in the home.    Wears her seatbelt.    Feels safe In her relationships.    Social Determinants of Radio broadcast assistant Strain: Not on file  Food Insecurity: Not on file  Transportation Needs: Not on file  Physical Activity: Not on file  Stress: Not on file  Social Connections: Not on file  Intimate Partner Violence: Not on file   Family History  Problem Relation Age of Onset   Hypertension Mother    Hyperlipidemia Mother    Diabetes type II Mother    Urticaria Mother    Heart attack Father     Hypertension Brother    Breast cancer Cousin 48   Immunodeficiency Neg Hx    Eczema Neg Hx    Atopy Neg Hx    Asthma Neg Hx    Angioedema Neg Hx    Allergic rhinitis Neg Hx    Colon cancer Neg Hx    Esophageal cancer Neg Hx    Pancreatic cancer Neg Hx    Colon polyps Neg Hx    Esophageal varices Neg Hx    Inflammatory bowel disease Neg Hx    Rectal cancer Neg Hx    I have reviewed her medical, social, and family history in detail and updated the electronic medical record as necessary.    PHYSICAL EXAMINATION  BP 140/60   Pulse 83   Ht 5' (1.524 m)   Wt 121 lb (54.9 kg)   LMP  (LMP Unknown) Comment: at age 27  SpO2 94%   BMI 23.63 kg/m  Wt Readings from Last 3 Encounters:  07/14/21 121 lb (54.9 kg)  07/02/21 119 lb (54 kg)  06/11/21 121 lb (54.9 kg)  GEN: NAD, appears stated age, doesn't appear chronically ill PSYCH: Cooperative, without pressured speech EYE: Conjunctivae pink, sclerae anicteric ENT: MMM CV: Nontachycardic RESP: No audible wheezing GI: NABS, soft, NT/ND, without rebound or guarding MSK/EXT: No significant lower extremity edema noted SKIN: No jaundice NEURO:  Alert & Oriented x 3, no focal deficits   REVIEW OF DATA  I reviewed the following data at the time of this encounter:  GI Procedures and Studies  November 2021 outside ERCP Impression: - The major papilla appeared normal. - Findings on manometry were equivocal for pancreatic  sphincter of Oddi dysfunction. Based upon findings,  and in consideration of recurrent acute pancreatitis  of unclear etiology, dual biliary and pancreatic  sphincterotomies were performed. - Mild irregularity of the pancreatic duct side  branches. Otherwise, normal pancreatogram. One  prophylactic stent was placed into the main pancreatic  duct. - Marginal bile duct dilation was seen on  cholangiogram, without obvious intraductal filling  defects. Likely physiological dilation following  cholecystectomy.  Laboratory Studies  Reviewed those in epic and care everywhere  Imaging Studies  September 2022 KUB IMPRESSION: No pancreatic stent visualized. Unremarkable bowel gas pattern.  October 2021 MRI/MRCP IMPRESSION: 1. There is a nonenhancing, slightly T1 and T2 hypointense lesion of the central pancreatic head, measuring 1.9 x 1.8 cm. As on previous examination, this is slightly decreased in size in comparison to examination dated 03/19/2020 and imaging features as well as overall course are highly suggestive of pancreatic pseudocyst, perhaps with hemorrhagic/proteinaceous contents. No enhancing mass, acute inflammatory changes or other parenchymal abnormality identified. 2. Status post cholecystectomy. No biliary ductal dilatation. 3. 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 Recommend follow-up ultrasound every 3 years. This recommendation follows ACR consensus guidelines: White Paper of the ACR Incidental Findings Committee II on Vascular Findings. J Am Coll Radiol 2013; 10:789-794.   ASSESSMENT  [name] is a 60 y.o. female with a pmh significant for status post CVA, atrial fibrillation (on anticoagulation), CAD status post CABG, ischemic cardiomyopathy, recurrent idiopathic acute pancreatitis (status post ERCP with CBD and PD sphincterotomies for equivocal SOD findings on manometry), status postcholecystectomy, pancreatic cyst, GERD.  The patient is seen today for evaluation and management of:  1. Colon cancer screening   2. Idiopathic recurrent acute pancreatitis   3. Sphincter of Oddi dysfunction   4. Gastroesophageal reflux disease, unspecified whether esophagitis present   5. Pancreatic cyst    The patient is hemodynamically and clinically stable.  I am very surprised and happy that the patient has done this significantly well after her biliary and pancreatic sphincterotomies.  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  She is aware that she could have episodes years in the future if this enterotomies close down at which point we would go ahead and stretch those open if necessary.  Hopefully this will not be the case but she will be on the look out for that.  She does have evidence of a previous pancreas cyst versus necrosis that was noted last year and has not been followed up with imaging since she is done so well.  We will likely recommend in 2023, as long as the patient is still doing well to obtain cross-sectional imaging with a CT abdomen pancreas protocol or MRI/MRCP to ensure that the pancreas cyst has resolved or for monitoring purposes.  The patient is due for colon cancer screening and we will proceed with a colonoscopy after obtaining ability to come off anticoagulation safely.  She is going to maintain her current PPI dosing for now but if she continues to do well she will decrease her dose to 20 mg.  The risks and benefits of endoscopic evaluation were discussed with the patient; these include but are not limited to the risk of perforation, infection, bleeding, missed lesions, lack of diagnosis, severe illness requiring hospitalization, as well as anesthesia and sedation related illnesses.  The patient and/or family is agreeable to proceed.  All patient questions were answered to the best of my ability, and the patient agrees to the aforementioned plan of action with follow-up as indicated.   PLAN  Proceed with scheduling colonoscopy for screening purposes Maintain PPI 40 mg daily for now but may decrease to 20 mg daily if patient is doing well in future Cross-sectional imaging in 2023 (pancreas protocol CT abdomen versus MRI/MRCP  abdomen) to follow-up prior pancreas cyst Heart healthy diet to be maintained   Orders Placed This Encounter  Procedures    Ambulatory referral to Gastroenterology    New Prescriptions   NA SULFATE-K SULFATE-MG SULF (SUPREP BOWEL PREP KIT) 17.5-3.13-1.6 GM/177ML SOLN    Take 1 kit by mouth as directed. For colonoscopy prep   Modified Medications   No medications on file    Planned Follow Up No follow-ups on file.   Total Time in Face-to-Face and in Coordination of Care for patient including independent/personal interpretation/review of prior testing, medical history, examination, medication adjustment, communicating results with the patient directly, and documentation with the EHR is 25 minutes.   Justice Britain, MD [facility] Office # [phone]

## 2021-07-16 ENCOUNTER — Encounter: Payer: Self-pay | Admitting: Gastroenterology

## 2021-07-16 NOTE — Telephone Encounter (Signed)
Pt has been informed to hold Xarelto 1 day prior to procedure. Pt voiced understanding.

## 2021-07-16 NOTE — Telephone Encounter (Signed)
Spoke with patient this afternoon.Prep will be changed to Plenvu. Pt will come by office Monday or Tuesday and pick up prep-sample and new instructions. Pt also advised to hold Xarelto 1 days prior to procedure. Pt voiced understanding.

## 2021-07-16 NOTE — Telephone Encounter (Signed)
Pt called stating prep for colon is too expensive.  Is there another prep she can take?  Please call and advise.

## 2021-07-23 ENCOUNTER — Telehealth: Payer: Self-pay | Admitting: Gastroenterology

## 2021-07-23 NOTE — Telephone Encounter (Signed)
Pt stopped by at the office to request prescription for Pantoprazole 20 mg. She stated that [name] Landmark wanted her to wean off that medication. Her PCP prescribed it for 40mg  and directed pt to [name] Landmark to take over for prescription. Any questions pls call pt. Her pharmacy is [facility] in [place].

## 2021-07-26 MED ORDER — PANTOPRAZOLE SODIUM 20 MG PO TBEC: 20.0000 mg | DELAYED_RELEASE_TABLET | Freq: Every day | ORAL | 0 refills | Status: DC

## 2021-07-26 NOTE — Telephone Encounter (Signed)
Reviewed patient's chart and read last note from [name] Landmark. 20 mg of Pantoprazole has been sent to pharmacy.

## 2021-07-28 ENCOUNTER — Encounter: Payer: Self-pay | Admitting: Gastroenterology

## 2021-07-28 ENCOUNTER — Ambulatory Visit (AMBULATORY_SURGERY_CENTER): Payer: 59 | Admitting: Gastroenterology

## 2021-07-28 VITALS — BP 132/59 | HR 88 | Temp 97.5°F | Resp 22 | Ht 60.0 in | Wt 121.0 lb

## 2021-07-28 DIAGNOSIS — Z1211 Encounter for screening for malignant neoplasm of colon: Secondary | ICD-10-CM

## 2021-07-28 DIAGNOSIS — D127 Benign neoplasm of rectosigmoid junction: Secondary | ICD-10-CM

## 2021-07-28 DIAGNOSIS — D128 Benign neoplasm of rectum: Secondary | ICD-10-CM | POA: Diagnosis not present

## 2021-07-28 DIAGNOSIS — D123 Benign neoplasm of transverse colon: Secondary | ICD-10-CM | POA: Diagnosis not present

## 2021-07-28 DIAGNOSIS — D125 Benign neoplasm of sigmoid colon: Secondary | ICD-10-CM | POA: Diagnosis not present

## 2021-07-28 MED ORDER — SODIUM CHLORIDE 0.9 % IV SOLN: 500.0000 mL | Freq: Once | INTRAVENOUS | Status: DC

## 2021-07-28 NOTE — Op Note (Signed)
[facility] Patient Name: [name] Procedure Date: 07/28/2021 1:34 PM MRN: [id number] Endoscopist: Justice Britain , MD Age: 60 Referring MD:  Date of Birth: 1961/02/17 Gender: Female Account #: [id number] Procedure:                Colonoscopy Indications:              Screening for colorectal malignant neoplasm, This                            is the patient's first colonoscopy Medicines:                Monitored Anesthesia Care Procedure:                Pre-Anesthesia Assessment:                           - Prior to the procedure, a History and Physical                            was performed, and patient medications and                            allergies were reviewed. The patient's tolerance of                            previous anesthesia was also reviewed. The risks                            and benefits of the procedure and the sedation                            options and risks were discussed with the patient.                            All questions were answered, and informed consent                            was obtained. Prior Anticoagulants: The patient has                            taken Eliquis (apixaban), last dose was 2 days                            prior to procedure. ASA Grade Assessment: III - A                            patient with severe systemic disease. After                            reviewing the risks and benefits, the patient was                            deemed in satisfactory condition to undergo the  procedure.                           After obtaining informed consent, the colonoscope                            was passed under direct vision. Throughout the                            procedure, the patient's blood pressure, pulse, and                            oxygen saturations were monitored continuously. The                            0441 PCF-H190TL Slim SB Colonoscope was introduced                             through the anus and advanced to the the cecum,                            identified by its appearance. The colonoscopy was                            performed without difficulty. The patient tolerated                            the procedure. The quality of the bowel preparation                            was good. The ileocecal valve, appendiceal orifice,                            and rectum were photographed. Scope In: 1:49:05 PM Scope Out: 2:11:51 PM Scope Withdrawal Time: 0 hours 19 minutes 41 seconds  Total Procedure Duration: 0 hours 22 minutes 46 seconds  Findings:                 The digital rectal exam findings include                            hemorrhoids. Pertinent negatives include no                            palpable rectal lesions.                           The colon (entire examined portion) revealed                            significantly excessive looping.                           Nine sessile polyps were found in the rectum (4),  recto-sigmoid colon (2), sigmoid colon (1) and                            transverse colon (2). The polyps were 2 to 10 mm in                            size. These polyps were removed with a cold snare.                            Resection and retrieval were complete.                           Multiple small-mouthed diverticula were found in                            the recto-sigmoid colon, sigmoid colon and                            descending colon.                           Normal mucosa was found in the entire colon                            otherwise.                           Non-bleeding non-thrombosed external and internal                            hemorrhoids were found during retroflexion, during                            perianal exam and during digital exam. The                            hemorrhoids were Grade II (internal hemorrhoids                            that  prolapse but reduce spontaneously). Complications:            No immediate complications. Estimated Blood Loss:     Estimated blood loss was minimal. Impression:               - Hemorrhoids found on digital rectal exam.                           - There was significant looping of the colon.                           - Nine, 2 to 10 mm polyps in the rectum, at the                            recto-sigmoid colon, in the sigmoid colon and in  the transverse colon, removed with a cold snare.                            Resected and retrieved.                           - Diverticulosis in the recto-sigmoid colon, in the                            sigmoid colon and in the descending colon.                           - Normal mucosa in the entire examined colon                            otherwise.                           - Non-bleeding non-thrombosed external and internal                            hemorrhoids. Recommendation:           - The patient will be observed post-procedure,                            until all discharge criteria are met.                           - Discharge patient to home.                           - Patient has a contact number available for                            emergencies. The signs and symptoms of potential                            delayed complications were discussed with the                            patient. Return to normal activities tomorrow.                            Written discharge instructions were provided to the                            patient.                           - High fiber diet.                           - Use FiberCon 1-2 tablets PO daily.                           - Resume Eliquis (apixaban) at  prior dose in 2 days                            (11/5 AM) to decrease risk of post-interventional                            bleeding..                           - Continue present medications.                            - Await pathology results.                           - Repeat colonoscopy in likely 3 years for                            surveillance based on pathology results.                           - The findings and recommendations were discussed                            with the patient.                           - The findings and recommendations were discussed                            with the patient's family. Justice Britain, MD 07/28/2021 2:19:23 PM

## 2021-07-28 NOTE — Patient Instructions (Signed)
Discharge instructions given. Handouts on polyps,diverticulosis and hemorrhoids. Resume Eliquis at prior dose in 2 days on 07/31/21.  Resume other medications. YOU HAD AN ENDOSCOPIC PROCEDURE TODAY AT [facility]:   Refer to the procedure report that was given to you for any specific questions about what was found during the examination.  If the procedure report does not answer your questions, please call your gastroenterologist to clarify.  If you requested that your care partner not be given the details of your procedure findings, then the procedure report has been included in a sealed envelope for you to review at your convenience later.  YOU SHOULD EXPECT: Some feelings of bloating in the abdomen. Passage of more gas than usual.  Walking can help get rid of the air that was put into your GI tract during the procedure and reduce the bloating. If you had a lower endoscopy (such as a colonoscopy or flexible sigmoidoscopy) you may notice spotting of blood in your stool or on the toilet paper. If you underwent a bowel prep for your procedure, you may not have a normal bowel movement for a few days.  Please Note:  You might notice some irritation and congestion in your nose or some drainage.  This is from the oxygen used during your procedure.  There is no need for concern and it should clear up in a day or so.  SYMPTOMS TO REPORT IMMEDIATELY:  Following lower endoscopy (colonoscopy or flexible sigmoidoscopy):  Excessive amounts of blood in the stool  Significant tenderness or worsening of abdominal pains  Swelling of the abdomen that is new, acute  Fever of 100F or higher   For urgent or emergent issues, a gastroenterologist can be reached at any hour by calling [phone]. Do not use MyChart messaging for urgent concerns.    DIET:  We do recommend a small meal at first, but then you may proceed to your regular diet.  Drink plenty of fluids but you should avoid alcoholic  beverages for 24 hours.  ACTIVITY:  You should plan to take it easy for the rest of today and you should NOT DRIVE or use heavy machinery until tomorrow (because of the sedation medicines used during the test).    FOLLOW UP: Our staff will call the number listed on your records 48-72 hours following your procedure to check on you and address any questions or concerns that you may have regarding the information given to you following your procedure. If we do not reach you, we will leave a message.  We will attempt to reach you two times.  During this call, we will ask if you have developed any symptoms of COVID 19. If you develop any symptoms (ie: fever, flu-like symptoms, shortness of breath, cough etc.) before then, please call [phone].  If you test positive for Covid 19 in the 2 weeks post procedure, please call and report this information to [place].    If any biopsies were taken you will be contacted by phone or by letter within the next 1-3 weeks.  Please call us at [phone] if you have not heard about the biopsies in 3 weeks.    SIGNATURES/CONFIDENTIALITY: You and/or your care partner have signed paperwork which will be entered into your electronic medical record.  These signatures attest to the fact that that the information above on your After Visit Summary has been reviewed and is understood.  Full responsibility of the confidentiality of this discharge information lies with you  and/or your care-partner.

## 2021-07-28 NOTE — Progress Notes (Signed)
GASTROENTEROLOGY PROCEDURE H&P NOTE   Primary Care Physician: [name], DO  HPI: [name] is a 60 y.o. female who presents for Colonoscopy for screening.  Past Medical History:  Diagnosis Date   AAA (abdominal aortic aneurysm)    a. 3.3cm infrarenal by CT 02/2020.   Abnormal findings on endoscopic ultrasound (EUS) 01/25/2020   Abnormality present on pathology (Atypical Pathology) 01/25/2020   Angio-edema    Angioedema    Arthritis    CAD (coronary artery disease)    a. s/p NSTEMI in 10/2017 with cath showing LM disease --> s/p CABG on 11/23/2017 with LIMA-LAD and SVG-OM.    Carotid stenosis    a. Duplex 11/2018 7-71% RICA, 16-57% LICA.   Closed nondisplaced fracture of greater trochanter of right femur ([place]) 04/09/2020   Cystocele, unspecified  03/03/2020   Gallbladder sludge and cholelithiasis without cholecystitis 01/25/2020   Small stones of the gallbladder appreciated on CT 02/2020 as well.   Gastritis and duodenitis 12/06/2019   11/2019 Peptic duodenitis  -  No dysplasia or malignancy identified  B. STOMACH, BIOPSY:  -  Mild chronic gastritis with intestinal metaplasia    Hay fever    History of kidney stones    Idiopathic recurrent acute pancreatitis 01/25/2020   Ischemic cardiomyopathy    a. EF 35-40% by echo in 10/2017. b. 55% by echo 01/2020.   Kidney stone 02/18/2020   Lytic bone lesion of femur    NSTEMI (non-ST elevated myocardial infarction) (HCC)    PAF (paroxysmal atrial fibrillation) ([place])    Diagnosed by ILR   Pancreatitis    Pancreatitis, recurrent 11/07/2019   Pilonidal cyst 10/21/2019   Pneumonia 11/16/2017   Stroke ([place]) 08/23/2017   Left MCA infarct status post TPA and thrombectomy   Urticaria    Past Surgical History:  Procedure Laterality Date   BIOPSY  12/02/2019   Procedure: BIOPSY;  Surgeon: Irving Copas., MD;  Location: [facility];  Service: Gastroenterology;;   CHOLECYSTECTOMY N/A 04/30/2020   Procedure: LAPAROSCOPIC CHOLECYSTECTOMY  WITH INTRAOPERATIVE CHOLANGIOGRAM;  Surgeon: Armandina Gemma, MD;  Location: [facility];  Service: General;  Laterality: N/A;   CORONARY ARTERY BYPASS GRAFT N/A 11/23/2017   Procedure: CORONARY ARTERY BYPASS GRAFTING (CABG) x 2, ON PUMP, USING LEFT INTERNAL MAMMARY ARTERY AND RIGHT GREATER SAPHENOUS VEIN HARVESTED ENDOSCOPICALLY;  Surgeon: Gaye Pollack, MD;  Location: [place];  Service: Open Heart Surgery;  Laterality: N/A;   CRYOABLATION     cervical   ESOPHAGOGASTRODUODENOSCOPY (EGD) WITH PROPOFOL N/A 12/02/2019   Procedure: ESOPHAGOGASTRODUODENOSCOPY (EGD) WITH PROPOFOL;  Surgeon: Rush Landmark Telford Nab., MD;  Location: [place];  Service: Gastroenterology;  Laterality: N/A;   ESOPHAGOGASTRODUODENOSCOPY (EGD) WITH PROPOFOL N/A 03/19/2020   Procedure: ESOPHAGOGASTRODUODENOSCOPY (EGD) WITH PROPOFOL;  Surgeon: Milus Banister, MD;  Location: [facility];  Service: Endoscopy;  Laterality: N/A;   EUS N/A 03/19/2020   Procedure: UPPER ENDOSCOPIC ULTRASOUND (EUS) RADIAL;  Surgeon: Milus Banister, MD;  Location: [facility];  Service: Endoscopy;  Laterality: N/A;   EUS N/A 03/19/2020   Procedure: UPPER ENDOSCOPIC ULTRASOUND (EUS) LINEAR;  Surgeon: Milus Banister, MD;  Location: [facility];  Service: Endoscopy;  Laterality: N/A;   FINE NEEDLE ASPIRATION  12/02/2019   Procedure: FINE NEEDLE ASPIRATION (FNA) LINEAR;  Surgeon: Irving Copas., MD;  Location: [facility];  Service: Gastroenterology;;   FINE NEEDLE ASPIRATION N/A 03/19/2020   Procedure: FINE NEEDLE ASPIRATION (FNA) LINEAR;  Surgeon: Milus Banister, MD;  [facility];  Service: Endoscopy;  Laterality: N/A;   IR ANGIO INTRA EXTRACRAN SEL COM CAROTID INNOMINATE UNI R MOD SED  08/21/2017   IR ANGIO VERTEBRAL SEL SUBCLAVIAN INNOMINATE UNI R MOD SED  08/21/2017   IR PERCUTANEOUS ART THROMBECTOMY/INFUSION INTRACRANIAL INC DIAG ANGIO  08/21/2017   IR RADIOLOGIST EVAL & MGMT  10/30/2017   LOOP RECORDER INSERTION N/A 08/28/2017    Procedure: LOOP RECORDER INSERTION;  Surgeon: Constance Haw, MD;  Location: [facility];  Service: Cardiovascular;  Laterality: N/A;   RADIOLOGY WITH ANESTHESIA N/A 08/21/2017   Procedure: RADIOLOGY WITH ANESTHESIA;  Surgeon: Luanne Bras, MD;  Location: [place];  Service: Radiology;  Laterality: N/A;   RIGHT/LEFT HEART CATH AND CORONARY ANGIOGRAPHY N/A 11/20/2017   Procedure: RIGHT/LEFT HEART CATH AND CORONARY ANGIOGRAPHY;  Surgeon: Jettie Booze, MD;  Location: [facility];  Service: Cardiovascular;  Laterality: N/A;   TEE WITHOUT CARDIOVERSION N/A 08/28/2017   Procedure: TRANSESOPHAGEAL ECHOCARDIOGRAM (TEE);  Surgeon: Fay Records, MD;  Location: [facility];  Service: Cardiovascular;  Laterality: N/A;   TEE WITHOUT CARDIOVERSION N/A 11/23/2017   Procedure: TRANSESOPHAGEAL ECHOCARDIOGRAM (TEE);  Surgeon: Gaye Pollack, MD;  Location: [place];  Service: Open Heart Surgery;  Laterality: N/A;   TUBAL LIGATION     UPPER ESOPHAGEAL ENDOSCOPIC ULTRASOUND (EUS) N/A 12/02/2019   Procedure: UPPER ESOPHAGEAL ENDOSCOPIC ULTRASOUND (EUS);  Surgeon: Irving Copas., MD;  Location: [place];  Service: Gastroenterology;  Laterality: N/A;   Current Outpatient Medications  Medication Sig Dispense Refill   baclofen (LIORESAL) 10 MG tablet Take 1 tablet (10 mg total) by mouth 3 (three) times daily. 90 tablet 5   calcium carbonate (TUMS - DOSED IN MG ELEMENTAL CALCIUM) 500 MG chewable tablet Chew 1 tablet by mouth 2 (two) times daily as needed for indigestion or heartburn.      carvedilol (COREG) 3.125 MG tablet Take 1 tablet (3.125 mg total) by mouth in the morning and at bedtime. 180 tablet 1   cholecalciferol (VITAMIN D) 1000 units tablet Take 1,000 Units by mouth daily.     diclofenac Sodium (VOLTAREN) 1 % GEL APPLY 2 GRAMS TOPICALLY 4 TIMES A DAY 300 g 2   hydrOXYzine (ATARAX/VISTARIL) 10 MG tablet Take 1 tablet (10 mg total) by mouth at bedtime. 90 tablet 1    levocetirizine (XYZAL) 5 MG tablet Take 1 tablet (5 mg total) by mouth every evening. 90 tablet 3   Na Sulfate-K Sulfate-Mg Sulf (SUPREP BOWEL PREP KIT) 17.5-3.13-1.6 GM/177ML SOLN Take 1 kit by mouth as directed. For colonoscopy prep 354 mL 0   pantoprazole (PROTONIX) 20 MG tablet Take 1 tablet (20 mg total) by mouth daily. 90 tablet 0   pravastatin (PRAVACHOL) 20 MG tablet Take 1 tablet (20 mg total) by mouth every evening. 90 tablet 3   rivaroxaban (XARELTO) 20 MG TABS tablet TAKE 1 TABLET BY MOUTH ONCE DAILY WITH EVENING MEAL 30 tablet 5   selenium 200 MCG TABS tablet Take by mouth.     SENNA PO Take by mouth.     traMADol (ULTRAM) 50 MG tablet Take 1-2 tablets (50-100 mg total) by mouth 2 (two) times daily as needed. 120 tablet 5   triamcinolone cream (KENALOG) 0.1 % Apply topically 4 (four) times daily. 80 g 5   vitamin B-12 (CYANOCOBALAMIN) 100 MCG tablet Take 100 mcg by mouth daily.     No current facility-administered medications for this visit.    Current Outpatient Medications:    baclofen (LIORESAL)  10 MG tablet, Take 1 tablet (10 mg total) by mouth 3 (three) times daily., Disp: 90 tablet, Rfl: 5   calcium carbonate (TUMS - DOSED IN MG ELEMENTAL CALCIUM) 500 MG chewable tablet, Chew 1 tablet by mouth 2 (two) times daily as needed for indigestion or heartburn. , Disp: , Rfl:    carvedilol (COREG) 3.125 MG tablet, Take 1 tablet (3.125 mg total) by mouth in the morning and at bedtime., Disp: 180 tablet, Rfl: 1   cholecalciferol (VITAMIN D) 1000 units tablet, Take 1,000 Units by mouth daily., Disp: , Rfl:    diclofenac Sodium (VOLTAREN) 1 % GEL, APPLY 2 GRAMS TOPICALLY 4 TIMES A DAY, Disp: 300 g, Rfl: 2   hydrOXYzine (ATARAX/VISTARIL) 10 MG tablet, Take 1 tablet (10 mg total) by mouth at bedtime., Disp: 90 tablet, Rfl: 1   levocetirizine (XYZAL) 5 MG tablet, Take 1 tablet (5 mg total) by mouth every evening., Disp: 90 tablet, Rfl: 3   Na Sulfate-K Sulfate-Mg Sulf (SUPREP BOWEL PREP KIT)  17.5-3.13-1.6 GM/177ML SOLN, Take 1 kit by mouth as directed. For colonoscopy prep, Disp: 354 mL, Rfl: 0   pantoprazole (PROTONIX) 20 MG tablet, Take 1 tablet (20 mg total) by mouth daily., Disp: 90 tablet, Rfl: 0   pravastatin (PRAVACHOL) 20 MG tablet, Take 1 tablet (20 mg total) by mouth every evening., Disp: 90 tablet, Rfl: 3   rivaroxaban (XARELTO) 20 MG TABS tablet, TAKE 1 TABLET BY MOUTH ONCE DAILY WITH EVENING MEAL, Disp: 30 tablet, Rfl: 5   selenium 200 MCG TABS tablet, Take by mouth., Disp: , Rfl:    SENNA PO, Take by mouth., Disp: , Rfl:    traMADol (ULTRAM) 50 MG tablet, Take 1-2 tablets (50-100 mg total) by mouth 2 (two) times daily as needed., Disp: 120 tablet, Rfl: 5   triamcinolone cream (KENALOG) 0.1 %, Apply topically 4 (four) times daily., Disp: 80 g, Rfl: 5   vitamin B-12 (CYANOCOBALAMIN) 100 MCG tablet, Take 100 mcg by mouth daily., Disp: , Rfl:  Allergies  Allergen Reactions   Hydromorphone Other (See Comments)    Hallucinations Other reaction(s): Hallucination   Lopressor [Metoprolol Tartrate] Swelling    Angioedema   Sacubitril-Valsartan Swelling and Rash    Welts, possible angioedema  Rash   Allegra Allergy [Fexofenadine Hcl] Hives   Fexofenadine Hives   Nsaids Other (See Comments)    On Xarelto Not taking d/t abdominal pain   Gabapentin Rash   Gabapentin (Once-Daily) Rash   Lipitor [Atorvastatin] Diarrhea   Family History  Problem Relation Age of Onset   Hypertension Mother    Hyperlipidemia Mother    Diabetes type II Mother    Urticaria Mother    Heart attack Father    Hypertension Brother    Breast cancer Cousin 83   Immunodeficiency Neg Hx    Eczema Neg Hx    Atopy Neg Hx    Asthma Neg Hx    Angioedema Neg Hx    Allergic rhinitis Neg Hx    Colon cancer Neg Hx    Esophageal cancer Neg Hx    Pancreatic cancer Neg Hx    Colon polyps Neg Hx    Esophageal varices Neg Hx    Inflammatory bowel disease Neg Hx    Rectal cancer Neg Hx    Social  History   Socioeconomic History   Marital status: Married    Spouse name: Not on file   Number of children: 2   Years of education: Not on file  Highest education level: Not on file  Occupational History   Not on file  Tobacco Use   Smoking status: Some Days    Packs/day: 0.10    Years: 37.00    Pack years: 3.70    Types: Cigarettes   Smokeless tobacco: Never  Vaping Use   Vaping Use: Never used  Substance and Sexual Activity   Alcohol use: No   Drug use: No   Sexual activity: Yes    Partners: Male  Other Topics Concern   Not on file  Social History Narrative   Married. 2 children.    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    Walks with cane or wheelchair.    Former smoker.    Drinks caffeine.    Smoke alarm in the home.   Firearms in the home.    Wears her seatbelt.    Feels safe In her relationships.    Social Determinants of Health   Financial Resource Strain: Not on file  Food Insecurity: Not on file  Transportation Needs: Not on file  Physical Activity: Not on file  Stress: Not on file  Social Connections: Not on file  Intimate Partner Violence: Not on file    Physical Exam: There were no vitals filed for this visit. There is no height or weight on file to calculate BMI. GEN: NAD EYE: Sclerae anicteric ENT: MMM CV: Non-tachycardic GI: Soft, NT/ND NEURO:  Alert & Oriented x 3  Lab Results: No results for input(s): WBC, HGB, HCT, PLT in the last 72 hours. BMET No results for input(s): NA, K, CL, CO2, GLUCOSE, BUN, CREATININE, CALCIUM in the last 72 hours. LFT No results for input(s): PROT, ALBUMIN, AST, ALT, ALKPHOS, BILITOT, BILIDIR, IBILI in the last 72 hours. PT/INR No results for input(s): LABPROT, INR in the last 72 hours.   Impression / Plan: This is a 60 y.o.female who presents for Colonoscopy for screening.  The risks and benefits of endoscopic evaluation/treatment were discussed with the patient and/or family; these include but are not  limited to the risk of perforation, infection, bleeding, missed lesions, lack of diagnosis, severe illness requiring hospitalization, as well as anesthesia and sedation related illnesses.  The patient's history has been reviewed, patient examined, no change in status, and deemed stable for procedure.  The patient and/or family is agreeable to proceed.    Justice Britain, MD [facility] Advanced [facility]oscopy Office # [phone]

## 2021-07-28 NOTE — Progress Notes (Signed)
Called to room to assist during endoscopic procedure.  Patient ID and intended procedure confirmed with present staff. Received instructions for my participation in the procedure from the performing physician.  

## 2021-07-28 NOTE — Progress Notes (Signed)
Sedate, gd SR, tolerated procedure well, VSS, report to RN 

## 2021-07-30 ENCOUNTER — Telehealth: Payer: Self-pay

## 2021-07-30 NOTE — Telephone Encounter (Signed)
  Follow up Call-  Call back number 07/28/2021  Post procedure Call Back phone  # [phone]  Permission to leave phone message Yes  Some recent data might be hidden     Patient questions:  Do you have a fever, pain , or abdominal swelling? No. Pain Score  0 *  Have you tolerated food without any problems? Yes.    Have you been able to return to your normal activities? Yes.    Do you have any questions about your discharge instructions: Diet   No. Medications  No. Follow up visit  No.  Do you have questions or concerns about your Care? No.  Actions: * If pain score is 4 or above: No action needed, pain <4.  Have you developed a fever since your procedure? no  2.   Have you had an respiratory symptoms (SOB or cough) since your procedure? no  3.   Have you tested positive for COVID 19 since your procedure no  4.   Have you had any family members/close contacts diagnosed with the COVID 19 since your procedure?  no   If yes to any of these questions please route to Joylene John, RN and Joella Prince, RN

## 2021-08-03 ENCOUNTER — Encounter: Payer: Self-pay | Admitting: Gastroenterology

## 2021-08-17 ENCOUNTER — Encounter: Payer: Self-pay | Admitting: Physical Medicine & Rehabilitation

## 2021-08-17 ENCOUNTER — Encounter: Payer: 59 | Attending: Physical Medicine & Rehabilitation | Admitting: Physical Medicine & Rehabilitation

## 2021-08-17 ENCOUNTER — Other Ambulatory Visit: Payer: Self-pay

## 2021-08-17 VITALS — BP 103/63 | HR 85 | Temp 98.4°F | Ht 60.0 in | Wt 123.0 lb

## 2021-08-17 DIAGNOSIS — M25562 Pain in left knee: Secondary | ICD-10-CM | POA: Diagnosis present

## 2021-08-17 DIAGNOSIS — G8929 Other chronic pain: Secondary | ICD-10-CM | POA: Diagnosis present

## 2021-08-17 DIAGNOSIS — M25561 Pain in right knee: Secondary | ICD-10-CM | POA: Diagnosis present

## 2021-08-17 NOTE — Progress Notes (Signed)
60 year old female with chronic bilateral knee pain.  She has imaging studies from 09/28/2020 showing moderately severe medial joint space narrowing bilaterally.  She has tried Synvisc injections without much relief as well as corticosteroid injections.  She has had a history of CVA and nonsteroidal anti-inflammatories are relatively contraindicated.  Her pain is worse on the left side she bear weight primarily on that side because of right hemiparesis from prior CVA  Physical examination General no acute distress Mood and affect are appropriate Motor strength is 4/5 in the right and 5/5 left deltoid, bicep, tricep, grip, hip flexor, knee extensor, ankle dorsiflexor and plantar second flexor   Bilateral knee OA which is unresponsive to med management , PT, Corticosteroid and hyaluronic acid injection  Discused genicular nerve block and RF, pt in agreement, questions answered, will schedule .

## 2021-09-10 ENCOUNTER — Other Ambulatory Visit: Payer: Self-pay

## 2021-09-10 ENCOUNTER — Ambulatory Visit (INDEPENDENT_AMBULATORY_CARE_PROVIDER_SITE_OTHER): Payer: 59

## 2021-09-10 DIAGNOSIS — Z23 Encounter for immunization: Secondary | ICD-10-CM

## 2021-09-29 ENCOUNTER — Encounter (HOSPITAL_BASED_OUTPATIENT_CLINIC_OR_DEPARTMENT_OTHER): Payer: Self-pay | Admitting: Obstetrics & Gynecology

## 2021-09-29 ENCOUNTER — Other Ambulatory Visit (HOSPITAL_COMMUNITY)
Admission: RE | Admit: 2021-09-29 | Discharge: 2021-09-29 | Disposition: A | Discharge status: Home / Self Care | Payer: 59 | Source: Ambulatory Visit | Attending: Obstetrics & Gynecology | Admitting: Obstetrics & Gynecology

## 2021-09-29 ENCOUNTER — Other Ambulatory Visit: Payer: Self-pay

## 2021-09-29 ENCOUNTER — Ambulatory Visit (INDEPENDENT_AMBULATORY_CARE_PROVIDER_SITE_OTHER): Payer: 59 | Admitting: Obstetrics & Gynecology

## 2021-09-29 VITALS — BP 132/83 | HR 88 | Ht 61.0 in | Wt 124.8 lb

## 2021-09-29 DIAGNOSIS — I63512 Cerebral infarction due to unspecified occlusion or stenosis of left middle cerebral artery: Secondary | ICD-10-CM

## 2021-09-29 DIAGNOSIS — N901 Moderate vulvar dysplasia: Secondary | ICD-10-CM | POA: Diagnosis not present

## 2021-09-29 DIAGNOSIS — N9089 Other specified noninflammatory disorders of vulva and perineum: Secondary | ICD-10-CM

## 2021-09-29 DIAGNOSIS — Z124 Encounter for screening for malignant neoplasm of cervix: Secondary | ICD-10-CM

## 2021-09-29 DIAGNOSIS — Z780 Asymptomatic menopausal state: Secondary | ICD-10-CM | POA: Diagnosis not present

## 2021-09-29 DIAGNOSIS — Z01419 Encounter for gynecological examination (general) (routine) without abnormal findings: Secondary | ICD-10-CM | POA: Diagnosis not present

## 2021-09-29 NOTE — Progress Notes (Signed)
61 y.o. G2P2 Married White or Caucasian female here for annual exam/new patient exam.  She has not cycled in more than 10 years.  Was never on HRT.  Reports she hasn't had pap smear in more than 30 years.  H/o abnormal pap smear that was treated with cryo to the cervix.    Had a breast lump in September.  Area was biopsied and findings were consistent with ruptured cyst and necrosis.    No LMP recorded (lmp unknown). Patient is postmenopausal.          Sexually active: Yes.    The current method of family planning is post menopausal status.    Smoker:  yes  Health Maintenance: Pap:  obtained today History of abnormal Pap:  no MMG:  10/06/2020 Additional images recommended Colonoscopy:  07/28/2021, adenomatous polyps, follow up 3 years BMD:   ordered by Dr. Raoul Pitch Screening Labs: 05/2021   reports that she has been smoking cigarettes. She has a 3.70 pack-year smoking history. She has never used smokeless tobacco. She reports that she does not drink alcohol and does not use drugs.  Past Medical History:  Diagnosis Date   AAA (abdominal aortic aneurysm)    a. 3.3cm infrarenal by CT 02/2020.   Abnormal findings on endoscopic ultrasound (EUS) 01/25/2020   Abnormality present on pathology (Atypical Pathology) 01/25/2020   Allergy    Angio-edema    Angioedema    Arthritis    CAD (coronary artery disease)    a. s/p NSTEMI in 10/2017 with cath showing LM disease --> s/p CABG on 11/23/2017 with LIMA-LAD and SVG-OM.    Carotid stenosis    a. Duplex 11/2018 3-38% RICA, 25-05% LICA.   Closed nondisplaced fracture of greater trochanter of right femur ([place]) 04/09/2020   Cystocele, unspecified  03/03/2020   Gallbladder sludge and cholelithiasis without cholecystitis 01/25/2020   Small stones of the gallbladder appreciated on CT 02/2020 as well.   Gastritis and duodenitis 12/06/2019   11/2019 Peptic duodenitis  -  No dysplasia or malignancy identified  B. STOMACH, BIOPSY:  -  Mild chronic gastritis  with intestinal metaplasia    Hay fever    History of kidney stones    Hyperlipidemia    Idiopathic recurrent acute pancreatitis 01/25/2020   Ischemic cardiomyopathy    a. EF 35-40% by echo in 10/2017. b. 55% by echo 01/2020.   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   Urticaria     Past Surgical History:  Procedure Laterality Date   BIOPSY  12/02/2019   Procedure: BIOPSY;  Surgeon: Irving Copas., MD;  Location: [facility];  Service: Gastroenterology;;   CHOLECYSTECTOMY N/A 04/30/2020   Procedure: LAPAROSCOPIC CHOLECYSTECTOMY WITH INTRAOPERATIVE CHOLANGIOGRAM;  Surgeon: Armandina Gemma, MD;  Location: [facility];  Service: General;  Laterality: N/A;   CORONARY ARTERY BYPASS GRAFT N/A 11/23/2017   Procedure: CORONARY ARTERY BYPASS GRAFTING (CABG) x 2, ON PUMP, USING LEFT INTERNAL MAMMARY ARTERY AND RIGHT GREATER SAPHENOUS VEIN HARVESTED ENDOSCOPICALLY;  Surgeon: Gaye Pollack, MD;  Location: [facility];  Service: Open Heart Surgery;  Laterality: N/A;   CRYOABLATION     cervical   ESOPHAGOGASTRODUODENOSCOPY (  EGD) WITH PROPOFOL N/A 12/02/2019   Procedure: ESOPHAGOGASTRODUODENOSCOPY (EGD) WITH PROPOFOL;  Surgeon: Rush Landmark Telford Nab., MD;  Location: [facility];  Service: Gastroenterology;  Laterality: N/A;   ESOPHAGOGASTRODUODENOSCOPY (EGD) WITH PROPOFOL N/A 03/19/2020   Procedure: ESOPHAGOGASTRODUODENOSCOPY (EGD) WITH PROPOFOL;  Surgeon: Milus Banister, MD;  [facility];  Service: Endoscopy;  Laterality: N/A;   EUS N/A 03/19/2020   Procedure: UPPER ENDOSCOPIC ULTRASOUND (EUS) RADIAL;  Surgeon: Milus Banister, MD;  [facility];  Service: Endoscopy;   Laterality: N/A;   EUS N/A 03/19/2020   Procedure: UPPER ENDOSCOPIC ULTRASOUND (EUS) LINEAR;  Surgeon: Milus Banister, MD;  [facility];  Service: Endoscopy;  Laterality: N/A;   FINE NEEDLE ASPIRATION  12/02/2019   Procedure: FINE NEEDLE ASPIRATION (FNA) LINEAR;  Surgeon: Irving Copas., MD;  Location: [place];  Service: Gastroenterology;;   FINE NEEDLE ASPIRATION N/A 03/19/2020   Procedure: FINE NEEDLE ASPIRATION (FNA) LINEAR;  Surgeon: Milus Banister, MD;  [facility];  Service: Endoscopy;  Laterality: N/A;   IR ANGIO INTRA EXTRACRAN SEL COM CAROTID INNOMINATE UNI R MOD SED  08/21/2017   IR ANGIO VERTEBRAL SEL SUBCLAVIAN INNOMINATE UNI R MOD SED  08/21/2017   IR PERCUTANEOUS ART THROMBECTOMY/INFUSION INTRACRANIAL INC DIAG ANGIO  08/21/2017   IR RADIOLOGIST EVAL & MGMT  10/30/2017   LOOP RECORDER INSERTION N/A 08/28/2017   Procedure: LOOP RECORDER INSERTION;  Surgeon: Constance Haw, MD;  Location: [facility];  Service: Cardiovascular;  Laterality: N/A;   RADIOLOGY WITH ANESTHESIA N/A 08/21/2017   Procedure: RADIOLOGY WITH ANESTHESIA;  Surgeon: Luanne Bras, MD;  Location: [facility];  Service: Radiology;  Laterality: N/A;   RIGHT/LEFT HEART CATH AND CORONARY ANGIOGRAPHY N/A 11/20/2017   Procedure: RIGHT/LEFT HEART CATH AND CORONARY ANGIOGRAPHY;  Surgeon: Jettie Booze, MD;  Location: [facility];  Service: Cardiovascular;  Laterality: N/A;   TEE WITHOUT CARDIOVERSION N/A 08/28/2017   Procedure: TRANSESOPHAGEAL ECHOCARDIOGRAM (TEE);  Surgeon: Fay Records, MD;  Location: [facility];  Service: Cardiovascular;  Laterality: N/A;   TEE WITHOUT CARDIOVERSION N/A 11/23/2017   Procedure: TRANSESOPHAGEAL ECHOCARDIOGRAM (TEE);  Surgeon: Gaye Pollack, MD;  Location: [facility];  Service: Open Heart Surgery;  Laterality: N/A;   TUBAL LIGATION     UPPER ESOPHAGEAL ENDOSCOPIC ULTRASOUND (EUS) N/A 12/02/2019   Procedure: UPPER ESOPHAGEAL ENDOSCOPIC  ULTRASOUND (EUS);  Surgeon: Irving Copas., MD;  Location: [facility];  Service: Gastroenterology;  Laterality: N/A;    Current Outpatient Medications  Medication Sig Dispense Refill   baclofen (LIORESAL) 10 MG tablet Take 1 tablet (10 mg total) by mouth 3 (three) times daily. 90 tablet 5   calcium carbonate (TUMS - DOSED IN MG ELEMENTAL CALCIUM) 500 MG chewable tablet Chew 1 tablet by mouth 2 (two) times daily as needed for indigestion or heartburn.      carvedilol (COREG) 3.125 MG tablet Take 1 tablet (3.125 mg total) by mouth in the morning and at bedtime. 180 tablet 1   cholecalciferol (VITAMIN D) 1000 units tablet Take 1,000 Units by mouth daily.     diclofenac Sodium (VOLTAREN) 1 % GEL APPLY 2 GRAMS TOPICALLY 4 TIMES A DAY 300 g 2   hydrOXYzine (ATARAX/VISTARIL) 10 MG tablet Take 1 tablet (10 mg total) by mouth at bedtime. 90 tablet 1   levocetirizine (XYZAL) 5 MG tablet Take 1 tablet (5 mg total) by mouth every evening. 90 tablet 3   pantoprazole (PROTONIX) 20 MG tablet Take 1 tablet (20  mg total) by mouth daily. 90 tablet 0   pravastatin (PRAVACHOL) 20 MG tablet Take 1 tablet (20 mg total) by mouth every evening. 90 tablet 3   rivaroxaban (XARELTO) 20 MG TABS tablet TAKE 1 TABLET BY MOUTH ONCE DAILY WITH EVENING MEAL 30 tablet 5   selenium 200 MCG TABS tablet Take by mouth.     SENNA PO Take by mouth.     traMADol (ULTRAM) 50 MG tablet Take 1-2 tablets (50-100 mg total) by mouth 2 (two) times daily as needed. 120 tablet 5   triamcinolone cream (KENALOG) 0.1 % Apply topically 4 (four) times daily. 80 g 5   vitamin B-12 (CYANOCOBALAMIN) 100 MCG tablet Take 100 mcg by mouth daily.     No current facility-administered medications for this visit.    Family History  Problem Relation Age of Onset   Colon polyps Mother    Hypertension Mother    Hyperlipidemia Mother    Diabetes type II Mother    Urticaria Mother    Heart attack Father    Hypertension Brother    Stomach  cancer Maternal Grandfather    Breast cancer Cousin 67   Immunodeficiency Neg Hx    Eczema Neg Hx    Atopy Neg Hx    Asthma Neg Hx    Angioedema Neg Hx    Allergic rhinitis Neg Hx    Colon cancer Neg Hx    Esophageal cancer Neg Hx    Pancreatic cancer Neg Hx    Esophageal varices Neg Hx    Inflammatory bowel disease Neg Hx    Rectal cancer Neg Hx     Review of Systems  Constitutional: Negative.   Gastrointestinal: Negative.   Genitourinary: Negative.    Exam:   BP 132/83 (BP Location: Left Arm, Patient Position: Sitting, Cuff Size: Normal)    Pulse 88    Ht 5\' 1"  (1.549 m) Comment: reported   Wt 124 lb 12.8 oz (56.6 kg)    LMP  (LMP Unknown) Comment: at age 40   BMI 23.58 kg/m   Height: 5\' 1"  (154.9 cm) (reported)  General appearance: alert, cooperative and appears stated age Head: Normocephalic, without obvious abnormality, atraumatic Neck: no adenopathy, supple, symmetrical, trachea midline and thyroid normal to inspection and palpation Lungs: clear to auscultation bilaterally Breasts: normal appearance, no masses or tenderness on left, small 2cm nodular area on right breast at 12 o'clock which is location for biopsy done earlier this year, no skin changes, nipple discharge, LAD noted Heart: regular rate and rhythm Abdomen: soft, non-tender; bowel sounds normal; no masses,  no organomegaly Extremities: extremities normal, atraumatic, no cyanosis or edema Skin: Skin color, texture, turgor normal. No rashes or lesions Lymph nodes: Cervical, supraclavicular, and axillary nodes normal. No abnormal inguinal nodes palpated Neurologic: Grossly normal   Pelvic: External genitalia:  pink, flat lesion on inferior lower left inner labia majora, biopsy recommended              Urethra:  normal appearing urethra with no masses, tenderness or lesions              Bartholins and Skenes: normal                 Vagina: normal appearing vagina with normal color and no discharge, no  lesions              Cervix: no lesions              Pap taken:  Yes.   Bimanual Exam:  Uterus:  normal size, contour, position, consistency, mobility, non-tender              Adnexa: normal adnexa and no mass, fullness, tenderness               Rectovaginal: Confirms               Anus:  normal sphincter tone, no lesions  Procedure:  Area cleansed with Betadine.  Sterile technique used throughout procedure.  Skin anesthestized with Lidocaine 2% plain; 1.0 mL.  4 punch biopsy used to obtain specimen.  Biopsy grasped with pick-ups and excised with scissors.  Adequate hemostasis obtained with silver nitrate sticks.  Dressing was not applied.  Pt tolerated procedure well.  Chaperone, Mariel Craft, RN, was present for exam.  Assessment/Plan: 1. Well woman exam with routine gynecological exam - pap and HR HPV obtained today - MMG 10/06/2020  with biopsy showing calcifications and fat necrosis, follow up 05/2021 stable - colonoscopy 07/28/2201, follow up 3 years - BMD has been ordered - Dr. Raoul Pitch follows blood work - vaccines reviewed/updated  2. Cervical cancer screening - Cytology - PAP( [place])  3. Vulvar lesion - biopsy recommended and obtained today - Surgical pathology( [place])  4. Postmenopausal - no HRT  5. Left middle cerebral artery stroke ([place])

## 2021-10-04 LAB — CYTOLOGY - PAP
Comment: NEGATIVE
Diagnosis: NEGATIVE
High risk HPV: NEGATIVE

## 2021-10-04 LAB — SURGICAL PATHOLOGY

## 2021-10-12 ENCOUNTER — Other Ambulatory Visit: Payer: Self-pay

## 2021-10-12 ENCOUNTER — Other Ambulatory Visit (HOSPITAL_COMMUNITY)
Admission: RE | Admit: 2021-10-12 | Discharge: 2021-10-12 | Disposition: A | Discharge status: Home / Self Care | Payer: 59 | Source: Ambulatory Visit | Attending: Obstetrics & Gynecology | Admitting: Obstetrics & Gynecology

## 2021-10-12 ENCOUNTER — Encounter (HOSPITAL_BASED_OUTPATIENT_CLINIC_OR_DEPARTMENT_OTHER): Payer: Self-pay | Admitting: Obstetrics & Gynecology

## 2021-10-12 ENCOUNTER — Ambulatory Visit (INDEPENDENT_AMBULATORY_CARE_PROVIDER_SITE_OTHER): Payer: 59 | Admitting: Obstetrics & Gynecology

## 2021-10-12 VITALS — Ht 61.0 in | Wt 124.8 lb

## 2021-10-12 DIAGNOSIS — D071 Carcinoma in situ of vulva: Secondary | ICD-10-CM

## 2021-10-12 DIAGNOSIS — N901 Moderate vulvar dysplasia: Secondary | ICD-10-CM

## 2021-10-12 MED ORDER — SULFAMETHOXAZOLE-TRIMETHOPRIM 800-160 MG PO TABS: 1.0000 | ORAL_TABLET | Freq: Two times a day (BID) | ORAL | 0 refills | Status: DC

## 2021-10-13 NOTE — Progress Notes (Signed)
61 y.o. G2P2 Married White female here for colposcopy of the vulva and excision of vulvar lesion due to biopsy showing VIN 2/3.  Area was noted on new patient exam.  Pt reports she's had condyloma in the past but biopsy did not show this.  No LMP recorded (lmp unknown). Patient is postmenopausal.             Patient has been counseled about results and procedure.  Risks and benefits have bene reviewed including immediate and/or delayed bleeding, infection, cervical scaring from procedure, possibility of needing additional follow up as well as treatment.  Rare risks of missing a lesion discussed as well.  All questions answered.  Pt ready to proceed.  Consent obtained.  BP (P) 117/75    Pulse (P) 84    Ht 5\' 1"  (1.549 m)    Wt 124 lb 12.8 oz (56.6 kg)    LMP  (LMP Unknown) Comment: at age 61   BMI 23.58 kg/m   General appearance: alert, cooperative and appears stated age Lymph nodes: No abnormal inguinal nodes palpated Neurologic: Grossly normal  Pelvic: External genitalia:  raised flesh color lesion on left inner labia majora noted in image below              Urethra:  normal appearing urethra with no masses, tenderness or lesions              Bartholins and Skenes: normal                 Vagina: normal appearing vagina with normal color and no discharge, no lesions               Physical Exam Constitutional:      Appearance: Normal appearance.  Genitourinary:   Skin:    General: Skin is warm.  Neurological:     General: No focal deficit present.     Mental Status: She is alert.  Psychiatric:        Mood and Affect: Mood normal.    Speculum placed.  3% acetic acid applied to cervix for ~3 minutes.  Entire vulva visualized in systematic fashion starting at top of vular and working around in clock wise fashion.  7.5x magnification was noted.  Lesion was a little large with colposcopy than just with visual inspection but no other lesion were noted.  Consent already obtained for  colposcopy and excision of vulvar lesion.  Area cleansed with Betadine.  Sterile technique used throughout procedure.  Skin anesthestized with Lidocaine 1% plain; 2.82mL.  Full lesion excised with #11 blade.  This was identified with stitch at 12 o'clock.  Lesion then closed with single interrupted sutures of #4.0 Vicryl.  Excellent hemostasis was present.  Dressing not applied due   Chaperone, Britt Bottom, CMA, was present during procedure.  Assessment/Plan: 1. Vulvar intraepithelial neoplasia (VIN) grade 3 - Surgical pathology( [facility]/ [facility]) - results will be called to pt.  If negative margins, pt will need follow up in 6 months - sulfamethoxazole-trimethoprim (BACTRIM DS) 800-160 MG tablet; Take 1 tablet by mouth 2 (two) times daily.  Dispense: 10 tablet; Refill: 0  2. Vulvar intraepithelial neoplasia (VIN) grade 2

## 2021-10-14 ENCOUNTER — Encounter: Payer: Self-pay | Admitting: Physical Medicine & Rehabilitation

## 2021-10-14 ENCOUNTER — Other Ambulatory Visit: Payer: Self-pay

## 2021-10-14 ENCOUNTER — Encounter: Payer: 59 | Attending: Physical Medicine & Rehabilitation | Admitting: Physical Medicine & Rehabilitation

## 2021-10-14 VITALS — BP 89/53 | HR 81 | Temp 98.0°F | Resp 93 | Ht 61.0 in | Wt 124.4 lb

## 2021-10-14 DIAGNOSIS — M170 Bilateral primary osteoarthritis of knee: Secondary | ICD-10-CM

## 2021-10-14 NOTE — Patient Instructions (Signed)
Genicular nerve blocks were performed today. This is to block pain signals from the knee. A local anesthetic was used to block the knee therefore this will not be a permanent procedure. Please keep track of your pain today and compared to the pain that you had prior to the injection. At next visit we will discuss the results. If it is helpful but only short-term we would need to confirm this with an additional injection prior to proceeding with a radiofrequency neurotomy

## 2021-10-14 NOTE — Progress Notes (Signed)
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 Patient placed supine on the fluoroscopy table AP images of the knee joint were obtained. A 25-gauge 1.5 inch needle was used to anesthetize the skin and subcutaneous tissue with 1% lidocaine, 1 cc at each of 3 locations. Then a 25-gauge 1.5"  needle was inserted targeting the junction of the medial flare of the tibia with the shaft of the tibia, bone contact made and confirmed with lateral imaging. Then Isovue 200  x0.5 mL demonstrated no intravascular uptake followed by injection of 1.100ml .25% bupivacaine. Then the junction of the medial epicondyles of the femur with the femoral shaft was targeted needle was advanced under fluoroscopic guidance until bone contact. Appropriate depth was obtained and confirmed with lateral images. Then Isovue 200 x0.5 mL demonstrated no intravascular uptake followed by injection of 1.46ml of .25% bupivacaine. Then the junction of the lateral femoral condyle with the femoral shaft was targeted. 22-gauge 3.5 inch needle was advanced under fluoroscopic guidance until bone contact. Appropriate depth was confirmed with lateral imaging. 0.5 mL of Isovue 200 injected followed by injection of 1.5 cc of .25% bupivacaine solution. Patient tolerated procedure well. Post procedure instructions given

## 2021-10-14 NOTE — Progress Notes (Signed)
°  PROCEDURE RECORD [facility]   Name: [name] DOB:[birth year] MRN: [id number]  Date:10/14/2021  Physician: Alysia Penna, MD    Nurse/CMA: Ronnald Ramp, RMA  Allergies:  Allergies  Allergen Reactions   Hydromorphone Other (See Comments)    Hallucinations Other reaction(s): Hallucination   Lopressor [Metoprolol Tartrate] Swelling    Angioedema   Sacubitril-Valsartan Swelling and Rash    Welts, possible angioedema  Rash   Allegra Allergy [Fexofenadine Hcl] Hives   Fexofenadine Hives   Nsaids Other (See Comments)    On Xarelto Not taking d/t abdominal pain   Gabapentin Rash   Gabapentin (Once-Daily) Rash   Lipitor [Atorvastatin] Diarrhea    Consent Signed: Yes.    Is patient diabetic? No.  CBG today? .  Pregnant: No. LMP: No LMP recorded (lmp unknown). Patient is postmenopausal. (age 31-55)  Anticoagulants: yes (Xarelto) Anti-inflammatory: no Antibiotics: yes (Bactrim)  Procedure: Left Knee Genicular Nerve Block  Position: Supine Start Time: 1:01 pm  End Time: 1:12 PM  Fluoro Time: 39  RN/CMA Jones, RMA Jones, RMA    Time 12:41 pm 1:17 PM    BP 89/53 124/71    Pulse 81 82    Respirations 16 18    O2 Sat 93 95    S/S 6 6    Pain Level 7/10 5/10     D/C home with Lelon Frohlich, patient A & O X 3, D/C instructions reviewed, and sits independently.

## 2021-10-15 LAB — SURGICAL PATHOLOGY

## 2021-10-21 ENCOUNTER — Encounter (HOSPITAL_BASED_OUTPATIENT_CLINIC_OR_DEPARTMENT_OTHER): Payer: Self-pay | Admitting: Obstetrics & Gynecology

## 2021-10-21 ENCOUNTER — Ambulatory Visit (INDEPENDENT_AMBULATORY_CARE_PROVIDER_SITE_OTHER): Payer: 59 | Admitting: Obstetrics & Gynecology

## 2021-10-21 ENCOUNTER — Other Ambulatory Visit: Payer: Self-pay

## 2021-10-21 VITALS — BP 111/78 | HR 89 | Ht 61.0 in | Wt 126.2 lb

## 2021-10-21 DIAGNOSIS — N901 Moderate vulvar dysplasia: Secondary | ICD-10-CM

## 2021-10-21 DIAGNOSIS — D071 Carcinoma in situ of vulva: Secondary | ICD-10-CM

## 2021-10-28 DIAGNOSIS — D071 Carcinoma in situ of vulva: Secondary | ICD-10-CM | POA: Insufficient documentation

## 2021-10-28 NOTE — Progress Notes (Signed)
GYNECOLOGY  VISIT  CC:   suture removal, discuss pathology  HPI: 61 y.o. 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  Pathology did show positive margins.  Discussed with pt.  Area needs to fully heal and then proceed with additional therapy which could be CO2 laser treatment or topical therapy.  Pt is on Xarelto with hx of stroke.  Will proceed with topical therapy with Aldara for 16 weeks.  Will wait to start until more healing has occurred.  GYNECOLOGIC HISTORY: No LMP recorded (lmp unknown). Patient is postmenopausal.  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   Colon cancer screening 07/15/2021   Gastroesophageal reflux disease 02/26/2021   CHF with cardiomyopathy ([place]) 07/24/2020   High serum carbohydrate antigen 19-9 (CA19-9) 04/09/2020   Idiopathic recurrent acute pancreatitis 01/25/2020   Hashimoto's thyroiditis 10/22/2019   Pancreatic cyst 09/11/2019   AAA (abdominal aortic aneurysm) ([place]), infrarenal - ulcerative plaque (3.1cm)- 09/10/2019 09/11/2019   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   Carpal tunnel syndrome of left wrist 03/15/2019   Anxiety 10/24/2018   Spastic hemiparesis affecting dominant side ([place]) 09/20/2018   Primary osteoarthritis of both knees 07/04/2018   Iron deficiency 04/23/2018   Atherosclerosis of aorta ([place]) 02/02/2018   Atrial fibrillation, chronic (HCC) 12/19/2017   Chronic diastolic CHF (congestive heart failure) ([place]) 12/16/2017   Benign essential HTN    S/P CABG x 2 11/23/2017   Coronary atherosclerosis of autologous artery bypass graft without angina 11/23/2017   NSTEMI (non-ST elevated myocardial infarction) ([place]) 11/16/2017   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 3.3cm infrarenal by CT 02/2020.   Abnormal findings on endoscopic ultrasound (EUS) 01/25/2020   Abnormality present on pathology (Atypical Pathology) 01/25/2020   Allergy    Angio-edema    Angioedema    Arthritis    CAD (coronary artery disease)    a. s/p NSTEMI in 10/2017 with cath showing LM disease --> s/p CABG on 11/23/2017 with LIMA-LAD and SVG-OM.    Carotid stenosis    a. Duplex 11/2018 2-44% RICA, 01-02% LICA.   Closed nondisplaced fracture of greater trochanter of right femur ([place]) 04/09/2020   Cystocele, unspecified  03/03/2020   Gallbladder sludge and cholelithiasis without cholecystitis 01/25/2020   Small stones of the gallbladder appreciated on CT 02/2020 as well.   Gastritis and duodenitis 12/06/2019   11/2019 Peptic duodenitis  -  No dysplasia or malignancy identified  B. STOMACH, BIOPSY:  -  Mild chronic gastritis with intestinal metaplasia    Hay fever    History of kidney stones    Hyperlipidemia    Idiopathic recurrent acute pancreatitis 01/25/2020   Ischemic cardiomyopathy    a. EF 35-40% by echo in 10/2017. b. 55% by echo 01/2020.   Kidney stone 02/18/2020   Lytic bone lesion of femur    NSTEMI (non-ST elevated myocardial infarction) ([place])    PAF (paroxysmal atrial fibrillation) ([place])    Diagnosed by ILR   Pancreatitis    Pancreatitis, recurrent 11/07/2019   Pilonidal cyst 10/21/2019   Pneumonia 11/16/2017   Stroke ([place]) 08/23/2017   Left MCA infarct status post  TPA and thrombectomy   Urticaria     Past Surgical History:  Procedure Laterality Date   BIOPSY  12/02/2019   Procedure: BIOPSY;  Surgeon: Rush Landmark, Telford Nab., MD;  Location: [facility];  Service: Gastroenterology;;   CHOLECYSTECTOMY N/A 04/30/2020   Procedure: LAPAROSCOPIC CHOLECYSTECTOMY WITH INTRAOPERATIVE CHOLANGIOGRAM;  Surgeon: Armandina Gemma, MD;  Location: [facility];  Service: General;  Laterality:  N/A;   CORONARY ARTERY BYPASS GRAFT N/A 11/23/2017   Procedure: CORONARY ARTERY BYPASS GRAFTING (CABG) x 2, ON PUMP, USING LEFT INTERNAL MAMMARY ARTERY AND RIGHT GREATER SAPHENOUS VEIN HARVESTED ENDOSCOPICALLY;  Surgeon: Gaye Pollack, MD;  Location: [facility];  Service: Open Heart Surgery;  Laterality: N/A;   CRYOABLATION     cervical   ESOPHAGOGASTRODUODENOSCOPY (EGD) WITH PROPOFOL N/A 12/02/2019   Procedure: ESOPHAGOGASTRODUODENOSCOPY (EGD) WITH PROPOFOL;  Surgeon: Rush Landmark Telford Nab., MD;  Location: [facility];  Service: Gastroenterology;  Laterality: N/A;   ESOPHAGOGASTRODUODENOSCOPY (EGD) WITH PROPOFOL N/A 03/19/2020   Procedure: ESOPHAGOGASTRODUODENOSCOPY (EGD) WITH PROPOFOL;  Surgeon: Milus Banister, MD;  [facility];  Service: Endoscopy;  Laterality: N/A;   EUS N/A 03/19/2020   Procedure: UPPER ENDOSCOPIC ULTRASOUND (EUS) RADIAL;  Surgeon: Milus Banister, MD;  [facility];  Service: Endoscopy;  Laterality: N/A;   EUS N/A 03/19/2020   Procedure: UPPER ENDOSCOPIC ULTRASOUND (EUS) LINEAR;  Surgeon: Milus Banister, MD;  [facility];  Service: Endoscopy;  Laterality: N/A;   FINE NEEDLE ASPIRATION  12/02/2019   Procedure: FINE NEEDLE ASPIRATION (FNA) LINEAR;  Surgeon: Irving Copas., MD;  Location: [facility];  Service: Gastroenterology;;   FINE NEEDLE ASPIRATION N/A 03/19/2020   Procedure: FINE NEEDLE ASPIRATION (FNA) LINEAR;  Surgeon: Milus Banister, MD;  [facility];  Service: Endoscopy;  Laterality: N/A;   IR ANGIO INTRA EXTRACRAN SEL COM CAROTID INNOMINATE UNI R MOD SED  08/21/2017   IR ANGIO VERTEBRAL SEL SUBCLAVIAN INNOMINATE UNI R MOD SED  08/21/2017   IR PERCUTANEOUS ART THROMBECTOMY/INFUSION INTRACRANIAL INC DIAG ANGIO  08/21/2017   IR RADIOLOGIST EVAL & MGMT  10/30/2017   LOOP RECORDER INSERTION N/A 08/28/2017   Procedure: LOOP RECORDER INSERTION;  Surgeon: Constance Haw, MD;  Location: [facility];  Service:  Cardiovascular;  Laterality: N/A;   RADIOLOGY WITH ANESTHESIA N/A 08/21/2017   Procedure: RADIOLOGY WITH ANESTHESIA;  Surgeon: Luanne Bras, MD;  Location: [facility];  Service: Radiology;  Laterality: N/A;   RIGHT/LEFT HEART CATH AND CORONARY ANGIOGRAPHY N/A 11/20/2017   Procedure: RIGHT/LEFT HEART CATH AND CORONARY ANGIOGRAPHY;  Surgeon: Jettie Booze, MD;  Location: [facility];  Service: Cardiovascular;  Laterality: N/A;   TEE WITHOUT CARDIOVERSION N/A 08/28/2017   Procedure: TRANSESOPHAGEAL ECHOCARDIOGRAM (TEE);  Surgeon: Fay Records, MD;  Location: [facility];  Service: Cardiovascular;  Laterality: N/A;   TEE WITHOUT CARDIOVERSION N/A 11/23/2017   Procedure: TRANSESOPHAGEAL ECHOCARDIOGRAM (TEE);  Surgeon: Gaye Pollack, MD;  Location: [facility];  Service: Open Heart Surgery;  Laterality: N/A;   TUBAL LIGATION     UPPER ESOPHAGEAL ENDOSCOPIC ULTRASOUND (EUS) N/A 12/02/2019   Procedure: UPPER ESOPHAGEAL ENDOSCOPIC ULTRASOUND (EUS);  Surgeon: Irving Copas., MD;  Location: [facility];  Service: Gastroenterology;  Laterality: N/A;    MEDS:   Current Outpatient Medications on File Prior to Visit  Medication Sig Dispense Refill   baclofen (LIORESAL) 10 MG tablet Take 1 tablet (10 mg total) by mouth 3 (three) times daily. 90 tablet 5   calcium carbonate (TUMS - DOSED  IN MG ELEMENTAL CALCIUM) 500 MG chewable tablet Chew 1 tablet by mouth 2 (two) times daily as needed for indigestion or heartburn.      carvedilol (COREG) 3.125 MG tablet Take 1 tablet (3.125 mg total) by mouth in the morning and at bedtime. 180 tablet 1   cholecalciferol (VITAMIN D) 1000 units tablet Take 1,000 Units by mouth daily.     diclofenac Sodium (VOLTAREN) 1 % GEL APPLY 2 GRAMS TOPICALLY 4 TIMES A DAY 300 g 2   hydrOXYzine (ATARAX/VISTARIL) 10 MG tablet Take 1 tablet (10 mg total) by mouth at bedtime. 90 tablet 1   levocetirizine (XYZAL) 5 MG tablet Take 1 tablet (5 mg total) by mouth every evening.  90 tablet 3   pantoprazole (PROTONIX) 20 MG tablet Take 1 tablet (20 mg total) by mouth daily. 90 tablet 0   pravastatin (PRAVACHOL) 20 MG tablet Take 1 tablet (20 mg total) by mouth every evening. 90 tablet 3   rivaroxaban (XARELTO) 20 MG TABS tablet TAKE 1 TABLET BY MOUTH ONCE DAILY WITH EVENING MEAL 30 tablet 5   selenium 200 MCG TABS tablet Take by mouth.     SENNA PO Take by mouth.     sulfamethoxazole-trimethoprim (BACTRIM DS) 800-160 MG tablet Take 1 tablet by mouth 2 (two) times daily. 10 tablet 0   traMADol (ULTRAM) 50 MG tablet Take 1-2 tablets (50-100 mg total) by mouth 2 (two) times daily as needed. 120 tablet 5   triamcinolone cream (KENALOG) 0.1 % Apply topically 4 (four) times daily. 80 g 5   vitamin B-12 (CYANOCOBALAMIN) 100 MCG tablet Take 100 mcg by mouth daily.     No current facility-administered medications on file prior to visit.    ALLERGIES: Hydromorphone, Lopressor [metoprolol tartrate], Sacubitril-valsartan, Allegra allergy [fexofenadine hcl], Fexofenadine, Nsaids, Gabapentin, Gabapentin (once-daily), and Lipitor [atorvastatin]  Family History  Problem Relation Age of Onset   Colon polyps Mother    Hypertension Mother    Hyperlipidemia Mother    Diabetes type II Mother    Urticaria Mother    Heart attack Father    Hypertension Brother    Stomach cancer Maternal Grandfather    Breast cancer Cousin 26   Immunodeficiency Neg Hx    Eczema Neg Hx    Atopy Neg Hx    Asthma Neg Hx    Angioedema Neg Hx    Allergic rhinitis Neg Hx    Colon cancer Neg Hx    Esophageal cancer Neg Hx    Pancreatic cancer Neg Hx    Esophageal varices Neg Hx    Inflammatory bowel disease Neg Hx    Rectal cancer Neg Hx     SH:  married, non smoker  Review of Systems  All other systems reviewed and are negative.  PHYSICAL EXAMINATION:    BP 111/78 (BP Location: Left Arm, Patient Position: Sitting, Cuff Size: Normal)    Pulse 89    Ht 5\' 1"  (1.549 m) Comment: reported   Wt 126  lb 3.2 oz (57.2 kg)    LMP  (LMP Unknown) Comment: at age 23   BMI 23.85 kg/m     General appearance: alert, cooperative and appears stated age Lymph:  no inguinal LAD noted  Pelvic: External genitalia:  no lesions, area on left vulvar where excision was performed is healing well.  No erythema.  All sutures removed.                Urethra:  normal appearing urethra with no  masses, tenderness or lesions              Bartholins and Skenes: normal     Chaperone, Octaviano Batty, CMA, was present for exam.  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  If pt does not tolerate due to burning sensation will then need to consider co2 laser ablative therapy.

## 2021-11-05 ENCOUNTER — Telehealth: Payer: Self-pay

## 2021-11-05 NOTE — Telephone Encounter (Signed)
Patient called in stating that her monitor was making noises. I let patient know her ILR is rrt and she no longer needs the monitor. I let patient know she can unplug it and a return kit will be sent to her home. Patient has made a follow up appointment with WC to discuss replacing it

## 2021-11-25 ENCOUNTER — Encounter: Payer: Self-pay | Admitting: Family Medicine

## 2021-11-25 ENCOUNTER — Other Ambulatory Visit: Payer: Self-pay

## 2021-11-25 ENCOUNTER — Ambulatory Visit (INDEPENDENT_AMBULATORY_CARE_PROVIDER_SITE_OTHER): Payer: 59 | Admitting: Family Medicine

## 2021-11-25 VITALS — BP 97/61 | HR 93 | Temp 98.5°F | Ht 61.0 in | Wt 123.0 lb

## 2021-11-25 DIAGNOSIS — I63512 Cerebral infarction due to unspecified occlusion or stenosis of left middle cerebral artery: Secondary | ICD-10-CM

## 2021-11-25 DIAGNOSIS — I10 Essential (primary) hypertension: Secondary | ICD-10-CM

## 2021-11-25 DIAGNOSIS — M25561 Pain in right knee: Secondary | ICD-10-CM

## 2021-11-25 DIAGNOSIS — M25562 Pain in left knee: Secondary | ICD-10-CM

## 2021-11-25 DIAGNOSIS — I214 Non-ST elevation (NSTEMI) myocardial infarction: Secondary | ICD-10-CM | POA: Diagnosis not present

## 2021-11-25 DIAGNOSIS — Z951 Presence of aortocoronary bypass graft: Secondary | ICD-10-CM

## 2021-11-25 DIAGNOSIS — E785 Hyperlipidemia, unspecified: Secondary | ICD-10-CM | POA: Diagnosis not present

## 2021-11-25 DIAGNOSIS — I25810 Atherosclerosis of coronary artery bypass graft(s) without angina pectoris: Secondary | ICD-10-CM

## 2021-11-25 DIAGNOSIS — I482 Chronic atrial fibrillation, unspecified: Secondary | ICD-10-CM

## 2021-11-25 DIAGNOSIS — G8110 Spastic hemiplegia affecting unspecified side: Secondary | ICD-10-CM

## 2021-11-25 DIAGNOSIS — I700 Atherosclerosis of aorta: Secondary | ICD-10-CM

## 2021-11-25 DIAGNOSIS — G8929 Other chronic pain: Secondary | ICD-10-CM

## 2021-11-25 DIAGNOSIS — G5602 Carpal tunnel syndrome, left upper limb: Secondary | ICD-10-CM

## 2021-11-25 DIAGNOSIS — I714 Abdominal aortic aneurysm, without rupture, unspecified: Secondary | ICD-10-CM

## 2021-11-25 MED ORDER — TRAMADOL HCL 50 MG PO TABS
50.0000 mg | ORAL_TABLET | Freq: Two times a day (BID) | ORAL | 5 refills | Status: DC | PRN
Start: 2021-11-25 — End: 2022-05-06

## 2021-11-25 MED ORDER — LEVOCETIRIZINE DIHYDROCHLORIDE 5 MG PO TABS: 5.0000 mg | ORAL_TABLET | Freq: Every evening | ORAL | 3 refills | Status: DC

## 2021-11-25 MED ORDER — RIVAROXABAN 20 MG PO TABS: ORAL_TABLET | ORAL | 5 refills | Status: DC

## 2021-11-25 MED ORDER — HYDROXYZINE HCL 10 MG PO TABS: 10.0000 mg | ORAL_TABLET | Freq: Every day | ORAL | 1 refills | Status: DC

## 2021-11-25 MED ORDER — PRAVASTATIN SODIUM 20 MG PO TABS: 20.0000 mg | ORAL_TABLET | Freq: Every evening | ORAL | 3 refills | Status: DC

## 2021-11-25 MED ORDER — CARVEDILOL 3.125 MG PO TABS: ORAL_TABLET | ORAL | 1 refills | Status: DC

## 2021-11-25 NOTE — Progress Notes (Signed)
This visit occurred during the SARS-CoV-2 public health emergency.  Safety protocols were in place, including screening questions prior to the visit, additional usage of staff PPE, and extensive cleaning of exam room while observing appropriate contact time as indicated for disinfecting solutions.    Patient ID: [name], female  DOB: 09/23/1961, 61 y.o.   MRN: [id number] Patient Care Team    Relationship Specialty Notifications Start End  Natalia Leatherwood, DO PCP - General Family Medicine  10/13/17   Regan Lemming, MD PCP - Electrophysiology Cardiology Admissions 08/28/17   Jonelle Sidle, MD PCP - Cardiology Cardiology Admissions 12/15/17   Marvel Plan, MD Consulting Physician Neurology  10/13/17   Marcello Fennel, MD Consulting Physician Physical Medicine and Rehabilitation  10/13/17   Julieanne Cotton, MD Consulting Physician Interventional Radiology  10/13/17   Mansouraty, Netty Starring., MD Consulting Physician Gastroenterology  03/03/20   Darnell Level, MD Consulting Physician General Surgery  04/09/20   Crista Elliot, MD Consulting Physician Urology  04/09/20     Chief Complaint  Patient presents with   Hyperlipidemia    Cmc; pt is not fasting    Subjective: [name] is a 61 y.o.  Female  present for cmc. All past medical history, surgical history, allergies, family history, immunizations, medications and social history were updated in the electronic medical record today. All recent labs, ED visits and hospitalizations within the last year were reviewed.  H/o NSTEMI (non-ST elevated myocardial infarction) (HCC)/Coronary atherosclerosis of autologous artery bypass graft without angina/Benign essential HTN/Chronic diastolic CHF (congestive heart failure) (HCC)/Atrial fibrillation, chronic (HCC)/Atherosclerosis of aorta (HCC)/HLD Pt reports compliance with Coreg twice daily, statin and Xarelto. Blood pressures ranges at home are not routinely checked.  Patient  denies chest pain, shortness of breath, dizziness or lower extremity edema.  [name]   Anxiety Patient reports Vistaril nightly is helpful for sleep.   Left middle cerebral artery stroke (HCC)/Spastic hemiparesis affecting dominant side (HCC)/Cerebrovascular accident (CVA) due to occlusion of left middle cerebral artery [facility]) Patient following with physical/rehab.  She is no longer taking the baclofen.  She still uses the tramadol 50-100 mg before bed, and sometimes in the day if needed for pain.  Pain is secondary to her stroke and her arthritis.    Depression screen [facility] 2/9 11/25/2021 10/21/2021 09/29/2021 08/17/2021 07/13/2020  Decreased Interest 2 0 0 0 0  Down, Depressed, Hopeless 0 0 0 0 0  PHQ - 2 Score 2 0 0 0 0  Altered sleeping 2 - - - -  Tired, decreased energy 3 - - - -  Change in appetite 0 - - - -  Feeling bad or failure about yourself  0 - - - -  Trouble concentrating 0 - - - -  Moving slowly or fidgety/restless 0 - - - -  Suicidal thoughts 0 - - - -  PHQ-9 Score 7 - - - -  Some recent data might be hidden   GAD 7 : Generalized Anxiety Score 11/25/2021 10/22/2018 11/10/2017  Nervous, Anxious, on Edge 0 0 0  Control/stop worrying 0 0 0  Worry too much - different things 0 0 0  Trouble relaxing 0 0 0  Restless 0 0 0  Easily annoyed or irritable 0 0 0  Afraid - awful might happen 0 0 0  Total GAD 7 Score 0 0 0  Anxiety Difficulty - Not difficult at all Not difficult at all    Immunization History  Administered Date(s) Administered   Influenza,inj,Quad PF,6+ Mos 10/13/2017, 10/22/2018, 07/01/2019, 06/16/2020, 06/11/2021   Influenza,inj,quad, With Preservative 08/24/2017   Moderna SARS-COV2 Booster Vaccination 03/19/2021   Moderna Sars-Covid-2 Vaccination 01/10/2020, 02/11/2020   PNEUMOCOCCAL CONJUGATE-20 09/10/2021   Zoster Recombinat (Shingrix) 06/11/2021, 09/10/2021    Past Medical History:  Diagnosis Date   AAA (abdominal aortic aneurysm)    a. 3.3cm infrarenal by CT  02/2020.   Abnormal findings on endoscopic ultrasound (EUS) 01/25/2020   Abnormality present on pathology (Atypical Pathology) 01/25/2020   Allergy    Angio-edema    Angioedema    Arthritis    CAD (coronary artery disease)    a. s/p NSTEMI in 10/2017 with cath showing LM disease --> s/p CABG on 11/23/2017 with LIMA-LAD and SVG-OM.    Carotid stenosis    a. Duplex 11/2018 1-39% RICA, 40-59% LICA.   Closed nondisplaced fracture of greater trochanter of right femur (HCC) 04/09/2020   Cystocele, unspecified  03/03/2020   Gallbladder sludge and cholelithiasis without cholecystitis 01/25/2020   Small stones of the gallbladder appreciated on CT 02/2020 as well.   Gastritis and duodenitis 12/06/2019   11/2019 Peptic duodenitis  -  No dysplasia or malignancy identified  B. STOMACH, BIOPSY:  -  Mild chronic gastritis with intestinal metaplasia    Hay fever    History of kidney stones    Hyperlipidemia    Idiopathic recurrent acute pancreatitis 01/25/2020   Ischemic cardiomyopathy    a. EF 35-40% by echo in 10/2017. b. 55% by echo 01/2020.   Kidney stone 02/18/2020   Lytic bone lesion of femur    NSTEMI (non-ST elevated myocardial infarction) (HCC)    PAF (paroxysmal atrial fibrillation) (HCC)    Diagnosed by ILR   Pancreatitis    Pancreatitis, recurrent 11/07/2019   Pilonidal cyst 10/21/2019   Pneumonia 11/16/2017   Stroke (HCC) 08/23/2017   Left MCA infarct status post TPA and thrombectomy   Urticaria    Allergies  Allergen Reactions   Hydromorphone Other (See Comments)    Hallucinations Other reaction(s): Hallucination   Lopressor [Metoprolol Tartrate] Swelling    Angioedema   Sacubitril-Valsartan Swelling and Rash    Welts, possible angioedema  Rash   Allegra Allergy [Fexofenadine Hcl] Hives   Fexofenadine Hives   Nsaids Other (See Comments)    On Xarelto Not taking d/t abdominal pain   Gabapentin Rash   Gabapentin (Once-Daily) Rash   Lipitor [Atorvastatin] Diarrhea   Past  Surgical History:  Procedure Laterality Date   BIOPSY  12/02/2019   Procedure: BIOPSY;  Surgeon: Lemar Lofty., MD;  Location: [facility] ENDOSCOPY;  Service: Gastroenterology;;   CHOLECYSTECTOMY N/A 04/30/2020   Procedure: LAPAROSCOPIC CHOLECYSTECTOMY WITH INTRAOPERATIVE CHOLANGIOGRAM;  Surgeon: Darnell Level, MD;  Location: [facility] ORS;  Service: General;  Laterality: N/A;   CORONARY ARTERY BYPASS GRAFT N/A 11/23/2017   Procedure: CORONARY ARTERY BYPASS GRAFTING (CABG) x 2, ON PUMP, USING LEFT INTERNAL MAMMARY ARTERY AND RIGHT GREATER SAPHENOUS VEIN HARVESTED ENDOSCOPICALLY;  Surgeon: Alleen Borne, MD;  Location: [facility] OR;  Service: Open Heart Surgery;  Laterality: N/A;   CRYOABLATION     cervical   ESOPHAGOGASTRODUODENOSCOPY (EGD) WITH PROPOFOL N/A 12/02/2019   Procedure: ESOPHAGOGASTRODUODENOSCOPY (EGD) WITH PROPOFOL;  Surgeon: Meridee Score Netty Starring., MD;  Location[facility] ENDOSCOPY;  Service: Gastroenterology;  Laterality: N/A;   ESOPHAGOGASTRODUODENOSCOPY (EGD) WITH PROPOFOL N/A 03/19/2020   Procedure: ESOPHAGOGASTRODUODENOSCOPY (EGD) WITH PROPOFOL;  Surgeon: Rachael Fee, MD;  Location: [facility] ENDOSCOPY;  Service: Endoscopy;  Laterality: N/A;  EUS N/A 03/19/2020   Procedure: UPPER ENDOSCOPIC ULTRASOUND (EUS) RADIAL;  Surgeon: Rachael Fee, MD;  Location: [facility] ENDOSCOPY;  Service: Endoscopy;  Laterality: N/A;   EUS N/A 03/19/2020   Procedure: UPPER ENDOSCOPIC ULTRASOUND (EUS) LINEAR;  Surgeon: Rachael Fee, MD;  Location: [facility] ENDOSCOPY;  Service: Endoscopy;  Laterality: N/A;   FINE NEEDLE ASPIRATION  12/02/2019   Procedure: FINE NEEDLE ASPIRATION (FNA) LINEAR;  Surgeon: Lemar Lofty., MD;  Location: [facility] ENDOSCOPY;  Service: Gastroenterology;;   FINE NEEDLE ASPIRATION N/A 03/19/2020   Procedure: FINE NEEDLE ASPIRATION (FNA) LINEAR;  Surgeon: Rachael Fee, MD;  Location: [facility] ENDOSCOPY;  Service: Endoscopy;  Laterality: N/A;   IR ANGIO INTRA EXTRACRAN SEL COM CAROTID INNOMINATE UNI R MOD SED   08/21/2017   IR ANGIO VERTEBRAL SEL SUBCLAVIAN INNOMINATE UNI R MOD SED  08/21/2017   IR PERCUTANEOUS ART THROMBECTOMY/INFUSION INTRACRANIAL INC DIAG ANGIO  08/21/2017   IR RADIOLOGIST EVAL & MGMT  10/30/2017   LOOP RECORDER INSERTION N/A 08/28/2017   Procedure: LOOP RECORDER INSERTION;  Surgeon: Regan Lemming, MD;  Location: [facility] INVASIVE CV LAB;  Service: Cardiovascular;  Laterality: N/A;   RADIOLOGY WITH ANESTHESIA N/A 08/21/2017   Procedure: RADIOLOGY WITH ANESTHESIA;  Surgeon: Julieanne Cotton, MD;  Location: [facility] OR;  Service: Radiology;  Laterality: N/A;   RIGHT/LEFT HEART CATH AND CORONARY ANGIOGRAPHY N/A 11/20/2017   Procedure: RIGHT/LEFT HEART CATH AND CORONARY ANGIOGRAPHY;  Surgeon: Corky Crafts, MD;  Location: [facility] INVASIVE CV LAB;  Service: Cardiovascular;  Laterality: N/A;   TEE WITHOUT CARDIOVERSION N/A 08/28/2017   Procedure: TRANSESOPHAGEAL ECHOCARDIOGRAM (TEE);  Surgeon: Pricilla Riffle, MD;  Location[facility] ENDOSCOPY;  Service: Cardiovascular;  Laterality: N/A;   TEE WITHOUT CARDIOVERSION N/A 11/23/2017   Procedure: TRANSESOPHAGEAL ECHOCARDIOGRAM (TEE);  Surgeon: Alleen Borne, MD;  Location: [facility] OR;  Service: Open Heart Surgery;  Laterality: N/A;   TUBAL LIGATION     UPPER ESOPHAGEAL ENDOSCOPIC ULTRASOUND (EUS) N/A 12/02/2019   Procedure: UPPER ESOPHAGEAL ENDOSCOPIC ULTRASOUND (EUS);  Surgeon: Lemar Lofty., MD;  Location: [facility] ENDOSCOPY;  Service: Gastroenterology;  Laterality: N/A;   Family History  Problem Relation Age of Onset   Colon polyps Mother    Hypertension Mother    Hyperlipidemia Mother    Diabetes type II Mother    Urticaria Mother    Heart attack Father    Hypertension Brother    Stomach cancer Maternal Grandfather    Breast cancer Cousin 47   Immunodeficiency Neg Hx    Eczema Neg Hx    Atopy Neg Hx    Asthma Neg Hx    Angioedema Neg Hx    Allergic rhinitis Neg Hx    Colon cancer Neg Hx    Esophageal cancer Neg Hx    Pancreatic cancer Neg  Hx    Esophageal varices Neg Hx    Inflammatory bowel disease Neg Hx    Rectal cancer Neg Hx    Social History   Social History Narrative   Married. 2 children.    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    Walks with cane or wheelchair.    Former smoker.    Drinks caffeine.    Smoke alarm in the home.   Firearms in the home.    Wears her seatbelt.    Feels safe In her relationships.     Allergies as of 11/25/2021       Reactions   Hydromorphone Other (See Comments)   Hallucinations Other reaction(s): Hallucination  Lopressor [metoprolol Tartrate] Swelling   Angioedema   Sacubitril-valsartan Swelling, Rash   Welts, possible angioedema Rash   Allegra Allergy [fexofenadine Hcl] Hives   Fexofenadine Hives   Nsaids Other (See Comments)   On Xarelto Not taking d/t abdominal pain   Gabapentin Rash   Gabapentin (once-daily) Rash   Lipitor [atorvastatin] Diarrhea        Medication List        Accurate as of November 25, 2021 12:11 PM. If you have any questions, ask your nurse or doctor.          STOP taking these medications    baclofen 10 MG tablet Commonly known as: LIORESAL Stopped by: Felix Pacini, DO   diclofenac Sodium 1 % Gel Commonly known as: VOLTAREN Stopped by: Felix Pacini, DO   pantoprazole 20 MG tablet Commonly known as: PROTONIX Stopped by: Felix Pacini, DO   selenium 200 MCG Tabs tablet Stopped by: Felix Pacini, DO   SENNA PO Stopped by: Felix Pacini, DO   sulfamethoxazole-trimethoprim 800-160 MG tablet Commonly known as: BACTRIM DS Stopped by: Felix Pacini, DO   triamcinolone cream 0.1 % Commonly known as: KENALOG Stopped by: Felix Pacini, DO       TAKE these medications    calcium carbonate 500 MG chewable tablet Commonly known as: TUMS - dosed in mg elemental calcium Chew 1 tablet by mouth 2 (two) times daily as needed for indigestion or heartburn.   carvedilol 3.125 MG tablet Commonly known as: COREG 1/2 tab PO BID What  changed:  how much to take how to take this when to take this additional instructions Changed by: Felix Pacini, DO   cholecalciferol 1000 units tablet Commonly known as: VITAMIN D Take 1,000 Units by mouth daily.   hydrOXYzine 10 MG tablet Commonly known as: ATARAX Take 1 tablet (10 mg total) by mouth at bedtime.   levocetirizine 5 MG tablet Commonly known as: XYZAL Take 1 tablet (5 mg total) by mouth every evening.   pravastatin 20 MG tablet Commonly known as: PRAVACHOL Take 1 tablet (20 mg total) by mouth every evening.   rivaroxaban 20 MG Tabs tablet Commonly known as: Xarelto TAKE 1 TABLET BY MOUTH ONCE DAILY WITH EVENING MEAL   traMADol 50 MG tablet Commonly known as: ULTRAM Take 1-2 tablets (50-100 mg total) by mouth 2 (two) times daily as needed.   vitamin B-12 100 MCG tablet Commonly known as: CYANOCOBALAMIN Take 100 mcg by mouth daily.        All past medical history, surgical history, allergies, family history, immunizations andmedications were updated in the EMR today and reviewed under the history and medication portions of their EMR.        ROS: 14 pt review of systems performed and negative (unless mentioned in an HPI)  Objective: BP 97/61   Pulse 93   Temp 98.5 F (36.9 C) (Oral)   Ht 5\' 1"  (1.549 m)   Wt 123 lb (55.8 kg)   LMP  (LMP Unknown) Comment: at age 47  SpO2 95%   BMI 23.24 kg/m  Physical Exam Vitals and nursing note reviewed.  Constitutional:      General: She is not in acute distress.    Appearance: Normal appearance. She is not ill-appearing, toxic-appearing or diaphoretic.  HENT:     Head: Normocephalic and atraumatic.  Eyes:     General: No scleral icterus.       Right eye: No discharge.        Left  eye: No discharge.     Extraocular Movements: Extraocular movements intact.     Conjunctiva/sclera: Conjunctivae normal.     Pupils: Pupils are equal, round, and reactive to light.  Cardiovascular:     Rate and Rhythm:  Normal rate and regular rhythm.  Pulmonary:     Effort: Pulmonary effort is normal. No respiratory distress.     Breath sounds: Normal breath sounds. No wheezing, rhonchi or rales.  Musculoskeletal:     Cervical back: Neck supple. No tenderness.     Right lower leg: No edema.     Left lower leg: No edema.  Lymphadenopathy:     Cervical: No cervical adenopathy.  Skin:    General: Skin is warm and dry.     Coloration: Skin is not jaundiced or pale.     Findings: No erythema or rash.  Neurological:     Mental Status: She is alert and oriented to person, place, and time. Mental status is at baseline.     Motor: No weakness.     Gait: Gait normal.  Psychiatric:        Mood and Affect: Mood normal.        Behavior: Behavior normal.        Thought Content: Thought content normal.        Judgment: Judgment normal.    No results found.  Assessment/plan: OSA [name] is a 61 y.o. 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  Encouraged her to work on her hydration. Continue routine follow-ups with cardiology. Continue pravastatin 20 mg daily-unable to tolerate higher doses or other statins> managed by cardiology. Continue Coreg 3.125 mg at lower dose of a half a tab-twice daily Continue Xarelto 20 mg daily   Anxiety/allergies Stable Continue hydroxyzine 10 mg daily at bedtime Continue Xyzal daily-evening   Abdominal aortic aneurysm (AAA) without rupture (HCC) Has been stable, repeat imaging due 2024   Left middle cerebral artery stroke (HCC)/Spastic hemiparesis affecting dominant side (HCC)/Cerebrovascular accident (CVA) due to occlusion of left middle  cerebral artery (HCC) Stable Continue statin and chronic anticoagulation with Xarelto. Continue tramadol 50-100 mg twice daily as needed.  [place] controlled substance database reviewed and appropriate.    Return in about 24 weeks (around 05/12/2022) for [facility]MC (30 min).  No orders of the defined types were placed in this encounter.   No orders of the defined types were placed in this encounter.  Meds ordered this encounter  Medications   carvedilol (COREG) 3.125 MG tablet    Sig: 1/2 tab PO BID    Dispense:  90 tablet    Refill:  1   hydrOXYzine (ATARAX) 10 MG tablet    Sig: Take 1 tablet (10 mg total) by mouth at bedtime.    Dispense:  90 tablet    Refill:  1   levocetirizine (XYZAL) 5 MG tablet    Sig: Take 1 tablet (5 mg total) by mouth every evening.    Dispense:  90 tablet    Refill:  3   rivaroxaban (XARELTO) 20 MG TABS tablet    Sig: TAKE 1 TABLET BY MOUTH ONCE DAILY WITH EVENING MEAL    Dispense:  30 tablet    Refill:  5   pravastatin (PRAVACHOL) 20 MG tablet    Sig: Take 1  tablet (20 mg total) by mouth every evening.    Dispense:  90 tablet    Refill:  3    Pt needs follow up visit for further refills.   traMADol (ULTRAM) 50 MG tablet    Sig: Take 1-2 tablets (50-100 mg total) by mouth 2 (two) times daily as needed.    Dispense:  120 tablet    Refill:  5   Referral Orders  No referral(s) requested today      Electronically signed by: Felix Pacini, DO [facility] [place]

## 2021-12-02 ENCOUNTER — Other Ambulatory Visit: Payer: Self-pay | Admitting: Gastroenterology

## 2021-12-06 ENCOUNTER — Other Ambulatory Visit: Payer: Self-pay | Admitting: Gastroenterology

## 2021-12-09 ENCOUNTER — Encounter: Payer: 59 | Admitting: Physical Medicine & Rehabilitation

## 2021-12-10 ENCOUNTER — Other Ambulatory Visit: Payer: Self-pay

## 2021-12-10 MED ORDER — PANTOPRAZOLE SODIUM 20 MG PO TBEC: 20.0000 mg | DELAYED_RELEASE_TABLET | Freq: Every day | ORAL | 1 refills | Status: DC

## 2022-01-05 ENCOUNTER — Other Ambulatory Visit: Payer: Self-pay | Admitting: Physical Medicine & Rehabilitation

## 2022-01-10 ENCOUNTER — Other Ambulatory Visit: Payer: Self-pay | Admitting: Physical Medicine & Rehabilitation

## 2022-01-25 ENCOUNTER — Ambulatory Visit: Payer: 59 | Admitting: Internal Medicine

## 2022-01-25 ENCOUNTER — Encounter: Payer: Self-pay | Admitting: Internal Medicine

## 2022-01-25 VITALS — BP 128/70 | HR 100 | Ht 61.0 in | Wt 130.2 lb

## 2022-01-25 DIAGNOSIS — E063 Autoimmune thyroiditis: Secondary | ICD-10-CM

## 2022-01-25 LAB — TSH: TSH: 4.19 u[IU]/mL (ref 0.35–5.50)

## 2022-01-25 LAB — T4, FREE: Free T4: 0.69 ng/dL (ref 0.60–1.60)

## 2022-01-25 LAB — T3, FREE: T3, Free: 2.6 pg/mL (ref 2.3–4.2)

## 2022-01-25 NOTE — Progress Notes (Signed)
Patient ID: [name], female   DOB: 1961-06-09, 61 y.o.   MRN: [id number]  ? ?This visit occurred during the SARS-CoV-2 public health emergency.  Safety protocols were in place, including screening questions prior to the visit, additional usage of staff PPE, and extensive cleaning of exam room while observing appropriate contact time as indicated for disinfecting solutions.  ? ?HPI  ?[name] is a 61 y.o.-year-old female, initially referred by her PCP, Dr. Raoul Pitch, for management of Hashimoto's thyroiditis.  Last visit 1 year ago. ? ?Interim history: ?No hives since last visit. ?She continues to have knee pain (osteoarthritis) - had gel injections >> did not work. Also L carpal tunnel. She was R-handed after her stroke, now eating with the L hand.  Will need carpal tunnel surgery. She needs surgeries (wrist, knee replacements), but she would like to avoid these. ? ?Reviewed history: ?Patient has a history of Hashimoto's thyroiditis diagnosed in 08/2020 during investigation of her hives.  She did not require levothyroxine yet. ? ?Pt. also has a history of recurrent urticaria: abdomen, back, groin, hands (started in 2018) >> almost entirely resolved. ? ?Reviewed her TFTs: ?Lab Results  ?Component Value Date  ? TSH 4.02 06/11/2021  ? TSH 3.37 05/12/2021  ? TSH 4.67 (H) 01/19/2021  ? TSH 2.22 07/15/2020  ? TSH 4.58 (H) 01/14/2020  ? TSH 2.56 10/21/2019  ? TSH 1.98 02/02/2018  ? TSH 1.086 08/22/2017  ? FREET4 0.62 06/11/2021  ? FREET4 0.79 05/12/2021  ? FREET4 0.84 01/19/2021  ? FREET4 0.73 07/15/2020  ? FREET4 0.91 01/14/2020  ? FREET4 0.82 10/21/2019  ? FREET4 0.78 02/02/2018  ?  ?Her antithyroid antibodies were elevated: ?Component ?    Latest Ref Rng & Units 01/19/2021  ?Thyroperoxidase Ab SerPl-aCnc ?    <9 IU/mL 670 (H)  ?Thyroglobulin Ab ?    < or = 1 IU/mL 62 (H)  ? ?Component ?    Latest Ref Rng & Units 01/14/2020  ?Thyroperoxidase Ab SerPl-aCnc ?    <9 IU/mL 661 (H)  ?Thyroglobulin Ab ?    < or = 1  IU/mL 39 (H)  ? ?Component ?    Latest Ref Rng & Units 09/24/2019  ?Thyroperoxidase Ab SerPl-aCnc ?    0 - 34 IU/mL 349 (H)  ?Thyroglobulin Antibody ?    0.0 - 0.9 IU/mL 52.8 (H)  ? ?She is on selenium 200 mcg daily. ? ?Pt denies: ?- feeling nodules in neck ?- hoarseness ?- dysphagia ?- choking ?- SOB with lying down ? ?She has + FH of thyroid disorders in: M - postsurgical hypothyroidectomy.  No family history of thyroid cancer. ?No history of radiation to head or neck.  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 ? ?No recent use of iodine supplements.  No herbal supplements. ?She takes iron, vitamin B12 and vitamin D. ? ?Patient has a history of idiopathic recurrent pancreatitis and gastritis.  Between 2020 and 2021, she had 7 pancreatitis hospitalizations.  She had significant weight loss and extensive investigation for this without any cause found. ?In 11/2019, [name] was considered a possible culprit and it was stopped.  She also had cholecystectomy in 04/2020.  Unfortunately, she had a another episode of pancreatitis in 06/2020.  She was referred to [facility], where she had an ERCP in 07/2020.  No clear abnormalities were found, however, dual biliary and pancreatic sphincterotomies were performed.  However, she had no more pancreatitis episodes since then.  She now eats normally, solid foods. ?She also has a  history of ischemic cardiomyopathy, s/p AMI, PAF, history of stroke. She also has a history of angioedema (to Lopressor).   ?She had a clavicle fracture in 09/2020.  ?She has a h/o UTIs. ? ?ROS: ? + see HPI ? ?I reviewed pt's medications, allergies, PMH, social hx, family hx, and changes were documented in the history of present illness. Otherwise, unchanged from my initial visit note. ? ?Past Medical History:  ?Diagnosis Date  ? AAA (abdominal aortic aneurysm)   ? a. 3.3cm infrarenal by CT 02/2020.  ? Abnormal findings on endoscopic ultrasound (EUS) 01/25/2020  ? Abnormality present on pathology (Atypical Pathology)  01/25/2020  ? Allergy   ? Angio-edema   ? Angioedema   ? Arthritis   ? CAD (coronary artery disease)   ? a. s/p NSTEMI in 10/2017 with cath showing LM disease --> s/p CABG on 11/23/2017 with LIMA-LAD and SVG-OM.   ? Carotid stenosis   ? a. Duplex 11/2018 3-35% RICA, 45-62% LICA.  ? Closed nondisplaced fracture of greater trochanter of right femur ([place]) 04/09/2020  ? Cystocele, unspecified  03/03/2020  ? Gallbladder sludge and cholelithiasis without cholecystitis 01/25/2020  ? Small stones of the gallbladder appreciated on CT 02/2020 as well.  ? Gastritis and duodenitis 12/06/2019  ? 11/2019 Peptic duodenitis  -  No dysplasia or malignancy identified  B. STOMACH, BIOPSY:  -  Mild chronic gastritis with intestinal metaplasia   ? Gastroesophageal reflux disease 02/26/2021  ? Hay fever   ? History of kidney stones   ? Hyperlipidemia   ? Idiopathic recurrent acute pancreatitis 01/25/2020  ? Ischemic cardiomyopathy   ? a. EF 35-40% by echo in 10/2017. b. 55% by echo 01/2020.  ? Kidney stone 02/18/2020  ? Lytic bone lesion of femur   ? NSTEMI (non-ST elevated myocardial infarction) ([place])   ? PAF (paroxysmal atrial fibrillation) ([place])   ? Diagnosed by ILR  ? Pancreatitis   ? Pancreatitis, recurrent 11/07/2019  ? Pilonidal cyst 10/21/2019  ? Pneumonia 11/16/2017  ? 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 Stroke ([place]) 08/23/2017  ? Left MCA infarct status post TPA and thrombectomy  ? Urticaria   ? ?Past Surgical History:  ?Procedure Laterality Date  ? BIOPSY  12/02/2019  ? Procedure: BIOPSY;  Surgeon: Irving Copas., MD;  Location: [place];  Service: Gastroenterology;;  ? CHOLECYSTECTOMY N/A 04/30/2020  ? Procedure: LAPAROSCOPIC CHOLECYSTECTOMY WITH INTRAOPERATIVE CHOLANGIOGRAM;  Surgeon: Armandina Gemma, MD;  [facility];  Service: General;  Laterality: N/A;  ? CORONARY ARTERY BYPASS GRAFT N/A 11/23/2017  ? Procedure: CORONARY ARTERY BYPASS GRAFTING (CABG) x 2, ON PUMP, USING LEFT INTERNAL MAMMARY ARTERY AND RIGHT  GREATER SAPHENOUS VEIN HARVESTED ENDOSCOPICALLY;  Surgeon: Gaye Pollack, MD;  Location: [place];  Service: Open Heart Surgery;  Laterality: N/A;  ? CRYOABLATION    ? cervical  ? ESOPHAGOGASTRODUODENOSCOPY (EGD) WITH PROPOFOL N/A 12/02/2019  ? Procedure: ESOPHAGOGASTRODUODENOSCOPY (EGD) WITH PROPOFOL;  Surgeon: Rush Landmark Telford Nab., MD;  Location: [place];  Service: Gastroenterology;  Laterality: N/A;  ? ESOPHAGOGASTRODUODENOSCOPY (EGD) WITH PROPOFOL N/A 03/19/2020  ? Procedure: ESOPHAGOGASTRODUODENOSCOPY (EGD) WITH PROPOFOL;  Surgeon: Milus Banister, MD;  [facility];  Service: Endoscopy;  Laterality: N/A;  ? EUS N/A 03/19/2020  ? Procedure: UPPER ENDOSCOPIC ULTRASOUND (EUS) RADIAL;  Surgeon: Milus Banister, MD;  [facility];  Service: Endoscopy;  Laterality: N/A;  ? EUS N/A 03/19/2020  ? Procedure: UPPER ENDOSCOPIC ULTRASOUND (EUS) LINEAR;  Surgeon: Milus Banister, MD;  Location: [facility];  Service: Endoscopy;  Laterality: N/A;  ? FINE NEEDLE ASPIRATION  12/02/2019  ? Procedure: FINE NEEDLE ASPIRATION (FNA) LINEAR;  Surgeon: Irving Copas., MD;  Location: [place];  Service: Gastroenterology;;  ? FINE NEEDLE ASPIRATION N/A 03/19/2020  ? Procedure: FINE NEEDLE ASPIRATION (FNA) LINEAR;  Surgeon: Milus Banister, MD;  [facility];  Service: Endoscopy;  Laterality: N/A;  ? IR ANGIO INTRA EXTRACRAN SEL COM CAROTID INNOMINATE UNI R MOD SED  08/21/2017  ? IR ANGIO VERTEBRAL SEL SUBCLAVIAN INNOMINATE UNI R MOD SED  08/21/2017  ? IR PERCUTANEOUS ART THROMBECTOMY/INFUSION INTRACRANIAL INC DIAG ANGIO  08/21/2017  ? IR RADIOLOGIST EVAL & MGMT  10/30/2017  ? LOOP RECORDER INSERTION N/A 08/28/2017  ? Procedure: LOOP RECORDER INSERTION;  Surgeon: Constance Haw, MD;  Location: [place] [facility];  Service: Cardiovascular;  Laterality: N/A;  ? RADIOLOGY WITH ANESTHESIA N/A 08/21/2017  ? Procedure: RADIOLOGY WITH ANESTHESIA;  Surgeon: Luanne Bras, MD;  Location: [place];  Service: Radiology;  Laterality: N/A;  ? RIGHT/LEFT HEART CATH AND CORONARY ANGIOGRAPHY N/A 11/20/2017  ? Procedure: RIGHT/LEFT HEART CATH AND CORONARY ANGIOGRAPHY;  Surgeon: Jettie Booze, MD;  Niel Hummer

## 2022-01-25 NOTE — Patient Instructions (Addendum)
Please stop at the lab. ? ?You can stop Selenium when you run out. ? ?In case we need to start Levothyroxine, take the thyroid hormone every day, with water, at least 30 minutes before breakfast, separated by at least 4 hours from: ?- acid reflux medications ?- calcium ?- iron ?- multivitamins ? ?Please come back for a follow-up appointment in 1 year. ? ?

## 2022-02-17 ENCOUNTER — Ambulatory Visit: Payer: 59

## 2022-02-17 ENCOUNTER — Ambulatory Visit: Payer: 59 | Admitting: Cardiology

## 2022-02-17 ENCOUNTER — Encounter: Payer: Self-pay | Admitting: Cardiology

## 2022-02-17 VITALS — BP 96/58 | HR 103 | Ht 61.0 in | Wt 130.4 lb

## 2022-02-17 DIAGNOSIS — I480 Paroxysmal atrial fibrillation: Secondary | ICD-10-CM

## 2022-02-17 DIAGNOSIS — I493 Ventricular premature depolarization: Secondary | ICD-10-CM

## 2022-02-17 NOTE — Progress Notes (Signed)
Electrophysiology Office Note   Date:  02/17/2022   ID:  Kaitlyn, Stephenson 23-Dec-1960, MRN [id number]  PCP:  Natalia Leatherwood, DO  Cardiologist:  Diona Browner Primary Electrophysiologist:  Coleby Yett Jorja Loa, MD    No chief complaint on file.    History of Present Illness: [name] is a 61 y.o. female who is being seen today for the evaluation of atrial fibrillation at the request of [name]. Presenting today for electrophysiology evaluation.    She has a history significant for CVA, tobacco abuse, coronary artery disease, heart failure.  She is status post CABG x2 on 11/23/2017.  After her CVA, she had a Linq monitor implanted that showed atrial fibrillation.  She is currently on Xarelto.  Today, denies symptoms of palpitations, chest pain, shortness of breath, orthopnea, PND, lower extremity edema, claudication, dizziness, presyncope, syncope, bleeding, or neurologic sequela. The patient is tolerating medications without difficulties.  Since being seen she has done well.  She has no chest pain or shortness of breath.  She does note that she gets dizzy at times when she stands up.  She understands that this is likely due to orthostasis.  Otherwise, she has no major complaints at this time.   Past Medical History:  Diagnosis Date   AAA (abdominal aortic aneurysm) (HCC)    a. 3.3cm infrarenal by CT 02/2020.   Abnormal findings on endoscopic ultrasound (EUS) 01/25/2020   Abnormality present on pathology (Atypical Pathology) 01/25/2020   Allergy    Angio-edema    Angioedema    Arthritis    CAD (coronary artery disease)    a. s/p NSTEMI in 10/2017 with cath showing LM disease --> s/p CABG on 11/23/2017 with LIMA-LAD and SVG-OM.    Carotid stenosis    a. Duplex 11/2018 1-39% RICA, 40-59% LICA.   Closed nondisplaced fracture of greater trochanter of right femur (H[facility]) 04/09/2020   Cystocele, unspecified  03/03/2020   Gallbladder sludge and cholelithiasis without  cholecystitis 01/25/2020   Small stones of the gallbladder appreciated on CT 02/2020 as well.   Gastritis and duodenitis 12/06/2019   11/2019 Peptic duodenitis  -  No dysplasia or malignancy identified  B. STOMACH, BIOPSY:  -  Mild chronic gastritis with intestinal metaplasia    Gastroesophageal reflux disease 02/26/2021   Hay fever    History of kidney stones    Hyperlipidemia    Idiopathic recurrent acute pancreatitis 01/25/2020   Ischemic cardiomyopathy    a. EF 35-40% by echo in 10/2017. b. 55% by echo 01/2020.   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   Stroke (H[facility]) 08/23/2017   Left MCA infarct status post TPA and thrombectomy   Urticaria    Past Surgical History:  Procedure Laterality Date   BIOPSY  12/02/2019   Procedure: BIOPSY;  Surgeon: Lemar Lofty., MD;  Location: [facility];  Service: Gastroenterology;;   CHOLECYSTECTOMY N/A 04/30/2020   Procedure: LAPAROSCOPIC CHOLECYSTECTOMY WITH INTRAOPERATIVE CHOLANGIOGRAM;  Surgeon: Darnell Level, MD;  [facility];  Service: General;  Laterality: N/A;   CORONARY ARTERY BYPASS GRAFT N/A 11/23/2017   Procedure: CORONARY ARTERY BYPASS GRAFTING (CABG) x 2, ON PUMP, USING LEFT INTERNAL MAMMARY ARTERY AND  RIGHT GREATER SAPHENOUS VEIN HARVESTED ENDOSCOPICALLY;  Surgeon: Alleen Borne, MD;  Location[facility];  Service: Open Heart Surgery;  Laterality: N/A;   CRYOABLATION     cervical   ESOPHAGOGASTRODUODENOSCOPY (EGD) WITH PROPOFOL N/A 12/02/2019   Procedure: ESOPHAGOGASTRODUODENOSCOPY (EGD) WITH PROPOFOL;  Surgeon: Meridee Score Netty Starring., MD;  Location: [facility];  Service: Gastroenterology;  Laterality: N/A;   ESOPHAGOGASTRODUODENOSCOPY (EGD) WITH  PROPOFOL N/A 03/19/2020   Procedure: ESOPHAGOGASTRODUODENOSCOPY (EGD) WITH PROPOFOL;  Surgeon: Rachael Fee, MD;  [facility];  Service: Endoscopy;  Laterality: N/A;   EUS N/A 03/19/2020   Procedure: UPPER ENDOSCOPIC ULTRASOUND (EUS) RADIAL;  Surgeon: Rachael Fee, MD;  [facility];  Service: Endoscopy;  Laterality: N/A;   EUS N/A 03/19/2020   Procedure: UPPER ENDOSCOPIC ULTRASOUND (EUS) LINEAR;  Surgeon: Rachael Fee, MD;  [facility];  Service: Endoscopy;  Laterality: N/A;   FINE NEEDLE ASPIRATION  12/02/2019   Procedure: FINE NEEDLE ASPIRATION (FNA) LINEAR;  Surgeon: Lemar Lofty., MD;  Location: [facility] ENDOSCOPY;  Service: Gastroenterology;;   FINE NEEDLE ASPIRATION N/A 03/19/2020   Procedure: FINE NEEDLE ASPIRATION (FNA) LINEAR;  Surgeon: Rachael Fee, MD;  [facility];  Service: Endoscopy;  Laterality: N/A;   IR ANGIO INTRA EXTRACRAN SEL COM CAROTID INNOMINATE UNI R MOD SED  08/21/2017   IR ANGIO VERTEBRAL SEL SUBCLAVIAN INNOMINATE UNI R MOD SED  08/21/2017   IR PERCUTANEOUS ART THROMBECTOMY/INFUSION INTRACRANIAL INC DIAG ANGIO  08/21/2017   IR RADIOLOGIST EVAL & MGMT  10/30/2017   LOOP RECORDER INSERTION N/A 08/28/2017   Procedure: LOOP RECORDER INSERTION;  Surgeon: Regan Lemming, MD;  Location: [facility];  Service: Cardiovascular;  Laterality: N/A;   RADIOLOGY WITH ANESTHESIA N/A 08/21/2017   Procedure: RADIOLOGY WITH ANESTHESIA;  Surgeon: Julieanne Cotton, MD;  Location: [facility];  Service: Radiology;  Laterality: N/A;   RIGHT/LEFT HEART CATH AND CORONARY ANGIOGRAPHY N/A 11/20/2017   Procedure: RIGHT/LEFT HEART CATH AND CORONARY ANGIOGRAPHY;  Surgeon: Corky Crafts, MD;  [facility];  Service: Cardiovascular;  Laterality: N/A;   TEE WITHOUT CARDIOVERSION N/A 08/28/2017   Procedure: TRANSESOPHAGEAL ECHOCARDIOGRAM (TEE);  Surgeon: Pricilla Riffle, MD;  Location: [facility] ENDOSCOPY;  Service:  Cardiovascular;  Laterality: N/A;   TEE WITHOUT CARDIOVERSION N/A 11/23/2017   Procedure: TRANSESOPHAGEAL ECHOCARDIOGRAM (TEE);  Surgeon: Alleen Borne, MD;  Location: [facility] OR;  Service: Open Heart Surgery;  Laterality: N/A;   TUBAL LIGATION     UPPER ESOPHAGEAL ENDOSCOPIC ULTRASOUND (EUS) N/A 12/02/2019   Procedure: UPPER ESOPHAGEAL ENDOSCOPIC ULTRASOUND (EUS);  Surgeon: Lemar Lofty., MD;  Location: [facility] ENDOSCOPY;  Service: Gastroenterology;  Laterality: N/A;     Current Outpatient Medications  Medication Sig Dispense Refill   calcium carbonate (TUMS - DOSED IN MG ELEMENTAL CALCIUM) 500 MG chewable tablet Chew 1 tablet by mouth 2 (two) times daily as needed for indigestion or heartburn.      carvedilol (COREG) 3.125 MG tablet 1/2 tab PO BID 90 tablet 1   cholecalciferol (VITAMIN D) 1000 units tablet Take 1,000 Units by mouth daily.     diclofenac Sodium (VOLTAREN) 1 % GEL APPLY 2 GRAMS TOPICALLY 4 TIMES A DAY 300 g 0   hydrOXYzine (ATARAX) 10 MG tablet Take 1 tablet (10 mg total) by mouth at bedtime. 90 tablet 1   levocetirizine (XYZAL) 5 MG tablet Take 1 tablet (5 mg total) by mouth every evening. 90 tablet 3   pantoprazole (PROTONIX) 20 MG tablet  Take 1 tablet (20 mg total) by mouth daily. 90 tablet 1   pravastatin (PRAVACHOL) 20 MG tablet Take 1 tablet (20 mg total) by mouth every evening. 90 tablet 3   rivaroxaban (XARELTO) 20 MG TABS tablet TAKE 1 TABLET BY MOUTH ONCE DAILY WITH EVENING MEAL 30 tablet 5   traMADol (ULTRAM) 50 MG tablet Take 1-2 tablets (50-100 mg total) by mouth 2 (two) times daily as needed. 120 tablet 5   vitamin B-12 (CYANOCOBALAMIN) 100 MCG tablet Take 100 mcg by mouth daily.     No current facility-administered medications for this visit.    Allergies:   Hydromorphone, Lopressor [metoprolol tartrate], Sacubitril-valsartan, Allegra allergy [fexofenadine hcl], Fexofenadine, Nsaids, Gabapentin, Gabapentin (once-daily), and Lipitor [atorvastatin]   Social  History:  The patient  reports that she has been smoking cigarettes. She has a 3.70 pack-year smoking history. She has never used smokeless tobacco. She reports that she does not drink alcohol and does not use drugs.   Family History:  The patient's family history includes Breast cancer (age of onset: 83) in her cousin; Colon polyps in her mother; Diabetes type II in her mother; Heart attack in her father; Hyperlipidemia in her mother; Hypertension in her brother and mother; Stomach cancer in her maternal grandfather; Urticaria in her mother.   ROS:  Please see the history of present illness.   Otherwise, review of systems is positive for none.   All other systems are reviewed and negative.   PHYSICAL EXAM: VS:  BP (!) 96/58   Pulse (!) 103   Ht 5\' 1"  (1.549 m)   Wt 130 lb 6.4 oz (59.1 kg)   LMP  (LMP Unknown) Comment: at age 58  SpO2 97%   BMI 24.64 kg/m  , BMI Body mass index is 24.64 kg/m. GEN: Well nourished, well developed, in no acute distress  HEENT: normal  Neck: no JVD, carotid bruits, or masses Cardiac: irregular; no murmurs, rubs, or gallops,no edema  Respiratory:  clear to auscultation bilaterally, normal work of breathing GI: soft, nontender, nondistended, + BS MS: no deformity or atrophy  Skin: warm and dry, device site well healed Neuro:  Strength and sensation are intact Psych: euthymic mood, full affect  EKG:  EKG is ordered today. Personal review of the ekg ordered shows sinus rhythm, PVCs, rate 103  Personal review of the device interrogation today. Results in [facility]   Recent Labs: 06/11/2021: ALT 12; BUN 20; Creatinine, Ser 0.74; Hemoglobin 14.5; Magnesium 2.1; Platelets 328.0; Potassium 4.5; Sodium 138 01/25/2022: TSH 4.19    Lipid Panel     Component Value Date/Time   CHOL 240 (H) 06/11/2021 1020   TRIG 269.0 (H) 06/11/2021 1020   HDL 43.40 06/11/2021 1020   CHOLHDL 6 06/11/2021 1020   VLDL 53.8 (H) 06/11/2021 1020   LDLCALC 158 (H) 09/11/2019 0453    LDLDIRECT 155.0 06/11/2021 1020     Wt Readings from Last 3 Encounters:  02/17/22 130 lb 6.4 oz (59.1 kg)  01/25/22 130 lb 3.2 oz (59.1 kg)  11/25/21 123 lb (55.8 kg)      Other studies Reviewed: Additional studies/ records that were reviewed today include: TTE 08/01/2019 Review of the above records today demonstrates:   1. Left ventricular ejection fraction, by visual estimation, is 50 to 55%. The left ventricle has low normal function. There is no left ventricular hypertrophy.  2. Left ventricular diastolic parameters are consistent with Grade I diastolic dysfunction (impaired relaxation).  3. Hypokinesis/ akinesis of the basal  inferior/inferior walls.  4. Global right ventricle has normal systolic function.The right ventricular size is normal. No increase in right ventricular wall thickness.  5. Left atrial size was normal.  6. Right atrial size was normal.  7. The mitral valve is normal in structure. No evidence of mitral valve regurgitation.  8. The tricuspid valve is normal in structure. Tricuspid valve regurgitation is not demonstrated.  9. The aortic valve is tricuspid. Aortic valve regurgitation is not visualized. Mild to moderate aortic valve sclerosis/calcification without any evidence of aortic stenosis. 10. The pulmonic valve was not well visualized. Pulmonic valve regurgitation is not visualized.     ASSESSMENT AND PLAN:  1.  Coronary artery disease: Status post two-vessel bypass.  No current chest pain.  2.  Chronic systolic heart failure:  Currently feeling well without evidence of volume overload.  3.  Paroxysmal atrial fibrillation: Likely the cause of her CVA.  Currently on Xarelto.  CHA2DS2-VASc of 5.  Remains in sinus rhythm.  No changes at this time.  4.  PVCs: Elevated burden on her ECG today.  She is having PVCs with multiple different morphologies.  We Yecenia Dalgleish order a 7-day monitor for PVC burden.   Current medicines are reviewed at length with the patient  today.   The patient does not have concerns regarding her medicines.  The following changes were made today: None  Labs/ tests ordered today include:  Orders Placed This Encounter  Procedures   LONG TERM MONITOR (3-14 DAYS)   EKG 12-Lead     Disposition:   FU 12 months  Signed, Gentri Guardado Jorja Loa, MD  02/17/2022 4:28 PM     [facility] [address] [place] [address] [phone] (office) [phone] (fax)

## 2022-02-17 NOTE — Patient Instructions (Signed)
Medication Instructions:  Your physician recommends that you continue on your current medications as directed. Please refer to the Current Medication list given to you today.  *If you need a refill on your cardiac medications before your next appointment, please call your pharmacy*   Lab Work: None ordered If you have labs (blood work) drawn today and your tests are completely normal, you will receive your results only by: [place] (if you have MyChart) OR A paper copy in the mail If you have any lab test that is abnormal or we need to change your treatment, we will call you to review the results.   Testing/Procedures:                           Bryn Gulling- Long Term Monitor Instructions  Your physician has requested you wear a ZIO patch monitor for 7 days.  This is a single patch monitor. [employer] supplies one patch monitor per enrollment. Additional stickers are not available. Please do not apply patch if you will be having a Nuclear Stress Test,  Echocardiogram, Cardiac CT, MRI, or Chest Xray during the period you would be wearing the  monitor. The patch cannot be worn during these tests. You cannot remove and re-apply the  ZIO XT patch monitor.  Your ZIO patch monitor will be mailed 3 day USPS to your address on file. It may take 3-5 days  to receive your monitor after you have been enrolled.  Once you have received your monitor, please review the enclosed instructions. Your monitor  has already been registered assigning a specific monitor serial # to you.  Billing and Patient Assistance Program Information  We have supplied [employer] with any of your insurance information on file for billing purposes. [employer] offers a sliding scale Patient Assistance Program for patients that do not have  insurance, or whose insurance does not completely cover the cost of the ZIO monitor.  You must apply for the Patient Assistance Program to qualify for this discounted rate.  To apply, please  call [employer] at [phone], select option 4, select option 2, ask to apply for  Patient Assistance Program. Theodore Demark will ask your household income, and how many people  are in your household. They will quote your out-of-pocket cost based on that information.  [employer] will also be able to set up a 19-month interest-free payment plan if needed.  Applying the monitor   Shave hair from upper left chest.  Hold abrader disc by orange tab. Rub abrader in 40 strokes over the upper left chest as  indicated in your monitor instructions.  Clean area with 4 enclosed alcohol pads. Let dry.  Apply patch as indicated in monitor instructions. Patch will be placed under collarbone on left  side of chest with arrow pointing upward.  Rub patch adhesive wings for 2 minutes. Remove white label marked "1". Remove the white  label marked "2". Rub patch adhesive wings for 2 additional minutes.  While looking in a mirror, press and release button in center of patch. A small green light will  flash 3-4 times. This will be your only indicator that the monitor has been turned on.  Do not shower for the first 24 hours. You may shower after the first 24 hours.  Press the button if you feel a symptom. You will hear a small click. Record Date, Time and  Symptom in the Patient Logbook.  When you are ready to remove the  patch, follow instructions on the last 2 pages of Patient  Logbook. Stick patch monitor onto the last page of Patient Logbook.  Place Patient Logbook in the blue and white box. Use locking tab on box and tape box closed  securely. The blue and white box has prepaid postage on it. Please place it in the mailbox as  soon as possible. Your physician should have your test results approximately 7 days after the  monitor has been mailed back to [facility].  Call [facility] at [phone] if you have questions regarding  your ZIO XT patch monitor. Call them immediately if you see an  orange light blinking on your  monitor.  If your monitor falls off in less than 4 days, contact our Monitor department at [phone].  If your monitor becomes loose or falls off after 4 days call [employer] at [phone] for  suggestions on securing your monitor   Follow-Up: At [facility], you and your health needs are our priority.  As part of our continuing mission to provide you with exceptional heart care, we have created designated Provider Care Teams.  These Care Teams include your primary Cardiologist (physician) and Advanced Practice Providers (APPs -  Physician Assistants and Nurse Practitioners) who all work together to provide you with the care you need, when you need it.  We recommend signing up for the patient portal called "MyChart".  Sign up information is provided on this After Visit Summary.  MyChart is used to connect with patients for Virtual Visits (Telemedicine).  Patients are able to view lab/test results, encounter notes, upcoming appointments, etc.  Non-urgent messages can be sent to your provider as well.   To learn more about what you can do with MyChart, go to [email or web address].    Your next appointment:   1 year(s)  The format for your next appointment:   In Person  Provider:   You will see one of the following Advanced Practice Providers on your designated Care Team:   Tommye Standard, [place] Legrand Como "Jonni Sanger" Chalmers Cater, [place]    Thank you for choosing [facility]!!   Trinidad Curet, RN [phone]  Other Instructions    Important Information About Sugar

## 2022-02-17 NOTE — Progress Notes (Unsigned)
Enrolled patient for a 7 day Zio XT monitor to be mailed to patients home.  

## 2022-05-04 ENCOUNTER — Other Ambulatory Visit: Payer: Self-pay

## 2022-05-04 ENCOUNTER — Encounter (HOSPITAL_COMMUNITY): Payer: Self-pay | Admitting: Emergency Medicine

## 2022-05-04 ENCOUNTER — Telehealth: Payer: Self-pay | Admitting: Family Medicine

## 2022-05-04 ENCOUNTER — Ambulatory Visit (INDEPENDENT_AMBULATORY_CARE_PROVIDER_SITE_OTHER): Payer: 59

## 2022-05-04 ENCOUNTER — Inpatient Hospital Stay (HOSPITAL_COMMUNITY)
Admission: EM | Admit: 2022-05-04 | Discharge: 2022-05-06 | DRG: 439 | Disposition: A | Discharge status: Home / Self Care | Payer: 59 | Attending: Internal Medicine | Admitting: Internal Medicine

## 2022-05-04 ENCOUNTER — Encounter: Payer: Self-pay | Admitting: Family Medicine

## 2022-05-04 ENCOUNTER — Ambulatory Visit (INDEPENDENT_AMBULATORY_CARE_PROVIDER_SITE_OTHER): Payer: 59 | Admitting: Family Medicine

## 2022-05-04 VITALS — BP 115/62 | HR 112 | Temp 97.6°F | Ht 61.0 in | Wt 126.0 lb

## 2022-05-04 DIAGNOSIS — Z79899 Other long term (current) drug therapy: Secondary | ICD-10-CM

## 2022-05-04 DIAGNOSIS — Z803 Family history of malignant neoplasm of breast: Secondary | ICD-10-CM

## 2022-05-04 DIAGNOSIS — K8689 Other specified diseases of pancreas: Secondary | ICD-10-CM | POA: Diagnosis present

## 2022-05-04 DIAGNOSIS — D75839 Thrombocytosis, unspecified: Secondary | ICD-10-CM | POA: Diagnosis present

## 2022-05-04 DIAGNOSIS — I2510 Atherosclerotic heart disease of native coronary artery without angina pectoris: Secondary | ICD-10-CM | POA: Diagnosis present

## 2022-05-04 DIAGNOSIS — Z8719 Personal history of other diseases of the digestive system: Secondary | ICD-10-CM

## 2022-05-04 DIAGNOSIS — I480 Paroxysmal atrial fibrillation: Secondary | ICD-10-CM | POA: Diagnosis present

## 2022-05-04 DIAGNOSIS — I5032 Chronic diastolic (congestive) heart failure: Secondary | ICD-10-CM | POA: Diagnosis present

## 2022-05-04 DIAGNOSIS — R140 Abdominal distension (gaseous): Secondary | ICD-10-CM | POA: Diagnosis not present

## 2022-05-04 DIAGNOSIS — F17210 Nicotine dependence, cigarettes, uncomplicated: Secondary | ICD-10-CM | POA: Diagnosis present

## 2022-05-04 DIAGNOSIS — K56699 Other intestinal obstruction unspecified as to partial versus complete obstruction: Secondary | ICD-10-CM | POA: Diagnosis not present

## 2022-05-04 DIAGNOSIS — I7143 Infrarenal abdominal aortic aneurysm, without rupture: Secondary | ICD-10-CM | POA: Diagnosis not present

## 2022-05-04 DIAGNOSIS — Z833 Family history of diabetes mellitus: Secondary | ICD-10-CM

## 2022-05-04 DIAGNOSIS — E785 Hyperlipidemia, unspecified: Secondary | ICD-10-CM | POA: Diagnosis present

## 2022-05-04 DIAGNOSIS — R634 Abnormal weight loss: Secondary | ICD-10-CM | POA: Diagnosis present

## 2022-05-04 DIAGNOSIS — Z8673 Personal history of transient ischemic attack (TIA), and cerebral infarction without residual deficits: Secondary | ICD-10-CM

## 2022-05-04 DIAGNOSIS — I252 Old myocardial infarction: Secondary | ICD-10-CM

## 2022-05-04 DIAGNOSIS — Z800 Family history of malignant neoplasm of digestive organs: Secondary | ICD-10-CM

## 2022-05-04 DIAGNOSIS — K5900 Constipation, unspecified: Secondary | ICD-10-CM

## 2022-05-04 DIAGNOSIS — R109 Unspecified abdominal pain: Secondary | ICD-10-CM | POA: Diagnosis not present

## 2022-05-04 DIAGNOSIS — K859 Acute pancreatitis without necrosis or infection, unspecified: Principal | ICD-10-CM | POA: Diagnosis present

## 2022-05-04 DIAGNOSIS — Z7901 Long term (current) use of anticoagulants: Secondary | ICD-10-CM

## 2022-05-04 DIAGNOSIS — Z885 Allergy status to narcotic agent status: Secondary | ICD-10-CM

## 2022-05-04 DIAGNOSIS — I255 Ischemic cardiomyopathy: Secondary | ICD-10-CM | POA: Diagnosis present

## 2022-05-04 DIAGNOSIS — K219 Gastro-esophageal reflux disease without esophagitis: Secondary | ICD-10-CM | POA: Diagnosis present

## 2022-05-04 DIAGNOSIS — R1013 Epigastric pain: Secondary | ICD-10-CM | POA: Diagnosis not present

## 2022-05-04 DIAGNOSIS — D509 Iron deficiency anemia, unspecified: Secondary | ICD-10-CM | POA: Diagnosis present

## 2022-05-04 DIAGNOSIS — R9431 Abnormal electrocardiogram [ECG] [EKG]: Secondary | ICD-10-CM | POA: Diagnosis not present

## 2022-05-04 DIAGNOSIS — Z888 Allergy status to other drugs, medicaments and biological substances status: Secondary | ICD-10-CM

## 2022-05-04 DIAGNOSIS — I714 Abdominal aortic aneurysm, without rupture, unspecified: Secondary | ICD-10-CM | POA: Diagnosis present

## 2022-05-04 DIAGNOSIS — I10 Essential (primary) hypertension: Secondary | ICD-10-CM | POA: Diagnosis present

## 2022-05-04 DIAGNOSIS — Z8371 Family history of colonic polyps: Secondary | ICD-10-CM

## 2022-05-04 DIAGNOSIS — Z87442 Personal history of urinary calculi: Secondary | ICD-10-CM

## 2022-05-04 DIAGNOSIS — Z9049 Acquired absence of other specified parts of digestive tract: Secondary | ICD-10-CM

## 2022-05-04 DIAGNOSIS — K621 Rectal polyp: Secondary | ICD-10-CM | POA: Diagnosis present

## 2022-05-04 DIAGNOSIS — Z8249 Family history of ischemic heart disease and other diseases of the circulatory system: Secondary | ICD-10-CM

## 2022-05-04 DIAGNOSIS — K5730 Diverticulosis of large intestine without perforation or abscess without bleeding: Secondary | ICD-10-CM | POA: Diagnosis present

## 2022-05-04 DIAGNOSIS — G4700 Insomnia, unspecified: Secondary | ICD-10-CM | POA: Diagnosis present

## 2022-05-04 DIAGNOSIS — Z6823 Body mass index (BMI) 23.0-23.9, adult: Secondary | ICD-10-CM

## 2022-05-04 DIAGNOSIS — Z83438 Family history of other disorder of lipoprotein metabolism and other lipidemia: Secondary | ICD-10-CM

## 2022-05-04 DIAGNOSIS — I110 Hypertensive heart disease with heart failure: Secondary | ICD-10-CM | POA: Diagnosis present

## 2022-05-04 DIAGNOSIS — Z951 Presence of aortocoronary bypass graft: Secondary | ICD-10-CM

## 2022-05-04 DIAGNOSIS — Z955 Presence of coronary angioplasty implant and graft: Secondary | ICD-10-CM

## 2022-05-04 LAB — CBC WITH DIFFERENTIAL/PLATELET
Abs Immature Granulocytes: 0.06 10*3/uL (ref 0.00–0.07)
Basophils Absolute: 0 10*3/uL (ref 0.0–0.1)
Basophils Absolute: 0 10*3/uL (ref 0.0–0.1)
Basophils Relative: 0.3 % (ref 0.0–3.0)
Basophils Relative: 0 %
Eosinophils Absolute: 0.1 10*3/uL (ref 0.0–0.7)
Eosinophils Absolute: 0.1 10*3/uL (ref 0.0–0.5)
Eosinophils Relative: 0.5 % (ref 0.0–5.0)
Eosinophils Relative: 1 %
HCT: 35.5 % — ABNORMAL LOW (ref 36.0–46.0)
HCT: 34.2 % — ABNORMAL LOW (ref 36.0–46.0)
Hemoglobin: 11.1 g/dL — ABNORMAL LOW (ref 12.0–15.0)
Hemoglobin: 10.7 g/dL — ABNORMAL LOW (ref 12.0–15.0)
Immature Granulocytes: 1 %
Lymphocytes Relative: 17.6 % (ref 12.0–46.0)
Lymphocytes Relative: 25 %
Lymphs Abs: 2 10*3/uL (ref 0.7–4.0)
Lymphs Abs: 2.9 10*3/uL (ref 0.7–4.0)
MCH: 23.9 pg — ABNORMAL LOW (ref 26.0–34.0)
MCHC: 31.2 g/dL (ref 30.0–36.0)
MCHC: 31.3 g/dL (ref 30.0–36.0)
MCV: 75.5 fl — ABNORMAL LOW (ref 78.0–100.0)
MCV: 76.5 fL — ABNORMAL LOW (ref 80.0–100.0)
Monocytes Absolute: 1 10*3/uL (ref 0.1–1.0)
Monocytes Absolute: 1.1 10*3/uL — ABNORMAL HIGH (ref 0.1–1.0)
Monocytes Relative: 8.8 % (ref 3.0–12.0)
Monocytes Relative: 9 %
Neutro Abs: 8.1 10*3/uL — ABNORMAL HIGH (ref 1.4–7.7)
Neutro Abs: 7.5 10*3/uL (ref 1.7–7.7)
Neutrophils Relative %: 72.8 % (ref 43.0–77.0)
Neutrophils Relative %: 64 %
Platelets: 564 10*3/uL — ABNORMAL HIGH (ref 150.0–400.0)
Platelets: 540 10*3/uL — ABNORMAL HIGH (ref 150–400)
RBC: 4.7 Mil/uL (ref 3.87–5.11)
RBC: 4.47 MIL/uL (ref 3.87–5.11)
RDW: 16.7 % — ABNORMAL HIGH (ref 11.5–15.5)
RDW: 15.7 % — ABNORMAL HIGH (ref 11.5–15.5)
WBC: 11.2 10*3/uL — ABNORMAL HIGH (ref 4.0–10.5)
WBC: 11.7 10*3/uL — ABNORMAL HIGH (ref 4.0–10.5)
nRBC: 0 % (ref 0.0–0.2)

## 2022-05-04 LAB — COMPREHENSIVE METABOLIC PANEL
ALT: 11 U/L (ref 0–35)
ALT: 14 U/L (ref 0–44)
AST: 22 U/L (ref 0–37)
AST: 24 U/L (ref 15–41)
Albumin: 4.3 g/dL (ref 3.5–5.2)
Albumin: 3.7 g/dL (ref 3.5–5.0)
Alkaline Phosphatase: 83 U/L (ref 39–117)
Alkaline Phosphatase: 71 U/L (ref 38–126)
Anion gap: 11 (ref 5–15)
BUN: 14 mg/dL (ref 6–23)
BUN: 13 mg/dL (ref 8–23)
CO2: 22 mEq/L (ref 19–32)
CO2: 19 mmol/L — ABNORMAL LOW (ref 22–32)
Calcium: 9.6 mg/dL (ref 8.4–10.5)
Calcium: 9.3 mg/dL (ref 8.9–10.3)
Chloride: 101 mEq/L (ref 96–112)
Chloride: 102 mmol/L (ref 98–111)
Creatinine, Ser: 0.68 mg/dL (ref 0.40–1.20)
Creatinine, Ser: 0.73 mg/dL (ref 0.44–1.00)
GFR, Estimated: >60 mL/min (ref >60)
GFR: 93.6 mL/min (ref >60.00)
Glucose, Bld: 98 mg/dL (ref 70–99)
Glucose, Bld: 76 mg/dL (ref 70–99)
Potassium: 4.6 mEq/L (ref 3.5–5.1)
Potassium: 3.9 mmol/L (ref 3.5–5.1)
Sodium: 135 mEq/L (ref 135–145)
Sodium: 132 mmol/L — ABNORMAL LOW (ref 135–145)
Total Bilirubin: 0.3 mg/dL (ref 0.2–1.2)
Total Bilirubin: 0.6 mg/dL (ref 0.3–1.2)
Total Protein: 7.4 g/dL (ref 6.0–8.3)
Total Protein: 7.3 g/dL (ref 6.5–8.1)

## 2022-05-04 LAB — URINALYSIS, ROUTINE W REFLEX MICROSCOPIC
Bacteria, UA: NONE SEEN
Bilirubin Urine: NEGATIVE
Glucose, UA: NEGATIVE mg/dL
Ketones, ur: 5 mg/dL — AB
Leukocytes,Ua: NEGATIVE
Nitrite: NEGATIVE
Protein, ur: NEGATIVE mg/dL
Specific Gravity, Urine: 1.025 (ref 1.005–1.030)
pH: 6 (ref 5.0–8.0)

## 2022-05-04 LAB — TSH: TSH: 2.32 u[IU]/mL (ref 0.35–5.50)

## 2022-05-04 LAB — C-REACTIVE PROTEIN: CRP: 4.5 mg/dL (ref 0.5–20.0)

## 2022-05-04 LAB — SEDIMENTATION RATE: Sed Rate: 105 mm/hr — ABNORMAL HIGH (ref 0–30)

## 2022-05-04 LAB — LACTATE DEHYDROGENASE: LDH: 160 U/L (ref 120–250)

## 2022-05-04 LAB — LIPASE: Lipase: 2065 U/L — ABNORMAL HIGH (ref 11.0–59.0)

## 2022-05-04 LAB — LIPASE, BLOOD: Lipase: 614 U/L — ABNORMAL HIGH (ref 11–51)

## 2022-05-04 MED ORDER — IOHEXOL 9 MG/ML PO SOLN: 500.0000 mL | ORAL | Status: AC

## 2022-05-04 MED ORDER — POLYETHYLENE GLYCOL 3350 17 GM/SCOOP PO POWD: 17.0000 g | Freq: Two times a day (BID) | ORAL | 1 refills | Status: DC | PRN

## 2022-05-04 MED ORDER — SENNA-DOCUSATE SODIUM 8.6-50 MG PO TABS: 2.0000 | ORAL_TABLET | Freq: Every day | ORAL | 2 refills | Status: DC

## 2022-05-04 MED ORDER — IOHEXOL 300 MG/ML  SOLN
100.0000 mL | Freq: Once | INTRAMUSCULAR | Status: AC | PRN
  Administered 2022-05-04: 100 mL via INTRAVENOUS

## 2022-05-04 MED ORDER — OXYCODONE-ACETAMINOPHEN 5-325 MG PO TABS
1.0000 | ORAL_TABLET | Freq: Once | ORAL | Status: AC
  Administered 2022-05-04: 1 via ORAL
  Filled 2022-05-04: qty 1

## 2022-05-04 NOTE — ED Triage Notes (Signed)
Pt reports abdominal pain for 9 days.  Passing gas today but reports she has been constipated the past week.  Has hx of pancreatitis.

## 2022-05-04 NOTE — Progress Notes (Signed)
[name] , 1960-12-05, 61 y.o., female MRN: [id number] Patient Care Team    Relationship Specialty Notifications Start End  [name], DO PCP - General Family Medicine  10/13/17   Constance Haw, MD PCP - Electrophysiology Cardiology Admissions 08/28/17   Satira Sark, MD PCP - Cardiology Cardiology Admissions 12/15/17   Rosalin Hawking, MD Consulting Physician Neurology  10/13/17   Jamse Arn, MD Consulting Physician Physical Medicine and Rehabilitation  10/13/17   Luanne Bras, MD Consulting Physician Interventional Radiology  10/13/17   Mansouraty, Telford Nab., MD Consulting Physician Gastroenterology  03/03/20   Armandina Gemma, MD Consulting Physician General Surgery  04/09/20   Lucas Mallow, MD Consulting Physician Urology  04/09/20     Chief Complaint  Patient presents with   Constipation    Pt c/o constipation and bloating x 7 days     Subjective: Pt presents for an OV with complaints of constipation, abdominal pain and bloating of 7 days. She endorses fever, chill and nausea. In the last 48 hrs she has tried 2 fleets enemas, 1 senakot, 1 cap miralax and a glycerin suppository.  She reports the pain is becoming worse and woke her up multiple times through the night. She has been unable to eat for 1.5 days. She is trying to intake water. She has lost 4 lbs.  She endorses she did pass a little bit of gas this morning and when placing suppositories .     11/25/2021   10:38 AM 10/21/2021    2:17 PM 09/29/2021    2:33 PM 08/17/2021   12:40 PM 07/13/2020   11:04 AM  Depression screen PHQ 2/9  Decreased Interest 2 0 0 0 0  Down, Depressed, Hopeless 0 0 0 0 0  PHQ - 2 Score 2 0 0 0 0  Altered sleeping 2      Tired, decreased energy 3      Change in appetite 0      Feeling bad or failure about yourself  0      Trouble concentrating 0      Moving slowly or fidgety/restless 0      Suicidal thoughts 0      PHQ-9 Score 7        Allergies   Allergen Reactions   Hydromorphone Other (See Comments)    Hallucinations Other reaction(s): Hallucination   Lopressor [Metoprolol Tartrate] Swelling    Angioedema   Sacubitril-Valsartan Swelling and Rash    Welts, possible angioedema  Rash   Allegra Allergy [Fexofenadine Hcl] Hives   Fexofenadine Hives   Nsaids Other (See Comments)    On Xarelto Not taking d/t abdominal pain   Gabapentin Rash   Gabapentin (Once-Daily) Rash   Lipitor [Atorvastatin] Diarrhea   Social History   Social History Narrative   Married. 2 children.    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    Walks with cane or wheelchair.    Former smoker.    Drinks caffeine.    Smoke alarm in the home.   Firearms in the home.    Wears her seatbelt.    Feels safe In her relationships.    Past Medical History:  Diagnosis Date   AAA (abdominal aortic aneurysm) ([place])    a. 3.3cm infrarenal by CT 02/2020.   Abnormal findings on endoscopic ultrasound (EUS) 01/25/2020   Abnormality present on pathology (Atypical Pathology) 01/25/2020   Allergy    Angio-edema    Angioedema  Arthritis    CAD (coronary artery disease)    a. s/p NSTEMI in 10/2017 with cath showing LM disease --> s/p CABG on 11/23/2017 with LIMA-LAD and SVG-OM.    Carotid stenosis    a. Duplex 11/2018 6-81% RICA, 27-51% LICA.   Closed nondisplaced fracture of greater trochanter of right femur ([place]) 04/09/2020   Cystocele, unspecified  03/03/2020   Gallbladder sludge and cholelithiasis without cholecystitis 01/25/2020   Small stones of the gallbladder appreciated on CT 02/2020 as well.   Gastritis and duodenitis 12/06/2019   11/2019 Peptic duodenitis  -  No dysplasia or malignancy identified  B. STOMACH, BIOPSY:  -  Mild chronic gastritis with intestinal metaplasia    Gastroesophageal reflux disease 02/26/2021   Hay fever    History of kidney stones    Hyperlipidemia    Idiopathic recurrent acute pancreatitis 01/25/2020   Ischemic cardiomyopathy    a. EF  35-40% by echo in 10/2017. b. 55% by echo 01/2020.   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;  Service: Gastroenterology;;   CHOLECYSTECTOMY N/A 04/30/2020   Procedure: LAPAROSCOPIC CHOLECYSTECTOMY WITH INTRAOPERATIVE CHOLANGIOGRAM;  Surgeon: Armandina Gemma, MD;  Location: [facility];  Service: General;  Laterality: N/A;   CORONARY ARTERY BYPASS GRAFT N/A 11/23/2017   Procedure: CORONARY ARTERY BYPASS GRAFTING (CABG) x 2, ON PUMP, USING LEFT INTERNAL MAMMARY ARTERY AND RIGHT GREATER SAPHENOUS VEIN HARVESTED ENDOSCOPICALLY;  Surgeon: Gaye Pollack, MD;  Location: [facility];  Service: Open Heart Surgery;  Laterality: N/A;   CRYOABLATION     cervical   ESOPHAGOGASTRODUODENOSCOPY (EGD) WITH PROPOFOL N/A 12/02/2019   Procedure: ESOPHAGOGASTRODUODENOSCOPY (EGD) WITH PROPOFOL;  Surgeon: Rush Landmark Telford Nab., MD;  Location: [facility];  Service: Gastroenterology;  Laterality: N/A;   ESOPHAGOGASTRODUODENOSCOPY (EGD) WITH PROPOFOL N/A 03/19/2020   Procedure: ESOPHAGOGASTRODUODENOSCOPY (EGD) WITH PROPOFOL;  Surgeon: Milus Banister, MD;  [facility];  Service: Endoscopy;  Laterality: N/A;   EUS N/A 03/19/2020   Procedure: UPPER ENDOSCOPIC ULTRASOUND (EUS) RADIAL;  Surgeon: Milus Banister, MD;  [facility];  Service: Endoscopy;  Laterality: N/A;   EUS N/A 03/19/2020   Procedure: UPPER ENDOSCOPIC ULTRASOUND (EUS) LINEAR;  Surgeon: Milus Banister, MD;  [facility];  Service: Endoscopy;  Laterality: N/A;   FINE NEEDLE ASPIRATION  12/02/2019   Procedure: FINE NEEDLE ASPIRATION (FNA) LINEAR;  Surgeon: Irving Copas., MD;  Location: [place];  Service: Gastroenterology;;   FINE NEEDLE ASPIRATION N/A 03/19/2020   Procedure: FINE NEEDLE ASPIRATION (FNA) LINEAR;  Surgeon: Milus Banister, MD;  [facility];  Service: Endoscopy;  Laterality: N/A;   IR ANGIO INTRA EXTRACRAN SEL COM CAROTID INNOMINATE UNI R MOD SED  08/21/2017   IR ANGIO VERTEBRAL SEL SUBCLAVIAN INNOMINATE UNI R MOD SED  08/21/2017   IR PERCUTANEOUS ART THROMBECTOMY/INFUSION INTRACRANIAL INC DIAG ANGIO  08/21/2017   IR RADIOLOGIST EVAL & MGMT  10/30/2017   LOOP RECORDER INSERTION  N/A 08/28/2017   Procedure: LOOP RECORDER INSERTION;  Surgeon: Constance Haw, MD;  Location: [place] [facility];  Service: Cardiovascular;  Laterality: N/A;   RADIOLOGY WITH ANESTHESIA N/A 08/21/2017   Procedure: RADIOLOGY WITH ANESTHESIA;  Surgeon: Luanne Bras, MD;  Location: [place];  Service: Radiology;  Laterality: N/A;   RIGHT/LEFT HEART CATH AND CORONARY ANGIOGRAPHY N/A 11/20/2017   Procedure: RIGHT/LEFT HEART CATH AND CORONARY ANGIOGRAPHY;  Surgeon: Jettie Booze, MD;  Location: [place] [facility];  Service: Cardiovascular;  Laterality: N/A;   TEE WITHOUT CARDIOVERSION N/A 08/28/2017   Procedure: TRANSESOPHAGEAL ECHOCARDIOGRAM (TEE);  Surgeon: Fay Records, MD;  Location: [facility];  Service: Cardiovascular;  Laterality: N/A;   TEE WITHOUT CARDIOVERSION N/A 11/23/2017   Procedure: TRANSESOPHAGEAL ECHOCARDIOGRAM (TEE);  Surgeon: Gaye Pollack, MD;  Location: [place];  Service: Open Heart Surgery;  Laterality: N/A;   TUBAL LIGATION     UPPER ESOPHAGEAL ENDOSCOPIC ULTRASOUND (EUS) N/A 12/02/2019   Procedure: UPPER ESOPHAGEAL ENDOSCOPIC ULTRASOUND (EUS);  Surgeon: Irving Copas., MD;  Location: [place];  Service: Gastroenterology;  Laterality: N/A;   Family  History  Problem Relation Age of Onset   Colon polyps Mother    Hypertension Mother    Hyperlipidemia Mother    Diabetes type II Mother    Urticaria Mother    Heart attack Father    Hypertension Brother    Stomach cancer Maternal Grandfather    Breast cancer Cousin 83   Immunodeficiency Neg Hx    Eczema Neg Hx    Atopy Neg Hx    Asthma Neg Hx    Angioedema Neg Hx    Allergic rhinitis Neg Hx    Colon cancer Neg Hx    Esophageal cancer Neg Hx    Pancreatic cancer Neg Hx    Esophageal varices Neg Hx    Inflammatory bowel disease Neg Hx    Rectal cancer Neg Hx    Allergies as of 05/04/2022       Reactions   Hydromorphone Other (See Comments)   Hallucinations Other reaction(s): Hallucination   Lopressor [metoprolol Tartrate] Swelling   Angioedema   Sacubitril-valsartan Swelling, Rash   Welts, possible angioedema Rash   Allegra Allergy [fexofenadine Hcl] Hives   Fexofenadine Hives   Nsaids Other (See Comments)   On Xarelto Not taking d/t abdominal pain   Gabapentin Rash   Gabapentin (once-daily) Rash   Lipitor [atorvastatin] Diarrhea        Medication List        Accurate as of May 04, 2022 10:47 AM. If you have any questions, ask your nurse or doctor.          calcium carbonate 500 MG chewable tablet Commonly known as: TUMS - dosed in mg elemental calcium Chew 1 tablet by mouth 2 (two) times daily as needed for indigestion or heartburn.   carvedilol 3.125 MG tablet Commonly known as: COREG 1/2 tab PO BID   cholecalciferol 1000 units tablet Commonly known as: VITAMIN D Take 1,000 Units by mouth daily.   diclofenac Sodium 1 % Gel Commonly known as: VOLTAREN APPLY 2 GRAMS TOPICALLY 4 TIMES A DAY   hydrOXYzine 10 MG tablet Commonly known as: ATARAX Take 1 tablet (10 mg total) by mouth at bedtime.   levocetirizine 5 MG tablet Commonly known as: XYZAL Take 1 tablet (5 mg total) by mouth every evening.   pantoprazole 20 MG tablet Commonly known  as: PROTONIX Take 1 tablet (20 mg total) by  mouth daily.   polyethylene glycol powder 17 GM/SCOOP powder Commonly known as: GLYCOLAX/MIRALAX Take 17 g by mouth 2 (two) times daily as needed. Started by: Howard Pouch, DO   pravastatin 20 MG tablet Commonly known as: PRAVACHOL Take 1 tablet (20 mg total) by mouth every evening.   rivaroxaban 20 MG Tabs tablet Commonly known as: Xarelto TAKE 1 TABLET BY MOUTH ONCE DAILY WITH EVENING MEAL   sennosides-docusate sodium 8.6-50 MG tablet Commonly known as: SENOKOT-S Take 2 tablets by mouth daily. Started by: Howard Pouch, DO   traMADol 50 MG tablet Commonly known as: ULTRAM Take 1-2 tablets (50-100 mg total) by mouth 2 (two) times daily as needed.   vitamin B-12 100 MCG tablet Commonly known as: CYANOCOBALAMIN Take 100 mcg by mouth daily.        All past medical history, surgical history, allergies, family history, immunizations andmedications were updated in the EMR today and reviewed under the history and medication portions of their EMR.     Review of Systems  Constitutional:  Positive for chills, diaphoresis and fever. Negative for malaise/fatigue.  Gastrointestinal:  Positive for abdominal pain, constipation and nausea. Negative for blood in stool, diarrhea, melena and vomiting.  Musculoskeletal:  Positive for back pain.  Skin:  Negative for rash.   Negative, with the exception of above mentioned in HPI   Objective:  BP 115/62   Pulse (!) 112   Temp 97.6 F (36.4 C) (Oral)   Ht 5' 1"  (1.549 m)   Wt 126 lb (57.2 kg)   LMP  (LMP Unknown) Comment: at age 39  SpO2 98%   BMI 23.81 kg/m  Body mass index is 23.81 kg/m. Physical Exam Vitals and nursing note reviewed.  Constitutional:      General: She is not in acute distress.    Appearance: Normal appearance. She is normal weight. She is not ill-appearing or toxic-appearing.  HENT:     Mouth/Throat:     Mouth: Mucous membranes are dry.  Eyes:     Extraocular  Movements: Extraocular movements intact.     Conjunctiva/sclera: Conjunctivae normal.     Pupils: Pupils are equal, round, and reactive to light.  Cardiovascular:     Rate and Rhythm: Normal rate.  Pulmonary:     Effort: Pulmonary effort is normal.  Abdominal:     General: Bowel sounds are decreased. There is distension.     Palpations: Abdomen is rigid. There is no shifting dullness, hepatomegaly or splenomegaly.     Tenderness: There is abdominal tenderness in the epigastric area. There is guarding. There is no rebound. Negative signs include Murphy's sign and McBurney's sign.       Comments: Mod-severe pain TTP  Musculoskeletal:     Right lower leg: No edema.     Left lower leg: No edema.  Neurological:     Mental Status: She is alert and oriented to person, place, and time. Mental status is at baseline.  Psychiatric:        Mood and Affect: Mood normal.        Behavior: Behavior normal.        Thought Content: Thought content normal.        Judgment: Judgment normal.      No results found. No results found. No results found for this or any previous visit (from the past 24 hour(s)).  Assessment/Plan: LASHANDRA ARAUZ is a 61 y.o. female present for OV for  Epigastric pain/constipation/poss intestinal obstruction No BM for  7 days, concern for bowel obstruction/partial with her complaints pain waking her in the night and exam today with significant pain across upper abd/transverse colon w/ palpitation. She has failed oral and rectal OTC meds for constipation and now unable to eat.  - Lactate dehydrogenase - Comp Met (CMET) - CBC w/Diff - C-reactive protein - TSH - Lipase - Sedimentation rate - increase senakot to 2 tabs daily Miralax 2 caps daily.  Follow up dependent upon results.  Pt is aware if she has a blockage she will need to report to ED immediately.     Reviewed expectations re: course of current medical issues. Discussed self-management of  symptoms. Outlined signs and symptoms indicating need for more acute intervention. Patient verbalized understanding and all questions were answered. Patient received an After-Visit Summary.    Orders Placed This Encounter  Procedures   CT Abdomen Pelvis W Contrast   Lactate dehydrogenase   Comp Met (CMET)   CBC w/Diff   C-reactive protein   TSH   Lipase   Sedimentation rate   Meds ordered this encounter  Medications   sennosides-docusate sodium (SENOKOT-S) 8.6-50 MG tablet    Sig: Take 2 tablets by mouth daily.    Dispense:  60 tablet    Refill:  2   polyethylene glycol powder (GLYCOLAX/MIRALAX) 17 GM/SCOOP powder    Sig: Take 17 g by mouth 2 (two) times daily as needed.    Dispense:  3350 g    Refill:  1   Referral Orders  No referral(s) requested today    45 minutes spent providing urgent care with stat labs and image ordered  Note is dictated utilizing voice recognition software. Although note has been proof read prior to signing, occasional typographical errors still can be missed. If any questions arise, please do not hesitate to call for verification.   electronically signed by:  Howard Pouch, DO  [place]

## 2022-05-04 NOTE — Patient Instructions (Signed)
Liquid diet. Keep hydrate with water and electrolyte replacement drinks Miralax 1 cap every 12 hours Senakot 2 tabs a day  If pain worsens, blood in stool, vomiting, or you stop passing gas you need to go to ed immediately.   We will call you  will labs and CT result.   Constipation, Adult Constipation is when a person has fewer than three bowel movements in a week, has difficulty having a bowel movement, or has stools (feces) that are dry, hard, or larger than normal. Constipation may be caused by an underlying condition. It may become worse with age if a person takes certain medicines and does not take in enough fluids. Follow these instructions at home: Eating and drinking  Eat foods that have a lot of fiber, such as beans, whole grains, and fresh fruits and vegetables. Limit foods that are low in fiber and high in fat and processed sugars, such as fried or sweet foods. These include french fries, hamburgers, cookies, candies, and soda. Drink enough fluid to keep your urine pale yellow. General instructions Exercise regularly or as told by your health care provider. Try to do 150 minutes of moderate exercise each week. Use the bathroom when you have the urge to go. Do not hold it in. Take over-the-counter and prescription medicines only as told by your health care provider. This includes any fiber supplements. During bowel movements: Practice deep breathing while relaxing the lower abdomen. Practice pelvic floor relaxation. Watch your condition for any changes. Let your health care provider know about them. Keep all follow-up visits as told by your health care provider. This is important. Contact a health care provider if: You have pain that gets worse. You have a fever. You do not have a bowel movement after 4 days. You vomit. You are not hungry or you lose weight. You are bleeding from the opening between the buttocks (anus). You have thin, pencil-like stools. Get help right  away if: You have a fever and your symptoms suddenly get worse. You leak stool or have blood in your stool. Your abdomen is bloated. You have severe pain in your abdomen. You feel dizzy or you faint. Summary Constipation is when a person has fewer than three bowel movements in a week, has difficulty having a bowel movement, or has stools (feces) that are dry, hard, or larger than normal. Eat foods that have a lot of fiber, such as beans, whole grains, and fresh fruits and vegetables. Drink enough fluid to keep your urine pale yellow. Take over-the-counter and prescription medicines only as told by your health care provider. This includes any fiber supplements. This information is not intended to replace advice given to you by your health care provider. Make sure you discuss any questions you have with your health care provider. Document Revised: 07/31/2019 Document Reviewed: 07/31/2019 Elsevier Patient Education  [place].

## 2022-05-04 NOTE — ED Provider Triage Note (Signed)
Emergency Medicine Provider Triage Evaluation Note  JOLEAH KOSAK , a 61 y.o. female  was evaluated in triage.  Pt complains of  Abdominal pain for the past 9 days.  Constipation for the past 7 days.  Reports she is now passing gas today.  Prior history of pancreatitis with a stent placement [facility].  Also endorsing some nausea but no vomiting.  Has not taken any pain medication aside from tramadol for pain control.  Review of Systems  Positive: Abdominal pain, nausea Negative: Fever, vomiting  Physical Exam  LMP  (LMP Unknown) Comment: at age 56 Gen:   Awake, no distress   Resp:  Normal effort  MSK:   Moves extremities without difficulty  Other:    Medical Decision Making  Medically screening exam initiated at 6:02 PM.  Appropriate orders placed.  Katana RASHAUNDA RAHL was informed that the remainder of the evaluation will be completed by another provider, this initial triage assessment does not replace that evaluation, and the importance of remaining in the ED until their evaluation is complete.     Janeece Fitting, PA-C 05/04/22 1806

## 2022-05-04 NOTE — Telephone Encounter (Signed)
Please call patient Her pancreatic enzyme is extremely high - over 2000. Inflammatory marker is extremely high at 105 Mild signs of infection and anemia CT has signs of pancreatitis again and possible partial bowel blockage  " Increased pancreatic ductal dilatation in the head and body of the pancreas.There is also noted mildly increased inflammatory changes around the pancreatic head suggesting acute pancreatitis."   I recommend she report to ED immediately for further evaluation .

## 2022-05-04 NOTE — Telephone Encounter (Signed)
Spoke with pt regarding labs and instructions.   

## 2022-05-05 ENCOUNTER — Other Ambulatory Visit: Payer: 59

## 2022-05-05 ENCOUNTER — Observation Stay (HOSPITAL_COMMUNITY): Payer: 59

## 2022-05-05 DIAGNOSIS — I110 Hypertensive heart disease with heart failure: Secondary | ICD-10-CM | POA: Diagnosis not present

## 2022-05-05 DIAGNOSIS — Z7901 Long term (current) use of anticoagulants: Secondary | ICD-10-CM

## 2022-05-05 DIAGNOSIS — K859 Acute pancreatitis without necrosis or infection, unspecified: Secondary | ICD-10-CM | POA: Diagnosis not present

## 2022-05-05 DIAGNOSIS — I714 Abdominal aortic aneurysm, without rupture, unspecified: Secondary | ICD-10-CM | POA: Diagnosis not present

## 2022-05-05 DIAGNOSIS — I10 Essential (primary) hypertension: Secondary | ICD-10-CM

## 2022-05-05 DIAGNOSIS — Z8371 Family history of colonic polyps: Secondary | ICD-10-CM | POA: Diagnosis not present

## 2022-05-05 DIAGNOSIS — Z8673 Personal history of transient ischemic attack (TIA), and cerebral infarction without residual deficits: Secondary | ICD-10-CM | POA: Diagnosis not present

## 2022-05-05 DIAGNOSIS — D509 Iron deficiency anemia, unspecified: Secondary | ICD-10-CM | POA: Diagnosis not present

## 2022-05-05 DIAGNOSIS — R69 Illness, unspecified: Secondary | ICD-10-CM | POA: Diagnosis not present

## 2022-05-05 DIAGNOSIS — I7143 Infrarenal abdominal aortic aneurysm, without rupture: Secondary | ICD-10-CM | POA: Diagnosis not present

## 2022-05-05 DIAGNOSIS — K5730 Diverticulosis of large intestine without perforation or abscess without bleeding: Secondary | ICD-10-CM | POA: Diagnosis not present

## 2022-05-05 DIAGNOSIS — K8689 Other specified diseases of pancreas: Secondary | ICD-10-CM | POA: Diagnosis not present

## 2022-05-05 DIAGNOSIS — K621 Rectal polyp: Secondary | ICD-10-CM | POA: Diagnosis not present

## 2022-05-05 DIAGNOSIS — Z800 Family history of malignant neoplasm of digestive organs: Secondary | ICD-10-CM | POA: Diagnosis not present

## 2022-05-05 DIAGNOSIS — Z833 Family history of diabetes mellitus: Secondary | ICD-10-CM | POA: Diagnosis not present

## 2022-05-05 DIAGNOSIS — K5900 Constipation, unspecified: Secondary | ICD-10-CM | POA: Diagnosis not present

## 2022-05-05 DIAGNOSIS — Z83438 Family history of other disorder of lipoprotein metabolism and other lipidemia: Secondary | ICD-10-CM | POA: Diagnosis not present

## 2022-05-05 DIAGNOSIS — I255 Ischemic cardiomyopathy: Secondary | ICD-10-CM | POA: Diagnosis not present

## 2022-05-05 DIAGNOSIS — D75839 Thrombocytosis, unspecified: Secondary | ICD-10-CM | POA: Diagnosis present

## 2022-05-05 DIAGNOSIS — E785 Hyperlipidemia, unspecified: Secondary | ICD-10-CM

## 2022-05-05 DIAGNOSIS — I5032 Chronic diastolic (congestive) heart failure: Secondary | ICD-10-CM | POA: Diagnosis not present

## 2022-05-05 DIAGNOSIS — I2510 Atherosclerotic heart disease of native coronary artery without angina pectoris: Secondary | ICD-10-CM | POA: Diagnosis not present

## 2022-05-05 DIAGNOSIS — Z8719 Personal history of other diseases of the digestive system: Secondary | ICD-10-CM | POA: Diagnosis not present

## 2022-05-05 DIAGNOSIS — I480 Paroxysmal atrial fibrillation: Secondary | ICD-10-CM | POA: Diagnosis not present

## 2022-05-05 DIAGNOSIS — R634 Abnormal weight loss: Secondary | ICD-10-CM | POA: Diagnosis not present

## 2022-05-05 DIAGNOSIS — Z8249 Family history of ischemic heart disease and other diseases of the circulatory system: Secondary | ICD-10-CM | POA: Diagnosis not present

## 2022-05-05 DIAGNOSIS — Z803 Family history of malignant neoplasm of breast: Secondary | ICD-10-CM | POA: Diagnosis not present

## 2022-05-05 DIAGNOSIS — F17210 Nicotine dependence, cigarettes, uncomplicated: Secondary | ICD-10-CM | POA: Diagnosis present

## 2022-05-05 LAB — HIV ANTIBODY (ROUTINE TESTING W REFLEX): HIV Screen 4th Generation wRfx: NONREACTIVE

## 2022-05-05 LAB — IRON AND TIBC
Iron: 20 ug/dL — ABNORMAL LOW (ref 28–170)
Saturation Ratios: 5 % — ABNORMAL LOW (ref 10.4–31.8)
TIBC: 368 ug/dL (ref 250–450)
UIBC: 348 ug/dL

## 2022-05-05 LAB — TRIGLYCERIDES: Triglycerides: 186 mg/dL — ABNORMAL HIGH (ref <150)

## 2022-05-05 LAB — FERRITIN: Ferritin: 14 ng/mL (ref 11–307)

## 2022-05-05 MED ORDER — ACETAMINOPHEN 325 MG PO TABS: 650.0000 mg | ORAL_TABLET | Freq: Four times a day (QID) | ORAL | Status: DC | PRN

## 2022-05-05 MED ORDER — CARVEDILOL 3.125 MG PO TABS
1.6250 mg | ORAL_TABLET | Freq: Two times a day (BID) | ORAL | Status: DC
  Administered 2022-05-05 – 2022-05-06 (×2): 1.5625 mg via ORAL
  Filled 2022-05-05 (×2): qty 1

## 2022-05-05 MED ORDER — ONDANSETRON HCL 4 MG/2ML IJ SOLN: 4.0000 mg | Freq: Four times a day (QID) | INTRAMUSCULAR | Status: DC | PRN

## 2022-05-05 MED ORDER — ALBUTEROL SULFATE (2.5 MG/3ML) 0.083% IN NEBU
2.5000 mg | INHALATION_SOLUTION | Freq: Four times a day (QID) | RESPIRATORY_TRACT | Status: DC | PRN
Start: 2022-05-05 — End: 2022-05-06

## 2022-05-05 MED ORDER — RIVAROXABAN 20 MG PO TABS
20.0000 mg | ORAL_TABLET | Freq: Every day | ORAL | Status: DC
  Administered 2022-05-05: 20 mg via ORAL
  Filled 2022-05-05: qty 1

## 2022-05-05 MED ORDER — ENOXAPARIN SODIUM 40 MG/0.4ML IJ SOSY: 40.0000 mg | PREFILLED_SYRINGE | INTRAMUSCULAR | Status: DC

## 2022-05-05 MED ORDER — ACETAMINOPHEN 650 MG RE SUPP: 650.0000 mg | Freq: Four times a day (QID) | RECTAL | Status: DC | PRN

## 2022-05-05 MED ORDER — FERUMOXYTOL INJECTION 510 MG/17 ML: 510.0000 mg | Freq: Once | INTRAVENOUS | Status: DC

## 2022-05-05 MED ORDER — SODIUM CHLORIDE 0.9% FLUSH
3.0000 mL | Freq: Two times a day (BID) | INTRAVENOUS | Status: DC
  Administered 2022-05-05 – 2022-05-06 (×3): 3 mL via INTRAVENOUS

## 2022-05-05 MED ORDER — LACTATED RINGERS IV SOLN: INTRAVENOUS | Status: DC

## 2022-05-05 MED ORDER — PANTOPRAZOLE SODIUM 20 MG PO TBEC
20.0000 mg | DELAYED_RELEASE_TABLET | Freq: Every day | ORAL | Status: DC
  Administered 2022-05-05 – 2022-05-06 (×2): 20 mg via ORAL
  Filled 2022-05-05 (×2): qty 1

## 2022-05-05 MED ORDER — PRAVASTATIN SODIUM 10 MG PO TABS
20.0000 mg | ORAL_TABLET | Freq: Every evening | ORAL | Status: DC
  Administered 2022-05-05: 20 mg via ORAL
  Filled 2022-05-05: qty 2

## 2022-05-05 MED ORDER — LACTATED RINGERS IV BOLUS
500.0000 mL | Freq: Once | INTRAVENOUS | Status: AC
  Administered 2022-05-05: 500 mL via INTRAVENOUS

## 2022-05-05 MED ORDER — GADOBUTROL 1 MMOL/ML IV SOLN
6.0000 mL | Freq: Once | INTRAVENOUS | Status: AC | PRN
  Administered 2022-05-05: 6 mL via INTRAVENOUS

## 2022-05-05 MED ORDER — HYDROXYZINE HCL 10 MG PO TABS
10.0000 mg | ORAL_TABLET | Freq: Every day | ORAL | Status: DC
Start: 2022-05-05 — End: 2022-05-06
  Administered 2022-05-05: 10 mg via ORAL
  Filled 2022-05-05: qty 1

## 2022-05-05 MED ORDER — OXYCODONE HCL 5 MG PO TABS
5.0000 mg | ORAL_TABLET | ORAL | Status: DC | PRN
  Administered 2022-05-05: 5 mg via ORAL
  Filled 2022-05-05: qty 1

## 2022-05-05 MED ORDER — POLYETHYLENE GLYCOL 3350 17 G PO PACK
17.0000 g | PACK | Freq: Every day | ORAL | Status: DC
  Administered 2022-05-05 – 2022-05-06 (×2): 17 g via ORAL
  Filled 2022-05-05 (×2): qty 1

## 2022-05-05 MED ORDER — SODIUM CHLORIDE 0.9 % IV SOLN
250.0000 mg | Freq: Every day | INTRAVENOUS | Status: DC
  Administered 2022-05-05: 250 mg via INTRAVENOUS
  Filled 2022-05-05 (×2): qty 20

## 2022-05-05 NOTE — ED Notes (Signed)
Attempted to give report over phone, as still no purple man has been assigned; am told by Suezanne Jacquet that Jonelle Sidle will be taking pt and that she is currently working on discharging a pt; this RN inquired if another nurse could take report, I am told that no one can else can take report at the moment; will re-attempt report at a later time

## 2022-05-05 NOTE — H&P (Addendum)
History and Physical    Patient: [name]:[id number] DOB: [birth year] DOA: 05/04/2022 DOS: the patient was seen and examined on 05/05/2022 PCP: [name], DO  Patient coming from: Home  Chief Complaint:  Chief Complaint  Patient presents with   Abdominal Pain   HPI: [name] is a 61 y.o. female with medical history significant of hyperlipidemia, CAD, CVA, PAF on, recurrent pancreatitis, infrarenal AAA, who presents with complaints of abdominal pain for the last week.  She has had pain across her abdomen, but most severe in the left lower quadrant.  She had not been able to have a bowel movement over the last week despite using enemas, suppositories, and stool softeners.  She noted associated symptoms of hot/cold flashes, nausea, insomnia,  decreased p.o. intake over the last 2-3 days, and weight loss of approximately 4 pounds.  Previously in 2021 patient reported having several episodes of recurrent pancreatitis for which ultimately she went to [facility] in 07/2020 where patient underwent ERCP.  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 Since that time she had been doing well up until here recently.  Patient smokes a couple cigarettes here and there and rarely drinks alcohol.  Denies having any significant blood in stool/urine, vomiting, or dysuria.  She did have a loose bowel since being here in the hospital.  Patient also reports that she had a colonoscopy sometime last year where she was found to have polyps which were removed and was recommended to have repeat colonoscopy in 3 years.  Patient had been seen by her primary care provider office yesterday morning where labs revealed lipase 2065.  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  On admission to the emergency department patient was admitted to be afebrile with tachycardia and all other vital signs maintained.  Labs yesterday evening significant for WBC 11.7, hemoglobin 10.7, platelets 540, sodium 132, and lipase 614.  Patient was given oxycodone and normal saline IV fluids.  Orders placed for MRCP.  Review of Systems: As mentioned in the history of present illness. All other systems reviewed and are negative. Past Medical History:  Diagnosis Date   AAA (abdominal aortic aneurysm) ([place])    a. 3.3cm infrarenal by CT 02/2020.   Abnormal findings on endoscopic ultrasound (EUS) 01/25/2020   Abnormality present on pathology (Atypical Pathology) 01/25/2020   Allergy    Angio-edema    Angioedema    Arthritis    CAD (coronary artery disease)    a. s/p NSTEMI in 10/2017 with cath showing LM disease --> s/p CABG on 11/23/2017 with LIMA-LAD and SVG-OM.    Carotid stenosis    a. Duplex 11/2018 2-35% RICA, 57-32% LICA.   Closed nondisplaced fracture of greater trochanter of right femur ([place]) 04/09/2020   Cystocele, unspecified  03/03/2020   Gallbladder sludge and cholelithiasis without cholecystitis 01/25/2020   Small stones of the gallbladder appreciated on CT 02/2020 as well.   Gastritis and duodenitis 12/06/2019   11/2019 Peptic duodenitis  -  No dysplasia or malignancy identified  B. STOMACH, BIOPSY:  -  Mild chronic gastritis with intestinal  metaplasia    Gastroesophageal reflux disease 02/26/2021   Hay fever    History of kidney stones    Hyperlipidemia    Idiopathic recurrent acute pancreatitis 01/25/2020   Ischemic cardiomyopathy    a. EF 35-40% by echo in 10/2017. b. 55% by echo 01/2020.   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   Stroke ([place]) 08/23/2017   Left MCA infarct status post TPA and thrombectomy   Urticaria    Past Surgical History:  Procedure Laterality Date   BIOPSY  12/02/2019   Procedure: BIOPSY;  Surgeon: Irving Copas., MD;  Location: [place] [facility];  Service: Gastroenterology;;   CHOLECYSTECTOMY N/A 04/30/2020   Procedure: LAPAROSCOPIC CHOLECYSTECTOMY WITH INTRAOPERATIVE CHOLANGIOGRAM;  Surgeon: Armandina Gemma, MD;  Location: [facility];  Service: General;  Laterality: N/A;   CORONARY ARTERY BYPASS GRAFT N/A 11/23/2017   Procedure: CORONARY ARTERY BYPASS GRAFTING (CABG) x 2, ON PUMP, USING LEFT INTERNAL MAMMARY ARTERY AND RIGHT GREATER SAPHENOUS VEIN HARVESTED ENDOSCOPICALLY;  Surgeon: Gaye Pollack, MD;  Location: [facility];  Service: Open Heart Surgery;  Laterality: N/A;   CRYOABLATION     cervical   ESOPHAGOGASTRODUODENOSCOPY (EGD) WITH PROPOFOL N/A 12/02/2019   Procedure: ESOPHAGOGASTRODUODENOSCOPY (EGD) WITH PROPOFOL;  Surgeon: Rush Landmark Telford Nab., MD;  Location: [place];  Service: Gastroenterology;  Laterality: N/A;   ESOPHAGOGASTRODUODENOSCOPY (EGD) WITH PROPOFOL N/A 03/19/2020   Procedure: ESOPHAGOGASTRODUODENOSCOPY (EGD) WITH PROPOFOL;  Surgeon: Milus Banister, MD;  [facility];  Service: Endoscopy;  Laterality: N/A;   EUS N/A 03/19/2020   Procedure: UPPER ENDOSCOPIC ULTRASOUND (EUS) RADIAL;  Surgeon: Milus Banister, MD;  [facility];  Service: Endoscopy;  Laterality: N/A;   EUS N/A 03/19/2020   Procedure: UPPER ENDOSCOPIC ULTRASOUND (EUS) LINEAR;  Surgeon: Milus Banister, MD;  [facility];  Service: Endoscopy;  Laterality: N/A;   FINE NEEDLE ASPIRATION  12/02/2019   Procedure: FINE NEEDLE  ASPIRATION (FNA) LINEAR;  Surgeon: Irving Copas., MD;  Location: [place] [place];  Service: Gastroenterology;;   FINE NEEDLE ASPIRATION N/A 03/19/2020   Procedure: FINE NEEDLE ASPIRATION (FNA) LINEAR;  Surgeon: Milus Banister, MD;  [facility];  Service: Endoscopy;  Laterality: N/A;   IR ANGIO INTRA EXTRACRAN SEL COM CAROTID INNOMINATE UNI R MOD SED  08/21/2017   IR ANGIO VERTEBRAL SEL SUBCLAVIAN INNOMINATE UNI R MOD SED  08/21/2017   IR PERCUTANEOUS ART THROMBECTOMY/INFUSION INTRACRANIAL INC DIAG ANGIO  08/21/2017   IR RADIOLOGIST EVAL & MGMT  10/30/2017   LOOP RECORDER INSERTION N/A 08/28/2017   Procedure: LOOP RECORDER INSERTION;  Surgeon: Constance Haw, MD;  Location: [facility];  Service: Cardiovascular;  Laterality: N/A;   RADIOLOGY WITH ANESTHESIA N/A 08/21/2017   Procedure: RADIOLOGY WITH ANESTHESIA;  Surgeon: Luanne Bras, MD;  Location: [facility];  Service: Radiology;  Laterality: N/A;   RIGHT/LEFT HEART CATH AND CORONARY ANGIOGRAPHY N/A 11/20/2017   Procedure: RIGHT/LEFT HEART CATH AND CORONARY ANGIOGRAPHY;  Surgeon: Jettie Booze, MD;  Location: [facility];  Service: Cardiovascular;  Laterality: N/A;   TEE WITHOUT CARDIOVERSION N/A 08/28/2017   Procedure: TRANSESOPHAGEAL ECHOCARDIOGRAM (TEE);  Surgeon: Fay Records, MD;  Location: [facility];  Service: Cardiovascular;  Laterality: N/A;   TEE WITHOUT CARDIOVERSION N/A 11/23/2017   Procedure: TRANSESOPHAGEAL ECHOCARDIOGRAM (TEE);  Surgeon: Gaye Pollack, MD;  Location: [facility];  Service: Open Heart Surgery;  Laterality: N/A;   TUBAL LIGATION     UPPER ESOPHAGEAL ENDOSCOPIC ULTRASOUND (EUS) N/A 12/02/2019   Procedure: UPPER ESOPHAGEAL ENDOSCOPIC ULTRASOUND (EUS);  Surgeon: Irving Copas., MD;  Location: [facility];  Service: Gastroenterology;  Laterality: N/A;   Social History:  reports that she has been smoking cigarettes. She has a 3.70 pack-year smoking history. She has never  used smokeless tobacco. She reports that she does not drink alcohol and does not use drugs.  Allergies  Allergen Reactions   Hydromorphone Other (See Comments)    Hallucinations Other reaction(s): Hallucination   Lopressor [Metoprolol Tartrate] Swelling    Angioedema   Sacubitril-Valsartan Swelling and Rash    Welts, possible angioedema  Rash   Allegra Allergy [Fexofenadine Hcl] Hives   Fexofenadine Hives   Nsaids Other (See Comments)    On Xarelto Not taking d/t abdominal pain   Gabapentin Rash   Gabapentin (Once-Daily) Rash   Lipitor [Atorvastatin] Diarrhea    Family History  Problem Relation Age of Onset   Colon polyps Mother    Hypertension Mother    Hyperlipidemia Mother    Diabetes type II Mother    Urticaria Mother    Heart attack Father    Hypertension Brother    Stomach cancer Maternal Grandfather    Breast cancer Cousin 76   Immunodeficiency Neg Hx    Eczema Neg Hx    Atopy Neg Hx    Asthma Neg Hx    Angioedema Neg Hx    Allergic rhinitis Neg Hx    Colon cancer Neg Hx    Esophageal cancer Neg Hx    Pancreatic cancer Neg Hx    Esophageal varices Neg Hx    Inflammatory bowel disease Neg Hx    Rectal cancer Neg Hx     Prior to Admission medications   Medication Sig Start Date End Date Taking? Authorizing Provider  calcium carbonate (TUMS - DOSED IN MG ELEMENTAL CALCIUM) 500 MG chewable tablet Chew 1 tablet by mouth 2 (two) times daily as needed for indigestion or heartburn.     [provider]  carvedilol (COREG) 3.125 MG tablet 1/2 tab PO BID 11/25/21   [name], [name], DO  cholecalciferol (VITAMIN D) 1000 units tablet Take 1,000 Units by mouth daily.    [provider]  diclofenac Sodium (VOLTAREN) 1 % GEL APPLY 2 GRAMS TOPICALLY 4 TIMES A DAY 01/10/22   Kirsteins, Luanna Salk, MD  hydrOXYzine (ATARAX) 10 MG tablet Take 1 tablet (10 mg total) by mouth at bedtime. 11/25/21   [name], [name], DO  levocetirizine (XYZAL) 5 MG tablet Take 1 tablet  (5 mg total) by mouth every evening. 11/25/21   [name], [name], DO  pantoprazole (PROTONIX) 20 MG tablet Take 1 tablet (20 mg total) by mouth daily. 12/10/21   [name], [name], DO  polyethylene glycol powder (GLYCOLAX/MIRALAX) 17 GM/SCOOP powder Take 17 g by mouth 2 (two) times daily as needed. 05/04/22   [name], [name], DO  pravastatin (PRAVACHOL) 20 MG tablet Take 1 tablet (20 mg total) by mouth every evening. 11/25/21   [name], [name], DO  rivaroxaban (XARELTO) 20 MG TABS tablet TAKE 1 TABLET BY MOUTH ONCE DAILY WITH EVENING MEAL 11/25/21   [name], [name], DO  sennosides-docusate sodium (SENOKOT-S) 8.6-50 MG tablet Take 2  tablets by mouth daily. 05/04/22   [name], [name], DO  traMADol (ULTRAM) 50 MG tablet Take 1-2 tablets (50-100 mg total) by mouth 2 (two) times daily as needed. 11/25/21   [name], [name], DO  vitamin B-12 (CYANOCOBALAMIN) 100 MCG tablet Take 100 mcg by mouth daily.    [provider]    Physical Exam: Vitals:   05/05/22 0306 05/05/22 0313 05/05/22 0654 05/05/22 0700  BP:  115/79 119/71 118/67  Pulse: 88 99 98 91  Resp: '18 17 17   '$ Temp:  97.8 F (36.6 C) 97.8 F (36.6 C)   TempSrc:  Oral Oral   SpO2: 97% 98% 96% 92%  Weight:      Height:        Constitutional: Older adult female who appears to be no acute distress Eyes: PERRL, lids and conjunctivae normal ENMT: Mucous membranes are dry Neck: normal, supple  Respiratory: clear to auscultation bilaterally, no wheezing, no crackles. Normal respiratory effort. No accessory muscle use.  Cardiovascular: Regular rate and rhythm, no murmurs / rubs / gallops. No extremity edema.   Abdomen: Tenderness palpation across the midline but most significant in the left lower quadrant. Musculoskeletal: no clubbing / cyanosis. No joint deformity upper and lower extremities. tone.  Skin: no rashes, lesions, ulcers. No induration Neurologic: CN 2-12 grossly intact. Sensation intact, DTR normal. Strength 5/5 in all 4.  Psychiatric:  Normal judgment and insight. Alert and oriented x 3. Normal mood.    Data Reviewed:  EKG reveals sinus rhythm at 85 bpm with episode of unsustained ventricular tachycardia.  Assessment and Plan: Pancreatitis Acute.  Patient presented with complaints of weeklong history of abdominal pain.  Recently seen at PCP office where lipase was noted to be 2014.  Patient underwent CT scan which the concern for pancreatitis with duct dilatation and possible pseudocyst.  Previously she had been doing well after having being seen at [facility] in 07/2020 where patient underwent ERCP.  Findings were found concerning for sphincter of Oddi dysfunction for which patient had dual biliary and pancreatic duct sphincterotomies with prophylactic stent was placed in the main pancreatic duct. -Admit to MedSurg bed -Monitor intake and output -Check triglyceride level -Follow-up MRCP -Lactated Ringer's at 150 mL/h -Oxycodone as needed for pain -Zofran as needed nausea and vomiting - [facility] consulted, we will follow-up for any further recommendations  Constipation Possibly resolved.  Patient reported that she had a loose bowel movement today, but previously had not had a bowel movement in approximately 1 week. -Continue to monitor intake and output  Paroxysmal atrial fibrillation on chronic anticoagulation Patient appears to be in sinus rhythm at this time.  She reports compliance with anticoagulation.   -Continue metoprolol and Xarelto  Essential hypertension -Continue home medication regimen  Microcytic hypochromic anemia Acute.  On admission hemoglobin 10.7 with low MCV and MCH, but previously had been within normal limits 12-14.  Findings suggest iron deficiency and she is on Xarelto.  Patient states that she had a colonoscopy approximately 1 year and that found polyps which were removed.  She reports and told that she was in need a repeat colonoscopy in 3 years -Add-on iron, TIBC, and ferritin -Continue to  monitor H&H  Thrombocytosis Acute.  Platelet count 540.  Suspect reactive in nature secondary to above. -Recheck CBC tomorrow  Infrarenal AAA Noted on CT of the abdomen pelvis measuring approximately 3.8 cm, but previously noted to be 3.3 cm in 2021. -Recommend outpatient surveillance with repeat ultrasound every  2 years  Hyperlipidemia -Continue statin  DVT prophylaxis: Xarelto Advance Care Planning:   Code Status: Full Code    Consults: None  Family Communication: None requested  Severity of Illness: The appropriate patient status for this patient is OBSERVATION. Observation status is judged to be reasonable and necessary in order to provide the required intensity of service to ensure the patient's safety. The patient's presenting symptoms, physical exam findings, and initial radiographic and laboratory data in the context of their medical condition is felt to place them at decreased risk for further clinical deterioration. Furthermore, it is anticipated that the patient will be medically stable for discharge from the hospital within 2 midnights of admission.   Author: Norval Morton, MD 05/05/2022 8:26 AM  For on call review [email or web address].

## 2022-05-05 NOTE — ED Notes (Signed)
PATIENT AND FAMILY MEMBER WAS FUSSING AND  CRUSING ABOUT THE WAIT TIME .WHILE WE EXPLAIN ABOUT THE WAIT TIME.SCURITY HAD TO STEP TO THE THE SIDE TO EXPLAIN AS WELL.TO PATIENT AND HUSBAND HE CALM DOWN .

## 2022-05-05 NOTE — ED Notes (Addendum)
Called [facility] requesting purple man (status time 20 minutes)

## 2022-05-05 NOTE — ED Notes (Signed)
Report given to Levada Dy, RN - Per Levada Dy, ready for pt to be transported to [facility]

## 2022-05-05 NOTE — ED Notes (Signed)
Called [facility] again requesting purple man

## 2022-05-05 NOTE — ED Notes (Signed)
ED TO INPATIENT HANDOFF REPORT  ED Nurse Name and Phone #: [phone]   S Name/Age/Gender [name] 61 y.o. female Room/Bed: 045C/045C  Code Status   Code Status: Full Code  Home/SNF/Other Home Patient oriented to: self, place, time, and situation Is this baseline? Yes   Triage Complete: Triage complete  Chief Complaint Pancreatitis [K85.90]  Triage Note Pt reports abdominal pain for 9 days.  Passing gas today but reports she has been constipated the past week.  Has hx of pancreatitis.    Allergies Allergies  Allergen Reactions   Hydromorphone Other (See Comments)    Hallucinations Other reaction(s): Hallucination   Lopressor [Metoprolol Tartrate] Swelling    Angioedema   Sacubitril-Valsartan Swelling and Rash    Welts, possible angioedema  Rash   Allegra Allergy [Fexofenadine Hcl] Hives   Fexofenadine Hives   Nsaids Other (See Comments)    On Xarelto Not taking d/t abdominal pain   Gabapentin Rash   Gabapentin (Once-Daily) Rash   Lipitor [Atorvastatin] Diarrhea    Level of Care/Admitting Diagnosis ED Disposition     ED Disposition  Admit   Condition  --   Comment  Hospital Area: [facility] [[id number]]  Level of Care: Telemetry Medical [104]  May admit patient to [facility] or [facility] if equivalent level of care is available:: No  Covid Evaluation: Asymptomatic - no recent exposure (last 10 days) testing not required  Diagnosis: Pancreatitis [[id number]]  Admitting Physician: Norval Morton [[id number]]  Attending Physician: Norval Morton [[id number]]  Certification:: I certify this patient will need inpatient services for at least 2 midnights  Estimated Length of Stay: 1          B Medical/Surgery History Past Medical History:  Diagnosis Date   AAA (abdominal aortic aneurysm) ([place])    a. 3.3cm infrarenal by CT 02/2020.   Abnormal findings on endoscopic ultrasound (EUS) 01/25/2020   Abnormality present on pathology (Atypical  Pathology) 01/25/2020   Allergy    Angio-edema    Angioedema    Arthritis    CAD (coronary artery disease)    a. s/p NSTEMI in 10/2017 with cath showing LM disease --> s/p CABG on 11/23/2017 with LIMA-LAD and SVG-OM.    Carotid stenosis    a. Duplex 11/2018 2-95% RICA, 28-41% LICA.   Closed nondisplaced fracture of greater trochanter of right femur ([place]) 04/09/2020   Cystocele, unspecified  03/03/2020   Gallbladder sludge and cholelithiasis without cholecystitis 01/25/2020   Small stones of the gallbladder appreciated on CT 02/2020 as well.   Gastritis and duodenitis 12/06/2019   11/2019 Peptic duodenitis  -  No dysplasia or malignancy identified  B. STOMACH, BIOPSY:  -  Mild chronic gastritis with intestinal metaplasia    Gastroesophageal reflux disease 02/26/2021   Hay fever    History of kidney stones    Hyperlipidemia    Idiopathic recurrent acute pancreatitis 01/25/2020   Ischemic cardiomyopathy    a. EF 35-40% by echo in 10/2017. b. 55% by echo 01/2020.   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   Stroke ([place]) 08/23/2017   Left MCA infarct status post TPA and thrombectomy   Urticaria  Past Surgical History:  Procedure Laterality Date   BIOPSY  12/02/2019   Procedure: BIOPSY;  Surgeon: Mansouraty, Telford Nab., MD;  Location: [facility];  Service: Gastroenterology;;   CHOLECYSTECTOMY N/A 04/30/2020   Procedure: LAPAROSCOPIC CHOLECYSTECTOMY WITH INTRAOPERATIVE CHOLANGIOGRAM;  Surgeon: Armandina Gemma, MD;  Location: [facility];  Service: General;  Laterality: N/A;   CORONARY ARTERY BYPASS GRAFT N/A 11/23/2017   Procedure: CORONARY ARTERY BYPASS GRAFTING (CABG) x 2, ON PUMP, USING LEFT INTERNAL MAMMARY ARTERY  AND RIGHT GREATER SAPHENOUS VEIN HARVESTED ENDOSCOPICALLY;  Surgeon: Gaye Pollack, MD;  Location: [facility];  Service: Open Heart Surgery;  Laterality: N/A;   CRYOABLATION     cervical   ESOPHAGOGASTRODUODENOSCOPY (EGD) WITH PROPOFOL N/A 12/02/2019   Procedure: ESOPHAGOGASTRODUODENOSCOPY (EGD) WITH PROPOFOL;  Surgeon: Rush Landmark Telford Nab., MD;  Location: [facility];  Service: Gastroenterology;  Laterality: N/A;   ESOPHAGOGASTRODUODENOSCOPY (EGD) WITH PROPOFOL N/A 03/19/2020   Procedure: ESOPHAGOGASTRODUODENOSCOPY (EGD) WITH PROPOFOL;  Surgeon: Milus Banister, MD;  [facility];  Service: Endoscopy;  Laterality: N/A;   EUS N/A 03/19/2020   Procedure: UPPER ENDOSCOPIC ULTRASOUND (EUS) RADIAL;  Surgeon: Milus Banister, MD;  [facility];  Service: Endoscopy;  Laterality: N/A;   EUS N/A 03/19/2020   Procedure: UPPER ENDOSCOPIC ULTRASOUND (EUS) LINEAR;  Surgeon: Milus Banister, MD;  [facility];  Service: Endoscopy;  Laterality: N/A;   FINE NEEDLE ASPIRATION  12/02/2019   Procedure: FINE NEEDLE ASPIRATION (FNA) LINEAR;  Surgeon: Irving Copas., MD;  Location: [facility];  Service: Gastroenterology;;   FINE NEEDLE ASPIRATION N/A 03/19/2020   Procedure: FINE NEEDLE ASPIRATION (FNA) LINEAR;  Surgeon: Milus Banister, MD;  [facility];  Service: Endoscopy;  Laterality: N/A;   IR ANGIO INTRA EXTRACRAN SEL COM CAROTID INNOMINATE UNI R MOD SED  08/21/2017   IR ANGIO VERTEBRAL SEL SUBCLAVIAN INNOMINATE UNI R MOD SED  08/21/2017   IR PERCUTANEOUS ART THROMBECTOMY/INFUSION INTRACRANIAL INC DIAG ANGIO  08/21/2017   IR RADIOLOGIST EVAL & MGMT  10/30/2017   LOOP RECORDER INSERTION N/A 08/28/2017   Procedure: LOOP RECORDER INSERTION;  Surgeon: Constance Haw, MD;  Location: [facility];  Service: Cardiovascular;  Laterality: N/A;   RADIOLOGY WITH ANESTHESIA N/A 08/21/2017   Procedure: RADIOLOGY WITH ANESTHESIA;  Surgeon: Luanne Bras, MD;   Location: [facility];  Service: Radiology;  Laterality: N/A;   RIGHT/LEFT HEART CATH AND CORONARY ANGIOGRAPHY N/A 11/20/2017   Procedure: RIGHT/LEFT HEART CATH AND CORONARY ANGIOGRAPHY;  Surgeon: Jettie Booze, MD;  Location: [facility];  Service: Cardiovascular;  Laterality: N/A;   TEE WITHOUT CARDIOVERSION N/A 08/28/2017   Procedure: TRANSESOPHAGEAL ECHOCARDIOGRAM (TEE);  Surgeon: Fay Records, MD;  Location: [facility];  Service: Cardiovascular;  Laterality: N/A;   TEE WITHOUT CARDIOVERSION N/A 11/23/2017   Procedure: TRANSESOPHAGEAL ECHOCARDIOGRAM (TEE);  Surgeon: Gaye Pollack, MD;  Location: [facility];  Service: Open Heart Surgery;  Laterality: N/A;   TUBAL LIGATION     UPPER ESOPHAGEAL ENDOSCOPIC ULTRASOUND (EUS) N/A 12/02/2019   Procedure: UPPER ESOPHAGEAL ENDOSCOPIC ULTRASOUND (EUS);  Surgeon: Irving Copas., MD;  Location: [facility];  Service: Gastroenterology;  Laterality: N/A;     A IV Location/Drains/Wounds Patient Lines/Drains/Airways Status     Active Line/Drains/Airways     Name Placement date Placement time Site Days   Peripheral IV 05/05/22 20 G Left Antecubital 05/05/22  0859  Antecubital  less than 1   Wound / Incision (Open or Dehisced) 11/29/19 Non-pressure wound;Other (Comment) Sacrum  Mid;Lower reddened, not open 11/29/19  1518  Sacrum  888            Intake/Output Last 24 hours No intake or output data in the 24 hours ending 05/05/22 1532  Labs/Imaging Results for orders placed or performed during the hospital encounter of 05/04/22 (from the past 48 hour(s))  Urinalysis, Routine w reflex microscopic Urine, Clean Catch     Status: Abnormal   Collection Time: 05/04/22  5:55 PM  Result Value Ref Range   Color, Urine STRAW (A) YELLOW   APPearance CLEAR CLEAR   Specific Gravity, Urine 1.025 1.005 - 1.030   pH 6.0 5.0 - 8.0   Glucose, UA NEGATIVE NEGATIVE mg/dL   Hgb urine dipstick SMALL (A) NEGATIVE   Bilirubin Urine NEGATIVE NEGATIVE    Ketones, ur 5 (A) NEGATIVE mg/dL   Protein, ur NEGATIVE NEGATIVE mg/dL   Nitrite NEGATIVE NEGATIVE   Leukocytes,Ua NEGATIVE NEGATIVE   RBC / HPF 0-5 0 - 5 RBC/hpf   WBC, UA 0-5 0 - 5 WBC/hpf   Bacteria, UA NONE SEEN NONE SEEN   Squamous Epithelial / LPF 0-5 0 - 5    Comment: Performed at [facility], [place] [address] [place], [place] [address]  CBC with Differential     Status: Abnormal   Collection Time: 05/04/22  6:05 PM  Result Value Ref Range   WBC 11.7 (H) 4.0 - 10.5 K/uL   RBC 4.47 3.87 - 5.11 MIL/uL   Hemoglobin 10.7 (L) 12.0 - 15.0 g/dL   HCT 34.2 (L) 36.0 - 46.0 %   MCV 76.5 (L) 80.0 - 100.0 fL   MCH 23.9 (L) 26.0 - 34.0 pg   MCHC 31.3 30.0 - 36.0 g/dL   RDW 15.7 (H) 11.5 - 15.5 %   Platelets 540 (H) 150 - 400 K/uL   nRBC 0.0 0.0 - 0.2 %   Neutrophils Relative % 64 %   Neutro Abs 7.5 1.7 - 7.7 K/uL   Lymphocytes Relative 25 %   Lymphs Abs 2.9 0.7 - 4.0 K/uL   Monocytes Relative 9 %   Monocytes Absolute 1.1 (H) 0.1 - 1.0 K/uL   Eosinophils Relative 1 %   Eosinophils Absolute 0.1 0.0 - 0.5 K/uL   Basophils Relative 0 %   Basophils Absolute 0.0 0.0 - 0.1 K/uL   Immature Granulocytes 1 %   Abs Immature Granulocytes 0.06 0.00 - 0.07 K/uL    Comment: Performed at [facility], [place] [address] [place], [place] [address]  Comprehensive metabolic panel     Status: Abnormal   Collection Time: 05/04/22  6:05 PM  Result Value Ref Range   Sodium 132 (L) 135 - 145 mmol/L   Potassium 3.9 3.5 - 5.1 mmol/L   Chloride 102 98 - 111 mmol/L   CO2 19 (L) 22 - 32 mmol/L   Glucose, Bld 76 70 - 99 mg/dL    Comment: Glucose reference range applies only to samples taken after fasting for at least 8 hours.   BUN 13 8 - 23 mg/dL   Creatinine, Ser 0.73 0.44 - 1.00 mg/dL   Calcium 9.3 8.9 - 10.3 mg/dL   Total Protein 7.3 6.5 - 8.1 g/dL   Albumin 3.7 3.5 - 5.0 g/dL   AST 24 15 - 41 U/L   ALT 14 0 - 44 U/L   Alkaline Phosphatase 71 38 - 126 U/L   Total Bilirubin 0.6 0.3 - 1.2  mg/dL   GFR, Estimated >  60 >60 mL/min    Comment: (NOTE) Calculated using the CKD-EPI Creatinine Equation (2021)    Anion gap 11 5 - 15    Comment: Performed at [facility], [place] [address] [place], [place] [address]  Lipase, blood     Status: Abnormal   Collection Time: 05/04/22  6:05 PM  Result Value Ref Range   Lipase 614 (H) 11 - 51 U/L    Comment: RESULTS CONFIRMED BY MANUAL DILUTION Performed at [facility], [place] [address], [place], [place] [address]   Triglycerides     Status: Abnormal   Collection Time: 05/05/22  8:35 AM  Result Value Ref Range   Triglycerides 186 (H) <150 mg/dL    Comment: Performed at [facility] [address] [place], [place] [address]  HIV Antibody (routine testing w rflx)     Status: None   Collection Time: 05/05/22  8:56 AM  Result Value Ref Range   HIV Screen 4th Generation wRfx Non Reactive Non Reactive    Comment: Performed at [facility], [place] [address], [place], [place] [address]  Iron and TIBC     Status: Abnormal   Collection Time: 05/05/22  8:56 AM  Result Value Ref Range   Iron 20 (L) 28 - 170 ug/dL   TIBC 368 250 - 450 ug/dL   Saturation Ratios 5 (L) 10.4 - 31.8 %   UIBC 348 ug/dL    Comment: Performed at [facility], [place] [address], [place], [place] [address]  Ferritin     Status: None   Collection Time: 05/05/22  8:56 AM  Result Value Ref Range   Ferritin 14 11 - 307 ng/mL    Comment: Performed at [facility], [place] [address], [place], [place] [address]   MR ABDOMEN MRCP W WO CONTAST  Result Date: 05/05/2022 CLINICAL DATA:  Idiopathic recurrent acute pancreatitis with previous stent placement. Increasing abdominal pain and constipation for 1 week. EXAM: MRI ABDOMEN WITHOUT AND WITH CONTRAST (INCLUDING MRCP) TECHNIQUE: Multiplanar multisequence MR imaging of the abdomen was performed both before and after the administration of intravenous contrast. Heavily T2-weighted images of the biliary  and pancreatic ducts were obtained, and three-dimensional MRCP images were rendered by post processing. CONTRAST:  68m GADAVIST GADOBUTROL 1 MMOL/ML IV SOLN COMPARISON:  Abdominal CT 05/04/2022 and 06/17/2020. Abdominal MRI 07/07/2020. FINDINGS: Despite efforts by the technologist and patient, moderate motion artifact is present on today's exam and could not be eliminated. This reduces exam sensitivity and specificity. Lower chest:  The visualized lower chest appears unremarkable. Hepatobiliary: The hepatic contours are normal. No signs of cirrhosis or significant steatosis. Stable probable tiny hemangioma within the left hepatic lobe with homogeneous enhancement following contrast. No suspicious liver lesions. No significant biliary dilatation status post cholecystectomy. Pancreas: Compared with the previous MRI, there is mild diffuse pancreatic ductal dilatation, measuring up to 7 mm on image 17/9. There are cystic appearing lesions within the pancreatic head. The more anterior one measuring 15 mm on image 19/9 appears somewhat complex on the T2 weighted images, although shows no enhancement following contrast and has decreased in size from previous MRI. More posterior one measuring 10 mm on image 19/9 appears simple but is new. No evidence of enhancing pancreatic mass, hemorrhage or necrosis. Possible mild peripancreatic soft tissue stranding as suggested on earlier CT. Spleen: Normal in size without focal abnormality. Adrenals/Urinary Tract: Both adrenal glands appear normal. No hydronephrosis or suspicious renal  lesion. There are scattered small renal cysts bilaterally which do not require specific imaging follow-up. Stomach/Bowel: The stomach appears unremarkable for its degree of distension. No evidence of bowel wall thickening, distention or surrounding inflammatory change. Vascular/Lymphatic: There are no enlarged abdominal lymph nodes. 3.5 cm infrarenal abdominal aortic aneurysm has mildly enlarged from  previous MRI and demonstrates eccentric mural thrombus. No evidence of retroperitoneal hemorrhage or large vessel occlusion. Other: No evidence of abdominal wall hernia or ascites. Musculoskeletal: No acute or significant osseous findings. IMPRESSION: 1. Interval development of mild diffuse pancreatic ductal dilatation. No evidence of pancreatic mass or biliary ductal dilatation. 2. Dominant cystic lesion within the pancreatic head has decreased in size from MRI of 2021, probably a pseudocyst. There is a smaller cystic lesion more posteriorly in the pancreatic head which is new. Consider continued surveillance. 3. Mild peripancreatic soft tissue stranding which may reflect mild uncomplicated pancreatitis. 4. Slowly enlarging infrarenal abdominal aortic aneurysm with irregular mural thrombus. Recommend follow-up ultrasound every 2 years. This recommendation follows ACR consensus guidelines: White Paper of the ACR Incidental Findings Committee II on Vascular Findings. J Am Coll Radiol 2013; 10:789-794. Electronically Signed   By: Richardean Sale M.D.   On: 05/05/2022 11:21   MR 3D Recon At Scanner  Result Date: 05/05/2022 CLINICAL DATA:  Idiopathic recurrent acute pancreatitis with previous stent placement. Increasing abdominal pain and constipation for 1 week. EXAM: MRI ABDOMEN WITHOUT AND WITH CONTRAST (INCLUDING MRCP) TECHNIQUE: Multiplanar multisequence MR imaging of the abdomen was performed both before and after the administration of intravenous contrast. Heavily T2-weighted images of the biliary and pancreatic ducts were obtained, and three-dimensional MRCP images were rendered by post processing. CONTRAST:  66m GADAVIST GADOBUTROL 1 MMOL/ML IV SOLN COMPARISON:  Abdominal CT 05/04/2022 and 06/17/2020. Abdominal MRI 07/07/2020. FINDINGS: Despite efforts by the technologist and patient, moderate motion artifact is present on today's exam and could not be eliminated. This reduces exam sensitivity and  specificity. Lower chest:  The visualized lower chest appears unremarkable. Hepatobiliary: The hepatic contours are normal. No signs of cirrhosis or significant steatosis. Stable probable tiny hemangioma within the left hepatic lobe with homogeneous enhancement following contrast. No suspicious liver lesions. No significant biliary dilatation status post cholecystectomy. Pancreas: Compared with the previous MRI, there is mild diffuse pancreatic ductal dilatation, measuring up to 7 mm on image 17/9. There are cystic appearing lesions within the pancreatic head. The more anterior one measuring 15 mm on image 19/9 appears somewhat complex on the T2 weighted images, although shows no enhancement following contrast and has decreased in size from previous MRI. More posterior one measuring 10 mm on image 19/9 appears simple but is new. No evidence of enhancing pancreatic mass, hemorrhage or necrosis. Possible mild peripancreatic soft tissue stranding as suggested on earlier CT. Spleen: Normal in size without focal abnormality. Adrenals/Urinary Tract: Both adrenal glands appear normal. No hydronephrosis or suspicious renal lesion. There are scattered small renal cysts bilaterally which do not require specific imaging follow-up. Stomach/Bowel: The stomach appears unremarkable for its degree of distension. No evidence of bowel wall thickening, distention or surrounding inflammatory change. Vascular/Lymphatic: There are no enlarged abdominal lymph nodes. 3.5 cm infrarenal abdominal aortic aneurysm has mildly enlarged from previous MRI and demonstrates eccentric mural thrombus. No evidence of retroperitoneal hemorrhage or large vessel occlusion. Other: No evidence of abdominal wall hernia or ascites. Musculoskeletal: No acute or significant osseous findings. IMPRESSION: 1. Interval development of mild diffuse pancreatic ductal dilatation. No evidence of pancreatic mass or biliary  ductal dilatation. 2. Dominant cystic lesion  within the pancreatic head has decreased in size from MRI of 2021, probably a pseudocyst. There is a smaller cystic lesion more posteriorly in the pancreatic head which is new. Consider continued surveillance. 3. Mild peripancreatic soft tissue stranding which may reflect mild uncomplicated pancreatitis. 4. Slowly enlarging infrarenal abdominal aortic aneurysm with irregular mural thrombus. Recommend follow-up ultrasound every 2 years. This recommendation follows ACR consensus guidelines: White Paper of the ACR Incidental Findings Committee II on Vascular Findings. J Am Coll Radiol 2013; 10:789-794. Electronically Signed   By: Richardean Sale M.D.   On: 05/05/2022 11:21   CT Abdomen Pelvis W Contrast  Result Date: 05/04/2022 CLINICAL DATA:  Acute generalized abdominal pain and bloating. Constipation. EXAM: CT ABDOMEN AND PELVIS WITH CONTRAST TECHNIQUE: Multidetector CT imaging of the abdomen and pelvis was performed using the standard protocol following bolus administration of intravenous contrast. RADIATION DOSE REDUCTION: This exam was performed according to the departmental dose-optimization program which includes automated exposure control, adjustment of the mA and/or kV according to patient size and/or use of iterative reconstruction technique. CONTRAST:  150m OMNIPAQUE IOHEXOL 300 MG/ML  SOLN COMPARISON:  CT scan of June 17, 2020. MRI of July 07, 2020. FINDINGS: Lower chest: No acute abnormality. Hepatobiliary: No focal liver abnormality is seen. Status post cholecystectomy. No biliary dilatation. 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 However, there appears to be increased pancreatic ductal dilatation in the head and body of the pancreas. There is also noted mildly increased inflammatory changes around the pancreatic head suggesting acute pancreatitis. Spleen: Normal in size without focal  abnormality. Adrenals/Urinary Tract: Adrenal glands appear normal. Vascular calcifications are seen involving both kidneys as well as multiple rounded hypodensities bilaterally most consistent with cyst. No hydronephrosis or renal obstruction is noted. Urinary bladder is unremarkable. Stomach/Bowel: Stomach is within normal limits. Appendix appears normal. No evidence of bowel wall thickening, distention, or inflammatory changes. Vascular/Lymphatic: 3.8 cm infrarenal abdominal aortic aneurysm is noted. No adenopathy is noted. Reproductive: Uterus and bilateral adnexa are unremarkable. Other: No abdominal wall hernia or abnormality. No abdominopelvic ascites. Musculoskeletal: No acute or significant osseous findings. 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 There is noted mildly increased inflammatory changes around the pancreatic head suggesting acute pancreatitis. However, there is also noted increased pancreatic ductal dilatation concerning for potential obstruction. Possible neoplasm cannot be excluded, and follow-up MRI may be obtained for further evaluation. 3.8 cm infrarenal abdominal aortic aneurysm. Recommend follow-up ultrasound every 2 years. This recommendation follows ACR consensus guidelines: White Paper of the ACR Incidental Findings Committee II on Vascular Findings. J Am Coll Radiol 2013; 10:789-794. Aortic Atherosclerosis (ICD10-I70.0). Electronically Signed   By: JMarijo ConceptionM.D.   On: 05/04/2022 15:13    Pending Labs Unresulted Labs (From admission, onward)     Start     Ordered   05/06/22 0500  CBC  Tomorrow morning,   R        05/05/22 0835   05/06/22 00175 Basic metabolic panel  Tomorrow morning,   R        05/05/22 0835   05/05/22 1455  Tissue transglutaminase, IgA  Add-on,   AD        05/05/22 1454   05/05/22 1455  IgA  Add-on,   AD  05/05/22 1454             Vitals/Pain Today's Vitals   05/05/22 1345 05/05/22 1400 05/05/22 1404 05/05/22 1406  BP: (!) 109/59 (!) 145/71  (!) 145/71  Pulse: 97  95 (!) 102  Resp:    18  Temp:      TempSrc:      SpO2: 95% 93% 94% 94%  Weight:      Height:      PainSc:        Isolation Precautions No active isolations  Medications Medications  sodium chloride flush (NS) 0.9 % injection 3 mL (3 mLs Intravenous Given 05/05/22 0939)  lactated ringers infusion ( Intravenous New Bag/Given 05/05/22 1141)  acetaminophen (TYLENOL) tablet 650 mg (has no administration in time range)    Or  acetaminophen (TYLENOL) suppository 650 mg (has no administration in time range)  oxyCODONE (Oxy IR/ROXICODONE) immediate release tablet 5 mg (has no administration in time range)  albuterol (PROVENTIL) (2.5 MG/3ML) 0.083% nebulizer solution 2.5 mg (has no administration in time range)  ondansetron (ZOFRAN) injection 4 mg (has no administration in time range)  rivaroxaban (XARELTO) tablet 20 mg (has no administration in time range)  carvedilol (COREG) tablet 1.5625 mg (has no administration in time range)  pravastatin (PRAVACHOL) tablet 20 mg (has no administration in time range)  hydrOXYzine (ATARAX) tablet 10 mg (has no administration in time range)  pantoprazole (PROTONIX) EC tablet 20 mg (20 mg Oral Given 05/05/22 1338)  polyethylene glycol (MIRALAX / GLYCOLAX) packet 17 g (has no administration in time range)  ferric gluconate (FERRLECIT) 250 mg in sodium chloride 0.9 % 250 mL IVPB (has no administration in time range)  oxyCODONE-acetaminophen (PERCOCET/ROXICET) 5-325 MG per tablet 1 tablet (1 tablet Oral Given 05/04/22 1841)  lactated ringers bolus 500 mL (0 mLs Intravenous Stopped 05/05/22 0939)  gadobutrol (GADAVIST) 1 MMOL/ML injection 6 mL (6 mLs Intravenous Contrast Given 05/05/22 1050)    Mobility walks Low fall risk   Focused Assessments    R Recommendations: See Admitting Provider Note  Report given to:    Additional Notes:

## 2022-05-05 NOTE — ED Provider Notes (Signed)
[place] EMERGENCY DEPARTMENT Provider Note   CSN: [id number] Arrival date & time: 05/04/22  1745     History  Chief Complaint  Patient presents with   Abdominal Pain    [name] is a 61 y.o. 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  The patient was seen by her outpatient provider yesterday who had ordered a CT scan of the abdomen pelvis given her epigastric tenderness and prior history of pancreatitis with stent placement.  Patient was subsequently called to come to the emergency department given CT scan results consistent with acute pancreatitis and blood work obtained in the primary care physician with lipase in the 2000's.  Upon arrival, the patient states that her epigastric pain is approximately 5 out of 10 in severity and feels similar to her prior episodes of Pancreatitis.  She states that she has lost a few pounds over the past week due to poor p.o. intake.  She has nausea but no associated emesis.  Patient has no associated chest pain or shortness of breath.   Abdominal Pain Associated symptoms: nausea   Associated symptoms: no chest pain, no chills, no cough, no dysuria, no fever, no hematuria, no shortness of breath, no sore throat and no vomiting        Home Medications Prior to Admission medications   Medication Sig Start Date End Date Taking? Authorizing Provider  calcium carbonate (TUMS - DOSED IN MG ELEMENTAL CALCIUM) 500 MG chewable tablet Chew 1 tablet by mouth 2 (two) times daily as needed for indigestion or heartburn.     [provider]  carvedilol (COREG) 3.125 MG tablet 1/2 tab PO BID 11/25/21   [name], [name],  DO  cholecalciferol (VITAMIN D) 1000 units tablet Take 1,000 Units by mouth daily.    [provider]  diclofenac Sodium (VOLTAREN) 1 % GEL APPLY 2 GRAMS TOPICALLY 4 TIMES A DAY 01/10/22   Kirsteins, Luanna Salk, MD  hydrOXYzine (ATARAX) 10 MG tablet Take 1 tablet (10 mg total) by mouth at bedtime. 11/25/21   [name], [name], DO  levocetirizine (XYZAL) 5 MG tablet Take 1 tablet (5 mg total) by mouth every evening. 11/25/21   [name], [name], DO  pantoprazole (PROTONIX) 20 MG tablet Take 1 tablet (20 mg total) by mouth daily. 12/10/21   [name], [name], DO  polyethylene glycol powder (GLYCOLAX/MIRALAX) 17 GM/SCOOP powder Take 17 g by mouth 2 (two) times daily as needed. 05/04/22   [name], [name], DO  pravastatin (PRAVACHOL) 20 MG tablet Take 1 tablet (20 mg total) by mouth every evening. 11/25/21   [name], [name], DO  rivaroxaban (XARELTO) 20 MG TABS tablet TAKE 1 TABLET BY MOUTH ONCE DAILY WITH EVENING MEAL 11/25/21   [name], [name], DO  sennosides-docusate sodium (SENOKOT-S) 8.6-50 MG tablet Take 2 tablets by mouth daily. 05/04/22   [name], [name], DO  traMADol (ULTRAM) 50 MG tablet Take 1-2 tablets (50-100 mg total) by mouth 2 (two) times daily as needed. 11/25/21   [name], [name], DO  vitamin B-12 (CYANOCOBALAMIN) 100 MCG tablet Take 100 mcg by mouth daily.    [provider]      Allergies    Hydromorphone, Lopressor [metoprolol  tartrate], Sacubitril-valsartan, Allegra allergy [fexofenadine hcl], Fexofenadine, Nsaids, Gabapentin, Gabapentin (once-daily), and Lipitor [atorvastatin]    Review of Systems   Review of Systems  Constitutional:  Positive for unexpected weight change. Negative for chills and fever.  HENT:  Negative for ear pain and sore throat.   Eyes:  Negative for pain and visual disturbance.  Respiratory:  Negative for cough and shortness of breath.   Cardiovascular:  Negative for chest pain and palpitations.  Gastrointestinal:  Positive for abdominal pain and nausea.  Negative for vomiting.  Genitourinary:  Negative for dysuria and hematuria.  Musculoskeletal:  Negative for arthralgias and back pain.  Skin:  Negative for color change and rash.  Neurological:  Negative for seizures and syncope.  All other systems reviewed and are negative.   Physical Exam Updated Vital Signs BP 118/67   Pulse 91   Temp 97.8 F (36.6 C) (Oral)   Resp 17   Ht '5\' 1"'$  (1.549 m)   Wt 57.2 kg   LMP  (LMP Unknown) Comment: at age 56  SpO2 92%   BMI 23.83 kg/m  Physical Exam Vitals and nursing note reviewed.  Constitutional:      General: She is not in acute distress.    Appearance: She is well-developed.     Comments: Upon entering the exam, the patient is sitting upright awake and alert no acute distress.  HENT:     Head: Normocephalic and atraumatic.     Mouth/Throat:     Comments: Tacky mucous membranes Eyes:     Conjunctiva/sclera: Conjunctivae normal.  Cardiovascular:     Rate and Rhythm: Normal rate and regular rhythm.     Heart sounds: No murmur heard. Pulmonary:     Effort: Pulmonary effort is normal. No respiratory distress.     Breath sounds: Normal breath sounds.  Abdominal:     Palpations: Abdomen is soft.     Tenderness: There is abdominal tenderness.     Comments: Abdomen is soft with normal active bowel sounds, nondistended and with tenderness to the epigastrium and bilateral upper quadrants.  No rebound or guarding.  Musculoskeletal:        General: No swelling.     Cervical back: Neck supple.  Skin:    General: Skin is warm and dry.     Capillary Refill: Capillary refill takes less than 2 seconds.  Neurological:     General: No focal deficit present.     Mental Status: She is alert and oriented to person, place, and time.  Psychiatric:        Mood and Affect: Mood normal.     ED Results / Procedures / Treatments   Labs (all labs ordered are listed, but only abnormal results are displayed) Labs Reviewed  CBC WITH  DIFFERENTIAL/PLATELET - Abnormal; Notable for the following components:      Result Value   WBC 11.7 (*)    Hemoglobin 10.7 (*)    HCT 34.2 (*)    MCV 76.5 (*)    MCH 23.9 (*)    RDW 15.7 (*)    Platelets 540 (*)    Monocytes Absolute 1.1 (*)    All other components within normal limits  COMPREHENSIVE METABOLIC PANEL - Abnormal; Notable for the following components:   Sodium 132 (*)    CO2 19 (*)    All other components within normal limits  LIPASE, BLOOD - Abnormal; Notable for the following components:   Lipase 614 (*)    All other components  within normal limits  URINALYSIS, ROUTINE W REFLEX MICROSCOPIC - Abnormal; Notable for the following components:   Color, Urine STRAW (*)    Hgb urine dipstick SMALL (*)    Ketones, ur 5 (*)    All other components within normal limits    EKG None  Radiology CT Abdomen Pelvis W Contrast  Result Date: 05/04/2022 CLINICAL DATA:  Acute generalized abdominal pain and bloating. Constipation. EXAM: CT ABDOMEN AND PELVIS WITH CONTRAST TECHNIQUE: Multidetector CT imaging of the abdomen and pelvis was performed using the standard protocol following bolus administration of intravenous contrast. RADIATION DOSE REDUCTION: This exam was performed according to the departmental dose-optimization program which includes automated exposure control, adjustment of the mA and/or kV according to patient size and/or use of iterative reconstruction technique. CONTRAST:  110m OMNIPAQUE IOHEXOL 300 MG/ML  SOLN COMPARISON:  CT scan of June 17, 2020. MRI of July 07, 2020. FINDINGS: Lower chest: No acute abnormality. Hepatobiliary: No focal liver abnormality is seen. Status post cholecystectomy. No biliary dilatation. 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 However, there appears to be increased pancreatic ductal dilatation in the head and body of the  pancreas. There is also noted mildly increased inflammatory changes around the pancreatic head suggesting acute pancreatitis. Spleen: Normal in size without focal abnormality. Adrenals/Urinary Tract: Adrenal glands appear normal. Vascular calcifications are seen involving both kidneys as well as multiple rounded hypodensities bilaterally most consistent with cyst. No hydronephrosis or renal obstruction is noted. Urinary bladder is unremarkable. Stomach/Bowel: Stomach is within normal limits. Appendix appears normal. No evidence of bowel wall thickening, distention, or inflammatory changes. Vascular/Lymphatic: 3.8 cm infrarenal abdominal aortic aneurysm is noted. No adenopathy is noted. Reproductive: Uterus and bilateral adnexa are unremarkable. Other: No abdominal wall hernia or abnormality. No abdominopelvic ascites. Musculoskeletal: No acute or significant osseous findings. 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 There is noted mildly increased inflammatory changes around the pancreatic head suggesting acute pancreatitis. However, there is also noted increased pancreatic ductal dilatation concerning for potential obstruction. Possible neoplasm cannot be excluded, and follow-up MRI may be obtained for further evaluation. 3.8 cm infrarenal abdominal aortic aneurysm. Recommend follow-up ultrasound every 2 years. This recommendation follows ACR consensus guidelines: White Paper of the ACR Incidental Findings Committee II on Vascular Findings. J Am Coll Radiol 2013; 10:789-794. Aortic Atherosclerosis (ICD10-I70.0). Electronically Signed   By: JMarijo ConceptionM.D.   On: 05/04/2022 15:13    Procedures Procedures    Medications Ordered in ED Medications  oxyCODONE-acetaminophen (PERCOCET/ROXICET) 5-325 MG per tablet 1 tablet (1 tablet Oral Given 05/04/22 1841)    ED Course/ Medical Decision Making/ A&P                            Medical Decision Making Amount and/or Complexity of Data Reviewed Independent Historian: spouse Radiology: ordered and independent interpretation performed. Decision-making details documented in ED Course.  Risk Decision regarding hospitalization.    The patient presents to the emergency department hemodynamically stable, afebrile and with laboratory and radiographic evidence of pancreatitis as well as a physical exam and history to correspond with such.  I have personally reviewed and interpreted the patient CT scan of the abdomen pelvis obtained yesterday which reveals hypodense area in the pancreatic head with increased inflammatory changes  to suggest acute pancreatitis.  Patient does have a pseudocyst seen on prior imaging in the same area however there is new ductal dilation concerning for obstructive process such as malignancy.  Given the patient's lipase elevation, evidence of acute pancreatitis and potential new obstructive phenomena with history of prior stent, will admit patient for further radiographic workup and/or potential need for new stent.  Patient does not wish to have analgesia at this time.  Will provide IV fluids   Pt admitted to hospitalist in stable condition.          Final Clinical Impression(s) / ED Diagnoses Final diagnoses:  Acute pancreatitis, unspecified complication status, unspecified pancreatitis type    Rx / DC Orders ED Discharge Orders     None         Levie Heritage, MD 05/05/22 [id number]    Gareth Morgan, MD 05/05/22 2203

## 2022-05-05 NOTE — ED Notes (Addendum)
Attempted report again to [facility] (still no purple man); spoke with Tiffany, RN who states she is not longer getting that pt, states Levada Dy, RN will be taking that pt - am given Angela's extension  Called and spoke with Levada Dy, start to give report, Levada Dy states she hasn't had that room all day and needs to clarify with charge if she does in fact have that room - Levada Dy to call this RN back

## 2022-05-05 NOTE — ED Notes (Signed)
Pt ambulatory to and from restroom with steady gait 

## 2022-05-05 NOTE — Consult Note (Addendum)
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 She apparently hasn't had any further episodes of pancreatitis until now.   # Abdominal pain / bloating in setting of constipation but also lipase of ~ 2000 and MRI suggesting mild uncomplicated pancreatitis and interval development of mild diffuse pancreatic ductal dilation. Prior pancreatic cystic lesion has decreased but there is a new smaller cystic lesion present.  Interesting her abdominal pain / bloating resolved following a large BM today and now she wants to go home. Dr. Lorenso Courier will review the imaging studies.   Might she be developed chronic pancreatitis?  She will need a follow up with [name] Landmark so I went ahead and made this for 06/29/22 at 8:30 am Unsure why sudden onset of constipation. We discussed this, it could be the ultram. Advised her to take Miralax every other day or even daily.   # Recurrent iron deficiency anemia. Hgb ~ 11, down from 14 a year ago.  Ferritin 14.  Interestingly her % of iron saturation has previously been elevated.  No overt GI bleeding other than minor bleeding with recent constipation. She is up to date on colonoscopy.  Will obtain celiac serologies.  Consider dose of IV iron prior to discharge. She has been constipated and PO iron may exacerbate that.   # Infrarenal abdominal aortic aneurysm ( 3.5 cm)  with irregular mural thrombus.   # Afib, on Xarelto  See PMH for additional medical problems   HPI   [name] is a 61 y.o. female with a past medical  history significant for  CVA, atrial fibrillation (on anticoagulation), CAD status post CABG, ischemic cardiomyopathy, infrarenal AAA , recurrent idiopathic acute pancreatitis (status post ERCP with CBD and PD sphincterotomies for equivocal SOD findings on manometry), status post cholecystectomy, pancreatic cyst, colon polyps, and GERD.  See PMH for any additional medical problems.   07/14/21 - Office visit with [name] Landmark Her history is significant for multiple episodes of acute recurrent pancreatitis and a smoldering pancreatitis.  Eventually when functional disorder of the sphincter was concerned and we ended up having her be evaluated at [facility] advanced endoscopy clinic where in which she underwent high risk ERCP with manometry.  She had equivocal findings for sphincter of Oddi and in the setting of her clinical history and dilated bile ducts and elevated pancreatic enzymes underwent both CBD and PD sphincterotomies.  She had a pancreatic stent placed for prophylaxis and then did not follow-up with [facility].  When she decided to follow-up with [place] here we had her obtain a KUB and the pancreas stent is no longer present.  Plan was for cross-sectional imaging in 2023 (pancreas protocol CT abdomen versus MRI/MRCP abdomen) to follow-up prior pancreas cyst  Interval history:  Seen by PCP yesterday for constipation, bloating and abdominal pain.  She had tried multiple things for constipation in the preceding week ( suppositories,  enemas, senokot and miralax)  Her PCP obtained multiple labs, arranged for CT scan, increased her Senokot and MiraLAX.  She was advised to go to the ED if things got worse.  Called later with labs. Her lipase was ~ 2000, liver chemistries were normal.  Hemoglobin was 11.1, down from baseline of 14 . CT w contrast suggested acute pancreatitis.  There was increased pancreatic ductal dilation concerning for possible obstruction. She was sent to ED yesterday.   ED evaluation  Tachycardic  at 116, normotensive. Afebrile Lipase 614, LFTs normal. WBC 11.7, hgb 10.7, MCV 76, platelets 540  Evania wants to go home if possible. She had a good BM today and feels fine. She says that she had abdominal discomfort and bloating because of the constipation. She isn't sure why she developed constipation. She typically has a BM every day. She started ultram a couple of weeks ago. She recently took one dose of hydrocodone for knee pain. No other new medications. She has been eating and drinking fluids at home.    Previous GI Evaluation    Nov 2022 screening colonoscopy  - Hemorrhoids found on digital rectal exam. - There was significant looping of the colon. - Nine, 2 to 10 mm polyps in the rectum, at the recto-sigmoid colon, in the sigmoid colon and in the transverse colon, removed with a cold snare. Resected and retrieved. - Diverticulosis in the recto-sigmoid colon, in the sigmoid colon and in the descending colon. - Normal mucosa in the entire examined colon otherwise. - Non-bleeding non-thrombosed external and internal hemorrhoids.  Diagnosis Surgical [P], colon, rectum, recto-sigmoid, sigmoid, and transverse, polyp (8) - TUBULAR ADENOMA WITHOUT HIGH-GRADE DYSPLASIA OR MALIGNANCY - HYPERPLASTIC POLYP(  Recent Labs and Imaging MR ABDOMEN MRCP W WO CONTAST  Result Date: 05/05/2022 CLINICAL DATA:  Idiopathic recurrent acute pancreatitis with previous stent placement. Increasing abdominal pain and constipation for 1 week. EXAM: MRI ABDOMEN WITHOUT AND WITH CONTRAST (INCLUDING MRCP) TECHNIQUE: Multiplanar multisequence MR imaging of the abdomen was performed both before and after the administration of intravenous contrast. Heavily T2-weighted images of the biliary and pancreatic ducts were obtained, and three-dimensional MRCP images were rendered by post processing. CONTRAST:  75m GADAVIST GADOBUTROL 1 MMOL/ML IV SOLN COMPARISON:  Abdominal CT 05/04/2022 and 06/17/2020. Abdominal MRI  07/07/2020. FINDINGS: Despite efforts by the technologist and patient, moderate motion artifact is present on today's exam and could not be eliminated. This reduces exam sensitivity and specificity. Lower chest:  The visualized lower chest appears unremarkable. Hepatobiliary: The hepatic contours are normal. No signs of cirrhosis or significant steatosis. Stable probable tiny hemangioma within the left hepatic lobe with homogeneous enhancement following contrast. No suspicious liver lesions. No significant biliary dilatation status post cholecystectomy. Pancreas: Compared with the previous MRI, there is mild diffuse pancreatic ductal dilatation, measuring up to 7 mm on image 17/9. There are cystic appearing lesions within the pancreatic head. The more anterior one measuring 15 mm on image 19/9 appears somewhat complex on the T2 weighted images, although shows no enhancement following contrast and has decreased in size from previous MRI. More posterior one measuring 10 mm on image 19/9 appears simple but is new. No evidence of enhancing pancreatic mass, hemorrhage or necrosis. Possible mild peripancreatic soft tissue stranding as suggested on earlier CT. Spleen: Normal in size without focal abnormality. Adrenals/Urinary Tract: Both adrenal glands appear normal. No hydronephrosis or suspicious renal lesion. There are scattered small renal cysts bilaterally which do not require specific imaging follow-up. Stomach/Bowel: The stomach  appears unremarkable for its degree of distension. No evidence of bowel wall thickening, distention or surrounding inflammatory change. Vascular/Lymphatic: There are no enlarged abdominal lymph nodes. 3.5 cm infrarenal abdominal aortic aneurysm has mildly enlarged from previous MRI and demonstrates eccentric mural thrombus. No evidence of retroperitoneal hemorrhage or large vessel occlusion. Other: No evidence of abdominal wall hernia or ascites. Musculoskeletal: No acute or significant  osseous findings. IMPRESSION: 1. Interval development of mild diffuse pancreatic ductal dilatation. No evidence of pancreatic mass or biliary ductal dilatation. 2. Dominant cystic lesion within the pancreatic head has decreased in size from MRI of 2021, probably a pseudocyst. There is a smaller cystic lesion more posteriorly in the pancreatic head which is new. Consider continued surveillance. 3. Mild peripancreatic soft tissue stranding which may reflect mild uncomplicated pancreatitis. 4. Slowly enlarging infrarenal abdominal aortic aneurysm with irregular mural thrombus. Recommend follow-up ultrasound every 2 years. This recommendation follows ACR consensus guidelines: White Paper of the ACR Incidental Findings Committee II on Vascular Findings. J Am Coll Radiol 2013; 10:789-794. Electronically Signed   By: Richardean Sale M.D.   On: 05/05/2022 11:21   MR 3D Recon At Scanner  Result Date: 05/05/2022 CLINICAL DATA:  Idiopathic recurrent acute pancreatitis with previous stent placement. Increasing abdominal pain and constipation for 1 week. EXAM: MRI ABDOMEN WITHOUT AND WITH CONTRAST (INCLUDING MRCP) TECHNIQUE: Multiplanar multisequence MR imaging of the abdomen was performed both before and after the administration of intravenous contrast. Heavily T2-weighted images of the biliary and pancreatic ducts were obtained, and three-dimensional MRCP images were rendered by post processing. CONTRAST:  97m GADAVIST GADOBUTROL 1 MMOL/ML IV SOLN COMPARISON:  Abdominal CT 05/04/2022 and 06/17/2020. Abdominal MRI 07/07/2020. FINDINGS: Despite efforts by the technologist and patient, moderate motion artifact is present on today's exam and could not be eliminated. This reduces exam sensitivity and specificity. Lower chest:  The visualized lower chest appears unremarkable. Hepatobiliary: The hepatic contours are normal. No signs of cirrhosis or significant steatosis. Stable probable tiny hemangioma within the left hepatic  lobe with homogeneous enhancement following contrast. No suspicious liver lesions. No significant biliary dilatation status post cholecystectomy. Pancreas: Compared with the previous MRI, there is mild diffuse pancreatic ductal dilatation, measuring up to 7 mm on image 17/9. There are cystic appearing lesions within the pancreatic head. The more anterior one measuring 15 mm on image 19/9 appears somewhat complex on the T2 weighted images, although shows no enhancement following contrast and has decreased in size from previous MRI. More posterior one measuring 10 mm on image 19/9 appears simple but is new. No evidence of enhancing pancreatic mass, hemorrhage or necrosis. Possible mild peripancreatic soft tissue stranding as suggested on earlier CT. Spleen: Normal in size without focal abnormality. Adrenals/Urinary Tract: Both adrenal glands appear normal. No hydronephrosis or suspicious renal lesion. There are scattered small renal cysts bilaterally which do not require specific imaging follow-up. Stomach/Bowel: The stomach appears unremarkable for its degree of distension. No evidence of bowel wall thickening, distention or surrounding inflammatory change. Vascular/Lymphatic: There are no enlarged abdominal lymph nodes. 3.5 cm infrarenal abdominal aortic aneurysm has mildly enlarged from previous MRI and demonstrates eccentric mural thrombus. No evidence of retroperitoneal hemorrhage or large vessel occlusion. Other: No evidence of abdominal wall hernia or ascites. Musculoskeletal: No acute or significant osseous findings. IMPRESSION: 1. Interval development of mild diffuse pancreatic ductal dilatation. No evidence of pancreatic mass or biliary ductal dilatation. 2. Dominant cystic lesion within the pancreatic head has decreased in size from MRI of 2021,  probably a pseudocyst. There is a smaller cystic lesion more posteriorly in the pancreatic head which is new. Consider continued surveillance. 3. Mild  peripancreatic soft tissue stranding which may reflect mild uncomplicated pancreatitis. 4. Slowly enlarging infrarenal abdominal aortic aneurysm with irregular mural thrombus. Recommend follow-up ultrasound every 2 years. This recommendation follows ACR consensus guidelines: White Paper of the ACR Incidental Findings Committee II on Vascular Findings. J Am Coll Radiol 2013; 10:789-794. Electronically Signed   By: Richardean Sale M.D.   On: 05/05/2022 11:21   CT Abdomen Pelvis W Contrast  Result Date: 05/04/2022 CLINICAL DATA:  Acute generalized abdominal pain and bloating. Constipation. EXAM: CT ABDOMEN AND PELVIS WITH CONTRAST TECHNIQUE: Multidetector CT imaging of the abdomen and pelvis was performed using the standard protocol following bolus administration of intravenous contrast. RADIATION DOSE REDUCTION: This exam was performed according to the departmental dose-optimization program which includes automated exposure control, adjustment of the mA and/or kV according to patient size and/or use of iterative reconstruction technique. CONTRAST:  134m OMNIPAQUE IOHEXOL 300 MG/ML  SOLN COMPARISON:  CT scan of June 17, 2020. MRI of July 07, 2020. FINDINGS: Lower chest: No acute abnormality. Hepatobiliary: No focal liver abnormality is seen. Status post cholecystectomy. No biliary dilatation. 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 However, there appears to be increased pancreatic ductal dilatation in the head and body of the pancreas. There is also noted mildly increased inflammatory changes around the pancreatic head suggesting acute pancreatitis. Spleen: Normal in size without focal abnormality. Adrenals/Urinary Tract: Adrenal glands appear normal. Vascular calcifications are seen involving both kidneys as well as multiple rounded hypodensities bilaterally most consistent with cyst. No  hydronephrosis or renal obstruction is noted. Urinary bladder is unremarkable. Stomach/Bowel: Stomach is within normal limits. Appendix appears normal. No evidence of bowel wall thickening, distention, or inflammatory changes. Vascular/Lymphatic: 3.8 cm infrarenal abdominal aortic aneurysm is noted. No adenopathy is noted. Reproductive: Uterus and bilateral adnexa are unremarkable. Other: No abdominal wall hernia or abnormality. No abdominopelvic ascites. Musculoskeletal: No acute or significant osseous findings. 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 There is noted mildly increased inflammatory changes around the pancreatic head suggesting acute pancreatitis. However, there is also noted increased pancreatic ductal dilatation concerning for potential obstruction. Possible neoplasm cannot be excluded, and follow-up MRI may be obtained for further evaluation. 3.8 cm infrarenal abdominal aortic aneurysm. Recommend follow-up ultrasound every 2 years. This recommendation follows ACR consensus guidelines: White Paper of the ACR Incidental Findings Committee II on Vascular Findings. J Am Coll Radiol 2013; 10:789-794. Aortic Atherosclerosis (ICD10-I70.0). Electronically Signed   By: JMarijo ConceptionM.D.   On: 05/04/2022 15:13    Labs:  Recent Labs    05/04/22 1038 05/04/22 1805  WBC 11.2* 11.7*  HGB 11.1* 10.7*  HCT 35.5* 34.2*  PLT 564.0* 540*   Recent Labs    05/04/22 1038 05/04/22 1805  NA 135 132*  K 4.6 3.9  CL 101 102  CO2 22 19*  GLUCOSE 98 76  BUN 14 13  CREATININE 0.68 0.73  CALCIUM 9.6 9.3   Recent Labs    05/04/22 1805  PROT 7.3  ALBUMIN 3.7  AST 24  ALT 14  ALKPHOS 71  BILITOT 0.6   No results for input(s): "HEPBSAG", "HCVAB", "HEPAIGM", "HEPBIGM" in the last 72 hours. No results  for input(s): "LABPROT", "INR" in the last 72 hours.  Past Medical History:  Diagnosis  Date   AAA (abdominal aortic aneurysm) ([place])    a. 3.3cm infrarenal by CT 02/2020.   Abnormal findings on endoscopic ultrasound (EUS) 01/25/2020   Abnormality present on pathology (Atypical Pathology) 01/25/2020   Allergy    Angio-edema    Angioedema    Arthritis    CAD (coronary artery disease)    a. s/p NSTEMI in 10/2017 with cath showing LM disease --> s/p CABG on 11/23/2017 with LIMA-LAD and SVG-OM.    Carotid stenosis    a. Duplex 11/2018 3-47% RICA, 42-59% LICA.   Closed nondisplaced fracture of greater trochanter of right femur ([place]) 04/09/2020   Cystocele, unspecified  03/03/2020   Gallbladder sludge and cholelithiasis without cholecystitis 01/25/2020   Small stones of the gallbladder appreciated on CT 02/2020 as well.   Gastritis and duodenitis 12/06/2019   11/2019 Peptic duodenitis  -  No dysplasia or malignancy identified  B. STOMACH, BIOPSY:  -  Mild chronic gastritis with intestinal metaplasia    Gastroesophageal reflux disease 02/26/2021   Hay fever    History of kidney stones    Hyperlipidemia    Idiopathic recurrent acute pancreatitis 01/25/2020   Ischemic cardiomyopathy    a. EF 35-40% by echo in 10/2017. b. 55% by echo 01/2020.   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   Stroke ([place]) 08/23/2017   Left MCA infarct status post TPA and thrombectomy   Urticaria     Past Surgical History:  Procedure Laterality Date   BIOPSY  12/02/2019   Procedure: BIOPSY;  Surgeon: Irving Copas., MD;  Location: [facility];  Service: Gastroenterology;;   CHOLECYSTECTOMY N/A 04/30/2020   Procedure: LAPAROSCOPIC CHOLECYSTECTOMY WITH INTRAOPERATIVE CHOLANGIOGRAM;  Surgeon: Armandina Gemma, MD;  Location:  [facility];  Service: General;  Laterality: N/A;   CORONARY ARTERY BYPASS GRAFT N/A 11/23/2017   Procedure: CORONARY ARTERY BYPASS GRAFTING (CABG) x 2, ON PUMP, USING LEFT INTERNAL MAMMARY ARTERY AND RIGHT GREATER SAPHENOUS VEIN HARVESTED ENDOSCOPICALLY;  Surgeon: Gaye Pollack, MD;  Location: [facility];  Service: Open Heart Surgery;  Laterality: N/A;   CRYOABLATION     cervical   ESOPHAGOGASTRODUODENOSCOPY (EGD) WITH PROPOFOL N/A 12/02/2019   Procedure: ESOPHAGOGASTRODUODENOSCOPY (EGD) WITH PROPOFOL;  Surgeon: Rush Landmark Telford Nab., MD;  Location: [facility];  Service: Gastroenterology;  Laterality: N/A;   ESOPHAGOGASTRODUODENOSCOPY (EGD) WITH PROPOFOL N/A 03/19/2020   Procedure: ESOPHAGOGASTRODUODENOSCOPY (EGD) WITH PROPOFOL;  Surgeon: Milus Banister, MD;  Location: [facility];  Service: Endoscopy;  Laterality: N/A;   EUS N/A 03/19/2020   Procedure: UPPER ENDOSCOPIC ULTRASOUND (EUS) RADIAL;  Surgeon: Milus Banister, MD;  Location: [facility];  Service: Endoscopy;  Laterality: N/A;   EUS N/A 03/19/2020   Procedure: UPPER ENDOSCOPIC ULTRASOUND (EUS) LINEAR;  Surgeon: Milus Banister, MD;  Location: [facility];  Service: Endoscopy;  Laterality: N/A;   FINE NEEDLE ASPIRATION  12/02/2019   Procedure: FINE NEEDLE ASPIRATION (FNA) LINEAR;  Surgeon: Irving Copas., MD;  Location: [facility];  Service: Gastroenterology;;   FINE NEEDLE ASPIRATION N/A 03/19/2020   Procedure: FINE NEEDLE ASPIRATION (FNA) LINEAR;  Surgeon: Milus Banister, MD;  Location: [facility];  Service: Endoscopy;  Laterality: N/A;   IR ANGIO INTRA EXTRACRAN SEL COM CAROTID INNOMINATE UNI R MOD SED  08/21/2017   IR ANGIO VERTEBRAL SEL SUBCLAVIAN INNOMINATE UNI R MOD SED  08/21/2017   IR PERCUTANEOUS ART THROMBECTOMY/INFUSION INTRACRANIAL INC DIAG ANGIO  08/21/2017   IR RADIOLOGIST EVAL & MGMT  10/30/2017   LOOP RECORDER INSERTION N/A 08/28/2017   Procedure: LOOP RECORDER INSERTION;  Surgeon: Constance Haw, MD;   Location: [facility];  Service: Cardiovascular;  Laterality: N/A;   RADIOLOGY WITH ANESTHESIA N/A 08/21/2017   Procedure: RADIOLOGY WITH ANESTHESIA;  Surgeon: Luanne Bras, MD;  Location: [facility];  Service: Radiology;  Laterality: N/A;   RIGHT/LEFT HEART CATH AND CORONARY ANGIOGRAPHY N/A 11/20/2017   Procedure: RIGHT/LEFT HEART CATH AND CORONARY ANGIOGRAPHY;  Surgeon: Jettie Booze, MD;  Location: [facility];  Service: Cardiovascular;  Laterality: N/A;   TEE WITHOUT CARDIOVERSION N/A 08/28/2017   Procedure: TRANSESOPHAGEAL ECHOCARDIOGRAM (TEE);  Surgeon: Fay Records, MD;  Location: [facility];  Service: Cardiovascular;  Laterality: N/A;   TEE WITHOUT CARDIOVERSION N/A 11/23/2017   Procedure: TRANSESOPHAGEAL ECHOCARDIOGRAM (TEE);  Surgeon: Gaye Pollack, MD;  Location: [facility];  Service: Open Heart Surgery;  Laterality: N/A;   TUBAL LIGATION     UPPER ESOPHAGEAL ENDOSCOPIC ULTRASOUND (EUS) N/A 12/02/2019   Procedure: UPPER ESOPHAGEAL ENDOSCOPIC ULTRASOUND (EUS);  Surgeon: Irving Copas., MD;  Location: [facility];  Service: Gastroenterology;  Laterality: N/A;    Family History  Problem Relation Age of Onset   Colon polyps Mother    Hypertension Mother    Hyperlipidemia Mother    Diabetes type II Mother    Urticaria Mother    Heart attack Father    Hypertension Brother    Stomach cancer Maternal Grandfather    Breast cancer Cousin 67   Immunodeficiency Neg Hx    Eczema Neg Hx    Atopy Neg Hx    Asthma Neg Hx    Angioedema Neg Hx    Allergic rhinitis Neg Hx    Colon cancer Neg Hx    Esophageal cancer Neg Hx    Pancreatic cancer Neg Hx    Esophageal varices Neg Hx    Inflammatory bowel disease Neg Hx    Rectal cancer Neg Hx     Prior to Admission medications   Medication Sig Start Date End Date Taking? Authorizing Provider  calcium carbonate (TUMS - DOSED IN MG ELEMENTAL CALCIUM) 500 MG chewable tablet Chew 1 tablet by mouth 2 (two) times  daily as needed for indigestion or heartburn.     [provider]  carvedilol (COREG) 3.125 MG tablet 1/2 tab PO BID 11/25/21   [name], [name], DO  cholecalciferol (VITAMIN D) 1000 units tablet Take 1,000 Units by mouth daily.    [provider]  diclofenac Sodium (VOLTAREN) 1 % GEL APPLY 2 GRAMS TOPICALLY 4 TIMES A DAY 01/10/22   Kirsteins, Luanna Salk, MD  hydrOXYzine (ATARAX) 10 MG tablet Take 1 tablet (10 mg total) by mouth at bedtime. 11/25/21   [name], [name], DO  levocetirizine (XYZAL) 5 MG tablet Take 1 tablet (5 mg total) by mouth every evening. 11/25/21   [name], [name], DO  pantoprazole (PROTONIX) 20 MG tablet Take 1 tablet (20 mg total) by mouth daily. 12/10/21   [name], [name], DO  polyethylene glycol powder (GLYCOLAX/MIRALAX) 17 GM/SCOOP powder Take 17 g by mouth 2 (two) times daily as needed. 05/04/22  [name], [name], DO  pravastatin (PRAVACHOL) 20 MG tablet Take 1 tablet (20 mg total) by mouth every evening. 11/25/21   [name], [name], DO  rivaroxaban (XARELTO) 20 MG TABS tablet TAKE 1 TABLET BY MOUTH ONCE DAILY WITH EVENING MEAL 11/25/21   [name], [name], DO  sennosides-docusate sodium (SENOKOT-S) 8.6-50 MG tablet Take 2 tablets by mouth daily. 05/04/22   [name], [name], DO  traMADol (ULTRAM) 50 MG tablet Take 1-2 tablets (50-100 mg total) by mouth 2 (two) times daily as needed. 11/25/21   [name], [name], DO  vitamin B-12 (CYANOCOBALAMIN) 100 MCG tablet Take 100 mcg by mouth daily.    [provider]    Current Facility-Administered Medications  Medication Dose Route Frequency Provider Last Rate Last Admin   acetaminophen (TYLENOL) tablet 650 mg  650 mg Oral Q6H PRN Norval Morton, MD       Or   acetaminophen (TYLENOL) suppository 650 mg  650 mg Rectal Q6H PRN Fuller Plan A, MD       albuterol (PROVENTIL) (2.5 MG/3ML) 0.083% nebulizer solution 2.5 mg  2.5 mg Nebulization Q6H PRN Fuller Plan A, MD       carvedilol (COREG) tablet 1.5625 mg  1.5625 mg Oral  BID WC Smith, Rondell A, MD       hydrOXYzine (ATARAX) tablet 10 mg  10 mg Oral QHS Smith, Rondell A, MD       lactated ringers infusion   Intravenous Continuous Fuller Plan A, MD 150 mL/hr at 05/05/22 1141 New Bag at 05/05/22 1141   ondansetron (ZOFRAN) injection 4 mg  4 mg Intravenous Q6H PRN Fuller Plan A, MD       oxyCODONE (Oxy IR/ROXICODONE) immediate release tablet 5 mg  5 mg Oral Q4H PRN Fuller Plan A, MD       pantoprazole (PROTONIX) EC tablet 20 mg  20 mg Oral Daily Smith, Rondell A, MD       pravastatin (PRAVACHOL) tablet 20 mg  20 mg Oral QPM Smith, Rondell A, MD       rivaroxaban (XARELTO) tablet 20 mg  20 mg Oral Q supper Tamala Julian, Rondell A, MD       sodium chloride flush (NS) 0.9 % injection 3 mL  3 mL Intravenous Q12H Smith, Rondell A, MD   3 mL at 05/05/22 8099   Current Outpatient Medications  Medication Sig Dispense Refill   calcium carbonate (TUMS - DOSED IN MG ELEMENTAL CALCIUM) 500 MG chewable tablet Chew 1 tablet by mouth 2 (two) times daily as needed for indigestion or heartburn.      carvedilol (COREG) 3.125 MG tablet 1/2 tab PO BID 90 tablet 1   cholecalciferol (VITAMIN D) 1000 units tablet Take 1,000 Units by mouth daily.     diclofenac Sodium (VOLTAREN) 1 % GEL APPLY 2 GRAMS TOPICALLY 4 TIMES A DAY 300 g 0   hydrOXYzine (ATARAX) 10 MG tablet Take 1 tablet (10 mg total) by mouth at bedtime. 90 tablet 1   levocetirizine (XYZAL) 5 MG tablet Take 1 tablet (5 mg total) by mouth every evening. 90 tablet 3   pantoprazole (PROTONIX) 20 MG tablet Take 1 tablet (20 mg total) by mouth daily. 90 tablet 1   polyethylene glycol powder (GLYCOLAX/MIRALAX) 17 GM/SCOOP powder Take 17 g by mouth 2 (two) times daily as needed. 3350 g 1   pravastatin (PRAVACHOL) 20 MG tablet Take 1 tablet (20 mg total) by mouth every evening. 90 tablet 3   rivaroxaban (XARELTO)  20 MG TABS tablet TAKE 1 TABLET BY MOUTH ONCE DAILY WITH EVENING MEAL 30 tablet 5   sennosides-docusate sodium  (SENOKOT-S) 8.6-50 MG tablet Take 2 tablets by mouth daily. 60 tablet 2   traMADol (ULTRAM) 50 MG tablet Take 1-2 tablets (50-100 mg total) by mouth 2 (two) times daily as needed. 120 tablet 5   vitamin B-12 (CYANOCOBALAMIN) 100 MCG tablet Take 100 mcg by mouth daily.      Allergies as of 05/04/2022 - Review Complete 05/04/2022  Allergen Reaction Noted   Hydromorphone Other (See Comments) 09/24/2019   Lopressor [metoprolol tartrate] Swelling 09/05/2017   Sacubitril-valsartan Swelling and Rash 03/20/2020   Allegra allergy [fexofenadine hcl] Hives 07/16/2019   Fexofenadine Hives 07/14/2020   Nsaids Other (See Comments) 10/24/2018   Gabapentin Rash 07/14/2020   Gabapentin (once-daily) Rash 07/01/2019   Lipitor [atorvastatin] Diarrhea 10/13/2017    Social History   Socioeconomic History   Marital status: Married    Spouse name: Not on file   Number of children: 2   Years of education: Not on file   Highest education level: Not on file  Occupational History   Not on file  Tobacco Use   Smoking status: Some Days    Packs/day: 0.10    Years: 37.00    Total pack years: 3.70    Types: Cigarettes   Smokeless tobacco: Never  Vaping Use   Vaping Use: Never used  Substance and Sexual Activity   Alcohol use: No   Drug use: No   Sexual activity: Yes    Partners: Male  Other Topics Concern   Not on file  Social History Narrative   Married. 2 children.    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    Walks with cane or wheelchair.    Former smoker.    Drinks caffeine.    Smoke alarm in the home.   Firearms in the home.    Wears her seatbelt.    Feels safe In her relationships.    Social Determinants of Health   Financial Resource Strain: Not on file  Food Insecurity: Not on file  Transportation Needs: Not on file  Physical Activity: Not on file  Stress: Not on file  Social Connections: Unknown (09/05/2017)   Social Connection and Isolation Panel [NHANES]    Frequency of  Communication with Friends and Family: Patient refused    Frequency of Social Gatherings with Friends and Family: Patient refused    Attends Religious Services: Patient refused    Active Member of Clubs or Organizations: Patient refused    Attends Archivist Meetings: Patient refused    Marital Status: Patient refused  Intimate Partner Violence: Unknown (09/05/2017)   Humiliation, Afraid, Rape, and Kick questionnaire    Fear of Current or Ex-Partner: Patient refused    Emotionally Abused: Patient refused    Physically Abused: Patient refused    Sexually Abused: Patient refused    Review of Systems: All systems reviewed and negative except where noted in HPI.  Physical Exam: Vital signs in last 24 hours: Temp:  [97.5 F (36.4 C)-98.5 F (36.9 C)] 97.6 F (36.4 C) (08/10 1330) Pulse Rate:  [86-116] 94 (08/10 1330) Resp:  [11-18] 18 (08/10 1322) BP: (107-147)/(56-80) 119/71 (08/10 1315) SpO2:  [92 %-98 %] 95 % (08/10 1330) Weight:  [57.2 kg] 57.2 kg (08/09 1808)    General:  Alert female in NAD Psych:  Pleasant, cooperative. Normal mood and affect Eyes: Pupils equal, no icterus. Conjunctive pink Ears:  Normal auditory acuity Nose: No deformity, discharge or lesions Neck:  Supple, no masses felt Lungs:  Clear to auscultation.  Heart:  Regular rate, regular rhythm. No lower extremity edema Abdomen:  Soft, nondistended, nontender, active bowel sounds, no masses felt Rectal :  Deferred Msk: Symmetrical without gross deformities.  Neurologic:  Alert, oriented, grossly normal neurologically Skin:  Intact without significant lesions.    Intake/Output from previous day: No intake/output data recorded. Intake/Output this shift:  No intake/output data recorded.    Principal Problem:   Pancreatitis Active Problems:   Hyperlipidemia LDL goal <70   Paroxysmal atrial fibrillation (HCC)   AAA (abdominal aortic aneurysm) ([place]), infrarenal - ulcerative plaque (3.1cm)-  09/10/2019    Tye Savoy, NP-C @  05/05/2022, 1:32 PM

## 2022-05-06 DIAGNOSIS — K859 Acute pancreatitis without necrosis or infection, unspecified: Secondary | ICD-10-CM | POA: Diagnosis not present

## 2022-05-06 LAB — BASIC METABOLIC PANEL
Anion gap: 6 (ref 5–15)
BUN: 11 mg/dL (ref 8–23)
CO2: 23 mmol/L (ref 22–32)
Calcium: 8.5 mg/dL — ABNORMAL LOW (ref 8.9–10.3)
Chloride: 108 mmol/L (ref 98–111)
Creatinine, Ser: 0.68 mg/dL (ref 0.44–1.00)
GFR, Estimated: >60 mL/min (ref >60)
Glucose, Bld: 86 mg/dL (ref 70–99)
Potassium: 3.6 mmol/L (ref 3.5–5.1)
Sodium: 137 mmol/L (ref 135–145)

## 2022-05-06 LAB — CBC
HCT: 31.1 % — ABNORMAL LOW (ref 36.0–46.0)
Hemoglobin: 9.4 g/dL — ABNORMAL LOW (ref 12.0–15.0)
MCH: 23.5 pg — ABNORMAL LOW (ref 26.0–34.0)
MCHC: 30.2 g/dL (ref 30.0–36.0)
MCV: 77.8 fL — ABNORMAL LOW (ref 80.0–100.0)
Platelets: 479 10*3/uL — ABNORMAL HIGH (ref 150–400)
RBC: 4 MIL/uL (ref 3.87–5.11)
RDW: 15.9 % — ABNORMAL HIGH (ref 11.5–15.5)
WBC: 7.5 10*3/uL (ref 4.0–10.5)
nRBC: 0 % (ref 0.0–0.2)

## 2022-05-06 LAB — TISSUE TRANSGLUTAMINASE, IGA: Tissue Transglutaminase Ab, IgA: <2 U/mL (ref 0–3)

## 2022-05-06 LAB — IGA: IgA: 372 mg/dL — ABNORMAL HIGH (ref 87–352)

## 2022-05-06 MED ORDER — SODIUM CHLORIDE 0.9 % IV SOLN
250.0000 mg | Freq: Every day | INTRAVENOUS | Status: AC
  Administered 2022-05-06: 250 mg via INTRAVENOUS
  Filled 2022-05-06: qty 20

## 2022-05-06 NOTE — Discharge Instructions (Signed)
Bowel regimen with miralax and other meds

## 2022-05-06 NOTE — Progress Notes (Signed)
Mobility Specialist - Progress Note   05/06/22 1000  Mobility  HOB Elevated/Bed Position Self regulated  Activity Ambulated independently in hallway  Range of Motion/Exercises Active  Level of Assistance Independent  Assistive Device None  Distance Ambulated (ft) 200 ft  Activity Response Tolerated well  Transport method Ambulatory  $Mobility charge 1 Mobility   Pt received in bed. Pt agreeable to mobility. Pt mentioned being wobbly due to having a stroke 61yr ago. Pt left in bed w/ necessities in reach.   [facility]

## 2022-05-06 NOTE — Plan of Care (Signed)
  Problem: Nutrition: Goal: Adequate nutrition will be maintained Outcome: Progressing   Problem: Pain Managment: Goal: General experience of comfort will improve Outcome: Progressing   Problem: Safety: Goal: Ability to remain free from injury will improve Outcome: Progressing   

## 2022-05-06 NOTE — Discharge Summary (Signed)
Physician Discharge Summary  [name] JKK:[id number] DOB: 1960-11-13 DOA: 05/04/2022  PCP: [name], DO  Admit date: 05/04/2022 Discharge date: 05/06/2022  Admitted From: home Discharge disposition: home   Recommendations for Outpatient Follow-Up:   Close follow up with GI re: ERCP   Discharge Diagnosis:   Principal Problem:   Pancreatitis Active Problems:   Pancreatic duct dilated   Constipation   Paroxysmal atrial fibrillation (HCC)   Chronic anticoagulation   Benign essential HTN   Microcytic hypochromic anemia   Thrombocytosis   AAA (abdominal aortic aneurysm) ([place]), infrarenal - ulcerative plaque (3.1cm)- 09/10/2019   Hyperlipidemia LDL goal <70    Discharge Condition: Improved.  Diet recommendation: Low sodium, heart healthy  Wound care: None.  Code status: Full.   History of Present Illness:   HPI: [name] is a 61 y.o. female with medical history significant of hyperlipidemia, CAD, CVA, PAF on, recurrent pancreatitis, infrarenal AAA, who presents with complaints of abdominal pain for the last week.  She has had pain across her abdomen, but most severe in the left lower quadrant.  She had not been able to have a bowel movement over the last week despite using enemas, suppositories, and stool softeners.  She noted associated symptoms of hot/cold flashes, nausea, insomnia,  decreased p.o. intake over the last 2-3 days, and weight loss of approximately 4 pounds.  Previously in 2021 patient reported having several episodes of recurrent pancreatitis for which ultimately she went to [place] in 07/2020 where patient underwent ERCP.  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 Since that time she had been doing well up until here recently.  Patient smokes a couple cigarettes here and there and  rarely drinks alcohol.  Denies having any significant blood in stool/urine, vomiting, or dysuria.  She did have a loose bowel since being here in the hospital.  Patient also reports that she had a colonoscopy sometime last year where she was found to have polyps which were removed and was recommended to have repeat colonoscopy in 3 years.   Patient had been seen by her primary care provider office yesterday morning where labs revealed lipase 2065.  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   On admission to the emergency department patient was admitted to be afebrile with tachycardia and all other vital signs maintained.  Labs yesterday evening significant for WBC 11.7, hemoglobin 10.7, platelets 540, sodium 132, and lipase 614.  Patient was given oxycodone and normal saline IV fluids.  Orders placed for MRCP.     Hospital Course by Problem:   recurrent idiopathic pancreatitis s/p ERCP with CBD and PD sphincterotomies for equivocal SOD findings, s/p cholecystectomy, A-fib on Xarelto, CVA, CAD s/p CABG, HFpEF presented with abdominal, which she has attributed to constipation. CBC with elevated WBC of 11.2 and mild anemia of 11.1. LFTs are nml. Lipase was checked as 2065. CT A/P shows a stable 15 mm hypodense area in the pancreatic head (concerning for pseudocyst), increased inflammatory changes around the pancreatic head suggesting acute pancreatitis, and increased pancreatic ductal dilation concerning for  potential obstruction. MRI/MRCP shows mild diffuse pancreatic ductal dilation, dominant cystic lesion in the pancreatic head, and mild peripancreatic soft tissue stranding that may reflect mild  uncomplicated pancreatitis. Patient denies any signs of GI bleeding. Her abdominal pain has improved after having a BM. Recommend continuing supportive care. Patient may need an EUS/ERCP as an outpatient but this would not be done in the setting of acute pancreatitis.  -outpatient GI follow up with [name] Landmark -patient improved faster than expected and was able to be d/c'd on 8/11   Medical Consultants:   GI   Discharge Exam:   Vitals:   05/06/22 0514 05/06/22 0817  BP: (!) 145/59 (!) 141/93  Pulse: 85 99  Resp: 17 16  Temp: 97.8 F (36.6 C) 98.1 F (36.7 C)  SpO2: 96% 94%   Vitals:   05/05/22 2108 05/06/22 0126 05/06/22 0514 05/06/22 0817  BP: 128/78 125/64 (!) 145/59 (!) 141/93  Pulse: 92 86 85 99  Resp:  '17 17 16  '$ Temp: 97.7 F (36.5 C) 97.6 F (36.4 C) 97.8 F (36.6 C) 98.1 F (36.7 C)  TempSrc: Oral   Oral  SpO2: 94% 96% 96% 94%  Weight:      Height:        General exam: Appears calm and comfortable.     The results of significant diagnostics from this hospitalization (including imaging, microbiology, ancillary and laboratory) are listed below for reference.     Procedures and Diagnostic Studies:   MR ABDOMEN MRCP W WO CONTAST  Result Date: 05/05/2022 CLINICAL DATA:  Idiopathic recurrent acute pancreatitis with previous stent placement. Increasing abdominal pain and constipation for 1 week. EXAM: MRI ABDOMEN WITHOUT AND WITH CONTRAST (INCLUDING MRCP) TECHNIQUE: Multiplanar multisequence MR imaging of the abdomen was performed both before and after the administration of intravenous contrast. Heavily T2-weighted images of the biliary and pancreatic ducts were obtained, and three-dimensional MRCP images were rendered by post processing. CONTRAST:  80m GADAVIST GADOBUTROL 1 MMOL/ML IV SOLN COMPARISON:  Abdominal CT 05/04/2022 and 06/17/2020. Abdominal MRI 07/07/2020. FINDINGS: Despite efforts by the technologist and patient, moderate motion artifact is present on  today's exam and could not be eliminated. This reduces exam sensitivity and specificity. Lower chest:  The visualized lower chest appears unremarkable. Hepatobiliary: The hepatic contours are normal. No signs of cirrhosis or significant steatosis. Stable probable tiny hemangioma within the left hepatic lobe with homogeneous enhancement following contrast. No suspicious liver lesions. No significant biliary dilatation status post cholecystectomy. Pancreas: Compared with the previous MRI, there is mild diffuse pancreatic ductal dilatation, measuring up to 7 mm on image 17/9. There are cystic appearing lesions within the pancreatic head. The more anterior one measuring 15 mm on image 19/9 appears somewhat complex on the T2 weighted images, although shows no enhancement following contrast and has decreased in size from previous MRI. More posterior one measuring 10 mm on image 19/9 appears simple but is new. No evidence of enhancing pancreatic mass, hemorrhage or necrosis. Possible mild peripancreatic soft tissue stranding as suggested on earlier CT. Spleen: Normal in size without focal abnormality. Adrenals/Urinary Tract: Both adrenal glands appear normal. No hydronephrosis or suspicious renal lesion. There are scattered small renal cysts bilaterally which do not require specific imaging follow-up. Stomach/Bowel: The stomach appears unremarkable for its degree of distension. No evidence of bowel wall thickening, distention or surrounding inflammatory change. Vascular/Lymphatic: There are no enlarged abdominal lymph nodes. 3.5 cm infrarenal abdominal aortic aneurysm has mildly enlarged from previous MRI and demonstrates eccentric  mural thrombus. No evidence of retroperitoneal hemorrhage or large vessel occlusion. Other: No evidence of abdominal wall hernia or ascites. Musculoskeletal: No acute or significant osseous findings. IMPRESSION: 1. Interval development of mild diffuse pancreatic ductal dilatation. No evidence of  pancreatic mass or biliary ductal dilatation. 2. Dominant cystic lesion within the pancreatic head has decreased in size from MRI of 2021, probably a pseudocyst. There is a smaller cystic lesion more posteriorly in the pancreatic head which is new. Consider continued surveillance. 3. Mild peripancreatic soft tissue stranding which may reflect mild uncomplicated pancreatitis. 4. Slowly enlarging infrarenal abdominal aortic aneurysm with irregular mural thrombus. Recommend follow-up ultrasound every 2 years. This recommendation follows ACR consensus guidelines: White Paper of the ACR Incidental Findings Committee II on Vascular Findings. J Am Coll Radiol 2013; 10:789-794. Electronically Signed   By: Richardean Sale M.D.   On: 05/05/2022 11:21   MR 3D Recon At Scanner  Result Date: 05/05/2022 CLINICAL DATA:  Idiopathic recurrent acute pancreatitis with previous stent placement. Increasing abdominal pain and constipation for 1 week. EXAM: MRI ABDOMEN WITHOUT AND WITH CONTRAST (INCLUDING MRCP) TECHNIQUE: Multiplanar multisequence MR imaging of the abdomen was performed both before and after the administration of intravenous contrast. Heavily T2-weighted images of the biliary and pancreatic ducts were obtained, and three-dimensional MRCP images were rendered by post processing. CONTRAST:  31m GADAVIST GADOBUTROL 1 MMOL/ML IV SOLN COMPARISON:  Abdominal CT 05/04/2022 and 06/17/2020. Abdominal MRI 07/07/2020. FINDINGS: Despite efforts by the technologist and patient, moderate motion artifact is present on today's exam and could not be eliminated. This reduces exam sensitivity and specificity. Lower chest:  The visualized lower chest appears unremarkable. Hepatobiliary: The hepatic contours are normal. No signs of cirrhosis or significant steatosis. Stable probable tiny hemangioma within the left hepatic lobe with homogeneous enhancement following contrast. No suspicious liver lesions. No significant biliary dilatation  status post cholecystectomy. Pancreas: Compared with the previous MRI, there is mild diffuse pancreatic ductal dilatation, measuring up to 7 mm on image 17/9. There are cystic appearing lesions within the pancreatic head. The more anterior one measuring 15 mm on image 19/9 appears somewhat complex on the T2 weighted images, although shows no enhancement following contrast and has decreased in size from previous MRI. More posterior one measuring 10 mm on image 19/9 appears simple but is new. No evidence of enhancing pancreatic mass, hemorrhage or necrosis. Possible mild peripancreatic soft tissue stranding as suggested on earlier CT. Spleen: Normal in size without focal abnormality. Adrenals/Urinary Tract: Both adrenal glands appear normal. No hydronephrosis or suspicious renal lesion. There are scattered small renal cysts bilaterally which do not require specific imaging follow-up. Stomach/Bowel: The stomach appears unremarkable for its degree of distension. No evidence of bowel wall thickening, distention or surrounding inflammatory change. Vascular/Lymphatic: There are no enlarged abdominal lymph nodes. 3.5 cm infrarenal abdominal aortic aneurysm has mildly enlarged from previous MRI and demonstrates eccentric mural thrombus. No evidence of retroperitoneal hemorrhage or large vessel occlusion. Other: No evidence of abdominal wall hernia or ascites. Musculoskeletal: No acute or significant osseous findings. IMPRESSION: 1. Interval development of mild diffuse pancreatic ductal dilatation. No evidence of pancreatic mass or biliary ductal dilatation. 2. Dominant cystic lesion within the pancreatic head has decreased in size from MRI of 2021, probably a pseudocyst. There is a smaller cystic lesion more posteriorly in the pancreatic head which is new. Consider continued surveillance. 3. Mild peripancreatic soft tissue stranding which may reflect mild uncomplicated pancreatitis. 4. Slowly enlarging infrarenal abdominal  aortic aneurysm  with irregular mural thrombus. Recommend follow-up ultrasound every 2 years. This recommendation follows ACR consensus guidelines: White Paper of the ACR Incidental Findings Committee II on Vascular Findings. J Am Coll Radiol 2013; 10:789-794. Electronically Signed   By: Richardean Sale M.D.   On: 05/05/2022 11:21   CT Abdomen Pelvis W Contrast  Result Date: 05/04/2022 CLINICAL DATA:  Acute generalized abdominal pain and bloating. Constipation. EXAM: CT ABDOMEN AND PELVIS WITH CONTRAST TECHNIQUE: Multidetector CT imaging of the abdomen and pelvis was performed using the standard protocol following bolus administration of intravenous contrast. RADIATION DOSE REDUCTION: This exam was performed according to the departmental dose-optimization program which includes automated exposure control, adjustment of the mA and/or kV according to patient size and/or use of iterative reconstruction technique. CONTRAST:  117m OMNIPAQUE IOHEXOL 300 MG/ML  SOLN COMPARISON:  CT scan of June 17, 2020. MRI of July 07, 2020. FINDINGS: Lower chest: No acute abnormality. Hepatobiliary: No focal liver abnormality is seen. Status post cholecystectomy. No biliary dilatation. 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 However, there appears to be increased pancreatic ductal dilatation in the head and body of the pancreas. There is also noted mildly increased inflammatory changes around the pancreatic head suggesting acute pancreatitis. Spleen: Normal in size without focal abnormality. Adrenals/Urinary Tract: Adrenal glands appear normal. Vascular calcifications are seen involving both kidneys as well as multiple rounded hypodensities bilaterally most consistent with cyst. No hydronephrosis or renal obstruction is noted. Urinary bladder is unremarkable. Stomach/Bowel: Stomach is within normal limits. Appendix  appears normal. No evidence of bowel wall thickening, distention, or inflammatory changes. Vascular/Lymphatic: 3.8 cm infrarenal abdominal aortic aneurysm is noted. No adenopathy is noted. Reproductive: Uterus and bilateral adnexa are unremarkable. Other: No abdominal wall hernia or abnormality. No abdominopelvic ascites. Musculoskeletal: No acute or significant osseous findings. 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 There is noted mildly increased inflammatory changes around the pancreatic head suggesting acute pancreatitis. However, there is also noted increased pancreatic ductal dilatation concerning for potential obstruction. Possible neoplasm cannot be excluded, and follow-up MRI may be obtained for further evaluation. 3.8 cm infrarenal abdominal aortic aneurysm. Recommend follow-up ultrasound every 2 years. This recommendation follows ACR consensus guidelines: White Paper of the ACR Incidental Findings Committee II on Vascular Findings. J Am Coll Radiol 2013; 10:789-794. Aortic Atherosclerosis (ICD10-I70.0). Electronically Signed   By: JMarijo ConceptionM.D.   On: 05/04/2022 15:13     Labs:   Basic Metabolic Panel: Recent Labs  Lab 05/04/22 1038 05/04/22 1805 05/06/22 0116  NA 135 132* 137  K 4.6 3.9 3.6  CL 101 102 108  CO2 22 19* 23  GLUCOSE 98 76 86  BUN '14 13 11  '$ CREATININE 0.68 0.73 0.68  CALCIUM 9.6 9.3 8.5*   GFR Estimated Creatinine Clearance: 55 mL/min (by C-G formula based on SCr of 0.68 mg/dL). Liver Function Tests: Recent Labs  Lab 05/04/22 1038 05/04/22 1805  AST 22 24  ALT 11 14  ALKPHOS 83 71  BILITOT 0.3 0.6  PROT 7.4 7.3  ALBUMIN 4.3 3.7   Recent Labs  Lab 05/04/22 1038 05/04/22 1805  LIPASE 2,065.0* 614*   No results for input(s): "AMMONIA" in the last 168 hours. Coagulation profile No results for input(s): "INR", "PROTIME" in the last 168  hours.  CBC: Recent Labs  Lab  05/04/22 1038 05/04/22 1805 05/06/22 0116  WBC 11.2* 11.7* 7.5  NEUTROABS 8.1* 7.5  --   HGB 11.1* 10.7* 9.4*  HCT 35.5* 34.2* 31.1*  MCV 75.5* 76.5* 77.8*  PLT 564.0* 540* 479*   Cardiac Enzymes: No results for input(s): "CKTOTAL", "CKMB", "CKMBINDEX", "TROPONINI" in the last 168 hours. BNP: Invalid input(s): "POCBNP" CBG: No results for input(s): "GLUCAP" in the last 168 hours. D-Dimer No results for input(s): "DDIMER" in the last 72 hours. Hgb A1c No results for input(s): "HGBA1C" in the last 72 hours. Lipid Profile Recent Labs    05/05/22 0835  TRIG 186*   Thyroid function studies Recent Labs    05/04/22 1038  TSH 2.32   Anemia work up Recent Labs    05/05/22 0856  FERRITIN 14  TIBC 368  IRON 20*   Microbiology No results found for this or any previous visit (from the past 240 hour(s)).   Discharge Instructions:   Discharge Instructions     Diet - low sodium heart healthy   Complete by: As directed    Increase activity slowly   Complete by: As directed       Allergies as of 05/06/2022       Reactions   Hydromorphone Other (See Comments)   Hallucinations Other reaction(s): Hallucination   Lopressor [metoprolol Tartrate] Swelling   Angioedema   Sacubitril-valsartan Swelling, Rash   Welts, possible angioedema Rash   Allegra Allergy [fexofenadine Hcl] Hives   Fexofenadine Hives   Nsaids Other (See Comments)   On Xarelto Not taking d/t abdominal pain   Gabapentin Rash   Gabapentin (once-daily) Rash   Lipitor [atorvastatin] Diarrhea        Medication List     STOP taking these medications    diclofenac Sodium 1 % Gel Commonly known as: VOLTAREN   traMADol 50 MG tablet Commonly known as: ULTRAM       TAKE these medications    calcium carbonate 500 MG chewable tablet Commonly known as: TUMS - dosed in mg elemental calcium Chew 1 tablet by mouth 2 (two) times daily as needed for indigestion or  heartburn.   carvedilol 3.125 MG tablet Commonly known as: COREG 1/2 tab PO BID   cholecalciferol 1000 units tablet Commonly known as: VITAMIN D Take 1,000 Units by mouth daily.   hydrOXYzine 10 MG tablet Commonly known as: ATARAX Take 1 tablet (10 mg total) by mouth at bedtime.   levocetirizine 5 MG tablet Commonly known as: XYZAL Take 1 tablet (5 mg total) by mouth every evening.   pantoprazole 20 MG tablet Commonly known as: PROTONIX Take 1 tablet (20 mg total) by mouth daily.   polyethylene glycol powder 17 GM/SCOOP powder Commonly known as: GLYCOLAX/MIRALAX Take 17 g by mouth 2 (two) times daily as needed. What changed: reasons to take this   pravastatin 20 MG tablet Commonly known as: PRAVACHOL Take 1 tablet (20 mg total) by mouth every evening.   rivaroxaban 20 MG Tabs tablet Commonly known as: Xarelto TAKE 1 TABLET BY MOUTH ONCE DAILY WITH EVENING MEAL   sennosides-docusate sodium 8.6-50 MG tablet Commonly known as: SENOKOT-S Take 2 tablets by mouth daily.   vitamin B-12 100 MCG tablet Commonly known as: CYANOCOBALAMIN Take 100 mcg by mouth daily.        Follow-up Information     [name], [name], DO Follow up in 1 week(s).   Specialty: Family Medicine Contact information: [id number] [place] [address] [phone]  Time coordinating discharge: 45 min  Signed:  Geradine Girt DO  [facility] 05/06/2022, 9:42 AM

## 2022-05-09 ENCOUNTER — Telehealth: Payer: Self-pay

## 2022-05-09 NOTE — Telephone Encounter (Signed)
Transition Care Management Follow-up Telephone Call Date of discharge and from where: 05/06/22 from [facility] How have you been since you were released from the hospital? Pt states she is doing okay she has been scheduled with PCP and GI Any questions or concerns? Yes Pt states that she was told that she was not sick enough to be seen immediately and had to wait 14 hours to be seen at ED  Items Reviewed: Did the pt receive and understand the discharge instructions provided? Yes  Medications obtained and verified? Yes  Other? No  Any new allergies since your discharge? No  Dietary orders reviewed? Yes Do you have support at home? Yes   Home Care and Equipment/Supplies: Were home health services ordered? not applicable If so, what is the name of the agency? N/A  Has the agency set up a time to come to the patient's home? not applicable Were any new equipment or medical supplies ordered?  No What is the name of the medical supply agency? N/A Were you able to get the supplies/equipment? not applicable Do you have any questions related to the use of the equipment or supplies? No  Functional Questionnaire: (I = Independent and D = Dependent) ADLs: I  Bathing/Dressing- I  Meal Prep- I  Eating- I  Maintaining continence- I  Transferring/Ambulation- I  Managing Meds- I  Follow up appointments reviewed:  PCP Hospital f/u appt confirmed? Yes  Scheduled to see [name] on 05/12/22 @ 11 AM. [facility] f/u appt confirmed? Yes  Scheduled to see [facility] on 06/29/22 @ 8:30 AM. Are transportation arrangements needed? No  If their condition worsens, is the pt aware to call PCP or go to the Emergency Dept.? Yes Was the patient provided with contact information for the PCP's office or ED? Yes Was to pt encouraged to call back with questions or concerns? Yes

## 2022-05-12 ENCOUNTER — Encounter: Payer: Self-pay | Admitting: Family Medicine

## 2022-05-12 ENCOUNTER — Ambulatory Visit (INDEPENDENT_AMBULATORY_CARE_PROVIDER_SITE_OTHER): Payer: 59 | Admitting: Family Medicine

## 2022-05-12 VITALS — BP 108/68 | HR 99 | Temp 97.9°F | Ht 61.0 in | Wt 124.0 lb

## 2022-05-12 DIAGNOSIS — G8110 Spastic hemiplegia affecting unspecified side: Secondary | ICD-10-CM

## 2022-05-12 DIAGNOSIS — I10 Essential (primary) hypertension: Secondary | ICD-10-CM | POA: Diagnosis not present

## 2022-05-12 DIAGNOSIS — Z7901 Long term (current) use of anticoagulants: Secondary | ICD-10-CM

## 2022-05-12 DIAGNOSIS — I214 Non-ST elevation (NSTEMI) myocardial infarction: Secondary | ICD-10-CM

## 2022-05-12 DIAGNOSIS — E785 Hyperlipidemia, unspecified: Secondary | ICD-10-CM

## 2022-05-12 DIAGNOSIS — I700 Atherosclerosis of aorta: Secondary | ICD-10-CM

## 2022-05-12 DIAGNOSIS — K861 Other chronic pancreatitis: Secondary | ICD-10-CM

## 2022-05-12 DIAGNOSIS — I63512 Cerebral infarction due to unspecified occlusion or stenosis of left middle cerebral artery: Secondary | ICD-10-CM | POA: Diagnosis not present

## 2022-05-12 DIAGNOSIS — K862 Cyst of pancreas: Secondary | ICD-10-CM | POA: Diagnosis not present

## 2022-05-12 DIAGNOSIS — I480 Paroxysmal atrial fibrillation: Secondary | ICD-10-CM

## 2022-05-12 DIAGNOSIS — K859 Acute pancreatitis without necrosis or infection, unspecified: Secondary | ICD-10-CM

## 2022-05-12 LAB — CBC WITH DIFFERENTIAL/PLATELET
Basophils Absolute: 0.1 10*3/uL (ref 0.0–0.1)
Basophils Relative: 1 % (ref 0.0–3.0)
Eosinophils Absolute: 0.2 10*3/uL (ref 0.0–0.7)
Eosinophils Relative: 2.2 % (ref 0.0–5.0)
HCT: 37.9 % (ref 36.0–46.0)
Hemoglobin: 11.9 g/dL — ABNORMAL LOW (ref 12.0–15.0)
Lymphocytes Relative: 24.8 % (ref 12.0–46.0)
Lymphs Abs: 2.6 10*3/uL (ref 0.7–4.0)
MCHC: 31.3 g/dL (ref 30.0–36.0)
MCV: 79.1 fl (ref 78.0–100.0)
Monocytes Absolute: 1 10*3/uL (ref 0.1–1.0)
Monocytes Relative: 9.5 % (ref 3.0–12.0)
Neutro Abs: 6.7 10*3/uL (ref 1.4–7.7)
Neutrophils Relative %: 62.5 % (ref 43.0–77.0)
Platelets: 573 10*3/uL — ABNORMAL HIGH (ref 150.0–400.0)
RBC: 4.79 Mil/uL (ref 3.87–5.11)
RDW: 17.8 % — ABNORMAL HIGH (ref 11.5–15.5)
WBC: 10.6 10*3/uL — ABNORMAL HIGH (ref 4.0–10.5)

## 2022-05-12 LAB — COMPREHENSIVE METABOLIC PANEL
ALT: 14 U/L (ref 0–35)
AST: 22 U/L (ref 0–37)
Albumin: 4.4 g/dL (ref 3.5–5.2)
Alkaline Phosphatase: 85 U/L (ref 39–117)
BUN: 12 mg/dL (ref 6–23)
CO2: 26 mEq/L (ref 19–32)
Calcium: 10 mg/dL (ref 8.4–10.5)
Chloride: 102 mEq/L (ref 96–112)
Creatinine, Ser: 0.79 mg/dL (ref 0.40–1.20)
GFR: 80.38 mL/min (ref >60.00)
Glucose, Bld: 91 mg/dL (ref 70–99)
Potassium: 4.8 mEq/L (ref 3.5–5.1)
Sodium: 137 mEq/L (ref 135–145)
Total Bilirubin: 0.3 mg/dL (ref 0.2–1.2)
Total Protein: 7.6 g/dL (ref 6.0–8.3)

## 2022-05-12 LAB — LIPASE: Lipase: 277 U/L — ABNORMAL HIGH (ref 11.0–59.0)

## 2022-05-12 MED ORDER — RIVAROXABAN 20 MG PO TABS: ORAL_TABLET | ORAL | 5 refills | Status: DC

## 2022-05-12 MED ORDER — CARVEDILOL 3.125 MG PO TABS
ORAL_TABLET | ORAL | 1 refills | Status: DC
Start: 2022-05-12 — End: 2022-10-26

## 2022-05-12 MED ORDER — PANTOPRAZOLE SODIUM 20 MG PO TBEC: 20.0000 mg | DELAYED_RELEASE_TABLET | Freq: Every day | ORAL | 1 refills | Status: DC

## 2022-05-12 MED ORDER — SENNA-DOCUSATE SODIUM 8.6-50 MG PO TABS: 2.0000 | ORAL_TABLET | Freq: Every day | ORAL | 2 refills | Status: DC

## 2022-05-12 MED ORDER — HYDROXYZINE HCL 10 MG PO TABS: 10.0000 mg | ORAL_TABLET | Freq: Every day | ORAL | 1 refills | Status: DC

## 2022-05-12 NOTE — Progress Notes (Signed)
[name] , May 24, 1961, 61 y.o., female MRN: [id number] Patient Care Team    Relationship Specialty Notifications Start End  [name], DO PCP - General Family Medicine  10/13/17   Constance Haw, MD PCP - Electrophysiology Cardiology Admissions 08/28/17   Satira Sark, MD PCP - Cardiology Cardiology Admissions 12/15/17   Rosalin Hawking, MD Consulting Physician Neurology  10/13/17   Jamse Arn, MD Consulting Physician Physical Medicine and Rehabilitation  10/13/17   Luanne Bras, MD Consulting Physician Interventional Radiology  10/13/17   Mansouraty, Telford Nab., MD Consulting Physician Gastroenterology  03/03/20   Armandina Gemma, MD Consulting Physician General Surgery  04/09/20   Lucas Mallow, MD Consulting Physician Urology  04/09/20     Chief Complaint  Patient presents with   Hypertension    [facility]mc; pt is not fasting     Subjective:  [name]  is a 61 y.o. female presents for [facility] Was seen by this provider last week for acute abdominal pain and was sent to the emergency room secondary to recurrent pancreatitis visualized on CT and lipase greater than 2000.  Patient reports she is feeling improved today.  She still feels very bloated.  Patient reports compliance with Protonix 20 mg daily.  She does have a GI follow-up with [place] but not for quite a few months.  She is concerned the appointment is too far off.  She has seen a provider in [place] as well for her pancreatic cyst/chronic pancreatitis.  H/o NSTEMI (non-ST elevated myocardial infarction) (HCC)/Coronary atherosclerosis of autologous artery bypass graft without angina/Benign essential HTN/Chronic diastolic CHF (congestive heart failure) (HCC)/Atrial fibrillation, chronic (HCC)/Atherosclerosis of aorta (HCC)/HLD Pt reports compliance with Coreg half a tab twice daily, statin and Xarelto. Blood pressures ranges at home are not routinely checked. Patient denies chest pain, shortness of  breath, dizziness or lower extremity edema.    Anxiety Patient reports Vistaril nightly helpful for sleep.   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  She is no longer taking the baclofen.  She still uses the tramadol 50-100 mg before bed, and sometimes in the day if needed for pain.  Pain is secondary to her stroke and her arthritis. Recent Labs  Lab 05/06/22 0116  HGB 9.4*  HCT 31.1*  WBC 7.5  PLT 479*      Latest Ref Rng & Units 05/06/2022    1:16 AM 05/04/2022    6:05 PM 05/04/2022   10:38 AM  CMP  Glucose 70 - 99 mg/dL 86  76  98   BUN 8 - 23 mg/dL _0 Creatinine 0.44 - 1.00 mg/dL 0.68  0.73  0.68   Sodium 135 - 145 mmol/L 137  132  135   Potassium 3.5 - 5.1 mmol/L 3.6  3.9  4.6   Chloride 98 - 111 mmol/L 108  102  101   CO2 22 - 32 mmol/L _1 Calcium 8.9 - 10.3 mg/dL 8.5  9.3  9.6   Total Protein 6.5 - 8.1 g/dL  7.3  7.4   Total Bilirubin 0.3 - 1.2 mg/dL  0.6  0.3   Alkaline Phos 38 - 126 U/L  71  83   AST 15 - 41 U/L  24  22   ALT 0 - 44 U/L  90  11       MR ABDOMEN MRCP W WO CONTAST  Result Date: 05/05/2022 CLINICAL DATA:  Idiopathic recurrent acute pancreatitis with previous stent placement. Increasing abdominal pain and constipation for 1 week. EXAM: MRI ABDOMEN WITHOUT AND WITH CONTRAST (INCLUDING MRCP) TECHNIQUE: Multiplanar multisequence MR imaging of the abdomen was performed both before and after the administration of intravenous contrast. Heavily T2-weighted images of the biliary and pancreatic ducts were obtained, and three-dimensional MRCP images were rendered by post processing. CONTRAST:  53m GADAVIST GADOBUTROL 1 MMOL/ML IV SOLN COMPARISON:  Abdominal CT 05/04/2022 and 06/17/2020. Abdominal MRI 07/07/2020. FINDINGS: Despite efforts by the technologist and patient, moderate  motion artifact is present on today's exam and could not be eliminated. This reduces exam sensitivity and specificity. Lower chest:  The visualized lower chest appears unremarkable. Hepatobiliary: The hepatic contours are normal. No signs of cirrhosis or significant steatosis. Stable probable tiny hemangioma within the left hepatic lobe with homogeneous enhancement following contrast. No suspicious liver lesions. No significant biliary dilatation status post cholecystectomy. Pancreas: Compared with the previous MRI, there is mild diffuse pancreatic ductal dilatation, measuring up to 7 mm on image 17/9. There are cystic appearing lesions within the pancreatic head. The more anterior one measuring 15 mm on image 19/9 appears somewhat complex on the T2 weighted images, although shows no enhancement following contrast and has decreased in size from previous MRI. More posterior one measuring 10 mm on image 19/9 appears simple but is new. No evidence of enhancing pancreatic mass, hemorrhage or necrosis. Possible mild peripancreatic soft tissue stranding as suggested on earlier CT. Spleen: Normal in size without focal abnormality. Adrenals/Urinary Tract: Both adrenal glands appear normal. No hydronephrosis or suspicious renal lesion. There are scattered small renal cysts bilaterally which do not require specific imaging follow-up. Stomach/Bowel: The stomach appears unremarkable for its degree of distension. No evidence of bowel wall thickening, distention or surrounding inflammatory change. Vascular/Lymphatic: There are no enlarged abdominal lymph nodes. 3.5 cm infrarenal abdominal aortic aneurysm has mildly enlarged from previous MRI and demonstrates eccentric mural thrombus. No evidence of retroperitoneal hemorrhage or large vessel occlusion. Other: No evidence of abdominal wall hernia or ascites. Musculoskeletal: No acute or significant osseous findings. IMPRESSION: 1. Interval development of mild diffuse pancreatic  ductal dilatation. No evidence of pancreatic mass or biliary ductal dilatation. 2. Dominant cystic lesion within the pancreatic head has decreased in size from MRI of 2021, probably a pseudocyst. There is a smaller cystic lesion more posteriorly in the pancreatic head which is new. Consider continued surveillance. 3. Mild peripancreatic soft tissue stranding which may reflect mild uncomplicated pancreatitis. 4. Slowly enlarging infrarenal abdominal aortic aneurysm with irregular mural thrombus. Recommend follow-up ultrasound every 2 years. This recommendation follows ACR consensus guidelines: White Paper of the ACR Incidental Findings Committee II on Vascular Findings. J Am Coll Radiol 2013; 10:789-794. Electronically Signed   By: WRichardean SaleM.D.   On: 05/05/2022 11:21   MR 3D Recon At Scanner  Result Date: 05/05/2022 CLINICAL DATA:  Idiopathic recurrent acute pancreatitis with previous stent placement. Increasing abdominal pain and constipation for 1 week. EXAM: MRI ABDOMEN WITHOUT AND WITH CONTRAST (INCLUDING MRCP) TECHNIQUE: Multiplanar multisequence MR imaging of the abdomen was performed both before and after the administration of intravenous contrast. Heavily T2-weighted images of the biliary and pancreatic ducts were obtained, and three-dimensional MRCP images were rendered by post processing. CONTRAST:  639mGADAVIST GADOBUTROL 1 MMOL/ML IV SOLN COMPARISON:  Abdominal CT 05/04/2022 and 06/17/2020. Abdominal  MRI 07/07/2020. FINDINGS: Despite efforts by the technologist and patient, moderate motion artifact is present on today's exam and could not be eliminated. This reduces exam sensitivity and specificity. Lower chest:  The visualized lower chest appears unremarkable. Hepatobiliary: The hepatic contours are normal. No signs of cirrhosis or significant steatosis. Stable probable tiny hemangioma within the left hepatic lobe with homogeneous enhancement following contrast. No suspicious liver lesions.  No significant biliary dilatation status post cholecystectomy. Pancreas: Compared with the previous MRI, there is mild diffuse pancreatic ductal dilatation, measuring up to 7 mm on image 17/9. There are cystic appearing lesions within the pancreatic head. The more anterior one measuring 15 mm on image 19/9 appears somewhat complex on the T2 weighted images, although shows no enhancement following contrast and has decreased in size from previous MRI. More posterior one measuring 10 mm on image 19/9 appears simple but is new. No evidence of enhancing pancreatic mass, hemorrhage or necrosis. Possible mild peripancreatic soft tissue stranding as suggested on earlier CT. Spleen: Normal in size without focal abnormality. Adrenals/Urinary Tract: Both adrenal glands appear normal. No hydronephrosis or suspicious renal lesion. There are scattered small renal cysts bilaterally which do not require specific imaging follow-up. Stomach/Bowel: The stomach appears unremarkable for its degree of distension. No evidence of bowel wall thickening, distention or surrounding inflammatory change. Vascular/Lymphatic: There are no enlarged abdominal lymph nodes. 3.5 cm infrarenal abdominal aortic aneurysm has mildly enlarged from previous MRI and demonstrates eccentric mural thrombus. No evidence of retroperitoneal hemorrhage or large vessel occlusion. Other: No evidence of abdominal wall hernia or ascites. Musculoskeletal: No acute or significant osseous findings. IMPRESSION: 1. Interval development of mild diffuse pancreatic ductal dilatation. No evidence of pancreatic mass or biliary ductal dilatation. 2. Dominant cystic lesion within the pancreatic head has decreased in size from MRI of 2021, probably a pseudocyst. There is a smaller cystic lesion more posteriorly in the pancreatic head which is new. Consider continued surveillance. 3. Mild peripancreatic soft tissue stranding which may reflect mild uncomplicated pancreatitis. 4.  Slowly enlarging infrarenal abdominal aortic aneurysm with irregular mural thrombus. Recommend follow-up ultrasound every 2 years. This recommendation follows ACR consensus guidelines: White Paper of the ACR Incidental Findings Committee II on Vascular Findings. J Am Coll Radiol 2013; 10:789-794. Electronically Signed   By: Richardean Sale M.D.   On: 05/05/2022 11:21   CT Abdomen Pelvis W Contrast  Result Date: 05/04/2022 CLINICAL DATA:  Acute generalized abdominal pain and bloating. Constipation. EXAM: CT ABDOMEN AND PELVIS WITH CONTRAST TECHNIQUE: Multidetector CT imaging of the abdomen and pelvis was performed using the standard protocol following bolus administration of intravenous contrast. RADIATION DOSE REDUCTION: This exam was performed according to the departmental dose-optimization program which includes automated exposure control, adjustment of the mA and/or kV according to patient size and/or use of iterative reconstruction technique. CONTRAST:  160m OMNIPAQUE IOHEXOL 300 MG/ML  SOLN COMPARISON:  CT scan of June 17, 2020. MRI of July 07, 2020. FINDINGS: Lower chest: No acute abnormality. Hepatobiliary: No focal liver abnormality is seen. Status post cholecystectomy. No biliary dilatation. 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 However, there appears to be increased pancreatic ductal dilatation in the head and body of the pancreas. There is also noted mildly increased inflammatory changes around the pancreatic head suggesting acute pancreatitis. Spleen: Normal in size without focal abnormality. Adrenals/Urinary Tract: Adrenal glands appear normal. Vascular calcifications are seen involving both kidneys  as well as multiple rounded hypodensities bilaterally most consistent with cyst. No hydronephrosis or renal obstruction is noted. Urinary bladder is unremarkable. Stomach/Bowel: Stomach  is within normal limits. Appendix appears normal. No evidence of bowel wall thickening, distention, or inflammatory changes. Vascular/Lymphatic: 3.8 cm infrarenal abdominal aortic aneurysm is noted. No adenopathy is noted. Reproductive: Uterus and bilateral adnexa are unremarkable. Other: No abdominal wall hernia or abnormality. No abdominopelvic ascites. Musculoskeletal: No acute or significant osseous findings. 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 There is noted mildly increased inflammatory changes around the pancreatic head suggesting acute pancreatitis. However, there is also noted increased pancreatic ductal dilatation concerning for potential obstruction. Possible neoplasm cannot be excluded, and follow-up MRI may be obtained for further evaluation. 3.8 cm infrarenal abdominal aortic aneurysm. Recommend follow-up ultrasound every 2 years. This recommendation follows ACR consensus guidelines: White Paper of the ACR Incidental Findings Committee II on Vascular Findings. J Am Coll Radiol 2013; 10:789-794. Aortic Atherosclerosis (ICD10-I70.0). Electronically Signed   By: Marijo Conception M.D.   On: 05/04/2022 15:13        11/25/2021   10:38 AM 10/21/2021    2:17 PM 09/29/2021    2:33 PM 08/17/2021   12:40 PM 07/13/2020   11:04 AM  Depression screen PHQ 2/9  Decreased Interest 2 0 0 0 0  Down, Depressed, Hopeless 0 0 0 0 0  PHQ - 2 Score 2 0 0 0 0  Altered sleeping 2      Tired, decreased energy 3      Change in appetite 0      Feeling bad or failure about yourself  0      Trouble concentrating 0      Moving slowly or fidgety/restless 0      Suicidal thoughts 0      PHQ-9 Score 7        Allergies  Allergen Reactions   Hydromorphone Other (See Comments)    Hallucinations Other reaction(s): Hallucination   Lopressor [Metoprolol Tartrate] Swelling    Angioedema    Sacubitril-Valsartan Swelling and Rash    Welts, possible angioedema  Rash   Allegra Allergy [Fexofenadine Hcl] Hives   Fexofenadine Hives   Nsaids Other (See Comments)    On Xarelto Not taking d/t abdominal pain   Gabapentin Rash   Gabapentin (Once-Daily) Rash   Lipitor [Atorvastatin] Diarrhea   Social History   Tobacco Use   Smoking status: Some Days    Packs/day: 0.10    Years: 37.00    Total pack years: 3.70    Types: Cigarettes   Smokeless tobacco: Never  Substance Use Topics   Alcohol use: No   Past Medical History:  Diagnosis Date   AAA (abdominal aortic aneurysm) (HCC)    a. 3.3cm infrarenal by CT 02/2020.   Abnormal findings on endoscopic ultrasound (EUS) 01/25/2020   Abnormality present on pathology (Atypical Pathology) 01/25/2020   Allergy    Angio-edema    Angioedema    Arthritis    CAD (coronary artery disease)    a. s/p NSTEMI in 10/2017 with cath showing LM disease --> s/p CABG on 11/23/2017 with LIMA-LAD and SVG-OM.    Carotid stenosis    a. Duplex 11/2018 7-12% RICA, 45-80% LICA.   Closed nondisplaced fracture of greater trochanter of right femur ([place]) 04/09/2020   Cystocele, unspecified  03/03/2020   Gallbladder sludge and cholelithiasis without cholecystitis 01/25/2020  Small stones of the gallbladder appreciated on CT 02/2020 as well.   Gastritis and duodenitis 12/06/2019   11/2019 Peptic duodenitis  -  No dysplasia or malignancy identified  B. STOMACH, BIOPSY:  -  Mild chronic gastritis with intestinal metaplasia    Gastroesophageal reflux disease 02/26/2021   Hay fever    History of kidney stones    Hyperlipidemia    Idiopathic recurrent acute pancreatitis 01/25/2020   Ischemic cardiomyopathy    a. EF 35-40% by echo in 10/2017. b. 55% by echo 01/2020.   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   Stroke ([place]) 08/23/2017   Left MCA infarct status post TPA and thrombectomy   Urticaria    Past Surgical History:  Procedure Laterality Date   BIOPSY  12/02/2019   Procedure: BIOPSY;  Surgeon: Irving Copas., MD;  Location: [place];  Service: Gastroenterology;;   CHOLECYSTECTOMY N/A 04/30/2020   Procedure: LAPAROSCOPIC CHOLECYSTECTOMY WITH INTRAOPERATIVE CHOLANGIOGRAM;  Surgeon: Armandina Gemma, MD;  Location: [facility];  Service: General;  Laterality: N/A;   CORONARY ARTERY BYPASS GRAFT N/A 11/23/2017   Procedure: CORONARY ARTERY BYPASS GRAFTING (CABG) x 2, ON PUMP, USING LEFT INTERNAL MAMMARY ARTERY AND RIGHT GREATER SAPHENOUS VEIN HARVESTED ENDOSCOPICALLY;  Surgeon: Gaye Pollack, MD;  Location: [place];  Service: Open Heart Surgery;  Laterality: N/A;   CRYOABLATION     cervical   ESOPHAGOGASTRODUODENOSCOPY (EGD) WITH PROPOFOL N/A 12/02/2019   Procedure: ESOPHAGOGASTRODUODENOSCOPY (EGD) WITH PROPOFOL;  Surgeon: Rush Landmark Telford Nab., MD;  Location: [place];  Service: Gastroenterology;  Laterality: N/A;   ESOPHAGOGASTRODUODENOSCOPY (EGD) WITH PROPOFOL N/A 03/19/2020   Procedure: ESOPHAGOGASTRODUODENOSCOPY (EGD) WITH PROPOFOL;  Surgeon: Milus Banister, MD;  [facility];  Service: Endoscopy;  Laterality: N/A;   EUS N/A 03/19/2020   Procedure: UPPER ENDOSCOPIC ULTRASOUND (EUS) RADIAL;  Surgeon: Milus Banister, MD;  [facility];  Service: Endoscopy;  Laterality: N/A;   EUS N/A 03/19/2020   Procedure: UPPER ENDOSCOPIC ULTRASOUND (EUS) LINEAR;  Surgeon: Milus Banister, MD;  [facility];  Service: Endoscopy;  Laterality: N/A;   FINE NEEDLE ASPIRATION  12/02/2019   Procedure: FINE NEEDLE ASPIRATION (FNA) LINEAR;  Surgeon: Irving Copas., MD;  Location: [place];  Service: Gastroenterology;;   FINE NEEDLE  ASPIRATION N/A 03/19/2020   Procedure: FINE NEEDLE ASPIRATION (FNA) LINEAR;  Surgeon: Milus Banister, MD;  [facility];  Service: Endoscopy;  Laterality: N/A;   IR ANGIO INTRA EXTRACRAN SEL COM CAROTID INNOMINATE UNI R MOD SED  08/21/2017   IR ANGIO VERTEBRAL SEL SUBCLAVIAN INNOMINATE UNI R MOD SED  08/21/2017   IR PERCUTANEOUS ART THROMBECTOMY/INFUSION INTRACRANIAL INC DIAG ANGIO  08/21/2017   IR RADIOLOGIST EVAL & MGMT  10/30/2017   LOOP RECORDER INSERTION N/A 08/28/2017   Procedure: LOOP RECORDER INSERTION;  Surgeon: Constance Haw, MD;  Location: [place] [facility];  Service: Cardiovascular;  Laterality: N/A;   RADIOLOGY WITH ANESTHESIA N/A 08/21/2017   Procedure: RADIOLOGY WITH ANESTHESIA;  Surgeon: Luanne Bras, MD;  Location: [place];  Service: Radiology;  Laterality: N/A;   RIGHT/LEFT HEART CATH AND CORONARY ANGIOGRAPHY N/A 11/20/2017   Procedure: RIGHT/LEFT HEART CATH AND CORONARY ANGIOGRAPHY;  Surgeon:  Jettie Booze, MD;  Location: [place] [facility];  Service: Cardiovascular;  Laterality: N/A;   TEE WITHOUT CARDIOVERSION N/A 08/28/2017   Procedure: TRANSESOPHAGEAL ECHOCARDIOGRAM (TEE);  Surgeon: Fay Records, MD;  Location: [place];  Service: Cardiovascular;  Laterality: N/A;   TEE WITHOUT CARDIOVERSION N/A 11/23/2017   Procedure: TRANSESOPHAGEAL ECHOCARDIOGRAM (TEE);  Surgeon: Gaye Pollack, MD;  Location: [place];  Service: Open Heart Surgery;  Laterality: N/A;   TUBAL LIGATION     UPPER ESOPHAGEAL ENDOSCOPIC ULTRASOUND (EUS) N/A 12/02/2019   Procedure: UPPER ESOPHAGEAL ENDOSCOPIC ULTRASOUND (EUS);  Surgeon: Irving Copas., MD;  Location: [place];  Service: Gastroenterology;  Laterality: N/A;   Family History  Problem Relation Age of Onset   Colon polyps Mother    Hypertension Mother    Hyperlipidemia Mother    Diabetes type II Mother    Urticaria Mother    Heart attack Father    Hypertension Brother    Stomach cancer Maternal  Grandfather    Breast cancer Cousin 46   Immunodeficiency Neg Hx    Eczema Neg Hx    Atopy Neg Hx    Asthma Neg Hx    Angioedema Neg Hx    Allergic rhinitis Neg Hx    Colon cancer Neg Hx    Esophageal cancer Neg Hx    Pancreatic cancer Neg Hx    Esophageal varices Neg Hx    Inflammatory bowel disease Neg Hx    Rectal cancer Neg Hx    Allergies as of 05/12/2022       Reactions   Hydromorphone Other (See Comments)   Hallucinations Other reaction(s): Hallucination   Lopressor [metoprolol Tartrate] Swelling   Angioedema   Sacubitril-valsartan Swelling, Rash   Welts, possible angioedema Rash   Allegra Allergy [fexofenadine Hcl] Hives   Fexofenadine Hives   Nsaids Other (See Comments)   On Xarelto Not taking d/t abdominal pain   Gabapentin Rash   Gabapentin (once-daily) Rash   Lipitor [atorvastatin] Diarrhea        Medication List        Accurate as of May 12, 2022  3:41 PM. If you have any questions, ask your nurse or doctor.          calcium carbonate 500 MG chewable tablet Commonly known as: TUMS - dosed in mg elemental calcium Chew 1 tablet by mouth 2 (two) times daily as needed for indigestion or heartburn.   carvedilol 3.125 MG tablet Commonly known as: COREG 1/2 tab PO BID   cholecalciferol 1000 units tablet Commonly known as: VITAMIN D Take 1,000 Units by mouth daily.   hydrOXYzine 10 MG tablet Commonly known as: ATARAX Take 1 tablet (10 mg total) by mouth at bedtime.   levocetirizine 5 MG tablet Commonly known as: XYZAL Take 1 tablet (5 mg total) by mouth every evening.   pantoprazole 20 MG tablet Commonly known as: PROTONIX Take 1 tablet (20 mg total) by mouth daily.   polyethylene glycol powder 17 GM/SCOOP powder Commonly known as: GLYCOLAX/MIRALAX Take 17 g by mouth 2 (two) times daily as needed. What changed: reasons to take this   pravastatin 20 MG tablet Commonly known as: PRAVACHOL Take 1 tablet (20 mg total) by mouth every  evening.   rivaroxaban 20 MG Tabs tablet Commonly known as: Xarelto TAKE 1 TABLET BY MOUTH ONCE DAILY WITH EVENING MEAL   sennosides-docusate sodium 8.6-50 MG tablet Commonly known as: SENOKOT-S Take 2 tablets by mouth daily.   vitamin B-12  100 MCG tablet Commonly known as: CYANOCOBALAMIN Take 100 mcg by mouth daily.        All past medical history, surgical history, allergies, family history, immunizations and medications were updated in the EMR today and reviewed under the history and medication portions of their EMR.      ROS: Negative, with the exception of above mentioned in HPI   Objective:  BP 108/68   Pulse 99   Temp 97.9 F (36.6 C) (Oral)   Ht _0  (1.549 m)   Wt 124 lb (56.2 kg)   LMP  (LMP Unknown) Comment: at age 44  SpO2 98%   BMI 23.43 kg/m  Body mass index is 23.43 kg/m. Physical Exam Vitals and nursing note reviewed.  Constitutional:      General: She is not in acute distress.    Appearance: Normal appearance. She is not ill-appearing, toxic-appearing or diaphoretic.  HENT:     Head: Normocephalic and atraumatic.     Mouth/Throat:     Mouth: Mucous membranes are moist.  Eyes:     General: No scleral icterus.       Right eye: No discharge.        Left eye: No discharge.     Extraocular Movements: Extraocular movements intact.     Conjunctiva/sclera: Conjunctivae normal.     Pupils: Pupils are equal, round, and reactive to light.  Cardiovascular:     Rate and Rhythm: Normal rate and regular rhythm.  Pulmonary:     Effort: Pulmonary effort is normal. No respiratory distress.     Breath sounds: Normal breath sounds. No wheezing, rhonchi or rales.  Abdominal:     General: Abdomen is flat. Bowel sounds are normal. There is distension.     Palpations: Abdomen is soft. There is no mass.     Tenderness: There is abdominal tenderness. There is no guarding or rebound.     Hernia: No hernia is present.  Musculoskeletal:     Cervical back: Neck  supple. No tenderness.     Right lower leg: No edema.     Left lower leg: No edema.  Lymphadenopathy:     Cervical: No cervical adenopathy.  Skin:    General: Skin is warm and dry.     Coloration: Skin is not jaundiced or pale.     Findings: No erythema or rash.  Neurological:     Mental Status: She is alert and oriented to person, place, and time. Mental status is at baseline.     Motor: No weakness.     Gait: Gait normal.  Psychiatric:        Mood and Affect: Mood normal.        Behavior: Behavior normal.        Thought Content: Thought content normal.        Judgment: Judgment normal.    Assessment/Plan: [name] is a 61 y.o. 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 Continue routine follow-ups with cardiology. 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  Pancreatic cyst/Chronic recurrent pancreatitis (HCC) C BC, CMP and lipase collected today. - Lipase - Ambulatory referral to Gastroenterology-[facility] -Placed referral back to [facility] in the event they would like  to see her back since she has had another occurrence of pancreatitis.  With [facility] can move up her appointment and she would rather see them, she certainly can.  However she should be seen for follow-up within the next couple weeks.  Reviewed expectations re: course of current medical issues. Discussed self-management of symptoms. Outlined signs and symptoms indicating need for more acute intervention. Patient verbalized understanding and all questions were answered. Patient received an After-Visit Summary. Any changes in medications were reviewed and patient was provided with updated med list with their AVS.     Orders Placed This Encounter  Procedures   Lipase   Comp Met (CMET)   CBC w/Diff   Ambulatory referral to Gastroenterology     Note is dictated utilizing voice recognition software. Although note has been proof read prior to signing, occasional typographical errors still can be missed. If any questions arise, please do not hesitate to call for verification.   electronically signed by:  Howard Pouch, DO  [place]

## 2022-05-13 ENCOUNTER — Telehealth: Payer: Self-pay | Admitting: Family Medicine

## 2022-05-13 NOTE — Telephone Encounter (Signed)
LM for pt to return call to discuss.  

## 2022-05-13 NOTE — Telephone Encounter (Signed)
Please inform [name]: Her labs are improving. Lipase is still elevated, but lower than at her hospital discharge and returning to normal at 277. Blood cell counts are about the same.  I would encourage her to increase her hydration.  Her blood work looks a little concentrated and being even mildly dehydrated can cause pancreatic inflammation.  I also placed the gastroenterology referral to the provider she seen at [facility] before urgently.  I do believe she needs to be seen before October, which is when [facility] was scheduling her.  I would encourage her to keep the [place] appointment in the event we cannot get her into [facility] can quickly, and to call [place] office and see if they can move her appointment up.

## 2022-05-16 ENCOUNTER — Telehealth: Payer: Self-pay | Admitting: Gastroenterology

## 2022-05-16 NOTE — Telephone Encounter (Signed)
Spoke with pt regarding labs and instructions.   

## 2022-05-16 NOTE — Telephone Encounter (Signed)
Returned pt call- the pt states that she is not in severe pain but would like to be seen sooner for constipation.  She is taking miralax, enemas, and stool softeners as well as following the recommendations given to her while hospitalized.  She is drinking "a lot" of water.  She was hospitalized for recurrent pancreatitis. I offered to mover her appt sooner with app but pt declined stating she only wants to see Dr Rush Landmark.  She states she will call and see if she can be seen sooner at [facility] since she was seen at that office as well.  She will call back if she decides she would like to see an app.

## 2022-05-16 NOTE — Telephone Encounter (Signed)
Patient called, states she is in severe pain. Requesting a call back as soon as possible.

## 2022-05-17 ENCOUNTER — Telehealth: Payer: Self-pay | Admitting: Family Medicine

## 2022-05-17 NOTE — Telephone Encounter (Signed)
Please inform patient:  - All patients must be seen if they are requesting treatment/medications of any kind in order to provide safe-appropriate treatment for the patient and needed documentation to file with insurance.  It is not good medical practice and leads to an increase in misdiagnosis and medication overuse or mistakes.  - We hope you now understand the need for [place] to see you in the office. We care about your safety and want to provide you with excellent care.

## 2022-05-17 NOTE — Telephone Encounter (Signed)
Pt called and asked if Dr. Raoul Pitch could send her in a medication for a "head cold" she is very stuffy. She declined appointment and virtual visit as well. She stated thanks for nothing.

## 2022-05-17 NOTE — Telephone Encounter (Signed)
Please advise 

## 2022-06-06 ENCOUNTER — Other Ambulatory Visit: Payer: Self-pay | Admitting: Family Medicine

## 2022-06-09 ENCOUNTER — Other Ambulatory Visit: Payer: Self-pay | Admitting: Family Medicine

## 2022-06-29 ENCOUNTER — Encounter: Payer: Self-pay | Admitting: Gastroenterology

## 2022-06-29 ENCOUNTER — Ambulatory Visit (INDEPENDENT_AMBULATORY_CARE_PROVIDER_SITE_OTHER): Payer: 59 | Admitting: Gastroenterology

## 2022-06-29 VITALS — BP 98/67 | HR 88 | Ht 61.0 in | Wt 123.0 lb

## 2022-06-29 DIAGNOSIS — R194 Change in bowel habit: Secondary | ICD-10-CM

## 2022-06-29 DIAGNOSIS — K8689 Other specified diseases of pancreas: Secondary | ICD-10-CM | POA: Diagnosis not present

## 2022-06-29 DIAGNOSIS — K5900 Constipation, unspecified: Secondary | ICD-10-CM | POA: Diagnosis not present

## 2022-06-29 DIAGNOSIS — Z9889 Other specified postprocedural states: Secondary | ICD-10-CM

## 2022-06-29 DIAGNOSIS — K862 Cyst of pancreas: Secondary | ICD-10-CM

## 2022-06-29 DIAGNOSIS — K834 Spasm of sphincter of Oddi: Secondary | ICD-10-CM

## 2022-06-29 MED ORDER — TRAMADOL HCL 50 MG PO TABS: 50.0000 mg | ORAL_TABLET | Freq: Two times a day (BID) | ORAL | 0 refills | Status: DC

## 2022-06-29 NOTE — Progress Notes (Unsigned)
[facility] VISIT   Primary Care Provider [name], [place] [address] [place] [address] [phone]  Patient Profile: [name] is a 61 y.o. female with a pmh significant for status post CVA, atrial fibrillation (on anticoagulation), CAD status post CABG, ischemic cardiomyopathy, recurrent idiopathic acute pancreatitis (status post ERCP with CBD and PD sphincterotomies for equivocal SOD findings on manometry), status post cholecystectomy, pancreatic cyst, GERD.  The patient presents to the [facility] for an evaluation and management of problem(s) noted below:  Problem List No diagnosis found.   History of Present Illness Please see prior notes for full details of HPI.  Interval History Today, and the patient returns to the GI clinic of [place] after not being seen for almost a year.  She previously saw Dr. Bryan Lemma but has decided to transition to the [facility] office and wanted to follow-up with me.  The patient's history is outlined in her chart was significant for multiple episodes of acute recurrent pancreatitis and a smoldering pancreatitis.  Eventually when functional disorder of the sphincter was concerned and we ended up having her be evaluated at [facility] advanced endoscopy clinic where in which she underwent high risk ERCP with manometry.  She had equivocal findings for sphincter of Oddi and in the setting of her clinical history and dilated bile ducts and elevated pancreatic enzymes underwent both CBD and PD sphincterotomies.  She had a pancreatic stent placed for prophylaxis and then did not follow-up with [facility].  When she decided to follow-up with [place] here we had her obtain a KUB and the pancreas stent is no longer present.  The patient is significantly better than when I had last seen her over a year ago.  She has not had any further episodes of pancreatitis.  Her issues of anorexia and decreased appetite nausea and  vomiting as well as heartburn symptoms significantly have improved since she has not had episodes of pancreatitis.  She is actually been gaining weight over the course of the last year.  Her bowel movements are normal on a daily basis.  She has not noted any rectal bleeding.  She has never had colon cancer screening and this was recommended by her PCP and she wanted to have evaluation for that.  Over the course of the last year she has also improved significantly in regards to improvements after her stroke.  She remains on blood thinners.   GI Review of Systems Positive as above Negative for dysphagia, odynophagia, pyrosis, alteration of bowel habits, pain, melena, hematochezia   Review of Systems General: Denies fevers/chills/unintentional weight loss Cardiovascular: Denies chest pain/palpitations Pulmonary: Denies shortness of breath Gastroenterological: See HPI Genitourinary: Denies darkened urine Hematological: Positive for easy bruising/bleeding due to anticoagulation Dermatological: Denies jaundice Psychological: Mood is stable   Medications Current Outpatient Medications  Medication Sig Dispense Refill   calcium carbonate (TUMS - DOSED IN MG ELEMENTAL CALCIUM) 500 MG chewable tablet Chew 1 tablet by mouth 2 (two) times daily as needed for indigestion or heartburn.      carvedilol (COREG) 3.125 MG tablet 1/2 tab PO BID 90 tablet 1   cholecalciferol (VITAMIN D) 1000 units tablet Take 1,000 Units by mouth daily.     hydrOXYzine (ATARAX) 10 MG tablet Take 1 tablet (10 mg total) by mouth at bedtime. 90 tablet 1   levocetirizine (XYZAL) 5 MG tablet Take 1 tablet (5 mg total) by mouth every evening. 90 tablet 3   pantoprazole (PROTONIX) 20 MG  tablet Take 1 tablet (20 mg total) by mouth daily. 90 tablet 1   polyethylene glycol powder (GLYCOLAX/MIRALAX) 17 GM/SCOOP powder Take 17 g by mouth 2 (two) times daily as needed. (Patient taking differently: Take 17 g by mouth 2 (two) times daily as  needed for mild constipation. Pt taking 1 a week) 3350 g 1   pravastatin (PRAVACHOL) 20 MG tablet Take 1 tablet (20 mg total) by mouth every evening. 90 tablet 3   rivaroxaban (XARELTO) 20 MG TABS tablet TAKE 1 TABLET BY MOUTH ONCE DAILY WITH EVENING MEAL 30 tablet 5   sennosides-docusate sodium (SENOKOT-S) 8.6-50 MG tablet Take 2 tablets by mouth daily. (Patient taking differently: Take 2 tablets by mouth daily. Pt taking 2 at nig) 60 tablet 2   vitamin B-12 (CYANOCOBALAMIN) 100 MCG tablet Take 100 mcg by mouth daily.     No current facility-administered medications for this visit.    Allergies Allergies  Allergen Reactions   Hydromorphone Other (See Comments)    Hallucinations Other reaction(s): Hallucination   Lopressor [Metoprolol Tartrate] Swelling    Angioedema   Sacubitril-Valsartan Swelling and Rash    Welts, possible angioedema  Rash   Allegra Allergy [Fexofenadine Hcl] Hives   Fexofenadine Hives   Nsaids Other (See Comments)    On Xarelto Not taking d/t abdominal pain   Gabapentin Rash   Gabapentin (Once-Daily) Rash   Lipitor [Atorvastatin] Diarrhea    Histories Past Medical History:  Diagnosis Date   AAA (abdominal aortic aneurysm) (HCC)    a. 3.3cm infrarenal by CT 02/2020.   Abnormal findings on endoscopic ultrasound (EUS) 01/25/2020   Abnormality present on pathology (Atypical Pathology) 01/25/2020   Allergy    Angio-edema    Angioedema    Arthritis    CAD (coronary artery disease)    a. s/p NSTEMI in 10/2017 with cath showing LM disease --> s/p CABG on 11/23/2017 with LIMA-LAD and SVG-OM.    Carotid stenosis    a. Duplex 11/2018 0-27% RICA, 74-12% LICA.   Closed nondisplaced fracture of greater trochanter of right femur ([place]) 04/09/2020   Cystocele, unspecified  03/03/2020   Gallbladder sludge and cholelithiasis without cholecystitis 01/25/2020   Small stones of the gallbladder appreciated on CT 02/2020 as well.   Gastritis and duodenitis 12/06/2019    11/2019 Peptic duodenitis  -  No dysplasia or malignancy identified  B. STOMACH, BIOPSY:  -  Mild chronic gastritis with intestinal metaplasia    Gastroesophageal reflux disease 02/26/2021   Hay fever    History of kidney stones    Hyperlipidemia    Idiopathic recurrent acute pancreatitis 01/25/2020   Ischemic cardiomyopathy    a. EF 35-40% by echo in 10/2017. b. 55% by echo 01/2020.   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   Stroke ([place]) 08/23/2017   Left MCA infarct status post TPA and thrombectomy   Urticaria    Past Surgical History:  Procedure Laterality Date   BIOPSY  12/02/2019   Procedure: BIOPSY;  Surgeon: Irving Copas., MD;  Location: [facility];  Service: Gastroenterology;;   CHOLECYSTECTOMY N/A 04/30/2020   Procedure: LAPAROSCOPIC CHOLECYSTECTOMY WITH INTRAOPERATIVE CHOLANGIOGRAM;  Surgeon: Armandina Gemma, MD;  Location: [facility];  Service: General;  Laterality: N/A;   CORONARY ARTERY BYPASS GRAFT N/A 11/23/2017   Procedure: CORONARY ARTERY BYPASS GRAFTING (CABG) x 2, ON PUMP, USING LEFT INTERNAL MAMMARY ARTERY AND RIGHT GREATER SAPHENOUS VEIN HARVESTED ENDOSCOPICALLY;  Surgeon: Gaye Pollack, MD;  Location: [facility];  Service: Open Heart Surgery;  Laterality: N/A;   CRYOABLATION     cervical   ESOPHAGOGASTRODUODENOSCOPY (EGD) WITH PROPOFOL N/A 12/02/2019   Procedure: ESOPHAGOGASTRODUODENOSCOPY (EGD) WITH PROPOFOL;  Surgeon: Rush Landmark Telford Nab., MD;  Location: [facility];  Service: Gastroenterology;  Laterality: N/A;   ESOPHAGOGASTRODUODENOSCOPY (EGD) WITH PROPOFOL N/A 03/19/2020   Procedure: ESOPHAGOGASTRODUODENOSCOPY (EGD) WITH PROPOFOL;  Surgeon: Milus Banister, MD;  [facility];  Service: Endoscopy;  Laterality: N/A;   EUS N/A 03/19/2020   Procedure: UPPER ENDOSCOPIC ULTRASOUND (EUS) RADIAL;  Surgeon: Milus Banister, MD;  [facility];  Service: Endoscopy;  Laterality: N/A;   EUS N/A 03/19/2020   Procedure: UPPER ENDOSCOPIC ULTRASOUND (EUS) LINEAR;  Surgeon: Milus Banister, MD;  [facility];  Service: Endoscopy;  Laterality: N/A;   FINE NEEDLE ASPIRATION  12/02/2019   Procedure: FINE NEEDLE ASPIRATION (FNA) LINEAR;  Surgeon: Irving Copas., MD;  Location: [place];  Service: Gastroenterology;;   FINE NEEDLE ASPIRATION N/A 03/19/2020   Procedure: FINE NEEDLE ASPIRATION (FNA) LINEAR;  Surgeon: Milus Banister, MD;  [facility];  Service: Endoscopy;  Laterality: N/A;   IR ANGIO INTRA EXTRACRAN SEL COM CAROTID INNOMINATE UNI R MOD SED  08/21/2017   IR ANGIO VERTEBRAL SEL SUBCLAVIAN INNOMINATE UNI R MOD SED  08/21/2017   IR PERCUTANEOUS ART THROMBECTOMY/INFUSION INTRACRANIAL INC DIAG ANGIO  08/21/2017   IR RADIOLOGIST EVAL & MGMT  10/30/2017   LOOP RECORDER INSERTION N/A 08/28/2017   Procedure: LOOP RECORDER INSERTION;  Surgeon: Constance Haw, MD;  Location: [place] [facility];  Service: Cardiovascular;  Laterality: N/A;   RADIOLOGY WITH ANESTHESIA N/A 08/21/2017   Procedure: RADIOLOGY WITH ANESTHESIA;  Surgeon: Luanne Bras, MD;  Location: [facility];  Service: Radiology;  Laterality: N/A;   RIGHT/LEFT HEART CATH AND CORONARY ANGIOGRAPHY N/A 11/20/2017   Procedure: RIGHT/LEFT HEART CATH AND CORONARY ANGIOGRAPHY;  Surgeon: Jettie Booze, MD;  Location: [place] [facility];  Service: Cardiovascular;  Laterality: N/A;   TEE WITHOUT CARDIOVERSION N/A 08/28/2017   Procedure: TRANSESOPHAGEAL ECHOCARDIOGRAM (TEE);  Surgeon: Fay Records, MD;  Location: [facility];  Service: Cardiovascular;  Laterality: N/A;   TEE WITHOUT CARDIOVERSION N/A 11/23/2017   Procedure: TRANSESOPHAGEAL ECHOCARDIOGRAM (TEE);  Surgeon:  Gaye Pollack, MD;  Location: [facility];  Service: Open Heart Surgery;  Laterality: N/A;   TUBAL LIGATION     UPPER ESOPHAGEAL ENDOSCOPIC ULTRASOUND (EUS) N/A 12/02/2019   Procedure: UPPER ESOPHAGEAL ENDOSCOPIC ULTRASOUND (EUS);  Surgeon: Irving Copas., MD;  Location: [place];  Service: Gastroenterology;  Laterality: N/A;   Social History   Socioeconomic History   Marital status: Married    Spouse name: Not on file   Number of children: 2   Years of education: Not on file   Highest education level: Not on file  Occupational History   Not on file  Tobacco Use   Smoking status: Some Days    Packs/day: 0.10    Years: 37.00    Total pack years: 3.70    Types: Cigarettes   Smokeless tobacco: Never  Vaping Use   Vaping Use: Never used  Substance and Sexual Activity   Alcohol use: No   Drug use:  No   Sexual activity: Yes    Partners: Male  Other Topics Concern   Not on file  Social History Narrative   Married. 2 children.    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    Walks with cane or wheelchair.    Former smoker.    Drinks caffeine.    Smoke alarm in the home.   Firearms in the home.    Wears her seatbelt.    Feels safe In her relationships.    Social Determinants of Health   Financial Resource Strain: Not on file  Food Insecurity: Not on file  Transportation Needs: Not on file  Physical Activity: Not on file  Stress: Not on file  Social Connections: Unknown (09/05/2017)   Social Connection and Isolation Panel [NHANES]    Frequency of Communication with Friends and Family: Patient refused    Frequency of Social Gatherings with Friends and Family: Patient refused    Attends Religious Services: Patient refused    Active Member of Clubs or Organizations: Patient refused    Attends Archivist Meetings: Patient refused    Marital Status: Patient refused  Intimate Partner Violence: Unknown (09/05/2017)   Humiliation, Afraid, Rape, and Kick questionnaire     Fear of Current or Ex-Partner: Patient refused    Emotionally Abused: Patient refused    Physically Abused: Patient refused    Sexually Abused: Patient refused   Family History  Problem Relation Age of Onset   Colon polyps Mother    Hypertension Mother    Hyperlipidemia Mother    Diabetes type II Mother    Urticaria Mother    Heart attack Father    Hypertension Brother    Stomach cancer Maternal Grandfather    Breast cancer Cousin 45   Immunodeficiency Neg Hx    Eczema Neg Hx    Atopy Neg Hx    Asthma Neg Hx    Angioedema Neg Hx    Allergic rhinitis Neg Hx    Colon cancer Neg Hx    Esophageal cancer Neg Hx    Pancreatic cancer Neg Hx    Esophageal varices Neg Hx    Inflammatory bowel disease Neg Hx    Rectal cancer Neg Hx    I have reviewed her medical, social, and family history in detail and updated the electronic medical record as necessary.    PHYSICAL EXAMINATION  Ht '5\' 1"'$  (1.549 m)   Wt 123 lb (55.8 kg)   LMP  (LMP Unknown) Comment: at age 59  BMI 23.24 kg/m  Wt Readings from Last 3 Encounters:  06/29/22 123 lb (55.8 kg)  05/12/22 124 lb (56.2 kg)  05/04/22 126 lb 1.7 oz (57.2 kg)  GEN: NAD, appears stated age, doesn't appear chronically ill PSYCH: Cooperative, without pressured speech EYE: Conjunctivae pink, sclerae anicteric ENT: MMM CV: Nontachycardic RESP: No audible wheezing GI: NABS, soft, NT/ND, without rebound or guarding MSK/EXT: No significant lower extremity edema noted SKIN: No jaundice NEURO:  Alert & Oriented x 3, no focal deficits   REVIEW OF DATA  I reviewed the following data at the time of this encounter:  GI Procedures and Studies  November 2022 colonoscopy - Hemorrhoids found on digital rectal exam. - There was significant looping of the colon. - Nine, 2 to 10 mm polyps in the rectum, at the recto-sigmoid colon, in the sigmoid colon and in the transverse colon, removed with a cold snare. Resected and retrieved. - Diverticulosis  in the recto-sigmoid colon, in the sigmoid  colon and in the descending colon. - Normal mucosa in the entire examined colon otherwise. - Non-bleeding non-thrombosed external and internal hemorrhoids.  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 Interval development of mild diffuse pancreatic ductal dilatation. No evidence of pancreatic mass or biliary ductal dilatation. 2. Dominant cystic lesion within the pancreatic head has decreased in size from MRI of 2021, probably a pseudocyst. There is a smaller cystic lesion more posteriorly in the pancreatic head which is new. Consider continued surveillance. 3. Mild peripancreatic soft tissue stranding which may reflect mild uncomplicated pancreatitis. 4. Slowly enlarging infrarenal abdominal aortic aneurysm with irregular mural thrombus. Recommend follow-up ultrasound every 2 years. This recommendation follows ACR consensus guidelines: White Paper of the ACR Incidental Findings Committee II on Vascular Findings. J Am Coll Radiol 2013; 10:789-794.   ASSESSMENT  Ms. Prestia is a 61 y.o. female with a pmh significant for status post CVA, atrial fibrillation (on anticoagulation), CAD status post CABG, ischemic cardiomyopathy, recurrent idiopathic acute pancreatitis (status post ERCP with CBD and PD sphincterotomies for equivocal SOD findings on manometry), status postcholecystectomy, pancreatic cyst, GERD.  The patient is seen today for evaluation and management of:  No diagnosis found.  The patient is hemodynamically and clinically stable.  I am very surprised and happy that the patient has done this significantly well after her biliary and pancreatic sphincterotomies.  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  She is aware that she could have episodes years in the future if this enterotomies close down at which point we would go ahead and stretch those open if necessary.  Hopefully this will not be the case but she will be on the look out for that.  She does have evidence of a previous pancreas cyst versus necrosis that was noted last year and has not been followed up with imaging since she is done so well.  We will likely recommend in 2023, as long as the patient is still doing well to obtain cross-sectional imaging with a CT abdomen pancreas protocol or MRI/MRCP to ensure that the pancreas cyst has resolved or for monitoring purposes.  The patient is due for colon cancer screening and we will proceed with a colonoscopy after obtaining ability to come off anticoagulation safely.  She is going to maintain her current PPI dosing for now but if she continues to do well she will decrease her dose to 20 mg.  The risks and benefits of endoscopic evaluation were discussed with the patient; these include but are not limited to the risk of perforation, infection, bleeding, missed lesions, lack of diagnosis, severe illness requiring hospitalization, as well as anesthesia and sedation related illnesses.  The patient and/or family is agreeable to proceed.  All patient questions were answered to the best of my ability, and the patient agrees to the aforementioned plan of action with follow-up as indicated.   PLAN  Proceed with scheduling colonoscopy for screening purposes Maintain PPI 40 mg daily for now but may decrease to 20 mg daily if patient is doing well in future Cross-sectional imaging in 2023 (pancreas protocol CT abdomen versus MRI/MRCP abdomen) to follow-up prior pancreas cyst Heart healthy diet to be maintained  No orders of the defined types were placed in this  encounter.   New Prescriptions   No medications on file   Modified Medications   No medications on file    Planned Follow Up No follow-ups on file.   Total Time in Face-to-Face and in Coordination of Care for patient including independent/personal interpretation/review of prior testing, medical history, examination, medication adjustment, communicating results with the patient directly, and documentation with the EHR is 25 minutes.   Justice Britain, MD [facility] Advanced [facility]oscopy Office # [phone]

## 2022-06-29 NOTE — Patient Instructions (Addendum)
Try to decrease Senakot to once daily at bedtime.   You may add Miralax 1 capful 2-3 times daily as needed.    Add Fiber Con -Take 1-2 tablets once daily.   Please update office if above regimen is not helping.   We have sent the following medications to your pharmacy for you to pick up at your convenience: Tramadol   You will be due for a recall colonoscopy in 2025. We will send you a reminder in the mail when it gets closer to that time.  _______________________________________________________  If you are age 60 or older, your body mass index should be between 23-30. Your Body mass index is 23.24 kg/m. If this is out of the aforementioned range listed, please consider follow up with your Primary Care Provider.  If you are age 5 or younger, your body mass index should be between 19-25. Your Body mass index is 23.24 kg/m. If this is out of the aformentioned range listed, please consider follow up with your Primary Care Provider.   ________________________________________________________  The [facility] providers would like to encourage you to use [facility] to communicate with providers for non-urgent requests or questions.  Due to long hold times on the telephone, sending your provider a message by [facility] may be a faster and more efficient way to get a response.  Please allow 48 business hours for a response.  Please remember that this is for non-urgent requests.  _______________________________________________________  Thank you for choosing me and [facility].  [name] Landmark

## 2022-06-30 ENCOUNTER — Encounter: Payer: Self-pay | Admitting: Gastroenterology

## 2022-06-30 DIAGNOSIS — Z9889 Other specified postprocedural states: Secondary | ICD-10-CM | POA: Insufficient documentation

## 2022-06-30 DIAGNOSIS — K834 Spasm of sphincter of Oddi: Secondary | ICD-10-CM | POA: Insufficient documentation

## 2022-06-30 DIAGNOSIS — R194 Change in bowel habit: Secondary | ICD-10-CM | POA: Insufficient documentation

## 2022-06-30 HISTORY — DX: Other specified postprocedural states: Z98.890

## 2022-07-04 DIAGNOSIS — K861 Other chronic pancreatitis: Secondary | ICD-10-CM | POA: Diagnosis not present

## 2022-07-04 DIAGNOSIS — K859 Acute pancreatitis without necrosis or infection, unspecified: Secondary | ICD-10-CM | POA: Diagnosis not present

## 2022-07-04 DIAGNOSIS — K8689 Other specified diseases of pancreas: Secondary | ICD-10-CM | POA: Diagnosis not present

## 2022-07-04 DIAGNOSIS — K862 Cyst of pancreas: Secondary | ICD-10-CM | POA: Diagnosis not present

## 2022-07-04 DIAGNOSIS — I7143 Infrarenal abdominal aortic aneurysm, without rupture: Secondary | ICD-10-CM | POA: Diagnosis not present

## 2022-07-25 DIAGNOSIS — I482 Chronic atrial fibrillation, unspecified: Secondary | ICD-10-CM | POA: Diagnosis not present

## 2022-07-25 DIAGNOSIS — Z01818 Encounter for other preprocedural examination: Secondary | ICD-10-CM | POA: Diagnosis not present

## 2022-07-25 DIAGNOSIS — I509 Heart failure, unspecified: Secondary | ICD-10-CM | POA: Diagnosis not present

## 2022-07-25 DIAGNOSIS — Z8673 Personal history of transient ischemic attack (TIA), and cerebral infarction without residual deficits: Secondary | ICD-10-CM | POA: Diagnosis not present

## 2022-07-25 DIAGNOSIS — Z7901 Long term (current) use of anticoagulants: Secondary | ICD-10-CM | POA: Diagnosis not present

## 2022-07-25 DIAGNOSIS — I252 Old myocardial infarction: Secondary | ICD-10-CM | POA: Diagnosis not present

## 2022-07-25 DIAGNOSIS — I429 Cardiomyopathy, unspecified: Secondary | ICD-10-CM | POA: Diagnosis not present

## 2022-07-25 DIAGNOSIS — K8689 Other specified diseases of pancreas: Secondary | ICD-10-CM | POA: Diagnosis not present

## 2022-07-25 DIAGNOSIS — I25810 Atherosclerosis of coronary artery bypass graft(s) without angina pectoris: Secondary | ICD-10-CM | POA: Diagnosis not present

## 2022-07-29 DIAGNOSIS — K8591 Acute pancreatitis with uninfected necrosis, unspecified: Secondary | ICD-10-CM | POA: Diagnosis not present

## 2022-07-29 DIAGNOSIS — K5710 Diverticulosis of small intestine without perforation or abscess without bleeding: Secondary | ICD-10-CM | POA: Diagnosis not present

## 2022-07-29 DIAGNOSIS — I480 Paroxysmal atrial fibrillation: Secondary | ICD-10-CM | POA: Diagnosis not present

## 2022-07-29 DIAGNOSIS — K9189 Other postprocedural complications and disorders of digestive system: Secondary | ICD-10-CM | POA: Diagnosis not present

## 2022-07-29 DIAGNOSIS — R933 Abnormal findings on diagnostic imaging of other parts of digestive tract: Secondary | ICD-10-CM | POA: Diagnosis not present

## 2022-07-29 DIAGNOSIS — K862 Cyst of pancreas: Secondary | ICD-10-CM | POA: Diagnosis not present

## 2022-07-29 DIAGNOSIS — R932 Abnormal findings on diagnostic imaging of liver and biliary tract: Secondary | ICD-10-CM | POA: Diagnosis not present

## 2022-07-29 DIAGNOSIS — Z4659 Encounter for fitting and adjustment of other gastrointestinal appliance and device: Secondary | ICD-10-CM | POA: Diagnosis not present

## 2022-07-29 DIAGNOSIS — I5032 Chronic diastolic (congestive) heart failure: Secondary | ICD-10-CM | POA: Diagnosis not present

## 2022-07-29 DIAGNOSIS — K8689 Other specified diseases of pancreas: Secondary | ICD-10-CM | POA: Diagnosis not present

## 2022-07-29 DIAGNOSIS — K859 Acute pancreatitis without necrosis or infection, unspecified: Secondary | ICD-10-CM | POA: Diagnosis not present

## 2022-07-29 DIAGNOSIS — Z9889 Other specified postprocedural states: Secondary | ICD-10-CM | POA: Diagnosis not present

## 2022-08-03 ENCOUNTER — Encounter: Payer: Self-pay | Admitting: Family Medicine

## 2022-08-03 ENCOUNTER — Ambulatory Visit (INDEPENDENT_AMBULATORY_CARE_PROVIDER_SITE_OTHER): Payer: 59 | Admitting: Family Medicine

## 2022-08-03 VITALS — BP 118/82 | HR 100 | Wt 122.0 lb

## 2022-08-03 DIAGNOSIS — Z951 Presence of aortocoronary bypass graft: Secondary | ICD-10-CM

## 2022-08-03 DIAGNOSIS — I5032 Chronic diastolic (congestive) heart failure: Secondary | ICD-10-CM

## 2022-08-03 DIAGNOSIS — I480 Paroxysmal atrial fibrillation: Secondary | ICD-10-CM

## 2022-08-03 DIAGNOSIS — I63512 Cerebral infarction due to unspecified occlusion or stenosis of left middle cerebral artery: Secondary | ICD-10-CM

## 2022-08-03 DIAGNOSIS — Z23 Encounter for immunization: Secondary | ICD-10-CM | POA: Diagnosis not present

## 2022-08-03 DIAGNOSIS — I700 Atherosclerosis of aorta: Secondary | ICD-10-CM

## 2022-08-03 DIAGNOSIS — E785 Hyperlipidemia, unspecified: Secondary | ICD-10-CM

## 2022-08-03 DIAGNOSIS — K5900 Constipation, unspecified: Secondary | ICD-10-CM | POA: Diagnosis not present

## 2022-08-03 DIAGNOSIS — I214 Non-ST elevation (NSTEMI) myocardial infarction: Secondary | ICD-10-CM

## 2022-08-03 NOTE — Progress Notes (Unsigned)
[name] , 31-Jan-1961, 61 y.o., female MRN: [id number] Patient Care Team    Relationship Specialty Notifications Start End  Natalia Leatherwood, DO PCP - General Family Medicine  10/13/17   Regan Lemming, MD PCP - Electrophysiology Cardiology Admissions 08/28/17   Jonelle Sidle, MD PCP - Cardiology Cardiology Admissions 12/15/17   Marvel Plan, MD Consulting Physician Neurology  10/13/17   Marcello Fennel, MD Consulting Physician Physical Medicine and Rehabilitation  10/13/17   Julieanne Cotton, MD Consulting Physician Interventional Radiology  10/13/17   Mansouraty, Netty Starring., MD Consulting Physician Gastroenterology  03/03/20   Darnell Level, MD Consulting Physician General Surgery  04/09/20   Crista Elliot, MD Consulting Physician Urology  04/09/20     Chief Complaint  Patient presents with   Constipation     Subjective:  [name]  is a 61 y.o. female presents for constipation.  Patient presents today for office visit visit to discuss reducing her pill/medication load if possible, especially anything creating her worsening constipation.   No results for input(s): "HGB", "HCT", "WBC", "PLT" in the last 168 hours.     Latest Ref Rng & Units 05/12/2022   11:30 AM 05/06/2022    1:16 AM 05/04/2022    6:05 PM  CMP  Glucose 70 - 99 mg/dL 91  86  76   BUN 6 - 23 mg/dL 12  11  13    Creatinine 0.40 - 1.20 mg/dL 4.33  2.95  1.88   Sodium 135 - 145 mEq/L 137  137  132   Potassium 3.5 - 5.1 mEq/L 4.8  3.6  3.9   Chloride 96 - 112 mEq/L 102  108  102   CO2 19 - 32 mEq/L 26  23  19    Calcium 8.4 - 10.5 mg/dL 41.6  8.5  9.3   Total Protein 6.0 - 8.3 g/dL 7.6   7.3   Total Bilirubin 0.2 - 1.2 mg/dL 0.3   0.6   Alkaline Phos 39 - 117 U/L 85   71   AST 0 - 37 U/L 22   24   ALT 0 - 35 U/L 14   14       No results found.      11/25/2021   10:38 AM 10/21/2021    2:17 PM 09/29/2021    2:33 PM 08/17/2021   12:40 PM 07/13/2020   11:04 AM  Depression screen PHQ  2/9  Decreased Interest 2 0 0 0 0  Down, Depressed, Hopeless 0 0 0 0 0  PHQ - 2 Score 2 0 0 0 0  Altered sleeping 2      Tired, decreased energy 3      Change in appetite 0      Feeling bad or failure about yourself  0      Trouble concentrating 0      Moving slowly or fidgety/restless 0      Suicidal thoughts 0      PHQ-9 Score 7        Allergies  Allergen Reactions   Hydromorphone Other (See Comments)    Hallucinations Other reaction(s): Hallucination   Lopressor [Metoprolol Tartrate] Swelling    Angioedema   Sacubitril-Valsartan Swelling and Rash    Welts, possible angioedema  Rash   Allegra Allergy [Fexofenadine Hcl] Hives   Fexofenadine Hives   Nsaids Other (See Comments)    On Xarelto Not taking d/t abdominal pain   Gabapentin Rash  Gabapentin (Once-Daily) Rash   Lipitor [Atorvastatin] Diarrhea   Social History   Tobacco Use   Smoking status: Some Days    Packs/day: 0.10    Years: 37.00    Total pack years: 3.70    Types: Cigarettes   Smokeless tobacco: Never  Substance Use Topics   Alcohol use: No   Past Medical History:  Diagnosis Date   AAA (abdominal aortic aneurysm) (HCC)    a. 3.3cm infrarenal by CT 02/2020.   Abnormal findings on endoscopic ultrasound (EUS) 01/25/2020   Abnormality present on pathology (Atypical Pathology) 01/25/2020   Allergy    Angio-edema    Angioedema    Arthritis    CAD (coronary artery disease)    a. s/p NSTEMI in 10/2017 with cath showing LM disease --> s/p CABG on 11/23/2017 with LIMA-LAD and SVG-OM.    Carotid stenosis    a. Duplex 11/2018 1-39% RICA, 40-59% LICA.   Closed nondisplaced fracture of greater trochanter of right femur (HCC) 04/09/2020   Cystocele, unspecified  03/03/2020   Gallbladder sludge and cholelithiasis without cholecystitis 01/25/2020   Small stones of the gallbladder appreciated on CT 02/2020 as well.   Gastritis and duodenitis 12/06/2019   11/2019 Peptic duodenitis  -  No dysplasia or malignancy  identified  B. STOMACH, BIOPSY:  -  Mild chronic gastritis with intestinal metaplasia    Gastroesophageal reflux disease 02/26/2021   Hay fever    History of kidney stones    Hyperlipidemia    Idiopathic recurrent acute pancreatitis 01/25/2020   Ischemic cardiomyopathy    a. EF 35-40% by echo in 10/2017. b. 55% by echo 01/2020.   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   Stroke (HCC) 08/23/2017   Left MCA infarct status post TPA and thrombectomy   Urticaria    Past Surgical History:  Procedure Laterality Date   BIOPSY  12/02/2019   Procedure: BIOPSY;  Surgeon: Lemar Lofty., MD;  [facility] ENDOSCOPY;  Service: Gastroenterology;;   CHOLECYSTECTOMY N/A 04/30/2020   Procedure: LAPAROSCOPIC CHOLECYSTECTOMY WITH INTRAOPERATIVE CHOLANGIOGRAM;  Surgeon: Darnell Level, MD;  Location: [facility] ORS;  Service: General;  Laterality: N/A;   CORONARY ARTERY BYPASS GRAFT N/A 11/23/2017   Procedure: CORONARY ARTERY BYPASS GRAFTING (CABG) x 2, ON PUMP, USING LEFT INTERNAL MAMMARY ARTERY AND RIGHT GREATER SAPHENOUS VEIN HARVESTED ENDOSCOPICALLY;  Surgeon: Alleen Borne, MD;  Location: [facility] OR;  Service: Open Heart Surgery;  Laterality: N/A;   CRYOABLATION     cervical   ESOPHAGOGASTRODUODENOSCOPY (EGD) WITH PROPOFOL N/A 12/02/2019   Procedure: ESOPHAGOGASTRODUODENOSCOPY (EGD) WITH PROPOFOL;  Surgeon: Meridee Score Netty Starring., MD;  Location: [facility] ENDOSCOPY;  Service: Gastroenterology;  Laterality: N/A;   ESOPHAGOGASTRODUODENOSCOPY (EGD) WITH PROPOFOL N/A 03/19/2020   Procedure: ESOPHAGOGASTRODUODENOSCOPY (EGD) WITH PROPOFOL;  Surgeon: Rachael Fee, MD;  Location: [facility] ENDOSCOPY;  Service: Endoscopy;  Laterality: N/A;   EUS N/A  03/19/2020   Procedure: UPPER ENDOSCOPIC ULTRASOUND (EUS) RADIAL;  Surgeon: Rachael Fee, MD;  Location: [facility] ENDOSCOPY;  Service: Endoscopy;  Laterality: N/A;   EUS N/A 03/19/2020   Procedure: UPPER ENDOSCOPIC ULTRASOUND (EUS) LINEAR;  Surgeon: Rachael Fee, MD;  Location: [facility] ENDOSCOPY;  Service: Endoscopy;  Laterality: N/A;  FINE NEEDLE ASPIRATION  12/02/2019   Procedure: FINE NEEDLE ASPIRATION (FNA) LINEAR;  Surgeon: Lemar Lofty., MD;  Location: [facility] ENDOSCOPY;  Service: Gastroenterology;;   FINE NEEDLE ASPIRATION N/A 03/19/2020   Procedure: FINE NEEDLE ASPIRATION (FNA) LINEAR;  Surgeon: Rachael Fee, MD;  Location: [facility] ENDOSCOPY;  Service: Endoscopy;  Laterality: N/A;   IR ANGIO INTRA EXTRACRAN SEL COM CAROTID INNOMINATE UNI R MOD SED  08/21/2017   IR ANGIO VERTEBRAL SEL SUBCLAVIAN INNOMINATE UNI R MOD SED  08/21/2017   IR PERCUTANEOUS ART THROMBECTOMY/INFUSION INTRACRANIAL INC DIAG ANGIO  08/21/2017   IR RADIOLOGIST EVAL & MGMT  10/30/2017   LOOP RECORDER INSERTION N/A 08/28/2017   Procedure: LOOP RECORDER INSERTION;  Surgeon: Regan Lemming, MD;  Location: [facility] INVASIVE CV LAB;  Service: Cardiovascular;  Laterality: N/A;   RADIOLOGY WITH ANESTHESIA N/A 08/21/2017   Procedure: RADIOLOGY WITH ANESTHESIA;  Surgeon: Julieanne Cotton, MD;  Location: [facility] OR;  Service: Radiology;  Laterality: N/A;   RIGHT/LEFT HEART CATH AND CORONARY ANGIOGRAPHY N/A 11/20/2017   Procedure: RIGHT/LEFT HEART CATH AND CORONARY ANGIOGRAPHY;  Surgeon: Corky Crafts, MD;  Location: [facility] INVASIVE CV LAB;  Service: Cardiovascular;  Laterality: N/A;   TEE WITHOUT CARDIOVERSION N/A 08/28/2017   Procedure: TRANSESOPHAGEAL ECHOCARDIOGRAM (TEE);  Surgeon: Pricilla Riffle, MD;  Location: [facility] ENDOSCOPY;  Service: Cardiovascular;  Laterality: N/A;   TEE WITHOUT CARDIOVERSION N/A 11/23/2017   Procedure: TRANSESOPHAGEAL ECHOCARDIOGRAM (TEE);  Surgeon: Alleen Borne, MD;  Location: [facility] OR;  Service: Open Heart  Surgery;  Laterality: N/A;   TUBAL LIGATION     UPPER ESOPHAGEAL ENDOSCOPIC ULTRASOUND (EUS) N/A 12/02/2019   Procedure: UPPER ESOPHAGEAL ENDOSCOPIC ULTRASOUND (EUS);  Surgeon: Lemar Lofty., MD;  Location: [facility] ENDOSCOPY;  Service: Gastroenterology;  Laterality: N/A;   Family History  Problem Relation Age of Onset   Colon polyps Mother    Hypertension Mother    Hyperlipidemia Mother    Diabetes type II Mother    Urticaria Mother    Heart attack Father    Hypertension Brother    Stomach cancer Maternal Grandfather    Breast cancer Cousin 4   Immunodeficiency Neg Hx    Eczema Neg Hx    Atopy Neg Hx    Asthma Neg Hx    Angioedema Neg Hx    Allergic rhinitis Neg Hx    Colon cancer Neg Hx    Esophageal cancer Neg Hx    Pancreatic cancer Neg Hx    Esophageal varices Neg Hx    Inflammatory bowel disease Neg Hx    Rectal cancer Neg Hx    Allergies as of 08/03/2022       Reactions   Hydromorphone Other (See Comments)   Hallucinations Other reaction(s): Hallucination   Lopressor [metoprolol Tartrate] Swelling   Angioedema   Sacubitril-valsartan Swelling, Rash   Welts, possible angioedema Rash   Allegra Allergy [fexofenadine Hcl] Hives   Fexofenadine Hives   Nsaids Other (See Comments)   On Xarelto Not taking d/t abdominal pain   Gabapentin Rash   Gabapentin (once-daily) Rash   Lipitor [atorvastatin] Diarrhea        Medication List        Accurate as of August 03, 2022 11:59 PM. If you have any questions, ask your nurse or doctor.          STOP taking these medications    calcium carbonate 500 MG chewable tablet Commonly known as: TUMS - dosed in mg elemental calcium Stopped by: Luster Landsberg  Kaitlyn Butrick, DO   hydrOXYzine 10 MG tablet Commonly known as: ATARAX Stopped by: Felix Pacini, DO   levocetirizine 5 MG tablet Commonly known as: XYZAL Stopped by: Felix Pacini, DO   pantoprazole 20 MG tablet Commonly known as: PROTONIX Stopped by: Felix Pacini, DO    polyethylene glycol powder 17 GM/SCOOP powder Commonly known as: GLYCOLAX/MIRALAX Stopped by: Felix Pacini, DO   sennosides-docusate sodium 8.6-50 MG tablet Commonly known as: SENOKOT-S Stopped by: Felix Pacini, DO   vitamin B-12 100 MCG tablet Commonly known as: CYANOCOBALAMIN Stopped by: Felix Pacini, DO       TAKE these medications    carvedilol 3.125 MG tablet Commonly known as: COREG 1/2 tab PO BID   cholecalciferol 1000 units tablet Commonly known as: VITAMIN D Take 1,000 Units by mouth daily.   pravastatin 20 MG tablet Commonly known as: PRAVACHOL Take 1 tablet (20 mg total) by mouth every evening.   rivaroxaban 20 MG Tabs tablet Commonly known as: Xarelto TAKE 1 TABLET BY MOUTH ONCE DAILY WITH EVENING MEAL   traMADol 50 MG tablet Commonly known as: ULTRAM Take 1 tablet (50 mg total) by mouth every 12 (twelve) hours as needed for moderate pain. What changed:  when to take this reasons to take this Changed by: Felix Pacini, DO        All past medical history, surgical history, allergies, family history, immunizations and medications were updated in the EMR today and reviewed under the history and medication portions of their EMR.      ROS: Negative, with the exception of above mentioned in HPI  Objective:  BP 118/82   Pulse 100   Wt 122 lb (55.3 kg)   LMP  (LMP Unknown) Comment: at age 50  SpO2 96%   BMI 23.05 kg/m  Body mass index is 23.05 kg/m. Physical Exam Vitals and nursing note reviewed.  Constitutional:      General: She is not in acute distress.    Appearance: Normal appearance. She is not ill-appearing, toxic-appearing or diaphoretic.  HENT:     Head: Normocephalic and atraumatic.  Eyes:     General: No scleral icterus.       Right eye: No discharge.        Left eye: No discharge.     Extraocular Movements: Extraocular movements intact.     Conjunctiva/sclera: Conjunctivae normal.     Pupils: Pupils are equal, round, and  reactive to light.  Cardiovascular:     Rate and Rhythm: Normal rate and regular rhythm.  Pulmonary:     Effort: Pulmonary effort is normal. No respiratory distress.     Breath sounds: Normal breath sounds. No wheezing, rhonchi or rales.  Musculoskeletal:     Right lower leg: No edema.     Left lower leg: No edema.  Skin:    General: Skin is warm and dry.     Coloration: Skin is not jaundiced or pale.     Findings: No erythema or rash.  Neurological:     Mental Status: She is alert and oriented to person, place, and time. Mental status is at baseline.  Psychiatric:        Mood and Affect: Mood normal.        Behavior: Behavior normal.        Thought Content: Thought content normal.        Judgment: Judgment normal.    Assessment/Plan: [name] is a 61 y.o. female present for constipation Influenza vaccine needed -  Flu Vaccine QUAD 45mo+IM (Fluarix, Fluzone & Alfiuria Quad PF)  Constipation, unspecified constipation type We reviewed each medication on her medication list. She elected to stop calcium, Atarax, Xyzal, Protonix She elected to continue Xarelto, pravastatin, Coreg, tramadol. She understands that tramadol does cause constipation in some individuals.  She states she does not use this routinely and would like to remain on it for her pain control I encouraged her to continue her vitamin D supplementation.  She reports she will think about it and she may consider discontinuing at a later date. She will continue Senokot and MiraLAX as needed for constipation.  We also discussed the possibility of her medications not contributing to her constipation.  In the event she discontinues the above medicines and constipation remains we could consider Amitiza or like medicine.  Patient reports understanding. Reviewed expectations re: course of current medical issues. Discussed self-management of symptoms. Outlined signs and symptoms indicating need for more acute  intervention. Patient verbalized understanding and all questions were answered. Patient received an After-Visit Summary. Any changes in medications were reviewed and patient was provided with updated med list with their AVS.     Orders Placed This Encounter  Procedures   Flu Vaccine QUAD 52mo+IM (Fluarix, Fluzone & Alfiuria Quad PF)   Meds ordered this encounter  Medications   DISCONTD: traMADol (ULTRAM) 50 MG tablet    Sig: Take 1 tablet (50 mg total) by mouth every 12 (twelve) hours as needed for moderate pain.    Dispense:  60 tablet    Refill:  5   traMADol (ULTRAM) 50 MG tablet    Sig: Take 1 tablet (50 mg total) by mouth every 12 (twelve) hours as needed for moderate pain.    Dispense:  60 tablet    Refill:  5     Note is dictated utilizing voice recognition software. Although note has been proof read prior to signing, occasional typographical errors still can be missed. If any questions arise, please do not hesitate to call for verification.   electronically signed by:  Felix Pacini, DO  [facility] - OR

## 2022-08-04 MED ORDER — TRAMADOL HCL 50 MG PO TABS: 50.0000 mg | ORAL_TABLET | Freq: Two times a day (BID) | ORAL | 5 refills | Status: DC | PRN

## 2022-08-11 ENCOUNTER — Encounter: Payer: Self-pay | Admitting: Family Medicine

## 2022-10-20 ENCOUNTER — Telehealth: Payer: Self-pay

## 2022-10-20 ENCOUNTER — Other Ambulatory Visit (HOSPITAL_COMMUNITY): Payer: Self-pay

## 2022-10-20 NOTE — Telephone Encounter (Signed)
Pharmacy Patient Advocate Encounter   Received notification from [facility] that prior authorization for Tramadol '50mg'$  is required/requested.  Per Test Claim: Prior auth required   PA submitted on 10/20/22 to (ins) [employer] via [employer] - PA Case ID: [id number] Status is pending

## 2022-10-20 NOTE — Telephone Encounter (Signed)
noted 

## 2022-10-21 NOTE — Telephone Encounter (Addendum)
Prior authorization approved from [facility]   for Tramadol 50 mg tabs from 10/20/2022 to 04/18/23 through Cover my Meds.

## 2022-10-21 NOTE — Telephone Encounter (Signed)
Noted  

## 2022-10-27 ENCOUNTER — Encounter: Payer: Self-pay | Admitting: Family Medicine

## 2022-10-27 ENCOUNTER — Telehealth: Payer: Self-pay | Admitting: Gastroenterology

## 2022-10-27 ENCOUNTER — Ambulatory Visit (INDEPENDENT_AMBULATORY_CARE_PROVIDER_SITE_OTHER): Payer: 59 | Admitting: Family Medicine

## 2022-10-27 VITALS — BP 96/59 | HR 69 | Temp 98.5°F | Ht 61.0 in | Wt 124.0 lb

## 2022-10-27 DIAGNOSIS — I10 Essential (primary) hypertension: Secondary | ICD-10-CM | POA: Diagnosis not present

## 2022-10-27 DIAGNOSIS — R69 Illness, unspecified: Secondary | ICD-10-CM | POA: Diagnosis not present

## 2022-10-27 DIAGNOSIS — E063 Autoimmune thyroiditis: Secondary | ICD-10-CM

## 2022-10-27 DIAGNOSIS — Z1231 Encounter for screening mammogram for malignant neoplasm of breast: Secondary | ICD-10-CM

## 2022-10-27 DIAGNOSIS — I509 Heart failure, unspecified: Secondary | ICD-10-CM

## 2022-10-27 DIAGNOSIS — Z7901 Long term (current) use of anticoagulants: Secondary | ICD-10-CM

## 2022-10-27 DIAGNOSIS — M170 Bilateral primary osteoarthritis of knee: Secondary | ICD-10-CM

## 2022-10-27 DIAGNOSIS — I5032 Chronic diastolic (congestive) heart failure: Secondary | ICD-10-CM

## 2022-10-27 DIAGNOSIS — Z8673 Personal history of transient ischemic attack (TIA), and cerebral infarction without residual deficits: Secondary | ICD-10-CM

## 2022-10-27 DIAGNOSIS — F419 Anxiety disorder, unspecified: Secondary | ICD-10-CM

## 2022-10-27 DIAGNOSIS — R269 Unspecified abnormalities of gait and mobility: Secondary | ICD-10-CM

## 2022-10-27 DIAGNOSIS — E538 Deficiency of other specified B group vitamins: Secondary | ICD-10-CM | POA: Diagnosis not present

## 2022-10-27 DIAGNOSIS — E559 Vitamin D deficiency, unspecified: Secondary | ICD-10-CM

## 2022-10-27 DIAGNOSIS — I25810 Atherosclerosis of coronary artery bypass graft(s) without angina pectoris: Secondary | ICD-10-CM | POA: Diagnosis not present

## 2022-10-27 DIAGNOSIS — I214 Non-ST elevation (NSTEMI) myocardial infarction: Secondary | ICD-10-CM

## 2022-10-27 DIAGNOSIS — I429 Cardiomyopathy, unspecified: Secondary | ICD-10-CM

## 2022-10-27 DIAGNOSIS — G8110 Spastic hemiplegia affecting unspecified side: Secondary | ICD-10-CM | POA: Diagnosis not present

## 2022-10-27 DIAGNOSIS — I714 Abdominal aortic aneurysm, without rupture, unspecified: Secondary | ICD-10-CM

## 2022-10-27 DIAGNOSIS — E785 Hyperlipidemia, unspecified: Secondary | ICD-10-CM | POA: Diagnosis not present

## 2022-10-27 DIAGNOSIS — Z951 Presence of aortocoronary bypass graft: Secondary | ICD-10-CM

## 2022-10-27 DIAGNOSIS — I480 Paroxysmal atrial fibrillation: Secondary | ICD-10-CM

## 2022-10-27 LAB — LIPID PANEL
Cholesterol: 213 mg/dL — ABNORMAL HIGH (ref 0–200)
HDL: 45 mg/dL (ref >39.00)
NonHDL: 168.09
Total CHOL/HDL Ratio: 5
Triglycerides: 214 mg/dL — ABNORMAL HIGH (ref 0.0–149.0)
VLDL: 42.8 mg/dL — ABNORMAL HIGH (ref 0.0–40.0)

## 2022-10-27 LAB — COMPREHENSIVE METABOLIC PANEL
ALT: 9 U/L (ref 0–35)
AST: 15 U/L (ref 0–37)
Albumin: 4.2 g/dL (ref 3.5–5.2)
Alkaline Phosphatase: 83 U/L (ref 39–117)
BUN: 17 mg/dL (ref 6–23)
CO2: 27 mEq/L (ref 19–32)
Calcium: 9.5 mg/dL (ref 8.4–10.5)
Chloride: 104 mEq/L (ref 96–112)
Creatinine, Ser: 0.78 mg/dL (ref 0.40–1.20)
GFR: 81.35 mL/min (ref >60.00)
Glucose, Bld: 83 mg/dL (ref 70–99)
Potassium: 4.9 mEq/L (ref 3.5–5.1)
Sodium: 139 mEq/L (ref 135–145)
Total Bilirubin: 0.2 mg/dL (ref 0.2–1.2)
Total Protein: 7.1 g/dL (ref 6.0–8.3)

## 2022-10-27 LAB — CBC WITH DIFFERENTIAL/PLATELET
Basophils Absolute: 0.1 10*3/uL (ref 0.0–0.1)
Basophils Relative: 1.2 % (ref 0.0–3.0)
Eosinophils Absolute: 0.2 10*3/uL (ref 0.0–0.7)
Eosinophils Relative: 1.9 % (ref 0.0–5.0)
HCT: 39 % (ref 36.0–46.0)
Hemoglobin: 12.7 g/dL (ref 12.0–15.0)
Lymphocytes Relative: 23.9 % (ref 12.0–46.0)
Lymphs Abs: 2.3 10*3/uL (ref 0.7–4.0)
MCHC: 32.6 g/dL (ref 30.0–36.0)
MCV: 86.5 fl (ref 78.0–100.0)
Monocytes Absolute: 0.9 10*3/uL (ref 0.1–1.0)
Monocytes Relative: 9 % (ref 3.0–12.0)
Neutro Abs: 6.1 10*3/uL (ref 1.4–7.7)
Neutrophils Relative %: 64 % (ref 43.0–77.0)
Platelets: 426 10*3/uL — ABNORMAL HIGH (ref 150.0–400.0)
RBC: 4.5 Mil/uL (ref 3.87–5.11)
RDW: 15.7 % — ABNORMAL HIGH (ref 11.5–15.5)
WBC: 9.5 10*3/uL (ref 4.0–10.5)

## 2022-10-27 LAB — LDL CHOLESTEROL, DIRECT: Direct LDL: 134 mg/dL

## 2022-10-27 LAB — VITAMIN B12: Vitamin B-12: 404 pg/mL (ref 211–911)

## 2022-10-27 LAB — VITAMIN D 25 HYDROXY (VIT D DEFICIENCY, FRACTURES): VITD: 30.98 ng/mL (ref 30.00–100.00)

## 2022-10-27 LAB — HEMOGLOBIN A1C: Hgb A1c MFr Bld: 5.8 % (ref 4.6–6.5)

## 2022-10-27 MED ORDER — TRAMADOL HCL 50 MG PO TABS: 50.0000 mg | ORAL_TABLET | Freq: Two times a day (BID) | ORAL | 5 refills | Status: DC | PRN

## 2022-10-27 MED ORDER — PRAVASTATIN SODIUM 20 MG PO TABS: 20.0000 mg | ORAL_TABLET | Freq: Every evening | ORAL | 3 refills | Status: DC

## 2022-10-27 MED ORDER — RIVAROXABAN 20 MG PO TABS: ORAL_TABLET | ORAL | 5 refills | Status: DC

## 2022-10-27 MED ORDER — CARVEDILOL 3.125 MG PO TABS: ORAL_TABLET | ORAL | 1 refills | Status: DC

## 2022-10-27 NOTE — Patient Instructions (Addendum)
Return in about 24 weeks (around 04/13/2023) for Routine chronic condition follow-up.   Your mammogram is due this year. I have placed the order for you.      Great to see you today.  I have refilled the medication(s) we provide.   If labs were collected, we will inform you of lab results once received either by echart message or telephone call.   - echart message- for normal results that have been seen by the patient already.   - telephone call: abnormal results or if patient has not viewed results in their echart.

## 2022-10-27 NOTE — Progress Notes (Signed)
[name] , 08-Nov-1960, 62 y.o., female MRN: [id number] Patient Care Team    Relationship Specialty Notifications Start End  [name], DO PCP - General Family Medicine  10/13/17   Constance Haw, MD PCP - Electrophysiology Cardiology Admissions 08/28/17   Satira Sark, MD PCP - Cardiology Cardiology Admissions 12/15/17   Rosalin Hawking, MD Consulting Physician Neurology  10/13/17   Jamse Arn, MD Consulting Physician Physical Medicine and Rehabilitation  10/13/17   Luanne Bras, MD Consulting Physician Interventional Radiology  10/13/17   Mansouraty, Telford Nab., MD Consulting Physician Gastroenterology  03/03/20   Armandina Gemma, MD Consulting Physician General Surgery  04/09/20   Lucas Mallow, MD Consulting Physician Urology  04/09/20     Chief Complaint  Patient presents with   Hypertension    [place]; pt is fasting      Subjective:  [name]  is a 62 y.o. female presents for Chronic Conditions/illness Management H/o NSTEMI (non-ST elevated myocardial infarction) (HCC)/Coronary atherosclerosis of autologous artery bypass graft without angina/Benign essential HTN/Chronic diastolic CHF (congestive heart failure) (HCC)/Atrial fibrillation, chronic (HCC)/Atherosclerosis of aorta (HCC)/HLD Pt reports appliance with Coreg half a tab twice daily, statin and Xarelto. Blood pressures ranges at home are not routinely checked. Patient denies chest pain, shortness of breath, dizziness or lower extremity edema.   Anxiety Patient reports Vistaril nightly been helpful for sleep.   Left middle cerebral artery stroke (HCC)/Spastic hemiparesis affecting dominant side (HCC)/Cerebrovascular accident (CVA) due to occlusion of left middle cerebral artery [facility]) Patient following with physical/rehab.  She is no longer taking the baclofen.  She still uses the tramadol 50-100 mg before bed, and sometimes in the day if needed for pain.  Pain is secondary to her stroke  and her arthritis.  She feels the tramadol has been helpful.  No results for input(s): "HGB", "HCT", "WBC", "PLT" in the last 168 hours.     Latest Ref Rng & Units 05/12/2022   11:30 AM 05/06/2022    1:16 AM 05/04/2022    6:05 PM  CMP  Glucose 70 - 99 mg/dL 91  86  76   BUN 6 - 23 mg/dL '12  11  13   '$ Creatinine 0.40 - 1.20 mg/dL 0.79  0.68  0.73   Sodium 135 - 145 mEq/L 137  137  132   Potassium 3.5 - 5.1 mEq/L 4.8  3.6  3.9   Chloride 96 - 112 mEq/L 102  108  102   CO2 19 - 32 mEq/L '26  23  19   '$ Calcium 8.4 - 10.5 mg/dL 10.0  8.5  9.3   Total Protein 6.0 - 8.3 g/dL 7.6   7.3   Total Bilirubin 0.2 - 1.2 mg/dL 0.3   0.6   Alkaline Phos 39 - 117 U/L 85   71   AST 0 - 37 U/L 22   24   ALT 0 - 35 U/L 14   14     No results found.      10/27/2022   10:21 AM 11/25/2021   10:38 AM 10/21/2021    2:17 PM 09/29/2021    2:33 PM 08/17/2021   12:40 PM  Depression screen PHQ 2/9  Decreased Interest 0 2 0 0 0  Down, Depressed, Hopeless 0 0 0 0 0  PHQ - 2 Score 0 2 0 0 0  Altered sleeping  2     Tired, decreased energy  3  Change in appetite  0     Feeling bad or failure about yourself   0     Trouble concentrating  0     Moving slowly or fidgety/restless  0     Suicidal thoughts  0     PHQ-9 Score  7       Allergies  Allergen Reactions   Hydromorphone Other (See Comments)    Hallucinations Other reaction(s): Hallucination   Lopressor [Metoprolol Tartrate] Swelling    Angioedema   Sacubitril-Valsartan Swelling and Rash    Welts, possible angioedema  Rash   Allegra Allergy [Fexofenadine Hcl] Hives   Fexofenadine Hives   Nsaids Other (See Comments)    On Xarelto Not taking d/t abdominal pain   Gabapentin Rash   Gabapentin (Once-Daily) Rash   Lipitor [Atorvastatin] Diarrhea   Social History   Tobacco Use   Smoking status: Some Days    Packs/day: 0.10    Years: 37.00    Total pack years: 3.70    Types: Cigarettes   Smokeless tobacco: Never  Substance Use Topics    Alcohol use: No   Past Medical History:  Diagnosis Date   AAA (abdominal aortic aneurysm) (HCC)    a. 3.3cm infrarenal by CT 02/2020.   Abnormal findings on endoscopic ultrasound (EUS) 01/25/2020   Abnormality present on pathology (Atypical Pathology) 01/25/2020   Allergy    Angio-edema    Angioedema    Arthritis    CAD (coronary artery disease)    a. s/p NSTEMI in 10/2017 with cath showing LM disease --> s/p CABG on 11/23/2017 with LIMA-LAD and SVG-OM.    Carotid stenosis    a. Duplex 11/2018 7-82% RICA, 42-35% LICA.   Closed nondisplaced fracture of greater trochanter of right femur ([place]) 04/09/2020   Cystocele, unspecified  03/03/2020   Gallbladder sludge and cholelithiasis without cholecystitis 01/25/2020   Small stones of the gallbladder appreciated on CT 02/2020 as well.   Gastritis and duodenitis 12/06/2019   11/2019 Peptic duodenitis  -  No dysplasia or malignancy identified  B. STOMACH, BIOPSY:  -  Mild chronic gastritis with intestinal metaplasia    Gastroesophageal reflux disease 02/26/2021   Hay fever    History of kidney stones    Hyperlipidemia    Idiopathic recurrent acute pancreatitis 01/25/2020   Ischemic cardiomyopathy    a. EF 35-40% by echo in 10/2017. b. 55% by echo 01/2020.   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   Stroke ([place]) 08/23/2017   Left MCA infarct status post TPA and thrombectomy   Urticaria    Past Surgical History:  Procedure Laterality Date   BIOPSY  12/02/2019   Procedure: BIOPSY;  Surgeon: Irving Copas., MD;  Location: [place];  Service: Gastroenterology;;   CHOLECYSTECTOMY N/A 04/30/2020   Procedure: LAPAROSCOPIC CHOLECYSTECTOMY WITH INTRAOPERATIVE  CHOLANGIOGRAM;  Surgeon: Armandina Gemma, MD;  Location: [facility];  Service: General;  Laterality: N/A;   CORONARY ARTERY BYPASS GRAFT N/A 11/23/2017   Procedure: CORONARY ARTERY BYPASS GRAFTING (CABG) x 2, ON PUMP, USING LEFT INTERNAL MAMMARY ARTERY AND RIGHT GREATER SAPHENOUS VEIN HARVESTED ENDOSCOPICALLY;  Surgeon: Gaye Pollack, MD;  Location: [place];  Service: Open Heart Surgery;  Laterality: N/A;   CRYOABLATION     cervical   ESOPHAGOGASTRODUODENOSCOPY (EGD) WITH PROPOFOL N/A 12/02/2019   Procedure: ESOPHAGOGASTRODUODENOSCOPY (EGD) WITH PROPOFOL;  Surgeon: Rush Landmark Telford Nab., MD;  Location: [place];  Service: Gastroenterology;  Laterality: N/A;   ESOPHAGOGASTRODUODENOSCOPY (EGD) WITH PROPOFOL N/A 03/19/2020   Procedure: ESOPHAGOGASTRODUODENOSCOPY (EGD) WITH PROPOFOL;  Surgeon: Milus Banister, MD;  [facility];  Service: Endoscopy;  Laterality: N/A;   EUS N/A 03/19/2020   Procedure: UPPER ENDOSCOPIC ULTRASOUND (EUS) RADIAL;  Surgeon: Milus Banister, MD;  [facility];  Service: Endoscopy;  Laterality: N/A;   EUS N/A 03/19/2020   Procedure: UPPER ENDOSCOPIC ULTRASOUND (EUS) LINEAR;  Surgeon: Milus Banister, MD;  [facility];  Service: Endoscopy;  Laterality: N/A;   FINE NEEDLE ASPIRATION  12/02/2019   Procedure: FINE NEEDLE ASPIRATION (FNA) LINEAR;  Surgeon: Irving Copas., MD;  Location: [place];  Service: Gastroenterology;;   FINE NEEDLE ASPIRATION N/A 03/19/2020   Procedure: FINE NEEDLE ASPIRATION (FNA) LINEAR;  Surgeon: Milus Banister, MD;  [facility];  Service: Endoscopy;  Laterality: N/A;   IR ANGIO INTRA EXTRACRAN SEL COM CAROTID INNOMINATE UNI R MOD SED  08/21/2017   IR ANGIO VERTEBRAL SEL SUBCLAVIAN INNOMINATE UNI R MOD SED  08/21/2017   IR PERCUTANEOUS ART THROMBECTOMY/INFUSION INTRACRANIAL INC DIAG ANGIO  08/21/2017   IR RADIOLOGIST EVAL & MGMT  10/30/2017   LOOP RECORDER INSERTION N/A 08/28/2017   Procedure: LOOP  RECORDER INSERTION;  Surgeon: Constance Haw, MD;  Location: [facility];  Service: Cardiovascular;  Laterality: N/A;   RADIOLOGY WITH ANESTHESIA N/A 08/21/2017   Procedure: RADIOLOGY WITH ANESTHESIA;  Surgeon: Luanne Bras, MD;  Location: [place];  Service: Radiology;  Laterality: N/A;   RIGHT/LEFT HEART CATH AND CORONARY ANGIOGRAPHY N/A 11/20/2017   Procedure: RIGHT/LEFT HEART CATH AND CORONARY ANGIOGRAPHY;  Surgeon: Jettie Booze, MD;  Location: [facility];  Service: Cardiovascular;  Laterality: N/A;   TEE WITHOUT CARDIOVERSION N/A 08/28/2017   Procedure: TRANSESOPHAGEAL ECHOCARDIOGRAM (TEE);  Surgeon: Fay Records, MD;  Location: [facility];  Service: Cardiovascular;  Laterality: N/A;   TEE WITHOUT CARDIOVERSION N/A 11/23/2017   Procedure: TRANSESOPHAGEAL ECHOCARDIOGRAM (TEE);  Surgeon: Gaye Pollack, MD;  Location: [facility];  Service: Open Heart Surgery;  Laterality: N/A;   TUBAL LIGATION     UPPER ESOPHAGEAL ENDOSCOPIC ULTRASOUND (EUS) N/A 12/02/2019   Procedure: UPPER ESOPHAGEAL ENDOSCOPIC ULTRASOUND (EUS);  Surgeon: Irving Copas., MD;  Location: [facility];  Service: Gastroenterology;  Laterality: N/A;   Family History  Problem Relation Age of Onset   Colon polyps Mother    Hypertension Mother    Hyperlipidemia Mother    Diabetes type II Mother    Urticaria Mother    Heart attack Father    Hypertension Brother    Stomach cancer Maternal Grandfather    Breast cancer Cousin 50   Immunodeficiency Neg Hx    Eczema Neg Hx    Atopy Neg Hx    Asthma Neg Hx    Angioedema Neg Hx    Allergic rhinitis Neg Hx    Colon cancer Neg Hx    Esophageal cancer Neg Hx    Pancreatic cancer Neg Hx    Esophageal varices Neg Hx    Inflammatory bowel disease Neg Hx    Rectal cancer Neg Hx    Allergies as of 10/27/2022       Reactions   Hydromorphone Other (See Comments)   Hallucinations  Other reaction(s): Hallucination   Lopressor [metoprolol Tartrate]  Swelling   Angioedema   Sacubitril-valsartan Swelling, Rash   Welts, possible angioedema Rash   Allegra Allergy [fexofenadine Hcl] Hives   Fexofenadine Hives   Nsaids Other (See Comments)   On Xarelto Not taking d/t abdominal pain   Gabapentin Rash   Gabapentin (once-daily) Rash   Lipitor [atorvastatin] Diarrhea        Medication List        Accurate as of October 27, 2022 10:45 AM. If you have any questions, ask your nurse or doctor.          STOP taking these medications    cholecalciferol 1000 units tablet Commonly known as: VITAMIN D Stopped by: Howard Pouch, DO       TAKE these medications    carvedilol 3.125 MG tablet Commonly known as: COREG 1/2 tab PO BID   levocetirizine 5 MG tablet Commonly known as: XYZAL Take 5 mg by mouth every evening.   pravastatin 20 MG tablet Commonly known as: PRAVACHOL Take 1 tablet (20 mg total) by mouth every evening.   rivaroxaban 20 MG Tabs tablet Commonly known as: Xarelto TAKE 1 TABLET BY MOUTH ONCE DAILY WITH EVENING MEAL   traMADol 50 MG tablet Commonly known as: ULTRAM Take 1 tablet (50 mg total) by mouth every 12 (twelve) hours as needed for moderate pain.        All past medical history, surgical history, allergies, family history, immunizations and medications were updated in the EMR today and reviewed under the history and medication portions of their EMR.      ROS: Negative, with the exception of above mentioned in HPI   Objective:  BP (!) 96/59   Pulse 69   Temp 98.5 F (36.9 C) (Oral)   Ht '5\' 1"'$  (1.549 m)   Wt 124 lb (56.2 kg)   LMP  (LMP Unknown) Comment: at age 42  SpO2 97%   BMI 23.43 kg/m  Body mass index is 23.43 kg/m. Physical Exam Vitals and nursing note reviewed.  Constitutional:      General: She is not in acute distress.    Appearance: Normal appearance. She is not ill-appearing, toxic-appearing or diaphoretic.  HENT:     Head: Normocephalic and atraumatic.  Eyes:      General: No scleral icterus.       Right eye: No discharge.        Left eye: No discharge.     Extraocular Movements: Extraocular movements intact.     Conjunctiva/sclera: Conjunctivae normal.     Pupils: Pupils are equal, round, and reactive to light.  Cardiovascular:     Rate and Rhythm: Normal rate and regular rhythm.  Pulmonary:     Effort: Pulmonary effort is normal. No respiratory distress.     Breath sounds: Normal breath sounds. No wheezing, rhonchi or rales.  Musculoskeletal:     Right lower leg: No edema.     Left lower leg: No edema.  Skin:    General: Skin is warm.     Findings: No rash.  Neurological:     Mental Status: She is alert and oriented to person, place, and time. Mental status is at baseline.     Motor: No weakness.     Gait: Gait normal.  Psychiatric:        Mood and Affect: Mood normal.        Behavior: Behavior normal.        Thought Content: Thought  content normal.        Judgment: Judgment normal.    Assessment/Plan: KELTIE LABELL is a 62 y.o. 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 Continue routine follow-ups with cardiology. Continue  pravastatin 20 mg daily-unable to tolerate higher doses or other statins Continue  Coreg 3.125 mg at lower dose of a half a tab-twice daily Continue  Xarelto 20 mg daily Cbc, cmp, tsh, lipids  Osteopenia: Vit d collected today- she had elected to stop dexa and vit d supplement.   Anxiety/allergies Stable Continue hydroxyzine 10 mg daily at bedtime Continue  Xyzal daily-evening   Abdominal aortic aneurysm (AAA) without rupture (HCC) Has been stable, repeat imaging due  2025   Left middle cerebral artery stroke (HCC)/Spastic hemiparesis affecting dominant side (HCC)/Cerebrovascular accident (CVA) due to occlusion of left middle cerebral artery (HCC) Stable  Continue  statin and chronic anticoagulation with Xarelto. CBC, cmp, tsh, lipids  Breast cancer screen: 3d mam ordered today  Reviewed expectations re: course of current medical issues. Discussed self-management of symptoms. Outlined signs and symptoms indicating need for more acute intervention. Patient verbalized understanding and all questions were answered. Patient received an After-Visit Summary. Any changes in medications were reviewed and patient was provided with updated med list with their AVS.    Meds ordered this encounter  Medications   carvedilol (COREG) 3.125 MG tablet    Sig: 1/2 tab PO BID    Dispense:  90 tablet    Refill:  1   pravastatin (PRAVACHOL) 20 MG tablet    Sig: Take 1 tablet (20 mg total) by mouth every evening.    Dispense:  90 tablet    Refill:  3    Pt needs follow up visit for further refills.   rivaroxaban (XARELTO) 20 MG TABS tablet    Sig: TAKE 1 TABLET BY MOUTH ONCE DAILY WITH EVENING MEAL    Dispense:  30 tablet    Refill:  5   traMADol (ULTRAM) 50 MG tablet    Sig: Take 1 tablet (50 mg total) by mouth every 12 (twelve) hours as needed for moderate pain.    Dispense:  60 tablet    Refill:  5     Orders Placed This Encounter  Procedures   MM 3D SCREEN BREAST BILATERAL   Hemoglobin A1c   Comp Met (CMET)   CBC w/Diff   Lipid panel   Vitamin D (25 hydroxy)   B12     Note is dictated utilizing voice recognition software. Although note has been proof read prior to signing, occasional typographical errors still can be missed. If any questions arise, please do not hesitate to call for verification.   electronically signed by:  Howard Pouch, DO  [place]

## 2022-10-27 NOTE — Telephone Encounter (Signed)
The pt did have xray completed on 05/2021.  I have faxed the report to Mickle Asper at [facility] to [phone]

## 2022-10-27 NOTE — Telephone Encounter (Signed)
Patient is calling because she had an ercp with [place] and her pcp is wanting have xrays done with [place] for her to have a KUB. Please advise. Thanks

## 2022-10-28 ENCOUNTER — Telehealth: Payer: Self-pay | Admitting: Family Medicine

## 2022-10-28 NOTE — Telephone Encounter (Signed)
Please call patient Liver and kidney function are normal Blood cell counts and electrolytes are normal Diabetes screening/A1c is normal Cholesterol panel is above goal for her with an LDL of 134.  Her LDL should be at least less than 100 and preferably closer to 70 for adequate cardiovascular coverage. Please encouraged her to make sure she is taking her pravastatin nightly.  B12 and vitamin D levels are in the low normal range.  Therefore she does not have to restart supplement if she does not desire.  However doing so may help her gain some bone health, nerve health and helps with with energy and memory.

## 2022-10-28 NOTE — Telephone Encounter (Signed)
Spoke with patient regarding results/recommendations.  

## 2022-12-09 ENCOUNTER — Other Ambulatory Visit: Payer: Self-pay | Admitting: Family Medicine

## 2022-12-12 ENCOUNTER — Telehealth: Payer: Self-pay | Admitting: Family Medicine

## 2022-12-12 ENCOUNTER — Other Ambulatory Visit: Payer: Self-pay | Admitting: Family Medicine

## 2022-12-12 MED ORDER — LEVOCETIRIZINE DIHYDROCHLORIDE 5 MG PO TABS: 5.0000 mg | ORAL_TABLET | Freq: Every evening | ORAL | 1 refills | Status: DC

## 2022-12-12 NOTE — Telephone Encounter (Signed)
Patient needs a refill on levocetirizine (XYZAL) 5 MG tablet her pharmacy is [facility] in [place]

## 2022-12-27 NOTE — Progress Notes (Unsigned)
    Cardiology Office Note  Date: 12/28/2022   ID: [name], DOB 02/10/1961, MRN [id number]  History of Present Illness: [name] is a 62 y.o. female last assessed in January 2022 by [name] PA-C and more recently by Dr. Curt Bears in May 2023, I reviewed both notes.  She presents for a follow-up visit.  Reports no significant palpitations, no angina, stable NYHA class II dyspnea.  Medications have been simplified through follow-up with PCP.  I went over her current regimen.  She does not report any spontaneous bleeding problems on Xarelto.  Tolerating Pravachol without myalgias.  We discussed advancing the dose given her most recent LDL of 134.  Also tolerating Coreg.  She is due for follow-up echocardiogram and also carotid Dopplers.  Physical Exam: VS:  BP 124/78   Pulse 91   Ht 5' (1.524 m)   Wt 127 lb (57.6 kg)   LMP  (LMP Unknown) Comment: at age 88  SpO2 96%   BMI 24.80 kg/m , BMI Body mass index is 24.8 kg/m.  Wt Readings from Last 3 Encounters:  12/28/22 127 lb (57.6 kg)  10/27/22 124 lb (56.2 kg)  08/03/22 122 lb (55.3 kg)    General: Patient appears comfortable at rest. HEENT: Conjunctiva and lids normal. Neck: Supple, no elevated JVP, left carotid bruit. Lungs: Clear to auscultation, nonlabored breathing at rest. Cardiac: Regular rate and rhythm, no S3, 1/6 systolic murmur. Extremities: No pitting edema.  ECG:  An ECG dated 05/05/2022 was personally reviewed today and demonstrated:  Sinus rhythm with PVCs.  Labwork: 05/04/2022: TSH 2.32 10/27/2022: ALT 9; AST 15; BUN 17; Creatinine, Ser 0.78; Hemoglobin 12.7; Platelets 426.0; Potassium 4.9; Sodium 139     Component Value Date/Time   CHOL 213 (H) 10/27/2022 1044   TRIG 214.0 (H) 10/27/2022 1044   HDL 45.00 10/27/2022 1044   CHOLHDL 5 10/27/2022 1044   VLDL 42.8 (H) 10/27/2022 1044   LDLCALC 158 (H) 09/11/2019 0453   LDLDIRECT 134.0 10/27/2022 1044   Other Studies Reviewed Today:  No interval  cardiac testing for review today.  Assessment and Plan:  1.  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  She reports no active angina at this time and is on low-dose Coreg along with Pravachol.  2.  HFrecEF with history of ischemic cardiomyopathy, LVEF 55% by follow-up echocardiogram in May 2021.  Plan to update echocardiogram to ensure no significant change.  She is on Coreg alone at this point.  Had prior intolerances to [facility] and Lopressor.  3.  Paroxysmal atrial fibrillation with CHA2DS2-VASc score of 5.  No progressive palpitations.  She remains on Xarelto for stroke prophylaxis.  Continue observation.  4.  Asymptomatic carotid artery disease.  Carotid Dopplers from October 2022 revealed [id number] LICA stenosis and 123456 RICA stenosis.  Follow-up carotid Dopplers.  5.  Mixed hyperlipidemia.  Recent LDL 134.  She has prior intolerance to Lipitor.  Increase Pravachol to 40 mg daily and continue titration as needed.  Next step would be considering a switch to Crestor and if not tolerated evaluation for PCSK9 inhibitors.  Disposition:  Follow up  6 months.  Signed, Satira Sark, M.D., F.A.C.C. [place] at [facility]

## 2022-12-28 ENCOUNTER — Encounter: Payer: Self-pay | Admitting: Cardiology

## 2022-12-28 ENCOUNTER — Ambulatory Visit: Payer: 59 | Attending: Cardiology | Admitting: Cardiology

## 2022-12-28 VITALS — BP 124/78 | HR 91 | Ht 60.0 in | Wt 127.0 lb

## 2022-12-28 DIAGNOSIS — I25119 Atherosclerotic heart disease of native coronary artery with unspecified angina pectoris: Secondary | ICD-10-CM | POA: Diagnosis not present

## 2022-12-28 DIAGNOSIS — I480 Paroxysmal atrial fibrillation: Secondary | ICD-10-CM | POA: Diagnosis not present

## 2022-12-28 DIAGNOSIS — I6523 Occlusion and stenosis of bilateral carotid arteries: Secondary | ICD-10-CM

## 2022-12-28 DIAGNOSIS — E782 Mixed hyperlipidemia: Secondary | ICD-10-CM | POA: Diagnosis not present

## 2022-12-28 DIAGNOSIS — I429 Cardiomyopathy, unspecified: Secondary | ICD-10-CM

## 2022-12-28 MED ORDER — PRAVASTATIN SODIUM 40 MG PO TABS: 40.0000 mg | ORAL_TABLET | Freq: Every evening | ORAL | 3 refills | Status: DC

## 2022-12-28 NOTE — Patient Instructions (Addendum)
Medication Instructions:  Your physician has recommended you make the following change in your medication:  -Increase Pravachol (Pravastatin) to 40 mg tablets once dialy  *If you need a refill on your cardiac medications before your next appointment, please call your pharmacy*   Lab Work: None If you have labs (blood work) drawn today and your tests are completely normal, you will receive your results only by: [place] (if you have MyChart) OR A paper copy in the mail If you have any lab test that is abnormal or we need to change your treatment, we will call you to review the results.   Testing/Procedures: Your physician has requested that you have an echocardiogram. Echocardiography is a painless test that uses sound waves to create images of your heart. It provides your doctor with information about the size and shape of your heart and how well your heart's chambers and valves are working. This procedure takes approximately one hour. There are no restrictions for this procedure. Please do NOT wear cologne, perfume, aftershave, or lotions (deodorant is allowed). Please arrive 15 minutes prior to your appointment time.  Your physician has requested that you have a carotid duplex. This test is an ultrasound of the carotid arteries in your neck. It looks at blood flow through these arteries that supply the brain with blood. Allow one hour for this exam. There are no restrictions or special instructions.    Follow-Up: At [facility], you and your health needs are our priority.  As part of our continuing mission to provide you with exceptional heart care, we have created designated Provider Care Teams.  These Care Teams include your primary Cardiologist (physician) and Advanced Practice Providers (APPs -  Physician Assistants and Nurse Practitioners) who all work together to provide you with the care you need, when you need it.  We recommend signing up for the patient portal  called "MyChart".  Sign up information is provided on this After Visit Summary.  MyChart is used to connect with patients for Virtual Visits (Telemedicine).  Patients are able to view lab/test results, encounter notes, upcoming appointments, etc.  Non-urgent messages can be sent to your provider as well.   To learn more about what you can do with MyChart, go to [email or web address].    Your next appointment:   6 month(s)  Provider:   [name], MD    Other Instructions

## 2023-01-02 ENCOUNTER — Other Ambulatory Visit: Payer: Self-pay | Admitting: Family Medicine

## 2023-01-04 DIAGNOSIS — K859 Acute pancreatitis without necrosis or infection, unspecified: Secondary | ICD-10-CM | POA: Diagnosis not present

## 2023-01-04 DIAGNOSIS — K861 Other chronic pancreatitis: Secondary | ICD-10-CM | POA: Diagnosis not present

## 2023-01-09 ENCOUNTER — Ambulatory Visit
Admission: RE | Admit: 2023-01-09 | Discharge: 2023-01-09 | Disposition: A | Discharge status: Home / Self Care | Payer: 59 | Source: Ambulatory Visit | Attending: Family Medicine | Admitting: Family Medicine

## 2023-01-09 DIAGNOSIS — Z1231 Encounter for screening mammogram for malignant neoplasm of breast: Secondary | ICD-10-CM | POA: Diagnosis not present

## 2023-01-11 ENCOUNTER — Other Ambulatory Visit: Payer: Self-pay | Admitting: Family Medicine

## 2023-01-11 DIAGNOSIS — R928 Other abnormal and inconclusive findings on diagnostic imaging of breast: Secondary | ICD-10-CM

## 2023-01-13 ENCOUNTER — Encounter (HOSPITAL_COMMUNITY): Payer: Self-pay

## 2023-01-13 ENCOUNTER — Other Ambulatory Visit: Payer: Self-pay

## 2023-01-13 ENCOUNTER — Emergency Department (HOSPITAL_COMMUNITY): Payer: 59

## 2023-01-13 ENCOUNTER — Inpatient Hospital Stay (HOSPITAL_COMMUNITY)
Admission: EM | Admit: 2023-01-13 | Discharge: 2023-01-15 | DRG: 189 | Disposition: A | Discharge status: Home / Self Care | Payer: 59 | Attending: Internal Medicine | Admitting: Internal Medicine

## 2023-01-13 DIAGNOSIS — K219 Gastro-esophageal reflux disease without esophagitis: Secondary | ICD-10-CM | POA: Diagnosis present

## 2023-01-13 DIAGNOSIS — I255 Ischemic cardiomyopathy: Secondary | ICD-10-CM | POA: Diagnosis present

## 2023-01-13 DIAGNOSIS — S06330A Contusion and laceration of cerebrum, unspecified, without loss of consciousness, initial encounter: Secondary | ICD-10-CM | POA: Insufficient documentation

## 2023-01-13 DIAGNOSIS — E785 Hyperlipidemia, unspecified: Secondary | ICD-10-CM | POA: Diagnosis present

## 2023-01-13 DIAGNOSIS — J9601 Acute respiratory failure with hypoxia: Principal | ICD-10-CM | POA: Diagnosis present

## 2023-01-13 DIAGNOSIS — R0902 Hypoxemia: Secondary | ICD-10-CM

## 2023-01-13 DIAGNOSIS — S0633AA Contusion and laceration of cerebrum, unspecified, with loss of consciousness status unknown, initial encounter: Secondary | ICD-10-CM

## 2023-01-13 DIAGNOSIS — D75839 Thrombocytosis, unspecified: Secondary | ICD-10-CM | POA: Diagnosis present

## 2023-01-13 DIAGNOSIS — F064 Anxiety disorder due to known physiological condition: Secondary | ICD-10-CM | POA: Diagnosis not present

## 2023-01-13 DIAGNOSIS — I252 Old myocardial infarction: Secondary | ICD-10-CM

## 2023-01-13 DIAGNOSIS — Z8249 Family history of ischemic heart disease and other diseases of the circulatory system: Secondary | ICD-10-CM

## 2023-01-13 DIAGNOSIS — D6489 Other specified anemias: Secondary | ICD-10-CM | POA: Diagnosis present

## 2023-01-13 DIAGNOSIS — Z951 Presence of aortocoronary bypass graft: Secondary | ICD-10-CM

## 2023-01-13 DIAGNOSIS — Z886 Allergy status to analgesic agent status: Secondary | ICD-10-CM

## 2023-01-13 DIAGNOSIS — Z041 Encounter for examination and observation following transport accident: Secondary | ICD-10-CM | POA: Diagnosis not present

## 2023-01-13 DIAGNOSIS — I2510 Atherosclerotic heart disease of native coronary artery without angina pectoris: Secondary | ICD-10-CM | POA: Diagnosis present

## 2023-01-13 DIAGNOSIS — J9600 Acute respiratory failure, unspecified whether with hypoxia or hypercapnia: Secondary | ICD-10-CM | POA: Diagnosis present

## 2023-01-13 DIAGNOSIS — Z800 Family history of malignant neoplasm of digestive organs: Secondary | ICD-10-CM

## 2023-01-13 DIAGNOSIS — I493 Ventricular premature depolarization: Secondary | ICD-10-CM | POA: Diagnosis present

## 2023-01-13 DIAGNOSIS — Z79899 Other long term (current) drug therapy: Secondary | ICD-10-CM

## 2023-01-13 DIAGNOSIS — I714 Abdominal aortic aneurysm, without rupture, unspecified: Secondary | ICD-10-CM | POA: Diagnosis present

## 2023-01-13 DIAGNOSIS — Z7901 Long term (current) use of anticoagulants: Secondary | ICD-10-CM

## 2023-01-13 DIAGNOSIS — Z8673 Personal history of transient ischemic attack (TIA), and cerebral infarction without residual deficits: Secondary | ICD-10-CM

## 2023-01-13 DIAGNOSIS — I441 Atrioventricular block, second degree: Secondary | ICD-10-CM | POA: Diagnosis present

## 2023-01-13 DIAGNOSIS — Z83438 Family history of other disorder of lipoprotein metabolism and other lipidemia: Secondary | ICD-10-CM

## 2023-01-13 DIAGNOSIS — R0602 Shortness of breath: Secondary | ICD-10-CM | POA: Diagnosis not present

## 2023-01-13 DIAGNOSIS — F17210 Nicotine dependence, cigarettes, uncomplicated: Secondary | ICD-10-CM | POA: Diagnosis present

## 2023-01-13 DIAGNOSIS — S27321A Contusion of lung, unilateral, initial encounter: Secondary | ICD-10-CM | POA: Diagnosis present

## 2023-01-13 DIAGNOSIS — R609 Edema, unspecified: Secondary | ICD-10-CM | POA: Diagnosis not present

## 2023-01-13 DIAGNOSIS — J449 Chronic obstructive pulmonary disease, unspecified: Secondary | ICD-10-CM | POA: Diagnosis present

## 2023-01-13 DIAGNOSIS — D75838 Other thrombocytosis: Secondary | ICD-10-CM | POA: Diagnosis present

## 2023-01-13 DIAGNOSIS — Z833 Family history of diabetes mellitus: Secondary | ICD-10-CM

## 2023-01-13 DIAGNOSIS — T1490XA Injury, unspecified, initial encounter: Secondary | ICD-10-CM | POA: Diagnosis not present

## 2023-01-13 DIAGNOSIS — S066X0A Traumatic subarachnoid hemorrhage without loss of consciousness, initial encounter: Secondary | ICD-10-CM | POA: Diagnosis present

## 2023-01-13 DIAGNOSIS — I10 Essential (primary) hypertension: Secondary | ICD-10-CM | POA: Diagnosis not present

## 2023-01-13 DIAGNOSIS — I959 Hypotension, unspecified: Secondary | ICD-10-CM | POA: Diagnosis present

## 2023-01-13 DIAGNOSIS — I480 Paroxysmal atrial fibrillation: Secondary | ICD-10-CM | POA: Diagnosis present

## 2023-01-13 DIAGNOSIS — Z803 Family history of malignant neoplasm of breast: Secondary | ICD-10-CM

## 2023-01-13 DIAGNOSIS — Z888 Allergy status to other drugs, medicaments and biological substances status: Secondary | ICD-10-CM

## 2023-01-13 DIAGNOSIS — S0990XA Unspecified injury of head, initial encounter: Secondary | ICD-10-CM | POA: Diagnosis not present

## 2023-01-13 DIAGNOSIS — Z885 Allergy status to narcotic agent status: Secondary | ICD-10-CM

## 2023-01-13 DIAGNOSIS — Z743 Need for continuous supervision: Secondary | ICD-10-CM | POA: Diagnosis not present

## 2023-01-13 DIAGNOSIS — R0689 Other abnormalities of breathing: Secondary | ICD-10-CM | POA: Diagnosis not present

## 2023-01-13 DIAGNOSIS — S062X0A Diffuse traumatic brain injury without loss of consciousness, initial encounter: Secondary | ICD-10-CM

## 2023-01-13 HISTORY — DX: Contusion and laceration of cerebrum, unspecified, without loss of consciousness, initial encounter: S06.330A

## 2023-01-13 HISTORY — DX: Acute respiratory failure, unspecified whether with hypoxia or hypercapnia: J96.00

## 2023-01-13 HISTORY — DX: Contusion and laceration of cerebrum, unspecified, with loss of consciousness status unknown, initial encounter: S06.33AA

## 2023-01-13 LAB — COMPREHENSIVE METABOLIC PANEL
ALT: 18 U/L (ref 0–44)
AST: 22 U/L (ref 15–41)
Albumin: 3.6 g/dL (ref 3.5–5.0)
Alkaline Phosphatase: 71 U/L (ref 38–126)
Anion gap: 11 (ref 5–15)
BUN: 17 mg/dL (ref 8–23)
CO2: 25 mmol/L (ref 22–32)
Calcium: 9.1 mg/dL (ref 8.9–10.3)
Chloride: 101 mmol/L (ref 98–111)
Creatinine, Ser: 0.82 mg/dL (ref 0.44–1.00)
GFR, Estimated: >60 mL/min (ref >60)
Glucose, Bld: 106 mg/dL — ABNORMAL HIGH (ref 70–99)
Potassium: 3.7 mmol/L (ref 3.5–5.1)
Sodium: 137 mmol/L (ref 135–145)
Total Bilirubin: 0.3 mg/dL (ref 0.3–1.2)
Total Protein: 7 g/dL (ref 6.5–8.1)

## 2023-01-13 LAB — CBC
HCT: 37.4 % (ref 36.0–46.0)
Hemoglobin: 11.6 g/dL — ABNORMAL LOW (ref 12.0–15.0)
MCH: 26.9 pg (ref 26.0–34.0)
MCHC: 31 g/dL (ref 30.0–36.0)
MCV: 86.6 fL (ref 80.0–100.0)
Platelets: 423 10*3/uL — ABNORMAL HIGH (ref 150–400)
RBC: 4.32 MIL/uL (ref 3.87–5.11)
RDW: 15.7 % — ABNORMAL HIGH (ref 11.5–15.5)
WBC: 9.9 10*3/uL (ref 4.0–10.5)
nRBC: 0 % (ref 0.0–0.2)

## 2023-01-13 LAB — I-STAT CHEM 8, ED
BUN: 19 mg/dL (ref 8–23)
Calcium, Ion: 1.19 mmol/L (ref 1.15–1.40)
Chloride: 104 mmol/L (ref 98–111)
Creatinine, Ser: 0.9 mg/dL (ref 0.44–1.00)
Glucose, Bld: 102 mg/dL — ABNORMAL HIGH (ref 70–99)
HCT: 36 % (ref 36.0–46.0)
Hemoglobin: 12.2 g/dL (ref 12.0–15.0)
Potassium: 3.8 mmol/L (ref 3.5–5.1)
Sodium: 138 mmol/L (ref 135–145)
TCO2: 28 mmol/L (ref 22–32)

## 2023-01-13 LAB — URINALYSIS, ROUTINE W REFLEX MICROSCOPIC
Bacteria, UA: NONE SEEN
Bilirubin Urine: NEGATIVE
Glucose, UA: NEGATIVE mg/dL
Hgb urine dipstick: NEGATIVE
Ketones, ur: NEGATIVE mg/dL
Nitrite: NEGATIVE
Protein, ur: NEGATIVE mg/dL
Specific Gravity, Urine: 1.01 (ref 1.005–1.030)
pH: 6 (ref 5.0–8.0)

## 2023-01-13 LAB — TROPONIN I (HIGH SENSITIVITY)
Troponin I (High Sensitivity): 13 ng/L (ref <18)
Troponin I (High Sensitivity): 15 ng/L (ref <18)

## 2023-01-13 LAB — ETHANOL: Alcohol, Ethyl (B): <10 mg/dL (ref <10)

## 2023-01-13 MED ORDER — ACETAMINOPHEN 325 MG PO TABS: 650.0000 mg | ORAL_TABLET | Freq: Four times a day (QID) | ORAL | Status: DC | PRN

## 2023-01-13 MED ORDER — CARVEDILOL 3.125 MG PO TABS
1.5625 mg | ORAL_TABLET | Freq: Two times a day (BID) | ORAL | Status: DC
  Administered 2023-01-14: 1.5625 mg via ORAL
  Filled 2023-01-13: qty 1

## 2023-01-13 MED ORDER — FENTANYL CITRATE PF 50 MCG/ML IJ SOSY
12.5000 ug | PREFILLED_SYRINGE | INTRAMUSCULAR | Status: DC | PRN
  Administered 2023-01-14 – 2023-01-15 (×3): 50 ug via INTRAVENOUS
  Filled 2023-01-13 (×4): qty 1

## 2023-01-13 MED ORDER — ONDANSETRON HCL 4 MG/2ML IJ SOLN
4.0000 mg | Freq: Once | INTRAMUSCULAR | Status: AC
  Administered 2023-01-13: 4 mg via INTRAVENOUS
  Filled 2023-01-13: qty 2

## 2023-01-13 MED ORDER — MORPHINE SULFATE (PF) 4 MG/ML IV SOLN
4.0000 mg | Freq: Once | INTRAVENOUS | Status: AC
  Administered 2023-01-13: 4 mg via INTRAVENOUS
  Filled 2023-01-13: qty 1

## 2023-01-13 MED ORDER — ONDANSETRON HCL 4 MG/2ML IJ SOLN: 4.0000 mg | Freq: Four times a day (QID) | INTRAMUSCULAR | Status: DC | PRN

## 2023-01-13 MED ORDER — LORATADINE 10 MG PO TABS
10.0000 mg | ORAL_TABLET | Freq: Every evening | ORAL | Status: DC
  Administered 2023-01-14: 10 mg via ORAL
  Filled 2023-01-13: qty 1

## 2023-01-13 MED ORDER — PRAVASTATIN SODIUM 40 MG PO TABS
40.0000 mg | ORAL_TABLET | Freq: Every evening | ORAL | Status: DC
  Administered 2023-01-14: 40 mg via ORAL
  Filled 2023-01-13: qty 1

## 2023-01-13 MED ORDER — ONDANSETRON HCL 4 MG PO TABS: 4.0000 mg | ORAL_TABLET | Freq: Four times a day (QID) | ORAL | Status: DC | PRN

## 2023-01-13 MED ORDER — LACTATED RINGERS IV SOLN: INTRAVENOUS | Status: DC

## 2023-01-13 MED ORDER — TRAMADOL HCL 50 MG PO TABS
25.0000 mg | ORAL_TABLET | Freq: Two times a day (BID) | ORAL | Status: DC | PRN
  Administered 2023-01-14 – 2023-01-15 (×3): 50 mg via ORAL
  Filled 2023-01-13 (×3): qty 1

## 2023-01-13 MED ORDER — IOHEXOL 350 MG/ML SOLN
75.0000 mL | Freq: Once | INTRAVENOUS | Status: AC | PRN
  Administered 2023-01-13: 75 mL via INTRAVENOUS

## 2023-01-13 MED ORDER — METHOCARBAMOL 500 MG PO TABS
500.0000 mg | ORAL_TABLET | Freq: Three times a day (TID) | ORAL | Status: DC | PRN
  Administered 2023-01-14 (×3): 500 mg via ORAL
  Filled 2023-01-13 (×3): qty 1

## 2023-01-13 MED ORDER — ACETAMINOPHEN 650 MG RE SUPP: 650.0000 mg | Freq: Four times a day (QID) | RECTAL | Status: DC | PRN

## 2023-01-13 MED ORDER — SODIUM CHLORIDE 0.9 % IV BOLUS
1000.0000 mL | Freq: Once | INTRAVENOUS | Status: AC
  Administered 2023-01-13: 1000 mL via INTRAVENOUS

## 2023-01-13 MED ORDER — POLYETHYLENE GLYCOL 3350 17 G PO PACK: 17.0000 g | PACK | Freq: Every day | ORAL | Status: DC | PRN
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
## 2023-01-13 NOTE — Assessment & Plan Note (Signed)
-  Continue Pravachol ?
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
## 2023-01-13 NOTE — Assessment & Plan Note (Signed)
Stable in appearance
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
## 2023-01-13 NOTE — Assessment & Plan Note (Signed)
Likely related to mild anemia, stable
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
## 2023-01-13 NOTE — Assessment & Plan Note (Signed)
Continue carvedilol.  She is on a very low-dose of this.  At present she is mildly hypotensive and would hold if blood pressure is less than 90

## 2023-01-13 NOTE — Assessment & Plan Note (Signed)
With some residual weakness

## 2023-01-13 NOTE — Assessment & Plan Note (Signed)
EDP consulted neurosurgery who reviewed scans.  They asked for repeat in 6 hours.  If there is no significant change they recommend no intervention.

## 2023-01-13 NOTE — ED Notes (Signed)
Patient placed onto 2 lpm via [place] as her room O2 sat was dropping into the 80s regularly.

## 2023-01-13 NOTE — Assessment & Plan Note (Signed)
Stable Continue carvedilol Continue Pravachol Negative trops
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
## 2023-01-14 ENCOUNTER — Observation Stay (HOSPITAL_COMMUNITY): Payer: 59

## 2023-01-14 DIAGNOSIS — F17210 Nicotine dependence, cigarettes, uncomplicated: Secondary | ICD-10-CM | POA: Diagnosis not present

## 2023-01-14 DIAGNOSIS — Z7901 Long term (current) use of anticoagulants: Secondary | ICD-10-CM | POA: Diagnosis not present

## 2023-01-14 DIAGNOSIS — I480 Paroxysmal atrial fibrillation: Secondary | ICD-10-CM | POA: Diagnosis not present

## 2023-01-14 DIAGNOSIS — R0602 Shortness of breath: Secondary | ICD-10-CM | POA: Diagnosis not present

## 2023-01-14 DIAGNOSIS — Z885 Allergy status to narcotic agent status: Secondary | ICD-10-CM | POA: Diagnosis not present

## 2023-01-14 DIAGNOSIS — I10 Essential (primary) hypertension: Secondary | ICD-10-CM | POA: Diagnosis not present

## 2023-01-14 DIAGNOSIS — I441 Atrioventricular block, second degree: Secondary | ICD-10-CM | POA: Diagnosis not present

## 2023-01-14 DIAGNOSIS — J9600 Acute respiratory failure, unspecified whether with hypoxia or hypercapnia: Secondary | ICD-10-CM | POA: Diagnosis not present

## 2023-01-14 DIAGNOSIS — Z886 Allergy status to analgesic agent status: Secondary | ICD-10-CM | POA: Diagnosis not present

## 2023-01-14 DIAGNOSIS — D6489 Other specified anemias: Secondary | ICD-10-CM | POA: Diagnosis not present

## 2023-01-14 DIAGNOSIS — Z888 Allergy status to other drugs, medicaments and biological substances status: Secondary | ICD-10-CM | POA: Diagnosis not present

## 2023-01-14 DIAGNOSIS — I2510 Atherosclerotic heart disease of native coronary artery without angina pectoris: Secondary | ICD-10-CM | POA: Diagnosis not present

## 2023-01-14 DIAGNOSIS — I959 Hypotension, unspecified: Secondary | ICD-10-CM | POA: Diagnosis not present

## 2023-01-14 DIAGNOSIS — Z8249 Family history of ischemic heart disease and other diseases of the circulatory system: Secondary | ICD-10-CM | POA: Diagnosis not present

## 2023-01-14 DIAGNOSIS — S066X0A Traumatic subarachnoid hemorrhage without loss of consciousness, initial encounter: Secondary | ICD-10-CM | POA: Diagnosis not present

## 2023-01-14 DIAGNOSIS — S062X0A Diffuse traumatic brain injury without loss of consciousness, initial encounter: Secondary | ICD-10-CM | POA: Diagnosis not present

## 2023-01-14 DIAGNOSIS — E785 Hyperlipidemia, unspecified: Secondary | ICD-10-CM | POA: Diagnosis not present

## 2023-01-14 DIAGNOSIS — I252 Old myocardial infarction: Secondary | ICD-10-CM | POA: Diagnosis not present

## 2023-01-14 DIAGNOSIS — S06330A Contusion and laceration of cerebrum, unspecified, without loss of consciousness, initial encounter: Secondary | ICD-10-CM | POA: Diagnosis not present

## 2023-01-14 DIAGNOSIS — D75838 Other thrombocytosis: Secondary | ICD-10-CM | POA: Diagnosis not present

## 2023-01-14 DIAGNOSIS — Z79899 Other long term (current) drug therapy: Secondary | ICD-10-CM | POA: Diagnosis not present

## 2023-01-14 DIAGNOSIS — Z951 Presence of aortocoronary bypass graft: Secondary | ICD-10-CM | POA: Diagnosis not present

## 2023-01-14 DIAGNOSIS — I255 Ischemic cardiomyopathy: Secondary | ICD-10-CM | POA: Diagnosis not present

## 2023-01-14 DIAGNOSIS — J9601 Acute respiratory failure with hypoxia: Secondary | ICD-10-CM | POA: Diagnosis not present

## 2023-01-14 DIAGNOSIS — I714 Abdominal aortic aneurysm, without rupture, unspecified: Secondary | ICD-10-CM | POA: Diagnosis not present

## 2023-01-14 DIAGNOSIS — J449 Chronic obstructive pulmonary disease, unspecified: Secondary | ICD-10-CM | POA: Diagnosis not present

## 2023-01-14 DIAGNOSIS — S27321A Contusion of lung, unilateral, initial encounter: Secondary | ICD-10-CM | POA: Diagnosis not present

## 2023-01-14 LAB — CBC WITH DIFFERENTIAL/PLATELET
Abs Immature Granulocytes: 0.03 10*3/uL (ref 0.00–0.07)
Basophils Absolute: 0.1 10*3/uL (ref 0.0–0.1)
Basophils Relative: 1 %
Eosinophils Absolute: 0.2 10*3/uL (ref 0.0–0.5)
Eosinophils Relative: 2 %
HCT: 33.6 % — ABNORMAL LOW (ref 36.0–46.0)
Hemoglobin: 10.3 g/dL — ABNORMAL LOW (ref 12.0–15.0)
Immature Granulocytes: 0 %
Lymphocytes Relative: 17 %
Lymphs Abs: 1.6 10*3/uL (ref 0.7–4.0)
MCH: 27 pg (ref 26.0–34.0)
MCHC: 30.7 g/dL (ref 30.0–36.0)
MCV: 88.2 fL (ref 80.0–100.0)
Monocytes Absolute: 0.9 10*3/uL (ref 0.1–1.0)
Monocytes Relative: 10 %
Neutro Abs: 6.7 10*3/uL (ref 1.7–7.7)
Neutrophils Relative %: 70 %
Platelets: 351 10*3/uL (ref 150–400)
RBC: 3.81 MIL/uL — ABNORMAL LOW (ref 3.87–5.11)
RDW: 15.8 % — ABNORMAL HIGH (ref 11.5–15.5)
WBC: 9.5 10*3/uL (ref 4.0–10.5)
nRBC: 0 % (ref 0.0–0.2)

## 2023-01-14 LAB — I-STAT ARTERIAL BLOOD GAS, ED
Acid-base deficit: 1 mmol/L (ref 0.0–2.0)
Bicarbonate: 25.3 mmol/L (ref 20.0–28.0)
Calcium, Ion: 1.26 mmol/L (ref 1.15–1.40)
HCT: 32 % — ABNORMAL LOW (ref 36.0–46.0)
Hemoglobin: 10.9 g/dL — ABNORMAL LOW (ref 12.0–15.0)
O2 Saturation: 90 %
Potassium: 3.8 mmol/L (ref 3.5–5.1)
Sodium: 138 mmol/L (ref 135–145)
TCO2: 27 mmol/L (ref 22–32)
pCO2 arterial: 50.1 mmHg — ABNORMAL HIGH (ref 32–48)
pH, Arterial: 7.311 — ABNORMAL LOW (ref 7.35–7.45)
pO2, Arterial: 65 mmHg — ABNORMAL LOW (ref 83–108)

## 2023-01-14 LAB — BRAIN NATRIURETIC PEPTIDE: B Natriuretic Peptide: 333 pg/mL — ABNORMAL HIGH (ref 0.0–100.0)

## 2023-01-14 MED ORDER — FUROSEMIDE 10 MG/ML IJ SOLN
20.0000 mg | Freq: Once | INTRAMUSCULAR | Status: AC
  Administered 2023-01-14: 20 mg via INTRAVENOUS
  Filled 2023-01-14: qty 2
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
## 2023-01-14 NOTE — ED Notes (Signed)
Pt assisted with bedpan for urinary needs. 

## 2023-01-14 NOTE — Progress Notes (Signed)
Patient and spouse agreeable to completing admission questions at this time.  Patient and family pleasant when answering questions.

## 2023-01-14 NOTE — Progress Notes (Signed)
Patient and spouse report being upset due to "poor communication."  Spouse states that no provider has seen them since around 5 pm yesterday 01/13/23 and they have not been given the results of patient's CT scan.  Patient refusing to be admitted until MD discusses care plant with them.   They are asking to be discharged.  Attending notified via secure chat.  Unable to complete nursing admissions navigator due to patient and spouse refusal to answer questions.

## 2023-01-14 NOTE — ED Notes (Signed)
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 (has no administration in time range)  morphine (PF) 4 MG/ML injection 4 mg (4 mg Intravenous Given 01/13/23 2000)  ondansetron (ZOFRAN) injection 4 mg (4 mg Intravenous Given 01/13/23 2000)  sodium chloride 0.9 % bolus 1,000 mL (0 mLs Intravenous Stopped 01/13/23 2149)  iohexol (OMNIPAQUE) 350  MG/ML injection 75 mL (75 mLs Intravenous Contrast Given 01/13/23 2144)    Mobility walks     Focused Assessments Cardiac Assessment Handoff:  Cardiac Rhythm: Sinus tachycardia Lab Results  Component Value Date   TROPONINI 6.71 (HH) 11/16/2017   Lab Results  Component Value Date   DDIMER 1.08 (H) 11/16/2017   Does the Patient currently have chest pain? No    R Recommendations: See Admitting Provider Note  Report given to:   Additional Notes:
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
## 2023-01-15 DIAGNOSIS — S06330A Contusion and laceration of cerebrum, unspecified, without loss of consciousness, initial encounter: Secondary | ICD-10-CM | POA: Diagnosis not present

## 2023-01-15 DIAGNOSIS — J9600 Acute respiratory failure, unspecified whether with hypoxia or hypercapnia: Secondary | ICD-10-CM | POA: Diagnosis not present

## 2023-01-15 MED ORDER — FUROSEMIDE 20 MG PO TABS: 20.0000 mg | ORAL_TABLET | Freq: Every day | ORAL | 0 refills | Status: DC

## 2023-01-15 MED ORDER — POLYETHYLENE GLYCOL 3350 17 G PO PACK: 17.0000 g | PACK | Freq: Every day | ORAL | 0 refills | Status: DC | PRN

## 2023-01-15 MED ORDER — CETIRIZINE HCL 10 MG PO TABS: 10.0000 mg | ORAL_TABLET | Freq: Every day | ORAL | 0 refills | Status: DC

## 2023-01-15 MED ORDER — METHOCARBAMOL 500 MG PO TABS: 500.0000 mg | ORAL_TABLET | Freq: Three times a day (TID) | ORAL | 0 refills | Status: AC | PRN

## 2023-01-15 MED ORDER — FUROSEMIDE 20 MG PO TABS
20.0000 mg | ORAL_TABLET | Freq: Every day | ORAL | Status: DC
  Administered 2023-01-15: 20 mg via ORAL
  Filled 2023-01-15: qty 1

## 2023-01-15 NOTE — Progress Notes (Signed)
MADE MULTIPLE ROUNDS TO PATIENT'S ROOM EACH TIME PT WAS RESTING QUIETLY WITH EYES CLOSED.
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
## 2023-01-17 ENCOUNTER — Telehealth: Payer: Self-pay
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
## 2023-01-17 NOTE — Transitions of Care (Post Inpatient/ED Visit) (Signed)
   01/17/2023  Name: [name] MRN: [id number] DOB: Mar 10, 1961  Today's TOC FU Call Status: Today's TOC FU Call Status:: Unsuccessul Call (1st Attempt) Unsuccessful Call (1st Attempt) Date: 01/10/23  Attempted to reach the patient regarding the most recent Inpatient/ED visit.  Follow Up Plan: Additional outreach attempts will be made to reach the patient to complete the Transitions of Care (Post Inpatient/ED visit) call.   Antionette Fairy, RN,BSN,CCM [facility] Care Management Care Management Community Coordinator Direct Phone: [phone] Toll Free: [phone] Fax: [phone]

## 2023-01-19 ENCOUNTER — Ambulatory Visit
Admission: RE | Admit: 2023-01-19 | Discharge: 2023-01-19 | Disposition: A | Discharge status: Home / Self Care | Payer: 59 | Source: Ambulatory Visit | Attending: Family Medicine | Admitting: Family Medicine

## 2023-01-19 DIAGNOSIS — R928 Other abnormal and inconclusive findings on diagnostic imaging of breast: Secondary | ICD-10-CM

## 2023-01-19 DIAGNOSIS — R921 Mammographic calcification found on diagnostic imaging of breast: Secondary | ICD-10-CM | POA: Diagnosis not present

## 2023-01-20 ENCOUNTER — Ambulatory Visit: Payer: 59 | Attending: Student | Admitting: Student

## 2023-01-20 ENCOUNTER — Other Ambulatory Visit: Payer: Self-pay | Admitting: Family Medicine

## 2023-01-20 ENCOUNTER — Encounter: Payer: Self-pay | Admitting: Student

## 2023-01-20 VITALS — BP 120/68 | HR 94 | Ht 60.0 in | Wt 127.0 lb

## 2023-01-20 DIAGNOSIS — I480 Paroxysmal atrial fibrillation: Secondary | ICD-10-CM | POA: Diagnosis not present

## 2023-01-20 DIAGNOSIS — E785 Hyperlipidemia, unspecified: Secondary | ICD-10-CM | POA: Diagnosis not present

## 2023-01-20 DIAGNOSIS — I5032 Chronic diastolic (congestive) heart failure: Secondary | ICD-10-CM

## 2023-01-20 DIAGNOSIS — R921 Mammographic calcification found on diagnostic imaging of breast: Secondary | ICD-10-CM

## 2023-01-20 DIAGNOSIS — I6523 Occlusion and stenosis of bilateral carotid arteries: Secondary | ICD-10-CM | POA: Diagnosis not present

## 2023-01-20 DIAGNOSIS — R001 Bradycardia, unspecified: Secondary | ICD-10-CM

## 2023-01-20 DIAGNOSIS — Z79899 Other long term (current) drug therapy: Secondary | ICD-10-CM

## 2023-01-20 DIAGNOSIS — I2510 Atherosclerotic heart disease of native coronary artery without angina pectoris: Secondary | ICD-10-CM

## 2023-01-20 MED ORDER — RIVAROXABAN 20 MG PO TABS: 20.0000 mg | ORAL_TABLET | Freq: Every day | ORAL | 1 refills | Status: DC
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
## 2023-01-20 NOTE — Patient Instructions (Signed)
Medication Instructions:  Your physician recommends that you continue on your current medications as directed. Please refer to the Current Medication list given to you today.  *If you need a refill on your cardiac medications before your next appointment, please call your pharmacy*   Lab Work: BMET BNP If you have labs (blood work) drawn today and your tests are completely normal, you will receive your results only by: MyChart Message (if you have MyChart) OR A paper copy in the mail If you have any lab test that is abnormal or we need to change your treatment, we will call you to review the results.   Testing/Procedures: None   Follow-Up: At [facility], you and your health needs are our priority.  As part of our continuing mission to provide you with exceptional heart care, we have created designated Provider Care Teams.  These Care Teams include your primary Cardiologist (physician) and Advanced Practice Providers (APPs -  Physician Assistants and Nurse Practitioners) who all work together to provide you with the care you need, when you need it.  We recommend signing up for the patient portal called "MyChart".  Sign up information is provided on this After Visit Summary.  MyChart is used to connect with patients for Virtual Visits (Telemedicine).  Patients are able to view lab/test results, encounter notes, upcoming appointments, etc.  Non-urgent messages can be sent to your provider as well.   To learn more about what you can do with MyChart, go to [email or web address].    Your next appointment:   6 month(s)  Provider:   Nona Dell, MD    Other Instructions

## 2023-01-27 ENCOUNTER — Encounter: Payer: Self-pay | Admitting: Internal Medicine

## 2023-01-27 ENCOUNTER — Ambulatory Visit: Payer: 59 | Admitting: Internal Medicine

## 2023-01-27 VITALS — BP 122/80 | HR 110 | Ht 60.0 in | Wt 125.8 lb

## 2023-01-27 DIAGNOSIS — E063 Autoimmune thyroiditis: Secondary | ICD-10-CM

## 2023-01-27 LAB — T3, FREE: T3, Free: 3.2 pg/mL (ref 2.3–4.2)

## 2023-01-27 LAB — TSH: TSH: 4.65 u[IU]/mL (ref 0.35–5.50)

## 2023-01-27 LAB — T4, FREE: Free T4: 0.83 ng/dL (ref 0.60–1.60)
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
## 2023-01-27 NOTE — Patient Instructions (Signed)
Please stop at the lab.  In case we need to start Levothyroxine, take the thyroid hormone every day, with water, at least 30 minutes before breakfast, separated by at least 4 hours from: - acid reflux medications - calcium - iron - multivitamins  Please come back for a follow-up appointment in 1 year.

## 2023-01-31 ENCOUNTER — Ambulatory Visit (HOSPITAL_COMMUNITY)
Admission: RE | Admit: 2023-01-31 | Discharge: 2023-01-31 | Disposition: A | Discharge status: Home / Self Care | Payer: 59 | Source: Ambulatory Visit | Attending: Cardiology | Admitting: Cardiology

## 2023-01-31 ENCOUNTER — Telehealth: Payer: Self-pay

## 2023-01-31 DIAGNOSIS — I429 Cardiomyopathy, unspecified: Secondary | ICD-10-CM | POA: Diagnosis not present

## 2023-01-31 DIAGNOSIS — I428 Other cardiomyopathies: Secondary | ICD-10-CM | POA: Diagnosis not present

## 2023-01-31 DIAGNOSIS — I25119 Atherosclerotic heart disease of native coronary artery with unspecified angina pectoris: Secondary | ICD-10-CM

## 2023-01-31 DIAGNOSIS — I6523 Occlusion and stenosis of bilateral carotid arteries: Secondary | ICD-10-CM | POA: Diagnosis not present

## 2023-01-31 LAB — ECHOCARDIOGRAM COMPLETE
AR max vel: 1.73 cm2
AV Area VTI: 1.63 cm2
AV Area mean vel: 1.72 cm2
AV Mean grad: 3.4 mmHg
AV Peak grad: 7.6 mmHg
Ao pk vel: 1.38 m/s
Area-P 1/2: 6.32 cm2
Calc EF: 40.6 %
S' Lateral: 4 cm
Single Plane A2C EF: 40.1 %
Single Plane A4C EF: 38.9 %

## 2023-01-31 NOTE — Progress Notes (Signed)
*  PRELIMINARY RESULTS* Echocardiogram 2D Echocardiogram has been performed.  [name] 01/31/2023, 2:02 PM

## 2023-01-31 NOTE — Telephone Encounter (Signed)
Patient notified and verbalized understanding. Patient had no questions or concerns at this time. PCP copied 

## 2023-01-31 NOTE — Telephone Encounter (Signed)
-----   Message from Jonelle Sidle, MD sent at 01/31/2023  4:26 PM EDT ----- Results reviewed.  LVEF mildly reduced at 45 to 50% (previously 55%).  She was clinically stable as of her most recent office visit.  Keep scheduled follow-up on current medications.

## 2023-02-07 ENCOUNTER — Other Ambulatory Visit: Payer: Self-pay | Admitting: Internal Medicine

## 2023-02-16 ENCOUNTER — Encounter: Payer: Self-pay | Admitting: Physical Medicine & Rehabilitation

## 2023-02-16 ENCOUNTER — Encounter: Payer: 59 | Attending: Physical Medicine & Rehabilitation | Admitting: Physical Medicine & Rehabilitation

## 2023-02-16 VITALS — BP 132/74 | HR 96 | Ht 60.0 in | Wt 127.0 lb

## 2023-02-16 DIAGNOSIS — G8929 Other chronic pain: Secondary | ICD-10-CM | POA: Insufficient documentation

## 2023-02-16 DIAGNOSIS — M25562 Pain in left knee: Secondary | ICD-10-CM | POA: Insufficient documentation

## 2023-02-16 DIAGNOSIS — M25561 Pain in right knee: Secondary | ICD-10-CM | POA: Diagnosis not present

## 2023-02-16 MED ORDER — LIDOCAINE HCL 1 % IJ SOLN
4.0000 mL | Freq: Once | INTRAMUSCULAR | Status: AC
  Administered 2023-02-16: 4 mL

## 2023-02-16 MED ORDER — BETAMETHASONE SOD PHOS & ACET 6 (3-3) MG/ML IJ SUSP
6.0000 mg | Freq: Once | INTRAMUSCULAR | Status: AC
Start: 2023-02-16 — End: 2023-02-16
  Administered 2023-02-16: 6 mg via INTRAMUSCULAR
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
## 2023-02-23 ENCOUNTER — Telehealth: Payer: Self-pay | Admitting: *Deleted

## 2023-02-23 NOTE — Telephone Encounter (Signed)
Patient aware.

## 2023-02-23 NOTE — Telephone Encounter (Signed)
Patient is requesting another form of pain medication other than Tramadol. She says she has used Oxycodone 5 mg in the past and it relieved her pain. Call back # [phone]

## 2023-03-22 ENCOUNTER — Ambulatory Visit (INDEPENDENT_AMBULATORY_CARE_PROVIDER_SITE_OTHER): Payer: 59

## 2023-03-22 ENCOUNTER — Encounter: Payer: Self-pay | Admitting: Cardiology

## 2023-03-22 ENCOUNTER — Ambulatory Visit: Payer: 59 | Attending: Cardiology | Admitting: Cardiology

## 2023-03-22 VITALS — BP 118/70 | HR 105 | Ht 60.0 in | Wt 129.2 lb

## 2023-03-22 DIAGNOSIS — I493 Ventricular premature depolarization: Secondary | ICD-10-CM

## 2023-03-22 DIAGNOSIS — I480 Paroxysmal atrial fibrillation: Secondary | ICD-10-CM

## 2023-03-22 DIAGNOSIS — D6869 Other thrombophilia: Secondary | ICD-10-CM
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
## 2023-03-22 NOTE — Progress Notes (Unsigned)
Enrolled patient for a 7 day Zio XT monitor to be mailed to patients home.  

## 2023-03-22 NOTE — Patient Instructions (Signed)
Medication Instructions:  Your physician recommends that you continue on your current medications as directed. Please refer to the Current Medication list given to you today.  *If you need a refill on your cardiac medications before your next appointment, please call your pharmacy*   Lab Work: None ordered   Testing/Procedures:                           ZIO XT- Long Term Monitor Instructions  Your physician has requested you wear a ZIO patch monitor for 7 days.  This is a single patch monitor. [employer] supplies one patch monitor per enrollment. Additional stickers are not available. Please do not apply patch if you will be having a Nuclear Stress Test,  Echocardiogram, Cardiac CT, MRI, or Chest Xray during the period you would be wearing the  monitor. The patch cannot be worn during these tests. You cannot remove and re-apply the  ZIO XT patch monitor.  Your ZIO patch monitor will be mailed 3 day USPS to your address on file. It may take 3-5 days  to receive your monitor after you have been enrolled.  Once you have received your monitor, please review the enclosed instructions. Your monitor  has already been registered assigning a specific monitor serial # to you.  Billing and Patient Assistance Program Information  We have supplied [employer] with any of your insurance information on file for billing purposes. [employer] offers a sliding scale Patient Assistance Program for patients that do not have  insurance, or whose insurance does not completely cover the cost of the ZIO monitor.  You must apply for the Patient Assistance Program to qualify for this discounted rate.  To apply, please call [employer] at [phone], select option 4, select option 2, ask to apply for  Patient Assistance Program. [employer] will ask your household income, and how many people  are in your household. They will quote your out-of-pocket cost based on that information.  [employer] will also be able to set up a  12-month, interest-free payment plan if needed.  Applying the monitor   Shave hair from upper left chest.  Hold abrader disc by orange tab. Rub abrader in 40 strokes over the upper left chest as  indicated in your monitor instructions.  Clean area with 4 enclosed alcohol pads. Let dry.  Apply patch as indicated in monitor instructions. Patch will be placed under collarbone on left  side of chest with arrow pointing upward.  Rub patch adhesive wings for 2 minutes. Remove white label marked "1". Remove the white  label marked "2". Rub patch adhesive wings for 2 additional minutes.  While looking in a mirror, press and release button in center of patch. A small green light will  flash 3-4 times. This will be your only indicator that the monitor has been turned on.  Do not shower for the first 24 hours. You may shower after the first 24 hours.  Press the button if you feel a symptom. You will hear a small click. Record Date, Time and  Symptom in the Patient Logbook.  When you are ready to remove the patch, follow instructions on the last 2 pages of Patient  Logbook. Stick patch monitor onto the last page of Patient Logbook.  Place Patient Logbook in the blue and white box. Use locking tab on box and tape box closed  securely. The blue and white box has prepaid postage on it. Please place it in the   mailbox as  soon as possible. Your physician should have your test results approximately 7 days after the  monitor has been mailed back to [facility].  Call [facility] Customer Care at [phone] if you have questions regarding  your ZIO XT patch monitor. Call them immediately if you see an orange light blinking on your  monitor.  If your monitor falls off in less than 4 days, contact our Monitor department at [phone].  If your monitor becomes loose or falls off after 4 days call [employer] at [phone] for  suggestions on securing your monitor   Follow-Up: At [facility],  you and your health needs are our priority.  As part of our continuing mission to provide you with exceptional heart care, we have created designated Provider Care Teams.  These Care Teams include your primary Cardiologist (physician) and Advanced Practice Providers (APPs -  Physician Assistants and Nurse Practitioners) who all work together to provide you with the care you need, when you need it.  Your next appointment:   1 year(s)  The format for your next appointment:   In Person  Provider:   You will see one of the following Advanced Practice Providers on your designated Care Team:   Francis Dowse, [place] Casimiro Needle "Mardelle Matte" Lanna Poche, [place]   Thank you for choosing [facility]!!   Dory Horn, RN [phone]        2

## 2023-03-24 NOTE — Progress Notes (Signed)
  PROCEDURE RECORD [facility]   Name: [name] DOB:02/14/1961 MRN: [id number]  Date:03/24/2023  Physician: Claudette Laws, MD    Nurse/CMA: Charise Carwin MA  Allergies:  Allergies  Allergen Reactions   Hydromorphone Other (See Comments)    Hallucinations Other reaction(s): Hallucination   Lopressor [Metoprolol Tartrate] Swelling    Angioedema   Sacubitril-Valsartan Swelling and Rash    Welts, possible angioedema  Rash   Allegra Allergy [Fexofenadine Hcl] Hives   Fexofenadine Hives   Lasix [Furosemide] Other (See Comments)    Pancreatitis   Nsaids Other (See Comments)    On Xarelto Not taking d/t abdominal pain   Gabapentin Rash   Gabapentin (Once-Daily) Rash   Lipitor [Atorvastatin] Diarrhea    Consent Signed: Yes.    Is patient diabetic? No.  CBG today? N/A  Pregnant: No. LMP: No LMP recorded (lmp unknown). Patient is postmenopausal. (age 62-55)  Anticoagulants: yes (Xarelto yesterday) Anti-inflammatory: no Antibiotics: no  Procedure:  Left Genicular Nerve Block  Position: Supine Start Time: 2:33 pm  End Time: 2:43 pm  Fluoro Time: 30  RN/CMA Ysidro Ramsay Jordanna Hendrie    Time 2:13 PM 2:47 PM    BP 107/70 156/75    Pulse 78 87    Respirations 16 16    O2 Sat 95 95    S/S 6 6    Pain Level 7/10 5/10     D/C home with [name], patient A & O X 3, D/C instructions reviewed, and sits independently.

## 2023-03-29 ENCOUNTER — Telehealth: Payer: Self-pay | Admitting: Student

## 2023-03-29 ENCOUNTER — Telehealth: Payer: Self-pay | Admitting: Cardiology

## 2023-03-29 MED ORDER — RIVAROXABAN 20 MG PO TABS: 20.0000 mg | ORAL_TABLET | Freq: Every day | ORAL | 0 refills | Status: DC

## 2023-03-29 NOTE — Telephone Encounter (Signed)
Patient calling the office for samples of medication:   1.  What medication and dosage are you requesting samples for? rivaroxaban (XARELTO) 20 MG TABS tablet   2.  Are you currently out of this medication? Yes  Needs today and can not afford to pick up from pharmacy. She states that it would be $150. Requesting call back.

## 2023-03-29 NOTE — Telephone Encounter (Signed)
Pt has Nurse, learning disability, should be using copay card. Tried to sign her up for one, received message that she already has one.   Called pt, advised her to make sure the pharmacy is running rx through her insurance and copay card. If there are still issues, she'll need to call in to copay card line at [phone]. She was appreciative for the call.

## 2023-03-29 NOTE — Telephone Encounter (Signed)
Duplicate message. 

## 2023-03-29 NOTE — Telephone Encounter (Signed)
Pt en route,staff spoke with her

## 2023-03-29 NOTE — Addendum Note (Signed)
Addended by: Marlyn Corporal A on: 03/29/2023 04:57 PM   Modules accepted: Orders

## 2023-03-29 NOTE — Telephone Encounter (Signed)
Pt is calling to f/u on receiving a callback today since tomorrow is a holiday and she has one pill left today. Please advise

## 2023-03-29 NOTE — Telephone Encounter (Signed)
Pt c/o medication issue:  1. Name of Medication:   rivaroxaban (XARELTO) 20 MG TABS tablet    2. How are you currently taking this medication (dosage and times per day)?  Take 1 tablet (20 mg total) by mouth daily with supper.       3. Are you having a reaction (difficulty breathing--STAT)? No  4. What is your medication issue? Pt stated she just tried to pick up her medication and now its $150.00 instead of the $10.00 she was paying before. She's requesting a callback to discuss options. Please advise

## 2023-03-31 ENCOUNTER — Encounter: Payer: Self-pay | Admitting: Physical Medicine & Rehabilitation

## 2023-03-31 ENCOUNTER — Encounter: Payer: 59 | Attending: Physical Medicine & Rehabilitation | Admitting: Physical Medicine & Rehabilitation

## 2023-03-31 VITALS — BP 107/70 | HR 100 | Ht 60.0 in | Wt 130.0 lb

## 2023-03-31 DIAGNOSIS — M1712 Unilateral primary osteoarthritis, left knee: Secondary | ICD-10-CM | POA: Insufficient documentation

## 2023-03-31 DIAGNOSIS — G8929 Other chronic pain: Secondary | ICD-10-CM | POA: Insufficient documentation

## 2023-03-31 MED ORDER — IOHEXOL 180 MG/ML  SOLN
5.0000 mL | Freq: Once | INTRAMUSCULAR | Status: AC
  Administered 2023-03-31: 5 mL

## 2023-03-31 MED ORDER — LIDOCAINE HCL 1 % IJ SOLN
10.00 mL | Freq: Once | INTRAMUSCULAR | Status: AC
Start: 2023-03-31 — End: 2023-03-31
  Administered 2023-03-31: 10 mL

## 2023-03-31 MED ORDER — BUPIVACAINE HCL (PF) 0.5 % IJ SOLN
5.0000 mL | Freq: Once | INTRAMUSCULAR | Status: AC
Start: 2023-03-31 — End: 2023-03-31
  Administered 2023-03-31: 5 mL

## 2023-03-31 NOTE — Patient Instructions (Signed)
Please call Monday to report % relief of pain

## 2023-03-31 NOTE — Progress Notes (Signed)
genicular nerve blocks under fluoroscopic guidance  Indication Chronic severe post operative knee pain that has not responded to PT, medication, and other conservative care.  Informed consent was obtained after discussing risks and benefits of procedure with the patient.  These include bleeding, bruising and infection as well as foot numbness. Pt placed in supine position on the fluoro table .  Static images identified distal femur and proximal tibia.  Lateral and medial supracondylar , as well as medial tibial flare prepped with betadine.  Marked under fluoro and then a 25 g 1.5 inch needle was used to anesthetize skin and subcutaneous tissue. 1.5 cc was infiltrate into each of 3 sites. Then a 22-gauge 3.5 inch needle was inserted under fluoroscopic guidance first targeting the medial tibial flare midpoint. After bone contact was made lateral images confirmed proper positioning and Isovue 200 times one ML was injected. This showed no evidence of intravascular uptake. Then 1.5 ML of the solution containing 5 ML of .25%marcaincaine was injected. This same procedure was treated for the lateral and medial supracondylar areas. Patient tolerated procedure well. Post procedure instructions given.

## 2023-04-03 ENCOUNTER — Telehealth: Payer: Self-pay | Admitting: Cardiology

## 2023-04-03 NOTE — Telephone Encounter (Signed)
Returned call to pt. No answer left msg to call back.  

## 2023-04-03 NOTE — Telephone Encounter (Signed)
  Pt c/o medication issue:  1. Name of Medication:   rivaroxaban (XARELTO) 20 MG TABS tablet   2. How are you currently taking this medication (dosage and times per day)?   As directed  3. Are you having a reaction (difficulty breathing--STAT)?  no  4. What is your medication issue?    Patient states that she cannot afford to take the Xarelto anymore because it is too expensive. Please advise.

## 2023-04-04 NOTE — Telephone Encounter (Signed)
Spoke with pt who states that she can not afford her Xarelto at $110 a month. Pt offered patient assistance forms for Xarelto. Pt requested that they be mailed to her home and she will complete.

## 2023-04-06 ENCOUNTER — Telehealth: Payer: Self-pay | Admitting: Cardiology

## 2023-04-06 NOTE — Telephone Encounter (Signed)
Patient calling the office for samples of medication:   1.  What medication and dosage are you requesting samples for? rivaroxaban (XARELTO) 20 MG TABS tablet   2.  Are you currently out of this medication?  Patient has 6 pills left.

## 2023-04-07 MED ORDER — RIVAROXABAN 20 MG PO TABS: 20.0000 mg | ORAL_TABLET | Freq: Every day | ORAL | 0 refills | Status: DC

## 2023-04-07 NOTE — Telephone Encounter (Signed)
[name] will look for patient assistance forms to come today or Saturday. If she gets them, she will come Monday, 7/15 and drop off along with picking up samples.

## 2023-04-10 ENCOUNTER — Telehealth: Payer: Self-pay | Admitting: *Deleted

## 2023-04-10 MED ORDER — RIVAROXABAN 20 MG PO TABS: 20.0000 mg | ORAL_TABLET | Freq: Every day | ORAL | Status: DC

## 2023-04-10 MED ORDER — RIVAROXABAN 20 MG PO TABS: 20.0000 mg | ORAL_TABLET | Freq: Every day | ORAL | 0 refills | Status: DC

## 2023-04-10 NOTE — Addendum Note (Signed)
Addended by: Kerney Elbe on: 04/10/2023 03:40 PM   Modules accepted: Orders

## 2023-04-10 NOTE — Telephone Encounter (Signed)
Sample request for Xarelto received.  Indication: PAF Last office visit: 03/22/23  Carleene Mains MD Weight: 58.6kg Age: 62 Scr: 0.90 on 01/13/23  Epic CrCl: 60.73  Based on above findings Xarelto 20mg  daily is the appropriate dose.  OK to provide samples if available.

## 2023-04-10 NOTE — Telephone Encounter (Signed)
Pt in office requesting Xarelto 20 mg samples. Pt given 14 tablets

## 2023-04-13 ENCOUNTER — Encounter: Payer: Self-pay | Admitting: Family Medicine

## 2023-04-13 ENCOUNTER — Ambulatory Visit: Payer: 59 | Admitting: Family Medicine

## 2023-04-13 VITALS — BP 138/80 | HR 96 | Temp 97.9°F | Ht 60.0 in | Wt 131.2 lb

## 2023-04-13 DIAGNOSIS — I214 Non-ST elevation (NSTEMI) myocardial infarction: Secondary | ICD-10-CM

## 2023-04-13 DIAGNOSIS — I714 Abdominal aortic aneurysm, without rupture, unspecified: Secondary | ICD-10-CM | POA: Diagnosis not present

## 2023-04-13 DIAGNOSIS — I429 Cardiomyopathy, unspecified: Secondary | ICD-10-CM

## 2023-04-13 DIAGNOSIS — M25561 Pain in right knee: Secondary | ICD-10-CM | POA: Diagnosis not present

## 2023-04-13 DIAGNOSIS — Z7901 Long term (current) use of anticoagulants: Secondary | ICD-10-CM

## 2023-04-13 DIAGNOSIS — Z8673 Personal history of transient ischemic attack (TIA), and cerebral infarction without residual deficits: Secondary | ICD-10-CM

## 2023-04-13 DIAGNOSIS — G8110 Spastic hemiplegia affecting unspecified side: Secondary | ICD-10-CM

## 2023-04-13 DIAGNOSIS — M8580 Other specified disorders of bone density and structure, unspecified site: Secondary | ICD-10-CM | POA: Insufficient documentation

## 2023-04-13 DIAGNOSIS — R269 Unspecified abnormalities of gait and mobility: Secondary | ICD-10-CM | POA: Diagnosis not present

## 2023-04-13 DIAGNOSIS — G8929 Other chronic pain: Secondary | ICD-10-CM

## 2023-04-13 DIAGNOSIS — I5032 Chronic diastolic (congestive) heart failure: Secondary | ICD-10-CM | POA: Diagnosis not present

## 2023-04-13 DIAGNOSIS — T148XXA Other injury of unspecified body region, initial encounter: Secondary | ICD-10-CM | POA: Diagnosis not present

## 2023-04-13 DIAGNOSIS — M8589 Other specified disorders of bone density and structure, multiple sites: Secondary | ICD-10-CM

## 2023-04-13 DIAGNOSIS — M170 Bilateral primary osteoarthritis of knee: Secondary | ICD-10-CM

## 2023-04-13 DIAGNOSIS — M25562 Pain in left knee: Secondary | ICD-10-CM

## 2023-04-13 MED ORDER — TRAMADOL HCL 50 MG PO TABS: 100.0000 mg | ORAL_TABLET | Freq: Three times a day (TID) | ORAL | 5 refills | Status: DC
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
## 2023-04-13 NOTE — Patient Instructions (Addendum)
Return in about 24 weeks (around 09/28/2023) for pain management.        Great to see you today.  I have refilled the medication(s) we provide.   If labs were collected, we will inform you of lab results once received either by echart message or telephone call.   - echart message- for normal results that have been seen by the patient already.   - telephone call: abnormal results or if patient has not viewed results in their echart.'

## 2023-04-15 LAB — DRUG MONITORING PANEL 376104, URINE
Amphetamines: NEGATIVE ng/mL (ref <500)
Barbiturates: NEGATIVE ng/mL (ref <300)
Benzodiazepines: NEGATIVE ng/mL (ref <100)
Cocaine Metabolite: NEGATIVE ng/mL (ref <150)
Desmethyltramadol: 2851 ng/mL — ABNORMAL HIGH (ref <100)
Opiates: NEGATIVE ng/mL (ref <100)
Oxycodone: NEGATIVE ng/mL (ref <100)
Tramadol: 8149 ng/mL — ABNORMAL HIGH (ref <100)

## 2023-04-15 LAB — DM TEMPLATE

## 2023-04-18 ENCOUNTER — Ambulatory Visit: Payer: 59 | Admitting: Plastic Surgery

## 2023-04-18 ENCOUNTER — Encounter: Payer: Self-pay | Admitting: Plastic Surgery

## 2023-04-18 VITALS — BP 122/60 | HR 104

## 2023-04-18 DIAGNOSIS — T148XXA Other injury of unspecified body region, initial encounter: Secondary | ICD-10-CM

## 2023-04-18 DIAGNOSIS — R220 Localized swelling, mass and lump, head: Secondary | ICD-10-CM | POA: Diagnosis not present
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
## 2023-04-25 DIAGNOSIS — I493 Ventricular premature depolarization: Secondary | ICD-10-CM | POA: Diagnosis not present

## 2023-04-26 ENCOUNTER — Telehealth: Payer: Self-pay | Admitting: *Deleted

## 2023-04-26 NOTE — Telephone Encounter (Signed)
-----   Message from [facility] sent at 04/25/2023  1:24 PM EDT ----- Elevated PVC burden at 14%.  Potentially the cause of her reduced ejection fraction.  Start mexiletine 250 mg twice daily

## 2023-04-26 NOTE — Telephone Encounter (Signed)
Left message to call back  

## 2023-06-09 NOTE — Telephone Encounter (Signed)
Left message to call back  

## 2023-06-16 ENCOUNTER — Other Ambulatory Visit: Payer: Self-pay | Admitting: Family Medicine

## 2023-06-21 ENCOUNTER — Other Ambulatory Visit: Payer: Self-pay | Admitting: Family Medicine

## 2023-06-23 ENCOUNTER — Telehealth: Payer: Self-pay | Admitting: Family Medicine

## 2023-06-23 MED ORDER — LEVOCETIRIZINE DIHYDROCHLORIDE 5 MG PO TABS: 5.0000 mg | ORAL_TABLET | Freq: Every evening | ORAL | 3 refills | Status: DC

## 2023-06-23 NOTE — Telephone Encounter (Signed)
Prescription Request  06/23/2023  LOV: 04/13/2023  What is the name of the medication or equipment? levocetirizine (XYZAL) 5 MG tablet   Have you contacted your pharmacy to request a refill? Yes   Which pharmacy would you like this sent to?  [facility] [address], [place] - [address] [place] [address] Phone: [phone] Fax: [phone]    Patient notified that their request is being sent to the clinical staff for review and that they should receive a response within 2 business days.   Please advise at Mobile [phone] (mobile)

## 2023-06-29 ENCOUNTER — Ambulatory Visit: Payer: 59 | Admitting: Plastic Surgery

## 2023-06-29 DIAGNOSIS — S0083XD Contusion of other part of head, subsequent encounter: Secondary | ICD-10-CM

## 2023-06-29 DIAGNOSIS — T148XXA Other injury of unspecified body region, initial encounter: Secondary | ICD-10-CM

## 2023-06-29 NOTE — Progress Notes (Signed)
[name] returns today for evaluation of the organized hematoma in the left portion of her forehead.  This is not changed appreciably since I saw her last time.  She has continued to massage the area.  I described to her what I would do for removal of the mass.  She understands that because of her anticoagulation even if she is to stop the Xarelto for 3 days that there is a higher risk of recurrence of the bleeding because of her chronic use of antiplatelet agents.  At this time she is elected not to have the the hematoma removed.  We did discuss at length that she can change her mind at any point in time.  I still would recommend doing this in the operating room due to her comorbidities and need for additional better lighting.  Return as needed.

## 2023-07-07 ENCOUNTER — Encounter: Payer: Self-pay | Admitting: *Deleted

## 2023-07-07 NOTE — Telephone Encounter (Signed)
Left message to call back  (Will mail letter asking pt to call the office to discuss)

## 2023-07-17 ENCOUNTER — Encounter: Payer: Self-pay | Admitting: Urgent Care

## 2023-07-17 ENCOUNTER — Ambulatory Visit (INDEPENDENT_AMBULATORY_CARE_PROVIDER_SITE_OTHER): Payer: 59 | Admitting: Urgent Care

## 2023-07-17 VITALS — BP 120/74 | HR 118 | Wt 126.4 lb

## 2023-07-17 DIAGNOSIS — B370 Candidal stomatitis: Secondary | ICD-10-CM | POA: Diagnosis not present

## 2023-07-17 MED ORDER — NYSTATIN 100000 UNIT/ML MT SUSP: 5.0000 mL | Freq: Four times a day (QID) | OROMUCOSAL | 0 refills | Status: AC

## 2023-07-17 NOTE — Progress Notes (Signed)
Established Patient Office Visit  Subjective:  Patient ID: [name], female    DOB: October 27, 1960  Age: 62 y.o. MRN: [id number]  Chief Complaint  Patient presents with   Acute Visit    She states she has been dealing with thrush in her mouth and throat since Friday.    Pleasant 62yo female presents today with c/o thrush in her mouth since Friday. She has had thrush before and is familiar with the symptoms. States she has white coating on her tongue, roof of mouth, and posterior pharynx. Mouth is dry and it is uncomfortable to swallow. She feels like it is swollen in her neck as well, primarily on the right side. She denies dysphagia, fever or swollen tonsils. No cough, SOB, CP or palpitations. She does smoke. Denies any recent dietary changes, but has tried an herbal mouth rinse made from her friends herbal shop. She feels this has helped minimally with current symptoms.     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   Bilateral chronic knee pain 08/17/2021   CHF with cardiomyopathy (HCC) 07/24/2020   High serum carbohydrate antigen 19-9 (CA19-9) 04/09/2020   Idiopathic recurrent acute pancreatitis 01/25/2020   Pancreatic cyst 09/11/2019   AAA (abdominal aortic aneurysm) (HCC), infrarenal - ulcerative plaque (3.1cm)- 09/10/2019 09/11/2019   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   Carpal tunnel syndrome of left wrist 03/15/2019   Spastic hemiparesis affecting dominant side (HCC) 09/20/2018   Primary osteoarthritis of both knees 07/04/2018   Iron deficiency 04/23/2018   Atherosclerosis of aorta (HCC) 02/02/2018   Chronic diastolic CHF (congestive heart failure) (HCC) 12/16/2017   S/P CABG  x 2 11/23/2017   Coronary atherosclerosis of autologous artery bypass graft without angina 11/23/2017   NSTEMI (non-ST elevated myocardial infarction) (HCC) 11/16/2017   History of ischemic middle cerebral artery stroke 08/28/2017   Hyperlipidemia LDL goal <70       ROS: as noted in HPI  Objective:     BP 120/74   Pulse (!) 118   Wt 126 lb 6.4 oz (57.3 kg)   LMP  (LMP Unknown) Comment: at age 59  SpO2 98%   BMI 24.69 kg/m    Physical Exam Vitals and nursing note reviewed.  Constitutional:      General: She is not in acute distress.    Appearance: Normal appearance. She is not ill-appearing, toxic-appearing or diaphoretic.  HENT:     Head: Normocephalic and atraumatic.     Nose: Nose normal.     Mouth/Throat:     Lips: Pink.     Mouth: Mucous membranes are dry. Oral lesions present. No injury.     Dentition: Abnormal dentition. No gingival swelling or dental abscesses.     Tongue: Lesions (thrush) present.     Pharynx: Uvula midline. Oropharyngeal exudate (thrush as annotated) and posterior oropharyngeal erythema present. No uvula swelling.     Tonsils: No tonsillar exudate or tonsillar abscesses.   Cardiovascular:     Rate and Rhythm: Normal rate and regular rhythm.  Pulmonary:     Effort: Pulmonary effort is normal. No respiratory distress.     Breath sounds: Normal breath sounds.  Musculoskeletal:     Cervical back: Normal range of motion and neck  supple. No rigidity or tenderness.  Lymphadenopathy:     Cervical: No cervical adenopathy.  Skin:    General: Skin is warm and dry.     Coloration: Skin is not jaundiced.  Neurological:     General: No focal deficit present.     Mental Status: She is alert and oriented to person, place, and time.      No results found for any visits on 07/17/23.    The ASCVD Risk score (Arnett DK, et al., 2019) failed to calculate for the following reasons:   The patient has a prior MI or stroke diagnosis  Assessment & Plan:   Thrush, oral -     Nystatin; Take 5 mLs (500,000 Units total) by mouth 4 (four) times daily for 14 days. 5 ml swish for 3-5 minutes, may swallow or spit.  Dispense: 280 mL; Refill: 0  Your symptoms are consistent with thrush.  Swish 5mL of nystatin in your mouth for 3-5 minutes. Do this 4 times daily. If you feel like the thrush is going down your esophagus, you may swallow the nystatin. If you do not need to swallow, spitting out is okay.  Do this for 10-14 days. If your symptoms do not appear to be improving within 5-6 days, please notify our office. (Would consider Oravig or clotrimazole troche if needed).  Try to cut back/ stop smoking as this can worsen this condition. Avoid excessive dairy products and sugary foods/ drinks.  Eating anti-fungal foods like garlic and beans may be beneficial.   No follow-ups on file.   Maretta Bees, PA

## 2023-07-17 NOTE — Patient Instructions (Addendum)
Your symptoms are consistent with thrush. Please read the attached handout.  Swish 5mL of nystatin in your mouth for 3-5 minutes. Do this 4 times daily. If you feel like the thrush is going down your esophagus, you may swallow the nystatin. If you do not need to swallow, spitting out is okay.  Do this for 10-14 days. If your symptoms do not appear to be improving within 5-6 days, please notify our office.  Try to cut back/ stop smoking as this can worsen this condition. Avoid excessive dairy products and sugary foods/ drinks.  Eating anti-fungal foods like garlic and beans may be beneficial.   Keep your routine follow up in January as scheduled. Return sooner as needed.

## 2023-07-21 ENCOUNTER — Ambulatory Visit
Admission: RE | Admit: 2023-07-21 | Discharge: 2023-07-21 | Disposition: A | Discharge status: Home / Self Care | Payer: 59 | Source: Ambulatory Visit | Attending: Family Medicine | Admitting: Family Medicine

## 2023-07-21 DIAGNOSIS — R921 Mammographic calcification found on diagnostic imaging of breast: Secondary | ICD-10-CM | POA: Diagnosis not present

## 2023-07-24 ENCOUNTER — Telehealth: Payer: Self-pay | Admitting: Cardiology

## 2023-07-24 MED ORDER — RIVAROXABAN 20 MG PO TABS: 20.0000 mg | ORAL_TABLET | Freq: Every day | ORAL | 0 refills | Status: DC

## 2023-07-24 NOTE — Telephone Encounter (Signed)
Patient calling the office for samples of medication:   1.  What medication and dosage are you requesting samples for? rivaroxaban (XARELTO) 20 MG TABS tablet   2.  Are you currently out of this medication? No   Has two tablets left.

## 2023-07-24 NOTE — Telephone Encounter (Signed)
Spoke with pt who states that her cost for Xarelto has gone up to over $200 for 3 months. Pt states that she is unwilling to talk with her insurance company or [employer]. Informed pt that we can provide samples if she is willing to fill out patient assistance forms. Pt agreed and ask that both are placed at front desk for pick up.

## 2023-08-09 ENCOUNTER — Telehealth: Payer: Self-pay

## 2023-08-09 NOTE — Telephone Encounter (Signed)
Prescription Request  08/09/2023  LOV: Visit date not found  What is the name of the medication or equipment?  nystatin (MYCOSTATIN) 100000 UNIT/ML suspension  Have you contacted your pharmacy to request a refill? No   Which pharmacy would you like this sent to?  [facility] [address], [place] - [address] [place] [address] Phone: [phone] Fax: [phone]    Patient notified that their request is being sent to the clinical staff for review and that they should receive a response within 2 business days.   Please advise at Mobile [phone] (mobile)

## 2023-08-09 NOTE — Telephone Encounter (Signed)
Pt will need an OV if she still has symptoms the previous prescription has expired

## 2023-08-16 ENCOUNTER — Other Ambulatory Visit: Payer: Self-pay | Admitting: Urgent Care

## 2023-08-16 DIAGNOSIS — B370 Candidal stomatitis: Secondary | ICD-10-CM

## 2023-08-16 MED ORDER — CLOTRIMAZOLE 10 MG MT TROC: 10.0000 mg | Freq: Every day | OROMUCOSAL | 0 refills | Status: AC

## 2023-08-16 NOTE — Progress Notes (Signed)
Will change rx from nystatin swish and swallow to clotrimazole troche

## 2023-08-16 NOTE — Telephone Encounter (Signed)
Patient called this morning in regards to the medication refill request on 11/13. She states that she was never notified about the prescription being expired and that she would need an appointment. After speaking with the CMA I was informed patient was aware that she would need a follow up  appointment if her symptoms continued during the day of her appointment. Patient continued to say she is getting to the point of not wanting to come here anymore because she always needs an appointment for everything. [name] continued to state she is getting lots of dental work done and she is already feeling like Ginette Pitman is developing. I suggested to the patient that since this may be related to her dental work she could inquire with her dentist for a prescription as well if she did not want to come in for an appointment.

## 2023-08-16 NOTE — Telephone Encounter (Signed)
FYI. Pt was seen on 10/21 and was told to use Nystatin for 10-14 days and if symptoms do not appear to be improving in 5-6 days to notify us. Pt called on 11/13 for refill and a message was sent back to front office advising that pt would need an appt because prescription was expired.

## 2023-08-16 NOTE — Telephone Encounter (Signed)
I remember this patient vividly. She actually had a pretty bad case of it. I dont think she has to come back for recheck. But I also wouldn't advise refilling the same medication - I will call in a clotrimazole troche. This is something she will stick to the gums of her mouth several times daily. If she does not get a good response to this treatment, then she will need to be seen in office. I have called the medication in.

## 2023-08-16 NOTE — Telephone Encounter (Signed)
Pt advised of recommendations and new meds

## 2023-09-13 ENCOUNTER — Ambulatory Visit (HOSPITAL_BASED_OUTPATIENT_CLINIC_OR_DEPARTMENT_OTHER): Payer: 59 | Admitting: Certified Nurse Midwife

## 2023-09-13 ENCOUNTER — Encounter (HOSPITAL_BASED_OUTPATIENT_CLINIC_OR_DEPARTMENT_OTHER): Payer: Self-pay | Admitting: Certified Nurse Midwife

## 2023-09-13 VITALS — BP 126/88 | HR 85 | Ht 60.0 in | Wt 128.6 lb

## 2023-09-13 DIAGNOSIS — N950 Postmenopausal bleeding: Secondary | ICD-10-CM
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
## 2023-09-25 ENCOUNTER — Telehealth: Payer: Self-pay | Admitting: Gastroenterology

## 2023-09-25 NOTE — Telephone Encounter (Signed)
Inbound call from patient, states she would like to speak with a nurse in regards to a recent pancreatitis episode. Patient was scheduled for January 8th at 11:30 AM but would like to speak with someone sooner.

## 2023-09-28 NOTE — Telephone Encounter (Signed)
 Message left for pt to return call if she still has questions.

## 2023-09-28 NOTE — Telephone Encounter (Signed)
 Left message on machine to call back

## 2023-10-03 ENCOUNTER — Telehealth: Payer: Self-pay

## 2023-10-03 ENCOUNTER — Encounter: Payer: Self-pay | Admitting: Family Medicine

## 2023-10-03 ENCOUNTER — Telehealth: Payer: Self-pay | Admitting: Family Medicine

## 2023-10-03 ENCOUNTER — Ambulatory Visit: Payer: No Typology Code available for payment source | Admitting: Family Medicine

## 2023-10-03 VITALS — BP 122/76 | HR 99 | Temp 97.7°F | Wt 128.6 lb

## 2023-10-03 DIAGNOSIS — E785 Hyperlipidemia, unspecified: Secondary | ICD-10-CM | POA: Diagnosis not present

## 2023-10-03 DIAGNOSIS — R8290 Unspecified abnormal findings in urine: Secondary | ICD-10-CM

## 2023-10-03 DIAGNOSIS — E611 Iron deficiency: Secondary | ICD-10-CM

## 2023-10-03 DIAGNOSIS — Z7901 Long term (current) use of anticoagulants: Secondary | ICD-10-CM | POA: Diagnosis not present

## 2023-10-03 DIAGNOSIS — D509 Iron deficiency anemia, unspecified: Secondary | ICD-10-CM | POA: Diagnosis not present

## 2023-10-03 DIAGNOSIS — Z23 Encounter for immunization: Secondary | ICD-10-CM | POA: Diagnosis not present

## 2023-10-03 DIAGNOSIS — G8929 Other chronic pain: Secondary | ICD-10-CM

## 2023-10-03 DIAGNOSIS — M25561 Pain in right knee: Secondary | ICD-10-CM

## 2023-10-03 DIAGNOSIS — M170 Bilateral primary osteoarthritis of knee: Secondary | ICD-10-CM

## 2023-10-03 DIAGNOSIS — M25562 Pain in left knee: Secondary | ICD-10-CM

## 2023-10-03 LAB — COMPREHENSIVE METABOLIC PANEL
ALT: 7 U/L (ref 0–35)
AST: 17 U/L (ref 0–37)
Albumin: 4.1 g/dL (ref 3.5–5.2)
Alkaline Phosphatase: 90 U/L (ref 39–117)
BUN: 8 mg/dL (ref 6–23)
CO2: 27 meq/L (ref 19–32)
Calcium: 9.3 mg/dL (ref 8.4–10.5)
Chloride: 103 meq/L (ref 96–112)
Creatinine, Ser: 0.73 mg/dL (ref 0.40–1.20)
GFR: 87.51 mL/min (ref >60.00)
Glucose, Bld: 99 mg/dL (ref 70–99)
Potassium: 4.7 meq/L (ref 3.5–5.1)
Sodium: 138 meq/L (ref 135–145)
Total Bilirubin: 0.2 mg/dL (ref 0.2–1.2)
Total Protein: 6.9 g/dL (ref 6.0–8.3)

## 2023-10-03 LAB — IBC + FERRITIN
Ferritin: 5.3 ng/mL — ABNORMAL LOW (ref 10.0–291.0)
Iron: 15 ug/dL — ABNORMAL LOW (ref 42–145)
Saturation Ratios: 3.5 % — ABNORMAL LOW (ref 20.0–50.0)
TIBC: 428.4 ug/dL (ref 250.0–450.0)
Transferrin: 306 mg/dL (ref 212.0–360.0)

## 2023-10-03 LAB — LIPID PANEL
Cholesterol: 182 mg/dL (ref 0–200)
HDL: 31.9 mg/dL — ABNORMAL LOW (ref >39.00)
LDL Cholesterol: 96 mg/dL (ref 0–99)
NonHDL: 149.9
Total CHOL/HDL Ratio: 6
Triglycerides: 270 mg/dL — ABNORMAL HIGH (ref 0.0–149.0)
VLDL: 54 mg/dL — ABNORMAL HIGH (ref 0.0–40.0)

## 2023-10-03 LAB — CBC
HCT: 24.7 % — ABNORMAL LOW (ref 36.0–46.0)
Hemoglobin: 7.2 g/dL — CL (ref 12.0–15.0)
MCHC: 29.1 g/dL — ABNORMAL LOW (ref 30.0–36.0)
MCV: 66.8 fL — ABNORMAL LOW (ref 78.0–100.0)
Platelets: 603 10*3/uL — ABNORMAL HIGH (ref 150.0–400.0)
RBC: 3.69 Mil/uL — ABNORMAL LOW (ref 3.87–5.11)
RDW: 19.2 % — ABNORMAL HIGH (ref 11.5–15.5)
WBC: 10.1 10*3/uL (ref 4.0–10.5)

## 2023-10-03 LAB — POC URINALSYSI DIPSTICK (AUTOMATED)
Bilirubin, UA: NEGATIVE
Blood, UA: NEGATIVE
Glucose, UA: NEGATIVE
Ketones, UA: NEGATIVE
Nitrite, UA: NEGATIVE
Protein, UA: POSITIVE — AB
Spec Grav, UA: 1.015 (ref 1.010–1.025)
Urobilinogen, UA: 0.2 U/dL
pH, UA: 6 (ref 5.0–8.0)

## 2023-10-03 MED ORDER — LEVOCETIRIZINE DIHYDROCHLORIDE 5 MG PO TABS: 5.0000 mg | ORAL_TABLET | Freq: Every evening | ORAL | 3 refills | Status: DC

## 2023-10-03 MED ORDER — HYDROCODONE-ACETAMINOPHEN 5-325 MG PO TABS: 0.5000 | ORAL_TABLET | Freq: Two times a day (BID) | ORAL | 0 refills | Status: DC

## 2023-10-03 NOTE — Patient Instructions (Signed)
 Return in about 4 weeks (around 10/31/2023).        Great to see you today.  I have refilled the medication(s) we provide.   If labs were collected or images ordered, we will inform you of  results once we have received them and reviewed. We will contact you either by echart message, or telephone call.  Please give ample time to the testing facility, and our office to run,  receive and review results. Please do not call inquiring of results, even if you can see them in your chart. We will contact you as soon as we are able. If it has been over 1 week since the test was completed, and you have not yet heard from us , then please call us .    - echart message- for normal results that have been seen by the patient already.   - telephone call: abnormal results or if patient has not viewed results in their echart.  If a referral to a specialist was entered for you, please call us  in 2 weeks if you have not heard from the specialist office to schedule.

## 2023-10-03 NOTE — Telephone Encounter (Signed)
 On call provider (Dr. Darrick Huntsman) made aware

## 2023-10-03 NOTE — Telephone Encounter (Signed)
 CRITICAL VALUE STICKER  CRITICAL VALUE: hemoglobin 7.2  RECEIVER (on-site recipient of call): Erie Noe  DATE & TIME NOTIFIED: 1625  MESSENGER (representative from lab): Clydie Braun  MD NOTIFIED: Darrick Huntsman  TIME OF NOTIFICATION: 1648  RESPONSE:
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
## 2023-10-03 NOTE — Telephone Encounter (Signed)
 Copied from CRM [id number]. Topic: Clinical - Lab/Test Results >> Oct 03, 2023  4:20 PM [place] D wrote: Reason for CRM: Clydie Braun from [employer] has critical results for patient

## 2023-10-03 NOTE — Telephone Encounter (Signed)
 Pt has been notified and will tell husband once he gets home. Pt is hesitant about going to ED

## 2023-10-04 ENCOUNTER — Ambulatory Visit (INDEPENDENT_AMBULATORY_CARE_PROVIDER_SITE_OTHER)
Admission: RE | Admit: 2023-10-04 | Discharge: 2023-10-04 | Disposition: A | Discharge status: Home / Self Care | Payer: No Typology Code available for payment source | Source: Ambulatory Visit | Attending: Gastroenterology | Admitting: Gastroenterology

## 2023-10-04 ENCOUNTER — Other Ambulatory Visit: Payer: Self-pay

## 2023-10-04 ENCOUNTER — Encounter (HOSPITAL_COMMUNITY): Payer: Self-pay

## 2023-10-04 ENCOUNTER — Other Ambulatory Visit (INDEPENDENT_AMBULATORY_CARE_PROVIDER_SITE_OTHER): Payer: No Typology Code available for payment source

## 2023-10-04 ENCOUNTER — Emergency Department (HOSPITAL_COMMUNITY)
Admission: EM | Admit: 2023-10-04 | Discharge: 2023-10-04 | Disposition: A | Discharge status: Home / Self Care | Payer: No Typology Code available for payment source | Attending: Emergency Medicine | Admitting: Emergency Medicine

## 2023-10-04 ENCOUNTER — Ambulatory Visit (INDEPENDENT_AMBULATORY_CARE_PROVIDER_SITE_OTHER): Payer: No Typology Code available for payment source | Admitting: Gastroenterology

## 2023-10-04 VITALS — BP 108/62 | HR 119 | Ht 60.0 in | Wt 129.5 lb

## 2023-10-04 DIAGNOSIS — Z8673 Personal history of transient ischemic attack (TIA), and cerebral infarction without residual deficits: Secondary | ICD-10-CM | POA: Insufficient documentation

## 2023-10-04 DIAGNOSIS — Z9889 Other specified postprocedural states: Secondary | ICD-10-CM

## 2023-10-04 DIAGNOSIS — Z8719 Personal history of other diseases of the digestive system: Secondary | ICD-10-CM | POA: Diagnosis not present

## 2023-10-04 DIAGNOSIS — Z7901 Long term (current) use of anticoagulants: Secondary | ICD-10-CM | POA: Diagnosis not present

## 2023-10-04 DIAGNOSIS — D649 Anemia, unspecified: Secondary | ICD-10-CM | POA: Diagnosis present

## 2023-10-04 DIAGNOSIS — Z951 Presence of aortocoronary bypass graft: Secondary | ICD-10-CM | POA: Insufficient documentation

## 2023-10-04 DIAGNOSIS — I2510 Atherosclerotic heart disease of native coronary artery without angina pectoris: Secondary | ICD-10-CM | POA: Diagnosis not present

## 2023-10-04 DIAGNOSIS — R197 Diarrhea, unspecified: Secondary | ICD-10-CM

## 2023-10-04 DIAGNOSIS — D500 Iron deficiency anemia secondary to blood loss (chronic): Secondary | ICD-10-CM | POA: Diagnosis not present

## 2023-10-04 DIAGNOSIS — D509 Iron deficiency anemia, unspecified: Secondary | ICD-10-CM

## 2023-10-04 DIAGNOSIS — K862 Cyst of pancreas: Secondary | ICD-10-CM

## 2023-10-04 LAB — COMPREHENSIVE METABOLIC PANEL
ALT: 10 U/L (ref 0–44)
AST: 20 U/L (ref 15–41)
Albumin: 3.6 g/dL (ref 3.5–5.0)
Alkaline Phosphatase: 79 U/L (ref 38–126)
Anion gap: 10 (ref 5–15)
BUN: 9 mg/dL (ref 8–23)
CO2: 22 mmol/L (ref 22–32)
Calcium: 8.7 mg/dL — ABNORMAL LOW (ref 8.9–10.3)
Chloride: 103 mmol/L (ref 98–111)
Creatinine, Ser: 0.75 mg/dL (ref 0.44–1.00)
GFR, Estimated: >60 mL/min (ref >60)
Glucose, Bld: 131 mg/dL — ABNORMAL HIGH (ref 70–99)
Potassium: 3.9 mmol/L (ref 3.5–5.1)
Sodium: 135 mmol/L (ref 135–145)
Total Bilirubin: 0.3 mg/dL (ref 0.0–1.2)
Total Protein: 7.2 g/dL (ref 6.5–8.1)

## 2023-10-04 LAB — FERRITIN: Ferritin: 5 ng/mL — ABNORMAL LOW (ref 11–307)

## 2023-10-04 LAB — RETICULOCYTES
ABS Retic: 72400 {cells}/uL (ref 20000–80000)
Immature Retic Fract: 35 % — ABNORMAL HIGH (ref 2.3–15.9)
RBC.: 3.29 MIL/uL — ABNORMAL LOW (ref 3.87–5.11)
Retic Count, Absolute: 73.4 10*3/uL (ref 19.0–186.0)
Retic Ct Pct: 2 %
Retic Ct Pct: 2.2 % (ref 0.4–3.1)

## 2023-10-04 LAB — CBC
HCT: 24 % — ABNORMAL LOW (ref 36.0–46.0)
Hemoglobin: 7 g/dL — CL (ref 12.0–15.0)
MCHC: 29.1 g/dL — ABNORMAL LOW (ref 30.0–36.0)
MCV: 66 fL — ABNORMAL LOW (ref 78.0–100.0)
Platelets: 566 10*3/uL — ABNORMAL HIGH (ref 150.0–400.0)
RBC: 3.63 Mil/uL — ABNORMAL LOW (ref 3.87–5.11)
RDW: 19.1 % — ABNORMAL HIGH (ref 11.5–15.5)
WBC: 9.6 10*3/uL (ref 4.0–10.5)

## 2023-10-04 LAB — FOLATE
Folate: 5.6 ng/mL — ABNORMAL LOW (ref >5.9)
Folate: 5.3 ng/mL — ABNORMAL LOW (ref >5.9)

## 2023-10-04 LAB — CBC WITH DIFFERENTIAL/PLATELET
Abs Immature Granulocytes: 0.03 10*3/uL (ref 0.00–0.07)
Basophils Absolute: 0.1 10*3/uL (ref 0.0–0.1)
Basophils Relative: 1 %
Eosinophils Absolute: 0.2 10*3/uL (ref 0.0–0.5)
Eosinophils Relative: 2 %
HCT: 23.6 % — ABNORMAL LOW (ref 36.0–46.0)
Hemoglobin: 6.5 g/dL — CL (ref 12.0–15.0)
Immature Granulocytes: 0 %
Lymphocytes Relative: 22 %
Lymphs Abs: 1.9 10*3/uL (ref 0.7–4.0)
MCH: 19.3 pg — ABNORMAL LOW (ref 26.0–34.0)
MCHC: 27.5 g/dL — ABNORMAL LOW (ref 30.0–36.0)
MCV: 70.2 fL — ABNORMAL LOW (ref 80.0–100.0)
Monocytes Absolute: 0.8 10*3/uL (ref 0.1–1.0)
Monocytes Relative: 10 %
Neutro Abs: 5.5 10*3/uL (ref 1.7–7.7)
Neutrophils Relative %: 65 %
Platelets: 485 10*3/uL — ABNORMAL HIGH (ref 150–400)
RBC: 3.36 MIL/uL — ABNORMAL LOW (ref 3.87–5.11)
RDW: 18.7 % — ABNORMAL HIGH (ref 11.5–15.5)
WBC: 8.5 10*3/uL (ref 4.0–10.5)
nRBC: 0.2 % (ref 0.0–0.2)

## 2023-10-04 LAB — POC OCCULT BLOOD, ED: Fecal Occult Bld: POSITIVE — AB

## 2023-10-04 LAB — IRON AND TIBC
Iron: 16 ug/dL — ABNORMAL LOW (ref 28–170)
Saturation Ratios: 5 % — ABNORMAL LOW (ref 10.4–31.8)
TIBC: 357 ug/dL (ref 250–450)
UIBC: 341 ug/dL

## 2023-10-04 LAB — PREPARE RBC (CROSSMATCH)

## 2023-10-04 LAB — VITAMIN B12
Vitamin B-12: 393 pg/mL (ref 211–911)
Vitamin B-12: 357 pg/mL (ref 180–914)

## 2023-10-04 MED ORDER — SODIUM CHLORIDE 0.9% IV SOLUTION: Freq: Once | INTRAVENOUS | Status: AC

## 2023-10-04 NOTE — Patient Instructions (Signed)
 Your provider has requested that you go to the basement level for lab work before leaving today. Press B on the elevator. The lab is located at the first door on the left as you exit the elevator.  You will need MRI/MRCP in 2-3 months. You will be contact by office at a later time to schedule.   If your HGB is greater than 8 we will place urgent referral Hematology for IV iron  infusion. If your HGB is less than 8 you will be referred to ED for blood transfusion.   Due to recent changes in healthcare laws, you may see the results of your imaging and laboratory studies on MyChart before your provider has had a chance to review them.  We understand that in some cases there may be results that are confusing or concerning to you. Not all laboratory results come back in the same time frame and the provider may be waiting for multiple results in order to interpret others.  Please give us  48 hours in order for your provider to thoroughly review all the results before contacting the office for clarification of your results.   _______________________________________________________  If your blood pressure at your visit was 140/90 or greater, please contact your primary care physician to follow up on this.  _______________________________________________________  If you are age 64 or older, your body mass index should be between 23-30. Your Body mass index is 25.29 kg/m. If this is out of the aforementioned range listed, please consider follow up with your Primary Care Provider.  If you are age 64 or younger, your body mass index should be between 19-25. Your Body mass index is 25.29 kg/m. If this is out of the aformentioned range listed, please consider follow up with your Primary Care Provider.   ________________________________________________________  The [facility] providers would like to encourage you to use MYCHART to communicate with providers for non-urgent requests or questions.  Due to long  hold times on the telephone, sending your provider a message by [facility] may be a faster and more efficient way to get a response.  Please allow 48 business hours for a response.  Please remember that this is for non-urgent requests.  _______________________________________________________  Thank you for choosing me and [facility].  Dr. Wilhelmenia

## 2023-10-04 NOTE — ED Triage Notes (Signed)
 Hemoglobin of 7 at PCP today, pt denies any sx. Denies bloody stools. Pt is on blood thinners.

## 2023-10-04 NOTE — ED Provider Triage Note (Signed)
 Emergency Medicine Provider Triage Evaluation Note  [name] , a 63 y.o. female  was evaluated in triage.  Pt was recommended to go to ED yesterday by PCP regarding low Hgb. Today recommended to go to ED for low Hgb obtained from gastro.  Complains of SOB with exertion. No CP  5 years ago when she had stroke she was recommended to have iron  supplementation  Denies dark, bloody stool, dizziness, blurred vision, HA, hx of transfusion  Review of Systems  Positive: Low hgb Negative: Denies dark, bloody stool, dizziness, blurred vision, HA  Physical Exam  BP 120/69 (BP Location: Left Arm)   Pulse (!) 118   Temp 98.1 F (36.7 C) (Oral)   Resp 17   LMP  (LMP Unknown) Comment: at age 37  SpO2 100%  Gen:   Awake, no distress   Resp:  Normal effort  MSK:   Moves extremities without difficulty  Other:    Medical Decision Making  Medically screening exam initiated at 2:50 PM.  Appropriate orders placed.  [name] was informed that the remainder of the evaluation will be completed by another provider, this initial triage assessment does not replace that evaluation, and the importance of remaining in the ED until their evaluation is complete.  Labs ordered   [name], [name] 10/04/23 1454

## 2023-10-04 NOTE — Telephone Encounter (Signed)
 FYI: Pt states that they are supposed to give her a iron  or blood transfusion but she has been there for 4 hours and not sure she is going to stay. She does not feel like it is that extreme that she stays at the ED because she has had this problem before and feels fine.

## 2023-10-04 NOTE — Telephone Encounter (Signed)
 Pt was receiving transfusion at the time of call.

## 2023-10-04 NOTE — ED Provider Notes (Signed)
 [place] EMERGENCY DEPARTMENT AT [facility] Provider Note   CSN: [id number] Arrival date & time: 10/04/23  1322     History  Chief Complaint  Patient presents with   Low Hemoglobin    [name] is a 63 y.o. female.  Patient is a 63 year old female with a history of CAD status post CABG, ischemic cardiomyopathy, atrial fibrillation on Xarelto , stroke, AAA which has been stable and being followed, gastritis and duodenitis, recurrent pancreatitis with cyst that is being followed by GI who is presenting today from her doctor's office due to new anemia of unknown cause.  Patient reports that she went to her doctor yesterday and they checked her labs and called her telling her her hemoglobin had gone from 10-7 since April and she needed to come to the hospital for a blood transfusion.  Patient reports that she has not particularly felt any different than normal except for some mild DOE.  Patient did denies any black or bloody looking stool.  She denies any NSAID use, heavy alcohol use, aspirin  use.  She reports on rare occasion she has had some minimal vaginal spotting that she is getting an ultrasound for this month but no heavy bleeding.  She did have a lot of diarrhea at the end of December that she felt was consistent with one of her pancreatic flares but it resolved after Kaopectate.  The history is provided by the patient.       Home Medications Prior to Admission medications   Medication Sig Start Date End Date Taking? Authorizing Provider  HYDROcodone -acetaminophen  (NORCO/VICODIN) 5-325 MG tablet Take 0.5 tablets by mouth in the morning and at bedtime. 10/03/23   [name], [name], DO  levocetirizine (XYZAL ) 5 MG tablet Take 1 tablet (5 mg total) by mouth every evening. 10/03/23   [name], [name], DO  pravastatin  (PRAVACHOL ) 40 MG tablet Take 1 tablet (40 mg total) by mouth every evening. 12/28/22 12/23/23  [name], MD  rivaroxaban  (XARELTO ) 20 MG TABS tablet  Take 1 tablet (20 mg total) by mouth daily with supper. 07/24/23   [name], MD      Allergies    Hydromorphone , Lopressor  [metoprolol  tartrate], Sacubitril -valsartan , Allegra  allergy [fexofenadine  hcl], Fexofenadine , Lasix  [furosemide ], Nsaids, Gabapentin , Gabapentin  (once-daily), and Lipitor  [atorvastatin ]    Review of Systems   Review of Systems  Physical Exam Updated Vital Signs BP (!) 166/87   Pulse (!) 109   Temp (!) 97.5 F (36.4 C) (Oral)   Resp 18   LMP  (LMP Unknown) Comment: at age 28  SpO2 98%  Physical Exam Vitals and nursing note reviewed.  Constitutional:      General: She is not in acute distress.    Appearance: She is well-developed.  HENT:     Head: Normocephalic and atraumatic.  Eyes:     Pupils: Pupils are equal, round, and reactive to light.  Cardiovascular:     Rate and Rhythm: Normal rate and regular rhythm.     Heart sounds: Normal heart sounds. No murmur heard.    No friction rub.  Pulmonary:     Effort: Pulmonary effort is normal.     Breath sounds: Normal breath sounds. No wheezing or rales.  Abdominal:     General: Bowel sounds are normal. There is no distension.     Palpations: Abdomen is soft.     Tenderness: There is no abdominal tenderness. There is no guarding or rebound.  Genitourinary:    Rectum:  Guaiac result positive.     Comments: Stool is brown and no hemorrhoids visible Musculoskeletal:        General: No tenderness. Normal range of motion.     Comments: No edema  Skin:    General: Skin is warm and dry.     Coloration: Skin is pale.     Findings: No rash.  Neurological:     Mental Status: She is alert and oriented to person, place, and time.     Cranial Nerves: No cranial nerve deficit.  Psychiatric:        Behavior: Behavior normal.     ED Results / Procedures / Treatments   Labs (all labs ordered are listed, but only abnormal results are displayed) Labs Reviewed  COMPREHENSIVE METABOLIC PANEL - Abnormal;  Notable for the following components:      Result Value   Glucose, Bld 131 (*)    Calcium  8.7 (*)    All other components within normal limits  CBC WITH DIFFERENTIAL/PLATELET - Abnormal; Notable for the following components:   RBC 3.36 (*)    Hemoglobin 6.5 (*)    HCT 23.6 (*)    MCV 70.2 (*)    MCH 19.3 (*)    MCHC 27.5 (*)    RDW 18.7 (*)    Platelets 485 (*)    All other components within normal limits  FOLATE - Abnormal; Notable for the following components:   Folate 5.3 (*)    All other components within normal limits  IRON  AND TIBC - Abnormal; Notable for the following components:   Iron  16 (*)    Saturation Ratios 5 (*)    All other components within normal limits  FERRITIN - Abnormal; Notable for the following components:   Ferritin 5 (*)    All other components within normal limits  RETICULOCYTES - Abnormal; Notable for the following components:   RBC. 3.29 (*)    Immature Retic Fract 35.0 (*)    All other components within normal limits  POC OCCULT BLOOD, ED - Abnormal; Notable for the following components:   Fecal Occult Bld POSITIVE (*)    All other components within normal limits  VITAMIN B12  TYPE AND SCREEN  PREPARE RBC (CROSSMATCH)    EKG EKG Interpretation Date/Time:  Wednesday October 04 2023 13:38:31 EST Ventricular Rate:  112 PR Interval:  130 QRS Duration:  116 QT Interval:  321 QTC Calculation: 439 R Axis:   69  Text Interpretation: Sinus tachycardia Nonspecific intraventricular conduction delay Borderline repolarization abnormality No significant change since last tracing Confirmed by Doretha Folks ([id number]) on 10/04/2023 5:04:19 PM  Radiology DG Abd 2 Views Result Date: 10/04/2023 CLINICAL DATA:  Pancreatic stent.  Pancreatitis. EXAM: ABDOMEN - 2 VIEW COMPARISON:  June 18, 2021.  January 13, 2023. FINDINGS: No abnormal bowel dilatation is noted. Pancreatic ductal stent noted on prior CT scan is not visualized currently. Surgical clips are  noted in right upper quadrant. IMPRESSION: Pancreatic ductal stent noted on prior CT scan is not visualized currently. Electronically Signed   By: Lynwood Landy Raddle M.D.   On: 10/04/2023 14:11    Procedures Procedures    Medications Ordered in ED Medications  0.9 %  sodium chloride  infusion (Manually program via Guardrails IV Fluids) (0 mLs Intravenous Stopped 10/04/23 1934)    ED Course/ Medical Decision Making/ A&P  Medical Decision Making Amount and/or Complexity of Data Reviewed Labs: ordered. Decision-making details documented in ED Course.  Risk Prescription drug management.   Pt with multiple medical problems and comorbidities and presenting today with a complaint that caries a high risk for morbidity and mortality.  Here today with new onset anemia of unknown timeframe.  Patient does take anticoagulation but is denying any black or bloody stools.  She has not noticed any obvious blood loss.  She went to PCP yesterday and hemoglobin came back at 7.  Saw her GI doctor today which was a scheduled visit they rechecked her hemoglobin it was still low and sent her here as they felt that she needed blood and then would follow-up with iron  transfusion and follow-up.  Patient had her last colonoscopy 3 years ago and had 6 polyps removed.  She denies any history of GI bleeding.  She has been on Xarelto  for approximately 5 years for A-fib.  Patient reports today that she wants to get blood but she does not want to stay in the hospital. I independently interpreted patient's labs and EKG and CMP without acute findings, CBC with a hemoglobin of 6.5 with normal white count and elevated platelet count.  Patient's Hemoccult is positive.  Anemia panel shows iron  deficiency and low folate.  EKG with sinus tachycardia but no evidence of dysrhythmia.  Patient was given 2 units.  She did consent to blood.  Patient denies any symptoms of endorgan damage.  Her only complaint is of  minimal shortness of breath on exertion.  10:28 PM Pt feeling well after infusion.  Wants to go home.  Discussed with her the positive hemoccult and need to f/u with GI.  She states this already in the works.  She was given return precautions and d/ced home.        Final Clinical Impression(s) / ED Diagnoses Final diagnoses:  Iron  deficiency anemia due to chronic blood loss    Rx / DC Orders ED Discharge Orders     None         Doretha Folks, MD 10/04/23 2228

## 2023-10-04 NOTE — ED Notes (Signed)
 Pt is upset it took nurse to hang 2nd unit. Pt is very eager to go home. MD made aware and 2nd unit has been initiated.

## 2023-10-04 NOTE — Telephone Encounter (Signed)
 I would advise her to stay and receive her transfusion.  Her hemoglobin has dropped a great deal and her repeat needed in the ED dropped even further.  A level this low will cause strain on her heart and other vital organs. Is also very importantly find out if she is having an active bleed.  Again, I recommend she stay.  She will require transfusion

## 2023-10-04 NOTE — Discharge Instructions (Addendum)
 Your stool did have blood in it today.  It will be important to follow-up with your GI doctor in the future for a colonoscopy and you may need an iron  infusion as your iron  was low also.  If you start noticing blood in your bowel movements, start feeling very weak, passing out or severe shortness of breath return to the emergency room.

## 2023-10-04 NOTE — ED Notes (Signed)
 Blood consent form signed in chart

## 2023-10-04 NOTE — Telephone Encounter (Signed)
 Please call patient: I am glad to see she did go to see emergency room and is being treated for new onset anemia.   The remainder of her labs are as followed: Electrolytes, liver and kidney function are normal. Iron  saturations are extremely low, from her blood loss. LDL/bad cholesterol is at goal.  Triglycerides appear mildly elevated, however this was not a fasting collection therefore this is not concerning at this level.

## 2023-10-05 ENCOUNTER — Encounter: Payer: Self-pay | Admitting: Gastroenterology

## 2023-10-05 ENCOUNTER — Telehealth: Payer: Self-pay | Admitting: Gastroenterology

## 2023-10-05 LAB — BPAM RBC
Blood Product Expiration Date: 202501252359
Blood Product Expiration Date: 202502042359
ISSUE DATE / TIME: 202501081759
ISSUE DATE / TIME: 202501082024
Unit Type and Rh: 5100
Unit Type and Rh: 7300

## 2023-10-05 LAB — TYPE AND SCREEN
ABO/RH(D): B POS
Antibody Screen: NEGATIVE
Unit division: 0
Unit division: 0

## 2023-10-05 LAB — URINALYSIS W MICROSCOPIC + REFLEX CULTURE
Bacteria, UA: NONE SEEN /HPF
Bilirubin Urine: NEGATIVE
Glucose, UA: NEGATIVE
Hgb urine dipstick: NEGATIVE
Hyaline Cast: NONE SEEN /LPF
Ketones, ur: NEGATIVE
Nitrites, Initial: NEGATIVE
Protein, ur: NEGATIVE
RBC / HPF: NONE SEEN /HPF (ref 0–2)
Specific Gravity, Urine: 1.013 (ref 1.001–1.035)
pH: 6 (ref 5.0–8.0)

## 2023-10-05 LAB — URINE CULTURE
MICRO NUMBER:: 15929819
SPECIMEN QUALITY:: ADEQUATE

## 2023-10-05 LAB — CULTURE INDICATED

## 2023-10-05 NOTE — Telephone Encounter (Signed)
 The pt has been advised that she should still complete her stool studies ordered per Dr Meridee Score.  These are testing for things other than blood.  The pt has been advised of the information and verbalized understanding.
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
## 2023-10-05 NOTE — Telephone Encounter (Signed)
 Inbound call from patient stating that Dr. Wilhelmenia told her to go to the hospital to have a blood infusion and she did. Patient stated that the Doctor at the hospital also did stool samples and found she had blood in her stool. Patient stated that Dr. Wilhelmenia wanted her to take some stool samples and bring them back to the lab, and patient is requesting a call to discuss if she still needs to do those or if he can use the one that was taken at the hospital. Please advise.

## 2023-10-06 ENCOUNTER — Telehealth: Payer: Self-pay

## 2023-10-06 ENCOUNTER — Other Ambulatory Visit: Payer: Self-pay

## 2023-10-06 DIAGNOSIS — D509 Iron deficiency anemia, unspecified: Secondary | ICD-10-CM

## 2023-10-06 LAB — DM TEMPLATE

## 2023-10-06 LAB — DRUG MONITORING PANEL 376104, URINE
Amphetamines: NEGATIVE ng/mL (ref <500)
Barbiturates: NEGATIVE ng/mL (ref <300)
Benzodiazepines: NEGATIVE ng/mL (ref <100)
Cocaine Metabolite: NEGATIVE ng/mL (ref <150)
Desmethyltramadol: >10000 ng/mL — ABNORMAL HIGH (ref <100)
Opiates: NEGATIVE ng/mL (ref <100)
Oxycodone: NEGATIVE ng/mL (ref <100)
Tramadol: >10000 ng/mL — ABNORMAL HIGH (ref <100)

## 2023-10-06 NOTE — Telephone Encounter (Signed)
 Patient called stated she was recommended to do a fecal study however she states her bowels right now are liquid not sure if she should proceed. Please advise.

## 2023-10-06 NOTE — Telephone Encounter (Signed)
 I have given the lab number to the pt to call and get appropriate advice for the stool testing.  The pt has been advised of the information and verbalized understanding.

## 2023-10-08 DIAGNOSIS — Z8719 Personal history of other diseases of the digestive system: Secondary | ICD-10-CM

## 2023-10-08 DIAGNOSIS — D509 Iron deficiency anemia, unspecified: Secondary | ICD-10-CM | POA: Insufficient documentation

## 2023-10-08 DIAGNOSIS — R197 Diarrhea, unspecified: Secondary | ICD-10-CM | POA: Insufficient documentation

## 2023-10-08 DIAGNOSIS — D500 Iron deficiency anemia secondary to blood loss (chronic): Secondary | ICD-10-CM | POA: Insufficient documentation

## 2023-10-08 HISTORY — DX: Personal history of other diseases of the digestive system: Z87.19

## 2023-10-10 ENCOUNTER — Other Ambulatory Visit (HOSPITAL_COMMUNITY): Payer: Self-pay

## 2023-10-10 ENCOUNTER — Telehealth: Payer: Self-pay

## 2023-10-10 ENCOUNTER — Other Ambulatory Visit: Payer: No Typology Code available for payment source

## 2023-10-10 DIAGNOSIS — D509 Iron deficiency anemia, unspecified: Secondary | ICD-10-CM

## 2023-10-10 NOTE — Telephone Encounter (Signed)
 Pharmacy Patient Advocate Encounter   Received notification from CoverMyMeds that prior authorization for HYDROcodone -Acetaminophen  5-325MG  tablets is required/requested.   Insurance verification completed.   The patient is insured through [facility] .   Per test claim: PA required; PA submitted to above mentioned insurance via CoverMyMeds Key/confirmation #/EOC  [id number] Status is pending

## 2023-10-11 ENCOUNTER — Ambulatory Visit (HOSPITAL_BASED_OUTPATIENT_CLINIC_OR_DEPARTMENT_OTHER): Payer: No Typology Code available for payment source

## 2023-10-11 ENCOUNTER — Encounter (HOSPITAL_BASED_OUTPATIENT_CLINIC_OR_DEPARTMENT_OTHER): Payer: Self-pay | Admitting: Obstetrics & Gynecology

## 2023-10-11 ENCOUNTER — Other Ambulatory Visit (HOSPITAL_COMMUNITY)
Admission: RE | Admit: 2023-10-11 | Discharge: 2023-10-11 | Disposition: A | Discharge status: Home / Self Care | Payer: No Typology Code available for payment source | Source: Ambulatory Visit | Attending: Obstetrics & Gynecology | Admitting: Obstetrics & Gynecology

## 2023-10-11 ENCOUNTER — Ambulatory Visit (HOSPITAL_BASED_OUTPATIENT_CLINIC_OR_DEPARTMENT_OTHER): Payer: No Typology Code available for payment source | Admitting: Obstetrics & Gynecology

## 2023-10-11 VITALS — BP 183/81 | HR 96 | Wt 129.2 lb

## 2023-10-11 DIAGNOSIS — Z8544 Personal history of malignant neoplasm of other female genital organs: Secondary | ICD-10-CM | POA: Diagnosis not present

## 2023-10-11 DIAGNOSIS — N950 Postmenopausal bleeding: Secondary | ICD-10-CM

## 2023-10-11 DIAGNOSIS — D071 Carcinoma in situ of vulva: Secondary | ICD-10-CM

## 2023-10-11 LAB — OVA AND PARASITE EXAMINATION
CONCENTRATE RESULT:: NONE SEEN
MICRO NUMBER:: 15952736
SPECIMEN QUALITY:: ADEQUATE
TRICHROME RESULT:: NONE SEEN

## 2023-10-11 LAB — CLOSTRIDIUM DIFFICILE TOXIN B, QUALITATIVE, REAL-TIME PCR: Toxigenic C. Difficile by PCR: NOT DETECTED
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
## 2023-10-12 NOTE — Telephone Encounter (Signed)
Insurance had additional questions. Answered questions and sent to Insurance via CMM.

## 2023-10-13 ENCOUNTER — Other Ambulatory Visit (HOSPITAL_COMMUNITY): Payer: Self-pay

## 2023-10-13 LAB — SURGICAL PATHOLOGY

## 2023-10-13 NOTE — Telephone Encounter (Signed)
Pharmacy Patient Advocate Encounter  Received notification from  [employer]  that Prior Authorization for HYDROcodone-Acetaminophen 5-325MG  tablets has been DENIED.  No reason given; No denial letter received via Fax or CMM. It has been requested and will be uploaded to the media tab once received.   PA #/Case ID/Reference #: [id number]

## 2023-10-14 LAB — STOOL CULTURE: E coli, Shiga toxin Assay: NEGATIVE

## 2023-10-14 LAB — FECAL FAT, QUALITATIVE
Fat Qual Neutral, Stl: NORMAL
Fat Qual Total, Stl: NORMAL

## 2023-10-14 LAB — CALPROTECTIN: Calprotectin: 30 ug/g

## 2023-10-16 ENCOUNTER — Other Ambulatory Visit: Payer: Self-pay

## 2023-10-16 DIAGNOSIS — D509 Iron deficiency anemia, unspecified: Secondary | ICD-10-CM

## 2023-10-16 NOTE — Telephone Encounter (Signed)
This has been denied in a separate encounter. Signing off on duplicate encounter. Thank you

## 2023-10-17 ENCOUNTER — Telehealth: Payer: Self-pay | Admitting: Oncology

## 2023-10-17 NOTE — Telephone Encounter (Signed)
Patient is aware of scheduled appointment times/dates for New Patient appointment

## 2023-10-17 NOTE — Telephone Encounter (Addendum)
Please inform patient her insurance has denied paying for chronic pain medications.  Some insurances just do not cover chronic opiods. If she is desiring to stay on medication moving forward she would need to pay out of pocket for those meds.  This is just for her notification.    Refills must be face to face visit.

## 2023-10-18 ENCOUNTER — Other Ambulatory Visit: Payer: Self-pay

## 2023-10-18 ENCOUNTER — Telehealth: Payer: Self-pay

## 2023-10-18 DIAGNOSIS — D509 Iron deficiency anemia, unspecified: Secondary | ICD-10-CM

## 2023-10-18 LAB — PANCREATIC ELASTASE, FECAL: Pancreatic Elastase-1, Stool: >800 ug/g (ref >200)

## 2023-10-18 NOTE — Telephone Encounter (Signed)
Spoke with patient regarding results/recommendations.  

## 2023-10-18 NOTE — Telephone Encounter (Signed)
Pharmacy please advise on holding Xarelto prior to endoscopy scheduled for 12/25/2023. Thank you.

## 2023-10-18 NOTE — Telephone Encounter (Signed)
[facility] Pre-operative Risk Assessment     Request for surgical clearance:     Endoscopy Procedure  What type of surgery is being performed?     Endo colon    When is this surgery scheduled?     12/25/23  What type of clearance is required ?   Pharmacy  Are there any medications that need to be held prior to surgery and how long? Xarelto  Practice name and name of physician performing surgery?      [facility]  What is your office phone and fax number?      Phone- [phone]  Fax- [phone]  Anesthesia type (None, local, MAC, general) ?       MAC   Please route your response to Hilma Favors RN

## 2023-10-20 NOTE — Telephone Encounter (Signed)
   Patient Name: [name]  DOB: [birth year] MRN: [id number]  Primary Cardiologist: Regan Lemming, MD  Clinical pharmacists have reviewed the patient's past medical history, labs, and current medications as part of preoperative protocol coverage. The following recommendations have been made:  Patient with diagnosis of Afib on Xarelto for anticoagulation.     Procedure: Endoscopy Date of procedure: 12/25/2023     CHA2DS2-VASc Score = 6   This indicates a 9.7% annual risk of stroke. The patient's score is based upon: CHF History: 1 HTN History: 1 Diabetes History: 0 Stroke History: 2 Vascular Disease History: 1 Age Score: 0 Gender Score: 1       CrCl 71 mL/min Platelet count 485 K       Recommend that pt only hold Xarelto for 1 day prior to procedure due to elevated cardiac risk. Please resume Xarelto as soon as possible postprocedure, at the discretion of the surgeon.    I will route this recommendation to the requesting party via Epic fax function and remove from pre-op pool.  Please call with questions.  Joylene Grapes, NP 10/20/2023, 7:55 AM

## 2023-10-20 NOTE — Telephone Encounter (Signed)
Patient with diagnosis of Afib on Xarelto for anticoagulation.    Procedure: Endoscopy Date of procedure: 12/25/2023   CHA2DS2-VASc Score = 6   This indicates a 9.7% annual risk of stroke. The patient's score is based upon: CHF History: 1 HTN History: 1 Diabetes History: 0 Stroke History: 2 Vascular Disease History: 1 Age Score: 0 Gender Score: 1     CrCl 71 mL/min Platelet count 485 K    Recommend that pt only hold Xarelto for 1 day prior to procedure due to elevated cardiac risk.    **This guidance is not considered finalized until pre-operative APP has relayed final recommendations.**

## 2023-10-23 NOTE — Telephone Encounter (Signed)
The pt has been advised that she should hold Xarelto 1 day prior to the upcoming procedure.  The pt has been advised of the information and verbalized understanding.

## 2023-10-24 ENCOUNTER — Telehealth: Payer: Self-pay

## 2023-10-24 NOTE — Telephone Encounter (Signed)
Copied from CRM [id number]. Topic: Clinical - Medication Question >> Oct 24, 2023 10:05 AM Deaijah H wrote: Reason for CRM: Patient called in stating she needs Tramadol back on her medication list / please call [phone]

## 2023-10-24 NOTE — Telephone Encounter (Signed)
Tramadol has been removed due to changes in medication therapy at last appt

## 2023-10-26 ENCOUNTER — Other Ambulatory Visit: Payer: Self-pay | Admitting: Oncology

## 2023-10-26 ENCOUNTER — Inpatient Hospital Stay: Payer: No Typology Code available for payment source | Attending: Oncology | Admitting: Oncology

## 2023-10-26 ENCOUNTER — Inpatient Hospital Stay: Payer: No Typology Code available for payment source

## 2023-10-26 ENCOUNTER — Encounter: Payer: Self-pay | Admitting: Oncology

## 2023-10-26 VITALS — BP 126/84 | HR 105 | Temp 98.7°F | Resp 18 | Ht 60.0 in | Wt 129.3 lb

## 2023-10-26 DIAGNOSIS — I4891 Unspecified atrial fibrillation: Secondary | ICD-10-CM | POA: Diagnosis not present

## 2023-10-26 DIAGNOSIS — Z7901 Long term (current) use of anticoagulants: Secondary | ICD-10-CM | POA: Diagnosis not present

## 2023-10-26 DIAGNOSIS — Z803 Family history of malignant neoplasm of breast: Secondary | ICD-10-CM | POA: Insufficient documentation

## 2023-10-26 DIAGNOSIS — Z951 Presence of aortocoronary bypass graft: Secondary | ICD-10-CM | POA: Diagnosis not present

## 2023-10-26 DIAGNOSIS — I252 Old myocardial infarction: Secondary | ICD-10-CM | POA: Diagnosis not present

## 2023-10-26 DIAGNOSIS — D509 Iron deficiency anemia, unspecified: Secondary | ICD-10-CM | POA: Insufficient documentation

## 2023-10-26 DIAGNOSIS — D500 Iron deficiency anemia secondary to blood loss (chronic): Secondary | ICD-10-CM

## 2023-10-26 DIAGNOSIS — Z800 Family history of malignant neoplasm of digestive organs: Secondary | ICD-10-CM | POA: Diagnosis not present

## 2023-10-26 DIAGNOSIS — E063 Autoimmune thyroiditis: Secondary | ICD-10-CM | POA: Insufficient documentation

## 2023-10-26 DIAGNOSIS — R21 Rash and other nonspecific skin eruption: Secondary | ICD-10-CM | POA: Diagnosis not present

## 2023-10-26 DIAGNOSIS — F17210 Nicotine dependence, cigarettes, uncomplicated: Secondary | ICD-10-CM | POA: Insufficient documentation

## 2023-10-26 DIAGNOSIS — Z8673 Personal history of transient ischemic attack (TIA), and cerebral infarction without residual deficits: Secondary | ICD-10-CM | POA: Insufficient documentation

## 2023-10-26 DIAGNOSIS — K219 Gastro-esophageal reflux disease without esophagitis: Secondary | ICD-10-CM | POA: Insufficient documentation

## 2023-10-26 HISTORY — DX: Rash and other nonspecific skin eruption: R21

## 2023-10-26 LAB — IRON AND IRON BINDING CAPACITY (CC-WL,HP ONLY)
Iron: 21 ug/dL — ABNORMAL LOW (ref 28–170)
Saturation Ratios: 6 % — ABNORMAL LOW (ref 10.4–31.8)
TIBC: 364 ug/dL (ref 250–450)
UIBC: 343 ug/dL (ref 148–442)

## 2023-10-26 LAB — FERRITIN: Ferritin: 10 ng/mL — ABNORMAL LOW (ref 11–307)

## 2023-10-26 LAB — COMPREHENSIVE METABOLIC PANEL
ALT: 12 U/L (ref 0–44)
AST: 21 U/L (ref 15–41)
Albumin: 4.1 g/dL (ref 3.5–5.0)
Alkaline Phosphatase: 80 U/L (ref 38–126)
Anion gap: 5 (ref 5–15)
BUN: 9 mg/dL (ref 8–23)
CO2: 28 mmol/L (ref 22–32)
Calcium: 9.1 mg/dL (ref 8.9–10.3)
Chloride: 104 mmol/L (ref 98–111)
Creatinine, Ser: 0.73 mg/dL (ref 0.44–1.00)
GFR, Estimated: >60 mL/min (ref >60)
Glucose, Bld: 121 mg/dL — ABNORMAL HIGH (ref 70–99)
Potassium: 3.9 mmol/L (ref 3.5–5.1)
Sodium: 137 mmol/L (ref 135–145)
Total Bilirubin: 0.2 mg/dL (ref 0.0–1.2)
Total Protein: 7.2 g/dL (ref 6.5–8.1)

## 2023-10-26 LAB — CBC WITH DIFFERENTIAL/PLATELET
Abs Immature Granulocytes: 0.06 10*3/uL (ref 0.00–0.07)
Basophils Absolute: 0.1 10*3/uL (ref 0.0–0.1)
Basophils Relative: 1 %
Eosinophils Absolute: 0.2 10*3/uL (ref 0.0–0.5)
Eosinophils Relative: 2 %
HCT: 34.6 % — ABNORMAL LOW (ref 36.0–46.0)
Hemoglobin: 10 g/dL — ABNORMAL LOW (ref 12.0–15.0)
Immature Granulocytes: 1 %
Lymphocytes Relative: 19 %
Lymphs Abs: 2.1 10*3/uL (ref 0.7–4.0)
MCH: 22.4 pg — ABNORMAL LOW (ref 26.0–34.0)
MCHC: 28.9 g/dL — ABNORMAL LOW (ref 30.0–36.0)
MCV: 77.4 fL — ABNORMAL LOW (ref 80.0–100.0)
Monocytes Absolute: 1 10*3/uL (ref 0.1–1.0)
Monocytes Relative: 9 %
Neutro Abs: 7.8 10*3/uL — ABNORMAL HIGH (ref 1.7–7.7)
Neutrophils Relative %: 68 %
Platelets: 535 10*3/uL — ABNORMAL HIGH (ref 150–400)
RBC: 4.47 MIL/uL (ref 3.87–5.11)
RDW: 24.8 % — ABNORMAL HIGH (ref 11.5–15.5)
WBC: 11.2 10*3/uL — ABNORMAL HIGH (ref 4.0–10.5)
nRBC: 0 % (ref 0.0–0.2)

## 2023-10-26 LAB — RETICULOCYTES
Immature Retic Fract: 35.4 % — ABNORMAL HIGH (ref 2.3–15.9)
RBC.: 4.47 MIL/uL (ref 3.87–5.11)
Retic Count, Absolute: 63.9 10*3/uL (ref 19.0–186.0)
Retic Ct Pct: 1.4 % (ref 0.4–3.1)

## 2023-10-26 LAB — DIRECT ANTIGLOBULIN TEST (NOT AT ARMC)
DAT, IgG: NEGATIVE
DAT, complement: NEGATIVE

## 2023-10-26 LAB — VITAMIN B12: Vitamin B-12: 437 pg/mL (ref 180–914)

## 2023-10-26 LAB — FOLATE: Folate: >40 ng/mL (ref >5.9)

## 2023-10-26 LAB — LACTATE DEHYDROGENASE: LDH: 188 U/L (ref 98–192)

## 2023-10-26 NOTE — Assessment & Plan Note (Signed)
Pruritic rash on back and shoulders, possibly medication-related (hydrocodone or tramadol). No recent changes in soap or detergent. History of similar rashes. Discussed oral Benadryl and cortisone cream with aloe vera for symptom relief. - Recommend oral Benadryl 25 mg at night - Apply cortisone cream with aloe vera - Monitor for improvement
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
## 2023-10-26 NOTE — Assessment & Plan Note (Addendum)
Chronic iron deficiency anemia with recent hemoglobin drop to 6.5 g/dL on 0/05/8118. No obvious source of significant blood loss identified.   Possible microscopic gastrointestinal bleeding, differential includes AVMs and other vascular malformations.  She is on chronic anticoagulation with Xarelto which could also be contributing.  She did receive 2 units of PRBC transfusion on 10/04/2023.  Labs today reveal hemoglobin of 10, hematocrit 34.6, MCV 71.4.  White count 11,000 with normal differential.  Platelet count elevated at 535,000, likely reactionary from iron deficiency.  Iron studies continue to show evidence of iron deficiency with iron saturation of 6%, iron decreased at 21.  Ferritin decreased at 10.  B12, folate, LDH, reticulocyte count are all within normal limits.  Coombs test negative.  Discussed IV iron infusion options and potential need for repeat infusions if ongoing blood loss is identified.   We got approval for Venofer.  Will proceed with Venofer 200 mg weekly x 5 doses at our infusion center at [facility].  She is currently scheduled for EGD and colonoscopy in late March 2025.  I will plan to see her after that.  - Recheck blood counts in two months

## 2023-10-27 LAB — HAPTOGLOBIN: Haptoglobin: 212 mg/dL (ref 37–355)

## 2023-10-28 ENCOUNTER — Telehealth: Payer: Self-pay | Admitting: Pharmacy Technician

## 2023-10-28 NOTE — Telephone Encounter (Signed)
Dr. Arlana Pouch, [place] Childes note:  Patient will be scheduled as soon as possible.  Auth Submission: NO AUTH NEEDED Site of care: Site of care: [facility] Payer: [employer] Medication & CPT/J Code(s) submitted: Venofer (Iron Sucrose) J1756 Route of submission (phone, fax, portal):  Phone # Fax # Auth type: Buy/Bill PB Units/visits requested: 5 Reference number:  Approval from: 10/28/23 to 03/26/24

## 2023-10-31 ENCOUNTER — Ambulatory Visit: Payer: No Typology Code available for payment source | Admitting: Family Medicine

## 2023-11-06 ENCOUNTER — Ambulatory Visit: Payer: No Typology Code available for payment source

## 2023-11-06 VITALS — BP 116/76 | HR 102 | Temp 98.0°F | Resp 18 | Ht 60.0 in | Wt 128.0 lb

## 2023-11-06 DIAGNOSIS — D509 Iron deficiency anemia, unspecified: Secondary | ICD-10-CM | POA: Diagnosis not present

## 2023-11-06 DIAGNOSIS — D500 Iron deficiency anemia secondary to blood loss (chronic): Secondary | ICD-10-CM

## 2023-11-06 MED ORDER — IRON SUCROSE 20 MG/ML IV SOLN
200.0000 mg | Freq: Once | INTRAVENOUS | Status: AC
  Administered 2023-11-06: 200 mg via INTRAVENOUS
  Filled 2023-11-06: qty 10

## 2023-11-06 MED ORDER — ACETAMINOPHEN 325 MG PO TABS
650.0000 mg | ORAL_TABLET | Freq: Once | ORAL | Status: AC
  Administered 2023-11-06: 650 mg via ORAL
  Filled 2023-11-06: qty 2

## 2023-11-06 MED ORDER — DIPHENHYDRAMINE HCL 25 MG PO CAPS
25.0000 mg | ORAL_CAPSULE | Freq: Once | ORAL | Status: AC
  Administered 2023-11-06: 25 mg via ORAL
  Filled 2023-11-06: qty 1

## 2023-11-06 NOTE — Progress Notes (Signed)
 Diagnosis: Iron Deficiency Anemia  Provider:  Chilton Greathouse MD  Procedure: IV Push  IV Type: Peripheral, IV Location: L Antecubital  Venofer (Iron Sucrose), Dose: 200 mg  Post Infusion IV Care: Observation period completed and Peripheral IV Discontinued  Discharge: Condition: Good, Destination: Home . AVS Declined  Performed by:  Loney Hering, LPN

## 2023-11-13 ENCOUNTER — Ambulatory Visit: Payer: No Typology Code available for payment source

## 2023-11-13 ENCOUNTER — Telehealth: Payer: Self-pay

## 2023-11-13 VITALS — BP 106/64 | HR 96 | Temp 97.6°F | Resp 18 | Ht 60.0 in | Wt 128.6 lb

## 2023-11-13 DIAGNOSIS — D509 Iron deficiency anemia, unspecified: Secondary | ICD-10-CM

## 2023-11-13 DIAGNOSIS — D500 Iron deficiency anemia secondary to blood loss (chronic): Secondary | ICD-10-CM

## 2023-11-13 MED ORDER — DIPHENHYDRAMINE HCL 25 MG PO CAPS
25.0000 mg | ORAL_CAPSULE | Freq: Once | ORAL | Status: AC
  Administered 2023-11-13: 25 mg via ORAL
  Filled 2023-11-13: qty 1

## 2023-11-13 MED ORDER — ACETAMINOPHEN 325 MG PO TABS
650.0000 mg | ORAL_TABLET | Freq: Once | ORAL | Status: AC
  Administered 2023-11-13: 650 mg via ORAL
  Filled 2023-11-13: qty 2

## 2023-11-13 MED ORDER — IRON SUCROSE 20 MG/ML IV SOLN
200.0000 mg | Freq: Once | INTRAVENOUS | Status: AC
  Administered 2023-11-13: 200 mg via INTRAVENOUS
  Filled 2023-11-13: qty 10

## 2023-11-13 NOTE — Telephone Encounter (Signed)
 Error

## 2023-11-13 NOTE — Telephone Encounter (Signed)
Copied from CRM [id number]. Topic: Clinical - Medication Question >> Nov 13, 2023  9:17 AM [place] E wrote: Reason for CRM: Patient calling in stating that she was prescribed Hydrocodone for 7 days to see if that would help with pain, and it did not. Insurance also did not cover this medication, so patient is wondering if she can get back on her traMADol (ULTRAM) 50 MG tablet.

## 2023-11-13 NOTE — Telephone Encounter (Signed)
In order to switch back to a different pain medication, that is in the controlled substance category, she will need to make an appointment.  As we discussed during her office visit, and any controlled substance prescriptions must be done face-to-face.  This can be virtual if she desires

## 2023-11-13 NOTE — Progress Notes (Signed)
Diagnosis: Iron Deficiency Anemia  Provider:  Chilton Greathouse MD  Procedure: IV Infusion  IV Type: Peripheral, IV Location: L Antecubital  Venofer (Iron Sucrose), Dose: 200 mg  Infusion Start Time: 1333   Post Infusion IV Care: Observation period completed and Peripheral IV Discontinued  Discharge: Condition: Good, Destination: Home . AVS Declined  Performed by:  Garnette Czech, RN

## 2023-11-13 NOTE — Telephone Encounter (Signed)
Spoke with patient regarding results/recommendations. Scheduled next day

## 2023-11-14 ENCOUNTER — Ambulatory Visit: Payer: No Typology Code available for payment source | Admitting: Family Medicine

## 2023-11-14 ENCOUNTER — Encounter: Payer: Self-pay | Admitting: Family Medicine

## 2023-11-14 VITALS — BP 116/84 | HR 105 | Temp 98.3°F | Wt 126.8 lb

## 2023-11-14 DIAGNOSIS — M170 Bilateral primary osteoarthritis of knee: Secondary | ICD-10-CM

## 2023-11-14 DIAGNOSIS — R269 Unspecified abnormalities of gait and mobility: Secondary | ICD-10-CM

## 2023-11-14 DIAGNOSIS — Z8673 Personal history of transient ischemic attack (TIA), and cerebral infarction without residual deficits: Secondary | ICD-10-CM

## 2023-11-14 MED ORDER — TRAMADOL HCL 50 MG PO TABS: 50.0000 mg | ORAL_TABLET | Freq: Four times a day (QID) | ORAL | 5 refills | Status: DC
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
## 2023-11-14 NOTE — Patient Instructions (Signed)
Return in about 24 weeks (around 04/30/2024) for Routine chronic condition follow-up.        Great to see you today.  I have refilled the medication(s) we provide.   If labs were collected or images ordered, we will inform you of  results once we have received them and reviewed. We will contact you either by echart message, or telephone call.  Please give ample time to the testing facility, and our office to run,  receive and review results. Please do not call inquiring of results, even if you can see them in your chart. We will contact you as soon as we are able. If it has been over 1 week since the test was completed, and you have not yet heard from [place], then please call us.    - echart message- for normal results that have been seen by the patient already.   - telephone call: abnormal results or if patient has not viewed results in their echart.  If a referral to a specialist was entered for you, please call us in 2 weeks if you have not heard from the specialist office to schedule.

## 2023-11-14 NOTE — Telephone Encounter (Signed)
Reason for CRM: Patient called in 10/24/23 about getting her Tramadol refilled and was told it was stopped due to switching to hydrocodone. Per patient insurance is not covering the hydrocodone and she is out of the tramadol. Patient does not have an pain meds and has been out for a few weeks and would like to hear back from Dr. Alan Ripper nurse today about a refill or the plan moving forward.    No further action needed. Pt was seen 2/18.

## 2023-11-20 ENCOUNTER — Ambulatory Visit (INDEPENDENT_AMBULATORY_CARE_PROVIDER_SITE_OTHER): Payer: No Typology Code available for payment source

## 2023-11-20 VITALS — BP 105/60 | HR 83 | Temp 97.9°F | Resp 16 | Ht 60.0 in | Wt 125.2 lb

## 2023-11-20 DIAGNOSIS — D509 Iron deficiency anemia, unspecified: Secondary | ICD-10-CM

## 2023-11-20 DIAGNOSIS — D500 Iron deficiency anemia secondary to blood loss (chronic): Secondary | ICD-10-CM

## 2023-11-20 MED ORDER — ACETAMINOPHEN 325 MG PO TABS
650.0000 mg | ORAL_TABLET | Freq: Once | ORAL | Status: AC
  Administered 2023-11-20: 650 mg via ORAL
  Filled 2023-11-20: qty 2

## 2023-11-20 MED ORDER — IRON SUCROSE 20 MG/ML IV SOLN
200.0000 mg | Freq: Once | INTRAVENOUS | Status: AC
  Administered 2023-11-20: 200 mg via INTRAVENOUS
  Filled 2023-11-20: qty 10

## 2023-11-20 MED ORDER — DIPHENHYDRAMINE HCL 25 MG PO CAPS
25.0000 mg | ORAL_CAPSULE | Freq: Once | ORAL | Status: AC
  Administered 2023-11-20: 25 mg via ORAL
  Filled 2023-11-20: qty 1

## 2023-11-20 NOTE — Progress Notes (Signed)
 Diagnosis: Iron Deficiency Anemia  Provider:  Chilton Greathouse MD  Procedure: IV Push  IV Type: Peripheral, IV Location: L Antecubital  Venofer (Iron Sucrose), Dose: 200 mg  Post Infusion IV Care: Observation period completed and Peripheral IV Discontinued  Discharge: Condition: Good, Destination: Home . AVS Declined  Performed by:  Loney Hering, LPN

## 2023-11-27 ENCOUNTER — Ambulatory Visit: Payer: No Typology Code available for payment source

## 2023-11-27 VITALS — BP 137/85 | HR 82 | Temp 97.9°F | Resp 20 | Ht 60.0 in | Wt 122.2 lb

## 2023-11-27 DIAGNOSIS — D509 Iron deficiency anemia, unspecified: Secondary | ICD-10-CM

## 2023-11-27 DIAGNOSIS — D500 Iron deficiency anemia secondary to blood loss (chronic): Secondary | ICD-10-CM

## 2023-11-27 MED ORDER — ACETAMINOPHEN 325 MG PO TABS
650.0000 mg | ORAL_TABLET | Freq: Once | ORAL | Status: AC
  Administered 2023-11-27: 650 mg via ORAL
  Filled 2023-11-27: qty 2

## 2023-11-27 MED ORDER — DIPHENHYDRAMINE HCL 25 MG PO CAPS
25.0000 mg | ORAL_CAPSULE | Freq: Once | ORAL | Status: AC
  Administered 2023-11-27: 25 mg via ORAL
  Filled 2023-11-27: qty 1

## 2023-11-27 MED ORDER — IRON SUCROSE 20 MG/ML IV SOLN
200.0000 mg | Freq: Once | INTRAVENOUS | Status: AC
  Administered 2023-11-27: 200 mg via INTRAVENOUS
  Filled 2023-11-27: qty 10

## 2023-11-27 NOTE — Progress Notes (Signed)
 Diagnosis: Iron Deficiency Anemia  Provider:  Chilton Greathouse MD  Procedure: IV Infusion  IV Type: Peripheral, IV Location: L Antecubital  Venofer (Iron Sucrose), Dose: 200 mg  Infusion Start Time: 1338   Post Infusion IV Care: Observation period completed and Peripheral IV Discontinued  Discharge: Condition: Good, Destination: Home . AVS Declined  Performed by:  Garnette Czech, RN

## 2023-11-29 ENCOUNTER — Other Ambulatory Visit: Payer: Self-pay

## 2023-11-29 DIAGNOSIS — I6523 Occlusion and stenosis of bilateral carotid arteries: Secondary | ICD-10-CM

## 2023-12-04 ENCOUNTER — Ambulatory Visit: Payer: No Typology Code available for payment source

## 2023-12-04 VITALS — BP 129/68 | HR 77 | Temp 97.9°F | Resp 18 | Ht 60.0 in | Wt 124.8 lb

## 2023-12-04 DIAGNOSIS — D509 Iron deficiency anemia, unspecified: Secondary | ICD-10-CM

## 2023-12-04 DIAGNOSIS — D500 Iron deficiency anemia secondary to blood loss (chronic): Secondary | ICD-10-CM

## 2023-12-04 MED ORDER — DIPHENHYDRAMINE HCL 25 MG PO CAPS
25.0000 mg | ORAL_CAPSULE | Freq: Once | ORAL | Status: AC
Start: 2023-12-04 — End: 2023-12-04
  Administered 2023-12-04: 25 mg via ORAL
  Filled 2023-12-04: qty 1

## 2023-12-04 MED ORDER — ACETAMINOPHEN 325 MG PO TABS
650.0000 mg | ORAL_TABLET | Freq: Once | ORAL | Status: AC
  Administered 2023-12-04: 650 mg via ORAL
  Filled 2023-12-04: qty 2

## 2023-12-04 MED ORDER — IRON SUCROSE 20 MG/ML IV SOLN
200.0000 mg | Freq: Once | INTRAVENOUS | Status: AC
  Administered 2023-12-04: 200 mg via INTRAVENOUS
  Filled 2023-12-04: qty 10

## 2023-12-04 NOTE — Progress Notes (Signed)
 Diagnosis: Acute Anemia  Provider:  Chilton Greathouse MD  Procedure: IV Push  IV Type: Peripheral, IV Location: L Antecubital  Venofer (Iron Sucrose), Dose: 200 mg  Post Infusion IV Care: Observation period completed and Peripheral IV Discontinued  Discharge: Condition: Good, Destination: Home . AVS Provided  Performed by:  Nat Math, RN

## 2023-12-12 ENCOUNTER — Telehealth: Payer: Self-pay | Admitting: Gastroenterology

## 2023-12-12 NOTE — Telephone Encounter (Signed)
 The pt has completed the iron infusions but wants to report that she developed nausea, abd bloating, constipation and loss of appetite after each infusion.  She states the symptoms began about 2-3 days after the  infusion.  The symptoms have resolved now after starting miralax. She is titrating as needed.  She has endoscopy and colonoscopy set up for 3/31 and will keep that as planned.

## 2023-12-12 NOTE — Telephone Encounter (Signed)
 PT is calling to discuss symptoms after having iron infusions. 4 of the 5 weeks she could not move. She was super sick and losing weight.  She has also been very nauseous and constipated. She would like to know her next steps now. Please advise.

## 2023-12-12 NOTE — Telephone Encounter (Signed)
 I was hoping that the patient would have less symptoms from having iron infusions rather than oral iron. I am glad that she is feeling better but sorry she had the symptoms. Will move forward with the planned procedures. Thanks. GM

## 2023-12-18 ENCOUNTER — Encounter (HOSPITAL_COMMUNITY): Payer: Self-pay | Admitting: Gastroenterology

## 2023-12-18 ENCOUNTER — Telehealth: Payer: Self-pay

## 2023-12-20 ENCOUNTER — Telehealth: Payer: Self-pay | Admitting: Gastroenterology

## 2023-12-20 ENCOUNTER — Other Ambulatory Visit (HOSPITAL_COMMUNITY): Payer: Self-pay

## 2023-12-20 ENCOUNTER — Other Ambulatory Visit: Payer: Self-pay

## 2023-12-20 ENCOUNTER — Telehealth: Payer: Self-pay

## 2023-12-20 NOTE — Telephone Encounter (Signed)
 Spoke with the patient. [facility] did not receive the prescription for her prep. She cannot come to [facility] to pick up a sample prep. She wants to use Miralax as her prep. She does not use My Chart. Instructed the patient over the telephone.

## 2023-12-20 NOTE — Telephone Encounter (Signed)
 Inbound call from patient stating she has spoke with [facility] a few times and the last time she was advised they do not have prescription for prep medication for 3/31 procedure. Please advise, thank you.

## 2023-12-20 NOTE — Telephone Encounter (Signed)
 Pharmacy Patient Advocate Encounter   Received notification from Onbase that prior authorization for traMADol HCl 50MG  tablets is required/requested.   Insurance verification completed.   The patient is insured through  [facility] [employer]  .   Per test claim: PA required; PA submitted to above mentioned insurance via CoverMyMeds Key/confirmation #/EOC [facility] Status is pending

## 2023-12-20 NOTE — Telephone Encounter (Signed)
 Pharmacy Patient Advocate Encounter  Received notification from [facility] [employer]  that Prior Authorization for traMADol HCl 50MG  tablets  has been DENIED.  Full denial letter will be uploaded to the media tab. See denial reason below.   PA #/Case ID/Reference #: [id number]

## 2023-12-21 NOTE — Telephone Encounter (Signed)
 Please inform patient that her insurance is not going to cover any type of pain medication, unless she sees a pain specialist. Her options are we could refer her to a pain clinic or she can consider paying for medication out-of-pocket, therefore not going through her insurance and continue to come here.  Please advise on her decision.

## 2023-12-21 NOTE — Telephone Encounter (Signed)
 Pt advised of provider recommendations. She will check with her pharmacy regarding out of pocket cost and let us know.

## 2023-12-25 ENCOUNTER — Ambulatory Visit (HOSPITAL_COMMUNITY): Payer: Self-pay

## 2023-12-25 ENCOUNTER — Encounter (HOSPITAL_COMMUNITY): Payer: Self-pay | Admitting: Gastroenterology

## 2023-12-25 ENCOUNTER — Encounter (HOSPITAL_COMMUNITY)
Admission: RE | Disposition: A | Discharge status: Home / Self Care | Payer: Self-pay | Source: Home / Self Care | Attending: Gastroenterology

## 2023-12-25 ENCOUNTER — Ambulatory Visit (HOSPITAL_BASED_OUTPATIENT_CLINIC_OR_DEPARTMENT_OTHER): Payer: Self-pay

## 2023-12-25 ENCOUNTER — Ambulatory Visit (HOSPITAL_COMMUNITY)
Admission: RE | Admit: 2023-12-25 | Discharge: 2023-12-25 | Disposition: A | Discharge status: Home / Self Care | Payer: No Typology Code available for payment source | Attending: Gastroenterology | Admitting: Gastroenterology

## 2023-12-25 ENCOUNTER — Other Ambulatory Visit: Payer: Self-pay

## 2023-12-25 DIAGNOSIS — K5730 Diverticulosis of large intestine without perforation or abscess without bleeding: Secondary | ICD-10-CM | POA: Diagnosis not present

## 2023-12-25 DIAGNOSIS — D122 Benign neoplasm of ascending colon: Secondary | ICD-10-CM | POA: Insufficient documentation

## 2023-12-25 DIAGNOSIS — D124 Benign neoplasm of descending colon: Secondary | ICD-10-CM | POA: Insufficient documentation

## 2023-12-25 DIAGNOSIS — Z7901 Long term (current) use of anticoagulants: Secondary | ICD-10-CM | POA: Insufficient documentation

## 2023-12-25 DIAGNOSIS — Z860101 Personal history of adenomatous and serrated colon polyps: Secondary | ICD-10-CM

## 2023-12-25 DIAGNOSIS — K644 Residual hemorrhoidal skin tags: Secondary | ICD-10-CM | POA: Diagnosis not present

## 2023-12-25 DIAGNOSIS — F419 Anxiety disorder, unspecified: Secondary | ICD-10-CM | POA: Insufficient documentation

## 2023-12-25 DIAGNOSIS — F17210 Nicotine dependence, cigarettes, uncomplicated: Secondary | ICD-10-CM | POA: Diagnosis not present

## 2023-12-25 DIAGNOSIS — Z1211 Encounter for screening for malignant neoplasm of colon: Secondary | ICD-10-CM | POA: Insufficient documentation

## 2023-12-25 DIAGNOSIS — I739 Peripheral vascular disease, unspecified: Secondary | ICD-10-CM | POA: Insufficient documentation

## 2023-12-25 DIAGNOSIS — K219 Gastro-esophageal reflux disease without esophagitis: Secondary | ICD-10-CM | POA: Diagnosis not present

## 2023-12-25 DIAGNOSIS — D120 Benign neoplasm of cecum: Secondary | ICD-10-CM | POA: Insufficient documentation

## 2023-12-25 DIAGNOSIS — I252 Old myocardial infarction: Secondary | ICD-10-CM | POA: Diagnosis not present

## 2023-12-25 DIAGNOSIS — D127 Benign neoplasm of rectosigmoid junction: Secondary | ICD-10-CM | POA: Diagnosis not present

## 2023-12-25 DIAGNOSIS — K2950 Unspecified chronic gastritis without bleeding: Secondary | ICD-10-CM | POA: Insufficient documentation

## 2023-12-25 DIAGNOSIS — Z79899 Other long term (current) drug therapy: Secondary | ICD-10-CM | POA: Insufficient documentation

## 2023-12-25 DIAGNOSIS — K449 Diaphragmatic hernia without obstruction or gangrene: Secondary | ICD-10-CM | POA: Diagnosis not present

## 2023-12-25 DIAGNOSIS — K2970 Gastritis, unspecified, without bleeding: Secondary | ICD-10-CM

## 2023-12-25 DIAGNOSIS — K5520 Angiodysplasia of colon without hemorrhage: Secondary | ICD-10-CM

## 2023-12-25 DIAGNOSIS — M1990 Unspecified osteoarthritis, unspecified site: Secondary | ICD-10-CM | POA: Diagnosis not present

## 2023-12-25 DIAGNOSIS — K641 Second degree hemorrhoids: Secondary | ICD-10-CM | POA: Diagnosis not present

## 2023-12-25 DIAGNOSIS — I2510 Atherosclerotic heart disease of native coronary artery without angina pectoris: Secondary | ICD-10-CM | POA: Diagnosis not present

## 2023-12-25 DIAGNOSIS — K209 Esophagitis, unspecified without bleeding: Secondary | ICD-10-CM | POA: Diagnosis not present

## 2023-12-25 DIAGNOSIS — D509 Iron deficiency anemia, unspecified: Secondary | ICD-10-CM | POA: Diagnosis not present

## 2023-12-25 DIAGNOSIS — K31819 Angiodysplasia of stomach and duodenum without bleeding: Secondary | ICD-10-CM | POA: Diagnosis not present

## 2023-12-25 DIAGNOSIS — K2289 Other specified disease of esophagus: Secondary | ICD-10-CM | POA: Diagnosis not present

## 2023-12-25 DIAGNOSIS — I509 Heart failure, unspecified: Secondary | ICD-10-CM | POA: Diagnosis not present

## 2023-12-25 DIAGNOSIS — K635 Polyp of colon: Secondary | ICD-10-CM

## 2023-12-25 HISTORY — PX: ESOPHAGOGASTRODUODENOSCOPY (EGD) WITH PROPOFOL: SHX5813

## 2023-12-25 HISTORY — PX: HOT HEMOSTASIS: SHX5433

## 2023-12-25 HISTORY — PX: COLONOSCOPY WITH PROPOFOL: SHX5780

## 2023-12-25 HISTORY — PX: LUNG BIOPSY: SHX5088

## 2023-12-25 HISTORY — PX: POLYPECTOMY: SHX5525

## 2023-12-25 HISTORY — DX: Gastritis, unspecified, without bleeding: K29.70

## 2023-12-25 HISTORY — DX: Personal history of adenomatous and serrated colon polyps: Z86.0101

## 2023-12-25 SURGERY — ESOPHAGOGASTRODUODENOSCOPY (EGD) WITH PROPOFOL
Anesthesia: Monitor Anesthesia Care

## 2023-12-25 MED ORDER — PHENYLEPHRINE 80 MCG/ML (10ML) SYRINGE FOR IV PUSH (FOR BLOOD PRESSURE SUPPORT)
PREFILLED_SYRINGE | INTRAVENOUS | Status: DC | PRN
  Administered 2023-12-25: 80 ug via INTRAVENOUS

## 2023-12-25 MED ORDER — PROPOFOL 10 MG/ML IV BOLUS
INTRAVENOUS | Status: DC | PRN
  Administered 2023-12-25: 40 mg via INTRAVENOUS
  Administered 2023-12-25 (×2): 30 mg via INTRAVENOUS

## 2023-12-25 MED ORDER — FERROUS GLUCONATE 324 (38 FE) MG PO TABS: 324.0000 mg | ORAL_TABLET | ORAL | 3 refills | Status: DC

## 2023-12-25 MED ORDER — PROPOFOL 1000 MG/100ML IV EMUL
INTRAVENOUS | Status: AC
  Filled 2023-12-25: qty 100

## 2023-12-25 MED ORDER — SODIUM CHLORIDE 0.9 % IV SOLN
INTRAVENOUS | Status: DC
Start: 2023-12-25 — End: 2023-12-25

## 2023-12-25 MED ORDER — PROPOFOL 500 MG/50ML IV EMUL
INTRAVENOUS | Status: DC | PRN
  Administered 2023-12-25: 125 ug/kg/min via INTRAVENOUS

## 2023-12-25 MED ORDER — OMEPRAZOLE 40 MG PO CPDR: 40.0000 mg | DELAYED_RELEASE_CAPSULE | Freq: Every day | ORAL | 6 refills | Status: AC

## 2023-12-25 SURGICAL SUPPLY — 23 items
BLOCK BITE 60FR ADLT L/F BLUE (MISCELLANEOUS) ×2 IMPLANT
ELECT REM PT RETURN 9FT ADLT (ELECTROSURGICAL) IMPLANT
ELECTRODE REM PT RTRN 9FT ADLT (ELECTROSURGICAL) IMPLANT
FLOOR PAD 36X40 (MISCELLANEOUS) ×2 IMPLANT
FORCEP RJ3 GP 1.8X160 W-NEEDLE (CUTTING FORCEPS) IMPLANT
FORCEPS BIOP RAD 4 LRG CAP 4 (CUTTING FORCEPS) IMPLANT
FORCEPS BIOP RJ4 240 W/NDL (CUTTING FORCEPS) IMPLANT
FORCEPS BXJMBJMB 240X2.8X (CUTTING FORCEPS) IMPLANT
INJECTOR/SNARE I SNARE (MISCELLANEOUS) IMPLANT
LUBRICANT JELLY 4.5OZ STERILE (MISCELLANEOUS) IMPLANT
MANIFOLD NEPTUNE II (INSTRUMENTS) IMPLANT
NDL SCLEROTHERAPY 25GX240 (NEEDLE) IMPLANT
NEEDLE SCLEROTHERAPY 25GX240 (NEEDLE) IMPLANT
PAD FLOOR 36X40 (MISCELLANEOUS) ×2 IMPLANT
PROBE APC STR FIRE (PROBE) IMPLANT
PROBE INJECTION GOLD 7FR (MISCELLANEOUS) IMPLANT
SNARE ROTATE MED OVAL 20MM (MISCELLANEOUS) IMPLANT
SNARE SHORT THROW 13M SML OVAL (MISCELLANEOUS) IMPLANT
SYR 50ML LL SCALE MARK (SYRINGE) IMPLANT
TRAP SPECIMEN MUCOUS 40CC (MISCELLANEOUS) IMPLANT
TUBING ENDO SMARTCAP PENTAX (MISCELLANEOUS) ×4 IMPLANT
TUBING IRRIGATION ENDOGATOR (MISCELLANEOUS) ×2 IMPLANT
WATER STERILE IRR 1000ML POUR (IV SOLUTION) IMPLANT

## 2023-12-25 NOTE — Op Note (Signed)
 [facility] Patient Name: [name] Procedure Date: 12/25/2023 MRN: [id number] Attending MD: Corliss Parish , MD, [id number] Date of Birth: [birth year] CSN: [id number] Age: 63 Admit Type: Outpatient Procedure:                Colonoscopy Indications:              Surveillance: Personal history of adenomatous                            polyps on last colonoscopy 3 years ago, Incidental                            - Iron deficiency anemia Providers:                Corliss Parish, MD, Norman Clay, RN, Harrington Challenger,                            Technician Referring MD:              Medicines:                Monitored Anesthesia Care Complications:            No immediate complications. Estimated Blood Loss:     Estimated blood loss was minimal. Procedure:                Pre-Anesthesia Assessment:                           - Prior to the procedure, a History and Physical                            was performed, and patient medications and                            allergies were reviewed. The patient's tolerance of                            previous anesthesia was also reviewed. The risks                            and benefits of the procedure and the sedation                            options and risks were discussed with the patient.                            All questions were answered, and informed consent                            was obtained. Prior Anticoagulants: The patient has                            taken no anticoagulant or antiplatelet agents. ASA                            Grade  Assessment: III - A patient with severe                            systemic disease. After reviewing the risks and                            benefits, the patient was deemed in satisfactory                            condition to undergo the procedure.                           After obtaining informed consent, the colonoscope                            was passed  under direct vision. Throughout the                            procedure, the patient's blood pressure, pulse, and                            oxygen saturations were monitored continuously. The                            PCF-HQ190L ([id number] Olympus colonoscope was                            introduced through the anus and advanced to the 3                            cm into the ileum. The colonoscopy was performed                            without difficulty. The patient tolerated the                            procedure. The quality of the bowel preparation was                            adequate. The terminal ileum, ileocecal valve,                            appendiceal orifice, and rectum were photographed. Scope In: 11:16:30 AM Scope Out: 11:34:35 AM Total Procedure Duration: 0 hours 18 minutes 5 seconds  Findings:      Skin tags were found on perianal exam.      The digital rectal exam findings include hemorrhoids. Pertinent       negatives include no palpable rectal lesions.      The terminal ileum and ileocecal valve appeared normal.      Seven sessile polyps were found in the recto-sigmoid colon (3),       descending colon (1), ascending colon (2) and cecum (1). The polyps were       2 to 6 mm in size. These polyps were removed with a cold snare.  Resection and retrieval were complete.      Multiple small-mouthed diverticula were found in the recto-sigmoid colon       and sigmoid colon.      Normal mucosa was found in the entire colon otherwise.      Non-bleeding non-thrombosed external and internal hemorrhoids were found       during retroflexion, during perianal exam and during digital exam. The       hemorrhoids were Grade II (internal hemorrhoids that prolapse but reduce       spontaneously). Impression:               - Perianal skin tags found on perianal exam.                            Hemorrhoids found on digital rectal exam.                           - The  examined portion of the ileum was normal.                           - Seven 2 to 6 mm polyps at the recto-sigmoid                            colon, in the descending colon, in the ascending                            colon and in the cecum, removed with a cold snare.                            Resected and retrieved.                           - Diverticulosis in the recto-sigmoid colon and in                            the sigmoid colon.                           - Normal mucosa in the entire examined colon                            otherwise.                           - Non-bleeding non-thrombosed external and internal                            hemorrhoids. Moderate Sedation:      Not Applicable - Patient had care per Anesthesia. Recommendation:           - The patient will be observed post-procedure,                            until all discharge criteria are met.                           -  Discharge patient to home.                           - Patient has a contact number available for                            emergencies. The signs and symptoms of potential                            delayed complications were discussed with the                            patient. Return to normal activities tomorrow.                            Written discharge instructions were provided to the                            patient.                           - High fiber diet.                           - Continue present medications.                           - Await pathology results.                           - Repeat colonoscopy in 3 years for surveillance                            based on pathology results.                           - Based on the findings of EGD/colonoscopy and                            findings of angioectasias within the small bowel on                            upper endoscopy, concerned that she may have other                            AVMs or angioectasias in the  distal small bowel.                            She also had gastritis that was hemorrhagic, so I                            think it would be reasonable to give the body an                            opportunity to see what things  look like in                            approximately 4 to 6 weeks. If she continues to                            have iron deficiency anemia, I recommend a video                            capsule endoscopy be considered.                           - The findings and recommendations were discussed                            with the patient.                           - The findings and recommendations were discussed                            with the patient's family. Procedure Code(s):        --- Professional ---                           [phone], Colonoscopy, flexible; with removal of                            tumor(s), polyp(s), or other lesion(s) by snare                            technique Diagnosis Code(s):        --- Professional ---                           Z86.010, Personal history of colonic polyps                           K64.1, Second degree hemorrhoids                           D12.7, Benign neoplasm of rectosigmoid junction                           D12.4, Benign neoplasm of descending colon                           D12.2, Benign neoplasm of ascending colon                           D12.0, Benign neoplasm of cecum                           K57.30, Diverticulosis of large intestine without                            perforation or  abscess without bleeding CPT copyright 2022 [employer]. All rights reserved. The codes documented in this report are preliminary and upon coder review may  be revised to meet current compliance requirements. Corliss Parish, MD 12/25/2023 11:53:35 AM Number of Addenda: 0

## 2023-12-25 NOTE — Anesthesia Procedure Notes (Signed)
 Procedure Name: MAC Date/Time: 12/25/2023 10:49 AM  Performed by: Vanessa Pierce, CRNAPre-anesthesia Checklist: Patient identified, Emergency Drugs available, Suction available and Patient being monitored Patient Re-evaluated:Patient Re-evaluated prior to induction Oxygen Delivery Method: Nasal cannula

## 2023-12-25 NOTE — Anesthesia Preprocedure Evaluation (Signed)
 Anesthesia Evaluation  Patient identified by MRN, date of birth, ID band Patient awake    Reviewed: Allergy & Precautions, H&P , NPO status , Patient's Chart, lab work & pertinent test results  Airway Mallampati: II   Neck ROM: full    Dental   Pulmonary Current Smoker   breath sounds clear to auscultation       Cardiovascular + CAD, + Past MI, + Peripheral Vascular Disease and +CHF   Rhythm:regular Rate:Normal     Neuro/Psych   Anxiety      Neuromuscular disease CVA    GI/Hepatic ,GERD  ,,  Endo/Other    Renal/GU Renal diseasestones     Musculoskeletal  (+) Arthritis ,    Abdominal   Peds  Hematology   Anesthesia Other Findings   Reproductive/Obstetrics                             Anesthesia Physical Anesthesia Plan  ASA: 3  Anesthesia Plan: MAC   Post-op Pain Management:    Induction: Intravenous  PONV Risk Score and Plan: 1 and Propofol infusion and Treatment may vary due to age or medical condition  Airway Management Planned: Nasal Cannula  Additional Equipment:   Intra-op Plan:   Post-operative Plan:   Informed Consent: I have reviewed the patients History and Physical, chart, labs and discussed the procedure including the risks, benefits and alternatives for the proposed anesthesia with the patient or authorized representative who has indicated his/her understanding and acceptance.     Dental advisory given  Plan Discussed with: CRNA, Anesthesiologist and Surgeon  Anesthesia Plan Comments:        Anesthesia Quick Evaluation

## 2023-12-25 NOTE — Discharge Instructions (Signed)

## 2023-12-25 NOTE — Op Note (Addendum)
 [facility] Patient Name: [name] Procedure Date: 12/25/2023 MRN: [id number] Attending MD: Corliss Parish , MD, [id number] Date of Birth: [birth year] CSN: [id number] Age: 63 Admit Type: Outpatient Procedure:                Upper GI endoscopy Indications:              Iron deficiency anemia Providers:                Corliss Parish, MD, Norman Clay, RN, Harrington Challenger,                            Technician Referring MD:              Medicines:                Monitored Anesthesia Care Complications:            No immediate complications. Estimated Blood Loss:     Estimated blood loss was minimal. Procedure:                Pre-Anesthesia Assessment:                           - Prior to the procedure, a History and Physical                            was performed, and patient medications and                            allergies were reviewed. The patient's tolerance of                            previous anesthesia was also reviewed. The risks                            and benefits of the procedure and the sedation                            options and risks were discussed with the patient.                            All questions were answered, and informed consent                            was obtained. Prior Anticoagulants: The patient has                            taken no anticoagulant or antiplatelet agents. ASA                            Grade Assessment: III - A patient with severe                            systemic disease. After reviewing the risks and  benefits, the patient was deemed in satisfactory                            condition to undergo the procedure.                           After obtaining informed consent, the endoscope was                            passed under direct vision. Throughout the                            procedure, the patient's blood pressure, pulse, and                            oxygen  saturations were monitored continuously. The                            [id number]-H190 ([id number]) Olympus endoscope was introduced                            through the mouth, and advanced to the second part                            of duodenum. The upper GI endoscopy was                            accomplished without difficulty. The patient                            tolerated the procedure. Scope In: Scope Out: Findings:      No gross lesions were noted in the proximal esophagus and in the mid       esophagus.      LA Grade A (one or more mucosal breaks less than 5 mm, not extending       between tops of 2 mucosal folds) esophagitis with no bleeding was found       in the distal esophagus.      The Z-line was irregular and was found 36 cm from the incisors.      A 2 cm hiatal hernia was present.      Patchy moderate inflammation characterized by adherent blood, erythema       and granularity was found in the entire examined stomach. Biopsies were       taken with a cold forceps for histology and Helicobacter pylori testing.      Three small angioectasias with typical arborization were found in the       duodenal bulb (2) and in the second portion of the duodenum (1).       Fulguration to ablate the lesion to prevent bleeding by argon plasma was       successful.      No other gross lesions were noted in the duodenal bulb, in the first       portion of the duodenum and in the second portion of the duodenum. Impression:               -  No gross lesions in the proximal esophagus and in                            the mid esophagus. LA Grade A esophagitis with no                            bleeding found distally. Z-line irregular, 36 cm                            from the incisors.                           - 2 cm hiatal hernia.                           - Gastritis. Biopsied.                           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 Treated with                            argon  plasma coagulation (APC).                           - No other gross lesions in the duodenal bulb, in                            the first portion of the duodenum and in the second                            portion of the duodenum. Moderate Sedation:      Not Applicable - Patient had care per Anesthesia. Recommendation:           - Proceed to scheduled colonoscopy.                           - Initiate Omeprazole 40 mg twice daily for 2                            months and then continue once daily.                           - Observe patient's clinical course.                           - Await pathology results.                           - Consideration of repeat EGD in 64-months to ensure                            healing of esophagitis can be had.                           - Discussion of further IDA evaluation on  Colonoscopy report.                           - The findings and recommendations were discussed                            with the patient.                           - The findings and recommendations were discussed                            with the patient's family. Procedure Code(s):        --- Professional ---                           [phone], 59, Esophagogastroduodenoscopy, flexible,                            transoral; with control of bleeding, any method                           [id number], Esophagogastroduodenoscopy, flexible,                            transoral; with biopsy, single or multiple Diagnosis Code(s):        --- Professional ---                           K20.90, Esophagitis, unspecified without bleeding                           K22.89, Other specified disease of esophagus                           K44.9, Diaphragmatic hernia without obstruction or                            gangrene                           K29.70, Gastritis, unspecified, without bleeding                           K31.819, Angiodysplasia of stomach and  duodenum                            without bleeding                           D50.9, Iron deficiency anemia, unspecified CPT copyright 2022 American [employer] Association. All rights reserved. The codes documented in this report are preliminary and upon coder review may  be revised to meet current compliance requirements. Corliss Parish, MD 12/25/2023 11:14:13 AM Number of Addenda: 0
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
## 2023-12-25 NOTE — Anesthesia Postprocedure Evaluation (Signed)
 Anesthesia Post Note  Patient: [name]  Procedure(s) Performed: ESOPHAGOGASTRODUODENOSCOPY (EGD) WITH PROPOFOL COLONOSCOPY WITH PROPOFOL BIOPSY EGD, WITH ARGON PLASMA COAGULATION POLYPECTOMY     Patient location during evaluation: Endoscopy Anesthesia Type: MAC Level of consciousness: awake and alert Pain management: pain level controlled Vital Signs Assessment: post-procedure vital signs reviewed and stable Respiratory status: spontaneous breathing, nonlabored ventilation, respiratory function stable and patient connected to nasal cannula oxygen Cardiovascular status: stable and blood pressure returned to baseline Postop Assessment: no apparent nausea or vomiting Anesthetic complications: no   No notable events documented.  Last Vitals:  Vitals:   12/25/23 1210 12/25/23 1212  BP: (!) 179/87 (!) 163/87  Pulse: 93 94  Resp: (!) 21 (!) 26  Temp:    SpO2: 95% 95%    Last Pain:  Vitals:   12/25/23 1212  TempSrc:   PainSc: 0-No pain                 [name] S

## 2023-12-25 NOTE — Transfer of Care (Signed)
 Immediate Anesthesia Transfer of Care Note  Patient: [name]  Procedure(s) Performed: ESOPHAGOGASTRODUODENOSCOPY (EGD) WITH PROPOFOL COLONOSCOPY WITH PROPOFOL BIOPSY EGD, WITH ARGON PLASMA COAGULATION POLYPECTOMY  Patient Location: PACU  Anesthesia Type:MAC  Level of Consciousness: awake  Airway & Oxygen Therapy: Patient Spontanous Breathing  Post-op Assessment: Report given to RN and Post -op Vital signs reviewed and stable  Post vital signs: Reviewed and stable  Last Vitals:  Vitals Value Taken Time  BP 168/84 12/25/23 1138  Temp    Pulse 91 12/25/23 1140  Resp 14 12/25/23 1140  SpO2 99 % 12/25/23 1140  Vitals shown include unfiled device data.  Last Pain:  Vitals:   12/25/23 1004  TempSrc: Tympanic  PainSc: 0-No pain         Complications: No notable events documented.

## 2023-12-26 ENCOUNTER — Encounter (HOSPITAL_COMMUNITY): Payer: Self-pay | Admitting: Gastroenterology

## 2023-12-26 LAB — SURGICAL PATHOLOGY

## 2023-12-27 NOTE — Telephone Encounter (Signed)
 Patient called stated she had a double procedure on Monday and she has not had a bowel movement since. Requested a call back.

## 2023-12-28 ENCOUNTER — Encounter: Payer: Self-pay | Admitting: Gastroenterology

## 2023-12-28 NOTE — Telephone Encounter (Signed)
 I was able to speak with the pt and discuss that it can take several days for her bowel habits to return to normal.  She will take a capful of Miralax daily and titrate as needed to get things moving.  She will call back if this does not help.

## 2023-12-28 NOTE — Telephone Encounter (Signed)
error 

## 2023-12-29 ENCOUNTER — Other Ambulatory Visit: Payer: Self-pay | Admitting: Cardiology

## 2024-01-01 ENCOUNTER — Other Ambulatory Visit: Payer: Self-pay

## 2024-01-01 DIAGNOSIS — D509 Iron deficiency anemia, unspecified: Secondary | ICD-10-CM

## 2024-01-04 ENCOUNTER — Inpatient Hospital Stay (HOSPITAL_BASED_OUTPATIENT_CLINIC_OR_DEPARTMENT_OTHER): Payer: No Typology Code available for payment source | Admitting: Oncology

## 2024-01-04 ENCOUNTER — Inpatient Hospital Stay: Payer: No Typology Code available for payment source | Attending: Oncology

## 2024-01-04 ENCOUNTER — Other Ambulatory Visit: Payer: Self-pay

## 2024-01-04 ENCOUNTER — Other Ambulatory Visit: Payer: Self-pay | Admitting: Oncology

## 2024-01-04 VITALS — BP 118/68 | HR 109 | Temp 97.7°F | Resp 18 | Ht 60.0 in | Wt 118.5 lb

## 2024-01-04 DIAGNOSIS — Z800 Family history of malignant neoplasm of digestive organs: Secondary | ICD-10-CM | POA: Diagnosis not present

## 2024-01-04 DIAGNOSIS — R110 Nausea: Secondary | ICD-10-CM | POA: Insufficient documentation

## 2024-01-04 DIAGNOSIS — Z8719 Personal history of other diseases of the digestive system: Secondary | ICD-10-CM | POA: Diagnosis not present

## 2024-01-04 DIAGNOSIS — D75839 Thrombocytosis, unspecified: Secondary | ICD-10-CM

## 2024-01-04 DIAGNOSIS — R21 Rash and other nonspecific skin eruption: Secondary | ICD-10-CM | POA: Insufficient documentation

## 2024-01-04 DIAGNOSIS — Z803 Family history of malignant neoplasm of breast: Secondary | ICD-10-CM | POA: Insufficient documentation

## 2024-01-04 DIAGNOSIS — K5730 Diverticulosis of large intestine without perforation or abscess without bleeding: Secondary | ICD-10-CM | POA: Diagnosis not present

## 2024-01-04 DIAGNOSIS — D500 Iron deficiency anemia secondary to blood loss (chronic): Secondary | ICD-10-CM | POA: Diagnosis not present

## 2024-01-04 DIAGNOSIS — K5900 Constipation, unspecified: Secondary | ICD-10-CM | POA: Diagnosis not present

## 2024-01-04 DIAGNOSIS — Z8673 Personal history of transient ischemic attack (TIA), and cerebral infarction without residual deficits: Secondary | ICD-10-CM | POA: Diagnosis not present

## 2024-01-04 DIAGNOSIS — K644 Residual hemorrhoidal skin tags: Secondary | ICD-10-CM | POA: Insufficient documentation

## 2024-01-04 DIAGNOSIS — K648 Other hemorrhoids: Secondary | ICD-10-CM | POA: Insufficient documentation

## 2024-01-04 DIAGNOSIS — I4891 Unspecified atrial fibrillation: Secondary | ICD-10-CM | POA: Diagnosis not present

## 2024-01-04 DIAGNOSIS — K2091 Esophagitis, unspecified with bleeding: Secondary | ICD-10-CM | POA: Diagnosis not present

## 2024-01-04 DIAGNOSIS — Z7901 Long term (current) use of anticoagulants: Secondary | ICD-10-CM | POA: Insufficient documentation

## 2024-01-04 DIAGNOSIS — Z951 Presence of aortocoronary bypass graft: Secondary | ICD-10-CM | POA: Diagnosis not present

## 2024-01-04 DIAGNOSIS — D509 Iron deficiency anemia, unspecified: Secondary | ICD-10-CM | POA: Diagnosis present

## 2024-01-04 LAB — CBC WITH DIFFERENTIAL (CANCER CENTER ONLY)
Abs Immature Granulocytes: 0.02 10*3/uL (ref 0.00–0.07)
Basophils Absolute: 0.1 10*3/uL (ref 0.0–0.1)
Basophils Relative: 1 %
Eosinophils Absolute: 0.1 10*3/uL (ref 0.0–0.5)
Eosinophils Relative: 1 %
HCT: 42.3 % (ref 36.0–46.0)
Hemoglobin: 13.9 g/dL (ref 12.0–15.0)
Immature Granulocytes: 0 %
Lymphocytes Relative: 18 %
Lymphs Abs: 1.8 10*3/uL (ref 0.7–4.0)
MCH: 29.2 pg (ref 26.0–34.0)
MCHC: 32.9 g/dL (ref 30.0–36.0)
MCV: 88.9 fL (ref 80.0–100.0)
Monocytes Absolute: 1 10*3/uL (ref 0.1–1.0)
Monocytes Relative: 10 %
Neutro Abs: 7.2 10*3/uL (ref 1.7–7.7)
Neutrophils Relative %: 70 %
Platelet Count: 424 10*3/uL — ABNORMAL HIGH (ref 150–400)
RBC: 4.76 MIL/uL (ref 3.87–5.11)
RDW: 19.8 % — ABNORMAL HIGH (ref 11.5–15.5)
WBC Count: 10.2 10*3/uL (ref 4.0–10.5)
nRBC: 0 % (ref 0.0–0.2)

## 2024-01-04 LAB — FERRITIN: Ferritin: 91 ng/mL (ref 11–307)

## 2024-01-04 LAB — IRON AND IRON BINDING CAPACITY (CC-WL,HP ONLY)
Iron: 34 ug/dL (ref 28–170)
Saturation Ratios: 12 % (ref 10.4–31.8)
TIBC: 284 ug/dL (ref 250–450)
UIBC: 250 ug/dL (ref 148–442)

## 2024-01-04 MED ORDER — ACCRUFER 30 MG PO CAPS: 30.0000 mg | ORAL_CAPSULE | Freq: Two times a day (BID) | ORAL | 5 refills | Status: DC

## 2024-01-04 NOTE — Progress Notes (Signed)
 [name]  HEMATOLOGY CLINIC PROGRESS NOTE  PATIENT NAME: [name]   MR#: [id number] DOB: 1961/08/12  Patient Care Team: Natalia Leatherwood, DO as PCP - General (Family Medicine) Marvel Plan, MD as Consulting Physician (Neurology) Marcello Fennel, MD as Consulting Physician (Physical Medicine and Rehabilitation) Julieanne Cotton, MD as Consulting Physician (Interventional Radiology) Mansouraty, Netty Starring., MD as Consulting Physician (Gastroenterology) Darnell Level, MD as Consulting Physician (General Surgery) Crista Elliot, MD as Consulting Physician (Urology) Jonelle Sidle, MD as Consulting Physician (Cardiology) Regan Lemming, MD as Consulting Physician (Cardiology)  Date of visit: 01/04/2024   ASSESSMENT & PLAN:   [name] is a 63 y.o. lady with a past medical history of CVA, atrial fibrillation (on anticoagulation), CAD (status post CABG in Feb 2019), cardiomyopathy, recurrent idiopathic acute pancreatitis (status post ERCP with CBD and PD sphincterotomies for equivocal SOD findings on manometry with recurrent PD stenosis), status post cholecystectomy, pancreatic cyst, GERD, colon polyps (TAs) , was referred to our service in January 2025 for evaluation of microcytic, hypochromic anemia.  Presumed to be occult GI blood loss.  Iron deficiency anemia due to chronic blood loss Chronic iron deficiency anemia with recent hemoglobin drop to 6.5 g/dL on 09/28/2438. No obvious source of significant blood loss identified.   Possible microscopic gastrointestinal bleeding, differential includes AVMs and other vascular malformations.  She is on chronic anticoagulation with Xarelto which could also be contributing.  She did receive 2 units of PRBC transfusion on 10/04/2023.   On initial consultation with [place] on 10/26/2023, labs revealed hemoglobin of 10, hematocrit 34.6, MCV 71.4.  White count 11,000 with normal differential.  Platelet count  elevated at 535,000, likely reactionary from iron deficiency.  Iron studies continued to show evidence of iron deficiency with iron saturation of 6%, iron decreased at 21.  Ferritin decreased at 10.  B12, folate, LDH, reticulocyte count were all within normal limits.  Coombs test negative.   Discussed IV iron infusion options and potential need for repeat infusions if ongoing blood loss is identified.    We got approval for Venofer and she was given Venofer 200 mg weekly x 5 doses at our infusion center at [facility].  On 12/25/2023 she underwent EGD and colonoscopy.  EGD showed LA grade A esophagitis but no other gross lesions in the esophagus.  Evidence of gastritis was noted.  3 angioectasias were noted in the duodenum, treated with argon plasma coagulation.  Colonoscopy showed seven 2 to 6 mm polyps at the rectosigmoid colon, descending colon, ascending colon and cecum.  Diverticulosis in the rectosigmoid colon and sigmoid colon.  Nonbleeding, nonthrombosed external and internal hemorrhoids were noted.  Pathology from resected polyps showed benign findings.  On her follow-up visit with [place] on 01/04/2024, hemoglobin was much better at 13.9, MCV now normal at 88.9.  Platelet count also improved to 424,000.  Experiencing constipation and nausea with current iron supplementation with Laurey Morale. - Switch to Acrrufer to reduce constipation and nausea. Start 30 mg once daily and increase to twice daily as tolerated. - Continue current iron supplementation until Accrufer is approved and available. - Encourage dietary fiber intake to manage constipation. - Consider Dulcolax or Miralax for constipation management.  Thrombocytosis Platelet counts decreased from 600,000 in January to 424,000, now within normal range, indicating improvement.  Previous thrombocytosis was reactionary to iron deficiency.  - Schedule follow-up appointment in three months.  I spent a total of 20 minutes during  this encounter  with the patient including review of chart and various tests results, discussions about plan of care and coordination of care plan.  I reviewed lab results and outside records for this visit and discussed relevant results with the patient. Diagnosis, plan of care and treatment options were also discussed in detail with the patient. Opportunity provided to ask questions and answers provided to her apparent satisfaction. Provided instructions to call our clinic with any problems, questions or concerns prior to return visit. I recommended to continue follow-up with PCP and sub-specialists. She verbalized understanding and agreed with the plan. No barriers to learning was detected.  Meryl Crutch, MD  01/04/2024 11:44 AM  [facility] - A DE[facility] OF [facility] [address] [place] [address] Dept: [phone] Dept Fax: [phone]   CHIEF COMPLAINT/ REASON FOR VISIT:  Iron deficiency anemia from chronic blood loss, presumed GI origin.  INTERVAL HISTORY:  Discussed the use of AI scribe software for clinical note transcription with the patient, who gave verbal consent to proceed.  History of Present Illness [name] is a 63 year old female with iron deficiency anemia who presents for follow-up after IV iron treatment.  Following the IV iron infusion, she experienced significant nausea and a lack of appetite, unable to tolerate the smell of food and did not eat for several days. No vomiting occurred, but the nausea was severe.  She is currently taking an oral iron supplement, Fergon, every other day for a couple of weeks. She experiences constipation, which she attributes to the iron supplementation. She uses Miralax to manage her bowel movements but finds it ineffective. She is also attempting to increase her fiber intake through diet and supplements.  Her hemoglobin levels have improved significantly from 6.0 in  January to 13.9 currently. Her platelet count has also normalized from 600,000 in January to 424,000.  She has a history of gastrointestinal bleeding in the duodenum, identified as a source of her iron deficiency. She underwent blood transfusions in the past to manage her anemia.  She has a history of pancreatitis. Polyps removed from her colon were benign.   SUMMARY OF HEMATOLOGIC HISTORY:  On 10/04/2023, labs showed hemoglobin of 6.5, hematocrit 23.6, MCV 66.  White count 8500 with normal differential, platelet count elevated at 566,000.   She reports a significant drop in hemoglobin levels, which was discovered during routine blood work in early January 2025.  The patient denied any noticeable blood loss but mentions a history of light vaginal bleeding, which has occurred twice and lasted for about two to three days. She also reports occasional black stools during episodes of diarrhea and constipation, which she attributes to iron supplementation.   In addition to anemia, the patient has been experiencing a rash on her back and shoulders for about two weeks. The rash is itchy and has not responded to topical Benadryl cream. The patient reports no changes in soap, detergent, or moisturizer use. She also mentions taking tramadol for pain and has recently started taking hydrocodone.   The patient's energy levels seem to be relatively stable despite the low hemoglobin levels. She expresses frustration and concern about the cause of her anemia and the fluctuating hemoglobin levels.   She is on chronic anticoagulation with Xarelto for her history of A-fib and cardiac issues.   No obvious source of significant blood loss identified.    Possible microscopic gastrointestinal bleeding, differential includes  AVMs and other vascular malformations.    She did receive 2 units of PRBC transfusion on 10/04/2023.  On initial consultation with [place] on 10/26/2023, labs revealed hemoglobin of 10, hematocrit 34.6, MCV  71.4.  White count 11,000 with normal differential.  Platelet count elevated at 535,000, likely reactionary from iron deficiency.  Iron studies continued to show evidence of iron deficiency with iron saturation of 6%, iron decreased at 21.  Ferritin decreased at 10.  B12, folate, LDH, reticulocyte count were all within normal limits.  Coombs test negative.   Discussed IV iron infusion options and potential need for repeat infusions if ongoing blood loss is identified.    We got approval for Venofer and she was given Venofer 200 mg weekly x 5 doses at our infusion center at [facility].  On 12/25/2023 she underwent EGD and colonoscopy.  EGD showed LA grade A esophagitis but no other gross lesions in the esophagus.  Evidence of gastritis was noted.  3 angioectasias were noted in the duodenum, treated with argon plasma coagulation.  Colonoscopy showed seven 2 to 6 mm polyps at the rectosigmoid colon, descending colon, ascending colon and cecum.  Diverticulosis in the rectosigmoid colon and sigmoid colon.  Nonbleeding, nonthrombosed external and internal hemorrhoids were noted.  Pathology from resected polyps showed benign findings.  On her follow-up visit with [place] on 01/04/2024, hemoglobin was much better at 13.9, MCV now normal at 88.9.  Platelet count also improved to 424,000.  I have reviewed the past medical history, past surgical history, social history and family history with the patient and they are unchanged from previous note.  ALLERGIES: She is allergic to hydromorphone, lopressor [metoprolol tartrate], sacubitril-valsartan, allegra allergy [fexofenadine hcl], fexofenadine, lasix [furosemide], nsaids, gabapentin, gabapentin (once-daily), and lipitor [atorvastatin].  MEDICATIONS:  Current Outpatient Medications  Medication Sig Dispense Refill   Cyanocobalamin (VITAMIN B-12 CR PO) Take by mouth.     Ferric Maltol (ACCRUFER) 30 MG CAPS Take 1 capsule (30 mg total) by mouth 2 (two) times daily. 60  capsule 5   ferrous gluconate (FERGON) 324 MG tablet Take 1 tablet (324 mg total) by mouth every other day. 30 tablet 3   FOLIC ACID PO Take by mouth.     levocetirizine (XYZAL) 5 MG tablet Take 1 tablet (5 mg total) by mouth every evening. 90 tablet 3   omeprazole (PRILOSEC) 40 MG capsule Take 1 capsule (40 mg total) by mouth daily. 60 capsule 6   pravastatin (PRAVACHOL) 40 MG tablet TAKE 1 TABLET BY MOUTH ONCE DAILY IN THE EVENING 15 tablet 0   rivaroxaban (XARELTO) 20 MG TABS tablet Take 1 tablet (20 mg total) by mouth daily with supper. 14 tablet 0   traMADol (ULTRAM) 50 MG tablet Take 1 tablet (50 mg total) by mouth every 6 (six) hours. 120 tablet 5   No current facility-administered medications for this visit.     REVIEW OF SYSTEMS:    Review of Systems - Oncology  All other pertinent systems were reviewed with the patient and are negative.  PHYSICAL EXAMINATION:   Onc Performance Status - 01/04/24 1108       ECOG Perf Status   ECOG Perf Status Restricted in physically strenuous activity but ambulatory and able to carry out work of a light or sedentary nature, e.g., light house work, office work      KPS SCALE   KPS % SCORE Cares for self, unable to carry on normal activity or to do active work  Vitals:   01/04/24 1054  BP: 118/68  Pulse: (!) 109  Resp: 18  Temp: 97.7 F (36.5 C)  SpO2: 100%   Filed Weights   01/04/24 1054  Weight: 118 lb 8 oz (53.8 kg)    Physical Exam Constitutional:      General: She is not in acute distress.    Appearance: Normal appearance.  HENT:     Head: Normocephalic and atraumatic.  Eyes:     General: No scleral icterus.    Conjunctiva/sclera: Conjunctivae normal.  Cardiovascular:     Rate and Rhythm: Normal rate and regular rhythm.  Pulmonary:     Effort: Pulmonary effort is normal.  Abdominal:     General: There is no distension.  Musculoskeletal:     Right lower leg: No edema.     Left lower leg: No  edema.  Neurological:     General: No focal deficit present.     Mental Status: She is alert and oriented to person, place, and time.  Psychiatric:        Mood and Affect: Mood normal.        Behavior: Behavior normal.        Thought Content: Thought content normal.     LABORATORY DATA:   I have reviewed the data as listed.  Results for orders placed or performed in visit on 01/04/24  Iron and Iron Binding Capacity ([facility] only)  Result Value Ref Range   Iron 34 28 - 170 ug/dL   TIBC 161 096 - 045 ug/dL   Saturation Ratios 12 10.4 - 31.8 %   UIBC 250 148 - 442 ug/dL  CBC with Differential (Cancer Center Only)  Result Value Ref Range   WBC Count 10.2 4.0 - 10.5 K/uL   RBC 4.76 3.87 - 5.11 MIL/uL   Hemoglobin 13.9 12.0 - 15.0 g/dL   HCT 40.9 81.1 - 91.4 %   MCV 88.9 80.0 - 100.0 fL   MCH 29.2 26.0 - 34.0 pg   MCHC 32.9 30.0 - 36.0 g/dL   RDW 78.2 (H) 95.6 - 21.3 %   Platelet Count 424 (H) 150 - 400 K/uL   nRBC 0.0 0.0 - 0.2 %   Neutrophils Relative % 70 %   Neutro Abs 7.2 1.7 - 7.7 K/uL   Lymphocytes Relative 18 %   Lymphs Abs 1.8 0.7 - 4.0 K/uL   Monocytes Relative 10 %   Monocytes Absolute 1.0 0.1 - 1.0 K/uL   Eosinophils Relative 1 %   Eosinophils Absolute 0.1 0.0 - 0.5 K/uL   Basophils Relative 1 %   Basophils Absolute 0.1 0.0 - 0.1 K/uL   Immature Granulocytes 0 %   Abs Immature Granulocytes 0.02 0.00 - 0.07 K/uL    RADIOGRAPHIC STUDIES:  No recent pertinent imaging studies available to review.  Orders Placed This Encounter  Procedures   CBC with Differential (Cancer Center Only)    Standing Status:   Future    Expected Date:   04/11/2024    Expiration Date:   01/03/2025   Iron and Iron Binding Capacity ([facility] only)    Standing Status:   Future    Expected Date:   04/11/2024    Expiration Date:   01/03/2025   Ferritin    Standing Status:   Future    Expected Date:   04/11/2024    Expiration Date:   01/03/2025     Future Appointments  Date Time  Provider Department Center  01/29/2024 11:00 AM  Carlus Pavlov, MD [facility] None  02/09/2024 10:30 AM [facility] 2 [facility] Pattricia Boss [facility]  04/15/2024 10:35 AM Jerene Bears, MD [facility]     This document was completed utilizing speech recognition software. Grammatical errors, random word insertions, pronoun errors, and incomplete sentences are an occasional consequence of this system due to software limitations, ambient noise, and hardware issues. Any formal questions or concerns about the content, text or information contained within the body of this dictation should be directly addressed to the provider for clarification.

## 2024-01-04 NOTE — Assessment & Plan Note (Addendum)
 Chronic iron deficiency anemia with recent hemoglobin drop to 6.5 g/dL on 09/29/863. No obvious source of significant blood loss identified.   Possible microscopic gastrointestinal bleeding, differential includes AVMs and other vascular malformations.  She is on chronic anticoagulation with Xarelto which could also be contributing.  She did receive 2 units of PRBC transfusion on 10/04/2023.   On initial consultation with [place] on 10/26/2023, labs revealed hemoglobin of 10, hematocrit 34.6, MCV 71.4.  White count 11,000 with normal differential.  Platelet count elevated at 535,000, likely reactionary from iron deficiency.  Iron studies continued to show evidence of iron deficiency with iron saturation of 6%, iron decreased at 21.  Ferritin decreased at 10.  B12, folate, LDH, reticulocyte count were all within normal limits.  Coombs test negative.   Discussed IV iron infusion options and potential need for repeat infusions if ongoing blood loss is identified.    We got approval for Venofer and she was given Venofer 200 mg weekly x 5 doses at our infusion center at [facility].  On 12/25/2023 she underwent EGD and colonoscopy.  EGD showed LA grade A esophagitis but no other gross lesions in the esophagus.  Evidence of gastritis was noted.  3 angioectasias were noted in the duodenum, treated with argon plasma coagulation.  Colonoscopy showed seven 2 to 6 mm polyps at the rectosigmoid colon, descending colon, ascending colon and cecum.  Diverticulosis in the rectosigmoid colon and sigmoid colon.  Nonbleeding, nonthrombosed external and internal hemorrhoids were noted.  Pathology from resected polyps showed benign findings.  On her follow-up visit with [place] on 01/04/2024, hemoglobin was much better at 13.9, MCV now normal at 88.9.  Platelet count also improved to 424,000.  Experiencing constipation and nausea with current iron supplementation with Laurey Morale. - Switch to Acrrufer to reduce constipation and nausea.  Start 30 mg once daily and increase to twice daily as tolerated. - Continue current iron supplementation until Accrufer is approved and available. - Encourage dietary fiber intake to manage constipation. - Consider Dulcolax or Miralax for constipation management.

## 2024-01-04 NOTE — Assessment & Plan Note (Signed)
 Platelet counts decreased from 600,000 in January to 424,000, now within normal range, indicating improvement.  Previous thrombocytosis was reactionary to iron deficiency.

## 2024-01-05 ENCOUNTER — Encounter: Payer: Self-pay | Admitting: Oncology

## 2024-01-15 ENCOUNTER — Other Ambulatory Visit: Payer: Self-pay | Admitting: Cardiology

## 2024-01-16 ENCOUNTER — Telehealth: Payer: Self-pay | Admitting: Gastroenterology

## 2024-01-16 NOTE — Telephone Encounter (Signed)
 Inbound call from patient requesting to speak with the nurse regarding her iron  intake and her bowel movements. Please advise.

## 2024-01-16 NOTE — Telephone Encounter (Signed)
 Inbound call from patient's husband, states the iron  is causing extreme constipation. He states that they have tried dulcolax and that her stomach is bloated. He states there is another iron  pill that does not cause constipation. Would like to speak to someone urgently.

## 2024-01-16 NOTE — Telephone Encounter (Signed)
 I spoke with the pt and discussed the constipation while taking Iron .  She is going to begin miralax  daily as needed to maintain bowel habits.  She is also going to call the oncologist to get recent lab results and discuss if she needs to continue taking iron .  The pt has been advised of the information and verbalized understanding.

## 2024-01-29 ENCOUNTER — Encounter: Payer: Self-pay | Admitting: Internal Medicine

## 2024-01-29 ENCOUNTER — Other Ambulatory Visit: Payer: Self-pay | Admitting: Cardiology

## 2024-01-29 ENCOUNTER — Ambulatory Visit (INDEPENDENT_AMBULATORY_CARE_PROVIDER_SITE_OTHER): Payer: 59 | Admitting: Internal Medicine

## 2024-01-29 VITALS — BP 120/70 | HR 95 | Ht 60.0 in | Wt 117.0 lb

## 2024-01-29 DIAGNOSIS — E063 Autoimmune thyroiditis: Secondary | ICD-10-CM | POA: Diagnosis not present
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
## 2024-01-29 NOTE — Patient Instructions (Signed)
Please stop at the lab.  In case we need to start Levothyroxine, take the thyroid hormone every day, with water, at least 30 minutes before breakfast, separated by at least 4 hours from: - acid reflux medications - calcium - iron - multivitamins  Please come back for a follow-up appointment in 1 year.

## 2024-01-30 LAB — T3, FREE: T3, Free: 3 pg/mL (ref 2.3–4.2)

## 2024-01-30 LAB — T4, FREE: Free T4: 1 ng/dL (ref 0.8–1.8)

## 2024-01-30 LAB — TSH: TSH: 2.68 m[IU]/L (ref 0.40–4.50)

## 2024-02-02 ENCOUNTER — Other Ambulatory Visit: Payer: Self-pay | Admitting: Student

## 2024-02-02 DIAGNOSIS — I480 Paroxysmal atrial fibrillation: Secondary | ICD-10-CM

## 2024-02-02 NOTE — Telephone Encounter (Signed)
 Xarelto  20mg  refill request received. Pt is 63 years old, weight-53.1kg, Crea-0.73 on 10/26/23, last seen by Dr. Lawana Pray on 03/22/23, Diagnosis-Afib, CrCl- 66.12 mL/min; Dose is appropriate based on dosing criteria. Will send in refill to requested pharmacy.

## 2024-02-09 ENCOUNTER — Ambulatory Visit: Payer: Self-pay | Admitting: Nurse Practitioner

## 2024-02-09 ENCOUNTER — Ambulatory Visit (HOSPITAL_COMMUNITY)
Admission: RE | Admit: 2024-02-09 | Discharge: 2024-02-09 | Disposition: A | Discharge status: Home / Self Care | Source: Ambulatory Visit | Attending: Cardiology | Admitting: Cardiology

## 2024-02-09 DIAGNOSIS — I6523 Occlusion and stenosis of bilateral carotid arteries: Secondary | ICD-10-CM | POA: Diagnosis present

## 2024-02-23 ENCOUNTER — Other Ambulatory Visit: Payer: Self-pay | Admitting: Cardiology

## 2024-03-26 ENCOUNTER — Telehealth: Payer: Self-pay | Admitting: Cardiology

## 2024-03-26 MED ORDER — PRAVASTATIN SODIUM 40 MG PO TABS: 40.0000 mg | ORAL_TABLET | Freq: Every evening | ORAL | 0 refills | Status: DC

## 2024-03-26 NOTE — Telephone Encounter (Signed)
 Pt's medication was sent to pt's pharmacy as requested. Confirmation received.

## 2024-03-26 NOTE — Telephone Encounter (Signed)
*  STAT* If patient is at the pharmacy, call can be transferred to refill team.   1. Which medications need to be refilled? (please list name of each medication and dose if known)   pravastatin  (PRAVACHOL ) 40 MG tablet     4. Which pharmacy/location (including street and city if local pharmacy) is medication to be sent to? [id number] - [place], [place] - [address]     5. Do they need a 30 day or 90 day supply? 90: Pt states insurance wont cover unless 90 days, she is scheduled for  7/8

## 2024-04-02 ENCOUNTER — Other Ambulatory Visit: Payer: Self-pay | Admitting: *Deleted

## 2024-04-02 ENCOUNTER — Ambulatory Visit: Attending: Student | Admitting: Student

## 2024-04-02 ENCOUNTER — Encounter: Payer: Self-pay | Admitting: Student

## 2024-04-02 VITALS — BP 122/64 | HR 88 | Ht 60.0 in | Wt 123.0 lb

## 2024-04-02 DIAGNOSIS — I480 Paroxysmal atrial fibrillation: Secondary | ICD-10-CM

## 2024-04-02 DIAGNOSIS — I5022 Chronic systolic (congestive) heart failure: Secondary | ICD-10-CM

## 2024-04-02 DIAGNOSIS — I493 Ventricular premature depolarization: Secondary | ICD-10-CM | POA: Diagnosis not present

## 2024-04-02 DIAGNOSIS — I2510 Atherosclerotic heart disease of native coronary artery without angina pectoris: Secondary | ICD-10-CM

## 2024-04-02 DIAGNOSIS — I6523 Occlusion and stenosis of bilateral carotid arteries: Secondary | ICD-10-CM

## 2024-04-02 DIAGNOSIS — E785 Hyperlipidemia, unspecified: Secondary | ICD-10-CM

## 2024-04-02 MED ORDER — PRAVASTATIN SODIUM 80 MG PO TABS: 80.0000 mg | ORAL_TABLET | Freq: Every evening | ORAL | 3 refills | Status: AC

## 2024-04-02 MED ORDER — RIVAROXABAN 20 MG PO TABS: 20.0000 mg | ORAL_TABLET | Freq: Every day | ORAL | 5 refills | Status: DC

## 2024-04-02 MED ORDER — MEXILETINE HCL 250 MG PO CAPS: 250.0000 mg | ORAL_CAPSULE | Freq: Two times a day (BID) | ORAL | 3 refills | Status: DC
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
## 2024-04-02 NOTE — Patient Instructions (Addendum)
 Medication Instructions:   START Mexiletine 250 mg Twice a day   INCREASE Pravastatin  to 80 mg daily   Labwork: Fasting lipids and LFT's in 2-3 months  Testing/Procedures: None today  Follow-Up:  Dr.Camnitz in 2-3 months   Dr.McDowell in 4-5 months  Any Other Special Instructions Will Be Listed Below (If Applicable).  If you need a refill on your cardiac medications before your next appointment, please call your pharmacy.

## 2024-04-02 NOTE — Telephone Encounter (Signed)
 Prescription refill request for Xarelto  received.  Indication: PAF Last office visit: 04/02/24  B Strader PA-C Weight: 55.8kg Age: 63 Scr: 0.73 on 10/26/23  Epic CrCl: 69.48  Based on above findings Xarelto  20mg  daily is the appropriate dose.  Refill approved.

## 2024-04-04 ENCOUNTER — Inpatient Hospital Stay: Attending: Oncology

## 2024-04-04 ENCOUNTER — Inpatient Hospital Stay (HOSPITAL_BASED_OUTPATIENT_CLINIC_OR_DEPARTMENT_OTHER): Admitting: Oncology

## 2024-04-04 VITALS — BP 137/79 | HR 91 | Temp 97.8°F | Resp 17 | Wt 123.6 lb

## 2024-04-04 DIAGNOSIS — Z8673 Personal history of transient ischemic attack (TIA), and cerebral infarction without residual deficits: Secondary | ICD-10-CM | POA: Diagnosis not present

## 2024-04-04 DIAGNOSIS — Z8719 Personal history of other diseases of the digestive system: Secondary | ICD-10-CM | POA: Diagnosis not present

## 2024-04-04 DIAGNOSIS — K5730 Diverticulosis of large intestine without perforation or abscess without bleeding: Secondary | ICD-10-CM | POA: Insufficient documentation

## 2024-04-04 DIAGNOSIS — D75839 Thrombocytosis, unspecified: Secondary | ICD-10-CM

## 2024-04-04 DIAGNOSIS — Z951 Presence of aortocoronary bypass graft: Secondary | ICD-10-CM | POA: Insufficient documentation

## 2024-04-04 DIAGNOSIS — Z7901 Long term (current) use of anticoagulants: Secondary | ICD-10-CM | POA: Diagnosis not present

## 2024-04-04 DIAGNOSIS — K2091 Esophagitis, unspecified with bleeding: Secondary | ICD-10-CM | POA: Insufficient documentation

## 2024-04-04 DIAGNOSIS — I4891 Unspecified atrial fibrillation: Secondary | ICD-10-CM | POA: Insufficient documentation

## 2024-04-04 DIAGNOSIS — Z803 Family history of malignant neoplasm of breast: Secondary | ICD-10-CM | POA: Diagnosis not present

## 2024-04-04 DIAGNOSIS — D500 Iron deficiency anemia secondary to blood loss (chronic): Secondary | ICD-10-CM

## 2024-04-04 DIAGNOSIS — D509 Iron deficiency anemia, unspecified: Secondary | ICD-10-CM | POA: Diagnosis present

## 2024-04-04 DIAGNOSIS — Z800 Family history of malignant neoplasm of digestive organs: Secondary | ICD-10-CM | POA: Insufficient documentation

## 2024-04-04 DIAGNOSIS — R21 Rash and other nonspecific skin eruption: Secondary | ICD-10-CM | POA: Insufficient documentation

## 2024-04-04 LAB — IRON AND IRON BINDING CAPACITY (CC-WL,HP ONLY)
Iron: 21 ug/dL — ABNORMAL LOW (ref 28–170)
Saturation Ratios: 6 % — ABNORMAL LOW (ref 10.4–31.8)
TIBC: 346 ug/dL (ref 250–450)
UIBC: 325 ug/dL (ref 148–442)

## 2024-04-04 LAB — CBC WITH DIFFERENTIAL (CANCER CENTER ONLY)
Abs Immature Granulocytes: 0.02 K/uL (ref 0.00–0.07)
Basophils Absolute: 0.1 K/uL (ref 0.0–0.1)
Basophils Relative: 1 %
Eosinophils Absolute: 0.3 K/uL (ref 0.0–0.5)
Eosinophils Relative: 3 %
HCT: 31.6 % — ABNORMAL LOW (ref 36.0–46.0)
Hemoglobin: 9.9 g/dL — ABNORMAL LOW (ref 12.0–15.0)
Immature Granulocytes: 0 %
Lymphocytes Relative: 29 %
Lymphs Abs: 2.6 K/uL (ref 0.7–4.0)
MCH: 27.2 pg (ref 26.0–34.0)
MCHC: 31.3 g/dL (ref 30.0–36.0)
MCV: 86.8 fL (ref 80.0–100.0)
Monocytes Absolute: 1 K/uL (ref 0.1–1.0)
Monocytes Relative: 11 %
Neutro Abs: 5 K/uL (ref 1.7–7.7)
Neutrophils Relative %: 56 %
Platelet Count: 450 K/uL — ABNORMAL HIGH (ref 150–400)
RBC: 3.64 MIL/uL — ABNORMAL LOW (ref 3.87–5.11)
RDW: 14.5 % (ref 11.5–15.5)
WBC Count: 9.1 K/uL (ref 4.0–10.5)
nRBC: 0 % (ref 0.0–0.2)

## 2024-04-04 LAB — FERRITIN: Ferritin: 16 ng/mL (ref 11–307)

## 2024-04-04 NOTE — Assessment & Plan Note (Addendum)
 Chronic iron  deficiency anemia with recent hemoglobin drop to 6.5 g/dL on 04/26/7973. No obvious source of significant blood loss identified.   Possible microscopic gastrointestinal bleeding, differential includes AVMs and other vascular malformations.  She is on chronic anticoagulation with Xarelto  which could also be contributing.  She did receive 2 units of PRBC transfusion on 10/04/2023.   On initial consultation with us  on 10/26/2023, labs revealed hemoglobin of 10, hematocrit 34.6, MCV 71.4.  White count 11,000 with normal differential.  Platelet count elevated at 535,000, likely reactionary from iron  deficiency.  Iron  studies continued to show evidence of iron  deficiency with iron  saturation of 6%, iron  decreased at 21.  Ferritin decreased at 10.  B12, folate, LDH, reticulocyte count were all within normal limits.  Coombs test negative.   Discussed IV iron  infusion options and potential need for repeat infusions if ongoing blood loss is identified.    We got approval for Venofer  and she was given Venofer  200 mg weekly x 5 doses at our infusion center at [facility].  On 12/25/2023 she underwent EGD and colonoscopy.  EGD showed LA grade A esophagitis but no other gross lesions in the esophagus.  Evidence of gastritis was noted.  3 angioectasias were noted in the duodenum, treated with argon plasma coagulation.  Colonoscopy showed seven 2 to 6 mm polyps at the rectosigmoid colon, descending colon, ascending colon and cecum.  Diverticulosis in the rectosigmoid colon and sigmoid colon.  Nonbleeding, nonthrombosed external and internal hemorrhoids were noted.  Pathology from resected polyps showed benign findings.  On her follow-up visit with us  on 01/04/2024, hemoglobin was much better at 13.9, MCV now normal at 88.9.  Platelet count also improved to 424,000.  Since she was experiencing constipation and nausea with Fergon, we switched her to Accrufer  after last visit.  However patient apparently  experienced constipation with this as well and has not been taking it.  Labs today showed significant drop in hemoglobin from 13.9-9.9 over the last 3 months and iron  saturation is decreased at 6%, ferritin borderline low at 16.  Patient would like to defer IV iron  at this time and would like to try Accrufer  at least twice a week.  I encouraged her to try to take it at least every other day and use stool softeners.  Constipation is considered to be less likely with Accrufer  but patient is experiencing this side effect unfortunately.  - Encourage dietary fiber intake to manage constipation. - Consider Dulcolax or Miralax  for constipation management.  RTC in 3 months for reevaluation, sooner if progressive symptoms from anemia.

## 2024-04-04 NOTE — Progress Notes (Signed)
 [facility]  HEMATOLOGY CLINIC PROGRESS NOTE  PATIENT NAME: [name]   MR#: [id number] DOB: 1961-05-23  Patient Care Team: Catherine Charlies LABOR, DO as PCP - General (Family Medicine) [name], MD as PCP - Cardiology (Cardiology) Jerri Pfeiffer, MD as Consulting Physician (Neurology) Tobie Burgess [place], MD as Consulting Physician (Physical Medicine and Rehabilitation) Dolphus Carrion, MD as Consulting Physician (Interventional Radiology) Mansouraty, Aloha Raddle., MD as Consulting Physician (Gastroenterology) Eletha Boas, MD as Consulting Physician (General Surgery) [name], MD as Consulting Physician (Urology) Inocencio Soyla Lunger, MD as Consulting Physician (Cardiology) Johnson Laymon CHRISTELLA RIGGERS as Physician Assistant (Cardiology)  Date of visit: 04/04/2024   ASSESSMENT & PLAN:   [name] is a 1 y.o. lady with a past medical history of CVA, atrial fibrillation (on anticoagulation), CAD (status post CABG in Feb 2019), cardiomyopathy, recurrent idiopathic acute pancreatitis (status post ERCP with CBD and PD sphincterotomies for equivocal SOD findings on manometry with recurrent PD stenosis), status post cholecystectomy, pancreatic cyst, GERD, colon polyps (TAs) , was referred to our service in January 2025 for evaluation of microcytic, hypochromic anemia.  Presumed to be occult GI blood loss.  Iron  deficiency anemia due to chronic blood loss Chronic iron  deficiency anemia with recent hemoglobin drop to 6.5 g/dL on 04/26/7973. No obvious source of significant blood loss identified.   Possible microscopic gastrointestinal bleeding, differential includes AVMs and other vascular malformations.  She is on chronic anticoagulation with Xarelto  which could also be contributing.  She did receive 2 units of PRBC transfusion on 10/04/2023.   On initial consultation with us  on 10/26/2023, labs revealed hemoglobin of 10, hematocrit 34.6, MCV 71.4.  White  count 11,000 with normal differential.  Platelet count elevated at 535,000, likely reactionary from iron  deficiency.  Iron  studies continued to show evidence of iron  deficiency with iron  saturation of 6%, iron  decreased at 21.  Ferritin decreased at 10.  B12, folate, LDH, reticulocyte count were all within normal limits.  Coombs test negative.   Discussed IV iron  infusion options and potential need for repeat infusions if ongoing blood loss is identified.    We got approval for Venofer  and she was given Venofer  200 mg weekly x 5 doses at our infusion center at [facility].  On 12/25/2023 she underwent EGD and colonoscopy.  EGD showed LA grade A esophagitis but no other gross lesions in the esophagus.  Evidence of gastritis was noted.  3 angioectasias were noted in the duodenum, treated with argon plasma coagulation.  Colonoscopy showed seven 2 to 6 mm polyps at the rectosigmoid colon, descending colon, ascending colon and cecum.  Diverticulosis in the rectosigmoid colon and sigmoid colon.  Nonbleeding, nonthrombosed external and internal hemorrhoids were noted.  Pathology from resected polyps showed benign findings.  On her follow-up visit with us  on 01/04/2024, hemoglobin was much better at 13.9, MCV now normal at 88.9.  Platelet count also improved to 424,000.  Since she was experiencing constipation and nausea with Fergon, we switched her to Accrufer  after last visit.  However patient apparently experienced constipation with this as well and has not been taking it.  Labs today showed significant drop in hemoglobin from 13.9-9.9 over the last 3 months and iron  saturation is decreased at 6%, ferritin borderline low at 16.  Patient would like to defer IV iron  at this time and would like to try Accrufer  at least twice a week.  I encouraged her to try to take it  at least every other day and use stool softeners.  Constipation is considered to be less likely with Accrufer  but patient is experiencing this side  effect unfortunately.  - Encourage dietary fiber intake to manage constipation. - Consider Dulcolax or Miralax  for constipation management.  RTC in 3 months for reevaluation, sooner if progressive symptoms from anemia.  Thrombocytosis Platelet counts decreased from 600,000 in January to 450,000  Thrombocytosis is reactionary to iron  deficiency.   I spent a total of 20 minutes during this encounter with the patient including review of chart and various tests results, discussions about plan of care and coordination of care plan.  I reviewed lab results and outside records for this visit and discussed relevant results with the patient. Diagnosis, plan of care and treatment options were also discussed in detail with the patient. Opportunity provided to ask questions and answers provided to her apparent satisfaction. Provided instructions to call our clinic with any problems, questions or concerns prior to return visit. I recommended to continue follow-up with PCP and sub-specialists. She verbalized understanding and agreed with the plan. No barriers to learning was detected.  Chinita Patten, MD  04/04/2024 11:30 AM  [facility] - A DEPT [facility] [address] [place] [address] Dept: [phone] Dept Fax: [phone]   CHIEF COMPLAINT/ REASON FOR VISIT:  Iron  deficiency anemia from chronic blood loss, presumed GI origin.  INTERVAL HISTORY:  Discussed the use of AI scribe software for clinical note transcription with the patient, who gave verbal consent to proceed.  History of Present Illness  [name] is a 63 year old female with iron  deficiency anemia who presents for follow-up of her iron  levels.  She has a history of iron  deficiency anemia, previously requiring IV iron  infusions. Initially, she received five doses of IV iron , which improved her hemoglobin levels. However, her hemoglobin has  recently decreased from 13.9 g/dL to 9.9 g/dL over the past three months.  She was prescribed Accrufer , an oral iron  supplement, but experienced significant constipation after taking it for four to five days. This led her to use laxatives like Docolax and Senokot to manage her bowel movements. She also tried Miralax , which caused a lack of control over her bowel movements, necessitating her to stay home.  Her iron  saturation is currently at 6%. She is concerned about maintaining her iron  levels without experiencing severe constipation. She mentions a history of pancreas problems, which she believes may contribute to her unique reaction to the iron  supplement.  She feels tired but has no shortness of breath or chest pain.   SUMMARY OF HEMATOLOGIC HISTORY:  On 10/04/2023, labs showed hemoglobin of 6.5, hematocrit 23.6, MCV 66.  White count 8500 with normal differential, platelet count elevated at 566,000.   She reports a significant drop in hemoglobin levels, which was discovered during routine blood work in early January 2025.  The patient denied any noticeable blood loss but mentions a history of light vaginal bleeding, which has occurred twice and lasted for about two to three days. She also reports occasional black stools during episodes of diarrhea and constipation, which she attributes to iron  supplementation.   In addition to anemia, the patient has been experiencing a rash on her back and shoulders for about two weeks. The rash is itchy and has not responded to topical Benadryl  cream. The patient reports no changes in soap, detergent, or moisturizer use. She also mentions  taking tramadol  for pain and has recently started taking hydrocodone .   The patient's energy levels seem to be relatively stable despite the low hemoglobin levels. She expresses frustration and concern about the cause of her anemia and the fluctuating hemoglobin levels.   She is on chronic anticoagulation with Xarelto  for her  history of A-fib and cardiac issues.   No obvious source of significant blood loss identified.    Possible microscopic gastrointestinal bleeding, differential includes AVMs and other vascular malformations.    She did receive 2 units of PRBC transfusion on 10/04/2023.  On initial consultation with us  on 10/26/2023, labs revealed hemoglobin of 10, hematocrit 34.6, MCV 71.4.  White count 11,000 with normal differential.  Platelet count elevated at 535,000, likely reactionary from iron  deficiency.  Iron  studies continued to show evidence of iron  deficiency with iron  saturation of 6%, iron  decreased at 21.  Ferritin decreased at 10.  B12, folate, LDH, reticulocyte count were all within normal limits.  Coombs test negative.   Discussed IV iron  infusion options and potential need for repeat infusions if ongoing blood loss is identified.    We got approval for Venofer  and she was given Venofer  200 mg weekly x 5 doses at our infusion center at [facility].  On 12/25/2023 she underwent EGD and colonoscopy.  EGD showed LA grade A esophagitis but no other gross lesions in the esophagus.  Evidence of gastritis was noted.  3 angioectasias were noted in the duodenum, treated with argon plasma coagulation.  Colonoscopy showed seven 2 to 6 mm polyps at the rectosigmoid colon, descending colon, ascending colon and cecum.  Diverticulosis in the rectosigmoid colon and sigmoid colon.  Nonbleeding, nonthrombosed external and internal hemorrhoids were noted.  Pathology from resected polyps showed benign findings.  On her follow-up visit with us  on 01/04/2024, hemoglobin was much better at 13.9, MCV now normal at 88.9.  Platelet count also improved to 424,000.  I have reviewed the past medical history, past surgical history, social history and family history with the patient and they are unchanged from previous note.  ALLERGIES: She is allergic to hydromorphone , lopressor  [metoprolol  tartrate], sacubitril -valsartan , allegra   allergy [fexofenadine  hcl], fexofenadine , lasix  [furosemide ], nsaids, gabapentin , gabapentin  (once-daily), and lipitor  [atorvastatin ].  MEDICATIONS:  Current Outpatient Medications  Medication Sig Dispense Refill   Cyanocobalamin  (VITAMIN B-12 CR PO) Take by mouth.     FOLIC ACID  PO Take by mouth.     levocetirizine (XYZAL ) 5 MG tablet Take 1 tablet (5 mg total) by mouth every evening. 90 tablet 3   omeprazole  (PRILOSEC) 40 MG capsule Take 1 capsule (40 mg total) by mouth daily. 60 capsule 6   pravastatin  (PRAVACHOL ) 80 MG tablet Take 1 tablet (80 mg total) by mouth every evening. 90 tablet 3   rivaroxaban  (XARELTO ) 20 MG TABS tablet Take 1 tablet (20 mg total) by mouth daily with supper. 30 tablet 5   traMADol  (ULTRAM ) 50 MG tablet Take 1 tablet (50 mg total) by mouth every 6 (six) hours. 120 tablet 5   triamcinolone  cream (KENALOG ) 0.1 % Apply 1 Application topically 2 (two) times daily.     mexiletine (MEXITIL) 250 MG capsule Take 1 capsule (250 mg total) by mouth 2 (two) times daily. (Patient not taking: Reported on 04/04/2024) 180 capsule 3   No current facility-administered medications for this visit.     REVIEW OF SYSTEMS:    Review of Systems - Oncology  All other pertinent systems were reviewed with the patient and are  negative.  PHYSICAL EXAMINATION:   Onc Performance Status - 04/04/24 1115       ECOG Perf Status   ECOG Perf Status Ambulatory and capable of all selfcare but unable to carry out any work activities.  Up and about more than 50% of waking hours      KPS SCALE   KPS % SCORE Cares for self, unable to carry on normal activity or to do active work          Vitals:   04/04/24 1108  BP: 137/79  Pulse: 91  Resp: 17  Temp: 97.8 F (36.6 C)  SpO2: 95%   Filed Weights   04/04/24 1108  Weight: 123 lb 9.6 oz (56.1 kg)    Physical Exam Constitutional:      General: She is not in acute distress.    Appearance: Normal appearance.  HENT:     Head:  Normocephalic and atraumatic.  Eyes:     General: No scleral icterus.    Conjunctiva/sclera: Conjunctivae normal.  Cardiovascular:     Rate and Rhythm: Normal rate and regular rhythm.  Pulmonary:     Effort: Pulmonary effort is normal.  Abdominal:     General: There is no distension.  Musculoskeletal:     Right lower leg: No edema.     Left lower leg: No edema.  Neurological:     General: No focal deficit present.     Mental Status: She is alert and oriented to person, place, and time.  Psychiatric:        Mood and Affect: Mood normal.        Behavior: Behavior normal.        Thought Content: Thought content normal.     LABORATORY DATA:   I have reviewed the data as listed.  Results for orders placed or performed in visit on 04/04/24  Ferritin  Result Value Ref Range   Ferritin 16 11 - 307 ng/mL  Iron  and Iron  Binding Capacity ([facility] only)  Result Value Ref Range   Iron  21 (L) 28 - 170 ug/dL   TIBC 653 749 - 549 ug/dL   Saturation Ratios 6 (L) 10.4 - 31.8 %   UIBC 325 148 - 442 ug/dL  CBC with Differential (Cancer Center Only)  Result Value Ref Range   WBC Count 9.1 4.0 - 10.5 K/uL   RBC 3.64 (L) 3.87 - 5.11 MIL/uL   Hemoglobin 9.9 (L) 12.0 - 15.0 g/dL   HCT 68.3 (L) 63.9 - 53.9 %   MCV 86.8 80.0 - 100.0 fL   MCH 27.2 26.0 - 34.0 pg   MCHC 31.3 30.0 - 36.0 g/dL   RDW 85.4 88.4 - 84.4 %   Platelet Count 450 (H) 150 - 400 K/uL   nRBC 0.0 0.0 - 0.2 %   Neutrophils Relative % 56 %   Neutro Abs 5.0 1.7 - 7.7 K/uL   Lymphocytes Relative 29 %   Lymphs Abs 2.6 0.7 - 4.0 K/uL   Monocytes Relative 11 %   Monocytes Absolute 1.0 0.1 - 1.0 K/uL   Eosinophils Relative 3 %   Eosinophils Absolute 0.3 0.0 - 0.5 K/uL   Basophils Relative 1 %   Basophils Absolute 0.1 0.0 - 0.1 K/uL   Immature Granulocytes 0 %   Abs Immature Granulocytes 0.02 0.00 - 0.07 K/uL    RADIOGRAPHIC STUDIES:  No recent pertinent imaging studies available to review.  No orders of the defined  types were placed in this encounter.  Future Appointments  Date Time Provider Department Center  04/15/2024 10:35 AM Cleotilde Ronal RAMAN, MD [facility]  06/26/2024 10:45 AM Inocencio Soyla Lunger, MD [facility]  07/03/2024 10:30 AM [facility] None  07/03/2024 11:00 AM Susette Seminara, Chinita, MD [facility] None  08/07/2024 11:00 AM Debera Jayson MATSU, MD [facility]  01/28/2025 11:00 AM Trixie File, MD [facility] None     This document was completed utilizing speech recognition software. Grammatical errors, random word insertions, pronoun errors, and incomplete sentences are an occasional consequence of this system due to software limitations, ambient noise, and hardware issues. Any formal questions or concerns about the content, text or information contained within the body of this dictation should be directly addressed to the provider for clarification.

## 2024-04-04 NOTE — Assessment & Plan Note (Addendum)
 Platelet counts decreased from 600,000 in January to 450,000  Thrombocytosis is reactionary to iron  deficiency.

## 2024-04-09 ENCOUNTER — Encounter: Payer: Self-pay | Admitting: Oncology

## 2024-04-11 ENCOUNTER — Telehealth: Payer: Self-pay | Admitting: Student

## 2024-04-11 NOTE — Telephone Encounter (Signed)
   It was previously recommended by EP for her to be on this for PVC's. By review of Clinical Key, up to 12% of patients can have a tremor with the medication and 1.7% reported arthritic pains. She is currently on Mexiletine 250 mg twice daily. Will route to Dr. Inocencio to see if he recommends a dose reduction or just holding the medication until she has follow-up with him in 06/2024 and can review alternatives at that time?  Signed, Laymon CHRISTELLA Qua, PA-C 04/11/2024, 2:50 PM Pager: [phone]

## 2024-04-11 NOTE — Telephone Encounter (Signed)
 Pt c/o medication issue:  1. Name of Medication: mexiletine (MEXITIL) 250 MG capsule   2. How are you currently taking this medication (dosage and times per day)? 250 mg, 2 times daily   3. Are you having a reaction (difficulty breathing--STAT)? no  4. What is your medication issue? Knee pain and swelling affecting walk and balance, numbness in hands. Requesting cb to discuss

## 2024-04-11 NOTE — Telephone Encounter (Signed)
   Please review prior note from earlier this afternoon with her.   Reviewed with Dr. Inocencio and he recommended that she hold it for now to see if symptoms improve. If they do not improve, would restart at the current dose. If improvement in symptoms, would reduce to 150 mg twice daily. She will need to let us  know as this mg dosing would need to be sent to her pharmacy.  Signed, Laymon CHRISTELLA Qua, PA-C 04/11/2024, 3:39 PM Pager: [phone]

## 2024-04-11 NOTE — Telephone Encounter (Signed)
 Spoke with pt and Dr. Inocencio recommendations given. Pt agrees with plan of care. She will call office in a week with symptoms.

## 2024-04-11 NOTE — Telephone Encounter (Signed)
 Pt states that she started taking Mexitil on 7/15 and she has started having knee and ankle pain and swelling and shaking that started the first night she she took medication. Pt states that she took Tramadol  and Tylenol  for the pain with little relief. Pt asking if this is a side effect. Has noticed some SOB but not much. She states that her knees normally hurt but are worse after starting Mexitil. Please advise.

## 2024-04-15 ENCOUNTER — Ambulatory Visit (INDEPENDENT_AMBULATORY_CARE_PROVIDER_SITE_OTHER): Payer: No Typology Code available for payment source | Admitting: Obstetrics & Gynecology

## 2024-04-15 VITALS — BP 142/78 | HR 99 | Wt 124.0 lb

## 2024-04-15 DIAGNOSIS — N901 Moderate vulvar dysplasia: Secondary | ICD-10-CM

## 2024-04-15 DIAGNOSIS — D071 Carcinoma in situ of vulva: Secondary | ICD-10-CM | POA: Diagnosis not present

## 2024-04-18 ENCOUNTER — Encounter (HOSPITAL_BASED_OUTPATIENT_CLINIC_OR_DEPARTMENT_OTHER): Payer: Self-pay | Admitting: Obstetrics & Gynecology

## 2024-04-18 NOTE — Progress Notes (Signed)
    63 y.o. G2P2 Married Not Hispanic or Latino female here for vulvar colposcopy with possible biopsies due to hx of excision of vulvar lesion 09/2021 showing VIN II - III with positive margins.  Was treated post excision with Aldara.  Lesion was originally flat but pink in appearance and was located on the inferior left labia majora.  Denies any itching or pain.  Denies bleeding  No LMP recorded (lmp unknown). Patient is postmenopausal.           Patient has been counseled about results and procedure.  Risks and benefits have bene reviewed including immediate and/or delayed bleeding, infection, scaring from procedure, possibility of needing additional follow up as well as treatment.  Rare risks of missing a lesion discussed as well.  All questions answered.  Pt ready to proceed.  Consent obtained.  BP (!) 142/78 (BP Location: Right Arm, Patient Position: Sitting, Cuff Size: Large)   Pulse 99   Wt 124 lb (56.2 kg)   LMP  (LMP Unknown) Comment: at age 60  SpO2 96%   BMI 24.22 kg/m   General appearance: alert, cooperative and appears stated age Lymph nodes: No abnormal inguinal nodes palpated Neurologic: Grossly normal  Pelvic: External genitalia:  no lesions              Urethra:  normal appearing urethra with no masses, tenderness or lesions              Bartholins and Skenes: normal                 Vagina: normal appearing vagina with normal color and no discharge, no lesions                Speculum placed.  3% acetic acid applied to vulva for 3 minutes.  Entire vulva was inspected with the colposcope using the 7.5x magnification.  No lesions noted.  No biopsies recommended.  Pt tolerated procedure well and all instruments were removed.    Chaperone was present during procedure.  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 Vulvar intraepithelial neoplasia (VIN) grade  3

## 2024-06-13 ENCOUNTER — Ambulatory Visit
Admission: RE | Admit: 2024-06-13 | Discharge: 2024-06-13 | Disposition: A | Discharge status: Home / Self Care | Source: Ambulatory Visit | Attending: Physical Medicine & Rehabilitation | Admitting: Physical Medicine & Rehabilitation

## 2024-06-13 ENCOUNTER — Encounter: Payer: Self-pay | Admitting: Physical Medicine & Rehabilitation

## 2024-06-13 ENCOUNTER — Encounter: Attending: Physical Medicine & Rehabilitation | Admitting: Physical Medicine & Rehabilitation

## 2024-06-13 VITALS — BP 130/79 | HR 92 | Ht 60.0 in | Wt 125.0 lb

## 2024-06-13 DIAGNOSIS — M25569 Pain in unspecified knee: Secondary | ICD-10-CM

## 2024-06-13 DIAGNOSIS — M1712 Unilateral primary osteoarthritis, left knee: Secondary | ICD-10-CM | POA: Insufficient documentation
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
## 2024-06-20 ENCOUNTER — Encounter: Payer: Self-pay | Admitting: Gastroenterology

## 2024-06-25 ENCOUNTER — Other Ambulatory Visit: Payer: Self-pay | Admitting: Family Medicine

## 2024-06-25 ENCOUNTER — Encounter: Payer: Self-pay | Admitting: Physical Medicine & Rehabilitation

## 2024-06-25 ENCOUNTER — Encounter: Admitting: Physical Medicine & Rehabilitation

## 2024-06-25 VITALS — BP 117/71 | HR 96 | Ht 60.0 in | Wt 128.0 lb

## 2024-06-25 DIAGNOSIS — M1712 Unilateral primary osteoarthritis, left knee: Secondary | ICD-10-CM

## 2024-06-25 MED ORDER — LEVOCETIRIZINE DIHYDROCHLORIDE 5 MG PO TABS: 5.0000 mg | ORAL_TABLET | Freq: Every evening | ORAL | 0 refills | Status: DC

## 2024-06-25 MED ORDER — TRIAMCINOLONE ACETONIDE 32 MG IX SRER
32.0000 mg | Freq: Once | INTRA_ARTICULAR | Status: AC
  Administered 2024-06-25: 32 mg via INTRA_ARTICULAR

## 2024-06-25 NOTE — Progress Notes (Signed)
 LEFT Knee injection With ultrasound guidance)  Indication:LEFT Knee pain not relieved by medication management and other conservative care.  Informed consent was obtained after describing risks and benefits of the procedure with the patient, this includes bleeding, bruising, infection and medication side effects. The patient wishes to proceed and has given written consent. The patient was placed in a recumbent position. The medial aspect of the knee was marked and prepped with Betadine and alcohol. It was then entered with a 27-gauge 1-1/2 inch needle and 1 mL of 1% lidocaine  was injected into the skin and subcutaneous tissue. Then another 21gm2needle was inserted into the knee joint under direct long axis US  guidance using 15Hz  linear transducer. After negative draw back for blood, 32mg  ( ) of Zilretta  (triamcinolone  long acting) . The patient tolerated the procedure well. Post procedure instructions were given.

## 2024-06-25 NOTE — Telephone Encounter (Signed)
 Copied from CRM #[id number]. Topic: Clinical - Medication Refill >> Jun 25, 2024  9:20 AM Timindy P wrote: Medication:  levocetirizine (XYZAL ) 5 MG tablet   Has the patient contacted their pharmacy? Yes- Currently has no refills and pt only has 4 pills remaining.    This is the patient's preferred pharmacy:  [id number] - [place], [place] - [address] [place] [address] Phone: [phone] Fax: [phone]  Is this the correct pharmacy for this prescription? Yes If no, delete pharmacy and type the correct one.   Has the prescription been filled recently? No  Is the patient out of the medication? No  Has the patient been seen for an appointment in the last year OR does the patient have an upcoming appointment? Yes  Can we respond through MyChart? No- please call [phone]  Agent: Please be advised that Rx refills may take up to 3 business days. We ask that you follow-up with your pharmacy.

## 2024-06-26 ENCOUNTER — Ambulatory Visit: Admitting: Cardiology

## 2024-07-01 ENCOUNTER — Other Ambulatory Visit: Payer: Self-pay | Admitting: Family Medicine

## 2024-07-02 ENCOUNTER — Telehealth: Payer: Self-pay | Admitting: Pharmacy Technician

## 2024-07-02 ENCOUNTER — Other Ambulatory Visit (HOSPITAL_COMMUNITY): Payer: Self-pay

## 2024-07-02 ENCOUNTER — Telehealth: Payer: Self-pay | Admitting: Cardiology

## 2024-07-02 DIAGNOSIS — Z79899 Other long term (current) drug therapy: Secondary | ICD-10-CM

## 2024-07-02 NOTE — Telephone Encounter (Signed)
 Called and notified patient.

## 2024-07-02 NOTE — Telephone Encounter (Signed)
 Please call patient: We received a refill request from her pharmacy for her tramadol  medication.  Please remind her that she needs to be seen every 6 months in order to get refills on this medication.    If she is requiring refills please schedule her for a follow-up visit at her earliest convenience-must be seen by this provider

## 2024-07-02 NOTE — Telephone Encounter (Signed)
 Pt c/o medication issue:  1. Name of Medication:   rivaroxaban  (XARELTO ) 20 MG TABS tablet   2. How are you currently taking this medication (dosage and times per day)?    3. Are you having a reaction (difficulty breathing--STAT)?   4. What is your medication issue?   Patient wants to get assistance with this medication as it is too expensive.  Patient wants to know if she can get a prescription for the generic version as it is less expensive sent to [facility] - [place], [place] - [address].

## 2024-07-02 NOTE — Telephone Encounter (Signed)
 PAP: Patient assistance application for Xarelto  through [employer] has been mailed to pt's home address on file.  Provider portion will be uploaded to media

## 2024-07-03 ENCOUNTER — Encounter: Payer: Self-pay | Admitting: Oncology

## 2024-07-03 ENCOUNTER — Inpatient Hospital Stay: Payer: Self-pay | Attending: Oncology

## 2024-07-03 ENCOUNTER — Inpatient Hospital Stay (HOSPITAL_BASED_OUTPATIENT_CLINIC_OR_DEPARTMENT_OTHER): Payer: Self-pay | Admitting: Oncology

## 2024-07-03 ENCOUNTER — Other Ambulatory Visit (HOSPITAL_COMMUNITY): Payer: Self-pay

## 2024-07-03 ENCOUNTER — Other Ambulatory Visit: Payer: Self-pay | Admitting: Family Medicine

## 2024-07-03 VITALS — BP 126/82 | HR 88 | Temp 98.0°F | Resp 16 | Ht 61.0 in | Wt 127.0 lb

## 2024-07-03 DIAGNOSIS — D75839 Thrombocytosis, unspecified: Secondary | ICD-10-CM | POA: Insufficient documentation

## 2024-07-03 DIAGNOSIS — I4891 Unspecified atrial fibrillation: Secondary | ICD-10-CM | POA: Insufficient documentation

## 2024-07-03 DIAGNOSIS — Z8673 Personal history of transient ischemic attack (TIA), and cerebral infarction without residual deficits: Secondary | ICD-10-CM | POA: Diagnosis not present

## 2024-07-03 DIAGNOSIS — Z7901 Long term (current) use of anticoagulants: Secondary | ICD-10-CM | POA: Insufficient documentation

## 2024-07-03 DIAGNOSIS — Z800 Family history of malignant neoplasm of digestive organs: Secondary | ICD-10-CM | POA: Diagnosis not present

## 2024-07-03 DIAGNOSIS — Z803 Family history of malignant neoplasm of breast: Secondary | ICD-10-CM | POA: Diagnosis not present

## 2024-07-03 DIAGNOSIS — Z951 Presence of aortocoronary bypass graft: Secondary | ICD-10-CM | POA: Insufficient documentation

## 2024-07-03 DIAGNOSIS — D509 Iron deficiency anemia, unspecified: Secondary | ICD-10-CM | POA: Insufficient documentation

## 2024-07-03 DIAGNOSIS — R21 Rash and other nonspecific skin eruption: Secondary | ICD-10-CM | POA: Diagnosis not present

## 2024-07-03 DIAGNOSIS — D500 Iron deficiency anemia secondary to blood loss (chronic): Secondary | ICD-10-CM | POA: Diagnosis not present

## 2024-07-03 DIAGNOSIS — Z8719 Personal history of other diseases of the digestive system: Secondary | ICD-10-CM | POA: Diagnosis not present

## 2024-07-03 LAB — CBC WITH DIFFERENTIAL (CANCER CENTER ONLY)
Abs Immature Granulocytes: 0.04 K/uL (ref 0.00–0.07)
Basophils Absolute: 0.1 K/uL (ref 0.0–0.1)
Basophils Relative: 1 %
Eosinophils Absolute: 0.2 K/uL (ref 0.0–0.5)
Eosinophils Relative: 2 %
HCT: 31.9 % — ABNORMAL LOW (ref 36.0–46.0)
Hemoglobin: 9.3 g/dL — ABNORMAL LOW (ref 12.0–15.0)
Immature Granulocytes: 1 %
Lymphocytes Relative: 24 %
Lymphs Abs: 2.1 K/uL (ref 0.7–4.0)
MCH: 24.9 pg — ABNORMAL LOW (ref 26.0–34.0)
MCHC: 29.2 g/dL — ABNORMAL LOW (ref 30.0–36.0)
MCV: 85.5 fL (ref 80.0–100.0)
Monocytes Absolute: 1 K/uL (ref 0.1–1.0)
Monocytes Relative: 12 %
Neutro Abs: 5.4 K/uL (ref 1.7–7.7)
Neutrophils Relative %: 60 %
Platelet Count: 534 K/uL — ABNORMAL HIGH (ref 150–400)
RBC: 3.73 MIL/uL — ABNORMAL LOW (ref 3.87–5.11)
RDW: 17 % — ABNORMAL HIGH (ref 11.5–15.5)
WBC Count: 8.9 K/uL (ref 4.0–10.5)
nRBC: 0 % (ref 0.0–0.2)

## 2024-07-03 LAB — IRON AND IRON BINDING CAPACITY (CC-WL,HP ONLY)
Iron: 30 ug/dL (ref 28–170)
Saturation Ratios: 8 % — ABNORMAL LOW (ref 10.4–31.8)
TIBC: 360 ug/dL (ref 250–450)
UIBC: 330 ug/dL (ref 148–442)

## 2024-07-03 LAB — FERRITIN: Ferritin: 58 ng/mL (ref 11–307)

## 2024-07-03 NOTE — Telephone Encounter (Signed)
 ICD 10 code completed and form faxed to [phone]

## 2024-07-03 NOTE — Assessment & Plan Note (Addendum)
 Chronic iron  deficiency anemia with recent hemoglobin drop to 6.5 g/dL on 04/26/7973.   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  She is on chronic anticoagulation with Xarelto  which could also be contributing.  She did receive 2 units of PRBC transfusion on 10/04/2023.   On initial consultation with us  on 10/26/2023, labs revealed hemoglobin of 10, hematocrit 34.6, MCV 71.4.  White count 11,000 with normal differential.  Platelet count elevated at 535,000, likely reactionary from iron  deficiency.  Iron  studies continued to show evidence of iron  deficiency with iron  saturation of 6%, iron  decreased at 21.  Ferritin decreased at 10.  B12, folate, LDH, reticulocyte count were all within normal limits.  Coombs test negative.   Discussed IV iron  infusion options and potential need for repeat infusions if ongoing blood loss is identified.  She was treated with IV iron  using Venofer  x 5 doses weekly.   On 12/25/2023 she underwent EGD and colonoscopy.  EGD showed LA grade A esophagitis but no other gross lesions in the esophagus.  Evidence of gastritis was noted.  3 angioectasias were noted in the duodenum, treated with argon plasma coagulation.  Colonoscopy showed seven 2 to 6 mm polyps at the rectosigmoid colon, descending colon, ascending colon and cecum.  Diverticulosis in the rectosigmoid colon and sigmoid colon.  Nonbleeding, nonthrombosed external and internal hemorrhoids were noted.  Pathology from resected polyps showed benign findings.  On her follow-up visit with us  on 01/04/2024, hemoglobin was much better at 13.9, MCV now normal at 88.9.  Platelet count also improved to 424,000.  Since she was experiencing constipation and nausea with Fergon, we switched her to Accrufer  in April 2025.  However patient apparently experienced constipation with this as well and has been taking 1 tablet every other day instead of 1 tablet  twice daily as recommended.  Labs today showed persistent anemia with hemoglobin of 9.3, lower compared to recent values.  Iron  saturation is decreased at 8%.  Patient would like to defer IV iron  at this time and would like to try Accrufer  twice daily and use stool softeners in case of constipation.  Constipation is considered to be less likely with Accrufer  but patient is experiencing this side effect unfortunately.  - Encourage dietary fiber intake to manage constipation. - Consider Dulcolax or Miralax  for constipation management.  RTC in 4 months for reevaluation, sooner if progressive symptoms from anemia.

## 2024-07-03 NOTE — Progress Notes (Signed)
 [facility]  HEMATOLOGY CLINIC PROGRESS NOTE  PATIENT NAME: [name]   MR#: [id number] DOB: 03/07/1961  Patient Care Team: Catherine Charlies LABOR, DO as PCP - General (Family Medicine) [name], MD as PCP - Cardiology (Cardiology) Jerri Pfeiffer, MD as Consulting Physician (Neurology) Tobie Burgess [place], MD as Consulting Physician (Physical Medicine and Rehabilitation) Dolphus Carrion, MD as Consulting Physician (Interventional Radiology) Mansouraty, Aloha Raddle., MD as Consulting Physician (Gastroenterology) Eletha Boas, MD as Consulting Physician (General Surgery) [name], MD as Consulting Physician (Urology) Inocencio Soyla Lunger, MD as Consulting Physician (Cardiology) Johnson Laymon CHRISTELLA RIGGERS as Physician Assistant (Cardiology)  Date of visit: 07/03/2024   ASSESSMENT & PLAN:   [name] is a 63 y.o. lady with a past medical history of CVA, atrial fibrillation (on anticoagulation), CAD (status post CABG in Feb 2019), cardiomyopathy, recurrent idiopathic acute pancreatitis (status post ERCP with CBD and PD sphincterotomies for equivocal SOD findings on manometry with recurrent PD stenosis), status post cholecystectomy, pancreatic cyst, GERD, colon polyps (TAs) , was referred to our service in January 2025 for evaluation of microcytic, hypochromic anemia.  Presumed to be occult GI blood loss.  Iron  deficiency anemia due to chronic blood loss Chronic iron  deficiency anemia with recent hemoglobin drop to 6.5 g/dL on 04/26/7973.   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  She is on chronic anticoagulation with Xarelto  which could also be contributing.  She did receive 2 units of PRBC transfusion on 10/04/2023.   On initial consultation with us  on 10/26/2023, labs revealed hemoglobin of 10, hematocrit 34.6, MCV 71.4.  White count 11,000 with normal differential.   Platelet count elevated at 535,000, likely reactionary from iron  deficiency.  Iron  studies continued to show evidence of iron  deficiency with iron  saturation of 6%, iron  decreased at 21.  Ferritin decreased at 10.  B12, folate, LDH, reticulocyte count were all within normal limits.  Coombs test negative.   Discussed IV iron  infusion options and potential need for repeat infusions if ongoing blood loss is identified.  She was treated with IV iron  using Venofer  x 5 doses weekly.   On 12/25/2023 she underwent EGD and colonoscopy.  EGD showed LA grade A esophagitis but no other gross lesions in the esophagus.  Evidence of gastritis was noted.  3 angioectasias were noted in the duodenum, treated with argon plasma coagulation.  Colonoscopy showed seven 2 to 6 mm polyps at the rectosigmoid colon, descending colon, ascending colon and cecum.  Diverticulosis in the rectosigmoid colon and sigmoid colon.  Nonbleeding, nonthrombosed external and internal hemorrhoids were noted.  Pathology from resected polyps showed benign findings.  On her follow-up visit with us  on 01/04/2024, hemoglobin was much better at 13.9, MCV now normal at 88.9.  Platelet count also improved to 424,000.  Since she was experiencing constipation and nausea with Fergon, we switched her to Accrufer  in April 2025.  However patient apparently experienced constipation with this as well and has been taking 1 tablet every other day instead of 1 tablet twice daily as recommended.  Labs today showed persistent anemia with hemoglobin of 9.3, lower compared to recent values.  Iron  saturation is decreased at 8%.  Patient would like to defer IV iron  at this time and would like to try Accrufer  twice daily and use stool softeners in case of constipation.  Constipation is considered to be less likely  with Accrufer  but patient is experiencing this side effect unfortunately.  - Encourage dietary fiber intake to manage constipation. - Consider Dulcolax or  Miralax  for constipation management.  RTC in 4 months for reevaluation, sooner if progressive symptoms from anemia.  Thrombocytosis Platelet counts fluctuating and it is increased at 534,000 today.  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    I spent a total of 27 minutes during this encounter with the patient including review of chart and various tests results, discussions about plan of care and coordination of care plan.  I reviewed lab results and outside records for this visit and discussed relevant results with the patient. Diagnosis, plan of care and treatment options were also discussed in detail with the patient. Opportunity provided to ask questions and answers provided to her apparent satisfaction. Provided instructions to call our clinic with any problems, questions or concerns prior to return visit. I recommended to continue follow-up with PCP and sub-specialists. She verbalized understanding and agreed with the plan. No barriers to learning was detected.  Chinita Patten, MD  07/03/2024 1:38 PM   [facility] - [facility] [address] [place] [address] Dept: [phone] Dept Fax: [phone]   CHIEF COMPLAINT/ REASON FOR VISIT:  Iron  deficiency anemia from chronic blood loss, presumed GI origin.  INTERVAL HISTORY:  Discussed the use of AI scribe software for clinical note transcription with the patient, who gave verbal consent to proceed.  History of Present Illness  [name] is a 63 year old female with iron  deficiency anemia who presents for follow-up of her iron  levels and anemia management.  She has been experiencing ongoing issues with iron  deficiency anemia. Her hemoglobin levels have decreased from 9.9 to 9.3 over the past three months, despite taking iron  supplements. She is currently taking Accrufer  every other day, one tablet. Her iron  saturation was previously  noted to be 6%, and her iron  levels were decreased.   She experiences constipation as a side effect of the iron  supplements, which she manages with stool softeners and laxatives as needed. Additionally, she has noticed significant dental decay, which she attributes to the iron  supplements. She prefers oral iron  supplements over intravenous iron  infusions.  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 She also has a hiatal hernia and a small aortic aneurysm, which is monitored regularly. She is on blood thinners, which may increase her risk of blood loss.   No new gastrointestinal symptoms aside from those related to iron  supplementation.   SUMMARY OF HEMATOLOGIC HISTORY:  On 10/04/2023, labs showed hemoglobin of 6.5, hematocrit 23.6, MCV 66.  White count 8500 with normal differential, platelet count elevated at 566,000.   She reports a significant drop in hemoglobin levels, which was discovered during routine blood work in early January 2025.  The patient denied any noticeable blood loss but mentions a history of light vaginal bleeding, which has occurred twice and lasted for about two to three days. She also reports occasional black stools during episodes of diarrhea and constipation, which she attributes to iron  supplementation.   In addition to anemia, the patient has been experiencing a rash on her back and shoulders for about two weeks. The rash is itchy and has not responded to topical Benadryl  cream. The patient reports no changes in soap, detergent, or moisturizer use.  She also mentions taking tramadol  for pain and has recently started taking hydrocodone .   The patient's energy levels seem to be relatively stable despite the low hemoglobin levels. She expresses frustration and concern about the cause of her anemia and the fluctuating hemoglobin  levels.   She is on chronic anticoagulation with Xarelto  for her history of A-fib and cardiac issues.   No obvious source of significant blood loss identified.    Possible microscopic gastrointestinal bleeding, differential includes AVMs and other vascular malformations.    She did receive 2 units of PRBC transfusion on 10/04/2023.  On initial consultation with us  on 10/26/2023, labs revealed hemoglobin of 10, hematocrit 34.6, MCV 71.4.  White count 11,000 with normal differential.  Platelet count elevated at 535,000, likely reactionary from iron  deficiency.  Iron  studies continued to show evidence of iron  deficiency with iron  saturation of 6%, iron  decreased at 21.  Ferritin decreased at 10.  B12, folate, LDH, reticulocyte count were all within normal limits.  Coombs test negative.   Discussed IV iron  infusion options and potential need for repeat infusions if ongoing blood loss is identified.    We got approval for Venofer  and she was given Venofer  200 mg weekly x 5 doses at our infusion center at [facility].  On 12/25/2023 she underwent EGD and colonoscopy.  EGD showed LA grade A esophagitis but no other gross lesions in the esophagus.  Evidence of gastritis was noted.  3 angioectasias were noted in the duodenum, treated with argon plasma coagulation.  Colonoscopy showed seven 2 to 6 mm polyps at the rectosigmoid colon, descending colon, ascending colon and cecum.  Diverticulosis in the rectosigmoid colon and sigmoid colon.  Nonbleeding, nonthrombosed external and internal hemorrhoids were noted.  Pathology from resected polyps showed benign findings.  On her follow-up visit with us  on 01/04/2024, hemoglobin was much better at 13.9, MCV now normal at 88.9.  Platelet count also improved to 424,000.  I have reviewed the past medical history, past surgical history, social history and family history with the patient and they are unchanged from previous note.  ALLERGIES: She is allergic to  hydromorphone , lopressor  [metoprolol  tartrate], sacubitril -valsartan , allegra  allergy [fexofenadine  hcl], fexofenadine , lasix  [furosemide ], nsaids, gabapentin , gabapentin  (once-daily), and lipitor  [atorvastatin ].  MEDICATIONS:  Current Outpatient Medications  Medication Sig Dispense Refill   Cyanocobalamin  (VITAMIN B-12 CR PO) Take by mouth.     Ferrous Sulfate  Dried (FERROUS SULFATE  IRON  PO) Take by mouth. (Patient taking differently: Take by mouth. Takes every other day)     FOLIC ACID  PO Take by mouth.     levocetirizine (XYZAL ) 5 MG tablet Take 1 tablet (5 mg total) by mouth every evening. 30 tablet 0   omeprazole  (PRILOSEC) 40 MG capsule Take 1 capsule (40 mg total) by mouth daily. 60 capsule 6   pravastatin  (PRAVACHOL ) 80 MG tablet Take 1 tablet (80 mg total) by mouth every evening. 90 tablet 3   rivaroxaban  (XARELTO ) 20 MG TABS tablet Take 1 tablet (20 mg total) by mouth daily with supper. 30 tablet 5   traMADol  (ULTRAM ) 50 MG tablet Take 1 tablet (50 mg total) by mouth every 6 (six) hours. 120 tablet 5   triamcinolone  cream (KENALOG ) 0.1 % Apply 1 Application topically 2 (two) times daily.     No current facility-administered medications for this visit.     REVIEW OF SYSTEMS:    Review of Systems - Oncology  All other pertinent systems were reviewed with the patient and are negative.  PHYSICAL EXAMINATION:   Onc Performance Status - 07/03/24 1108       ECOG Perf Status   ECOG Perf Status Ambulatory and capable of all selfcare but unable to carry out any work activities.  Up and about more than 50% of waking hours      KPS SCALE   KPS % SCORE Cares for self, unable to carry on normal activity or to do active work          Vitals:   07/03/24 1100  BP: 126/82  Pulse: 88  Resp: 16  Temp: 98 F (36.7 C)  SpO2: 97%   Filed Weights   07/03/24 1100  Weight: 127 lb (57.6 kg)    Physical Exam Constitutional:      General: She is not in acute distress.     Appearance: Normal appearance.  HENT:     Head: Normocephalic and atraumatic.  Eyes:     General: No scleral icterus.    Conjunctiva/sclera: Conjunctivae normal.  Cardiovascular:     Rate and Rhythm: Normal rate and regular rhythm.  Pulmonary:     Effort: Pulmonary effort is normal.  Abdominal:     General: There is no distension.  Musculoskeletal:     Right lower leg: No edema.     Left lower leg: No edema.  Neurological:     General: No focal deficit present.     Mental Status: She is alert and oriented to person, place, and time.  Psychiatric:        Mood and Affect: Mood normal.        Behavior: Behavior normal.        Thought Content: Thought content normal.     LABORATORY DATA:   I have reviewed the data as listed.  Results for orders placed or performed in visit on 07/03/24  Ferritin  Result Value Ref Range   Ferritin 58 11 - 307 ng/mL  Iron  and Iron  Binding Capacity ([facility] only)  Result Value Ref Range   Iron  30 28 - 170 ug/dL   TIBC 639 749 - 549 ug/dL   Saturation Ratios 8 (L) 10.4 - 31.8 %   UIBC 330 148 - 442 ug/dL  CBC with Differential ([facility] Only)  Result Value Ref Range   WBC Count 8.9 4.0 - 10.5 K/uL   RBC 3.73 (L) 3.87 - 5.11 MIL/uL   Hemoglobin 9.3 (L) 12.0 - 15.0 g/dL   HCT 68.0 (L) 63.9 - 53.9 %   MCV 85.5 80.0 - 100.0 fL   MCH 24.9 (L) 26.0 - 34.0 pg   MCHC 29.2 (L) 30.0 - 36.0 g/dL   RDW 82.9 (H) 88.4 - 84.4 %   Platelet Count 534 (H) 150 - 400 K/uL   nRBC 0.0 0.0 - 0.2 %   Neutrophils Relative % 60 %   Neutro Abs 5.4 1.7 - 7.7 K/uL   Lymphocytes Relative 24 %   Lymphs Abs 2.1 0.7 - 4.0 K/uL   Monocytes Relative 12 %   Monocytes Absolute 1.0 0.1 - 1.0 K/uL   Eosinophils Relative 2 %   Eosinophils Absolute 0.2 0.0 - 0.5 K/uL   Basophils Relative 1 %   Basophils Absolute 0.1 0.0 - 0.1 K/uL   Immature Granulocytes 1 %   Abs Immature Granulocytes 0.04 0.00 - 0.07 K/uL    RADIOGRAPHIC STUDIES:  No recent pertinent imaging  studies available to review.  Orders Placed This Encounter  Procedures   CBC with Differential [facility]  Only)    Standing Status:   Future    Expected Date:   11/03/2024    Expiration Date:   02/01/2025   Iron  and Iron  Binding Capacity ([facility] only)    Standing Status:   Future    Expected Date:   11/03/2024    Expiration Date:   02/01/2025   Ferritin    Standing Status:   Future    Expected Date:   11/03/2024    Expiration Date:   02/01/2025     Future Appointments  Date Time Provider Department Center  07/18/2024 10:15 AM Inocencio Soyla Lunger, MD [facility]  08/07/2024 11:00 AM Debera Jayson MATSU, MD [facility]  10/17/2024 10:35 AM Cleotilde Ronal RAMAN, MD [facility] [id number]  11/06/2024 11:15 AM [facility] None  11/06/2024 11:45 AM Terica Yogi, Chinita, MD [facility] None  01/28/2025 11:00 AM Trixie File, MD [facility] None     This document was completed utilizing speech recognition software. Grammatical errors, random word insertions, pronoun errors, and incomplete sentences are an occasional consequence of this system due to software limitations, ambient noise, and hardware issues. Any formal questions or concerns about the content, text or information contained within the body of this dictation should be directly addressed to the provider for clarification.

## 2024-07-03 NOTE — Assessment & Plan Note (Addendum)
 Platelet counts fluctuating and it is increased at 534,000 today.  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

## 2024-07-03 NOTE — Telephone Encounter (Signed)
 [employer] calling about missing materials for this application. Signed authorization page and ICD10 code on page 3 section 1 are missing.   Fax; [phone]  Phone: [phone]

## 2024-07-03 NOTE — Telephone Encounter (Signed)
 Provider portion completed and faxed to [phone].

## 2024-07-04 ENCOUNTER — Ambulatory Visit: Payer: Self-pay

## 2024-07-04 NOTE — Telephone Encounter (Signed)
 Pt states that she can not afford Xarelto  at $500 a month. Pt reports that she was using a co-pay card and was only paying $10 a month. She reports that she was told that she has maxed out the benefits. She is requesting another medication that is a tier 3 and covered by her insurance. Pt states that she has applied for patient assist 3 times and has been denied each time. She reports has land and money in the bank and states this is the reason she was denied. Please advise.

## 2024-07-04 NOTE — Telephone Encounter (Signed)
 Patient is following up. She says per her insurance, Xarelto  will cost her $500 for 28 tablets, but they will cover Plavix  if she can be switched to it. Please advise.

## 2024-07-04 NOTE — Telephone Encounter (Signed)
 Unfortunately, I cannot accommodate her request for a partial refill for tramadol  since it is considered a controlled substance, it is  against the law for me to refill this medication without a face-to-face visit.

## 2024-07-04 NOTE — Telephone Encounter (Signed)
 Spoke with patient regarding results/recommendations.

## 2024-07-04 NOTE — Telephone Encounter (Signed)
  Reason for Disposition  Caller requesting a CONTROLLED substance prescription refill (e.g., narcotics, ADHD medicines)  Answer Assessment - Initial Assessment Questions 1. DRUG NAME: What medicine do you need to have refilled?     Tramadol   Patient is requesting bridge refill until appt on Oct 20th. No sooner appts available.  Protocols used: Medication Refill and Renewal Call-A-AH  Copied from CRM #[id number]. Topic: Clinical - Prescription Issue >> Jul 04, 2024  8:27 AM [name] wrote: Reason for CRM: Patient called said the pharmacy told her to schedule an appointment before she can get a refill and now she's down to her last medication: traMADol  (ULTRAM ) 50 MG tablet [[id number]].

## 2024-07-04 NOTE — Telephone Encounter (Signed)
 Pt is requesting a partial refill on Tramadol  until her appt on 10/20. Pt was originally due for an appt in August. Please advise.

## 2024-07-05 ENCOUNTER — Other Ambulatory Visit (HOSPITAL_COMMUNITY): Payer: Self-pay

## 2024-07-05 NOTE — Telephone Encounter (Signed)
 Plavix  is not an option for her.  Agree that Eliquis would be the best option because she can use a copay card. Will need updated BMP to check renal function for proper dosing.

## 2024-07-05 NOTE — Telephone Encounter (Signed)
 Patient will get bmet on Monday, 07/08/24 and would like to start eliquis. We can offer her 30 day Free trial card and $10/co-pay card.

## 2024-07-05 NOTE — Telephone Encounter (Signed)
 Is patient eligible for any grants?

## 2024-07-08 ENCOUNTER — Other Ambulatory Visit (HOSPITAL_COMMUNITY)
Admission: RE | Admit: 2024-07-08 | Discharge: 2024-07-08 | Disposition: A | Discharge status: Home / Self Care | Source: Ambulatory Visit | Attending: Cardiology | Admitting: Cardiology

## 2024-07-08 ENCOUNTER — Ambulatory Visit: Payer: Self-pay | Admitting: Cardiology

## 2024-07-08 DIAGNOSIS — Z79899 Other long term (current) drug therapy: Secondary | ICD-10-CM | POA: Insufficient documentation

## 2024-07-08 LAB — BASIC METABOLIC PANEL WITH GFR
Anion gap: 11 (ref 5–15)
BUN: 11 mg/dL (ref 8–23)
CO2: 25 mmol/L (ref 22–32)
Calcium: 9.9 mg/dL (ref 8.9–10.3)
Chloride: 100 mmol/L (ref 98–111)
Creatinine, Ser: 0.83 mg/dL (ref 0.44–1.00)
GFR, Estimated: >60 mL/min (ref >60)
Glucose, Bld: 121 mg/dL — ABNORMAL HIGH (ref 70–99)
Potassium: 4.3 mmol/L (ref 3.5–5.1)
Sodium: 135 mmol/L (ref 135–145)

## 2024-07-08 MED ORDER — APIXABAN 5 MG PO TABS: 5.0000 mg | ORAL_TABLET | Freq: Two times a day (BID) | ORAL | 11 refills | Status: DC

## 2024-07-08 NOTE — Telephone Encounter (Signed)
-----   Message from Jayson Sierras sent at 07/08/2024 11:57 AM EDT ----- Results reviewed.  Recent telephone communication about anticoagulation.  Renal function normal, could start Eliquis 5 mg twice daily. ----- Message ----- From: Interface, Lab In [place] Sent: 07/08/2024  11:22 AM EDT To: Jayson KANDICE Sierras, MD

## 2024-07-08 NOTE — Telephone Encounter (Signed)
 I spoke with [name], her last dose of Xarelto  was yesterday.She will start Eliquis 5 mg bid tonight.  She was given a 30 day Free trial card and a $10-co-pay card.

## 2024-07-15 ENCOUNTER — Encounter: Payer: Self-pay | Admitting: Family Medicine

## 2024-07-15 ENCOUNTER — Ambulatory Visit: Admitting: Family Medicine

## 2024-07-15 VITALS — BP 122/82 | HR 108 | Temp 97.9°F | Wt 126.8 lb

## 2024-07-15 DIAGNOSIS — L509 Urticaria, unspecified: Secondary | ICD-10-CM

## 2024-07-15 DIAGNOSIS — G8110 Spastic hemiplegia affecting unspecified side: Secondary | ICD-10-CM

## 2024-07-15 DIAGNOSIS — M170 Bilateral primary osteoarthritis of knee: Secondary | ICD-10-CM

## 2024-07-15 DIAGNOSIS — R269 Unspecified abnormalities of gait and mobility: Secondary | ICD-10-CM

## 2024-07-15 DIAGNOSIS — Z23 Encounter for immunization: Secondary | ICD-10-CM

## 2024-07-15 DIAGNOSIS — Z8673 Personal history of transient ischemic attack (TIA), and cerebral infarction without residual deficits: Secondary | ICD-10-CM

## 2024-07-15 DIAGNOSIS — R52 Pain, unspecified: Secondary | ICD-10-CM | POA: Insufficient documentation

## 2024-07-15 MED ORDER — HYDROCODONE-ACETAMINOPHEN 5-325 MG PO TABS: 1.0000 | ORAL_TABLET | Freq: Two times a day (BID) | ORAL | 0 refills | Status: DC

## 2024-07-15 MED ORDER — LEVOCETIRIZINE DIHYDROCHLORIDE 5 MG PO TABS: 5.0000 mg | ORAL_TABLET | Freq: Every evening | ORAL | 3 refills | Status: AC
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
## 2024-07-15 NOTE — Patient Instructions (Addendum)
 Return in about 11 weeks (around 09/30/2024) for cpe (20 min), Routine chronic condition follow-up.        Great to see you today.  I have refilled the medication(s) we provide.   If labs were collected or images ordered, we will inform you of  results once we have received them and reviewed. We will contact you either by echart message, or telephone call.  Please give ample time to the testing facility, and our office to run,  receive and review results. Please do not call inquiring of results, even if you can see them in your chart. We will contact you as soon as we are able. If it has been over 1 week since the test was completed, and you have not yet heard from us , then please call us .    - echart message- for normal results that have been seen by the patient already.   - telephone call: abnormal results or if patient has not viewed results in their echart.  If a referral to a specialist was entered for you, please call us  in 2 weeks if you have not heard from the specialist office to schedule.

## 2024-07-16 NOTE — Progress Notes (Unsigned)
  Electrophysiology Office Note:   Date:  07/18/2024  ID:  [name], DOB 1961/01/09, MRN [id number]  Primary Cardiologist: Jayson Sierras, MD Primary Heart Failure: None Electrophysiologist: Kavontae Pritchard Gladis Norton, MD      History of Present Illness:   [name] is a 63 y.o. female with h/o coronary artery disease post non-STEMI in 2019 with three-vessel CABG, CVA, atrial fibrillation, chronic systolic heart failure, carotid stenosis, COPD seen today for routine electrophysiology followup.   Since last being seen in our clinic the patient reports doing overall well.  She notes no palpitations.  She continues to do her daily activities.  She has no acute cardiac complaints.  she denies chest pain, palpitations, dyspnea, PND, orthopnea, nausea, vomiting, dizziness, syncope, edema, weight gain, or early satiety.   Review of systems complete and found to be negative unless listed in HPI.   EP Information / Studies Reviewed:    EKG is ordered today. Personal review as below.  EKG Interpretation Date/Time:  Thursday July 18 2024 10:12:39 EDT Ventricular Rate:  111 PR Interval:  142 QRS Duration:  86 QT Interval:  340 QTC Calculation: 462 R Axis:   70  Text Interpretation: Sinus tachycardia with occasional Premature ventricular complexes Nonspecific ST and T wave abnormality When compared with ECG of 02-Apr-2024 15:08, Premature ventricular complexes are now Present ST now depressed in Anterior leads T wave inversion now evident in Lateral leads Confirmed by [name] ([id number]) on 07/18/2024 10:31:55 AM     Risk Assessment/Calculations:    CHA2DS2-VASc Score = 6   This indicates a 9.7% annual risk of stroke. The patient's score is based upon: CHF History: 1 HTN History: 1 Diabetes History: 0 Stroke History: 2 Vascular Disease History: 1 Age Score: 0 Gender Score: 1             Physical Exam:   VS:  BP 108/70   Pulse (!) 111   Ht 5' 1 (1.549 m)   Wt 125  lb (56.7 kg)   LMP  (LMP Unknown) Comment: at age 37  SpO2 95%   BMI 23.62 kg/m    Wt Readings from Last 3 Encounters:  07/18/24 125 lb (56.7 kg)  07/15/24 126 lb 12.8 oz (57.5 kg)  07/03/24 127 lb (57.6 kg)     GEN: Well nourished, well developed in no acute distress NECK: No JVD; No carotid bruits CARDIAC: Regular rate and rhythm with occasional ectopy, no murmurs, rubs, gallops RESPIRATORY:  Clear to auscultation without rales, wheezing or rhonchi  ABDOMEN: Soft, non-tender, non-distended EXTREMITIES:  No edema; No deformity   ASSESSMENT AND PLAN:    1.  Paroxysmal atrial fibrillation: Likely the cause of her CVA.  On Xarelto .  2.  PVCs: 14% burden on cardiac monitor.  Was started on mexiletine 250 mg twice daily.  Mexiletine has been stopped due to side effects.  She is minimally symptomatic from her PVCs.  Her ejection fraction is mildly reduced in the setting of coronary artery disease and CABG.  Merlon Alcorta plan for repeat echo.  If her ejection fraction remains stable, we [name] make no further changes.  3.  Chronic systolic heart failure: No evidence of volume overload.  Plan per primary cardiology.  4.  Coronary artery disease: Post three-vessel CABG.  Plan per primary cardiology.  4.  Secondary hypercoagulable state: On Xarelto   Follow up with EP Team in 6 months  Signed, [name] Gladis Norton, MD

## 2024-07-18 ENCOUNTER — Ambulatory Visit: Payer: Self-pay | Attending: Internal Medicine | Admitting: Cardiology

## 2024-07-18 ENCOUNTER — Encounter: Payer: Self-pay | Admitting: Cardiology

## 2024-07-18 VITALS — BP 108/70 | HR 111 | Ht 61.0 in | Wt 125.0 lb

## 2024-07-18 DIAGNOSIS — D6869 Other thrombophilia: Secondary | ICD-10-CM | POA: Diagnosis not present

## 2024-07-18 DIAGNOSIS — I480 Paroxysmal atrial fibrillation: Secondary | ICD-10-CM

## 2024-07-18 DIAGNOSIS — I5022 Chronic systolic (congestive) heart failure: Secondary | ICD-10-CM

## 2024-07-18 DIAGNOSIS — I493 Ventricular premature depolarization: Secondary | ICD-10-CM | POA: Diagnosis not present

## 2024-07-18 DIAGNOSIS — I10 Essential (primary) hypertension: Secondary | ICD-10-CM

## 2024-07-18 DIAGNOSIS — I2510 Atherosclerotic heart disease of native coronary artery without angina pectoris: Secondary | ICD-10-CM

## 2024-07-18 NOTE — Patient Instructions (Signed)
 Medication Instructions:  Your physician recommends that you continue on your current medications as directed. Please refer to the Current Medication list given to you today.  *If you need a refill on your cardiac medications before your next appointment, please call your pharmacy*   Testing/Procedures: Echocardiogram  Your physician has requested that you have an echocardiogram. Echocardiography is a painless test that uses sound waves to create images of your heart. It provides your doctor with information about the size and shape of your heart and how well your heart's chambers and valves are working. This procedure takes approximately one hour. There are no restrictions for this procedure. Please do NOT wear cologne, perfume, aftershave, or lotions (deodorant is allowed). Please arrive 15 minutes prior to your appointment time.  Please note: We ask at that you not bring children with you during ultrasound (echo/ vascular) testing. Due to room size and safety concerns, children are not allowed in the ultrasound rooms during exams. Our front office staff cannot provide observation of children in our lobby area while testing is being conducted. An adult accompanying a patient to their appointment will only be allowed in the ultrasound room at the discretion of the ultrasound technician under special circumstances. We apologize for any inconvenience.   Follow-Up: At [facility], you and your health needs are our priority.  As part of our continuing mission to provide you with exceptional heart care, our providers are all part of one team.  This team includes your primary Cardiologist (physician) and Advanced Practice Providers or APPs (Physician Assistants and Nurse Practitioners) who all work together to provide you with the care you need, when you need it.  Your next appointment:   6 months  Provider:   You will see one of the following Advanced Practice Providers on your designated  Care Team:   Charlies Arthur, [place] Ozell Jodie Passey, PA-C Suzann Riddle, NP Daphne Barrack, NP Artist Pouch, PA-C

## 2024-07-26 ENCOUNTER — Other Ambulatory Visit: Payer: Self-pay

## 2024-07-29 ENCOUNTER — Ambulatory Visit (INDEPENDENT_AMBULATORY_CARE_PROVIDER_SITE_OTHER)

## 2024-07-29 DIAGNOSIS — Z23 Encounter for immunization: Secondary | ICD-10-CM

## 2024-08-07 ENCOUNTER — Encounter: Payer: Self-pay | Admitting: Cardiology

## 2024-08-07 ENCOUNTER — Ambulatory Visit: Attending: Cardiology | Admitting: Cardiology

## 2024-08-07 VITALS — BP 126/68 | HR 90 | Ht 60.0 in | Wt 126.0 lb

## 2024-08-07 DIAGNOSIS — I5022 Chronic systolic (congestive) heart failure: Secondary | ICD-10-CM

## 2024-08-07 DIAGNOSIS — I480 Paroxysmal atrial fibrillation: Secondary | ICD-10-CM

## 2024-08-07 DIAGNOSIS — I25119 Atherosclerotic heart disease of native coronary artery with unspecified angina pectoris: Secondary | ICD-10-CM

## 2024-08-07 DIAGNOSIS — I493 Ventricular premature depolarization: Secondary | ICD-10-CM

## 2024-08-07 NOTE — Progress Notes (Signed)
 Cardiology Office Note  Date: 08/07/2024   ID: [name], DOB 1961/05/28, MRN [id number]  History of Present Illness: [name] is a 63 y.o. female last seen in July by [name] PA-C, I reviewed her note.  She had a recent visit with Dr. Inocencio as well.  She is here for a routine visit.  Reports no exertional chest pain or regular sense of palpitations, no sudden syncope.  Stable NYHA class II dyspnea.  She does not report any obvious blood in her stools, stopped taking iron  supplements however due to constipation.  She was started on mexiletine for treatment of frequent PVCs, did not tolerate the medication however and was kept off of it by Dr. Inocencio at visit in October with plan for observation and a repeat echocardiogram.  I reviewed the remainder of her medications, cardiovascular regimen at this point includes only Eliquis 5 mg twice daily and Pravachol  80 mg daily.  Physical Exam: VS:  BP 126/68 (BP Location: Right Arm, Cuff Size: Normal)   Pulse 90   Ht 5' (1.524 m)   Wt 126 lb (57.2 kg)   LMP  (LMP Unknown) Comment: at age 45  SpO2 96%   BMI 24.61 kg/m , BMI Body mass index is 24.61 kg/m.  Wt Readings from Last 3 Encounters:  08/07/24 126 lb (57.2 kg)  07/18/24 125 lb (56.7 kg)  07/15/24 126 lb 12.8 oz (57.5 kg)    General: Patient appears comfortable at rest. HEENT: Conjunctiva and lids normal. Lungs: Clear to auscultation, nonlabored breathing at rest. Cardiac: Regular rate and rhythm with ectopy, no S3 or significant systolic murmur. Extremities: No pitting edema.  ECG:  An ECG dated 07/18/2024 was personally reviewed today and demonstrated:  Sinus tachycardia with PVCs and PACs, nonspecific ST-T changes.  Labwork: 10/26/2023: ALT 12; AST 21 01/29/2024: TSH 2.68 07/03/2024: Hemoglobin 9.3; Platelet Count 534 07/08/2024: BUN 11; Creatinine, Ser 0.83; Potassium 4.3; Sodium 135     Component Value Date/Time   CHOL 182 10/03/2023 0948   TRIG  270.0 (H) 10/03/2023 0948   HDL 31.90 (L) 10/03/2023 0948   CHOLHDL 6 10/03/2023 0948   VLDL 54.0 (H) 10/03/2023 0948   LDLCALC 96 10/03/2023 0948   LDLDIRECT 134.0 10/27/2022 1044   Other Studies Reviewed Today:  No interval cardiac testing for review today.  Assessment and Plan:  1.  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  Not on aspirin  given use of Eliquis.  No angina and stable NYHA class II dyspnea.  Continue Pravachol  80 mg daily.   2.  HFmrEF with history of ischemic cardiomyopathy, LVEF 45 to 50% by echocardiogram in May 2024.  She has allergies/intolerances to Entresto  and Lopressor .  Follow-up echocardiogram planned with history of frequent PVCs (intolerant of mexiletine).  GDMT limited at this time, although relatively stable with no fluid retention and NYHA class II dyspnea.  Could possibly consider trial of SGLT2 inhibitor or Kerendia eventually.   3.  Paroxysmal atrial fibrillation with CHA2DS2-VASc score of 6.  Currently on Eliquis 5 mg twice daily for stroke prophylaxis.  Asymptomatic in terms of palpitations and in sinus rhythm by recent ECG.   4.  Asymptomatic carotid artery disease.  Carotid Dopplers in May showed 50 to 69% bilateral ICA stenosis.  Continue statin therapy.  5.  Mixed hyperlipidemia.  LDL down to 96 in January.  Continue Pravachol  80 mg daily, increased at last visit with  Ms. Johnson RIGGERS in July.  6.  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  She did not tolerate mexiletine (felt weak and dizzy) and has allergy to Lopressor  (swelling/angioedema).  Following with Dr. Inocencio.  Follow-up echocardiogram planned to ensure no worsening LVEF.  Disposition:  Follow up 6 months.  Signed, Jayson JUDITHANN Sierras, M.D., F.A.C.C. [facility]

## 2024-08-07 NOTE — Patient Instructions (Signed)
 Medication Instructions:  Your physician recommends that you continue on your current medications as directed. Please refer to the Current Medication list given to you today.   Labwork: None today  Testing/Procedures: None today  Follow-Up: 6 months  Any Other Special Instructions Will Be Listed Below (If Applicable).  If you need a refill on your cardiac medications before your next appointment, please call your pharmacy.

## 2024-08-09 ENCOUNTER — Other Ambulatory Visit (HOSPITAL_COMMUNITY): Payer: Self-pay

## 2024-08-09 ENCOUNTER — Telehealth: Payer: Self-pay | Admitting: Cardiology

## 2024-08-09 NOTE — Telephone Encounter (Signed)
 Pt c/o medication issue:  1. Name of Medication:  Eliquis  2. How are you currently taking this medication (dosage and times per day)?   3. Are you having a reaction (difficulty breathing--STAT)?   4. What is your medication issue?   Patient says her insurance is restarting her deductible and she cannot afford Eliquis. She says it will cost her $900. She says she has been completely out of medication the past 2 days and would like to discuss her options. Please advise.

## 2024-08-12 ENCOUNTER — Emergency Department (HOSPITAL_COMMUNITY)
Admission: EM | Admit: 2024-08-12 | Discharge: 2024-08-12 | Disposition: A | Discharge status: Home / Self Care | Attending: Emergency Medicine | Admitting: Emergency Medicine

## 2024-08-12 ENCOUNTER — Other Ambulatory Visit: Payer: Self-pay

## 2024-08-12 ENCOUNTER — Other Ambulatory Visit (HOSPITAL_COMMUNITY): Payer: Self-pay

## 2024-08-12 ENCOUNTER — Encounter (HOSPITAL_COMMUNITY): Payer: Self-pay

## 2024-08-12 ENCOUNTER — Emergency Department (HOSPITAL_COMMUNITY)

## 2024-08-12 ENCOUNTER — Telehealth: Payer: Self-pay | Admitting: Gastroenterology

## 2024-08-12 DIAGNOSIS — Z79899 Other long term (current) drug therapy: Secondary | ICD-10-CM | POA: Diagnosis not present

## 2024-08-12 DIAGNOSIS — Z7901 Long term (current) use of anticoagulants: Secondary | ICD-10-CM | POA: Diagnosis not present

## 2024-08-12 DIAGNOSIS — R000 Tachycardia, unspecified: Secondary | ICD-10-CM | POA: Diagnosis not present

## 2024-08-12 DIAGNOSIS — Z8673 Personal history of transient ischemic attack (TIA), and cerebral infarction without residual deficits: Secondary | ICD-10-CM | POA: Diagnosis not present

## 2024-08-12 DIAGNOSIS — I509 Heart failure, unspecified: Secondary | ICD-10-CM | POA: Diagnosis not present

## 2024-08-12 DIAGNOSIS — M549 Dorsalgia, unspecified: Secondary | ICD-10-CM | POA: Insufficient documentation

## 2024-08-12 DIAGNOSIS — Z951 Presence of aortocoronary bypass graft: Secondary | ICD-10-CM | POA: Insufficient documentation

## 2024-08-12 DIAGNOSIS — K5900 Constipation, unspecified: Secondary | ICD-10-CM

## 2024-08-12 DIAGNOSIS — I2510 Atherosclerotic heart disease of native coronary artery without angina pectoris: Secondary | ICD-10-CM | POA: Diagnosis not present

## 2024-08-12 LAB — URINALYSIS, ROUTINE W REFLEX MICROSCOPIC
Bilirubin Urine: NEGATIVE
Glucose, UA: NEGATIVE mg/dL
Hgb urine dipstick: NEGATIVE
Ketones, ur: NEGATIVE mg/dL
Leukocytes,Ua: NEGATIVE
Nitrite: NEGATIVE
Protein, ur: NEGATIVE mg/dL
Specific Gravity, Urine: 1.01 (ref 1.005–1.030)
pH: 6 (ref 5.0–8.0)

## 2024-08-12 LAB — CBC
HCT: 39.5 % (ref 36.0–46.0)
Hemoglobin: 11.5 g/dL — ABNORMAL LOW (ref 12.0–15.0)
MCH: 25.2 pg — ABNORMAL LOW (ref 26.0–34.0)
MCHC: 29.1 g/dL — ABNORMAL LOW (ref 30.0–36.0)
MCV: 86.6 fL (ref 80.0–100.0)
Platelets: 487 K/uL — ABNORMAL HIGH (ref 150–400)
RBC: 4.56 MIL/uL (ref 3.87–5.11)
RDW: 16.7 % — ABNORMAL HIGH (ref 11.5–15.5)
WBC: 13.7 K/uL — ABNORMAL HIGH (ref 4.0–10.5)
nRBC: 0 % (ref 0.0–0.2)

## 2024-08-12 LAB — COMPREHENSIVE METABOLIC PANEL WITH GFR
ALT: 21 U/L (ref 0–44)
AST: 25 U/L (ref 15–41)
Albumin: 3.6 g/dL (ref 3.5–5.0)
Alkaline Phosphatase: 67 U/L (ref 38–126)
Anion gap: 13 (ref 5–15)
BUN: 10 mg/dL (ref 8–23)
CO2: 25 mmol/L (ref 22–32)
Calcium: 9.1 mg/dL (ref 8.9–10.3)
Chloride: 102 mmol/L (ref 98–111)
Creatinine, Ser: 0.7 mg/dL (ref 0.44–1.00)
GFR, Estimated: >60 mL/min (ref >60)
Glucose, Bld: 113 mg/dL — ABNORMAL HIGH (ref 70–99)
Potassium: 4 mmol/L (ref 3.5–5.1)
Sodium: 140 mmol/L (ref 135–145)
Total Bilirubin: 0.3 mg/dL (ref 0.0–1.2)
Total Protein: 7.1 g/dL (ref 6.5–8.1)

## 2024-08-12 LAB — LIPASE, BLOOD: Lipase: 144 U/L — ABNORMAL HIGH (ref 11–51)

## 2024-08-12 LAB — TROPONIN I (HIGH SENSITIVITY)
Troponin I (High Sensitivity): 22 ng/L — ABNORMAL HIGH (ref <18)
Troponin I (High Sensitivity): 30 ng/L — ABNORMAL HIGH (ref <18)

## 2024-08-12 MED ORDER — FENTANYL CITRATE (PF) 50 MCG/ML IJ SOSY
100.0000 ug | PREFILLED_SYRINGE | Freq: Once | INTRAMUSCULAR | Status: DC
Start: 2024-08-12 — End: 2024-08-12

## 2024-08-12 MED ORDER — DABIGATRAN ETEXILATE MESYLATE 150 MG PO CAPS: 150.0000 mg | ORAL_CAPSULE | Freq: Two times a day (BID) | ORAL | 6 refills | Status: AC

## 2024-08-12 MED ORDER — OXYCODONE HCL 5 MG PO TABS
5.0000 mg | ORAL_TABLET | Freq: Once | ORAL | Status: AC
  Administered 2024-08-12: 5 mg via ORAL
  Filled 2024-08-12: qty 1

## 2024-08-12 MED ORDER — IOHEXOL 350 MG/ML SOLN
100.0000 mL | Freq: Once | INTRAVENOUS | Status: AC | PRN
  Administered 2024-08-12: 100 mL via INTRAVENOUS

## 2024-08-12 MED ORDER — SODIUM CHLORIDE 0.9 % IV BOLUS
500.0000 mL | Freq: Once | INTRAVENOUS | Status: AC
  Administered 2024-08-12: 500 mL via INTRAVENOUS

## 2024-08-12 MED ORDER — FENTANYL CITRATE (PF) 50 MCG/ML IJ SOSY
50.0000 ug | PREFILLED_SYRINGE | Freq: Once | INTRAMUSCULAR | Status: AC
  Administered 2024-08-12: 50 ug via INTRAVENOUS
  Filled 2024-08-12: qty 1

## 2024-08-12 MED ORDER — ONDANSETRON HCL 4 MG/2ML IJ SOLN
4.0000 mg | Freq: Once | INTRAMUSCULAR | Status: AC
  Administered 2024-08-12: 4 mg via INTRAVENOUS
  Filled 2024-08-12: qty 2

## 2024-08-12 MED ORDER — SODIUM CHLORIDE 0.9 % IV BOLUS: 1000.0000 mL | Freq: Once | INTRAVENOUS | Status: DC

## 2024-08-12 NOTE — Telephone Encounter (Signed)
 I spoke with spouse and he requests Pradaxa prescription be sent to [facility] [place] Mr.Garlow tells me Rosann has not had Eliquis in 3 days. They are still in th ED at [facility].

## 2024-08-12 NOTE — ED Notes (Signed)
Meal and water provided to pt.

## 2024-08-12 NOTE — ED Provider Notes (Signed)
 [facility] AT [facility] Provider Note   CSN: [id number] Arrival date & time: 08/12/24  9278     Patient presents with: Pancreatitis  HPI [name] is a 63 y.o. female with CAD s/p CABG x 2, AAA, stroke, CHF presenting for back pain.  Started about a week ago and much worse this morning and woke her from sleep around 3 AM.  The pain is in the center of the lower to mid back and extends to the right.  It is sharp pain not necessarily worse with movement.  Feels that at times the pain radiates into her abdomen.  Feels like a sharp pain.  Denies nausea vomiting diarrhea.  Does report she has not had a good bowel movement in 3 days.  Has tried a Fleet enema with no success.   HPI     Prior to Admission medications   Medication Sig Start Date End Date Taking? Authorizing Provider  apixaban (ELIQUIS) 5 MG TABS tablet Take 1 tablet (5 mg total) by mouth 2 (two) times daily. 07/08/24   [name], MD  Cyanocobalamin  (VITAMIN B-12 CR PO) Take by mouth.    [provider]  Ferrous Sulfate  Dried (FERROUS SULFATE  IRON  PO) Take by mouth. Patient taking differently: Take by mouth. Takes every other day    [provider]  FOLIC ACID  PO Take by mouth.    [provider]  HYDROcodone -acetaminophen  (NORCO/VICODIN) 5-325 MG tablet Take 1 tablet by mouth every 12 (twelve) hours. 07/15/24   [name], [name], DO  HYDROcodone -acetaminophen  (NORCO/VICODIN) 5-325 MG tablet Take 1 tablet by mouth every 12 (twelve) hours. 08/12/24   [name], [name], DO  HYDROcodone -acetaminophen  (NORCO/VICODIN) 5-325 MG tablet Take 1 tablet by mouth every 12 (twelve) hours. 09/09/24   [name], [name], DO  levocetirizine (XYZAL ) 5 MG tablet Take 1 tablet (5 mg total) by mouth every evening. 07/15/24   [name], [name], DO  omeprazole  (PRILOSEC) 40 MG capsule Take 1 capsule (40 mg total) by mouth daily. 12/25/23   Mansouraty, Aloha Raddle., MD  pravastatin   (PRAVACHOL ) 80 MG tablet Take 1 tablet (80 mg total) by mouth every evening. 04/02/24 08/07/24  Johnson Laymon HERO, PA-C  triamcinolone  cream (KENALOG ) 0.1 % Apply 1 Application topically 2 (two) times daily. Patient taking differently: Apply 1 Application topically as needed. 06/20/20   [provider]    Allergies: Hydromorphone , Lopressor  [metoprolol  tartrate], Sacubitril -valsartan , Allegra  allergy [fexofenadine  hcl], Fexofenadine , Lasix  [furosemide ], Nsaids, Gabapentin , Gabapentin  (once-daily), and Lipitor  [atorvastatin ]    Review of Systems See HPI  Updated Vital Signs BP 136/76   Pulse 99   Temp (!) 97.5 F (36.4 C) (Oral)   Resp 16   Ht 5' (1.524 m)   Wt 57.6 kg   LMP  (LMP Unknown) Comment: at age 63  SpO2 93%   BMI 24.80 kg/m   Physical Exam Vitals and nursing note reviewed.  HENT:     Head: Normocephalic and atraumatic.     Mouth/Throat:     Mouth: Mucous membranes are moist.  Eyes:     General:        Right eye: No discharge.        Left eye: No discharge.     Conjunctiva/sclera: Conjunctivae normal.  Cardiovascular:     Rate and Rhythm: Normal rate and regular rhythm.     Pulses: Normal pulses.     Heart sounds: Normal heart sounds.  Pulmonary:     Effort:  Pulmonary effort is normal.     Breath sounds: Normal breath sounds.  Abdominal:     General: Abdomen is flat. There is no distension.     Palpations: Abdomen is soft.     Tenderness: There is no abdominal tenderness.  Musculoskeletal:     Thoracic back: Normal.     Lumbar back: Normal.     Comments: Able to actively flex and extend her back without pain.  Back is atraumatic.  No erythema or edema noted.  No spinous tenderness.  Skin:    General: Skin is warm and dry.  Neurological:     General: No focal deficit present.  Psychiatric:        Mood and Affect: Mood normal.     (all labs ordered are listed, but only abnormal results are displayed) Labs Reviewed  LIPASE, BLOOD - Abnormal;  Notable for the following components:      Result Value   Lipase 144 (*)    All other components within normal limits  COMPREHENSIVE METABOLIC PANEL WITH GFR - Abnormal; Notable for the following components:   Glucose, Bld 113 (*)    All other components within normal limits  CBC - Abnormal; Notable for the following components:   WBC 13.7 (*)    Hemoglobin 11.5 (*)    MCH 25.2 (*)    MCHC 29.1 (*)    RDW 16.7 (*)    Platelets 487 (*)    All other components within normal limits  TROPONIN I (HIGH SENSITIVITY) - Abnormal; Notable for the following components:   Troponin I (High Sensitivity) 22 (*)    All other components within normal limits  TROPONIN I (HIGH SENSITIVITY) - Abnormal; Notable for the following components:   Troponin I (High Sensitivity) 30 (*)    All other components within normal limits  URINALYSIS, ROUTINE W REFLEX MICROSCOPIC    EKG: EKG Interpretation Date/Time:  Monday August 12 2024 11:38:54 EST Ventricular Rate:  106 PR Interval:  157 QRS Duration:  85 QT Interval:  335 QTC Calculation: 445 R Axis:   -2  Text Interpretation: Sinus tachycardia Paired ventricular premature complexes Borderline ST depression, anterolateral leads Confirmed by Levander Houston [phone]) on 08/12/2024 2:25:06 PM  Radiology: CT Angio Chest/Abd/Pel for Dissection W and/or W/WO Result Date: 08/12/2024 EXAM: CTA CHEST, ABDOMEN AND PELVIS WITHOUT AND WITH CONTRAST 08/12/2024 12:31:33 PM TECHNIQUE: CTA of the chest was performed without and with the administration of intravenous contrast. CTA of the abdomen and pelvis was performed without and with the administration of intravenous contrast. Multiplanar reformatted images are provided for review. MIP images are provided for review. Automated exposure control, iterative reconstruction, and/or weight based adjustment of the mA/kV was utilized to reduce the radiation dose to as low as reasonably achievable. COMPARISON: MRI 07/05/2022.  CLINICAL HISTORY: Acute aortic syndrome (AAS) suspected. FINDINGS: VASCULATURE: AORTA: Post-CABG anatomy. Noncontrast imaging demonstrates no intramural hematoma within the thoracic aorta. Contrast phase demonstrates no aortic dissection or aneurysm. There is mild dilatation of the infrarenal abdominal aorta to 3.6 cm. There is nonocclusive thrombus within the expanded aorta. PULMONARY ARTERIES: No evidence of acute pulmonary embolism. GREAT VESSELS OF AORTIC ARCH: No acute finding. No dissection. No arterial occlusion or significant stenosis. CELIAC TRUNK: No acute finding. No occlusion or significant stenosis. SUPERIOR MESENTERIC ARTERY: No acute finding. No occlusion or significant stenosis. INFERIOR MESENTERIC ARTERY: No acute finding. No occlusion or significant stenosis. RENAL ARTERIES: No acute finding. No occlusion or significant stenosis. ILIAC ARTERIES: No acute  finding. No occlusion or significant stenosis. CHEST: MEDIASTINUM: Post-CABG anatomy. No mediastinal lymphadenopathy. The heart and pericardium demonstrate no acute abnormality. LUNGS AND PLEURA: The lungs are without acute process. No focal consolidation or pulmonary edema. No pulmonary infarction. No pneumonia. No pleural fluid. No pneumothorax. THORACIC BONES AND SOFT TISSUES: No acute bone or soft tissue abnormality. ABDOMEN AND PELVIS: LIVER: The liver is unremarkable. GALLBLADDER AND BILE DUCTS: Gallbladder is unremarkable. No biliary ductal dilatation. Common bile duct is normal caliber. SPLEEN: The spleen is unremarkable. PANCREAS: There is mild dilatation of the pancreatic duct which has not changed from the previous exam. Cystic lesion in the head of the pancreas is also not changed from comparison MRI 07/05/2022 measuring 15 mm compared to 15 mm. ADRENAL GLANDS: Bilateral adrenal glands demonstrate no acute abnormality. KIDNEYS, URETERS AND BLADDER: No stones in the kidneys or ureters. No hydronephrosis. No perinephric or periureteral  stranding. Urinary bladder is unremarkable. GI AND BOWEL: Stomach and duodenal sweep demonstrate no acute abnormality. There is no bowel obstruction. No abnormal bowel wall thickening or distension. REPRODUCTIVE: Uterus and adnexa are normal. PERITONEUM AND RETROPERITONEUM: No ascites or free air. LYMPH NODES: No lymphadenopathy. ABDOMINAL BONES AND SOFT TISSUES: No acute abnormality of the bones. No acute soft tissue abnormality. IMPRESSION: 1. No evidence of acute aortic syndrome, including dissection or intramural hematoma. 2. Infrarenal abdominal aortic aneurysm measuring 3.6 cm with nonocclusive mural thrombus; recommend ultrasound follow-up in 3 years. 3. Stable findings of chronic pancreatitis. Electronically signed by: Norleen Boxer MD 08/12/2024 01:49 PM EST RP Workstation: [id number]   DG Chest Port 1 View Result Date: 08/12/2024 EXAM: 1 VIEW(S) XRAY OF THE CHEST 08/12/2024 12:08:23 PM COMPARISON: Comparison is 01/14/2023. CLINICAL HISTORY: cp cp FINDINGS: LINES, TUBES AND DEVICES: Multiple wires and leads project over the chest on the frontal radiograph. Loop recorder. LUNGS AND PLEURA: No focal pulmonary opacity. No pleural effusion. No pneumothorax. HEART AND MEDIASTINUM: Mild cardiomegaly. BONES AND SOFT TISSUES: Median sternotomy for a CABG. Surgical clips project over the right axilla / scapula. IMPRESSION: 1. No acute cardiopulmonary findings 2. Mild cardiomegaly Electronically signed by: Rockey Kilts MD 08/12/2024 01:13 PM EST RP Workstation: [id number]     Procedures   Medications Ordered in the ED  sodium chloride  0.9 % bolus 500 mL (has no administration in time range)  ondansetron  (ZOFRAN ) injection 4 mg (4 mg Intravenous Given 08/12/24 1206)  fentaNYL  (SUBLIMAZE ) injection 50 mcg (50 mcg Intravenous Given 08/12/24 1206)  sodium chloride  0.9 % bolus 500 mL (0 mLs Intravenous Stopped 08/12/24 1502)  iohexol  (OMNIPAQUE ) 350 MG/ML injection 100 mL (100 mLs Intravenous Contrast Given  08/12/24 1232)    Clinical Course as of 08/12/24 1532  Mon Aug 12, 2024  1508 S, needs 2nd trop, mid thoracic back pain, dissection workup negative, stable AAA, if 2nd trop is down trending, if up then consult cards and admit for NSTEMI. Pain is better. Hx chronic pancreatitis.  []  2nd trop  []  d/c vs admit for NSTEMI  [BS]    Clinical Course User Index [BS] Arlee Katz, MD                                 Medical Decision Making Amount and/or Complexity of Data Reviewed Labs: ordered. Radiology: ordered.  Risk Prescription drug management.   Initial Impression and Ddx 63 year old well-appearing female presenting for midthoracic back pain.  Exam is unremarkable.  DDx includes acute pancreatitis, aortic dissection,  ACS, PE, other. Patient PMH that increases complexity of ED encounter:  CAD s/p CABG x 2, AAA, stroke, CHF   Interpretation of Diagnostics - I independent reviewed and interpreted the labs as followed: Lipase 144, initial troponin 22, white blood cell count 13.7  - I independently visualized the following imaging with scope of interpretation limited to determining acute life threatening conditions related to emergency care: CT angio chest/ab/pelvis, which revealed no acute findings, stable abdominal aortic aneurysm, findings suggestive of chronic pancreatitis  Patient Reassessment and Ultimate Disposition/Management Pain improved but still somewhat tachycardic.  Ordered another 500 mL of normal saline.  Second troponin is pending.  If second troponin is flat or downtrending and pain is well-controlled anticipate discharge.  Otherwise may need admission and/or cardiology consult for NSTEMI evaluation.  Signed out patient to oncoming ED resident Dr. Morene Barrio.  Patient management required discussion with the following services or consulting groups:  None  Complexity of Problems Addressed Acute complicated illness or Injury  Additional Data Reviewed and  Analyzed Further history obtained from: Past medical history and medications listed in the EMR and Prior ED visit notes  Patient Encounter Risk Assessment Consideration of hospitalization      Final diagnoses:  Back pain, unspecified back location, unspecified back pain laterality, unspecified chronicity    ED Discharge Orders     None          Lang Norleen POUR, PA-C 08/12/24 1532    Levander Houston, MD 08/12/24 1538

## 2024-08-12 NOTE — ED Notes (Signed)
 Applesauce and water tolerated. Denies nausea

## 2024-08-12 NOTE — Telephone Encounter (Signed)
 Patient currently at [facility] ED for pancreatitis, I will reach out to them later.

## 2024-08-12 NOTE — Telephone Encounter (Signed)
 Patient called stating she is currently at [facility] for pancreatitis flare up that is very painful. States there are no beds available. Patient is requesting to know if she should go to [facility] or [facility] instead. Please advise, thank you.

## 2024-08-12 NOTE — Telephone Encounter (Signed)
 I spoke with husband and they wish to try Pradaxa.

## 2024-08-12 NOTE — Telephone Encounter (Signed)
 The pt states that she is at [facility] for pancreatitis and was told that there are no beds available. She is asking what she should do. I have advised that she follow the instructions per the hospital.  She states  I do not want to be here for 2 days then hung up.

## 2024-08-12 NOTE — ED Triage Notes (Addendum)
 Pt came in via POV d/t Hx of pancreatitis (per pt). Reports the last 3-4 days has been having severe-burning pain in her mid-lower back that has been worse since 0330 this AM. States that she has been admitted at least 11 times for her pancreatitis. Also reports trouble having a BM past 3 days, no luck with a Fleets enema either. A/Ox4, rates her pain 10/10 during triage.

## 2024-08-12 NOTE — ED Provider Notes (Signed)
  Physical Exam  BP (!) 148/96   Pulse (!) 101   Temp (!) 97.5 F (36.4 C) (Oral)   Resp 14   Ht 5' (1.524 m)   Wt 57.6 kg   LMP  (LMP Unknown) Comment: at age 63  SpO2 92%   BMI 24.80 kg/m   Physical Exam Vitals and nursing note reviewed.  Constitutional:      General: She is not in acute distress.    Appearance: She is well-developed.  HENT:     Head: Normocephalic and atraumatic.  Eyes:     Conjunctiva/sclera: Conjunctivae normal.  Cardiovascular:     Rate and Rhythm: Normal rate and regular rhythm.     Heart sounds: No murmur heard. Pulmonary:     Effort: Pulmonary effort is normal. No respiratory distress.     Breath sounds: Normal breath sounds.  Abdominal:     Palpations: Abdomen is soft.     Tenderness: There is no abdominal tenderness.  Musculoskeletal:        General: No swelling.     Cervical back: Neck supple.  Skin:    General: Skin is warm and dry.     Capillary Refill: Capillary refill takes less than 2 seconds.  Neurological:     Mental Status: She is alert.  Psychiatric:        Mood and Affect: Mood normal.     ED Course / MDM   Clinical Course as of 08/12/24 1840  Mon Aug 12, 2024  1508 S, needs 2nd trop, mid thoracic back pain, dissection workup negative, stable AAA, if 2nd trop is down trending, if up then consult cards and admit for NSTEMI. Pain is better. Hx chronic pancreatitis.  []  2nd trop  []  d/c vs admit for NSTEMI  [BS]    Clinical Course User Index [BS] Arlee Katz, MD   Medical Decision Making Amount and/or Complexity of Data Reviewed Labs: ordered. Radiology: ordered.  Risk Prescription drug management.   Repeat evaluation patient is nontender to palpation.  Patient is mildly tachycardic, therefore provided more fluids, heart rate improved into the 80s.  Pain has significantly improved, and has remained nontender to palpation.  Started developing worsening abdominal pain, however continues to have none tenderness to  palpation, this is likely in the setting of constipation.  Will provide recommendations to take MiraLAX  at home, as patient has stopped taking MiraLAX .  She does endorse that she has not had a BM in 3 days, denies any blood in stool.  Hemoglobin remains slightly elevated from prior, is currently taking iron  pills however states that she has reduced her iron  pills to 1 a day as she feels that the 2 a day is making her more constipated.  Overall, patient is well-appearing, well-hydrated, hemodynamically stable, afebrile, and significantly improved with abdominal pain.  Low suspicion for PE versus aortic dissection versus unstable AAA versus ACS versus NSTEMI versus acute pancreatitis.  Patient does have evidence of chronic pancreatitis on imaging, however remained stable, lipase at 144 and remained stable.  Overall, patient is stable for discharge.  Provided strict return precautions to ED.       Arlee Katz, MD 08/12/24 1842    Ellouise Richerd POUR, DO 08/12/24 2154

## 2024-08-12 NOTE — Telephone Encounter (Signed)
 I spoke with PharmD and patient is not eligible for assistance due to her income, recommended she try Pradaxa with goodrx card or warfarin  Please advise.

## 2024-08-14 NOTE — Telephone Encounter (Signed)
 Patient called stating she was recently released from the hospital and would like to speak with Patty. Please advise, thank you

## 2024-08-14 NOTE — Telephone Encounter (Signed)
 The pt was seen in the ED on 11/17 and was told that she has chronic pancreatitis and sent her home.  She would like Dr Wilhelmenia to please review and make recommendations if any.

## 2024-08-14 NOTE — Telephone Encounter (Signed)
 I have reviewed the chart. Recurrent Acute on Chronic Pancreatitis with history of previous PD stenosis post ERCP and dilation in the past. Previously she has wanted to only have ERCPs at [facility]. I can offer her Pancreatic ERCP, with the persisting ductal dilation of the pancreatic duct to see if there is need for repeat dilation as a possible etiology for her recurrent pancreatitis. As well, her chronic pancreatitis changes, will just pose her a risk of recurrent pancreatitis as well, so ERCP may/may not be helpful. If she is open to scheduling ERCP, then I would offer this to her in the next 4-8 weeks (to allow time post this most recent ED evaluation). Let me know what she decides. If she decides to move forward with scheduling great. If she wants a clinic visit, then you can offer her ERCP and get a clinic visit with me if necessary as well. Thanks. GM

## 2024-08-16 NOTE — Telephone Encounter (Signed)
 The pt has been advised and states she would like to come in and discuss her symptoms and next steps in the office. Appt made for 10/02/24 with GM. She will call if needed in the meantime

## 2024-08-19 ENCOUNTER — Other Ambulatory Visit (HOSPITAL_COMMUNITY): Payer: Self-pay

## 2024-08-19 ENCOUNTER — Telehealth: Payer: Self-pay

## 2024-08-19 NOTE — Telephone Encounter (Signed)
 Pharmacy Patient Advocate Encounter   Received notification from Onbase that prior authorization for HYDROcodone -Acetaminophen  5-325MG  tablets  is required/requested.   Insurance verification completed.   The patient is insured through [employer] .   Per test claim: PA required; PA submitted to above mentioned insurance via Latent Key/confirmation #[id number] Status is pending

## 2024-08-20 NOTE — Telephone Encounter (Signed)
 Pt has appt tomorrow, 11/26

## 2024-08-20 NOTE — Telephone Encounter (Signed)
 Make patient aware of denial.  I believe this is a repeat denial.  I believe patient is paying out-of-pocket for this medication.

## 2024-08-20 NOTE — Telephone Encounter (Signed)
 Pharmacy Patient Advocate Encounter  Received notification from [employer]  that Prior Authorization for HYDROcodone -Acetaminophen  5-325MG  tabletshas been DENIED.  See denial reason below. No denial letter attached in CMM. Will attach denial letter to Media tab once received.  Why did we deny your request? We denied the medical services/items listed above because: Opioid Containing Products criteria has not been met. To meet our criteria, your doctor must tell us :  Provide a reason why you need more than a 7 day supply of this drug.  You have tried both non-drug treatment (like behavioral or physical therapy). o You have tried drugs that do not contain opioids (examples are acetaminophen  [Tylenol ], non-steroidal anti-inflammatory drugs [NSAIDs] like ibuprofen and naproxen), or antidepressants.  Your signature is on file, indicating the following was discussed with you: mental health conditions, history of substance abuse, the level of risk for opioid abuse, overdose, and other risks at the dose of the drug requested.  Provide a copy of a pain management agreement between you and your prescriber. If you think you meet the missing criteria, please ask your doctor to send us  this information. PA #/Case ID/Reference #: [id number]

## 2024-08-21 ENCOUNTER — Encounter (INDEPENDENT_AMBULATORY_CARE_PROVIDER_SITE_OTHER): Admitting: Family Medicine

## 2024-08-21 NOTE — Progress Notes (Signed)
 error

## 2024-09-05 ENCOUNTER — Ambulatory Visit (HOSPITAL_COMMUNITY)
Admission: RE | Admit: 2024-09-05 | Discharge: 2024-09-05 | Disposition: A | Discharge status: Home / Self Care | Source: Ambulatory Visit | Attending: Internal Medicine | Admitting: Internal Medicine

## 2024-09-05 DIAGNOSIS — I480 Paroxysmal atrial fibrillation: Secondary | ICD-10-CM

## 2024-09-05 DIAGNOSIS — I493 Ventricular premature depolarization: Secondary | ICD-10-CM | POA: Insufficient documentation

## 2024-09-05 LAB — ECHOCARDIOGRAM COMPLETE
Area-P 1/2: 6 cm2
MV M vel: 5.69 m/s
MV Peak grad: 129.5 mmHg
Radius: 0.6 cm
S' Lateral: 4.45 cm

## 2024-09-12 ENCOUNTER — Ambulatory Visit: Payer: Self-pay | Admitting: Cardiology

## 2024-09-17 ENCOUNTER — Ambulatory Visit: Admitting: Family Medicine

## 2024-09-17 ENCOUNTER — Encounter: Payer: Self-pay | Admitting: Family Medicine

## 2024-09-17 VITALS — BP 128/78 | HR 72 | Temp 98.1°F | Wt 131.0 lb

## 2024-09-17 DIAGNOSIS — R051 Acute cough: Secondary | ICD-10-CM

## 2024-09-17 DIAGNOSIS — B9689 Other specified bacterial agents as the cause of diseases classified elsewhere: Secondary | ICD-10-CM | POA: Diagnosis not present

## 2024-09-17 DIAGNOSIS — J329 Chronic sinusitis, unspecified: Secondary | ICD-10-CM

## 2024-09-17 LAB — POC COVID19 BINAXNOW: SARS Coronavirus 2 Ag: NEGATIVE

## 2024-09-17 MED ORDER — BENZONATATE 200 MG PO CAPS: 200.0000 mg | ORAL_CAPSULE | Freq: Two times a day (BID) | ORAL | 0 refills | Status: DC | PRN

## 2024-09-17 MED ORDER — METHYLPREDNISOLONE ACETATE 80 MG/ML IJ SUSP
80.0000 mg | Freq: Once | INTRAMUSCULAR | Status: AC
  Administered 2024-09-17: 80 mg via INTRAMUSCULAR

## 2024-09-17 MED ORDER — CEFDINIR 300 MG PO CAPS: 300.0000 mg | ORAL_CAPSULE | Freq: Two times a day (BID) | ORAL | 0 refills | Status: DC
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
## 2024-09-17 NOTE — Telephone Encounter (Deleted)
 The patient has been notified of the result and verbalized understanding.  All questions (if any) were answered.  Aware she needs an  with Dr Inocencio  Aware scheduling team will contact her with an appointment. Gladis Reena GAILS, RN 09/17/2024 5:00 PM

## 2024-09-17 NOTE — Telephone Encounter (Signed)
Pt returning call, please advise.

## 2024-09-17 NOTE — Patient Instructions (Addendum)
 No follow-ups on file. NO  MEDROL  DOSEPAK - had dizziness with this Oct 2025.  You have done ok with prednisone  in the past.         Great to see you today.  I have refilled the medication(s) we provide.   If labs were collected or images ordered, we will inform you of  results once we have received them and reviewed. We will contact you either by echart message, or telephone call.  Please give ample time to the testing facility, and our office to run,  receive and review results. Please do not call inquiring of results, even if you can see them in your chart. We will contact you as soon as we are able. If it has been over 1 week since the test was completed, and you have not yet heard from us , then please call us .    - echart message- for normal results that have been seen by the patient already.   - telephone call: abnormal results or if patient has not viewed results in their echart.  If a referral to a specialist was entered for you, please call us  in 2 weeks if you have not heard from the specialist office to schedule.

## 2024-09-30 ENCOUNTER — Ambulatory Visit (INDEPENDENT_AMBULATORY_CARE_PROVIDER_SITE_OTHER): Admitting: Family Medicine

## 2024-09-30 ENCOUNTER — Encounter: Payer: Self-pay | Admitting: Family Medicine

## 2024-09-30 VITALS — BP 126/74 | HR 105 | Temp 98.3°F | Ht 60.0 in | Wt 132.2 lb

## 2024-09-30 DIAGNOSIS — I714 Abdominal aortic aneurysm, without rupture, unspecified: Secondary | ICD-10-CM

## 2024-09-30 DIAGNOSIS — Z8673 Personal history of transient ischemic attack (TIA), and cerebral infarction without residual deficits: Secondary | ICD-10-CM | POA: Diagnosis not present

## 2024-09-30 DIAGNOSIS — I429 Cardiomyopathy, unspecified: Secondary | ICD-10-CM | POA: Diagnosis not present

## 2024-09-30 DIAGNOSIS — Z0000 Encounter for general adult medical examination without abnormal findings: Secondary | ICD-10-CM | POA: Diagnosis not present

## 2024-09-30 DIAGNOSIS — M170 Bilateral primary osteoarthritis of knee: Secondary | ICD-10-CM

## 2024-09-30 DIAGNOSIS — I25810 Atherosclerosis of coronary artery bypass graft(s) without angina pectoris: Secondary | ICD-10-CM

## 2024-09-30 DIAGNOSIS — I480 Paroxysmal atrial fibrillation: Secondary | ICD-10-CM

## 2024-09-30 DIAGNOSIS — M8589 Other specified disorders of bone density and structure, multiple sites: Secondary | ICD-10-CM

## 2024-09-30 DIAGNOSIS — E785 Hyperlipidemia, unspecified: Secondary | ICD-10-CM

## 2024-09-30 DIAGNOSIS — I700 Atherosclerosis of aorta: Secondary | ICD-10-CM

## 2024-09-30 DIAGNOSIS — Z1231 Encounter for screening mammogram for malignant neoplasm of breast: Secondary | ICD-10-CM

## 2024-09-30 DIAGNOSIS — G8929 Other chronic pain: Secondary | ICD-10-CM | POA: Diagnosis not present

## 2024-09-30 DIAGNOSIS — I252 Old myocardial infarction: Secondary | ICD-10-CM

## 2024-09-30 DIAGNOSIS — G8110 Spastic hemiplegia affecting unspecified side: Secondary | ICD-10-CM

## 2024-09-30 DIAGNOSIS — I5032 Chronic diastolic (congestive) heart failure: Secondary | ICD-10-CM

## 2024-09-30 DIAGNOSIS — R269 Unspecified abnormalities of gait and mobility: Secondary | ICD-10-CM

## 2024-09-30 DIAGNOSIS — Z7901 Long term (current) use of anticoagulants: Secondary | ICD-10-CM

## 2024-09-30 DIAGNOSIS — I509 Heart failure, unspecified: Secondary | ICD-10-CM | POA: Diagnosis not present

## 2024-09-30 DIAGNOSIS — D500 Iron deficiency anemia secondary to blood loss (chronic): Secondary | ICD-10-CM | POA: Diagnosis not present

## 2024-09-30 DIAGNOSIS — Z131 Encounter for screening for diabetes mellitus: Secondary | ICD-10-CM

## 2024-09-30 DIAGNOSIS — Z951 Presence of aortocoronary bypass graft: Secondary | ICD-10-CM

## 2024-09-30 DIAGNOSIS — M25562 Pain in left knee: Secondary | ICD-10-CM

## 2024-09-30 DIAGNOSIS — R52 Pain, unspecified: Secondary | ICD-10-CM

## 2024-09-30 LAB — COMPREHENSIVE METABOLIC PANEL WITH GFR
ALT: 13 U/L (ref 3–35)
AST: 21 U/L (ref 5–37)
Albumin: 4.3 g/dL (ref 3.5–5.2)
Alkaline Phosphatase: 76 U/L (ref 39–117)
BUN: 15 mg/dL (ref 6–23)
CO2: 31 meq/L (ref 19–32)
Calcium: 9.6 mg/dL (ref 8.4–10.5)
Chloride: 102 meq/L (ref 96–112)
Creatinine, Ser: 0.93 mg/dL (ref 0.40–1.20)
GFR: 64.99 mL/min
Glucose, Bld: 92 mg/dL (ref 70–99)
Potassium: 4.9 meq/L (ref 3.5–5.1)
Sodium: 139 meq/L (ref 135–145)
Total Bilirubin: 0.2 mg/dL (ref 0.2–1.2)
Total Protein: 7.2 g/dL (ref 6.0–8.3)

## 2024-09-30 LAB — LIPID PANEL
Cholesterol: 199 mg/dL (ref 28–200)
HDL: 53.6 mg/dL
LDL Cholesterol: 96 mg/dL (ref 10–99)
NonHDL: 145.57
Total CHOL/HDL Ratio: 4
Triglycerides: 250 mg/dL — ABNORMAL HIGH (ref 10.0–149.0)
VLDL: 50 mg/dL — ABNORMAL HIGH (ref 0.0–40.0)

## 2024-09-30 LAB — TSH: TSH: 4.48 u[IU]/mL (ref 0.35–5.50)

## 2024-09-30 LAB — IBC + FERRITIN
Ferritin: 16.5 ng/mL (ref 10.0–291.0)
Iron: 326 ug/dL — ABNORMAL HIGH (ref 42–145)
Saturation Ratios: 96.2 % — ABNORMAL HIGH (ref 20.0–50.0)
TIBC: 338.8 ug/dL (ref 250.0–450.0)
Transferrin: 242 mg/dL (ref 212.0–360.0)

## 2024-09-30 LAB — CBC
HCT: 36 % (ref 36.0–46.0)
Hemoglobin: 11.3 g/dL — ABNORMAL LOW (ref 12.0–15.0)
MCHC: 31.4 g/dL (ref 30.0–36.0)
MCV: 78 fl (ref 78.0–100.0)
Platelets: 483 K/uL — ABNORMAL HIGH (ref 150.0–400.0)
RBC: 4.61 Mil/uL (ref 3.87–5.11)
RDW: 18.4 % — ABNORMAL HIGH (ref 11.5–15.5)
WBC: 9.4 K/uL (ref 4.0–10.5)

## 2024-09-30 LAB — VITAMIN D 25 HYDROXY (VIT D DEFICIENCY, FRACTURES): VITD: 20.52 ng/mL — ABNORMAL LOW (ref 30.00–100.00)

## 2024-09-30 LAB — HEMOGLOBIN A1C: Hgb A1c MFr Bld: 6.8 % — ABNORMAL HIGH (ref 4.6–6.5)

## 2024-09-30 MED ORDER — HYDROCODONE-ACETAMINOPHEN 5-325 MG PO TABS: 1.0000 | ORAL_TABLET | Freq: Two times a day (BID) | ORAL | 0 refills | Status: AC

## 2024-09-30 NOTE — Patient Instructions (Addendum)
 Return in about 24 weeks (around 03/17/2025) for chronic pain management.        Great to see you today.  I have refilled the medication(s) we provide.   If labs were collected or images ordered, we will inform you of  results once we have received them and reviewed. We will contact you either by echart message, or telephone call.  Please give ample time to the testing facility, and our office to run,  receive and review results. Please do not call inquiring of results, even if you can see them in your chart. We will contact you as soon as we are able. If it has been over 1 week since the test was completed, and you have not yet heard from us , then please call us .    - echart message- for normal results that have been seen by the patient already.   - telephone call: abnormal results or if patient has not viewed results in their echart.  If a referral to a specialist was entered for you, please call us  in 2 weeks if you have not heard from the specialist office to schedule.
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
## 2024-10-01 DIAGNOSIS — E559 Vitamin D deficiency, unspecified: Secondary | ICD-10-CM

## 2024-10-01 DIAGNOSIS — M8589 Other specified disorders of bone density and structure, multiple sites: Secondary | ICD-10-CM

## 2024-10-01 MED ORDER — CHOLECALCIFEROL 50 MCG (2000 UT) PO TABS: 1.0000 | ORAL_TABLET | Freq: Every day | ORAL | 11 refills | Status: AC

## 2024-10-01 NOTE — Telephone Encounter (Signed)
 Please call patient Her iron  levels are actually very high, I would encourage her to stop her iron  supplementation for 4 weeks, then she can start back every other day.  We will retest her iron  at her next follow-up Liver, kidney and thyroid  functions are normal Cholesterol panel is borderline, LDL is in the 90s, for her LDL in the 70s is best.  Make sure she is taking from Pravachol  nightly. Blood cell counts are stable and electrolytes are normal Vitamin D  is extremely low at 20.5-encouraged her to add 2000 units of vitamin D3 to her daily supplement.  I can call this in, but insurance usually does not cover vitamins well.  She may need to purchase this over-the-counter if pharmacy does not fill the prescription.

## 2024-10-02 ENCOUNTER — Telehealth: Payer: Self-pay | Admitting: Gastroenterology

## 2024-10-02 ENCOUNTER — Ambulatory Visit: Admitting: Gastroenterology

## 2024-10-02 LAB — DM TEMPLATE

## 2024-10-02 NOTE — Telephone Encounter (Signed)
 Good morning Dr. Wilhelmenia,    Patient called stating she would not be in to her appointment today at 9:30 because she was not aware of the appointment until late last night.  Patient wanted to reschedule the appointment with you but you did not have any openings.  Will you please advise if patient can schedule with a PA for this appointment?  Thank you.

## 2024-10-02 NOTE — Telephone Encounter (Signed)
 You may ask the patient if she wants to see a PA, usually she only wants to see the MD and that she has had concerns about ERCP needs, she would most likely want to talk with me. You may offer her what ever she is willing to do. Otherwise okay to use an overbook or held slot with me to discuss things further with her in the future. Thanks. GM

## 2024-10-03 LAB — DRUG MONITORING PANEL 376104, URINE
Amphetamines: NEGATIVE ng/mL
Barbiturates: NEGATIVE ng/mL
Benzodiazepines: NEGATIVE ng/mL
Cocaine Metabolite: NEGATIVE ng/mL
Codeine: NEGATIVE ng/mL
Desmethyltramadol: NEGATIVE ng/mL
Hydrocodone: NEGATIVE ng/mL
Hydromorphone: NEGATIVE ng/mL
Morphine: NEGATIVE ng/mL
Norhydrocodone: NEGATIVE ng/mL
Opiates: NEGATIVE ng/mL
Oxycodone: NEGATIVE ng/mL
Tramadol: NEGATIVE ng/mL

## 2024-10-03 LAB — DM TEMPLATE

## 2024-10-04 NOTE — Telephone Encounter (Signed)
 Lvm for patient to let her know we can get her in on 1/21 at 10:50 or 2/17 at 2:30. If she is okay with either one of the days, [facility] will be able to schedule appointment.

## 2024-10-14 ENCOUNTER — Telehealth: Payer: Self-pay | Admitting: Cardiology

## 2024-10-14 ENCOUNTER — Other Ambulatory Visit (HOSPITAL_COMMUNITY): Payer: Self-pay

## 2024-10-14 ENCOUNTER — Telehealth: Payer: Self-pay | Admitting: Pharmacy Technician

## 2024-10-14 NOTE — Telephone Encounter (Signed)
 Pharmacy Patient Advocate Encounter   Received notification from Fax that prior authorization for pradaxa  is required/requested.   Insurance verification completed.   The patient is insured through [employer].   Per test claim: PA required; PA submitted to above mentioned insurance via Latent Key/confirmation #/EOC [id number] Status is pending

## 2024-10-14 NOTE — Telephone Encounter (Signed)
 Pt c/o medication issue:  1. Name of Medication:   dabigatran  (PRADAXA ) 150 MG CAPS capsule    2. How are you currently taking this medication (dosage and times per day)? As written   3. Are you having a reaction (difficulty breathing--STAT)? No   4. What is your medication issue? Pt spouse called in stating needs PA for this med

## 2024-10-14 NOTE — Telephone Encounter (Signed)
 PA request has been Submitted. New Encounter has been or will be created for follow up. For additional info see Pharmacy Prior Auth telephone encounter from 10/14/24.

## 2024-10-17 ENCOUNTER — Ambulatory Visit (HOSPITAL_BASED_OUTPATIENT_CLINIC_OR_DEPARTMENT_OTHER): Admitting: Obstetrics & Gynecology

## 2024-10-17 NOTE — Telephone Encounter (Signed)
 Pharmacy Patient Advocate Encounter  Received notification from [facility] that Prior Authorization for pradaxa  has been APPROVED from 10/16/24 to 10/16/25   PA #/Case ID/Reference #: [id number]

## 2024-10-29 ENCOUNTER — Telehealth: Payer: Self-pay

## 2024-10-29 NOTE — Telephone Encounter (Signed)
 Communication  Reason for CRM: Patient has called to request that her lab results from January be sent in the mail to the address on file.   Labs printed and placed in box up front for mailout. Pt aware and verbalized understanding.

## 2024-10-31 ENCOUNTER — Telehealth: Payer: Self-pay

## 2024-10-31 ENCOUNTER — Telehealth: Payer: Self-pay | Admitting: Oncology

## 2024-10-31 ENCOUNTER — Ambulatory Visit (HOSPITAL_BASED_OUTPATIENT_CLINIC_OR_DEPARTMENT_OTHER): Admitting: Obstetrics & Gynecology

## 2024-10-31 NOTE — Telephone Encounter (Signed)
 Followed up with patient's voicemail to call her. Patient requested to have her next hematology appointment cancelled and to be rescheduled for after July 1st due to impending insurance changes. Patient reported that she will then be on Medicare and [employer] by then. Appointment for February 11th cancelled. Scheduler made aware.

## 2024-10-31 NOTE — Telephone Encounter (Signed)
 I ATTEMPTED TO REACH PATIENT AND EMERGENCY CONTACT TO RESCHEDULE 11/06/2024 LAB AND MD APPOINTMENTS PER PATIENT'S REQUESTS DUE TO INSURANCE. PHONE NUMBERS WERE OUT OF SERVICE. A PRINTED REMINDER OF 04/16/2025 APPOINTMENTS HAVE BEEN MAILED TO ADDRESS ON FILE.

## 2024-11-06 ENCOUNTER — Inpatient Hospital Stay: Payer: Self-pay | Admitting: Oncology

## 2024-11-06 ENCOUNTER — Inpatient Hospital Stay: Payer: Self-pay

## 2024-12-06 ENCOUNTER — Ambulatory Visit: Admitting: Cardiology

## 2025-01-28 ENCOUNTER — Ambulatory Visit: Admitting: Internal Medicine

## 2025-03-27 ENCOUNTER — Ambulatory Visit (HOSPITAL_BASED_OUTPATIENT_CLINIC_OR_DEPARTMENT_OTHER): Admitting: Obstetrics & Gynecology

## 2025-04-16 ENCOUNTER — Inpatient Hospital Stay: Payer: Self-pay | Admitting: Oncology

## 2025-04-16 ENCOUNTER — Inpatient Hospital Stay: Payer: Self-pay | Attending: Oncology
